# Patient Record
Sex: Male | Born: 1955 | Race: White | Hispanic: No | Smoking: Current every day smoker
Health system: Southern US, Community
[De-identification: ages and names within clinical notes are randomized; demographics above are authoritative.]

## PROBLEM LIST (undated history)

## (undated) DIAGNOSIS — I779 Disorder of arteries and arterioles, unspecified: Secondary | ICD-10-CM

## (undated) DIAGNOSIS — R011 Cardiac murmur, unspecified: Secondary | ICD-10-CM

## (undated) DIAGNOSIS — K2270 Barrett's esophagus without dysplasia: Secondary | ICD-10-CM

## (undated) DIAGNOSIS — I5022 Chronic systolic (congestive) heart failure: Secondary | ICD-10-CM

## (undated) DIAGNOSIS — R51 Headache: Secondary | ICD-10-CM

## (undated) DIAGNOSIS — I679 Cerebrovascular disease, unspecified: Secondary | ICD-10-CM

## (undated) DIAGNOSIS — J90 Pleural effusion, not elsewhere classified: Secondary | ICD-10-CM

## (undated) DIAGNOSIS — I739 Peripheral vascular disease, unspecified: Secondary | ICD-10-CM

## (undated) DIAGNOSIS — I255 Ischemic cardiomyopathy: Secondary | ICD-10-CM

## (undated) DIAGNOSIS — R0600 Dyspnea, unspecified: Secondary | ICD-10-CM

## (undated) DIAGNOSIS — Z720 Tobacco use: Secondary | ICD-10-CM

## (undated) DIAGNOSIS — Z95810 Presence of automatic (implantable) cardiac defibrillator: Secondary | ICD-10-CM

## (undated) DIAGNOSIS — R519 Headache, unspecified: Secondary | ICD-10-CM

## (undated) DIAGNOSIS — Z950 Presence of cardiac pacemaker: Secondary | ICD-10-CM

## (undated) DIAGNOSIS — T82198A Other mechanical complication of other cardiac electronic device, initial encounter: Secondary | ICD-10-CM

## (undated) DIAGNOSIS — I10 Essential (primary) hypertension: Secondary | ICD-10-CM

## (undated) DIAGNOSIS — Z87442 Personal history of urinary calculi: Secondary | ICD-10-CM

## (undated) DIAGNOSIS — E785 Hyperlipidemia, unspecified: Secondary | ICD-10-CM

## (undated) DIAGNOSIS — M1990 Unspecified osteoarthritis, unspecified site: Secondary | ICD-10-CM

## (undated) DIAGNOSIS — I2510 Atherosclerotic heart disease of native coronary artery without angina pectoris: Secondary | ICD-10-CM

## (undated) HISTORY — PX: OTHER SURGICAL HISTORY: SHX169

## (undated) HISTORY — DX: Cerebrovascular disease, unspecified: I67.9

## (undated) HISTORY — DX: Ischemic cardiomyopathy: I25.5

## (undated) HISTORY — DX: Tobacco use: Z72.0

## (undated) HISTORY — DX: Chronic systolic (congestive) heart failure: I50.22

## (undated) HISTORY — DX: Barrett's esophagus without dysplasia: K22.70

## (undated) HISTORY — DX: Atherosclerotic heart disease of native coronary artery without angina pectoris: I25.10

## (undated) HISTORY — DX: Hyperlipidemia, unspecified: E78.5

## (undated) HISTORY — DX: Presence of automatic (implantable) cardiac defibrillator: Z95.810

---

## 1997-04-20 ENCOUNTER — Encounter: Admission: RE | Admit: 1997-04-20 | Discharge: 1997-07-19 | Payer: Self-pay | Admitting: *Deleted

## 1998-01-02 HISTORY — PX: APPENDECTOMY: SHX54

## 1998-01-14 ENCOUNTER — Encounter: Payer: Self-pay | Admitting: *Deleted

## 1998-01-14 ENCOUNTER — Encounter: Payer: Self-pay | Admitting: Surgery

## 1998-01-15 ENCOUNTER — Inpatient Hospital Stay (HOSPITAL_COMMUNITY): Admission: EM | Admit: 1998-01-15 | Discharge: 1998-01-19 | Payer: Self-pay | Admitting: Emergency Medicine

## 1999-05-17 ENCOUNTER — Encounter: Admission: RE | Admit: 1999-05-17 | Discharge: 1999-08-15 | Payer: Self-pay | Admitting: Orthopedic Surgery

## 1999-08-16 ENCOUNTER — Encounter: Admission: RE | Admit: 1999-08-16 | Discharge: 1999-11-14 | Payer: Self-pay | Admitting: Orthopedic Surgery

## 1999-11-28 ENCOUNTER — Encounter: Admission: RE | Admit: 1999-11-28 | Discharge: 1999-12-01 | Payer: Self-pay | Admitting: Orthopedic Surgery

## 2002-09-29 ENCOUNTER — Encounter: Payer: Self-pay | Admitting: Emergency Medicine

## 2002-09-29 ENCOUNTER — Emergency Department (HOSPITAL_COMMUNITY): Admission: EM | Admit: 2002-09-29 | Discharge: 2002-09-29 | Payer: Self-pay | Admitting: Emergency Medicine

## 2002-10-01 ENCOUNTER — Encounter: Payer: Self-pay | Admitting: Emergency Medicine

## 2002-10-01 ENCOUNTER — Observation Stay (HOSPITAL_COMMUNITY): Admission: EM | Admit: 2002-10-01 | Discharge: 2002-10-02 | Payer: Self-pay | Admitting: Urology

## 2002-10-01 ENCOUNTER — Emergency Department (HOSPITAL_COMMUNITY): Admission: EM | Admit: 2002-10-01 | Discharge: 2002-10-01 | Payer: Self-pay | Admitting: Emergency Medicine

## 2002-10-01 ENCOUNTER — Ambulatory Visit (HOSPITAL_BASED_OUTPATIENT_CLINIC_OR_DEPARTMENT_OTHER): Admission: RE | Admit: 2002-10-01 | Discharge: 2002-10-01 | Payer: Self-pay | Admitting: Urology

## 2002-10-08 ENCOUNTER — Ambulatory Visit (HOSPITAL_BASED_OUTPATIENT_CLINIC_OR_DEPARTMENT_OTHER): Admission: RE | Admit: 2002-10-08 | Discharge: 2002-10-08 | Payer: Self-pay | Admitting: Urology

## 2003-05-07 ENCOUNTER — Encounter: Payer: Self-pay | Admitting: Emergency Medicine

## 2003-05-08 ENCOUNTER — Inpatient Hospital Stay (HOSPITAL_COMMUNITY): Admission: EM | Admit: 2003-05-08 | Discharge: 2003-05-13 | Payer: Self-pay | Admitting: Cardiology

## 2004-06-10 ENCOUNTER — Ambulatory Visit: Payer: Self-pay

## 2004-06-17 ENCOUNTER — Ambulatory Visit: Payer: Self-pay | Admitting: *Deleted

## 2004-06-17 ENCOUNTER — Inpatient Hospital Stay (HOSPITAL_BASED_OUTPATIENT_CLINIC_OR_DEPARTMENT_OTHER): Admission: RE | Admit: 2004-06-17 | Discharge: 2004-06-17 | Payer: Self-pay | Admitting: *Deleted

## 2004-06-23 ENCOUNTER — Ambulatory Visit: Payer: Self-pay | Admitting: Cardiology

## 2004-12-29 ENCOUNTER — Ambulatory Visit: Payer: Self-pay | Admitting: Cardiology

## 2005-01-05 ENCOUNTER — Ambulatory Visit: Payer: Self-pay

## 2005-01-05 ENCOUNTER — Ambulatory Visit: Payer: Self-pay | Admitting: Cardiology

## 2005-01-25 ENCOUNTER — Ambulatory Visit: Payer: Self-pay | Admitting: Cardiology

## 2005-01-30 ENCOUNTER — Ambulatory Visit: Payer: Self-pay | Admitting: Internal Medicine

## 2005-02-08 ENCOUNTER — Ambulatory Visit: Payer: Self-pay | Admitting: Internal Medicine

## 2005-02-08 ENCOUNTER — Observation Stay (HOSPITAL_COMMUNITY): Admission: AD | Admit: 2005-02-08 | Discharge: 2005-02-09 | Payer: Self-pay | Admitting: Internal Medicine

## 2005-02-22 ENCOUNTER — Ambulatory Visit: Payer: Self-pay

## 2005-03-17 ENCOUNTER — Ambulatory Visit: Payer: Self-pay | Admitting: Cardiology

## 2005-04-14 ENCOUNTER — Ambulatory Visit: Payer: Self-pay | Admitting: Cardiology

## 2005-05-09 ENCOUNTER — Ambulatory Visit: Payer: Self-pay | Admitting: Internal Medicine

## 2005-06-13 ENCOUNTER — Ambulatory Visit: Payer: Self-pay | Admitting: Cardiology

## 2005-12-28 ENCOUNTER — Ambulatory Visit: Payer: Self-pay | Admitting: Cardiology

## 2006-06-26 ENCOUNTER — Ambulatory Visit: Payer: Self-pay | Admitting: Internal Medicine

## 2006-10-31 ENCOUNTER — Ambulatory Visit: Payer: Self-pay | Admitting: Cardiology

## 2007-01-24 ENCOUNTER — Ambulatory Visit: Payer: Self-pay

## 2007-05-07 ENCOUNTER — Ambulatory Visit: Payer: Self-pay | Admitting: Internal Medicine

## 2007-07-19 ENCOUNTER — Ambulatory Visit: Payer: Self-pay | Admitting: Cardiology

## 2007-08-15 ENCOUNTER — Ambulatory Visit: Payer: Self-pay

## 2007-11-11 ENCOUNTER — Ambulatory Visit: Payer: Self-pay

## 2008-01-22 DIAGNOSIS — E785 Hyperlipidemia, unspecified: Secondary | ICD-10-CM | POA: Insufficient documentation

## 2008-01-22 DIAGNOSIS — I5022 Chronic systolic (congestive) heart failure: Secondary | ICD-10-CM

## 2008-03-12 ENCOUNTER — Encounter: Payer: Self-pay | Admitting: Internal Medicine

## 2008-04-10 ENCOUNTER — Encounter: Payer: Self-pay | Admitting: Cardiology

## 2008-04-13 ENCOUNTER — Encounter: Payer: Self-pay | Admitting: Internal Medicine

## 2008-04-13 ENCOUNTER — Ambulatory Visit: Payer: Self-pay

## 2008-05-12 ENCOUNTER — Encounter (INDEPENDENT_AMBULATORY_CARE_PROVIDER_SITE_OTHER): Payer: Self-pay | Admitting: *Deleted

## 2008-05-15 ENCOUNTER — Ambulatory Visit: Payer: Self-pay | Admitting: Cardiology

## 2008-05-15 DIAGNOSIS — I739 Peripheral vascular disease, unspecified: Secondary | ICD-10-CM | POA: Insufficient documentation

## 2008-05-15 DIAGNOSIS — I679 Cerebrovascular disease, unspecified: Secondary | ICD-10-CM | POA: Insufficient documentation

## 2008-05-15 DIAGNOSIS — I2589 Other forms of chronic ischemic heart disease: Secondary | ICD-10-CM

## 2008-05-15 DIAGNOSIS — R079 Chest pain, unspecified: Secondary | ICD-10-CM

## 2008-05-15 DIAGNOSIS — F17200 Nicotine dependence, unspecified, uncomplicated: Secondary | ICD-10-CM | POA: Insufficient documentation

## 2008-05-15 DIAGNOSIS — E119 Type 2 diabetes mellitus without complications: Secondary | ICD-10-CM

## 2008-05-15 LAB — CONVERTED CEMR LAB
AST: 17 units/L (ref 0–37)
BUN: 15 mg/dL (ref 6–23)
CO2: 29 meq/L (ref 19–32)
Calcium: 9.2 mg/dL (ref 8.4–10.5)
Chloride: 103 meq/L (ref 96–112)
Cholesterol, target level: 200 mg/dL
Glucose, Bld: 218 mg/dL — ABNORMAL HIGH (ref 70–99)
HDL goal, serum: 40 mg/dL
LDL Goal: 70 mg/dL
Potassium: 4 meq/L (ref 3.5–5.1)
Sodium: 137 meq/L (ref 135–145)
Total Bilirubin: 0.6 mg/dL (ref 0.3–1.2)

## 2008-05-18 ENCOUNTER — Encounter (INDEPENDENT_AMBULATORY_CARE_PROVIDER_SITE_OTHER): Payer: Self-pay | Admitting: *Deleted

## 2008-06-02 ENCOUNTER — Telehealth (INDEPENDENT_AMBULATORY_CARE_PROVIDER_SITE_OTHER): Payer: Self-pay | Admitting: Radiology

## 2008-06-03 ENCOUNTER — Encounter: Payer: Self-pay | Admitting: Cardiology

## 2008-06-03 ENCOUNTER — Ambulatory Visit: Payer: Self-pay

## 2008-06-25 ENCOUNTER — Ambulatory Visit: Payer: Self-pay

## 2008-06-25 ENCOUNTER — Encounter: Payer: Self-pay | Admitting: Internal Medicine

## 2008-08-27 ENCOUNTER — Encounter (INDEPENDENT_AMBULATORY_CARE_PROVIDER_SITE_OTHER): Payer: Self-pay | Admitting: *Deleted

## 2008-09-25 ENCOUNTER — Telehealth: Payer: Self-pay | Admitting: Cardiology

## 2008-09-29 ENCOUNTER — Ambulatory Visit: Payer: Self-pay | Admitting: Internal Medicine

## 2008-12-16 ENCOUNTER — Ambulatory Visit: Payer: Self-pay

## 2008-12-16 ENCOUNTER — Encounter: Payer: Self-pay | Admitting: Internal Medicine

## 2009-01-20 ENCOUNTER — Encounter: Payer: Self-pay | Admitting: Cardiology

## 2009-01-21 ENCOUNTER — Ambulatory Visit: Payer: Self-pay

## 2009-01-21 ENCOUNTER — Ambulatory Visit: Payer: Self-pay | Admitting: Cardiology

## 2009-01-21 DIAGNOSIS — I2510 Atherosclerotic heart disease of native coronary artery without angina pectoris: Secondary | ICD-10-CM

## 2009-01-27 LAB — CONVERTED CEMR LAB
ALT: 20 units/L (ref 0–53)
Albumin: 4.2 g/dL (ref 3.5–5.2)
BUN: 9 mg/dL (ref 6–23)
CO2: 27 meq/L (ref 19–32)
Calcium: 9.4 mg/dL (ref 8.4–10.5)
Creatinine, Ser: 0.8 mg/dL (ref 0.4–1.5)
Direct LDL: 184.1 mg/dL
GFR calc non Af Amer: 106.97 mL/min (ref >60)
Glucose, Bld: 247 mg/dL — ABNORMAL HIGH (ref 70–99)
HDL: 39.3 mg/dL (ref >39.00)
Potassium: 3.9 meq/L (ref 3.5–5.1)
Total Bilirubin: 0.8 mg/dL (ref 0.3–1.2)
Total CHOL/HDL Ratio: 6
Total Protein: 7.5 g/dL (ref 6.0–8.3)
VLDL: 23.4 mg/dL (ref 0.0–40.0)

## 2009-02-17 ENCOUNTER — Encounter: Payer: Self-pay | Admitting: Cardiology

## 2009-02-17 ENCOUNTER — Ambulatory Visit: Payer: Self-pay

## 2009-03-23 ENCOUNTER — Ambulatory Visit: Payer: Self-pay | Admitting: Internal Medicine

## 2009-03-23 DIAGNOSIS — R42 Dizziness and giddiness: Secondary | ICD-10-CM

## 2009-06-10 ENCOUNTER — Encounter: Payer: Self-pay | Admitting: Internal Medicine

## 2009-06-10 ENCOUNTER — Ambulatory Visit: Payer: Self-pay

## 2009-08-13 ENCOUNTER — Ambulatory Visit: Payer: Self-pay | Admitting: Cardiology

## 2009-08-17 ENCOUNTER — Encounter (INDEPENDENT_AMBULATORY_CARE_PROVIDER_SITE_OTHER): Payer: Self-pay | Admitting: *Deleted

## 2009-08-17 LAB — CONVERTED CEMR LAB
ALT: 11 units/L (ref 0–53)
Albumin: 4.2 g/dL (ref 3.5–5.2)
CO2: 30 meq/L (ref 19–32)
Calcium: 9.6 mg/dL (ref 8.4–10.5)
Direct LDL: 158.4 mg/dL
GFR calc non Af Amer: 120.55 mL/min (ref >60)
Potassium: 4.5 meq/L (ref 3.5–5.1)
Total Bilirubin: 0.5 mg/dL (ref 0.3–1.2)
Total CHOL/HDL Ratio: 6
Triglycerides: 122 mg/dL (ref 0.0–149.0)

## 2009-08-27 ENCOUNTER — Ambulatory Visit: Payer: Self-pay

## 2009-08-27 ENCOUNTER — Encounter: Payer: Self-pay | Admitting: Cardiology

## 2009-09-21 ENCOUNTER — Ambulatory Visit: Payer: Self-pay | Admitting: Internal Medicine

## 2009-12-15 ENCOUNTER — Ambulatory Visit: Payer: Self-pay

## 2009-12-15 ENCOUNTER — Encounter: Payer: Self-pay | Admitting: Internal Medicine

## 2010-02-01 NOTE — Miscellaneous (Signed)
Summary: Orders Update  Clinical Lists Changes  Orders: Added new Test order of Carotid Duplex (Carotid Duplex) - Signed 

## 2010-02-01 NOTE — Assessment & Plan Note (Signed)
Summary: PER CHECKOUT/SF    History of Present Illness: [name] is a  gentleman who has a history of coronary artery disease and ischemic cardiomyopathy as well as a prior ICD. The patient has had previous three-vessel stentingl. His last Myoview was performed in June 2010.Marland Kitchen At that time there was a prior extensive scar in the basal inferior wall, apex and mid/distal anteroseptal wall. There was no ischemia. His ejection fraction was 31%. Last carotid Dopplers were performed in June 2010. At that time he had 60-79% bilateral stenosis with the right at the high end of range. Since I last saw him in May of 2010. Since then the patient has dyspnea with more extreme activities and not with routine activities. It is relieved with rest. It is not associated with chest pain. There is no orthopnea, PND or pedal edema. There is no syncope or palpitations. There is no exertional chest pain.   Current Medications (verified): 1)  Aspirin Ec 325 Mg Tbec (Aspirin) .... Take One Tablet By Mouth Daily 2)  Benazepril Hcl 20 Mg Tabs (Benazepril Hcl) .Marland Kitchen.. 1 Once Daily 3)  Carvedilol 6.25 Mg Tabs (Carvedilol) .... Take One Tablet By Mouth Twice A Day 4)  Lipitor 80 Mg Tabs (Atorvastatin Calcium) .... Take One Tablet By Mouth Daily. 5)  Glucophage 1000 Mg Tabs (Metformin Hcl) .Marland Kitchen.. 1 Two Times A Day  Allergies: 1)  ! Niacin  Past History:  Past Medical History: Reviewed history from 05/15/2008 and no changes required. Current Problems:  HYPERLIPIDEMIA-MIXED (ICD-272.4) SYSTOLIC HEART FAILURE, CHRONIC (ICD-428.22) ICD - IN SITU (ICD-V45.02) [employer]. JUDE FOR SEVERE LVD EF 25% 2/07 CAD, NATIVE VESSEL (ICD-414.01)S/P AWMI '05 STENT LAD, RCA, & CFX. LAST CATH'06 MOD RESTENOSIS LAD STENT, OTHERS PATENT TOBACCO ABUSE Ischemic cardiomyopathy Cerebrovascular disease. Diabetes mellitus  Social History: Reviewed history from 01/22/2008 and no changes required. Full Time Divorced  Tobacco Use - Yes. 1PPD X 33  YRS Alcohol Use - no Drug Use - yes Cocain,Acid & Marijuana 25 yrs ago  Review of Systems       Problems with occasional leg cramping but no fevers or chills, productive cough, hemoptysis, dysphasia, odynophagia, melena, hematochezia, dysuria, hematuria, rash, seizure activity, orthopnea, PND, pedal edema, claudication. Remaining systems are negative.   Vital Signs:  Patient profile:   55 year old male Height:      72 inches Weight:      221 pounds BMI:     30.08 Pulse rate:   92 / minute Resp:     12 per minute BP sitting:   139 / 81  (left arm)  Vitals Entered By: [name] (January 21, 2009 8:12 AM)  Physical Exam  General:  Well-developed well-nourished in no acute distress.  Skin is warm and dry.  HEENT is normal.  Neck is supple. No thyromegaly.  Chest is clear to auscultation with normal expansion.  Cardiovascular exam is regular rate and rhythm.  Abdominal exam nontender or distended. No masses palpated. mid abdominal bruit Extremities show no edema. distal pulses not palpated. Chronic skin changes. neuro grossly intact    EKG  Procedure date:  01/21/2009  Findings:      Sinus rhythm at a rate of 83. Axis normal. Prior anterior and inferior infarct.   ICD Specifications Following MD:  Dalton Manges, MD     Referring MD:  [name] ICD Vendor:  [facility]     ICD Model Number:  [phone]     ICD Serial Number:  [id number] ICD DOI:  02/08/2005     ICD Implanting MD:  Dalton Manges, MD  Lead 1:    Location: RV     DOI: 02/08/2005     Model #: [id number]     Serial #: [id number]     Status: active  Indications::  ISCHEMIC HEART DISEASE   ICD Follow Up ICD Dependent:  No      Episodes Coumadin:  No  Brady Parameters Mode VVI     Lower Rate Limit:  40      Tachy Zones VF:  240     VT:  211     VT1:  182     Impression & Recommendations:  Problem # 1:  ICD - [facility] [place] (ICD-V45.02) Management per EP.  Problem # 2:  CARDIOMYOPATHY, ISCHEMIC (ICD-414.8)  Continue  aspirin, beta blocker, ACE inhibitor and statin. I considered increasing his Coreg he states his blood pressure typically runs with a systolic of 90-100 and higher doses of Coreg have made him dizzy in the past. His updated medication list for this problem includes:    Aspirin Ec 325 Mg Tbec (Aspirin) .Marland Kitchen... Take one tablet by mouth daily    Benazepril Hcl 20 Mg Tabs (Benazepril hcl) .Marland Kitchen... 1 once daily    Carvedilol 6.25 Mg Tabs (Carvedilol) .Marland Kitchen... Take one tablet by mouth twice a day  His updated medication list for this problem includes:    Aspirin Ec 325 Mg Tbec (Aspirin) .Marland Kitchen... Take one tablet by mouth daily    Benazepril Hcl 20 Mg Tabs (Benazepril hcl) .Marland Kitchen... 1 once daily    Carvedilol 6.25 Mg Tabs (Carvedilol) .Marland Kitchen... Take one tablet by mouth twice a day  Problem # 3:  PVD (ICD-443.9) Schedule abdominal ultrasound for bruit and ABIs with Doppler for peripheral vascular disease. Continue aspirin and statin.  Problem # 4:  TOBACCO ABUSE (ICD-305.1) Patient counseled on discontinuing for between 3-10 minutes.  Problem # 5:  CEREBROVASCULAR DISEASE (ICD-437.9) Continue aspirin and statin. Await followup carotid Dopplers performed today.  Problem # 6:  HYPERLIPIDEMIA-MIXED (ICD-272.4)  Continue statin. Check lipids and liver. Will also check bmet givenACE inhibitor use. His updated medication list for this problem includes:    Lipitor 80 Mg Tabs (Atorvastatin calcium) .Marland Kitchen... Take one tablet by mouth daily.  His updated medication list for this problem includes:    Lipitor 80 Mg Tabs (Atorvastatin calcium) .Marland Kitchen... Take one tablet by mouth daily.  Problem # 7:  AODM (ICD-250.00)  His updated medication list for this problem includes:    Aspirin Ec 325 Mg Tbec (Aspirin) .Marland Kitchen... Take one tablet by mouth daily    Benazepril Hcl 20 Mg Tabs (Benazepril hcl) .Marland Kitchen... 1 once daily    Glucophage 1000 Mg Tabs (Metformin hcl) .Marland Kitchen... 1 two times a day  His updated medication list for this problem  includes:    Aspirin Ec 325 Mg Tbec (Aspirin) .Marland Kitchen... Take one tablet by mouth daily    Benazepril Hcl 20 Mg Tabs (Benazepril hcl) .Marland Kitchen... 1 once daily    Glucophage 1000 Mg Tabs (Metformin hcl) .Marland Kitchen... 1 two times a day  Problem # 8:  CAD (ICD-414.00)  Continue aspirin, beta blocker, ACE inhibitor and statin. Last Myoview showed scar but no ischemia. His updated medication list for this problem includes:    Aspirin Ec 325 Mg Tbec (Aspirin) .Marland Kitchen... Take one tablet by mouth daily    Benazepril Hcl 20 Mg Tabs (Benazepril hcl) .Marland Kitchen... 1 once daily    Carvedilol 6.25 Mg Tabs (Carvedilol) .Marland KitchenMarland KitchenMarland KitchenMarland Kitchen  Take one tablet by mouth twice a day  His updated medication list for this problem includes:    Aspirin Ec 325 Mg Tbec (Aspirin) .Marland Kitchen... Take one tablet by mouth daily    Benazepril Hcl 20 Mg Tabs (Benazepril hcl) .Marland Kitchen... 1 once daily    Carvedilol 6.25 Mg Tabs (Carvedilol) .Marland Kitchen... Take one tablet by mouth twice a day  Other Orders: Abdominal Aorta Duplex (Abd Aorta Duplex)  Patient Instructions: 1)  Your physician recommends that you schedule a follow-up appointment in: 6 months 2)  Your physician has requested that you have an ankle brachial index (ABI). During this test an ultrasound and blood pressure cuff are used to evaluate the arteries that supply the arms and legs with blood. Allow thirty minutes for this exam. There are no restrictions or special instructions. 3)  Your physician has requested that you have an abdominal aorta duplex. During this test, an ultrasound is used to evaluate the aorta. Allow 30 minutes for this exam. Do not eat after midnight the day before and avoid carbonated beverages. There are no restrictions or special instructions. 4)  Your physician recommends that you return for lab work NU:UVOZD BMET, Lipid, LFT

## 2010-02-01 NOTE — Cardiovascular Report (Signed)
Summary: Office Visit   Office Visit   Imported By: Roderic Ovens 09/21/2009 14:36:18  _____________________________________________________________________  External Attachment:    Type:   Image     Comment:   External Document

## 2010-02-01 NOTE — Cardiovascular Report (Signed)
Summary: Office Visit   Office Visit   Imported By: Roderic Ovens 06/18/2009 14:37:10  _____________________________________________________________________  External Attachment:    Type:   Image     Comment:   External Document

## 2010-02-01 NOTE — Cardiovascular Report (Signed)
Summary: Office Visit   Office Visit   Imported By: Roderic Ovens 01/07/2009 16:18:10  _____________________________________________________________________  External Attachment:    Type:   Image     Comment:   External Document

## 2010-02-01 NOTE — Assessment & Plan Note (Signed)
Summary: per check out/sf    CC:  check up.  History of Present Illness: Mr. Hilgert is a  gentleman who has a history of coronary artery disease and ischemic cardiomyopathy as well as a prior ICD. The patient has had previous three-vessel stenting. His last Myoview was performed in June 2010. At that time there was a prior extensive scar in the basal inferior wall, apex and mid/distal anteroseptal wall. There was no ischemia. His ejection fraction was 31%. Last carotid Dopplers were performed in Jan 2011. At that time he had 60-79% bilateral stenosis with the right at the high end of range. F/u recommended in 6 months. Last ABIs were performed in February 2011. The patient had a greater than 50% right SFA stenosis and less than 50% left SFA stenosis. ABI on the right was mild and on the left was normal. Abdominal ultrasound in February 2011 showed no aneurysm.  I last saw him in Jan 2011. Since then the patient has dyspnea with more extreme activities but not with routine activities. It is relieved with rest. It is not associated with chest pain. There is no orthopnea, PND or pedal edema. There is no syncope or palpitations. There is no exertional chest pain.  Current Medications (verified): 1)  Aspirin Ec 325 Mg Tbec (Aspirin) .... Take One Tablet By Mouth Daily 2)  Benazepril Hcl 10 Mg Tabs (Benazepril Hcl) .... Take 1 Tablet By Mouth Once A Day 3)  Carvedilol 3.125 Mg Tabs (Carvedilol) .... Take One Tablet By Mouth Twice A Day 4)  Lipitor 80 Mg Tabs (Atorvastatin Calcium) .... Take One Tablet By Mouth Daily. 5)  Glucophage 1000 Mg Tabs (Metformin Hcl) .Marland Kitchen.. 1 Two Times A Day  Allergies: 1)  ! Niacin  Past History:  Past Medical History: HYPERLIPIDEMIA-MIXED (ICD-272.4) SYSTOLIC HEART FAILURE, CHRONIC (ICD-428.22) ICD - IN SITU (ICD-V45.02) ST. JUDE FOR SEVERE LVD EF 25% 2/07-explanted/2010  -Medtronic Virtuoso II DR dual-chamber       cardioverter-defibrillator with pocket revision, Dr.  Sherryl Manges CAD, NATIVE VESSEL (ICD-414.01)S/P AWMI '05 STENT LAD, RCA, & CFX. LAST CATH'06 MOD RESTENOSIS LAD STENT, OTHERS PATENT TOBACCO ABUSE Ischemic cardiomyopathy Cerebrovascular disease. Diabetes mellitus  Past Surgical History: Reviewed history from 03/22/2009 and no changes required.  Medtronic Virtuoso II DR dual-chamber       cardioverter-defibrillator with pocket revision, Dr. Sherryl Manges  Social History: Reviewed history from 01/22/2008 and no changes required. Full Time Divorced  Tobacco Use - Yes. 1PPD X 33 YRS Alcohol Use - no Drug Use - yes Cocain,Acid & Marijuana 25 yrs ago  Review of Systems       Sinus problems but no fevers or chills, productive cough, hemoptysis, dysphasia, odynophagia, melena, hematochezia, dysuria, hematuria, rash, seizure activity, orthopnea, PND, pedal edema, claudication. Remaining systems are negative.   Vital Signs:  Patient profile:   55 year old male Height:      72 inches Weight:      210 pounds BMI:     28.58 Pulse rate:   84 / minute Pulse rhythm:   regular Resp:     12 per minute BP sitting:   132 / 80  (left arm)  Vitals Entered By: Kem Parkinson (August 13, 2009 8:23 AM)  Physical Exam  General:  Well-developed well-nourished in no acute distress.  Skin is warm and dry.  HEENT is normal.  Neck is supple. No thyromegaly.  Chest is clear to auscultation with normal expansion.  Cardiovascular exam is regular rate and rhythm.  Abdominal exam nontender or distended. No masses palpated. Extremities show no edema. neuro grossly intact    EKG  Procedure date:  08/13/2009  Findings:      Sinus rhythm at a rate of 84. Axis normal. Prior anterior and inferior infarct.   ICD Specifications Following MD:  Sherryl Manges, MD     Referring MD:  Jens Som ICD Vendor:  [employer]     ICD Model Number:  [phone]     ICD Serial Number:  [id number] ICD DOI:  02/08/2005     ICD Implanting MD:  Sherryl Manges, MD  Lead 1:     Location: RV     DOI: 02/08/2005     Model #: 7001     Serial #: [id number]     Status: active  Indications::  ISCHEMIC HEART DISEASE   ICD Follow Up ICD Dependent:  No      Episodes Coumadin:  No  Brady Parameters Mode VVI     Lower Rate Limit:  40      Tachy Zones VF:  240     VT:  211     VT1:  182     Impression & Recommendations:  Problem # 1:  CAD (ICD-414.00)  Continue aspirin, ACE inhibitor, beta blocker and statin. His updated medication list for this problem includes:    Aspirin Ec 325 Mg Tbec (Aspirin) .Marland Kitchen... Take one tablet by mouth daily    Benazepril Hcl 10 Mg Tabs (Benazepril hcl) .Marland Kitchen... Take 1 tablet by mouth once a day    Carvedilol 3.125 Mg Tabs (Carvedilol) .Marland Kitchen... Take one tablet by mouth twice a day  His updated medication list for this problem includes:    Aspirin Ec 325 Mg Tbec (Aspirin) .Marland Kitchen... Take one tablet by mouth daily    Benazepril Hcl 10 Mg Tabs (Benazepril hcl) .Marland Kitchen... Take 1 tablet by mouth once a day    Carvedilol 3.125 Mg Tabs (Carvedilol) .Marland Kitchen... Take one tablet by mouth twice a day  Problem # 2:  ICD - [facility] [place] (ICD-V45.02) Management per EP.  Problem # 3:  CARDIOMYOPATHY, ISCHEMIC (ICD-414.8) Continue ACE inhibitor and beta blocker. I will not advance his medications as he has had problems with dizziness and hypotension previously. His updated medication list for this problem includes:    Aspirin Ec 325 Mg Tbec (Aspirin) .Marland Kitchen... Take one tablet by mouth daily    Benazepril Hcl 10 Mg Tabs (Benazepril hcl) .Marland Kitchen... Take 1 tablet by mouth once a day    Carvedilol 3.125 Mg Tabs (Carvedilol) .Marland Kitchen... Take one tablet by mouth twice a day  Orders: TLB-BMP (Basic Metabolic Panel-BMET) (80048-METABOL)  Problem # 4:  PVD (ICD-443.9) No significant claudication. Continue aspirin and statin. Discontinue tobacco.  Problem # 5:  TOBACCO ABUSE (ICD-305.1) Patient counseled on discontinuing.  Problem # 6:  AODM (ICD-250.00)  His updated medication list for this  problem includes:    Aspirin Ec 325 Mg Tbec (Aspirin) .Marland Kitchen... Take one tablet by mouth daily    Benazepril Hcl 10 Mg Tabs (Benazepril hcl) .Marland Kitchen... Take 1 tablet by mouth once a day    Glucophage 1000 Mg Tabs (Metformin hcl) .Marland Kitchen... 1 two times a day  Problem # 7:  CEREBROVASCULAR DISEASE (ICD-437.9) Continue aspirin and statin. Scheduled followup carotid Dopplers. Orders: Carotid Duplex (Carotid Duplex)  Problem # 8:  HYPERLIPIDEMIA-MIXED (ICD-272.4) Continue statin. Check lipids and liver. His updated medication list for this problem includes:    Lipitor 80 Mg Tabs (Atorvastatin calcium) .Marland KitchenMarland KitchenMarland KitchenMarland Kitchen  Take one tablet by mouth daily.  Orders: TLB-Lipid Panel (80061-LIPID) TLB-Hepatic/Liver Function Pnl (80076-HEPATIC)  Patient Instructions: 1)  Your physician recommends that you schedule a follow-up appointment in: ONE YEAR 2)  Your physician has requested that you have a carotid duplex. This test is an ultrasound of the carotid arteries in your neck. It looks at blood flow through these arteries that supply the brain with blood. Allow one hour for this exam. There are no restrictions or special instructions.

## 2010-02-01 NOTE — Letter (Signed)
Summary: Custom - Lipid  [facility], Main Office  [address]   [place], [place] [address]   Phone: [phone]  Fax: [phone]     August 17, 2009 MRN: [id number]   [name] [address] [place], [place]  [address]   Dear [name],  We have reviewed your cholesterol results.  They are as follows:     Total Cholesterol:    203 (Desirable: less than 200)       HDL  Cholesterol:     35.50  (Desirable: greater than 40 for men and 50 for women)       LDL Cholesterol:       75  (Desirable: less than 100 for low risk and less than 70 for moderate to high risk)       Triglycerides:       122.0  (Desirable: less than 150)  Our recommendations include:These numbers are alittle high. Please give me a call so we call discuss these labs and your medicine. Thanks, Dr Darel Hong   Call our office at the number listed above if you have any questions.  Lowering your LDL cholesterol is important, but it is only one of a large number of "risk factors" that may indicate that you are at risk for heart disease, stroke or other complications of hardening of the arteries.  Other risk factors include:   A.  Cigarette Smoking* B.  High Blood Pressure* C.  Obesity* D.   Low HDL Cholesterol (see yours above)* E.   Diabetes Mellitus (higher risk if your is uncontrolled) F.  Family history of premature heart disease G.  Previous history of stroke or cardiovascular disease    *These are risk factors YOU HAVE CONTROL OVER.  For more information, visit .  There is now evidence that lowering the TOTAL CHOLESTEROL AND LDL CHOLESTEROL can reduce the risk of heart disease.  The American Heart Association recommends the following guidelines for the treatment of elevated cholesterol:  1.  If there is now current heart disease and less than two risk factors, TOTAL CHOLESTEROL should be less than 200 and LDL CHOLESTEROL should be less than 100. 2.  If there is current heart  disease or two or more risk factors, TOTAL CHOLESTEROL should be less than 200 and LDL CHOLESTEROL should be less than 70.  A diet low in cholesterol, saturated fat, and calories is the cornerstone of treatment for elevated cholesterol.  Cessation of smoking and exercise are also important in the management of elevated cholesterol and preventing vascular disease.  Studies have shown that 30 to 60 minutes of physical activity most days can help lower blood pressure, lower cholesterol, and keep your weight at a healthy level.  Drug therapy is used when cholesterol levels do not respond to therapeutic lifestyle changes (smoking cessation, diet, and exercise) and remains unacceptably high.  If medication is started, it is important to have you levels checked periodically to evaluate the need for further treatment options.  Thank you,    [employer] Team

## 2010-02-01 NOTE — Cardiovascular Report (Signed)
Summary: Office Visit   Office Visit   Imported By: Roderic Ovens 03/24/2009 14:16:15  _____________________________________________________________________  External Attachment:    Type:   Image     Comment:   External Document

## 2010-02-01 NOTE — Assessment & Plan Note (Signed)
Summary: defib check.sjm.amber      Allergies Added:   History of Present Illness: [name] is seen in follow up for  ischemic cardiomyopathy and a preveiously implanted    ICD. The patient denies SOB, chest pain, edema or palpitations STress diminished in home situation   The patient has had previous three-vessel stenting. His last Myoview was performed in June 2010. At that time there was a prior extensive scar in the basal inferior wall, apex and mid/distal anteroseptal wall. There was no ischemia. His ejection fraction was 31%.  Last carotid Dopplers were performed in Jan 2011. At that time he had 60-79% bilateral stenosis with the right at the high end of range. F/u recommended in 6 months.  Last ABIs were performed in February 2011. The patient had a greater than 50% right SFA stenosis and less than 50% left SFA stenosis. ABI on the right was mild and on the left was normal.   Current Medications (verified): 1)  Aspirin Ec 325 Mg Tbec (Aspirin) .... Take One Tablet By Mouth Daily 2)  Benazepril Hcl 10 Mg Tabs (Benazepril Hcl) .... Take 1 Tablet By Mouth Once A Day 3)  Carvedilol 3.125 Mg Tabs (Carvedilol) .... Take One Tablet By Mouth Twice A Day 4)  Lipitor 80 Mg Tabs (Atorvastatin Calcium) .... Take One Tablet By Mouth Daily. 5)  Glucophage 1000 Mg Tabs (Metformin Hcl) .Marland Kitchen.. 1 Two Times A Day  Allergies (verified): 1)  ! Niacin  Past History:  Past Medical History: Last updated: 08/13/2009 HYPERLIPIDEMIA-MIXED (ICD-272.4) SYSTOLIC HEART FAILURE, CHRONIC (ICD-428.22) ICD - IN SITU (ICD-V45.02) ST. JUDE FOR SEVERE LVD EF 25% 2/07-explanted/2010  -Medtronic Virtuoso II DR dual-chamber       cardioverter-defibrillator with pocket revision, Dr. Sherryl Manges CAD, NATIVE VESSEL (ICD-414.01)S/P AWMI '05 STENT LAD, RCA, & CFX. LAST CATH'06 MOD RESTENOSIS LAD STENT, OTHERS PATENT TOBACCO ABUSE Ischemic cardiomyopathy Cerebrovascular disease. Diabetes mellitus  Vital  Signs:  Patient profile:   55 year old male Height:      72 inches Weight:      220 pounds Pulse rate:   88 / minute BP sitting:   154 / 86  (right arm) Cuff size:   large  Vitals Entered By: Laurance Flatten CMA (September 21, 2009 9:09 AM)  Physical Exam  General:  The patient was alert and oriented in no acute distress. HEENT Normal.  Neck veins were flat, carotids were brisk.  Lungs were clear.  Heart sounds were regular without murmurs or gallops.  Abdomen was soft with active bowel sounds. There is no clubbing cyanosis or edema. Skin Warm and dry     ICD Specifications Following MD:  Sherryl Manges, MD     Referring MD:  Jens Som ICD Vendor:  St Jude     ICD Model Number:  [phone]     ICD Serial Number:  [id number] ICD DOI:  02/08/2005     ICD Implanting MD:  Sherryl Manges, MD  Lead 1:    Location: RV     DOI: 02/08/2005     Model #: 7001     Serial #: [id number]     Status: active  Indications::  ISCHEMIC HEART DISEASE   ICD Follow Up Remote Check?  No Battery Voltage:  2.6 V     Charge Time:  11.8 seconds     Underlying rhythm:  SR ICD Dependent:  No       ICD Device Measurements Right Ventricle:  Amplitude: 9.9 mV, Impedance: 520 ohms,  Threshold: 0.75 V at 0.5 msec  Episodes Coumadin:  No Shock:  0     ATP:  0     Nonsustained:  0      Brady Parameters Mode VVI     Lower Rate Limit:  40      Tachy Zones VF:  240     VT:  211     VT1:  182     Next Cardiology Appt Due:  12/02/2009 Tech Comments:  No parameter changes.  Device function normal.  ROV 3 months clinic. Altha Harm, LPN  September 21, 2009 9:22 AM   Impression & Recommendations:  Problem # 1:  SYSTOLIC HEART FAILURE, CHRONIC (ICD-428.22)  stable,  meds refilled His updated medication list for this problem includes:    Aspirin Ec 325 Mg Tbec (Aspirin) .Marland Kitchen... Take one tablet by mouth daily    Benazepril Hcl 10 Mg Tabs (Benazepril hcl) .Marland Kitchen... Take 1 tablet by mouth once a day    Carvedilol 3.125 Mg Tabs  (Carvedilol) .Marland Kitchen... Take one tablet by mouth twice a day  His updated medication list for this problem includes:    Aspirin Ec 325 Mg Tbec (Aspirin) .Marland Kitchen... Take one tablet by mouth daily    Benazepril Hcl 10 Mg Tabs (Benazepril hcl) .Marland Kitchen... Take 1 tablet by mouth once a day    Carvedilol 3.125 Mg Tabs (Carvedilol) .Marland Kitchen... Take one tablet by mouth twice a day  Problem # 2:  ICD - [facility] [place] (ICD-V45.02) Device parameters and data were reviewed and no changes were made  Problem # 3:  CARDIOMYOPATHY, ISCHEMIC (ICD-414.8) stable His updated medication list for this problem includes:    Aspirin Ec 325 Mg Tbec (Aspirin) .Marland Kitchen... Take one tablet by mouth daily    Benazepril Hcl 10 Mg Tabs (Benazepril hcl) .Marland Kitchen... Take 1 tablet by mouth once a day    Carvedilol 3.125 Mg Tabs (Carvedilol) .Marland Kitchen... Take one tablet by mouth twice a day  Patient Instructions: 1)  Your physician recommends that you schedule a follow-up appointment in: 3 months with Belenda Cruise and Gunnar Fusi 2)  Your physician recommends that you continue on your current medications as directed. Please refer to the Current Medication list given to you today. Prescriptions: LIPITOR 80 MG TABS (ATORVASTATIN CALCIUM) Take one tablet by mouth daily.  #90 x 3   Entered by:   Claris Gladden RN   Authorized by:   Nathen May, MD, [facility]   Signed by:   Claris Gladden RN on 09/21/2009   Method used:   Electronically to        [facility]* (retail)       [address] [place], [place]  [address]       Ph: [phone]       Fax: [phone]   RxID:   [id number] GLUCOPHAGE 1000 MG TABS (METFORMIN HCL) 1 two times a day  #120 x 3   Entered by:   Claris Gladden RN   Authorized by:   Nathen May, MD, [facility]   Signed by:   Claris Gladden RN on 09/21/2009   Method used:   Electronically to        [facility]* (retail)       [address] [place], [place]  [address]  Ph: [phone]       Fax: [phone]   RxID:   [id number] BENAZEPRIL HCL 10 MG TABS (BENAZEPRIL HCL) Take 1 tablet by mouth once a day  #90 x 3   Entered by:   Claris Gladden RN   Authorized by:   Nathen May, MD, [facility]   Signed by:   Claris Gladden RN on 09/21/2009   Method used:   Electronically to        [facility]* (retail)       [address] [place], [place]  [address]       Ph: [phone]       Fax: [phone]   RxID:   [id number] CARVEDILOL 3.125 MG TABS (CARVEDILOL) Take one tablet by mouth twice a day  #120 x 3   Entered by:   Claris Gladden RN   Authorized by:   Nathen May, MD, [facility]   Signed by:   Claris Gladden RN on 09/21/2009   Method used:   Electronically to        [facility]* (retail)       [address] [place], [place]  [address]       Ph: [phone]       Fax: [phone]   RxID:   [id number]

## 2010-02-01 NOTE — Procedures (Signed)
Summary: device/saf      Allergies Added:   Current Medications (verified): 1)  Aspirin Ec 325 Mg Tbec (Aspirin) .... Take One Tablet By Mouth Daily 2)  Benazepril Hcl 10 Mg Tabs (Benazepril Hcl) .... Take 1 Tablet By Mouth Once A Day 3)  Carvedilol 3.125 Mg Tabs (Carvedilol) .... Take One Tablet By Mouth Twice A Day 4)  Lipitor 80 Mg Tabs (Atorvastatin Calcium) .... Take One Tablet By Mouth Daily. 5)  Glucophage 1000 Mg Tabs (Metformin Hcl) .Marland Kitchen.. 1 Two Times A Day  Allergies (verified): 1)  ! Niacin   ICD Specifications Following MD:  Dalton Manges, MD     Referring MD:  [name] ICD Vendor:  St Jude     ICD Model Number:  [phone]     ICD Serial Number:  [id number] ICD DOI:  02/08/2005     ICD Implanting MD:  Dalton Manges, MD  Lead 1:    Location: RV     DOI: 02/08/2005     Model #: 7001     Serial #: [id number]     Status: active  Indications::  ISCHEMIC HEART DISEASE   ICD Follow Up Battery Voltage:  2.63 V     Charge Time:  11.5 seconds     Underlying rhythm:  SR @ 89 ICD Dependent:  No       ICD Device Measurements Right Ventricle:  Amplitude: 9.2 mV, Impedance: 520 ohms, Threshold: 0.75 V at 0.5 msec Shock Impedance: 36 ohms   Episodes MS Episodes:  0     Percent Mode Switch:  0     Coumadin:  No Shock:  0     ATP:  0     Nonsustained:  0     Atrial Therapies:  0 Ventricular Pacing:  <1%  Brady Parameters Mode VVI     Lower Rate Limit:  40      Tachy Zones VF:  240     VT:  211     VT1:  182     Next Cardiology Appt Due:  09/02/2009 Tech Comments:  NORMAL DEVICE FUNCTION.  NO CHANGES MADE. ROV IN 3 MTHS.  PT HAS MERLIN TRANSMITTER BUT UNABLE TO USE.  [name]  June 10, 2009 9:24 AM

## 2010-02-01 NOTE — Assessment & Plan Note (Signed)
Summary: pc2 st jude  Medications Added BENAZEPRIL HCL 10 MG TABS (BENAZEPRIL HCL) Take 1 tablet by mouth once a day CARVEDILOL 3.125 MG TABS (CARVEDILOL) Take one tablet by mouth twice a day      Allergies Added:   CC:  device check.  History of Present Illness: Mr. Henegar is a 55 year old gentleman who has a history of coronary artery disease and ischemic cardiomyopathy as well as a prior ICD. T His last Myoview was performed in January of 2007. At that time there was a prior extensive scar in the inferior wall, apex and mid/distal anteroseptal wall. There was a question of peri-infarct ischemia but I did review this and felt there was no significant ischemia. We have been treating this medically. His ejection fraction was 33%.    His major complaint is dizziness associated with looking up. He thinks it's related to carvedilol. On discussing with his medication he takes is carvedilol once a day. He takes it and his ACE inhibitor prior to going to bed. I should note that he works third shift. It is his impression that this did not occur in the year that he has not taken all his medications. The patient denies SOB, chest pain, edema or palpitations  There is a great deal of stress following repeat separation from his wife and poor contact with his children  Current Medications (verified): 1)  Aspirin Ec 325 Mg Tbec (Aspirin) .... Take One Tablet By Mouth Daily 2)  Benazepril Hcl 20 Mg Tabs (Benazepril Hcl) .Marland Kitchen.. 1 Once Daily 3)  Carvedilol 6.25 Mg Tabs (Carvedilol) .... Take One Tablet By Mouth Twice A Day 4)  Lipitor 80 Mg Tabs (Atorvastatin Calcium) .... Take One Tablet By Mouth Daily. 5)  Glucophage 1000 Mg Tabs (Metformin Hcl) .Marland Kitchen.. 1 Two Times A Day  Allergies (verified): 1)  ! Niacin  Past History:  Past Medical History: Last updated: 03/22/2009 Current Problems:  HYPERLIPIDEMIA-MIXED (ICD-272.4) SYSTOLIC HEART FAILURE, CHRONIC (ICD-428.22) ICD - IN SITU (ICD-V45.02) ST. JUDE  FOR SEVERE LVD EF 25% 2/07-explanted/2010  -Medtronic Virtuoso II DR dual-chamber       cardioverter-defibrillator with pocket revision, Dr. Sherryl Manges CAD, NATIVE VESSEL (ICD-414.01)S/P AWMI '05 STENT LAD, RCA, & CFX. LAST CATH'06 MOD RESTENOSIS LAD STENT, OTHERS PATENT TOBACCO ABUSE Ischemic cardiomyopathy Cerebrovascular disease. Diabetes mellitus  Past Surgical History: Last updated: 03/22/2009  Medtronic Virtuoso II DR dual-chamber       cardioverter-defibrillator with pocket revision, Dr. Sherryl Manges  Family History: Last updated: 01/22/2008 Father:alive with CAD Mother: Diabetes  Social History: Last updated: 01/22/2008 Full Time Divorced  Tobacco Use - Yes. 1PPD X 33 YRS Alcohol Use - no Drug Use - yes Cocain,Acid & Marijuana 25 yrs ago  Vital Signs:  Patient profile:   55 year old male Height:      72 inches Weight:      219 pounds BMI:     29.81 Pulse rate:   95 / minute Pulse rhythm:   regular BP sitting:   128 / 81  (right arm) Cuff size:   large  Vitals Entered By: Judithe Modest CMA (March 23, 2009 9:54 AM)  Physical Exam  General:  The patient was alert and oriented in no acute distress. HEENT Normal.  Neck veins were flat, carotids were brisk.  Lungs were clear.  Heart sounds were regular without murmurs or gallops.  Abdomen was soft with active bowel sounds. There is no clubbing cyanosis or edema. Skin Warm and dry  ICD Specifications Following MD:  Sherryl Manges, MD     Referring MD:  Jens Som ICD Vendor:  St Jude     ICD Model Number:  [phone]     ICD Serial Number:  [id number] ICD DOI:  02/08/2005     ICD Implanting MD:  Sherryl Manges, MD  Lead 1:    Location: RV     DOI: 02/08/2005     Model #: 7001     Serial #: [id number]     Status: active  Indications::  ISCHEMIC HEART DISEASE   ICD Follow Up Remote Check?  No Battery Voltage:  2.66 V     Charge Time:  11.1 seconds     Underlying rhythm:  SR@90  ICD Dependent:  No       ICD Device  Measurements Right Ventricle:  Amplitude: 12.0 mV, Impedance: 540 ohms, Threshold: 0.75 V at 0.5 msec  Episodes Coumadin:  No Shock:  0     ATP:  0     Nonsustained:  0     Ventricular Pacing:  0%  Brady Parameters Mode VVI     Lower Rate Limit:  40      Tachy Zones VF:  240     VT:  211     VT1:  182     Next Cardiology Appt Due:  06/02/2009 Tech Comments:  No parameter changes.  Device function normal.  ROV 3 months clinic. Altha Harm, LPN  March 23, 2009 10:03 AM   Impression & Recommendations:  Problem # 1:  DIZZINESS (ICD-780.4) This may be related to his blood pressure medications. I have reminded in the carvedilol to twice a day drug so he was put his 6.25 at 83.125 twice daily. In addition we'll cut his ACE inhibitor in half iand take just 10 mg a day  Problem # 2:  CAD (ICD-414.00) without chest pain medication adjustments as noted above His updated medication list for this problem includes:    Aspirin Ec 325 Mg Tbec (Aspirin) .Marland Kitchen... Take one tablet by mouth daily    Benazepril Hcl 10 Mg Tabs (Benazepril hcl) .Marland Kitchen... Take 1 tablet by mouth once a day    Carvedilol 3.125 Mg Tabs (Carvedilol) .Marland Kitchen... Take one tablet by mouth twice a day  Problem # 3:  SYSTOLIC HEART FAILURE, CHRONIC (ICD-428.22) stable His updated medication list for this problem includes:    Aspirin Ec 325 Mg Tbec (Aspirin) .Marland Kitchen... Take one tablet by mouth daily    Benazepril Hcl 10 Mg Tabs (Benazepril hcl) .Marland Kitchen... Take 1 tablet by mouth once a day    Carvedilol 3.125 Mg Tabs (Carvedilol) .Marland Kitchen... Take one tablet by mouth twice a day  Problem # 4:  ICD - ST J [place] (ICD-V45.02) Device parameters and data were reviewed and no changes were made  Patient Instructions: 1)  Your physician recommends that you schedule a follow-up appointment in: 3 months with device clinic.  2)  Your physician recommends that you continue on your current medications as directed. Please refer to the Current Medication list given to you  today.

## 2010-02-03 NOTE — Procedures (Signed)
Summary: device check/sl      Allergies Added:   Current Medications (verified): 1)  Aspirin Ec 325 Mg Tbec (Aspirin) .... Take One Tablet By Mouth Daily 2)  Benazepril Hcl 10 Mg Tabs (Benazepril Hcl) .... Take 1 Tablet By Mouth Once A Day 3)  Carvedilol 3.125 Mg Tabs (Carvedilol) .... Take One Tablet By Mouth Twice A Day 4)  Lipitor 80 Mg Tabs (Atorvastatin Calcium) .... Take One Tablet By Mouth Daily. 5)  Glucophage 1000 Mg Tabs (Metformin Hcl) .Marland Kitchen.. 1 Two Times A Day  Allergies (verified): 1)  ! Niacin   ICD Specifications Following MD:  Sherryl Manges, MD     Referring MD:  Jens Som ICD Vendor:  St Jude     ICD Model Number:  [id number]     ICD Serial Number:  [id number] ICD DOI:  02/08/2005     ICD Implanting MD:  Sherryl Manges, MD  Lead 1:    Location: RV     DOI: 02/08/2005     Model #: 7001     Serial #: [id number]     Status: active  Indications::  ISCHEMIC HEART DISEASE   ICD Follow Up Remote Check?  No Battery Voltage:  2.59 V     Charge Time:  12  seconds     Underlying rhythm:  SR ICD Dependent:  No       ICD Device Measurements Right Ventricle:  Amplitude: 12 mV, Impedance: 560 ohms, Threshold: 0.75 V at 0.5 msec  Episodes Coumadin:  No Shock:  0     ATP:  0     Nonsustained:  0     Ventricular Pacing:  <1%  Brady Parameters Mode VVI     Lower Rate Limit:  40      Tachy Zones VF:  240     VT:  211     VT1:  182     Next Cardiology Appt Due:  03/03/2010 Tech Comments:  No parameter changes.  Device function normal.  No Merllin @ this time.  ROV 3 months clinic. Altha Harm, LPN  December 15, 2009 9:20 AM

## 2010-02-03 NOTE — Cardiovascular Report (Signed)
Summary: Office Visit   Office Visit   Imported By: Roderic Ovens 12/20/2009 16:22:32  _____________________________________________________________________  External Attachment:    Type:   Image     Comment:   External Document

## 2010-03-16 ENCOUNTER — Encounter (INDEPENDENT_AMBULATORY_CARE_PROVIDER_SITE_OTHER): Payer: BC Managed Care – PPO

## 2010-03-16 ENCOUNTER — Encounter: Payer: Self-pay | Admitting: Internal Medicine

## 2010-03-16 DIAGNOSIS — I428 Other cardiomyopathies: Secondary | ICD-10-CM

## 2010-03-22 NOTE — Procedures (Signed)
Summary: device/saf  mca      Allergies Added:   Current Medications (verified): 1)  Aspirin Ec 325 Mg Tbec (Aspirin) .... Take One Tablet By Mouth Daily 2)  Benazepril Hcl 10 Mg Tabs (Benazepril Hcl) .... Take 1 Tablet By Mouth Once A Day 3)  Carvedilol 3.125 Mg Tabs (Carvedilol) .... Take One Tablet By Mouth Twice A Day 4)  Lipitor 80 Mg Tabs (Atorvastatin Calcium) .... Take One Tablet By Mouth Daily. 5)  Glucophage 1000 Mg Tabs (Metformin Hcl) .Marland Kitchen.. 1 Two Times A Day  Allergies (verified): 1)  ! Niacin   ICD Specifications Following MD:  Sherryl Manges, MD     Referring MD:  Jens Som ICD Vendor:  St Jude     ICD Model Number:  [phone]     ICD Serial Number:  [id number] ICD DOI:  02/08/2005     ICD Implanting MD:  Sherryl Manges, MD  Lead 1:    Location: RV     DOI: 02/08/2005     Model #: 7001     Serial #: [id number]     Status: active  Indications::  ISCHEMIC HEART DISEASE   ICD Follow Up ICD Dependent:  No      Episodes Coumadin:  No  Brady Parameters Mode VVI     Lower Rate Limit:  40      Tachy Zones VF:  240     VT:  211     VT1:  182     Tech Comments:  meds reviewed. see paceart report. [name]  March 16, 2010 9:30 AM

## 2010-05-17 NOTE — Assessment & Plan Note (Signed)
[facility]                            CARDIOLOGY OFFICE NOTE   NAME:Tamburrino, MARCELIS WISSNER                         MRN:          [id number]  DATE:07/19/2007                            DOB:          10/04/1955    Mr. Scalf is a 55 year old gentleman who has a history of coronary artery  disease and ischemic cardiomyopathy as well as a prior ICD.  Since I  last saw him, he states he feels really well.  He denies any dyspnea,  chest pain, palpitations, or syncope.  There is no pedal edema.  Note,  in the winter, he discontinued all of his medications and states that it  made him feel markedly better.  He saw Dr. Graciela Husbands back on May 07, 2007,  and some of his medications were resumed.  He also states that he has  difficulty affording his medicines.   At present, he is taking Lipitor 80 mg p.o. daily, aspirin 325 mg p.o.  daily, benazepril 20 mg p.o. daily, and Coreg 6.25 mg p.o. b.i.d.   PHYSICAL EXAMINATION:  VITAL SIGNS:  Today, blood pressure 137/89 and  the pulse is 84.  He weighs 244 pounds.  HEENT:  Normal.  NECK:  Supple.  CHEST:  Clear.  CARDIOVASCULAR:  Regular rate and rhythm.  ABDOMEN:  No tenderness.  EXTREMITIES:  Trace edema.   His electrocardiogram shows a sinus rhythm at a rate of 76.  The axis is  normal.  There is a prior anterior and inferior infarct.   DIAGNOSES:  1. Ischemic cardiomyopathy - Mr. Clippard has presumed some of his      medications.  I have stressed the importance of compliance with all      of his medications and he seems to understand this.  We will      increase his Coreg to 12.5 mg p.o. b.i.d. and ultimately increase      to 25 mg p.o. b.i.d. which he was on previously.  He will continue      on his angiotensin converting enzyme inhibitor.  2. History of coronary artery disease - he will continue on his      aspirin, statin, angiotensin converting enzyme inhibitor, and beta-      blocker.  3. History of implantable  cardioverter-defibrillator- this is being      managed by Dr. Graciela Husbands.  4. Hyperlipidemia - he will continue on his Lipitor and we will check      lipids and liver and adjust with a goal LDL of less than 70.  5. Tobacco abuse - we discussed the importance of discontinuing this.   We will see him back in 6 months.     Madolyn Frieze Jens Som, MD, [facility]  Electronically Signed    BSC/MedQ  DD: 07/19/2007  DT: 07/19/2007  Job #: [id number]   cc:   Caryn Bee L. Little, M.D.

## 2010-05-17 NOTE — Assessment & Plan Note (Signed)
[facility]                            CARDIOLOGY OFFICE NOTE   NAME:Wardell, XAI FRERKING                         MRN:          [id number]  DATE:10/31/2006                            DOB:          11-24-1955    Mr. Rallo is a pleasant gentleman, who has a history of coronary artery  disease and an ischemic cardiomyopathy, as well as ICD.  Since I last  saw him, he denies any dyspnea, chest pain, palpitations or syncope and  his ICD has not fired.  He recently ran out of all of his medications  due to financial issues and states he feels much better.  However, he  saw Dr. Clarene Duke and resuming his medications was recommended and he is  presently in the process of doing that.   His medications previously were Lipitor 80 mg p.o. daily, Niaspan 1 g  p.o. daily, Plavix 75 mg p.o. daily, aspirin 325 mg p.o. daily, Avandia  4 mg p.o. b.i.d., metformin 1000 mg p.o. b.i.d., insulin, Carvedilol 25  mg p.o. b.i.d. and benazepril 20 mg p.o. daily.   PHYSICAL EXAM:  Shows a blood pressure of 140/82, and his pulse is 89.  HEENT:  Normal.  NECK:  Supple.  CHEST:  Clear.  CARDIOVASCULAR EXAM:  Reveals a regular rate and rhythm.  ABDOMINAL EXAM:  Benign.  EXTREMITIES:  Show chronic skin changes and trace edema.   Electrocardiogram shows a sinus rhythm at a rate of 89.  There is a  prior anterior and inferior infarct.   DIAGNOSES:  1. Ischemic cardiomyopathy - I have asked him to resume all of his      medications, including his ACE inhibitor and beta blocker.  2. History of ICD - this is being interrogated today.  3. Coronary artery disease - he will continue on his aspirin, statin,      ACE inhibitor and beta blocker.  I will discontinue his Plavix, as      he has not been taking this anyway.  There are also financial      issues.  4. Hyperlipidemia - he will continue on his Lipitor and Niaspan and      Dr. Clarene Duke is following his lipids and liver.  Our goal LDL is  less      than 70, given his history of coronary disease.  5. Tobacco abuse - now resolved.   We will see him back in nine months.     Madolyn Frieze Jens Som, MD, [facility]  Electronically Signed   BSC/MedQ  DD: 10/31/2006  DT: 10/31/2006  Job #: [id number]   cc:   Caryn Bee L. Little, M.D.

## 2010-05-17 NOTE — Assessment & Plan Note (Signed)
[facility]                         ELECTROPHYSIOLOGY OFFICE NOTE   NAME:Love, [name]                         MRN:          [id number]  DATE:06/26/2006                            DOB:          [birth year]    SUBJECTIVE:  [name] comes in.  He is status post ICD implantation in  the setting of ischemic heart disease.  He has no complaints of chest  pain or shortness of breath.   REVIEW OF HIS MEDICATIONS:  Raise the question about whether his Coreg  had been discontinued, but he does think he is taking carvedilol 25 mg  b.i.d.; however, he has not taken any of his medicines for the last  month, because of financial issues.  He does work for [employer] and he is  aware of the drug program there.   PHYSICAL EXAMINATION:  VITAL SIGNS:  Today his blood pressure is mildly  elevated at 134/90, pulse 86.  LUNGS:  Clear.  HEART: Sounds were regular.   Interrogation of his St. Jude ICD demonstrates an R-wave of 12 with an  impedance of 560, a threshold of 0.75 at 0.4.  There are no inter-  current episodes.   IMPRESSION:  1. Ischemic heart disease      a.     Prior stenting with a myocardial infarction.      b.     An ejection fraction of about 25%.  2. Status post implantable cardioverter defibrillator for the above.  3. Diabetes.  4. Hypertension.  5. Medication compliance.   RECOMMENDATIONS:  [name] is stable from an arrhythmia point of view.  I have tried to encourage him to use the program at [facility] to get his  medications for $10.00 for three months.  Anything we can do to help  with his re-prescriptions to allow them to be generic would be a great  thing.   FOLLOWUP:  I will see him again in one year.     [name], MD, [facility]  Electronically Signed    SCK/MedQ  DD: 06/26/2006  DT: 06/26/2006  Job #: [id number]

## 2010-05-17 NOTE — Assessment & Plan Note (Signed)
[facility]                         ELECTROPHYSIOLOGY OFFICE NOTE   NAME:Hesse, MICHAE GRIMLEY                         MRN:          [id number]  DATE:05/07/2007                            DOB:          08-Dec-1955    Mr. Wolanski is seen in follow up for an ICD implanted for primary  prevention in the setting of ischemic heart disease.  He comes in today  having discontinued all of his medications (again?).  He says that he  feels 95% better on no medications at all.  His blood sugars run from  90s to 300s, but he notes no untowards.  He has some peripheral edema.   MEDICATIONS:  His medications are supposed be __________  carvedilol,  insulin, Lipitor, Niaspan and aspirin, only the latter of which he is  taking.   PHYSICAL EXAMINATION TODAY:  VITAL SIGNS:  His blood pressure is 135/88  with a pulse of 79.  LUNGS:  Clear.  HEART:  Heart sounds were regular without murmurs or gallops.  EXTREMITIES:  1+ edema.  He was in no acute distress.   Interrogation of his St. Jude ICD demonstrates an R-wave of 9.7 with  impedance of 580, threshold 0.75 at 0.5.  High voltage impedance was 36  with battery voltage of 3.1.   IMPRESSION:  1. Ischemic heart disease with (a) prior bypass, (b) depressed left      ventricular function.  2. Status post ICD for primary prevention.  3. Diabetes for medical noncompliance.   Mr. Laneve has stopped using his medicines as noted above.  I have  encouraged him to try and take them back one at this time, in addition  to his aspirin.  In that regard, we have started him back on benazepril  20 mg a day.  If he is feeling okay at the end of a month, he is  supposed to start back on carvedilol at 3.125 b.i.d.  He is to take that  for a month and then increase it to 6.25 b.i.d.  I have arranged for him  to see Dr. Jens Som in follow up at the end of July, which will be about  3 months from now.     [name], MD, [facility]  Electronically Signed    SCK/MedQ  DD: 05/07/2007  DT: 05/07/2007  Job #: [id number]   cc:   Caryn Bee L. Little, M.D.

## 2010-05-20 NOTE — Consult Note (Signed)
   NAME:  [name], [name]                            ACCOUNT NO.:  [id number]   MEDICAL RECORD NO.:  [id number]                   PATIENT TYPE:  EMS   LOCATION:  ED                                   FACILITY:  [facility]   PHYSICIAN:  Maretta Bees. Vonita Moss, M.D.             DATE OF BIRTH:  12/12/1955   DATE OF CONSULTATION:  09/29/2002  DATE OF DISCHARGE:                                   CONSULTATION   HISTORY:  I was asked to see this 55 year old white male with onset  yesterday of severe right flank pain.  Patient has no prior history of  stones.  There is a family history of stones.  He required a good deal of  analgesic to get this pain under control.   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  KUB was done.  I believe I can see a stone just adjacent to the transverse  process of L3 on the right side.   When I saw him, he was still a little sleepy, but his pain was well  relieved, and we discussed the fact that we probably could not necessarily  see the stone on lithotripsy, and I would advise to try and pass it  spontaneously or allow the stone to get down lower for ureteroscopy at a  later date, and he was agreeable with that.  He did not want to be  hospitalized.   PAST MEDICAL HISTORY:  1. He is a diabetic and takes oral agents.  2. He also has a history of hypercholesterolemia.  3. He has had an appendectomy in the past.   CURRENT MEDICATIONS:  Include Glucophage, glucovance and Avandia.   SOCIAL HISTORY:  He does not smoke or drink alcohol.   PHYSICAL EXAMINATION:  VITAL SIGNS:  His blood pressure is 170/100, and his  pulse was 80, and he was afebrile.  GENERAL:  He is slightly drowsy but otherwise alert and oriented.  ABDOMEN:  Is nontender.   IMPRESSION:  Right ureteropelvic junction stone.   PLAN:  We will send him home on forced fluids, activity as tolerated,  Mepergan Forte for pain.   FOLLOW UP:  We will see him in followup later this week, or he will call me  p.r.n. pain that is not manageable.                                               Maretta Bees. Vonita Moss, M.D.    LJP/MEDQ  D:  09/29/2002  T:  09/29/2002  Job:  [id number]

## 2010-05-20 NOTE — Op Note (Signed)
NAME:  [name], [name]                            ACCOUNT NO.:  [id number]   MEDICAL RECORD NO.:  [id number]                   PATIENT TYPE:  AMB   LOCATION:  [facility]                                 FACILITY:  [facility]   PHYSICIAN:  Maretta Bees. Vonita Moss, M.D.             DATE OF BIRTH:  14-May-1955   DATE OF PROCEDURE:  10/08/2002  DATE OF DISCHARGE:                                 OPERATIVE REPORT   PREOPERATIVE DIAGNOSIS:  Right ureteral calculus.   POSTOPERATIVE DIAGNOSIS:  Right ureteral calculus.   PROCEDURE:  1. Cystoscopy.  2. Right retrograde pyelogram with interpretation.  3. Right ureteroscopy.   SURGEON:  Maretta Bees. Vonita Moss, M.D.   ANESTHESIA:  General.   INDICATIONS:  This 55 year old white male was seen by me in the emergency  room at [facility] several days ago, with a severe right flank pain, nausea  and vomiting due to a 5 mm stone seen at the level of L3-4 on CT scan.  He  returned to the emergency room on October 01, 2002 with the same symptoms,  and the stone had moved to the top of L5.  Because of his severe discomfort  and persistent symptoms, a cystoscopy and right double-J catheter placement  was performed that day.  A glide wire was needed to get by an impacted  stone, and a 6-French 28 cm double-J catheter was left in place.  He  returned for follow-up in the office, and a KUB.  It looked like the stone  was over the sacroiliac region, and we discussed ureteroscopy versus ESL --  opted for a ureteroscopic retrieval of the stone.  He is brought to the OR  today for that reason.   DESCRIPTION OF PROCEDURE:  The patient is brought to the operating room and  placed in the lithotomy position.  External genitalia were prepped and  draped in the usual fashion.  The string attached to the double-J catheter  was pulled out to the tip of the meatus; it was going to go up the guide  wire, but it went out one of the side holes on the double-J catheter.  So, I  had to  cystoscope him and was fortunately able to locate the right ureteral  orifice, which was surrounded by mucosal edema.  The guide wire went up  without difficulty.  I could not feel I had bypassed the stone.  Then, with  the cystoscope removed and the guide wire in place, I inserted a 6-French  rigid ureteroscope and got up to the iliac artery region without difficulty.  But, then there was an angulation and I felt that I should not maneuver that  scope any further.  Up to that point, however, I had not seen the stone.  Therefore, I took out the ureteroscope and inserted a ureteral dilating  sheath, which went up the distal ureter very atraumatically.  I  then  inserted the ultra-thin ureteroscope and followed the guide wire all the way  up to the renal pelvis (where I saw some infundibuli and there was no stone  in the renal pelvis).  I could not see a stone, but could not get into any  of the lower pole calyces.  I then came down with the ureteroscope and could  not see a stone in the ureter at all.  I had very excellent visualization,  and followed all the way down the distal ureter right into the bladder.  I  removed the sheath with the scope at the very distal end of the ureter.   The guide wire was still in place, so I reinserted the rigid ureteroscope.  Again, almost got all the way up to the kidney.  Again, saw no evidence of  the stone whatsoever.  I then removed the ureteroscope and cystoscope  meticulously; made sure there was no stone within the bladder.   At this point I performed a retrograde pyelogram.  There was some mild  pyelocaliectasis.  I removed the guide wire, and after emptying the bladder  I removed the cystoscope.  I inserted Xylocaine jelly per urethra, and put  in a B&O suppository per rectum.   ASSESSMENT:  My conclusion is either that the stone was already passed  unbeknownst to the patient, or the stone was flushed back up into the renal  collecting system  where I could not see it with the ureteroscope or on  fluoroscopy.  Before injection of contrast I could not see any evidence of  this stone.   Hopefully, the stone has already passed.  He was then taken to the recovery  room in good condition.  He tolerated the procedure well.                                                Maretta Bees. Vonita Moss, M.D.    LJP/MEDQ  D:  10/08/2002  T:  10/08/2002  Job:  [id number]

## 2010-05-20 NOTE — Consult Note (Signed)
   NAME:  [name], [name]                            ACCOUNT NO.:  [id number]   MEDICAL RECORD NO.:  [id number]                   PATIENT TYPE:  EMS   LOCATION:  ED                                   FACILITY:  [facility]   PHYSICIAN:  Maretta Bees. Vonita Moss, M.D.             DATE OF BIRTH:  Apr 13, 1955   DATE OF CONSULTATION:  DATE OF DISCHARGE:                                   CONSULTATION   HISTORY OF PRESENT ILLNESS:  This 55 year old white male was seen by me over  the weekend with a right ureteral calculus about the level of L3-4 and he  had pain, nausea, and vomiting and he was sent home on analgesics to try and  pass the stone spontaneously but he has had intermittent pain, nausea, and  vomiting for the last two days and came to the emergency room this morning  and the stone has moved out about an inch to top of L5 but is still causing  him significant pain and discomfort.  I talked to him about various options  and we concluded that we would go ahead with a cystoscopy and double J  catheter placement to relieve his pain, discomfort, and nausea and vomiting.  Will probably put him in for 23 hours in view of his diabetic status.  I  told him I would consider trying to retrieve the stone today but I told him  in all likelihood I would probably just put up a double J catheter, see him  next week, see if the stone moved down lower with the double J catheter in  place.  He can consider an elective ureteroscopy after the double J catheter  has been in there for a week which would facilitate retrieval of an upper  ureteral calculus.  The patient and his wife understand and agree.                                               Maretta Bees. Vonita Moss, M.D.    LJP/MEDQ  D:  10/01/2002  T:  10/01/2002  Job:  [id number]

## 2010-05-20 NOTE — H&P (Signed)
NAME:  [name], DOSANJH                            ACCOUNT NO.:  [id number]   MEDICAL RECORD NO.:  [id number]                   PATIENT TYPE:  INP   LOCATION:  ED                                   FACILITY:  [facility]   PHYSICIAN:  [name], M.D. [facility]            DATE OF BIRTH:  1955-10-19   DATE OF ADMISSION:  05/08/2003  DATE OF DISCHARGE:                                HISTORY & PHYSICAL   [name] is a 55 year old male with a past medical history of diabetes  mellitus, hyperlipidemia who presents with an acute anterior myocardial  infarction. He has no prior cardiac history. He complained of general  malaise earlier today. At approximately 5 p.m. he developed nausea and  vomiting followed by substernal chest pain. The pain radiated to both upper  extremities, more so to the left. He described the pain as a burning.  There was no associated shortness of breath or diaphoresis. The pain was not  pleuritic or positional. He came to the [facility] emergency room and his  electrocardiogram revealed anterior ST elevation. We are therefore asked to  further evaluate. Of note he was having ongoing chest pain at the time of  the evaluation.   ALLERGIES:  No known drug allergies.   MEDICATIONS:  Lipitor, Niaspan, Glucophage, Glucotrol, Avandia, and  Lotensin, but he has taken none of these in the past two weeks as he states  he has not had these filled.   SOCIAL HISTORY:  He does smoke, but he does not consume alcohol.   FAMILY HISTORY:  Positive for coronary artery disease.   PAST MEDICAL HISTORY:  Significant for diabetes mellitus as well as  hyperlipidemia. He denies any hypertension. He has had a prior appendectomy.   REVIEW OF SYSTEMS:  There is no headaches, fevers, or chills. There is no  productive cough or hemoptysis. There is no dysphagia, odynophagia, melena,  or hematochezia. There is no dysuria or hematuria. There is no seizure  activity. There is no orthopnea, PND, or pedal  edema. There is no  claudication noted. The remainder of the systems are negative.   PHYSICAL EXAMINATION:  VITAL SIGNS: Blood pressure 130/80 at present. It was  177/103 on arrival. His heart rate on arrival was 103 and is now 82.  GENERAL: He is well-developed, well-nourished, and in mild distress.  SKIN: Warm and dry.  He is not diaphoretic.  He does not appear to be  __________  . There is no peripheral clubbing.  HEENT: Unremarkable with no __________  .  NECK:  Supple with no murmurs heard bilaterally and no bruits.  There is no  jugular venous distention and I cannot appreciate thyromegaly.  CHEST: Clear to auscultation with normal expansion anteriorly.  CARDIOVASCULAR: Regular rate and rhythm with normal S1 and S2.  There are no  murmurs, rubs, or gallops noted.  ABDOMEN: Nontender, nondistended. Positive bowel sounds. No  hepatosplenomegaly. No masses appreciated. No abdominal bruit. He has 2+  femoral pulses bilaterally and no bruits.  EXTREMITIES: Trace edema with chronic skin changes. There are 2+ dorsalis  pulses bilaterally.  NEUROLOGIC: Grossly intact.   His electrocardiogram shows a normal sinus rhythm with approximately 6 mm of  ST elevation anteriorly.  His white blood cell count is 9.9 with a  hemoglobin of 16.2, hematocrit 48.8. His platelet count is 176,000. The  remaining laboratories and chest x-ray are pending at the time of this  dictation.   DIAGNOSES:  1. Acute anterior myocardial infarction.  2. Diabetes mellitus.  3. Hyperlipidemia.  4. Noncompliance.   PLAN:  [name] presents with an acute anterior myocardial infarction. We  will treat with aspirin, heparin, nitroglycerin, and beta blockade. We will  add an ACE inhibitor tomorrow morning if his blood pressure allows. We will  also need to resume his statin. I discussed the risks and benefits of  emergent cardiac catheterization/PCI and the patient agrees to proceed.  Of  note he has not taken any  of his home medications in the past two weeks. We  need to stress compliance and he needs significant risk factor modification  including discontinuing his tobacco use.                                               [name], M.D. [facility]    BC/MEDQ  D:  05/07/2003  T:  05/07/2003  Job:  [id number]

## 2010-05-20 NOTE — H&P (Signed)
[facility]  Patient:    [name], [name]                           MRN: [id number] Adm. Date:  05/17/99 Attending:  Nadara Mustard, M.D. CC:         [name]. [name], M.D.                         History and Physical  DIABETIC FOOT CLINIC VISIT  [name] is 55 year old gentleman with type 2 diabetes who had been having episode of burning sharp pain in his right third web space plantar aspect of his foot.  States that it is a sharp shooting pain and occasionally feels like he has either broken his toe or stepping on a stone.  Height 6 feet 1 inch, weight 280 pounds.  Tobacco use one pack per day.  PRIMARY CARE PHYSICIAN:  Dr. Doran Clay.  LABORATORY DATA:  Most recent hemoglobin A1C 7.9 on May 06, 1999.  CBG 126 today.  PAST MEDICAL DIAGNOSIS:  Consistent for type 2 diabetes diagnosed in 1999, hypercholesterolemia.  SIGNIFICANT SURGERIES:  Appendectomy.  ALLERGIES:  No known drug allergies.  MEDICATIONS:  Glucophage, Lipitor, and Glucotrol.  EXAMINATION OF BOTH FEET:  He has thickening of the nails with onychomycosis. Decreased hair growth in both lower extremities with skin texture and pigment changes consistent with chronic venous insufficiency.  He does have sensation, can feel 5.07 ___ .  He has pain reproduced with palpation over the third webspace.  Examination of his shoes showed them to be extremely narrow for his foot.  ASSESSMENT: 1. Venous stasis insufficiency bilateral lower extremities. 2. Type 2 diabetes. 3. Mortons neuroma.  PLAN:  We will have him get some New Balance EEEE width shoes of the proper length and width and this was discussed with him. Plan to follow up in three months, if he is still symptomatic would proceed with an injection of the Mortons neuroma.  DD:  05/17/99 TD:  05/17/99 Job: [id number]

## 2010-05-20 NOTE — Cardiovascular Report (Signed)
NAMESIAN, JOLES                  ACCOUNT NO.:  [id number]   MEDICAL RECORD NO.:  [id number]          PATIENT TYPE:  OIB   LOCATION:  6501                         FACILITY:  [facility]   PHYSICIAN:  Carole Binning, M.D. LHCDATE OF BIRTH:  [birth year]   DATE OF PROCEDURE:  06/17/2004  DATE OF DISCHARGE:                              CARDIAC CATHETERIZATION   PROCEDURES PERFORMED:  1.  Left heart catheterization with coronary angiography and left      ventriculography.  2.  Intravascular ultrasound of the left anterior descending artery.   INDICATIONS:  The patient is 55 year old male who presented in May of 2005  with an acute anterior wall myocardial infarction with severe left  ventricular dysfunction. He underwent placement of a stent in the proximal  left anterior descending artery by Dr. Juanda Chance. He was enrolled in the  Horizons trial and received a Horizon study stent. He subsequently returned  for staged intervention with stent placement in the left circumflex and a  stent placement x2 in the right coronary artery. The patient returns today  for study mandated 6-month coronary angiography and intravascular  ultrasound. The patient has been doing well clinically without symptoms of  angina.   PROCEDURE NOTE:  A 6-French sheath was placed in the right femoral artery.  The right coronary artery was then imaged with a 5-French JR-4 catheter.  Left ventriculography was performed with an angled pigtail catheter. Left  coronary artery was imaged with a 6-French JL-4 guiding catheter. Contrast  was Omnipaque. After we completed the diagnostic catheterization, we  proceeded with intravascular ultrasound. Heparin was administered to achieve  an ACT of greater than 200 seconds. A Hi-Torque floppy wire was advanced  under fluoroscopic guidance into the distal LAD. Intravascular ultrasound of  the LAD was performed utilizing the Galaxy system. Images were recorded on  automatic pullback.  Intermittent doses of intracoronary nitroglycerin and  verapamil were administered to relieve spasm and improve coronary perfusion.  After completion of the intravascular ultrasound, angiographic images  revealed patency of the LAD with no evidence of vessel trauma. The guiding  catheter was then removed. At the conclusion of the procedure, a 6-French  Angio-Seal vascular closure device was placed in the right femoral artery  with good hemostasis. There were no complications.   RESULTS:  1.  Hemodynamics: Left ventricular pressure 112/22. Aortic pressure 112/68.      There is no aortic valve gradient.  2.  Left ventriculogram: There is global hypokinesis with left ventricle      with severe akinesis to dyskinesis of the apex. Ejection fraction is      estimated 25%. There is trace mitral regurgitation.  3.  Coronary arteriography:      1.  Left main is normal.      2.  Left anterior descending artery has moderate diffuse disease          throughout the vessel. There is a stent in the proximal vessel with          approximately 40-50% stenosis within the stent. Just beyond the  stent, there is a diffuse 40% stenosis. The distal LAD has a diffuse          20% stenosis. LAD gives rise to a small first diagonal, small second          diagonal, and normal-sized third diagonal branch.      3.  Left circumflex has a stent in the proximal vessel. There is a 20%          stenosis within the stent. Circumflex gives rise to a small ramus          intermedius, a large branching first obtuse marginal, and a small          second obtuse marginal. The first obtuse marginal has a 70% stenosis          proximally. This is unchanged from previous catheterization.      4.  Right coronary artery is a dominant vessel. There is a diffuse 20%          stenosis in the proximal vessel. There are overlapping stents in the          mid vessel which are widely patent with 0% stenosis within the           stent. The distal right coronary gives rise to normal-sized          posterior descending artery and three small posterior branches.          There are diffuse luminal irregularities throughout the distal right          coronary artery.   IMPRESSION:  1.  Severely decreased left ventricular systolic function secondary to prior      anterior wall myocardial infarction.  2.  Status post three-vessel stenting. There is moderate restenosis in the      left anterior descending artery stent with diffuse disease in the mid-      LAD distal to the stent. There is also moderate disease in the large      obtuse marginal which is unchanged from previous catheterization. The      stents in the left circumflex and right coronary artery are widely      patent.   PLAN:  The patient will be continued on medical therapy. It is recommended  that a consideration be given to a functional study to assess for ischemia  and viability in the anterior walls to determine whether the patient would  benefit from further revascularization of the left anterior descending  artery.       MWP/MEDQ  D:  06/17/2004  T:  06/17/2004  Job:  [id number]   cc:   [name], M.D. [facility]   Al Decant. Janey Greaser, MD  [address]  [place]  [place] [address]  Fax: [phone]

## 2010-05-20 NOTE — Op Note (Signed)
NAMEDECLAN, MIER NO.:  [id number]   MEDICAL RECORD NO.:  [id number]          PATIENT TYPE:  INP   LOCATION:  2899                         FACILITY:  [facility]   PHYSICIAN:  [name], M.D.  DATE OF BIRTH:  [birth year]   DATE OF PROCEDURE:  02/08/2005  DATE OF DISCHARGE:                                 OPERATIVE REPORT   Mr. Lindell has ischemic heart disease with an ejection fraction of 25% status  post anterior wall MI.  He is admitted for primary prevention ICD  implantation.   Following obtaining informed consent, the patient was brought to the  electrophysiology laboratory and placed on the fluoroscopy table in supine  position.  After routine prep and drape of the left upper chest, lidocaine  was infiltrated in the prepectoral subclavicular region.  An incision was  made and carried down to the layer of the prepectoral fascia using  electrocautery and sharp dissection.  A pocket was formed similarly.  Hemostasis was obtained.   Thereafter, attention was turned to gaining access to the extrathoracic left  subclavian vein which was accomplished without difficulty and without the  aspiration of air or puncture of the artery.  A single venipuncture was  accomplished.  A guide-wire was placed and retained.  Over this was passed a  7 [place] sheath through which was passed a St. Jude 7001, 65 cm dual coil  active fixation defibrillator, lead serial number [id number].  Under  fluoroscopic guidance, it was manipulated to the right ventricular apex  where the bipolar R wave was 5.7 millivolts with impedance of 1163 ohms and  a threshold of 0.8 volts at 0.5 milliseconds, current threshold was 0.7 MA.  There was no diaphragmatic pacing at 10 volts.   With these acceptable parameters recorded, the lead was secured to a St.  Jude Atlas V193ICD, serial number [id number].  Through the device, the bipolar R  wave was 6.3 millivolts with a pacing impedance of 650 ohms and  pacing  threshold of 0.75 volts at 0.5 milliseconds and the high voltage one joule  shock impedance was 35 ohms.  At this point, defibrillator threshold testing  was undertaken.  Ventricular fibrillation was induced via the T wave shock.  After a total duration of 6 seconds, a 15 joule shock was delivered, this  failed to terminate ventricular fibrillation.  After a total duration of 14  seconds, a 25 joule shock was delivered to a measured resistance of 48 ohms  terminating ventricular fibrillation and restoring sinus rhythm.  After a  wait of 5-6 minutes, ventricular fibrillation was reinduced via the T wave  shock.  After a total duration of 8 seconds, a 25 joule shock was delivered  restoring sinus rhythm.   At this point, the pocket was copiously irrigated with antibiotic containing  saline solution.  Hemostasis was assured in the leads and the lead and pulse  generator were placed in the pocket and secured to the prepectoral fascia.  The wound was closed in three layers.  However, during closure, we noted  persistent problems with  ventricular ectopy.  An AP fluorogram demonstrated  that there was more redundancy in the lead that had been appreciated in the  RAO image.  We then opened up a pocket again, loosened up the lead,  retracted the lead about 1 cm to get the secondary bend out, ventricular  ectopy went away.  The lead was re-secured to the prepectoral fascia.  Reinterrogation of the device demonstrated an R wave of 5.9 with an  impedance of 790, threshold 0.5 volts at 0.5 milliseconds and a high voltage  impedance was 36 ohms.  At this point, the pocket was again copiously  irrigated with antibiotic containing saline solution, hemostasis was again  secured, the lead and the pulse generator were placed back in the pocket and  secured to the prepectoral fascia.  The wound was then closed in three  layers in a normal fashion.  The wound was  washed, dried, and a Benzoin and  Steri-Strip dressing was applied.  Needle  counts, sponge counts, and instrument counts were correct at the end of the  procedure according to the staff.  The patient tolerated the procedure  without apparent complications.           ______________________________  [name], M.D.     SCK/MEDQ  D:  02/08/2005  T:  02/08/2005  Job:  [id number]

## 2010-05-20 NOTE — Discharge Summary (Signed)
NAME:  Dalton, [name]                            ACCOUNT NO.:  [id number]   MEDICAL RECORD NO.:  [id number]                   PATIENT TYPE:  INP   LOCATION:  6529                                 FACILITY:  [facility]   PHYSICIAN:  [name], M.D. [facility]            DATE OF BIRTH:  Mar 28, 1955   DATE OF ADMISSION:  05/08/2003  DATE OF DISCHARGE:  05/13/2003                           DISCHARGE SUMMARY - REFERRING   PROCEDURES:  Emergent coronary angiography/stenting left anterior  descending, May 08, 2003.   REASON FOR ADMISSION:  The patient is a 55 year old male with no prior  cardiac history but with multiple cardiac risk factors, who presented to the  emergency room with acute anterior myocardial infarction.  Please refer to  the dictated admission note for further details.   LABORATORY DATA:  Laboratory data shows hemoglobin 12, hematocrit 35,  platelet count 111,000 at discharge.  Potassium 3.8, BUN 11, creatinine 0.9  at discharge.  Renal function remained normal throughout.  Glucose 278 on  admission.  Hemoglobin A1C 11.4.  Cardiac enzymes: Peak CPK 2160/107, 4.9%;  troponin-I 0.70 on admission.  BNP 67.  Lipid profile showing total  cholesterol 170, triglycerides 144, HDL 31, LDL 110 (cholesterol/HDL ratio  5.5).  TSH 1.17.  C-Reactive protein 0.3.   Admission chest x-ray showing ? mild edema.   HOSPITAL COURSE:  The patient was taken directly to the catheterization  laboratory where he underwent emergent coronary angiography by Dr. Charlies Constable, see report for full details.  This revealed severe multivessel  coronary artery disease with severe left ventricular dysfunction, ejection  fraction approximately 25%.  Dr. Juanda Chance proceeded with successful stenting  of 100% occluded proximal left anterior descending to 0% residual stenosis  and restoration of TIMI 2 flow.  There was diffuse distal left anterior  descending disease noted.   Distal aortogram revealed no significant  atherosclerosis.  The patient was  maintained on a balloon pump which was successfully weaned off within 24  hours.  The plan was to consider returning him to the laboratory for  multivessel percutaneous coronary intervention versus bypass surgery.   The patient remained hemodynamically stable, and his blood pressure  improved.  He was started on low dose Altace and Coreg.  The patient was  also maintained on heparin over the weekend.   Dr. Jens Som conferred with Dr. Juanda Chance regarding further therapeutic options  and plan was to return for repeat percutaneous intervention.   On hospital day #5 the patient underwent successful stenting (non-DES) of an  80% circumflex lesion as well as stenting (TAXUS) of two right coronary  artery lesions, both without noted complications.  The left anterior  descending stent site remained widely patent.   The patient was kept for overnight observation and cleared for discharge the  following morning in hemodynamically stable condition.  There were no noted  complications of the groin incision sites.   Of  note, the patient was maintained on sliding scale insulin for treatment  of his diabetes.  He had been on 3 oral medications prior to admission but  apparently had not taken any of his medications in the two weeks prior to  admission.  His hemoglobin A1C was elevated (11.4) on admission.  He was  placed on Lantus and plan was to continue this medication until further  evaluation and recommendations by his primary care physician, Dr. Janey Greaser,  following discharge.   The patient will also need to be maintained on Plavix for at least six  months, if not indefinitely.   DISCHARGE MEDICATIONS:  1. Plavix 75 mg daily, (at least six months).  2. Coated aspirin 325 mg daily.  3. Lantus insulin, 30 units q.h.s.  4. Coreg 6.25 mg b.i.d.  5. Altace 2.5 mg b.i.d.  6. Lipitor 80 mg daily.  7. Nitroglycerin 0.4 mg PRN.   DISCHARGE INSTRUCTIONS:  1. No  return to work until patient is seen by physician.  2. No driving X1 week.  3. Stop smoking.  4. Low fat/cholesterol/diabetic diet.  5. Call the office if there is any swelling/bleeding of the groin.   FOLLOW UP:  Patient will follow up with Dr. Olga [name], P.A. [facility] on  May 27, 2003 at 1:45 P.M.  Patient is instructed to arrange follow up with  Dr. Doran Clay regarding management of his diabetes.   DISCHARGE DIAGNOSES:  1. Status post acute anterior myocardial infarction.     A. Emergent stenting left anterior descending May 07, 2003; elective        multivessel stenting of the circumflex and right coronary arteries May 12, 2003.     B. Severe left ventricular dysfunction, (ejection fraction 25%).  2. Diabetes mellitus.  3. Dyslipidemia.  4. Tobacco.  5. Noncompliance.      [name], P.A. [facility]                      [name], M.D. [facility]    GS/MEDQ  D:  05/13/2003  T:  05/13/2003  Job:  [id number]   cc:   Al Decant. Janey Greaser, MD  [address]  [place]  [place] [address]  Fax: [phone]

## 2010-05-20 NOTE — Cardiovascular Report (Signed)
NAME:  [name], [name]                            ACCOUNT NO.:  [id number]   MEDICAL RECORD NO.:  [id number]                   PATIENT TYPE:  INP   LOCATION:  2303                                 FACILITY:  [facility]   PHYSICIAN:  Charlies Constable, M.D. [facility]              DATE OF BIRTH:  09-Dec-1955   DATE OF PROCEDURE:  05/08/2003  DATE OF DISCHARGE:                              CARDIAC CATHETERIZATION   CLINICAL HISTORY:  Mr. Diebold is 55 years old and has diabetes and  hyperlipidemia.  He ran out of his medications recently.  He had chest pain  on May 4 and then he had recurrent chest pain at 5 p.m. on May 5 and came to  [facility] Room at approximately 2203 with EKG change of an acute  anterior wall infarction.  He was seen by Dr. Jens Som and transferred to  [facility] for intervention.   PROCEDURE:  The procedure was performed via the right femoral artery using  arterial sheath and 6 French preformed coronary catheters. A front wall  arterial puncture was performed and Omnipaque contrast was used.  After  completion of the diagnostic study, made a decision to proceed with  intervention on the left anterior descending artery.  The patient was  enrolled in the Horizon's trial and was randomized to heparin and  integrilin.  We gave heparin to perform ACT greater than 200 seconds and he  was given 300 mg of Plavix.   After completion of diagnostic study, made a decision to place an  intraaortic balloon pump because of severe left ventricular dysfunction and  multivessel disease.  We placed a Datascope balloon via the left femoral  artery.  We then proceeded with intervention on the left anterior descending  artery.  We used a 6 [place] Q4 guiding catheter with side holes and a Luge  wire.  We crossed the lesion in the proximal LAD with the wire without too  much difficulty.  We dilated the lesion to reestablish flow.  We then  dilated with a 2.25 x 20-mm Maverick performing one  inflation up to 8  atmospheres for 30 seconds.  This reestablished TIMI-2-3 flow.  We then  deployed a 2.5 x 20-mm Horizon study stent in the proximal LAD with one  inflation up to 15 atmospheres for 30 seconds.  Repeat diagnostics were then  performed through the guiding catheter.  We had TIMI-2 flow in the vessel so  we gave 600 mg of intracoronary Verapamil.   We then performed IVUS on the lesion as part of the Horizon's protocol with  automatic pullback.  The LAD distal to the stent was somewhat diffusely  diseased and there was a 70% lesion in the mid portion of the vessel.  The  stent appeared to be well expanded with diameter of about 2.4 and good  apposition.  Repeat diagnostic study was then performed through the  guiding  catheter.   We then placed a Swan-Ganz catheter via the right femoral vein to the  pulmonary artery.   Despite the severity of his infarct and disease, the patient remained stable  during the procedure.  His blood pressure did drop into the 70s and required  dopamine after reperfusion, but responded promptly to dopamine and were able  to wean the dopamine off and his blood pressure was well over 100 when he  left the laboratory.   RESULTS:  Left main coronary artery:  The left main coronary was free of  significant disease.   Left anterior descending artery:  The left anterior descending artery gave  rise to a septal perforator and a diagonal branch and then was completely  occluded.  The diagonal branch had an 80% ostial stenosis, but this was a  very small vessel.   The circumflex artery:  The circumflex artery gave rise to a marginal branch  and a posterior lateral branch.  There was 80% proximal stenosis in the  circumflex artery and there was 70% stenosis in the proximal portion of the  marginal branch.   Right coronary artery:  The right coronary artery is a moderate size vessel  that gave rise to a right ventricular branch, posterior descending  branch  and two posterior lateral branches.  There was a long area of segmental  narrowing in the mid to distal vessel which was 90%.   LEFT VENTRICULOGRAM:  The left ventriculogram performed in the RAO  projection showed hypokinesis of the anterolateral wall, hypokinesis of the  inferior wall and akinesis of the apex.  The estimated ejection fraction was  25%.   DISTAL AORTOGRAM:  Distal aortogram was performed which showed patent renal  arteries and no major aortoiliac obstruction although the initial aorta and  iliacs were irregular.   HEMODYNAMIC DATA:  The right atrial pressure was 28 mean.  The pulmonary  artery pressure was 32/24 with a mean of 28.  The pulmonary wedge pressure  was 22 mean.  The left ventricular pressure was 101/26.  The aortic pressure  was 101/73 with a mean of 86.  Cardiac output/cardiac index was 5.4/2.3  liters/minute/sq m by Fick.   Following stenting of the lesion in the proximal LAD, the stenosis improved  from 100% to 0% and the flow improved from TIMI-0 to TIMI-2 flow.  Distal  LAD gave rise to a diagonal branch.  After which there was a 70% narrowing.  There was diffuse disease distally in the LAD.   The patient had the onset of chest pain at 1700 and arrived at [facility]  Emergency Room at 2203.  First balloon inflation established reperfusion at  0013 on May 6 whereas the pain began on May 5.  This gave a door-to-balloon  time of 2 hrs and 10 minutes and reperfusion time of 7 hours and 13 minutes.   CONCLUSION:  1. Acute anterior wall myocardial infarction with total occlusion of the     proximal left anterior descending artery, 80% stenosis in the proximal     circumflex artery with 70% narrowing in the marginal branch, 90%     narrowing in the distal right coronary artery and severe left ventricular     dysfunction with inferior and anterolateral hypokinesis and apical wall     akinesis with an estimated ejection fraction of 25%. 2.  Placement of intraaortic balloon pump prophylactically.  3. Successful stenting of the proximal left anterior descending using a  Horizon study stent with improvement in stent renarrowing from 100% to 0%     and improvement in flow from TIMI-0 to TIMI-2 flow.   DISPOSITION:  The patient was returned to the postangioplasty unit for  further observation.  His outlook is guarded because of his severe left  ventricular dysfunction and multivessel disease.  I will have to review with  my  colleagues whether we think we can manage his residual disease  percutaneously or whether he might require bypass surgery.  When we made the  decision to randomize him to Horizon study stent, we thought that he might  be able to be managed percutaneously but will need to review this.                                               Charlies Constable, M.D. [facility]    BB/MEDQ  D:  05/08/2003  T:  05/09/2003  Job:  [id number]   cc:   Al Decant. Janey Greaser, MD  [address]  [place]  [place] [address]  Fax: [phone]

## 2010-05-20 NOTE — Assessment & Plan Note (Signed)
[facility]                            CARDIOLOGY OFFICE NOTE   NAME:[name], [name]                         MRN:          [id number]  DATE:12/28/2005                            DOB:          Jun 06, 1955    [name] returns for followup today.  Please refer to my previous notes  for details.  Since I last saw him there is no exertional chest pain,  dyspnea, pedal edema, or syncope.  His defibrillator has not fired.   MEDICATIONS:  1. Lotensin 20 mg p.o. b.i.d.  2. Lipitor 80 mg p.o. daily.  3. Niaspan 1 g p.o. daily,  4. Plavix 75 mg p.o. daily.  5. Aspirin 325 mg p.o. daily.  6. Avandia 4 mg p.o. b.i.d.  7. Metformin 1000 mg p.o. b.i.d.  8. Insulin and Coreg 25 mg p.o. b.i.d.   PHYSICAL EXAM:  Blood pressure 122/74, and his pulse is 77.  NECK:  Supple.  No bruits.  CHEST:  Clear to auscultation.  CARDIOVASCULAR:  Regular rate and rhythm.  ABDOMEN:  No pulsatile masses.  No bruits.  EXTREMITIES:  No edema.   ELECTROCARDIOGRAM:  Sinus rhythm with occasional PVCs.  The axis is  normal.  There is a prior anterior and inferior infarct.   DIAGNOSES:  1. Ischemic cardiomyopathy.  2. Status post implantable cardioverter defibrillator.  3. Coronary artery disease.  4. Hypertension.  5. Hyperlipidemia.  6. Tobacco abuse.   PLAN:  [name] is doing well from a symptomatic standpoint.  We will  continue on his present medications.  He is scheduled to have blood  drawn by Dr. Janey Greaser in the next month, and I will have that forwarded  to [place] for our records.  Our goal LDL should be less than 70 given his  history of coronary artery disease.  He has been off of cigarettes for 2  weeks and I have  encouraged him to continue off.  He also understands exercise and diet.  I will see him back in 9 months.     Madolyn Frieze Jens Som, MD, [facility]  Electronically Signed    BSC/MedQ  DD: 12/28/2005  DT: 12/28/2005  Job #: [id number]   cc:   Al Decant. Janey Greaser, MD

## 2010-05-20 NOTE — Cardiovascular Report (Signed)
NAME:  [name], [name]                            ACCOUNT NO.:  [id number]   MEDICAL RECORD NO.:  [id number]                   PATIENT TYPE:  INP   LOCATION:  6529                                 FACILITY:  [facility]   PHYSICIAN:  Charlies Constable, M.D. [facility]              DATE OF BIRTH:  02-14-1955   DATE OF PROCEDURE:  05/12/2003  DATE OF DISCHARGE:                              CARDIAC CATHETERIZATION   CLINICAL HISTORY:  Mr. Knoblock is 55 years old and has diabetes and was  admitted to the hospital on May 5 with acute anterior wall infarction.  He  had severe left ventricular dysfunction and three-vessel coronary disease  and we put a balloon pump and stented the LAD.  We only achieved TIMI-2 flow  and the distal LAD was somewhat diffusely diseased.  We brought him back  today for intervention of the circumflex and right coronary arteries.   PROCEDURE PERFORMED:  Left heart catheterization, selective coronary  angiography, stenting of the circumflex artery with bare metal stent,  stenting of the right coronary artery x2 with a drug-eluting stent.   PROCEDURE:  The procedure was performed via the right femoral artery using  arterial sheath and 6 French preformed coronary catheters. After performing  a left ventriculogram and assessing the left anterior descending artery, we  made preparations for proceeding with intervention of the circumflex and  then the right coronary artery.  The patient was given weight-adjusted  heparin and performed an ACT greater than 200 seconds and was given double-  bolus integrilin and infusion.  He had been on Plavix.  We used a CLS-4 6  inch guiding catheter with side holes and a Luge wire.  We crossed the  lesion in the proximal circumflex artery with the wire without difficulty.  We predilated with 3.5 x 15-mm Quantum Maverick performing one inflation up  to 10 atmospheres for 30 seconds.  We then deployed a 3.5 x 12-mm Vision  Stent deploying this with one  inflation of 14 atmospheres for 30 seconds.  We chose a Vision stent because there was a sharp bend in the circumflex  artery making access difficult and because the vessel was large caliber and  we thought the marginal difference between the bare metal stent and drug-  eluting stent was small.   We then approached the right coronary artery.  We used a 6 [place] JR-4  guiding catheter and a Luge wire.  There was a long lesion extending from  the mid to distal right coronary artery and we crossed this with the wire  without too much difficulty.  We predilated with a 2.25 x 30-mm Maverick  performing two inflations up to 10 atmospheres for 30 seconds.  We then  deployed a 2.5 x 20-mm Taxus positioning the distal edge of the stent just  before the posterior descending branch.  We deployed this with one inflation  of  10 atmospheres for 30 seconds and a second inflation of 14 atmospheres  for 30 seconds with the balloon inside the distal edge.  We then positioned  the second 2.5 x 16-mm Taxus stent overlapping the first stent.  We deployed  this with one inflation up to 12 atmospheres for 30 seconds.  We then  dilated both stents with a 2.75 x 20-mm Quantum Maverick performing two  inflations up to 15 atmospheres for 30 seconds avoiding both the distal and  proximal edges.  Repeat diagnostic studies were then performed through the  guiding catheter.  The patient tolerated the procedure well and left the  laboratory in satisfactory condition.   RESULTS:  Left anterior descending artery:  The left anterior descending  artery stent was widely patent in the proximal vessel with 0% stenosis.  There was TIMI-3 flow distally, but the LAD was diffusely diseased distally.   LEFT VENTRICULOGRAM:  The left ventriculogram performed in the RAO  projection showed akinesis of the anterolateral and apex and hypokinesis of  the inferior wall.  The estimated ejection fraction was 25%.   Following stenting of  the lesion in the proximal circumflex artery the  stenosis improved from 80% to 0%.  There was residual 70% narrowing in the  proximal portion of the marginal branch.   Following stenting of the lesion in the mid and distal right coronary artery  the stenosis improved from 90% to 0%.   CONCLUSIONS:  1. Successful stenting of the lesion in the proximal circumflex artery with     a bare metal stent with improvement in stent renarrowing from 80% to 0%.  2. Successful stenting of the lesion in the mid to distal right coronary     artery with two overlapping Taxus stents with improvement in stent     renarrowing from 90% to 0%.   DISPOSITION:  The patient was returned to the postangioplasty unit for  further observation.                                               Charlies Constable, M.D. [facility]    BB/MEDQ  D:  05/12/2003  T:  05/12/2003  Job:  [id number]   cc:   Al Decant. Janey Greaser, MD  [address]  [place]  [place] [address]  Fax: [phone]

## 2010-05-20 NOTE — Op Note (Signed)
   NAME:  [name], [name]                            ACCOUNT NO.:  [id number]   MEDICAL RECORD NO.:  [id number]                   PATIENT TYPE:  AMB   LOCATION:  [facility]                                 FACILITY:  [facility]   PHYSICIAN:  Maretta Bees. Vonita Moss, M.D.             DATE OF BIRTH:  Oct 23, 1955   DATE OF PROCEDURE:  10/01/2002  DATE OF DISCHARGE:                                 OPERATIVE REPORT   PREOPERATIVE DIAGNOSIS:  Right upper ureteral calculus.   POSTOPERATIVE DIAGNOSIS:  Right upper ureteral calculus.   PROCEDURE:  1. Cystoscopy.  2. Right retrograde pyelogram with interpretation.  3. Insertion of right double-J catheter.   SURGEON:  Maretta Bees. Vonita Moss, M.D.   ANESTHESIA:  General.   INDICATIONS:  This 55 year old white male, diabetic, was seen by me in the  emergency room on September 29, 2002, with severe pain, nausea and vomiting  in the right side due a 5 mm stone at the right UPJ adjacent to the  transverse process of L3.  He returned to the ER today with continued pain,  nausea, and vomiting, and a KUB shows the stone is down at the top of L4.  Because of his severe symptomatology, he is brought to the OR today for  further therapy.   DESCRIPTION OF PROCEDURE:  The patient was brought to operating room and  placed in lithotomy position.  External genitalia were prepped and draped in  the usual fashion.  He was cystoscoped.  The anterior urethra and prostate  were unremarkable.  The bladder was normal.  A metal guidewire was placed up  the ureter, but it would not go by the stone, so I used a Glidewire to  bypass this somewhat impacted right ureteral calculus and then inserted an  open-ended ureteral catheter and performed a right retrograde pyelogram  showing moderate pyelocaliectasis.  With the metal guidewire now reinserted  in the renal pelvis, I inserted a 6 French 28 cm double-J catheter.  It  coiled in the renal pelvis with a full coil in the bladder.  String  was  brought out per urethra after emptying the bladder, removing the cystoscope.  The string was taped to the penis, and he was taken to the recovery room in  good condition.                                               Maretta Bees. Vonita Moss, M.D.    LJP/MEDQ  D:  10/01/2002  T:  10/01/2002  Job:  [id number]   cc:   Al Decant. Janey Greaser, MD  [address]  [place]  [place] [address]  Fax: [phone]

## 2010-06-16 ENCOUNTER — Ambulatory Visit (INDEPENDENT_AMBULATORY_CARE_PROVIDER_SITE_OTHER): Payer: BC Managed Care – PPO | Admitting: *Deleted

## 2010-06-16 DIAGNOSIS — I428 Other cardiomyopathies: Secondary | ICD-10-CM

## 2010-06-16 DIAGNOSIS — I5022 Chronic systolic (congestive) heart failure: Secondary | ICD-10-CM

## 2010-06-16 NOTE — Progress Notes (Signed)
icd check by industry

## 2010-06-28 ENCOUNTER — Encounter: Payer: Self-pay | Admitting: Cardiology

## 2010-08-02 ENCOUNTER — Encounter: Payer: Self-pay | Admitting: Cardiology

## 2010-08-09 ENCOUNTER — Encounter: Payer: Self-pay | Admitting: Cardiology

## 2010-08-09 ENCOUNTER — Ambulatory Visit (INDEPENDENT_AMBULATORY_CARE_PROVIDER_SITE_OTHER): Payer: BC Managed Care – PPO | Admitting: Cardiology

## 2010-08-09 DIAGNOSIS — Z79899 Other long term (current) drug therapy: Secondary | ICD-10-CM

## 2010-08-09 DIAGNOSIS — I739 Peripheral vascular disease, unspecified: Secondary | ICD-10-CM

## 2010-08-09 DIAGNOSIS — I679 Cerebrovascular disease, unspecified: Secondary | ICD-10-CM

## 2010-08-09 DIAGNOSIS — F17200 Nicotine dependence, unspecified, uncomplicated: Secondary | ICD-10-CM

## 2010-08-09 DIAGNOSIS — I2510 Atherosclerotic heart disease of native coronary artery without angina pectoris: Secondary | ICD-10-CM

## 2010-08-09 DIAGNOSIS — E7800 Pure hypercholesterolemia, unspecified: Secondary | ICD-10-CM

## 2010-08-09 DIAGNOSIS — E785 Hyperlipidemia, unspecified: Secondary | ICD-10-CM

## 2010-08-09 DIAGNOSIS — I6529 Occlusion and stenosis of unspecified carotid artery: Secondary | ICD-10-CM

## 2010-08-09 DIAGNOSIS — I10 Essential (primary) hypertension: Secondary | ICD-10-CM

## 2010-08-09 DIAGNOSIS — I2589 Other forms of chronic ischemic heart disease: Secondary | ICD-10-CM

## 2010-08-09 DIAGNOSIS — Z95810 Presence of automatic (implantable) cardiac defibrillator: Secondary | ICD-10-CM | POA: Insufficient documentation

## 2010-08-09 LAB — BASIC METABOLIC PANEL
BUN: 19 mg/dL (ref 6–23)
CO2: 28 mEq/L (ref 19–32)
Calcium: 9.2 mg/dL (ref 8.4–10.5)
Creatinine, Ser: 1 mg/dL (ref 0.4–1.5)
GFR: 86.18 mL/min (ref >60.00)
Potassium: 4.2 mEq/L (ref 3.5–5.1)
Sodium: 137 mEq/L (ref 135–145)

## 2010-08-09 LAB — HEPATIC FUNCTION PANEL
AST: 13 U/L (ref 0–37)
Total Bilirubin: 0.5 mg/dL (ref 0.3–1.2)
Total Protein: 7.8 g/dL (ref 6.0–8.3)

## 2010-08-09 LAB — LIPID PANEL: Cholesterol: 168 mg/dL (ref 0–200)

## 2010-08-09 NOTE — Progress Notes (Signed)
HPI: [name] is a  gentleman who has a history of coronary artery disease and ischemic cardiomyopathy as well as a prior ICD. The patient has had previous three-vessel stenting. His last Myoview was performed in June 2010. At that time there was a prior extensive scar in the basal inferior wall, apex and mid/distal anteroseptal wall. There was no ischemia. His ejection fraction was 31%. Last carotid Dopplers were performed in August of 2011. At that time he had 60-79% bilateral stenosis with the right at the high end of range. F/u recommended in 6 months. Last ABIs were performed in February 2011. The patient had a greater than 50% right SFA stenosis and less than 50% left SFA stenosis. ABI on the right was mild and on the left was normal. Abdominal ultrasound in February 2011 showed no aneurysm.  I last saw him in August of 2011. Since then the patient has dyspnea with more extreme activities but not with routine activities. It is relieved with rest. It is not associated with chest pain. There is no orthopnea, PND or pedal edema. There is no syncope or palpitations. There is no exertional chest pain.  Current Outpatient Prescriptions  Medication Sig Dispense Refill  . aspirin 325 MG EC tablet Take 325 mg by mouth daily.        Marland Kitchen atorvastatin (LIPITOR) 80 MG tablet Take 80 mg by mouth daily.        . benazepril (LOTENSIN) 10 MG tablet Take 10 mg by mouth daily.        . carvedilol (COREG) 3.125 MG tablet Take 3.125 mg by mouth 2 (two) times daily with a meal.        . metFORMIN (GLUCOPHAGE) 1000 MG tablet Take 1,000 mg by mouth 2 (two) times daily.           Past Medical History  Diagnosis Date  . Hyperlipidemia     mixed  . Chronic systolic heart failure   . ICD (implantable cardiac defibrillator), dual, in situ     St. Jude for severe LVD EF 25% 2/07 explanted 2010. Medtronic Virtuoso II DR Dual-chamber cardiverter-defibrillator  with pocket revision, Dr. Graciela Husbands  . Coronary artery disease    native vessel. S/P AWMI '05 Stent LAD, RCA, CFX. Last Cath 2006 Mod restenosis LAD stent, others patent  . Tobacco abuse   . Ischemic cardiomyopathy   . Cerebrovascular disease   . Diabetes mellitus     Past Surgical History  Procedure Date  . Medtronic virtuoso ii dr dual-chamber cardioverter-defibrillation with pocket revison     Dr. Sherryl Manges    History   Social History  . Marital Status: Single    Spouse Name: N/A    Number of Children: N/A  . Years of Education: N/A   Occupational History  . [employer]    Full time   Social History Main Topics  . Smoking status: Current Everyday Smoker -- 1.0 packs/day for 33 years    Types: Cigarettes  . Smokeless tobacco: Not on file  . Alcohol Use: No  . Drug Use: No     former Cocain, Acid, Marijuana - 25 years ago  . Sexually Active:    Other Topics Concern  . Not on file   Social History Narrative   Divorced.    ROS: Complains of back pain but no fevers or chills, productive cough, hemoptysis, dysphasia, odynophagia, melena, hematochezia, dysuria, hematuria, rash, seizure activity, orthopnea, PND, pedal edema, claudication. Remaining systems are negative.  Physical Exam: Well-developed well-nourished in no acute distress.  Skin is warm and dry.  HEENT is normal.  Neck is supple. No thyromegaly.  Chest is clear to auscultation with normal expansion.  Cardiovascular exam is regular rate and rhythm.  Abdominal exam nontender or distended. No masses palpated. Extremities show no edema. Chronic skin changes neuro grossly intact  ECG normal sinus rhythm at a rate of 88. Axis normal. Prior anterior and inferior infarct.

## 2010-08-09 NOTE — Assessment & Plan Note (Signed)
Blood pressure mildly elevated but he states typically 105/70. Continue present medications. Check potassium and renal function.

## 2010-08-09 NOTE — Assessment & Plan Note (Signed)
Continued ACE inhibitor and beta blocker. I would not his medications as he has had some difficulties with dizziness when advancing his medications previously.

## 2010-08-09 NOTE — Assessment & Plan Note (Signed)
Continue statin. Check lipids and liver. 

## 2010-08-09 NOTE — Assessment & Plan Note (Addendum)
Continue aspirin and statin. 

## 2010-08-09 NOTE — Patient Instructions (Signed)
Your physician wants you to follow-up in: one year You will receive a reminder letter in the mail two months in advance. If you don't receive a letter, please call our office to schedule the follow-up appointment.   Your physician has requested that you have a carotid duplex. This test is an ultrasound of the carotid arteries in your neck. It looks at blood flow through these arteries that supply the brain with blood. Allow one hour for this exam. There are no restrictions or special instructions.   Your physician recommends that you return for lab work in: today

## 2010-08-09 NOTE — Assessment & Plan Note (Signed)
Management per electrophysiology. 

## 2010-08-09 NOTE — Assessment & Plan Note (Signed)
Patient counseled on discontinuing. Check potassium and renal function.

## 2010-08-09 NOTE — Assessment & Plan Note (Signed)
Continue aspirin, statin, ACE inhibitor and beta blocker. Repeat myoview when he returns in one year.

## 2010-08-09 NOTE — Assessment & Plan Note (Signed)
Continue aspirin and statin. Schedule followup carotid Dopplers. 

## 2010-08-10 ENCOUNTER — Telehealth: Payer: Self-pay | Admitting: Cardiology

## 2010-08-10 ENCOUNTER — Encounter: Payer: Self-pay | Admitting: *Deleted

## 2010-08-10 NOTE — Telephone Encounter (Signed)
Per pt call, pt was in the office yesterday and had some blood work done yesterday. Pt said shortly after he left office, Wynona Canes called him, and pt was told to return call this morning. Pt does not know what the phone call was pertaining to but thought it could be because something was wrong with his blood work. Please return pt call to advise/discuss.

## 2010-08-10 NOTE — Telephone Encounter (Signed)
Spoke with pt, aware labs are normal. Copy mailed to pt [name]

## 2010-09-02 ENCOUNTER — Encounter (INDEPENDENT_AMBULATORY_CARE_PROVIDER_SITE_OTHER): Payer: BC Managed Care – PPO | Admitting: *Deleted

## 2010-09-02 ENCOUNTER — Encounter: Payer: BC Managed Care – PPO | Admitting: Cardiology

## 2010-09-02 DIAGNOSIS — I6529 Occlusion and stenosis of unspecified carotid artery: Secondary | ICD-10-CM

## 2010-09-16 ENCOUNTER — Telehealth: Payer: Self-pay | Admitting: *Deleted

## 2010-09-16 DIAGNOSIS — T82198A Other mechanical complication of other cardiac electronic device, initial encounter: Secondary | ICD-10-CM

## 2010-09-16 NOTE — Telephone Encounter (Signed)
Checking lead 

## 2010-09-20 ENCOUNTER — Ambulatory Visit (INDEPENDENT_AMBULATORY_CARE_PROVIDER_SITE_OTHER): Payer: BC Managed Care – PPO | Admitting: Internal Medicine

## 2010-09-20 ENCOUNTER — Encounter: Payer: Self-pay | Admitting: Internal Medicine

## 2010-09-20 DIAGNOSIS — I5022 Chronic systolic (congestive) heart failure: Secondary | ICD-10-CM

## 2010-09-20 DIAGNOSIS — I2589 Other forms of chronic ischemic heart disease: Secondary | ICD-10-CM

## 2010-09-20 NOTE — Patient Instructions (Signed)
Your physician recommends that you schedule a follow-up appointment in: 3 months with Kristin/Paula.  Your physician recommends that you continue on your current medications as directed. Please refer to the Current Medication list given to you today.

## 2010-09-21 NOTE — Assessment & Plan Note (Addendum)
[facility]                        ELECTROPHYSIOLOGY OFFICE NOTE  NAME:[name], [name]                         MRN:          [id number] DATE:09/20/2010                            DOB:          Jun 18, 1955   Mr. Weilbacher is seen in followup for ischemic heart disease with prior ICD. He is having no specific cardiovascular complaints.  Denying chest pain or shortness of breath.  He does note that his left leg has had problems as he walks over sort of about 100 yards or so, he finds his left leg is very heavy.  He has known peripheral vascular disease with ABIs number of years ago demonstrating some degree of peripheral vascular disease, right greater than left.  He also has issues with balance.  This is a couple of different forms. There is orthostatic intolerance noted particularly in the morning.  He also finds himself drifting to the left.  Periodically he has had problems with tingling in his left arm as well.  MEDICATIONS:  Metformin, carvedilol, aspirin, benazepril, Lipitor.  PHYSICAL EXAMINATION:  VITAL SIGNS:  His blood pressure 137/85, his pulse is 89. NECK:  There is no nystagmus.  Neck veins are flat.  Carotids are brisk and full. BACK:  Without kyphosis. LUNGS:  Clear. HEART:  Sounds are regular without murmurs or gallops. EXTREMITIES:  Without clubbing, cyanosis, or venous insufficiency or changes on extremities bilaterally.  His Romberg was negative.  Finger- to-nose was normal.  Interrogation of his St. Jude device demonstrates a battery voltage of 2.56, his R-wave was 9.4 with impedance of 540, threshold 0.75.  High- voltage impedance was 36 ohms.  IMPRESSION: 1. Ischemic heart disease. 2. Status post ICD for #1 without intercurrent discharges. 3. Balance issues, question neurological. 4. Left leg heaviness, question claudication.  We will obtain ABIs.  I have asked him to follow up with primary care physician for possible  neurological evaluation.  We will continue his cardiac meds and we will see him in 3 months' time.    [name], MD, [facility] Electronically Signed   SCK/MedQ  DD: 09/20/2010  DT: 09/20/2010  Job #: [id number]

## 2010-09-22 ENCOUNTER — Encounter: Payer: BC Managed Care – PPO | Admitting: Internal Medicine

## 2010-09-27 NOTE — Progress Notes (Signed)
dictated

## 2010-10-26 ENCOUNTER — Other Ambulatory Visit: Payer: Self-pay | Admitting: Family Medicine

## 2010-10-26 DIAGNOSIS — M6281 Muscle weakness (generalized): Secondary | ICD-10-CM

## 2010-10-28 ENCOUNTER — Ambulatory Visit
Admission: RE | Admit: 2010-10-28 | Discharge: 2010-10-28 | Disposition: A | Discharge status: Home / Self Care | Payer: BC Managed Care – PPO | Source: Ambulatory Visit | Attending: Family Medicine | Admitting: Family Medicine

## 2010-10-28 DIAGNOSIS — M6281 Muscle weakness (generalized): Secondary | ICD-10-CM

## 2010-12-08 ENCOUNTER — Other Ambulatory Visit: Payer: Self-pay | Admitting: *Deleted

## 2010-12-08 DIAGNOSIS — T82110A Breakdown (mechanical) of cardiac electrode, initial encounter: Secondary | ICD-10-CM

## 2010-12-19 ENCOUNTER — Other Ambulatory Visit: Payer: Self-pay | Admitting: *Deleted

## 2010-12-19 ENCOUNTER — Encounter: Payer: Self-pay | Admitting: Internal Medicine

## 2010-12-19 ENCOUNTER — Ambulatory Visit (INDEPENDENT_AMBULATORY_CARE_PROVIDER_SITE_OTHER)
Admission: RE | Admit: 2010-12-19 | Discharge: 2010-12-19 | Disposition: A | Discharge status: Home / Self Care | Payer: BC Managed Care – PPO | Source: Ambulatory Visit | Attending: Cardiology | Admitting: Cardiology

## 2010-12-19 ENCOUNTER — Ambulatory Visit (INDEPENDENT_AMBULATORY_CARE_PROVIDER_SITE_OTHER): Payer: BC Managed Care – PPO | Admitting: *Deleted

## 2010-12-19 DIAGNOSIS — I428 Other cardiomyopathies: Secondary | ICD-10-CM

## 2010-12-19 DIAGNOSIS — T82110A Breakdown (mechanical) of cardiac electrode, initial encounter: Secondary | ICD-10-CM

## 2010-12-19 DIAGNOSIS — Z95810 Presence of automatic (implantable) cardiac defibrillator: Secondary | ICD-10-CM

## 2010-12-19 DIAGNOSIS — T82198A Other mechanical complication of other cardiac electronic device, initial encounter: Secondary | ICD-10-CM

## 2010-12-19 LAB — ICD DEVICE OBSERVATION
BRDY-0002RV: 40 {beats}/min
DEV-0020ICD: NEGATIVE
HV IMPEDENCE: 36 Ohm
RV LEAD AMPLITUDE: 10.9 mv
TOT-0006: 20120918000000
TOT-0010: 50
TZAT-0012FASTVT: 200 ms
TZAT-0012SLOWVT: 200 ms
TZAT-0013FASTVT: 1
TZAT-0013SLOWVT: 3
TZAT-0018FASTVT: NEGATIVE
TZAT-0018SLOWVT: NEGATIVE
TZAT-0019FASTVT: 7.5 V
TZAT-0019SLOWVT: 7.5 V
TZAT-0020FASTVT: 1 ms
TZON-0003FASTVT: 285 ms
TZON-0004FASTVT: 24
TZON-0005FASTVT: 6
TZON-0005SLOWVT: 6
TZON-0008SLOWVT: 40 ms
TZON-0010FASTVT: 80 ms
TZON-0010SLOWVT: 80 ms
TZST-0001FASTVT: 3
TZST-0001FASTVT: 5
TZST-0003FASTVT: 36 J
TZST-0003FASTVT: 36 J
TZST-0003SLOWVT: 25 J
TZST-0003SLOWVT: 36 J
TZST-0003SLOWVT: 36 J
VENTRICULAR PACING ICD: 0 pct

## 2010-12-19 MED ORDER — CARVEDILOL 3.125 MG PO TABS: 3.1250 mg | ORAL_TABLET | Freq: Two times a day (BID) | ORAL | Status: DC

## 2010-12-19 MED ORDER — METFORMIN HCL 1000 MG PO TABS: 1000.0000 mg | ORAL_TABLET | Freq: Two times a day (BID) | ORAL | Status: DC

## 2010-12-19 MED ORDER — BENAZEPRIL HCL 10 MG PO TABS: 10.0000 mg | ORAL_TABLET | Freq: Every day | ORAL | Status: DC

## 2010-12-19 NOTE — Progress Notes (Signed)
icd check in clinic  

## 2011-02-09 ENCOUNTER — Telehealth: Payer: Self-pay | Admitting: Internal Medicine

## 2011-02-09 NOTE — Telephone Encounter (Signed)
All Cardiac faxed to [facility] @ [phone] 02/09/11/KM

## 2011-03-08 ENCOUNTER — Encounter: Payer: Self-pay | Admitting: Internal Medicine

## 2011-03-08 ENCOUNTER — Ambulatory Visit (INDEPENDENT_AMBULATORY_CARE_PROVIDER_SITE_OTHER): Payer: BC Managed Care – PPO | Admitting: *Deleted

## 2011-03-08 DIAGNOSIS — I5022 Chronic systolic (congestive) heart failure: Secondary | ICD-10-CM

## 2011-03-08 DIAGNOSIS — I2589 Other forms of chronic ischemic heart disease: Secondary | ICD-10-CM

## 2011-03-08 LAB — ICD DEVICE OBSERVATION
BATTERY VOLTAGE: 2.56 V
BRDY-0002RV: 40 {beats}/min
CHARGE TIME: 0 s
RV LEAD IMPEDENCE ICD: 530 Ohm
TZAT-0004FASTVT: 8
TZAT-0004SLOWVT: 8
TZAT-0012FASTVT: 200 ms
TZAT-0012SLOWVT: 200 ms
TZAT-0013SLOWVT: 3
TZAT-0018SLOWVT: NEGATIVE
TZAT-0019FASTVT: 7.5 V
TZAT-0019SLOWVT: 7.5 V
TZAT-0020FASTVT: 1 ms
TZON-0003SLOWVT: 330 ms
TZON-0004FASTVT: 24
TZON-0008FASTVT: 40 ms
TZON-0008SLOWVT: 40 ms
TZON-0010FASTVT: 80 ms
TZST-0001FASTVT: 2
TZST-0001FASTVT: 4
TZST-0001SLOWVT: 5
TZST-0003FASTVT: 36 J
TZST-0003FASTVT: 36 J
TZST-0003SLOWVT: 25 J
TZST-0003SLOWVT: 36 J
TZST-0003SLOWVT: 36 J

## 2011-03-08 NOTE — Progress Notes (Signed)
ICD check 

## 2011-06-08 ENCOUNTER — Encounter: Payer: Self-pay | Admitting: Internal Medicine

## 2011-06-08 ENCOUNTER — Ambulatory Visit (INDEPENDENT_AMBULATORY_CARE_PROVIDER_SITE_OTHER): Payer: BC Managed Care – PPO | Admitting: *Deleted

## 2011-06-08 DIAGNOSIS — I5022 Chronic systolic (congestive) heart failure: Secondary | ICD-10-CM

## 2011-06-08 DIAGNOSIS — I2589 Other forms of chronic ischemic heart disease: Secondary | ICD-10-CM

## 2011-06-08 LAB — ICD DEVICE OBSERVATION
DEV-0020ICD: NEGATIVE
DEVICE MODEL ICD: 248599
RV LEAD AMPLITUDE: 10.4 mv
RV LEAD THRESHOLD: 0.75 V
TOT-0008: 0
TOT-0009: 1
TZAT-0001SLOWVT: 1
TZAT-0004FASTVT: 8
TZAT-0004SLOWVT: 8
TZAT-0012SLOWVT: 200 ms
TZAT-0013FASTVT: 1
TZAT-0013SLOWVT: 3
TZAT-0018FASTVT: NEGATIVE
TZAT-0019FASTVT: 7.5 V
TZON-0003FASTVT: 285 ms
TZON-0004FASTVT: 24
TZON-0005SLOWVT: 6
TZON-0008FASTVT: 40 ms
TZON-0010FASTVT: 80 ms
TZST-0001FASTVT: 3
TZST-0001SLOWVT: 3
TZST-0001SLOWVT: 5
TZST-0003FASTVT: 36 J
TZST-0003FASTVT: 36 J
TZST-0003FASTVT: 36 J
TZST-0003SLOWVT: 25 J
TZST-0003SLOWVT: 36 J
TZST-0003SLOWVT: 36 J

## 2011-06-08 NOTE — Progress Notes (Signed)
ICD check 

## 2011-09-05 ENCOUNTER — Encounter: Payer: Self-pay | Admitting: *Deleted

## 2011-09-12 ENCOUNTER — Encounter: Payer: Self-pay | Admitting: Internal Medicine

## 2011-09-12 ENCOUNTER — Ambulatory Visit (INDEPENDENT_AMBULATORY_CARE_PROVIDER_SITE_OTHER): Payer: BC Managed Care – PPO | Admitting: Internal Medicine

## 2011-09-12 VITALS — HR 90 | Ht 71.0 in | Wt 224.0 lb

## 2011-09-12 DIAGNOSIS — I10 Essential (primary) hypertension: Secondary | ICD-10-CM

## 2011-09-12 DIAGNOSIS — Z95810 Presence of automatic (implantable) cardiac defibrillator: Secondary | ICD-10-CM

## 2011-09-12 DIAGNOSIS — I2589 Other forms of chronic ischemic heart disease: Secondary | ICD-10-CM

## 2011-09-12 DIAGNOSIS — I6529 Occlusion and stenosis of unspecified carotid artery: Secondary | ICD-10-CM

## 2011-09-12 DIAGNOSIS — E785 Hyperlipidemia, unspecified: Secondary | ICD-10-CM

## 2011-09-12 DIAGNOSIS — I5022 Chronic systolic (congestive) heart failure: Secondary | ICD-10-CM

## 2011-09-12 DIAGNOSIS — I739 Peripheral vascular disease, unspecified: Secondary | ICD-10-CM

## 2011-09-12 DIAGNOSIS — F17200 Nicotine dependence, unspecified, uncomplicated: Secondary | ICD-10-CM

## 2011-09-12 LAB — ICD DEVICE OBSERVATION
BATTERY VOLTAGE: 2.56 V
CHARGE TIME: 12.5 s
RV LEAD AMPLITUDE: 10.2 mv
RV LEAD IMPEDENCE ICD: 530 Ohm
RV LEAD THRESHOLD: 0.75 V
TOT-0008: 0
TOT-0009: 1
TZAT-0001FASTVT: 1
TZAT-0004FASTVT: 8
TZAT-0012SLOWVT: 200 ms
TZAT-0013SLOWVT: 3
TZAT-0018FASTVT: NEGATIVE
TZAT-0018SLOWVT: NEGATIVE
TZAT-0019FASTVT: 7.5 V
TZAT-0020FASTVT: 1 ms
TZAT-0020SLOWVT: 1 ms
TZON-0003FASTVT: 285 ms
TZON-0003SLOWVT: 330 ms
TZON-0004FASTVT: 24
TZON-0004SLOWVT: 35
TZON-0005SLOWVT: 6
TZON-0008FASTVT: 40 ms
TZON-0008SLOWVT: 40 ms
TZON-0010FASTVT: 80 ms
TZST-0001FASTVT: 2
TZST-0001FASTVT: 4
TZST-0001SLOWVT: 4
TZST-0001SLOWVT: 5
TZST-0003FASTVT: 36 J
TZST-0003FASTVT: 36 J
TZST-0003SLOWVT: 36 J
TZST-0003SLOWVT: 36 J
VENTRICULAR PACING ICD: 0 pct

## 2011-09-12 NOTE — Assessment & Plan Note (Signed)
Apparently not at goal at this statin for recently increased by his PCP; we'll wait his laboratories

## 2011-09-12 NOTE — Assessment & Plan Note (Signed)
With ongoing congestive heart failure symptoms and cardiomyopathy, and the appropriate to consider the addition of Aldactone we will get his kidney function potassium measurements and then make a decision his labs are drawn recently by his PCP

## 2011-09-12 NOTE — Patient Instructions (Addendum)
Your physician recommends that you schedule a follow-up appointment in: 3 months with Kristin/Paula in the device clinic.  Your physician wants you to follow-up in: 6 months with Dr. Jens Som & 1 year with Dr. Graciela Husbands. You will receive a reminder letter in the mail two months in advance. If you don't receive a letter, please call our office to schedule the follow-up appointment.  Your physician recommends that you continue on your current medications as directed. Please refer to the Current Medication list given to you today.  Your physician has requested that you have a carotid duplex. This test is an ultrasound of the carotid arteries in your neck. It looks at blood flow through these arteries that supply the brain with blood. Allow one hour for this exam. There are no restrictions or special instructions.  Your physician has requested that you have a lower extremity arterial duplex. This test is an ultrasound of the arteries in the legs. It looks at arterial blood flow in the legs. Allow one hour for Lower Arterial scans. There are no restrictions or special instructions

## 2011-09-12 NOTE — Assessment & Plan Note (Signed)
Still smoking

## 2011-09-12 NOTE — Progress Notes (Signed)
kf Patient Care Team: Sigmund Hazel, MD as PCP - General (Family Medicine)   HPI  [name] is a 56 y.o. male Seen in followup for ischemic heart disease with prior stenting of LAD/RCA and LCx with last Cath 2006 ICD.   He has known peripheral vascular disease with ABIs in remote past and carotid Dopplers in our office a year ago with 60-79% bilaterally.  He has stable dyspnea on exertion. He is poorly healing wounds on his feet from his diabetes. He does not have edema. He denies chest pain          Past Medical History  Diagnosis Date  . Hyperlipidemia     mixed  . Chronic systolic heart failure   . ICD (implantable cardiac defibrillator), dual, in situ     St. Jude for severe LVD EF 25% 2/07 explanted 2010. Medtronic Virtuoso II DR Dual-chamber cardiverter-defibrillator  with pocket revision, Dr. Graciela Husbands  . Coronary artery disease     native vessel. S/P AWMI '05 Stent LAD, RCA, CFX. Last Cath 2006 Mod restenosis LAD stent, others patent  . Tobacco abuse   . Ischemic cardiomyopathy   . Cerebrovascular disease   . Diabetes mellitus     Past Surgical History  Procedure Date  . Medtronic virtuoso ii dr dual-chamber cardioverter-defibrillation with pocket revison     Dr. Sherryl Manges    Current Outpatient Prescriptions  Medication Sig Dispense Refill  . aspirin 325 MG EC tablet Take 325 mg by mouth daily.        Marland Kitchen atorvastatin (LIPITOR) 80 MG tablet Take 80 mg by mouth daily.        . benazepril (LOTENSIN) 10 MG tablet Take 1 tablet (10 mg total) by mouth daily.  30 tablet  12  . carvedilol (COREG) 3.125 MG tablet Take 1 tablet (3.125 mg total) by mouth 2 (two) times daily with a meal.  60 tablet  12  . ezetimibe (ZETIA) 10 MG tablet Take 10 mg by mouth daily.      Marland Kitchen glimepiride (AMARYL) 4 MG tablet Take 4 mg by mouth daily before breakfast.      . metFORMIN (GLUCOPHAGE) 1000 MG tablet Take 1,000 mg by mouth 3 (three) times daily.        Allergies  Allergen Reactions    . Niacin     REACTION: Reaction not known    Review of Systems negative except from HPI and PMH  Physical Exam BP 123/79  Pulse 90  Ht 5\' 11"  (1.803 m)  Wt 224 lb (101.606 kg)  BMI 31.24 kg/m2 Well developed and well nourished in no acute distress HENT normal E scleral and icterus clear Neck Supple JVP flat; carotids brisk and full Clear to ausculation Regular rate and rhythm, no murmurs gallops or rub Soft with active bowel sounds No clubbing cyanosis none Edema there is dependent rubor in his left leg is in a walking boot Alert and oriented, grossly normal motor and sensory function Skin Warm and Dry    Assessment and  Plan

## 2011-09-12 NOTE — Assessment & Plan Note (Signed)
Under reasonable control.    

## 2011-09-12 NOTE — Assessment & Plan Note (Signed)
The patient's device was interrogated.  The information was reviewed. No changes were made in the programming.    

## 2011-09-12 NOTE — Assessment & Plan Note (Signed)
He needs ongoing surveillance of his lower extremity and carotid vascular disease. We'll order these.

## 2011-09-19 ENCOUNTER — Other Ambulatory Visit: Payer: Self-pay | Admitting: *Deleted

## 2011-09-19 ENCOUNTER — Encounter (INDEPENDENT_AMBULATORY_CARE_PROVIDER_SITE_OTHER): Payer: BC Managed Care – PPO

## 2011-09-19 DIAGNOSIS — I739 Peripheral vascular disease, unspecified: Secondary | ICD-10-CM

## 2011-09-25 ENCOUNTER — Encounter (INDEPENDENT_AMBULATORY_CARE_PROVIDER_SITE_OTHER): Payer: BC Managed Care – PPO

## 2011-09-25 DIAGNOSIS — I6529 Occlusion and stenosis of unspecified carotid artery: Secondary | ICD-10-CM

## 2011-09-25 DIAGNOSIS — H53129 Transient visual loss, unspecified eye: Secondary | ICD-10-CM

## 2011-10-25 ENCOUNTER — Encounter: Payer: Self-pay | Admitting: Cardiovascular Disease

## 2011-10-25 ENCOUNTER — Ambulatory Visit (INDEPENDENT_AMBULATORY_CARE_PROVIDER_SITE_OTHER): Payer: BC Managed Care – PPO | Admitting: Cardiovascular Disease

## 2011-10-25 VITALS — BP 171/89 | HR 93 | Ht 71.0 in | Wt 220.0 lb

## 2011-10-25 DIAGNOSIS — I6529 Occlusion and stenosis of unspecified carotid artery: Secondary | ICD-10-CM | POA: Insufficient documentation

## 2011-10-25 DIAGNOSIS — I739 Peripheral vascular disease, unspecified: Secondary | ICD-10-CM

## 2011-10-25 NOTE — Assessment & Plan Note (Addendum)
The patient has claudication on the left side which does not seem to be lifestyle limiting. His ABI was moderately reduced. There was evidence of flush occlusion of the left SFA with reconstitution distally. This is a very long segment of total occlusion and likely not approachable for endovascular repair. Also the patency rate is very low in that length of occlusion even if stenting is performed. Surgical  revascularization is an option but his symptoms do not seem to be significant enough to warrant this. Thus, I recommend continuing medical therapy and exercise program. I had a prolonged discussion with the patient about the importance of smoking cessation. I explained to him that he is at high risk for limb loss if he continues to smoke. Due to his heart failure and reduced LV systolic function, I do not recommend treatment with Pletal. I will have the patient followup with me in a yearly basis with ABI. I also advised the patient to take all his medications as he has not taken any in over a week.

## 2011-10-25 NOTE — Assessment & Plan Note (Signed)
This has been stable since last study. Probably reasonable to screen on an annual basis.

## 2011-10-25 NOTE — Progress Notes (Signed)
HPI  [name] is a 79 gentleman who has a history of coronary artery disease and ischemic cardiomyopathy as well as a prior ICD. The patient has had previous three-vessel stenting. His ejection fraction was 31% in 2010. He also has known moderate bilateral carotid stenosis with no previous history of stroke.  Abdominal ultrasound in February 2011 showed no aneurysm. He is followed by Dr. Jens [name] and Dr. Graciela [name]. In the past, he was noted to have SFA disease bilaterally which was not totally occlusive. He underwent recent ABI and arterial duplex ultrasound. This showed normal right ABI and moderately reduced on the left side with evidence of flush occlusion of the left SFA from the ostium all the way distally with reconstitution via collaterals. Toe pressure was reduced on the left side but adequate for tissue perfusion. Patient complains of left calf claudication which happens with variable distance and is not consistent. On many days, he is able to walk more than half a mile. He does complain of spasm in the muscles mostly at rest. He works at [employer]. He has not taken his medications for about a week as he ran out and could not afford his medications in detail he gets paid. Unfortunately, he continues to smoke.  Allergies  Allergen Reactions  . Niacin     REACTION: Reaction not known     Current Outpatient Prescriptions on File Prior to Visit  Medication Sig Dispense Refill  . aspirin 325 MG EC tablet Take 325 mg by mouth daily.        . benazepril (LOTENSIN) 10 MG tablet Take 1 tablet (10 mg total) by mouth daily.  30 tablet  12  . carvedilol (COREG) 3.125 MG tablet Take 1 tablet (3.125 mg total) by mouth 2 (two) times daily with a meal.  60 tablet  12  . ezetimibe (ZETIA) 10 MG tablet Take 10 mg by mouth daily.      Marland Kitchen glimepiride (AMARYL) 4 MG tablet Take 4 mg by mouth daily before breakfast.      . metFORMIN (GLUCOPHAGE) 1000 MG tablet Take 1,000 mg by mouth 3 (three) times daily.          Past Medical History  Diagnosis Date  . Hyperlipidemia     mixed  . Chronic systolic heart failure   . ICD (implantable cardiac defibrillator), dual, in situ     St. Jude for severe LVD EF 25% 2/07 explanted 2010. Medtronic Virtuoso II DR Dual-chamber cardiverter-defibrillator  with pocket revision, Dr. Graciela [name]  . Coronary artery disease     native vessel. S/P AWMI '05 Stent LAD, RCA, CFX. Last Cath 2006 Mod restenosis LAD stent, others patent  . Tobacco abuse   . Ischemic cardiomyopathy   . Cerebrovascular disease   . Diabetes mellitus      Past Surgical History  Procedure Date  . Medtronic virtuoso ii dr dual-chamber cardioverter-defibrillation with pocket revison     Dr. Sherryl [name]     Family History  Problem Relation Age of Onset  . Diabetes Mother   . Coronary artery disease Father      History   Social History  . Marital Status: Single    Spouse Name: N/A    Number of Children: N/A  . Years of Education: N/A   Occupational History  . [employer]    Full time   Social History Main Topics  . Smoking status: Current Every Day Smoker -- 1.0 packs/day for 33 years  Types: Cigarettes  . Smokeless tobacco: Not on file  . Alcohol Use: No  . Drug Use: No     former Cocain, Acid, Marijuana - 25 years ago  . Sexually Active:    Other Topics Concern  . Not on file   Social History Narrative   Divorced.     PHYSICAL EXAM   BP 171/89  Pulse 93  Ht 5\' 11"  (1.803 m)  Wt 99.791 kg (220 lb)  BMI 30.68 kg/m2  SpO2 98% Constitutional: He is oriented to person, place, and time. He appears well-developed and well-nourished. No distress.  HENT: No nasal discharge.  Head: Normocephalic and atraumatic.  Eyes: Pupils are equal and round. Right eye exhibits no discharge. Left eye exhibits no discharge.  Neck: Normal range of motion. Neck supple. No JVD present. No thyromegaly present.  Cardiovascular: Normal rate, regular rhythm, normal heart sounds  and. Exam reveals no gallop and no friction rub. No murmur heard.  Pulmonary/Chest: Effort normal and breath sounds normal. No stridor. No respiratory distress. He has no wheezes. He has no rales. He exhibits no tenderness.  Abdominal: Soft. Bowel sounds are normal. He exhibits no distension. There is no tenderness. There is no rebound and no guarding.  Musculoskeletal: Normal range of motion. He exhibits no edema and no tenderness.  Neurological: He is alert and oriented to person, place, and time. Coordination normal.  Skin: Skin is warm and dry. No rash noted. He is not diaphoretic. No erythema. No pallor.  Psychiatric: He has a normal mood and affect. His behavior is normal. Judgment and thought content normal.  Vascular: Femoral pulses are normal bilaterally. Distal pulses are diminished on the right side and absent on the left side. Patient has hyperpigmentation below the knees. No ulcers.     EKG:   ASSESSMENT AND PLAN

## 2011-10-25 NOTE — Patient Instructions (Addendum)
Your physician wants you to follow-up in: 1 year.   You will receive a reminder letter in the mail two months in advance. If you don't receive a letter, please call our office to schedule the follow-up appointment.  Your physician has requested that you have an ankle brachial index (ABI) in one year prior to your next appt.  During this test an ultrasound and blood pressure cuff are used to evaluate the arteries that supply the arms and legs with blood. Allow thirty minutes for this exam. There are no restrictions or special instructions.

## 2011-12-14 ENCOUNTER — Encounter: Payer: Self-pay | Admitting: Internal Medicine

## 2011-12-14 ENCOUNTER — Ambulatory Visit (INDEPENDENT_AMBULATORY_CARE_PROVIDER_SITE_OTHER): Payer: BC Managed Care – PPO | Admitting: *Deleted

## 2011-12-14 DIAGNOSIS — I5022 Chronic systolic (congestive) heart failure: Secondary | ICD-10-CM

## 2011-12-14 LAB — ICD DEVICE OBSERVATION
DEV-0020ICD: NEGATIVE
DEVICE MODEL ICD: 248599
RV LEAD AMPLITUDE: 12.1 mv
RV LEAD THRESHOLD: 0.75 V
TOT-0008: 0
TOT-0009: 1
TOT-0010: 58
TZAT-0001FASTVT: 1
TZAT-0004FASTVT: 8
TZAT-0012FASTVT: 200 ms
TZAT-0018SLOWVT: NEGATIVE
TZAT-0019SLOWVT: 7.5 V
TZAT-0020FASTVT: 1 ms
TZON-0003FASTVT: 285 ms
TZON-0003SLOWVT: 330 ms
TZON-0004FASTVT: 24
TZON-0004SLOWVT: 35
TZON-0008FASTVT: 40 ms
TZON-0008SLOWVT: 40 ms
TZON-0010FASTVT: 80 ms
TZON-0010SLOWVT: 80 ms
TZST-0001FASTVT: 4
TZST-0001FASTVT: 5
TZST-0001SLOWVT: 2
TZST-0003FASTVT: 36 J
TZST-0003FASTVT: 36 J
TZST-0003SLOWVT: 36 J

## 2011-12-14 NOTE — Patient Instructions (Addendum)
Return office visit 01/15/12 @ 9:30am with the device clinic for a battery check.

## 2011-12-14 NOTE — Progress Notes (Signed)
ICD check 

## 2012-01-16 ENCOUNTER — Encounter: Payer: Self-pay | Admitting: Cardiology

## 2012-01-16 ENCOUNTER — Ambulatory Visit (INDEPENDENT_AMBULATORY_CARE_PROVIDER_SITE_OTHER): Payer: BC Managed Care – PPO | Admitting: Cardiology

## 2012-01-16 VITALS — BP 110/70 | HR 103 | Ht 72.0 in | Wt 205.0 lb

## 2012-01-16 DIAGNOSIS — I255 Ischemic cardiomyopathy: Secondary | ICD-10-CM

## 2012-01-16 DIAGNOSIS — Z4502 Encounter for adjustment and management of automatic implantable cardiac defibrillator: Secondary | ICD-10-CM

## 2012-01-16 DIAGNOSIS — Z95810 Presence of automatic (implantable) cardiac defibrillator: Secondary | ICD-10-CM

## 2012-01-16 DIAGNOSIS — I2589 Other forms of chronic ischemic heart disease: Secondary | ICD-10-CM

## 2012-01-16 LAB — ICD DEVICE OBSERVATION
DEV-0020ICD: NEGATIVE
DEVICE MODEL ICD: 248599
TZAT-0001SLOWVT: 1
TZAT-0012SLOWVT: 200 ms
TZAT-0013SLOWVT: 3
TZAT-0018FASTVT: NEGATIVE
TZAT-0018SLOWVT: NEGATIVE
TZAT-0019SLOWVT: 7.5 V
TZAT-0020SLOWVT: 1 ms
TZON-0003FASTVT: 285 ms
TZON-0003SLOWVT: 330 ms
TZON-0004SLOWVT: 35
TZON-0005SLOWVT: 6
TZON-0008FASTVT: 40 ms
TZON-0010FASTVT: 80 ms
TZST-0001FASTVT: 2
TZST-0001FASTVT: 4
TZST-0001FASTVT: 5
TZST-0001SLOWVT: 3
TZST-0001SLOWVT: 4
TZST-0003FASTVT: 36 J
TZST-0003SLOWVT: 36 J
TZST-0003SLOWVT: 36 J

## 2012-01-16 NOTE — Progress Notes (Signed)
ICD battery check only. Nearing ERI. Return to device clinic in 1 month. See PaceArt report.

## 2012-01-16 NOTE — Patient Instructions (Addendum)
Your physician recommends that you schedule a follow-up appointment in: 1 month with device clinic

## 2012-02-05 ENCOUNTER — Encounter: Payer: Self-pay | Admitting: Internal Medicine

## 2012-02-26 ENCOUNTER — Encounter: Payer: Self-pay | Admitting: Internal Medicine

## 2012-02-26 ENCOUNTER — Ambulatory Visit (INDEPENDENT_AMBULATORY_CARE_PROVIDER_SITE_OTHER): Payer: BC Managed Care – PPO | Admitting: *Deleted

## 2012-02-26 ENCOUNTER — Other Ambulatory Visit: Payer: Self-pay

## 2012-02-26 DIAGNOSIS — I2589 Other forms of chronic ischemic heart disease: Secondary | ICD-10-CM

## 2012-02-26 LAB — ICD DEVICE OBSERVATION
BATTERY VOLTAGE: 2.55 V
BRDY-0002RV: 40 {beats}/min
DEVICE MODEL ICD: 248599
HV IMPEDENCE: 48 Ohm
RV LEAD AMPLITUDE: 9.1 mv
RV LEAD THRESHOLD: 0.75 V
TOT-0007: 2
TOT-0008: 0
TZAT-0001SLOWVT: 1
TZAT-0004FASTVT: 8
TZAT-0012SLOWVT: 200 ms
TZAT-0018FASTVT: NEGATIVE
TZAT-0019SLOWVT: 7.5 V
TZAT-0020SLOWVT: 1 ms
TZON-0003FASTVT: 285 ms
TZON-0004SLOWVT: 35
TZON-0005SLOWVT: 6
TZON-0008FASTVT: 40 ms
TZON-0008SLOWVT: 40 ms
TZON-0010FASTVT: 80 ms
TZON-0010SLOWVT: 80 ms
TZST-0001FASTVT: 2
TZST-0001FASTVT: 3
TZST-0001FASTVT: 5
TZST-0001SLOWVT: 2
TZST-0001SLOWVT: 4
TZST-0003FASTVT: 36 J
TZST-0003FASTVT: 36 J
TZST-0003SLOWVT: 25 J
TZST-0003SLOWVT: 36 J
VENTRICULAR PACING ICD: 1 pct

## 2012-02-26 NOTE — Progress Notes (Signed)
defib check in clinic  

## 2012-03-18 ENCOUNTER — Other Ambulatory Visit: Payer: Self-pay | Admitting: Cardiology

## 2012-03-25 ENCOUNTER — Ambulatory Visit (INDEPENDENT_AMBULATORY_CARE_PROVIDER_SITE_OTHER): Payer: BC Managed Care – PPO | Admitting: *Deleted

## 2012-03-25 DIAGNOSIS — I2589 Other forms of chronic ischemic heart disease: Secondary | ICD-10-CM

## 2012-03-25 DIAGNOSIS — I255 Ischemic cardiomyopathy: Secondary | ICD-10-CM

## 2012-03-25 NOTE — Progress Notes (Signed)
Pt seen in clinic for follow up of ICD. Battery check only today.   No complaints of chest pain, shortness of breath, dizziness, palpitations, or shocks.  Device functioning normally at this time.  For full details, see PaceArt report.  No programming changes made today.  Plan to follow up in 1 months with Dr Graciela Husbands.   Gypsy Balsam, RN, BSN 03/25/2012 9:46 AM

## 2012-04-25 ENCOUNTER — Ambulatory Visit (INDEPENDENT_AMBULATORY_CARE_PROVIDER_SITE_OTHER): Payer: BC Managed Care – PPO | Admitting: Internal Medicine

## 2012-04-25 ENCOUNTER — Encounter: Payer: Self-pay | Admitting: Internal Medicine

## 2012-04-25 VITALS — BP 137/76 | HR 93 | Ht 72.0 in | Wt 211.0 lb

## 2012-04-25 DIAGNOSIS — I2589 Other forms of chronic ischemic heart disease: Secondary | ICD-10-CM

## 2012-04-25 DIAGNOSIS — F17200 Nicotine dependence, unspecified, uncomplicated: Secondary | ICD-10-CM

## 2012-04-25 DIAGNOSIS — Z95810 Presence of automatic (implantable) cardiac defibrillator: Secondary | ICD-10-CM

## 2012-04-25 LAB — ICD DEVICE OBSERVATION
BATTERY VOLTAGE: 2.53 V
CHARGE TIME: 0 s
DEVICE MODEL ICD: 248599
RV LEAD AMPLITUDE: 9.8 mv
RV LEAD THRESHOLD: 0.75 V
TOT-0007: 2
TOT-0008: 0
TOT-0009: 1
TZAT-0001SLOWVT: 1
TZAT-0004FASTVT: 8
TZAT-0012SLOWVT: 200 ms
TZAT-0013FASTVT: 1
TZAT-0018FASTVT: NEGATIVE
TZAT-0019FASTVT: 7.5 V
TZAT-0020SLOWVT: 1 ms
TZON-0003FASTVT: 285 ms
TZON-0004FASTVT: 24
TZON-0005SLOWVT: 6
TZON-0010FASTVT: 80 ms
TZST-0001FASTVT: 3
TZST-0001FASTVT: 5
TZST-0001SLOWVT: 4
TZST-0001SLOWVT: 5
TZST-0003FASTVT: 36 J
TZST-0003FASTVT: 36 J
TZST-0003FASTVT: 36 J
TZST-0003SLOWVT: 25 J
TZST-0003SLOWVT: 36 J
VENTRICULAR PACING ICD: 0 pct

## 2012-04-25 NOTE — Assessment & Plan Note (Signed)
The patient's device was interrogated.  The information was reviewed. No changes were made in the programming.    

## 2012-04-25 NOTE — Progress Notes (Signed)
Patient Care Team: Sigmund Hazel, MD as PCP - General (Family Medicine)   HPI  [name] is a 57 y.o. male Seen in followup for ischemic heart disease with prior stenting of LAD/RCA and LCx with last Cath 2006. He has a previously implanted ICD that has been approaching ERI     .  He has known peripheral vascular disease with ABIs in remote past and carotid Dopplers in our office a year ago with 60-79% bilaterally.  He has stable dyspnea on exertion. He is poorly healing wounds on his feet from his diabetes. He does not have edema. He denies chest pain   Past Medical History  Diagnosis Date  . Hyperlipidemia     mixed  . Chronic systolic heart failure   . ICD (implantable cardiac defibrillator), dual, in situ     St. Jude for severe LVD EF 25% 2/07 explanted 2010. Medtronic Virtuoso II DR Dual-chamber cardiverter-defibrillator  with pocket revision, Dr. Graciela Husbands  . Coronary artery disease     native vessel. S/P AWMI '05 Stent LAD, RCA, CFX. Last Cath 2006 Mod restenosis LAD stent, others patent  . Tobacco abuse   . Ischemic cardiomyopathy   . Cerebrovascular disease   . Diabetes mellitus     Past Surgical History  Procedure Laterality Date  . Medtronic virtuoso ii dr dual-chamber cardioverter-defibrillation with pocket revison      Dr. Sherryl Manges    Current Outpatient Prescriptions  Medication Sig Dispense Refill  . aspirin 325 MG EC tablet Take 325 mg by mouth daily.        . benazepril (LOTENSIN) 10 MG tablet TAKE ONE TABLET BY MOUTH EVERY DAY  30 tablet  0  . carvedilol (COREG) 3.125 MG tablet TAKE ONE TABLET BY MOUTH TWICE DAILY WITH FOOD  60 tablet  0  . ezetimibe (ZETIA) 10 MG tablet Take 10 mg by mouth daily.      Marland Kitchen glimepiride (AMARYL) 4 MG tablet Take 4 mg by mouth daily before breakfast.      . Liraglutide (VICTOZA [place]) Inject into the skin. Currently on sliding scale      . metFORMIN (GLUCOPHAGE) 1000 MG tablet Take 500 mg by mouth 2 (two) times daily with a  meal.       . pioglitazone (ACTOS) 30 MG tablet Take 30 mg by mouth daily.       No current facility-administered medications for this visit.    Allergies  Allergen Reactions  . Niacin     REACTION: Reaction not known    Review of Systems negative except from HPI and PMH  Physical Exam BP 137/76  Pulse 93  Ht 6' (1.829 m)  Wt 211 lb (95.709 kg)  BMI 28.61 kg/m2  SpO2 100% Well developed and nourished in no acute distress HENT normal Neck supple with JVP-flat Clear Device pocket well healed; without hematoma or erythema  Regular rate and rhythm, no murmurs or gallops Abd-soft with active BS No Clubbing cyanosis edema Skin-warm and dry A & Oriented  Grossly normal sensory and motor function     Assessment and  Plan

## 2012-04-25 NOTE — Patient Instructions (Signed)
Your physician recommends that you schedule a follow-up appointment in: 1 month with device clinic.  Your physician recommends that you continue on your current medications as directed. Please refer to the Current Medication list given to you today.

## 2012-04-25 NOTE — Assessment & Plan Note (Signed)
Still smoking and trying

## 2012-04-25 NOTE — Assessment & Plan Note (Signed)
sta ble  On current meds

## 2012-05-30 ENCOUNTER — Ambulatory Visit (INDEPENDENT_AMBULATORY_CARE_PROVIDER_SITE_OTHER): Payer: BC Managed Care – PPO | Admitting: Cardiology

## 2012-05-30 VITALS — BP 142/78 | HR 88 | Resp 20

## 2012-05-30 DIAGNOSIS — Z4502 Encounter for adjustment and management of automatic implantable cardiac defibrillator: Secondary | ICD-10-CM

## 2012-05-30 DIAGNOSIS — Z95810 Presence of automatic (implantable) cardiac defibrillator: Secondary | ICD-10-CM

## 2012-05-30 LAB — ICD DEVICE OBSERVATION
DEV-0020ICD: NEGATIVE
TZAT-0001FASTVT: 1
TZAT-0004FASTVT: 8
TZAT-0012FASTVT: 200 ms
TZAT-0018SLOWVT: NEGATIVE
TZON-0003SLOWVT: 330 ms
TZON-0008SLOWVT: 40 ms
TZON-0010SLOWVT: 80 ms
TZST-0001FASTVT: 3
TZST-0001SLOWVT: 2
TZST-0001SLOWVT: 4
TZST-0001SLOWVT: 5
TZST-0003FASTVT: 36 J
TZST-0003FASTVT: 36 J
TZST-0003SLOWVT: 36 J

## 2012-05-30 NOTE — Progress Notes (Signed)
ICD battery check only. Battery voltage 2.52. Nearing ERI. See PaceArt report. Continue monthly device checks.

## 2012-06-18 ENCOUNTER — Encounter: Payer: Self-pay | Admitting: Internal Medicine

## 2012-06-21 ENCOUNTER — Ambulatory Visit (INDEPENDENT_AMBULATORY_CARE_PROVIDER_SITE_OTHER): Payer: BC Managed Care – PPO | Admitting: Cardiology

## 2012-06-21 ENCOUNTER — Encounter: Payer: Self-pay | Admitting: Cardiology

## 2012-06-21 VITALS — BP 138/80 | HR 89 | Ht 72.0 in | Wt 213.0 lb

## 2012-06-21 DIAGNOSIS — E785 Hyperlipidemia, unspecified: Secondary | ICD-10-CM

## 2012-06-21 DIAGNOSIS — I739 Peripheral vascular disease, unspecified: Secondary | ICD-10-CM

## 2012-06-21 DIAGNOSIS — Z95810 Presence of automatic (implantable) cardiac defibrillator: Secondary | ICD-10-CM

## 2012-06-21 DIAGNOSIS — F17200 Nicotine dependence, unspecified, uncomplicated: Secondary | ICD-10-CM

## 2012-06-21 DIAGNOSIS — I679 Cerebrovascular disease, unspecified: Secondary | ICD-10-CM

## 2012-06-21 DIAGNOSIS — I10 Essential (primary) hypertension: Secondary | ICD-10-CM

## 2012-06-21 DIAGNOSIS — I2589 Other forms of chronic ischemic heart disease: Secondary | ICD-10-CM

## 2012-06-21 DIAGNOSIS — I2510 Atherosclerotic heart disease of native coronary artery without angina pectoris: Secondary | ICD-10-CM

## 2012-06-21 NOTE — Assessment & Plan Note (Signed)
Continue aspirin and statin. 

## 2012-06-21 NOTE — Progress Notes (Signed)
HPI: [name] is a gentleman who has a history of coronary artery disease and ischemic cardiomyopathy as well as a prior ICD. The patient has had previous three-vessel stenting. His last Myoview was performed in June 2010. At that time there was a prior extensive scar in the basal inferior wall, apex and mid/distal anteroseptal wall. There was no ischemia. His ejection fraction was 31%. Abdominal ultrasound in February 2011 showed no aneurysm. Last carotid Dopplers were performed in Sept 2013. At that time he had 60-79% right and 40-59% left stenosis. F/u recommended in 6 months. Last ABIs were performed in Sept 2013; right was normal and on the left was moderate. Seen by Dr Kirke Corin and medical therapy recommended; also with h/o ICD. I last saw him in August of 2012. Since then, the patient has dyspnea with more extreme activities but not with routine activities. It is relieved with rest. It is not associated with chest pain. There is no orthopnea, PND or pedal edema. There is no syncope or palpitations. There is no exertional chest pain.   Current Outpatient Prescriptions  Medication Sig Dispense Refill  . aspirin 325 MG EC tablet Take 325 mg by mouth daily.        Marland Kitchen atorvastatin (LIPITOR) 80 MG tablet Take 80 mg by mouth daily.      . benazepril (LOTENSIN) 10 MG tablet TAKE ONE TABLET BY MOUTH EVERY DAY  30 tablet  0  . carvedilol (COREG) 3.125 MG tablet TAKE ONE TABLET BY MOUTH TWICE DAILY WITH FOOD  60 tablet  0  . ezetimibe (ZETIA) 10 MG tablet Take 10 mg by mouth daily.      Marland Kitchen glimepiride (AMARYL) 4 MG tablet Take 4 mg by mouth daily before breakfast.      . Liraglutide (VICTOZA [place]) Inject into the skin. Currently on sliding scale      . metFORMIN (GLUCOPHAGE) 1000 MG tablet Take 500 mg by mouth 2 (two) times daily with a meal. take two 500 mg  tabs twice daily      . pioglitazone (ACTOS) 30 MG tablet Take 30 mg by mouth daily.       No current facility-administered medications for this visit.      Past Medical History  Diagnosis Date  . Hyperlipidemia     mixed  . Chronic systolic heart failure   . ICD (implantable cardiac defibrillator), dual, in situ     St. Jude for severe LVD EF 25% 2/07 explanted 2010. Medtronic Virtuoso II DR Dual-chamber cardiverter-defibrillator  with pocket revision, Dr. Graciela Husbands  . Coronary artery disease     native vessel. S/P AWMI '05 Stent LAD, RCA, CFX. Last Cath 2006 Mod restenosis LAD stent, others patent  . Tobacco abuse   . Ischemic cardiomyopathy   . Cerebrovascular disease   . Diabetes mellitus     Past Surgical History  Procedure Laterality Date  . Medtronic virtuoso ii dr dual-chamber cardioverter-defibrillation with pocket revison      Dr. Sherryl Manges    History   Social History  . Marital Status: Single    Spouse Name: N/A    Number of Children: N/A  . Years of Education: N/A   Occupational History  . [employer]    Full time   Social History Main Topics  . Smoking status: Current Every Day Smoker -- 1.00 packs/day for 33 years    Types: Cigarettes  . Smokeless tobacco: Not on file  . Alcohol Use: No  .  Drug Use: No     Comment: former Cocain, Acid, Marijuana - 25 years ago  . Sexually Active:    Other Topics Concern  . Not on file   Social History Narrative   Divorced.    ROS: knee arthralgias but no fevers or chills, productive cough, hemoptysis, dysphasia, odynophagia, melena, hematochezia, dysuria, hematuria, rash, seizure activity, orthopnea, PND, pedal edema, claudication. Remaining systems are negative.  Physical Exam: Well-developed well-nourished in no acute distress.  Skin is warm and dry.  HEENT is normal.  Neck is supple.  Chest is clear to auscultation with normal expansion.  Cardiovascular exam is regular rate and rhythm.  Abdominal exam nontender or distended. No masses palpated. Extremities show trace edema. neuro grossly intact  ECG normal sinus rhythm at a rate of 89. Prior inferior  and lateral infarct. Nonspecific ST changes.

## 2012-06-21 NOTE — Assessment & Plan Note (Signed)
Continue aspirin and statin. Schedule followup carotid. 

## 2012-06-21 NOTE — Assessment & Plan Note (Signed)
Patient counseled on discontinuing. 

## 2012-06-21 NOTE — Patient Instructions (Addendum)
Your physician wants you to follow-up in: ONE YEAR WITH DR Shelda Pal will receive a reminder letter in the mail two months in advance. If you don't receive a letter, please call our office to schedule the follow-up appointment.  Your physician has requested that you have a carotid duplex. This test is an ultrasound of the carotid arteries in your neck. It looks at blood flow through these arteries that supply the brain with blood. Allow one hour for this exam. There are no restrictions or special instructions.   Your physician has requested that you have a lexiscan myoview. For further information please visit [email or web address] Please follow instruction sheet, as given.

## 2012-06-21 NOTE — Assessment & Plan Note (Addendum)
Continue ACE inhibitor and beta blocker. The patient has had difficulties with hypotension when I have tried to advance his medications previously.

## 2012-06-21 NOTE — Assessment & Plan Note (Signed)
Continue present blood pressure medications. 

## 2012-06-21 NOTE — Assessment & Plan Note (Signed)
Continue statin. 

## 2012-06-21 NOTE — Assessment & Plan Note (Signed)
Continue aspirin and statin. Schedule Myoview.

## 2012-06-21 NOTE — Assessment & Plan Note (Signed)
Followed by electrophysiology. 

## 2012-07-03 ENCOUNTER — Ambulatory Visit (INDEPENDENT_AMBULATORY_CARE_PROVIDER_SITE_OTHER): Payer: BC Managed Care – PPO | Admitting: *Deleted

## 2012-07-03 DIAGNOSIS — Z4502 Encounter for adjustment and management of automatic implantable cardiac defibrillator: Secondary | ICD-10-CM

## 2012-07-03 DIAGNOSIS — Z95810 Presence of automatic (implantable) cardiac defibrillator: Secondary | ICD-10-CM

## 2012-07-03 LAB — ICD DEVICE OBSERVATION: DEVICE MODEL ICD: 248599

## 2012-07-03 NOTE — Progress Notes (Signed)
Battery check only.  Device at 2.52V, ERI @ 2.45V.  ROV in 1 mo.  CZ

## 2012-07-09 ENCOUNTER — Encounter (INDEPENDENT_AMBULATORY_CARE_PROVIDER_SITE_OTHER): Payer: BC Managed Care – PPO

## 2012-07-09 ENCOUNTER — Ambulatory Visit (HOSPITAL_COMMUNITY): Payer: BC Managed Care – PPO | Attending: Cardiology | Admitting: Radiology

## 2012-07-09 VITALS — BP 109/64 | Ht 72.0 in | Wt 210.0 lb

## 2012-07-09 DIAGNOSIS — R0989 Other specified symptoms and signs involving the circulatory and respiratory systems: Secondary | ICD-10-CM | POA: Insufficient documentation

## 2012-07-09 DIAGNOSIS — E119 Type 2 diabetes mellitus without complications: Secondary | ICD-10-CM | POA: Insufficient documentation

## 2012-07-09 DIAGNOSIS — I428 Other cardiomyopathies: Secondary | ICD-10-CM | POA: Insufficient documentation

## 2012-07-09 DIAGNOSIS — Z95810 Presence of automatic (implantable) cardiac defibrillator: Secondary | ICD-10-CM | POA: Insufficient documentation

## 2012-07-09 DIAGNOSIS — I10 Essential (primary) hypertension: Secondary | ICD-10-CM | POA: Insufficient documentation

## 2012-07-09 DIAGNOSIS — R079 Chest pain, unspecified: Secondary | ICD-10-CM

## 2012-07-09 DIAGNOSIS — I6529 Occlusion and stenosis of unspecified carotid artery: Secondary | ICD-10-CM

## 2012-07-09 DIAGNOSIS — R0609 Other forms of dyspnea: Secondary | ICD-10-CM | POA: Insufficient documentation

## 2012-07-09 DIAGNOSIS — I739 Peripheral vascular disease, unspecified: Secondary | ICD-10-CM | POA: Insufficient documentation

## 2012-07-09 DIAGNOSIS — I2510 Atherosclerotic heart disease of native coronary artery without angina pectoris: Secondary | ICD-10-CM | POA: Insufficient documentation

## 2012-07-09 DIAGNOSIS — I679 Cerebrovascular disease, unspecified: Secondary | ICD-10-CM

## 2012-07-09 DIAGNOSIS — F17200 Nicotine dependence, unspecified, uncomplicated: Secondary | ICD-10-CM | POA: Insufficient documentation

## 2012-07-09 DIAGNOSIS — I252 Old myocardial infarction: Secondary | ICD-10-CM | POA: Insufficient documentation

## 2012-07-09 DIAGNOSIS — R002 Palpitations: Secondary | ICD-10-CM | POA: Insufficient documentation

## 2012-07-09 DIAGNOSIS — I779 Disorder of arteries and arterioles, unspecified: Secondary | ICD-10-CM | POA: Insufficient documentation

## 2012-07-09 MED ORDER — REGADENOSON 0.4 MG/5ML IV SOLN
0.4000 mg | Freq: Once | INTRAVENOUS | Status: AC
  Administered 2012-07-09: 0.4 mg via INTRAVENOUS

## 2012-07-09 MED ORDER — TECHNETIUM TC 99M SESTAMIBI GENERIC - CARDIOLITE
33.0000 | Freq: Once | INTRAVENOUS | Status: AC | PRN
  Administered 2012-07-09: 33 via INTRAVENOUS

## 2012-07-09 MED ORDER — TECHNETIUM TC 99M SESTAMIBI GENERIC - CARDIOLITE
11.0000 | Freq: Once | INTRAVENOUS | Status: AC | PRN
  Administered 2012-07-09: 11 via INTRAVENOUS

## 2012-07-09 NOTE — Progress Notes (Signed)
[facility] SITE 3 NUCLEAR MED [address] [place], [place] [address] [phone]    Cardiology Nuclear Med Study  [name] is a 57 y.o. male     MRN : [id number]     DOB: 24-Jun-1955  Procedure Date: 07/09/2012  Nuclear Med Background Indication for Stress Test:  Evaluation for Ischemia and Stent Patency History:  Cardiomyopathy '05 MI-Stents-LAD RCA CFX '06 Heart Cath mod CAD restenosis LAD other stents patent  EF:25% '10 MPS: EF; 31% (-) ischemia extensive scar, '07,'10 Defibrillator H/O Cocaine/ Acid Abuse Cardiac Risk Factors: Carotid Disease, Hypertension, NIDDM, PVD and Smoker  Symptoms:  DOE and Palpitations   Nuclear Pre-Procedure Caffeine/Decaff Intake:  None NPO After: 7:00pm   Lungs:  clear O2 Sat: 98% on room air. IV 0.9% NS with Angio Cath:  22g  IV Site: R Hand  IV Started by:  Cathlyn Parsons, RN  Chest Size (in):  48 Cup Size: n/a  Height: 6' (1.829 m)  Weight:  210 lb (95.255 kg)  BMI:  Body mass index is 28.47 kg/(m^2). Tech Comments:No Coreg x 3 days. Patient has ran out of medication. No diabetic meds this am.    Nuclear Med Study 1 or 2 day study: 1 day  Stress Test Type:  Eugenie Birks  Reading MD: [name], MD  Order Authorizing Provider:  Ripley Fraise  Resting Radionuclide: Technetium 60m Sestamibi  Resting Radionuclide Dose: 11.0 mCi   Stress Radionuclide:  Technetium 77m Sestamibi  Stress Radionuclide Dose: 33.0 mCi           Stress Protocol Rest HR: 81 Stress HR: 104  Rest BP: 109/64 Stress BP: 115/70  Exercise Time (min): n/a METS: n/a   Predicted Max HR: 163 bpm % Max HR: 63.8 bpm Rate Pressure Product: 21308   Dose of Adenosine (mg):  n/a Dose of Lexiscan: 0.4 mg  Dose of Atropine (mg): n/a Dose of Dobutamine: n/a mcg/kg/min (at max HR)  Stress Test Technologist: Milana Na, EMT-P  Nuclear Technologist:  Doyne Keel, CNMT     Rest Procedure:  Myocardial perfusion imaging was performed at rest 45  minutes following the intravenous administration of Technetium 48m Sestamibi. Rest ECG: Sinus rhythm, anterior MI.  Stress Procedure:  The patient received IV Lexiscan 0.4 mg over 15-seconds.  Technetium 76m Sestamibi injected at 30-seconds. This patient had no symptoms with the Lexiscan injection Quantitative spect images were obtained after a 45 minute delay. Stress ECG: No significant ST segment change suggestive of ischemia.  QPS Raw Data Images:  Acquisition technically good; LVE. Stress Images:  There is decreased uptake in the anterior, inferior, septal, and apical wall. Rest Images:  There is decreased uptake in the anterior, inferior, septal, and apical wall. Subtraction (SDS):  There is a fixed defect that is most consistent with a previous infarction. Transient Ischemic Dilatation (Normal <1.22):  n/a Lung/Heart Ratio (Normal <0.45):  0.43  Quantitative Gated Spect Images QGS EDV:  259 ml QGS ESV:  190 ml  Impression Exercise Capacity:  Lexiscan with no exercise. BP Response:  Normal blood pressure response. Clinical Symptoms:  No chest pain or dyspnea. ECG Impression:  No significant ST segment change suggestive of ischemia. Comparison with Prior Nuclear Study: No significant change from previous study 06/03/08.  Overall Impression:  High risk stress nuclear study with large, severe, fixed anterior, septal, inferior and apical infarct; no ischemia..  LV Ejection Fraction: 26%.  LV Wall Motion:  Global hypokinesis.   [name]

## 2012-07-17 ENCOUNTER — Encounter: Payer: Self-pay | Admitting: Internal Medicine

## 2012-08-07 ENCOUNTER — Ambulatory Visit (INDEPENDENT_AMBULATORY_CARE_PROVIDER_SITE_OTHER): Payer: BC Managed Care – PPO | Admitting: *Deleted

## 2012-08-07 DIAGNOSIS — I428 Other cardiomyopathies: Secondary | ICD-10-CM

## 2012-08-07 LAB — ICD DEVICE OBSERVATION
BATTERY VOLTAGE: 2.49 V
DEVICE MODEL ICD: 248599
HV IMPEDENCE: 36 Ohm
RV LEAD IMPEDENCE ICD: 510 Ohm
VENTRICULAR PACING ICD: 0 pct

## 2012-08-09 ENCOUNTER — Encounter: Payer: Self-pay | Admitting: *Deleted

## 2012-08-19 ENCOUNTER — Encounter: Payer: Self-pay | Admitting: Internal Medicine

## 2012-08-26 ENCOUNTER — Other Ambulatory Visit: Payer: Self-pay | Admitting: Cardiology

## 2012-09-11 ENCOUNTER — Encounter: Payer: Self-pay | Admitting: Internal Medicine

## 2012-09-23 ENCOUNTER — Other Ambulatory Visit: Payer: Self-pay | Admitting: Physician Assistant

## 2012-09-23 ENCOUNTER — Encounter: Payer: Self-pay | Admitting: Cardiology

## 2012-09-23 ENCOUNTER — Ambulatory Visit
Admission: RE | Admit: 2012-09-23 | Discharge: 2012-09-23 | Disposition: A | Discharge status: Home / Self Care | Payer: BC Managed Care – PPO | Source: Ambulatory Visit | Attending: Physician Assistant | Admitting: Physician Assistant

## 2012-09-23 DIAGNOSIS — M79604 Pain in right leg: Secondary | ICD-10-CM

## 2012-09-23 DIAGNOSIS — M7989 Other specified soft tissue disorders: Secondary | ICD-10-CM

## 2012-09-25 ENCOUNTER — Inpatient Hospital Stay (HOSPITAL_COMMUNITY)
Admission: EM | Admit: 2012-09-25 | Discharge: 2012-10-01 | DRG: 544 | Disposition: A | Discharge status: Home / Self Care | Payer: BC Managed Care – PPO | Attending: Cardiology | Admitting: Cardiology

## 2012-09-25 ENCOUNTER — Encounter (HOSPITAL_COMMUNITY): Payer: Self-pay | Admitting: *Deleted

## 2012-09-25 ENCOUNTER — Emergency Department (HOSPITAL_COMMUNITY): Payer: BC Managed Care – PPO

## 2012-09-25 DIAGNOSIS — I5023 Acute on chronic systolic (congestive) heart failure: Principal | ICD-10-CM

## 2012-09-25 DIAGNOSIS — J811 Chronic pulmonary edema: Secondary | ICD-10-CM

## 2012-09-25 DIAGNOSIS — I959 Hypotension, unspecified: Secondary | ICD-10-CM

## 2012-09-25 DIAGNOSIS — Z9861 Coronary angioplasty status: Secondary | ICD-10-CM

## 2012-09-25 DIAGNOSIS — I6529 Occlusion and stenosis of unspecified carotid artery: Secondary | ICD-10-CM | POA: Diagnosis present

## 2012-09-25 DIAGNOSIS — I2510 Atherosclerotic heart disease of native coronary artery without angina pectoris: Secondary | ICD-10-CM | POA: Diagnosis present

## 2012-09-25 DIAGNOSIS — I739 Peripheral vascular disease, unspecified: Secondary | ICD-10-CM | POA: Diagnosis present

## 2012-09-25 DIAGNOSIS — I2589 Other forms of chronic ischemic heart disease: Secondary | ICD-10-CM | POA: Diagnosis present

## 2012-09-25 DIAGNOSIS — J069 Acute upper respiratory infection, unspecified: Secondary | ICD-10-CM

## 2012-09-25 DIAGNOSIS — I501 Left ventricular failure: Secondary | ICD-10-CM

## 2012-09-25 DIAGNOSIS — Z95810 Presence of automatic (implantable) cardiac defibrillator: Secondary | ICD-10-CM

## 2012-09-25 DIAGNOSIS — I10 Essential (primary) hypertension: Secondary | ICD-10-CM | POA: Diagnosis present

## 2012-09-25 DIAGNOSIS — E785 Hyperlipidemia, unspecified: Secondary | ICD-10-CM | POA: Diagnosis present

## 2012-09-25 DIAGNOSIS — E119 Type 2 diabetes mellitus without complications: Secondary | ICD-10-CM | POA: Diagnosis present

## 2012-09-25 DIAGNOSIS — I509 Heart failure, unspecified: Secondary | ICD-10-CM | POA: Diagnosis present

## 2012-09-25 DIAGNOSIS — F17200 Nicotine dependence, unspecified, uncomplicated: Secondary | ICD-10-CM | POA: Diagnosis present

## 2012-09-25 DIAGNOSIS — J189 Pneumonia, unspecified organism: Secondary | ICD-10-CM | POA: Diagnosis present

## 2012-09-25 HISTORY — DX: Disorder of arteries and arterioles, unspecified: I77.9

## 2012-09-25 HISTORY — DX: Peripheral vascular disease, unspecified: I73.9

## 2012-09-25 HISTORY — DX: Essential (primary) hypertension: I10

## 2012-09-25 LAB — BASIC METABOLIC PANEL
BUN: 18 mg/dL (ref 6–23)
CO2: 28 mEq/L (ref 19–32)
Chloride: 102 mEq/L (ref 96–112)
Glucose, Bld: 273 mg/dL — ABNORMAL HIGH (ref 70–99)
Potassium: 4.4 mEq/L (ref 3.5–5.1)
Sodium: 139 mEq/L (ref 135–145)

## 2012-09-25 LAB — CBC WITH DIFFERENTIAL/PLATELET
Hemoglobin: 14.2 g/dL (ref 13.0–17.0)
Lymphocytes Relative: 18 % (ref 12–46)
Lymphs Abs: 1.6 10*3/uL (ref 0.7–4.0)
MCHC: 34.2 g/dL (ref 30.0–36.0)
Monocytes Relative: 7 % (ref 3–12)
Neutro Abs: 6.5 10*3/uL (ref 1.7–7.7)
Neutrophils Relative %: 73 % (ref 43–77)
Platelets: 192 10*3/uL (ref 150–400)
RBC: 4.69 MIL/uL (ref 4.22–5.81)
WBC: 8.9 10*3/uL (ref 4.0–10.5)

## 2012-09-25 LAB — POCT I-STAT TROPONIN I

## 2012-09-25 MED ORDER — DEXTROSE 5 % IV SOLN: 1.0000 g | Freq: Once | INTRAVENOUS | Status: DC

## 2012-09-25 MED ORDER — DEXTROSE 5 % IV SOLN
2.0000 g | Freq: Once | INTRAVENOUS | Status: AC
  Administered 2012-09-25: 2 g via INTRAVENOUS
  Filled 2012-09-25: qty 2

## 2012-09-25 MED ORDER — CEFEPIME HCL 1 G IJ SOLR
1.0000 g | Freq: Once | INTRAMUSCULAR | Status: DC
  Filled 2012-09-25: qty 1

## 2012-09-25 MED ORDER — FUROSEMIDE 10 MG/ML IJ SOLN
40.0000 mg | Freq: Once | INTRAMUSCULAR | Status: AC
  Administered 2012-09-25: 40 mg via INTRAVENOUS
  Filled 2012-09-25: qty 4

## 2012-09-25 MED ORDER — VANCOMYCIN HCL 10 G IV SOLR: 1500.0000 mg | Freq: Once | INTRAVENOUS | Status: DC

## 2012-09-25 MED ORDER — VANCOMYCIN HCL 10 G IV SOLR
1500.0000 mg | Freq: Once | INTRAVENOUS | Status: DC
  Administered 2012-09-26: 1500 mg via INTRAVENOUS
  Filled 2012-09-25: qty 1500

## 2012-09-25 NOTE — ED Notes (Addendum)
Critical troponin results shown to Dr.[name]

## 2012-09-25 NOTE — ED Notes (Signed)
Presents with HX of PNA, on antibiotics, feels SOB is worsening, bilateral ankle edema with venous stasis ulcers noted. +3 pitting.

## 2012-09-25 NOTE — ED Provider Notes (Signed)
CSN: [id number]     Arrival date & time 09/25/12  2026 History   First MD Initiated Contact with Patient 09/25/12 2145     Chief Complaint  Patient presents with  . Pneumonia   (Consider location/radiation/quality/duration/timing/severity/associated sxs/prior Treatment) Patient is a 57 y.o. male presenting with pneumonia. The history is provided by the patient. No language interpreter was used.  Pneumonia This is a new problem. The current episode started 1 to 4 weeks ago. Associated symptoms include coughing, nausea and weakness. Pertinent negatives include no fever. Associated symptoms comments: Shortness of breath. He has tried position changes for the symptoms. The treatment provided no relief.   Pt is a 57 year old male who presents with a history of pneumonia 9 days ago. He took his last dose of Levaquin on Sunday. He continues to have a dry, non-productive cough and some shortness of breath. He reports that he is unable to lie down and sleep, he has been sitting upright in a chair and reports that this is his most comfortable position. He went to [facility] yesterday with swelling in his legs and they ordered a doppler study that was negative. He denies fever and chills. He reports nausea and post-tussive vomiting.      Past Medical History  Diagnosis Date  . Hyperlipidemia     mixed  . Chronic systolic heart failure   . ICD (implantable cardiac defibrillator), dual, in situ     St. Jude for severe LVD EF 25% 2/07 explanted 2010. Medtronic Virtuoso II DR Dual-chamber cardiverter-defibrillator  with pocket revision, Dr. Graciela Husbands  . Coronary artery disease     native vessel. S/P AWMI '05 Stent LAD, RCA, CFX. Last Cath 2006 Mod restenosis LAD stent, others patent  . Tobacco abuse   . Ischemic cardiomyopathy   . Cerebrovascular disease   . Diabetes mellitus    Past Surgical History  Procedure Laterality Date  . Medtronic virtuoso ii dr dual-chamber cardioverter-defibrillation  with pocket revison      Dr. Sherryl Manges   Family History  Problem Relation Age of Onset  . Diabetes Mother   . Coronary artery disease Father    History  Substance Use Topics  . Smoking status: Current Every Day Smoker -- 1.00 packs/day for 33 years    Types: Cigarettes  . Smokeless tobacco: Not on file  . Alcohol Use: No    Review of Systems  Constitutional: Negative for fever.  Respiratory: Positive for cough and shortness of breath.   Gastrointestinal: Positive for nausea.  Neurological: Positive for weakness and light-headedness.  All other systems reviewed and are negative.    Allergies  Niacin  Home Medications   Current Outpatient Rx  Name  Route  Sig  Dispense  Refill  . aspirin 325 MG EC tablet   Oral   Take 325 mg by mouth daily.           Marland Kitchen atorvastatin (LIPITOR) 80 MG tablet   Oral   Take 80 mg by mouth daily.         . benazepril (LOTENSIN) 10 MG tablet   Oral   Take 10 mg by mouth daily.         . carvedilol (COREG) 3.125 MG tablet   Oral   Take 3.125 mg by mouth 2 (two) times daily with a meal.         . ezetimibe (ZETIA) 10 MG tablet   Oral   Take 10 mg by mouth daily.         Marland Kitchen  glimepiride (AMARYL) 4 MG tablet   Oral   Take 4 mg by mouth daily before breakfast.         . metFORMIN (GLUCOPHAGE-XR) 500 MG 24 hr tablet   Oral   Take 500 mg by mouth 2 (two) times daily.         . pioglitazone (ACTOS) 30 MG tablet   Oral   Take 30 mg by mouth daily.          BP 144/98  Pulse 123  Temp(Src) 98.4 F (36.9 C) (Oral)  Resp 20  SpO2 97% Physical Exam  Vitals reviewed.   ED Course  Procedures (including critical care time) Labs Review Labs Reviewed - No data to display Imaging Review No results found.   Date: 09/26/2012  Rate: 108  Rhythm: sinus tachycardia  QRS Axis: indeterminate  Intervals: normal  ST/T Wave abnormalities: normal  Conduction Disutrbances:none  Narrative Interpretation: sinus  tachycardia   Old EKG Reviewed: unchanged     11:22 PM B/P 103/64, HR 106, SPO2 94% on room air. Sitting up on stretcher, slight tachypnea, dry cough.   MDM   1. Recurrent pneumonia   2. Pulmonary edema with congestive heart failure     Chest x-ray: unresolved pneumonia, new pleural effusions and pulmonary edema. Tachycardia, tachypnea and dry cough. Pt with bilateral ankle swelling with pitting edema. Troponin 0.24. Lasix, IV maxipime and vanc started in ER. Consulted cardiology and admit to telemetry for further. CHF exacerbation and unresolved pneumonia.         Irish Elders, NP 09/26/12 0020

## 2012-09-25 NOTE — ED Provider Notes (Addendum)
57 year old male, history of congestive heart failure, describes recent history of pneumonia, has had increased coughing which she describes as nonproductive, has also had bilateral increases and swelling of his lower extremities. Reports normal ultrasounds of the lower extremities evaluated for DVT. On my exam he has bilateral rales, mild tachypnea, tachycardia is present. His chest x-ray shows that he has bilateral pulmonary edema but a symmetrical lower lobe infiltrates right greater than left which could be consistent with pneumonia. He has just finished a prescription of Levaquin, I believe that the patient needs admission to the hospital for this increased fluid retention as well as a possible pneumonia that failed outpatient therapy. No leukocytosis, other labs pending, Lasix, antibiotics, admit to the hospital.  Medical screening examination/treatment/procedure(s) were conducted as a shared visit with non-physician practitioner(s) and myself.  I personally evaluated the patient during the encounter.  Clinical Impression: CAP - failed outpt therapy, pulmonary edema  ECG without acute findings to suggest acute MI - trop barely elevated  - likely leaky, lasix and abx given, pt admitted to Dr. Terressa Koyanagi.        Vida Roller, MD 09/25/12 4098  Vida Roller, MD 09/26/12 [phone]

## 2012-09-25 NOTE — ED Notes (Signed)
Pt diagnosed with pneumonia last Monday. Pt states that got an antibiotic and was taking it and told that if it did not get better to come in. Pt took full dose and still is coughing. Pt states that he was suppose to work tonight and could not.

## 2012-09-26 DIAGNOSIS — I10 Essential (primary) hypertension: Secondary | ICD-10-CM

## 2012-09-26 DIAGNOSIS — I2589 Other forms of chronic ischemic heart disease: Secondary | ICD-10-CM

## 2012-09-26 DIAGNOSIS — I5023 Acute on chronic systolic (congestive) heart failure: Secondary | ICD-10-CM

## 2012-09-26 LAB — TSH: TSH: 2.565 u[IU]/mL (ref 0.350–4.500)

## 2012-09-26 LAB — GLUCOSE, CAPILLARY
Glucose-Capillary: 251 mg/dL — ABNORMAL HIGH (ref 70–99)
Glucose-Capillary: 178 mg/dL — ABNORMAL HIGH (ref 70–99)
Glucose-Capillary: 221 mg/dL — ABNORMAL HIGH (ref 70–99)

## 2012-09-26 LAB — MAGNESIUM: Magnesium: 2 mg/dL (ref 1.5–2.5)

## 2012-09-26 LAB — PRO B NATRIURETIC PEPTIDE: Pro B Natriuretic peptide (BNP): 1366 pg/mL — ABNORMAL HIGH (ref 0–125)

## 2012-09-26 LAB — TROPONIN I: Troponin I: <0.3 ng/mL (ref <0.30)

## 2012-09-26 MED ORDER — ASPIRIN EC 325 MG PO TBEC
325.0000 mg | DELAYED_RELEASE_TABLET | Freq: Every day | ORAL | Status: DC
  Administered 2012-09-26 – 2012-10-01 (×6): 325 mg via ORAL
  Filled 2012-09-26 (×6): qty 1

## 2012-09-26 MED ORDER — ONDANSETRON HCL 4 MG/2ML IJ SOLN: 4.0000 mg | Freq: Four times a day (QID) | INTRAMUSCULAR | Status: DC | PRN

## 2012-09-26 MED ORDER — INSULIN ASPART 100 UNIT/ML ~~LOC~~ SOLN
0.0000 [IU] | Freq: Three times a day (TID) | SUBCUTANEOUS | Status: DC
  Administered 2012-09-26: 5 [IU] via SUBCUTANEOUS
  Administered 2012-09-26 – 2012-09-27 (×3): 3 [IU] via SUBCUTANEOUS
  Administered 2012-09-27: 5 [IU] via SUBCUTANEOUS
  Administered 2012-09-27: 2 [IU] via SUBCUTANEOUS
  Administered 2012-09-28: 09:00:00 11 [IU] via SUBCUTANEOUS
  Administered 2012-09-28 (×2): 5 [IU] via SUBCUTANEOUS
  Administered 2012-09-29: 8 [IU] via SUBCUTANEOUS
  Administered 2012-09-29: 07:00:00 3 [IU] via SUBCUTANEOUS
  Administered 2012-09-29: 8 [IU] via SUBCUTANEOUS
  Administered 2012-09-30: 3 [IU] via SUBCUTANEOUS
  Administered 2012-09-30 – 2012-10-01 (×3): 5 [IU] via SUBCUTANEOUS

## 2012-09-26 MED ORDER — CARVEDILOL 3.125 MG PO TABS
3.1250 mg | ORAL_TABLET | Freq: Two times a day (BID) | ORAL | Status: DC
  Administered 2012-09-26 – 2012-09-29 (×8): 3.125 mg via ORAL
  Filled 2012-09-26 (×11): qty 1

## 2012-09-26 MED ORDER — INSULIN ASPART 100 UNIT/ML ~~LOC~~ SOLN
3.0000 [IU] | Freq: Three times a day (TID) | SUBCUTANEOUS | Status: DC
  Administered 2012-09-26 – 2012-10-01 (×16): 3 [IU] via SUBCUTANEOUS

## 2012-09-26 MED ORDER — ENOXAPARIN SODIUM 40 MG/0.4ML ~~LOC~~ SOLN
40.0000 mg | SUBCUTANEOUS | Status: DC
  Filled 2012-09-26: qty 0.4

## 2012-09-26 MED ORDER — BENAZEPRIL HCL 10 MG PO TABS
10.0000 mg | ORAL_TABLET | Freq: Every day | ORAL | Status: DC
  Administered 2012-09-26: 10 mg via ORAL
  Filled 2012-09-26: qty 1

## 2012-09-26 MED ORDER — SODIUM CHLORIDE 0.9 % IJ SOLN
3.0000 mL | Freq: Two times a day (BID) | INTRAMUSCULAR | Status: DC
  Administered 2012-09-26 – 2012-10-01 (×10): 3 mL via INTRAVENOUS

## 2012-09-26 MED ORDER — BENAZEPRIL HCL 5 MG PO TABS
2.5000 mg | ORAL_TABLET | Freq: Every day | ORAL | Status: DC
  Administered 2012-09-27 – 2012-10-01 (×4): 2.5 mg via ORAL
  Filled 2012-09-26 (×5): qty 1

## 2012-09-26 MED ORDER — ENOXAPARIN SODIUM 40 MG/0.4ML ~~LOC~~ SOLN
40.0000 mg | SUBCUTANEOUS | Status: DC
  Administered 2012-09-27 – 2012-09-30 (×4): 40 mg via SUBCUTANEOUS
  Filled 2012-09-26 (×5): qty 0.4

## 2012-09-26 MED ORDER — SODIUM CHLORIDE 0.9 % IJ SOLN
3.0000 mL | INTRAMUSCULAR | Status: DC | PRN
  Administered 2012-09-29: 3 mL via INTRAVENOUS

## 2012-09-26 MED ORDER — INSULIN ASPART 100 UNIT/ML ~~LOC~~ SOLN
0.0000 [IU] | Freq: Every day | SUBCUTANEOUS | Status: DC
  Administered 2012-09-26: 3 [IU] via SUBCUTANEOUS
  Administered 2012-09-27: 22:00:00 2 [IU] via SUBCUTANEOUS

## 2012-09-26 MED ORDER — FUROSEMIDE 10 MG/ML IJ SOLN
40.0000 mg | Freq: Two times a day (BID) | INTRAMUSCULAR | Status: DC
  Administered 2012-09-26 (×2): 40 mg via INTRAVENOUS
  Filled 2012-09-26 (×4): qty 4

## 2012-09-26 MED ORDER — SODIUM CHLORIDE 0.9 % IV SOLN: 250.0000 mL | INTRAVENOUS | Status: DC | PRN

## 2012-09-26 MED ORDER — ATORVASTATIN CALCIUM 80 MG PO TABS
80.0000 mg | ORAL_TABLET | Freq: Every day | ORAL | Status: DC
  Administered 2012-09-26 – 2012-10-01 (×6): 80 mg via ORAL
  Filled 2012-09-26 (×6): qty 1

## 2012-09-26 MED ORDER — ACETAMINOPHEN 325 MG PO TABS
650.0000 mg | ORAL_TABLET | ORAL | Status: DC | PRN
  Administered 2012-09-26: 650 mg via ORAL
  Filled 2012-09-26: qty 2

## 2012-09-26 MED ORDER — INSULIN ASPART 100 UNIT/ML ~~LOC~~ SOLN: 0.0000 [IU] | Freq: Every day | SUBCUTANEOUS | Status: DC

## 2012-09-26 NOTE — Progress Notes (Signed)
Utilization Review Completed Malikah Lakey J. Miley Lindon, RN, BSN, NCM [phone]  

## 2012-09-26 NOTE — Progress Notes (Addendum)
Called by RN re: hypotension SBP 80 now, pt asymptomatic. Pt had benazepril 10 mg, coreg 3.125 mg and Lasix 40 mg IV today.  Will decrease the benazepril to 2.5 mg daily as pt may need more IV Lasix. Continue Coreg at current dose, parameters for both.   Continue to monitor but would prefer to avoid treating the hypotension with IVF or pressors.

## 2012-09-26 NOTE — Progress Notes (Signed)
Nutrition Brief Note  Malnutrition Screening Tool result is inaccurate.  Please consult if nutrition needs are identified.  Taeya Theall RD, LDN Pager #[phone] After Hours pager #[phone]   

## 2012-09-26 NOTE — ED Provider Notes (Signed)
Medical screening examination/treatment/procedure(s) were conducted as a shared visit with non-physician practitioner(s) and myself.  I personally evaluated the patient during the encounter  Please see my separate respective documentation pertaining to this patient encounter   Vida Roller, MD 09/26/12 [phone]

## 2012-09-26 NOTE — Progress Notes (Signed)
Pts. SBP was 77-80's and DBP-50's, manual Bp was taken 78/50, pt. Asymptomatic. Dr. Jon Billings made aware will continue to monitor patient.

## 2012-09-26 NOTE — H&P (Addendum)
Cardiology History and Physical  [name], MD  History of Present Illness (and review of medical records): [name] is a 57 y.o. male who presents for evaluation of shortness of breath.  He is followed by Dr. Jens Som and Dr. Graciela Husbands with known history of coronary artery disease and ischemic cardiomyopathy as well as a prior ICD. The patient has had previous three-vessel stenting. His last Myoview was performed in July 2014 as below. There was no ischemia. His ejection fraction was 26%. Abdominal ultrasound in February 2011 showed no aneurysm. Last carotid Dopplers were performed in Sept 2013. At that time he had 60-79% right and 40-59% left stenosis. F/u recommended in 6 months. Last ABIs were performed in Sept 2013; right was normal and on the left was moderate. Seen by Dr Kirke Corin and medical therapy recommended; also with h/o ICD. I last saw him in August of 2012.   He states that he has been short of breath for the past 10 days.  He saw his PCP two weeks prior and was treated as an outpatient for possible PNA.  He has since not had any improvement and over the past weekend has gotten worse.  He has nonproductive cough, bilateral LE swelling, PND, and orthopnea.  He states he is not always compliant with medications.  He otherwise denies any chest pain, palpitations, presyncope, syncope, or ICD discharge.  He came into ED today because he felt his "PNA" was getting worse.  Nuclear stress test 07/2012: High risk stress nuclear study with large, severe, fixed anterior, septal, inferior and apical infarct; no ischemia..  LV Ejection Fraction: 26%. LV Wall Motion: Global hypokinesis.  Review of Systems Further review of systems was otherwise negative other than stated in HPI.  Patient Active Problem List   Diagnosis Date Noted  . Carotid stenosis 10/25/2011  . Hypertension 08/09/2010  . Automatic implantable cardioverter-defibrillator in situ 08/09/2010  . DIZZINESS 03/23/2009  . CAD  01/21/2009  . AODM 05/15/2008  . TOBACCO ABUSE 05/15/2008  . CARDIOMYOPATHY, ISCHEMIC 05/15/2008  . CEREBROVASCULAR DISEASE 05/15/2008  . PVD 05/15/2008  . CHEST PAIN 05/15/2008  . HYPERLIPIDEMIA-MIXED 01/22/2008  . SYSTOLIC HEART FAILURE, CHRONIC 01/22/2008   Past Medical History  Diagnosis Date  . Hyperlipidemia     mixed  . Chronic systolic heart failure   . ICD (implantable cardiac defibrillator), dual, in situ     [employer] Jude for severe LVD EF 25% 2/07 explanted 2010. Medtronic Virtuoso II DR Dual-chamber cardiverter-defibrillator  with pocket revision, Dr. Graciela Husbands  . Coronary artery disease     native vessel. S/P AWMI '05 Stent LAD, RCA, CFX. Last Cath 2006 Mod restenosis LAD stent, others patent  . Tobacco abuse   . Ischemic cardiomyopathy   . Cerebrovascular disease   . Diabetes mellitus     Past Surgical History  Procedure Laterality Date  . Medtronic virtuoso ii dr dual-chamber cardioverter-defibrillation with pocket revison      Dr. Sherryl Manges     (Not in a hospital admission) Allergies  Allergen Reactions  . Niacin     REACTION: Reaction not known    History  Substance Use Topics  . Smoking status: Current Every Day Smoker -- 1.00 packs/day for 33 years    Types: Cigarettes  . Smokeless tobacco: Not on file  . Alcohol Use: No    Family History  Problem Relation Age of Onset  . Diabetes Mother   . Coronary artery disease Father      Objective:  Patient Vitals for the past 8 hrs:  BP Temp Temp src Pulse Resp SpO2 Height Weight  09/26/12 0038 128/87 mmHg - - 110 22 96 % - -  09/25/12 2330 111/63 mmHg - - 111 - 95 % - -  09/25/12 2300 - - - - - - 6' 0.05" (1.83 m) 95 kg (209 lb 7 oz)  09/25/12 2046 144/98 mmHg 98.4 F (36.9 C) Oral 123 20 97 % - -   General appearance: alert, cooperative, appears stated age and no distress Head: Normocephalic, without obvious abnormality, atraumatic Eyes: conjunctivae/corneas clear. PERRL, EOM's intact. Fundi  benign. Neck: positive HJR Lungs: rales bilaterally Chest wall: no tenderness Heart: regular rate and rhythm, S1, S2 normal, no murmur, click, rub or gallop Abdomen: soft, non-tender, distended,  bowel sounds normal; no masses,  Extremities: chronic venous stasis changes, bilateral LE edema, cool to touch Pulses: 2+ and symmetric Neurologic: Grossly normal  Results for orders placed during the hospital encounter of 09/25/12 (from the past 48 hour(s))  CBC WITH DIFFERENTIAL     Status: None   Collection Time    09/25/12 10:32 PM      Result Value Range   WBC 8.9  4.0 - 10.5 K/uL   RBC 4.69  4.22 - 5.81 MIL/uL   Hemoglobin 14.2  13.0 - 17.0 g/dL   HCT 16.1  09.6 - 04.5 %   MCV 88.5  78.0 - 100.0 fL   MCH 30.3  26.0 - 34.0 pg   MCHC 34.2  30.0 - 36.0 g/dL   RDW 40.9  81.1 - 91.4 %   Platelets 192  150 - 400 K/uL   Neutrophils Relative % 73  43 - 77 %   Neutro Abs 6.5  1.7 - 7.7 K/uL   Lymphocytes Relative 18  12 - 46 %   Lymphs Abs 1.6  0.7 - 4.0 K/uL   Monocytes Relative 7  3 - 12 %   Monocytes Absolute 0.6  0.1 - 1.0 K/uL   Eosinophils Relative 2  0 - 5 %   Eosinophils Absolute 0.1  0.0 - 0.7 K/uL   Basophils Relative 0  0 - 1 %   Basophils Absolute 0.0  0.0 - 0.1 K/uL  BASIC METABOLIC PANEL     Status: Abnormal   Collection Time    09/25/12 10:32 PM      Result Value Range   Sodium 139  135 - 145 mEq/L   Potassium 4.4  3.5 - 5.1 mEq/L   Chloride 102  96 - 112 mEq/L   CO2 28  19 - 32 mEq/L   Glucose, Bld 273 (*) 70 - 99 mg/dL   BUN 18  6 - 23 mg/dL   Creatinine, Ser 7.82  0.50 - 1.35 mg/dL   Calcium 8.9  8.4 - 95.6 mg/dL   GFR calc non Af Amer >90  >90 mL/min   GFR calc Af Amer >90  >90 mL/min   Comment: (NOTE)     The eGFR has been calculated using the CKD EPI equation.     This calculation has not been validated in all clinical situations.     eGFR's persistently <90 mL/min signify possible Chronic Kidney     Disease.  POCT I-STAT TROPONIN I     Status: Abnormal    Collection Time    09/25/12 11:17 PM      Result Value Range   Troponin i, poc 0.24 (*) 0.00 - 0.08 ng/mL   Comment  NOTIFIED PHYSICIAN     Comment 3            Comment: Due to the release kinetics of cTnI,     a negative result within the first hours     of the onset of symptoms does not rule out     myocardial infarction with certainty.     If myocardial infarction is still suspected,     repeat the test at appropriate intervals.   Dg Chest 2 View  09/25/2012   CLINICAL DATA:  Evaluate for pneumonia  EXAM: CHEST  2 VIEW  COMPARISON:  09/16/2012  FINDINGS: No adverse features related to single chamber left approach AICD/pacer.  No cardiomegaly. Convexity along the lower left mediastinum, retrocardiac, the is likely pleural fluid given the appearance a few days prior. There are small bilateral pleural effusions and diffuse interstitial prominence. Patchy perihilar opacities noted, likely clustered in the right upper lobe anteriorly - similar to previous. Remote posterior right 5th, 6th, 7th rib fractures.  IMPRESSION: 1. Pulmonary edema and small pleural effusions have developed since 09/16/2012. 2. Previously seen airspace disease persists, which may pneumonia.   Electronically Signed   By: Tiburcio Pea   On: 09/25/2012 22:52    ECG:  Sinus tachycardia Hr 108, old anterolateral infarct, no significant change from prior ecg.  Assessment: Acute on chronic systolic HF Ischemic CMP EF 26% S/p ICD HTN HLD DM PVD  Plan:  1. Cardiology Admission  2. Continuous monitoring on Telemetry. 3. Repeat ekg on admit, prn chest pain or arrythmia 4. Trend cardiac biomarkers, check BNP 5. Lasix 40mg  IV BID, monitor UOP 6. Strict I/Os, daily weights, 1.5 liter fluid restriction, 2gm Na diet. 7. Continue ASA, BB, ACEi, Statin 8. SSI for DM, hold metformin 9. ICD nearing ERI, has follow up with Dr. Graciela Husbands 10/02/2012 10. Less likely PNA, no fevers, chills, night sweats, or leukocytosis. Will hold  on continuing antibiotics at this time   As above, patient seen and examined; recently treated with levaquin for pneumonia; continues with cough but improved; now with DOE, orthopnea and pedal edema; no chest pain; presentation c/w acute on chronic systolic CHF; continue diuresis and follow renal function. FU troponin negative. [name]

## 2012-09-27 DIAGNOSIS — I5023 Acute on chronic systolic (congestive) heart failure: Principal | ICD-10-CM

## 2012-09-27 DIAGNOSIS — J189 Pneumonia, unspecified organism: Secondary | ICD-10-CM

## 2012-09-27 DIAGNOSIS — I517 Cardiomegaly: Secondary | ICD-10-CM

## 2012-09-27 LAB — BLOOD GAS, ARTERIAL
Bicarbonate: 28.4 mEq/L — ABNORMAL HIGH (ref 20.0–24.0)
TCO2: 29.7 mmol/L (ref 0–100)
pCO2 arterial: 41.2 mmHg (ref 35.0–45.0)
pH, Arterial: 7.453 — ABNORMAL HIGH (ref 7.350–7.450)
pO2, Arterial: 68.6 mmHg — ABNORMAL LOW (ref 80.0–100.0)

## 2012-09-27 LAB — GLUCOSE, CAPILLARY
Glucose-Capillary: 194 mg/dL — ABNORMAL HIGH (ref 70–99)
Glucose-Capillary: 222 mg/dL — ABNORMAL HIGH (ref 70–99)
Glucose-Capillary: 124 mg/dL — ABNORMAL HIGH (ref 70–99)
Glucose-Capillary: 234 mg/dL — ABNORMAL HIGH (ref 70–99)

## 2012-09-27 LAB — BASIC METABOLIC PANEL
BUN: 15 mg/dL (ref 6–23)
Chloride: 98 mEq/L (ref 96–112)
Creatinine, Ser: 0.75 mg/dL (ref 0.50–1.35)
GFR calc Af Amer: >90 mL/min (ref >90)

## 2012-09-27 MED ORDER — AZITHROMYCIN 500 MG PO TABS
500.0000 mg | ORAL_TABLET | Freq: Every day | ORAL | Status: AC
  Administered 2012-09-27: 10:00:00 500 mg via ORAL
  Filled 2012-09-27: qty 1

## 2012-09-27 MED ORDER — PERFLUTREN LIPID MICROSPHERE
1.0000 mL | INTRAVENOUS | Status: AC | PRN
  Filled 2012-09-27: qty 10

## 2012-09-27 MED ORDER — AZITHROMYCIN 250 MG PO TABS
250.0000 mg | ORAL_TABLET | Freq: Every day | ORAL | Status: AC
  Administered 2012-09-28 – 2012-10-01 (×4): 250 mg via ORAL
  Filled 2012-09-27 (×4): qty 1

## 2012-09-27 MED ORDER — FUROSEMIDE 10 MG/ML IJ SOLN
60.0000 mg | Freq: Two times a day (BID) | INTRAMUSCULAR | Status: DC
  Administered 2012-09-27 – 2012-09-29 (×6): 60 mg via INTRAVENOUS
  Filled 2012-09-27 (×8): qty 6

## 2012-09-27 MED ORDER — SPIRONOLACTONE 25 MG PO TABS
25.0000 mg | ORAL_TABLET | Freq: Every day | ORAL | Status: DC
  Administered 2012-09-27 – 2012-09-30 (×4): 25 mg via ORAL
  Filled 2012-09-27 (×5): qty 1

## 2012-09-27 NOTE — Progress Notes (Addendum)
Notified Dalton Demark PA patient c/o breathing is "tight". Breathing is shallow,diminished,and elevated 20/min. She gave me order for ABG which was done and reviewed by her. I will continue to keep patient on oxygen and increase it to 3Lnc per Bjorn Loser and Instruct patient on breathing. I will continue to monitor patient.

## 2012-09-27 NOTE — Progress Notes (Signed)
   Subjective:  Denies CP; Dyspnea mildly improved but persists; mild productive cough.   Objective:  Filed Vitals:   09/26/12 2345 09/27/12 0000 09/27/12 0200 09/27/12 0430  BP: 110/73 111/77 106/82 108/81  Pulse: 87 87 88 86  Temp: 97.4 F (36.3 C)   97.8 F (36.6 C)  TempSrc: Oral   Oral  Resp:      Height:    6' (1.829 m)  Weight:  222 lb 14.2 oz (101.1 kg)  222 lb 14.2 oz (101.1 kg)  SpO2: 97% 99% 97%     Intake/Output from previous day:  Intake/Output Summary (Last 24 hours) at 09/27/12 0758 Last data filed at 09/27/12 0400  Gross per 24 hour  Intake    720 ml  Output   2100 ml  Net  -1380 ml    Physical Exam: Physical exam: Well-developed well-nourished in no acute distress.  Skin is warm and dry.  HEENT is normal.  Neck is supple.  Chest with mildly diminished BS Cardiovascular exam is regular rate and rhythm.  Abdominal exam nontender or distended. No masses palpated. Extremities show 1+ edema. neuro grossly intact    Lab Results: Basic Metabolic Panel:  Recent Labs  81/19/14 2232 09/26/12 0656 09/27/12 0425  NA 139  --  137  K 4.4  --  4.3  CL 102  --  98  CO2 28  --  31  GLUCOSE 273*  --  218*  BUN 18  --  15  CREATININE 0.76  --  0.75  CALCIUM 8.9  --  8.8  MG  --  2.0  --    CBC:  Recent Labs  09/25/12 2232  WBC 8.9  NEUTROABS 6.5  HGB 14.2  HCT 41.5  MCV 88.5  PLT 192   Cardiac Enzymes:  Recent Labs  09/26/12 0115 09/26/12 0656  TROPONINI <0.30 <0.30     Assessment/Plan:  1 acute on chronic systolic congestive heart failure-the patient remains volume overloaded. He continues to complain of dyspnea. Increase Lasix to 60 mg IV twice a day. Add spironolactone 25 mg daily. Follow renal function. Fluid restrict. Low-sodium diet. Repeat echo. 2 probable URI-patient was recently treated with Levaquin. He continues with a mildly productive cough. I will treat with azithromycin. 3 ischemic cardiomyopathy-continue lower dose  ACE inhibitor as blood pressure was borderline yesterday. Continue carvedilol. 4 prior ICD-will need close followup with EP as his device apparently is ERI. 5 hypertension-blood pressure mildly reduced yesterday. Lotensin dose reduced.  [name] 09/27/2012, 7:58 AM

## 2012-09-27 NOTE — Progress Notes (Signed)
Called by RN re: dyspnea.  Pt had all meds this am, still feels breathing is tight. Per her report, no wheeze.  Data reviewed. I/O are negative > 1 liter today so far. He was given Lasix 60 mg IV this am per [facility] order. He has rec'd all am meds ordered except azithromycin.   ABG ordered and reviewed. ABG    Component Value Date/Time   PHART 7.453* 09/27/2012 1210   PCO2ART 41.2 09/27/2012 1210   PO2ART 68.6* 09/27/2012 1210   HCO3 28.4* 09/27/2012 1210   TCO2 29.7 09/27/2012 1210   O2SAT 94.2 09/27/2012 1210   Requested RN increase oxygen to 3 lpm. Continue to follow, will give pm Lasix a little early. If no improvement, consider BiPAP.   Theodore Demark, PA-C 09/27/2012 1:06 PM Beeper [phone]

## 2012-09-27 NOTE — Progress Notes (Signed)
  Echocardiogram 2D Echocardiogram with Definity has been performed by Jeryl Columbia and Sahian Kerney.  [name] [name] 09/27/2012, 6:13 PM

## 2012-09-28 LAB — BASIC METABOLIC PANEL
BUN: 15 mg/dL (ref 6–23)
Chloride: 99 mEq/L (ref 96–112)
Creatinine, Ser: 0.74 mg/dL (ref 0.50–1.35)
GFR calc Af Amer: >90 mL/min (ref >90)
Potassium: 4 mEq/L (ref 3.5–5.1)
Sodium: 138 mEq/L (ref 135–145)

## 2012-09-28 LAB — GLUCOSE, CAPILLARY
Glucose-Capillary: 343 mg/dL — ABNORMAL HIGH (ref 70–99)
Glucose-Capillary: 239 mg/dL — ABNORMAL HIGH (ref 70–99)
Glucose-Capillary: 95 mg/dL (ref 70–99)

## 2012-09-28 NOTE — Progress Notes (Signed)
Pt arrived in room 01 in no distress, Up ambulating on room air, 1-2+ lower extremity edema. Voices no complaints.

## 2012-09-28 NOTE — Progress Notes (Signed)
SUBJECTIVE:  Cough is better.  Less SOB.  Weak   PHYSICAL EXAM Filed Vitals:   09/27/12 2005 09/28/12 0004 09/28/12 0400 09/28/12 0523  BP:   98/58   Pulse: 101  88 95  Temp:  97.5 F (36.4 C)  98.4 F (36.9 C)  TempSrc:  Oral  Oral  Resp:  18    Height:    6' (1.829 m)  Weight:    216 lb 14.9 oz (98.4 kg)  SpO2: 98% 97% 100% 100%   General:  No distress Lungs:  Bilateral Basilar crackles Heart:  RRR Abdomen:  Positive bowel sounds, no rebound no guarding Extremities:  Mild edmea Neck: No JVD  LABS: Lab Results  Component Value Date   TROPONINI <0.30 09/26/2012   Results for orders placed during the hospital encounter of 09/25/12 (from the past 24 hour(s))  GLUCOSE, CAPILLARY     Status: Abnormal   Collection Time    09/27/12  8:10 AM      Result Value Range   Glucose-Capillary 194 (*) 70 - 99 mg/dL  BLOOD GAS, ARTERIAL     Status: Abnormal   Collection Time    09/27/12 12:10 PM      Result Value Range   O2 Content 2.0     Delivery systems NASAL CANNULA     pH, Arterial 7.453 (*) 7.350 - 7.450   pCO2 arterial 41.2  35.0 - 45.0 mmHg   pO2, Arterial 68.6 (*) 80.0 - 100.0 mmHg   Bicarbonate 28.4 (*) 20.0 - 24.0 mEq/L   TCO2 29.7  0 - 100 mmol/L   Acid-Base Excess 4.6 (*) 0.0 - 2.0 mmol/L   O2 Saturation 94.2     Patient temperature 98.6     Collection site LEFT RADIAL     Drawn by [id number]     Sample type ARTERIAL DRAW     Allens test (pass/fail) PASS  PASS  GLUCOSE, CAPILLARY     Status: Abnormal   Collection Time    09/27/12 12:38 PM      Result Value Range   Glucose-Capillary 222 (*) 70 - 99 mg/dL  GLUCOSE, CAPILLARY     Status: Abnormal   Collection Time    09/27/12  5:57 PM      Result Value Range   Glucose-Capillary 124 (*) 70 - 99 mg/dL  GLUCOSE, CAPILLARY     Status: Abnormal   Collection Time    09/27/12 10:24 PM      Result Value Range   Glucose-Capillary 234 (*) 70 - 99 mg/dL   Comment 1 Documented in Chart     Comment 2 Notify RN      BASIC METABOLIC PANEL     Status: Abnormal   Collection Time    09/28/12  5:15 AM      Result Value Range   Sodium 138  135 - 145 mEq/L   Potassium 4.0  3.5 - 5.1 mEq/L   Chloride 99  96 - 112 mEq/L   CO2 32  19 - 32 mEq/L   Glucose, Bld 295 (*) 70 - 99 mg/dL   BUN 15  6 - 23 mg/dL   Creatinine, Ser 0.45  0.50 - 1.35 mg/dL   Calcium 8.6  8.4 - 40.9 mg/dL   GFR calc non Af Amer >90  >90 mL/min   GFR calc Af Amer >90  >90 mL/min    Intake/Output Summary (Last 24 hours) at 09/28/12 0737 Last data filed at 09/28/12 0600  Gross per 24 hour  Intake    920 ml  Output   3225 ml  Net  -2305 ml      ASSESSMENT AND PLAN:  Acute on chronic systolic congestive heart failure-  EF 20% on echo. Good urine output.  Creat is OK.  Still with crackles.  Continue Lasix 60 mg IV twice a day.   Probable URI-patient was recently treated with Levaquin. He continues with a mildly productive cough. Continue azithromycin.   Ischemic cardiomyopathy-continue lower dose ACE inhibitor as blood pressure was borderline yesterday. Continue carvedilol.   ICD-will need close followup with EP as his device apparently is ERI.    Rollene Rotunda 09/28/2012 7:37 AM

## 2012-09-28 NOTE — Progress Notes (Signed)
Pt up ambulating in hallway. Voices no complaints.

## 2012-09-29 LAB — BASIC METABOLIC PANEL
CO2: 35 mEq/L — ABNORMAL HIGH (ref 19–32)
Chloride: 96 mEq/L (ref 96–112)
Creatinine, Ser: 0.86 mg/dL (ref 0.50–1.35)
GFR calc Af Amer: >90 mL/min (ref >90)
Glucose, Bld: 204 mg/dL — ABNORMAL HIGH (ref 70–99)

## 2012-09-29 LAB — GLUCOSE, CAPILLARY
Glucose-Capillary: 273 mg/dL — ABNORMAL HIGH (ref 70–99)
Glucose-Capillary: 295 mg/dL — ABNORMAL HIGH (ref 70–99)
Glucose-Capillary: 190 mg/dL — ABNORMAL HIGH (ref 70–99)

## 2012-09-29 NOTE — Progress Notes (Signed)
SUBJECTIVE:  May be breathing better.  Still with mild cough.    PHYSICAL EXAM Filed Vitals:   09/28/12 1330 09/28/12 2000 09/29/12 0204 09/29/12 0423  BP: 104/73 99/63 106/72 99/69  Pulse: 99 96 91 93  Temp: 98 F (36.7 C) 98.4 F (36.9 C) 98.2 F (36.8 C) 97.9 F (36.6 C)  TempSrc: Oral Oral Oral Oral  Resp: 20 20 20 20   Height:      Weight:    211 lb 13.8 oz (96.1 kg)  SpO2: 99% 97% 97% 93%   General:  No distress Neck: No JVD Lungs:  Bilateral Basilar crackles improved  Heart:  RRR Abdomen:  Positive bowel sounds, no rebound no guarding Extremities:  Mild edema   LABS:  Results for orders placed during the hospital encounter of 09/25/12 (from the past 24 hour(s))  GLUCOSE, CAPILLARY     Status: Abnormal   Collection Time    09/28/12  8:37 AM      Result Value Range   Glucose-Capillary 343 (*) 70 - 99 mg/dL  GLUCOSE, CAPILLARY     Status: Abnormal   Collection Time    09/28/12 11:01 AM      Result Value Range   Glucose-Capillary 213 (*) 70 - 99 mg/dL   Comment 1 Documented in Chart     Comment 2 Notify RN    GLUCOSE, CAPILLARY     Status: Abnormal   Collection Time    09/28/12  4:26 PM      Result Value Range   Glucose-Capillary 239 (*) 70 - 99 mg/dL   Comment 1 Documented in Chart     Comment 2 Notify RN    GLUCOSE, CAPILLARY     Status: None   Collection Time    09/28/12  8:57 PM      Result Value Range   Glucose-Capillary 95  70 - 99 mg/dL   Comment 1 Notify RN    BASIC METABOLIC PANEL     Status: Abnormal   Collection Time    09/29/12  6:45 AM      Result Value Range   Sodium 136  135 - 145 mEq/L   Potassium 5.3 (*) 3.5 - 5.1 mEq/L   Chloride 96  96 - 112 mEq/L   CO2 35 (*) 19 - 32 mEq/L   Glucose, Bld 204 (*) 70 - 99 mg/dL   BUN 15  6 - 23 mg/dL   Creatinine, Ser 6.96  0.50 - 1.35 mg/dL   Calcium 9.4  8.4 - 29.5 mg/dL   GFR calc non Af Amer >90  >90 mL/min   GFR calc Af Amer >90  >90 mL/min  GLUCOSE, CAPILLARY     Status: Abnormal   Collection Time    09/29/12  6:46 AM      Result Value Range   Glucose-Capillary 188 (*) 70 - 99 mg/dL   Comment 1 Documented in Chart     Comment 2 Notify RN      Intake/Output Summary (Last 24 hours) at 09/29/12 0817 Last data filed at 09/29/12 0700  Gross per 24 hour  Intake    240 ml  Output   3520 ml  Net  -3280 ml    ASSESSMENT AND PLAN:  Acute on chronic systolic congestive heart failure-  EF 20% on echo. Good urine output.  Creat is OK.   Continue Lasix 60 mg IV twice a day.  Potassium is up.  If elevated again in the AM discontinue  spironolactone.   Probable URI -   Continue azithromycin.   Ischemic cardiomyopathy-  Continue lower dose ACE inhibitor and carvedilol.   ICD-  Followup with EP as his device is ERI.    [name] 09/29/2012 8:17 AM

## 2012-09-30 LAB — BASIC METABOLIC PANEL
Calcium: 9.2 mg/dL (ref 8.4–10.5)
Creatinine, Ser: 0.8 mg/dL (ref 0.50–1.35)
GFR calc Af Amer: >90 mL/min (ref >90)
GFR calc non Af Amer: >90 mL/min (ref >90)
Glucose, Bld: 202 mg/dL — ABNORMAL HIGH (ref 70–99)
Potassium: 4 mEq/L (ref 3.5–5.1)
Sodium: 135 mEq/L (ref 135–145)

## 2012-09-30 LAB — GLUCOSE, CAPILLARY: Glucose-Capillary: 186 mg/dL — ABNORMAL HIGH (ref 70–99)

## 2012-09-30 MED ORDER — FUROSEMIDE 40 MG PO TABS
40.0000 mg | ORAL_TABLET | Freq: Two times a day (BID) | ORAL | Status: DC
  Administered 2012-09-30 (×2): 40 mg via ORAL
  Filled 2012-09-30 (×5): qty 1

## 2012-09-30 MED ORDER — CARVEDILOL 6.25 MG PO TABS
6.2500 mg | ORAL_TABLET | Freq: Two times a day (BID) | ORAL | Status: DC
  Administered 2012-09-30 (×2): 6.25 mg via ORAL
  Filled 2012-09-30 (×5): qty 1

## 2012-09-30 NOTE — Progress Notes (Signed)
Inpatient Diabetes Program Recommendations  AACE/ADA: New Consensus Statement on Inpatient Glycemic Control (2013)  Target Ranges:  Prepandial:   less than 140 mg/dL      Peak postprandial:   less than 180 mg/dL (1-2 hours)      Critically ill patients:  140 - 180 mg/dL   Results for [name], [name] (MRN [id number]) as of 09/30/2012 17:17  Ref. Range 09/30/2012 05:51 09/30/2012 11:08 09/30/2012 16:10  Glucose-Capillary Latest Range: 70-99 mg/dL 086 (H) 578 (H) 469 (H)   Inpatient Diabetes Program Recommendations Insulin - Meal Coverage: Please increase meal coverage to 6 units [id number]  Thank you, Lenor Coffin, RN, CNS, Diabetes Coordinator [phone])

## 2012-09-30 NOTE — Progress Notes (Signed)
Patient report has been received, patient is sitting up on the side of the bed; patient assessment completed and patient is stable, will continue to monitor patient. Lorretta Harp RN

## 2012-09-30 NOTE — Progress Notes (Addendum)
   Subjective:  Denies CP; Dyspnea mildly improved but persists; mild productive cough.   Objective:  Filed Vitals:   09/29/12 0423 09/29/12 1300 09/29/12 2010 09/30/12 0433  BP: 99/69 98/65 103/71 105/70  Pulse: 93 95 97 90  Temp: 97.9 F (36.6 C) 98.7 F (37.1 C) 97.7 F (36.5 C) 97.7 F (36.5 C)  TempSrc: Oral Oral Oral Oral  Resp: 20 20 20 18   Height:      Weight: 211 lb 13.8 oz (96.1 kg)   208 lb 8.9 oz (94.6 kg)  SpO2: 93% 100% 96% 97%    Intake/Output from previous day:  Intake/Output Summary (Last 24 hours) at 09/30/12 0731 Last data filed at 09/30/12 1914  Gross per 24 hour  Intake    843 ml  Output   4675 ml  Net  -3832 ml    Physical Exam: Physical exam: Well-developed well-nourished in no acute distress.  Skin is warm and dry.  HEENT is normal.  Neck is supple.  Chest with mildly diminished BS Cardiovascular exam is regular rate and rhythm.  Abdominal exam nontender or distended. No masses palpated. Extremities show 1+ edema. neuro grossly intact    Lab Results: Basic Metabolic Panel:  Recent Labs  78/29/56 0645 09/30/12 0340  NA 136 135  K 5.3* 4.0  CL 96 94*  CO2 35* 32  GLUCOSE 204* 202*  BUN 15 16  CREATININE 0.86 0.80  CALCIUM 9.4 9.2   CBC: No results found for this basename: WBC, NEUTROABS, HGB, HCT, MCV, PLT,  in the last 72 hours Cardiac Enzymes: No results found for this basename: CKTOTAL, CKMB, CKMBINDEX, TROPONINI,  in the last 72 hours   Assessment/Plan:  1 acute on chronic systolic congestive heart failure-Symptomatically improved; change lasix to 40 mg po BID; continue spironolactone 25 mg daily. Follow renal function. Fluid restrict. Low-sodium diet. 2 probable URI-patient was recently treated with Levaquin. He continues with a mildly productive cough. Complete azithromycin. 3 ischemic cardiomyopathy-continue lower dose ACE inhibitor and increase carvedilol to 6.25 BID. 4 prior ICD-will need close followup with EP as  his device apparently is ERI. 5 hypertension-follow  [name] 09/30/2012, 7:31 AM

## 2012-10-01 ENCOUNTER — Other Ambulatory Visit: Payer: Self-pay | Admitting: Physician Assistant

## 2012-10-01 ENCOUNTER — Encounter (HOSPITAL_COMMUNITY): Payer: Self-pay | Admitting: Physician Assistant

## 2012-10-01 DIAGNOSIS — J811 Chronic pulmonary edema: Secondary | ICD-10-CM

## 2012-10-01 DIAGNOSIS — I5023 Acute on chronic systolic (congestive) heart failure: Secondary | ICD-10-CM

## 2012-10-01 DIAGNOSIS — J069 Acute upper respiratory infection, unspecified: Secondary | ICD-10-CM

## 2012-10-01 DIAGNOSIS — I959 Hypotension, unspecified: Secondary | ICD-10-CM

## 2012-10-01 LAB — BASIC METABOLIC PANEL
BUN: 17 mg/dL (ref 6–23)
CO2: 27 mEq/L (ref 19–32)
Calcium: 9.1 mg/dL (ref 8.4–10.5)
Chloride: 96 mEq/L (ref 96–112)
Creatinine, Ser: 0.77 mg/dL (ref 0.50–1.35)
Glucose, Bld: 216 mg/dL — ABNORMAL HIGH (ref 70–99)
Potassium: 4.1 mEq/L (ref 3.5–5.1)

## 2012-10-01 LAB — GLUCOSE, CAPILLARY: Glucose-Capillary: 221 mg/dL — ABNORMAL HIGH (ref 70–99)

## 2012-10-01 MED ORDER — FUROSEMIDE 40 MG PO TABS: 40.0000 mg | ORAL_TABLET | Freq: Every day | ORAL | Status: DC

## 2012-10-01 MED ORDER — FUROSEMIDE 40 MG PO TABS
40.0000 mg | ORAL_TABLET | Freq: Every day | ORAL | Status: DC
  Administered 2012-10-01: 10:00:00 40 mg via ORAL
  Filled 2012-10-01: qty 1

## 2012-10-01 MED ORDER — CARVEDILOL 6.25 MG PO TABS: 6.2500 mg | ORAL_TABLET | Freq: Two times a day (BID) | ORAL | Status: DC

## 2012-10-01 MED ORDER — BENAZEPRIL HCL 5 MG PO TABS: 2.5000 mg | ORAL_TABLET | Freq: Every day | ORAL | Status: DC

## 2012-10-01 NOTE — Discharge Summary (Signed)
Discharge Summary   Patient ID: [name] MRN: [id number], DOB/AGE: 58-30-1957 58 y.o. Admit date: 09/25/2012 D/C date:     10/01/2012  Primary Cardiologist: Crenshaw/EP-Klein  Primary Discharge Diagnoses:  1. Acute on chronic systolic congestive heart failure with pulmonary edema - EF 20% by echo this admission 2. Ischemic cardiomyopathy s/p prior ICD at [facility] - pt has f/u with Dr Graciela Husbands 10/07/12 3. Upper respiratory infection - completed course of azithromycin in hospital 4. HTN with intermittent hypotension this admission 5. Diabetes mellitus  Secondary Discharge Diagnoses:  1. Carotid stenosis - 60-79% RICA, 40-59% LICA by duplex 09/2011. 2. CAD - history includes AWMI requiring IABP 2005 s/p Horizon study stent-LAD, staged BMS-Cx, DESx2-RCA; Last cath 2006: mod restenosis LAD stent, mod dz in large OM unchanged, others patent. 3. Cerebrovascular disease 4. Tobacco abuse 5. Hyperlipidemia 6. PVD - ABI 09/2011 - R normal, L moderate - saw Dr. Kirke Corin - med rx.   Hospital Course: [name] is a 57 y/o M with history of CAD s/p prior stenting, ICM/chronic systolic CHF s/p ICD, carotid artery disease, PVD, and DM who presented to [facility] 09/25/2012 with SOB. Last myoview was in 07/2012 with large, severe, fixed anterior, septal, inferior and apical infarct; no ischemia, EF 26%. He presented with SOB for the past 10 days. He had seen his PCP 2 weeks prior and was treated as outpatient for possible pneumonia with Levaquin. He had not seen any improvement and in the days preceding admission, had felt worse. He endorsed nonproductive cough, bilateral LE swelling, PND, and orthopnea. He stated he was not always compliant with medications. He otherwise denied any chest pain, palpitations, presyncope, syncope, or ICD discharge. Initial POC troponin was mildly elevated at 0.24 but subsequent sets were negative, ECG showed sinus tach 108, old anterolateral infarct, no significant change from  prior ECG. CXR showed interval development of pulmonary edema and small pleural effusions from 9/15, and previously seen airspace possibly pneumonia. He was admitted to the hospital and placed on Lasix 40mg  IV BID for a/c systolic CHF. With diuresis he developed some hypotension thus his other antihypertensives were adjusted to accommodate this. In order to further facilitate diuresis, his Lasix was pushed up to 60mg  IV BID. Azithromycin was prescribed for probable URI. Discharge weight is 209lb (weight trend is unclear - 209 lb was listed as admit weight, but he was as high as 218-222 the following two days.) He diuresed a net -11.8L. Today he feels much better. Spironolactone was initially added this admission but the patient's blood pressure remained on the hypotensive side. Thus this was discontinued on last day of hospitalization per discussion with MD. The patient also had a K of 5.3 (no mention of hemolysis during this admission which did stabilize to a discharge K of 4.1, thus will hold off on prescribing KCl at discharge. He will have a BMET in 1 week. Dr. Jens Som has seen and examined the patient today and feels he is stable for discharge.  Today the patient has completed his 5 day course of azithromycin prior to discharge. Actos was also discontinued this admission due to implications in CHF. He will be continued on Metformin and Amaryl and was instructed to call PCP to discuss his medication regimen. I have submitted the patient's information to our scheduling department for their follow-up appointment (TCM pt, BMET 1 week), and our office will call them with this information. He already has followup on 10/6 with Dr. Graciela Husbands to discuss device  being at [facility].  Discharge Vitals: Blood pressure 88/60, pulse 92, temperature 97.8 F (36.6 C), temperature source Oral, resp. rate 20, height 6' (1.829 m), weight 209 lb 1.6 oz (94.847 kg), SpO2 98.00%.  Labs: Lab Results  Component Value Date   WBC 8.9  09/25/2012   HGB 14.2 09/25/2012   HCT 41.5 09/25/2012   MCV 88.5 09/25/2012   PLT 192 09/25/2012     Recent Labs Lab 10/01/12 0510  NA 135  K 4.1  CL 96  CO2 27  BUN 17  CREATININE 0.77  CALCIUM 9.1  GLUCOSE 216*   Results for PIUS, BYROM (MRN [id number]) as of 10/01/2012 08:33  Ref. Range 09/25/2012 23:17 09/26/2012 01:13 09/26/2012 01:15 09/26/2012 06:56  Troponin I Latest Range: <0.30 ng/mL   <0.30 <0.30  Troponin i, poc Latest Range: 0.00-0.08 ng/mL 0.24 [facility])       Diagnostic Studies/Procedures   2D Echo 09/27/12 - Left ventricle: The cavity size was mildly dilated. Wall thickness was normal. The estimated ejection fraction was 20%. Diffuse hypokinesis with peri-apical akinesis. Definity contrast was used. No LV thrombus noted. Doppler parameters are consistent with restrictive physiology, indicative of decreased left ventricular diastolic compliance and/or increased left atrial pressure. - Aortic valve: There was no stenosis. - Mitral valve: Trivial regurgitation. - Left atrium: The atrium was mildly dilated. - Right ventricle: Poorly visualized. The cavity size was normal. Systolic function was mildly reduced. - Tricuspid valve: Peak RV-RA gradient: 29mm Hg (S). - Pulmonary arteries: PA peak pressure: 39mm Hg (S). - Systemic veins: IVC measured 2.4 cm with some respirophasic variation, suggesing RA pressure 10 mmHg. Impressions: - Mildly dilated LV with EF 20%. Diffuse hypokinesis with peri-apical akinesis. No LV thrombus noted. Restrictive diastolic function. Normal RV size with mildly decreased systolic function (RV poorly visualized).  Dg Chest 2 View 09/25/2012   CLINICAL DATA:  Evaluate for pneumonia  EXAM: CHEST  2 VIEW  COMPARISON:  09/16/2012  FINDINGS: No adverse features related to single chamber left approach AICD/pacer.  No cardiomegaly. Convexity along the lower left mediastinum, retrocardiac, the is likely pleural fluid given the appearance a few days  prior. There are small bilateral pleural effusions and diffuse interstitial prominence. Patchy perihilar opacities noted, likely clustered in the right upper lobe anteriorly - similar to previous. Remote posterior right 5th, 6th, 7th rib fractures.  IMPRESSION: 1. Pulmonary edema and small pleural effusions have developed since 09/16/2012. 2. Previously seen airspace disease persists, which may pneumonia.   Electronically Signed   By: Tiburcio Pea   On: 09/25/2012 22:52   Discharge Medications     Medication List    STOP taking these medications       pioglitazone 30 MG tablet  Commonly known as:  ACTOS      TAKE these medications       aspirin 325 MG EC tablet  Take 325 mg by mouth daily.     atorvastatin 80 MG tablet  Commonly known as:  LIPITOR  Take 80 mg by mouth daily.     benazepril 5 MG tablet  Commonly known as:  LOTENSIN  Take 0.5 tablets (2.5 mg total) by mouth daily.     carvedilol 6.25 MG tablet  Commonly known as:  COREG  Take 1 tablet (6.25 mg total) by mouth 2 (two) times daily with a meal.     ezetimibe 10 MG tablet  Commonly known as:  ZETIA  Take 10 mg by mouth daily.  furosemide 40 MG tablet  Commonly known as:  LASIX  Take 1 tablet (40 mg total) by mouth daily. May take an additional 1 tablet daily as needed for weight gain of 2-3 lbs overnight.     glimepiride 4 MG tablet  Commonly known as:  AMARYL  Take 4 mg by mouth daily before breakfast.     metFORMIN 500 MG 24 hr tablet  Commonly known as:  GLUCOPHAGE-XR  Take 500 mg by mouth 2 (two) times daily.        Disposition   The patient will be discharged in stable condition to home. Discharge Orders   Future Appointments Provider Department Dept Phone   10/07/2012 10:30 AM [name], MD [facility] [phone]   Future Orders Complete By Expires   Diet - low sodium heart healthy  As directed    Scheduling Instructions:     Diabetic Diet   Discharge  instructions  As directed    Comments:     Please monitor your blood pressure occasionally at home. Call your doctor if you tend to get readings of less than 95 on the top number.  Your Actos was discontinued as this can sometimes exacerbate congestive heart failure. Metformin should also be used with caution. Please call your primary care doctor today to discuss this adjustment to your medicine regimen.   For patients with congestive heart failure, we give them these special instructions:  1. Follow a low-salt diet and watch your fluid intake. In general, you should not be taking in more than 2 liters of fluid per day (no more than 8 glasses per day). Some patients are restricted to less than 1.5 liters of fluid per day (no more than 6 glasses per day). This includes sources of water in foods like soup, coffee, tea, milk, etc. 2. Weigh yourself on the same scale at same time of day and keep a log. 3. Call your doctor: (Anytime you feel any of the following symptoms)  - 3-4 pound weight gain in 1-2 days or 2 pounds overnight  - Shortness of breath, with or without a dry hacking cough  - Swelling in the hands, feet or stomach  - If you have to sleep on extra pillows at night in order to breathe  IT IS IMPORTANT TO LET YOUR DOCTOR KNOW EARLY ON IF YOU ARE HAVING SYMPTOMS SO WE CAN HELP YOU!   Increase activity slowly  As directed      Follow-up Information   Follow up with [name], MD. (Our office will call you for a follow-up appointment. Please call the office if you have not heard from [place] within 3 days. )    Specialty:  Cardiology   Contact information:   [address] [place] [address] [phone]       Follow up with Sherryl Manges, MD. (10/07/12 at 10:30am)    Specialty:  Cardiology   Contact information:   [address] [place] [address] [phone]         Duration of Discharge Encounter: Greater than 30 minutes including  physician and PA time.  Signed, Ronie Spies PA-C 10/01/2012, 8:36 AM

## 2012-10-01 NOTE — Progress Notes (Signed)
Utilization Review Completed.   Azula Zappia, RN, BSN Nurse Case Manager  [phone]  

## 2012-10-01 NOTE — Discharge Summary (Signed)
See progress notes Brian Crenshaw  

## 2012-10-01 NOTE — Progress Notes (Signed)
   Subjective:  Denies CP; Dyspnea resolved this AM.  Objective:  Filed Vitals:   09/30/12 1731 09/30/12 2115 09/30/12 2207 10/01/12 0538  BP: 99/63 85/65 106/74 103/66  Pulse: 88 89 91 91  Temp: 97.8 F (36.6 C) 98.1 F (36.7 C)  97.8 F (36.6 C)  TempSrc: Oral Oral  Oral  Resp:  18  18  Height:      Weight:    209 lb 1.6 oz (94.847 kg)  SpO2: 100% 96%  97%    Intake/Output from previous day:  Intake/Output Summary (Last 24 hours) at 10/01/12 8119 Last data filed at 10/01/12 0600  Gross per 24 hour  Intake   1083 ml  Output   2125 ml  Net  -1042 ml    Physical Exam: Physical exam: Well-developed well-nourished in no acute distress.  Skin is warm and dry.  HEENT is normal.  Neck is supple.  Chest with mildly diminished BS Cardiovascular exam is regular rate and rhythm.  Abdominal exam nontender or distended. No masses palpated. Extremities show no edema. neuro grossly intact    Lab Results: Basic Metabolic Panel:  Recent Labs  14/78/29 0340 10/01/12 0510  NA 135 135  K 4.0 4.1  CL 94* 96  CO2 32 27  GLUCOSE 202* 216*  BUN 16 17  CREATININE 0.80 0.77  CALCIUM 9.2 9.1     Assessment/Plan:  1 acute on chronic systolic congestive heart failure-Symptomatically improved; change lasix to 40 mg po daily; continue spironolactone 25 mg daily. Take additional 40 mg lasix daily PRN weight gain of 2-3 LBs. Plan BMET one week after DC. Fluid restrict. Low-sodium diet. 2 probable URI-Complete azithromycin. 3 ischemic cardiomyopathy-continue ACEI and coreg at present dose. 4 prior ICD-will need close followup with EP as his device apparently is ERI. 5 hypertension-follow DC today and fu with me in 2-4 weeks >30 min PA and physician time D2 [name] 10/01/2012, 7:27 AM

## 2012-10-02 ENCOUNTER — Encounter: Payer: BC Managed Care – PPO | Admitting: Internal Medicine

## 2012-10-07 ENCOUNTER — Other Ambulatory Visit (INDEPENDENT_AMBULATORY_CARE_PROVIDER_SITE_OTHER): Payer: BC Managed Care – PPO

## 2012-10-07 ENCOUNTER — Ambulatory Visit (INDEPENDENT_AMBULATORY_CARE_PROVIDER_SITE_OTHER): Payer: BC Managed Care – PPO | Admitting: Internal Medicine

## 2012-10-07 ENCOUNTER — Encounter: Payer: Self-pay | Admitting: Internal Medicine

## 2012-10-07 VITALS — BP 105/75 | HR 109 | Ht 72.0 in | Wt 207.0 lb

## 2012-10-07 DIAGNOSIS — F17200 Nicotine dependence, unspecified, uncomplicated: Secondary | ICD-10-CM

## 2012-10-07 DIAGNOSIS — I5022 Chronic systolic (congestive) heart failure: Secondary | ICD-10-CM

## 2012-10-07 DIAGNOSIS — Z95810 Presence of automatic (implantable) cardiac defibrillator: Secondary | ICD-10-CM

## 2012-10-07 DIAGNOSIS — I2589 Other forms of chronic ischemic heart disease: Secondary | ICD-10-CM

## 2012-10-07 DIAGNOSIS — I5023 Acute on chronic systolic (congestive) heart failure: Secondary | ICD-10-CM

## 2012-10-07 LAB — ICD DEVICE OBSERVATION
BATTERY VOLTAGE: 2.46 V
DEVICE MODEL ICD: 248599
FVT: 0
HV IMPEDENCE: 36 Ohm
PACEART VT: 0
TZAT-0001FASTVT: 1
TZAT-0013FASTVT: 1
TZAT-0013SLOWVT: 3
TZAT-0018SLOWVT: NEGATIVE
TZON-0003FASTVT: 284.3 ms
TZON-0004FASTVT: 24
TZON-0004SLOWVT: 35
TZST-0001FASTVT: 3
TZST-0001FASTVT: 5
TZST-0001SLOWVT: 3
TZST-0001SLOWVT: 5
TZST-0003FASTVT: 36 J
TZST-0003FASTVT: 36 J
TZST-0003SLOWVT: 36 J
VENTRICULAR PACING ICD: 0 pct

## 2012-10-07 LAB — BASIC METABOLIC PANEL
CO2: 28 mEq/L (ref 19–32)
Chloride: 99 mEq/L (ref 96–112)
Creatinine, Ser: 0.8 mg/dL (ref 0.4–1.5)
Potassium: 4.8 mEq/L (ref 3.5–5.1)
Sodium: 135 mEq/L (ref 135–145)

## 2012-10-07 MED ORDER — CARVEDILOL 6.25 MG PO TABS: 9.3750 mg | ORAL_TABLET | Freq: Two times a day (BID) | ORAL | Status: DC

## 2012-10-07 MED ORDER — ASPIRIN 81 MG PO TABS: 81.0000 mg | ORAL_TABLET | Freq: Every day | ORAL | Status: DC

## 2012-10-07 NOTE — Patient Instructions (Addendum)
Your physician has recommended you make the following change in your medication:  1) Decrease Aspirin to 81 mg daily 2) Increase Carvedilol to 9.375 mg twice daily  Stop smoking  Your physician recommends that you schedule a follow-up appointment in: End of November with Rick Duff [facility]  Device check on 11/18/13 10:30 am

## 2012-10-07 NOTE — Assessment & Plan Note (Signed)
The patient is approaching ERI. We'll see at the end of November.

## 2012-10-07 NOTE — Progress Notes (Signed)
Patient Care Team: Sigmund Hazel, MD as PCP - General (Family Medicine)   HPI  [name] is a 57 y.o. male Seen in followup for ischemic heart disease with prior stenting of LAD/RCA and LCx with last Cath 2006. He has a previously implanted ICD that has  Been approaching ERI  .  He has known peripheral vascular disease with ABIs in remote past and carotid Dopplers in our office a year ago with 60-79% bilaterally.   He was recently hosp for chf  EF 25 %;Last myoview was in 07/2012 with large, severe, fixed anterior, septal, inferior and apical infarct; no ischemia, EF 26%  Aslo Rx with Abx he is muych better  dauyghter got married last weekend  Up titration of ACE inhibitors and beta blockers in the past has been limited by dizziness      Past Medical History  Diagnosis Date  . Hyperlipidemia     mixed  . Chronic systolic heart failure   . ICD (implantable cardiac defibrillator), dual, in situ     St. Jude for severe LVD EF 25% 2/07 explanted 2010. Medtronic Virtuoso II DR Dual-chamber cardiverter-defibrillator  with pocket revision, Dr. Graciela Husbands  . Coronary artery disease     a. AWMI requiring IABP 2005 s/p Horizon study stent-LAD, staged BMS-Cx, DESx2-RCA. b. Last cath 2006: mod restenosis LAD stent, mod dz in large OM unchanged, others patent.  . Tobacco abuse   . Ischemic cardiomyopathy   . Cerebrovascular disease   . Diabetes mellitus   . Carotid artery disease     a. Dopp 09/2011: 60-79% RICA, 40-59% LICA.  Marland Kitchen HTN (hypertension)   . PVD (peripheral vascular disease)     a. ABI 09/2011 - R normal, L moderate - saw Dr. Kirke Corin - med rx.     Past Surgical History  Procedure Laterality Date  . Medtronic virtuoso ii dr dual-chamber cardioverter-defibrillation with pocket revison      Dr. Sherryl Manges    Current Outpatient Prescriptions  Medication Sig Dispense Refill  . aspirin 325 MG EC tablet Take 325 mg by mouth daily.        Marland Kitchen atorvastatin (LIPITOR) 80 MG tablet Take 80 mg  by mouth daily.      . benazepril (LOTENSIN) 5 MG tablet Take 0.5 tablets (2.5 mg total) by mouth daily.  30 tablet  3  . carvedilol (COREG) 6.25 MG tablet Take 1 tablet (6.25 mg total) by mouth 2 (two) times daily with a meal.  60 tablet  3  . ezetimibe (ZETIA) 10 MG tablet Take 10 mg by mouth daily.      . furosemide (LASIX) 40 MG tablet Take 1 tablet (40 mg total) by mouth daily. May take an additional 1 tablet daily as needed for weight gain of 2-3 lbs overnight.  60 tablet  3  . glimepiride (AMARYL) 4 MG tablet Take 4 mg by mouth daily before breakfast.      . metFORMIN (GLUCOPHAGE-XR) 500 MG 24 hr tablet Take 500 mg by mouth 2 (two) times daily.       No current facility-administered medications for this visit.    Allergies  Allergen Reactions  . Niacin     REACTION: Reaction not known    Review of Systems negative except from HPI and PMH  Physical Exam BP 105/75  Pulse 109  Ht 6' (1.829 m)  Wt 207 lb (93.895 kg)  BMI 28.07 kg/m2 Well developed and nourished in no acute distress HENT normal Neck  supple with JVP-flat Clear Regular rate and rhythm,+s4 with 2/6 murmu Abd-soft with active BS No Clubbing cyanosis edema Skin-warm and dry A & Oriented  Grossly normal sensory and motor function     Assessment and  Plan

## 2012-10-07 NOTE — Assessment & Plan Note (Signed)
As above.

## 2012-10-07 NOTE — Assessment & Plan Note (Signed)
Still smoking after all the years

## 2012-10-07 NOTE — Assessment & Plan Note (Signed)
He is euvolemic. We will continue his current diuretics. We'll decrease his aspirin from 325--81 we'll increase his carvedilol to 0.25 twice a day to 6.25/9.375. We'll see him in the end of November and up titration if possible

## 2012-10-09 ENCOUNTER — Encounter: Payer: Self-pay | Admitting: Internal Medicine

## 2012-10-15 ENCOUNTER — Ambulatory Visit (INDEPENDENT_AMBULATORY_CARE_PROVIDER_SITE_OTHER): Payer: BC Managed Care – PPO | Admitting: Physician Assistant

## 2012-10-15 ENCOUNTER — Encounter: Payer: Self-pay | Admitting: Physician Assistant

## 2012-10-15 VITALS — BP 104/72 | HR 111 | Ht 72.0 in | Wt 210.0 lb

## 2012-10-15 DIAGNOSIS — I10 Essential (primary) hypertension: Secondary | ICD-10-CM

## 2012-10-15 DIAGNOSIS — E785 Hyperlipidemia, unspecified: Secondary | ICD-10-CM

## 2012-10-15 DIAGNOSIS — F17200 Nicotine dependence, unspecified, uncomplicated: Secondary | ICD-10-CM

## 2012-10-15 DIAGNOSIS — I2589 Other forms of chronic ischemic heart disease: Secondary | ICD-10-CM

## 2012-10-15 DIAGNOSIS — I679 Cerebrovascular disease, unspecified: Secondary | ICD-10-CM

## 2012-10-15 DIAGNOSIS — Z95810 Presence of automatic (implantable) cardiac defibrillator: Secondary | ICD-10-CM

## 2012-10-15 DIAGNOSIS — I2510 Atherosclerotic heart disease of native coronary artery without angina pectoris: Secondary | ICD-10-CM

## 2012-10-15 DIAGNOSIS — I5022 Chronic systolic (congestive) heart failure: Secondary | ICD-10-CM

## 2012-10-15 NOTE — Patient Instructions (Signed)
NO CHANGES WERE MADE TODAY WITH YOUR MEDICATIONS  PLEASE FOLLOW UP WITH DR. CRENSHAW IN 6-8 WEEKS

## 2012-10-15 NOTE — Progress Notes (Signed)
[address] [place], [place]  [address] Phone: [phone] Fax:  [phone]  Date:  10/15/2012   ID:  [name], DOB [birth year], MRN [id number]  PCP:  [name], MD  Cardiologist:  Dr. [name]   Electrophysiologist:  Dr. Sherryl Manges    History of Present Illness: [name] is a 57 y.o. male who returns for follow up after a recent admission to the hospital.   He has a hx of CAD, s/p ant MI in 2005 req IABP tx with Horizon study stent to the LAD followed by staged BMS to the CFX and DES x 2 to RCA, Ischemic CM, systolic CHF, s/p AICD, HTN, DM2, HL, carotid stenosis, PAD.  Last LHC (6/06):  EF 25%, pLAD stent 40-50, after stent 40, dLAD 20, pCFX 20, pOM1 70, pRCA 20, mRCA stents ok.  Abdominal US (2/11):  No AAA.  Myoview (7/14):  EF 26%, large ant, septal, inf and apical infarct, no ischemia.  Med Rx continued.  Carotid US (7/14):  Bilateral 60-79% => f/u 6 mos.  ABIs (9/13): normal on R and mod on L => seen by Dr. Kirke Corin and med Rx recommended.   Admitted 9/24-9/30 after presenting with worsening dyspnea.  He had previously been treated with antibiotics for possible community-acquired pneumonia as an outpatient. Initial POC troponin was mildly elevated. Subsequent cardiac markers were negative. Chest x-ray was consistent with pulmonary edema. He was treated with IV Lasix. Echo (09/27/12): EF 20%, diffuse HK with periapical AK, no LV thrombus noted, restrictive physiology, trivial MR, mild LAE, RVSF mildly reduced, PASP 39.  Medication adjustments were somewhat limited by hypotension. He was also placed on azithromycin. He diuresed 11.8 L. Patient could not tolerate spironolactone due to low blood pressure (potassium was also elevated). Actos was discontinued. He was asked to followup with his PCP for further management of his diabetes.  He has been seen in followup by Dr. Graciela Husbands since discharge. He is being brought back for close followup as his device is  approaching ERI. Of note, carvedilol was adjusted.  He is doing well since d/c.  He has occasional chest pains that are atypical.  This is improving.  He denies exertional chest pain.  Breathing is better.  He describes NYHA Class II symptoms.  No orthopnea, PND.  LE edema is improved.  R leg > L leg (this is typical for him).  He denies any weight changes at home.  He has not taken any medications yet today.  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  He is still smoking.   Labs (9/14):   K 4.1, creatinine 0.77, BNP 1366, Hgb 14.2, TSH 2.565,  Labs (10/14): K 4.8, creatinine 0.8  Wt Readings from Last 3 Encounters:  10/15/12 210 lb (95.255 kg)  10/07/12 207 lb (93.895 kg)  10/01/12 209 lb 1.6 oz (94.847 kg)     Past Medical History  Diagnosis Date  . Hyperlipidemia     mixed  . Chronic systolic heart failure   . ICD (implantable cardiac defibrillator), dual, in situ     St. Jude for severe LVD EF 25% 2/07 explanted 2010. Medtronic Virtuoso II DR Dual-chamber cardiverter-defibrillator  with pocket revision, Dr. Graciela Husbands  . Tobacco abuse   . Ischemic cardiomyopathy     a. Echo (09/27/12): EF 20%, diffuse HK with periapical AK, no LV thrombus noted, restrictive physiology, trivial MR, mild LAE, RVSF mildly reduced, PASP 39  . Cerebrovascular disease   .  Diabetes mellitus   . HTN (hypertension)   . PVD (peripheral vascular disease)     a. ABI 09/2011 - R normal, L moderate - saw Dr. Kirke Corin - med rx.   . Carotid artery disease     a. Dopp 09/2011: 60-79% RICA, 40-59% LICA.;  b.  Carotid US (7/14):  Bilateral 60-79% => f/u 6 mos  . Coronary artery disease     a. AWMI requiring IABP 2005 s/p Horizon study stent-LAD, staged BMS-Cx, DESx2-RCA. b. Last Last LHC (6/06):  EF 25%, pLAD stent 40-50, after stent 40, dLAD 20, pCFX 20, pOM1 70, pRCA 20, mRCA stents ok.=> med Rx;  c.  Myoview (7/14):  EF 26%, large ant, septal, inf and apical infarct, no ischemia.  Med Rx continued    Current Outpatient  Prescriptions  Medication Sig Dispense Refill  . aspirin 81 MG tablet Take 1 tablet (81 mg total) by mouth daily.  30 tablet  3  . atorvastatin (LIPITOR) 80 MG tablet Take 80 mg by mouth daily.      . benazepril (LOTENSIN) 5 MG tablet Take 0.5 tablets (2.5 mg total) by mouth daily.  30 tablet  3  . carvedilol (COREG) 6.25 MG tablet Take 1.5 tablets (9.375 mg total) by mouth 2 (two) times daily with a meal.  60 tablet  0  . ezetimibe (ZETIA) 10 MG tablet Take 10 mg by mouth daily.      . furosemide (LASIX) 40 MG tablet Take 1 tablet (40 mg total) by mouth daily. May take an additional 1 tablet daily as needed for weight gain of 2-3 lbs overnight.  60 tablet  3  . glimepiride (AMARYL) 4 MG tablet Take 4 mg by mouth daily before breakfast.      . metFORMIN (GLUCOPHAGE-XR) 500 MG 24 hr tablet Take 500 mg by mouth 2 (two) times daily.       No current facility-administered medications for this visit.    Allergies:    Allergies  Allergen Reactions  . Niacin     REACTION: Reaction not known    Social History:  The patient  reports that he has been smoking Cigarettes.  He has a 33 pack-year smoking history. He does not have any smokeless tobacco history on file. He reports that he does not drink alcohol or use illicit drugs.   Family History:  The patient's family history includes Coronary artery disease in his father; Diabetes in his mother.   ROS:  Please see the history of present illness.   Cough is improved.  No fevers.   All other systems reviewed and negative.   PHYSICAL EXAM: VS:  BP 104/72  Pulse 111  Ht 6' (1.829 m)  Wt 210 lb (95.255 kg)  BMI 28.47 kg/m2 Well nourished, well developed, in no acute distress HEENT: normal Neck: no JVD Cardiac:  normal S1, S2; RRR; no murmur Lungs:  clear to auscultation bilaterally, no wheezing, rhonchi or rales Abd: soft, nontender, no hepatomegaly Ext: trace brawny bilateral LE edema Skin: warm and dry Neuro:  CNs 2-12 intact, no focal  abnormalities noted  EKG:  Sinus tachy, HR 111, inf and AL Q waves, no changes  ASSESSMENT AND PLAN:  1. Chronic Systolic CHF:  Volume stable.  Continue daily weights.  He knows to take extra Lasix if weight increases.  He had f/u labs last week that demonstrated stable K+ and creatinine.   2. Ischemic CM:  Continue current dose of beta blocker and ACEI.  BP  will not tolerate further titration.  BP could not tol Spironolactone in the hospital.  With his fast HR, could consider digoxin.  He is NYHA Class II at this point.  Elevated HR may be in response to recent illness. No meds yet today.  Beta blocker just adjusted by Dr. Graciela Husbands.  No medication changes today.  Consider digoxin in follow up. 3. CAD:  No angina.  Continue ASA, statin. 4. Hypertension:  Controlled.  5. Hyperlipidemia:  Continue statin. 6. Diabetes Mellitus:  F/u with PCP for management now that he is off Actos.  7. Carotid Stenosis:  F/u Carotid US due in 01/2013. 8. S/p AICD:  F/u with EP as planned as device approaching ERI. 9. Tobacco Abuse:  He knows he needs to quit.  10. Disposition:  F/u with Dr. [name] in 6-8 weeks.   Signed, Tereso Newcomer, PA-C  10/15/2012 9:14 AM

## 2012-10-28 ENCOUNTER — Telehealth: Payer: Self-pay | Admitting: Cardiology

## 2012-10-28 NOTE — Telephone Encounter (Signed)
Left message for pt to call.

## 2012-10-28 NOTE — Telephone Encounter (Signed)
Spoke with pt, he reports he is not abler to get the swelling in his feet to go down. He has taken furosemide twice daily for aboput one week now with no response. He has edema in his feet and ankles. His weight is up 5 to 6 lbs. He does get SOB and occ will wake up panting to catch his breath. He has a cough that is occ productive of brownish sputum. Pt will come to the office tomorrow to see [facility] pac. Pt agreed with this plan.

## 2012-10-28 NOTE — Telephone Encounter (Signed)
New Problem:  Pt states he has some questions about some swelling in his legs. Pt states he was in the hospital recently and they gave him lasix. Please advise

## 2012-10-29 ENCOUNTER — Encounter: Payer: Self-pay | Admitting: Physician Assistant

## 2012-10-29 ENCOUNTER — Ambulatory Visit (INDEPENDENT_AMBULATORY_CARE_PROVIDER_SITE_OTHER): Payer: BC Managed Care – PPO | Admitting: Physician Assistant

## 2012-10-29 VITALS — BP 128/82 | HR 97 | Ht 72.0 in | Wt 220.0 lb

## 2012-10-29 DIAGNOSIS — I2510 Atherosclerotic heart disease of native coronary artery without angina pectoris: Secondary | ICD-10-CM

## 2012-10-29 DIAGNOSIS — I6529 Occlusion and stenosis of unspecified carotid artery: Secondary | ICD-10-CM

## 2012-10-29 DIAGNOSIS — I5022 Chronic systolic (congestive) heart failure: Secondary | ICD-10-CM

## 2012-10-29 DIAGNOSIS — I6523 Occlusion and stenosis of bilateral carotid arteries: Secondary | ICD-10-CM

## 2012-10-29 DIAGNOSIS — I2589 Other forms of chronic ischemic heart disease: Secondary | ICD-10-CM

## 2012-10-29 DIAGNOSIS — E119 Type 2 diabetes mellitus without complications: Secondary | ICD-10-CM

## 2012-10-29 DIAGNOSIS — I658 Occlusion and stenosis of other precerebral arteries: Secondary | ICD-10-CM

## 2012-10-29 DIAGNOSIS — I5023 Acute on chronic systolic (congestive) heart failure: Secondary | ICD-10-CM

## 2012-10-29 DIAGNOSIS — E785 Hyperlipidemia, unspecified: Secondary | ICD-10-CM

## 2012-10-29 DIAGNOSIS — I10 Essential (primary) hypertension: Secondary | ICD-10-CM

## 2012-10-29 LAB — BASIC METABOLIC PANEL
CO2: 27 mEq/L (ref 19–32)
Calcium: 9.2 mg/dL (ref 8.4–10.5)
Chloride: 99 mEq/L (ref 96–112)
Creatinine, Ser: 0.9 mg/dL (ref 0.4–1.5)
Glucose, Bld: 261 mg/dL — ABNORMAL HIGH (ref 70–99)
Sodium: 135 mEq/L (ref 135–145)

## 2012-10-29 MED ORDER — POTASSIUM CHLORIDE CRYS ER 20 MEQ PO TBCR: EXTENDED_RELEASE_TABLET | ORAL | Status: DC

## 2012-10-29 MED ORDER — METOLAZONE 2.5 MG PO TABS: ORAL_TABLET | ORAL | Status: DC

## 2012-10-29 NOTE — Patient Instructions (Addendum)
Increase Lasix to 40 mg one tablet twice daily.   Take Metolazone 2.5 mg 30 minutes before Lasix today and again tomorrow (2 doses total). Take Potassium 20 mEq once today and once tomorrow.  Weigh daily. Call if weight is staying the same or increasing.  Labs today:  BMET, BNP (428.23, 786.05). Repeat Labs Friday morning:  BMET (428.23). 11/01/12  Schedule follow up with Tereso Newcomer in 1 week. 11/05/12 @ 8:50

## 2012-10-29 NOTE — Progress Notes (Signed)
[address], [address] [place], [place]  [address] Phone: [phone] Fax:  [phone]  Date:  10/29/2012   ID:  ANZEL KEARSE, DOB 11-14-1955, MRN [id number]  PCP:  [name], MD  Cardiologist:  Dr. [name]   Electrophysiologist:  Dr. Sherryl Manges    History of Present Illness: [name] is a 57 y.o. male who returns for the evaluation of dyspnea and LE edema.   He has a hx of CAD, s/p ant MI in 2005 req IABP tx with Horizon study stent to the LAD followed by staged BMS to the CFX and DES x 2 to RCA, Ischemic CM, systolic CHF, s/p AICD, HTN, DM2, HL, carotid stenosis, PAD.  Last LHC (6/06):  EF 25%, pLAD stent 40-50, after stent 40, dLAD 20, pCFX 20, pOM1 70, pRCA 20, mRCA stents ok.  Abdominal US (2/11):  No AAA.  Myoview (7/14):  EF 26%, large ant, septal, inf and apical infarct, no ischemia.  Med Rx continued.  Carotid US (7/14):  Bilateral 60-79% => f/u 6 mos.  ABIs (9/13): normal on R and mod on L => seen by Dr. Kirke Corin and med Rx recommended.   Admitted 09/2012 with a/c systolic CHF. Echo (09/27/12): EF 20%, diffuse HK with periapical AK, no LV thrombus noted, restrictive physiology, trivial MR, mild LAE, RVSF mildly reduced, PASP 39.  Medication adjustments were somewhat limited by hypotension. He diuresed 11.8 L. Patient could not tolerate spironolactone due to low blood pressure (potassium was also elevated). Actos was discontinued and he wasto followup with his PCP for diabetes.    I saw him in f/u 10/15/12.  BP was too soft to advance medications further.  I felt digoxin could be considered given his reduced LVF but decided to see how a recent adjustment in Carvedilol would affect his HR first.  He called in yesterday with increased edema.  Over the last week, he has noted increasing LE edema and increasing dyspnea with exertion. He really describes NYHA class II-IIb symptoms. He has noted orthopnea. Denies PND. He has occasional chest discomfort.  This is a chronic  symptom without change.  He denies syncope.  Labs (9/14):   K 4.1, creatinine 0.77, BNP 1366, Hgb 14.2, TSH 2.565,  Labs (10/14): K 4.8, creatinine 0.8  Wt Readings from Last 3 Encounters:  10/29/12 220 lb (99.791 kg)  10/15/12 210 lb (95.255 kg)  10/07/12 207 lb (93.895 kg)     Past Medical History  Diagnosis Date  . Hyperlipidemia     mixed  . Chronic systolic heart failure   . ICD (implantable cardiac defibrillator), dual, in situ     St. Jude for severe LVD EF 25% 2/07 explanted 2010. Medtronic Virtuoso II DR Dual-chamber cardiverter-defibrillator  with pocket revision, Dr. Graciela Husbands  . Tobacco abuse   . Ischemic cardiomyopathy     a. Echo (09/27/12): EF 20%, diffuse HK with periapical AK, no LV thrombus noted, restrictive physiology, trivial MR, mild LAE, RVSF mildly reduced, PASP 39  . Cerebrovascular disease   . Diabetes mellitus   . HTN (hypertension)   . PVD (peripheral vascular disease)     a. ABI 09/2011 - R normal, L moderate - saw Dr. Kirke Corin - med rx.   . Carotid artery disease     a. Dopp 09/2011: 60-79% RICA, 40-59% LICA.;  b.  Carotid US (7/14):  Bilateral 60-79% => f/u 6 mos  . Coronary artery disease     a. AWMI requiring  IABP 2005 s/p Horizon study stent-LAD, staged BMS-Cx, DESx2-RCA. b. Last Last LHC (6/06):  EF 25%, pLAD stent 40-50, after stent 40, dLAD 20, pCFX 20, pOM1 70, pRCA 20, mRCA stents ok.=> med Rx;  c.  Myoview (7/14):  EF 26%, large ant, septal, inf and apical infarct, no ischemia.  Med Rx continued    Current Outpatient Prescriptions  Medication Sig Dispense Refill  . aspirin 81 MG tablet Take 1 tablet (81 mg total) by mouth daily.  30 tablet  3  . atorvastatin (LIPITOR) 80 MG tablet Take 80 mg by mouth daily.      . benazepril (LOTENSIN) 5 MG tablet Take 0.5 tablets (2.5 mg total) by mouth daily.  30 tablet  3  . carvedilol (COREG) 6.25 MG tablet Take 1.5 tablets (9.375 mg total) by mouth 2 (two) times daily with a meal.  60 tablet  0  . ezetimibe  (ZETIA) 10 MG tablet Take 10 mg by mouth daily.      . furosemide (LASIX) 40 MG tablet Take 1 tablet (40 mg total) by mouth daily. May take an additional 1 tablet daily as needed for weight gain of 2-3 lbs overnight.  60 tablet  3  . glimepiride (AMARYL) 4 MG tablet Take 4 mg by mouth daily before breakfast.      . metFORMIN (GLUCOPHAGE-XR) 500 MG 24 hr tablet Take 500 mg by mouth 2 (two) times daily.       No current facility-administered medications for this visit.    Allergies:    Allergies  Allergen Reactions  . Niacin     REACTION: Reaction not known    Social History:  The patient  reports that he has been smoking Cigarettes.  He has a 33 pack-year smoking history. He does not have any smokeless tobacco history on file. He reports that he does not drink alcohol or use illicit drugs.   Family History:  The patient's family history includes Coronary artery disease in his father; Diabetes in his mother.  ROS:  Please see the history of present illness.   He has a nonproductive cough that has been persistent. No fevers, melena, hematochezia   All other systems reviewed and negative.   PHYSICAL EXAM: VS:  BP 128/82  Pulse 97  Ht 6' (1.829 m)  Wt 220 lb (99.791 kg)  BMI 29.83 kg/m2 Well nourished, well developed, in no acute distress HEENT: normal Neck: + JVD Cardiac:  normal S1, S2; RRR; no murmur; no S3 Lungs:  Faint crackles in the bases bilaterally, no wheezing, rhonchi  Abd: soft, nontender, no hepatomegaly Ext: 1-2+ brawny bilateral LE edema Skin: warm and dry Neuro:  CNs 2-12 intact, no focal abnormalities noted  EKG:   NSR, HR 97, inferolateral Q waves, PVC, no changes  ASSESSMENT AND PLAN:  1. Acute on Chronic Systolic CHF:  He is volume overloaded.  He tried taking Lasix 40 bid last week without much benefit.  He is back on Lasix 40 QD.  I will add metolazone 2.5 QD x 2 days (30 mins before Lasix) and increase Lasix to 40 bid.  Add K+ 20 QD x 2 days.  Check BMET and  BNP today and repeat BMET this Friday.  He will return for close follow up.  If no improvement or worsening, he will likely need to be admitted for IV diuresis.   2. Ischemic CM:  Continue beta blocker, ACEI.   3. CAD:  No angina.  Continue ASA, statin. 4. Hypertension:  Controlled.  5. Hyperlipidemia:  Continue statin. 6. Diabetes Mellitus:  F/u with PCP for management now that he is off Actos.  7. Carotid Stenosis:  F/u Carotid US due in 01/2013. 8. S/p AICD:  F/u with EP as planned as device approaching ERI. 9. Tobacco Abuse:  He knows that he needs to quit.  10. Disposition:  F/u with me in 1 week.    Signed, Tereso Newcomer, PA-C  10/29/2012 9:30 AM

## 2012-11-01 ENCOUNTER — Other Ambulatory Visit (INDEPENDENT_AMBULATORY_CARE_PROVIDER_SITE_OTHER): Payer: BC Managed Care – PPO

## 2012-11-01 ENCOUNTER — Encounter (INDEPENDENT_AMBULATORY_CARE_PROVIDER_SITE_OTHER): Payer: Self-pay

## 2012-11-01 ENCOUNTER — Telehealth: Payer: Self-pay | Admitting: *Deleted

## 2012-11-01 DIAGNOSIS — I2589 Other forms of chronic ischemic heart disease: Secondary | ICD-10-CM

## 2012-11-01 DIAGNOSIS — I5022 Chronic systolic (congestive) heart failure: Secondary | ICD-10-CM

## 2012-11-01 DIAGNOSIS — I5023 Acute on chronic systolic (congestive) heart failure: Secondary | ICD-10-CM

## 2012-11-01 DIAGNOSIS — I10 Essential (primary) hypertension: Secondary | ICD-10-CM

## 2012-11-01 LAB — BASIC METABOLIC PANEL
BUN: 22 mg/dL (ref 6–23)
CO2: 32 mEq/L (ref 19–32)
Creatinine, Ser: 1.3 mg/dL (ref 0.4–1.5)
GFR: 61.34 mL/min (ref >60.00)
Glucose, Bld: 167 mg/dL — ABNORMAL HIGH (ref 70–99)
Potassium: 3.4 mEq/L — ABNORMAL LOW (ref 3.5–5.1)
Sodium: 133 mEq/L — ABNORMAL LOW (ref 135–145)

## 2012-11-01 NOTE — Telephone Encounter (Signed)
x 2 lmom for lab results and to take K+ 40 meq tonight, will get bmet 11/4 when he sees [place]. PA,.

## 2012-11-01 NOTE — Telephone Encounter (Signed)
x 2 lmom for lab results and to take K+ 40 meq tonight, will get bmet 11/4 when he sees Scott W. PA,. 

## 2012-11-01 NOTE — Telephone Encounter (Signed)
lmptcb on both pt's phone and his sister Debbie's phone for cb to go over lab results . I will try again before I leave today

## 2012-11-04 NOTE — Telephone Encounter (Signed)
cb pt to make sure he got message 11/01/12 about taking K+ 40 meq that night and to repeat bmet 11/4 appt w/SW. PA.Marland KitchenPt said yes he got my message and said leg edema is getting better.

## 2012-11-05 ENCOUNTER — Encounter (INDEPENDENT_AMBULATORY_CARE_PROVIDER_SITE_OTHER): Payer: Self-pay

## 2012-11-05 ENCOUNTER — Encounter: Payer: Self-pay | Admitting: Physician Assistant

## 2012-11-05 ENCOUNTER — Ambulatory Visit (INDEPENDENT_AMBULATORY_CARE_PROVIDER_SITE_OTHER): Payer: BC Managed Care – PPO | Admitting: Physician Assistant

## 2012-11-05 ENCOUNTER — Other Ambulatory Visit: Payer: BC Managed Care – PPO

## 2012-11-05 VITALS — BP 110/62 | HR 117 | Ht 72.0 in | Wt 206.0 lb

## 2012-11-05 DIAGNOSIS — I2589 Other forms of chronic ischemic heart disease: Secondary | ICD-10-CM

## 2012-11-05 DIAGNOSIS — I6523 Occlusion and stenosis of bilateral carotid arteries: Secondary | ICD-10-CM

## 2012-11-05 DIAGNOSIS — R000 Tachycardia, unspecified: Secondary | ICD-10-CM

## 2012-11-05 DIAGNOSIS — I6529 Occlusion and stenosis of unspecified carotid artery: Secondary | ICD-10-CM

## 2012-11-05 DIAGNOSIS — I5022 Chronic systolic (congestive) heart failure: Secondary | ICD-10-CM

## 2012-11-05 DIAGNOSIS — I2510 Atherosclerotic heart disease of native coronary artery without angina pectoris: Secondary | ICD-10-CM

## 2012-11-05 DIAGNOSIS — I10 Essential (primary) hypertension: Secondary | ICD-10-CM

## 2012-11-05 DIAGNOSIS — I498 Other specified cardiac arrhythmias: Secondary | ICD-10-CM

## 2012-11-05 DIAGNOSIS — I658 Occlusion and stenosis of other precerebral arteries: Secondary | ICD-10-CM

## 2012-11-05 LAB — BASIC METABOLIC PANEL
BUN: 23 mg/dL (ref 6–23)
CO2: 30 mEq/L (ref 19–32)
Calcium: 9.2 mg/dL (ref 8.4–10.5)
Chloride: 94 mEq/L — ABNORMAL LOW (ref 96–112)
Creatinine, Ser: 1 mg/dL (ref 0.4–1.5)
Sodium: 132 mEq/L — ABNORMAL LOW (ref 135–145)

## 2012-11-05 MED ORDER — CARVEDILOL 12.5 MG PO TABS: 12.5000 mg | ORAL_TABLET | Freq: Two times a day (BID) | ORAL | Status: DC

## 2012-11-05 NOTE — Progress Notes (Signed)
[address] [place], [place]  [address] Phone: [phone] Fax:  [phone]  Date:  11/05/2012   ID:  [name], DOB November 07, 1955, MRN [id number]  PCP:  [name], MD  Cardiologist:  Dr. [name]   Electrophysiologist:  Dr. Sherryl Manges    History of Present Illness: [name] is a 57 y.o. male who returns for followup on CHF.  He has a hx of CAD, s/p ant MI in 2005 req IABP tx with Horizon study stent to the LAD followed by staged BMS to the CFX and DES x 2 to RCA, Ischemic CM, systolic CHF, s/p AICD, HTN, DM2, HL, carotid stenosis, PAD. Last LHC (6/06): EF 25%, pLAD stent 40-50, after stent 40, dLAD 20, pCFX 20, pOM1 70, pRCA 20, mRCA stents ok. Abdominal US (2/11): No AAA. Myoview (7/14): EF 26%, large ant, septal, inf and apical infarct, no ischemia. Med Rx continued. Carotid US (7/14): Bilateral 60-79% => f/u 6 mos. ABIs (9/13): normal on R and mod on L => seen by Dr. Kirke Corin and med Rx recommended.   Admitted 09/2012 with a/c systolic CHF. Echo (09/27/12): EF 20%, diffuse HK with periapical AK, no LV thrombus noted, restrictive physiology, trivial MR, mild LAE, RVSF mildly reduced, PASP 39. Spironolactone was not tolerated due to low BP and high K+.  I saw him last week for volume overload and adjusted his Lasix and added Metolazone x 2 days.  He is feeling much better. His LE edema is reduced. He denies orthopnea or PND. Breathing is improved. He is probably NYHA class IIb. He denies chest pain. He denies syncope.  Recent Labs: 09/25/2012: Hemoglobin 14.2  09/26/2012: TSH 2.565  11/01/2012: Creatinine 1.3; Potassium 3.4* 10/29/2012: Pro B Natriuretic peptide (BNP) 543.0*   Wt Readings from Last 3 Encounters:  11/05/12 206 lb (93.441 kg)  10/29/12 220 lb (99.791 kg)  10/15/12 210 lb (95.255 kg)     Past Medical History  Diagnosis Date  . Hyperlipidemia     mixed  . Chronic systolic heart failure   . ICD (implantable cardiac defibrillator),  dual, in situ     St. Jude for severe LVD EF 25% 2/07 explanted 2010. Medtronic Virtuoso II DR Dual-chamber cardiverter-defibrillator  with pocket revision, Dr. Graciela Husbands  . Tobacco abuse   . Ischemic cardiomyopathy     a. Echo (09/27/12): EF 20%, diffuse HK with periapical AK, no LV thrombus noted, restrictive physiology, trivial MR, mild LAE, RVSF mildly reduced, PASP 39  . Cerebrovascular disease   . Diabetes mellitus   . HTN (hypertension)   . PVD (peripheral vascular disease)     a. ABI 09/2011 - R normal, L moderate - saw Dr. Kirke Corin - med rx.   . Carotid artery disease     a. Dopp 09/2011: 60-79% RICA, 40-59% LICA.;  b.  Carotid US (7/14):  Bilateral 60-79% => f/u 6 mos  . Coronary artery disease     a. AWMI requiring IABP 2005 s/p Horizon study stent-LAD, staged BMS-Cx, DESx2-RCA. b. Last Last LHC (6/06):  EF 25%, pLAD stent 40-50, after stent 40, dLAD 20, pCFX 20, pOM1 70, pRCA 20, mRCA stents ok.=> med Rx;  c.  Myoview (7/14):  EF 26%, large ant, septal, inf and apical infarct, no ischemia.  Med Rx continued    Current Outpatient Prescriptions  Medication Sig Dispense Refill  . aspirin 81 MG tablet Take 1 tablet (81 mg total) by mouth daily.  30 tablet  3  . atorvastatin (LIPITOR) 80 MG tablet Take 80 mg by mouth daily.      . benazepril (LOTENSIN) 5 MG tablet Take 0.5 tablets (2.5 mg total) by mouth daily.  30 tablet  3  . carvedilol (COREG) 6.25 MG tablet Take 1.5 tablets (9.375 mg total) by mouth 2 (two) times daily with a meal.  60 tablet  0  . ezetimibe (ZETIA) 10 MG tablet Take 10 mg by mouth daily.      . furosemide (LASIX) 40 MG tablet Take 1 tablet (40 mg total) by mouth daily. May take an additional 1 tablet daily as needed for weight gain of 2-3 lbs overnight.  60 tablet  3  . glimepiride (AMARYL) 4 MG tablet Take 4 mg by mouth daily before breakfast.      . metFORMIN (GLUCOPHAGE-XR) 500 MG 24 hr tablet Take 500 mg by mouth 2 (two) times daily.      . potassium chloride SA  (K-DUR,KLOR-CON) 20 MEQ tablet Take 20 meq for 2 days  10 tablet  1  . metolazone (ZAROXOLYN) 2.5 MG tablet Take 2.5 mg 10/29/12 and 10/30/12  10 tablet  1   No current facility-administered medications for this visit.    Allergies:   Niacin   Social History:  The patient  reports that he has been smoking Cigarettes.  He has a 33 pack-year smoking history. He does not have any smokeless tobacco history on file. He reports that he does not drink alcohol or use illicit drugs.   Family History:  The patient's family history includes Coronary artery disease in his father; Diabetes in his mother.   ROS:  Please see the history of present illness.      All other systems reviewed and negative.   PHYSICAL EXAM: VS:  BP 110/62  Pulse 117  Ht 6' (1.829 m)  Wt 206 lb (93.441 kg)  BMI 27.93 kg/m2 Well nourished, well developed, in no acute distress HEENT: normal Neck: no JVD Cardiac:  normal S1, S2; RRR; no murmur Lungs:  clear to auscultation bilaterally, no wheezing, rhonchi or rales Abd: soft, nontender, no hepatomegaly Ext: no edema Skin: warm and dry Neuro:  CNs 2-12 intact, no focal abnormalities noted  EKG:  Sinus tachycardia, HR 117     ASSESSMENT AND PLAN:  1. Chronic Systolic CHF:  Volume is improved.  He has lost 14 pounds.  He is feeling much better. Continue current dose of Lasix. Check a basic metabolic panel today. We discussed the importance of weighing daily and to take a metolazone plus potassium should he gain 3 pounds or more in one day. 2. Ischemic Cardiomyopathy:  Continue current dose of ACE inhibitor. Heart rate remains elevated. He has not taken medications yet today. I will increase his Coreg to 12.5 mg twice a day. 3. CAD:  No angina. Continue aspirin, statin. 4. Hypertension:  Controlled. 5. Hyperlipidemia:  Continue statin. 6. Diabetes Mellitus:  Follow up with primary care. 7. Carotid Stenosis:  F/u US in 01/2013.   8. S/p AICD:  Follow up with EP as  planned. 9. Tobacco Abuse:  He is trying to quit. 10. Disposition:  Keep follow up with Dr. Jens Som next month.   Signed, Tereso Newcomer, PA-C  11/05/2012 8:56 AM

## 2012-11-05 NOTE — Patient Instructions (Addendum)
CONTINUE THE SAME REGIMEN OF LASIX YOU ARE CURRENTLY ON  INCREASE COREG TO 12.5 MG TWICE DAILY; NEW RX WAS SENT IN FOR THE 12.5 MG TABLET  LAB TODAY; BMET  MAKE SURE TO WEIGH DAILY AND IF WEIGHT IS UP BY 3 LB X 1 DAY OK THEN TO TAKE METOLAZONE AND A POTASSIUM 30 MINUTES BEFORE MORNING LASIX; PLEASE CALL THE OFFICE TO LET SCOTT WEAVER, PAC KNOW OF THIS.  MAKE SURE TO KEEP YOUR APPT WITH BROOKE 11/2012  MAKE SURE TO KEEP YOUR APPT WITH DR. CRENSHAW 12/2012

## 2012-11-06 ENCOUNTER — Telehealth: Payer: Self-pay | Admitting: *Deleted

## 2012-11-06 NOTE — Telephone Encounter (Signed)
lmom K+ and creatinine, needs to get better control of his DM per Bing Neighbors. PA

## 2012-11-22 ENCOUNTER — Encounter: Payer: Self-pay | Admitting: *Deleted

## 2012-11-22 ENCOUNTER — Encounter: Payer: Self-pay | Admitting: Cardiology

## 2012-11-22 ENCOUNTER — Ambulatory Visit (INDEPENDENT_AMBULATORY_CARE_PROVIDER_SITE_OTHER): Payer: BC Managed Care – PPO | Admitting: Cardiology

## 2012-11-22 VITALS — BP 114/78 | HR 115 | Ht 72.0 in | Wt 219.0 lb

## 2012-11-22 DIAGNOSIS — I5022 Chronic systolic (congestive) heart failure: Secondary | ICD-10-CM

## 2012-11-22 DIAGNOSIS — T82198D Other mechanical complication of other cardiac electronic device, subsequent encounter: Secondary | ICD-10-CM

## 2012-11-22 DIAGNOSIS — I255 Ischemic cardiomyopathy: Secondary | ICD-10-CM

## 2012-11-22 DIAGNOSIS — Z5189 Encounter for other specified aftercare: Secondary | ICD-10-CM

## 2012-11-22 DIAGNOSIS — I2510 Atherosclerotic heart disease of native coronary artery without angina pectoris: Secondary | ICD-10-CM

## 2012-11-22 DIAGNOSIS — I2589 Other forms of chronic ischemic heart disease: Secondary | ICD-10-CM

## 2012-11-22 DIAGNOSIS — Z4502 Encounter for adjustment and management of automatic implantable cardiac defibrillator: Secondary | ICD-10-CM

## 2012-11-22 DIAGNOSIS — Z95810 Presence of automatic (implantable) cardiac defibrillator: Secondary | ICD-10-CM

## 2012-11-22 LAB — MDC_IDC_ENUM_SESS_TYPE_INCLINIC
Battery Voltage: 2.41 V
Brady Statistic RV Percent Paced: 1 %
Lead Channel Pacing Threshold Amplitude: 0.5 V
Lead Channel Pacing Threshold Pulse Width: 0.5 ms
Lead Channel Setting Pacing Amplitude: 2.5 V
Lead Channel Setting Pacing Pulse Width: 0.5 ms
Lead Channel Setting Sensing Sensitivity: 0.3 mV
Zone Setting Detection Interval: 250 ms
Zone Setting Detection Interval: 330 ms

## 2012-11-22 NOTE — Patient Instructions (Signed)
Your physician has recommended that you have a defibrillator inserted WITH POSSIBLE RV LEAD REVISION (12-16-12). An implantable cardioverter defibrillator (ICD) is a small device that is placed in your chest or, in rare cases, your abdomen. This device uses electrical pulses or shocks to help control life-threatening, irregular heartbeats that could lead the heart to suddenly stop beating (sudden cardiac arrest). Leads are attached to the ICD that goes into your heart. This is done in the hospital and usually requires an overnight stay. Please see the instruction sheet given to you today for more information.  Your physician recommends that you return for lab work in: (12-10-12) BMET, CBC, PT-INR  Your physician has recommended you make the following change in your medication:   HOLD MORNING OF PROCEDURE( LASIX, METFORMIN, GLIMEPIRIDE)  Your physician recommends that you continue on your current medications as directed. Please refer to the Current Medication list given to you today.

## 2012-11-22 NOTE — Progress Notes (Signed)
ELECTROPHYSIOLOGY OFFICE NOTE  Patient ID: ASHAAD GAERTNER MRN: [id number], DOB/AGE: 1955-10-17   Date of Visit: 11/22/2012  Primary Physician: [name], MD Primary Cardiologist / Primary EP: Jens Som, MD / Graciela Husbands, MD Reason for Visit: EP/device follow-up  History of Present Illness  [name] is a 57 y.o. male with an ischemic CM s/p ICD implant for primary prevention in 2007, chronic systolic HF, CAD s/p anterior MI and subsequent cardiogenic shock requiring IABP, PCI to LCx and RCA and PVD who presents today for routine electrophysiology followup. His ICD battery is at [facility].  Since last being seen in our clinic, he reports he is doing better than he was back in September at which time he was hospitalized with pneumonia and acute on chronic systolic HF. Since discharge, he has had some intermittent volume overload for which he has been evaluated by Tereso Newcomer, PA-C. His Lasix has been adjusted accordingly and metolazone added as needed. He reports his DOE is improved and has been stable. He has persistent LE swelling but this is also improving. He denies chest pain. He denies palpitations, dizziness, near syncope or syncope. He denies orthopnea or PND. He is compliant and tolerating medications without difficulty. He does not add salt to his food but eats canned vegetables fairly regularly.  Past Medical History Past Medical History  Diagnosis Date  . Hyperlipidemia     mixed  . Chronic systolic heart failure   . ICD (implantable cardiac defibrillator), dual, in situ     St. Jude for severe LVD EF 25% 2/07 explanted 2010. Medtronic Virtuoso II DR Dual-chamber cardiverter-defibrillator  with pocket revision, Dr. Graciela Husbands  . Tobacco abuse   . Ischemic cardiomyopathy     a. Echo (09/27/12): EF 20%, diffuse HK with periapical AK, no LV thrombus noted, restrictive physiology, trivial MR, mild LAE, RVSF mildly reduced, PASP 39  . Cerebrovascular disease   . Diabetes mellitus   . HTN  (hypertension)   . PVD (peripheral vascular disease)     a. ABI 09/2011 - R normal, L moderate - saw Dr. Kirke Corin - med rx.   . Carotid artery disease     a. Dopp 09/2011: 60-79% RICA, 40-59% LICA.;  b.  Carotid US (7/14):  Bilateral 60-79% => f/u 6 mos  . Coronary artery disease     a. AWMI requiring IABP 2005 s/p Horizon study stent-LAD, staged BMS-Cx, DESx2-RCA. b. Last Last LHC (6/06):  EF 25%, pLAD stent 40-50, after stent 40, dLAD 20, pCFX 20, pOM1 70, pRCA 20, mRCA stents ok.=> med Rx;  c.  Myoview (7/14):  EF 26%, large ant, septal, inf and apical infarct, no ischemia.  Med Rx continued    Past Surgical History Past Surgical History  Procedure Laterality Date  . Medtronic virtuoso ii dr dual-chamber cardioverter-defibrillation with pocket revison      Dr. Sherryl Manges    Allergies/Intolerances Allergies  Allergen Reactions  . Niacin     REACTION: Reaction not known    Current Home Medications Current Outpatient Prescriptions  Medication Sig Dispense Refill  . aspirin 81 MG tablet Take 1 tablet (81 mg total) by mouth daily.  30 tablet  3  . atorvastatin (LIPITOR) 80 MG tablet Take 80 mg by mouth every other day.       . benazepril (LOTENSIN) 5 MG tablet Take 0.5 tablets (2.5 mg total) by mouth daily.  30 tablet  3  . carvedilol (COREG) 12.5 MG tablet Take 1 tablet (12.5 mg total)  by mouth 2 (two) times daily with a meal.  60 tablet  11  . ezetimibe (ZETIA) 10 MG tablet Take 10 mg by mouth daily.      . furosemide (LASIX) 40 MG tablet Take 1 tablet (40 mg total) by mouth daily. May take an additional 1 tablet daily as needed for weight gain of 2-3 lbs overnight.  60 tablet  3  . glimepiride (AMARYL) 4 MG tablet Take 4 mg by mouth daily before breakfast.      . metFORMIN (GLUCOPHAGE-XR) 500 MG 24 hr tablet Take 500 mg by mouth 2 (two) times daily.      . metolazone (ZAROXOLYN) 2.5 MG tablet Take 2.5 mg 10/29/12 and 10/30/12(TWO TABLETS PER WEEK)      . potassium chloride SA  (K-DUR,KLOR-CON) 20 MEQ tablet Take 20 meq for 2 days(TWO TABLETS PER WEEK WHEN TAKING METOLAZONE)       No current facility-administered medications for this visit.    Social History Social History  . Marital Status: Single   Occupational History  . [employer]    Full time   Social History Main Topics  . Smoking status: Current Every Day Smoker -- 1.00 packs/day for 33 years    Types: Cigarettes  . Smokeless tobacco: Not on file  . Alcohol Use: No  . Drug Use: No     Comment: former Cocaine, "Acid", Marijuana - 25 years ago   Review of Systems General: No chills, fever, night sweats or weight changes Cardiovascular: No chest pain, edema, orthopnea, palpitations, paroxysmal nocturnal dyspnea Dermatological: No rash, lesions or masses Respiratory: No cough, dyspnea Urologic: No hematuria, dysuria Abdominal: No nausea, vomiting, diarrhea, bright red blood per rectum, melena, or hematemesis Neurologic: No visual changes, weakness, changes in mental status All other systems reviewed and are otherwise negative except as noted above.  Physical Exam Vitals: Blood pressure 114/78, pulse 115, height 6' (1.829 m), weight 219 lb (99.338 kg).  General: Well developed, well appearing 57 y.o. male in no acute distress. HEENT: Normocephalic, atraumatic. EOMs intact. Sclera nonicteric. Oropharynx clear.  Neck: Supple. No JVD. Lungs: Respirations regular and unlabored, CTA bilaterally. No wheezes, rales or rhonchi. Heart: RRR. S1, S2 present. No murmurs, rub, S3 or S4. Abdomen: Soft, non-tender, non-distended. BS present x 4 quadrants. No hepatosplenomegaly.  Extremities: No clubbing or cyanosis. 1+ LE edema bilaterally with venous stasis changes noted. PT/Radials 2+ and equal bilaterally. Psych: Normal affect. Neuro: Alert and oriented X 3. Moves all extremities spontaneously.   Diagnostics Echocardiogram Sept 2014 Study Conclusions - Left ventricle: The cavity size was mildly  dilated. Wall thickness was normal. The estimated ejection fraction was 20%. Diffuse hypokinesis with peri-apical akinesis. Definity contrast was used. No LV thrombus noted. Doppler parameters are consistent with restrictive physiology, indicative of decreased left ventricular diastolic compliance and/or increased left atrial pressure. - Aortic valve: There was no stenosis. - Mitral valve: Trivial regurgitation. - Left atrium: The atrium was mildly dilated. - Right ventricle: Poorly visualized. The cavity size was normal. Systolic function was mildly reduced. - Tricuspid valve: Peak RV-RA gradient: 29mm Hg (S). - Pulmonary arteries: PA peak pressure: 39mm Hg (S). - Systemic veins: IVC measured 2.4 cm with some respirophasic variation, suggesing RA pressure 10 mmHg. Impression: - Mildly dilated LV with EF 20%. Diffuse hypokinesis with peri-apical akinesis. No LV thrombus noted. Restrictive diastolic function. Normal RV size with mildly decreased systolic function (RV poorly visualized). 12-lead ECG today - sinus tachycardia at 106 bpm; inferolateral Q  waves; QRS duration 112 msec Device interrogation today - Battery at [facility] since 10/28/2012. RV lead (Riata) on recall. Otherwise normal device function. Thresholds and sensing consistent with previous device measurements. Impedance trends stable over time. No evidence of any ventricular arrhythmias. Histogram distribution right shifted and ECG today shows sinus tach. Coreg being up-titrated. No programming changes made this session. Device programmed at appropriate safety margins. Device programmed to optimize intrinsic conduction. Patient scheduled for ICD generator change and possible RV lead revision (will have RV lead fluoroscopy done) with Dr. Graciela Husbands.  Assessment and Plan 1. ICD battery at [facility] 2. RV lead (Riata) on recall 3. Chronic systolic HF 4. Ischemic CM 5. CAD  Mr. Kimberlin presents for EP follow-up. His ICD battery is at [facility].  Discussed the need for ICD generator change. Risks, benefits and alternatives to ICD generator change were discussed in detail. In addition, I explained his RV lead is on recall and Dr. Graciela Husbands will perform fluoroscopy to determine if RV lead revision is needed. If only ICD generator change is needed, the risks include, but are not limited to, bleeding and infection. If RV lead revision is also needed, the risks include, but are not limited to, bleeding, infection, pneumothorax, perforation, tamponade, vascular damage, renal failure, lead disodgement, MI, stroke and death. Mr. Alban expressed verbal understanding and wishes to proceed. This will be scheduled with Dr. Graciela Husbands at his next available time. His HF is improving as Lorin Picket has up-titrated Lasix. He does not meet criteria for CRT due to QRS duration.  Signed, Rick Duff, PA-C 11/22/2012, 9:04 AM

## 2012-11-25 ENCOUNTER — Other Ambulatory Visit: Payer: Self-pay | Admitting: Cardiology

## 2012-11-29 ENCOUNTER — Encounter: Payer: BC Managed Care – PPO | Admitting: Cardiology

## 2012-12-02 ENCOUNTER — Encounter: Payer: Self-pay | Admitting: Internal Medicine

## 2012-12-02 ENCOUNTER — Encounter (HOSPITAL_COMMUNITY): Payer: Self-pay | Admitting: Pharmacy Technician

## 2012-12-02 ENCOUNTER — Emergency Department (HOSPITAL_COMMUNITY): Payer: BC Managed Care – PPO

## 2012-12-02 ENCOUNTER — Encounter (HOSPITAL_COMMUNITY): Payer: Self-pay | Admitting: Emergency Medicine

## 2012-12-02 DIAGNOSIS — Z7982 Long term (current) use of aspirin: Secondary | ICD-10-CM

## 2012-12-02 DIAGNOSIS — I10 Essential (primary) hypertension: Secondary | ICD-10-CM | POA: Diagnosis present

## 2012-12-02 DIAGNOSIS — E782 Mixed hyperlipidemia: Secondary | ICD-10-CM | POA: Diagnosis present

## 2012-12-02 DIAGNOSIS — I5023 Acute on chronic systolic (congestive) heart failure: Principal | ICD-10-CM | POA: Diagnosis present

## 2012-12-02 DIAGNOSIS — E119 Type 2 diabetes mellitus without complications: Secondary | ICD-10-CM | POA: Diagnosis present

## 2012-12-02 DIAGNOSIS — Z95810 Presence of automatic (implantable) cardiac defibrillator: Secondary | ICD-10-CM

## 2012-12-02 DIAGNOSIS — I739 Peripheral vascular disease, unspecified: Secondary | ICD-10-CM | POA: Diagnosis present

## 2012-12-02 DIAGNOSIS — E873 Alkalosis: Secondary | ICD-10-CM | POA: Diagnosis present

## 2012-12-02 DIAGNOSIS — I2589 Other forms of chronic ischemic heart disease: Secondary | ICD-10-CM | POA: Diagnosis present

## 2012-12-02 DIAGNOSIS — I6529 Occlusion and stenosis of unspecified carotid artery: Secondary | ICD-10-CM | POA: Diagnosis present

## 2012-12-02 DIAGNOSIS — F17200 Nicotine dependence, unspecified, uncomplicated: Secondary | ICD-10-CM | POA: Diagnosis present

## 2012-12-02 DIAGNOSIS — I509 Heart failure, unspecified: Secondary | ICD-10-CM | POA: Diagnosis present

## 2012-12-02 DIAGNOSIS — I2510 Atherosclerotic heart disease of native coronary artery without angina pectoris: Secondary | ICD-10-CM | POA: Diagnosis present

## 2012-12-02 LAB — CBC WITH DIFFERENTIAL/PLATELET
Eosinophils Absolute: 0.1 10*3/uL (ref 0.0–0.7)
Eosinophils Relative: 1 % (ref 0–5)
HCT: 42.7 % (ref 39.0–52.0)
Hemoglobin: 14.4 g/dL (ref 13.0–17.0)
Lymphs Abs: 1.4 10*3/uL (ref 0.7–4.0)
MCH: 30.5 pg (ref 26.0–34.0)
MCV: 90.5 fL (ref 78.0–100.0)
Monocytes Absolute: 0.5 10*3/uL (ref 0.1–1.0)
Monocytes Relative: 7 % (ref 3–12)
RBC: 4.72 MIL/uL (ref 4.22–5.81)

## 2012-12-02 LAB — COMPREHENSIVE METABOLIC PANEL
Albumin: 3.3 g/dL — ABNORMAL LOW (ref 3.5–5.2)
Alkaline Phosphatase: 99 U/L (ref 39–117)
BUN: 11 mg/dL (ref 6–23)
Calcium: 9.1 mg/dL (ref 8.4–10.5)
Chloride: 98 mEq/L (ref 96–112)
Creatinine, Ser: 0.8 mg/dL (ref 0.50–1.35)
GFR calc Af Amer: >90 mL/min (ref >90)
GFR calc non Af Amer: >90 mL/min (ref >90)
Glucose, Bld: 295 mg/dL — ABNORMAL HIGH (ref 70–99)
Potassium: 4.1 mEq/L (ref 3.5–5.1)
Total Protein: 6.6 g/dL (ref 6.0–8.3)

## 2012-12-02 LAB — PRO B NATRIURETIC PEPTIDE: Pro B Natriuretic peptide (BNP): 2717 pg/mL — ABNORMAL HIGH (ref 0–125)

## 2012-12-02 NOTE — ED Notes (Signed)
Pt c/o bil lower leg swelling and dry cough.  Today st's he had shortness of breath on exertion.  Pt speaking in full sentences.

## 2012-12-03 ENCOUNTER — Encounter (HOSPITAL_COMMUNITY): Payer: Self-pay | Admitting: General Practice

## 2012-12-03 ENCOUNTER — Inpatient Hospital Stay (HOSPITAL_COMMUNITY)
Admission: EM | Admit: 2012-12-03 | Discharge: 2012-12-11 | DRG: 292 | Disposition: A | Discharge status: Home / Self Care | Payer: BC Managed Care – PPO | Attending: Cardiology | Admitting: Cardiology

## 2012-12-03 DIAGNOSIS — E785 Hyperlipidemia, unspecified: Secondary | ICD-10-CM

## 2012-12-03 DIAGNOSIS — I5022 Chronic systolic (congestive) heart failure: Secondary | ICD-10-CM

## 2012-12-03 DIAGNOSIS — E873 Alkalosis: Secondary | ICD-10-CM

## 2012-12-03 DIAGNOSIS — E119 Type 2 diabetes mellitus without complications: Secondary | ICD-10-CM | POA: Diagnosis present

## 2012-12-03 DIAGNOSIS — Z95810 Presence of automatic (implantable) cardiac defibrillator: Secondary | ICD-10-CM

## 2012-12-03 DIAGNOSIS — I2510 Atherosclerotic heart disease of native coronary artery without angina pectoris: Secondary | ICD-10-CM

## 2012-12-03 DIAGNOSIS — I10 Essential (primary) hypertension: Secondary | ICD-10-CM

## 2012-12-03 DIAGNOSIS — I5023 Acute on chronic systolic (congestive) heart failure: Principal | ICD-10-CM | POA: Diagnosis present

## 2012-12-03 DIAGNOSIS — I2589 Other forms of chronic ischemic heart disease: Secondary | ICD-10-CM | POA: Diagnosis present

## 2012-12-03 DIAGNOSIS — Z4502 Encounter for adjustment and management of automatic implantable cardiac defibrillator: Secondary | ICD-10-CM

## 2012-12-03 DIAGNOSIS — T82198D Other mechanical complication of other cardiac electronic device, subsequent encounter: Secondary | ICD-10-CM

## 2012-12-03 DIAGNOSIS — F17200 Nicotine dependence, unspecified, uncomplicated: Secondary | ICD-10-CM | POA: Diagnosis present

## 2012-12-03 DIAGNOSIS — I5043 Acute on chronic combined systolic (congestive) and diastolic (congestive) heart failure: Secondary | ICD-10-CM

## 2012-12-03 DIAGNOSIS — I255 Ischemic cardiomyopathy: Secondary | ICD-10-CM

## 2012-12-03 HISTORY — DX: Other mechanical complication of other cardiac electronic device, initial encounter: T82.198A

## 2012-12-03 LAB — GLUCOSE, CAPILLARY
Glucose-Capillary: 250 mg/dL — ABNORMAL HIGH (ref 70–99)
Glucose-Capillary: 221 mg/dL — ABNORMAL HIGH (ref 70–99)
Glucose-Capillary: 184 mg/dL — ABNORMAL HIGH (ref 70–99)
Glucose-Capillary: 235 mg/dL — ABNORMAL HIGH (ref 70–99)

## 2012-12-03 LAB — POCT I-STAT TROPONIN I: Troponin i, poc: 0.01 ng/mL (ref 0.00–0.08)

## 2012-12-03 MED ORDER — SODIUM CHLORIDE 0.9 % IJ SOLN
3.0000 mL | Freq: Two times a day (BID) | INTRAMUSCULAR | Status: DC
  Administered 2012-12-03 – 2012-12-11 (×16): 3 mL via INTRAVENOUS

## 2012-12-03 MED ORDER — SODIUM CHLORIDE 0.9 % IJ SOLN: 3.0000 mL | INTRAMUSCULAR | Status: DC | PRN

## 2012-12-03 MED ORDER — ACETAMINOPHEN 325 MG PO TABS
650.0000 mg | ORAL_TABLET | ORAL | Status: DC | PRN
  Administered 2012-12-06: 650 mg via ORAL
  Filled 2012-12-03: qty 2

## 2012-12-03 MED ORDER — EZETIMIBE 10 MG PO TABS
10.0000 mg | ORAL_TABLET | Freq: Every day | ORAL | Status: DC
  Administered 2012-12-03 – 2012-12-11 (×9): 10 mg via ORAL
  Filled 2012-12-03 (×9): qty 1

## 2012-12-03 MED ORDER — ASPIRIN EC 81 MG PO TBEC
81.0000 mg | DELAYED_RELEASE_TABLET | Freq: Every day | ORAL | Status: DC
  Administered 2012-12-03 – 2012-12-11 (×9): 81 mg via ORAL
  Filled 2012-12-03 (×9): qty 1

## 2012-12-03 MED ORDER — SODIUM CHLORIDE 0.9 % IV SOLN: 250.0000 mL | INTRAVENOUS | Status: DC | PRN

## 2012-12-03 MED ORDER — CARVEDILOL 12.5 MG PO TABS
12.5000 mg | ORAL_TABLET | Freq: Two times a day (BID) | ORAL | Status: DC
  Administered 2012-12-03 – 2012-12-05 (×6): 12.5 mg via ORAL
  Filled 2012-12-03 (×9): qty 1

## 2012-12-03 MED ORDER — HEPARIN SODIUM (PORCINE) 5000 UNIT/ML IJ SOLN
5000.0000 [IU] | Freq: Three times a day (TID) | INTRAMUSCULAR | Status: DC
  Administered 2012-12-03 – 2012-12-11 (×25): 5000 [IU] via SUBCUTANEOUS
  Filled 2012-12-03 (×28): qty 1

## 2012-12-03 MED ORDER — ATORVASTATIN CALCIUM 80 MG PO TABS
80.0000 mg | ORAL_TABLET | ORAL | Status: DC
  Administered 2012-12-03 – 2012-12-11 (×5): 80 mg via ORAL
  Filled 2012-12-03 (×5): qty 1

## 2012-12-03 MED ORDER — INSULIN ASPART 100 UNIT/ML ~~LOC~~ SOLN
3.0000 [IU] | Freq: Three times a day (TID) | SUBCUTANEOUS | Status: DC
  Administered 2012-12-03 – 2012-12-06 (×12): 3 [IU] via SUBCUTANEOUS
  Administered 2012-12-07: 09:00:00 via SUBCUTANEOUS
  Administered 2012-12-07 – 2012-12-11 (×13): 3 [IU] via SUBCUTANEOUS

## 2012-12-03 MED ORDER — SPIRONOLACTONE 25 MG PO TABS
25.0000 mg | ORAL_TABLET | Freq: Every day | ORAL | Status: DC
  Administered 2012-12-03 – 2012-12-06 (×4): 25 mg via ORAL
  Filled 2012-12-03 (×5): qty 1

## 2012-12-03 MED ORDER — ASPIRIN 81 MG PO TABS: 81.0000 mg | ORAL_TABLET | Freq: Every day | ORAL | Status: DC

## 2012-12-03 MED ORDER — INSULIN ASPART 100 UNIT/ML ~~LOC~~ SOLN
0.0000 [IU] | Freq: Three times a day (TID) | SUBCUTANEOUS | Status: DC
  Administered 2012-12-03: 2 [IU] via SUBCUTANEOUS
  Administered 2012-12-03 – 2012-12-04 (×3): 3 [IU] via SUBCUTANEOUS
  Administered 2012-12-04: 17:00:00 5 [IU] via SUBCUTANEOUS
  Administered 2012-12-04 – 2012-12-05 (×3): 3 [IU] via SUBCUTANEOUS
  Administered 2012-12-05: 12:00:00 7 [IU] via SUBCUTANEOUS
  Administered 2012-12-06: 5 [IU] via SUBCUTANEOUS
  Administered 2012-12-06: 3 [IU] via SUBCUTANEOUS
  Administered 2012-12-06: 08:00:00 2 [IU] via SUBCUTANEOUS
  Administered 2012-12-07 (×2): 3 [IU] via SUBCUTANEOUS
  Administered 2012-12-07: 12:00:00 5 [IU] via SUBCUTANEOUS
  Administered 2012-12-08: 12:00:00 9 [IU] via SUBCUTANEOUS
  Administered 2012-12-08: 06:00:00 3 [IU] via SUBCUTANEOUS
  Administered 2012-12-08: 2 [IU] via SUBCUTANEOUS
  Administered 2012-12-09: 5 [IU] via SUBCUTANEOUS
  Administered 2012-12-09: 17:00:00 7 [IU] via SUBCUTANEOUS
  Administered 2012-12-09: 3 [IU] via SUBCUTANEOUS
  Administered 2012-12-10: 2 [IU] via SUBCUTANEOUS
  Administered 2012-12-10: 07:00:00 5 [IU] via SUBCUTANEOUS
  Administered 2012-12-10: 12:00:00 7 [IU] via SUBCUTANEOUS
  Administered 2012-12-11 (×2): 5 [IU] via SUBCUTANEOUS

## 2012-12-03 MED ORDER — BENAZEPRIL HCL 5 MG PO TABS
5.0000 mg | ORAL_TABLET | Freq: Every day | ORAL | Status: DC
  Administered 2012-12-03 – 2012-12-11 (×9): 5 mg via ORAL
  Filled 2012-12-03 (×9): qty 1

## 2012-12-03 MED ORDER — ONDANSETRON HCL 4 MG/2ML IJ SOLN: 4.0000 mg | Freq: Four times a day (QID) | INTRAMUSCULAR | Status: DC | PRN

## 2012-12-03 MED ORDER — FUROSEMIDE 10 MG/ML IJ SOLN
40.0000 mg | Freq: Two times a day (BID) | INTRAMUSCULAR | Status: DC
  Administered 2012-12-03 (×2): 40 mg via INTRAVENOUS
  Filled 2012-12-03 (×5): qty 4

## 2012-12-03 MED ORDER — FUROSEMIDE 10 MG/ML IJ SOLN
40.0000 mg | Freq: Once | INTRAMUSCULAR | Status: AC
  Administered 2012-12-03: 40 mg via INTRAVENOUS
  Filled 2012-12-03: qty 4

## 2012-12-03 NOTE — Progress Notes (Signed)
Utilization Review Completed.[name] T12/02/2012

## 2012-12-03 NOTE — H&P (Signed)
Cardiology History and Physical  PCP: [name], MD  Cardiologist: Dr. [name]  Electrophysiologist: Dr. Sherryl Manges   History of Present Illness (and review of medical records): [name] is a 57 y.o. male who presents for evaluation of swelling and weight gain. He is followed by Dr. Jens Som and Dr. Graciela Husbands with known history of coronary artery disease and ischemic cardiomyopathy as well as a prior ICD. The patient has had previous three-vessel stenting. His last Myoview was performed in July 2014 as below. His ejection fraction was 26%. He was last admitted 09/2012 with CHF exacerbation.  He was recently seen in follow up as for CHF with mild volume overload and EP as ICD is at [facility].  Medications were titrated and he is planned for EP procedure 12/15.  He states over the past week he has had increased swelling and nonproductive cough.  His weight on 11/07/2012 was 206, he is now 222.  He reports baseline shortness of breath, PND, and orthopnea.  He has not increased his lasix as directed for weight gain.  He denies any chest pain, syncope, palpitations or ICD discharge.  Previous diagnostic testing Device interrogation 10/2012- Battery at [facility] since 10/28/2012. RV lead (Riata) on recall. Otherwise normal device function. Thresholds and sensing consistent with previous device measurements. Impedance trends stable over time. No evidence of any ventricular arrhythmias. Histogram distribution right shifted and ECG today shows sinus tach. Coreg being up-titrated. No programming changes made this session. Device programmed at appropriate safety margins. Device programmed to optimize intrinsic conduction. Patient scheduled for ICD generator change and possible RV lead revision (will have RV lead fluoroscopy done) with Dr. Graciela Husbands.  Echo 09/2012 Study Conclusions - Left ventricle: The cavity size was mildly dilated. Wall thickness was normal. The estimated ejection fraction was 20%. Diffuse  hypokinesis with peri-apical akinesis. Definity contrast was used. No LV thrombus noted. Doppler parameters are consistent with restrictive physiology, indicative of decreased left ventricular diastolic compliance and/or increased left atrial pressure. - Aortic valve: There was no stenosis. - Mitral valve: Trivial regurgitation. - Left atrium: The atrium was mildly dilated. - Right ventricle: Poorly visualized. The cavity size was normal. Systolic function was mildly reduced. - Tricuspid valve: Peak RV-RA gradient: 29mm Hg (S). - Pulmonary arteries: PA peak pressure: 39mm Hg (S). - Systemic veins: IVC measured 2.4 cm with some respirophasic variation, suggesing RA pressure 10 mmHg.  Nuclear stress test 07/2012:  High risk stress nuclear study with large, severe, fixed anterior, septal, inferior and apical infarct; no ischemia. LV Ejection Fraction: 26%. LV Wall Motion: Global hypokinesis.  Review of Systems Further review of systems was otherwise negative other than stated in HPI.  Patient Active Problem List   Diagnosis Date Noted  . CHF (congestive heart failure) 12/03/2012  . Upper respiratory infection 10/01/2012  . Hypotension 10/01/2012  . Carotid stenosis 10/25/2011  . Hypertension 08/09/2010  . Automatic implantable cardioverter-defibrillator in St. Jude 08/09/2010  . CAD 01/21/2009  . AODM 05/15/2008  . TOBACCO ABUSE 05/15/2008  . CARDIOMYOPATHY, ISCHEMIC 05/15/2008  . CEREBROVASCULAR DISEASE 05/15/2008  . PVD 05/15/2008  . CHEST PAIN 05/15/2008  . HYPERLIPIDEMIA-MIXED 01/22/2008  . SYSTOLIC HEART FAILURE, CHRONIC 01/22/2008   Past Medical History  Diagnosis Date  . Hyperlipidemia     mixed  . Chronic systolic heart failure   . ICD (implantable cardiac defibrillator), dual, in situ     St. Jude for severe LVD EF 25% 2/07 explanted 2010. Medtronic Virtuoso II DR Dual-chamber cardiverter-defibrillator  with pocket revision, Dr. Graciela Husbands  . Tobacco abuse   . Ischemic  cardiomyopathy     a. Echo (09/27/12): EF 20%, diffuse HK with periapical AK, no LV thrombus noted, restrictive physiology, trivial MR, mild LAE, RVSF mildly reduced, PASP 39  . Cerebrovascular disease   . Diabetes mellitus   . HTN (hypertension)   . PVD (peripheral vascular disease)     a. ABI 09/2011 - R normal, L moderate - saw Dr. Kirke Corin - med rx.   . Carotid artery disease     a. Dopp 09/2011: 60-79% RICA, 40-59% LICA.;  b.  Carotid US (7/14):  Bilateral 60-79% => f/u 6 mos  . Coronary artery disease     a. AWMI requiring IABP 2005 s/p Horizon study stent-LAD, staged BMS-Cx, DESx2-RCA. b. Last Last LHC (6/06):  EF 25%, pLAD stent 40-50, after stent 40, dLAD 20, pCFX 20, pOM1 70, pRCA 20, mRCA stents ok.=> med Rx;  c.  Myoview (7/14):  EF 26%, large ant, septal, inf and apical infarct, no ischemia.  Med Rx continued    Past Surgical History  Procedure Laterality Date  . Medtronic virtuoso ii dr dual-chamber cardioverter-defibrillation with pocket revison      Dr. Sherryl Manges    Prescriptions prior to admission  Medication Sig Dispense Refill  . aspirin 81 MG tablet Take 1 tablet (81 mg total) by mouth daily.  30 tablet  3  . atorvastatin (LIPITOR) 80 MG tablet Take 80 mg by mouth every other day.       . benazepril (LOTENSIN) 5 MG tablet Take 5 mg by mouth daily.      . carvedilol (COREG) 12.5 MG tablet Take 1 tablet (12.5 mg total) by mouth 2 (two) times daily with a meal.  60 tablet  11  . ezetimibe (ZETIA) 10 MG tablet Take 10 mg by mouth daily.      . furosemide (LASIX) 40 MG tablet Take 1 tablet (40 mg total) by mouth daily. May take an additional 1 tablet daily as needed for weight gain of 2-3 lbs overnight.  60 tablet  3  . glimepiride (AMARYL) 4 MG tablet Take 4 mg by mouth daily before breakfast.      . metFORMIN (GLUCOPHAGE-XR) 500 MG 24 hr tablet Take 500 mg by mouth 2 (two) times daily.      . metolazone (ZAROXOLYN) 2.5 MG tablet Take 2.5 mg by mouth See admin instructions.  Take 2.5 mg daily for 2 consecutive days (two tablets per week) if needed for excess fluid when LASIX does not work.      . potassium chloride SA (K-DUR,KLOR-CON) 20 MEQ tablet Take 20 mEq by mouth See admin instructions. Take 20 meq for 2 consecutive days (TWO TABLETS PER WEEK WHEN TAKING METOLAZONE)       Allergies  Allergen Reactions  . Niacin Itching and Other (See Comments)    Flushing     History  Substance Use Topics  . Smoking status: Current Every Day Smoker -- 1.00 packs/day for 33 years    Types: Cigarettes  . Smokeless tobacco: Not on file  . Alcohol Use: No    Family History  Problem Relation Age of Onset  . Diabetes Mother   . Coronary artery disease Father      Objective:  Patient Vitals for the past 8 hrs:  BP Temp Temp src Pulse Resp SpO2 Weight  12/03/12 0346 116/77 mmHg 98.1 F (36.7 C) Oral 94 21 99 % 98.748 kg (217  lb 11.2 oz)  12/03/12 0049 - - - 96 22 98 % -  12/03/12 0048 106/79 mmHg - - 96 - 100 % -  12/02/12 2152 131/78 mmHg 97.7 F (36.5 C) - 96 20 97 % 100.925 kg (222 lb 8 oz)   General appearance: alert, cooperative, appears stated age and no distress Head: Normocephalic, without obvious abnormality, atraumatic Eyes: conjunctivae/corneas clear. PERRL, EOM's intact. Fundi benign. Neck: JVP elevated Lungs: decreased breath sounds Chest wall: no tenderness Heart: regular rate and rhythm, S1, S2 normal,  Abdomen: soft, mildy tender; bowel sounds normal; no masses,  no organomegaly Extremities: chronic venous statis changes, BLE edema Neurologic: Grossly normal  Results for orders placed during the hospital encounter of 12/03/12 (from the past 48 hour(s))  CBC WITH DIFFERENTIAL     Status: Abnormal   Collection Time    12/02/12  9:59 PM      Result Value Range   WBC 7.1  4.0 - 10.5 K/uL   RBC 4.72  4.22 - 5.81 MIL/uL   Hemoglobin 14.4  13.0 - 17.0 g/dL   HCT 16.1  09.6 - 04.5 %   MCV 90.5  78.0 - 100.0 fL   MCH 30.5  26.0 - 34.0 pg   MCHC  33.7  30.0 - 36.0 g/dL   RDW 40.9  81.1 - 91.4 %   Platelets 133 (*) 150 - 400 K/uL   Neutrophils Relative % 72  43 - 77 %   Neutro Abs 5.1  1.7 - 7.7 K/uL   Lymphocytes Relative 19  12 - 46 %   Lymphs Abs 1.4  0.7 - 4.0 K/uL   Monocytes Relative 7  3 - 12 %   Monocytes Absolute 0.5  0.1 - 1.0 K/uL   Eosinophils Relative 1  0 - 5 %   Eosinophils Absolute 0.1  0.0 - 0.7 K/uL   Basophils Relative 0  0 - 1 %   Basophils Absolute 0.0  0.0 - 0.1 K/uL  COMPREHENSIVE METABOLIC PANEL     Status: Abnormal   Collection Time    12/02/12  9:59 PM      Result Value Range   Sodium 137  135 - 145 mEq/L   Potassium 4.1  3.5 - 5.1 mEq/L   Chloride 98  96 - 112 mEq/L   CO2 28  19 - 32 mEq/L   Glucose, Bld 295 (*) 70 - 99 mg/dL   BUN 11  6 - 23 mg/dL   Creatinine, Ser 7.82  0.50 - 1.35 mg/dL   Calcium 9.1  8.4 - 95.6 mg/dL   Total Protein 6.6  6.0 - 8.3 g/dL   Albumin 3.3 (*) 3.5 - 5.2 g/dL   AST 9  0 - 37 U/L   ALT 17  0 - 53 U/L   Alkaline Phosphatase 99  39 - 117 U/L   Total Bilirubin 0.7  0.3 - 1.2 mg/dL   GFR calc non Af Amer >90  >90 mL/min   GFR calc Af Amer >90  >90 mL/min   Comment: (NOTE)     The eGFR has been calculated using the CKD EPI equation.     This calculation has not been validated in all clinical situations.     eGFR's persistently <90 mL/min signify possible Chronic Kidney     Disease.  PRO B NATRIURETIC PEPTIDE     Status: Abnormal   Collection Time    12/02/12  9:59 PM      Result  Value Range   Pro B Natriuretic peptide (BNP) 2717.0 (*) 0 - 125 pg/mL  POCT I-STAT TROPONIN I     Status: None   Collection Time    12/03/12  2:51 AM      Result Value Range   Troponin i, poc 0.01  0.00 - 0.08 ng/mL   Comment 3            Comment: Due to the release kinetics of cTnI,     a negative result within the first hours     of the onset of symptoms does not rule out     myocardial infarction with certainty.     If myocardial infarction is still suspected,     repeat the  test at appropriate intervals.   Dg Chest 2 View  12/02/2012   CLINICAL DATA:  Hypertension and congestive heart failure, lower extremity edema  EXAM: CHEST  2 VIEW  COMPARISON:  September 25, 2012  FINDINGS: Mild cardiac enlargement. Cardiac pacer in unchanged position. Mild vascular congestion. Small bilateral pleural effusions, similar to prior study. Hazy opacities at both lung bases also noted, which, when compared to the prior study, appear stable to slightly less prominent. Perihilar haziness has resolved.  IMPRESSION: Findings most consistent with mild chronic pulmonary edema. Bilateral mild lower lobe pulmonary edema and atelectasis with small bilateral pleural effusions.   Electronically Signed   By: Esperanza Heir M.D.   On: 12/02/2012 23:15    ECG:  Sinus rhythm HR 95, inferior and anterolateral q waves as seen on prior ecgs, no significant change from prior  ASSESSMENT AND PLAN:  1. Acute on Chronic Systolic CHF: Will admit for IV diuresis.  Lasix IV 40mg  BID, reassess response.  Strict I/Os, daily weights, 2gm Na diet. 1.5 liter fluid restriction. 2. Ischemic Cardiomyopathy: Continue current ACE inhibitor and  Coreg. 3. CAD: No angina. Continue aspirin, statin. 4. Hypertension: Meds as above. 5. Hyperlipidemia: Continue statin. 6. Diabetes Mellitus: Hold metformin, SSI. 7. S/p AICD: Follow up with EP as planned. Device at [facility],  RV lead (Riata) on recall 8. Tobacco Abuse:

## 2012-12-03 NOTE — ED Provider Notes (Signed)
CSN: [id number]     Arrival date & time 12/02/12  2133 History   First MD Initiated Contact with Patient 12/03/12 0138     Chief Complaint  Patient presents with  . Leg Swelling   (Consider location/radiation/quality/duration/timing/severity/associated sxs/prior Treatment) HPI 57 year old male presents to emergency room with complaint of bilateral lower extremity swelling, dry cough, fatigue, and shortness of breath on exertion.  Patient has history of congestive heart failure, ICD for ischemic cardiomyopathy.  He has an EF of 20%.  Patient denies missing any doses of his Lasix.  He has not been doubling his Lasix for increased weight as he has not been feeling well.  Patient reports symptoms started on Saturday.  He reports he spent most of the weekend in bed.  Due to fatigue and generalized nausea.  He denies any chest pain.  He reports is the biggest complaint is the persistent cough that he has had.  Patient feels that he has to cough until he almost throws up and is unable to bring anything up.  Patient is followed closely by his cardiologist, Dr. Jens Som.  He was seen roughly about a month ago, and had his Lasix titrated along with Zaroxolyn.  Patient admits that he has not been doubling his dose as instructed. Past Medical History  Diagnosis Date  . Hyperlipidemia     mixed  . Chronic systolic heart failure   . ICD (implantable cardiac defibrillator), dual, in situ     St. Jude for severe LVD EF 25% 2/07 explanted 2010. Medtronic Virtuoso II DR Dual-chamber cardiverter-defibrillator  with pocket revision, Dr. Graciela Husbands  . Tobacco abuse   . Ischemic cardiomyopathy     a. Echo (09/27/12): EF 20%, diffuse HK with periapical AK, no LV thrombus noted, restrictive physiology, trivial MR, mild LAE, RVSF mildly reduced, PASP 39  . Cerebrovascular disease   . Diabetes mellitus   . HTN (hypertension)   . PVD (peripheral vascular disease)     a. ABI 09/2011 - R normal, L moderate - saw Dr. Kirke Corin - med  rx.   . Carotid artery disease     a. Dopp 09/2011: 60-79% RICA, 40-59% LICA.;  b.  Carotid US (7/14):  Bilateral 60-79% => f/u 6 mos  . Coronary artery disease     a. AWMI requiring IABP 2005 s/p Horizon study stent-LAD, staged BMS-Cx, DESx2-RCA. b. Last Last LHC (6/06):  EF 25%, pLAD stent 40-50, after stent 40, dLAD 20, pCFX 20, pOM1 70, pRCA 20, mRCA stents ok.=> med Rx;  c.  Myoview (7/14):  EF 26%, large ant, septal, inf and apical infarct, no ischemia.  Med Rx continued   Past Surgical History  Procedure Laterality Date  . Medtronic virtuoso ii dr dual-chamber cardioverter-defibrillation with pocket revison      Dr. Sherryl Manges   Family History  Problem Relation Age of Onset  . Diabetes Mother   . Coronary artery disease Father    History  Substance Use Topics  . Smoking status: Current Every Day Smoker -- 1.00 packs/day for 33 years    Types: Cigarettes  . Smokeless tobacco: Not on file  . Alcohol Use: No    Review of Systems  All other systems reviewed and are negative.    Allergies  Niacin  Home Medications   Current Outpatient Rx  Name  Route  Sig  Dispense  Refill  . aspirin 81 MG tablet   Oral   Take 1 tablet (81 mg total) by mouth daily.  30 tablet   3   . atorvastatin (LIPITOR) 80 MG tablet   Oral   Take 80 mg by mouth every other day.          . benazepril (LOTENSIN) 5 MG tablet   Oral   Take 5 mg by mouth daily.         . carvedilol (COREG) 12.5 MG tablet   Oral   Take 1 tablet (12.5 mg total) by mouth 2 (two) times daily with a meal.   60 tablet   11   . ezetimibe (ZETIA) 10 MG tablet   Oral   Take 10 mg by mouth daily.         . furosemide (LASIX) 40 MG tablet   Oral   Take 1 tablet (40 mg total) by mouth daily. May take an additional 1 tablet daily as needed for weight gain of 2-3 lbs overnight.   60 tablet   3   . glimepiride (AMARYL) 4 MG tablet   Oral   Take 4 mg by mouth daily before breakfast.         . metFORMIN  (GLUCOPHAGE-XR) 500 MG 24 hr tablet   Oral   Take 500 mg by mouth 2 (two) times daily.         . metolazone (ZAROXOLYN) 2.5 MG tablet   Oral   Take 2.5 mg by mouth See admin instructions. Take 2.5 mg daily for 2 consecutive days (two tablets per week) if needed for excess fluid when LASIX does not work.         . potassium chloride SA (K-DUR,KLOR-CON) 20 MEQ tablet   Oral   Take 20 mEq by mouth See admin instructions. Take 20 meq for 2 consecutive days (TWO TABLETS PER WEEK WHEN TAKING METOLAZONE)          BP 106/79  Pulse 96  Temp(Src) 97.7 F (36.5 C)  Resp 22  Wt 222 lb 8 oz (100.925 kg)  SpO2 98% Physical Exam  Nursing note and vitals reviewed. Constitutional: He is oriented to person, place, and time. He appears well-developed and well-nourished. No distress.  HENT:  Head: Normocephalic and atraumatic.  Nose: Nose normal.  Mouth/Throat: Oropharynx is clear and moist.  Eyes: Conjunctivae and EOM are normal. Pupils are equal, round, and reactive to light.  Neck: Normal range of motion. Neck supple. No JVD present. No tracheal deviation present. No thyromegaly present.  Cardiovascular: Normal rate, regular rhythm, normal heart sounds and intact distal pulses.  Exam reveals no gallop and no friction rub.   No murmur heard. Pulmonary/Chest: Effort normal. No stridor. No respiratory distress. He has no wheezes. He has rales. He exhibits no tenderness.  Decreased lung sounds throughout  Abdominal: Soft. Bowel sounds are normal. He exhibits no distension and no mass. There is no tenderness. There is no rebound and no guarding.  Musculoskeletal: Normal range of motion. He exhibits edema (2+ pitting edema). He exhibits no tenderness.  Lymphadenopathy:    He has no cervical adenopathy.  Neurological: He is alert and oriented to person, place, and time. No cranial nerve deficit. He exhibits normal muscle tone. Coordination normal.  Skin: Skin is warm and dry. No rash noted. No  erythema. No pallor.  Psychiatric: He has a normal mood and affect. His behavior is normal. Judgment and thought content normal.    ED Course  Procedures (including critical care time) Labs Review Labs Reviewed  CBC WITH DIFFERENTIAL - Abnormal; Notable for the following:  Platelets 133 (*)    All other components within normal limits  COMPREHENSIVE METABOLIC PANEL - Abnormal; Notable for the following:    Glucose, Bld 295 (*)    Albumin 3.3 (*)    All other components within normal limits  PRO B NATRIURETIC PEPTIDE - Abnormal; Notable for the following:    Pro B Natriuretic peptide (BNP) 2717.0 (*)    All other components within normal limits   Imaging Review Dg Chest 2 View  12/02/2012   CLINICAL DATA:  Hypertension and congestive heart failure, lower extremity edema  EXAM: CHEST  2 VIEW  COMPARISON:  September 25, 2012  FINDINGS: Mild cardiac enlargement. Cardiac pacer in unchanged position. Mild vascular congestion. Small bilateral pleural effusions, similar to prior study. Hazy opacities at both lung bases also noted, which, when compared to the prior study, appear stable to slightly less prominent. Perihilar haziness has resolved.  IMPRESSION: Findings most consistent with mild chronic pulmonary edema. Bilateral mild lower lobe pulmonary edema and atelectasis with small bilateral pleural effusions.   Electronically Signed   By: Esperanza Heir M.D.   On: 12/02/2012 23:15    EKG Interpretation    Date/Time:  Monday December 02 2012 22:26:13 EST Ventricular Rate:  95 PR Interval:  150 QRS Duration: 110 QT Interval:  356 QTC Calculation: 447 R Axis:   80 Text Interpretation:  Normal sinus rhythm Possible Left atrial enlargement Inferior infarct , age undetermined Anterolateral infarct , age undetermined Abnormal ECG Confirmed by Evita Merida  MD, Zaryiah Barz ([id number]) on 12/03/2012 1:48:09 AM            MDM   1. CHF (congestive heart failure), acute on chronic, combined     57 year old male with acute on chronic congestive heart failure.  During my evaluation, patient had transitory drops in his oxygen saturation with exertion.  Will discuss with cardiology for admission.    Olivia Mackie, MD 12/03/12 [phone]

## 2012-12-03 NOTE — Progress Notes (Signed)
Inpatient Diabetes Program Recommendations  AACE/ADA: New Consensus Statement on Inpatient Glycemic Control (2013)  Target Ranges:  Prepandial:   less than 140 mg/dL      Peak postprandial:   less than 180 mg/dL (1-2 hours)      Critically ill patients:  140 - 180 mg/dL   Reason for Assessment: Elevated glucose  Inpatient Diabetes Program Recommendations HgbA1C: No known Hbg A1C.  Request order.  Thank you.  Choya Tornow S. Elsie Lincoln, RN, CNS, CDE Inpatient Diabetes Program, team pager [phone]

## 2012-12-03 NOTE — Progress Notes (Signed)
12/03/12 0330 Patient A/Ox4 and is ambulatory with 1 person assist. He was transported by stretcher to the unit on 2 L [place] of oxygen. He had no signs of distress and no c/o pain.

## 2012-12-03 NOTE — Progress Notes (Signed)
As previous note; patient with ICM admitted with CHF symptoms; continue lasix and follow renal function; add spironolactone; will have patient seen in CHF clinic following DC. Will review with EP timing of lead revision. [name]

## 2012-12-04 DIAGNOSIS — I509 Heart failure, unspecified: Secondary | ICD-10-CM

## 2012-12-04 DIAGNOSIS — I5043 Acute on chronic combined systolic (congestive) and diastolic (congestive) heart failure: Secondary | ICD-10-CM

## 2012-12-04 LAB — GLUCOSE, CAPILLARY
Glucose-Capillary: 233 mg/dL — ABNORMAL HIGH (ref 70–99)
Glucose-Capillary: 268 mg/dL — ABNORMAL HIGH (ref 70–99)

## 2012-12-04 LAB — BASIC METABOLIC PANEL
CO2: 27 mEq/L (ref 19–32)
Calcium: 9 mg/dL (ref 8.4–10.5)
Chloride: 99 mEq/L (ref 96–112)
Creatinine, Ser: 0.78 mg/dL (ref 0.50–1.35)
GFR calc Af Amer: >90 mL/min (ref >90)
GFR calc non Af Amer: >90 mL/min (ref >90)

## 2012-12-04 MED ORDER — FUROSEMIDE 10 MG/ML IJ SOLN
80.0000 mg | Freq: Two times a day (BID) | INTRAMUSCULAR | Status: DC
  Administered 2012-12-04 – 2012-12-07 (×7): 80 mg via INTRAVENOUS
  Filled 2012-12-04 (×8): qty 8

## 2012-12-04 NOTE — Progress Notes (Signed)
Inpatient Diabetes Program Recommendations  AACE/ADA: New Consensus Statement on Inpatient Glycemic Control (2013)  Target Ranges:  Prepandial:   less than 140 mg/dL      Peak postprandial:   less than 180 mg/dL (1-2 hours)      Critically ill patients:  140 - 180 mg/dL   Fasting hyperglycemia  Inpatient Diabetes Program Recommendations Insulin - Basal: Please consider addition of Lantus 10-15 units  to insulin regimen while here. HgbA1C: No known Hbg A1C.  Request order.  Thank you, Lenor Coffin, RN, CNS, Diabetes Coordinator [phone])

## 2012-12-04 NOTE — Progress Notes (Signed)
    Subjective:  Denies CP; dyspnea mildly improved; general malaise   Objective:  Filed Vitals:   12/03/12 2149 12/04/12 0126 12/04/12 0625 12/04/12 0629  BP: 89/64 100/71 104/69   Pulse: 84 87 88   Temp: 98.6 F (37 C) 98.2 F (36.8 C) 97.9 F (36.6 C)   TempSrc: Oral Oral Oral   Resp: 16 18 18    Weight:    219 lb 4.8 oz (99.474 kg)  SpO2: 98% 97% 97%     Intake/Output from previous day:  Intake/Output Summary (Last 24 hours) at 12/04/12 0718 Last data filed at 12/04/12 1610  Gross per 24 hour  Intake    963 ml  Output   1400 ml  Net   -437 ml    Physical Exam: Physical exam: Well-developed well-nourished in no acute distress.  Skin is warm and dry.  HEENT is normal.  Neck is supple.  Chest diminished BS; mild exp wheeze Cardiovascular exam is regular rate and rhythm.  Abdominal exam nontender or distended. No masses palpated. Extremities show 1+ edema. neuro grossly intact    Lab Results: Basic Metabolic Panel:  Recent Labs  96/04/54 2159 12/03/12 0550  NA 137  --   K 4.1  --   CL 98  --   CO2 28  --   GLUCOSE 295*  --   BUN 11  --   CREATININE 0.80  --   CALCIUM 9.1  --   MG  --  1.7   CBC:  Recent Labs  12/02/12 2159  WBC 7.1  NEUTROABS 5.1  HGB 14.4  HCT 42.7  MCV 90.5  PLT 133*     Assessment/Plan:  1 Acute on Chronic Systolic CHF: Patient continues with volume excess; change lasix to 80 mg IV BID and follow renal function and K. Strict I/Os, daily weights, 2gm Na diet. 1.5 liter fluid restriction. Plan outpatient eval following DC in CHF clinic. 2 Ischemic Cardiomyopathy: Continue current ACE inhibitor and Coreg.  3 CAD: No angina. Continue aspirin, statin.  4 Hypertension: BP controllled. 5 Hyperlipidemia: Continue statin.  6 Diabetes Mellitus: Hold metformin, SSI. Follow CBG 7 S/p AICD: Follow up with EP as planned. Device at [facility], RV lead (Riata) on recall. Discussed with SK yesterday. He will fu as scheduled for device  revision. 8 Tobacco Abuse: Patient counseled on DCing.   [name] 12/04/2012, 7:18 AM

## 2012-12-05 LAB — GLUCOSE, CAPILLARY
Glucose-Capillary: 239 mg/dL — ABNORMAL HIGH (ref 70–99)
Glucose-Capillary: 319 mg/dL — ABNORMAL HIGH (ref 70–99)
Glucose-Capillary: 211 mg/dL — ABNORMAL HIGH (ref 70–99)
Glucose-Capillary: 178 mg/dL — ABNORMAL HIGH (ref 70–99)

## 2012-12-05 LAB — BASIC METABOLIC PANEL
BUN: 18 mg/dL (ref 6–23)
CO2: 31 mEq/L (ref 19–32)
Calcium: 8.8 mg/dL (ref 8.4–10.5)
GFR calc Af Amer: >90 mL/min (ref >90)
GFR calc non Af Amer: >90 mL/min (ref >90)
Glucose, Bld: 238 mg/dL — ABNORMAL HIGH (ref 70–99)
Potassium: 4.6 mEq/L (ref 3.5–5.1)
Sodium: 133 mEq/L — ABNORMAL LOW (ref 135–145)

## 2012-12-05 NOTE — Progress Notes (Signed)
    Subjective:  Denies CP; dyspnea mildly improved   Objective:  Filed Vitals:   12/04/12 1300 12/04/12 2147 12/05/12 0455 12/05/12 0626  BP: 98/61 86/60 97/70  102/71  Pulse: 84 83 80 86  Temp: 97.8 F (36.6 C) 98.1 F (36.7 C) 97.7 F (36.5 C)   TempSrc: Oral Oral Oral   Resp: 20 18 18    Weight:   218 lb 8 oz (99.111 kg)   SpO2: 100% 97% 100%     Intake/Output from previous day:  Intake/Output Summary (Last 24 hours) at 12/05/12 1610 Last data filed at 12/05/12 0456  Gross per 24 hour  Intake    363 ml  Output   2250 ml  Net  -1887 ml    Physical Exam: Physical exam: Well-developed well-nourished in no acute distress.  Skin is warm and dry.  HEENT is normal.  Neck is supple.  Chest diminished BS; mild exp wheeze Cardiovascular exam is regular rate and rhythm.  Abdominal exam nontender or distended. No masses palpated. Extremities show 1+ edema. neuro grossly intact    Lab Results: Basic Metabolic Panel:  Recent Labs  96/04/54 2159 12/03/12 0550 12/04/12 0545  NA 137  --  138  K 4.1  --  4.3  CL 98  --  99  CO2 28  --  27  GLUCOSE 295*  --  239*  BUN 11  --  19  CREATININE 0.80  --  0.78  CALCIUM 9.1  --  9.0  MG  --  1.7  --    CBC:  Recent Labs  12/02/12 2159  WBC 7.1  NEUTROABS 5.1  HGB 14.4  HCT 42.7  MCV 90.5  PLT 133*     Assessment/Plan:  1 Acute on Chronic Systolic CHF: Patient continues with volume excess; continue lasix 80 mg IV BID and follow renal function and K. Better diuresis yesterday. Strict I/Os, daily weights, 2gm Na diet. 1.5 liter fluid restriction. Plan outpatient eval following DC in CHF clinic. 2 Ischemic Cardiomyopathy: Continue current ACE inhibitor and Coreg.  3 CAD: No angina. Continue aspirin, statin.  4 Hypertension: BP controllled. 5 Hyperlipidemia: Continue statin.  6 Diabetes Mellitus: Hold metformin, SSI. Follow CBG 7 S/p AICD: Follow up with EP as planned. Device at [facility], RV lead (Riata) on recall.  Discussed with SK. He will fu as scheduled for device revision. 8 Tobacco Abuse: Patient counseled on DCing.   [name] 12/05/2012, 6:33 AM

## 2012-12-05 NOTE — Progress Notes (Signed)
CBGs on 12/3: [phone] mg/dl       16/1: 096/045 mg/dl  Recommend starting a low dose of basal insulin while in the hospital: Lantus 10-15 units daily if CBGs continue greater than 180 mg/dl.  CBGs have continually been above 200 mg/dl with Novolog correction scale and meal coverage.   Will continue to follow while in hospital.  Smith Mince RN BSN CDE

## 2012-12-06 LAB — BASIC METABOLIC PANEL
BUN: 17 mg/dL (ref 6–23)
Chloride: 96 mEq/L (ref 96–112)
GFR calc Af Amer: >90 mL/min (ref >90)
Potassium: 4.9 mEq/L (ref 3.5–5.1)
Sodium: 135 mEq/L (ref 135–145)

## 2012-12-06 LAB — GLUCOSE, CAPILLARY
Glucose-Capillary: 286 mg/dL — ABNORMAL HIGH (ref 70–99)
Glucose-Capillary: 253 mg/dL — ABNORMAL HIGH (ref 70–99)

## 2012-12-06 MED ORDER — CARVEDILOL 6.25 MG PO TABS
6.2500 mg | ORAL_TABLET | Freq: Two times a day (BID) | ORAL | Status: DC
  Administered 2012-12-06 – 2012-12-07 (×2): 6.25 mg via ORAL
  Filled 2012-12-06 (×4): qty 1

## 2012-12-06 NOTE — Progress Notes (Signed)
    Subjective:  Denies CP; dyspnea improving   Objective:  Filed Vitals:   12/05/12 0910 12/05/12 1445 12/05/12 1955 12/06/12 0442  BP: 108/78 98/68 94/63  84/53  Pulse: 86 82 85 79  Temp:  97.1 F (36.2 C) 98.3 F (36.8 C) 97.8 F (36.6 C)  TempSrc:  Oral Oral Oral  Resp:  18 18 18   Weight:    218 lb 3.2 oz (98.975 kg)  SpO2:  100% 96% 100%    Intake/Output from previous day:  Intake/Output Summary (Last 24 hours) at 12/06/12 8119 Last data filed at 12/06/12 0100  Gross per 24 hour  Intake    960 ml  Output   2350 ml  Net  -1390 ml    Physical Exam: Physical exam: Well-developed well-nourished in no acute distress.  Skin is warm and dry.  HEENT is normal.  Neck is supple.  Chest diminished BS; mild exp wheeze Cardiovascular exam is regular rate and rhythm.  Abdominal exam nontender or distended. No masses palpated. Extremities show 1+ edema. neuro grossly intact    Lab Results: Basic Metabolic Panel:  Recent Labs  14/78/29 0545 12/05/12 0540  NA 138 133*  K 4.3 4.6  CL 99 94*  CO2 27 31  GLUCOSE 239* 238*  BUN 19 18  CREATININE 0.78 0.81  CALCIUM 9.0 8.8     Assessment/Plan:  1 Acute on Chronic Systolic CHF: Patient continues with volume excess but improving; continue lasix 80 mg IV BID and follow renal function and K. Strict I/Os, daily weights, 2gm Na diet. 1.5 liter fluid restriction. Plan outpatient eval following DC in CHF clinic. 2 Ischemic Cardiomyopathy: Continue current ACE inhibitor and Coreg; decrease coreg to 6.25 BID as BP borderline.  3 CAD: No angina. Continue aspirin, statin.  4 Hypertension: BP low; decrease coreg. 5 Hyperlipidemia: Continue statin.  6 Diabetes Mellitus: Hold metformin, SSI. Follow CBG 7 S/p AICD: Follow up with EP as planned. Device at [facility], RV lead (Riata) on recall. Discussed with SK. He will fu as scheduled for device revision. 8 Tobacco Abuse: Patient counseled on DCing.   [name] 12/06/2012, 7:17  AM

## 2012-12-07 ENCOUNTER — Encounter (HOSPITAL_COMMUNITY): Payer: Self-pay | Admitting: Internal Medicine

## 2012-12-07 DIAGNOSIS — E873 Alkalosis: Secondary | ICD-10-CM

## 2012-12-07 LAB — GLUCOSE, CAPILLARY
Glucose-Capillary: 223 mg/dL — ABNORMAL HIGH (ref 70–99)
Glucose-Capillary: 245 mg/dL — ABNORMAL HIGH (ref 70–99)

## 2012-12-07 LAB — BASIC METABOLIC PANEL
BUN: 16 mg/dL (ref 6–23)
Calcium: 8.7 mg/dL (ref 8.4–10.5)
Creatinine, Ser: 0.89 mg/dL (ref 0.50–1.35)
GFR calc Af Amer: >90 mL/min (ref >90)
GFR calc non Af Amer: >90 mL/min (ref >90)
Sodium: 136 mEq/L (ref 135–145)

## 2012-12-07 MED ORDER — FUROSEMIDE 10 MG/ML IJ SOLN
80.0000 mg | Freq: Two times a day (BID) | INTRAMUSCULAR | Status: DC
  Administered 2012-12-07 – 2012-12-10 (×7): 80 mg via INTRAVENOUS
  Filled 2012-12-07 (×9): qty 8

## 2012-12-07 MED ORDER — POTASSIUM CHLORIDE CRYS ER 20 MEQ PO TBCR
40.0000 meq | EXTENDED_RELEASE_TABLET | Freq: Two times a day (BID) | ORAL | Status: AC
  Administered 2012-12-07 (×2): 40 meq via ORAL
  Filled 2012-12-07 (×2): qty 2

## 2012-12-07 MED ORDER — SPIRONOLACTONE 12.5 MG HALF TABLET
12.5000 mg | ORAL_TABLET | Freq: Every day | ORAL | Status: DC
  Administered 2012-12-07 – 2012-12-11 (×5): 12.5 mg via ORAL
  Filled 2012-12-07 (×5): qty 1

## 2012-12-07 MED ORDER — TORSEMIDE 20 MG/2ML IV SOLN
40.0000 mg | Freq: Two times a day (BID) | INTRAVENOUS | Status: DC
  Filled 2012-12-07 (×3): qty 4

## 2012-12-07 MED ORDER — CARVEDILOL 3.125 MG PO TABS
3.1250 mg | ORAL_TABLET | Freq: Two times a day (BID) | ORAL | Status: DC
  Administered 2012-12-07 – 2012-12-10 (×7): 3.125 mg via ORAL
  Filled 2012-12-07 (×10): qty 1

## 2012-12-07 NOTE — Progress Notes (Signed)
Ambulates in hallway without difficulty.  No c/o chest pain.

## 2012-12-07 NOTE — Progress Notes (Signed)
S.kr       Patient Name: [name]      SUBJECTIVE:57 known CAD 3V PCI with EF 20-30% admitted for recurrence of A/C HFrEF Rx with diuresis  Prev ICD at [facility] with change out scheduled in two weeks  Riata l ead  Past Medical History  Diagnosis Date  . Hyperlipidemia     mixed  . Chronic systolic heart failure   . ICD (implantable cardiac defibrillator), dual, in situ     St. Jude for severe LVD EF 25% 2/07 explanted 2010. Medtronic Virtuoso II DR Dual-chamber cardiverter-defibrillator  with pocket revision, Dr. Graciela Husbands  . Tobacco abuse   . Ischemic cardiomyopathy     a. Echo (09/27/12): EF 20%, diffuse HK with periapical AK, no LV thrombus noted, restrictive physiology, trivial MR, mild LAE, RVSF mildly reduced, PASP 39  . Cerebrovascular disease   . Diabetes mellitus   . HTN (hypertension)   . PVD (peripheral vascular disease)     a. ABI 09/2011 - R normal, L moderate - saw Dr. Kirke Corin - med rx.   . Carotid artery disease     a. Dopp 09/2011: 60-79% RICA, 40-59% LICA.;  b.  Carotid US (7/14):  Bilateral 60-79% => f/u 6 mos  . Coronary artery disease     a. AWMI requiring IABP 2005 s/p Horizon study stent-LAD, staged BMS-Cx, DESx2-RCA. b. Last Last LHC (6/06):  EF 25%, pLAD stent 40-50, after stent 40, dLAD 20, pCFX 20, pOM1 70, pRCA 20, mRCA stents ok.=> med Rx;  c.  Myoview (7/14):  EF 26%, large ant, septal, inf and apical infarct, no ischemia.  Med Rx continued  . RIATA ICD Lead--on advisory recall      Scheduled Meds:  Scheduled Meds: . aspirin EC  81 mg Oral Daily  . atorvastatin  80 mg Oral QODAY  . benazepril  5 mg Oral Daily  . carvedilol  6.25 mg Oral BID WC  . ezetimibe  10 mg Oral Daily  . furosemide  80 mg Intravenous BID  . heparin  5,000 Units Subcutaneous Q8H  . insulin aspart  0-9 Units Subcutaneous TID WC  . insulin aspart  3 Units Subcutaneous TID WC  . sodium chloride  3 mL Intravenous Q12H  . spironolactone  25 mg Oral Daily   Continuous Infusions:     PHYSICAL EXAM Filed Vitals:   12/06/12 1917 12/06/12 2100 12/07/12 0553 12/07/12 0814  BP:  92/55 96/58   Pulse:  84 81   Temp:  98 F (36.7 C) 97.1 F (36.2 C)   TempSrc:  Oral Oral   Resp:  18 19   Height: 6' (1.829 m)     Weight:    214 lb 4.8 oz (97.206 kg)  SpO2:  98% 93%    Wt down 3.5 Lbs and I/Os-5.5L  General appearance: alert, cooperative and no distress Neck: JVD - 10 cm above sternal notch, no adenopathy, no carotid bruit, no JVD, supple, symmetrical, trachea midline and thyroid not enlarged, symmetric, no tenderness/mass/nodules Lungs: clear to auscultation bilaterally Heart: regular rate and rhythm, S1, S2 normal, no murmur, click, rub or gallop Extremities: edema 2+ Skin: Skin color, texture, turgor normal. No rashes or lesions Neurologic: Alert and oriented X 3, normal strength and tone. Normal symmetric reflexes. Normal coordination and gait    TELEMETRY: Reviewed telemetry pt in nsr with rapid rates:    Intake/Output Summary (Last 24 hours) at 12/07/12 0916 Last data filed at 12/07/12 2130  Gross  per 24 hour  Intake    723 ml  Output   1900 ml  Net  -1177 ml    LABS: Basic Metabolic Panel:  Recent Labs Lab 12/02/12 2159 12/03/12 0550 12/04/12 0545 12/05/12 0540 12/06/12 0626 12/07/12 0410  NA 137  --  138 133* 135 136  K 4.1  --  4.3 4.6 4.9 4.2  CL 98  --  99 94* 96 96  CO2 28  --  27 31 32 33*  GLUCOSE 295*  --  239* 238* 207* 252*  BUN 11  --  19 18 17 16   CREATININE 0.80  --  0.78 0.81 0.80 0.89  CALCIUM 9.1  --  9.0 8.8 9.1 8.7  MG  --  1.7  --   --   --   --    Cardiac Enzymes: No results found for this basename: CKTOTAL, CKMB, CKMBINDEX, TROPONINI,  in the last 72 hours CBC:  Recent Labs Lab 12/02/12 2159  WBC 7.1  NEUTROABS 5.1  HGB 14.4  HCT 42.7  MCV 90.5  PLT 133*   PROTIME: No results found for this basename: LABPROT, INR,  in the last 72 hours Liver Function Tests: No results found for this basename: AST,  ALT, ALKPHOS, BILITOT, PROT, ALBUMIN,  in the last 72 hours No results found for this basename: LIPASE, AMYLASE,  in the last 72 hours BNP: BNP (last 3 results)  Recent Labs  09/26/12 0113 10/29/12 1002 12/02/12 2159  PROBNP 1366.0* 543.0* 2717.0*    ASSESSMENT AND PLAN:  Principal Problem:   Acute on chronic systolic heart failure Active Problems:   CARDIOMYOPATHY, ISCHEMIC   Automatic implantable cardioverter-defibrillator in St. Jude   AODM   TOBACCO ABUSE   Metabolic alkalosis  Repelete Cl Push diuresis and decrease afterload so as to not excessively reduce BP  May need ionotropic support to diurese May have DB see in am   Signed, Sherryl Manges MD  12/07/2012

## 2012-12-08 LAB — GLUCOSE, CAPILLARY
Glucose-Capillary: 238 mg/dL — ABNORMAL HIGH (ref 70–99)
Glucose-Capillary: 202 mg/dL — ABNORMAL HIGH (ref 70–99)

## 2012-12-08 MED ORDER — POTASSIUM CHLORIDE CRYS ER 20 MEQ PO TBCR
40.0000 meq | EXTENDED_RELEASE_TABLET | ORAL | Status: AC
  Administered 2012-12-08 (×4): 40 meq via ORAL
  Filled 2012-12-08 (×3): qty 2

## 2012-12-08 NOTE — Progress Notes (Signed)
Ambulated in hallway, tolerated well.  C/o of left flank discomfort, did not think severe enough per patient for pain med, although offered.

## 2012-12-08 NOTE — Progress Notes (Signed)
S.kr       Patient Name: [name]      SUBJECTIVE:57 known CAD 3V PCI with EF 20-30% admitted for recurrence of A/C HFrEF Rx with diuresis  Prev ICD at [facility] with change out scheduled in two weeks  Riata lead in place with normal electrical function \ Vigorous diuresis yesterday (-4L) and feels some better  Past Medical History  Diagnosis Date  . Hyperlipidemia     mixed  . Chronic systolic heart failure   . ICD (implantable cardiac defibrillator), dual, in situ     St. Jude for severe LVD EF 25% 2/07 explanted 2010. Medtronic Virtuoso II DR Dual-chamber cardiverter-defibrillator  with pocket revision, Dr. Graciela Husbands  . Tobacco abuse   . Ischemic cardiomyopathy     a. Echo (09/27/12): EF 20%, diffuse HK with periapical AK, no LV thrombus noted, restrictive physiology, trivial MR, mild LAE, RVSF mildly reduced, PASP 39  . Cerebrovascular disease   . Diabetes mellitus   . HTN (hypertension)   . PVD (peripheral vascular disease)     a. ABI 09/2011 - R normal, L moderate - saw Dr. Kirke Corin - med rx.   . Carotid artery disease     a. Dopp 09/2011: 60-79% RICA, 40-59% LICA.;  b.  Carotid US (7/14):  Bilateral 60-79% => f/u 6 mos  . Coronary artery disease     a. AWMI requiring IABP 2005 s/p Horizon study stent-LAD, staged BMS-Cx, DESx2-RCA. b. Last Last LHC (6/06):  EF 25%, pLAD stent 40-50, after stent 40, dLAD 20, pCFX 20, pOM1 70, pRCA 20, mRCA stents ok.=> med Rx;  c.  Myoview (7/14):  EF 26%, large ant, septal, inf and apical infarct, no ischemia.  Med Rx continued  . RIATA ICD Lead--on advisory recall      Scheduled Meds:  Scheduled Meds: . aspirin EC  81 mg Oral Daily  . atorvastatin  80 mg Oral QODAY  . benazepril  5 mg Oral Daily  . carvedilol  3.125 mg Oral BID WC  . ezetimibe  10 mg Oral Daily  . furosemide  80 mg Intravenous BID  . heparin  5,000 Units Subcutaneous Q8H  . insulin aspart  0-9 Units Subcutaneous TID WC  . insulin aspart  3 Units Subcutaneous TID WC  .  sodium chloride  3 mL Intravenous Q12H  . spironolactone  12.5 mg Oral Daily   Continuous Infusions:   PHYSICAL EXAM Filed Vitals:   12/07/12 1300 12/07/12 2115 12/08/12 0237 12/08/12 0508  BP: 105/74 116/77 92/50 100/68  Pulse: 88 82 86 85  Temp: 97.5 F (36.4 C) 97.8 F (36.6 C)    TempSrc: Oral Oral  Oral  Resp: 20 18  16   Height:      Weight:    210 lb 4.8 oz (95.391 kg)  SpO2: 100% 96%  96%   Wt down 7.0 Lbs and I/Os-9L  General appearance: alert, cooperative and no distress Neck: JVD - 8-10 cm above sternal notch, no adenopathy, no carotid bruit, no JVD, supple, symmetrical, trachea midline and thyroid not enlarged, symmetric, no tenderness/mass/nodules Lungs: clear to auscultation bilaterally Heart: regular rate and rhythm, S1, S2 normal, no murmur, click, rub or gallop Extremities: edema 1+ Skin: Skin color, texture, turgor normal. No rashes or lesions Neurologic: Alert and oriented X 3, normal strength and tone. Normal symmetric reflexes. Normal coordination and gait    TELEMETRY: Reviewed telemetry pt in nsr with rapid rates:    Intake/Output Summary (Last 24 hours)  at 12/08/12 0941 Last data filed at 12/08/12 0900  Gross per 24 hour  Intake    720 ml  Output   4875 ml  Net  -4155 ml    LABS: Basic Metabolic Panel:  Recent Labs Lab 12/02/12 2159 12/03/12 0550 12/04/12 0545 12/05/12 0540 12/06/12 0626 12/07/12 0410  NA 137  --  138 133* 135 136  K 4.1  --  4.3 4.6 4.9 4.2  CL 98  --  99 94* 96 96  CO2 28  --  27 31 32 33*  GLUCOSE 295*  --  239* 238* 207* 252*  BUN 11  --  19 18 17 16   CREATININE 0.80  --  0.78 0.81 0.80 0.89  CALCIUM 9.1  --  9.0 8.8 9.1 8.7  MG  --  1.7  --   --   --   --    Cardiac Enzymes: No results found for this basename: CKTOTAL, CKMB, CKMBINDEX, TROPONINI,  in the last 72 hours CBC:  Recent Labs Lab 12/02/12 2159  WBC 7.1  NEUTROABS 5.1  HGB 14.4  HCT 42.7  MCV 90.5  PLT 133*   PROTIME: No results found  for this basename: LABPROT, INR,  in the last 72 hours Liver Function Tests: No results found for this basename: AST, ALT, ALKPHOS, BILITOT, PROT, ALBUMIN,  in the last 72 hours No results found for this basename: LIPASE, AMYLASE,  in the last 72 hours BNP: BNP (last 3 results)  Recent Labs  09/26/12 0113 10/29/12 1002 12/02/12 2159  PROBNP 1366.0* 543.0* 2717.0*    ASSESSMENT AND PLAN:  Principal Problem:   Acute on chronic systolic heart failure Active Problems:   CARDIOMYOPATHY, ISCHEMIC   Automatic implantable cardioverter-defibrillator in [employer]   AODM   TOBACCO ABUSE   Metabolic alkalosis  Repelete Cl contnue Continue to Push diuresis unitl Cr bumps with  decreased afterload so as to not excessively reduce BP it is stable Will measure uric acid level as he has a history of kidney stones     Signed, Sherryl Manges MD  12/08/2012

## 2012-12-09 LAB — GLUCOSE, CAPILLARY
Glucose-Capillary: 267 mg/dL — ABNORMAL HIGH (ref 70–99)
Glucose-Capillary: 248 mg/dL — ABNORMAL HIGH (ref 70–99)

## 2012-12-09 NOTE — Progress Notes (Signed)
Inpatient Diabetes Program Recommendations  AACE/ADA: New Consensus Statement on Inpatient Glycemic Control (2013)  Target Ranges:  Prepandial:   less than 140 mg/dL      Peak postprandial:   less than 180 mg/dL (1-2 hours)      Critically ill patients:  140 - 180 mg/dL   Continuous fasting hyperglycemia  Inpatient Diabetes Program Recommendations Insulin - Basal: Please consider addition of Lantus 10-15 units  to insulin regimen while here. HgbA1C: No known Hbg A1C.  Request order. Thank you, Lenor Coffin, RN, CNS, Diabetes Coordinator [phone])

## 2012-12-09 NOTE — Progress Notes (Signed)
    Subjective:  Denies CP; dyspnea improving   Objective:  Filed Vitals:   12/08/12 0237 12/08/12 0508 12/08/12 2229 12/09/12 0636  BP: 92/50 100/68 107/74 97/68  Pulse: 86 85 88 86  Temp:   98.1 F (36.7 C) 97.9 F (36.6 C)  TempSrc:  Oral Oral Oral  Resp:  16 17 18   Height:      Weight:  210 lb 4.8 oz (95.391 kg)  204 lb 3.2 oz (92.625 kg)  SpO2:  96% 98% 96%    Intake/Output from previous day:  Intake/Output Summary (Last 24 hours) at 12/09/12 7829 Last data filed at 12/08/12 2300  Gross per 24 hour  Intake    606 ml  Output   3500 ml  Net  -2894 ml    Physical Exam: Physical exam: Well-developed well-nourished in no acute distress.  Skin is warm and dry.  HEENT is normal.  Neck is supple.  Chest diminished BS; mild exp wheeze Cardiovascular exam is regular rate and rhythm.  Abdominal exam nontender or distended. No masses palpated. Extremities show 1+ edema. neuro grossly intact    Lab Results: Basic Metabolic Panel:  Recent Labs  56/21/30 0410  NA 136  K 4.2  CL 96  CO2 33*  GLUCOSE 252*  BUN 16  CREATININE 0.89  CALCIUM 8.7     Assessment/Plan:  1 Acute on Chronic Systolic CHF: Patient continues with volume excess but improving; continue lasix 80 mg IV BID and follow renal function and K. Strict I/Os, daily weights, 2gm Na diet. 1.5 liter fluid restriction. Plan outpatient eval following DC in CHF clinic. 2 Ischemic Cardiomyopathy: Continue current ACE inhibitor and Coreg.  3 CAD: No angina. Continue aspirin, statin.  4 Hypertension: continue present meds 5 Hyperlipidemia: Continue statin.  6 Diabetes Mellitus: Hold metformin, SSI. Follow CBG 7 S/p AICD: Follow up with EP as planned. Device at [facility], RV lead (Riata) on recall. Discussed with SK. He will fu as scheduled for device revision. 8 Tobacco Abuse: Patient counseled on DCing.   [name] 12/09/2012, 7:04 AM

## 2012-12-10 ENCOUNTER — Other Ambulatory Visit: Payer: BC Managed Care – PPO

## 2012-12-10 ENCOUNTER — Ambulatory Visit: Payer: BC Managed Care – PPO | Admitting: Cardiology

## 2012-12-10 LAB — GLUCOSE, CAPILLARY: Glucose-Capillary: 325 mg/dL — ABNORMAL HIGH (ref 70–99)

## 2012-12-10 LAB — BASIC METABOLIC PANEL
BUN: 16 mg/dL (ref 6–23)
CO2: 32 mEq/L (ref 19–32)
Calcium: 9.6 mg/dL (ref 8.4–10.5)
Creatinine, Ser: 0.87 mg/dL (ref 0.50–1.35)
GFR calc non Af Amer: >90 mL/min (ref >90)
Glucose, Bld: 254 mg/dL — ABNORMAL HIGH (ref 70–99)
Potassium: 5 mEq/L (ref 3.5–5.1)
Sodium: 134 mEq/L — ABNORMAL LOW (ref 135–145)

## 2012-12-10 NOTE — Progress Notes (Signed)
Diabetes Coordinator requesting A1c, Paged. lab order for the AM.

## 2012-12-10 NOTE — Progress Notes (Signed)
Nutrition Brief Note  Patient identified on the Malnutrition Screening Tool (MST) Report. Pt reports that he has had weight loss. Pt with ongoing diuresis, and suspect weight loss is 2/2 to this as pt denies poor appetite and is eating 100% of meals.  Wt Readings from Last 15 Encounters:  12/10/12 200 lb 11.2 oz (91.037 kg)  11/22/12 219 lb (99.338 kg)  11/05/12 206 lb (93.441 kg)  10/29/12 220 lb (99.791 kg)  10/15/12 210 lb (95.255 kg)  10/07/12 207 lb (93.895 kg)  10/01/12 209 lb 1.6 oz (94.847 kg)  07/09/12 210 lb (95.255 kg)  06/21/12 213 lb (96.616 kg)  04/25/12 211 lb (95.709 kg)  01/16/12 205 lb (92.987 kg)  10/25/11 220 lb (99.791 kg)  09/12/11 224 lb (101.606 kg)  08/09/10 218 lb (98.884 kg)  09/21/09 220 lb (99.791 kg)    Body mass index is 27.21 kg/(m^2). Patient meets criteria for Overweight based on current BMI.   Current diet order is Heart Healthy, patient is consuming approximately 100% of meals at this time. Labs and medications reviewed.   No nutrition interventions warranted at this time. If nutrition issues arise, please consult RD.   Jarold Motto MS, RD, LDN Pager: [phone] After-hours pager: [phone]

## 2012-12-10 NOTE — Progress Notes (Signed)
    Subjective:  Denies CP; dyspnea resolved.   Objective:  Filed Vitals:   12/09/12 0908 12/09/12 1356 12/09/12 2116 12/10/12 0603  BP: 104/67 96/63 91/63  97/67  Pulse: 88 90 88 84  Temp:  97.6 F (36.4 C) 97.9 F (36.6 C) 97.4 F (36.3 C)  TempSrc:  Oral Oral Oral  Resp: 20 18 18 17   Height:      Weight:    200 lb 11.2 oz (91.037 kg)  SpO2: 97% 98% 97% 99%    Intake/Output from previous day:  Intake/Output Summary (Last 24 hours) at 12/10/12 0612 Last data filed at 12/10/12 0243  Gross per 24 hour  Intake    683 ml  Output   4150 ml  Net  -3467 ml    Physical Exam: Physical exam: Well-developed well-nourished in no acute distress.  Skin is warm and dry.  HEENT is normal.  Neck is supple.  Chest CTA Cardiovascular exam is regular rate and rhythm.  Abdominal exam nontender or distended. No masses palpated. Extremities show 1+ edema. neuro grossly intact      Assessment/Plan:  1 Acute on Chronic Systolic CHF: Patient's volume status improving; continue lasix 80 mg IV BID; no recent BMET; check this AM and in AM. Strict I/Os, daily weights, 2gm Na diet. 1.5 liter fluid restriction. Plan outpatient eval following DC in CHF clinic. 2 Ischemic Cardiomyopathy: Continue current ACE inhibitor and Coreg.  3 CAD: No angina. Continue aspirin, statin.  4 Hypertension: continue present meds 5 Hyperlipidemia: Continue statin.  6 Diabetes Mellitus: Hold metformin, SSI. Follow CBG 7 S/p AICD: Follow up with EP as planned. Device at [facility], RV lead (Riata) on recall. Discussed with SK. He will fu as scheduled for device revision.  If renal function stable and patient continues to improve; plan DC in AM.   [name] 12/10/2012, 6:12 AM

## 2012-12-10 NOTE — Progress Notes (Signed)
PT O4x denise Cp , SOB or pain. Pt last BM 12-6 will give prune juice and continue to monitor

## 2012-12-11 LAB — BASIC METABOLIC PANEL
CO2: 32 mEq/L (ref 19–32)
Calcium: 9.3 mg/dL (ref 8.4–10.5)
Creatinine, Ser: 0.87 mg/dL (ref 0.50–1.35)
GFR calc non Af Amer: >90 mL/min (ref >90)
Glucose, Bld: 317 mg/dL — ABNORMAL HIGH (ref 70–99)
Potassium: 4.8 mEq/L (ref 3.5–5.1)
Sodium: 133 mEq/L — ABNORMAL LOW (ref 135–145)

## 2012-12-11 LAB — GLUCOSE, CAPILLARY
Glucose-Capillary: 273 mg/dL — ABNORMAL HIGH (ref 70–99)
Glucose-Capillary: 296 mg/dL — ABNORMAL HIGH (ref 70–99)

## 2012-12-11 MED ORDER — CARVEDILOL 6.25 MG PO TABS
6.2500 mg | ORAL_TABLET | Freq: Two times a day (BID) | ORAL | Status: DC
  Filled 2012-12-11 (×2): qty 1

## 2012-12-11 MED ORDER — FUROSEMIDE 80 MG PO TABS: ORAL_TABLET | ORAL | Status: DC

## 2012-12-11 MED ORDER — FUROSEMIDE 80 MG PO TABS
80.0000 mg | ORAL_TABLET | Freq: Two times a day (BID) | ORAL | Status: DC
  Administered 2012-12-11: 09:00:00 80 mg via ORAL
  Filled 2012-12-11 (×3): qty 1

## 2012-12-11 MED ORDER — POTASSIUM CHLORIDE CRYS ER 10 MEQ PO TBCR: EXTENDED_RELEASE_TABLET | ORAL | Status: DC

## 2012-12-11 MED ORDER — SPIRONOLACTONE 12.5 MG HALF TABLET: 12.5000 mg | ORAL_TABLET | Freq: Every day | ORAL | Status: DC

## 2012-12-11 MED ORDER — CARVEDILOL 6.25 MG PO TABS: 6.2500 mg | ORAL_TABLET | Freq: Two times a day (BID) | ORAL | Status: DC

## 2012-12-11 MED ORDER — FUROSEMIDE 80 MG PO TABS: 80.0000 mg | ORAL_TABLET | Freq: Two times a day (BID) | ORAL | Status: DC

## 2012-12-11 NOTE — Progress Notes (Addendum)
Pt d/c home. D/c instructions and medications reviewed with Pt. Pt states understanding. All Pt questions answered  

## 2012-12-11 NOTE — Discharge Summary (Signed)
Discharge Summary   Patient ID: [name] MRN: [id number], DOB/AGE: July 19, 1955 57 y.o. Admit date: 12/03/2012 D/C date:     12/11/2012  Primary Cardiologist: Dr. Jens Som and Dr. Graciela Husbands  Principal Problem:   Acute on chronic systolic heart failure   Active Problems:   Diabetes Mellitus   Tobacco abuse   Ischemic cardiomyopathy   Automatic implantable cardioverter-defibrillator in [facility]. Jude     Hospital Course: [name] is a 57 y.o. male with a history of DM, HTN, HLD, ongoing tobacco abuse, CAD s/p 3 vessel stenting, ischemic cardiomyopathy, systolic heart failure (EF 20% on 09/2012 ECHO), s/p ICD placement who was admitted on 12/03/12 with an acute exacerbation of his chronic systolic heart failure. His last hospitalization for CHF exacerbation was 09/2012. He was educated on taking an extra lasix or metolazone with weight gain after last admission, but he did not follow this recommendation. He was admitted and diuresed with Lasix IV 40mg  BID. Spironolactone was also added to his regimen. His carvedilol was decreased to 6.25g BID secondary to soft blood pressures. His hospital course was largely uncomplicated and he was successfully diuresed with with a net loss of 17,728 mL and 26lbs. His creatinine remained stable around .87.  His device was interrogated in October 2014, which revealed the battery was at [facility] and RV lead (Riata) was on recall. Otherwise it had normal device function. Dr. Graciela Husbands evaluated the device and reported that the Riata lead was in place with normal electrical function. Follow up appointments for [facility] with generator change scheduled on Monday. A BMET will also be checked at this time.   Dr. Jens Som has deemed [name] ready for discharge home today. He will be sent home on all home meds with the addition of spironolactone 12.5 mg. Coreg was decreased to 6.25 mg BID. His home medication of metolazone PRN was resumed with a decreased dosage of supplemental potassium  (because started spironolactone). Appointments for device generator change, CHF clinic, and follow up with Dr. Jens Som have been scheduled.    Discharge Vitals: Blood pressure 118/81, pulse 93, temperature 97.5 F (36.4 C), temperature source Oral, resp. rate 18, height 6' (1.829 m), weight 196 lb 12.8 oz (89.268 kg), SpO2 100.00%.  Labs: Lab Results  Component Value Date   WBC 7.1 12/02/2012   HGB 14.4 12/02/2012   HCT 42.7 12/02/2012   MCV 90.5 12/02/2012   PLT 133* 12/02/2012     Recent Labs Lab 12/11/12 0441  NA 133*  K 4.8  CL 94*  CO2 32  BUN 17  CREATININE 0.87  CALCIUM 9.3  GLUCOSE 317*   No results found for this basename: CKTOTAL, CKMB, TROPONINI,  in the last 72 hours Lab Results  Component Value Date   CHOL 168 08/09/2010   HDL 40.40 08/09/2010   LDLCALC 98 08/09/2010   TRIG 149.0 08/09/2010   No results found for this basename: DDIMER    Diagnostic Studies/Procedures   Dg Chest 2 View  12/02/2012   CLINICAL DATA:  Hypertension and congestive heart failure, lower extremity edema  EXAM: CHEST  2 VIEW  COMPARISON:  September 25, 2012  FINDINGS: Mild cardiac enlargement. Cardiac pacer in unchanged position. Mild vascular congestion. Small bilateral pleural effusions, similar to prior study. Hazy opacities at both lung bases also noted, which, when compared to the prior study, appear stable to slightly less prominent. Perihilar haziness has resolved.  IMPRESSION: Findings most consistent with mild chronic pulmonary edema. Bilateral mild lower  lobe pulmonary edema and atelectasis with small bilateral pleural effusions.   Electronically Signed   By: Esperanza Heir M.D.   On: 12/02/2012 23:15    Discharge Medications     Medication List         aspirin 81 MG tablet  Take 1 tablet (81 mg total) by mouth daily.     atorvastatin 80 MG tablet  Commonly known as:  LIPITOR  Take 80 mg by mouth every other day.     benazepril 5 MG tablet  Commonly known as:  LOTENSIN    Take 5 mg by mouth daily.     carvedilol 6.25 MG tablet  Commonly known as:  COREG  Take 1 tablet (6.25 mg total) by mouth 2 (two) times daily with a meal.     ezetimibe 10 MG tablet  Commonly known as:  ZETIA  Take 10 mg by mouth daily.     furosemide 80 MG tablet  Commonly known as:  LASIX  May take an additional 1 tablet daily as needed for weight gain of 2-3 lbs overnight.     glimepiride 4 MG tablet  Commonly known as:  AMARYL  Take 4 mg by mouth daily before breakfast.     metFORMIN 500 MG 24 hr tablet  Commonly known as:  GLUCOPHAGE-XR  Take 500 mg by mouth 2 (two) times daily.     metolazone 2.5 MG tablet  Commonly known as:  ZAROXOLYN  Take 2.5 mg by mouth See admin instructions. Take 2.5 mg daily for 2 consecutive days (two tablets per week) if needed for excess fluid when LASIX does not work.     potassium chloride 10 MEQ tablet  Commonly known as:  K-DUR,KLOR-CON  Take 10 meq for 2 consecutive days (TWO TABLETS PER WEEK WHEN TAKING METOLAZONE)     spironolactone 12.5 mg Tabs tablet  Commonly known as:  ALDACTONE  Take 0.5 tablets (12.5 mg total) by mouth daily.        Disposition   The patient will be discharged in stable condition to home. Discharge Orders   Future Appointments Provider Department Dept Phone   12/17/2012 1:40 PM [facility] [phone]   12/27/2012 4:15 PM Lewayne Bunting, MD [facility] [phone]   Future Orders Complete By Expires   Diet - low sodium heart healthy  As directed    Increase activity slowly  As directed      Follow-up Information   Follow up with [name], MD On 12/27/2012. (at 4:15pm)    Specialty:  Cardiology   Contact information:   [address] [place] [address] [phone]       Follow up with [facility] On 12/16/2012. (Please arrive to Short Stay at 5:30 am for a 7:30 am appointment  for pacemaker generator change and lab work. )    Solicitor information:   [address] [place] [address] [phone]      Follow up with [facility] On 12/17/2012. (@1 :30pm at the heart and vascular center. Jacelyn Pi Parking code [phone])    Contact information:   [address] [place] [address] [phone]        Duration of Discharge Encounter: Greater than 30 minutes including physician and PA time.  SignedVenetia Maxon, Yvonda Fouty PA-C 12/11/2012, 12:40 PM

## 2012-12-11 NOTE — Discharge Summary (Signed)
See progress notes [name]  

## 2012-12-11 NOTE — Progress Notes (Signed)
Pt O4x, complaints of CP or SOB. Will continue to monitor

## 2012-12-11 NOTE — Progress Notes (Addendum)
    Subjective:  Denies CP; dyspnea resolved.   Objective:  Filed Vitals:   12/10/12 1300 12/10/12 2030 12/11/12 0446 12/11/12 0607  BP: 99/65 106/72  103/69  Pulse: 95 91  86  Temp: 97.1 F (36.2 C) 98.2 F (36.8 C)  97.5 F (36.4 C)  TempSrc: Oral Oral  Oral  Resp: 18 19  18   Height:      Weight:   196 lb 12.8 oz (89.268 kg)   SpO2: 97% 98%  98%    Intake/Output from previous day:  Intake/Output Summary (Last 24 hours) at 12/11/12 0737 Last data filed at 12/11/12 4696  Gross per 24 hour  Intake   1060 ml  Output   2951 ml  Net  -1891 ml    Physical Exam: Physical exam: Well-developed well-nourished in no acute distress.  Skin is warm and dry.  HEENT is normal.  Neck is supple.  Chest CTA Cardiovascular exam is regular rate and rhythm.  Abdominal exam nontender or distended. No masses palpated. Extremities show no edema. neuro grossly intact      Assessment/Plan:  1 Acute on Chronic Systolic CHF: Patient's volume status improved; change lasix to 80 mg po BID. Strict I/Os, daily weights, 2gm Na diet. 1.5 liter fluid restriction.  2 Ischemic Cardiomyopathy: Continue current ACE inhibitor and Coreg (change to 6.25 BID).  3 CAD: No angina. Continue aspirin, statin.  4 Hypertension: continue present meds 5 Hyperlipidemia: Continue statin.  6 Diabetes Mellitus: Resume preadmission meds at DC 7 S/p AICD: Follow up with EP as planned. Device at [facility], RV lead (Riata) on recall. Discussed with SK. He will fu as scheduled for device revision on Monday.  DC today; return for generator change on Monday; check BMET at that time; fu with me in 2 weeks; arrange outpt CHF clinic appt. > 30 min PA and physician time D2  [name] 12/11/2012, 7:37 AM

## 2012-12-15 MED ORDER — CEFAZOLIN SODIUM-DEXTROSE 2-3 GM-% IV SOLR
2.0000 g | INTRAVENOUS | Status: DC
  Filled 2012-12-15: qty 50

## 2012-12-15 MED ORDER — SODIUM CHLORIDE 0.9 % IR SOLN
80.0000 mg | Status: DC
  Filled 2012-12-15: qty 2

## 2012-12-16 ENCOUNTER — Encounter (HOSPITAL_COMMUNITY)
Admission: RE | Disposition: A | Discharge status: Home / Self Care | Payer: Self-pay | Source: Ambulatory Visit | Attending: Internal Medicine

## 2012-12-16 ENCOUNTER — Ambulatory Visit (HOSPITAL_COMMUNITY)
Admission: RE | Admit: 2012-12-16 | Discharge: 2012-12-16 | Disposition: A | Discharge status: Home / Self Care | Payer: BC Managed Care – PPO | Source: Ambulatory Visit | Attending: Internal Medicine | Admitting: Internal Medicine

## 2012-12-16 DIAGNOSIS — I10 Essential (primary) hypertension: Secondary | ICD-10-CM | POA: Insufficient documentation

## 2012-12-16 DIAGNOSIS — Z4502 Encounter for adjustment and management of automatic implantable cardiac defibrillator: Secondary | ICD-10-CM | POA: Insufficient documentation

## 2012-12-16 DIAGNOSIS — E119 Type 2 diabetes mellitus without complications: Secondary | ICD-10-CM | POA: Insufficient documentation

## 2012-12-16 DIAGNOSIS — I2510 Atherosclerotic heart disease of native coronary artery without angina pectoris: Secondary | ICD-10-CM | POA: Insufficient documentation

## 2012-12-16 DIAGNOSIS — Z7982 Long term (current) use of aspirin: Secondary | ICD-10-CM | POA: Insufficient documentation

## 2012-12-16 DIAGNOSIS — I739 Peripheral vascular disease, unspecified: Secondary | ICD-10-CM | POA: Insufficient documentation

## 2012-12-16 DIAGNOSIS — I5022 Chronic systolic (congestive) heart failure: Secondary | ICD-10-CM | POA: Insufficient documentation

## 2012-12-16 DIAGNOSIS — I2589 Other forms of chronic ischemic heart disease: Secondary | ICD-10-CM | POA: Insufficient documentation

## 2012-12-16 DIAGNOSIS — F17200 Nicotine dependence, unspecified, uncomplicated: Secondary | ICD-10-CM | POA: Insufficient documentation

## 2012-12-16 DIAGNOSIS — I6529 Occlusion and stenosis of unspecified carotid artery: Secondary | ICD-10-CM | POA: Insufficient documentation

## 2012-12-16 DIAGNOSIS — T82198A Other mechanical complication of other cardiac electronic device, initial encounter: Secondary | ICD-10-CM

## 2012-12-16 DIAGNOSIS — E782 Mixed hyperlipidemia: Secondary | ICD-10-CM | POA: Insufficient documentation

## 2012-12-16 DIAGNOSIS — Z79899 Other long term (current) drug therapy: Secondary | ICD-10-CM | POA: Insufficient documentation

## 2012-12-16 HISTORY — PX: IMPLANTABLE CARDIOVERTER DEFIBRILLATOR (ICD) GENERATOR CHANGE: SHX5469

## 2012-12-16 LAB — GLUCOSE, CAPILLARY: Glucose-Capillary: 282 mg/dL — ABNORMAL HIGH (ref 70–99)

## 2012-12-16 LAB — CBC
HCT: 47.4 % (ref 39.0–52.0)
MCHC: 34.8 g/dL (ref 30.0–36.0)
MCV: 88.3 fL (ref 78.0–100.0)
Platelets: 193 10*3/uL (ref 150–400)
RDW: 14.2 % (ref 11.5–15.5)
WBC: 9.5 10*3/uL (ref 4.0–10.5)

## 2012-12-16 LAB — BASIC METABOLIC PANEL
CO2: 30 mEq/L (ref 19–32)
Calcium: 9.8 mg/dL (ref 8.4–10.5)
Creatinine, Ser: 0.86 mg/dL (ref 0.50–1.35)
GFR calc non Af Amer: >90 mL/min (ref >90)
Sodium: 136 mEq/L (ref 135–145)

## 2012-12-16 LAB — SURGICAL PCR SCREEN: Staphylococcus aureus: NEGATIVE

## 2012-12-16 SURGERY — ICD GENERATOR CHANGE
Anesthesia: LOCAL

## 2012-12-16 MED ORDER — CHLORHEXIDINE GLUCONATE 4 % EX LIQD
60.0000 mL | Freq: Once | CUTANEOUS | Status: DC
  Filled 2012-12-16: qty 60

## 2012-12-16 MED ORDER — MUPIROCIN 2 % EX OINT
TOPICAL_OINTMENT | CUTANEOUS | Status: AC
  Filled 2012-12-16: qty 22

## 2012-12-16 MED ORDER — FENTANYL CITRATE 0.05 MG/ML IJ SOLN
INTRAMUSCULAR | Status: AC
  Filled 2012-12-16: qty 2

## 2012-12-16 MED ORDER — MUPIROCIN 2 % EX OINT
TOPICAL_OINTMENT | Freq: Two times a day (BID) | CUTANEOUS | Status: DC
  Administered 2012-12-16: 06:00:00 via NASAL

## 2012-12-16 MED ORDER — SODIUM CHLORIDE 0.9 % IV SOLN
INTRAVENOUS | Status: DC
  Administered 2012-12-16: 1000 mL via INTRAVENOUS

## 2012-12-16 MED ORDER — SODIUM CHLORIDE 0.9 % IV SOLN: 250.0000 mL | INTRAVENOUS | Status: DC | PRN

## 2012-12-16 MED ORDER — SODIUM CHLORIDE 0.9 % IJ SOLN: 3.0000 mL | INTRAMUSCULAR | Status: DC | PRN

## 2012-12-16 MED ORDER — ACETAMINOPHEN 325 MG PO TABS
325.0000 mg | ORAL_TABLET | ORAL | Status: DC | PRN
  Filled 2012-12-16: qty 2

## 2012-12-16 MED ORDER — LIDOCAINE HCL (PF) 1 % IJ SOLN
INTRAMUSCULAR | Status: AC
  Filled 2012-12-16: qty 60

## 2012-12-16 MED ORDER — ONDANSETRON HCL 4 MG/2ML IJ SOLN: 4.0000 mg | Freq: Four times a day (QID) | INTRAMUSCULAR | Status: DC | PRN

## 2012-12-16 MED ORDER — MIDAZOLAM HCL 5 MG/5ML IJ SOLN
INTRAMUSCULAR | Status: AC
  Filled 2012-12-16: qty 5

## 2012-12-16 MED ORDER — SODIUM CHLORIDE 0.9 % IJ SOLN: 3.0000 mL | Freq: Two times a day (BID) | INTRAMUSCULAR | Status: DC

## 2012-12-16 NOTE — Interval H&P Note (Signed)
ICD Criteria2 2 Current LVEF (within 6 months):20%  NYHA Functional Classification: Class III  Heart Failure History:  Yes, Duration of heart failure since onset is > 9 months  Non-Ischemic Dilated Cardiomyopathy History:  No.  Atrial Fibrillation/Atrial Flutter:  No.  Ventricular Tachycardia History:  No.  Cardiac Arrest History:  No  History of Syndromes with Risk of Sudden Death:  No.  Previous ICD:  Yes, ICD Type:  siinge, Reason for ICD:  Primary prevention.  LVEF is not available  Electrophysiology Study: No.  Prior MI: Yes, Most recent MI timeframe is > 40 days.  PPM: No.  OSA:  No  Patient Life Expectancy of >=1 year: Yes.  Anticoagulation Therapy:  Patient is NOT on anticoagulation therapy.   Beta Blocker Therapy:  Yes.   Ace Inhibitor/ARB Therapy:  Yes.History and Physical Interval Note:  12/16/2012 6:59 AM  [name]  has presented today for surgery, with the diagnosis of eri  The various methods of treatment have been discussed with the patient and family. After consideration of risks, benefits and other options for treatment, the patient has consented to  Procedure(s): ICD GENERATOR CHANGE (N/A) as a surgical intervention .  The patient's history has been reviewed, patient examined, no change in status, stable for surgery.  I have reviewed the patient's chart and labs.  Questions were answered to the patient's satisfaction.     Sherryl Manges

## 2012-12-16 NOTE — CV Procedure (Signed)
Preoperative diagnosis  ICM prev ICD now @[facility]  Postoperative diagnosis same/   Procedure: Generator replacement  Pocket revision flouroscopy of lead  Following informed consent the patient was brought to the electrophysiology laboratory in place of the fluoroscopic table in the supine position after routine prep and drape lidocaine was infiltrated in the region of the previous incision and carried down to later the device pocket using sharp dissection and electrocautery. The pocket was opened the device was freed up and was explanted.  Interrogation of the previously implanted ICD ventricular lead 7001  demonstrated an R wave of 16.5  millivolts., and impedance of 495 ohms, and a pacing threshold of 0.5 volts at 0.5 msec.   The leads was inspected. Repair was not needed. The leads were then attached to a St Jude  pulse generator, serial number [id number].    Through the device   the ICD ventricular lead demonstrated an R wave of 11.7  millivolts., and impedance of 510 ohms, and a pacing threshold of 0.5 volts at 0.5 msec.    High voltage impedances were 48  ohms  The pocket was irrigated with antibiotic containing saline solution hemostasis was assured and the leads and the device were placed in the pocket. The wound was then closed in 3 layers in normal fashion.n  Dermaobond dressing was applied  The patient tolerated the procedure without apparent complication.  DFT testing was not performed  Sherryl Manges   \

## 2012-12-16 NOTE — H&P (View-Only) (Signed)
S.kr       Patient Name: [name]      SUBJECTIVE:57 known CAD 3V PCI with EF 20-30% admitted for recurrence of A/C HFrEF Rx with diuresis  Prev ICD at [facility] with change out scheduled in two weeks  Riata l ead  Past Medical History  Diagnosis Date  . Hyperlipidemia     mixed  . Chronic systolic heart failure   . ICD (implantable cardiac defibrillator), dual, in situ     St. Jude for severe LVD EF 25% 2/07 explanted 2010. Medtronic Virtuoso II DR Dual-chamber cardiverter-defibrillator  with pocket revision, Dr. Klein  . Tobacco abuse   . Ischemic cardiomyopathy     a. Echo (09/27/12): EF 20%, diffuse HK with periapical AK, no LV thrombus noted, restrictive physiology, trivial MR, mild LAE, RVSF mildly reduced, PASP 39  . Cerebrovascular disease   . Diabetes mellitus   . HTN (hypertension)   . PVD (peripheral vascular disease)     a. ABI 09/2011 - R normal, L moderate - saw Dr. Arida - med rx.   . Carotid artery disease     a. Dopp 09/2011: 60-79% RICA, 40-59% LICA.;  b.  Carotid US (7/14):  Bilateral 60-79% => f/u 6 mos  . Coronary artery disease     a. AWMI requiring IABP 2005 s/p Horizon study stent-LAD, staged BMS-Cx, DESx2-RCA. b. Last Last LHC (6/06):  EF 25%, pLAD stent 40-50, after stent 40, dLAD 20, pCFX 20, pOM1 70, pRCA 20, mRCA stents ok.=> med Rx;  c.  Myoview (7/14):  EF 26%, large ant, septal, inf and apical infarct, no ischemia.  Med Rx continued  . RIATA ICD Lead--on advisory recall      Scheduled Meds:  Scheduled Meds: . aspirin EC  81 mg Oral Daily  . atorvastatin  80 mg Oral QODAY  . benazepril  5 mg Oral Daily  . carvedilol  6.25 mg Oral BID WC  . ezetimibe  10 mg Oral Daily  . furosemide  80 mg Intravenous BID  . heparin  5,000 Units Subcutaneous Q8H  . insulin aspart  0-9 Units Subcutaneous TID WC  . insulin aspart  3 Units Subcutaneous TID WC  . sodium chloride  3 mL Intravenous Q12H  . spironolactone  25 mg Oral Daily   Continuous Infusions:     PHYSICAL EXAM Filed Vitals:   12/06/12 1917 12/06/12 2100 12/07/12 0553 12/07/12 0814  BP:  92/55 96/58   Pulse:  84 81   Temp:  98 F (36.7 C) 97.1 F (36.2 C)   TempSrc:  Oral Oral   Resp:  18 19   Height: 6' (1.829 m)     Weight:    214 lb 4.8 oz (97.206 kg)  SpO2:  98% 93%    Wt down 3.5 Lbs and I/Os-5.5L  General appearance: alert, cooperative and no distress Neck: JVD - 10 cm above sternal notch, no adenopathy, no carotid bruit, no JVD, supple, symmetrical, trachea midline and thyroid not enlarged, symmetric, no tenderness/mass/nodules Lungs: clear to auscultation bilaterally Heart: regular rate and rhythm, S1, S2 normal, no murmur, click, rub or gallop Extremities: edema 2+ Skin: Skin color, texture, turgor normal. No rashes or lesions Neurologic: Alert and oriented X 3, normal strength and tone. Normal symmetric reflexes. Normal coordination and gait    TELEMETRY: Reviewed telemetry pt in nsr with rapid rates:    Intake/Output Summary (Last 24 hours) at 12/07/12 0916 Last data filed at 12/07/12 0826  Gross   per 24 hour  Intake    723 ml  Output   1900 ml  Net  -1177 ml    LABS: Basic Metabolic Panel:  Recent Labs Lab 12/02/12 2159 12/03/12 0550 12/04/12 0545 12/05/12 0540 12/06/12 0626 12/07/12 0410  NA 137  --  138 133* 135 136  K 4.1  --  4.3 4.6 4.9 4.2  CL 98  --  99 94* 96 96  CO2 28  --  27 31 32 33*  GLUCOSE 295*  --  239* 238* 207* 252*  BUN 11  --  19 18 17 16  CREATININE 0.80  --  0.78 0.81 0.80 0.89  CALCIUM 9.1  --  9.0 8.8 9.1 8.7  MG  --  1.7  --   --   --   --    Cardiac Enzymes: No results found for this basename: CKTOTAL, CKMB, CKMBINDEX, TROPONINI,  in the last 72 hours CBC:  Recent Labs Lab 12/02/12 2159  WBC 7.1  NEUTROABS 5.1  HGB 14.4  HCT 42.7  MCV 90.5  PLT 133*   PROTIME: No results found for this basename: LABPROT, INR,  in the last 72 hours Liver Function Tests: No results found for this basename: AST,  ALT, ALKPHOS, BILITOT, PROT, ALBUMIN,  in the last 72 hours No results found for this basename: LIPASE, AMYLASE,  in the last 72 hours BNP: BNP (last 3 results)  Recent Labs  09/26/12 0113 10/29/12 1002 12/02/12 2159  PROBNP 1366.0* 543.0* 2717.0*    ASSESSMENT AND PLAN:  Principal Problem:   Acute on chronic systolic heart failure Active Problems:   CARDIOMYOPATHY, ISCHEMIC   Automatic implantable cardioverter-defibrillator in St. Jude   AODM   TOBACCO ABUSE   Metabolic alkalosis  Repelete Cl Push diuresis and decrease afterload so as to not excessively reduce BP  May need ionotropic support to diurese May have DB see in am   Signed, Steven Klein MD  12/07/2012   

## 2012-12-17 ENCOUNTER — Ambulatory Visit (HOSPITAL_BASED_OUTPATIENT_CLINIC_OR_DEPARTMENT_OTHER)
Admission: RE | Admit: 2012-12-17 | Discharge: 2012-12-17 | Disposition: A | Discharge status: Home / Self Care | Payer: BC Managed Care – PPO | Source: Ambulatory Visit | Attending: Internal Medicine | Admitting: Internal Medicine

## 2012-12-17 VITALS — BP 100/62 | HR 100 | Wt 192.0 lb

## 2012-12-17 DIAGNOSIS — I10 Essential (primary) hypertension: Secondary | ICD-10-CM

## 2012-12-17 DIAGNOSIS — E785 Hyperlipidemia, unspecified: Secondary | ICD-10-CM

## 2012-12-17 DIAGNOSIS — F17200 Nicotine dependence, unspecified, uncomplicated: Secondary | ICD-10-CM

## 2012-12-17 DIAGNOSIS — I5022 Chronic systolic (congestive) heart failure: Secondary | ICD-10-CM

## 2012-12-17 DIAGNOSIS — I2510 Atherosclerotic heart disease of native coronary artery without angina pectoris: Secondary | ICD-10-CM

## 2012-12-17 MED ORDER — CARVEDILOL 6.25 MG PO TABS: 9.3750 mg | ORAL_TABLET | Freq: Two times a day (BID) | ORAL | Status: DC

## 2012-12-17 NOTE — Progress Notes (Signed)
Patient ID: [name], male   DOB: March 20, 1955, 57 y.o.   MRN: [id number]  Weight Range  188-193 pounds at home    Baseline proBNP    PCP: Dr Sigmund Hazel EP: Dr Graciela Husbands  Cardiologist: Dr Jens Som  HPI: [name] is a 57 y.o. male with a history of DM, HTN, HLD, ongoing tobacco abuse, CAD s/p 3 vessel stenting, ischemic cardiomyopathy, systolic heart failure (EF 20% on 09/2012 ECHO), stopped drinking 14 years ago former 1 case of beer per day, s/p ICD placement who was admitted on 12/03/12 with an acute exacerbation of his chronic systolic heart failure. Last LHC (6/06): EF 25%, pLAD stent 40-50, after stent 40, dLAD 20, pCFX 20, pOM1 70, pRCA 20, mRCA stents ok. Myoview (7/14): EF 26%, large ant, septal, inf and apical infarct, no ischemia. Carotid US (7/14): Bilateral 60-79% => f/u 6 mos. ABIs (9/13): normal on R and mod on L => seen by Dr. Kirke Corin and med Rx recommended.   Admitted to [facility] 12/21/4 for volume overload. Diuresed with IV lasix and transitioned to 80 mg lasix bid. Discharge weight 196 pounds  12/16/12 [facility] replacement    He presents today as a new patient at the request of Dr Jens Som. Mild dyspnea going up steps. Denies PND/Orthopnea/CP.  Complains of leg fatigue. Rarely dizzy. Weight at home 188-190 pounds. Taking all medications. Smokes 1/2 ppd of cigarettes. Tries to follow low salt diet and limits his fluid to < 2 liters per day. He has f/u with PCP this week. Currently not working due to ICD replacement.   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 Smokes 1/2 PPD of cigarettes. Lives with his Mom. Works nights full time at [employer].  FH:  Father had MI   ROS: All systems negative except as listed in HPI, PMH and Problem List.  Past Medical History  Diagnosis Date  . Hyperlipidemia     mixed  . Chronic systolic heart failure   . ICD (implantable cardiac defibrillator),  dual, in situ     St. Jude for severe LVD EF 25% 2/07 explanted 2010. Medtronic Virtuoso II DR Dual-chamber cardiverter-defibrillator  with pocket revision, Dr. Graciela Husbands  . Tobacco abuse   . Ischemic cardiomyopathy     a. Echo (09/27/12): EF 20%, diffuse HK with periapical AK, no LV thrombus noted, restrictive physiology, trivial MR, mild LAE, RVSF mildly reduced, PASP 39  . Cerebrovascular disease   . Diabetes mellitus   . HTN (hypertension)   . PVD (peripheral vascular disease)     a. ABI 09/2011 - R normal, L moderate - saw Dr. Kirke Corin - med rx.   . Carotid artery disease     a. Dopp 09/2011: 60-79% RICA, 40-59% LICA.;  b.  Carotid US (7/14):  Bilateral 60-79% => f/u 6 mos  . Coronary artery disease     a. AWMI requiring IABP 2005 s/p Horizon study stent-LAD, staged BMS-Cx, DESx2-RCA. b. Last Last LHC (6/06):  EF 25%, pLAD stent 40-50, after stent 40, dLAD 20, pCFX 20, pOM1 70, pRCA 20, mRCA stents ok.=> med Rx;  c.  Myoview (7/14):  EF 26%, large ant, septal, inf and apical infarct, no ischemia.  Med Rx continued  . RIATA ICD Lead--on advisory recall      Current Outpatient Prescriptions  Medication Sig Dispense Refill  . aspirin 81 MG tablet Take 1  tablet (81 mg total) by mouth daily.  30 tablet  3  . atorvastatin (LIPITOR) 80 MG tablet Take 80 mg by mouth every other day.       . benazepril (LOTENSIN) 5 MG tablet Take 5 mg by mouth daily.      . carvedilol (COREG) 6.25 MG tablet Take 1 tablet (6.25 mg total) by mouth 2 (two) times daily with a meal.  60 tablet  11  . ezetimibe (ZETIA) 10 MG tablet Take 10 mg by mouth daily.      . furosemide (LASIX) 80 MG tablet Take 1 tablet (80 mg total) by mouth 2 (two) times daily.  60 tablet  6  . glimepiride (AMARYL) 4 MG tablet Take 4 mg by mouth daily before breakfast.      . metFORMIN (GLUCOPHAGE-XR) 500 MG 24 hr tablet Take 500 mg by mouth 2 (two) times daily.      . metolazone (ZAROXOLYN) 2.5 MG tablet Take 2.5 mg by mouth See admin instructions.  Take 2.5 mg daily for 2 consecutive days (two tablets per week) if needed for excess fluid when LASIX does not work.      . potassium chloride SA (K-DUR,KLOR-CON) 10 MEQ tablet Take 10 meq for 2 consecutive days (TWO TABLETS PER WEEK WHEN TAKING METOLAZONE)  30 tablet  6  . spironolactone (ALDACTONE) 12.5 mg TABS tablet Take 0.5 tablets (12.5 mg total) by mouth daily.  30 tablet  6   No current facility-administered medications for this encounter.     PHYSICAL EXAM: Filed Vitals:   12/17/12 1331  BP: 100/62  Pulse: 100  Weight: 192 lb (87.091 kg)  SpO2: 99%    General:  Chronically ill appearing. No resp difficulty HEENT: normal Neck: supple. JVP ~5  Carotids 2+ bilaterally; no bruits. No lymphadenopathy or thryomegaly appreciated. Cor: PMI normal. Regular rate & rhythm. No rubs, gallops or murmurs. Lungs: clear Abdomen: soft, nontender, nondistended. No hepatosplenomegaly. No bruits or masses. Good bowel sounds. Extremities: no cyanosis, clubbing, rash, edema. R and LLE chronic hyperpigmentation.  Neuro: alert & orientedx3, cranial nerves grossly intact. Moves all 4 extremities w/o difficulty. Affect pleasant.      ASSESSMENT & PLAN: 1. Chronic Systolic Heart Failure ICM, EF 20% 09/2012 has St Jude ICD. NYHA II.  Volume status stable. Continue lasix 80 mg twice a day and he is instructed to take an extra 80 mg of lasix if your weight is 194 or greater. Continue 12.5 mg spironolactone daily.  Increase carvedilol 9.375 mg twice a day which is 1 1/2 tablets per day.  Continue benazepril 5 mg daily. Reinforced daily weights, low salt food choices, and limiting fluid intake to < 2 liters per day.  Reviewed labs from 12/16/12 K 3.9 Creatinine 0.86  2. CAD - ICM S/P  3 vessel stenting . No evidence of ischemia. Continue aspirin and statin 3. Tobacco Abuse-Smokes 1/2 PPD Declines smoking cessation. Encouraged to stop smoking.  4. DM - follow up with PCP this week. Glucose elevated  12/16/12. On metformin and amaryl 4 mg daily.  5. Hyperlipidemia- continue statin   Cherrie Franca NP-C  2:11 PM

## 2012-12-17 NOTE — Patient Instructions (Signed)
Follow up in 3 weeks  Take carvedilol 9.375 mg twice a day which is 1 1/2 tablets per day  If your weight is 194 pounds take an extra 80 mg lasix  Do the following things EVERYDAY: 1) Weigh yourself in the morning before breakfast. Write it down and keep it in a log. 2) Take your medicines as prescribed 3) Eat low salt foods-Limit salt (sodium) to 2000 mg per day.  4) Stay as active as you can everyday 5) Limit all fluids for the day to less than 2 liters

## 2012-12-23 ENCOUNTER — Telehealth: Payer: Self-pay | Admitting: Cardiology

## 2012-12-23 NOTE — Telephone Encounter (Signed)
Walk in Pt Form " Return To Work Certification" Dropped off gave to Victorio Palm

## 2012-12-24 ENCOUNTER — Ambulatory Visit (INDEPENDENT_AMBULATORY_CARE_PROVIDER_SITE_OTHER): Payer: BC Managed Care – PPO | Admitting: *Deleted

## 2012-12-24 DIAGNOSIS — I5023 Acute on chronic systolic (congestive) heart failure: Secondary | ICD-10-CM

## 2012-12-24 DIAGNOSIS — I2589 Other forms of chronic ischemic heart disease: Secondary | ICD-10-CM

## 2012-12-24 LAB — MDC_IDC_ENUM_SESS_TYPE_INCLINIC
Battery Remaining Longevity: 97.2 mo
Brady Statistic RV Percent Paced: 0 %
Date Time Interrogation Session: 20141223092747
HighPow Impedance: 56 Ohm
Implantable Pulse Generator Serial Number: 7155171
Lead Channel Pacing Threshold Amplitude: 0.75 V
Lead Channel Pacing Threshold Amplitude: 0.75 V
Lead Channel Pacing Threshold Pulse Width: 0.5 ms
Lead Channel Sensing Intrinsic Amplitude: 11.7 mV
Lead Channel Setting Pacing Amplitude: 2.5 V
Lead Channel Setting Pacing Pulse Width: 0.5 ms
Zone Setting Detection Interval: 250 ms
Zone Setting Detection Interval: 300 ms

## 2012-12-24 NOTE — Progress Notes (Signed)
Wound check appointment.  Wound without redness or edema. Incision edges approximated, wound well healed. Normal device function. Thresholds, sensing, and impedances consistent with implant measurements. Device programmed at 3.5V for extra safety margin until 3 month visit. Histogram distribution appropriate for patient and level of activity. No mode switches or ventricular arrhythmias noted. Patient educated about wound care, arm mobility, lifting restrictions, shock plan. ROV in 3 months with implanting physician. 

## 2012-12-27 ENCOUNTER — Ambulatory Visit (INDEPENDENT_AMBULATORY_CARE_PROVIDER_SITE_OTHER): Payer: BC Managed Care – PPO | Admitting: Cardiology

## 2012-12-27 ENCOUNTER — Encounter: Payer: Self-pay | Admitting: Cardiology

## 2012-12-27 VITALS — BP 98/69 | HR 75 | Ht 72.0 in | Wt 199.0 lb

## 2012-12-27 DIAGNOSIS — I2589 Other forms of chronic ischemic heart disease: Secondary | ICD-10-CM

## 2012-12-27 DIAGNOSIS — Z95810 Presence of automatic (implantable) cardiac defibrillator: Secondary | ICD-10-CM

## 2012-12-27 DIAGNOSIS — F17200 Nicotine dependence, unspecified, uncomplicated: Secondary | ICD-10-CM

## 2012-12-27 DIAGNOSIS — I6523 Occlusion and stenosis of bilateral carotid arteries: Secondary | ICD-10-CM

## 2012-12-27 DIAGNOSIS — I5022 Chronic systolic (congestive) heart failure: Secondary | ICD-10-CM

## 2012-12-27 DIAGNOSIS — I739 Peripheral vascular disease, unspecified: Secondary | ICD-10-CM

## 2012-12-27 DIAGNOSIS — I658 Occlusion and stenosis of other precerebral arteries: Secondary | ICD-10-CM

## 2012-12-27 DIAGNOSIS — I2510 Atherosclerotic heart disease of native coronary artery without angina pectoris: Secondary | ICD-10-CM

## 2012-12-27 DIAGNOSIS — I6529 Occlusion and stenosis of unspecified carotid artery: Secondary | ICD-10-CM

## 2012-12-27 DIAGNOSIS — I10 Essential (primary) hypertension: Secondary | ICD-10-CM

## 2012-12-27 NOTE — Assessment & Plan Note (Signed)
Blood pressure controlled. Continue present medications. 

## 2012-12-27 NOTE — Assessment & Plan Note (Signed)
Continue aspirin and statin. Followup carotid Dopplers January 2015.

## 2012-12-27 NOTE — Assessment & Plan Note (Signed)
Continue statin. 

## 2012-12-27 NOTE — Assessment & Plan Note (Signed)
Followed by electrophysiology. 

## 2012-12-27 NOTE — Assessment & Plan Note (Signed)
Continue aspirin and statin. 

## 2012-12-27 NOTE — Assessment & Plan Note (Signed)
Patient counseled on discontinuing. 

## 2012-12-27 NOTE — Assessment & Plan Note (Signed)
Patient euvolemic on examination. Continue present dose of diuretics. He is now followed in CHF clinic.

## 2012-12-27 NOTE — Assessment & Plan Note (Signed)
Continue ACE inhibitor and beta blocker. 

## 2012-12-27 NOTE — Patient Instructions (Signed)
Your physician recommends that you schedule a follow-up appointment in: 3 MONTHS WITH DR CRENSHAW  

## 2012-12-27 NOTE — Progress Notes (Signed)
HPI: FU CAD and CHF. S/p ant MI in 2005 req IABP tx with Horizon study stent to the LAD followed by staged BMS to the CFX and DES x 2 to RCA, Ischemic CM, systolic CHF, s/p AICD, HTN, DM2, HL, carotid stenosis, PAD. Last LHC (6/06): EF 25%, pLAD stent 40-50, after stent 40, dLAD 20, pCFX 20, pOM1 70, pRCA 20, mRCA stents ok. Abdominal US (2/11): No AAA. Myoview (7/14): EF 26%, large ant, septal, inf and apical infarct, no ischemia. Med Rx continued. Carotid US (7/14): Bilateral 60-79% => f/u 6 mos. ABIs (9/13): normal on R and mod on L => seen by Dr. Kirke Corin and med Rx recommended.  Recent admissions for congestive heart failure. Echocardiogram September 2014 showed an ejection fraction of 20%. There was restrictive physiology. Patient treated with diuretics with improvement. Patient had his ICD generator changed on December 15 of this month. He also is followed now in congestive heart failure clinic. Since that time his dyspnea has improved. He denies dyspnea on exertion, exertional chest pain, pedal edema or syncope.  Current Outpatient Prescriptions  Medication Sig Dispense Refill  . aspirin 81 MG tablet Take 1 tablet (81 mg total) by mouth daily.  30 tablet  3  . atorvastatin (LIPITOR) 80 MG tablet Take 80 mg by mouth every other day.       . benazepril (LOTENSIN) 5 MG tablet Take 5 mg by mouth daily.      . carvedilol (COREG) 6.25 MG tablet Take 1.5 tablets (9.375 mg total) by mouth 2 (two) times daily with a meal.  90 tablet  11  . ezetimibe (ZETIA) 10 MG tablet Take 10 mg by mouth daily.      . furosemide (LASIX) 80 MG tablet Take 1 tablet (80 mg total) by mouth 2 (two) times daily.  60 tablet  6  . glimepiride (AMARYL) 4 MG tablet Take 4 mg by mouth daily before breakfast.      . metFORMIN (GLUCOPHAGE-XR) 500 MG 24 hr tablet Take 500 mg by mouth 2 (two) times daily.      . metolazone (ZAROXOLYN) 2.5 MG tablet Take 2.5 mg by mouth See admin instructions. Take 2.5 mg daily for 2  consecutive days (two tablets per week) if needed for excess fluid when LASIX does not work.      . potassium chloride SA (K-DUR,KLOR-CON) 10 MEQ tablet Take 10 meq for 2 consecutive days (TWO TABLETS PER WEEK WHEN TAKING METOLAZONE)  30 tablet  6  . spironolactone (ALDACTONE) 12.5 mg TABS tablet Take 0.5 tablets (12.5 mg total) by mouth daily.  30 tablet  6   No current facility-administered medications for this visit.     Past Medical History  Diagnosis Date  . Hyperlipidemia     mixed  . Chronic systolic heart failure   . ICD (implantable cardiac defibrillator), dual, in situ     St. Jude for severe LVD EF 25% 2/07 explanted 2010. Medtronic Virtuoso II DR Dual-chamber cardiverter-defibrillator  with pocket revision, Dr. Graciela Husbands  . Tobacco abuse   . Ischemic cardiomyopathy     a. Echo (09/27/12): EF 20%, diffuse HK with periapical AK, no LV thrombus noted, restrictive physiology, trivial MR, mild LAE, RVSF mildly reduced, PASP 39  . Cerebrovascular disease   . Diabetes mellitus   . HTN (hypertension)   . PVD (peripheral vascular disease)     a. ABI 09/2011 - R normal, L moderate - saw Dr. Kirke Corin - med rx.   Marland Kitchen  Carotid artery disease     a. Dopp 09/2011: 60-79% RICA, 40-59% LICA.;  b.  Carotid US (7/14):  Bilateral 60-79% => f/u 6 mos  . Coronary artery disease     a. AWMI requiring IABP 2005 s/p Horizon study stent-LAD, staged BMS-Cx, DESx2-RCA. b. Last Last LHC (6/06):  EF 25%, pLAD stent 40-50, after stent 40, dLAD 20, pCFX 20, pOM1 70, pRCA 20, mRCA stents ok.=> med Rx;  c.  Myoview (7/14):  EF 26%, large ant, septal, inf and apical infarct, no ischemia.  Med Rx continued  . RIATA ICD Lead--on advisory recall      Past Surgical History  Procedure Laterality Date  . Medtronic virtuoso ii dr dual-chamber cardioverter-defibrillation with pocket revison      Dr. Sherryl Manges    History   Social History  . Marital Status: Single    Spouse Name: N/A    Number of Children: N/A  .  Years of Education: N/A   Occupational History  . [employer]    Full time   Social History Main Topics  . Smoking status: Current Some Day Smoker -- 1.00 packs/day for 33 years    Types: Cigarettes  . Smokeless tobacco: Never Used     Comment: smoker for 33 years  . Alcohol Use: No  . Drug Use: No     Comment: former Cocain, Acid, Marijuana - 25 years ago  . Sexual Activity: Not on file   Other Topics Concern  . Not on file   Social History Narrative   Divorced.    ROS: no fevers or chills, productive cough, hemoptysis, dysphasia, odynophagia, melena, hematochezia, dysuria, hematuria, rash, seizure activity, orthopnea, PND, pedal edema, claudication. Remaining systems are negative.  Physical Exam: Well-developed well-nourished in no acute distress.  Skin is warm and dry.  HEENT is normal.  Neck is supple.  Chest is clear to auscultation with normal expansion. ICD site without evidence of hematoma or infection. Cardiovascular exam is regular rate and rhythm.  Abdominal exam nontender or distended. No masses palpated. Extremities show no edema. neuro grossly intact

## 2012-12-31 ENCOUNTER — Telehealth: Payer: Self-pay | Admitting: Cardiology

## 2012-12-31 NOTE — Telephone Encounter (Signed)
Refax [employer] paperwork to return to work/ done.

## 2012-12-31 NOTE — Telephone Encounter (Signed)
New message     Talk to Stanton Kidney regarding faxing something to his work

## 2013-01-06 ENCOUNTER — Ambulatory Visit (HOSPITAL_COMMUNITY)
Admission: RE | Admit: 2013-01-06 | Discharge: 2013-01-06 | Disposition: A | Discharge status: Home / Self Care | Payer: BC Managed Care – PPO | Source: Ambulatory Visit | Attending: Internal Medicine | Admitting: Internal Medicine

## 2013-01-06 VITALS — BP 100/62 | HR 98 | Wt 204.4 lb

## 2013-01-06 DIAGNOSIS — F17200 Nicotine dependence, unspecified, uncomplicated: Secondary | ICD-10-CM

## 2013-01-06 DIAGNOSIS — E119 Type 2 diabetes mellitus without complications: Secondary | ICD-10-CM | POA: Insufficient documentation

## 2013-01-06 DIAGNOSIS — I2510 Atherosclerotic heart disease of native coronary artery without angina pectoris: Secondary | ICD-10-CM | POA: Insufficient documentation

## 2013-01-06 DIAGNOSIS — I5022 Chronic systolic (congestive) heart failure: Secondary | ICD-10-CM | POA: Insufficient documentation

## 2013-01-06 DIAGNOSIS — E785 Hyperlipidemia, unspecified: Secondary | ICD-10-CM

## 2013-01-06 DIAGNOSIS — I5023 Acute on chronic systolic (congestive) heart failure: Secondary | ICD-10-CM

## 2013-01-06 MED ORDER — SPIRONOLACTONE 12.5 MG HALF TABLET: 12.5000 mg | ORAL_TABLET | Freq: Every day | ORAL | Status: DC

## 2013-01-06 MED ORDER — CARVEDILOL 6.25 MG PO TABS: 9.3750 mg | ORAL_TABLET | Freq: Three times a day (TID) | ORAL | Status: DC

## 2013-01-06 NOTE — Patient Instructions (Addendum)
Follow up 4 weeks  Bring all medications to next visit  Take 12.5 mg spironolactone daily  Take  Carvedilol 9.375 mg twice a day   Do the following things EVERYDAY: 1) Weigh yourself in the morning before breakfast. Write it down and keep it in a log. 2) Take your medicines as prescribed 3) Eat low salt foods-Limit salt (sodium) to 2000 mg per day.  4) Stay as active as you can everyday 5) Limit all fluids for the day to less than 2 liters

## 2013-01-06 NOTE — Progress Notes (Signed)
Patient ID: [name], male   DOB: January 04, 1955, 58 y.o.   MRN: [id number]   Weight Range  188-193 pounds at home    Baseline proBNP    PCP: Dr Sigmund Hazel EP: Dr Graciela Husbands  Cardiologist: Dr Jens Som  HPI: [name] is a 58 y.o. male with a history of DM, HTN, HLD, ongoing tobacco abuse, CAD s/p 3 vessel stenting, ischemic cardiomyopathy, systolic heart failure (EF 20% on 09/2012 ECHO), stopped drinking 14 years ago former 1 case of beer per day, s/p ICD placement who was admitted on 12/03/12 with an acute exacerbation of his chronic systolic heart failure. Last LHC (6/06): EF 25%, pLAD stent 40-50, after stent 40, dLAD 20, pCFX 20, pOM1 70, pRCA 20, mRCA stents ok. Myoview (7/14): EF 26%, large ant, septal, inf and apical infarct, no ischemia. Carotid US (7/14): Bilateral 60-79% => f/u 6 mos. ABIs (9/13): normal on R and mod on L => seen by Dr. Kirke Corin and med Rx recommended.   Admitted to [facility] 12/21/4 for volume overload. Diuresed with IV lasix and transitioned to 80 mg lasix bid. Discharge weight 196 pounds  12/16/12 [facility] replacement    He returns for follow up. Last visit carvedilol was increased to 9.375 mg twice a day but he was unable to tolerate due to occasional headache. He says he has been taking carvedilol 6.25 mg three times a day and this was no problem. He has been taking carvedilol over the years at much higher doses and did not have any difficulty. He was also taking 25 mg spironolactone daily instead of 12.5 mg daily. He ran out of spironolactone last week.  Denies PND/Orthopnea. No shocks. Mild dyspnea with exertion and steps. Weight at home trending up 200 pounds. He has had an extra lasix 3-4 times over the last few weeks for weight > 194 pounds but he says he only takes extra lasix for lower extremity edema. Smokes 1/2 to 1PPD. Continues to work full time at [employer].    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 Smokes 1/2 PPD of cigarettes. Lives with his Mom. Works nights full time at [employer].  FH:  Father had MI   ROS: All systems negative except as listed in HPI, PMH and Problem List.  Past Medical History  Diagnosis Date  . Hyperlipidemia     mixed  . Chronic systolic heart failure   . ICD (implantable cardiac defibrillator), dual, in situ     St. Jude for severe LVD EF 25% 2/07 explanted 2010. Medtronic Virtuoso II DR Dual-chamber cardiverter-defibrillator  with pocket revision, Dr. Graciela Husbands  . Tobacco abuse   . Ischemic cardiomyopathy     a. Echo (09/27/12): EF 20%, diffuse HK with periapical AK, no LV thrombus noted, restrictive physiology, trivial MR, mild LAE, RVSF mildly reduced, PASP 39  . Cerebrovascular disease   . Diabetes mellitus   . HTN (hypertension)   . PVD (peripheral vascular disease)     a. ABI 09/2011 - R normal, L moderate - saw Dr. Kirke Corin - med rx.   . Carotid artery disease     a. Dopp 09/2011: 60-79% RICA, 40-59% LICA.;  b.  Carotid US (7/14):  Bilateral 60-79% => f/u 6 mos  . Coronary artery disease     a. AWMI requiring IABP 2005 s/p Horizon study stent-LAD, staged BMS-Cx, DESx2-RCA. b. Last Last  LHC (6/06):  EF 25%, pLAD stent 40-50, after stent 40, dLAD 20, pCFX 20, pOM1 70, pRCA 20, mRCA stents ok.=> med Rx;  c.  Myoview (7/14):  EF 26%, large ant, septal, inf and apical infarct, no ischemia.  Med Rx continued  . RIATA ICD Lead--on advisory recall      Current Outpatient Prescriptions  Medication Sig Dispense Refill  . aspirin 81 MG tablet Take 1 tablet (81 mg total) by mouth daily.  30 tablet  3  . atorvastatin (LIPITOR) 80 MG tablet Take 80 mg by mouth every other day.       . benazepril (LOTENSIN) 5 MG tablet Take 5 mg by mouth daily.      . carvedilol (COREG) 6.25 MG tablet Take 6.25 mg by mouth 3 (three) times daily.      Marland Kitchen ezetimibe (ZETIA) 10 MG tablet Take 10 mg by mouth daily.      . furosemide (LASIX) 80 MG tablet Take 1  tablet (80 mg total) by mouth 2 (two) times daily.  60 tablet  6  . glimepiride (AMARYL) 4 MG tablet Take 4 mg by mouth daily before breakfast.      . loratadine (CLARITIN) 10 MG tablet Take 10 mg by mouth daily.      . metFORMIN (GLUCOPHAGE-XR) 500 MG 24 hr tablet Take 500 mg by mouth 2 (two) times daily.      . polyethylene glycol (MIRALAX / GLYCOLAX) packet Take 17 g by mouth daily as needed.      . metolazone (ZAROXOLYN) 2.5 MG tablet Take 2.5 mg by mouth See admin instructions. Take 2.5 mg daily for 2 consecutive days (two tablets per week) if needed for excess fluid when LASIX does not work.      . potassium chloride SA (K-DUR,KLOR-CON) 10 MEQ tablet Take 10 meq for 2 consecutive days (TWO TABLETS PER WEEK WHEN TAKING METOLAZONE)  30 tablet  6  . spironolactone (ALDACTONE) 12.5 mg TABS tablet Take 0.5 tablets (12.5 mg total) by mouth daily.  30 tablet  6   No current facility-administered medications for this encounter.     PHYSICAL EXAM: Filed Vitals:   01/06/13 0852  BP: 100/62  Pulse: 98  Weight: 204 lb 6.4 oz (92.715 kg)  SpO2: 100%    General:  Chronically ill appearing. No resp difficulty HEENT: normal Neck: supple. JVP ~5  Carotids 2+ bilaterally; no bruits. No lymphadenopathy or thryomegaly appreciated. Cor: PMI normal. Regular rate & rhythm. No rubs, gallops or murmurs. Lungs: clear Abdomen: soft, nontender, nondistended. No hepatosplenomegaly. No bruits or masses. Good bowel sounds. Extremities: no cyanosis, clubbing, rash, edema. R and LLE chronic hyperpigmentation.  Neuro: alert & orientedx3, cranial nerves grossly intact. Moves all 4 extremities w/o difficulty. Affect pleasant.      ASSESSMENT & PLAN: 1. Chronic Systolic Heart Failure ICM, EF 20% 09/2012 has St Jude ICD. NYHA II.  Volume status stable. Continue lasix 80 mg twice a day. Previously instructed to take an extra 80 mg of lasix if your weight is 194 pounds or greater  however he is only taking extra  lasix for lower extremity edema.   He has been out of spironolactone for at least 7 days because he ran out as the result of taking  25 mg daily instead of instead of 12.5 mg daily. Today I will restart spirononontinue 12.5 mg spironolactone daily. Repeat BMET in 10 days.  Continue carvedilol 9.375 mg twice a day. He says he had headaches but  he is not sure if it is from carvedilol and after further discussion he has been on carvedilol at much higher doses for years therefore I will continue. I have asked him to bring all medications to his next appointment.  Continue benazepril 5 mg daily. Reinforced daily weights, low salt food choices, and limiting fluid intake to < 2 liters per day.   2. CAD - ICM S/P  3 vessel stenting . No evidence of ischemia. Continue aspirin and statin 3. Tobacco Abuse-Smokes 1/2-1  PPD. Declines smoking cessation. Encouraged to stop smoking.  4. DM - PCP referred to endocrinologist. On metformin and amaryl 4 mg daily.  5. Hyperlipidemia- continue statin   Follow up in 4 weeks and continue to titrate medications.    Shirla Hodgkiss NP-C  9:01 AM

## 2013-01-08 ENCOUNTER — Encounter: Payer: Self-pay | Admitting: Cardiovascular Disease

## 2013-01-08 ENCOUNTER — Ambulatory Visit (HOSPITAL_COMMUNITY): Payer: BC Managed Care – PPO | Attending: Cardiovascular Disease

## 2013-01-08 DIAGNOSIS — I6523 Occlusion and stenosis of bilateral carotid arteries: Secondary | ICD-10-CM

## 2013-01-08 DIAGNOSIS — E119 Type 2 diabetes mellitus without complications: Secondary | ICD-10-CM | POA: Insufficient documentation

## 2013-01-08 DIAGNOSIS — E785 Hyperlipidemia, unspecified: Secondary | ICD-10-CM | POA: Insufficient documentation

## 2013-01-08 DIAGNOSIS — I2510 Atherosclerotic heart disease of native coronary artery without angina pectoris: Secondary | ICD-10-CM | POA: Insufficient documentation

## 2013-01-08 DIAGNOSIS — I658 Occlusion and stenosis of other precerebral arteries: Secondary | ICD-10-CM | POA: Insufficient documentation

## 2013-01-08 DIAGNOSIS — F17200 Nicotine dependence, unspecified, uncomplicated: Secondary | ICD-10-CM | POA: Insufficient documentation

## 2013-01-08 DIAGNOSIS — I6529 Occlusion and stenosis of unspecified carotid artery: Secondary | ICD-10-CM

## 2013-01-08 DIAGNOSIS — I10 Essential (primary) hypertension: Secondary | ICD-10-CM | POA: Insufficient documentation

## 2013-01-17 ENCOUNTER — Other Ambulatory Visit (INDEPENDENT_AMBULATORY_CARE_PROVIDER_SITE_OTHER): Payer: BC Managed Care – PPO

## 2013-01-17 ENCOUNTER — Telehealth (HOSPITAL_COMMUNITY): Payer: Self-pay | Admitting: Cardiology

## 2013-01-17 ENCOUNTER — Encounter: Payer: Self-pay | Admitting: Internal Medicine

## 2013-01-17 DIAGNOSIS — I5022 Chronic systolic (congestive) heart failure: Secondary | ICD-10-CM

## 2013-01-17 DIAGNOSIS — I2510 Atherosclerotic heart disease of native coronary artery without angina pectoris: Secondary | ICD-10-CM

## 2013-01-17 DIAGNOSIS — Z95810 Presence of automatic (implantable) cardiac defibrillator: Secondary | ICD-10-CM

## 2013-01-17 DIAGNOSIS — I5023 Acute on chronic systolic (congestive) heart failure: Secondary | ICD-10-CM

## 2013-01-17 DIAGNOSIS — Z4502 Encounter for adjustment and management of automatic implantable cardiac defibrillator: Secondary | ICD-10-CM

## 2013-01-17 DIAGNOSIS — I255 Ischemic cardiomyopathy: Secondary | ICD-10-CM

## 2013-01-17 LAB — BASIC METABOLIC PANEL
BUN: 15 mg/dL (ref 6–23)
CHLORIDE: 96 meq/L (ref 96–112)
CO2: 30 mEq/L (ref 19–32)
CREATININE: 0.8 mg/dL (ref 0.4–1.5)
Calcium: 8.9 mg/dL (ref 8.4–10.5)
GFR: 105.44 mL/min (ref >60.00)
Glucose, Bld: 222 mg/dL — ABNORMAL HIGH (ref 70–99)
POTASSIUM: 3 meq/L — AB (ref 3.5–5.1)
Sodium: 135 mEq/L (ref 135–145)

## 2013-01-17 MED ORDER — POTASSIUM CHLORIDE CRYS ER 10 MEQ PO TBCR: EXTENDED_RELEASE_TABLET | ORAL | Status: DC

## 2013-01-17 NOTE — Telephone Encounter (Signed)
Message copied by JEFFRIES, Milagros ReapHANTEL M on Fri Jan 17, 2013  5:52 PM ------      Message from: Arvilla MeresBENSIMHON, DANIEL R      Created: Fri Jan 17, 2013  4:11 PM       Start kcl 20 daily. Take 2 tabs the first day ------

## 2013-01-17 NOTE — Telephone Encounter (Signed)
Left detailed message about increasing med New rx sent to pharm

## 2013-01-17 NOTE — Telephone Encounter (Signed)
Opened in error

## 2013-01-17 NOTE — Addendum Note (Signed)
Encounter addended by: Noralee SpaceHeather M Marcellous Snarski, RN on: 01/17/2013  8:51 AM<BR>     Documentation filed: Visit Diagnoses, Orders

## 2013-01-31 ENCOUNTER — Ambulatory Visit (HOSPITAL_COMMUNITY)
Admission: RE | Admit: 2013-01-31 | Discharge: 2013-01-31 | Disposition: A | Discharge status: Home / Self Care | Payer: BC Managed Care – PPO | Source: Ambulatory Visit | Attending: Internal Medicine | Admitting: Internal Medicine

## 2013-01-31 ENCOUNTER — Encounter (HOSPITAL_COMMUNITY): Payer: Self-pay

## 2013-01-31 VITALS — BP 101/72 | HR 98 | Resp 18 | Wt 213.8 lb

## 2013-01-31 DIAGNOSIS — I2510 Atherosclerotic heart disease of native coronary artery without angina pectoris: Secondary | ICD-10-CM

## 2013-01-31 DIAGNOSIS — I739 Peripheral vascular disease, unspecified: Secondary | ICD-10-CM | POA: Insufficient documentation

## 2013-01-31 DIAGNOSIS — F17200 Nicotine dependence, unspecified, uncomplicated: Secondary | ICD-10-CM | POA: Insufficient documentation

## 2013-01-31 DIAGNOSIS — I5022 Chronic systolic (congestive) heart failure: Secondary | ICD-10-CM | POA: Insufficient documentation

## 2013-01-31 LAB — LIPID PANEL
Cholesterol: 147 mg/dL (ref 0–200)
HDL: 31 mg/dL — AB (ref >39)
LDL CALC: 101 mg/dL — AB (ref 0–99)
TRIGLYCERIDES: 76 mg/dL (ref <150)
Total CHOL/HDL Ratio: 4.7 RATIO
VLDL: 15 mg/dL (ref 0–40)

## 2013-01-31 LAB — BASIC METABOLIC PANEL
BUN: 21 mg/dL (ref 6–23)
CO2: 24 mEq/L (ref 19–32)
Calcium: 8.8 mg/dL (ref 8.4–10.5)
Chloride: 93 mEq/L — ABNORMAL LOW (ref 96–112)
Creatinine, Ser: 0.72 mg/dL (ref 0.50–1.35)
GFR calc Af Amer: >90 mL/min (ref >90)
GFR calc non Af Amer: >90 mL/min (ref >90)
GLUCOSE: 286 mg/dL — AB (ref 70–99)
Potassium: 4 mEq/L (ref 3.7–5.3)
SODIUM: 131 meq/L — AB (ref 137–147)

## 2013-01-31 MED ORDER — SPIRONOLACTONE 25 MG PO TABS: 25.0000 mg | ORAL_TABLET | Freq: Every day | ORAL | Status: DC

## 2013-01-31 MED ORDER — TORSEMIDE 20 MG PO TABS: ORAL_TABLET | ORAL | Status: DC

## 2013-01-31 MED ORDER — CARVEDILOL 6.25 MG PO TABS: 9.3750 mg | ORAL_TABLET | Freq: Two times a day (BID) | ORAL | Status: DC

## 2013-01-31 NOTE — Progress Notes (Signed)
Patient ID: [name], male   DOB: June 23, 1955, 58 y.o.   MRN: [id number]   Weight Range  188-193 pounds at home    Baseline proBNP    PCP: Dr Sigmund Hazel EP: Dr Graciela Husbands  Cardiologist: Dr Jens Som  HPI: [name] is a 58 y.o. male with a history of DM, HTN, HLD, ongoing tobacco abuse, CAD s/p 3 vessel stenting, ischemic cardiomyopathy, systolic heart failure (EF 20% on 09/2012 ECHO), stopped drinking 14 years ago former 1 case of beer per day, s/p ICD placement who was admitted on 12/03/12 with an acute exacerbation of his chronic systolic heart failure. Last LHC (6/06): EF 25%, pLAD stent 40-50, after stent 40, dLAD 20, pCFX 20, pOM1 70, pRCA 20, mRCA stents ok. Myoview (7/14): EF 26%, large ant, septal, inf and apical infarct, no ischemia. Carotid US (7/14): Bilateral 60-79% => f/u 6 mos. ABIs (9/13): normal on R and mod on L => seen by Dr. Kirke Corin and med Rx recommended.   Admitted to [facility] 12/21/4 for volume overload. Diuresed with IV lasix and transitioned to 80 mg lasix bid. Discharge weight 196 pounds  12/16/12 St Jude Generator replacement    Follow up: Last visit restarted spironolactone 12.5 mg daily, repeat labs K+ low and started 20 meq daily. Ran out of lasix last week d/t he was taking extra and did not have enough pills. Has been taking carvedilol 6.25 mg TID instead of 9.375 mg BID. Denies SOB, CP or orthopnea. Mild dyspnea with activity in the past couple of days. Has needed extra lasix (total 320 mg daily) for a week. Took one dose of metolazone. Weight at home 195 lbs.  Up 9 lbs on our scales.  + edema. Smokes 1/2 PPD. Continues to work FT at [employer]. Reports having head cold right now. + dry cough two weeks. + leg weakness with walking.   Labs 12/02/12 Pro BNP 2717 Labs 12/16/12 K 3.9 Creatinine 0.86 Glucose 288 Labs 01/17/13: K+ 3.0, creatinine 0.8  SH: Former alcoholic- 1 case a day quit 14 years ago. Smokes 1/2 PPD of cigarettes. Lives with his Mom. Works nights full time at [employer].   FH:  Father had MI   ROS: All systems negative except as listed in HPI, PMH and Problem List.  Past Medical History  Diagnosis Date  . Hyperlipidemia     mixed  . Chronic systolic heart failure   . ICD (implantable cardiac defibrillator), dual, in situ     St. Jude for severe LVD EF 25% 2/07 explanted 2010. Medtronic Virtuoso II DR Dual-chamber cardiverter-defibrillator  with pocket revision, Dr. Graciela Husbands  . Tobacco abuse   . Ischemic cardiomyopathy     a. Echo (09/27/12): EF 20%, diffuse HK with periapical AK, no LV thrombus noted, restrictive physiology, trivial MR, mild LAE, RVSF mildly reduced, PASP 39  . Cerebrovascular disease   . Diabetes mellitus   . HTN (hypertension)   . PVD (peripheral vascular disease)     a. ABI 09/2011 - R normal, L moderate - saw Dr. Kirke Corin - med rx.   . Carotid artery disease     a. Dopp 09/2011: 60-79% RICA, 40-59% LICA.;  b.  Carotid US (7/14):  Bilateral 60-79% => f/u 6 mos  . Coronary artery disease     a. AWMI requiring IABP 2005 s/p Horizon study stent-LAD, staged BMS-Cx, DESx2-RCA. b. Last Last LHC (6/06):  EF 25%, pLAD stent 40-50, after stent 40, dLAD 20, pCFX 20, pOM1 70,  pRCA 20, mRCA stents ok.=> med Rx;  c.  Myoview (7/14):  EF 26%, large ant, septal, inf and apical infarct, no ischemia.  Med Rx continued  . RIATA ICD Lead--on advisory recall      Current Outpatient Prescriptions  Medication Sig Dispense Refill  . aspirin 81 MG tablet Take 1 tablet (81 mg total) by mouth daily.  30 tablet  3  . atorvastatin (LIPITOR) 80 MG tablet Take 80 mg by mouth every other day.       . benazepril (LOTENSIN) 5 MG tablet Take 5 mg by mouth daily.      . carvedilol (COREG) 6.25 MG tablet Take 6.25 mg by mouth 2 (two) times daily with a meal.      . ezetimibe (ZETIA) 10 MG tablet Take 10 mg by mouth daily.      . furosemide (LASIX) 80 MG tablet Take 1 tablet (80 mg total) by mouth 2 (two) times daily.  60 tablet  6  . glimepiride (AMARYL) 4 MG tablet  Take 4 mg by mouth daily before breakfast.      . loratadine (CLARITIN) 10 MG tablet Take 10 mg by mouth daily.      . metFORMIN (GLUCOPHAGE-XR) 500 MG 24 hr tablet Take 500 mg by mouth 2 (two) times daily.      . metolazone (ZAROXOLYN) 2.5 MG tablet Take 2.5 mg by mouth See admin instructions. Take 2.5 mg daily for 2 consecutive days (two tablets per week) if needed for excess fluid when LASIX does not work.      . polyethylene glycol (MIRALAX / GLYCOLAX) packet Take 17 g by mouth daily as needed.      . potassium chloride (K-DUR,KLOR-CON) 10 MEQ tablet Take two tabs daily  60 tablet  6  . spironolactone (ALDACTONE) 12.5 mg TABS tablet Take 0.5 tablets (12.5 mg total) by mouth daily.  30 tablet  6   No current facility-administered medications for this encounter.    Filed Vitals:   01/31/13 0901  BP: 101/72  Pulse: 98  Resp: 18  Weight: 213 lb 12 oz (96.956 kg)  SpO2: 100%    PHYSICAL EXAM: General:  Chronically ill appearing. No resp difficulty HEENT: normal Neck: supple. JVP ~8-9  Carotids 2+ bilaterally; no bruits. No lymphadenopathy or thryomegaly appreciated. Cor: PMI normal. Regular rate & rhythm. No rubs, gallops or murmurs. Lungs: clear Abdomen: soft, nontender, nondistended. No hepatosplenomegaly. No bruits or masses. Good bowel sounds. Extremities: no cyanosis, clubbing, rash, edema. R and LLE edema 2+ with chronic hyperpigmentation.  Neuro: alert & orientedx3, cranial nerves grossly intact. Moves all 4 extremities w/o difficulty. Affect pleasant.    ASSESSMENT & PLAN:  1. Chronic Systolic Heart Failure: ischemic cardiomyopathy, EF 20% 09/2012 echo.  Has St Jude ICD. NYHA III symptoms. He is volume overloaded on exam today.  - Will stop lasix and start torsemide 80 mg q am and 40 mg q pm. Check BMET today and in 2 weeks. - He has been taking coreg 6.25 mg TID d/t could not cut pills in half. Provided with pill cutter and asked to take 9.375 mg BID. - Will continue  spironolactone 25 mg daily (was prescribed 12.5 mg, however has been taking 25 mg and tolerating) - Continue benazepril 5 mg daily. - Reinforced the need and importance of daily weights, a low sodium diet, and fluid restriction (less than 2 L a day). Instructed to call the HF clinic if weight increases more than 3 lbs overnight  or 5 lbs in a week.  2. CAD - ICM S/P  3 vessel stenting. No chest pain. Continue aspirin and statin 3. Tobacco Abuse-Smokes 1/2-1  PPD. Declines smoking cessation. Encouraged to stop smoking and reports he is trying to quit.  4. Hyperlipidemia- Continue statin. Will check lipid profile today.  5. PVD- Still complaining of LE weakness with ambulation especially on left. ABIs (9/13): normal on R and mod on L => seen by Dr. Kirke Corin and med Rx recommended. Will repeat ABIs and reassess.  Follow up in 2 weeks  Ulla Potash B NP-C  9:13 AM  Patient is volume overloaded with NYHA class III symptoms. He is taking a lot of Lasix now.  - Will switch to torsemide 80 qam, 40 qpm with BMET today and in 2 wks  - Followup 2 wks.  - Will take Coreg 9.375 mg bid rather than 6.25 tid.  - ABIs today given significant claudication.   Marca Ancona 01/31/2013 11:58 AM

## 2013-01-31 NOTE — Patient Instructions (Signed)
Continue taking spironolactone 25 mg (1 tablet) daily.  Start taking coreg 9.375 mg (1 1/2 tablets) in the morning and 9.375 mg (1 1/2 tablets) in the evening.  Stop lasix.  Start taking torsemide 80 mg (4 tablets) in the morning and 40 mg (2 tablets) in the evening.  F/U 2 weeks  Do the following things EVERYDAY: 1) Weigh yourself in the morning before breakfast. Write it down and keep it in a log. 2) Take your medicines as prescribed 3) Eat low salt foods-Limit salt (sodium) to 2000 mg per day.  4) Stay as active as you can everyday 5) Limit all fluids for the day to less than 2 liters 6)

## 2013-02-13 ENCOUNTER — Encounter (HOSPITAL_COMMUNITY): Payer: Self-pay

## 2013-02-13 ENCOUNTER — Ambulatory Visit (HOSPITAL_COMMUNITY)
Admission: RE | Admit: 2013-02-13 | Discharge: 2013-02-13 | Disposition: A | Discharge status: Home / Self Care | Payer: BC Managed Care – PPO | Source: Ambulatory Visit | Attending: Internal Medicine | Admitting: Internal Medicine

## 2013-02-13 ENCOUNTER — Telehealth (HOSPITAL_COMMUNITY): Payer: Self-pay

## 2013-02-13 ENCOUNTER — Ambulatory Visit (HOSPITAL_BASED_OUTPATIENT_CLINIC_OR_DEPARTMENT_OTHER)
Admission: RE | Admit: 2013-02-13 | Discharge: 2013-02-13 | Disposition: A | Discharge status: Home / Self Care | Payer: BC Managed Care – PPO | Source: Ambulatory Visit | Attending: Internal Medicine | Admitting: Internal Medicine

## 2013-02-13 VITALS — BP 106/68 | HR 102 | Resp 18 | Wt 200.2 lb

## 2013-02-13 DIAGNOSIS — I70229 Atherosclerosis of native arteries of extremities with rest pain, unspecified extremity: Secondary | ICD-10-CM | POA: Insufficient documentation

## 2013-02-13 DIAGNOSIS — F17200 Nicotine dependence, unspecified, uncomplicated: Secondary | ICD-10-CM | POA: Insufficient documentation

## 2013-02-13 DIAGNOSIS — E785 Hyperlipidemia, unspecified: Secondary | ICD-10-CM | POA: Insufficient documentation

## 2013-02-13 DIAGNOSIS — Z7982 Long term (current) use of aspirin: Secondary | ICD-10-CM | POA: Insufficient documentation

## 2013-02-13 DIAGNOSIS — I2589 Other forms of chronic ischemic heart disease: Secondary | ICD-10-CM | POA: Insufficient documentation

## 2013-02-13 DIAGNOSIS — I2510 Atherosclerotic heart disease of native coronary artery without angina pectoris: Secondary | ICD-10-CM

## 2013-02-13 DIAGNOSIS — Z95810 Presence of automatic (implantable) cardiac defibrillator: Secondary | ICD-10-CM | POA: Insufficient documentation

## 2013-02-13 DIAGNOSIS — I5022 Chronic systolic (congestive) heart failure: Secondary | ICD-10-CM | POA: Insufficient documentation

## 2013-02-13 DIAGNOSIS — E119 Type 2 diabetes mellitus without complications: Secondary | ICD-10-CM | POA: Insufficient documentation

## 2013-02-13 DIAGNOSIS — I10 Essential (primary) hypertension: Secondary | ICD-10-CM | POA: Insufficient documentation

## 2013-02-13 DIAGNOSIS — M79609 Pain in unspecified limb: Secondary | ICD-10-CM | POA: Insufficient documentation

## 2013-02-13 DIAGNOSIS — Z79899 Other long term (current) drug therapy: Secondary | ICD-10-CM | POA: Insufficient documentation

## 2013-02-13 DIAGNOSIS — R0989 Other specified symptoms and signs involving the circulatory and respiratory systems: Secondary | ICD-10-CM

## 2013-02-13 DIAGNOSIS — I739 Peripheral vascular disease, unspecified: Secondary | ICD-10-CM

## 2013-02-13 LAB — BASIC METABOLIC PANEL
BUN: 18 mg/dL (ref 6–23)
CHLORIDE: 93 meq/L — AB (ref 96–112)
CO2: 28 meq/L (ref 19–32)
CREATININE: 0.71 mg/dL (ref 0.50–1.35)
Calcium: 9 mg/dL (ref 8.4–10.5)
GFR calc Af Amer: >90 mL/min (ref >90)
GFR calc non Af Amer: >90 mL/min (ref >90)
Glucose, Bld: 262 mg/dL — ABNORMAL HIGH (ref 70–99)
Potassium: 3.8 mEq/L (ref 3.7–5.3)
Sodium: 134 mEq/L — ABNORMAL LOW (ref 137–147)

## 2013-02-13 MED ORDER — CARVEDILOL 6.25 MG PO TABS: ORAL_TABLET | ORAL | Status: DC

## 2013-02-13 NOTE — Telephone Encounter (Signed)
Patient called and made aware of today's lab results from clinic visit.  Instructed to make PCP appointment to follow up with increased glucose levels.  Patient assures he will do so. [name], Myliyah Rebuck [name]

## 2013-02-13 NOTE — Progress Notes (Addendum)
Patient ID: [name], male   DOB: Apr 05, 1955, 58 y.o.   MRN: [id number]   Weight Range  188-193 pounds at home    Baseline proBNP    PCP: Dr Sigmund Hazel EP: Dr Graciela Husbands  Cardiologist: Dr Jens Som  HPI: [name] is a 58 y.o. male with a history of DM, HTN, HLD, ongoing tobacco abuse, CAD s/p 3 vessel stenting, ischemic cardiomyopathy, systolic heart failure (EF 20% on 09/2012 ECHO), stopped drinking 14 years ago former 1 case of beer per day, s/p ICD placement who was admitted on 12/03/12 with an acute exacerbation of his chronic systolic heart failure. Last LHC (6/06): EF 25%, pLAD stent 40-50, after stent 40, dLAD 20, pCFX 20, pOM1 70, pRCA 20, mRCA stents ok. Myoview (7/14): EF 26%, large ant, septal, inf and apical infarct, no ischemia. Carotid US (7/14): Bilateral 60-79% => f/u 6 mos. ABIs (9/13): normal on R and mod on L => seen by Dr. Kirke Corin and med Rx recommended.   Admitted to [facility] 12/21/4 for volume overload. Diuresed with IV lasix and transitioned to 80 mg lasix bid. Discharge weight 196 pounds  12/16/12 St Jude Generator replacement    Follow up: Last visit stopped lasix and switched to torsemide 80 mg q am 40 mg q pm. We also increased coreg to 9.375 mg BID. Does not feel the greatest, reports getting over pneumonia. + neuropathy pain. Denies SOB, orthopnea, CP or edema. Was able to walk from parking deck to clinic with no issues. Able to walk around [place] with no issues. Weight at home 185lbs. Has not needed any metolazone. Smokes 1/2 PPD. Continues to work FT at [employer].+ leg weakness with walking. Occasional dizziness at work.   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 Smokes 1/2 PPD of cigarettes. Lives with his Mom. Works nights full time at [employer].   FH:  Father had MI   ROS: All systems negative except as listed in  HPI, PMH and Problem List.  Past Medical History  Diagnosis Date  . Hyperlipidemia     mixed  . Chronic systolic heart failure   . ICD (implantable cardiac defibrillator), dual, in situ     St. Jude for severe LVD EF 25% 2/07 explanted 2010. Medtronic Virtuoso II DR Dual-chamber cardiverter-defibrillator  with pocket revision, Dr. Graciela Husbands  . Tobacco abuse   . Ischemic cardiomyopathy     a. Echo (09/27/12): EF 20%, diffuse HK with periapical AK, no LV thrombus noted, restrictive physiology, trivial MR, mild LAE, RVSF mildly reduced, PASP 39  . Cerebrovascular disease   . Diabetes mellitus   . HTN (hypertension)   . PVD (peripheral vascular disease)     a. ABI 09/2011 - R normal, L moderate - saw Dr. Kirke Corin - med rx.   . Carotid artery disease     a. Dopp 09/2011: 60-79% RICA, 40-59% LICA.;  b.  Carotid US (7/14):  Bilateral 60-79% => f/u 6 mos  . Coronary artery disease     a. AWMI requiring IABP 2005 s/p Horizon study stent-LAD, staged BMS-Cx, DESx2-RCA. b. Last Last LHC (6/06):  EF 25%, pLAD stent 40-50, after stent 40, dLAD 20, pCFX 20, pOM1 70, pRCA 20, mRCA stents ok.=> med Rx;  c.  Myoview (7/14):  EF 26%, large ant, septal, inf and apical  infarct, no ischemia.  Med Rx continued  . RIATA ICD Lead--on advisory recall      Current Outpatient Prescriptions  Medication Sig Dispense Refill  . aspirin 81 MG tablet Take 1 tablet (81 mg total) by mouth daily.  30 tablet  3  . atorvastatin (LIPITOR) 80 MG tablet Take 80 mg by mouth every other day.       . benazepril (LOTENSIN) 5 MG tablet Take 5 mg by mouth daily.      . carvedilol (COREG) 6.25 MG tablet Take 1.5 tablets (9.375 mg total) by mouth 2 (two) times daily with a meal.  90 tablet  3  . ezetimibe (ZETIA) 10 MG tablet Take 10 mg by mouth daily.      Marland Kitchen glimepiride (AMARYL) 4 MG tablet Take 4 mg by mouth daily before breakfast.      . loratadine (CLARITIN) 10 MG tablet Take 10 mg by mouth daily.      . metFORMIN (GLUCOPHAGE-XR) 500 MG  24 hr tablet Take 500 mg by mouth 2 (two) times daily.      . metolazone (ZAROXOLYN) 2.5 MG tablet Take 2.5 mg by mouth See admin instructions. Take 2.5 mg daily for 2 consecutive days (two tablets per week) if needed for excess fluid when LASIX does not work.      . polyethylene glycol (MIRALAX / GLYCOLAX) packet Take 17 g by mouth daily as needed.      . potassium chloride (K-DUR,KLOR-CON) 10 MEQ tablet Take two tabs daily  60 tablet  6  . spironolactone (ALDACTONE) 25 MG tablet Take 1 tablet (25 mg total) by mouth daily.  30 tablet  6  . torsemide (DEMADEX) 20 MG tablet Take 80 mg (4 tablets) in the morning and 40 mg (2 tablets) in the evening and extra tablet as needed when instructed by provider  195 tablet  3   No current facility-administered medications for this encounter.    Filed Vitals:   02/13/13 1112  BP: 106/68  Pulse: 102  Resp: 18  Weight: 200 lb 4 oz (90.833 kg)  SpO2: 100%    PHYSICAL EXAM: General:  Chronically ill appearing. No resp difficulty HEENT: normal Neck: supple. JVP ~7-8  Carotids 2+ bilaterally; no bruits. No lymphadenopathy or thryomegaly appreciated. Cor: PMI normal. Regular rate & rhythm. No rubs, gallops or murmurs. Lungs: clear Abdomen: soft, nontender, nondistended. No hepatosplenomegaly. No bruits or masses. Good bowel sounds. Extremities: no cyanosis, clubbing, rash, edema. No edema with chronic hyperpigmentation; cool lower extremities Neuro: alert & orientedx3, cranial nerves grossly intact. Moves all 4 extremities w/o difficulty. Affect pleasant.    ASSESSMENT & PLAN:  1. Chronic Systolic Heart Failure: ICM, EF 20% (09/2012) Has St Jude ICD.  - NYHA II symptoms and volume status much improved with switch to torsemide, he is down 13 lbs since last visit. Will check BMET. - Will increase coreg to 9.375 mg q am and 12.5 mg q pm. Instructed to call if he notices any dizziness with increase.  - Continue spironolactone 25 mg daily and benazepril 5  mg daily. - Reinforced the need and importance of daily weights, a low sodium diet, and fluid restriction (less than 2 L a day). Instructed to call the HF clinic if weight increases more than 3 lbs overnight or 5 lbs in a week.  2. CAD - ICM S/P  3 vessel stenting. No chest pain. Continue aspirin and statin 3. Tobacco Abuse-Smokes 1/2-1  PPD. Declines smoking cessation. Encouraged to  stop smoking and reports he is trying to quit.  4. HTN - Controlled on current medications.  Patient reports having issues with finances and coming back to appointments so frequently. Will F/U 2 months    Ulla Potashosgrove, Spyros Winch B NP-C  11:27 AM

## 2013-02-13 NOTE — Patient Instructions (Addendum)
Increase your coreg to 9.375 mg (1 1/2 tablets) in the morning and 12.5 mg (2 tablets) in the evening.  Follow up 2 months.  Call any issues [phone]  Do the following things EVERYDAY: 1) Weigh yourself in the morning before breakfast. Write it down and keep it in a log. 2) Take your medicines as prescribed 3) Eat low salt foods-Limit salt (sodium) to 2000 mg per day.  4) Stay as active as you can everyday 5) Limit all fluids for the day to less than 2 liters 6)

## 2013-02-13 NOTE — Progress Notes (Addendum)
VASCULAR LAB PRELIMINARY  ARTERIAL  ABI completed:    RIGHT    LEFT    PRESSURE WAVEFORM  PRESSURE WAVEFORM  BRACHIAL 112 Triphasic BRACHIAL 101 Triphasic  DP 69 Biphasic DP No audible   AT   AT 62 Biphasic  PT 87 Biphasic PT 85 Biphasic  GREAT TOE 0.21  GREAT TOE 0.14     RIGHT LEFT  ABI 0.78   TBI 0.59 0.76 TBI 0.38   ABI on the right indicates a mild reduction in arterial flow. There has been a decrease since study of 09/19/11 which was 0.98. Left ABI  Indicates a mild to moderate reduction in arterial flow which has increased since study of 09/19/11 which was 0.47. Great toe pressures indicate inadequate perfusion. Doppler signals were greatly reduced throughout probably secondary to coldness bilaterally of the feet even post attempt at warming . The coldness may adversely affect the results.   [name], RVS 02/13/2013, 11:01 AM

## 2013-02-13 NOTE — Telephone Encounter (Signed)
Message copied by Chyrl CivatteBRADLEY, MEGAN G on Thu Feb 13, 2013  4:35 PM ------      Message from: COSGROVE, ALI B      Created: Thu Feb 13, 2013  4:02 PM       Stable. Blood sugars elevated please tell to F/U with PCP. ------

## 2013-03-25 ENCOUNTER — Ambulatory Visit (INDEPENDENT_AMBULATORY_CARE_PROVIDER_SITE_OTHER): Payer: BC Managed Care – PPO | Admitting: Cardiology

## 2013-03-25 VITALS — BP 110/60 | HR 80

## 2013-03-25 DIAGNOSIS — I2510 Atherosclerotic heart disease of native coronary artery without angina pectoris: Secondary | ICD-10-CM

## 2013-03-25 DIAGNOSIS — I2589 Other forms of chronic ischemic heart disease: Secondary | ICD-10-CM

## 2013-03-25 DIAGNOSIS — I5022 Chronic systolic (congestive) heart failure: Secondary | ICD-10-CM

## 2013-03-25 DIAGNOSIS — Z95810 Presence of automatic (implantable) cardiac defibrillator: Secondary | ICD-10-CM

## 2013-03-25 DIAGNOSIS — I255 Ischemic cardiomyopathy: Secondary | ICD-10-CM

## 2013-03-25 LAB — MDC_IDC_ENUM_SESS_TYPE_INCLINIC
Battery Remaining Longevity: 94.8 mo
Brady Statistic RV Percent Paced: 0 %
HighPow Impedance: 57 Ohm
Lead Channel Impedance Value: 480 Ohm
Lead Channel Pacing Threshold Amplitude: 0.75 V
Lead Channel Pacing Threshold Pulse Width: 0.5 ms
Lead Channel Setting Pacing Pulse Width: 0.5 ms
Lead Channel Setting Sensing Sensitivity: 0.5 mV
MDC IDC MSMT LEADCHNL RV SENSING INTR AMPL: 11.7 mV
MDC IDC PG SERIAL: 7155171
MDC IDC SESS DTM: 20150324113609
MDC IDC SET LEADCHNL RV PACING AMPLITUDE: 2.5 V
MDC IDC SET ZONE DETECTION INTERVAL: 300 ms
Zone Setting Detection Interval: 250 ms

## 2013-03-25 NOTE — Progress Notes (Signed)
ELECTROPHYSIOLOGY OFFICE NOTE   Patient ID: [name]: [id number], DOB/AGE: Feb 28, 1955   Date of Visit: 03/25/2013  Primary Physician: Sigmund Hazel, MD Primary Cardiologist: Jens Som, MD Primary EP: Graciela Husbands, MD Reason for Visit: EP/device follow-up  History of Present Illness  [name] is a 58 y.o. male with an ischemic CM s/p ICD implant for primary prevention in 2007, chronic systolic HF, CAD s/p anterior MI and subsequent cardiogenic shock requiring IABP, PCI to LCx and RCA and PVD who presents today for routine electrophysiology followup. He underwent ICD generator change 3 months ago.  Since last being seen in our clinic, he reports he is in his usual state health and has no new complaints. He has chronic DOE but denies any worsening SOB. He denies chest pain. He denies palpitations, dizziness, near syncope or syncope. He denies LE swelling, orthopnea, PND or recent weight gain. He denies ICD shocks. He is compliant with medications; however, he admits to eating salty foods, canned vegetables and hot dogs.  Past Medical History Past Medical History  Diagnosis Date  . Hyperlipidemia     mixed  . Chronic systolic heart failure   . ICD (implantable cardiac defibrillator), dual, in situ     St. Jude for severe LVD EF 25% 2/07 explanted 2010. Medtronic Virtuoso II DR Dual-chamber cardiverter-defibrillator  with pocket revision, Dr. Graciela Husbands  . Tobacco abuse   . Ischemic cardiomyopathy     a. Echo (09/27/12): EF 20%, diffuse HK with periapical AK, no LV thrombus noted, restrictive physiology, trivial MR, mild LAE, RVSF mildly reduced, PASP 39  . Cerebrovascular disease   . Diabetes mellitus   . HTN (hypertension)   . PVD (peripheral vascular disease)     a. ABI 09/2011 - R normal, L moderate - saw Dr. Kirke Corin - med rx.   . Carotid artery disease     a. Dopp 09/2011: 60-79% RICA, 40-59% LICA.;  b.  Carotid US (7/14):  Bilateral 60-79% => f/u 6 mos  . Coronary artery disease     a.  AWMI requiring IABP 2005 s/p Horizon study stent-LAD, staged BMS-Cx, DESx2-RCA. b. Last Last LHC (6/06):  EF 25%, pLAD stent 40-50, after stent 40, dLAD 20, pCFX 20, pOM1 70, pRCA 20, mRCA stents ok.=> med Rx;  c.  Myoview (7/14):  EF 26%, large ant, septal, inf and apical infarct, no ischemia.  Med Rx continued  . RIATA ICD Lead--on advisory recall      Past Surgical History Past Surgical History  Procedure Laterality Date  . Medtronic virtuoso ii dr dual-chamber cardioverter-defibrillation with pocket revison      Dr. Sherryl Manges    Allergies/Intolerances Allergies  Allergen Reactions  . Niacin Itching and Other (See Comments)    Flushing     Current Home Medications Current Outpatient Prescriptions  Medication Sig Dispense Refill  . aspirin 81 MG tablet Take 1 tablet (81 mg total) by mouth daily.  30 tablet  3  . atorvastatin (LIPITOR) 80 MG tablet Take 80 mg by mouth every other day.       . benazepril (LOTENSIN) 5 MG tablet Take 5 mg by mouth daily.      . carvedilol (COREG) 6.25 MG tablet Take 9.375 mg (1 1/2 tablets) in the morning and 12.5 mg (2 tablets) in the evening.  105 tablet  3  . ezetimibe (ZETIA) 10 MG tablet Take 10 mg by mouth daily.      Marland Kitchen glimepiride (AMARYL) 4 MG tablet Take  4 mg by mouth daily before breakfast.      . loratadine (CLARITIN) 10 MG tablet Take 10 mg by mouth daily.      . metFORMIN (GLUCOPHAGE-XR) 500 MG 24 hr tablet Take 500 mg by mouth 2 (two) times daily.      . metolazone (ZAROXOLYN) 2.5 MG tablet Take 2.5 mg by mouth See admin instructions. Take 2.5 mg daily for 2 consecutive days (two tablets per week) if needed for excess fluid when LASIX does not work.      . polyethylene glycol (MIRALAX / GLYCOLAX) packet Take 17 g by mouth daily as needed.      . potassium chloride (K-DUR,KLOR-CON) 10 MEQ tablet Take two tabs daily  60 tablet  6  . spironolactone (ALDACTONE) 25 MG tablet Take 1 tablet (25 mg total) by mouth daily.  30 tablet  6  .  torsemide (DEMADEX) 20 MG tablet Take 80 mg (4 tablets) in the morning and 40 mg (2 tablets) in the evening and extra tablet as needed when instructed by provider  195 tablet  3   No current facility-administered medications for this visit.    Social History History   Social History  . Marital Status: Single    Spouse Name: N/A    Number of Children: N/A  . Years of Education: N/A   Occupational History  . [employer]    Full time   Social History Main Topics  . Smoking status: Current Some Day Smoker -- 1.00 packs/day for 33 years    Types: Cigarettes  . Smokeless tobacco: Never Used     Comment: smoker for 33 years  . Alcohol Use: No  . Drug Use: No     Comment: former Cocain, Acid, Marijuana - 25 years ago  . Sexual Activity: Not on file   Other Topics Concern  . Not on file   Social History Narrative   Divorced.     Review of Systems General: No chills, fever, night sweats or weight changes Cardiovascular: No chest pain, dyspnea on exertion, edema, orthopnea, palpitations, paroxysmal nocturnal dyspnea Dermatological: No rash, lesions or masses Respiratory: No cough, dyspnea Urologic: No hematuria, dysuria Abdominal: No nausea, vomiting, diarrhea, bright red blood per rectum, melena, or hematemesis Neurologic: No visual changes, weakness, changes in mental status All other systems reviewed and are otherwise negative except as noted above.  Physical Exam Vitals: Blood pressure 110/60, pulse 80.  General: Well developed, well appearing 58 y.o. male in no acute distress. HEENT: Normocephalic, atraumatic. EOMs intact. Sclera nonicteric. Oropharynx clear.  Neck: Supple. No JVD. Lungs: Respirations regular and unlabored, CTA bilaterally. No wheezes, rales or rhonchi. Heart: RRR. S1, S2 present. No murmurs, rub, S3 or S4. Abdomen: Soft, non-distended.  Extremities: No clubbing, cyanosis or edema. PT/Radials 2+ and equal bilaterally. Psych: Normal affect. Neuro:  Alert and oriented X 3. Moves all extremities spontaneously. Skin: ICD implant site intact and well healed.   Diagnostics  Echocardiogram Sept 2014 Study Conclusions - Left ventricle: The cavity size was mildly dilated. Wall thickness was normal. The estimated ejection fraction was 20%. Diffuse hypokinesis with peri-apical akinesis. Definity contrast was used. No LV thrombus noted. Doppler parameters are consistent with restrictive physiology, indicative of decreased left ventricular diastolic compliance and/or increased left atrial pressure. - Aortic valve: There was no stenosis. - Mitral valve: Trivial regurgitation. - Left atrium: The atrium was mildly dilated. - Right ventricle: Poorly visualized. The cavity size was normal. Systolic function was mildly reduced. -  Tricuspid valve: Peak RV-RA gradient: 29mm Hg (S). - Pulmonary arteries: PA peak pressure: 39mm Hg (S). - Systemic veins: IVC measured 2.4 cm with some respirophasic variation, suggesing RA pressure 10 mmHg. Impression: - Mildly dilated LV with EF 20%. Diffuse hypokinesis with peri-apical akinesis. No LV thrombus noted. Restrictive diastolic function. Normal RV size with mildly decreased systolic function (RV poorly visualized).  Device interrogation today - Normal device function. Thresholds and sensing consistent with previous device measurements. Impedance trends stable over time. 1 NST episode lasting 4 seconds, EGM shows monomorphic NSVT. Otherwise no evidence of any ventricular arrhythmias. Histogram distribution appropriate for patient and level of activity. No changes made this session. Device programmed at appropriate safety margins. Device programmed to optimize intrinsic conduction. Estimated longevity 7.9 - 8.3 years.   Assessment and Plan  1. Ischemic CM s/p ICD implant Normal device function No programming changes made Continue routine remote ICD follow-up every 3 months Return for follow-up with Dr.  Graciela Husbands in Dec 2015  2. Chronic systolic HF No worsening HF symptoms Continue guideline-directed medical therapy with Coreg and ACEI He was counseled regarding the importance of low sodium diet and daily weights  3. CAD No anginal symptoms Continue medical therapy He was counseled regarding the importance of smoking cessation  Signed, Shaniquia Brafford, PA-C 03/25/2013, 2:16 PM

## 2013-03-27 ENCOUNTER — Ambulatory Visit: Payer: BC Managed Care – PPO | Admitting: Cardiology

## 2013-03-28 ENCOUNTER — Other Ambulatory Visit: Payer: Self-pay | Admitting: Family Medicine

## 2013-03-28 DIAGNOSIS — R109 Unspecified abdominal pain: Secondary | ICD-10-CM

## 2013-03-30 ENCOUNTER — Encounter: Payer: Self-pay | Admitting: Cardiology

## 2013-04-03 ENCOUNTER — Ambulatory Visit
Admission: RE | Admit: 2013-04-03 | Discharge: 2013-04-03 | Disposition: A | Discharge status: Home / Self Care | Payer: BC Managed Care – PPO | Source: Ambulatory Visit | Attending: Family Medicine | Admitting: Family Medicine

## 2013-04-03 DIAGNOSIS — R109 Unspecified abdominal pain: Secondary | ICD-10-CM

## 2013-04-03 MED ORDER — IOHEXOL 350 MG/ML SOLN
100.0000 mL | Freq: Once | INTRAVENOUS | Status: AC | PRN
  Administered 2013-04-03: 100 mL via INTRAVENOUS

## 2013-04-11 ENCOUNTER — Other Ambulatory Visit (HOSPITAL_COMMUNITY): Payer: Self-pay

## 2013-04-11 ENCOUNTER — Encounter (HOSPITAL_COMMUNITY): Payer: Self-pay

## 2013-04-11 DIAGNOSIS — I509 Heart failure, unspecified: Secondary | ICD-10-CM

## 2013-04-21 ENCOUNTER — Encounter: Payer: Self-pay | Admitting: Internal Medicine

## 2013-04-29 ENCOUNTER — Ambulatory Visit (INDEPENDENT_AMBULATORY_CARE_PROVIDER_SITE_OTHER): Payer: BC Managed Care – PPO | Admitting: Cardiology

## 2013-04-29 ENCOUNTER — Encounter: Payer: Self-pay | Admitting: Cardiology

## 2013-04-29 VITALS — BP 123/83 | HR 101 | Ht 72.0 in | Wt 200.1 lb

## 2013-04-29 DIAGNOSIS — E785 Hyperlipidemia, unspecified: Secondary | ICD-10-CM

## 2013-04-29 DIAGNOSIS — I10 Essential (primary) hypertension: Secondary | ICD-10-CM

## 2013-04-29 DIAGNOSIS — I2589 Other forms of chronic ischemic heart disease: Secondary | ICD-10-CM

## 2013-04-29 DIAGNOSIS — I2510 Atherosclerotic heart disease of native coronary artery without angina pectoris: Secondary | ICD-10-CM

## 2013-04-29 DIAGNOSIS — Z95810 Presence of automatic (implantable) cardiac defibrillator: Secondary | ICD-10-CM

## 2013-04-29 DIAGNOSIS — I5022 Chronic systolic (congestive) heart failure: Secondary | ICD-10-CM

## 2013-04-29 DIAGNOSIS — I679 Cerebrovascular disease, unspecified: Secondary | ICD-10-CM

## 2013-04-29 LAB — BASIC METABOLIC PANEL
BUN: 13 mg/dL (ref 6–23)
CHLORIDE: 93 meq/L — AB (ref 96–112)
CO2: 27 mEq/L (ref 19–32)
Calcium: 9 mg/dL (ref 8.4–10.5)
Creatinine, Ser: 0.9 mg/dL (ref 0.4–1.5)
GFR: 96.9 mL/min (ref >60.00)
GLUCOSE: 478 mg/dL — AB (ref 70–99)
POTASSIUM: 3.9 meq/L (ref 3.5–5.1)
Sodium: 131 mEq/L — ABNORMAL LOW (ref 135–145)

## 2013-04-29 NOTE — Progress Notes (Signed)
HPI: FU CAD and CHF. S/p ant MI in 2005 req IABP tx with Horizon study stent to the LAD followed by staged BMS to the CFX and DES x 2 to RCA, Ischemic CM, systolic CHF, s/p AICD, HTN, DM2, HL, carotid stenosis, PAD. Last LHC (6/06): EF 25%, pLAD stent 40-50, after stent 40, dLAD 20, pCFX 20, pOM1 70, pRCA 20, mRCA stents ok. Abdominal US (2/11): No AAA. Myoview (7/14): EF 26%, large ant, septal, inf and apical infarct, no ischemia. Med Rx continued. Carotid US (1/15): Bilateral 60-79% => f/u 6 mos. ABIs February 2015 showed mild reduction on the right and mild to moderate reduction on the left. Waveform analysis suggests moderate to severe disease bilaterally. Echocardiogram September 2014 showed an ejection fraction of 20%. There was restrictive physiology. He also is followed now in congestive heart failure clinic. Since last seen, He has some dyspnea on exertion but no chest pain, palpitations, syncope or increased pedal edema.   Current Outpatient Prescriptions  Medication Sig Dispense Refill  . aspirin 81 MG tablet Take 1 tablet (81 mg total) by mouth daily.  30 tablet  3  . atorvastatin (LIPITOR) 80 MG tablet Take 80 mg by mouth every other day.       . benazepril (LOTENSIN) 5 MG tablet Take 5 mg by mouth daily.      . carvedilol (COREG) 6.25 MG tablet Take 9.375 mg (1 1/2 tablets) in the morning and 12.5 mg (2 tablets) in the evening.  105 tablet  3  . ezetimibe (ZETIA) 10 MG tablet Take 10 mg by mouth daily.      Marland Kitchen. glimepiride (AMARYL) 4 MG tablet Take 4 mg by mouth daily before breakfast.      . loratadine (CLARITIN) 10 MG tablet Take 10 mg by mouth daily.      . metFORMIN (GLUCOPHAGE-XR) 500 MG 24 hr tablet Take 500 mg by mouth 2 (two) times daily.      . metolazone (ZAROXOLYN) 2.5 MG tablet Take 2.5 mg by mouth See admin instructions. Take 2.5 mg daily for 2 consecutive days (two tablets per week) if needed for excess fluid when LASIX does not work.      . polyethylene glycol  (MIRALAX / GLYCOLAX) packet Take 17 g by mouth daily as needed.      . potassium chloride (K-DUR,KLOR-CON) 10 MEQ tablet Take two tabs daily  60 tablet  6  . spironolactone (ALDACTONE) 25 MG tablet Take 1 tablet (25 mg total) by mouth daily.  30 tablet  6  . torsemide (DEMADEX) 20 MG tablet Take 80 mg (4 tablets) in the morning and 40 mg (2 tablets) in the evening and extra tablet as needed when instructed by provider  195 tablet  3   No current facility-administered medications for this visit.     Past Medical History  Diagnosis Date  . Hyperlipidemia     mixed  . Chronic systolic heart failure   . ICD (implantable cardiac defibrillator), dual, in situ     St. Jude for severe LVD EF 25% 2/07 explanted 2010. Medtronic Virtuoso II DR Dual-chamber cardiverter-defibrillator  with pocket revision, Dr. Graciela HusbandsKlein  . Tobacco abuse   . Ischemic cardiomyopathy     a. Echo (09/27/12): EF 20%, diffuse HK with periapical AK, no LV thrombus noted, restrictive physiology, trivial MR, mild LAE, RVSF mildly reduced, PASP 39  . Cerebrovascular disease   . Diabetes mellitus   . HTN (hypertension)   .  PVD (peripheral vascular disease)     a. ABI 09/2011 - R normal, L moderate - saw Dr. Kirke CorinArida - med rx.   . Carotid artery disease     a. Dopp 09/2011: 60-79% RICA, 40-59% LICA.;  b.  Carotid US (7/14):  Bilateral 60-79% => f/u 6 mos  . Coronary artery disease     a. AWMI requiring IABP 2005 s/p Horizon study stent-LAD, staged BMS-Cx, DESx2-RCA. b. Last Last LHC (6/06):  EF 25%, pLAD stent 40-50, after stent 40, dLAD 20, pCFX 20, pOM1 70, pRCA 20, mRCA stents ok.=> med Rx;  c.  Myoview (7/14):  EF 26%, large ant, septal, inf and apical infarct, no ischemia.  Med Rx continued  . RIATA ICD Lead--on advisory recall      Past Surgical History  Procedure Laterality Date  . Medtronic virtuoso ii dr dual-chamber cardioverter-defibrillation with pocket revison      Dr. Sherryl MangesSteven Klein    History   Social History  .  Marital Status: Single    Spouse Name: N/A    Number of Children: N/A  . Years of Education: N/A   Occupational History  . [employer]    Full time   Social History Main Topics  . Smoking status: Current Some Day Smoker -- 1.00 packs/day for 33 years    Types: Cigarettes  . Smokeless tobacco: Never Used     Comment: smoker for 33 years  . Alcohol Use: No  . Drug Use: No     Comment: former Cocain, Acid, Marijuana - 25 years ago  . Sexual Activity: Not on file   Other Topics Concern  . Not on file   Social History Narrative   Divorced.    ROS: no fevers or chills, productive cough, hemoptysis, dysphasia, odynophagia, melena, hematochezia, dysuria, hematuria, rash, seizure activity, orthopnea, PND, claudication. Remaining systems are negative.  Physical Exam: Well-developed well-nourished in no acute distress.  Skin is warm and dry.  HEENT is normal.  Neck is supple.  Chest is clear to auscultation with normal expansion.  Cardiovascular exam is regular rate and rhythm.  Abdominal exam nontender or distended. No masses palpated. Extremities show Chronic skin changes and trace edema. neuro grossly intact  ECG Sinus tachycardia at a rate of 101. Prior anterior lateral infarct.

## 2013-04-29 NOTE — Assessment & Plan Note (Signed)
Patient counseled on discontinuing. 

## 2013-04-29 NOTE — Assessment & Plan Note (Signed)
Blood pressure controlled. Continue present medications. 

## 2013-04-29 NOTE — Assessment & Plan Note (Signed)
Continue present dose of diuretics. Check potassium and renal function. 

## 2013-04-29 NOTE — Assessment & Plan Note (Signed)
Continue statin. 

## 2013-04-29 NOTE — Patient Instructions (Signed)
Your physician wants you to follow-up in: 6 MONTHS WITH DR Jens SomRENSHAW You will receive a reminder letter in the mail two months in advance. If you don't receive a letter, please call our office to schedule the follow-up appointment.   Your physician has requested that you have a carotid duplex. This test is an ultrasound of the carotid arteries in your neck. It looks at blood flow through these arteries that supply the brain with blood. Allow one hour for this exam. There are no restrictions or special instructions.DUE IN July  Your physician recommends that you HAVE LAB WORK TODAY

## 2013-04-29 NOTE — Assessment & Plan Note (Signed)
Followed by electrophysiology. 

## 2013-04-29 NOTE — Assessment & Plan Note (Signed)
Continue present dose of ACE inhibitor and beta blocker.

## 2013-04-29 NOTE — Assessment & Plan Note (Signed)
Continue aspirin and statin. Followup carotid Dopplers July 2015. 

## 2013-04-29 NOTE — Assessment & Plan Note (Signed)
Continue aspirin and statin. 

## 2013-04-29 NOTE — Assessment & Plan Note (Signed)
Continue aspirin and statin. May need followup with Dr Kirke CorinArida in the future.

## 2013-05-27 ENCOUNTER — Encounter (HOSPITAL_COMMUNITY): Payer: BC Managed Care – PPO

## 2013-05-27 ENCOUNTER — Other Ambulatory Visit (HOSPITAL_COMMUNITY): Payer: BC Managed Care – PPO

## 2013-05-28 ENCOUNTER — Ambulatory Visit (HOSPITAL_COMMUNITY)
Admission: RE | Admit: 2013-05-28 | Discharge: 2013-05-28 | Disposition: A | Discharge status: Home / Self Care | Payer: BC Managed Care – PPO | Source: Ambulatory Visit | Attending: Family Medicine | Admitting: Family Medicine

## 2013-05-28 ENCOUNTER — Ambulatory Visit (HOSPITAL_BASED_OUTPATIENT_CLINIC_OR_DEPARTMENT_OTHER)
Admission: RE | Admit: 2013-05-28 | Discharge: 2013-05-28 | Disposition: A | Discharge status: Home / Self Care | Payer: BC Managed Care – PPO | Source: Ambulatory Visit | Attending: Family Medicine | Admitting: Family Medicine

## 2013-05-28 VITALS — BP 108/68 | HR 99 | Wt 193.2 lb

## 2013-05-28 DIAGNOSIS — I5023 Acute on chronic systolic (congestive) heart failure: Secondary | ICD-10-CM

## 2013-05-28 DIAGNOSIS — I5022 Chronic systolic (congestive) heart failure: Secondary | ICD-10-CM

## 2013-05-28 DIAGNOSIS — I2510 Atherosclerotic heart disease of native coronary artery without angina pectoris: Secondary | ICD-10-CM

## 2013-05-28 DIAGNOSIS — I059 Rheumatic mitral valve disease, unspecified: Secondary | ICD-10-CM

## 2013-05-28 DIAGNOSIS — F17200 Nicotine dependence, unspecified, uncomplicated: Secondary | ICD-10-CM

## 2013-05-28 DIAGNOSIS — I509 Heart failure, unspecified: Secondary | ICD-10-CM | POA: Insufficient documentation

## 2013-05-28 LAB — BASIC METABOLIC PANEL
BUN: 19 mg/dL (ref 6–23)
CALCIUM: 9.7 mg/dL (ref 8.4–10.5)
CO2: 28 meq/L (ref 19–32)
CREATININE: 0.75 mg/dL (ref 0.50–1.35)
Chloride: 94 mEq/L — ABNORMAL LOW (ref 96–112)
GFR calc Af Amer: >90 mL/min (ref >90)
GFR calc non Af Amer: >90 mL/min (ref >90)
GLUCOSE: 83 mg/dL (ref 70–99)
Potassium: 3.6 mEq/L — ABNORMAL LOW (ref 3.7–5.3)
Sodium: 137 mEq/L (ref 137–147)

## 2013-05-28 LAB — PRO B NATRIURETIC PEPTIDE: Pro B Natriuretic peptide (BNP): 1463 pg/mL — ABNORMAL HIGH (ref 0–125)

## 2013-05-28 MED ORDER — CARVEDILOL 12.5 MG PO TABS: 12.5000 mg | ORAL_TABLET | Freq: Two times a day (BID) | ORAL | Status: DC

## 2013-05-28 NOTE — Addendum Note (Signed)
Encounter addended by: Theresia Bough, CMA on: 05/28/2013 10:32 AM<BR>     Documentation filed: Visit Diagnoses, Patient Instructions Section, Orders

## 2013-05-28 NOTE — Progress Notes (Signed)
*  PRELIMINARY RESULTS* Echocardiogram 2D Echocardiogram has been performed.  [name] 05/28/2013, 8:59 AM

## 2013-05-28 NOTE — Progress Notes (Signed)
Patient ID: [name], male   DOB: 07/09/1955, 58 y.o.   MRN: [id number]   Weight Range  188-193 pounds at home    Baseline proBNP    PCP: Dr Sigmund Hazel EP: Dr Graciela Husbands  Cardiologist: Dr Jens Som  HPI: [name] is a 58 y.o. male with a history of DM, HTN, HLD, ongoing tobacco abuse, CAD s/p 3 vessel stenting, ischemic cardiomyopathy, systolic heart failure (EF 20% on 09/2012; 25% on 5/15), stopped drinking 14 years ago, s/p ICD placement.  Admitted on 12/03/12 with an acute exacerbation of his chronic systolic heart failure. Last LHC (6/06): EF 25%, pLAD stent 40-50, after stent 40, dLAD 20, pCFX 20, pOM1 70, pRCA 20, mRCA stents ok. Myoview (7/14): EF 26%, large ant, septal, inf and apical infarct, no ischemia. Carotid US (7/14): Bilateral 60-79% => f/u 6 mos. ABIs (9/13): normal on R and mod on L => seen by Dr. Kirke Corin and med Rx recommended.   Admitted to [facility] 12/21/4 for volume overload. Diuresed with IV lasix and transitioned to 80 mg lasix bid. Discharge weight 196 pounds  12/16/12 St Jude Generator replacement    Follow up: At last visit carvedilol increased. 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 Weighs himself about every other day. Weight at home 193-194. Stable. Takes extra lasix as needed.   Denies SOB, orthopnea, CP or edema. Main complaint is muscle aches. Echo today reviewed personally EF 25% RV ok.   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 Smokes 1/2 PPD of cigarettes. Lives with his Mom. Works nights full time at [employer].   FH:  Father had MI   ROS: All systems negative except as listed in HPI, PMH and Problem List.  Past Medical History   Diagnosis Date  . Hyperlipidemia     mixed  . Chronic systolic heart failure   . ICD (implantable cardiac defibrillator), dual, in situ     St. Jude for severe LVD EF 25% 2/07 explanted 2010. Medtronic Virtuoso II DR Dual-chamber cardiverter-defibrillator  with pocket revision, Dr. Graciela Husbands  . Tobacco abuse   . Ischemic cardiomyopathy     a. Echo (09/27/12): EF 20%, diffuse HK with periapical AK, no LV thrombus noted, restrictive physiology, trivial MR, mild LAE, RVSF mildly reduced, PASP 39  . Cerebrovascular disease   . Diabetes mellitus   . HTN (hypertension)   . PVD (peripheral vascular disease)     a. ABI 09/2011 - R normal, L moderate - saw Dr. Kirke Corin - med rx.   . Carotid artery disease     a. Dopp 09/2011: 60-79% RICA, 40-59% LICA.;  b.  Carotid US (7/14):  Bilateral 60-79% => f/u 6 mos  . Coronary artery disease     a. AWMI requiring IABP 2005 s/p Horizon study stent-LAD, staged BMS-Cx, DESx2-RCA. b. Last Last LHC (6/06):  EF 25%, pLAD stent 40-50, after stent 40, dLAD 20, pCFX 20, pOM1 70, pRCA 20, mRCA stents ok.=> med Rx;  c.  Myoview (7/14):  EF 26%, large ant, septal, inf and apical infarct, no ischemia.  Med Rx continued  . RIATA ICD Lead--on advisory recall  Current Outpatient Prescriptions  Medication Sig Dispense Refill  . aspirin 81 MG tablet Take 1 tablet (81 mg total) by mouth daily.  30 tablet  3  . atorvastatin (LIPITOR) 80 MG tablet Take 80 mg by mouth every other day.       . benazepril (LOTENSIN) 5 MG tablet Take 5 mg by mouth daily.      . carvedilol (COREG) 6.25 MG tablet Take 9.375 mg (1 1/2 tablets) in the morning and 12.5 mg (2 tablets) in the evening.  105 tablet  3  . chlorzoxazone (PARAFON) 500 MG tablet       . ezetimibe (ZETIA) 10 MG tablet Take 10 mg by mouth daily.      Marland Kitchen. glimepiride (AMARYL) 4 MG tablet Take 4 mg by mouth daily before breakfast.      . loratadine (CLARITIN) 10 MG tablet Take 10 mg by mouth daily.      . metFORMIN (GLUCOPHAGE-XR)  500 MG 24 hr tablet Take 500 mg by mouth 2 (two) times daily.      . metolazone (ZAROXOLYN) 2.5 MG tablet Take 2.5 mg by mouth See admin instructions. Take 2.5 mg daily for 2 consecutive days (two tablets per week) if needed for excess fluid when LASIX does not work.      Marland Kitchen. NOVOLIN N RELION 100 UNIT/ML injection       . polyethylene glycol (MIRALAX / GLYCOLAX) packet Take 17 g by mouth daily as needed.      . potassium chloride (K-DUR,KLOR-CON) 10 MEQ tablet Take two tabs daily  60 tablet  6  . spironolactone (ALDACTONE) 25 MG tablet Take 1 tablet (25 mg total) by mouth daily.  30 tablet  6  . torsemide (DEMADEX) 20 MG tablet Take 80 mg (4 tablets) in the morning and 40 mg (2 tablets) in the evening and extra tablet as needed when instructed by provider  195 tablet  3   No current facility-administered medications for this encounter.    Filed Vitals:   05/28/13 0911  BP: 108/68  Pulse: 99  Weight: 193 lb 4 oz (87.658 kg)  SpO2: 96%    PHYSICAL EXAM: General:  Walks without difficulty.. No resp difficulty HEENT: normal Neck: supple. JVP ~7  Carotids 2+ bilaterally; no bruits. No lymphadenopathy or thryomegaly appreciated. Cor: PMI normal. Regular rate & rhythm. No rubs, gallops or murmurs. Lungs: clear Abdomen: soft, nontender, nondistended. No hepatosplenomegaly. No bruits or masses. Good bowel sounds. Extremities: no cyanosis, clubbing, rash, edema. No edema with chronic hyperpigmentation; Neuro: alert & orientedx3, cranial nerves grossly intact. Moves all 4 extremities w/o difficulty. Affect pleasant.    ASSESSMENT & PLAN:  1. Chronic Systolic Heart Failure: ICM, EF 20% (09/2012) EF 25% echo 5/15. Has St Jude ICD.  - Doing ok. NYHA II symptoms and volume status ok despite his own switch back to lasix. He says he will go pick up torsemide this week and switch back.  - Will increase coreg to 12.5 mg bid - Continue spironolactone 25 mg daily and benazepril 5 mg daily. - Reinforced  the need and importance of daily weights, a low sodium diet, and fluid restriction (less than 2 L a day). Instructed to call the HF clinic if weight increases more than 3 lbs overnight or 5 lbs in a week.  - BMET today 2. CAD - ICM S/P  3 vessel stenting. No chest pain. Continue aspirin and statin. Follows with Dr. Jens Somrenshaw 3. Tobacco Abuse-Smokes 1/2-1  PPD. Encouraged to  stop smoking. Declines smoking cessation.  4. HTN - Controlled on current medications.  Will F/U 2 months    Dolores Patty MD 10:03 AM

## 2013-05-28 NOTE — Patient Instructions (Signed)
INCREASE Coreg to 12.5mg  twice a day  Labs today  Your physician recommends that you schedule a follow-up appointment in: 2 months  Do the following things EVERYDAY: 1) Weigh yourself in the morning before breakfast. Write it down and keep it in a log. 2) Take your medicines as prescribed 3) Eat low salt foods-Limit salt (sodium) to 2000 mg per day.  4) Stay as active as you can everyday 5) Limit all fluids for the day to less than 2 liters 6)

## 2013-05-30 ENCOUNTER — Other Ambulatory Visit: Payer: Self-pay

## 2013-06-06 ENCOUNTER — Other Ambulatory Visit: Payer: Self-pay | Admitting: Gastroenterology

## 2013-06-06 DIAGNOSIS — R1011 Right upper quadrant pain: Secondary | ICD-10-CM

## 2013-06-12 ENCOUNTER — Ambulatory Visit
Admission: RE | Admit: 2013-06-12 | Discharge: 2013-06-12 | Disposition: A | Discharge status: Home / Self Care | Payer: BC Managed Care – PPO | Source: Ambulatory Visit | Attending: Gastroenterology | Admitting: Gastroenterology

## 2013-06-12 DIAGNOSIS — R1011 Right upper quadrant pain: Secondary | ICD-10-CM

## 2013-06-13 ENCOUNTER — Other Ambulatory Visit: Payer: Self-pay | Admitting: Gastroenterology

## 2013-06-13 ENCOUNTER — Ambulatory Visit
Admission: RE | Admit: 2013-06-13 | Discharge: 2013-06-13 | Disposition: A | Discharge status: Home / Self Care | Payer: BC Managed Care – PPO | Source: Ambulatory Visit | Attending: Gastroenterology | Admitting: Gastroenterology

## 2013-06-13 DIAGNOSIS — J90 Pleural effusion, not elsewhere classified: Secondary | ICD-10-CM

## 2013-06-26 ENCOUNTER — Ambulatory Visit (INDEPENDENT_AMBULATORY_CARE_PROVIDER_SITE_OTHER): Payer: BC Managed Care – PPO | Admitting: *Deleted

## 2013-06-26 DIAGNOSIS — I2589 Other forms of chronic ischemic heart disease: Secondary | ICD-10-CM

## 2013-06-26 DIAGNOSIS — I5022 Chronic systolic (congestive) heart failure: Secondary | ICD-10-CM

## 2013-06-26 DIAGNOSIS — I5023 Acute on chronic systolic (congestive) heart failure: Secondary | ICD-10-CM

## 2013-06-26 LAB — MDC_IDC_ENUM_SESS_TYPE_INCLINIC
Battery Remaining Longevity: 93.6 mo
Brady Statistic RV Percent Paced: 0 %
Date Time Interrogation Session: 20150625093839
HighPow Impedance: 55.0101
Lead Channel Pacing Threshold Pulse Width: 0.5 ms
Lead Channel Sensing Intrinsic Amplitude: 11.7 mV
Lead Channel Setting Sensing Sensitivity: 0.5 mV
MDC IDC MSMT LEADCHNL RV IMPEDANCE VALUE: 475 Ohm
MDC IDC MSMT LEADCHNL RV PACING THRESHOLD AMPLITUDE: 0.75 V
MDC IDC MSMT LEADCHNL RV PACING THRESHOLD AMPLITUDE: 0.75 V
MDC IDC MSMT LEADCHNL RV PACING THRESHOLD PULSEWIDTH: 0.5 ms
MDC IDC PG SERIAL: 7155171
MDC IDC SET LEADCHNL RV PACING AMPLITUDE: 2.5 V
MDC IDC SET LEADCHNL RV PACING PULSEWIDTH: 0.5 ms
Zone Setting Detection Interval: 250 ms
Zone Setting Detection Interval: 300 ms

## 2013-06-26 NOTE — Progress Notes (Signed)
ICD check in clinic. Normal device function. Threshold and sensing consistent with previous device measurements. Impedance trends stable over time. No evidence of any ventricular arrhythmias. Histogram distribution appropriate for patient and level of activity. Stable thoracic impedance at present---abn x 8 days towards end of May. No changes made this session. Device programmed at appropriate safety margins. Device programmed to optimize intrinsic conduction. Estimated longevity 7.8-8.1 years. Plan to follow up with the Device Clinic on 10-1 and with SK in 12-2013.

## 2013-07-03 ENCOUNTER — Telehealth (HOSPITAL_COMMUNITY): Payer: Self-pay | Admitting: Vascular Surgery

## 2013-07-03 NOTE — Telephone Encounter (Signed)
Left message for 2 month f/u

## 2013-07-21 ENCOUNTER — Encounter: Payer: Self-pay | Admitting: Internal Medicine

## 2013-07-31 ENCOUNTER — Other Ambulatory Visit (HOSPITAL_COMMUNITY): Payer: BC Managed Care – PPO

## 2013-08-01 ENCOUNTER — Encounter (HOSPITAL_COMMUNITY): Payer: BC Managed Care – PPO

## 2013-08-01 ENCOUNTER — Ambulatory Visit (HOSPITAL_COMMUNITY): Payer: BC Managed Care – PPO | Attending: Cardiology | Admitting: *Deleted

## 2013-08-01 DIAGNOSIS — I6529 Occlusion and stenosis of unspecified carotid artery: Secondary | ICD-10-CM | POA: Insufficient documentation

## 2013-08-01 DIAGNOSIS — I679 Cerebrovascular disease, unspecified: Secondary | ICD-10-CM

## 2013-08-01 NOTE — Progress Notes (Signed)
Carotid duplex complete 

## 2013-10-02 ENCOUNTER — Ambulatory Visit (INDEPENDENT_AMBULATORY_CARE_PROVIDER_SITE_OTHER): Payer: BC Managed Care – PPO | Admitting: *Deleted

## 2013-10-02 DIAGNOSIS — I5023 Acute on chronic systolic (congestive) heart failure: Secondary | ICD-10-CM

## 2013-10-02 DIAGNOSIS — I5022 Chronic systolic (congestive) heart failure: Secondary | ICD-10-CM

## 2013-10-02 LAB — MDC_IDC_ENUM_SESS_TYPE_INCLINIC
Battery Remaining Longevity: 93.6 mo
HIGH POWER IMPEDANCE MEASURED VALUE: 52 Ohm
HighPow Impedance: 52.0222
Implantable Pulse Generator Serial Number: 7155171
Lead Channel Pacing Threshold Amplitude: 0.75 V
Lead Channel Pacing Threshold Pulse Width: 0.5 ms
Lead Channel Pacing Threshold Pulse Width: 0.5 ms
Lead Channel Setting Pacing Amplitude: 2.5 V
Lead Channel Setting Sensing Sensitivity: 0.5 mV
MDC IDC MSMT LEADCHNL RV IMPEDANCE VALUE: 412.5 Ohm
MDC IDC MSMT LEADCHNL RV PACING THRESHOLD AMPLITUDE: 0.75 V
MDC IDC MSMT LEADCHNL RV SENSING INTR AMPL: 11.7 mV
MDC IDC SESS DTM: 20151001094150
MDC IDC SET LEADCHNL RV PACING PULSEWIDTH: 0.5 ms
MDC IDC SET ZONE DETECTION INTERVAL: 300 ms
MDC IDC STAT BRADY RV PERCENT PACED: 0 %
Zone Setting Detection Interval: 250 ms

## 2013-10-02 NOTE — Progress Notes (Signed)
ICD check in clinic. Normal device function. Thresholds and sensing consistent with previous device measurements. Impedance trends stable over time. No evidence of any ventricular arrhythmias. Histogram distribution appropriate for patient and level of activity.  CorVue abnormal over the last 12 days.  [facility]   No changes made this session. Device programmed at appropriate safety margins. Device programmed to optimize intrinsic conduction. Estimated longevity 7.8 years.  Patient education completed including shock plan. Alert tones/vibration demonstrated for patient.  ROV in January with Dr. Graciela HusbandsKlein.

## 2013-10-15 ENCOUNTER — Encounter: Payer: Self-pay | Admitting: Internal Medicine

## 2013-11-03 ENCOUNTER — Encounter (HOSPITAL_COMMUNITY): Payer: Self-pay | Admitting: Internal Medicine

## 2013-11-03 ENCOUNTER — Inpatient Hospital Stay (HOSPITAL_COMMUNITY)
Admission: EM | Admit: 2013-11-03 | Discharge: 2013-11-12 | DRG: 253 | Disposition: A | Discharge status: Home / Self Care | Payer: BC Managed Care – PPO | Attending: Internal Medicine | Admitting: Internal Medicine

## 2013-11-03 ENCOUNTER — Emergency Department (HOSPITAL_COMMUNITY): Payer: BC Managed Care – PPO

## 2013-11-03 DIAGNOSIS — L03115 Cellulitis of right lower limb: Secondary | ICD-10-CM | POA: Diagnosis present

## 2013-11-03 DIAGNOSIS — E878 Other disorders of electrolyte and fluid balance, not elsewhere classified: Secondary | ICD-10-CM | POA: Diagnosis present

## 2013-11-03 DIAGNOSIS — I739 Peripheral vascular disease, unspecified: Secondary | ICD-10-CM | POA: Diagnosis present

## 2013-11-03 DIAGNOSIS — G629 Polyneuropathy, unspecified: Secondary | ICD-10-CM | POA: Diagnosis present

## 2013-11-03 DIAGNOSIS — E871 Hypo-osmolality and hyponatremia: Secondary | ICD-10-CM | POA: Diagnosis present

## 2013-11-03 DIAGNOSIS — I2510 Atherosclerotic heart disease of native coronary artery without angina pectoris: Secondary | ICD-10-CM | POA: Diagnosis present

## 2013-11-03 DIAGNOSIS — Z833 Family history of diabetes mellitus: Secondary | ICD-10-CM

## 2013-11-03 DIAGNOSIS — Z419 Encounter for procedure for purposes other than remedying health state, unspecified: Secondary | ICD-10-CM

## 2013-11-03 DIAGNOSIS — E861 Hypovolemia: Secondary | ICD-10-CM | POA: Diagnosis present

## 2013-11-03 DIAGNOSIS — Z95810 Presence of automatic (implantable) cardiac defibrillator: Secondary | ICD-10-CM | POA: Diagnosis not present

## 2013-11-03 DIAGNOSIS — L97329 Non-pressure chronic ulcer of left ankle with unspecified severity: Secondary | ICD-10-CM | POA: Diagnosis present

## 2013-11-03 DIAGNOSIS — I10 Essential (primary) hypertension: Secondary | ICD-10-CM | POA: Diagnosis present

## 2013-11-03 DIAGNOSIS — E1165 Type 2 diabetes mellitus with hyperglycemia: Secondary | ICD-10-CM | POA: Diagnosis present

## 2013-11-03 DIAGNOSIS — L97509 Non-pressure chronic ulcer of other part of unspecified foot with unspecified severity: Secondary | ICD-10-CM

## 2013-11-03 DIAGNOSIS — E11621 Type 2 diabetes mellitus with foot ulcer: Secondary | ICD-10-CM | POA: Diagnosis present

## 2013-11-03 DIAGNOSIS — I5022 Chronic systolic (congestive) heart failure: Secondary | ICD-10-CM | POA: Diagnosis present

## 2013-11-03 DIAGNOSIS — Z7982 Long term (current) use of aspirin: Secondary | ICD-10-CM

## 2013-11-03 DIAGNOSIS — Z955 Presence of coronary angioplasty implant and graft: Secondary | ICD-10-CM | POA: Diagnosis not present

## 2013-11-03 DIAGNOSIS — T82198D Other mechanical complication of other cardiac electronic device, subsequent encounter: Secondary | ICD-10-CM

## 2013-11-03 DIAGNOSIS — Z794 Long term (current) use of insulin: Secondary | ICD-10-CM | POA: Diagnosis not present

## 2013-11-03 DIAGNOSIS — I255 Ischemic cardiomyopathy: Secondary | ICD-10-CM

## 2013-11-03 DIAGNOSIS — L97519 Non-pressure chronic ulcer of other part of right foot with unspecified severity: Secondary | ICD-10-CM | POA: Diagnosis present

## 2013-11-03 DIAGNOSIS — I252 Old myocardial infarction: Secondary | ICD-10-CM

## 2013-11-03 DIAGNOSIS — E1151 Type 2 diabetes mellitus with diabetic peripheral angiopathy without gangrene: Secondary | ICD-10-CM | POA: Diagnosis present

## 2013-11-03 DIAGNOSIS — E785 Hyperlipidemia, unspecified: Secondary | ICD-10-CM | POA: Diagnosis present

## 2013-11-03 DIAGNOSIS — Z01810 Encounter for preprocedural cardiovascular examination: Secondary | ICD-10-CM | POA: Insufficient documentation

## 2013-11-03 DIAGNOSIS — I872 Venous insufficiency (chronic) (peripheral): Secondary | ICD-10-CM | POA: Diagnosis present

## 2013-11-03 DIAGNOSIS — Z4502 Encounter for adjustment and management of automatic implantable cardiac defibrillator: Secondary | ICD-10-CM

## 2013-11-03 DIAGNOSIS — F17210 Nicotine dependence, cigarettes, uncomplicated: Secondary | ICD-10-CM | POA: Diagnosis present

## 2013-11-03 DIAGNOSIS — I771 Stricture of artery: Secondary | ICD-10-CM | POA: Diagnosis present

## 2013-11-03 DIAGNOSIS — E876 Hypokalemia: Secondary | ICD-10-CM | POA: Diagnosis present

## 2013-11-03 LAB — CBC WITH DIFFERENTIAL/PLATELET
Basophils Absolute: 0 10*3/uL (ref 0.0–0.1)
Basophils Relative: 0 % (ref 0–1)
EOS PCT: 2 % (ref 0–5)
Eosinophils Absolute: 0.2 10*3/uL (ref 0.0–0.7)
HCT: 50.4 % (ref 39.0–52.0)
Hemoglobin: 17 g/dL (ref 13.0–17.0)
LYMPHS ABS: 1.1 10*3/uL (ref 0.7–4.0)
LYMPHS PCT: 10 % — AB (ref 12–46)
MCH: 30 pg (ref 26.0–34.0)
MCHC: 33.7 g/dL (ref 30.0–36.0)
MCV: 89 fL (ref 78.0–100.0)
Monocytes Absolute: 0.7 10*3/uL (ref 0.1–1.0)
Monocytes Relative: 6 % (ref 3–12)
NEUTROS ABS: 8.7 10*3/uL — AB (ref 1.7–7.7)
NEUTROS PCT: 82 % — AB (ref 43–77)
Platelets: 202 10*3/uL (ref 150–400)
RBC: 5.66 MIL/uL (ref 4.22–5.81)
RDW: 14.4 % (ref 11.5–15.5)
WBC: 10.7 10*3/uL — ABNORMAL HIGH (ref 4.0–10.5)

## 2013-11-03 LAB — HEPATIC FUNCTION PANEL
ALT: 26 U/L (ref 0–53)
AST: 19 U/L (ref 0–37)
Albumin: 3.2 g/dL — ABNORMAL LOW (ref 3.5–5.2)
Alkaline Phosphatase: 164 U/L — ABNORMAL HIGH (ref 39–117)
Bilirubin, Direct: <0.2 mg/dL (ref 0.0–0.3)
TOTAL PROTEIN: 8.3 g/dL (ref 6.0–8.3)
Total Bilirubin: 0.4 mg/dL (ref 0.3–1.2)

## 2013-11-03 LAB — BASIC METABOLIC PANEL
ANION GAP: 14 (ref 5–15)
BUN: 13 mg/dL (ref 6–23)
CO2: 29 meq/L (ref 19–32)
Calcium: 9.4 mg/dL (ref 8.4–10.5)
Chloride: 93 mEq/L — ABNORMAL LOW (ref 96–112)
Creatinine, Ser: 0.81 mg/dL (ref 0.50–1.35)
GFR calc Af Amer: >90 mL/min (ref >90)
GFR calc non Af Amer: >90 mL/min (ref >90)
GLUCOSE: 408 mg/dL — AB (ref 70–99)
POTASSIUM: 5.1 meq/L (ref 3.7–5.3)
Sodium: 136 mEq/L — ABNORMAL LOW (ref 137–147)

## 2013-11-03 LAB — GLUCOSE, CAPILLARY: GLUCOSE-CAPILLARY: 151 mg/dL — AB (ref 70–99)

## 2013-11-03 LAB — CBG MONITORING, ED: Glucose-Capillary: 353 mg/dL — ABNORMAL HIGH (ref 70–99)

## 2013-11-03 LAB — SEDIMENTATION RATE: SED RATE: 14 mm/h (ref 0–16)

## 2013-11-03 LAB — I-STAT CG4 LACTIC ACID, ED: LACTIC ACID, VENOUS: 1.68 mmol/L (ref 0.5–2.2)

## 2013-11-03 MED ORDER — INSULIN ASPART 100 UNIT/ML ~~LOC~~ SOLN
3.0000 [IU] | Freq: Three times a day (TID) | SUBCUTANEOUS | Status: DC
  Administered 2013-11-04 – 2013-11-06 (×7): 3 [IU] via SUBCUTANEOUS

## 2013-11-03 MED ORDER — ACETAMINOPHEN 325 MG PO TABS: 650.0000 mg | ORAL_TABLET | Freq: Four times a day (QID) | ORAL | Status: DC | PRN

## 2013-11-03 MED ORDER — CIPROFLOXACIN IN D5W 400 MG/200ML IV SOLN
400.0000 mg | Freq: Two times a day (BID) | INTRAVENOUS | Status: DC
  Administered 2013-11-03 – 2013-11-08 (×9): 400 mg via INTRAVENOUS
  Filled 2013-11-03 (×12): qty 200

## 2013-11-03 MED ORDER — POLYETHYLENE GLYCOL 3350 17 G PO PACK
17.0000 g | PACK | Freq: Every day | ORAL | Status: DC | PRN
  Filled 2013-11-03: qty 1

## 2013-11-03 MED ORDER — SODIUM CHLORIDE 0.9 % IJ SOLN
3.0000 mL | Freq: Two times a day (BID) | INTRAMUSCULAR | Status: DC
  Administered 2013-11-05 – 2013-11-12 (×13): 3 mL via INTRAVENOUS

## 2013-11-03 MED ORDER — BENAZEPRIL HCL 5 MG PO TABS
5.0000 mg | ORAL_TABLET | Freq: Every day | ORAL | Status: DC
  Administered 2013-11-04 – 2013-11-12 (×7): 5 mg via ORAL
  Filled 2013-11-03 (×9): qty 1

## 2013-11-03 MED ORDER — INSULIN DETEMIR 100 UNIT/ML ~~LOC~~ SOLN
20.0000 [IU] | Freq: Every day | SUBCUTANEOUS | Status: DC
  Administered 2013-11-04 – 2013-11-06 (×4): 20 [IU] via SUBCUTANEOUS
  Filled 2013-11-03 (×6): qty 0.2

## 2013-11-03 MED ORDER — SODIUM CHLORIDE 0.9 % IV SOLN: 250.0000 mL | INTRAVENOUS | Status: DC | PRN

## 2013-11-03 MED ORDER — ONDANSETRON HCL 4 MG/2ML IJ SOLN: 4.0000 mg | Freq: Four times a day (QID) | INTRAMUSCULAR | Status: DC | PRN

## 2013-11-03 MED ORDER — DIPHENHYDRAMINE HCL 50 MG/ML IJ SOLN
25.0000 mg | Freq: Once | INTRAMUSCULAR | Status: AC
  Administered 2013-11-03: 25 mg via INTRAVENOUS
  Filled 2013-11-03: qty 1

## 2013-11-03 MED ORDER — INSULIN ASPART 100 UNIT/ML ~~LOC~~ SOLN
0.0000 [IU] | Freq: Every day | SUBCUTANEOUS | Status: DC
  Administered 2013-11-04: 2 [IU] via SUBCUTANEOUS

## 2013-11-03 MED ORDER — ASPIRIN 81 MG PO TABS: 81.0000 mg | ORAL_TABLET | Freq: Every day | ORAL | Status: DC

## 2013-11-03 MED ORDER — GLIMEPIRIDE 4 MG PO TABS
4.0000 mg | ORAL_TABLET | Freq: Every day | ORAL | Status: DC
  Administered 2013-11-04: 4 mg via ORAL
  Filled 2013-11-03 (×2): qty 1

## 2013-11-03 MED ORDER — ATORVASTATIN CALCIUM 80 MG PO TABS
80.0000 mg | ORAL_TABLET | ORAL | Status: DC
  Administered 2013-11-04 – 2013-11-12 (×5): 80 mg via ORAL
  Filled 2013-11-03 (×6): qty 1

## 2013-11-03 MED ORDER — SODIUM CHLORIDE 0.9 % IJ SOLN
3.0000 mL | INTRAMUSCULAR | Status: DC | PRN
  Administered 2013-11-05: 3 mL via INTRAVENOUS
  Filled 2013-11-03: qty 3

## 2013-11-03 MED ORDER — ASPIRIN EC 81 MG PO TBEC
81.0000 mg | DELAYED_RELEASE_TABLET | Freq: Every day | ORAL | Status: DC
  Administered 2013-11-03 – 2013-11-12 (×9): 81 mg via ORAL
  Filled 2013-11-03 (×9): qty 1

## 2013-11-03 MED ORDER — CARVEDILOL 6.25 MG PO TABS
18.7500 mg | ORAL_TABLET | Freq: Two times a day (BID) | ORAL | Status: DC
  Administered 2013-11-04: 18.75 mg via ORAL
  Filled 2013-11-03 (×3): qty 1

## 2013-11-03 MED ORDER — HYDROCODONE-ACETAMINOPHEN 5-325 MG PO TABS
1.0000 | ORAL_TABLET | ORAL | Status: DC | PRN
  Administered 2013-11-08: 1 via ORAL
  Filled 2013-11-03: qty 2

## 2013-11-03 MED ORDER — METOCLOPRAMIDE HCL 5 MG/ML IJ SOLN
10.0000 mg | Freq: Once | INTRAMUSCULAR | Status: AC
  Administered 2013-11-03: 10 mg via INTRAVENOUS
  Filled 2013-11-03: qty 2

## 2013-11-03 MED ORDER — ACETAMINOPHEN 650 MG RE SUPP: 650.0000 mg | Freq: Four times a day (QID) | RECTAL | Status: DC | PRN

## 2013-11-03 MED ORDER — VANCOMYCIN HCL 10 G IV SOLR
1500.0000 mg | Freq: Two times a day (BID) | INTRAVENOUS | Status: DC
  Administered 2013-11-03 – 2013-11-08 (×9): 1500 mg via INTRAVENOUS
  Filled 2013-11-03 (×12): qty 1500

## 2013-11-03 MED ORDER — INSULIN ASPART 100 UNIT/ML ~~LOC~~ SOLN
10.0000 [IU] | Freq: Once | SUBCUTANEOUS | Status: AC
  Administered 2013-11-03: 10 [IU] via SUBCUTANEOUS
  Filled 2013-11-03: qty 1

## 2013-11-03 MED ORDER — HEPARIN SODIUM (PORCINE) 5000 UNIT/ML IJ SOLN
5000.0000 [IU] | Freq: Three times a day (TID) | INTRAMUSCULAR | Status: DC
  Administered 2013-11-03 – 2013-11-07 (×10): 5000 [IU] via SUBCUTANEOUS
  Filled 2013-11-03 (×11): qty 1

## 2013-11-03 MED ORDER — INSULIN ASPART 100 UNIT/ML ~~LOC~~ SOLN
0.0000 [IU] | Freq: Three times a day (TID) | SUBCUTANEOUS | Status: DC
  Administered 2013-11-04 – 2013-11-05 (×3): 2 [IU] via SUBCUTANEOUS
  Administered 2013-11-06: 3 [IU] via SUBCUTANEOUS
  Administered 2013-11-08: 5 [IU] via SUBCUTANEOUS
  Administered 2013-11-08: 2 [IU] via SUBCUTANEOUS
  Administered 2013-11-08: 5 [IU] via SUBCUTANEOUS

## 2013-11-03 MED ORDER — TORSEMIDE 20 MG PO TABS: 60.0000 mg | ORAL_TABLET | Freq: Every day | ORAL | Status: DC

## 2013-11-03 MED ORDER — ONDANSETRON HCL 4 MG PO TABS: 4.0000 mg | ORAL_TABLET | Freq: Four times a day (QID) | ORAL | Status: DC | PRN

## 2013-11-03 NOTE — Progress Notes (Signed)
[facility]

## 2013-11-03 NOTE — ED Notes (Addendum)
Pt with nonhealing foot ulcer to right foot x 10 days. Pt reports two areas to right foot, drainage and swelling but area is wrapped at this time. Pt with hx of the same. Able to see nonhealing ulcer to left lateral ankle, states this one is not bothering him. Pt states that he was taking amoxicillin at home that he had left over (states 4000mg  already). Denies fevers. Right leg does feel warmer then left.

## 2013-11-03 NOTE — ED Provider Notes (Signed)
CSN: [id number]     Arrival date & time 11/03/13  1212 History   First MD Initiated Contact with Patient 11/03/13 1604     Chief Complaint  Patient presents with  . Foot Ulcer     (Consider location/radiation/quality/duration/timing/severity/associated sxs/prior Treatment) HPI Comments: [name] is a 58 y.o. male with a PMHx of HTN, HLD, PAD, PVD, CHF, ischemic cardiomyopathy, DM2 with peripheral neuropathy, CAD, carotid artery disease, and chronic nausea/vomiting x6 years with neg EGD, who presents to the ED with nonhealing R foot ulcer x10 days. Pt reports that it began as a blister on his R foot near the great toe, which ruptured spontaneously, and he has been using neosporin and hydrogen peroxide washes which initially helped but then over the last few days the pain and erythema has worsened and begun spreading up his ankle and calf. States the pain is 3/10 sharp intermittent pain worse with movement of the extremity and walking, and improved with rest. Tried heat without relief. He also took 4 days of amoxicillin 1077m tabs daily that he had left over from a different medical condition, which helped some but not significantly. Endorses purulent drainage from the wound. Additionally he had a L ankle blister which occurred at the same time but has improved and is resolving at this time. Has baseline neuropathy numbness which has not changed, and denies any weakness in the extremity. Also has chronic nausea/vomiting for which he's been worked up and does not know what the etiology is, but this has not changed/worsened. Denies red streaking, fevers, chills, CP, SOB, abd pain, diarrhea, constipation, dysuria, hematuria, new numbness or paresthesias, or focal neuro deficits. Has not taken any of his medications this morning. Denies any LE swelling.   Patient is a 58y.o. male presenting with lower extremity pain. The history is provided by the patient. No language interpreter was used.  Foot  Pain This is a new problem. The current episode started in the past 7 days. The problem occurs constantly. The problem has been gradually worsening. Associated symptoms include arthralgias (R foot), joint swelling (R foot swollen), myalgias (R foot), nausea (chronic, ongoing, unchanged), numbness (baseline peripheral neuropathy, no change) and vomiting (chronic, ongoing, unchanged). Pertinent negatives include no abdominal pain, chest pain, chills, diaphoresis, fever, headaches, neck pain, sore throat, urinary symptoms or weakness. The symptoms are aggravated by walking and standing. He has tried rest and heat for the symptoms. The treatment provided mild relief.    Past Medical History  Diagnosis Date  . Hyperlipidemia     mixed  . Chronic systolic heart failure   . ICD (implantable cardiac defibrillator), dual, in situ     St. Jude for severe LVD EF 25% 2/07 explanted 2010. Medtronic Virtuoso II DR Dual-chamber cardiverter-defibrillator  with pocket revision, Dr. KCaryl Comes . Tobacco abuse   . Ischemic cardiomyopathy     a. Echo (09/27/12): EF 20%, diffuse HK with periapical AK, no LV thrombus noted, restrictive physiology, trivial MR, mild LAE, RVSF mildly reduced, PASP 39  . Cerebrovascular disease   . Diabetes mellitus   . HTN (hypertension)   . PVD (peripheral vascular disease)     a. ABI 09/2011 - R normal, L moderate - saw Dr. AFletcher Anon- med rx.   . Carotid artery disease     a. Dopp 09/2011: 662-26%RICA, 433-35%LICA.;  b.  Carotid [place](7/14):  Bilateral 60-79% => f/u 6 mos  . Coronary artery disease     a. AWMI  requiring IABP 2005 s/p Horizon study stent-LAD, staged BMS-Cx, DESx2-RCA. b. Last Last LHC (6/06):  EF 25%, pLAD stent 40-50, after stent 40, dLAD 20, pCFX 20, pOM1 70, pRCA 20, mRCA stents ok.=> med Rx;  c.  Myoview (7/14):  EF 26%, large ant, septal, inf and apical infarct, no ischemia.  Med Rx continued  . RIATA ICD Lead--on advisory recall     Past Surgical History  Procedure  Laterality Date  . Medtronic virtuoso ii dr dual-chamber cardioverter-defibrillation with pocket revison      Dr. Virl Axe   Family History  Problem Relation Age of Onset  . Diabetes Mother   . Coronary artery disease Father    History  Substance Use Topics  . Smoking status: Current Some Day Smoker -- 1.00 packs/day for 33 years    Types: Cigarettes  . Smokeless tobacco: Never Used     Comment: smoker for 33 years  . Alcohol Use: No    Review of Systems  Constitutional: Negative for fever, chills and diaphoresis.  HENT: Negative for sore throat.   Respiratory: Negative for shortness of breath.   Cardiovascular: Negative for chest pain.  Gastrointestinal: Positive for nausea (chronic, ongoing, unchanged) and vomiting (chronic, ongoing, unchanged). Negative for abdominal pain, diarrhea and constipation.  Genitourinary: Negative for dysuria and hematuria.  Musculoskeletal: Positive for myalgias (R foot), joint swelling (R foot swollen) and arthralgias (R foot). Negative for neck pain.  Skin: Positive for color change.  Allergic/Immunologic: Positive for immunocompromised state.  Neurological: Positive for numbness (baseline peripheral neuropathy, no change). Negative for weakness and headaches.  Psychiatric/Behavioral: Negative for confusion.   10 Systems reviewed and are negative for acute change except as noted in the HPI.    Allergies  Niacin  Home Medications   Prior to Admission medications   Medication Sig Start Date End Date Taking? Authorizing Provider  aspirin 81 MG tablet Take 1 tablet (81 mg total) by mouth daily. 10/07/12  Yes Deboraha Sprang, MD  atorvastatin (LIPITOR) 80 MG tablet Take 80 mg by mouth every other day.    Yes Historical Provider, MD  benazepril (LOTENSIN) 5 MG tablet Take 5 mg by mouth daily.   Yes Historical Provider, MD  carvedilol (COREG) 12.5 MG tablet Take 18.75 mg by mouth 2 (two) times daily with a meal.   Yes Historical Provider, MD    ezetimibe (ZETIA) 10 MG tablet Take 10 mg by mouth daily.   Yes Historical Provider, MD  glimepiride (AMARYL) 4 MG tablet Take 4 mg by mouth daily before breakfast.   Yes Historical Provider, MD  JARDIANCE 25 MG TABS tablet Take 25 mg by mouth daily. 10/13/13  Yes Historical Provider, MD  NOVOLIN N RELION 100 UNIT/ML injection Inject 18 Units into the skin daily.  03/31/13  Yes Historical Provider, MD  spironolactone (ALDACTONE) 25 MG tablet Take 1 tablet (25 mg total) by mouth daily. 01/31/13  Yes Jolaine Artist, MD  torsemide (DEMADEX) 20 MG tablet Take 60 mg by mouth daily.  01/31/13  Yes Rande Brunt, NP  loratadine (CLARITIN) 10 MG tablet Take 10 mg by mouth daily as needed.     Historical Provider, MD  metolazone (ZAROXOLYN) 2.5 MG tablet Take 2.5 mg by mouth See admin instructions. Take 2.5 mg daily for 2 consecutive days (two tablets per week) if needed for excess fluid when LASIX does not work. 10/29/12   Liliane Shi, PA-C  polyethylene glycol (MIRALAX / GLYCOLAX) packet Take 17 g  by mouth daily as needed.    Historical Provider, MD  potassium chloride (K-DUR,KLOR-CON) 10 MEQ tablet as needed. 01/17/13   Shaune Pascal Bensimhon, MD   BP 132/75 mmHg  Pulse 105  Temp(Src) 98.7 F (37.1 C) (Oral)  Resp 17  Ht 6' (1.829 m)  Wt 186 lb (84.369 kg)  BMI 25.22 kg/m2  SpO2 98% Physical Exam  Constitutional: He is oriented to person, place, and time. He appears well-developed. He has a sickly appearance.  Afebrile, mildly tachycardic, appears sickly and thin  HENT:  Head: Normocephalic and atraumatic.  Mouth/Throat: Oropharynx is clear and moist and mucous membranes are normal.  Eyes: Conjunctivae and EOM are normal. Right eye exhibits no discharge. Left eye exhibits no discharge.  Neck: Normal range of motion. Neck supple.  Cardiovascular: Normal rate, regular rhythm and normal heart sounds.  Exam reveals no gallop and no friction rub.   No murmur heard. Distal pulses nonpalpable  which is baseline per pt, cap refill intact.   Pulmonary/Chest: Effort normal and breath sounds normal. No respiratory distress. He has no decreased breath sounds. He has no wheezes. He has no rhonchi. He has no rales.  Abdominal: Soft. Normal appearance and bowel sounds are normal. He exhibits no distension. There is no tenderness. There is no rigidity, no rebound and no guarding.  Musculoskeletal:       Right ankle: He exhibits decreased range of motion (due to pain), swelling and abnormal pulse. Tenderness. Achilles tendon normal.  R foot erythematous up to ankle with blister over great toe (SEE PICTURE BELOW), warmth extending into ankle. Brawny appearance to b/l lower legs. Distal pulses nonpalpable which is baseline per pt. Cap refill intact. No pitting edema. Strength diminished due to pain. Sensation diminished but baseline per pt.  L ankle wound without surrounding erythema or warmth (SEE PICTURE BELOW)  Neurological: He is alert and oriented to person, place, and time. A sensory deficit is present.  Sensation as above, diminished at baseline  Skin: Skin is warm and dry. No rash noted. There is erythema.  See MsK exam above for skin details  Psychiatric: He has a normal mood and affect.  Nursing note and vitals reviewed.       ED Course  Procedures (including critical care time) Labs Review Labs Reviewed  CBC WITH DIFFERENTIAL - Abnormal; Notable for the following:    WBC 10.7 (*)    Neutrophils Relative % 82 (*)    Neutro Abs 8.7 (*)    Lymphocytes Relative 10 (*)    All other components within normal limits  BASIC METABOLIC PANEL - Abnormal; Notable for the following:    Sodium 136 (*)    Chloride 93 (*)    Glucose, Bld 408 (*)    All other components within normal limits  CBG MONITORING, ED - Abnormal; Notable for the following:    Glucose-Capillary 353 (*)    All other components within normal limits  CULTURE, BLOOD (ROUTINE X 2)  CULTURE, BLOOD (ROUTINE X 2)   C-REACTIVE PROTEIN  SEDIMENTATION RATE  HEPATIC FUNCTION PANEL  HEMOGLOBIN A1C  HIV ANTIBODY (ROUTINE TESTING)  I-STAT CG4 LACTIC ACID, ED    Imaging Review Dg Foot Complete Right  11/03/2013   CLINICAL DATA:  58 year old male with foot ulcer at the dorsum of the medial right foot. Progressing. Swelling. Initial encounter.  EXAM: RIGHT FOOT COMPLETE - 3+ VIEW  COMPARISON:  None.  FINDINGS: Calcified atherosclerosis tracking into the right foot and continuing distally. Calcaneus  intact. Bone mineralization is within normal limits. Degenerative changes at the first and third MTP joints. No acute fracture or dislocation identified. No osteolysis identified. No subcutaneous gas. No radiopaque foreign body identified.  IMPRESSION: 1. No acute osseous abnormality or plain radiographic evidence of osteomyelitis in the right foot. 2. Calcified peripheral vascular disease.   Electronically Signed   By: Lars Pinks M.D.   On: 11/03/2013 17:13     EKG Interpretation None      MDM   Final diagnoses:  Foot ulcer  Type 2 diabetes mellitus with hyperglycemia  Cellulitis of right lower extremity  PVD (peripheral vascular disease)    58y/o male with nonhealing foot ulcer. Hx of PAD and nonhealing wounds, as well as diabetes. Wound does appear to be developing into cellulitis, likely due to DM and PAD. CBC w/diff showing WBC 46.5, neutrophilic. BMP showing baseline hyponatremia and hypochloremia. Glucose 408 without anion gap, pt has not taken any meds today. Will give insulin now. Want to avoid fluids due to concern with CHF exacerbation, since he hasn't taken his torsemide today. Will obtain lactic acid, xray, and hepatic function study for albumin level. Pt declines pain meds at this time. Question whether pt would be able to comply with or complete outpatient management, and given his history, will consult with [facility] for admission for IV antibiotics. Pt does not meet SIRS criteria, and given CHF, will  avoid pushing fluids at this time. Will give reglan and benadryl for nausea, which he states is chronic x6 yrs and has had neg EGD, could be diabetic gastroparesis. Will reassess shortly.  5:31 PM Dr. Aileen Fass of [facility] returning page. Will admit to med-surg. Will start vanc + cipro per his request. Lactic acid 1.68 and xray neg for osteo. CRP and ESR pending. Care transferred to hospitalist. Discussed case with Dr. Stevie Kern who saw pt and agreed with the above plan.   [place], [place] 11/03/13 1905

## 2013-11-03 NOTE — ED Notes (Signed)
Asked patient if he would like something for his pain, states he does not want anything at this time.

## 2013-11-03 NOTE — Progress Notes (Signed)
ANTIBIOTIC CONSULT NOTE - INITIAL  Pharmacy Consult for Ciprofloxacin, Vancomycin  Indication: Cellulitis   Allergies  Allergen Reactions  . Niacin Itching and Other (See Comments)    Flushing     Patient Measurements: Height: 6' (182.9 cm) Weight: 186 lb (84.369 kg) IBW/kg (Calculated) : 77.6 Adjusted Body Weight: n/a   Vital Signs: Temp: 98.7 F (37.1 C) (11/02 1545) Temp Source: Oral (11/02 1545) BP: 132/75 mmHg (11/02 1545) Pulse Rate: 105 (11/02 1545) Intake/Output from previous day:   Intake/Output from this shift:    Labs:  Recent Labs  11/03/13 1312  WBC 10.7*  HGB 17.0  PLT 202  CREATININE 0.81   Estimated Creatinine Clearance: 107.8 mL/min (by C-G formula based on Cr of 0.81). No results for input(s): VANCOTROUGH, VANCOPEAK, VANCORANDOM, GENTTROUGH, GENTPEAK, GENTRANDOM, TOBRATROUGH, TOBRAPEAK, TOBRARND, AMIKACINPEAK, AMIKACINTROU, AMIKACIN in the last 72 hours.   Microbiology: No results found for this or any previous visit (from the past 720 hour(s)).  Medical History: Past Medical History  Diagnosis Date  . Hyperlipidemia     mixed  . Chronic systolic heart failure   . ICD (implantable cardiac defibrillator), dual, in situ     St. Jude for severe LVD EF 25% 2/07 explanted 2010. Medtronic Virtuoso II DR Dual-chamber cardiverter-defibrillator  with pocket revision, Dr. Graciela HusbandsKlein  . Tobacco abuse   . Ischemic cardiomyopathy     a. Echo (09/27/12): EF 20%, diffuse HK with periapical AK, no LV thrombus noted, restrictive physiology, trivial MR, mild LAE, RVSF mildly reduced, PASP 39  . Cerebrovascular disease   . Diabetes mellitus   . HTN (hypertension)   . PVD (peripheral vascular disease)     a. ABI 09/2011 - R normal, L moderate - saw Dr. Kirke CorinArida - med rx.   . Carotid artery disease     a. Dopp 09/2011: 60-79% RICA, 40-59% LICA.;  b.  Carotid US (7/14):  Bilateral 60-79% => f/u 6 mos  . Coronary artery disease     a. AWMI requiring IABP 2005 s/p  Horizon study stent-LAD, staged BMS-Cx, DESx2-RCA. b. Last Last LHC (6/06):  EF 25%, pLAD stent 40-50, after stent 40, dLAD 20, pCFX 20, pOM1 70, pRCA 20, mRCA stents ok.=> med Rx;  c.  Myoview (7/14):  EF 26%, large ant, septal, inf and apical infarct, no ischemia.  Med Rx continued  . RIATA ICD Lead--on advisory recall      Medications:   (Not in a hospital admission) Assessment: [age] YOM who presented to the ED with nonhealing R foot ulcer x 10 days. Pharmacy consulted to start Vancomycin and ciprofloxacin for empiric coverage. Pt is afebrile. WBC mildly elevated at 10.7. CrCl > 100 mL/min.   11/2: Blood Cx x2>>   Goal of Therapy:  Vancomycin trough level 10-15 mcg/ml  Plan:  -Vancomycin 1500 mg IV Q 12 hours -Ciprofloxacin 400 mg IV Q 12 hours -Monitor CBC, renal fx, cultures and patient's clinical progress -[facility]   Auburn Hert, PharmD.  Clinical Pharmacist Pager [phone]

## 2013-11-03 NOTE — H&P (Addendum)
[facility] History and Physical  [name]:[id number] DOB: 03/14/1955 DOA: 11/03/2013  Referring physician: Dr. Fonnie Jarvis PCP: [name], MD   Chief Complaint: right foot ulcer  HPI: [name] is a 58 y.o. male past medical history of hypertension hyperlipidemia PAD PVD ischemic cardiomyopathy, diabetes type 2 with a last hemoglobin A1c of 10, peripheral neuropathy that comes in for a blister that started 7 days prior to admission. He relates this has progressively gotten worse to the point where it burst. After this he started putting Neosporin and hydrogen peroxide. He also took 4 days of amoxicillin 1000mg  tabs daily that he had left over from a different medical condition, which helped some but not significantly. Endorses purulent drainage from the wound  In the ED: X-ray was done vancomycin and Cipro were started and we're consulted for further evaluation.  Review of Systems:  Constitutional:  No weight loss, night sweats, Fevers, chills, fatigue.  HEENT:  No headaches, Difficulty swallowing,Tooth/dental problems,Sore throat,  No sneezing, itching, ear ache, nasal congestion, post nasal drip,  Cardio-vascular:  No chest pain, Orthopnea, PND, swelling in lower extremities, anasarca, dizziness, palpitations  GI:  No heartburn, indigestion, abdominal pain, nausea, vomiting, diarrhea, change in bowel habits, loss of appetite  Resp:  No shortness of breath with exertion or at rest. No excess mucus, no productive cough, No non-productive cough, No coughing up of blood.No change in color of mucus.No wheezing.No chest wall deformity  Skin:  no rash or lesions.  GU:  no dysuria, change in color of urine, no urgency or frequency. No flank pain.  Musculoskeletal:  No joint pain or swelling. No decreased range of motion. No back pain.  Psych:  No change in mood or affect. No depression or anxiety. No memory loss.   Past Medical History  Diagnosis Date  .  Hyperlipidemia     mixed  . Chronic systolic heart failure   . ICD (implantable cardiac defibrillator), dual, in situ     St. Jude for severe LVD EF 25% 2/07 explanted 2010. Medtronic Virtuoso II DR Dual-chamber cardiverter-defibrillator  with pocket revision, Dr. Graciela Husbands  . Tobacco abuse   . Ischemic cardiomyopathy     a. Echo (09/27/12): EF 20%, diffuse HK with periapical AK, no LV thrombus noted, restrictive physiology, trivial MR, mild LAE, RVSF mildly reduced, PASP 39  . Cerebrovascular disease   . Diabetes mellitus   . HTN (hypertension)   . PVD (peripheral vascular disease)     a. ABI 09/2011 - R normal, L moderate - saw Dr. Kirke Corin - med rx.   . Carotid artery disease     a. Dopp 09/2011: 60-79% RICA, 40-59% LICA.;  b.  Carotid US (7/14):  Bilateral 60-79% => f/u 6 mos  . Coronary artery disease     a. AWMI requiring IABP 2005 s/p Horizon study stent-LAD, staged BMS-Cx, DESx2-RCA. b. Last Last LHC (6/06):  EF 25%, pLAD stent 40-50, after stent 40, dLAD 20, pCFX 20, pOM1 70, pRCA 20, mRCA stents ok.=> med Rx;  c.  Myoview (7/14):  EF 26%, large ant, septal, inf and apical infarct, no ischemia.  Med Rx continued  . RIATA ICD Lead--on advisory recall     Past Surgical History  Procedure Laterality Date  . Medtronic virtuoso ii dr dual-chamber cardioverter-defibrillation with pocket revison      Dr. Sherryl Manges   Social History:  reports that he has been smoking Cigarettes.  He has a 33 pack-year smoking history. He  has never used smokeless tobacco. He reports that he does not drink alcohol or use illicit drugs.  Allergies  Allergen Reactions  . Niacin Itching and Other (See Comments)    Flushing     Family History  Problem Relation Age of Onset  . Diabetes Mother   . Coronary artery disease Father      Prior to Admission medications   Medication Sig Start Date End Date Taking? Authorizing Provider  aspirin 81 MG tablet Take 1 tablet (81 mg total) by mouth daily. 10/07/12  Yes  Duke SalviaSteven C Klein, MD  atorvastatin (LIPITOR) 80 MG tablet Take 80 mg by mouth every other day.    Yes Historical Provider, MD  benazepril (LOTENSIN) 5 MG tablet Take 5 mg by mouth daily.   Yes Historical Provider, MD  carvedilol (COREG) 12.5 MG tablet Take 18.75 mg by mouth 2 (two) times daily with a meal.   Yes Historical Provider, MD  ezetimibe (ZETIA) 10 MG tablet Take 10 mg by mouth daily.   Yes Historical Provider, MD  glimepiride (AMARYL) 4 MG tablet Take 4 mg by mouth daily before breakfast.   Yes Historical Provider, MD  JARDIANCE 25 MG TABS tablet Take 25 mg by mouth daily. 10/13/13  Yes Historical Provider, MD  NOVOLIN N RELION 100 UNIT/ML injection Inject 18 Units into the skin daily.  03/31/13  Yes Historical Provider, MD  spironolactone (ALDACTONE) 25 MG tablet Take 1 tablet (25 mg total) by mouth daily. 01/31/13  Yes Dolores Pattyaniel R Bensimhon, MD  torsemide (DEMADEX) 20 MG tablet Take 60 mg by mouth daily.  01/31/13  Yes Aundria RudAli B Cosgrove, NP  loratadine (CLARITIN) 10 MG tablet Take 10 mg by mouth daily as needed.     Historical Provider, MD  metolazone (ZAROXOLYN) 2.5 MG tablet Take 2.5 mg by mouth See admin instructions. Take 2.5 mg daily for 2 consecutive days (two tablets per week) if needed for excess fluid when LASIX does not work. 10/29/12   Beatrice LecherScott T Weaver, PA-C  polyethylene glycol (MIRALAX / GLYCOLAX) packet Take 17 g by mouth daily as needed.    Historical Provider, MD  potassium chloride (K-DUR,KLOR-CON) 10 MEQ tablet as needed. 01/17/13   Dolores Pattyaniel R Bensimhon, MD   Physical Exam: Filed Vitals:   11/03/13 1250 11/03/13 1545  BP: 123/75 132/75  Pulse: 97 105  Temp: 98 F (36.7 C) 98.7 F (37.1 C)  TempSrc: Oral Oral  Resp: 17 17  Height: 6' (1.829 m)   Weight: 84.369 kg (186 lb)   SpO2: 93% 98%    Wt Readings from Last 3 Encounters:  11/03/13 84.369 kg (186 lb)  05/28/13 87.658 kg (193 lb 4 oz)  04/29/13 90.774 kg (200 lb 1.9 oz)    General:  Appears calm and comfortable,  patient is not cooperating Eyes: PERRL, normal lids, irises & conjunctiva ENT: grossly normal hearing, lips & tongue, poor oral hygiene Neck: no LAD, masses or thyromegalyno JVD Cardiovascular: RRR, no m/r/g. No LE edema. Telemetry: SR, no arrhythmias  Respiratory: CTA bilaterally, no w/r/r. Normal respiratory effort. Abdomen: soft, ntnd Skin: has an ulcer about 4 x 2 cm and is right foot ulcerated with some mild purulence and erythema around the ulcer, dried heel 2 x 2 ulcer on the left external malleolus. Venous insufficiency changes bilaterally. Musculoskeletal: grossly normal tone BUE/BLE Psychiatric: grossly normal mood and affect, speech fluent and appropriate Neurologic: grossly non-focal.          Labs on Admission:  Basic Metabolic  Panel:  Recent Labs Lab 11/03/13 1312  NA 136*  K 5.1  CL 93*  CO2 29  GLUCOSE 408*  BUN 13  CREATININE 0.81  CALCIUM 9.4   Liver Function Tests: No results for input(s): AST, ALT, ALKPHOS, BILITOT, PROT, ALBUMIN in the last 168 hours. No results for input(s): LIPASE, AMYLASE in the last 168 hours. No results for input(s): AMMONIA in the last 168 hours. CBC:  Recent Labs Lab 11/03/13 1312  WBC 10.7*  NEUTROABS 8.7*  HGB 17.0  HCT 50.4  MCV 89.0  PLT 202   Cardiac Enzymes: No results for input(s): CKTOTAL, CKMB, CKMBINDEX, TROPONINI in the last 168 hours.  BNP (last 3 results)  Recent Labs  12/02/12 2159 05/28/13 1020  PROBNP 2717.0* 1463.0*   CBG:  Recent Labs Lab 11/03/13 1651  GLUCAP 353*    Radiological Exams on Admission: Dg Foot Complete Right  11/03/2013   CLINICAL DATA:  58 year old male with foot ulcer at the dorsum of the medial right foot. Progressing. Swelling. Initial encounter.  EXAM: RIGHT FOOT COMPLETE - 3+ VIEW  COMPARISON:  None.  FINDINGS: Calcified atherosclerosis tracking into the right foot and continuing distally. Calcaneus intact. Bone mineralization is within normal limits. Degenerative  changes at the first and third MTP joints. No acute fracture or dislocation identified. No osteolysis identified. No subcutaneous gas. No radiopaque foreign body identified.  IMPRESSION: 1. No acute osseous abnormality or plain radiographic evidence of osteomyelitis in the right foot. 2. Calcified peripheral vascular disease.   Electronically Signed   By: Augusto GambleLee  Hall M.D.   On: 11/03/2013 17:13    EKG: Independently reviewed. none  Assessment/Plan Cellulitis of right foot/Peripheral vascular disease: - I agree with starting vancomycin and Cipro. Blood cultures already ordered. This seems like superficial ulcers but there is also erythema around the ulcer. - Check an ABI. - x-ray was done that showed no osteomyelitis.  Coronary atherosclerosis - Symptomatic continue aspirin.  SYSTOLIC HEART FAILURE, CHRONIC - Lungs are clear no JVD his mildly hyponatremic and hypochloremic, will hold his diuretic recent in 24 hours.   Hypertension - Recent current home meds except for diuretics.  Uncontrolled diabetes mellitus type 2 with peripheral vascular disease: - last hemoglobin A1c was 10, will start him on Lantus plus sliding scale insulin. - he is not on an Ace inhibitor at home once he is volume repleted can start ace inhibitors.  Code Status: full DVT Prophylaxis:heparin Family Communication: none Disposition Plan: inpatient  Time spent: 80 minutes  Marinda ElkFELIZ ORTIZ, ABRAHAM [facility] Pager [phone]

## 2013-11-03 NOTE — ED Provider Notes (Signed)
Medical screening examination/treatment/procedure(s) were conducted as a shared visit with non-physician practitioner(s) and myself.  I personally evaluated the patient during the encounter.  Right foot infected ulcer with DM and PAD and local cellulitis; baseline neuropathy; CR<2secs toes  Feel admit reasonable.  Hurman HornJohn M Daijanae Rafalski, MD 11/04/13 [phone]

## 2013-11-04 DIAGNOSIS — L97209 Non-pressure chronic ulcer of unspecified calf with unspecified severity: Secondary | ICD-10-CM

## 2013-11-04 DIAGNOSIS — I5022 Chronic systolic (congestive) heart failure: Secondary | ICD-10-CM

## 2013-11-04 DIAGNOSIS — L03115 Cellulitis of right lower limb: Secondary | ICD-10-CM

## 2013-11-04 DIAGNOSIS — I952 Hypotension due to drugs: Secondary | ICD-10-CM

## 2013-11-04 DIAGNOSIS — L97409 Non-pressure chronic ulcer of unspecified heel and midfoot with unspecified severity: Secondary | ICD-10-CM

## 2013-11-04 LAB — HIV ANTIBODY (ROUTINE TESTING W REFLEX): HIV 1&2 Ab, 4th Generation: NONREACTIVE

## 2013-11-04 LAB — CBC
HCT: 46.6 % (ref 39.0–52.0)
Hemoglobin: 15.3 g/dL (ref 13.0–17.0)
MCH: 29.1 pg (ref 26.0–34.0)
MCHC: 32.8 g/dL (ref 30.0–36.0)
MCV: 88.8 fL (ref 78.0–100.0)
PLATELETS: 187 10*3/uL (ref 150–400)
RBC: 5.25 MIL/uL (ref 4.22–5.81)
RDW: 14.4 % (ref 11.5–15.5)
WBC: 7.3 10*3/uL (ref 4.0–10.5)

## 2013-11-04 LAB — GLUCOSE, CAPILLARY
GLUCOSE-CAPILLARY: 132 mg/dL — AB (ref 70–99)
Glucose-Capillary: 109 mg/dL — ABNORMAL HIGH (ref 70–99)
Glucose-Capillary: 125 mg/dL — ABNORMAL HIGH (ref 70–99)
Glucose-Capillary: 154 mg/dL — ABNORMAL HIGH (ref 70–99)
Glucose-Capillary: 209 mg/dL — ABNORMAL HIGH (ref 70–99)

## 2013-11-04 LAB — HEMOGLOBIN A1C
HEMOGLOBIN A1C: 12.4 % — AB (ref <5.7)
Mean Plasma Glucose: 309 mg/dL — ABNORMAL HIGH (ref <117)

## 2013-11-04 LAB — LACTIC ACID, PLASMA: Lactic Acid, Venous: 1.7 mmol/L (ref 0.5–2.2)

## 2013-11-04 LAB — BASIC METABOLIC PANEL
ANION GAP: 12 (ref 5–15)
BUN: 11 mg/dL (ref 6–23)
CHLORIDE: 101 meq/L (ref 96–112)
CO2: 26 meq/L (ref 19–32)
Calcium: 9 mg/dL (ref 8.4–10.5)
Creatinine, Ser: 0.57 mg/dL (ref 0.50–1.35)
GFR calc Af Amer: >90 mL/min (ref >90)
GFR calc non Af Amer: >90 mL/min (ref >90)
Glucose, Bld: 105 mg/dL — ABNORMAL HIGH (ref 70–99)
POTASSIUM: 3.4 meq/L — AB (ref 3.7–5.3)
SODIUM: 139 meq/L (ref 137–147)

## 2013-11-04 LAB — C-REACTIVE PROTEIN: CRP: 1.7 mg/dL — ABNORMAL HIGH (ref <0.60)

## 2013-11-04 LAB — MRSA PCR SCREENING: MRSA BY PCR: NEGATIVE

## 2013-11-04 MED ORDER — SODIUM CHLORIDE 0.9 % IV BOLUS (SEPSIS)
500.0000 mL | Freq: Once | INTRAVENOUS | Status: AC
Start: 2013-11-04 — End: 2013-11-04
  Administered 2013-11-04: 500 mL via INTRAVENOUS

## 2013-11-04 MED ORDER — SODIUM CHLORIDE 0.9 % IV BOLUS (SEPSIS)
500.0000 mL | Freq: Once | INTRAVENOUS | Status: AC
  Administered 2013-11-04: 500 mL via INTRAVENOUS

## 2013-11-04 MED ORDER — SODIUM CHLORIDE 0.9 % IV SOLN: 250.0000 mL | INTRAVENOUS | Status: DC | PRN

## 2013-11-04 MED ORDER — SODIUM CHLORIDE 0.9 % IV BOLUS (SEPSIS)
1000.0000 mL | Freq: Once | INTRAVENOUS | Status: DC
Start: 2013-11-04 — End: 2013-11-04
  Administered 2013-11-04: 1000 mL via INTRAVENOUS

## 2013-11-04 MED ORDER — POTASSIUM CHLORIDE CRYS ER 20 MEQ PO TBCR
40.0000 meq | EXTENDED_RELEASE_TABLET | Freq: Once | ORAL | Status: AC
Start: 2013-11-04 — End: 2013-11-04
  Administered 2013-11-04: 40 meq via ORAL
  Filled 2013-11-04: qty 2

## 2013-11-04 NOTE — Plan of Care (Signed)
Problem: Consults Goal: Skin Care Protocol Initiated - if Braden Score 18 or less If consults are not indicated, leave blank or document N/A Outcome: Not Applicable Date Met:  07/86/75

## 2013-11-04 NOTE — Progress Notes (Signed)
Attempted to call report to [facility]. Will call back in a few.

## 2013-11-04 NOTE — Progress Notes (Signed)
NP on call notified of pt's bp being 83/60 automatically & 74/64 manually. Pt only complains of being tired otherwise he's asymptomatic. Verbal order to recheck pt's bp at 2100 & inform NP of the results. Also to check bp q2 hrs. Will continue to monitor the pt. Sanda LingerMilam, Carmeline Kowal R, RN

## 2013-11-04 NOTE — Progress Notes (Signed)
Pt's bp was re-checked at 2100 manually and it was 92/70. Pt remains a little tired but no other symptoms. NP on call notified. No new orders given. Will continue to monitor the pt. Sanda LingerMilam, Dylen Mcelhannon R, RN

## 2013-11-04 NOTE — Plan of Care (Signed)
Problem: Consults Goal: General Medical Patient Education See Patient Education Module for specific education.  Outcome: Completed/Met Date Met:  11/04/13     

## 2013-11-04 NOTE — Progress Notes (Signed)
Pt's BP 88/55. Blake DivineAkula MD made aware. Currently awaiting further orders.

## 2013-11-04 NOTE — Progress Notes (Addendum)
[facility] PROGRESS NOTE  Dalton RainwaterLarry D [name] ZOX:[id number] DOB: 1955/02/17 DOA: 11/03/2013 PCP: Neldon LabellaMILLER,[name], MD Interim summary: Dalton RainwaterLarry D [name] is a 58 y.o. male past medical history of hypertension hyperlipidemia PAD PVD ischemic cardiomyopathy, diabetes type 2 with a last hemoglobin A1c of 10, peripheral neuropathy that comes in for a blister that started 7 days prior to admission. It later on progressed to right foot ulcer. He was admitted to medical service for IV antibiotics and wound care consulted.  Assessment/Plan: 1. Cellulitis of the right foot/ right foot ulcer:  - admitted to telemetry for borderline bp. Started on IV antibiotics. Wound care consulted. Blood cultures done and pending. Xray does not show osteomyelitis.  ABI ordered and showed moderate arterial insufficiency on the right and mild to mod arterial insufficiency on the left. He reports he follow s up with vascular surgery as outpatient.    2. Ischemic cardiomyopathy: - currently chest pain free. Holding all bp medications and diuretics for bp of 60/40's since this am.  He received one liter of fluid NS. Repeat BP in one hour. He is currently asymptomatic from the hypotension, but he is slightly lethargic.  Continue with aspirin and statin.    3. Hypokalemia: replete as needed.   4. Diabetes Mellitus: CBG (last 3)   Recent Labs  11/04/13 0021 11/04/13 0816 11/04/13 1158  GLUCAP 154* 125* 132*    Resume long acting and SSI.   Code Status: full code  Family Communication: none at bedside.  Disposition Plan: pending PT eval.   Consultants:  Wound care consult.   Procedures:  ABI  Antibiotics:  Vancomycin 11/2  Ciprofloxacin 11/2  HPI/Subjective: Alert and oriented . Pain is controlled. Denies chest pain, sob, headache, dizziness.   Objective: Filed Vitals:   11/04/13 1548  BP: 82/52  Pulse:   Temp:   Resp:     Intake/Output Summary (Last 24 hours) at 11/04/13 1647 Last data  filed at 11/04/13 1630  Gross per 24 hour  Intake   1500 ml  Output    300 ml  Net   1200 ml   Filed Weights   11/03/13 1250  Weight: 84.369 kg (186 lb)    Exam:   General:  Sleeping, but comfortable, pain is controlled with pain meds.   Cardiovascular: s1s2, no m/r/g  Respiratory: chest clear to auscultation, no wheezing or rhonchi.   Gastroenterology: soft non tender non distended bowel sounds heard.  Musculoskeletal: right foot ulcer about 4 cm with some cellulitis changes over the dorsum of the foot and leg and left foot / lateral malleolus 2 cm ulcer.   Data Reviewed: Basic Metabolic Panel:  Recent Labs Lab 11/03/13 1312 11/04/13 0640  NA 136* 139  K 5.1 3.4*  CL 93* 101  CO2 29 26  GLUCOSE 408* 105*  BUN 13 11  CREATININE 0.81 0.57  CALCIUM 9.4 9.0   Liver Function Tests:  Recent Labs Lab 11/03/13 1729  AST 19  ALT 26  ALKPHOS 164*  BILITOT 0.4  PROT 8.3  ALBUMIN 3.2*   No results for input(s): LIPASE, AMYLASE in the last 168 hours. No results for input(s): AMMONIA in the last 168 hours. CBC:  Recent Labs Lab 11/03/13 1312 11/04/13 0640  WBC 10.7* 7.3  NEUTROABS 8.7*  --   HGB 17.0 15.3  HCT 50.4 46.6  MCV 89.0 88.8  PLT 202 187   Cardiac Enzymes: No results for input(s): CKTOTAL, CKMB, CKMBINDEX, TROPONINI in the last 168 hours. BNP (  last 3 results)  Recent Labs  12/02/12 2159 05/28/13 1020  PROBNP 2717.0* 1463.0*   CBG:  Recent Labs Lab 11/03/13 1651 11/03/13 2231 11/04/13 0021 11/04/13 0816 11/04/13 1158  GLUCAP 353* 151* 154* 125* 132*    No results found for this or any previous visit (from the past 240 hour(s)).   Studies: Dg Foot Complete Right  11/03/2013   CLINICAL DATA:  58 year old male with foot ulcer at the dorsum of the medial right foot. Progressing. Swelling. Initial encounter.  EXAM: RIGHT FOOT COMPLETE - 3+ VIEW  COMPARISON:  None.  FINDINGS: Calcified atherosclerosis tracking into the right foot and  continuing distally. Calcaneus intact. Bone mineralization is within normal limits. Degenerative changes at the first and third MTP joints. No acute fracture or dislocation identified. No osteolysis identified. No subcutaneous gas. No radiopaque foreign body identified.  IMPRESSION: 1. No acute osseous abnormality or plain radiographic evidence of osteomyelitis in the right foot. 2. Calcified peripheral vascular disease.   Electronically Signed   By: Augusto GambleLee  Hall M.D.   On: 11/03/2013 17:13    Scheduled Meds: . aspirin EC  81 mg Oral Daily  . atorvastatin  80 mg Oral QODAY  . benazepril  5 mg Oral Daily  . ciprofloxacin  400 mg Intravenous Q12H  . heparin  5,000 Units Subcutaneous 3 times per day  . insulin aspart  0-15 Units Subcutaneous TID WC  . insulin aspart  0-5 Units Subcutaneous QHS  . insulin aspart  3 Units Subcutaneous TID WC  . insulin detemir  20 Units Subcutaneous QHS  . sodium chloride  1,000 mL Intravenous Once  . sodium chloride  3 mL Intravenous Q12H  . vancomycin  1,500 mg Intravenous Q12H   Continuous Infusions:   Principal Problem:   Cellulitis of right foot Active Problems:   Coronary atherosclerosis   SYSTOLIC HEART FAILURE, CHRONIC   Peripheral vascular disease   Hypertension   Cellulitis of right lower extremity    Time spent: 35 minutes.     Unico[facility] County Memorial HospitalKULA,Quin Mathenia  [facility] Pager [phone]  If 7PM-7AM, please contact night-coverage at [email or web address], password [email or web address] 11/04/2013, 4:47 PM  LOS: 1 day             Addendum: He has recevied about 2.5 liters of fluid so far for his hypotension, and he is alert oriented and conversing. He denies any symptoms. Discussed with Dr Vassie LollAlva, watch him in step down tonight and monitor.    Kathlen ModyVijaya Asta Corbridge, MD  [phone]

## 2013-11-04 NOTE — Significant Event (Signed)
Rapid Response Event Note  Overview:  Called to assess as second set of eyes for persistent low BP Time Called: 1610 Arrival Time: 1615 Event Type: Hypotension  Initial Focused Assessment:  On arrival patient sleeping arouses easily - warm and dry - pale - oriented and follows commands.  RR reg and unlabored - bil BS = few exp wheeze noted but no rales - NAD - denies SOB or pain with RR.  No JVD noted.  Denies dizziness - no CP.  Wounds noted left foot and right foot = some drainage right foot - not purlent - no odor noted.  No pedal edema noted - right foot red and edematous - inflammation evident.  Heart rate regular - 70's.  Patient with soft BP since last pm - received all cardiac and anti-hyptertensive this Am.  Has gotten 1.5 liters of NS bolus in last 3 hours.     Interventions:  Patient getting NS bolus = checked BP in both arms with manual BP cuff - correlate.  RN reports urine output ok today - 300 cc recorded.  Dr. Blake DivineAkula ordered to d/c NS bolus - patient with hx of ICM with EF 20%-25% - patient reexamined - lungs remain clear from rales - little exp wheeze - NAD = RR regular and unlabored.  No JVD.  NS stopped per order.  Within 15 minutes BP now 62/34 manual.  Patient remains asx - placed on SR 70's.  Dr. Blake DivineAkula notified - orders given.  NS bolus restarted per order Dr. Blake DivineAkula - 500 cc.  2nd IV started - #20 angio right forearm - to NS lock.  BP responding to fluid - now at 72/40 - lungs remain same.  Ephriam Knucklesaul Desi NP with PCCM in to assess patient.  After 30 mins BP up to 80/systolic.  Patient to transfer to [facility] as SDU patient.  Handoff to Home DepotCindy RN.  Patient remains asx.     Event Summary: Name of Physician Notified: Dr. Blake DivineAkula at  (pta RRT)  Name of Consulting Physician Notified: PCCM at 1720  Outcome: Transferred (Comment) ([facility] SDU)  Event End Time: [id number]  Delton PrairieBritt, Ambrea Hegler L

## 2013-11-04 NOTE — Progress Notes (Signed)
VASCULAR LAB PRELIMINARY  ARTERIAL  ABI completed:    RIGHT    LEFT    PRESSURE WAVEFORM  PRESSURE WAVEFORM  BRACHIAL 76 Triphasic BRACHIAL 76 Triphasic  DP 51 Dampened monophasic DP 58 Dampened monophasic  AT   AT    PT Noncompressible Dampened monophasic PT 59 Dampened monophasic  PER   PER    GREAT TOE  NA GREAT TOE  NA    RIGHT LEFT  ABI 0.67 0.78   The right ABI is suggestive of moderate arterial insufficiency, the left ABI is suggestive of mild, borderline moderate, arterial insufficiency.  11/04/2013 12:45 PM Gertie FeyMichelle Levante Simones, RVT, RDCS, RDMS

## 2013-11-04 NOTE — Plan of Care (Signed)
Problem: Phase I Progression Outcomes Goal: OOB as tolerated unless otherwise ordered Outcome: Progressing  Problem: Phase II Progression Outcomes Goal: Obtain order to discontinue catheter if appropriate Outcome: Not Applicable Date Met:  07/62/26  Problem: Phase III Progression Outcomes Goal: Foley discontinued Outcome: Not Applicable Date Met:  33/35/45

## 2013-11-04 NOTE — Progress Notes (Addendum)
Pt's BP still low after first 500 CC bolus. Per MD, to give another bolus. Also called Rapid Response to check on pt. Pt alert and oriented x4. No complaints of pain. Does state he is a little tired. Blake DivineAkula MD notified. .Marland Kitchen

## 2013-11-04 NOTE — Plan of Care (Signed)
Problem: Phase I Progression Outcomes Goal: Voiding-avoid urinary catheter unless indicated Outcome: Completed/Met Date Met:  11/04/13

## 2013-11-04 NOTE — Progress Notes (Signed)
Report given to [facility] 

## 2013-11-04 NOTE — Plan of Care (Signed)
Problem: Phase I Progression Outcomes Goal: Pain controlled with appropriate interventions Outcome: Completed/Met Date Met:  11/04/13     

## 2013-11-04 NOTE — Progress Notes (Signed)
Utilization review completed.  

## 2013-11-04 NOTE — Consult Note (Signed)
Name: [name] MRN: [id number] DOB: [birth year]    ADMISSION DATE:  11/03/2013 CONSULTATION DATE:  11/04/2013  REFERRING MD :  Blake Divine  CHIEF COMPLAINT:  Hypotension  BRIEF PATIENT DESCRIPTION: 58 y.o. M admitted 11/2 with RLE and LLE wounds.  That evening, he was mildly hypotensive.  He received his antihypertensive meds the following morning and was subsequently hypotensive for the remainder of the day.  He remained so after 2L IVF (SBP in low 80's); hence, PCCM was consulted.  SIGNIFICANT EVENTS  11/2 - admit 11/3 - PCCM consulted for hypotension  STUDIES:  11/2 R foot Xray >>> no osseous abnormality or evidence of osteo.   HISTORY OF PRESENT ILLNESS:  [name] is a 58 y.o. M with PMH as outlined below.  He was admitted 11/2 for LE's wounds that had progressively worsened over the few days prior to ED presentation. That night, he was mildly hypotensive (SBP in 90's).  His antihypertensives were still administered the following morning (Coreg, Benazepril) and pt subsequently remained hypotensive all day. He received 2L IVF and SBP remained in low 80's.  PCCM was consulted for further recs.  He denies any fevers/chills/sweats, confusion, lightheadedness, chest pain, SOB, wheezing, N/V/D, abdominal pain, LE swelling.    Echo back in May 2015 showed reduced EF of 20 - 25%.  PAST MEDICAL HISTORY :   has a past medical history of Hyperlipidemia; Chronic systolic heart failure; ICD (implantable cardiac defibrillator), dual, in situ; Tobacco abuse; Ischemic cardiomyopathy; Cerebrovascular disease; Diabetes mellitus; HTN (hypertension); PVD (peripheral vascular disease); Carotid artery disease; Coronary artery disease; and RIATA ICD Lead--on advisory recall ( ).  has past surgical history that includes Medtronic Virtuoso II DR dual-chamber cardioverter-defibrillation with pocket revison. Prior to Admission medications   Medication Sig Start Date End Date Taking? Authorizing Provider    aspirin 81 MG tablet Take 1 tablet (81 mg total) by mouth daily. 10/07/12  Yes [name], MD  atorvastatin (LIPITOR) 80 MG tablet Take 80 mg by mouth every other day.    Yes Historical Provider, MD  benazepril (LOTENSIN) 5 MG tablet Take 5 mg by mouth daily.   Yes Historical Provider, MD  carvedilol (COREG) 12.5 MG tablet Take 18.75 mg by mouth 2 (two) times daily with a meal.   Yes Historical Provider, MD  ezetimibe (ZETIA) 10 MG tablet Take 10 mg by mouth daily.   Yes Historical Provider, MD  glimepiride (AMARYL) 4 MG tablet Take 4 mg by mouth daily before breakfast.   Yes Historical Provider, MD  JARDIANCE 25 MG TABS tablet Take 25 mg by mouth daily. 10/13/13  Yes Historical Provider, MD  NOVOLIN N RELION 100 UNIT/ML injection Inject 18 Units into the skin daily.  03/31/13  Yes Historical Provider, MD  spironolactone (ALDACTONE) 25 MG tablet Take 1 tablet (25 mg total) by mouth daily. 01/31/13  Yes Dolores Patty, MD  torsemide (DEMADEX) 20 MG tablet Take 60 mg by mouth daily.  01/31/13  Yes Aundria Rud, NP  loratadine (CLARITIN) 10 MG tablet Take 10 mg by mouth daily as needed.     Historical Provider, MD  metolazone (ZAROXOLYN) 2.5 MG tablet Take 2.5 mg by mouth See admin instructions. Take 2.5 mg daily for 2 consecutive days (two tablets per week) if needed for excess fluid when LASIX does not work. 10/29/12   Beatrice Lecher, PA-C  polyethylene glycol (MIRALAX / GLYCOLAX) packet Take 17 g by mouth daily as needed.    Historical  Provider, MD  potassium chloride (K-DUR,KLOR-CON) 10 MEQ tablet as needed. 01/17/13   Dolores Pattyaniel R Bensimhon, MD   Allergies  Allergen Reactions  . Niacin Itching and Other (See Comments)    Flushing     FAMILY HISTORY:  family history includes Coronary artery disease in his father; Diabetes in his mother. SOCIAL HISTORY:  reports that he has been smoking Cigarettes.  He has a 33 pack-year smoking history. He has never used smokeless tobacco. He reports that  he does not drink alcohol or use illicit drugs.  REVIEW OF SYSTEMS:   All negative; except for those that are bolded, which indicate positives.  Constitutional: weight loss, weight gain, night sweats, fevers, chills, fatigue, weakness.  HEENT: headaches, sore throat, sneezing, nasal congestion, post nasal drip, difficulty swallowing, tooth/dental problems, visual complaints, visual changes, ear aches. Neuro: difficulty with speech, weakness, numbness, ataxia. CV:  chest pain, orthopnea, PND, swelling in lower extremities, dizziness, palpitations, syncope.  Resp: cough, hemoptysis, dyspnea, wheezing. GI  heartburn, indigestion, abdominal pain, nausea, vomiting, diarrhea, constipation, change in bowel habits, loss of appetite, hematemesis, melena, hematochezia.  GU: dysuria, change in color of urine, urgency or frequency, flank pain, hematuria. MSK: joint pain or swelling, decreased range of motion. Psych: change in mood or affect, depression, anxiety, suicidal ideations, homicidal ideations. Skin: rash, itching, bruising.  SUBJECTIVE:  Pt states he "feels just fine".  Denies any confusion, dizziness, lightheadedness.  Has voided 2 - 3 times today.  VITAL SIGNS: Temp:  [97.7 F (36.5 C)-98.4 F (36.9 C)] 98.1 F (36.7 C) (11/03 1407) Pulse Rate:  [71-85] 71 (11/03 1407) Resp:  [10-20] 14 (11/03 1414) BP: (59-119)/(34-71) 80/58 mmHg (11/03 1804) SpO2:  [98 %-100 %] 99 % (11/03 1407)  PHYSICAL EXAMINATION: General: WDWN male, resting in bed, in NAD. Neuro: A&O x 3, non-focal.  HEENT: /AT. PERRL, sclerae anicteric. Cardiovascular: RRR, no M/R/G.  Lungs: Respirations even and unlabored.  Faint expiratory wheezes. Abdomen: BS x 4, soft, NT/ND.  Musculoskeletal: No gross deformities, no edema.  Skin: Right foot ulcer on dorsum of foot, left lateral malleolus ulcer.     Recent Labs Lab 11/03/13 1312 11/04/13 0640  NA 136* 139  K 5.1 3.4*  CL 93* 101  CO2 29 26  BUN 13 11    CREATININE 0.81 0.57  GLUCOSE 408* 105*    Recent Labs Lab 11/03/13 1312 11/04/13 0640  HGB 17.0 15.3  HCT 50.4 46.6  WBC 10.7* 7.3  PLT 202 187   Dg Foot Complete Right  11/03/2013   CLINICAL DATA:  58 year old male with foot ulcer at the dorsum of the medial right foot. Progressing. Swelling. Initial encounter.  EXAM: RIGHT FOOT COMPLETE - 3+ VIEW  COMPARISON:  None.  FINDINGS: Calcified atherosclerosis tracking into the right foot and continuing distally. Calcaneus intact. Bone mineralization is within normal limits. Degenerative changes at the first and third MTP joints. No acute fracture or dislocation identified. No osteolysis identified. No subcutaneous gas. No radiopaque foreign body identified.  IMPRESSION: 1. No acute osseous abnormality or plain radiographic evidence of osteomyelitis in the right foot. 2. Calcified peripheral vascular disease.   Electronically Signed   By: Augusto GambleLee  Hall M.D.   On: 11/03/2013 17:13    ASSESSMENT / PLAN:  Hypotension - presumed due to hypovolemia + antihypertensive medications, doubt new or developing sepsis (afebrile / normal vitals, no leukocytosis, normal lactate, normal mental status). Plan: Transfer to SDU for closer monitoring. 500cc bolus now. If SBP does not improve to >  90, could give one additional 500cc bolus (for total of 3L - caution with aggressive resuscitation given EF 20-25%). Will accept SBP > 90 with normal mental status. Would recommend taking manual BP's. Repeat lactate. If above measures fail to correct BP, then would consider adding vasopressor support.   Rutherford Guysahul Desai, Georgia[place] - [facility] Pgr: [phone]  or [phone] 11/04/2013, 6:44 PM   Attending:  I have seen and examined the patient independently of Rahul Desai and agree with the note above.   His lungs are clear on exam, he has a slight murmur.  He is breathing comfortably and does not appear toxic right now.  Given  lack of fever and normal WBC, I don't think that this is septic shock.  Presumably this is related to his multiple BP meds and diuretics (received coreg and lisinopril today)  Will repeat lactic acid for re-assurance. He notes that his blood pressure sometimes runs low at home.  Will monitor in step down.  As long as he is making urine and his lactic acid is normal will tolerate SBP as low as 90.  Continue IVF overnight.  Heber CarolinaBrent McQuaid, MD [facility] Pager: [phone] Cell: [phone] If no response, call [phone]

## 2013-11-05 LAB — GLUCOSE, CAPILLARY
GLUCOSE-CAPILLARY: 104 mg/dL — AB (ref 70–99)
GLUCOSE-CAPILLARY: 110 mg/dL — AB (ref 70–99)
Glucose-Capillary: 143 mg/dL — ABNORMAL HIGH (ref 70–99)
Glucose-Capillary: 96 mg/dL (ref 70–99)

## 2013-11-05 MED ORDER — COLLAGENASE 250 UNIT/GM EX OINT
TOPICAL_OINTMENT | Freq: Every day | CUTANEOUS | Status: DC
  Administered 2013-11-05 – 2013-11-11 (×6): via TOPICAL
  Administered 2013-11-12: 1 via TOPICAL
  Filled 2013-11-05 (×4): qty 30

## 2013-11-05 NOTE — Consult Note (Addendum)
WOC wound consult note Reason for Consult: Consult requested for BLE wounds.  Pt recently had a blister to right foot which has declined this week.  He states he has chronic wounds to left leg. Wound type: Full thickness Measurement: Left outer ankle 1X1cm, 100% dry yellow slough, no odor or drainage Left calf with several dry scabbed areas without open wounds or drainage.  3.5X1.5, .3X.3cm, 1.5X1.5cm. No topical treatment needed for these sites at this time. Wound bed: Right foot with previous blister which has ruptured and evolved into dry eschar. 6X4cm, no odor or drainage. Pt states previous erythremia has improved since IV antibiotics were started. Dressing procedure/placement/frequency: Santyl ointment for chemical debridement of nonviable tissue to left outer ankle and right foot wound. ABI results are low; .67 to right and .76 for left.  This decreased perfusion can impair healing. Pt may benefit from vascular consult for further plan of care and follow-up. Please re-consult if further assistance is needed.  Thank-you,  Cammie Mcgeeawn Loella Hickle MSN, RN, CWOCN, [place] Ridge[place]CN-AP, CNS [phone]

## 2013-11-05 NOTE — Progress Notes (Addendum)
[facility] PROGRESS NOTE  Dalton RainwaterLarry D [name] ION:[id number] DOB: September 30, 1955 DOA: 11/03/2013 PCP: [name] Interim summary: Dalton RainwaterLarry D [name] is a 58 y.o. male past medical history of hypertension hyperlipidemia PAD PVD ischemic cardiomyopathy, diabetes type 2 with a last hemoglobin A1c of 10, peripheral neuropathy that comes in for a blister that started 7 days prior to admission. It later on progressed to right foot ulcer. He was admitted to medical service for IV antibiotics and wound care consulted.   Assessment/Plan: 1. Cellulitis of the right foot/ Diabetic right foot ulcer:  - admitted to telemetry for borderline bp. Started on IV antibiotics. Wound care consultation appreciated. Blood cultures negative to date. Xray does not show osteomyelitis.  ABI ordered and showed moderate arterial insufficiency on the right and mild to mod arterial insufficiency on the left. He reports he follow s up with vascular surgery as outpatient. In a.m., will request vascular consultation while inpatient.   2. Ischemic cardiomyopathy: Patient's antihypertensives and diuretics were held secondary to hypotension. blood pressures remain soft but stable. Continue with aspirin and statin.    3. Hypokalemia: replete as needed.   4. Diabetes Mellitus: CBG (last 3)   Recent Labs  11/05/13 0742 11/05/13 1118 11/05/13 1654  GLUCAP 104* 143* 96    Resume long acting and SSI.   5. Essential HTN: as above   Code Status: full code  Family Communication: none at bedside.  Disposition Plan: pending PT eval.   Consultants:  Wound care consult.   Procedures:  ABI  Antibiotics:  Vancomycin 11/2  Ciprofloxacin 11/2  HPI/Subjective: Patient states that right foot ulcer pain and drainage have significantly improved.  Objective: Filed Vitals:   11/05/13 1655  BP: 111/81  Pulse: 81  Temp: 98.5 F (36.9 C)  Resp: 23    Intake/Output Summary (Last 24 hours) at 11/05/13 1820 Last  data filed at 11/05/13 1243  Gross per 24 hour  Intake    843 ml  Output    825 ml  Net     18 ml   Filed Weights   11/03/13 1250 11/04/13 1941 11/05/13 0352  Weight: 84.369 kg (186 lb) 84.3 kg (185 lb 13.6 oz) 84.5 kg (186 lb 4.6 oz)    Exam:   General:  Patient lying comfortably in bed.  Cardiovascular: s1s2, no m/r/g. No JVD or pedal edema. Telemetry: Sinus rhythm with BBB morphology.  Respiratory: chest clear to auscultation, no wheezing or rhonchi.   Gastroenterology: soft non tender non distended bowel sounds heard.  Musculoskeletal: right foot ulcer about 4 cm with some cellulitis changes over the dorsum of the foot and leg and left foot / lateral malleolus 2 cm ulcer-dried and without acute findings..   Data Reviewed: Basic Metabolic Panel:  Recent Labs Lab 11/03/13 1312 11/04/13 0640  NA 136* 139  K 5.1 3.4*  CL 93* 101  CO2 29 26  GLUCOSE 408* 105*  BUN 13 11  CREATININE 0.81 0.57  CALCIUM 9.4 9.0   Liver Function Tests:  Recent Labs Lab 11/03/13 1729  AST 19  ALT 26  ALKPHOS 164*  BILITOT 0.4  PROT 8.3  ALBUMIN 3.2*   No results for input(s): LIPASE, AMYLASE in the last 168 hours. No results for input(s): AMMONIA in the last 168 hours. CBC:  Recent Labs Lab 11/03/13 1312 11/04/13 0640  WBC 10.7* 7.3  NEUTROABS 8.7*  --   HGB 17.0 15.3  HCT 50.4 46.6  MCV 89.0 88.8  PLT 202 187  Cardiac Enzymes: No results for input(s): CKTOTAL, CKMB, CKMBINDEX, TROPONINI in the last 168 hours. BNP (last 3 results)  Recent Labs  12/02/12 2159 05/28/13 1020  PROBNP 2717.0* 1463.0*   CBG:  Recent Labs Lab 11/04/13 1648 11/04/13 2055 11/05/13 0742 11/05/13 1118 11/05/13 1654  GLUCAP 109* 209* 104* 143* 96    Recent Results (from the past 240 hour(s))  Blood Cultures x 2 sites     Status: None (Preliminary result)   Collection Time: 11/03/13  6:19 PM  Result Value Ref Range Status   Specimen Description BLOOD FOREARM LEFT  Final    Special Requests BOTTLES DRAWN AEROBIC AND ANAEROBIC 5CC  Final   Culture  Setup Time   Final    11/04/2013 00:53 [facility]formed at [facility]    Culture   Final           BLOOD CULTURE RECEIVED NO GROWTH TO DATE CULTURE WILL BE HELD FOR 5 DAYS BEFORE ISSUING A FINAL NEGATIVE REPORT Performed at [facility]    Report Status PENDING  Incomplete  Blood Cultures x 2 sites     Status: None (Preliminary result)   Collection Time: 11/03/13  6:25 PM  Result Value Ref Range Status   Specimen Description BLOOD FOREARM RIGHT  Final   Special Requests BOTTLES DRAWN AEROBIC AND ANAEROBIC 5CC  Final   Culture  Setup Time   Final    11/04/2013 00:53 Performed at [facility]    Culture   Final           BLOOD CULTURE RECEIVED NO GROWTH TO DATE CULTURE WILL BE HELD FOR 5 DAYS BEFORE ISSUING A FINAL NEGATIVE REPORT Performed at [facility]    Report Status PENDING  Incomplete  MRSA PCR Screening     Status: None   Collection Time: 11/04/13 10:15 PM  Result Value Ref Range Status   MRSA by PCR NEGATIVE NEGATIVE Final    Comment:        The GeneXpert MRSA Assay (FDA approved for NASAL specimens only), is one component of a comprehensive MRSA colonization surveillance program. It is not intended to diagnose MRSA infection nor to guide or monitor treatment for MRSA infections.      Studies: No results found.  Scheduled Meds: . aspirin EC  81 mg Oral Daily  . atorvastatin  80 mg Oral QODAY  . benazepril  5 mg Oral Daily  . ciprofloxacin  400 mg Intravenous Q12H  . collagenase   Topical Daily  . heparin  5,000 Units Subcutaneous 3 times per day  . insulin aspart  0-15 Units Subcutaneous TID WC  . insulin aspart  0-5 Units Subcutaneous QHS  . insulin aspart  3 Units Subcutaneous TID WC  . insulin detemir  20 Units Subcutaneous QHS  . sodium chloride  3 mL Intravenous Q12H  . vancomycin  1,500 mg Intravenous Q12H   Continuous Infusions:   Principal  Problem:   Cellulitis of right foot Active Problems:   Coronary atherosclerosis   SYSTOLIC HEART FAILURE, CHRONIC   Peripheral vascular disease   Hypertension   Cellulitis of right lower extremity    Time spent: 35 minutes.    Marcellus ScottHONGALGI,ANAND, [name], FACP, FHM. [facility] Pager [phone]  If 7PM-7AM, please contact night-coverage [email or web address] Password [email or web address] 11/05/2013, 6:25 PM    LOS: 2 days

## 2013-11-06 ENCOUNTER — Encounter (HOSPITAL_COMMUNITY)
Admission: EM | Disposition: A | Discharge status: Home / Self Care | Payer: Self-pay | Source: Home / Self Care | Attending: Internal Medicine

## 2013-11-06 DIAGNOSIS — I2510 Atherosclerotic heart disease of native coronary artery without angina pectoris: Secondary | ICD-10-CM

## 2013-11-06 DIAGNOSIS — I739 Peripheral vascular disease, unspecified: Secondary | ICD-10-CM | POA: Insufficient documentation

## 2013-11-06 DIAGNOSIS — I70235 Atherosclerosis of native arteries of right leg with ulceration of other part of foot: Secondary | ICD-10-CM

## 2013-11-06 DIAGNOSIS — Z01810 Encounter for preprocedural cardiovascular examination: Secondary | ICD-10-CM

## 2013-11-06 HISTORY — PX: LOWER EXTREMITY ANGIOGRAM: SHX5508

## 2013-11-06 HISTORY — PX: ABDOMINAL AORTAGRAM: SHX5454

## 2013-11-06 LAB — BASIC METABOLIC PANEL
Anion gap: 12 (ref 5–15)
BUN: 16 mg/dL (ref 6–23)
CO2: 24 meq/L (ref 19–32)
CREATININE: 0.83 mg/dL (ref 0.50–1.35)
Calcium: 8.8 mg/dL (ref 8.4–10.5)
Chloride: 100 mEq/L (ref 96–112)
GFR calc Af Amer: >90 mL/min (ref >90)
GFR calc non Af Amer: >90 mL/min (ref >90)
GLUCOSE: 85 mg/dL (ref 70–99)
Potassium: 4 mEq/L (ref 3.7–5.3)
SODIUM: 136 meq/L — AB (ref 137–147)

## 2013-11-06 LAB — GLUCOSE, CAPILLARY
GLUCOSE-CAPILLARY: 174 mg/dL — AB (ref 70–99)
GLUCOSE-CAPILLARY: 174 mg/dL — AB (ref 70–99)
Glucose-Capillary: 92 mg/dL (ref 70–99)
Glucose-Capillary: 104 mg/dL — ABNORMAL HIGH (ref 70–99)

## 2013-11-06 LAB — CREATININE, SERUM
Creatinine, Ser: 0.68 mg/dL (ref 0.50–1.35)
GFR calc Af Amer: >90 mL/min (ref >90)
GFR calc non Af Amer: >90 mL/min (ref >90)

## 2013-11-06 LAB — CBC
HCT: 43 % (ref 39.0–52.0)
Hemoglobin: 13.9 g/dL (ref 13.0–17.0)
MCH: 28.6 pg (ref 26.0–34.0)
MCHC: 32.3 g/dL (ref 30.0–36.0)
MCV: 88.5 fL (ref 78.0–100.0)
PLATELETS: 169 10*3/uL (ref 150–400)
RBC: 4.86 MIL/uL (ref 4.22–5.81)
RDW: 14.5 % (ref 11.5–15.5)
WBC: 6.5 10*3/uL (ref 4.0–10.5)

## 2013-11-06 LAB — VANCOMYCIN, TROUGH: Vancomycin Tr: 17.9 ug/mL (ref 10.0–20.0)

## 2013-11-06 SURGERY — ABDOMINAL AORTAGRAM
Anesthesia: LOCAL

## 2013-11-06 MED ORDER — LIDOCAINE HCL (PF) 1 % IJ SOLN
INTRAMUSCULAR | Status: AC
  Filled 2013-11-06: qty 30

## 2013-11-06 MED ORDER — SODIUM CHLORIDE 0.9 % IV SOLN: 1.0000 mL/kg/h | INTRAVENOUS | Status: AC

## 2013-11-06 MED ORDER — FENTANYL CITRATE 0.05 MG/ML IJ SOLN
INTRAMUSCULAR | Status: AC
  Administered 2013-11-07: 50 ug via INTRAVENOUS
  Filled 2013-11-06: qty 2

## 2013-11-06 MED ORDER — EZETIMIBE 10 MG PO TABS
10.0000 mg | ORAL_TABLET | Freq: Every day | ORAL | Status: DC
  Administered 2013-11-06 – 2013-11-12 (×6): 10 mg via ORAL
  Filled 2013-11-06 (×6): qty 1

## 2013-11-06 MED ORDER — TORSEMIDE 20 MG PO TABS
20.0000 mg | ORAL_TABLET | Freq: Every day | ORAL | Status: DC
  Administered 2013-11-06 – 2013-11-08 (×2): 20 mg via ORAL
  Filled 2013-11-06 (×4): qty 1

## 2013-11-06 MED ORDER — HEPARIN (PORCINE) IN NACL 2-0.9 UNIT/ML-% IJ SOLN
INTRAMUSCULAR | Status: AC
  Filled 2013-11-06: qty 500

## 2013-11-06 MED ORDER — MIDAZOLAM HCL 2 MG/2ML IJ SOLN
INTRAMUSCULAR | Status: AC
  Administered 2013-11-07: 0.5 mg via INTRAVENOUS
  Filled 2013-11-06: qty 2

## 2013-11-06 MED ORDER — CEFAZOLIN SODIUM 1-5 GM-% IV SOLN
1.0000 g | INTRAVENOUS | Status: AC
  Administered 2013-11-07: 1 g via INTRAVENOUS
  Filled 2013-11-06: qty 50

## 2013-11-06 NOTE — Consult Note (Signed)
[facility] Consult  Agree with noted below. This patient has extensive wounds on both feet. The wound over the right great toe measures 6 cm x 4 cm and is a full-thickness wound. The wound adjacent to the left lateral malleolus is 1 cm x 1 cm and is likely quite close to the bone. This too is a full-thickness wound. On exam he has evidence of multilevel arterial occlusive disease and also significant chronic venous insufficiency. I have explained to him that this is clearly a limb threatening problem. I recommended we proceed with arteriography in order to evaluate his options for revascularization. I have reviewed with the patient the indications for arteriography. In addition, I have reviewed the potential complications of arteriography including but not limited to: Bleeding, arterial injury, arterial thrombosis, dye action, renal insufficiency, or other unpredictable medical problems. I have explained to the patient that if we find disease amenable to angioplasty we could potentially address this at the same time. I have discussed the potential complications of angioplasty and stenting, including but not limited to: Bleeding, arterial thrombosis, arterial injury, dissection, or the need for surgical intervention. I will make further recommendations pending the results of his arteriogram.  Waverly Ferrari, MD, FACS Beeper [phone] 11 AM   Reason for Consult: Bilateral lower extremity wounds Referring Physician: [facility] MRN #: [id number]  History of Present Illness: This is a 58 y.o. male with bilateral ankle wounds for two weeks, right worse than left. He says they both started out as blisters that eventually "popped" and have progressively worsened. He notes purulent drainage from the wounds. He has a prior history of lower extremity wounds that have healed with no complications. He describes having pain in his feet "all day and night" with cramping  above the ankles when he walks. He is able to ambulate without assistance. He denies any calf claudication. He has chronic numbness of his feet. He also notes lower extremity swelling.   He has a past medical history of PVD, CAD s/p 3 vessel stenting, ischemic cardiomyopathy, CHF and hypertension managed on a BB and ACEI. He has insulin dependent diabetes. He has hyperlipidemia managed with a statin. He takes a daily aspirin. He is a current smoker.   Past Medical History  Diagnosis Date  . Hyperlipidemia     mixed  . Chronic systolic heart failure   . ICD (implantable cardiac defibrillator), dual, in situ     St. Jude for severe LVD EF 25% 2/07 explanted 2010. Medtronic Virtuoso II DR Dual-chamber cardiverter-defibrillator with pocket revision, Dr. Graciela Husbands  . Tobacco abuse   . Ischemic cardiomyopathy     a. Echo (09/27/12): EF 20%, diffuse HK with periapical AK, no LV thrombus noted, restrictive physiology, trivial MR, mild LAE, RVSF mildly reduced, PASP 39  . Cerebrovascular disease   . Diabetes mellitus   . HTN (hypertension)   . PVD (peripheral vascular disease)     a. ABI 09/2011 - R normal, L moderate - saw Dr. Kirke Corin - med rx.   . Carotid artery disease     a. Dopp 09/2011: 60-79% RICA, 40-59% LICA.; b. Carotid US (7/14): Bilateral 60-79% => f/u 6 mos  . Coronary artery disease     a. AWMI requiring IABP 2005 s/p Horizon study stent-LAD, staged BMS-Cx, DESx2-RCA. b. Last Last LHC (6/06): EF 25%, pLAD stent 40-50, after stent 40, dLAD 20, pCFX 20, pOM1 70, pRCA 20, mRCA stents ok.=> med Rx; c. Myoview (7/14): EF 26%, large  ant, septal, inf and apical infarct, no ischemia. Med Rx continued  . RIATA ICD Lead--on advisory recall    Past Surgical History  Procedure Laterality Date  . Medtronic virtuoso ii dr dual-chamber cardioverter-defibrillation with pocket revison      Dr. Sherryl MangesSteven Klein    Allergies    Allergen Reactions  . Niacin Itching and Other (See Comments)    Flushing     Prior to Admission medications   Medication Sig Start Date End Date Taking? Authorizing Provider  aspirin 81 MG tablet Take 1 tablet (81 mg total) by mouth daily. 10/07/12  Yes Duke SalviaSteven C Klein, MD  atorvastatin (LIPITOR) 80 MG tablet Take 80 mg by mouth every other day.    Yes Historical Provider, MD  benazepril (LOTENSIN) 5 MG tablet Take 5 mg by mouth daily.   Yes Historical Provider, MD  carvedilol (COREG) 12.5 MG tablet Take 18.75 mg by mouth 2 (two) times daily with a meal.   Yes Historical Provider, MD  ezetimibe (ZETIA) 10 MG tablet Take 10 mg by mouth daily.   Yes Historical Provider, MD  glimepiride (AMARYL) 4 MG tablet Take 4 mg by mouth daily before breakfast.   Yes Historical Provider, MD  JARDIANCE 25 MG TABS tablet Take 25 mg by mouth daily. 10/13/13  Yes Historical Provider, MD  NOVOLIN N RELION 100 UNIT/ML injection Inject 18 Units into the skin daily.  03/31/13  Yes Historical Provider, MD  spironolactone (ALDACTONE) 25 MG tablet Take 1 tablet (25 mg total) by mouth daily. 01/31/13  Yes Dolores Pattyaniel R Bensimhon, MD  torsemide (DEMADEX) 20 MG tablet Take 60 mg by mouth daily.  01/31/13  Yes Aundria RudAli B Cosgrove, NP  loratadine (CLARITIN) 10 MG tablet Take 10 mg by mouth daily as needed.     Historical Provider, MD  metolazone (ZAROXOLYN) 2.5 MG tablet Take 2.5 mg by mouth See admin instructions. Take 2.5 mg daily for 2 consecutive days (two tablets per week) if needed for excess fluid when LASIX does not work. 10/29/12   Beatrice LecherScott T Weaver, PA-C  polyethylene glycol (MIRALAX / GLYCOLAX) packet Take 17 g by mouth daily as needed.    Historical Provider, MD  potassium chloride (K-DUR,KLOR-CON) 10 MEQ tablet as needed. 01/17/13   Dolores Pattyaniel R Bensimhon, MD    History   Social History  . Marital Status: Single     Spouse Name: N/A    Number of Children: N/A  . Years of Education: N/A   Occupational History  . [employer]    Full time   Social History Main Topics  . Smoking status: Current Some Day Smoker -- 1.00 packs/day for 33 years    Types: Cigarettes  . Smokeless tobacco: Never Used     Comment: smoker for 33 years  . Alcohol Use: No  . Drug Use: No     Comment: former Cocain, Acid, Marijuana - 25 years ago  . Sexual Activity: No   Other Topics Concern  . Not on file   Social History Narrative   Divorced.   He has a family history of coronary artery disease. His father passed of a heart attack in his 750s.   Family History  Problem Relation Age of Onset  . Diabetes Mother   . Coronary artery disease Father     ROS: [x]  Positive [ ]  Negative [ ]  All sytems reviewed and are negative  Cardiovascular: []  chest pain/pressure []  palpitations []  SOB lying flat []  DOE [x]  pain in  legs while walking [x]  pain in legs at rest [x]  pain in legs at night [x]  non-healing ulcers []  hx of DVT [x]  swelling in legs  Pulmonary: []  productive cough []  asthma/wheezing []  home O2  Neurologic: []  weakness in []  arms []  legs []  numbness in []  arms []  legs []  hx of CVA []  mini stroke [] difficulty speaking or slurred speech []  temporary loss of vision in one eye []  dizziness  Hematologic: []  hx of cancer []  bleeding problems []  problems with blood clotting easily  Endocrine:  [x]  diabetes []  thyroid disease  GI []  vomiting blood []  blood in stool  GU: []  CKD/renal failure []  HD--[]  M/W/F or []  T/T/S []  burning with urination []  blood in urine  Psychiatric: []  anxiety []  depression  Musculoskeletal: []  arthritis []  joint pain  Integumentary: []  rashes [x]  ulcers  Constitutional: []  fever []  chills   Physical Examination  Filed Vitals:   11/06/13 0559  BP:   Pulse:     Temp: 98.3 F (36.8 C)  Resp:    Body mass index is 25.26 kg/(m^2).  General: WDWN in NAD, looks older than stated age.  Gait: Not observed HENT: WNL, normocephalic Pulmonary: normal non-labored breathing, without Rales, rhonchi, wheezing Cardiac: regular, without Murmurs, rubs or gallops; without carotid bruits Abdomen: soft, NT/ND, no masses Skin: with rashes, with ulcers; See extremity exam below.  Vascular Exam/Pulses: non palpable femoral, popliteal and pedal pulses bilaterally.  Extremities: bilateral lower extremity edema, right greater than left. 2+ capillary refill. 1 x 1 cm yellow ulcer of left lateral malleolus. No drainage no odor. Right 6 x 4 cm ulcer of dorsum of foot with mild yellow drainage. No odor. Erythema of right foot. Dark brown hemosiderin staining lower legs bilaterally. Multiple areas of dry eschar on anterior shins.  Musculoskeletal: no muscle wasting or atrophy Neurologic: A&O X 3; Appropriate Affect ; SENSATION: normal; MOTOR FUNCTION: moving all extremities equally. Speech is fluent/normal  CBC  Labs (Brief)       Component Value Date/Time   WBC 7.3 11/04/2013 0640   RBC 5.25 11/04/2013 0640   HGB 15.3 11/04/2013 0640   HCT 46.6 11/04/2013 0640   PLT 187 11/04/2013 0640   MCV 88.8 11/04/2013 0640   MCH 29.1 11/04/2013 0640   MCHC 32.8 11/04/2013 0640   RDW 14.4 11/04/2013 0640   LYMPHSABS 1.1 11/03/2013 1312   MONOABS 0.7 11/03/2013 1312   EOSABS 0.2 11/03/2013 1312   BASOSABS 0.0 11/03/2013 1312      BMET  Labs (Brief)       Component Value Date/Time   NA 136* 11/06/2013 0506   K 4.0 11/06/2013 0506   CL 100 11/06/2013 0506   CO2 24 11/06/2013 0506   GLUCOSE 85 11/06/2013 0506   BUN 16 11/06/2013 0506   CREATININE 0.83 11/06/2013 0506   CALCIUM 8.8 11/06/2013 0506   GFRNONAA >90 11/06/2013 0506   GFRAA >90 11/06/2013  0506      COAGS:  Recent Labs    No results found for: INR, PROTIME    ABIs (11/04/13)  RIGHT    LEFT    PRESSURE WAVEFORM  PRESSURE WAVEFORM  BRACHIAL 76 Triphasic BRACHIAL 76 Triphasic  DP 51 Dampened monophasic DP 58 Dampened monophasic  AT   AT    PT Noncompressible Dampened monophasic PT 59 Dampened monophasic  PER   PER    GREAT TOE  NA GREAT TOE  NA    RIGHT LEFT  ABI 0.67 0.78  ASSESSMENT: This is a 58 y.o. 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   Other active problems: DM Ischemic cardiomyopathy  HTN  PLAN: -PV lab today for aortogram with bilateral lower extremity runoff with Dr. Edilia Boickson to further evaluate arterial disease.  -Negative blood cultures to date.  -CXR negative for osteomyelitis -On cipro and vanco for cellulitis.  -Keep NPO.   Maris BergerKimberly Trinh, PA-C [facility] Office: [phone] Pager: [phone]        Cosigned by: Chuck Hinthristopher S Elizabeth Paulsen, MD at 11/06/2013 10:53 AM  Revision History     Date/Time User Provider Type Action   11/06/2013 10:53 AM Chuck Hinthristopher S Aspyn Warnke, MD Physician Cosign   11/06/2013 9:27 AM Raymond GurneyKimberly A Trinh, PA-C Physician Assistant Certified Sign    Routing History     Date/Time From To Method   11/06/2013 10:53 AM Chuck Hinthristopher S Tekesha Almgren, MD Sigmund HazelLisa [name], MD Fax

## 2013-11-06 NOTE — Progress Notes (Signed)
VASCULAR SURGERY  This patient has limb threatening ischemia of the right lower extremity with a nonhealing wound of the right great toe which is fairly extensive. He has been evaluated by cardiology and felt to be high risk for surgery. However no further cardiac workup has been recommended. I discussed this with the patient and he is willing to accept the risk given that without revascularization he will likely require amputation. I have recommended a right femoropopliteal bypass graft tomorrow. We will use vein if it is usable otherwise we would do the bypass with a prosthetic graft. I have reviewed the indications for lower extremity bypass. I have also reviewed the potential complications of surgery including but not limited to: wound healing problems, infection, graft thrombosis, limb loss, or other unpredictable medical problems. All the patient's questions were answered and they are agreeable to proceed.  Arianis Bowditch, MD, FACS Beeper [phone] 11/06/2013   

## 2013-11-06 NOTE — Progress Notes (Addendum)
[facility] PROGRESS NOTE  [name] ZOX:[id number] DOB: 04/11/1955 DOA: 11/03/2013 PCP: Neldon LabellaMILLER,[name], MD Interim summary: [name] is a 58 y.o. male past medical history of hypertension hyperlipidemia PAD PVD ischemic cardiomyopathy, diabetes type 2 with a last hemoglobin A1c of 10, peripheral neuropathy that comes in for a blister that started 7 days prior to admission. It later on progressed to right foot ulcer. He was admitted to medical service for IV antibiotics and wound care consulted.   Assessment/Plan:  1. Diabetic right foot ulcer with cellulitis: admitted to telemetry for borderline bp. Started on IV antibiotics-vancomycin and Cipro. Wound care consultation appreciated. Blood cultures negative to date. Xray does not show osteomyelitis. ABI ordered and showed moderate arterial insufficiency on the right and mild to mod arterial insufficiency on the left. Vascular surgery input appreciated-please see below. 2. PAD: Vascular surgery consulted and patient underwent aortogram with bilateral iliac arteriogram and bilateral lower extremity runoff on 11/5. He has significant PAD and as per vascular surgery, without revascularization, he is at significant risk for limb loss. However at the same time patient is a high cardiac risk as per cardiology consultation. Await vascular surgery follow-up regarding possible right femoropopliteal bypass graft versus angioplasty and stenting. 3. Ischemic cardiomyopathy/Chronic systolic CHF/CAD/AICD: Patient's antihypertensives and diuretics were held secondary to hypotension. As per cardiology, 2-D echo with LVEF 20-25 percent-stable from prior, nonischemic stress test in 7/14, ICD interrogation October 2015 showed no ventricular arrhythmias. Will resume diuretics. Continue with aspirin, ACEI and statin. 4. Hypokalemia: replete as needed.  5. Diabetes Mellitus:   Reasonable inpatient control. Continue Levemir and SSI. 6. Essential HTN: soft blood  pressures. 7. History of hyperlipidemia: 8. History of tobacco abuse: Cessation counseled   Code Status: full code  Family Communication: none at bedside.  Disposition Plan: home when medically stable   Consultants:  Wound care consult.   Vascular surgery  Cardiology  Procedures:  ABI  Aortogram with bilateral iliac arteriogram and bilateral lower extremity runoff on 11/5  Antibiotics:  Vancomycin 11/2>  Ciprofloxacin 11/2>  HPI/Subjective: Patient states that right foot ulcer pain and drainage have significantly improved.  Objective:  Filed Vitals:   11/06/13 1235 11/06/13 1240 11/06/13 1245 11/06/13 1250  BP: 89/62 90/63 101/67   Pulse: 73 75 73 74  Temp:      TempSrc:      Resp: 30 14 15 20   Height:      Weight:      SpO2: 99% 100% 100% 100%     Intake/Output Summary (Last 24 hours) at 11/06/13 1837 Last data filed at 11/06/13 1756  Gross per 24 hour  Intake    600 ml  Output    780 ml  Net   -180 ml   Filed Weights   11/03/13 1250 11/04/13 1941 11/05/13 0352  Weight: 84.369 kg (186 lb) 84.3 kg (185 lb 13.6 oz) 84.5 kg (186 lb 4.6 oz)    Exam:   General:  Patient lying comfortably in bed.  Cardiovascular: s1s2, no m/r/g. No JVD or pedal edema. Telemetry: Sinus rhythm with BBB morphology.  Respiratory: chest clear to auscultation, no wheezing or rhonchi.   Gastroenterology: soft non tender non distended bowel sounds heard.  Musculoskeletal: right foot ulcer about 4 cm with some cellulitis changes over the dorsum of the foot and leg and left foot / lateral malleolus 2 cm ulcer-dried and without acute findings. Absent bilateral dorsalis pedis and posterior tibial pulsations.  Data Reviewed: Basic Metabolic Panel:  Recent Labs Lab 11/03/13 1312 11/04/13 0640 11/06/13 0506 11/06/13 1545  NA 136* 139 136*  --   K 5.1 3.4* 4.0  --   CL 93* 101 100  --   CO2 29 26 24   --   GLUCOSE 408* 105* 85  --   BUN 13 11 16   --   CREATININE 0.81  0.57 0.83 0.68  CALCIUM 9.4 9.0 8.8  --    Liver Function Tests:  Recent Labs Lab 11/03/13 1729  AST 19  ALT 26  ALKPHOS 164*  BILITOT 0.4  PROT 8.3  ALBUMIN 3.2*   No results for input(s): LIPASE, AMYLASE in the last 168 hours. No results for input(s): AMMONIA in the last 168 hours. CBC:  Recent Labs Lab 11/03/13 1312 11/04/13 0640 11/06/13 1545  WBC 10.7* 7.3 6.5  NEUTROABS 8.7*  --   --   HGB 17.0 15.3 13.9  HCT 50.4 46.6 43.0  MCV 89.0 88.8 88.5  PLT 202 187 169   Cardiac Enzymes: No results for input(s): CKTOTAL, CKMB, CKMBINDEX, TROPONINI in the last 168 hours. BNP (last 3 results)  Recent Labs  12/02/12 2159 05/28/13 1020  PROBNP 2717.0* 1463.0*   CBG:  Recent Labs Lab 11/05/13 1654 11/05/13 2107 11/06/13 0741 11/06/13 1218 11/06/13 1627  GLUCAP 96 110* 92 104* 174*    Recent Results (from the past 240 hour(s))  Blood Cultures x 2 sites     Status: None (Preliminary result)   Collection Time: 11/03/13  6:19 PM  Result Value Ref Range Status   Specimen Description BLOOD FOREARM LEFT  Final   Special Requests BOTTLES DRAWN AEROBIC AND ANAEROBIC 5CC  Final   Culture  Setup Time   Final    11/04/2013 00:53 Performed at [facility]    Culture   Final           BLOOD CULTURE RECEIVED NO GROWTH TO DATE CULTURE WILL BE HELD FOR 5 DAYS BEFORE ISSUING A FINAL NEGATIVE REPORT Performed at [facility]    Report Status PENDING  Incomplete  Blood Cultures x 2 sites     Status: None (Preliminary result)   Collection Time: 11/03/13  6:25 PM  Result Value Ref Range Status   Specimen Description BLOOD FOREARM RIGHT  Final   Special Requests BOTTLES DRAWN AEROBIC AND ANAEROBIC 5CC  Final   Culture  Setup Time   Final    11/04/2013 00:53 Performed at [facility]    Culture   Final           BLOOD CULTURE RECEIVED NO GROWTH TO DATE CULTURE WILL BE HELD FOR 5 DAYS BEFORE ISSUING A FINAL NEGATIVE REPORT Performed at [facility]    Report Status PENDING  Incomplete  MRSA PCR Screening     Status: None   Collection Time: 11/04/13 10:15 PM  Result Value Ref Range Status   MRSA by PCR NEGATIVE NEGATIVE Final    Comment:        The GeneXpert MRSA Assay (FDA approved for NASAL specimens only), is one component of a comprehensive MRSA colonization surveillance program. It is not intended to diagnose MRSA infection nor to guide or monitor treatment for MRSA infections.      Studies: No results found.  Scheduled Meds: . aspirin EC  81 mg Oral Daily  . atorvastatin  80 mg Oral QODAY  . benazepril  5 mg Oral Daily  . ciprofloxacin  400 mg Intravenous Q12H  .  collagenase   Topical Daily  . heparin  5,000 Units Subcutaneous 3 times per day  . insulin aspart  0-15 Units Subcutaneous TID WC  . insulin aspart  0-5 Units Subcutaneous QHS  . insulin aspart  3 Units Subcutaneous TID WC  . insulin detemir  20 Units Subcutaneous QHS  . sodium chloride  3 mL Intravenous Q12H  . vancomycin  1,500 mg Intravenous Q12H   Continuous Infusions:   Principal Problem:   Cellulitis of right foot Active Problems:   Coronary atherosclerosis   SYSTOLIC HEART FAILURE, CHRONIC   Peripheral vascular disease   Hypertension   Cellulitis of right lower extremity    Time spent: 35 minutes.    Marcellus ScottHONGALGI,Hedy Garro, MD, FACP, FHM. [facility] Pager [phone]  If 7PM-7AM, please contact night-coverage [email or web address] Password [email or web address] 11/06/2013, 6:37 PM    LOS: 3 days

## 2013-11-06 NOTE — Progress Notes (Signed)
Site area: rfa Site Prior to Removal:  Level  0 Pressure Applied For:20 min Manual:   y Patient Status During Pull:  stable Post Pull Site:  Level Post Pull Instructions Given:  y Post Pull Pulses Present: doppler Dressing Applied: clear  Bedrest begins @1120   Comments: Consulting civil engineerbyMaria RN

## 2013-11-06 NOTE — Progress Notes (Signed)
ANTIBIOTIC CONSULT NOTE - FOLLOW UP  Pharmacy Consult for Vancomycin and Cipro Indication: non-healing R foot ulcer  Allergies  Allergen Reactions  . Niacin Itching and Other (See Comments)    Flushing     Patient Measurements: Height: 6' (182.9 cm) Weight: 186 lb 4.6 oz (84.5 kg) IBW/kg (Calculated) : 77.6  Vital Signs: Temp: 98.3 F (36.8 C) (11/05 0559) Temp Source: Oral (11/05 0559) BP: 101/67 mmHg (11/05 1245) Pulse Rate: 74 (11/05 1250) Intake/Output from previous day: 11/04 0701 - 11/05 0700 In: 603 [P.O.:600; I.V.:3] Out: 200 [Urine:200] Intake/Output from this shift: Total I/O In: 120 [P.O.:120] Out: 580 [Urine:580]  Labs:  Recent Labs  11/04/13 0640 11/06/13 0506  WBC 7.3  --   HGB 15.3  --   PLT 187  --   CREATININE 0.57 0.83   Estimated Creatinine Clearance: 105.2 mL/min (by C-G formula based on Cr of 0.83).  Recent Labs  11/06/13 0520  VANCOTROUGH 17.9     Microbiology: Recent Results (from the past 720 hour(s))  Blood Cultures x 2 sites     Status: None (Preliminary result)   Collection Time: 11/03/13  6:19 PM  Result Value Ref Range Status   Specimen Description BLOOD FOREARM LEFT  Final   Special Requests BOTTLES DRAWN AEROBIC AND ANAEROBIC 5CC  Final   Culture  Setup Time   Final    11/04/2013 00:53 [facility]    Culture   Final           BLOOD CULTURE RECEIVED NO GROWTH TO DATE CULTURE WILL BE HELD FOR 5 DAYS BEFORE ISSUING A FINAL NEGATIVE REPORT [facility]    Report Status PENDING  Incomplete  Blood Cultures x 2 sites     Status: None (Preliminary result)   Collection Time: 11/03/13  6:25 PM  Result Value Ref Range Status   Specimen Description BLOOD FOREARM RIGHT  Final   Special Requests BOTTLES DRAWN AEROBIC AND ANAEROBIC 5CC  Final   Culture  Setup Time   Final    11/04/2013 00:53 [facility]    Culture   Final           BLOOD CULTURE RECEIVED NO  GROWTH TO DATE CULTURE WILL BE HELD FOR 5 DAYS BEFORE ISSUING A FINAL NEGATIVE REPORT [facility]    Report Status PENDING  Incomplete  MRSA PCR Screening     Status: None   Collection Time: 11/04/13 10:15 PM  Result Value Ref Range Status   MRSA by PCR NEGATIVE NEGATIVE Final    Comment:        The GeneXpert MRSA Assay (FDA approved for NASAL specimens only), is one component of a comprehensive MRSA colonization surveillance program. It is not intended to diagnose MRSA infection nor to guide or monitor treatment for MRSA infections.     Anti-infectives    Start     Dose/Rate Route Frequency Ordered Stop   11/03/13 1800  vancomycin (VANCOCIN) 1,500 mg in sodium chloride 0.9 % 500 mL IVPB     1,500 mg250 mL/hr over 120 Minutes Intravenous Every 12 hours 11/03/13 1744     11/03/13 1800  ciprofloxacin (CIPRO) IVPB 400 mg     400 mg200 mL/hr over 60 Minutes Intravenous Every 12 hours 11/03/13 1744        [name] continues on day#4 IV antibiotics for R foot ulcer.  Awaiting decisions for revascularization.  WBC is  trending down on IV abx.  Renal function is stable.  Vancomycin trough is therapeutic on current regimen.  Goal of Therapy:  Vancomycin trough level 15-20 mcg/ml  Plan:  Continue Vancomycin 1500 mg IV Q 12 hours Continue Ciprofloxacin 400 mg IV Q 12 hours Follow up vascular plans, cx data, and length of therapy  [name], Pharm.D., BCPS Clinical Pharmacist Pager [phone] 11/06/2013 1:27 PM

## 2013-11-06 NOTE — Consult Note (Deleted)
Vascular and [facility] Consult  Reason for Consult:  Bilateral lower extremity wounds Referring Physician: [facility] MRN #:  [id number]  History of Present Illness: This is a 58 y.o. male with bilateral ankle wounds for two weeks, right worse than left. He says they both started out as blisters that eventually "popped" and have progressively worsened. He notes purulent drainage from the wounds. He has a prior history of lower extremity wounds that have healed with no complications. He describes having pain in his feet "all day and night" with cramping above the ankles when he walks. He is able to ambulate without assistance.  He denies any calf claudication. He has chronic numbness of his feet. He also notes lower extremity swelling.   He has a past medical history of PVD,  CAD s/p 3 vessel stenting, ischemic cardiomyopathy, CHF and hypertension managed on a BB and ACEI. He has insulin dependent diabetes. He has hyperlipidemia managed with a statin. He takes a daily aspirin. He is a current smoker.   Past Medical History  Diagnosis Date  . Hyperlipidemia     mixed  . Chronic systolic heart failure   . ICD (implantable cardiac defibrillator), dual, in situ     St. Jude for severe LVD EF 25% 2/07 explanted 2010. Medtronic Virtuoso II DR Dual-chamber cardiverter-defibrillator  with pocket revision, Dr. Graciela Husbands  . Tobacco abuse   . Ischemic cardiomyopathy     a. Echo (09/27/12): EF 20%, diffuse HK with periapical AK, no LV thrombus noted, restrictive physiology, trivial MR, mild LAE, RVSF mildly reduced, PASP 39  . Cerebrovascular disease   . Diabetes mellitus   . HTN (hypertension)   . PVD (peripheral vascular disease)     a. ABI 09/2011 - R normal, L moderate - saw Dr. Kirke Corin - med rx.   . Carotid artery disease     a. Dopp 09/2011: 60-79% RICA, 40-59% LICA.;  b.  Carotid US (7/14):  Bilateral 60-79% => f/u 6 mos  . Coronary artery disease     a. AWMI requiring IABP  2005 s/p Horizon study stent-LAD, staged BMS-Cx, DESx2-RCA. b. Last Last LHC (6/06):  EF 25%, pLAD stent 40-50, after stent 40, dLAD 20, pCFX 20, pOM1 70, pRCA 20, mRCA stents ok.=> med Rx;  c.  Myoview (7/14):  EF 26%, large ant, septal, inf and apical infarct, no ischemia.  Med Rx continued  . RIATA ICD Lead--on advisory recall     Past Surgical History  Procedure Laterality Date  . Medtronic virtuoso ii dr dual-chamber cardioverter-defibrillation with pocket revison      Dr. Sherryl Manges    Allergies  Allergen Reactions  . Niacin Itching and Other (See Comments)    Flushing     Prior to Admission medications   Medication Sig Start Date End Date Taking? Authorizing Provider  aspirin 81 MG tablet Take 1 tablet (81 mg total) by mouth daily. 10/07/12  Yes [name], MD  atorvastatin (LIPITOR) 80 MG tablet Take 80 mg by mouth every other day.    Yes Historical Provider, MD  benazepril (LOTENSIN) 5 MG tablet Take 5 mg by mouth daily.   Yes Historical Provider, MD  carvedilol (COREG) 12.5 MG tablet Take 18.75 mg by mouth 2 (two) times daily with a meal.   Yes Historical Provider, MD  ezetimibe (ZETIA) 10 MG tablet Take 10 mg by mouth daily.   Yes Historical Provider, MD  glimepiride (AMARYL) 4 MG tablet Take 4 mg by mouth  daily before breakfast.   Yes Historical Provider, MD  JARDIANCE 25 MG TABS tablet Take 25 mg by mouth daily. 10/13/13  Yes Historical Provider, MD  NOVOLIN N RELION 100 UNIT/ML injection Inject 18 Units into the skin daily.  03/31/13  Yes Historical Provider, MD  spironolactone (ALDACTONE) 25 MG tablet Take 1 tablet (25 mg total) by mouth daily. 01/31/13  Yes Dolores Pattyaniel R Bensimhon, MD  torsemide (DEMADEX) 20 MG tablet Take 60 mg by mouth daily.  01/31/13  Yes Aundria RudAli B Cosgrove, NP  loratadine (CLARITIN) 10 MG tablet Take 10 mg by mouth daily as needed.     Historical Provider, MD  metolazone (ZAROXOLYN) 2.5 MG tablet Take 2.5 mg by mouth See admin instructions. Take 2.5 mg daily  for 2 consecutive days (two tablets per week) if needed for excess fluid when LASIX does not work. 10/29/12   Beatrice LecherScott T Weaver, PA-C  polyethylene glycol (MIRALAX / GLYCOLAX) packet Take 17 g by mouth daily as needed.    Historical Provider, MD  potassium chloride (K-DUR,KLOR-CON) 10 MEQ tablet as needed. 01/17/13   Dolores Pattyaniel R Bensimhon, MD    History   Social History  . Marital Status: Single    Spouse Name: N/A    Number of Children: N/A  . Years of Education: N/A   Occupational History  . [employer]    Full time   Social History Main Topics  . Smoking status: Current Some Day Smoker -- 1.00 packs/day for 33 years    Types: Cigarettes  . Smokeless tobacco: Never Used     Comment: smoker for 33 years  . Alcohol Use: No  . Drug Use: No     Comment: former Cocain, Acid, Marijuana - 25 years ago  . Sexual Activity: No   Other Topics Concern  . Not on file   Social History Narrative   Divorced.   He has a family history of coronary artery disease. His father passed of a heart attack in his 5450s.   Family History  Problem Relation Age of Onset  . Diabetes Mother   . Coronary artery disease Father     ROS: [x]  Positive   [ ]  Negative   [ ]  All sytems reviewed and are negative  Cardiovascular: []  chest pain/pressure []  palpitations []  SOB lying flat []  DOE [x]  pain in legs while walking [x]  pain in legs at rest [x]  pain in legs at night [x]  non-healing ulcers []  hx of DVT [x]  swelling in legs  Pulmonary: []  productive cough []  asthma/wheezing []  home O2  Neurologic: []  weakness in []  arms []  legs []  numbness in []  arms []  legs []  hx of CVA []  mini stroke [] difficulty speaking or slurred speech []  temporary loss of vision in one eye []  dizziness  Hematologic: []  hx of cancer []  bleeding problems []  problems with blood clotting easily  Endocrine:   [x]  diabetes []  thyroid disease  GI []  vomiting blood []  blood in stool  GU: []  CKD/renal failure []   HD--[]  M/W/F or []  T/T/S []  burning with urination []  blood in urine  Psychiatric: []  anxiety []  depression  Musculoskeletal: []  arthritis []  joint pain  Integumentary: []  rashes [x]  ulcers  Constitutional: []  fever []  chills   Physical Examination  Filed Vitals:   11/06/13 0559  BP:   Pulse:   Temp: 98.3 F (36.8 C)  Resp:    Body mass index is 25.26 kg/(m^2).  General:  WDWN in NAD, looks older than stated  age.  Gait: Not observed HENT: WNL, normocephalic Pulmonary: normal non-labored breathing, without Rales, rhonchi,  wheezing Cardiac: regular, without  Murmurs, rubs or gallops; without carotid bruits Abdomen: soft, NT/ND, no masses Skin: with rashes, with ulcers; See extremity exam below.  Vascular Exam/Pulses: non palpable femoral, popliteal and pedal pulses bilaterally.  Extremities: bilateral lower extremity edema, right greater than left. 2+ capillary refill.  1 x 1 cm yellow ulcer of left lateral malleolus. No drainage no odor. Right 6 x 4 cm ulcer of dorsum of foot with mild yellow drainage. No odor. Erythema of right foot.  Dark brown hemosiderin staining lower legs bilaterally. Multiple areas of dry eschar on anterior shins.  Musculoskeletal: no muscle wasting or atrophy  Neurologic: A&O X 3; Appropriate Affect ; SENSATION: normal; MOTOR FUNCTION:  moving all extremities equally. Speech is fluent/normal  CBC    Component Value Date/Time   WBC 7.3 11/04/2013 0640   RBC 5.25 11/04/2013 0640   HGB 15.3 11/04/2013 0640   HCT 46.6 11/04/2013 0640   PLT 187 11/04/2013 0640   MCV 88.8 11/04/2013 0640   MCH 29.1 11/04/2013 0640   MCHC 32.8 11/04/2013 0640   RDW 14.4 11/04/2013 0640   LYMPHSABS 1.1 11/03/2013 1312   MONOABS 0.7 11/03/2013 1312   EOSABS 0.2 11/03/2013 1312   BASOSABS 0.0 11/03/2013 1312    BMET    Component Value Date/Time   NA 136* 11/06/2013 0506   K 4.0 11/06/2013 0506   CL 100 11/06/2013 0506   CO2 24 11/06/2013 0506    GLUCOSE 85 11/06/2013 0506   BUN 16 11/06/2013 0506   CREATININE 0.83 11/06/2013 0506   CALCIUM 8.8 11/06/2013 0506   GFRNONAA >90 11/06/2013 0506   GFRAA >90 11/06/2013 0506    COAGS: No results found for: INR, PROTIME  ABIs (11/04/13)  RIGHT    LEFT    PRESSURE WAVEFORM  PRESSURE WAVEFORM  BRACHIAL 76 Triphasic BRACHIAL 76 Triphasic  DP 51 Dampened monophasic DP 58 Dampened monophasic  AT   AT    PT Noncompressible Dampened monophasic PT 59 Dampened monophasic  PER   PER    GREAT TOE  NA GREAT TOE  NA    RIGHT LEFT  ABI 0.67 0.78     ASSESSMENT: This is a 58 y.o. 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   Other active problems: DM Ischemic cardiomyopathy  HTN  PLAN: -PV lab today for aortogram with bilateral lower extremity runoff with Dr. Edilia Boickson to further evaluate arterial disease.  -Negative blood cultures to date.  -CXR negative for osteomyelitis -On cipro and vanco for cellulitis.  -Keep NPO.   Maris BergerKimberly Manali Mcelmurry, PA-C [facility] Office: [phone] Pager: [phone]

## 2013-11-06 NOTE — Op Note (Addendum)
PATIENT: [name]   MRN: [id number] DOB: 21-Sep-1955    DATE OF PROCEDURE: 11/06/2013  INDICATIONS: [name] is a 58 y.o. male who was admitted on 11/03/13 with a right foot ulcer. He has an extensive wound on the dorsum of his right great toe which measures approximate 4 x 6 cm. On exam he has evidence of multilevel arterial occlusive disease. He presents for arteriography in order to evaluate his options for revascularization.  PROCEDURE:  1. Ultrasound-guided access to the right common femoral artery 2. Aortogram with bilateral iliac arteriogram and bilateral lower extremity runoff  SURGEON: Di Kindlehristopher S. Edilia Boickson, MD, FACS  ANESTHESIA: local with sedation   EBL: minimal  TECHNIQUE: The patient was taken to the peripheral vascular lab and sedated with 1 mg of Versed and 50 g of fentanyl. Both groins were prepped and draped in usual sterile fashion. Under ultrasound guidance, after the skin was anesthetized, the right common femoral artery was cannulated with a micropuncture needle and a micropuncture sheath introduced over the wire. I then advanced a versa core wire through the micropuncture sheath and this was then exchanged for a 5 French sheath. A pigtail catheter was positioned at the L1 vertebral body and flush aortogram obtained. The catheter was in position above the aortic bifurcation and oblique iliac projections were obtained. Bilateral lower extremity runoff films were then obtained. There was a mild external iliac artery stenosis on the right and I did measure a pressure in the pigtail catheter in the aorta and compared this to the pressure in the right femoral artery did not appear to be a significant gradient at rest. I likewise did a pullback pressure with a straight catheter and there was no significant pressure gradient at rest. The patient was transferred to the holding area for removal of the sheath to completion of the procedure.  FINDINGS:  1. There are single renal  arteries bilaterally with no significant renal artery stenosis identified. The infrarenal aorta is patent with diffuse mild irregularity but no focal stenosis. 2. On the right side, the common iliac artery and hypogastric artery are patent. The right external iliac artery is patent with a mild stenosis of probably 20-30% but no pressure gradient across this. The common femoral and deep femoral artery are patent. The superficial femoral artery is occluded proximally but there is a short segment of the SFA which would potentially allow cannulation for an endovascular approach on the right. The superficial femoral artery is occluded with reconstitution of the above-knee popliteal artery on the right just above the patella. The popliteal artery is patent and there is three-vessel runoff on the right via the anterior tibial, posterior tibial, NP regarding arteries. There is some disease in the proximal anterior tibial artery and also some disease in the tibial peroneal trunk. 3. On the left side, the common iliac, hypogastric, external iliac arteries are patent. The left common femoral artery and deep femoral artery are patent. The superficial femoral artery is occluded at its origin. There is reconstitution of the above-knee pop through artery on the left with 2 vessel runoff via the peroneal and posterior tibial arteries. The anterior tibial artery is occluded on the left. The proximal posterior tibial artery has severe diffuse disease in the artery is small and diffusely diseased beyond this.   CLINICAL NOTE: This patient has a significant cardiac history. He has an AICD and significant CHF with an ischemic cardiomyopathy. If he is felt to be high risk for a right  femoropopliteal bypass graft then he could potentially be considered for angioplasty and stenting of the long superficial femoral artery occlusion. In reviewing the records, it appears that he saw Dr. Kirke CorinArida back in 2013. If he is felt to be a reasonable  candidate for surgery we could try to schedule him for a right femoropopliteal bypass graft. I think without revascularization he is at significant risk for limb loss given the extensive wound on his right great toe. In addition, he has a wound on his left lateral malleolus which will need to follow closely.  Dalton Ferrarihristopher Dickson, MD, FACS [facility]  DATE OF DICTATION:   11/06/2013

## 2013-11-06 NOTE — Consult Note (Signed)
Reason for Consult: Pre-Op Clearance Referring Physician:   [name] is an 58 y.o. male.  HPI:   The patient is a 58 yo male with a history of CAD.  S/P ant MI in 2005 req IABP tx with Horizon study stent to the LAD followed by staged BMS to the CFX and DES x 2 to RCA, Ischemic CM, systolic CHF, s/p AICD, HTN, DM2, HLD, carotid stenosis, PAD, tobacco abuse.  His last LHC (6/06): EF 25%, pLAD stent 40-50%, after stent 40%, dLAD 20%, pCFX 20%, pOM1 70%, pRCA 20%, mRCA stents ok. Abdominal US (2/11): No AAA. Myoview (7/14): EF 26%, large ant, septal, inf and apical infarct, no ischemia. Med Rx continued. Carotid US (1/15): Bilateral 60-79% => f/u 6 mos. ABIs February 2015 showed mild reduction on the right and mild to moderate reduction on the left. Waveform analysis suggests moderate to severe disease bilaterally. Echocardiogram 05/28/13: EF of 20-25%. G2DD.  Moderate MR. He also is followed in congestive heart failure clinic.  Most recent ICD interrogation was 10/20/13 and the device was functioning normally.  There was no evidence of ventricular arrhythmia.  He was admitted with a foot ulcer on right foot and lateral malleolus of the left.  He underwent PV angio with Dr. Scot Dock revealing and occluded Proximal right SFA, occluded left SFA at the origin and occlude left ant tibial.  We are asked to see for preop clearance for right femoropopliteal bypass graft.  Dr. Scot Dock feels he is at risk for limb loss.  The patient reports developing the wounds about three weeks ago.  The left one is stable but the right has gotten worse and red.   He works at [employer] and is on his feet all the time.  His legs ache constantly.  The patient currently denies nausea, vomiting, fever, chest pain, shortness of breath beyond usual, orthopnea, dizziness, PND, cough, congestion, abdominal pain, hematochezia, melena.  Past Medical History  Diagnosis Date  . Hyperlipidemia     mixed  . Chronic systolic heart  failure   . ICD (implantable cardiac defibrillator), dual, in situ     St. Jude for severe LVD EF 25% 2/07 explanted 2010. Medtronic Virtuoso II DR Dual-chamber cardiverter-defibrillator  with pocket revision, Dr. Caryl Comes  . Tobacco abuse   . Ischemic cardiomyopathy     a. Echo (09/27/12): EF 20%, diffuse HK with periapical AK, no LV thrombus noted, restrictive physiology, trivial MR, mild LAE, RVSF mildly reduced, PASP 39  . Cerebrovascular disease   . Diabetes mellitus   . HTN (hypertension)   . PVD (peripheral vascular disease)     a. ABI 09/2011 - R normal, L moderate - saw Dr. Fletcher Anon - med rx.   . Carotid artery disease     a. Dopp 09/2011: 32-67% RICA, 12-45% LICA.;  b.  Carotid US (7/14):  Bilateral 60-79% => f/u 6 mos  . Coronary artery disease     a. AWMI requiring IABP 2005 s/p Horizon study stent-LAD, staged BMS-Cx, DESx2-RCA. b. Last Last LHC (6/06):  EF 25%, pLAD stent 40-50, after stent 40, dLAD 20, pCFX 20, pOM1 70, pRCA 20, mRCA stents ok.=> med Rx;  c.  Myoview (7/14):  EF 26%, large ant, septal, inf and apical infarct, no ischemia.  Med Rx continued  . RIATA ICD Lead--on advisory recall      Past Surgical History  Procedure Laterality Date  . Medtronic virtuoso ii dr dual-chamber cardioverter-defibrillation with pocket revison  Dr. Virl Axe    Family History  Problem Relation Age of Onset  . Diabetes Mother   . Coronary artery disease Father     Social History:  reports that he has been smoking Cigarettes.  He has a 33 pack-year smoking history. He has never used smokeless tobacco. He reports that he does not drink alcohol or use illicit drugs.  Allergies:  Allergies  Allergen Reactions  . Niacin Itching and Other (See Comments)    Flushing     Medications: Scheduled Meds: . aspirin EC  81 mg Oral Daily  . atorvastatin  80 mg Oral QODAY  . benazepril  5 mg Oral Daily  . ciprofloxacin  400 mg Intravenous Q12H  . collagenase   Topical Daily  . heparin   5,000 Units Subcutaneous 3 times per day  . insulin aspart  0-15 Units Subcutaneous TID WC  . insulin aspart  0-5 Units Subcutaneous QHS  . insulin aspart  3 Units Subcutaneous TID WC  . insulin detemir  20 Units Subcutaneous QHS  . sodium chloride  3 mL Intravenous Q12H  . vancomycin  1,500 mg Intravenous Q12H   Continuous Infusions: . sodium chloride 1 mL/kg/hr (11/06/13 1247)   PRN Meds:.acetaminophen **OR** acetaminophen, HYDROcodone-acetaminophen, ondansetron **OR** ondansetron (ZOFRAN) IV, polyethylene glycol, sodium chloride   Results for orders placed or performed during the hospital encounter of 11/03/13 (from the past 48 hour(s))  Glucose, capillary     Status: Abnormal   Collection Time: 11/04/13  4:48 PM  Result Value Ref Range   Glucose-Capillary 109 (H) 70 - 99 mg/dL  Lactic acid, plasma     Status: None   Collection Time: 11/04/13  8:35 PM  Result Value Ref Range   Lactic Acid, Venous 1.7 0.5 - 2.2 mmol/L  Glucose, capillary     Status: Abnormal   Collection Time: 11/04/13  8:55 PM  Result Value Ref Range   Glucose-Capillary 209 (H) 70 - 99 mg/dL  MRSA PCR Screening     Status: None   Collection Time: 11/04/13 10:15 PM  Result Value Ref Range   MRSA by PCR NEGATIVE NEGATIVE    Comment:        The GeneXpert MRSA Assay (FDA approved for NASAL specimens only), is one component of a comprehensive MRSA colonization surveillance program. It is not intended to diagnose MRSA infection nor to guide or monitor treatment for MRSA infections.   Glucose, capillary     Status: Abnormal   Collection Time: 11/05/13  7:42 AM  Result Value Ref Range   Glucose-Capillary 104 (H) 70 - 99 mg/dL  Glucose, capillary     Status: Abnormal   Collection Time: 11/05/13 11:18 AM  Result Value Ref Range   Glucose-Capillary 143 (H) 70 - 99 mg/dL   Comment 1 Documented in Chart    Comment 2 Notify RN   Glucose, capillary     Status: None   Collection Time: 11/05/13  4:54 PM    Result Value Ref Range   Glucose-Capillary 96 70 - 99 mg/dL  Glucose, capillary     Status: Abnormal   Collection Time: 11/05/13  9:07 PM  Result Value Ref Range   Glucose-Capillary 110 (H) 70 - 99 mg/dL  Basic metabolic panel     Status: Abnormal   Collection Time: 11/06/13  5:06 AM  Result Value Ref Range   Sodium 136 (L) 137 - 147 mEq/L   Potassium 4.0 3.7 - 5.3 mEq/L   Chloride 100 96 -  112 mEq/L   CO2 24 19 - 32 mEq/L   Glucose, Bld 85 70 - 99 mg/dL   BUN 16 6 - 23 mg/dL   Creatinine, Ser 0.83 0.50 - 1.35 mg/dL   Calcium 8.8 8.4 - 10.5 mg/dL   GFR calc non Af Amer >90 >90 mL/min   GFR calc Af Amer >90 >90 mL/min    Comment: (NOTE) The eGFR has been calculated using the CKD EPI equation. This calculation has not been validated in all clinical situations. eGFR's persistently <90 mL/min signify possible Chronic Kidney Disease.    Anion gap 12 5 - 15  Vancomycin, trough     Status: None   Collection Time: 11/06/13  5:20 AM  Result Value Ref Range   Vancomycin Tr 17.9 10.0 - 20.0 ug/mL  Glucose, capillary     Status: None   Collection Time: 11/06/13  7:41 AM  Result Value Ref Range   Glucose-Capillary 92 70 - 99 mg/dL    No results found.  Review of Systems  Constitutional: Negative for fever and diaphoresis.  Respiratory: Positive for shortness of breath (None more than usual). Negative for cough.   Cardiovascular: Positive for claudication and leg swelling. Negative for chest pain, palpitations, orthopnea and PND.  Gastrointestinal: Negative for nausea, vomiting, abdominal pain, blood in stool and melena.  Genitourinary: Negative for hematuria.  Neurological: Negative for dizziness and weakness.  All other systems reviewed and are negative.  Blood pressure 101/67, pulse 74, temperature 98.3 F (36.8 C), temperature source Oral, resp. rate 20, height 6' (1.829 m), weight 186 lb 4.6 oz (84.5 kg), SpO2 100 %. Physical Exam  Nursing note and vitals  reviewed. Constitutional: He is oriented to person, place, and time. He appears well-developed and well-nourished. No distress.  HENT:  Head: Normocephalic and atraumatic.  Eyes: EOM are normal. Pupils are equal, round, and reactive to light. No scleral icterus.  Neck: Normal range of motion. Neck supple. JVD present.  Cardiovascular: Normal rate, regular rhythm, S1 normal and S2 normal.   Murmur heard.  Systolic murmur is present with a grade of 1/6  Pulses:      Radial pulses are 2+ on the right side, and 2+ on the left side.  No Carotid bruit  Respiratory: Effort normal and breath sounds normal. He has no wheezes. He has no rales.  GI: Soft. Bowel sounds are normal. He exhibits no distension. There is no tenderness.  Musculoskeletal: He exhibits edema (Trace).  Neurological: He is alert and oriented to person, place, and time. He exhibits normal muscle tone.  Skin: Skin is warm and dry.  Psychiatric: He has a normal mood and affect.    Assessment/Plan: Active Problems:   Cellulitis of right foot    PreOp Clearance He will be high risk for surgery given is poor EF, CAD history and co-morbidities.  Recent echo stable from prior.   He had a nonischemic stress test in 07/2012 and appears well compensated and stable.  ICD interrogation in Oct 2015 showed no ventricular arrhythmia.   No angina and he seems to have a fairly physical job.      Coronary atherosclerosis     SYSTOLIC HEART FAILURE, CHRONIC He takes torsemide, spironolactone and PRN metolazone at home.  He has not received any of these medications and appears to be doing well.  He is, however, getting post cath hydration and his JVD is elevated.  Need to watch for sudden change in status.  If he gets dyspneic,  will likely need IV lasix.  Strict I/O's, Daily weights.  On ACE-I    Ischemic cardiomyopathy, EF 20-25%   Peripheral vascular disease   Hypertension  Low.  On Ace.     Cellulitis of right lower extremity   DM,  Uncontrolled   HAGER, BRYAN, PAC 11/06/2013, 2:51 PM   Personally seen and examined. Agree with above. There is no cardiac contraindication to surgery however he remains high risk for procedure. Volume status. NUC stress 2014 large scar with no ischemia. I do not see the need for cardiac cath. Not having angina. Will follow.   Candee Furbish, MD

## 2013-11-07 ENCOUNTER — Inpatient Hospital Stay (HOSPITAL_COMMUNITY): Payer: BC Managed Care – PPO | Admitting: Anesthesiology

## 2013-11-07 ENCOUNTER — Encounter (HOSPITAL_COMMUNITY)
Admission: EM | Disposition: A | Discharge status: Home / Self Care | Payer: Self-pay | Source: Home / Self Care | Attending: Internal Medicine

## 2013-11-07 ENCOUNTER — Encounter (HOSPITAL_COMMUNITY): Payer: Self-pay | Admitting: Certified Registered Nurse Anesthetist

## 2013-11-07 ENCOUNTER — Inpatient Hospital Stay (HOSPITAL_COMMUNITY): Payer: BC Managed Care – PPO

## 2013-11-07 DIAGNOSIS — N19 Unspecified kidney failure: Secondary | ICD-10-CM

## 2013-11-07 DIAGNOSIS — I739 Peripheral vascular disease, unspecified: Secondary | ICD-10-CM | POA: Diagnosis present

## 2013-11-07 HISTORY — PX: FEMORAL-POPLITEAL BYPASS GRAFT: SHX937

## 2013-11-07 HISTORY — PX: INTRAOPERATIVE ARTERIOGRAM: SHX5157

## 2013-11-07 LAB — POCT I-STAT GLUCOSE
Glucose, Bld: 130 mg/dL — ABNORMAL HIGH (ref 70–99)
Glucose, Bld: 129 mg/dL — ABNORMAL HIGH (ref 70–99)
OPERATOR ID: 354581
OPERATOR ID: 354581

## 2013-11-07 LAB — GLUCOSE, CAPILLARY
GLUCOSE-CAPILLARY: 153 mg/dL — AB (ref 70–99)
GLUCOSE-CAPILLARY: 103 mg/dL — AB (ref 70–99)
Glucose-Capillary: 88 mg/dL (ref 70–99)
Glucose-Capillary: 64 mg/dL — ABNORMAL LOW (ref 70–99)
Glucose-Capillary: 88 mg/dL (ref 70–99)
Glucose-Capillary: 106 mg/dL — ABNORMAL HIGH (ref 70–99)

## 2013-11-07 SURGERY — BYPASS GRAFT FEMORAL-POPLITEAL ARTERY
Anesthesia: General | Site: Leg Upper | Laterality: Right

## 2013-11-07 MED ORDER — LACTATED RINGERS IV SOLN
INTRAVENOUS | Status: DC
  Administered 2013-11-07 (×2): via INTRAVENOUS

## 2013-11-07 MED ORDER — DEXTROSE 50 % IV SOLN
INTRAVENOUS | Status: AC
  Filled 2013-11-07: qty 50

## 2013-11-07 MED ORDER — GLYCOPYRROLATE 0.2 MG/ML IJ SOLN
INTRAMUSCULAR | Status: AC
  Filled 2013-11-07: qty 3

## 2013-11-07 MED ORDER — PROTAMINE SULFATE 10 MG/ML IV SOLN
INTRAVENOUS | Status: DC | PRN
  Administered 2013-11-07 (×2): 10 mg via INTRAVENOUS
  Administered 2013-11-07: 20 mg via INTRAVENOUS

## 2013-11-07 MED ORDER — ONDANSETRON HCL 4 MG/2ML IJ SOLN
INTRAMUSCULAR | Status: AC
  Filled 2013-11-07: qty 2

## 2013-11-07 MED ORDER — ARTIFICIAL TEARS OP OINT
TOPICAL_OINTMENT | OPHTHALMIC | Status: AC
  Filled 2013-11-07: qty 3.5

## 2013-11-07 MED ORDER — THROMBIN 20000 UNITS EX SOLR
CUTANEOUS | Status: AC
  Filled 2013-11-07: qty 20000

## 2013-11-07 MED ORDER — PAPAVERINE HCL 30 MG/ML IJ SOLN
INTRAMUSCULAR | Status: AC
  Filled 2013-11-07: qty 2

## 2013-11-07 MED ORDER — ROCURONIUM BROMIDE 100 MG/10ML IV SOLN
INTRAVENOUS | Status: DC | PRN
  Administered 2013-11-07: 40 mg via INTRAVENOUS

## 2013-11-07 MED ORDER — LIDOCAINE HCL (CARDIAC) 20 MG/ML IV SOLN
INTRAVENOUS | Status: DC | PRN
  Administered 2013-11-07: 20 mg via INTRAVENOUS
  Administered 2013-11-07: 60 mg via INTRATRACHEAL

## 2013-11-07 MED ORDER — ETOMIDATE 2 MG/ML IV SOLN
INTRAVENOUS | Status: DC | PRN
  Administered 2013-11-07: 20 mg via INTRAVENOUS

## 2013-11-07 MED ORDER — ONDANSETRON HCL 4 MG/2ML IJ SOLN
INTRAMUSCULAR | Status: DC | PRN
  Administered 2013-11-07: 4 mg via INTRAVENOUS

## 2013-11-07 MED ORDER — SODIUM CHLORIDE 0.9 % IV SOLN: 500.0000 mL | Freq: Once | INTRAVENOUS | Status: AC | PRN

## 2013-11-07 MED ORDER — SENNOSIDES-DOCUSATE SODIUM 8.6-50 MG PO TABS
1.0000 | ORAL_TABLET | Freq: Every evening | ORAL | Status: DC | PRN
  Filled 2013-11-07: qty 1

## 2013-11-07 MED ORDER — MAGNESIUM SULFATE 2 GM/50ML IV SOLN
2.0000 g | Freq: Every day | INTRAVENOUS | Status: DC | PRN
  Filled 2013-11-07: qty 50

## 2013-11-07 MED ORDER — FENTANYL CITRATE 0.05 MG/ML IJ SOLN
INTRAMUSCULAR | Status: DC | PRN
  Administered 2013-11-07: 50 ug via INTRAVENOUS
  Administered 2013-11-07: 100 ug via INTRAVENOUS

## 2013-11-07 MED ORDER — PAPAVERINE HCL 30 MG/ML IJ SOLN
INTRAMUSCULAR | Status: DC | PRN
  Administered 2013-11-07: 60 mg via INTRAVENOUS

## 2013-11-07 MED ORDER — CARVEDILOL 6.25 MG PO TABS
6.2500 mg | ORAL_TABLET | Freq: Two times a day (BID) | ORAL | Status: DC
  Administered 2013-11-07 – 2013-11-12 (×7): 6.25 mg via ORAL
  Filled 2013-11-07 (×13): qty 1

## 2013-11-07 MED ORDER — MORPHINE SULFATE 2 MG/ML IJ SOLN
2.0000 mg | INTRAMUSCULAR | Status: DC | PRN
  Administered 2013-11-08 (×2): 2 mg via INTRAVENOUS
  Filled 2013-11-07 (×2): qty 1

## 2013-11-07 MED ORDER — ETOMIDATE 2 MG/ML IV SOLN
INTRAVENOUS | Status: AC
  Filled 2013-11-07: qty 10

## 2013-11-07 MED ORDER — METOPROLOL TARTRATE 1 MG/ML IV SOLN: 2.0000 mg | INTRAVENOUS | Status: DC | PRN

## 2013-11-07 MED ORDER — PROPOFOL 10 MG/ML IV BOLUS
INTRAVENOUS | Status: AC
  Filled 2013-11-07: qty 20

## 2013-11-07 MED ORDER — CARVEDILOL 6.25 MG PO TABS: 6.2500 mg | ORAL_TABLET | Freq: Two times a day (BID) | ORAL | Status: DC

## 2013-11-07 MED ORDER — LIDOCAINE HCL (CARDIAC) 20 MG/ML IV SOLN
INTRAVENOUS | Status: AC
  Filled 2013-11-07: qty 10

## 2013-11-07 MED ORDER — OXYCODONE HCL 5 MG PO TABS: 5.0000 mg | ORAL_TABLET | Freq: Once | ORAL | Status: DC | PRN

## 2013-11-07 MED ORDER — FENTANYL CITRATE 0.05 MG/ML IJ SOLN
INTRAMUSCULAR | Status: AC
  Filled 2013-11-07: qty 5

## 2013-11-07 MED ORDER — HEPARIN SODIUM (PORCINE) 1000 UNIT/ML IJ SOLN
INTRAMUSCULAR | Status: DC | PRN
  Administered 2013-11-07: 8000 [IU] via INTRAVENOUS

## 2013-11-07 MED ORDER — NEOSTIGMINE METHYLSULFATE 10 MG/10ML IV SOLN
INTRAVENOUS | Status: DC | PRN
  Administered 2013-11-07: 4 mg via INTRAVENOUS

## 2013-11-07 MED ORDER — SODIUM CHLORIDE 0.9 % IR SOLN
Status: DC | PRN
  Administered 2013-11-07: 500 mL

## 2013-11-07 MED ORDER — PANTOPRAZOLE SODIUM 40 MG PO TBEC
40.0000 mg | DELAYED_RELEASE_TABLET | Freq: Every day | ORAL | Status: DC
  Administered 2013-11-08 – 2013-11-12 (×5): 40 mg via ORAL
  Filled 2013-11-07 (×5): qty 1

## 2013-11-07 MED ORDER — LABETALOL HCL 5 MG/ML IV SOLN
10.0000 mg | INTRAVENOUS | Status: DC | PRN
  Filled 2013-11-07: qty 4

## 2013-11-07 MED ORDER — GLYCOPYRROLATE 0.2 MG/ML IJ SOLN
INTRAMUSCULAR | Status: DC | PRN
  Administered 2013-11-07: 0.6 mg via INTRAVENOUS

## 2013-11-07 MED ORDER — PHENOL 1.4 % MT LIQD: 1.0000 | OROMUCOSAL | Status: DC | PRN

## 2013-11-07 MED ORDER — GUAIFENESIN-DM 100-10 MG/5ML PO SYRP: 15.0000 mL | ORAL_SOLUTION | ORAL | Status: DC | PRN

## 2013-11-07 MED ORDER — OXYCODONE-ACETAMINOPHEN 5-325 MG PO TABS
ORAL_TABLET | ORAL | Status: AC
  Administered 2013-11-07: 2 via ORAL
  Filled 2013-11-07: qty 2

## 2013-11-07 MED ORDER — FENTANYL CITRATE 0.05 MG/ML IJ SOLN
INTRAMUSCULAR | Status: AC
  Administered 2013-11-07: 50 ug via INTRAVENOUS
  Filled 2013-11-07: qty 2

## 2013-11-07 MED ORDER — MIDAZOLAM HCL 2 MG/2ML IJ SOLN
0.5000 mg | Freq: Once | INTRAMUSCULAR | Status: AC | PRN
  Administered 2013-11-07: 0.5 mg via INTRAVENOUS

## 2013-11-07 MED ORDER — DEXTROSE 50 % IV SOLN
25.0000 mL | Freq: Once | INTRAVENOUS | Status: AC
  Administered 2013-11-07: 25 mL via INTRAVENOUS

## 2013-11-07 MED ORDER — SODIUM CHLORIDE 0.9 % IV SOLN
INTRAVENOUS | Status: DC
  Administered 2013-11-07: 1000 mL via INTRAVENOUS

## 2013-11-07 MED ORDER — POTASSIUM CHLORIDE CRYS ER 20 MEQ PO TBCR: 20.0000 meq | EXTENDED_RELEASE_TABLET | Freq: Every day | ORAL | Status: DC | PRN

## 2013-11-07 MED ORDER — MIDAZOLAM HCL 2 MG/2ML IJ SOLN
INTRAMUSCULAR | Status: AC
  Filled 2013-11-07: qty 2

## 2013-11-07 MED ORDER — ALBUMIN HUMAN 5 % IV SOLN
INTRAVENOUS | Status: DC | PRN
  Administered 2013-11-07: 14:00:00 via INTRAVENOUS

## 2013-11-07 MED ORDER — PROMETHAZINE HCL 25 MG/ML IJ SOLN: 6.2500 mg | INTRAMUSCULAR | Status: DC | PRN

## 2013-11-07 MED ORDER — OXYCODONE HCL 5 MG/5ML PO SOLN: 5.0000 mg | Freq: Once | ORAL | Status: DC | PRN

## 2013-11-07 MED ORDER — NEOSTIGMINE METHYLSULFATE 10 MG/10ML IV SOLN
INTRAVENOUS | Status: AC
  Filled 2013-11-07: qty 1

## 2013-11-07 MED ORDER — DOCUSATE SODIUM 100 MG PO CAPS
100.0000 mg | ORAL_CAPSULE | Freq: Every day | ORAL | Status: DC
  Administered 2013-11-08 – 2013-11-12 (×5): 100 mg via ORAL
  Filled 2013-11-07 (×4): qty 1

## 2013-11-07 MED ORDER — FENTANYL CITRATE 0.05 MG/ML IJ SOLN
INTRAMUSCULAR | Status: AC
  Filled 2013-11-07: qty 2

## 2013-11-07 MED ORDER — BISACODYL 10 MG RE SUPP: 10.0000 mg | Freq: Every day | RECTAL | Status: DC | PRN

## 2013-11-07 MED ORDER — ALUM & MAG HYDROXIDE-SIMETH 200-200-20 MG/5ML PO SUSP
15.0000 mL | ORAL | Status: DC | PRN
  Filled 2013-11-07: qty 30

## 2013-11-07 MED ORDER — INSULIN DETEMIR 100 UNIT/ML ~~LOC~~ SOLN
15.0000 [IU] | Freq: Every day | SUBCUTANEOUS | Status: DC
Start: 2013-11-07 — End: 2013-11-09
  Administered 2013-11-07 – 2013-11-08 (×2): 15 [IU] via SUBCUTANEOUS
  Filled 2013-11-07 (×3): qty 0.15

## 2013-11-07 MED ORDER — 0.9 % SODIUM CHLORIDE (POUR BTL) OPTIME
TOPICAL | Status: DC | PRN
  Administered 2013-11-07 (×2): 1000 mL

## 2013-11-07 MED ORDER — MIDAZOLAM HCL 5 MG/5ML IJ SOLN
INTRAMUSCULAR | Status: DC | PRN
  Administered 2013-11-07 (×2): 1 mg via INTRAVENOUS

## 2013-11-07 MED ORDER — ENOXAPARIN SODIUM 40 MG/0.4ML ~~LOC~~ SOLN
40.0000 mg | SUBCUTANEOUS | Status: DC
  Administered 2013-11-08 – 2013-11-12 (×5): 40 mg via SUBCUTANEOUS
  Filled 2013-11-07 (×7): qty 0.4

## 2013-11-07 MED ORDER — MEPERIDINE HCL 25 MG/ML IJ SOLN: 6.2500 mg | INTRAMUSCULAR | Status: DC | PRN

## 2013-11-07 MED ORDER — HYDRALAZINE HCL 20 MG/ML IJ SOLN: 5.0000 mg | INTRAMUSCULAR | Status: DC | PRN

## 2013-11-07 MED ORDER — DEXTROSE 5 % IV SOLN
10.0000 mg | INTRAVENOUS | Status: DC | PRN
Start: 2013-11-07 — End: 2013-11-07
  Administered 2013-11-07: 30 ug/min via INTRAVENOUS

## 2013-11-07 MED ORDER — FENTANYL CITRATE 0.05 MG/ML IJ SOLN
25.0000 ug | INTRAMUSCULAR | Status: DC | PRN
  Administered 2013-11-07 (×3): 50 ug via INTRAVENOUS

## 2013-11-07 MED ORDER — OXYCODONE-ACETAMINOPHEN 5-325 MG PO TABS
1.0000 | ORAL_TABLET | ORAL | Status: DC | PRN
Start: 2013-11-07 — End: 2013-11-08
  Administered 2013-11-07: 2 via ORAL

## 2013-11-07 MED ORDER — IOHEXOL 300 MG/ML  SOLN
INTRAMUSCULAR | Status: DC | PRN
  Administered 2013-11-07: 25 mL via INTRAVENOUS

## 2013-11-07 SURGICAL SUPPLY — 63 items
ADH SKN CLS APL DERMABOND .7 (GAUZE/BANDAGES/DRESSINGS) ×4
BANDAGE ELASTIC 4 VELCRO ST LF (GAUZE/BANDAGES/DRESSINGS) IMPLANT
BANDAGE ESMARK 6X9 LF (GAUZE/BANDAGES/DRESSINGS) IMPLANT
BNDG CMPR 9X6 STRL LF SNTH (GAUZE/BANDAGES/DRESSINGS)
BNDG ESMARK 6X9 LF (GAUZE/BANDAGES/DRESSINGS)
CANISTER SUCTION 2500CC (MISCELLANEOUS) ×4 IMPLANT
CANNULA VESSEL 3MM 2 BLNT TIP (CANNULA) ×4 IMPLANT
CANNULA VESSEL W/WING WO/VALVE (CANNULA) ×4 IMPLANT
CLIP TI MEDIUM 24 (CLIP) ×4 IMPLANT
CLIP TI WIDE RED SMALL 24 (CLIP) ×6 IMPLANT
CUFF TOURNIQUET SINGLE 24IN (TOURNIQUET CUFF) ×2 IMPLANT
CUFF TOURNIQUET SINGLE 34IN LL (TOURNIQUET CUFF) IMPLANT
CUFF TOURNIQUET SINGLE 44IN (TOURNIQUET CUFF) IMPLANT
DERMABOND ADVANCED (GAUZE/BANDAGES/DRESSINGS) ×4
DERMABOND ADVANCED .7 DNX12 (GAUZE/BANDAGES/DRESSINGS) IMPLANT
DRAIN CHANNEL 15F RND FF W/TCR (WOUND CARE) IMPLANT
DRAPE X-RAY CASS 24X20 (DRAPES) ×2 IMPLANT
DRSG COVADERM 4X10 (GAUZE/BANDAGES/DRESSINGS) IMPLANT
DRSG COVADERM 4X8 (GAUZE/BANDAGES/DRESSINGS) IMPLANT
ELECT CAUTERY BLADE 6.4 (BLADE) IMPLANT
ELECT REM PT RETURN 9FT ADLT (ELECTROSURGICAL) ×4
ELECTRODE REM PT RTRN 9FT ADLT (ELECTROSURGICAL) ×2 IMPLANT
EVACUATOR SILICONE 100CC (DRAIN) IMPLANT
GLOVE BIO SURGEON STRL SZ7.5 (GLOVE) ×4 IMPLANT
GLOVE BIOGEL PI IND STRL 6.5 (GLOVE) IMPLANT
GLOVE BIOGEL PI IND STRL 8 (GLOVE) ×2 IMPLANT
GLOVE BIOGEL PI INDICATOR 6.5 (GLOVE) ×2
GLOVE BIOGEL PI INDICATOR 8 (GLOVE) ×2
GLOVE ECLIPSE 6.5 STRL STRAW (GLOVE) ×2 IMPLANT
GLOVE SKINSENSE NS SZ7.0 (GLOVE) ×4
GLOVE SKINSENSE STRL SZ7.0 (GLOVE) IMPLANT
GOWN BRE IMP SLV AUR XL STRL (GOWN DISPOSABLE) ×2 IMPLANT
GOWN STRL REUS W/ TWL LRG LVL3 (GOWN DISPOSABLE) ×6 IMPLANT
GOWN STRL REUS W/TWL LRG LVL3 (GOWN DISPOSABLE) ×12
KIT BASIN OR (CUSTOM PROCEDURE TRAY) ×4 IMPLANT
KIT ROOM TURNOVER OR (KITS) ×4 IMPLANT
MARKER GRAFT CORONARY BYPASS (MISCELLANEOUS) IMPLANT
NS IRRIG 1000ML POUR BTL (IV SOLUTION) ×8 IMPLANT
PACK PERIPHERAL VASCULAR (CUSTOM PROCEDURE TRAY) ×4 IMPLANT
PAD ARMBOARD 7.5X6 YLW CONV (MISCELLANEOUS) ×8 IMPLANT
PADDING CAST COTTON 6X4 STRL (CAST SUPPLIES) IMPLANT
PENCIL BUTTON HOLSTER BLD 10FT (ELECTRODE) IMPLANT
PROBE PENCIL 8 MHZ STRL DISP (MISCELLANEOUS) ×2 IMPLANT
SET COLLECT BLD 21X3/4 12 (NEEDLE) ×2 IMPLANT
SPONGE SURGIFOAM ABS GEL 100 (HEMOSTASIS) IMPLANT
STAPLER VISISTAT (STAPLE) IMPLANT
STOPCOCK 4 WAY LG BORE MALE ST (IV SETS) ×2 IMPLANT
SUT ETHILON 3 0 PS 1 (SUTURE) IMPLANT
SUT PROLENE 5 0 C 1 24 (SUTURE) ×4 IMPLANT
SUT PROLENE 6 0 BV (SUTURE) ×8 IMPLANT
SUT PROLENE 7 0 BV 1 (SUTURE) IMPLANT
SUT SILK 2 0 FS (SUTURE) ×6 IMPLANT
SUT SILK 3 0 (SUTURE) ×4
SUT SILK 3-0 18XBRD TIE 12 (SUTURE) IMPLANT
SUT VIC AB 2-0 CTB1 (SUTURE) ×8 IMPLANT
SUT VIC AB 3-0 SH 27 (SUTURE) ×12
SUT VIC AB 3-0 SH 27X BRD (SUTURE) ×4 IMPLANT
SUT VICRYL 4-0 PS2 18IN ABS (SUTURE) ×10 IMPLANT
TOWEL OR 17X24 6PK STRL BLUE (TOWEL DISPOSABLE) ×8 IMPLANT
TRAY FOLEY CATH 16FRSI W/METER (SET/KITS/TRAYS/PACK) ×4 IMPLANT
TUBING EXTENTION W/L.L. (IV SETS) ×2 IMPLANT
UNDERPAD 30X30 INCONTINENT (UNDERPADS AND DIAPERS) ×4 IMPLANT
WATER STERILE IRR 1000ML POUR (IV SOLUTION) ×4 IMPLANT

## 2013-11-07 NOTE — Progress Notes (Signed)
[facility] PROGRESS NOTE  [name] ZOX:[id number] DOB: 22-Jun-1955 DOA: 11/03/2013 PCP: [name] Interim summary: [name] is a 58 y.o. male past medical history of hypertension hyperlipidemia PAD PVD ischemic cardiomyopathy, diabetes type 2 with a last hemoglobin A1c of 10, peripheral neuropathy that comes in for a blister that started 7 days prior to admission. It later on progressed to right foot ulcer. He was admitted to medical service for IV antibiotics and wound care consulted.   Assessment/Plan:  1. Diabetic right foot ulcer with cellulitis: admitted to telemetry for borderline bp. Started on IV antibiotics-vancomycin and Cipro. Wound care consultation appreciated. Blood cultures negative to date. Xray does not show osteomyelitis. ABI ordered and showed moderate arterial insufficiency on the right and mild to mod arterial insufficiency on the left. Vascular surgery input appreciated-please see below. 2. PAD/critical RLE ischemia: Vascular surgery consulted and patient underwent aortogram with bilateral iliac arteriogram and bilateral lower extremity runoff on 11/5. He has significant PAD and as per vascular surgery, without revascularization, he is at significant risk for limb loss. However at the same time patient is a high cardiac risk as per cardiology consultation. After careful consideration, patient underwent right fem pop bypass on 11/6. Mx per [facility]. 3. Ischemic cardiomyopathy/Chronic systolic CHF/CAD/AICD: Patient's antihypertensives and diuretics were held secondary to hypotension. As per cardiology, 2-D echo with LVEF 20-25 percent-stable from prior, nonischemic stress test in 7/14, ICD interrogation October 2015 showed no ventricular arrhythmias. Resumed diuretics at low dose. Cards resumed BB. Continue with aspirin, ACEI and statin. 4. Hypokalemia: replete as needed.  5. Diabetes Mellitus:   Reasonable inpatient control. Continue Levemir and SSI. Hypoglycemia  due to NPO- monitor and resume diet post op. Will reduce Levemir slightly. 6. Essential HTN: soft blood pressures. 7. History of hyperlipidemia: 8. History of tobacco abuse: Cessation counseled   Code Status: full code  Family Communication: d/w mother at bedside.  Disposition Plan: home when medically stable   Consultants:  Wound care consult.   Vascular surgery  Cardiology  Procedures:  ABI  Aortogram with bilateral iliac arteriogram and bilateral lower extremity runoff on 11/5  RLE Fem pop bypass 11/6  Antibiotics:  Vancomycin 11/2>  Ciprofloxacin 11/2>  HPI/Subjective: Patient seen before surgery this am- denied complaints. No CP.  Objective:  Filed Vitals:   11/07/13 1645 11/07/13 1700 11/07/13 1715 11/07/13 1730  BP:      Pulse: 84 80 73   Temp:      TempSrc:      Resp: 17 17 17 16   Height:      Weight:      SpO2: 100% 100% 96% 100%  T 98.4, RR 16   Intake/Output Summary (Last 24 hours) at 11/07/13 1742 Last data filed at 11/07/13 1645  Gross per 24 hour  Intake   2190 ml  Output   4595 ml  Net  -2405 ml   Filed Weights   11/04/13 1941 11/05/13 0352 11/07/13 0400  Weight: 84.3 kg (185 lb 13.6 oz) 84.5 kg (186 lb 4.6 oz) 90.674 kg (199 lb 14.4 oz)    Exam:   General:  Patient lying comfortably in bed.  Cardiovascular: s1s2, no m/r/g. No JVD or pedal edema. Telemetry: Sinus rhythm with BBB morphology. Less PVC's  Respiratory: chest clear to auscultation, no wheezing or rhonchi.   Gastroenterology: soft non tender non distended bowel sounds heard.  Musculoskeletal: right foot ulcer about 4 cm with some cellulitis changes over the dorsum of the  foot and leg and left foot / lateral malleolus 2 cm ulcer-dried and without acute findings. Absent bilateral dorsalis pedis and posterior tibial pulsations.  Data Reviewed: Basic Metabolic Panel:  Recent Labs Lab 11/03/13 1312 11/04/13 0640 11/06/13 0506 11/06/13 1545 11/07/13 1340  11/07/13 1515  NA 136* 139 136*  --   --   --   K 5.1 3.4* 4.0  --   --   --   CL 93* 101 100  --   --   --   CO2 29 26 24   --   --   --   GLUCOSE 408* 105* 85  --  130* 129*  BUN 13 11 16   --   --   --   CREATININE 0.81 0.57 0.83 0.68  --   --   CALCIUM 9.4 9.0 8.8  --   --   --    Liver Function Tests:  Recent Labs Lab 11/03/13 1729  AST 19  ALT 26  ALKPHOS 164*  BILITOT 0.4  PROT 8.3  ALBUMIN 3.2*   No results for input(s): LIPASE, AMYLASE in the last 168 hours. No results for input(s): AMMONIA in the last 168 hours. CBC:  Recent Labs Lab 11/03/13 1312 11/04/13 0640 11/06/13 1545  WBC 10.7* 7.3 6.5  NEUTROABS 8.7*  --   --   HGB 17.0 15.3 13.9  HCT 50.4 46.6 43.0  MCV 89.0 88.8 88.5  PLT 202 187 169   Cardiac Enzymes: No results for input(s): CKTOTAL, CKMB, CKMBINDEX, TROPONINI in the last 168 hours. BNP (last 3 results)  Recent Labs  12/02/12 2159 05/28/13 1020  PROBNP 2717.0* 1463.0*   CBG:  Recent Labs Lab 11/07/13 0735 11/07/13 1050 11/07/13 1119 11/07/13 1158 11/07/13 1644  GLUCAP 88 64* 88 153* 106*    Recent Results (from the past 240 hour(s))  Blood Cultures x 2 sites     Status: None (Preliminary result)   Collection Time: 11/03/13  6:19 PM  Result Value Ref Range Status   Specimen Description BLOOD FOREARM LEFT  Final   Special Requests BOTTLES DRAWN AEROBIC AND ANAEROBIC 5CC  Final   Culture  Setup Time   Final    11/04/2013 00:53 Performed at [facility]    Culture   Final           BLOOD CULTURE RECEIVED NO GROWTH TO DATE CULTURE WILL BE HELD FOR 5 DAYS BEFORE ISSUING A FINAL NEGATIVE REPORT Performed at [facility]    Report Status PENDING  Incomplete  Blood Cultures x 2 sites     Status: None (Preliminary result)   Collection Time: 11/03/13  6:25 PM  Result Value Ref Range Status   Specimen Description BLOOD FOREARM RIGHT  Final   Special Requests BOTTLES DRAWN AEROBIC AND ANAEROBIC 5CC  Final    Culture  Setup Time   Final    11/04/2013 00:53 Performed at [facility]    Culture   Final           BLOOD CULTURE RECEIVED NO GROWTH TO DATE CULTURE WILL BE HELD FOR 5 DAYS BEFORE ISSUING A FINAL NEGATIVE REPORT Performed at [facility]    Report Status PENDING  Incomplete  MRSA PCR Screening     Status: None   Collection Time: 11/04/13 10:15 PM  Result Value Ref Range Status   MRSA by PCR NEGATIVE NEGATIVE Final    Comment:        The GeneXpert MRSA Assay (  FDA approved for NASAL specimens only), is one component of a comprehensive MRSA colonization surveillance program. It is not intended to diagnose MRSA infection nor to guide or monitor treatment for MRSA infections.      Studies: Dg Ang/ext/uni/or Right  11/07/2013   CLINICAL DATA:  Intraoperative angiogram  EXAM: RIGHT ANG/EXT/UNI/ OR  COMPARISON:  None.  FINDINGS: There is of arterial bypass graft to the popliteal artery below-the-knee which is patent. The popliteal artery at the knee and below-the-knee is patent. Anterior tibial artery is patent with focal significant disease in the proximal third. Peroneal artery is patent to the ankle. Posterior tibial artery is patent to the ankle.  IMPRESSION: Arterial bypass graft is patent with 3 vessel runoff.   Electronically Signed   By: Maryclare BeanArt  Hoss M.D.   On: 11/07/2013 16:31    Scheduled Meds: . [MAR Hold] aspirin EC  81 mg Oral Daily  . [MAR Hold] atorvastatin  80 mg Oral QODAY  . [MAR Hold] benazepril  5 mg Oral Daily  . [MAR Hold] carvedilol  6.25 mg Oral BID WC  . [MAR Hold] ciprofloxacin  400 mg Intravenous Q12H  . [MAR Hold] collagenase   Topical Daily  . dextrose      . [MAR Hold] ezetimibe  10 mg Oral Daily  . fentaNYL      . [MAR Hold] heparin  5,000 Units Subcutaneous 3 times per day  . [MAR Hold] insulin aspart  0-15 Units Subcutaneous TID WC  . [MAR Hold] insulin aspart  0-5 Units Subcutaneous QHS  . [MAR Hold] insulin aspart  3 Units  Subcutaneous TID WC  . [MAR Hold] insulin detemir  20 Units Subcutaneous QHS  . midazolam      . [MAR Hold] sodium chloride  3 mL Intravenous Q12H  . [MAR Hold] torsemide  20 mg Oral Daily  . [MAR Hold] vancomycin  1,500 mg Intravenous Q12H   Continuous Infusions: . lactated ringers 50 mL/hr at 11/07/13 1050    Principal Problem:   Cellulitis of right foot Active Problems:   Coronary atherosclerosis   SYSTOLIC HEART FAILURE, CHRONIC   Peripheral vascular disease   Hypertension   Cellulitis of right lower extremity   PVD (peripheral vascular disease)   Pre-operative cardiovascular examination   PAD (peripheral artery disease)    Time spent: 35 minutes.    Marcellus ScottHONGALGI,Yasuo Phimmasone, [name], FACP, FHM. [facility] Pager [phone]  If 7PM-7AM, please contact night-coverage [email or web address] Password [email or web address] 11/07/2013, 5:42 PM    LOS: 4 days

## 2013-11-07 NOTE — Op Note (Signed)
NAME: [name]    MRN: [id number] DOB: 03-18-1955    DATE OF OPERATION: 11/07/2013  PREOP DIAGNOSIS: critical limb ischemia right lower extremity  POSTOP DIAGNOSIS: same  PROCEDURE:  1. Right common femoral artery to below-knee popliteal artery bypass with non-reversed saphenous vein graft 2. Intraoperative arteriogram  SURGEON: Di Kindlehristopher S. Edilia Boickson, MD, FACS  ASSIST: Karsten RoKim Trinh, PAC, Tilden FossaLanita Presson RNFA  ANESTHESIA: Gen.   EBL: 100 cc  INDICATIONS: Dalton RainwaterLarry D [name] is a 58 y.o. male with an extensive nonhealing wound of the right foot. He underwent an arteriogram which showed a superficial femoral artery occlusion with reconstitution of the popliteal artery. It was felt that his best chance for limb salvage was attempted revascularization and I recommended a right femoral to below-knee popliteal artery bypass.  FINDINGS: The below-knee popliteal artery was patent. Completion arteriogram showed 3 vessel runoff.  TECHNIQUE: The patient was brought to the operating room and received a general anesthetic. The right lower extremity was prepped and draped in the usual sterile fashion. A longitudinal incision was made in the right groin and the dissection carried down to the common femoral artery which was dissected free and controlled with a vessel loop. The superficial femoral artery and deep femoral arteries were also controlled. A posterior branch was controlled. Using 3 additional incisions along the medial aspect of the right leg greater saphenous vein was harvested from the saphenofemoral junction to the proximal calf. Branches were divided between clips and 3-0 silk ties. The vein was ligated distally and irrigated up nicely with heparinized saline. The saphenofemoral junction was clamped and the saphenous vein excised from the femoral vein. The femoral vein was oversewn with a 5-0 Prolene suture. Through the distal incision the below-knee popliteal artery was exposed. A tunnel was  created from this incision and groin incision the patient was then heparinized.  The proximal valve in the saphenous vein was excised sharply. The common femoral artery was clamped proximally. The superficial femoral artery and deep femoral arteries were controlled with Vesseloops. A longitudinal arteriotomy was made in the soft spot along the anterolateral aspect of the right common femoral artery. The vein was sewn in a non-reverse fashion and a side to the common femoral artery using continuous 6-0 Prolene suture. A radiopaque marker was placed around the anastomosis proximally. Prior to completing the anastomosis the artery was backbled and flushed and there was excellent inflow. Next the retrograde Damita LackMills valvulotome was used to lyse the valves. Excellent flow was established through the vein and the vein was then flushed with heparinized saline and clamped. It was then marked to prevent twisting. It was brought to the previously created tunnel for anastomosis to the below-knee popliteal artery.  A tourniquet was placed on the thigh. The leg was exsanguinated with an Esmarch bandage. The tourniquet was inflated to 300 mmHg. Under tourniquet control, a longitudinal arteriotomy was made in the below-knee popliteal artery. The vein was cut the appropriate length, spatulated, and sewn end-to-side to the below-knee popliteal artery using continuous 6-0 Prolene suture. Prior to completing the anastomosis the tourniquet was released. The arteries were backbled and flushed properly and the anastomosis completed. Flow was reestablished to the right leg. A completion arteriogram was obtained by cannulating the proximal vein. This showed no technical problems and three-vessel runoff. The heparin was partially reversed with protamine.  The groin incisions close the deep layer of 2-0 Vicryl after hemostasis was obtained. The subcutaneous layer was closed with 3-0 Vicryl. The skin  was closed with a 4-0 subcuticular  stitch. The 3 remaining incisions close the deeper thrill [place] skin closed with 4-0 Vicryl. Dermabond was applied. The patient tolerated the procedure well. There was a brisk posterior tibial signal and anterior tibial signal at the completion of the procedure. The patient was transferred to the recovery room in stable condition. All needle and sponge counts were correct.  Waverly Ferrarihristopher Dickson, MD, FACS [facility]  DATE OF DICTATION:   11/07/2013

## 2013-11-07 NOTE — Anesthesia Preprocedure Evaluation (Signed)
Anesthesia Evaluation  Patient identified by MRN, date of birth, ID band Patient awake    Reviewed: Allergy & Precautions, H&P , NPO status , Patient's Chart, lab work & pertinent test results, reviewed documented beta blocker date and time   History of Anesthesia Complications Negative for: history of anesthetic complications  Airway Mallampati: II  TM Distance: >3 FB Neck ROM: Full    Dental  (+) Loose, Dental Advisory Given, Poor Dentition, Missing, Chipped   Pulmonary neg pulmonary ROS, COPDCurrent Smoker,  breath sounds clear to auscultation        Cardiovascular hypertension, Pt. on medications and Pt. on home beta blockers + CAD and + Peripheral Vascular Disease + Cardiac Defibrillator (has never discharged) Rhythm:Regular Rate:Normal  '15 ECHO: ejection fraction was in the range of 20% to 25%. Akinesis and scarring of the basal-midinferior myocardium; consistent with infarction. Akinesis and scarring of the mid-apicalanteroseptal, anterior, anterolateral, and apical myocardium; consistent with infarction.  Myoview (7/14): EF 26%, large ant, septal, inf and apical infarct, no ischemia.    Neuro/Psych negative neurological ROS     GI/Hepatic negative GI ROS, Neg liver ROS,   Endo/Other  diabetes (glu 88), Insulin Dependent  Renal/GU negative Renal ROS     Musculoskeletal   Abdominal   Peds  Hematology   Anesthesia Other Findings   Reproductive/Obstetrics                             Anesthesia Physical Anesthesia Plan  ASA: III  Anesthesia Plan: General   Post-op Pain Management:    Induction:   Airway Management Planned: Oral ETT  Additional Equipment: Arterial line  Intra-op Plan:   Post-operative Plan: Extubation in OR  Informed Consent: I have reviewed the patients History and Physical, chart, labs and discussed the procedure including the risks, benefits and  alternatives for the proposed anesthesia with the patient or authorized representative who has indicated his/her understanding and acceptance.   Dental advisory given  Plan Discussed with: CRNA and Surgeon  Anesthesia Plan Comments: (Plan routine monitors, A line, GETA)        Anesthesia Quick Evaluation

## 2013-11-07 NOTE — Anesthesia Postprocedure Evaluation (Signed)
Anesthesia Post Note  Patient: [name]  Procedure(s) Performed: Procedure(s) (LRB): Right FEMORAL-POPLITEAL ARTERY Bypass  (Right) INTRA OPERATIVE ARTERIOGRAM (Right)  Anesthesia type: General  Patient location: PACU  Post pain: Pain level controlled and Adequate analgesia  Post assessment: Post-op Vital signs reviewed, Patient's Cardiovascular Status Stable, Respiratory Function Stable, Patent Airway and Pain level controlled  Last Vitals:  Filed Vitals:   11/07/13 1715  BP:   Pulse: 73  Temp:   Resp: 17    Post vital signs: Reviewed and stable  Level of consciousness: awake, alert  and oriented  Complications: No apparent anesthesia complications

## 2013-11-07 NOTE — Progress Notes (Signed)
Patient ID: Dalton RainwaterLarry D [name], male   DOB: 12/27/1955, 58 y.o.   MRN: [id number]    Subjective:  Denies SSCP, palpitations or Dyspnea   Objective:  Filed Vitals:   11/06/13 2000 11/07/13 0000 11/07/13 0400 11/07/13 0810  BP: 99/69 126/79 102/70 102/73  Pulse: 80 84 72 72  Temp: 98.6 F (37 C) 98.5 F (36.9 C) 97.8 F (36.6 C) 98 F (36.7 C)  TempSrc: Oral Oral Oral Oral  Resp: 20  20 20   Height:      Weight:   90.674 kg (199 lb 14.4 oz)   SpO2: 97% 98% 100% 100%    Intake/Output from previous day:  Intake/Output Summary (Last 24 hours) at 11/07/13 0856 Last data filed at 11/07/13 [id number]  Gross per 24 hour  Intake    600 ml  Output   4780 ml  Net  -4180 ml    Physical Exam: Affect appropriate Chronically ill male  HEENT: normal Neck supple with no adenopathy JVP normal no bruits no thyromegaly Lungs clear with no wheezing and good diaphragmatic motion Heart:  S1/S2 MR murmur, no rub, gallop or click PMI enlarged  AICD under left clavicle  Abdomen: benighn, BS positve, no tenderness, no AAA no bruit.  No HSM or HJR PVD with cellulitis and poor blood flow to RLE    Lab Results: Basic Metabolic Panel:  Recent Labs  56/21/3009/05/16 0506 11/06/13 1545  NA 136*  --   K 4.0  --   CL 100  --   CO2 24  --   GLUCOSE 85  --   BUN 16  --   CREATININE 0.83 0.68  CALCIUM 8.8  --    CBC:  Recent Labs  11/06/13 1545  WBC 6.5  HGB 13.9  HCT 43.0  MCV 88.5  PLT 169    Imaging: No results found.  Cardiac Studies:  ECG: ST rate 101  Old IMI/AMI     Telemetry:  NSR rates 70  Echo: EF 20-25%  Moderate MR   Medications:   . aspirin EC  81 mg Oral Daily  . atorvastatin  80 mg Oral QODAY  . benazepril  5 mg Oral Daily  .  ceFAZolin (ANCEF) IV  1 g Intravenous On Call  . ciprofloxacin  400 mg Intravenous Q12H  . collagenase   Topical Daily  . ezetimibe  10 mg Oral Daily  . heparin  5,000 Units Subcutaneous 3 times per day  . insulin aspart  0-15 Units  Subcutaneous TID WC  . insulin aspart  0-5 Units Subcutaneous QHS  . insulin aspart  3 Units Subcutaneous TID WC  . insulin detemir  20 Units Subcutaneous QHS  . sodium chloride  3 mL Intravenous Q12H  . torsemide  20 mg Oral Daily  . vancomycin  1,500 mg Intravenous Q12H       Assessment/Plan:  Cardiac:  Ischemic DCM no chest pain compensated  Needs vascular surgery today right fempop for limb salvage  Not on home  beta blocker BP soft  Add coreg back Euvolemic with good diuresis  ? Need to place magnet on AICD during procedure.  Will follow post op  [name] [name] 11/07/2013, 8:56 AM

## 2013-11-07 NOTE — H&P (View-Only) (Signed)
VASCULAR SURGERY  This patient has limb threatening ischemia of the right lower extremity with a nonhealing wound of the right great toe which is fairly extensive. He has been evaluated by cardiology and felt to be high risk for surgery. However no further cardiac workup has been recommended. I discussed this with the patient and he is willing to accept the risk given that without revascularization he will likely require amputation. I have recommended a right femoropopliteal bypass graft tomorrow. We will use vein if it is usable otherwise we would do the bypass with a prosthetic graft. I have reviewed the indications for lower extremity bypass. I have also reviewed the potential complications of surgery including but not limited to: wound healing problems, infection, graft thrombosis, limb loss, or other unpredictable medical problems. All the patient's questions were answered and they are agreeable to proceed.  [name], MD, FACS Beeper [phone] 11/06/2013

## 2013-11-07 NOTE — Progress Notes (Signed)
CBG meter just documented reading of 64. Pt just went  To OR. I called and spoke with Aram BeechamCynthia to inform her of reading . She stated she would relay information to the nurse

## 2013-11-07 NOTE — Anesthesia Procedure Notes (Signed)
Procedure Name: Intubation Date/Time: 11/07/2013 12:24 PM Performed by: [place] Pink Pre-anesthesia Checklist: Patient identified, Emergency Drugs available, Suction available, Patient being monitored and Timeout performed Patient Re-evaluated:Patient Re-evaluated prior to inductionOxygen Delivery Method: Circle system utilized Preoxygenation: Pre-oxygenation with 100% oxygen Intubation Type: IV induction Ventilation: Mask ventilation without difficulty Laryngoscope Size: Mac and 4 Grade View: Grade II Tube type: Oral Tube size: 7.5 mm Number of attempts: 1 Airway Equipment and Method: Stylet and LTA kit utilized Placement Confirmation: ETT inserted through vocal cords under direct vision,  positive ETCO2 and breath sounds checked- equal and bilateral Secured at: 22 cm Tube secured with: Tape Dental Injury: Teeth and Oropharynx as per pre-operative assessment

## 2013-11-07 NOTE — Progress Notes (Signed)
VASCULAR LAB PRELIMINARY  PRELIMINARY  PRELIMINARY  PRELIMINARY  Right Lower Extremity Vein Map    Right Great Saphenous Vein   Segment Diameter Comment  1. Origin 6.12 mm   2. High Thigh 2.92 mm   3. Mid Thigh 3.24 mm  Large varicose vein between 3 and4   4. Low Thigh 3.1 mm   5. At Knee 2.51 mm  branch between 5 and 6  6. High Calf 3.24 mm  follow antero medial branch.    7. Low Calf 2.6 mm   8. Ankle 3.43 mm    mm  Interstitial fluid in the calf      mm    mm        Cassandra Mcmanaman, RVT 11/07/2013, 9:50 AM

## 2013-11-07 NOTE — Interval H&P Note (Signed)
History and Physical Interval Note:  11/07/2013 11:44 AM  [name]  has presented today for surgery, with the diagnosis of PVD and ulcer  The various methods of treatment have been discussed with the patient and family. After consideration of risks, benefits and other options for treatment, the patient has consented to  Procedure(s): BYPASS GRAFT FEMORAL-POPLITEAL ARTERY (Right) as a surgical intervention .  The patient's history has been reviewed, patient examined, no change in status, stable for surgery.  I have reviewed the patient's chart and labs.  Questions were answered to the patient's satisfaction.     Hasten Sweitzer S

## 2013-11-07 NOTE — Transfer of Care (Signed)
Immediate Anesthesia Transfer of Care Note  Patient: [name]  Procedure(s) Performed: Procedure(s): Right FEMORAL-POPLITEAL ARTERY Bypass  (Right) INTRA OPERATIVE ARTERIOGRAM (Right)  Patient Location: PACU  Anesthesia Type:General  Level of Consciousness: awake, alert  and oriented  Airway & Oxygen Therapy: Patient connected to face mask oxygen  Post-op Assessment: Report given to PACU RN  Post vital signs: stable  Complications: No apparent anesthesia complications

## 2013-11-08 LAB — CBC
HCT: 42.5 % (ref 39.0–52.0)
Hemoglobin: 14.2 g/dL (ref 13.0–17.0)
MCH: 29.2 pg (ref 26.0–34.0)
MCHC: 33.4 g/dL (ref 30.0–36.0)
MCV: 87.3 fL (ref 78.0–100.0)
PLATELETS: 153 10*3/uL (ref 150–400)
RBC: 4.87 MIL/uL (ref 4.22–5.81)
RDW: 14.4 % (ref 11.5–15.5)
WBC: 7.5 10*3/uL (ref 4.0–10.5)

## 2013-11-08 LAB — BASIC METABOLIC PANEL
ANION GAP: 15 (ref 5–15)
BUN: 11 mg/dL (ref 6–23)
CHLORIDE: 100 meq/L (ref 96–112)
CO2: 19 meq/L (ref 19–32)
CREATININE: 0.56 mg/dL (ref 0.50–1.35)
Calcium: 8.4 mg/dL (ref 8.4–10.5)
GFR calc non Af Amer: >90 mL/min (ref >90)
Glucose, Bld: 76 mg/dL (ref 70–99)
POTASSIUM: 4.3 meq/L (ref 3.7–5.3)
SODIUM: 134 meq/L — AB (ref 137–147)

## 2013-11-08 LAB — GLUCOSE, CAPILLARY
GLUCOSE-CAPILLARY: 204 mg/dL — AB (ref 70–99)
Glucose-Capillary: 140 mg/dL — ABNORMAL HIGH (ref 70–99)
Glucose-Capillary: 217 mg/dL — ABNORMAL HIGH (ref 70–99)
Glucose-Capillary: 212 mg/dL — ABNORMAL HIGH (ref 70–99)

## 2013-11-08 MED ORDER — SULFAMETHOXAZOLE-TRIMETHOPRIM 800-160 MG PO TABS
1.0000 | ORAL_TABLET | Freq: Two times a day (BID) | ORAL | Status: DC
  Administered 2013-11-08 – 2013-11-12 (×9): 1 via ORAL
  Filled 2013-11-08 (×11): qty 1

## 2013-11-08 NOTE — Progress Notes (Addendum)
[facility] PROGRESS NOTE  [name] ZOX:[id number] DOB: 12/16/1955 DOA: 11/03/2013 PCP: [name], MD Interim summary: [name] is a 58 y.o. male past medical history of hypertension hyperlipidemia PAD PVD ischemic cardiomyopathy, diabetes type 2 with a last hemoglobin A1c of 10, peripheral neuropathy that comes in for a blister that started 7 days prior to admission. It later on progressed to right foot ulcer. He was admitted to medical service for IV antibiotics and wound care consulted. S/P right fem pop bypass 11/6  Assessment/Plan:  1. Diabetic right foot ulcer with cellulitis: Started on IV antibiotics-vancomycin and Cipro. Wound care consultation appreciated. Blood cultures negative to date. Xray does not show osteomyelitis. Now post op RLE fem pop bypass. Improving. Will deescalate Abx to PO Bactrim. 2. PAD/critical RLE ischemia: After careful consideration & cardiology in put, patient underwent right fem pop bypass on 11/6. Mx per [facility].  3. Ischemic cardiomyopathy/Chronic systolic CHF/CAD/AICD: Patient's antihypertensives and diuretics were held secondary to hypotension. As per cardiology, 2-D echo with LVEF 20-25 percent-stable from prior, nonischemic stress test in 7/14, ICD interrogation October 2015 showed no ventricular arrhythmias. Resumed diuretics at low dose. Cards resumed BB. Continue with aspirin, ACEI and statin. Stable 4. Hypokalemia: repleted.  5. Diabetes Mellitus:  Reasonable inpatient control. Continue Levemir and SSI. Reduced Levemir slightly on 11/6 due to hypoglycemia. Reasonable control. 6. Essential HTN: controlled. 7. History of hyperlipidemia: 8. History of tobacco abuse: Cessation counseled   Code Status: full code  Family Communication: d/w mother at bedside on 11/6.  Disposition Plan: home when medically stable. Transferred back to tele by [facility]   Consultants:  Wound care consult.   Vascular  surgery  Cardiology  Procedures:  ABI  Aortogram with bilateral iliac arteriogram and bilateral lower extremity runoff on 11/5  RLE Fem pop bypass 11/6  Antibiotics:  Vancomycin 11/2>11/7  Ciprofloxacin 11/2>11/7  PO Bactrim 11/7>  HPI/Subjective: Mild appropriate soreness of RLE. Seen this AM in SDU.  Objective:  Filed Vitals:   11/08/13 0407 11/08/13 0754 11/08/13 1128 11/08/13 1426  BP: 105/68 113/68 106/70 110/65  Pulse: 83 90 92 91  Temp: 98.6 F (37 C) 98.2 F (36.8 C) 98.6 F (37 C) 98.4 F (36.9 C)  TempSrc: Oral Oral Oral Oral  Resp: 11 14 18 18   Height:      Weight: 93.8 kg (206 lb 12.7 oz)     SpO2: 99% 98% 98% 97%     Intake/Output Summary (Last 24 hours) at 11/08/13 1427 Last data filed at 11/08/13 1300  Gross per 24 hour  Intake   7380 ml  Output   1120 ml  Net   6260 ml   Filed Weights   11/07/13 0400 11/07/13 2000 11/08/13 0407  Weight: 90.674 kg (199 lb 14.4 oz) 93.8 kg (206 lb 12.7 oz) 93.8 kg (206 lb 12.7 oz)    Exam:   General:  Patient sitting up comfortably on chair.  Cardiovascular: s1s2, no m/r/g. No JVD or pedal edema. Telemetry: Sinus rhythm with BBB morphology & occasional PVC's  Respiratory: chest clear to auscultation, no wheezing or rhonchi.   Gastroenterology: soft non tender non distended bowel sounds heard.  Musculoskeletal: right foot dressing clear and dry. Surgical sites intact and clean.   Data Reviewed: Basic Metabolic Panel:  Recent Labs Lab 11/03/13 1312 11/04/13 0640 11/06/13 0506 11/06/13 1545 11/07/13 1340 11/07/13 1515 11/08/13 0530  NA 136* 139 136*  --   --   --  134*  K  5.1 3.4* 4.0  --   --   --  4.3  CL 93* 101 100  --   --   --  100  CO2 29 26 24   --   --   --  19  GLUCOSE 408* 105* 85  --  130* 129* 76  BUN 13 11 16   --   --   --  11  CREATININE 0.81 0.57 0.83 0.68  --   --  0.56  CALCIUM 9.4 9.0 8.8  --   --   --  8.4   Liver Function Tests:  Recent Labs Lab 11/03/13 1729   AST 19  ALT 26  ALKPHOS 164*  BILITOT 0.4  PROT 8.3  ALBUMIN 3.2*   No results for input(s): LIPASE, AMYLASE in the last 168 hours. No results for input(s): AMMONIA in the last 168 hours. CBC:  Recent Labs Lab 11/03/13 1312 11/04/13 0640 11/06/13 1545 11/08/13 0530  WBC 10.7* 7.3 6.5 7.5  NEUTROABS 8.7*  --   --   --   HGB 17.0 15.3 13.9 14.2  HCT 50.4 46.6 43.0 42.5  MCV 89.0 88.8 88.5 87.3  PLT 202 187 169 153   Cardiac Enzymes: No results for input(s): CKTOTAL, CKMB, CKMBINDEX, TROPONINI in the last 168 hours. BNP (last 3 results)  Recent Labs  12/02/12 2159 05/28/13 1020  PROBNP 2717.0* 1463.0*   CBG:  Recent Labs Lab 11/07/13 1158 11/07/13 1644 11/07/13 2119 11/08/13 0752 11/08/13 1121  GLUCAP 153* 106* 103* 140* 217*    Recent Results (from the past 240 hour(s))  Blood Cultures x 2 sites     Status: None (Preliminary result)   Collection Time: 11/03/13  6:19 PM  Result Value Ref Range Status   Specimen Description BLOOD FOREARM LEFT  Final   Special Requests BOTTLES DRAWN AEROBIC AND ANAEROBIC 5CC  Final   Culture  Setup Time   Final    11/04/2013 00:53 [facility]formed at [facility]    Culture   Final           BLOOD CULTURE RECEIVED NO GROWTH TO DATE CULTURE WILL BE HELD FOR 5 DAYS BEFORE ISSUING A FINAL NEGATIVE REPORT Performed at [facility]    Report Status PENDING  Incomplete  Blood Cultures x 2 sites     Status: None (Preliminary result)   Collection Time: 11/03/13  6:25 PM  Result Value Ref Range Status   Specimen Description BLOOD FOREARM RIGHT  Final   Special Requests BOTTLES DRAWN AEROBIC AND ANAEROBIC 5CC  Final   Culture  Setup Time   Final    11/04/2013 00:53 Performed at [facility]    Culture   Final           BLOOD CULTURE RECEIVED NO GROWTH TO DATE CULTURE WILL BE HELD FOR 5 DAYS BEFORE ISSUING A FINAL NEGATIVE REPORT Performed at [facility]    Report Status PENDING  Incomplete  MRSA  PCR Screening     Status: None   Collection Time: 11/04/13 10:15 PM  Result Value Ref Range Status   MRSA by PCR NEGATIVE NEGATIVE Final    Comment:        The GeneXpert MRSA Assay (FDA approved for NASAL specimens only), is one component of a comprehensive MRSA colonization surveillance program. It is not intended to diagnose MRSA infection nor to guide or monitor treatment for MRSA infections.      Studies: Dg Ang/ext/uni/or Right  11/07/2013  CLINICAL DATA:  Intraoperative angiogram  EXAM: RIGHT ANG/EXT/UNI/ OR  COMPARISON:  None.  FINDINGS: There is of arterial bypass graft to the popliteal artery below-the-knee which is patent. The popliteal artery at the knee and below-the-knee is patent. Anterior tibial artery is patent with focal significant disease in the proximal third. Peroneal artery is patent to the ankle. Posterior tibial artery is patent to the ankle.  IMPRESSION: Arterial bypass graft is patent with 3 vessel runoff.   Electronically Signed   By: Maryclare BeanArt  Hoss M.D.   On: 11/07/2013 16:31    Scheduled Meds: . aspirin EC  81 mg Oral Daily  . atorvastatin  80 mg Oral QODAY  . benazepril  5 mg Oral Daily  . carvedilol  6.25 mg Oral BID WC  . ciprofloxacin  400 mg Intravenous Q12H  . collagenase   Topical Daily  . docusate sodium  100 mg Oral Daily  . enoxaparin (LOVENOX) injection  40 mg Subcutaneous Q24H  . ezetimibe  10 mg Oral Daily  . insulin aspart  0-15 Units Subcutaneous TID WC  . insulin detemir  15 Units Subcutaneous QHS  . pantoprazole  40 mg Oral Daily  . sodium chloride  3 mL Intravenous Q12H  . torsemide  20 mg Oral Daily  . vancomycin  1,500 mg Intravenous Q12H   Continuous Infusions: . sodium chloride 1,000 mL (11/07/13 2315)    Principal Problem:   Cellulitis of right foot Active Problems:   Coronary atherosclerosis   SYSTOLIC HEART FAILURE, CHRONIC   Peripheral vascular disease   Hypertension   Cellulitis of right lower extremity   PVD  (peripheral vascular disease)   Pre-operative cardiovascular examination   PAD (peripheral artery disease)    Time spent: 35 minutes.    Marcellus ScottHONGALGI,Sheliah Fiorillo, MD, FACP, FHM. [facility] Pager [phone]  If 7PM-7AM, please contact night-coverage [email or web address] Password [email or web address] 11/08/2013, 2:27 PM    LOS: 5 days

## 2013-11-08 NOTE — Progress Notes (Signed)
Subjective:  Planes of pain in his foot with movement. No complaints of shortness of breath or chest pain.  Objective:  Vital Signs in the last 24 hours: BP 113/68 mmHg  Pulse 83  Temp(Src) 98.2 F (36.8 C) (Oral)  Resp 14  Ht 6' (1.829 m)  Wt 93.8 kg (206 lb 12.7 oz)  BMI 28.04 kg/m2  SpO2 99%  Physical Exam: Pleasant male in no acute distress appears much older than stated age Lungs:  Clear  Cardiac:  Regular rhythm, normal S1 and S2, no S3, 3/6 holosystolic murmur at apex Extremities: foot bandaged, trace edema  Intake/Output from previous day: 11/06 0701 - 11/07 0700 In: 3150 [I.V.:2900; IV Piggyback:250] Out: 845 [Urine:845] Weight Filed Weights   11/07/13 0400 11/07/13 2000 11/08/13 0407  Weight: 90.674 kg (199 lb 14.4 oz) 93.8 kg (206 lb 12.7 oz) 93.8 kg (206 lb 12.7 oz)    Lab Results: Basic Metabolic Panel:  Recent Labs  09/81/1909/05/16 0506 11/06/13 1545  11/07/13 1515 11/08/13 0530  NA 136*  --   --   --  134*  K 4.0  --   --   --  4.3  CL 100  --   --   --  100  CO2 24  --   --   --  19  GLUCOSE 85  --   < > 129* 76  BUN 16  --   --   --  11  CREATININE 0.83 0.68  --   --  0.56  < > = values in this interval not displayed.  CBC:  Recent Labs  11/06/13 1545 11/08/13 0530  WBC 6.5 7.5  HGB 13.9 14.2  HCT 43.0 42.5  MCV 88.5 87.3  PLT 169 153    BNP    Component Value Date/Time   PROBNP 1463.0* 05/28/2013 1020   Telemetry: Currently sinus rhythm  Assessment/Plan:  1. Ischemic cardiomyopathy currently stable 2. Recent vascular surgery tolerated operation well  Recommendations:  Restart prior medications. He is clinically doing fine right now.     Darden PalmerW. Spencer Nancyann Cotterman, Jr.  MD [facility] Cardiology  11/08/2013, 10:27 AM

## 2013-11-08 NOTE — Progress Notes (Signed)
Pt transfer to 2w13 per MD order, report called to receiving nurse and all questions answered.

## 2013-11-08 NOTE — Progress Notes (Signed)
Subjective: Interval History: none..sitting up in chair. Comfortable   Objective: Vital signs in last 24 hours: Temp:  [97.3 F (36.3 C)-98.6 F (37 C)] 98.2 F (36.8 C) (11/07 0754) Pulse Rate:  [64-84] 83 (11/07 0407) Resp:  [0-20] 14 (11/07 0754) BP: (101-118)/(65-77) 113/68 mmHg (11/07 0754) SpO2:  [96 %-100 %] 99 % (11/07 0407) Arterial Line BP: (93-127)/(51-64) 127/63 mmHg (11/07 0407) Weight:  [206 lb 12.7 oz (93.8 kg)] 206 lb 12.7 oz (93.8 kg) (11/07 0407)  Intake/Output from previous day: 11/06 0701 - 11/07 0700 In: 3150 [I.V.:2900; IV Piggyback:250] Out: 845 [Urine:845] Intake/Output this shift:    wounds all healing without hematoma present. Foot well-perfused. Biphasic anterior tibial signal  Lab Results:  Recent Labs  11/06/13 1545 11/08/13 0530  WBC 6.5 7.5  HGB 13.9 14.2  HCT 43.0 42.5  PLT 169 153   BMET  Recent Labs  11/06/13 0506 11/06/13 1545  11/07/13 1515 11/08/13 0530  NA 136*  --   --   --  134*  K 4.0  --   --   --  4.3  CL 100  --   --   --  100  CO2 24  --   --   --  19  GLUCOSE 85  --   < > 129* 76  BUN 16  --   --   --  11  CREATININE 0.83 0.68  --   --  0.56  CALCIUM 8.8  --   --   --  8.4  < > = values in this interval not displayed.  Studies/Results: Dg Ang/ext/uni/or Right  11/07/2013   CLINICAL DATA:  Intraoperative angiogram  EXAM: RIGHT ANG/EXT/UNI/ OR  COMPARISON:  None.  FINDINGS: There is of arterial bypass graft to the popliteal artery below-the-knee which is patent. The popliteal artery at the knee and below-the-knee is patent. Anterior tibial artery is patent with focal significant disease in the proximal third. Peroneal artery is patent to the ankle. Posterior tibial artery is patent to the ankle.  IMPRESSION: Arterial bypass graft is patent with 3 vessel runoff.   Electronically Signed   By: Maryclare BeanArt  Hoss M.D.   On: 11/07/2013 16:31   Dg Foot Complete Right  11/03/2013   CLINICAL DATA:  58 year old male with foot ulcer  at the dorsum of the medial right foot. Progressing. Swelling. Initial encounter.  EXAM: RIGHT FOOT COMPLETE - 3+ VIEW  COMPARISON:  None.  FINDINGS: Calcified atherosclerosis tracking into the right foot and continuing distally. Calcaneus intact. Bone mineralization is within normal limits. Degenerative changes at the first and third MTP joints. No acute fracture or dislocation identified. No osteolysis identified. No subcutaneous gas. No radiopaque foreign body identified.  IMPRESSION: 1. No acute osseous abnormality or plain radiographic evidence of osteomyelitis in the right foot. 2. Calcified peripheral vascular disease.   Electronically Signed   By: Augusto GambleLee  Hall M.D.   On: 11/03/2013 17:13   Anti-infectives: Anti-infectives    Start     Dose/Rate Route Frequency Ordered Stop   11/07/13 0600  [MAR Hold]  ceFAZolin (ANCEF) IVPB 1 g/50 mL premix     (MAR Hold since 11/07/13 1039)  Comments:  Send with pt to OR   1 g100 mL/hr over 30 Minutes Intravenous On call 11/06/13 2259 11/07/13 1240   11/03/13 1800  vancomycin (VANCOCIN) 1,500 mg in sodium chloride 0.9 % 500 mL IVPB     1,500 mg250 mL/hr over 120 Minutes Intravenous Every 12 hours 11/03/13  1744     11/03/13 1800  ciprofloxacin (CIPRO) IVPB 400 mg     400 mg200 mL/hr over 60 Minutes Intravenous Every 12 hours 11/03/13 1744        Assessment/Plan: s/p Procedure(s): Right FEMORAL-POPLITEAL ARTERY Bypass  (Right) INTRA OPERATIVE ARTERIOGRAM (Right) stable. Foley is been discontinued. (Mobilize. Transfer to 2 W.   LOS: 5 days   [name] 11/08/2013, 9:09 AM

## 2013-11-08 NOTE — Evaluation (Signed)
Physical Therapy Evaluation Patient Details Name: Dalton RainwaterLarry D Ibanez MRN: [id number] DOB: 01-04-1955 Today's Date: 11/08/2013   History of Present Illness  pt presents with R Fem Pop.    Clinical Impression  Pt painful, but agreeable to mobility.  Pt indicates his mother is only able to provide minimal A at D/C, so pt will need to be at ModI level to return to home.  Will continue to follow.      Follow Up Recommendations Home health PT;Supervision - Intermittent    Equipment Recommendations  Rolling walker with 5" wheels    Recommendations for Other Services       Precautions / Restrictions Precautions Precautions: None Restrictions Weight Bearing Restrictions: No      Mobility  Bed Mobility                  Transfers Overall transfer level: Needs assistance Equipment used: Pushed w/c Transfers: Sit to/from Stand Sit to Stand: Min assist         General transfer comment: cues for UE use and A with coming to stand.    Ambulation/Gait Ambulation/Gait assistance: Min guard Ambulation Distance (Feet): 150 Feet Assistive device:  (pushed W/C) Gait Pattern/deviations: Step-to pattern;Decreased step length - left;Decreased stance time - right;Decreased stride length     General Gait Details: pt moves slowly and needs encouragement to attempt to weightbear on R LE.    Stairs            Wheelchair Mobility    Modified Rankin (Stroke Patients Only)       Balance Overall balance assessment: Needs assistance         Standing balance support: Single extremity supported;During functional activity Standing balance-Leahy Scale: Poor                               Pertinent Vitals/Pain Pain Assessment: 0-10 Pain Score: 7  Pain Location: R LE Pain Descriptors / Indicators: Burning Pain Intervention(s): Monitored during session;Premedicated before session;Repositioned    Home Living Family/patient expects to be discharged to:: Private  residence Living Arrangements: Parent Available Help at Discharge: Family;Available PRN/intermittently Type of Home: House Home Access: Stairs to enter Entrance Stairs-Rails: Right Entrance Stairs-Number of Steps: "couple" Home Layout: One level Home Equipment: None      Prior Function Level of Independence: Independent               Hand Dominance        Extremity/Trunk Assessment   Upper Extremity Assessment: Defer to OT evaluation           Lower Extremity Assessment: RLE deficits/detail RLE Deficits / Details: ROM Limited by post-op pain.      Cervical / Trunk Assessment: Normal  Communication   Communication: No difficulties  Cognition Arousal/Alertness: Awake/alert Behavior During Therapy: WFL for tasks assessed/performed Overall Cognitive Status: Within Functional Limits for tasks assessed                      General Comments      Exercises        Assessment/Plan    PT Assessment Patient needs continued PT services  PT Diagnosis Difficulty walking;Acute pain   PT Problem List Decreased strength;Decreased range of motion;Decreased activity tolerance;Decreased balance;Decreased mobility;Decreased knowledge of use of DME;Pain  PT Treatment Interventions DME instruction;Gait training;Stair training;Functional mobility training;Therapeutic activities;Therapeutic exercise;Balance training;Patient/family education   PT Goals (Current goals can be found in  the Care Plan section) Acute Rehab PT Goals Patient Stated Goal: Leg to work normally again.   PT Goal Formulation: With patient Time For Goal Achievement: 11/15/13 Potential to Achieve Goals: Good    Frequency Min 3X/week   Barriers to discharge Decreased caregiver support      Co-evaluation               End of Session Equipment Utilized During Treatment: Gait belt Activity Tolerance: Patient limited by pain Patient left:  (in W/C to transfer to new unit) Nurse  Communication: Mobility status         Time: [id number] PT Time Calculation (min): 13 min   Charges:   PT Evaluation $Initial PT Evaluation Tier I: 1 Procedure PT Treatments $Gait Training: 8-22 mins   PT G CodesSunny Schlein:          Nayana Lenig F, [place]PT [phone] 11/08/2013, 12:15 PM

## 2013-11-09 DIAGNOSIS — E1159 Type 2 diabetes mellitus with other circulatory complications: Secondary | ICD-10-CM

## 2013-11-09 LAB — GLUCOSE, CAPILLARY
GLUCOSE-CAPILLARY: 259 mg/dL — AB (ref 70–99)
Glucose-Capillary: 146 mg/dL — ABNORMAL HIGH (ref 70–99)
Glucose-Capillary: 233 mg/dL — ABNORMAL HIGH (ref 70–99)
Glucose-Capillary: 210 mg/dL — ABNORMAL HIGH (ref 70–99)

## 2013-11-09 MED ORDER — INSULIN DETEMIR 100 UNIT/ML ~~LOC~~ SOLN
18.0000 [IU] | Freq: Every day | SUBCUTANEOUS | Status: DC
  Administered 2013-11-09 – 2013-11-11 (×3): 18 [IU] via SUBCUTANEOUS
  Filled 2013-11-09 (×4): qty 0.18

## 2013-11-09 MED ORDER — INSULIN ASPART 100 UNIT/ML ~~LOC~~ SOLN
0.0000 [IU] | Freq: Every day | SUBCUTANEOUS | Status: DC
Start: 2013-11-09 — End: 2013-11-12
  Administered 2013-11-09 – 2013-11-10 (×2): 2 [IU] via SUBCUTANEOUS

## 2013-11-09 MED ORDER — TORSEMIDE 20 MG PO TABS
60.0000 mg | ORAL_TABLET | Freq: Every day | ORAL | Status: DC
  Administered 2013-11-09 – 2013-11-12 (×4): 60 mg via ORAL
  Filled 2013-11-09 (×4): qty 3

## 2013-11-09 MED ORDER — INSULIN ASPART 100 UNIT/ML ~~LOC~~ SOLN
3.0000 [IU] | Freq: Three times a day (TID) | SUBCUTANEOUS | Status: DC
  Administered 2013-11-09 – 2013-11-12 (×11): 3 [IU] via SUBCUTANEOUS

## 2013-11-09 MED ORDER — TORSEMIDE 20 MG PO TABS
20.0000 mg | ORAL_TABLET | Freq: Every day | ORAL | Status: DC
  Filled 2013-11-09: qty 1

## 2013-11-09 MED ORDER — TORSEMIDE 20 MG PO TABS
20.0000 mg | ORAL_TABLET | Freq: Three times a day (TID) | ORAL | Status: DC
  Filled 2013-11-09 (×4): qty 1

## 2013-11-09 MED ORDER — INSULIN ASPART 100 UNIT/ML ~~LOC~~ SOLN
0.0000 [IU] | Freq: Three times a day (TID) | SUBCUTANEOUS | Status: DC
  Administered 2013-11-09: 5 [IU] via SUBCUTANEOUS
  Administered 2013-11-09: 8 [IU] via SUBCUTANEOUS
  Administered 2013-11-10 (×2): 3 [IU] via SUBCUTANEOUS
  Administered 2013-11-11 (×2): 5 [IU] via SUBCUTANEOUS
  Administered 2013-11-11: 2 [IU] via SUBCUTANEOUS
  Administered 2013-11-12 (×2): 5 [IU] via SUBCUTANEOUS
  Administered 2013-11-12: 3 [IU] via SUBCUTANEOUS

## 2013-11-09 NOTE — Progress Notes (Signed)
[facility] PROGRESS NOTE  [name] RUE:[id number] DOB: [birth year] DOA: 11/03/2013 PCP: [name], MD Interim summary: [name] is a 57 y.o. male past medical history of hypertension hyperlipidemia PAD PVD ischemic cardiomyopathy, diabetes type 2 with a last hemoglobin A1c of 10, peripheral neuropathy that comes in for a blister that started 7 days prior to admission. It later on progressed to right foot ulcer. He was admitted to medical service for IV antibiotics and wound care consulted. S/P right fem pop bypass 11/6  Assessment/Plan:  1. Diabetic right foot ulcer with cellulitis: Initially treated with IV antibiotics-vancomycin and Cipro. Wound care consultation appreciated. Blood cultures negative to date. Xray does not show osteomyelitis. Now post op RLE fem pop bypass. Improving. Deescalated Abx to PO Bactrim. 2. PAD/critical RLE ischemia: After careful consideration & cardiology in put, patient underwent right fem pop bypass on 11/6. Mx per [facility].  3. Ischemic cardiomyopathy/Chronic systolic CHF/CAD/AICD: Patient's antihypertensives and diuretics were held secondary to hypotension. As per cardiology, 2-D echo with LVEF 20-25 percent-stable from prior, nonischemic stress test in 7/14, ICD interrogation October 2015 showed no ventricular arrhythmias. Resumed diuretics at low dose- will increase Demadex to home dose d/t weight gain and increased leg edema. Hold Metolazone. Cards resumed BB. Continue with aspirin, ACEI and statin. Stable 4. Hypokalemia: repleted.  5. Diabetes Mellitus:  Continue Levemir and SSI- no further hypoglycemia- CBG's increasing- adjust doses. A1C 12.4 suggesting poor OP control. 6. Essential HTN: controlled. 7. History of hyperlipidemia: 8. History of tobacco abuse: Cessation counseled   Code Status: Full code  Family Communication: d/w mother at bedside on 11/6.  Disposition Plan: home when medically stable. Possible DC in next 24-48  hours.   Consultants:  Wound care consult.   Vascular surgery  Cardiology  Procedures:  ABI  Aortogram with bilateral iliac arteriogram and bilateral lower extremity runoff on 11/5  RLE Fem pop bypass 11/6  Antibiotics:  Vancomycin 11/2>11/7  Ciprofloxacin 11/2>11/7  PO Bactrim 11/7>  HPI/Subjective: soreness of RLE is better. Worsened LE edema and weight gain. No SOB.  Objective:  Filed Vitals:   11/08/13 1426 11/08/13 1959 11/09/13 0519 11/09/13 1050  BP: 110/65 98/56 109/70 117/72  Pulse: 91 84 88 93  Temp: 98.4 F (36.9 C) 98.8 F (37.1 C) 98.4 F (36.9 C)   TempSrc: Oral Oral Oral   Resp: 18 18 18    Height:      Weight:   97.5 kg (214 lb 15.2 oz)   SpO2: 97% 98% 99%      Intake/Output Summary (Last 24 hours) at 11/09/13 1054 Last data filed at 11/09/13 0900  Gross per 24 hour  Intake    720 ml  Output   1725 ml  Net  -1005 ml   Filed Weights   11/07/13 2000 11/08/13 0407 11/09/13 0519  Weight: 93.8 kg (206 lb 12.7 oz) 93.8 kg (206 lb 12.7 oz) 97.5 kg (214 lb 15.2 oz)    Exam:   General:  Patient lying comfortably in bed.  Cardiovascular: s1s2, no m/r/g. No JVD or pedal edema. Telemetry: Sinus rhythm with BBB morphology.  Respiratory: clear to auscultation, no wheezing or rhonchi. No increased WOB.  Gastroenterology: soft non tender non distended bowel sounds heard.  Musculoskeletal: right foot dressing clear and dry. Surgical sites intact and clean. B/L leg edema ++   Data Reviewed: Basic Metabolic Panel:  Recent Labs Lab 11/03/13 1312 11/04/13 0640 11/06/13 0506 11/06/13 1545 11/07/13 1340 11/07/13 1515 11/08/13 0530  NA 136* 139 136*  --   --   --  134*  K 5.1 3.4* 4.0  --   --   --  4.3  CL 93* 101 100  --   --   --  100  CO2 29 26 24   --   --   --  19  GLUCOSE 408* 105* 85  --  130* 129* 76  BUN 13 11 16   --   --   --  11  CREATININE 0.81 0.57 0.83 0.68  --   --  0.56  CALCIUM 9.4 9.0 8.8  --   --   --  8.4    Liver Function Tests:  Recent Labs Lab 11/03/13 1729  AST 19  ALT 26  ALKPHOS 164*  BILITOT 0.4  PROT 8.3  ALBUMIN 3.2*   No results for input(s): LIPASE, AMYLASE in the last 168 hours. No results for input(s): AMMONIA in the last 168 hours. CBC:  Recent Labs Lab 11/03/13 1312 11/04/13 0640 11/06/13 1545 11/08/13 0530  WBC 10.7* 7.3 6.5 7.5  NEUTROABS 8.7*  --   --   --   HGB 17.0 15.3 13.9 14.2  HCT 50.4 46.6 43.0 42.5  MCV 89.0 88.8 88.5 87.3  PLT 202 187 169 153   Cardiac Enzymes: No results for input(s): CKTOTAL, CKMB, CKMBINDEX, TROPONINI in the last 168 hours. BNP (last 3 results)  Recent Labs  12/02/12 2159 05/28/13 1020  PROBNP 2717.0* 1463.0*   CBG:  Recent Labs Lab 11/08/13 0752 11/08/13 1121 11/08/13 1624 11/08/13 2120 11/09/13 0623  GLUCAP 140* 217* 212* 204* 146*    Recent Results (from the past 240 hour(s))  Blood Cultures x 2 sites     Status: None (Preliminary result)   Collection Time: 11/03/13  6:19 PM  Result Value Ref Range Status   Specimen Description BLOOD FOREARM LEFT  Final   Special Requests BOTTLES DRAWN AEROBIC AND ANAEROBIC 5CC  Final   Culture  Setup Time   Final    11/04/2013 00:53 Performed at [facility]    Culture   Final           BLOOD CULTURE RECEIVED NO GROWTH TO DATE CULTURE WILL BE HELD FOR 5 DAYS BEFORE ISSUING A FINAL NEGATIVE REPORT Performed at [facility]    Report Status PENDING  Incomplete  Blood Cultures x 2 sites     Status: None (Preliminary result)   Collection Time: 11/03/13  6:25 PM  Result Value Ref Range Status   Specimen Description BLOOD FOREARM RIGHT  Final   Special Requests BOTTLES DRAWN AEROBIC AND ANAEROBIC 5CC  Final   Culture  Setup Time   Final    11/04/2013 00:53 Performed at [facility]    Culture   Final           BLOOD CULTURE RECEIVED NO GROWTH TO DATE CULTURE WILL BE HELD FOR 5 DAYS BEFORE ISSUING A FINAL NEGATIVE REPORT Performed at  [facility]    Report Status PENDING  Incomplete  MRSA PCR Screening     Status: None   Collection Time: 11/04/13 10:15 PM  Result Value Ref Range Status   MRSA by PCR NEGATIVE NEGATIVE Final    Comment:        The GeneXpert MRSA Assay (FDA approved for NASAL specimens only), is one component of a comprehensive MRSA colonization surveillance program. It is not intended to diagnose MRSA infection nor to guide or monitor  treatment for MRSA infections.      Studies: Dg Ang/ext/uni/or Right  11/07/2013   CLINICAL DATA:  Intraoperative angiogram  EXAM: RIGHT ANG/EXT/UNI/ OR  COMPARISON:  None.  FINDINGS: There is of arterial bypass graft to the popliteal artery below-the-knee which is patent. The popliteal artery at the knee and below-the-knee is patent. Anterior tibial artery is patent with focal significant disease in the proximal third. Peroneal artery is patent to the ankle. Posterior tibial artery is patent to the ankle.  IMPRESSION: Arterial bypass graft is patent with 3 vessel runoff.   Electronically Signed   By: Maryclare BeanArt  Hoss M.D.   On: 11/07/2013 16:31    Scheduled Meds: . aspirin EC  81 mg Oral Daily  . atorvastatin  80 mg Oral QODAY  . benazepril  5 mg Oral Daily  . carvedilol  6.25 mg Oral BID WC  . collagenase   Topical Daily  . docusate sodium  100 mg Oral Daily  . enoxaparin (LOVENOX) injection  40 mg Subcutaneous Q24H  . ezetimibe  10 mg Oral Daily  . insulin aspart  0-15 Units Subcutaneous TID WC  . insulin aspart  0-5 Units Subcutaneous QHS  . insulin aspart  3 Units Subcutaneous TID WC  . insulin detemir  18 Units Subcutaneous QHS  . pantoprazole  40 mg Oral Daily  . sodium chloride  3 mL Intravenous Q12H  . sulfamethoxazole-trimethoprim  1 tablet Oral Q12H  . torsemide  60 mg Oral Daily   Continuous Infusions:    Principal Problem:   Cellulitis of right foot Active Problems:   Coronary atherosclerosis   SYSTOLIC HEART FAILURE, CHRONIC    Peripheral vascular disease   Hypertension   Cellulitis of right lower extremity   PVD (peripheral vascular disease)   Pre-operative cardiovascular examination   PAD (peripheral artery disease)    Time spent: 25 minutes.    Marcellus ScottHONGALGI,Tranesha Lessner, MD, FACP, FHM. [facility] Pager [phone]  If 7PM-7AM, please contact night-coverage [email or web address] Password [email or web address] 11/09/2013, 10:54 AM    LOS: 6 days

## 2013-11-09 NOTE — Progress Notes (Signed)
Physical Therapy Treatment Patient Details Name: Dalton RainwaterLarry D [name] MRN: [id number] DOB: Sep 27, 1955 Today's Date: 11/09/2013    History of Present Illness pt presents with R Fem Pop.      PT Comments    Pt performs mobility with more independence today despite ongoing pain, edema and ROM restrictions in operated limb.  Able to safely use RW in prep for home d/c; ascend/descend stairs, performs HEP for strengthening and edema control.  Continue to recommend HHPT for on-going rehab to operated limb in order to maximize mobility, maintain patency of graft and prevent unnecessary rehospitalization.     Follow Up Recommendations  Home health PT;Supervision - Intermittent     Equipment Recommendations  Rolling walker with 5" wheels    Recommendations for Other Services       Precautions / Restrictions Restrictions Other Position/Activity Restrictions: elevate leg when not ambulating    Mobility  Bed Mobility Overal bed mobility: Modified Independent Bed Mobility: Supine to Sit     Supine to sit: Modified independent (Device/Increase time)     General bed mobility comments: able to move to EOB unassisted  Transfers Overall transfer level: Needs assistance Equipment used: Rolling walker (2 wheeled) Transfers: Sit to/from Stand Sit to Stand: Supervision         General transfer comment: observed for preferred technique noting difficulty with initial rise due to pain, quad weakness and suboptimal positioning; cued to scoot out to EOB and alter hand placement; pt able to achieve standing safely without physical assistance  Ambulation/Gait Ambulation/Gait assistance: Supervision Ambulation Distance (Feet): 200 Feet (100 x 2 with stair training between bouts) Assistive device: Rolling walker (2 wheeled) Gait Pattern/deviations: Step-through pattern;Decreased step length - right;Decreased stance time - right;Decreased dorsiflexion - right;Decreased weight shift to  right;Antalgic;Trunk flexed Gait velocity: 1.36 ft/sec Gait velocity interpretation: <1.8 ft/sec, indicative of risk for recurrent falls General Gait Details: tends to minimize dorsiflexion passive and active throughout gait cycle due to pain and swelling in operated limb; cues to stand tall, stay close to walker to prevent neck/shoulder/back consequences from posture/weight bearing in UE's;  gait velocity increased second leg of trip with best speed recorded above.  walks in room around bed without using RW, no obvious unsteadiness noted despite operated leg restrictions.    Stairs Stairs: Yes Stairs assistance: Min guard Stair Management: One rail Left;Forwards;Backwards;Sideways Number of Stairs: 2 General stair comments: problem solving with patient to maintain antatlgic gait stair pattern with rail, preferring forward to ascend and backwards to descend; pt notes left leg weakness "i have trouble getting up from the couch" and performed single set 5 reps left step up with fair control and postural compensations; feels confident to access home  Wheelchair Mobility    Modified Rankin (Stroke Patients Only)       Balance Overall balance assessment: Modified Independent;No apparent balance deficits (not formally assessed)         Standing balance support: During functional activity Standing balance-Leahy Scale: Poor                      Cognition Arousal/Alertness: Awake/alert Behavior During Therapy: WFL for tasks assessed/performed Overall Cognitive Status: Within Functional Limits for tasks assessed                      Exercises      General Comments General comments (skin integrity, edema, etc.): moderate to severe edema noted in operated calf; pt encouraged to elevate limb whenever sitting/lying  and perform sequential muscle contractions (APs followed by QS, include diaphragmatic breathing).  Pt able to demonstrate, needs encouragement to maintain       Pertinent Vitals/Pain Pain Assessment: 0-10 Pain Score: 6  Pain Location: right leg Pain Descriptors / Indicators: Burning;Grimacing;Pressure Pain Intervention(s): Limited activity within patient's tolerance;Monitored during session;Repositioned    Home Living                      Prior Function            PT Goals (current goals can now be found in the care plan section) Acute Rehab PT Goals PT Goal Formulation: With patient Progress towards PT goals: Progressing toward goals    Frequency  Min 3X/week    PT Plan Current plan remains appropriate    Co-evaluation             End of Session Equipment Utilized During Treatment: Gait belt Activity Tolerance: Patient limited by pain Patient left: in chair;with call bell/phone within reach     Time: 0950-1020 PT Time Calculation (min): 30 min  Charges:  $Gait Training: 8-22 mins $Therapeutic Exercise: 8-22 mins                    G Codes:      [name], [name] 11/09/2013, 10:33 AM

## 2013-11-09 NOTE — Plan of Care (Signed)
Problem: Phase II Progression Outcomes Goal: Progress activity as tolerated unless otherwise ordered Outcome: Completed/Met Date Met:  11/09/13     

## 2013-11-09 NOTE — Progress Notes (Addendum)
   [facility]  Subjective  - My legs are swollen right greater than the left.  I normally take a water pill at least 3 times a day.   Objective 109/70 88 98.4 F (36.9 C) (Oral) 18 99%  Intake/Output Summary (Last 24 hours) at 11/09/13 0747 Last data filed at 11/09/13 0520  Gross per 24 hour  Intake   5470 ml  Output   1525 ml  Net   3945 ml    Right leg incisions clean and dry, right groin soft without hematoma. Doppler bilateral PT/AT.  Edema right lower extremity > left  Assessment/Planning: POD #2 Right FEMORAL-POPLITEAL ARTERY Bypass (Right) Edema right LE > Left LE. We may need to increase his diuretics.  I will leave this up to the primary care team. Thanks  I will order a rolling walker for home use. Disposition stable   Clinton GallantCOLLINS, [name] [facility] 11/09/2013 7:47 AM --  Laboratory Lab Results:  Recent Labs  11/06/13 1545 11/08/13 0530  WBC 6.5 7.5  HGB 13.9 14.2  HCT 43.0 42.5  PLT 169 153   BMET  Recent Labs  11/06/13 1545  11/07/13 1515 11/08/13 0530  NA  --   --   --  134*  K  --   --   --  4.3  CL  --   --   --  100  CO2  --   --   --  19  GLUCOSE  --   < > 129* 76  BUN  --   --   --  11  CREATININE 0.68  --   --  0.56  CALCIUM  --   --   --  8.4  < > = values in this interval not displayed.  COAG No results found for: INR, PROTIME No results found for: PTT    I have examined the patient, reviewed and agree with above.the patient's foot that looks stable. Does have full-thickness eschar over the medial aspect of his first metatarsal head. Good Doppler flow into foot.  EARLY, TODD, MD 11/09/2013 9:29 AM

## 2013-11-09 NOTE — Plan of Care (Signed)
Problem: Acute Rehab PT Goals(only PT should resolve) Goal: Pt Will Go Up/Down Stairs Modified to 1-2 stairs as pt able to clarify number of stairs to access home. (JM)

## 2013-11-10 DIAGNOSIS — I739 Peripheral vascular disease, unspecified: Secondary | ICD-10-CM

## 2013-11-10 DIAGNOSIS — E1165 Type 2 diabetes mellitus with hyperglycemia: Secondary | ICD-10-CM

## 2013-11-10 LAB — BASIC METABOLIC PANEL
ANION GAP: 16 — AB (ref 5–15)
BUN: 11 mg/dL (ref 6–23)
CO2: 22 mEq/L (ref 19–32)
Calcium: 8.3 mg/dL — ABNORMAL LOW (ref 8.4–10.5)
Chloride: 97 mEq/L (ref 96–112)
Creatinine, Ser: 0.8 mg/dL (ref 0.50–1.35)
Glucose, Bld: 93 mg/dL (ref 70–99)
Potassium: 3.6 mEq/L — ABNORMAL LOW (ref 3.7–5.3)
Sodium: 135 mEq/L — ABNORMAL LOW (ref 137–147)

## 2013-11-10 LAB — CULTURE, BLOOD (ROUTINE X 2)
Culture: NO GROWTH
Culture: NO GROWTH

## 2013-11-10 LAB — GLUCOSE, CAPILLARY
GLUCOSE-CAPILLARY: 99 mg/dL (ref 70–99)
Glucose-Capillary: 188 mg/dL — ABNORMAL HIGH (ref 70–99)
Glucose-Capillary: 162 mg/dL — ABNORMAL HIGH (ref 70–99)
Glucose-Capillary: 220 mg/dL — ABNORMAL HIGH (ref 70–99)

## 2013-11-10 MED ORDER — POTASSIUM CHLORIDE CRYS ER 20 MEQ PO TBCR
40.0000 meq | EXTENDED_RELEASE_TABLET | Freq: Once | ORAL | Status: AC
  Administered 2013-11-10: 40 meq via ORAL
  Filled 2013-11-10: qty 2

## 2013-11-10 MED ORDER — SPIRONOLACTONE 25 MG PO TABS
25.0000 mg | ORAL_TABLET | Freq: Every day | ORAL | Status: DC
  Administered 2013-11-10 – 2013-11-12 (×3): 25 mg via ORAL
  Filled 2013-11-10 (×3): qty 1

## 2013-11-10 MED ORDER — FUROSEMIDE 10 MG/ML IJ SOLN
40.0000 mg | Freq: Once | INTRAMUSCULAR | Status: AC
  Administered 2013-11-10: 40 mg via INTRAVENOUS
  Filled 2013-11-10: qty 4

## 2013-11-10 NOTE — Progress Notes (Signed)
Patient ID: Dalton RainwaterLarry D Labo, male   DOB: 09-17-1955, 58 y.o.   MRN: [id number]    Subjective:  Denies SSCP, palpitations or Dyspnea    Objective:  Filed Vitals:   11/09/13 1050 11/09/13 1350 11/09/13 2000 11/10/13 0445  BP: 117/72 97/52 98/59  101/67  Pulse: 93 83 77 81  Temp:  98.1 F (36.7 C) 98.2 F (36.8 C) 98.1 F (36.7 C)  TempSrc:  Oral Oral Oral  Resp:  18 18 18   Height:      Weight:    96 kg (211 lb 10.3 oz)  SpO2:  97% 97% 98%    Intake/Output from previous day:  Intake/Output Summary (Last 24 hours) at 11/10/13 [id number] Last data filed at 11/10/13 0815  Gross per 24 hour  Intake    480 ml  Output    300 ml  Net    180 ml    Physical Exam: Affect appropriate Chronically ill male  HEENT: normal Neck supple with no adenopathy JVP normal no bruits no thyromegaly Lungs clear with no wheezing and good diaphragmatic motion Heart:  S1/S2 MR murmur, no rub, gallop or click PMI enlarged  AICD under left clavicle  Abdomen: benighn, BS positve, no tenderness, no AAA no bruit.  No HSM or HJR S/P right fempop bypass with edema and cellulitis  Also ulcer on left malleolus    Lab Results: Basic Metabolic Panel:  Recent Labs  11/91/4709/07/16 0530 11/10/13 0438  NA 134* 135*  K 4.3 3.6*  CL 100 97  CO2 19 22  GLUCOSE 76 93  BUN 11 11  CREATININE 0.56 0.80  CALCIUM 8.4 8.3*   CBC:  Recent Labs  11/08/13 0530  WBC 7.5  HGB 14.2  HCT 42.5  MCV 87.3  PLT 153    Imaging: No results found.  Cardiac Studies:  ECG: ST rate 101  Old IMI/AMI     Telemetry:  NSR rates 70  Echo: EF 20-25%  Moderate MR   Medications:   . aspirin EC  81 mg Oral Daily  . atorvastatin  80 mg Oral QODAY  . benazepril  5 mg Oral Daily  . carvedilol  6.25 mg Oral BID WC  . collagenase   Topical Daily  . docusate sodium  100 mg Oral Daily  . enoxaparin (LOVENOX) injection  40 mg Subcutaneous Q24H  . ezetimibe  10 mg Oral Daily  . insulin aspart  0-15 Units Subcutaneous TID WC  .  insulin aspart  0-5 Units Subcutaneous QHS  . insulin aspart  3 Units Subcutaneous TID WC  . insulin detemir  18 Units Subcutaneous QHS  . pantoprazole  40 mg Oral Daily  . potassium chloride  40 mEq Oral Once  . sodium chloride  3 mL Intravenous Q12H  . spironolactone  25 mg Oral Daily  . sulfamethoxazole-trimethoprim  1 tablet Oral Q12H  . torsemide  60 mg Oral Daily       Assessment/Plan:  Cardiac:  Ischemic DCM no chest pain compensated back on asa and beta blocker stable  CHF:  With LE edema  On home dose torsemide and aldactone  Follow I/O;s  He does use some zaroxyln intermitantly  At home and would add PRN if I/O's positive  Will sign off  Outpatient f/u Dr Cindee Saltrenshaw  Areta Terwilliger [facility] 11/10/2013, 9:52 AM

## 2013-11-10 NOTE — Plan of Care (Signed)
Problem: Phase I Progression Outcomes Goal: OOB as tolerated unless otherwise ordered Outcome: Progressing Goal: Initial discharge plan identified Outcome: Completed/Met Date Met:  11/10/13 Patient will go home with home health PT when appropriate Goal: Hemodynamically stable Outcome: Completed/Met Date Met:  11/10/13  Problem: Phase II Progression Outcomes Goal: Vital signs remain stable Outcome: Completed/Met Date Met:  11/10/13 Goal: IV changed to normal saline lock Outcome: Completed/Met Date Met:  11/10/13

## 2013-11-10 NOTE — Progress Notes (Signed)
Physical Therapy Treatment Patient Details Name: [name] MRN: [id number] DOB: [birth year] Today'[name] Date: 11/10/2013    History of Present Illness pt presents with R Fem Pop.      PT Comments    Patient continues to make good progress towards physical therapy goals. Tolerates gait training,stair training, and therapeutic exercises well today. He is motivated to return home and wants to work hard during his therapy sessions. Feel he is adequate for d/c from a mobility standpoint when medically ready. Will continue to follow acutely for progression of strength, endurance, ROM, and functional independence.  Follow Up Recommendations  Home health PT;Supervision - Intermittent     Equipment Recommendations  Rolling walker with 5" wheels    Recommendations for Other Services       Precautions / Restrictions Precautions Precautions: Fall Restrictions Other Position/Activity Restrictions: elevate leg when not ambulating    Mobility  Bed Mobility Overal bed mobility: Modified Independent Bed Mobility: Supine to Sit;Sit to Supine              Transfers Overall transfer level: Needs assistance Equipment used: Rolling walker (2 wheeled) Transfers: Sit to/from Stand Sit to Stand: Supervision Stand pivot transfers: Supervision       General transfer comment: Correcty places hands on stable surface to rise. Performed from lowest bed setting without physical assist.  Ambulation/Gait Ambulation/Gait assistance: Supervision Ambulation Distance (Feet): 1000 Feet Assistive device: Rolling walker (2 wheeled) Gait Pattern/deviations: Step-through pattern;Decreased stride length;Decreased dorsiflexion - right     General Gait Details: Continues to have decreased dorsiflexion due to swelling in RLE but demonstrates good foot clearance. VC for forward gaze and upright posture. No loss of balance noted during bout.   Stairs Stairs: Yes Stairs assistance: Min guard Stair  Management: One rail Left;Step to pattern;Sideways;Forwards Number of Stairs: 2 (x2) General stair comments: VC for sequencing. Performed forwards and then educated on sideways technique. Pt reports he feels more confident with sideways technique, holding onto rail with both hands. Did not require physical assist.  Wheelchair Mobility    Modified Rankin (Stroke Patients Only)       Balance Overall balance assessment: Needs assistance         Standing balance support: During functional activity Standing balance-Leahy Scale: Fair Standing balance comment: history of PN BLE                    Cognition Arousal/Alertness: Awake/alert Behavior During Therapy: WFL for tasks assessed/performed Overall Cognitive Status: Within Functional Limits for tasks assessed                      Exercises General Exercises - Lower Extremity Ankle Circles/Pumps: AROM;Both;10 reps;Seated Quad Sets: Strengthening;Both;10 reps;Supine Long Arc Quad: Strengthening;Both;10 reps;Seated Hip Flexion/Marching: Strengthening;Both;10 reps;Seated Other Exercises Other Exercises: encouraged RLE elevation and ankle pumps    General Comments General comments (skin integrity, edema, etc.): increased edema R LE @ calf      Pertinent Vitals/Pain Pain Assessment: 0-10 Pain Score: 6  Pain Location: RLE Pain Descriptors / Indicators: Constant ("Tight") Pain Intervention([name]): Monitored during session;Repositioned    Home Living Family/patient expects to be discharged to:: Private residence Living Arrangements: Parent Available Help at Discharge: Family;Available PRN/intermittently Type of Home: House Home Access: Stairs to enter Entrance Stairs-Rails: Right Home Layout: One level Home Equipment: Walker - 2 wheels      Prior Function Level of Independence: Independent          PT Goals (current  goals can now be found in the care plan section) Acute Rehab PT Goals Patient Stated  Goal: to get out of here PT Goal Formulation: With patient Progress towards PT goals: Progressing toward goals    Frequency  Min 3X/week    PT Plan Current plan remains appropriate    Co-evaluation             End of Session   Activity Tolerance: Patient tolerated treatment well Patient left: with call bell/phone within reach;in bed;Other (comment) (RLE elevated)     Time: [id number] PT Time Calculation (min): 24 min  Charges:  $Gait Training: 8-22 mins $Therapeutic Exercise: 8-22 mins                    G Codes:      [name] 11/10/2013, 4:40 PM  [name], [place]PT [phone]

## 2013-11-10 NOTE — Progress Notes (Signed)
Occupational Therapy Evaluation Patient Details Name: Lynda RainwaterLarry D Bulnes MRN: [id number] DOB: [birth year] Today's Date: 11/10/2013    History of Present Illness 58 y.o. male past medical history of hypertension hyperlipidemia PAD PVD ischemic cardiomyopathy, diabetes type 2 with a last hemoglobin A1c of 10, peripheral neuropathy that comes in with right foot ulcer. R foot cellulitis.S/P right fem pop bypass 11/6   Clinical Impression   Pt making steady progress. Completed education regarding DME recommendations, compensatory techniques for ADL and  importance of keeping RLE elevated to decrease edema. Pt overall @ S level and will have his mother assist as needed after D/C. Pt appropriate for D/H home when medically stable. Encourage increase activity with staff as tolerated. OT signing off. Thank you.    Follow Up Recommendations  No OT follow up;Supervision - Intermittent    Equipment Recommendations  None recommended by OT    Recommendations for Other Services       Precautions / Restrictions Precautions Precautions: Fall      Mobility Bed Mobility Overal bed mobility: Modified Independent Bed Mobility: Supine to Sit;Sit to Supine              Transfers Overall transfer level: Needs assistance Equipment used: Rolling walker (2 wheeled) Transfers: Sit to/from UGI CorporationStand;Stand Pivot Transfers Sit to Stand: Supervision Stand pivot transfers: Supervision       General transfer comment: pt ambulated @ room without RW. occasionally used "furniture walking"    Balance Overall balance assessment: Needs assistance         Standing balance support: During functional activity Standing balance-Leahy Scale: Fair Standing balance comment: history of PN BLE                            ADL Overall ADL's : Needs assistance/impaired     Grooming: Modified independent;Standing   Upper Body Bathing: Set up;Sitting   Lower Body Bathing: Set up;Sit to/from stand    Upper Body Dressing : Set up   Lower Body Dressing: Minimal assistance Lower Body Dressing Details (indicate cue type and reason): assistance for socks/dressing changes R foot Toilet Transfer: Supervision/safety;Ambulation   Toileting- Clothing Manipulation and Hygiene: Supervision/safety;Sit to/from stand       Functional mobility during ADLs: Supervision/safety General ADL Comments: Requires overall set up due to pain. Pt staes his Mom will be able to asist him at this level. Discussed availability of AE to use for LB ADL. discussed recommendation of using a shower chair when bathing. Pt states he will sponge bath until he can shower and stand. RLE elevated don several pillows. discussed imprtance of keepingRLE elevated, as leg was in a dependent position on entry to room.Also discussed home safety and fall prevention.     Vision                     Perception     Praxis      Pertinent Vitals/Pain Pain Assessment: 0-10 Pain Score: 5  Pain Location: RLE Pain Descriptors / Indicators: Burning;Aching Pain Intervention(s): Limited activity within patient's tolerance;Monitored during session;Repositioned     Hand Dominance     Extremity/Trunk Assessment Upper Extremity Assessment Upper Extremity Assessment: Overall WFL for tasks assessed   Lower Extremity Assessment Lower Extremity Assessment: Defer to PT evaluation   Cervical / Trunk Assessment Cervical / Trunk Assessment: Normal   Communication Communication Communication: No difficulties   Cognition Arousal/Alertness: Awake/alert Behavior During Therapy: WFL for tasks assessed/performed Overall  Cognitive Status: Within Functional Limits for tasks assessed                     General Comments       Exercises Exercises: Other exercises Other Exercises Other Exercises: encouraged RLE elevation and ankle pumps   Shoulder Instructions      Home Living Family/patient expects to be discharged to::  Private residence Living Arrangements: Parent Available Help at Discharge: Family;Available PRN/intermittently Type of Home: House Home Access: Stairs to enter Entergy CorporationEntrance Stairs-Number of Steps: "couple" Entrance Stairs-Rails: Right Home Layout: One level     Bathroom Shower/Tub: Tub/shower unit Shower/tub characteristics: Engineer, building servicesCurtain Bathroom Toilet: Standard Bathroom Accessibility: Yes How Accessible: Accessible via walker Home Equipment: Walker - 2 wheels          Prior Functioning/Environment Level of Independence: Independent             OT Diagnosis: Generalized weakness;Acute pain   OT Problem List:     OT Treatment/Interventions:      OT Goals(Current goals can be found in the care plan section) Acute Rehab OT Goals Patient Stated Goal: to get out of here OT Goal Formulation:  (eval only)  OT Frequency:     Barriers to D/C:            Co-evaluation              End of Session Equipment Utilized During Treatment: Rolling walker Nurse Communication: Mobility status  Activity Tolerance: Patient tolerated treatment well Patient left: in bed;with call bell/phone within reach   Time: 1350-1414 OT Time Calculation (min): 24 min Charges:  OT General Charges $OT Visit: 1 Procedure OT Evaluation $Initial OT Evaluation Tier I: 1 Procedure OT Treatments $Self Care/Home Management : 8-22 mins G-Codes:    Stavros Cail,HILLARY 11/10/2013, 2:35 PM   [facility] Robben Jagiello, OTR/L  [phone] 11/10/2013

## 2013-11-10 NOTE — Progress Notes (Signed)
OT Cancellation Note  Patient Details Name: Dalton RainwaterLarry D Haro MRN: [id number] DOB: 08/14/1955   Cancelled Treatment:    Reason Eval/Treat Not Completed: Patient at procedure or test/ unavailable Will return later if able. [facility],HILLARY  Kierria Feigenbaum, OTR/L  [phone] 11/10/2013 11/10/2013, 11:07 AM

## 2013-11-10 NOTE — Progress Notes (Addendum)
    VASCULAR SURGERY ASSESSMENT AND PLAN:  Agree with note below. His right femoropopliteal bypass graft is patent. He still has significant cellulitis in the foot and will need antibiotics for several weeks I suspect. We will give the toe some chance to declare itself but he may ultimately require an extensive Ray amputation of the right first toe. I can follow this as an outpatient. In addition, I will follow the wound on his left lateral malleolus. If this does not show signs of improvement then he will require a left femoropopliteal bypass. I will arrange his follow up. He does has significant leg swelling and does have significant chronic venous insufficiency. He will need to elevate his legs as much as possible to help control this.  Dalton Ferrarihristopher Arlana Canizales, MD, FACS Beeper [phone] 8:15 AM  Subjective  - He is feeling a little better today.  His swelling has decreased some since yesterday.  Objective 101/67 81 98.1 F (36.7 C) (Oral) 18 98%  Intake/Output Summary (Last 24 hours) at 11/10/13 0735 Last data filed at 11/09/13 1700  Gross per 24 hour  Intake    600 ml  Output    500 ml  Net    100 ml   Doppler signals AT bil and PT on the right Foot wounds no change, new dressings applied Right LE incisions healing well Groin incision healing well without hematoma  Assessment/Planning: POD #3Right FEMORAL-POPLITEAL ARTERY Bypass (Right) Right LE edema mod, but improved.  Increased diuretics per IM Bilateral foot ulcers santyl daily dressing changes, IV vanc and cipro. By-pass stable  [name], [name] [facility] HospitalMAUREEN 11/10/2013 7:35 AM

## 2013-11-10 NOTE — Progress Notes (Signed)
VASCULAR LAB PRELIMINARY  ARTERIAL  ABI completed:    RIGHT    LEFT    PRESSURE WAVEFORM  PRESSURE WAVEFORM  BRACHIAL 83 Triphasic BRACHIAL 80 Triphasic  DP   DP 52 Dampened Monophasic  AT 69 Monophasic AT    PT 100 Monophasic PT 59 Monophasic    RIGHT LEFT  ABI 1.21 0.79   There has been an increase in ABIs on the right post operative with no significant change on the left.  Ardath Lepak, RVS 11/10/2013, 11:15 AM

## 2013-11-10 NOTE — Progress Notes (Signed)
[facility] PROGRESS NOTE  Dalton RainwaterLarry D [name] OZH:[id number] DOB: 1955/12/08 DOA: 11/03/2013 PCP: Neldon LabellaMILLER,[name], MD Interim summary: [name] is a 58 y.o. male past medical history of hypertension hyperlipidemia PAD PVD ischemic cardiomyopathy, diabetes type 2 with a last hemoglobin A1c of 10, peripheral neuropathy that comes in for a blister that started 7 days prior to admission. It later on progressed to right foot ulcer. He was admitted to medical service for IV antibiotics and wound care consulted. S/P right fem pop bypass 11/6  Assessment/Plan:  1. Diabetic right foot ulcer with cellulitis: Initially treated with IV antibiotics-vancomycin and Cipro. Wound care consultation appreciated. Blood cultures negative. Xray does not show osteomyelitis. Now post op RLE fem pop bypass. Improving. Deescalated Abx to PO Bactrim. As per [facility]VS, will need prolonged Abx d/t PAD, chronic venous insufficiency complicating diabetic foot ulcer and cellulitis. May eventually need right ray amputation of 1st toe. VVS will also follow left lateral malleolus wound 2. PAD/critical RLE ischemia: After careful consideration & cardiology in put, patient underwent right fem pop bypass on 11/6. Mx per VVS.  3. Ischemic cardiomyopathy/Chronic systolic CHF/CAD/AICD: Patient's antihypertensives and diuretics were held secondary to hypotension. As per cardiology, 2-D echo with LVEF 20-25 percent-stable from prior, nonischemic stress test in 7/14, ICD interrogation October 2015 showed no ventricular arrhythmias. Resumed diuretics at low dose- will increase Demadex to home dose d/t weight gain and increased leg edema. Hold Metolazone. Cards resumed BB. Continue with aspirin, ACEI and statin. Resumed Aldactone 11/9. Consider metolazone/ iv lasix if not adequate diuresis. Strict i/o. Cards signed off- OP follow up. 4. Hypokalemia: repleted.  5. Diabetes Mellitus:  Continue Levemir and SSI- no further hypoglycemia- CBG's  increasing- adjust doses. A1C 12.4 suggesting poor OP control. 6. Essential HTN: controlled. 7. History of hyperlipidemia: 8. History of tobacco abuse: Cessation counseled   Code Status: Full code  Family Communication: None at bedside. Patient declines MD's offer to discuss with family - states he will tell them.. Disposition Plan: home when medically stable. Possible DC 11/10   Consultants:  Wound care consult.   Vascular surgery  Cardiology  Procedures:  ABI  Aortogram with bilateral iliac arteriogram and bilateral lower extremity runoff on 11/5  RLE Fem pop bypass 11/6  Antibiotics:  Vancomycin 11/2>11/7  Ciprofloxacin 11/2>11/7  PO Bactrim 11/7>  HPI/Subjective: soreness of RLE is better. Worsened LE edema and weight gain - improved after diuresis yesterday. No SOB.  Objective:  Filed Vitals:   11/09/13 2000 11/10/13 0445 11/10/13 0954 11/10/13 1359  BP: 98/59 101/67 110/68 98/64  Pulse: 77 81 91 77  Temp: 98.2 F (36.8 C) 98.1 F (36.7 C)  98.2 F (36.8 C)  TempSrc: Oral Oral  Oral  Resp: 18 18  18   Height:      Weight:  96 kg (211 lb 10.3 oz)    SpO2: 97% 98%  98%     Intake/Output Summary (Last 24 hours) at 11/10/13 1710 Last data filed at 11/10/13 1556  Gross per 24 hour  Intake    240 ml  Output   1450 ml  Net  -1210 ml   Filed Weights   11/08/13 0407 11/09/13 0519 11/10/13 0445  Weight: 93.8 kg (206 lb 12.7 oz) 97.5 kg (214 lb 15.2 oz) 96 kg (211 lb 10.3 oz)    Exam:   General:  Patient sitting up comfortably at edge of bed, eating BF thia AM.  Cardiovascular: s1s2, no m/r/g. No JVD or pedal edema. Non  tele.  Respiratory: clear to auscultation, no wheezing or rhonchi. No increased WOB.  Gastroenterology: soft non tender non distended bowel sounds heard.  Musculoskeletal: right foot dressing clean and dry. Surgical sites intact and clean. B/L leg edema ++ R> L   Data Reviewed: Basic Metabolic Panel:  Recent Labs Lab  11/04/13 0640 11/06/13 0506 11/06/13 1545 11/07/13 1340 11/07/13 1515 11/08/13 0530 11/10/13 0438  NA 139 136*  --   --   --  134* 135*  K 3.4* 4.0  --   --   --  4.3 3.6*  CL 101 100  --   --   --  100 97  CO2 26 24  --   --   --  19 22  GLUCOSE 105* 85  --  130* 129* 76 93  BUN 11 16  --   --   --  11 11  CREATININE 0.57 0.83 0.68  --   --  0.56 0.80  CALCIUM 9.0 8.8  --   --   --  8.4 8.3*   Liver Function Tests:  Recent Labs Lab 11/03/13 1729  AST 19  ALT 26  ALKPHOS 164*  BILITOT 0.4  PROT 8.3  ALBUMIN 3.2*   No results for input(s): LIPASE, AMYLASE in the last 168 hours. No results for input(s): AMMONIA in the last 168 hours. CBC:  Recent Labs Lab 11/04/13 0640 11/06/13 1545 11/08/13 0530  WBC 7.3 6.5 7.5  HGB 15.3 13.9 14.2  HCT 46.6 43.0 42.5  MCV 88.8 88.5 87.3  PLT 187 169 153   Cardiac Enzymes: No results for input(s): CKTOTAL, CKMB, CKMBINDEX, TROPONINI in the last 168 hours. BNP (last 3 results)  Recent Labs  12/02/12 2159 05/28/13 1020  PROBNP 2717.0* 1463.0*   CBG:  Recent Labs Lab 11/09/13 1610 11/09/13 2057 11/10/13 0606 11/10/13 1212 11/10/13 1613  GLUCAP 259* 210* 99 188* 162*    Recent Results (from the past 240 hour(s))  Blood Cultures x 2 sites     Status: None   Collection Time: 11/03/13  6:19 PM  Result Value Ref Range Status   Specimen Description BLOOD FOREARM LEFT  Final   Special Requests BOTTLES DRAWN AEROBIC AND ANAEROBIC 5CC  Final   Culture  Setup Time   Final    11/04/2013 00:53 [facility]    Culture   Final    NO GROWTH 5 DAYS [facility]formed at [facility]    Report Status 11/10/2013 FINAL  Final  Blood Cultures x 2 sites     Status: None   Collection Time: 11/03/13  6:25 PM  Result Value Ref Range Status   Specimen Description BLOOD FOREARM RIGHT  Final   Special Requests BOTTLES DRAWN AEROBIC AND ANAEROBIC 5CC  Final   Culture  Setup Time   Final    11/04/2013  00:53 [facility]formed at [facility]    Culture   Final    NO GROWTH 5 DAYS [facility]formed at [facility]    Report Status 11/10/2013 FINAL  Final  MRSA PCR Screening     Status: None   Collection Time: 11/04/13 10:15 PM  Result Value Ref Range Status   MRSA by PCR NEGATIVE NEGATIVE Final    Comment:        The GeneXpert MRSA Assay (FDA approved for NASAL specimens only), is one component of a comprehensive MRSA colonization surveillance program. It is not intended to diagnose MRSA infection nor to guide or  monitor treatment for MRSA infections.      Studies: No results found.  Scheduled Meds: . aspirin EC  81 mg Oral Daily  . atorvastatin  80 mg Oral QODAY  . benazepril  5 mg Oral Daily  . carvedilol  6.25 mg Oral BID WC  . collagenase   Topical Daily  . docusate sodium  100 mg Oral Daily  . enoxaparin (LOVENOX) injection  40 mg Subcutaneous Q24H  . ezetimibe  10 mg Oral Daily  . insulin aspart  0-15 Units Subcutaneous TID WC  . insulin aspart  0-5 Units Subcutaneous QHS  . insulin aspart  3 Units Subcutaneous TID WC  . insulin detemir  18 Units Subcutaneous QHS  . pantoprazole  40 mg Oral Daily  . sodium chloride  3 mL Intravenous Q12H  . spironolactone  25 mg Oral Daily  . sulfamethoxazole-trimethoprim  1 tablet Oral Q12H  . torsemide  60 mg Oral Daily   Continuous Infusions:    Principal Problem:   Cellulitis of right foot Active Problems:   Coronary atherosclerosis   SYSTOLIC HEART FAILURE, CHRONIC   Peripheral vascular disease   Hypertension   Cellulitis of right lower extremity   PVD (peripheral vascular disease)   Pre-operative cardiovascular examination   PAD (peripheral artery disease)   Type 2 diabetes mellitus with hyperglycemia    Time spent: 25 minutes.    Marcellus Scott, MD, FACP, FHM. [facility] Pager [phone]  If 7PM-7AM, please contact night-coverage [email or web address] Password [email or web address] 11/10/2013, 5:10 PM    LOS: 7  days

## 2013-11-10 NOTE — Progress Notes (Signed)
[facility] PROGRESS NOTE  Dalton RainwaterLarry D [name] ZHY:[id number] DOB: [birth year] DOA: 11/03/2013 PCP: Neldon LabellaMILLER,[name], MD Interim summary: Dalton RainwaterLarry D [name] is a 58 y.o. male past medical history of hypertension hyperlipidemia PAD PVD ischemic cardiomyopathy, diabetes type 2 with a last hemoglobin A1c of 10, peripheral neuropathy that comes in for a blister that started 7 days prior to admission. It later on progressed to right foot ulcer. He was admitted to medical service for IV antibiotics and wound care consulted. S/P right fem pop bypass 11/6  Assessment/Plan:  1. Diabetic right foot ulcer with cellulitis: Initially treated with IV antibiotics-vancomycin and Cipro. Wound care consultation appreciated. Blood cultures negative. Xray does not show osteomyelitis. Now post op RLE fem pop bypass. Improving. Deescalated Abx to PO Bactrim. As per [facility], will need prolonged Abx d/t PAD, chronic venous insufficiency complicating diabetic foot ulcer and cellulitis. May eventually need right ray amputation of 1st toe. VVS will also follow left lateral malleolus wound 2. PAD/critical RLE ischemia: After careful consideration & cardiology in put, patient underwent right fem pop bypass on 11/6. Mx per VVS.  3. Ischemic cardiomyopathy/Chronic systolic CHF/CAD/AICD: Patient's antihypertensives and diuretics were held secondary to hypotension. As per cardiology, 2-D echo with LVEF 20-25 percent-stable from prior, nonischemic stress test in 7/14, ICD interrogation October 2015 showed no ventricular arrhythmias. Resumed diuretics at low dose- will increase Demadex to home dose d/t weight gain and increased leg edema. Hold Metolazone. Cards resumed BB. Continue with aspirin, ACEI and statin. Resumed Aldactone 11/9. Consider metolazone/ iv lasix if not adequate diuresis. Strict i/o. Cards signed off- OP follow up. 4. Hypokalemia: repleted.  5. Diabetes Mellitus:  Continue Levemir and SSI- no further hypoglycemia- CBG's  increasing- adjust doses. A1C 12.4 suggesting poor OP control. 6. Essential HTN: controlled. 7. History of hyperlipidemia: 8. History of tobacco abuse: Cessation counseled   Code Status: Full code  Family Communication: None at bedside  Disposition Plan: home when medically stable. Possible DC 11/10   Consultants:  Wound care consult.   Vascular surgery  Cardiology  Procedures:  ABI  Aortogram with bilateral iliac arteriogram and bilateral lower extremity runoff on 11/5  RLE Fem pop bypass 11/6  Antibiotics:  Vancomycin 11/2>11/7  Ciprofloxacin 11/2>11/7  PO Bactrim 11/7>  HPI/Subjective: soreness of RLE is better. Worsened LE edema and weight gain - improved after diuresis yesterday. No SOB.  Objective:  Filed Vitals:   11/09/13 1350 11/09/13 2000 11/10/13 0445 11/10/13 0954  BP: 97/52 98/59 101/67 110/68  Pulse: 83 77 81 91  Temp: 98.1 F (36.7 C) 98.2 F (36.8 C) 98.1 F (36.7 C)   TempSrc: Oral Oral Oral   Resp: 18 18 18    Height:      Weight:   96 kg (211 lb 10.3 oz)   SpO2: 97% 97% 98%      Intake/Output Summary (Last 24 hours) at 11/10/13 1133 Last data filed at 11/10/13 0815  Gross per 24 hour  Intake    480 ml  Output    300 ml  Net    180 ml   Filed Weights   11/08/13 0407 11/09/13 0519 11/10/13 0445  Weight: 93.8 kg (206 lb 12.7 oz) 97.5 kg (214 lb 15.2 oz) 96 kg (211 lb 10.3 oz)    Exam:   General:  Patient sitting up comfortably at edge of bed, eating BF thia AM.  Cardiovascular: s1s2, no m/r/g. No JVD or pedal edema. Non tele.  Respiratory: clear to auscultation, no wheezing or rhonchi.  No increased WOB.  Gastroenterology: soft non tender non distended bowel sounds heard.  Musculoskeletal: right foot dressing clean and dry. Surgical sites intact and clean. B/L leg edema ++ R> L   Data Reviewed: Basic Metabolic Panel:  Recent Labs Lab 11/03/13 1312 11/04/13 0640 11/06/13 0506 11/06/13 1545 11/07/13 1340  11/07/13 1515 11/08/13 0530 11/10/13 0438  NA 136* 139 136*  --   --   --  134* 135*  K 5.1 3.4* 4.0  --   --   --  4.3 3.6*  CL 93* 101 100  --   --   --  100 97  CO2 29 26 24   --   --   --  19 22  GLUCOSE 408* 105* 85  --  130* 129* 76 93  BUN 13 11 16   --   --   --  11 11  CREATININE 0.81 0.57 0.83 0.68  --   --  0.56 0.80  CALCIUM 9.4 9.0 8.8  --   --   --  8.4 8.3*   Liver Function Tests:  Recent Labs Lab 11/03/13 1729  AST 19  ALT 26  ALKPHOS 164*  BILITOT 0.4  PROT 8.3  ALBUMIN 3.2*   No results for input(s): LIPASE, AMYLASE in the last 168 hours. No results for input(s): AMMONIA in the last 168 hours. CBC:  Recent Labs Lab 11/03/13 1312 11/04/13 0640 11/06/13 1545 11/08/13 0530  WBC 10.7* 7.3 6.5 7.5  NEUTROABS 8.7*  --   --   --   HGB 17.0 15.3 13.9 14.2  HCT 50.4 46.6 43.0 42.5  MCV 89.0 88.8 88.5 87.3  PLT 202 187 169 153   Cardiac Enzymes: No results for input(s): CKTOTAL, CKMB, CKMBINDEX, TROPONINI in the last 168 hours. BNP (last 3 results)  Recent Labs  12/02/12 2159 05/28/13 1020  PROBNP 2717.0* 1463.0*   CBG:  Recent Labs Lab 11/09/13 0623 11/09/13 1122 11/09/13 1610 11/09/13 2057 11/10/13 0606  GLUCAP 146* 233* 259* 210* 99    Recent Results (from the past 240 hour(s))  Blood Cultures x 2 sites     Status: None   Collection Time: 11/03/13  6:19 PM  Result Value Ref Range Status   Specimen Description BLOOD FOREARM LEFT  Final   Special Requests BOTTLES DRAWN AEROBIC AND ANAEROBIC 5CC  Final   Culture  Setup Time   Final    11/04/2013 00:53 Performed at [facility]    Culture   Final    NO GROWTH 5 DAYS Performed at [facility]    Report Status 11/10/2013 FINAL  Final  Blood Cultures x 2 sites     Status: None   Collection Time: 11/03/13  6:25 PM  Result Value Ref Range Status   Specimen Description BLOOD FOREARM RIGHT  Final   Special Requests BOTTLES DRAWN AEROBIC AND ANAEROBIC 5CC  Final    Culture  Setup Time   Final    11/04/2013 00:53 Performed at [facility]    Culture   Final    NO GROWTH 5 DAYS Performed at [facility]    Report Status 11/10/2013 FINAL  Final  MRSA PCR Screening     Status: None   Collection Time: 11/04/13 10:15 PM  Result Value Ref Range Status   MRSA by PCR NEGATIVE NEGATIVE Final    Comment:        The GeneXpert MRSA Assay (FDA approved for NASAL specimens only), is one component  of a comprehensive MRSA colonization surveillance program. It is not intended to diagnose MRSA infection nor to guide or monitor treatment for MRSA infections.      Studies: No results found.  Scheduled Meds: . aspirin EC  81 mg Oral Daily  . atorvastatin  80 mg Oral QODAY  . benazepril  5 mg Oral Daily  . carvedilol  6.25 mg Oral BID WC  . collagenase   Topical Daily  . docusate sodium  100 mg Oral Daily  . enoxaparin (LOVENOX) injection  40 mg Subcutaneous Q24H  . ezetimibe  10 mg Oral Daily  . insulin aspart  0-15 Units Subcutaneous TID WC  . insulin aspart  0-5 Units Subcutaneous QHS  . insulin aspart  3 Units Subcutaneous TID WC  . insulin detemir  18 Units Subcutaneous QHS  . pantoprazole  40 mg Oral Daily  . sodium chloride  3 mL Intravenous Q12H  . spironolactone  25 mg Oral Daily  . sulfamethoxazole-trimethoprim  1 tablet Oral Q12H  . torsemide  60 mg Oral Daily   Continuous Infusions:    Principal Problem:   Cellulitis of right foot Active Problems:   Coronary atherosclerosis   SYSTOLIC HEART FAILURE, CHRONIC   Peripheral vascular disease   Hypertension   Cellulitis of right lower extremity   PVD (peripheral vascular disease)   Pre-operative cardiovascular examination   PAD (peripheral artery disease)    Time spent: 25 minutes.    Marcellus ScottHONGALGI,Breanah Faddis, MD, FACP, FHM. [facility] Pager [phone]  If 7PM-7AM, please contact night-coverage [email or web address] Password [email or web address] 11/10/2013, 11:33 AM    LOS: 7 days

## 2013-11-11 ENCOUNTER — Encounter (HOSPITAL_COMMUNITY): Payer: Self-pay | Admitting: Vascular Surgery

## 2013-11-11 LAB — GLUCOSE, CAPILLARY
Glucose-Capillary: 142 mg/dL — ABNORMAL HIGH (ref 70–99)
Glucose-Capillary: 245 mg/dL — ABNORMAL HIGH (ref 70–99)
Glucose-Capillary: 215 mg/dL — ABNORMAL HIGH (ref 70–99)
Glucose-Capillary: 149 mg/dL — ABNORMAL HIGH (ref 70–99)

## 2013-11-11 LAB — BASIC METABOLIC PANEL
Anion gap: 12 (ref 5–15)
BUN: 11 mg/dL (ref 6–23)
CO2: 28 mEq/L (ref 19–32)
Calcium: 8.4 mg/dL (ref 8.4–10.5)
Chloride: 96 mEq/L (ref 96–112)
Creatinine, Ser: 0.96 mg/dL (ref 0.50–1.35)
GFR calc Af Amer: >90 mL/min (ref >90)
GFR calc non Af Amer: 89 mL/min — ABNORMAL LOW (ref >90)
Glucose, Bld: 160 mg/dL — ABNORMAL HIGH (ref 70–99)
Potassium: 3.6 mEq/L — ABNORMAL LOW (ref 3.7–5.3)
Sodium: 136 mEq/L — ABNORMAL LOW (ref 137–147)

## 2013-11-11 LAB — CBC
HCT: 37.8 % — ABNORMAL LOW (ref 39.0–52.0)
Hemoglobin: 12.2 g/dL — ABNORMAL LOW (ref 13.0–17.0)
MCH: 28.1 pg (ref 26.0–34.0)
MCHC: 32.3 g/dL (ref 30.0–36.0)
MCV: 87.1 fL (ref 78.0–100.0)
Platelets: 165 10*3/uL (ref 150–400)
RBC: 4.34 MIL/uL (ref 4.22–5.81)
RDW: 14.7 % (ref 11.5–15.5)
WBC: 6.1 10*3/uL (ref 4.0–10.5)

## 2013-11-11 MED ORDER — POTASSIUM CHLORIDE CRYS ER 20 MEQ PO TBCR
40.0000 meq | EXTENDED_RELEASE_TABLET | Freq: Once | ORAL | Status: AC
  Administered 2013-11-11: 40 meq via ORAL
  Filled 2013-11-11: qty 2

## 2013-11-11 MED ORDER — FUROSEMIDE 10 MG/ML IJ SOLN
40.0000 mg | Freq: Once | INTRAMUSCULAR | Status: AC
  Administered 2013-11-11: 40 mg via INTRAVENOUS
  Filled 2013-11-11: qty 4

## 2013-11-11 NOTE — Plan of Care (Signed)
Problem: Phase I Progression Outcomes Goal: OOB as tolerated unless otherwise ordered Outcome: Completed/Met Date Met:  11/11/13 Goal: Other Phase I Outcomes/Goals Outcome: Completed/Met Date Met:  11/11/13  Problem: Phase II Progression Outcomes Goal: Discharge plan established Outcome: Completed/Met Date Met:  11/11/13 Goal: Other Phase II Outcomes/Goals Outcome: Completed/Met Date Met:  11/11/13

## 2013-11-11 NOTE — Progress Notes (Addendum)
   [facility]  Subjective  - He feels like he is still 20 lbs. Over his normal weight due to fluid.  His right leg swelling is continuing to resolve with time.   Objective 99/64 79 97.9 F (36.6 C) (Oral) 18 98%  Intake/Output Summary (Last 24 hours) at 11/11/13 0926 Last data filed at 11/11/13 0359  Gross per 24 hour  Intake    240 ml  Output   3325 ml  Net  -3085 ml   Doppler signals AT bil and PT on the right Right LE incisions healing well, decreased edema Groin incision healing well without hematoma   Assessment/Planning: POD #4 Right FEMORAL-POPLITEAL ARTERY Bypass  Right LE edema mod, but improved. By-pass stable dry dressing with santyl daily to bil. Foot wounds. F/U with Dr. Edilia Boickson in 3 weeks after discharge   [name], [facility] LLCEMMA [facility] HospitalMAUREEN 11/11/2013 9:26 AM --  Laboratory Lab Results:  Recent Labs  11/11/13 0456  WBC 6.1  HGB 12.2*  HCT 37.8*  PLT 165   BMET  Recent Labs  11/10/13 0438 11/11/13 0456  NA 135* 136*  K 3.6* 3.6*  CL 97 96  CO2 22 28  GLUCOSE 93 160*  BUN 11 11  CREATININE 0.80 0.96  CALCIUM 8.3* 8.4    COAG No results found for: INR, PROTIME No results found for: PTT

## 2013-11-11 NOTE — Progress Notes (Signed)
Nursing note  Dr Waymon AmatoHongalgi, made aware of pt SBP in the 96-98 range, orders received for  Parameters for Coreg and Lotensin will administer per orders and continue to monitor patient. Makiah Foye, [employer]Kristin Jessup\RN

## 2013-11-11 NOTE — Progress Notes (Addendum)
Nursing note Patient ambulating in hallway with rolling walker. Tolerating well gait steady. Will monitor patient. Camella Seim, Randall AnKristin Jessup RN

## 2013-11-11 NOTE — Progress Notes (Signed)
Inpatient Diabetes Program Recommendations  AACE/ADA: New Consensus Statement on Inpatient Glycemic Control (2013)  Target Ranges:  Prepandial:   less than 140 mg/dL      Peak postprandial:   less than 180 mg/dL (1-2 hours)      Critically ill patients:  140 - 180 mg/dL   Reason for Visit: Hyperglycemia  Results for [name], [name] (MRN [id number]) as of 11/11/2013 16:51  Ref. Range 11/10/2013 12:12 11/10/2013 16:13 11/10/2013 21:25 11/11/2013 05:50 11/11/2013 11:14  Glucose-Capillary Latest Range: 70-99 mg/dL 284188 (H) 132162 (H) 440220 (H) 142 (H) 245 (H)  FBS ok. Needs increase in meal coverage insulin for post-prandial hyperglycemia. Will need adjustment to home diabetes meds prior to discharge.   Inpatient Diabetes Program Recommendations Insulin - Meal Coverage: Increase Novolog to 6 units tidwc for meal coverage insulin  Note: Will continue to follow. Thank you. Ailene Ardshonda Delila Kuklinski, RD, LDN, CDE Inpatient Diabetes Coordinator [phone]

## 2013-11-11 NOTE — Care Management Note (Signed)
    Page 1 of 1   11/12/2013     2:17:22 PM CARE MANAGEMENT NOTE 11/12/2013  Patient:  Lynda RainwaterFULP,Octavion D   Account Number:  [id number]  Date Initiated:  11/11/2013  Documentation initiated by:  Thor Nannini  Subjective/Objective Assessment:   Pt adm on 11/03/13 with RLE cellulitis, CHF; s/p fem pop on 11/6.    PTA, pt resided at home with mother.     Action/Plan:   PT recommending HHPT at dc.  [facility]  Will follow for HH orders.   Anticipated DC Date:  11/12/2013   Anticipated DC Plan:  HOME W HOME HEALTH SERVICES      DC Planning Services  CM consult      Choice offered to / List presented to:          [facility]  [facility] - 11 Patient Refused      Status of service:  Completed, signed off Medicare Important Message given?  YES (If response is "NO", the following Medicare IM given date fields will be blank) Date Medicare IM given:  11/10/2013 Medicare IM given by:  Stefen Juba Date Additional Medicare IM given:   Additional Medicare IM given by:    Discharge Disposition:  HOME/SELF CARE  Per UR Regulation:  Reviewed for med. necessity/level of care/duration of stay  If discussed at Long Length of Stay Meetings, dates discussed:   11/11/2013    Comments:  11/12/13 Sidney AceJulie Kinnick Maus, RN, BSN [phone] Pt refused home health care as recommended by therapists and physician.  Pt states he has RW for home use.

## 2013-11-11 NOTE — Progress Notes (Signed)
[facility] PROGRESS NOTE  [name] ZOX:[id number] DOB: 12/09/1955 DOA: 11/03/2013 PCP: [name], MD Interim summary: [name] is a 58 y.o. male past medical history of hypertension hyperlipidemia PAD PVD ischemic cardiomyopathy, diabetes type 2 with a last hemoglobin A1c of 10, peripheral neuropathy that comes in for a blister that started 7 days prior to admission. It later on progressed to right foot ulcer. He was admitted to medical service for IV antibiotics and wound care consulted. S/P right fem pop bypass 11/6  Assessment/Plan:  1. Diabetic right foot ulcer with cellulitis: Initially treated with IV antibiotics-vancomycin and Cipro. Wound care consultation appreciated. Blood cultures negative. Xray does not show osteomyelitis. Now post op RLE fem pop bypass. Improving. Deescalated Abx to PO Bactrim. As per [facility], will need prolonged Abx d/t PAD, chronic venous insufficiency complicating diabetic foot ulcer and cellulitis. May eventually need right ray amputation of 1st toe. [facility] will also follow left lateral malleolus wound. OP follow up with Dr. Edilia Bo in 3 weeks. 2. PAD/critical RLE ischemia: After careful consideration & cardiology in put, patient underwent right fem pop bypass on 11/6. Mx per [facility].  3. Ischemic cardiomyopathy/Chronic systolic CHF/CAD/AICD: Patient's antihypertensives and diuretics were held secondary to hypotension. As per cardiology, 2-D echo with LVEF 20-25 percent-stable from prior, nonischemic stress test in 7/14, ICD interrogation October 2015 showed no ventricular arrhythmias. Resumed Demadex & Aldactone at home dose d/t weight gain and increased leg edema. Hold Metolazone. Cards resumed BB. Continue with aspirin, ACEI and statin. Strict i/o. Cards signed off- OP follow up. Received a dose of Lasix IV 40 mg 11/9 with good diuresis and patient states that he lost 6 lbs in 24 hours but still has excess fluid. Rpt IV lasix today. 4. Hypokalemia: replete  and follow.  5. Diabetes Mellitus:  Continue Levemir and SSI- no further hypoglycemia- CBG's increasing- adjust doses. A1C 12.4 suggesting poor OP control. 6. Essential HTN: controlled. 7. History of hyperlipidemia: 8. History of tobacco abuse: Cessation counseled   Code Status: Full code  Family Communication: None at bedside. Patient declines MD's offer to discuss with family - states he will tell them.. Disposition Plan: home when medically stable. Possible DC 11/11   Consultants:  Wound care consult.   Vascular surgery  Cardiology  Procedures:  ABI  Aortogram with bilateral iliac arteriogram and bilateral lower extremity runoff on 11/5  RLE Fem pop bypass 11/6  Antibiotics:  Vancomycin 11/2>11/7  Ciprofloxacin 11/2>11/7  PO Bactrim 11/7>  HPI/Subjective: Diuresing. States leg swellings better but still has lot of fluid.  Objective:  Filed Vitals:   11/11/13 0545 11/11/13 0825 11/11/13 1141 11/11/13 1431  BP: 99/64 98/64 110/70 107/57  Pulse: 79 81 84 83  Temp: 97.9 F (36.6 C) 98.2 F (36.8 C)  97.9 F (36.6 C)  TempSrc: Oral Oral  Oral  Resp: 18 18  20   Height:      Weight: 94.9 kg (209 lb 3.5 oz)     SpO2: 98% 99%  98%     Intake/Output Summary (Last 24 hours) at 11/11/13 1633 Last data filed at 11/11/13 1541  Gross per 24 hour  Intake    360 ml  Output   3175 ml  Net  -2815 ml   Filed Weights   11/09/13 0519 11/10/13 0445 11/11/13 0545  Weight: 97.5 kg (214 lb 15.2 oz) 96 kg (211 lb 10.3 oz) 94.9 kg (209 lb 3.5 oz)    Exam:   General:  Patient ambulating  comfortably in halls using walker.  Cardiovascular: s1s2, no m/r/g. No JVD or pedal edema. Non tele.  Respiratory: clear to auscultation, no wheezing or rhonchi. No increased WOB.  Gastroenterology: soft non tender non distended bowel sounds heard.  Musculoskeletal: right foot dressing clean and dry. Surgical sites intact and clean. B/L leg edema ++ R> L : decreasing.   Data  Reviewed: Basic Metabolic Panel:  Recent Labs Lab 11/06/13 0506 11/06/13 1545 11/07/13 1340 11/07/13 1515 11/08/13 0530 11/10/13 0438 11/11/13 0456  NA 136*  --   --   --  134* 135* 136*  K 4.0  --   --   --  4.3 3.6* 3.6*  CL 100  --   --   --  100 97 96  CO2 24  --   --   --  19 22 28   GLUCOSE 85  --  130* 129* 76 93 160*  BUN 16  --   --   --  11 11 11   CREATININE 0.83 0.68  --   --  0.56 0.80 0.96  CALCIUM 8.8  --   --   --  8.4 8.3* 8.4   Liver Function Tests: No results for input(s): AST, ALT, ALKPHOS, BILITOT, PROT, ALBUMIN in the last 168 hours. No results for input(s): LIPASE, AMYLASE in the last 168 hours. No results for input(s): AMMONIA in the last 168 hours. CBC:  Recent Labs Lab 11/06/13 1545 11/08/13 0530 11/11/13 0456  WBC 6.5 7.5 6.1  HGB 13.9 14.2 12.2*  HCT 43.0 42.5 37.8*  MCV 88.5 87.3 87.1  PLT 169 153 165   Cardiac Enzymes: No results for input(s): CKTOTAL, CKMB, CKMBINDEX, TROPONINI in the last 168 hours. BNP (last 3 results)  Recent Labs  12/02/12 2159 05/28/13 1020  PROBNP 2717.0* 1463.0*   CBG:  Recent Labs Lab 11/10/13 1212 11/10/13 1613 11/10/13 2125 11/11/13 0550 11/11/13 1114  GLUCAP 188* 162* 220* 142* 245*    Recent Results (from the past 240 hour(s))  Blood Cultures x 2 sites     Status: None   Collection Time: 11/03/13  6:19 PM  Result Value Ref Range Status   Specimen Description BLOOD FOREARM LEFT  Final   Special Requests BOTTLES DRAWN AEROBIC AND ANAEROBIC 5CC  Final   Culture  Setup Time   Final    11/04/2013 00:53 [facility]    Culture   Final    NO GROWTH 5 DAYS Performed at [facility]    Report Status 11/10/2013 FINAL  Final  Blood Cultures x 2 sites     Status: None   Collection Time: 11/03/13  6:25 PM  Result Value Ref Range Status   Specimen Description BLOOD FOREARM RIGHT  Final   Special Requests BOTTLES DRAWN AEROBIC AND ANAEROBIC 5CC  Final   Culture   Setup Time   Final    11/04/2013 00:53 [facility]formed at [facility]    Culture   Final    NO GROWTH 5 DAYS [facility]formed at [facility]    Report Status 11/10/2013 FINAL  Final  MRSA PCR Screening     Status: None   Collection Time: 11/04/13 10:15 PM  Result Value Ref Range Status   MRSA by PCR NEGATIVE NEGATIVE Final    Comment:        The GeneXpert MRSA Assay (FDA approved for NASAL specimens only), is one component of a comprehensive MRSA colonization surveillance program. It is not intended to  diagnose MRSA infection nor to guide or monitor treatment for MRSA infections.      Studies: No results found.  Scheduled Meds: . aspirin EC  81 mg Oral Daily  . atorvastatin  80 mg Oral QODAY  . benazepril  5 mg Oral Daily  . carvedilol  6.25 mg Oral BID WC  . collagenase   Topical Daily  . docusate sodium  100 mg Oral Daily  . enoxaparin (LOVENOX) injection  40 mg Subcutaneous Q24H  . ezetimibe  10 mg Oral Daily  . insulin aspart  0-15 Units Subcutaneous TID WC  . insulin aspart  0-5 Units Subcutaneous QHS  . insulin aspart  3 Units Subcutaneous TID WC  . insulin detemir  18 Units Subcutaneous QHS  . pantoprazole  40 mg Oral Daily  . sodium chloride  3 mL Intravenous Q12H  . spironolactone  25 mg Oral Daily  . sulfamethoxazole-trimethoprim  1 tablet Oral Q12H  . torsemide  60 mg Oral Daily   Continuous Infusions:    Principal Problem:   Cellulitis of right foot Active Problems:   Coronary atherosclerosis   SYSTOLIC HEART FAILURE, CHRONIC   Peripheral vascular disease   Hypertension   Cellulitis of right lower extremity   PVD (peripheral vascular disease)   Pre-operative cardiovascular examination   PAD (peripheral artery disease)   Type 2 diabetes mellitus with hyperglycemia    Time spent: 25 minutes.    Marcellus ScottHONGALGI,Estella Malatesta, MD, FACP, FHM. [facility] Pager [phone]  If 7PM-7AM, please contact night-coverage [email or web address] Password  [email or web address] 11/11/2013, 4:33 PM    LOS: 8 days

## 2013-11-11 NOTE — Plan of Care (Signed)
Problem: Phase III Progression Outcomes Goal: Pain controlled on oral analgesia Outcome: Completed/Met Date Met:  11/11/13 Goal: Voiding independently Outcome: Completed/Met Date Met:  11/11/13

## 2013-11-12 ENCOUNTER — Telehealth: Payer: Self-pay | Admitting: Vascular Surgery

## 2013-11-12 LAB — GLUCOSE, CAPILLARY
GLUCOSE-CAPILLARY: 225 mg/dL — AB (ref 70–99)
GLUCOSE-CAPILLARY: 223 mg/dL — AB (ref 70–99)
GLUCOSE-CAPILLARY: 174 mg/dL — AB (ref 70–99)

## 2013-11-12 LAB — BASIC METABOLIC PANEL
Anion gap: 12 (ref 5–15)
BUN: 12 mg/dL (ref 6–23)
CO2: 28 mEq/L (ref 19–32)
Calcium: 8.4 mg/dL (ref 8.4–10.5)
Chloride: 97 mEq/L (ref 96–112)
Creatinine, Ser: 0.84 mg/dL (ref 0.50–1.35)
GFR calc Af Amer: >90 mL/min (ref >90)
GLUCOSE: 214 mg/dL — AB (ref 70–99)
Potassium: 3.8 mEq/L (ref 3.7–5.3)
Sodium: 137 mEq/L (ref 137–147)

## 2013-11-12 MED ORDER — HYDROCODONE-ACETAMINOPHEN 5-325 MG PO TABS: 1.0000 | ORAL_TABLET | Freq: Four times a day (QID) | ORAL | Status: DC | PRN

## 2013-11-12 MED ORDER — INSULIN DETEMIR 100 UNIT/ML ~~LOC~~ SOLN: 18.0000 [IU] | Freq: Every day | SUBCUTANEOUS | Status: DC

## 2013-11-12 MED ORDER — GLUCOSE BLOOD VI STRP: ORAL_STRIP | Status: DC

## 2013-11-12 MED ORDER — PANTOPRAZOLE SODIUM 40 MG PO TBEC: 40.0000 mg | DELAYED_RELEASE_TABLET | Freq: Every day | ORAL | Status: DC

## 2013-11-12 MED ORDER — FREESTYLE SYSTEM KIT: 1.0000 | PACK | Status: DC | PRN

## 2013-11-12 MED ORDER — INSULIN ASPART 100 UNIT/ML ~~LOC~~ SOLN: 3.0000 [IU] | Freq: Three times a day (TID) | SUBCUTANEOUS | Status: DC

## 2013-11-12 MED ORDER — SENNOSIDES-DOCUSATE SODIUM 8.6-50 MG PO TABS: 1.0000 | ORAL_TABLET | Freq: Every evening | ORAL | Status: DC | PRN

## 2013-11-12 MED ORDER — METOLAZONE 2.5 MG PO TABS: 2.5000 mg | ORAL_TABLET | ORAL | Status: DC

## 2013-11-12 MED ORDER — SULFAMETHOXAZOLE-TRIMETHOPRIM 800-160 MG PO TABS: 1.0000 | ORAL_TABLET | Freq: Two times a day (BID) | ORAL | Status: AC

## 2013-11-12 MED ORDER — FUROSEMIDE 10 MG/ML IJ SOLN
40.0000 mg | Freq: Once | INTRAMUSCULAR | Status: AC
  Administered 2013-11-12: 40 mg via INTRAVENOUS
  Filled 2013-11-12 (×2): qty 4

## 2013-11-12 MED ORDER — CARVEDILOL 6.25 MG PO TABS: 6.2500 mg | ORAL_TABLET | Freq: Two times a day (BID) | ORAL | Status: DC

## 2013-11-12 NOTE — Telephone Encounter (Signed)
notified patient of post op appt. with dr. Edilia Bodickson on 12-03-13 at 10:30 am

## 2013-11-12 NOTE — Telephone Encounter (Addendum)
-----   Message from Sharee PimpleMarilyn K McChesney, RN sent at 11/11/2013 11:18 AM EST ----- Regarding: Schedule   ----- Message -----    From: Lars MageEmma M Collins, PA-C    Sent: 11/11/2013   9:33 AM      To: [facility] Charge Pool  F/U with Dr. Edilia Boickson in 3 weeks s/p right fem-pop bil. Foot wounds.  11/12/13: left msg for pt re appt, dpm

## 2013-11-12 NOTE — Progress Notes (Signed)
Spoke with patient about diabetes and home regimen for diabetes control. Patient reports that he is followed by Dr. Sharl MaKerr for diabetes management and currently he takes Amaryl 4 mg QAM, Jardiance 25 mg daily, Novolin N 18 units daily as an outpatient for diabetes control.  Inquired about knowledge about A1C and patient reports that he knows what an A1C is. Discussed A1C results (12.4 on 11/03/13) and stressed importance of checking CBGs and maintaining good CBG control to prevent long-term and short-term complications. Discussed impact of nutrition, exercise, stress, sickness, and medications on diabetes control.  Patient states that he was started on Jardiance about 6 months ago and he is working with Dr. Sharl MaKerr to improve his diabetes control. Patient admits that he sometimes forgets to take his medication and does not check his glucose as often as he should. He states that the cost of test strips is one of the reasons he does not check his glucose as often as prescribed. According to the patient his insurance does not cover any of the cost of test strips. Informed patient about the [employer] Reli-On glucometer and test strips and patient reports that he already knows about that meter. Encouraged patient to try to set reminders or think of other ideas to help him with taking his medications and checking his glucose. Emphasized that the more glucose values he has the more information Dr.Kerr would have to make changes with this diabetes medications.  Patient verbalized understanding of information discussed and he states that he has no further questions at this time related to diabetes. Patient agreed to complete the PQH-9 for a research project our inpatient diabetes team is conducting and patient scored 8; therefore consult was made for social worker consult.  Thanks, Orlando PennerMarie Bawi Lakins, RN, MSN, CCRN Diabetes Coordinator Inpatient Diabetes Program [phone] (Team Pager) [phone] ([facility] office) [phone] [facility]  office)

## 2013-11-12 NOTE — Discharge Summary (Signed)
  Physician Discharge Summary  [name] MRN: [id number] DOB/AGE: 09/22/1955 58 y.o.  PCP: [name], MD   Admit date: 11/03/2013 Discharge date: 11/12/2013  Discharge Diagnoses:      Cellulitis of right foot Status post Right FEMORAL-POPLITEAL ARTERY Bypass    Coronary atherosclerosis   SYSTOLIC HEART FAILURE, CHRONIC   Peripheral vascular disease   Hypertension   Cellulitis of right lower extremity   PVD (peripheral vascular disease)   Pre-operative cardiovascular examination   PAD (peripheral artery disease)   Type 2 diabetes mellitus with hyperglycemia   Follow-up recommendations F/U with [name] in 3 weeks after discharge Follow-up with PCP in 5-7 days    Medication List    STOP taking these medications        JARDIANCE 25 MG Tabs tablet  Generic drug:  empagliflozin     NOVOLIN N RELION 100 UNIT/ML injection  Generic drug:  insulin NPH Human      TAKE these medications        aspirin 81 MG tablet  Take 1 tablet (81 mg total) by mouth daily.     atorvastatin 80 MG tablet  Commonly known as:  LIPITOR  Take 80 mg by mouth every other day.     benazepril 5 MG tablet  Commonly known as:  LOTENSIN  Take 5 mg by mouth daily.     carvedilol 6.25 MG tablet  Commonly known as:  COREG  Take 1 tablet (6.25 mg total) by mouth 2 (two) times daily with a meal.     ezetimibe 10 MG tablet  Commonly known as:  ZETIA  Take 10 mg by mouth daily.     glimepiride 4 MG tablet  Commonly known as:  AMARYL  Take 4 mg by mouth daily before breakfast.     HYDROcodone-acetaminophen 5-325 MG per tablet  Commonly known as:  NORCO/VICODIN  Take 1 tablet by mouth every 6 (six) hours as needed for moderate pain.     insulin aspart 100 UNIT/ML injection  Commonly known as:  novoLOG  Inject 3 Units into the skin 3 (three) times daily with meals.     insulin detemir 100 UNIT/ML injection  Commonly known as:  LEVEMIR  Inject 0.18 mLs (18 Units total)  into the skin at bedtime.     loratadine 10 MG tablet  Commonly known as:  CLARITIN  Take 10 mg by mouth daily as needed.     metolazone 2.5 MG tablet  Commonly known as:  ZAROXOLYN  Take 2.5 mg by mouth See admin instructions. Take 2.5 mg daily for 2 consecutive days (two tablets per week) if needed for excess fluid when LASIX does not work.     pantoprazole 40 MG tablet  Commonly known as:  PROTONIX  Take 1 tablet (40 mg total) by mouth daily.     polyethylene glycol packet  Commonly known as:  MIRALAX / GLYCOLAX  Take 17 g by mouth daily as needed.     potassium chloride 10 MEQ tablet  Commonly known as:  K-DUR,KLOR-CON  as needed.     senna-docusate 8.6-50 MG per tablet  Commonly known as:  Senokot-S  Take 1 tablet by mouth at bedtime as needed for mild constipation.     spironolactone 25 MG tablet  Commonly known as:  ALDACTONE  Take 1 tablet (25 mg total) by mouth daily.     sulfamethoxazole-trimethoprim 800-160 MG per tablet  Commonly known as:  BACTRIM DS,SEPTRA DS  Take   1 tablet by mouth 2 (two) times daily.     torsemide 20 MG tablet  Commonly known as:  DEMADEX  Take 60 mg by mouth daily.        Discharge Condition:stable  Disposition: 01-Home or Self Care   Consults: Vascular surgery  Significant Diagnostic Studies: Dg Ang/ext/uni/or Right  11/07/2013   CLINICAL DATA:  Intraoperative angiogram  EXAM: RIGHT ANG/EXT/UNI/ OR  COMPARISON:  None.  FINDINGS: There is of arterial bypass graft to the popliteal artery below-the-knee which is patent. The popliteal artery at the knee and below-the-knee is patent. Anterior tibial artery is patent with focal significant disease in the proximal third. Peroneal artery is patent to the ankle. Posterior tibial artery is patent to the ankle.  IMPRESSION: Arterial bypass graft is patent with 3 vessel runoff.   Electronically Signed   By: Dalton  [name] M.D.   On: 11/07/2013 16:31   Dg Foot Complete Right  11/03/2013    CLINICAL DATA:  58-year-old male with foot ulcer at the dorsum of the medial right foot. Progressing. Swelling. Initial encounter.  EXAM: RIGHT FOOT COMPLETE - 3+ VIEW  COMPARISON:  None.  FINDINGS: Calcified atherosclerosis tracking into the right foot and continuing distally. Calcaneus intact. Bone mineralization is within normal limits. Degenerative changes at the first and third MTP joints. No acute fracture or dislocation identified. No osteolysis identified. No subcutaneous gas. No radiopaque foreign body identified.  IMPRESSION: 1. No acute osseous abnormality or plain radiographic evidence of osteomyelitis in the right foot. 2. Calcified peripheral vascular disease.   Electronically Signed   By: Dalton  [name] M.D.   On: 11/03/2013 17:13      Microbiology: Recent Results (from the past 240 hour(s))  Blood Cultures x 2 sites     Status: None   Collection Time: 11/03/13  6:19 PM  Result Value Ref Range Status   Specimen Description BLOOD FOREARM LEFT  Final   Special Requests BOTTLES DRAWN AEROBIC AND ANAEROBIC 5CC  Final   Culture  Setup Time   Final    11/04/2013 00:53 Performed at [facility]    Culture   Final    NO GROWTH 5 DAYS Performed at [facility]    Report Status 11/10/2013 FINAL  Final  Blood Cultures x 2 sites     Status: None   Collection Time: 11/03/13  6:25 PM  Result Value Ref Range Status   Specimen Description BLOOD FOREARM RIGHT  Final   Special Requests BOTTLES DRAWN AEROBIC AND ANAEROBIC 5CC  Final   Culture  Setup Time   Final    11/04/2013 00:53 Performed at [facility]    Culture   Final    NO GROWTH 5 DAYS Performed at [facility]    Report Status 11/10/2013 FINAL  Final  MRSA PCR Screening     Status: None   Collection Time: 11/04/13 10:15 PM  Result Value Ref Range Status   MRSA by PCR NEGATIVE NEGATIVE Final    Comment:        The GeneXpert MRSA Assay (FDA approved for NASAL specimens only), is one component  of a comprehensive MRSA colonization surveillance program. It is not intended to diagnose MRSA infection nor to guide or monitor treatment for MRSA infections.      Labs: Results for orders placed or performed during the hospital encounter of 11/03/13 (from the past 48 hour(s))  Glucose, capillary     Status: Abnormal   Collection Time: 11/10/13   12:12 PM  Result Value Ref Range   Glucose-Capillary 188 (H) 70 - 99 mg/dL  Glucose, capillary     Status: Abnormal   Collection Time: 11/10/13  4:13 PM  Result Value Ref Range   Glucose-Capillary 162 (H) 70 - 99 mg/dL  Glucose, capillary     Status: Abnormal   Collection Time: 11/10/13  9:25 PM  Result Value Ref Range   Glucose-Capillary 220 (H) 70 - 99 mg/dL   Comment 1 Documented in Chart    Comment 2 Notify RN   CBC     Status: Abnormal   Collection Time: 11/11/13  4:56 AM  Result Value Ref Range   WBC 6.1 4.0 - 10.5 K/uL   RBC 4.34 4.22 - 5.81 MIL/uL   Hemoglobin 12.2 (L) 13.0 - 17.0 g/dL   HCT 37.8 (L) 39.0 - 52.0 %   MCV 87.1 78.0 - 100.0 fL   MCH 28.1 26.0 - 34.0 pg   MCHC 32.3 30.0 - 36.0 g/dL   RDW 14.7 11.5 - 15.5 %   Platelets 165 150 - 400 K/uL  Basic metabolic panel     Status: Abnormal   Collection Time: 11/11/13  4:56 AM  Result Value Ref Range   Sodium 136 (L) 137 - 147 mEq/L   Potassium 3.6 (L) 3.7 - 5.3 mEq/L   Chloride 96 96 - 112 mEq/L   CO2 28 19 - 32 mEq/L   Glucose, Bld 160 (H) 70 - 99 mg/dL   BUN 11 6 - 23 mg/dL   Creatinine, Ser 0.96 0.50 - 1.35 mg/dL   Calcium 8.4 8.4 - 10.5 mg/dL   GFR calc non Af Amer 89 (L) >90 mL/min   GFR calc Af Amer >90 >90 mL/min    Comment: (NOTE) The eGFR has been calculated using the CKD EPI equation. This calculation has not been validated in all clinical situations. eGFR's persistently <90 mL/min signify possible Chronic Kidney Disease.    Anion gap 12 5 - 15  Glucose, capillary     Status: Abnormal   Collection Time: 11/11/13  5:50 AM  Result Value Ref Range    Glucose-Capillary 142 (H) 70 - 99 mg/dL   Comment 1 Documented in Chart    Comment 2 Notify RN   Glucose, capillary     Status: Abnormal   Collection Time: 11/11/13 11:14 AM  Result Value Ref Range   Glucose-Capillary 245 (H) 70 - 99 mg/dL   Comment 1 Notify RN    Comment 2 Documented in Chart   Glucose, capillary     Status: Abnormal   Collection Time: 11/11/13  4:45 PM  Result Value Ref Range   Glucose-Capillary 215 (H) 70 - 99 mg/dL   Comment 1 Notify RN    Comment 2 Documented in Chart   Glucose, capillary     Status: Abnormal   Collection Time: 11/11/13  8:49 PM  Result Value Ref Range   Glucose-Capillary 149 (H) 70 - 99 mg/dL  Basic metabolic panel     Status: Abnormal   Collection Time: 11/12/13  4:03 AM  Result Value Ref Range   Sodium 137 137 - 147 mEq/L   Potassium 3.8 3.7 - 5.3 mEq/L   Chloride 97 96 - 112 mEq/L   CO2 28 19 - 32 mEq/L   Glucose, Bld 214 (H) 70 - 99 mg/dL   BUN 12 6 - 23 mg/dL   Creatinine, Ser 0.84 0.50 - 1.35 mg/dL   Calcium 8.4 8.4 -  10.5 mg/dL   GFR calc non Af Amer >90 >90 mL/min   GFR calc Af Amer >90 >90 mL/min    Comment: (NOTE) The eGFR has been calculated using the CKD EPI equation. This calculation has not been validated in all clinical situations. eGFR's persistently <90 mL/min signify possible Chronic Kidney Disease.    Anion gap 12 5 - 15  Glucose, capillary     Status: Abnormal   Collection Time: 11/12/13  6:09 AM  Result Value Ref Range   Glucose-Capillary 225 (H) 70 - 99 mg/dL    [name] is a 58 y.o. male past medical history of hypertension hyperlipidemia PAD PVD ischemic cardiomyopathy, diabetes type 2 with a last hemoglobin A1c of greater than 12, peripheral neuropathy that comes in for a blister that started 7 days prior to admission. It later on progressed to right foot ulcer. He was admitted to medical service for IV antibiotics and wound care consulted. S/P right fem pop bypass  11/6  Assessment/Plan:  1. Diabetic right foot ulcer with cellulitis: Initially treated with IV antibiotics-vancomycin and Cipro. Wound care consultation obtained. Blood cultures negative thus far. Xray did not show osteomyelitis. Status post vascular surgery evaluation and right common femoral artery below knee popliteal artery bypass with non-reversed saphenous vein graft and intraoperative arteriogram on 11/6.  Improving. Deescalated Abx to PO Bactrim  Continue  For another 10 days . As per [facility], will need prolonged Abx d/t PAD, chronic venous insufficiency complicating diabetic foot ulcer and cellulitis. May eventually need right ray amputation of 1st toe. [facility] will also follow left lateral malleolus wound. OP follow up with [name] in 3 weeks. 2. PAD/critical RLE ischemia: After careful consideration & cardiology in put, patient underwent right fem pop bypass on 11/6. Mx per [facility]VS.  3. Ischemic cardiomyopathy/Chronic systolic CHF/CAD/AICD: Patient's antihypertensives and diuretics were held secondary to hypotension. As per cardiology, 2-D echo with LVEF 20-25 percent-stable from prior, nonischemic stress test in 7/14, ICD interrogation October 2015 showed no ventricular arrhythmias. Resumed Demadex & Aldactone at home dose d/t weight gain and increased leg edema. resume Metolazone at discharge. Cards resumed BB. Continue with aspirin, ACEI and statin. Strict i/o. Patient may need to follow-up with cardiology outpatient.. Received a dose of Lasix IV 40 mg 11/9 with good diuresis and patient states that he lost 6 lbs in 24 hours but still has excess fluid. . 4. Hypokalemia: replete and follow.  5. Diabetes Mellitus: started on Levemir and SSI- order hypoglycemics held initially. Discontinuing Novolin N relion and JARDIANCE  , continue Amaryl- hemoglobin A1c 12.4 suggesting poor OP control.patient sickly advised to monitor Accu-Cheks 4 times a day. Prescription for glucometer and glucose strips sent  to the patient's pharmacy. 6. Essential HTN: controlled. 7. History of hyperlipidemia: 8. History of tobacco abuse: Cessation counseled   Code Status: Full code   Consultants:  Wound care consult.   Vascular surgery  Cardiology  Procedures:  ABI  Aortogram with bilateral iliac arteriogram and bilateral lower extremity runoff on 11/5  RLE Fem pop bypass 11/6  Antibiotics:  Vancomycin 11/2>11/7  Ciprofloxacin 11/2>11/7  PO Bactrim 11/7>stop 11/20      Discharge Exam:   Blood pressure 115/76, pulse 85, temperature 98.3 F (36.8 C), temperature source Oral, resp. rate 18, height 6' (1.829 m), weight 93.94 kg (207 lb 1.6 oz), SpO2 97 %.         Discharge Instructions    Diet - low sodium heart healthy      Complete by:  As directed      Increase activity slowly    Complete by:  As directed            Follow-up Information    Follow up with [name], MD. Schedule an appointment as soon as possible for a visit in 1 week.   Specialty:  Family Medicine   Contact information:   [address] [place] [address] [phone]       Follow up with [name],CHRISTOPHER S, MD. Schedule an appointment as soon as possible for a visit in 3 weeks.   Specialty:  Vascular Surgery   Contact information:   [address] [place] [address] [phone]       Signed: ABROL,NAYANA 11/12/2013, 11:46 AM          

## 2013-11-12 NOTE — Progress Notes (Signed)
Patient discharged home. Discharge teaching completed with patient understanding. Patient left with his father and his daughter. All belongings sent home with pt.

## 2013-11-12 NOTE — Progress Notes (Signed)
   [facility]  Subjective  - Doing better day by day. Still edema, but I think it is going down.   Objective 94/60 76 98.3 F (36.8 C) (Oral) 18 97%  Intake/Output Summary (Last 24 hours) at 11/12/13 0741 Last data filed at 11/12/13 0648  Gross per 24 hour  Intake    720 ml  Output   2975 ml  Net  -2255 ml    Left PT doppler signal right DP signal Black eschar is decreasing on bilateral foot wounds.  Clean dressing with santyl applied. Right leg incisions healing well, groin healing well  Assessment/Planning: POD #5 Right FEMORAL-POPLITEAL ARTERY Bypass  Edema is improving in the right LE Ulcers are showing progress with decreased eschar bilateral feet Stable from a vascular stand point   Clinton GallantCOLLINS, Davinder Haff [facility] CenterMA[facility]EN 11/12/2013 7:41 AM --  Laboratory Lab Results:  Recent Labs  11/11/13 0456  WBC 6.1  HGB 12.2*  HCT 37.8*  PLT 165   BMET  Recent Labs  11/11/13 0456 11/12/13 0403  NA 136* 137  K 3.6* 3.8  CL 96 97  CO2 28 28  GLUCOSE 160* 214*  BUN 11 12  CREATININE 0.96 0.84  CALCIUM 8.4 8.4    COAG No results found for: INR, PROTIME No results found for: PTT

## 2013-11-12 NOTE — Progress Notes (Signed)
Dressing changed again to right foot. Dressing was to thick. Patient was not able to get his shoe on.

## 2013-11-12 NOTE — Discharge Instructions (Addendum)
Follow-up with dr. Edilia Bodickson on 12-03-13 at 10:30 am Recommend  Glucose monitoring 4 times a day, before meals and at bedtime

## 2013-12-02 ENCOUNTER — Other Ambulatory Visit: Payer: Self-pay | Admitting: *Deleted

## 2013-12-02 ENCOUNTER — Encounter: Payer: Self-pay | Admitting: Vascular Surgery

## 2013-12-02 MED ORDER — BENAZEPRIL HCL 5 MG PO TABS: 5.0000 mg | ORAL_TABLET | Freq: Every day | ORAL | Status: DC

## 2013-12-03 ENCOUNTER — Ambulatory Visit (INDEPENDENT_AMBULATORY_CARE_PROVIDER_SITE_OTHER): Payer: Self-pay | Admitting: Vascular Surgery

## 2013-12-03 ENCOUNTER — Encounter: Payer: Self-pay | Admitting: Vascular Surgery

## 2013-12-03 VITALS — BP 106/71 | HR 95 | Temp 98.5°F | Ht 72.0 in | Wt 198.0 lb

## 2013-12-03 DIAGNOSIS — Z48812 Encounter for surgical aftercare following surgery on the circulatory system: Secondary | ICD-10-CM

## 2013-12-03 MED ORDER — CEPHALEXIN 500 MG PO CAPS: 500.0000 mg | ORAL_CAPSULE | Freq: Three times a day (TID) | ORAL | Status: DC

## 2013-12-03 NOTE — Progress Notes (Signed)
   Patient name: Dalton RainwaterLarry D [name] MRN: [id number] DOB: [birth year] Sex: male  REASON FOR VISIT: Follow up after right femoropopliteal bypass.  HPI: Dalton RainwaterLarry D Life is a 58 y.o. male who had presented with an extensive nonhealing wound of the right foot. It was felt that his only chance for limb salvage was attempted revascularization. On 11/07/2013, he underwent right common femoral artery to below knee popliteal artery bypass with a vein graft. He comes in for a 3 week follow up visit.  He has been doing the dressing change on his right foot himself. He denies fever or chills.  REVIEW OF SYSTEMS: Arly.Keller[X ] denotes positive finding; [  ] denotes negative finding  CARDIOVASCULAR:  [ ]  chest pain   [ ]  dyspnea on exertion    CONSTITUTIONAL:  [ ]  fever   [ ]  chills  PHYSICAL EXAM: Filed Vitals:   12/03/13 1114  BP: 106/71  Pulse: 95  Temp: 98.5 F (36.9 C)  TempSrc: Oral  Height: 6' (1.829 m)  Weight: 198 lb (89.812 kg)  SpO2: 100%   Body mass index is 26.85 kg/(m^2). GENERAL: The patient is a well-nourished male, in no acute distress. The vital signs are documented above. CARDIOVASCULAR: There is a regular rate and rhythm. PULMONARY: There is good air exchange bilaterally without wheezing or rales. His incisions have all healed nicely. He has a biphasic posterior tibial signal on the right foot and a monophasic dorsalis pedis signal. He has an extensive wound on the medial aspect of his right foot. There is some cellulitis.  MEDICAL ISSUES: The patient is doing well status post right femoropopliteal bypass graft. He has a biphasic posterior tibial signal. However, he has a significant wound on the right foot and I have explained to him that he is not yet out of the woods. I have written him a prescription for Keflex. He will continue to soak the foot daily in lukewarm dye soap soaks. He will continue with the dressing changes. I will see him back in 3-4 weeks. He understands that he could  potentially require further work on the right foot. We have again discussed the importance of tobacco cessation.  Madoc Holquin S [facility]: [phone]

## 2013-12-08 ENCOUNTER — Encounter: Payer: Self-pay | Admitting: *Deleted

## 2013-12-11 ENCOUNTER — Encounter (HOSPITAL_COMMUNITY): Payer: Self-pay | Admitting: Internal Medicine

## 2013-12-30 ENCOUNTER — Encounter: Payer: Self-pay | Admitting: Vascular Surgery

## 2013-12-31 ENCOUNTER — Encounter: Payer: Self-pay | Admitting: Vascular Surgery

## 2013-12-31 ENCOUNTER — Ambulatory Visit (INDEPENDENT_AMBULATORY_CARE_PROVIDER_SITE_OTHER): Payer: Self-pay | Admitting: Vascular Surgery

## 2013-12-31 VITALS — BP 98/63 | HR 71 | Resp 16 | Ht 72.0 in | Wt 198.0 lb

## 2013-12-31 DIAGNOSIS — Z48812 Encounter for surgical aftercare following surgery on the circulatory system: Secondary | ICD-10-CM

## 2013-12-31 NOTE — Progress Notes (Signed)
   Patient name: Dalton RainwaterLarry D [name] MRN: [id number] DOB: 1955/02/22 Sex: male  REASON FOR VISIT: Follow up after right femoropopliteal bypass  HPI: Dalton RainwaterLarry D [name] is a 58 y.o. male who had presented with an extensive nonhealing wound of his right foot. He underwent a right common femoral artery to below knee popliteal artery bypass with a vein graft on 11/07/2013. He comes in for a follow up visit. His wounds on the right foot are gradually improving. He has been soaking his foot in luke warm Dial soap soaks. He denies fever or chills. Given the extent of the wounds I did not think that he can return to work at this point.  REVIEW OF SYSTEMS: Arly.Keller[X ] denotes positive finding; [  ] denotes negative finding  CARDIOVASCULAR:  [ ]  chest pain   [ ]  dyspnea on exertion    CONSTITUTIONAL:  [ ]  fever   [ ]  chills  PHYSICAL EXAM: Filed Vitals:   12/31/13 1403  BP: 98/63  Pulse: 71  Resp: 16  Height: 6' (1.829 m)  Weight: 198 lb (89.812 kg)   Body mass index is 26.85 kg/(m^2). GENERAL: The patient is a well-nourished male, in no acute distress. The vital signs are documented above. CARDIOVASCULAR: There is a regular rate and rhythm. PULMONARY: There is good air exchange bilaterally without wheezing or rales. His incisions in the right leg are healing nicely. He has a palpable right popliteal pulse. The wounds adjacent to the right great toe measured 3 cm x 2 cm and 2 cm x 1 cm.  MEDICAL ISSUES: Patient is doing well status post right femoropopliteal bypass graft. He will continue with the local wound care to the foot wounds and I do not think it safe to return to work until these have healed. We have again had a long discussion about the importance of tobacco cessation and how this affects wound healing. I'll see him back in 2 weeks.  Wendle Kina S [facility]  [facility]: [phone]

## 2014-01-08 ENCOUNTER — Encounter: Payer: Self-pay | Admitting: Internal Medicine

## 2014-01-08 ENCOUNTER — Ambulatory Visit (INDEPENDENT_AMBULATORY_CARE_PROVIDER_SITE_OTHER): Payer: BLUE CROSS/BLUE SHIELD | Admitting: Internal Medicine

## 2014-01-08 VITALS — BP 72/52 | HR 76 | Ht 72.0 in | Wt 208.2 lb

## 2014-01-08 DIAGNOSIS — Z4502 Encounter for adjustment and management of automatic implantable cardiac defibrillator: Secondary | ICD-10-CM | POA: Diagnosis not present

## 2014-01-08 DIAGNOSIS — Z95810 Presence of automatic (implantable) cardiac defibrillator: Secondary | ICD-10-CM | POA: Diagnosis not present

## 2014-01-08 DIAGNOSIS — I5022 Chronic systolic (congestive) heart failure: Secondary | ICD-10-CM | POA: Diagnosis not present

## 2014-01-08 DIAGNOSIS — I255 Ischemic cardiomyopathy: Secondary | ICD-10-CM

## 2014-01-08 LAB — MDC_IDC_ENUM_SESS_TYPE_INCLINIC
Battery Remaining Longevity: 91.2 mo
Brady Statistic RV Percent Paced: 0 %
HIGH POWER IMPEDANCE MEASURED VALUE: 49.7634
Lead Channel Pacing Threshold Amplitude: 0.75 V
Lead Channel Pacing Threshold Pulse Width: 0.5 ms
Lead Channel Setting Pacing Pulse Width: 0.5 ms
Lead Channel Setting Sensing Sensitivity: 0.5 mV
MDC IDC MSMT LEADCHNL RV IMPEDANCE VALUE: 412.5 Ohm
MDC IDC MSMT LEADCHNL RV SENSING INTR AMPL: 11.7 mV
MDC IDC PG SERIAL: 7155171
MDC IDC SESS DTM: 20160107104309
MDC IDC SET LEADCHNL RV PACING AMPLITUDE: 2.5 V
Zone Setting Detection Interval: 250 ms
Zone Setting Detection Interval: 300 ms

## 2014-01-08 MED ORDER — BENAZEPRIL HCL 5 MG PO TABS: 2.5000 mg | ORAL_TABLET | Freq: Every day | ORAL | Status: DC

## 2014-01-08 MED ORDER — EZETIMIBE 10 MG PO TABS: 10.0000 mg | ORAL_TABLET | Freq: Every day | ORAL | Status: DC

## 2014-01-08 NOTE — Progress Notes (Signed)
Patient Care Team: Kathyrn Lass, MD as PCP - General (Family Medicine)   HPI  [name] is a 59 y.o. male Seen in followup for ischemic heart disease with prior stenting of LAD/RCA and LCx with last Cath 2006. He has a previously implanted ICD for which she underwent generator replacement 12/14 .  He has known peripheral vascular disease with ABIs in remote past and carotid Dopplers in our office a year ago with 60-79% bilaterally.   He was recently hosp for chf  EF 25 %;Last myoview was in 07/2012 with large, severe, fixed anterior, septal, inferior and apical infarct; no ischemia, EF 26%     He is noted this morning to have hypotension. Review of blood pressures over the last 4 months demonstrates that they have been in the-110 range. Up titration of ACE inhibitors and beta blockers in the past has been limited by dizziness  He has had nonhealing ulcers of his right foot. He was hospitalized and underwent revascularization. He has had some problems with edema. He denies chest pain.      Past Medical History  Diagnosis Date  . Hyperlipidemia     mixed  . Chronic systolic heart failure   . ICD (implantable cardiac defibrillator), dual, in situ     St. Jude for severe LVD EF 25% 2/07 explanted 2010. Medtronic Virtuoso II DR Dual-chamber cardiverter-defibrillator  with pocket revision, Dr. Caryl Comes  . Tobacco abuse   . Ischemic cardiomyopathy     a. Echo (09/27/12): EF 20%, diffuse HK with periapical AK, no LV thrombus noted, restrictive physiology, trivial MR, mild LAE, RVSF mildly reduced, PASP 39  . Cerebrovascular disease   . Diabetes mellitus   . HTN (hypertension)   . PVD (peripheral vascular disease)     a. ABI 09/2011 - R normal, L moderate - saw Dr. Fletcher Anon - med rx.   . Carotid artery disease     a. Dopp 09/2011: 92-11% RICA, 94-17% LICA.;  b.  Carotid US (7/14):  Bilateral 60-79% => f/u 6 mos  . Coronary artery disease     a. AWMI requiring IABP 2005 s/p Horizon study  stent-LAD, staged BMS-Cx, DESx2-RCA. b. Last Last LHC (6/06):  EF 25%, pLAD stent 40-50, after stent 40, dLAD 20, pCFX 20, pOM1 70, pRCA 20, mRCA stents ok.=> med Rx;  c.  Myoview (7/14):  EF 26%, large ant, septal, inf and apical infarct, no ischemia.  Med Rx continued  . RIATA ICD Lead--on advisory recall      Past Surgical History  Procedure Laterality Date  . Medtronic virtuoso ii dr dual-chamber cardioverter-defibrillation with pocket revison      Dr. Virl Axe  . Femoral-popliteal bypass graft Right 11/07/2013    Procedure: Right FEMORAL-POPLITEAL ARTERY Bypass ;  Surgeon: Angelia Mould, MD;  Location: [facility];  Service: Vascular;  Laterality: Right;  . Intraoperative arteriogram Right 11/07/2013    Procedure: INTRA OPERATIVE ARTERIOGRAM;  Surgeon: Angelia Mould, MD;  Location: [place] [facility];  Service: Vascular;  Laterality: Right;  . Implantable cardioverter defibrillator (icd) generator change N/A 12/16/2012    Procedure: ICD GENERATOR CHANGE;  Surgeon: Deboraha Sprang, MD;  Location: [facility] CATH LAB;  Service: Cardiovascular;  Laterality: N/A;  . Abdominal aortagram N/A 11/06/2013    Procedure: ABDOMINAL AORTAGRAM;  Surgeon: Angelia Mould, MD;  Location: [facility] CATH LAB;  Service: Cardiovascular;  Laterality: N/A;  . Lower extremity angiogram Bilateral 11/06/2013    Procedure: LOWER EXTREMITY ANGIOGRAM;  Surgeon: Harrell Gave  Nicole Cella, MD;  Location[facility] CATH LAB;  Service: Cardiovascular;  Laterality: Bilateral;    Current Outpatient Prescriptions  Medication Sig Dispense Refill  . aspirin 81 MG tablet Take 1 tablet (81 mg total) by mouth daily. 30 tablet 3  . atorvastatin (LIPITOR) 80 MG tablet Take 80 mg by mouth every other day.     . benazepril (LOTENSIN) 5 MG tablet Take 1 tablet (5 mg total) by mouth daily. 30 tablet 5  . carvedilol (COREG) 6.25 MG tablet Take 1 tablet (6.25 mg total) by mouth 2 (two) times daily with a meal. 60 tablet 2  . cephALEXin (KEFLEX) 500 MG  capsule Take 1 capsule (500 mg total) by mouth 3 (three) times daily. 42 capsule 1  . ezetimibe (ZETIA) 10 MG tablet Take 10 mg by mouth daily.    Marland Kitchen glucose blood test strip Use as instructed 100 each 12  . glucose monitoring kit (FREESTYLE) monitoring kit 1 each by Does not apply route as needed for other. 1 each 0  . insulin aspart (NOVOLOG) 100 UNIT/ML injection Inject 3 Units into the skin 3 (three) times daily with meals. 10 mL 11  . insulin detemir (LEVEMIR) 100 UNIT/ML injection Inject 0.18 mLs (18 Units total) into the skin at bedtime. 10 mL 11  . insulin NPH-regular Human (NOVOLIN 70/30) (70-30) 100 UNIT/ML injection Inject 16 Units into the skin 2 (two) times daily with a meal.    . loratadine (CLARITIN) 10 MG tablet Take 10 mg by mouth daily as needed for allergies.     . metolazone (ZAROXOLYN) 2.5 MG tablet Take 1 tablet (2.5 mg total) by mouth See admin instructions. Take 2.5 mg daily for 2 consecutive days (two tablets per week) if needed for excess fluid when LASIX does not work. 30 tablet 2  . polyethylene glycol (MIRALAX / GLYCOLAX) packet Take 17 g by mouth daily as needed for mild constipation.     . potassium chloride (K-DUR,KLOR-CON) 10 MEQ tablet as needed.    . senna-docusate (SENOKOT-S) 8.6-50 MG per tablet Take 1 tablet by mouth at bedtime as needed for mild constipation. 30 tablet 2  . spironolactone (ALDACTONE) 25 MG tablet Take 1 tablet (25 mg total) by mouth daily. 30 tablet 6  . torsemide (DEMADEX) 20 MG tablet Take 60 mg by mouth daily.      No current facility-administered medications for this visit.    Allergies  Allergen Reactions  . Niacin Itching and Other (See Comments)    Flushing     Review of Systems negative except from HPI and PMH  Physical Exam BP 72/52 mmHg  Pulse 76  Ht 6' (1.829 m)  Wt 208 lb 3.2 oz (94.439 kg)  BMI 28.23 kg/m2 Well developed and nourished in no acute distress HENT normal Neck supple with 7-8 cmr Clear Regular rate and  rhythm,+s4 with 2/6 murmu Abd-soft with active BS No Clubbing cyanosis 1+ edema Skin-warm and dry A & Oriented  Grossly normal sensory and motor function    Assessment and  Plan  Hypotension   ischemic cardiomyopathy   implantable defibrillator St. Jude The patient's device was interrogated.  The information was reviewed. No changes were made in the programming.    Nonhealing ulcers.  There is no evidence of sepsis to length the ulcers with the hypotension. Looking through his chart the blood pressure issues have been worsening over the last 3-4 months; it also occurred while in hospital. We will for now reduce his spironolactone,  his ACE inhibitor and his beta blocker by 50%. The greater driving pressure will facilitate healing I would anticipate.

## 2014-01-08 NOTE — Patient Instructions (Signed)
Your physician has recommended you make the following change in your medication:  1) DECREASE Benazepril to 2.5 mg daily 2) DECREASE Carvedilol to 3.125 mg twice daily 3) DECREASE Spironolactone to 12.5 mg daily  Your physician recommends that you schedule a follow-up appointment in: 3 months with Dr. Graciela HusbandsKlein

## 2014-01-09 ENCOUNTER — Encounter: Payer: Self-pay | Admitting: Internal Medicine

## 2014-01-11 NOTE — Progress Notes (Signed)
HPI: FU CAD and CHF. S/p ant MI in 2005 req IABP tx with Horizon study stent to the LAD followed by staged BMS to the CFX and DES x 2 to RCA, Ischemic CM, systolic CHF, s/p AICD, HTN, DM2, HL, carotid stenosis, PAD. Last LHC (6/06): EF 25%, pLAD stent 40-50, after stent 40, dLAD 20, pCFX 20, pOM1 70, pRCA 20, mRCA stents ok. Abdominal US (2/11): No AAA. Myoview (7/14): EF 26%, large ant, septal, inf and apical infarct, no ischemia. Med Rx continued. Carotid US (7/15): Bilateral 60-79% => f/u 12 mos. Echocardiogram 5/15 showed an ejection fraction of 14-43%, grade 2 diastolic dysfunction; moderate LAE, mild RAE, moderate MR. There was restrictive physiology. He also is followed now in congestive heart failure clinic. Had right fem-pop 11/15. Since last seen, he has some dyspnea on exertion but no chest pain, palpitations, syncope or increased pedal edema.  Current Outpatient Prescriptions  Medication Sig Dispense Refill  . aspirin 81 MG tablet Take 1 tablet (81 mg total) by mouth daily. 30 tablet 3  . atorvastatin (LIPITOR) 80 MG tablet Take 80 mg by mouth every other day.     . benazepril (LOTENSIN) 5 MG tablet Take 0.5 tablets (2.5 mg total) by mouth daily. 30 tablet 5  . carvedilol (COREG) 6.25 MG tablet Take 1 tablet (6.25 mg total) by mouth 2 (two) times daily with a meal. (Patient taking differently: Take 3.125 mg by mouth 2 (two) times daily with a meal. ) 60 tablet 2  . cephALEXin (KEFLEX) 500 MG capsule Take 1 capsule (500 mg total) by mouth 3 (three) times daily. 42 capsule 1  . ezetimibe (ZETIA) 10 MG tablet Take 1 tablet (10 mg total) by mouth daily. 30 tablet 6  . glucose blood test strip Use as instructed 100 each 12  . glucose monitoring kit (FREESTYLE) monitoring kit 1 each by Does not apply route as needed for other. 1 each 0  . insulin detemir (LEVEMIR) 100 UNIT/ML injection Inject 0.18 mLs (18 Units total) into the skin at bedtime. 10 mL 11  . insulin NPH-regular Human  (NOVOLIN 70/30) (70-30) 100 UNIT/ML injection Inject 16 Units into the skin 2 (two) times daily with a meal.    . loratadine (CLARITIN) 10 MG tablet Take 10 mg by mouth daily as needed for allergies.     . metolazone (ZAROXOLYN) 2.5 MG tablet Take 1 tablet (2.5 mg total) by mouth See admin instructions. Take 2.5 mg daily for 2 consecutive days (two tablets per week) if needed for excess fluid when LASIX does not work. 30 tablet 2  . polyethylene glycol (MIRALAX / GLYCOLAX) packet Take 17 g by mouth daily as needed for mild constipation.     . potassium chloride (K-DUR,KLOR-CON) 10 MEQ tablet as needed.    . senna-docusate (SENOKOT-S) 8.6-50 MG per tablet Take 1 tablet by mouth at bedtime as needed for mild constipation. 30 tablet 2  . spironolactone (ALDACTONE) 25 MG tablet Take 1 tablet (25 mg total) by mouth daily. (Patient taking differently: Take 12.5 mg by mouth daily. ) 30 tablet 6  . torsemide (DEMADEX) 20 MG tablet Take 60 mg by mouth daily.      No current facility-administered medications for this visit.     Past Medical History  Diagnosis Date  . Hyperlipidemia     mixed  . Chronic systolic heart failure   . ICD (implantable cardiac defibrillator), dual, in situ     St. Jude for  severe LVD EF 25% 2/07 explanted 2010. Medtronic Virtuoso II DR Dual-chamber cardiverter-defibrillator  with pocket revision, Dr. Caryl Comes  . Tobacco abuse   . Ischemic cardiomyopathy     a. Echo (09/27/12): EF 20%, diffuse HK with periapical AK, no LV thrombus noted, restrictive physiology, trivial MR, mild LAE, RVSF mildly reduced, PASP 39  . Cerebrovascular disease   . Diabetes mellitus   . HTN (hypertension)   . PVD (peripheral vascular disease)     a. ABI 09/2011 - R normal, L moderate - saw Dr. Fletcher Anon - med rx.   . Carotid artery disease     a. Dopp 09/2011: 72-62% RICA, 03-55% LICA.;  b.  Carotid US (7/14):  Bilateral 60-79% => f/u 6 mos  . Coronary artery disease     a. AWMI requiring IABP 2005 s/p  Horizon study stent-LAD, staged BMS-Cx, DESx2-RCA. b. Last Last LHC (6/06):  EF 25%, pLAD stent 40-50, after stent 40, dLAD 20, pCFX 20, pOM1 70, pRCA 20, mRCA stents ok.=> med Rx;  c.  Myoview (7/14):  EF 26%, large ant, septal, inf and apical infarct, no ischemia.  Med Rx continued  . RIATA ICD Lead--on advisory recall      Past Surgical History  Procedure Laterality Date  . Medtronic virtuoso ii dr dual-chamber cardioverter-defibrillation with pocket revison      Dr. Virl Axe  . Femoral-popliteal bypass graft Right 11/07/2013    Procedure: Right FEMORAL-POPLITEAL ARTERY Bypass ;  Surgeon: Angelia Mould, MD;  Location: [place];  Service: Vascular;  Laterality: Right;  . Intraoperative arteriogram Right 11/07/2013    Procedure: INTRA OPERATIVE ARTERIOGRAM;  Surgeon: Angelia Mould, MD;  Location: [place];  Service: Vascular;  Laterality: Right;  . Implantable cardioverter defibrillator (icd) generator change N/A 12/16/2012    Procedure: ICD GENERATOR CHANGE;  Surgeon: Deboraha Sprang, MD;  Location: [facility] CATH LAB;  Service: Cardiovascular;  Laterality: N/A;  . Abdominal aortagram N/A 11/06/2013    Procedure: ABDOMINAL AORTAGRAM;  Surgeon: Angelia Mould, MD;  Location: [facility] CATH LAB;  Service: Cardiovascular;  Laterality: N/A;  . Lower extremity angiogram Bilateral 11/06/2013    Procedure: LOWER EXTREMITY ANGIOGRAM;  Surgeon: Angelia Mould, MD;  Location: [facility] CATH LAB;  Service: Cardiovascular;  Laterality: Bilateral;    History   Social History  . Marital Status: Single    Spouse Name: N/A    Number of Children: N/A  . Years of Education: N/A   Occupational History  . [employer]    Full time   Social History Main Topics  . Smoking status: Current Some Day Smoker -- 1.00 packs/day for 33 years    Types: Cigarettes  . Smokeless tobacco: Never Used     Comment: smoker for 33 years  . Alcohol Use: No  . Drug Use: No     Comment: former Cocain, Acid,  Marijuana - 25 years ago  . Sexual Activity: No   Other Topics Concern  . Not on file   Social History Narrative   Divorced.    ROS: no fevers or chills, productive cough, hemoptysis, dysphasia, odynophagia, melena, hematochezia, dysuria, hematuria, rash, seizure activity, orthopnea, PND, pedal edema, claudication. Remaining systems are negative.  Physical Exam: Well-developed well-nourished in no acute distress.  Skin is warm and dry.  HEENT is normal.  Neck is supple.  Chest is clear to auscultation with normal expansion.  Cardiovascular exam is regular rate and rhythm.  Abdominal exam nontender or distended. No masses  palpated. Extremities show no edema. He has ulcers on his feet bilaterally. neuro grossly intact  ECG normal sinus rhythm, left atrial enlargement, prior anterior lateral infarct

## 2014-01-12 ENCOUNTER — Ambulatory Visit (INDEPENDENT_AMBULATORY_CARE_PROVIDER_SITE_OTHER): Payer: BLUE CROSS/BLUE SHIELD | Admitting: Cardiology

## 2014-01-12 ENCOUNTER — Encounter: Payer: Self-pay | Admitting: Cardiology

## 2014-01-12 VITALS — BP 98/70 | HR 87 | Ht 72.0 in | Wt 187.1 lb

## 2014-01-12 DIAGNOSIS — Z720 Tobacco use: Secondary | ICD-10-CM

## 2014-01-12 DIAGNOSIS — F17200 Nicotine dependence, unspecified, uncomplicated: Secondary | ICD-10-CM

## 2014-01-12 DIAGNOSIS — E785 Hyperlipidemia, unspecified: Secondary | ICD-10-CM

## 2014-01-12 DIAGNOSIS — I5022 Chronic systolic (congestive) heart failure: Secondary | ICD-10-CM

## 2014-01-12 DIAGNOSIS — I679 Cerebrovascular disease, unspecified: Secondary | ICD-10-CM

## 2014-01-12 DIAGNOSIS — I2510 Atherosclerotic heart disease of native coronary artery without angina pectoris: Secondary | ICD-10-CM

## 2014-01-12 DIAGNOSIS — I10 Essential (primary) hypertension: Secondary | ICD-10-CM

## 2014-01-12 DIAGNOSIS — I739 Peripheral vascular disease, unspecified: Secondary | ICD-10-CM

## 2014-01-12 LAB — BASIC METABOLIC PANEL WITH GFR
BUN: 21 mg/dL (ref 6–23)
CO2: 32 mEq/L (ref 19–32)
Calcium: 9.7 mg/dL (ref 8.4–10.5)
Chloride: 95 mEq/L — ABNORMAL LOW (ref 96–112)
Creat: 0.8 mg/dL (ref 0.50–1.35)
GFR, Est Non African American: >89 mL/min
GLUCOSE: 172 mg/dL — AB (ref 70–99)
POTASSIUM: 4.9 meq/L (ref 3.5–5.3)
Sodium: 139 mEq/L (ref 135–145)

## 2014-01-12 LAB — HEPATIC FUNCTION PANEL
ALBUMIN: 4 g/dL (ref 3.5–5.2)
ALT: 67 U/L — ABNORMAL HIGH (ref 0–53)
AST: 27 U/L (ref 0–37)
Alkaline Phosphatase: 188 U/L — ABNORMAL HIGH (ref 39–117)
BILIRUBIN DIRECT: 0.3 mg/dL (ref 0.0–0.3)
BILIRUBIN INDIRECT: 0.7 mg/dL (ref 0.2–1.2)
Total Bilirubin: 1 mg/dL (ref 0.2–1.2)
Total Protein: 8.2 g/dL (ref 6.0–8.3)

## 2014-01-12 LAB — LIPID PANEL
CHOLESTEROL: 110 mg/dL (ref 0–200)
HDL: 47 mg/dL (ref >39)
LDL CALC: 53 mg/dL (ref 0–99)
TRIGLYCERIDES: 52 mg/dL (ref <150)
Total CHOL/HDL Ratio: 2.3 Ratio
VLDL: 10 mg/dL (ref 0–40)

## 2014-01-12 NOTE — Assessment & Plan Note (Signed)
Followed by electrophysiology. 

## 2014-01-12 NOTE — Assessment & Plan Note (Signed)
euvolemic on examination. Continue present dose of diuretics. Check potassium and renal function. We discussed weighing daily with additional Zaroxolyn as needed. He will follow a low-sodium diet.

## 2014-01-12 NOTE — Assessment & Plan Note (Signed)
Continue aspirin and statin. Followed by vascular surgery. 

## 2014-01-12 NOTE — Assessment & Plan Note (Signed)
Followup carotid Dopplers July 2016.  

## 2014-01-12 NOTE — Assessment & Plan Note (Signed)
Blood pressure controlled. Continue present medications. 

## 2014-01-12 NOTE — Assessment & Plan Note (Signed)
Continue aspirin and statin. 

## 2014-01-12 NOTE — Assessment & Plan Note (Signed)
Patient counseled on discontinuing. 

## 2014-01-12 NOTE — Patient Instructions (Signed)
Your physician wants you to follow-up in: 3 MONTHS WITH DR CRENSHAW You will receive a reminder letter in the mail two months in advance. If you don't receive a letter, please call our office to schedule the follow-up appointment.   Your physician recommends that you HAVE LAB WORK TODAY 

## 2014-01-12 NOTE — Assessment & Plan Note (Signed)
Continue statin and check lipids and liver.

## 2014-01-12 NOTE — Assessment & Plan Note (Signed)
Continue ACE inhibitor and beta blocker. 

## 2014-01-13 ENCOUNTER — Encounter: Payer: Self-pay | Admitting: Vascular Surgery

## 2014-01-14 ENCOUNTER — Encounter: Payer: Self-pay | Admitting: Vascular Surgery

## 2014-01-14 ENCOUNTER — Other Ambulatory Visit: Payer: Self-pay | Admitting: *Deleted

## 2014-01-14 ENCOUNTER — Ambulatory Visit (INDEPENDENT_AMBULATORY_CARE_PROVIDER_SITE_OTHER): Payer: Self-pay | Admitting: Vascular Surgery

## 2014-01-14 VITALS — BP 113/76 | HR 88 | Temp 98.3°F | Ht 72.0 in | Wt 187.7 lb

## 2014-01-14 DIAGNOSIS — R945 Abnormal results of liver function studies: Secondary | ICD-10-CM

## 2014-01-14 DIAGNOSIS — Z48812 Encounter for surgical aftercare following surgery on the circulatory system: Secondary | ICD-10-CM

## 2014-01-14 NOTE — Progress Notes (Signed)
   Patient name: Dalton RainwaterLarry D Klosowski MRN: [id number] DOB: May 23, 1955 Sex: male  REASON FOR VISIT: Follow up after right lower extremity bypass  HPI: [name] is a 59 y.o. male had presented with extensive nonhealing wound of his right foot. He underwent a right common femoral artery to below knee popliteal artery with a vein graft in November 2015. At the time of his last visit on 12/31/2013, the wounds on the right foot were gradually improving. He was instructed to continue soaking the foot daily in lukewarm dial soap soaks. We also discussed the importance of tobacco cessation at that time.  He is doing well. He is not back at work yet. He has been soaking the foot daily and keeping a nice dressing on the wounds. He denies fever or chills. There has not been significant drainage.   REVIEW OF SYSTEMS: Arly.Keller[X ] denotes positive finding; [  ] denotes negative finding  CARDIOVASCULAR:  [ ]  chest pain   [ ]  dyspnea on exertion    CONSTITUTIONAL:  [ ]  fever   [ ]  chills  PHYSICAL EXAM: Filed Vitals:   01/14/14 1051  BP: 113/76  Pulse: 88  Temp: 98.3 F (36.8 C)  TempSrc: Oral  Height: 6' (1.829 m)  Weight: 187 lb 11.2 oz (85.14 kg)  SpO2: 100%   Body mass index is 25.45 kg/(m^2). GENERAL: The patient is a well-nourished male, in no acute distress. The vital signs are documented above. CARDIOVASCULAR: There is a regular rate and rhythm. PULMONARY: There is good air exchange bilaterally without wheezing or rales. He has a palpable right femoral and right popliteal pulse. The right foot is warm. The wound on the medial aspect adjacent to the first metatarsal measures 2 cm in diameter. The wound on the dorsum of the foot proximal to this measures 15 mm x 12 mm. Thus both of these wounds are slightly smaller.  MEDICAL ISSUES: The patient has a functioning bypass graft in the right leg and the wounds on the foot do appear to be gradually healing. I instructed him to continue to soak the foot daily  in lukewarm dial soap soaks and keep a nice dry clean dressing on the wound. I plan on seeing him back in 1 month. We'll try to let him go back to work and see how this goes. If the wound stops making progress we would have to get him off his feet again. He will be due for a postop follow up study around the time of his next visit.  DICKSON,CHRISTOPHER S [facility] [place] Beeper: [phone]

## 2014-02-05 ENCOUNTER — Other Ambulatory Visit: Payer: Self-pay | Admitting: Vascular Surgery

## 2014-02-05 ENCOUNTER — Other Ambulatory Visit (HOSPITAL_COMMUNITY): Payer: Self-pay | Admitting: *Deleted

## 2014-02-05 MED ORDER — SPIRONOLACTONE 25 MG PO TABS: 12.5000 mg | ORAL_TABLET | Freq: Every day | ORAL | Status: DC

## 2014-02-05 MED ORDER — TORSEMIDE 20 MG PO TABS: 60.0000 mg | ORAL_TABLET | Freq: Every day | ORAL | Status: DC

## 2014-02-17 ENCOUNTER — Encounter: Payer: Self-pay | Admitting: Vascular Surgery

## 2014-02-18 ENCOUNTER — Encounter: Payer: Self-pay | Admitting: Vascular Surgery

## 2014-02-18 ENCOUNTER — Ambulatory Visit (INDEPENDENT_AMBULATORY_CARE_PROVIDER_SITE_OTHER): Payer: Self-pay | Admitting: Vascular Surgery

## 2014-02-18 VITALS — BP 106/66 | HR 97 | Temp 98.4°F | Ht 72.0 in | Wt 191.2 lb

## 2014-02-18 DIAGNOSIS — I739 Peripheral vascular disease, unspecified: Secondary | ICD-10-CM

## 2014-02-18 DIAGNOSIS — Z48812 Encounter for surgical aftercare following surgery on the circulatory system: Secondary | ICD-10-CM

## 2014-02-18 NOTE — Addendum Note (Signed)
Addended by: Adria DillELDRIDGE-LEWIS, Katilynn Sinkler L on: 02/18/2014 03:03 PM   Modules accepted: Orders

## 2014-02-18 NOTE — Progress Notes (Signed)
[facility]  Patient name: [name] MRN: [id number] DOB: [birth year] Sex: male  REASON FOR VISIT: Follow up after right femoral to below knee popliteal artery bypass  HPI: [name] is a 59 y.o. male who had presented with an extensive nonhealing wound of his right foot. He underwent a right common femoral artery to below knee popliteal artery bypass with a vein graft on 11/07/2013. I last saw him on 12/31/2013. At that time, the incisions in the right leg were healing nicely and he had a palpable right popliteal pulse. The wound adjacent to the right great toe measured 3 cm x 2 cm and 2 cm x 1 cm. He was to continue soaking the foot daily and we also again discussed the importance of tobacco cessation. He comes in for a follow up visit.  Since I saw him last, he has been soaking the foot daily and taking good care of his wounds. He does continue to smoke. He has no rest pain or significant claudication.    Past Medical History  Diagnosis Date  . Hyperlipidemia     mixed  . Chronic systolic heart failure   . ICD (implantable cardiac defibrillator), dual, in situ     St. Jude for severe LVD EF 25% 2/07 explanted 2010. Medtronic Virtuoso II DR Dual-chamber cardiverter-defibrillator  with pocket revision, Dr. Caryl Comes  . Tobacco abuse   . Ischemic cardiomyopathy     a. Echo (09/27/12): EF 20%, diffuse HK with periapical AK, no LV thrombus noted, restrictive physiology, trivial MR, mild LAE, RVSF mildly reduced, PASP 39  . Cerebrovascular disease   . Diabetes mellitus   . HTN (hypertension)   . PVD (peripheral vascular disease)     a. ABI 09/2011 - R normal, L moderate - saw Dr. Fletcher Anon - med rx.   . Carotid artery disease     a. Dopp 09/2011: 94-17% RICA, 40-81% LICA.;  b.  Carotid US (7/14):  Bilateral 60-79% => f/u 6 mos  . Coronary artery disease     a. AWMI requiring IABP 2005 s/p Horizon study stent-LAD, staged BMS-Cx, DESx2-RCA. b. Last Last LHC  (6/06):  EF 25%, pLAD stent 40-50, after stent 40, dLAD 20, pCFX 20, pOM1 70, pRCA 20, mRCA stents ok.=> med Rx;  c.  Myoview (7/14):  EF 26%, large ant, septal, inf and apical infarct, no ischemia.  Med Rx continued  . RIATA ICD Lead--on advisory recall     Family History  Problem Relation Age of Onset  . Diabetes Mother   . Coronary artery disease Father    SOCIAL HISTORY: History  Substance Use Topics  . Smoking status: Current Some Day Smoker -- 1.00 packs/day for 33 years    Types: Cigarettes  . Smokeless tobacco: Never Used     Comment: smoker for 33 years  . Alcohol Use: No   Allergies  Allergen Reactions  . Niacin Itching and Other (See Comments)    Flushing    Current Outpatient Prescriptions  Medication Sig Dispense Refill  . aspirin 81 MG tablet Take 1 tablet (81 mg total) by mouth daily. 30 tablet 3  . atorvastatin (LIPITOR) 80 MG tablet Take 80 mg by mouth every other day.     . benazepril (LOTENSIN) 5 MG tablet Take 0.5 tablets (2.5 mg total) by mouth daily. 30 tablet 5  . carvedilol (COREG) 6.25 MG tablet Take 1 tablet (6.25 mg total) by mouth 2 (two) times daily with  a meal. (Patient taking differently: Take 3.125 mg by mouth 2 (two) times daily with a meal. ) 60 tablet 2  . cephALEXin (KEFLEX) 500 MG capsule Take 1 capsule (500 mg total) by mouth 3 (three) times daily. 42 capsule 1  . ezetimibe (ZETIA) 10 MG tablet Take 1 tablet (10 mg total) by mouth daily. 30 tablet 6  . glucose blood test strip Use as instructed 100 each 12  . glucose monitoring kit (FREESTYLE) monitoring kit 1 each by Does not apply route as needed for other. 1 each 0  . insulin detemir (LEVEMIR) 100 UNIT/ML injection Inject 0.18 mLs (18 Units total) into the skin at bedtime. 10 mL 11  . insulin NPH-regular Human (NOVOLIN 70/30) (70-30) 100 UNIT/ML injection Inject 16 Units into the skin 2 (two) times daily with a meal.    . loratadine (CLARITIN) 10 MG tablet Take 10 mg by mouth daily as needed  for allergies.     . metolazone (ZAROXOLYN) 2.5 MG tablet Take 1 tablet (2.5 mg total) by mouth See admin instructions. Take 2.5 mg daily for 2 consecutive days (two tablets per week) if needed for excess fluid when LASIX does not work. 30 tablet 2  . polyethylene glycol (MIRALAX / GLYCOLAX) packet Take 17 g by mouth daily as needed for mild constipation.     . potassium chloride (K-DUR,KLOR-CON) 10 MEQ tablet as needed.    . senna-docusate (SENOKOT-S) 8.6-50 MG per tablet Take 1 tablet by mouth at bedtime as needed for mild constipation. 30 tablet 2  . spironolactone (ALDACTONE) 25 MG tablet Take 0.5 tablets (12.5 mg total) by mouth daily. MUST HAVE FOLLOW UP APPOINTMENT FOR FURTHER REFILLS 30 tablet 1  . torsemide (DEMADEX) 20 MG tablet Take 3 tablets (60 mg total) by mouth daily. MUST HAVE FOLLOW UP APPOINTMENT FOR FURTHER REFILLS 90 tablet 1   No current facility-administered medications for this visit.   REVIEW OF SYSTEMS: Valu.Nieves ] denotes positive finding; [  ] denotes negative finding  CARDIOVASCULAR:  [ ]  chest pain   [ ]  chest pressure PULMONARY:   [ ]  productive cough   [ ]  asthma   [ ]  wheezing NEUROLOGIC:   [ ]  weakness  [ ]  paresthesias  [ ]  aphasia  [ ]  amaurosis  [ ]  dizziness CONSTITUTIONAL:  [ ]  fever   [ ]  chills  PHYSICAL EXAM: Filed Vitals:   02/18/14 0907  BP: 106/66  Pulse: 97  Temp: 98.4 F (36.9 C)  TempSrc: Oral  Height: 6' (1.829 m)  Weight: 191 lb 3.2 oz (86.728 kg)  SpO2: 100%   Body mass index is 25.93 kg/(m^2). GENERAL: The patient is a well-nourished male, in no acute distress. The vital signs are documented above. CARDIOVASCULAR: There is a regular rate and rhythm. He has a palpable right femoral and right popliteal pulse. PULMONARY: There is good air exchange bilaterally without wheezing or rales. SKIN: one of the wounds adjacent to the right great toe has completely healed. The wound that was 3 cm x 2 cm now measures 2 cm x 1.8 cm. As no significant  drainage. PSYCHIATRIC: The patient has a normal affect.  MEDICAL ISSUES: The patient is doing well status post right femoral to below-knee popliteal artery bypass with a vein graft. The wounds are healing. He will continue to soak the foot daily and keep dressing changes on the right great toe wound. We have again discussed the importance of tobacco cessation. We have discussed the importance of  leg elevation given that he does have chronic venous insufficiency. I'll see him back in 3 months. He knows to call sooner if he has problems. I have ordered ABIs and a graft duplex at that next visit.   Return in about 3 months (around 05/19/2014).  [place] [facility] [place] Beeper: [phone]

## 2014-04-21 ENCOUNTER — Ambulatory Visit (INDEPENDENT_AMBULATORY_CARE_PROVIDER_SITE_OTHER): Payer: BLUE CROSS/BLUE SHIELD | Admitting: Internal Medicine

## 2014-04-21 ENCOUNTER — Encounter: Payer: Self-pay | Admitting: Internal Medicine

## 2014-04-21 VITALS — BP 90/54 | HR 79 | Ht 72.0 in | Wt 211.4 lb

## 2014-04-21 DIAGNOSIS — I5022 Chronic systolic (congestive) heart failure: Secondary | ICD-10-CM | POA: Diagnosis not present

## 2014-04-21 DIAGNOSIS — Z95810 Presence of automatic (implantable) cardiac defibrillator: Secondary | ICD-10-CM

## 2014-04-21 LAB — MDC_IDC_ENUM_SESS_TYPE_INCLINIC
Battery Remaining Longevity: 90 mo
Brady Statistic RV Percent Paced: 0 %
Date Time Interrogation Session: 20160419145914
HighPow Impedance: 49.4592
Implantable Pulse Generator Serial Number: 7155171
Lead Channel Impedance Value: 400 Ohm
Lead Channel Pacing Threshold Amplitude: 0.75 V
Lead Channel Pacing Threshold Amplitude: 0.75 V
Lead Channel Pacing Threshold Pulse Width: 0.5 ms
Lead Channel Pacing Threshold Pulse Width: 0.5 ms
Lead Channel Sensing Intrinsic Amplitude: 11.7 mV
Lead Channel Setting Pacing Amplitude: 2.5 V
Lead Channel Setting Pacing Pulse Width: 0.5 ms
Lead Channel Setting Sensing Sensitivity: 0.5 mV
Zone Setting Detection Interval: 250 ms
Zone Setting Detection Interval: 300 ms

## 2014-04-21 NOTE — Progress Notes (Signed)
Patient Care Team: Sigmund HazelLisa [name], MD as PCP - General (Family Medicine)   HPI  Dalton RainwaterLarry D [name] is a 59 y.o. male Seen in followup for ischemic heart disease with prior stenting of LAD/RCA and LCx with last Cath 2006. He has a previously implanted ICD for which  he underwent generator replacement 12/14 .  He has known peripheral vascular disease with ABIs in remote past and carotid Dopplers in our office a year ago with 60-79% bilaterally.   He was 11/15  hosp for chf  EF 25 %;Last myoview was in 07/2012 with large, severe, fixed anterior, septal, inferior and apical infarct; no ischemia, EF 26%      When seen in January he had hypotension prompting us to reduce his ACE inhibitor beta blockers Aldactone by 50%. Interval blood pressures were better; he comes in today with a blood pressure again of 90.  He has had nonhealing ulcers of his right foot. He was hospitalized and underwent revascularization.  These have been vastly improved since we reduced his afterload reduction. He's had one interval episode of significant lightheadedness. It lasted about 30 minutes and resolved. No arrhythmias were detected on his device.     Past Medical History  Diagnosis Date  . Hyperlipidemia     mixed  . Chronic systolic heart failure   . ICD (implantable cardiac defibrillator), dual, in situ     St. Jude for severe LVD EF 25% 2/07 explanted 2010. Medtronic Virtuoso II DR Dual-chamber cardiverter-defibrillator  with pocket revision, Dr. Graciela HusbandsKlein  . Tobacco abuse   . Ischemic cardiomyopathy     a. Echo (09/27/12): EF 20%, diffuse HK with periapical AK, no LV thrombus noted, restrictive physiology, trivial MR, mild LAE, RVSF mildly reduced, PASP 39  . Cerebrovascular disease   . Diabetes mellitus   . HTN (hypertension)   . PVD (peripheral vascular disease)     a. ABI 09/2011 - R normal, L moderate - saw Dr. Kirke CorinArida - med rx.   . Carotid artery disease     a. Dopp 09/2011: 60-79% RICA, 40-59% LICA.;  b.  Carotid US  (7/14):  Bilateral 60-79% => f/u 6 mos  . Coronary artery disease     a. AWMI requiring IABP 2005 s/p Horizon study stent-LAD, staged BMS-Cx, DESx2-RCA. b. Last Last LHC (6/06):  EF 25%, pLAD stent 40-50, after stent 40, dLAD 20, pCFX 20, pOM1 70, pRCA 20, mRCA stents ok.=> med Rx;  c.  Myoview (7/14):  EF 26%, large ant, septal, inf and apical infarct, no ischemia.  Med Rx continued  . RIATA ICD Lead--on advisory recall      Past Surgical History  Procedure Laterality Date  . Medtronic virtuoso ii dr dual-chamber cardioverter-defibrillation with pocket revison      Dr. Sherryl MangesSteven Chayna Surratt  . Femoral-popliteal bypass graft Right 11/07/2013    Procedure: Right FEMORAL-POPLITEAL ARTERY Bypass ;  Surgeon: Chuck Hinthristopher S Dickson, MD;  [facility];  Service: Vascular;  Laterality: Right;  . Intraoperative arteriogram Right 11/07/2013    Procedure: INTRA OPERATIVE ARTERIOGRAM;  Surgeon: Chuck Hinthristopher S Dickson, MD;  [facility];  Service: Vascular;  Laterality: Right;  . Implantable cardioverter defibrillator (icd) generator change N/A 12/16/2012    Procedure: ICD GENERATOR CHANGE;  Surgeon: Duke SalviaSteven C Arturo Freundlich, MD;  [facility];  Service: Cardiovascular;  Laterality: N/A;  . Abdominal aortagram N/A 11/06/2013    Procedure: ABDOMINAL AORTAGRAM;  Surgeon: Chuck Hinthristopher S Dickson, MD;  [facility];  Service: Cardiovascular;  Laterality: N/A;  . Lower extremity angiogram Bilateral 11/06/2013    Procedure: LOWER EXTREMITY ANGIOGRAM;  Surgeon: Chuck Hint, MD;  Location: [facility] CATH LAB;  Service: Cardiovascular;  Laterality: Bilateral;    Current Outpatient Prescriptions  Medication Sig Dispense Refill  . aspirin 81 MG tablet Take 1 tablet (81 mg total) by mouth daily. 30 tablet 3  . atorvastatin (LIPITOR) 80 MG tablet Take 80 mg by mouth every other day.     . benazepril (LOTENSIN) 5 MG tablet Take 5 mg by mouth daily.    . carvedilol (COREG) 6.25 MG tablet Take 1 tablet (6.25 mg  total) by mouth 2 (two) times daily with a meal. (Patient taking differently: Take 3.125 mg by mouth 2 (two) times daily with a meal. ) 60 tablet 2  . ezetimibe (ZETIA) 10 MG tablet Take 1 tablet (10 mg total) by mouth daily. 30 tablet 6  . insulin NPH Human (HUMULIN N,NOVOLIN N) 100 UNIT/ML injection Inject 10 Units into the skin daily.    . insulin NPH-regular Human (NOVOLIN 70/30) (70-30) 100 UNIT/ML injection Inject 16 Units into the skin 2 (two) times daily with a meal.    . JARDIANCE 25 MG TABS tablet Take 25 mg by mouth daily.    Marland Kitchen spironolactone (ALDACTONE) 25 MG tablet Take 0.5 tablets (12.5 mg total) by mouth daily. MUST HAVE FOLLOW UP APPOINTMENT FOR FURTHER REFILLS 30 tablet 1  . torsemide (DEMADEX) 20 MG tablet Take 3 tablets (60 mg total) by mouth daily. MUST HAVE FOLLOW UP APPOINTMENT FOR FURTHER REFILLS 90 tablet 1   No current facility-administered medications for this visit.    Allergies  Allergen Reactions  . Niacin Itching and Other (See Comments)    Flushing   . Metformin And Related     Vomiting     Review of Systems negative except from HPI and PMH  Physical Exam BP 90/54 mmHg  Pulse 79  Ht 6' (1.829 m)  Wt 211 lb 6.4 oz (95.89 kg)  BMI 28.66 kg/m2 Well developed and nourished in no acute distress HENT normal Neck supple with 7-8 cm  Clear Regular rate and rhythm,+s4 with 2/6 murmur Abd-soft with active BS No Clubbing cyanosis tr  edema Skin-warm and dry  Incompletely healed ulcer right foot A & Oriented  Grossly normal sensory and motor function   repeat blood pressure was 104 systolic  Assessment and  Plan  Hypotension   ischemic cardiomyopathy   implantable defibrillator St. Jude The patient's device was interrogated.  The information was reviewed. No changes were made in the programming.    Nonhealing ulcers.  There is been no interval episodes suggestive of ischemia   No interval arrhythmias   one episode of lightheadedness has  Not  recurred. We will have him take his afterload reduction prior to going to sleep whether by night or by day to try to minimize the impact during his workday.   Blood pressure today on repeat was 104 so we will leave his medications as they are.

## 2014-04-21 NOTE — Patient Instructions (Signed)
Medication Instructions:  Your physician recommends that you continue on your current medications as directed. Please refer to the Current Medication list given to you today.   Labwork: none  Testing/Procedures: none  Follow-Up: Your physician recommends that you schedule a follow-up appointment in:  3 months in the device clinic Your physician wants you to follow-up in: 12 months with Dr. Logan BoresKlein You will receive a reminder letter in the mail two months in advance. If you don't receive a letter, please call our office to schedule the follow-up appointment.

## 2014-05-25 ENCOUNTER — Encounter: Payer: Self-pay | Admitting: Vascular Surgery

## 2014-05-26 ENCOUNTER — Encounter: Payer: Self-pay | Admitting: Vascular Surgery

## 2014-05-27 ENCOUNTER — Ambulatory Visit (INDEPENDENT_AMBULATORY_CARE_PROVIDER_SITE_OTHER): Payer: BLUE CROSS/BLUE SHIELD | Admitting: Vascular Surgery

## 2014-05-27 ENCOUNTER — Ambulatory Visit (HOSPITAL_COMMUNITY)
Admission: RE | Admit: 2014-05-27 | Discharge: 2014-05-27 | Disposition: A | Discharge status: Home / Self Care | Payer: BLUE CROSS/BLUE SHIELD | Source: Ambulatory Visit | Attending: Vascular Surgery | Admitting: Vascular Surgery

## 2014-05-27 ENCOUNTER — Ambulatory Visit (INDEPENDENT_AMBULATORY_CARE_PROVIDER_SITE_OTHER)
Admission: RE | Admit: 2014-05-27 | Discharge: 2014-05-27 | Disposition: A | Discharge status: Home / Self Care | Payer: BLUE CROSS/BLUE SHIELD | Source: Ambulatory Visit | Attending: Vascular Surgery | Admitting: Vascular Surgery

## 2014-05-27 ENCOUNTER — Encounter: Payer: Self-pay | Admitting: Vascular Surgery

## 2014-05-27 VITALS — BP 95/58 | HR 80 | Ht 72.0 in | Wt 207.9 lb

## 2014-05-27 DIAGNOSIS — Z48812 Encounter for surgical aftercare following surgery on the circulatory system: Secondary | ICD-10-CM | POA: Diagnosis not present

## 2014-05-27 DIAGNOSIS — I739 Peripheral vascular disease, unspecified: Secondary | ICD-10-CM

## 2014-05-27 NOTE — Addendum Note (Signed)
Addended by: Sharee PimpleMCCHESNEY, MARILYN K on: 05/27/2014 11:09 AM   Modules accepted: Orders

## 2014-05-27 NOTE — Progress Notes (Signed)
[facility]  Patient name: [name] MRN: [id number] DOB: 10-27-1955 Sex: male  REASON FOR VISIT: Follow up after right femoral to below knee popliteal artery bypass.  HPI: [name] is a 59 y.o. male who had presented with an extensive nonhealing wound of his right foot. He underwent a right common femoral artery to below knee popliteal artery bypass with a vein graft on 11/07/2013. I last saw him on 02/18/2014. At that time the wound adjacent to the right great toe had completely healed. He had another wound that measured 2 cm x 1.8 cm with no significant drainage. He comes in for 3 month follow up visit.  He has no specific complaints. He has cut back on his smoking to half a pack per day. He has no significant rest pain or claudication except for some mild left calf claudication. He does have some wounds on the left lower extremity but states that the wound overlying his lateral malleolus he has had for over a year.   Past Medical History  Diagnosis Date  . Hyperlipidemia     mixed  . Chronic systolic heart failure   . ICD (implantable cardiac defibrillator), dual, in situ     St. Jude for severe LVD EF 25% 2/07 explanted 2010. Medtronic Virtuoso II DR Dual-chamber cardiverter-defibrillator  with pocket revision, Dr. Graciela Husbands  . Tobacco abuse   . Ischemic cardiomyopathy     a. Echo (09/27/12): EF 20%, diffuse HK with periapical AK, no LV thrombus noted, restrictive physiology, trivial MR, mild LAE, RVSF mildly reduced, PASP 39  . Cerebrovascular disease   . Diabetes mellitus   . HTN (hypertension)   . PVD (peripheral vascular disease)     a. ABI 09/2011 - R normal, L moderate - saw Dr. Kirke Corin - med rx.   . Carotid artery disease     a. Dopp 09/2011: 60-79% RICA, 40-59% LICA.;  b.  Carotid US (7/14):  Bilateral 60-79% => f/u 6 mos  . Coronary artery disease     a. AWMI requiring IABP 2005 s/p Horizon study stent-LAD, staged BMS-Cx, DESx2-RCA. b. Last  Last LHC (6/06):  EF 25%, pLAD stent 40-50, after stent 40, dLAD 20, pCFX 20, pOM1 70, pRCA 20, mRCA stents ok.=> med Rx;  c.  Myoview (7/14):  EF 26%, large ant, septal, inf and apical infarct, no ischemia.  Med Rx continued  . RIATA ICD Lead--on advisory recall     Family History  Problem Relation Age of Onset  . Diabetes Mother   . Coronary artery disease Father    SOCIAL HISTORY: History  Substance Use Topics  . Smoking status: Current Some Day Smoker -- 1.00 packs/day for 33 years    Types: Cigarettes  . Smokeless tobacco: Never Used     Comment: smoker for 33 years  . Alcohol Use: No   Allergies  Allergen Reactions  . Niacin Itching and Other (See Comments)    Flushing   . Metformin And Related     Vomiting    Current Outpatient Prescriptions  Medication Sig Dispense Refill  . aspirin 81 MG tablet Take 1 tablet (81 mg total) by mouth daily. 30 tablet 3  . atorvastatin (LIPITOR) 80 MG tablet Take 80 mg by mouth every other day.     . benazepril (LOTENSIN) 5 MG tablet Take 5 mg by mouth daily.    . carvedilol (COREG) 6.25 MG tablet Take 1 tablet (6.25 mg total) by mouth  2 (two) times daily with a meal. (Patient taking differently: Take 3.125 mg by mouth 2 (two) times daily with a meal. ) 60 tablet 2  . ezetimibe (ZETIA) 10 MG tablet Take 1 tablet (10 mg total) by mouth daily. 30 tablet 6  . insulin NPH Human (HUMULIN N,NOVOLIN N) 100 UNIT/ML injection Inject 10 Units into the skin daily.    . insulin NPH-regular Human (NOVOLIN 70/30) (70-30) 100 UNIT/ML injection Inject 16 Units into the skin 2 (two) times daily with a meal.    . JARDIANCE 25 MG TABS tablet Take 25 mg by mouth daily.    Marland Kitchen spironolactone (ALDACTONE) 25 MG tablet Take 0.5 tablets (12.5 mg total) by mouth daily. MUST HAVE FOLLOW UP APPOINTMENT FOR FURTHER REFILLS 30 tablet 1  . torsemide (DEMADEX) 20 MG tablet Take 3 tablets (60 mg total) by mouth daily. MUST HAVE FOLLOW UP APPOINTMENT FOR FURTHER REFILLS 90  tablet 1   No current facility-administered medications for this visit.   REVIEW OF SYSTEMS: Arly.Keller ] denotes positive finding; [  ] denotes negative finding  CARDIOVASCULAR:   chest pain    chest pressure    palpitations    orthopnea    dyspnea on exertion    claudication    rest pain    DVT    phlebitis PULMONARY:    productive cough    asthma    wheezing NEUROLOGIC:    weakness   paresthesias   aphasia   amaurosis   dizziness HEMATOLOGIC:    bleeding problems    clotting disorders MUSCULOSKELETAL:   joint pain    joint swelling Arly.Keller ] leg swelling GASTROINTESTINAL:   blood in stool    hematemesis GENITOURINARY:    dysuria    hematuria PSYCHIATRIC:   history of major depression INTEGUMENTARY:   rashes   ulcers CONSTITUTIONAL:   fever    chills  PHYSICAL EXAM: Filed Vitals:   05/27/14 0929  BP: 95/58  Pulse: 80  Height: 6' (1.829 m)  Weight: 207 lb 14.4 oz (94.303 kg)  SpO2: 100%   GENERAL: The patient is a well-nourished male, in no acute distress. The vital signs are documented above. CARDIOVASCULAR: There is a regular rate and rhythm. I do not detect carotid bruits. He has palpable femoral pulses. I cannot palpate pedal pulses. He has mild bilateral lower extreme swelling and hyperpigmentation bilaterally consistent with chronic venous insufficiency. PULMONARY: There is good air exchange bilaterally without wheezing or rales. ABDOMEN: Soft and non-tender with normal pitched bowel sounds.  MUSCULOSKELETAL: There are no major deformities or cyanosis. NEUROLOGIC: No focal weakness or paresthesias are detected. SKIN: wound on his medial first metatarsal head is simply a callus with no open wound or erythema. There is no drainage. On the left side he has a 1.5 cm scab just above the medial malleolus. There is a small wound over the lateral malleolus which she is states he has had for over a year and has  not changed at all. PSYCHIATRIC: The patient has a normal affect.  DATA:  I have independently interpreted his graft duplex. His bypass graft is widely patent with no areas of increased philosophy noted. I on the right is 100%. Toe pressure on the right is 42 mmHg.  MEDICAL ISSUES: ATHEROSCLEROSIS WITH ULCERATION: his bypass graft is working  well and the wounds in the right foot have healed. He has a small scab above his medial malleolus on the left and a small wound over his lateral malleolus. I'll see him back in 6 months with ABIs. If these wounds failed to heal based on his previous arteriogram (11/06/2013), he would be a candidate for a left femoral to above-knee pop bypass. He has essentially peroneal runoff only on the left.   Return in about 6 months (around 11/27/2014).   Waverly Ferrariickson, Latricia Cerrito [facility] [place] Beeper: [phone]

## 2014-06-19 ENCOUNTER — Other Ambulatory Visit: Payer: Self-pay | Admitting: *Deleted

## 2014-06-19 MED ORDER — BENAZEPRIL HCL 5 MG PO TABS: 5.0000 mg | ORAL_TABLET | Freq: Every day | ORAL | Status: DC

## 2014-06-30 ENCOUNTER — Other Ambulatory Visit: Payer: Self-pay | Admitting: *Deleted

## 2014-06-30 MED ORDER — TORSEMIDE 20 MG PO TABS: 60.0000 mg | ORAL_TABLET | Freq: Every day | ORAL | Status: DC

## 2014-08-18 NOTE — Progress Notes (Signed)
HPI: FU CAD and CHF. S/p ant MI in 2005 req IABP tx with Horizon study stent to the LAD followed by staged BMS to the CFX and DES x 2 to RCA, Ischemic CM, systolic CHF, s/p AICD, HTN, DM2, HL, carotid stenosis, PAD. Last LHC (6/06): EF 25%, pLAD stent 40-50, after stent 40, dLAD 20, pCFX 20, pOM1 70, pRCA 20, mRCA stents ok. Abdominal US (2/11): No AAA. Myoview (7/14): EF 26%, large ant, septal, inf and apical infarct, no ischemia. Med Rx continued. Carotid US (7/15): Bilateral 60-79% => f/u 12 mos. Echocardiogram 5/15 showed an ejection fraction of 20-25%, grade 2 diastolic dysfunction; moderate LAE, mild RAE, moderate MR. There was restrictive physiology. He also is followed now in congestive heart failure clinic. Had right fem-pop 11/15. Since last seen, the patient has dyspnea with more extreme activities but not with routine activities. It is relieved with rest. It is not associated with chest pain. There is no orthopnea, PND or pedal edema. There is no syncope or palpitations. There is no exertional chest pain.   Current Outpatient Prescriptions  Medication Sig Dispense Refill  . aspirin 81 MG tablet Take 1 tablet (81 mg total) by mouth daily. 30 tablet 3  . atorvastatin (LIPITOR) 80 MG tablet Take 80 mg by mouth every other day.     . benazepril (LOTENSIN) 5 MG tablet Take 1 tablet (5 mg total) by mouth daily. 30 tablet 6  . carvedilol (COREG) 6.25 MG tablet Take 1 tablet (6.25 mg total) by mouth 2 (two) times daily with a meal. (Patient taking differently: Take 3.125 mg by mouth 2 (two) times daily with a meal. ) 60 tablet 2  . ezetimibe (ZETIA) 10 MG tablet Take 1 tablet (10 mg total) by mouth daily. 30 tablet 6  . insulin NPH Human (HUMULIN N,NOVOLIN N) 100 UNIT/ML injection Inject 10 Units into the skin daily.     . insulin NPH-regular Human (NOVOLIN 70/30) (70-30) 100 UNIT/ML injection Inject 16 Units into the skin 2 (two) times daily with a meal.    . JARDIANCE 25 MG TABS tablet  Take 25 mg by mouth daily.    . metFORMIN (GLUCOPHAGE) 500 MG tablet Take 500 mg by mouth daily with breakfast.    . spironolactone (ALDACTONE) 25 MG tablet Take 0.5 tablets (12.5 mg total) by mouth daily. MUST HAVE FOLLOW UP APPOINTMENT FOR FURTHER REFILLS 30 tablet 1  . torsemide (DEMADEX) 20 MG tablet Take 3 tablets (60 mg total) by mouth daily. MUST HAVE FOLLOW UP APPOINTMENT FOR FURTHER REFILLS 90 tablet 1   No current facility-administered medications for this visit.     Past Medical History  Diagnosis Date  . Hyperlipidemia     mixed  . Chronic systolic heart failure   . ICD (implantable cardiac defibrillator), dual, in situ     St. Jude for severe LVD EF 25% 2/07 explanted 2010. Medtronic Virtuoso II DR Dual-chamber cardiverter-defibrillator  with pocket revision, Dr. Graciela Husbands  . Tobacco abuse   . Ischemic cardiomyopathy     a. Echo (09/27/12): EF 20%, diffuse HK with periapical AK, no LV thrombus noted, restrictive physiology, trivial MR, mild LAE, RVSF mildly reduced, PASP 39  . Cerebrovascular disease   . Diabetes mellitus   . HTN (hypertension)   . PVD (peripheral vascular disease)     a. ABI 09/2011 - R normal, L moderate - saw Dr. Kirke Corin - med rx.   . Carotid artery disease  a. Dopp 09/2011: 60-79% RICA, 40-59% LICA.;  b.  Carotid US (7/14):  Bilateral 60-79% => f/u 6 mos  . Coronary artery disease     a. AWMI requiring IABP 2005 s/p Horizon study stent-LAD, staged BMS-Cx, DESx2-RCA. b. Last Last LHC (6/06):  EF 25%, pLAD stent 40-50, after stent 40, dLAD 20, pCFX 20, pOM1 70, pRCA 20, mRCA stents ok.=> med Rx;  c.  Myoview (7/14):  EF 26%, large ant, septal, inf and apical infarct, no ischemia.  Med Rx continued  . RIATA ICD Lead--on advisory recall      Past Surgical History  Procedure Laterality Date  . Medtronic virtuoso ii dr dual-chamber cardioverter-defibrillation with pocket revison      Dr. Sherryl Manges  . Femoral-popliteal bypass graft Right 11/07/2013     Procedure: Right FEMORAL-POPLITEAL ARTERY Bypass ;  Surgeon: Chuck Hint, MD;  Location: [facility] OR;  Service: Vascular;  Laterality: Right;  . Intraoperative arteriogram Right 11/07/2013    Procedure: INTRA OPERATIVE ARTERIOGRAM;  Surgeon: Chuck Hint, MD;  Location: [facility] OR;  Service: Vascular;  Laterality: Right;  . Implantable cardioverter defibrillator (icd) generator change N/A 12/16/2012    Procedure: ICD GENERATOR CHANGE;  Surgeon: [name], MD;  Location: [facility] CATH LAB;  Service: Cardiovascular;  Laterality: N/A;  . Abdominal aortagram N/A 11/06/2013    Procedure: ABDOMINAL AORTAGRAM;  Surgeon: Chuck Hint, MD;  Location: [facility] CATH LAB;  Service: Cardiovascular;  Laterality: N/A;  . Lower extremity angiogram Bilateral 11/06/2013    Procedure: LOWER EXTREMITY ANGIOGRAM;  Surgeon: Chuck Hint, MD;  Location: [facility] CATH LAB;  Service: Cardiovascular;  Laterality: Bilateral;    Social History   Social History  . Marital Status: Single    Spouse Name: N/A  . Number of Children: N/A  . Years of Education: N/A   Occupational History  . [employer]    Full time   Social History Main Topics  . Smoking status: Current Some Day Smoker -- 1.00 packs/day for 33 years    Types: Cigarettes  . Smokeless tobacco: Never Used     Comment: smoker for 33 years  . Alcohol Use: No  . Drug Use: No     Comment: former Cocain, Acid, Marijuana - 25 years ago  . Sexual Activity: No   Other Topics Concern  . Not on file   Social History Narrative   Divorced.    ROS: no fevers or chills, productive cough, hemoptysis, dysphasia, odynophagia, melena, hematochezia, dysuria, hematuria, rash, seizure activity, orthopnea, PND, pedal edema, claudication. Remaining systems are negative.  Physical Exam: Well-developed well-nourished in no acute distress.  Skin is warm and dry.  HEENT is normal.  Neck is supple.  Chest is clear to auscultation with normal expansion.    Cardiovascular exam is regular rate and rhythm. 2/6 systolic murmur apex Abdominal exam nontender or distended. No masses palpated. Extremities show small ulcers and erythema but no edema. neuro grossly intact  ECG sinus rhythm with occasional PVCs. Left axis deviation. Left atrial enlargement. Anterior lateral infarct. Cannot rule out prior inferior infarct.

## 2014-08-20 ENCOUNTER — Ambulatory Visit (INDEPENDENT_AMBULATORY_CARE_PROVIDER_SITE_OTHER): Payer: BLUE CROSS/BLUE SHIELD | Admitting: Cardiology

## 2014-08-20 ENCOUNTER — Encounter: Payer: Self-pay | Admitting: Cardiology

## 2014-08-20 ENCOUNTER — Encounter: Payer: Self-pay | Admitting: *Deleted

## 2014-08-20 VITALS — BP 106/72 | HR 89 | Ht 73.0 in | Wt 199.4 lb

## 2014-08-20 DIAGNOSIS — F17200 Nicotine dependence, unspecified, uncomplicated: Secondary | ICD-10-CM

## 2014-08-20 DIAGNOSIS — Z720 Tobacco use: Secondary | ICD-10-CM

## 2014-08-20 DIAGNOSIS — Z95810 Presence of automatic (implantable) cardiac defibrillator: Secondary | ICD-10-CM

## 2014-08-20 DIAGNOSIS — I679 Cerebrovascular disease, unspecified: Secondary | ICD-10-CM

## 2014-08-20 DIAGNOSIS — I2510 Atherosclerotic heart disease of native coronary artery without angina pectoris: Secondary | ICD-10-CM | POA: Diagnosis not present

## 2014-08-20 DIAGNOSIS — E785 Hyperlipidemia, unspecified: Secondary | ICD-10-CM | POA: Diagnosis not present

## 2014-08-20 DIAGNOSIS — I5022 Chronic systolic (congestive) heart failure: Secondary | ICD-10-CM | POA: Diagnosis not present

## 2014-08-20 DIAGNOSIS — I10 Essential (primary) hypertension: Secondary | ICD-10-CM

## 2014-08-20 DIAGNOSIS — I739 Peripheral vascular disease, unspecified: Secondary | ICD-10-CM

## 2014-08-20 MED ORDER — TORSEMIDE 20 MG PO TABS: 60.0000 mg | ORAL_TABLET | Freq: Once | ORAL | Status: DC

## 2014-08-20 MED ORDER — ATORVASTATIN CALCIUM 80 MG PO TABS: 80.0000 mg | ORAL_TABLET | ORAL | Status: DC

## 2014-08-20 NOTE — Assessment & Plan Note (Signed)
Patient is not taking his Lipitor at present. Resume 80 mg daily. Check lipids and liver in 4 weeks.

## 2014-08-20 NOTE — Assessment & Plan Note (Signed)
Followed by electrophysiology. 

## 2014-08-20 NOTE — Assessment & Plan Note (Signed)
Continue aspirin. Resume statin. 

## 2014-08-20 NOTE — Assessment & Plan Note (Signed)
Followed by vascular surgery. 

## 2014-08-20 NOTE — Assessment & Plan Note (Signed)
Patient counseled on discontinuing. 

## 2014-08-20 NOTE — Assessment & Plan Note (Addendum)
Patient is euvolemic on examination. Continue present dose of diuretic. We discussed low-sodium diet. He will take additional Demadex for weight gain of 2-3 pounds or increasing edema. Check potassium and renal function.

## 2014-08-20 NOTE — Assessment & Plan Note (Signed)
Continue ACE inhibitor and beta blocker. 

## 2014-08-20 NOTE — Assessment & Plan Note (Signed)
Schedule follow-up carotid Dopplers. 

## 2014-08-20 NOTE — Patient Instructions (Addendum)
Your physician wants you to follow-up in: 6 MONTHS WITH DR Jens Som You will receive a reminder letter in the mail two months in advance. If you don't receive a letter, please call our office to schedule the follow-up appointment.   START ATORVASTATIN 80 MG ONCE DAILY  Your physician recommends that you return for lab work in: 4 WEEKS=DO NOT EAT PRIOR TO LAB WORK=CALL [address] FOR APPOINTMENT=[phone].  Your physician has requested that you have a carotid duplex. This test is an ultrasound of the carotid arteries in your neck. It looks at blood flow through these arteries that supply the brain with blood. Allow one hour for this exam. There are no restrictions or special instructions.

## 2014-08-20 NOTE — Assessment & Plan Note (Signed)
Blood pressure controlled. Continue present medications. 

## 2014-08-26 ENCOUNTER — Other Ambulatory Visit: Payer: Self-pay | Admitting: Cardiology

## 2014-08-26 DIAGNOSIS — I5022 Chronic systolic (congestive) heart failure: Secondary | ICD-10-CM

## 2014-08-26 DIAGNOSIS — Z95810 Presence of automatic (implantable) cardiac defibrillator: Secondary | ICD-10-CM

## 2014-08-26 DIAGNOSIS — I679 Cerebrovascular disease, unspecified: Secondary | ICD-10-CM

## 2014-08-26 DIAGNOSIS — I739 Peripheral vascular disease, unspecified: Secondary | ICD-10-CM

## 2014-08-26 DIAGNOSIS — E785 Hyperlipidemia, unspecified: Secondary | ICD-10-CM

## 2014-08-26 DIAGNOSIS — I2510 Atherosclerotic heart disease of native coronary artery without angina pectoris: Secondary | ICD-10-CM

## 2014-08-26 DIAGNOSIS — F17200 Nicotine dependence, unspecified, uncomplicated: Secondary | ICD-10-CM

## 2014-08-26 DIAGNOSIS — I6523 Occlusion and stenosis of bilateral carotid arteries: Secondary | ICD-10-CM

## 2014-08-26 DIAGNOSIS — I10 Essential (primary) hypertension: Secondary | ICD-10-CM

## 2014-08-28 ENCOUNTER — Other Ambulatory Visit (HOSPITAL_COMMUNITY): Payer: Self-pay | Admitting: Internal Medicine

## 2014-09-01 ENCOUNTER — Ambulatory Visit (HOSPITAL_COMMUNITY)
Admission: RE | Admit: 2014-09-01 | Discharge: 2014-09-01 | Disposition: A | Discharge status: Home / Self Care | Payer: BLUE CROSS/BLUE SHIELD | Source: Ambulatory Visit | Attending: Cardiology | Admitting: Cardiology

## 2014-09-01 DIAGNOSIS — I739 Peripheral vascular disease, unspecified: Secondary | ICD-10-CM | POA: Diagnosis not present

## 2014-09-01 DIAGNOSIS — I5022 Chronic systolic (congestive) heart failure: Secondary | ICD-10-CM | POA: Insufficient documentation

## 2014-09-01 DIAGNOSIS — Z95810 Presence of automatic (implantable) cardiac defibrillator: Secondary | ICD-10-CM | POA: Insufficient documentation

## 2014-09-01 DIAGNOSIS — E785 Hyperlipidemia, unspecified: Secondary | ICD-10-CM | POA: Diagnosis not present

## 2014-09-01 DIAGNOSIS — I6523 Occlusion and stenosis of bilateral carotid arteries: Secondary | ICD-10-CM | POA: Insufficient documentation

## 2014-09-01 DIAGNOSIS — I10 Essential (primary) hypertension: Secondary | ICD-10-CM | POA: Diagnosis not present

## 2014-09-01 DIAGNOSIS — I679 Cerebrovascular disease, unspecified: Secondary | ICD-10-CM | POA: Diagnosis not present

## 2014-09-01 DIAGNOSIS — I2510 Atherosclerotic heart disease of native coronary artery without angina pectoris: Secondary | ICD-10-CM | POA: Diagnosis not present

## 2014-09-01 DIAGNOSIS — F17200 Nicotine dependence, unspecified, uncomplicated: Secondary | ICD-10-CM

## 2014-09-21 ENCOUNTER — Inpatient Hospital Stay (HOSPITAL_COMMUNITY)
Admission: EM | Admit: 2014-09-21 | Discharge: 2014-09-23 | DRG: 603 | Disposition: A | Discharge status: Home / Self Care | Payer: BLUE CROSS/BLUE SHIELD | Attending: Internal Medicine | Admitting: Internal Medicine

## 2014-09-21 ENCOUNTER — Emergency Department (HOSPITAL_COMMUNITY): Payer: BLUE CROSS/BLUE SHIELD

## 2014-09-21 ENCOUNTER — Encounter (HOSPITAL_COMMUNITY): Payer: Self-pay | Admitting: Emergency Medicine

## 2014-09-21 DIAGNOSIS — T148XXA Other injury of unspecified body region, initial encounter: Secondary | ICD-10-CM

## 2014-09-21 DIAGNOSIS — L03116 Cellulitis of left lower limb: Principal | ICD-10-CM | POA: Diagnosis present

## 2014-09-21 DIAGNOSIS — Z955 Presence of coronary angioplasty implant and graft: Secondary | ICD-10-CM

## 2014-09-21 DIAGNOSIS — I679 Cerebrovascular disease, unspecified: Secondary | ICD-10-CM | POA: Diagnosis not present

## 2014-09-21 DIAGNOSIS — B871 Wound myiasis: Secondary | ICD-10-CM | POA: Diagnosis present

## 2014-09-21 DIAGNOSIS — I255 Ischemic cardiomyopathy: Secondary | ICD-10-CM | POA: Diagnosis present

## 2014-09-21 DIAGNOSIS — E1165 Type 2 diabetes mellitus with hyperglycemia: Secondary | ICD-10-CM | POA: Diagnosis present

## 2014-09-21 DIAGNOSIS — F17200 Nicotine dependence, unspecified, uncomplicated: Secondary | ICD-10-CM | POA: Diagnosis present

## 2014-09-21 DIAGNOSIS — I5022 Chronic systolic (congestive) heart failure: Secondary | ICD-10-CM | POA: Diagnosis present

## 2014-09-21 DIAGNOSIS — L97829 Non-pressure chronic ulcer of other part of left lower leg with unspecified severity: Secondary | ICD-10-CM | POA: Diagnosis present

## 2014-09-21 DIAGNOSIS — E118 Type 2 diabetes mellitus with unspecified complications: Secondary | ICD-10-CM

## 2014-09-21 DIAGNOSIS — E1151 Type 2 diabetes mellitus with diabetic peripheral angiopathy without gangrene: Secondary | ICD-10-CM | POA: Diagnosis present

## 2014-09-21 DIAGNOSIS — I70232 Atherosclerosis of native arteries of right leg with ulceration of calf: Secondary | ICD-10-CM | POA: Diagnosis not present

## 2014-09-21 DIAGNOSIS — I10 Essential (primary) hypertension: Secondary | ICD-10-CM | POA: Diagnosis present

## 2014-09-21 DIAGNOSIS — Z79899 Other long term (current) drug therapy: Secondary | ICD-10-CM

## 2014-09-21 DIAGNOSIS — E785 Hyperlipidemia, unspecified: Secondary | ICD-10-CM | POA: Diagnosis present

## 2014-09-21 DIAGNOSIS — Z7982 Long term (current) use of aspirin: Secondary | ICD-10-CM | POA: Diagnosis not present

## 2014-09-21 DIAGNOSIS — Z95810 Presence of automatic (implantable) cardiac defibrillator: Secondary | ICD-10-CM | POA: Diagnosis not present

## 2014-09-21 DIAGNOSIS — F17210 Nicotine dependence, cigarettes, uncomplicated: Secondary | ICD-10-CM | POA: Diagnosis present

## 2014-09-21 DIAGNOSIS — I2510 Atherosclerotic heart disease of native coronary artery without angina pectoris: Secondary | ICD-10-CM | POA: Diagnosis present

## 2014-09-21 DIAGNOSIS — I739 Peripheral vascular disease, unspecified: Secondary | ICD-10-CM | POA: Diagnosis present

## 2014-09-21 DIAGNOSIS — T148 Other injury of unspecified body region: Secondary | ICD-10-CM | POA: Diagnosis not present

## 2014-09-21 DIAGNOSIS — E119 Type 2 diabetes mellitus without complications: Secondary | ICD-10-CM

## 2014-09-21 DIAGNOSIS — Z794 Long term (current) use of insulin: Secondary | ICD-10-CM

## 2014-09-21 DIAGNOSIS — I252 Old myocardial infarction: Secondary | ICD-10-CM | POA: Diagnosis not present

## 2014-09-21 DIAGNOSIS — N390 Urinary tract infection, site not specified: Secondary | ICD-10-CM | POA: Diagnosis present

## 2014-09-21 DIAGNOSIS — Z8673 Personal history of transient ischemic attack (TIA), and cerebral infarction without residual deficits: Secondary | ICD-10-CM

## 2014-09-21 DIAGNOSIS — L98499 Non-pressure chronic ulcer of skin of other sites with unspecified severity: Secondary | ICD-10-CM | POA: Insufficient documentation

## 2014-09-21 DIAGNOSIS — L089 Local infection of the skin and subcutaneous tissue, unspecified: Secondary | ICD-10-CM

## 2014-09-21 LAB — CBC WITH DIFFERENTIAL/PLATELET
BASOS ABS: 0 10*3/uL (ref 0.0–0.1)
BASOS PCT: 0 %
EOS ABS: 0.2 10*3/uL (ref 0.0–0.7)
Eosinophils Relative: 2 %
HEMATOCRIT: 43.7 % (ref 39.0–52.0)
Hemoglobin: 14 g/dL (ref 13.0–17.0)
Lymphocytes Relative: 14 %
Lymphs Abs: 1.1 10*3/uL (ref 0.7–4.0)
MCH: 29.8 pg (ref 26.0–34.0)
MCHC: 32 g/dL (ref 30.0–36.0)
MCV: 93 fL (ref 78.0–100.0)
MONO ABS: 0.6 10*3/uL (ref 0.1–1.0)
Monocytes Relative: 7 %
NEUTROS PCT: 77 %
Neutro Abs: 6.2 10*3/uL (ref 1.7–7.7)
Platelets: 130 10*3/uL — ABNORMAL LOW (ref 150–400)
RBC: 4.7 MIL/uL (ref 4.22–5.81)
RDW: 16.4 % — AB (ref 11.5–15.5)
WBC: 8.1 10*3/uL (ref 4.0–10.5)

## 2014-09-21 LAB — COMPREHENSIVE METABOLIC PANEL
ALK PHOS: 153 U/L — AB (ref 38–126)
ALT: 41 U/L (ref 17–63)
AST: 41 U/L (ref 15–41)
Albumin: 3.4 g/dL — ABNORMAL LOW (ref 3.5–5.0)
Anion gap: 10 (ref 5–15)
BILIRUBIN TOTAL: 1.1 mg/dL (ref 0.3–1.2)
BUN: 12 mg/dL (ref 6–20)
CALCIUM: 8.9 mg/dL (ref 8.9–10.3)
CO2: 26 mmol/L (ref 22–32)
Chloride: 101 mmol/L (ref 101–111)
Creatinine, Ser: 0.91 mg/dL (ref 0.61–1.24)
GFR calc non Af Amer: >60 mL/min (ref >60)
GLUCOSE: 75 mg/dL (ref 65–99)
Potassium: 3.7 mmol/L (ref 3.5–5.1)
Sodium: 137 mmol/L (ref 135–145)
TOTAL PROTEIN: 7.8 g/dL (ref 6.5–8.1)

## 2014-09-21 LAB — I-STAT CG4 LACTIC ACID, ED: Lactic Acid, Venous: 1.18 mmol/L (ref 0.5–2.0)

## 2014-09-21 MED ORDER — VANCOMYCIN HCL 10 G IV SOLR
1750.0000 mg | Freq: Once | INTRAVENOUS | Status: AC
  Administered 2014-09-21: 1750 mg via INTRAVENOUS
  Filled 2014-09-21: qty 1750

## 2014-09-21 MED ORDER — DEXTROSE 5 % IV SOLN
1.0000 g | Freq: Once | INTRAVENOUS | Status: DC
  Filled 2014-09-21: qty 10

## 2014-09-21 MED ORDER — METRONIDAZOLE IN NACL 5-0.79 MG/ML-% IV SOLN
500.0000 mg | Freq: Once | INTRAVENOUS | Status: AC
  Administered 2014-09-22: 500 mg via INTRAVENOUS
  Filled 2014-09-21: qty 100

## 2014-09-21 NOTE — ED Notes (Signed)
The patient said he has had the wound for about year.  He says he sees Waverly Ferrari, a Stage manager for his poor circulation.  The paitent said he is here today because he has redness, swelling, pus and maggots in the wound.  He says he does not feel the maggots in there but he noticed they were there today.

## 2014-09-21 NOTE — ED Provider Notes (Signed)
CSN: [id number]     Arrival date & time 09/21/14  2105 History   First MD Initiated Contact with Patient 09/21/14 2138     Chief Complaint  Patient presents with  . Wound Infection    The patient said he has had the wound for about year.  He says he sees Waverly Ferrari, a Stage manager for his poor circulation.  The paitent said he is here today because he has redness, swelling, pus and maggots in the wound.       (Consider location/radiation/quality/duration/timing/severity/associated sxs/prior Treatment) Patient is a 59 y.o. male presenting with general illness.  Illness Location:  L shin Quality:  Ulceration, pain, wound Severity:  Moderate Onset quality:  Gradual Duration:  1 day Timing:  Constant Progression:  Worsening Chronicity:  Chronic Context:  Multiple dry ulcerations present chronically, months of ulceration on L shin, acutely worsening Relieved by:  Nothing Worsened by:  Nothing Associated symptoms: nausea   Associated symptoms: no abdominal pain, no fever, no shortness of breath and no vomiting     Past Medical History  Diagnosis Date  . Hyperlipidemia     mixed  . Chronic systolic heart failure   . ICD (implantable cardiac defibrillator), dual, in situ     St. Jude for severe LVD EF 25% 2/07 explanted 2010. Medtronic Virtuoso II DR Dual-chamber cardiverter-defibrillator  with pocket revision, Dr. Graciela Husbands  . Tobacco abuse   . Ischemic cardiomyopathy     a. Echo (09/27/12): EF 20%, diffuse HK with periapical AK, no LV thrombus noted, restrictive physiology, trivial MR, mild LAE, RVSF mildly reduced, PASP 39  . Cerebrovascular disease   . Diabetes mellitus   . HTN (hypertension)   . PVD (peripheral vascular disease)     a. ABI 09/2011 - R normal, L moderate - saw Dr. Kirke Corin - med rx.   . Carotid artery disease     a. Dopp 09/2011: 60-79% RICA, 40-59% LICA.;  b.  Carotid US (7/14):  Bilateral 60-79% => f/u 6 mos  . Coronary artery disease     a. AWMI  requiring IABP 2005 s/p Horizon study stent-LAD, staged BMS-Cx, DESx2-RCA. b. Last Last LHC (6/06):  EF 25%, pLAD stent 40-50, after stent 40, dLAD 20, pCFX 20, pOM1 70, pRCA 20, mRCA stents ok.=> med Rx;  c.  Myoview (7/14):  EF 26%, large ant, septal, inf and apical infarct, no ischemia.  Med Rx continued  . RIATA ICD Lead--on advisory recall     Past Surgical History  Procedure Laterality Date  . Medtronic virtuoso ii dr dual-chamber cardioverter-defibrillation with pocket revison      Dr. Sherryl Manges  . Femoral-popliteal bypass graft Right 11/07/2013    Procedure: Right FEMORAL-POPLITEAL ARTERY Bypass ;  Surgeon: Chuck Hint, MD;  Location: [facility] OR;  Service: Vascular;  Laterality: Right;  . Intraoperative arteriogram Right 11/07/2013    Procedure: INTRA OPERATIVE ARTERIOGRAM;  Surgeon: Chuck Hint, MD;  Location: [facility] OR;  Service: Vascular;  Laterality: Right;  . Implantable cardioverter defibrillator (icd) generator change N/A 12/16/2012    Procedure: ICD GENERATOR CHANGE;  Surgeon: [name], MD;  Location: [facility] CATH LAB;  Service: Cardiovascular;  Laterality: N/A;  . Abdominal aortagram N/A 11/06/2013    Procedure: ABDOMINAL AORTAGRAM;  Surgeon: Chuck Hint, MD;  Location: [facility] CATH LAB;  Service: Cardiovascular;  Laterality: N/A;  . Lower extremity angiogram Bilateral 11/06/2013    Procedure: LOWER EXTREMITY ANGIOGRAM;  Surgeon: Chuck Hint, MD;  Location: [facility]  CATH LAB;  Service: Cardiovascular;  Laterality: Bilateral;   Family History  Problem Relation Age of Onset  . Diabetes Mother   . Coronary artery disease Father    Social History  Substance Use Topics  . Smoking status: Current Some Day Smoker -- 1.00 packs/day for 33 years    Types: Cigarettes  . Smokeless tobacco: Never Used     Comment: smoker for 33 years  . Alcohol Use: No    Review of Systems  Constitutional: Negative for fever.  Respiratory: Negative for shortness of breath.    Gastrointestinal: Positive for nausea. Negative for vomiting and abdominal pain.  All other systems reviewed and are negative.     Allergies  Niacin and Metformin and related  Home Medications   Prior to Admission medications   Medication Sig Start Date End Date Taking? Authorizing Provider  aspirin 81 MG tablet Take 1 tablet (81 mg total) by mouth daily. 10/07/12  Yes [name], MD  atorvastatin (LIPITOR) 80 MG tablet Take 1 tablet (80 mg total) by mouth every other day. 08/20/14  Yes Lewayne Bunting, MD  benazepril (LOTENSIN) 5 MG tablet Take 1 tablet (5 mg total) by mouth daily. 06/19/14  Yes Lewayne Bunting, MD  carvedilol (COREG) 6.25 MG tablet Take 1 tablet (6.25 mg total) by mouth 2 (two) times daily with a meal. Patient taking differently: Take 3.125 mg by mouth 2 (two) times daily with a meal.  11/12/13  Yes Richarda Overlie, MD  ezetimibe (ZETIA) 10 MG tablet Take 1 tablet (10 mg total) by mouth daily. 01/08/14  Yes [name], MD  insulin NPH Human (HUMULIN N,NOVOLIN N) 100 UNIT/ML injection Inject 10 Units into the skin daily.    Yes Historical Provider, MD  insulin NPH-regular Human (NOVOLIN 70/30) (70-30) 100 UNIT/ML injection Inject 16-20 Units into the skin 2 (two) times daily with a meal. 20 units in the morning, 16 units in the evening   Yes Historical Provider, MD  JARDIANCE 25 MG TABS tablet Take 25 mg by mouth daily. 03/02/14  Yes Historical Provider, MD  metFORMIN (GLUCOPHAGE) 500 MG tablet Take 500 mg by mouth daily with breakfast.   Yes Historical Provider, MD  spironolactone (ALDACTONE) 25 MG tablet TAKE ONE-HALF TABLET BY MOUTH ONCE DAILY 08/28/14  Yes Dolores Patty, MD  torsemide (DEMADEX) 20 MG tablet Take 3 tablets (60 mg total) by mouth once. Patient taking differently: Take 60 mg by mouth daily.  08/20/14  Yes Lewayne Bunting, MD   BP 119/72 mmHg  Pulse 86  Temp(Src) 98.3 F (36.8 C) (Oral)  Resp 14  SpO2 94% Physical Exam  Constitutional: He is  oriented to person, place, and time. He appears well-developed and well-nourished.  HENT:  Head: Normocephalic and atraumatic.  Eyes: Conjunctivae and EOM are normal.  Neck: Normal range of motion. Neck supple.  Cardiovascular: Normal rate, regular rhythm and normal heart sounds.   Pulmonary/Chest: Effort normal and breath sounds normal. No respiratory distress.  Abdominal: He exhibits no distension. There is no tenderness. There is no rebound and no guarding.  Musculoskeletal: Normal range of motion.  Neurological: He is alert and oriented to person, place, and time.  Skin: Skin is warm and dry.  3 cm ulceration of anterior L shin with maggots crawling in wound.  No surrounding cellulitis  Vitals reviewed.   ED Course  Procedures (including critical care time) Labs Review Labs Reviewed  COMPREHENSIVE METABOLIC PANEL - Abnormal; Notable for the  following:    Albumin 3.4 (*)    Alkaline Phosphatase 153 (*)    All other components within normal limits  CBC WITH DIFFERENTIAL/PLATELET - Abnormal; Notable for the following:    RDW 16.4 (*)    Platelets 130 (*)    All other components within normal limits  CULTURE, BLOOD (ROUTINE X 2)  CULTURE, BLOOD (ROUTINE X 2)  URINE CULTURE  URINALYSIS, ROUTINE W REFLEX MICROSCOPIC (NOT AT [facility])  I-STAT CG4 LACTIC ACID, ED    Imaging Review No results found. I have personally reviewed and evaluated these images and lab results as part of my medical decision-making.   EKG Interpretation None      MDM   Final diagnoses:  Ulcer of left shin    59 y.o. male with pertinent PMH of DM, PVD, HLD, sCHF, cad presents with wound of L shin as above.  Pt not systemically ill and in minimal pain, but found maggots in his leg earlier in the day, so presented for evaluation.  These were in fact present.  Consulted medicine for admission, vanc/rocephin/flagyl given.    I have reviewed all laboratory and imaging studies if ordered as above  1. Ulcer  of left shin         Mirian Mo, MD 09/21/14 [phone]

## 2014-09-21 NOTE — H&P (Signed)
[facility] History and Physical  Patient: [name]  MRN: [id number]  DOB: 10/02/1955  DOS: the patient was seen and examined on 09/21/2014 PCP: [name], MD  Referring physician: Dr. Littie Deeds Chief Complaint: Wound infection  HPI: [name] is a 59 y.o. male with Past medical history of peripheral vascular disease, ICD implantation, chronic systolic heart failure, coronary artery disease, CVA, diabetes mellitus, active smoker. The patient is presenting with complaints of wound infection. The patient presented with complaining of discharge from his wound. He has also noted maggots crawling in the wound. He mentions that he has 3 separate open ulcers which has been chronically present and slowly healing. The patient has been applying it over-the-counter wound care. He has noted there was a big scab on one of the ulcer on the left foot. While he removed the scab he noted that there were made gets and out of concern is that to come to the hospital. He denies any complaints of fever, chills, chest pain, shortness of breath, cough, dizziness, lightheadedness, abdominal pain, diarrhea, constipation, nausea, vomiting, focal deficit. He denies any significant pain in his leg as well. He mentions he is compliant with all his medications other than aspirin which he takes on and off  The patient is coming from home.  At his baseline ambulates without any support And is independent for most of his ADL; manages his medication on his own.  Review of Systems: as mentioned in the history of present illness.  A comprehensive review of the other systems is negative.  Past Medical History  Diagnosis Date  . Hyperlipidemia     mixed  . Chronic systolic heart failure   . ICD (implantable cardiac defibrillator), dual, in situ     St. Jude for severe LVD EF 25% 2/07 explanted 2010. Medtronic Virtuoso II DR Dual-chamber cardiverter-defibrillator  with pocket revision, Dr. Graciela Husbands  .  Tobacco abuse   . Ischemic cardiomyopathy     a. Echo (09/27/12): EF 20%, diffuse HK with periapical AK, no LV thrombus noted, restrictive physiology, trivial MR, mild LAE, RVSF mildly reduced, PASP 39  . Cerebrovascular disease   . Diabetes mellitus   . HTN (hypertension)   . PVD (peripheral vascular disease)     a. ABI 09/2011 - R normal, L moderate - saw Dr. Kirke Corin - med rx.   . Carotid artery disease     a. Dopp 09/2011: 60-79% RICA, 40-59% LICA.;  b.  Carotid US (7/14):  Bilateral 60-79% => f/u 6 mos  . Coronary artery disease     a. AWMI requiring IABP 2005 s/p Horizon study stent-LAD, staged BMS-Cx, DESx2-RCA. b. Last Last LHC (6/06):  EF 25%, pLAD stent 40-50, after stent 40, dLAD 20, pCFX 20, pOM1 70, pRCA 20, mRCA stents ok.=> med Rx;  c.  Myoview (7/14):  EF 26%, large ant, septal, inf and apical infarct, no ischemia.  Med Rx continued  . RIATA ICD Lead--on advisory recall     Past Surgical History  Procedure Laterality Date  . Medtronic virtuoso ii dr dual-chamber cardioverter-defibrillation with pocket revison      Dr. Sherryl Manges  . Femoral-popliteal bypass graft Right 11/07/2013    Procedure: Right FEMORAL-POPLITEAL ARTERY Bypass ;  Surgeon: Chuck Hint, MD;  Location: [facility] OR;  Service: Vascular;  Laterality: Right;  . Intraoperative arteriogram Right 11/07/2013    Procedure: INTRA OPERATIVE ARTERIOGRAM;  Surgeon: Chuck Hint, MD;  Location: [facility] OR;  Service: Vascular;  Laterality: Right;  .  Implantable cardioverter defibrillator (icd) generator change N/A 12/16/2012    Procedure: ICD GENERATOR CHANGE;  Surgeon: [name], MD;  Location: [facility] CATH LAB;  Service: Cardiovascular;  Laterality: N/A;  . Abdominal aortagram N/A 11/06/2013    Procedure: ABDOMINAL AORTAGRAM;  Surgeon: Chuck Hint, MD;  Location[facility] CATH LAB;  Service: Cardiovascular;  Laterality: N/A;  . Lower extremity angiogram Bilateral 11/06/2013    Procedure: LOWER EXTREMITY ANGIOGRAM;   Surgeon: Chuck Hint, MD;  Location: [facility] CATH LAB;  Service: Cardiovascular;  Laterality: Bilateral;   Social History:  reports that he has been smoking Cigarettes.  He has a 33 pack-year smoking history. He has never used smokeless tobacco. He reports that he does not drink alcohol or use illicit drugs.  Allergies  Allergen Reactions  . Niacin Itching and Other (See Comments)    Flushing   . Metformin And Related     Vomiting     Family History  Problem Relation Age of Onset  . Diabetes Mother   . Coronary artery disease Father     Prior to Admission medications   Medication Sig Start Date End Date Taking? Authorizing Provider  aspirin 81 MG tablet Take 1 tablet (81 mg total) by mouth daily. 10/07/12  Yes [name], MD  atorvastatin (LIPITOR) 80 MG tablet Take 1 tablet (80 mg total) by mouth every other day. 08/20/14  Yes Lewayne Bunting, MD  benazepril (LOTENSIN) 5 MG tablet Take 1 tablet (5 mg total) by mouth daily. 06/19/14  Yes Lewayne Bunting, MD  carvedilol (COREG) 6.25 MG tablet Take 1 tablet (6.25 mg total) by mouth 2 (two) times daily with a meal. Patient taking differently: Take 3.125 mg by mouth 2 (two) times daily with a meal.  11/12/13  Yes Richarda Overlie, MD  ezetimibe (ZETIA) 10 MG tablet Take 1 tablet (10 mg total) by mouth daily. 01/08/14  Yes [name], MD  insulin NPH Human (HUMULIN N,NOVOLIN N) 100 UNIT/ML injection Inject 10 Units into the skin daily.    Yes Historical Provider, MD  insulin NPH-regular Human (NOVOLIN 70/30) (70-30) 100 UNIT/ML injection Inject 16-20 Units into the skin 2 (two) times daily with a meal. 20 units in the morning, 16 units in the evening   Yes Historical Provider, MD  JARDIANCE 25 MG TABS tablet Take 25 mg by mouth daily. 03/02/14  Yes Historical Provider, MD  metFORMIN (GLUCOPHAGE) 500 MG tablet Take 500 mg by mouth daily with breakfast.   Yes Historical Provider, MD  spironolactone (ALDACTONE) 25 MG tablet TAKE ONE-HALF  TABLET BY MOUTH ONCE DAILY 08/28/14  Yes Dolores Patty, MD  torsemide (DEMADEX) 20 MG tablet Take 3 tablets (60 mg total) by mouth once. Patient taking differently: Take 60 mg by mouth daily.  08/20/14  Yes Lewayne Bunting, MD    Physical Exam: Filed Vitals:   09/21/14 2119 09/21/14 2200  BP: 138/88 119/72  Pulse: 92 86  Temp: 98.3 F (36.8 C)   TempSrc: Oral   Resp: 16 14  SpO2: 100% 94%    General: Alert, Awake and Oriented to Time, Place and Person. Appear in mild distress Eyes: PERRL ENT: Oral Mucosa clear moist. Neck: no JVD Cardiovascular: S1 and S2 Present, no Murmur, Peripheral Pulses Present Respiratory: Bilateral Air entry equal and Decreased,  Clear to Auscultation, no Crackles, no wheezes Abdomen: Bowel Sound present, Soft and no tenderness Skin: Open ulcers on bilateral lower extremity. Left sheen ulcer shows Maggots crawling  Extremities: Bilateral Pedal edema, no calf tenderness Neurologic: Grossly no focal neuro deficit.  Labs on Admission:  CBC:  Recent Labs Lab 09/21/14 2135  WBC 8.1  NEUTROABS 6.2  HGB 14.0  HCT 43.7  MCV 93.0  PLT 130*    CMP     Component Value Date/Time   NA 137 09/21/2014 2135   K 3.7 09/21/2014 2135   CL 101 09/21/2014 2135   CO2 26 09/21/2014 2135   GLUCOSE 75 09/21/2014 2135   BUN 12 09/21/2014 2135   CREATININE 0.91 09/21/2014 2135   CREATININE 0.80 01/12/2014 0848   CALCIUM 8.9 09/21/2014 2135   PROT 7.8 09/21/2014 2135   ALBUMIN 3.4* 09/21/2014 2135   AST 41 09/21/2014 2135   ALT 41 09/21/2014 2135   ALKPHOS 153* 09/21/2014 2135   BILITOT 1.1 09/21/2014 2135   GFRNONAA >60 09/21/2014 2135   GFRNONAA >89 01/12/2014 0848   GFRAA >60 09/21/2014 2135   GFRAA >89 01/12/2014 0848    No results for input(s): CKTOTAL, CKMB, CKMBINDEX, TROPONINI in the last 168 hours. BNP (last 3 results) No results for input(s): BNP in the last 8760 hours.  ProBNP (last 3 results) No results for input(s): PROBNP in the  last 8760 hours.   Radiological Exams on Admission: No results found.  Assessment/Plan 1. Infected open wound The patient is presenting with 1 infection. Next and he has chronic nonhealing wound which aren't currently infected with makeups. The patient with this currently we'll only admitted in the hospital. I would treat him with broad-spectrum antibiotics. Wound care will be considered in the morning. Patient may benefit from vascular surgery consultation as well. Patient will be kept nothing by mouth in the morning.  2.Hyperlipidemia Continue simvastatin.  3.SYSTOLIC HEART FAILURE, CHRONIC Continuing his torsemide as well as Aldactone at home doses. Also continue Coreg.  4.Cerebrovascular disease 5.Peripheral vascular disease Continuing aspirin. Requested patient to continue taking aspirin on a regular basis to avoid any thrombotic event.  5.Hypertension Blood pressure is stable to continue home medications.  6.Automatic implantable cardioverter-defibrillator in St. Jude Continue close monitoring.  7.Type 2 diabetes mellitus Checking hemoglobin A1c. Holding oral metformin. Using insulin to 10 units twice a day, and continuing him on sliding scale.  Nutrition: Nothing by mouth in the morning DVT Prophylaxis: subcutaneous Heparin  Advance goals of care discussion: Full code   Disposition: Admitted as inpatient in med surge unit. Estimated length of stay: 2-3 days depending on wound infection workup  Author: Lynden Oxford, MD [facility] Pager: [phone] 09/21/2014  If 7PM-7AM, please contact night-coverage [email or web address] Password [email or web address]

## 2014-09-22 DIAGNOSIS — E119 Type 2 diabetes mellitus without complications: Secondary | ICD-10-CM

## 2014-09-22 DIAGNOSIS — E785 Hyperlipidemia, unspecified: Secondary | ICD-10-CM

## 2014-09-22 DIAGNOSIS — T148 Other injury of unspecified body region: Secondary | ICD-10-CM

## 2014-09-22 DIAGNOSIS — I679 Cerebrovascular disease, unspecified: Secondary | ICD-10-CM

## 2014-09-22 DIAGNOSIS — Z720 Tobacco use: Secondary | ICD-10-CM

## 2014-09-22 DIAGNOSIS — I5022 Chronic systolic (congestive) heart failure: Secondary | ICD-10-CM

## 2014-09-22 DIAGNOSIS — I739 Peripheral vascular disease, unspecified: Secondary | ICD-10-CM

## 2014-09-22 DIAGNOSIS — L089 Local infection of the skin and subcutaneous tissue, unspecified: Secondary | ICD-10-CM

## 2014-09-22 DIAGNOSIS — Z95810 Presence of automatic (implantable) cardiac defibrillator: Secondary | ICD-10-CM

## 2014-09-22 DIAGNOSIS — T148XXA Other injury of unspecified body region, initial encounter: Secondary | ICD-10-CM

## 2014-09-22 DIAGNOSIS — I70232 Atherosclerosis of native arteries of right leg with ulceration of calf: Secondary | ICD-10-CM

## 2014-09-22 LAB — URINALYSIS, ROUTINE W REFLEX MICROSCOPIC
Bilirubin Urine: NEGATIVE
Glucose, UA: >1000 mg/dL — AB
Ketones, ur: NEGATIVE mg/dL
NITRITE: NEGATIVE
Protein, ur: 100 mg/dL — AB
SPECIFIC GRAVITY, URINE: 1.031 — AB (ref 1.005–1.030)
Urobilinogen, UA: 1 mg/dL (ref 0.0–1.0)
pH: 5.5 (ref 5.0–8.0)

## 2014-09-22 LAB — BASIC METABOLIC PANEL
Anion gap: 7 (ref 5–15)
BUN: 13 mg/dL (ref 6–20)
CHLORIDE: 105 mmol/L (ref 101–111)
CO2: 26 mmol/L (ref 22–32)
Calcium: 8.5 mg/dL — ABNORMAL LOW (ref 8.9–10.3)
Creatinine, Ser: 0.93 mg/dL (ref 0.61–1.24)
GFR calc non Af Amer: >60 mL/min (ref >60)
Glucose, Bld: 120 mg/dL — ABNORMAL HIGH (ref 65–99)
POTASSIUM: 4.2 mmol/L (ref 3.5–5.1)
SODIUM: 138 mmol/L (ref 135–145)

## 2014-09-22 LAB — URINE MICROSCOPIC-ADD ON

## 2014-09-22 LAB — CBC WITH DIFFERENTIAL/PLATELET
Basophils Absolute: 0 10*3/uL (ref 0.0–0.1)
Basophils Relative: 0 %
EOS ABS: 0.1 10*3/uL (ref 0.0–0.7)
Eosinophils Relative: 2 %
HCT: 40.6 % (ref 39.0–52.0)
HEMOGLOBIN: 12.6 g/dL — AB (ref 13.0–17.0)
LYMPHS ABS: 1.2 10*3/uL (ref 0.7–4.0)
LYMPHS PCT: 20 %
MCH: 28.9 pg (ref 26.0–34.0)
MCHC: 31 g/dL (ref 30.0–36.0)
MCV: 93.1 fL (ref 78.0–100.0)
Monocytes Absolute: 0.5 10*3/uL (ref 0.1–1.0)
Monocytes Relative: 9 %
NEUTROS PCT: 69 %
Neutro Abs: 4.1 10*3/uL (ref 1.7–7.7)
Platelets: 136 10*3/uL — ABNORMAL LOW (ref 150–400)
RBC: 4.36 MIL/uL (ref 4.22–5.81)
RDW: 16.5 % — ABNORMAL HIGH (ref 11.5–15.5)
WBC: 6 10*3/uL (ref 4.0–10.5)

## 2014-09-22 LAB — GLUCOSE, CAPILLARY
GLUCOSE-CAPILLARY: 104 mg/dL — AB (ref 65–99)
GLUCOSE-CAPILLARY: 100 mg/dL — AB (ref 65–99)
GLUCOSE-CAPILLARY: 211 mg/dL — AB (ref 65–99)
Glucose-Capillary: 117 mg/dL — ABNORMAL HIGH (ref 65–99)
Glucose-Capillary: 220 mg/dL — ABNORMAL HIGH (ref 65–99)
Glucose-Capillary: 155 mg/dL — ABNORMAL HIGH (ref 65–99)

## 2014-09-22 LAB — SURGICAL PCR SCREEN
MRSA, PCR: NEGATIVE
STAPHYLOCOCCUS AUREUS: POSITIVE — AB

## 2014-09-22 LAB — C-REACTIVE PROTEIN: CRP: 0.6 mg/dL (ref <1.0)

## 2014-09-22 LAB — HIV ANTIBODY (ROUTINE TESTING W REFLEX): HIV SCREEN 4TH GENERATION: NONREACTIVE

## 2014-09-22 LAB — SEDIMENTATION RATE: Sed Rate: 16 mm/hr (ref 0–16)

## 2014-09-22 LAB — PREALBUMIN: PREALBUMIN: 9.6 mg/dL — AB (ref 18–38)

## 2014-09-22 LAB — I-STAT CG4 LACTIC ACID, ED: Lactic Acid, Venous: 0.75 mmol/L (ref 0.5–2.0)

## 2014-09-22 MED ORDER — ATORVASTATIN CALCIUM 80 MG PO TABS
80.0000 mg | ORAL_TABLET | ORAL | Status: DC
  Administered 2014-09-23: 80 mg via ORAL
  Filled 2014-09-22: qty 1

## 2014-09-22 MED ORDER — METRONIDAZOLE IN NACL 5-0.79 MG/ML-% IV SOLN: 500.0000 mg | Freq: Three times a day (TID) | INTRAVENOUS | Status: DC

## 2014-09-22 MED ORDER — ONDANSETRON HCL 4 MG PO TABS: 4.0000 mg | ORAL_TABLET | Freq: Four times a day (QID) | ORAL | Status: DC | PRN

## 2014-09-22 MED ORDER — INSULIN ASPART PROT & ASPART (70-30 MIX) 100 UNIT/ML ~~LOC~~ SUSP
10.0000 [IU] | Freq: Two times a day (BID) | SUBCUTANEOUS | Status: DC
  Administered 2014-09-22 – 2014-09-23 (×2): 10 [IU] via SUBCUTANEOUS
  Filled 2014-09-22: qty 10

## 2014-09-22 MED ORDER — ACETAMINOPHEN 325 MG PO TABS: 650.0000 mg | ORAL_TABLET | Freq: Four times a day (QID) | ORAL | Status: DC | PRN

## 2014-09-22 MED ORDER — TORSEMIDE 20 MG PO TABS
60.0000 mg | ORAL_TABLET | Freq: Every day | ORAL | Status: DC
  Administered 2014-09-22 – 2014-09-23 (×2): 60 mg via ORAL
  Filled 2014-09-22 (×2): qty 3

## 2014-09-22 MED ORDER — COLLAGENASE 250 UNIT/GM EX OINT
TOPICAL_OINTMENT | Freq: Every day | CUTANEOUS | Status: DC
  Administered 2014-09-23: 10:00:00 via TOPICAL
  Filled 2014-09-22: qty 30

## 2014-09-22 MED ORDER — VANCOMYCIN HCL 10 G IV SOLR
1250.0000 mg | Freq: Two times a day (BID) | INTRAVENOUS | Status: DC
  Administered 2014-09-22 – 2014-09-23 (×3): 1250 mg via INTRAVENOUS
  Filled 2014-09-22 (×5): qty 1250

## 2014-09-22 MED ORDER — ONDANSETRON HCL 4 MG/2ML IJ SOLN: 4.0000 mg | Freq: Four times a day (QID) | INTRAMUSCULAR | Status: DC | PRN

## 2014-09-22 MED ORDER — SPIRONOLACTONE 12.5 MG HALF TABLET
12.5000 mg | ORAL_TABLET | Freq: Every day | ORAL | Status: DC
  Administered 2014-09-22 – 2014-09-23 (×2): 12.5 mg via ORAL
  Filled 2014-09-22 (×2): qty 1

## 2014-09-22 MED ORDER — GLUCERNA SHAKE PO LIQD
237.0000 mL | Freq: Three times a day (TID) | ORAL | Status: DC
  Administered 2014-09-22 – 2014-09-23 (×2): 237 mL via ORAL

## 2014-09-22 MED ORDER — EZETIMIBE 10 MG PO TABS
10.0000 mg | ORAL_TABLET | Freq: Every day | ORAL | Status: DC
  Administered 2014-09-22 – 2014-09-23 (×2): 10 mg via ORAL
  Filled 2014-09-22 (×2): qty 1

## 2014-09-22 MED ORDER — DEXTROSE 5 % IV SOLN: 2.0000 g | INTRAVENOUS | Status: DC

## 2014-09-22 MED ORDER — INSULIN ASPART 100 UNIT/ML ~~LOC~~ SOLN
0.0000 [IU] | Freq: Four times a day (QID) | SUBCUTANEOUS | Status: DC
  Administered 2014-09-22: 5 [IU] via SUBCUTANEOUS
  Administered 2014-09-23 (×2): 3 [IU] via SUBCUTANEOUS

## 2014-09-22 MED ORDER — CARVEDILOL 3.125 MG PO TABS
3.1250 mg | ORAL_TABLET | Freq: Two times a day (BID) | ORAL | Status: DC
  Administered 2014-09-22 – 2014-09-23 (×3): 3.125 mg via ORAL
  Filled 2014-09-22 (×3): qty 1

## 2014-09-22 MED ORDER — BENAZEPRIL HCL 5 MG PO TABS
5.0000 mg | ORAL_TABLET | Freq: Every day | ORAL | Status: DC
  Administered 2014-09-22 – 2014-09-23 (×2): 5 mg via ORAL
  Filled 2014-09-22 (×2): qty 1

## 2014-09-22 MED ORDER — HEPARIN SODIUM (PORCINE) 5000 UNIT/ML IJ SOLN
5000.0000 [IU] | Freq: Three times a day (TID) | INTRAMUSCULAR | Status: DC
  Administered 2014-09-22 – 2014-09-23 (×5): 5000 [IU] via SUBCUTANEOUS
  Filled 2014-09-22 (×5): qty 1

## 2014-09-22 MED ORDER — ASPIRIN EC 81 MG PO TBEC
81.0000 mg | DELAYED_RELEASE_TABLET | Freq: Every day | ORAL | Status: DC
  Administered 2014-09-22 – 2014-09-23 (×2): 81 mg via ORAL
  Filled 2014-09-22 (×3): qty 1

## 2014-09-22 MED ORDER — DEXTROSE 5 % IV SOLN
2.0000 g | INTRAVENOUS | Status: DC
  Administered 2014-09-22 – 2014-09-23 (×2): 2 g via INTRAVENOUS
  Filled 2014-09-22 (×3): qty 2

## 2014-09-22 MED ORDER — METRONIDAZOLE IN NACL 5-0.79 MG/ML-% IV SOLN
500.0000 mg | Freq: Three times a day (TID) | INTRAVENOUS | Status: DC
  Administered 2014-09-22 – 2014-09-23 (×4): 500 mg via INTRAVENOUS
  Filled 2014-09-22 (×7): qty 100

## 2014-09-22 MED ORDER — ACETAMINOPHEN 650 MG RE SUPP: 650.0000 mg | Freq: Four times a day (QID) | RECTAL | Status: DC | PRN

## 2014-09-22 NOTE — Care Management Utilization Note (Signed)
Utilization review completed. Susan Brady, RN Case Manager [phone]. 

## 2014-09-22 NOTE — Consult Note (Addendum)
WOC wound consult note Reason for Consult: Consult requested for chronic left leg wounds. VVS consult is pending, according to EMR. Pt states he had re-vascularization to right leg previously and this healed a wound on that side. Wound type: 3 areas of full thickness wounds to left shin. Dry scabbed scar tissue area to right inner ankle where previous wound was located is .8X.8cm, no odor or drainage or topical treatment needed at this time. Left outer ankle dry scabbed scar tissue area where previous wound was located is 1X1cm, no odor or drainage or topical treatment needed at this time. Left lower shin with 2 full thickness wounds; .3X.3X.3cm and .2X.2X.3cm, both covered with dry red scabs which remove easily, revealing dark red, dry wound beds with slight amt odor, no drainage.  Left upper calf with full thickness wound; 2.5X2X.3cm, 100% yellow moist wound bed with small amt yellow drainage, some odor, maggots visible and moving in wound bed. Dressing procedure/placement/frequency: Pt states he has used Santyl for chemical debridement in the past.  Will re-order to assist with removal of nonviable tissue. Maggots have a fairly short life span and will die off on their own.  Foam dressing to contain them until they are no longer present.  Discussed plan of care with patient and he verbalized understanding. Please re-consult if further assistance is needed.  Thank-you,  Cammie Mcgee MSN, RN, CWOCN, [place], CNS [phone]

## 2014-09-22 NOTE — Consult Note (Signed)
Hospital Consult    Reason for Consult:  Non healing wound left leg Referring Physician:  Elmahi MRN #:  [id number]  History of Present Illness: This is a 59 y.o. male who is a pt of [name] and underwent a right CFA to below knee popliteal artery bypass with vein graft on 11/07/13 for non healing wounds right foot.  These wounds had healed.  He was seen back in the office in May 2015.  At that time, he did not have significant rest pain or claudication except for some mild left calf claudication.  At that time, he did have wounds on the LLE but stated that the wound overlying his lateral malleolus had been present for a year. He was supposed to f/u with [name] in 6 months with ABIs and if these wounds had not healed, (based on previous angiogram 11/06/13) he would be a candidate for a left femoral to above knee popliteal bypass.  He had essentially peroneal runoff on the left.  ABI's May 27, 2014 are as follows: Right:  1.2 Left:  0.71  TBI's May 27, 2014: Right:  0.55 Left:  0.18  He is followed by Dr. Nishan for carotid stenosis and his most recent carotid duplex revealed 60-79% stenosis on the right and 40-59% stenosis on the left.  The vertebral arteries were patent with antegrade flow.  He presented to the hospital yesterday with c/o on non healing wounds on the left foot.  The pt states there were maggots present in the wound.    The pt is on a statin for cholesterol management.  He is on a beta blocker and ACEI for blood pressure management.  He is on insulin for his diabetes.   Past Medical History  Diagnosis Date  . Hyperlipidemia     mixed  . Chronic systolic heart failure   . ICD (implantable cardiac defibrillator), dual, in situ     St. Jude for severe LVD EF 25% 2/07 explanted 2010. Medtronic Virtuoso II DR Dual-chamber cardiverter-defibrillator  with pocket revision, Dr. Klein  . Tobacco abuse   . Ischemic cardiomyopathy     a. Echo (09/27/12): EF 20%, diffuse  HK with periapical AK, no LV thrombus noted, restrictive physiology, trivial MR, mild LAE, RVSF mildly reduced, PASP 39  . Cerebrovascular disease   . Diabetes mellitus   . HTN (hypertension)   . PVD (peripheral vascular disease)     a. ABI 09/2011 - R normal, L moderate - saw Dr. Arida - med rx.   . Carotid artery disease     a. Dopp 09/2011: 60-79% RICA, 40-59% LICA.;  b.  Carotid US (7/14):  Bilateral 60-79% => f/u 6 mos  . Coronary artery disease     a. AWMI requiring IABP 2005 s/p Horizon study stent-LAD, staged BMS-Cx, DESx2-RCA. b. Last Last LHC (6/06):  EF 25%, pLAD stent 40-50, after stent 40, dLAD 20, pCFX 20, pOM1 70, pRCA 20, mRCA stents ok.=> med Rx;  c.  Myoview (7/14):  EF 26%, large ant, septal, inf and apical infarct, no ischemia.  Med Rx continued  . RIATA ICD Lead--on advisory recall      Past Surgical History  Procedure Laterality Date  . Medtronic virtuoso ii dr dual-chamber cardioverter-defibrillation with pocket revison      Dr. Steven Klein  . Femoral-popliteal bypass graft Right 11/07/2013    Procedure: Right FEMORAL-POPLITEAL ARTERY Bypass ;  Surgeon: Christopher S Dickson, MD;  Location: [facility] OR;  Service: Vascular;  Laterality:   Right;  . Intraoperative arteriogram Right 11/07/2013    Procedure: INTRA OPERATIVE ARTERIOGRAM;  Surgeon: Christopher S Dickson, MD;  Location: [facility] OR;  Service: Vascular;  Laterality: Right;  . Implantable cardioverter defibrillator (icd) generator change N/A 12/16/2012    Procedure: ICD GENERATOR CHANGE;  Surgeon: Steven C Klein, MD;  Location: [facility] CATH LAB;  Service: Cardiovascular;  Laterality: N/A;  . Abdominal aortagram N/A 11/06/2013    Procedure: ABDOMINAL AORTAGRAM;  Surgeon: Christopher S Dickson, MD;  Location: [facility] CATH LAB;  Service: Cardiovascular;  Laterality: N/A;  . Lower extremity angiogram Bilateral 11/06/2013    Procedure: LOWER EXTREMITY ANGIOGRAM;  Surgeon: Christopher S Dickson, MD;  Location: [facility] CATH LAB;  Service:  Cardiovascular;  Laterality: Bilateral;    Allergies  Allergen Reactions  . Niacin Itching and Other (See Comments)    Flushing   . Metformin And Related     Vomiting     Prior to Admission medications   Medication Sig Start Date End Date Taking? Authorizing Provider  aspirin 81 MG tablet Take 1 tablet (81 mg total) by mouth daily. 10/07/12  Yes Steven C Klein, MD  atorvastatin (LIPITOR) 80 MG tablet Take 1 tablet (80 mg total) by mouth every other day. 08/20/14  Yes Brian S Crenshaw, MD  benazepril (LOTENSIN) 5 MG tablet Take 1 tablet (5 mg total) by mouth daily. 06/19/14  Yes Brian S Crenshaw, MD  carvedilol (COREG) 6.25 MG tablet Take 1 tablet (6.25 mg total) by mouth 2 (two) times daily with a meal. Patient taking differently: Take 3.125 mg by mouth 2 (two) times daily with a meal.  11/12/13  Yes Nayana Abrol, MD  ezetimibe (ZETIA) 10 MG tablet Take 1 tablet (10 mg total) by mouth daily. 01/08/14  Yes Steven C Klein, MD  insulin NPH Human (HUMULIN N,NOVOLIN N) 100 UNIT/ML injection Inject 10 Units into the skin daily.    Yes Historical Provider, MD  insulin NPH-regular Human (NOVOLIN 70/30) (70-30) 100 UNIT/ML injection Inject 16-20 Units into the skin 2 (two) times daily with a meal. 20 units in the morning, 16 units in the evening   Yes Historical Provider, MD  JARDIANCE 25 MG TABS tablet Take 25 mg by mouth daily. 03/02/14  Yes Historical Provider, MD  metFORMIN (GLUCOPHAGE) 500 MG tablet Take 500 mg by mouth daily with breakfast.   Yes Historical Provider, MD  spironolactone (ALDACTONE) 25 MG tablet TAKE ONE-HALF TABLET BY MOUTH ONCE DAILY 08/28/14  Yes Daniel R Bensimhon, MD  torsemide (DEMADEX) 20 MG tablet Take 3 tablets (60 mg total) by mouth once. Patient taking differently: Take 60 mg by mouth daily.  08/20/14  Yes Brian S Crenshaw, MD    Social History   Social History  . Marital Status: Single    Spouse Name: N/A  . Number of Children: N/A  . Years of Education: N/A    Occupational History  . [employer]    Full time   Social History Main Topics  . Smoking status: Current Some Day Smoker -- 1.00 packs/day for 33 years    Types: Cigarettes  . Smokeless tobacco: Never Used     Comment: smoker for 33 years  . Alcohol Use: No  . Drug Use: No     Comment: former Cocain, Acid, Marijuana - 25 years ago  . Sexual Activity: No   Other Topics Concern  . Not on file   Social History Narrative   Divorced.     Family History  Problem   Relation Age of Onset  . Diabetes Mother   . Coronary artery disease Father     ROS: [x] Positive   [ ] Negative   [ ] All sytems reviewed and are negative  Cardiovascular: [] chest pain/pressure [] palpitations [] SOB lying flat [] DOE [] pain in legs while walking [] pain in legs at rest [] pain in legs at night [x] non-healing ulcers with maggots [] hx of DVT [] swelling in legs [x] ICD (EF 20-25%)  Pulmonary: [] productive cough [] asthma/wheezing [] home O2  Neurologic: [] weakness in [] arms [] legs [] numbness in [] arms [] legs [x] hx of CVA [] mini stroke [x] hx carotid artery stenosis Right > Left []difficulty speaking or slurred speech [] temporary loss of vision in one eye [] dizziness  Hematologic: [] hx of cancer [] bleeding problems [] problems with blood clotting easily  Endocrine:   [x] diabetes [] thyroid disease  GI [] vomiting blood [] blood in stool  GU: [] CKD/renal failure [] HD--[] M/W/F or [] T/T/S [] burning with urination [] blood in urine  Psychiatric: [] anxiety [] depression  Musculoskeletal: [] arthritis [] joint pain  Integumentary: [] rashes [x] ulcers  Constitutional: [] fever [] chills   Physical Examination  Filed Vitals:   09/22/14 1404  BP: 106/68  Pulse: 75  Temp: 98.6 F (37 C)  Resp: 18   There is no weight on file to calculate BMI.  General:  WDWN in NAD Gait: Not observed HENT: WNL, normocephalic Pulmonary: normal  non-labored breathing Skin: without rashes, with ulcers on the left lower extremity Vascular Exam/Pulses: Unable to palpate pedal pulses Extremities:  Most proximal wound on LLE had 2 maggots within the wound.  There are 3 non healing wounds on the anterior shin of the left leg; chronic skin changes below the knees bilaterally  Musculoskeletal: no muscle wasting or atrophy  Neurologic: A&O X 3; Appropriate Affect ; SENSATION: normal; MOTOR FUNCTION:  moving all extremities equally. Speech is fluent/normal   CBC    Component Value Date/Time   WBC 6.0 09/22/2014 0539   RBC 4.36 09/22/2014 0539   HGB 12.6* 09/22/2014 0539   HCT 40.6 09/22/2014 0539   PLT 136* 09/22/2014 0539   MCV 93.1 09/22/2014 0539   MCH 28.9 09/22/2014 0539   MCHC 31.0 09/22/2014 0539   RDW 16.5* 09/22/2014 0539   LYMPHSABS 1.2 09/22/2014 0539   MONOABS 0.5 09/22/2014 0539   EOSABS 0.1 09/22/2014 0539   BASOSABS 0.0 09/22/2014 0539    BMET    Component Value Date/Time   NA 138 09/22/2014 0539   K 4.2 09/22/2014 0539   CL 105 09/22/2014 0539   CO2 26 09/22/2014 0539   GLUCOSE 120* 09/22/2014 0539   BUN 13 09/22/2014 0539   CREATININE 0.93 09/22/2014 0539   CREATININE 0.80 01/12/2014 0848   CALCIUM 8.5* 09/22/2014 0539   GFRNONAA >60 09/22/2014 0539   GFRNONAA >89 01/12/2014 0848   GFRAA >60 09/22/2014 0539   GFRAA >89 01/12/2014 0848    COAGS: No results found for: INR, PROTIME   Non-Invasive Vascular Imaging:   ABI's May 27, 2014 are as follows: Right:  1.2 Left:  0.71  TBI's May 27, 2014: Right:  0.55 Left:  0.18  Statin:  Yes.   Beta Blocker:  Yes.   Aspirin:  Yes.   ACEI:  Yes.   ARB:  No. Other antiplatelets/anticoagulants:  No.    ASSESSMENT/PLAN:   This is a 59 y.o. male with PAD and non healing wounds and maggots present left lower extremity   -pt continues to have non healing wounds on the LLE  -given his aortogram was almost a year ago, he will need another aortogram with  runoff to best determine further management. -will order ABI's for tomorrow -will schedule this for Friday by Dr. Fields   Samantha Rhyne, PA-C [facility] [phone]   History and exam details as above.  Pt has several scattered ulcers left leg which are chronic.  It has been several months since his arteriogram so this will need repeating prior to proceeding with bypass.  Continue local wound care for left leg.  Will get ABI as well to check current perfusion status.  Pt can be dc'd home and return Friday for agram from my standpoint.  Will inform Dr Dickson of current pt situation  Charles Fields, MD [facility] [place] Office: [phone] Pager: [phone]    

## 2014-09-22 NOTE — Progress Notes (Signed)
[facility] PROGRESS NOTE   [name]:[id number] DOB: 1955-09-02 DOA: 09/21/2014 PCP: [name], MD  HPI/Subjective: Patient seen with sister at bedside, nontoxic looking. Denies any fever or chills. The wound infested with maggots. Leaflet of the pictures and her the physical examination.  Assessment/Plan: Principal Problem:   Infected open wound Active Problems:   Hyperlipidemia   TOBACCO ABUSE   SYSTOLIC HEART FAILURE, CHRONIC   Cerebrovascular disease   Peripheral vascular disease   Hypertension   Automatic implantable cardioverter-defibrillator in St. Jude   Type 2 diabetes mellitus   Infected open wound The patient is presenting with wound infection.  Chronic nonhealing wound which aren't currently infested with maggots Started on broad-spectrum antibiotics. Wound care will be considered in the morning. Had previous femoropopliteal bypass, vascular surgery consulted.  Hyperlipidemia Continue simvastatin.  SYSTOLIC HEART FAILURE, CHRONIC Continuing his torsemide as well as Aldactone at home doses. Also continue Coreg.  Cerebrovascular disease  Peripheral vascular disease Continuing aspirin. Requested patient to continue taking aspirin on a regular basis to avoid any thrombotic event.  Hypertension Blood pressure is stable to continue home medications.  Automatic implantable cardioverter-defibrillator in St. Jude Continue close monitoring.  Type 2 diabetes mellitus Checking hemoglobin A1c. Holding oral metformin. Using insulin to 10 units twice a day, and continuing him on sliding scale.  Code Status: Full Code Family Communication: Plan discussed with the patient. Disposition Plan: Remains inpatient Diet: Diet heart healthy/carb modified Room service appropriate?: Yes; Fluid consistency:: Thin  Consultants:  Vascular  Procedures:  None  Antibiotics:  Vancomycin and Zosyn   Objective: Filed Vitals:   09/22/14 1404    BP: 106/68  Pulse: 75  Temp: 98.6 F (37 C)  Resp: 18    Intake/Output Summary (Last 24 hours) at 09/22/14 1632 Last data filed at 09/22/14 1453  Gross per 24 hour  Intake    480 ml  Output   1200 ml  Net   -720 ml   There were no vitals filed for this visit.  Exam: General: Alert and awake, oriented x3, not in any acute distress. HEENT: anicteric sclera, pupils reactive to light and accommodation, EOMI CVS: S1-S2 clear, no murmur rubs or gallops Chest: clear to auscultation bilaterally, no wheezing, rales or rhonchi Abdomen: soft nontender, nondistended, normal bowel sounds, no organomegaly Extremities: no cyanosis, clubbing or edema noted bilaterally Neuro: Cranial nerves II-XII intact, no focal neurological deficits  LLE       Data Reviewed: Basic Metabolic Panel:  Recent Labs Lab 09/21/14 2135 09/22/14 0539  NA 137 138  K 3.7 4.2  CL 101 105  CO2 26 26  GLUCOSE 75 120*  BUN 12 13  CREATININE 0.91 0.93  CALCIUM 8.9 8.5*   Liver Function Tests:  Recent Labs Lab 09/21/14 2135  AST 41  ALT 41  ALKPHOS 153*  BILITOT 1.1  PROT 7.8  ALBUMIN 3.4*   No results for input(s): LIPASE, AMYLASE in the last 168 hours. No results for input(s): AMMONIA in the last 168 hours. CBC:  Recent Labs Lab 09/21/14 2135 09/22/14 0539  WBC 8.1 6.0  NEUTROABS 6.2 4.1  HGB 14.0 12.6*  HCT 43.7 40.6  MCV 93.0 93.1  PLT 130* 136*   Cardiac Enzymes: No results for input(s): CKTOTAL, CKMB, CKMBINDEX, TROPONINI in the last 168 hours. BNP (last 3 results) No results for input(s): BNP in the last 8760 hours.  ProBNP (last 3 results) No results for input(s): PROBNP in the last 8760 hours.  CBG:  Recent Labs Lab 09/22/14 0109 09/22/14 0549 09/22/14 1154 09/22/14 1607  GLUCAP 104* 117* 100* 211*    Micro Recent Results (from the past 240 hour(s))  Culture, blood (routine x 2)     Status: None (Preliminary result)   Collection Time: 09/21/14  9:36 PM   Result Value Ref Range Status   Specimen Description BLOOD LEFT FOREARM  Final   Special Requests BOTTLES DRAWN AEROBIC AND ANAEROBIC  Final   Culture NO GROWTH < 24 HOURS  Final   Report Status PENDING  Incomplete  Culture, blood (routine x 2)     Status: None (Preliminary result)   Collection Time: 09/21/14 10:26 PM  Result Value Ref Range Status   Specimen Description BLOOD RIGHT ANTECUBITAL  Final   Special Requests BOTTLES DRAWN AEROBIC AND ANAEROBIC 10CC  Final   Culture NO GROWTH < 24 HOURS  Final   Report Status PENDING  Incomplete  Surgical pcr screen     Status: Abnormal   Collection Time: 09/22/14  9:53 AM  Result Value Ref Range Status   MRSA, PCR NEGATIVE NEGATIVE Final   Staphylococcus aureus POSITIVE (A) NEGATIVE Final    Comment:        The Xpert SA Assay (FDA approved for NASAL specimens in patients over 49 years of age), is one component of a comprehensive surveillance program.  Test performance has been validated by [facility] for patients greater than or equal to 31 year old. It is not intended to diagnose infection nor to guide or monitor treatment.      Studies: Dg Tibia/fibula Left Port  09/22/2014   CLINICAL DATA:  Wounds of the left mid anterior tib-fib. Maggots in wound.  EXAM: PORTABLE LEFT TIBIA AND FIBULA - 2 VIEW  COMPARISON:  None.  FINDINGS: There is soft tissue defect at the mid anterior lower leg. No associated fracture or bone defect. Gastric calcifications are noted.  IMPRESSION: 1. No acute fracture. 2. Soft tissue defect of the mid anterior shin.   Electronically Signed   By: Norva Pavlov M.D.   On: 09/22/2014 00:06    Scheduled Meds: . aspirin EC  81 mg Oral Daily  . [START ON 09/23/2014] atorvastatin  80 mg Oral QODAY  . benazepril  5 mg Oral Daily  . carvedilol  3.125 mg Oral BID WC  . cefTRIAXone (ROCEPHIN)  IV  2 g Intravenous Q24H   And  . metronidazole  500 mg Intravenous Q8H  . collagenase   Topical Daily  .  ezetimibe  10 mg Oral Daily  . feeding supplement (GLUCERNA SHAKE)  237 mL Oral TID BM  . heparin  5,000 Units Subcutaneous 3 times per day  . insulin aspart  0-15 Units Subcutaneous Q6H  . insulin aspart protamine- aspart  10 Units Subcutaneous BID WC  . spironolactone  12.5 mg Oral Daily  . torsemide  60 mg Oral Daily  . vancomycin  1,250 mg Intravenous Q12H   Continuous Infusions:      Time spent: 35 minutes    [facility]  [facility] Pager [phone] If 7PM-7AM, please contact night-coverage at [email or web address], password [facility] 09/22/2014, 4:32 PM  LOS: 1 day

## 2014-09-22 NOTE — Progress Notes (Signed)
Initial Nutrition Assessment  DOCUMENTATION CODES:   Not applicable  INTERVENTION:   Provide Glucerna Shake po TID, each supplement provides 220 kcal and 10 grams of protein.  Encourage adequate PO intake.    NUTRITION DIAGNOSIS:   Increased nutrient needs related to wound healing as evidenced by estimated needs.  GOAL:   Patient will meet greater than or equal to 90% of their needs  MONITOR:   PO intake, Supplement acceptance, Weight trends, Labs, I & O's  REASON FOR ASSESSMENT:   Consult Wound healing  ASSESSMENT:   59 y.o. male with Past medical history of peripheral vascular disease, ICD implantation, chronic systolic heart failure, coronary artery disease, CVA, diabetes mellitus, active smoker. The patient is presenting with complaints of wound infection.  Diet has just been advanced. Pt reports appetite is good currently and PTA with consumption of at least 2 meals a day with no other difficulties. Usual body weight reported to be ~200 lbs. Weight has been stable. Pt is agreeable to nutritional supplements to aid in caloric and protein needs. RD to order.   Nutrition-Focused physical exam completed. Findings are no fat depletion, mild muscle depletion, and mild edema.   Labs and medications reviewed.  Diet Order:  Diet heart healthy/carb modified Room service appropriate?: Yes; Fluid consistency:: Thin  Skin:  Wound (see comment) (Open wound lower left leg, +1 LE edema)  Last BM:  9/19  Height:   Ht Readings from Last 1 Encounters:  08/20/14  (1.854 m)    Weight:   Wt Readings from Last 1 Encounters:  08/20/14 199 lb 7 oz (90.464 kg)    Ideal Body Weight:  83.6 kg  BMI:  There is no weight on file to calculate BMI.  Estimated Nutritional Needs:   Kcal:  2300-2500  Protein:  115-130 grams  Fluid:  2.3 - 2.5 L/day  EDUCATION NEEDS:   No education needs identified at this time  Roslyn Smiling, MS, RD, LDN Pager # [phone] After  hours/ weekend pager # [phone]

## 2014-09-22 NOTE — Progress Notes (Signed)
ANTIBIOTIC CONSULT NOTE - INITIAL  Pharmacy Consult for Vancomycin  Indication: LLE infection  Allergies  Allergen Reactions  . Niacin Itching and Other (See Comments)    Flushing   . Metformin And Related     Vomiting     Patient Measurements:   Adjusted Body Weight: 90 kg  Vital Signs: Temp: 98.3 F (36.8 C) (09/19 2119) Temp Source: Oral (09/19 2119) BP: 119/69 mmHg (09/20 0000) Pulse Rate: 81 (09/20 0000) Intake/Output from previous day:   Intake/Output from this shift:    Labs:  Recent Labs  09/21/14 2135  WBC 8.1  HGB 14.0  PLT 130*  CREATININE 0.91   CrCl cannot be calculated (Unknown ideal weight.). No results for input(s): VANCOTROUGH, VANCOPEAK, VANCORANDOM, GENTTROUGH, GENTPEAK, GENTRANDOM, TOBRATROUGH, TOBRAPEAK, TOBRARND, AMIKACINPEAK, AMIKACINTROU, AMIKACIN in the last 72 hours.   Microbiology: Recent Results (from the past 720 hour(s))  Culture, blood (routine x 2)     Status: None (Preliminary result)   Collection Time: 09/21/14  9:36 PM  Result Value Ref Range Status   Specimen Description BLOOD LEFT FOREARM  Final   Special Requests BOTTLES DRAWN AEROBIC AND ANAEROBIC  Final   Culture PENDING  Incomplete   Report Status PENDING  Incomplete    Medical History: Past Medical History  Diagnosis Date  . Hyperlipidemia     mixed  . Chronic systolic heart failure   . ICD (implantable cardiac defibrillator), dual, in situ     St. Jude for severe LVD EF 25% 2/07 explanted 2010. Medtronic Virtuoso II DR Dual-chamber cardiverter-defibrillator  with pocket revision, Dr. Graciela Husbands  . Tobacco abuse   . Ischemic cardiomyopathy     a. Echo (09/27/12): EF 20%, diffuse HK with periapical AK, no LV thrombus noted, restrictive physiology, trivial MR, mild LAE, RVSF mildly reduced, PASP 39  . Cerebrovascular disease   . Diabetes mellitus   . HTN (hypertension)   . PVD (peripheral vascular disease)     a. ABI 09/2011 - R normal, L moderate - saw Dr.  Kirke Corin - med rx.   . Carotid artery disease     a. Dopp 09/2011: 60-79% RICA, 40-59% LICA.;  b.  Carotid US (7/14):  Bilateral 60-79% => f/u 6 mos  . Coronary artery disease     a. AWMI requiring IABP 2005 s/p Horizon study stent-LAD, staged BMS-Cx, DESx2-RCA. b. Last Last LHC (6/06):  EF 25%, pLAD stent 40-50, after stent 40, dLAD 20, pCFX 20, pOM1 70, pRCA 20, mRCA stents ok.=> med Rx;  c.  Myoview (7/14):  EF 26%, large ant, septal, inf and apical infarct, no ischemia.  Med Rx continued  . RIATA ICD Lead--on advisory recall      Medications:  ASA  Lipitor  Lotensin  Coreg  Zetia  Aldactone  Demadex  NPH  NOvolin 70/30  Jardiance  Metformin  Assessment: 59 y.o. male with L leg wound infection for empiric antibiotics.  Vancomycin 1750 mg IV given in ED at midnight  Goal of Therapy:  Vancomycin trough level 15-20 mcg/ml  Plan:  Vancomycin 1250 mg IV q12h  Eddie Candle 09/22/2014,12:23 AM

## 2014-09-22 NOTE — ED Notes (Signed)
Dr. Patel at bedside 

## 2014-09-22 NOTE — Progress Notes (Signed)
Inpatient Diabetes Program Recommendations  AACE/ADA: New Consensus Statement on Inpatient Glycemic Control (2015)  Target Ranges:  Prepandial:   less than 140 mg/dL      Peak postprandial:   less than 180 mg/dL (1-2 hours)      Critically ill patients:  140 - 180 mg/dL  Results for TRENDEN, HAZELRIGG (MRN [id number]) as of 09/22/2014 10:50  Ref. Range 09/22/2014 01:09 09/22/2014 05:49  Glucose-Capillary Latest Ref Range: 65-99 mg/dL 191 (H) 478 (H)  Results for BRAYDON, KULLMAN (MRN [id number]) as of 09/22/2014 10:50  Ref. Range 09/21/2014 21:35 09/22/2014 05:39  Glucose Latest Ref Range: 65-99 mg/dL 75 657 (H)   Review of Glycemic Control  Diabetes history: DM2 Outpatient Diabetes medications: Jardiance 25 mg daily, Metformin 500 mg QAM, 70/30 20 units QAM with breakfast, 70/30 16 units QPM with supper, NPH 10 units QAM with breakfast Current orders for Inpatient glycemic control: 70/30 10 units BID with meals, Novolog 0-15 units Q6H  Inpatient Diabetes Program Recommendations: Insulin - Basal: Currently patient is ordered 70/30 10 units BID and patient is NPO at this time. CBGs have ranged from 75-117 since arriving at the hospital. If patient will continue to be NPO, may want to consider changing basal insulin to NPH 7 units BID (based on current 70/30 dose that is ordered).  Note: Spoke with patient over the phone about diabetes and home regimen for diabetes control. Patient reports that he is followed by Dr. Sharl Ma for diabetes management and currently he takes Jardiance 25 mg daily, Metformin 500 mg QAM, 70/30 20 units QAM with breakfast, 70/30 16 units QPM with supper, NPH 10 units QAM with breakfast as an outpatient for diabetes control. Inquired about prior A1C and patient reports that it was better than the one before and he thinks it was in the 8% range.  Inquired about compliance with taking medication and if he ever skipped insulin or DM medications. Patient states that he always takes his DM  medications as prescribed but he may forget to take a dose from time to time by accident. Patient states that he still is not able to check his glucose as often as Dr. Sharl Ma would like because his test strips for his glucometer are $32 for a box of 25 test strips. Talked with patient about Reli-On Glucometer and testing supplies at [facility] as the glucometer is $15 and a box of 50 strips is $9. Patient states that he will look into the Reli-On glucometer and testing supplies so he can afford to check his glucose more often. Patietn states that he rarely ever notes having any hypoglycemia symptoms. Inquired about whether he takes 70/30 and NPH even if he is not going to be able to eat a meal and patient states that he does continue to take the insulin even if he is not going to eat a meal. Informed patient that diabetes coordinator will be following along while inpatient. Encouraged patient to continue to follow up with Dr. Sharl Ma and to take medications as prescribed. Also encouraged patient to get a more affordable glucometer (Reli-On) at [facility] so he can afford to monitor glucose more often.  Patient verbalized understanding of information discussed and he states that he has no further questions at this time related to diabetes.   Thanks, Orlando Penner, RN, MSN, CCRN, CDE Diabetes Coordinator Inpatient Diabetes Program [phone] (Team Pager) [phone] ([facility] office) [phone] [facility] office) [phone] [facility] office)

## 2014-09-23 ENCOUNTER — Encounter: Payer: Self-pay | Admitting: *Deleted

## 2014-09-23 DIAGNOSIS — I10 Essential (primary) hypertension: Secondary | ICD-10-CM

## 2014-09-23 DIAGNOSIS — N390 Urinary tract infection, site not specified: Secondary | ICD-10-CM | POA: Diagnosis present

## 2014-09-23 DIAGNOSIS — N3000 Acute cystitis without hematuria: Secondary | ICD-10-CM

## 2014-09-23 LAB — GLUCOSE, CAPILLARY
GLUCOSE-CAPILLARY: 92 mg/dL (ref 65–99)
GLUCOSE-CAPILLARY: 200 mg/dL — AB (ref 65–99)

## 2014-09-23 LAB — HEMOGLOBIN A1C
HEMOGLOBIN A1C: 7.2 % — AB (ref 4.8–5.6)
MEAN PLASMA GLUCOSE: 160 mg/dL

## 2014-09-23 MED ORDER — CEFUROXIME AXETIL 500 MG PO TABS: 500.0000 mg | ORAL_TABLET | Freq: Two times a day (BID) | ORAL | Status: DC

## 2014-09-23 MED ORDER — DOXYCYCLINE HYCLATE 100 MG PO TABS: 100.0000 mg | ORAL_TABLET | Freq: Two times a day (BID) | ORAL | Status: DC

## 2014-09-23 NOTE — Progress Notes (Signed)
Went over with patient regarding appointment Friday 09/25/14 With Dr Jettie Booze at 8:30 Aorta-gram and that he soul be NPO after midnight the 9/22 the evening prior patient understood and repeated instructions back to me

## 2014-09-23 NOTE — Discharge Summary (Signed)
Physician Discharge Summary  [name]:[id number] DOB: September 13, 1955 DOA: 09/21/2014  PCP: [name], MD  Admit date: 09/21/2014 Discharge date: 09/23/2014  Time spent: 40 minutes  Recommendations for Outpatient Follow-up:  1. Follow-up with vascular surgery on Friday morning for aortogram.  Discharge Diagnoses:  Principal Problem:   Infected open wound Active Problems:   Hyperlipidemia   TOBACCO ABUSE   SYSTOLIC HEART FAILURE, CHRONIC   Cerebrovascular disease   Peripheral vascular disease   Hypertension   Automatic implantable cardioverter-defibrillator in [employer]   Type 2 diabetes mellitus   UTI (urinary tract infection)   Discharge Condition: Stable  Diet recommendation: Heart healthy  Filed Weights   09/23/14 0500  Weight: 96.5 kg (212 lb 11.9 oz)    History of present illness:  [name] is a 59 y.o. male with Past medical history of peripheral vascular disease, ICD implantation, chronic systolic heart failure, coronary artery disease, CVA, diabetes mellitus, active smoker. The patient is presenting with complaints of wound infection. The patient presented with complaining of discharge from his wound. He has also noted maggots crawling in the wound. He mentions that he has 3 separate open ulcers which has been chronically present and slowly healing. The patient has been applying it over-the-counter wound care. He has noted there was a big scab on one of the ulcer on the left foot. While he removed the scab he noted that there were made gets and out of concern is that to come to the hospital. He denies any complaints of fever, chills, chest pain, shortness of breath, cough, dizziness, lightheadedness, abdominal pain, diarrhea, constipation, nausea, vomiting, focal deficit. He denies any significant pain in his leg as well. He mentions he is compliant with all his medications other than aspirin which he takes on and off  The patient is coming from  home.  At his baseline ambulates without any support And is independent for most of his ADL; manages his medication on his own.   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 The nonhealing wound has a appearance of a vascular ulcer, please look at the photo pillow Started on broad-spectrum antibiotics. Had previous right-sided femoropopliteal bypass. Patient seen by vascular surgery and recommended aortogram to be done on Friday, patient is okayed to be discharged from vascular standpoint to come back on Friday morning for his procedure. Patient discharged on doxycycline and Ceftin for both cellulitis and UTI.  UTI Urinalysis consistent with UTI, patient is diabetic. This is considered complicated UTI as he is a male and diabetic, Ceftin for total of 7 days.  Hyperlipidemia Continue simvastatin.  SYSTOLIC HEART FAILURE, CHRONIC Continuing his torsemide as well as Aldactone at home doses. Also continue Coreg.  Cerebrovascular disease  Peripheral vascular disease Continuing aspirin. Requested patient to continue taking aspirin on a regular basis to avoid any thrombotic event.  Hypertension Blood pressure is stable to continue home medications.  Automatic implantable cardioverter-defibrillator in [employer] Continue close monitoring.  Type 2 diabetes mellitus Slightly uncontrolled diabetes mellitus type 2 with hemoglobin A1c of 7.2. Very improved control from November 2015 as hemoglobin A1c was 12.4 back then.   Procedures:  None  Consultations:  Vascular surgery  Discharge Exam: Filed Vitals:   09/23/14 0609  BP: 93/61  Pulse: 71  Temp: 98.5 F (36.9 C)  Resp: 16   General: Alert and awake, oriented x3, not in any acute distress. HEENT: anicteric sclera,  pupils reactive to light and accommodation, EOMI CVS: S1-S2 clear, no murmur rubs or gallops Chest: clear to auscultation  bilaterally, no wheezing, rales or rhonchi Abdomen: soft nontender, nondistended, normal bowel sounds, no organomegaly Extremities: no cyanosis, clubbing or edema noted bilaterally Neuro: Cranial nerves II-XII intact, no focal neurological deficits      Discharge Instructions   Discharge Instructions    Diet - low sodium heart healthy    Complete by:  As directed      Increase activity slowly    Complete by:  As directed           Current Discharge Medication List    START taking these medications   Details  cefUROXime (CEFTIN) 500 MG tablet Take 1 tablet (500 mg total) by mouth 2 (two) times daily with a meal. Qty: 14 tablet, Refills: 0    doxycycline (VIBRA-TABS) 100 MG tablet Take 1 tablet (100 mg total) by mouth 2 (two) times daily. Qty: 14 tablet, Refills: 0      CONTINUE these medications which have NOT CHANGED   Details  aspirin 81 MG tablet Take 1 tablet (81 mg total) by mouth daily. Qty: 30 tablet, Refills: 3    atorvastatin (LIPITOR) 80 MG tablet Take 1 tablet (80 mg total) by mouth every other day. Qty: 30 tablet, Refills: 11   Associated Diagnoses: Hyperlipidemia; Atherosclerosis of native coronary artery of native heart without angina pectoris; Tobacco use disorder; Chronic systolic heart failure; Peripheral vascular disease; Cerebrovascular disease; Essential hypertension; Automatic implantable cardioverter-defibrillator in situ    benazepril (LOTENSIN) 5 MG tablet Take 1 tablet (5 mg total) by mouth daily. Qty: 30 tablet, Refills: 6    carvedilol (COREG) 6.25 MG tablet Take 1 tablet (6.25 mg total) by mouth 2 (two) times daily with a meal. Qty: 60 tablet, Refills: 2    ezetimibe (ZETIA) 10 MG tablet Take 1 tablet (10 mg total) by mouth daily. Qty: 30 tablet, Refills: 6    insulin NPH Human (HUMULIN N,NOVOLIN N) 100 UNIT/ML injection Inject 10 Units into the skin daily.     insulin NPH-regular Human (NOVOLIN 70/30) (70-30) 100 UNIT/ML injection Inject  16-20 Units into the skin 2 (two) times daily with a meal. 20 units in the morning, 16 units in the evening    JARDIANCE 25 MG TABS tablet Take 25 mg by mouth daily.    metFORMIN (GLUCOPHAGE) 500 MG tablet Take 500 mg by mouth daily with breakfast.   Associated Diagnoses: Hyperlipidemia; Atherosclerosis of native coronary artery of native heart without angina pectoris; Tobacco use disorder; Chronic systolic heart failure; Peripheral vascular disease; Cerebrovascular disease; Essential hypertension; Automatic implantable cardioverter-defibrillator in situ    spironolactone (ALDACTONE) 25 MG tablet TAKE ONE-HALF TABLET BY MOUTH ONCE DAILY Qty: 30 tablet, Refills: 0    torsemide (DEMADEX) 20 MG tablet Take 3 tablets (60 mg total) by mouth once. Qty: 90 tablet, Refills: 11   Associated Diagnoses: Hyperlipidemia; Atherosclerosis of native coronary artery of native heart without angina pectoris; Tobacco use disorder; Chronic systolic heart failure; Peripheral vascular disease; Cerebrovascular disease; Essential hypertension; Automatic implantable cardioverter-defibrillator in situ       Allergies  Allergen Reactions  . Niacin Itching and Other (See Comments)    Flushing   . Metformin And Related     Vomiting    Follow-up Information    Follow up with [name], MD In 1 week.   Specialty:  Family Medicine   Contact information:   [address]  [place] [address] [phone]        The results of significant diagnostics from this hospitalization (including imaging, microbiology, ancillary and laboratory) are listed below for reference.    Significant Diagnostic Studies: Dg Tibia/fibula Left Port  09/22/2014   CLINICAL DATA:  Wounds of the left mid anterior tib-fib. Maggots in wound.  EXAM: PORTABLE LEFT TIBIA AND FIBULA - 2 VIEW  COMPARISON:  None.  FINDINGS: There is soft tissue defect at the mid anterior lower leg. No associated fracture or bone defect. Gastric  calcifications are noted.  IMPRESSION: 1. No acute fracture. 2. Soft tissue defect of the mid anterior shin.   Electronically Signed   By: Norva Pavlov M.D.   On: 09/22/2014 00:06    Microbiology: Recent Results (from the past 240 hour(s))  Culture, blood (routine x 2)     Status: None (Preliminary result)   Collection Time: 09/21/14  9:36 PM  Result Value Ref Range Status   Specimen Description BLOOD LEFT FOREARM  Final   Special Requests BOTTLES DRAWN AEROBIC AND ANAEROBIC  Final   Culture NO GROWTH < 24 HOURS  Final   Report Status PENDING  Incomplete  Culture, blood (routine x 2)     Status: None (Preliminary result)   Collection Time: 09/21/14 10:26 PM  Result Value Ref Range Status   Specimen Description BLOOD RIGHT ANTECUBITAL  Final   Special Requests BOTTLES DRAWN AEROBIC AND ANAEROBIC 10CC  Final   Culture NO GROWTH < 24 HOURS  Final   Report Status PENDING  Incomplete  Surgical pcr screen     Status: Abnormal   Collection Time: 09/22/14  9:53 AM  Result Value Ref Range Status   MRSA, PCR NEGATIVE NEGATIVE Final   Staphylococcus aureus POSITIVE (A) NEGATIVE Final    Comment:        The Xpert SA Assay (FDA approved for NASAL specimens in patients over 95 years of age), is one component of a comprehensive surveillance program.  Test performance has been validated by [facility] for patients greater than or equal to 17 year old. It is not intended to diagnose infection nor to guide or monitor treatment.      Labs: Basic Metabolic Panel:  Recent Labs Lab 09/21/14 2135 09/22/14 0539  NA 137 138  K 3.7 4.2  CL 101 105  CO2 26 26  GLUCOSE 75 120*  BUN 12 13  CREATININE 0.91 0.93  CALCIUM 8.9 8.5*   Liver Function Tests:  Recent Labs Lab 09/21/14 2135  AST 41  ALT 41  ALKPHOS 153*  BILITOT 1.1  PROT 7.8  ALBUMIN 3.4*   No results for input(s): LIPASE, AMYLASE in the last 168 hours. No results for input(s): AMMONIA in the last 168  hours. CBC:  Recent Labs Lab 09/21/14 2135 09/22/14 0539  WBC 8.1 6.0  NEUTROABS 6.2 4.1  HGB 14.0 12.6*  HCT 43.7 40.6  MCV 93.0 93.1  PLT 130* 136*   Cardiac Enzymes: No results for input(s): CKTOTAL, CKMB, CKMBINDEX, TROPONINI in the last 168 hours. BNP: BNP (last 3 results) No results for input(s): BNP in the last 8760 hours.  ProBNP (last 3 results) No results for input(s): PROBNP in the last 8760 hours.  CBG:  Recent Labs Lab 09/22/14 1154 09/22/14 1607 09/22/14 1800 09/22/14 2245 09/23/14 0611  GLUCAP 100* 211* 220* 155* 92       Signed:  ELMAHI,MUTAZ A  [facility] 09/23/2014, 11:25 AM

## 2014-09-24 ENCOUNTER — Other Ambulatory Visit: Payer: Self-pay | Admitting: *Deleted

## 2014-09-24 LAB — URINE CULTURE: CULTURE: NO GROWTH

## 2014-09-25 ENCOUNTER — Encounter (HOSPITAL_COMMUNITY)
Admission: RE | Disposition: A | Discharge status: Home / Self Care | Payer: Self-pay | Source: Ambulatory Visit | Attending: Vascular Surgery

## 2014-09-25 ENCOUNTER — Telehealth: Payer: Self-pay | Admitting: Vascular Surgery

## 2014-09-25 ENCOUNTER — Ambulatory Visit (HOSPITAL_COMMUNITY)
Admission: RE | Admit: 2014-09-25 | Discharge: 2014-09-25 | Disposition: A | Discharge status: Home / Self Care | Payer: BLUE CROSS/BLUE SHIELD | Source: Ambulatory Visit | Attending: Vascular Surgery | Admitting: Vascular Surgery

## 2014-09-25 DIAGNOSIS — Z95810 Presence of automatic (implantable) cardiac defibrillator: Secondary | ICD-10-CM | POA: Insufficient documentation

## 2014-09-25 DIAGNOSIS — I2510 Atherosclerotic heart disease of native coronary artery without angina pectoris: Secondary | ICD-10-CM | POA: Insufficient documentation

## 2014-09-25 DIAGNOSIS — I10 Essential (primary) hypertension: Secondary | ICD-10-CM | POA: Diagnosis not present

## 2014-09-25 DIAGNOSIS — Z955 Presence of coronary angioplasty implant and graft: Secondary | ICD-10-CM | POA: Diagnosis not present

## 2014-09-25 DIAGNOSIS — L97329 Non-pressure chronic ulcer of left ankle with unspecified severity: Secondary | ICD-10-CM | POA: Insufficient documentation

## 2014-09-25 DIAGNOSIS — Z7982 Long term (current) use of aspirin: Secondary | ICD-10-CM | POA: Insufficient documentation

## 2014-09-25 DIAGNOSIS — I70232 Atherosclerosis of native arteries of right leg with ulceration of calf: Secondary | ICD-10-CM | POA: Diagnosis not present

## 2014-09-25 DIAGNOSIS — E785 Hyperlipidemia, unspecified: Secondary | ICD-10-CM | POA: Insufficient documentation

## 2014-09-25 DIAGNOSIS — Z794 Long term (current) use of insulin: Secondary | ICD-10-CM | POA: Diagnosis not present

## 2014-09-25 DIAGNOSIS — E1151 Type 2 diabetes mellitus with diabetic peripheral angiopathy without gangrene: Secondary | ICD-10-CM | POA: Diagnosis not present

## 2014-09-25 DIAGNOSIS — I6523 Occlusion and stenosis of bilateral carotid arteries: Secondary | ICD-10-CM | POA: Diagnosis not present

## 2014-09-25 DIAGNOSIS — F17210 Nicotine dependence, cigarettes, uncomplicated: Secondary | ICD-10-CM | POA: Insufficient documentation

## 2014-09-25 DIAGNOSIS — I739 Peripheral vascular disease, unspecified: Secondary | ICD-10-CM | POA: Diagnosis present

## 2014-09-25 DIAGNOSIS — I255 Ischemic cardiomyopathy: Secondary | ICD-10-CM | POA: Insufficient documentation

## 2014-09-25 DIAGNOSIS — I5022 Chronic systolic (congestive) heart failure: Secondary | ICD-10-CM | POA: Diagnosis not present

## 2014-09-25 HISTORY — PX: PERIPHERAL VASCULAR CATHETERIZATION: SHX172C

## 2014-09-25 LAB — GLUCOSE, CAPILLARY
GLUCOSE-CAPILLARY: 99 mg/dL (ref 65–99)
GLUCOSE-CAPILLARY: 55 mg/dL — AB (ref 65–99)
GLUCOSE-CAPILLARY: 93 mg/dL (ref 65–99)
Glucose-Capillary: 55 mg/dL — ABNORMAL LOW (ref 65–99)

## 2014-09-25 LAB — POCT I-STAT, CHEM 8
BUN: 21 mg/dL — ABNORMAL HIGH (ref 6–20)
CREATININE: 0.9 mg/dL (ref 0.61–1.24)
Calcium, Ion: 1.09 mmol/L — ABNORMAL LOW (ref 1.12–1.23)
Chloride: 95 mmol/L — ABNORMAL LOW (ref 101–111)
GLUCOSE: 57 mg/dL — AB (ref 65–99)
HEMATOCRIT: 46 % (ref 39.0–52.0)
HEMOGLOBIN: 15.6 g/dL (ref 13.0–17.0)
Potassium: 3.6 mmol/L (ref 3.5–5.1)
Sodium: 139 mmol/L (ref 135–145)
TCO2: 29 mmol/L (ref 0–100)

## 2014-09-25 LAB — POCT ACTIVATED CLOTTING TIME
ACTIVATED CLOTTING TIME: 202 s
ACTIVATED CLOTTING TIME: 159 s

## 2014-09-25 SURGERY — ABDOMINAL AORTOGRAM
Anesthesia: LOCAL

## 2014-09-25 MED ORDER — LIDOCAINE HCL (PF) 1 % IJ SOLN
INTRAMUSCULAR | Status: DC | PRN
  Administered 2014-09-25: 11:00:00

## 2014-09-25 MED ORDER — ONDANSETRON HCL 4 MG/2ML IJ SOLN: 4.0000 mg | Freq: Four times a day (QID) | INTRAMUSCULAR | Status: DC | PRN

## 2014-09-25 MED ORDER — DEXTROSE 50 % IV SOLN
INTRAVENOUS | Status: AC
  Administered 2014-09-25: 25 mL
  Filled 2014-09-25: qty 50

## 2014-09-25 MED ORDER — OXYCODONE HCL 5 MG PO TABS: 5.0000 mg | ORAL_TABLET | ORAL | Status: DC | PRN

## 2014-09-25 MED ORDER — MAGNESIUM SULFATE 2 GM/50ML IV SOLN
2.0000 g | Freq: Every day | INTRAVENOUS | Status: DC | PRN
  Filled 2014-09-25: qty 50

## 2014-09-25 MED ORDER — HEPARIN SODIUM (PORCINE) 1000 UNIT/ML IJ SOLN
INTRAMUSCULAR | Status: DC | PRN
  Administered 2014-09-25: 5000 [IU] via INTRAVENOUS

## 2014-09-25 MED ORDER — LABETALOL HCL 5 MG/ML IV SOLN: 10.0000 mg | INTRAVENOUS | Status: DC | PRN

## 2014-09-25 MED ORDER — METOPROLOL TARTRATE 1 MG/ML IV SOLN: 2.0000 mg | INTRAVENOUS | Status: DC | PRN

## 2014-09-25 MED ORDER — ACETAMINOPHEN 325 MG PO TABS
325.0000 mg | ORAL_TABLET | ORAL | Status: DC | PRN
  Filled 2014-09-25: qty 2

## 2014-09-25 MED ORDER — SODIUM CHLORIDE 0.9 % IV SOLN
INTRAVENOUS | Status: DC
  Administered 2014-09-25: 09:00:00 via INTRAVENOUS

## 2014-09-25 MED ORDER — SODIUM CHLORIDE 0.45 % IV SOLN
INTRAVENOUS | Status: DC
  Administered 2014-09-25: 13:00:00 via INTRAVENOUS

## 2014-09-25 MED ORDER — LIDOCAINE HCL (PF) 1 % IJ SOLN
INTRAMUSCULAR | Status: AC
  Filled 2014-09-25: qty 30

## 2014-09-25 MED ORDER — MORPHINE SULFATE (PF) 10 MG/ML IV SOLN: 2.0000 mg | INTRAVENOUS | Status: DC | PRN

## 2014-09-25 MED ORDER — HEPARIN (PORCINE) IN NACL 2-0.9 UNIT/ML-% IJ SOLN
INTRAMUSCULAR | Status: AC
  Filled 2014-09-25: qty 1000

## 2014-09-25 MED ORDER — HYDRALAZINE HCL 20 MG/ML IJ SOLN: 5.0000 mg | INTRAMUSCULAR | Status: DC | PRN

## 2014-09-25 MED ORDER — CEFUROXIME SODIUM 1.5 G IJ SOLR
1.5000 g | Freq: Two times a day (BID) | INTRAMUSCULAR | Status: DC
Start: 2014-09-25 — End: 2014-09-25

## 2014-09-25 MED ORDER — SODIUM CHLORIDE 0.9 % IV SOLN: 500.0000 mL | Freq: Once | INTRAVENOUS | Status: DC | PRN

## 2014-09-25 MED ORDER — ACETAMINOPHEN 325 MG RE SUPP
325.0000 mg | RECTAL | Status: DC | PRN
  Filled 2014-09-25: qty 2

## 2014-09-25 MED ORDER — POTASSIUM CHLORIDE CRYS ER 20 MEQ PO TBCR
20.0000 meq | EXTENDED_RELEASE_TABLET | Freq: Every day | ORAL | Status: DC | PRN
  Filled 2014-09-25: qty 2

## 2014-09-25 MED ORDER — HEPARIN SODIUM (PORCINE) 1000 UNIT/ML IJ SOLN
INTRAMUSCULAR | Status: AC
  Filled 2014-09-25: qty 1

## 2014-09-25 SURGICAL SUPPLY — 14 items
BALLOON POWERFLX PRO 7X40X80 (BALLOONS) ×1 IMPLANT
CATH ANGIO 5F PIGTAIL 65CM (CATHETERS) ×1 IMPLANT
COVER PRB 48X5XTLSCP FOLD TPE (BAG) IMPLANT
COVER PROBE 5X48 (BAG) ×2
KIT ENCORE 26 ADVANTAGE (KITS) ×1 IMPLANT
KIT PV (KITS) ×2 IMPLANT
SHEATH BRITE TIP 7FR 35CM (SHEATH) ×1 IMPLANT
SHEATH PINNACLE 5F 10CM (SHEATH) ×1 IMPLANT
STENT ABSOLUTE PRO 8X40X135 (Permanent Stent) ×1 IMPLANT
SYR MEDRAD MARK V 150ML (SYRINGE) ×2 IMPLANT
TAPE RADIOPAQUE TURBO (MISCELLANEOUS) ×1 IMPLANT
TRANSDUCER W/STOPCOCK (MISCELLANEOUS) ×2 IMPLANT
TRAY PV CATH (CUSTOM PROCEDURE TRAY) ×2 IMPLANT
WIRE HITORQ VERSACORE ST 145CM (WIRE) ×1 IMPLANT

## 2014-09-25 NOTE — Progress Notes (Signed)
52fr sheath removed from right femoral artery. Manual pressure held for twenty minutes. Hemostasis obtained and bandage applied. Dressing clean, dry and intact. Instructions given to patient. Bed rest begins 12:40pm.

## 2014-09-25 NOTE — Discharge Instructions (Signed)

## 2014-09-25 NOTE — Progress Notes (Signed)
Hypoglycemic Event  CBG: 57  Treatment: D50 IV 25 mL  Symptoms: None  Follow-up CBG: Time0930 CBG Result: 99  Possible Reasons for Event: Change in activity NPO  Comments/MD notified:    Ty Hilts  Remember to initiate Hypoglycemia Order Set & complete

## 2014-09-25 NOTE — Telephone Encounter (Signed)
-----   Message from Chuck Hint, MD sent at 09/25/2014 12:55 PM EDT ----- Regarding: office visit He will need an office visit with vein mapping of the left leg to discuss left femoropopliteal bypass grafting. Thank you. CD

## 2014-09-25 NOTE — H&P (View-Only) (Signed)
Hospital Consult    Reason for Consult:  Non healing wound left leg Referring Physician:  Arthor Captain MRN #:  [id number]  History of Present Illness: This is a 59 y.o. male who is a pt of Dr. Edilia Bo and underwent a right CFA to below knee popliteal artery bypass with vein graft on 11/07/13 for non healing wounds right foot.  These wounds had healed.  He was seen back in the office in May 2015.  At that time, he did not have significant rest pain or claudication except for some mild left calf claudication.  At that time, he did have wounds on the LLE but stated that the wound overlying his lateral malleolus had been present for a year. He was supposed to f/u with Dr. Edilia Bo in 6 months with ABIs and if these wounds had not healed, (based on previous angiogram 11/06/13) he would be a candidate for a left femoral to above knee popliteal bypass.  He had essentially peroneal runoff on the left.  ABI's May 27, 2014 are as follows: Right:  1.2 Left:  0.71  TBI's May 27, 2014: Right:  0.55 Left:  0.18  He is followed by Dr. Eden Emms for carotid stenosis and his most recent carotid duplex revealed 60-79% stenosis on the right and 40-59% stenosis on the left.  The vertebral arteries were patent with antegrade flow.  He presented to the hospital yesterday with c/o on non healing wounds on the left foot.  The pt states there were maggots present in the wound.    The pt is on a statin for cholesterol management.  He is on a beta blocker and ACEI for blood pressure management.  He is on insulin for his diabetes.   Past Medical History  Diagnosis Date  . Hyperlipidemia     mixed  . Chronic systolic heart failure   . ICD (implantable cardiac defibrillator), dual, in situ     St. Jude for severe LVD EF 25% 2/07 explanted 2010. Medtronic Virtuoso II DR Dual-chamber cardiverter-defibrillator  with pocket revision, Dr. Graciela Husbands  . Tobacco abuse   . Ischemic cardiomyopathy     a. Echo (09/27/12): EF 20%, diffuse  HK with periapical AK, no LV thrombus noted, restrictive physiology, trivial MR, mild LAE, RVSF mildly reduced, PASP 39  . Cerebrovascular disease   . Diabetes mellitus   . HTN (hypertension)   . PVD (peripheral vascular disease)     a. ABI 09/2011 - R normal, L moderate - saw Dr. Kirke Corin - med rx.   . Carotid artery disease     a. Dopp 09/2011: 60-79% RICA, 40-59% LICA.;  b.  Carotid US (7/14):  Bilateral 60-79% => f/u 6 mos  . Coronary artery disease     a. AWMI requiring IABP 2005 s/p Horizon study stent-LAD, staged BMS-Cx, DESx2-RCA. b. Last Last LHC (6/06):  EF 25%, pLAD stent 40-50, after stent 40, dLAD 20, pCFX 20, pOM1 70, pRCA 20, mRCA stents ok.=> med Rx;  c.  Myoview (7/14):  EF 26%, large ant, septal, inf and apical infarct, no ischemia.  Med Rx continued  . RIATA ICD Lead--on advisory recall      Past Surgical History  Procedure Laterality Date  . Medtronic virtuoso ii dr dual-chamber cardioverter-defibrillation with pocket revison      Dr. Sherryl Manges  . Femoral-popliteal bypass graft Right 11/07/2013    Procedure: Right FEMORAL-POPLITEAL ARTERY Bypass ;  Surgeon: Chuck Hint, MD;  Location: [facility] OR;  Service: Vascular;  Laterality:  Right;  . Intraoperative arteriogram Right 11/07/2013    Procedure: INTRA OPERATIVE ARTERIOGRAM;  Surgeon: Chuck Hint, MD;  Location: [facility] OR;  Service: Vascular;  Laterality: Right;  . Implantable cardioverter defibrillator (icd) generator change N/A 12/16/2012    Procedure: ICD GENERATOR CHANGE;  Surgeon: [name], MD;  Location: [facility] CATH LAB;  Service: Cardiovascular;  Laterality: N/A;  . Abdominal aortagram N/A 11/06/2013    Procedure: ABDOMINAL AORTAGRAM;  Surgeon: Chuck Hint, MD;  Location: [facility] CATH LAB;  Service: Cardiovascular;  Laterality: N/A;  . Lower extremity angiogram Bilateral 11/06/2013    Procedure: LOWER EXTREMITY ANGIOGRAM;  Surgeon: Chuck Hint, MD;  Location: [facility] CATH LAB;  Service:  Cardiovascular;  Laterality: Bilateral;    Allergies  Allergen Reactions  . Niacin Itching and Other (See Comments)    Flushing   . Metformin And Related     Vomiting     Prior to Admission medications   Medication Sig Start Date End Date Taking? Authorizing Provider  aspirin 81 MG tablet Take 1 tablet (81 mg total) by mouth daily. 10/07/12  Yes [name], MD  atorvastatin (LIPITOR) 80 MG tablet Take 1 tablet (80 mg total) by mouth every other day. 08/20/14  Yes Lewayne Bunting, MD  benazepril (LOTENSIN) 5 MG tablet Take 1 tablet (5 mg total) by mouth daily. 06/19/14  Yes Lewayne Bunting, MD  carvedilol (COREG) 6.25 MG tablet Take 1 tablet (6.25 mg total) by mouth 2 (two) times daily with a meal. Patient taking differently: Take 3.125 mg by mouth 2 (two) times daily with a meal.  11/12/13  Yes Richarda Overlie, MD  ezetimibe (ZETIA) 10 MG tablet Take 1 tablet (10 mg total) by mouth daily. 01/08/14  Yes [name], MD  insulin NPH Human (HUMULIN N,NOVOLIN N) 100 UNIT/ML injection Inject 10 Units into the skin daily.    Yes Historical Provider, MD  insulin NPH-regular Human (NOVOLIN 70/30) (70-30) 100 UNIT/ML injection Inject 16-20 Units into the skin 2 (two) times daily with a meal. 20 units in the morning, 16 units in the evening   Yes Historical Provider, MD  JARDIANCE 25 MG TABS tablet Take 25 mg by mouth daily. 03/02/14  Yes Historical Provider, MD  metFORMIN (GLUCOPHAGE) 500 MG tablet Take 500 mg by mouth daily with breakfast.   Yes Historical Provider, MD  spironolactone (ALDACTONE) 25 MG tablet TAKE ONE-HALF TABLET BY MOUTH ONCE DAILY 08/28/14  Yes Dolores Patty, MD  torsemide (DEMADEX) 20 MG tablet Take 3 tablets (60 mg total) by mouth once. Patient taking differently: Take 60 mg by mouth daily.  08/20/14  Yes Lewayne Bunting, MD    Social History   Social History  . Marital Status: Single    Spouse Name: N/A  . Number of Children: N/A  . Years of Education: N/A    Occupational History  . [employer]    Full time   Social History Main Topics  . Smoking status: Current Some Day Smoker -- 1.00 packs/day for 33 years    Types: Cigarettes  . Smokeless tobacco: Never Used     Comment: smoker for 33 years  . Alcohol Use: No  . Drug Use: No     Comment: former Cocain, Acid, Marijuana - 25 years ago  . Sexual Activity: No   Other Topics Concern  . Not on file   Social History Narrative   Divorced.     Family History  Problem  Relation Age of Onset  . Diabetes Mother   . Coronary artery disease Father     ROS: [x]  Positive   [ ]  Negative   [ ]  All sytems reviewed and are negative  Cardiovascular: []  chest pain/pressure []  palpitations []  SOB lying flat []  DOE []  pain in legs while walking []  pain in legs at rest []  pain in legs at night [x]  non-healing ulcers with maggots []  hx of DVT []  swelling in legs [x]  ICD (EF 20-25%)  Pulmonary: []  productive cough []  asthma/wheezing []  home O2  Neurologic: []  weakness in []  arms []  legs []  numbness in []  arms []  legs [x]  hx of CVA []  mini stroke [x]  hx carotid artery stenosis Right > Left [] difficulty speaking or slurred speech []  temporary loss of vision in one eye []  dizziness  Hematologic: []  hx of cancer []  bleeding problems []  problems with blood clotting easily  Endocrine:   [x]  diabetes []  thyroid disease  GI []  vomiting blood []  blood in stool  GU: []  CKD/renal failure []  HD--[]  M/W/F or []  T/T/S []  burning with urination []  blood in urine  Psychiatric: []  anxiety []  depression  Musculoskeletal: []  arthritis []  joint pain  Integumentary: []  rashes [x]  ulcers  Constitutional: []  fever []  chills   Physical Examination  Filed Vitals:   09/22/14 1404  BP: 106/68  Pulse: 75  Temp: 98.6 F (37 C)  Resp: 18   There is no weight on file to calculate BMI.  General:  WDWN in NAD Gait: Not observed HENT: WNL, normocephalic Pulmonary: normal  non-labored breathing Skin: without rashes, with ulcers on the left lower extremity Vascular Exam/Pulses: Unable to palpate pedal pulses Extremities:  Most proximal wound on LLE had 2 maggots within the wound.  There are 3 non healing wounds on the anterior shin of the left leg; chronic skin changes below the knees bilaterally  Musculoskeletal: no muscle wasting or atrophy  Neurologic: A&O X 3; Appropriate Affect ; SENSATION: normal; MOTOR FUNCTION:  moving all extremities equally. Speech is fluent/normal   CBC    Component Value Date/Time   WBC 6.0 09/22/2014 0539   RBC 4.36 09/22/2014 0539   HGB 12.6* 09/22/2014 0539   HCT 40.6 09/22/2014 0539   PLT 136* 09/22/2014 0539   MCV 93.1 09/22/2014 0539   MCH 28.9 09/22/2014 0539   MCHC 31.0 09/22/2014 0539   RDW 16.5* 09/22/2014 0539   LYMPHSABS 1.2 09/22/2014 0539   MONOABS 0.5 09/22/2014 0539   EOSABS 0.1 09/22/2014 0539   BASOSABS 0.0 09/22/2014 0539    BMET    Component Value Date/Time   NA 138 09/22/2014 0539   K 4.2 09/22/2014 0539   CL 105 09/22/2014 0539   CO2 26 09/22/2014 0539   GLUCOSE 120* 09/22/2014 0539   BUN 13 09/22/2014 0539   CREATININE 0.93 09/22/2014 0539   CREATININE 0.80 01/12/2014 0848   CALCIUM 8.5* 09/22/2014 0539   GFRNONAA >60 09/22/2014 0539   GFRNONAA >89 01/12/2014 0848   GFRAA >60 09/22/2014 0539   GFRAA >89 01/12/2014 0848    COAGS: No results found for: INR, PROTIME   Non-Invasive Vascular Imaging:   ABI's May 27, 2014 are as follows: Right:  1.2 Left:  0.71  TBI's May 27, 2014: Right:  0.55 Left:  0.18  Statin:  Yes.   Beta Blocker:  Yes.   Aspirin:  Yes.   ACEI:  Yes.   ARB:  No. Other antiplatelets/anticoagulants:  No.    ASSESSMENT/PLAN:  This is a 59 y.o. male with PAD and non healing wounds and maggots present left lower extremity   -pt continues to have non healing wounds on the LLE  -given his aortogram was almost a year ago, he will need another aortogram with  runoff to best determine further management. -will order ABI's for tomorrow -will schedule this for Friday by Dr. Bynum Bellows, PA-C [facility] [phone]   History and exam details as above.  Pt has several scattered ulcers left leg which are chronic.  It has been several months since his arteriogram so this will need repeating prior to proceeding with bypass.  Continue local wound care for left leg.  Will get ABI as well to check current perfusion status.  Pt can be dc'd home and return Friday for agram from my standpoint.  Will inform Dr Edilia Bo of current pt situation  Fabienne Bruns, MD [facility] [place] Office: [phone] Pager: [phone]

## 2014-09-25 NOTE — Telephone Encounter (Signed)
LM for pt re appt, dpm °

## 2014-09-25 NOTE — Interval H&P Note (Signed)
History and Physical Interval Note:  09/25/2014 9:41 AM  [name]  has presented today for surgery, with the diagnosis of pad  The various methods of treatment have been discussed with the patient and family. After consideration of risks, benefits and other options for treatment, the patient has consented to  Procedure(s): Abdominal Aortogram (N/A) as a surgical intervention .  The patient's history has been reviewed, patient examined, no change in status, stable for surgery.  I have reviewed the patient's chart and labs.  Questions were answered to the patient's satisfaction.     Fabienne Bruns

## 2014-09-26 LAB — CULTURE, BLOOD (ROUTINE X 2)
CULTURE: NO GROWTH
Culture: NO GROWTH

## 2014-09-28 ENCOUNTER — Encounter (HOSPITAL_COMMUNITY): Payer: Self-pay | Admitting: Vascular Surgery

## 2014-10-05 ENCOUNTER — Other Ambulatory Visit: Payer: Self-pay | Admitting: *Deleted

## 2014-10-05 DIAGNOSIS — Z01810 Encounter for preprocedural cardiovascular examination: Secondary | ICD-10-CM

## 2014-10-05 DIAGNOSIS — I739 Peripheral vascular disease, unspecified: Secondary | ICD-10-CM

## 2014-10-07 ENCOUNTER — Other Ambulatory Visit: Payer: Self-pay

## 2014-10-07 ENCOUNTER — Encounter: Payer: Self-pay | Admitting: Vascular Surgery

## 2014-10-07 ENCOUNTER — Ambulatory Visit (INDEPENDENT_AMBULATORY_CARE_PROVIDER_SITE_OTHER): Payer: BLUE CROSS/BLUE SHIELD | Admitting: Vascular Surgery

## 2014-10-07 ENCOUNTER — Ambulatory Visit (HOSPITAL_COMMUNITY)
Admission: RE | Admit: 2014-10-07 | Discharge: 2014-10-07 | Disposition: A | Discharge status: Home / Self Care | Payer: BLUE CROSS/BLUE SHIELD | Source: Ambulatory Visit | Attending: Vascular Surgery | Admitting: Vascular Surgery

## 2014-10-07 VITALS — BP 124/77 | HR 95 | Ht 72.0 in | Wt 205.8 lb

## 2014-10-07 DIAGNOSIS — Z01810 Encounter for preprocedural cardiovascular examination: Secondary | ICD-10-CM | POA: Insufficient documentation

## 2014-10-07 DIAGNOSIS — I10 Essential (primary) hypertension: Secondary | ICD-10-CM | POA: Diagnosis not present

## 2014-10-07 DIAGNOSIS — E119 Type 2 diabetes mellitus without complications: Secondary | ICD-10-CM | POA: Diagnosis not present

## 2014-10-07 DIAGNOSIS — I2510 Atherosclerotic heart disease of native coronary artery without angina pectoris: Secondary | ICD-10-CM | POA: Insufficient documentation

## 2014-10-07 DIAGNOSIS — F17200 Nicotine dependence, unspecified, uncomplicated: Secondary | ICD-10-CM | POA: Insufficient documentation

## 2014-10-07 DIAGNOSIS — I739 Peripheral vascular disease, unspecified: Secondary | ICD-10-CM | POA: Diagnosis not present

## 2014-10-07 DIAGNOSIS — I7025 Atherosclerosis of native arteries of other extremities with ulceration: Secondary | ICD-10-CM | POA: Diagnosis not present

## 2014-10-07 DIAGNOSIS — E785 Hyperlipidemia, unspecified: Secondary | ICD-10-CM | POA: Insufficient documentation

## 2014-10-07 DIAGNOSIS — I872 Venous insufficiency (chronic) (peripheral): Secondary | ICD-10-CM

## 2014-10-07 NOTE — Progress Notes (Signed)
  [facility] [place]  Patient name: Dalton DavisMRN: [id number]: 02/02/1957Sex: male  REASON FOR VISIT: To discuss left femoropopliteal bypass grafting.  HPI: Josearmando D Sequeira is a 59 y.o. male, who is status post previous right femoral to below-knee pop to artery bypass with a vein graft in November 2015. The patient subsequently developed wounds on the right foot with a scab over his medial malleolus on the left and a small wound over his lateral malleolus. He underwent an arteriogram which shows a superficial femoral artery occlusion on the left. He also had angioplasty of an iliac stenosis on the right above his femoropopliteal bypass graft.  He has wounds and venous stasis disease of his left leg which have not changed significantly. I think the poor healing is related to his known arterial disease. He has no symptoms on the right side.  Past Medical History  Diagnosis Date  . Hyperlipidemia     mixed  . Chronic systolic heart failure (HCC)   . ICD (implantable cardiac defibrillator), dual, in situ     St. Jude for severe LVD EF 25% 2/07 explanted 2010. Medtronic Virtuoso II DR Dual-chamber cardiverter-defibrillator with pocket revision, Dr. Klein  . Tobacco abuse   . Ischemic cardiomyopathy     a. Echo (09/27/12): EF 20%, diffuse HK with periapical AK, no LV thrombus noted, restrictive physiology, trivial MR, mild LAE, RVSF mildly reduced, PASP 39  . Cerebrovascular disease   . Diabetes mellitus   . HTN (hypertension)   . PVD (peripheral vascular disease) (HCC)     a. ABI 09/2011 - R normal, L moderate - saw Dr. Arida - med rx.   . Carotid artery disease (HCC)     a. Dopp 09/2011: 60-79% RICA, 40-59% LICA.; b. Carotid US (7/14): Bilateral 60-79% => f/u 6 mos  . Coronary artery disease     a. AWMI requiring IABP 2005 s/p Horizon study stent-LAD, staged BMS-Cx, DESx2-RCA. b. Last Last  LHC (6/06): EF 25%, pLAD stent 40-50, after stent 40, dLAD 20, pCFX 20, pOM1 70, pRCA 20, mRCA stents ok.=> med Rx; c. Myoview (7/14): EF 26%, large ant, septal, inf and apical infarct, no ischemia. Med Rx continued  . RIATA ICD Lead--on advisory recall    Family History  Problem Relation Age of Onset  . Diabetes Mother   . Coronary artery disease Father    SOCIAL HISTORY: Social History   Social History  . Marital Status: Single    Spouse Name: N/A  . Number of Children: N/A  . Years of Education: N/A   Occupational History  . [employer]    Full time   Social History Main Topics  . Smoking status: Current Some Day Smoker -- 1.00 packs/day for 33 years    Types: Cigarettes  . Smokeless tobacco: Never Used     Comment: smoker for 33 years  . Alcohol Use: No  . Drug Use: No     Comment: former Cocain, Acid, Marijuana - 25 years ago  . Sexual Activity: No   Other Topics Concern  . Not on file   Social History Narrative   Divorced.   Allergies  Allergen Reactions  . Niacin Itching and Other (See Comments)    Flushing   . Metformin And Related     Vomiting    Current Outpatient Prescriptions  Medication Sig Dispense Refill  . aspirin 81 MG tablet Take 1 tablet (81 mg total) by mouth daily. 30 tablet   tablet Take 1 tablet (81 mg total) by mouth daily. 30 tablet 3  . atorvastatin (LIPITOR) 80 MG tablet Take 1 tablet (80 mg total) by mouth every other day. 30 tablet 11  . benazepril (LOTENSIN) 5 MG tablet Take 1 tablet (5 mg total) by mouth daily. 30 tablet 6  . carvedilol (COREG) 6.25 MG tablet Take 1 tablet (6.25 mg total) by mouth 2 (two) times daily with a meal. (Patient taking differently: Take 3.125 mg by mouth 2 (two) times daily with a meal. ) 60 tablet 2  . cefUROXime (CEFTIN) 500 MG tablet Take 1 tablet (500 mg total) by mouth 2 (two) times daily with a meal. 14 tablet 0  . doxycycline (VIBRA-TABS) 100 MG tablet Take 1 tablet (100 mg total) by mouth 2 (two) times daily. 14 tablet 0  . ezetimibe (ZETIA) 10 MG tablet Take 1 tablet (10 mg total) by mouth daily. 30 tablet 6  . insulin NPH  Human (HUMULIN N,NOVOLIN N) 100 UNIT/ML injection Inject 10 Units into the skin daily.     . insulin NPH-regular Human (NOVOLIN 70/30) (70-30) 100 UNIT/ML injection Inject 16-20 Units into the skin 2 (two) times daily with a meal. 20 units in the morning, 16 units in the evening    . JARDIANCE 25 MG TABS tablet Take 25 mg by mouth daily.    . metFORMIN (GLUCOPHAGE) 500 MG tablet Take 500 mg by mouth daily with breakfast.    . spironolactone (ALDACTONE) 25 MG tablet TAKE ONE-HALF TABLET BY MOUTH ONCE DAILY 30 tablet 0  . torsemide (DEMADEX) 20 MG tablet Take 3 tablets (60 mg total) by mouth once. (Patient taking differently: Take 60 mg by mouth daily. ) 90 tablet 11   No current facility-administered medications for this visit.   REVIEW OF SYSTEMS:   denotes positive finding,  denotes negative finding Cardiac  Comments:  Chest pain or chest pressure:    Shortness of breath upon exertion:    Short of breath when lying flat:    Irregular heart rhythm:        Vascular    Pain in calf, thigh, or hip brought on by ambulation:    Pain in feet at night that wakes you up from your sleep:     Blood clot in your veins:    Leg swelling:         Pulmonary    Oxygen at home:    Productive cough:     Wheezing:         Neurologic    Sudden weakness in arms or legs:     Sudden numbness in arms or legs:     Sudden onset of difficulty speaking or slurred speech:    Temporary loss of vision in one eye:     Problems with dizziness:         Gastrointestinal    Blood in stool:     Vomited blood:         Genitourinary    Burning when urinating:     Blood in urine:        Psychiatric    Major depression:         Hematologic    Bleeding problems:    Problems with blood clotting too easily:        Skin    Rashes or ulcers:        Constitutional    Fever or chills:      PHYSICAL EXAM: Filed Vitals:  10/07/14 0907  BP: 124/77  Pulse: 95  Height: 6' (1.829 m)  Weight: 205 lb  12.8 oz (93.35 kg)  SpO2: 100%   GENERAL: The patient is a well-nourished male, in no acute distress. The vital signs are documented above. CARDIAC: There is a regular rate and rhythm.  VASCULAR: he has a palpable right femoral pulse and a diminished left femoral pulse. He has a palpable right popliteal pulse. I cannot palpate pedal pulses. PULMONARY: There is good air exchange bilaterally without wheezing or rales. ABDOMEN: Soft and non-tender with normal pitched bowel sounds.  MUSCULOSKELETAL: There are no major deformities or cyanosis. NEUROLOGIC: No focal weakness or paresthesias are detected. SKIN: he has several venous ulcers of the left leg. He has hyperpigmentation bilaterally consistent with chronic venous insufficiency. 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  PSYCHIATRIC: The patient has a normal affect.  DATA:  I reviewed his arteriogram that was performed by Dr. Fabienne Bruns. This shows a superficial femoral artery occlusion on the left with reconstitution of his popliteal artery and runoff via the peroneal artery.  I have independently interpreted his vein map today which shows that the great saphenous vein on the left has diameters ranging from 0.25-0.73 cm. The vein is marginal in size in some areas.  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 He will need a left femoral to below-knee popliteal artery bypass. The vein is marginal in size. If the vein is not adequate he would require a prosthetic bypass. Likewise if the patient had significant disease of the saphenous vein given his chronic venous insufficiency he would require a prosthetic graft. His daughter is getting married and  he cannot schedule his surgery until November 19. I have reviewed the indications for lower extremity bypass. I have also reviewed the potential complications of surgery including but not limited to: wound healing problems, infection, graft thrombosis, limb loss, or other unpredictable medical problems. All the patient's questions were answered and they are agreeable to proceed.  Waverly Ferrari [facility] [place] Beeper: [phone]

## 2014-10-09 ENCOUNTER — Encounter (HOSPITAL_COMMUNITY): Payer: BLUE CROSS/BLUE SHIELD

## 2014-10-14 ENCOUNTER — Ambulatory Visit: Payer: BLUE CROSS/BLUE SHIELD | Admitting: Vascular Surgery

## 2014-11-05 NOTE — Pre-Procedure Instructions (Addendum)
Jamelle Goldston Picardi  11/05/2014      [facility] - [place], [place] - [address] [place] [address] Phone: [phone] Fax: [phone]    Your procedure is scheduled on Tues, Nov 15   Report to [facility] Admitting at 5:30 AM  Call this number if you have problems the morning of surgery:  [phone]   Remember:  Do not eat food or drink liquids after midnight.  Take these medicines the morning of surgery with A SIP OF WATER Carvedilol(Coreg)                No Goody's,BC's,Aleve,Ibuprofen,Fish Oil,or any Herbal Medications.on 11/12/14              How to Manage Your Diabetes Before Surgery   Why is it important to control my blood sugar before and after surgery?   Improving blood sugar levels before and after surgery helps healing and can limit problems.  A way of improving blood sugar control is eating a healthy diet by:  - Eating less sugar and carbohydrates  - Increasing activity/exercise  - Talk with your doctor about reaching your blood sugar goals  High blood sugars (greater than 180 mg/dL) can raise your risk of infections and slow down your recovery so you will need to focus on controlling your diabetes during the weeks before surgery.  Make sure that the doctor who takes care of your diabetes knows about your planned surgery including the date and location.  How do I manage my blood sugars before surgery?   Check your blood sugar at least 4 times a day, 2 days before surgery to make sure that they are not too high or low.   Check your blood sugar the morning of your surgery when you wake up and every 2               hours until you get to the Short-Stay unit.  If your blood sugar is less than 70 mg/dL, you will need to treat for low blood sugar by:  Treat a low blood sugar (less than 70 mg/dL) with 1/2 cup of clear juice (cranberry or apple), 4 glucose tablets, OR glucose gel.  Recheck blood sugar in 15  minutes after treatment (to make sure it is greater than 70 mg/dL).  If blood sugar is not greater than 70 mg/dL on re-check, call [phone] for further instructions.   Report your blood sugar to the Short-Stay nurse when you get to Short-Stay.  References:  [facility], 2007 "How to Manage your Diabetes Before and After Surgery".  What do I do about my diabetes medications?   Do not take oral diabetes medicines (pills) the morning of surgery.(metformin,jardiance)  THE NIGHT BEFORE SURGERY, take 10 units of Novolin 70/30(.70%)   THE MORNING OF SURGERY, take Humulin N 5 units(50%)         Do not wear jewelry, make-up or nail polish.  Do not wear lotions, powders, or perfumes.  You may wear deodorant.  Do not shave 48 hours prior to surgery.  Men may shave face and neck.  Do not bring valuables to the hospital.  [facility] is not responsible for any belongings or valuables.  Contacts, dentures or bridgework may not be worn into surgery.  Leave your suitcase in the car.  After surgery it may be brought to your room.  For patients admitted to the hospital, discharge  time will be determined by your treatment team.  Patients discharged the day of surgery will not be allowed to drive home.    Special instructions:  [facility] - Preparing for Surgery  Before surgery, you can play an important role.  Because skin is not sterile, your skin needs to be as free of germs as possible.  You can reduce the number of germs on you skin by washing with CHG (chlorahexidine gluconate) soap before surgery.  CHG is an antiseptic cleaner which kills germs and bonds with the skin to continue killing germs even after washing.  Please DO NOT use if you have an allergy to CHG or antibacterial soaps.  If your skin becomes reddened/irritated stop using the CHG and inform your nurse when you arrive at Short Stay.  Do not shave (including legs and underarms) for at least 48  hours prior to the first CHG shower.  You may shave your face.  Please follow these instructions carefully:   1.  Shower with CHG Soap the night before surgery and the                                morning of Surgery.  2.  If you choose to wash your hair, wash your hair first as usual with your       normal shampoo.  3.  After you shampoo, rinse your hair and body thoroughly to remove the                      Shampoo.  4.  Use CHG as you would any other liquid soap.  You can apply chg directly       to the skin and wash gently with scrungie or a clean washcloth.  5.  Apply the CHG Soap to your body ONLY FROM THE NECK DOWN.        Do not use on open wounds or open sores.  Avoid contact with your eyes,       ears, mouth and genitals (private parts).  Wash genitals (private parts)       with your normal soap.  6.  Wash thoroughly, paying special attention to the area where your surgery        will be performed.  7.  Thoroughly rinse your body with warm water from the neck down.  8.  DO NOT shower/wash with your normal soap after using and rinsing off       the CHG Soap.  9.  Pat yourself dry with a clean towel.            10.  Wear clean pajamas.            11.  Place clean sheets on your bed the night of your first shower and do not        sleep with pets.  Day of Surgery  Do not apply any lotions/deoderants the morning of surgery.  Please wear clean clothes to the hospital/surgery center.    Please read over the following fact sheets that you were given. Pain Booklet, Coughing and Deep Breathing, Blood Transfusion Information, MRSA Information and Surgical Site Infection Prevention

## 2014-11-06 ENCOUNTER — Encounter (HOSPITAL_COMMUNITY): Payer: Self-pay

## 2014-11-06 ENCOUNTER — Encounter (HOSPITAL_COMMUNITY)
Admission: RE | Admit: 2014-11-06 | Discharge: 2014-11-06 | Disposition: A | Discharge status: Home / Self Care | Payer: BLUE CROSS/BLUE SHIELD | Source: Ambulatory Visit | Attending: Vascular Surgery | Admitting: Vascular Surgery

## 2014-11-06 DIAGNOSIS — Z01812 Encounter for preprocedural laboratory examination: Secondary | ICD-10-CM | POA: Diagnosis not present

## 2014-11-06 DIAGNOSIS — I2510 Atherosclerotic heart disease of native coronary artery without angina pectoris: Secondary | ICD-10-CM | POA: Insufficient documentation

## 2014-11-06 DIAGNOSIS — Z7982 Long term (current) use of aspirin: Secondary | ICD-10-CM | POA: Insufficient documentation

## 2014-11-06 DIAGNOSIS — I739 Peripheral vascular disease, unspecified: Secondary | ICD-10-CM | POA: Insufficient documentation

## 2014-11-06 DIAGNOSIS — I5022 Chronic systolic (congestive) heart failure: Secondary | ICD-10-CM | POA: Insufficient documentation

## 2014-11-06 DIAGNOSIS — I110 Hypertensive heart disease with heart failure: Secondary | ICD-10-CM | POA: Insufficient documentation

## 2014-11-06 DIAGNOSIS — F17200 Nicotine dependence, unspecified, uncomplicated: Secondary | ICD-10-CM | POA: Insufficient documentation

## 2014-11-06 DIAGNOSIS — E119 Type 2 diabetes mellitus without complications: Secondary | ICD-10-CM | POA: Diagnosis not present

## 2014-11-06 DIAGNOSIS — Z955 Presence of coronary angioplasty implant and graft: Secondary | ICD-10-CM | POA: Insufficient documentation

## 2014-11-06 DIAGNOSIS — Z794 Long term (current) use of insulin: Secondary | ICD-10-CM | POA: Insufficient documentation

## 2014-11-06 DIAGNOSIS — Z01818 Encounter for other preprocedural examination: Secondary | ICD-10-CM | POA: Insufficient documentation

## 2014-11-06 DIAGNOSIS — Z7984 Long term (current) use of oral hypoglycemic drugs: Secondary | ICD-10-CM | POA: Insufficient documentation

## 2014-11-06 DIAGNOSIS — E785 Hyperlipidemia, unspecified: Secondary | ICD-10-CM | POA: Insufficient documentation

## 2014-11-06 DIAGNOSIS — Z95810 Presence of automatic (implantable) cardiac defibrillator: Secondary | ICD-10-CM | POA: Insufficient documentation

## 2014-11-06 DIAGNOSIS — Z0183 Encounter for blood typing: Secondary | ICD-10-CM | POA: Diagnosis not present

## 2014-11-06 DIAGNOSIS — Z79899 Other long term (current) drug therapy: Secondary | ICD-10-CM | POA: Insufficient documentation

## 2014-11-06 HISTORY — DX: Presence of cardiac pacemaker: Z95.0

## 2014-11-06 HISTORY — DX: Cardiac murmur, unspecified: R01.1

## 2014-11-06 HISTORY — DX: Personal history of urinary calculi: Z87.442

## 2014-11-06 HISTORY — DX: Presence of automatic (implantable) cardiac defibrillator: Z95.810

## 2014-11-06 LAB — URINALYSIS, ROUTINE W REFLEX MICROSCOPIC
Bilirubin Urine: NEGATIVE
Glucose, UA: >1000 mg/dL — AB
Ketones, ur: NEGATIVE mg/dL
LEUKOCYTES UA: NEGATIVE
NITRITE: NEGATIVE
Protein, ur: 30 mg/dL — AB
SPECIFIC GRAVITY, URINE: 1.031 — AB (ref 1.005–1.030)
UROBILINOGEN UA: 1 mg/dL (ref 0.0–1.0)
pH: 6.5 (ref 5.0–8.0)

## 2014-11-06 LAB — URINE MICROSCOPIC-ADD ON

## 2014-11-06 LAB — SURGICAL PCR SCREEN
MRSA, PCR: NEGATIVE
Staphylococcus aureus: POSITIVE — AB

## 2014-11-06 LAB — COMPREHENSIVE METABOLIC PANEL
ALT: 27 U/L (ref 17–63)
AST: 21 U/L (ref 15–41)
Albumin: 3.4 g/dL — ABNORMAL LOW (ref 3.5–5.0)
Alkaline Phosphatase: 152 U/L — ABNORMAL HIGH (ref 38–126)
Anion gap: 9 (ref 5–15)
BILIRUBIN TOTAL: 1 mg/dL (ref 0.3–1.2)
BUN: 19 mg/dL (ref 6–20)
CALCIUM: 9.2 mg/dL (ref 8.9–10.3)
CO2: 30 mmol/L (ref 22–32)
Chloride: 96 mmol/L — ABNORMAL LOW (ref 101–111)
Creatinine, Ser: 1.15 mg/dL (ref 0.61–1.24)
GFR calc Af Amer: >60 mL/min (ref >60)
Glucose, Bld: 229 mg/dL — ABNORMAL HIGH (ref 65–99)
POTASSIUM: 3.9 mmol/L (ref 3.5–5.1)
Sodium: 135 mmol/L (ref 135–145)
TOTAL PROTEIN: 8.2 g/dL — AB (ref 6.5–8.1)

## 2014-11-06 LAB — CBC
HEMATOCRIT: 41.5 % (ref 39.0–52.0)
Hemoglobin: 13.3 g/dL (ref 13.0–17.0)
MCH: 28.7 pg (ref 26.0–34.0)
MCHC: 32 g/dL (ref 30.0–36.0)
MCV: 89.6 fL (ref 78.0–100.0)
Platelets: 163 10*3/uL (ref 150–400)
RBC: 4.63 MIL/uL (ref 4.22–5.81)
RDW: 16 % — AB (ref 11.5–15.5)
WBC: 7.7 10*3/uL (ref 4.0–10.5)

## 2014-11-06 LAB — GLUCOSE, CAPILLARY: GLUCOSE-CAPILLARY: 246 mg/dL — AB (ref 65–99)

## 2014-11-06 LAB — TYPE AND SCREEN
ABO/RH(D): A POS
Antibody Screen: NEGATIVE

## 2014-11-06 LAB — ABO/RH: ABO/RH(D): A POS

## 2014-11-06 LAB — APTT: aPTT: 32 seconds (ref 24–37)

## 2014-11-06 LAB — PROTIME-INR
INR: 1.22 (ref 0.00–1.49)
PROTHROMBIN TIME: 15.6 s — AB (ref 11.6–15.2)

## 2014-11-09 NOTE — Progress Notes (Signed)
Anesthesia Chart Review:  Pt is 59 year old male scheduled for L fem-pop bypass graft on 11/17/2014 with Dr. Edilia Boickson.   Cardiologist is Dr. Jens Somrenshaw, last office visit 08/18/14.   PMH includes: CAD (a. AWMI requiring IABP 2005 s/p Horizon study stent-LAD, staged BMS-Cx, DESx2-RCA. b. Last Last LHC (6/06): EF 25%, pLAD stent 40-50, after stent 40, dLAD 20, pCFX 20, pOM1 70, pRCA 20, mRCA stents ok), chronic systolic CHF, ischemic cardiomyopathy, AICD (St. Jude), HTN, DM, hyperlipidemia, carotid artery disease, PVD, heart murmur. Current smoker. BMI 30. S/p R FPBG 11/07/13.   Medications include: ASA, lipitor, benazepril, carvedilol, zetia, novolin N, novolin 70/30, jardiance, metformin, spironolactone, torsemide.   Preoperative labs reviewed.  Glucose 229, hgbA1c 7.2. PT 15.6  EKG 08/20/14: sinus rhythm with PVCs. Possible LAE. LAD. Nonspecific intraventricular block. Inferior infarct, age undetermined. Anterolateral infarct, age undetermined.   Carotid duplex US 08/01/13: Heterogeneous plaque, bilaterally. 60-79% bilateral ICA stenosis, stable over serial exams.  Echo 05/28/13:  - Left ventricle: The cavity size was mildly dilated. Wall thickness was normal. Systolic function was severely reduced. The estimated ejection fraction was in the range of 20% to 25%. Akinesis and scarring of the basal-midinferior myocardium; consistent with infarction. Akinesis and scarring of the mid-apicalanteroseptal, anterior, anterolateral, and apical myocardium; consistent with infarction. Features are consistent with a pseudonormal left ventricular filling pattern, with concomitant abnormal relaxation and increased filling pressure (grade 2 diastolic dysfunction). No evidence of thrombus. - Mitral valve: There was moderate regurgitation directed centrally. - Left atrium: The atrium was moderately dilated. - Right atrium: The atrium was mildly dilated. - Pericardium, extracardiac: There was a right pleural  effusion.  Nuclear stress test 07/09/12: High risk stress nuclear study with large, severe, fixed anterior, septal, inferior and apical infarct; no ischemia. LV Ejection Fraction: 26%. LV Wall Motion: Global hypokinesis.  Cardiac cath 06/17/04:  1. Severely decreased left ventricular systolic function secondary to prior anterior wall myocardial infarction. 2. Status post three-vessel stenting. There is moderate restenosis in the left anterior descending artery stent with diffuse disease in the mid- LAD distal to the stent. There is also moderate disease in the large obtuse marginal which is unchanged from previous catheterization. The stents in the left circumflex and right coronary artery are widely patent.  Reviewed case with Dr. Desmond Lopeurk.   If no changes, I anticipate pt can proceed with surgery as scheduled.   Rica Mastngela Dior Stepter, FNP-BC [facility] Phone: [phone] 11/09/2014 4:09 PM

## 2014-11-16 MED ORDER — DEXTROSE 5 % IV SOLN
1.5000 g | INTRAVENOUS | Status: AC
  Administered 2014-11-17 (×2): 1.5 g via INTRAVENOUS
  Filled 2014-11-16: qty 1.5

## 2014-11-17 ENCOUNTER — Inpatient Hospital Stay (HOSPITAL_COMMUNITY)
Admission: RE | Admit: 2014-11-17 | Discharge: 2014-11-19 | DRG: 253 | Disposition: A | Discharge status: Home / Self Care | Payer: BLUE CROSS/BLUE SHIELD | Source: Ambulatory Visit | Attending: Vascular Surgery | Admitting: Vascular Surgery

## 2014-11-17 ENCOUNTER — Inpatient Hospital Stay (HOSPITAL_COMMUNITY): Payer: BLUE CROSS/BLUE SHIELD | Admitting: Emergency Medicine

## 2014-11-17 ENCOUNTER — Encounter (HOSPITAL_COMMUNITY)
Admission: RE | Disposition: A | Discharge status: Home / Self Care | Payer: Self-pay | Source: Ambulatory Visit | Attending: Vascular Surgery

## 2014-11-17 ENCOUNTER — Inpatient Hospital Stay (HOSPITAL_COMMUNITY): Payer: BLUE CROSS/BLUE SHIELD | Admitting: Anesthesiology

## 2014-11-17 ENCOUNTER — Encounter (HOSPITAL_COMMUNITY): Payer: Self-pay | Admitting: *Deleted

## 2014-11-17 DIAGNOSIS — I872 Venous insufficiency (chronic) (peripheral): Principal | ICD-10-CM | POA: Diagnosis present

## 2014-11-17 DIAGNOSIS — L97929 Non-pressure chronic ulcer of unspecified part of left lower leg with unspecified severity: Secondary | ICD-10-CM | POA: Diagnosis present

## 2014-11-17 DIAGNOSIS — F17210 Nicotine dependence, cigarettes, uncomplicated: Secondary | ICD-10-CM | POA: Diagnosis present

## 2014-11-17 DIAGNOSIS — Z95810 Presence of automatic (implantable) cardiac defibrillator: Secondary | ICD-10-CM

## 2014-11-17 DIAGNOSIS — I10 Essential (primary) hypertension: Secondary | ICD-10-CM | POA: Diagnosis present

## 2014-11-17 DIAGNOSIS — I748 Embolism and thrombosis of other arteries: Secondary | ICD-10-CM | POA: Diagnosis present

## 2014-11-17 DIAGNOSIS — I255 Ischemic cardiomyopathy: Secondary | ICD-10-CM | POA: Diagnosis present

## 2014-11-17 DIAGNOSIS — Z8249 Family history of ischemic heart disease and other diseases of the circulatory system: Secondary | ICD-10-CM | POA: Diagnosis not present

## 2014-11-17 DIAGNOSIS — I5022 Chronic systolic (congestive) heart failure: Secondary | ICD-10-CM | POA: Diagnosis present

## 2014-11-17 DIAGNOSIS — Z794 Long term (current) use of insulin: Secondary | ICD-10-CM | POA: Diagnosis not present

## 2014-11-17 DIAGNOSIS — Z79899 Other long term (current) drug therapy: Secondary | ICD-10-CM

## 2014-11-17 DIAGNOSIS — R05 Cough: Secondary | ICD-10-CM

## 2014-11-17 DIAGNOSIS — E11622 Type 2 diabetes mellitus with other skin ulcer: Secondary | ICD-10-CM | POA: Diagnosis present

## 2014-11-17 DIAGNOSIS — E782 Mixed hyperlipidemia: Secondary | ICD-10-CM | POA: Diagnosis present

## 2014-11-17 DIAGNOSIS — Z955 Presence of coronary angioplasty implant and graft: Secondary | ICD-10-CM | POA: Diagnosis not present

## 2014-11-17 DIAGNOSIS — I739 Peripheral vascular disease, unspecified: Secondary | ICD-10-CM | POA: Diagnosis present

## 2014-11-17 DIAGNOSIS — E1165 Type 2 diabetes mellitus with hyperglycemia: Secondary | ICD-10-CM | POA: Diagnosis present

## 2014-11-17 DIAGNOSIS — Z7982 Long term (current) use of aspirin: Secondary | ICD-10-CM

## 2014-11-17 DIAGNOSIS — Z95828 Presence of other vascular implants and grafts: Secondary | ICD-10-CM

## 2014-11-17 DIAGNOSIS — Z7984 Long term (current) use of oral hypoglycemic drugs: Secondary | ICD-10-CM

## 2014-11-17 DIAGNOSIS — I2510 Atherosclerotic heart disease of native coronary artery without angina pectoris: Secondary | ICD-10-CM | POA: Diagnosis present

## 2014-11-17 DIAGNOSIS — R058 Other specified cough: Secondary | ICD-10-CM

## 2014-11-17 HISTORY — PX: FEMORAL-POPLITEAL BYPASS GRAFT: SHX937

## 2014-11-17 LAB — CBC
HEMATOCRIT: 39.8 % (ref 39.0–52.0)
Hemoglobin: 12.7 g/dL — ABNORMAL LOW (ref 13.0–17.0)
MCH: 28.5 pg (ref 26.0–34.0)
MCHC: 31.9 g/dL (ref 30.0–36.0)
MCV: 89.4 fL (ref 78.0–100.0)
Platelets: 165 10*3/uL (ref 150–400)
RBC: 4.45 MIL/uL (ref 4.22–5.81)
RDW: 15.9 % — ABNORMAL HIGH (ref 11.5–15.5)
WBC: 9.5 10*3/uL (ref 4.0–10.5)

## 2014-11-17 LAB — CREATININE, SERUM
Creatinine, Ser: 0.97 mg/dL (ref 0.61–1.24)
GFR calc non Af Amer: >60 mL/min (ref >60)

## 2014-11-17 LAB — GLUCOSE, CAPILLARY
GLUCOSE-CAPILLARY: 87 mg/dL (ref 65–99)
Glucose-Capillary: 168 mg/dL — ABNORMAL HIGH (ref 65–99)
Glucose-Capillary: 126 mg/dL — ABNORMAL HIGH (ref 65–99)
Glucose-Capillary: 113 mg/dL — ABNORMAL HIGH (ref 65–99)

## 2014-11-17 SURGERY — BYPASS GRAFT FEMORAL-POPLITEAL ARTERY
Anesthesia: General | Site: Leg Upper | Laterality: Left

## 2014-11-17 MED ORDER — 0.9 % SODIUM CHLORIDE (POUR BTL) OPTIME
TOPICAL | Status: DC | PRN
  Administered 2014-11-17: 3000 mL

## 2014-11-17 MED ORDER — METOPROLOL TARTRATE 1 MG/ML IV SOLN: 2.0000 mg | INTRAVENOUS | Status: DC | PRN

## 2014-11-17 MED ORDER — HEPARIN SODIUM (PORCINE) 1000 UNIT/ML IJ SOLN
INTRAMUSCULAR | Status: AC
  Filled 2014-11-17: qty 1

## 2014-11-17 MED ORDER — HEPARIN SODIUM (PORCINE) 1000 UNIT/ML IJ SOLN
INTRAMUSCULAR | Status: DC | PRN
  Administered 2014-11-17: 9000 [IU] via INTRAVENOUS

## 2014-11-17 MED ORDER — DOCUSATE SODIUM 100 MG PO CAPS
100.0000 mg | ORAL_CAPSULE | Freq: Every day | ORAL | Status: DC
  Filled 2014-11-17 (×2): qty 1

## 2014-11-17 MED ORDER — ROCURONIUM BROMIDE 100 MG/10ML IV SOLN
INTRAVENOUS | Status: DC | PRN
  Administered 2014-11-17: 20 mg via INTRAVENOUS
  Administered 2014-11-17: 10 mg via INTRAVENOUS
  Administered 2014-11-17: 20 mg via INTRAVENOUS

## 2014-11-17 MED ORDER — TORSEMIDE 20 MG PO TABS
60.0000 mg | ORAL_TABLET | Freq: Every day | ORAL | Status: DC
  Administered 2014-11-18 – 2014-11-19 (×2): 60 mg via ORAL
  Filled 2014-11-17 (×2): qty 3

## 2014-11-17 MED ORDER — ACETAMINOPHEN 650 MG RE SUPP: 325.0000 mg | RECTAL | Status: DC | PRN

## 2014-11-17 MED ORDER — DEXTROSE 5 % IV SOLN
1.5000 g | Freq: Two times a day (BID) | INTRAVENOUS | Status: AC
  Administered 2014-11-17 – 2014-11-18 (×2): 1.5 g via INTRAVENOUS
  Filled 2014-11-17 (×3): qty 1.5

## 2014-11-17 MED ORDER — FENTANYL CITRATE (PF) 250 MCG/5ML IJ SOLN
INTRAMUSCULAR | Status: AC
  Filled 2014-11-17: qty 5

## 2014-11-17 MED ORDER — ENOXAPARIN SODIUM 30 MG/0.3ML ~~LOC~~ SOLN
30.0000 mg | SUBCUTANEOUS | Status: DC
  Filled 2014-11-17: qty 0.3

## 2014-11-17 MED ORDER — HEPARIN SODIUM (PORCINE) 5000 UNIT/ML IJ SOLN
INTRAMUSCULAR | Status: DC | PRN
  Administered 2014-11-17: 08:00:00

## 2014-11-17 MED ORDER — CHLORHEXIDINE GLUCONATE CLOTH 2 % EX PADS: 6.0000 | MEDICATED_PAD | Freq: Once | CUTANEOUS | Status: DC

## 2014-11-17 MED ORDER — NEOSTIGMINE METHYLSULFATE 10 MG/10ML IV SOLN
INTRAVENOUS | Status: DC | PRN
  Administered 2014-11-17: 4 mg via INTRAVENOUS

## 2014-11-17 MED ORDER — SODIUM CHLORIDE 0.9 % IV SOLN: 500.0000 mL | Freq: Once | INTRAVENOUS | Status: DC | PRN

## 2014-11-17 MED ORDER — OXYCODONE HCL 5 MG PO TABS
5.0000 mg | ORAL_TABLET | ORAL | Status: DC | PRN
  Administered 2014-11-17: 10 mg via ORAL
  Administered 2014-11-18: 5 mg via ORAL
  Filled 2014-11-17: qty 2
  Filled 2014-11-17: qty 1

## 2014-11-17 MED ORDER — LACTATED RINGERS IV SOLN
INTRAVENOUS | Status: DC | PRN
  Administered 2014-11-17 (×2): via INTRAVENOUS

## 2014-11-17 MED ORDER — SUCCINYLCHOLINE CHLORIDE 20 MG/ML IJ SOLN
INTRAMUSCULAR | Status: AC
  Filled 2014-11-17: qty 1

## 2014-11-17 MED ORDER — BENAZEPRIL HCL 5 MG PO TABS
5.0000 mg | ORAL_TABLET | Freq: Every day | ORAL | Status: DC
  Administered 2014-11-17 – 2014-11-19 (×2): 5 mg via ORAL
  Filled 2014-11-17 (×3): qty 1

## 2014-11-17 MED ORDER — EZETIMIBE 10 MG PO TABS
10.0000 mg | ORAL_TABLET | Freq: Every day | ORAL | Status: DC
Start: 2014-11-17 — End: 2014-11-19
  Administered 2014-11-17 – 2014-11-18 (×2): 10 mg via ORAL
  Filled 2014-11-17 (×3): qty 1

## 2014-11-17 MED ORDER — SODIUM CHLORIDE 0.9 % IV SOLN: INTRAVENOUS | Status: DC

## 2014-11-17 MED ORDER — PROTAMINE SULFATE 10 MG/ML IV SOLN
INTRAVENOUS | Status: DC | PRN
  Administered 2014-11-17: 40 mg via INTRAVENOUS

## 2014-11-17 MED ORDER — SPIRONOLACTONE 25 MG PO TABS
12.5000 mg | ORAL_TABLET | Freq: Every day | ORAL | Status: DC
  Administered 2014-11-17 – 2014-11-19 (×3): 12.5 mg via ORAL
  Filled 2014-11-17 (×3): qty 1

## 2014-11-17 MED ORDER — OXYCODONE HCL 5 MG PO TABS
ORAL_TABLET | ORAL | Status: AC
  Filled 2014-11-17: qty 2

## 2014-11-17 MED ORDER — POTASSIUM CHLORIDE CRYS ER 20 MEQ PO TBCR: 20.0000 meq | EXTENDED_RELEASE_TABLET | Freq: Every day | ORAL | Status: DC | PRN

## 2014-11-17 MED ORDER — INSULIN ASPART 100 UNIT/ML ~~LOC~~ SOLN: 0.0000 [IU] | Freq: Every day | SUBCUTANEOUS | Status: DC

## 2014-11-17 MED ORDER — ATORVASTATIN CALCIUM 80 MG PO TABS
80.0000 mg | ORAL_TABLET | Freq: Every day | ORAL | Status: DC
  Administered 2014-11-17 – 2014-11-18 (×2): 80 mg via ORAL
  Filled 2014-11-17 (×3): qty 1

## 2014-11-17 MED ORDER — ETOMIDATE 2 MG/ML IV SOLN
INTRAVENOUS | Status: AC
  Filled 2014-11-17: qty 10

## 2014-11-17 MED ORDER — THROMBIN 20000 UNITS EX SOLR
CUTANEOUS | Status: AC
  Filled 2014-11-17: qty 20000

## 2014-11-17 MED ORDER — INSULIN ASPART 100 UNIT/ML ~~LOC~~ SOLN
0.0000 [IU] | Freq: Three times a day (TID) | SUBCUTANEOUS | Status: DC
Start: 2014-11-18 — End: 2014-11-19
  Administered 2014-11-18 (×3): 5 [IU] via SUBCUTANEOUS
  Administered 2014-11-19 (×2): 3 [IU] via SUBCUTANEOUS

## 2014-11-17 MED ORDER — ASPIRIN EC 81 MG PO TBEC
81.0000 mg | DELAYED_RELEASE_TABLET | Freq: Every day | ORAL | Status: DC
  Administered 2014-11-18 – 2014-11-19 (×2): 81 mg via ORAL
  Filled 2014-11-17 (×2): qty 1

## 2014-11-17 MED ORDER — EPHEDRINE SULFATE 50 MG/ML IJ SOLN
INTRAMUSCULAR | Status: AC
  Filled 2014-11-17: qty 1

## 2014-11-17 MED ORDER — MIDAZOLAM HCL 2 MG/2ML IJ SOLN
INTRAMUSCULAR | Status: AC
  Filled 2014-11-17: qty 4

## 2014-11-17 MED ORDER — GLYCOPYRROLATE 0.2 MG/ML IJ SOLN
INTRAMUSCULAR | Status: AC
  Filled 2014-11-17: qty 3

## 2014-11-17 MED ORDER — GUAIFENESIN-DM 100-10 MG/5ML PO SYRP: 15.0000 mL | ORAL_SOLUTION | ORAL | Status: DC | PRN

## 2014-11-17 MED ORDER — ONDANSETRON HCL 4 MG/2ML IJ SOLN: 4.0000 mg | Freq: Four times a day (QID) | INTRAMUSCULAR | Status: DC | PRN

## 2014-11-17 MED ORDER — PAPAVERINE HCL 30 MG/ML IJ SOLN
INTRAMUSCULAR | Status: DC | PRN
  Administered 2014-11-17: 60 mg via INTRAVENOUS

## 2014-11-17 MED ORDER — MORPHINE SULFATE (PF) 2 MG/ML IV SOLN: 2.0000 mg | INTRAVENOUS | Status: DC | PRN

## 2014-11-17 MED ORDER — ROCURONIUM BROMIDE 50 MG/5ML IV SOLN
INTRAVENOUS | Status: AC
  Filled 2014-11-17: qty 1

## 2014-11-17 MED ORDER — ALUM & MAG HYDROXIDE-SIMETH 200-200-20 MG/5ML PO SUSP: 15.0000 mL | ORAL | Status: DC | PRN

## 2014-11-17 MED ORDER — INSULIN ASPART 100 UNIT/ML ~~LOC~~ SOLN: 0.0000 [IU] | Freq: Three times a day (TID) | SUBCUTANEOUS | Status: DC

## 2014-11-17 MED ORDER — LABETALOL HCL 5 MG/ML IV SOLN: 10.0000 mg | INTRAVENOUS | Status: DC | PRN

## 2014-11-17 MED ORDER — ONDANSETRON HCL 4 MG/2ML IJ SOLN
INTRAMUSCULAR | Status: AC
  Filled 2014-11-17: qty 2

## 2014-11-17 MED ORDER — FENTANYL CITRATE (PF) 100 MCG/2ML IJ SOLN
INTRAMUSCULAR | Status: DC | PRN
  Administered 2014-11-17: 50 ug via INTRAVENOUS
  Administered 2014-11-17: 100 ug via INTRAVENOUS
  Administered 2014-11-17 (×3): 50 ug via INTRAVENOUS

## 2014-11-17 MED ORDER — SODIUM CHLORIDE 0.9 % IV SOLN
INTRAVENOUS | Status: DC
  Administered 2014-11-17: 17:00:00 via INTRAVENOUS

## 2014-11-17 MED ORDER — SUCCINYLCHOLINE CHLORIDE 20 MG/ML IJ SOLN
INTRAMUSCULAR | Status: DC | PRN
  Administered 2014-11-17: 100 mg via INTRAVENOUS

## 2014-11-17 MED ORDER — PROTAMINE SULFATE 10 MG/ML IV SOLN
INTRAVENOUS | Status: AC
  Filled 2014-11-17: qty 5

## 2014-11-17 MED ORDER — GLYCOPYRROLATE 0.2 MG/ML IJ SOLN
INTRAMUSCULAR | Status: DC | PRN
  Administered 2014-11-17: 0.6 mg via INTRAVENOUS

## 2014-11-17 MED ORDER — NEOSTIGMINE METHYLSULFATE 10 MG/10ML IV SOLN
INTRAVENOUS | Status: AC
  Filled 2014-11-17: qty 1

## 2014-11-17 MED ORDER — BISACODYL 10 MG RE SUPP: 10.0000 mg | Freq: Every day | RECTAL | Status: DC | PRN

## 2014-11-17 MED ORDER — HYDROMORPHONE HCL 1 MG/ML IJ SOLN
INTRAMUSCULAR | Status: AC
  Filled 2014-11-17: qty 1

## 2014-11-17 MED ORDER — ACETAMINOPHEN 325 MG PO TABS: 325.0000 mg | ORAL_TABLET | ORAL | Status: DC | PRN

## 2014-11-17 MED ORDER — PHENOL 1.4 % MT LIQD: 1.0000 | OROMUCOSAL | Status: DC | PRN

## 2014-11-17 MED ORDER — PHENYLEPHRINE HCL 10 MG/ML IJ SOLN
INTRAMUSCULAR | Status: DC | PRN
  Administered 2014-11-17 (×2): 80 mg via INTRAVENOUS

## 2014-11-17 MED ORDER — SENNOSIDES-DOCUSATE SODIUM 8.6-50 MG PO TABS: 1.0000 | ORAL_TABLET | Freq: Every evening | ORAL | Status: DC | PRN

## 2014-11-17 MED ORDER — ONDANSETRON HCL 4 MG/2ML IJ SOLN
INTRAMUSCULAR | Status: DC | PRN
  Administered 2014-11-17: 4 mg via INTRAVENOUS

## 2014-11-17 MED ORDER — ETOMIDATE 2 MG/ML IV SOLN
INTRAVENOUS | Status: DC | PRN
  Administered 2014-11-17: 12 mg via INTRAVENOUS

## 2014-11-17 MED ORDER — CARVEDILOL 3.125 MG PO TABS
3.1250 mg | ORAL_TABLET | Freq: Two times a day (BID) | ORAL | Status: DC
  Administered 2014-11-17 – 2014-11-19 (×4): 3.125 mg via ORAL
  Filled 2014-11-17 (×4): qty 1

## 2014-11-17 MED ORDER — PAPAVERINE HCL 30 MG/ML IJ SOLN
INTRAMUSCULAR | Status: AC
  Filled 2014-11-17: qty 2

## 2014-11-17 MED ORDER — HYDROMORPHONE HCL 1 MG/ML IJ SOLN
0.2500 mg | INTRAMUSCULAR | Status: DC | PRN
  Administered 2014-11-17: 1 mg via INTRAVENOUS

## 2014-11-17 MED ORDER — MIDAZOLAM HCL 5 MG/5ML IJ SOLN
INTRAMUSCULAR | Status: DC | PRN
  Administered 2014-11-17 (×2): 1 mg via INTRAVENOUS

## 2014-11-17 MED ORDER — DEXTROSE 5 % IV SOLN
1.5000 g | Freq: Once | INTRAVENOUS | Status: DC
  Filled 2014-11-17: qty 1.5

## 2014-11-17 MED ORDER — PANTOPRAZOLE SODIUM 40 MG PO TBEC
40.0000 mg | DELAYED_RELEASE_TABLET | Freq: Every day | ORAL | Status: DC
  Administered 2014-11-17 – 2014-11-19 (×3): 40 mg via ORAL
  Filled 2014-11-17 (×3): qty 1

## 2014-11-17 MED ORDER — HYDRALAZINE HCL 20 MG/ML IJ SOLN: 5.0000 mg | INTRAMUSCULAR | Status: DC | PRN

## 2014-11-17 MED ORDER — VECURONIUM BROMIDE 10 MG IV SOLR
INTRAVENOUS | Status: DC | PRN
  Administered 2014-11-17 (×4): 2 mg via INTRAVENOUS

## 2014-11-17 SURGICAL SUPPLY — 61 items
BANDAGE ELASTIC 4 VELCRO ST LF (GAUZE/BANDAGES/DRESSINGS) ×2 IMPLANT
BANDAGE ELASTIC 6 VELCRO ST LF (GAUZE/BANDAGES/DRESSINGS) ×2 IMPLANT
BANDAGE ESMARK 6X9 LF (GAUZE/BANDAGES/DRESSINGS) IMPLANT
BNDG CMPR 9X6 STRL LF SNTH (GAUZE/BANDAGES/DRESSINGS) ×1
BNDG COHESIVE 6X5 TAN STRL LF (GAUZE/BANDAGES/DRESSINGS) ×2 IMPLANT
BNDG ESMARK 6X9 LF (GAUZE/BANDAGES/DRESSINGS) ×3
BNDG GAUZE ELAST 4 BULKY (GAUZE/BANDAGES/DRESSINGS) ×2 IMPLANT
CANISTER SUCTION 2500CC (MISCELLANEOUS) ×3 IMPLANT
CANNULA VESSEL 3MM 2 BLNT TIP (CANNULA) ×6 IMPLANT
CATH EMB 4FR 80CM (CATHETERS) ×2 IMPLANT
CLIP TI MEDIUM 24 (CLIP) ×3 IMPLANT
CLIP TI WIDE RED SMALL 24 (CLIP) ×7 IMPLANT
CUFF TOURNIQUET SINGLE 24IN (TOURNIQUET CUFF) ×2 IMPLANT
DRAIN CHANNEL 15F RND FF W/TCR (WOUND CARE) IMPLANT
DRAPE SURG 17X23 STRL (DRAPES) ×2 IMPLANT
ELECT REM PT RETURN 9FT ADLT (ELECTROSURGICAL) ×3
ELECTRODE REM PT RTRN 9FT ADLT (ELECTROSURGICAL) ×1 IMPLANT
EVACUATOR SILICONE 100CC (DRAIN) IMPLANT
GAUZE SPONGE 4X4 16PLY XRAY LF (GAUZE/BANDAGES/DRESSINGS) ×2 IMPLANT
GLOVE BIO SURGEON STRL SZ 6.5 (GLOVE) ×2 IMPLANT
GLOVE BIO SURGEON STRL SZ7 (GLOVE) ×2 IMPLANT
GLOVE BIO SURGEON STRL SZ7.5 (GLOVE) ×3 IMPLANT
GLOVE BIO SURGEONS STRL SZ 6.5 (GLOVE) ×2
GLOVE BIOGEL PI IND STRL 6.5 (GLOVE) IMPLANT
GLOVE BIOGEL PI IND STRL 7.0 (GLOVE) IMPLANT
GLOVE BIOGEL PI IND STRL 7.5 (GLOVE) IMPLANT
GLOVE BIOGEL PI IND STRL 8 (GLOVE) ×1 IMPLANT
GLOVE BIOGEL PI INDICATOR 6.5 (GLOVE) ×6
GLOVE BIOGEL PI INDICATOR 7.0 (GLOVE) ×2
GLOVE BIOGEL PI INDICATOR 7.5 (GLOVE) ×2
GLOVE BIOGEL PI INDICATOR 8 (GLOVE) ×2
GLOVE ECLIPSE 6.5 STRL STRAW (GLOVE) ×4 IMPLANT
GLOVE SURG SS PI 6.5 STRL IVOR (GLOVE) ×2 IMPLANT
GOWN STRL REUS W/ TWL LRG LVL3 (GOWN DISPOSABLE) ×3 IMPLANT
GOWN STRL REUS W/TWL LRG LVL3 (GOWN DISPOSABLE) ×21
GRAFT PROPATEN THIN WALL 6X80 (Vascular Products) ×2 IMPLANT
KIT BASIN OR (CUSTOM PROCEDURE TRAY) ×3 IMPLANT
KIT ROOM TURNOVER OR (KITS) ×3 IMPLANT
LIQUID BAND (GAUZE/BANDAGES/DRESSINGS) ×6 IMPLANT
LOOP VESSEL MAXI BLUE (MISCELLANEOUS) ×2 IMPLANT
LOOP VESSEL MINI RED (MISCELLANEOUS) ×2 IMPLANT
NS IRRIG 1000ML POUR BTL (IV SOLUTION) ×6 IMPLANT
PACK PERIPHERAL VASCULAR (CUSTOM PROCEDURE TRAY) ×3 IMPLANT
PAD ARMBOARD 7.5X6 YLW CONV (MISCELLANEOUS) ×6 IMPLANT
SPONGE GAUZE 4X4 12PLY STER LF (GAUZE/BANDAGES/DRESSINGS) ×2 IMPLANT
SPONGE SURGIFOAM ABS GEL 100 (HEMOSTASIS) IMPLANT
STAPLER VISISTAT (STAPLE) IMPLANT
STOPCOCK 4 WAY LG BORE MALE ST (IV SETS) ×2 IMPLANT
SUT PROLENE 5 0 C 1 24 (SUTURE) ×3 IMPLANT
SUT PROLENE 6 0 BV (SUTURE) ×13 IMPLANT
SUT SILK 2 0 SH (SUTURE) ×3 IMPLANT
SUT SILK 3 0 (SUTURE) ×6
SUT SILK 3-0 18XBRD TIE 12 (SUTURE) IMPLANT
SUT VIC AB 2-0 CTB1 (SUTURE) ×6 IMPLANT
SUT VIC AB 3-0 SH 27 (SUTURE) ×9
SUT VIC AB 3-0 SH 27X BRD (SUTURE) ×2 IMPLANT
SUT VICRYL 4-0 PS2 18IN ABS (SUTURE) ×8 IMPLANT
SYRINGE 3CC LL L/F (MISCELLANEOUS) ×2 IMPLANT
TRAY FOLEY W/METER SILVER 16FR (SET/KITS/TRAYS/PACK) ×3 IMPLANT
UNDERPAD 30X30 INCONTINENT (UNDERPADS AND DIAPERS) ×3 IMPLANT
WATER STERILE IRR 1000ML POUR (IV SOLUTION) ×3 IMPLANT

## 2014-11-17 NOTE — Transfer of Care (Signed)
Immediate Anesthesia Transfer of Care Note  Patient: [name]  Procedure(s) Performed: Procedure(s): BYPASS GRAFT FEMORAL-POPLITEAL ARTERY USING GORE PROPATEN 6MM X 80CM VASCULAR GRAFT (Left)  Patient Location: PACU  Anesthesia Type:General  Level of Consciousness: awake, alert , oriented and sedated  Airway & Oxygen Therapy: Patient Spontanous Breathing and Patient connected to nasal cannula oxygen  Post-op Assessment: Report given to RN, Post -op Vital signs reviewed and stable and Patient moving all extremities  Post vital signs: Reviewed and stable  Last Vitals:  Filed Vitals:   11/17/14 0601  BP: 104/58  Pulse: 94  Temp: 36.3 C  Resp: 16    Complications: No apparent anesthesia complications

## 2014-11-17 NOTE — Anesthesia Preprocedure Evaluation (Addendum)
Anesthesia Evaluation  Patient identified by MRN, date of birth, ID band Patient awake    Reviewed: Allergy & Precautions, H&P , NPO status , Patient's Chart, lab work & pertinent test results, reviewed documented beta blocker date and time   Airway Mallampati: II  TM Distance: >3 FB Neck ROM: Full    Dental no notable dental hx. (+) Partial Lower, Partial Upper, Dental Advisory Given   Pulmonary Current Smoker,    Pulmonary exam normal breath sounds clear to auscultation       Cardiovascular hypertension, Pt. on medications and Pt. on home beta blockers + CAD, + Past MI, + Cardiac Stents and + Peripheral Vascular Disease  + pacemaker + Cardiac Defibrillator + Valvular Problems/Murmurs MR  Rhythm:Regular Rate:Normal     Neuro/Psych negative neurological ROS  negative psych ROS   GI/Hepatic negative GI ROS, Neg liver ROS,   Endo/Other  diabetes, Insulin Dependent  Renal/GU negative Renal ROS  negative genitourinary   Musculoskeletal   Abdominal   Peds  Hematology negative hematology ROS (+)   Anesthesia Other Findings   Reproductive/Obstetrics negative OB ROS                            Anesthesia Physical Anesthesia Plan  ASA: IV  Anesthesia Plan: General   Post-op Pain Management:    Induction: Intravenous  Airway Management Planned: Oral ETT  Additional Equipment: Arterial line  Intra-op Plan:   Post-operative Plan: Extubation in OR and Possible Post-op intubation/ventilation  Informed Consent: I have reviewed the patients History and Physical, chart, labs and discussed the procedure including the risks, benefits and alternatives for the proposed anesthesia with the patient or authorized representative who has indicated his/her understanding and acceptance.   Dental advisory given  Plan Discussed with: CRNA  Anesthesia Plan Comments:         Anesthesia Quick  Evaluation

## 2014-11-17 NOTE — Anesthesia Procedure Notes (Signed)
Procedure Name: Intubation Date/Time: 11/17/2014 7:43 AM Performed by: Fransisca KaufmannMEYER, Malak Orantes E Pre-anesthesia Checklist: Patient identified, Emergency Drugs available, Suction available, Patient being monitored and Timeout performed Patient Re-evaluated:Patient Re-evaluated prior to inductionOxygen Delivery Method: Circle system utilized Preoxygenation: Pre-oxygenation with 100% oxygen Intubation Type: IV induction Ventilation: Mask ventilation without difficulty Laryngoscope Size: Mac and 4 Grade View: Grade I Tube type: Oral Tube size: 7.5 mm Number of attempts: 1 Airway Equipment and Method: Stylet Placement Confirmation: ETT inserted through vocal cords under direct vision,  positive ETCO2 and breath sounds checked- equal and bilateral Secured at: 23 cm Tube secured with: Tape Dental Injury: Teeth and Oropharynx as per pre-operative assessment

## 2014-11-17 NOTE — Op Note (Signed)
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 Harvesting of left great saphenous vein 2. Left femoral to below knee popliteal artery bypass with 6 mm PTFE graft  SURGEON: Di Kindlehristopher S. Edilia Boickson, MD, FACS  ASSIST: Leonides SakeBrian Chen, MD Doreatha MassedSamantha Rhyne, PA  ANESTHESIA: Gen.   EBL: minimal  INDICATIONS: [name] is a 59 y.o. male he presented with multiple venous stasis ulcers of his left leg and was found to have severe infrainguinal arterial occlusive disease. His only runoff was the peroneal artery. By vein map his great saphenous vein appeared fairly small.  FINDINGS: there was not adequate great saphenous vein to do autogenous bypass. The vein narrowed to 2 mm over a long length in the mid thigh.  TECHNIQUE: The patient was taken to the operating room and received a general anesthetic. The left lower extremity and groin were prepped and draped in usual sterile fashion. Longitudinal incision was made in the left groin. Through this incision the common femoral, superficial femoral, and deep femoral artery were dissected free and controlled. Multiple branches were controlled with vessel loops. In order to clamp proximally I had to get well above  the inguinal ligament. The saphenofemoral junction was dissected free. Using 2 additional incisions along the medial aspect of the left thigh great saphenous vein was harvested to the mid thigh where it became very small after it branched multiple times and became unusable. Therefore elected to use a 6 mm propatent graft.  A longitudinal incision was made along the medial aspect of the left leg below the knee. Through this incision the dissection was carried down to the operative. The popliteal vein was retracted inferiorly exposing the popliteal artery. A tunnel was  created between this incision and the groin incision. The patient was heparinized. The 6 mm PTFE graft was tunneled between the 2 incisions. The external iliac artery was clamped. All the side branches were controlled as was the deep femoral artery and superficial femoral artery. A longitudinal arteriotomy was made in the soft spot on the common femoral artery. The graft was spatulated and sewn end to side to the common femoral artery using continuous 6-0 Prolene suture. Before completing anastomosis the inflow was tested and there was good inflow. The graft was fully proper length for anastomosis to the below-knee popliteal artery. Tourniquet was placed on the thigh. The leg was exsanguinated with Esmarch bandage and tourniquet inflated to 300 mmHg. Under tourniquet control, a longitudinal arteriotomy was made in the below-knee popliteal artery. This was patent at this level. The graft was cut to the appropriate length, spatulated, and sewn into side to the below-knee popliteal artery using continuous 6-0 Prolene suture. Prior to completing the anastomosis the arteries were backbled and flushed properly and the anastomosis completed. Flow was reestablished to the left foot. It was a good peroneal signal at the completion which was graft dependent. The heparin was partially reversed with protamine. The 2 wounds in the thigh for vein harvest were closed with a deep layer of 3-0 Vicryl and the skin closed with 4-0 Vicryl. Hemostasis was obtained in the groin incision. I did place a radiopaque marker around the proximal anastomosis. This wound was closed with a deep layer of 2-0 Vicryl, a subcutaneous layer of 3-0 Vicryl, and the skin closed with 4-0 Vicryl. The below-knee incision  was closed with a deep layer of 2-0 Vicryl, a subcutaneous layer of 3-0 Vicryl and the skin closed with 4-0 Vicryl. A sterile dressing was applied. The patient tolerated the procedure well and was transferred to the recovery room in stable  condition. All needle and sponge counts were correct.  Waverly Ferrari, MD, FACS [facility]  DATE OF DICTATION:   11/17/2014

## 2014-11-17 NOTE — Progress Notes (Signed)
   VASCULAR SURGERY POST OP CHECK:  * Doing well  * tobacco cessation   SUBJECTIVE: Pain well controlled  PHYSICAL EXAM: Filed Vitals:   11/17/14 0601 11/17/14 1157 11/17/14 1229  BP: 104/58    Pulse: 94  87  Temp: 97.4 F (36.3 C) 97.8 F (36.6 C)   TempSrc: Oral    Resp: 16    SpO2: 100%  98%   Per and DP signal with doppler Groin incision looks fine.    Recent Labs  11/17/14 0610 11/17/14 1201  GLUCAP 168* 126*    Active Problems:   PAD (peripheral artery disease) (HCC)   [name] Beeper: [phone]: [phone] 11/17/2014

## 2014-11-17 NOTE — H&P (Signed)
Vascular and Vein Specialist of [facility]  Patient name: [name] [facility]: [id number] DOB: 10-02-1957Sex: male  REASON FOR VISIT: To discuss left femoropopliteal bypass grafting.  HPI: [name] is a 59 y.o. male, who is status post previous right femoral to below-knee pop to artery bypass with a vein graft in November 2015. The patient subsequently developed wounds on the right foot with a scab over his medial malleolus on the left and a small wound over his lateral malleolus. He underwent an arteriogram which shows a superficial femoral artery occlusion on the left. He also had angioplasty of an iliac stenosis on the right above his femoropopliteal bypass graft.  He has wounds and venous stasis disease of his left leg which have not changed significantly. I think the poor healing is related to his known arterial disease. He has no symptoms on the right side.  Past Medical History  Diagnosis Date  . Hyperlipidemia     mixed  . Chronic systolic heart failure (HCC)   . ICD (implantable cardiac defibrillator), dual, in situ     St. Jude for severe LVD EF 25% 2/07 explanted 2010. Medtronic Virtuoso II DR Dual-chamber cardiverter-defibrillator with pocket revision, Dr. Graciela Husbands  . Tobacco abuse   . Ischemic cardiomyopathy     a. Echo (09/27/12): EF 20%, diffuse HK with periapical AK, no LV thrombus noted, restrictive physiology, trivial MR, mild LAE, RVSF mildly reduced, PASP 39  . Cerebrovascular disease   . Diabetes mellitus   . HTN (hypertension)   . PVD (peripheral vascular disease) (HCC)     a. ABI 09/2011 - R normal, L moderate - saw Dr. Kirke Corin - med rx.   . Carotid artery disease (HCC)     a. Dopp 09/2011: 60-79% RICA, 40-59% LICA.; b. Carotid US (7/14): Bilateral 60-79% => f/u 6 mos  . Coronary artery disease     a. AWMI requiring IABP 2005 s/p Horizon study stent-LAD, staged BMS-Cx, DESx2-RCA. b. Last Last  LHC (6/06): EF 25%, pLAD stent 40-50, after stent 40, dLAD 20, pCFX 20, pOM1 70, pRCA 20, mRCA stents ok.=> med Rx; c. Myoview (7/14): EF 26%, large ant, septal, inf and apical infarct, no ischemia. Med Rx continued  . RIATA ICD Lead--on advisory recall    Family History  Problem Relation Age of Onset  . Diabetes Mother   . Coronary artery disease Father    SOCIAL HISTORY: Social History   Social History  . Marital Status: Single    Spouse Name: N/A  . Number of Children: N/A  . Years of Education: N/A   Occupational History  . [employer]    Full time   Social History Main Topics  . Smoking status: Current Some Day Smoker -- 1.00 packs/day for 33 years    Types: Cigarettes  . Smokeless tobacco: Never Used     Comment: smoker for 33 years  . Alcohol Use: No  . Drug Use: No     Comment: former Cocain, Acid, Marijuana - 25 years ago  . Sexual Activity: No   Other Topics Concern  . Not on file   Social History Narrative   Divorced.   Allergies  Allergen Reactions  . Niacin Itching and Other (See Comments)    Flushing   . Metformin And Related     Vomiting    Current Outpatient Prescriptions  Medication Sig Dispense Refill  . aspirin 81 MG tablet Take 1 tablet (81 mg total) by mouth daily. 30 tablet  3  . atorvastatin (LIPITOR) 80 MG tablet Take 1 tablet (80 mg total) by mouth every other day. 30 tablet 11  . benazepril (LOTENSIN) 5 MG tablet Take 1 tablet (5 mg total) by mouth daily. 30 tablet 6  . carvedilol (COREG) 6.25 MG tablet Take 1 tablet (6.25 mg total) by mouth 2 (two) times daily with a meal. (Patient taking differently: Take 3.125 mg by mouth 2 (two) times daily with a meal. ) 60 tablet 2  . cefUROXime (CEFTIN) 500 MG tablet Take 1 tablet (500 mg total) by mouth 2 (two) times daily with a meal. 14 tablet 0  . doxycycline  (VIBRA-TABS) 100 MG tablet Take 1 tablet (100 mg total) by mouth 2 (two) times daily. 14 tablet 0  . ezetimibe (ZETIA) 10 MG tablet Take 1 tablet (10 mg total) by mouth daily. 30 tablet 6  . insulin NPH Human (HUMULIN N,NOVOLIN N) 100 UNIT/ML injection Inject 10 Units into the skin daily.     . insulin NPH-regular Human (NOVOLIN 70/30) (70-30) 100 UNIT/ML injection Inject 16-20 Units into the skin 2 (two) times daily with a meal. 20 units in the morning, 16 units in the evening    . JARDIANCE 25 MG TABS tablet Take 25 mg by mouth daily.    . metFORMIN (GLUCOPHAGE) 500 MG tablet Take 500 mg by mouth daily with breakfast.    . spironolactone (ALDACTONE) 25 MG tablet TAKE ONE-HALF TABLET BY MOUTH ONCE DAILY 30 tablet 0  . torsemide (DEMADEX) 20 MG tablet Take 3 tablets (60 mg total) by mouth once. (Patient taking differently: Take 60 mg by mouth daily. ) 90 tablet 11   No current facility-administered medications for this visit.   REVIEW OF SYSTEMS:   denotes positive finding,  denotes negative finding Cardiac  Comments:  Chest pain or chest pressure:    Shortness of breath upon exertion:    Short of breath when lying flat:    Irregular heart rhythm:        Vascular    Pain in calf, thigh, or hip brought on by ambulation:    Pain in feet at night that wakes you up from your sleep:     Blood clot in your veins:    Leg swelling:         Pulmonary    Oxygen at home:    Productive cough:     Wheezing:         Neurologic    Sudden weakness in arms or legs:     Sudden numbness in arms or legs:     Sudden onset of difficulty speaking or slurred speech:    Temporary loss of vision in one eye:     Problems with dizziness:         Gastrointestinal    Blood in stool:     Vomited blood:         Genitourinary    Burning when urinating:     Blood in  urine:        Psychiatric    Major depression:         Hematologic    Bleeding problems:    Problems with blood clotting too easily:        Skin    Rashes or ulcers:        Constitutional    Fever or chills:      PHYSICAL EXAM: Filed Vitals:   10/07/14 0907  BP: 124/77  Pulse: 95  Height: 6' (1.829 m)  Weight: 205 lb 12.8 oz (93.35 kg)  SpO2: 100%   GENERAL: The patient is a well-nourished male, in no acute distress. The vital signs are documented above. CARDIAC: There is a regular rate and rhythm.  VASCULAR: he has a palpable right femoral pulse and a diminished left femoral pulse. He has a palpable right popliteal pulse. I cannot palpate pedal pulses. PULMONARY: There is good air exchange bilaterally without wheezing or rales. ABDOMEN: Soft and non-tender with normal pitched bowel sounds.  MUSCULOSKELETAL: There are no major deformities or cyanosis. NEUROLOGIC: No focal weakness or paresthesias are detected. SKIN: he has several venous ulcers of the left leg. He has hyperpigmentation bilaterally consistent with chronic venous insufficiency. 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  PSYCHIATRIC: The patient has a normal affect.  DATA:  I reviewed his arteriogram that was performed by Dr. Fabienne Brunsharles Fields. This shows a superficial femoral artery occlusion on the left with reconstitution of his popliteal artery and runoff via the peroneal artery.  I have independently interpreted his vein map today which shows that the great saphenous vein on the left has diameters ranging from 0.25-0.73 cm. The vein is marginal in size in some areas.  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 He will need a left femoral to below-knee popliteal artery bypass. The vein is marginal in size. If the vein is not adequate he would require a prosthetic bypass. Likewise if the patient had significant disease of the saphenous vein given his chronic venous insufficiency he would require a prosthetic graft. His daughter is getting married and he cannot schedule his surgery until November 19. I have reviewed the indications for lower extremity bypass. I have also reviewed the potential complications of surgery including but not limited to: wound healing problems, infection, graft thrombosis, limb loss, or other unpredictable medical problems. All the patient's questions were answered and they are agreeable to proceed.  Waverly Ferrariickson, Loana Salvaggio [facility] [place] [facility]: [phone]

## 2014-11-17 NOTE — Anesthesia Postprocedure Evaluation (Signed)
  Anesthesia Post-op Note  Patient: [name]  Procedure(s) Performed: Procedure(s): BYPASS GRAFT FEMORAL-POPLITEAL ARTERY USING GORE PROPATEN 6MM X 80CM VASCULAR GRAFT (Left)  Patient Location: PACU  Anesthesia Type:General  Level of Consciousness: awake and alert   Airway and Oxygen Therapy: Patient Spontanous Breathing  Post-op Pain: Controlled  Post-op Assessment: Post-op Vital signs reviewed, Patient's Cardiovascular Status Stable and Respiratory Function Stable  Post-op Vital Signs: Reviewed  Filed Vitals:   11/17/14 1229  BP:   Pulse: 87  Temp:   Resp:     Complications: No apparent anesthesia complications

## 2014-11-18 ENCOUNTER — Encounter (HOSPITAL_COMMUNITY): Payer: Self-pay | Admitting: Vascular Surgery

## 2014-11-18 ENCOUNTER — Encounter (HOSPITAL_COMMUNITY): Payer: BLUE CROSS/BLUE SHIELD

## 2014-11-18 LAB — BASIC METABOLIC PANEL
Anion gap: 9 (ref 5–15)
BUN: 19 mg/dL (ref 6–20)
CHLORIDE: 102 mmol/L (ref 101–111)
CO2: 26 mmol/L (ref 22–32)
Calcium: 8.6 mg/dL — ABNORMAL LOW (ref 8.9–10.3)
Creatinine, Ser: 1.08 mg/dL (ref 0.61–1.24)
GFR calc Af Amer: >60 mL/min (ref >60)
GFR calc non Af Amer: >60 mL/min (ref >60)
GLUCOSE: 127 mg/dL — AB (ref 65–99)
POTASSIUM: 4.3 mmol/L (ref 3.5–5.1)
Sodium: 137 mmol/L (ref 135–145)

## 2014-11-18 LAB — CBC
HCT: 40.5 % (ref 39.0–52.0)
HEMOGLOBIN: 12.9 g/dL — AB (ref 13.0–17.0)
MCH: 28.5 pg (ref 26.0–34.0)
MCHC: 31.9 g/dL (ref 30.0–36.0)
MCV: 89.6 fL (ref 78.0–100.0)
Platelets: 148 10*3/uL — ABNORMAL LOW (ref 150–400)
RBC: 4.52 MIL/uL (ref 4.22–5.81)
RDW: 16 % — ABNORMAL HIGH (ref 11.5–15.5)
WBC: 9 10*3/uL (ref 4.0–10.5)

## 2014-11-18 LAB — GLUCOSE, CAPILLARY
GLUCOSE-CAPILLARY: 214 mg/dL — AB (ref 65–99)
GLUCOSE-CAPILLARY: 222 mg/dL — AB (ref 65–99)
GLUCOSE-CAPILLARY: 246 mg/dL — AB (ref 65–99)
Glucose-Capillary: 195 mg/dL — ABNORMAL HIGH (ref 65–99)

## 2014-11-18 LAB — HEMOGLOBIN A1C
Hgb A1c MFr Bld: 9.6 % — ABNORMAL HIGH (ref 4.8–5.6)
Mean Plasma Glucose: 229 mg/dL

## 2014-11-18 MED ORDER — INSULIN ASPART PROT & ASPART (70-30 MIX) 100 UNIT/ML ~~LOC~~ SUSP
10.0000 [IU] | Freq: Two times a day (BID) | SUBCUTANEOUS | Status: DC
  Administered 2014-11-19: 10 [IU] via SUBCUTANEOUS
  Filled 2014-11-18: qty 10

## 2014-11-18 MED ORDER — ENOXAPARIN SODIUM 40 MG/0.4ML ~~LOC~~ SOLN
40.0000 mg | SUBCUTANEOUS | Status: DC
  Administered 2014-11-18 – 2014-11-19 (×2): 40 mg via SUBCUTANEOUS
  Filled 2014-11-18 (×2): qty 0.4

## 2014-11-18 NOTE — Progress Notes (Signed)
Inpatient Diabetes Program Recommendations  AACE/ADA: New Consensus Statement on Inpatient Glycemic Control (2015)  Target Ranges:  Prepandial:   less than 140 mg/dL      Peak postprandial:   less than 180 mg/dL (1-2 hours)      Critically ill patients:  140 - 180 mg/dL   Review of Glycemic Control  Diabetes history: DM 2 Outpatient Diabetes medications: NPH 10 units daily + 70/30 26 units QAM 10 units QPM, Jardiance 25 mg, Metformin 500 mg BID Current orders for Inpatient glycemic control: Novolog Moderate + HS  Inpatient Diabetes Program Recommendations: Insulin - Basal: Please consider starting a portion of patient's home dose of 70/30, 10 units BID.  Thanks,  Christena DeemShannon Ketara Cavness RN, MSN, [facility] Inpatient Diabetes Coordinator Team Pager [phone] (8a-5p)

## 2014-11-18 NOTE — Evaluation (Signed)
Physical Therapy Evaluation Patient Details Name: [name] MRN: [id number] DOB: 1955-04-10 Today's Date: 11/18/2014   History of Present Illness  Pt is a 59 y/o F w/ previous Rt fem-pop bypass now s/p Lt fem-pop bypass.  Pt has wounds on Bil LEs.  Pt's PMH inlcudes ICD, chronic systolic heart failure, DM, CVD.  Clinical Impression  Pt admitted with above diagnosis. Pt currently with functional limitations due to the deficits listed below (see PT Problem List). [name] will have intermittent assist available at home from his sister.  He ambulated 250 ft in hallway w/ RW and supervision for safety. Pt will benefit from skilled PT to increase their independence and safety with mobility to allow discharge to the venue listed below.       Follow Up Recommendations No PT follow up;Supervision - Intermittent    Equipment Recommendations  None recommended by PT    Recommendations for Other Services OT consult     Precautions / Restrictions Precautions Precautions: Fall Restrictions Weight Bearing Restrictions: No      Mobility  Bed Mobility               General bed mobility comments: Pt sitting in recliner chair upon PT arrival  Transfers Overall transfer level: Needs assistance Equipment used: Rolling walker (2 wheeled) Transfers: Sit to/from Stand Sit to Stand: Supervision         General transfer comment: Pt slow to stand 2/2 soreness/tightness in Lt LE. Safe technique and proper hand placement for sit>stand.  Cues to bring RW with him while performing stand>sit.  Ambulation/Gait Ambulation/Gait assistance: Supervision Ambulation Distance (Feet): 250 Feet Assistive device: Rolling walker (2 wheeled) Gait Pattern/deviations: Antalgic;Trunk flexed;Decreased stance time - left;Step-through pattern   Gait velocity interpretation: Below normal speed for age/gender General Gait Details: Cues to walk within the RW as pt demonstrates trunk flexion, pushing the RW  too far ahead.    Stairs Stairs: Yes Stairs assistance: Supervision Stair Management: Two rails;Step to pattern;Forwards Number of Stairs: 3 General stair comments: Cues for proper technique and sequencing.  Supervision for pt's safety  Wheelchair Mobility    Modified Rankin (Stroke Patients Only)       Balance Overall balance assessment: Needs assistance Sitting-balance support: Feet supported Sitting balance-Leahy Scale: Good     Standing balance support: Bilateral upper extremity supported;During functional activity Standing balance-Leahy Scale: Poor Standing balance comment: Relies on RW for support                             Pertinent Vitals/Pain Pain Assessment: 0-10 Pain Score: 5  Pain Location: Lt groin Pain Descriptors / Indicators: Sore;Tightness Pain Intervention(s): Limited activity within patient's tolerance;Monitored during session    Home Living Family/patient expects to be discharged to:: Private residence Living Arrangements: Parent Available Help at Discharge: Family;Available PRN/intermittently (sister and father live nearby) Type of Home: House Home Access: Stairs to enter Entrance Stairs-Rails: Lawyer of Steps: 2 Home Layout: One level Home Equipment: Environmental consultant - 2 wheels;Crutches Additional Comments: Pt lives at home w/ his mother.  PTA pt was doing all of the housework, cooking, cleaning.  Pt reports his sister lives nearby and can come over prn to assist w/ these tasks.    Prior Function Level of Independence: Independent               Hand Dominance        Extremity/Trunk Assessment   Upper Extremity  Assessment: Overall WFL for tasks assessed           Lower Extremity Assessment: LLE deficits/detail (Bil LE ulcers)   LLE Deficits / Details: weakness and limited ROM as expected s/p surgery     Communication   Communication: No difficulties  Cognition Arousal/Alertness:  Awake/alert Behavior During Therapy: WFL for tasks assessed/performed Overall Cognitive Status: Within Functional Limits for tasks assessed                      General Comments      Exercises General Exercises - Lower Extremity Ankle Circles/Pumps: AROM;Both;10 reps;Seated Long Arc Quad: AROM;Both;10 reps;Seated Other Exercises Other Exercises: Encouraged pt to ambulate in hallway w/ nursing staff at least 2 more times today      Assessment/Plan    PT Assessment Patient needs continued PT services  PT Diagnosis Difficulty walking;Acute pain   PT Problem List Decreased strength;Decreased range of motion;Decreased activity tolerance;Decreased balance;Decreased mobility;Decreased knowledge of use of DME;Decreased safety awareness;Decreased knowledge of precautions;Pain  PT Treatment Interventions DME instruction;Gait training;Stair training;Functional mobility training;Therapeutic activities;Therapeutic exercise;Balance training;Neuromuscular re-education;Patient/family education   PT Goals (Current goals can be found in the Care Plan section) Acute Rehab PT Goals Patient Stated Goal: to go home once ready PT Goal Formulation: With patient Time For Goal Achievement: 11/25/14 Potential to Achieve Goals: Good    Frequency Min 3X/week   Barriers to discharge Inaccessible home environment;Decreased caregiver support Intermittent assist and steps to enter home    Co-evaluation               End of Session Equipment Utilized During Treatment: Gait belt Activity Tolerance: Patient limited by pain;Patient tolerated treatment well Patient left: in chair;with call bell/phone within reach Nurse Communication: Mobility status;Precautions         Time: [id number] PT Time Calculation (min) (ACUTE ONLY): 15 min   Charges:   PT Evaluation $Initial PT Evaluation Tier I: 1 Procedure     PT G Codes:       Michail JewelsAshley [name] PT, DPT [phone] Pager: [phone] 11/18/2014,  12:17 PM

## 2014-11-18 NOTE — Progress Notes (Addendum)
  Progress Note  11/18/2014 7:23 AM 1 Day Post-Op  Subjective:  C/o soreness, especially in left groin  Afebrile HR 80's NSR 100's systolic 97% RA  Filed Vitals:   11/18/14 0505  BP: 102/73  Pulse: 87  Temp:   Resp: 11   Physical Exam: Cardiac:  regular Lungs:  Non labored Incisions:  All are c/d/i Extremities:  Left foot is warm with brisk DP doppler signal; wound on shin and ankle are clean  CBC    Component Value Date/Time   WBC 9.0 11/18/2014 0009   RBC 4.52 11/18/2014 0009   HGB 12.9* 11/18/2014 0009   HCT 40.5 11/18/2014 0009   PLT 148* 11/18/2014 0009   MCV 89.6 11/18/2014 0009   MCH 28.5 11/18/2014 0009   MCHC 31.9 11/18/2014 0009   RDW 16.0* 11/18/2014 0009   LYMPHSABS 1.2 09/22/2014 0539   MONOABS 0.5 09/22/2014 0539   EOSABS 0.1 09/22/2014 0539   BASOSABS 0.0 09/22/2014 0539    BMET    Component Value Date/Time   NA 137 11/18/2014 0009   K 4.3 11/18/2014 0009   CL 102 11/18/2014 0009   CO2 26 11/18/2014 0009   GLUCOSE 127* 11/18/2014 0009   BUN 19 11/18/2014 0009   CREATININE 1.08 11/18/2014 0009   CREATININE 0.80 01/12/2014 0848   CALCIUM 8.6* 11/18/2014 0009   GFRNONAA >60 11/18/2014 0009   GFRNONAA >89 01/12/2014 0848   GFRAA >60 11/18/2014 0009   GFRAA >89 01/12/2014 0848    INR    Component Value Date/Time   INR 1.22 11/06/2014 0914     Intake/Output Summary (Last 24 hours) at 11/18/14 0723 Last data filed at 11/18/14 0000  Gross per 24 hour  Intake   2595 ml  Output   1300 ml  Net   1295 ml   Assessment:  59 y.o. male is s/p:  1. Harvesting of left great saphenous vein 2. Left femoral to below knee popliteal artery bypass with 6 mm PTFE graft  1 Day Post-Op  Plan: -pt with patent bypass graft with brisk DP doppler flow-right -continue leg elevation when pt is not mobilizing -PT eval and treat -incisions look fine-left groin without hematoma -DVT prophylaxis:  Lovenox to start later today -decrease in platelets  from 165k to 148k-continue to monitor -transfer to 2 west -I have left dressings off for Dr. Edilia Boickson to inspect wounds then hydrogel on shin and ankle wound.  Doreatha MassedSamantha Rhyne, PA-C [facility] [phone] 11/18/2014 7:23 AM  Agree with above.  Transfer to 2W Possibly home tomorrow and po Keflex and local wound care.  Waverly Ferrarihristopher Naomia Lenderman, MD, FACS Beeper [phone] Office: [phone]

## 2014-11-18 NOTE — Progress Notes (Signed)
OT Cancellation Note  Patient Details Name: [name] MRN: [id number] DOB: [birth year]   Cancelled Treatment:    Reason Eval/Treat Not Completed: Fatigue/lethargy limiting ability to participate. Pt had just returne to bed upon OTs arrival, requesting to take a nap.  Will follow.  Evern BioMayberry, Danie Hannig Lynn 11/18/2014, 2:50 PM  [phone]

## 2014-11-18 NOTE — Progress Notes (Signed)
Utilization review completed.  

## 2014-11-19 ENCOUNTER — Inpatient Hospital Stay (HOSPITAL_COMMUNITY): Payer: BLUE CROSS/BLUE SHIELD

## 2014-11-19 DIAGNOSIS — Z95828 Presence of other vascular implants and grafts: Secondary | ICD-10-CM

## 2014-11-19 LAB — GLUCOSE, CAPILLARY
GLUCOSE-CAPILLARY: 151 mg/dL — AB (ref 65–99)
Glucose-Capillary: 179 mg/dL — ABNORMAL HIGH (ref 65–99)

## 2014-11-19 MED ORDER — COLLAGENASE 250 UNIT/GM EX OINT
TOPICAL_OINTMENT | Freq: Every day | CUTANEOUS | Status: DC
  Administered 2014-11-19: 10:00:00 via TOPICAL
  Filled 2014-11-19: qty 30

## 2014-11-19 MED ORDER — METFORMIN HCL 500 MG PO TABS
500.0000 mg | ORAL_TABLET | Freq: Every day | ORAL | Status: DC
  Administered 2014-11-19: 500 mg via ORAL
  Filled 2014-11-19: qty 1

## 2014-11-19 MED ORDER — MIDAZOLAM HCL 2 MG/2ML IJ SOLN
INTRAMUSCULAR | Status: AC
  Filled 2014-11-19: qty 2

## 2014-11-19 MED ORDER — CEPHALEXIN 500 MG PO CAPS
500.0000 mg | ORAL_CAPSULE | Freq: Three times a day (TID) | ORAL | Status: DC
  Administered 2014-11-19 (×2): 500 mg via ORAL
  Filled 2014-11-19 (×2): qty 1

## 2014-11-19 MED ORDER — OXYCODONE HCL 5 MG PO TABS: 5.0000 mg | ORAL_TABLET | Freq: Four times a day (QID) | ORAL | Status: DC | PRN

## 2014-11-19 MED ORDER — EMPAGLIFLOZIN 25 MG PO TABS
25.0000 mg | ORAL_TABLET | Freq: Every day | ORAL | Status: DC
Start: 2014-11-19 — End: 2014-11-19

## 2014-11-19 MED ORDER — CEPHALEXIN 500 MG PO CAPS: 500.0000 mg | ORAL_CAPSULE | Freq: Three times a day (TID) | ORAL | Status: DC

## 2014-11-19 MED ORDER — FENTANYL CITRATE (PF) 100 MCG/2ML IJ SOLN
INTRAMUSCULAR | Status: DC
Start: 2014-11-19 — End: 2014-11-19
  Filled 2014-11-19: qty 2

## 2014-11-19 NOTE — Evaluation (Signed)
Occupational Therapy Evaluation Patient Details Name: [name] MRN: [id number] DOB: 1955-07-01 Today's Date: 11/19/2014    History of Present Illness Pt is a 59 y/o F w/ previous Rt fem-pop bypass now s/p Lt fem-pop bypass.  Pt has wounds on Bil LEs.  Pt's PMH inlcudes ICD, chronic systolic heart failure, DM, CVD.   Clinical Impression   Patient evaluated by Occupational Therapy with no further acute OT needs identified. All education has been completed and the patient has no further questions. OT eval completed.  Pt eager to discharge home.  Completed education with pt, although am not sure he will comply with recommendations.  He is refusing DME.   See below for any follow-up Occupational Therapy or equipment needs. OT is signing off. Thank you for this referral.      Follow Up Recommendations  No OT follow up;Supervision - Intermittent    Equipment Recommendations  None recommended by OT (Pt refusing DME)    Recommendations for Other Services       Precautions / Restrictions Precautions Precautions: Fall      Mobility Bed Mobility                  Transfers Overall transfer level: Needs assistance Equipment used: Rolling walker (2 wheeled) Transfers: Sit to/from UGI CorporationStand;Stand Pivot Transfers Sit to Stand: Supervision Stand pivot transfers: Min guard            Balance Overall balance assessment: Needs assistance Sitting-balance support: Feet supported Sitting balance-Leahy Scale: Good     Standing balance support: During functional activity Standing balance-Leahy Scale: Fair                              ADL Overall ADL's : Needs assistance/impaired Eating/Feeding: Independent   Grooming: Wash/dry hands;Wash/dry face;Oral care;Brushing hair;Min guard;Standing   Upper Body Bathing: Set up;Sitting   Lower Body Bathing: Minimal assistance;Sit to/from stand   Upper Body Dressing : Set up;Sitting   Lower Body Dressing: Moderate  assistance;Sit to/from stand   Toilet Transfer: Min guard;Comfort height toilet;Ambulation;Grab bars;RW   Toileting- ArchitectClothing Manipulation and Hygiene: Min guard;Sit to/from stand       Functional mobility during ADLs: Min guard;Rolling walker General ADL Comments: Pt unable to access Lt foot for LB ADLs due to pain.  Pt very reluctant to work with OT initially.  He does report he has a Sports administratorreacher at home.  Instructed him how to use reacher to assist with LB ADLs.  Pt standing in room without RW, RN present (pt refusing to use RW while standing).  Pt with LOB x2 requiring min A to prevent fall. Reinforced to pt the need to use AD at home, at all times, to prevent falls.  He voices understanding.   Cautioned pt to sit to shower, but he is not receptive to shower seat      Vision     Perception     Praxis      Pertinent Vitals/Pain Pain Assessment: Faces Faces Pain Scale: Hurts even more Pain Location: Lt LE  Pain Descriptors / Indicators: Grimacing;Guarding Pain Intervention(s): Monitored during session (RN aware.  pt refusing pain meds )     Hand Dominance     Extremity/Trunk Assessment Upper Extremity Assessment Upper Extremity Assessment: Overall WFL for tasks assessed   Lower Extremity Assessment Lower Extremity Assessment: Defer to PT evaluation   Cervical / Trunk Assessment Cervical / Trunk Assessment: Normal   Communication  Communication Communication: No difficulties   Cognition Arousal/Alertness: Awake/alert Behavior During Therapy: WFL for tasks assessed/performed Overall Cognitive Status: Within Functional Limits for tasks assessed                     General Comments       Exercises       Shoulder Instructions      Home Living Family/patient expects to be discharged to:: Private residence Living Arrangements: Parent Available Help at Discharge: Family;Available PRN/intermittently (sister and father live nearby) Type of Home: House Home  Access: Stairs to enter [employer] of Steps: 2 Entrance Stairs-Rails: Left;Right Home Layout: One level     Bathroom Shower/Tub: Tub/shower unit;Door Shower/tub characteristics: Door Firefighter: Standard     Home Equipment: Environmental consultant - 2 wheels;Crutches;Adaptive equipment Adaptive Equipment: Reacher Additional Comments: Pt lives at home w/ his mother.  PTA pt was doing all of the housework, cooking, cleaning.  Pt reports his sister lives nearby and can come over prn to assist w/ these tasks.      Prior Functioning/Environment Level of Independence: Independent             OT Diagnosis: Generalized weakness;Acute pain   OT Problem List:     OT Treatment/Interventions:      OT Goals(Current goals can be found in the care plan section) Acute Rehab OT Goals Patient Stated Goal: to go home ASAP OT Goal Formulation: All assessment and education complete, DC therapy  OT Frequency:     Barriers to D/C:            Co-evaluation              End of Session Equipment Utilized During Treatment: Rolling walker Nurse Communication: Mobility status  Activity Tolerance: Patient tolerated treatment well Patient left: in chair;with call bell/phone within reach   Time: 1610-9604 OT Time Calculation (min): 14 min Charges:  OT General Charges $OT Visit: 1 Procedure OT Evaluation $Initial OT Evaluation Tier I: 1 Procedure G-Codes:    [name] M 11-28-2014, 4:24 PM

## 2014-11-19 NOTE — Progress Notes (Signed)
VASCULAR LAB PRELIMINARY  ARTERIAL  ABI completed:    RIGHT    LEFT    PRESSURE WAVEFORM  PRESSURE WAVEFORM  BRACHIAL 117 Triphasic  BRACHIAL 113 Triphasic   DP   DP 113 Monophasic   AT 101 Monophasic  AT    PT >255 Triphasic  PT 133 Monophasic   PER   PER    GREAT TOE  NA GREAT TOE  NA    RIGHT LEFT  ABI calcified 1.14     Hennessy Bartel, RVT 11/19/2014, 8:40 AM

## 2014-11-19 NOTE — Progress Notes (Signed)
  Progress Note    11/19/2014 7:23 AM 2 Days Post-Op  Subjective:  "I feel like crap".  Coughing up a lot of yellow phlegm.  Tm 99.1 now afebrile HR 80's NSR 90's-110's systolic 99% RA  Filed Vitals:   11/19/14 0420  BP: 110/71  Pulse: 85  Temp: 98.4 F (36.9 C)  Resp: 20    Physical Exam: Cardiac:  regular Lungs:  Non labored Incisions:  Left groin is healing nicely; other incisions are clean and dry Extremities:  Brisk left DP doppler signal; monophasic left peroneal   CBC    Component Value Date/Time   WBC 9.0 11/18/2014 0009   RBC 4.52 11/18/2014 0009   HGB 12.9* 11/18/2014 0009   HCT 40.5 11/18/2014 0009   PLT 148* 11/18/2014 0009   MCV 89.6 11/18/2014 0009   MCH 28.5 11/18/2014 0009   MCHC 31.9 11/18/2014 0009   RDW 16.0* 11/18/2014 0009   LYMPHSABS 1.2 09/22/2014 0539   MONOABS 0.5 09/22/2014 0539   EOSABS 0.1 09/22/2014 0539   BASOSABS 0.0 09/22/2014 0539    BMET    Component Value Date/Time   NA 137 11/18/2014 0009   K 4.3 11/18/2014 0009   CL 102 11/18/2014 0009   CO2 26 11/18/2014 0009   GLUCOSE 127* 11/18/2014 0009   BUN 19 11/18/2014 0009   CREATININE 1.08 11/18/2014 0009   CREATININE 0.80 01/12/2014 0848   CALCIUM 8.6* 11/18/2014 0009   GFRNONAA >60 11/18/2014 0009   GFRNONAA >89 01/12/2014 0848   GFRAA >60 11/18/2014 0009   GFRAA >89 01/12/2014 0848    INR    Component Value Date/Time   INR 1.22 11/06/2014 0914     Intake/Output Summary (Last 24 hours) at 11/19/14 0723 Last data filed at 11/19/14 0247  Gross per 24 hour  Intake    250 ml  Output   3300 ml  Net  -3050 ml     Assessment:  59 y.o. male is s/p:  1. Harvesting of left great saphenous vein 2. Left femoral to below knee popliteal artery bypass with 6 mm PTFE graft   2 Days Post-Op  Plan: -pt has patent bypass graft with brisk left DP doppler signal -DVT prophylaxis:  Lovenox -hyperglycemic-portion of insulin restarted yesterday.  I have restarted his  Metformin and Jardiance back this am -pt not feeling well and coughing up yellow phlegm-he is a smoker.  He is afebrile.  I have ordered an incentive spirometer for him.  Will get a PA/Lat CXR.  If okay, will d/c home later this morning -states he uses Santyl on his leg wounds at home-will order -will order Keflex as well-he will need this for one week.   Doreatha MassedSamantha Lamere Lightner, PA-C [facility] [phone] 11/19/2014 7:23 AM

## 2014-11-19 NOTE — Progress Notes (Signed)
Patient discharged reveiwed discharge instructions. Iv removed. Escorted via wheelchair.   Clorine Swing, [name] DaltonAnn Brooke Rn

## 2014-11-19 NOTE — Progress Notes (Signed)
Physical Therapy Treatment Patient Details Name: [name] MRN: [id number] DOB: 27-May-1955 Today's Date: 11/19/2014    History of Present Illness Pt is a 59 y/o F w/ previous Rt fem-pop bypass now s/p Lt fem-pop bypass.  Pt has wounds on Bil LEs.  Pt's PMH inlcudes ICD, chronic systolic heart failure, DM, CVD.    PT Comments    Progressing well. Should be safe in home environment with assist of his Mom.    Follow Up Recommendations  No PT follow up;Supervision - Intermittent     Equipment Recommendations  None recommended by PT    Recommendations for Other Services       Precautions / Restrictions Precautions Precautions: Fall    Mobility  Bed Mobility Overal bed mobility: Modified Independent             General bed mobility comments: slow and guarded  Transfers Overall transfer level: Needs assistance Equipment used: Rolling walker (2 wheeled) Transfers: Sit to/from Stand Sit to Stand: Supervision         General transfer comment: safe, but slow  Ambulation/Gait Ambulation/Gait assistance: Supervision Ambulation Distance (Feet): 250 Feet Assistive device: Rolling walker (2 wheeled) Gait Pattern/deviations: Step-through pattern Gait velocity: slower   General Gait Details: generally safe use of RW, but occasional instability with scannig   Stairs Stairs: Yes Stairs assistance: Supervision Stair Management: One rail Left;Step to pattern;Forwards Number of Stairs: 2 General stair comments: cues for proper technique  Wheelchair Mobility    Modified Rankin (Stroke Patients Only)       Balance   Sitting-balance support: No upper extremity supported Sitting balance-Leahy Scale: Good       Standing balance-Leahy Scale: Poor (to fair)                      Cognition Arousal/Alertness: Awake/alert Behavior During Therapy: WFL for tasks assessed/performed Overall Cognitive Status: Within Functional Limits for tasks assessed                       Exercises      General Comments General comments (skin integrity, edema, etc.): Encouraged to continue ambulation multiple times a day.      Pertinent Vitals/Pain Pain Assessment: Faces Faces Pain Scale: Hurts even more Pain Location: Lt groin Pain Descriptors / Indicators: Burning;Grimacing;Sore Pain Intervention(s): Monitored during session    Home Living                      Prior Function            PT Goals (current goals can now be found in the care plan section) Acute Rehab PT Goals Patient Stated Goal: to go home once ready PT Goal Formulation: With patient Time For Goal Achievement: 11/25/14 Potential to Achieve Goals: Good Progress towards PT goals: Progressing toward goals    Frequency  Min 3X/week    PT Plan Current plan remains appropriate    Co-evaluation             End of Session   Activity Tolerance: Patient limited by pain;Patient tolerated treatment well Patient left: in bed (sitting EIOB)     Time: 8295-6213 PT Time Calculation (min) (ACUTE ONLY): 14 min  Charges:  $Gait Training: 8-22 mins                    G Codes:      Naylea Wigington, Eliseo Gum 11/19/2014, 12:24 PM  11/19/2014  [place] BingKen Zeke Aker, PT [phone]  (pager)

## 2014-11-19 NOTE — Discharge Summary (Signed)
Discharge Summary     [name] [birth year] 59 y.o. male  [id number]  Admission Date: 11/17/2014  Discharge Date: 11/19/14  Physician: Chuck Hint, MD  Admission Diagnosis: Chronic venous insufficiency with left leg ulceration I87.2   HPI:   This is a 59 y.o. male who is status post previous right femoral to below-knee pop to artery bypass with a vein graft in November 2015. The patient subsequently developed wounds on the right foot with a scab over his medial malleolus on the left and a small wound over his lateral malleolus. He underwent an arteriogram which shows a superficial femoral artery occlusion on the left. He also had angioplasty of an iliac stenosis on the right above his femoropopliteal bypass graft.  He has wounds and venous stasis disease of his left leg which have not changed significantly. I think the poor healing is related to his known arterial disease. He has no symptoms on the right side.  Hospital Course:  The patient was admitted to the hospital and taken to the operating room on 11/17/2014 and underwent: 1. Harvesting of left great saphenous vein 2. Left femoral to below knee popliteal artery bypass with 6 mm PTFE graft    The pt tolerated the procedure well and was transported to the PACU in good condition.   By POD 1, the had a patent bypass graft with brisk DP doppler flow on the right.  He was transferred to 2 west.  Given he in on multiple medications for DM, a diabetic coordinator consult was obtained to help mange his diabetes.    By POD 2, he was not feeling well, however, by later that day, he had improved and wanted to go home.  He continued to have a brisk left DP doppler signal.  He did have some coughing up phlegm and CXR was ordered.   Overall unchanged with a small amount of bilateral pleural fluid.  He is to continue the Santyl on his leg wounds and is discharged with a week of Keflex for his wounds.  The remainder of the  hospital course consisted of increasing mobilization and increasing intake of solids without difficulty.  CBC    Component Value Date/Time   WBC 9.0 11/18/2014 0009   RBC 4.52 11/18/2014 0009   HGB 12.9* 11/18/2014 0009   HCT 40.5 11/18/2014 0009   PLT 148* 11/18/2014 0009   MCV 89.6 11/18/2014 0009   MCH 28.5 11/18/2014 0009   MCHC 31.9 11/18/2014 0009   RDW 16.0* 11/18/2014 0009   LYMPHSABS 1.2 09/22/2014 0539   MONOABS 0.5 09/22/2014 0539   EOSABS 0.1 09/22/2014 0539   BASOSABS 0.0 09/22/2014 0539    BMET    Component Value Date/Time   NA 137 11/18/2014 0009   K 4.3 11/18/2014 0009   CL 102 11/18/2014 0009   CO2 26 11/18/2014 0009   GLUCOSE 127* 11/18/2014 0009   BUN 19 11/18/2014 0009   CREATININE 1.08 11/18/2014 0009   CREATININE 0.80 01/12/2014 0848   CALCIUM 8.6* 11/18/2014 0009   GFRNONAA >60 11/18/2014 0009   GFRNONAA >89 01/12/2014 0848   GFRAA >60 11/18/2014 0009   GFRAA >89 01/12/2014 0848     Discharge Instructions    Call MD for:  redness, tenderness, or signs of infection (pain, swelling, bleeding, redness, odor or green/yellow discharge around incision site)    Complete by:  As directed      Call MD for:  severe or increased pain, loss or decreased feeling  in affected limb(s)    Complete by:  As directed      Call MD for:  temperature >100.5    Complete by:  As directed      Discharge instructions    Complete by:  As directed   Resume current wound care on left leg.     Discharge wound care:    Complete by:  As directed   Wash the groin wound with soap and water daily and pat dry. (No tub bath-only shower)  Then put a dry gauze or washcloth there to keep this area dry daily and as needed.  Do not use Vaseline or neosporin on your incisions.  Only use soap and water on your incisions and then protect and keep dry.     Driving Restrictions    Complete by:  As directed   No driving for 2 weeks     Lifting restrictions    Complete by:  As directed     No lifting for 4 weeks     Resume previous diet    Complete by:  As directed            Discharge Diagnosis:  Chronic venous insufficiency with left leg ulceration I87.2  Secondary Diagnosis: Patient Active Problem List   Diagnosis Date Noted  . UTI (urinary tract infection) 09/23/2014  . Infected open wound 09/22/2014  . Type 2 diabetes mellitus (HCC) 09/22/2014  . Skin ulcer (HCC) 09/21/2014  . Type 2 diabetes mellitus with hyperglycemia (HCC)   . PAD (peripheral artery disease) (HCC) 11/07/2013  . PVD (peripheral vascular disease) (HCC)   . Pre-operative cardiovascular examination   . Cellulitis of right foot 11/03/2013  . Cellulitis of right lower extremity 11/03/2013  . Metabolic alkalosis 12/07/2012  . Acute on chronic systolic heart failure (HCC) 12/03/2012  . Upper respiratory infection 10/01/2012  . Hypotension 10/01/2012  . Carotid stenosis 10/25/2011  . Hypertension 08/09/2010  . Automatic implantable cardioverter-defibrillator in [employer] 08/09/2010  . Coronary atherosclerosis 01/21/2009  . AODM 05/15/2008  . TOBACCO ABUSE 05/15/2008  . CARDIOMYOPATHY, ISCHEMIC 05/15/2008  . Cerebrovascular disease 05/15/2008  . Peripheral vascular disease (HCC) 05/15/2008  . CHEST PAIN 05/15/2008  . Hyperlipidemia 01/22/2008  . SYSTOLIC HEART FAILURE, CHRONIC 01/22/2008   Past Medical History  Diagnosis Date  . Hyperlipidemia     mixed  . Chronic systolic heart failure (HCC)   . ICD (implantable cardiac defibrillator), dual, in situ     [employer] Jude for severe LVD EF 25% 2/07 explanted 2010. Medtronic Virtuoso II DR Dual-chamber cardiverter-defibrillator  with pocket revision, Dr. Graciela Husbands  . Tobacco abuse   . Ischemic cardiomyopathy     a. Echo (09/27/12): EF 20%, diffuse HK with periapical AK, no LV thrombus noted, restrictive physiology, trivial MR, mild LAE, RVSF mildly reduced, PASP 39  . Cerebrovascular disease   . Diabetes mellitus   . HTN (hypertension)   . PVD  (peripheral vascular disease) (HCC)     a. ABI 09/2011 - R normal, L moderate - saw Dr. Kirke Corin - med rx.   . Carotid artery disease (HCC)     a. Dopp 09/2011: 60-79% RICA, 40-59% LICA.;  b.  Carotid US (7/14):  Bilateral 60-79% => f/u 6 mos  . Coronary artery disease     a. AWMI requiring IABP 2005 s/p Horizon study stent-LAD, staged BMS-Cx, DESx2-RCA. b. Last Last LHC (6/06):  EF 25%, pLAD stent 40-50, after stent 40, dLAD 20, pCFX 20, pOM1 70, pRCA 20,  mRCA stents ok.=> med Rx;  c.  Myoview (7/14):  EF 26%, large ant, septal, inf and apical infarct, no ischemia.  Med Rx continued  . RIATA ICD Lead--on advisory recall    . Presence of permanent cardiac pacemaker   . AICD (automatic cardioverter/defibrillator) present   . Heart murmur   . History of kidney stones        Medication List    TAKE these medications        aspirin 81 MG tablet  Take 1 tablet (81 mg total) by mouth daily.     atorvastatin 80 MG tablet  Commonly known as:  LIPITOR  Take 1 tablet (80 mg total) by mouth every other day.     benazepril 5 MG tablet  Commonly known as:  LOTENSIN  Take 1 tablet (5 mg total) by mouth daily.     carvedilol 6.25 MG tablet  Commonly known as:  COREG  Take 1 tablet (6.25 mg total) by mouth 2 (two) times daily with a meal.     cephALEXin 500 MG capsule  Commonly known as:  KEFLEX  Take 1 capsule (500 mg total) by mouth every 8 (eight) hours.     ezetimibe 10 MG tablet  Commonly known as:  ZETIA  Take 1 tablet (10 mg total) by mouth daily.     insulin NPH Human 100 UNIT/ML injection  Commonly known as:  HUMULIN N,NOVOLIN N  Inject 10 Units into the skin daily.     insulin NPH-regular Human (70-30) 100 UNIT/ML injection  Commonly known as:  NOVOLIN 70/30  Inject 16-22 Units into the skin 2 (two) times daily with a meal. 26 units in the morning, 16 units in the evening     JARDIANCE 25 MG Tabs tablet  Generic drug:  empagliflozin  Take 25 mg by mouth daily.     metFORMIN  500 MG tablet  Commonly known as:  GLUCOPHAGE  Take 500 mg by mouth daily with breakfast.     oxyCODONE 5 MG immediate release tablet  Commonly known as:  ROXICODONE  Take 1 tablet (5 mg total) by mouth every 6 (six) hours as needed.     spironolactone 25 MG tablet  Commonly known as:  ALDACTONE  TAKE ONE-HALF TABLET BY MOUTH ONCE DAILY     torsemide 20 MG tablet  Commonly known as:  DEMADEX  Take 3 tablets (60 mg total) by mouth once.        Prescriptions given: 1.  Roxicodone #30 No Refill  Instructions: 1.  Wash the groin wound with soap and water daily and pat dry. (No tub bath-only shower)  Then put a dry gauze or washcloth there to keep this area dry daily and as needed.  Do not use Vaseline or neosporin on your incisions.  Only use soap and water on your incisions and then protect and keep dry.  Disposition: home  Patient's condition: is Good  Follow up: 1. Dr. Edilia Bo in 2 weeks   Doreatha Massed, PA-C [facility] [phone] 11/19/2014  7:55 AM  - For VQI Registry use --- Instructions: Press F2 to tab through selections.  Delete question if not applicable.   Post-op:  Wound infection: No  Graft infection: No  Transfusion: No  If yes, n/a units given New Arrhythmia: No Ipsilateral amputation: No,  Minor,  BKA,  AKA Discharge patency: [x ] Primary,  Primary assisted,  Secondary,  Occluded Patency judged by: [  x ] Dopper only, [ ]  Palpable graft pulse, []  Palpable distal pulse, [ ]  ABI inc. > 0.15, [ ]  Duplex Discharge ABI: R calcified, L 1.14 D/C Ambulatory Status: Ambulatory  Complications: MI: No, [ ]  Troponin only, [ ]  EKG or Clinical CHF: No Resp failure:No, [ ]  Pneumonia, [ ]  Ventilator Chg in renal function: No, [ ]  Inc. Cr > 0.5, [ ]  Temp. Dialysis, [ ]  Permanent dialysis Stroke: No, [ ]  Minor, [ ]  Major Return to OR: No  Reason for return to OR: [ ]  Bleeding, [ ]  Infection, [ ]  Thrombosis, [ ]   Revision  Discharge medications: Statin use:  yes ASA use:  yes Plavix use:  no Beta blocker use: yes ACEI use:   yes ARB use:  no Coumadin use: no

## 2014-11-23 ENCOUNTER — Telehealth: Payer: Self-pay | Admitting: Vascular Surgery

## 2014-11-23 NOTE — Telephone Encounter (Addendum)
-----   Message from Sharee PimpleMarilyn K McChesney, RN sent at 11/18/2014  2:42 PM EST ----- Regarding: schedule   ----- Message -----    From: Dara LordsSamantha J Rhyne, PA-C    Sent: 11/18/2014   2:23 PM      To: [facility] Charge Pool  S/p left fem pop bypass grafting 11/17/14.  F/u with Dr. Edilia Boickson in 2 weeks.  Thanks, Samantha  notified patient of post op appt. on 12-02-14 at 10am with dr. Edilia Bodickson

## 2014-11-30 ENCOUNTER — Encounter: Payer: Self-pay | Admitting: Vascular Surgery

## 2014-12-02 ENCOUNTER — Ambulatory Visit (INDEPENDENT_AMBULATORY_CARE_PROVIDER_SITE_OTHER): Payer: BLUE CROSS/BLUE SHIELD | Admitting: Vascular Surgery

## 2014-12-02 ENCOUNTER — Other Ambulatory Visit (HOSPITAL_COMMUNITY): Payer: BLUE CROSS/BLUE SHIELD

## 2014-12-02 ENCOUNTER — Encounter (HOSPITAL_COMMUNITY): Payer: BLUE CROSS/BLUE SHIELD

## 2014-12-02 ENCOUNTER — Ambulatory Visit: Payer: BLUE CROSS/BLUE SHIELD | Admitting: Vascular Surgery

## 2014-12-02 ENCOUNTER — Encounter: Payer: Self-pay | Admitting: Vascular Surgery

## 2014-12-02 VITALS — BP 109/70 | HR 91 | Temp 98.2°F | Ht 72.0 in | Wt 214.0 lb

## 2014-12-02 DIAGNOSIS — Z48812 Encounter for surgical aftercare following surgery on the circulatory system: Secondary | ICD-10-CM

## 2014-12-02 NOTE — Progress Notes (Signed)
Patient name: [name] MRN: [id number] DOB: Jul 10, 1955 Sex: male  REASON FOR VISIT: Follow up after left them to below-knee popliteal artery bypass.  HPI: [name] is a 59 y.o. male who presented with multiple venous stasis ulcers of the left leg and was found to have severe infrainguinal arterial occlusive disease. His only runoff is the peroneal artery. I harvested his left great saphenous vein however this was not adequate. Therefore he underwent a left femoral to below knee popliteal artery bypass with a 6 mm PTFE graft on 11/17/2014. He comes in for a routine follow up visit.  Unfortunately, he does continue to smoke. He has been doing the dressing changes himself. He states that the left foot feels better.  Current Outpatient Prescriptions  Medication Sig Dispense Refill  . aspirin 81 MG tablet Take 1 tablet (81 mg total) by mouth daily. 30 tablet 3  . atorvastatin (LIPITOR) 80 MG tablet Take 1 tablet (80 mg total) by mouth every other day. (Patient taking differently: Take 80 mg by mouth daily. ) 30 tablet 11  . benazepril (LOTENSIN) 5 MG tablet Take 1 tablet (5 mg total) by mouth daily. 30 tablet 6  . carvedilol (COREG) 6.25 MG tablet Take 1 tablet (6.25 mg total) by mouth 2 (two) times daily with a meal. (Patient taking differently: Take 3.125 mg by mouth 2 (two) times daily with a meal. ) 60 tablet 2  . cephALEXin (KEFLEX) 500 MG capsule Take 1 capsule (500 mg total) by mouth every 8 (eight) hours. 21 capsule 0  . ezetimibe (ZETIA) 10 MG tablet Take 1 tablet (10 mg total) by mouth daily. 30 tablet 6  . insulin NPH Human (HUMULIN N,NOVOLIN N) 100 UNIT/ML injection Inject 10 Units into the skin daily.     . insulin NPH-regular Human (NOVOLIN 70/30) (70-30) 100 UNIT/ML injection Inject 16-22 Units into the skin 2 (two) times daily with a meal. 26 units in the morning, 16 units in the evening    . JARDIANCE 25 MG TABS tablet Take 25 mg by mouth daily.    . metFORMIN (GLUCOPHAGE)  500 MG tablet Take 500 mg by mouth daily with breakfast.    . oxyCODONE (ROXICODONE) 5 MG immediate release tablet Take 1 tablet (5 mg total) by mouth every 6 (six) hours as needed. 30 tablet 0  . spironolactone (ALDACTONE) 25 MG tablet TAKE ONE-HALF TABLET BY MOUTH ONCE DAILY 30 tablet 0  . torsemide (DEMADEX) 20 MG tablet Take 3 tablets (60 mg total) by mouth once. (Patient taking differently: Take 60 mg by mouth daily. ) 90 tablet 11   No current facility-administered medications for this visit.    REVIEW OF SYSTEMS:   denotes positive finding,  denotes negative finding Cardiac  Comments:  Chest pain or chest pressure:    Shortness of breath upon exertion:    Short of breath when lying flat:    Irregular heart rhythm:    Constitutional    Fever or chills:      PHYSICAL EXAM: Filed Vitals:   12/02/14 0954  BP: 109/70  Pulse: 91  Temp: 98.2 F (36.8 C)  TempSrc: Oral  Height: 6' (1.829 m)  Weight: 214 lb (97.07 kg)  SpO2: 100%    GENERAL: The patient is a well-nourished male, in no acute distress. The vital signs are documented above. CARDIOVASCULAR: There is a regular rate and rhythm. PULMONARY: There is good air exchange bilaterally without wheezing or rales. He has brisk  Doppler flow in the left peroneal artery. He also has a monophasic anterior tibial signal. The venous stasis ulcers on his left leg look about the same.  MEDICAL ISSUES:  COMBINED INFRAINGUINAL ARTERIAL OCCLUSIVE DISEASE AND CHRONIC VENOUS INSUFFICIENCY: His bypass graft is patent. He only has peroneal runoff. We have discussed the importance of tobacco cessation, intermittent leg elevation, and continuing aggressive wound care. I'll see him back in 3 months with a duplex of his graft and ABIs. HEENT is to call sooner if he has problems.  [name], [name] [facility] [place]: [phone]

## 2014-12-23 ENCOUNTER — Other Ambulatory Visit (HOSPITAL_COMMUNITY): Payer: Self-pay | Admitting: Internal Medicine

## 2014-12-30 ENCOUNTER — Encounter (HOSPITAL_COMMUNITY): Payer: Self-pay | Admitting: *Deleted

## 2014-12-30 ENCOUNTER — Emergency Department (HOSPITAL_COMMUNITY): Payer: BLUE CROSS/BLUE SHIELD

## 2014-12-30 ENCOUNTER — Inpatient Hospital Stay (HOSPITAL_COMMUNITY)
Admission: EM | Admit: 2014-12-30 | Discharge: 2015-01-05 | DRG: 292 | Disposition: A | Discharge status: Home / Self Care | Payer: BLUE CROSS/BLUE SHIELD | Attending: Internal Medicine | Admitting: Internal Medicine

## 2014-12-30 DIAGNOSIS — F17210 Nicotine dependence, cigarettes, uncomplicated: Secondary | ICD-10-CM | POA: Diagnosis present

## 2014-12-30 DIAGNOSIS — E1165 Type 2 diabetes mellitus with hyperglycemia: Secondary | ICD-10-CM | POA: Diagnosis present

## 2014-12-30 DIAGNOSIS — Z7982 Long term (current) use of aspirin: Secondary | ICD-10-CM

## 2014-12-30 DIAGNOSIS — L03116 Cellulitis of left lower limb: Secondary | ICD-10-CM | POA: Diagnosis not present

## 2014-12-30 DIAGNOSIS — I5023 Acute on chronic systolic (congestive) heart failure: Secondary | ICD-10-CM

## 2014-12-30 DIAGNOSIS — I2510 Atherosclerotic heart disease of native coronary artery without angina pectoris: Secondary | ICD-10-CM | POA: Diagnosis present

## 2014-12-30 DIAGNOSIS — I679 Cerebrovascular disease, unspecified: Secondary | ICD-10-CM | POA: Diagnosis present

## 2014-12-30 DIAGNOSIS — Z955 Presence of coronary angioplasty implant and graft: Secondary | ICD-10-CM

## 2014-12-30 DIAGNOSIS — I10 Essential (primary) hypertension: Secondary | ICD-10-CM | POA: Diagnosis present

## 2014-12-30 DIAGNOSIS — L97329 Non-pressure chronic ulcer of left ankle with unspecified severity: Secondary | ICD-10-CM | POA: Diagnosis present

## 2014-12-30 DIAGNOSIS — I255 Ischemic cardiomyopathy: Secondary | ICD-10-CM | POA: Diagnosis present

## 2014-12-30 DIAGNOSIS — M25562 Pain in left knee: Secondary | ICD-10-CM | POA: Diagnosis present

## 2014-12-30 DIAGNOSIS — R52 Pain, unspecified: Secondary | ICD-10-CM | POA: Diagnosis not present

## 2014-12-30 DIAGNOSIS — I872 Venous insufficiency (chronic) (peripheral): Secondary | ICD-10-CM | POA: Diagnosis present

## 2014-12-30 DIAGNOSIS — Z6832 Body mass index (BMI) 32.0-32.9, adult: Secondary | ICD-10-CM | POA: Diagnosis not present

## 2014-12-30 DIAGNOSIS — Z9119 Patient's noncompliance with other medical treatment and regimen: Secondary | ICD-10-CM

## 2014-12-30 DIAGNOSIS — E876 Hypokalemia: Secondary | ICD-10-CM | POA: Diagnosis present

## 2014-12-30 DIAGNOSIS — I739 Peripheral vascular disease, unspecified: Secondary | ICD-10-CM | POA: Diagnosis not present

## 2014-12-30 DIAGNOSIS — J90 Pleural effusion, not elsewhere classified: Secondary | ICD-10-CM | POA: Diagnosis not present

## 2014-12-30 DIAGNOSIS — R739 Hyperglycemia, unspecified: Secondary | ICD-10-CM

## 2014-12-30 DIAGNOSIS — L8990 Pressure ulcer of unspecified site, unspecified stage: Secondary | ICD-10-CM | POA: Diagnosis present

## 2014-12-30 DIAGNOSIS — E785 Hyperlipidemia, unspecified: Secondary | ICD-10-CM | POA: Diagnosis present

## 2014-12-30 DIAGNOSIS — Z888 Allergy status to other drugs, medicaments and biological substances status: Secondary | ICD-10-CM

## 2014-12-30 DIAGNOSIS — I5043 Acute on chronic combined systolic (congestive) and diastolic (congestive) heart failure: Principal | ICD-10-CM

## 2014-12-30 DIAGNOSIS — Z95810 Presence of automatic (implantable) cardiac defibrillator: Secondary | ICD-10-CM | POA: Diagnosis present

## 2014-12-30 DIAGNOSIS — I509 Heart failure, unspecified: Secondary | ICD-10-CM | POA: Diagnosis not present

## 2014-12-30 DIAGNOSIS — Z794 Long term (current) use of insulin: Secondary | ICD-10-CM

## 2014-12-30 DIAGNOSIS — F17200 Nicotine dependence, unspecified, uncomplicated: Secondary | ICD-10-CM

## 2014-12-30 DIAGNOSIS — R0600 Dyspnea, unspecified: Secondary | ICD-10-CM

## 2014-12-30 LAB — COMPREHENSIVE METABOLIC PANEL
ALBUMIN: 2.9 g/dL — AB (ref 3.5–5.0)
ALT: 19 U/L (ref 17–63)
AST: 23 U/L (ref 15–41)
Alkaline Phosphatase: 130 U/L — ABNORMAL HIGH (ref 38–126)
Anion gap: 11 (ref 5–15)
BUN: 22 mg/dL — ABNORMAL HIGH (ref 6–20)
CHLORIDE: 97 mmol/L — AB (ref 101–111)
CO2: 28 mmol/L (ref 22–32)
CREATININE: 1.04 mg/dL (ref 0.61–1.24)
Calcium: 8.8 mg/dL — ABNORMAL LOW (ref 8.9–10.3)
GFR calc non Af Amer: >60 mL/min (ref >60)
GLUCOSE: 301 mg/dL — AB (ref 65–99)
Potassium: 3.7 mmol/L (ref 3.5–5.1)
SODIUM: 136 mmol/L (ref 135–145)
Total Bilirubin: 1.1 mg/dL (ref 0.3–1.2)
Total Protein: 7.4 g/dL (ref 6.5–8.1)

## 2014-12-30 LAB — GLUCOSE, CAPILLARY
GLUCOSE-CAPILLARY: 218 mg/dL — AB (ref 65–99)
GLUCOSE-CAPILLARY: 231 mg/dL — AB (ref 65–99)

## 2014-12-30 LAB — CBC WITH DIFFERENTIAL/PLATELET
BASOS ABS: 0 10*3/uL (ref 0.0–0.1)
BASOS PCT: 1 %
EOS ABS: 0.1 10*3/uL (ref 0.0–0.7)
EOS PCT: 2 %
HCT: 40.9 % (ref 39.0–52.0)
Hemoglobin: 12.4 g/dL — ABNORMAL LOW (ref 13.0–17.0)
Lymphocytes Relative: 11 %
Lymphs Abs: 0.9 10*3/uL (ref 0.7–4.0)
MCH: 25.6 pg — AB (ref 26.0–34.0)
MCHC: 30.3 g/dL (ref 30.0–36.0)
MCV: 84.3 fL (ref 78.0–100.0)
Monocytes Absolute: 0.8 10*3/uL (ref 0.1–1.0)
Monocytes Relative: 9 %
NEUTROS PCT: 78 %
Neutro Abs: 6.4 10*3/uL (ref 1.7–7.7)
PLATELETS: 165 10*3/uL (ref 150–400)
RBC: 4.85 MIL/uL (ref 4.22–5.81)
RDW: 17.3 % — ABNORMAL HIGH (ref 11.5–15.5)
WBC: 8.2 10*3/uL (ref 4.0–10.5)

## 2014-12-30 LAB — I-STAT TROPONIN, ED: Troponin i, poc: 0.06 ng/mL (ref 0.00–0.08)

## 2014-12-30 LAB — BRAIN NATRIURETIC PEPTIDE: B Natriuretic Peptide: 862.8 pg/mL — ABNORMAL HIGH (ref 0.0–100.0)

## 2014-12-30 MED ORDER — ONDANSETRON HCL 4 MG/2ML IJ SOLN
4.0000 mg | Freq: Four times a day (QID) | INTRAMUSCULAR | Status: DC | PRN
Start: 2014-12-30 — End: 2015-01-05

## 2014-12-30 MED ORDER — SODIUM CHLORIDE 0.9 % IV SOLN: 250.0000 mL | INTRAVENOUS | Status: DC | PRN

## 2014-12-30 MED ORDER — FUROSEMIDE 10 MG/ML IJ SOLN
80.0000 mg | Freq: Two times a day (BID) | INTRAMUSCULAR | Status: DC
  Administered 2014-12-30 – 2015-01-01 (×4): 80 mg via INTRAVENOUS
  Filled 2014-12-30 (×4): qty 8

## 2014-12-30 MED ORDER — BENAZEPRIL HCL 5 MG PO TABS
5.0000 mg | ORAL_TABLET | Freq: Every day | ORAL | Status: DC
  Administered 2014-12-30 – 2015-01-05 (×7): 5 mg via ORAL
  Filled 2014-12-30 (×7): qty 1

## 2014-12-30 MED ORDER — ACETAMINOPHEN 325 MG PO TABS: 650.0000 mg | ORAL_TABLET | ORAL | Status: DC | PRN

## 2014-12-30 MED ORDER — ENOXAPARIN SODIUM 30 MG/0.3ML ~~LOC~~ SOLN
30.0000 mg | SUBCUTANEOUS | Status: DC
  Administered 2014-12-30 – 2015-01-03 (×5): 30 mg via SUBCUTANEOUS
  Filled 2014-12-30 (×4): qty 0.3

## 2014-12-30 MED ORDER — INSULIN ASPART 100 UNIT/ML ~~LOC~~ SOLN
0.0000 [IU] | Freq: Every day | SUBCUTANEOUS | Status: DC
  Administered 2014-12-30 – 2015-01-04 (×5): 2 [IU] via SUBCUTANEOUS

## 2014-12-30 MED ORDER — INSULIN ASPART 100 UNIT/ML ~~LOC~~ SOLN
0.0000 [IU] | Freq: Three times a day (TID) | SUBCUTANEOUS | Status: DC
  Administered 2014-12-31: 5 [IU] via SUBCUTANEOUS
  Administered 2014-12-31: 3 [IU] via SUBCUTANEOUS
  Administered 2014-12-31 – 2015-01-01 (×2): 5 [IU] via SUBCUTANEOUS
  Administered 2015-01-01: 3 [IU] via SUBCUTANEOUS
  Administered 2015-01-01: 8 [IU] via SUBCUTANEOUS
  Administered 2015-01-02: 3 [IU] via SUBCUTANEOUS
  Administered 2015-01-02 (×2): 5 [IU] via SUBCUTANEOUS
  Administered 2015-01-03: 3 [IU] via SUBCUTANEOUS
  Administered 2015-01-03: 5 [IU] via SUBCUTANEOUS
  Administered 2015-01-03: 3 [IU] via SUBCUTANEOUS
  Administered 2015-01-04 (×2): 8 [IU] via SUBCUTANEOUS
  Administered 2015-01-04: 3 [IU] via SUBCUTANEOUS
  Administered 2015-01-05: 8 [IU] via SUBCUTANEOUS
  Administered 2015-01-05: 5 [IU] via SUBCUTANEOUS

## 2014-12-30 MED ORDER — CARVEDILOL 3.125 MG PO TABS
3.1250 mg | ORAL_TABLET | Freq: Two times a day (BID) | ORAL | Status: DC
  Administered 2014-12-30 – 2015-01-05 (×12): 3.125 mg via ORAL
  Filled 2014-12-30 (×12): qty 1

## 2014-12-30 MED ORDER — ATORVASTATIN CALCIUM 80 MG PO TABS
80.0000 mg | ORAL_TABLET | Freq: Every evening | ORAL | Status: DC
Start: 2014-12-30 — End: 2015-01-05
  Administered 2014-12-30 – 2015-01-04 (×6): 80 mg via ORAL
  Filled 2014-12-30 (×6): qty 1

## 2014-12-30 MED ORDER — ASPIRIN 81 MG PO CHEW
81.0000 mg | CHEWABLE_TABLET | Freq: Every day | ORAL | Status: DC
  Administered 2014-12-30 – 2015-01-05 (×7): 81 mg via ORAL
  Filled 2014-12-30 (×6): qty 1

## 2014-12-30 MED ORDER — SODIUM CHLORIDE 0.9 % IJ SOLN: 3.0000 mL | INTRAMUSCULAR | Status: DC | PRN

## 2014-12-30 MED ORDER — EZETIMIBE 10 MG PO TABS
10.0000 mg | ORAL_TABLET | Freq: Every day | ORAL | Status: DC
  Administered 2014-12-30 – 2015-01-05 (×7): 10 mg via ORAL
  Filled 2014-12-30 (×6): qty 1

## 2014-12-30 MED ORDER — FUROSEMIDE 10 MG/ML IJ SOLN
80.0000 mg | Freq: Once | INTRAMUSCULAR | Status: AC
  Administered 2014-12-30: 80 mg via INTRAVENOUS
  Filled 2014-12-30: qty 8

## 2014-12-30 MED ORDER — SODIUM CHLORIDE 0.9 % IJ SOLN
3.0000 mL | Freq: Two times a day (BID) | INTRAMUSCULAR | Status: DC
  Administered 2014-12-30 – 2015-01-05 (×13): 3 mL via INTRAVENOUS

## 2014-12-30 NOTE — ED Notes (Signed)
Pt reports SOB for several days. Pt states that he noticed leg swelling and abdominal swelling. Pt states that SOB is worse with exertion.

## 2014-12-30 NOTE — H&P (Signed)
[facility] History and Physical  Lynda RainwaterLarry D Swiger ZOX:[id number] DOB: [birth year] DOA: 12/30/2014  Referring physician: Clydene Pughknott PCP: Neldon LabellaMILLER,[name], MD   Chief Complaint: sob lower extremity edema  HPI: Lynda RainwaterLarry D Arko is a 59 y.o. male with a past medical history lose chronic systolic heart failure, chronic venous insufficiency, implantable defibrillator, CAD, with recent below knee popliteal artery bypass presents emergency Department chief complaint worsening shortness of breath and lower extremity edema.  Information is obtained from the patient. He reports for the last 3-4 days lower extremity edema has gotten progressively worse. Today he felt like edema had spread up past his knees into the stomach. Associated symptoms include worsening shortness of breath and dyspnea on exertion. He denies chest pain palpitations headache dizziness syncope or near-syncope. He denies any fever chills nausea vomiting dysuria hematuria frequency or urgency.  In the emergency department he is afebrile hemodynamically stable and not hypoxic. Review of Systems:  10 point review of systems completed all systems are negative except as indicated in the history of present illness  Past Medical History  Diagnosis Date  . Hyperlipidemia     mixed  . Chronic systolic heart failure (HCC)   . ICD (implantable cardiac defibrillator), dual, in situ     St. Jude for severe LVD EF 25% 2/07 explanted 2010. Medtronic Virtuoso II DR Dual-chamber cardiverter-defibrillator  with pocket revision, Dr. Graciela HusbandsKlein  . Tobacco abuse   . Ischemic cardiomyopathy     a. Echo (09/27/12): EF 20%, diffuse HK with periapical AK, no LV thrombus noted, restrictive physiology, trivial MR, mild LAE, RVSF mildly reduced, PASP 39  . Cerebrovascular disease   . Diabetes mellitus   . HTN (hypertension)   . PVD (peripheral vascular disease) (HCC)     a. ABI 09/2011 - R normal, L moderate - saw Dr. Kirke CorinArida - med rx.   . Carotid artery disease  (HCC)     a. Dopp 09/2011: 60-79% RICA, 40-59% LICA.;  b.  Carotid US (7/14):  Bilateral 60-79% => f/u 6 mos  . Coronary artery disease     a. AWMI requiring IABP 2005 s/p Horizon study stent-LAD, staged BMS-Cx, DESx2-RCA. b. Last Last LHC (6/06):  EF 25%, pLAD stent 40-50, after stent 40, dLAD 20, pCFX 20, pOM1 70, pRCA 20, mRCA stents ok.=> med Rx;  c.  Myoview (7/14):  EF 26%, large ant, septal, inf and apical infarct, no ischemia.  Med Rx continued  . RIATA ICD Lead--on advisory recall    . Presence of permanent cardiac pacemaker   . AICD (automatic cardioverter/defibrillator) present   . Heart murmur   . History of kidney stones    Past Surgical History  Procedure Laterality Date  . Medtronic virtuoso ii dr dual-chamber cardioverter-defibrillation with pocket revison      Dr. Sherryl MangesSteven Klein  . Femoral-popliteal bypass graft Right 11/07/2013    Procedure: Right FEMORAL-POPLITEAL ARTERY Bypass ;  Surgeon: Chuck Hinthristopher S Dickson, MD;  [facility];  Service: Vascular;  Laterality: Right;  . Intraoperative arteriogram Right 11/07/2013    Procedure: INTRA OPERATIVE ARTERIOGRAM;  Surgeon: Chuck Hinthristopher S Dickson, MD;  [facility];  Service: Vascular;  Laterality: Right;  . Implantable cardioverter defibrillator (icd) generator change N/A 12/16/2012    Procedure: ICD GENERATOR CHANGE;  Surgeon: Duke SalviaSteven C Klein, MD;  [facility];  Service: Cardiovascular;  Laterality: N/A;  . Abdominal aortagram N/A 11/06/2013    Procedure: ABDOMINAL AORTAGRAM;  Surgeon: Chuck Hinthristopher S Dickson, MD;  [facility];  Service: Cardiovascular;  Laterality: N/A;  . Lower extremity angiogram Bilateral 11/06/2013    Procedure: LOWER EXTREMITY ANGIOGRAM;  Surgeon: Chuck Hint, MD;  Location: [facility] CATH LAB;  Service: Cardiovascular;  Laterality: Bilateral;  . Peripheral vascular catheterization N/A 09/25/2014    Procedure: Abdominal Aortogram;  Surgeon: Sherren Kerns, MD;  Location: [facility] INVASIVE CV  LAB;  Service: Cardiovascular;  Laterality: N/A;  . Appendectomy  2000    ruptured  . Femoral-popliteal bypass graft Left 11/17/2014    Procedure: BYPASS GRAFT FEMORAL-POPLITEAL ARTERY USING GORE PROPATEN X 80CM VASCULAR GRAFT;  Surgeon: Chuck Hint, MD;  Location: [facility] OR;  Service: Vascular;  Laterality: Left;   Social History:  reports that he has been smoking Cigarettes.  He has a 33 pack-year smoking history. He has never used smokeless tobacco. He reports that he does not drink alcohol or use illicit drugs. Reports noncompliance due to finances. This with his children independent with ADLs Allergies  Allergen Reactions  . Niacin Itching and Other (See Comments)    Flushing   . Metformin And Related Nausea And Vomiting    *Only the extended release*    Family History  Problem Relation Age of Onset  . Diabetes Mother   . Coronary artery disease Father      Prior to Admission medications   Medication Sig Start Date End Date Taking? Authorizing Provider  aspirin 81 MG tablet Take 1 tablet (81 mg total) by mouth daily. 10/07/12  Yes [name], MD  atorvastatin (LIPITOR) 80 MG tablet Take 1 tablet (80 mg total) by mouth every other day. Patient taking differently: Take 80 mg by mouth daily.  08/20/14  Yes Lewayne Bunting, MD  benazepril (LOTENSIN) 5 MG tablet Take 1 tablet (5 mg total) by mouth daily. 06/19/14  Yes Lewayne Bunting, MD  carvedilol (COREG) 6.25 MG tablet Take 1 tablet (6.25 mg total) by mouth 2 (two) times daily with a meal. Patient taking differently: Take 3.125 mg by mouth 2 (two) times daily with a meal.  11/12/13  Yes Richarda Overlie, MD  ezetimibe (ZETIA) 10 MG tablet Take 1 tablet (10 mg total) by mouth daily. 01/08/14  Yes [name], MD  ibuprofen (ADVIL,MOTRIN) 200 MG tablet Take 200 mg by mouth every 6 (six) hours as needed for moderate pain.   Yes Historical Provider, MD  insulin NPH Human (HUMULIN N,NOVOLIN N) 100 UNIT/ML injection Inject 10  Units into the skin daily.    Yes Historical Provider, MD  insulin NPH-regular Human (NOVOLIN 70/30) (70-30) 100 UNIT/ML injection Inject 16-26 Units into the skin 2 (two) times daily with a meal. 26 units in the morning, 16 units in the evening   Yes Historical Provider, MD  JARDIANCE 25 MG TABS tablet Take 25 mg by mouth daily. 03/02/14  Yes Historical Provider, MD  metFORMIN (GLUCOPHAGE) 500 MG tablet Take 500 mg by mouth daily with breakfast.   Yes Historical Provider, MD  spironolactone (ALDACTONE) 25 MG tablet TAKE ONE-HALF TABLET BY MOUTH ONCE DAILY 12/25/14  Yes Dolores Patty, MD  torsemide (DEMADEX) 20 MG tablet Take 3 tablets (60 mg total) by mouth once. Patient taking differently: Take 60 mg by mouth daily.  08/20/14  Yes Lewayne Bunting, MD  cephALEXin (KEFLEX) 500 MG capsule Take 1 capsule (500 mg total) by mouth every 8 (eight) hours. Patient not taking: Reported on 12/30/2014 11/19/14   Samantha J Rhyne, PA-C  oxyCODONE (ROXICODONE) 5 MG immediate release  tablet Take 1 tablet (5 mg total) by mouth every 6 (six) hours as needed. Patient not taking: Reported on 12/30/2014 11/19/14   Dara Lords, PA-C   Physical Exam: Filed Vitals:   12/30/14 1416 12/30/14 1419 12/30/14 1443 12/30/14 1501  BP:   117/75   Pulse: 88  101   Temp:  99.1 F (37.3 C) 98.1 F (36.7 C)   TempSrc:  Oral Oral   Resp:   18   Height:    6' (1.829 m)  Weight:    101.061 kg (222 lb 12.8 oz)  SpO2: 98%  100%     Wt Readings from Last 3 Encounters:  12/30/14 101.061 kg (222 lb 12.8 oz)  12/02/14 97.07 kg (214 lb)  11/17/14 91.7 kg (202 lb 2.6 oz)    General:  Appears calm and comfortable Eyes: PERRL, normal lids, irises & conjunctiva ENT: grossly normal hearing, lips & tongue Neck: no LAD, masses or thyromegaly Cardiovascular: RRR, +murmur. 2+ lower extremity edema standing beyond the knee up to the thigh. Telemetry: SR, no arrhythmias  Respiratory: CTA bilaterally, no w/r/r. Normal  respiratory effort. Abdomen: soft, ntnd, and what tight positive bowel sounds nontender to palpation Skin: no rash or induration seen on limited exam Musculoskeletal: grossly normal tone BUE/BLE Psychiatric: grossly normal mood and affect, speech fluent and appropriate Neurologic: grossly non-focal. speech clear facial symmetry           Labs on Admission:  Basic Metabolic Panel:  Recent Labs Lab 12/30/14 0829  NA 136  K 3.7  CL 97*  CO2 28  GLUCOSE 301*  BUN 22*  CREATININE 1.04  CALCIUM 8.8*   Liver Function Tests:  Recent Labs Lab 12/30/14 0829  AST 23  ALT 19  ALKPHOS 130*  BILITOT 1.1  PROT 7.4  ALBUMIN 2.9*   No results for input(s): LIPASE, AMYLASE in the last 168 hours. No results for input(s): AMMONIA in the last 168 hours. CBC:  Recent Labs Lab 12/30/14 0829  WBC 8.2  NEUTROABS 6.4  HGB 12.4*  HCT 40.9  MCV 84.3  PLT 165   Cardiac Enzymes: No results for input(s): CKTOTAL, CKMB, CKMBINDEX, TROPONINI in the last 168 hours.  BNP (last 3 results)  Recent Labs  12/30/14 0830  BNP 862.8*    ProBNP (last 3 results) No results for input(s): PROBNP in the last 8760 hours.  CBG: No results for input(s): GLUCAP in the last 168 hours.  Radiological Exams on Admission: Dg Chest 2 View  12/30/2014  CLINICAL DATA:  Leg swelling. EXAM: CHEST  2 VIEW COMPARISON:  11/19/2014. FINDINGS: Mediastinum and hilar structures normal. Cardiac pacer with lead tip over the right ventricle. Cardiomegaly with pulmonary vascular prominence and mild interstitial prominence consistent mild congestive heart failure. Right lower lobe dense atelectasis and or infiltrate. Spot right pleural effusion. No pneumothorax . IMPRESSION: 1. Cardiac pacer stable position. Cardiomegaly with pulmonary vascular prominence and bilateral interstitial prominence consistent congestive heart failure. Right pleural effusion noted. 2. Dense right lower lobe atelectasis and/or infiltrate .  Electronically Signed   By: Maisie Fus  Register   On: 12/30/2014 08:44    EKG: Independently reviewed. Sinus tachycardia Septal infarct , age undetermined Lateral infarct , age undetermined No significant change since last tracing  Assessment/Plan Principal Problem:   CHF, acute on chronic [facility]) Active Problems:   Hyperlipidemia   TOBACCO ABUSE   Peripheral vascular disease (HCC)   Hypertension   Automatic implantable cardioverter-defibrillator in St. Jude   Type  2 diabetes mellitus with hyperglycemia (HCC)   Acute on chronic heart failure (HCC)   Pleural effusion  #1. Acute on chronic systolic heart failure. Elevated BNP is x-ray with vascular congestion. Volume overload on exam. Last echo 2015 revealed ejection fraction 2025% grade 2 diastolic dysfunction. Note home medications include Demadex and Aldactone. -Admit to telemetry -IV Lasix -Monitor intake and output -Obtain daily weight -Continue Coreg  #2. Lower extremity edema. Patient with a history of peripheral vascular disease. Status post recent left femoral the knee popliteal artery bypass. He is afebrile no leukocytosis nontoxic appearing Some erythema and swelling greater on the left -No indication for antibiotics at this time -Reevaluate tomorrow once diuresis started -Wound consult  3. Diabetes. Home medications include 7030 metformin. -Hold metformin for now -Obtain a hemoglobin A1c -Use sliding scale insulin for optimal control  #4. HTN. Fair control emergency department -Continue home meds -Monitor  #5. Peripheral vascular disease. See #2. Recent left femoral below knee popliteal artery bypass. -Appears stable  #6. Implantable defibrillator St. Jude -Cardiology having device interrogated  7. Plerual effusion. Per chest x-ray -diruesis  as noted above -Repeat chest x-ray per cardiology (decubitus)   Cardiology    Code Status: full DVT Prophylaxis: Family Communication: brother Disposition Plan:  home 65 minutes  [facility]

## 2014-12-30 NOTE — Consult Note (Signed)
Patient ID: [name] MRN: [id number], DOB/AGE: 1955-11-30   Admit date: 12/30/2014   Primary Physician: Neldon LabellaMILLER,[name], MD Primary Cardiologist: Dr. Jens Somrenshaw EP: Dr. Graciela HusbandsKlein Referring physician Dr. Clydene PughKnott  HPI:  [name] is a 59 y.o. male with a complex medical history who presented 12/30/2014 to [facility] ED for evaluation of LLE swelling and SOB.   Hx of MI in 2005 req IABP tx with Horizon study stent to the LAD followed by staged BMS to the CFX and DES x 2 to RCA, Ischemic CM, systolic CHF, s/p AICD (replacement 12/2012 - st. Jude) , HTN, DM2, HL, carotid stenosis, PAD.  Last LHC (6/06): EF 25%, pLAD stent 40-50, after stent 40, dLAD 20, pCFX 20, pOM1 70, pRCA 20, mRCA stents ok. Last myoview (7/14): EF 26%, large ant, septal, inf and apical infarct, no ischemia. Med Rx continued. .  Last echocardiogram 5/15 showed an ejection fraction of 20-25%, grade 2 diastolic dysfunction; moderate LAE, mild RAE, moderate MR. There was restrictive physiology. He also was followed in congestive heart failure clinic. Right femoral pop 11/2013. Last device interrogation  04/2014 showed normal device function.  Carotid US 09/01/2014: 60-79% RICA (stable) and 40-59%- LICA => f/u in 1 year  Recently underwent 11/19/2014 Left femoral to below knee popliteal artery bypass with 6 mm PTFE graftwith harvesting of left great saphenous vein. Post surgery patient did present initially with improvement in left lower extremity wound. Patient has noticed aggressive worsening of left lower extremity edema since he started his job 2 weeks ago. Initially his edema improved with elevation of leg at work intermittently. However for the past 2-4 days his edema progressively get worse. Now he can feel belly tightness and also having exertional shortness of breath. His lead him to ED presentation for further evaluation. The patient denies chest pain, palpitation, orthopnea, PND, syncope, nausea, vomiting, melena or  blood in his stool.  Patient states that he is compliant with his medication. He takes torsemide 60 MG daily "occasionally takes 20 mg +40 mg daily or 20+20+20 mg daily).  He is taking Coreg 3.125 MG daily however listed as a 6.25 MG daily  In ED, BNP of 862.8, chest x-ray showed pulmonary vascular congestion and right pleural effusion. EKG showed sinus tachycardia at rate of 105 bpm, no acute ischemic changes. Blood glucose of 301.   Problem List  Past Medical History  Diagnosis Date  . Hyperlipidemia     mixed  . Chronic systolic heart failure (HCC)   . ICD (implantable cardiac defibrillator), dual, in situ     St. Jude for severe LVD EF 25% 2/07 explanted 2010. Medtronic Virtuoso II DR Dual-chamber cardiverter-defibrillator  with pocket revision, Dr. Graciela HusbandsKlein  . Tobacco abuse   . Ischemic cardiomyopathy     a. Echo (09/27/12): EF 20%, diffuse HK with periapical AK, no LV thrombus noted, restrictive physiology, trivial MR, mild LAE, RVSF mildly reduced, PASP 39  . Cerebrovascular disease   . Diabetes mellitus   . HTN (hypertension)   . PVD (peripheral vascular disease) (HCC)     a. ABI 09/2011 - R normal, L moderate - saw Dr. Kirke CorinArida - med rx.   . Carotid artery disease (HCC)     a. Dopp 09/2011: 60-79% RICA, 40-59% LICA.;  b.  Carotid US (7/14):  Bilateral 60-79% => f/u 6 mos  . Coronary artery disease     a. AWMI requiring IABP 2005 s/p Horizon study stent-LAD, staged BMS-Cx, DESx2-RCA. b. Last  Last LHC (6/06):  EF 25%, pLAD stent 40-50, after stent 40, dLAD 20, pCFX 20, pOM1 70, pRCA 20, mRCA stents ok.=> med Rx;  c.  Myoview (7/14):  EF 26%, large ant, septal, inf and apical infarct, no ischemia.  Med Rx continued  . RIATA ICD Lead--on advisory recall    . Presence of permanent cardiac pacemaker   . AICD (automatic cardioverter/defibrillator) present   . Heart murmur   . History of kidney stones     Past Surgical History  Procedure Laterality Date  . Medtronic virtuoso ii dr  dual-chamber cardioverter-defibrillation with pocket revison      Dr. Sherryl Manges  . Femoral-popliteal bypass graft Right 11/07/2013    Procedure: Right FEMORAL-POPLITEAL ARTERY Bypass ;  Surgeon: Chuck Hint, MD;  Location[facility];  Service: Vascular;  Laterality: Right;  . Intraoperative arteriogram Right 11/07/2013    Procedure: INTRA OPERATIVE ARTERIOGRAM;  Surgeon: Chuck Hint, MD;  Location: [facility] OR;  Service: Vascular;  Laterality: Right;  . Implantable cardioverter defibrillator (icd) generator change N/A 12/16/2012    Procedure: ICD GENERATOR CHANGE;  Surgeon: [name], MD;  Location: [facility] CATH LAB;  Service: Cardiovascular;  Laterality: N/A;  . Abdominal aortagram N/A 11/06/2013    Procedure: ABDOMINAL AORTAGRAM;  Surgeon: Chuck Hint, MD;  Location: [facility] CATH LAB;  Service: Cardiovascular;  Laterality: N/A;  . Lower extremity angiogram Bilateral 11/06/2013    Procedure: LOWER EXTREMITY ANGIOGRAM;  Surgeon: Chuck Hint, MD;  Location: [name] CATH LAB;  Service: Cardiovascular;  Laterality: Bilateral;  . Peripheral vascular catheterization N/A 09/25/2014    Procedure: Abdominal Aortogram;  Surgeon: Sherren Kerns, MD;  Location: [facility] INVASIVE CV LAB;  Service: Cardiovascular;  Laterality: N/A;  . Appendectomy  2000    ruptured  . Femoral-popliteal bypass graft Left 11/17/2014    Procedure: BYPASS GRAFT FEMORAL-POPLITEAL ARTERY USING GORE PROPATEN X 80CM VASCULAR GRAFT;  Surgeon: Chuck Hint, MD;  Location: [facility] OR;  Service: Vascular;  Laterality: Left;     Allergies  Allergies  Allergen Reactions  . Niacin Itching and Other (See Comments)    Flushing   . Metformin And Related Nausea And Vomiting    *Only the extended release*     Home Medications  Prior to Admission medications   Medication Sig Start Date End Date Taking? Authorizing Provider  aspirin 81 MG tablet Take 1 tablet (81 mg total) by mouth daily. 10/07/12  Yes [name], MD  atorvastatin (LIPITOR) 80 MG tablet Take 1 tablet (80 mg total) by mouth every other day. Patient taking differently: Take 80 mg by mouth daily.  08/20/14  Yes Lewayne Bunting, MD  benazepril (LOTENSIN) 5 MG tablet Take 1 tablet (5 mg total) by mouth daily. 06/19/14  Yes Lewayne Bunting, MD  carvedilol (COREG) 6.25 MG tablet Take 1 tablet (6.25 mg total) by mouth 2 (two) times daily with a meal. Patient taking differently: Take 3.125 mg by mouth 2 (two) times daily with a meal.  11/12/13  Yes Richarda Overlie, MD  ezetimibe (ZETIA) 10 MG tablet Take 1 tablet (10 mg total) by mouth daily. 01/08/14  Yes [name], MD  ibuprofen (ADVIL,MOTRIN) 200 MG tablet Take 200 mg by mouth every 6 (six) hours as needed for moderate pain.   Yes Historical Provider, MD  insulin NPH Human (HUMULIN N,NOVOLIN N) 100 UNIT/ML injection Inject 10 Units into the skin daily.    Yes Historical Provider, MD  insulin NPH-regular Human (NOVOLIN 70/30) (70-30) 100 UNIT/ML injection Inject 16-26 Units into the skin 2 (two) times daily with a meal. 26 units in the morning, 16 units in the evening   Yes Historical Provider, MD  JARDIANCE 25 MG TABS tablet Take 25 mg by mouth daily. 03/02/14  Yes Historical Provider, MD  metFORMIN (GLUCOPHAGE) 500 MG tablet Take 500 mg by mouth daily with breakfast.   Yes Historical Provider, MD  spironolactone (ALDACTONE) 25 MG tablet TAKE ONE-HALF TABLET BY MOUTH ONCE DAILY 12/25/14  Yes Dolores Patty, MD  torsemide (DEMADEX) 20 MG tablet Take 3 tablets (60 mg total) by mouth once. Patient taking differently: Take 60 mg by mouth daily.  08/20/14  Yes Lewayne Bunting, MD  cephALEXin (KEFLEX) 500 MG capsule Take 1 capsule (500 mg total) by mouth every 8 (eight) hours. Patient not taking: Reported on 12/30/2014 11/19/14   Samantha J Rhyne, PA-C  oxyCODONE (ROXICODONE) 5 MG immediate release tablet Take 1 tablet (5 mg total) by mouth every 6 (six) hours as needed. Patient not taking:  Reported on 12/30/2014 11/19/14   Dara Lords, PA-C    Family History  Family History  Problem Relation Age of Onset  . Diabetes Mother   . Coronary artery disease Father    Family Status  Relation Status Death Age  . Mother Alive   . Father Alive     Social History  Social History   Social History  . Marital Status: Single    Spouse Name: N/A  . Number of Children: N/A  . Years of Education: N/A   Occupational History  . [employer]    Full time   Social History Main Topics  . Smoking status: Current Some Day Smoker -- 1.00 packs/day for 33 years    Types: Cigarettes  . Smokeless tobacco: Never Used     Comment: smoker for 33 years  . Alcohol Use: No  . Drug Use: No     Comment: former Cocain, Acid, Marijuana - 25 years ago  . Sexual Activity: No   Other Topics Concern  . Not on file   Social History Narrative   Divorced.     All other systems reviewed and are otherwise negative except as noted above.  Physical Exam  Blood pressure 118/90, pulse 97, temperature 97.4 F (36.3 C), temperature source Oral, resp. rate 18, weight 215 lb (97.523 kg), SpO2 94 %.  General: Pleasant, morbidly obese male in  NAD, nasal cannula in place Psych: Normal affect. Neuro: Alert and oriented X 3. Moves all extremities spontaneously. HEENT: Normal  Neck: Supple without bruits.  JVD difficult to assess due to bread.  Lungs:  Resp regular and unlabored, CTA. Heart: RRR no s3, s4,. Systolic murmurs. Abdomen: Soft, non-tender, distended, BS + x 4.  Extremities: Chronic skin changes on both lower extremity. Nonhealing wound on right and left  Leg. Lower extremity edema above knee L > R. Erythema around L knee.   Labs  No results for input(s): CKTOTAL, CKMB, TROPONINI in the last 72 hours. Lab Results  Component Value Date   WBC 8.2 12/30/2014   HGB 12.4* 12/30/2014   HCT 40.9 12/30/2014   MCV 84.3 12/30/2014   PLT 165 12/30/2014    Recent Labs Lab  12/30/14 0829  NA 136  K 3.7  CL 97*  CO2 28  BUN 22*  CREATININE 1.04  CALCIUM 8.8*  PROT 7.4  BILITOT 1.1  ALKPHOS 130*  ALT  19  AST 23  GLUCOSE 301*   Lab Results  Component Value Date   CHOL 110 01/12/2014   HDL 47 01/12/2014   LDLCALC 53 01/12/2014   TRIG 52 01/12/2014   No results found for: DDIMER   Radiology/Studies  Dg Chest 2 View  12/30/2014  CLINICAL DATA:  Leg swelling. EXAM: CHEST  2 VIEW COMPARISON:  11/19/2014. FINDINGS: Mediastinum and hilar structures normal. Cardiac pacer with lead tip over the right ventricle. Cardiomegaly with pulmonary vascular prominence and mild interstitial prominence consistent mild congestive heart failure. Right lower lobe dense atelectasis and or infiltrate. Spot right pleural effusion. No pneumothorax . IMPRESSION: 1. Cardiac pacer stable position. Cardiomegaly with pulmonary vascular prominence and bilateral interstitial prominence consistent congestive heart failure. Right pleural effusion noted. 2. Dense right lower lobe atelectasis and/or infiltrate . Electronically Signed   By: Maisie Fus  Register   On: 12/30/2014 08:44    ECG  Vent. rate 105 BPM PR interval 174 ms QRS duration 114 ms QT/QTc 366/483 ms P-R-T axes 62 185 15   Echo: 05/28/2013 LV EF: 20% -  25%  ------------------------------------------------------------------- Indications:   CHF - 428.0.  ------------------------------------------------------------------- History:  PMH:  Chest pain. Coronary artery disease. Risk factors: Current tobacco use.  ------------------------------------------------------------------- Study Conclusions  - Left ventricle: The cavity size was mildly dilated. Wall thickness was normal. Systolic function was severely reduced. The estimated ejection fraction was in the range of 20% to 25%. Akinesis and scarring of the basal-midinferior myocardium; consistent with infarction. Akinesis and scarring of  the mid-apicalanteroseptal, anterior, anterolateral, and apical myocardium; consistent with infarction. Features are consistent with a pseudonormal left ventricular filling pattern, with concomitant abnormal relaxation and increased filling pressure (grade 2 diastolic dysfunction). No evidence of thrombus. - Mitral valve: There was moderate regurgitation directed centrally. - Left atrium: The atrium was moderately dilated. - Right atrium: The atrium was mildly dilated. - Pericardium, extracardiac: There was a right pleural effusion.   ASSESSMENT AND PLAN  1. Acute on chronic systolic heart failure -  BNP of 862.8. Chest x-ray with vascular congestion. Volume overloaded on exam.   Last echocardiogram 5/15 showed an ejection fraction of 20-25%, grade 2 diastolic dysfunction; moderate LAE, mild RAE, moderate MR. There was restrictive physiology.   2. Chronic venous insufficiency - Hx of right femoral pop 11/2013. - 11/19/2014 Left femoral to below knee popliteal artery bypass with 6 mm PTFE graftwith harvesting of left great saphenous vein. - Has non healing ulcers with erythema.   3. Ischemic cardiomyopathy - Prior stenting of LAD/RCA and LCx with last Cath 2006 - Last myoview was in 07/2012 with large, severe, fixed anterior, septal, inferior and apical infarct; no ischemia, EF 26%  - No anginal pain   4. Implantable defibrillator St. Jude  - Last device check 04/2014 showed normal device function.  - Will interrogate device  5. Carotid artery disease - Carotid US 09/01/2014: 60-79% RICA (stable) and 40-59%- LICA => f/u in 1 year  6. DM - Last A1c 9.6 11/17/2014 - Blood sugar of 301 today. Will benefit from outpatient endocrinology evaluation.   Signed, Manson Passey, PA-C 12/30/2014, 11:25 AM Pager [phone]  History and all data above reviewed.  Patient examined.  I agree with the findings as above.  The patient has had increased swelling over a few  days.  He does not weight himself daily.  He takes most of his diuretic but not not take it if he is working or if he doesn't want to get  up at night to urinate.  He came today not because of dyspnea but because of increased swelling.  He also reports increasing erythema of his right leg.  He is status post recent lower extremity bypass as above.  He denies new PND or orthopnea.  He denies fevers or chills.  In the ED his CXR is markedly different compared to Nov.  He has left pleural effusion and consolidation vs atelectasis.  He denies chest pain.  No palpitations, presyncope or syncope. The patient exam reveals COR:RRR  ,  Lungs: Decreased breath sounds right base.   ,  Abd: Distended, Ext Right greater than left edema with erythema of the left leg and chronic venous stasis changes   .  All available labs, radiology testing, previous records reviewed. Agree with documented assessment and plan. Acute on chronic systolic and diastolic HF.  Needs to be admitted for IV diuresis. (I would suggest 80 mg IV Lasix BID.)   I will follow with repeat exam and CXR.  If his left lung exam and Xray does not improve he might need decubitus Xrays to make sure this is layering of the fluid and possible thoracentesis.  We will follow in consultation and defer management of his diabetes.  Also needs to be considered for antibiotics given the appearance of his leg.  Possible cellulitis.    Fayrene Fearing Sokhna Christoph  1:05 PM  12/30/2014

## 2014-12-30 NOTE — ED Provider Notes (Signed)
CSN: [id number]     Arrival date & time 12/30/14  8657 History   First MD Initiated Contact with Patient 12/30/14 [phone]     Chief Complaint  Patient presents with  . Shortness of Breath     (Consider location/radiation/quality/duration/timing/severity/associated sxs/prior Treatment) Patient is a 59 y.o. male presenting with shortness of breath. The history is provided by the patient.  Shortness of Breath Severity:  Moderate Onset quality:  Gradual Duration:  1 week Timing:  Constant Progression:  Worsening Chronicity:  New Context: activity   Relieved by:  Nothing Worsened by:  Nothing tried Ineffective treatments:  None tried Associated symptoms: no abdominal pain, no chest pain, no fever, no hemoptysis and no sputum production   Associated symptoms comment:  Leg swelling Risk factors comment:  CHF history   Past Medical History  Diagnosis Date  . Hyperlipidemia     mixed  . Chronic systolic heart failure (HCC)   . ICD (implantable cardiac defibrillator), dual, in situ     St. Jude for severe LVD EF 25% 2/07 explanted 2010. Medtronic Virtuoso II DR Dual-chamber cardiverter-defibrillator  with pocket revision, Dr. Graciela Husbands  . Tobacco abuse   . Ischemic cardiomyopathy     a. Echo (09/27/12): EF 20%, diffuse HK with periapical AK, no LV thrombus noted, restrictive physiology, trivial MR, mild LAE, RVSF mildly reduced, PASP 39  . Cerebrovascular disease   . Diabetes mellitus   . HTN (hypertension)   . PVD (peripheral vascular disease) (HCC)     a. ABI 09/2011 - R normal, L moderate - saw Dr. Kirke Corin - med rx.   . Carotid artery disease (HCC)     a. Dopp 09/2011: 60-79% RICA, 40-59% LICA.;  b.  Carotid US (7/14):  Bilateral 60-79% => f/u 6 mos  . Coronary artery disease     a. AWMI requiring IABP 2005 s/p Horizon study stent-LAD, staged BMS-Cx, DESx2-RCA. b. Last Last LHC (6/06):  EF 25%, pLAD stent 40-50, after stent 40, dLAD 20, pCFX 20, pOM1 70, pRCA 20, mRCA stents ok.=> med Rx;   c.  Myoview (7/14):  EF 26%, large ant, septal, inf and apical infarct, no ischemia.  Med Rx continued  . RIATA ICD Lead--on advisory recall    . Presence of permanent cardiac pacemaker   . AICD (automatic cardioverter/defibrillator) present   . Heart murmur   . History of kidney stones    Past Surgical History  Procedure Laterality Date  . Medtronic virtuoso ii dr dual-chamber cardioverter-defibrillation with pocket revison      Dr. Sherryl Manges  . Femoral-popliteal bypass graft Right 11/07/2013    Procedure: Right FEMORAL-POPLITEAL ARTERY Bypass ;  Surgeon: Chuck Hint, MD;  Location: [facility] OR;  Service: Vascular;  Laterality: Right;  . Intraoperative arteriogram Right 11/07/2013    Procedure: INTRA OPERATIVE ARTERIOGRAM;  Surgeon: Chuck Hint, MD;  Location: [facility] OR;  Service: Vascular;  Laterality: Right;  . Implantable cardioverter defibrillator (icd) generator change N/A 12/16/2012    Procedure: ICD GENERATOR CHANGE;  Surgeon: [name], MD;  Location: [facility] CATH LAB;  Service: Cardiovascular;  Laterality: N/A;  . Abdominal aortagram N/A 11/06/2013    Procedure: ABDOMINAL AORTAGRAM;  Surgeon: Chuck Hint, MD;  Location: [facility] CATH LAB;  Service: Cardiovascular;  Laterality: N/A;  . Lower extremity angiogram Bilateral 11/06/2013    Procedure: LOWER EXTREMITY ANGIOGRAM;  Surgeon: Chuck Hint, MD;  Location: [facility] CATH LAB;  Service: Cardiovascular;  Laterality: Bilateral;  . Peripheral vascular catheterization  N/A 09/25/2014    Procedure: Abdominal Aortogram;  Surgeon: Sherren Kernsharles E Fields, MD;  [facility];  Service: Cardiovascular;  Laterality: N/A;  . Appendectomy  2000    ruptured  . Femoral-popliteal bypass graft Left 11/17/2014    Procedure: BYPASS GRAFT FEMORAL-POPLITEAL ARTERY USING GORE PROPATEN 6MM X 80CM VASCULAR GRAFT;  Surgeon: Chuck Hinthristopher S Dickson, MD;  [facility];  Service: Vascular;  Laterality: Left;   Family History   Problem Relation Age of Onset  . Diabetes Mother   . Coronary artery disease Father    Social History  Substance Use Topics  . Smoking status: Current Some Day Smoker -- 1.00 packs/day for 33 years    Types: Cigarettes  . Smokeless tobacco: Never Used     Comment: smoker for 33 years  . Alcohol Use: No    Review of Systems  Constitutional: Negative for fever.  Respiratory: Positive for shortness of breath. Negative for hemoptysis and sputum production.   Cardiovascular: Negative for chest pain.  Gastrointestinal: Negative for abdominal pain.  All other systems reviewed and are negative.     Allergies  Niacin and Metformin and related  Home Medications   Prior to Admission medications   Medication Sig Start Date End Date Taking? Authorizing Provider  aspirin 81 MG tablet Take 1 tablet (81 mg total) by mouth daily. 10/07/12  Yes Duke SalviaSteven C Klein, MD  atorvastatin (LIPITOR) 80 MG tablet Take 1 tablet (80 mg total) by mouth every other day. Patient taking differently: Take 80 mg by mouth daily.  08/20/14  Yes Lewayne BuntingBrian S Crenshaw, MD  benazepril (LOTENSIN) 5 MG tablet Take 1 tablet (5 mg total) by mouth daily. 06/19/14  Yes Lewayne BuntingBrian S Crenshaw, MD  carvedilol (COREG) 6.25 MG tablet Take 1 tablet (6.25 mg total) by mouth 2 (two) times daily with a meal. Patient taking differently: Take 3.125 mg by mouth 2 (two) times daily with a meal.  11/12/13  Yes Richarda OverlieNayana Abrol, MD  ezetimibe (ZETIA) 10 MG tablet Take 1 tablet (10 mg total) by mouth daily. 01/08/14  Yes Duke SalviaSteven C Klein, MD  ibuprofen (ADVIL,MOTRIN) 200 MG tablet Take 200 mg by mouth every 6 (six) hours as needed for moderate pain.   Yes Historical Provider, MD  insulin NPH Human (HUMULIN N,NOVOLIN N) 100 UNIT/ML injection Inject 10 Units into the skin daily.    Yes Historical Provider, MD  insulin NPH-regular Human (NOVOLIN 70/30) (70-30) 100 UNIT/ML injection Inject 16-26 Units into the skin 2 (two) times daily with a meal. 26 units in the  morning, 16 units in the evening   Yes Historical Provider, MD  JARDIANCE 25 MG TABS tablet Take 25 mg by mouth daily. 03/02/14  Yes Historical Provider, MD  metFORMIN (GLUCOPHAGE) 500 MG tablet Take 500 mg by mouth daily with breakfast.   Yes Historical Provider, MD  spironolactone (ALDACTONE) 25 MG tablet TAKE ONE-HALF TABLET BY MOUTH ONCE DAILY 12/25/14  Yes Dolores Pattyaniel R Bensimhon, MD  torsemide (DEMADEX) 20 MG tablet Take 3 tablets (60 mg total) by mouth once. Patient taking differently: Take 60 mg by mouth daily.  08/20/14  Yes Lewayne BuntingBrian S Crenshaw, MD  cephALEXin (KEFLEX) 500 MG capsule Take 1 capsule (500 mg total) by mouth every 8 (eight) hours. Patient not taking: Reported on 12/30/2014 11/19/14   Samantha J Rhyne, PA-C  oxyCODONE (ROXICODONE) 5 MG immediate release tablet Take 1 tablet (5 mg total) by mouth every 6 (six) hours as needed. Patient not taking: Reported on  12/30/2014 11/19/14   Samantha J Rhyne, PA-C   BP 124/88 mmHg  Pulse 92  Temp(Src) 97.4 F (36.3 C) (Oral)  Resp 16  Wt 215 lb (97.523 kg)  SpO2 100% Physical Exam  Constitutional: He is oriented to person, place, and time. He appears well-developed and well-nourished. No distress.  HENT:  Head: Normocephalic and atraumatic.  Eyes: Conjunctivae are normal.  Neck: Neck supple. No tracheal deviation present.  Cardiovascular: Normal rate, regular rhythm and normal heart sounds.   Pulmonary/Chest: Effort normal. No respiratory distress. He has rales (bibasilar).  Abdominal: Soft. He exhibits no distension. There is no tenderness.  Musculoskeletal:  2+ b/l LE pitting edema  Neurological: He is alert and oriented to person, place, and time.  Skin: Skin is warm and dry.  Psychiatric: He has a normal mood and affect.    ED Course  Procedures (including critical care time)  Emergency Focused Ultrasound Exam Limited Ultrasound of Lower Extremity for DVT  Performed and interpreted by Dr. Clydene Pugh Indication: Leg  swelling Transverse views of bilateral lower extremities are obtained in real time for the purposes of evaluation for deep venous thrombosis.  Findings:  collapsible proximal femoral vein,  collapsible popliteal vein bilaterally Interpretation: no deep venous thrombosis Images archived electronically.  CPT Code:   93971   Labs Review Labs Reviewed  CBC WITH DIFFERENTIAL/PLATELET - Abnormal; Notable for the following:    Hemoglobin 12.4 (*)    MCH 25.6 (*)    RDW 17.3 (*)    All other components within normal limits  COMPREHENSIVE METABOLIC PANEL - Abnormal; Notable for the following:    Chloride 97 (*)    Glucose, Bld 301 (*)    BUN 22 (*)    Calcium 8.8 (*)    Albumin 2.9 (*)    Alkaline Phosphatase 130 (*)    All other components within normal limits  BRAIN NATRIURETIC PEPTIDE - Abnormal; Notable for the following:    B Natriuretic Peptide 862.8 (*)    All other components within normal limits  I-STAT TROPOININ, ED    Imaging Review Dg Chest 2 View  12/30/2014  CLINICAL DATA:  Leg swelling. EXAM: CHEST  2 VIEW COMPARISON:  11/19/2014. FINDINGS: Mediastinum and hilar structures normal. Cardiac pacer with lead tip over the right ventricle. Cardiomegaly with pulmonary vascular prominence and mild interstitial prominence consistent mild congestive heart failure. Right lower lobe dense atelectasis and or infiltrate. Spot right pleural effusion. No pneumothorax . IMPRESSION: 1. Cardiac pacer stable position. Cardiomegaly with pulmonary vascular prominence and bilateral interstitial prominence consistent congestive heart failure. Right pleural effusion noted. 2. Dense right lower lobe atelectasis and/or infiltrate . Electronically Signed   By: Maisie Fus  Register   On: 12/30/2014 08:44   I have personally reviewed and evaluated these images and lab results as part of my medical decision-making.   EKG Interpretation   Date/Time:  Wednesday December 30 2014 08:00:36 EST Ventricular  Rate:  105 PR Interval:  174 QRS Duration: 114 QT Interval:  366 QTC Calculation: 483 R Axis:   -175 Text Interpretation:  Sinus tachycardia Septal infarct , age undetermined  Lateral infarct , age undetermined Abnormal ECG No significant change  since last tracing Confirmed by Johnathin Vanderschaaf MD, Yasuko Lapage [phone]) on 12/30/2014  8:22:48 AM      MDM   Final diagnoses:  Acute on chronic systolic congestive heart failure (HCC)  Hyperglycemia without ketosis    59 y.o. male presents with ongoing shortness of breath and acute on chronic  CHF with increased interstitial edema and bilateral leg swelling. No acute thrombus noted. Multiple chronic open sores on legs with well healed surgical scars from vascular surgery. Cardiology consulted and saw the patient in consultation. Recommending medical admission for diuresis and tighter glucose control. Hospitalist was consulted for admission and will see the patient in the emergency department.     Lyndal Pulley, MD 12/30/14 2142

## 2014-12-31 DIAGNOSIS — Z794 Long term (current) use of insulin: Secondary | ICD-10-CM

## 2014-12-31 DIAGNOSIS — E1165 Type 2 diabetes mellitus with hyperglycemia: Secondary | ICD-10-CM

## 2014-12-31 DIAGNOSIS — L8990 Pressure ulcer of unspecified site, unspecified stage: Secondary | ICD-10-CM | POA: Insufficient documentation

## 2014-12-31 DIAGNOSIS — I5023 Acute on chronic systolic (congestive) heart failure: Secondary | ICD-10-CM

## 2014-12-31 DIAGNOSIS — J90 Pleural effusion, not elsewhere classified: Secondary | ICD-10-CM

## 2014-12-31 DIAGNOSIS — I739 Peripheral vascular disease, unspecified: Secondary | ICD-10-CM

## 2014-12-31 LAB — GLUCOSE, CAPILLARY
GLUCOSE-CAPILLARY: 205 mg/dL — AB (ref 65–99)
GLUCOSE-CAPILLARY: 246 mg/dL — AB (ref 65–99)
Glucose-Capillary: 186 mg/dL — ABNORMAL HIGH (ref 65–99)
Glucose-Capillary: 208 mg/dL — ABNORMAL HIGH (ref 65–99)

## 2014-12-31 LAB — BASIC METABOLIC PANEL
ANION GAP: 10 (ref 5–15)
BUN: 22 mg/dL — ABNORMAL HIGH (ref 6–20)
CO2: 28 mmol/L (ref 22–32)
Calcium: 8.4 mg/dL — ABNORMAL LOW (ref 8.9–10.3)
Chloride: 99 mmol/L — ABNORMAL LOW (ref 101–111)
Creatinine, Ser: 0.97 mg/dL (ref 0.61–1.24)
GFR calc Af Amer: >60 mL/min (ref >60)
GLUCOSE: 187 mg/dL — AB (ref 65–99)
POTASSIUM: 3 mmol/L — AB (ref 3.5–5.1)
SODIUM: 137 mmol/L (ref 135–145)

## 2014-12-31 MED ORDER — PIPERACILLIN-TAZOBACTAM 3.375 G IVPB
3.3750 g | Freq: Three times a day (TID) | INTRAVENOUS | Status: DC
  Administered 2014-12-31 – 2015-01-01 (×3): 3.375 g via INTRAVENOUS
  Filled 2014-12-31 (×5): qty 50

## 2014-12-31 MED ORDER — POTASSIUM CHLORIDE CRYS ER 20 MEQ PO TBCR
40.0000 meq | EXTENDED_RELEASE_TABLET | Freq: Two times a day (BID) | ORAL | Status: AC
  Administered 2014-12-31 (×2): 40 meq via ORAL
  Filled 2014-12-31 (×2): qty 2

## 2014-12-31 NOTE — Progress Notes (Signed)
ANTIBIOTIC CONSULT NOTE - INITIAL  Pharmacy Consult for zosyn  Indication: possible wound infection  Allergies  Allergen Reactions  . Niacin Itching and Other (See Comments)    Flushing   . Metformin And Related Nausea And Vomiting    *Only the extended release*    Patient Measurements: Height: 6' (182.9 cm) Weight: 222 lb 3.2 oz (100.789 kg) (scale a) IBW/kg (Calculated) : 77.6 Adjusted Body Weight:   Vital Signs: Temp: 98.6 F (37 C) (12/29 0738) Temp Source: Oral (12/29 0738) BP: 114/71 mmHg (12/29 0738) Pulse Rate: 83 (12/29 0738) Intake/Output from previous day: 12/28 0701 - 12/29 0700 In: 360 [P.O.:360] Out: 3975 [Urine:3975] Intake/Output from this shift: Total I/O In: 340 [P.O.:340] Out: 500 [Urine:500]  Labs:  Recent Labs  12/30/14 0829 12/31/14 0516  WBC 8.2  --   HGB 12.4*  --   PLT 165  --   CREATININE 1.04 0.97   Estimated Creatinine Clearance: 99.5 mL/min (by C-G formula based on Cr of 0.97). No results for input(s): VANCOTROUGH, VANCOPEAK, VANCORANDOM, GENTTROUGH, GENTPEAK, GENTRANDOM, TOBRATROUGH, TOBRAPEAK, TOBRARND, AMIKACINPEAK, AMIKACINTROU, AMIKACIN in the last 72 hours.   Microbiology: No results found for this or any previous visit (from the past 720 hour(s)).  Medical History: Past Medical History  Diagnosis Date  . Hyperlipidemia     mixed  . Chronic systolic heart failure (HCC)   . ICD (implantable cardiac defibrillator), dual, in situ     St. Jude for severe LVD EF 25% 2/07 explanted 2010. Medtronic Virtuoso II DR Dual-chamber cardiverter-defibrillator  with pocket revision, Dr. Graciela HusbandsKlein  . Tobacco abuse   . Ischemic cardiomyopathy     a. Echo (09/27/12): EF 20%, diffuse HK with periapical AK, no LV thrombus noted, restrictive physiology, trivial MR, mild LAE, RVSF mildly reduced, PASP 39  . Cerebrovascular disease   . Diabetes mellitus   . HTN (hypertension)   . PVD (peripheral vascular disease) (HCC)     a. ABI 09/2011 - R  normal, L moderate - saw Dr. Kirke CorinArida - med rx.   . Carotid artery disease (HCC)     a. Dopp 09/2011: 60-79% RICA, 40-59% LICA.;  b.  Carotid US (7/14):  Bilateral 60-79% => f/u 6 mos  . Coronary artery disease     a. AWMI requiring IABP 2005 s/p Horizon study stent-LAD, staged BMS-Cx, DESx2-RCA. b. Last Last LHC (6/06):  EF 25%, pLAD stent 40-50, after stent 40, dLAD 20, pCFX 20, pOM1 70, pRCA 20, mRCA stents ok.=> med Rx;  c.  Myoview (7/14):  EF 26%, large ant, septal, inf and apical infarct, no ischemia.  Med Rx continued  . RIATA ICD Lead--on advisory recall    . Presence of permanent cardiac pacemaker   . AICD (automatic cardioverter/defibrillator) present   . Heart murmur   . History of kidney stones     Medications:  Scheduled:  . aspirin  81 mg Oral Daily  . atorvastatin  80 mg Oral QPM  . benazepril  5 mg Oral Daily  . carvedilol  3.125 mg Oral BID WC  . enoxaparin (LOVENOX) injection  30 mg Subcutaneous Q24H  . ezetimibe  10 mg Oral Daily  . furosemide  80 mg Intravenous BID  . insulin aspart  0-15 Units Subcutaneous TID WC  . insulin aspart  0-5 Units Subcutaneous QHS  . piperacillin-tazobactam (ZOSYN)  IV  3.375 g Intravenous 3 times per day  . potassium chloride  40 mEq Oral BID  . sodium chloride  3  mL Intravenous Q12H   Assessment: Pharmacy consulted to dose zosyn for possible wound infection of recent popliteal artery bypass. Pt currently afebrile with WBC WNL. SCr stable. No cultures ordered.  Plan:  Zosyn 3.375g q8h  Monitor renal fcn, LOT, clinical status  Sherle Poe, PharmD Clinical Pharmacy Resident 12/31/2014,10:38 AM

## 2014-12-31 NOTE — Progress Notes (Signed)
Patient Name: Dalton RainwaterLarry D Tschantz Date of Encounter: 12/31/2014   SUBJECTIVE  Breathing stable. No chest pain.   CURRENT MEDS . aspirin  81 mg Oral Daily  . atorvastatin  80 mg Oral QPM  . benazepril  5 mg Oral Daily  . carvedilol  3.125 mg Oral BID WC  . enoxaparin (LOVENOX) injection  30 mg Subcutaneous Q24H  . ezetimibe  10 mg Oral Daily  . furosemide  80 mg Intravenous BID  . insulin aspart  0-15 Units Subcutaneous TID WC  . insulin aspart  0-5 Units Subcutaneous QHS  . potassium chloride  40 mEq Oral BID  . sodium chloride  3 mL Intravenous Q12H    OBJECTIVE  Filed Vitals:   12/30/14 1655 12/30/14 2154 12/31/14 0530 12/31/14 0738  BP: 111/76 100/61 123/82 114/71  Pulse: 82 80 83 83  Temp: 98.6 F (37 C) 98.7 F (37.1 C) 98.3 F (36.8 C) 98.6 F (37 C)  TempSrc: Oral Oral Oral Oral  Resp: 16 22 18 18   Height:      Weight:   222 lb 3.2 oz (100.789 kg)   SpO2: 99% 98% 98% 100%    Intake/Output Summary (Last 24 hours) at 12/31/14 1014 Last data filed at 12/31/14 0933  Gross per 24 hour  Intake    600 ml  Output   4475 ml  Net  -3875 ml   Filed Weights   12/30/14 0800 12/30/14 1501 12/31/14 0530  Weight: 215 lb (97.523 kg) 222 lb 12.8 oz (101.061 kg) 222 lb 3.2 oz (100.789 kg)    PHYSICAL EXAM  General: Pleasant, morbidly obese male in NAD, nasal cannula in place Neuro: Alert and oriented X 3. Moves all extremities spontaneously. Psych: Normal affect. HEENT:  Normal  Neck: Supple without bruit or JVD Lungs:  Resp regular and unlabored.  Diminished breath sound bilaterally. R base faint rales.  Heart: RRR no s3, s4 Systolic  murmurs. Abdomen: Soft, non-tender, mildly distended, BS + x 4.  Extremities: Chronic venous statin change. L>R edema on leg. Erythema on left leg.   Accessory Clinical Findings  CBC  Recent Labs  12/30/14 0829  WBC 8.2  NEUTROABS 6.4  HGB 12.4*  HCT 40.9  MCV 84.3  PLT 165   Basic Metabolic Panel  Recent Labs  12/30/14 0829 12/31/14 0516  NA 136 137  K 3.7 3.0*  CL 97* 99*  CO2 28 28  GLUCOSE 301* 187*  BUN 22* 22*  CREATININE 1.04 0.97  CALCIUM 8.8* 8.4*   Liver Function Tests  Recent Labs  12/30/14 0829  AST 23  ALT 19  ALKPHOS 130*  BILITOT 1.1  PROT 7.4  ALBUMIN 2.9*    TELE  Sinus rhythm  Radiology/Studies  Dg Chest 2 View  12/30/2014  CLINICAL DATA:  Leg swelling. EXAM: CHEST  2 VIEW COMPARISON:  11/19/2014. FINDINGS: Mediastinum and hilar structures normal. Cardiac pacer with lead tip over the right ventricle. Cardiomegaly with pulmonary vascular prominence and mild interstitial prominence consistent mild congestive heart failure. Right lower lobe dense atelectasis and or infiltrate. Spot right pleural effusion. No pneumothorax . IMPRESSION: 1. Cardiac pacer stable position. Cardiomegaly with pulmonary vascular prominence and bilateral interstitial prominence consistent congestive heart failure. Right pleural effusion noted. 2. Dense right lower lobe atelectasis and/or infiltrate . Electronically Signed   By: Maisie Fushomas  Register   On: 12/30/2014 08:44    ASSESSMENT AND PLAN  1. Acute on chronic systolic heart failure/ R pleural effusion - BNP  of 862.8. Chest x-ray with vascular congestion.  - Last echocardiogram 5/15 showed an ejection fraction of 20-25%, grade 2 diastolic dysfunction; moderate LAE, mild RAE, moderate MR. There was restrictive physiology.  - Started on IV lasix  BID. (held home torsemide and Aldactone). Diuresed 3.8L. Continue IV diuresis. Renal function and BP are stable. Decubitus chest xray per MD for evaluation of right pleural effusion.   2. Chronic venous insufficiency - Hx of right femoral pop 11/2013. - 11/19/2014 Left femoral to below knee popliteal artery bypass with 6 mm PTFE graftwith harvesting of left great saphenous vein. - Has non healing ulcers with erythema. Pending would consult. Per primary.  3. Ischemic cardiomyopathy - Prior  stenting of LAD/RCA and LCx with last Cath 2006 - Last myoview was in 07/2012 with large, severe, fixed anterior, septal, inferior and apical infarct; no ischemia, EF 26%  - No anginal pain   4. Implantable defibrillator St. Jude  -Device 12/31/14  interrogation showed normal device function. No VT/VF.   5. Carotid artery disease - Carotid US 09/01/2014: 60-79% RICA (stable) and 40-59%- LICA => f/u in 1 year  6. DM - Last A1c 9.6 11/17/2014 -- Per primary  Signed, Bhagat,Bhavinkumar PA-C   History and all data above reviewed.  Patient examined.  I agree with the findings as above.  Biggest complaint is left knee pain.  The patient exam reveals COR:RRR  ,  Lungs: Decreased breath sounds right greater than left base  ,  Abd: Positive bowel sounds, no rebound no guarding, Ext Decreased edema  .  All available labs, radiology testing, previous records reviewed. Agree with documented assessment and plan. Acute Systolic HF:  Continue IV diuresis.  I will plan to follow with decubitus CXR and thoracentesis if fluid is not improved.    Fayrene Fearing Garry Bochicchio  12:11 PM  12/31/2014

## 2014-12-31 NOTE — Progress Notes (Signed)
Inpatient Diabetes Program Recommendations  AACE/ADA: New Consensus Statement on Inpatient Glycemic Control (2015)  Target Ranges:  Prepandial:   less than 140 mg/dL      Peak postprandial:   less than 180 mg/dL (1-2 hours)      Critically ill patients:  140 - 180 mg/dL  Results for [name], [name] (MRN [id number]) as of 12/31/2014 10:38  Ref. Range 11/19/2014 06:11 11/19/2014 11:19 12/30/2014 17:27 12/30/2014 21:52 12/31/2014 05:50  Glucose-Capillary Latest Ref Range: 65-99 mg/dL 102179 (H) 725151 (H) 366218 (H) 231 (H) 205 (H)   Review of Glycemic Control  Diabetes history: DM2 Outpatient Diabetes medications: NPH 10 units daily, 70/30 26 units QAM with breakfast, 70/30 16 units QPM with supper, Jardiance 25 mg daily, Metformin 500 mg QAM Current orders for Inpatient glycemic control: Novolog 0-15 units TID with meals, Novolog 0-5 units HS  Inpatient Diabetes Program Recommendations: Insulin - Basal: Please consider ordering 70/30 10 units BID (with breakfast and supper).  Thanks, Orlando PennerMarie Payzlee Ryder, RN, MSN, CDE Diabetes Coordinator Inpatient Diabetes Program [phone] (Team Pager from 8am to 5pm) [phone] ([facility] office) [phone] [facility] office) [phone] [facility] office)

## 2014-12-31 NOTE — Progress Notes (Signed)
The patient refused the bed alarm.

## 2014-12-31 NOTE — Progress Notes (Signed)
PROGRESS NOTE  [name] YQM:[id number] DOB: [birth year] DOA: 12/30/2014 PCP: [name], MD  HPI: 59 y.o. male with a Past Medical History of HLD, CHF, ICD placement, smoker, DM, HTN, PVD, CAD, kidney stones who presents with acute on chronic CHF and chronic ongoing leg wounds  Subjective / 24 H Interval events - no chest pain, shortness of breath, no abdominal pain, nausea or vomiting.  - endorses LE swelling  Assessment/Plan: Principal Problem:   CHF, acute on chronic (HCC) Active Problems:   Hyperlipidemia   TOBACCO ABUSE   Peripheral vascular disease (HCC)   Hypertension   Automatic implantable cardioverter-defibrillator in [employer]   Type 2 diabetes mellitus with hyperglycemia (HCC)   Acute on chronic heart failure (HCC)   Pleural effusion   Acute on chronic systolic congestive heart failure (HCC)   Hyperglycemia without ketosis   Pressure ulcer   Acute on chronic systolic heart failure - Elevated BNP and C x-ray with vascular congestion. Volume overload on exam. Last echo 2015 revealed ejection fraction 20-25% grade 2 diastolic dysfunction.  - cardiology consulted, appreciate input. - started on Lasix 80 mg BID, continue. Net negative 3.6 L, weight appears the same  Lower extremity edema.  - Patient with a history of peripheral vascular disease. Status post recent left femoral the knee popliteal artery bypass.   LLE cellulitis - patient with chronic changes, on left I appreciate an area of cellulitis, start Zosyn  Diabetes - Home medications include 7030 metformin. - Hold metformin for now - Obtain a hemoglobin A1c - Use sliding scale insulin for optimal control  HTN - good control, continue current regimen   Peripheral vascular disease. See #2. Recent left femoral below knee popliteal artery bypass. - Appears stable  Implantable defibrillator [employer]  Plerual effusion.  - Per chest x-ray - diruesis as noted above - Repeat chest  x-ray   Diet: Diet renal/carb modified with fluid restriction Diet-HS Snack?: Nothing; Room service appropriate?: Yes; Fluid consistency:: Thin Fluids: none  DVT Prophylaxis: Lovenox  Code Status: Full Code Family Communication: no family bedside  Disposition Plan: home when ready   Barriers to discharge: IV diuresis   Consultants:  Cardiology   Procedures:  None    Antibiotics  Anti-infectives    Start     Dose/Rate Route Frequency Ordered Stop   12/31/14 1400  piperacillin-tazobactam (ZOSYN) IVPB 3.375 g     3.375 g 12.5 mL/hr over 240 Minutes Intravenous 3 times per day 12/31/14 1055         Studies  Dg Chest 2 View  12/30/2014  CLINICAL DATA:  Leg swelling. EXAM: CHEST  2 VIEW COMPARISON:  11/19/2014. FINDINGS: Mediastinum and hilar structures normal. Cardiac pacer with lead tip over the right ventricle. Cardiomegaly with pulmonary vascular prominence and mild interstitial prominence consistent mild congestive heart failure. Right lower lobe dense atelectasis and or infiltrate. Spot right pleural effusion. No pneumothorax . IMPRESSION: 1. Cardiac pacer stable position. Cardiomegaly with pulmonary vascular prominence and bilateral interstitial prominence consistent congestive heart failure. Right pleural effusion noted. 2. Dense right lower lobe atelectasis and/or infiltrate . Electronically Signed   By: Maisie Fus  Register   On: 12/30/2014 08:44    Objective  Filed Vitals:   12/30/14 1655 12/30/14 2154 12/31/14 0530 12/31/14 0738  BP: 111/76 100/61 123/82 114/71  Pulse: 82 80 83 83  Temp: 98.6 F (37 C) 98.7 F (37.1 C) 98.3 F (36.8 C) 98.6 F (37 C)  TempSrc: Oral Oral Oral  Oral  Resp: 16 22 18 18   Height:      Weight:   100.789 kg (222 lb 3.2 oz)   SpO2: 99% 98% 98% 100%    Intake/Output Summary (Last 24 hours) at 12/31/14 1408 Last data filed at 12/31/14 1305  Gross per 24 hour  Intake    700 ml  Output   3400 ml  Net  -2700 ml   Filed Weights    12/30/14 0800 12/30/14 1501 12/31/14 0530  Weight: 97.523 kg (215 lb) 101.061 kg (222 lb 12.8 oz) 100.789 kg (222 lb 3.2 oz)    Exam:  GENERAL: NAD  HEENT: no scleral icterus, PERRL  NECK: supple, no LAD  LUNGS: CTA biL, no wheezing  HEART: RRR without MRG; 2+ LE edema  ABDOMEN: soft, non tender  EXTREMITIES: no clubbing / cyanosis. 2_ LE edema, cellulitic area on LLE just below knee  NEUROLOGIC: non focal   Data Reviewed: Basic Metabolic Panel:  Recent Labs Lab 12/30/14 0829 12/31/14 0516  NA 136 137  K 3.7 3.0*  CL 97* 99*  CO2 28 28  GLUCOSE 301* 187*  BUN 22* 22*  CREATININE 1.04 0.97  CALCIUM 8.8* 8.4*   Liver Function Tests:  Recent Labs Lab 12/30/14 0829  AST 23  ALT 19  ALKPHOS 130*  BILITOT 1.1  PROT 7.4  ALBUMIN 2.9*   CBC:  Recent Labs Lab 12/30/14 0829  WBC 8.2  NEUTROABS 6.4  HGB 12.4*  HCT 40.9  MCV 84.3  PLT 165   BNP (last 3 results)  Recent Labs  12/30/14 0830  BNP 862.8*   CBG:  Recent Labs Lab 12/30/14 1727 12/30/14 2152 12/31/14 0550 12/31/14 1122  GLUCAP 218* 231* 205* 186*   Scheduled Meds: . aspirin  81 mg Oral Daily  . atorvastatin  80 mg Oral QPM  . benazepril  5 mg Oral Daily  . carvedilol  3.125 mg Oral BID WC  . enoxaparin (LOVENOX) injection  30 mg Subcutaneous Q24H  . ezetimibe  10 mg Oral Daily  . furosemide  80 mg Intravenous BID  . insulin aspart  0-15 Units Subcutaneous TID WC  . insulin aspart  0-5 Units Subcutaneous QHS  . piperacillin-tazobactam (ZOSYN)  IV  3.375 g Intravenous 3 times per day  . potassium chloride  40 mEq Oral BID  . sodium chloride  3 mL Intravenous Q12H   Continuous Infusions:    Pamella Pertostin Krina Mraz, MD [facility] Pager [phone]. If 7 PM - 7 AM, please contact night-coverage at [email or web address], password [email or web address] 12/31/2014, 2:08 PM  LOS: 1 day

## 2014-12-31 NOTE — Progress Notes (Signed)
Utilization review completed. Dallan Schonberg, RN, BSN. 

## 2015-01-01 ENCOUNTER — Inpatient Hospital Stay (HOSPITAL_COMMUNITY): Payer: BLUE CROSS/BLUE SHIELD

## 2015-01-01 DIAGNOSIS — I10 Essential (primary) hypertension: Secondary | ICD-10-CM

## 2015-01-01 DIAGNOSIS — Z95810 Presence of automatic (implantable) cardiac defibrillator: Secondary | ICD-10-CM

## 2015-01-01 LAB — BASIC METABOLIC PANEL
Anion gap: 10 (ref 5–15)
BUN: 20 mg/dL (ref 6–20)
CALCIUM: 8.2 mg/dL — AB (ref 8.9–10.3)
CHLORIDE: 96 mmol/L — AB (ref 101–111)
CO2: 28 mmol/L (ref 22–32)
CREATININE: 1.02 mg/dL (ref 0.61–1.24)
Glucose, Bld: 195 mg/dL — ABNORMAL HIGH (ref 65–99)
Potassium: 3.1 mmol/L — ABNORMAL LOW (ref 3.5–5.1)
Sodium: 134 mmol/L — ABNORMAL LOW (ref 135–145)

## 2015-01-01 LAB — GLUCOSE, CAPILLARY
GLUCOSE-CAPILLARY: 285 mg/dL — AB (ref 65–99)
Glucose-Capillary: 191 mg/dL — ABNORMAL HIGH (ref 65–99)
Glucose-Capillary: 225 mg/dL — ABNORMAL HIGH (ref 65–99)
Glucose-Capillary: 213 mg/dL — ABNORMAL HIGH (ref 65–99)

## 2015-01-01 MED ORDER — FUROSEMIDE 10 MG/ML IJ SOLN
80.0000 mg | Freq: Three times a day (TID) | INTRAMUSCULAR | Status: DC
  Administered 2015-01-01 – 2015-01-05 (×12): 80 mg via INTRAVENOUS
  Filled 2015-01-01 (×12): qty 8

## 2015-01-01 MED ORDER — POTASSIUM CHLORIDE CRYS ER 20 MEQ PO TBCR
40.0000 meq | EXTENDED_RELEASE_TABLET | Freq: Three times a day (TID) | ORAL | Status: AC
  Administered 2015-01-01 (×3): 40 meq via ORAL
  Filled 2015-01-01 (×3): qty 2

## 2015-01-01 MED ORDER — DOXYCYCLINE HYCLATE 100 MG PO TABS
100.0000 mg | ORAL_TABLET | Freq: Two times a day (BID) | ORAL | Status: DC
  Administered 2015-01-01 – 2015-01-05 (×9): 100 mg via ORAL
  Filled 2015-01-01 (×9): qty 1

## 2015-01-01 MED ORDER — POTASSIUM CHLORIDE CRYS ER 20 MEQ PO TBCR
80.0000 meq | EXTENDED_RELEASE_TABLET | Freq: Two times a day (BID) | ORAL | Status: DC
  Administered 2015-01-01 (×2): 80 meq via ORAL
  Filled 2015-01-01 (×2): qty 4

## 2015-01-01 MED ORDER — POTASSIUM CHLORIDE CRYS ER 20 MEQ PO TBCR: 40.0000 meq | EXTENDED_RELEASE_TABLET | Freq: Three times a day (TID) | ORAL | Status: DC

## 2015-01-01 NOTE — Progress Notes (Signed)
Inpatient Diabetes Program Recommendations  AACE/ADA: New Consensus Statement on Inpatient Glycemic Control (2015)  Target Ranges:  Prepandial:   less than 140 mg/dL      Peak postprandial:   less than 180 mg/dL (1-2 hours)      Critically ill patients:  140 - 180 mg/dL  Results for Dalton RainwaterFULP, Trentyn D (MRN [id number]) as of 01/01/2015 09:08  Ref. Range 12/31/2014 05:50 12/31/2014 11:22 12/31/2014 16:16 12/31/2014 20:24 01/01/2015 05:31  Glucose-Capillary Latest Ref Range: 65-99 mg/dL 409205 (H) 811186 (H) 914246 (H) 208 (H) 191 (H)   Review of Glycemic Control  Diabetes history: DM2 Outpatient Diabetes medications: NPH 10 units daily, 70/30 26 units QAM with breakfast, 70/30 16 units QPM with supper, Jardiance 25 mg daily, Metformin 500 mg QAM Current orders for Inpatient glycemic control: Novolog 0-15 units TID with meals, Novolog 0-5 units HS  Inpatient Diabetes Program Recommendations: Insulin - Basal: Please consider ordering 70/30 10 units BID (with breakfast and supper).  Thanks, Orlando PennerMarie Aracelly Tencza, RN, MSN, CDE Diabetes Coordinator Inpatient Diabetes Program [phone] (Team Pager from 8am to 5pm) [phone] ([facility] office) [phone] [facility] office) [phone] [facility] office)

## 2015-01-01 NOTE — Progress Notes (Signed)
PROGRESS NOTE  Dalton RainwaterLarry D [name] ZOX:[id number] DOB: [birth year] DOA: 12/30/2014 PCP: Neldon LabellaMILLER,[name], MD  HPI: 59 y.o. male with a Past Medical History of HLD, CHF, ICD placement, smoker, DM, HTN, PVD, CAD, kidney stones who presents with acute on chronic CHF and chronic ongoing leg wounds  Subjective / 24 H Interval events - Patient continues to complain of shortness of breath regardless of whether he lays flat or stands up, ongoing lower extremity swelling  Assessment/Plan: Principal Problem:   CHF, acute on chronic (HCC) Active Problems:   Hyperlipidemia   TOBACCO ABUSE   Peripheral vascular disease (HCC)   Hypertension   Automatic implantable cardioverter-defibrillator in [employer]   Type 2 diabetes mellitus with hyperglycemia (HCC)   Acute on chronic heart failure (HCC)   Pleural effusion   Acute on chronic systolic congestive heart failure (HCC)   Hyperglycemia without ketosis   Pressure ulcer   Acute on chronic systolic heart failure - Elevated BNP and C x-ray with vascular congestion. Volume overload on exam. Last echo 2015 revealed ejection fraction 20-25% grade 2 diastolic dysfunction.  - cardiology consulted, appreciate input. - started on IV Lasix 80, continue, increase the dose today. Net negative 4.1 L, weight appears like he is up 2 pounds from yesterday  Lower extremity edema.  - Patient with a history of peripheral vascular disease. Status post recent left femoral the knee popliteal artery bypass.   LLE cellulitis - patient with chronic changes, on left I appreciate an area of cellulitis, which is improving - starts to improve on IV antibiotics, changed to doxycycline to have MRSA coverage as well as have coverage for potential lung infiltrate  Diabetes - Home medications include 7030 metformin. - Hold metformin for now - Most recent hemoglobin A1c was a month ago and showed poor control at 9.6 - Use sliding scale insulin for optimal control - CBGs in the  hospital ranged 190-225 over the past day, continue current regimen  HTN - good control, continue current regimen  - Most recent blood pressure 102/69 this morning  Peripheral vascular disease. See #2. Recent left femoral below knee popliteal artery bypass. - Appears stable  Implantable defibrillator [employer]  Plerual effusion.  - diruesis as noted above - Repeat a lateral decubitus chest x-ray   Diet: Diet renal/carb modified with fluid restriction Diet-HS Snack?: Nothing; Room service appropriate?: Yes; Fluid consistency:: Thin Fluids: none  DVT Prophylaxis: Lovenox  Code Status: Full Code Family Communication: Discussed with patient's mother bedside Disposition Plan: home when ready   Barriers to discharge: IV diuresis   Consultants:  Cardiology   Procedures:  None    Antibiotics  Anti-infectives    Start     Dose/Rate Route Frequency Ordered Stop   01/01/15 1300  doxycycline (VIBRA-TABS) tablet 100 mg     100 mg Oral Every 12 hours 01/01/15 1247     12/31/14 1400  piperacillin-tazobactam (ZOSYN) IVPB 3.375 g  Status:  Discontinued     3.375 g 12.5 mL/hr over 240 Minutes Intravenous 3 times per day 12/31/14 1055 01/01/15 1247       Studies  Dg Chest Right Decubitus  01/01/2015  CLINICAL DATA:  Right-sided effusion. EXAM: CHEST - RIGHT DECUBITUS COMPARISON:  12/30/2014 FINDINGS: Patient's position right-side-down, showing a component of layering effusion on the right. There is also right basilar atelectasis and/or infiltrate. Left lung is essentially clear. Heart is enlarged. IMPRESSION: 1. Small right layering pleural effusion. 2. Significant right lower lobe atelectasis or infiltrate.  Electronically Signed   By: Norva Pavlov M.D.   On: 01/01/2015 12:34    Objective  Filed Vitals:   12/31/14 1635 12/31/14 2005 12/31/14 2030 01/01/15 0458  BP: 108/70 101/63  102/69  Pulse: 79 78  78  Temp:  98.6 F (37 C)  97.9 F (36.6 C)  TempSrc:  Oral  Oral    Resp: Height:      Weight:    101.651 kg (224 lb 1.6 oz)  SpO2: 99% 100% 91% 98%    Intake/Output Summary (Last 24 hours) at 01/01/15 1244 Last data filed at 01/01/15 1143  Gross per 24 hour  Intake   1350 ml  Output   1125 ml  Net    225 ml   Filed Weights   12/30/14 1501 12/31/14 0530 01/01/15 0458  Weight: 101.061 kg (222 lb 12.8 oz) 100.789 kg (222 lb 3.2 oz) 101.651 kg (224 lb 1.6 oz)    Exam:  GENERAL: NAD  HEENT: no scleral icterus, PERRL  NECK: supple, no LAD  LUNGS: CTA biL, no wheezing  HEART: RRR without MRG; 2+ LE edema  ABDOMEN: soft, non tender  EXTREMITIES: no clubbing / cyanosis. 2_ LE edema, cellulitic area on LLE just below knee, improving  NEUROLOGIC: non focal   Data Reviewed: Basic Metabolic Panel:  Recent Labs Lab 12/30/14 0829 12/31/14 0516 01/01/15 0555  NA 136 137 134*  K 3.7 3.0* 3.1*  CL 97* 99* 96*  CO2 GLUCOSE 301* 187* 195*  BUN 22* 22* 20  CREATININE 1.04 0.97 1.02  CALCIUM 8.8* 8.4* 8.2*   Liver Function Tests:  Recent Labs Lab 12/30/14 0829  AST 23  ALT 19  ALKPHOS 130*  BILITOT 1.1  PROT 7.4  ALBUMIN 2.9*   CBC:  Recent Labs Lab 12/30/14 0829  WBC 8.2  NEUTROABS 6.4  HGB 12.4*  HCT 40.9  MCV 84.3  PLT 165   BNP (last 3 results)  Recent Labs  12/30/14 0830  BNP 862.8*   CBG:  Recent Labs Lab 12/31/14 1122 12/31/14 1616 12/31/14 2024 01/01/15 0531 01/01/15 1116  GLUCAP 186* 246* 208* 191* 225*   Scheduled Meds: . aspirin  81 mg Oral Daily  . atorvastatin  80 mg Oral QPM  . benazepril  5 mg Oral Daily  . carvedilol  3.125 mg Oral BID WC  . enoxaparin (LOVENOX) injection  30 mg Subcutaneous Q24H  . ezetimibe  10 mg Oral Daily  . furosemide  80 mg Intravenous TID  . insulin aspart  0-15 Units Subcutaneous TID WC  . insulin aspart  0-5 Units Subcutaneous QHS  . piperacillin-tazobactam (ZOSYN)  IV  3.375 g Intravenous 3 times per day  . potassium chloride  40  mEq Oral TID  . potassium chloride  80 mEq Oral BID  . sodium chloride  3 mL Intravenous Q12H   Continuous Infusions:    Pamella Pert, MD [facility] Pager [phone]. If 7 PM - 7 AM, please contact night-coverage at [email or web address], password [facility] 01/01/2015, 12:43 PM  LOS: 2 days

## 2015-01-01 NOTE — Progress Notes (Signed)
Patient Name: [name] Date of Encounter: 01/01/2015   SUBJECTIVE  Breathing stable however feels chest tightness when off supplemental oxygen.   CURRENT MEDS . aspirin  81 mg Oral Daily  . atorvastatin  80 mg Oral QPM  . benazepril  5 mg Oral Daily  . carvedilol  3.125 mg Oral BID WC  . enoxaparin (LOVENOX) injection  30 mg Subcutaneous Q24H  . ezetimibe  10 mg Oral Daily  . furosemide  80 mg Intravenous BID  . insulin aspart  0-15 Units Subcutaneous TID WC  . insulin aspart  0-5 Units Subcutaneous QHS  . piperacillin-tazobactam (ZOSYN)  IV  3.375 g Intravenous 3 times per day  . sodium chloride  3 mL Intravenous Q12H    OBJECTIVE  Filed Vitals:   12/31/14 1635 12/31/14 2005 12/31/14 2030 01/01/15 0458  BP: 108/70 101/63  102/69  Pulse: 79 78  78  Temp:  98.6 F (37 C)  97.9 F (36.6 C)  TempSrc:  Oral  Oral  Resp: Height:      Weight:    224 lb 1.6 oz (101.651 kg)  SpO2: 99% 100% 91% 98%    Intake/Output Summary (Last 24 hours) at 01/01/15 1610 Last data filed at 01/01/15 0910  Gross per 24 hour  Intake   1570 ml  Output   1350 ml  Net    220 ml   Filed Weights   12/30/14 1501 12/31/14 0530 01/01/15 0458  Weight: 222 lb 12.8 oz (101.061 kg) 222 lb 3.2 oz (100.789 kg) 224 lb 1.6 oz (101.651 kg)    PHYSICAL EXAM  General: Pleasant, morbidly obese male in NAD, nasal cannula in place and sitting in chair Neuro: Alert and oriented X 3. Moves all extremities spontaneously. Psych: Normal affect. HEENT:  Normal  Neck: Supple without bruit or JVD Lungs:  Resp regular and unlabored.  Diminished breath sound  R base. Heart: RRR no s3, s4 Systolic  murmurs. Abdomen: Soft, non-tender, mildly distended, BS + x 4.  Extremities: Chronic venous statin change. L>R edema on leg. Erythema on left leg.   Accessory Clinical Findings  CBC  Recent Labs  12/30/14 0829  WBC 8.2  NEUTROABS 6.4  HGB 12.4*  HCT 40.9  MCV 84.3  PLT 165   Basic  Metabolic Panel  Recent Labs  12/31/14 0516 01/01/15 0555  NA 137 134*  K 3.0* 3.1*  CL 99* 96*  CO2 28 28  GLUCOSE 187* 195*  BUN 22* 20  CREATININE 0.97 1.02  CALCIUM 8.4* 8.2*   Liver Function Tests  Recent Labs  12/30/14 0829  AST 23  ALT 19  ALKPHOS 130*  BILITOT 1.1  PROT 7.4  ALBUMIN 2.9*    TELE  Sinus rhythm  Radiology/Studies  Dg Chest 2 View  12/30/2014  CLINICAL DATA:  Leg swelling. EXAM: CHEST  2 VIEW COMPARISON:  11/19/2014. FINDINGS: Mediastinum and hilar structures normal. Cardiac pacer with lead tip over the right ventricle. Cardiomegaly with pulmonary vascular prominence and mild interstitial prominence consistent mild congestive heart failure. Right lower lobe dense atelectasis and or infiltrate. Spot right pleural effusion. No pneumothorax . IMPRESSION: 1. Cardiac pacer stable position. Cardiomegaly with pulmonary vascular prominence and bilateral interstitial prominence consistent congestive heart failure. Right pleural effusion noted. 2. Dense right lower lobe atelectasis and/or infiltrate . Electronically Signed   By: Maisie Fus  Register   On: 12/30/2014 08:44    ASSESSMENT AND PLAN  1. Acute on  chronic systolic heart failure/ R pleural effusion - BNP of 862.8. Chest x-ray with vascular congestion.  - Last echocardiogram 5/15 showed an ejection fraction of 20-25%, grade 2 diastolic dysfunction; moderate LAE, mild RAE, moderate MR. There was restrictive physiology.  - Started on IV lasix 80mg  BID. (held home torsemide and Aldactone). Diuresed 0.5L/3.89L. Not collected all urine output yesterday.  Continue IV diuresis. Renal function and BP are stable. Decubitus chest xray per MD for evaluation of right pleural effusion.  - Continue BB and ACE.  2. Chronic venous insufficiency with cellulitis. - Hx of right femoral pop 11/2013. - 11/19/2014 Left femoral to below knee popliteal artery bypass with 6 mm PTFE graftwith harvesting of left great  saphenous vein. - started on abx  3. Ischemic cardiomyopathy - Prior stenting of LAD/RCA and LCx with last Cath 2006 - Last myoview was in 07/2012 with large, severe, fixed anterior, septal, inferior and apical infarct; no ischemia, EF 26%  - No anginal pain  4. Implantable defibrillator [employer]  -Device 12/31/14  interrogation showed normal device function. No VT/VF.   5. Carotid artery disease - Carotid US 09/01/2014: 60-79% RICA (stable) and 40-59%- LICA => f/u in 1 year  6. DM - Last A1c 9.6 11/17/2014 -- Per primary  7. Hypokalemia - Will give suppliement  Signed, Bhagat,Bhavinkumar PA-C  History and all data above reviewed.  Patient examined.  I agree with the findings as above.   Not good UO yesterday.  He has continued SOB.   The patient exam reveals COR:RRR  ,  Lungs: Decreased breath sounds on the right  ,  Abd: Distended, Ext Severe edema  .  All available labs, radiology testing, previous records reviewed. Agree with documented assessment and plan. CHF:  I will increase his diuretic to TID.  Check decubitus films and plan thoracentesis if fluid layers.  Hypokalemia:  He will get 120 meq of KCL today.  Follow BMETs.    Fayrene FearingJames Analissa Bayless  10:07 AM  01/01/2015

## 2015-01-01 NOTE — Progress Notes (Signed)
The patient refused the bed alarm.   

## 2015-01-02 ENCOUNTER — Encounter (HOSPITAL_COMMUNITY): Payer: Self-pay | Admitting: *Deleted

## 2015-01-02 ENCOUNTER — Inpatient Hospital Stay (HOSPITAL_COMMUNITY): Payer: BLUE CROSS/BLUE SHIELD

## 2015-01-02 DIAGNOSIS — L03116 Cellulitis of left lower limb: Secondary | ICD-10-CM | POA: Insufficient documentation

## 2015-01-02 LAB — BASIC METABOLIC PANEL
Anion gap: 10 (ref 5–15)
BUN: 26 mg/dL — ABNORMAL HIGH (ref 6–20)
CHLORIDE: 97 mmol/L — AB (ref 101–111)
CO2: 28 mmol/L (ref 22–32)
Calcium: 8.6 mg/dL — ABNORMAL LOW (ref 8.9–10.3)
Creatinine, Ser: 0.96 mg/dL (ref 0.61–1.24)
GFR calc non Af Amer: >60 mL/min (ref >60)
Glucose, Bld: 221 mg/dL — ABNORMAL HIGH (ref 65–99)
POTASSIUM: 5.9 mmol/L — AB (ref 3.5–5.1)
SODIUM: 135 mmol/L (ref 135–145)

## 2015-01-02 LAB — GLUCOSE, CAPILLARY
GLUCOSE-CAPILLARY: 195 mg/dL — AB (ref 65–99)
GLUCOSE-CAPILLARY: 242 mg/dL — AB (ref 65–99)
GLUCOSE-CAPILLARY: 207 mg/dL — AB (ref 65–99)
Glucose-Capillary: 199 mg/dL — ABNORMAL HIGH (ref 65–99)

## 2015-01-02 LAB — POTASSIUM: POTASSIUM: 4.4 mmol/L (ref 3.5–5.1)

## 2015-01-02 MED ORDER — IOHEXOL 300 MG/ML  SOLN
75.0000 mL | Freq: Once | INTRAMUSCULAR | Status: AC | PRN
  Administered 2015-01-02: 75 mL via INTRAVENOUS

## 2015-01-02 MED ORDER — METOLAZONE 5 MG PO TABS
5.0000 mg | ORAL_TABLET | Freq: Every day | ORAL | Status: DC
  Administered 2015-01-02 – 2015-01-03 (×2): 5 mg via ORAL
  Filled 2015-01-02 (×2): qty 1

## 2015-01-02 MED ORDER — SODIUM POLYSTYRENE SULFONATE 15 GM/60ML PO SUSP: 15.0000 g | Freq: Once | ORAL | Status: DC

## 2015-01-02 NOTE — Progress Notes (Signed)
PROGRESS NOTE  [name] ZOX:[id number] DOB: 09-03-1955 DOA: 12/30/2014 PCP: [name], MD  HPI: 59 y.o. male with a Past Medical History of HLD, CHF, ICD placement, smoker, DM, HTN, PVD, CAD, kidney stones who presents with acute on chronic CHF and chronic ongoing leg wounds  Subjective / 24 H Interval events - Doesn't feel that improved today   Assessment/Plan: Principal Problem:   CHF, acute on chronic (HCC) Active Problems:   Hyperlipidemia   TOBACCO ABUSE   Peripheral vascular disease (HCC)   Hypertension   Automatic implantable cardioverter-defibrillator in [employer]   Type 2 diabetes mellitus with hyperglycemia (HCC)   Acute on chronic heart failure (HCC)   Pleural effusion   Acute on chronic systolic congestive heart failure (HCC)   Hyperglycemia without ketosis   Pressure ulcer   Acute on chronic systolic heart failure - Elevated BNP and C x-ray with vascular congestion. Volume overload on exam. Last echo 2015 revealed ejection fraction 20-25% grade 2 diastolic dysfunction.  - cardiology consulted, appreciate input. - Continue IV diuresis, he is net -3.8 L however weight seems up from 224 to 227 pounds ? Accurate weights  Lower extremity edema.  - Patient with a history of peripheral vascular disease. Status post recent left femoral the knee popliteal artery bypass.   LLE cellulitis - patient with chronic changes, on left I appreciate an area of cellulitis, which is improving - continue doxycycline   Diabetes - Home medications include 7030 metformin. - Hold metformin for now - Most recent hemoglobin A1c was a month ago and showed poor control at 9.6 - Use sliding scale insulin for optimal control - CBGs in the hospital ranged 195-242 over the past day, continue current regimen  HTN - good control, no changes - Most recent blood pressure 118/76 this morning  Peripheral vascular disease. See #2. Recent left femoral below knee popliteal artery  bypass. - Appears stable  Implantable defibrillator [employer]  Plerual effusion and right infiltrate - diruesis as noted above - CT chest today to further evaluate  Diet: Diet renal/carb modified with fluid restriction Diet-HS Snack?: Nothing; Room service appropriate?: Yes; Fluid consistency:: Thin Fluids: none  DVT Prophylaxis: Lovenox  Code Status: Full Code Family Communication:  no family bedside  Disposition Plan: home when ready   Barriers to discharge: IV diuresis   Consultants:  Cardiology   Procedures:  None    Antibiotics  Anti-infectives    Start     Dose/Rate Route Frequency Ordered Stop   01/01/15 1300  doxycycline (VIBRA-TABS) tablet 100 mg     100 mg Oral Every 12 hours 01/01/15 1247     12/31/14 1400  piperacillin-tazobactam (ZOSYN) IVPB 3.375 g  Status:  Discontinued     3.375 g 12.5 mL/hr over 240 Minutes Intravenous 3 times per day 12/31/14 1055 01/01/15 1247       Studies  Dg Chest Right Decubitus  01/01/2015  CLINICAL DATA:  Right-sided effusion. EXAM: CHEST - RIGHT DECUBITUS COMPARISON:  12/30/2014 FINDINGS: Patient's position right-side-down, showing a component of layering effusion on the right. There is also right basilar atelectasis and/or infiltrate. Left lung is essentially clear. Heart is enlarged. IMPRESSION: 1. Small right layering pleural effusion. 2. Significant right lower lobe atelectasis or infiltrate. Electronically Signed   By: Norva Pavlov M.D.   On: 01/01/2015 12:34    Objective  Filed Vitals:   01/01/15 1733 01/01/15 1955 01/02/15 0537 01/02/15 0955  BP: 100/69 104/76 121/79 118/76  Pulse:  87 84 79 87  Temp:  98.3 F (36.8 C) 97.6 F (36.4 C)   TempSrc:  Oral Oral   Resp:  18 18   Height:      Weight:   103.057 kg (227 lb 3.2 oz)   SpO2:  100% 94%     Intake/Output Summary (Last 24 hours) at 01/02/15 1226 Last data filed at 01/02/15 0913  Gross per 24 hour  Intake   1280 ml  Output   1025 ml  Net    255  ml   Filed Weights   12/31/14 0530 01/01/15 0458 01/02/15 0537  Weight: 100.789 kg (222 lb 3.2 oz) 101.651 kg (224 lb 1.6 oz) 103.057 kg (227 lb 3.2 oz)    Exam:  GENERAL: NAD  HEENT: no scleral icterus, PERRL  NECK: supple, no LAD  LUNGS: CTA biL, no wheezing  HEART: RRR without MRG; 2+ LE edema  ABDOMEN: soft, non tender  EXTREMITIES: no clubbing / cyanosis. 2_ LE edema, cellulitic area on LLE just below knee, improving  NEUROLOGIC: non focal   Data Reviewed: Basic Metabolic Panel:  Recent Labs Lab 12/30/14 0829 12/31/14 0516 01/01/15 0555 01/02/15 0319  NA 136 137 134* 135  K 3.7 3.0* 3.1* 5.9*  CL 97* 99* 96* 97*  CO2 28 28 28 28   GLUCOSE 301* 187* 195* 221*  BUN 22* 22* 20 26*  CREATININE 1.04 0.97 1.02 0.96  CALCIUM 8.8* 8.4* 8.2* 8.6*   Liver Function Tests:  Recent Labs Lab 12/30/14 0829  AST 23  ALT 19  ALKPHOS 130*  BILITOT 1.1  PROT 7.4  ALBUMIN 2.9*   CBC:  Recent Labs Lab 12/30/14 0829  WBC 8.2  NEUTROABS 6.4  HGB 12.4*  HCT 40.9  MCV 84.3  PLT 165   BNP (last 3 results)  Recent Labs  12/30/14 0830  BNP 862.8*   CBG:  Recent Labs Lab 01/01/15 1116 01/01/15 1637 01/01/15 2039 01/02/15 0607 01/02/15 1132  GLUCAP 225* 285* 213* 195* 242*   Scheduled Meds: . aspirin  81 mg Oral Daily  . atorvastatin  80 mg Oral QPM  . benazepril  5 mg Oral Daily  . carvedilol  3.125 mg Oral BID WC  . doxycycline  100 mg Oral Q12H  . enoxaparin (LOVENOX) injection  30 mg Subcutaneous Q24H  . ezetimibe  10 mg Oral Daily  . furosemide  80 mg Intravenous TID  . insulin aspart  0-15 Units Subcutaneous TID WC  . insulin aspart  0-5 Units Subcutaneous QHS  . metolazone  5 mg Oral Daily  . sodium chloride  3 mL Intravenous Q12H  . sodium polystyrene  15 g Oral Once   Continuous Infusions:    Pamella Pertostin Gherghe, MD [facility] Pager [phone]. If 7 PM - 7 AM, please contact night-coverage at [email or web address], password  [email or web address] 01/02/2015, 12:26 PM  LOS: 3 days

## 2015-01-02 NOTE — Progress Notes (Signed)
    SUBJECTIVE:  Still SOB.  No chest pain.     PHYSICAL EXAM Filed Vitals:   01/01/15 1415 01/01/15 1733 01/01/15 1955 01/02/15 0537  BP: 89/60 100/69 104/76 121/79  Pulse: 74 87 84 79  Temp: 98 F (36.7 C)  98.3 F (36.8 C) 97.6 F (36.4 C)  TempSrc: Oral  Oral Oral  Resp:   18 18  Height:      Weight:    227 lb 3.2 oz (103.057 kg)  SpO2: 100%  100% 94%   General:  No acute distress Lungs:  Decreased breath sounds right base Heart:  RRR Abdomen:  Positive bowel sounds, no rebound no guarding Extremities:  Moderate to severe edema   LABS:  Results for orders placed or performed during the hospital encounter of 12/30/14 (from the past 24 hour(s))  Glucose, capillary     Status: Abnormal   Collection Time: 01/01/15 11:16 AM  Result Value Ref Range   Glucose-Capillary 225 (H) 65 - 99 mg/dL  Glucose, capillary     Status: Abnormal   Collection Time: 01/01/15  4:37 PM  Result Value Ref Range   Glucose-Capillary 285 (H) 65 - 99 mg/dL   Comment 1 Notify RN   Glucose, capillary     Status: Abnormal   Collection Time: 01/01/15  8:39 PM  Result Value Ref Range   Glucose-Capillary 213 (H) 65 - 99 mg/dL  Basic metabolic panel     Status: Abnormal   Collection Time: 01/02/15  3:19 AM  Result Value Ref Range   Sodium 135 135 - 145 mmol/L   Potassium 5.9 (H) 3.5 - 5.1 mmol/L   Chloride 97 (L) 101 - 111 mmol/L   CO2 28 22 - 32 mmol/L   Glucose, Bld 221 (H) 65 - 99 mg/dL   BUN 26 (H) 6 - 20 mg/dL   Creatinine, Ser 1.610.96 0.61 - 1.24 mg/dL   Calcium 8.6 (L) 8.9 - 10.3 mg/dL   GFR calc non Af Amer >60 >60 mL/min   GFR calc Af Amer >60 >60 mL/min   Anion gap 10 5 - 15  Glucose, capillary     Status: Abnormal   Collection Time: 01/02/15  6:07 AM  Result Value Ref Range   Glucose-Capillary 195 (H) 65 - 99 mg/dL    Intake/Output Summary (Last 24 hours) at 01/02/15 1004 Last data filed at 01/02/15 0913  Gross per 24 hour  Intake   1400 ml  Output   1125 ml  Net    275 ml      ASSESSMENT AND PLAN:  ACUTE ON CHRONIC SYSTOLIC AND DIASTOLIC HF:  He had only a small layering effusion yesterday on CXR so no thoracentesis.  He had atelectasis vs infiltrate.  In increased the diuretic yesterday but weights are still up.  I am going to add zaroxolyn.   Potassium is high today so I will hold this.  I will be repeating an echocardiogram.   PULMONARY INFILTRATE:  Per primary team.     [name] 01/02/2015 10:04 AM

## 2015-01-03 ENCOUNTER — Inpatient Hospital Stay (HOSPITAL_COMMUNITY): Payer: BLUE CROSS/BLUE SHIELD

## 2015-01-03 DIAGNOSIS — I509 Heart failure, unspecified: Secondary | ICD-10-CM

## 2015-01-03 LAB — PROTEIN, BODY FLUID

## 2015-01-03 LAB — GLUCOSE, CAPILLARY
GLUCOSE-CAPILLARY: 178 mg/dL — AB (ref 65–99)
GLUCOSE-CAPILLARY: 236 mg/dL — AB (ref 65–99)
Glucose-Capillary: 168 mg/dL — ABNORMAL HIGH (ref 65–99)
Glucose-Capillary: 223 mg/dL — ABNORMAL HIGH (ref 65–99)

## 2015-01-03 LAB — GRAM STAIN

## 2015-01-03 LAB — CBC
HEMATOCRIT: 40 % (ref 39.0–52.0)
HEMOGLOBIN: 12.6 g/dL — AB (ref 13.0–17.0)
MCH: 25.9 pg — ABNORMAL LOW (ref 26.0–34.0)
MCHC: 31.5 g/dL (ref 30.0–36.0)
MCV: 82.1 fL (ref 78.0–100.0)
Platelets: 175 10*3/uL (ref 150–400)
RBC: 4.87 MIL/uL (ref 4.22–5.81)
RDW: 17.2 % — AB (ref 11.5–15.5)
WBC: 9.3 10*3/uL (ref 4.0–10.5)

## 2015-01-03 LAB — BASIC METABOLIC PANEL
ANION GAP: 12 (ref 5–15)
BUN: 23 mg/dL — ABNORMAL HIGH (ref 6–20)
CHLORIDE: 93 mmol/L — AB (ref 101–111)
CO2: 27 mmol/L (ref 22–32)
Calcium: 8.6 mg/dL — ABNORMAL LOW (ref 8.9–10.3)
Creatinine, Ser: 0.92 mg/dL (ref 0.61–1.24)
Glucose, Bld: 182 mg/dL — ABNORMAL HIGH (ref 65–99)
POTASSIUM: 3.4 mmol/L — AB (ref 3.5–5.1)
SODIUM: 132 mmol/L — AB (ref 135–145)

## 2015-01-03 LAB — BODY FLUID CELL COUNT WITH DIFFERENTIAL
EOS FL: 0 %
Lymphs, Fluid: 24 %
Monocyte-Macrophage-Serous Fluid: 53 % (ref 50–90)
NEUTROPHIL FLUID: 23 % (ref 0–25)
Total Nucleated Cell Count, Fluid: 114 cu mm (ref 0–1000)

## 2015-01-03 LAB — LACTATE DEHYDROGENASE, PLEURAL OR PERITONEAL FLUID: LD FL: 70 U/L — AB (ref 3–23)

## 2015-01-03 MED ORDER — PERFLUTREN LIPID MICROSPHERE
1.0000 mL | INTRAVENOUS | Status: AC | PRN
  Administered 2015-01-03: 2 mL via INTRAVENOUS
  Filled 2015-01-03: qty 10

## 2015-01-03 MED ORDER — POTASSIUM CHLORIDE CRYS ER 20 MEQ PO TBCR
40.0000 meq | EXTENDED_RELEASE_TABLET | Freq: Once | ORAL | Status: AC
  Administered 2015-01-03: 40 meq via ORAL
  Filled 2015-01-03: qty 2

## 2015-01-03 NOTE — Procedures (Signed)
   US guided Rt thoracentesis  1.5 liters- yellow fluid Sent for labs per MD  Tolerated well cxr pending 

## 2015-01-03 NOTE — Progress Notes (Signed)
SUBJECTIVE:  Breathing somewhat better.  No "panic".  No chest pain.     PHYSICAL EXAM Filed Vitals:   01/02/15 0955 01/02/15 1244 01/02/15 2029 01/03/15 0453  BP: 118/76 105/71 105/67 99/71  Pulse: 87 76 74 75  Temp:  97.8 F (36.6 C) 97.8 F (36.6 C) 97.6 F (36.4 C)  TempSrc:  Oral Oral Oral  Resp:  20 18 18   Height:      Weight:    220 lb 11.2 oz (100.109 kg)  SpO2:  100% 100% 100%   General:  No acute distress Lungs:  Decreased breath sounds right base unchanged Heart:  RRR Abdomen:  Positive bowel sounds, no rebound no guarding Extremities:  Moderate to severe edema improved  LABS:  Results for orders placed or performed during the hospital encounter of 12/30/14 (from the past 24 hour(s))  Glucose, capillary     Status: Abnormal   Collection Time: 01/02/15 11:32 AM  Result Value Ref Range   Glucose-Capillary 242 (H) 65 - 99 mg/dL   Comment 1 Notify RN   Potassium     Status: None   Collection Time: 01/02/15  4:08 PM  Result Value Ref Range   Potassium 4.4 3.5 - 5.1 mmol/L  Glucose, capillary     Status: Abnormal   Collection Time: 01/02/15  4:17 PM  Result Value Ref Range   Glucose-Capillary 207 (H) 65 - 99 mg/dL   Comment 1 Notify RN   Glucose, capillary     Status: Abnormal   Collection Time: 01/02/15  8:48 PM  Result Value Ref Range   Glucose-Capillary 199 (H) 65 - 99 mg/dL  Basic metabolic panel     Status: Abnormal   Collection Time: 01/03/15  1:57 AM  Result Value Ref Range   Sodium 132 (L) 135 - 145 mmol/L   Potassium 3.4 (L) 3.5 - 5.1 mmol/L   Chloride 93 (L) 101 - 111 mmol/L   CO2 27 22 - 32 mmol/L   Glucose, Bld 182 (H) 65 - 99 mg/dL   BUN 23 (H) 6 - 20 mg/dL   Creatinine, Ser 4.090.92 0.61 - 1.24 mg/dL   Calcium 8.6 (L) 8.9 - 10.3 mg/dL   GFR calc non Af Amer >60 >60 mL/min   GFR calc Af Amer >60 >60 mL/min   Anion gap 12 5 - 15  CBC     Status: Abnormal   Collection Time: 01/03/15  1:57 AM  Result Value Ref Range   WBC 9.3 4.0 - 10.5  K/uL   RBC 4.87 4.22 - 5.81 MIL/uL   Hemoglobin 12.6 (L) 13.0 - 17.0 g/dL   HCT 81.140.0 91.439.0 - 78.252.0 %   MCV 82.1 78.0 - 100.0 fL   MCH 25.9 (L) 26.0 - 34.0 pg   MCHC 31.5 30.0 - 36.0 g/dL   RDW 95.617.2 (H) 21.311.5 - 08.615.5 %   Platelets 175 150 - 400 K/uL  Glucose, capillary     Status: Abnormal   Collection Time: 01/03/15  6:50 AM  Result Value Ref Range   Glucose-Capillary 168 (H) 65 - 99 mg/dL    Intake/Output Summary (Last 24 hours) at 01/03/15 0951 Last data filed at 01/03/15 0920  Gross per 24 hour  Intake   1200 ml  Output   5075 ml  Net  -3875 ml     ASSESSMENT AND PLAN:  ACUTE ON CHRONIC SYSTOLIC AND DIASTOLIC HF:  Excellent UO yesterday.  Weight is now headed down.  CT of  chest suggests that the effusion is moderate.  I would suggest thoracentesis in the AM.  Continue current diuretic.    PULMONARY INFILTRATE:  Remains on antibiotics.      Fayrene Fearing [facility] 01/03/2015 9:51 AM

## 2015-01-03 NOTE — Progress Notes (Signed)
PROGRESS NOTE  Dalton RainwaterLarry D [name] ZOX:[id number] DOB: September 04, 1955 DOA: 12/30/2014 PCP: Neldon LabellaMILLER,[name], MD  HPI: 60 y.o. male with a Past Medical History of HLD, CHF, ICD placement, smoker, DM, HTN, PVD, CAD, kidney stones who presents with acute on chronic CHF and chronic ongoing leg wounds  Subjective / 24 H Interval events - still with intermitted dyspnea   Assessment/Plan: Principal Problem:   CHF, acute on chronic (HCC) Active Problems:   Hyperlipidemia   TOBACCO ABUSE   Peripheral vascular disease (HCC)   Hypertension   Automatic implantable cardioverter-defibrillator in [employer]   Type 2 diabetes mellitus with hyperglycemia (HCC)   Acute on chronic heart failure (HCC)   Pleural effusion   Acute on chronic systolic congestive heart failure (HCC)   Hyperglycemia without ketosis   Pressure ulcer   Cellulitis of left lower extremity   Acute on chronic systolic heart failure - Elevated BNP and C x-ray with vascular congestion. Volume overload on exam. Last echo 2015 revealed ejection fraction 20-25% grade 2 diastolic dysfunction.  - cardiology consulted, appreciate input. - Continue IV diuresis, he is net -7.6 L however weight seems up from 224 >> 227 >>> 220  Plerual effusion and right infiltrate - diruesis as noted above - CT chest with moderate effusion, image personally reviewed - order UA thoracentesis with studies - ? Ascites and cirrhosis, US last year without any liver disease. Thoracentesis should differentiate if its hepatic hydrothorax vs other, obtain fluid studies including total protein and albumin  Lower extremity edema.  - Patient with a history of peripheral vascular disease. Status post recent left femoral the knee popliteal artery bypass.  - improving  LLE cellulitis - patient with chronic changes, on left I appreciate an area of cellulitis, which is improving - continue doxycycline  - wound consult  Diabetes - Home medications include 7030  metformin. - Hold metformin for now - Most recent hemoglobin A1c was a month ago and showed poor control at 9.6 - Use sliding scale insulin for optimal control - CBGs in the hospital ranged 168-182 over the past day, will not change current regimen   HTN - good control, no changes - Most recent blood pressure 118/76 this morning  Peripheral vascular disease. See #2. Recent left femoral below knee popliteal artery bypass. - Appears stable  Implantable defibrillator [employer]    Diet: Diet renal/carb modified with fluid restriction Diet-HS Snack?: Nothing; Room service appropriate?: Yes; Fluid consistency:: Thin Fluids: none  DVT Prophylaxis: Lovenox  Code Status: Full Code Family Communication:  no family bedside  Disposition Plan: home when ready   Barriers to discharge: IV diuresis   Consultants:  Cardiology   Procedures:  None    Antibiotics  Anti-infectives    Start     Dose/Rate Route Frequency Ordered Stop   01/01/15 1300  doxycycline (VIBRA-TABS) tablet 100 mg     100 mg Oral Every 12 hours 01/01/15 1247     12/31/14 1400  piperacillin-tazobactam (ZOSYN) IVPB 3.375 g  Status:  Discontinued     3.375 g 12.5 mL/hr over 240 Minutes Intravenous 3 times per day 12/31/14 1055 01/01/15 1247       Studies  Ct Chest W Contrast  01/02/2015  CLINICAL DATA:  Shortness of breath, dyspnea. Right lower infiltrate seen on chest xray^1975mL OMNIPAQUE IOHEXOL 300 MG/ML SOLN EXAM: CT CHEST WITH CONTRAST TECHNIQUE: Multidetector CT imaging of the chest was performed during intravenous contrast administration. CONTRAST:  75mL OMNIPAQUE IOHEXOL 300 MG/ML  SOLN COMPARISON:  01/01/2015 and earlier FINDINGS: Heart: There is significant coronary artery calcification. No pericardial effusion. Heart size is normal. Vascular structures: There is atherosclerotic calcification of the thoracic aorta. No aneurysm. Pulmonary arteries are grossly normal in appearance. Mediastinum/thyroid: The  visualized portion of the thyroid gland has a normal appearance. Small mediastinal lymph nodes are noted. The pre carinal lymph node is 1.7 cm. Lungs/Airways: There is consolidation of the right lower lobe, associated with air bronchograms. Moderate right pleural effusion is noted. Small left pleural effusion is present. Upper abdomen: Ascites is identified within the abdomen. Prominent caudate lobe. Question of cirrhotic changes. Chest wall/osseous structures: Negative IMPRESSION: 1. Moderate right pleural effusion. 2. Right lower lobe consolidation. 3. Small, nonspecific mediastinal lymph nodes. 4. Ascites.  Question of cirrhosis. 5. Significant Coronary artery disease. Electronically Signed   By: Norva Pavlov M.D.   On: 01/02/2015 14:25   Dg Chest Right Decubitus  01/01/2015  CLINICAL DATA:  Right-sided effusion. EXAM: CHEST - RIGHT DECUBITUS COMPARISON:  12/30/2014 FINDINGS: Patient's position right-side-down, showing a component of layering effusion on the right. There is also right basilar atelectasis and/or infiltrate. Left lung is essentially clear. Heart is enlarged. IMPRESSION: 1. Small right layering pleural effusion. 2. Significant right lower lobe atelectasis or infiltrate. Electronically Signed   By: Norva Pavlov M.D.   On: 01/01/2015 12:34    Objective  Filed Vitals:   01/02/15 0955 01/02/15 1244 01/02/15 2029 01/03/15 0453  BP: 118/76 105/71 105/67 99/71  Pulse: 87 76 74 75  Temp:  97.8 F (36.6 C) 97.8 F (36.6 C) 97.6 F (36.4 C)  TempSrc:  Oral Oral Oral  Resp:  20 18 18   Height:      Weight:    100.109 kg (220 lb 11.2 oz)  SpO2:  100% 100% 100%    Intake/Output Summary (Last 24 hours) at 01/03/15 0910 Last data filed at 01/03/15 0723  Gross per 24 hour  Intake   1200 ml  Output   5275 ml  Net  -4075 ml   Filed Weights   01/01/15 0458 01/02/15 0537 01/03/15 0453  Weight: 101.651 kg (224 lb 1.6 oz) 103.057 kg (227 lb 3.2 oz) 100.109 kg (220 lb 11.2 oz)     Exam:  GENERAL: NAD  HEENT: no scleral icterus, PERRL  NECK: supple, no LAD  LUNGS: CTA biL, no wheezing  HEART: RRR without MRG; 2+ LE edema  ABDOMEN: soft, non tender  EXTREMITIES: no clubbing / cyanosis. 1+ LE edema, cellulitic area on LLE just below knee, improving  NEUROLOGIC: non focal   Data Reviewed: Basic Metabolic Panel:  Recent Labs Lab 12/30/14 0829 12/31/14 0516 01/01/15 0555 01/02/15 0319 01/02/15 1608 01/03/15 0157  NA 136 137 134* 135  --  132*  K 3.7 3.0* 3.1* 5.9* 4.4 3.4*  CL 97* 99* 96* 97*  --  93*  CO2 28 28 28 28   --  27  GLUCOSE 301* 187* 195* 221*  --  182*  BUN 22* 22* 20 26*  --  23*  CREATININE 1.04 0.97 1.02 0.96  --  0.92  CALCIUM 8.8* 8.4* 8.2* 8.6*  --  8.6*   Liver Function Tests:  Recent Labs Lab 12/30/14 0829  AST 23  ALT 19  ALKPHOS 130*  BILITOT 1.1  PROT 7.4  ALBUMIN 2.9*   CBC:  Recent Labs Lab 12/30/14 0829 01/03/15 0157  WBC 8.2 9.3  NEUTROABS 6.4  --   HGB 12.4* 12.6*  HCT 40.9  40.0  MCV 84.3 82.1  PLT 165 175   BNP (last 3 results)  Recent Labs  12/30/14 0830  BNP 862.8*   CBG:  Recent Labs Lab 01/02/15 0607 01/02/15 1132 01/02/15 1617 01/02/15 2048 01/03/15 0650  GLUCAP 195* 242* 207* 199* 168*   Scheduled Meds: . aspirin  81 mg Oral Daily  . atorvastatin  80 mg Oral QPM  . benazepril  5 mg Oral Daily  . carvedilol  3.125 mg Oral BID WC  . doxycycline  100 mg Oral Q12H  . enoxaparin (LOVENOX) injection  30 mg Subcutaneous Q24H  . ezetimibe  10 mg Oral Daily  . furosemide  80 mg Intravenous TID  . insulin aspart  0-15 Units Subcutaneous TID WC  . insulin aspart  0-5 Units Subcutaneous QHS  . metolazone  5 mg Oral Daily  . sodium chloride  3 mL Intravenous Q12H   Continuous Infusions:    Pamella Pert, MD [facility] Pager [phone]. If 7 PM - 7 AM, please contact night-coverage at [email or web address], password [facility] 01/03/2015, 9:10 AM  LOS: 4 days

## 2015-01-03 NOTE — Progress Notes (Signed)
  Echocardiogram 2D Echocardiogram with definity has been performed.  Leta JunglingCooper, Glendora Clouatre M 01/03/2015, 5:03 PM

## 2015-01-04 ENCOUNTER — Inpatient Hospital Stay (HOSPITAL_COMMUNITY): Payer: BLUE CROSS/BLUE SHIELD

## 2015-01-04 DIAGNOSIS — R52 Pain, unspecified: Secondary | ICD-10-CM

## 2015-01-04 LAB — BASIC METABOLIC PANEL
Anion gap: 10 (ref 5–15)
BUN: 23 mg/dL — ABNORMAL HIGH (ref 6–20)
CHLORIDE: 91 mmol/L — AB (ref 101–111)
CO2: 30 mmol/L (ref 22–32)
CREATININE: 0.82 mg/dL (ref 0.61–1.24)
Calcium: 8.4 mg/dL — ABNORMAL LOW (ref 8.9–10.3)
Glucose, Bld: 178 mg/dL — ABNORMAL HIGH (ref 65–99)
Potassium: 2.9 mmol/L — ABNORMAL LOW (ref 3.5–5.1)
SODIUM: 131 mmol/L — AB (ref 135–145)

## 2015-01-04 LAB — GLUCOSE, CAPILLARY
GLUCOSE-CAPILLARY: 173 mg/dL — AB (ref 65–99)
GLUCOSE-CAPILLARY: 260 mg/dL — AB (ref 65–99)
Glucose-Capillary: 269 mg/dL — ABNORMAL HIGH (ref 65–99)
Glucose-Capillary: 242 mg/dL — ABNORMAL HIGH (ref 65–99)

## 2015-01-04 LAB — ALBUMIN, FLUID (OTHER): Albumin, Fluid: 1.2 g/dL

## 2015-01-04 LAB — LACTATE DEHYDROGENASE: LDH: 171 U/L (ref 98–192)

## 2015-01-04 MED ORDER — METOLAZONE 2.5 MG PO TABS
2.5000 mg | ORAL_TABLET | Freq: Every day | ORAL | Status: DC
  Administered 2015-01-04 – 2015-01-05 (×2): 2.5 mg via ORAL
  Filled 2015-01-04 (×2): qty 1

## 2015-01-04 MED ORDER — ENOXAPARIN SODIUM 40 MG/0.4ML ~~LOC~~ SOLN
40.0000 mg | SUBCUTANEOUS | Status: DC
  Administered 2015-01-04: 40 mg via SUBCUTANEOUS
  Filled 2015-01-04: qty 0.4

## 2015-01-04 MED ORDER — MUPIROCIN CALCIUM 2 % EX CREA
TOPICAL_CREAM | Freq: Every day | CUTANEOUS | Status: DC
  Administered 2015-01-04 – 2015-01-05 (×2): via TOPICAL
  Filled 2015-01-04: qty 15

## 2015-01-04 MED ORDER — POTASSIUM CHLORIDE CRYS ER 20 MEQ PO TBCR
80.0000 meq | EXTENDED_RELEASE_TABLET | Freq: Two times a day (BID) | ORAL | Status: DC
Start: 2015-01-04 — End: 2015-01-05
  Administered 2015-01-04 – 2015-01-05 (×3): 80 meq via ORAL
  Filled 2015-01-04 (×3): qty 4

## 2015-01-04 NOTE — Progress Notes (Signed)
PROGRESS NOTE  Dalton RainwaterLarry D [name] ZOX:[id number] DOB: [birth year] DOA: 12/30/2014 PCP: Neldon LabellaMILLER,[name], MD  HPI: 60 y.o. male with a Past Medical History of HLD, CHF, ICD placement, smoker, DM, HTN, PVD, CAD, kidney stones who presents with acute on chronic CHF and chronic ongoing leg wounds  Subjective / 24 H Interval events - His breathing has improved today - Complains of pain behind his left knee   Assessment/Plan: Principal Problem:   CHF, acute on chronic (HCC) Active Problems:   Hyperlipidemia   TOBACCO ABUSE   Peripheral vascular disease (HCC)   Hypertension   Automatic implantable cardioverter-defibrillator in [employer]   Type 2 diabetes mellitus with hyperglycemia (HCC)   Acute on chronic heart failure (HCC)   Pleural effusion   Acute on chronic systolic congestive heart failure (HCC)   Hyperglycemia without ketosis   Pressure ulcer   Cellulitis of left lower extremity   Acute on chronic systolic heart failure - Elevated BNP and C x-ray with vascular congestion. Volume overload on exam. Last echo 2015 revealed ejection fraction 20-25% grade 2 diastolic dysfunction.  - cardiology consulted, appreciate input. - Continue IV diuresis, he is net -10.2 L weight 224 >> 227 >>> 220 >> 212  Plerual effusion and right infiltrate - diruesis as noted above - CT chest with moderate effusion s/p thoracentesis on 1/1 with removal of 1.5 L - looks exudative (LDH pending), low protein suggests hepatic hydrothorax however fluid albumin not sent. Add on.  Lower extremity edema.  - Patient with a history of peripheral vascular disease. Status post recent left femoral the knee popliteal artery bypass.  - DVT US negative  LLE cellulitis - patient with chronic changes, on left I appreciate an area of cellulitis, which is improving - continue doxycycline  - wound consult  Diabetes - Home medications include 7030 metformin. - Hold metformin for now - Most recent hemoglobin A1c was  a month ago and showed poor control at 9.6 - Use sliding scale insulin for optimal control - CBGs in the hospital ranged 170s over the past day, will not change current regimen   HTN - good control, no changes - Most recent blood pressure 95/65  Peripheral vascular disease. See #2. Recent left femoral below knee popliteal artery bypass. - Appears stable  Implantable defibrillator [employer]    Diet: Diet renal/carb modified with fluid restriction Diet-HS Snack?: Nothing; Room service appropriate?: Yes; Fluid consistency:: Thin Fluids: none  DVT Prophylaxis: Lovenox  Code Status: Full Code Family Communication:  no family bedside  Disposition Plan: home when ready   Barriers to discharge: IV diuresis   Consultants:  Cardiology   Procedures:  None    Antibiotics  Anti-infectives    Start     Dose/Rate Route Frequency Ordered Stop   01/01/15 1300  doxycycline (VIBRA-TABS) tablet 100 mg     100 mg Oral Every 12 hours 01/01/15 1247     12/31/14 1400  piperacillin-tazobactam (ZOSYN) IVPB 3.375 g  Status:  Discontinued     3.375 g 12.5 mL/hr over 240 Minutes Intravenous 3 times per day 12/31/14 1055 01/01/15 1247       Studies  Dg Chest 1 View  01/03/2015  CLINICAL DATA:  Thoracentesis EXAM: CHEST 1 VIEW COMPARISON:  12/30/2014 FINDINGS: Right pleural effusion has improved after right thoracentesis. There is no pneumothorax. Persistent pulmonary opacities at the right base. Left lung is grossly clear. Normal heart size. Left subclavian AICD device is stable. IMPRESSION: No pneumothorax post right  thoracentesis. Electronically Signed   By: Jolaine Click M.D.   On: 01/03/2015 11:38   Ct Chest W Contrast  01/02/2015  CLINICAL DATA:  Shortness of breath, dyspnea. Right lower infiltrate seen on chest xray^62mL OMNIPAQUE IOHEXOL 300 MG/ML SOLN EXAM: CT CHEST WITH CONTRAST TECHNIQUE: Multidetector CT imaging of the chest was performed during intravenous contrast administration.  CONTRAST:  75mL OMNIPAQUE IOHEXOL 300 MG/ML  SOLN COMPARISON:  01/01/2015 and earlier FINDINGS: Heart: There is significant coronary artery calcification. No pericardial effusion. Heart size is normal. Vascular structures: There is atherosclerotic calcification of the thoracic aorta. No aneurysm. Pulmonary arteries are grossly normal in appearance. Mediastinum/thyroid: The visualized portion of the thyroid gland has a normal appearance. Small mediastinal lymph nodes are noted. The pre carinal lymph node is 1.7 cm. Lungs/Airways: There is consolidation of the right lower lobe, associated with air bronchograms. Moderate right pleural effusion is noted. Small left pleural effusion is present. Upper abdomen: Ascites is identified within the abdomen. Prominent caudate lobe. Question of cirrhotic changes. Chest wall/osseous structures: Negative IMPRESSION: 1. Moderate right pleural effusion. 2. Right lower lobe consolidation. 3. Small, nonspecific mediastinal lymph nodes. 4. Ascites.  Question of cirrhosis. 5. Significant Coronary artery disease. Electronically Signed   By: Norva Pavlov M.D.   On: 01/02/2015 14:25   US Thoracentesis Asp Pleural Space W/img Guide  01/03/2015  INDICATION: Symptomatic right sided pleural effusion EXAM: US THORACENTESIS ASP PLEURAL SPACE W/IMG GUIDE COMPARISON:  None. MEDICATIONS: 10 cc 1% lidocaine COMPLICATIONS: None immediate TECHNIQUE: Informed written consent was obtained from the patient after a discussion of the risks, benefits and alternatives to treatment. A timeout was performed prior to the initiation of the procedure. Initial ultrasound scanning demonstrates a right pleural effusion. The lower chest was prepped and draped in the usual sterile fashion. 1% lidocaine was used for local anesthesia. Under direct ultrasound guidance, a 19 gauge, 7-cm, Yueh catheter was introduced. An ultrasound image was saved for documentation purposes. The thoracentesis was performed. The  catheter was removed and a dressing was applied. The patient tolerated the procedure well without immediate post procedural complication. The patient was escorted to have an upright chest radiograph. FINDINGS: A total of approximately 1.5 liters of yellow fluid was removed. Requested samples were sent to the laboratory. IMPRESSION: Successful ultrasound-guided R sided thoracentesis yielding 1.5 liters of pleural fluid. Developed worsening cough--procedure stopped Read by:  Robet Leu [facility] Electronically Signed   By: Irish Lack M.D.   On: 01/03/2015 11:08    Objective  Filed Vitals:   01/03/15 1324 01/03/15 1651 01/03/15 2052 01/04/15 0623  BP: 91/55 104/74 101/70 95/65  Pulse: 82  79 80  Temp: 97.9 F (36.6 C)  98 F (36.7 C) 97 F (36.1 C)  TempSrc: Oral  Oral Oral  Resp: 20  18 19   Height:      Weight:    96.299 kg (212 lb 4.8 oz)  SpO2: 98%  98% 97%    Intake/Output Summary (Last 24 hours) at 01/04/15 1104 Last data filed at 01/04/15 0849  Gross per 24 hour  Intake   1320 ml  Output   3350 ml  Net  -2030 ml   Filed Weights   01/02/15 0537 01/03/15 0453 01/04/15 0623  Weight: 103.057 kg (227 lb 3.2 oz) 100.109 kg (220 lb 11.2 oz) 96.299 kg (212 lb 4.8 oz)    Exam:  GENERAL: NAD  HEENT: no scleral icterus, PERRL  NECK: supple, no LAD  LUNGS: CTA biL,  no wheezing  HEART: RRR without MRG; 2+ LE edema  ABDOMEN: soft, non tender  EXTREMITIES: no clubbing / cyanosis. 1+ LE edema, cellulitic area on LLE just below knee, improving  NEUROLOGIC: non focal   Data Reviewed: Basic Metabolic Panel:  Recent Labs Lab 12/31/14 0516 01/01/15 0555 01/02/15 0319 01/02/15 1608 01/03/15 0157 01/04/15 0352  NA 137 134* 135  --  132* 131*  K 3.0* 3.1* 5.9* 4.4 3.4* 2.9*  CL 99* 96* 97*  --  93* 91*  CO2 28 28 28   --  27 30  GLUCOSE 187* 195* 221*  --  182* 178*  BUN 22* 20 26*  --  23* 23*  CREATININE 0.97 1.02 0.96  --  0.92 0.82  CALCIUM 8.4* 8.2* 8.6*  --   8.6* 8.4*   Liver Function Tests:  Recent Labs Lab 12/30/14 0829  AST 23  ALT 19  ALKPHOS 130*  BILITOT 1.1  PROT 7.4  ALBUMIN 2.9*   CBC:  Recent Labs Lab 12/30/14 0829 01/03/15 0157  WBC 8.2 9.3  NEUTROABS 6.4  --   HGB 12.4* 12.6*  HCT 40.9 40.0  MCV 84.3 82.1  PLT 165 175   BNP (last 3 results)  Recent Labs  12/30/14 0830  BNP 862.8*   CBG:  Recent Labs Lab 01/03/15 0650 01/03/15 1140 01/03/15 1638 01/03/15 2049 01/04/15 0614  GLUCAP 168* 178* 223* 236* 173*   Scheduled Meds: . aspirin  81 mg Oral Daily  . atorvastatin  80 mg Oral QPM  . benazepril  5 mg Oral Daily  . carvedilol  3.125 mg Oral BID WC  . doxycycline  100 mg Oral Q12H  . enoxaparin (LOVENOX) injection  40 mg Subcutaneous Q24H  . ezetimibe  10 mg Oral Daily  . furosemide  80 mg Intravenous TID  . insulin aspart  0-15 Units Subcutaneous TID WC  . insulin aspart  0-5 Units Subcutaneous QHS  . metolazone  2.5 mg Oral Daily  . mupirocin cream   Topical Daily  . potassium chloride  80 mEq Oral BID  . sodium chloride  3 mL Intravenous Q12H   Continuous Infusions:    Pamella Pert, MD [facility] Pager [phone]. If 7 PM - 7 AM, please contact night-coverage at [email or web address], password [facility] 01/04/2015, 11:04 AM  LOS: 5 days

## 2015-01-04 NOTE — Progress Notes (Signed)
VASCULAR LAB PRELIMINARY  PRELIMINARY  PRELIMINARY  PRELIMINARY   Bilateral Lower extremies venous Doppler completed. Bilateral:  No evidence of DVT, superficial thrombosis, or Baker's Cyst.   Jenetta Logesami Chassie Pennix, RVT, RDMS 01/04/2015, 9:32 AM

## 2015-01-04 NOTE — Progress Notes (Signed)
SUBJECTIVE:  Breathing is somewhat better.  Status post thoracentesis yesterday.  Still complaining of pain in the left popliteal area   PHYSICAL EXAM Filed Vitals:   01/03/15 1100 01/03/15 1324 01/03/15 1651 01/03/15 2052  BP: 115/78 91/55 104/74 101/70  Pulse:  82  79  Temp: 97.9 F (36.6 C) 97.9 F (36.6 C)  98 F (36.7 C)  TempSrc: Oral Oral  Oral  Resp:  20  18  Height:      Weight:      SpO2: 97% 98%  98%   General:  No acute distress Lungs:  Improved breath sounds on the right base Heart:  RRR Abdomen:  Positive bowel sounds, no rebound no guarding Extremities:  Moderate to severe edema improved, tender and firm popliteal right leg.  LABS:  Results for orders placed or performed during the hospital encounter of 12/30/14 (from the past 24 hour(s))  Glucose, capillary     Status: Abnormal   Collection Time: 01/03/15  6:50 AM  Result Value Ref Range   Glucose-Capillary 168 (H) 65 - 99 mg/dL  Lactate dehydrogenase (CSF, pleural or peritoneal fluid)     Status: Abnormal   Collection Time: 01/03/15 10:47 AM  Result Value Ref Range   LD, Fluid 70 (H) 3 - 23 U/L   Fluid Type-FLDH Pleural R   Protein, pleural or peritoneal fluid     Status: None   Collection Time: 01/03/15 10:47 AM  Result Value Ref Range   Total protein, fluid <3.0 g/dL   Fluid Type-FTP Pleural R   Body fluid cell count with differential     Status: Abnormal   Collection Time: 01/03/15 10:47 AM  Result Value Ref Range   Fluid Type-FCT Pleural R    Color, Fluid YELLOW YELLOW   Appearance, Fluid HAZY (A) CLEAR   WBC, Fluid 114 0 - 1000 cu mm   Neutrophil Count, Fluid 23 0 - 25 %   Lymphs, Fluid 24 %   Monocyte-Macrophage-Serous Fluid 53 50 - 90 %   Eos, Fluid 0 %   Other Cells, Fluid MESOTHELIAL CELLS PRESENT %  Gram stain     Status: None   Collection Time: 01/03/15 10:47 AM  Result Value Ref Range   Specimen Description FLUID PLEURAL RIGHT    Special Requests NONE    Gram Stain     RARE WBC PRESENT,BOTH PMN AND MONONUCLEAR NO ORGANISMS SEEN    Report Status 01/03/2015 FINAL   Glucose, capillary     Status: Abnormal   Collection Time: 01/03/15 11:40 AM  Result Value Ref Range   Glucose-Capillary 178 (H) 65 - 99 mg/dL  Glucose, capillary     Status: Abnormal   Collection Time: 01/03/15  4:38 PM  Result Value Ref Range   Glucose-Capillary 223 (H) 65 - 99 mg/dL  Glucose, capillary     Status: Abnormal   Collection Time: 01/03/15  8:49 PM  Result Value Ref Range   Glucose-Capillary 236 (H) 65 - 99 mg/dL   Comment 1 Notify RN    Comment 2 Document in Chart   Basic metabolic panel     Status: Abnormal   Collection Time: 01/04/15  3:52 AM  Result Value Ref Range   Sodium 131 (L) 135 - 145 mmol/L   Potassium 2.9 (L) 3.5 - 5.1 mmol/L   Chloride 91 (L) 101 - 111 mmol/L   CO2 30 22 - 32 mmol/L   Glucose, Bld 178 (H) 65 - 99 mg/dL  BUN 23 (H) 6 - 20 mg/dL   Creatinine, Ser 4.09 0.61 - 1.24 mg/dL   Calcium 8.4 (L) 8.9 - 10.3 mg/dL   GFR calc non Af Amer >60 >60 mL/min   GFR calc Af Amer >60 >60 mL/min   Anion gap 10 5 - 15    Intake/Output Summary (Last 24 hours) at 01/04/15 0556 Last data filed at 01/04/15 0038  Gross per 24 hour  Intake   1560 ml  Output   3050 ml  Net  -1490 ml     ASSESSMENT AND PLAN:  ACUTE ON CHRONIC SYSTOLIC AND DIASTOLIC HF:  Good UO yesterday.  Potassium is low.  I will give 80 meq today and repeat in the AM.   I will reduce the Zaroxolyn.  He also had 1.5 liters removed with a thoracentesis yesterday.   The echo was completed but not yet read.    PULMONARY INFILTRATE:  Remains on antibiotics.    LEG PAIN:  I will order a Doppler of the right popliteal vein.  He has a great deal of pain in this area.     Fayrene Fearing [facility] 01/04/2015 5:56 AM

## 2015-01-04 NOTE — Consult Note (Addendum)
WOC wound consult note Reason for Consult: Consult requested for BLE. He has been followed by [facility] service prior to admission. Right leg with pink dry scar tissue from previous wound which has healed to inner foot.  No open wound or drainage. Wound type: Left leg with several full thickness stasis ulcers; left outer ankle with pink dry scar tissue which has healed from a previous wound. Lower calf full thickness; .3X.3X.2cm,  .4X.4X.2cm, 2.5X1.8X.2cm.  All wounds are 90% pink and dry, 10% yellow.  No odor or drainage.   Left upper thigh with previous full thickness surgical site; remaining open area which has not closed at this time is .2X.2X.2cm, pink and dry. Dressing procedure/placement/frequency: Pt states he was previously using Santyl ointment at home for chemical debridement.  This topical treatment is no longer indicated; apply Bactroban to promote moist healing and protect with foam dressings.  Discussed plan of care with patient and he verbalized understanding. Please re-consult if further assistance is needed.  Thank-you,  Cammie Mcgeeawn Hind Chesler MSN, RN, CWOCN, [place]CN-AP, CNS [phone]

## 2015-01-05 ENCOUNTER — Telehealth: Payer: Self-pay | Admitting: Cardiology

## 2015-01-05 LAB — CBC
HEMATOCRIT: 38.4 % — AB (ref 39.0–52.0)
HEMOGLOBIN: 12.2 g/dL — AB (ref 13.0–17.0)
MCH: 25.7 pg — ABNORMAL LOW (ref 26.0–34.0)
MCHC: 31.8 g/dL (ref 30.0–36.0)
MCV: 80.8 fL (ref 78.0–100.0)
Platelets: 171 10*3/uL (ref 150–400)
RBC: 4.75 MIL/uL (ref 4.22–5.81)
RDW: 17.2 % — ABNORMAL HIGH (ref 11.5–15.5)
WBC: 9.4 10*3/uL (ref 4.0–10.5)

## 2015-01-05 LAB — GLUCOSE, CAPILLARY
GLUCOSE-CAPILLARY: 239 mg/dL — AB (ref 65–99)
Glucose-Capillary: 279 mg/dL — ABNORMAL HIGH (ref 65–99)

## 2015-01-05 LAB — COMPREHENSIVE METABOLIC PANEL
ALT: 18 U/L (ref 17–63)
ANION GAP: 10 (ref 5–15)
AST: 18 U/L (ref 15–41)
Albumin: 2.4 g/dL — ABNORMAL LOW (ref 3.5–5.0)
Alkaline Phosphatase: 121 U/L (ref 38–126)
BILIRUBIN TOTAL: 1.3 mg/dL — AB (ref 0.3–1.2)
BUN: 24 mg/dL — ABNORMAL HIGH (ref 6–20)
CO2: 31 mmol/L (ref 22–32)
Calcium: 8.5 mg/dL — ABNORMAL LOW (ref 8.9–10.3)
Chloride: 89 mmol/L — ABNORMAL LOW (ref 101–111)
Creatinine, Ser: 0.88 mg/dL (ref 0.61–1.24)
Glucose, Bld: 247 mg/dL — ABNORMAL HIGH (ref 65–99)
POTASSIUM: 3.6 mmol/L (ref 3.5–5.1)
Sodium: 130 mmol/L — ABNORMAL LOW (ref 135–145)
TOTAL PROTEIN: 6.5 g/dL (ref 6.5–8.1)

## 2015-01-05 LAB — PH, BODY FLUID: pH, Body Fluid: 7.7

## 2015-01-05 MED ORDER — POTASSIUM CHLORIDE ER 10 MEQ PO TBCR: 40.0000 meq | EXTENDED_RELEASE_TABLET | Freq: Every day | ORAL | Status: DC

## 2015-01-05 MED ORDER — DOXYCYCLINE HYCLATE 100 MG PO TABS: 100.0000 mg | ORAL_TABLET | Freq: Two times a day (BID) | ORAL | Status: DC

## 2015-01-05 NOTE — Progress Notes (Signed)
Pt has orders to be discharged. Discharge instructions given and pt has no additional questions at this time. Medication regimen reviewed and pt educated. Pt verbalized understanding and has no additional questions. Telemetry box removed. IV removed and site in good condition. Pt stable and waiting for transportation. 

## 2015-01-05 NOTE — Discharge Instructions (Signed)
Follow with Neldon LabellaMILLER,[name], MD in 5-7 days  Please get a complete blood count and chemistry panel checked by your Primary MD at your next visit, and again as instructed by your Primary MD. Please get your medications reviewed and adjusted by your Primary MD.  Please request your Primary MD to go over all Hospital Tests and Procedure/Radiological results at the follow up, please get all Hospital records sent to your Prim MD by signing hospital release before you go home.  If you had Pneumonia of Lung problems at the Hospital: Please get a 2 view Chest X ray done in 6-8 weeks after hospital discharge or sooner if instructed by your Primary MD.  If you have Congestive Heart Failure: Please call your Cardiologist or Primary MD anytime you have any of the following symptoms:  1) 3 pound weight gain in 24 hours or 5 pounds in 1 week  2) shortness of breath, with or without a dry hacking cough  3) swelling in the hands, feet or stomach  4) if you have to sleep on extra pillows at night in order to breathe  Follow cardiac low salt diet and 1.5 lit/day fluid restriction.  If you have diabetes Accuchecks 4 times/day, Once in AM empty stomach and then before each meal. Log in all results and show them to your primary doctor at your next visit. If any glucose reading is under 80 or above 300 call your primary MD immediately.  If you have Seizure/Convulsions/Epilepsy: Please do not drive, operate heavy machinery, participate in activities at heights or participate in high speed sports until you have seen by Primary MD or a Neurologist and advised to do so again.  If you had Gastrointestinal Bleeding: Please ask your Primary MD to check a complete blood count within one week of discharge or at your next visit. Your endoscopic/colonoscopic biopsies that are pending at the time of discharge, will also need to followed by your Primary MD.  Get Medicines reviewed and adjusted. Please take all your  medications with you for your next visit with your Primary MD  Please request your Primary MD to go over all hospital tests and procedure/radiological results at the follow up, please ask your Primary MD to get all Hospital records sent to his/her office.  If you experience worsening of your admission symptoms, develop shortness of breath, life threatening emergency, suicidal or homicidal thoughts you must seek medical attention immediately by calling 911 or calling your MD immediately  if symptoms less severe.  You must read complete instructions/literature along with all the possible adverse reactions/side effects for all the Medicines you take and that have been prescribed to you. Take any new Medicines after you have completely understood and accpet all the possible adverse reactions/side effects.   Do not drive or operate heavy machinery when taking Pain medications.   Do not take more than prescribed Pain, Sleep and Anxiety Medications  Special Instructions: If you have smoked or chewed Tobacco  in the last 2 yrs please stop smoking, stop any regular Alcohol  and or any Recreational drug use.  Wear Seat belts while driving.  Please note You were cared for by a hospitalist during your hospital stay. If you have any questions about your discharge medications or the care you received while you were in the hospital after you are discharged, you can call the unit and asked to speak with the hospitalist on call if the hospitalist that took care of you is not available. Once  you are discharged, your primary care physician will handle any further medical issues. Please note that NO REFILLS for any discharge medications will be authorized once you are discharged, as it is imperative that you return to your primary care physician (or establish a relationship with a primary care physician if you do not have one) for your aftercare needs so that they can reassess your need for medications and monitor your  lab values.  You can reach the hospitalist office at phone [phone] or fax [phone]   If you do not have a primary care physician, you can call [phone] for a physician referral.  Activity: As tolerated with Full fall precautions use walker/cane & assistance as needed  Diet: heart healthy, 1500 fluid restrict, 2g sodium / day   Disposition Home

## 2015-01-05 NOTE — Discharge Summary (Signed)
Physician Discharge Summary  NISHAN OVENS ZOX:[id number] DOB: 10-16-1955 DOA: 12/30/2014  PCP: [name], MD  Admit date: 12/30/2014 Discharge date: 01/05/2015  Time spent: > 30 minutes  Recommendations for Outpatient Follow-up:  1. Follow up with Cardiology in 2 days as scheduled 2. BMP in 2 days at his appointment  Discharge Diagnoses:  Principal Problem:   CHF, acute on chronic (HCC) Active Problems:   Hyperlipidemia   TOBACCO ABUSE   Peripheral vascular disease (HCC)   Hypertension   Automatic implantable cardioverter-defibrillator in [employer]   Type 2 diabetes mellitus with hyperglycemia (HCC)   Acute on chronic heart failure (HCC)   Pleural effusion   Acute on chronic systolic congestive heart failure (HCC)   Hyperglycemia without ketosis   Pressure ulcer   Cellulitis of left lower extremity  Discharge Condition: stable  Diet recommendation: heart healthy  Filed Weights   01/03/15 0453 01/04/15 0623 01/05/15 0608  Weight: 100.109 kg (220 lb 11.2 oz) 96.299 kg (212 lb 4.8 oz) 92.171 kg (203 lb 3.2 oz)    History of present illness:  See H&P, Labs, Consult and Test reports for all details in brief, patient is a 60 y.o. male with a Past Medical History of HLD, CHF, ICD placement, smoker, DM, HTN, PVD, CAD, kidney stones who presents with acute on chronic CHF and chronic ongoing leg wounds  Hospital Course:  Acute on chronic systolic heart failure - Elevated BNP and C x-ray with vascular congestion. Volume overload on exam. Last echo 2015 revealed ejection fraction 20-25% grade 2 diastolic dysfunction, repeat 2D echo as below. Cardiology consulted, have followed patient while hospitalized, patient was on IV diuresis, net negative 11.9 L, weight 222 on admission >> 203 on discharge. He will be discharged home on demadex per cardiology and has follow up in 2 days in clinic. Obtain BMP in 2 days. Plerual effusion and right infiltrate - diruesis as noted above, CT  chest with moderate effusion s/p thoracentesis on 1/1 with removal of 1.5 L.  Lower extremity edema.  - Patient with a history of peripheral vascular disease. Status post recent left femoral the knee popliteal artery bypass. DVT US negative. Edema improved with diuresis LLE cellulitis - patient with chronic changes, on left I appreciate an area of cellulitis, which is improving, continue doxycycline  Diabetes - continue home regimen, poorly controlled HTN - continue home medications  Peripheral vascular disease. See #2. Recent left femoral below knee popliteal artery bypass. Appears stable Implantable defibrillator [employer]   Procedures:  2D echo  Study Conclusions - Left ventricle: The cavity size was normal. Wall thickness wasnormal. Systolic function was severely reduced. The estimatedejection fraction was in the range of 20% to 25%. Diffusehypokinesis with inferior akinesis. The study is not technicallysufficient to allow evaluation of LV diastolic function. - Mitral valve: Thickening and tethering of the posterior leaflet.There is mild regurgitation. - Left atrium: The atrium was mildly dilated at 35 ml/m2. - Right ventricle: The cavity size was moderately dilated. Pacerwire or catheter noted in right ventricle. Systolic function isreduced. - Right atrium: The atrium was mildly dilated. Pacer wire orcatheter noted in right atrium. - Tricuspid valve: There was moderate regurgitation. - Pulmonary arteries: PA peak pressure: 50 mm Hg (S). - Inferior vena cava: The vessel was dilated. The respirophasicdiameter changes were blunted (< 50%), consistent with elevated central venous pressure.   Consultations:  Cardiology   Discharge Exam: Filed Vitals:   01/03/15 2052 01/04/15 8119 01/04/15 2122 01/05/15 1478  BP: 101/70 95/65 90/61  111/69  Pulse: 79 80 77 78  Temp: 98 F (36.7 C) 97 F (36.1 C)  98 F (36.7 C)  TempSrc: Oral Oral  Oral  Resp: 18 19 18 18   Height:        Weight:  96.299 kg (212 lb 4.8 oz)  92.171 kg (203 lb 3.2 oz)  SpO2: 98% 97% 97% 100%   General: NAD Cardiovascular: RRR Respiratory: CTA biL  Discharge Instructions Activity:  As tolerated   Get Medicines reviewed and adjusted: Please take all your medications with you for your next visit with your Primary MD  Please request your Primary MD to go over all hospital tests and procedure/radiological results at the follow up, please ask your Primary MD to get all Hospital records sent to his/her office.  If you experience worsening of your admission symptoms, develop shortness of breath, life threatening emergency, suicidal or homicidal thoughts you must seek medical attention immediately by calling 911 or calling your MD immediately if symptoms less severe.  You must read complete instructions/literature along with all the possible adverse reactions/side effects for all the Medicines you take and that have been prescribed to you. Take any new Medicines after you have completely understood and accpet all the possible adverse reactions/side effects.   Do not drive when taking Pain medications.   Do not take more than prescribed Pain, Sleep and Anxiety Medications  Special Instructions: If you have smoked or chewed Tobacco in the last 2 yrs please stop smoking, stop any regular Alcohol and or any Recreational drug use.  Wear Seat belts while driving.  Please note  You were cared for by a hospitalist during your hospital stay. Once you are discharged, your primary care physician will handle any further medical issues. Please note that NO REFILLS for any discharge medications will be authorized once you are discharged, as it is imperative that you return to your primary care physician (or establish a relationship with a primary care physician if you do not have one) for your aftercare needs so that they can reassess your need for medications and monitor your lab values.    Medication  List    STOP taking these medications        cephALEXin 500 MG capsule  Commonly known as:  KEFLEX      TAKE these medications        aspirin 81 MG tablet  Take 1 tablet (81 mg total) by mouth daily.     atorvastatin 80 MG tablet  Commonly known as:  LIPITOR  Take 1 tablet (80 mg total) by mouth every other day.     benazepril 5 MG tablet  Commonly known as:  LOTENSIN  Take 1 tablet (5 mg total) by mouth daily.     carvedilol 6.25 MG tablet  Commonly known as:  COREG  Take 1 tablet (6.25 mg total) by mouth 2 (two) times daily with a meal.     doxycycline 100 MG tablet  Commonly known as:  VIBRA-TABS  Take 1 tablet (100 mg total) by mouth every 12 (twelve) hours.     ezetimibe 10 MG tablet  Commonly known as:  ZETIA  Take 1 tablet (10 mg total) by mouth daily.     ibuprofen 200 MG tablet  Commonly known as:  ADVIL,MOTRIN  Take 200 mg by mouth every 6 (six) hours as needed for moderate pain.     insulin NPH Human 100 UNIT/ML injection  Commonly known as:  HUMULIN N,NOVOLIN N  Inject 10 Units into the skin daily.     insulin NPH-regular Human (70-30) 100 UNIT/ML injection  Commonly known as:  NOVOLIN 70/30  Inject 16-26 Units into the skin 2 (two) times daily with a meal. 26 units in the morning, 16 units in the evening     JARDIANCE 25 MG Tabs tablet  Generic drug:  empagliflozin  Take 25 mg by mouth daily.     metFORMIN 500 MG tablet  Commonly known as:  GLUCOPHAGE  Take 500 mg by mouth daily with breakfast.     oxyCODONE 5 MG immediate release tablet  Commonly known as:  ROXICODONE  Take 1 tablet (5 mg total) by mouth every 6 (six) hours as needed.     potassium chloride 10 MEQ tablet  Commonly known as:  K-DUR  Take 4 tablets (40 mEq total) by mouth daily.     spironolactone 25 MG tablet  Commonly known as:  ALDACTONE  TAKE ONE-HALF TABLET BY MOUTH ONCE DAILY     torsemide 20 MG tablet  Commonly known as:  DEMADEX  Take 3 tablets (60 mg total) by  mouth once.           Follow-up Information    Follow up with [name], MD. Go on 01/07/2015.   Specialty:  Family Medicine   Why:  @11 :45pm   Contact information:   [address] [place] [address] [phone]       Follow up with [facility]. Go on 01/07/2015.   Specialty:  Cardiology   Why:  @9 :30 for TCM appoinment with Darrol Jump, PA-C   Contact information:   [address] [place] [address] [phone]     The results of significant diagnostics from this hospitalization (including imaging, microbiology, ancillary and laboratory) are listed below for reference.    Significant Diagnostic Studies: Dg Chest 1 View  01/03/2015  CLINICAL DATA:  Thoracentesis EXAM: CHEST 1 VIEW COMPARISON:  12/30/2014 FINDINGS: Right pleural effusion has improved after right thoracentesis. There is no pneumothorax. Persistent pulmonary opacities at the right base. Left lung is grossly clear. Normal heart size. Left subclavian AICD device is stable. IMPRESSION: No pneumothorax post right thoracentesis. Electronically Signed   By: Jolaine Click M.D.   On: 01/03/2015 11:38   Dg Chest 2 View  12/30/2014  CLINICAL DATA:  Leg swelling. EXAM: CHEST  2 VIEW COMPARISON:  11/19/2014. FINDINGS: Mediastinum and hilar structures normal. Cardiac pacer with lead tip over the right ventricle. Cardiomegaly with pulmonary vascular prominence and mild interstitial prominence consistent mild congestive heart failure. Right lower lobe dense atelectasis and or infiltrate. Spot right pleural effusion. No pneumothorax . IMPRESSION: 1. Cardiac pacer stable position. Cardiomegaly with pulmonary vascular prominence and bilateral interstitial prominence consistent congestive heart failure. Right pleural effusion noted. 2. Dense right lower lobe atelectasis and/or infiltrate . Electronically Signed   By: Maisie Fus  Register   On: 12/30/2014 08:44   Ct Chest W  Contrast  01/02/2015  CLINICAL DATA:  Shortness of breath, dyspnea. Right lower infiltrate seen on chest xray^93mL OMNIPAQUE IOHEXOL 300 MG/ML SOLN EXAM: CT CHEST WITH CONTRAST TECHNIQUE: Multidetector CT imaging of the chest was performed during intravenous contrast administration. CONTRAST:  75mL OMNIPAQUE IOHEXOL 300 MG/ML  SOLN COMPARISON:  01/01/2015 and earlier FINDINGS: Heart: There is significant coronary artery calcification. No pericardial effusion. Heart size is normal. Vascular structures: There is atherosclerotic calcification of the thoracic aorta. No aneurysm. Pulmonary arteries  are grossly normal in appearance. Mediastinum/thyroid: The visualized portion of the thyroid gland has a normal appearance. Small mediastinal lymph nodes are noted. The pre carinal lymph node is 1.7 cm. Lungs/Airways: There is consolidation of the right lower lobe, associated with air bronchograms. Moderate right pleural effusion is noted. Small left pleural effusion is present. Upper abdomen: Ascites is identified within the abdomen. Prominent caudate lobe. Question of cirrhotic changes. Chest wall/osseous structures: Negative IMPRESSION: 1. Moderate right pleural effusion. 2. Right lower lobe consolidation. 3. Small, nonspecific mediastinal lymph nodes. 4. Ascites.  Question of cirrhosis. 5. Significant Coronary artery disease. Electronically Signed   By: Norva PavlovElizabeth  Brown M.D.   On: 01/02/2015 14:25   Dg Chest Right Decubitus  01/01/2015  CLINICAL DATA:  Right-sided effusion. EXAM: CHEST - RIGHT DECUBITUS COMPARISON:  12/30/2014 FINDINGS: Patient's position right-side-down, showing a component of layering effusion on the right. There is also right basilar atelectasis and/or infiltrate. Left lung is essentially clear. Heart is enlarged. IMPRESSION: 1. Small right layering pleural effusion. 2. Significant right lower lobe atelectasis or infiltrate. Electronically Signed   By: Norva PavlovElizabeth  Brown M.D.   On: 01/01/2015 12:34    [place] Thoracentesis Asp Pleural Space W/img Guide  01/03/2015  INDICATION: Symptomatic right sided pleural effusion EXAM: US THORACENTESIS ASP PLEURAL SPACE W/IMG GUIDE COMPARISON:  None. MEDICATIONS: 10 cc 1% lidocaine COMPLICATIONS: None immediate TECHNIQUE: Informed written consent was obtained from the patient after a discussion of the risks, benefits and alternatives to treatment. A timeout was performed prior to the initiation of the procedure. Initial ultrasound scanning demonstrates a right pleural effusion. The lower chest was prepped and draped in the usual sterile fashion. 1% lidocaine was used for local anesthesia. Under direct ultrasound guidance, a 19 gauge, 7-cm, Yueh catheter was introduced. An ultrasound image was saved for documentation purposes. The thoracentesis was performed. The catheter was removed and a dressing was applied. The patient tolerated the procedure well without immediate post procedural complication. The patient was escorted to have an upright chest radiograph. FINDINGS: A total of approximately 1.5 liters of yellow fluid was removed. Requested samples were sent to the laboratory. IMPRESSION: Successful ultrasound-guided R sided thoracentesis yielding 1.5 liters of pleural fluid. Developed worsening cough--procedure stopped Read by:  Robet LeuPamela A Turpin [facility] Electronically Signed   By: Irish LackGlenn  Yamagata M.D.   On: 01/03/2015 11:08   Microbiology: Recent Results (from the past 240 hour(s))  Culture, body fluid-bottle     Status: None (Preliminary result)   Collection Time: 01/03/15 10:47 AM  Result Value Ref Range Status   Specimen Description FLUID PLEURAL RIGHT  Final   Special Requests NONE  Final   Culture NO GROWTH < 24 HOURS  Final   Report Status PENDING  Incomplete  Gram stain     Status: None   Collection Time: 01/03/15 10:47 AM  Result Value Ref Range Status   Specimen Description FLUID PLEURAL RIGHT  Final   Special Requests NONE  Final   Gram Stain   Final     RARE WBC PRESENT,BOTH PMN AND MONONUCLEAR NO ORGANISMS SEEN    Report Status 01/03/2015 FINAL  Final   Labs: Basic Metabolic Panel:  Recent Labs Lab 12/30/14 0829 12/31/14 0516 01/01/15 0555 01/02/15 0319 01/02/15 1608 01/03/15 0157 01/04/15 0352 01/05/15 0435  NA 136 137 134* 135  --  132* 131* 130*  K 3.7 3.0* 3.1* 5.9* 4.4 3.4* 2.9* 3.6  CL 97* 99* 96* 97*  --  93* 91* 89*  CO2  28 28 28 28   --  27 30 31   GLUCOSE 301* 187* 195* 221*  --  182* 178* 247*  BUN 22* 22* 20 26*  --  23* 23* 24*  CREATININE 1.04 0.97 1.02 0.96  --  0.92 0.82 0.88  CALCIUM 8.8* 8.4* 8.2* 8.6*  --  8.6* 8.4* 8.5*   Liver Function Tests:  Recent Labs Lab 12/30/14 0829 01/05/15 0435  AST 23 18  ALT 19 18  ALKPHOS 130* 121  BILITOT 1.1 1.3*  PROT 7.4 6.5  ALBUMIN 2.9* 2.4*   CBC:  Recent Labs Lab 12/30/14 0829 01/03/15 0157 01/05/15 0435  WBC 8.2 9.3 9.4  NEUTROABS 6.4  --   --   HGB 12.4* 12.6* 12.2*  HCT 40.9 40.0 38.4*  MCV 84.3 82.1 80.8  PLT 165 175 171   BNP: BNP (last 3 results)  Recent Labs  12/30/14 0830  BNP 862.8*   CBG:  Recent Labs Lab 01/04/15 1122 01/04/15 1628 01/04/15 2118 01/05/15 0606 01/05/15 1218  GLUCAP 269* 260* 242* 239* 279*    Signed:  Alexismarie Flaim  [facility] 01/05/2015, 1:03 PM

## 2015-01-05 NOTE — Progress Notes (Signed)
Inpatient Diabetes Program Recommendations  AACE/ADA: New Consensus Statement on Inpatient Glycemic Control (2015)  Target Ranges:  Prepandial:   less than 140 mg/dL      Peak postprandial:   less than 180 mg/dL (1-2 hours)      Critically ill patients:  140 - 180 mg/dL   Review of Glycemic Control Results for Lynda RainwaterFULP, Nyzier D (MRN [id number]) as of 01/05/2015 12:12  Ref. Range 01/04/2015 11:22 01/04/2015 16:28 01/04/2015 21:18 01/05/2015 06:06  Glucose-Capillary Latest Ref Range: 65-99 mg/dL 409269 (H) 811260 (H) 914242 (H) 239 (H)   Inpatient Diabetes Program Recommendations:  Insulin - Basal: consider adding Lantus 18 units  Thank you  Piedad ClimesGina Keandrea Tapley BSN, RN,CDE Inpatient Diabetes Coordinator [phone] (team pager)

## 2015-01-05 NOTE — Progress Notes (Signed)
SUBJECTIVE:  Breathing better.  Mild cough.     PHYSICAL EXAM Filed Vitals:   01/03/15 2052 01/04/15 0623 01/04/15 2122 01/05/15 0608  BP: 101/70 95/65 90/61  111/69  Pulse: 79 80 77 78  Temp: 98 F (36.7 C) 97 F (36.1 C)  98 F (36.7 C)  TempSrc: Oral Oral  Oral  Resp: 18 19 18 18   Height:      Weight:  212 lb 4.8 oz (96.299 kg)  203 lb 3.2 oz (92.171 kg)  SpO2: 98% 97% 97% 100%   General:  No acute distress Lungs:  Improved breath sounds on the right base Heart:  RRR Abdomen:  Positive bowel sounds, no rebound no guarding Extremities:  Moderate to severe edema improved.    LABS:  Results for orders placed or performed during the hospital encounter of 12/30/14 (from the past 24 hour(s))  Glucose, capillary     Status: Abnormal   Collection Time: 01/04/15 11:22 AM  Result Value Ref Range   Glucose-Capillary 269 (H) 65 - 99 mg/dL   Comment 1 Notify RN   Lactate dehydrogenase     Status: None   Collection Time: 01/04/15  1:50 PM  Result Value Ref Range   LDH 171 98 - 192 U/L  Glucose, capillary     Status: Abnormal   Collection Time: 01/04/15  4:28 PM  Result Value Ref Range   Glucose-Capillary 260 (H) 65 - 99 mg/dL   Comment 1 Notify RN   Glucose, capillary     Status: Abnormal   Collection Time: 01/04/15  9:18 PM  Result Value Ref Range   Glucose-Capillary 242 (H) 65 - 99 mg/dL   Comment 1 Notify RN    Comment 2 Document in Chart   Comprehensive metabolic panel     Status: Abnormal   Collection Time: 01/05/15  4:35 AM  Result Value Ref Range   Sodium 130 (L) 135 - 145 mmol/L   Potassium 3.6 3.5 - 5.1 mmol/L   Chloride 89 (L) 101 - 111 mmol/L   CO2 31 22 - 32 mmol/L   Glucose, Bld 247 (H) 65 - 99 mg/dL   BUN 24 (H) 6 - 20 mg/dL   Creatinine, Ser 1.610.88 0.61 - 1.24 mg/dL   Calcium 8.5 (L) 8.9 - 10.3 mg/dL   Total Protein 6.5 6.5 - 8.1 g/dL   Albumin 2.4 (L) 3.5 - 5.0 g/dL   AST 18 15 - 41 U/L   ALT 18 17 - 63 U/L   Alkaline Phosphatase 121 38 - 126 U/L     Total Bilirubin 1.3 (H) 0.3 - 1.2 mg/dL   GFR calc non Af Amer >60 >60 mL/min   GFR calc Af Amer >60 >60 mL/min   Anion gap 10 5 - 15  CBC     Status: Abnormal   Collection Time: 01/05/15  4:35 AM  Result Value Ref Range   WBC 9.4 4.0 - 10.5 K/uL   RBC 4.75 4.22 - 5.81 MIL/uL   Hemoglobin 12.2 (L) 13.0 - 17.0 g/dL   HCT 09.638.4 (L) 04.539.0 - 40.952.0 %   MCV 80.8 78.0 - 100.0 fL   MCH 25.7 (L) 26.0 - 34.0 pg   MCHC 31.8 30.0 - 36.0 g/dL   RDW 81.117.2 (H) 91.411.5 - 78.215.5 %   Platelets 171 150 - 400 K/uL  Glucose, capillary     Status: Abnormal   Collection Time: 01/05/15  6:06 AM  Result Value Ref Range   Glucose-Capillary  239 (H) 65 - 99 mg/dL   Comment 1 Notify RN    Comment 2 Document in Chart     Intake/Output Summary (Last 24 hours) at 01/05/15 0746 Last data filed at 01/05/15 0646  Gross per 24 hour  Intake   1800 ml  Output   3500 ml  Net  -1700 ml   ECHO:  Left ventricle: The cavity size was normal. Wall thickness was normal. Systolic function was severely reduced. The estimated ejection fraction was in the range of 20% to 25%. Diffuse hypokinesis with inferior akinesis. The study is not technically sufficient to allow evaluation of LV diastolic function. - Mitral valve: Thickening and tethering of the posterior leaflet. There is mild regurgitation. - Left atrium: The atrium was mildly dilated at 35 ml/m2. - Right ventricle: The cavity size was moderately dilated. Pacer wire or catheter noted in right ventricle. Systolic function is reduced. - Right atrium: The atrium was mildly dilated. Pacer wire or catheter noted in right atrium. - Tricuspid valve: There was moderate regurgitation. - Pulmonary arteries: PA peak pressure: 50 mm Hg (S). - Inferior vena cava: The vessel was dilated. The respirophasic diameter changes were blunted (< 50%), consistent with elevated central venous pressure.  ASSESSMENT AND PLAN:  ACUTE ON CHRONIC SYSTOLIC AND DIASTOLIC  HF:  Good UO yesterday.   EF as above.  This is unchanged from previous.  Weight is now down about 14 lbs.  Hypotension will not allow med titration.  I think that he could go home.  I would suggest the previous dose of Torsemide and we discussed PRN dosing with an extra 20 with weight gain or edema.  He will need to have TOC follow up later this week and plans for a BMET.  He will off work for now.  I think that he should go home on 40 me KCL and 1500 cc fluid restrict with two gram Na diet.  Compression socks or thigh high wraps and keep the feet elevated above the heart.    PULMONARY INFILTRATE:  Remains on antibiotics.  Day number 5.  Duration per primary team.   LEG PAIN:  No evidence of DVT.  Enlarge lymph node.     Fayrene Fearing [facility] 01/05/2015 7:46 AM

## 2015-01-05 NOTE — Telephone Encounter (Signed)
New message    TCM appt this week per Vin PA. 1.5.2017 with [name].

## 2015-01-06 NOTE — Telephone Encounter (Signed)
Outgoing call goes to answering machine/VM  - left message for patient to return call.

## 2015-01-06 NOTE — Telephone Encounter (Signed)
[facility].  Patient understands to follow up with provider Theodore Demarkhonda Barrett on 01/07/[facility] at [facility]a [facility]. Patient understands discharge instructions? Yes Patient understands medications and regiment? Yes Patient understands to bring all medications to this visit? Yes

## 2015-01-07 ENCOUNTER — Ambulatory Visit (INDEPENDENT_AMBULATORY_CARE_PROVIDER_SITE_OTHER): Payer: BLUE CROSS/BLUE SHIELD | Admitting: Physician Assistant

## 2015-01-07 ENCOUNTER — Encounter: Payer: Self-pay | Admitting: Physician Assistant

## 2015-01-07 ENCOUNTER — Ambulatory Visit
Admission: RE | Admit: 2015-01-07 | Discharge: 2015-01-07 | Disposition: A | Discharge status: Home / Self Care | Payer: BLUE CROSS/BLUE SHIELD | Source: Ambulatory Visit | Attending: Physician Assistant | Admitting: Physician Assistant

## 2015-01-07 VITALS — BP 132/84 | HR 101 | Ht 73.0 in | Wt 202.3 lb

## 2015-01-07 DIAGNOSIS — I2510 Atherosclerotic heart disease of native coronary artery without angina pectoris: Secondary | ICD-10-CM

## 2015-01-07 DIAGNOSIS — R000 Tachycardia, unspecified: Secondary | ICD-10-CM | POA: Diagnosis not present

## 2015-01-07 DIAGNOSIS — J90 Pleural effusion, not elsewhere classified: Secondary | ICD-10-CM | POA: Diagnosis not present

## 2015-01-07 DIAGNOSIS — I5022 Chronic systolic (congestive) heart failure: Secondary | ICD-10-CM

## 2015-01-07 NOTE — Progress Notes (Signed)
Cardiology Office Note   Date:  01/07/2015   ID:  [name], DOB 08-26-1955, MRN [id number]  PCP:  [name], MD  Cardiologist:  Dr Rosemarie Beath, PA-C   Chief Complaint  Patient presents with  . Hospitalization Follow-up    no chest pain, occassional shortness of breath, has edema-hospitalized for, has pain in legs, no cramping in legs, no lightheadedness or dizziness    History of Present Illness: [name] is a 60 y.o. male with a history of MI 2005 LAD stent W/ BMS CFX, DES x 2 RCA, ICM w/ EF 25% 05/2013, HTN, DM, HL, PAD w/ carotid dz, St Jude ICD, L fem-pop 11/2014  Admit 12/28-01/03 for CHF & leg wounds  [name] presents for post hospital follow-up  He still has significant edema in his left leg and it is painful to move. He is struggling with this. He is compliant with his antibiotics and it is improved from when he was hospitalized. He is not having any fevers or chills.  He struggles with neuropathy and today had a wound in his toe that he did not notice until he saw blood on the carpet. He has an appointment with his primary care physician and he is encouraged to keep this. The toe wound was bandaged with sterile dressings.  He has chronic dyspnea on exertion and this is improved from when he was hospitalized. He still is not able to do that much and that frustrates him.  He is trying to figure out what to do about going back to work. 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  He is compliant with his medications, but the timing of these changes on a regular basis based on whether he is working or not. Since he works from 9 PM to 6 AM, he generally takes 2 of the torsemide tablets before he goes to work and then one in the morning when he gets off. He  struggles was this as that makes him not get much sleep. His edema is greatly improved. The main area of edema is in the area of the leg wound that is being treated with antibiotics.  His ICD has not been interrogated and long time. It has not fired. He has no palpitations or presyncope.   Past Medical History  Diagnosis Date  . Hyperlipidemia     mixed  . Chronic systolic heart failure (HCC)   . ICD (implantable cardiac defibrillator), dual, in situ     St. Jude for severe LVD EF 25% 2/07 explanted 2010. Medtronic Virtuoso II DR Dual-chamber cardiverter-defibrillator  with pocket revision, Dr. Graciela Husbands  . Tobacco abuse   . Ischemic cardiomyopathy     a. Echo (09/27/12): EF 20%, diffuse HK with periapical AK, no LV thrombus noted, restrictive physiology, trivial MR, mild LAE, RVSF mildly reduced, PASP 39  . Cerebrovascular disease   . Diabetes mellitus   . HTN (hypertension)   . PVD (peripheral vascular disease) (HCC)     a. ABI 09/2011 - R normal, L moderate - saw Dr. Kirke Corin - med rx.   . Carotid artery disease (HCC)     a. Dopp 09/2011: 60-79% RICA, 40-59% LICA.;  b.  Carotid US (7/14):  Bilateral 60-79% => f/u 6  mos  . Coronary artery disease     a. AWMI requiring IABP 2005 s/p Horizon study stent-LAD, staged BMS-Cx, DESx2-RCA. b. Last Last LHC (6/06):  EF 25%, pLAD stent 40-50, after stent 40, dLAD 20, pCFX 20, pOM1 70, pRCA 20, mRCA stents ok.=> med Rx;  c.  Myoview (7/14):  EF 26%, large ant, septal, inf and apical infarct, no ischemia.  Med Rx continued  . RIATA ICD Lead--on advisory recall    . Presence of permanent cardiac pacemaker   . AICD (automatic cardioverter/defibrillator) present   . Heart murmur   . History of kidney stones     Past Surgical History  Procedure Laterality Date  . Medtronic virtuoso ii dr dual-chamber cardioverter-defibrillation with pocket revison      Dr. Sherryl Manges  . Femoral-popliteal bypass graft Right 11/07/2013    Procedure: Right  FEMORAL-POPLITEAL ARTERY Bypass ;  Surgeon: Chuck Hint, MD;  Location: [facility];  Service: Vascular;  Laterality: Right;  . Intraoperative arteriogram Right 11/07/2013    Procedure: INTRA OPERATIVE ARTERIOGRAM;  Surgeon: Chuck Hint, MD;  Location: [facility] OR;  Service: Vascular;  Laterality: Right;  . Implantable cardioverter defibrillator (icd) generator change N/A 12/16/2012    Procedure: ICD GENERATOR CHANGE;  Surgeon: [name], MD;  Location: [facility] CATH LAB;  Service: Cardiovascular;  Laterality: N/A;  . Abdominal aortagram N/A 11/06/2013    Procedure: ABDOMINAL AORTAGRAM;  Surgeon: Chuck Hint, MD;  Location: [facility] CATH LAB;  Service: Cardiovascular;  Laterality: N/A;  . Lower extremity angiogram Bilateral 11/06/2013    Procedure: LOWER EXTREMITY ANGIOGRAM;  Surgeon: Chuck Hint, MD;  Location: [facility] CATH LAB;  Service: Cardiovascular;  Laterality: Bilateral;  . Peripheral vascular catheterization N/A 09/25/2014    Procedure: Abdominal Aortogram;  Surgeon: Sherren Kerns, MD;  Location: [facility] INVASIVE CV LAB;  Service: Cardiovascular;  Laterality: N/A;  . Appendectomy  2000    ruptured  . Femoral-popliteal bypass graft Left 11/17/2014    Procedure: BYPASS GRAFT FEMORAL-POPLITEAL ARTERY USING GORE PROPATEN X 80CM VASCULAR GRAFT;  Surgeon: Chuck Hint, MD;  Location: [facility] OR;  Service: Vascular;  Laterality: Left;    Current Outpatient Prescriptions  Medication Sig Dispense Refill  . aspirin 81 MG tablet Take 1 tablet (81 mg total) by mouth daily. 30 tablet 3  . atorvastatin (LIPITOR) 80 MG tablet Take 1 tablet (80 mg total) by mouth every other day. (Patient taking differently: Take 80 mg by mouth daily. ) 30 tablet 11  . benazepril (LOTENSIN) 5 MG tablet Take 1 tablet (5 mg total) by mouth daily. 30 tablet 6  . carvedilol (COREG) 6.25 MG tablet Take 1 tablet (6.25 mg total) by mouth 2 (two) times daily with a meal. (Patient taking differently: Take  3.125 mg by mouth 2 (two) times daily with a meal. ) 60 tablet 2  . doxycycline (VIBRA-TABS) 100 MG tablet Take 1 tablet (100 mg total) by mouth every 12 (twelve) hours. 20 tablet 0  . ezetimibe (ZETIA) 10 MG tablet Take 1 tablet (10 mg total) by mouth daily. 30 tablet 6  . ibuprofen (ADVIL,MOTRIN) 200 MG tablet Take 200 mg by mouth every 6 (six) hours as needed for moderate pain.    Marland Kitchen insulin NPH Human (HUMULIN N,NOVOLIN N) 100 UNIT/ML injection Inject 10 Units into the skin daily.     . insulin NPH-regular Human (NOVOLIN 70/30) (70-30) 100 UNIT/ML injection Inject 16-26 Units into the skin 2 (two) times daily with a meal.  26 units in the morning, 16 units in the evening    . JARDIANCE 25 MG TABS tablet Take 25 mg by mouth daily.    . metFORMIN (GLUCOPHAGE) 500 MG tablet Take 500 mg by mouth daily with breakfast.    . oxyCODONE (ROXICODONE) 5 MG immediate release tablet Take 1 tablet (5 mg total) by mouth every 6 (six) hours as needed. 30 tablet 0  . potassium chloride (K-DUR) 10 MEQ tablet Take 4 tablets (40 mEq total) by mouth daily. 90 tablet 0  . spironolactone (ALDACTONE) 25 MG tablet TAKE ONE-HALF TABLET BY MOUTH ONCE DAILY 30 tablet 0  . torsemide (DEMADEX) 20 MG tablet Take 3 tablets (60 mg total) by mouth once. (Patient taking differently: Take 60 mg by mouth daily. ) 90 tablet 11  . ONE TOUCH ULTRA TEST test strip 1 strip by Other route 3 (three) times daily. Use 1 strip to check glucose 3 times a day    . potassium chloride (K-DUR,KLOR-CON) 10 MEQ tablet Take 1 tablet by mouth daily. Take 1 tab daily     No current facility-administered medications for this visit.    Allergies:   Niacin and Metformin and related    Social History:  The patient  reports that he has been smoking Cigarettes.  He has a 33 pack-year smoking history. He has never used smokeless tobacco. He reports that he does not drink alcohol or use illicit drugs.   Family History:  The patient's family history  includes Coronary artery disease in his father; Diabetes in his mother.    ROS:  Please see the history of present illness. All other systems are reviewed and negative.    PHYSICAL EXAM: VS:  BP 132/84 mmHg  Pulse 101  Ht 6\' 1"  (1.854 m)  Wt 202 lb 5 oz (91.768 kg)  BMI 26.70 kg/m2 , BMI Body mass index is 26.7 kg/(m^2). GEN: Well nourished, chronically ill-appearing male in no acute distress HEENT: normal for age  Neck: no JVD, minimal hepatojugular reflux, no carotid bruit, no masses Cardiac: RRR; 3/6 murmur, no rubs, or gallops Respiratory:  Decreased breath sounds bases, R>>L, normal work of breathing GI: soft, nontender, nondistended, + BS MS: no deformity or atrophy; localized left lower extremity edema near his knee and in the area of a wound, the area has erythema and is very warm to touch but is not actively draining; distal pulses are 2+ in both upper extremities, decreased in both lower extremities  Skin: warm and dry, no rash; multiple lesions in various stages of healing; right foot second toe has a partially avulsed area starting proximal to the toenail. It is still oozing some blood. This area was bandaged and dressed. There is another wound caused by his toenail on the right third toe that was also bandaged, but it was not bleeding Neuro:  Strength and sensation are intact Psych: euthymic mood, full affect   EKG:  EKG is ordered today. The ekg ordered today demonstrates sinus tachycardia, rate 101 with possible left atrial enlargement QRS duration of 128 ms with inferior and lateral Q waves, changes are not new   Recent Labs: 12/30/2014: B Natriuretic Peptide 862.8* 01/05/2015: ALT 18; BUN 24*; Creatinine, Ser 0.88; Hemoglobin 12.2*; Platelets 171; Potassium 3.6; Sodium 130*    Lipid Panel    Component Value Date/Time   CHOL 110 01/12/2014 0848   TRIG 52 01/12/2014 0848   HDL 47 01/12/2014 0848   CHOLHDL 2.3 01/12/2014 0848   VLDL 10  01/12/2014 0848   LDLCALC 53  01/12/2014 0848   LDLDIRECT 158.4 08/13/2009 0837     Wt Readings from Last 3 Encounters:  01/07/15 202 lb 5 oz (91.768 kg)  01/05/15 203 lb 3.2 oz (92.171 kg)  12/02/14 214 lb (97.07 kg)     Other studies Reviewed: Additional studies/ records that were reviewed today include: Office Notes and hospital records.  ASSESSMENT AND PLAN:  1.  Chronic systolic CHF: His volume status is not quite at baseline and the pleural effusion is present by exam. We'll recheck chest x-ray.   Otherwise, his volume status appears to be stable. He has a small amount of edema in the area of his leg wound for which he is taking antibiotics.  2. Lower extremity wound: He is compliant with his antibiotics and will follow-up with [facility]. If his leg does not improve on the antibiotics, he is to contact them.  3. Ischemic cardiomyopathy with a St. Jude ICD: His ICD has not been interrogated in the long time and we will arrange this.  4. Toe wound: He is encouraged to keep his appointment with his primary care physician   Current medicines are reviewed at length with the patient today.  The patient does not have concerns regarding medicines.  The following changes have been made:  no change for now, that may change depending on the status of his effusion  Labs/ tests ordered today include:   Orders Placed This Encounter  Procedures  . DG Chest 2 View  . EKG 12-Lead     Disposition:   FU with Dr. Jens Somrenshaw  Signed, Leanna BattlesBarrett, Rhonda, PA-C  01/07/2015 11:41 AM    [facility] [address], [place], [place]  [address] Phone: [phone]) [phone]; Fax: [phone]

## 2015-01-07 NOTE — Patient Instructions (Signed)
A chest x-ray takes a picture of the organs and structures inside the chest, including the heart, lungs, and blood vessels. This test can show several things, including, whether the heart is enlarges; whether fluid is building up in the lungs; and whether pacemaker / defibrillator leads are still in place.  Your physician recommends that you schedule a follow-up appointment first available with Dr Jens Somrenshaw.  We will notify you with your chest xray results

## 2015-01-08 LAB — CULTURE, BODY FLUID W GRAM STAIN -BOTTLE: Culture: NO GROWTH

## 2015-01-08 LAB — CULTURE, BODY FLUID-BOTTLE

## 2015-01-14 ENCOUNTER — Ambulatory Visit (INDEPENDENT_AMBULATORY_CARE_PROVIDER_SITE_OTHER): Payer: BLUE CROSS/BLUE SHIELD | Admitting: *Deleted

## 2015-01-14 ENCOUNTER — Encounter: Payer: Self-pay | Admitting: Internal Medicine

## 2015-01-14 DIAGNOSIS — I255 Ischemic cardiomyopathy: Secondary | ICD-10-CM

## 2015-01-14 DIAGNOSIS — Z95810 Presence of automatic (implantable) cardiac defibrillator: Secondary | ICD-10-CM

## 2015-01-14 DIAGNOSIS — I5022 Chronic systolic (congestive) heart failure: Secondary | ICD-10-CM

## 2015-01-14 LAB — CUP PACEART INCLINIC DEVICE CHECK
Battery Remaining Longevity: 84 mo
Date Time Interrogation Session: 20170112101515
HIGH POWER IMPEDANCE MEASURED VALUE: 49 Ohm
Implantable Lead Model: 7001
Lead Channel Pacing Threshold Amplitude: 0.75 V
Lead Channel Pacing Threshold Amplitude: 0.75 V
Lead Channel Sensing Intrinsic Amplitude: 11.7 mV
Lead Channel Setting Sensing Sensitivity: 0.5 mV
MDC IDC LEAD IMPLANT DT: 20070207
MDC IDC LEAD LOCATION: 753860
MDC IDC MSMT LEADCHNL RV IMPEDANCE VALUE: 400 Ohm
MDC IDC MSMT LEADCHNL RV PACING THRESHOLD PULSEWIDTH: 0.5 ms
MDC IDC MSMT LEADCHNL RV PACING THRESHOLD PULSEWIDTH: 0.5 ms
MDC IDC SET LEADCHNL RV PACING AMPLITUDE: 2.5 V
MDC IDC SET LEADCHNL RV PACING PULSEWIDTH: 0.5 ms
MDC IDC STAT BRADY RV PERCENT PACED: 0 %
Pulse Gen Serial Number: 7155171

## 2015-01-14 NOTE — Progress Notes (Signed)
ICD check in clinic. Normal device function. Threshold and sensing consistent with previous device measurements. Impedance trends stable over time. No evidence of any ventricular arrhythmias. Corvue abnormal mid- late November; thoracic impedance at baseline currently. Histogram distribution appropriate for patient and level of activity. No changes made this session. Device programmed at appropriate safety margins. Device programmed to optimize intrinsic conduction. Estimated longevity 7 years. ROV with SK 04/16/15 at 8:45am.

## 2015-01-25 ENCOUNTER — Telehealth: Payer: Self-pay

## 2015-01-25 NOTE — Telephone Encounter (Signed)
Phone call from pt.  Requested an appt. To see Dr. Edilia Bo for discussion of filing for disability.  Reported he hasn't been able to return to work.  Stated he was hospitalized 4 days in late December.  Reported he had Neuropathy in both legs and c/o numbness from the knees down.  C/o an open area of left lower leg incision that is draining clear fluid. Stated he also feel like there is a musscle being pulled behind left knee.  Describes some difficulty in walking; stated he goes toward either the right or left, when attempting to walk straight.  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  Verb. He doesn't know if he is safe to do his job.  Advised will schedule appt. to discuss with Dr. Edilia Bo.

## 2015-01-26 NOTE — Telephone Encounter (Signed)
Spoke with pt to schedule his appts, he stated "if that's the best you can do". I assured him that if we had a cancellation for tomorrow I would let him know. dpm

## 2015-01-28 ENCOUNTER — Other Ambulatory Visit: Payer: Self-pay | Admitting: Internal Medicine

## 2015-02-01 ENCOUNTER — Encounter: Payer: Self-pay | Admitting: Vascular Surgery

## 2015-02-01 ENCOUNTER — Ambulatory Visit (INDEPENDENT_AMBULATORY_CARE_PROVIDER_SITE_OTHER)
Admission: RE | Admit: 2015-02-01 | Discharge: 2015-02-01 | Disposition: A | Discharge status: Home / Self Care | Payer: BLUE CROSS/BLUE SHIELD | Source: Ambulatory Visit | Attending: Vascular Surgery | Admitting: Vascular Surgery

## 2015-02-01 ENCOUNTER — Ambulatory Visit (HOSPITAL_COMMUNITY)
Admission: RE | Admit: 2015-02-01 | Discharge: 2015-02-01 | Disposition: A | Discharge status: Home / Self Care | Payer: BLUE CROSS/BLUE SHIELD | Source: Ambulatory Visit | Attending: Vascular Surgery | Admitting: Vascular Surgery

## 2015-02-01 DIAGNOSIS — E785 Hyperlipidemia, unspecified: Secondary | ICD-10-CM | POA: Insufficient documentation

## 2015-02-01 DIAGNOSIS — Z95810 Presence of automatic (implantable) cardiac defibrillator: Secondary | ICD-10-CM | POA: Diagnosis not present

## 2015-02-01 DIAGNOSIS — Z48812 Encounter for surgical aftercare following surgery on the circulatory system: Secondary | ICD-10-CM

## 2015-02-01 DIAGNOSIS — Z9889 Other specified postprocedural states: Secondary | ICD-10-CM | POA: Insufficient documentation

## 2015-02-01 DIAGNOSIS — I10 Essential (primary) hypertension: Secondary | ICD-10-CM | POA: Diagnosis not present

## 2015-02-01 DIAGNOSIS — E119 Type 2 diabetes mellitus without complications: Secondary | ICD-10-CM | POA: Insufficient documentation

## 2015-02-01 DIAGNOSIS — R938 Abnormal findings on diagnostic imaging of other specified body structures: Secondary | ICD-10-CM | POA: Insufficient documentation

## 2015-02-01 NOTE — Progress Notes (Signed)
HPI: FU CAD and CHF. S/p ant MI in 2005 req IABP tx with Horizon study stent to the LAD followed by staged BMS to the CFX and DES x 2 to RCA, Ischemic CM, systolic CHF, s/p AICD, HTN, DM2, HL, carotid stenosis, PAD. Last LHC (6/06): EF 25%, pLAD stent 40-50, after stent 40, dLAD 20, pCFX 20, pOM1 70, pRCA 20, mRCA stents ok. Abdominal US (2/11): No AAA. Myoview (7/14): EF 26%, large ant, septal, inf and apical infarct, no ischemia. Med Rx continued. Carotid Dopplers August 2016 showed 60-79% right and 40-59% left stenosis. Follow-up 12 months. Echocardiogram January 2017 showed ejection fraction 20-25%, mild mitral regurgitation, mild left atrial enlargement, moderate right ventricular enlargement and mild right atrial enlargement; moderate TR. Since last seen, Patient has some dyspnea on exertion but no orthopnea or PND. Mild pedal edema. No chest pain or syncope.  Current Outpatient Prescriptions  Medication Sig Dispense Refill  . acyclovir (ZOVIRAX) 800 MG tablet     . aspirin 81 MG tablet Take 1 tablet (81 mg total) by mouth daily. 30 tablet 3  . atorvastatin (LIPITOR) 80 MG tablet Take 1 tablet (80 mg total) by mouth every other day. (Patient taking differently: Take 80 mg by mouth daily. ) 30 tablet 11  . benazepril (LOTENSIN) 5 MG tablet Take 1 tablet (5 mg total) by mouth daily. 30 tablet 6  . carvedilol (COREG) 6.25 MG tablet Take 1 tablet (6.25 mg total) by mouth 2 (two) times daily with a meal. (Patient taking differently: Take 3.125 mg by mouth 2 (two) times daily with a meal. ) 60 tablet 2  . doxycycline (VIBRAMYCIN) 100 MG capsule Take 1 capsule (100 mg total) by mouth 2 (two) times daily. 28 capsule 1  . ezetimibe (ZETIA) 10 MG tablet Take one tablet by mouth once daily 30 tablet 0  . ibuprofen (ADVIL,MOTRIN) 200 MG tablet Take 200 mg by mouth every 6 (six) hours as needed for moderate pain.    Marland Kitchen insulin NPH Human (HUMULIN N,NOVOLIN N) 100 UNIT/ML injection Inject 10 Units into  the skin daily.     . insulin NPH-regular Human (NOVOLIN 70/30) (70-30) 100 UNIT/ML injection Inject 16-26 Units into the skin 2 (two) times daily with a meal. 26 units in the morning, 16 units in the evening    . JARDIANCE 25 MG TABS tablet Take 25 mg by mouth daily.    . metFORMIN (GLUCOPHAGE) 500 MG tablet Take 500 mg by mouth daily with breakfast.    . ONE TOUCH ULTRA TEST test strip 1 strip by Other route 3 (three) times daily. Use 1 strip to check glucose 3 times a day    . potassium chloride (K-DUR,KLOR-CON) 10 MEQ tablet Take 1 tablet by mouth daily. Take 1 tab daily    . spironolactone (ALDACTONE) 25 MG tablet TAKE ONE-HALF TABLET BY MOUTH ONCE DAILY 30 tablet 0  . torsemide (DEMADEX) 20 MG tablet Take 3 tablets (60 mg total) by mouth once. (Patient taking differently: Take 60 mg by mouth daily. ) 90 tablet 11   No current facility-administered medications for this visit.     Past Medical History  Diagnosis Date  . Hyperlipidemia     mixed  . Chronic systolic heart failure (HCC)   . ICD (implantable cardiac defibrillator), dual, in situ     St. Jude for severe LVD EF 25% 2/07 explanted 2010. Medtronic Virtuoso II DR Dual-chamber cardiverter-defibrillator  with pocket revision, Dr. Graciela Husbands  .  Tobacco abuse   . Ischemic cardiomyopathy     a. Echo (09/27/12): EF 20%, diffuse HK with periapical AK, no LV thrombus noted, restrictive physiology, trivial MR, mild LAE, RVSF mildly reduced, PASP 39  . Cerebrovascular disease   . Diabetes mellitus   . HTN (hypertension)   . PVD (peripheral vascular disease) (HCC)     a. ABI 09/2011 - R normal, L moderate - saw Dr. Kirke Corin - med rx.   . Carotid artery disease (HCC)     a. Dopp 09/2011: 60-79% RICA, 40-59% LICA.;  b.  Carotid US (7/14):  Bilateral 60-79% => f/u 6 mos  . Coronary artery disease     a. AWMI requiring IABP 2005 s/p Horizon study stent-LAD, staged BMS-Cx, DESx2-RCA. b. Last Last LHC (6/06):  EF 25%, pLAD stent 40-50, after stent 40,  dLAD 20, pCFX 20, pOM1 70, pRCA 20, mRCA stents ok.=> med Rx;  c.  Myoview (7/14):  EF 26%, large ant, septal, inf and apical infarct, no ischemia.  Med Rx continued  . RIATA ICD Lead--on advisory recall    . Presence of permanent cardiac pacemaker   . AICD (automatic cardioverter/defibrillator) present   . Heart murmur   . History of kidney stones     Past Surgical History  Procedure Laterality Date  . Medtronic virtuoso ii dr dual-chamber cardioverter-defibrillation with pocket revison      Dr. Sherryl Manges  . Femoral-popliteal bypass graft Right 11/07/2013    Procedure: Right FEMORAL-POPLITEAL ARTERY Bypass ;  Surgeon: Chuck Hint, MD;  Location: [facility] OR;  Service: Vascular;  Laterality: Right;  . Intraoperative arteriogram Right 11/07/2013    Procedure: INTRA OPERATIVE ARTERIOGRAM;  Surgeon: Chuck Hint, MD;  Location: [facility] OR;  Service: Vascular;  Laterality: Right;  . Implantable cardioverter defibrillator (icd) generator change N/A 12/16/2012    Procedure: ICD GENERATOR CHANGE;  Surgeon: [name], MD;  Location: [facility] CATH LAB;  Service: Cardiovascular;  Laterality: N/A;  . Abdominal aortagram N/A 11/06/2013    Procedure: ABDOMINAL AORTAGRAM;  Surgeon: Chuck Hint, MD;  Location: [facility] CATH LAB;  Service: Cardiovascular;  Laterality: N/A;  . Lower extremity angiogram Bilateral 11/06/2013    Procedure: LOWER EXTREMITY ANGIOGRAM;  Surgeon: Chuck Hint, MD;  Location: [facility] CATH LAB;  Service: Cardiovascular;  Laterality: Bilateral;  . Peripheral vascular catheterization N/A 09/25/2014    Procedure: Abdominal Aortogram;  Surgeon: Sherren Kerns, MD;  Location: [facility] INVASIVE CV LAB;  Service: Cardiovascular;  Laterality: N/A;  . Appendectomy  2000    ruptured  . Femoral-popliteal bypass graft Left 11/17/2014    Procedure: BYPASS GRAFT FEMORAL-POPLITEAL ARTERY USING GORE PROPATEN X 80CM VASCULAR GRAFT;  Surgeon: Chuck Hint, MD;  Location: [facility] OR;   Service: Vascular;  Laterality: Left;    Social History   Social History  . Marital Status: Single    Spouse Name: N/A  . Number of Children: N/A  . Years of Education: N/A   Occupational History  . 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 Smoking status: Current Some Day Smoker -- 1.00 packs/day for 33 years    Types: Cigarettes  . Smokeless tobacco: Never Used     Comment: smoker for 33 years  . Alcohol Use: No  . Drug Use: No     Comment: former Cocain, Acid, Marijuana - 25 years ago  . Sexual Activity: No   Other Topics Concern  .  Not on file   Social History Narrative   Divorced.    Family History  Problem Relation Age of Onset  . Diabetes Mother   . Coronary artery disease Father   . Heart disease Father     before age 7    ROS: Pain over her right shoulder and neck fromShingles but no fevers or chills, productive cough, hemoptysis, dysphasia, odynophagia, melena, hematochezia, dysuria, hematuria, rash, seizure activity, orthopnea, PND, claudication. Remaining systems are negative.  Physical Exam: Well-developed well-nourished in no acute distress.  Skin Rash over the right neck and shoulder consistent with shingles HEENT is normal.  Neck is supple.  Chest is clear to auscultation with normal expansion.  Cardiovascular exam is regular rate and rhythm. 2/6 systolic murmur left sternal border. Abdominal exam nontender or distended. No masses palpated. Extremities show 1+ ankle edema. neuro grossly intact

## 2015-02-03 ENCOUNTER — Encounter: Payer: Self-pay | Admitting: Vascular Surgery

## 2015-02-03 ENCOUNTER — Ambulatory Visit (INDEPENDENT_AMBULATORY_CARE_PROVIDER_SITE_OTHER): Payer: Self-pay | Admitting: Vascular Surgery

## 2015-02-03 VITALS — BP 113/71 | HR 94 | Temp 97.8°F | Ht 73.0 in | Wt 207.8 lb

## 2015-02-03 DIAGNOSIS — Z48812 Encounter for surgical aftercare following surgery on the circulatory system: Secondary | ICD-10-CM

## 2015-02-03 MED ORDER — DOXYCYCLINE HYCLATE 100 MG PO CAPS
100.0000 mg | ORAL_CAPSULE | Freq: Two times a day (BID) | ORAL | Status: DC
Start: 2015-02-03 — End: 2015-03-13

## 2015-02-03 NOTE — Progress Notes (Signed)
Patient name: [name] MRN: [id number] DOB: 08-Jul-1955 Sex: male  REASON FOR VISIT: Follow up after left femoral to below knee popliteal artery bypass with PTFE  HPI: [name] is a 60 y.o. male who presented with multiple venous stasis ulcers of the left leg and was found to have severe infrainguinal arterial occlusive disease. His only runoff was the peroneal artery. Vein map showed a small great saphenous vein and the vein was found to be in adequate at the time of surgery. For this reason he required a prosthetic bypass. His surgery was on 11/17/2014. I last saw him in the office on 12/02/2014. At that time he had a brisk peroneal signal with the Doppler and his venous stasis ulcers on the left leg looked about the same. We discussed the importance of tobacco cessation and intermittent leg elevation. He was to return in 3 months with a duplex of his graft and ABIs. He presents today because of an open area of his left knee incision.  He has no significant claudication symptoms in the left leg. He continues to elevate his leg for swelling. He has noted some drainage from the incision although this has improved. This was clear drainage.  Current Outpatient Prescriptions  Medication Sig Dispense Refill  . acyclovir (ZOVIRAX) 800 MG tablet     . aspirin 81 MG tablet Take 1 tablet (81 mg total) by mouth daily. 30 tablet 3  . atorvastatin (LIPITOR) 80 MG tablet Take 1 tablet (80 mg total) by mouth every other day. (Patient taking differently: Take 80 mg by mouth daily. ) 30 tablet 11  . benazepril (LOTENSIN) 5 MG tablet Take 1 tablet (5 mg total) by mouth daily. 30 tablet 6  . carvedilol (COREG) 6.25 MG tablet Take 1 tablet (6.25 mg total) by mouth 2 (two) times daily with a meal. (Patient taking differently: Take 3.125 mg by mouth 2 (two) times daily with a meal. ) 60 tablet 2  . ezetimibe (ZETIA) 10 MG tablet Take one tablet by mouth once daily 30 tablet 0  . ibuprofen (ADVIL,MOTRIN) 200  MG tablet Take 200 mg by mouth every 6 (six) hours as needed for moderate pain.    Marland Kitchen insulin NPH Human (HUMULIN N,NOVOLIN N) 100 UNIT/ML injection Inject 10 Units into the skin daily.     . insulin NPH-regular Human (NOVOLIN 70/30) (70-30) 100 UNIT/ML injection Inject 16-26 Units into the skin 2 (two) times daily with a meal. 26 units in the morning, 16 units in the evening    . JARDIANCE 25 MG TABS tablet Take 25 mg by mouth daily.    . metFORMIN (GLUCOPHAGE) 500 MG tablet Take 500 mg by mouth daily with breakfast.    . ONE TOUCH ULTRA TEST test strip 1 strip by Other route 3 (three) times daily. Use 1 strip to check glucose 3 times a day    . potassium chloride (K-DUR,KLOR-CON) 10 MEQ tablet Take 1 tablet by mouth daily. Take 1 tab daily    . spironolactone (ALDACTONE) 25 MG tablet TAKE ONE-HALF TABLET BY MOUTH ONCE DAILY 30 tablet 0  . torsemide (DEMADEX) 20 MG tablet Take 3 tablets (60 mg total) by mouth once. (Patient taking differently: Take 60 mg by mouth daily. ) 90 tablet 11  . doxycycline (VIBRA-TABS) 100 MG tablet Take 1 tablet (100 mg total) by mouth every 12 (twelve) hours. (Patient not taking: Reported on 02/03/2015) 20 tablet 0  . oxyCODONE (ROXICODONE) 5 MG immediate release tablet  Take 1 tablet (5 mg total) by mouth every 6 (six) hours as needed. (Patient not taking: Reported on 02/03/2015) 30 tablet 0  . potassium chloride (K-DUR) 10 MEQ tablet Take 4 tablets (40 mEq total) by mouth daily. (Patient not taking: Reported on 01/14/2015) 90 tablet 0   No current facility-administered medications for this visit.    REVIEW OF SYSTEMS:   denotes positive finding,  denotes negative finding Cardiac  Comments:  Chest pain or chest pressure:    Shortness of breath upon exertion:    Short of breath when lying flat:    Irregular heart rhythm:    Constitutional    Fever or chills:      PHYSICAL EXAM: Filed Vitals:   02/03/15 0854  BP: 113/71  Pulse: 94  Temp: 97.8 F (36.6 C)    TempSrc: Oral  Height:  (1.854 m)  Weight: 207 lb 12.8 oz (94.257 kg)  SpO2: 100%    GENERAL: The patient is a well-nourished male, in no acute distress. The vital signs are documented above. CARDIOVASCULAR: There is a regular rate and rhythm. PULMONARY: There is good air exchange bilaterally without wheezing or rales. He has a brisk biphasic peroneal signal with the Doppler and a monophasic dorsalis pedis signal. There is moderate left lower extremity swelling. The incisions of healed. Currently there is no open wound in the calf but there is some cellulitis.  I have reviewed the arterial Doppler study that was done on 02/01/2015. This shows a I phasic posterior tibial signal on the left with a monophasic dorsalis pedis signal. Toprol on the left was 76 mm or murky. The arteries were calcifying and ABIs were not reliable.  Graft duplex on 02/01/2015 shows the graft is patent without significant problems identified.  MEDICAL ISSUES: The patient is doing well status post a left femoral to below knee popliteal artery bypass with a PTFE graft. Given the slight cellulitis in the calf I have put him back on doxycycline. I've encouraged him to elevate his leg and we discussed the proper positioning for this. I'll see him back in 2 months. He knows to call sooner if he has problems.  [name] [facility] [place] [facility]: [phone]

## 2015-02-04 ENCOUNTER — Ambulatory Visit (INDEPENDENT_AMBULATORY_CARE_PROVIDER_SITE_OTHER): Payer: BLUE CROSS/BLUE SHIELD | Admitting: Cardiology

## 2015-02-04 ENCOUNTER — Encounter: Payer: Self-pay | Admitting: Cardiology

## 2015-02-04 VITALS — BP 98/50 | HR 70 | Ht 72.0 in | Wt 203.7 lb

## 2015-02-04 DIAGNOSIS — I739 Peripheral vascular disease, unspecified: Secondary | ICD-10-CM

## 2015-02-04 DIAGNOSIS — I255 Ischemic cardiomyopathy: Secondary | ICD-10-CM | POA: Diagnosis not present

## 2015-02-04 DIAGNOSIS — E785 Hyperlipidemia, unspecified: Secondary | ICD-10-CM

## 2015-02-04 DIAGNOSIS — I2510 Atherosclerotic heart disease of native coronary artery without angina pectoris: Secondary | ICD-10-CM

## 2015-02-04 DIAGNOSIS — I10 Essential (primary) hypertension: Secondary | ICD-10-CM

## 2015-02-04 DIAGNOSIS — F17200 Nicotine dependence, unspecified, uncomplicated: Secondary | ICD-10-CM

## 2015-02-04 DIAGNOSIS — I6529 Occlusion and stenosis of unspecified carotid artery: Secondary | ICD-10-CM

## 2015-02-04 MED ORDER — SACUBITRIL-VALSARTAN 24-26 MG PO TABS: 1.0000 | ORAL_TABLET | Freq: Two times a day (BID) | ORAL | Status: DC

## 2015-02-04 NOTE — Assessment & Plan Note (Signed)
Follow-up carotid Dopplers August 2017. 

## 2015-02-04 NOTE — Assessment & Plan Note (Signed)
Continue statin. 

## 2015-02-04 NOTE — Assessment & Plan Note (Signed)
Continue aspirin and statin. 

## 2015-02-04 NOTE — Assessment & Plan Note (Signed)
Followed by electrophysiology. 

## 2015-02-04 NOTE — Assessment & Plan Note (Signed)
Continue beta blocker. Discontinue ACE inhibitor. In 2 days begin entresto 24/26 BID; bmet one week.

## 2015-02-04 NOTE — Assessment & Plan Note (Signed)
Blood pressure controlled. Continue present medications. 

## 2015-02-04 NOTE — Assessment & Plan Note (Signed)
Volume status has improved. Continue present dose of diuretics. Patient instructed to weigh daily and take additional 20 mg of Demadex for weight gain of 2-3 pounds. Follow low-sodium diet and fluid restriction.

## 2015-02-04 NOTE — Assessment & Plan Note (Signed)
Counseled on discontinuing. 

## 2015-02-04 NOTE — Assessment & Plan Note (Signed)
Followed by vascular surgery. 

## 2015-02-04 NOTE — Patient Instructions (Signed)
Medication Instructions:   STOP BENAZEPRIL  Sunday START ENTRESTO 24/26 MG ONE TABLET TWICE DAILY  Labwork:  Your physician recommends that you return for lab work in: ONE WEEK  Follow-Up:  Your physician recommends that you schedule a follow-up appointment in: 8 WEEKS WITH DR Jens Som   If you need a refill on your cardiac medications before your next appointment, please call your pharmacy.

## 2015-03-01 ENCOUNTER — Other Ambulatory Visit (HOSPITAL_COMMUNITY): Payer: Self-pay | Admitting: Internal Medicine

## 2015-03-02 ENCOUNTER — Other Ambulatory Visit: Payer: Self-pay | Admitting: *Deleted

## 2015-03-02 MED ORDER — SPIRONOLACTONE 25 MG PO TABS: 12.5000 mg | ORAL_TABLET | Freq: Every day | ORAL | Status: DC

## 2015-03-03 ENCOUNTER — Other Ambulatory Visit (HOSPITAL_COMMUNITY): Payer: BLUE CROSS/BLUE SHIELD

## 2015-03-03 ENCOUNTER — Encounter (HOSPITAL_COMMUNITY): Payer: BLUE CROSS/BLUE SHIELD

## 2015-03-03 ENCOUNTER — Ambulatory Visit: Payer: BLUE CROSS/BLUE SHIELD | Admitting: Vascular Surgery

## 2015-03-12 ENCOUNTER — Observation Stay (HOSPITAL_COMMUNITY)
Admission: EM | Admit: 2015-03-12 | Discharge: 2015-03-13 | Disposition: A | Discharge status: Home / Self Care | Payer: BLUE CROSS/BLUE SHIELD | Attending: Internal Medicine | Admitting: Internal Medicine

## 2015-03-12 DIAGNOSIS — Z79899 Other long term (current) drug therapy: Secondary | ICD-10-CM | POA: Diagnosis not present

## 2015-03-12 DIAGNOSIS — Z9119 Patient's noncompliance with other medical treatment and regimen: Secondary | ICD-10-CM | POA: Insufficient documentation

## 2015-03-12 DIAGNOSIS — I10 Essential (primary) hypertension: Secondary | ICD-10-CM | POA: Insufficient documentation

## 2015-03-12 DIAGNOSIS — E162 Hypoglycemia, unspecified: Secondary | ICD-10-CM | POA: Diagnosis present

## 2015-03-12 DIAGNOSIS — E11649 Type 2 diabetes mellitus with hypoglycemia without coma: Secondary | ICD-10-CM | POA: Diagnosis not present

## 2015-03-12 DIAGNOSIS — I2510 Atherosclerotic heart disease of native coronary artery without angina pectoris: Secondary | ICD-10-CM | POA: Diagnosis not present

## 2015-03-12 DIAGNOSIS — I739 Peripheral vascular disease, unspecified: Secondary | ICD-10-CM | POA: Diagnosis not present

## 2015-03-12 DIAGNOSIS — E119 Type 2 diabetes mellitus without complications: Secondary | ICD-10-CM

## 2015-03-12 DIAGNOSIS — R011 Cardiac murmur, unspecified: Secondary | ICD-10-CM | POA: Diagnosis not present

## 2015-03-12 DIAGNOSIS — I5022 Chronic systolic (congestive) heart failure: Secondary | ICD-10-CM | POA: Diagnosis not present

## 2015-03-12 DIAGNOSIS — F17210 Nicotine dependence, cigarettes, uncomplicated: Secondary | ICD-10-CM | POA: Insufficient documentation

## 2015-03-12 DIAGNOSIS — Z7982 Long term (current) use of aspirin: Secondary | ICD-10-CM | POA: Insufficient documentation

## 2015-03-12 DIAGNOSIS — Z87442 Personal history of urinary calculi: Secondary | ICD-10-CM | POA: Insufficient documentation

## 2015-03-12 DIAGNOSIS — Z792 Long term (current) use of antibiotics: Secondary | ICD-10-CM | POA: Diagnosis not present

## 2015-03-12 DIAGNOSIS — K5900 Constipation, unspecified: Secondary | ICD-10-CM | POA: Insufficient documentation

## 2015-03-12 DIAGNOSIS — Z794 Long term (current) use of insulin: Secondary | ICD-10-CM | POA: Diagnosis not present

## 2015-03-12 DIAGNOSIS — N390 Urinary tract infection, site not specified: Secondary | ICD-10-CM | POA: Insufficient documentation

## 2015-03-12 DIAGNOSIS — E782 Mixed hyperlipidemia: Secondary | ICD-10-CM | POA: Insufficient documentation

## 2015-03-12 DIAGNOSIS — Z95810 Presence of automatic (implantable) cardiac defibrillator: Secondary | ICD-10-CM | POA: Insufficient documentation

## 2015-03-12 LAB — CBG MONITORING, ED: Glucose-Capillary: 119 mg/dL — ABNORMAL HIGH (ref 65–99)

## 2015-03-12 MED ORDER — DEXTROSE 5 % IV SOLN: Freq: Once | INTRAVENOUS | Status: DC

## 2015-03-12 NOTE — ED Notes (Signed)
Per EMS, patient from home. Initial complaint "patient was sitting in the floor unable to get up", A&Ox3. Patient CBG 37 by fire dept. Patient was given a sandwich and 2 glasses of milk and oral glucose. This increased to CBG to 57. Patient was still shaky, was given an amp of D50, increased CBG to 98. Patient remains groggy, stroke scale negative, patient also had an episode of incontinence.

## 2015-03-12 NOTE — ED Notes (Signed)
Bed: RESB Expected date:  Expected time:  Means of arrival:  Comments: EMS Hypoglycemia

## 2015-03-12 NOTE — ED Provider Notes (Signed)
CSN: [id number]     Arrival date & time 03/12/15  2336 History  By signing my name below, I, Gonzella Lex, attest that this documentation has been prepared under the direction and in the presence of Shyne Resch, MD. Electronically Signed: Gonzella Lex, Scribe. 03/12/2015. 12:53 AM.   Chief Complaint  Patient presents with  . Hypoglycemia   Patient is a 60 y.o. male presenting with hypoglycemia. The history is provided by the patient and the EMS personnel. No language interpreter was used.  Hypoglycemia Initial blood sugar:  37 Blood sugar after intervention:  119  Severity:  Moderate Onset quality:  Sudden Timing:  Constant Progression:  Unchanged Chronicity:  New Diabetic status:  Controlled with insulin Context: treatment noncompliance   Relieved by:  Eating (amp D50) Associated symptoms: tremors and weakness   Associated symptoms: no vomiting   Weakness:    Severity:  Mild   Onset quality:  Sudden   Timing:  Constant   Progression:  Unchanged   Chronicity:  New Risk factors: no alcohol abuse    HPI Comments: [name] is a 60 y.o. male, brought in by EMS, who presents to the Emergency Department complaining of sudden onset of hypoglycemia, with associated shakiness and weakness, which began earlier this evening when pt was sitting on the floor for an unknown amount of time and was unable to get up. Pt's sugar was 37 per EMS before he was given a peanut butter and salad dressing sandwich and two glasses of milk, which increased his CBG to 57. Pt was alert but shaky still, but when given an amp of D50 his blood sugar increased to 98. Pt is on 70/30 insulin which he took this morning along with his novolin medication. He states that he has a tendency to dry heave. It was also reported that the pt's blood sugar dropped to 50 while driving earlier this week. After this, pt reported home, drank fluids, and was able to increase his blood sugar back up to 91. Pt is not  on pain medication and does not check his blood sugar. He denies diahrrea, vomting, fever, dysuria, difficulty urinating, and alcohol consumption for the past sixteen years. Pt denies alcohol consumption   Past Medical History  Diagnosis Date  . Hyperlipidemia     mixed  . Chronic systolic heart failure (HCC)   . ICD (implantable cardiac defibrillator), dual, in situ     St. Jude for severe LVD EF 25% 2/07 explanted 2010. Medtronic Virtuoso II DR Dual-chamber cardiverter-defibrillator  with pocket revision, Dr. Graciela Husbands  . Tobacco abuse   . Ischemic cardiomyopathy     a. Echo (09/27/12): EF 20%, diffuse HK with periapical AK, no LV thrombus noted, restrictive physiology, trivial MR, mild LAE, RVSF mildly reduced, PASP 39  . Cerebrovascular disease   . Diabetes mellitus   . HTN (hypertension)   . PVD (peripheral vascular disease) (HCC)     a. ABI 09/2011 - R normal, L moderate - saw Dr. Kirke Corin - med rx.   . Carotid artery disease (HCC)     a. Dopp 09/2011: 60-79% RICA, 40-59% LICA.;  b.  Carotid US (7/14):  Bilateral 60-79% => f/u 6 mos  . Coronary artery disease     a. AWMI requiring IABP 2005 s/p Horizon study stent-LAD, staged BMS-Cx, DESx2-RCA. b. Last Last LHC (6/06):  EF 25%, pLAD stent 40-50, after stent 40, dLAD 20, pCFX 20, pOM1 70, pRCA 20, mRCA stents ok.=> med Rx;  c.  Myoview (7/14):  EF 26%, large ant, septal, inf and apical infarct, no ischemia.  Med Rx continued  . RIATA ICD Lead--on advisory recall    . Presence of permanent cardiac pacemaker   . AICD (automatic cardioverter/defibrillator) present   . Heart murmur   . History of kidney stones    Past Surgical History  Procedure Laterality Date  . Medtronic virtuoso ii dr dual-chamber cardioverter-defibrillation with pocket revison      Dr. Sherryl Manges  . Femoral-popliteal bypass graft Right 11/07/2013    Procedure: Right FEMORAL-POPLITEAL ARTERY Bypass ;  Surgeon: Chuck Hint, MD;  Location: [facility] OR;  Service:  Vascular;  Laterality: Right;  . Intraoperative arteriogram Right 11/07/2013    Procedure: INTRA OPERATIVE ARTERIOGRAM;  Surgeon: Chuck Hint, MD;  Location: [facility] OR;  Service: Vascular;  Laterality: Right;  . Implantable cardioverter defibrillator (icd) generator change N/A 12/16/2012    Procedure: ICD GENERATOR CHANGE;  Surgeon: [name], MD;  Location: [facility] C[facility] LAB;  Service: Cardiovascular;  Laterality: N/A;  . Abdominal aortagram N/A 11/06/2013    Procedure: ABDOMINAL AORTAGRAM;  Surgeon: Chuck Hint, MD;  Location: [facility] CATH LAB;  Service: Cardiovascular;  Laterality: N/A;  . Lower extremity angiogram Bilateral 11/06/2013    Procedure: LOWER EXTREMITY ANGIOGRAM;  Surgeon: Chuck Hint, MD;  Location: [facility] CATH LAB;  Service: Cardiovascular;  Laterality: Bilateral;  . Peripheral vascular catheterization N/A 09/25/2014    Procedure: Abdominal Aortogram;  Surgeon: Sherren Kerns, MD;  Location: [facility] INVASIVE CV LAB;  Service: Cardiovascular;  Laterality: N/A;  . Appendectomy  2000    ruptured  . Femoral-popliteal bypass graft Left 11/17/2014    Procedure: BYPASS GRAFT FEMORAL-POPLITEAL ARTERY USING GORE PROPATEN X 80CM VASCULAR GRAFT;  Surgeon: Chuck Hint, MD;  Location: [facility] OR;  Service: Vascular;  Laterality: Left;   Family History  Problem Relation Age of Onset  . Diabetes Mother   . Coronary artery disease Father   . Heart disease Father     before age 18   Social History  Substance Use Topics  . Smoking status: Current Some Day Smoker -- 1.00 packs/day for 33 years    Types: Cigarettes  . Smokeless tobacco: Never Used     Comment: smoker for 33 years  . Alcohol Use: No    Review of Systems  Constitutional: Negative for fever.  Gastrointestinal: Negative for vomiting and diarrhea.  Endocrine:       Hypoglycemia   Genitourinary: Negative for dysuria and difficulty urinating.  Neurological: Positive for tremors and weakness.  All other  systems reviewed and are negative.  Allergies  Niacin and Metformin and related  Home Medications   Prior to Admission medications   Medication Sig Start Date End Date Taking? Authorizing Provider  acyclovir (ZOVIRAX) 800 MG tablet  02/01/15   Historical Provider, MD  aspirin 81 MG tablet Take 1 tablet (81 mg total) by mouth daily. 10/07/12   [name], MD  atorvastatin (LIPITOR) 80 MG tablet Take 1 tablet (80 mg total) by mouth every other day. Patient taking differently: Take 80 mg by mouth daily.  08/20/14   Lewayne Bunting, MD  carvedilol (COREG) 6.25 MG tablet Take 1 tablet (6.25 mg total) by mouth 2 (two) times daily with a meal. Patient taking differently: Take 3.125 mg by mouth 2 (two) times daily with a meal.  11/12/13   Richarda Overlie, MD  doxycycline (VIBRAMYCIN) 100 MG capsule Take 1 capsule (  100 mg total) by mouth 2 (two) times daily. 02/03/15   Chuck Hinthristopher S Dickson, MD  ezetimibe (ZETIA) 10 MG tablet Take one tablet by mouth once daily 01/28/15   Lewayne BuntingBrian S Crenshaw, MD  ibuprofen (ADVIL,MOTRIN) 200 MG tablet Take 200 mg by mouth every 6 (six) hours as needed for moderate pain.    Historical Provider, MD  insulin NPH Human (HUMULIN N,NOVOLIN N) 100 UNIT/ML injection Inject 10 Units into the skin daily.     Historical Provider, MD  insulin NPH-regular Human (NOVOLIN 70/30) (70-30) 100 UNIT/ML injection Inject 16-26 Units into the skin 2 (two) times daily with a meal. 26 units in the morning, 16 units in the evening    Historical Provider, MD  JARDIANCE 25 MG TABS tablet Take 25 mg by mouth daily. 03/02/14   Historical Provider, MD  metFORMIN (GLUCOPHAGE) 500 MG tablet Take 500 mg by mouth daily with breakfast.    Historical Provider, MD  potassium chloride (K-DUR,KLOR-CON) 10 MEQ tablet Take 1 tablet by mouth daily. Take 1 tab daily 01/06/15   Historical Provider, MD  sacubitril-valsartan (ENTRESTO) 24-26 MG Take 1 tablet by mouth 2 (two) times daily. 02/04/15   Lewayne BuntingBrian S Crenshaw, MD   spironolactone (ALDACTONE) 25 MG tablet Take 0.5 tablets (12.5 mg total) by mouth daily. 03/02/15   Lewayne BuntingBrian S Crenshaw, MD  torsemide (DEMADEX) 20 MG tablet Take 3 tablets (60 mg total) by mouth once. Patient taking differently: Take 60 mg by mouth daily.  08/20/14   Lewayne BuntingBrian S Crenshaw, MD   There were no vitals taken for this visit. Physical Exam  Constitutional: He is oriented to person, place, and time. He appears well-developed and well-nourished. No distress.  Alert  HENT:  Head: Normocephalic and atraumatic.  Mouth/Throat: Oropharynx is clear and moist.  Moist mucous membranes  Eyes: Conjunctivae are normal. Pupils are equal, round, and reactive to light.  Neck: Normal range of motion. Neck supple.  Trachea is midline, no bruits, no stridor  Cardiovascular: Normal rate and regular rhythm.   Pulmonary/Chest: Effort normal and breath sounds normal. No stridor. He has no wheezes. He has no rales.  Lungs clear bilaterally  Abdominal: Soft. Bowel sounds are normal. He exhibits no distension and no mass. There is no rebound and no guarding.  Mild constipation  Musculoskeletal: Normal range of motion. He exhibits no tenderness.  2+ dorsalis pedis  Lymphadenopathy:    He has no cervical adenopathy.  Neurological: He is alert and oriented to person, place, and time. He has normal reflexes.  Skin: Skin is warm and dry.  Psychiatric: He has a normal mood and affect.  Nursing note and vitals reviewed.   ED Course  Procedures  DIAGNOSTIC STUDIES:    Oxygen Saturation is 97% on RA, adequate by my interpretation.   COORDINATION OF CARE:  11:38 PM Will order lab work and will administer pt dextrose 5% solution in the ED. Discussed treatment plan with pt at bedside and pt agreed to plan.   Labs Review Labs Reviewed  CBC WITH DIFFERENTIAL/PLATELET - Abnormal; Notable for the following:    WBC 11.1 (*)    RDW 21.0 (*)    Neutro Abs 10.2 (*)    Lymphs Abs 0.6 (*)    All other components  within normal limits  COMPREHENSIVE METABOLIC PANEL - Abnormal; Notable for the following:    Sodium 134 (*)    Chloride 97 (*)    Glucose, Bld 198 (*)    BUN 24 (*)  Calcium 8.7 (*)    Total Protein 8.4 (*)    Albumin 3.2 (*)    Alkaline Phosphatase 161 (*)    Total Bilirubin 1.3 (*)    All other components within normal limits  I-STAT CHEM 8, ED - Abnormal; Notable for the following:    Chloride 95 (*)    BUN 29 (*)    Glucose, Bld 195 (*)    Calcium, Ion 1.01 (*)    Hemoglobin 17.3 (*)    All other components within normal limits  CBG MONITORING, ED - Abnormal; Notable for the following:    Glucose-Capillary 119 (*)    All other components within normal limits  CBG MONITORING, ED - Abnormal; Notable for the following:    Glucose-Capillary 193 (*)    All other components within normal limits  CK  URINALYSIS, ROUTINE W REFLEX MICROSCOPIC (NOT AT [facility])  URINE RAPID DRUG SCREEN, HOSP PERFORMED   I have personally reviewed and evaluated these lab results as part of my medical decision-making.   MDM   Final diagnoses:  None   Given confusion with severe UTI, will admit for observation of sugars and IV antibiotics and diabetic teaching as patient is taking insulin and not checking his sugars.    I personally performed the services described in this documentation, which was scribed in my presence. The recorded information has been reviewed and is accurate.     Cy Blamer, MD 03/13/15 [phone]

## 2015-03-13 ENCOUNTER — Encounter (HOSPITAL_COMMUNITY): Payer: Self-pay | Admitting: Emergency Medicine

## 2015-03-13 DIAGNOSIS — I5022 Chronic systolic (congestive) heart failure: Secondary | ICD-10-CM | POA: Diagnosis present

## 2015-03-13 DIAGNOSIS — I10 Essential (primary) hypertension: Secondary | ICD-10-CM

## 2015-03-13 DIAGNOSIS — E162 Hypoglycemia, unspecified: Secondary | ICD-10-CM | POA: Diagnosis not present

## 2015-03-13 DIAGNOSIS — Z794 Long term (current) use of insulin: Secondary | ICD-10-CM

## 2015-03-13 DIAGNOSIS — E118 Type 2 diabetes mellitus with unspecified complications: Secondary | ICD-10-CM | POA: Diagnosis not present

## 2015-03-13 DIAGNOSIS — N390 Urinary tract infection, site not specified: Secondary | ICD-10-CM

## 2015-03-13 DIAGNOSIS — E109 Type 1 diabetes mellitus without complications: Secondary | ICD-10-CM

## 2015-03-13 LAB — URINE MICROSCOPIC-ADD ON

## 2015-03-13 LAB — CBG MONITORING, ED
Glucose-Capillary: 193 mg/dL — ABNORMAL HIGH (ref 65–99)
Glucose-Capillary: 265 mg/dL — ABNORMAL HIGH (ref 65–99)

## 2015-03-13 LAB — COMPREHENSIVE METABOLIC PANEL
ALBUMIN: 3.2 g/dL — AB (ref 3.5–5.0)
ALK PHOS: 161 U/L — AB (ref 38–126)
ALT: 27 U/L (ref 17–63)
ANION GAP: 10 (ref 5–15)
AST: 36 U/L (ref 15–41)
BILIRUBIN TOTAL: 1.3 mg/dL — AB (ref 0.3–1.2)
BUN: 24 mg/dL — ABNORMAL HIGH (ref 6–20)
CALCIUM: 8.7 mg/dL — AB (ref 8.9–10.3)
CO2: 27 mmol/L (ref 22–32)
Chloride: 97 mmol/L — ABNORMAL LOW (ref 101–111)
Creatinine, Ser: 0.96 mg/dL (ref 0.61–1.24)
GFR calc non Af Amer: >60 mL/min (ref >60)
GLUCOSE: 198 mg/dL — AB (ref 65–99)
Potassium: 4.2 mmol/L (ref 3.5–5.1)
Sodium: 134 mmol/L — ABNORMAL LOW (ref 135–145)
TOTAL PROTEIN: 8.4 g/dL — AB (ref 6.5–8.1)

## 2015-03-13 LAB — CBC WITH DIFFERENTIAL/PLATELET
BASOS PCT: 0 %
Basophils Absolute: 0 10*3/uL (ref 0.0–0.1)
Eosinophils Absolute: 0 10*3/uL (ref 0.0–0.7)
Eosinophils Relative: 0 %
HEMATOCRIT: 45.6 % (ref 39.0–52.0)
HEMOGLOBIN: 14.1 g/dL (ref 13.0–17.0)
LYMPHS ABS: 0.6 10*3/uL — AB (ref 0.7–4.0)
LYMPHS PCT: 5 %
MCH: 26.2 pg (ref 26.0–34.0)
MCHC: 30.9 g/dL (ref 30.0–36.0)
MCV: 84.8 fL (ref 78.0–100.0)
MONOS PCT: 2 %
Monocytes Absolute: 0.3 10*3/uL (ref 0.1–1.0)
NEUTROS ABS: 10.2 10*3/uL — AB (ref 1.7–7.7)
NEUTROS PCT: 93 %
Platelets: 167 10*3/uL (ref 150–400)
RBC: 5.38 MIL/uL (ref 4.22–5.81)
RDW: 21 % — ABNORMAL HIGH (ref 11.5–15.5)
WBC: 11.1 10*3/uL — ABNORMAL HIGH (ref 4.0–10.5)

## 2015-03-13 LAB — URINALYSIS, ROUTINE W REFLEX MICROSCOPIC
Bilirubin Urine: NEGATIVE
Glucose, UA: >1000 mg/dL — AB
Ketones, ur: NEGATIVE mg/dL
NITRITE: NEGATIVE
PROTEIN: NEGATIVE mg/dL
Specific Gravity, Urine: 1.017 (ref 1.005–1.030)
pH: 5.5 (ref 5.0–8.0)

## 2015-03-13 LAB — RAPID URINE DRUG SCREEN, HOSP PERFORMED
AMPHETAMINES: NOT DETECTED
BENZODIAZEPINES: NOT DETECTED
Barbiturates: NOT DETECTED
Cocaine: NOT DETECTED
OPIATES: NOT DETECTED
Tetrahydrocannabinol: NOT DETECTED

## 2015-03-13 LAB — I-STAT CHEM 8, ED
BUN: 29 mg/dL — AB (ref 6–20)
Calcium, Ion: 1.01 mmol/L — ABNORMAL LOW (ref 1.13–1.30)
Chloride: 95 mmol/L — ABNORMAL LOW (ref 101–111)
Creatinine, Ser: 0.8 mg/dL (ref 0.61–1.24)
Glucose, Bld: 195 mg/dL — ABNORMAL HIGH (ref 65–99)
HEMATOCRIT: 51 % (ref 39.0–52.0)
HEMOGLOBIN: 17.3 g/dL — AB (ref 13.0–17.0)
Potassium: 4.1 mmol/L (ref 3.5–5.1)
Sodium: 139 mmol/L (ref 135–145)
TCO2: 30 mmol/L (ref 0–100)

## 2015-03-13 LAB — CK: Total CK: 151 U/L (ref 49–397)

## 2015-03-13 LAB — GLUCOSE, CAPILLARY
Glucose-Capillary: 171 mg/dL — ABNORMAL HIGH (ref 65–99)
Glucose-Capillary: 213 mg/dL — ABNORMAL HIGH (ref 65–99)

## 2015-03-13 MED ORDER — INSULIN ASPART PROT & ASPART (70-30 MIX) 100 UNIT/ML ~~LOC~~ SUSP
30.0000 [IU] | Freq: Every day | SUBCUTANEOUS | Status: DC
  Administered 2015-03-13: 30 [IU] via SUBCUTANEOUS
  Filled 2015-03-13: qty 10

## 2015-03-13 MED ORDER — POTASSIUM CHLORIDE CRYS ER 20 MEQ PO TBCR: 10.0000 meq | EXTENDED_RELEASE_TABLET | Freq: Every day | ORAL | Status: DC

## 2015-03-13 MED ORDER — LEVOFLOXACIN 250 MG PO TABS: 250.0000 mg | ORAL_TABLET | Freq: Every day | ORAL | Status: DC

## 2015-03-13 MED ORDER — ASPIRIN 81 MG PO CHEW
81.0000 mg | CHEWABLE_TABLET | Freq: Every day | ORAL | Status: DC
  Administered 2015-03-13: 81 mg via ORAL
  Filled 2015-03-13: qty 1

## 2015-03-13 MED ORDER — INSULIN DETEMIR 100 UNIT/ML ~~LOC~~ SOLN
18.0000 [IU] | Freq: Every day | SUBCUTANEOUS | Status: DC
  Filled 2015-03-13: qty 0.18

## 2015-03-13 MED ORDER — EZETIMIBE 10 MG PO TABS
10.0000 mg | ORAL_TABLET | Freq: Every day | ORAL | Status: DC
  Administered 2015-03-13: 10 mg via ORAL
  Filled 2015-03-13 (×2): qty 1

## 2015-03-13 MED ORDER — ENOXAPARIN SODIUM 40 MG/0.4ML ~~LOC~~ SOLN
40.0000 mg | SUBCUTANEOUS | Status: DC
  Administered 2015-03-13: 40 mg via SUBCUTANEOUS
  Filled 2015-03-13 (×2): qty 0.4

## 2015-03-13 MED ORDER — SACUBITRIL-VALSARTAN 24-26 MG PO TABS
1.0000 | ORAL_TABLET | Freq: Two times a day (BID) | ORAL | Status: DC
  Administered 2015-03-13: 1 via ORAL
  Filled 2015-03-13 (×2): qty 1

## 2015-03-13 MED ORDER — METFORMIN HCL 500 MG PO TABS: 500.0000 mg | ORAL_TABLET | Freq: Every day | ORAL | Status: DC

## 2015-03-13 MED ORDER — INSULIN ASPART 100 UNIT/ML ~~LOC~~ SOLN: 0.0000 [IU] | Freq: Three times a day (TID) | SUBCUTANEOUS | Status: DC

## 2015-03-13 MED ORDER — POTASSIUM CHLORIDE 20 MEQ/15ML (10%) PO SOLN
10.0000 meq | Freq: Every day | ORAL | Status: DC
  Administered 2015-03-13: 10 meq via ORAL
  Filled 2015-03-13: qty 15

## 2015-03-13 MED ORDER — SPIRONOLACTONE 25 MG PO TABS
12.5000 mg | ORAL_TABLET | Freq: Every day | ORAL | Status: DC
  Administered 2015-03-13: 12.5 mg via ORAL
  Filled 2015-03-13 (×2): qty 1

## 2015-03-13 MED ORDER — CANAGLIFLOZIN 100 MG PO TABS: 100.0000 mg | ORAL_TABLET | Freq: Every day | ORAL | Status: DC

## 2015-03-13 MED ORDER — IBUPROFEN 200 MG PO TABS: 200.0000 mg | ORAL_TABLET | Freq: Four times a day (QID) | ORAL | Status: DC | PRN

## 2015-03-13 MED ORDER — DEXTROSE 5 % IV SOLN: 1.0000 g | INTRAVENOUS | Status: DC

## 2015-03-13 MED ORDER — DEXTROSE 5 % IV SOLN
1.0000 g | Freq: Once | INTRAVENOUS | Status: AC
  Administered 2015-03-13: 1 g via INTRAVENOUS
  Filled 2015-03-13: qty 10

## 2015-03-13 MED ORDER — DOXYCYCLINE HYCLATE 100 MG PO TABS
100.0000 mg | ORAL_TABLET | Freq: Two times a day (BID) | ORAL | Status: DC
  Administered 2015-03-13: 100 mg via ORAL
  Filled 2015-03-13: qty 1

## 2015-03-13 MED ORDER — CARVEDILOL 3.125 MG PO TABS
3.1250 mg | ORAL_TABLET | Freq: Two times a day (BID) | ORAL | Status: DC
  Administered 2015-03-13: 3.125 mg via ORAL
  Filled 2015-03-13 (×4): qty 1

## 2015-03-13 MED ORDER — ATORVASTATIN CALCIUM 40 MG PO TABS
80.0000 mg | ORAL_TABLET | Freq: Every day | ORAL | Status: DC
  Administered 2015-03-13: 80 mg via ORAL
  Filled 2015-03-13: qty 2
  Filled 2015-03-13: qty 1

## 2015-03-13 MED ORDER — INSULIN ASPART 100 UNIT/ML ~~LOC~~ SOLN
0.0000 [IU] | Freq: Every day | SUBCUTANEOUS | Status: DC
  Administered 2015-03-13: 5 [IU] via SUBCUTANEOUS

## 2015-03-13 MED ORDER — TORSEMIDE 20 MG PO TABS
60.0000 mg | ORAL_TABLET | Freq: Every day | ORAL | Status: DC
  Administered 2015-03-13: 60 mg via ORAL
  Filled 2015-03-13: qty 3

## 2015-03-13 MED ORDER — INSULIN ASPART PROT & ASPART (70-30 MIX) 100 UNIT/ML ~~LOC~~ SUSP
24.0000 [IU] | Freq: Every day | SUBCUTANEOUS | Status: DC
  Filled 2015-03-13: qty 10

## 2015-03-13 NOTE — ED Notes (Signed)
Father Rebekah ChesterfieldDanny Kerth [phone]

## 2015-03-13 NOTE — Progress Notes (Signed)
Patient arrived via stretcher from ED to 1343. Alert and Oriented x4. Denies pain. Hydration provided. Oriented to room and call bell light. Denies further needs. Falls precautions in place. Patient educated to call staff for assistance. Patient acknowledges and verbalizes understanding. Will continue to monitor.

## 2015-03-13 NOTE — Progress Notes (Addendum)
Inpatient Diabetes Program Recommendations  AACE/ADA: New Consensus Statement on Inpatient Glycemic Control (2015)  Target Ranges:  Prepandial:   less than 140 mg/dL      Peak postprandial:   less than 180 mg/dL (1-2 hours)      Critically ill patients:  140 - 180 mg/dL   Review of Glycemic Control  Diabetes history: DM 2 (Sees Dr. Sharl MaKerr Endocrinologist) Outpatient Diabetes medications: NPH 10 units, 70/30 30 units QAM, 16 units QPM listed on med req pt takes higher dose can't remember at this time, Jardiance 25 Daily, Metformin 500 mg Daily (Dr. Sharl MaKerr d/c'd this med due to adjustments made on 70/30 dose)  Current orders for Inpatient glycemic control: 70/30 30 units QAM, 24 units QPM, Novolog moderate (medication adjusted this am)   Diabetes Consult due to compliance at home  Spoke with patient about diabetes and home regimen for diabetes control. Patient reports that he is followed by Dr. Sharl MaKerr, Endocrinologist for diabetes management. Patient reports that he is taking insulin and oral meds as prescribed except for Metformin and that he is on his last bottle with that one. Explained possible hypoglycemia with continued metformin dose when insulin was adjusted at last visit with Dr. Sharl MaKerr. Patient states that he checks his glucose 2-3 times per week and knows that Dr. Sharl MaKerr wants him to check it more.  Inquired about prior A1C and patient reports that he does not recall his last A1C value. Discussed glucose and A1C goals. Discussed importance of checking CBGs and maintaining good CBG control to prevent long-term and short-term complications.    Discussed impact of nutrition, exercise, stress, sickness, and medications on diabetes control.Patient reports going to several nutritional classes and knows what to eat. Patient reports being compliant with diet most of the time. He was craving juice when he came in due to his hypoglycemia episode. He mentions he has not had a low blood sugar in a very long  time. Monday and Tuesday this past week was the first time he saw he was in the 50's. He usually drinks unsweet tea, 2% milk, but 1-2 times a week he goes to [employer] and gets a large cherry coke.  Patient states that he does not eat fast food, no salt, eats veggies and greens. His only downfall is he likes rice and eats ice cream once in awhile. Discussed carbohydrates, carbohydrate goals per day and meal, along with portion sizes.   Encouraged patient to check his glucose and to keep a log book of glucose readings and insulin taken which he will need to take to doctor appointments. Patient verbalized understanding of information discussed and reports he knows what to do he is just "hard headed" he states that he has no further questions at this time related to diabetes.  Thanks, Dalton DeemShannon Aniylah Avans RN, MSN, [facility] Inpatient Diabetes Coordinator Team Pager [phone] (8a-5p)

## 2015-03-13 NOTE — Progress Notes (Signed)
Pt. Left via wheelchair with family. Pt. discharged to home. No respiratory distress noted.

## 2015-03-13 NOTE — H&P (Addendum)
[facility] History and Physical  [name] WUJ:[id number] DOB: 06/24/1955 DOA: 03/12/2015  Referring physician: EDP PCP: Dalton Labella, MD   Chief Complaint: Hypoglycemia   HPI: [name] is a 60 y.o. male with h/o DM2, he presents to the ED after an episode of AMS due to hypoglycemia.  BGL at home was 37 at the time that EMS was called when he was found down.  His AMS resolved with D50 and his BGL increased to 119.  Further IV glucose in ED has raised his BGL to 260s.  Work up in ED demonstrates UTI.  Review of Systems: Systems reviewed.  As above, otherwise negative  Past Medical History  Diagnosis Date  . Hyperlipidemia     mixed  . Chronic systolic heart failure (HCC)   . ICD (implantable cardiac defibrillator), dual, in situ     St. Jude for severe LVD EF 25% 2/07 explanted 2010. Medtronic Virtuoso II DR Dual-chamber cardiverter-defibrillator  with pocket revision, Dr. Graciela Husbands  . Tobacco abuse   . Ischemic cardiomyopathy     a. Echo (09/27/12): EF 20%, diffuse HK with periapical AK, no LV thrombus noted, restrictive physiology, trivial MR, mild LAE, RVSF mildly reduced, PASP 39  . Cerebrovascular disease   . Diabetes mellitus   . HTN (hypertension)   . PVD (peripheral vascular disease) (HCC)     a. ABI 09/2011 - R normal, L moderate - saw Dr. Kirke Corin - med rx.   . Carotid artery disease (HCC)     a. Dopp 09/2011: 60-79% RICA, 40-59% LICA.;  b.  Carotid US (7/14):  Bilateral 60-79% => f/u 6 mos  . Coronary artery disease     a. AWMI requiring IABP 2005 s/p Horizon study stent-LAD, staged BMS-Cx, DESx2-RCA. b. Last Last LHC (6/06):  EF 25%, pLAD stent 40-50, after stent 40, dLAD 20, pCFX 20, pOM1 70, pRCA 20, mRCA stents ok.=> med Rx;  c.  Myoview (7/14):  EF 26%, large ant, septal, inf and apical infarct, no ischemia.  Med Rx continued  . RIATA ICD Lead--on advisory recall    . Presence of permanent cardiac pacemaker   . AICD (automatic cardioverter/defibrillator)  present   . Heart murmur   . History of kidney stones    Past Surgical History  Procedure Laterality Date  . Medtronic virtuoso ii dr dual-chamber cardioverter-defibrillation with pocket revison      Dr. Sherryl Manges  . Femoral-popliteal bypass graft Right 11/07/2013    Procedure: Right FEMORAL-POPLITEAL ARTERY Bypass ;  Surgeon: Chuck Hint, MD;  Location: [facility] OR;  Service: Vascular;  Laterality: Right;  . Intraoperative arteriogram Right 11/07/2013    Procedure: INTRA OPERATIVE ARTERIOGRAM;  Surgeon: Chuck Hint, MD;  Location: [facility] OR;  Service: Vascular;  Laterality: Right;  . Implantable cardioverter defibrillator (icd) generator change N/A 12/16/2012    Procedure: ICD GENERATOR CHANGE;  Surgeon: [name], MD;  Location: [facility] CATH LAB;  Service: Cardiovascular;  Laterality: N/A;  . Abdominal aortagram N/A 11/06/2013    Procedure: ABDOMINAL AORTAGRAM;  Surgeon: Chuck Hint, MD;  Location: [facility] CATH LAB;  Service: Cardiovascular;  Laterality: N/A;  . Lower extremity angiogram Bilateral 11/06/2013    Procedure: LOWER EXTREMITY ANGIOGRAM;  Surgeon: Chuck Hint, MD;  Location: [facility] CATH LAB;  Service: Cardiovascular;  Laterality: Bilateral;  . Peripheral vascular catheterization N/A 09/25/2014    Procedure: Abdominal Aortogram;  Surgeon: Sherren Kerns, MD;  Location: [facility] INVASIVE CV LAB;  Service: Cardiovascular;  Laterality: N/A;  . Appendectomy  2000    ruptured  . Femoral-popliteal bypass graft Left 11/17/2014    Procedure: BYPASS GRAFT FEMORAL-POPLITEAL ARTERY USING GORE PROPATEN X 80CM VASCULAR GRAFT;  Surgeon: Chuck Hint, MD;  Location: [facility] OR;  Service: Vascular;  Laterality: Left;   Social History:  reports that he has been smoking Cigarettes.  He has a 33 pack-year smoking history. He has never used smokeless tobacco. He reports that he does not drink alcohol or use illicit drugs.  Allergies  Allergen Reactions  . Niacin Itching  and Other (See Comments)    Flushing   . Metformin And Related Nausea And Vomiting    *Only the extended release*    Family History  Problem Relation Age of Onset  . Diabetes Mother   . Coronary artery disease Father   . Heart disease Father     before age 58     Prior to Admission medications   Medication Sig Start Date End Date Taking? Authorizing Provider  acyclovir (ZOVIRAX) 800 MG tablet  02/01/15   Historical Provider, MD  aspirin 81 MG tablet Take 1 tablet (81 mg total) by mouth daily. 10/07/12   [name], MD  atorvastatin (LIPITOR) 80 MG tablet Take 1 tablet (80 mg total) by mouth every other day. Patient taking differently: Take 80 mg by mouth daily.  08/20/14   Lewayne Bunting, MD  carvedilol (COREG) 6.25 MG tablet Take 1 tablet (6.25 mg total) by mouth 2 (two) times daily with a meal. Patient taking differently: Take 3.125 mg by mouth 2 (two) times daily with a meal.  11/12/13   Richarda Overlie, MD  doxycycline (VIBRAMYCIN) 100 MG capsule Take 1 capsule (100 mg total) by mouth 2 (two) times daily. 02/03/15   Chuck Hint, MD  ezetimibe (ZETIA) 10 MG tablet Take one tablet by mouth once daily 01/28/15   Lewayne Bunting, MD  ibuprofen (ADVIL,MOTRIN) 200 MG tablet Take 200 mg by mouth every 6 (six) hours as needed for moderate pain.    Historical Provider, MD  insulin NPH Human (HUMULIN N,NOVOLIN N) 100 UNIT/ML injection Inject 10 Units into the skin daily.     Historical Provider, MD  insulin NPH-regular Human (NOVOLIN 70/30) (70-30) 100 UNIT/ML injection Inject 16-26 Units into the skin 2 (two) times daily with a meal. 26 units in the morning, 16 units in the evening    Historical Provider, MD  JARDIANCE 25 MG TABS tablet Take 25 mg by mouth daily. 03/02/14   Historical Provider, MD  metFORMIN (GLUCOPHAGE) 500 MG tablet Take 500 mg by mouth daily with breakfast.    Historical Provider, MD  potassium chloride (K-DUR,KLOR-CON) 10 MEQ tablet Take 1 tablet by mouth daily.  Take 1 tab daily 01/06/15   Historical Provider, MD  sacubitril-valsartan (ENTRESTO) 24-26 MG Take 1 tablet by mouth 2 (two) times daily. 02/04/15   Lewayne Bunting, MD  spironolactone (ALDACTONE) 25 MG tablet Take 0.5 tablets (12.5 mg total) by mouth daily. 03/02/15   Lewayne Bunting, MD  torsemide (DEMADEX) 20 MG tablet Take 3 tablets (60 mg total) by mouth once. Patient taking differently: Take 60 mg by mouth daily.  08/20/14   Lewayne Bunting, MD   Physical Exam: Filed Vitals:   03/12/15 2341 03/13/15 0042  BP: 114/75 110/83  Pulse: 105 98  Temp: 97.5 F (36.4 C)   Resp: 21 14    BP 110/83 mmHg  Pulse 98  Temp(Src)  97.5 F (36.4 C) (Oral)  Resp 14  SpO2 92%  General Appearance:    Alert, oriented, no distress, appears stated age  Head:    Normocephalic, atraumatic  Eyes:    PERRL, EOMI, sclera non-icteric        Nose:   Nares without drainage or epistaxis. Mucosa, turbinates normal  Throat:   Moist mucous membranes. Oropharynx without erythema or exudate.  Neck:   Supple. No carotid bruits.  No thyromegaly.  No lymphadenopathy.   Back:     No CVA tenderness, no spinal tenderness  Lungs:     Clear to auscultation bilaterally, without wheezes, rhonchi or rales  Chest wall:    No tenderness to palpitation  Heart:    Regular rate and rhythm without murmurs, gallops, rubs  Abdomen:     Soft, non-tender, nondistended, normal bowel sounds, no organomegaly  Genitalia:    deferred  Rectal:    deferred  Extremities:   No clubbing, cyanosis or edema.  Pulses:   2+ and symmetric all extremities  Skin:   Skin color, texture, turgor normal, no rashes or lesions  Lymph nodes:   Cervical, supraclavicular, and axillary nodes normal  Neurologic:   CNII-XII intact. Normal strength, sensation and reflexes      throughout    Labs on Admission:  Basic Metabolic Panel:  Recent Labs Lab 03/13/15 0021 03/13/15 0033  NA 134* 139  K 4.2 4.1  CL 97* 95*  CO2 27  --   GLUCOSE 198* 195*   BUN 24* 29*  CREATININE 0.96 0.80  CALCIUM 8.7*  --    Liver Function Tests:  Recent Labs Lab 03/13/15 0021  AST 36  ALT 27  ALKPHOS 161*  BILITOT 1.3*  PROT 8.4*  ALBUMIN 3.2*   No results for input(s): LIPASE, AMYLASE in the last 168 hours. No results for input(s): AMMONIA in the last 168 hours. CBC:  Recent Labs Lab 03/13/15 0021 03/13/15 0033  WBC 11.1*  --   NEUTROABS 10.2*  --   HGB 14.1 17.3*  HCT 45.6 51.0  MCV 84.8  --   PLT 167  --    Cardiac Enzymes:  Recent Labs Lab 03/13/15 0021  CKTOTAL 151    BNP (last 3 results) No results for input(s): PROBNP in the last 8760 hours. CBG:  Recent Labs Lab 03/12/15 2338 03/13/15 0021 03/13/15 0254  GLUCAP 119* 193* 265*    Radiological Exams on Admission: No results found.  EKG: Independently reviewed.  Assessment/Plan Principal Problem:   Hypoglycemia Active Problems:   Hypertension   Type 2 diabetes mellitus (HCC)   UTI (lower urinary tract infection)   Chronic systolic CHF (congestive heart failure) (HCC)   1. Hypoglycemia - resolved 1. Diabetes coordinator 2. Believe that there is significant non-adherence issues contributing to his trouble with BGL control: 1. He is currently asking for "1 gallon of apple juice" in the room and if he cant have that "how about some orange juice then", when I told him I was concerned his BGL would go through the roof, his response was "who cares" 2. When he started on insulin, he continued taking metformin despite supposed to having stopped this because he "didnt want to waste the bottle" 3. He admits that he checks his sugar at home "once in a blue moon" 4. Additionally he hasnt taken his doxycycline that he is supposed to be on chronically for leg infection "for the past week" and also hasnt taken acyclovir for the  singles he had last week (although singles are resolved now) 2. DM2 - 1. Holding home meds including jardiance with UTI 2. Putting patient on  levemir qhs and moderate dose SSI 3. UTI - Rocephin, cultures pending 4. Chronic systolic CHF - continue all home meds and diuretics 5. HTN - continue home meds   Code Status: Full  Family Communication: Wife at bedside Disposition Plan: Admit to obs   Time spent: 70 min  Fergie Sherbert M. [facility] Pager [phone]  If 7AM-7PM, please contact the day team taking care of the patient [email or web address] Password [email or web address] 03/13/2015, 3:57 AM

## 2015-03-13 NOTE — Progress Notes (Signed)
Discharge teaching completed with teach back. Pt. spoke to Diabetic Educator this morning. Discharge instructions given and reviewed with pt. Pt. to follow up and has follow up appointments scheduled. Pt. also states he will check his blood sugar more frequently. Pt. to pick up a prescription for Levofloxacin at [facility]. Answered questions.

## 2015-03-13 NOTE — Discharge Summary (Signed)
Physician Discharge Summary  [name] ZOX:[id number] DOB: 08-10-1955 DOA: 03/12/2015  PCP: Dalton Labella, MD  Admit date: 03/12/2015 Discharge date: 03/13/2015  Time spent: 50 minutes  Recommendations for Outpatient Follow-up:  1. Have asked him to check sugars and keep a log- needs to discuss hypoglycemic episodes with endocrinologist this week  Discharge Condition: stable    Discharge Diagnoses:  Principal Problem:   Hypoglycemia Active Problems:   Hypertension   Type 2 diabetes mellitus (HCC)   UTI (lower urinary tract infection)   Chronic systolic CHF (congestive heart failure) (HCC)   History of present illness:  60 year old male with insulin-dependent diabetes mellitus, hypertension and chronic systolic heart failure who presents to the hospital for hypoglycemic episode. He states that he had eaten breakfast and taken his insulin and conduct the bed when he woke up in the evening he was hypoglycemic with a sugar of 35. His mother called EMS.  Hospital Course:  Diabetes mellitus type 2/hypoglycemia -Patient does not check his sugars regularly as she has been advised to because he states that the strips are very expensive -His insulin was increased recently and he has it written down at home but is unable to tell me what it was increased to -Sugars have improved--He will be discontinuing Jardiance and as he has had a second urinary tract infection in the past 3 months- this may prevent further hypoglycemic episodes  - patient does not want me to adjust his insulin dosages-I have asked him to check his sugars 3 times a day with meals and at bedtime and also to call his endocrinologist this coming Monday to discuss his 3 hypoglycemic episodes this week  UTI - He states he had a UTI in January as well-we'll give him Levaquin on discharge to complete a seven-day course   Chronic systolic heart failure, hypertension, hyperlipidemia -have not needed to adjust any of his  medications during his short hospital stay(he was admitted last night)  Discharge Exam: Filed Weights   03/13/15 0504  Weight: 98.204 kg (216 lb 8 oz)   Filed Vitals:   03/13/15 0516 03/13/15 0738  BP: 101/61 106/73  Pulse: 95 94  Temp: 97.9 F (36.6 C)   Resp: 19     General: AAO x 3, no distress Cardiovascular: RRR, no murmurs  Respiratory: clear to auscultation bilaterally GI: soft, non-tender, non-distended, bowel sound positive  Discharge Instructions You were cared for by a hospitalist during your hospital stay. If you have any questions about your discharge medications or the care you received while you were in the hospital after you are discharged, you can call the unit and asked to speak with the hospitalist on call if the hospitalist that took care of you is not available. Once you are discharged, your primary care physician will handle any further medical issues. Please note that NO REFILLS for any discharge medications will be authorized once you are discharged, as it is imperative that you return to your primary care physician (or establish a relationship with a primary care physician if you do not have one) for your aftercare needs so that they can reassess your need for medications and monitor your lab values.  Discharge Instructions    Discharge instructions    Complete by:  As directed   Low carb, low sodium, low fat diet     Increase activity slowly    Complete by:  As directed             Medication List  STOP taking these medications        JARDIANCE 25 MG Tabs tablet  Generic drug:  empagliflozin      TAKE these medications        aspirin 81 MG tablet  Take 1 tablet (81 mg total) by mouth daily.     atorvastatin 80 MG tablet  Commonly known as:  LIPITOR  Take 1 tablet (80 mg total) by mouth every other day.     carvedilol 6.25 MG tablet  Commonly known as:  COREG  Take 1 tablet (6.25 mg total) by mouth 2 (two) times daily with a meal.      ezetimibe 10 MG tablet  Commonly known as:  ZETIA  Take one tablet by mouth once daily     ibuprofen 200 MG tablet  Commonly known as:  ADVIL,MOTRIN  Take 200 mg by mouth every 6 (six) hours as needed for moderate pain.     insulin NPH Human 100 UNIT/ML injection  Commonly known as:  HUMULIN N,NOVOLIN N  Inject 10 Units into the skin daily.     insulin NPH-regular Human (70-30) 100 UNIT/ML injection  Commonly known as:  NOVOLIN 70/30  Inject 16-40 Units into the skin 2 (two) times daily with a meal. 26 units in the morning, 16 units in the evening     levofloxacin 250 MG tablet  Commonly known as:  LEVAQUIN  Take 1 tablet (250 mg total) by mouth daily.     metFORMIN 500 MG tablet  Commonly known as:  GLUCOPHAGE  Take 500 mg by mouth daily with breakfast.     potassium chloride 10 MEQ tablet  Commonly known as:  K-DUR,KLOR-CON  Take 1 tablet by mouth daily.     sacubitril-valsartan 24-26 MG  Commonly known as:  ENTRESTO  Take 1 tablet by mouth 2 (two) times daily.     spironolactone 25 MG tablet  Commonly known as:  ALDACTONE  Take 0.5 tablets (12.5 mg total) by mouth daily.     torsemide 20 MG tablet  Commonly known as:  DEMADEX  Take 3 tablets (60 mg total) by mouth once.       Allergies  Allergen Reactions  . Niacin Itching and Other (See Comments)    Flushing   . Metformin And Related Nausea And Vomiting    *Only the extended release*      The results of significant diagnostics from this hospitalization (including imaging, microbiology, ancillary and laboratory) are listed below for reference.    Significant Diagnostic Studies: No results found.  Microbiology: No results found for this or any previous visit (from the past 240 hour(s)).   Labs: Basic Metabolic Panel:  Recent Labs Lab 03/13/15 0021 03/13/15 0033  NA 134* 139  K 4.2 4.1  CL 97* 95*  CO2 27  --   GLUCOSE 198* 195*  BUN 24* 29*  CREATININE 0.96 0.80  CALCIUM 8.7*  --    Liver  Function Tests:  Recent Labs Lab 03/13/15 0021  AST 36  ALT 27  ALKPHOS 161*  BILITOT 1.3*  PROT 8.4*  ALBUMIN 3.2*   No results for input(s): LIPASE, AMYLASE in the last 168 hours. No results for input(s): AMMONIA in the last 168 hours. CBC:  Recent Labs Lab 03/13/15 0021 03/13/15 0033  WBC 11.1*  --   NEUTROABS 10.2*  --   HGB 14.1 17.3*  HCT 45.6 51.0  MCV 84.8  --   PLT 167  --    Cardiac  Enzymes:  Recent Labs Lab 03/13/15 0021  CKTOTAL 151   BNP: BNP (last 3 results)  Recent Labs  12/30/14 0830  BNP 862.8*    ProBNP (last 3 results) No results for input(s): PROBNP in the last 8760 hours.  CBG:  Recent Labs Lab 03/12/15 2338 03/13/15 0021 03/13/15 0254 03/13/15 0742 03/13/15 1131  GLUCAP 119* 193* 265* 171* 213*       SignedCalvert Cantor, MD [facility] 03/13/2015, 12:12 PM

## 2015-03-15 ENCOUNTER — Other Ambulatory Visit: Payer: Self-pay | Admitting: *Deleted

## 2015-03-15 ENCOUNTER — Other Ambulatory Visit: Payer: Self-pay | Admitting: Cardiology

## 2015-03-15 NOTE — Telephone Encounter (Signed)
REFILL 

## 2015-03-19 ENCOUNTER — Other Ambulatory Visit: Payer: Self-pay | Admitting: Cardiology

## 2015-04-01 NOTE — Progress Notes (Signed)
HPI: FU CAD and CHF. S/p ant MI in 2005 req IABP tx with Horizon study stent to the LAD followed by staged BMS to the CFX and DES x 2 to RCA, Ischemic CM, systolic CHF, s/p AICD, HTN, DM2, HL, carotid stenosis, PAD. Last LHC (6/06): EF 25%, pLAD stent 40-50, after stent 40, dLAD 20, pCFX 20, pOM1 70, pRCA 20, mRCA stents ok. Abdominal US (2/11): No AAA. Myoview (7/14): EF 26%, large ant, septal, inf and apical infarct, no ischemia. Med Rx continued. Carotid Dopplers August 2016 showed 60-79% right and 40-59% left stenosis. Follow-up 12 months. Echocardiogram January 2017 showed ejection fraction 20-25%, mild mitral regurgitation, mild left atrial enlargement, moderate right ventricular enlargement and mild right atrial enlargement; moderate TR. Since last seen, He has some dyspnea on exertion but no orthopnea, PND, pedal edema, exertional chest pain or syncope.  Current Outpatient Prescriptions  Medication Sig Dispense Refill  . aspirin 81 MG tablet Take 1 tablet (81 mg total) by mouth daily. 30 tablet 3  . atorvastatin (LIPITOR) 80 MG tablet Take 1 tablet (80 mg total) by mouth every other day. 30 tablet 11  . carvedilol (COREG) 6.25 MG tablet Take 1 tablet (6.25 mg total) by mouth 2 (two) times daily with a meal. (Patient taking differently: Take 6.25 mg by mouth daily. ) 60 tablet 2  . ezetimibe (ZETIA) 10 MG tablet TAKE ONE TABLET BY MOUTH ONCE DAILY 30 tablet 0  . ibuprofen (ADVIL,MOTRIN) 200 MG tablet Take 200 mg by mouth every 6 (six) hours as needed for moderate pain.    Marland Kitchen insulin NPH Human (HUMULIN N,NOVOLIN N) 100 UNIT/ML injection Inject 10 Units into the skin daily.     . insulin NPH-regular Human (NOVOLIN 70/30) (70-30) 100 UNIT/ML injection Inject 16-40 Units into the skin 2 (two) times daily with a meal. 32 units in the morning, 16 units in the evening    . potassium chloride (K-DUR,KLOR-CON) 10 MEQ tablet Take 1 tablet by mouth daily.     . sacubitril-valsartan (ENTRESTO) 24-26  MG Take 1 tablet by mouth 2 (two) times daily. 60 tablet 6  . spironolactone (ALDACTONE) 25 MG tablet Take 0.5 tablets (12.5 mg total) by mouth daily. 15 tablet 1  . torsemide (DEMADEX) 20 MG tablet Take 3 tablets (60 mg total) by mouth once. (Patient taking differently: Take 60 mg by mouth daily. ) 90 tablet 11   No current facility-administered medications for this visit.     Past Medical History  Diagnosis Date  . Hyperlipidemia     mixed  . Chronic systolic heart failure (HCC)   . ICD (implantable cardiac defibrillator), dual, in situ     St. Jude for severe LVD EF 25% 2/07 explanted 2010. Medtronic Virtuoso II DR Dual-chamber cardiverter-defibrillator  with pocket revision, Dr. Graciela Husbands  . Tobacco abuse   . Ischemic cardiomyopathy     a. Echo (09/27/12): EF 20%, diffuse HK with periapical AK, no LV thrombus noted, restrictive physiology, trivial MR, mild LAE, RVSF mildly reduced, PASP 39  . Cerebrovascular disease   . Diabetes mellitus   . HTN (hypertension)   . PVD (peripheral vascular disease) (HCC)     a. ABI 09/2011 - R normal, L moderate - saw Dr. Kirke Corin - med rx.   . Carotid artery disease (HCC)     a. Dopp 09/2011: 60-79% RICA, 40-59% LICA.;  b.  Carotid US (7/14):  Bilateral 60-79% => f/u 6 mos  . Coronary artery disease  a. AWMI requiring IABP 2005 s/p Horizon study stent-LAD, staged BMS-Cx, DESx2-RCA. b. Last Last LHC (6/06):  EF 25%, pLAD stent 40-50, after stent 40, dLAD 20, pCFX 20, pOM1 70, pRCA 20, mRCA stents ok.=> med Rx;  c.  Myoview (7/14):  EF 26%, large ant, septal, inf and apical infarct, no ischemia.  Med Rx continued  . RIATA ICD Lead--on advisory recall    . Presence of permanent cardiac pacemaker   . AICD (automatic cardioverter/defibrillator) present   . Heart murmur   . History of kidney stones     Past Surgical History  Procedure Laterality Date  . Medtronic virtuoso ii dr dual-chamber cardioverter-defibrillation with pocket revison      Dr. Sherryl MangesSteven  Klein  . Femoral-popliteal bypass graft Right 11/07/2013    Procedure: Right FEMORAL-POPLITEAL ARTERY Bypass ;  Surgeon: Chuck Hinthristopher S Dickson, MD;  [facility];  Service: Vascular;  Laterality: Right;  . Intraoperative arteriogram Right 11/07/2013    Procedure: INTRA OPERATIVE ARTERIOGRAM;  Surgeon: Chuck Hinthristopher S Dickson, MD;  [facility];  Service: Vascular;  Laterality: Right;  . Implantable cardioverter defibrillator (icd) generator change N/A 12/16/2012    Procedure: ICD GENERATOR CHANGE;  Surgeon: Duke SalviaSteven C Klein, MD;  [facility];  Service: Cardiovascular;  Laterality: N/A;  . Abdominal aortagram N/A 11/06/2013    Procedure: ABDOMINAL AORTAGRAM;  Surgeon: Chuck Hinthristopher S Dickson, MD;  [facility];  Service: Cardiovascular;  Laterality: N/A;  . Lower extremity angiogram Bilateral 11/06/2013    Procedure: LOWER EXTREMITY ANGIOGRAM;  Surgeon: Chuck Hinthristopher S Dickson, MD;  [facility];  Service: Cardiovascular;  Laterality: Bilateral;  . Peripheral vascular catheterization N/A 09/25/2014    Procedure: Abdominal Aortogram;  Surgeon: Sherren Kernsharles E Fields, MD;  [facility];  Service: Cardiovascular;  Laterality: N/A;  . Appendectomy  2000    ruptured  . Femoral-popliteal bypass graft Left 11/17/2014    Procedure: BYPASS GRAFT FEMORAL-POPLITEAL ARTERY USING GORE PROPATEN 6MM X 80CM VASCULAR GRAFT;  Surgeon: Chuck Hinthristopher S Dickson, MD;  [facility];  Service: Vascular;  Laterality: Left;    Social History   Social History  . Marital Status: Single    Spouse Name: N/A  . Number of Children: N/A  . Years of Education: N/A   Occupational History  . 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 Smoking status: Current Some Day Smoker -- 1.00 packs/day for 33 years    Types: Cigarettes  . Smokeless tobacco: Never Used     Comment: smoker for 33 years  . Alcohol Use: No  . Drug Use: No     Comment: former  Cocain, Acid, Marijuana - 25 years ago  . Sexual Activity: No   Other Topics Concern  . Not on file   Social History Narrative   Divorced.    Family History  Problem Relation Age of Onset  . Diabetes Mother   . Coronary artery disease Father   . Heart disease Father     before age 61    ROS: no fevers or chills, productive cough, hemoptysis, dysphasia, odynophagia, melena, hematochezia, dysuria, hematuria, rash, seizure activity, orthopnea, PND, pedal edema, claudication. Remaining systems are negative.  Physical Exam: Well-developed well-nourished in no acute distress.  Skin is warm and dry.  HEENT is normal.  Neck is supple.  Chest is clear to auscultation with normal expansion.  Cardiovascular exam is regular rate and rhythm.  Abdominal exam nontender or distended. No masses palpated. Extremities show no edema. neuro grossly intact

## 2015-04-05 ENCOUNTER — Encounter: Payer: Self-pay | Admitting: Cardiology

## 2015-04-05 ENCOUNTER — Ambulatory Visit (INDEPENDENT_AMBULATORY_CARE_PROVIDER_SITE_OTHER): Payer: BLUE CROSS/BLUE SHIELD | Admitting: Cardiology

## 2015-04-05 ENCOUNTER — Other Ambulatory Visit: Payer: Self-pay | Admitting: *Deleted

## 2015-04-05 VITALS — BP 92/54 | HR 70 | Ht 72.0 in | Wt 199.5 lb

## 2015-04-05 DIAGNOSIS — F17200 Nicotine dependence, unspecified, uncomplicated: Secondary | ICD-10-CM

## 2015-04-05 DIAGNOSIS — Z95810 Presence of automatic (implantable) cardiac defibrillator: Secondary | ICD-10-CM

## 2015-04-05 DIAGNOSIS — I739 Peripheral vascular disease, unspecified: Secondary | ICD-10-CM

## 2015-04-05 DIAGNOSIS — I6529 Occlusion and stenosis of unspecified carotid artery: Secondary | ICD-10-CM | POA: Diagnosis not present

## 2015-04-05 DIAGNOSIS — I2510 Atherosclerotic heart disease of native coronary artery without angina pectoris: Secondary | ICD-10-CM

## 2015-04-05 DIAGNOSIS — Z794 Long term (current) use of insulin: Secondary | ICD-10-CM

## 2015-04-05 DIAGNOSIS — I5022 Chronic systolic (congestive) heart failure: Secondary | ICD-10-CM | POA: Diagnosis not present

## 2015-04-05 DIAGNOSIS — E118 Type 2 diabetes mellitus with unspecified complications: Secondary | ICD-10-CM

## 2015-04-05 DIAGNOSIS — E785 Hyperlipidemia, unspecified: Secondary | ICD-10-CM

## 2015-04-05 DIAGNOSIS — I10 Essential (primary) hypertension: Secondary | ICD-10-CM

## 2015-04-05 LAB — BASIC METABOLIC PANEL
BUN: 21 mg/dL (ref 7–25)
CALCIUM: 9 mg/dL (ref 8.6–10.3)
CO2: 27 mmol/L (ref 20–31)
Chloride: 98 mmol/L (ref 98–110)
Creat: 0.77 mg/dL (ref 0.70–1.25)
GLUCOSE: 171 mg/dL — AB (ref 65–99)
Potassium: 4 mmol/L (ref 3.5–5.3)
Sodium: 135 mmol/L (ref 135–146)

## 2015-04-05 NOTE — Assessment & Plan Note (Addendum)
Patient counseled on discontinuing. 

## 2015-04-05 NOTE — Assessment & Plan Note (Signed)
Continue entresto and coreg. His blood pressure is borderline. We will need to follow this and adjust regimen if needed.

## 2015-04-05 NOTE — Assessment & Plan Note (Signed)
Followed by electrophysiology. 

## 2015-04-05 NOTE — Assessment & Plan Note (Signed)
Continue statin. 

## 2015-04-05 NOTE — Patient Instructions (Signed)
Medication Instructions:   NO CHANGE  Labwork:  Your physician recommends that you HAVE LAB WORK TODAY  Follow-Up:  Your physician wants you to follow-up in: 6 MONTHS WITH DR CRENSHAW You will receive a reminder letter in the mail two months in advance. If you don't receive a letter, please call our office to schedule the follow-up appointment.      

## 2015-04-05 NOTE — Assessment & Plan Note (Signed)
Follow-up carotid Dopplers August 2017. 

## 2015-04-05 NOTE — Assessment & Plan Note (Signed)
Patient followed by vascular surgery. Continue aspirin and statin.

## 2015-04-05 NOTE — Assessment & Plan Note (Signed)
Patient appears to be euvolemic on examination.Continue present medications. Check potassium and renal function.

## 2015-04-05 NOTE — Assessment & Plan Note (Signed)
Continue aspirin and statin. 

## 2015-04-05 NOTE — Assessment & Plan Note (Signed)
Blood pressure borderline. We will follow And adjust regimen if needed.

## 2015-04-06 ENCOUNTER — Encounter: Payer: Self-pay | Admitting: Vascular Surgery

## 2015-04-07 ENCOUNTER — Ambulatory Visit (INDEPENDENT_AMBULATORY_CARE_PROVIDER_SITE_OTHER): Payer: BLUE CROSS/BLUE SHIELD | Admitting: Vascular Surgery

## 2015-04-07 ENCOUNTER — Encounter: Payer: Self-pay | Admitting: Vascular Surgery

## 2015-04-07 VITALS — BP 112/70 | HR 85 | Temp 97.2°F | Ht 72.0 in | Wt 201.4 lb

## 2015-04-07 DIAGNOSIS — I7025 Atherosclerosis of native arteries of other extremities with ulceration: Secondary | ICD-10-CM

## 2015-04-07 DIAGNOSIS — Z48812 Encounter for surgical aftercare following surgery on the circulatory system: Secondary | ICD-10-CM

## 2015-04-07 DIAGNOSIS — Z0279 Encounter for issue of other medical certificate: Secondary | ICD-10-CM

## 2015-04-07 MED ORDER — CARVEDILOL 6.25 MG PO TABS: 6.2500 mg | ORAL_TABLET | Freq: Two times a day (BID) | ORAL | Status: DC

## 2015-04-07 NOTE — Progress Notes (Signed)
Patient name: [name] MRN: [id number] DOB: 13-Jun-1955 Sex: male  REASON FOR VISIT: Follow up after left femoral to below knee popliteal artery bypass  HPI: Dalton RainwaterLarry D Moncrieffe is a 60 y.o. male he comes in for a 2 month follow up visit. He had presented with multiple venous stasis ulcers of the left leg and had severe infrainguinal arterial occlusive disease. He has single vessel runoff via the peroneal artery. His great saphenous vein was not adequate at the time of surgery and therefore he had a prosthetic bypass. Graft duplex on 02/01/2015 shows that the graft is patent.  When I saw him last he had some cellulitis in the catheter and put him back on doxycycline. We discussed the importance of intermittent leg elevation and he comes in for a 2 month follow up visit.  Since I saw him last, he did have a real problem with hypoglycemia and had to be hospitalized. Otherwise she's done fairly well. He is not quite ready to go back to work. He does continue to smoke about 10 cigarettes a day. He denies significant rest pain in his feet and states that the wounds on his legs have continued to slowly heal. He does not describe significant claudication in the left leg although his activity is fairly limited.    Current Outpatient Prescriptions  Medication Sig Dispense Refill  . aspirin 81 MG tablet Take 1 tablet (81 mg total) by mouth daily. 30 tablet 3  . atorvastatin (LIPITOR) 80 MG tablet Take 1 tablet (80 mg total) by mouth every other day. 30 tablet 11  . carvedilol (COREG) 6.25 MG tablet Take 1 tablet (6.25 mg total) by mouth 2 (two) times daily with a meal. (Patient taking differently: Take 6.25 mg by mouth daily. ) 60 tablet 2  . ezetimibe (ZETIA) 10 MG tablet TAKE ONE TABLET BY MOUTH ONCE DAILY 30 tablet 0  . ibuprofen (ADVIL,MOTRIN) 200 MG tablet Take 200 mg by mouth every 6 (six) hours as needed for moderate pain.    Marland Kitchen. insulin NPH Human (HUMULIN N,NOVOLIN N) 100 UNIT/ML injection Inject 10  Units into the skin daily.     . insulin NPH-regular Human (NOVOLIN 70/30) (70-30) 100 UNIT/ML injection Inject 16-40 Units into the skin 2 (two) times daily with a meal. 32 units in the morning, 16 units in the evening    . potassium chloride (K-DUR,KLOR-CON) 10 MEQ tablet Take 1 tablet by mouth daily.     . sacubitril-valsartan (ENTRESTO) 24-26 MG Take 1 tablet by mouth 2 (two) times daily. 60 tablet 6  . spironolactone (ALDACTONE) 25 MG tablet Take 0.5 tablets (12.5 mg total) by mouth daily. 15 tablet 1  . torsemide (DEMADEX) 20 MG tablet Take 3 tablets (60 mg total) by mouth once. (Patient taking differently: Take 60 mg by mouth daily. ) 90 tablet 11   No current facility-administered medications for this visit.    REVIEW OF SYSTEMS:  [X]  denotes positive finding, [ ]  denotes negative finding Cardiac  Comments:  Chest pain or chest pressure:    Shortness of breath upon exertion: X   Short of breath when lying flat:    Irregular heart rhythm:    Constitutional    Fever or chills:      PHYSICAL EXAM: Filed Vitals:   04/07/15 0842  BP: 112/70  Pulse: 85  Temp: 97.2 F (36.2 C)  TempSrc: Oral  Height: 6' (1.829 m)  Weight: 201 lb 6.4 oz (91.354 kg)  SpO2: 100%  GENERAL: The patient is a well-nourished male, in no acute distress. The vital signs are documented above. CARDIOVASCULAR: There is a regular rate and rhythm. PULMONARY: There is good air exchange bilaterally without wheezing or rales. VASCULAR: I do not detect carotid bruits. He has a biphasic peroneal signal on the left and a monophasic dorsalis pedis and posterior tibial signal. EXTREMITIES/SKIN: He has a small eschar just above his left medial malleolus and also on the pretibial area which continued to gradually improve.  MEDICAL ISSUES:  STATUS POST LEFT FEMORAL TO BELOW KNEE POPLITEAL ARTERY BYPASS WITH A 6 MM PTFE GRAFT: His bypass graft is patent with a biphasic peroneal signal with the Doppler. We have  discussed the importance of tobacco cessation. I have encouraged him to stay as active as possible. I've given him a return to work slip for 05/03/2015. I will see him back in 6 months with a graft duplex and ABIs. Enos call sooner if he has problems.  Waverly [facility] [place] Beeper: [phone]

## 2015-04-07 NOTE — Telephone Encounter (Signed)
Rx(s) sent to pharmacy electronically.  

## 2015-04-16 ENCOUNTER — Ambulatory Visit (INDEPENDENT_AMBULATORY_CARE_PROVIDER_SITE_OTHER): Payer: BLUE CROSS/BLUE SHIELD | Admitting: Internal Medicine

## 2015-04-16 ENCOUNTER — Encounter: Payer: Self-pay | Admitting: Internal Medicine

## 2015-04-16 VITALS — BP 108/68 | HR 86 | Ht 72.0 in | Wt 221.2 lb

## 2015-04-16 DIAGNOSIS — I5022 Chronic systolic (congestive) heart failure: Secondary | ICD-10-CM | POA: Diagnosis not present

## 2015-04-16 DIAGNOSIS — I255 Ischemic cardiomyopathy: Secondary | ICD-10-CM | POA: Diagnosis not present

## 2015-04-16 DIAGNOSIS — Z95810 Presence of automatic (implantable) cardiac defibrillator: Secondary | ICD-10-CM

## 2015-04-16 LAB — CUP PACEART INCLINIC DEVICE CHECK
Battery Remaining Longevity: 81.6
Brady Statistic RV Percent Paced: 0 %
Date Time Interrogation Session: 20170414125843
HighPow Impedance: 52.0266
Implantable Lead Implant Date: 20070207
Implantable Lead Location: 753860
Implantable Lead Model: 7001
Lead Channel Impedance Value: 462.5 Ohm
Lead Channel Pacing Threshold Amplitude: 0.75 V
Lead Channel Pacing Threshold Pulse Width: 0.5 ms
Lead Channel Sensing Intrinsic Amplitude: 11.7 mV
Lead Channel Setting Pacing Amplitude: 2.5 V
Lead Channel Setting Pacing Pulse Width: 0.5 ms
Lead Channel Setting Sensing Sensitivity: 0.5 mV
Pulse Gen Serial Number: 7155171

## 2015-04-16 NOTE — Patient Instructions (Signed)
Medication Instructions: - Your physician recommends that you continue on your current medications as directed. Please refer to the Current Medication list given to you today.  Labwork: - none  Procedures/Testing: - none  Follow-Up: - Your physician wants you to follow-up in: 3 months with the device clinic & 1 year with Dr. Graciela HusbandsKlein. You will receive a reminder letter in the mail two months in advance. If you don't receive a letter, please call our office to schedule the follow-up appointment.  Any Additional Special Instructions Will Be Listed Below (If Applicable).     If you need a refill on your cardiac medications before your next appointment, please call your pharmacy.

## 2015-04-16 NOTE — Progress Notes (Signed)
Patient Care Team: Sigmund Hazel, MD as PCP - General (Family Medicine)   HPI  [name] is a 61 y.o. male Seen in followup for ischemic heart disease with prior stenting of LAD/RCA and LCx with last Cath 2006. He has a previously implanted ICD for which  he underwent generator replacement 12/14 .  He has known peripheral vascular disease with ABIs in remote past and carotid Dopplers in our office a year ago with 60-79% bilaterally.   He was 11/15  hosp for chf  EF 25 %;Last myoview was in 07/2012 with large, severe, fixed anterior, septal, inferior and apical infarct; no ischemia, EF 26%     He was hospitalized in January for congestive heart failure.  Echocardiogram EF was 20-25%. He was diuresed. He is in following up closely with Dr. Marsa Aris. Seen a couple weeks ago. Notes reviewed. He is adjusting his diuretics as needed   Past Medical History  Diagnosis Date  . Hyperlipidemia     mixed  . Chronic systolic heart failure (HCC)   . ICD (implantable cardiac defibrillator), dual, in situ     St. Jude for severe LVD EF 25% 2/07 explanted 2010. Medtronic Virtuoso II DR Dual-chamber cardiverter-defibrillator  with pocket revision, Dr. Graciela Husbands  . Tobacco abuse   . Ischemic cardiomyopathy     a. Echo (09/27/12): EF 20%, diffuse HK with periapical AK, no LV thrombus noted, restrictive physiology, trivial MR, mild LAE, RVSF mildly reduced, PASP 39  . Cerebrovascular disease   . Diabetes mellitus   . HTN (hypertension)   . PVD (peripheral vascular disease) (HCC)     a. ABI 09/2011 - R normal, L moderate - saw Dr. Kirke Corin - med rx.   . Carotid artery disease (HCC)     a. Dopp 09/2011: 60-79% RICA, 40-59% LICA.;  b.  Carotid US (7/14):  Bilateral 60-79% => f/u 6 mos  . Coronary artery disease     a. AWMI requiring IABP 2005 s/p Horizon study stent-LAD, staged BMS-Cx, DESx2-RCA. b. Last Last LHC (6/06):  EF 25%, pLAD stent 40-50, after stent 40, dLAD 20, pCFX 20, pOM1 70, pRCA 20, mRCA stents ok.=> med  Rx;  c.  Myoview (7/14):  EF 26%, large ant, septal, inf and apical infarct, no ischemia.  Med Rx continued  . RIATA ICD Lead--on advisory recall    . Presence of permanent cardiac pacemaker   . AICD (automatic cardioverter/defibrillator) present   . Heart murmur   . History of kidney stones     Past Surgical History  Procedure Laterality Date  . Medtronic virtuoso ii dr dual-chamber cardioverter-defibrillation with pocket revison      Dr. Sherryl Manges  . Femoral-popliteal bypass graft Right 11/07/2013    Procedure: Right FEMORAL-POPLITEAL ARTERY Bypass ;  Surgeon: Chuck Hint, MD;  Location: [facility] OR;  Service: Vascular;  Laterality: Right;  . Intraoperative arteriogram Right 11/07/2013    Procedure: INTRA OPERATIVE ARTERIOGRAM;  Surgeon: Chuck Hint, MD;  Location: [facility] OR;  Service: Vascular;  Laterality: Right;  . Implantable cardioverter defibrillator (icd) generator change N/A 12/16/2012    Procedure: ICD GENERATOR CHANGE;  Surgeon: [name], MD;  [facility] LAB;  Service: Cardiovascular;  Laterality: N/A;  . Abdominal aortagram N/A 11/06/2013    Procedure: ABDOMINAL AORTAGRAM;  Surgeon: Chuck Hint, MD;  Location: [facility] C[facility] LAB;  Service: Cardiovascular;  Laterality: N/A;  . Lower extremity angiogram Bilateral 11/06/2013    Procedure: LOWER EXTREMITY ANGIOGRAM;  Surgeon:  Chuck Hint, MD;  Location: [facility] CATH LAB;  Service: Cardiovascular;  Laterality: Bilateral;  . Peripheral vascular catheterization N/A 09/25/2014    Procedure: Abdominal Aortogram;  Surgeon: Sherren Kerns, MD;  Location: [facility] INVASIVE CV LAB;  Service: Cardiovascular;  Laterality: N/A;  . Appendectomy  2000    ruptured  . Femoral-popliteal bypass graft Left 11/17/2014    Procedure: BYPASS GRAFT FEMORAL-POPLITEAL ARTERY USING GORE PROPATEN X 80CM VASCULAR GRAFT;  Surgeon: Chuck Hint, MD;  Location: [facility] OR;  Service: Vascular;  Laterality: Left;    Current  Outpatient Prescriptions  Medication Sig Dispense Refill  . aspirin 81 MG tablet Take 1 tablet (81 mg total) by mouth daily. 30 tablet 3  . atorvastatin (LIPITOR) 80 MG tablet Take 1 tablet (80 mg total) by mouth every other day. 30 tablet 11  . carvedilol (COREG) 6.25 MG tablet Take 1 tablet (6.25 mg total) by mouth 2 (two) times daily with a meal. 60 tablet 11  . ezetimibe (ZETIA) 10 MG tablet TAKE ONE TABLET BY MOUTH ONCE DAILY 30 tablet 0  . ibuprofen (ADVIL,MOTRIN) 200 MG tablet Take 200 mg by mouth every 6 (six) hours as needed for moderate pain.    Marland Kitchen insulin NPH Human (HUMULIN N,NOVOLIN N) 100 UNIT/ML injection Inject 10 Units into the skin daily.     . insulin NPH-regular Human (NOVOLIN 70/30) (70-30) 100 UNIT/ML injection Inject 16-40 Units into the skin 2 (two) times daily with a meal. 32 units in the morning, 16 units in the evening    . JARDIANCE 25 MG TABS tablet Take 1 tablet by mouth daily.    . potassium chloride (K-DUR,KLOR-CON) 10 MEQ tablet Take 1 tablet by mouth daily.     . sacubitril-valsartan (ENTRESTO) 24-26 MG Take 1 tablet by mouth 2 (two) times daily. 60 tablet 6  . spironolactone (ALDACTONE) 25 MG tablet Take 0.5 tablets (12.5 mg total) by mouth daily. 15 tablet 1  . torsemide (DEMADEX) 20 MG tablet Take 20 mg by mouth 3 (three) times daily.     No current facility-administered medications for this visit.    Allergies  Allergen Reactions  . Niacin Itching and Other (See Comments)    Flushing   . Metformin And Related Nausea And Vomiting    *Only the extended release*    Review of Systems negative except from HPI and PMH  Physical Exam BP 108/68 mmHg  Pulse 86  Ht 6' (1.829 m)  Wt 221 lb 3.2 oz (100.336 kg)  BMI 29.99 kg/m2  SpO2 99% Well developed and nourished in no acute distress HENT normal Neck supple with 7-8 cm  Clear Regular rate and rhythm,+s4 with 2/6 murmur Abd-soft with active BS No Clubbing cyanosis 2+  edema Skin-warm and dry   Incompletely healed ulcer right foot A & Oriented  Grossly normal sensory and motor function   ECG 5 January sinus rhythm at 101  IVCD with a QRS duration of 120 ms without notching  Assessment and  Plan  A/C CHF   ischemic cardiomyopathy   implantable defibrillator St. Jude The patient's device was interrogated.  The information was reviewed. No changes were made in the programming.    Nonhealing ulcers.  IVCD   His heart failure is being addressed by daily adjustments to his diuretics. We have reviewed the threshold affects of torsemide and I suggested that if he needed extra diuretic that he take at least 2 i.e. 40 mg; he may need 60 mg  on the second dose basis.  Device function is normal.  IVCD is relatively narrow and so CRT upgrade would not be a reasonable undertaking; furthermore, recent paper suggesting that notching also identify a subset of IVCD patients who have left ventricular delay is not evident  He is not sure whether even be able to return to work for balance issues and heart failure issues.

## 2015-04-26 ENCOUNTER — Other Ambulatory Visit: Payer: Self-pay | Admitting: Cardiology

## 2015-04-26 NOTE — Telephone Encounter (Signed)
Rx request sent to pharmacy.  

## 2015-04-29 ENCOUNTER — Other Ambulatory Visit: Payer: Self-pay | Admitting: *Deleted

## 2015-04-29 DIAGNOSIS — I70219 Atherosclerosis of native arteries of extremities with intermittent claudication, unspecified extremity: Secondary | ICD-10-CM

## 2015-07-01 ENCOUNTER — Encounter: Payer: Self-pay | Admitting: Cardiology

## 2015-07-19 ENCOUNTER — Encounter: Payer: Self-pay | Admitting: Internal Medicine

## 2015-07-19 ENCOUNTER — Ambulatory Visit (INDEPENDENT_AMBULATORY_CARE_PROVIDER_SITE_OTHER): Payer: BLUE CROSS/BLUE SHIELD | Admitting: *Deleted

## 2015-07-19 DIAGNOSIS — I255 Ischemic cardiomyopathy: Secondary | ICD-10-CM | POA: Diagnosis not present

## 2015-07-19 DIAGNOSIS — I5022 Chronic systolic (congestive) heart failure: Secondary | ICD-10-CM | POA: Diagnosis not present

## 2015-07-19 LAB — CUP PACEART INCLINIC DEVICE CHECK
Battery Remaining Longevity: 80.4
Brady Statistic RV Percent Paced: 0 %
Date Time Interrogation Session: 20170717094431
HIGH POWER IMPEDANCE MEASURED VALUE: 44.0236
Implantable Lead Model: 7001
Lead Channel Pacing Threshold Amplitude: 0.75 V
Lead Channel Pacing Threshold Pulse Width: 0.5 ms
Lead Channel Sensing Intrinsic Amplitude: 11.7 mV
Lead Channel Setting Pacing Pulse Width: 0.5 ms
MDC IDC LEAD IMPLANT DT: 20070207
MDC IDC LEAD LOCATION: 753860
MDC IDC MSMT LEADCHNL RV IMPEDANCE VALUE: 425 Ohm
MDC IDC MSMT LEADCHNL RV PACING THRESHOLD AMPLITUDE: 0.75 V
MDC IDC MSMT LEADCHNL RV PACING THRESHOLD PULSEWIDTH: 0.5 ms
MDC IDC SET LEADCHNL RV PACING AMPLITUDE: 2.5 V
MDC IDC SET LEADCHNL RV SENSING SENSITIVITY: 0.5 mV
Pulse Gen Serial Number: 7155171

## 2015-07-19 NOTE — Progress Notes (Signed)
ICD check in clinic. Normal device function. Threshold and sensing consistent with previous device measurements. Impedance trends stable over time. No evidence of any ventricular arrhythmias. Histogram distribution appropriate for patient and level of activity. Stable thoracic impedance. No changes made this session. Device programmed at appropriate safety margins. Device programmed to optimize intrinsic conduction. Estimated longevity 6.7 years. Pt will follow up with the Device clinic in 3 months. Patient education completed including shock plan. Vibration demonstrated for patient.

## 2015-09-02 ENCOUNTER — Other Ambulatory Visit: Payer: Self-pay | Admitting: Cardiology

## 2015-09-02 DIAGNOSIS — I5022 Chronic systolic (congestive) heart failure: Secondary | ICD-10-CM

## 2015-09-02 DIAGNOSIS — Z95810 Presence of automatic (implantable) cardiac defibrillator: Secondary | ICD-10-CM

## 2015-09-02 DIAGNOSIS — F17200 Nicotine dependence, unspecified, uncomplicated: Secondary | ICD-10-CM

## 2015-09-02 DIAGNOSIS — I679 Cerebrovascular disease, unspecified: Secondary | ICD-10-CM

## 2015-09-02 DIAGNOSIS — I10 Essential (primary) hypertension: Secondary | ICD-10-CM

## 2015-09-02 DIAGNOSIS — E785 Hyperlipidemia, unspecified: Secondary | ICD-10-CM

## 2015-09-02 DIAGNOSIS — I2510 Atherosclerotic heart disease of native coronary artery without angina pectoris: Secondary | ICD-10-CM

## 2015-09-02 DIAGNOSIS — I739 Peripheral vascular disease, unspecified: Secondary | ICD-10-CM

## 2015-10-14 ENCOUNTER — Encounter: Payer: Self-pay | Admitting: Vascular Surgery

## 2015-10-20 ENCOUNTER — Ambulatory Visit (HOSPITAL_COMMUNITY)
Admission: RE | Admit: 2015-10-20 | Discharge: 2015-10-20 | Disposition: A | Discharge status: Home / Self Care | Payer: BLUE CROSS/BLUE SHIELD | Source: Ambulatory Visit | Attending: Vascular Surgery | Admitting: Vascular Surgery

## 2015-10-20 ENCOUNTER — Ambulatory Visit (INDEPENDENT_AMBULATORY_CARE_PROVIDER_SITE_OTHER): Payer: BLUE CROSS/BLUE SHIELD | Admitting: *Deleted

## 2015-10-20 ENCOUNTER — Ambulatory Visit (INDEPENDENT_AMBULATORY_CARE_PROVIDER_SITE_OTHER)
Admission: RE | Admit: 2015-10-20 | Discharge: 2015-10-20 | Disposition: A | Discharge status: Home / Self Care | Payer: BLUE CROSS/BLUE SHIELD | Source: Ambulatory Visit | Attending: Vascular Surgery | Admitting: Vascular Surgery

## 2015-10-20 ENCOUNTER — Encounter: Payer: Self-pay | Admitting: Vascular Surgery

## 2015-10-20 ENCOUNTER — Ambulatory Visit (INDEPENDENT_AMBULATORY_CARE_PROVIDER_SITE_OTHER): Payer: BLUE CROSS/BLUE SHIELD | Admitting: Vascular Surgery

## 2015-10-20 VITALS — BP 132/84 | HR 89 | Temp 97.3°F | Resp 18 | Ht 72.0 in | Wt 239.0 lb

## 2015-10-20 DIAGNOSIS — I70212 Atherosclerosis of native arteries of extremities with intermittent claudication, left leg: Secondary | ICD-10-CM | POA: Insufficient documentation

## 2015-10-20 DIAGNOSIS — Z95810 Presence of automatic (implantable) cardiac defibrillator: Secondary | ICD-10-CM | POA: Diagnosis not present

## 2015-10-20 DIAGNOSIS — I739 Peripheral vascular disease, unspecified: Secondary | ICD-10-CM

## 2015-10-20 DIAGNOSIS — I70219 Atherosclerosis of native arteries of extremities with intermittent claudication, unspecified extremity: Secondary | ICD-10-CM | POA: Diagnosis not present

## 2015-10-20 DIAGNOSIS — F17200 Nicotine dependence, unspecified, uncomplicated: Secondary | ICD-10-CM | POA: Diagnosis not present

## 2015-10-20 LAB — CUP PACEART INCLINIC DEVICE CHECK
HIGH POWER IMPEDANCE MEASURED VALUE: 47.4079
Implantable Lead Location: 753860
Lead Channel Impedance Value: 462.5 Ohm
Lead Channel Pacing Threshold Amplitude: 0.75 V
Lead Channel Pacing Threshold Pulse Width: 0.5 ms
Lead Channel Sensing Intrinsic Amplitude: 11.7 mV
MDC IDC LEAD IMPLANT DT: 20070207
MDC IDC LEAD MODEL: 7001
MDC IDC MSMT BATTERY REMAINING LONGEVITY: 79.2
MDC IDC MSMT LEADCHNL RV PACING THRESHOLD AMPLITUDE: 0.75 V
MDC IDC MSMT LEADCHNL RV PACING THRESHOLD PULSEWIDTH: 0.5 ms
MDC IDC SESS DTM: 20171018095016
MDC IDC SET LEADCHNL RV PACING AMPLITUDE: 2.5 V
MDC IDC SET LEADCHNL RV PACING PULSEWIDTH: 0.5 ms
MDC IDC SET LEADCHNL RV SENSING SENSITIVITY: 0.5 mV
MDC IDC STAT BRADY RV PERCENT PACED: 0 %
Pulse Gen Serial Number: 7155171

## 2015-10-20 NOTE — Progress Notes (Signed)
Patient name: Dalton RainwaterLarry D [name] MRN: [id number] DOB: June 19, 1955 Sex: male  REASON FOR VISIT: Follow up after left femoral to below-knee popliteal artery bypass. The patient has had a previous right femoropopliteal bypass with vein in 2015.  HPI: Dalton RainwaterLarry D [name] is a 60 y.o. male this patient presented with multiple venous stasis ulcers of the left leg and has severe infrainguinal arterial occlusive disease with single-vessel runoff via the peroneal artery. The saphenous vein was not adequate and therefore he had a femoral to below knee popliteal artery bypass with a prosthetic graft on 11/17/2014. Graft duplex on 02/01/2015 shows that his graft is patent. He comes in for routine follow up visit.  He has no significant claudication or rest pain. He's had some chronic lower extremity swelling. He has recently been diagnosed with Barrett's esophagus. He does continue to smoke.  Past Medical History:  Diagnosis Date  . AICD (automatic cardioverter/defibrillator) present   . Barretts syndrome   . Carotid artery disease (HCC)    a. Dopp 09/2011: 60-79% RICA, 40-59% LICA.;  b.  Carotid US (7/14):  Bilateral 60-79% => f/u 6 mos  . Cerebrovascular disease   . Chronic systolic heart failure (HCC)   . Coronary artery disease    a. AWMI requiring IABP 2005 s/p Horizon study stent-LAD, staged BMS-Cx, DESx2-RCA. b. Last Last LHC (6/06):  EF 25%, pLAD stent 40-50, after stent 40, dLAD 20, pCFX 20, pOM1 70, pRCA 20, mRCA stents ok.=> med Rx;  c.  Myoview (7/14):  EF 26%, large ant, septal, inf and apical infarct, no ischemia.  Med Rx continued  . Diabetes mellitus   . Heart murmur   . History of kidney stones   . HTN (hypertension)   . Hyperlipidemia    mixed  . ICD (implantable cardiac defibrillator), dual, in situ    St. Jude for severe LVD EF 25% 2/07 explanted 2010. Medtronic Virtuoso II DR Dual-chamber cardiverter-defibrillator  with pocket revision, Dr. Graciela HusbandsKlein  . Ischemic cardiomyopathy    a. Echo  (09/27/12): EF 20%, diffuse HK with periapical AK, no LV thrombus noted, restrictive physiology, trivial MR, mild LAE, RVSF mildly reduced, PASP 39  . Presence of permanent cardiac pacemaker   . PVD (peripheral vascular disease) (HCC)    a. ABI 09/2011 - R normal, L moderate - saw Dr. Kirke CorinArida - med rx.   Marland Kitchen. RIATA ICD Lead--on advisory recall    . Tobacco abuse     Family History  Problem Relation Age of Onset  . Diabetes Mother   . Coronary artery disease Father   . Heart disease Father     before age 61    SOCIAL HISTORY: Social History  Substance Use Topics  . Smoking status: Current Every Day Smoker    Packs/day: 1.00    Years: 33.00    Types: Cigarettes  . Smokeless tobacco: Never Used     Comment: smoker for 33 years  . Alcohol use No    Allergies  Allergen Reactions  . Niacin Itching and Other (See Comments)    Flushing   . Metformin And Related Nausea And Vomiting    *Only the extended release*    Current Outpatient Prescriptions  Medication Sig Dispense Refill  . aspirin 81 MG tablet Take 1 tablet (81 mg total) by mouth daily. 30 tablet 3  . atorvastatin (LIPITOR) 80 MG tablet Take 1 tablet (80 mg total) by mouth every other day. 30 tablet 11  . carvedilol (COREG) 6.25 MG tablet  Take 1 tablet (6.25 mg total) by mouth 2 (two) times daily with a meal. (Patient taking differently: Take 6.25 mg by mouth daily. ) 60 tablet 11  . ezetimibe (ZETIA) 10 MG tablet TAKE ONE TABLET BY MOUTH ONCE DAILY 30 tablet 6  . ibuprofen (ADVIL,MOTRIN) 200 MG tablet Take 200 mg by mouth every 6 (six) hours as needed for moderate pain.    Marland Kitchen insulin NPH-regular Human (NOVOLIN 70/30) (70-30) 100 UNIT/ML injection Inject 16-40 Units into the skin 2 (two) times daily with a meal. 32 units in the morning, 16 units in the evening    . JARDIANCE 25 MG TABS tablet Take 1 tablet by mouth daily. Reported on 07/19/2015    . potassium chloride (K-DUR,KLOR-CON) 10 MEQ tablet Take 1 tablet by mouth daily as  needed. Reported on 07/19/2015    . sacubitril-valsartan (ENTRESTO) 24-26 MG Take 1 tablet by mouth 2 (two) times daily. (Patient taking differently: Take 1 tablet by mouth daily. ) 60 tablet 6  . spironolactone (ALDACTONE) 25 MG tablet Take 0.5 tablets (12.5 mg total) by mouth daily. 15 tablet 1  . torsemide (DEMADEX) 20 MG tablet Take three tablets (60 mg) by mouth once daily 07/19/15 Pt's taking 120mg  daily    . torsemide (DEMADEX) 20 MG tablet TAKE THREE TABLETS BY MOUTH ONCE DAILY 90 tablet 11  . insulin NPH Human (HUMULIN N,NOVOLIN N) 100 UNIT/ML injection Inject 10 Units into the skin daily. Reported on 07/19/2015     No current facility-administered medications for this visit.     REVIEW OF SYSTEMS:  [X]  denotes positive finding, [ ]  denotes negative finding Cardiac  Comments:  Chest pain or chest pressure:    Shortness of breath upon exertion:    Short of breath when lying flat:    Irregular heart rhythm:        Vascular    Pain in calf, thigh, or hip brought on by ambulation:    Pain in feet at night that wakes you up from your sleep:     Blood clot in your veins:    Leg swelling:  X       Pulmonary    Oxygen at home:    Productive cough:     Wheezing:         Neurologic    Sudden weakness in arms or legs:     Sudden numbness in arms or legs:     Sudden onset of difficulty speaking or slurred speech:    Temporary loss of vision in one eye:     Problems with dizziness:  X       Gastrointestinal    Blood in stool:     Vomited blood:         Genitourinary    Burning when urinating:     Blood in urine:        Psychiatric    Major depression:         Hematologic    Bleeding problems:    Problems with blood clotting too easily:        Skin    Rashes or ulcers:        Constitutional    Fever or chills:      PHYSICAL EXAM: Vitals:   10/20/15 1132  BP: 132/84  Pulse: 89  Resp: 18  Temp: 97.3 F (36.3 C)  TempSrc: Oral  SpO2: 96%  Weight: 239 lb  (108.4 kg)  Height: 6' (1.829 m)    GENERAL:  The patient is a well-nourished male, in no acute distress. The vital signs are documented above. CARDIAC: There is a regular rate and rhythm.  VASCULAR: I do not detect carotid bruits. He has palpable femoral pulses although the left femoral pulse is slightly diminished. Both feet appear adequately perfused. He has hyperpigmentation bilaterally consistent with chronic venous insufficiency. PULMONARY: There is good air exchange bilaterally without wheezing or rales. ABDOMEN: Soft and non-tender with normal pitched bowel sounds.  MUSCULOSKELETAL: There are no major deformities or cyanosis. NEUROLOGIC: No focal weakness or paresthesias are detected. SKIN: There are no ulcers or rashes noted. PSYCHIATRIC: The patient has a normal affect.  DATA:   LOWER EXTREMITY ARTERIAL DUPLEX: I have independently interpreted his duplex of the left lower extremity. His bypass graft is patent. There is evidence of some mild to moderate inflow disease above the graft. ABIs could not be obtained as the vessels are noncompressible. He has peroneal runoff only.  LOWER EXTREMITY ARTERIAL DOPPLER: Toe pressure on the left is 75 mmHg. ABIs could not be obtained as the arteries are not compressible. He has a biphasic posterior tibial signal on the right.  MEDICAL ISSUES:  STATUS POST BILATERAL FEMOROPOPLITEAL BYPASS GRAFTS: His bypass grafts are patent. We have again discussed the importance of tobacco cessation. We'll continue to follow his grafts closely and I have ordered a duplex of both grafts in 6 months with ABIs at that time. He noted to call sooner if he has problems.   Waverly Ferrari [facility] [phone]

## 2015-10-20 NOTE — Progress Notes (Signed)
ICD check in clinic. Normal device function. Threshold and sensing consistent with previous device measurements. Impedance trend stable over time. No evidence of any ventricular arrhythmias. [name]. Histogram distribution appropriate for patient and level of activity. No changes made this session. Device programmed at appropriate safety margins. Device programmed to optimize intrinsic conduction. Estimated longevity 6.6 years. ROV with device clinic 01/19/2016. Patient education completed including shock plan.

## 2015-11-16 NOTE — Addendum Note (Signed)
Addended by: Melodye PedMANESS-HARRISON, CHANDA C on: 11/16/2015 01:20 PM   Modules accepted: Orders

## 2016-01-05 ENCOUNTER — Inpatient Hospital Stay (HOSPITAL_COMMUNITY): Payer: BLUE CROSS/BLUE SHIELD

## 2016-01-05 ENCOUNTER — Inpatient Hospital Stay (HOSPITAL_COMMUNITY)
Admission: EM | Admit: 2016-01-05 | Discharge: 2016-01-11 | DRG: 286 | Disposition: A | Discharge status: Home / Self Care | Payer: BLUE CROSS/BLUE SHIELD | Attending: Internal Medicine | Admitting: Internal Medicine

## 2016-01-05 ENCOUNTER — Encounter (HOSPITAL_COMMUNITY): Payer: Self-pay | Admitting: Emergency Medicine

## 2016-01-05 ENCOUNTER — Emergency Department (HOSPITAL_COMMUNITY): Payer: BLUE CROSS/BLUE SHIELD

## 2016-01-05 DIAGNOSIS — E785 Hyperlipidemia, unspecified: Secondary | ICD-10-CM | POA: Diagnosis not present

## 2016-01-05 DIAGNOSIS — I2510 Atherosclerotic heart disease of native coronary artery without angina pectoris: Secondary | ICD-10-CM | POA: Diagnosis not present

## 2016-01-05 DIAGNOSIS — I679 Cerebrovascular disease, unspecified: Secondary | ICD-10-CM | POA: Diagnosis present

## 2016-01-05 DIAGNOSIS — I10 Essential (primary) hypertension: Secondary | ICD-10-CM | POA: Diagnosis present

## 2016-01-05 DIAGNOSIS — Z95828 Presence of other vascular implants and grafts: Secondary | ICD-10-CM | POA: Diagnosis not present

## 2016-01-05 DIAGNOSIS — I255 Ischemic cardiomyopathy: Secondary | ICD-10-CM | POA: Diagnosis present

## 2016-01-05 DIAGNOSIS — I5023 Acute on chronic systolic (congestive) heart failure: Secondary | ICD-10-CM | POA: Diagnosis present

## 2016-01-05 DIAGNOSIS — I110 Hypertensive heart disease with heart failure: Principal | ICD-10-CM | POA: Diagnosis present

## 2016-01-05 DIAGNOSIS — Z955 Presence of coronary angioplasty implant and graft: Secondary | ICD-10-CM | POA: Diagnosis not present

## 2016-01-05 DIAGNOSIS — E119 Type 2 diabetes mellitus without complications: Secondary | ICD-10-CM

## 2016-01-05 DIAGNOSIS — Z95810 Presence of automatic (implantable) cardiac defibrillator: Secondary | ICD-10-CM | POA: Diagnosis not present

## 2016-01-05 DIAGNOSIS — I509 Heart failure, unspecified: Secondary | ICD-10-CM | POA: Diagnosis not present

## 2016-01-05 DIAGNOSIS — F17200 Nicotine dependence, unspecified, uncomplicated: Secondary | ICD-10-CM | POA: Diagnosis present

## 2016-01-05 DIAGNOSIS — J90 Pleural effusion, not elsewhere classified: Secondary | ICD-10-CM | POA: Diagnosis present

## 2016-01-05 DIAGNOSIS — J189 Pneumonia, unspecified organism: Secondary | ICD-10-CM

## 2016-01-05 DIAGNOSIS — E1151 Type 2 diabetes mellitus with diabetic peripheral angiopathy without gangrene: Secondary | ICD-10-CM | POA: Diagnosis present

## 2016-01-05 DIAGNOSIS — I878 Other specified disorders of veins: Secondary | ICD-10-CM | POA: Diagnosis present

## 2016-01-05 DIAGNOSIS — I6523 Occlusion and stenosis of bilateral carotid arteries: Secondary | ICD-10-CM | POA: Diagnosis present

## 2016-01-05 DIAGNOSIS — J9601 Acute respiratory failure with hypoxia: Secondary | ICD-10-CM | POA: Diagnosis present

## 2016-01-05 DIAGNOSIS — E876 Hypokalemia: Secondary | ICD-10-CM | POA: Diagnosis not present

## 2016-01-05 DIAGNOSIS — I5022 Chronic systolic (congestive) heart failure: Secondary | ICD-10-CM | POA: Diagnosis present

## 2016-01-05 DIAGNOSIS — I252 Old myocardial infarction: Secondary | ICD-10-CM | POA: Diagnosis not present

## 2016-01-05 DIAGNOSIS — I739 Peripheral vascular disease, unspecified: Secondary | ICD-10-CM | POA: Diagnosis present

## 2016-01-05 DIAGNOSIS — F17210 Nicotine dependence, cigarettes, uncomplicated: Secondary | ICD-10-CM | POA: Diagnosis present

## 2016-01-05 DIAGNOSIS — I272 Pulmonary hypertension, unspecified: Secondary | ICD-10-CM | POA: Diagnosis present

## 2016-01-05 DIAGNOSIS — I6529 Occlusion and stenosis of unspecified carotid artery: Secondary | ICD-10-CM | POA: Diagnosis present

## 2016-01-05 DIAGNOSIS — Z01810 Encounter for preprocedural cardiovascular examination: Secondary | ICD-10-CM | POA: Diagnosis not present

## 2016-01-05 DIAGNOSIS — I249 Acute ischemic heart disease, unspecified: Secondary | ICD-10-CM | POA: Diagnosis not present

## 2016-01-05 DIAGNOSIS — Z794 Long term (current) use of insulin: Secondary | ICD-10-CM | POA: Diagnosis not present

## 2016-01-05 DIAGNOSIS — E118 Type 2 diabetes mellitus with unspecified complications: Secondary | ICD-10-CM | POA: Insufficient documentation

## 2016-01-05 DIAGNOSIS — Z888 Allergy status to other drugs, medicaments and biological substances status: Secondary | ICD-10-CM

## 2016-01-05 DIAGNOSIS — J181 Lobar pneumonia, unspecified organism: Secondary | ICD-10-CM

## 2016-01-05 HISTORY — PX: US GUIDED THORACENTESIS RIGHT (ARMC HX): HXRAD1168

## 2016-01-05 HISTORY — DX: Pleural effusion, not elsewhere classified: J90

## 2016-01-05 LAB — I-STAT CG4 LACTIC ACID, ED
LACTIC ACID, VENOUS: 2 mmol/L — AB (ref 0.5–1.9)
Lactic Acid, Venous: 1.23 mmol/L (ref 0.5–1.9)

## 2016-01-05 LAB — BASIC METABOLIC PANEL
ANION GAP: 11 (ref 5–15)
BUN: 16 mg/dL (ref 6–20)
CO2: 28 mmol/L (ref 22–32)
Calcium: 8.4 mg/dL — ABNORMAL LOW (ref 8.9–10.3)
Chloride: 97 mmol/L — ABNORMAL LOW (ref 101–111)
Creatinine, Ser: 1.05 mg/dL (ref 0.61–1.24)
Glucose, Bld: 72 mg/dL (ref 65–99)
POTASSIUM: 3 mmol/L — AB (ref 3.5–5.1)
SODIUM: 136 mmol/L (ref 135–145)

## 2016-01-05 LAB — GLUCOSE, SEROUS FLUID: GLUCOSE FL: 108 mg/dL

## 2016-01-05 LAB — LACTATE DEHYDROGENASE, PLEURAL OR PERITONEAL FLUID: LD FL: 85 U/L — AB (ref 3–23)

## 2016-01-05 LAB — GRAM STAIN

## 2016-01-05 LAB — CBC
HEMATOCRIT: 36.9 % — AB (ref 39.0–52.0)
HEMOGLOBIN: 12.1 g/dL — AB (ref 13.0–17.0)
MCH: 27.8 pg (ref 26.0–34.0)
MCHC: 32.8 g/dL (ref 30.0–36.0)
MCV: 84.8 fL (ref 78.0–100.0)
Platelets: 121 10*3/uL — ABNORMAL LOW (ref 150–400)
RBC: 4.35 MIL/uL (ref 4.22–5.81)
RDW: 18.4 % — AB (ref 11.5–15.5)
WBC: 6.3 10*3/uL (ref 4.0–10.5)

## 2016-01-05 LAB — CBG MONITORING, ED
GLUCOSE-CAPILLARY: 345 mg/dL — AB (ref 65–99)
GLUCOSE-CAPILLARY: 252 mg/dL — AB (ref 65–99)

## 2016-01-05 LAB — GLUCOSE, CAPILLARY: Glucose-Capillary: 170 mg/dL — ABNORMAL HIGH (ref 65–99)

## 2016-01-05 LAB — I-STAT TROPONIN, ED: Troponin i, poc: 0 ng/mL (ref 0.00–0.08)

## 2016-01-05 LAB — BODY FLUID CELL COUNT WITH DIFFERENTIAL
LYMPHS FL: 11 %
Monocyte-Macrophage-Serous Fluid: 82 % (ref 50–90)
NEUTROPHIL FLUID: 7 % (ref 0–25)
WBC FLUID: 155 uL (ref 0–1000)

## 2016-01-05 LAB — TROPONIN I: Troponin I: <0.03 ng/mL (ref <0.03)

## 2016-01-05 LAB — BRAIN NATRIURETIC PEPTIDE: B Natriuretic Peptide: 591.1 pg/mL — ABNORMAL HIGH (ref 0.0–100.0)

## 2016-01-05 LAB — PROTEIN, BODY FLUID: Total protein, fluid: 4.1 g/dL

## 2016-01-05 MED ORDER — INSULIN ASPART 100 UNIT/ML ~~LOC~~ SOLN
0.0000 [IU] | Freq: Three times a day (TID) | SUBCUTANEOUS | Status: DC
  Administered 2016-01-05: 5 [IU] via SUBCUTANEOUS
  Administered 2016-01-05: 7 [IU] via SUBCUTANEOUS
  Administered 2016-01-06: 1 [IU] via SUBCUTANEOUS
  Administered 2016-01-07 (×2): 2 [IU] via SUBCUTANEOUS
  Administered 2016-01-08: 3 [IU] via SUBCUTANEOUS
  Administered 2016-01-08: 2 [IU] via SUBCUTANEOUS
  Administered 2016-01-09: 1 [IU] via SUBCUTANEOUS
  Administered 2016-01-09: 2 [IU] via SUBCUTANEOUS
  Administered 2016-01-09: 3 [IU] via SUBCUTANEOUS
  Administered 2016-01-10: 5 [IU] via SUBCUTANEOUS
  Administered 2016-01-10 (×2): 3 [IU] via SUBCUTANEOUS
  Administered 2016-01-11: 2 [IU] via SUBCUTANEOUS
  Administered 2016-01-11: 3 [IU] via SUBCUTANEOUS
  Filled 2016-01-05: qty 1

## 2016-01-05 MED ORDER — CARVEDILOL 6.25 MG PO TABS
6.2500 mg | ORAL_TABLET | Freq: Every day | ORAL | Status: DC
  Administered 2016-01-05: 6.25 mg via ORAL
  Filled 2016-01-05 (×2): qty 1

## 2016-01-05 MED ORDER — IPRATROPIUM-ALBUTEROL 0.5-2.5 (3) MG/3ML IN SOLN
3.0000 mL | RESPIRATORY_TRACT | Status: DC | PRN
  Administered 2016-01-05: 3 mL via RESPIRATORY_TRACT
  Filled 2016-01-05: qty 3

## 2016-01-05 MED ORDER — SPIRONOLACTONE 25 MG PO TABS
12.5000 mg | ORAL_TABLET | Freq: Every day | ORAL | Status: DC
  Administered 2016-01-05 – 2016-01-08 (×4): 12.5 mg via ORAL
  Filled 2016-01-05 (×4): qty 1

## 2016-01-05 MED ORDER — ONDANSETRON HCL 4 MG/2ML IJ SOLN
4.0000 mg | Freq: Four times a day (QID) | INTRAMUSCULAR | Status: DC | PRN
  Administered 2016-01-06 (×2): 4 mg via INTRAVENOUS
  Filled 2016-01-05 (×2): qty 2

## 2016-01-05 MED ORDER — LEVOFLOXACIN IN D5W 500 MG/100ML IV SOLN
500.0000 mg | Freq: Once | INTRAVENOUS | Status: AC
  Administered 2016-01-05: 500 mg via INTRAVENOUS
  Filled 2016-01-05: qty 100

## 2016-01-05 MED ORDER — ACETAMINOPHEN 325 MG PO TABS
650.0000 mg | ORAL_TABLET | ORAL | Status: DC | PRN
Start: 2016-01-05 — End: 2016-01-08

## 2016-01-05 MED ORDER — ATORVASTATIN CALCIUM 80 MG PO TABS
80.0000 mg | ORAL_TABLET | ORAL | Status: DC
  Administered 2016-01-05 – 2016-01-11 (×4): 80 mg via ORAL
  Filled 2016-01-05 (×5): qty 1

## 2016-01-05 MED ORDER — HEPARIN SODIUM (PORCINE) 5000 UNIT/ML IJ SOLN: 5000.0000 [IU] | Freq: Three times a day (TID) | INTRAMUSCULAR | Status: DC

## 2016-01-05 MED ORDER — FUROSEMIDE 10 MG/ML IJ SOLN
120.0000 mg | Freq: Once | INTRAVENOUS | Status: AC
  Administered 2016-01-05: 120 mg via INTRAVENOUS
  Filled 2016-01-05: qty 12

## 2016-01-05 MED ORDER — LEVOFLOXACIN IN D5W 500 MG/100ML IV SOLN
500.0000 mg | INTRAVENOUS | Status: DC
  Administered 2016-01-06: 500 mg via INTRAVENOUS
  Filled 2016-01-05: qty 100

## 2016-01-05 MED ORDER — SACUBITRIL-VALSARTAN 24-26 MG PO TABS
1.0000 | ORAL_TABLET | Freq: Two times a day (BID) | ORAL | Status: DC
  Administered 2016-01-05: 1 via ORAL
  Filled 2016-01-05: qty 1

## 2016-01-05 MED ORDER — TORSEMIDE 20 MG PO TABS
60.0000 mg | ORAL_TABLET | Freq: Every day | ORAL | Status: DC
  Administered 2016-01-05: 60 mg via ORAL
  Filled 2016-01-05 (×2): qty 3

## 2016-01-05 MED ORDER — IPRATROPIUM-ALBUTEROL 0.5-2.5 (3) MG/3ML IN SOLN
3.0000 mL | Freq: Once | RESPIRATORY_TRACT | Status: AC
  Administered 2016-01-05: 3 mL via RESPIRATORY_TRACT
  Filled 2016-01-05: qty 3

## 2016-01-05 MED ORDER — IOPAMIDOL (ISOVUE-300) INJECTION 61%
INTRAVENOUS | Status: AC
  Administered 2016-01-05: 75 mL via INTRAVENOUS
  Filled 2016-01-05: qty 75

## 2016-01-05 MED ORDER — SODIUM CHLORIDE 0.9% FLUSH
3.0000 mL | Freq: Two times a day (BID) | INTRAVENOUS | Status: DC
  Administered 2016-01-05 – 2016-01-11 (×12): 3 mL via INTRAVENOUS

## 2016-01-05 MED ORDER — GUAIFENESIN-DM 100-10 MG/5ML PO SYRP
10.0000 mL | ORAL_SOLUTION | ORAL | Status: DC | PRN
  Administered 2016-01-05 – 2016-01-08 (×5): 10 mL via ORAL
  Filled 2016-01-05 (×5): qty 10

## 2016-01-05 MED ORDER — SODIUM CHLORIDE 0.9 % IV BOLUS (SEPSIS)
500.0000 mL | Freq: Once | INTRAVENOUS | Status: AC
  Administered 2016-01-05: 500 mL via INTRAVENOUS

## 2016-01-05 MED ORDER — SODIUM CHLORIDE 0.9 % IV SOLN: 250.0000 mL | INTRAVENOUS | Status: DC | PRN

## 2016-01-05 MED ORDER — SACUBITRIL-VALSARTAN 24-26 MG PO TABS
1.0000 | ORAL_TABLET | Freq: Two times a day (BID) | ORAL | Status: DC
  Administered 2016-01-06: 1 via ORAL
  Filled 2016-01-05 (×2): qty 1

## 2016-01-05 MED ORDER — EZETIMIBE 10 MG PO TABS
10.0000 mg | ORAL_TABLET | Freq: Every day | ORAL | Status: DC
  Administered 2016-01-05 – 2016-01-11 (×7): 10 mg via ORAL
  Filled 2016-01-05 (×7): qty 1

## 2016-01-05 MED ORDER — PREDNISONE 20 MG PO TABS
60.0000 mg | ORAL_TABLET | Freq: Once | ORAL | Status: AC
  Administered 2016-01-05: 60 mg via ORAL
  Filled 2016-01-05: qty 3

## 2016-01-05 MED ORDER — ASPIRIN EC 81 MG PO TBEC
81.0000 mg | DELAYED_RELEASE_TABLET | Freq: Every day | ORAL | Status: DC
  Administered 2016-01-05 – 2016-01-11 (×7): 81 mg via ORAL
  Filled 2016-01-05 (×7): qty 1

## 2016-01-05 NOTE — ED Notes (Signed)
Patient transported to X-ray 

## 2016-01-05 NOTE — ED Notes (Signed)
Pt's SPO2 was 89%. Pt was sleeping and looking through the pt's vitals it appears his SPO2 drops when he sleeps.  Placed the pt on 2L O2 [place].  Will observe pt and see how vitals progress. Pt still has active no productive cough.

## 2016-01-05 NOTE — ED Triage Notes (Signed)
BIB EMS from work. L sided CP starting 1 hr ago. Has had cough X2 weeks. Cardiac hx. Given 324 ASA, 1NTG. VSS, A/OX4

## 2016-01-05 NOTE — ED Notes (Signed)
Patient unable to void at this time, He is aware we need a Ua.

## 2016-01-05 NOTE — ED Notes (Signed)
Pt received breakfast tray. RN gave pt apple juice and crackers as well.

## 2016-01-05 NOTE — ED Notes (Signed)
Pt CBG was 252, notified Casey(RN)

## 2016-01-05 NOTE — ED Notes (Signed)
Patient returned from xray.

## 2016-01-05 NOTE — ED Provider Notes (Signed)
[facility]-EMERGENCY DEPT Provider Note   CSN: [id number] Arrival date & time: 01/05/16  0044  By signing my name below, I, Alyssa Grove, attest that this documentation has been prepared under the direction and in the presence of Shon Baton, MD. Electronically Signed: Alyssa Grove, ED Scribe. 01/05/16. 4:41 AM.   History   Chief Complaint Chief Complaint  Patient presents with  . Chest Pain  . Cough   The history is provided by the patient. No language interpreter was used.   HPI Comments: [name] is a 61 y.o. male  With PMHx of CAD, CHF, DM, HTN and HLD who presents to the Emergency Department complaining of sudden onset, constant, moderate, sharp left sided chest pain onset 1 hour PTA. Pt feels as if he has "cracked a rib". Pain is not exacerbated with deep inhalation. He reports associated leg swelling, abdominal distension and cough for 2 weeks. Cough is intermittently productive. Pt is a smoker and has significant cardiac hx including MI in 2005, CHF, ICD and several stents. Pt denies fever, nausea, vomiting, diarrhea.  Past Medical History:  Diagnosis Date  . AICD (automatic cardioverter/defibrillator) present   . Barretts syndrome   . Carotid artery disease (HCC)    a. Dopp 09/2011: 60-79% RICA, 40-59% LICA.;  b.  Carotid US (7/14):  Bilateral 60-79% => f/u 6 mos  . Cerebrovascular disease   . Chronic systolic heart failure (HCC)   . Coronary artery disease    a. AWMI requiring IABP 2005 s/p Horizon study stent-LAD, staged BMS-Cx, DESx2-RCA. b. Last Last LHC (6/06):  EF 25%, pLAD stent 40-50, after stent 40, dLAD 20, pCFX 20, pOM1 70, pRCA 20, mRCA stents ok.=> med Rx;  c.  Myoview (7/14):  EF 26%, large ant, septal, inf and apical infarct, no ischemia.  Med Rx continued  . Diabetes mellitus   . Heart murmur   . History of kidney stones   . HTN (hypertension)   . Hyperlipidemia    mixed  . ICD (implantable cardiac defibrillator), dual, in situ    St. Jude for severe  LVD EF 25% 2/07 explanted 2010. Medtronic Virtuoso II DR Dual-chamber cardiverter-defibrillator  with pocket revision, Dr. Graciela Husbands  . Ischemic cardiomyopathy    a. Echo (09/27/12): EF 20%, diffuse HK with periapical AK, no LV thrombus noted, restrictive physiology, trivial MR, mild LAE, RVSF mildly reduced, PASP 39  . Presence of permanent cardiac pacemaker   . PVD (peripheral vascular disease) (HCC)    a. ABI 09/2011 - R normal, L moderate - saw Dr. Kirke Corin - med rx.   Marland Kitchen RIATA ICD Lead--on advisory recall    . Tobacco abuse     Patient Active Problem List   Diagnosis Date Noted  . UTI (lower urinary tract infection) 03/13/2015  . Hypoglycemia 03/13/2015  . Chronic systolic CHF (congestive heart failure) (HCC) 03/13/2015  . Cellulitis of left lower extremity   . Pressure ulcer 12/31/2014  . CHF, acute on chronic (HCC) 12/30/2014  . Acute on chronic heart failure (HCC) 12/30/2014  . Pleural effusion 12/30/2014  . Acute on chronic systolic congestive heart failure (HCC)   . Hyperglycemia without ketosis   . UTI (urinary tract infection) 09/23/2014  . Infected open wound 09/22/2014  . Type 2 diabetes mellitus (HCC) 09/22/2014  . Skin ulcer (HCC) 09/21/2014  . Type 2 diabetes mellitus with hyperglycemia (HCC)   . PAD (peripheral artery disease) (HCC) 11/07/2013  . PVD (peripheral vascular disease) (HCC)   . Pre-operative  cardiovascular examination   . Cellulitis of right foot 11/03/2013  . Cellulitis of right lower extremity 11/03/2013  . Metabolic alkalosis 12/07/2012  . Acute on chronic systolic heart failure (HCC) 12/03/2012  . Upper respiratory infection 10/01/2012  . Hypotension 10/01/2012  . Carotid stenosis 10/25/2011  . Hypertension 08/09/2010  . Automatic implantable cardioverter-defibrillator in St. Jude 08/09/2010  . Coronary atherosclerosis 01/21/2009  . AODM 05/15/2008  . TOBACCO ABUSE 05/15/2008  . CARDIOMYOPATHY, ISCHEMIC 05/15/2008  . Cerebrovascular disease  05/15/2008  . Peripheral vascular disease (HCC) 05/15/2008  . CHEST PAIN 05/15/2008  . Hyperlipidemia 01/22/2008  . SYSTOLIC HEART FAILURE, CHRONIC 01/22/2008    Past Surgical History:  Procedure Laterality Date  . ABDOMINAL AORTAGRAM N/A 11/06/2013   Procedure: ABDOMINAL Ronny Flurry;  Surgeon: Chuck Hint, MD;  Location: [facility] CATH LAB;  Service: Cardiovascular;  Laterality: N/A;  . APPENDECTOMY  2000   ruptured  . FEMORAL-POPLITEAL BYPASS GRAFT Right 11/07/2013   Procedure: Right FEMORAL-POPLITEAL ARTERY Bypass ;  Surgeon: Chuck Hint, MD;  Location: [facility] OR;  Service: Vascular;  Laterality: Right;  . FEMORAL-POPLITEAL BYPASS GRAFT Left 11/17/2014   Procedure: BYPASS GRAFT FEMORAL-POPLITEAL ARTERY USING GORE PROPATEN X 80CM VASCULAR GRAFT;  Surgeon: Chuck Hint, MD;  Location: [facility] OR;  Service: Vascular;  Laterality: Left;  . IMPLANTABLE CARDIOVERTER DEFIBRILLATOR (ICD) GENERATOR CHANGE N/A 12/16/2012   Procedure: ICD GENERATOR CHANGE;  Surgeon: [name], MD;  Location: [facility] CATH LAB;  Service: Cardiovascular;  Laterality: N/A;  . INTRAOPERATIVE ARTERIOGRAM Right 11/07/2013   Procedure: INTRA OPERATIVE ARTERIOGRAM;  Surgeon: Chuck Hint, MD;  Location: [facility] OR;  Service: Vascular;  Laterality: Right;  . LOWER EXTREMITY ANGIOGRAM Bilateral 11/06/2013   Procedure: LOWER EXTREMITY ANGIOGRAM;  Surgeon: Chuck Hint, MD;  Location: [facility] CATH LAB;  Service: Cardiovascular;  Laterality: Bilateral;  . Medtronic Virtuoso II DR dual-chamber cardioverter-defibrillation with pocket revison     Dr. Sherryl Manges  . PERIPHERAL VASCULAR CATHETERIZATION N/A 09/25/2014   Procedure: Abdominal Aortogram;  Surgeon: Sherren Kerns, MD;  Location: [facility] INVASIVE CV LAB;  Service: Cardiovascular;  Laterality: N/A;       Home Medications    Prior to Admission medications   Medication Sig Start Date End Date Taking? Authorizing Provider  aspirin 81 MG tablet Take 1  tablet (81 mg total) by mouth daily. 10/07/12   [name], MD  atorvastatin (LIPITOR) 80 MG tablet Take 1 tablet (80 mg total) by mouth every other day. 08/20/14   Lewayne Bunting, MD  carvedilol (COREG) 6.25 MG tablet Take 1 tablet (6.25 mg total) by mouth 2 (two) times daily with a meal. Patient taking differently: Take 6.25 mg by mouth daily.  04/07/15   Lewayne Bunting, MD  ezetimibe (ZETIA) 10 MG tablet TAKE ONE TABLET BY MOUTH ONCE DAILY 04/26/15   Lewayne Bunting, MD  ibuprofen (ADVIL,MOTRIN) 200 MG tablet Take 200 mg by mouth every 6 (six) hours as needed for moderate pain.    Historical Provider, MD  insulin NPH Human (HUMULIN N,NOVOLIN N) 100 UNIT/ML injection Inject 10 Units into the skin daily. Reported on 07/19/2015    Historical Provider, MD  insulin NPH-regular Human (NOVOLIN 70/30) (70-30) 100 UNIT/ML injection Inject 16-40 Units into the skin 2 (two) times daily with a meal. 32 units in the morning, 16 units in the evening    Historical Provider, MD  JARDIANCE 25 MG TABS tablet Take 1 tablet by mouth daily. Reported on 07/19/2015 03/01/15  Historical Provider, MD  potassium chloride (K-DUR,KLOR-CON) 10 MEQ tablet Take 1 tablet by mouth daily as needed. Reported on 07/19/2015 01/06/15   Historical Provider, MD  sacubitril-valsartan (ENTRESTO) 24-26 MG Take 1 tablet by mouth 2 (two) times daily. Patient taking differently: Take 1 tablet by mouth daily.  02/04/15   Lewayne Bunting, MD  spironolactone (ALDACTONE) 25 MG tablet Take 0.5 tablets (12.5 mg total) by mouth daily. 03/02/15   Lewayne Bunting, MD  torsemide (DEMADEX) 20 MG tablet Take three tablets (60 mg) by mouth once daily 07/19/15 Pt's taking 120mg  daily    Historical Provider, MD  torsemide (DEMADEX) 20 MG tablet TAKE THREE TABLETS BY MOUTH ONCE DAILY 09/02/15   Lewayne Bunting, MD    Family History Family History  Problem Relation Age of Onset  . Diabetes Mother   . Coronary artery disease Father   . Heart disease Father      before age 39    Social History Social History  Substance Use Topics  . Smoking status: Current Every Day Smoker    Packs/day: 1.00    Years: 33.00    Types: Cigarettes  . Smokeless tobacco: Never Used     Comment: smoker for 33 years  . Alcohol use No     Allergies   Niacin and Metformin and related   Review of Systems Review of Systems  Constitutional: Negative for fever.  Respiratory: Positive for cough.   Cardiovascular: Positive for chest pain and leg swelling.  Gastrointestinal: Positive for abdominal distention. Negative for diarrhea, nausea and vomiting.  All other systems reviewed and are negative.    Physical Exam Updated Vital Signs BP 97/67   Pulse 82   Temp 98.1 F (36.7 C) (Oral)   Resp (!) 28   SpO2 98%   Physical Exam  Constitutional: He is oriented to person, place, and time. No distress.  Chronically ill-appearing, no acute distress, overweight  HENT:  Head: Normocephalic and atraumatic.  Cardiovascular: Normal rate, regular rhythm and normal heart sounds.   No murmur heard. Pulmonary/Chest: Effort normal. No respiratory distress. He has rales. He exhibits tenderness.  Rales bilaterally with decreased lung sounds right lower lobe and scant expiratory wheezing  Abdominal: Soft. Bowel sounds are normal. There is no tenderness. There is no rebound.  Musculoskeletal: He exhibits edema.  1+ bilateral lower extremity edema  Neurological: He is alert and oriented to person, place, and time.  Skin: Skin is warm and dry.  Extensive dry skin bilateral lower extremities  Psychiatric: He has a normal mood and affect.  Nursing note and vitals reviewed.    ED Treatments / Results  DIAGNOSTIC STUDIES: Oxygen Saturation is 98% on RA, normal by my interpretation.    COORDINATION OF CARE: 1:06 AM Discussed treatment plan with pt at bedside which includes Cardiac monitoring, Chest XR and lab work including CBC, BMP, and Troponin and pt agreed to  plan.  Labs (all labs ordered are listed, but only abnormal results are displayed) Labs Reviewed  BASIC METABOLIC PANEL - Abnormal; Notable for the following:       Result Value   Potassium 3.0 (*)    Chloride 97 (*)    Calcium 8.4 (*)    All other components within normal limits  CBC - Abnormal; Notable for the following:    Hemoglobin 12.1 (*)    HCT 36.9 (*)    RDW 18.4 (*)    Platelets 121 (*)    All other components within  normal limits  BRAIN NATRIURETIC PEPTIDE - Abnormal; Notable for the following:    B Natriuretic Peptide 591.1 (*)    All other components within normal limits  I-STAT CG4 LACTIC ACID, ED - Abnormal; Notable for the following:    Lactic Acid, Venous 2.00 (*)    All other components within normal limits  I-STAT TROPOININ, ED  I-STAT CG4 LACTIC ACID, ED    EKG  EKG Interpretation  Date/Time:  Wednesday January 05 2016 00:54:07 EST Ventricular Rate:  87 PR Interval:    QRS Duration: 128 QT Interval:  411 QTC Calculation: 495 R Axis:   -70 Text Interpretation:  Sinus rhythm Nonspecific IVCD with LAD Inferior infarct, old Abnormal lateral Q waves Anterior infarct, old No significant change since last tracing Confirmed by Evangelene Vora  MD, Quin Mcpherson ([id number]) on 01/05/2016 1:00:48 AM       Radiology Dg Chest 2 View  Result Date: 01/05/2016 CLINICAL DATA:  Chest pain, cough. History of diabetes, cardiomyopathy/CHF. History of RIGHT thoracentesis. EXAM: CHEST  2 VIEW COMPARISON:  Chest radiograph January 07, 2015 and CT chest January 02, 2015 FINDINGS: Moderate to large RIGHT pleural effusion and underlying consolidation. Mild bronchitic changes. RIGHT heart border obscured. Mildly calcified aortic knob. Single lead LEFT cardiac pacemaker in situ. No pneumothorax. Old RIGHT lateral rib fractures. IMPRESSION: Moderate to large recurrent RIGHT pleural effusion with underlying consolidation, recommend follow up to exclude mass. Mild bronchitic changes. Electronically  Signed   By: Awilda Metroourtnay  Bloomer M.D.   On: 01/05/2016 02:04   Ct Chest W Contrast  Result Date: 01/05/2016 CLINICAL DATA:  Sudden onset of left-sided chest pain EXAM: CT CHEST WITH CONTRAST TECHNIQUE: Multidetector CT imaging of the chest was performed during intravenous contrast administration. CONTRAST:  75mL ISOVUE-300 IOPAMIDOL (ISOVUE-300) INJECTION 61% COMPARISON:  Chest radiograph 01/05/2016, CT chest 01/02/2015 FINDINGS: Cardiovascular: Atherosclerotic calcifications within the aorta. No aneurysm. Extensive coronary artery calcifications. Heart size upper normal. Partially visualized cardiac pacing lead. No large pericardial effusion. Mediastinum/Nodes: Stable nonspecific mediastinal lymph nodes. Trachea is midline. Thyroid gland unremarkable. Esophagus is within normal limits. Lungs/Pleura: Large right-sided pleural effusion with consolidation in the right lower lobe and partial consolidation in the right middle lobe. The left lung demonstrates scattered foci of hazy density in the lingula and left lower lobe which may reflect diffuse atelectasis or possibly mild edema. No pneumothorax. No left effusion. Upper Abdomen: Upper abdominal structures show no acute abnormalities. Ascites adjacent to the spleen and liver. Musculoskeletal: No suspicious bone lesions. Old right-sided rib fractures IMPRESSION: 1. Large right-sided pleural effusion with consolidation in the right lower lobe. Partial consolidation in the right middle lobe, cannot rule out a pneumonia. No significant left pleural effusion. Scattered mild densities within the left upper and lower lobes could reflect scattered foci of atelectasis or mild edema. 2. Ascites in the upper abdomen 3. No change in mild mediastinal adenopathy. Electronically Signed   By: Jasmine PangKim  Fujinaga M.D.   On: 01/05/2016 03:44    Procedures Procedures (including critical care time)  Medications Ordered in ED Medications  ipratropium-albuterol (DUONEB) 0.5-2.5 (3)  MG/3ML nebulizer solution 3 mL (3 mLs Nebulization Given 01/05/16 0150)  predniSONE (DELTASONE) tablet 60 mg (60 mg Oral Given 01/05/16 0122)  sodium chloride 0.9 % bolus 500 mL (0 mLs Intravenous Stopped 01/05/16 0313)  levofloxacin (LEVAQUIN) IVPB 500 mg (500 mg Intravenous New Bag/Given 01/05/16 0339)  iopamidol (ISOVUE-300) 61 % injection (75 mLs Intravenous Contrast Given 01/05/16 0324)     Initial Impression / Assessment  and Plan / ED Course  I have reviewed the triage vital signs and the nursing notes.  Pertinent labs & imaging results that were available during my care of the patient were reviewed by me and considered in my medical decision making (see chart for details).  Clinical Course     Patient presents with cough, chest pain. Nontoxic on exam. Blood pressure noted to be 90 systolic. Based on prior office visits, normal blood pressure 100 to 110s. He is afebrile. Decreased breath sounds right lower lobe as well as radials bilaterally and tenderness palpation. EKG is nonischemic. Troponin negative. Chest x-ray concerning for large right-sided pleural effusion. It is asymmetric. Doubt CHF although he may have some component given his lower extremity swelling. CT obtained to better characterize pleural effusion. Underlying consolidation not excluded. Patient given Levaquin for pneumonia. He is in no acute distress and satting 94% on room air; however, given size of pleural effusion, feel he likely need a thoracentesis for further evaluation. Will admit for further workup.  Final Clinical Impressions(s) / ED Diagnoses   Final diagnoses:  Community acquired pneumonia of right lower lobe of lung (HCC)  Pleural effusion    New Prescriptions New Prescriptions   No medications on file   I personally performed the services described in this documentation, which was scribed in my presence. The recorded information has been reviewed and is accurate.    Shon Baton, MD 01/05/16 [phone]

## 2016-01-05 NOTE — H&P (Signed)
History and Physical    [name] ZOX:[id number] DOB: 02/14/1955 DOA: 01/05/2016   PCP: [name], MD   Patient coming from:  Home   Chief Complaint: Shortness of breath and chest pain   HPI: [name] is a 61 y.o. male with multiple medical issues listed below, presenting to the ED with sudden onset of moderate left sided chest pain one hour prior to admission, not worse with deep inspiration, associated with leg swelling bilaterally, and abdominal distention for about 2 weeks. He reports frothy sputum production . Denies fever, chills, sick contacts. He denies nausea, vomiting, diaphoresis, jaw or left arm pain. He is compliant with his meds and medical appointments. Denies any other symptoms. He continues to smoke,. No ETOH or recreational drug use. No hormonal products. No recent long distance travel. 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 NO new stressors.   ED Course:  BP 100/61   Pulse 94   Temp 98.2 F (36.8 C) (Rectal)   Resp (!) 28   Wt 114.3 kg (252 lb)   SpO2 97%   BMI 34.18 kg/m    CXR with large right pleural effusion. CT angio confirms this large R pl effusion, and possible RML pneumonia.   BNP 591   Last 2 D echo 1 year ago EF 20-25 % with severely reduced systolic function,patient with ICD (last interrogation in 07/2015)    Received Levaquin x1  , and albuterol with deltasone 60 mg at the ER  . Lactic Acid 2-> 1.23   WBC 6.3    Osats in the 80s on admission, now improved on 2 l     Weight  114.3 kg   EKG SR no ACS Troponin 0    Review of Systems: As per HPI otherwise 10 point review of systems negative.   Past Medical History:  Diagnosis Date  . AICD (automatic cardioverter/defibrillator) present   . Barretts syndrome   . Carotid artery disease (HCC)    a. Dopp 09/2011: 60-79% RICA, 40-59% LICA.;  b.  Carotid US (7/14):  Bilateral 60-79% => f/u 6 mos  . Cerebrovascular disease   . Chronic systolic  heart failure (HCC)   . Coronary artery disease    a. AWMI requiring IABP 2005 s/p Horizon study stent-LAD, staged BMS-Cx, DESx2-RCA. b. Last Last LHC (6/06):  EF 25%, pLAD stent 40-50, after stent 40, dLAD 20, pCFX 20, pOM1 70, pRCA 20, mRCA stents ok.=> med Rx;  c.  Myoview (7/14):  EF 26%, large ant, septal, inf and apical infarct, no ischemia.  Med Rx continued  . Diabetes mellitus   . Heart murmur   . History of kidney stones   . HTN (hypertension)   . Hyperlipidemia    mixed  . ICD (implantable cardiac defibrillator), dual, in situ    St. Jude for severe LVD EF 25% 2/07 explanted 2010. Medtronic Virtuoso II DR Dual-chamber cardiverter-defibrillator  with pocket revision, Dr. Graciela Husbands  . Ischemic cardiomyopathy    a. Echo (09/27/12): EF 20%, diffuse HK with periapical AK, no LV thrombus noted, restrictive physiology, trivial MR, mild LAE, RVSF mildly reduced, PASP 39  . Presence of permanent cardiac pacemaker   . PVD (peripheral vascular disease) (HCC)    a. ABI 09/2011 - R normal, L moderate - saw Dr. Kirke Corin - med rx.   Marland Kitchen RIATA ICD Lead--on advisory recall    . Tobacco abuse     Past  Surgical History:  Procedure Laterality Date  . ABDOMINAL AORTAGRAM N/A 11/06/2013   Procedure: ABDOMINAL Ronny Flurry;  Surgeon: Chuck Hint, MD;  Location: [facility] CATH LAB;  Service: Cardiovascular;  Laterality: N/A;  . APPENDECTOMY  2000   ruptured  . FEMORAL-POPLITEAL BYPASS GRAFT Right 11/07/2013   Procedure: Right FEMORAL-POPLITEAL ARTERY Bypass ;  Surgeon: Chuck Hint, MD;  Location: [facility] OR;  Service: Vascular;  Laterality: Right;  . FEMORAL-POPLITEAL BYPASS GRAFT Left 11/17/2014   Procedure: BYPASS GRAFT FEMORAL-POPLITEAL ARTERY USING GORE PROPATEN X 80CM VASCULAR GRAFT;  Surgeon: Chuck Hint, MD;  Location: [facility] OR;  Service: Vascular;  Laterality: Left;  . IMPLANTABLE CARDIOVERTER DEFIBRILLATOR (ICD) GENERATOR CHANGE N/A 12/16/2012   Procedure: ICD GENERATOR CHANGE;   Surgeon: [name], MD;  Location: [facility] CATH LAB;  Service: Cardiovascular;  Laterality: N/A;  . INTRAOPERATIVE ARTERIOGRAM Right 11/07/2013   Procedure: INTRA OPERATIVE ARTERIOGRAM;  Surgeon: Chuck Hint, MD;  Location: [facility] OR;  Service: Vascular;  Laterality: Right;  . LOWER EXTREMITY ANGIOGRAM Bilateral 11/06/2013   Procedure: LOWER EXTREMITY ANGIOGRAM;  Surgeon: Chuck Hint, MD;  Location: [facility] CATH LAB;  Service: Cardiovascular;  Laterality: Bilateral;  . Medtronic Virtuoso II DR dual-chamber cardioverter-defibrillation with pocket revison     Dr. Sherryl Manges  . PERIPHERAL VASCULAR CATHETERIZATION N/A 09/25/2014   Procedure: Abdominal Aortogram;  Surgeon: Sherren Kerns, MD;  Location: [facility] INVASIVE CV LAB;  Service: Cardiovascular;  Laterality: N/A;    Social History Social History   Social History  . Marital status: Single    Spouse name: N/A  . Number of children: N/A  . Years of education: N/A   Occupational History  . 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 Smoking status: Current Every Day Smoker    Packs/day: 1.00    Years: 33.00    Types: Cigarettes  . Smokeless tobacco: Never Used     Comment: smoker for 33 years  . Alcohol use No  . Drug use: No     Comment: former Cocain, Acid, Marijuana - 25 years ago  . Sexual activity: No   Other Topics Concern  . Not on file   Social History Narrative   Divorced.     Allergies  Allergen Reactions  . Niacin Itching and Other (See Comments)    Flushing   . Metformin And Related Nausea And Vomiting    *Only the extended release*    Family History  Problem Relation Age of Onset  . Diabetes Mother   . Coronary artery disease Father   . Heart disease Father     before age 39      Prior to Admission medications   Medication Sig Start Date End Date Taking? Authorizing Provider  aspirin 81 MG tablet Take 1 tablet (81 mg total) by mouth daily. 10/07/12   Yes [name], MD  atorvastatin (LIPITOR) 80 MG tablet Take 1 tablet (80 mg total) by mouth every other day. 08/20/14  Yes Lewayne Bunting, MD  carvedilol (COREG) 6.25 MG tablet Take 1 tablet (6.25 mg total) by mouth 2 (two) times daily with a meal. Patient taking differently: Take 6.25 mg by mouth daily.  04/07/15  Yes Lewayne Bunting, MD  ezetimibe (ZETIA) 10 MG tablet TAKE ONE TABLET BY MOUTH ONCE DAILY 04/26/15  Yes Lewayne Bunting, MD  insulin NPH-regular Human (NOVOLIN 70/30) (70-30) 100 UNIT/ML injection Inject 16-32 Units into  the skin 2 (two) times daily with a meal. 32 units in the morning, 16 units in the evening   Yes Historical Provider, MD  sacubitril-valsartan (ENTRESTO) 24-26 MG Take 1 tablet by mouth 2 (two) times daily. Patient taking differently: Take 1 tablet by mouth daily.  02/04/15  Yes Lewayne Bunting, MD  spironolactone (ALDACTONE) 25 MG tablet Take 0.5 tablets (12.5 mg total) by mouth daily. 03/02/15  Yes Lewayne Bunting, MD  torsemide (DEMADEX) 20 MG tablet TAKE THREE TABLETS BY MOUTH ONCE DAILY 09/02/15  Yes Lewayne Bunting, MD    Physical Exam:    Vitals:   01/05/16 0700 01/05/16 0739 01/05/16 0809 01/05/16 0835  BP: 121/82 121/82 100/61   Pulse: 83 92 94   Resp: 16 (!) 28    Temp:      TempSrc:      SpO2: 94% 99% 97%   Weight:    114.3 kg (252 lb)       Constitutional: NAD, calm, uncomfortable due to respiratory symptoms  Vitals:   01/05/16 0700 01/05/16 0739 01/05/16 0809 01/05/16 0835  BP: 121/82 121/82 100/61   Pulse: 83 92 94   Resp: 16 (!) 28    Temp:      TempSrc:      SpO2: 94% 99% 97%   Weight:    114.3 kg (252 lb)   Eyes: PERRL, lids and conjunctivae normal ENMT: Mucous membranes are moist. Posterior pharynx clear of any exudate or lesions.Normal dentition.  Neck: normal, supple, no masses, no thyromegaly Respiratory: Decreased breath sounds on the right, with rhonchi , no rales on the left. No wheezing. No accessory muscle use    Cardiovascular: Regular rate and rhythm, no murmurs / rubs / gallops. Bilateral 2 + pitting edema. 2+ pedal pulses. No carotid bruits.  Abdomen: no tenderness, no masses palpated. No hepatosplenomegaly. Bowel sounds positive. Minimal fluid wake on the right  Musculoskeletal: no clubbing / cyanosis. No joint deformity upper and lower extremities. Good ROM, no contractures. Normal muscle tone.  Skin: very dry, chronic stasis changes noted, and bilateral chronic dry ulcers withthout drainage or odor  Neurologic: CN 2-12 grossly intact. Sensation intact, DTR normal. Strength 5/5 in all 4.  Psychiatric: Normal judgment and insight. Alert and oriented x 3. Normal mood.     Labs on Admission: I have personally reviewed following labs and imaging studies  CBC:  Recent Labs Lab 01/05/16 0110  WBC 6.3  HGB 12.1*  HCT 36.9*  MCV 84.8  PLT 121*    Basic Metabolic Panel:  Recent Labs Lab 01/05/16 0110  NA 136  K 3.0*  CL 97*  CO2 28  GLUCOSE 72  BUN 16  CREATININE 1.05  CALCIUM 8.4*    GFR: Estimated Creatinine Clearance: 96.5 mL/min (by C-G formula based on SCr of 1.05 mg/dL).  Liver Function Tests: No results for input(s): AST, ALT, ALKPHOS, BILITOT, PROT, ALBUMIN in the last 168 hours. No results for input(s): LIPASE, AMYLASE in the last 168 hours. No results for input(s): AMMONIA in the last 168 hours.  Coagulation Profile: No results for input(s): INR, PROTIME in the last 168 hours.  Cardiac Enzymes: No results for input(s): CKTOTAL, CKMB, CKMBINDEX, TROPONINI in the last 168 hours.  BNP (last 3 results) No results for input(s): PROBNP in the last 8760 hours.  HbA1C: No results for input(s): HGBA1C in the last 72 hours.  CBG: No results for input(s): GLUCAP in the last 168 hours.  Lipid Profile: No  results for input(s): CHOL, HDL, LDLCALC, TRIG, CHOLHDL, LDLDIRECT in the last 72 hours.  Thyroid Function Tests: No results for input(s): TSH, T4TOTAL, FREET4,  T3FREE, THYROIDAB in the last 72 hours.  Anemia Panel: No results for input(s): VITAMINB12, FOLATE, FERRITIN, TIBC, IRON, RETICCTPCT in the last 72 hours.  Urine analysis:    Component Value Date/Time   COLORURINE YELLOW 03/13/2015 0126   APPEARANCEUR CLOUDY (A) 03/13/2015 0126   LABSPEC 1.017 03/13/2015 0126   PHURINE 5.5 03/13/2015 0126   GLUCOSEU >1000 (A) 03/13/2015 0126   HGBUR SMALL (A) 03/13/2015 0126   BILIRUBINUR NEGATIVE 03/13/2015 0126   KETONESUR NEGATIVE 03/13/2015 0126   PROTEINUR NEGATIVE 03/13/2015 0126   UROBILINOGEN 1.0 11/06/2014 0906   NITRITE NEGATIVE 03/13/2015 0126   LEUKOCYTESUR MODERATE (A) 03/13/2015 0126    Sepsis Labs: @LABRCNTIP (procalcitonin:4,lacticidven:4) )No results found for this or any previous visit (from the past 240 hour(s)).   Radiological Exams on Admission: Dg Chest 2 View  Result Date: 01/05/2016 CLINICAL DATA:  Chest pain, cough. History of diabetes, cardiomyopathy/CHF. History of RIGHT thoracentesis. EXAM: CHEST  2 VIEW COMPARISON:  Chest radiograph January 07, 2015 and CT chest January 02, 2015 FINDINGS: Moderate to large RIGHT pleural effusion and underlying consolidation. Mild bronchitic changes. RIGHT heart border obscured. Mildly calcified aortic knob. Single lead LEFT cardiac pacemaker in situ. No pneumothorax. Old RIGHT lateral rib fractures. IMPRESSION: Moderate to large recurrent RIGHT pleural effusion with underlying consolidation, recommend follow up to exclude mass. Mild bronchitic changes. Electronically Signed   By: Awilda Metro M.D.   On: 01/05/2016 02:04   Ct Chest W Contrast  Result Date: 01/05/2016 CLINICAL DATA:  Sudden onset of left-sided chest pain EXAM: CT CHEST WITH CONTRAST TECHNIQUE: Multidetector CT imaging of the chest was performed during intravenous contrast administration. CONTRAST:  75mL ISOVUE-300 IOPAMIDOL (ISOVUE-300) INJECTION 61% COMPARISON:  Chest radiograph 01/05/2016, CT chest 01/02/2015 FINDINGS:  Cardiovascular: Atherosclerotic calcifications within the aorta. No aneurysm. Extensive coronary artery calcifications. Heart size upper normal. Partially visualized cardiac pacing lead. No large pericardial effusion. Mediastinum/Nodes: Stable nonspecific mediastinal lymph nodes. Trachea is midline. Thyroid gland unremarkable. Esophagus is within normal limits. Lungs/Pleura: Large right-sided pleural effusion with consolidation in the right lower lobe and partial consolidation in the right middle lobe. The left lung demonstrates scattered foci of hazy density in the lingula and left lower lobe which may reflect diffuse atelectasis or possibly mild edema. No pneumothorax. No left effusion. Upper Abdomen: Upper abdominal structures show no acute abnormalities. Ascites adjacent to the spleen and liver. Musculoskeletal: No suspicious bone lesions. Old right-sided rib fractures IMPRESSION: 1. Large right-sided pleural effusion with consolidation in the right lower lobe. Partial consolidation in the right middle lobe, cannot rule out a pneumonia. No significant left pleural effusion. Scattered mild densities within the left upper and lower lobes could reflect scattered foci of atelectasis or mild edema. 2. Ascites in the upper abdomen 3. No change in mild mediastinal adenopathy. Electronically Signed   By: Jasmine Pang M.D.   On: 01/05/2016 03:44    EKG: Independently reviewed.  Assessment/Plan Active Problems:   Hyperlipidemia   TOBACCO ABUSE   Coronary atherosclerosis   Cerebrovascular disease   Peripheral vascular disease (HCC)   Hypertension   Automatic implantable cardioverter-defibrillator in [employer]. Jude   Carotid stenosis   Acute on chronic systolic heart failure (HCC)   Type 2 diabetes mellitus (HCC)   Pleural effusion   Chronic systolic CHF (congestive heart failure) (HCC)   Community acquired pneumonia  Acute hypoxic respiratory failure likely secondary to acute on chronic systolic  CHF  exacerbation and possible pneumonia (CAP) and right pleural effusion .  CXR with large right pleural effusion. CT angio confirms this large R pl effusion, and possible RML pneumonia.  EKG and Tn negative , BNP 591  Last 2 D echo 1 year ago EF 20-25 % with severely reduced systolic function, patient with ICD (last interrogation in 07/2015)   Received Levaquin x1  , and albuterol with deltasone 60 mg at the ER . Lactic Acid 2-> 1.23   WBC 6.3   Osats in the 80s on admission, now improved on 2 l    VSS  Weight  114.3 kg   -  Telemetry CHForder set, pneumonia order set -  Cycle troponins -  Daily weights and strict I/O -  2D ECHO  Continue Coreg, Aldactone, Demadex Entresto  -  Lasix  120 mg now and will reassess  Continue Levaquin daily  Influenza panel Legionella, Strep pneumonia  UA, Sputum and blood Cultures  Continue nebs prn . Will hold steroids in view of possible thoracentesis   Left sided chest pain unlikely of cardiac nature  Plan as above with antibiotics and supportive therapy IR consult for R thoracentesis of the large right pleural effusion   Tobacco abuse with nicotine withdrawal -  Nicotine patch was offered, patient declined    Counseled cessation   Hypertension BP 100/61   Pulse 94   Controlled Hold  home anti-hypertensive medications   Hyperlipidemia Continue home statins  Type II Diabetes Current blood sugar level is 72 Lab Results  Component Value Date   HGBA1C 9.6 (H) 11/17/2014  Hgb A1C Hold home oral diabetic medications.  Lantus , SSI Heart healthy carb modified diet.    DVT prophylaxis: Lovenox  Code Status:   Full     Family Communication:  Discussed with patient Disposition Plan: Expect patient to be discharged to home after condition improves Consults called:    None Admission status:Tele  Obs    Dalton Malinoski E, PA-C [facility]   01/05/2016, 9:48 AM

## 2016-01-05 NOTE — ED Notes (Signed)
Pt ambulated to restroom located in room, tolerated well. Pt back in bed eating breakfast.

## 2016-01-05 NOTE — ED Notes (Signed)
Carb Modified ordered for patient.

## 2016-01-05 NOTE — Procedures (Signed)
PROCEDURE:  Successful US guided right thoracentesis. Yielded 2 liters of clear yellow fluid. Pt tolerated procedure well. No immediate complications.  Specimen was sent for labs. CXR ordered.  Post procedure US revealed moderate pleural effusion. Recommend repeat Right Thoracentesis tomorrow  Gwynneth MacleodWENDY S Makoa Satz PA-C 01/05/2016 10:18 AM

## 2016-01-05 NOTE — ED Notes (Signed)
RN gave report to POD C RN

## 2016-01-06 ENCOUNTER — Inpatient Hospital Stay (HOSPITAL_COMMUNITY): Payer: BLUE CROSS/BLUE SHIELD

## 2016-01-06 DIAGNOSIS — I6523 Occlusion and stenosis of bilateral carotid arteries: Secondary | ICD-10-CM

## 2016-01-06 DIAGNOSIS — I2510 Atherosclerotic heart disease of native coronary artery without angina pectoris: Secondary | ICD-10-CM

## 2016-01-06 DIAGNOSIS — I5023 Acute on chronic systolic (congestive) heart failure: Secondary | ICD-10-CM

## 2016-01-06 DIAGNOSIS — I509 Heart failure, unspecified: Secondary | ICD-10-CM

## 2016-01-06 DIAGNOSIS — Z95810 Presence of automatic (implantable) cardiac defibrillator: Secondary | ICD-10-CM

## 2016-01-06 DIAGNOSIS — I10 Essential (primary) hypertension: Secondary | ICD-10-CM

## 2016-01-06 DIAGNOSIS — I255 Ischemic cardiomyopathy: Secondary | ICD-10-CM | POA: Diagnosis present

## 2016-01-06 LAB — PH, BODY FLUID: pH, Body Fluid: 7.8

## 2016-01-06 LAB — URINALYSIS, ROUTINE W REFLEX MICROSCOPIC
BACTERIA UA: NONE SEEN
BILIRUBIN URINE: NEGATIVE
Glucose, UA: NEGATIVE mg/dL
KETONES UR: NEGATIVE mg/dL
Nitrite: NEGATIVE
PH: 5 (ref 5.0–8.0)
Protein, ur: NEGATIVE mg/dL
Specific Gravity, Urine: 1.028 (ref 1.005–1.030)

## 2016-01-06 LAB — COMPREHENSIVE METABOLIC PANEL
ALBUMIN: 2.9 g/dL — AB (ref 3.5–5.0)
ALK PHOS: 108 U/L (ref 38–126)
ALT: 15 U/L — ABNORMAL LOW (ref 17–63)
AST: 27 U/L (ref 15–41)
Anion gap: 12 (ref 5–15)
BILIRUBIN TOTAL: 1.4 mg/dL — AB (ref 0.3–1.2)
BUN: 25 mg/dL — ABNORMAL HIGH (ref 6–20)
CALCIUM: 8.5 mg/dL — AB (ref 8.9–10.3)
CO2: 26 mmol/L (ref 22–32)
Chloride: 96 mmol/L — ABNORMAL LOW (ref 101–111)
Creatinine, Ser: 1.32 mg/dL — ABNORMAL HIGH (ref 0.61–1.24)
GFR calc Af Amer: >60 mL/min (ref >60)
GFR, EST NON AFRICAN AMERICAN: 57 mL/min — AB (ref >60)
GLUCOSE: 139 mg/dL — AB (ref 65–99)
POTASSIUM: 3.4 mmol/L — AB (ref 3.5–5.1)
Sodium: 134 mmol/L — ABNORMAL LOW (ref 135–145)
TOTAL PROTEIN: 7.1 g/dL (ref 6.5–8.1)

## 2016-01-06 LAB — EXPECTORATED SPUTUM ASSESSMENT W GRAM STAIN, RFLX TO RESP C

## 2016-01-06 LAB — ECHOCARDIOGRAM COMPLETE
HEIGHTINCHES: 72 in
WEIGHTICAEL: 3875.2 [oz_av]

## 2016-01-06 LAB — GLUCOSE, CAPILLARY
GLUCOSE-CAPILLARY: 135 mg/dL — AB (ref 65–99)
GLUCOSE-CAPILLARY: 144 mg/dL — AB (ref 65–99)
Glucose-Capillary: 137 mg/dL — ABNORMAL HIGH (ref 65–99)

## 2016-01-06 LAB — EXPECTORATED SPUTUM ASSESSMENT W REFEX TO RESP CULTURE

## 2016-01-06 LAB — HEMOGLOBIN A1C
Hgb A1c MFr Bld: 7.6 % — ABNORMAL HIGH (ref 4.8–5.6)
Mean Plasma Glucose: 171 mg/dL

## 2016-01-06 LAB — INFLUENZA PANEL BY PCR (TYPE A & B)
INFLAPCR: NEGATIVE
INFLBPCR: NEGATIVE

## 2016-01-06 LAB — MISC LABCORP TEST (SEND OUT): LABCORP TEST CODE: 88062

## 2016-01-06 LAB — STREP PNEUMONIAE URINARY ANTIGEN: Strep Pneumo Urinary Antigen: NEGATIVE

## 2016-01-06 LAB — TRIGLYCERIDES, BODY FLUIDS: TRIGLYCERIDES FL: 37 mg/dL

## 2016-01-06 LAB — TROPONIN I
Troponin I: <0.03 ng/mL (ref <0.03)
Troponin I: <0.03 ng/mL (ref <0.03)

## 2016-01-06 LAB — HEPARIN ANTI-XA

## 2016-01-06 LAB — MRSA PCR SCREENING: MRSA BY PCR: NEGATIVE

## 2016-01-06 LAB — LACTATE DEHYDROGENASE: LDH: 175 U/L (ref 98–192)

## 2016-01-06 MED ORDER — ENOXAPARIN SODIUM 40 MG/0.4ML ~~LOC~~ SOLN
40.0000 mg | SUBCUTANEOUS | Status: DC
  Administered 2016-01-06: 40 mg via SUBCUTANEOUS
  Filled 2016-01-06: qty 0.4

## 2016-01-06 MED ORDER — FUROSEMIDE 10 MG/ML IJ SOLN
120.0000 mg | Freq: Three times a day (TID) | INTRAMUSCULAR | Status: DC
  Administered 2016-01-06 – 2016-01-09 (×8): 120 mg via INTRAVENOUS
  Filled 2016-01-06 (×8): qty 12

## 2016-01-06 MED ORDER — SODIUM CHLORIDE 0.9% FLUSH
3.0000 mL | Freq: Two times a day (BID) | INTRAVENOUS | Status: DC
  Administered 2016-01-06: 3 mL via INTRAVENOUS

## 2016-01-06 MED ORDER — INSULIN GLARGINE 100 UNIT/ML ~~LOC~~ SOLN
12.0000 [IU] | Freq: Every day | SUBCUTANEOUS | Status: DC
  Administered 2016-01-07 – 2016-01-10 (×4): 12 [IU] via SUBCUTANEOUS
  Filled 2016-01-06 (×6): qty 0.12

## 2016-01-06 MED ORDER — LEVOFLOXACIN IN D5W 750 MG/150ML IV SOLN
750.0000 mg | INTRAVENOUS | Status: AC
  Administered 2016-01-07 – 2016-01-11 (×5): 750 mg via INTRAVENOUS
  Filled 2016-01-06 (×6): qty 150

## 2016-01-06 MED ORDER — SODIUM CHLORIDE 0.9 % IV SOLN
INTRAVENOUS | Status: DC
  Administered 2016-01-07: 06:00:00 via INTRAVENOUS

## 2016-01-06 MED ORDER — SODIUM CHLORIDE 0.9 % IV SOLN: 250.0000 mL | INTRAVENOUS | Status: DC | PRN

## 2016-01-06 MED ORDER — SODIUM CHLORIDE 0.9% FLUSH: 3.0000 mL | INTRAVENOUS | Status: DC | PRN

## 2016-01-06 MED ORDER — DOBUTAMINE IN D5W 4-5 MG/ML-% IV SOLN
2.5000 ug/kg/min | INTRAVENOUS | Status: DC
  Administered 2016-01-06 – 2016-01-08 (×2): 2.5 ug/kg/min via INTRAVENOUS
  Filled 2016-01-06 (×4): qty 250

## 2016-01-06 MED ORDER — POTASSIUM CHLORIDE CRYS ER 20 MEQ PO TBCR
40.0000 meq | EXTENDED_RELEASE_TABLET | Freq: Every day | ORAL | Status: DC
  Administered 2016-01-06 – 2016-01-08 (×3): 40 meq via ORAL
  Filled 2016-01-06 (×3): qty 2

## 2016-01-06 MED ORDER — ASPIRIN 81 MG PO CHEW
81.0000 mg | CHEWABLE_TABLET | ORAL | Status: AC
  Administered 2016-01-07: 81 mg via ORAL
  Filled 2016-01-06: qty 1

## 2016-01-06 MED ORDER — FUROSEMIDE 10 MG/ML IJ SOLN
80.0000 mg | Freq: Three times a day (TID) | INTRAMUSCULAR | Status: DC
  Administered 2016-01-06 (×2): 80 mg via INTRAVENOUS
  Filled 2016-01-06 (×2): qty 8

## 2016-01-06 NOTE — Progress Notes (Signed)
Notified that patient had 5 wide QRS complexes by CCMD. Patient stable. Will continue to monitor

## 2016-01-06 NOTE — Consult Note (Signed)
Cardiology Consult    Patient ID: CAS TRACZ MRN: [id number], DOB/AGE: 05/29/1955   Admit date: 01/05/2016 Date of Consult: 01/06/2016  Primary Physician: [name], MD Reason for Consult: Heart failure Primary Cardiologist: Dr. Jens Som Requesting Provider: Dr. Jerral Ralph   History of Present Illness    [name] 61 y.o. male with a previous medical history of ischemic heart disease and systolic CHF, S/P anterior MI in 2005 requiring IABP and Horizon stent to the LAD, BMS to CFX and DES X 2 to RCA, AICD (generator change out 12/14), PAD S/P fem-pop bypass with prosthetic graft 11/2014, Carotid artery stenosis (60-79% right, 40-59% left 08/2014), HTN, DM type II, HLD, Barrett's esophagus who presented to the [facility] ED for evaluation of worsening shortness of breath and left sided chest pain. He was found to be fluid overloaded with moderate pleural effusion. He is active with [facility] and we have been asked to consult for his acute on chronic systolic heart failure.  The patient has been experiencing progressive shortness of breath, edema and weight gain over the last month or so. 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 His home meds include carvedilol 6.25 mg, Entresto 24-26 mg, spironolactone 25 mg, and torsemide 60 mg daily, however, he reports that he is not consistent with taking his meds. 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 He has increased the torsemide to 120 mg on most days over the last 2 weeks in response to the edema and he is consistent with his spironolactone. The patient does not weight daily. He thinks his baseline weight is about 225-230 pounds.  He had intermittent  sharp stabbing left sided chest pain on admission, now resolved. No substernal chest pressure. EKG without acute ischemic changes and troponins negative.  The patient was last seen in the office By Dr Graciela Husbands, EP, on 04/16/15 who found device to be functioning normally and deferred upgrade to CRT at that time due to relatively narrow IVCD.  He was seen last in the office by Dr. Jens Som on 04/05/2015, BP was 92/54 and wt 199. He had some DOE but no orthopnea, PND, pedal edema, exertional chest pain or syncope. 6 month follow up was recommended but not done.  Historically the patient was hosp for CHF with EF 25 % in 11/2013; Last myoview was in 07/2012 with large, severe, fixed anterior, septal, inferior and apical infarct; no ischemia, EF 26%. He was hospitalized in 01/2015 for congestive heart failure and diuresed, EF was 20-25%.   Last heart cath was in 06/2004 with EF 25%, Prox PAD stent 40-50%, after stent 40%, distal LAD 20%, proximal CFX 20%, Proximal OM1 70%, Proxiaml RCA 20%, mid RCA stents OK.   Past Medical History   Past Medical History:  Diagnosis Date  . AICD (automatic cardioverter/defibrillator) present   . Barretts syndrome   . Carotid artery disease (HCC)    a. Dopp 09/2011: 60-79% RICA, 40-59% LICA.;  b.  Carotid US (7/14):  Bilateral 60-79% => f/u 6 mos  . Cerebrovascular disease   . Chronic systolic heart failure (HCC)   . Coronary artery disease    a. AWMI requiring IABP 2005 s/p Horizon study  stent-LAD, staged BMS-Cx, DESx2-RCA. b. Last Last LHC (6/06):  EF 25%, pLAD stent 40-50, after stent 40, dLAD 20, pCFX 20, pOM1 70, pRCA 20, mRCA stents ok.=> med Rx;  c.  Myoview (7/14):  EF 26%, large ant, septal, inf and apical infarct, no ischemia.  Med Rx continued  . Diabetes mellitus   . Heart murmur   . History of kidney stones   . HTN (hypertension)   . Hyperlipidemia    mixed  . ICD (implantable cardiac defibrillator), dual, in situ    St. Jude for severe LVD EF 25% 2/07  explanted 2010. Medtronic Virtuoso II DR Dual-chamber cardiverter-defibrillator  with pocket revision, Dr. Graciela Husbands  . Ischemic cardiomyopathy    a. Echo (09/27/12): EF 20%, diffuse HK with periapical AK, no LV thrombus noted, restrictive physiology, trivial MR, mild LAE, RVSF mildly reduced, PASP 39  . Pleural effusion 01/05/2016  . Presence of permanent cardiac pacemaker   . PVD (peripheral vascular disease) (HCC)    a. ABI 09/2011 - R normal, L moderate - saw Dr. Kirke Corin - med rx.   Marland Kitchen RIATA ICD Lead--on advisory recall    . Tobacco abuse     Past Surgical History:  Procedure Laterality Date  . ABDOMINAL AORTAGRAM N/A 11/06/2013   Procedure: ABDOMINAL Ronny Flurry;  Surgeon: Chuck Hint, MD;  Location: [facility] CATH LAB;  Service: Cardiovascular;  Laterality: N/A;  . APPENDECTOMY  2000   ruptured  . FEMORAL-POPLITEAL BYPASS GRAFT Right 11/07/2013   Procedure: Right FEMORAL-POPLITEAL ARTERY Bypass ;  Surgeon: Chuck Hint, MD;  Location: [facility] OR;  Service: Vascular;  Laterality: Right;  . FEMORAL-POPLITEAL BYPASS GRAFT Left 11/17/2014   Procedure: BYPASS GRAFT FEMORAL-POPLITEAL ARTERY USING GORE PROPATEN X 80CM VASCULAR GRAFT;  Surgeon: Chuck Hint, MD;  Location: [facility] OR;  Service: Vascular;  Laterality: Left;  . IMPLANTABLE CARDIOVERTER DEFIBRILLATOR (ICD) GENERATOR CHANGE N/A 12/16/2012   Procedure: ICD GENERATOR CHANGE;  Surgeon: [name], MD;  Location: [facility] CATH LAB;  Service: Cardiovascular;  Laterality: N/A;  . INTRAOPERATIVE ARTERIOGRAM Right 11/07/2013   Procedure: INTRA OPERATIVE ARTERIOGRAM;  Surgeon: Chuck Hint, MD;  Location: [facility] OR;  Service: Vascular;  Laterality: Right;  . LOWER EXTREMITY ANGIOGRAM Bilateral 11/06/2013   Procedure: LOWER EXTREMITY ANGIOGRAM;  Surgeon: Chuck Hint, MD;  Location: [facility] CATH LAB;  Service: Cardiovascular;  Laterality: Bilateral;  . Medtronic Virtuoso II DR dual-chamber cardioverter-defibrillation with pocket  revison     Dr. Sherryl Manges  . PERIPHERAL VASCULAR CATHETERIZATION N/A 09/25/2014   Procedure: Abdominal Aortogram;  Surgeon: Sherren Kerns, MD;  Location: [facility] INVASIVE CV LAB;  Service: Cardiovascular;  Laterality: N/A;  . US GUIDED THORACENTESIS RIGHT ([facility] HX) Right 01/05/2016     Allergies  Allergies  Allergen Reactions  . Niacin Itching and Other (See Comments)    Flushing   . Metformin And Related Nausea And Vomiting    *Only the extended release*    Inpatient Medications    . aspirin EC  81 mg Oral Daily  . atorvastatin  80 mg Oral QODAY  . carvedilol  6.25 mg Oral Daily  . enoxaparin (LOVENOX) injection  40 mg Subcutaneous Q24H  . ezetimibe  10 mg Oral Daily  . furosemide  80 mg Intravenous Q8H  . insulin aspart  0-9 Units Subcutaneous TID WC  . insulin glargine  12 Units Subcutaneous QHS  . [START ON 01/07/2016] levofloxacin (LEVAQUIN) IV  750 mg Intravenous Q24H  . sacubitril-valsartan  1 tablet  Oral BID  . sodium chloride flush  3 mL Intravenous Q12H  . spironolactone  12.5 mg Oral Daily    Family History    Family History  Problem Relation Age of Onset  . Diabetes Mother   . Coronary artery disease Father   . Heart disease Father     before age 85    Social History    Social History   Social History  . Marital status: Divorced    Spouse name: N/A  . Number of children: N/A  . Years of education: N/A   Occupational History  . 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 Smoking status: Current Every Day Smoker    Packs/day: 1.00    Years: 45.00    Types: Cigarettes  . Smokeless tobacco: Never Used  . Alcohol use No  . Drug use: No     Comment: former Cocain, Acid, Marijuana - 25 years ago  . Sexual activity: No   Other Topics Concern  . Not on file   Social History Narrative   Divorced.     Review of Systems   General:  Increased weight and feeling poorly Cardiovascular:  Sharp stabbing left  chest pain on admission now resolved, positive for dyspnea on exertion, edema, orthopnea negative for palpitations, paroxysmal nocturnal dyspnea. Dermatological: Dry skin on legs Respiratory: Positive for DOE and cough Urologic: No hematuria, dysuria Abdominal:   Positive for abdominal bloating and tightness, poor appetite Neurologic:  No visual changes,  changes in mental status. All other systems reviewed and are otherwise negative except as noted above.  Physical Exam    Blood pressure (!) 98/56, pulse 87, temperature 98.7 F (37.1 C), temperature source Oral, resp. rate 20, height 6' (1.829 m), weight 242 lb 3.2 oz (109.9 kg), SpO2 95 %.  General: Chronically ill appearing caucasian male, NAD Psych: Normal affect. Neuro: Alert and oriented X 3. Moves all extremities spontaneously. HEENT: Normal  Neck: Supple without bruits, JVD present Lungs:  Resp regular and unlabored at rest, Lungs with fine crackles and faint moist wheezes in LLL Heart:  Irregularly irregular rhythm, 2/6 systolic murmur best appreciated in the LUSB region Abdomen: Tight,  BS + x 4.  Extremities: 1+ lower leg edema. Diminished pedal pulses. Dry flaky skin on lower legs with multiple scabs bilaterally and dry ulcer on anterior left lower leg  Labs    Troponin (Point of Care Test)  Recent Labs  01/05/16 0128  TROPIPOC 0.00    Recent Labs  01/05/16 1913 01/06/16 0004 01/06/16 0336 01/06/16 1042  TROPONINI <0.03 <0.03 <0.03 <0.03   Lab Results  Component Value Date   WBC 6.3 01/05/2016   HGB 12.1 (L) 01/05/2016   HCT 36.9 (L) 01/05/2016   MCV 84.8 01/05/2016   PLT 121 (L) 01/05/2016    Recent Labs Lab 01/06/16 1042  NA 134*  K 3.4*  CL 96*  CO2 26  BUN 25*  CREATININE 1.32*  CALCIUM 8.5*  PROT 7.1  BILITOT 1.4*  ALKPHOS 108  ALT 15*  AST 27  GLUCOSE 139*   Lab Results  Component Value Date   CHOL 110 01/12/2014   HDL 47 01/12/2014   LDLCALC 53 01/12/2014   TRIG 52 01/12/2014    No results found for: [facility]   Radiology Studies    Dg Chest 1 View  Result Date: 01/05/2016 CLINICAL DATA:  Post right thoracentesis. EXAM: CHEST 1 VIEW COMPARISON:  01/05/2016 FINDINGS: Moderate right pleural effusion with right lower lobe atelectasis or infiltrate. No pneumothorax following thoracentesis. Mild cardiomegaly. Left AICD is unchanged. Left lung is clear. IMPRESSION: Moderate right pleural effusion with right lower lobe atelectasis or infiltrate. No pneumothorax. Electronically Signed   By: Charlett NoseKevin  Dover M.D.   On: 01/05/2016 10:38   Dg Chest 2 View  Result Date: 01/06/2016 CLINICAL DATA:  Three weeks of coughing and chest pain. History of CHF, community-acquired pneumonia, pleural effusions, and coronary artery disease. The patient has a permanent pacemaker defibrillator. EXAM: CHEST  2 VIEW COMPARISON:  Portable chest x-ray of January 05, 2016 FINDINGS: There remains increased density in the mid and lower right lung compatible with pleural fluid layering posteriorly. There is a tiny left pleural effusion. The left lung is well-expanded. There are coarse lung markings posteriorly in the right lower lung. The interstitial markings are increased bilaterally. The cardiac silhouette is enlarged. The pulmonary vascularity is engorged. The ICD is in stable position. There is calcification in the wall of the aortic arch. IMPRESSION: There is a moderate-sized right pleural effusion and trace left pleural effusion. There is pulmonary interstitial edema secondary to CHF. There may be right basilar atelectasis or pneumonia. There has been slight interval deterioration in the appearance of the pulmonary interstitium bilaterally since yesterday's study. Thoracic aortic atherosclerosis. Electronically Signed   By: David  SwazilandJordan M.D.   On: 01/06/2016 08:05   Dg Chest 2 View  Result Date: 01/05/2016 CLINICAL DATA:  Chest pain, cough. History of diabetes, cardiomyopathy/CHF. History of RIGHT  thoracentesis. EXAM: CHEST  2 VIEW COMPARISON:  Chest radiograph January 07, 2015 and CT chest January 02, 2015 FINDINGS: Moderate to large RIGHT pleural effusion and underlying consolidation. Mild bronchitic changes. RIGHT heart border obscured. Mildly calcified aortic knob. Single lead LEFT cardiac pacemaker in situ. No pneumothorax. Old RIGHT lateral rib fractures. IMPRESSION: Moderate to large recurrent RIGHT pleural effusion with underlying consolidation, recommend follow up to exclude mass. Mild bronchitic changes. Electronically Signed   By: Awilda Metroourtnay  Bloomer M.D.   On: 01/05/2016 02:04   Ct Chest W Contrast  Result Date: 01/05/2016 CLINICAL DATA:  Sudden onset of left-sided chest pain EXAM: CT CHEST WITH CONTRAST TECHNIQUE: Multidetector CT imaging of the chest was performed during intravenous contrast administration. CONTRAST:  75mL ISOVUE-300 IOPAMIDOL (ISOVUE-300) INJECTION 61% COMPARISON:  Chest radiograph 01/05/2016, CT chest 01/02/2015 FINDINGS: Cardiovascular: Atherosclerotic calcifications within the aorta. No aneurysm. Extensive coronary artery calcifications. Heart size upper normal. Partially visualized cardiac pacing lead. No large pericardial effusion. Mediastinum/Nodes: Stable nonspecific mediastinal lymph nodes. Trachea is midline. Thyroid gland unremarkable. Esophagus is within normal limits. Lungs/Pleura: Large right-sided pleural effusion with consolidation in the right lower lobe and partial consolidation in the right middle lobe. The left lung demonstrates scattered foci of hazy density in the lingula and left lower lobe which may reflect diffuse atelectasis or possibly mild edema. No pneumothorax. No left effusion. Upper Abdomen: Upper abdominal structures show no acute abnormalities. Ascites adjacent to the spleen and liver. Musculoskeletal: No suspicious bone lesions. Old right-sided rib fractures IMPRESSION: 1. Large right-sided pleural effusion with consolidation in the right  lower lobe. Partial consolidation in the right middle lobe, cannot rule out a pneumonia. No significant left pleural effusion. Scattered mild densities within the left upper and lower lobes could reflect scattered foci of atelectasis or mild edema. 2. Ascites in the upper abdomen 3. No change in mild mediastinal adenopathy. Electronically Signed   By: Adrian ProwsKim  Fujinaga M.D.  On: 01/05/2016 03:44   US Thoracentesis Asp Pleural Space W/img Guide  Result Date: 01/05/2016 INDICATION: Congestive heart failure. History of right pleural effusion one year ago. Right chest pain. Large right pleural effusion. Request for diagnostic and therapeutic thoracentesis. EXAM: ULTRASOUND GUIDED RIGHT THORACENTESIS MEDICATIONS: 1% Lidocaine = 10 ml. COMPLICATIONS: None immediate. PROCEDURE: An ultrasound guided thoracentesis was thoroughly discussed with the patient and questions answered. The benefits, risks, alternatives and complications were also discussed. The patient understands and wishes to proceed with the procedure. Written consent was obtained. Ultrasound was performed to localize and mark an adequate pocket of fluid in the right chest. The area was then prepped and draped in the normal sterile fashion. 1% Lidocaine was used for local anesthesia. Under ultrasound guidance a 6 Fr Safe-T-Centesis catheter was introduced. Thoracentesis was performed. The catheter was removed and a dressing applied. FINDINGS: A total of approximately 2 liters of clear yellow fluid was removed. Samples were sent to the laboratory as requested by the clinical team. IMPRESSION: Successful ultrasound guided right thoracentesis yielding 2 liters of pleural fluid. Post procedure ultrasound revealed remaining moderate effusion. Recommend repeat thoracentesis tomorrow. Read by:  Corrin Parker, PA-C Electronically Signed   By: Judie Petit.  Shick M.D.   On: 01/05/2016 10:23    EKG & Cardiac Imaging    EKG: Sinus rhythm with intraventricular conduction delay, 80  bpm, Q waves laterally, no acute ischemic changes, similar to previous readings  Tele: Sinus rhythm with frequent PVC's and NSVT 3-5 beats, rates 80's  Echocardiogram:  01/03/2015 (prior to admission) Study Conclusions - Left ventricle: The cavity size was normal. Wall thickness was   normal. Systolic function was severely reduced. The estimated   ejection fraction was in the range of 20% to 25%. Diffuse   hypokinesis with inferior akinesis. The study is not technically   sufficient to allow evaluation of LV diastolic function. - Mitral valve: Thickening and tethering of the posterior leaflet.   There is mild regurgitation. - Left atrium: The atrium was mildly dilated at 35 ml/m2. - Right ventricle: The cavity size was moderately dilated. Pacer   wire or catheter noted in right ventricle. Systolic function is   reduced. - Right atrium: The atrium was mildly dilated. Pacer wire or   catheter noted in right atrium. - Tricuspid valve: There was moderate regurgitation. - Pulmonary arteries: PA peak pressure: 50 mm Hg (S). - Inferior vena cava: The vessel was dilated. The respirophasic   diameter changes were blunted (< 50%), consistent with elevated   central venous pressure.  Impressions: - Compared to a prior study in 2015, the EF is unchanged at 20-25%   - inferior akinesis. Pacing or AICD wires are now noted.  Assessment & Plan    1. Acute on chronic systolic heart failure -Admitted with increased shortness of breath and weight gain 252 lb (baseline 205) -Hx of ischemic CM and CAD with multiple stents in 2005, EF 20-25% with ICD in place. -Recent Echo on 01/03/2016 EF 20-25%, unchanged from previous study, inferior akinesis, mod TR, LA mildly dilated, mod dilated RV, dilated IVC, study not sufficient to assess diastolic function -Home meds include carvedilol 6.25 mg bid, Entresto 24-26 mg bid, spironolactone 12.5 mg daily, and torsemide 60 mg daily. But pt has been inconsistent with  meds, skipping carvedilol and holding Entresto due to lightheadedness. Has been taking torsemide 120 mg on most days over the last 2 weeks. -Troponins negative X 4, BNP 591.1 -Home meds have been continued. Carvedilol  was held this morning due to hypotension. Entresto, lasix and spironolactone were given. -Moderate right pleural effusion, thoracentesis on 01/05/16 yielding 2 liters -Received lasix 120 mg X1 then Lasix 80 IV TID with poor output, I&O -570 since admission, (-2 liters with thoracentesis) -Creatinine on admission 1.05 (baseline), increase to 1.32 on 1/4 with diuresis (pt received torsemide as well as lasix yesterday) -K+ 3.0 on 1/3, 3.4 on 1/4-without repletion, spironolactone may be benefiting K+ -SBP 83-105 -Weights 252 (1/3 am), 243 (1/3 pm), 242 (1/4) -Tele shows frequent PVC's- will check magnesium given heavy doses of diuretics -Advise continuing current lasix dosing. Would add metolazone, except for kidney function  -No current need for further ischemic workup  2. Acute hypoxic respiratory failure -Multifactorial- acute on chronic systolic heart failure, CAP, pleural effusion -thoracentesis done, diuresing, IV antibiotics per IM, oxygen supplementation and supportive care  3. Left sided chest pain -Was intermittent sharp atypical left side chest pain on admission, now resolved. No substernal chest pain. -EKG without ischemic changes, troponins negative  4. DM Type II -Hgb A1c 7.6 on 01/05/2016 -Management per IM  5. Dyslipidemia -Continue lipitor and zetia  6. Smoking -continues to smoke 1 PPD and not motivated to quit. States "It doesn't really matter anymore".   SignedBerton Bon, NP-C 01/06/2016, 1:05 PM Pager: [phone]

## 2016-01-06 NOTE — Progress Notes (Signed)
OT Cancellation Note  Patient Details Name: [name] MRN: [id number] DOB: August 16, 1955   Cancelled Treatment:    Reason Eval/Treat Not Completed: Patient declined, no reason specified. Pt reports "I feel sick to my stomach" "Im just not up for it today". Will follow up for OT eval as time allows.  Gaye AlkenBailey A Sanyla Summey M.S., OTR/L Pager: [phone]  01/06/2016, 1:43 PM

## 2016-01-06 NOTE — Progress Notes (Signed)
PT Cancellation Note  Patient Details Name: Lynda RainwaterLarry D Herling MRN: [id number] DOB: [birth year]   Cancelled Treatment:    Reason Eval/Treat Not Completed: Patient declined stating he just felt too poorly.  Pt did not even open eyes to therapist.  Encouraged to sit up for lunch that just arrived, but declined.  Will check back as schedule permits.   Jilleen Essner LUBECK 01/06/2016, 11:42 AM

## 2016-01-06 NOTE — Progress Notes (Signed)
Attempted report to 4N x1.  Minerva Endsiffany N Shion Bluestein RN

## 2016-01-06 NOTE — Progress Notes (Signed)
Attempted report x2.  Minerva Endsiffany N Graycen Degan RN

## 2016-01-06 NOTE — Progress Notes (Signed)
PROGRESS NOTE        PATIENT DETAILS Name: [name] Age: 61 y.o. Sex: male Date of Birth: February 03, 1955 Admit Date: 01/05/2016 Admitting Physician Eduard Clos, MD NWG:[id number],YQMV Larita Fife, MD  Brief Narrative: Patient is a 61 y.o. male with hx of ischemic cardiomyopathy (last EF 20-25 % on 01/03/15) presented with worsening SOB and left sided chest pain. Found to have volume overload (weight243 lb) and moderate pleural effusion-underwent thoracontesis on 1/4. See below for details.   Subjective: Still SOB with minimal activity-has pitting edema in his thighs  Assessment/Plan: Acute Hypoxemic Resp Failure:multifactorial etiology-due to acute on chronic systolic CHF, CAP, pleural effusion. Continue diuresis, IV Abx and supportive care. Taper off O2 as tolerated  Acute on Chronic Systolic CHF (last EF 20-25 % on 01/03/15): has pitting edema in his thighs-weight up to around 243 lbs-from chart review baseline dry weight appears to be around 203-205 lbs. Restart Lasix 80 mg IV TID. Continue Coreg, Aldactone and Entresto, follow lytes, I&O's, daily weights. Cards consulted.   Left sided Chest Pain: likely atypical-mostly with coughing. Trop negative. Continue ASA, Coreg, and statin  Right Pleural Effusion: suspect this is transudative-await LDH-maximize diuretics-before proceeding with repeat thora. Follow cytology-+smoker  CAP:CT chest confirms consolidation in the right middle lobe-continue Levaquin. Influenza PCR negative, blood cultures pending. Follow  Hx of CAD (MI 2005 LAD stent W/ BMS CFX, DES x 2 RCA):continue ASA,Coreg and statin. Trop negative-chest pain thought to be atypical. Cards consulted   Hx of PVD-s/p L fem-pop 11/2014:continue ASA/Statin  Ischemic cardiomyopathy-s/p St. Jude ICD  DM-2:CBG's stable but on the higher side-start Lantus 12 units-continue SSI follow  Dyslipidemia:Continue Lipitor and Zetia  Tobacco Abuse: counseled  DVT  Prophylaxis: Prophylactic Lovenox   Code Status: Full code   Family Communication: None at bedside  Disposition Plan: Remain inpatient-will require atleast 2-3 more days of hospitalization  Antimicrobial agents: Anti-infectives    Start     Dose/Rate Route Frequency Ordered Stop   01/07/16 0600  levofloxacin (LEVAQUIN) IVPB 750 mg     750 mg 100 mL/hr over 90 Minutes Intravenous Every 24 hours 01/06/16 0826     01/06/16 0600  levofloxacin (LEVAQUIN) IVPB 500 mg  Status:  Discontinued     500 mg 100 mL/hr over 60 Minutes Intravenous Every 24 hours 01/05/16 0811 01/06/16 0826   01/05/16 0300  levofloxacin (LEVAQUIN) IVPB 500 mg     500 mg 100 mL/hr over 60 Minutes Intravenous  Once 01/05/16 0249 01/05/16 0604      Procedures: None  CONSULTS:  cardiology  Time spent: 25- minutes-Greater than 50% of this time was spent in counseling, explanation of diagnosis, planning of further management, and coordination of care.  MEDICATIONS: Scheduled Meds: . aspirin EC  81 mg Oral Daily  . atorvastatin  80 mg Oral QODAY  . carvedilol  6.25 mg Oral Daily  . ezetimibe  10 mg Oral Daily  . furosemide  80 mg Intravenous Q8H  . insulin aspart  0-9 Units Subcutaneous TID WC  . [START ON 01/07/2016] levofloxacin (LEVAQUIN) IV  750 mg Intravenous Q24H  . sacubitril-valsartan  1 tablet Oral BID  . sodium chloride flush  3 mL Intravenous Q12H  . spironolactone  12.5 mg Oral Daily   Continuous Infusions: PRN Meds:.sodium chloride, acetaminophen, guaiFENesin-dextromethorphan, ipratropium-albuterol, ondansetron (ZOFRAN) IV   PHYSICAL EXAM: Vital signs:  Vitals:   01/05/16 1718 01/05/16 2018 01/06/16 0644 01/06/16 0943  BP: 98/63 (!) 83/53 105/64 (!) 98/56  Pulse: 91 82 85 87  Resp:  (!) 22 20   Temp: 98.5 F (36.9 C) 98.6 F (37 C) 98.7 F (37.1 C)   TempSrc: Oral Oral Oral   SpO2: 95% 94% 94% 95%  Weight: 110.5 kg (243 lb 8 oz)  109.9 kg (242 lb 3.2 oz)   Height: 6' (1.829 m)        Filed Weights   01/05/16 0835 01/05/16 1718 01/06/16 0644  Weight: 114.3 kg (252 lb) 110.5 kg (243 lb 8 oz) 109.9 kg (242 lb 3.2 oz)   Body mass index is 32.85 kg/m.   General appearance :Awake, alert, not in any distress.  Eyes:, pupils equally reactive to light and accomodation,no scleral icterus. HEENT: Atraumatic and Normocephalic Neck: supple, no JVD. No cervical lymphadenopathy. No thyromegaly Resp:Decreased air entry at right base-but otherwise clear CVS: S1 S2 regular, GI: Bowel sounds present, Non tender and not distended but obese with no gaurding, rigidity or rebound.No organomegaly Extremities: B/L Lower Ext shows ++ edema in his thighs- both legs are warm to touch Neurology:  speech clear,Non focal, sensation is grossly intact. Psychiatric: Normal judgment and insight. Alert and oriented x 3. Normal mood. Musculoskeletal:No digital cyanosis Skin:chronic venous stasis changes with scaling in bilateral lower ext  I have personally reviewed following labs and imaging studies  LABORATORY DATA: CBC:  Recent Labs Lab 01/05/16 0110  WBC 6.3  HGB 12.1*  HCT 36.9*  MCV 84.8  PLT 121*    Basic Metabolic Panel:  Recent Labs Lab 01/05/16 0110  NA 136  K 3.0*  CL 97*  CO2 28  GLUCOSE 72  BUN 16  CREATININE 1.05  CALCIUM 8.4*    GFR: Estimated Creatinine Clearance: 94.6 mL/min (by C-G formula based on SCr of 1.05 mg/dL).  Liver Function Tests: No results for input(s): AST, ALT, ALKPHOS, BILITOT, PROT, ALBUMIN in the last 168 hours. No results for input(s): LIPASE, AMYLASE in the last 168 hours. No results for input(s): AMMONIA in the last 168 hours.  Coagulation Profile: No results for input(s): INR, PROTIME in the last 168 hours.  Cardiac Enzymes:  Recent Labs Lab 01/05/16 1001 01/05/16 1913 01/06/16 0004 01/06/16 0336  TROPONINI <0.03 <0.03 <0.03 <0.03    BNP (last 3 results) No results for input(s): PROBNP in the last 8760  hours.  HbA1C:  Recent Labs  01/05/16 0833  HGBA1C 7.6*    CBG:  Recent Labs Lab 01/05/16 1219 01/05/16 1655 01/05/16 2201 01/06/16 0635  GLUCAP 345* 252* 170* 135*    Lipid Profile: No results for input(s): CHOL, HDL, LDLCALC, TRIG, CHOLHDL, LDLDIRECT in the last 72 hours.  Thyroid Function Tests: No results for input(s): TSH, T4TOTAL, FREET4, T3FREE, THYROIDAB in the last 72 hours.  Anemia Panel: No results for input(s): VITAMINB12, FOLATE, FERRITIN, TIBC, IRON, RETICCTPCT in the last 72 hours.  Urine analysis:    Component Value Date/Time   COLORURINE AMBER (A) 01/05/2016 2353   APPEARANCEUR HAZY (A) 01/05/2016 2353   LABSPEC 1.028 01/05/2016 2353   PHURINE 5.0 01/05/2016 2353   GLUCOSEU NEGATIVE 01/05/2016 2353   HGBUR SMALL (A) 01/05/2016 2353   BILIRUBINUR NEGATIVE 01/05/2016 2353   KETONESUR NEGATIVE 01/05/2016 2353   PROTEINUR NEGATIVE 01/05/2016 2353   UROBILINOGEN 1.0 11/06/2014 0906   NITRITE NEGATIVE 01/05/2016 2353   LEUKOCYTESUR MODERATE (A) 01/05/2016 2353    Sepsis Labs: Lactic Acid,  Venous    Component Value Date/Time   LATICACIDVEN 1.23 01/05/2016 0413    MICROBIOLOGY: Recent Results (from the past 240 hour(s))  Gram stain     Status: None   Collection Time: 01/05/16 10:24 AM  Result Value Ref Range Status   Specimen Description FLUID PERITONEAL  Final   Special Requests NONE  Final   Gram Stain   Final    RARE WBC PRESENT, PREDOMINANTLY MONONUCLEAR NO ORGANISMS SEEN    Report Status 01/05/2016 FINAL  Final  Culture, sputum-assessment     Status: None   Collection Time: 01/05/16  9:09 PM  Result Value Ref Range Status   Specimen Description SPUTUM  Final   Special Requests NONE  Final   Sputum evaluation THIS SPECIMEN IS ACCEPTABLE FOR SPUTUM CULTURE  Final   Report Status 01/06/2016 FINAL  Final  Culture, respiratory (NON-Expectorated)     Status: None (Preliminary result)   Collection Time: 01/05/16  9:09 PM  Result Value  Ref Range Status   Specimen Description SPUTUM  Final   Special Requests NONE Reflexed from [id number]  Final   Gram Stain   Final    ABUNDANT WBC PRESENT, PREDOMINANTLY PMN ABUNDANT GRAM POSITIVE COCCI IN CLUSTERS IN PAIRS FEW GRAM NEGATIVE RODS FEW GRAM POSITIVE RODS    Culture PENDING  Incomplete   Report Status PENDING  Incomplete    RADIOLOGY STUDIES/RESULTS: Dg Chest 1 View  Result Date: 01/05/2016 CLINICAL DATA:  Post right thoracentesis. EXAM: CHEST 1 VIEW COMPARISON:  01/05/2016 FINDINGS: Moderate right pleural effusion with right lower lobe atelectasis or infiltrate. No pneumothorax following thoracentesis. Mild cardiomegaly. Left AICD is unchanged. Left lung is clear. IMPRESSION: Moderate right pleural effusion with right lower lobe atelectasis or infiltrate. No pneumothorax. Electronically Signed   By: Charlett Nose M.D.   On: 01/05/2016 10:38   Dg Chest 2 View  Result Date: 01/06/2016 CLINICAL DATA:  Three weeks of coughing and chest pain. History of CHF, community-acquired pneumonia, pleural effusions, and coronary artery disease. The patient has a permanent pacemaker defibrillator. EXAM: CHEST  2 VIEW COMPARISON:  Portable chest x-ray of January 05, 2016 FINDINGS: There remains increased density in the mid and lower right lung compatible with pleural fluid layering posteriorly. There is a tiny left pleural effusion. The left lung is well-expanded. There are coarse lung markings posteriorly in the right lower lung. The interstitial markings are increased bilaterally. The cardiac silhouette is enlarged. The pulmonary vascularity is engorged. The ICD is in stable position. There is calcification in the wall of the aortic arch. IMPRESSION: There is a moderate-sized right pleural effusion and trace left pleural effusion. There is pulmonary interstitial edema secondary to CHF. There may be right basilar atelectasis or pneumonia. There has been slight interval deterioration in the appearance of  the pulmonary interstitium bilaterally since yesterday's study. Thoracic aortic atherosclerosis. Electronically Signed   By: David  [place] M.D.   On: 01/06/2016 08:05   Dg Chest 2 View  Result Date: 01/05/2016 CLINICAL DATA:  Chest pain, cough. History of diabetes, cardiomyopathy/CHF. History of RIGHT thoracentesis. EXAM: CHEST  2 VIEW COMPARISON:  Chest radiograph January 07, 2015 and CT chest January 02, 2015 FINDINGS: Moderate to large RIGHT pleural effusion and underlying consolidation. Mild bronchitic changes. RIGHT heart border obscured. Mildly calcified aortic knob. Single lead LEFT cardiac pacemaker in situ. No pneumothorax. Old RIGHT lateral rib fractures. IMPRESSION: Moderate to large recurrent RIGHT pleural effusion with underlying consolidation, recommend follow up to exclude mass. Mild bronchitic  changes. Electronically Signed   By: Awilda Metroourtnay  Bloomer M.D.   On: 01/05/2016 02:04   Ct Chest W Contrast  Result Date: 01/05/2016 CLINICAL DATA:  Sudden onset of left-sided chest pain EXAM: CT CHEST WITH CONTRAST TECHNIQUE: Multidetector CT imaging of the chest was performed during intravenous contrast administration. CONTRAST:  75mL ISOVUE-300 IOPAMIDOL (ISOVUE-300) INJECTION 61% COMPARISON:  Chest radiograph 01/05/2016, CT chest 01/02/2015 FINDINGS: Cardiovascular: Atherosclerotic calcifications within the aorta. No aneurysm. Extensive coronary artery calcifications. Heart size upper normal. Partially visualized cardiac pacing lead. No large pericardial effusion. Mediastinum/Nodes: Stable nonspecific mediastinal lymph nodes. Trachea is midline. Thyroid gland unremarkable. Esophagus is within normal limits. Lungs/Pleura: Large right-sided pleural effusion with consolidation in the right lower lobe and partial consolidation in the right middle lobe. The left lung demonstrates scattered foci of hazy density in the lingula and left lower lobe which may reflect diffuse atelectasis or possibly mild edema. No  pneumothorax. No left effusion. Upper Abdomen: Upper abdominal structures show no acute abnormalities. Ascites adjacent to the spleen and liver. Musculoskeletal: No suspicious bone lesions. Old right-sided rib fractures IMPRESSION: 1. Large right-sided pleural effusion with consolidation in the right lower lobe. Partial consolidation in the right middle lobe, cannot rule out a pneumonia. No significant left pleural effusion. Scattered mild densities within the left upper and lower lobes could reflect scattered foci of atelectasis or mild edema. 2. Ascites in the upper abdomen 3. No change in mild mediastinal adenopathy. Electronically Signed   By: Jasmine PangKim  Fujinaga M.D.   On: 01/05/2016 03:44   [place] Thoracentesis Asp Pleural Space W/img Guide  Result Date: 01/05/2016 INDICATION: Congestive heart failure. History of right pleural effusion one year ago. Right chest pain. Large right pleural effusion. Request for diagnostic and therapeutic thoracentesis. EXAM: ULTRASOUND GUIDED RIGHT THORACENTESIS MEDICATIONS: 1% Lidocaine = 10 ml. COMPLICATIONS: None immediate. PROCEDURE: An ultrasound guided thoracentesis was thoroughly discussed with the patient and questions answered. The benefits, risks, alternatives and complications were also discussed. The patient understands and wishes to proceed with the procedure. Written consent was obtained. Ultrasound was performed to localize and mark an adequate pocket of fluid in the right chest. The area was then prepped and draped in the normal sterile fashion. 1% Lidocaine was used for local anesthesia. Under ultrasound guidance a 6 Fr Safe-T-Centesis catheter was introduced. Thoracentesis was performed. The catheter was removed and a dressing applied. FINDINGS: A total of approximately 2 liters of clear yellow fluid was removed. Samples were sent to the laboratory as requested by the clinical team. IMPRESSION: Successful ultrasound guided right thoracentesis yielding 2 liters of  pleural fluid. Post procedure ultrasound revealed remaining moderate effusion. Recommend repeat thoracentesis tomorrow. Read by:  Corrin ParkerWendy Blair, PA-C Electronically Signed   By: Judie PetitM.  Shick M.D.   On: 01/05/2016 10:23     LOS: 1 day   Jeoffrey MassedGHIMIRE,Deron Poole, MD  [facility] Pager:[phone]  If 7PM-7AM, please contact night-coverage [email or web address] Password [email or web address] 01/06/2016, 10:59 AM

## 2016-01-07 ENCOUNTER — Encounter (HOSPITAL_COMMUNITY)
Admission: EM | Disposition: A | Discharge status: Home / Self Care | Payer: Self-pay | Source: Home / Self Care | Attending: Internal Medicine

## 2016-01-07 ENCOUNTER — Other Ambulatory Visit (HOSPITAL_COMMUNITY): Payer: BLUE CROSS/BLUE SHIELD

## 2016-01-07 ENCOUNTER — Encounter (HOSPITAL_COMMUNITY): Payer: BLUE CROSS/BLUE SHIELD

## 2016-01-07 ENCOUNTER — Encounter (HOSPITAL_COMMUNITY): Payer: Self-pay | Admitting: Cardiology

## 2016-01-07 DIAGNOSIS — I255 Ischemic cardiomyopathy: Secondary | ICD-10-CM

## 2016-01-07 DIAGNOSIS — I249 Acute ischemic heart disease, unspecified: Secondary | ICD-10-CM

## 2016-01-07 DIAGNOSIS — I2510 Atherosclerotic heart disease of native coronary artery without angina pectoris: Secondary | ICD-10-CM

## 2016-01-07 DIAGNOSIS — J181 Lobar pneumonia, unspecified organism: Secondary | ICD-10-CM

## 2016-01-07 HISTORY — PX: CARDIAC CATHETERIZATION: SHX172

## 2016-01-07 LAB — PROTIME-INR
INR: 1.32
Prothrombin Time: 16.4 seconds — ABNORMAL HIGH (ref 11.4–15.2)

## 2016-01-07 LAB — GLUCOSE, CAPILLARY
GLUCOSE-CAPILLARY: 192 mg/dL — AB (ref 65–99)
Glucose-Capillary: 153 mg/dL — ABNORMAL HIGH (ref 65–99)
Glucose-Capillary: 138 mg/dL — ABNORMAL HIGH (ref 65–99)
Glucose-Capillary: 168 mg/dL — ABNORMAL HIGH (ref 65–99)
Glucose-Capillary: 158 mg/dL — ABNORMAL HIGH (ref 65–99)

## 2016-01-07 LAB — BASIC METABOLIC PANEL
ANION GAP: 8 (ref 5–15)
BUN: 22 mg/dL — AB (ref 6–20)
CHLORIDE: 97 mmol/L — AB (ref 101–111)
CO2: 32 mmol/L (ref 22–32)
Calcium: 8.5 mg/dL — ABNORMAL LOW (ref 8.9–10.3)
Creatinine, Ser: 1.02 mg/dL (ref 0.61–1.24)
GFR calc Af Amer: >60 mL/min (ref >60)
GLUCOSE: 149 mg/dL — AB (ref 65–99)
POTASSIUM: 4 mmol/L (ref 3.5–5.1)
Sodium: 137 mmol/L (ref 135–145)

## 2016-01-07 LAB — CBC
HCT: 39.4 % (ref 39.0–52.0)
HEMOGLOBIN: 12.5 g/dL — AB (ref 13.0–17.0)
MCH: 28.1 pg (ref 26.0–34.0)
MCHC: 31.7 g/dL (ref 30.0–36.0)
MCV: 88.5 fL (ref 78.0–100.0)
Platelets: 104 10*3/uL — ABNORMAL LOW (ref 150–400)
RBC: 4.45 MIL/uL (ref 4.22–5.81)
RDW: 18.9 % — ABNORMAL HIGH (ref 11.5–15.5)
WBC: 4.9 10*3/uL (ref 4.0–10.5)

## 2016-01-07 LAB — HEPARIN ANTI-XA: Heparin LMW: <0.1 IU/mL

## 2016-01-07 LAB — MAGNESIUM: MAGNESIUM: 1.9 mg/dL (ref 1.7–2.4)

## 2016-01-07 LAB — LEGIONELLA PNEUMOPHILA SEROGP 1 UR AG: L. pneumophila Serogp 1 Ur Ag: NEGATIVE

## 2016-01-07 SURGERY — RIGHT/LEFT HEART CATH AND CORONARY ANGIOGRAPHY
Anesthesia: LOCAL

## 2016-01-07 MED ORDER — VERAPAMIL HCL 2.5 MG/ML IV SOLN
INTRAVENOUS | Status: DC | PRN
  Administered 2016-01-07: 10 mL via INTRA_ARTERIAL

## 2016-01-07 MED ORDER — MIDAZOLAM HCL 2 MG/2ML IJ SOLN
INTRAMUSCULAR | Status: AC
  Filled 2016-01-07: qty 2

## 2016-01-07 MED ORDER — ONDANSETRON HCL 4 MG/2ML IJ SOLN: 4.0000 mg | Freq: Four times a day (QID) | INTRAMUSCULAR | Status: DC | PRN

## 2016-01-07 MED ORDER — HEPARIN SODIUM (PORCINE) 1000 UNIT/ML IJ SOLN
INTRAMUSCULAR | Status: AC
  Filled 2016-01-07: qty 1

## 2016-01-07 MED ORDER — SODIUM CHLORIDE 0.9% FLUSH: 3.0000 mL | INTRAVENOUS | Status: DC | PRN

## 2016-01-07 MED ORDER — SODIUM CHLORIDE 0.9% FLUSH
3.0000 mL | Freq: Two times a day (BID) | INTRAVENOUS | Status: DC
  Administered 2016-01-07 (×2): 3 mL via INTRAVENOUS

## 2016-01-07 MED ORDER — SODIUM CHLORIDE 0.9% FLUSH: 10.0000 mL | INTRAVENOUS | Status: DC | PRN

## 2016-01-07 MED ORDER — LIDOCAINE HCL (PF) 1 % IJ SOLN
INTRAMUSCULAR | Status: AC
  Filled 2016-01-07: qty 30

## 2016-01-07 MED ORDER — MIDAZOLAM HCL 2 MG/2ML IJ SOLN
INTRAMUSCULAR | Status: DC | PRN
  Administered 2016-01-07: 0.5 mg via INTRAVENOUS

## 2016-01-07 MED ORDER — SODIUM CHLORIDE 0.9% FLUSH
3.0000 mL | Freq: Two times a day (BID) | INTRAVENOUS | Status: DC
  Administered 2016-01-07 (×3): 3 mL via INTRAVENOUS

## 2016-01-07 MED ORDER — SODIUM CHLORIDE 0.9% FLUSH
10.0000 mL | Freq: Two times a day (BID) | INTRAVENOUS | Status: DC
  Administered 2016-01-07 (×2): 10 mL

## 2016-01-07 MED ORDER — HEPARIN (PORCINE) IN NACL 2-0.9 UNIT/ML-% IJ SOLN
INTRAMUSCULAR | Status: DC | PRN
  Administered 2016-01-07: 1000 mL

## 2016-01-07 MED ORDER — HEPARIN SODIUM (PORCINE) 5000 UNIT/ML IJ SOLN
5000.0000 [IU] | Freq: Three times a day (TID) | INTRAMUSCULAR | Status: DC
  Administered 2016-01-07 – 2016-01-11 (×11): 5000 [IU] via SUBCUTANEOUS
  Filled 2016-01-07 (×11): qty 1

## 2016-01-07 MED ORDER — VERAPAMIL HCL 2.5 MG/ML IV SOLN
INTRAVENOUS | Status: AC
  Filled 2016-01-07: qty 2

## 2016-01-07 MED ORDER — IOPAMIDOL (ISOVUE-370) INJECTION 76%
INTRAVENOUS | Status: AC
  Filled 2016-01-07: qty 100

## 2016-01-07 MED ORDER — HEPARIN (PORCINE) IN NACL 2-0.9 UNIT/ML-% IJ SOLN
INTRAMUSCULAR | Status: AC
  Filled 2016-01-07: qty 1000

## 2016-01-07 MED ORDER — LIDOCAINE HCL (PF) 1 % IJ SOLN
INTRAMUSCULAR | Status: DC | PRN
  Administered 2016-01-07 (×2): 2 mL

## 2016-01-07 MED ORDER — SODIUM CHLORIDE 0.9 % IV SOLN: 250.0000 mL | INTRAVENOUS | Status: DC | PRN

## 2016-01-07 MED ORDER — IOPAMIDOL (ISOVUE-370) INJECTION 76%
INTRAVENOUS | Status: DC | PRN
Start: 2016-01-07 — End: 2016-01-07
  Administered 2016-01-07: 50 mL via INTRA_ARTERIAL

## 2016-01-07 MED ORDER — DIGOXIN 125 MCG PO TABS
0.1250 mg | ORAL_TABLET | Freq: Every day | ORAL | Status: DC
  Administered 2016-01-07 – 2016-01-11 (×5): 0.125 mg via ORAL
  Filled 2016-01-07 (×5): qty 1

## 2016-01-07 MED ORDER — ACETAMINOPHEN 325 MG PO TABS: 650.0000 mg | ORAL_TABLET | ORAL | Status: DC | PRN

## 2016-01-07 MED ORDER — FENTANYL CITRATE (PF) 100 MCG/2ML IJ SOLN
INTRAMUSCULAR | Status: AC
  Filled 2016-01-07: qty 2

## 2016-01-07 MED ORDER — HEPARIN SODIUM (PORCINE) 1000 UNIT/ML IJ SOLN
INTRAMUSCULAR | Status: DC | PRN
  Administered 2016-01-07: 5000 [IU] via INTRAVENOUS

## 2016-01-07 MED ORDER — FENTANYL CITRATE (PF) 100 MCG/2ML IJ SOLN
INTRAMUSCULAR | Status: DC | PRN
  Administered 2016-01-07: 25 ug via INTRAVENOUS

## 2016-01-07 MED ORDER — METOLAZONE 2.5 MG PO TABS
2.5000 mg | ORAL_TABLET | Freq: Once | ORAL | Status: AC
  Administered 2016-01-07: 2.5 mg via ORAL
  Filled 2016-01-07: qty 1

## 2016-01-07 SURGICAL SUPPLY — 13 items
CATH BALLN WEDGE 5F 110CM (CATHETERS) ×1 IMPLANT
CATH EXPO 5F FL3.5 (CATHETERS) ×1 IMPLANT
CATH EXPO 5FR FR4 (CATHETERS) ×1 IMPLANT
DEVICE RAD COMP TR BAND LRG (VASCULAR PRODUCTS) ×1 IMPLANT
ELECT DEFIB PAD ADLT CADENCE (PAD) ×1 IMPLANT
GLIDESHEATH SLEND SS 6F .021 (SHEATH) ×1 IMPLANT
GUIDEWIRE INQWIRE 1.5J.035X260 (WIRE) IMPLANT
INQWIRE 1.5J .035X260CM (WIRE) ×2
KIT HEART LEFT (KITS) ×2 IMPLANT
PACK CARDIAC CATHETERIZATION (CUSTOM PROCEDURE TRAY) ×2 IMPLANT
SHEATH FAST CATH BRACH 5F 5CM (SHEATH) ×1 IMPLANT
TRANSDUCER W/STOPCOCK (MISCELLANEOUS) ×3 IMPLANT
TUBING CIL FLEX 10 FLL-RA (TUBING) ×2 IMPLANT

## 2016-01-07 NOTE — Progress Notes (Signed)
Peripherally Inserted Central Catheter/Midline Placement  The IV Nurse has discussed with the patient and/or persons authorized to consent for the patient, the purpose of this procedure and the potential benefits and risks involved with this procedure.  The benefits include less needle sticks, lab draws from the catheter, and the patient may be discharged home with the catheter. Risks include, but not limited to, infection, bleeding, blood clot (thrombus formation), and puncture of an artery; nerve damage and irregular heartbeat and possibility to perform a PICC exchange if needed/ordered by physician.  Alternatives to this procedure were also discussed.  Bard Power PICC patient education guide, fact sheet on infection prevention and patient information card has been provided to patient /or left at bedside.    PICC/Midline Placement Documentation  PICC Double Lumen 01/07/16 PICC Right Basilic 44 cm 2 cm (Active)  Indication for Insertion or Continuance of Line Vasoactive infusions 01/07/2016  3:00 PM  Exposed Catheter (cm) 2 cm 01/07/2016  3:00 PM  Dressing Change Due 01/14/16 01/07/2016  3:00 PM       Reginia FortsLumban, Raigan Baria [place] 01/07/2016, 3:05 PM

## 2016-01-07 NOTE — Progress Notes (Signed)
Patient ID: [name], male   DOB: 30-Jul-1955, 61 y.o.   MRN: [id number]    SUBJECTIVE: Lasix increased to 120 mg IV every 8 hrs yesterday and dobutamine 2.5 started with soft blood pressure (down to SBP 80s).  BP better today and creatinine lower.  Still not vigorous urine output.  Weight stable.   Echo (1/18): EF 20%, global hypokinesis, decreased RV systolic function.   Status post thoracentesis: Exudate by Light's criteria (total protein).  Fluid cultures negative so far.  Scheduled Meds: . aspirin EC  81 mg Oral Daily  . atorvastatin  80 mg Oral QODAY  . digoxin  0.125 mg Oral Daily  . enoxaparin (LOVENOX) injection  40 mg Subcutaneous Q24H  . ezetimibe  10 mg Oral Daily  . furosemide  120 mg Intravenous Q8H  . insulin aspart  0-9 Units Subcutaneous TID WC  . insulin glargine  12 Units Subcutaneous QHS  . levofloxacin (LEVAQUIN) IV  750 mg Intravenous Q24H  . metolazone  2.5 mg Oral Once  . potassium chloride  40 mEq Oral Daily  . sodium chloride flush  3 mL Intravenous Q12H  . sodium chloride flush  3 mL Intravenous Q12H  . spironolactone  12.5 mg Oral Daily   Continuous Infusions: . sodium chloride 10 mL/hr at 01/07/16 0700  . DOBUTamine 2.5 mcg/kg/min (01/07/16 0700)   PRN Meds:.sodium chloride, sodium chloride, acetaminophen, guaiFENesin-dextromethorphan, ipratropium-albuterol, ondansetron (ZOFRAN) IV, sodium chloride flush    Vitals:   01/07/16 0400 01/07/16 0500 01/07/16 0600 01/07/16 0700  BP: 121/63 116/67 121/60 126/72  Pulse: 86     Resp: (!) 26 (!) 24 (!) 28 (!) 32  Temp: 98.2 F (36.8 C)     TempSrc: Oral     SpO2: 96% 95% 92% 93%  Weight:  242 lb 15.2 oz (110.2 kg)    Height:        Intake/Output Summary (Last 24 hours) at 01/07/16 0746 Last data filed at 01/07/16 0700  Gross per 24 hour  Intake           319.57 ml  Output             1770 ml  Net         -1450.43 ml    LABS: Basic Metabolic Panel:  Recent Labs  47/82/95 1042  01/07/16 0344  NA 134* 137  K 3.4* 4.0  CL 96* 97*  CO2 26 32  GLUCOSE 139* 149*  BUN 25* 22*  CREATININE 1.32* 1.02  CALCIUM 8.5* 8.5*  MG  --  1.9   Liver Function Tests:  Recent Labs  01/06/16 1042  AST 27  ALT 15*  ALKPHOS 108  BILITOT 1.4*  PROT 7.1  ALBUMIN 2.9*   No results for input(s): LIPASE, AMYLASE in the last 72 hours. CBC:  Recent Labs  01/05/16 0110 01/07/16 0344  WBC 6.3 4.9  HGB 12.1* 12.5*  HCT 36.9* 39.4  MCV 84.8 88.5  PLT 121* 104*   Cardiac Enzymes:  Recent Labs  01/06/16 0004 01/06/16 0336 01/06/16 1042  TROPONINI <0.03 <0.03 <0.03   BNP: Invalid input(s): POCBNP D-Dimer: No results for input(s): DDIMER in the last 72 hours. Hemoglobin A1C:  Recent Labs  01/05/16 0833  HGBA1C 7.6*   Fasting Lipid Panel: No results for input(s): CHOL, HDL, LDLCALC, TRIG, CHOLHDL, LDLDIRECT in the last 72 hours. Thyroid Function Tests: No results for input(s): TSH, T4TOTAL, T3FREE, THYROIDAB in the last 72 hours.  Invalid input(s): FREET3 Anemia  Panel: No results for input(s): VITAMINB12, FOLATE, FERRITIN, TIBC, IRON, RETICCTPCT in the last 72 hours.  RADIOLOGY: Dg Chest 1 View  Result Date: 01/05/2016 CLINICAL DATA:  Post right thoracentesis. EXAM: CHEST 1 VIEW COMPARISON:  01/05/2016 FINDINGS: Moderate right pleural effusion with right lower lobe atelectasis or infiltrate. No pneumothorax following thoracentesis. Mild cardiomegaly. Left AICD is unchanged. Left lung is clear. IMPRESSION: Moderate right pleural effusion with right lower lobe atelectasis or infiltrate. No pneumothorax. Electronically Signed   By: Charlett NoseKevin  Dover M.D.   On: 01/05/2016 10:38   Dg Chest 2 View  Result Date: 01/06/2016 CLINICAL DATA:  Three weeks of coughing and chest pain. History of CHF, community-acquired pneumonia, pleural effusions, and coronary artery disease. The patient has a permanent pacemaker defibrillator. EXAM: CHEST  2 VIEW COMPARISON:  Portable chest  x-ray of January 05, 2016 FINDINGS: There remains increased density in the mid and lower right lung compatible with pleural fluid layering posteriorly. There is a tiny left pleural effusion. The left lung is well-expanded. There are coarse lung markings posteriorly in the right lower lung. The interstitial markings are increased bilaterally. The cardiac silhouette is enlarged. The pulmonary vascularity is engorged. The ICD is in stable position. There is calcification in the wall of the aortic arch. IMPRESSION: There is a moderate-sized right pleural effusion and trace left pleural effusion. There is pulmonary interstitial edema secondary to CHF. There may be right basilar atelectasis or pneumonia. There has been slight interval deterioration in the appearance of the pulmonary interstitium bilaterally since yesterday's study. Thoracic aortic atherosclerosis. Electronically Signed   By: David  SwazilandJordan M.D.   On: 01/06/2016 08:05   Dg Chest 2 View  Result Date: 01/05/2016 CLINICAL DATA:  Chest pain, cough. History of diabetes, cardiomyopathy/CHF. History of RIGHT thoracentesis. EXAM: CHEST  2 VIEW COMPARISON:  Chest radiograph January 07, 2015 and CT chest January 02, 2015 FINDINGS: Moderate to large RIGHT pleural effusion and underlying consolidation. Mild bronchitic changes. RIGHT heart border obscured. Mildly calcified aortic knob. Single lead LEFT cardiac pacemaker in situ. No pneumothorax. Old RIGHT lateral rib fractures. IMPRESSION: Moderate to large recurrent RIGHT pleural effusion with underlying consolidation, recommend follow up to exclude mass. Mild bronchitic changes. Electronically Signed   By: Awilda Metroourtnay  Bloomer M.D.   On: 01/05/2016 02:04   Ct Chest W Contrast  Result Date: 01/05/2016 CLINICAL DATA:  Sudden onset of left-sided chest pain EXAM: CT CHEST WITH CONTRAST TECHNIQUE: Multidetector CT imaging of the chest was performed during intravenous contrast administration. CONTRAST:  75mL ISOVUE-300  IOPAMIDOL (ISOVUE-300) INJECTION 61% COMPARISON:  Chest radiograph 01/05/2016, CT chest 01/02/2015 FINDINGS: Cardiovascular: Atherosclerotic calcifications within the aorta. No aneurysm. Extensive coronary artery calcifications. Heart size upper normal. Partially visualized cardiac pacing lead. No large pericardial effusion. Mediastinum/Nodes: Stable nonspecific mediastinal lymph nodes. Trachea is midline. Thyroid gland unremarkable. Esophagus is within normal limits. Lungs/Pleura: Large right-sided pleural effusion with consolidation in the right lower lobe and partial consolidation in the right middle lobe. The left lung demonstrates scattered foci of hazy density in the lingula and left lower lobe which may reflect diffuse atelectasis or possibly mild edema. No pneumothorax. No left effusion. Upper Abdomen: Upper abdominal structures show no acute abnormalities. Ascites adjacent to the spleen and liver. Musculoskeletal: No suspicious bone lesions. Old right-sided rib fractures IMPRESSION: 1. Large right-sided pleural effusion with consolidation in the right lower lobe. Partial consolidation in the right middle lobe, cannot rule out a pneumonia. No significant left pleural effusion. Scattered mild densities within the  left upper and lower lobes could reflect scattered foci of atelectasis or mild edema. 2. Ascites in the upper abdomen 3. No change in mild mediastinal adenopathy. Electronically Signed   By: Jasmine Pang M.D.   On: 01/05/2016 03:44   US Thoracentesis Asp Pleural Space W/img Guide  Result Date: 01/05/2016 INDICATION: Congestive heart failure. History of right pleural effusion one year ago. Right chest pain. Large right pleural effusion. Request for diagnostic and therapeutic thoracentesis. EXAM: ULTRASOUND GUIDED RIGHT THORACENTESIS MEDICATIONS: 1% Lidocaine = 10 ml. COMPLICATIONS: None immediate. PROCEDURE: An ultrasound guided thoracentesis was thoroughly discussed with the patient and questions  answered. The benefits, risks, alternatives and complications were also discussed. The patient understands and wishes to proceed with the procedure. Written consent was obtained. Ultrasound was performed to localize and mark an adequate pocket of fluid in the right chest. The area was then prepped and draped in the normal sterile fashion. 1% Lidocaine was used for local anesthesia. Under ultrasound guidance a 6 Fr Safe-T-Centesis catheter was introduced. Thoracentesis was performed. The catheter was removed and a dressing applied. FINDINGS: A total of approximately 2 liters of clear yellow fluid was removed. Samples were sent to the laboratory as requested by the clinical team. IMPRESSION: Successful ultrasound guided right thoracentesis yielding 2 liters of pleural fluid. Post procedure ultrasound revealed remaining moderate effusion. Recommend repeat thoracentesis tomorrow. Read by:  Corrin Parker, PA-C Electronically Signed   By: Judie Petit.  Shick M.D.   On: 01/05/2016 10:23    PHYSICAL EXAM General: Mild dyspnea with talking.  Neck: JVP 16+ cm, no thyromegaly or thyroid nodule.  Lungs: Rhonchi bilaterally CV: Nondisplaced PMI.  Heart regular S1/S2, no S3/S4, 1/6 SEM RUSB.  No peripheral edema.  No carotid bruit.  Normal pedal pulses.  Abdomen: Soft, nontender, no hepatosplenomegaly, mild distention.  Neurologic: Alert and oriented x 3.  Psych: Normal affect. Extremities: No clubbing or cyanosis. 1+ edema to thighs with chronic venous stasis changes lower legs.   TELEMETRY: Reviewed telemetry pt in NSR  ASSESSMENT AND PLAN: 61 yo with history of PAD (s/p fem-pop), HTN, DM, CAD, ischemic cardiomyopathy/chronic systolic CHF presented with gradual worsening of volume overload and dyspnea.  1. Acute on chronic systolic CHF: Echo 1/18 with EF 20% and decreased RV systolic function.  Ischemic cardiomyopathy, has [employer] ICD.  SBP in 80s at times on 1/4, he was started on dobutamine gtt and moved to ICD.   Planned for PICC but not yet placed.  BP better today, creatinine lower.  Still not particularly vigorous UOP.  He is very volume overloaded.  - Would continue dobutamine today at 2.5.  - RHC/LHC for re-evaluation of coronaries as well as filling pressures and cardiac output on dobutamine. Will plan for today.  Risks/benefits discussed with patient and he agrees to proceed.  - PICC placement after cath to follow co-ox and CVP.  - Off Entresto with low BP and Coreg with concern for low output/dobutamine use.  - Continue spironolactone and add digoxin.  - Continue Lasix 120 mg IV every 8 hrs (increased yesterday pm) and add a dose of metolazone 2.5 before next Lasix bolus.  - Concern that he may be nearing need for more advanced therapies.  LVAD would likely be an option.  2. PNA: Right-sided PNA noted on chest CT. Pending sputum culture.  He is on levofloxacin per primary service.  Afebrile.  3. Right pleural effusion: s/p thoracentesis yesterday.  Exudative by Light's criteria, parapneumonic.  4. CAD: No chest pain.  Last Cardiolite 2014 with infarct, no ischemia.  Has history of stents to LAD, LCx, RCA but no cath since 2006.  - See discussion above regarding LHC/RHC today.  - Continue ASA, statin, Zetia.   Marca Ancona 01/07/2016 7:58 AM

## 2016-01-07 NOTE — Progress Notes (Signed)
35fr sheath aspirated and removed from right a/c manual pressure applied for 15 minutes , site level 0. Tr band in place on right wrist. 3cc removed at 10:10 am site unchanged. Bedrest instructions given.

## 2016-01-07 NOTE — Progress Notes (Signed)
OT Cancellation Note  Patient Details Name: Dalton RainwaterLarry D Furey MRN: [id number] DOB: 1955/01/26   Cancelled Treatment:    Reason Eval/Treat Not Completed: Patient at procedure or test/ unavailable.  Pt undergoing PICC line placement.  Will try back.  Leela Vanbrocklin Tayloronarpe, OTR/L [phone]   Jeani HawkingConarpe, [name] 01/07/2016, 2:05 PM

## 2016-01-07 NOTE — H&P (View-Only) (Signed)
Patient ID: [name], male   DOB: 30-Jul-1955, 61 y.o.   MRN: [id number]    SUBJECTIVE: Lasix increased to 120 mg IV every 8 hrs yesterday and dobutamine 2.5 started with soft blood pressure (down to SBP 80s).  BP better today and creatinine lower.  Still not vigorous urine output.  Weight stable.   Echo (1/18): EF 20%, global hypokinesis, decreased RV systolic function.   Status post thoracentesis: Exudate by Light's criteria (total protein).  Fluid cultures negative so far.  Scheduled Meds: . aspirin EC  81 mg Oral Daily  . atorvastatin  80 mg Oral QODAY  . digoxin  0.125 mg Oral Daily  . enoxaparin (LOVENOX) injection  40 mg Subcutaneous Q24H  . ezetimibe  10 mg Oral Daily  . furosemide  120 mg Intravenous Q8H  . insulin aspart  0-9 Units Subcutaneous TID WC  . insulin glargine  12 Units Subcutaneous QHS  . levofloxacin (LEVAQUIN) IV  750 mg Intravenous Q24H  . metolazone  2.5 mg Oral Once  . potassium chloride  40 mEq Oral Daily  . sodium chloride flush  3 mL Intravenous Q12H  . sodium chloride flush  3 mL Intravenous Q12H  . spironolactone  12.5 mg Oral Daily   Continuous Infusions: . sodium chloride 10 mL/hr at 01/07/16 0700  . DOBUTamine 2.5 mcg/kg/min (01/07/16 0700)   PRN Meds:.sodium chloride, sodium chloride, acetaminophen, guaiFENesin-dextromethorphan, ipratropium-albuterol, ondansetron (ZOFRAN) IV, sodium chloride flush    Vitals:   01/07/16 0400 01/07/16 0500 01/07/16 0600 01/07/16 0700  BP: 121/63 116/67 121/60 126/72  Pulse: 86     Resp: (!) 26 (!) 24 (!) 28 (!) 32  Temp: 98.2 F (36.8 C)     TempSrc: Oral     SpO2: 96% 95% 92% 93%  Weight:  242 lb 15.2 oz (110.2 kg)    Height:        Intake/Output Summary (Last 24 hours) at 01/07/16 0746 Last data filed at 01/07/16 0700  Gross per 24 hour  Intake           319.57 ml  Output             1770 ml  Net         -1450.43 ml    LABS: Basic Metabolic Panel:  Recent Labs  47/82/95 1042  01/07/16 0344  NA 134* 137  K 3.4* 4.0  CL 96* 97*  CO2 26 32  GLUCOSE 139* 149*  BUN 25* 22*  CREATININE 1.32* 1.02  CALCIUM 8.5* 8.5*  MG  --  1.9   Liver Function Tests:  Recent Labs  01/06/16 1042  AST 27  ALT 15*  ALKPHOS 108  BILITOT 1.4*  PROT 7.1  ALBUMIN 2.9*   No results for input(s): LIPASE, AMYLASE in the last 72 hours. CBC:  Recent Labs  01/05/16 0110 01/07/16 0344  WBC 6.3 4.9  HGB 12.1* 12.5*  HCT 36.9* 39.4  MCV 84.8 88.5  PLT 121* 104*   Cardiac Enzymes:  Recent Labs  01/06/16 0004 01/06/16 0336 01/06/16 1042  TROPONINI <0.03 <0.03 <0.03   BNP: Invalid input(s): POCBNP D-Dimer: No results for input(s): DDIMER in the last 72 hours. Hemoglobin A1C:  Recent Labs  01/05/16 0833  HGBA1C 7.6*   Fasting Lipid Panel: No results for input(s): CHOL, HDL, LDLCALC, TRIG, CHOLHDL, LDLDIRECT in the last 72 hours. Thyroid Function Tests: No results for input(s): TSH, T4TOTAL, T3FREE, THYROIDAB in the last 72 hours.  Invalid input(s): FREET3 Anemia  Panel: No results for input(s): VITAMINB12, FOLATE, FERRITIN, TIBC, IRON, RETICCTPCT in the last 72 hours.  RADIOLOGY: Dg Chest 1 View  Result Date: 01/05/2016 CLINICAL DATA:  Post right thoracentesis. EXAM: CHEST 1 VIEW COMPARISON:  01/05/2016 FINDINGS: Moderate right pleural effusion with right lower lobe atelectasis or infiltrate. No pneumothorax following thoracentesis. Mild cardiomegaly. Left AICD is unchanged. Left lung is clear. IMPRESSION: Moderate right pleural effusion with right lower lobe atelectasis or infiltrate. No pneumothorax. Electronically Signed   By: Charlett NoseKevin  Dover M.D.   On: 01/05/2016 10:38   Dg Chest 2 View  Result Date: 01/06/2016 CLINICAL DATA:  Three weeks of coughing and chest pain. History of CHF, community-acquired pneumonia, pleural effusions, and coronary artery disease. The patient has a permanent pacemaker defibrillator. EXAM: CHEST  2 VIEW COMPARISON:  Portable chest  x-ray of January 05, 2016 FINDINGS: There remains increased density in the mid and lower right lung compatible with pleural fluid layering posteriorly. There is a tiny left pleural effusion. The left lung is well-expanded. There are coarse lung markings posteriorly in the right lower lung. The interstitial markings are increased bilaterally. The cardiac silhouette is enlarged. The pulmonary vascularity is engorged. The ICD is in stable position. There is calcification in the wall of the aortic arch. IMPRESSION: There is a moderate-sized right pleural effusion and trace left pleural effusion. There is pulmonary interstitial edema secondary to CHF. There may be right basilar atelectasis or pneumonia. There has been slight interval deterioration in the appearance of the pulmonary interstitium bilaterally since yesterday's study. Thoracic aortic atherosclerosis. Electronically Signed   By: David  [place]il[place]Jordan M.D.   On: 01/06/2016 08:05   Dg Chest 2 View  Result Date: 01/05/2016 CLINICAL DATA:  Chest pain, cough. History of diabetes, cardiomyopathy/CHF. History of RIGHT thoracentesis. EXAM: CHEST  2 VIEW COMPARISON:  Chest radiograph January 07, 2015 and CT chest January 02, 2015 FINDINGS: Moderate to large RIGHT pleural effusion and underlying consolidation. Mild bronchitic changes. RIGHT heart border obscured. Mildly calcified aortic knob. Single lead LEFT cardiac pacemaker in situ. No pneumothorax. Old RIGHT lateral rib fractures. IMPRESSION: Moderate to large recurrent RIGHT pleural effusion with underlying consolidation, recommend follow up to exclude mass. Mild bronchitic changes. Electronically Signed   By: Awilda Metroourtnay  Bloomer M.D.   On: 01/05/2016 02:04   Ct Chest W Contrast  Result Date: 01/05/2016 CLINICAL DATA:  Sudden onset of left-sided chest pain EXAM: CT CHEST WITH CONTRAST TECHNIQUE: Multidetector CT imaging of the chest was performed during intravenous contrast administration. CONTRAST:  75mL ISOVUE-300  IOPAMIDOL (ISOVUE-300) INJECTION 61% COMPARISON:  Chest radiograph 01/05/2016, CT chest 01/02/2015 FINDINGS: Cardiovascular: Atherosclerotic calcifications within the aorta. No aneurysm. Extensive coronary artery calcifications. Heart size upper normal. Partially visualized cardiac pacing lead. No large pericardial effusion. Mediastinum/Nodes: Stable nonspecific mediastinal lymph nodes. Trachea is midline. Thyroid gland unremarkable. Esophagus is within normal limits. Lungs/Pleura: Large right-sided pleural effusion with consolidation in the right lower lobe and partial consolidation in the right middle lobe. The left lung demonstrates scattered foci of hazy density in the lingula and left lower lobe which may reflect diffuse atelectasis or possibly mild edema. No pneumothorax. No left effusion. Upper Abdomen: Upper abdominal structures show no acute abnormalities. Ascites adjacent to the spleen and liver. Musculoskeletal: No suspicious bone lesions. Old right-sided rib fractures IMPRESSION: 1. Large right-sided pleural effusion with consolidation in the right lower lobe. Partial consolidation in the right middle lobe, cannot rule out a pneumonia. No significant left pleural effusion. Scattered mild densities within the  left upper and lower lobes could reflect scattered foci of atelectasis or mild edema. 2. Ascites in the upper abdomen 3. No change in mild mediastinal adenopathy. Electronically Signed   By: Jasmine Pang M.D.   On: 01/05/2016 03:44   US Thoracentesis Asp Pleural Space W/img Guide  Result Date: 01/05/2016 INDICATION: Congestive heart failure. History of right pleural effusion one year ago. Right chest pain. Large right pleural effusion. Request for diagnostic and therapeutic thoracentesis. EXAM: ULTRASOUND GUIDED RIGHT THORACENTESIS MEDICATIONS: 1% Lidocaine = 10 ml. COMPLICATIONS: None immediate. PROCEDURE: An ultrasound guided thoracentesis was thoroughly discussed with the patient and questions  answered. The benefits, risks, alternatives and complications were also discussed. The patient understands and wishes to proceed with the procedure. Written consent was obtained. Ultrasound was performed to localize and mark an adequate pocket of fluid in the right chest. The area was then prepped and draped in the normal sterile fashion. 1% Lidocaine was used for local anesthesia. Under ultrasound guidance a 6 Fr Safe-T-Centesis catheter was introduced. Thoracentesis was performed. The catheter was removed and a dressing applied. FINDINGS: A total of approximately 2 liters of clear yellow fluid was removed. Samples were sent to the laboratory as requested by the clinical team. IMPRESSION: Successful ultrasound guided right thoracentesis yielding 2 liters of pleural fluid. Post procedure ultrasound revealed remaining moderate effusion. Recommend repeat thoracentesis tomorrow. Read by:  Corrin Parker, PA-C Electronically Signed   By: Judie Petit.  Shick M.D.   On: 01/05/2016 10:23    PHYSICAL EXAM General: Mild dyspnea with talking.  Neck: JVP 16+ cm, no thyromegaly or thyroid nodule.  Lungs: Rhonchi bilaterally CV: Nondisplaced PMI.  Heart regular S1/S2, no S3/S4, 1/6 SEM RUSB.  No peripheral edema.  No carotid bruit.  Normal pedal pulses.  Abdomen: Soft, nontender, no hepatosplenomegaly, mild distention.  Neurologic: Alert and oriented x 3.  Psych: Normal affect. Extremities: No clubbing or cyanosis. 1+ edema to thighs with chronic venous stasis changes lower legs.   TELEMETRY: Reviewed telemetry pt in NSR  ASSESSMENT AND PLAN: 61 yo with history of PAD (s/p fem-pop), HTN, DM, CAD, ischemic cardiomyopathy/chronic systolic CHF presented with gradual worsening of volume overload and dyspnea.  1. Acute on chronic systolic CHF: Echo 1/18 with EF 20% and decreased RV systolic function.  Ischemic cardiomyopathy, has [employer] ICD.  SBP in 80s at times on 1/4, he was started on dobutamine gtt and moved to ICD.   Planned for PICC but not yet placed.  BP better today, creatinine lower.  Still not particularly vigorous UOP.  He is very volume overloaded.  - Would continue dobutamine today at 2.5.  - RHC/LHC for re-evaluation of coronaries as well as filling pressures and cardiac output on dobutamine. Will plan for today.  Risks/benefits discussed with patient and he agrees to proceed.  - PICC placement after cath to follow co-ox and CVP.  - Off Entresto with low BP and Coreg with concern for low output/dobutamine use.  - Continue spironolactone and add digoxin.  - Continue Lasix 120 mg IV every 8 hrs (increased yesterday pm) and add a dose of metolazone 2.5 before next Lasix bolus.  - Concern that he may be nearing need for more advanced therapies.  LVAD would likely be an option.  2. PNA: Right-sided PNA noted on chest CT. Pending sputum culture.  He is on levofloxacin per primary service.  Afebrile.  3. Right pleural effusion: s/p thoracentesis yesterday.  Exudative by Light's criteria, parapneumonic.  4. CAD: No chest pain.  Last Cardiolite 2014 with infarct, no ischemia.  Has history of stents to LAD, LCx, RCA but no cath since 2006.  - See discussion above regarding LHC/RHC today.  - Continue ASA, statin, Zetia.   Marca Ancona 01/07/2016 7:58 AM

## 2016-01-07 NOTE — Progress Notes (Signed)
PT Cancellation Note  Patient Details Name: Dalton RainwaterLarry D [name] MRN: [id number] DOB: [birth year]   Cancelled Treatment:    Reason Eval/Treat Not Completed: Medical issues which prohibited therapy. Pt has just returned from cath lab. Will re-attempt this PM.   Angelina OkCary W [facility] 01/07/2016, 11:25 AM Skip Mayerary Vaun Hyndman PT [phone]

## 2016-01-07 NOTE — Progress Notes (Signed)
Patient ID: [name], male   DOB: [birth year], 61 y.o.   MRN: [id number]   Reviewed films and echo.  RV does not look as bad on the echo as the RHC numbers would suggest.  Extensive calcified coronary disease, multivessel PCI would be difficult.  At this point, I think that our best option is going to be to get him diuresed and "tuned up" as much as possible followed by high risk CABG.  I will ask Dr. Donata ClayVan Trigt to evaluate for CABG.   [name] 01/07/2016 12:29 PM

## 2016-01-07 NOTE — Interval H&P Note (Signed)
History and Physical Interval Note:  01/07/2016 8:47 AM  [name]  has presented today for surgery, with the diagnosis of heart failure  The various methods of treatment have been discussed with the patient and family. After consideration of risks, benefits and other options for treatment, the patient has consented to  Procedure(s): Right/Left Heart Cath and Coronary Angiography (N/A) as a surgical intervention .  The patient's history has been reviewed, patient examined, no change in status, stable for surgery.  I have reviewed the patient's chart and labs.  Questions were answered to the patient's satisfaction.     [name] [employer]

## 2016-01-07 NOTE — Progress Notes (Signed)
PT Cancellation Note  Patient Details Name: [name] MRN: [id number] DOB: 29-Jan-1955   Cancelled Treatment:    Reason Eval/Treat Not Completed: Patient at procedure or test/unavailable. Pt now getting PICC placement.   [name] 01/07/2016, 2:13 PM [employer] Corporation[employer]ary [name] PT [phone]

## 2016-01-07 NOTE — Progress Notes (Signed)
MD wants PICC to be placed after cath lab.

## 2016-01-07 NOTE — Progress Notes (Signed)
Day of Surgery Procedure(s) (LRB): Right/Left Heart Cath and Coronary Angiography (N/A) Subjective: Patient examined, echocardiogram and left and right heart cath data and images personally reviewed. 61 year old chronically ill male smoker with marginal functional status and minimally ambulatory presents with with ischemic cardiomyopathy, ejection fraction 20% and class IV heart failure. Left heart cath shows three-vessel CAD. Echocardiogram shows severe LV dysfunction, mild RV dysfunction. Right heart cath shows pulmonary hypertension with high CVP-wedge ratio [CVP 21, wedge 27] and cardiac index greater than 2.2 and co- ox 58 percent.  The patient has venous stasis of both lower extremities to the knee. He is status post bilateral femoropopliteal bypass surgery. Saphenous vein was used for the right leg. Dacron graft was used for the left leg because his saphenous vein was of poor quality. It does not appear the patient has any usable saphenous vein conduit-a saphenous vein mapping ultrasound study is pending.  The patient has an AICD which would preclude cardiac MRI viability study.  Objective: Vital signs in last 24 hours: Temp:  [98.2 F (36.8 C)-99.3 F (37.4 C)] 98.5 F (36.9 C) (01/05 1157) Pulse Rate:  [81-88] 83 (01/05 1000) Cardiac Rhythm: Normal sinus rhythm;Bundle branch block (01/05 0700) Resp:  [4-35] 19 (01/05 1330) BP: (106-138)/(52-81) 138/68 (01/05 1330) SpO2:  [90 %-98 %] 96 % (01/05 1330) Weight:  [242 lb 14.4 oz (110.2 kg)-242 lb 15.2 oz (110.2 kg)] 242 lb 15.2 oz (110.2 kg) (01/05 0500)  Hemodynamic parameters for last 24 hours:  stable blood pressure  Intake/Output from previous day: 01/04 0701 - 01/05 0700 In: 319.6 [P.O.:120; I.V.:49.6; IV Piggyback:150] Out: 1770 [Urine:1770] Intake/Output this shift: Total I/O In: 611 [P.O.:480; I.V.:131] Out: 2200 [Urine:2200]       Physical Exam  General: Chronically ill, disheveled HEENT: Normocephalic pupils  equal , dentition adequate Neck: Supple but with positive JVD,  No adenopathy, or bruit Chest: Bilateral rhonchi breath sounds, i, no tenderness             or deformity Cardiovascular: Regular rate and rhythm, 1-2/6 TR murmur, no gallop, peripheral pulses not palpable              Abdomen:  Soft, nontender, no palpable mass or organomegaly Extremities:              Mild edema with severe venous stasis changes of the legs bilaterally Rectal/GU: Deferred Neuro: Grossly non--focal and symmetrical throughout Skin:  dry without rash or ulceration    Lab Results:  Chest x-ray and CT scan shows a large loculated right pleural effusion. He has had a right thoracentesis.  Recent Labs  01/05/16 0110 01/07/16 0344  WBC 6.3 4.9  HGB 12.1* 12.5*  HCT 36.9* 39.4  PLT 121* 104*   BMET:  Recent Labs  01/06/16 1042 01/07/16 0344  NA 134* 137  K 3.4* 4.0  CL 96* 97*  CO2 26 32  GLUCOSE 139* 149*  BUN 25* 22*  CREATININE 1.32* 1.02  CALCIUM 8.5* 8.5*    PT/INR:  Recent Labs  01/07/16 0344  LABPROT 16.4*  INR 1.32   ABG    Component Value Date/Time   PHART 7.453 (H) 09/27/2012 1210   HCO3 28.4 (H) 09/27/2012 1210   TCO2 30 03/13/2015 0033   O2SAT 94.2 09/27/2012 1210   CBG (last 3)   Recent Labs  01/07/16 0749 01/07/16 1156 01/07/16 1816  GLUCAP 138* 168* 192*    Assessment/Plan: S/P Procedure(s) (LRB): Right/Left Heart Cath and Coronary Angiography (N/A) Patient does not  appear to be candidate for CABG for his ischemic cardiomyopathy. His functional reserve is very poor. He is minimally ambulatory. His pulmonary status is very poor. He does not appear to have any adequate conduit from the legs. We'll review results of saphenous vein mapping. Consider PCI of CAD if possible.  LOS: 2 days    Kathlee Nations Trigt III 01/07/2016

## 2016-01-07 NOTE — Progress Notes (Signed)
PROGRESS NOTE        PATIENT DETAILS Name: [name] Age: 61 y.o. Sex: male Date of Birth: 1955/01/17 Admit Date: 01/05/2016 Admitting Physician Eduard Clos, MD WUJ:WJXBJY,NWGN Larita Fife, MD  Brief Narrative: Patient is a 61 y.o. male with hx of ischemic cardiomyopathy (last EF 20-25 % on 01/03/15) presented with worsening SOB and left sided chest pain. Found to have acute on chronic systolic CHF and moderate pleural effusion-underwent thoracontesis on 1/4. See below for details.   Subjective: Still SOB with minimal activity-has pitting edema in his thighs. Just came back from cath lab.  Assessment/Plan: Acute Hypoxemic Resp Failure:multifactorial etiology-due to acute on chronic systolic CHF, CAP, pleural effusion. Continue diuresis, IV Abx and supportive care. Taper off O2 as tolerated  Acute on Chronic Systolic CHF (last EF 20-25 % on 01/03/15):Still decompensated, weight essentially unchanged at 243 lbs (from chart review baseline dry weight appears to be around 203-205 lbs). Thought to have worsening CAD likely causing decompensation (see below). Cardiac catheterization on 1/5 confirmed elevated left and right heart filling pressures. Cards following-plans are for eventual CABG when more compensated. Currently on Dobutamine gtt and IV lasix.   Right Pleural Effusion: Exudative by light's criteria, probably parapneumonic effusion. Pleural fluid cultures negative so far. Even though this is a exudative pleural effusion, given ascites-and significant volume overload-suspect some of it is due to CHF. Pleural fluid cytology negative.  CAP:CT chest confirms consolidation in the right middle lobe-continue Levaquin. Influenza PCR negative, blood cultures negative so far. Follow  Hx of CAD (MI 2005 LAD stent W/ BMS CFX, DES x 2 RCA):continue ASA,Coreg and statin. Trop negative. Underwent LHC on 1/5-thought to have extensive 3 vessel CAD with hemodynamically  significant stenosis in the proximal LAD, distal RCA and OM 1. Cardiology recommending CABG when more compensated in terms of his CHF  Hx of PVD-s/p L fem-pop 11/2014:continue ASA/Statin  Ischemic cardiomyopathy-s/p St. Jude ICD  DM-2:CBG's with Lantus 12 units, and SSI- follow  Dyslipidemia:Continue Lipitor and Zetia  Tobacco Abuse: counseled  DVT Prophylaxis: Prophylactic Lovenox   Code Status: Full code   Family Communication: None at bedside  Disposition Plan: Remain inpatient- will require several more days of hospitalization.  Antimicrobial agents: Anti-infectives    Start     Dose/Rate Route Frequency Ordered Stop   01/07/16 0600  levofloxacin (LEVAQUIN) IVPB 750 mg     750 mg 100 mL/hr over 90 Minutes Intravenous Every 24 hours 01/06/16 0826     01/06/16 0600  levofloxacin (LEVAQUIN) IVPB 500 mg  Status:  Discontinued     500 mg 100 mL/hr over 60 Minutes Intravenous Every 24 hours 01/05/16 0811 01/06/16 0826   01/05/16 0300  levofloxacin (LEVAQUIN) IVPB 500 mg     500 mg 100 mL/hr over 60 Minutes Intravenous  Once 01/05/16 0249 01/05/16 0604      Procedures: Thoracocentesis by IR 1/4>>  LHC/RHC 1/5>> 1. Elevated left and right heart filling pressures with high CVP/PCWP ratio.   2. Pulmonary venous hypertension.  3. Cardiac output acceptable on 2.5 dobutamine.  4. Extensive 3 vessel CAD with suspected hemodynamically significant stenoses in the proximal LAD, distal RCA, and OM1.    CONSULTS:  cardiology  Time spent: 25- minutes-Greater than 50% of this time was spent in counseling, explanation of diagnosis, planning of further management, and coordination of care.  MEDICATIONS:  Scheduled Meds: . aspirin EC  81 mg Oral Daily  . atorvastatin  80 mg Oral QODAY  . digoxin  0.125 mg Oral Daily  . ezetimibe  10 mg Oral Daily  . furosemide  120 mg Intravenous Q8H  . heparin  5,000 Units Subcutaneous Q8H  . insulin aspart  0-9 Units Subcutaneous TID WC  .  insulin glargine  12 Units Subcutaneous QHS  . levofloxacin (LEVAQUIN) IV  750 mg Intravenous Q24H  . metolazone  2.5 mg Oral Once  . potassium chloride  40 mEq Oral Daily  . sodium chloride flush  3 mL Intravenous Q12H  . sodium chloride flush  3 mL Intravenous Q12H  . sodium chloride flush  3 mL Intravenous Q12H  . sodium chloride flush  3 mL Intravenous Q12H  . spironolactone  12.5 mg Oral Daily   Continuous Infusions: . sodium chloride 10 mL/hr at 01/07/16 0700  . DOBUTamine 2.5 mcg/kg/min (01/07/16 0700)   PRN Meds:.sodium chloride, sodium chloride, sodium chloride, sodium chloride, acetaminophen, acetaminophen, guaiFENesin-dextromethorphan, ipratropium-albuterol, ondansetron (ZOFRAN) IV, ondansetron (ZOFRAN) IV, sodium chloride flush, sodium chloride flush, sodium chloride flush   PHYSICAL EXAM: Vital signs: Vitals:   01/07/16 0945 01/07/16 1000 01/07/16 1040 01/07/16 1157  BP: 124/65 123/62 132/69 120/61  Pulse:  83    Resp:  (!) 22 (!) 23 (!) 28  Temp:    98.5 F (36.9 C)  TempSrc:    Oral  SpO2: 95% 96% 96% 98%  Weight:      Height:       Filed Weights   01/06/16 0644 01/06/16 2019 01/07/16 0500  Weight: 109.9 kg (242 lb 3.2 oz) 110.2 kg (242 lb 14.4 oz) 110.2 kg (242 lb 15.2 oz)   Body mass index is 32.95 kg/m.   General appearance :Awake, alert, not in any distress.  Eyes:, pupils equally reactive to light and accomodation,no scleral icterus. HEENT: Atraumatic and Normocephalic Neck: supple, no JVD. No cervical lymphadenopathy. No thyromegaly Resp:Decreased air entry at right base-but otherwise clear CVS: S1 S2 regular, GI: Bowel sounds present, Non tender and not distended but obese with no gaurding, rigidity or rebound.No organomegaly Extremities: B/L Lower Ext shows + edema in his thighs- both legs are warm to touch Neurology:  speech clear,Non focal, sensation is grossly intact. Psychiatric: Normal judgment and insight. Alert and oriented x 3. Normal  mood. Musculoskeletal:No digital cyanosis Skin:chronic venous stasis changes with scaling in bilateral lower ext  I have personally reviewed following labs and imaging studies  LABORATORY DATA: CBC:  Recent Labs Lab 01/05/16 0110 01/07/16 0344  WBC 6.3 4.9  HGB 12.1* 12.5*  HCT 36.9* 39.4  MCV 84.8 88.5  PLT 121* 104*    Basic Metabolic Panel:  Recent Labs Lab 01/05/16 0110 01/06/16 1042 01/07/16 0344  NA 136 134* 137  K 3.0* 3.4* 4.0  CL 97* 96* 97*  CO2 28 26 32  GLUCOSE 72 139* 149*  BUN 16 25* 22*  CREATININE 1.05 1.32* 1.02  CALCIUM 8.4* 8.5* 8.5*  MG  --   --  1.9    GFR: Estimated Creatinine Clearance: 97.5 mL/min (by C-G formula based on SCr of 1.02 mg/dL).  Liver Function Tests:  Recent Labs Lab 01/06/16 1042  AST 27  ALT 15*  ALKPHOS 108  BILITOT 1.4*  PROT 7.1  ALBUMIN 2.9*   No results for input(s): LIPASE, AMYLASE in the last 168 hours. No results for input(s): AMMONIA in the last 168 hours.  Coagulation Profile:  Recent Labs Lab 01/07/16 0344  INR 1.32    Cardiac Enzymes:  Recent Labs Lab 01/05/16 1001 01/05/16 1913 01/06/16 0004 01/06/16 0336 01/06/16 1042  TROPONINI <0.03 <0.03 <0.03 <0.03 <0.03    BNP (last 3 results) No results for input(s): PROBNP in the last 8760 hours.  HbA1C:  Recent Labs  01/05/16 0833  HGBA1C 7.6*    CBG:  Recent Labs Lab 01/06/16 1103 01/06/16 1650 01/06/16 2014 01/07/16 0749 01/07/16 1156  GLUCAP 137* 144* 153* 138* 168*    Lipid Profile: No results for input(s): CHOL, HDL, LDLCALC, TRIG, CHOLHDL, LDLDIRECT in the last 72 hours.  Thyroid Function Tests: No results for input(s): TSH, T4TOTAL, FREET4, T3FREE, THYROIDAB in the last 72 hours.  Anemia Panel: No results for input(s): VITAMINB12, FOLATE, FERRITIN, TIBC, IRON, RETICCTPCT in the last 72 hours.  Urine analysis:    Component Value Date/Time   COLORURINE AMBER (A) 01/05/2016 2353   APPEARANCEUR HAZY (A)  01/05/2016 2353   LABSPEC 1.028 01/05/2016 2353   PHURINE 5.0 01/05/2016 2353   GLUCOSEU NEGATIVE 01/05/2016 2353   HGBUR SMALL (A) 01/05/2016 2353   BILIRUBINUR NEGATIVE 01/05/2016 2353   KETONESUR NEGATIVE 01/05/2016 2353   PROTEINUR NEGATIVE 01/05/2016 2353   UROBILINOGEN 1.0 11/06/2014 0906   NITRITE NEGATIVE 01/05/2016 2353   LEUKOCYTESUR MODERATE (A) 01/05/2016 2353    Sepsis Labs: Lactic Acid, Venous    Component Value Date/Time   LATICACIDVEN 1.23 01/05/2016 0413    MICROBIOLOGY: Recent Results (from the past 240 hour(s))  Culture, blood (routine x 2) Call MD if unable to obtain prior to antibiotics being given     Status: None (Preliminary result)   Collection Time: 01/05/16  1:25 AM  Result Value Ref Range Status   Specimen Description BLOOD RIGHT ANTECUBITAL  Final   Special Requests BOTTLES DRAWN AEROBIC AND ANAEROBIC 5CC  Final   Culture NO GROWTH 1 DAY  Final   Report Status PENDING  Incomplete  Culture, blood (routine x 2) Call MD if unable to obtain prior to antibiotics being given     Status: None (Preliminary result)   Collection Time: 01/05/16  8:29 AM  Result Value Ref Range Status   Specimen Description BLOOD RIGHT ANTECUBITAL  Final   Special Requests BOTTLES DRAWN AEROBIC AND ANAEROBIC  5CC  Final   Culture NO GROWTH 1 DAY  Final   Report Status PENDING  Incomplete  Culture, body fluid-bottle     Status: None (Preliminary result)   Collection Time: 01/05/16 10:24 AM  Result Value Ref Range Status   Specimen Description FLUID PERITONEAL  Final   Special Requests NONE  Final   Culture NO GROWTH 1 DAY  Final   Report Status PENDING  Incomplete  Gram stain     Status: None   Collection Time: 01/05/16 10:24 AM  Result Value Ref Range Status   Specimen Description FLUID PERITONEAL  Final   Special Requests NONE  Final   Gram Stain   Final    RARE WBC PRESENT, PREDOMINANTLY MONONUCLEAR NO ORGANISMS SEEN    Report Status 01/05/2016 FINAL  Final    Culture, sputum-assessment     Status: None   Collection Time: 01/05/16  9:09 PM  Result Value Ref Range Status   Specimen Description SPUTUM  Final   Special Requests NONE  Final   Sputum evaluation THIS SPECIMEN IS ACCEPTABLE FOR SPUTUM CULTURE  Final   Report Status 01/06/2016 FINAL  Final  Culture, respiratory (NON-Expectorated)  Status: None (Preliminary result)   Collection Time: 01/05/16  9:09 PM  Result Value Ref Range Status   Specimen Description SPUTUM  Final   Special Requests NONE Reflexed from [id number]  Final   Gram Stain   Final    ABUNDANT WBC PRESENT, PREDOMINANTLY PMN ABUNDANT GRAM POSITIVE COCCI IN CLUSTERS IN PAIRS FEW GRAM NEGATIVE RODS FEW GRAM POSITIVE RODS    Culture CULTURE REINCUBATED FOR BETTER GROWTH  Final   Report Status PENDING  Incomplete  MRSA PCR Screening     Status: None   Collection Time: 01/06/16  8:21 PM  Result Value Ref Range Status   MRSA by PCR NEGATIVE NEGATIVE Final    Comment:        The GeneXpert MRSA Assay (FDA approved for NASAL specimens only), is one component of a comprehensive MRSA colonization surveillance program. It is not intended to diagnose MRSA infection nor to guide or monitor treatment for MRSA infections.     RADIOLOGY STUDIES/RESULTS: Dg Chest 1 View  Result Date: 01/05/2016 CLINICAL DATA:  Post right thoracentesis. EXAM: CHEST 1 VIEW COMPARISON:  01/05/2016 FINDINGS: Moderate right pleural effusion with right lower lobe atelectasis or infiltrate. No pneumothorax following thoracentesis. Mild cardiomegaly. Left AICD is unchanged. Left lung is clear. IMPRESSION: Moderate right pleural effusion with right lower lobe atelectasis or infiltrate. No pneumothorax. Electronically Signed   By: Charlett NoseKevin  Dover M.D.   On: 01/05/2016 10:38   Dg Chest 2 View  Result Date: 01/06/2016 CLINICAL DATA:  Three weeks of coughing and chest pain. History of CHF, community-acquired pneumonia, pleural effusions, and coronary artery  disease. The patient has a permanent pacemaker defibrillator. EXAM: CHEST  2 VIEW COMPARISON:  Portable chest x-ray of January 05, 2016 FINDINGS: There remains increased density in the mid and lower right lung compatible with pleural fluid layering posteriorly. There is a tiny left pleural effusion. The left lung is well-expanded. There are coarse lung markings posteriorly in the right lower lung. The interstitial markings are increased bilaterally. The cardiac silhouette is enlarged. The pulmonary vascularity is engorged. The ICD is in stable position. There is calcification in the wall of the aortic arch. IMPRESSION: There is a moderate-sized right pleural effusion and trace left pleural effusion. There is pulmonary interstitial edema secondary to CHF. There may be right basilar atelectasis or pneumonia. There has been slight interval deterioration in the appearance of the pulmonary interstitium bilaterally since yesterday's study. Thoracic aortic atherosclerosis. Electronically Signed   By: David  [place]azil[place] M.D.   On: 01/06/2016 08:05   Dg Chest 2 View  Result Date: 01/05/2016 CLINICAL DATA:  Chest pain, cough. History of diabetes, cardiomyopathy/CHF. History of RIGHT thoracentesis. EXAM: CHEST  2 VIEW COMPARISON:  Chest radiograph January 07, 2015 and CT chest January 02, 2015 FINDINGS: Moderate to large RIGHT pleural effusion and underlying consolidation. Mild bronchitic changes. RIGHT heart border obscured. Mildly calcified aortic knob. Single lead LEFT cardiac pacemaker in situ. No pneumothorax. Old RIGHT lateral rib fractures. IMPRESSION: Moderate to large recurrent RIGHT pleural effusion with underlying consolidation, recommend follow up to exclude mass. Mild bronchitic changes. Electronically Signed   By: Awilda Metroourtnay  Bloomer M.D.   On: 01/05/2016 02:04   Ct Chest W Contrast  Result Date: 01/05/2016 CLINICAL DATA:  Sudden onset of left-sided chest pain EXAM: CT CHEST WITH CONTRAST TECHNIQUE: Multidetector  CT imaging of the chest was performed during intravenous contrast administration. CONTRAST:  75mL ISOVUE-300 IOPAMIDOL (ISOVUE-300) INJECTION 61% COMPARISON:  Chest radiograph 01/05/2016, CT chest 01/02/2015 FINDINGS:  Cardiovascular: Atherosclerotic calcifications within the aorta. No aneurysm. Extensive coronary artery calcifications. Heart size upper normal. Partially visualized cardiac pacing lead. No large pericardial effusion. Mediastinum/Nodes: Stable nonspecific mediastinal lymph nodes. Trachea is midline. Thyroid gland unremarkable. Esophagus is within normal limits. Lungs/Pleura: Large right-sided pleural effusion with consolidation in the right lower lobe and partial consolidation in the right middle lobe. The left lung demonstrates scattered foci of hazy density in the lingula and left lower lobe which may reflect diffuse atelectasis or possibly mild edema. No pneumothorax. No left effusion. Upper Abdomen: Upper abdominal structures show no acute abnormalities. Ascites adjacent to the spleen and liver. Musculoskeletal: No suspicious bone lesions. Old right-sided rib fractures IMPRESSION: 1. Large right-sided pleural effusion with consolidation in the right lower lobe. Partial consolidation in the right middle lobe, cannot rule out a pneumonia. No significant left pleural effusion. Scattered mild densities within the left upper and lower lobes could reflect scattered foci of atelectasis or mild edema. 2. Ascites in the upper abdomen 3. No change in mild mediastinal adenopathy. Electronically Signed   By: Jasmine Pang M.D.   On: 01/05/2016 03:44   US Thoracentesis Asp Pleural Space W/img Guide  Result Date: 01/05/2016 INDICATION: Congestive heart failure. History of right pleural effusion one year ago. Right chest pain. Large right pleural effusion. Request for diagnostic and therapeutic thoracentesis. EXAM: ULTRASOUND GUIDED RIGHT THORACENTESIS MEDICATIONS: 1% Lidocaine = 10 ml. COMPLICATIONS: None  immediate. PROCEDURE: An ultrasound guided thoracentesis was thoroughly discussed with the patient and questions answered. The benefits, risks, alternatives and complications were also discussed. The patient understands and wishes to proceed with the procedure. Written consent was obtained. Ultrasound was performed to localize and mark an adequate pocket of fluid in the right chest. The area was then prepped and draped in the normal sterile fashion. 1% Lidocaine was used for local anesthesia. Under ultrasound guidance a 6 Fr Safe-T-Centesis catheter was introduced. Thoracentesis was performed. The catheter was removed and a dressing applied. FINDINGS: A total of approximately 2 liters of clear yellow fluid was removed. Samples were sent to the laboratory as requested by the clinical team. IMPRESSION: Successful ultrasound guided right thoracentesis yielding 2 liters of pleural fluid. Post procedure ultrasound revealed remaining moderate effusion. Recommend repeat thoracentesis tomorrow. Read by:  Corrin Parker, PA-C Electronically Signed   By: Judie Petit.  Shick M.D.   On: 01/05/2016 10:23     LOS: 2 days   Jeoffrey Massed, MD  [facility] Pager:[phone]  If 7PM-7AM, please contact night-coverage [email or web address] Password [email or web address] 01/07/2016, 12:53 PM

## 2016-01-08 ENCOUNTER — Inpatient Hospital Stay (HOSPITAL_COMMUNITY): Payer: BLUE CROSS/BLUE SHIELD

## 2016-01-08 DIAGNOSIS — E876 Hypokalemia: Secondary | ICD-10-CM

## 2016-01-08 DIAGNOSIS — Z01810 Encounter for preprocedural cardiovascular examination: Secondary | ICD-10-CM

## 2016-01-08 LAB — COOXEMETRY PANEL
Carboxyhemoglobin: 1.5 % (ref 0.5–1.5)
Methemoglobin: 1 % (ref 0.0–1.5)
O2 SAT: 62.8 %
TOTAL HEMOGLOBIN: 12.7 g/dL (ref 12.0–16.0)

## 2016-01-08 LAB — CBC WITH DIFFERENTIAL/PLATELET
Basophils Absolute: 0 10*3/uL (ref 0.0–0.1)
Basophils Relative: 0 %
EOS PCT: 1 %
Eosinophils Absolute: 0 10*3/uL (ref 0.0–0.7)
HCT: 39.1 % (ref 39.0–52.0)
HEMOGLOBIN: 12.8 g/dL — AB (ref 13.0–17.0)
LYMPHS ABS: 0.5 10*3/uL — AB (ref 0.7–4.0)
LYMPHS PCT: 10 %
MCH: 28.1 pg (ref 26.0–34.0)
MCHC: 32.7 g/dL (ref 30.0–36.0)
MCV: 85.7 fL (ref 78.0–100.0)
MONOS PCT: 12 %
Monocytes Absolute: 0.7 10*3/uL (ref 0.1–1.0)
NEUTROS PCT: 77 %
Neutro Abs: 4.1 10*3/uL (ref 1.7–7.7)
Platelets: 105 10*3/uL — ABNORMAL LOW (ref 150–400)
RBC: 4.56 MIL/uL (ref 4.22–5.81)
RDW: 18.4 % — ABNORMAL HIGH (ref 11.5–15.5)
WBC: 5.3 10*3/uL (ref 4.0–10.5)

## 2016-01-08 LAB — BASIC METABOLIC PANEL
Anion gap: 11 (ref 5–15)
BUN: 15 mg/dL (ref 6–20)
CHLORIDE: 89 mmol/L — AB (ref 101–111)
CO2: 33 mmol/L — ABNORMAL HIGH (ref 22–32)
Calcium: 8.6 mg/dL — ABNORMAL LOW (ref 8.9–10.3)
Creatinine, Ser: 0.78 mg/dL (ref 0.61–1.24)
GFR calc Af Amer: >60 mL/min (ref >60)
GFR calc non Af Amer: >60 mL/min (ref >60)
GLUCOSE: 167 mg/dL — AB (ref 65–99)
POTASSIUM: 3.2 mmol/L — AB (ref 3.5–5.1)
Sodium: 133 mmol/L — ABNORMAL LOW (ref 135–145)

## 2016-01-08 LAB — GLUCOSE, CAPILLARY
GLUCOSE-CAPILLARY: 106 mg/dL — AB (ref 65–99)
GLUCOSE-CAPILLARY: 186 mg/dL — AB (ref 65–99)
Glucose-Capillary: 174 mg/dL — ABNORMAL HIGH (ref 65–99)
Glucose-Capillary: 225 mg/dL — ABNORMAL HIGH (ref 65–99)

## 2016-01-08 LAB — CULTURE, RESPIRATORY

## 2016-01-08 LAB — MAGNESIUM: Magnesium: 1.6 mg/dL — ABNORMAL LOW (ref 1.7–2.4)

## 2016-01-08 LAB — CULTURE, RESPIRATORY W GRAM STAIN: Culture: NORMAL

## 2016-01-08 MED ORDER — MAGNESIUM SULFATE 2 GM/50ML IV SOLN
2.0000 g | Freq: Once | INTRAVENOUS | Status: AC
  Administered 2016-01-08: 2 g via INTRAVENOUS
  Filled 2016-01-08: qty 50

## 2016-01-08 MED ORDER — POTASSIUM CHLORIDE CRYS ER 20 MEQ PO TBCR
40.0000 meq | EXTENDED_RELEASE_TABLET | Freq: Four times a day (QID) | ORAL | Status: DC
  Administered 2016-01-08: 40 meq via ORAL
  Filled 2016-01-08 (×2): qty 2

## 2016-01-08 MED ORDER — POTASSIUM CHLORIDE CRYS ER 20 MEQ PO TBCR: 40.0000 meq | EXTENDED_RELEASE_TABLET | Freq: Every day | ORAL | Status: DC

## 2016-01-08 MED ORDER — HYDROCOD POLST-CPM POLST ER 10-8 MG/5ML PO SUER
5.0000 mL | Freq: Once | ORAL | Status: AC
  Administered 2016-01-08: 5 mL via ORAL
  Filled 2016-01-08: qty 5

## 2016-01-08 MED ORDER — POTASSIUM CHLORIDE 20 MEQ PO PACK
40.0000 meq | PACK | Freq: Every day | ORAL | Status: DC
  Administered 2016-01-08 – 2016-01-09 (×2): 40 meq via ORAL
  Filled 2016-01-08 (×3): qty 2

## 2016-01-08 MED ORDER — SPIRONOLACTONE 25 MG PO TABS
25.0000 mg | ORAL_TABLET | Freq: Every day | ORAL | Status: DC
  Administered 2016-01-09 – 2016-01-11 (×3): 25 mg via ORAL
  Filled 2016-01-08 (×3): qty 1

## 2016-01-08 NOTE — Progress Notes (Signed)
PROGRESS NOTE        PATIENT DETAILS Name: [name] Age: 61 y.o. Sex: male Date of Birth: Sep 01, 1955 Admit Date: 01/05/2016 Admitting Physician Eduard Clos, MD ZOX:[id number] Larita Fife, MD  Brief Narrative: Patient is a 61 y.o. male with hx of ischemic cardiomyopathy (last EF 20-25 % on 01/03/15) presented with worsening SOB and left sided chest pain. Found to have acute on chronic systolic CHF and moderate pleural effusion-underwent thoracontesis on 1/4. See below for details.   Subjective:  Sitting up in bed, no headache, no chest or abdominal pain. Today not short of breath.  Assessment/Plan: Acute Hypoxemic Resp Failure: multifactorial etiology-due to acute on chronic systolic CHF, CAP, pleural effusion. Continue diuresis, IV Abx and supportive care. Taper off O2 as tolerated, clinically better.  Acute on Chronic Systolic CHF (last EF 20-25 % on 01/03/15) due to ischemic cardiomyopathy:Still decompensated, etiology following on IV Lasix and dobutamine drip, oral digoxin, blood pressure too low for Coreg, ACE/ARB will defer management to cardiology. Seen by cardiac thoracic surgery age. Medical management per cardiology.  Right Pleural Effusion: Exudative by light's criteria, probably parapneumonic effusion. Pleural fluid cultures negative so far. Even though this is a exudative pleural effusion, given ascites-and significant volume overload-suspect some of it is due to CHF. Pleural fluid cytology negative. So far 8 L negative.  CAP:CT chest confirms consolidation in the right middle lobe-continue Levaquin. Influenza PCR negative, blood cultures negative so far. Follow  Hx of CAD (MI 2005 LAD stent W/ BMS CFX, DES x 2 RCA): continue ASA and statin for secondary prevention, no Coreg for now per cardiology as per pressure was low, chest pain-free, severe three-vessel disease per left heart cath however per cardiac thoracic surgery which. Defer medical  management to cardiology.  Hx of PVD-s/p L fem-pop 11/2014:continue ASA/Statin  Ischemic cardiomyopathy-s/p St. Jude ICD  Dyslipidemia:Continue Lipitor and Zetia  Tobacco Abuse: counseled  Hypokalemia and hypomagnesemia. Replaced will monitor.   DM-2:CBG's with Lantus 12 units, and SSI- follow  CBG (last 3)   Recent Labs  01/07/16 2157 01/08/16 0826 01/08/16 1206  GLUCAP 158* 174* 225*     DVT Prophylaxis: Prophylactic Lovenox   Code Status: Full code   Family Communication: None at bedside  Disposition Plan: Remain inpatient- will require several more days of hospitalization.  Antimicrobial agents: Anti-infectives    Start     Dose/Rate Route Frequency Ordered Stop   01/07/16 0600  levofloxacin (LEVAQUIN) IVPB 750 mg     750 mg 100 mL/hr over 90 Minutes Intravenous Every 24 hours 01/06/16 0826     01/06/16 0600  levofloxacin (LEVAQUIN) IVPB 500 mg  Status:  Discontinued     500 mg 100 mL/hr over 60 Minutes Intravenous Every 24 hours 01/05/16 0811 01/06/16 0826   01/05/16 0300  levofloxacin (LEVAQUIN) IVPB 500 mg     500 mg 100 mL/hr over 60 Minutes Intravenous  Once 01/05/16 0249 01/05/16 0604      Procedures:  TEE - Compared to a prior study in 01/2015, the LVEF is unchanged at around 20% with severe global hypokinesis.  Thoracocentesis by IR 1/4>> borderline exudate  LHC/RHC 1/5>> 1. Elevated left and right heart filling pressures with high CVP/PCWP ratio.   2. Pulmonary venous hypertension.  3. Cardiac output acceptable on 2.5 dobutamine.  4. Extensive 3 vessel CAD with suspected hemodynamically significant  stenoses in the proximal LAD, distal RCA, and OM1.    CONSULTS:  cardiology  Time spent: 25- minutes-Greater than 50% of this time was spent in counseling, explanation of diagnosis, planning of further management, and coordination of care.  MEDICATIONS: Scheduled Meds: . aspirin EC  81 mg Oral Daily  . atorvastatin  80 mg Oral QODAY  .  digoxin  0.125 mg Oral Daily  . ezetimibe  10 mg Oral Daily  . furosemide  120 mg Intravenous Q8H  . heparin  5,000 Units Subcutaneous Q8H  . insulin aspart  0-9 Units Subcutaneous TID WC  . insulin glargine  12 Units Subcutaneous QHS  . levofloxacin (LEVAQUIN) IV  750 mg Intravenous Q24H  . potassium chloride  40 mEq Oral Q6H  . [START ON 01/09/2016] potassium chloride  40 mEq Oral Daily  . sodium chloride flush  3 mL Intravenous Q12H  . [START ON 01/09/2016] spironolactone  25 mg Oral Daily   Continuous Infusions: . DOBUTamine 2.5 mcg/kg/min (01/07/16 0700)   PRN Meds:.sodium chloride, acetaminophen, guaiFENesin-dextromethorphan, ipratropium-albuterol, ondansetron (ZOFRAN) IV, ondansetron (ZOFRAN) IV   PHYSICAL EXAM: Vital signs: Vitals:   01/08/16 0942 01/08/16 1000 01/08/16 1100 01/08/16 1200  BP:  118/60 119/62 113/64  Pulse:      Resp: 12  (!) 22 (!) 26  Temp:    98.2 F (36.8 C)  TempSrc:    Oral  SpO2: 95% 97% 100% 95%  Weight:      Height:       Filed Weights   01/06/16 2019 01/07/16 0500 01/08/16 0354  Weight: 110.2 kg (242 lb 14.4 oz) 110.2 kg (242 lb 15.2 oz) 103.8 kg (228 lb 14.4 oz)   Body mass index is 31.04 kg/m.   General appearance :Awake, alert, not in any distress.  Eyes:, pupils equally reactive to light and accomodation,no scleral icterus. HEENT: Atraumatic and Normocephalic Neck: supple, no JVD. No cervical lymphadenopathy. No thyromegaly Resp:Decreased air entry at right base-but otherwise clear CVS: S1 S2 regular, GI: Bowel sounds present, Non tender and not distended but obese with no gaurding, rigidity or rebound.No organomegaly Extremities: B/L Lower Ext shows + edema in his thighs- both legs are warm to touch Neurology:  speech clear,Non focal, sensation is grossly intact. Psychiatric: Normal judgment and insight. Alert and oriented x 3. Normal mood. Musculoskeletal:No digital cyanosis Skin:chronic venous stasis changes with scaling in  bilateral lower ext  I have personally reviewed following labs and imaging studies  LABORATORY DATA: CBC:  Recent Labs Lab 01/05/16 0110 01/07/16 0344 01/08/16 0825  WBC 6.3 4.9 5.3  NEUTROABS  --   --  4.1  HGB 12.1* 12.5* 12.8*  HCT 36.9* 39.4 39.1  MCV 84.8 88.5 85.7  PLT 121* 104* 105*    Basic Metabolic Panel:  Recent Labs Lab 01/05/16 0110 01/06/16 1042 01/07/16 0344 01/08/16 0825  NA 136 134* 137 133*  K 3.0* 3.4* 4.0 3.2*  CL 97* 96* 97* 89*  CO2 28 26 32 33*  GLUCOSE 72 139* 149* 167*  BUN 16 25* 22* 15  CREATININE 1.05 1.32* 1.02 0.78  CALCIUM 8.4* 8.5* 8.5* 8.6*  MG  --   --  1.9 1.6*    GFR: Estimated Creatinine Clearance: 120.8 mL/min (by C-G formula based on SCr of 0.78 mg/dL).  Liver Function Tests:  Recent Labs Lab 01/06/16 1042  AST 27  ALT 15*  ALKPHOS 108  BILITOT 1.4*  PROT 7.1  ALBUMIN 2.9*   No results for input(s): LIPASE,  AMYLASE in the last 168 hours. No results for input(s): AMMONIA in the last 168 hours.  Coagulation Profile:  Recent Labs Lab 01/07/16 0344  INR 1.32    Cardiac Enzymes:  Recent Labs Lab 01/05/16 1001 01/05/16 1913 01/06/16 0004 01/06/16 0336 01/06/16 1042  TROPONINI <0.03 <0.03 <0.03 <0.03 <0.03    BNP (last 3 results) No results for input(s): PROBNP in the last 8760 hours.  HbA1C: No results for input(s): HGBA1C in the last 72 hours.  CBG:  Recent Labs Lab 01/07/16 1156 01/07/16 1816 01/07/16 2157 01/08/16 0826 01/08/16 1206  GLUCAP 168* 192* 158* 174* 225*    Lipid Profile: No results for input(s): CHOL, HDL, LDLCALC, TRIG, CHOLHDL, LDLDIRECT in the last 72 hours.  Thyroid Function Tests: No results for input(s): TSH, T4TOTAL, FREET4, T3FREE, THYROIDAB in the last 72 hours.  Anemia Panel: No results for input(s): VITAMINB12, FOLATE, FERRITIN, TIBC, IRON, RETICCTPCT in the last 72 hours.  Urine analysis:    Component Value Date/Time   COLORURINE AMBER (A) 01/05/2016  2353   APPEARANCEUR HAZY (A) 01/05/2016 2353   LABSPEC 1.028 01/05/2016 2353   PHURINE 5.0 01/05/2016 2353   GLUCOSEU NEGATIVE 01/05/2016 2353   HGBUR SMALL (A) 01/05/2016 2353   BILIRUBINUR NEGATIVE 01/05/2016 2353   KETONESUR NEGATIVE 01/05/2016 2353   PROTEINUR NEGATIVE 01/05/2016 2353   UROBILINOGEN 1.0 11/06/2014 0906   NITRITE NEGATIVE 01/05/2016 2353   LEUKOCYTESUR MODERATE (A) 01/05/2016 2353    Sepsis Labs: Lactic Acid, Venous    Component Value Date/Time   LATICACIDVEN 1.23 01/05/2016 0413    MICROBIOLOGY: Recent Results (from the past 240 hour(s))  Culture, blood (routine x 2) Call MD if unable to obtain prior to antibiotics being given     Status: None (Preliminary result)   Collection Time: 01/05/16  1:25 AM  Result Value Ref Range Status   Specimen Description BLOOD RIGHT ANTECUBITAL  Final   Special Requests BOTTLES DRAWN AEROBIC AND ANAEROBIC 5CC  Final   Culture NO GROWTH 3 DAYS  Final   Report Status PENDING  Incomplete  Culture, blood (routine x 2) Call MD if unable to obtain prior to antibiotics being given     Status: None (Preliminary result)   Collection Time: 01/05/16  8:29 AM  Result Value Ref Range Status   Specimen Description BLOOD RIGHT ANTECUBITAL  Final   Special Requests BOTTLES DRAWN AEROBIC AND ANAEROBIC  5CC  Final   Culture NO GROWTH 3 DAYS  Final   Report Status PENDING  Incomplete  Culture, body fluid-bottle     Status: None (Preliminary result)   Collection Time: 01/05/16 10:24 AM  Result Value Ref Range Status   Specimen Description FLUID PERITONEAL  Final   Special Requests NONE  Final   Culture NO GROWTH 3 DAYS  Final   Report Status PENDING  Incomplete  Gram stain     Status: None   Collection Time: 01/05/16 10:24 AM  Result Value Ref Range Status   Specimen Description FLUID PERITONEAL  Final   Special Requests NONE  Final   Gram Stain   Final    RARE WBC PRESENT, PREDOMINANTLY MONONUCLEAR NO ORGANISMS SEEN    Report  Status 01/05/2016 FINAL  Final  Culture, sputum-assessment     Status: None   Collection Time: 01/05/16  9:09 PM  Result Value Ref Range Status   Specimen Description SPUTUM  Final   Special Requests NONE  Final   Sputum evaluation THIS SPECIMEN IS ACCEPTABLE FOR SPUTUM  CULTURE  Final   Report Status 01/06/2016 FINAL  Final  Culture, respiratory (NON-Expectorated)     Status: None   Collection Time: 01/05/16  9:09 PM  Result Value Ref Range Status   Specimen Description SPUTUM  Final   Special Requests NONE Reflexed from [id number]  Final   Gram Stain   Final    ABUNDANT WBC PRESENT, PREDOMINANTLY PMN ABUNDANT GRAM POSITIVE COCCI IN CLUSTERS IN PAIRS FEW GRAM NEGATIVE RODS FEW GRAM POSITIVE RODS    Culture Consistent with normal respiratory flora.  Final   Report Status 01/08/2016 FINAL  Final  MRSA PCR Screening     Status: None   Collection Time: 01/06/16  8:21 PM  Result Value Ref Range Status   MRSA by PCR NEGATIVE NEGATIVE Final    Comment:        The GeneXpert MRSA Assay (FDA approved for NASAL specimens only), is one component of a comprehensive MRSA colonization surveillance program. It is not intended to diagnose MRSA infection nor to guide or monitor treatment for MRSA infections.     RADIOLOGY STUDIES/RESULTS: Dg Chest 1 View  Result Date: 01/05/2016 CLINICAL DATA:  Post right thoracentesis. EXAM: CHEST 1 VIEW COMPARISON:  01/05/2016 FINDINGS: Moderate right pleural effusion with right lower lobe atelectasis or infiltrate. No pneumothorax following thoracentesis. Mild cardiomegaly. Left AICD is unchanged. Left lung is clear. IMPRESSION: Moderate right pleural effusion with right lower lobe atelectasis or infiltrate. No pneumothorax. Electronically Signed   By: Charlett NoseKevin  Dover M.D.   On: 01/05/2016 10:38   Dg Chest 2 View  Result Date: 01/06/2016 CLINICAL DATA:  Three weeks of coughing and chest pain. History of CHF, community-acquired pneumonia, pleural effusions, and  coronary artery disease. The patient has a permanent pacemaker defibrillator. EXAM: CHEST  2 VIEW COMPARISON:  Portable chest x-ray of January 05, 2016 FINDINGS: There remains increased density in the mid and lower right lung compatible with pleural fluid layering posteriorly. There is a tiny left pleural effusion. The left lung is well-expanded. There are coarse lung markings posteriorly in the right lower lung. The interstitial markings are increased bilaterally. The cardiac silhouette is enlarged. The pulmonary vascularity is engorged. The ICD is in stable position. There is calcification in the wall of the aortic arch. IMPRESSION: There is a moderate-sized right pleural effusion and trace left pleural effusion. There is pulmonary interstitial edema secondary to CHF. There may be right basilar atelectasis or pneumonia. There has been slight interval deterioration in the appearance of the pulmonary interstitium bilaterally since yesterday's study. Thoracic aortic atherosclerosis. Electronically Signed   By: David  SwazilandJordan M.D.   On: 01/06/2016 08:05   Dg Chest 2 View  Result Date: 01/05/2016 CLINICAL DATA:  Chest pain, cough. History of diabetes, cardiomyopathy/CHF. History of RIGHT thoracentesis. EXAM: CHEST  2 VIEW COMPARISON:  Chest radiograph January 07, 2015 and CT chest January 02, 2015 FINDINGS: Moderate to large RIGHT pleural effusion and underlying consolidation. Mild bronchitic changes. RIGHT heart border obscured. Mildly calcified aortic knob. Single lead LEFT cardiac pacemaker in situ. No pneumothorax. Old RIGHT lateral rib fractures. IMPRESSION: Moderate to large recurrent RIGHT pleural effusion with underlying consolidation, recommend follow up to exclude mass. Mild bronchitic changes. Electronically Signed   By: Awilda Metroourtnay  Bloomer M.D.   On: 01/05/2016 02:04   Ct Chest W Contrast  Result Date: 01/05/2016 CLINICAL DATA:  Sudden onset of left-sided chest pain EXAM: CT CHEST WITH CONTRAST TECHNIQUE:  Multidetector CT imaging of the chest was performed during intravenous contrast  administration. CONTRAST:  75mL ISOVUE-300 IOPAMIDOL (ISOVUE-300) INJECTION 61% COMPARISON:  Chest radiograph 01/05/2016, CT chest 01/02/2015 FINDINGS: Cardiovascular: Atherosclerotic calcifications within the aorta. No aneurysm. Extensive coronary artery calcifications. Heart size upper normal. Partially visualized cardiac pacing lead. No large pericardial effusion. Mediastinum/Nodes: Stable nonspecific mediastinal lymph nodes. Trachea is midline. Thyroid gland unremarkable. Esophagus is within normal limits. Lungs/Pleura: Large right-sided pleural effusion with consolidation in the right lower lobe and partial consolidation in the right middle lobe. The left lung demonstrates scattered foci of hazy density in the lingula and left lower lobe which may reflect diffuse atelectasis or possibly mild edema. No pneumothorax. No left effusion. Upper Abdomen: Upper abdominal structures show no acute abnormalities. Ascites adjacent to the spleen and liver. Musculoskeletal: No suspicious bone lesions. Old right-sided rib fractures IMPRESSION: 1. Large right-sided pleural effusion with consolidation in the right lower lobe. Partial consolidation in the right middle lobe, cannot rule out a pneumonia. No significant left pleural effusion. Scattered mild densities within the left upper and lower lobes could reflect scattered foci of atelectasis or mild edema. 2. Ascites in the upper abdomen 3. No change in mild mediastinal adenopathy. Electronically Signed   By: Jasmine Pang M.D.   On: 01/05/2016 03:44   US Thoracentesis Asp Pleural Space W/img Guide  Result Date: 01/05/2016 INDICATION: Congestive heart failure. History of right pleural effusion one year ago. Right chest pain. Large right pleural effusion. Request for diagnostic and therapeutic thoracentesis. EXAM: ULTRASOUND GUIDED RIGHT THORACENTESIS MEDICATIONS: 1% Lidocaine = 10 ml.  COMPLICATIONS: None immediate. PROCEDURE: An ultrasound guided thoracentesis was thoroughly discussed with the patient and questions answered. The benefits, risks, alternatives and complications were also discussed. The patient understands and wishes to proceed with the procedure. Written consent was obtained. Ultrasound was performed to localize and mark an adequate pocket of fluid in the right chest. The area was then prepped and draped in the normal sterile fashion. 1% Lidocaine was used for local anesthesia. Under ultrasound guidance a 6 Fr Safe-T-Centesis catheter was introduced. Thoracentesis was performed. The catheter was removed and a dressing applied. FINDINGS: A total of approximately 2 liters of clear yellow fluid was removed. Samples were sent to the laboratory as requested by the clinical team. IMPRESSION: Successful ultrasound guided right thoracentesis yielding 2 liters of pleural fluid. Post procedure ultrasound revealed remaining moderate effusion. Recommend repeat thoracentesis tomorrow. Read by:  Corrin Parker, PA-C Electronically Signed   By: Judie Petit.  Shick M.D.   On: 01/05/2016 10:23     LOS: 3 days   Leroy Sea, MD  [facility] Pager:[phone]  If 7PM-7AM, please contact night-coverage [email or web address] Password [email or web address] 01/08/2016, 12:16 PM

## 2016-01-08 NOTE — Evaluation (Signed)
Physical Therapy Evaluation Patient Details Name: Dalton RainwaterLarry D Hohn MRN: [id number] DOB: 05-18-1955 Today's Date: 01/08/2016   History of Present Illness  Pt is a 61 y/o male admitted secondary to SOB and cough. Pt found to have an acute CHF exacerbation with volume overload. PMH including but not limited to DM, CHF, CAD, HTN, PVD, AICD (2010) and fem-pop bypass grafts in 2015 and 2016.  Clinical Impression  Pt presented supine in bed with HOB elevated, awake and willing to participate in therapy session. 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 Pt did report two recent falls in which he tripped over something at work and his legs were "tangled up together". Pt with 3L of O2 via [place] when PT entered room. Pt ambulated 50' of RA with SPO2 decreasing to mid 80's. With sitting rest break, pt's SPO2 increased to 90%. PT reapplied supplemental O2 at end of session. All other VSS throughout. Pt would continue to benefit from skilled physical therapy services at this time while admitted and after d/c to address his below listed limitations in order to improve his overall safety and independence with functional mobility.      Follow Up Recommendations Home health PT    Equipment Recommendations  None recommended by PT    Recommendations for Other Services       Precautions / Restrictions Precautions Precautions: Fall Restrictions Weight Bearing Restrictions: No      Mobility  Bed Mobility Overal bed mobility: Modified Independent             General bed mobility comments: increased time and effort with use of bed rails  Transfers Overall transfer level: Needs assistance Equipment used: None Transfers: Sit to/from Stand Sit to Stand: Min guard         General transfer comment: pt required increased time, min guard for safety, no instability or  LOB  Ambulation/Gait Ambulation/Gait assistance: Min guard Ambulation Distance (Feet): 50 Feet Assistive device: None Gait Pattern/deviations: Step-through pattern;Decreased stride length Gait velocity: decreased Gait velocity interpretation: Below normal speed for age/gender General Gait Details: pt with mild instability but no LOB or physical assistance needed, very slow cadence, min guard for safety  Stairs            Wheelchair Mobility    Modified Rankin (Stroke Patients Only)       Balance Overall balance assessment: Needs assistance Sitting-balance support: Feet supported;No upper extremity supported Sitting balance-Leahy Scale: Good     Standing balance support: No upper extremity supported;During functional activity Standing balance-Leahy Scale: Fair                               Pertinent Vitals/Pain Pain Assessment: No/denies pain    Home Living Family/patient expects to be discharged to:: Private residence Living Arrangements: Alone Available Help at Discharge: Friend(s);Available PRN/intermittently   Home Access: Stairs to enter Entrance Stairs-Rails: None Entrance Stairs-Number of Steps: 2 Home Layout: One level Home Equipment: Walker - 2 wheels;Cane - single point      Prior Function Level of Independence: Independent         Comments: works at [employer], mostly driving Stage managerfork lift     Hand Dominance        Extremity/Trunk Assessment   Upper Extremity Assessment Upper Extremity Assessment: Overall WFL for tasks assessed  Lower Extremity Assessment Lower Extremity Assessment: Generalized weakness       Communication   Communication: No difficulties  Cognition Arousal/Alertness: Awake/alert Behavior During Therapy: WFL for tasks assessed/performed Overall Cognitive Status: Within Functional Limits for tasks assessed                      General Comments      Exercises     Assessment/Plan    PT  Assessment Patient needs continued PT services  PT Problem List Decreased strength;Decreased activity tolerance;Decreased balance;Decreased mobility;Decreased coordination;Decreased knowledge of use of DME;Decreased safety awareness;Cardiopulmonary status limiting activity          PT Treatment Interventions DME instruction;Gait training;Stair training;Functional mobility training;Therapeutic activities;Therapeutic exercise;Balance training;Neuromuscular re-education;Patient/family education    PT Goals (Current goals can be found in the Care Plan section)  Acute Rehab PT Goals Patient Stated Goal: return home PT Goal Formulation: With patient Time For Goal Achievement: 01/22/16 Potential to Achieve Goals: Fair    Frequency Min 3X/week   Barriers to discharge        Co-evaluation               End of Session Equipment Utilized During Treatment: Gait belt Activity Tolerance: Patient tolerated treatment well Patient left: in bed;with call bell/phone within reach;with bed alarm set;Other (comment) (sitting EOB) Nurse Communication: Mobility status         Time: 1610-9604 PT Time Calculation (min) (ACUTE ONLY): 17 min   Charges:   PT Evaluation $PT Eval Moderate Complexity: 1 Procedure     PT G CodesAlessandra Bevels Jayln Madeira 01/08/2016, 10:24 AM Deborah Chalk, PT, DPT [phone]

## 2016-01-08 NOTE — Progress Notes (Signed)
Patient ID: [name], male   DOB: January 31, 1955, 61 y.o.   MRN: [id number]    SUBJECTIVE:   Underwent cath 1/5 with severe 3vCAD and elevated filling pressures. Output ok on dobutamine. Seen by Dr. PVT and not felt to be CABG candidate due to poor cardiac reserve and other co-morbidities.  Diuresing well on IV lasix and dobutamine. Co-ox 62% CVP 10-11. Breathing better. Ambulated 50 feet with PT. No CP   L/R heart cath 01/07/16: Severe CAD LM: ok LAD: long 80% calcified lesion LCX: total OM1 RCA:  Distal 80% at trifurcation RA mean 21 RV 69/24 PA 69/31, mean 45 PCWP mean 27 Cardiac Output (Fick) 5.36 (on dobutamine 2.5) Cardiac Index (Fick) 2.37   Echo (1/18): EF 20%, global hypokinesis, very mildly decreased RV systolic function.   Status post thoracentesis: Exudate by Light's criteria (total protein).  Fluid cultures negative so far.  Scheduled Meds: . aspirin EC  81 mg Oral Daily  . atorvastatin  80 mg Oral QODAY  . digoxin  0.125 mg Oral Daily  . ezetimibe  10 mg Oral Daily  . furosemide  120 mg Intravenous Q8H  . heparin  5,000 Units Subcutaneous Q8H  . insulin aspart  0-9 Units Subcutaneous TID WC  . insulin glargine  12 Units Subcutaneous QHS  . levofloxacin (LEVAQUIN) IV  750 mg Intravenous Q24H  . potassium chloride  40 mEq Oral Q6H  . [START ON 01/09/2016] potassium chloride  40 mEq Oral Daily  . sodium chloride flush  3 mL Intravenous Q12H  . spironolactone  12.5 mg Oral Daily   Continuous Infusions: . DOBUTamine 2.5 mcg/kg/min (01/07/16 0700)   PRN Meds:.sodium chloride, acetaminophen, guaiFENesin-dextromethorphan, ipratropium-albuterol, ondansetron (ZOFRAN) IV, ondansetron (ZOFRAN) IV    Vitals:   01/08/16 0828 01/08/16 0930 01/08/16 0942 01/08/16 1000  BP: 136/77   118/60  Pulse:  79    Resp: (!) 24  12   Temp: 97.8 F (36.6 C)     TempSrc: Oral     SpO2: 97%  95% 97%  Weight:      Height:        Intake/Output Summary (Last 24 hours) at  01/08/16 1124 Last data filed at 01/08/16 1052  Gross per 24 hour  Intake           1500.7 ml  Output             7900 ml  Net          -6399.3 ml    LABS: Basic Metabolic Panel:  Recent Labs  82/95/62 0344 01/08/16 0825  NA 137 133*  K 4.0 3.2*  CL 97* 89*  CO2 32 33*  GLUCOSE 149* 167*  BUN 22* 15  CREATININE 1.02 0.78  CALCIUM 8.5* 8.6*  MG 1.9 1.6*   Liver Function Tests:  Recent Labs  01/06/16 1042  AST 27  ALT 15*  ALKPHOS 108  BILITOT 1.4*  PROT 7.1  ALBUMIN 2.9*   No results for input(s): LIPASE, AMYLASE in the last 72 hours. CBC:  Recent Labs  01/07/16 0344 01/08/16 0825  WBC 4.9 5.3  NEUTROABS  --  4.1  HGB 12.5* 12.8*  HCT 39.4 39.1  MCV 88.5 85.7  PLT 104* 105*   Cardiac Enzymes:  Recent Labs  01/06/16 0004 01/06/16 0336 01/06/16 1042  TROPONINI <0.03 <0.03 <0.03   BNP: Invalid input(s): POCBNP D-Dimer: No results for input(s): DDIMER in the last 72 hours. Hemoglobin A1C: No results for input(s): HGBA1C  in the last 72 hours. Fasting Lipid Panel: No results for input(s): CHOL, HDL, LDLCALC, TRIG, CHOLHDL, LDLDIRECT in the last 72 hours. Thyroid Function Tests: No results for input(s): TSH, T4TOTAL, T3FREE, THYROIDAB in the last 72 hours.  Invalid input(s): FREET3 Anemia Panel: No results for input(s): VITAMINB12, FOLATE, FERRITIN, TIBC, IRON, RETICCTPCT in the last 72 hours.  RADIOLOGY: Dg Chest 1 View  Result Date: 01/05/2016 CLINICAL DATA:  Post right thoracentesis. EXAM: CHEST 1 VIEW COMPARISON:  01/05/2016 FINDINGS: Moderate right pleural effusion with right lower lobe atelectasis or infiltrate. No pneumothorax following thoracentesis. Mild cardiomegaly. Left AICD is unchanged. Left lung is clear. IMPRESSION: Moderate right pleural effusion with right lower lobe atelectasis or infiltrate. No pneumothorax. Electronically Signed   By: Charlett Nose M.D.   On: 01/05/2016 10:38   Dg Chest 2 View  Result Date:  01/06/2016 CLINICAL DATA:  Three weeks of coughing and chest pain. History of CHF, community-acquired pneumonia, pleural effusions, and coronary artery disease. The patient has a permanent pacemaker defibrillator. EXAM: CHEST  2 VIEW COMPARISON:  Portable chest x-ray of January 05, 2016 FINDINGS: There remains increased density in the mid and lower right lung compatible with pleural fluid layering posteriorly. There is a tiny left pleural effusion. The left lung is well-expanded. There are coarse lung markings posteriorly in the right lower lung. The interstitial markings are increased bilaterally. The cardiac silhouette is enlarged. The pulmonary vascularity is engorged. The ICD is in stable position. There is calcification in the wall of the aortic arch. IMPRESSION: There is a moderate-sized right pleural effusion and trace left pleural effusion. There is pulmonary interstitial edema secondary to CHF. There may be right basilar atelectasis or pneumonia. There has been slight interval deterioration in the appearance of the pulmonary interstitium bilaterally since yesterday's study. Thoracic aortic atherosclerosis. Electronically Signed   By: David  [place] M.D.   On: 01/06/2016 08:05   Dg Chest 2 View  Result Date: 01/05/2016 CLINICAL DATA:  Chest pain, cough. History of diabetes, cardiomyopathy/CHF. History of RIGHT thoracentesis. EXAM: CHEST  2 VIEW COMPARISON:  Chest radiograph January 07, 2015 and CT chest January 02, 2015 FINDINGS: Moderate to large RIGHT pleural effusion and underlying consolidation. Mild bronchitic changes. RIGHT heart border obscured. Mildly calcified aortic knob. Single lead LEFT cardiac pacemaker in situ. No pneumothorax. Old RIGHT lateral rib fractures. IMPRESSION: Moderate to large recurrent RIGHT pleural effusion with underlying consolidation, recommend follow up to exclude mass. Mild bronchitic changes. Electronically Signed   By: Awilda Metro M.D.   On: 01/05/2016 02:04   Ct  Chest W Contrast  Result Date: 01/05/2016 CLINICAL DATA:  Sudden onset of left-sided chest pain EXAM: CT CHEST WITH CONTRAST TECHNIQUE: Multidetector CT imaging of the chest was performed during intravenous contrast administration. CONTRAST:  75mL ISOVUE-300 IOPAMIDOL (ISOVUE-300) INJECTION 61% COMPARISON:  Chest radiograph 01/05/2016, CT chest 01/02/2015 FINDINGS: Cardiovascular: Atherosclerotic calcifications within the aorta. No aneurysm. Extensive coronary artery calcifications. Heart size upper normal. Partially visualized cardiac pacing lead. No large pericardial effusion. Mediastinum/Nodes: Stable nonspecific mediastinal lymph nodes. Trachea is midline. Thyroid gland unremarkable. Esophagus is within normal limits. Lungs/Pleura: Large right-sided pleural effusion with consolidation in the right lower lobe and partial consolidation in the right middle lobe. The left lung demonstrates scattered foci of hazy density in the lingula and left lower lobe which may reflect diffuse atelectasis or possibly mild edema. No pneumothorax. No left effusion. Upper Abdomen: Upper abdominal structures show no acute abnormalities. Ascites adjacent to the spleen and liver.  Musculoskeletal: No suspicious bone lesions. Old right-sided rib fractures IMPRESSION: 1. Large right-sided pleural effusion with consolidation in the right lower lobe. Partial consolidation in the right middle lobe, cannot rule out a pneumonia. No significant left pleural effusion. Scattered mild densities within the left upper and lower lobes could reflect scattered foci of atelectasis or mild edema. 2. Ascites in the upper abdomen 3. No change in mild mediastinal adenopathy. Electronically Signed   By: Jasmine Pang M.D.   On: 01/05/2016 03:44   US Thoracentesis Asp Pleural Space W/img Guide  Result Date: 01/05/2016 INDICATION: Congestive heart failure. History of right pleural effusion one year ago. Right chest pain. Large right pleural effusion.  Request for diagnostic and therapeutic thoracentesis. EXAM: ULTRASOUND GUIDED RIGHT THORACENTESIS MEDICATIONS: 1% Lidocaine = 10 ml. COMPLICATIONS: None immediate. PROCEDURE: An ultrasound guided thoracentesis was thoroughly discussed with the patient and questions answered. The benefits, risks, alternatives and complications were also discussed. The patient understands and wishes to proceed with the procedure. Written consent was obtained. Ultrasound was performed to localize and mark an adequate pocket of fluid in the right chest. The area was then prepped and draped in the normal sterile fashion. 1% Lidocaine was used for local anesthesia. Under ultrasound guidance a 6 Fr Safe-T-Centesis catheter was introduced. Thoracentesis was performed. The catheter was removed and a dressing applied. FINDINGS: A total of approximately 2 liters of clear yellow fluid was removed. Samples were sent to the laboratory as requested by the clinical team. IMPRESSION: Successful ultrasound guided right thoracentesis yielding 2 liters of pleural fluid. Post procedure ultrasound revealed remaining moderate effusion. Recommend repeat thoracentesis tomorrow. Read by:  Corrin Parker, PA-C Electronically Signed   By: Judie Petit.  Shick M.D.   On: 01/05/2016 10:23    PHYSICAL EXAM General: Unkempt. Lying in bed NAD  Neck: JVP to jaw, no thyromegaly or thyroid nodule.  Lungs: coarse bilaterally CV: Nondisplaced PMI.  Heart regular S1/S2, no S3/S4, 1/6 SEM RUSB.  No peripheral edema.  No carotid bruit.  Normal pedal pulses.  Abdomen: Soft, nontender, no hepatosplenomegaly, mild distention.  Neurologic: Alert and oriented x 3.  Psych: Normal affect. Extremities: No clubbing or cyanosis. 1+ edema to thighs with severe chronic venous stasis changes lower legs.   TELEMETRY: Reviewed telemetry pt in NSR  ASSESSMENT AND PLAN: 61 yo with history of PAD (s/p fem-pop), HTN, DM, CAD, ischemic cardiomyopathy/chronic systolic CHF presented with  gradual worsening of volume overload and dyspnea.  1. Acute on chronic systolic CHF: Echo 1/18 with EF 20% and very mildly decreased RV systolic function.  Ischemic cardiomyopathy, has [employer] ICD.   - Cath results 1/6 as above with severe CAD and elevated filling pressures - Resonding well to IV diuresis and dobutamine. Still volume overloaded. Continue current regimen. - Increase spiro to 25. Continue digoxin - Off Entresto with low BP and Coreg with concern for low output/dobutamine use.  -Turned down for CABG by Dr. PVT given comorbidities and poor functional status. Suspect he is not canddiate for advanced therapies as well.  2. PNA: Right-sided PNA noted on chest CT. Pending sputum culture.  He is on levofloxacin per primary service.  Afebrile.  3. Right pleural effusion: s/p thoracentesis Exudative by Light's criteria, parapneumonic.  4. CAD: .  - See discussion above regarding LHC/RHC. Turned down for CABG.  - Continue ASA, statin, Zetia.  - No lesions that are optimal for PCI. Would treat medically for now. Can review with interventional team on Monday 5. Hypokalemia.  -  will supp.   Arvilla MeresBensimhon, Murrell Dome MD 01/08/2016 11:24 AM

## 2016-01-08 NOTE — Progress Notes (Signed)
Pt with 4 beats VT. Thedore MinsSingh, MD made aware. Verbal order for CBC, BMP, and Mag. Will draw and continue to monitor closely.  Glade LloydMelissa Snow, RN

## 2016-01-08 NOTE — Progress Notes (Signed)
   Multiple tortuous varicosities and branching noted throughout the bilateral lower extremities.     Right Lower Extremity Vein Map    Right Great Saphenous Vein   Segment Diameter Comment  1. Origin 4mm Tortuous, multiple branches  2. High Thigh 1.498mm   3. Mid Thigh 2.181mm   4. Low Thigh 2.463mm Tortuous  5. At Knee 3.152mm   6. High Calf 2mm Multiple branches  7. Low Calf 1.837mm Tortuous  8. Ankle 1.256mm Multiple branches   mm    mm    mm      Left Lower Extremity Vein Map    Left Great Saphenous Vein   Segment Diameter Comment  1. Origin 6.694mm Branch  2. High Thigh 2.561mm Branch  3. Mid Thigh 2.204mm   4. Low Thigh 2.594mm   5. At Knee 2.446mm   6. High Calf 2.731mm Multiple branches  7. Low Calf 1.396mm Tortuous, multiple branches  8. Ankle 1.86mm    mm    mm    mm

## 2016-01-08 NOTE — Progress Notes (Signed)
OT Cancellation Note and Discharge  Patient Details Name: Dalton RainwaterLarry D [name] MRN: [id number] DOB: Aug 26, 1955   Cancelled Treatment:    Reason Eval/Treat Not Completed: Other (comment). Pt reports I do struggle at times with basic ADLs but I manage to do them and I have been through therapy before I don't think there is anything new you have to teach me. Noted his O2 sat drop with PT earlier and so asked if he had a seat to sit on in his tub/shower to help him be safer and conserve energy and he said no and that he did not want one. Pt dpes not feel that he needs OT services, we will sign off.  Evette GeorgesLeonard, Jessy Calixte Eva [phone] 01/08/2016, 12:09 PM

## 2016-01-09 DIAGNOSIS — I2510 Atherosclerotic heart disease of native coronary artery without angina pectoris: Secondary | ICD-10-CM

## 2016-01-09 DIAGNOSIS — J90 Pleural effusion, not elsewhere classified: Secondary | ICD-10-CM

## 2016-01-09 DIAGNOSIS — J189 Pneumonia, unspecified organism: Secondary | ICD-10-CM

## 2016-01-09 LAB — CBC
HCT: 39.8 % (ref 39.0–52.0)
HEMOGLOBIN: 12.7 g/dL — AB (ref 13.0–17.0)
MCH: 27.3 pg (ref 26.0–34.0)
MCHC: 31.9 g/dL (ref 30.0–36.0)
MCV: 85.6 fL (ref 78.0–100.0)
Platelets: 113 10*3/uL — ABNORMAL LOW (ref 150–400)
RBC: 4.65 MIL/uL (ref 4.22–5.81)
RDW: 18.7 % — ABNORMAL HIGH (ref 11.5–15.5)
WBC: 4.9 10*3/uL (ref 4.0–10.5)

## 2016-01-09 LAB — BASIC METABOLIC PANEL
ANION GAP: 8 (ref 5–15)
BUN: 15 mg/dL (ref 6–20)
CALCIUM: 8.4 mg/dL — AB (ref 8.9–10.3)
CO2: 35 mmol/L — AB (ref 22–32)
Chloride: 90 mmol/L — ABNORMAL LOW (ref 101–111)
Creatinine, Ser: 0.88 mg/dL (ref 0.61–1.24)
Glucose, Bld: 175 mg/dL — ABNORMAL HIGH (ref 65–99)
Potassium: 3.8 mmol/L (ref 3.5–5.1)
Sodium: 133 mmol/L — ABNORMAL LOW (ref 135–145)

## 2016-01-09 LAB — CHOLESTEROL, BODY FLUID: Cholesterol, Fluid: 58 mg/dL

## 2016-01-09 LAB — COOXEMETRY PANEL
Carboxyhemoglobin: 1.7 % — ABNORMAL HIGH (ref 0.5–1.5)
Methemoglobin: 1 % (ref 0.0–1.5)
O2 SAT: 66.2 %
TOTAL HEMOGLOBIN: 13 g/dL (ref 12.0–16.0)

## 2016-01-09 LAB — GLUCOSE, CAPILLARY
GLUCOSE-CAPILLARY: 149 mg/dL — AB (ref 65–99)
GLUCOSE-CAPILLARY: 187 mg/dL — AB (ref 65–99)
Glucose-Capillary: 211 mg/dL — ABNORMAL HIGH (ref 65–99)
Glucose-Capillary: 213 mg/dL — ABNORMAL HIGH (ref 65–99)

## 2016-01-09 LAB — MAGNESIUM: Magnesium: 1.9 mg/dL (ref 1.7–2.4)

## 2016-01-09 MED ORDER — MAGNESIUM SULFATE 2 GM/50ML IV SOLN
2.0000 g | Freq: Once | INTRAVENOUS | Status: AC
  Administered 2016-01-09: 2 g via INTRAVENOUS
  Filled 2016-01-09: qty 50

## 2016-01-09 MED ORDER — DOBUTAMINE IN D5W 4-5 MG/ML-% IV SOLN: 1.2500 ug/kg/min | INTRAVENOUS | Status: DC

## 2016-01-09 MED ORDER — POTASSIUM CHLORIDE CRYS ER 20 MEQ PO TBCR
40.0000 meq | EXTENDED_RELEASE_TABLET | Freq: Once | ORAL | Status: AC
  Administered 2016-01-09: 40 meq via ORAL
  Filled 2016-01-09: qty 2

## 2016-01-09 NOTE — Progress Notes (Addendum)
Patient ID: Dalton RainwaterLarry D [name], male   DOB: 1955/08/06, 61 y.o.   MRN: [id number]    SUBJECTIVE:   Underwent cath 1/5 with severe 3vCAD and elevated filling pressures. Output ok on dobutamine. Seen by Dr. PVT and not felt to be CABG candidate due to poor cardiac reserve and other co-morbidities.  Continues to diurese well on IV lasix and dobutamine. Weight apparently down 25 pounds in 2 days.  Co-ox 66% CVP 6-7. Breathing better. Ambulating short distances with PT. No CP. Renal function stable. K 3.8 Mg 1.9. Only complaint is cough. Very sedentary.    L/R heart cath 01/07/16: Severe CAD LM: ok LAD: long 80% calcified lesion LCX: total OM1 RCA:  Distal 80% at trifurcation RA mean 21 RV 69/24 PA 69/31, mean 45 PCWP mean 27 Cardiac Output (Fick) 5.36 (on dobutamine 2.5) Cardiac Index (Fick) 2.37   Echo (1/18): EF 20%, global hypokinesis, very mildly decreased RV systolic function.   Status post thoracentesis: Exudate by Light's criteria (total protein).  Fluid cultures negative so far.  Scheduled Meds: . aspirin EC  81 mg Oral Daily  . atorvastatin  80 mg Oral QODAY  . digoxin  0.125 mg Oral Daily  . ezetimibe  10 mg Oral Daily  . furosemide  120 mg Intravenous Q8H  . heparin  5,000 Units Subcutaneous Q8H  . insulin aspart  0-9 Units Subcutaneous TID WC  . insulin glargine  12 Units Subcutaneous QHS  . levofloxacin (LEVAQUIN) IV  750 mg Intravenous Q24H  . potassium chloride  40 mEq Oral Daily  . sodium chloride flush  3 mL Intravenous Q12H  . spironolactone  25 mg Oral Daily   Continuous Infusions: . DOBUTamine 2.5 mcg/kg/min (01/09/16 0701)   PRN Meds:.sodium chloride, acetaminophen, guaiFENesin-dextromethorphan, ipratropium-albuterol, ondansetron (ZOFRAN) IV    Vitals:   01/09/16 0700 01/09/16 0800 01/09/16 0900 01/09/16 1000  BP: 137/69 139/78 (!) 137/58 (!) 112/49  Pulse: 84     Resp: 18 18 15  (!) 36  Temp: 98.2 F (36.8 C)     TempSrc: Oral     SpO2: (!) 86% 96% 94%  100%  Weight:      Height:        Intake/Output Summary (Last 24 hours) at 01/09/16 1121 Last data filed at 01/09/16 1000  Gross per 24 hour  Intake           1416.1 ml  Output             6075 ml  Net          -4658.9 ml    LABS: Basic Metabolic Panel:  Recent Labs  62/13/799/06/20 0825 01/09/16 0434  NA 133* 133*  K 3.2* 3.8  CL 89* 90*  CO2 33* 35*  GLUCOSE 167* 175*  BUN 15 15  CREATININE 0.78 0.88  CALCIUM 8.6* 8.4*  MG 1.6* 1.9   Liver Function Tests: No results for input(s): AST, ALT, ALKPHOS, BILITOT, PROT, ALBUMIN in the last 72 hours. No results for input(s): LIPASE, AMYLASE in the last 72 hours. CBC:  Recent Labs  01/08/16 0825 01/09/16 0434  WBC 5.3 4.9  NEUTROABS 4.1  --   HGB 12.8* 12.7*  HCT 39.1 39.8  MCV 85.7 85.6  PLT 105* 113*   Cardiac Enzymes: No results for input(s): CKTOTAL, CKMB, CKMBINDEX, TROPONINI in the last 72 hours. BNP: Invalid input(s): POCBNP D-Dimer: No results for input(s): DDIMER in the last 72 hours. Hemoglobin A1C: No results for input(s): HGBA1C in the  last 72 hours. Fasting Lipid Panel: No results for input(s): CHOL, HDL, LDLCALC, TRIG, CHOLHDL, LDLDIRECT in the last 72 hours. Thyroid Function Tests: No results for input(s): TSH, T4TOTAL, T3FREE, THYROIDAB in the last 72 hours.  Invalid input(s): FREET3 Anemia Panel: No results for input(s): VITAMINB12, FOLATE, FERRITIN, TIBC, IRON, RETICCTPCT in the last 72 hours.  RADIOLOGY: Dg Chest 1 View  Result Date: 01/05/2016 CLINICAL DATA:  Post right thoracentesis. EXAM: CHEST 1 VIEW COMPARISON:  01/05/2016 FINDINGS: Moderate right pleural effusion with right lower lobe atelectasis or infiltrate. No pneumothorax following thoracentesis. Mild cardiomegaly. Left AICD is unchanged. Left lung is clear. IMPRESSION: Moderate right pleural effusion with right lower lobe atelectasis or infiltrate. No pneumothorax. Electronically Signed   By: [name] M.D.   On: 01/05/2016 10:38    Dg Chest 2 View  Result Date: 01/06/2016 CLINICAL DATA:  Three weeks of coughing and chest pain. History of CHF, community-acquired pneumonia, pleural effusions, and coronary artery disease. The patient has a permanent pacemaker defibrillator. EXAM: CHEST  2 VIEW COMPARISON:  Portable chest x-ray of January 05, 2016 FINDINGS: There remains increased density in the mid and lower right lung compatible with pleural fluid layering posteriorly. There is a tiny left pleural effusion. The left lung is well-expanded. There are coarse lung markings posteriorly in the right lower lung. The interstitial markings are increased bilaterally. The cardiac silhouette is enlarged. The pulmonary vascularity is engorged. The ICD is in stable position. There is calcification in the wall of the aortic arch. IMPRESSION: There is a moderate-sized right pleural effusion and trace left pleural effusion. There is pulmonary interstitial edema secondary to CHF. There may be right basilar atelectasis or pneumonia. There has been slight interval deterioration in the appearance of the pulmonary interstitium bilaterally since yesterday's study. Thoracic aortic atherosclerosis. Electronically Signed   By: [name] M.D.   On: 01/06/2016 08:05   Dg Chest 2 View  Result Date: 01/05/2016 CLINICAL DATA:  Chest pain, cough. History of diabetes, cardiomyopathy/CHF. History of RIGHT thoracentesis. EXAM: CHEST  2 VIEW COMPARISON:  Chest radiograph January 07, 2015 and CT chest January 02, 2015 FINDINGS: Moderate to large RIGHT pleural effusion and underlying consolidation. Mild bronchitic changes. RIGHT heart border obscured. Mildly calcified aortic knob. Single lead LEFT cardiac pacemaker in situ. No pneumothorax. Old RIGHT lateral rib fractures. IMPRESSION: Moderate to large recurrent RIGHT pleural effusion with underlying consolidation, recommend follow up to exclude mass. Mild bronchitic changes. Electronically Signed   By: [name]  M.D.   On: 01/05/2016 02:04   Ct Chest W Contrast  Result Date: 01/05/2016 CLINICAL DATA:  Sudden onset of left-sided chest pain EXAM: CT CHEST WITH CONTRAST TECHNIQUE: Multidetector CT imaging of the chest was performed during intravenous contrast administration. CONTRAST:  75mL ISOVUE-300 IOPAMIDOL (ISOVUE-300) INJECTION 61% COMPARISON:  Chest radiograph 01/05/2016, CT chest 01/02/2015 FINDINGS: Cardiovascular: Atherosclerotic calcifications within the aorta. No aneurysm. Extensive coronary artery calcifications. Heart size upper normal. Partially visualized cardiac pacing lead. No large pericardial effusion. Mediastinum/Nodes: Stable nonspecific mediastinal lymph nodes. Trachea is midline. Thyroid gland unremarkable. Esophagus is within normal limits. Lungs/Pleura: Large right-sided pleural effusion with consolidation in the right lower lobe and partial consolidation in the right middle lobe. The left lung demonstrates scattered foci of hazy density in the lingula and left lower lobe which may reflect diffuse atelectasis or possibly mild edema. No pneumothorax. No left effusion. Upper Abdomen: Upper abdominal structures show no acute abnormalities. Ascites adjacent to the spleen and liver. Musculoskeletal: No  suspicious bone lesions. Old right-sided rib fractures IMPRESSION: 1. Large right-sided pleural effusion with consolidation in the right lower lobe. Partial consolidation in the right middle lobe, cannot rule out a pneumonia. No significant left pleural effusion. Scattered mild densities within the left upper and lower lobes could reflect scattered foci of atelectasis or mild edema. 2. Ascites in the upper abdomen 3. No change in mild mediastinal adenopathy. Electronically Signed   By: [name] M.D.   On: 01/05/2016 03:44   US Thoracentesis Asp Pleural Space W/img Guide  Result Date: 01/05/2016 INDICATION: Congestive heart failure. History of right pleural effusion one year ago. Right chest pain.  Large right pleural effusion. Request for diagnostic and therapeutic thoracentesis. EXAM: ULTRASOUND GUIDED RIGHT THORACENTESIS MEDICATIONS: 1% Lidocaine = 10 ml. COMPLICATIONS: None immediate. PROCEDURE: An ultrasound guided thoracentesis was thoroughly discussed with the patient and questions answered. The benefits, risks, alternatives and complications were also discussed. The patient understands and wishes to proceed with the procedure. Written consent was obtained. Ultrasound was performed to localize and mark an adequate pocket of fluid in the right chest. The area was then prepped and draped in the normal sterile fashion. 1% Lidocaine was used for local anesthesia. Under ultrasound guidance a 6 Fr Safe-T-Centesis catheter was introduced. Thoracentesis was performed. The catheter was removed and a dressing applied. FINDINGS: A total of approximately 2 liters of clear yellow fluid was removed. Samples were sent to the laboratory as requested by the clinical team. IMPRESSION: Successful ultrasound guided right thoracentesis yielding 2 liters of pleural fluid. Post procedure ultrasound revealed remaining moderate effusion. Recommend repeat thoracentesis tomorrow. Read by:  Dalton Parker, [name] Electronically Signed   By: [name] M.D.   On: 01/05/2016 10:23    PHYSICAL EXAM General: Unkempt. Lying in bed NAD  Neck: JVP 6, no thyromegaly or thyroid nodule.  Lungs: coarse bilaterally CV: Nondisplaced PMI.  Heart regular S1/S2, no S3/S4, 1/6 SEM RUSB.  No peripheral edema.  No carotid bruit.  Normal pedal pulses.  Abdomen: Soft, nontender, no hepatosplenomegaly, mild distention.  Neurologic: Alert and oriented x 3.  Psych: Flat affect. Somewhat oppostional Extremities: No clubbing or cyanosis. Trace edema to thighs with severe chronic venous stasis changes lower legs.   TELEMETRY: Reviewed telemetry pt in NSR  ASSESSMENT AND PLAN: 61 yo with history of PAD (s/p fem-pop), HTN, DM, CAD, ischemic  cardiomyopathy/chronic systolic CHF presented with gradual worsening of volume overload and dyspnea.  1. Acute on chronic systolic CHF: Echo 1/18 with EF 20% and very mildly decreased RV systolic function.  Ischemic cardiomyopathy, has [employer] ICD.   - Cath results 1/6 as above with severe CAD and elevated filling pressures - Resonding well to IV diuresis and dobutamine. Now euvolemic after 25 pound diuresis. Co-ox looks good.  - Switch to po lasix. Cut dobutamine to 1.25. Not good candidate for home inotropes.  - Continue digoxin and spiro - Off Entresto with low BP and Coreg with concern for low output/dobutamine use.  -Turned down for CABG by Dr. PVT given comorbidities and poor functional status. Suspect he is not canddiate for advanced therapies as well.  2. PNA: Right-sided PNA noted on chest CT. Pending sputum culture.  He is on levofloxacin per primary service.  Afebrile.  - Still with cough. Give IS. Repeat CXR in am 3. Right pleural effusion: s/p thoracentesis Exudative by Light's criteria, parapneumonic.  4. CAD: .  - See discussion above regarding LHC/RHC. Turned down for CABG.  - Continue ASA,  statin, Zetia.  - No lesions that are optimal for PCI. Given that CAD not critical and insight into his heart disease is poor, would treat medically for now and try to optimize functional status over next 1-2 months and reconsider CABG. Can review with interventional team on Monday.  5. Hypokalemia/hypomagnesemia.  - will supp.   [name] 01/09/2016 11:21 AM  Addendum: I spoke with Dr. Donata [name] at bedside and he said due to lack of conduits patient is not candidate for CABG even with rehab.  Bensimhon, Daniel,[name] 12:44 PM

## 2016-01-09 NOTE — Progress Notes (Signed)
2 Days Post-Op Procedure(s) (LRB): Right/Left Heart Cath and Coronary Angiography (N/A) Subjective: Ischemic cardiomyopathy Bilateral venous stasis dermatitis of legs S/p bilateral femoral-popliteal bypass Vein mapping reviewed- no useable saphenous conduit in the legs Patient not a candidate for high risk CABG  Objective: Vital signs in last 24 hours: Temp:  [97.3 F (36.3 C)-98.3 F (36.8 C)] 97.6 F (36.4 C) (01/07 1100) Pulse Rate:  [83-86] 86 (01/07 1100) Cardiac Rhythm: Normal sinus rhythm (01/07 0800) Resp:  [12-36] 12 (01/07 1100) BP: (106-139)/(47-84) 125/59 (01/07 1100) SpO2:  [86 %-100 %] 98 % (01/07 1100) Weight:  [217 lb 4.8 oz (98.6 kg)] 217 lb 4.8 oz (98.6 kg) (01/07 0500)  Hemodynamic parameters for last 24 hours: CVP:  [5 mmHg-12 mmHg] 5 mmHg  Intake/Output from previous day: 01/06 0701 - 01/07 0700 In: 1752.5 [P.O.:1300; I.V.:102.5; IV Piggyback:350] Out: 7050 [Urine:7050] Intake/Output this shift: Total I/O In: 360 [P.O.:360] Out: 2350 [Urine:2350]  Paced rhythm Mild edema Lab Results:  Recent Labs  01/08/16 0825 01/09/16 0434  WBC 5.3 4.9  HGB 12.8* 12.7*  HCT 39.1 39.8  PLT 105* 113*   BMET:  Recent Labs  01/08/16 0825 01/09/16 0434  NA 133* 133*  K 3.2* 3.8  CL 89* 90*  CO2 33* 35*  GLUCOSE 167* 175*  BUN 15 15  CREATININE 0.78 0.88  CALCIUM 8.6* 8.4*    PT/INR:  Recent Labs  01/07/16 0344  LABPROT 16.4*  INR 1.32   ABG    Component Value Date/Time   PHART 7.453 (H) 09/27/2012 1210   HCO3 28.4 (H) 09/27/2012 1210   TCO2 30 03/13/2015 0033   O2SAT 66.2 01/09/2016 0440   CBG (last 3)   Recent Labs  01/08/16 2121 01/09/16 0759 01/09/16 1138  GLUCAP 186* 149* 211*    Assessment/Plan: S/P Procedure(s) (LRB): Right/Left Heart Cath and Coronary Angiography (N/A) Patient not a candidate for CABG- patient understands   LOS: 4 days    [name] 01/09/2016

## 2016-01-09 NOTE — Progress Notes (Signed)
PROGRESS NOTE        PATIENT DETAILS Name: [name] Age: 61 y.o. Sex: male Date of Birth: 1955/07/30 Admit Date: 01/05/2016 Admitting Physician Eduard Clos, MD ZOX:[id number] Larita Fife, MD  Brief Narrative:  Patient is a 61 y.o. male with hx of ischemic cardiomyopathy (last EF 20-25 % on 01/03/15) presented with worsening SOB and left sided chest pain. Found to have acute on chronic systolic CHF and moderate pleural effusion-underwent thoracontesis on 1/4. See below for details.   Subjective:  Sitting up in bed, no headache, no chest or abdominal pain. Today not short of breath.  Assessment/Plan:  Acute Hypoxemic Resp Failure: multifactorial etiology-due to acute on chronic systolic CHF, CAP, pleural effusion. Continue diuresis, IV Abx and supportive care. Taper off O2 as tolerated, clinically better.  Acute on Chronic Systolic CHF (last EF 20-25 % on 01/03/15) due to ischemic cardiomyopathy:Still decompensated, etiology following on IV Lasix and dobutamine drip, oral digoxin, blood pressure too low for Coreg, ACE/ARB will defer management to cardiology. Seen by cardiac thoracic surgery for now Medical management only.  Right Pleural Effusion: Exudative by light's criteria, probably parapneumonic effusion. Pleural fluid cultures negative so far. Even though this is a exudative pleural effusion, given ascites-and significant volume overload-suspect some of it is due to CHF. Pleural fluid cytology negative. So far 13 L negative.  CAP:CT chest confirms consolidation in the right middle lobe-continue Levaquin for a total of 5 days. Influenza PCR negative, blood cultures negative so far. Follow  Hx of CAD (MI 2005 LAD stent W/ BMS CFX, DES x 2 RCA): continue ASA and statin for secondary prevention, no Coreg for now per cardiology as per pressure was low, chest pain-free, severe three-vessel disease per left heart cath however per cardiac thoracic surgery which.  Defer medical management to cardiology.  Hx of PVD-s/p L fem-pop 11/2014:continue ASA/Statin  Ischemic cardiomyopathy-s/p [employer] ICD  Dyslipidemia:Continue Lipitor and Zetia  Tobacco Abuse: counseled  Hypokalemia and hypomagnesemia. Replaced will monitor.   DM-2:CBG's with Lantus 12 units, and SSI- follow  CBG (last 3)   Recent Labs  01/08/16 1606 01/08/16 2121 01/09/16 0759  GLUCAP 106* 186* 149*     DVT Prophylaxis: Prophylactic Lovenox   Code Status: Full code   Family Communication: None at bedside  Disposition Plan: Remain inpatient- will require several more days of hospitalization.  Antimicrobial agents: Anti-infectives    Start     Dose/Rate Route Frequency Ordered Stop   01/07/16 0600  levofloxacin (LEVAQUIN) IVPB 750 mg     750 mg 100 mL/hr over 90 Minutes Intravenous Every 24 hours 01/06/16 0826 01/12/16 0559   01/06/16 0600  levofloxacin (LEVAQUIN) IVPB 500 mg  Status:  Discontinued     500 mg 100 mL/hr over 60 Minutes Intravenous Every 24 hours 01/05/16 0811 01/06/16 0826   01/05/16 0300  levofloxacin (LEVAQUIN) IVPB 500 mg     500 mg 100 mL/hr over 60 Minutes Intravenous  Once 01/05/16 0249 01/05/16 0604      Procedures:  TEE - Compared to a prior study in 01/2015, the LVEF is unchanged at around 20% with severe global hypokinesis.  Thoracocentesis by IR 1/4>> borderline exudate  LHC/RHC 1/5>> 1. Elevated left and right heart filling pressures with high CVP/PCWP ratio.   2. Pulmonary venous hypertension.  3. Cardiac output acceptable on 2.5 dobutamine.  4.  Extensive 3 vessel CAD with suspected hemodynamically significant stenoses in the proximal LAD, distal RCA, and OM1.    CONSULTS:  cardiology  Time spent: 25- minutes-Greater than 50% of this time was spent in counseling, explanation of diagnosis, planning of further management, and coordination of care.  MEDICATIONS: Scheduled Meds: . aspirin EC  81 mg Oral Daily  .  atorvastatin  80 mg Oral QODAY  . digoxin  0.125 mg Oral Daily  . ezetimibe  10 mg Oral Daily  . furosemide  120 mg Intravenous Q8H  . heparin  5,000 Units Subcutaneous Q8H  . insulin aspart  0-9 Units Subcutaneous TID WC  . insulin glargine  12 Units Subcutaneous QHS  . levofloxacin (LEVAQUIN) IV  750 mg Intravenous Q24H  . potassium chloride  40 mEq Oral Daily  . sodium chloride flush  3 mL Intravenous Q12H  . spironolactone  25 mg Oral Daily   Continuous Infusions: . DOBUTamine 2.5 mcg/kg/min (01/09/16 0701)   PRN Meds:.sodium chloride, acetaminophen, guaiFENesin-dextromethorphan, ipratropium-albuterol, ondansetron (ZOFRAN) IV   PHYSICAL EXAM: Vital signs: Vitals:   01/09/16 0600 01/09/16 0700 01/09/16 0800 01/09/16 0900  BP: 117/84 137/69 139/78 (!) 137/58  Pulse:  84    Resp:  18 18 15   Temp:  98.2 F (36.8 C)    TempSrc:  Oral    SpO2: 95% (!) 86% 96% 94%  Weight:      Height:       Filed Weights   01/07/16 0500 01/08/16 0354 01/09/16 0500  Weight: 110.2 kg (242 lb 15.2 oz) 103.8 kg (228 lb 14.4 oz) 98.6 kg (217 lb 4.8 oz)   Body mass index is 29.47 kg/m.   General appearance :Awake, alert, not in any distress.  Eyes:, pupils equally reactive to light and accomodation,no scleral icterus. HEENT: Atraumatic and Normocephalic Neck: supple, no JVD. No cervical lymphadenopathy. No thyromegaly Resp:Decreased air entry at right base-but otherwise clear CVS: S1 S2 regular, GI: Bowel sounds present, Non tender and not distended but obese with no gaurding, rigidity or rebound.No organomegaly Extremities: B/L Lower Ext shows + edema in his thighs- both legs are warm to touch Neurology:  speech clear,Non focal, sensation is grossly intact. Psychiatric: Normal judgment and insight. Alert and oriented x 3. Normal mood. Musculoskeletal:No digital cyanosis Skin:chronic venous stasis changes with scaling in bilateral lower ext  I have personally reviewed following labs and  imaging studies  LABORATORY DATA: CBC:  Recent Labs Lab 01/05/16 0110 01/07/16 0344 01/08/16 0825 01/09/16 0434  WBC 6.3 4.9 5.3 4.9  NEUTROABS  --   --  4.1  --   HGB 12.1* 12.5* 12.8* 12.7*  HCT 36.9* 39.4 39.1 39.8  MCV 84.8 88.5 85.7 85.6  PLT 121* 104* 105* 113*    Basic Metabolic Panel:  Recent Labs Lab 01/05/16 0110 01/06/16 1042 01/07/16 0344 01/08/16 0825 01/09/16 0434  NA 136 134* 137 133* 133*  K 3.0* 3.4* 4.0 3.2* 3.8  CL 97* 96* 97* 89* 90*  CO2 28 26 32 33* 35*  GLUCOSE 72 139* 149* 167* 175*  BUN 16 25* 22* 15 15  CREATININE 1.05 1.32* 1.02 0.78 0.88  CALCIUM 8.4* 8.5* 8.5* 8.6* 8.4*  MG  --   --  1.9 1.6* 1.9    GFR: Estimated Creatinine Clearance: 107.2 mL/min (by C-G formula based on SCr of 0.88 mg/dL).  Liver Function Tests:  Recent Labs Lab 01/06/16 1042  AST 27  ALT 15*  ALKPHOS 108  BILITOT 1.4*  PROT  7.1  ALBUMIN 2.9*   No results for input(s): LIPASE, AMYLASE in the last 168 hours. No results for input(s): AMMONIA in the last 168 hours.  Coagulation Profile:  Recent Labs Lab 01/07/16 0344  INR 1.32    Cardiac Enzymes:  Recent Labs Lab 01/05/16 1001 01/05/16 1913 01/06/16 0004 01/06/16 0336 01/06/16 1042  TROPONINI <0.03 <0.03 <0.03 <0.03 <0.03    BNP (last 3 results) No results for input(s): PROBNP in the last 8760 hours.  HbA1C: No results for input(s): HGBA1C in the last 72 hours.  CBG:  Recent Labs Lab 01/08/16 0826 01/08/16 1206 01/08/16 1606 01/08/16 2121 01/09/16 0759  GLUCAP 174* 225* 106* 186* 149*    Lipid Profile: No results for input(s): CHOL, HDL, LDLCALC, TRIG, CHOLHDL, LDLDIRECT in the last 72 hours.  Thyroid Function Tests: No results for input(s): TSH, T4TOTAL, FREET4, T3FREE, THYROIDAB in the last 72 hours.  Anemia Panel: No results for input(s): VITAMINB12, FOLATE, FERRITIN, TIBC, IRON, RETICCTPCT in the last 72 hours.  Urine analysis:    Component Value Date/Time    COLORURINE AMBER (A) 01/05/2016 2353   APPEARANCEUR HAZY (A) 01/05/2016 2353   LABSPEC 1.028 01/05/2016 2353   PHURINE 5.0 01/05/2016 2353   GLUCOSEU NEGATIVE 01/05/2016 2353   HGBUR SMALL (A) 01/05/2016 2353   BILIRUBINUR NEGATIVE 01/05/2016 2353   KETONESUR NEGATIVE 01/05/2016 2353   PROTEINUR NEGATIVE 01/05/2016 2353   UROBILINOGEN 1.0 11/06/2014 0906   NITRITE NEGATIVE 01/05/2016 2353   LEUKOCYTESUR MODERATE (A) 01/05/2016 2353    Sepsis Labs: Lactic Acid, Venous    Component Value Date/Time   LATICACIDVEN 1.23 01/05/2016 0413    MICROBIOLOGY: Recent Results (from the past 240 hour(s))  Culture, blood (routine x 2) Call MD if unable to obtain prior to antibiotics being given     Status: None (Preliminary result)   Collection Time: 01/05/16  1:25 AM  Result Value Ref Range Status   Specimen Description BLOOD RIGHT ANTECUBITAL  Final   Special Requests BOTTLES DRAWN AEROBIC AND ANAEROBIC 5CC  Final   Culture NO GROWTH 3 DAYS  Final   Report Status PENDING  Incomplete  Culture, blood (routine x 2) Call MD if unable to obtain prior to antibiotics being given     Status: None (Preliminary result)   Collection Time: 01/05/16  8:29 AM  Result Value Ref Range Status   Specimen Description BLOOD RIGHT ANTECUBITAL  Final   Special Requests BOTTLES DRAWN AEROBIC AND ANAEROBIC  5CC  Final   Culture NO GROWTH 3 DAYS  Final   Report Status PENDING  Incomplete  Culture, body fluid-bottle     Status: None (Preliminary result)   Collection Time: 01/05/16 10:24 AM  Result Value Ref Range Status   Specimen Description FLUID PERITONEAL  Final   Special Requests NONE  Final   Culture NO GROWTH 3 DAYS  Final   Report Status PENDING  Incomplete  Gram stain     Status: None   Collection Time: 01/05/16 10:24 AM  Result Value Ref Range Status   Specimen Description FLUID PERITONEAL  Final   Special Requests NONE  Final   Gram Stain   Final    RARE WBC PRESENT, PREDOMINANTLY  MONONUCLEAR NO ORGANISMS SEEN    Report Status 01/05/2016 FINAL  Final  Culture, sputum-assessment     Status: None   Collection Time: 01/05/16  9:09 PM  Result Value Ref Range Status   Specimen Description SPUTUM  Final   Special Requests NONE  Final   Sputum evaluation THIS SPECIMEN IS ACCEPTABLE FOR SPUTUM CULTURE  Final   Report Status 01/06/2016 FINAL  Final  Culture, respiratory (NON-Expectorated)     Status: None   Collection Time: 01/05/16  9:09 PM  Result Value Ref Range Status   Specimen Description SPUTUM  Final   Special Requests NONE Reflexed from [id number]  Final   Gram Stain   Final    ABUNDANT WBC PRESENT, PREDOMINANTLY PMN ABUNDANT GRAM POSITIVE COCCI IN CLUSTERS IN PAIRS FEW GRAM NEGATIVE RODS FEW GRAM POSITIVE RODS    Culture Consistent with normal respiratory flora.  Final   Report Status 01/08/2016 FINAL  Final  MRSA PCR Screening     Status: None   Collection Time: 01/06/16  8:21 PM  Result Value Ref Range Status   MRSA by PCR NEGATIVE NEGATIVE Final    Comment:        The GeneXpert MRSA Assay (FDA approved for NASAL specimens only), is one component of a comprehensive MRSA colonization surveillance program. It is not intended to diagnose MRSA infection nor to guide or monitor treatment for MRSA infections.     RADIOLOGY STUDIES/RESULTS: Dg Chest 1 View  Result Date: 01/05/2016 CLINICAL DATA:  Post right thoracentesis. EXAM: CHEST 1 VIEW COMPARISON:  01/05/2016 FINDINGS: Moderate right pleural effusion with right lower lobe atelectasis or infiltrate. No pneumothorax following thoracentesis. Mild cardiomegaly. Left AICD is unchanged. Left lung is clear. IMPRESSION: Moderate right pleural effusion with right lower lobe atelectasis or infiltrate. No pneumothorax. Electronically Signed   By: Charlett Nose M.D.   On: 01/05/2016 10:38   Dg Chest 2 View  Result Date: 01/06/2016 CLINICAL DATA:  Three weeks of coughing and chest pain. History of CHF,  community-acquired pneumonia, pleural effusions, and coronary artery disease. The patient has a permanent pacemaker defibrillator. EXAM: CHEST  2 VIEW COMPARISON:  Portable chest x-ray of January 05, 2016 FINDINGS: There remains increased density in the mid and lower right lung compatible with pleural fluid layering posteriorly. There is a tiny left pleural effusion. The left lung is well-expanded. There are coarse lung markings posteriorly in the right lower lung. The interstitial markings are increased bilaterally. The cardiac silhouette is enlarged. The pulmonary vascularity is engorged. The ICD is in stable position. There is calcification in the wall of the aortic arch. IMPRESSION: There is a moderate-sized right pleural effusion and trace left pleural effusion. There is pulmonary interstitial edema secondary to CHF. There may be right basilar atelectasis or pneumonia. There has been slight interval deterioration in the appearance of the pulmonary interstitium bilaterally since yesterday's study. Thoracic aortic atherosclerosis. Electronically Signed   By: David  [place] M.D.   On: 01/06/2016 08:05   Dg Chest 2 View  Result Date: 01/05/2016 CLINICAL DATA:  Chest pain, cough. History of diabetes, cardiomyopathy/CHF. History of RIGHT thoracentesis. EXAM: CHEST  2 VIEW COMPARISON:  Chest radiograph January 07, 2015 and CT chest January 02, 2015 FINDINGS: Moderate to large RIGHT pleural effusion and underlying consolidation. Mild bronchitic changes. RIGHT heart border obscured. Mildly calcified aortic knob. Single lead LEFT cardiac pacemaker in situ. No pneumothorax. Old RIGHT lateral rib fractures. IMPRESSION: Moderate to large recurrent RIGHT pleural effusion with underlying consolidation, recommend follow up to exclude mass. Mild bronchitic changes. Electronically Signed   By: Awilda Metro M.D.   On: 01/05/2016 02:04   Ct Chest W Contrast  Result Date: 01/05/2016 CLINICAL DATA:  Sudden onset of  left-sided chest pain EXAM: CT CHEST WITH CONTRAST TECHNIQUE:  Multidetector CT imaging of the chest was performed during intravenous contrast administration. CONTRAST:  75mL ISOVUE-300 IOPAMIDOL (ISOVUE-300) INJECTION 61% COMPARISON:  Chest radiograph 01/05/2016, CT chest 01/02/2015 FINDINGS: Cardiovascular: Atherosclerotic calcifications within the aorta. No aneurysm. Extensive coronary artery calcifications. Heart size upper normal. Partially visualized cardiac pacing lead. No large pericardial effusion. Mediastinum/Nodes: Stable nonspecific mediastinal lymph nodes. Trachea is midline. Thyroid gland unremarkable. Esophagus is within normal limits. Lungs/Pleura: Large right-sided pleural effusion with consolidation in the right lower lobe and partial consolidation in the right middle lobe. The left lung demonstrates scattered foci of hazy density in the lingula and left lower lobe which may reflect diffuse atelectasis or possibly mild edema. No pneumothorax. No left effusion. Upper Abdomen: Upper abdominal structures show no acute abnormalities. Ascites adjacent to the spleen and liver. Musculoskeletal: No suspicious bone lesions. Old right-sided rib fractures IMPRESSION: 1. Large right-sided pleural effusion with consolidation in the right lower lobe. Partial consolidation in the right middle lobe, cannot rule out a pneumonia. No significant left pleural effusion. Scattered mild densities within the left upper and lower lobes could reflect scattered foci of atelectasis or mild edema. 2. Ascites in the upper abdomen 3. No change in mild mediastinal adenopathy. Electronically Signed   By: Jasmine Pang M.D.   On: 01/05/2016 03:44   US Thoracentesis Asp Pleural Space W/img Guide  Result Date: 01/05/2016 INDICATION: Congestive heart failure. History of right pleural effusion one year ago. Right chest pain. Large right pleural effusion. Request for diagnostic and therapeutic thoracentesis. EXAM: ULTRASOUND GUIDED  RIGHT THORACENTESIS MEDICATIONS: 1% Lidocaine = 10 ml. COMPLICATIONS: None immediate. PROCEDURE: An ultrasound guided thoracentesis was thoroughly discussed with the patient and questions answered. The benefits, risks, alternatives and complications were also discussed. The patient understands and wishes to proceed with the procedure. Written consent was obtained. Ultrasound was performed to localize and mark an adequate pocket of fluid in the right chest. The area was then prepped and draped in the normal sterile fashion. 1% Lidocaine was used for local anesthesia. Under ultrasound guidance a 6 Fr Safe-T-Centesis catheter was introduced. Thoracentesis was performed. The catheter was removed and a dressing applied. FINDINGS: A total of approximately 2 liters of clear yellow fluid was removed. Samples were sent to the laboratory as requested by the clinical team. IMPRESSION: Successful ultrasound guided right thoracentesis yielding 2 liters of pleural fluid. Post procedure ultrasound revealed remaining moderate effusion. Recommend repeat thoracentesis tomorrow. Read by:  Corrin Parker, PA-C Electronically Signed   By: Judie Petit.  Shick M.D.   On: 01/05/2016 10:23     LOS: 4 days   Leroy Sea, MD  [facility] Pager:[phone]  If 7PM-7AM, please contact night-coverage [email or web address] Password [email or web address] 01/09/2016, 10:22 AM

## 2016-01-10 ENCOUNTER — Inpatient Hospital Stay (HOSPITAL_COMMUNITY): Payer: BLUE CROSS/BLUE SHIELD

## 2016-01-10 LAB — BASIC METABOLIC PANEL
Anion gap: 8 (ref 5–15)
BUN: 13 mg/dL (ref 6–20)
CHLORIDE: 93 mmol/L — AB (ref 101–111)
CO2: 31 mmol/L (ref 22–32)
CREATININE: 0.86 mg/dL (ref 0.61–1.24)
Calcium: 8.3 mg/dL — ABNORMAL LOW (ref 8.9–10.3)
GFR calc Af Amer: >60 mL/min (ref >60)
GFR calc non Af Amer: >60 mL/min (ref >60)
GLUCOSE: 190 mg/dL — AB (ref 65–99)
POTASSIUM: 4.1 mmol/L (ref 3.5–5.1)
SODIUM: 132 mmol/L — AB (ref 135–145)

## 2016-01-10 LAB — MAGNESIUM: Magnesium: 2 mg/dL (ref 1.7–2.4)

## 2016-01-10 LAB — CULTURE, BLOOD (ROUTINE X 2)
CULTURE: NO GROWTH
CULTURE: NO GROWTH

## 2016-01-10 LAB — GLUCOSE, CAPILLARY
GLUCOSE-CAPILLARY: 257 mg/dL — AB (ref 65–99)
GLUCOSE-CAPILLARY: 234 mg/dL — AB (ref 65–99)
Glucose-Capillary: 209 mg/dL — ABNORMAL HIGH (ref 65–99)
Glucose-Capillary: 214 mg/dL — ABNORMAL HIGH (ref 65–99)

## 2016-01-10 LAB — POCT I-STAT 3, VENOUS BLOOD GAS (G3P V)
Acid-Base Excess: 6 mmol/L — ABNORMAL HIGH (ref 0.0–2.0)
Acid-Base Excess: 6 mmol/L — ABNORMAL HIGH (ref 0.0–2.0)
Bicarbonate: 31.7 mmol/L — ABNORMAL HIGH (ref 20.0–28.0)
Bicarbonate: 32 mmol/L — ABNORMAL HIGH (ref 20.0–28.0)
O2 Saturation: 59 %
O2 Saturation: 59 %
PCO2 VEN: 49.1 mmHg (ref 44.0–60.0)
PCO2 VEN: 49.4 mmHg (ref 44.0–60.0)
PH VEN: 7.415 (ref 7.250–7.430)
PH VEN: 7.423 (ref 7.250–7.430)
PO2 VEN: 31 mmHg — AB (ref 32.0–45.0)
TCO2: 33 mmol/L (ref 0–100)
TCO2: 34 mmol/L (ref 0–100)
pO2, Ven: 31 mmHg — CL (ref 32.0–45.0)

## 2016-01-10 LAB — CULTURE, BODY FLUID W GRAM STAIN -BOTTLE: Culture: NO GROWTH

## 2016-01-10 LAB — POCT ACTIVATED CLOTTING TIME: ACTIVATED CLOTTING TIME: 180 s

## 2016-01-10 LAB — COOXEMETRY PANEL
Carboxyhemoglobin: 1.7 % — ABNORMAL HIGH (ref 0.5–1.5)
Methemoglobin: 0.8 % (ref 0.0–1.5)
O2 Saturation: 87.4 %
Total hemoglobin: 13.4 g/dL (ref 12.0–16.0)

## 2016-01-10 LAB — CULTURE, BODY FLUID-BOTTLE

## 2016-01-10 MED ORDER — POTASSIUM CHLORIDE CRYS ER 20 MEQ PO TBCR
40.0000 meq | EXTENDED_RELEASE_TABLET | Freq: Every day | ORAL | Status: DC
  Administered 2016-01-10 – 2016-01-11 (×2): 40 meq via ORAL
  Filled 2016-01-10 (×2): qty 2

## 2016-01-10 MED ORDER — LOSARTAN POTASSIUM 25 MG PO TABS
12.5000 mg | ORAL_TABLET | Freq: Two times a day (BID) | ORAL | Status: DC
  Administered 2016-01-10 – 2016-01-11 (×3): 12.5 mg via ORAL
  Filled 2016-01-10 (×4): qty 1

## 2016-01-10 MED ORDER — TORSEMIDE 20 MG PO TABS
80.0000 mg | ORAL_TABLET | Freq: Every day | ORAL | Status: DC
  Administered 2016-01-10 – 2016-01-11 (×2): 80 mg via ORAL
  Filled 2016-01-10 (×2): qty 4

## 2016-01-10 NOTE — Progress Notes (Signed)
Physical Therapy Treatment Patient Details Name: [name] MRN: [id number] DOB: [birth year] Today's Date: Jan 17, 2016    History of Present Illness Pt is a 61 y/o male admitted secondary to SOB and cough. Pt found to have an acute CHF exacerbation with volume overload. PMH including but not limited to DM, CHF, CAD, HTN, PVD, AICD (2010) and fem-pop bypass grafts in 2015 and 2016.    PT Comments    Pt doing well with mobility. Able to maintain SpO2 91% or greater with amb on RA.   Follow Up Recommendations  No PT follow up     Equipment Recommendations  None recommended by PT    Recommendations for Other Services       Precautions / Restrictions Precautions Precautions: Fall Restrictions Weight Bearing Restrictions: No    Mobility  Bed Mobility                  Transfers Overall transfer level: Needs assistance Equipment used: None Transfers: Sit to/from Stand Sit to Stand: Min guard         General transfer comment: Incr time and effort  Ambulation/Gait Ambulation/Gait assistance: Supervision Ambulation Distance (Feet): 500 Feet Assistive device:  (Pushed IV pole) Gait Pattern/deviations: Step-through pattern;Decreased stride length;Drifts right/left Gait velocity: decreased Gait velocity interpretation: Below normal speed for age/gender General Gait Details: Slightly unsteady but no overt loss of balance. Amb on RA with SpO2 91% or greater with amb   Stairs            Wheelchair Mobility    Modified Rankin (Stroke Patients Only)       Balance Overall balance assessment: Needs assistance Sitting-balance support: Feet supported;No upper extremity supported Sitting balance-Leahy Scale: Good     Standing balance support: No upper extremity supported;During functional activity Standing balance-Leahy Scale: Fair                      Cognition Arousal/Alertness: Awake/alert Behavior During Therapy: WFL for tasks  assessed/performed Overall Cognitive Status: Within Functional Limits for tasks assessed                      Exercises      General Comments        Pertinent Vitals/Pain Pain Assessment: No/denies pain    Home Living                      Prior Function            PT Goals (current goals can now be found in the care plan section) Progress towards PT goals: Progressing toward goals;Goals met and updated - see care plan    Frequency    Min 3X/week      PT Plan Discharge plan needs to be updated    Co-evaluation             End of Session   Activity Tolerance: Patient tolerated treatment well Patient left: in bed;with call bell/phone within reach (sitting EOB)     Time: 4035-2481 PT Time Calculation (min) (ACUTE ONLY): 21 min  Charges:  $Gait Training: 8-22 mins                    G CodesShary [facility] 2016/01/17, 9:57 AM Suanne Marker PT [phone]

## 2016-01-10 NOTE — Progress Notes (Signed)
Patient ID: [name], male   DOB: [birth year], 61 y.o.   MRN: [id number]    SUBJECTIVE:   Underwent cath 1/5 with severe 3vCAD and elevated filling pressures. Output ok on dobutamine. Seen by Dr. PVT and not felt to be CABG candidate due to poor cardiac reserve, lack of saphenous vein conduits and other co-morbidities.  He diuresed well, weight down another 3 lbs.  Now off diuretics, CVP 8 this morning.  Co-ox 87%.  No dyspnea.    L/R heart cath 01/07/16: Severe CAD LM: ok LAD: long 80% calcified lesion LCX: total OM1 RCA:  Distal 80% at trifurcation RA mean 21 RV 69/24 PA 69/31, mean 45 PCWP mean 27 Cardiac Output (Fick) 5.36 (on dobutamine 2.5) Cardiac Index (Fick) 2.37   Echo (1/18): EF 20%, global hypokinesis, very mildly decreased RV systolic function.   Status post thoracentesis: Exudate by Light's criteria (total protein).  Fluid cultures negative so far.  Scheduled Meds: . aspirin EC  81 mg Oral Daily  . atorvastatin  80 mg Oral QODAY  . digoxin  0.125 mg Oral Daily  . ezetimibe  10 mg Oral Daily  . heparin  5,000 Units Subcutaneous Q8H  . insulin aspart  0-9 Units Subcutaneous TID WC  . insulin glargine  12 Units Subcutaneous QHS  . levofloxacin (LEVAQUIN) IV  750 mg Intravenous Q24H  . losartan  12.5 mg Oral BID  . potassium chloride SA  40 mEq Oral Daily  . sodium chloride flush  3 mL Intravenous Q12H  . spironolactone  25 mg Oral Daily  . torsemide  80 mg Oral Daily   Continuous Infusions:  PRN Meds:.sodium chloride, acetaminophen, guaiFENesin-dextromethorphan, ipratropium-albuterol, ondansetron (ZOFRAN) IV    Vitals:   01/09/16 2311 01/10/16 0405 01/10/16 0457 01/10/16 0715  BP: 126/69 139/72  126/67  Pulse: 83 84  78  Resp: 18 18  15   Temp: 98 F (36.7 C) 97.9 F (36.6 C)  97.6 F (36.4 C)  TempSrc: Oral Oral  Oral  SpO2: 96% 96%  98%  Weight:   214 lb 11.2 oz (97.4 kg)   Height:        Intake/Output Summary (Last 24 hours) at 01/10/16  0800 Last data filed at 01/10/16 0459  Gross per 24 hour  Intake          1409.23 ml  Output             2875 ml  Net         -1465.77 ml    LABS: Basic Metabolic Panel:  Recent Labs  78/46/96 0434 01/10/16 0424  NA 133* 132*  K 3.8 4.1  CL 90* 93*  CO2 35* 31  GLUCOSE 175* 190*  BUN 15 13  CREATININE 0.88 0.86  CALCIUM 8.4* 8.3*  MG 1.9 2.0   Liver Function Tests: No results for input(s): AST, ALT, ALKPHOS, BILITOT, PROT, ALBUMIN in the last 72 hours. No results for input(s): LIPASE, AMYLASE in the last 72 hours. CBC:  Recent Labs  01/08/16 0825 01/09/16 0434  WBC 5.3 4.9  NEUTROABS 4.1  --   HGB 12.8* 12.7*  HCT 39.1 39.8  MCV 85.7 85.6  PLT 105* 113*   Cardiac Enzymes: No results for input(s): CKTOTAL, CKMB, CKMBINDEX, TROPONINI in the last 72 hours. BNP: Invalid input(s): POCBNP D-Dimer: No results for input(s): DDIMER in the last 72 hours. Hemoglobin A1C: No results for input(s): HGBA1C in the last 72 hours. Fasting Lipid Panel: No results for input(s):  CHOL, HDL, LDLCALC, TRIG, CHOLHDL, LDLDIRECT in the last 72 hours. Thyroid Function Tests: No results for input(s): TSH, T4TOTAL, T3FREE, THYROIDAB in the last 72 hours.  Invalid input(s): FREET3 Anemia Panel: No results for input(s): VITAMINB12, FOLATE, FERRITIN, TIBC, IRON, RETICCTPCT in the last 72 hours.  RADIOLOGY: Dg Chest 1 View  Result Date: 01/05/2016 CLINICAL DATA:  Post right thoracentesis. EXAM: CHEST 1 VIEW COMPARISON:  01/05/2016 FINDINGS: Moderate right pleural effusion with right lower lobe atelectasis or infiltrate. No pneumothorax following thoracentesis. Mild cardiomegaly. Left AICD is unchanged. Left lung is clear. IMPRESSION: Moderate right pleural effusion with right lower lobe atelectasis or infiltrate. No pneumothorax. Electronically Signed   By: Charlett Nose M.D.   On: 01/05/2016 10:38   Dg Chest 2 View  Result Date: 01/06/2016 CLINICAL DATA:  Three weeks of coughing and  chest pain. History of CHF, community-acquired pneumonia, pleural effusions, and coronary artery disease. The patient has a permanent pacemaker defibrillator. EXAM: CHEST  2 VIEW COMPARISON:  Portable chest x-ray of January 05, 2016 FINDINGS: There remains increased density in the mid and lower right lung compatible with pleural fluid layering posteriorly. There is a tiny left pleural effusion. The left lung is well-expanded. There are coarse lung markings posteriorly in the right lower lung. The interstitial markings are increased bilaterally. The cardiac silhouette is enlarged. The pulmonary vascularity is engorged. The ICD is in stable position. There is calcification in the wall of the aortic arch. IMPRESSION: There is a moderate-sized right pleural effusion and trace left pleural effusion. There is pulmonary interstitial edema secondary to CHF. There may be right basilar atelectasis or pneumonia. There has been slight interval deterioration in the appearance of the pulmonary interstitium bilaterally since yesterday's study. Thoracic aortic atherosclerosis. Electronically Signed   By: David  [place] M.D.   On: 01/06/2016 08:05   Dg Chest 2 View  Result Date: 01/05/2016 CLINICAL DATA:  Chest pain, cough. History of diabetes, cardiomyopathy/CHF. History of RIGHT thoracentesis. EXAM: CHEST  2 VIEW COMPARISON:  Chest radiograph January 07, 2015 and CT chest January 02, 2015 FINDINGS: Moderate to large RIGHT pleural effusion and underlying consolidation. Mild bronchitic changes. RIGHT heart border obscured. Mildly calcified aortic knob. Single lead LEFT cardiac pacemaker in situ. No pneumothorax. Old RIGHT lateral rib fractures. IMPRESSION: Moderate to large recurrent RIGHT pleural effusion with underlying consolidation, recommend follow up to exclude mass. Mild bronchitic changes. Electronically Signed   By: Awilda Metro M.D.   On: 01/05/2016 02:04   Ct Chest W Contrast  Result Date: 01/05/2016 CLINICAL  DATA:  Sudden onset of left-sided chest pain EXAM: CT CHEST WITH CONTRAST TECHNIQUE: Multidetector CT imaging of the chest was performed during intravenous contrast administration. CONTRAST:  75mL ISOVUE-300 IOPAMIDOL (ISOVUE-300) INJECTION 61% COMPARISON:  Chest radiograph 01/05/2016, CT chest 01/02/2015 FINDINGS: Cardiovascular: Atherosclerotic calcifications within the aorta. No aneurysm. Extensive coronary artery calcifications. Heart size upper normal. Partially visualized cardiac pacing lead. No large pericardial effusion. Mediastinum/Nodes: Stable nonspecific mediastinal lymph nodes. Trachea is midline. Thyroid gland unremarkable. Esophagus is within normal limits. Lungs/Pleura: Large right-sided pleural effusion with consolidation in the right lower lobe and partial consolidation in the right middle lobe. The left lung demonstrates scattered foci of hazy density in the lingula and left lower lobe which may reflect diffuse atelectasis or possibly mild edema. No pneumothorax. No left effusion. Upper Abdomen: Upper abdominal structures show no acute abnormalities. Ascites adjacent to the spleen and liver. Musculoskeletal: No suspicious bone lesions. Old right-sided rib fractures IMPRESSION: 1. Large  right-sided pleural effusion with consolidation in the right lower lobe. Partial consolidation in the right middle lobe, cannot rule out a pneumonia. No significant left pleural effusion. Scattered mild densities within the left upper and lower lobes could reflect scattered foci of atelectasis or mild edema. 2. Ascites in the upper abdomen 3. No change in mild mediastinal adenopathy. Electronically Signed   By: Jasmine Pang M.D.   On: 01/05/2016 03:44   Dg Chest Port 1 View  Result Date: 01/10/2016 CLINICAL DATA:  Pneumonia. EXAM: PORTABLE CHEST 1 VIEW COMPARISON:  01/06/2016. FINDINGS: Right PICC line its tip noted at the cavoatrial junction. Cardiac pacer stable position. Stable cardiomegaly . Diffuse right  lung infiltrate noted consistent with pneumonia prominent right pleural effusion. No pneumothorax. IMPRESSION: 1.  Right PICC line tip noted at the cavoatrial junction. 2. Prominent diffuse right lung infiltrate consistent with pneumonia. Right pleural effusion. 3.  Cardiac pacer stable position.  Persistent cardiomegaly. Electronically Signed   By: Maisie Fus  Register   On: 01/10/2016 06:54   US Thoracentesis Asp Pleural Space W/img Guide  Result Date: 01/05/2016 INDICATION: Congestive heart failure. History of right pleural effusion one year ago. Right chest pain. Large right pleural effusion. Request for diagnostic and therapeutic thoracentesis. EXAM: ULTRASOUND GUIDED RIGHT THORACENTESIS MEDICATIONS: 1% Lidocaine = 10 ml. COMPLICATIONS: None immediate. PROCEDURE: An ultrasound guided thoracentesis was thoroughly discussed with the patient and questions answered. The benefits, risks, alternatives and complications were also discussed. The patient understands and wishes to proceed with the procedure. Written consent was obtained. Ultrasound was performed to localize and mark an adequate pocket of fluid in the right chest. The area was then prepped and draped in the normal sterile fashion. 1% Lidocaine was used for local anesthesia. Under ultrasound guidance a 6 Fr Safe-T-Centesis catheter was introduced. Thoracentesis was performed. The catheter was removed and a dressing applied. FINDINGS: A total of approximately 2 liters of clear yellow fluid was removed. Samples were sent to the laboratory as requested by the clinical team. IMPRESSION: Successful ultrasound guided right thoracentesis yielding 2 liters of pleural fluid. Post procedure ultrasound revealed remaining moderate effusion. Recommend repeat thoracentesis tomorrow. Read by:  Corrin Parker, PA-C Electronically Signed   By: Judie Petit.  Shick M.D.   On: 01/05/2016 10:23    PHYSICAL EXAM General: NAD  Neck: JVP 6, no thyromegaly or thyroid nodule.  Lungs:  coarse bilaterally CV: Nondisplaced PMI.  Heart regular S1/S2, no S3/S4, 1/6 SEM RUSB.  No peripheral edema.  No carotid bruit.  Normal pedal pulses.  Abdomen: Soft, nontender, no hepatosplenomegaly, mild distention.  Neurologic: Alert and oriented x 3.  Psych: Flat affect. Somewhat oppostional Extremities: No clubbing or cyanosis. Severe chronic venous stasis changes lower legs.   TELEMETRY: Reviewed telemetry pt in NSR  ASSESSMENT AND PLAN: 61 yo with history of PAD (s/p fem-pop), HTN, DM, CAD, ischemic cardiomyopathy/chronic systolic CHF presented with gradual worsening of volume overload and dyspnea.  1. Acute on chronic systolic CHF: Echo 1/18 with EF 20% and very mildly decreased RV systolic function.  Ischemic cardiomyopathy, has [employer] ICD.  Cath results 1/6 as above with severe CAD and elevated filling pressures.  Responded well to IV diuresis and dobutamine. Now euvolemic. Co-ox looks good.  - Stop dobutamine today, repeat co-ox in am. - Start torsemide 80 mg daily (was on 60 daily at home).  - Continue digoxin and spironolactone - Start losartan 12.5 mg bid, if BP stable can resume Entresto eventually.  - Turned down for CABG by  Dr. PVT given comorbidities and lack of venous conduits.  2. PNA: Right-sided PNA noted on chest CT.  He is on levofloxacin per primary service.  Afebrile.  3. Right pleural effusion: s/p thoracentesis Exudative by Light's criteria, parapneumonic.  4. CAD:  See discussion above regarding LHC/RHC. Turned down for CABG.  - Continue ASA, statin, Zetia.  - Cath films reviewed with Dr. Excell Seltzerooper.  All disease appears chronic.  RCA would be difficult to intervene upon and OM is a subtotal occlusion.  Think it would be reasonable to intervene upon proximal LAD, will likely bring back in a couple weeks after he is stabilized post-current admission for CHF and PNA.  5. Smoking: Discussed cessation.  Ambulate in halls, hopefully home soon.   Karlisa Gaubert,MD 8:00  AM 01/10/2016

## 2016-01-10 NOTE — Progress Notes (Signed)
CARDIAC REHAB PHASE I   PRE:  Rate/Rhythm: 96 SR    BP: sitting 127/93    SaO2: 98 RA  MODE:  Ambulation: 470 ft   POST:  Rate/Rhythm: 106 ST    BP: sitting 153/84     SaO2: 97 RA  Pt able to walk all around unit although did have 2-3 LOBs pushing IV pole (he did not want to use RW). He admits that he is more unsteady this pm than in the am today. Return to edge of bed. Began some education. Pt is rather set in his ways. Sts he doesn't weigh daily. Doesn't add salt to food but also sound like he eats some processed food. He has quit smoking for 30-45 days numerous times. Will continue to discuss these things. Left pt information to read and fake cigarette. [id number]   Harriet MassonRandi Kristan Ogechi Kuehnel CES, ACSM 01/10/2016 2:56 PM

## 2016-01-10 NOTE — Progress Notes (Signed)
Inpatient Diabetes Program Recommendations  AACE/ADA: New Consensus Statement on Inpatient Glycemic Control (2015)  Target Ranges:  Prepandial:   less than 140 mg/dL      Peak postprandial:   less than 180 mg/dL (1-2 hours)      Critically ill patients:  140 - 180 mg/dL   Results for Dalton RainwaterFULP, [name] (MRN [id number]) as of 01/10/2016 13:21  Ref. Range 01/09/2016 07:59 01/09/2016 11:38 01/09/2016 16:34 01/09/2016 21:03 01/10/2016 07:34 01/10/2016 11:28  Glucose-Capillary Latest Ref Range: 65 - 99 mg/dL 147149 (H) 829211 (H) 562213 (H) 187 (H) 257 (H) 234 (H)   Review of Glycemic Control  Diabetes history: DM 2 Outpatient Diabetes medications: 70/30 32 units QAM, 16 units QPM Current orders for Inpatient glycemic control: Lantus 12 units QHS, Novolog Sensitive TID  A1c 7.6% on 01/05/16  Inpatient Diabetes Program Recommendations:   70/30 home dose 24 hour basal insulin equivalent 33.6 units, short acting equivalent 14.4 units. Glucose trend in the 200's. Consider increasing basal insulin (Lantus 14-16 units) or adding a small amount of meal coverage (Novolog 2 units TID) if patient is eating at least 50% of meals.  Thanks,  Christena DeemShannon Jaiya Mooradian RN, MSN, [facility] Inpatient Diabetes Coordinator Team Pager [phone] (8a-5p)

## 2016-01-10 NOTE — Progress Notes (Signed)
PROGRESS NOTE        PATIENT DETAILS Name: [name] Age: 61 y.o. Sex: male Date of Birth: 1955/06/25 Admit Date: 01/05/2016 Admitting Physician Eduard Clos, MD WUJ:[id number],NWGN Larita Fife, MD  Brief Narrative:  Patient is a 61 y.o. male with hx of ischemic cardiomyopathy (last EF 20-25 % on 01/03/15) presented with worsening SOB and left sided chest pain. Found to have acute on chronic systolic CHF and moderate pleural effusion-underwent thoracontesis on 1/4. See below for details.   Subjective:  Sitting up in bed, no headache, no chest or abdominal pain. Today not short of breath.  Assessment/Plan:  Acute Hypoxemic Resp Failure: multifactorial etiology-due to acute on chronic systolic CHF, ? CAP, pleural effusion. Continue diuresis, taper down antibiotics, continue supportive care. Taper off O2 as tolerated, clinically better.  Acute on Chronic Systolic CHF (last EF 20-25 % on 01/03/15) due to ischemic cardiomyopathy: Clinically much better, has been diuresed well, dobutamine drip to be stopped on 01/10/2016 by cardiology, continue diuresis by Demadex, continue oral digoxin, & ARB will defer management to cardiology. Seen by cardiac thoracic surgery for now Medical management only. Has [employer] AICD.  Right Pleural Effusion: Exudative by light's criteria, probably parapneumonic effusion. Pleural fluid cultures negative so far. Even though this is a exudative pleural effusion, given ascites-and significant volume overload-suspect some of it is due to CHF. Pleural fluid cytology negative. So far 14 L negative.  ? CAP: CT chest confirms consolidation in the right middle lobe-continue Levaquin for a total of 5 days. Influenza PCR negative, blood cultures negative so far.  Clinically infection has resolved question if large part of chest x-ray finding is atelectasis, IS added.  Hx of CAD (MI 2005 LAD stent W/ BMS CFX, DES x 2 RCA): continue ASA and statin for  secondary prevention, no Coreg for now per cardiology as per pressure was low, chest pain-free, severe three-vessel disease per left heart cath however per cardiac thoracic surgery which. Defer medical management to cardiology.  Hx of PVD-s/p L fem-pop 11/2014:continue ASA/Statin  Dyslipidemia:Continue Lipitor and Zetia  Tobacco Abuse: counseled  Hypokalemia and hypomagnesemia. Replaced will monitor.   DM-2:CBG's with Lantus 12 units, and SSI- follow  CBG (last 3)   Recent Labs  01/09/16 1634 01/09/16 2103 01/10/16 0734  GLUCAP 213* 187* 257*     DVT Prophylaxis: Prophylactic Lovenox   Code Status: Full code   Family Communication: None at bedside  Disposition Plan: Remain inpatient- will require several more days of hospitalization.  Antimicrobial agents: Anti-infectives    Start     Dose/Rate Route Frequency Ordered Stop   01/07/16 0600  levofloxacin (LEVAQUIN) IVPB 750 mg     750 mg 100 mL/hr over 90 Minutes Intravenous Every 24 hours 01/06/16 0826 01/12/16 0559   01/06/16 0600  levofloxacin (LEVAQUIN) IVPB 500 mg  Status:  Discontinued     500 mg 100 mL/hr over 60 Minutes Intravenous Every 24 hours 01/05/16 0811 01/06/16 0826   01/05/16 0300  levofloxacin (LEVAQUIN) IVPB 500 mg     500 mg 100 mL/hr over 60 Minutes Intravenous  Once 01/05/16 0249 01/05/16 0604      Procedures:  TEE - Compared to a prior study in 01/2015, the LVEF is unchanged at around 20% with severe global hypokinesis.  Thoracocentesis by IR 1/4>> borderline exudate  LHC/RHC 1/5>> 1. Elevated left  and right heart filling pressures with high CVP/PCWP ratio.   2. Pulmonary venous hypertension.  3. Cardiac output acceptable on 2.5 dobutamine.  4. Extensive 3 vessel CAD with suspected hemodynamically significant stenoses in the proximal LAD, distal RCA, and OM1.    CONSULTS:  cardiology  Time spent: 25- minutes-Greater than 50% of this time was spent in counseling, explanation of  diagnosis, planning of further management, and coordination of care.  MEDICATIONS: Scheduled Meds: . aspirin EC  81 mg Oral Daily  . atorvastatin  80 mg Oral QODAY  . digoxin  0.125 mg Oral Daily  . ezetimibe  10 mg Oral Daily  . heparin  5,000 Units Subcutaneous Q8H  . insulin aspart  0-9 Units Subcutaneous TID WC  . insulin glargine  12 Units Subcutaneous QHS  . levofloxacin (LEVAQUIN) IV  750 mg Intravenous Q24H  . losartan  12.5 mg Oral BID  . potassium chloride SA  40 mEq Oral Daily  . sodium chloride flush  3 mL Intravenous Q12H  . spironolactone  25 mg Oral Daily  . torsemide  80 mg Oral Daily   Continuous Infusions:  PRN Meds:.sodium chloride, acetaminophen, guaiFENesin-dextromethorphan, ipratropium-albuterol, ondansetron (ZOFRAN) IV   PHYSICAL EXAM: Vital signs: Vitals:   01/10/16 0405 01/10/16 0457 01/10/16 0715 01/10/16 0905  BP: 139/72  126/67   Pulse: 84  78 (!) 0  Resp: 18  15   Temp: 97.9 F (36.6 C)  97.6 F (36.4 C)   TempSrc: Oral  Oral   SpO2: 96%  98%   Weight:  97.4 kg (214 lb 11.2 oz)    Height:       Filed Weights   01/08/16 0354 01/09/16 0500 01/10/16 0457  Weight: 103.8 kg (228 lb 14.4 oz) 98.6 kg (217 lb 4.8 oz) 97.4 kg (214 lb 11.2 oz)   Body mass index is 29.12 kg/m.   General appearance :Awake, alert, not in any distress.  Eyes:, pupils equally reactive to light and accomodation,no scleral icterus. HEENT: Atraumatic and Normocephalic Neck: supple, no JVD. No cervical lymphadenopathy. No thyromegaly Resp:Decreased air entry at right base-but otherwise clear CVS: S1 S2 regular, GI: Bowel sounds present, Non tender and not distended but obese with no gaurding, rigidity or rebound.No organomegaly Extremities: B/L Lower Ext shows + edema in his thighs- both legs are warm to touch Neurology:  speech clear,Non focal, sensation is grossly intact. Psychiatric: Normal judgment and insight. Alert and oriented x 3. Normal  mood. Musculoskeletal:No digital cyanosis Skin:chronic venous stasis changes with scaling in bilateral lower ext  I have personally reviewed following labs and imaging studies  LABORATORY DATA: CBC:  Recent Labs Lab 01/05/16 0110 01/07/16 0344 01/08/16 0825 01/09/16 0434  WBC 6.3 4.9 5.3 4.9  NEUTROABS  --   --  4.1  --   HGB 12.1* 12.5* 12.8* 12.7*  HCT 36.9* 39.4 39.1 39.8  MCV 84.8 88.5 85.7 85.6  PLT 121* 104* 105* 113*    Basic Metabolic Panel:  Recent Labs Lab 01/06/16 1042 01/07/16 0344 01/08/16 0825 01/09/16 0434 01/10/16 0424  NA 134* 137 133* 133* 132*  K 3.4* 4.0 3.2* 3.8 4.1  CL 96* 97* 89* 90* 93*  CO2 26 32 33* 35* 31  GLUCOSE 139* 149* 167* 175* 190*  BUN 25* 22* 15 15 13   CREATININE 1.32* 1.02 0.78 0.88 0.86  CALCIUM 8.5* 8.5* 8.6* 8.4* 8.3*  MG  --  1.9 1.6* 1.9 2.0    GFR: Estimated Creatinine Clearance: 109.1 mL/min (by  C-G formula based on SCr of 0.86 mg/dL).  Liver Function Tests:  Recent Labs Lab 01/06/16 1042  AST 27  ALT 15*  ALKPHOS 108  BILITOT 1.4*  PROT 7.1  ALBUMIN 2.9*   No results for input(s): LIPASE, AMYLASE in the last 168 hours. No results for input(s): AMMONIA in the last 168 hours.  Coagulation Profile:  Recent Labs Lab 01/07/16 0344  INR 1.32    Cardiac Enzymes:  Recent Labs Lab 01/05/16 1001 01/05/16 1913 01/06/16 0004 01/06/16 0336 01/06/16 1042  TROPONINI <0.03 <0.03 <0.03 <0.03 <0.03    BNP (last 3 results) No results for input(s): PROBNP in the last 8760 hours.  HbA1C: No results for input(s): HGBA1C in the last 72 hours.  CBG:  Recent Labs Lab 01/09/16 0759 01/09/16 1138 01/09/16 1634 01/09/16 2103 01/10/16 0734  GLUCAP 149* 211* 213* 187* 257*    Lipid Profile: No results for input(s): CHOL, HDL, LDLCALC, TRIG, CHOLHDL, LDLDIRECT in the last 72 hours.  Thyroid Function Tests: No results for input(s): TSH, T4TOTAL, FREET4, T3FREE, THYROIDAB in the last 72 hours.  Anemia  Panel: No results for input(s): VITAMINB12, FOLATE, FERRITIN, TIBC, IRON, RETICCTPCT in the last 72 hours.  Urine analysis:    Component Value Date/Time   COLORURINE AMBER (A) 01/05/2016 2353   APPEARANCEUR HAZY (A) 01/05/2016 2353   LABSPEC 1.028 01/05/2016 2353   PHURINE 5.0 01/05/2016 2353   GLUCOSEU NEGATIVE 01/05/2016 2353   HGBUR SMALL (A) 01/05/2016 2353   BILIRUBINUR NEGATIVE 01/05/2016 2353   KETONESUR NEGATIVE 01/05/2016 2353   PROTEINUR NEGATIVE 01/05/2016 2353   UROBILINOGEN 1.0 11/06/2014 0906   NITRITE NEGATIVE 01/05/2016 2353   LEUKOCYTESUR MODERATE (A) 01/05/2016 2353    Sepsis Labs: Lactic Acid, Venous    Component Value Date/Time   LATICACIDVEN 1.23 01/05/2016 0413    MICROBIOLOGY: Recent Results (from the past 240 hour(s))  Culture, blood (routine x 2) Call MD if unable to obtain prior to antibiotics being given     Status: None (Preliminary result)   Collection Time: 01/05/16  1:25 AM  Result Value Ref Range Status   Specimen Description BLOOD RIGHT ANTECUBITAL  Final   Special Requests BOTTLES DRAWN AEROBIC AND ANAEROBIC 5CC  Final   Culture NO GROWTH 4 DAYS  Final   Report Status PENDING  Incomplete  Culture, blood (routine x 2) Call MD if unable to obtain prior to antibiotics being given     Status: None (Preliminary result)   Collection Time: 01/05/16  8:29 AM  Result Value Ref Range Status   Specimen Description BLOOD RIGHT ANTECUBITAL  Final   Special Requests BOTTLES DRAWN AEROBIC AND ANAEROBIC  5CC  Final   Culture NO GROWTH 4 DAYS  Final   Report Status PENDING  Incomplete  Culture, body fluid-bottle     Status: None (Preliminary result)   Collection Time: 01/05/16 10:24 AM  Result Value Ref Range Status   Specimen Description FLUID PERITONEAL  Final   Special Requests NONE  Final   Culture NO GROWTH 4 DAYS  Final   Report Status PENDING  Incomplete  Gram stain     Status: None   Collection Time: 01/05/16 10:24 AM  Result Value Ref  Range Status   Specimen Description FLUID PERITONEAL  Final   Special Requests NONE  Final   Gram Stain   Final    RARE WBC PRESENT, PREDOMINANTLY MONONUCLEAR NO ORGANISMS SEEN    Report Status 01/05/2016 FINAL  Final  Culture, sputum-assessment  Status: None   Collection Time: 01/05/16  9:09 PM  Result Value Ref Range Status   Specimen Description SPUTUM  Final   Special Requests NONE  Final   Sputum evaluation THIS SPECIMEN IS ACCEPTABLE FOR SPUTUM CULTURE  Final   Report Status 01/06/2016 FINAL  Final  Culture, respiratory (NON-Expectorated)     Status: None   Collection Time: 01/05/16  9:09 PM  Result Value Ref Range Status   Specimen Description SPUTUM  Final   Special Requests NONE Reflexed from [id number]  Final   Gram Stain   Final    ABUNDANT WBC PRESENT, PREDOMINANTLY PMN ABUNDANT GRAM POSITIVE COCCI IN CLUSTERS IN PAIRS FEW GRAM NEGATIVE RODS FEW GRAM POSITIVE RODS    Culture Consistent with normal respiratory flora.  Final   Report Status 01/08/2016 FINAL  Final  MRSA PCR Screening     Status: None   Collection Time: 01/06/16  8:21 PM  Result Value Ref Range Status   MRSA by PCR NEGATIVE NEGATIVE Final    Comment:        The GeneXpert MRSA Assay (FDA approved for NASAL specimens only), is one component of a comprehensive MRSA colonization surveillance program. It is not intended to diagnose MRSA infection nor to guide or monitor treatment for MRSA infections.     RADIOLOGY STUDIES/RESULTS: Dg Chest 1 View  Result Date: 01/05/2016 CLINICAL DATA:  Post right thoracentesis. EXAM: CHEST 1 VIEW COMPARISON:  01/05/2016 FINDINGS: Moderate right pleural effusion with right lower lobe atelectasis or infiltrate. No pneumothorax following thoracentesis. Mild cardiomegaly. Left AICD is unchanged. Left lung is clear. IMPRESSION: Moderate right pleural effusion with right lower lobe atelectasis or infiltrate. No pneumothorax. Electronically Signed   By: Charlett Nose M.D.    On: 01/05/2016 10:38   Dg Chest 2 View  Result Date: 01/06/2016 CLINICAL DATA:  Three weeks of coughing and chest pain. History of CHF, community-acquired pneumonia, pleural effusions, and coronary artery disease. The patient has a permanent pacemaker defibrillator. EXAM: CHEST  2 VIEW COMPARISON:  Portable chest x-ray of January 05, 2016 FINDINGS: There remains increased density in the mid and lower right lung compatible with pleural fluid layering posteriorly. There is a tiny left pleural effusion. The left lung is well-expanded. There are coarse lung markings posteriorly in the right lower lung. The interstitial markings are increased bilaterally. The cardiac silhouette is enlarged. The pulmonary vascularity is engorged. The ICD is in stable position. There is calcification in the wall of the aortic arch. IMPRESSION: There is a moderate-sized right pleural effusion and trace left pleural effusion. There is pulmonary interstitial edema secondary to CHF. There may be right basilar atelectasis or pneumonia. There has been slight interval deterioration in the appearance of the pulmonary interstitium bilaterally since yesterday's study. Thoracic aortic atherosclerosis. Electronically Signed   By: David  [place] M.D.   On: 01/06/2016 08:05   Dg Chest 2 View  Result Date: 01/05/2016 CLINICAL DATA:  Chest pain, cough. History of diabetes, cardiomyopathy/CHF. History of RIGHT thoracentesis. EXAM: CHEST  2 VIEW COMPARISON:  Chest radiograph January 07, 2015 and CT chest January 02, 2015 FINDINGS: Moderate to large RIGHT pleural effusion and underlying consolidation. Mild bronchitic changes. RIGHT heart border obscured. Mildly calcified aortic knob. Single lead LEFT cardiac pacemaker in situ. No pneumothorax. Old RIGHT lateral rib fractures. IMPRESSION: Moderate to large recurrent RIGHT pleural effusion with underlying consolidation, recommend follow up to exclude mass. Mild bronchitic changes. Electronically Signed    By: Michel Santee.D.  On: 01/05/2016 02:04   Ct Chest W Contrast  Result Date: 01/05/2016 CLINICAL DATA:  Sudden onset of left-sided chest pain EXAM: CT CHEST WITH CONTRAST TECHNIQUE: Multidetector CT imaging of the chest was performed during intravenous contrast administration. CONTRAST:  75mL ISOVUE-300 IOPAMIDOL (ISOVUE-300) INJECTION 61% COMPARISON:  Chest radiograph 01/05/2016, CT chest 01/02/2015 FINDINGS: Cardiovascular: Atherosclerotic calcifications within the aorta. No aneurysm. Extensive coronary artery calcifications. Heart size upper normal. Partially visualized cardiac pacing lead. No large pericardial effusion. Mediastinum/Nodes: Stable nonspecific mediastinal lymph nodes. Trachea is midline. Thyroid gland unremarkable. Esophagus is within normal limits. Lungs/Pleura: Large right-sided pleural effusion with consolidation in the right lower lobe and partial consolidation in the right middle lobe. The left lung demonstrates scattered foci of hazy density in the lingula and left lower lobe which may reflect diffuse atelectasis or possibly mild edema. No pneumothorax. No left effusion. Upper Abdomen: Upper abdominal structures show no acute abnormalities. Ascites adjacent to the spleen and liver. Musculoskeletal: No suspicious bone lesions. Old right-sided rib fractures IMPRESSION: 1. Large right-sided pleural effusion with consolidation in the right lower lobe. Partial consolidation in the right middle lobe, cannot rule out a pneumonia. No significant left pleural effusion. Scattered mild densities within the left upper and lower lobes could reflect scattered foci of atelectasis or mild edema. 2. Ascites in the upper abdomen 3. No change in mild mediastinal adenopathy. Electronically Signed   By: Jasmine PangKim  Fujinaga M.D.   On: 01/05/2016 03:44   Dg Chest Port 1 View  Result Date: 01/10/2016 CLINICAL DATA:  Pneumonia. EXAM: PORTABLE CHEST 1 VIEW COMPARISON:  01/06/2016. FINDINGS: Right PICC line  its tip noted at the cavoatrial junction. Cardiac pacer stable position. Stable cardiomegaly . Diffuse right lung infiltrate noted consistent with pneumonia prominent right pleural effusion. No pneumothorax. IMPRESSION: 1.  Right PICC line tip noted at the cavoatrial junction. 2. Prominent diffuse right lung infiltrate consistent with pneumonia. Right pleural effusion. 3.  Cardiac pacer stable position.  Persistent cardiomegaly. Electronically Signed   By: Maisie Fushomas  Register   On: 01/10/2016 06:54   [place] Thoracentesis Asp Pleural Space W/img Guide  Result Date: 01/05/2016 INDICATION: Congestive heart failure. History of right pleural effusion one year ago. Right chest pain. Large right pleural effusion. Request for diagnostic and therapeutic thoracentesis. EXAM: ULTRASOUND GUIDED RIGHT THORACENTESIS MEDICATIONS: 1% Lidocaine = 10 ml. COMPLICATIONS: None immediate. PROCEDURE: An ultrasound guided thoracentesis was thoroughly discussed with the patient and questions answered. The benefits, risks, alternatives and complications were also discussed. The patient understands and wishes to proceed with the procedure. Written consent was obtained. Ultrasound was performed to localize and mark an adequate pocket of fluid in the right chest. The area was then prepped and draped in the normal sterile fashion. 1% Lidocaine was used for local anesthesia. Under ultrasound guidance a 6 Fr Safe-T-Centesis catheter was introduced. Thoracentesis was performed. The catheter was removed and a dressing applied. FINDINGS: A total of approximately 2 liters of clear yellow fluid was removed. Samples were sent to the laboratory as requested by the clinical team. IMPRESSION: Successful ultrasound guided right thoracentesis yielding 2 liters of pleural fluid. Post procedure ultrasound revealed remaining moderate effusion. Recommend repeat thoracentesis tomorrow. Read by:  Corrin ParkerWendy Blair, PA-C Electronically Signed   By: Judie PetitM.  Shick M.D.   On:  01/05/2016 10:23     LOS: 5 days   Leroy SeaSINGH,Nakyiah Kuck K, MD  [facility] Pager:[phone]  If 7PM-7AM, please contact night-coverage [email or web address] Password [email or web address] 01/10/2016, 10:26 AM

## 2016-01-11 LAB — CBC
HCT: 38.1 % — ABNORMAL LOW (ref 39.0–52.0)
Hemoglobin: 12.6 g/dL — ABNORMAL LOW (ref 13.0–17.0)
MCH: 27.9 pg (ref 26.0–34.0)
MCHC: 33.1 g/dL (ref 30.0–36.0)
MCV: 84.3 fL (ref 78.0–100.0)
Platelets: 119 10*3/uL — ABNORMAL LOW (ref 150–400)
RBC: 4.52 MIL/uL (ref 4.22–5.81)
RDW: 18.4 % — AB (ref 11.5–15.5)
WBC: 4.9 10*3/uL (ref 4.0–10.5)

## 2016-01-11 LAB — BASIC METABOLIC PANEL
Anion gap: 7 (ref 5–15)
BUN: 20 mg/dL (ref 6–20)
CHLORIDE: 92 mmol/L — AB (ref 101–111)
CO2: 32 mmol/L (ref 22–32)
Calcium: 8.6 mg/dL — ABNORMAL LOW (ref 8.9–10.3)
Creatinine, Ser: 0.91 mg/dL (ref 0.61–1.24)
GFR calc Af Amer: >60 mL/min (ref >60)
GFR calc non Af Amer: >60 mL/min (ref >60)
Glucose, Bld: 234 mg/dL — ABNORMAL HIGH (ref 65–99)
Potassium: 4.2 mmol/L (ref 3.5–5.1)
Sodium: 131 mmol/L — ABNORMAL LOW (ref 135–145)

## 2016-01-11 LAB — COOXEMETRY PANEL
Carboxyhemoglobin: 1.5 % (ref 0.5–1.5)
Methemoglobin: 1 % (ref 0.0–1.5)
O2 Saturation: 62.5 %
Total hemoglobin: 12.7 g/dL (ref 12.0–16.0)

## 2016-01-11 LAB — GLUCOSE, CAPILLARY
Glucose-Capillary: 193 mg/dL — ABNORMAL HIGH (ref 65–99)
Glucose-Capillary: 232 mg/dL — ABNORMAL HIGH (ref 65–99)

## 2016-01-11 LAB — MAGNESIUM: Magnesium: 2 mg/dL (ref 1.7–2.4)

## 2016-01-11 MED ORDER — LOSARTAN POTASSIUM 25 MG PO TABS: 12.5000 mg | ORAL_TABLET | Freq: Two times a day (BID) | ORAL | 0 refills | Status: DC

## 2016-01-11 MED ORDER — POTASSIUM CHLORIDE CRYS ER 20 MEQ PO TBCR: 20.0000 meq | EXTENDED_RELEASE_TABLET | Freq: Every day | ORAL | 0 refills | Status: DC

## 2016-01-11 MED ORDER — DIGOXIN 125 MCG PO TABS: 0.1250 mg | ORAL_TABLET | Freq: Every day | ORAL | 0 refills | Status: DC

## 2016-01-11 MED ORDER — TORSEMIDE 20 MG PO TABS: 80.0000 mg | ORAL_TABLET | Freq: Every day | ORAL | 0 refills | Status: DC

## 2016-01-11 MED ORDER — SPIRONOLACTONE 25 MG PO TABS: 25.0000 mg | ORAL_TABLET | Freq: Every day | ORAL | 0 refills | Status: DC

## 2016-01-11 NOTE — Discharge Summary (Signed)
Dalton [name] D [name] WGN:[id number] DOB: 12/06/1955 DOA: 01/05/2016  PCP: Dalton LabellaMILLER,[name], Dalton [name]  Admit date: 01/05/2016  Discharge date: 01/11/2016  Admitted From: home   Disposition:  Home   Recommendations for Outpatient Follow-up:   Follow up with PCP in 1-2 weeks  PCP Please obtain BMP/CBC, 2 view CXR in 1week,  (see Discharge instructions)   PCP Please follow up on the following pending results: All BMP, weight and diuretic dose closely   Home Health: None   Equipment/Devices: None Consultations: Cards Discharge Condition: Fair   CODE STATUS: Full   Diet Recommendation: Diet heart healthy/carb modified , 1.5lit/day fluid restriction   Chief Complaint  Patient presents with  . Chest Pain  . Cough     Brief history of present illness from the day of admission and additional interim summary    Patient is a 61 y.o. male with hx of ischemic cardiomyopathy (last EF 20-25 % on 01/03/15) presented with worsening SOB and left sided chest pain. Found to have acute on chronic systolic CHF and moderate pleural effusion-underwent thoracontesis on 1/4. See below for details.    Hospital issues addressed     Acute Hypoxemic Resp Failure: multifactorial etiology-due to acute on chronic systolic CHF, ? CAP, pleural effusion. Continue diuresis, taper down antibiotics, continue supportive care. Taper off O2 as tolerated, clinically better.  Acute on Chronic Systolic CHF (last EF 20-25 % on 01/03/15) due to ischemic cardiomyopathy: Clinically much better, has been diuresed well, dobutamine drip to be stopped on 01/10/2016 by cardiology, continue diuresis by Demadex, continue oral digoxin, & ARB will defer management to cardiology, per Dr Shirlee LatchMcLean DC home, Seen by cardiac thoracic surgery for now Medical management only, has [employer]  AICD.  Right Pleural Effusion: Exudative by light's criteria, probably parapneumonic effusion. Pleural fluid cultures negative so far. Even though this is a exudative pleural effusion, given ascites-and significant volume overload-suspect some of it is due to CHF. Pleural fluid cytology negative. So far 14.4 L negative.  CAP: He has finished treatment with Levaquin, symptom-free, nonproductive cough, no oxygen need, back to baseline.   Hx of CAD (MI 2005 LAD stent W/ BMS CFX, DES x 2 RCA): continue ASA and statin for secondary prevention, no Coreg for now per cardiology as per pressure was low, chest pain-free, severe three-vessel disease per left heart cath however per cardiac thoracic surgery which. Defer medical management to cardiology in the outpt setting.  Hx of PVD-s/p L fem-pop 11/2014:continue ASA/Statin  Dyslipidemia:Continue Lipitor and Zetia  Tobacco Abuse: counseled to quit.  Hypokalemia and hypomagnesemia. ReplacedRequests BCP to monitor closely.   DM-2: Tinea home regimen and follow glycemic control with PCP   Discharge diagnosis     Principal Problem:   Acute on chronic systolic heart failure (HCC) Active Problems:   Hyperlipidemia   TOBACCO ABUSE   Coronary atherosclerosis   Cerebrovascular disease   Peripheral vascular disease (HCC)   Hypertension   Automatic implantable cardioverter-defibrillator in [employer]   Carotid stenosis   Type 2 diabetes  mellitus (HCC)   Pleural effusion   Chronic systolic CHF (congestive heart failure) (HCC)   Community acquired pneumonia   Ischemic cardiomyopathy    Discharge instructions    Discharge Instructions    Discharge instructions    Complete by:  As directed    Follow with Primary Dalton [name] Dalton [name], Dalton [name] in 7 days   Get CBC, CMP, 2 view Chest X ray checked  by Primary Dalton [name] or SNF Dalton [name] in 5-7 days ( we routinely change or add medications that can affect your baseline labs and fluid status, therefore we recommend  that you get the mentioned basic workup next visit with your PCP, your PCP may decide not to get them or add new tests based on their clinical decision)   Activity: As tolerated with Full fall precautions use walker/cane & assistance as needed   Disposition Home     Diet:   Diet heart healthy/carb modified  Check your Weight same time everyday, if you gain over 2 pounds, or you develop in leg swelling, experience more shortness of breath or chest pain, call your Primary Dalton [name] immediately. Follow Cardiac Low Salt Diet and 1.5 lit/day fluid restriction.   On your next visit with your primary care physician please Get Medicines reviewed and adjusted.   Please request your Prim.Dalton [name] to go over all Hospital Tests and Procedure/Radiological results at the follow up, please get all Hospital records sent to your Prim Dalton [name] by signing hospital release before you go home.   If you experience worsening of your admission symptoms, develop shortness of breath, life threatening emergency, suicidal or homicidal thoughts you must seek medical attention immediately by calling 911 or calling your Dalton [name] immediately  if symptoms less severe.  You Must read complete instructions/literature along with all the possible adverse reactions/side effects for all the Medicines you take and that have been prescribed to you. Take any new Medicines after you have completely understood and accpet all the possible adverse reactions/side effects.   Do not drive, operate heavy machinery, perform activities at heights, swimming or participation in water activities or provide baby sitting services if your were admitted for syncope or siezures until you have seen by Primary Dalton [name] or a Neurologist and advised to do so again.  Do not drive when taking Pain medications.    Do not take more than prescribed Pain, Sleep and Anxiety Medications  Special Instructions: If you have smoked or chewed Tobacco  in the last 2 yrs please stop smoking,  stop any regular Alcohol  and or any Recreational drug use.  Wear Seat belts while driving.   Please note  You were cared for by a hospitalist during your hospital stay. If you have any questions about your discharge medications or the care you received while you were in the hospital after you are discharged, you can call the unit and asked to speak with the hospitalist on call if the hospitalist that took care of you is not available. Once you are discharged, your primary care physician will handle any further medical issues. Please note that NO REFILLS for any discharge medications will be authorized once you are discharged, as it is imperative that you return to your primary care physician (or establish a relationship with a primary care physician if you do not have one) for your aftercare needs so that they can reassess your need for medications and monitor your lab values.   Increase activity slowly    Complete by:  As directed  Discharge Medications   Allergies as of 01/11/2016      Reactions   Niacin Itching, Other (See Comments)   Flushing    Metformin And Related Nausea And Vomiting   *Only the extended release*      Medication List    STOP taking these medications   carvedilol 6.25 MG tablet Commonly known as:  COREG   sacubitril-valsartan 24-26 MG Commonly known as:  ENTRESTO     TAKE these medications   aspirin 81 MG tablet Take 1 tablet (81 mg total) by mouth daily.   atorvastatin 80 MG tablet Commonly known as:  LIPITOR Take 1 tablet (80 mg total) by mouth every other day.   digoxin 0.125 MG tablet Commonly known as:  LANOXIN Take 1 tablet (0.125 mg total) by mouth daily. Start taking on:  01/12/2016   ezetimibe 10 MG tablet Commonly known as:  ZETIA TAKE ONE TABLET BY MOUTH ONCE DAILY   insulin NPH-regular Human (70-30) 100 UNIT/ML injection Commonly known as:  NOVOLIN 70/30 Inject 16-32 Units into the skin 2 (two) times daily with a meal. 32 units  in the morning, 16 units in the evening   losartan 25 MG tablet Commonly known as:  COZAAR Take 0.5 tablets (12.5 mg total) by mouth 2 (two) times daily.   potassium chloride SA 20 MEQ tablet Commonly known as:  K-DUR,KLOR-CON Take 1 tablet (20 mEq total) by mouth daily. Start taking on:  01/12/2016   spironolactone 25 MG tablet Commonly known as:  ALDACTONE Take 1 tablet (25 mg total) by mouth daily. Start taking on:  01/12/2016 What changed:  how much to take   torsemide 20 MG tablet Commonly known as:  DEMADEX Take 4 tablets (80 mg total) by mouth daily. Start taking on:  01/12/2016 What changed:  See the new instructions.       Follow-up Information    Marca Ancona, Dalton [name] Follow up on 01/18/2016.   Specialty:  Cardiology Why:  at 10:40 Garage Code 5000  Contact information: [address] [place] [address] [phone]        Dalton [name], Dalton [name]. Schedule an appointment as soon as possible for a visit in 1 week(s).   Specialty:  Family Medicine Contact information: [address] [place] [address] [phone]           Major procedures and Radiology Reports - PLEASE review detailed and final reports thoroughly  -     TEE - Compared to a prior study in 01/2015, the LVEF is unchanged at around 20% with severe global hypokinesis.  Thoracocentesis by IR 1/4>> borderline exudate  LHC/RHC 1/5>> 1. Elevated left and right heart filling pressures with high CVP/PCWP ratio.  2. Pulmonary venous hypertension.  3. Cardiac output acceptable on 2.5 dobutamine.  4. Extensive 3 vessel CAD with suspected hemodynamically significant stenoses in the proximal LAD, distal RCA, and OM1.    Dg Chest 1 View  Result Date: 01/05/2016 CLINICAL DATA:  Post right thoracentesis. EXAM: CHEST 1 VIEW COMPARISON:  01/05/2016 FINDINGS: Moderate right pleural effusion with right lower lobe atelectasis or infiltrate. No pneumothorax following thoracentesis.  Mild cardiomegaly. Left AICD is unchanged. Left lung is clear. IMPRESSION: Moderate right pleural effusion with right lower lobe atelectasis or infiltrate. No pneumothorax. Electronically Signed   By: Charlett Nose M.D.   On: 01/05/2016 10:38   Dg Chest 2 View  Result Date: 01/06/2016 CLINICAL DATA:  Three weeks of coughing and chest pain. History of  CHF, community-acquired pneumonia, pleural effusions, and coronary artery disease. The patient has a permanent pacemaker defibrillator. EXAM: CHEST  2 VIEW COMPARISON:  Portable chest x-ray of January 05, 2016 FINDINGS: There remains increased density in the mid and lower right lung compatible with pleural fluid layering posteriorly. There is a tiny left pleural effusion. The left lung is well-expanded. There are coarse lung markings posteriorly in the right lower lung. The interstitial markings are increased bilaterally. The cardiac silhouette is enlarged. The pulmonary vascularity is engorged. The ICD is in stable position. There is calcification in the wall of the aortic arch. IMPRESSION: There is a moderate-sized right pleural effusion and trace left pleural effusion. There is pulmonary interstitial edema secondary to CHF. There may be right basilar atelectasis or pneumonia. There has been slight interval deterioration in the appearance of the pulmonary interstitium bilaterally since yesterday's study. Thoracic aortic atherosclerosis. Electronically Signed   By: David  [place] M.D.   On: 01/06/2016 08:05   Dg Chest 2 View  Result Date: 01/05/2016 CLINICAL DATA:  Chest pain, cough. History of diabetes, cardiomyopathy/CHF. History of RIGHT thoracentesis. EXAM: CHEST  2 VIEW COMPARISON:  Chest radiograph January 07, 2015 and CT chest January 02, 2015 FINDINGS: Moderate to large RIGHT pleural effusion and underlying consolidation. Mild bronchitic changes. RIGHT heart border obscured. Mildly calcified aortic knob. Single lead LEFT cardiac pacemaker in situ. No  pneumothorax. Old RIGHT lateral rib fractures. IMPRESSION: Moderate to large recurrent RIGHT pleural effusion with underlying consolidation, recommend follow up to exclude mass. Mild bronchitic changes. Electronically Signed   By: Awilda Metro M.D.   On: 01/05/2016 02:04   Ct Chest W Contrast  Result Date: 01/05/2016 CLINICAL DATA:  Sudden onset of left-sided chest pain EXAM: CT CHEST WITH CONTRAST TECHNIQUE: Multidetector CT imaging of the chest was performed during intravenous contrast administration. CONTRAST:  75mL ISOVUE-300 IOPAMIDOL (ISOVUE-300) INJECTION 61% COMPARISON:  Chest radiograph 01/05/2016, CT chest 01/02/2015 FINDINGS: Cardiovascular: Atherosclerotic calcifications within the aorta. No aneurysm. Extensive coronary artery calcifications. Heart size upper normal. Partially visualized cardiac pacing lead. No large pericardial effusion. Mediastinum/Nodes: Stable nonspecific mediastinal lymph nodes. Trachea is midline. Thyroid gland unremarkable. Esophagus is within normal limits. Lungs/Pleura: Large right-sided pleural effusion with consolidation in the right lower lobe and partial consolidation in the right middle lobe. The left lung demonstrates scattered foci of hazy density in the lingula and left lower lobe which may reflect diffuse atelectasis or possibly mild edema. No pneumothorax. No left effusion. Upper Abdomen: Upper abdominal structures show no acute abnormalities. Ascites adjacent to the spleen and liver. Musculoskeletal: No suspicious bone lesions. Old right-sided rib fractures IMPRESSION: 1. Large right-sided pleural effusion with consolidation in the right lower lobe. Partial consolidation in the right middle lobe, cannot rule out a pneumonia. No significant left pleural effusion. Scattered mild densities within the left upper and lower lobes could reflect scattered foci of atelectasis or mild edema. 2. Ascites in the upper abdomen 3. No change in mild mediastinal adenopathy.  Electronically Signed   By: Jasmine Pang M.D.   On: 01/05/2016 03:44   Dg Chest Port 1 View  Result Date: 01/10/2016 CLINICAL DATA:  Pneumonia. EXAM: PORTABLE CHEST 1 VIEW COMPARISON:  01/06/2016. FINDINGS: Right PICC line its tip noted at the cavoatrial junction. Cardiac pacer stable position. Stable cardiomegaly . Diffuse right lung infiltrate noted consistent with pneumonia prominent right pleural effusion. No pneumothorax. IMPRESSION: 1.  Right PICC line tip noted at the cavoatrial junction. 2. Prominent diffuse right lung infiltrate consistent  with pneumonia. Right pleural effusion. 3.  Cardiac pacer stable position.  Persistent cardiomegaly. Electronically Signed   By: Maisie Fus  Register   On: 01/10/2016 06:54   US Thoracentesis Asp Pleural Space W/img Guide  Result Date: 01/05/2016 INDICATION: Congestive heart failure. History of right pleural effusion one year ago. Right chest pain. Large right pleural effusion. Request for diagnostic and therapeutic thoracentesis. EXAM: ULTRASOUND GUIDED RIGHT THORACENTESIS MEDICATIONS: 1% Lidocaine = 10 ml. COMPLICATIONS: None immediate. PROCEDURE: An ultrasound guided thoracentesis was thoroughly discussed with the patient and questions answered. The benefits, risks, alternatives and complications were also discussed. The patient understands and wishes to proceed with the procedure. Written consent was obtained. Ultrasound was performed to localize and mark an adequate pocket of fluid in the right chest. The area was then prepped and draped in the normal sterile fashion. 1% Lidocaine was used for local anesthesia. Under ultrasound guidance a 6 Fr Safe-T-Centesis catheter was introduced. Thoracentesis was performed. The catheter was removed and a dressing applied. FINDINGS: A total of approximately 2 liters of clear yellow fluid was removed. Samples were sent to the laboratory as requested by the clinical team. IMPRESSION: Successful ultrasound guided right  thoracentesis yielding 2 liters of pleural fluid. Post procedure ultrasound revealed remaining moderate effusion. Recommend repeat thoracentesis tomorrow. Read by:  Corrin Parker, PA-C Electronically Signed   By: Judie Petit.  Shick M.D.   On: 01/05/2016 10:23    Micro Results     Recent Results (from the past 240 hour(s))  Culture, blood (routine x 2) Call Dalton [name] if unable to obtain prior to antibiotics being given     Status: None   Collection Time: 01/05/16  1:25 AM  Result Value Ref Range Status   Specimen Description BLOOD RIGHT ANTECUBITAL  Final   Special Requests BOTTLES DRAWN AEROBIC AND ANAEROBIC 5CC  Final   Culture NO GROWTH 5 DAYS  Final   Report Status 01/10/2016 FINAL  Final  Culture, blood (routine x 2) Call Dalton [name] if unable to obtain prior to antibiotics being given     Status: None   Collection Time: 01/05/16  8:29 AM  Result Value Ref Range Status   Specimen Description BLOOD RIGHT ANTECUBITAL  Final   Special Requests BOTTLES DRAWN AEROBIC AND ANAEROBIC  5CC  Final   Culture NO GROWTH 5 DAYS  Final   Report Status 01/10/2016 FINAL  Final  Culture, body fluid-bottle     Status: None   Collection Time: 01/05/16 10:24 AM  Result Value Ref Range Status   Specimen Description FLUID PERITONEAL  Final   Special Requests NONE  Final   Culture NO GROWTH 5 DAYS  Final   Report Status 01/10/2016 FINAL  Final  Gram stain     Status: None   Collection Time: 01/05/16 10:24 AM  Result Value Ref Range Status   Specimen Description FLUID PERITONEAL  Final   Special Requests NONE  Final   Gram Stain   Final    RARE WBC PRESENT, PREDOMINANTLY MONONUCLEAR NO ORGANISMS SEEN    Report Status 01/05/2016 FINAL  Final  Culture, sputum-assessment     Status: None   Collection Time: 01/05/16  9:09 PM  Result Value Ref Range Status   Specimen Description SPUTUM  Final   Special Requests NONE  Final   Sputum evaluation THIS SPECIMEN IS ACCEPTABLE FOR SPUTUM CULTURE  Final   Report Status 01/06/2016  FINAL  Final  Culture, respiratory (NON-Expectorated)     Status: None   Collection Time:  01/05/16  9:09 PM  Result Value Ref Range Status   Specimen Description SPUTUM  Final   Special Requests NONE Reflexed from [id number]  Final   Gram Stain   Final    ABUNDANT WBC PRESENT, PREDOMINANTLY PMN ABUNDANT GRAM POSITIVE COCCI IN CLUSTERS IN PAIRS FEW GRAM NEGATIVE RODS FEW GRAM POSITIVE RODS    Culture Consistent with normal respiratory flora.  Final   Report Status 01/08/2016 FINAL  Final  MRSA PCR Screening     Status: None   Collection Time: 01/06/16  8:21 PM  Result Value Ref Range Status   MRSA by PCR NEGATIVE NEGATIVE Final    Comment:        The GeneXpert MRSA Assay (FDA approved for NASAL specimens only), is one component of a comprehensive MRSA colonization surveillance program. It is not intended to diagnose MRSA infection nor to guide or monitor treatment for MRSA infections.     Today   Subjective    Dalton [name] today has no headache,no chest abdominal pain,no new weakness tingling or numbness, feels much better wants to go home today.     Objective   Blood pressure 106/60, pulse 82, temperature 97.9 F (36.6 C), temperature source Oral, resp. rate (!) 21, height 6' (1.829 m), weight 97.3 kg (214 lb 8.1 oz), SpO2 95 %.   Intake/Output Summary (Last 24 hours) at 01/11/16 1042 Last data filed at 01/11/16 0800  Gross per 24 hour  Intake             1400 ml  Output             1850 ml  Net             -450 ml    Exam Awake Alert, Oriented x 3, No new F.N deficits, Normal affect [place].AT,PERRAL Supple Neck,No JVD, No cervical lymphadenopathy appriciated.  Symmetrical Chest wall movement, Good air movement bilaterally, CTAB RRR,No Gallops,Rubs or new Murmurs, No Parasternal Heave +ve B.Sounds, Abd Soft, Non tender, No organomegaly appriciated, No rebound -guarding or rigidity. No Cyanosis, Clubbing or edema, No new Rash or bruise   Data Review   CBC w Diff:  Lab Results  Component Value Date   WBC 4.9 01/11/2016   HGB 12.6 (L) 01/11/2016   HCT 38.1 (L) 01/11/2016   PLT 119 (L) 01/11/2016   LYMPHOPCT 10 01/08/2016   MONOPCT 12 01/08/2016   EOSPCT 1 01/08/2016   BASOPCT 0 01/08/2016    CMP: Lab Results  Component Value Date   NA 131 (L) 01/11/2016   K 4.2 01/11/2016   CL 92 (L) 01/11/2016   CO2 32 01/11/2016   BUN 20 01/11/2016   CREATININE 0.91 01/11/2016   CREATININE 0.77 04/05/2015   PROT 7.1 01/06/2016   ALBUMIN 2.9 (L) 01/06/2016   BILITOT 1.4 (H) 01/06/2016   ALKPHOS 108 01/06/2016   AST 27 01/06/2016   ALT 15 (L) 01/06/2016  .   Total Time in preparing paper work, data evaluation and todays exam - 35 minutes  Leroy Sea M.D on 01/11/2016 at 10:42 AM  [facility]   Office  [phone]

## 2016-01-11 NOTE — Progress Notes (Addendum)
Patient ID: [name], male   DOB: 08-Oct-1955, 61 y.o.   MRN: [id number]    SUBJECTIVE:   Underwent cath 1/5 with severe 3vCAD and elevated filling pressures. Seen by Dr. PVT and not felt to be CABG candidate due to poor cardiac reserve, lack of saphenous vein conduits and other co-morbidities.  Now off dobutamine, co-ox 63%.    Weight stable.  Walked yesterday off oxygen.  Says breathing is ok.    L/R heart cath 01/07/16: Severe CAD LM: ok LAD: long 80% calcified lesion LCX: total OM1 RCA:  Distal 80% at trifurcation RA mean 21 RV 69/24 PA 69/31, mean 45 PCWP mean 27 Cardiac Output (Fick) 5.36 (on dobutamine 2.5) Cardiac Index (Fick) 2.37   Echo (1/18): EF 20%, global hypokinesis, very mildly decreased RV systolic function.   Status post thoracentesis: Exudate by Light's criteria (total protein).  Fluid cultures negative so far.  Scheduled Meds: . aspirin EC  81 mg Oral Daily  . atorvastatin  80 mg Oral QODAY  . digoxin  0.125 mg Oral Daily  . ezetimibe  10 mg Oral Daily  . heparin  5,000 Units Subcutaneous Q8H  . insulin aspart  0-9 Units Subcutaneous TID WC  . insulin glargine  12 Units Subcutaneous QHS  . levofloxacin (LEVAQUIN) IV  750 mg Intravenous Q24H  . losartan  12.5 mg Oral BID  . potassium chloride SA  40 mEq Oral Daily  . sodium chloride flush  3 mL Intravenous Q12H  . spironolactone  25 mg Oral Daily  . torsemide  80 mg Oral Daily   Continuous Infusions:  PRN Meds:.sodium chloride, acetaminophen, guaiFENesin-dextromethorphan, ipratropium-albuterol, ondansetron (ZOFRAN) IV    Vitals:   01/10/16 2300 01/11/16 0000 01/11/16 0300 01/11/16 0500  BP: (!) 109/57 (!) 104/52 (!) 101/55   Pulse: 90  70   Resp: (!) 22  18   Temp: 98.3 F (36.8 C)  97.7 F (36.5 C)   TempSrc: Oral  Oral   SpO2: 94% 94% 92%   Weight:    214 lb 8.1 oz (97.3 kg)  Height:        Intake/Output Summary (Last 24 hours) at 01/11/16 0740 Last data filed at 01/11/16 0000  Gross per 24 hour  Intake             1160 ml  Output             1850 ml  Net             -690 ml    LABS: Basic Metabolic Panel:  Recent Labs  40/98/11 0424 01/11/16 0420  NA 132* 131*  K 4.1 4.2  CL 93* 92*  CO2 31 32  GLUCOSE 190* 234*  BUN 13 20  CREATININE 0.86 0.91  CALCIUM 8.3* 8.6*  MG 2.0 2.0   Liver Function Tests: No results for input(s): AST, ALT, ALKPHOS, BILITOT, PROT, ALBUMIN in the last 72 hours. No results for input(s): LIPASE, AMYLASE in the last 72 hours. CBC:  Recent Labs  01/08/16 0825 01/09/16 0434 01/11/16 0420  WBC 5.3 4.9 4.9  NEUTROABS 4.1  --   --   HGB 12.8* 12.7* 12.6*  HCT 39.1 39.8 38.1*  MCV 85.7 85.6 84.3  PLT 105* 113* 119*   Cardiac Enzymes: No results for input(s): CKTOTAL, CKMB, CKMBINDEX, TROPONINI in the last 72 hours. BNP: Invalid input(s): POCBNP D-Dimer: No results for input(s): DDIMER in the last 72 hours. Hemoglobin A1C: No results for input(s): HGBA1C in  the last 72 hours. Fasting Lipid Panel: No results for input(s): CHOL, HDL, LDLCALC, TRIG, CHOLHDL, LDLDIRECT in the last 72 hours. Thyroid Function Tests: No results for input(s): TSH, T4TOTAL, T3FREE, THYROIDAB in the last 72 hours.  Invalid input(s): FREET3 Anemia Panel: No results for input(s): VITAMINB12, FOLATE, FERRITIN, TIBC, IRON, RETICCTPCT in the last 72 hours.  RADIOLOGY: Dg Chest 1 View  Result Date: 01/05/2016 CLINICAL DATA:  Post right thoracentesis. EXAM: CHEST 1 VIEW COMPARISON:  01/05/2016 FINDINGS: Moderate right pleural effusion with right lower lobe atelectasis or infiltrate. No pneumothorax following thoracentesis. Mild cardiomegaly. Left AICD is unchanged. Left lung is clear. IMPRESSION: Moderate right pleural effusion with right lower lobe atelectasis or infiltrate. No pneumothorax. Electronically Signed   By: Charlett Nose M.D.   On: 01/05/2016 10:38   Dg Chest 2 View  Result Date: 01/06/2016 CLINICAL DATA:  Three weeks of coughing and  chest pain. History of CHF, community-acquired pneumonia, pleural effusions, and coronary artery disease. The patient has a permanent pacemaker defibrillator. EXAM: CHEST  2 VIEW COMPARISON:  Portable chest x-ray of January 05, 2016 FINDINGS: There remains increased density in the mid and lower right lung compatible with pleural fluid layering posteriorly. There is a tiny left pleural effusion. The left lung is well-expanded. There are coarse lung markings posteriorly in the right lower lung. The interstitial markings are increased bilaterally. The cardiac silhouette is enlarged. The pulmonary vascularity is engorged. The ICD is in stable position. There is calcification in the wall of the aortic arch. IMPRESSION: There is a moderate-sized right pleural effusion and trace left pleural effusion. There is pulmonary interstitial edema secondary to CHF. There may be right basilar atelectasis or pneumonia. There has been slight interval deterioration in the appearance of the pulmonary interstitium bilaterally since yesterday's study. Thoracic aortic atherosclerosis. Electronically Signed   By: David  [place] M.D.   On: 01/06/2016 08:05   Dg Chest 2 View  Result Date: 01/05/2016 CLINICAL DATA:  Chest pain, cough. History of diabetes, cardiomyopathy/CHF. History of RIGHT thoracentesis. EXAM: CHEST  2 VIEW COMPARISON:  Chest radiograph January 07, 2015 and CT chest January 02, 2015 FINDINGS: Moderate to large RIGHT pleural effusion and underlying consolidation. Mild bronchitic changes. RIGHT heart border obscured. Mildly calcified aortic knob. Single lead LEFT cardiac pacemaker in situ. No pneumothorax. Old RIGHT lateral rib fractures. IMPRESSION: Moderate to large recurrent RIGHT pleural effusion with underlying consolidation, recommend follow up to exclude mass. Mild bronchitic changes. Electronically Signed   By: Awilda Metro M.D.   On: 01/05/2016 02:04   Ct Chest W Contrast  Result Date: 01/05/2016 CLINICAL  DATA:  Sudden onset of left-sided chest pain EXAM: CT CHEST WITH CONTRAST TECHNIQUE: Multidetector CT imaging of the chest was performed during intravenous contrast administration. CONTRAST:  75mL ISOVUE-300 IOPAMIDOL (ISOVUE-300) INJECTION 61% COMPARISON:  Chest radiograph 01/05/2016, CT chest 01/02/2015 FINDINGS: Cardiovascular: Atherosclerotic calcifications within the aorta. No aneurysm. Extensive coronary artery calcifications. Heart size upper normal. Partially visualized cardiac pacing lead. No large pericardial effusion. Mediastinum/Nodes: Stable nonspecific mediastinal lymph nodes. Trachea is midline. Thyroid gland unremarkable. Esophagus is within normal limits. Lungs/Pleura: Large right-sided pleural effusion with consolidation in the right lower lobe and partial consolidation in the right middle lobe. The left lung demonstrates scattered foci of hazy density in the lingula and left lower lobe which may reflect diffuse atelectasis or possibly mild edema. No pneumothorax. No left effusion. Upper Abdomen: Upper abdominal structures show no acute abnormalities. Ascites adjacent to the spleen and liver. Musculoskeletal:  No suspicious bone lesions. Old right-sided rib fractures IMPRESSION: 1. Large right-sided pleural effusion with consolidation in the right lower lobe. Partial consolidation in the right middle lobe, cannot rule out a pneumonia. No significant left pleural effusion. Scattered mild densities within the left upper and lower lobes could reflect scattered foci of atelectasis or mild edema. 2. Ascites in the upper abdomen 3. No change in mild mediastinal adenopathy. Electronically Signed   By: Jasmine Pang M.D.   On: 01/05/2016 03:44   Dg Chest Port 1 View  Result Date: 01/10/2016 CLINICAL DATA:  Pneumonia. EXAM: PORTABLE CHEST 1 VIEW COMPARISON:  01/06/2016. FINDINGS: Right PICC line its tip noted at the cavoatrial junction. Cardiac pacer stable position. Stable cardiomegaly . Diffuse right  lung infiltrate noted consistent with pneumonia prominent right pleural effusion. No pneumothorax. IMPRESSION: 1.  Right PICC line tip noted at the cavoatrial junction. 2. Prominent diffuse right lung infiltrate consistent with pneumonia. Right pleural effusion. 3.  Cardiac pacer stable position.  Persistent cardiomegaly. Electronically Signed   By: Maisie Fus  Register   On: 01/10/2016 06:54   US Thoracentesis Asp Pleural Space W/img Guide  Result Date: 01/05/2016 INDICATION: Congestive heart failure. History of right pleural effusion one year ago. Right chest pain. Large right pleural effusion. Request for diagnostic and therapeutic thoracentesis. EXAM: ULTRASOUND GUIDED RIGHT THORACENTESIS MEDICATIONS: 1% Lidocaine = 10 ml. COMPLICATIONS: None immediate. PROCEDURE: An ultrasound guided thoracentesis was thoroughly discussed with the patient and questions answered. The benefits, risks, alternatives and complications were also discussed. The patient understands and wishes to proceed with the procedure. Written consent was obtained. Ultrasound was performed to localize and mark an adequate pocket of fluid in the right chest. The area was then prepped and draped in the normal sterile fashion. 1% Lidocaine was used for local anesthesia. Under ultrasound guidance a 6 Fr Safe-T-Centesis catheter was introduced. Thoracentesis was performed. The catheter was removed and a dressing applied. FINDINGS: A total of approximately 2 liters of clear yellow fluid was removed. Samples were sent to the laboratory as requested by the clinical team. IMPRESSION: Successful ultrasound guided right thoracentesis yielding 2 liters of pleural fluid. Post procedure ultrasound revealed remaining moderate effusion. Recommend repeat thoracentesis tomorrow. Read by:  Corrin Parker, PA-C Electronically Signed   By: Judie Petit.  Shick M.D.   On: 01/05/2016 10:23    PHYSICAL EXAM General: NAD  Neck: JVP 7, no thyromegaly or thyroid nodule.  Lungs:  coarse bilaterally CV: Nondisplaced PMI.  Heart regular S1/S2, no S3/S4, 1/6 SEM RUSB.  No peripheral edema.  No carotid bruit.  Normal pedal pulses.  Abdomen: Soft, nontender, no hepatosplenomegaly, mild distention.  Neurologic: Alert and oriented x 3.  Psych: Flat affect. Somewhat oppostional Extremities: No clubbing or cyanosis. Severe chronic venous stasis changes lower legs.   TELEMETRY: Reviewed telemetry pt in NSR  ASSESSMENT AND PLAN: 61 yo with history of PAD (s/p fem-pop), HTN, DM, CAD, ischemic cardiomyopathy/chronic systolic CHF presented with gradual worsening of volume overload and dyspnea.  1. Acute on chronic systolic CHF: Echo 1/18 with EF 20% and very mildly decreased RV systolic function.  Ischemic cardiomyopathy, has [employer] ICD.  Cath results 1/6 as above with severe CAD and elevated filling pressures.  Responded well to IV diuresis and dobutamine. Now euvolemic. Co-ox looks good off dobutamine.  - Continue torsemide 80 mg daily (was on 60 daily at home).  - Continue digoxin and spironolactone - Continue losartan 12.5 mg bid.  - Turned down for CABG by Dr. Norina Buzzard  given comorbidities and lack of venous conduits.  - With soft BP, will not titrate meds any further.  Will try to restart Coreg at low dose as outpatient.  2. PNA: Right-sided PNA noted on chest CT.  He is on levofloxacin per primary service.  Afebrile.  3. Right pleural effusion: s/p thoracentesis Exudative by Light's criteria, parapneumonic.  4. CAD:  Significant 3 vessel disease. Turned down for CABG.  - Continue ASA, statin, Zetia.  - Cath films reviewed with Dr. Excell Seltzerooper.  All disease appears chronic.  RCA would be difficult to intervene upon and OM is a subtotal occlusion.  Think it would be reasonable to intervene upon proximal LAD, will likely bring back in a couple weeks after he is stabilized post-current admission for CHF and PNA => will set up PCI with Dr Excell Seltzerooper when I see him in followup.  5. Smoking:  Discussed cessation. 6. Disposition: Can go home today from cardiac standpoint.  He will need followup with me in about a week.  Cardiac meds for home: digoxin 0.125 daily, spironolactone 25 daily, losartan 12.5 bid, ASA 81, atorvastatin 80 daily, KCl 40 daily, Zetia 10 daily, torsemide 80 daily.    Nisha Dhami,MD 7:40 AM 01/11/2016

## 2016-01-11 NOTE — Progress Notes (Signed)
CARDIAC REHAB PHASE I   PRE:  Rate/Rhythm: 85 SR  BP:  Sitting: 120/60        SaO2: 95 RA  MODE:  Ambulation: 470 ft   POST:  Rate/Rhythm: 87 SR  BP:  Sitting: 119/68         SaO2: 100 RA  Pt ambulated 470 ft on RA, IV  (pt insisted on pushing IV, declined use of RW), assist x1, mostly steady gait, tolerated fairly well. Pt states he felt more steady today, c/o mild DOE, denies any other complaints, declined rest stop. Attempted to review CHF education with pt. Reviewed CHF booklet and zone tool, daily weights, low sodium diet, activity restrictions and phase 2 cardiac rehab (left brochure). Recommended light exercise as tolerated until pt returns for staged PCI, will send phase 2 cardiac rehab referral at that time. Pt verbalized understanding, very limited receptiveness to education. Pt states he will need a note for work if he is to be out for any period of time. Pt to edge of bed per pt request after walk, call bell within reach.   [phone] Joylene GrapesEmily C Jammal Sarr, RN, BSN 01/11/2016 9:41 AM

## 2016-01-11 NOTE — Discharge Instructions (Signed)
Follow with Primary MD Neldon LabellaMILLER,[name], MD in 7 days   Get CBC, CMP, 2 view Chest X ray checked  by Primary MD or SNF MD in 5-7 days ( we routinely change or add medications that can affect your baseline labs and fluid status, therefore we recommend that you get the mentioned basic workup next visit with your PCP, your PCP may decide not to get them or add new tests based on their clinical decision)   Activity: As tolerated with Full fall precautions use walker/cane & assistance as needed   Disposition Home     Diet:   Diet heart healthy/carb modified  Check your Weight same time everyday, if you gain over 2 pounds, or you develop in leg swelling, experience more shortness of breath or chest pain, call your Primary MD immediately. Follow Cardiac Low Salt Diet and 1.5 lit/day fluid restriction.   On your next visit with your primary care physician please Get Medicines reviewed and adjusted.   Please request your Prim.MD to go over all Hospital Tests and Procedure/Radiological results at the follow up, please get all Hospital records sent to your Prim MD by signing hospital release before you go home.   If you experience worsening of your admission symptoms, develop shortness of breath, life threatening emergency, suicidal or homicidal thoughts you must seek medical attention immediately by calling 911 or calling your MD immediately  if symptoms less severe.  You Must read complete instructions/literature along with all the possible adverse reactions/side effects for all the Medicines you take and that have been prescribed to you. Take any new Medicines after you have completely understood and accpet all the possible adverse reactions/side effects.   Do not drive, operate heavy machinery, perform activities at heights, swimming or participation in water activities or provide baby sitting services if your were admitted for syncope or siezures until you have seen by Primary MD or a Neurologist  and advised to do so again.  Do not drive when taking Pain medications.    Do not take more than prescribed Pain, Sleep and Anxiety Medications  Special Instructions: If you have smoked or chewed Tobacco  in the last 2 yrs please stop smoking, stop any regular Alcohol  and or any Recreational drug use.  Wear Seat belts while driving.   Please note  You were cared for by a hospitalist during your hospital stay. If you have any questions about your discharge medications or the care you received while you were in the hospital after you are discharged, you can call the unit and asked to speak with the hospitalist on call if the hospitalist that took care of you is not available. Once you are discharged, your primary care physician will handle any further medical issues. Please note that NO REFILLS for any discharge medications will be authorized once you are discharged, as it is imperative that you return to your primary care physician (or establish a relationship with a primary care physician if you do not have one) for your aftercare needs so that they can reassess your need for medications and monitor your lab values.

## 2016-01-11 NOTE — Progress Notes (Signed)
Pt being discharged home via wheelchair with family. Pt alert and oriented x4. VSS. Pt c/o no pain at this time. No signs of respiratory distress. Education complete and care plans resolved. IV removed with catheter intact and pt tolerated well. No further issues at this time. Pt to follow up with PCP. Adryana Mogensen R, RN 

## 2016-01-18 ENCOUNTER — Encounter (HOSPITAL_COMMUNITY): Payer: Self-pay

## 2016-01-18 ENCOUNTER — Encounter (HOSPITAL_COMMUNITY): Payer: Self-pay | Admitting: *Deleted

## 2016-01-18 ENCOUNTER — Telehealth: Payer: Self-pay | Admitting: Vascular Surgery

## 2016-01-18 ENCOUNTER — Other Ambulatory Visit (HOSPITAL_COMMUNITY): Payer: Self-pay | Admitting: *Deleted

## 2016-01-18 ENCOUNTER — Ambulatory Visit (HOSPITAL_COMMUNITY)
Admit: 2016-01-18 | Discharge: 2016-01-18 | Disposition: A | Discharge status: Home / Self Care | Payer: BLUE CROSS/BLUE SHIELD | Attending: Cardiology | Admitting: Cardiology

## 2016-01-18 VITALS — BP 112/68 | HR 88 | Wt 224.5 lb

## 2016-01-18 DIAGNOSIS — I5022 Chronic systolic (congestive) heart failure: Secondary | ICD-10-CM | POA: Insufficient documentation

## 2016-01-18 DIAGNOSIS — I679 Cerebrovascular disease, unspecified: Secondary | ICD-10-CM

## 2016-01-18 DIAGNOSIS — I10 Essential (primary) hypertension: Secondary | ICD-10-CM | POA: Diagnosis not present

## 2016-01-18 DIAGNOSIS — I739 Peripheral vascular disease, unspecified: Secondary | ICD-10-CM

## 2016-01-18 DIAGNOSIS — I25700 Atherosclerosis of coronary artery bypass graft(s), unspecified, with unstable angina pectoris: Secondary | ICD-10-CM

## 2016-01-18 DIAGNOSIS — Z95810 Presence of automatic (implantable) cardiac defibrillator: Secondary | ICD-10-CM

## 2016-01-18 DIAGNOSIS — F17200 Nicotine dependence, unspecified, uncomplicated: Secondary | ICD-10-CM | POA: Diagnosis not present

## 2016-01-18 DIAGNOSIS — I2510 Atherosclerotic heart disease of native coronary artery without angina pectoris: Secondary | ICD-10-CM | POA: Diagnosis not present

## 2016-01-18 LAB — BASIC METABOLIC PANEL
ANION GAP: 6 (ref 5–15)
BUN: 17 mg/dL (ref 6–20)
CO2: 28 mmol/L (ref 22–32)
Calcium: 9.1 mg/dL (ref 8.9–10.3)
Chloride: 100 mmol/L — ABNORMAL LOW (ref 101–111)
Creatinine, Ser: 0.76 mg/dL (ref 0.61–1.24)
GFR calc Af Amer: >60 mL/min (ref >60)
GLUCOSE: 126 mg/dL — AB (ref 65–99)
POTASSIUM: 4.5 mmol/L (ref 3.5–5.1)
Sodium: 134 mmol/L — ABNORMAL LOW (ref 135–145)

## 2016-01-18 LAB — CBC
HEMATOCRIT: 39.2 % (ref 39.0–52.0)
HEMOGLOBIN: 12.7 g/dL — AB (ref 13.0–17.0)
MCH: 27.8 pg (ref 26.0–34.0)
MCHC: 32.4 g/dL (ref 30.0–36.0)
MCV: 85.8 fL (ref 78.0–100.0)
Platelets: 158 10*3/uL (ref 150–400)
RBC: 4.57 MIL/uL (ref 4.22–5.81)
RDW: 18.9 % — ABNORMAL HIGH (ref 11.5–15.5)
WBC: 7 10*3/uL (ref 4.0–10.5)

## 2016-01-18 LAB — PROTIME-INR
INR: 1.07
PROTHROMBIN TIME: 13.9 s (ref 11.4–15.2)

## 2016-01-18 LAB — DIGOXIN LEVEL: Digoxin Level: 0.5 ng/mL — ABNORMAL LOW (ref 0.8–2.0)

## 2016-01-18 MED ORDER — ATORVASTATIN CALCIUM 80 MG PO TABS: 80.0000 mg | ORAL_TABLET | ORAL | 11 refills | Status: DC

## 2016-01-18 MED ORDER — POTASSIUM CHLORIDE CRYS ER 20 MEQ PO TBCR: 20.0000 meq | EXTENDED_RELEASE_TABLET | Freq: Two times a day (BID) | ORAL | 3 refills | Status: DC

## 2016-01-18 MED ORDER — TORSEMIDE 20 MG PO TABS: ORAL_TABLET | ORAL | 3 refills | Status: DC

## 2016-01-18 MED ORDER — CARVEDILOL 3.125 MG PO TABS: 3.1250 mg | ORAL_TABLET | Freq: Two times a day (BID) | ORAL | 3 refills | Status: DC

## 2016-01-18 MED ORDER — CLOPIDOGREL BISULFATE 75 MG PO TABS: 75.0000 mg | ORAL_TABLET | Freq: Every day | ORAL | 6 refills | Status: DC

## 2016-01-18 NOTE — Telephone Encounter (Signed)
Added LE bypass to the appt

## 2016-01-18 NOTE — Telephone Encounter (Signed)
-----   Message from Bayard HuggerAllison A Petty, LPN sent at 1/61/09601/16/2018  1:13 PM EST ----- Regarding: FW: Order Review Elon JesterMichele,  Could you please add a LE Bypass graft to this PT appt on 04/26/16?  Thank you, Revonda StandardAllison ----- Message ----- From: Rudean HaskellMelinda M Reaves Sent: 01/17/2016   2:52 PM To: Bayard HuggerAllison A Petty, LPN Subject: Order Review                                   It appears patient should also be scheduled for a LE Bypass graft exam.  Appointment is 04/26/16 Juliette AlcideMelinda

## 2016-01-18 NOTE — Patient Instructions (Signed)
Increase Torsemide to 80 mg (4 tabs) in AM and 40 mg (2 tabs) in PM  Increase Potassium to 20 meq (1 tabs) Twice daily   Start Plavix 75 mg daily  Start Carvedilol 3.125 mg Twice daily   Labs today  You have been referred to Cardiac Rehab, they will contact you to schedule  Your physician has requested that you have a cardiac catheterization. Cardiac catheterization is used to diagnose and/or treat various heart conditions. Doctors may recommend this procedure for a number of different reasons. The most common reason is to evaluate chest pain. Chest pain can be a symptom of coronary artery disease (CAD), and cardiac catheterization can show whether plaque is narrowing or blocking your heart's arteries. This procedure is also used to evaluate the valves, as well as measure the blood flow and oxygen levels in different parts of your heart. For further information please visit [email or web address]. Please follow instruction sheet, as given.  Your physician recommends that you schedule a follow-up appointment in: 2-3 weeks

## 2016-01-19 NOTE — Progress Notes (Signed)
PCP: Dr. Sigmund HazelLisa [name] Cardiology: Dr. Jens Somrenshaw HF Cardiology: Dr. Shirlee LatchMcLean  61 yo smoker with history of PAD, CAD, and ischemic cardiomyopathy presents for CHF clinic evaluation after recent admission.  Patient was admitted in 1/18 with PNA and acute on chronic systolic CHF.  He was markedly volume overloaded and also had low cardiac output requiring dobutamine use.  He had a right pleural effusion and underwent thoracentesis.  Fluid studies suggested a parapneumonic effusion.  He had cardiac cath, showing 3 vessel disease and markedly elevated filling pressures, right and left.  He was evaluated for CABG but turned down given comorbidities and lack of venous conduits.  We were able to wean him off dobutamine and discharged him with plan for outpatient PCI once he had recovered from the current hospitalization.   Since getting home, he has gained around 10 lbs.  He is short of breath after walking about 1/4 mile on flat ground . No orthopnea/PND.  No chest pain.  Legs feel weak with ambulation.   Corevue: impedance trending down.   Labs (1/18): K 4.2, creatinine 0.9, hgb 12.6  ECG (1/18): NSR, old anterior infarct, narrow QRS  PMH: 1. PAD: Left fem-pop bypass with graft in 2017.  2. CAD: Anterior MI 2005 => PCI to LAD followed by staged PCI to LCx and RCA.   - LHC (1/18): Diffuse up to 80% calcified stenosis throughout the proximal LAD.  Diffuse disease in distal LAD up to 50%.  Large OM1 totally occluded at the ostium but reconstituted via collaterals.  80% distal RCA stenosis involving the ostia of the PLV and PDA with about 70% stenosis.  3. Chronic systolic CHF: Ischemic cardiomyopathy.  St Jude ICD.  - Echo (1/18) with EF 20%, mildly decreased RV systolic function.  - RHC (1/18) with mean RA 21, PA 69/31 mean 45, mean PCWP 27, CI 2.37, PVR 3.3 WU.  4. Carotid stenosis: Carotid dopplers (8/16) with 60-79% RICA, 40-59% LICA.  5. Type II diabetes. 6. Hyperlipidemia. 7. HTN 8. Active  smoker.  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  Smoking around 1 ppd.  Occasional ETOH.   Family History  Problem Relation Age of Onset  . Diabetes Mother   . Coronary artery disease Father   . Heart disease Father     before age 62   ROS: All systems reviewed and negative except as per HPI.   Current Outpatient Prescriptions  Medication Sig Dispense Refill  . aspirin 81 MG tablet Take 1 tablet (81 mg total) by mouth daily. 30 tablet 3  . atorvastatin (LIPITOR) 80 MG tablet Take 1 tablet (80 mg total) by mouth every other day. 30 tablet 11  . digoxin (LANOXIN) 0.125 MG tablet Take 1 tablet (0.125 mg total) by mouth daily. 30 tablet 0  . ezetimibe (ZETIA) 10 MG tablet TAKE ONE TABLET BY MOUTH ONCE DAILY 30 tablet 6  . insulin NPH-regular Human (NOVOLIN 70/30) (70-30) 100 UNIT/ML injection Inject 16-32 Units into the skin 2 (two) times daily with a meal. 32 units in the morning, 16 units in the evening    . losartan (COZAAR) 25 MG tablet Take 0.5 tablets (12.5 mg total) by mouth 2 (two) times daily. 30 tablet 0  . potassium chloride SA (K-DUR,KLOR-CON) 20 MEQ tablet Take 1 tablet (20 mEq total) by mouth 2 (two) times daily. 60 tablet 3  . spironolactone (ALDACTONE) 25 MG tablet Take 1 tablet (25 mg total) by mouth daily. 30 tablet 0  .  torsemide (DEMADEX) 20 MG tablet Take 4 tabs in AM and 2 tabs in PM 180 tablet 3  . carvedilol (COREG) 3.125 MG tablet Take 1 tablet (3.125 mg total) by mouth 2 (two) times daily. 60 tablet 3  . clopidogrel (PLAVIX) 75 MG tablet Take 1 tablet (75 mg total) by mouth daily. 30 tablet 6   No current facility-administered medications for this encounter.    BP 112/68   Pulse 88   Wt 224 lb 8 oz (101.8 kg)   SpO2 100%   BMI 30.45 kg/m  General: NAD Neck: JVP 9-10 cm, no thyromegaly or thyroid nodule.  Lungs: Decrease breath sounds right base.  CV: Nondisplaced PMI.  Heart regular S1/S2, no S3/S4, 2/6 early SEM RUSB.  Chronic 1+ edema to  knees.   No carotid bruit.  Unable to palpate pedal pulses. Abdomen: Soft, nontender, no hepatosplenomegaly, no distention.  Skin: Lower leg discoloration suggestive of venous stasis dermatitis.  Neurologic: Alert and oriented x 3.  Psych: Normal affect. Extremities: No clubbing or cyanosis.  HEENT: Normal.   Assessment/Plan: 1. Chronic systolic CHF: Ischemic cardiomyopathy.  Echo (1/18) with EF 20%, mildly decreased RV systolic function.  Significant component of RV failure.  St Jude ICD.  Volume overloaded on exam today with significant weight gain since discharge.  He is volume overloaded by exam and impedance decreasing on Corevue. - Increase torsemide to 80 qam/40 qpm.  Add KCl 20 bid.  - BMET in 10 days.  - Continue current digoxin, check level.  - Continue current losartan and spironolactone.  - Will add low dose Coreg, 3.125 mg bid.  - I am concerned given recent admission with low output HF and need for dobutamine at least transiently.  He may be nearing the time that we would need to consider advanced therapies (LVAD given ongoing smoking, so not transplant candidate).  It is possible that PCI will improve symptoms/LV function (see below).  2. CAD: 3 vessel CAD, see PMH section for description of last cath.  He was turned down for CABG due to comorbidities and lack of venous conduits.  I reviewed his films with Dr. Excell Seltzer, plan to bring him back for PCI to LAD.   - Arrange for PCI to LAD next week.  - I will go ahead and start him on Plavix. He will continue ASA 81 and statin.  - Refer to cardiac rehab.  3. PAD: Stable claudication (manifested as leg weakness).  He follows with [facility]. 4. Carotid stenosis: He is due for carotid dopplers.   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  I think this is reasonable.   Followup in 2-3 weeks.   Marca Ancona 01/19/2016

## 2016-01-26 ENCOUNTER — Emergency Department (HOSPITAL_COMMUNITY)
Admission: EM | Admit: 2016-01-26 | Discharge: 2016-01-26 | Disposition: A | Discharge status: Home / Self Care | Payer: BLUE CROSS/BLUE SHIELD | Attending: Emergency Medicine | Admitting: Emergency Medicine

## 2016-01-26 ENCOUNTER — Encounter (HOSPITAL_COMMUNITY): Payer: Self-pay

## 2016-01-26 DIAGNOSIS — W228XXA Striking against or struck by other objects, initial encounter: Secondary | ICD-10-CM | POA: Diagnosis not present

## 2016-01-26 DIAGNOSIS — S81819A Laceration without foreign body, unspecified lower leg, initial encounter: Secondary | ICD-10-CM | POA: Insufficient documentation

## 2016-01-26 DIAGNOSIS — Z95810 Presence of automatic (implantable) cardiac defibrillator: Secondary | ICD-10-CM | POA: Diagnosis not present

## 2016-01-26 DIAGNOSIS — E119 Type 2 diabetes mellitus without complications: Secondary | ICD-10-CM | POA: Insufficient documentation

## 2016-01-26 DIAGNOSIS — I110 Hypertensive heart disease with heart failure: Secondary | ICD-10-CM | POA: Diagnosis not present

## 2016-01-26 DIAGNOSIS — Z794 Long term (current) use of insulin: Secondary | ICD-10-CM | POA: Diagnosis not present

## 2016-01-26 DIAGNOSIS — Y929 Unspecified place or not applicable: Secondary | ICD-10-CM | POA: Diagnosis not present

## 2016-01-26 DIAGNOSIS — I5023 Acute on chronic systolic (congestive) heart failure: Secondary | ICD-10-CM | POA: Insufficient documentation

## 2016-01-26 DIAGNOSIS — S81811A Laceration without foreign body, right lower leg, initial encounter: Secondary | ICD-10-CM

## 2016-01-26 DIAGNOSIS — F17210 Nicotine dependence, cigarettes, uncomplicated: Secondary | ICD-10-CM | POA: Insufficient documentation

## 2016-01-26 DIAGNOSIS — I2510 Atherosclerotic heart disease of native coronary artery without angina pectoris: Secondary | ICD-10-CM | POA: Diagnosis not present

## 2016-01-26 DIAGNOSIS — Z23 Encounter for immunization: Secondary | ICD-10-CM | POA: Diagnosis not present

## 2016-01-26 DIAGNOSIS — Y939 Activity, unspecified: Secondary | ICD-10-CM | POA: Insufficient documentation

## 2016-01-26 DIAGNOSIS — Z7982 Long term (current) use of aspirin: Secondary | ICD-10-CM | POA: Diagnosis not present

## 2016-01-26 DIAGNOSIS — Y999 Unspecified external cause status: Secondary | ICD-10-CM | POA: Insufficient documentation

## 2016-01-26 MED ORDER — TETANUS-DIPHTH-ACELL PERTUSSIS 5-2.5-18.5 LF-MCG/0.5 IM SUSP
0.5000 mL | Freq: Once | INTRAMUSCULAR | Status: AC
  Administered 2016-01-26: 0.5 mL via INTRAMUSCULAR
  Filled 2016-01-26: qty 0.5

## 2016-01-26 MED ORDER — LIDOCAINE-EPINEPHRINE (PF) 2 %-1:200000 IJ SOLN: 10.0000 mL | Freq: Once | INTRAMUSCULAR | Status: DC

## 2016-01-26 MED ORDER — SULFAMETHOXAZOLE-TRIMETHOPRIM 800-160 MG PO TABS: 1.0000 | ORAL_TABLET | Freq: Two times a day (BID) | ORAL | 0 refills | Status: DC

## 2016-01-26 NOTE — Discharge Instructions (Signed)
We saw you in the ER for your WOUND. Please read the instructions provided on wound care. Keep the area clean and dry, apply bacitracin ointment daily and take the medications provided. RETURN TO THE ER IF THERE IS INCREASED PAIN, REDNESS, PUS COMING OUT from the wound site.   Stitches placed were ABSORBABLE, they will likely dissolve in a couple of weeks.

## 2016-01-26 NOTE — ED Provider Notes (Signed)
[facility]-EMERGENCY DEPT Provider Note   CSN: [id number] Arrival date & time: 01/26/16  1856     History   Chief Complaint Chief Complaint  Patient presents with  . Extremity Laceration    HPI [name] is a 61 y.o. male.  HPI 61 y.o. male with PMHx of CAD, CHF, DM, HTN, HLD, peripheral vessel disease comes in with cc of laceration. Pt reports that hit a cardboard box and started having profuse bleeding. Pt was "spraying and squirting" blood everywhere, so he wrapped the leg and had friend bring him to the ER. Pt has no new numbness, tingling, weakness. Pt has no significant pain. Pt is not sure when the last tetanus shot was.  Past Medical History:  Diagnosis Date  . AICD (automatic cardioverter/defibrillator) present   . Barretts syndrome   . Carotid artery disease (HCC)    a. Dopp 09/2011: 60-79% RICA, 40-59% LICA.;  b.  Carotid US (7/14):  Bilateral 60-79% => f/u 6 mos  . Cerebrovascular disease   . Chronic systolic heart failure (HCC)   . Coronary artery disease    a. AWMI requiring IABP 2005 s/p Horizon study stent-LAD, staged BMS-Cx, DESx2-RCA. b. Last Last LHC (6/06):  EF 25%, pLAD stent 40-50, after stent 40, dLAD 20, pCFX 20, pOM1 70, pRCA 20, mRCA stents ok.=> med Rx;  c.  Myoview (7/14):  EF 26%, large ant, septal, inf and apical infarct, no ischemia.  Med Rx continued  . Diabetes mellitus   . Heart murmur   . History of kidney stones   . HTN (hypertension)   . Hyperlipidemia    mixed  . ICD (implantable cardiac defibrillator), dual, in situ    St. Jude for severe LVD EF 25% 2/07 explanted 2010. Medtronic Virtuoso II DR Dual-chamber cardiverter-defibrillator  with pocket revision, Dr. Graciela HusbandsKlein  . Ischemic cardiomyopathy    a. Echo (09/27/12): EF 20%, diffuse HK with periapical AK, no LV thrombus noted, restrictive physiology, trivial MR, mild LAE, RVSF mildly reduced, PASP 39  . Pleural effusion 01/05/2016  . Presence of permanent cardiac pacemaker   . PVD  (peripheral vascular disease) (HCC)    a. ABI 09/2011 - R normal, L moderate - saw Dr. Kirke CorinArida - med rx.   Marland Kitchen. RIATA ICD Lead--on advisory recall    . Tobacco abuse     Patient Active Problem List   Diagnosis Date Noted  . Ischemic cardiomyopathy   . Community acquired pneumonia 01/05/2016  . CAP (community acquired pneumonia)   . Pleural effusion on right   . Diabetes mellitus with complication (HCC)   . UTI (lower urinary tract infection) 03/13/2015  . Hypoglycemia 03/13/2015  . Chronic systolic CHF (congestive heart failure) (HCC) 03/13/2015  . Cellulitis of left lower extremity   . Pressure ulcer 12/31/2014  . CHF, acute on chronic (HCC) 12/30/2014  . Acute on chronic heart failure (HCC) 12/30/2014  . Pleural effusion 12/30/2014  . Acute on chronic systolic congestive heart failure (HCC)   . Hyperglycemia without ketosis   . UTI (urinary tract infection) 09/23/2014  . Infected open wound 09/22/2014  . Type 2 diabetes mellitus (HCC) 09/22/2014  . Skin ulcer (HCC) 09/21/2014  . Type 2 diabetes mellitus with hyperglycemia (HCC)   . PAD (peripheral artery disease) (HCC) 11/07/2013  . PVD (peripheral vascular disease) (HCC)   . Pre-operative cardiovascular examination   . Cellulitis of right foot 11/03/2013  . Cellulitis of right lower extremity 11/03/2013  . Metabolic alkalosis 12/07/2012  .  Acute on chronic systolic heart failure (HCC) 12/03/2012  . Upper respiratory infection 10/01/2012  . Hypotension 10/01/2012  . Carotid stenosis 10/25/2011  . Hypertension 08/09/2010  . Automatic implantable cardioverter-defibrillator in St. Jude 08/09/2010  . Coronary atherosclerosis 01/21/2009  . AODM 05/15/2008  . TOBACCO ABUSE 05/15/2008  . CARDIOMYOPATHY, ISCHEMIC 05/15/2008  . Cerebrovascular disease 05/15/2008  . Peripheral vascular disease (HCC) 05/15/2008  . CHEST PAIN 05/15/2008  . Hyperlipidemia 01/22/2008  . SYSTOLIC HEART FAILURE, CHRONIC 01/22/2008    Past Surgical  History:  Procedure Laterality Date  . ABDOMINAL AORTAGRAM N/A 11/06/2013   Procedure: ABDOMINAL Ronny Flurry;  Surgeon: Chuck Hint, MD;  Location: [facility] CATH LAB;  Service: Cardiovascular;  Laterality: N/A;  . APPENDECTOMY  2000   ruptured  . CARDIAC CATHETERIZATION N/A 01/07/2016   Procedure: Right/Left Heart Cath and Coronary Angiography;  Surgeon: Laurey Morale, MD;  Location: [facility] INVASIVE CV LAB;  Service: Cardiovascular;  Laterality: N/A;  . FEMORAL-POPLITEAL BYPASS GRAFT Right 11/07/2013   Procedure: Right FEMORAL-POPLITEAL ARTERY Bypass ;  Surgeon: Chuck Hint, MD;  Location: [facility] OR;  Service: Vascular;  Laterality: Right;  . FEMORAL-POPLITEAL BYPASS GRAFT Left 11/17/2014   Procedure: BYPASS GRAFT FEMORAL-POPLITEAL ARTERY USING GORE PROPATEN X 80CM VASCULAR GRAFT;  Surgeon: Chuck Hint, MD;  Location[facility] OR;  Service: Vascular;  Laterality: Left;  . IMPLANTABLE CARDIOVERTER DEFIBRILLATOR (ICD) GENERATOR CHANGE N/A 12/16/2012   Procedure: ICD GENERATOR CHANGE;  Surgeon: [name], MD;  Location: [facility] CATH LAB;  Service: Cardiovascular;  Laterality: N/A;  . INTRAOPERATIVE ARTERIOGRAM Right 11/07/2013   Procedure: INTRA OPERATIVE ARTERIOGRAM;  Surgeon: Chuck Hint, MD;  Location: [facility] OR;  Service: Vascular;  Laterality: Right;  . LOWER EXTREMITY ANGIOGRAM Bilateral 11/06/2013   Procedure: LOWER EXTREMITY ANGIOGRAM;  Surgeon: Chuck Hint, MD;  Location: [facility] CATH LAB;  Service: Cardiovascular;  Laterality: Bilateral;  . Medtronic Virtuoso II DR dual-chamber cardioverter-defibrillation with pocket revison     Dr. Sherryl Manges  . PERIPHERAL VASCULAR CATHETERIZATION N/A 09/25/2014   Procedure: Abdominal Aortogram;  Surgeon: Sherren Kerns, MD;  Location: [facility] INVASIVE CV LAB;  Service: Cardiovascular;  Laterality: N/A;  . US GUIDED THORACENTESIS RIGHT ([facility] HX) Right 01/05/2016       Home Medications    Prior to Admission medications     Medication Sig Start Date End Date Taking? Authorizing Provider  aspirin 81 MG tablet Take 1 tablet (81 mg total) by mouth daily. 10/07/12  Yes [name], MD  atorvastatin (LIPITOR) 80 MG tablet Take 1 tablet (80 mg total) by mouth every other day. 01/18/16  Yes Laurey Morale, MD  carvedilol (COREG) 3.125 MG tablet Take 1 tablet (3.125 mg total) by mouth 2 (two) times daily. 01/18/16 04/17/16 Yes Laurey Morale, MD  clopidogrel (PLAVIX) 75 MG tablet Take 1 tablet (75 mg total) by mouth daily. 01/18/16  Yes Laurey Morale, MD  digoxin (LANOXIN) 0.125 MG tablet Take 1 tablet (0.125 mg total) by mouth daily. 01/12/16  Yes Leroy Sea, MD  ezetimibe (ZETIA) 10 MG tablet TAKE ONE TABLET BY MOUTH ONCE DAILY 04/26/15  Yes Lewayne Bunting, MD  insulin NPH-regular Human (NOVOLIN 70/30) (70-30) 100 UNIT/ML injection Inject 16-32 Units into the skin 2 (two) times daily with a meal. 32 units in the morning, 16 units in the evening   Yes Historical Provider, MD  losartan (COZAAR) 25 MG tablet Take 0.5 tablets (12.5 mg total) by mouth 2 (two) times daily.  01/11/16  Yes Leroy Sea, MD  potassium chloride SA (K-DUR,KLOR-CON) 20 MEQ tablet Take 1 tablet (20 mEq total) by mouth 2 (two) times daily. 01/18/16  Yes Laurey Morale, MD  spironolactone (ALDACTONE) 25 MG tablet Take 1 tablet (25 mg total) by mouth daily. 01/12/16  Yes Leroy Sea, MD  torsemide (DEMADEX) 20 MG tablet Take 4 tabs in AM and 2 tabs in PM 01/18/16  Yes Laurey Morale, MD  sulfamethoxazole-trimethoprim (BACTRIM DS,SEPTRA DS) 800-160 MG tablet Take 1 tablet by mouth 2 (two) times daily. 01/26/16 02/02/16  Derwood Kaplan, MD    Family History Family History  Problem Relation Age of Onset  . Diabetes Mother   . Coronary artery disease Father   . Heart disease Father     before age 13    Social History Social History  Substance Use Topics  . Smoking status: Current Every Day Smoker    Packs/day: 1.00    Years: 45.00     Types: Cigarettes  . Smokeless tobacco: Never Used  . Alcohol use No     Allergies   Niacin and Metformin and related   Review of Systems Review of Systems  ROS 10 Systems reviewed and are negative for acute change except as noted in the HPI.     Physical Exam Updated Vital Signs BP 119/56   Pulse 71   Temp 98.9 F (37.2 C) (Oral)   Resp 18   Wt 230 lb (104.3 kg)   SpO2 96%   BMI 31.19 kg/m   Physical Exam  Constitutional: He is oriented to person, place, and time. He appears well-developed.  HENT:  Head: Atraumatic.  Neck: Neck supple.  Cardiovascular: Normal rate and intact distal pulses.   Murmur heard. Pulmonary/Chest: Effort normal.  Musculoskeletal:  R leg - pt has a skin tear measuring about 8 cm. Within the soft tissue he has 2 spots that are oozing our dark blood.  Neurological: He is alert and oriented to person, place, and time.  Subjective numbness bilaterally, but sensation is equal in bilateral lower extremities and skin is warm to touch.  Skin: Skin is warm. Capillary refill takes less than 2 seconds.  Nursing note and vitals reviewed.    ED Treatments / Results  Labs (all labs ordered are listed, but only abnormal results are displayed) Labs Reviewed - No data to display  EKG  EKG Interpretation None       Radiology No results found.  Procedures Procedures (including critical care time)  LACERATION REPAIR Performed by: Derwood Kaplan Authorized by: Derwood Kaplan Consent: Verbal consent obtained. Risks and benefits: risks, benefits and alternatives were discussed Consent given by: patient Patient identity confirmed: provided demographic data Prepped and Draped in normal sterile fashion Wound explored  Laceration Location:   Laceration Length: 8 cm  No Foreign Bodies seen or palpated  Anesthesia: local infiltration  Local anesthetic: lidocaine 1 % with epinephrine  Anesthetic total: 3 ml  Irrigation method:  syringe Amount of cleaning: standard  Skin closure: 2 x 4-0 vicryl figure 8 sutures and steri strips to the overlying skin tear.  Number of sutures: 2  Technique: figure 8  Patient tolerance: Patient tolerated the procedure well with no immediate complications.   Medications Ordered in ED Medications  lidocaine-EPINEPHrine (XYLOCAINE W/EPI) 2 %-1:200000 (PF) injection 10 mL (not administered)  Tdap (BOOSTRIX) injection 0.5 mL (0.5 mLs Intramuscular Given 01/26/16 2112)     Initial Impression / Assessment and Plan / ED  Course  I have reviewed the triage vital signs and the nursing notes.  Pertinent labs & imaging results that were available during my care of the patient were reviewed by me and considered in my medical decision making (see chart for details).     Pt comes in with cc of bleeding. Pt had a cardboard cut his leg accidentally. Pt is neurovascularly intact. Pt is on plavix and he has couple of spots under the skin tear that are bleeding briskly, and we placed 2 figure 8 sutures to control the bleed followed by combat gauze dressing. TDAP updated. Pt has DM, he is a vasculopath so we have concerns for infection. We irrigated the wound with saline. The skin that tore is likely not salvageable, but we didn't debrid it and applied steri strips to it. Pt will be started on bactrim prophylactically - as he is high risk for infection. Strict ER return precautions have been discussed, and patient is agreeing with the plan and is comfortable with the workup done and the recommendations from the ER.  Final Clinical Impressions(s) / ED Diagnoses   Final diagnoses:  Laceration of right lower extremity, initial encounter    New Prescriptions Discharge Medication List as of 01/26/2016 10:52 PM    START taking these medications   Details  sulfamethoxazole-trimethoprim (BACTRIM DS,SEPTRA DS) 800-160 MG tablet Take 1 tablet by mouth 2 (two) times daily., Starting Wed 01/26/2016, Until  Wed 02/02/2016, Print         Derwood Kaplan, MD 01/26/16 [id number]

## 2016-01-26 NOTE — ED Triage Notes (Signed)
Pt hit right leg on cardboard box and reports arterial bleed, bleeding controlled on arrival.

## 2016-01-28 ENCOUNTER — Inpatient Hospital Stay (HOSPITAL_COMMUNITY)
Admission: AD | Admit: 2016-01-28 | Discharge: 2016-02-03 | DRG: 247 | Disposition: A | Discharge status: Home / Self Care | Payer: BLUE CROSS/BLUE SHIELD | Source: Ambulatory Visit | Attending: Cardiovascular Disease | Admitting: Cardiovascular Disease

## 2016-01-28 ENCOUNTER — Encounter (HOSPITAL_COMMUNITY): Payer: Self-pay | Admitting: Cardiovascular Disease

## 2016-01-28 ENCOUNTER — Encounter (HOSPITAL_COMMUNITY)
Admission: AD | Disposition: A | Discharge status: Home / Self Care | Payer: Self-pay | Source: Ambulatory Visit | Attending: Cardiovascular Disease

## 2016-01-28 DIAGNOSIS — I2729 Other secondary pulmonary hypertension: Secondary | ICD-10-CM | POA: Diagnosis present

## 2016-01-28 DIAGNOSIS — Z79899 Other long term (current) drug therapy: Secondary | ICD-10-CM | POA: Diagnosis not present

## 2016-01-28 DIAGNOSIS — E1151 Type 2 diabetes mellitus with diabetic peripheral angiopathy without gangrene: Secondary | ICD-10-CM | POA: Diagnosis present

## 2016-01-28 DIAGNOSIS — I2510 Atherosclerotic heart disease of native coronary artery without angina pectoris: Secondary | ICD-10-CM

## 2016-01-28 DIAGNOSIS — Z95810 Presence of automatic (implantable) cardiac defibrillator: Secondary | ICD-10-CM | POA: Diagnosis not present

## 2016-01-28 DIAGNOSIS — F17200 Nicotine dependence, unspecified, uncomplicated: Secondary | ICD-10-CM | POA: Diagnosis present

## 2016-01-28 DIAGNOSIS — I2582 Chronic total occlusion of coronary artery: Secondary | ICD-10-CM | POA: Diagnosis not present

## 2016-01-28 DIAGNOSIS — Z8249 Family history of ischemic heart disease and other diseases of the circulatory system: Secondary | ICD-10-CM | POA: Diagnosis not present

## 2016-01-28 DIAGNOSIS — E785 Hyperlipidemia, unspecified: Secondary | ICD-10-CM | POA: Diagnosis present

## 2016-01-28 DIAGNOSIS — E871 Hypo-osmolality and hyponatremia: Secondary | ICD-10-CM | POA: Diagnosis present

## 2016-01-28 DIAGNOSIS — I6523 Occlusion and stenosis of bilateral carotid arteries: Secondary | ICD-10-CM | POA: Diagnosis present

## 2016-01-28 DIAGNOSIS — Z794 Long term (current) use of insulin: Secondary | ICD-10-CM

## 2016-01-28 DIAGNOSIS — J90 Pleural effusion, not elsewhere classified: Secondary | ICD-10-CM | POA: Diagnosis not present

## 2016-01-28 DIAGNOSIS — J189 Pneumonia, unspecified organism: Secondary | ICD-10-CM

## 2016-01-28 DIAGNOSIS — I5043 Acute on chronic combined systolic (congestive) and diastolic (congestive) heart failure: Secondary | ICD-10-CM | POA: Diagnosis not present

## 2016-01-28 DIAGNOSIS — I5023 Acute on chronic systolic (congestive) heart failure: Secondary | ICD-10-CM | POA: Diagnosis present

## 2016-01-28 DIAGNOSIS — I5082 Biventricular heart failure: Secondary | ICD-10-CM | POA: Diagnosis present

## 2016-01-28 DIAGNOSIS — T82858A Stenosis of vascular prosthetic devices, implants and grafts, initial encounter: Secondary | ICD-10-CM | POA: Diagnosis present

## 2016-01-28 DIAGNOSIS — E876 Hypokalemia: Secondary | ICD-10-CM | POA: Diagnosis present

## 2016-01-28 DIAGNOSIS — Z7982 Long term (current) use of aspirin: Secondary | ICD-10-CM

## 2016-01-28 DIAGNOSIS — I255 Ischemic cardiomyopathy: Secondary | ICD-10-CM | POA: Diagnosis present

## 2016-01-28 DIAGNOSIS — I110 Hypertensive heart disease with heart failure: Principal | ICD-10-CM | POA: Diagnosis present

## 2016-01-28 DIAGNOSIS — Z833 Family history of diabetes mellitus: Secondary | ICD-10-CM | POA: Diagnosis not present

## 2016-01-28 DIAGNOSIS — I5022 Chronic systolic (congestive) heart failure: Secondary | ICD-10-CM | POA: Diagnosis present

## 2016-01-28 DIAGNOSIS — I252 Old myocardial infarction: Secondary | ICD-10-CM | POA: Diagnosis not present

## 2016-01-28 DIAGNOSIS — I25700 Atherosclerosis of coronary artery bypass graft(s), unspecified, with unstable angina pectoris: Secondary | ICD-10-CM

## 2016-01-28 DIAGNOSIS — Z9889 Other specified postprocedural states: Secondary | ICD-10-CM

## 2016-01-28 DIAGNOSIS — Y848 Other medical procedures as the cause of abnormal reaction of the patient, or of later complication, without mention of misadventure at the time of the procedure: Secondary | ICD-10-CM | POA: Diagnosis present

## 2016-01-28 DIAGNOSIS — Z7902 Long term (current) use of antithrombotics/antiplatelets: Secondary | ICD-10-CM | POA: Diagnosis not present

## 2016-01-28 DIAGNOSIS — Z9861 Coronary angioplasty status: Secondary | ICD-10-CM | POA: Diagnosis not present

## 2016-01-28 HISTORY — PX: CARDIAC CATHETERIZATION: SHX172

## 2016-01-28 LAB — POCT I-STAT 3, VENOUS BLOOD GAS (G3P V)
Acid-Base Excess: 2 mmol/L (ref 0.0–2.0)
Acid-Base Excess: 3 mmol/L — ABNORMAL HIGH (ref 0.0–2.0)
Acid-Base Excess: 4 mmol/L — ABNORMAL HIGH (ref 0.0–2.0)
BICARBONATE: 29.1 mmol/L — AB (ref 20.0–28.0)
Bicarbonate: 26.8 mmol/L (ref 20.0–28.0)
Bicarbonate: 27.4 mmol/L (ref 20.0–28.0)
O2 SAT: 55 %
O2 SAT: 57 %
O2 Saturation: 56 %
PCO2 VEN: 42.2 mmHg — AB (ref 44.0–60.0)
PCO2 VEN: 45.3 mmHg (ref 44.0–60.0)
PH VEN: 7.415 (ref 7.250–7.430)
PO2 VEN: 28 mmHg — AB (ref 32.0–45.0)
PO2 VEN: 30 mmHg — AB (ref 32.0–45.0)
TCO2: 29 mmol/L (ref 0–100)
TCO2: 30 mmol/L (ref 0–100)
TCO2: 28 mmol/L (ref 0–100)
pCO2, Ven: 41.8 mmHg — ABNORMAL LOW (ref 44.0–60.0)
pH, Ven: 7.415 (ref 7.250–7.430)
pH, Ven: 7.42 (ref 7.250–7.430)
pO2, Ven: 29 mmHg — CL (ref 32.0–45.0)

## 2016-01-28 LAB — GLUCOSE, CAPILLARY
GLUCOSE-CAPILLARY: 92 mg/dL (ref 65–99)
GLUCOSE-CAPILLARY: 85 mg/dL (ref 65–99)
Glucose-Capillary: 192 mg/dL — ABNORMAL HIGH (ref 65–99)
Glucose-Capillary: 231 mg/dL — ABNORMAL HIGH (ref 65–99)

## 2016-01-28 LAB — BASIC METABOLIC PANEL
Anion gap: 11 (ref 5–15)
BUN: 20 mg/dL (ref 6–20)
CO2: 28 mmol/L (ref 22–32)
CREATININE: 1.06 mg/dL (ref 0.61–1.24)
Calcium: 9.1 mg/dL (ref 8.9–10.3)
Chloride: 97 mmol/L — ABNORMAL LOW (ref 101–111)
GFR calc Af Amer: >60 mL/min (ref >60)
Glucose, Bld: 91 mg/dL (ref 65–99)
Potassium: 3.9 mmol/L (ref 3.5–5.1)
SODIUM: 136 mmol/L (ref 135–145)

## 2016-01-28 LAB — POCT ACTIVATED CLOTTING TIME
ACTIVATED CLOTTING TIME: 252 s
ACTIVATED CLOTTING TIME: 257 s
ACTIVATED CLOTTING TIME: 241 s
ACTIVATED CLOTTING TIME: 268 s
ACTIVATED CLOTTING TIME: 219 s

## 2016-01-28 LAB — POCT I-STAT 3, ART BLOOD GAS (G3+)
Acid-Base Excess: 2 mmol/L (ref 0.0–2.0)
Bicarbonate: 25.7 mmol/L (ref 20.0–28.0)
O2 Saturation: 97 %
PCO2 ART: 36.2 mmHg (ref 32.0–48.0)
PO2 ART: 81 mmHg — AB (ref 83.0–108.0)
TCO2: 27 mmol/L (ref 0–100)
pH, Arterial: 7.46 — ABNORMAL HIGH (ref 7.350–7.450)

## 2016-01-28 LAB — CBC
HEMATOCRIT: 32.3 % — AB (ref 39.0–52.0)
HEMOGLOBIN: 10.3 g/dL — AB (ref 13.0–17.0)
MCH: 27.8 pg (ref 26.0–34.0)
MCHC: 31.9 g/dL (ref 30.0–36.0)
MCV: 87.1 fL (ref 78.0–100.0)
Platelets: 121 10*3/uL — ABNORMAL LOW (ref 150–400)
RBC: 3.71 MIL/uL — AB (ref 4.22–5.81)
RDW: 18.9 % — ABNORMAL HIGH (ref 11.5–15.5)
WBC: 4.7 10*3/uL (ref 4.0–10.5)

## 2016-01-28 LAB — CREATININE, SERUM
CREATININE: 0.98 mg/dL (ref 0.61–1.24)
GFR calc Af Amer: >60 mL/min (ref >60)

## 2016-01-28 SURGERY — CORONARY STENT INTERVENTION

## 2016-01-28 MED ORDER — ACETAMINOPHEN 325 MG PO TABS: 650.0000 mg | ORAL_TABLET | ORAL | Status: DC | PRN

## 2016-01-28 MED ORDER — MIDAZOLAM HCL 2 MG/2ML IJ SOLN
INTRAMUSCULAR | Status: DC | PRN
  Administered 2016-01-28 (×2): 1 mg via INTRAVENOUS

## 2016-01-28 MED ORDER — LOSARTAN POTASSIUM 25 MG PO TABS: 12.5000 mg | ORAL_TABLET | Freq: Two times a day (BID) | ORAL | Status: DC

## 2016-01-28 MED ORDER — SODIUM CHLORIDE 0.9 % IV SOLN: 250.0000 mL | INTRAVENOUS | Status: DC | PRN

## 2016-01-28 MED ORDER — EZETIMIBE 10 MG PO TABS
10.0000 mg | ORAL_TABLET | Freq: Every day | ORAL | Status: DC
  Administered 2016-01-28 – 2016-02-03 (×7): 10 mg via ORAL
  Filled 2016-01-28 (×7): qty 1

## 2016-01-28 MED ORDER — VERAPAMIL HCL 2.5 MG/ML IV SOLN
INTRAVENOUS | Status: AC
  Filled 2016-01-28: qty 2

## 2016-01-28 MED ORDER — SODIUM CHLORIDE 0.9% FLUSH: 3.0000 mL | INTRAVENOUS | Status: DC | PRN

## 2016-01-28 MED ORDER — ONDANSETRON HCL 4 MG/2ML IJ SOLN: 4.0000 mg | Freq: Four times a day (QID) | INTRAMUSCULAR | Status: DC | PRN

## 2016-01-28 MED ORDER — POTASSIUM CHLORIDE CRYS ER 20 MEQ PO TBCR
20.0000 meq | EXTENDED_RELEASE_TABLET | Freq: Two times a day (BID) | ORAL | Status: DC
  Administered 2016-01-28 – 2016-01-30 (×4): 20 meq via ORAL
  Filled 2016-01-28 (×5): qty 1

## 2016-01-28 MED ORDER — SULFAMETHOXAZOLE-TRIMETHOPRIM 800-160 MG PO TABS
1.0000 | ORAL_TABLET | Freq: Two times a day (BID) | ORAL | Status: DC
  Administered 2016-01-28 – 2016-02-03 (×12): 1 via ORAL
  Filled 2016-01-28 (×14): qty 1

## 2016-01-28 MED ORDER — LABETALOL HCL 5 MG/ML IV SOLN: 10.0000 mg | INTRAVENOUS | Status: AC | PRN

## 2016-01-28 MED ORDER — SPIRONOLACTONE 25 MG PO TABS: 25.0000 mg | ORAL_TABLET | Freq: Every day | ORAL | Status: DC

## 2016-01-28 MED ORDER — LOSARTAN POTASSIUM 25 MG PO TABS
12.5000 mg | ORAL_TABLET | Freq: Two times a day (BID) | ORAL | Status: DC
  Administered 2016-01-28 – 2016-02-03 (×12): 12.5 mg via ORAL
  Filled 2016-01-28 (×12): qty 1

## 2016-01-28 MED ORDER — FENTANYL CITRATE (PF) 100 MCG/2ML IJ SOLN
INTRAMUSCULAR | Status: AC
  Filled 2016-01-28: qty 2

## 2016-01-28 MED ORDER — ATORVASTATIN CALCIUM 80 MG PO TABS
80.0000 mg | ORAL_TABLET | ORAL | Status: DC
  Administered 2016-01-29 – 2016-02-02 (×3): 80 mg via ORAL
  Filled 2016-01-28 (×6): qty 1

## 2016-01-28 MED ORDER — OXYCODONE-ACETAMINOPHEN 5-325 MG PO TABS: 1.0000 | ORAL_TABLET | ORAL | Status: DC | PRN

## 2016-01-28 MED ORDER — NITROGLYCERIN 1 MG/10 ML FOR IR/CATH LAB
INTRA_ARTERIAL | Status: AC
  Filled 2016-01-28: qty 10

## 2016-01-28 MED ORDER — HEPARIN SODIUM (PORCINE) 1000 UNIT/ML IJ SOLN
INTRAMUSCULAR | Status: AC
  Filled 2016-01-28: qty 1

## 2016-01-28 MED ORDER — IOPAMIDOL (ISOVUE-370) INJECTION 76%
INTRAVENOUS | Status: AC
Start: 2016-01-28 — End: 2016-01-28
  Filled 2016-01-28: qty 50

## 2016-01-28 MED ORDER — SODIUM CHLORIDE 0.9% FLUSH: 3.0000 mL | Freq: Two times a day (BID) | INTRAVENOUS | Status: DC

## 2016-01-28 MED ORDER — SPIRONOLACTONE 25 MG PO TABS
25.0000 mg | ORAL_TABLET | Freq: Every day | ORAL | Status: DC
  Administered 2016-01-28 – 2016-02-03 (×7): 25 mg via ORAL
  Filled 2016-01-28 (×7): qty 1

## 2016-01-28 MED ORDER — HYDRALAZINE HCL 20 MG/ML IJ SOLN: 5.0000 mg | INTRAMUSCULAR | Status: AC | PRN

## 2016-01-28 MED ORDER — SODIUM CHLORIDE 0.9% FLUSH
3.0000 mL | Freq: Two times a day (BID) | INTRAVENOUS | Status: DC
  Administered 2016-01-28 – 2016-02-03 (×6): 3 mL via INTRAVENOUS

## 2016-01-28 MED ORDER — FUROSEMIDE 10 MG/ML IJ SOLN
80.0000 mg | Freq: Two times a day (BID) | INTRAMUSCULAR | Status: DC
  Administered 2016-01-28 – 2016-02-02 (×12): 80 mg via INTRAVENOUS
  Filled 2016-01-28 (×12): qty 8

## 2016-01-28 MED ORDER — HEPARIN SODIUM (PORCINE) 1000 UNIT/ML IJ SOLN
INTRAMUSCULAR | Status: AC
Start: 2016-01-28 — End: 2016-01-28
  Filled 2016-01-28: qty 1

## 2016-01-28 MED ORDER — HEPARIN (PORCINE) IN NACL 2-0.9 UNIT/ML-% IJ SOLN
INTRAMUSCULAR | Status: AC
  Filled 2016-01-28: qty 500

## 2016-01-28 MED ORDER — ASPIRIN 81 MG PO CHEW
81.0000 mg | CHEWABLE_TABLET | Freq: Every day | ORAL | Status: DC
  Administered 2016-01-29 – 2016-02-03 (×6): 81 mg via ORAL
  Filled 2016-01-28 (×6): qty 1

## 2016-01-28 MED ORDER — HEPARIN SODIUM (PORCINE) 1000 UNIT/ML IJ SOLN
INTRAMUSCULAR | Status: DC | PRN
  Administered 2016-01-28 (×2): 3000 [IU] via INTRAVENOUS
  Administered 2016-01-28: 10000 [IU] via INTRAVENOUS
  Administered 2016-01-28: 3000 [IU] via INTRAVENOUS

## 2016-01-28 MED ORDER — INSULIN ASPART 100 UNIT/ML ~~LOC~~ SOLN
0.0000 [IU] | Freq: Three times a day (TID) | SUBCUTANEOUS | Status: DC
  Administered 2016-01-28: 3 [IU] via SUBCUTANEOUS
  Administered 2016-01-29: 5 [IU] via SUBCUTANEOUS
  Administered 2016-01-29: 2 [IU] via SUBCUTANEOUS
  Administered 2016-01-29 – 2016-01-30 (×3): 5 [IU] via SUBCUTANEOUS
  Administered 2016-01-30 – 2016-01-31 (×2): 3 [IU] via SUBCUTANEOUS
  Administered 2016-01-31: 5 [IU] via SUBCUTANEOUS
  Administered 2016-01-31: 3 [IU] via SUBCUTANEOUS
  Administered 2016-02-01: 5 [IU] via SUBCUTANEOUS
  Administered 2016-02-01: 3 [IU] via SUBCUTANEOUS
  Administered 2016-02-01: 5 [IU] via SUBCUTANEOUS
  Administered 2016-02-02 (×3): 3 [IU] via SUBCUTANEOUS
  Administered 2016-02-03: 5 [IU] via SUBCUTANEOUS

## 2016-01-28 MED ORDER — DIGOXIN 125 MCG PO TABS
0.1250 mg | ORAL_TABLET | Freq: Every day | ORAL | Status: DC
  Administered 2016-01-28 – 2016-02-03 (×7): 0.125 mg via ORAL
  Filled 2016-01-28 (×7): qty 1

## 2016-01-28 MED ORDER — DIGOXIN 125 MCG PO TABS: 0.1250 mg | ORAL_TABLET | Freq: Every day | ORAL | Status: DC

## 2016-01-28 MED ORDER — MILRINONE LACTATE IN DEXTROSE 20-5 MG/100ML-% IV SOLN
0.2500 ug/kg/min | INTRAVENOUS | Status: DC
  Administered 2016-01-28 – 2016-01-30 (×4): 0.25 ug/kg/min via INTRAVENOUS
  Filled 2016-01-28 (×5): qty 100

## 2016-01-28 MED ORDER — LIDOCAINE HCL (PF) 1 % IJ SOLN
INTRAMUSCULAR | Status: DC | PRN
  Administered 2016-01-28: 5 mL via INTRADERMAL

## 2016-01-28 MED ORDER — CLOPIDOGREL BISULFATE 75 MG PO TABS
75.0000 mg | ORAL_TABLET | Freq: Every day | ORAL | Status: DC
  Administered 2016-01-28 – 2016-02-03 (×7): 75 mg via ORAL
  Filled 2016-01-28 (×7): qty 1

## 2016-01-28 MED ORDER — NITROGLYCERIN 1 MG/10 ML FOR IR/CATH LAB
INTRA_ARTERIAL | Status: DC | PRN
  Administered 2016-01-28: 50 ug via INTRACORONARY
  Administered 2016-01-28 (×2): 100 ug via INTRACORONARY

## 2016-01-28 MED ORDER — IOPAMIDOL (ISOVUE-370) INJECTION 76%
INTRAVENOUS | Status: DC | PRN
Start: 2016-01-28 — End: 2016-01-28
  Administered 2016-01-28: 195 mL via INTRA_ARTERIAL

## 2016-01-28 MED ORDER — LIDOCAINE HCL (PF) 1 % IJ SOLN
INTRAMUSCULAR | Status: AC
  Filled 2016-01-28: qty 30

## 2016-01-28 MED ORDER — IOPAMIDOL (ISOVUE-370) INJECTION 76%
INTRAVENOUS | Status: AC
  Filled 2016-01-28: qty 50

## 2016-01-28 MED ORDER — HEPARIN (PORCINE) IN NACL 2-0.9 UNIT/ML-% IJ SOLN
INTRAMUSCULAR | Status: DC | PRN
  Administered 2016-01-28: 1000 mL

## 2016-01-28 MED ORDER — HEPARIN SODIUM (PORCINE) 5000 UNIT/ML IJ SOLN
5000.0000 [IU] | Freq: Three times a day (TID) | INTRAMUSCULAR | Status: DC
  Administered 2016-01-28 – 2016-02-03 (×17): 5000 [IU] via SUBCUTANEOUS
  Filled 2016-01-28 (×17): qty 1

## 2016-01-28 MED ORDER — MIDAZOLAM HCL 2 MG/2ML IJ SOLN
INTRAMUSCULAR | Status: AC
  Filled 2016-01-28: qty 2

## 2016-01-28 MED ORDER — SODIUM CHLORIDE 0.9 % IV SOLN
INTRAVENOUS | Status: DC
  Administered 2016-01-28: 06:00:00 via INTRAVENOUS

## 2016-01-28 MED ORDER — CARVEDILOL 3.125 MG PO TABS
3.1250 mg | ORAL_TABLET | Freq: Two times a day (BID) | ORAL | Status: DC
  Administered 2016-01-28 – 2016-02-03 (×12): 3.125 mg via ORAL
  Filled 2016-01-28 (×12): qty 1

## 2016-01-28 MED ORDER — VERAPAMIL HCL 2.5 MG/ML IV SOLN
INTRAVENOUS | Status: DC | PRN
  Administered 2016-01-28: 10 mL via INTRA_ARTERIAL

## 2016-01-28 MED ORDER — SODIUM CHLORIDE 0.9% FLUSH
3.0000 mL | Freq: Two times a day (BID) | INTRAVENOUS | Status: DC
  Administered 2016-01-28 – 2016-02-03 (×9): 3 mL via INTRAVENOUS

## 2016-01-28 MED ORDER — FENTANYL CITRATE (PF) 100 MCG/2ML IJ SOLN
INTRAMUSCULAR | Status: DC | PRN
  Administered 2016-01-28 (×2): 25 ug via INTRAVENOUS

## 2016-01-28 SURGICAL SUPPLY — 42 items
BALLN EMERGE MR 2.0X8 (BALLOONS) ×3
BALLN EUPHORA RX 2.0X6 (BALLOONS) ×3
BALLN EUPHORA RX 2.5X12 (BALLOONS) ×3
BALLN MOZEC 1.5X12 (BALLOONS) ×2
BALLN MOZEC 1.5X9 (BALLOONS) ×3
BALLN MOZEC 2.50X20 (BALLOONS) ×2
BALLN SPRINT LEG OTW 1.25X10 (BALLOONS) ×3
BALLN ~~LOC~~ MOZEC 3.0X8 (BALLOONS) ×2
BALLN ~~LOC~~ TREK RX 3.0X15 (BALLOONS) ×3
BALLOON EMERGE MR 2.0X8 (BALLOONS) IMPLANT
BALLOON EUPHORA RX 2.0X6 (BALLOONS) IMPLANT
BALLOON EUPHORA RX 2.5X12 (BALLOONS) IMPLANT
BALLOON MOZEC 1.5X12 (BALLOONS) IMPLANT
BALLOON MOZEC 1.5X9 (BALLOONS) IMPLANT
BALLOON MOZEC 2.50X20 (BALLOONS) IMPLANT
BALLOON SPRINT LEG OTW 1.25X10 (BALLOONS) IMPLANT
BALLOON ~~LOC~~ MOZEC 3.0X8 (BALLOONS) IMPLANT
BALLOON ~~LOC~~ TREK RX 3.0X15 (BALLOONS) IMPLANT
CATH BALLN WEDGE 5F 110CM (CATHETERS) ×2 IMPLANT
CATH MICROGUIDE FINCRSS 150 CM (MICROCATHETER) IMPLANT
CATH OPTICROSS 40MHZ (CATHETERS) ×2 IMPLANT
CATH VISTA GUIDE 6FR XBLAD3.5 (CATHETERS) ×2 IMPLANT
DEVICE RAD COMP TR BAND LRG (VASCULAR PRODUCTS) ×2 IMPLANT
GLIDESHEATH SLEND SS 6F .021 (SHEATH) ×4 IMPLANT
GUIDEWIRE .025 260CM (WIRE) ×2 IMPLANT
GUIDEWIRE INQWIRE 1.5J.035X260 (WIRE) IMPLANT
INQWIRE 1.5J .035X260CM (WIRE) ×3
KIT ENCORE 26 ADVANTAGE (KITS) ×5 IMPLANT
KIT ESSENTIALS PG (KITS) ×2 IMPLANT
KIT HEART LEFT (KITS) ×3 IMPLANT
MICROGUIDE FINECROSS 150 CM (MICROCATHETER) ×3
PACK CARDIAC CATHETERIZATION (CUSTOM PROCEDURE TRAY) ×3 IMPLANT
SHEATH FAST CATH BRACH 5F 5CM (SHEATH) ×2 IMPLANT
SLED PULL BACK IVUS (MISCELLANEOUS) ×2 IMPLANT
STENT PROMUS PREM MR 2.75X32 (Permanent Stent) ×2 IMPLANT
STENT SYNERGY DES 2.25X16 (Permanent Stent) ×2 IMPLANT
TRANSDUCER W/STOPCOCK (MISCELLANEOUS) ×3 IMPLANT
TUBING CIL FLEX 10 FLL-RA (TUBING) ×3 IMPLANT
WIRE ASAHI GRAND SLAM 180CM (WIRE) ×2 IMPLANT
WIRE COUGAR XT STRL 190CM (WIRE) ×2 IMPLANT
WIRE COUGAR XT STRL 300CM (WIRE) ×2 IMPLANT
WIRE MAILMAN 300CM (WIRE) ×2 IMPLANT

## 2016-01-28 NOTE — Progress Notes (Signed)
TR band removed, gauze 2x2 and tegaderm applied, arm elevated with pillow. Continue to monitor, no bleeding noted.

## 2016-01-28 NOTE — H&P (Signed)
Patient ID: [name], male   DOB: 1955/05/11, 61 y.o.   MRN: [id number]    SUBJECTIVE:   Underwent cath 1/5 with severe 3vCAD and elevated filling pressures. Seen by Dr. PVT and not felt to be CABG candidate due to poor cardiac reserve, lack of saphenous vein conduits and other co-morbidities.  Now off dobutamine, co-ox 63%.    Weight stable.  Walked yesterday off oxygen.  Says breathing is ok.    L/R heart cath 01/07/16: Severe CAD LM: ok LAD: long 80% calcified lesion LCX: total OM1 RCA:  Distal 80% at trifurcation RA mean 21 RV 69/24 PA 69/31, mean 45 PCWP mean 27 Cardiac Output (Fick) 5.36 (on dobutamine 2.5) Cardiac Index (Fick) 2.37   Echo (1/18): EF 20%, global hypokinesis, very mildly decreased RV systolic function.   Status post thoracentesis: Exudate by Light's criteria (total protein).  Fluid cultures negative so far.  Scheduled Meds: . aspirin EC  81 mg Oral Daily  . atorvastatin  80 mg Oral QODAY  . digoxin  0.125 mg Oral Daily  . ezetimibe  10 mg Oral Daily  . heparin  5,000 Units Subcutaneous Q8H  . insulin aspart  0-9 Units Subcutaneous TID WC  . insulin glargine  12 Units Subcutaneous QHS  . levofloxacin (LEVAQUIN) IV  750 mg Intravenous Q24H  . losartan  12.5 mg Oral BID  . potassium chloride SA  40 mEq Oral Daily  . sodium chloride flush  3 mL Intravenous Q12H  . spironolactone  25 mg Oral Daily  . torsemide  80 mg Oral Daily   Continuous Infusions: PRN Meds:.sodium chloride, acetaminophen, guaiFENesin-dextromethorphan, ipratropium-albuterol, ondansetron (ZOFRAN) IV          Vitals:   01/10/16 2300 01/11/16 0000 01/11/16 0300 01/11/16 0500  BP: (!) 109/57 (!) 104/52 (!) 101/55   Pulse: 90  70   Resp: (!) 22  18   Temp: 98.3 F (36.8 C)  97.7 F (36.5 C)   TempSrc: Oral  Oral   SpO2: 94% 94% 92%   Weight:    214 lb 8.1 oz (97.3 kg)  Height:        Intake/Output Summary (Last 24 hours) at 01/11/16  0740 Last data filed at 01/11/16 0000  Gross per 24 hour  Intake             1160 ml  Output             1850 ml  Net             -690 ml    LABS: Basic Metabolic Panel:  Recent Labs (last 2 labs)    Recent Labs  01/10/16 0424 01/11/16 0420  NA 132* 131*  K 4.1 4.2  CL 93* 92*  CO2 31 32  GLUCOSE 190* 234*  BUN 13 20  CREATININE 0.86 0.91  CALCIUM 8.3* 8.6*  MG 2.0 2.0     Liver Function Tests: Recent Labs (last 2 labs)   No results for input(s): AST, ALT, ALKPHOS, BILITOT, PROT, ALBUMIN in the last 72 hours.   Recent Labs (last 2 labs)   No results for input(s): LIPASE, AMYLASE in the last 72 hours.   CBC:  Recent Labs (last 2 labs)    Recent Labs  01/08/16 0825 01/09/16 0434 01/11/16 0420  WBC 5.3 4.9 4.9  NEUTROABS 4.1  --   --   HGB 12.8* 12.7* 12.6*  HCT 39.1 39.8 38.1*  MCV 85.7 85.6 84.3  PLT 105*  113* 119*     Cardiac Enzymes: Recent Labs (last 2 labs)   No results for input(s): CKTOTAL, CKMB, CKMBINDEX, TROPONINI in the last 72 hours.   BNP: Recent Labs (last 2 labs)   Invalid input(s): POCBNP   D-Dimer: Recent Labs (last 2 labs)   No results for input(s): DDIMER in the last 72 hours.   Hemoglobin A1C: Recent Labs (last 2 labs)   No results for input(s): HGBA1C in the last 72 hours.   Fasting Lipid Panel: Recent Labs (last 2 labs)   No results for input(s): CHOL, HDL, LDLCALC, TRIG, CHOLHDL, LDLDIRECT in the last 72 hours.   Thyroid Function Tests:  Recent Labs (last 2 labs)   No results for input(s): TSH, T4TOTAL, T3FREE, THYROIDAB in the last 72 hours.  Invalid input(s): FREET3   Anemia Panel: Recent Labs (last 2 labs)   No results for input(s): VITAMINB12, FOLATE, FERRITIN, TIBC, IRON, RETICCTPCT in the last 72 hours.    RADIOLOGY:  Imaging Results  Dg Chest 1 View  Result Date: 01/05/2016 CLINICAL DATA:  Post right thoracentesis. EXAM: CHEST 1 VIEW COMPARISON:  01/05/2016 FINDINGS: Moderate right pleural  effusion with right lower lobe atelectasis or infiltrate. No pneumothorax following thoracentesis. Mild cardiomegaly. Left AICD is unchanged. Left lung is clear. IMPRESSION: Moderate right pleural effusion with right lower lobe atelectasis or infiltrate. No pneumothorax. Electronically Signed   By: Charlett Nose M.D.   On: 01/05/2016 10:38   Dg Chest 2 View  Result Date: 01/06/2016 CLINICAL DATA:  Three weeks of coughing and chest pain. History of CHF, community-acquired pneumonia, pleural effusions, and coronary artery disease. The patient has a permanent pacemaker defibrillator. EXAM: CHEST  2 VIEW COMPARISON:  Portable chest x-ray of January 05, 2016 FINDINGS: There remains increased density in the mid and lower right lung compatible with pleural fluid layering posteriorly. There is a tiny left pleural effusion. The left lung is well-expanded. There are coarse lung markings posteriorly in the right lower lung. The interstitial markings are increased bilaterally. The cardiac silhouette is enlarged. The pulmonary vascularity is engorged. The ICD is in stable position. There is calcification in the wall of the aortic arch. IMPRESSION: There is a moderate-sized right pleural effusion and trace left pleural effusion. There is pulmonary interstitial edema secondary to CHF. There may be right basilar atelectasis or pneumonia. There has been slight interval deterioration in the appearance of the pulmonary interstitium bilaterally since yesterday's study. Thoracic aortic atherosclerosis. Electronically Signed   By: David  [place] M.D.   On: 01/06/2016 08:05   Dg Chest 2 View  Result Date: 01/05/2016 CLINICAL DATA:  Chest pain, cough. History of diabetes, cardiomyopathy/CHF. History of RIGHT thoracentesis. EXAM: CHEST  2 VIEW COMPARISON:  Chest radiograph January 07, 2015 and CT chest January 02, 2015 FINDINGS: Moderate to large RIGHT pleural effusion and underlying consolidation. Mild bronchitic changes. RIGHT  heart border obscured. Mildly calcified aortic knob. Single lead LEFT cardiac pacemaker in situ. No pneumothorax. Old RIGHT lateral rib fractures. IMPRESSION: Moderate to large recurrent RIGHT pleural effusion with underlying consolidation, recommend follow up to exclude mass. Mild bronchitic changes. Electronically Signed   By: Awilda Metro M.D.   On: 01/05/2016 02:04   Ct Chest W Contrast  Result Date: 01/05/2016 CLINICAL DATA:  Sudden onset of left-sided chest pain EXAM: CT CHEST WITH CONTRAST TECHNIQUE: Multidetector CT imaging of the chest was performed during intravenous contrast administration. CONTRAST:  75mL ISOVUE-300 IOPAMIDOL (ISOVUE-300) INJECTION 61% COMPARISON:  Chest radiograph 01/05/2016, CT  chest 01/02/2015 FINDINGS: Cardiovascular: Atherosclerotic calcifications within the aorta. No aneurysm. Extensive coronary artery calcifications. Heart size upper normal. Partially visualized cardiac pacing lead. No large pericardial effusion. Mediastinum/Nodes: Stable nonspecific mediastinal lymph nodes. Trachea is midline. Thyroid gland unremarkable. Esophagus is within normal limits. Lungs/Pleura: Large right-sided pleural effusion with consolidation in the right lower lobe and partial consolidation in the right middle lobe. The left lung demonstrates scattered foci of hazy density in the lingula and left lower lobe which may reflect diffuse atelectasis or possibly mild edema. No pneumothorax. No left effusion. Upper Abdomen: Upper abdominal structures show no acute abnormalities. Ascites adjacent to the spleen and liver. Musculoskeletal: No suspicious bone lesions. Old right-sided rib fractures IMPRESSION: 1. Large right-sided pleural effusion with consolidation in the right lower lobe. Partial consolidation in the right middle lobe, cannot rule out a pneumonia. No significant left pleural effusion. Scattered mild densities within the left upper and lower lobes could reflect scattered foci of  atelectasis or mild edema. 2. Ascites in the upper abdomen 3. No change in mild mediastinal adenopathy. Electronically Signed   By: Jasmine Pang M.D.   On: 01/05/2016 03:44   Dg Chest Port 1 View  Result Date: 01/10/2016 CLINICAL DATA:  Pneumonia. EXAM: PORTABLE CHEST 1 VIEW COMPARISON:  01/06/2016. FINDINGS: Right PICC line its tip noted at the cavoatrial junction. Cardiac pacer stable position. Stable cardiomegaly . Diffuse right lung infiltrate noted consistent with pneumonia prominent right pleural effusion. No pneumothorax. IMPRESSION: 1.  Right PICC line tip noted at the cavoatrial junction. 2. Prominent diffuse right lung infiltrate consistent with pneumonia. Right pleural effusion. 3.  Cardiac pacer stable position.  Persistent cardiomegaly. Electronically Signed   By: Maisie Fus  Register   On: 01/10/2016 06:54   US Thoracentesis Asp Pleural Space W/img Guide  Result Date: 01/05/2016 INDICATION: Congestive heart failure. History of right pleural effusion one year ago. Right chest pain. Large right pleural effusion. Request for diagnostic and therapeutic thoracentesis. EXAM: ULTRASOUND GUIDED RIGHT THORACENTESIS MEDICATIONS: 1% Lidocaine = 10 ml. COMPLICATIONS: None immediate. PROCEDURE: An ultrasound guided thoracentesis was thoroughly discussed with the patient and questions answered. The benefits, risks, alternatives and complications were also discussed. The patient understands and wishes to proceed with the procedure. Written consent was obtained. Ultrasound was performed to localize and mark an adequate pocket of fluid in the right chest. The area was then prepped and draped in the normal sterile fashion. 1% Lidocaine was used for local anesthesia. Under ultrasound guidance a 6 Fr Safe-T-Centesis catheter was introduced. Thoracentesis was performed. The catheter was removed and a dressing applied. FINDINGS: A total of approximately 2 liters of clear yellow fluid was removed. Samples were sent to  the laboratory as requested by the clinical team. IMPRESSION: Successful ultrasound guided right thoracentesis yielding 2 liters of pleural fluid. Post procedure ultrasound revealed remaining moderate effusion. Recommend repeat thoracentesis tomorrow. Read by:  Corrin Parker, PA-C Electronically Signed   By: Judie Petit.  Shick M.D.   On: 01/05/2016 10:23     PHYSICAL EXAM General: NAD  Neck: JVP 7, no thyromegaly or thyroid nodule.  Lungs: coarse bilaterally CV: Nondisplaced PMI.  Heart regular S1/S2, no S3/S4, 1/6 SEM RUSB.  No peripheral edema.  No carotid bruit.  Normal pedal pulses.  Abdomen: Soft, nontender, no hepatosplenomegaly, mild distention.  Neurologic: Alert and oriented x 3.  Psych: Flat affect. Somewhat oppostional Extremities: No clubbing or cyanosis. Severe chronic venous stasis changes lower legs.   TELEMETRY: Reviewed telemetry pt in NSR  ASSESSMENT  AND PLAN: 61 yo with history of PAD (s/p fem-pop), HTN, DM, CAD, ischemic cardiomyopathy/chronic systolic CHF presented with gradual worsening of volume overload and dyspnea.  1. Acute on chronic systolic CHF: Echo 1/18 with EF 20% and very mildly decreased RV systolic function.  Ischemic cardiomyopathy, has [employer] ICD.  Cath results 1/6 as above with severe CAD and elevated filling pressures.  Responded well to IV diuresis and dobutamine. Now euvolemic. Co-ox looks good off dobutamine.  - Continue torsemide 80 mg daily (was on 60 daily at home).  - Continue digoxin and spironolactone - Continue losartan 12.5 mg bid.  - Turned down for CABG by Dr. PVT given comorbidities and lack of venous conduits.  - With soft BP, will not titrate meds any further.  Will try to restart Coreg at low dose as outpatient.  2. PNA: Right-sided PNA noted on chest CT.  He is on levofloxacin per primary service.  Afebrile.  3. Right pleural effusion: s/p thoracentesis Exudative by Light's criteria, parapneumonic.  4. CAD:  Significant 3 vessel disease.  Turned down for CABG.  - Continue ASA, statin, Zetia.  - Cath films reviewed with Dr. Excell Seltzer.  All disease appears chronic.  RCA would be difficult to intervene upon and OM is a subtotal occlusion.  Think it would be reasonable to intervene upon proximal LAD, will likely bring back in a couple weeks after he is stabilized post-current admission for CHF and PNA => will set up PCI with Dr Excell Seltzer when I see him in followup.  5. Smoking: Discussed cessation. 6. Disposition: Can go home today from cardiac standpoint.  He will need followup with me in about a week.  Cardiac meds for home: digoxin 0.125 daily, spironolactone 25 daily, losartan 12.5 bid, ASA 81, atorvastatin 80 daily, KCl 40 daily, Zetia 10 daily, torsemide 80 daily.    Fransico Meadow 7:40 AM 01/11/2016  ADDENDUM: Patient presented today for elective 2 vessel PCI of the LAD and obtuse marginal branch of the circumflex. A right heart catheterization is done at time of the procedure to assess his hemodynamics in the setting of advanced heart failure. This demonstrates severely elevated right-sided heart pressures, low cardiac index, and elevated LVEDP. He will be admitted for treatment of decompensated heart failure by the advanced heart failure team. He tolerated his PCI procedure well. He is on dual antiplatelet therapy with aspirin and Plavix. IV fluids will not be administered post procedure because of his elevated filling pressures. Will defer diuretics and need for inotropic therapy to the advanced heart failure team.

## 2016-01-28 NOTE — H&P (View-Only) (Signed)
PCP: Dr. [name] Cardiology: Dr. Crenshaw HF Cardiology: Dr. McLean  61 yo smoker with history of PAD, CAD, and ischemic cardiomyopathy presents for CHF clinic evaluation after recent admission.  Patient was admitted in 1/18 with PNA and acute on chronic systolic CHF.  He was markedly volume overloaded and also had low cardiac output requiring dobutamine use.  He had a right pleural effusion and underwent thoracentesis.  Fluid studies suggested a parapneumonic effusion.  He had cardiac cath, showing 3 vessel disease and markedly elevated filling pressures, right and left.  He was evaluated for CABG but turned down given comorbidities and lack of venous conduits.  We were able to wean him off dobutamine and discharged him with plan for outpatient PCI once he had recovered from the current hospitalization.   Since getting home, he has gained around 10 lbs.  He is short of breath after walking about 1/4 mile on flat ground . No orthopnea/PND.  No chest pain.  Legs feel weak with ambulation.   Corevue: impedance trending down.   Labs (1/18): K 4.2, creatinine 0.9, hgb 12.6  ECG (1/18): NSR, old anterior infarct, narrow QRS  PMH: 1. PAD: Left fem-pop bypass with graft in 2017.  2. CAD: Anterior MI 2005 => PCI to LAD followed by staged PCI to LCx and RCA.   - LHC (1/18): Diffuse up to 80% calcified stenosis throughout the proximal LAD.  Diffuse disease in distal LAD up to 50%.  Large OM1 totally occluded at the ostium but reconstituted via collaterals.  80% distal RCA stenosis involving the ostia of the PLV and PDA with about 70% stenosis.  3. Chronic systolic CHF: Ischemic cardiomyopathy.  St Jude ICD.  - Echo (1/18) with EF 20%, mildly decreased RV systolic function.  - RHC (1/18) with mean RA 21, PA 69/31 mean 45, mean PCWP 27, CI 2.37, PVR 3.3 WU.  4. Carotid stenosis: Carotid dopplers (8/16) with 60-79% RICA, 40-59% LICA.  5. Type II diabetes. 6. Hyperlipidemia. 7. HTN 8. Active  smoker.  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  Smoking around 1 ppd.  Occasional ETOH.   Family History  Problem Relation Age of Onset  . Diabetes Mother   . Coronary artery disease Father   . Heart disease Father     before age 60   ROS: All systems reviewed and negative except as per HPI.   Current Outpatient Prescriptions  Medication Sig Dispense Refill  . aspirin 81 MG tablet Take 1 tablet (81 mg total) by mouth daily. 30 tablet 3  . atorvastatin (LIPITOR) 80 MG tablet Take 1 tablet (80 mg total) by mouth every other day. 30 tablet 11  . digoxin (LANOXIN) 0.125 MG tablet Take 1 tablet (0.125 mg total) by mouth daily. 30 tablet 0  . ezetimibe (ZETIA) 10 MG tablet TAKE ONE TABLET BY MOUTH ONCE DAILY 30 tablet 6  . insulin NPH-regular Human (NOVOLIN 70/30) (70-30) 100 UNIT/ML injection Inject 16-32 Units into the skin 2 (two) times daily with a meal. 32 units in the morning, 16 units in the evening    . losartan (COZAAR) 25 MG tablet Take 0.5 tablets (12.5 mg total) by mouth 2 (two) times daily. 30 tablet 0  . potassium chloride SA (K-DUR,KLOR-CON) 20 MEQ tablet Take 1 tablet (20 mEq total) by mouth 2 (two) times daily. 60 tablet 3  . spironolactone (ALDACTONE) 25 MG tablet Take 1 tablet (25 mg total) by mouth daily. 30 tablet 0  .   torsemide (DEMADEX) 20 MG tablet Take 4 tabs in AM and 2 tabs in PM 180 tablet 3  . carvedilol (COREG) 3.125 MG tablet Take 1 tablet (3.125 mg total) by mouth 2 (two) times daily. 60 tablet 3  . clopidogrel (PLAVIX) 75 MG tablet Take 1 tablet (75 mg total) by mouth daily. 30 tablet 6   No current facility-administered medications for this encounter.    BP 112/68   Pulse 88   Wt 224 lb 8 oz (101.8 kg)   SpO2 100%   BMI 30.45 kg/m  General: NAD Neck: JVP 9-10 cm, no thyromegaly or thyroid nodule.  Lungs: Decrease breath sounds right base.  CV: Nondisplaced PMI.  Heart regular S1/S2, no S3/S4, 2/6 early SEM RUSB.  Chronic 1+ edema to  knees.   No carotid bruit.  Unable to palpate pedal pulses. Abdomen: Soft, nontender, no hepatosplenomegaly, no distention.  Skin: Lower leg discoloration suggestive of venous stasis dermatitis.  Neurologic: Alert and oriented x 3.  Psych: Normal affect. Extremities: No clubbing or cyanosis.  HEENT: Normal.   Assessment/Plan: 1. Chronic systolic CHF: Ischemic cardiomyopathy.  Echo (1/18) with EF 20%, mildly decreased RV systolic function.  Significant component of RV failure.  St Jude ICD.  Volume overloaded on exam today with significant weight gain since discharge.  He is volume overloaded by exam and impedance decreasing on Corevue. - Increase torsemide to 80 qam/40 qpm.  Add KCl 20 bid.  - BMET in 10 days.  - Continue current digoxin, check level.  - Continue current losartan and spironolactone.  - Will add low dose Coreg, 3.125 mg bid.  - I am concerned given recent admission with low output HF and need for dobutamine at least transiently.  He may be nearing the time that we would need to consider advanced therapies (LVAD given ongoing smoking, so not transplant candidate).  It is possible that PCI will improve symptoms/LV function (see below).  2. CAD: 3 vessel CAD, see PMH section for description of last cath.  He was turned down for CABG due to comorbidities and lack of venous conduits.  I reviewed his films with Dr. Cooper, plan to bring him back for PCI to LAD.   - Arrange for PCI to LAD next week.  - I will go ahead and start him on Plavix. He will continue ASA 81 and statin.  - Refer to cardiac rehab.  3. PAD: Stable claudication (manifested as leg weakness).  He follows with [facility]. 4. Carotid stenosis: He is due for carotid dopplers.   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  I think this is reasonable.   Followup in 2-3 weeks.   [name] 01/19/2016  

## 2016-01-28 NOTE — Progress Notes (Addendum)
Advanced Heart Failure Rounding Note   Subjective:    Today he presented to scheduled intervention. S/P PCI LAD with elevated volume overloadVolume overloaded on cath with low output noted. Says weight at home had gone up 7 pounds. He was taking torsemide 80 mg in am and 40 mg in pm.   Denies SOB/CP   LHC (1/26) Stent to proximal LAD and PTCA to subtotally occluded large OM1 RA 16 PCWP 21 CO 4.35 CI 1.92   Echo (12/17): EF 20%, moderate LVH, decreased RV function.   Objective:   Weight Range:  Vital Signs:   Temp:  [98.7 F (37.1 C)] 98.7 F (37.1 C) (01/26 0537) Pulse Rate:  [0-174] 0 (01/26 1108) Resp:  [0-73] 0 (01/26 1108) BP: (93-115)/(60-74) 102/69 (01/26 1103) SpO2:  [0 %-100 %] 0 % (01/26 1108) Weight:  [230 lb (104.3 kg)] 230 lb (104.3 kg) (01/26 0537)    Weight change: Filed Weights   01/28/16 0537  Weight: 230 lb (104.3 kg)    Intake/Output:  No intake or output data in the 24 hours ending 01/28/16 1134   Physical Exam: General:  Chronically ill appearing. No resp difficulty HEENT: normal Neck: supple. JVP to jaw . Carotids 2+ bilat; no bruits. No lymphadenopathy or thryomegaly appreciated. Cor: PMI nondisplaced. Regular rate & rhythm. No rubs, gallops or murmurs. Lungs: Decreased in the based.  Abdomen: soft, nontender, nondistended. No hepatosplenomegaly. No bruits or masses. Good bowel sounds. Extremities: no cyanosis, clubbing, rash, R and LLE 2+ edema RLE dressing  Neuro: alert & orientedx3, cranial nerves grossly intact. moves all 4 extremities w/o difficulty. Affect pleasant  Telemetry: NSR 60s   Labs: Basic Metabolic Panel:  Recent Labs Lab 01/28/16 0551  NA 136  K 3.9  CL 97*  CO2 28  GLUCOSE 91  BUN 20  CREATININE 1.06  CALCIUM 9.1    Liver Function Tests: No results for input(s): AST, ALT, ALKPHOS, BILITOT, PROT, ALBUMIN in the last 168 hours. No results for input(s): LIPASE, AMYLASE in the last 168 hours. No results  for input(s): AMMONIA in the last 168 hours.  CBC: No results for input(s): WBC, NEUTROABS, HGB, HCT, MCV, PLT in the last 168 hours.  Cardiac Enzymes: No results for input(s): CKTOTAL, CKMB, CKMBINDEX, TROPONINI in the last 168 hours.  BNP: BNP (last 3 results)  Recent Labs  01/05/16 0114  BNP 591.1*    ProBNP (last 3 results) No results for input(s): PROBNP in the last 8760 hours.    Other results:  Imaging:  No results found.   Medications:     Scheduled Medications: . sodium chloride flush  3 mL Intravenous Q12H     Infusions: . [START ON 01/29/2016] sodium chloride 10 mL/hr at 01/28/16 0610     PRN Medications:  sodium chloride, sodium chloride flush   Assessment/Plan/Discussion    1. CAD - 3 vessel CAD S/P PCI LAD-  2. A/C Biventricular Heart Failure - St Jude ICD  Admitted post cath with volume overload. Start lasix 80 mg twice a day. Add milrinone 0.25 mcg. Continue low dose arb but may need to stop if BP drops.  Continue beta blocker. Place PICC line.  Follow renal function closely.  3. PAD 4. Carotid Stenosis  Admitted by Dr Excell Seltzerooper after PCI.  HF team will follow with you.   Length of Stay: 0  Dalton Clegg [name]  01/28/2016, 11:34 AM  Advanced Heart Failure Team Pager [phone] (M-F; 7a - 4p)  Please contact  [facility] for night-coverage after hours (4p -7a ) and weekends on [email or web address]  Patient seen with NP agree with the above note.    [name] had scheduled intervention today with Dr Excell Seltzer => Had PCI to LAD and had PTCA to subtotally occluded large OM1.  He had RHC done also, cardiac index low with predominantly RV failure.  He will need diuresis for probably a couple of days.  - Add milrinone 0.25 and place PICC.  - Lasix 80 mg IV bid.  - Can continue home Coreg (low dose), losartan, spironolactone, digoxin.   I am concerned about him long-term.  Hopefully there will be some improvement in LV function with PCI.   35 minutes  critical care time.   [name] 01/28/2016 2:47 PM

## 2016-01-28 NOTE — Interval H&P Note (Signed)
Cath Lab Visit (complete for each Cath Lab visit)  Clinical Evaluation Leading to the Procedure:   ACS: No.  Non-ACS:    Anginal Classification: CCS III  Anti-ischemic medical therapy: Minimal Therapy (1 class of medications)  Non-Invasive Test Results: No non-invasive testing performed  Prior CABG: No previous CABG      History and Physical Interval Note:  01/28/2016 8:29 AM  [name]  has presented today for surgery, with the diagnosis of cad  The various methods of treatment have been discussed with the patient and family. After consideration of risks, benefits and other options for treatment, the patient has consented to  Procedure(s): Coronary Stent Intervention (N/A) Right Heart Cath (N/A) as a surgical intervention .  The patient's history has been reviewed, patient examined, no change in status, stable for surgery.  I have reviewed the patient's chart and labs.  Questions were answered to the patient's satisfaction.     Tonny Bollmanooper, Toshika Parrow

## 2016-01-28 NOTE — Progress Notes (Signed)
9F sheath pulled from R brachial vein by Adalberto IllAaron Heru Montz. 10 minutes manual pressure applied. Site level 0 with no hematoma noted. Site dressed with tegaderm and 4x4. Instructions given to pt.

## 2016-01-29 ENCOUNTER — Encounter (HOSPITAL_COMMUNITY): Payer: Self-pay

## 2016-01-29 DIAGNOSIS — Z9861 Coronary angioplasty status: Secondary | ICD-10-CM

## 2016-01-29 DIAGNOSIS — I5023 Acute on chronic systolic (congestive) heart failure: Secondary | ICD-10-CM

## 2016-01-29 LAB — BASIC METABOLIC PANEL
Anion gap: 8 (ref 5–15)
BUN: 17 mg/dL (ref 6–20)
CALCIUM: 8.5 mg/dL — AB (ref 8.9–10.3)
CO2: 27 mmol/L (ref 22–32)
Chloride: 98 mmol/L — ABNORMAL LOW (ref 101–111)
Creatinine, Ser: 1.03 mg/dL (ref 0.61–1.24)
GFR calc Af Amer: >60 mL/min (ref >60)
GLUCOSE: 226 mg/dL — AB (ref 65–99)
Potassium: 3.9 mmol/L (ref 3.5–5.1)
Sodium: 133 mmol/L — ABNORMAL LOW (ref 135–145)

## 2016-01-29 LAB — CBC
HEMATOCRIT: 31.8 % — AB (ref 39.0–52.0)
HEMOGLOBIN: 10.2 g/dL — AB (ref 13.0–17.0)
MCH: 27.7 pg (ref 26.0–34.0)
MCHC: 32.1 g/dL (ref 30.0–36.0)
MCV: 86.4 fL (ref 78.0–100.0)
Platelets: 117 10*3/uL — ABNORMAL LOW (ref 150–400)
RBC: 3.68 MIL/uL — ABNORMAL LOW (ref 4.22–5.81)
RDW: 18.7 % — AB (ref 11.5–15.5)
WBC: 5 10*3/uL (ref 4.0–10.5)

## 2016-01-29 LAB — GLUCOSE, CAPILLARY
Glucose-Capillary: 235 mg/dL — ABNORMAL HIGH (ref 65–99)
Glucose-Capillary: 229 mg/dL — ABNORMAL HIGH (ref 65–99)
Glucose-Capillary: 145 mg/dL — ABNORMAL HIGH (ref 65–99)

## 2016-01-29 MED ORDER — METOLAZONE 2.5 MG PO TABS
2.5000 mg | ORAL_TABLET | Freq: Every day | ORAL | Status: DC
  Administered 2016-01-29 – 2016-01-30 (×2): 2.5 mg via ORAL
  Filled 2016-01-29 (×2): qty 1

## 2016-01-29 MED ORDER — POTASSIUM CHLORIDE CRYS ER 20 MEQ PO TBCR
40.0000 meq | EXTENDED_RELEASE_TABLET | Freq: Once | ORAL | Status: AC
  Administered 2016-01-29: 40 meq via ORAL
  Filled 2016-01-29: qty 2

## 2016-01-29 NOTE — Progress Notes (Signed)
CARDIAC REHAB PHASE I   PRE:  Rate/Rhythm: 81 SR w/ multi-f PVCs  BP:  Sitting: 118/69       SaO2: 98  MODE:  Ambulation: 400 ft   POST:  Rate/Rhythm: 89 sinus w/ PVCs  BP:  Sitting: 122/70        SaO2: 100  Assisted pt to bathroom when I first entered the room and waited for pt to finish.  Pt ambulated with IV pole and not assistance and was slightly off balance once and pt stated he felt fine.  Returned to bed after walk with call bell in reach.  Reviewed stent/stent card with pt, discussed smoking cessation in great detail.  Pt states he eventually wants to quit but doesn't seem accepting to quitting anytime soon.  Also discussed diet, (DM diet, carb counting and sodium intake), weighing daily and medication management (plavix/ASA).  Will refer to [place] [facility]  Ples Spectershley L Antonious Omahoney, MS 01/29/2016 12:12 PM

## 2016-01-29 NOTE — Progress Notes (Signed)
Advanced Heart Failure Rounding Note   Subjective:    Presented 1/26 for scheduled PCI/stent to LAD and POBA to OM1. Admitted due to volume overload with R>L failure.   On IV lasix and milrinone. Weight down 2 pounds. Denies SOB/CP. Creatinine stable.   LHC (1/26) Stent to proximal LAD and PTCA to subtotally occluded large OM1 RA 16 PA %2/24 (35) PCWP 21 CO 4.35 CI 1.92  PVR 3.2 WU  Echo (12/17): EF 20%, moderate LVH, decreased RV function.   Objective:   Weight Range:  Vital Signs:   Temp:  [97.7 F (36.5 C)-98.7 F (37.1 C)] 98.7 F (37.1 C) (01/27 1100) Pulse Rate:  [67-89] 76 (01/27 1100) Resp:  [0-22] 19 (01/27 1100) BP: (97-121)/(54-75) 99/56 (01/27 1100) SpO2:  [83 %-97 %] 97 % (01/27 1100) Weight:  [100.9 kg (222 lb 7.1 oz)-103.4 kg (228 lb)] 103.4 kg (228 lb) (01/27 0600)    Weight change: Filed Weights   01/28/16 0537 01/28/16 1538 01/29/16 0600  Weight: 104.3 kg (230 lb) 100.9 kg (222 lb 7.1 oz) 103.4 kg (228 lb)    Intake/Output:   Intake/Output Summary (Last 24 hours) at 01/29/16 1215 Last data filed at 01/29/16 1000  Gross per 24 hour  Intake           901.26 ml  Output             2050 ml  Net         -1148.74 ml     Physical Exam: General: Lying in bed.. No resp difficulty HEENT: normal Neck: supple. JVP to jaw . Carotids 2+ bilat; no bruits. No lymphadenopathy or thryomegaly appreciated. Cor: PMI nondisplaced. Regular rate & rhythm. No rubs, gallops or murmurs. Lungs: decreased throughout no wheeze Abdomen: soft, nontender, + mildly distended. No hepatosplenomegaly. No bruits or masses. Good bowel sounds. Extremities: no cyanosis, clubbing, rash, R and LLE 2+ edema RLE dressing  Neuro: alert & orientedx3, cranial nerves grossly intact. moves all 4 extremities w/o difficulty. Affect pleasant  Telemetry: NSR 60s   Labs: Basic Metabolic Panel:  Recent Labs Lab 01/28/16 0551 01/28/16 1610 01/29/16 0431  NA 136  --  133*  K 3.9   --  3.9  CL 97*  --  98*  CO2 28  --  27  GLUCOSE 91  --  226*  BUN 20  --  17  CREATININE 1.06 0.98 1.03  CALCIUM 9.1  --  8.5*    Liver Function Tests: No results for input(s): AST, ALT, ALKPHOS, BILITOT, PROT, ALBUMIN in the last 168 hours. No results for input(s): LIPASE, AMYLASE in the last 168 hours. No results for input(s): AMMONIA in the last 168 hours.  CBC:  Recent Labs Lab 01/28/16 1610 01/29/16 0431  WBC 4.7 5.0  HGB 10.3* 10.2*  HCT 32.3* 31.8*  MCV 87.1 86.4  PLT 121* 117*    Cardiac Enzymes: No results for input(s): CKTOTAL, CKMB, CKMBINDEX, TROPONINI in the last 168 hours.  BNP: BNP (last 3 results)  Recent Labs  01/05/16 0114  BNP 591.1*    ProBNP (last 3 results) No results for input(s): PROBNP in the last 8760 hours.    Other results:  Imaging: No results found.   Medications:     Scheduled Medications: . aspirin  81 mg Oral Daily  . atorvastatin  80 mg Oral QODAY  . carvedilol  3.125 mg Oral BID WC  . clopidogrel  75 mg Oral Daily  . digoxin  0.125 mg Oral Daily  . ezetimibe  10 mg Oral Daily  . furosemide  80 mg Intravenous BID  . heparin  5,000 Units Subcutaneous Q8H  . insulin aspart  0-15 Units Subcutaneous TID WC  . losartan  12.5 mg Oral BID  . potassium chloride SA  20 mEq Oral BID  . sodium chloride flush  3 mL Intravenous Q12H  . sodium chloride flush  3 mL Intravenous Q12H  . spironolactone  25 mg Oral Daily  . sulfamethoxazole-trimethoprim  1 tablet Oral BID    Infusions: . milrinone 0.25 mcg/kg/min (01/29/16 1100)    PRN Medications: sodium chloride, sodium chloride, acetaminophen, ondansetron (ZOFRAN) IV, oxyCODONE-acetaminophen, sodium chloride flush, sodium chloride flush   Assessment/Plan/Discussion    1. CAD - 3 vessel CAD S/P PCI/stent LAD & POBA to OM-1 on 1/26 --Continue ASA, plavix and statin 2. 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 Echo 01/06/16 EF 20% RV down --Admitted post  cath with volume overload and depressed CO. Milrinone and IV lasix started --Weight down 2 pounds. Renal function stable. Still with volume on board. Will continue current regimen. Give one dose metoalzone --Awaiting PICC --With CI 1.9 low threshold to stop b-blocker.  3. PAD 4. Carotid Stenosis 5. Tobacco use, ongoing 6. Hypokalemia --Will supp   Length of Stay: 1  Asier Desroches MD 01/29/2016, 12:15 PM  Advanced Heart Failure Team Pager [phone] (M-F; 7a - 4p)  Please contact [facility] for night-coverage after hours (4p -7a ) and weekends on [email or web address]

## 2016-01-30 LAB — BASIC METABOLIC PANEL
Anion gap: 8 (ref 5–15)
BUN: 16 mg/dL (ref 6–20)
CO2: 24 mmol/L (ref 22–32)
CREATININE: 1.07 mg/dL (ref 0.61–1.24)
Calcium: 8.6 mg/dL — ABNORMAL LOW (ref 8.9–10.3)
Chloride: 97 mmol/L — ABNORMAL LOW (ref 101–111)
Glucose, Bld: 238 mg/dL — ABNORMAL HIGH (ref 65–99)
Potassium: 4.4 mmol/L (ref 3.5–5.1)
SODIUM: 129 mmol/L — AB (ref 135–145)

## 2016-01-30 LAB — GLUCOSE, CAPILLARY
GLUCOSE-CAPILLARY: 192 mg/dL — AB (ref 65–99)
Glucose-Capillary: 245 mg/dL — ABNORMAL HIGH (ref 65–99)
Glucose-Capillary: 246 mg/dL — ABNORMAL HIGH (ref 65–99)
Glucose-Capillary: 172 mg/dL — ABNORMAL HIGH (ref 65–99)
Glucose-Capillary: 158 mg/dL — ABNORMAL HIGH (ref 65–99)

## 2016-01-30 MED ORDER — MILRINONE LACTATE IN DEXTROSE 20-5 MG/100ML-% IV SOLN
0.1250 ug/kg/min | INTRAVENOUS | Status: DC
  Administered 2016-01-31 – 2016-02-02 (×3): 0.125 ug/kg/min via INTRAVENOUS
  Filled 2016-01-30 (×3): qty 100

## 2016-01-30 NOTE — Progress Notes (Signed)
Advanced Heart Failure Rounding Note   Subjective:    Presented 1/26 for scheduled PCI/stent to LAD and POBA to OM1. Admitted due to volume overload with R>L failure.   On IV lasix and milrinone. Given one dose metolazone yesterday.  Only mild diuresis. Weight up 2 pounds (?). Denies SOB/CP. Creatinine stable.  Says he doesn't want PICC line.   LHC (1/26) Stent to proximal LAD and PTCA to subtotally occluded large OM1 RA 16 PA %2/24 (35) PCWP 21 CO 4.35 CI 1.92  PVR 3.2 WU  Echo (12/17): EF 20%, moderate LVH, decreased RV function.   Objective:   Weight Range:  Vital Signs:   Temp:  [97.7 F (36.5 C)-98.5 F (36.9 C)] 97.9 F (36.6 C) (01/28 1258) Pulse Rate:  [61-84] 61 (01/28 1258) Resp:  [9-20] 19 (01/28 1258) BP: (106-121)/(65-106) 112/70 (01/28 1258) SpO2:  [92 %-98 %] 98 % (01/28 1258) Weight:  [104.4 kg (230 lb 3.2 oz)] 104.4 kg (230 lb 3.2 oz) (01/28 0300)    Weight change: Filed Weights   01/28/16 1538 01/29/16 0600 01/30/16 0300  Weight: 100.9 kg (222 lb 7.1 oz) 103.4 kg (228 lb) 104.4 kg (230 lb 3.2 oz)    Intake/Output:   Intake/Output Summary (Last 24 hours) at 01/30/16 1324 Last data filed at 01/30/16 1300  Gross per 24 hour  Intake           1238.6 ml  Output             2450 ml  Net          -1211.4 ml     Physical Exam: General: Sitting on side of bed eating lunch. No resp difficulty HEENT: normal Neck: supple. JVP to 7. Carotids 2+ bilat; no bruits. No lymphadenopathy or thryomegaly appreciated. Cor: PMI nondisplaced. Regular rate & rhythm. No rubs, gallops or murmurs. Lungs: decreased throughout no wheeze Abdomen: soft, nontender, nondistended. No hepatosplenomegaly. No bruits or masses. Good bowel sounds. Extremities: no cyanosis, clubbing, rash, R and LLE mild woody edema RLE dressing  Neuro: alert & orientedx3, cranial nerves grossly intact. moves all 4 extremities w/o difficulty. Affect pleasant  Telemetry: NSR 70s    Labs: Basic Metabolic Panel:  Recent Labs Lab 01/28/16 0551 01/28/16 1610 01/29/16 0431 01/30/16 0320  NA 136  --  133* 129*  K 3.9  --  3.9 4.4  CL 97*  --  98* 97*  CO2 28  --  27 24  GLUCOSE 91  --  226* 238*  BUN 20  --  17 16  CREATININE 1.06 0.98 1.03 1.07  CALCIUM 9.1  --  8.5* 8.6*    Liver Function Tests: No results for input(s): AST, ALT, ALKPHOS, BILITOT, PROT, ALBUMIN in the last 168 hours. No results for input(s): LIPASE, AMYLASE in the last 168 hours. No results for input(s): AMMONIA in the last 168 hours.  CBC:  Recent Labs Lab 01/28/16 1610 01/29/16 0431  WBC 4.7 5.0  HGB 10.3* 10.2*  HCT 32.3* 31.8*  MCV 87.1 86.4  PLT 121* 117*    Cardiac Enzymes: No results for input(s): CKTOTAL, CKMB, CKMBINDEX, TROPONINI in the last 168 hours.  BNP: BNP (last 3 results)  Recent Labs  01/05/16 0114  BNP 591.1*    ProBNP (last 3 results) No results for input(s): PROBNP in the last 8760 hours.    Other results:  Imaging: No results found.   Medications:     Scheduled Medications: . aspirin  81 mg  Oral Daily  . atorvastatin  80 mg Oral QODAY  . carvedilol  3.125 mg Oral BID WC  . clopidogrel  75 mg Oral Daily  . digoxin  0.125 mg Oral Daily  . ezetimibe  10 mg Oral Daily  . furosemide  80 mg Intravenous BID  . heparin  5,000 Units Subcutaneous Q8H  . insulin aspart  0-15 Units Subcutaneous TID WC  . losartan  12.5 mg Oral BID  . metolazone  2.5 mg Oral Daily  . potassium chloride SA  20 mEq Oral BID  . sodium chloride flush  3 mL Intravenous Q12H  . sodium chloride flush  3 mL Intravenous Q12H  . spironolactone  25 mg Oral Daily  . sulfamethoxazole-trimethoprim  1 tablet Oral BID    Infusions: . milrinone 0.25 mcg/kg/min (01/30/16 1300)    PRN Medications: sodium chloride, sodium chloride, acetaminophen, ondansetron (ZOFRAN) IV, oxyCODONE-acetaminophen, sodium chloride flush, sodium chloride  flush   Assessment/Plan/Discussion    1. CAD - 3 vessel CAD S/P PCI/stent LAD & POBA to OM-1 on 1/26 --Continue ASA, plavix and statin 2. 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 Echo 01/06/16 EF 20% RV down --Admitted post cath with volume overload and depressed CO. Milrinone and IV lasix started --Volume status improved although not too much change in weight. He refuses PICC line. Will cut milrinone to 0.125. Hopefully can stop in am and send home.  --Continue IV lasix and metoalzone one more day --He says that he just can't drink less than 2L/day at home.  --With CI 1.9 low threshold to stop b-blocker.  3. PAD 4. Carotid Stenosis 5. Tobacco use, ongoing 6. Hypokalemia --Supped 7. Hyponatremia. --Free water restrict. Follow. Currently asx.   Length of Stay: 2  Arvilla MeresBensimhon, Herchel Hopkin MD 01/30/2016, 1:24 PM  Advanced Heart Failure Team Pager [phone] (M-F; 7a - 4p)  Please contact [facility] for night-coverage after hours (4p -7a ) and weekends on [email or web address]

## 2016-01-31 ENCOUNTER — Inpatient Hospital Stay (HOSPITAL_COMMUNITY): Payer: BLUE CROSS/BLUE SHIELD

## 2016-01-31 LAB — GLUCOSE, CAPILLARY
GLUCOSE-CAPILLARY: 181 mg/dL — AB (ref 65–99)
Glucose-Capillary: 149 mg/dL — ABNORMAL HIGH (ref 65–99)
Glucose-Capillary: 205 mg/dL — ABNORMAL HIGH (ref 65–99)
Glucose-Capillary: 195 mg/dL — ABNORMAL HIGH (ref 65–99)

## 2016-01-31 LAB — BASIC METABOLIC PANEL
ANION GAP: 12 (ref 5–15)
BUN: 17 mg/dL (ref 6–20)
CHLORIDE: 93 mmol/L — AB (ref 101–111)
CO2: 22 mmol/L (ref 22–32)
Calcium: 8.7 mg/dL — ABNORMAL LOW (ref 8.9–10.3)
Creatinine, Ser: 1.1 mg/dL (ref 0.61–1.24)
GFR calc Af Amer: >60 mL/min (ref >60)
GLUCOSE: 164 mg/dL — AB (ref 65–99)
POTASSIUM: 4.3 mmol/L (ref 3.5–5.1)
Sodium: 127 mmol/L — ABNORMAL LOW (ref 135–145)

## 2016-01-31 LAB — MAGNESIUM: MAGNESIUM: 1.9 mg/dL (ref 1.7–2.4)

## 2016-01-31 MED ORDER — METOLAZONE 5 MG PO TABS: 5.0000 mg | ORAL_TABLET | Freq: Every day | ORAL | Status: DC

## 2016-01-31 MED ORDER — TOLVAPTAN 15 MG PO TABS
15.0000 mg | ORAL_TABLET | Freq: Once | ORAL | Status: AC
  Administered 2016-01-31: 15 mg via ORAL
  Filled 2016-01-31: qty 1

## 2016-01-31 MED ORDER — METOLAZONE 5 MG PO TABS
5.0000 mg | ORAL_TABLET | Freq: Once | ORAL | Status: AC
  Administered 2016-01-31: 5 mg via ORAL
  Filled 2016-01-31: qty 1

## 2016-01-31 MED ORDER — MAGNESIUM SULFATE 2 GM/50ML IV SOLN
2.0000 g | Freq: Once | INTRAVENOUS | Status: AC
  Administered 2016-01-31: 2 g via INTRAVENOUS
  Filled 2016-01-31: qty 50

## 2016-01-31 NOTE — Progress Notes (Addendum)
Advanced Heart Failure Rounding Note   Subjective:    Presented 1/26 for scheduled PCI/stent to LAD and POBA to OM1. Admitted due to volume overload with R>L failure.   Yesterday milrinone dropped to 0.125 mcg/kg/min.  Pt refused PICC so no coox. No home milrinone.    Feeling run down today. Nauseated. Continues to refuse PICC. States he had one a couple of weeks ago and it was a "pain in the neck."   Out 1.2 L.  Weight unchanged.   LHC (1/26) Stent to proximal LAD and PTCA to subtotally occluded large OM1 RA 16 PA 52/24 (35) PCWP 21 CO 4.35 CI 1.92  PVR 3.2 WU  Echo (12/17): EF 20%, moderate LVH, decreased RV function.   Objective:   Weight Range:  Vital Signs:   Temp:  [97.8 F (36.6 C)-98.3 F (36.8 C)] 97.9 F (36.6 C) (01/29 0700) Pulse Rate:  [61-83] 73 (01/29 0700) Resp:  [19-22] 22 (01/28 1633) BP: (104-121)/(65-77) 104/67 (01/29 0700) SpO2:  [96 %-98 %] 97 % (01/29 0700) Weight:  [230 lb 13.2 oz (104.7 kg)] 230 lb 13.2 oz (104.7 kg) (01/29 0500)    Weight change: Filed Weights   01/29/16 0600 01/30/16 0300 01/31/16 0500  Weight: 228 lb (103.4 kg) 230 lb 3.2 oz (104.4 kg) 230 lb 13.2 oz (104.7 kg)    Intake/Output:   Intake/Output Summary (Last 24 hours) at 01/31/16 0813 Last data filed at 01/31/16 0700  Gross per 24 hour  Intake          1410.37 ml  Output             2701 ml  Net         -1290.63 ml     Physical Exam: General: Lying in bed. Fatigued appearing. NAD HEENT: Normal Neck: supple. JVP 12+ cm. Carotids 2+ bilat; no bruits. No thyromegaly or nodule noted.  Cor: PMI nondisplaced. RRR. 2/6 SEM RUSB, no S3/S4.   Lungs: Decreased breath sounds right base.   Abdomen: soft, NT, ND, no HSM. No bruits or masses. +BS  Extremities: no cyanosis, clubbing, rash, Trace to 1+ ankle edema.   Neuro: alert & orientedx3, cranial nerves grossly intact. moves all 4 extremities w/o difficulty. Affect pleasant  Telemetry: Reviewed, NSR  70s  Labs: Basic Metabolic Panel:  Recent Labs Lab 01/28/16 0551 01/28/16 1610 01/29/16 0431 01/30/16 0320 01/31/16 0308  NA 136  --  133* 129* 127*  K 3.9  --  3.9 4.4 4.3  CL 97*  --  98* 97* 93*  CO2 28  --  27 24 22   GLUCOSE 91  --  226* 238* 164*  BUN 20  --  17 16 17   CREATININE 1.06 0.98 1.03 1.07 1.10  CALCIUM 9.1  --  8.5* 8.6* 8.7*  MG  --   --   --   --  1.9    Liver Function Tests: No results for input(s): AST, ALT, ALKPHOS, BILITOT, PROT, ALBUMIN in the last 168 hours. No results for input(s): LIPASE, AMYLASE in the last 168 hours. No results for input(s): AMMONIA in the last 168 hours.  CBC:  Recent Labs Lab 01/28/16 1610 01/29/16 0431  WBC 4.7 5.0  HGB 10.3* 10.2*  HCT 32.3* 31.8*  MCV 87.1 86.4  PLT 121* 117*    Cardiac Enzymes: No results for input(s): CKTOTAL, CKMB, CKMBINDEX, TROPONINI in the last 168 hours.  BNP: BNP (last 3 results)  Recent Labs  01/05/16 0114  BNP  591.1*    ProBNP (last 3 results) No results for input(s): PROBNP in the last 8760 hours.    Other results:  Imaging: No results found.   Medications:     Scheduled Medications: . aspirin  81 mg Oral Daily  . atorvastatin  80 mg Oral QODAY  . carvedilol  3.125 mg Oral BID WC  . clopidogrel  75 mg Oral Daily  . digoxin  0.125 mg Oral Daily  . ezetimibe  10 mg Oral Daily  . furosemide  80 mg Intravenous BID  . heparin  5,000 Units Subcutaneous Q8H  . insulin aspart  0-15 Units Subcutaneous TID WC  . losartan  12.5 mg Oral BID  . metolazone  2.5 mg Oral Daily  . sodium chloride flush  3 mL Intravenous Q12H  . sodium chloride flush  3 mL Intravenous Q12H  . spironolactone  25 mg Oral Daily  . sulfamethoxazole-trimethoprim  1 tablet Oral BID    Infusions: . milrinone 0.125 mcg/kg/min (01/31/16 0600)    PRN Medications: sodium chloride, sodium chloride, acetaminophen, ondansetron (ZOFRAN) IV, oxyCODONE-acetaminophen, sodium chloride flush, sodium  chloride flush   Assessment/Plan/Discussion    1. CAD: 3 vessel CAD S/P PCI/stent LAD & POBA to OM-1 on 1/26.  - Continue ASA, plavix and statin 2. 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 St Jude ICD. Echo 01/06/16 EF 20%, RV down. Admitted post cath with volume overload and low output (CI 1.9). Milrinone and IV lasix started.  JVP remains elevated.  He refused PICC this admission.  - Continue milrinone 0.125 for now while diuresing.   - Continue IV lasix today. Give 5 mg metolazone this am.  - Declines fluid restriction at home. "Just can't drink less than 2L" - Continue digoxin, Coreg, losartan, spironolactone at current doses.   3. PAD 4. Carotid Stenosis 5. Tobacco use, ongoing 6. Hypokalemia - Resolved.  7. Hyponatremia: 127 this am. - Continue free water restriction.  Currently asymptomatic.   - Will give dose of tolvaptan.  8. Recent PNA: Decreased breath sounds on exam, will get CXR.   Length of Stay: 3  Luane School  01/31/2016, 8:13 AM  Advanced Heart Failure Team Pager [phone] (M-F; 7a - 4p)  Please contact [facility] for night-coverage after hours (4p -7a ) and weekends on [email or web address]  Patient seen with PA, agree with the above note.  He is still volume overloaded on exam.  Will continue low dose milrinone today and will give dose of metolazone along with IV Lasix.  Will also give dose of tolvaptan with low sodium.   May be able to let him go home tomorrow, he is very anxious to leave hospital.  Will reassess in am.   Marca Ancona 01/31/2016 9:24 AM

## 2016-01-31 NOTE — Progress Notes (Signed)
CARDIAC REHAB PHASE I   PRE:  Rate/Rhythm: 72 SR    BP: sitting 98/65    SaO2: 96 RA  MODE:  Ambulation: 500 ft   POST:  Rate/Rhythm: 84 SR    BP: sitting 107/65     SaO2: 98 RA  Pt able to ambulate pushing IV pole. C/o legs feeling tight/tired. This is normal for him. Pt actually more receptive to education today. He quoted needing to keep fluid below 2L. We discussed 2000 mg sodium diet. He is more open to changing and asking about certain foods. We also discussed importance of weighing daily. He needs to get new scales. We discussed quality of life. He sts he will continue to think through what he eats and where he can change.  [phone]   Harriet MassonRandi Kristan Antoine Vandermeulen CES, ACSM 01/31/2016 11:50 AM

## 2016-01-31 NOTE — Progress Notes (Signed)
Dr. Mayford Knifeurner (on-call for cardiology) notified via messaging system @ 0430 re: 5 beat run of V-tach.  Pt asymptomatic; BP 93/52 (65), P76.  Notified Dr. Mayford Knifeurner of Na+ 127; K+ WNL 4.3; no Mg++ level.  RN notified @ 0600 that Dr. Mayford Knifeurner had returned call to charge Rn, not primary RN, at unknown time and communicated OK to give Magnesium Sulfate 2gm IV rider.  No order placed in system for IV MagSulfate rider. Still no Mg++ level available.  Order placed for Mg++ to be drawn from blood already in lab; will await result prior to ordering MagSulfate rider.

## 2016-02-01 ENCOUNTER — Inpatient Hospital Stay (HOSPITAL_COMMUNITY): Payer: BLUE CROSS/BLUE SHIELD

## 2016-02-01 DIAGNOSIS — J90 Pleural effusion, not elsewhere classified: Secondary | ICD-10-CM

## 2016-02-01 LAB — COMPREHENSIVE METABOLIC PANEL
ALK PHOS: 166 U/L — AB (ref 38–126)
ALT: 31 U/L (ref 17–63)
AST: 28 U/L (ref 15–41)
Albumin: 3.2 g/dL — ABNORMAL LOW (ref 3.5–5.0)
Anion gap: 10 (ref 5–15)
BILIRUBIN TOTAL: 0.7 mg/dL (ref 0.3–1.2)
BUN: 20 mg/dL (ref 6–20)
CHLORIDE: 94 mmol/L — AB (ref 101–111)
CO2: 24 mmol/L (ref 22–32)
CREATININE: 1.22 mg/dL (ref 0.61–1.24)
Calcium: 8.8 mg/dL — ABNORMAL LOW (ref 8.9–10.3)
GFR calc Af Amer: >60 mL/min (ref >60)
GFR calc non Af Amer: >60 mL/min (ref >60)
Glucose, Bld: 221 mg/dL — ABNORMAL HIGH (ref 65–99)
Potassium: 4 mmol/L (ref 3.5–5.1)
Sodium: 128 mmol/L — ABNORMAL LOW (ref 135–145)
Total Protein: 7.8 g/dL (ref 6.5–8.1)

## 2016-02-01 LAB — BASIC METABOLIC PANEL
Anion gap: 9 (ref 5–15)
BUN: 21 mg/dL — AB (ref 6–20)
CHLORIDE: 96 mmol/L — AB (ref 101–111)
CO2: 24 mmol/L (ref 22–32)
CREATININE: 1.21 mg/dL (ref 0.61–1.24)
Calcium: 8.8 mg/dL — ABNORMAL LOW (ref 8.9–10.3)
GFR calc Af Amer: >60 mL/min (ref >60)
GFR calc non Af Amer: >60 mL/min (ref >60)
GLUCOSE: 176 mg/dL — AB (ref 65–99)
Potassium: 4.4 mmol/L (ref 3.5–5.1)
Sodium: 129 mmol/L — ABNORMAL LOW (ref 135–145)

## 2016-02-01 LAB — GLUCOSE, CAPILLARY
GLUCOSE-CAPILLARY: 206 mg/dL — AB (ref 65–99)
Glucose-Capillary: 153 mg/dL — ABNORMAL HIGH (ref 65–99)
Glucose-Capillary: 222 mg/dL — ABNORMAL HIGH (ref 65–99)
Glucose-Capillary: 231 mg/dL — ABNORMAL HIGH (ref 65–99)

## 2016-02-01 LAB — BODY FLUID CELL COUNT WITH DIFFERENTIAL
Eos, Fluid: 0 %
LYMPHS FL: 35 %
MONOCYTE-MACROPHAGE-SEROUS FLUID: 57 % (ref 50–90)
Neutrophil Count, Fluid: 8 % (ref 0–25)
WBC FLUID: 110 uL (ref 0–1000)

## 2016-02-01 LAB — LACTATE DEHYDROGENASE, PLEURAL OR PERITONEAL FLUID: LD, Fluid: 85 U/L — ABNORMAL HIGH (ref 3–23)

## 2016-02-01 LAB — PROTEIN, BODY FLUID: TOTAL PROTEIN, FLUID: 3.9 g/dL

## 2016-02-01 LAB — CHOLESTEROL, TOTAL: CHOLESTEROL: 111 mg/dL (ref 0–200)

## 2016-02-01 LAB — LACTATE DEHYDROGENASE: LDH: 193 U/L — AB (ref 98–192)

## 2016-02-01 MED ORDER — TOLVAPTAN 15 MG PO TABS
15.0000 mg | ORAL_TABLET | Freq: Once | ORAL | Status: AC
  Administered 2016-02-01: 15 mg via ORAL
  Filled 2016-02-01: qty 1

## 2016-02-01 MED ORDER — METOLAZONE 2.5 MG PO TABS
2.5000 mg | ORAL_TABLET | Freq: Once | ORAL | Status: AC
  Administered 2016-02-01: 2.5 mg via ORAL
  Filled 2016-02-01: qty 1

## 2016-02-01 NOTE — Progress Notes (Signed)
CARDIAC REHAB PHASE I   PRE:  Rate/Rhythm: 77 SR    BP: sitting 105/62    SaO2: 94 RA  MODE:  Ambulation: 850 ft   POST:  Rate/Rhythm: 94 SR    BP: sitting 107/63     SaO2: 94 RA  Pt moving well this pm. Denies SOB. Sts his legs are less tired today and that he has less tightness in his stomach after thoracentesis.  Able to increase distance although cold and tired after walking. Return to bed (refuses chair). Declines questions.  [phone]  Harriet MassonRandi Kristan Shawnee Higham CES, ACSM 02/01/2016 2:17 PM

## 2016-02-01 NOTE — Progress Notes (Signed)
Advanced Heart Failure Rounding Note   Subjective:    Presented 1/26 for scheduled PCI/stent to LAD and POBA to OM1. Admitted due to volume overload with R>L failure.   01/30/16 milrinone dropped to 0.125 mcg/kg/min.  Pt refused PICC so no coox.     Breathing is "much better" today.  Wants to go home. Not nauseated.  Out 3.1 L and down  3 lbs.   CXR 01/31/16 with re-accumulation of large right-sided pleural effusion and + CHF with Kerley B lines.   LHC (1/26) Stent to proximal LAD and PTCA to subtotally occluded large OM1 RA 16 PA 52/24 (35) PCWP 21 CO 4.35 CI 1.92  PVR 3.2 WU  Echo (12/17): EF 20%, moderate LVH, decreased RV function.   Objective:   Weight Range:  Vital Signs:   Temp:  [97.6 F (36.4 C)-98.1 F (36.7 C)] 97.9 F (36.6 C) (01/30 0328) Pulse Rate:  [76-78] 77 (01/30 0328) Resp:  [16-20] 16 (01/30 0328) BP: (89-107)/(57-67) 99/61 (01/30 0328) SpO2:  [93 %-100 %] 93 % (01/30 0328) Weight:  [227 lb (103 kg)] 227 lb (103 kg) (01/30 0328)    Weight change: Filed Weights   01/30/16 0300 01/31/16 0500 02/01/16 0328  Weight: 230 lb 3.2 oz (104.4 kg) 230 lb 13.2 oz (104.7 kg) 227 lb (103 kg)    Intake/Output:   Intake/Output Summary (Last 24 hours) at 02/01/16 0748 Last data filed at 02/01/16 0600  Gross per 24 hour  Intake             93.6 ml  Output             3230 ml  Net          -3136.4 ml     Physical Exam: General: Lying in bed. NAD. Fatigued appearing.  HEENT: Normal Neck: supple. JVP 12 cm. Carotids 2+ bilat; no bruits. No thyromegaly or nodule noted.  Cor: PMI nondisplaced. RRR. 2/6 SEM RUSB, no S3/S4.   Lungs: Diminished R basilar sounds.   Abdomen: soft, NT, ND, no HSM. No bruits or masses. +BS  Extremities: no cyanosis, clubbing, rash, Trace to 1+ ankle edema.   Neuro: alert & orientedx3, cranial nerves grossly intact. moves all 4 extremities w/o difficulty. Affect pleasant  Telemetry: Reviewed, NSR 70s   Labs: Basic  Metabolic Panel:  Recent Labs Lab 01/28/16 0551 01/28/16 1610 01/29/16 0431 01/30/16 0320 01/31/16 0308 02/01/16 0238  NA 136  --  133* 129* 127* 129*  K 3.9  --  3.9 4.4 4.3 4.4  CL 97*  --  98* 97* 93* 96*  CO2 28  --  27 24 22 24   GLUCOSE 91  --  226* 238* 164* 176*  BUN 20  --  17 16 17  21*  CREATININE 1.06 0.98 1.03 1.07 1.10 1.21  CALCIUM 9.1  --  8.5* 8.6* 8.7* 8.8*  MG  --   --   --   --  1.9  --     Liver Function Tests: No results for input(s): AST, ALT, ALKPHOS, BILITOT, PROT, ALBUMIN in the last 168 hours. No results for input(s): LIPASE, AMYLASE in the last 168 hours. No results for input(s): AMMONIA in the last 168 hours.  CBC:  Recent Labs Lab 01/28/16 1610 01/29/16 0431  WBC 4.7 5.0  HGB 10.3* 10.2*  HCT 32.3* 31.8*  MCV 87.1 86.4  PLT 121* 117*    Cardiac Enzymes: No results for input(s): CKTOTAL, CKMB, CKMBINDEX, TROPONINI in the  last 168 hours.  BNP: BNP (last 3 results)  Recent Labs  01/05/16 0114  BNP 591.1*    ProBNP (last 3 results) No results for input(s): PROBNP in the last 8760 hours.    Other results:  Imaging: Dg Chest 2 View  Result Date: 01/31/2016 CLINICAL DATA:  61 year old male with pneumonia and hypertension. Subsequent encounter. EXAM: CHEST  2 VIEW COMPARISON:  01/17/2016 chest x-ray. 01/10/2016 chest x-ray. 01/05/2016 chest CT. FINDINGS: Interval re-accumulation of large right-sided pleural effusion. Limited for evaluating for underlying mass, infiltrate or atelectasis. Cardiomegaly with AICD in place. Pulmonary vascular congestion with Kerley B lines suggesting component of congestive heart failure. No pneumothorax. Calcified aorta. No acute osseous abnormality. IMPRESSION: Interval re-accumulation of large right-sided pleural effusion. Cardiomegaly with AICD in place. Pulmonary vascular congestion with Kerley B lines suggesting component of congestive heart failure. Electronically Signed   By: Lacy Duverney M.D.   On:  01/31/2016 10:30     Medications:     Scheduled Medications: . aspirin  81 mg Oral Daily  . atorvastatin  80 mg Oral QODAY  . carvedilol  3.125 mg Oral BID WC  . clopidogrel  75 mg Oral Daily  . digoxin  0.125 mg Oral Daily  . ezetimibe  10 mg Oral Daily  . furosemide  80 mg Intravenous BID  . heparin  5,000 Units Subcutaneous Q8H  . insulin aspart  0-15 Units Subcutaneous TID WC  . losartan  12.5 mg Oral BID  . sodium chloride flush  3 mL Intravenous Q12H  . sodium chloride flush  3 mL Intravenous Q12H  . spironolactone  25 mg Oral Daily  . sulfamethoxazole-trimethoprim  1 tablet Oral BID    Infusions: . milrinone 0.125 mcg/kg/min (02/01/16 0600)    PRN Medications: sodium chloride, sodium chloride, acetaminophen, ondansetron (ZOFRAN) IV, oxyCODONE-acetaminophen, sodium chloride flush, sodium chloride flush   Assessment/Plan/Discussion    1. CAD: 3 vessel CAD S/P PCI/stent LAD & POBA to OM-1 on 1/26.  - Continue ASA, plavix and statin 2. 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 St Jude ICD. Echo 01/06/16 EF 20%, RV down. Admitted post cath with volume overload and low output (CI 1.9). Milrinone and IV lasix started. Refused picc. - Volume status remains elevated. Creatinine up very slightly.  - Continue milrinone 0.125 while diuresing.    - Continue IV lasix 80 mg BID with 2.5 mg metolazone this am with volume status improving.   - Declines fluid restriction at home. "Just can't drink less than 2L" - Continue digoxin, Coreg, losartan, spironolactone at current doses.   3. PAD 4. Carotid Stenosis 5. Tobacco use, ongoing 6. Hypokalemia - Resolved.  7. Hyponatremia: 129 with dose of tolvaptan yesterday.  - Continue free water restriction.  Currently asymptomatic.   - Will give dose of tolvaptan.  8. Recent PNA:  - Interval re-accumulation of large right-sided pleural effusion on CXR 01/31/16. ? Need for thoracentesis.  Remains  volume overloaded but with good diuresis. Will continue to diurese today as pt now willing to stay.  Can consider R Thoracentesis.   Length of Stay: [address]  Luane School  02/01/2016, 7:48 AM  Advanced Heart Failure Team Pager [phone] (M-F; 7a - 4p)  Please contact [facility] for night-coverage after hours (4p -7a ) and weekends on [email or web address]  Patient seen with PA, agree with the above note.  He diuresed well yesterday, still with some volume overload on exam.  Will continue IV Lasix +  metolazone.  Will give dose of tolvaptan again today.  Creatinine stable.   He has a right pleural effusion, appears large.  Will ask pulmonary to do thoracentesis, send off fluid for studies.   Marca AnconaDalton Clariece Roesler 02/01/2016 8:49 AM

## 2016-02-01 NOTE — Procedures (Signed)
Thoracentesis Procedure Note  Pre-operative Diagnosis:Recuurent right pleural effusion  Post-operative Diagnosis: Same  Indications: Dyspnea   Procedure Details  Consent: Informed consent was obtained. Risks of the procedure were discussed including: infection, bleeding, pain, pneumothorax.  Under sterile conditions the patient was positioned. Betadine solution and sterile drapes were utilized.  1% buffered lidocaine was used to anesthetize the 6th rt  rib space. Fluid was obtained without any difficulties and minimal blood loss.  A dressing was applied to the wound and wound care instructions were provided.   Findings 1800 ml of Straw colored pleural fluid was obtained. A sample was sent to Pathology forcell counts, as well as for infection analysis.  Complications:  None; patient tolerated the procedure well.          Condition: stable  Plan A follow up chest x-ray was ordered. Bed Rest for 2 hours. Tylenol 650 mg. for pain.   Brett CanalesSteve Minor ACNP [facility]Le [facility] Pager [phone] till 3 pm If no answer page [phone] 02/01/2016, 10:23 AM

## 2016-02-02 LAB — BASIC METABOLIC PANEL
Anion gap: 7 (ref 5–15)
Anion gap: 7 (ref 5–15)
BUN: 20 mg/dL (ref 6–20)
BUN: 19 mg/dL (ref 6–20)
CALCIUM: 8.9 mg/dL (ref 8.9–10.3)
CO2: 26 mmol/L (ref 22–32)
CO2: 27 mmol/L (ref 22–32)
Calcium: 8.6 mg/dL — ABNORMAL LOW (ref 8.9–10.3)
Chloride: 96 mmol/L — ABNORMAL LOW (ref 101–111)
Chloride: 98 mmol/L — ABNORMAL LOW (ref 101–111)
Creatinine, Ser: 1.14 mg/dL (ref 0.61–1.24)
Creatinine, Ser: 1.19 mg/dL (ref 0.61–1.24)
GFR calc Af Amer: >60 mL/min (ref >60)
GFR calc Af Amer: >60 mL/min (ref >60)
GLUCOSE: 187 mg/dL — AB (ref 65–99)
GLUCOSE: 173 mg/dL — AB (ref 65–99)
POTASSIUM: 3.9 mmol/L (ref 3.5–5.1)
Potassium: 4.2 mmol/L (ref 3.5–5.1)
SODIUM: 129 mmol/L — AB (ref 135–145)
Sodium: 132 mmol/L — ABNORMAL LOW (ref 135–145)

## 2016-02-02 LAB — GLUCOSE, CAPILLARY
GLUCOSE-CAPILLARY: 158 mg/dL — AB (ref 65–99)
GLUCOSE-CAPILLARY: 168 mg/dL — AB (ref 65–99)
Glucose-Capillary: 165 mg/dL — ABNORMAL HIGH (ref 65–99)
Glucose-Capillary: 185 mg/dL — ABNORMAL HIGH (ref 65–99)

## 2016-02-02 LAB — RHEUMATOID FACTORS, FLUID: Rheumatoid Arthritis, Qn/Fluid: NEGATIVE

## 2016-02-02 LAB — PH, BODY FLUID: pH, Body Fluid: 8

## 2016-02-02 LAB — SODIUM: Sodium: 130 mmol/L — ABNORMAL LOW (ref 135–145)

## 2016-02-02 MED ORDER — METOLAZONE 2.5 MG PO TABS
2.5000 mg | ORAL_TABLET | Freq: Once | ORAL | Status: AC
  Administered 2016-02-02: 2.5 mg via ORAL
  Filled 2016-02-02: qty 1

## 2016-02-02 MED ORDER — TOLVAPTAN 15 MG PO TABS
15.0000 mg | ORAL_TABLET | Freq: Once | ORAL | Status: AC
  Administered 2016-02-02: 15 mg via ORAL
  Filled 2016-02-02: qty 1

## 2016-02-02 MED ORDER — FUROSEMIDE 10 MG/ML IJ SOLN: 40.0000 mg | Freq: Once | INTRAMUSCULAR | Status: DC

## 2016-02-02 NOTE — Progress Notes (Signed)
Inpatient Diabetes Program Recommendations  AACE/ADA: New Consensus Statement on Inpatient Glycemic Control (2015)  Target Ranges:  Prepandial:   less than 140 mg/dL      Peak postprandial:   less than 180 mg/dL (1-2 hours)      Critically ill patients:  140 - 180 mg/dL   Lab Results  Component Value Date   GLUCAP 165 (H) 02/02/2016   HGBA1C 7.6 (H) 01/05/2016    Review of Glycemic ControlResults for Lynda RainwaterFULP, Chinedum D (MRN [id number]) as of 02/02/2016 14:52  Ref. Range 02/01/2016 08:09 02/01/2016 11:08 02/01/2016 16:06 02/01/2016 21:25 02/02/2016 08:55 02/02/2016 12:29  Glucose-Capillary Latest Ref Range: 65 - 99 mg/dL 657153 (H) 846222 (H) 962231 (H) 206 (H) 158 (H) 165 (H)   Diabetes history: Type 2 diabetes Outpatient Diabetes medications: 70/30 32 units in the AM and 16 units q PM Current orders for Inpatient glycemic control:  Novolog moderate tid with meals  Inpatient Diabetes Program Recommendations:   While in the hospital, consider adding Lantus 15 units daily.  Thanks, Beryl MeagerJenny Leialoha Hanna, RN, BC-ADM Inpatient Diabetes Coordinator Pager [phone] (8a-5p)

## 2016-02-02 NOTE — Progress Notes (Signed)
CARDIAC REHAB PHASE I   PRE:  Rate/Rhythm: 72 SR  BP:  Supine:   Sitting: 97/58  Standing:    SaO2: 98%RA  MODE:  Ambulation: 880 ft   POST:  Rate/Rhythm: 87 SR  BP:  Supine:   Sitting: 109/64  Standing:    SaO2: 100%RA 1022-1053 Pt walked 880 ft on RA with steady gait. Talked and walked and only a little DOE noted. He denied SOB. Tolerated well. Back to sitting on side of bed after walk. No complaints.   Luetta Nuttingharlene Karlisa Gaubert, RN BSN  02/02/2016 10:50 AM

## 2016-02-02 NOTE — Progress Notes (Signed)
Advanced Heart Failure Rounding Note   Subjective:    Presented 1/26 for scheduled PCI/stent to LAD and POBA to OM1. Admitted due to volume overload with R>L failure.   01/30/16 milrinone dropped to 0.125 mcg/kg/min.  Pt refused PICC so no coox.     Feeling better today.  Thinks his urine output yesterday was bet it has been this admission. Breathing better after thoracentesis.   Out 2.2 L of UO.  Out 1.8 with thoracentesis.  Weight shows down 9 lbs. Creatinine stable.   CXR 01/31/16 with re-accumulation of large right-sided pleural effusion and + CHF with Kerley B lines.   LHC (1/26) Stent to proximal LAD and PTCA to subtotally occluded large OM1 RA 16 PA 52/24 (35) PCWP 21 CO 4.35 CI 1.92  PVR 3.2 WU  Echo (12/17): EF 20%, moderate LVH, decreased RV function.   Objective:   Weight Range:  Vital Signs:   Temp:  [97.6 F (36.4 C)-98.5 F (36.9 C)] 97.9 F (36.6 C) (01/31 0328) Pulse Rate:  [74-81] 77 (01/30 2311) Resp:  [18-20] 18 (01/31 0328) BP: (99-116)/(62-86) 100/64 (01/31 0328) SpO2:  [96 %-100 %] 97 % (01/31 0328) Weight:  [218 lb 14.4 oz (99.3 kg)] 218 lb 14.4 oz (99.3 kg) (01/31 0328) Last BM Date: 02/01/16  Weight change: Filed Weights   01/31/16 0500 02/01/16 0328 02/02/16 0328  Weight: 230 lb 13.2 oz (104.7 kg) 227 lb (103 kg) 218 lb 14.4 oz (99.3 kg)    Intake/Output:   Intake/Output Summary (Last 24 hours) at 02/02/16 0827 Last data filed at 02/02/16 0600  Gross per 24 hour  Intake           444.11 ml  Output             2500 ml  Net         -2055.89 ml     Physical Exam: General: Sitting on edge of bed. NAD at rest.   HEENT: Normal Neck: supple. JVP 9-10 cm. Carotids 2+ bilat; no bruits. No thyromegaly or nodule noted.  Cor: PMI nondisplaced. RRR. 2/6 SEM RUSB, no S3/S4.   Lungs: Decrease R basilar sounds.    Abdomen: soft, NT, ND, no HSM. No bruits or masses. +BS  Extremities: no cyanosis, clubbing, rash, Trace to 1+ ankle edema.      Neuro: alert & orientedx3, cranial nerves grossly intact. moves all 4 extremities w/o difficulty. Affect pleasant this am.   Telemetry: Reviewed, NSR 70s    Labs: Basic Metabolic Panel:  Recent Labs Lab 01/30/16 0320 01/31/16 0308 02/01/16 0238 02/01/16 1059 02/02/16 0427  NA 129* 127* 129* 128* 129*  K 4.4 4.3 4.4 4.0 3.9  CL 97* 93* 96* 94* 96*  CO2 24 22 24 24 26   GLUCOSE 238* 164* 176* 221* 187*  BUN 16 17 21* 20 20  CREATININE 1.07 1.10 1.21 1.22 1.14  CALCIUM 8.6* 8.7* 8.8* 8.8* 8.6*  MG  --  1.9  --   --   --     Liver Function Tests:  Recent Labs Lab 02/01/16 1059  AST 28  ALT 31  ALKPHOS 166*  BILITOT 0.7  PROT 7.8  ALBUMIN 3.2*   No results for input(s): LIPASE, AMYLASE in the last 168 hours. No results for input(s): AMMONIA in the last 168 hours.  CBC:  Recent Labs Lab 01/28/16 1610 01/29/16 0431  WBC 4.7 5.0  HGB 10.3* 10.2*  HCT 32.3* 31.8*  MCV 87.1 86.4  PLT 121*  117*    Cardiac Enzymes: No results for input(s): CKTOTAL, CKMB, CKMBINDEX, TROPONINI in the last 168 hours.  BNP: BNP (last 3 results)  Recent Labs  01/05/16 0114  BNP 591.1*    ProBNP (last 3 results) No results for input(s): PROBNP in the last 8760 hours.    Other results:  Imaging: Dg Chest 2 View  Result Date: 01/31/2016 CLINICAL DATA:  61 year old male with pneumonia and hypertension. Subsequent encounter. EXAM: CHEST  2 VIEW COMPARISON:  01/17/2016 chest x-ray. 01/10/2016 chest x-ray. 01/05/2016 chest CT. FINDINGS: Interval re-accumulation of large right-sided pleural effusion. Limited for evaluating for underlying mass, infiltrate or atelectasis. Cardiomegaly with AICD in place. Pulmonary vascular congestion with Kerley B lines suggesting component of congestive heart failure. No pneumothorax. Calcified aorta. No acute osseous abnormality. IMPRESSION: Interval re-accumulation of large right-sided pleural effusion. Cardiomegaly with AICD in place. Pulmonary  vascular congestion with Kerley B lines suggesting component of congestive heart failure. Electronically Signed   By: Lacy Duverney M.D.   On: 01/31/2016 10:30   Dg Chest Port 1 View  Result Date: 02/01/2016 CLINICAL DATA:  61 year old male status post thoracentesis. Initial encounter. EXAM: PORTABLE CHEST 1 VIEW COMPARISON:  01/31/2016 chest radiographs and earlier. FINDINGS: Portable AP semi upright view at at 1128 hours. Right pleural effusion appears mildly regressed, with less fluid in the minor fissure. No pneumothorax identified. No pulmonary edema. Some elevation of the right hemidiaphragm now is suspected. Stable cardiac size and mediastinal contours. Calcified aortic atherosclerosis. Stable left chest cardiac AICD. Visualized tracheal air column is within normal limits. No areas of worsening ventilation. IMPRESSION: 1. Regressed right pleural effusion with no pneumothorax. 2. No new cardiopulmonary abnormality. 3.  Calcified aortic atherosclerosis. Electronically Signed   By: Odessa Fleming M.D.   On: 02/01/2016 11:48     Medications:     Scheduled Medications: . aspirin  81 mg Oral Daily  . atorvastatin  80 mg Oral QODAY  . carvedilol  3.125 mg Oral BID WC  . clopidogrel  75 mg Oral Daily  . digoxin  0.125 mg Oral Daily  . ezetimibe  10 mg Oral Daily  . furosemide  80 mg Intravenous BID  . heparin  5,000 Units Subcutaneous Q8H  . insulin aspart  0-15 Units Subcutaneous TID WC  . losartan  12.5 mg Oral BID  . sodium chloride flush  3 mL Intravenous Q12H  . sodium chloride flush  3 mL Intravenous Q12H  . spironolactone  25 mg Oral Daily  . sulfamethoxazole-trimethoprim  1 tablet Oral BID    Infusions: . milrinone 0.125 mcg/kg/min (02/02/16 0600)    PRN Medications: sodium chloride, sodium chloride, acetaminophen, ondansetron (ZOFRAN) IV, oxyCODONE-acetaminophen, sodium chloride flush, sodium chloride flush   Assessment/Plan/Discussion    1. CAD: 3 vessel CAD S/P PCI/stent LAD  & POBA to OM-1 on 1/26.  - Continue ASA, plavix and statin 2. 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 St Jude ICD. Echo 01/06/16 EF 20%, RV down. Admitted post cath with volume overload and low output (CI 1.9). Milrinone and IV lasix started. Refused picc. - Volume status remains elevated but diuresis much improved.  - Creatinine and BUN stable.   - Continue milrinone 0.125 while diuresing.    - Continue IV lasix 80 mg BID with 2.5 mg metolazone this am at least.  May be able to transition to po tonight vs tomorrow am for home.    - Says he will do his best to  limit fluid.  - Continue digoxin, Coreg, losartan, spironolactone at current doses.   3. PAD 4. Carotid Stenosis 5. Tobacco use, ongoing 6. Hypokalemia - Resolved.  7. Hyponatremia:  - Remains at 129 this am. Will give dose of tolvaptan again.  - Continue free water restriction.  Currently asymptomatic.   8. Pleural effusion.  - s/p Thoracentesis with 1.8 L of straw colored fluid removed.  - R side remains decreased.  Discussed importance of getting all the fluid off so he doesn't end up right back in hospital.  He wants to go home but is willing to stay until fully diuresed.   Length of Stay: 5  Luane SchoolMichael Andrew Tillery, PA-C  02/02/2016, 8:27 AM  Advanced Heart Failure Team Pager [phone] (M-F; 7a - 4p)  Please contact [facility] for night-coverage after hours (4p -7a ) and weekends on [email or web address]  Patient seen with PA, agree with the above note.  Thoracentesis yesterday, borderline by Light's but suspect transudate.  There is some residual volume overload.  Creatinine stable.  Weight down 12 lbs.  - Continue Lasix + metolazone + tolvaptan today + milrinone 0.125 today.  - Hopefully to po diuretics, off milrinone and home tomorrow afternoon.   Marca AnconaDalton Sherhonda Gaspar 02/02/2016 9:12 AM

## 2016-02-02 NOTE — Progress Notes (Signed)
Pt refused bed alarm on, educated on patient's safety plan, pt alert and oriented. Will continue to do hourly rounding.

## 2016-02-03 ENCOUNTER — Encounter (HOSPITAL_COMMUNITY): Payer: Self-pay | Admitting: General Practice

## 2016-02-03 LAB — BASIC METABOLIC PANEL
ANION GAP: 9 (ref 5–15)
BUN: 28 mg/dL — ABNORMAL HIGH (ref 6–20)
CALCIUM: 8.6 mg/dL — AB (ref 8.9–10.3)
CO2: 26 mmol/L (ref 22–32)
Chloride: 95 mmol/L — ABNORMAL LOW (ref 101–111)
Creatinine, Ser: 1.52 mg/dL — ABNORMAL HIGH (ref 0.61–1.24)
GFR, EST AFRICAN AMERICAN: 55 mL/min — AB (ref >60)
GFR, EST NON AFRICAN AMERICAN: 48 mL/min — AB (ref >60)
Glucose, Bld: 195 mg/dL — ABNORMAL HIGH (ref 65–99)
Potassium: 4.2 mmol/L (ref 3.5–5.1)
Sodium: 130 mmol/L — ABNORMAL LOW (ref 135–145)

## 2016-02-03 LAB — GLUCOSE, CAPILLARY: GLUCOSE-CAPILLARY: 239 mg/dL — AB (ref 65–99)

## 2016-02-03 MED ORDER — TORSEMIDE 20 MG PO TABS: ORAL_TABLET | ORAL | 3 refills | Status: DC

## 2016-02-03 MED ORDER — LOSARTAN POTASSIUM 25 MG PO TABS: 12.5000 mg | ORAL_TABLET | Freq: Every day | ORAL | 0 refills | Status: DC

## 2016-02-03 MED ORDER — SPIRONOLACTONE 25 MG PO TABS: 25.0000 mg | ORAL_TABLET | Freq: Every day | ORAL | 0 refills | Status: DC

## 2016-02-03 MED ORDER — POTASSIUM CHLORIDE CRYS ER 20 MEQ PO TBCR: 20.0000 meq | EXTENDED_RELEASE_TABLET | Freq: Every day | ORAL | 3 refills | Status: DC

## 2016-02-03 NOTE — Discharge Summary (Signed)
Advanced Heart Failure Team  Discharge Summary   Patient ID: Dalton RainwaterLarry D [name] MRN: [id number], DOB/AGE: Feb 11, 1955 61 y.o. Admit date: 01/28/2016 D/C date:     02/03/2016   Primary Discharge Diagnoses:  1. 3 vessel CAD s/p PCI/stent LAD & POBA to OM-1 on 01/28/16 2. A/C biventricular HF 2/2 ICM with prominent RV failure 3. Hypokalemia 4. Hyponatremia 5. Pleural effusion 6. Tobacco use, ongoing   Hospital Course:   [name] is a 61 y.o. male smoker with history of PAD, CAD, and ischemic cardiomyopathy.  Admitted from cath lab 01/28/16 when presenting for elective 2 vessel PCI of the LAD and obtuse marginal branch of the circumflex. RHC done at same time showed advanced HF with severely elevated right sided heart pressures, low cardiac index, and elevated LVEDP. Otherwise, he tolerated PCI well.   Started on IV lasix and milrinone to augment diuresis with low/marginal cardiac output. Pt refused PICC line. Initially had slow diurese and metolazone added. CXR 01/31/16 with recurrent large R pleural effusion.  Tolvaptan added with hyponatremia in setting of volume overload with further improvement in diuresis.  Hypokalemia corrected with supplementation while diuresing. Again encouraged to stop smoking.    Underwent thoracentesis 02/01/16 with 1.8 L of straw colored fluid removed. Samples sent. Thought to be transudative.   Overall pt diuresed 10.4 L and down 16 lbs from highest weight this admission.  He will be discharged home today in stable condition with very close follow up in HF clinic as below. Milrinone was stopped prior to discharge with patients refusal of PICC placement.   [facility] Stent to proximal LAD and PTCA to subtotally occluded large OM1 RA 16 PA %2/24 (35) PCWP 21 CO 4.35 CI 1.92  PVR 3.2 WU   Discharge Weight Range: 214 lbs Discharge Vitals: Blood pressure 103/61, pulse 74, temperature 98 F (36.7 C), temperature source Oral, resp. rate 18, height 6' (1.829 m),  weight 214 lb 11.2 oz (97.4 kg), SpO2 98 %.  Labs: Lab Results  Component Value Date   WBC 5.0 01/29/2016   HGB 10.2 (L) 01/29/2016   HCT 31.8 (L) 01/29/2016   MCV 86.4 01/29/2016   PLT 117 (L) 01/29/2016    Recent Labs Lab 02/01/16 1059  02/03/16 0120  NA 128*  < > 130*  K 4.0  < > 4.2  CL 94*  < > 95*  CO2 24  < > 26  BUN 20  < > 28*  CREATININE 1.22  < > 1.52*  CALCIUM 8.8*  < > 8.6*  PROT 7.8  --   --   BILITOT 0.7  --   --   ALKPHOS 166*  --   --   ALT 31  --   --   AST 28  --   --   GLUCOSE 221*  < > 195*  < > = values in this interval not displayed. Lab Results  Component Value Date   CHOL 111 02/01/2016   HDL 47 01/12/2014   LDLCALC 53 01/12/2014   TRIG 52 01/12/2014   BNP (last 3 results)  Recent Labs  01/05/16 0114  BNP 591.1*    ProBNP (last 3 results) No results for input(s): PROBNP in the last 8760 hours.   Discharge Medications   Allergies as of 02/03/2016      Reactions   Niacin Itching, Other (See Comments)   Flushing    Metformin And Related Nausea And Vomiting   *Only the extended release*  Medication List    STOP taking these medications   sulfamethoxazole-trimethoprim 800-160 MG tablet Commonly known as:  BACTRIM DS,SEPTRA DS     TAKE these medications   aspirin 81 MG tablet Take 1 tablet (81 mg total) by mouth daily.   atorvastatin 80 MG tablet Commonly known as:  LIPITOR Take 1 tablet (80 mg total) by mouth every other day.   carvedilol 3.125 MG tablet Commonly known as:  COREG Take 1 tablet (3.125 mg total) by mouth 2 (two) times daily.   clopidogrel 75 MG tablet Commonly known as:  PLAVIX Take 1 tablet (75 mg total) by mouth daily.   digoxin 0.125 MG tablet Commonly known as:  LANOXIN Take 1 tablet (0.125 mg total) by mouth daily.   ezetimibe 10 MG tablet Commonly known as:  ZETIA TAKE ONE TABLET BY MOUTH ONCE DAILY   insulin NPH-regular Human (70-30) 100 UNIT/ML injection Commonly known as:  NOVOLIN  70/30 Inject 16-32 Units into the skin 2 (two) times daily with a meal. 32 units in the morning, 16 units in the evening   losartan 25 MG tablet Commonly known as:  COZAAR Take 0.5 tablets (12.5 mg total) by mouth daily. Start taking on:  02/04/2016 What changed:  when to take this   potassium chloride SA 20 MEQ tablet Commonly known as:  K-DUR,KLOR-CON Take 1 tablet (20 mEq total) by mouth daily. Start taking on:  02/04/2016 What changed:  when to take this   spironolactone 25 MG tablet Commonly known as:  ALDACTONE Take 1 tablet (25 mg total) by mouth daily. Start taking on:  02/04/2016   torsemide 20 MG tablet Commonly known as:  DEMADEX Take 4 tabs in AM and 3 tabs in PM What changed:  additional instructions       Disposition   The patient will be discharged in stable condition to home. Discharge Instructions    (HEART FAILURE PATIENTS) Call MD:  Anytime you have any of the following symptoms: 1) 3 pound weight gain in 24 hours or 5 pounds in 1 week 2) shortness of breath, with or without a dry hacking cough 3) swelling in the hands, feet or stomach 4) if you have to sleep on extra pillows at night in order to breathe.    Complete by:  As directed    Diet - low sodium heart healthy    Complete by:  As directed    Increase activity slowly    Complete by:  As directed      Follow-up Information    Marca Ancona, MD Follow up on 02/08/2016.   Specialty:  Cardiology Why:  9:00 Contact information: [address] [place] [address] [phone]             Duration of Discharge Encounter: Greater than 35 minutes   Signed, Graciella Freer NP-C  02/03/2016, 12:33 PM  .

## 2016-02-03 NOTE — Progress Notes (Signed)
Advanced Heart Failure Rounding Note   Subjective:    Presented 1/26 for scheduled PCI/stent to LAD and POBA to OM1. Admitted due to volume overload with R>L failure.   01/30/16 milrinone dropped to 0.125 mcg/kg/min.  Pt refused PICC so no coox.     CXR 01/31/16 with re-accumulation of large right-sided pleural effusion and + CHF with Kerley B lines.   Diuresed IV lasix + tolvaptan + spiro. Creatinine bump noted. 1.19>1.52  Wants to go home. Denies SOB.   LHC (1/26) Stent to proximal LAD and PTCA to subtotally occluded large OM1 RA 16 PA 52/24 (35) PCWP 21 CO 4.35 CI 1.92  PVR 3.2 WU  Echo (12/17): EF 20%, moderate LVH, decreased RV function.   Objective:   Weight Range:  Vital Signs:   Temp:  [97.9 F (36.6 C)-98.4 F (36.9 C)] 98 F (36.7 C) (01/31 2018) Pulse Rate:  [71-77] 74 (02/01 0556) Resp:  [13-20] 18 (02/01 0556) BP: (88-118)/(50-61) 103/61 (02/01 0556) SpO2:  [97 %-100 %] 98 % (02/01 0556) Weight:  [214 lb 11.2 oz (97.4 kg)-217 lb 2.5 oz (98.5 kg)] 214 lb 11.2 oz (97.4 kg) (02/01 0601) Last BM Date: 02/02/16  Weight change: Filed Weights   02/02/16 0328 02/02/16 1659 02/03/16 0601  Weight: 218 lb 14.4 oz (99.3 kg) 217 lb 2.5 oz (98.5 kg) 214 lb 11.2 oz (97.4 kg)    Intake/Output:   Intake/Output Summary (Last 24 hours) at 02/03/16 0826 Last data filed at 02/03/16 0601  Gross per 24 hour  Intake           1311.2 ml  Output             3750 ml  Net          -2438.8 ml     Physical Exam: General: Sitting on the side of the bed. Marland Kitchen   HEENT: Normal Neck: supple. JVP 7-8 cm. Carotids 2+ bilat; no bruits. No thyromegaly or nodule noted.  Cor: PMI nondisplaced. RRR. 2/6 SEM RUSB, no S3/S4.   Lungs: Decreased RLL on room air.     Abdomen: soft, NT, ND, no HSM. No bruits or masses. +BS  Extremities: no cyanosis, clubbing, rash, no edema. Warm. Chronic hyperpigmentation.     Neuro: alert & orientedx3, cranial nerves grossly intact. moves all 4  extremities w/o difficulty. Affect pleasant this am.   Telemetry: NSR 70s    Labs: Basic Metabolic Panel:  Recent Labs Lab 01/31/16 0308 02/01/16 0238 02/01/16 1059 02/02/16 0427 02/02/16 0954 02/02/16 1810 02/03/16 0120  NA 127* 129* 128* 129* 132* 130* 130*  K 4.3 4.4 4.0 3.9 4.2  --  4.2  CL 93* 96* 94* 96* 98*  --  95*  CO2 22 24 24 26 27   --  26  GLUCOSE 164* 176* 221* 187* 173*  --  195*  BUN 17 21* 20 20 19   --  28*  CREATININE 1.10 1.21 1.22 1.14 1.19  --  1.52*  CALCIUM 8.7* 8.8* 8.8* 8.6* 8.9  --  8.6*  MG 1.9  --   --   --   --   --   --     Liver Function Tests:  Recent Labs Lab 02/01/16 1059  AST 28  ALT 31  ALKPHOS 166*  BILITOT 0.7  PROT 7.8  ALBUMIN 3.2*   No results for input(s): LIPASE, AMYLASE in the last 168 hours. No results for input(s): AMMONIA in the last 168 hours.  CBC:  Recent Labs Lab 01/28/16 1610 01/29/16 0431  WBC 4.7 5.0  HGB 10.3* 10.2*  HCT 32.3* 31.8*  MCV 87.1 86.4  PLT 121* 117*    Cardiac Enzymes: No results for input(s): CKTOTAL, CKMB, CKMBINDEX, TROPONINI in the last 168 hours.  BNP: BNP (last 3 results)  Recent Labs  01/05/16 0114  BNP 591.1*    ProBNP (last 3 results) No results for input(s): PROBNP in the last 8760 hours.    Other results:  Imaging: Dg Chest Port 1 View  Result Date: 02/01/2016 CLINICAL DATA:  61 year old male status post thoracentesis. Initial encounter. EXAM: PORTABLE CHEST 1 VIEW COMPARISON:  01/31/2016 chest radiographs and earlier. FINDINGS: Portable AP semi upright view at at 1128 hours. Right pleural effusion appears mildly regressed, with less fluid in the minor fissure. No pneumothorax identified. No pulmonary edema. Some elevation of the right hemidiaphragm now is suspected. Stable cardiac size and mediastinal contours. Calcified aortic atherosclerosis. Stable left chest cardiac AICD. Visualized tracheal air column is within normal limits. No areas of worsening  ventilation. IMPRESSION: 1. Regressed right pleural effusion with no pneumothorax. 2. No new cardiopulmonary abnormality. 3.  Calcified aortic atherosclerosis. Electronically Signed   By: Odessa Fleming M.D.   On: 02/01/2016 11:48     Medications:     Scheduled Medications: . aspirin  81 mg Oral Daily  . atorvastatin  80 mg Oral QODAY  . carvedilol  3.125 mg Oral BID WC  . clopidogrel  75 mg Oral Daily  . digoxin  0.125 mg Oral Daily  . ezetimibe  10 mg Oral Daily  . heparin  5,000 Units Subcutaneous Q8H  . insulin aspart  0-15 Units Subcutaneous TID WC  . losartan  12.5 mg Oral BID  . sodium chloride flush  3 mL Intravenous Q12H  . sodium chloride flush  3 mL Intravenous Q12H  . spironolactone  25 mg Oral Daily  . sulfamethoxazole-trimethoprim  1 tablet Oral BID    Infusions: . milrinone 0.125 mcg/kg/min (02/02/16 0847)    PRN Medications: sodium chloride, sodium chloride, acetaminophen, ondansetron (ZOFRAN) IV, oxyCODONE-acetaminophen, sodium chloride flush, sodium chloride flush   Assessment/Plan/Discussion    1. CAD: 3 vessel CAD S/P PCI/stent LAD & POBA to OM-1 on 1/26.  - Continue ASA, plavix and statin 2. 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 St Jude ICD. Echo 01/06/16 EF 20%, RV down. Admitted post cath with volume overload and low output (CI 1.9). Milrinone and IV lasix started. Refused picc. - Volume status stable. Stop IV lasix. Stop milrinone. Overall diuresed 16 pounds.  _ Hold diuretics today. Creatinine up from  1.2>1.5 . Start torsemide 80 mg in am and 60 mg in pm tomorrow.   - Continue milrinone 0.125 while diuresing.    - Continue digoxin, Coreg, losartan,  -Hold spiro and hold losartan. .    -Lengthy discussion about daily weights.  3. PAD 4. Carotid Stenosis 5. Tobacco use, ongoing 6. Hypokalemia - Resolved.  7. Hyponatremia:  - 130. - Continue free water restriction.  Currently asymptomatic.   8. Pleural effusion.    - s/p Thoracentesis on 1/30 with 1.8 L of straw colored fluid removed.  - R side remains decreased.  D/C today.   Length of Stay: 6  Amy Clegg, NP  02/03/2016, 8:26 AM  Advanced Heart Failure Team Pager [phone] (M-F; 7a - 4p)  Please contact [facility] for night-coverage after hours (4p -7a ) and weekends on [email or web address]  Patient seen  with NP, agree with the above note.  Weight down 16 lbs total.  Creatinine higher today.  Will hold losartan, spironolactone, and diuretics today.  Restart meds tomorrow, will use torsemide 80 qam/60 qpm for home. He can go home today with close followup.   Marca AnconaDalton Ronn Smolinsky 02/03/2016 9:30 AM

## 2016-02-03 NOTE — Progress Notes (Signed)
Pt has orders to be discharged. Discharge instructions given and pt has no additional questions at this time. Medication regimen reviewed and pt educated. Pt verbalized understanding and has no additional questions. Telemetry box removed. IV removed and site in good condition. Pt stable and waiting for transportation.   Ileah Falkenstein RN 

## 2016-02-04 LAB — BODY FLUID CULTURE
Culture: NO GROWTH
Gram Stain: NONE SEEN
SPECIAL REQUESTS: NORMAL

## 2016-02-04 LAB — ADENOSIDE DEAMINASE, PLEURAL FL: ADENOSIDE DEAMINASE, PLEURAL FL: <1.6 U/L (ref 0.0–9.4)

## 2016-02-04 LAB — CHOLESTEROL, BODY FLUID: CHOL FL: 50 mg/dL

## 2016-02-08 ENCOUNTER — Ambulatory Visit (INDEPENDENT_AMBULATORY_CARE_PROVIDER_SITE_OTHER): Payer: BLUE CROSS/BLUE SHIELD | Admitting: *Deleted

## 2016-02-08 ENCOUNTER — Encounter (HOSPITAL_COMMUNITY): Payer: Self-pay

## 2016-02-08 ENCOUNTER — Encounter: Payer: Self-pay | Admitting: Internal Medicine

## 2016-02-08 ENCOUNTER — Ambulatory Visit (HOSPITAL_COMMUNITY)
Admission: RE | Admit: 2016-02-08 | Discharge: 2016-02-08 | Disposition: A | Discharge status: Home / Self Care | Payer: BLUE CROSS/BLUE SHIELD | Source: Ambulatory Visit | Attending: Internal Medicine | Admitting: Internal Medicine

## 2016-02-08 VITALS — BP 96/56 | HR 82 | Wt 208.8 lb

## 2016-02-08 DIAGNOSIS — I252 Old myocardial infarction: Secondary | ICD-10-CM | POA: Diagnosis not present

## 2016-02-08 DIAGNOSIS — I739 Peripheral vascular disease, unspecified: Secondary | ICD-10-CM | POA: Diagnosis not present

## 2016-02-08 DIAGNOSIS — Z7982 Long term (current) use of aspirin: Secondary | ICD-10-CM | POA: Insufficient documentation

## 2016-02-08 DIAGNOSIS — Z951 Presence of aortocoronary bypass graft: Secondary | ICD-10-CM | POA: Insufficient documentation

## 2016-02-08 DIAGNOSIS — E785 Hyperlipidemia, unspecified: Secondary | ICD-10-CM | POA: Insufficient documentation

## 2016-02-08 DIAGNOSIS — I6529 Occlusion and stenosis of unspecified carotid artery: Secondary | ICD-10-CM | POA: Diagnosis not present

## 2016-02-08 DIAGNOSIS — Z794 Long term (current) use of insulin: Secondary | ICD-10-CM | POA: Diagnosis not present

## 2016-02-08 DIAGNOSIS — I10 Essential (primary) hypertension: Secondary | ICD-10-CM

## 2016-02-08 DIAGNOSIS — Z95828 Presence of other vascular implants and grafts: Secondary | ICD-10-CM | POA: Insufficient documentation

## 2016-02-08 DIAGNOSIS — Z7902 Long term (current) use of antithrombotics/antiplatelets: Secondary | ICD-10-CM | POA: Diagnosis not present

## 2016-02-08 DIAGNOSIS — I2510 Atherosclerotic heart disease of native coronary artery without angina pectoris: Secondary | ICD-10-CM

## 2016-02-08 DIAGNOSIS — I6523 Occlusion and stenosis of bilateral carotid arteries: Secondary | ICD-10-CM

## 2016-02-08 DIAGNOSIS — I5022 Chronic systolic (congestive) heart failure: Secondary | ICD-10-CM | POA: Diagnosis not present

## 2016-02-08 DIAGNOSIS — Z955 Presence of coronary angioplasty implant and graft: Secondary | ICD-10-CM | POA: Insufficient documentation

## 2016-02-08 DIAGNOSIS — I255 Ischemic cardiomyopathy: Secondary | ICD-10-CM | POA: Insufficient documentation

## 2016-02-08 DIAGNOSIS — I110 Hypertensive heart disease with heart failure: Secondary | ICD-10-CM | POA: Diagnosis not present

## 2016-02-08 DIAGNOSIS — J90 Pleural effusion, not elsewhere classified: Secondary | ICD-10-CM

## 2016-02-08 DIAGNOSIS — E1151 Type 2 diabetes mellitus with diabetic peripheral angiopathy without gangrene: Secondary | ICD-10-CM | POA: Insufficient documentation

## 2016-02-08 LAB — BASIC METABOLIC PANEL
ANION GAP: 11 (ref 5–15)
BUN: 47 mg/dL — ABNORMAL HIGH (ref 6–20)
CALCIUM: 9.7 mg/dL (ref 8.9–10.3)
CO2: 30 mmol/L (ref 22–32)
Chloride: 91 mmol/L — ABNORMAL LOW (ref 101–111)
Creatinine, Ser: 1.23 mg/dL (ref 0.61–1.24)
GFR calc non Af Amer: >60 mL/min (ref >60)
GLUCOSE: 185 mg/dL — AB (ref 65–99)
POTASSIUM: 4.2 mmol/L (ref 3.5–5.1)
Sodium: 132 mmol/L — ABNORMAL LOW (ref 135–145)

## 2016-02-08 LAB — CBC
HCT: 37.3 % — ABNORMAL LOW (ref 39.0–52.0)
HEMOGLOBIN: 12.4 g/dL — AB (ref 13.0–17.0)
MCH: 28.8 pg (ref 26.0–34.0)
MCHC: 33.2 g/dL (ref 30.0–36.0)
MCV: 86.5 fL (ref 78.0–100.0)
PLATELETS: 184 10*3/uL (ref 150–400)
RBC: 4.31 MIL/uL (ref 4.22–5.81)
RDW: 18.4 % — ABNORMAL HIGH (ref 11.5–15.5)
WBC: 8.5 10*3/uL (ref 4.0–10.5)

## 2016-02-08 LAB — BRAIN NATRIURETIC PEPTIDE: B Natriuretic Peptide: 321.5 pg/mL — ABNORMAL HIGH (ref 0.0–100.0)

## 2016-02-08 NOTE — Patient Instructions (Addendum)
Routine lab work today. Will notify you of abnormal results, otherwise no news is good news!  No changes to medication today.  [facility]. Address: [address], [place], [place] [address]  Phone: [phone]  Follow up 3-4 weeks with Dr. Shirlee LatchMcLean.  Do the following things EVERYDAY: 1) Weigh yourself in the morning before breakfast. Write it down and keep it in a log. 2) Take your medicines as prescribed 3) Eat low salt foods-Limit salt (sodium) to 2000 mg per day.  4) Stay as active as you can everyday 5) Limit all fluids for the day to less than 2 liters

## 2016-02-08 NOTE — Progress Notes (Signed)
Remote ICD transmission.   

## 2016-02-08 NOTE — Progress Notes (Addendum)
Advanced Heart Failure Clinic Note   PCP: Dr. Sigmund Hazel Cardiology: Dr. Jens Som HF Cardiology: Dr. Shirlee Latch  61 yo smoker with history of PAD, CAD, and ischemic cardiomyopathy presents for CHF clinic evaluation after recent admission.  Patient was admitted in 1/18 with PNA and acute on chronic systolic CHF.  He was markedly volume overloaded and also had low cardiac output requiring dobutamine use.  He had a right pleural effusion and underwent thoracentesis.  Fluid studies suggested a parapneumonic effusion.  He had cardiac cath, showing 3 vessel disease and markedly elevated filling pressures, right and left.  He was evaluated for CABG but turned down given comorbidities and lack of venous conduits.  We were able to wean him off dobutamine and discharged him with plan for outpatient PCI once he had recovered from the current hospitalization.   Admitted 01/28/16 - 02/03/16 after presenting for elective 2 vessel PCI of the LAD and OM of the circumflex. RHC at same time showed advanced HF with low cardiac index and severely elevated pressures. Diuresed with IV lasix, metolazone, and milrinone, but refused PICC line to follow coox/CVP.  Underwent thoracentesis 02/01/16 with 1.8 L out. Overall diuresed 16 lbs. Discharge weight 214 lbs.  He presents today for post hospital follow up. Feeling better than he expected over all.  Weight at home ~ 200 lbs since discharge.  Breathing has been "fine". Watching sodium and fluid intake "more closely than ever before" but still feels like he goes over sometimes.  Occasionally gets SOB walking up the driveway which has some incline.  Neuropathy is more limiting, causes some balance issues.  No orthopnea/PND/chest pain. He feels like his "circulation" is much better after PCI, feeling more warm and has more energy.   Corevue: Thoracic impedence above threshold, No VT VF.   R/LHC 01/28/16 Stent to proximal LAD and PTCA to subtotally occluded large OM1 RA 16 PA %2/24  (35) PCWP 21 CO 4.35 CI 1.92  PVR 3.2 WU  Labs (1/18): K 4.2, creatinine 0.9, hgb 12.6  ECG (1/18): NSR, old anterior infarct, narrow QRS  PMH: 1. PAD: Left fem-pop bypass with graft in 2017.  2. CAD: Anterior MI 2005 => PCI to LAD followed by staged PCI to LCx and RCA.   - LHC (1/18): Diffuse up to 80% calcified stenosis throughout the proximal LAD.  Diffuse disease in distal LAD up to 50%.  Large OM1 totally occluded at the ostium but reconstituted via collaterals.  80% distal RCA stenosis involving the ostia of the PLV and PDA with about 70% stenosis.  3. Chronic systolic CHF: Ischemic cardiomyopathy.  St Jude ICD.  - Echo (1/18) with EF 20%, mildly decreased RV systolic function.  - RHC (1/18) with mean RA 21, PA 69/31 mean 45, mean PCWP 27, CI 2.37, PVR 3.3 WU.  4. Carotid stenosis: Carotid dopplers (8/16) with 60-79% RICA, 40-59% LICA.  5. Type II diabetes. 6. Hyperlipidemia. 7. HTN 8. Active smoker.  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  Smoking around 1 ppd.  Occasional ETOH.   Family History  Problem Relation Age of Onset  . Diabetes Mother   . Coronary artery disease Father   . Heart disease Father     before age 78   ROS: All systems reviewed and negative except as per HPI.   Current Outpatient Prescriptions  Medication Sig Dispense Refill  . aspirin 81 MG tablet Take 1 tablet (81 mg total) by mouth daily. 30 tablet 3  .  atorvastatin (LIPITOR) 80 MG tablet Take 1 tablet (80 mg total) by mouth every other day. 30 tablet 11  . carvedilol (COREG) 3.125 MG tablet Take 1 tablet (3.125 mg total) by mouth 2 (two) times daily. 60 tablet 3  . clopidogrel (PLAVIX) 75 MG tablet Take 1 tablet (75 mg total) by mouth daily. 30 tablet 6  . digoxin (LANOXIN) 0.125 MG tablet Take 1 tablet (0.125 mg total) by mouth daily. 30 tablet 0  . ezetimibe (ZETIA) 10 MG tablet TAKE ONE TABLET BY MOUTH ONCE DAILY 30 tablet 6  . insulin NPH-regular Human (NOVOLIN 70/30)  (70-30) 100 UNIT/ML injection Inject 16-32 Units into the skin 2 (two) times daily with a meal. 32 units in the morning, 16 units in the evening    . losartan (COZAAR) 25 MG tablet Take 0.5 tablets (12.5 mg total) by mouth daily. 30 tablet 0  . potassium chloride SA (K-DUR,KLOR-CON) 20 MEQ tablet Take 1 tablet (20 mEq total) by mouth daily. 60 tablet 3  . spironolactone (ALDACTONE) 25 MG tablet Take 1 tablet (25 mg total) by mouth daily. 30 tablet 0  . torsemide (DEMADEX) 20 MG tablet Take 4 tabs in AM and 3 tabs in PM 210 tablet 3   No current facility-administered medications for this encounter.    BP (!) 96/56 (BP Location: Left Arm, Patient Position: Sitting, Cuff Size: Normal)   Pulse 82   Wt 208 lb 12.8 oz (94.7 kg)   SpO2 95%   BMI 28.32 kg/m    Wt Readings from Last 3 Encounters:  02/08/16 208 lb 12.8 oz (94.7 kg)  02/03/16 214 lb 11.2 oz (97.4 kg)  01/26/16 230 lb (104.3 kg)    General: NAD Neck: JVP 9-10 cm, no thyromegaly or thyroid nodule.  Lungs: Diminished R lung sounds 1/2 - 2/3 up lung field.  CV: Nondisplaced PMI.  Heart regular S1/S2, no S3/S4, 2/6 early SEM RUSB.  No carotid bruit.  Unable to palpate pedal pulses. Abdomen: Soft, NT, ND, no HSM. No bruits or masses. +BS  Skin: Chronic, lower leg discoloration suggestive of venous stasis dermatitis. Trace edema. Neurologic: Alert and oriented x 3.  Psych: Normal affect. Extremities: No clubbing or cyanosis.  HEENT: Normal.   Assessment/Plan: 1. Chronic systolic CHF: Ischemic cardiomyopathy.  Echo (1/18) with EF 20%, mildly decreased RV systolic function.  Significant component of RV failure.  St Jude ICD.  - Volume status stable on exam and corevue.    - Continue torsemide to 80 qam/60 qpm and KCl 20 mg daily. BMET and BNP today.   - Continue digoxin 0.125 mg daily, Level 0.5 01/18/2016   - Continue losartan 12.5 mg daily  - Continue spironolactone 25 mg daily. - Tolerating coreg 3.125 mg BID.   - Concern for  his overall trajectory with multiple admissions requiring inotrope support. May be approaching need for advanced support.  2. CAD: 3 vessel CAD, see PMH section for description of last cath.   - s/p Stent to proximal LAD and PTCA to subtotally occluded large OM1 - Continue plavix 75 mg daily. Continue ASA 81 and atorvastatin 80 mg daily.   - Refer to cardiac rehab.  3. PAD: Stable claudication (manifested as leg weakness).  - He follows with [facility]. No change to current plans.  4. Carotid stenosis: - Due for carotid dopplers. Will order today.   5. R Pleural effusion - R lung sounds remain dull.  Continue to follow. Symptoms much improved after recent thoracentesis.  Labs today. Follow up 3-4 weeks with Dr. Shirlee Latch. Have ordered his repeat dopplers.  Pressures too soft for up-titration today.   Graciella Freer, PA-C  02/08/2016  Total time spent > 25 minutes. Over half that time spent discussing the above.

## 2016-02-08 NOTE — Progress Notes (Signed)
Advanced Heart Failure Medication Review by a Pharmacist  Does the patient  feel that his/her medications are working for him/her?  yes  Has the patient been experiencing any side effects to the medications prescribed?  no  Does the patient measure his/her own blood pressure or blood glucose at home?  yes   Does the patient have any problems obtaining medications due to transportation or finances?   no  Understanding of regimen: good Understanding of indications: good Potential of compliance: good Patient understands to avoid NSAIDs. Patient understands to avoid decongestants.  Issues to address at subsequent visits: None   Pharmacist comments: [name] is a pleasant 61 yo M presenting with a recent discharge medication list. Patient was recently discharged from hospital and all medications have been reviewed. He reports good compliance with his regimen and did not have any specific medication-related questions or concerns for me at this time.   [name], PharmD, BCPS, CPP Clinical Pharmacist Pager: [phone] Phone: [phone] 02/08/2016 9:22 AM      Time with patient: 10 minutes Preparation and documentation time: 2 minutes Total time: 12 minutes

## 2016-02-08 NOTE — Progress Notes (Signed)
[place] paperwork completed on behalf of patient for period starting 01/05/2016 (date hospitalized)--TBD. Paperwork with supporting/requested documentation faxed to provided # [id number], original forms with supporting/requested documentation given to patient during today's OV. Copy of forms scanned into patient's electronic medical record. Claim # [id number]  [name], Brigida Scotti [name], RN

## 2016-02-09 ENCOUNTER — Other Ambulatory Visit: Payer: Self-pay | Admitting: Internal Medicine

## 2016-02-11 ENCOUNTER — Encounter (INDEPENDENT_AMBULATORY_CARE_PROVIDER_SITE_OTHER): Payer: Self-pay

## 2016-02-11 ENCOUNTER — Ambulatory Visit (INDEPENDENT_AMBULATORY_CARE_PROVIDER_SITE_OTHER): Payer: BLUE CROSS/BLUE SHIELD | Admitting: *Deleted

## 2016-02-11 DIAGNOSIS — I255 Ischemic cardiomyopathy: Secondary | ICD-10-CM

## 2016-02-11 DIAGNOSIS — I5022 Chronic systolic (congestive) heart failure: Secondary | ICD-10-CM

## 2016-02-11 LAB — CUP PACEART INCLINIC DEVICE CHECK
HighPow Impedance: 49.7898
Implantable Lead Location: 753860
Implantable Lead Model: 7001
Implantable Pulse Generator Implant Date: 20141215
Lead Channel Pacing Threshold Amplitude: 0.75 V
Lead Channel Pacing Threshold Pulse Width: 0.5 ms
Lead Channel Setting Pacing Amplitude: 2.5 V
Lead Channel Setting Pacing Pulse Width: 0.5 ms
MDC IDC LEAD IMPLANT DT: 20070207
MDC IDC MSMT LEADCHNL RV IMPEDANCE VALUE: 462.5 Ohm
MDC IDC MSMT LEADCHNL RV PACING THRESHOLD AMPLITUDE: 0.75 V
MDC IDC MSMT LEADCHNL RV PACING THRESHOLD PULSEWIDTH: 0.5 ms
MDC IDC MSMT LEADCHNL RV SENSING INTR AMPL: 11.7 mV
MDC IDC PG SERIAL: 7155171
MDC IDC SESS DTM: 20180209110245
MDC IDC SET LEADCHNL RV SENSING SENSITIVITY: 0.5 mV
MDC IDC STAT BRADY RV PERCENT PACED: 0 %

## 2016-02-11 NOTE — Addendum Note (Signed)
Addended by: Melvern SampleATES-LAND, Keonte Daubenspeck M on: 02/11/2016 11:41 AM   Modules accepted: Level of Service

## 2016-02-11 NOTE — Progress Notes (Signed)
ICD check in clinic. Normal device function. Threshold and sensing consistent with previous device measurements. Impedance trends stable over time. No evidence of any ventricular arrhythmias. Histogram distribution appropriate for patient and level of activity. Stable thoracic impedance. No changes made this session. Device programmed at appropriate safety margins. Device programmed to optimize intrinsic conduction. Estimated longevity 6.3 years. Pt will follow up with [facility] in 3 months. Patient education completed including shock plan. Vibration demonstrated for patient.

## 2016-02-14 ENCOUNTER — Telehealth (HOSPITAL_COMMUNITY): Payer: Self-pay

## 2016-02-14 NOTE — Telephone Encounter (Signed)
-----   Message from Laurey Moralealton S McLean, MD sent at 02/13/2016  8:57 PM EST ----- agree ----- Message ----- From: Dolores Pattyaniel R Bensimhon, MD Sent: 02/12/2016   5:08 PM To: Laurey Moralealton S McLean, MD, Kiaja Shorty D Maleia Weems  Yes. That would be great. He has been followed by Dr. Shirlee LatchMclean who is cc'd.  ----- Message ----- From: Paulita FujitaAmber D Beautifull Cisar Sent: 02/11/2016  11:22 AM To: Dolores Pattyaniel R Bensimhon, MD  The listed patient is interested in cardiac rehab. Is this patient appropriate for out-patient cardiac rehab?    Thank you  Kerr-McGeember Demri Poulton

## 2016-02-14 NOTE — Telephone Encounter (Signed)
-----   Message from Dolores Pattyaniel R Bensimhon, MD sent at 02/12/2016  5:08 PM EST ----- Yes. That would be great. He has been followed by Dr. Shirlee LatchMclean who is cc'd.  ----- Message ----- From: Paulita FujitaAmber D Taje Tondreau Sent: 02/11/2016  11:22 AM To: Dolores Pattyaniel R Bensimhon, MD  The listed patient is interested in cardiac rehab. Is this patient appropriate for out-patient cardiac rehab?    Thank you  Kerr-McGeember Chinedum Vanhouten

## 2016-02-16 ENCOUNTER — Ambulatory Visit (HOSPITAL_COMMUNITY)
Admission: RE | Admit: 2016-02-16 | Discharge: 2016-02-16 | Disposition: A | Discharge status: Home / Self Care | Payer: BLUE CROSS/BLUE SHIELD | Source: Ambulatory Visit | Attending: Cardiology | Admitting: Cardiology

## 2016-02-16 DIAGNOSIS — E785 Hyperlipidemia, unspecified: Secondary | ICD-10-CM | POA: Insufficient documentation

## 2016-02-16 DIAGNOSIS — I739 Peripheral vascular disease, unspecified: Secondary | ICD-10-CM | POA: Insufficient documentation

## 2016-02-16 DIAGNOSIS — I10 Essential (primary) hypertension: Secondary | ICD-10-CM | POA: Diagnosis not present

## 2016-02-16 DIAGNOSIS — E119 Type 2 diabetes mellitus without complications: Secondary | ICD-10-CM | POA: Insufficient documentation

## 2016-02-16 DIAGNOSIS — I5022 Chronic systolic (congestive) heart failure: Secondary | ICD-10-CM

## 2016-02-16 DIAGNOSIS — Z720 Tobacco use: Secondary | ICD-10-CM | POA: Insufficient documentation

## 2016-02-16 DIAGNOSIS — I2510 Atherosclerotic heart disease of native coronary artery without angina pectoris: Secondary | ICD-10-CM | POA: Diagnosis not present

## 2016-02-16 DIAGNOSIS — I6523 Occlusion and stenosis of bilateral carotid arteries: Secondary | ICD-10-CM | POA: Diagnosis not present

## 2016-02-18 ENCOUNTER — Other Ambulatory Visit (HOSPITAL_COMMUNITY): Payer: Self-pay | Admitting: Cardiology

## 2016-02-18 MED ORDER — LOSARTAN POTASSIUM 25 MG PO TABS: 12.5000 mg | ORAL_TABLET | Freq: Every day | ORAL | 11 refills | Status: DC

## 2016-02-18 MED ORDER — DIGOXIN 125 MCG PO TABS: 0.1250 mg | ORAL_TABLET | Freq: Every day | ORAL | 11 refills | Status: DC

## 2016-02-24 ENCOUNTER — Telehealth (HOSPITAL_COMMUNITY): Payer: Self-pay | Admitting: Family Medicine

## 2016-02-24 NOTE — Telephone Encounter (Signed)
I called and left message on patient voicemail to call me to go over insurance benefits for Cardiac Rehab.

## 2016-02-24 NOTE — Telephone Encounter (Signed)
I verified patient [place] insurance: No co-payment, Deductible is $1750.00/deductible has been met, out of pocket max $6850.00/out of pocket max has been met and co-insurance is 25%, and 36 visits covered. Passport/reference # [id number]

## 2016-02-24 NOTE — Telephone Encounter (Signed)
Patient returned called and I reviewed insurance information. I went over insurance benefits with patient. No co-payment, Deductible is $1750.00/deductible has been met, out of pocket max $6850.00/out of pocket max has been met and co-insurance is 25%, and 36 visits covered. Patient verbalized understanding.

## 2016-03-07 ENCOUNTER — Encounter (HOSPITAL_COMMUNITY)
Admission: RE | Admit: 2016-03-07 | Discharge: 2016-03-07 | Disposition: A | Discharge status: Home / Self Care | Payer: BLUE CROSS/BLUE SHIELD | Source: Ambulatory Visit | Attending: Cardiovascular Disease | Admitting: Cardiovascular Disease

## 2016-03-07 ENCOUNTER — Encounter: Payer: Self-pay | Admitting: Cardiovascular Disease

## 2016-03-07 VITALS — BP 120/71 | HR 91 | Ht 72.0 in | Wt 224.4 lb

## 2016-03-07 DIAGNOSIS — Z955 Presence of coronary angioplasty implant and graft: Secondary | ICD-10-CM

## 2016-03-07 DIAGNOSIS — I2510 Atherosclerotic heart disease of native coronary artery without angina pectoris: Secondary | ICD-10-CM | POA: Insufficient documentation

## 2016-03-07 NOTE — Progress Notes (Signed)
Cardiac Rehab Medication Review by a Pharmacist  Does the patient  feel that his/her medications are working for him/her?  Yes  Has the patient been experiencing any side effects to the medications prescribed?  Yes - occasional vision changes  Does the patient measure his/her own blood pressure or blood glucose at home?  Yes   Does the patient have any problems obtaining medications due to transportation or finances?   no  Understanding of regimen: fair Understanding of indications: fair Potential of compliance: fair   Pharmacist comments: Pt presents for initial cardiac rehab visit. Endorses occasional vision changes, PCP is following. Pt reports taking his Coreg once daily rather than BID due to difficulty splitting, encouraged to discuss with MD. Pt reports that he checks his BP occasionally at work, and BG sporadically - encouraged to monitor BG daily at least and BP regularly.   Fredonia HighlandMichael Felica Chargois, PharmD PGY-1 Pharmacy Resident Pager: [phone] 03/07/2016

## 2016-03-07 NOTE — Progress Notes (Signed)
Cardiac Individual Treatment Plan  Patient Details  Name: [name] MRN: [id number] Date of Birth: July 22, 1955 Referring Provider:   Flowsheet Row CARDIAC REHAB PHASE II ORIENTATION from 03/07/2016 in [facility] CARDIAC REHAB  Referring Provider  Marca Ancona MD      Initial Encounter Date:  Flowsheet Row CARDIAC REHAB PHASE II ORIENTATION from 03/07/2016 in [facility] CARDIAC REHAB  Date  03/07/16  Referring Provider  Marca Ancona MD      Visit Diagnosis: Stented coronary artery  Patient's Home Medications on Admission:  Current Outpatient Prescriptions:  .  aspirin 81 MG tablet, Take 1 tablet (81 mg total) by mouth daily., Disp: 30 tablet, Rfl: 3 .  atorvastatin (LIPITOR) 80 MG tablet, Take 1 tablet (80 mg total) by mouth every other day., Disp: 30 tablet, Rfl: 11 .  carvedilol (COREG) 3.125 MG tablet, Take 1 tablet (3.125 mg total) by mouth 2 (two) times daily. (Patient taking differently: Take 3.125 mg by mouth 2 (two) times daily. Pt reports taking entire 6.25mg  tablet once daily), Disp: 60 tablet, Rfl: 3 .  clopidogrel (PLAVIX) 75 MG tablet, Take 1 tablet (75 mg total) by mouth daily., Disp: 30 tablet, Rfl: 6 .  digoxin (LANOXIN) 0.125 MG tablet, Take 1 tablet (0.125 mg total) by mouth daily., Disp: 30 tablet, Rfl: 11 .  ezetimibe (ZETIA) 10 MG tablet, TAKE ONE TABLET BY MOUTH ONCE DAILY, Disp: 30 tablet, Rfl: 6 .  insulin NPH-regular Human (NOVOLIN 70/30) (70-30) 100 UNIT/ML injection, Inject 16-32 Units into the skin 2 (two) times daily with a meal. 32 units in the morning, 16 units in the evening, Disp: , Rfl:  .  losartan (COZAAR) 25 MG tablet, Take 0.5 tablets (12.5 mg total) by mouth daily. (Patient taking differently: Take 25 mg by mouth daily. ), Disp: 30 tablet, Rfl: 11 .  potassium chloride SA (K-DUR,KLOR-CON) 20 MEQ tablet, Take 1 tablet (20 mEq total) by mouth daily. (Patient taking differently: Take 20 mEq by mouth daily. Pt  reports only taking every other day), Disp: 60 tablet, Rfl: 3 .  spironolactone (ALDACTONE) 25 MG tablet, Take 1 tablet (25 mg total) by mouth daily., Disp: 30 tablet, Rfl: 0 .  torsemide (DEMADEX) 20 MG tablet, Take 4 tabs in AM and 3 tabs in PM, Disp: 210 tablet, Rfl: 3  Past Medical History: Past Medical History:  Diagnosis Date  . AICD (automatic cardioverter/defibrillator) present   . Barretts syndrome   . Carotid artery disease (HCC)    a. Dopp 09/2011: 60-79% RICA, 40-59% LICA.;  b.  Carotid US (7/14):  Bilateral 60-79% => f/u 6 mos  . Cerebrovascular disease   . Chronic systolic heart failure (HCC)   . Coronary artery disease    a. AWMI requiring IABP 2005 s/p Horizon study stent-LAD, staged BMS-Cx, DESx2-RCA. b. Last Last LHC (6/06):  EF 25%, pLAD stent 40-50, after stent 40, dLAD 20, pCFX 20, pOM1 70, pRCA 20, mRCA stents ok.=> med Rx;  c.  Myoview (7/14):  EF 26%, large ant, septal, inf and apical infarct, no ischemia.  Med Rx continued  . Diabetes mellitus   . Heart murmur   . History of kidney stones   . HTN (hypertension)   . Hyperlipidemia    mixed  . ICD (implantable cardiac defibrillator), dual, in situ    St. Jude for severe LVD EF 25% 2/07 explanted 2010. Medtronic Virtuoso II DR Dual-chamber cardiverter-defibrillator  with pocket revision, Dr. Graciela Husbands  . Ischemic  cardiomyopathy    a. Echo (09/27/12): EF 20%, diffuse HK with periapical AK, no LV thrombus noted, restrictive physiology, trivial MR, mild LAE, RVSF mildly reduced, PASP 39  . Pleural effusion 01/05/2016  . Presence of permanent cardiac pacemaker   . PVD (peripheral vascular disease) (HCC)    a. ABI 09/2011 - R normal, L moderate - saw Dr. Kirke Corin - med rx.   Marland Kitchen RIATA ICD Lead--on advisory recall    . Tobacco abuse     Tobacco Use: History  Smoking Status  . Current Every Day Smoker  . Packs/day: 1.00  . Years: 45.00  . Types: Cigarettes  Smokeless Tobacco  . Never Used    Labs: Recent Review  Flowsheet Data    Labs for ITP Cardiac and Pulmonary Rehab Latest Ref Rng & Units 01/28/2016 01/28/2016 01/28/2016 01/28/2016 02/01/2016   Cholestrol 0 - 200 mg/dL - - - - 161   LDLCALC 0 - 99 mg/dL - - - - -   LDLDIRECT mg/dL - - - - -   HDL >09 mg/dL - - - - -   Trlycerides <150 mg/dL - - - - -   Hemoglobin A1c 4.8 - 5.6 % - - - - -   PHART 7.350 - 7.450 - - 7.460(H) - -   PCO2ART 32.0 - 48.0 mmHg - - 36.2 - -   HCO3 20.0 - 28.0 mmol/L 26.8 27.4 25.7 29.1(H) -   TCO2 0 - 100 mmol/L 28 29 27 30  -   O2SAT % 56.0 55.0 97.0 57.0 -      Capillary Blood Glucose: Lab Results  Component Value Date   GLUCAP 239 (H) 02/03/2016   GLUCAP 185 (H) 02/02/2016   GLUCAP 168 (H) 02/02/2016   GLUCAP 165 (H) 02/02/2016   GLUCAP 158 (H) 02/02/2016     Exercise Target Goals: Date: 03/07/16  Exercise Program Goal: Individual exercise prescription set with THRR, safety & activity barriers. Participant demonstrates ability to understand and report RPE using BORG scale, to self-measure pulse accurately, and to acknowledge the importance of the exercise prescription.  Exercise Prescription Goal: Starting with aerobic activity 30 plus minutes a day, 3 days per week for initial exercise prescription. Provide home exercise prescription and guidelines that participant acknowledges understanding prior to discharge.  Activity Barriers & Risk Stratification:     Activity Barriers & Cardiac Risk Stratification - 03/07/16 0908      Activity Barriers & Cardiac Risk Stratification   Activity Barriers Deconditioning;Muscular Weakness;Balance Concerns;History of Falls   Cardiac Risk Stratification High      6 Minute Walk:     6 Minute Walk    Row Name 03/07/16 1101         6 Minute Walk   Phase Initial     Distance 790 feet     Walk Time 3 minutes     # of Rest Breaks 1  1 minute rest break     MPH 2.99     METS 2.35     RPE 14     VO2 Peak 8.23     Symptoms Yes (comment)     Comments B  legs/hip pain8/10      Resting HR 91 bpm     Resting BP 120/71     Max Ex. HR 97 bpm     Max Ex. BP 130/60     2 Minute Post BP 106/74        Oxygen Initial Assessment:   Oxygen Re-Evaluation:  Oxygen Discharge (Final Oxygen Re-Evaluation):   Initial Exercise Prescription:     Initial Exercise Prescription - 03/07/16 1100      Date of Initial Exercise RX and Referring Provider   Date 03/07/16   Referring Provider Marca Ancona MD     Recumbant Bike   Level 1.5   Minutes 10   METs 1.4     NuStep   Level 2   SPM 85   Minutes 10   METs 1.8     Arm Ergometer   Level 1   Minutes 10   METs 1     Prescription Details   Frequency (times per week) 3   Duration Progress to 30 minutes of continuous aerobic without signs/symptoms of physical distress     Intensity   THRR 40-80% of Max Heartrate 64-127   Ratings of Perceived Exertion 11-13   Perceived Dyspnea 0-4     Progression   Progression Continue to progress workloads to maintain intensity without signs/symptoms of physical distress.     Resistance Training   Training Prescription Yes   Weight 2lbs   Reps 10-15      Perform Capillary Blood Glucose checks as needed.  Exercise Prescription Changes:   Exercise Comments:   Exercise Goals and Review:     Exercise Goals    Row Name 03/07/16 [phone]             Exercise Goals   Increase Physical Activity Yes       Intervention Provide advice, education, support and counseling about physical activity/exercise needs.;Develop an individualized exercise prescription for aerobic and resistive training based on initial evaluation findings, risk stratification, comorbidities and participant's personal goals.       Expected Outcomes Achievement of increased cardiorespiratory fitness and enhanced flexibility, muscular endurance and strength shown through measurements of functional capacity and personal statement of participant.       Increase Strength and  Stamina Yes       Intervention Develop an individualized exercise prescription for aerobic and resistive training based on initial evaluation findings, risk stratification, comorbidities and participant's personal goals.;Provide advice, education, support and counseling about physical activity/exercise needs.       Expected Outcomes Achievement of increased cardiorespiratory fitness and enhanced flexibility, muscular endurance and strength shown through measurements of functional capacity and personal statement of participant.          Exercise Goals Re-Evaluation :    Discharge Exercise Prescription (Final Exercise Prescription Changes):   Nutrition:  Target Goals: Understanding of nutrition guidelines, daily intake of sodium 1500mg , cholesterol 200mg , calories 30% from fat and 7% or less from saturated fats, daily to have 5 or more servings of fruits and vegetables.  Biometrics:     Pre Biometrics - 03/07/16 1111      Pre Biometrics   Waist Circumference 42.5 inches   Hip Circumference 43 inches   Waist to Hip Ratio 0.99 %   Triceps Skinfold 22 mm   % Body Fat 30.6 %   Grip Strength 30 kg   Flexibility 12.5 in   Single Leg Stand 1.66 seconds       Nutrition Therapy Plan and Nutrition Goals:   Nutrition Discharge: Nutrition Scores:   Nutrition Goals Re-Evaluation:   Nutrition Goals Re-Evaluation:   Nutrition Goals Discharge (Final Nutrition Goals Re-Evaluation):   Psychosocial: Target Goals: Acknowledge presence or absence of significant depression and/or stress, maximize coping skills, provide positive support system. Participant is able to verbalize types and ability to  use techniques and skills needed for reducing stress and depression.  Initial Review & Psychosocial Screening:     Initial Psych Review & Screening - 03/07/16 1602      Family Dynamics   Good Support System? Yes  parents, sister, daughter and friends      Barriers   Psychosocial  barriers to participate in program There are no identifiable barriers or psychosocial needs.     Screening Interventions   Interventions Encouraged to exercise;Provide feedback about the scores to participant      Quality of Life Scores:     Quality of Life - 03/07/16 1224      Quality of Life Scores   Health/Function Pre 21.04 %   Socioeconomic Pre 22.5 %   Psych/Spiritual Pre 23.79 %   Family Pre 23.63 %   GLOBAL Pre 22.28 %      PHQ-9: Recent Review Flowsheet Data    There is no flowsheet data to display.     Interpretation of Total Score  Total Score Depression Severity:  1-4 = Minimal depression, 5-9 = Mild depression, 10-14 = Moderate depression, 15-19 = Moderately severe depression, 20-27 = Severe depression   Psychosocial Evaluation and Intervention:   Psychosocial Re-Evaluation:   Psychosocial Discharge (Final Psychosocial Re-Evaluation):   Vocational Rehabilitation: Provide vocational rehab assistance to qualifying candidates.   Vocational Rehab Evaluation & Intervention:     Vocational Rehab - 03/07/16 1601      Initial Vocational Rehab Evaluation & Intervention   Assessment shows need for Vocational Rehabilitation Yes  pt currently works at [employer] which involves heavy lifting, standing and walking      Education: Education Goals: Education classes will be provided on a weekly basis, covering required topics. Participant will state understanding/return demonstration of topics presented.  Learning Barriers/Preferences:     Learning Barriers/Preferences - 03/07/16 [id number]      Learning Barriers/Preferences   Learning Barriers Sight   Learning Preferences Written Material;Video;Pictoral      Education Topics: Count Your Pulse:  -Group instruction provided by verbal instruction, demonstration, patient participation and written materials to support subject.  Instructors address importance of being able to find your pulse and how to  count your pulse when at home without a heart monitor.  Patients get hands on experience counting their pulse with staff help and individually.   Heart Attack, Angina, and Risk Factor Modification:  -Group instruction provided by verbal instruction, video, and written materials to support subject.  Instructors address signs and symptoms of angina and heart attacks.    Also discuss risk factors for heart disease and how to make changes to improve heart health risk factors.   Functional Fitness:  -Group instruction provided by verbal instruction, demonstration, patient participation, and written materials to support subject.  Instructors address safety measures for doing things around the house.  Discuss how to get up and down off the floor, how to pick things up properly, how to safely get out of a chair without assistance, and balance training.   Meditation and Mindfulness:  -Group instruction provided by verbal instruction, patient participation, and written materials to support subject.  Instructor addresses importance of mindfulness and meditation practice to help reduce stress and improve awareness.  Instructor also leads participants through a meditation exercise.    Stretching for Flexibility and Mobility:  -Group instruction provided by verbal instruction, patient participation, and written materials to support subject.  Instructors lead participants through series of stretches that are designed to increase flexibility  thus improving mobility.  These stretches are additional exercise for major muscle groups that are typically performed during regular warm up and cool down.   Hands Only CPR Anytime:  -Group instruction provided by verbal instruction, video, patient participation and written materials to support subject.  Instructors co-teach with AHA video for hands only CPR.  Participants get hands on experience with mannequins.   Nutrition I class: Heart Healthy Eating:  -Group  instruction provided by PowerPoint slides, verbal discussion, and written materials to support subject matter. The instructor gives an explanation and review of the Therapeutic Lifestyle Changes diet recommendations, which includes a discussion on lipid goals, dietary fat, sodium, fiber, plant stanol/sterol esters, sugar, and the components of a well-balanced, healthy diet.   Nutrition II class: Lifestyle Skills:  -Group instruction provided by PowerPoint slides, verbal discussion, and written materials to support subject matter. The instructor gives an explanation and review of label reading, grocery shopping for heart health, heart healthy recipe modifications, and ways to make healthier choices when eating out.   Diabetes Question & Answer:  -Group instruction provided by PowerPoint slides, verbal discussion, and written materials to support subject matter. The instructor gives an explanation and review of diabetes co-morbidities, pre- and post-prandial blood glucose goals, pre-exercise blood glucose goals, signs, symptoms, and treatment of hypoglycemia and hyperglycemia, and foot care basics.   Diabetes Blitz:  -Group instruction provided by PowerPoint slides, verbal discussion, and written materials to support subject matter. The instructor gives an explanation and review of the physiology behind type 1 and type 2 diabetes, diabetes medications and rational behind using different medications, pre- and post-prandial blood glucose recommendations and Hemoglobin A1c goals, diabetes diet, and exercise including blood glucose guidelines for exercising safely.    Portion Distortion:  -Group instruction provided by PowerPoint slides, verbal discussion, written materials, and food models to support subject matter. The instructor gives an explanation of serving size versus portion size, changes in portions sizes over the last 20 years, and what consists of a serving from each food group.   Stress  Management:  -Group instruction provided by verbal instruction, video, and written materials to support subject matter.  Instructors review role of stress in heart disease and how to cope with stress positively.     Exercising on Your Own:  -Group instruction provided by verbal instruction, power point, and written materials to support subject.  Instructors discuss benefits of exercise, components of exercise, frequency and intensity of exercise, and end points for exercise.  Also discuss use of nitroglycerin and activating EMS.  Review options of places to exercise outside of rehab.  Review guidelines for sex with heart disease.   Cardiac Drugs I:  -Group instruction provided by verbal instruction and written materials to support subject.  Instructor reviews cardiac drug classes: antiplatelets, anticoagulants, beta blockers, and statins.  Instructor discusses reasons, side effects, and lifestyle considerations for each drug class.   Cardiac Drugs II:  -Group instruction provided by verbal instruction and written materials to support subject.  Instructor reviews cardiac drug classes: angiotensin converting enzyme inhibitors (ACE-I), angiotensin II receptor blockers (ARBs), nitrates, and calcium channel blockers.  Instructor discusses reasons, side effects, and lifestyle considerations for each drug class.   Anatomy and Physiology of the Circulatory System:  -Group instruction provided by verbal instruction, video, and written materials to support subject.  Reviews functional anatomy of heart, how it relates to various diagnoses, and what role the heart plays in the overall system.   Knowledge Questionnaire Score:  Knowledge Questionnaire Score - 03/07/16 1057      Knowledge Questionnaire Score   Pre Score 25/28      Core Components/Risk Factors/Patient Goals at Admission:     Personal Goals and Risk Factors at Admission - 03/07/16 1112      Core Components/Risk Factors/Patient  Goals on Admission    Weight Management Obesity;Yes   Intervention Weight Management: Develop a combined nutrition and exercise program designed to reach desired caloric intake, while maintaining appropriate intake of nutrient and fiber, sodium and fats, and appropriate energy expenditure required for the weight goal.;Weight Management: Provide education and appropriate resources to help participant work on and attain dietary goals.;Obesity: Provide education and appropriate resources to help participant work on and attain dietary goals.;Weight Management/Obesity: Establish reasonable short term and long term weight goals.   Expected Outcomes Long Term: Adherence to nutrition and physical activity/exercise program aimed toward attainment of established weight goal;Short Term: Continue to assess and modify interventions until short term weight is achieved;Weight Maintenance: Understanding of the daily nutrition guidelines, which includes 25-35% calories from fat, 7% or less cal from saturated fats, less than 200mg  cholesterol, less than 1.5gm of sodium, & 5 or more servings of fruits and vegetables daily;Understanding recommendations for meals to include 15-35% energy as protein, 25-35% energy from fat, 35-60% energy from carbohydrates, less than 200mg  of dietary cholesterol, 20-35 gm of total fiber daily;Understanding of distribution of calorie intake throughout the day with the consumption of 4-5 meals/snacks   Tobacco Cessation --  currently smokes 10-12 cigarettes per day   Diabetes Yes   Intervention Provide education about signs/symptoms and action to take for hypo/hyperglycemia.;Provide education about proper nutrition, including hydration, and aerobic/resistive exercise prescription along with prescribed medications to achieve blood glucose in normal ranges: Fasting glucose 65-99 mg/dL   Expected Outcomes Short Term: Participant verbalizes understanding of the signs/symptoms and immediate care of  hyper/hypoglycemia, proper foot care and importance of medication, aerobic/resistive exercise and nutrition plan for blood glucose control.;Long Term: Attainment of HbA1C < 7%.   Hypertension Yes   Intervention Provide education on lifestyle modifcations including regular physical activity/exercise, weight management, moderate sodium restriction and increased consumption of fresh fruit, vegetables, and low fat dairy, alcohol moderation, and smoking cessation.;Monitor prescription use compliance.   Expected Outcomes Short Term: Continued assessment and intervention until BP is < 140/38mm HG in hypertensive participants. < 130/53mm HG in hypertensive participants with diabetes, heart failure or chronic kidney disease.;Long Term: Maintenance of blood pressure at goal levels.   Lipids Yes   Intervention Provide education and support for participant on nutrition & aerobic/resistive exercise along with prescribed medications to achieve LDL 70mg , HDL >40mg .   Expected Outcomes Short Term: Participant states understanding of desired cholesterol values and is compliant with medications prescribed. Participant is following exercise prescription and nutrition guidelines.;Long Term: Cholesterol controlled with medications as prescribed, with individualized exercise RX and with personalized nutrition plan. Value goals: LDL < 70mg , HDL > 40 mg.   Stress Yes   Intervention Offer individual and/or small group education and counseling on adjustment to heart disease, stress management and health-related lifestyle change. Teach and support self-help strategies.;Refer participants experiencing significant psychosocial distress to appropriate mental health specialists for further evaluation and treatment. When possible, include family members and significant others in education/counseling sessions.   Expected Outcomes Short Term: Participant demonstrates changes in health-related behavior, relaxation and other stress  management skills, ability to obtain effective social support, and compliance with psychotropic medications if prescribed.;Long Term: Emotional  wellbeing is indicated by absence of clinically significant psychosocial distress or social isolation.   Personal Goal Improve exercise tolerance and avoid open heart surgery and heart disease progression      Core Components/Risk Factors/Patient Goals Review:    Core Components/Risk Factors/Patient Goals at Discharge (Final Review):    ITP Comments:     ITP Comments    Row Name 03/07/16 0842           ITP Comments Medical Director:  Dr. Armanda Magic           Comments: Patient attended orientation from 0800 to [phone] to review rules and guidelines for program. Completed 6 minute walk test, Intitial ITP, and exercise prescription.  VSS. Telemetry-sinus rhythm, wide QRS with frequent PVC. Pt c/o significant claudication pain with ambulation.  Will continue to monitor.

## 2016-03-10 ENCOUNTER — Ambulatory Visit (HOSPITAL_COMMUNITY)
Admission: RE | Admit: 2016-03-10 | Discharge: 2016-03-10 | Disposition: A | Discharge status: Home / Self Care | Payer: BLUE CROSS/BLUE SHIELD | Source: Ambulatory Visit | Attending: Cardiology | Admitting: Cardiology

## 2016-03-10 ENCOUNTER — Encounter (HOSPITAL_COMMUNITY): Payer: Self-pay

## 2016-03-10 VITALS — BP 96/62 | HR 82 | Wt 220.8 lb

## 2016-03-10 DIAGNOSIS — I5022 Chronic systolic (congestive) heart failure: Secondary | ICD-10-CM | POA: Diagnosis not present

## 2016-03-10 DIAGNOSIS — E1151 Type 2 diabetes mellitus with diabetic peripheral angiopathy without gangrene: Secondary | ICD-10-CM | POA: Diagnosis not present

## 2016-03-10 DIAGNOSIS — Z955 Presence of coronary angioplasty implant and graft: Secondary | ICD-10-CM | POA: Diagnosis not present

## 2016-03-10 DIAGNOSIS — Z7902 Long term (current) use of antithrombotics/antiplatelets: Secondary | ICD-10-CM | POA: Diagnosis not present

## 2016-03-10 DIAGNOSIS — I255 Ischemic cardiomyopathy: Secondary | ICD-10-CM | POA: Diagnosis not present

## 2016-03-10 DIAGNOSIS — Z7982 Long term (current) use of aspirin: Secondary | ICD-10-CM | POA: Diagnosis not present

## 2016-03-10 DIAGNOSIS — I6529 Occlusion and stenosis of unspecified carotid artery: Secondary | ICD-10-CM | POA: Insufficient documentation

## 2016-03-10 DIAGNOSIS — E785 Hyperlipidemia, unspecified: Secondary | ICD-10-CM | POA: Diagnosis not present

## 2016-03-10 DIAGNOSIS — I252 Old myocardial infarction: Secondary | ICD-10-CM | POA: Insufficient documentation

## 2016-03-10 DIAGNOSIS — Z794 Long term (current) use of insulin: Secondary | ICD-10-CM | POA: Insufficient documentation

## 2016-03-10 DIAGNOSIS — I739 Peripheral vascular disease, unspecified: Secondary | ICD-10-CM | POA: Diagnosis not present

## 2016-03-10 DIAGNOSIS — I2510 Atherosclerotic heart disease of native coronary artery without angina pectoris: Secondary | ICD-10-CM | POA: Diagnosis not present

## 2016-03-10 DIAGNOSIS — I110 Hypertensive heart disease with heart failure: Secondary | ICD-10-CM | POA: Insufficient documentation

## 2016-03-10 LAB — BASIC METABOLIC PANEL
ANION GAP: 9 (ref 5–15)
BUN: 20 mg/dL (ref 6–20)
CALCIUM: 9.2 mg/dL (ref 8.9–10.3)
CO2: 30 mmol/L (ref 22–32)
CREATININE: 0.95 mg/dL (ref 0.61–1.24)
Chloride: 96 mmol/L — ABNORMAL LOW (ref 101–111)
GFR calc Af Amer: >60 mL/min (ref >60)
GFR calc non Af Amer: >60 mL/min (ref >60)
GLUCOSE: 166 mg/dL — AB (ref 65–99)
Potassium: 3.8 mmol/L (ref 3.5–5.1)
Sodium: 135 mmol/L (ref 135–145)

## 2016-03-10 LAB — LIPID PANEL
CHOL/HDL RATIO: 2.1 ratio
CHOLESTEROL: 103 mg/dL (ref 0–200)
HDL: 49 mg/dL (ref >40)
LDL Cholesterol: 45 mg/dL (ref 0–99)
TRIGLYCERIDES: 43 mg/dL (ref <150)
VLDL: 9 mg/dL (ref 0–40)

## 2016-03-10 LAB — DIGOXIN LEVEL: DIGOXIN LVL: 0.6 ng/mL — AB (ref 0.8–2.0)

## 2016-03-10 LAB — BRAIN NATRIURETIC PEPTIDE: B NATRIURETIC PEPTIDE 5: 487.7 pg/mL — AB (ref 0.0–100.0)

## 2016-03-10 MED ORDER — POTASSIUM CHLORIDE CRYS ER 20 MEQ PO TBCR: 20.0000 meq | EXTENDED_RELEASE_TABLET | ORAL | 6 refills | Status: DC

## 2016-03-10 MED ORDER — METOLAZONE 2.5 MG PO TABS: ORAL_TABLET | ORAL | 3 refills | Status: DC

## 2016-03-10 NOTE — Patient Instructions (Addendum)
Take metolzaone 2.5 mg (1 tablet) once every Saturday morning with your morning dose of torsemide.  Continue taking potassium 20 meq (1 tablet) every other day. Also take extra 20 meq (1 tablet) every Saturday with metolazone in addition to your normal dose (if you are due to take potassium on Saturday, take 2 tablets).  Routine lab work today. Will notify you of abnormal results, otherwise no news is good news!  Return on 03/20/2016 for labs.  May return to work 03/27/2016 with rest breaks as needed.  Follow up with Dr. Shirlee LatchMcLean 1 month.  Do the following things EVERYDAY: 1) Weigh yourself in the morning before breakfast. Write it down and keep it in a log. 2) Take your medicines as prescribed 3) Eat low salt foods-Limit salt (sodium) to 2000 mg per day.  4) Stay as active as you can everyday 5) Limit all fluids for the day to less than 2 liters

## 2016-03-12 NOTE — Progress Notes (Signed)
Advanced Heart Failure Clinic Note   PCP: Dr. Sigmund Hazel Cardiology: Dr. Jens Som HF Cardiology: Dr. Shirlee Latch  61 yo smoker with history of PAD, CAD, and ischemic cardiomyopathy presents for CHF clinic evaluation after recent admission.  Patient was admitted in 1/18 with PNA and acute on chronic systolic CHF.  He was markedly volume overloaded and also had low cardiac output requiring dobutamine use.  He had a right pleural effusion and underwent thoracentesis.  Fluid studies suggested a parapneumonic effusion.  He had cardiac cath, showing 3 vessel disease and markedly elevated filling pressures, right and left.  He was evaluated for CABG but turned down given comorbidities and lack of venous conduits.  We were able to wean him off dobutamine and discharged him with plan for outpatient PCI once he had recovered from the current hospitalization.   Admitted 01/28/16 - 02/03/16 after presenting for elective 2 vessel PCI of the LAD and OM of the circumflex. RHC at same time showed advanced HF with low cardiac index and severely elevated pressures. Diuresed with IV lasix, metolazone, and milrinone, but refused PICC line to follow coox/CVP.  Underwent thoracentesis 02/01/16 with 1.8 L out. Overall diuresed 16 lbs. Discharge weight 214 lbs.  He presents today for followup. He is short of breath walking up steps or pulling his trash cans to the street.  He is short of breath walking about 75 yards on flat ground.  No chest pain. No orthopnea/PND.  His calves tighten when he walks up an incline.  He has cut back on cigarettes but not quit. He is ready to go back to work.   Corevue (reviewed personally): Recent volume overload but impedance trending up.     Labs (1/18): K 4.2, creatinine 0.9, hgb 12.6 Labs (2/18): K 4.2, creatinine 1.23, hgb 12.4, BNP 321  ECG (1/18): NSR, old anterior infarct, narrow QRS  PMH: 1. PAD: Left fem-pop bypass with graft in 2017.  2. CAD: Anterior MI 2005 => PCI to LAD followed  by staged PCI to LCx and RCA.   - LHC (1/18): Diffuse up to 80% calcified stenosis throughout the proximal LAD.  Diffuse disease in distal LAD up to 50%.  Large OM1 totally occluded at the ostium but reconstituted via collaterals.  80% distal RCA stenosis involving the ostia of the PLV and PDA with about 70% stenosis. Patient had PCI with DES to proximal LAD and PTCA OM1 (unable to place stent).  3. Chronic systolic CHF: Ischemic cardiomyopathy.  St Jude ICD.  - Echo (1/18) with EF 20%, mildly decreased RV systolic function.  - RHC (1/18) with mean RA 21, PA 69/31 mean 45, mean PCWP 27, CI 2.37, PVR 3.3 WU.  - Repeat RHC (1/18) with mean RA 16, mean PCWP 21, CI 1.92 4. Carotid stenosis: Carotid dopplers (8/16) with 60-79% RICA, 40-59% LICA.   - Carotid dopplers (2/18) with 40-59% RICA stenosis.  5. Type II diabetes. 6. Hyperlipidemia. 7. HTN 8. Active smoker.  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  Smoking around 1 ppd.  Occasional ETOH.   Family History  Problem Relation Age of Onset  . Diabetes Mother   . Coronary artery disease Father   . Heart disease Father     before age 75   ROS: All systems reviewed and negative except as per HPI.   Current Outpatient Prescriptions  Medication Sig Dispense Refill  . aspirin 81 MG tablet Take 1 tablet (81 mg total) by mouth daily. 30  tablet 3  . atorvastatin (LIPITOR) 80 MG tablet Take 1 tablet (80 mg total) by mouth every other day. 30 tablet 11  . carvedilol (COREG) 6.25 MG tablet Take 3.125 mg by mouth 2 (two) times daily with a meal.    . clopidogrel (PLAVIX) 75 MG tablet Take 1 tablet (75 mg total) by mouth daily. 30 tablet 6  . digoxin (LANOXIN) 0.125 MG tablet Take 1 tablet (0.125 mg total) by mouth daily. 30 tablet 11  . ezetimibe (ZETIA) 10 MG tablet TAKE ONE TABLET BY MOUTH ONCE DAILY 30 tablet 6  . insulin NPH-regular Human (NOVOLIN 70/30) (70-30) 100 UNIT/ML injection Inject 16-32 Units into the skin 2 (two) times  daily with a meal. 32 units in the morning, 16 units in the evening    . losartan (COZAAR) 25 MG tablet Take 25 mg by mouth daily.    . potassium chloride SA (K-DUR,KLOR-CON) 20 MEQ tablet Take 1 tablet (20 mEq total) by mouth every other day. Take extra 2 meq (1 tablet) on Saturdays with metolazone. 20 tablet 6  . spironolactone (ALDACTONE) 25 MG tablet Take 1 tablet (25 mg total) by mouth daily. 30 tablet 0  . torsemide (DEMADEX) 20 MG tablet Take 80 mg by mouth 2 (two) times daily.    . metolazone (ZAROXOLYN) 2.5 MG tablet Take 2.5 mg (1 tablet) once weekly on Saturday mornings. 90 tablet 3   No current facility-administered medications for this encounter.    BP 96/62 (BP Location: Right Arm, Patient Position: Sitting, Cuff Size: Normal)   Pulse 82   Wt 220 lb 12.8 oz (100.2 kg)   SpO2 100%   BMI 29.95 kg/m    Wt Readings from Last 3 Encounters:  03/10/16 220 lb 12.8 oz (100.2 kg)  03/07/16 224 lb 6.9 oz (101.8 kg)  02/08/16 208 lb 12.8 oz (94.7 kg)    General: NAD Neck: JVP 8-9 cm with HJR, no thyromegaly or thyroid nodule.  Lungs: Decreased breath sounds right base.   CV: Nondisplaced PMI.  Heart regular S1/S2, no S3/S4, 2/6 early SEM RUSB.  No carotid bruit.  Unable to palpate pedal pulses. Abdomen: Soft, NT, ND, no HSM. No bruits or masses. +BS  Skin: Chronic lower leg discoloration suggestive of venous stasis dermatitis. Trace edema. Neurologic: Alert and oriented x 3.  Psych: Normal affect. Extremities: No clubbing or cyanosis.  HEENT: Normal.   Assessment/Plan: 1. Chronic systolic CHF: Ischemic cardiomyopathy.  Echo (1/18) with EF 20%, mildly decreased RV systolic function.  Significant component of RV failure.  St Jude ICD.  On exam today, he is volume overloaded.  Corevue also suggests recent volume overload.    - Continue torsemide 80 mg bid and will add metolazone 2.5 mg once a week on Saturdays.  He will take an extra 20 mEq KCl on metolazone days.  BMET today and  again in 2 wks.    - Continue digoxin 0.125 mg daily, check level today.  - Continue losartan 25 mg daily  - Continue spironolactone 25 mg daily. - Tolerating coreg 3.125 mg BID.   - Concern for his overall trajectory with multiple admissions requiring inotrope support. May be approaching need for advanced support.  2. CAD: 3 vessel CAD, s/p Stent to proximal LAD and PTCA to subtotally occluded large OM1 in 1/18.  - Continue plavix 75 mg daily. Continue ASA 81 and atorvastatin 80 mg daily.   - Check lipids today.   3. PAD: Stable claudication, notes walking up a  hill.  - He follows with [facility]. No change to current plans.  4. Carotid stenosis:  Repeat carotid dopplers in 2/19.  5. R Pleural effusion: Decreased breath sounds on right.  Will get repeat CXR (PA/lateral) with right lateral decubitus film to follow pleural effusion.   Followup in 1 month.   Marca Ancona 03/12/2016

## 2016-03-13 ENCOUNTER — Encounter (HOSPITAL_COMMUNITY): Payer: BLUE CROSS/BLUE SHIELD

## 2016-03-13 ENCOUNTER — Telehealth (HOSPITAL_COMMUNITY): Payer: Self-pay

## 2016-03-13 ENCOUNTER — Encounter (HOSPITAL_COMMUNITY): Payer: Self-pay

## 2016-03-13 ENCOUNTER — Encounter (HOSPITAL_COMMUNITY): Admission: RE | Admit: 2016-03-13 | Payer: BLUE CROSS/BLUE SHIELD | Source: Ambulatory Visit

## 2016-03-13 ENCOUNTER — Encounter (HOSPITAL_COMMUNITY)
Admission: RE | Admit: 2016-03-13 | Discharge: 2016-03-13 | Disposition: A | Discharge status: Home / Self Care | Payer: BLUE CROSS/BLUE SHIELD | Source: Ambulatory Visit | Attending: Cardiovascular Disease | Admitting: Cardiovascular Disease

## 2016-03-13 DIAGNOSIS — I2510 Atherosclerotic heart disease of native coronary artery without angina pectoris: Secondary | ICD-10-CM | POA: Diagnosis not present

## 2016-03-13 DIAGNOSIS — Z955 Presence of coronary angioplasty implant and graft: Secondary | ICD-10-CM

## 2016-03-13 LAB — GLUCOSE, CAPILLARY
GLUCOSE-CAPILLARY: 196 mg/dL — AB (ref 65–99)
Glucose-Capillary: 117 mg/dL — ABNORMAL HIGH (ref 65–99)

## 2016-03-13 NOTE — Telephone Encounter (Addendum)
Received call from cardiac rehab stating patient started today and did well, but post exercise became symptomatically orthostatic with sitting BP 103/64 and standing BP 76/51. Patient took metolazone as ordered on Saturday and reports not eating or drinking much, and hasnt had anything to eat or drink this morning. Advised to hold torsemide today and drink extra glass of water today. Also advised to call our office tomorrow morning if s/s continue.  [name], [name], RN

## 2016-03-13 NOTE — Progress Notes (Signed)
Cardiac Individual Treatment Plan  Patient Details  Name: [name] MRN: [id number] Date of Birth: Mar 07, 1955 Referring Provider:   Flowsheet Row CARDIAC REHAB PHASE II ORIENTATION from 03/07/2016 in [facility] CARDIAC REHAB  Referring Provider  Marca Ancona MD      Initial Encounter Date:  Flowsheet Row CARDIAC REHAB PHASE II ORIENTATION from 03/07/2016 in [facility] CARDIAC REHAB  Date  03/07/16  Referring Provider  Marca Ancona MD      Visit Diagnosis: Stented coronary artery  Patient's Home Medications on Admission:  Current Outpatient Prescriptions:  .  aspirin 81 MG tablet, Take 1 tablet (81 mg total) by mouth daily., Disp: 30 tablet, Rfl: 3 .  atorvastatin (LIPITOR) 80 MG tablet, Take 1 tablet (80 mg total) by mouth every other day., Disp: 30 tablet, Rfl: 11 .  carvedilol (COREG) 6.25 MG tablet, Take 3.125 mg by mouth 2 (two) times daily with a meal., Disp: , Rfl:  .  clopidogrel (PLAVIX) 75 MG tablet, Take 1 tablet (75 mg total) by mouth daily., Disp: 30 tablet, Rfl: 6 .  digoxin (LANOXIN) 0.125 MG tablet, Take 1 tablet (0.125 mg total) by mouth daily., Disp: 30 tablet, Rfl: 11 .  ezetimibe (ZETIA) 10 MG tablet, TAKE ONE TABLET BY MOUTH ONCE DAILY, Disp: 30 tablet, Rfl: 6 .  insulin NPH-regular Human (NOVOLIN 70/30) (70-30) 100 UNIT/ML injection, Inject 16-32 Units into the skin 2 (two) times daily with a meal. 32 units in the morning, 16 units in the evening, Disp: , Rfl:  .  losartan (COZAAR) 25 MG tablet, Take 25 mg by mouth daily., Disp: , Rfl:  .  metolazone (ZAROXOLYN) 2.5 MG tablet, Take 2.5 mg (1 tablet) once weekly on Saturday mornings., Disp: 90 tablet, Rfl: 3 .  potassium chloride SA (K-DUR,KLOR-CON) 20 MEQ tablet, Take 1 tablet (20 mEq total) by mouth every other day. Take extra 2 meq (1 tablet) on Saturdays with metolazone., Disp: 20 tablet, Rfl: 6 .  spironolactone (ALDACTONE) 25 MG tablet, Take 1 tablet (25 mg total) by  mouth daily., Disp: 30 tablet, Rfl: 0 .  torsemide (DEMADEX) 20 MG tablet, Take 80 mg by mouth 2 (two) times daily., Disp: , Rfl:   Past Medical History: Past Medical History:  Diagnosis Date  . AICD (automatic cardioverter/defibrillator) present   . Barretts syndrome   . Carotid artery disease (HCC)    a. Dopp 09/2011: 60-79% RICA, 40-59% LICA.;  b.  Carotid US (7/14):  Bilateral 60-79% => f/u 6 mos  . Cerebrovascular disease   . Chronic systolic heart failure (HCC)   . Coronary artery disease    a. AWMI requiring IABP 2005 s/p Horizon study stent-LAD, staged BMS-Cx, DESx2-RCA. b. Last Last LHC (6/06):  EF 25%, pLAD stent 40-50, after stent 40, dLAD 20, pCFX 20, pOM1 70, pRCA 20, mRCA stents ok.=> med Rx;  c.  Myoview (7/14):  EF 26%, large ant, septal, inf and apical infarct, no ischemia.  Med Rx continued  . Diabetes mellitus   . Heart murmur   . History of kidney stones   . HTN (hypertension)   . Hyperlipidemia    mixed  . ICD (implantable cardiac defibrillator), dual, in situ    St. Jude for severe LVD EF 25% 2/07 explanted 2010. Medtronic Virtuoso II DR Dual-chamber cardiverter-defibrillator  with pocket revision, Dr. Graciela Husbands  . Ischemic cardiomyopathy    a. Echo (09/27/12): EF 20%, diffuse HK with periapical AK, no LV thrombus noted, restrictive  physiology, trivial MR, mild LAE, RVSF mildly reduced, PASP 39  . Pleural effusion 01/05/2016  . Presence of permanent cardiac pacemaker   . PVD (peripheral vascular disease) (HCC)    a. ABI 09/2011 - R normal, L moderate - saw Dr. Kirke Corin - med rx.   Marland Kitchen RIATA ICD Lead--on advisory recall    . Tobacco abuse     Tobacco Use: History  Smoking Status  . Current Every Day Smoker  . Packs/day: 1.00  . Years: 45.00  . Types: Cigarettes  Smokeless Tobacco  . Never Used    Comment: pt reports he is stress smoker    Labs: Recent Review Flowsheet Data    Labs for ITP Cardiac and Pulmonary Rehab Latest Ref Rng & Units 01/28/2016 01/28/2016  01/28/2016 02/01/2016 03/10/2016   Cholestrol 0 - 200 mg/dL - - - 914 782   LDLCALC 0 - 99 mg/dL - - - - 45   LDLDIRECT mg/dL - - - - -   HDL >95 mg/dL - - - - 49   Trlycerides <150 mg/dL - - - - 43   Hemoglobin A1c 4.8 - 5.6 % - - - - -   PHART 7.350 - 7.450 - 7.460(H) - - -   PCO2ART 32.0 - 48.0 mmHg - 36.2 - - -   HCO3 20.0 - 28.0 mmol/L 27.4 25.7 29.1(H) - -   TCO2 0 - 100 mmol/L 29 27 30  - -   O2SAT % 55.0 97.0 57.0 - -      Capillary Blood Glucose: Lab Results  Component Value Date   GLUCAP 117 (H) 03/13/2016   GLUCAP 196 (H) 03/13/2016   GLUCAP 239 (H) 02/03/2016   GLUCAP 185 (H) 02/02/2016   GLUCAP 168 (H) 02/02/2016     Exercise Target Goals:    Exercise Program Goal: Individual exercise prescription set with THRR, safety & activity barriers. Participant demonstrates ability to understand and report RPE using BORG scale, to self-measure pulse accurately, and to acknowledge the importance of the exercise prescription.  Exercise Prescription Goal: Starting with aerobic activity 30 plus minutes a day, 3 days per week for initial exercise prescription. Provide home exercise prescription and guidelines that participant acknowledges understanding prior to discharge.  Activity Barriers & Risk Stratification:     Activity Barriers & Cardiac Risk Stratification - 03/07/16 0908      Activity Barriers & Cardiac Risk Stratification   Activity Barriers Deconditioning;Muscular Weakness;Balance Concerns;History of Falls   Cardiac Risk Stratification High      6 Minute Walk:     6 Minute Walk    Row Name 03/07/16 1101         6 Minute Walk   Phase Initial     Distance 790 feet     Walk Time 3 minutes     # of Rest Breaks 1  1 minute rest break     MPH 2.99     METS 2.35     RPE 14     VO2 Peak 8.23     Symptoms Yes (comment)     Comments B legs/hip pain8/10      Resting HR 91 bpm     Resting BP 120/71     Max Ex. HR 97 bpm     Max Ex. BP 130/60     2 Minute  Post BP 106/74        Oxygen Initial Assessment:   Oxygen Re-Evaluation:   Oxygen Discharge (Final Oxygen Re-Evaluation):  Initial Exercise Prescription:     Initial Exercise Prescription - 03/07/16 1100      Date of Initial Exercise RX and Referring Provider   Date 03/07/16   Referring Provider Marca Ancona MD     Recumbant Bike   Level 1.5   Minutes 10   METs 1.4     NuStep   Level 2   SPM 85   Minutes 10   METs 1.8     Arm Ergometer   Level 1   Minutes 10   METs 1     Prescription Details   Frequency (times per week) 3   Duration Progress to 30 minutes of continuous aerobic without signs/symptoms of physical distress     Intensity   THRR 40-80% of Max Heartrate 64-127   Ratings of Perceived Exertion 11-13   Perceived Dyspnea 0-4     Progression   Progression Continue to progress workloads to maintain intensity without signs/symptoms of physical distress.     Resistance Training   Training Prescription Yes   Weight 2lbs   Reps 10-15      Perform Capillary Blood Glucose checks as needed.  Exercise Prescription Changes:   Exercise Comments:   Exercise Goals and Review:     Exercise Goals    Row Name 03/07/16 [id number]             Exercise Goals   Increase Physical Activity Yes       Intervention Provide advice, education, support and counseling about physical activity/exercise needs.;Develop an individualized exercise prescription for aerobic and resistive training based on initial evaluation findings, risk stratification, comorbidities and participant's personal goals.       Expected Outcomes Achievement of increased cardiorespiratory fitness and enhanced flexibility, muscular endurance and strength shown through measurements of functional capacity and personal statement of participant.       Increase Strength and Stamina Yes       Intervention Develop an individualized exercise prescription for aerobic and resistive training based on  initial evaluation findings, risk stratification, comorbidities and participant's personal goals.;Provide advice, education, support and counseling about physical activity/exercise needs.       Expected Outcomes Achievement of increased cardiorespiratory fitness and enhanced flexibility, muscular endurance and strength shown through measurements of functional capacity and personal statement of participant.          Exercise Goals Re-Evaluation :    Discharge Exercise Prescription (Final Exercise Prescription Changes):   Nutrition:  Target Goals: Understanding of nutrition guidelines, daily intake of sodium 1500mg , cholesterol 200mg , calories 30% from fat and 7% or less from saturated fats, daily to have 5 or more servings of fruits and vegetables.  Biometrics:     Pre Biometrics - 03/07/16 1111      Pre Biometrics   Waist Circumference 42.5 inches   Hip Circumference 43 inches   Waist to Hip Ratio 0.99 %   Triceps Skinfold 22 mm   % Body Fat 30.6 %   Grip Strength 30 kg   Flexibility 12.5 in   Single Leg Stand 1.66 seconds       Nutrition Therapy Plan and Nutrition Goals:   Nutrition Discharge: Nutrition Scores:   Nutrition Goals Re-Evaluation:   Nutrition Goals Re-Evaluation:   Nutrition Goals Discharge (Final Nutrition Goals Re-Evaluation):   Psychosocial: Target Goals: Acknowledge presence or absence of significant depression and/or stress, maximize coping skills, provide positive support system. Participant is able to verbalize types and ability to use techniques and skills needed for reducing  stress and depression.  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  He currently lives in her home but is hopeful he will be moving soon.       Barriers   Psychosocial barriers to participate in program The patient  should benefit from training in stress management and relaxation.     Screening Interventions   Interventions Encouraged to exercise;To provide support and resources with identified psychosocial needs;Provide feedback about the scores to participant      Quality of Life Scores:     Quality of Life - 03/07/16 1224      Quality of Life Scores   Health/Function Pre 21.04 %   Socioeconomic Pre 22.5 %   Psych/Spiritual Pre 23.79 %   Family Pre 23.63 %   GLOBAL Pre 22.28 %      PHQ-9: Recent Review Flowsheet Data    Depression screen [facility] 2/9 03/13/2016   Decreased Interest 0   Down, Depressed, Hopeless 0   PHQ - 2 Score 0     Interpretation of Total Score  Total Score Depression Severity:  1-4 = Minimal depression, 5-9 = Mild depression, 10-14 = Moderate depression, 15-19 = Moderately severe depression, 20-27 = Severe depression   Psychosocial Evaluation and Intervention:     Psychosocial Evaluation - 03/13/16 1114      Psychosocial Evaluation & Interventions   Interventions Relaxation education;Encouraged to exercise with the program and follow exercise prescription;Stress management education   Expected Outcomes pt will demonstrate improved coping skills and positive outlook.     Continue Psychosocial Services  Follow up required by staff      Psychosocial Re-Evaluation:   Psychosocial Discharge (Final Psychosocial Re-Evaluation):   Vocational Rehabilitation: Provide vocational rehab assistance to qualifying candidates.   Vocational Rehab Evaluation & Intervention:     Vocational Rehab - 03/13/16 1109      Initial Vocational Rehab Evaluation & Intervention   Assessment shows need for Vocational Rehabilitation Yes  pt denies needs, plans to return to previous employer however is unsure of his physical ability to perform job duties.  pt given voc rehab packet.     Vocational Rehab Packet given to patient 03/13/16      Education: Education Goals: Education  classes will be provided on a weekly basis, covering required topics. Participant will state understanding/return demonstration of topics presented.  Learning Barriers/Preferences:     Learning Barriers/Preferences - 03/07/16 1610      Learning Barriers/Preferences   Learning Barriers Sight   Learning Preferences Written Material;Video;Pictoral      Education Topics: Count Your Pulse:  -Group instruction provided by verbal instruction, demonstration, patient participation and written materials to support subject.  Instructors address importance of being able to find your pulse and how to count your pulse when at home without a heart monitor.  Patients get hands on experience counting their pulse with staff help and individually.   Heart Attack, Angina, and Risk Factor Modification:  -Group instruction provided by verbal instruction, video, and written materials to support subject.  Instructors address signs and symptoms of angina and heart attacks.    Also discuss risk factors for heart disease and how to make changes to improve heart health risk factors.   Functional Fitness:  -Group instruction provided by verbal instruction, demonstration, patient participation, and written materials  to support subject.  Instructors address safety measures for doing things around the house.  Discuss how to get up and down off the floor, how to pick things up properly, how to safely get out of a chair without assistance, and balance training.   Meditation and Mindfulness:  -Group instruction provided by verbal instruction, patient participation, and written materials to support subject.  Instructor addresses importance of mindfulness and meditation practice to help reduce stress and improve awareness.  Instructor also leads participants through a meditation exercise.    Stretching for Flexibility and Mobility:  -Group instruction provided by verbal instruction, patient participation, and written  materials to support subject.  Instructors lead participants through series of stretches that are designed to increase flexibility thus improving mobility.  These stretches are additional exercise for major muscle groups that are typically performed during regular warm up and cool down.   Hands Only CPR Anytime:  -Group instruction provided by verbal instruction, video, patient participation and written materials to support subject.  Instructors co-teach with AHA video for hands only CPR.  Participants get hands on experience with mannequins.   Nutrition I class: Heart Healthy Eating:  -Group instruction provided by PowerPoint slides, verbal discussion, and written materials to support subject matter. The instructor gives an explanation and review of the Therapeutic Lifestyle Changes diet recommendations, which includes a discussion on lipid goals, dietary fat, sodium, fiber, plant stanol/sterol esters, sugar, and the components of a well-balanced, healthy diet.   Nutrition II class: Lifestyle Skills:  -Group instruction provided by PowerPoint slides, verbal discussion, and written materials to support subject matter. The instructor gives an explanation and review of label reading, grocery shopping for heart health, heart healthy recipe modifications, and ways to make healthier choices when eating out.   Diabetes Question & Answer:  -Group instruction provided by PowerPoint slides, verbal discussion, and written materials to support subject matter. The instructor gives an explanation and review of diabetes co-morbidities, pre- and post-prandial blood glucose goals, pre-exercise blood glucose goals, signs, symptoms, and treatment of hypoglycemia and hyperglycemia, and foot care basics.   Diabetes Blitz:  -Group instruction provided by PowerPoint slides, verbal discussion, and written materials to support subject matter. The instructor gives an explanation and review of the physiology behind type 1  and type 2 diabetes, diabetes medications and rational behind using different medications, pre- and post-prandial blood glucose recommendations and Hemoglobin A1c goals, diabetes diet, and exercise including blood glucose guidelines for exercising safely.    Portion Distortion:  -Group instruction provided by PowerPoint slides, verbal discussion, written materials, and food models to support subject matter. The instructor gives an explanation of serving size versus portion size, changes in portions sizes over the last 20 years, and what consists of a serving from each food group.   Stress Management:  -Group instruction provided by verbal instruction, video, and written materials to support subject matter.  Instructors review role of stress in heart disease and how to cope with stress positively.     Exercising on Your Own:  -Group instruction provided by verbal instruction, power point, and written materials to support subject.  Instructors discuss benefits of exercise, components of exercise, frequency and intensity of exercise, and end points for exercise.  Also discuss use of nitroglycerin and activating EMS.  Review options of places to exercise outside of rehab.  Review guidelines for sex with heart disease.   Cardiac Drugs I:  -Group instruction provided by verbal instruction and written materials to support subject.  Instructor reviews cardiac drug classes: antiplatelets, anticoagulants, beta blockers, and statins.  Instructor discusses reasons, side effects, and lifestyle considerations for each drug class.   Cardiac Drugs II:  -Group instruction provided by verbal instruction and written materials to support subject.  Instructor reviews cardiac drug classes: angiotensin converting enzyme inhibitors (ACE-I), angiotensin II receptor blockers (ARBs), nitrates, and calcium channel blockers.  Instructor discusses reasons, side effects, and lifestyle considerations for each drug  class.   Anatomy and Physiology of the Circulatory System:  -Group instruction provided by verbal instruction, video, and written materials to support subject.  Reviews functional anatomy of heart, how it relates to various diagnoses, and what role the heart plays in the overall system.   Knowledge Questionnaire Score:     Knowledge Questionnaire Score - 03/07/16 1057      Knowledge Questionnaire Score   Pre Score 25/28      Core Components/Risk Factors/Patient Goals at Admission:     Personal Goals and Risk Factors at Admission - 03/07/16 1112      Core Components/Risk Factors/Patient Goals on Admission    Weight Management Obesity;Yes   Intervention Weight Management: Develop a combined nutrition and exercise program designed to reach desired caloric intake, while maintaining appropriate intake of nutrient and fiber, sodium and fats, and appropriate energy expenditure required for the weight goal.;Weight Management: Provide education and appropriate resources to help participant work on and attain dietary goals.;Obesity: Provide education and appropriate resources to help participant work on and attain dietary goals.;Weight Management/Obesity: Establish reasonable short term and long term weight goals.   Expected Outcomes Long Term: Adherence to nutrition and physical activity/exercise program aimed toward attainment of established weight goal;Short Term: Continue to assess and modify interventions until short term weight is achieved;Weight Maintenance: Understanding of the daily nutrition guidelines, which includes 25-35% calories from fat, 7% or less cal from saturated fats, less than 200mg  cholesterol, less than 1.5gm of sodium, & 5 or more servings of fruits and vegetables daily;Understanding recommendations for meals to include 15-35% energy as protein, 25-35% energy from fat, 35-60% energy from carbohydrates, less than 200mg  of dietary cholesterol, 20-35 gm of total fiber  daily;Understanding of distribution of calorie intake throughout the day with the consumption of 4-5 meals/snacks   Tobacco Cessation --  currently smokes 10-12 cigarettes per day   Diabetes Yes   Intervention Provide education about signs/symptoms and action to take for hypo/hyperglycemia.;Provide education about proper nutrition, including hydration, and aerobic/resistive exercise prescription along with prescribed medications to achieve blood glucose in normal ranges: Fasting glucose 65-99 mg/dL   Expected Outcomes Short Term: Participant verbalizes understanding of the signs/symptoms and immediate care of hyper/hypoglycemia, proper foot care and importance of medication, aerobic/resistive exercise and nutrition plan for blood glucose control.;Long Term: Attainment of HbA1C < 7%.   Hypertension Yes   Intervention Provide education on lifestyle modifcations including regular physical activity/exercise, weight management, moderate sodium restriction and increased consumption of fresh fruit, vegetables, and low fat dairy, alcohol moderation, and smoking cessation.;Monitor prescription use compliance.   Expected Outcomes Short Term: Continued assessment and intervention until BP is < 140/1390mm HG in hypertensive participants. < 130/6680mm HG in hypertensive participants with diabetes, heart failure or chronic kidney disease.;Long Term: Maintenance of blood pressure at goal levels.   Lipids Yes   Intervention Provide education and support for participant on nutrition & aerobic/resistive exercise along with prescribed medications to achieve LDL 70mg , HDL >40mg .   Expected Outcomes Short Term: Participant states understanding of desired cholesterol values  and is compliant with medications prescribed. Participant is following exercise prescription and nutrition guidelines.;Long Term: Cholesterol controlled with medications as prescribed, with individualized exercise RX and with personalized nutrition plan. Value  goals: LDL < 70mg , HDL > 40 mg.   Stress Yes   Intervention Offer individual and/or small group education and counseling on adjustment to heart disease, stress management and health-related lifestyle change. Teach and support self-help strategies.;Refer participants experiencing significant psychosocial distress to appropriate mental health specialists for further evaluation and treatment. When possible, include family members and significant others in education/counseling sessions.   Expected Outcomes Short Term: Participant demonstrates changes in health-related behavior, relaxation and other stress management skills, ability to obtain effective social support, and compliance with psychotropic medications if prescribed.;Long Term: Emotional wellbeing is indicated by absence of clinically significant psychosocial distress or social isolation.   Personal Goal Improve exercise tolerance and avoid open heart surgery and heart disease progression      Core Components/Risk Factors/Patient Goals Review:    Core Components/Risk Factors/Patient Goals at Discharge (Final Review):    ITP Comments:     ITP Comments    Row Name 03/07/16 0842           ITP Comments Medical Director:  Dr. Armanda Magic           Comments: Pt started cardiac rehab today.  Pt tolerated light exercise without difficulty. VSS, telemetry-sinus  rhythm, wide QRS, occ PVC.  Pt c/o orthostatic dizziness post exercise.  BP:  103/64 sitting, 76/51 standing, with dizziness.  Pt given gatorade.  Recheck BP:  102/54 sitting, 99/64 standing.  Pt admits he only ate a few "bites" of breakfast and very little PO fluid intake prior rehab.  Pt also held AM medication.  PC to Dr. Kathlyn [place] office to advise.  Meghan instructed pt to hold diuretics today.  Pt verbalized understanding.    Medication list reconciled. Pt denies barriers to medicaiton compliance.  PSYCHOSOCIAL ASSESSMENT:  PHQ-0, although pt displays stress and anxiety pattern.   Pt c/o caregiver fatigue from living with his mother to assist in her care. Pt is in the process of moving to his own residence. Pt reports he is currently smoking 1/2-1ppd which he states is a stress coping mechanism.  Pt encouraged to contact 1800quitnow to learn effective cessation tips.  Pt also encouraged to participate in exercise, education (especially stress and meditation) to lower his stress level.  Pt is scheduled to return to work soon and he is hopeful he will be able to resume expected job duties.    Pt enjoys watching movies.  Pt goals for cardiac rehab is to improve his heart health.  Pt is hopeful he will be able to increase his EF.  Pt counseled on appropriate lifestyle modifications to increase his ability to achieve this goal, especially in the presence of his advanced disease.    Pt oriented to exercise equipment and routine.    Understanding verbalized.

## 2016-03-15 ENCOUNTER — Encounter (HOSPITAL_COMMUNITY): Payer: BLUE CROSS/BLUE SHIELD

## 2016-03-15 ENCOUNTER — Encounter (HOSPITAL_COMMUNITY)
Admission: RE | Admit: 2016-03-15 | Discharge: 2016-03-15 | Disposition: A | Discharge status: Home / Self Care | Payer: BLUE CROSS/BLUE SHIELD | Source: Ambulatory Visit | Attending: Cardiovascular Disease | Admitting: Cardiovascular Disease

## 2016-03-15 DIAGNOSIS — I2510 Atherosclerotic heart disease of native coronary artery without angina pectoris: Secondary | ICD-10-CM | POA: Diagnosis not present

## 2016-03-15 DIAGNOSIS — Z955 Presence of coronary angioplasty implant and graft: Secondary | ICD-10-CM

## 2016-03-15 LAB — GLUCOSE, CAPILLARY
GLUCOSE-CAPILLARY: 205 mg/dL — AB (ref 65–99)
Glucose-Capillary: 154 mg/dL — ABNORMAL HIGH (ref 65–99)

## 2016-03-15 NOTE — Progress Notes (Signed)
Dalton KindsLarry D [name] 61 y.o. male Nutrition Note Spoke with pt. Nutrition Plan and Nutrition Survey goals reviewed with pt. Pt is following Step 1 of the Therapeutic Lifestyle Changes diet.  Pt is diabetic. Last A1c indicates blood glucose not optimally controlled. Per discussion, pt's A1c has been "in the 9.5-10 something." This Clinical research associatewriter went over Diabetes Education test results. Pt checks CBG's once daily "sometimes more if I need to." Pt states he does not check his CBG's as "often as you want me to because I have to pay for strips." Pt is aware of the ReliOn glucometer/test strips that are less expensive, but pt does not like the ReliOn meter and chooses to use the Accucheck meter "because it's easier to use." Fasting CBG's reportedly ~180 mg/dL. Pt with dx of CHF. Per discussion, pt looks for foods that have 200 mg of sodium or less per serving. Pt does eat some high sodium foods (e.g. Pork sausage and [employer] fries 3-4 times/week). Pt reports he is on a 2 L fluid restriction, "which I'm pretty sure I go over sometimes." Pt continues to work toward tobacco cessation and is smoking "1/2 a pack a day." Pt expressed understanding of the information reviewed. Pt aware of nutrition education classes offered.  Lab Results  Component Value Date   HGBA1C 7.6 (H) 01/05/2016   Wt Readings from Last 3 Encounters:  03/10/16 220 lb 12.8 oz (100.2 kg)  03/07/16 224 lb 6.9 oz (101.8 kg)  02/08/16 208 lb 12.8 oz (94.7 kg)    Nutrition Diagnosis Food-and nutrition-related knowledge deficit related to lack of exposure to information as related to diagnosis of: ? CVD ? DM Obesity related to excessive energy intake as evidenced by a BMI of 30.5  Nutrition Intervention ? Pt's individual nutrition plan reviewed with pt. ? Benefits of adopting Therapeutic Lifestyle Changes discussed when Medficts reviewed. ? Consider adding vitamin C supplement daily or BID ? Pt to attend the Portion Distortion class ? Pt to attend  the Diabetes Q & A class ? Pt to attend the   ? Nutrition I class                   ? Nutrition II class     ? Diabetes Blitz class ? Continue client-centered nutrition education by RD, as part of interdisciplinary care. Goal(s) ? Pt to identify and limit food sources of saturated fat, trans fat, and sodium ? CBG concentrations in the normal range or as close to normal as is safely possible. Monitor and Evaluate progress toward nutrition goal with team. Mickle PlumbEdna Asheton [name], M.Ed, RD, LDN, CDE 03/15/2016 11:16 AM

## 2016-03-17 ENCOUNTER — Encounter (HOSPITAL_COMMUNITY)
Admission: RE | Admit: 2016-03-17 | Discharge: 2016-03-17 | Disposition: A | Discharge status: Home / Self Care | Payer: BLUE CROSS/BLUE SHIELD | Source: Ambulatory Visit | Attending: Cardiovascular Disease | Admitting: Cardiovascular Disease

## 2016-03-17 ENCOUNTER — Encounter (HOSPITAL_COMMUNITY): Payer: BLUE CROSS/BLUE SHIELD

## 2016-03-17 DIAGNOSIS — I2510 Atherosclerotic heart disease of native coronary artery without angina pectoris: Secondary | ICD-10-CM | POA: Diagnosis not present

## 2016-03-17 DIAGNOSIS — Z955 Presence of coronary angioplasty implant and graft: Secondary | ICD-10-CM

## 2016-03-17 LAB — GLUCOSE, CAPILLARY: GLUCOSE-CAPILLARY: 214 mg/dL — AB (ref 65–99)

## 2016-03-19 ENCOUNTER — Telehealth: Payer: Self-pay

## 2016-03-19 NOTE — Telephone Encounter (Signed)
Left message on patient's voicemail regarding Heart Failure Research. Would like for him to call me back regarding an appointment time.

## 2016-03-20 ENCOUNTER — Encounter (HOSPITAL_COMMUNITY): Payer: Self-pay

## 2016-03-20 ENCOUNTER — Encounter (HOSPITAL_COMMUNITY)
Admission: RE | Admit: 2016-03-20 | Discharge: 2016-03-20 | Disposition: A | Discharge status: Home / Self Care | Payer: BLUE CROSS/BLUE SHIELD | Source: Ambulatory Visit | Attending: Cardiovascular Disease | Admitting: Cardiovascular Disease

## 2016-03-20 ENCOUNTER — Encounter (HOSPITAL_COMMUNITY): Payer: BLUE CROSS/BLUE SHIELD

## 2016-03-20 ENCOUNTER — Ambulatory Visit (HOSPITAL_COMMUNITY)
Admission: RE | Admit: 2016-03-20 | Discharge: 2016-03-20 | Disposition: A | Discharge status: Home / Self Care | Payer: BLUE CROSS/BLUE SHIELD | Source: Ambulatory Visit | Attending: Cardiology | Admitting: Cardiology

## 2016-03-20 DIAGNOSIS — I2510 Atherosclerotic heart disease of native coronary artery without angina pectoris: Secondary | ICD-10-CM | POA: Diagnosis not present

## 2016-03-20 DIAGNOSIS — I5022 Chronic systolic (congestive) heart failure: Secondary | ICD-10-CM | POA: Diagnosis present

## 2016-03-20 DIAGNOSIS — Z955 Presence of coronary angioplasty implant and graft: Secondary | ICD-10-CM

## 2016-03-20 DIAGNOSIS — Z006 Encounter for examination for normal comparison and control in clinical research program: Secondary | ICD-10-CM

## 2016-03-20 DIAGNOSIS — I5023 Acute on chronic systolic (congestive) heart failure: Secondary | ICD-10-CM

## 2016-03-20 LAB — BASIC METABOLIC PANEL
Anion gap: 10 (ref 5–15)
BUN: 40 mg/dL — ABNORMAL HIGH (ref 6–20)
CHLORIDE: 90 mmol/L — AB (ref 101–111)
CO2: 30 mmol/L (ref 22–32)
CREATININE: 1.18 mg/dL (ref 0.61–1.24)
Calcium: 9.4 mg/dL (ref 8.9–10.3)
GFR calc non Af Amer: >60 mL/min (ref >60)
GLUCOSE: 136 mg/dL — AB (ref 65–99)
Potassium: 4 mmol/L (ref 3.5–5.1)
Sodium: 130 mmol/L — ABNORMAL LOW (ref 135–145)

## 2016-03-20 LAB — GLUCOSE, CAPILLARY: Glucose-Capillary: 249 mg/dL — ABNORMAL HIGH (ref 65–99)

## 2016-03-20 NOTE — Progress Notes (Signed)
Reviewed home exercise with pt today.  Pt plans to walk and use recumbent bike for exercise, 2x/week in addition to coming to cardiac rehab.  Reviewed THR, pulse, RPE, sign and symptoms, NTG use, and when to call 911 or MD.  Also discussed weather considerations and indoor options.  Pt voiced understanding.     Tyller Bowlby [employer],MS,ACSM RCEP

## 2016-03-20 NOTE — Progress Notes (Signed)
Paperwork picked up by patient after Dr. Shirlee LatchMcLean completed and signed for patient to return to work per patient request. Copy scanned into patient's electronic medical record under media tab for reference.  [name], Dalton Miyazaki [name], RN

## 2016-03-22 ENCOUNTER — Encounter (HOSPITAL_COMMUNITY): Payer: BLUE CROSS/BLUE SHIELD

## 2016-03-22 ENCOUNTER — Encounter (HOSPITAL_COMMUNITY)
Admission: RE | Admit: 2016-03-22 | Discharge: 2016-03-22 | Disposition: A | Discharge status: Home / Self Care | Payer: BLUE CROSS/BLUE SHIELD | Source: Ambulatory Visit | Attending: Cardiovascular Disease | Admitting: Cardiovascular Disease

## 2016-03-22 ENCOUNTER — Encounter (HOSPITAL_COMMUNITY): Payer: Self-pay

## 2016-03-22 DIAGNOSIS — Z955 Presence of coronary angioplasty implant and graft: Secondary | ICD-10-CM

## 2016-03-22 DIAGNOSIS — I2510 Atherosclerotic heart disease of native coronary artery without angina pectoris: Secondary | ICD-10-CM | POA: Diagnosis not present

## 2016-03-22 LAB — GLUCOSE, CAPILLARY: GLUCOSE-CAPILLARY: 345 mg/dL — AB (ref 65–99)

## 2016-03-22 NOTE — Progress Notes (Signed)
Patient present to screen for BeAT-HF Study. Patient read consent. Questions answered and consent signed before any study related procedures occurred. Patient states he has suffered with heart failure for many years. States "I am trying to limit my salt intake but it is very hard as lower salt/healthier foods cost more." Patient is participating in Cardiac Rehab and states "it is all I can do to keep going." Verbalizes shortness of breath with minimal activity, such as showering, often he has to sit and rest in between shower and dressing. No labs done today as recent BNP and eGFR are applicable for study inclusion. Will schedule echo. Dr. Trula Slade requesting CTA of neck for definitive percent of bilateral carotid stenosis. Coordinator will reach out to sponsor with request. CTA of neck explained to patient and he desires to proceed if test is approved. Will follow up with patient in 1-2 days.

## 2016-03-22 NOTE — Progress Notes (Signed)
Incomplete Session Note  Patient Details  Name: [name] MRN: [id number] Date of Birth: December 19, 1955 Referring Provider:     CARDIAC REHAB PHASE [facility]  Referring Provider  Marca AnconaMcLean, Dalton MD      Dalton RainwaterLarry D Bozarth did not complete his rehab session.  Pt pre exercise blood glucose was 345.  Pt remarked that when he checked his blood glucose at home it was 369.  Pt ate two beef burritos for dinner.  Pt advised not to exercise today per cardiac rehab protocol. Pt instructed to drink water within his fluid restriction limitation and monitor his blood glucose throughout the day. Pt declined offered diabetes educator consult.  Pt verbalized understanding.  Alanson Alyarlette Caiden Arteaga RN, BSN Cardiac and Emergency planning/management officerulmonary Rehab Nurse Navigator

## 2016-03-23 ENCOUNTER — Telehealth: Payer: Self-pay

## 2016-03-23 DIAGNOSIS — I6523 Occlusion and stenosis of bilateral carotid arteries: Secondary | ICD-10-CM

## 2016-03-23 NOTE — Telephone Encounter (Signed)
Patient called and agrees with plan of care to continue forward in Beat HF screening process with CT[facility] neck 3[facility]. Will come up and see study coordinator tomorrow after cardiac rehab. Very pleasant.

## 2016-03-23 NOTE — Telephone Encounter (Signed)
Left message regarding next screening steps of BeAT-HF Study. Request patient call me back. [phone].

## 2016-03-24 ENCOUNTER — Encounter (HOSPITAL_COMMUNITY): Payer: BLUE CROSS/BLUE SHIELD

## 2016-03-24 ENCOUNTER — Telehealth (HOSPITAL_COMMUNITY): Payer: Self-pay | Admitting: *Deleted

## 2016-03-24 ENCOUNTER — Ambulatory Visit (HOSPITAL_COMMUNITY)
Admission: RE | Admit: 2016-03-24 | Discharge: 2016-03-24 | Disposition: A | Discharge status: Home / Self Care | Payer: BLUE CROSS/BLUE SHIELD | Source: Ambulatory Visit | Attending: Internal Medicine | Admitting: Internal Medicine

## 2016-03-24 ENCOUNTER — Ambulatory Visit (HOSPITAL_COMMUNITY)
Admission: RE | Admit: 2016-03-24 | Discharge: 2016-03-24 | Disposition: A | Discharge status: Home / Self Care | Payer: BLUE CROSS/BLUE SHIELD | Source: Ambulatory Visit | Attending: Cardiology | Admitting: Cardiology

## 2016-03-24 ENCOUNTER — Encounter (HOSPITAL_COMMUNITY): Payer: Self-pay | Admitting: *Deleted

## 2016-03-24 ENCOUNTER — Encounter (HOSPITAL_COMMUNITY)
Admission: RE | Admit: 2016-03-24 | Discharge: 2016-03-24 | Disposition: A | Discharge status: Home / Self Care | Payer: BLUE CROSS/BLUE SHIELD | Source: Ambulatory Visit | Attending: Cardiovascular Disease | Admitting: Cardiovascular Disease

## 2016-03-24 DIAGNOSIS — I700 Atherosclerosis of aorta: Secondary | ICD-10-CM | POA: Insufficient documentation

## 2016-03-24 DIAGNOSIS — J90 Pleural effusion, not elsewhere classified: Secondary | ICD-10-CM | POA: Insufficient documentation

## 2016-03-24 DIAGNOSIS — Z955 Presence of coronary angioplasty implant and graft: Secondary | ICD-10-CM

## 2016-03-24 DIAGNOSIS — J9811 Atelectasis: Secondary | ICD-10-CM | POA: Insufficient documentation

## 2016-03-24 DIAGNOSIS — I5023 Acute on chronic systolic (congestive) heart failure: Secondary | ICD-10-CM

## 2016-03-24 DIAGNOSIS — I2510 Atherosclerotic heart disease of native coronary artery without angina pectoris: Secondary | ICD-10-CM | POA: Diagnosis not present

## 2016-03-24 LAB — GLUCOSE, CAPILLARY
GLUCOSE-CAPILLARY: 153 mg/dL — AB (ref 65–99)
Glucose-Capillary: 207 mg/dL — ABNORMAL HIGH (ref 65–99)

## 2016-03-24 MED ORDER — PERFLUTREN LIPID MICROSPHERE
1.0000 mL | INTRAVENOUS | Status: AC | PRN
  Administered 2016-03-24: 2 mL via INTRAVENOUS
  Filled 2016-03-24: qty 10

## 2016-03-24 NOTE — Progress Notes (Signed)
  Echocardiogram 2D Echocardiogram has been performed.  Arvil ChacoFoster, Tenishia Ekman 03/24/2016, 12:51 PM

## 2016-03-24 NOTE — Telephone Encounter (Signed)
Order placed, pt will have done today

## 2016-03-24 NOTE — Telephone Encounter (Signed)
-----   Message from Laurey Moralealton S McLean, MD sent at 03/12/2016  2:55 PM EDT ----- Please order a CXR PA/lateral with right lateral decubitus film on Mr Kneebone to followup on his pleural effusion.

## 2016-03-27 ENCOUNTER — Encounter (HOSPITAL_COMMUNITY)
Admission: RE | Admit: 2016-03-27 | Discharge: 2016-03-27 | Disposition: A | Discharge status: Home / Self Care | Payer: BLUE CROSS/BLUE SHIELD | Source: Ambulatory Visit | Attending: Cardiovascular Disease | Admitting: Cardiovascular Disease

## 2016-03-27 ENCOUNTER — Encounter (HOSPITAL_COMMUNITY): Payer: BLUE CROSS/BLUE SHIELD

## 2016-03-27 ENCOUNTER — Other Ambulatory Visit: Payer: Self-pay | Admitting: Internal Medicine

## 2016-03-27 DIAGNOSIS — Z955 Presence of coronary angioplasty implant and graft: Secondary | ICD-10-CM

## 2016-03-27 DIAGNOSIS — I2510 Atherosclerotic heart disease of native coronary artery without angina pectoris: Secondary | ICD-10-CM | POA: Diagnosis not present

## 2016-03-27 LAB — GLUCOSE, CAPILLARY: Glucose-Capillary: 105 mg/dL — ABNORMAL HIGH (ref 65–99)

## 2016-03-28 ENCOUNTER — Encounter (HOSPITAL_COMMUNITY): Payer: Self-pay

## 2016-03-28 ENCOUNTER — Ambulatory Visit (HOSPITAL_COMMUNITY)
Admission: RE | Admit: 2016-03-28 | Discharge: 2016-03-28 | Disposition: A | Discharge status: Home / Self Care | Payer: BLUE CROSS/BLUE SHIELD | Source: Ambulatory Visit | Attending: Surgery | Admitting: Surgery

## 2016-03-28 DIAGNOSIS — I6523 Occlusion and stenosis of bilateral carotid arteries: Secondary | ICD-10-CM

## 2016-03-28 DIAGNOSIS — Z006 Encounter for examination for normal comparison and control in clinical research program: Secondary | ICD-10-CM

## 2016-03-28 MED ORDER — IOPAMIDOL (ISOVUE-370) INJECTION 76%
100.0000 mL | Freq: Once | INTRAVENOUS | Status: AC | PRN
  Administered 2016-03-28: 100 mL via INTRAVENOUS

## 2016-03-28 MED ORDER — IOPAMIDOL (ISOVUE-370) INJECTION 76%
INTRAVENOUS | Status: AC
  Filled 2016-03-28: qty 100

## 2016-03-28 NOTE — Progress Notes (Signed)
Patient in screening for BeAT-HF study, stopped by after CTA of neck, EKG obtained as it was omitted in prior visit. He states "I feel ok today, I have let my blood sugars get out of control over the last few days and was not able to participate in cardiac rehab yesterday." Writer also noticed dry area on RLE that is changed since last assessment. Patient states this is an old sore, as he scratched it with his fingernail. Dry skin flaked off, stability of skin questionable as it appears very thin. Patient states he does have weeping from open areas on his legs from time to time; he denies open areas at this time. Writer will schedule an appointment with Dr. Myra GianottiBrabham to assess patient. He has gone back to work night shift starting last night. States "it was a long night, but I made it. If I were put in the device group, I would not be able to do surgery until until end of April or into May. I did not expect to go back to work, but now that I did I do not have any leave time."  Explained to him that I will speak with Dr. Johney FrameAllred and Dr. Myra GianottiBrabham and will call him tomorrow. Awaiting results of CTA as well.

## 2016-03-28 NOTE — Progress Notes (Signed)
Cardiac Individual Treatment Plan  Patient Details  Name: [name] MRN: [id number] Date of Birth: [birth year] Referring Provider:     CARDIAC REHAB PHASE II ORIENTATION from 03/07/2016 in [facility] CARDIAC REHAB  Referring Provider  Marca Ancona MD      Initial Encounter Date:    CARDIAC REHAB PHASE II ORIENTATION from 03/07/2016 in [facility] CARDIAC REHAB  Date  03/07/16  Referring Provider  Marca Ancona MD      Visit Diagnosis: Stented coronary artery  Patient's Home Medications on Admission:  Current Outpatient Prescriptions:  .  aspirin 81 MG tablet, Take 1 tablet (81 mg total) by mouth daily., Disp: 30 tablet, Rfl: 3 .  atorvastatin (LIPITOR) 80 MG tablet, Take 1 tablet (80 mg total) by mouth every other day., Disp: 30 tablet, Rfl: 11 .  carvedilol (COREG) 6.25 MG tablet, Take 3.125 mg by mouth 2 (two) times daily with a meal., Disp: , Rfl:  .  clopidogrel (PLAVIX) 75 MG tablet, Take 1 tablet (75 mg total) by mouth daily., Disp: 30 tablet, Rfl: 6 .  digoxin (LANOXIN) 0.125 MG tablet, Take 1 tablet (0.125 mg total) by mouth daily., Disp: 30 tablet, Rfl: 11 .  ezetimibe (ZETIA) 10 MG tablet, TAKE ONE TABLET BY MOUTH ONCE DAILY, Disp: 30 tablet, Rfl: 6 .  insulin NPH-regular Human (NOVOLIN 70/30) (70-30) 100 UNIT/ML injection, Inject 16-32 Units into the skin 2 (two) times daily with a meal. 32 units in the morning, 16 units in the evening, Disp: , Rfl:  .  losartan (COZAAR) 25 MG tablet, Take 25 mg by mouth daily., Disp: , Rfl:  .  metolazone (ZAROXOLYN) 2.5 MG tablet, Take 2.5 mg (1 tablet) once weekly on Saturday mornings., Disp: 90 tablet, Rfl: 3 .  potassium chloride SA (K-DUR,KLOR-CON) 20 MEQ tablet, Take 1 tablet (20 mEq total) by mouth every other day. Take extra 2 meq (1 tablet) on Saturdays with metolazone., Disp: 20 tablet, Rfl: 6 .  spironolactone (ALDACTONE) 25 MG tablet, Take 1 tablet (25 mg total) by mouth daily., Disp: 30  tablet, Rfl: 0 .  torsemide (DEMADEX) 20 MG tablet, Take 80 mg by mouth 2 (two) times daily., Disp: , Rfl:  No current facility-administered medications for this encounter.   Facility-Administered Medications Ordered in Other Encounters:  .  iopamidol (ISOVUE-370) 76 % injection, , , ,   Past Medical History: Past Medical History:  Diagnosis Date  . AICD (automatic cardioverter/defibrillator) present   . Barretts syndrome   . Carotid artery disease (HCC)    a. Dopp 09/2011: 60-79% RICA, 40-59% LICA.;  b.  Carotid US (7/14):  Bilateral 60-79% => f/u 6 mos  . Cerebrovascular disease   . Chronic systolic heart failure (HCC)   . Coronary artery disease    a. AWMI requiring IABP 2005 s/p Horizon study stent-LAD, staged BMS-Cx, DESx2-RCA. b. Last Last LHC (6/06):  EF 25%, pLAD stent 40-50, after stent 40, dLAD 20, pCFX 20, pOM1 70, pRCA 20, mRCA stents ok.=> med Rx;  c.  Myoview (7/14):  EF 26%, large ant, septal, inf and apical infarct, no ischemia.  Med Rx continued  . Diabetes mellitus   . Heart murmur   . History of kidney stones   . HTN (hypertension)   . Hyperlipidemia    mixed  . ICD (implantable cardiac defibrillator), dual, in situ    St. Jude for severe LVD EF 25% 2/07 explanted 2010. Medtronic Virtuoso II DR Dual-chamber cardiverter-defibrillator  with pocket revision, Dr. Graciela Husbands  . Ischemic cardiomyopathy    a. Echo (09/27/12): EF 20%, diffuse HK with periapical AK, no LV thrombus noted, restrictive physiology, trivial MR, mild LAE, RVSF mildly reduced, PASP 39  . Pleural effusion 01/05/2016  . Presence of permanent cardiac pacemaker   . PVD (peripheral vascular disease) (HCC)    a. ABI 09/2011 - R normal, L moderate - saw Dr. Kirke Corin - med rx.   Marland Kitchen RIATA ICD Lead--on advisory recall    . Tobacco abuse     Tobacco Use: History  Smoking Status  . Current Every Day Smoker  . Packs/day: 1.00  . Years: 45.00  . Types: Cigarettes  Smokeless Tobacco  . Never Used    Comment: pt  reports he is stress smoker    Labs: Recent Review Flowsheet Data    Labs for ITP Cardiac and Pulmonary Rehab Latest Ref Rng & Units 01/28/2016 01/28/2016 01/28/2016 02/01/2016 03/10/2016   Cholestrol 0 - 200 mg/dL - - - 161 096   LDLCALC 0 - 99 mg/dL - - - - 45   LDLDIRECT mg/dL - - - - -   HDL >04 mg/dL - - - - 49   Trlycerides <150 mg/dL - - - - 43   Hemoglobin A1c 4.8 - 5.6 % - - - - -   PHART 7.350 - 7.450 - 7.460(H) - - -   PCO2ART 32.0 - 48.0 mmHg - 36.2 - - -   HCO3 20.0 - 28.0 mmol/L 27.4 25.7 29.1(H) - -   TCO2 0 - 100 mmol/L 29 27 30  - -   O2SAT % 55.0 97.0 57.0 - -      Capillary Blood Glucose: Lab Results  Component Value Date   GLUCAP 105 (H) 03/27/2016   GLUCAP 153 (H) 03/24/2016   GLUCAP 207 (H) 03/24/2016   GLUCAP 345 (H) 03/22/2016   GLUCAP 249 (H) 03/20/2016     Exercise Target Goals:    Exercise Program Goal: Individual exercise prescription set with THRR, safety & activity barriers. Participant demonstrates ability to understand and report RPE using BORG scale, to self-measure pulse accurately, and to acknowledge the importance of the exercise prescription.  Exercise Prescription Goal: Starting with aerobic activity 30 plus minutes a day, 3 days per week for initial exercise prescription. Provide home exercise prescription and guidelines that participant acknowledges understanding prior to discharge.  Activity Barriers & Risk Stratification:     Activity Barriers & Cardiac Risk Stratification - 03/07/16 0908      Activity Barriers & Cardiac Risk Stratification   Activity Barriers Deconditioning;Muscular Weakness;Balance Concerns;History of Falls   Cardiac Risk Stratification High      6 Minute Walk:     6 Minute Walk    Row Name 03/07/16 1101         6 Minute Walk   Phase Initial     Distance 790 feet     Walk Time 3 minutes     # of Rest Breaks 1  1 minute rest break     MPH 2.99     METS 2.35     RPE 14     VO2 Peak 8.23      Symptoms Yes (comment)     Comments B legs/hip pain8/10      Resting HR 91 bpm     Resting BP 120/71     Max Ex. HR 97 bpm     Max Ex. BP 130/60     2  Minute Post BP 106/74        Oxygen Initial Assessment:   Oxygen Re-Evaluation:   Oxygen Discharge (Final Oxygen Re-Evaluation):   Initial Exercise Prescription:     Initial Exercise Prescription - 03/07/16 1100      Date of Initial Exercise RX and Referring Provider   Date 03/07/16   Referring Provider Marca Ancona MD     Recumbant Bike   Level 1.5   Minutes 10   METs 1.4     NuStep   Level 2   SPM 85   Minutes 10   METs 1.8     Arm Ergometer   Level 1   Minutes 10   METs 1     Prescription Details   Frequency (times per week) 3   Duration Progress to 30 minutes of continuous aerobic without signs/symptoms of physical distress     Intensity   THRR 40-80% of Max Heartrate 64-127   Ratings of Perceived Exertion 11-13   Perceived Dyspnea 0-4     Progression   Progression Continue to progress workloads to maintain intensity without signs/symptoms of physical distress.     Resistance Training   Training Prescription Yes   Weight 2lbs   Reps 10-15      Perform Capillary Blood Glucose checks as needed.  Exercise Prescription Changes:      Exercise Prescription Changes    Row Name 03/14/16 1400 03/27/16 1600           Response to Exercise   Blood Pressure (Admit) 100/60 98/60      Blood Pressure (Exercise) 104/60 118/60      Blood Pressure (Exit) 102/54 108/68      Heart Rate (Admit) 76 bpm 79 bpm      Heart Rate (Exercise) 95 bpm 99 bpm      Heart Rate (Exit) 78 bpm 79 bpm      Rating of Perceived Exertion (Exercise) 13 13      Symptoms claudication pain  claudication pain       Comments pt x/o dizziness with weights pt x/o dizziness with weights      Duration Progress to 30 minutes of  aerobic without signs/symptoms of physical distress Progress to 30 minutes of  aerobic without  signs/symptoms of physical distress      Intensity THRR unchanged THRR unchanged        Resistance Training   Training Prescription Yes Yes      Weight 1lb 3lbs      Reps 10-15 10-15      Time 10 Minutes 10 Minutes        Recumbant Bike   Level 1.5 1.5      Minutes 10 10      METs 1.4 2.6        NuStep   Level 2 3      SPM 85 85      Minutes 10 10      METs 1.9 2.1        Arm Ergometer   Level 1 1      Minutes 10 10      METs 1 2.3        Home Exercise Plan   Plans to continue exercise at  - Home (comment)      Frequency  - Add 2 additional days to program exercise sessions.      Initial Home Exercises Provided  - 03/20/16         Exercise Comments:  Exercise Comments    Row Name 03/23/16 [phone]           Exercise Comments Reviewed METs and goals. Pt is tolerating exercise well; will continue to monitor exercise progression.          Exercise Goals and Review:      Exercise Goals    Row Name 03/07/16 [phone]             Exercise Goals   Increase Physical Activity Yes       Intervention Provide advice, education, support and counseling about physical activity/exercise needs.;Develop an individualized exercise prescription for aerobic and resistive training based on initial evaluation findings, risk stratification, comorbidities and participant's personal goals.       Expected Outcomes Achievement of increased cardiorespiratory fitness and enhanced flexibility, muscular endurance and strength shown through measurements of functional capacity and personal statement of participant.       Increase Strength and Stamina Yes       Intervention Develop an individualized exercise prescription for aerobic and resistive training based on initial evaluation findings, risk stratification, comorbidities and participant's personal goals.;Provide advice, education, support and counseling about physical activity/exercise needs.       Expected Outcomes Achievement of  increased cardiorespiratory fitness and enhanced flexibility, muscular endurance and strength shown through measurements of functional capacity and personal statement of participant.          Exercise Goals Re-Evaluation :     Exercise Goals Re-Evaluation    Row Name 03/20/16 0912 03/23/16 0751           Exercise Goal Re-Evaluation   Exercise Goals Review Increase Physical Activity;Increase Strenth and Stamina Increase Physical Activity;Increase Strenth and Stamina      Comments Reviewed home exercise with pt today.  Pt plans to walk and use recumbent bike for exercise, 2x/week in addition to coming to cardiac rehab.  Reviewed THR, pulse, RPE, sign and symptoms, NTG use, and when to call 911 or MD.  Also discussed weather considerations and indoor options.  Pt voiced understanding. Pt stated " feeling stronger in legs and having more energy" pt also reported "having some mild SOB with activities but overall feeling better"      Expected Outcomes Pt will be compliant with HEP and improve in cardiorespiratory fitness. Pt will continue to improve in cardiorespiratory fitness and improve symptoms of SOB with functional activities/ ADL's.          Discharge Exercise Prescription (Final Exercise Prescription Changes):     Exercise Prescription Changes - 03/27/16 1600      Response to Exercise   Blood Pressure (Admit) 98/60   Blood Pressure (Exercise) 118/60   Blood Pressure (Exit) 108/68   Heart Rate (Admit) 79 bpm   Heart Rate (Exercise) 99 bpm   Heart Rate (Exit) 79 bpm   Rating of Perceived Exertion (Exercise) 13   Symptoms claudication pain    Comments pt x/o dizziness with weights   Duration Progress to 30 minutes of  aerobic without signs/symptoms of physical distress   Intensity THRR unchanged     Resistance Training   Training Prescription Yes   Weight 3lbs   Reps 10-15   Time 10 Minutes     Recumbant Bike   Level 1.5   Minutes 10   METs 2.6     NuStep   Level 3    SPM 85   Minutes 10   METs 2.1     Arm Ergometer   Level 1  Minutes 10   METs 2.3     Home Exercise Plan   Plans to continue exercise at Home (comment)   Frequency Add 2 additional days to program exercise sessions.   Initial Home Exercises Provided 03/20/16      Nutrition:  Target Goals: Understanding of nutrition guidelines, daily intake of sodium 1500mg , cholesterol 200mg , calories 30% from fat and 7% or less from saturated fats, daily to have 5 or more servings of fruits and vegetables.  Biometrics:     Pre Biometrics - 03/07/16 1111      Pre Biometrics   Waist Circumference 42.5 inches   Hip Circumference 43 inches   Waist to Hip Ratio 0.99 %   Triceps Skinfold 22 mm   % Body Fat 30.6 %   Grip Strength 30 kg   Flexibility 12.5 in   Single Leg Stand 1.66 seconds       Nutrition Therapy Plan and Nutrition Goals:     Nutrition Therapy & Goals - 03/15/16 1126      Nutrition Therapy   Diet Carb Modified, Therapeutic Lifestyle Changes     Personal Nutrition Goals   Nutrition Goal Pt to identify and limit food sources of saturated fat, trans fat, and sodium   Personal Goal #2 CBG's as close to normal as safely possible.      Intervention Plan   Intervention Prescribe, educate and counsel regarding individualized specific dietary modifications aiming towards targeted core components such as weight, hypertension, lipid management, diabetes, heart failure and other comorbidities.   Expected Outcomes Short Term Goal: Understand basic principles of dietary content, such as calories, fat, sodium, cholesterol and nutrients.;Long Term Goal: Adherence to prescribed nutrition plan.      Nutrition Discharge: Nutrition Scores:     Nutrition Assessments - 03/15/16 1127      MEDFICTS Scores   Pre Score 66      Nutrition Goals Re-Evaluation:   Nutrition Goals Re-Evaluation:   Nutrition Goals Discharge (Final Nutrition Goals  Re-Evaluation):   Psychosocial: Target Goals: Acknowledge presence or absence of significant depression and/or stress, maximize coping skills, provide positive support system. Participant is able to verbalize types and ability to use techniques and skills needed for reducing stress and depression.  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  He currently lives in her home but is hopeful he will be moving soon.       Barriers   Psychosocial barriers to participate in program The patient should benefit from training in stress management and relaxation.     Screening Interventions   Interventions Encouraged to exercise;To provide support and resources with identified psychosocial needs;Provide feedback about the scores to participant      Quality of Life Scores:     Quality of Life - 03/22/16 0929      Quality of Life Scores   Health/Function Pre 21.04 %  scores reflect pt concerns about CV symptoms. pt with continued fatigue and deconditioning. pt restricted in physical abilities. pt is looking forward to possible BEAT HF research participation.     Socioeconomic Pre 22.5 %   Psych/Spiritual Pre 23.79 %   Family Pre 23.63 %  pt is working toward eliminating family stressors.  pt exhibits positive coping skills with hopeful outlook. unfortunately, pt uses tobacco as stress reliever.  GLOBAL Pre 22.28 %  pt enjoys his children and 3 grandchildren.  pt expresses desire to decrease his CAD RF.       PHQ-9: Recent Review Flowsheet Data    Depression screen [facility] 2/9 03/13/2016   Decreased Interest 0   Down, Depressed, Hopeless 0   PHQ - 2 Score 0     Interpretation of Total Score  Total Score Depression Severity:  1-4 = Minimal depression, 5-9 = Mild depression, 10-14 = Moderate depression, 15-19 = Moderately severe depression,  20-27 = Severe depression   Psychosocial Evaluation and Intervention:     Psychosocial Evaluation - 03/28/16 1542      Psychosocial Evaluation & Interventions   Interventions Relaxation education;Encouraged to exercise with the program and follow exercise prescription;Stress management education   Comments pt is working towards decreasing family related stress. pt does admit to using tobacco use as stress reliever.  pt encouraged to decrease or stop tobacco.     Expected Outcomes pt will demonstrate improved coping skills and positive outlook.  pt will work towards committing to tobacco cessation.     Continue Psychosocial Services  Follow up required by staff      Psychosocial Re-Evaluation:   Psychosocial Discharge (Final Psychosocial Re-Evaluation):   Vocational Rehabilitation: Provide vocational rehab assistance to qualifying candidates.   Vocational Rehab Evaluation & Intervention:     Vocational Rehab - 03/13/16 1109      Initial Vocational Rehab Evaluation & Intervention   Assessment shows need for Vocational Rehabilitation Yes  pt denies needs, plans to return to previous employer however is unsure of his physical ability to perform job duties.  pt given voc rehab packet.     Vocational Rehab Packet given to patient 03/13/16      Education: Education Goals: Education classes will be provided on a weekly basis, covering required topics. Participant will state understanding/return demonstration of topics presented.  Learning Barriers/Preferences:     Learning Barriers/Preferences - 03/07/16 4540      Learning Barriers/Preferences   Learning Barriers Sight   Learning Preferences Written Material;Video;Pictoral      Education Topics: Count Your Pulse:  -Group instruction provided by verbal instruction, demonstration, patient participation and written materials to support subject.  Instructors address importance of being able to find your pulse and how to count  your pulse when at home without a heart monitor.  Patients get hands on experience counting their pulse with staff help and individually.   Heart Attack, Angina, and Risk Factor Modification:  -Group instruction provided by verbal instruction, video, and written materials to support subject.  Instructors address signs and symptoms of angina and heart attacks.    Also discuss risk factors for heart disease and how to make changes to improve heart health risk factors.   Functional Fitness:  -Group instruction provided by verbal instruction, demonstration, patient participation, and written materials to support subject.  Instructors address safety measures for doing things around the house.  Discuss how to get up and down off the floor, how to pick things up properly, how to safely get out of a chair without assistance, and balance training.   CARDIAC REHAB PHASE II EXERCISE from 03/24/2016 in [facility] CARDIAC REHAB  Date  03/17/16  Educator  Rosine Door, MS RCEP  Instruction Review Code  2- meets goals/outcomes      Meditation and Mindfulness:  -Group instruction provided by verbal instruction, patient participation, and written materials to support subject.  Instructor addresses importance  of mindfulness and meditation practice to help reduce stress and improve awareness.  Instructor also leads participants through a meditation exercise.    Stretching for Flexibility and Mobility:  -Group instruction provided by verbal instruction, patient participation, and written materials to support subject.  Instructors lead participants through series of stretches that are designed to increase flexibility thus improving mobility.  These stretches are additional exercise for major muscle groups that are typically performed during regular warm up and cool down.   CARDIAC REHAB PHASE II EXERCISE from 03/24/2016 in [facility] CARDIAC REHAB  Date  03/24/16  Instruction  Review Code  2- meets goals/outcomes      Hands Only CPR Anytime:  -Group instruction provided by verbal instruction, video, patient participation and written materials to support subject.  Instructors co-teach with AHA video for hands only CPR.  Participants get hands on experience with mannequins.   Nutrition I class: Heart Healthy Eating:  -Group instruction provided by PowerPoint slides, verbal discussion, and written materials to support subject matter. The instructor gives an explanation and review of the Therapeutic Lifestyle Changes diet recommendations, which includes a discussion on lipid goals, dietary fat, sodium, fiber, plant stanol/sterol esters, sugar, and the components of a well-balanced, healthy diet.   Nutrition II class: Lifestyle Skills:  -Group instruction provided by PowerPoint slides, verbal discussion, and written materials to support subject matter. The instructor gives an explanation and review of label reading, grocery shopping for heart health, heart healthy recipe modifications, and ways to make healthier choices when eating out.   Diabetes Question & Answer:  -Group instruction provided by PowerPoint slides, verbal discussion, and written materials to support subject matter. The instructor gives an explanation and review of diabetes co-morbidities, pre- and post-prandial blood glucose goals, pre-exercise blood glucose goals, signs, symptoms, and treatment of hypoglycemia and hyperglycemia, and foot care basics.   Diabetes Blitz:  -Group instruction provided by PowerPoint slides, verbal discussion, and written materials to support subject matter. The instructor gives an explanation and review of the physiology behind type 1 and type 2 diabetes, diabetes medications and rational behind using different medications, pre- and post-prandial blood glucose recommendations and Hemoglobin A1c goals, diabetes diet, and exercise including blood glucose guidelines for  exercising safely.    Portion Distortion:  -Group instruction provided by PowerPoint slides, verbal discussion, written materials, and food models to support subject matter. The instructor gives an explanation of serving size versus portion size, changes in portions sizes over the last 20 years, and what consists of a serving from each food group.   Stress Management:  -Group instruction provided by verbal instruction, video, and written materials to support subject matter.  Instructors review role of stress in heart disease and how to cope with stress positively.     Exercising on Your Own:  -Group instruction provided by verbal instruction, power point, and written materials to support subject.  Instructors discuss benefits of exercise, components of exercise, frequency and intensity of exercise, and end points for exercise.  Also discuss use of nitroglycerin and activating EMS.  Review options of places to exercise outside of rehab.  Review guidelines for sex with heart disease.   CARDIAC REHAB PHASE II EXERCISE from 03/24/2016 in [facility] CARDIAC REHAB  Date  03/22/16  Educator  Rosine Door, MS RCEP  Instruction Review Code  2- meets goals/outcomes      Cardiac Drugs I:  -Group instruction provided by verbal instruction and written materials to support subject.  Instructor reviews cardiac drug classes: antiplatelets, anticoagulants, beta blockers, and statins.  Instructor discusses reasons, side effects, and lifestyle considerations for each drug class.   Cardiac Drugs II:  -Group instruction provided by verbal instruction and written materials to support subject.  Instructor reviews cardiac drug classes: angiotensin converting enzyme inhibitors (ACE-I), angiotensin II receptor blockers (ARBs), nitrates, and calcium channel blockers.  Instructor discusses reasons, side effects, and lifestyle considerations for each drug class.   [facility]ARDIAC REHAB PHASE [facility]  [facility]  Date  03/15/16  Instruction Review Code  2- meets goals/outcomes      Anatomy and Physiology of the Circulatory System:  -Group instruction provided by verbal instruction, video, and written materials to support subject.  Reviews functional anatomy of heart, how it relates to various diagnoses, and what role the heart plays in the overall system.   Knowledge Questionnaire Score:     Knowledge Questionnaire Score - 03/07/16 1057      Knowledge Questionnaire Score   Pre Score 25/28      Core Components/Risk Factors/Patient Goals at Admission:     Personal Goals and Risk Factors at Admission - 03/22/16 1156      Core Components/Risk Factors/Patient Goals on Admission    Weight Management Obesity   Intervention Weight Management: Develop a combined nutrition and exercise program designed to reach desired caloric intake, while maintaining appropriate intake of nutrient and fiber, sodium and fats, and appropriate energy expenditure required for the weight goal.;Weight Management: Provide education and appropriate resources to help participant work on and attain dietary goals.;Weight Management/Obesity: Establish reasonable short term and long term weight goals.;Obesity: Provide education and appropriate resources to help participant work on and attain dietary goals.   Expected Outcomes Long Term: Adherence to nutrition and physical activity/exercise program aimed toward attainment of established weight goal;Short Term: Continue to assess and modify interventions until short term weight is achieved;Weight Loss: Understanding of general recommendations for a balanced deficit meal plan, which promotes 1-2 lb weight loss per week and includes a negative energy balance of [phone] kcal/d   Tobacco Cessation Yes   Intervention Assist the participant in steps to quit. Provide individualized education and counseling about committing to  Tobacco Cessation, relapse prevention, and pharmacological support that can be provided by physician.;Education officer, environmentalffer self-teaching materials, assist with locating and accessing local/national Quit Smoking programs, and support quit date choice.   Expected Outcomes Short Term: Will demonstrate readiness to quit, by selecting a quit date.;Short Term: Will quit all tobacco product use, adhering to prevention of relapse plan.;Long Term: Complete abstinence from all tobacco products for at least 12 months from quit date.   Diabetes Yes   Intervention Provide education about signs/symptoms and action to take for hypo/hyperglycemia.;Provide education about proper nutrition, including hydration, and aerobic/resistive exercise prescription along with prescribed medications to achieve blood glucose in normal ranges: Fasting glucose 65-99 mg/dL   Expected Outcomes Short Term: Participant verbalizes understanding of the signs/symptoms and immediate care of hyper/hypoglycemia, proper foot care and importance of medication, aerobic/resistive exercise and nutrition plan for blood glucose control.;Long Term: Attainment of HbA1C < 7%.   Hypertension Yes   Intervention Provide education on lifestyle modifcations including regular physical activity/exercise, weight management, moderate sodium restriction and increased consumption of fresh fruit, vegetables, and low fat dairy, alcohol moderation, and smoking cessation.;Monitor prescription use compliance.   Expected Outcomes Short Term: Continued assessment and intervention until BP is < 140/7990mm HG in hypertensive participants. < 130/3380mm  HG in hypertensive participants with diabetes, heart failure or chronic kidney disease.;Long Term: Maintenance of blood pressure at goal levels.   Lipids Yes   Intervention Provide education and support for participant on nutrition & aerobic/resistive exercise along with prescribed medications to achieve LDL 70mg , HDL >40mg .   Expected Outcomes  Short Term: Participant states understanding of desired cholesterol values and is compliant with medications prescribed. Participant is following exercise prescription and nutrition guidelines.;Long Term: Cholesterol controlled with medications as prescribed, with individualized exercise RX and with personalized nutrition plan. Value goals: LDL < 70mg , HDL > 40 mg.   Stress Yes   Intervention Offer individual and/or small group education and counseling on adjustment to heart disease, stress management and health-related lifestyle change. Teach and support self-help strategies.;Refer participants experiencing significant psychosocial distress to appropriate mental health specialists for further evaluation and treatment. When possible, include family members and significant others in education/counseling sessions.   Expected Outcomes Short Term: Participant demonstrates changes in health-related behavior, relaxation and other stress management skills, ability to obtain effective social support, and compliance with psychotropic medications if prescribed.;Long Term: Emotional wellbeing is indicated by absence of clinically significant psychosocial distress or social isolation.      Core Components/Risk Factors/Patient Goals Review:      Goals and Risk Factor Review    Row Name 03/22/16 1158 03/28/16 1540           Core Components/Risk Factors/Patient Goals Review   Personal Goals Review Weight Management/Obesity;Heart Failure;Lipids;Tobacco Cessation;Diabetes;Hypertension;Stress;Other Weight Management/Obesity;Heart Failure;Lipids;Tobacco Cessation;Diabetes;Hypertension;Stress;Other      Review  - Provide exercise programming and education on risk factors reductions for CVD to prevent progression of disease      Expected Outcomes  - pt will attend CR for exercise and education to decrease CAD risk factors.  pt will consider decreasing or total cessation of tobacco products.          Core  Components/Risk Factors/Patient Goals at Discharge (Final Review):      Goals and Risk Factor Review - 03/28/16 1540      Core Components/Risk Factors/Patient Goals Review   Personal Goals Review Weight Management/Obesity;Heart Failure;Lipids;Tobacco Cessation;Diabetes;Hypertension;Stress;Other   Review Provide exercise programming and education on risk factors reductions for CVD to prevent progression of disease   Expected Outcomes pt will attend CR for exercise and education to decrease CAD risk factors.  pt will consider decreasing or total cessation of tobacco products.       ITP Comments:     ITP Comments    Row Name 03/07/16 [id number]           ITP Comments Medical Director:  Dr. Armanda Magic           Comments: Pt is making expected progress toward personal goals after completing 8 sessions. Recommend continued exercise and life style modification education including  stress management and relaxation techniques to decrease cardiac risk profile.

## 2016-03-29 ENCOUNTER — Encounter (HOSPITAL_COMMUNITY): Payer: BLUE CROSS/BLUE SHIELD

## 2016-03-29 ENCOUNTER — Encounter (HOSPITAL_COMMUNITY)
Admission: RE | Admit: 2016-03-29 | Discharge: 2016-03-29 | Disposition: A | Discharge status: Home / Self Care | Payer: BLUE CROSS/BLUE SHIELD | Source: Ambulatory Visit | Attending: Cardiovascular Disease | Admitting: Cardiovascular Disease

## 2016-03-29 DIAGNOSIS — I2510 Atherosclerotic heart disease of native coronary artery without angina pectoris: Secondary | ICD-10-CM | POA: Diagnosis not present

## 2016-03-29 DIAGNOSIS — Z955 Presence of coronary angioplasty implant and graft: Secondary | ICD-10-CM

## 2016-03-29 LAB — GLUCOSE, CAPILLARY: GLUCOSE-CAPILLARY: 179 mg/dL — AB (ref 65–99)

## 2016-03-31 ENCOUNTER — Encounter (HOSPITAL_COMMUNITY): Payer: BLUE CROSS/BLUE SHIELD

## 2016-03-31 ENCOUNTER — Encounter (HOSPITAL_COMMUNITY)
Admission: RE | Admit: 2016-03-31 | Discharge: 2016-03-31 | Disposition: A | Discharge status: Home / Self Care | Payer: BLUE CROSS/BLUE SHIELD | Source: Ambulatory Visit | Attending: Cardiovascular Disease | Admitting: Cardiovascular Disease

## 2016-03-31 DIAGNOSIS — I2510 Atherosclerotic heart disease of native coronary artery without angina pectoris: Secondary | ICD-10-CM | POA: Diagnosis not present

## 2016-03-31 DIAGNOSIS — Z955 Presence of coronary angioplasty implant and graft: Secondary | ICD-10-CM

## 2016-03-31 LAB — GLUCOSE, CAPILLARY
Glucose-Capillary: 208 mg/dL — ABNORMAL HIGH (ref 65–99)
Glucose-Capillary: 136 mg/dL — ABNORMAL HIGH (ref 65–99)

## 2016-04-03 ENCOUNTER — Encounter (HOSPITAL_COMMUNITY): Payer: BLUE CROSS/BLUE SHIELD

## 2016-04-03 ENCOUNTER — Encounter (HOSPITAL_COMMUNITY)
Admission: RE | Admit: 2016-04-03 | Discharge: 2016-04-03 | Disposition: A | Discharge status: Home / Self Care | Payer: BLUE CROSS/BLUE SHIELD | Source: Ambulatory Visit | Attending: Cardiology | Admitting: Cardiology

## 2016-04-03 ENCOUNTER — Telehealth (HOSPITAL_COMMUNITY): Payer: Self-pay | Admitting: Cardiac Rehabilitation

## 2016-04-03 DIAGNOSIS — Z955 Presence of coronary angioplasty implant and graft: Secondary | ICD-10-CM

## 2016-04-03 DIAGNOSIS — I2510 Atherosclerotic heart disease of native coronary artery without angina pectoris: Secondary | ICD-10-CM | POA: Diagnosis not present

## 2016-04-03 LAB — GLUCOSE, CAPILLARY: GLUCOSE-CAPILLARY: 109 mg/dL — AB (ref 65–99)

## 2016-04-03 NOTE — Telephone Encounter (Signed)
pc to pt PCP Dr. Sharl Ma to inform of reported hyperglycemic episode this weekend. Pt reports his CBG-541 on Saturday.  CBG-133 at rehab today.  LM on Dr. Daune Perch nurse line.

## 2016-04-03 NOTE — Progress Notes (Signed)
Pt arrived at cardiac rehab reporting "it has been a bad weekend."  Pt reports home CBG-541 on Saturday.  He worked night shift Friday night, moved his residence on Friday and Saturday, only getting a 3 hours total sleep in over 24 hour period.  Pt took zaroxolyn as directed on Saturday. Pt CBG-109 at cardiac rehab meter, 133 pt home meter.  Pt reports he has eaten 4 teddy graham crackers with peanut butter this am.  Pt BP:  84/52 standing.  Recheck BP:  87/50, HR-67 lying, 82/51, HR-62, sitting, 90/52, HR-66 standing. Pt weight 98.2kg today, down from  99.2 03/31/2016.  Recheck BP:  89/52, Hr- 62 lying, 89/49, HR-63 sitting, 88/50, HR-65 standing,  Pt walked around track BP:  87/48, HR-71.  Pt asymptomatic.     Pt asymptomatic, however reports some dizziness with blurred vision yesterday. Pt c/o generalized fatigue with leg fatigue/claudication symptoms today.  Pt given gatorade, peanut butter crackers.  Heather, Dr. Alford Highland nurse made aware.  Per Herbert Seta, Pt instructed to hold Lasix today and tomorrow unless s/s CHF occur.  Pt does not have protein sources in his home currently since he is moving. Pt given 4 cans water packed tuna.  Pt given instructions to present to ED if symptoms occur.

## 2016-04-05 ENCOUNTER — Ambulatory Visit (HOSPITAL_COMMUNITY)
Admission: RE | Admit: 2016-04-05 | Discharge: 2016-04-05 | Disposition: A | Discharge status: Home / Self Care | Payer: BLUE CROSS/BLUE SHIELD | Source: Ambulatory Visit

## 2016-04-05 ENCOUNTER — Inpatient Hospital Stay (HOSPITAL_COMMUNITY)
Admission: EM | Admit: 2016-04-05 | Discharge: 2016-04-10 | DRG: 292 | Disposition: A | Discharge status: Home Health | Payer: BLUE CROSS/BLUE SHIELD | Attending: Internal Medicine | Admitting: Internal Medicine

## 2016-04-05 ENCOUNTER — Encounter (HOSPITAL_COMMUNITY): Payer: Self-pay | Admitting: *Deleted

## 2016-04-05 ENCOUNTER — Encounter (HOSPITAL_COMMUNITY): Payer: BLUE CROSS/BLUE SHIELD

## 2016-04-05 ENCOUNTER — Emergency Department (HOSPITAL_COMMUNITY): Payer: BLUE CROSS/BLUE SHIELD

## 2016-04-05 ENCOUNTER — Telehealth: Payer: Self-pay

## 2016-04-05 ENCOUNTER — Encounter (HOSPITAL_COMMUNITY)
Admission: RE | Admit: 2016-04-05 | Discharge: 2016-04-05 | Disposition: A | Discharge status: Home / Self Care | Payer: BLUE CROSS/BLUE SHIELD | Source: Ambulatory Visit | Attending: Cardiovascular Disease | Admitting: Cardiovascular Disease

## 2016-04-05 ENCOUNTER — Other Ambulatory Visit: Payer: Self-pay

## 2016-04-05 ENCOUNTER — Other Ambulatory Visit (HOSPITAL_COMMUNITY): Payer: BLUE CROSS/BLUE SHIELD

## 2016-04-05 DIAGNOSIS — I5023 Acute on chronic systolic (congestive) heart failure: Secondary | ICD-10-CM

## 2016-04-05 DIAGNOSIS — Z9689 Presence of other specified functional implants: Secondary | ICD-10-CM

## 2016-04-05 DIAGNOSIS — Z833 Family history of diabetes mellitus: Secondary | ICD-10-CM | POA: Diagnosis not present

## 2016-04-05 DIAGNOSIS — I454 Nonspecific intraventricular block: Secondary | ICD-10-CM | POA: Diagnosis present

## 2016-04-05 DIAGNOSIS — F17200 Nicotine dependence, unspecified, uncomplicated: Secondary | ICD-10-CM | POA: Diagnosis present

## 2016-04-05 DIAGNOSIS — F17210 Nicotine dependence, cigarettes, uncomplicated: Secondary | ICD-10-CM | POA: Diagnosis present

## 2016-04-05 DIAGNOSIS — I255 Ischemic cardiomyopathy: Secondary | ICD-10-CM

## 2016-04-05 DIAGNOSIS — Z7982 Long term (current) use of aspirin: Secondary | ICD-10-CM | POA: Diagnosis not present

## 2016-04-05 DIAGNOSIS — I2510 Atherosclerotic heart disease of native coronary artery without angina pectoris: Secondary | ICD-10-CM | POA: Diagnosis present

## 2016-04-05 DIAGNOSIS — E876 Hypokalemia: Secondary | ICD-10-CM | POA: Diagnosis present

## 2016-04-05 DIAGNOSIS — I959 Hypotension, unspecified: Secondary | ICD-10-CM | POA: Diagnosis present

## 2016-04-05 DIAGNOSIS — I5022 Chronic systolic (congestive) heart failure: Secondary | ICD-10-CM | POA: Diagnosis present

## 2016-04-05 DIAGNOSIS — Z95810 Presence of automatic (implantable) cardiac defibrillator: Secondary | ICD-10-CM | POA: Diagnosis not present

## 2016-04-05 DIAGNOSIS — Z7902 Long term (current) use of antithrombotics/antiplatelets: Secondary | ICD-10-CM | POA: Diagnosis not present

## 2016-04-05 DIAGNOSIS — I6529 Occlusion and stenosis of unspecified carotid artery: Secondary | ICD-10-CM | POA: Diagnosis present

## 2016-04-05 DIAGNOSIS — I9589 Other hypotension: Secondary | ICD-10-CM | POA: Diagnosis present

## 2016-04-05 DIAGNOSIS — Z419 Encounter for procedure for purposes other than remedying health state, unspecified: Secondary | ICD-10-CM

## 2016-04-05 DIAGNOSIS — I679 Cerebrovascular disease, unspecified: Secondary | ICD-10-CM | POA: Diagnosis present

## 2016-04-05 DIAGNOSIS — I739 Peripheral vascular disease, unspecified: Secondary | ICD-10-CM | POA: Diagnosis present

## 2016-04-05 DIAGNOSIS — R570 Cardiogenic shock: Secondary | ICD-10-CM | POA: Diagnosis not present

## 2016-04-05 DIAGNOSIS — Z87442 Personal history of urinary calculi: Secondary | ICD-10-CM | POA: Diagnosis not present

## 2016-04-05 DIAGNOSIS — Z8249 Family history of ischemic heart disease and other diseases of the circulatory system: Secondary | ICD-10-CM | POA: Diagnosis not present

## 2016-04-05 DIAGNOSIS — I110 Hypertensive heart disease with heart failure: Secondary | ICD-10-CM | POA: Diagnosis present

## 2016-04-05 DIAGNOSIS — I509 Heart failure, unspecified: Secondary | ICD-10-CM | POA: Diagnosis not present

## 2016-04-05 DIAGNOSIS — I10 Essential (primary) hypertension: Secondary | ICD-10-CM | POA: Diagnosis present

## 2016-04-05 DIAGNOSIS — Z79899 Other long term (current) drug therapy: Secondary | ICD-10-CM | POA: Diagnosis not present

## 2016-04-05 DIAGNOSIS — Z95811 Presence of heart assist device: Secondary | ICD-10-CM

## 2016-04-05 DIAGNOSIS — E1151 Type 2 diabetes mellitus with diabetic peripheral angiopathy without gangrene: Secondary | ICD-10-CM | POA: Diagnosis present

## 2016-04-05 DIAGNOSIS — J90 Pleural effusion, not elsewhere classified: Secondary | ICD-10-CM | POA: Diagnosis present

## 2016-04-05 DIAGNOSIS — E785 Hyperlipidemia, unspecified: Secondary | ICD-10-CM | POA: Diagnosis present

## 2016-04-05 DIAGNOSIS — N179 Acute kidney failure, unspecified: Secondary | ICD-10-CM | POA: Diagnosis not present

## 2016-04-05 DIAGNOSIS — E871 Hypo-osmolality and hyponatremia: Secondary | ICD-10-CM | POA: Diagnosis present

## 2016-04-05 DIAGNOSIS — I252 Old myocardial infarction: Secondary | ICD-10-CM | POA: Diagnosis not present

## 2016-04-05 DIAGNOSIS — Z794 Long term (current) use of insulin: Secondary | ICD-10-CM | POA: Diagnosis not present

## 2016-04-05 DIAGNOSIS — E118 Type 2 diabetes mellitus with unspecified complications: Secondary | ICD-10-CM | POA: Diagnosis present

## 2016-04-05 DIAGNOSIS — K2270 Barrett's esophagus without dysplasia: Secondary | ICD-10-CM | POA: Diagnosis present

## 2016-04-05 LAB — GLUCOSE, CAPILLARY
GLUCOSE-CAPILLARY: 173 mg/dL — AB (ref 65–99)
GLUCOSE-CAPILLARY: 275 mg/dL — AB (ref 65–99)
Glucose-Capillary: 382 mg/dL — ABNORMAL HIGH (ref 65–99)

## 2016-04-05 LAB — CBG MONITORING, ED: Glucose-Capillary: 179 mg/dL — ABNORMAL HIGH (ref 65–99)

## 2016-04-05 LAB — MRSA PCR SCREENING: MRSA BY PCR: NEGATIVE

## 2016-04-05 LAB — BRAIN NATRIURETIC PEPTIDE: B NATRIURETIC PEPTIDE 5: 444.6 pg/mL — AB (ref 0.0–100.0)

## 2016-04-05 LAB — COOXEMETRY PANEL
Carboxyhemoglobin: 3.8 % — ABNORMAL HIGH (ref 0.5–1.5)
Methemoglobin: 0.9 % (ref 0.0–1.5)
O2 SAT: 63.2 %
Total hemoglobin: 12 g/dL (ref 12.0–16.0)

## 2016-04-05 LAB — CBC
HCT: 34 % — ABNORMAL LOW (ref 39.0–52.0)
HEMATOCRIT: 35.2 % — AB (ref 39.0–52.0)
HEMOGLOBIN: 11.8 g/dL — AB (ref 13.0–17.0)
HEMOGLOBIN: 11.6 g/dL — AB (ref 13.0–17.0)
MCH: 28.8 pg (ref 26.0–34.0)
MCH: 29.4 pg (ref 26.0–34.0)
MCHC: 33.5 g/dL (ref 30.0–36.0)
MCHC: 34.1 g/dL (ref 30.0–36.0)
MCV: 85.9 fL (ref 78.0–100.0)
MCV: 86.1 fL (ref 78.0–100.0)
PLATELETS: 118 10*3/uL — AB (ref 150–400)
Platelets: 136 10*3/uL — ABNORMAL LOW (ref 150–400)
RBC: 4.1 MIL/uL — AB (ref 4.22–5.81)
RBC: 3.95 MIL/uL — ABNORMAL LOW (ref 4.22–5.81)
RDW: 17.6 % — ABNORMAL HIGH (ref 11.5–15.5)
RDW: 17.6 % — ABNORMAL HIGH (ref 11.5–15.5)
WBC: 8 10*3/uL (ref 4.0–10.5)
WBC: 7 10*3/uL (ref 4.0–10.5)

## 2016-04-05 LAB — BASIC METABOLIC PANEL
ANION GAP: 14 (ref 5–15)
BUN: 60 mg/dL — ABNORMAL HIGH (ref 6–20)
CHLORIDE: 84 mmol/L — AB (ref 101–111)
CO2: 30 mmol/L (ref 22–32)
Calcium: 8.8 mg/dL — ABNORMAL LOW (ref 8.9–10.3)
Creatinine, Ser: 1.93 mg/dL — ABNORMAL HIGH (ref 0.61–1.24)
GFR calc non Af Amer: 36 mL/min — ABNORMAL LOW (ref >60)
GFR, EST AFRICAN AMERICAN: 41 mL/min — AB (ref >60)
GLUCOSE: 191 mg/dL — AB (ref 65–99)
Potassium: 3 mmol/L — ABNORMAL LOW (ref 3.5–5.1)
Sodium: 128 mmol/L — ABNORMAL LOW (ref 135–145)

## 2016-04-05 LAB — DIGOXIN LEVEL
DIGOXIN LVL: 1 ng/mL (ref 0.8–2.0)
Digoxin Level: 0.9 ng/mL (ref 0.8–2.0)

## 2016-04-05 LAB — CREATININE, SERUM
CREATININE: 1.56 mg/dL — AB (ref 0.61–1.24)
GFR calc Af Amer: 54 mL/min — ABNORMAL LOW (ref >60)
GFR, EST NON AFRICAN AMERICAN: 46 mL/min — AB (ref >60)

## 2016-04-05 LAB — TROPONIN I: TROPONIN I: 0.06 ng/mL — AB (ref <0.03)

## 2016-04-05 LAB — I-STAT CG4 LACTIC ACID, ED: LACTIC ACID, VENOUS: 1.61 mmol/L (ref 0.5–1.9)

## 2016-04-05 MED ORDER — ONDANSETRON HCL 4 MG/2ML IJ SOLN: 4.0000 mg | Freq: Four times a day (QID) | INTRAMUSCULAR | Status: DC | PRN

## 2016-04-05 MED ORDER — ASPIRIN EC 81 MG PO TBEC
81.0000 mg | DELAYED_RELEASE_TABLET | Freq: Every day | ORAL | Status: DC
Start: 2016-04-06 — End: 2016-04-10
  Administered 2016-04-06 – 2016-04-10 (×5): 81 mg via ORAL
  Filled 2016-04-05 (×5): qty 1

## 2016-04-05 MED ORDER — SODIUM CHLORIDE 0.9% FLUSH: 3.0000 mL | INTRAVENOUS | Status: DC | PRN

## 2016-04-05 MED ORDER — CHLORHEXIDINE GLUCONATE CLOTH 2 % EX PADS
6.0000 | MEDICATED_PAD | Freq: Every day | CUTANEOUS | Status: DC
  Administered 2016-04-06 – 2016-04-10 (×5): 6 via TOPICAL

## 2016-04-05 MED ORDER — SODIUM CHLORIDE 0.9% FLUSH: 10.0000 mL | INTRAVENOUS | Status: DC | PRN

## 2016-04-05 MED ORDER — CLOPIDOGREL BISULFATE 75 MG PO TABS: 75.0000 mg | ORAL_TABLET | Freq: Every day | ORAL | Status: DC

## 2016-04-05 MED ORDER — DIGOXIN 125 MCG PO TABS
0.1250 mg | ORAL_TABLET | Freq: Every day | ORAL | Status: DC
  Administered 2016-04-06 – 2016-04-10 (×5): 0.125 mg via ORAL
  Filled 2016-04-05 (×5): qty 1

## 2016-04-05 MED ORDER — ATORVASTATIN CALCIUM 80 MG PO TABS
80.0000 mg | ORAL_TABLET | ORAL | Status: DC
  Administered 2016-04-07 – 2016-04-10 (×3): 80 mg via ORAL
  Filled 2016-04-05 (×3): qty 1

## 2016-04-05 MED ORDER — INSULIN ASPART 100 UNIT/ML ~~LOC~~ SOLN
0.0000 [IU] | Freq: Three times a day (TID) | SUBCUTANEOUS | Status: DC
  Administered 2016-04-06: 8 [IU] via SUBCUTANEOUS
  Administered 2016-04-06: 5 [IU] via SUBCUTANEOUS
  Administered 2016-04-07 (×2): 3 [IU] via SUBCUTANEOUS
  Administered 2016-04-08: 15 [IU] via SUBCUTANEOUS
  Administered 2016-04-08: 11 [IU] via SUBCUTANEOUS
  Administered 2016-04-08: 5 [IU] via SUBCUTANEOUS
  Administered 2016-04-09: 8 [IU] via SUBCUTANEOUS
  Administered 2016-04-09: 15 [IU] via SUBCUTANEOUS
  Administered 2016-04-09: 8 [IU] via SUBCUTANEOUS
  Administered 2016-04-10: 11 [IU] via SUBCUTANEOUS
  Administered 2016-04-10: 15 [IU] via SUBCUTANEOUS
  Administered 2016-04-10: 3 [IU] via SUBCUTANEOUS

## 2016-04-05 MED ORDER — HEPARIN SODIUM (PORCINE) 5000 UNIT/ML IJ SOLN
5000.0000 [IU] | Freq: Three times a day (TID) | INTRAMUSCULAR | Status: AC
  Administered 2016-04-05 – 2016-04-06 (×4): 5000 [IU] via SUBCUTANEOUS
  Filled 2016-04-05 (×4): qty 1

## 2016-04-05 MED ORDER — POTASSIUM CHLORIDE CRYS ER 20 MEQ PO TBCR: 20.0000 meq | EXTENDED_RELEASE_TABLET | ORAL | Status: DC

## 2016-04-05 MED ORDER — POTASSIUM CHLORIDE CRYS ER 20 MEQ PO TBCR
40.0000 meq | EXTENDED_RELEASE_TABLET | Freq: Once | ORAL | Status: AC
  Administered 2016-04-05: 40 meq via ORAL
  Filled 2016-04-05: qty 2

## 2016-04-05 MED ORDER — SODIUM CHLORIDE 0.9% FLUSH
10.0000 mL | Freq: Two times a day (BID) | INTRAVENOUS | Status: DC
  Administered 2016-04-05: 10 mL
  Administered 2016-04-06: 30 mL
  Administered 2016-04-06 – 2016-04-07 (×2): 10 mL
  Administered 2016-04-07: 20 mL
  Administered 2016-04-08 – 2016-04-10 (×3): 10 mL

## 2016-04-05 MED ORDER — ACETAMINOPHEN 325 MG PO TABS: 650.0000 mg | ORAL_TABLET | ORAL | Status: DC | PRN

## 2016-04-05 MED ORDER — INSULIN ASPART 100 UNIT/ML ~~LOC~~ SOLN
0.0000 [IU] | Freq: Every day | SUBCUTANEOUS | Status: DC
  Administered 2016-04-05: 5 [IU] via SUBCUTANEOUS
  Administered 2016-04-06 – 2016-04-07 (×2): 2 [IU] via SUBCUTANEOUS
  Administered 2016-04-08 – 2016-04-09 (×2): 3 [IU] via SUBCUTANEOUS

## 2016-04-05 MED ORDER — EZETIMIBE 10 MG PO TABS
10.0000 mg | ORAL_TABLET | Freq: Every day | ORAL | Status: DC
  Administered 2016-04-06 – 2016-04-10 (×5): 10 mg via ORAL
  Filled 2016-04-05 (×5): qty 1

## 2016-04-05 MED ORDER — SODIUM CHLORIDE 0.9% FLUSH
3.0000 mL | Freq: Two times a day (BID) | INTRAVENOUS | Status: DC
  Administered 2016-04-05 – 2016-04-10 (×10): 3 mL via INTRAVENOUS

## 2016-04-05 MED ORDER — SODIUM CHLORIDE 0.9 % IV SOLN
250.0000 mL | INTRAVENOUS | Status: DC | PRN
  Administered 2016-04-06 – 2016-04-10 (×2): 250 mL via INTRAVENOUS

## 2016-04-05 NOTE — ED Notes (Signed)
Patient transported to X-ray 

## 2016-04-05 NOTE — ED Notes (Signed)
Pt dinner tray ordered.

## 2016-04-05 NOTE — ED Triage Notes (Signed)
Pt brought here from cardiac rehab. Pt reported generalized fatigue and weakness for several days. Pt is hypotensive. Denies any pain or sob.

## 2016-04-05 NOTE — ED Notes (Signed)
MD aware of pt blood pressure

## 2016-04-05 NOTE — ED Notes (Signed)
Pt given applesauce and ginger ale per Courtney(RN)

## 2016-04-05 NOTE — Progress Notes (Signed)
Peripherally Inserted Central Catheter/Midline Placement  The IV Nurse has discussed with the patient and/or persons authorized to consent for the patient, the purpose of this procedure and the potential benefits and risks involved with this procedure.  The benefits include less needle sticks, lab draws from the catheter, and the patient may be discharged home with the catheter. Risks include, but not limited to, infection, bleeding, blood clot (thrombus formation), and puncture of an artery; nerve damage and irregular heartbeat and possibility to perform a PICC exchange if needed/ordered by physician.  Alternatives to this procedure were also discussed.  Bard Power PICC patient education guide, fact sheet on infection prevention and patient information card has been provided to patient /or left at bedside.    PICC/Midline Placement Documentation        Romie Jumper 04/05/2016, 7:14 PM

## 2016-04-05 NOTE — ED Notes (Signed)
Attempted report x 1 charge nurse states she will call me back

## 2016-04-05 NOTE — ED Notes (Signed)
Date and time results received: 04/05/16 11:58 AM   Test: Troponin  Critical Value: 0.06  Name of Provider Notified: Pheiffer MD  Orders Received. MD at bedside

## 2016-04-05 NOTE — H&P (Addendum)
H&P NOTE   Patient ID: [name] MRN: [id number] DOB/AGE: 62-Aug-1957 62 y.o.  Admit date: 04/05/2016   Primary Physician:   [name], MD Primary Cardiologist:  Dr. Shirlee Latch / Dr. Graciela Husbands (EP) Reason for Consultation:  Weakness / hypotension   HPI: [name] is a 61 y.o. male with a history of PAD s/p left fem-pop bypass with graft in 2017, carotid artery stenosis, DMT2, HLD, HTN, CAD, tobacco abuse, ischemic cardiomyopathy s/p St Jude ICD and RV failure who presented to cardiac rehab today complaining of weakness and found to be hypotensive. He was sent to the ER and cardiology asked to come admit.   Patient was admitted in 1/18 with PNA and acute on chronic systolic CHF.  He was markedly volume overloaded and also had low cardiac output requiring dobutamine use.  He had a right pleural effusion and underwent thoracentesis.  Fluid studies suggested a parapneumonic effusion.  He had cardiac cath, showing 3 vessel disease and markedly elevated filling pressures, right and left.  He was evaluated for CABG but turned down given comorbidities and lack of venous conduits.  We were able to wean him off dobutamine and discharged him with plan for outpatient PCI once he had recovered from the current hospitalization.   Admitted 01/28/16 - 02/03/16 after presenting for elective 2 vessel PCI of the LAD and OM of the circumflex. RHC at same time showed advanced HF with low cardiac index and severely elevated pressures. Diuresed with IV lasix, metolazone, and milrinone, but refused PICC line to follow coox/CVP.  Underwent thoracentesis 02/01/16 with 1.8 L out. Overall diuresed 16 lbs. Discharge weight 214 lbs.  Last seen by Dr. Shirlee Latch on 03/10/16 and weight 220 lbs. He was felt to be volume overloaded. Corevue also suggested recent volume overload. He was continued on torsemide 80 mg bid metolazone 2.5 mg once a week on Saturdays was added. There was concern for his overall trajectory with  multiple admissions requiring inotrope support and it was felt he may be approaching need for advanced support.  He was in his usual state of health until this past week. Apparently he has been working non stop since last Friday working the night shift and helping his mother move. He was seen at cardiac rehab on Monday and BP noted to be low. He was told to hold his Torsemide for 2 days. He did this and gained about 10 lbs (weight 215--> 224lbs). He worked pretty hard last night and maybe "pushed it" just a little too hard. He came to cardiac rehab today and just felt lousy with weakness. No chest pain. SOB has been stable. He does have some worsening LE edema. No PND or orthopnea. No dizziness or syncope. He is currently feeling much better now than he did this AM and wanting to go home. After some discussion with Dr. Gala Romney, he is willing to stay in the hospital.    PMH: 1. PAD: Left fem-pop bypass with graft in 2017.  2. CAD: Anterior MI 2005 => PCI to LAD followed by staged PCI to LCx and RCA.   - LHC (1/18): Diffuse up to 80% calcified stenosis throughout the proximal LAD.  Diffuse disease in distal LAD up to 50%.  Large OM1 totally occluded at the ostium but reconstituted via collaterals.  80% distal RCA stenosis involving the ostia of the PLV and PDA with about 70% stenosis. Patient had PCI with DES to proximal LAD and PTCA OM1 (unable to  place stent).  3. Chronic systolic CHF: Ischemic cardiomyopathy.  St Jude ICD.  - Echo (1/18) with EF 20%, mildly decreased RV systolic function.  - RHC (1/18) with mean RA 21, PA 69/31 mean 45, mean PCWP 27, CI 2.37, PVR 3.3 WU.  - Repeat RHC (1/18) with mean RA 16, mean PCWP 21, CI 1.92 4. Carotid stenosis: Carotid dopplers (8/16) with 60-79% RICA, 40-59% LICA.   - Carotid dopplers (2/18) with 40-59% RICA stenosis.  5. Type II diabetes. 6. Hyperlipidemia. 7. HTN 8. Active smoker.   Past Medical History:  Diagnosis Date  . AICD (automatic  cardioverter/defibrillator) present   . Barretts syndrome   . Carotid artery disease (HCC)    a. Dopp 09/2011: 60-79% RICA, 40-59% LICA.;  b.  Carotid US (7/14):  Bilateral 60-79% => f/u 6 mos  . Cerebrovascular disease   . Chronic systolic heart failure (HCC)   . Coronary artery disease    a. AWMI requiring IABP 2005 s/p Horizon study stent-LAD, staged BMS-Cx, DESx2-RCA. b. Last Last LHC (6/06):  EF 25%, pLAD stent 40-50, after stent 40, dLAD 20, pCFX 20, pOM1 70, pRCA 20, mRCA stents ok.=> med Rx;  c.  Myoview (7/14):  EF 26%, large ant, septal, inf and apical infarct, no ischemia.  Med Rx continued  . Diabetes mellitus   . Heart murmur   . History of kidney stones   . HTN (hypertension)   . Hyperlipidemia    mixed  . ICD (implantable cardiac defibrillator), dual, in situ    St. Jude for severe LVD EF 25% 2/07 explanted 2010. Medtronic Virtuoso II DR Dual-chamber cardiverter-defibrillator  with pocket revision, Dr. Graciela Husbands  . Ischemic cardiomyopathy    a. Echo (09/27/12): EF 20%, diffuse HK with periapical AK, no LV thrombus noted, restrictive physiology, trivial MR, mild LAE, RVSF mildly reduced, PASP 39  . Pleural effusion 01/05/2016  . Presence of permanent cardiac pacemaker   . PVD (peripheral vascular disease) (HCC)    a. ABI 09/2011 - R normal, L moderate - saw Dr. Kirke Corin - med rx.   Marland Kitchen RIATA ICD Lead--on advisory recall    . Tobacco abuse      Past Surgical History:  Procedure Laterality Date  . ABDOMINAL AORTAGRAM N/A 11/06/2013   Procedure: ABDOMINAL Ronny Flurry;  Surgeon: Chuck Hint, MD;  Location: [facility] CATH LAB;  Service: Cardiovascular;  Laterality: N/A;  . APPENDECTOMY  2000   ruptured  . CARDIAC CATHETERIZATION N/A 01/07/2016   Procedure: Right/Left Heart Cath and Coronary Angiography;  Surgeon: Laurey Morale, MD;  Location: [facility] INVASIVE CV LAB;  Service: Cardiovascular;  Laterality: N/A;  . CARDIAC CATHETERIZATION N/A 01/28/2016   Procedure: Coronary Stent  Intervention;  Surgeon: Tonny Bollman, MD;  Location: [facility] INVASIVE CV LAB;  Service: Cardiovascular;  Laterality: N/A;  . CARDIAC CATHETERIZATION N/A 01/28/2016   Procedure: Right Heart Cath;  Surgeon: Tonny Bollman, MD;  Location: [facility] INVASIVE CV LAB;  Service: Cardiovascular;  Laterality: N/A;  . CARDIAC CATHETERIZATION N/A 01/28/2016   Procedure: Intravascular Ultrasound/IVUS;  Surgeon: Tonny Bollman, MD;  Location: [facility] INVASIVE CV LAB;  Service: Cardiovascular;  Laterality: N/A;  . FEMORAL-POPLITEAL BYPASS GRAFT Right 11/07/2013   Procedure: Right FEMORAL-POPLITEAL ARTERY Bypass ;  Surgeon: Chuck Hint, MD;  Location: [facility] OR;  Service: Vascular;  Laterality: Right;  . FEMORAL-POPLITEAL BYPASS GRAFT Left 11/17/2014   Procedure: BYPASS GRAFT FEMORAL-POPLITEAL ARTERY USING GORE PROPATEN X 80CM VASCULAR GRAFT;  Surgeon: Chuck Hint, MD;  Location: [facility]  OR;  Service: Vascular;  Laterality: Left;  . IMPLANTABLE CARDIOVERTER DEFIBRILLATOR (ICD) GENERATOR CHANGE N/A 12/16/2012   Procedure: ICD GENERATOR CHANGE;  Surgeon: [name], MD;  Location: [facility] CATH LAB;  Service: Cardiovascular;  Laterality: N/A;  . INTRAOPERATIVE ARTERIOGRAM Right 11/07/2013   Procedure: INTRA OPERATIVE ARTERIOGRAM;  Surgeon: Chuck Hint, MD;  Location: [facility] OR;  Service: Vascular;  Laterality: Right;  . LOWER EXTREMITY ANGIOGRAM Bilateral 11/06/2013   Procedure: LOWER EXTREMITY ANGIOGRAM;  Surgeon: Chuck Hint, MD;  Location: [facility] CATH LAB;  Service: Cardiovascular;  Laterality: Bilateral;  . Medtronic Virtuoso II DR dual-chamber cardioverter-defibrillation with pocket revison     Dr. Sherryl Manges  . PERIPHERAL VASCULAR CATHETERIZATION N/A 09/25/2014   Procedure: Abdominal Aortogram;  Surgeon: Sherren Kerns, MD;  Location: [facility] INVASIVE CV LAB;  Service: Cardiovascular;  Laterality: N/A;  . US GUIDED THORACENTESIS RIGHT ([facility] HX) Right 01/05/2016    Allergies  Allergen Reactions  .  Niacin Itching and Other (See Comments)    Flushing   . Metformin And Related Nausea And Vomiting    *Only the extended release*    I have reviewed the patient's current medications     Prior to Admission medications   Medication Sig Start Date End Date Taking? Authorizing Provider  aspirin 81 MG tablet Take 1 tablet (81 mg total) by mouth daily. 10/07/12   [name], MD  atorvastatin (LIPITOR) 80 MG tablet Take 1 tablet (80 mg total) by mouth every other day. 01/18/16   Laurey Morale, MD  carvedilol (COREG) 6.25 MG tablet Take 3.125 mg by mouth 2 (two) times daily with a meal.    Historical Provider, MD  clopidogrel (PLAVIX) 75 MG tablet Take 1 tablet (75 mg total) by mouth daily. 01/18/16   Laurey Morale, MD  digoxin (LANOXIN) 0.125 MG tablet Take 1 tablet (0.125 mg total) by mouth daily. 02/18/16   Dolores Patty, MD  ezetimibe (ZETIA) 10 MG tablet TAKE ONE TABLET BY MOUTH ONCE DAILY 04/26/15   Lewayne Bunting, MD  insulin NPH-regular Human (NOVOLIN 70/30) (70-30) 100 UNIT/ML injection Inject 16-32 Units into the skin 2 (two) times daily with a meal. 32 units in the morning, 16 units in the evening    Historical Provider, MD  losartan (COZAAR) 25 MG tablet Take 25 mg by mouth daily.    Historical Provider, MD  metolazone (ZAROXOLYN) 2.5 MG tablet Take 2.5 mg (1 tablet) once weekly on Saturday mornings. 03/10/16   Laurey Morale, MD  potassium chloride SA (K-DUR,KLOR-CON) 20 MEQ tablet Take 1 tablet (20 mEq total) by mouth every other day. Take extra 2 meq (1 tablet) on Saturdays with metolazone. 03/10/16   Laurey Morale, MD  spironolactone (ALDACTONE) 25 MG tablet Take 1 tablet (25 mg total) by mouth daily. 02/04/16   Amy D Filbert Schilder, NP  torsemide (DEMADEX) 20 MG tablet Take 80 mg by mouth 2 (two) times daily.    Historical Provider, MD     Social History   Social History  . Marital status: Divorced    Spouse name: N/A  . Number of children: N/A  . Years of education: N/A    Occupational History  . 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 Smoking status: Current Every Day Smoker    Packs/day: 1.00    Years: 45.00    Types: Cigarettes  . Smokeless tobacco: Never Used  Comment: pt reports he is stress smoker  . Alcohol use No  . Drug use: No     Comment: former Cocain, Acid, Marijuana - 25 years ago  . Sexual activity: No   Other Topics Concern  . Not on file   Social History Narrative   Divorced.    Family Status  Relation Status  . Mother Alive  . Father Alive   Family History  Problem Relation Age of Onset  . Diabetes Mother   . Coronary artery disease Father   . Heart disease Father     before age 32    ROS:  Full 14 point review of systems complete and found to be negative unless listed above.  Physical Exam: Blood pressure (!) 84/57, pulse 68, temperature 98 F (36.7 C), temperature source Oral, resp. rate 11, height 6' (1.829 m), weight 224 lb 14.4 oz (102 kg), SpO2 99 %.  General: Fatigued appearing  male in no acute distress, grey and chronically ill appearing Head: Eyes PERRLA, No xanthomas.   Normocephalic and atraumatic, oropharynx without edema or exudate.  Lungs: decreased breath sounds a bases Heart: HRRR S1 S2,Heart regular rate and rhythm with S1, S2 + systolic murmur at apex. pulses are 2+ extrem.  +s3 Neck: No carotid bruits. No lymphadenopathy. ++JVD to earlobe. Abdomen: Bowel sounds present, abdomen soft and non-tender without masses or hernias noted.+distended Msk:  No spine or cva tenderness. No weakness, no joint deformities or effusions. Extremities: No clubbing or cyanosis.  1-2 + LE edema R>L  Marked chronic venous stasis shanges Neuro: Alert and oriented X 3. No focal deficits noted. Psych:  Good affect, responds appropriately Skin: Purple scaly lower extremities bilateral   Labs:   Lab Results  Component Value Date   WBC 8.0 04/05/2016   HGB 11.8 (L)  04/05/2016   HCT 35.2 (L) 04/05/2016   MCV 85.9 04/05/2016   PLT 136 (L) 04/05/2016   No results for input(s): INR in the last 72 hours.   Recent Labs Lab 04/05/16 0900  NA 128*  K 3.0*  CL 84*  CO2 30  BUN 60*  CREATININE 1.93*  CALCIUM 8.8*  GLUCOSE 191*   Magnesium  Date Value Ref Range Status  01/31/2016 1.9 1.7 - 2.4 mg/dL Final   No results for input(s): CKTOTAL, CKMB, TROPONINI in the last 72 hours. No results for input(s): TROPIPOC in the last 72 hours. Pro B Natriuretic peptide (BNP)  Date/Time Value Ref Range Status  05/28/2013 10:20 AM 1,463.0 (H) 0 - 125 pg/mL Final  12/02/2012 09:59 PM 2,717.0 (H) 0 - 125 pg/mL Final   Lab Results  Component Value Date   CHOL 103 03/10/2016   HDL 49 03/10/2016   LDLCALC 45 03/10/2016   TRIG 43 03/10/2016   No results found for: DDIMER No results found for: LIPASE, AMYLASE TSH  Date/Time Value Ref Range Status  09/26/2012 06:56 AM 2.565 0.350 - 4.500 uIU/mL Final    Comment:    Performed at [facility]   No results found for: VITAMINB12, FOLATE, FERRITIN, TIBC, IRON, RETICCTPCT  Echo: 03/24/2016 LV EF: 25% -   30% Study Conclusions - Left ventricle: The cavity size was moderately dilated. Systolic   function was severely reduced. The estimated ejection fraction   was in the range of 25% to 30%. Severe diffuse hypokinesis with   distinct regional wall motion abnormalities. There is akinesis of   the entire inferoseptal and apical septal myocardium. There is  akinesis of the apicalanterior myocardium. There is akinesis of   the midanteroseptal myocardium. There was a reduced contribution   of atrial contraction to ventricular filling, due to increased   ventricular diastolic pressure or atrial contractile dysfunction.   Doppler parameters are consistent with a reversible restrictive   pattern, indicative of decreased left ventricular diastolic   compliance and/or increased left atrial pressure (grade  3   diastolic dysfunction). Doppler parameters are consistent with   high ventricular filling pressure. - Aortic valve: Mildly to moderately calcified annulus. Trileaflet;   normal thickness, mildly calcified leaflets. There was trivial   regurgitation. - Mitral valve: Calcified annulus. Mild diffuse calcification of   the anterior leaflet and posterior leaflet, with mild involvement   of chords. There was mild regurgitation. - Left atrium: The atrium was moderately dilated. - Right ventricle: The cavity size was mildly dilated. Wall   thickness was normal. Pacer wire or catheter noted in right   ventricle. - Atrial septum: There was increased thickness of the septum,   consistent with lipomatous hypertrophy. - Pulmonic valve: There was trivial regurgitation. - Pulmonary arteries: PA peak pressure: 47 mm Hg (S). - Line: A venous catheter was visualized in the superior vena cava,   with its tip in the right atrium. No abnormal features noted. Impressions: - The right ventricular systolic pressure was increased consistent   with moderate pulmonary hypertension.    01/28/16 Panel Physicians Referring Physician Case Authorizing Physician  Tonny Bollman, MD (Primary)    Procedures   Coronary Stent Intervention  Intravascular Ultrasound/IVUS  Right Heart Cath  Conclusion  1. Successful PCI of the LAD using incentive intravascular ultrasound and a single drug-eluting stent to treat a long segment of severe in-stent restenosis 2. Successful chronic total occlusion intervention of the first OM branch of the left circumflex with extensive balloon angioplasty but inability to pass a stent, 40% residual stenosis with TIMI-3 flow. 3. Congestive heart failure with elevated left and right heart pressures and low cardiac index of 1.9  Recommendations: The patient will continue on aspirin and Plavix. I think he has been revascularized as best as possible via PCI. The patient will be admitted  to advanced heart service for further treatment of his congestive heart failure.     ECG: sinus, LBBB, PAC - personally reviewed  TELE: sinus HR 60-70s - personally reviewed  Radiology:  No results found.  ASSESSMENT AND PLAN:    Active Problems:   Hyperlipidemia   TOBACCO ABUSE   Coronary atherosclerosis   Cerebrovascular disease   Hypertension   Automatic implantable cardioverter-defibrillator in St. Jude   Carotid stenosis   Hypotension   PAD (peripheral artery disease) (HCC)   Chronic systolic CHF (congestive heart failure) (HCC)   Pleural effusion on right   Diabetes mellitus with complication (HCC)  [name] is a 61 y.o. male with a history of PAD s/p left fem-pop bypass with graft in 2017, carotid artery stenosis, DMT2, HLD, HTN, CAD, tobacco abuse, ischemic cardiomyopathy s/p St Jude ICD and RV failure who presented to cardiac rehab today complaining of weakness and found to be hypotensive. He was sent to the ER and cardiology asked to consult.   Weakness/hypotension: patient presenting with progression of low output heart failure. Will plan for admission with inotropic support and diuresis. Initially unwilling to stay, but after a discussion with Dr. Gala Romney about current poor clinical status and high likelihood of deterioration at home, he is willing to stay.  Acute  on chronic systolic CHF: Ischemic cardiomyopathy.  Echo (1/18) with EF 20%, mildly decreased RV systolic function.  Significant component of RV failure. St Jude ICD.   - Current home regimen is torsemide 80 mg bid and metolazone 2.5 mg 1x/ week on Saturdays. He has been holding Torsemide since Monday because of hypotension. Now here with weakness and hypotension. Weight 10 lbs up (215 --> 224 today) and evidence of volume overload on exam. BNP is only mildly elevated ~444. CXR with stable CHF and mildly improved pleural effusion. St Jude Corvue shows impedance is at baseline. Plan will be to continue digoxin  0.125 mg daily but HOLD losartan 25 mg daily, spironolactone 25 mg daily, coreg 3.125 mg BID.  Will plan to start IV diuresis and dobutamine once we get initial coox back.   CAD: 3 vessel CAD, s/p stent to proximal LAD and PTCA to subtotally occluded large OM1 in 1/18.  - Continue ASA 81/plavix 75 mg daily and atorvastatin 80 mg daily.    PAD: Stable claudication, notes walking up a hill.  - He follows with [facility]. No change to current plans.   Carotid stenosis: dopplers on 02/29/16 showed 40-59% RCA 1-39% ICA stenosis  R Pleural effusion: Slight interval decrease in the moderate size right pleural effusion since the previous study on CXR today  DMT2: SSI while here   Signed: Cline Crock, PA-C 04/05/2016 11:44 AM  Pager [phone]  Co-Sign MD  Patient seen and examined with Carlean Jews, PA-C. We discussed all aspects of the encounter. I agree with the assessment and plan as stated above.  He appears to have recurrent low output HF with profound fatigue, 10 pound weight gain, worsening renal function and hypotension. On exam appears ashen and a but hypotensive. No resp distress. Neck veins are up. + S3. Mild edema.   Inititially refused admission but now agrees. Will admit to ICU. Place PICC and check co-ox. Will likely need dobutamine support and IV diuresis. Hold carvedilol and other HF meds for now. Will likely need to decide on home inotropes versus Palliative Care/Hospice. Not candidate for advanced therapies.   ICD interrogated at bedside. No VT/VF. Impedance looks ok.  Arvilla Meres, MD  9:17 PM

## 2016-04-05 NOTE — Progress Notes (Signed)
Potassium 3.0 called and asked for replacement, verbal order given from Cline Crock, PA for 40 meqs now and 40 meqs two hours later.  Hermina Barters, RN

## 2016-04-05 NOTE — Progress Notes (Signed)
[name] paged regarding patient's soft blood pressures with systolic in the 80s. Also notified MD of CBG > 200 upon arrival to [facility]. MD okay with blood pressures and will put in orders for SSI coverage. Will continue to monitor patient closely.   [name]

## 2016-04-05 NOTE — ED Notes (Signed)
Report has been called, nurse will call when room is ready

## 2016-04-05 NOTE — Progress Notes (Signed)
Pt arrived at cardiac rehab c/o weakness, fatigue and leg fatigue. Pt brought into rehab via wheelchair.  BP:  85/55, HR-75 sinus occasional PAC.  Pt denies pain or dyspnea.  Pt right arm in bandage, pt reports he scraped his arm on door at home.  Old blood visible on clothing.  CBG:  155.  Pt reports he ate yogurt this am.  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  Pt given gatorade with minimal relief.  Micah Flesher, PA made aware.  Order given for 12 lead EKG.  Pt taken to ED via wheelchair for further evaluation, in stable condition.   Pt did not want emergency contact to be notified.  Pt states, " I will call them myself."

## 2016-04-05 NOTE — Telephone Encounter (Signed)
Spoke to patient regarding screening for BeAT-HF Study. Patient states he is aware that he has pleural effusion in right lung. He agrees to follow up with Dr. Shirlee Latch on April 20th to discuss chest xray/pleural effusion ordered by Dr. Shirlee Latch on  03-24-16. Informed patient that Clinical research associate spoke with Dr. Johney Frame and Dr. Myra Gianotti regarding HF study and both agree he should have pleural effusion worked up and blood sugars under control before proceeding with study. Will screen fail for current time and perhaps rescreen for study in the future. Patient very pleasant.

## 2016-04-05 NOTE — ED Provider Notes (Signed)
[facility]-EMERGENCY DEPT Provider Note   CSN: [id number] Arrival date & time: 04/05/16  0830     History   Chief Complaint Chief Complaint  Patient presents with  . Weakness    HPI [name] is a 61 y.o. male.  HPI Patient has history of significant ischemic cardiomyopathy and chronic systolic congestive heart failure as well as diabetes and peripheral vascular disease. Patient underwent elective 2 vessel PCI during his hospitalization from 1\26\2018-2\1\2018. This was complicated by CHF exacerbation requiring diuresis with IV Lasix metaxalone and milrinone. He had gone back to work this week. He works night shifts. 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 He reports he is getting a little fatigued and not feeling so well ports and of the shift but thought he was doing alright. He went on to go to cardiac rehabilitation but then got very weak and lightheaded and had a assistance to get down to the rehabilitation center. He reports once there they had him lie down and checked his pressures which were significantly hypotensive and referred him to the emergency department. Patient denies he ever got a chest pain. He reports he just felt really fatigued and washed out. He's been chronically having problems with shortness of breath due to his congestive heart failure. He reports it does get worse with exertion but he does not feel like these past several days have been progressively worsening. Now that he is lying down in the emergency department, patient is insistent that he is feeling much better and he is fine to go home despite being significantly hypotensive. He reports he is compliant with all his medications but he just feels like treating 1 condition seems to exacerbate another and he is becoming frustrated. Past Medical History:  Diagnosis Date  . AICD (automatic cardioverter/defibrillator) present   .  Barretts syndrome   . Carotid artery disease (HCC)    a. Dopp 09/2011: 60-79% RICA, 40-59% LICA.;  b.  Carotid US (7/14):  Bilateral 60-79% => f/u 6 mos  . Cerebrovascular disease   . Chronic systolic heart failure (HCC)   . Coronary artery disease    a. AWMI requiring IABP 2005 s/p Horizon study stent-LAD, staged BMS-Cx, DESx2-RCA. b. Last Last LHC (6/06):  EF 25%, pLAD stent 40-50, after stent 40, dLAD 20, pCFX 20, pOM1 70, pRCA 20, mRCA stents ok.=> med Rx;  c.  Myoview (7/14):  EF 26%, large ant, septal, inf and apical infarct, no ischemia.  Med Rx continued  . Diabetes mellitus   . Heart murmur   . History of kidney stones   . HTN (hypertension)   . Hyperlipidemia    mixed  . ICD (implantable cardiac defibrillator), dual, in situ    St. Jude for severe LVD EF 25% 2/07 explanted 2010. Medtronic Virtuoso II DR Dual-chamber cardiverter-defibrillator  with pocket revision, Dr. Graciela Husbands  . Ischemic cardiomyopathy    a. Echo (09/27/12): EF 20%, diffuse HK with periapical AK, no LV thrombus noted, restrictive physiology, trivial MR, mild LAE, RVSF mildly reduced, PASP 39  . Pleural effusion 01/05/2016  . Presence of permanent cardiac pacemaker   . PVD (peripheral vascular disease) (HCC)    a. ABI 09/2011 - R normal, L moderate - saw Dr. Kirke Corin - med rx.   Marland Kitchen RIATA ICD Lead--on advisory recall    . Tobacco abuse     Patient Active Problem List   Diagnosis  Date Noted  . Ischemic cardiomyopathy   . Pleural effusion on right   . Diabetes mellitus with complication (HCC)   . Hypoglycemia 03/13/2015  . Chronic systolic CHF (congestive heart failure) (HCC) 03/13/2015  . Pressure ulcer 12/31/2014  . Pleural effusion 12/30/2014  . Hyperglycemia without ketosis   . Infected open wound 09/22/2014  . Type 2 diabetes mellitus (HCC) 09/22/2014  . Skin ulcer (HCC) 09/21/2014  . PAD (peripheral artery disease) (HCC) 11/07/2013  . PVD (peripheral vascular disease) (HCC)   . Pre-operative cardiovascular  examination   . Metabolic alkalosis 12/07/2012  . Hypotension 10/01/2012  . Carotid stenosis 10/25/2011  . Hypertension 08/09/2010  . Automatic implantable cardioverter-defibrillator in St. Jude 08/09/2010  . Coronary atherosclerosis 01/21/2009  . TOBACCO ABUSE 05/15/2008  . Cerebrovascular disease 05/15/2008  . Peripheral vascular disease (HCC) 05/15/2008  . Hyperlipidemia 01/22/2008  . SYSTOLIC HEART FAILURE, CHRONIC 01/22/2008    Past Surgical History:  Procedure Laterality Date  . ABDOMINAL AORTAGRAM N/A 11/06/2013   Procedure: ABDOMINAL Ronny Flurry;  Surgeon: Chuck Hint, MD;  Location: [facility] CATH LAB;  Service: Cardiovascular;  Laterality: N/A;  . APPENDECTOMY  2000   ruptured  . CARDIAC CATHETERIZATION N/A 01/07/2016   Procedure: Right/Left Heart Cath and Coronary Angiography;  Surgeon: Laurey Morale, MD;  Location: [facility] INVASIVE CV LAB;  Service: Cardiovascular;  Laterality: N/A;  . CARDIAC CATHETERIZATION N/A 01/28/2016   Procedure: Coronary Stent Intervention;  Surgeon: Tonny Bollman, MD;  Location: [facility] INVASIVE CV LAB;  Service: Cardiovascular;  Laterality: N/A;  . CARDIAC CATHETERIZATION N/A 01/28/2016   Procedure: Right Heart Cath;  Surgeon: Tonny Bollman, MD;  Location: [facility] INVASIVE CV LAB;  Service: Cardiovascular;  Laterality: N/A;  . CARDIAC CATHETERIZATION N/A 01/28/2016   Procedure: Intravascular Ultrasound/IVUS;  Surgeon: Tonny Bollman, MD;  Location: [facility] INVASIVE CV LAB;  Service: Cardiovascular;  Laterality: N/A;  . FEMORAL-POPLITEAL BYPASS GRAFT Right 11/07/2013   Procedure: Right FEMORAL-POPLITEAL ARTERY Bypass ;  Surgeon: Chuck Hint, MD;  Location: [facility] OR;  Service: Vascular;  Laterality: Right;  . FEMORAL-POPLITEAL BYPASS GRAFT Left 11/17/2014   Procedure: BYPASS GRAFT FEMORAL-POPLITEAL ARTERY USING GORE PROPATEN X 80CM VASCULAR GRAFT;  Surgeon: Chuck Hint, MD;  Location: [facility] OR;  Service: Vascular;  Laterality: Left;  . IMPLANTABLE  CARDIOVERTER DEFIBRILLATOR (ICD) GENERATOR CHANGE N/A 12/16/2012   Procedure: ICD GENERATOR CHANGE;  Surgeon: [name], MD;  Location: [facility] CATH LAB;  Service: Cardiovascular;  Laterality: N/A;  . INTRAOPERATIVE ARTERIOGRAM Right 11/07/2013   Procedure: INTRA OPERATIVE ARTERIOGRAM;  Surgeon: Chuck Hint, MD;  Location: [facility] OR;  Service: Vascular;  Laterality: Right;  . LOWER EXTREMITY ANGIOGRAM Bilateral 11/06/2013   Procedure: LOWER EXTREMITY ANGIOGRAM;  Surgeon: Chuck Hint, MD;  Location: [facility] CATH LAB;  Service: Cardiovascular;  Laterality: Bilateral;  . Medtronic Virtuoso II DR dual-chamber cardioverter-defibrillation with pocket revison     Dr. Sherryl Manges  . PERIPHERAL VASCULAR CATHETERIZATION N/A 09/25/2014   Procedure: Abdominal Aortogram;  Surgeon: Sherren Kerns, MD;  Location: [facility] INVASIVE CV LAB;  Service: Cardiovascular;  Laterality: N/A;  . US GUIDED THORACENTESIS RIGHT ([facility] HX) Right 01/05/2016       Home Medications    Prior to Admission medications   Medication Sig Start Date End Date Taking? Authorizing Provider  aspirin 81 MG tablet Take 1 tablet (81 mg total) by mouth daily. 10/07/12   [name], MD  atorvastatin (LIPITOR) 80 MG tablet Take 1 tablet (80 mg total) by mouth  every other day. 01/18/16   Laurey Morale, MD  carvedilol (COREG) 6.25 MG tablet Take 3.125 mg by mouth 2 (two) times daily with a meal.    Historical Provider, MD  clopidogrel (PLAVIX) 75 MG tablet Take 1 tablet (75 mg total) by mouth daily. 01/18/16   Laurey Morale, MD  digoxin (LANOXIN) 0.125 MG tablet Take 1 tablet (0.125 mg total) by mouth daily. 02/18/16   Dolores Patty, MD  ezetimibe (ZETIA) 10 MG tablet TAKE ONE TABLET BY MOUTH ONCE DAILY 04/26/15   Lewayne Bunting, MD  insulin NPH-regular Human (NOVOLIN 70/30) (70-30) 100 UNIT/ML injection Inject 16-32 Units into the skin 2 (two) times daily with a meal. 32 units in the morning, 16 units in the evening     Historical Provider, MD  losartan (COZAAR) 25 MG tablet Take 25 mg by mouth daily.    Historical Provider, MD  metolazone (ZAROXOLYN) 2.5 MG tablet Take 2.5 mg (1 tablet) once weekly on Saturday mornings. 03/10/16   Laurey Morale, MD  potassium chloride SA (K-DUR,KLOR-CON) 20 MEQ tablet Take 1 tablet (20 mEq total) by mouth every other day. Take extra 2 meq (1 tablet) on Saturdays with metolazone. 03/10/16   Laurey Morale, MD  spironolactone (ALDACTONE) 25 MG tablet Take 1 tablet (25 mg total) by mouth daily. 02/04/16   Amy D Filbert Schilder, NP  torsemide (DEMADEX) 20 MG tablet Take 80 mg by mouth 2 (two) times daily.    Historical Provider, MD    Family History Family History  Problem Relation Age of Onset  . Diabetes Mother   . Coronary artery disease Father   . Heart disease Father     before age 56    Social History Social History  Substance Use Topics  . Smoking status: Current Every Day Smoker    Packs/day: 1.00    Years: 45.00    Types: Cigarettes  . Smokeless tobacco: Never Used     Comment: pt reports he is stress smoker  . Alcohol use No     Allergies   Niacin and Metformin and related   Review of Systems Review of Systems 10 Systems reviewed and are negative for acute change except as noted in the HPI.  Physical Exam Updated Vital Signs BP (!) 85/59   Pulse 71   Temp 98 F (36.7 C) (Oral)   Resp 11   Ht 6' (1.829 m)   Wt 224 lb 14.4 oz (102 kg)   SpO2 95%   BMI 30.50 kg/m   Physical Exam  Constitutional: He is oriented to person, place, and time.  Patient is pale and deconditioned in appearance. No respiratory distress at rest. Mental status is clear.  HENT:  Head: Normocephalic and atraumatic.  Mouth/Throat: Oropharynx is clear and moist.  Eyes: EOM are normal.  Cardiovascular: Normal rate and intact distal pulses.   2\6 systolic ejection murmur.  Pulmonary/Chest:  No significant increased work of breathing at rest. Rales left lung base and decreased  breath sounds right lung base  Abdominal: Soft. He exhibits no distension. There is no tenderness.  Abdomen is soft and moderate central obesity. Patient does have slight pitting of the lower abdomen. Abdomen is nontender.  Musculoskeletal:  Patient has severe chronic changes of the lower extremities consistent with venous stasis and/or peripheral vascular disease. Violaceous discoloration of the lower legs with chronic scaling, skin thinning and dryness. No active wounds at this time. All prior ulcers have dry bases. 2+ edema  bilaterally.  Neurological: He is alert and oriented to person, place, and time. No cranial nerve deficit. He exhibits normal muscle tone. Coordination normal.  Skin: Skin is warm and dry. There is pallor.  Psychiatric: He has a normal mood and affect.     ED Treatments / Results  Labs (all labs ordered are listed, but only abnormal results are displayed) Labs Reviewed  BASIC METABOLIC PANEL - Abnormal; Notable for the following:       Result Value   Sodium 128 (*)    Potassium 3.0 (*)    Chloride 84 (*)    Glucose, Bld 191 (*)    BUN 60 (*)    Creatinine, Ser 1.93 (*)    Calcium 8.8 (*)    GFR calc non Af Amer 36 (*)    GFR calc Af Amer 41 (*)    All other components within normal limits  CBC - Abnormal; Notable for the following:    RBC 4.10 (*)    Hemoglobin 11.8 (*)    HCT 35.2 (*)    RDW 17.6 (*)    Platelets 136 (*)    All other components within normal limits  TROPONIN I - Abnormal; Notable for the following:    Troponin I 0.06 (*)    All other components within normal limits  BRAIN NATRIURETIC PEPTIDE - Abnormal; Notable for the following:    B Natriuretic Peptide 444.6 (*)    All other components within normal limits  CBG MONITORING, ED - Abnormal; Notable for the following:    Glucose-Capillary 179 (*)    All other components within normal limits  DIGOXIN LEVEL  URINALYSIS, ROUTINE W REFLEX MICROSCOPIC  I-STAT CG4 LACTIC ACID, ED     EKG  EKG Interpretation None       Radiology Dg Chest 2 View  Result Date: 04/05/2016 CLINICAL DATA:  In weakness for several days. Hypotension. No chest pain or shortness of breath. History of chronic CHF, ischemic cardiomyopathy. EXAM: CHEST  2 VIEW COMPARISON:  PA and lateral chest x-ray of March 24, 2016 FINDINGS: There is a persistent moderate-sized right pleural effusion resulting in volume loss of the right lung. This is slightly smaller than on the previous study. The left lung is well-expanded. The cardiac silhouette is mildly enlarged. The pulmonary vascularity either is mildly engorged with cephalization. The interstitial markings are coarse though stable. The ICD is in stable position. There is calcification in the wall of the aortic arch. The bony thorax exhibits no acute abnormality. IMPRESSION: Slight interval decrease in the moderate size right pleural effusion since the previous study. There is low-grade CHF which appears stable. Thoracic aortic atherosclerosis. Electronically Signed   By: David  [place] M.D.   On: 04/05/2016 12:21    Procedures Procedures (including critical care time)  Medications Ordered in ED Medications - No data to display   Initial Impression / Assessment and Plan / ED Course  I have reviewed the triage vital signs and the nursing notes.  Pertinent labs & imaging results that were available during my care of the patient were reviewed by me and considered in my medical decision making (see chart for details).    Consult: Cardiology. Will admit for hypotension and ischemic cardiomyopathy.  Final Clinical Impressions(s) / ED Diagnoses   Final diagnoses:  Ischemic cardiomyopathy  Other specified hypotension  Patient has severe comorbid cardiac disease. He has developed symptomatic hypotension with renal insufficiency. Patient does not endorse chest pain at this time. Plan will  be for admission as per consultation with cardiology.  New  Prescriptions New Prescriptions   No medications on file     Arby Barrette, MD 04/05/16 [phone]

## 2016-04-05 NOTE — ED Notes (Signed)
Pt aware of need for urine. Pt also asked not to have IV started unless he really needs it. RN will wait for further orders from MD.

## 2016-04-06 DIAGNOSIS — I5023 Acute on chronic systolic (congestive) heart failure: Secondary | ICD-10-CM

## 2016-04-06 DIAGNOSIS — I9589 Other hypotension: Secondary | ICD-10-CM

## 2016-04-06 DIAGNOSIS — J90 Pleural effusion, not elsewhere classified: Secondary | ICD-10-CM

## 2016-04-06 LAB — URINALYSIS, ROUTINE W REFLEX MICROSCOPIC
Bilirubin Urine: NEGATIVE
Ketones, ur: NEGATIVE mg/dL
NITRITE: NEGATIVE
PH: 5 (ref 5.0–8.0)
Protein, ur: NEGATIVE mg/dL
Specific Gravity, Urine: 1.009 (ref 1.005–1.030)
Squamous Epithelial / LPF: NONE SEEN

## 2016-04-06 LAB — BASIC METABOLIC PANEL
Anion gap: 8 (ref 5–15)
BUN: 55 mg/dL — ABNORMAL HIGH (ref 6–20)
CO2: 33 mmol/L — ABNORMAL HIGH (ref 22–32)
Calcium: 8.2 mg/dL — ABNORMAL LOW (ref 8.9–10.3)
Chloride: 87 mmol/L — ABNORMAL LOW (ref 101–111)
Creatinine, Ser: 1.33 mg/dL — ABNORMAL HIGH (ref 0.61–1.24)
GFR calc Af Amer: >60 mL/min (ref >60)
GFR calc non Af Amer: 56 mL/min — ABNORMAL LOW (ref >60)
Glucose, Bld: 280 mg/dL — ABNORMAL HIGH (ref 65–99)
Potassium: 3.5 mmol/L (ref 3.5–5.1)
Sodium: 128 mmol/L — ABNORMAL LOW (ref 135–145)

## 2016-04-06 LAB — COOXEMETRY PANEL
CARBOXYHEMOGLOBIN: 2.3 % — AB (ref 0.5–1.5)
Carboxyhemoglobin: 2 % — ABNORMAL HIGH (ref 0.5–1.5)
Carboxyhemoglobin: 1.8 % — ABNORMAL HIGH (ref 0.5–1.5)
Methemoglobin: 0.9 % (ref 0.0–1.5)
Methemoglobin: 0.6 % (ref 0.0–1.5)
Methemoglobin: 1.1 % (ref 0.0–1.5)
O2 SAT: 58.9 %
O2 Saturation: 42.2 %
O2 Saturation: 56.1 %
Total hemoglobin: 12.6 g/dL (ref 12.0–16.0)
Total hemoglobin: 15.2 g/dL (ref 12.0–16.0)
Total hemoglobin: 11.2 g/dL — ABNORMAL LOW (ref 12.0–16.0)

## 2016-04-06 LAB — MAGNESIUM: Magnesium: 2.3 mg/dL (ref 1.7–2.4)

## 2016-04-06 LAB — GLUCOSE, CAPILLARY
GLUCOSE-CAPILLARY: 257 mg/dL — AB (ref 65–99)
GLUCOSE-CAPILLARY: 467 mg/dL — AB (ref 65–99)

## 2016-04-06 LAB — HIV ANTIBODY (ROUTINE TESTING W REFLEX): HIV SCREEN 4TH GENERATION: NONREACTIVE

## 2016-04-06 MED ORDER — POTASSIUM CHLORIDE CRYS ER 20 MEQ PO TBCR
40.0000 meq | EXTENDED_RELEASE_TABLET | Freq: Two times a day (BID) | ORAL | Status: DC
  Administered 2016-04-06 – 2016-04-10 (×9): 40 meq via ORAL
  Filled 2016-04-06 (×10): qty 2

## 2016-04-06 MED ORDER — ORAL CARE MOUTH RINSE
15.0000 mL | Freq: Two times a day (BID) | OROMUCOSAL | Status: DC
  Administered 2016-04-06 – 2016-04-10 (×8): 15 mL via OROMUCOSAL

## 2016-04-06 MED ORDER — INSULIN GLARGINE 100 UNIT/ML ~~LOC~~ SOLN
10.0000 [IU] | Freq: Every day | SUBCUTANEOUS | Status: DC
  Administered 2016-04-06 – 2016-04-08 (×3): 10 [IU] via SUBCUTANEOUS
  Filled 2016-04-06 (×4): qty 0.1

## 2016-04-06 MED ORDER — MILRINONE LACTATE IN DEXTROSE 20-5 MG/100ML-% IV SOLN
0.1250 ug/kg/min | INTRAVENOUS | Status: DC
  Administered 2016-04-06 – 2016-04-07 (×2): 0.125 ug/kg/min via INTRAVENOUS
  Filled 2016-04-06 (×2): qty 100

## 2016-04-06 MED ORDER — FUROSEMIDE 10 MG/ML IJ SOLN
80.0000 mg | Freq: Two times a day (BID) | INTRAMUSCULAR | Status: DC
  Administered 2016-04-06 – 2016-04-09 (×7): 80 mg via INTRAVENOUS
  Filled 2016-04-06 (×7): qty 8

## 2016-04-06 MED ORDER — INSULIN ASPART 100 UNIT/ML ~~LOC~~ SOLN
15.0000 [IU] | Freq: Once | SUBCUTANEOUS | Status: AC
  Administered 2016-04-06: 15 [IU] via SUBCUTANEOUS

## 2016-04-06 NOTE — Care Management Note (Addendum)
Case Management Note  Patient Details  Name: [name] MRN: [id number] Date of Birth: [birth year]  Subjective/Objective:   Presents from home with weakness, hypotension, acute on chronic systolic chf, cad, pad, carotid stenosis, r pl effusion and DM.  He states at discharge he will be going to stay with his dad in [name].  Patient works at [employer], drives a Chief Executive Officer.  He was interested in disability information, NCM informed him that he would need to apply at the DSS for disability.  pta indep.  His PCP is Sigmund Hazel at [facility],  NCM will cont to follow for dc needs.                   Action/Plan:   Expected Discharge Date:                  Expected Discharge Plan:  Home/Self Care  In-House Referral:     Discharge planning Services  CM Consult  Post Acute Care Choice:    Choice offered to:     DME Arranged:    DME Agency:     HH Arranged:    HH Agency:     Status of Service:  In process, will continue to follow  If discussed at Long Length of Stay Meetings, dates discussed:    Additional Comments:  Leone Haven, RN 04/06/2016, 4:40 PM

## 2016-04-06 NOTE — Consult Note (Signed)
[address]       [place] [address]             [phone]        [name] [place] Medical Record #[id number] Date of Birth: 10/25/1955  Referring: No ref. provider found Primary Care: [name], MD  Chief Complaint:    Chief Complaint  Patient presents with  . Weakness    History of Present Illness:     [name] is a 61 year old male with a past medical history of PAD with a left fem-pop bypass with graft in 2017, carotid artery stenosis, diabetes mellitus type 2, hypertension, hyperlipidemia, tobacco abuse, coronary artery disease, and ischemic cardiomyopathy s/p St, Jude ICD who was admitted on 04/05/2016 from cardiac rehab due to several days of generalized weakness and fatigue. He explained that yesterday in the car he became dizzy and had a sickening feeling in his stomach. He pulled over and took his blood sugar which was elevated.  On arrival to the ED the patient was found to be hypotensive. Heart failure admitted the patient and has him on an IV diuretic regimen. He was found to have a moderate sized right pleural effusions on CXR yesterday. He had an US guided right thoracentesis on 01/05/2016. We have been consulted for right pleurx catheter placement.   Current Activity/ Functional Status: Patient was independent with mobility/ambulation, transfers, ADL's, IADL's.   Zubrod Score: At the time of surgery this patient's most appropriate activity status/level should be described as: []     0    Normal activity, no symptoms [x]     1    Restricted in physical strenuous activity but ambulatory, able to do out light work []     2    Ambulatory and capable of self care, unable to do work activities, up and about                 more than 50%  Of the time                            []     3    Only limited self care, in bed greater than 50% of waking hours []     4    Completely disabled, no self care, confined to bed or chair []     5     Moribund  Past Medical History:  Diagnosis Date  . AICD (automatic cardioverter/defibrillator) present   . Barretts syndrome   . Carotid artery disease (HCC)    a. Dopp 09/2011: 60-79% RICA, 40-59% LICA.;  b.  Carotid US (7/14):  Bilateral 60-79% => f/u 6 mos  . Cerebrovascular disease   . Chronic systolic heart failure (HCC)   . Coronary artery disease    a. AWMI requiring IABP 2005 s/p Horizon study stent-LAD, staged BMS-Cx, DESx2-RCA. b. Last Last LHC (6/06):  EF 25%, pLAD stent 40-50, after stent 40, dLAD 20, pCFX 20, pOM1 70, pRCA 20, mRCA stents ok.=> med Rx;  c.  Myoview (7/14):  EF 26%, large ant, septal, inf and apical infarct, no ischemia.  Med Rx continued  . Diabetes mellitus   . Heart murmur   . History of kidney stones   . HTN (hypertension)   . Hyperlipidemia    mixed  . ICD (implantable cardiac defibrillator), dual, in situ    St. Jude for severe LVD EF 25% 2/07  explanted 2010. Medtronic Virtuoso II DR Dual-chamber cardiverter-defibrillator  with pocket revision, Dr. Graciela Husbands  . Ischemic cardiomyopathy    a. Echo (09/27/12): EF 20%, diffuse HK with periapical AK, no LV thrombus noted, restrictive physiology, trivial MR, mild LAE, RVSF mildly reduced, PASP 39  . Pleural effusion 01/05/2016  . Presence of permanent cardiac pacemaker   . PVD (peripheral vascular disease) (HCC)    a. ABI 09/2011 - R normal, L moderate - saw Dr. Kirke Corin - med rx.   Marland Kitchen RIATA ICD Lead--on advisory recall    . Tobacco abuse     Past Surgical History:  Procedure Laterality Date  . ABDOMINAL AORTAGRAM N/A 11/06/2013   Procedure: ABDOMINAL Ronny Flurry;  Surgeon: Chuck Hint, MD;  Location: [facility] CATH LAB;  Service: Cardiovascular;  Laterality: N/A;  . APPENDECTOMY  2000   ruptured  . CARDIAC CATHETERIZATION N/A 01/07/2016   Procedure: Right/Left Heart Cath and Coronary Angiography;  Surgeon: Laurey Morale, MD;  Location: [facility] INVASIVE CV LAB;  Service: Cardiovascular;  Laterality: N/A;  . CARDIAC  CATHETERIZATION N/A 01/28/2016   Procedure: Coronary Stent Intervention;  Surgeon: Tonny Bollman, MD;  Location: [facility] INVASIVE CV LAB;  Service: Cardiovascular;  Laterality: N/A;  . CARDIAC CATHETERIZATION N/A 01/28/2016   Procedure: Right Heart Cath;  Surgeon: Tonny Bollman, MD;  Location: [facility] INVASIVE CV LAB;  Service: Cardiovascular;  Laterality: N/A;  . CARDIAC CATHETERIZATION N/A 01/28/2016   Procedure: Intravascular Ultrasound/IVUS;  Surgeon: Tonny Bollman, MD;  Location: [facility] INVASIVE CV LAB;  Service: Cardiovascular;  Laterality: N/A;  . FEMORAL-POPLITEAL BYPASS GRAFT Right 11/07/2013   Procedure: Right FEMORAL-POPLITEAL ARTERY Bypass ;  Surgeon: Chuck Hint, MD;  Location: [facility] OR;  Service: Vascular;  Laterality: Right;  . FEMORAL-POPLITEAL BYPASS GRAFT Left 11/17/2014   Procedure: BYPASS GRAFT FEMORAL-POPLITEAL ARTERY USING GORE PROPATEN X 80CM VASCULAR GRAFT;  Surgeon: Chuck Hint, MD;  Location: [facility] OR;  Service: Vascular;  Laterality: Left;  . IMPLANTABLE CARDIOVERTER DEFIBRILLATOR (ICD) GENERATOR CHANGE N/A 12/16/2012   Procedure: ICD GENERATOR CHANGE;  Surgeon: [name], MD;  Location: [facility] CATH LAB;  Service: Cardiovascular;  Laterality: N/A;  . INTRAOPERATIVE ARTERIOGRAM Right 11/07/2013   Procedure: INTRA OPERATIVE ARTERIOGRAM;  Surgeon: Chuck Hint, MD;  Location: [facility] OR;  Service: Vascular;  Laterality: Right;  . LOWER EXTREMITY ANGIOGRAM Bilateral 11/06/2013   Procedure: LOWER EXTREMITY ANGIOGRAM;  Surgeon: Chuck Hint, MD;  Location: [facility] CATH LAB;  Service: Cardiovascular;  Laterality: Bilateral;  . Medtronic Virtuoso II DR dual-chamber cardioverter-defibrillation with pocket revison     Dr. Sherryl Manges  . PERIPHERAL VASCULAR CATHETERIZATION N/A 09/25/2014   Procedure: Abdominal Aortogram;  Surgeon: Sherren Kerns, MD;  Location: [facility] INVASIVE CV LAB;  Service: Cardiovascular;  Laterality: N/A;  . US GUIDED THORACENTESIS RIGHT ([facility] HX)  Right 01/05/2016    History  Smoking Status  . Current Every Day Smoker  . Packs/day: 1.00  . Years: 45.00  . Types: Cigarettes  Smokeless Tobacco  . Never Used    Comment: pt reports he is stress smoker    History  Alcohol Use No    Social History   Social History  . Marital status: Divorced    Spouse name: N/A  . Number of children: N/A  . Years of education: N/A   Occupational History  . 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 Smoking status: Current Every Day Smoker    Packs/day:  1.00    Years: 45.00    Types: Cigarettes  . Smokeless tobacco: Never Used     Comment: pt reports he is stress smoker  . Alcohol use No  . Drug use: No     Comment: former Cocain, Acid, Marijuana - 25 years ago  . Sexual activity: No   Other Topics Concern  . Not on file   Social History Narrative   Divorced.    Allergies  Allergen Reactions  . Niacin Itching and Other (See Comments)    Flushing   . Metformin And Related Nausea And Vomiting    *Only the extended release* (does this)    Current Facility-Administered Medications  Medication Dose Route Frequency Provider Last Rate Last Dose  . 0.9 %  sodium chloride infusion  250 mL Intravenous PRN Janetta Hora, PA-C      . acetaminophen (TYLENOL) tablet 650 mg  650 mg Oral Q4H PRN Janetta Hora, PA-C      . aspirin EC tablet 81 mg  81 mg Oral Daily Janetta Hora, PA-C      . [START ON 04/07/2016] atorvastatin (LIPITOR) tablet 80 mg  80 mg Oral QODAY Janetta Hora, PA-C      . Chlorhexidine Gluconate Cloth 2 % PADS 6 each  6 each Topical Daily Dolores Patty, MD      . digoxin (LANOXIN) tablet 0.125 mg  0.125 mg Oral Daily Janetta Hora, PA-C      . ezetimibe (ZETIA) tablet 10 mg  10 mg Oral Daily Janetta Hora, PA-C      . furosemide (LASIX) injection 80 mg  80 mg Intravenous BID Sherald Hess, NP   80 mg at 04/06/16 0747  . heparin injection 5,000  Units  5,000 Units Subcutaneous Q8H Janetta Hora, PA-C   5,000 Units at 04/06/16 4098  . insulin aspart (novoLOG) injection 0-15 Units  0-15 Units Subcutaneous TID WC Arlester Marker, MD      . insulin aspart (novoLOG) injection 0-5 Units  0-5 Units Subcutaneous QHS Arlester Marker, MD   5 Units at 04/05/16 2158  . ondansetron (ZOFRAN) injection 4 mg  4 mg Intravenous Q6H PRN Janetta Hora, PA-C      . potassium chloride SA (K-DUR,KLOR-CON) CR tablet 40 mEq  40 mEq Oral BID Amy D Clegg, NP      . sodium chloride flush (NS) 0.9 % injection 10-40 mL  10-40 mL Intracatheter Q12H Dolores Patty, MD   10 mL at 04/05/16 2200  . sodium chloride flush (NS) 0.9 % injection 10-40 mL  10-40 mL Intracatheter PRN Dolores Patty, MD      . sodium chloride flush (NS) 0.9 % injection 3 mL  3 mL Intravenous Q12H Janetta Hora, PA-C   3 mL at 04/05/16 2200  . sodium chloride flush (NS) 0.9 % injection 3 mL  3 mL Intravenous PRN Janetta Hora, PA-C        Prescriptions Prior to Admission  Medication Sig Dispense Refill Last Dose  . aspirin 81 MG tablet Take 1 tablet (81 mg total) by mouth daily. 30 tablet 3 04/05/2016 at 0600  . atorvastatin (LIPITOR) 80 MG tablet Take 1 tablet (80 mg total) by mouth every other day. 30 tablet 11 04/05/2016 at am  . carvedilol (COREG) 6.25 MG tablet Take 3.125 mg by mouth 2 (two) times daily with a meal.   04/05/2016 at 0600  . clopidogrel (PLAVIX)  75 MG tablet Take 1 tablet (75 mg total) by mouth daily. 30 tablet 6 04/05/2016 at 0600  . digoxin (LANOXIN) 0.125 MG tablet Take 1 tablet (0.125 mg total) by mouth daily. 30 tablet 11 04/05/2016 at am  . ezetimibe (ZETIA) 10 MG tablet TAKE ONE TABLET BY MOUTH ONCE DAILY 30 tablet 6 04/05/2016 at am  . insulin NPH-regular Human (NOVOLIN 70/30) (70-30) 100 UNIT/ML injection Inject 16-32 Units into the skin 2 (two) times daily with a meal. 32 units in the morning, 16 units in the evening   04/05/2016 at am  . losartan  (COZAAR) 25 MG tablet Take 25 mg by mouth daily.   04/05/2016 at am  . metolazone (ZAROXOLYN) 2.5 MG tablet Take 2.5 mg (1 tablet) once weekly on Saturday mornings. 90 tablet 3 04/01/2016 at am  . potassium chloride SA (K-DUR,KLOR-CON) 20 MEQ tablet Take 1 tablet (20 mEq total) by mouth every other day. Take extra 2 meq (1 tablet) on Saturdays with metolazone. (Patient taking differently: Take 20 mEq by mouth every other day. AND TAKE AN EXTRA 20 MEQ ON SATURDAYS WITH METOLAZONE) 20 tablet 6 04/05/2016 at am  . spironolactone (ALDACTONE) 25 MG tablet Take 1 tablet (25 mg total) by mouth daily. 30 tablet 0 04/05/2016 at am  . torsemide (DEMADEX) 20 MG tablet Take 80 mg by mouth 2 (two) times daily.   04/02/2016 at pm    Family History  Problem Relation Age of Onset  . Diabetes Mother   . Coronary artery disease Father   . Heart disease Father     before age 65     Review of Systems:  Pertinent items are noted in HPI.     Cardiac Review of Systems: Y or N  Chest Pain [  no  ]  Resting SOB [ no  ] Exertional SOB  [  ]  Orthopnea [  ]   Pedal Edema [  yes ]   Palpitations [  ] Syncope  [ no  ]   Presyncope [ yes  ]  General Review of Systems: [Y] = yes [  ]=no Constitional: recent weight change [  ]; anorexia [ no  ]; fatigue [ yes ]; nausea [ yes ]; night sweats [  ]; fever [ no ]; or chills [ no ]                                                               Dental: poor dentition[ yes ]; Last Dentist visit:   Eye : blurred vision [  ]; diplopia [   ]; vision changes [  ];  Amaurosis fugax[  ]; Resp: cough [ no ];  wheezing[ no ];  hemoptysis[ no ]; shortness of breath[  ]; paroxysmal nocturnal dyspnea[  ]; dyspnea on exertion[  ]; or orthopnea[  ];  GI:  gallstones[  ], vomiting[ no  ];  dysphagia[ no ]; melena[  ];  hematochezia [  ]; heartburn[  ];   Hx of  Colonoscopy[  ]; GU: kidney stones [  ]; hematuria[  ];   dysuria [  ];  nocturia[  ];  history of  obstruction [ no ]; urinary frequency [   ]  Skin: rash, swelling[  ];, hair loss[  ];  peripheral edema[ yes ];  or itching[  ]; Musculosketetal: myalgias[  ];  joint swelling[  ];  joint erythema[  ];  joint pain[  ];  back pain[  ];  Heme/Lymph: bruising[  ];  bleeding[  ];  anemia[  ];  Neuro: TIA[  ];  headaches[  ];  stroke[ no ];  vertigo[  ];  seizures[ no ];   paresthesias[ yes  ];  difficulty walking[  ]; Dizziness [ yes]  Psych:depression[ no ]; anxiety[ no ];  Endocrine: diabetes[ yes ];  thyroid dysfunction[ no ];  Immunizations: Flu [  ]; Pneumococcal[  ];  Other:  Physical Exam: BP 104/66   Pulse 73   Temp 97.7 F (36.5 C) (Oral)   Resp 20   Ht 6' (1.829 m)   Wt 101.2 kg (223 lb)   SpO2 97%   BMI 30.24 kg/m    General appearance: alert, cooperative and no distress Resp: diminished breath sounds on the right side in all fields. CTA on the the left. Cardio: regular rate and rhythm, S1, S2 normal, no murmur, click, rub or gallop GI: soft, non-tender; bowel sounds normal; no masses,  no organomegaly Extremities: chronic ulcers and hyperpigmentation from PAD Neurologic: Grossly normal      Recent Radiology Findings:   Dg Chest 2 View  Result Date: 04/05/2016 CLINICAL DATA:  In weakness for several days. Hypotension. No chest pain or shortness of breath. History of chronic CHF, ischemic cardiomyopathy. EXAM: CHEST  2 VIEW COMPARISON:  PA and lateral chest x-ray of March 24, 2016 FINDINGS: There is a persistent moderate-sized right pleural effusion resulting in volume loss of the right lung. This is slightly smaller than on the previous study. The left lung is well-expanded. The cardiac silhouette is mildly enlarged. The pulmonary vascularity either is mildly engorged with cephalization. The interstitial markings are coarse though stable. The ICD is in stable position. There is calcification in the wall of the aortic arch. The bony thorax exhibits no acute abnormality. IMPRESSION: Slight interval decrease  in the moderate size right pleural effusion since the previous study. There is low-grade CHF which appears stable. Thoracic aortic atherosclerosis. Electronically Signed   By: David  [place] M.D.   On: 04/05/2016 12:21     I have independently reviewed the above radiologic studies.  Recent Lab Findings: Lab Results  Component Value Date   WBC 7.0 04/05/2016   HGB 11.6 (L) 04/05/2016   HCT 34.0 (L) 04/05/2016   PLT 118 (L) 04/05/2016   GLUCOSE 280 (H) 04/06/2016   CHOL 103 03/10/2016   TRIG 43 03/10/2016   HDL 49 03/10/2016   LDLDIRECT 158.4 08/13/2009   LDLCALC 45 03/10/2016   ALT 31 02/01/2016   AST 28 02/01/2016   NA 128 (L) 04/06/2016   K 3.5 04/06/2016   CL 87 (L) 04/06/2016   CREATININE 1.33 (H) 04/06/2016   BUN 55 (H) 04/06/2016   CO2 33 (H) 04/06/2016   TSH 2.565 09/26/2012   INR 1.07 01/18/2016   HGBA1C 7.6 (H) 01/05/2016      Assessment / Plan:      Plan for Pleurx catheter insertion tomorrow with Dr. Donata Clay. The procedure was explained in great detail and all questions were answered to the patient's satisfaction.     I  spent 30 minutes counseling the patient face to face and 50% or more the  time was spent in counseling and coordination of  care. The total time spent in the appointment was 30 minutes.    Jari Favre, PA-C  04/06/2016 9:03 AM  Patient examined, cxr and ct chest images personally reviewed. 61 yo chronically ill male with ef .25 advanced HF and recurrent R effusion from HF. Had 2 L removed with thoracentesis jan 018 - cytology and cultures negative. Now with large R effusion, symptomatic. He will benefit from R Pleurx cath which will be placed tomorrow pm - procedure benefits and risks d/w patient P Donata Clay MD

## 2016-04-06 NOTE — Progress Notes (Signed)
Second page sent to NP Clegg to inform of patient's Co-Ox result.   1040 - page sent via amion to heart failure pager to inform of Co-Ox results.

## 2016-04-06 NOTE — Progress Notes (Signed)
Patient's CBG 467, spoke with NP Clegg.  Lantus just ordered, NP Clegg advised okay to give lantus and 15 unit sliding scale insulin and recheck CBG at 1600.

## 2016-04-06 NOTE — Progress Notes (Signed)
   Earlier today CO-OX 42%. Milrinone 0.125 mcg added.   Repeat CO-OX up to 59% on milrinone 0.125 mcg. Continue and repeat in am.   Amy Clegg NP-C  3:10 PM

## 2016-04-06 NOTE — Progress Notes (Signed)
Inpatient Diabetes Program Recommendations  AACE/ADA: New Consensus Statement on Inpatient Glycemic Control (2015)  Target Ranges:  Prepandial:   less than 140 mg/dL      Peak postprandial:   less than 180 mg/dL (1-2 hours)      Critically ill patients:  140 - 180 mg/dL   Lab Results  Component Value Date   GLUCAP 257 (H) 04/06/2016   HGBA1C 7.6 (H) 01/05/2016    Review of Glycemic Control Results for [name], [name] (MRN [id number]) as of 04/06/2016 09:22  Ref. Range 04/05/2016 08:49 04/05/2016 17:52 04/05/2016 21:53 04/06/2016 08:08  Glucose-Capillary Latest Ref Range: 65 - 99 mg/dL 409 (H) 811 (H) 914 (H) 257 (H)   Diabetes history: DM2 Outpatient Diabetes medications: NPH 32 units am + 16 units pm Current orders for Inpatient glycemic control: Novolog correction 0-15 units tid + 0-5 units hs  Inpatient Diabetes Program Recommendations:    Please consider: -Lantus 20 units daily (.2 units/kg)  Thank you, Darel Hong E. Helyn Schwan, RN, MSN, CDE  Diabetes Coordinator Inpatient Glycemic Control Team Team Pager [phone] (8am-5pm) 04/06/2016 10:01 AM

## 2016-04-06 NOTE — Progress Notes (Signed)
Paged Heart Failure pager via Amion to inform of follow up co-ox result 58.9.

## 2016-04-06 NOTE — Progress Notes (Signed)
Advanced Heart Failure Rounding Note   Subjective:    Admitted with low output heart failure, volume overload, and worsening renal function. PICC placed. Mixed venous saturation 63%.   Today he is complaining of fatigue. SOB with exertion. Poor appetite.     Objective:   Weight Range:  Vital Signs:   Temp:  [97.4 F (36.3 C)-98.5 F (36.9 C)] 98.2 F (36.8 C) (04/05 0344) Pulse Rate:  [67-80] 75 (04/05 0600) Resp:  [9-23] 11 (04/05 0600) BP: (76-105)/(33-83) 97/70 (04/05 0600) SpO2:  [86 %-100 %] 100 % (04/05 0600) Weight:  [215 lb (97.5 kg)-224 lb 14.4 oz (102 kg)] 223 lb (101.2 kg) (04/05 0630) Last BM Date: 04/05/16  Weight change: Filed Weights   04/05/16 0841 04/05/16 1750 04/06/16 0630  Weight: 224 lb 14.4 oz (102 kg) 223 lb 8.7 oz (101.4 kg) 223 lb (101.2 kg)    Intake/Output:   Intake/Output Summary (Last 24 hours) at 04/06/16 0703 Last data filed at 04/06/16 0630  Gross per 24 hour  Intake              240 ml  Output              900 ml  Net             -660 ml     Physical Exam: CVP 11-12  General:  Chronically ill appearing. No resp difficulty. Sitting in the chair.  HEENT: normal Neck: supple. JVP 11-12. Carotids 2+ bilat; no bruits. No lymphadenopathy or thryomegaly appreciated. Cor: PMI nondisplaced. Regular rate & rhythm. No rubs or murmurs. + S3. Lungs: Decreased on R.  Abdomen: soft, nontender, nondistended. No hepatosplenomegaly. No bruits or masses. Good bowel sounds. Extremities: no cyanosis, clubbing, rash, R and LLE 1-2+ edema. RUE PICC.  Neuro: alert & orientedx3, cranial nerves grossly intact. moves all 4 extremities w/o difficulty. Affect pleasant Skin: R and LLE chronic hyperpigmentation.   Telemetry: NSR with PVCs. Personally reviewed.   Labs: Basic Metabolic Panel:  Recent Labs Lab 04/05/16 0900 04/05/16 2038 04/06/16 0404  NA 128*  --  128*  K 3.0*  --  3.5  CL 84*  --  87*  CO2 30  --  33*  GLUCOSE 191*  --  280*    BUN 60*  --  55*  CREATININE 1.93* 1.56* 1.33*  CALCIUM 8.8*  --  8.2*    Liver Function Tests: No results for input(s): AST, ALT, ALKPHOS, BILITOT, PROT, ALBUMIN in the last 168 hours. No results for input(s): LIPASE, AMYLASE in the last 168 hours. No results for input(s): AMMONIA in the last 168 hours.  CBC:  Recent Labs Lab 04/05/16 0900 04/05/16 2038  WBC 8.0 7.0  HGB 11.8* 11.6*  HCT 35.2* 34.0*  MCV 85.9 86.1  PLT 136* 118*    Cardiac Enzymes:  Recent Labs Lab 04/05/16 1107  TROPONINI 0.06*    BNP: BNP (last 3 results)  Recent Labs  02/08/16 0946 03/10/16 1048 04/05/16 1221  BNP 321.5* 487.7* 444.6*    ProBNP (last 3 results) No results for input(s): PROBNP in the last 8760 hours.    Other results:  Imaging: Dg Chest 2 View  Result Date: 04/05/2016 CLINICAL DATA:  In weakness for several days. Hypotension. No chest pain or shortness of breath. History of chronic CHF, ischemic cardiomyopathy. EXAM: CHEST  2 VIEW COMPARISON:  PA and lateral chest x-ray of March 24, 2016 FINDINGS: There is a persistent moderate-sized right pleural effusion resulting  in volume loss of the right lung. This is slightly smaller than on the previous study. The left lung is well-expanded. The cardiac silhouette is mildly enlarged. The pulmonary vascularity either is mildly engorged with cephalization. The interstitial markings are coarse though stable. The ICD is in stable position. There is calcification in the wall of the aortic arch. The bony thorax exhibits no acute abnormality. IMPRESSION: Slight interval decrease in the moderate size right pleural effusion since the previous study. There is low-grade CHF which appears stable. Thoracic aortic atherosclerosis. Electronically Signed   By: David  [place] M.D.   On: 04/05/2016 12:21      Medications:     Scheduled Medications: . aspirin EC  81 mg Oral Daily  . [START ON 04/07/2016] atorvastatin  80 mg Oral QODAY  .  Chlorhexidine Gluconate Cloth  6 each Topical Daily  . clopidogrel  75 mg Oral Daily  . digoxin  0.125 mg Oral Daily  . ezetimibe  10 mg Oral Daily  . heparin  5,000 Units Subcutaneous Q8H  . insulin aspart  0-15 Units Subcutaneous TID WC  . insulin aspart  0-5 Units Subcutaneous QHS  . [START ON 04/07/2016] potassium chloride SA  20 mEq Oral QODAY  . sodium chloride flush  10-40 mL Intracatheter Q12H  . sodium chloride flush  3 mL Intravenous Q12H     Infusions:   PRN Medications:  sodium chloride, acetaminophen, ondansetron (ZOFRAN) IV, sodium chloride flush, sodium chloride flush   Assessment/Plan/Discussion    1. A/C Systolic Heart Failure ICM. RV mildly dilated. ECHO 03/24/2016 EF 25-30%. St Jude ICD Today CO-OX is 63%. Can hold off on intotropes.  Volume status mildly elevated. CVP 11-12. Give 80 mg IV lasix twice daily. Add 40 meq potassium twice a day. No bb. No Arb with hypotension.  2. CAD: RCA.  - LHC (1/18): Diffuse up to 80% calcified stenosis throughout the proximal LAD. Diffuse disease in distal LAD up to 50%. Large OM1 totally occluded at the ostium but reconstituted via collaterals. 80% distal RCA stenosis involving the ostia of the PLV and PDA with about 70% stenosis. Patient had PCI with DES to proximal LAD and PTCA OM1 (unable to place stent).  No CP. Continue statin and aspirin. Hold plavix for pleurex catheter.   3. PVCs- Supplement K . Check mag 4. Hyponatremia- Sodium 128 5. AKI on CKD- on admit creatinine 1.9 but today down to 1.33. BMET in am.  6. DMII- Glucose elevated. Continue sliding scale.  7.  R Pleural Effusion- On CXR R moderate pleural effusion. He has had 2  thoracentesis 01/03/2016 and 01/05/2016. Referred to Dr Tyrone Sage. Will need to come off plavix for pleurex. Hold plavix.    Length of Stay: 1   Amy Clegg NP-C  04/06/2016, 7:03 AM  Advanced Heart Failure Team Pager [phone] (M-F; 7a - 4p)  Please contact [facility] for  night-coverage after hours (4p -7a ) and weekends on [email or web address]  Patient seen and examined with [name], NP. We discussed all aspects of the encounter. I agree with the assessment and plan as stated above.   Continues to feel weak. No energy. Appetite poor. Co-ox from yesterday suprisingly well compensated. Renal function improving.   Suspect he may have had ATN in setting of low BP. Carvedilol and losartan stopped.   Given symptoms I think we need to watch him for at least another 24-48 hours. Will repeat co-ox  CXR reviewed personally. He has recurrent pleural effusion. Will d/w  CCM and TCTS regarding possible Pleurex catheter  CAD stable without angina. Continue CR on d/c.   Arvilla Meres, MD  8:06 AM

## 2016-04-07 ENCOUNTER — Inpatient Hospital Stay (HOSPITAL_COMMUNITY): Payer: BLUE CROSS/BLUE SHIELD

## 2016-04-07 ENCOUNTER — Encounter (HOSPITAL_COMMUNITY): Payer: BLUE CROSS/BLUE SHIELD

## 2016-04-07 ENCOUNTER — Inpatient Hospital Stay (HOSPITAL_COMMUNITY): Payer: BLUE CROSS/BLUE SHIELD | Admitting: Certified Registered Nurse Anesthetist

## 2016-04-07 ENCOUNTER — Encounter (HOSPITAL_COMMUNITY)
Admission: RE | Admit: 2016-04-07 | Payer: BLUE CROSS/BLUE SHIELD | Source: Ambulatory Visit | Attending: Cardiovascular Disease | Admitting: Cardiovascular Disease

## 2016-04-07 ENCOUNTER — Encounter (HOSPITAL_COMMUNITY)
Admission: EM | Disposition: A | Discharge status: Home Health | Payer: Self-pay | Source: Home / Self Care | Attending: Internal Medicine

## 2016-04-07 HISTORY — PX: CHEST TUBE INSERTION: SHX231

## 2016-04-07 LAB — GLUCOSE, CAPILLARY
GLUCOSE-CAPILLARY: 200 mg/dL — AB (ref 65–99)
GLUCOSE-CAPILLARY: 291 mg/dL — AB (ref 65–99)
Glucose-Capillary: 246 mg/dL — ABNORMAL HIGH (ref 65–99)
Glucose-Capillary: 230 mg/dL — ABNORMAL HIGH (ref 65–99)
Glucose-Capillary: 187 mg/dL — ABNORMAL HIGH (ref 65–99)
Glucose-Capillary: 161 mg/dL — ABNORMAL HIGH (ref 65–99)
Glucose-Capillary: 186 mg/dL — ABNORMAL HIGH (ref 65–99)

## 2016-04-07 LAB — BASIC METABOLIC PANEL
Anion gap: 8 (ref 5–15)
BUN: 33 mg/dL — ABNORMAL HIGH (ref 6–20)
CO2: 32 mmol/L (ref 22–32)
Calcium: 8.1 mg/dL — ABNORMAL LOW (ref 8.9–10.3)
Chloride: 90 mmol/L — ABNORMAL LOW (ref 101–111)
Creatinine, Ser: 0.88 mg/dL (ref 0.61–1.24)
GFR calc Af Amer: >60 mL/min (ref >60)
GFR calc non Af Amer: >60 mL/min (ref >60)
Glucose, Bld: 194 mg/dL — ABNORMAL HIGH (ref 65–99)
Potassium: 3.9 mmol/L (ref 3.5–5.1)
Sodium: 130 mmol/L — ABNORMAL LOW (ref 135–145)

## 2016-04-07 LAB — COOXEMETRY PANEL
Carboxyhemoglobin: 1.5 % (ref 0.5–1.5)
Methemoglobin: 1 % (ref 0.0–1.5)
O2 Saturation: 58.5 %
Total hemoglobin: 11.6 g/dL — ABNORMAL LOW (ref 12.0–16.0)

## 2016-04-07 SURGERY — INSERTION, PLEURAL DRAINAGE CATHETER
Anesthesia: Monitor Anesthesia Care | Site: Chest | Laterality: Right

## 2016-04-07 MED ORDER — METOLAZONE 5 MG PO TABS
5.0000 mg | ORAL_TABLET | Freq: Once | ORAL | Status: AC
  Administered 2016-04-07: 5 mg via ORAL
  Filled 2016-04-07: qty 1

## 2016-04-07 MED ORDER — POTASSIUM CHLORIDE CRYS ER 20 MEQ PO TBCR
40.0000 meq | EXTENDED_RELEASE_TABLET | Freq: Once | ORAL | Status: AC
  Administered 2016-04-07: 40 meq via ORAL
  Filled 2016-04-07: qty 2

## 2016-04-07 MED ORDER — FENTANYL CITRATE (PF) 250 MCG/5ML IJ SOLN
INTRAMUSCULAR | Status: AC
Start: 2016-04-07 — End: 2016-04-07
  Filled 2016-04-07: qty 5

## 2016-04-07 MED ORDER — HYDROCODONE-ACETAMINOPHEN 5-325 MG PO TABS: 1.0000 | ORAL_TABLET | ORAL | Status: DC | PRN

## 2016-04-07 MED ORDER — MIDAZOLAM HCL 2 MG/2ML IJ SOLN
INTRAMUSCULAR | Status: AC
  Filled 2016-04-07: qty 2

## 2016-04-07 MED ORDER — MIDAZOLAM HCL 5 MG/5ML IJ SOLN
INTRAMUSCULAR | Status: DC | PRN
  Administered 2016-04-07 (×2): 1 mg via INTRAVENOUS

## 2016-04-07 MED ORDER — PROPOFOL 10 MG/ML IV BOLUS
INTRAVENOUS | Status: AC
  Filled 2016-04-07: qty 20

## 2016-04-07 MED ORDER — LIDOCAINE HCL 1 % IJ SOLN
INTRAMUSCULAR | Status: DC | PRN
  Administered 2016-04-07: 15 mL

## 2016-04-07 MED ORDER — CEFAZOLIN SODIUM 1 G IJ SOLR
INTRAMUSCULAR | Status: DC | PRN
  Administered 2016-04-07: 2 g via INTRAMUSCULAR

## 2016-04-07 MED ORDER — KETAMINE HCL-SODIUM CHLORIDE 100-0.9 MG/10ML-% IV SOSY
PREFILLED_SYRINGE | INTRAVENOUS | Status: AC
  Filled 2016-04-07: qty 10

## 2016-04-07 MED ORDER — FENTANYL CITRATE (PF) 100 MCG/2ML IJ SOLN
INTRAMUSCULAR | Status: DC | PRN
  Administered 2016-04-07 (×2): 50 ug via INTRAVENOUS

## 2016-04-07 MED ORDER — 0.9 % SODIUM CHLORIDE (POUR BTL) OPTIME
TOPICAL | Status: DC | PRN
  Administered 2016-04-07: 1000 mL

## 2016-04-07 MED ORDER — LACTATED RINGERS IV SOLN
INTRAVENOUS | Status: DC
  Administered 2016-04-07: 16:00:00 via INTRAVENOUS

## 2016-04-07 SURGICAL SUPPLY — 30 items
ADH SKN CLS APL DERMABOND .7 (GAUZE/BANDAGES/DRESSINGS) ×1
CANISTER SUCT 3000ML PPV (MISCELLANEOUS) ×3 IMPLANT
COVER SURGICAL LIGHT HANDLE (MISCELLANEOUS) ×3 IMPLANT
DERMABOND ADVANCED (GAUZE/BANDAGES/DRESSINGS) ×2
DERMABOND ADVANCED .7 DNX12 (GAUZE/BANDAGES/DRESSINGS) ×1 IMPLANT
DRAPE C-ARM 42X72 X-RAY (DRAPES) ×3 IMPLANT
DRAPE LAPAROSCOPIC ABDOMINAL (DRAPES) ×3 IMPLANT
DRSG TEGADERM 4X4.75 (GAUZE/BANDAGES/DRESSINGS) ×2 IMPLANT
GAUZE SPONGE 4X4 12PLY STRL LF (GAUZE/BANDAGES/DRESSINGS) ×2 IMPLANT
GLOVE BIO SURGEON STRL SZ7.5 (GLOVE) ×6 IMPLANT
GOWN STRL REUS W/ TWL LRG LVL3 (GOWN DISPOSABLE) ×2 IMPLANT
GOWN STRL REUS W/TWL LRG LVL3 (GOWN DISPOSABLE) ×6
KIT BASIN OR (CUSTOM PROCEDURE TRAY) ×3 IMPLANT
KIT PLEURX DRAIN CATH 1000ML (MISCELLANEOUS) ×3 IMPLANT
KIT PLEURX DRAIN CATH 15.5FR (DRAIN) ×3 IMPLANT
KIT ROOM TURNOVER OR (KITS) ×3 IMPLANT
NDL HYPO 25GX1X1/2 BEV (NEEDLE) ×1 IMPLANT
NEEDLE HYPO 25GX1X1/2 BEV (NEEDLE) ×3 IMPLANT
NS IRRIG 1000ML POUR BTL (IV SOLUTION) ×3 IMPLANT
PACK GENERAL/GYN (CUSTOM PROCEDURE TRAY) ×3 IMPLANT
PAD ARMBOARD 7.5X6 YLW CONV (MISCELLANEOUS) ×6 IMPLANT
SET DRAINAGE LINE (MISCELLANEOUS) IMPLANT
SUT ETHILON 3 0 PS 1 (SUTURE) ×3 IMPLANT
SUT SILK 2 0 SH (SUTURE) ×3 IMPLANT
SUT VIC AB 3-0 SH 8-18 (SUTURE) ×3 IMPLANT
SYR CONTROL 10ML LL (SYRINGE) ×3 IMPLANT
TOWEL OR 17X24 6PK STRL BLUE (TOWEL DISPOSABLE) ×3 IMPLANT
TOWEL OR 17X26 10 PK STRL BLUE (TOWEL DISPOSABLE) ×3 IMPLANT
VALVE REPLACEMENT CAP (MISCELLANEOUS) IMPLANT
WATER STERILE IRR 1000ML POUR (IV SOLUTION) ×3 IMPLANT

## 2016-04-07 NOTE — Progress Notes (Signed)
Inpatient Diabetes Program Recommendations  AACE/ADA: New Consensus Statement on Inpatient Glycemic Control (2015)  Target Ranges:  Prepandial:   less than 140 mg/dL      Peak postprandial:   less than 180 mg/dL (1-2 hours)      Critically ill patients:  140 - 180 mg/dL   Lab Results  Component Value Date   GLUCAP 187 (H) 04/07/2016   HGBA1C 7.6 (H) 01/05/2016    Review of Glycemic ControlResults for [name], [name] (MRN [id number]) as of 04/07/2016 10:09  Ref. Range 04/06/2016 08:08 04/06/2016 12:23 04/06/2016 17:06 04/06/2016 21:51 04/07/2016 08:01  Glucose-Capillary Latest Ref Range: 65 - 99 mg/dL 132 (H) 440 (H) 102 (H) 230 (H) 187 (H)   Diabetes history: DM2 Outpatient Diabetes medications: NPH 32 units am + 16 units pm Current orders for Inpatient glycemic control:  Lantus 10 units daily, Novolog moderate tid with meals and HS  Inpatient Diabetes Program Recommendations:   Note patient is NPO today.  May consider increasing Lantus to 20 units daily.  Also once eating, consider adding Novolog meal coverage 5 units tid with meals-Hold if patient eats less than 50%.   Thanks, Beryl Meager, RN, BC-ADM Inpatient Diabetes Coordinator Pager [phone] (8a-5p)

## 2016-04-07 NOTE — Anesthesia Procedure Notes (Signed)
Procedure Name: MAC Date/Time: 04/07/2016 4:07 PM Performed by: Salli Quarry Tenise Stetler Pre-anesthesia Checklist: Patient identified, Emergency Drugs available, Suction available and Patient being monitored Patient Re-evaluated:Patient Re-evaluated prior to inductionOxygen Delivery Method: Simple face mask

## 2016-04-07 NOTE — Anesthesia Preprocedure Evaluation (Addendum)
Anesthesia Evaluation  Patient identified by MRN, date of birth, ID band Patient awake    Reviewed: Allergy & Precautions, H&P , NPO status , Patient's Chart, lab work & pertinent test results, reviewed documented beta blocker date and time   Airway Mallampati: II  TM Distance: >3 FB Neck ROM: Full    Dental  (+) Dental Advisory Given, Loose, Missing   Pulmonary Current Smoker,    Pulmonary exam normal        Cardiovascular hypertension, Pt. on medications and Pt. on home beta blockers + CAD, + Past MI, + Cardiac Stents, + Peripheral Vascular Disease and +CHF  + pacemaker + Cardiac Defibrillator + Valvular Problems/Murmurs MR  Rhythm:Regular Rate:Normal     Neuro/Psych negative neurological ROS  negative psych ROS   GI/Hepatic negative GI ROS, Neg liver ROS,   Endo/Other  diabetes, Insulin Dependent  Renal/GU negative Renal ROS  negative genitourinary   Musculoskeletal   Abdominal   Peds  Hematology negative hematology ROS (+)   Anesthesia Other Findings Only 2 teeth left, loose.  Reproductive/Obstetrics negative OB ROS                            Anesthesia Physical  Anesthesia Plan  ASA: IV  Anesthesia Plan: MAC   Post-op Pain Management:    Induction:   Airway Management Planned: Simple Face Mask  Additional Equipment: Arterial line  Intra-op Plan:   Post-operative Plan: Extubation in OR and Possible Post-op intubation/ventilation  Informed Consent: I have reviewed the patients History and Physical, chart, labs and discussed the procedure including the risks, benefits and alternatives for the proposed anesthesia with the patient or authorized representative who has indicated his/her understanding and acceptance.   Dental advisory given  Plan Discussed with: CRNA  Anesthesia Plan Comments: (1. A/C Systolic Heart Failure ICM. RV mildly dilated. ECHO 03/24/2016 EF 25-30%.  St Jude ICD Todays CO-OX was 58.5 %. Continue milrinone 0.125 mcg. Volume status elevated. Continue 80 mg IV lasix x2 and give 5 mg metolazone. Renal function stable.   No bb.  2. CAD: RCA.  - LHC (1/18): Diffuse up to 80% calcified stenosis throughout the proximal LAD. Diffuse disease in distal LAD up to 50%. Large OM1 totally occluded at the ostium but reconstituted via collaterals. 80% distal RCA stenosis involving the ostia of the PLV and 1. A/C Systolic Heart Failure ICM. RV mildly dilated. ECHO 03/24/2016 EF 25-30%. St Jude ICD Todays CO-OX was 58.5 %. milrinone 0.125 mcg. . Renal function stable.    2. CAD: RCA.  - LHC (1/18): Diffuse up to 80% calcified stenosis throughout the proximal LAD. Diffuse disease in distal LAD up to 50%. Large OM1 totally occluded at the ostium but reconstituted via collaterals. 80% distal RCA stenosis involving the ostia of the PLV and PDA with about 70% stenosis. Patient had PCI with DES to proximal LAD and PTCA OM1 (unable to place stent).  No CP. Continue statin and aspirin. Off plavix for pleurex catheter today.   3. PVCs- K 3.9 Mag 2.3  4. Hyponatremia- Sodium 130 5. AKI on CKD- on admit creatinine 1.9. Creatinine trending down on milrinone to 0.88.  6. DMII 7.  R Pleural Effusion- On CXR R moderate pleural effusion. He has had 2  thoracentesis 01/03/2016 and 01/05/2016. Off plavix. Planning for pleurex catheter today.)      Anesthesia Quick Evaluation

## 2016-04-07 NOTE — Transfer of Care (Signed)
Immediate Anesthesia Transfer of Care Note  Patient: [name]  Procedure(s) Performed: Procedure(s): INSERTION PLEURAL DRAINAGE CATHETER (Right)  Patient Location: PACU  Anesthesia Type:MAC  Level of Consciousness: awake, alert , oriented and patient cooperative  Airway & Oxygen Therapy: Patient Spontanous Breathing  Post-op Assessment: Report given to RN and Post -op Vital signs reviewed and stable  Post vital signs: Reviewed and stable  Last Vitals:  Vitals:   04/07/16 1400 04/07/16 1500  BP: 119/71 110/78  Pulse: 74 77  Resp: 13 14  Temp:      Last Pain:  Vitals:   04/07/16 1233  TempSrc: Oral  PainSc:       Patients Stated Pain Goal: 0 (28/76/81 1572)  Complications: No apparent anesthesia complications

## 2016-04-07 NOTE — Progress Notes (Signed)
Advanced Heart Failure Rounding Note   Subjective:    Admitted with low output heart failure, volume overload, and worsening renal function. PICC placed. CXR with R pleural effusion.   Initial CO-OX 63% but later dropped to 42%. Milrinone was started at 0.125 mcg. Diuresed with IV lasix. Negative 1 liters. Weight unchanged.   Denies SOB/Orthopnea. No CP. Co-ox 58% today. Creatinine improved.    Objective:   Weight Range:  Vital Signs:   Temp:  [97.6 F (36.4 C)-98.2 F (36.8 C)] 97.6 F (36.4 C) (04/06 0352) Pulse Rate:  [73-85] 73 (04/06 0700) Resp:  [8-23] 14 (04/06 0700) BP: (86-126)/(48-99) 114/80 (04/06 0700) SpO2:  [88 %-100 %] 98 % (04/06 0700) Weight:  [223 lb 12.8 oz (101.5 kg)] 223 lb 12.8 oz (101.5 kg) (04/06 0600) Last BM Date: 04/07/16  Weight change: Filed Weights   04/05/16 1750 04/06/16 0630 04/07/16 0600  Weight: 223 lb 8.7 oz (101.4 kg) 223 lb (101.2 kg) 223 lb 12.8 oz (101.5 kg)    Intake/Output:   Intake/Output Summary (Last 24 hours) at 04/07/16 0729 Last data filed at 04/07/16 0700  Gross per 24 hour  Intake          1678.57 ml  Output             2775 ml  Net         -1096.43 ml     Physical Exam: CVP 14 General:  Chronically ill appearing. NAD. In bed.   HEENT: normal anicteric Neck: Supple.. JVP to jaw. Carotids 2+ bilat; no bruits. No lymphadenopathy or thryomegaly appreciated. Cor: PMI nondisplaced. RRR. No rubs or murmurs. +S3. Lungs: Clear except decreased on the R.   Abdomen: soft, NT, ND, + BS.  No hepatosplenomegaly. No bruits or masses.  Extremities: no cyanosis, clubbing, rash, R and LLE trace-1+ edema. RUE PICC.   Neuro: A&Ox3. MAE x4. Affect pleasant.  Skin: R and LLE chronic hyperpigmentation.   Telemetry: NSR 70s personally reviewed.   Labs: Basic Metabolic Panel:  Recent Labs Lab 04/05/16 0900 04/05/16 2038 04/06/16 0404 04/06/16 0756 04/07/16 0420  NA 128*  --  128*  --  130*  K 3.0*  --  3.5  --  3.9    CL 84*  --  87*  --  90*  CO2 30  --  33*  --  32  GLUCOSE 191*  --  280*  --  194*  BUN 60*  --  55*  --  33*  CREATININE 1.93* 1.56* 1.33*  --  0.88  CALCIUM 8.8*  --  8.2*  --  8.1*  MG  --   --   --  2.3  --     Liver Function Tests: No results for input(s): AST, ALT, ALKPHOS, BILITOT, PROT, ALBUMIN in the last 168 hours. No results for input(s): LIPASE, AMYLASE in the last 168 hours. No results for input(s): AMMONIA in the last 168 hours.  CBC:  Recent Labs Lab 04/05/16 0900 04/05/16 2038  WBC 8.0 7.0  HGB 11.8* 11.6*  HCT 35.2* 34.0*  MCV 85.9 86.1  PLT 136* 118*    Cardiac Enzymes:  Recent Labs Lab 04/05/16 1107  TROPONINI 0.06*    BNP: BNP (last 3 results)  Recent Labs  02/08/16 0946 03/10/16 1048 04/05/16 1221  BNP 321.5* 487.7* 444.6*    ProBNP (last 3 results) No results for input(s): PROBNP in the last 8760 hours.    Other results:  Imaging: Dg Chest  2 View  Result Date: 04/05/2016 CLINICAL DATA:  In weakness for several days. Hypotension. No chest pain or shortness of breath. History of chronic CHF, ischemic cardiomyopathy. EXAM: CHEST  2 VIEW COMPARISON:  PA and lateral chest x-ray of March 24, 2016 FINDINGS: There is a persistent moderate-sized right pleural effusion resulting in volume loss of the right lung. This is slightly smaller than on the previous study. The left lung is well-expanded. The cardiac silhouette is mildly enlarged. The pulmonary vascularity either is mildly engorged with cephalization. The interstitial markings are coarse though stable. The ICD is in stable position. There is calcification in the wall of the aortic arch. The bony thorax exhibits no acute abnormality. IMPRESSION: Slight interval decrease in the moderate size right pleural effusion since the previous study. There is low-grade CHF which appears stable. Thoracic aortic atherosclerosis. Electronically Signed   By: David  [place] M.D.   On: 04/05/2016 12:21      Medications:     Scheduled Medications: . aspirin EC  81 mg Oral Daily  . atorvastatin  80 mg Oral QODAY  . Chlorhexidine Gluconate Cloth  6 each Topical Daily  . digoxin  0.125 mg Oral Daily  . ezetimibe  10 mg Oral Daily  . furosemide  80 mg Intravenous BID  . insulin aspart  0-15 Units Subcutaneous TID WC  . insulin aspart  0-5 Units Subcutaneous QHS  . insulin glargine  10 Units Subcutaneous Daily  . mouth rinse  15 mL Mouth Rinse BID  . potassium chloride  40 mEq Oral BID  . sodium chloride flush  10-40 mL Intracatheter Q12H  . sodium chloride flush  3 mL Intravenous Q12H    Infusions: . milrinone 0.125 mcg/kg/min (04/06/16 2000)    PRN Medications: sodium chloride, acetaminophen, ondansetron (ZOFRAN) IV, sodium chloride flush, sodium chloride flush   Assessment/Plan/Discussion    1. A/C Systolic Heart Failure ICM. RV mildly dilated. ECHO 03/24/2016 EF 25-30%. St Jude ICD Todays CO-OX was 58.5 %. Continue milrinone 0.125 mcg. Volume status elevated. Continue 80 mg IV lasix x2 and give 5 mg metolazone. Renal function stable.   No bb. No Arb with hypotension.  2. CAD: RCA.  - LHC (1/18): Diffuse up to 80% calcified stenosis throughout the proximal LAD. Diffuse disease in distal LAD up to 50%. Large OM1 totally occluded at the ostium but reconstituted via collaterals. 80% distal RCA stenosis involving the ostia of the PLV and PDA with about 70% stenosis. Patient had PCI with DES to proximal LAD and PTCA OM1 (unable to place stent).  No CP. Continue statin and aspirin. Off plavix for pleurex catheter today.   3. PVCs- K 3.9 Mag 2.3  4. Hyponatremia- Sodium 130. Watch closely.  5. AKI on CKD- on admit creatinine 1.9. Creatinine trending down on milrinone to 0.88.  BMET in the am.   6. DMII- Glucose better controlled. Continue sliding scale.  7.  R Pleural Effusion- On CXR R moderate pleural effusion. He has had 2  thoracentesis 01/03/2016 and 01/05/2016. Off plavix.  Planning for pleurex catheter today.  Could transfer to stepdown tomorrow,   Length of Stay: 2  Amy Clegg NP-C  04/07/2016, 7:29 AM  Advanced Heart Failure Team Pager [phone] (M-F; 7a - 4p)  Please contact [facility] for night-coverage after hours (4p -7a ) and weekends on [email or web address]  Patient seen and examined with [name], NP. We discussed all aspects of the encounter. I agree with the assessment and plan as stated above.  Remains tenuous despite low-dose milrinone. Co-ox 58%. May need to titrate. Will almost certainly need home inotropes.   Remains volume overloaded. Continue IV lasix and give metolazone.   Renal function improving with inotropic support.   Remains hyponatremic.  Has prominent, recurrent pleural effusion. For Pleurex catheter today. D/w Dr. Donata Clay.   Case d/w Dr. Shirlee Latch who agrees with home inotropic support and ongoing evaluation for advanced therapies. Will need to arrange with [facility]>  Arvilla Meres, MD  3:29 PM

## 2016-04-07 NOTE — Progress Notes (Signed)
The patient was examined and preop studies reviewed. There has been no change from the prior exam and the patient is ready for surgery.   Plan Right Pleurx catheter on L Sambrano for recurrent pleural effusion

## 2016-04-07 NOTE — Brief Op Note (Signed)
04/05/2016 - 04/07/2016  5:10 PM  PATIENT:  [name]  61 y.o. male  PRE-OPERATIVE DIAGNOSIS:  RIGHT PLEURAL EFFUSION  POST-OPERATIVE DIAGNOSIS:  RIGHT PLEURAL EFFUSION  PROCEDURE:  Procedure(s): INSERTION PLEURAL DRAINAGE CATHETER (Right) Drainage 2 L R pleural fluid SURGEON:  Surgeon(s) and Role:    * Kerin Perna, MD - Primary  PHYSICIAN ASSISTANT:   ASSISTANTS: none   ANESTHESIA:   local  EBL:  Total I/O In: 326 [I.V.:326] Out: 3856 [Urine:1850; Other:2000; Stool:1; Blood:5]  BLOOD ADMINISTERED:none  DRAINS: none   LOCAL MEDICATIONS USED:  LIDOCAINE  and Amount: 10 ml  SPECIMEN:  No Specimen  DISPOSITION OF SPECIMEN:  N/A  COUNTS:  YES  TOURNIQUET:  * No tourniquets in log *  DICTATION: .Dragon Dictation  PLAN OF CARE: return to ICU 2 H  PATIENT DISPOSITION:  return to ICU 2 H   Delay start of Pharmacological VTE agent (>24hrs) due to surgical blood loss or risk of bleeding: yes

## 2016-04-08 ENCOUNTER — Encounter (HOSPITAL_COMMUNITY): Payer: Self-pay | Admitting: Cardiothoracic Surgery

## 2016-04-08 DIAGNOSIS — R570 Cardiogenic shock: Secondary | ICD-10-CM

## 2016-04-08 LAB — BASIC METABOLIC PANEL
Anion gap: 12 (ref 5–15)
BUN: 23 mg/dL — ABNORMAL HIGH (ref 6–20)
CO2: 30 mmol/L (ref 22–32)
Calcium: 8.9 mg/dL (ref 8.9–10.3)
Chloride: 90 mmol/L — ABNORMAL LOW (ref 101–111)
Creatinine, Ser: 0.91 mg/dL (ref 0.61–1.24)
GFR calc Af Amer: >60 mL/min (ref >60)
GFR calc non Af Amer: >60 mL/min (ref >60)
Glucose, Bld: 314 mg/dL — ABNORMAL HIGH (ref 65–99)
Potassium: 4.2 mmol/L (ref 3.5–5.1)
Sodium: 132 mmol/L — ABNORMAL LOW (ref 135–145)

## 2016-04-08 LAB — COOXEMETRY PANEL
Carboxyhemoglobin: 1.5 % (ref 0.5–1.5)
Methemoglobin: 0.9 % (ref 0.0–1.5)
O2 Saturation: 50.7 %
Total hemoglobin: 12.5 g/dL (ref 12.0–16.0)

## 2016-04-08 LAB — GLUCOSE, CAPILLARY
GLUCOSE-CAPILLARY: 237 mg/dL — AB (ref 65–99)
Glucose-Capillary: 330 mg/dL — ABNORMAL HIGH (ref 65–99)
Glucose-Capillary: 402 mg/dL — ABNORMAL HIGH (ref 65–99)
Glucose-Capillary: 252 mg/dL — ABNORMAL HIGH (ref 65–99)

## 2016-04-08 MED ORDER — CLOPIDOGREL BISULFATE 75 MG PO TABS
75.0000 mg | ORAL_TABLET | Freq: Every day | ORAL | Status: DC
  Administered 2016-04-08 – 2016-04-10 (×3): 75 mg via ORAL
  Filled 2016-04-08 (×3): qty 1

## 2016-04-08 MED ORDER — MILRINONE LACTATE IN DEXTROSE 20-5 MG/100ML-% IV SOLN
0.2500 ug/kg/min | INTRAVENOUS | Status: DC
  Administered 2016-04-08 – 2016-04-09 (×3): 0.25 ug/kg/min via INTRAVENOUS
  Filled 2016-04-08 (×4): qty 100

## 2016-04-08 MED ORDER — INSULIN GLARGINE 100 UNIT/ML ~~LOC~~ SOLN
15.0000 [IU] | Freq: Every day | SUBCUTANEOUS | Status: DC
  Administered 2016-04-09: 15 [IU] via SUBCUTANEOUS
  Filled 2016-04-08: qty 0.15

## 2016-04-08 NOTE — Anesthesia Postprocedure Evaluation (Addendum)
Anesthesia Post Note  Patient: [name]  Procedure(s) Performed: Procedure(s) (LRB): INSERTION PLEURAL DRAINAGE CATHETER (Right)  Patient location during evaluation: PACU Anesthesia Type: MAC Level of consciousness: awake, awake and alert and oriented Pain management: pain level controlled Vital Signs Assessment: post-procedure vital signs reviewed and stable Respiratory status: spontaneous breathing, nonlabored ventilation and respiratory function stable Cardiovascular status: blood pressure returned to baseline Anesthetic complications: no       Last Vitals:  Vitals:   04/07/16 2054 04/08/16 0428  BP: 110/67 115/77  Pulse: 83 77  Resp: 18 12  Temp: 36.8 C 37 C    Last Pain:  Vitals:   04/07/16 1849  TempSrc: Oral  PainSc:                  [name]

## 2016-04-08 NOTE — Progress Notes (Signed)
Advanced Heart Failure Rounding Note   Subjective:    Admitted with low output heart failure, volume overload, and worsening renal function. PICC placed. CXR with R pleural effusion.   Initial CO-OX 63% but later dropped to 42%.   Remains on milrinone 0.125. Co-ox 50% Feels ok. CVP 11. Underwent Pleurex placement for R effusion on 4/6 with 2L out.   Feels ok. Denies dyspnea. Fatigued. No orthopnea or PND.    Objective:   Weight Range:  Vital Signs:   Temp:  [97.9 F (36.6 C)-98.6 F (37 C)] 98.6 F (37 C) (04/07 0817) Pulse Rate:  [74-83] 81 (04/07 0817) Resp:  [5-24] 14 (04/07 0817) BP: (106-131)/(62-84) 106/66 (04/07 0817) SpO2:  [91 %-100 %] 96 % (04/07 0817) Weight:  [98 kg (216 lb)] 98 kg (216 lb) (04/07 0428) Last BM Date: 04/07/16  Weight change: Filed Weights   04/06/16 0630 04/07/16 0600 04/08/16 0428  Weight: 101.2 kg (223 lb) 101.5 kg (223 lb 12.8 oz) 98 kg (216 lb)    Intake/Output:   Intake/Output Summary (Last 24 hours) at 04/08/16 1025 Last data filed at 04/08/16 0940  Gross per 24 hour  Intake             1427 ml  Output             5581 ml  Net            -4154 ml     Physical Exam: CVP 11 General:  Sitting up on side of bed. NAD HEENT: normal icteric Neck: Supple. JVP to jaw. Carotids 2+ bilat; no bruits. No lymphadenopathy or thryomegaly appreciated. Cor: PMI nondisplaced. RRR. No rubs or murmurs. +s3 Lungs: Clear. Decreased R base No wheeze Abdomen: soft, NT, nondistended, good bowel sounds Extremities: no cyanosis, clubbing, rash, cool 1+ edema Neuro: A&Ox3. MAE x4. Affect pleasant Skin: R and LLE chronic hyperpigmentation.   Telemetry: NSR 70-80s Personally reviewed   Labs: Basic Metabolic Panel:  Recent Labs Lab 04/05/16 0900 04/05/16 2038 04/06/16 0404 04/06/16 0756 04/07/16 0420 04/08/16 0354  NA 128*  --  128*  --  130* 132*  K 3.0*  --  3.5  --  3.9 4.2  CL 84*  --  87*  --  90* 90*  CO2 30  --  33*  --  32 30    GLUCOSE 191*  --  280*  --  194* 314*  BUN 60*  --  55*  --  33* 23*  CREATININE 1.93* 1.56* 1.33*  --  0.88 0.91  CALCIUM 8.8*  --  8.2*  --  8.1* 8.9  MG  --   --   --  2.3  --   --     Liver Function Tests: No results for input(s): AST, ALT, ALKPHOS, BILITOT, PROT, ALBUMIN in the last 168 hours. No results for input(s): LIPASE, AMYLASE in the last 168 hours. No results for input(s): AMMONIA in the last 168 hours.  CBC:  Recent Labs Lab 04/05/16 0900 04/05/16 2038  WBC 8.0 7.0  HGB 11.8* 11.6*  HCT 35.2* 34.0*  MCV 85.9 86.1  PLT 136* 118*    Cardiac Enzymes:  Recent Labs Lab 04/05/16 1107  TROPONINI 0.06*    BNP: BNP (last 3 results)  Recent Labs  02/08/16 0946 03/10/16 1048 04/05/16 1221  BNP 321.5* 487.7* 444.6*    ProBNP (last 3 results) No results for input(s): PROBNP in the last 8760 hours.    Other results:  Imaging: Dg Chest Portable 1 View  Result Date: 04/07/2016 CLINICAL DATA:  61 year old male with a history of PleurX catheter for pleural effusion EXAM: PORTABLE CHEST 1 VIEW COMPARISON:  04/05/2016 FINDINGS: Cardiomediastinal silhouette unchanged. Calcifications of the aortic arch. Improved opacity at the right base, with continued blunting of the right costophrenic sulcus and cardiophrenic sulcus. Partial obscuration of the right heart border and of the right hemidiaphragm. Mixed interstitial and airspace opacities of the bilateral lungs, most pronounced on the right. No left-sided pleural effusion. Interval placement of right-sided tunnel thoracostomy tube, with the tip terminating towards the apex on the right. No visualized pneumothorax. Unchanged left AICD. Interval placement of right upper extremity PICC. IMPRESSION: Interval placement of right tunneled thoracostomy tube which terminates directed towards the right apex. No visualized pneumothorax. Interval improvement in right-sided pleural effusion, with small volume persisting fluid and  associated atelectasis. Evidence of persisting edema and/or consolidation. Interval placement of right upper extremity PICC. Unchanged left chest wall AICD. Aortic atherosclerosis. Electronically Signed   By: Gilmer Mor D.O.   On: 04/07/2016 17:04   Dg C-arm 1-60 Min-no Report  Result Date: 04/07/2016 Fluoroscopy was utilized by the requesting physician.  No radiographic interpretation.     Medications:     Scheduled Medications: . aspirin EC  81 mg Oral Daily  . atorvastatin  80 mg Oral QODAY  . Chlorhexidine Gluconate Cloth  6 each Topical Daily  . digoxin  0.125 mg Oral Daily  . ezetimibe  10 mg Oral Daily  . furosemide  80 mg Intravenous BID  . insulin aspart  0-15 Units Subcutaneous TID WC  . insulin aspart  0-5 Units Subcutaneous QHS  . insulin glargine  10 Units Subcutaneous Daily  . mouth rinse  15 mL Mouth Rinse BID  . potassium chloride  40 mEq Oral BID  . sodium chloride flush  10-40 mL Intracatheter Q12H  . sodium chloride flush  3 mL Intravenous Q12H    Infusions: . lactated ringers 10 mL/hr at 04/07/16 1543  . milrinone 0.125 mcg/kg/min (04/07/16 1800)    PRN Medications: sodium chloride, acetaminophen, HYDROcodone-acetaminophen, ondansetron (ZOFRAN) IV, sodium chloride flush, sodium chloride flush   Assessment/Plan/Discussion    1. A/C Systolic Heart Failure -ICM. RV mildly dilated. ECHO 03/24/2016 EF 25-30%. St Jude ICD -Todays CO-OX back down on milrinone 0.125. Will increase to 0.25 -Long talk about need for home inotropes and VAD work-up. 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 Continue 80 mg IV lasix x2 and give 5 mg metolazone. Renal function stable.  - No bb or  Arb with hypotension.  2. CAD: RCA.  - LHC (1/18): Diffuse up to 80% calcified stenosis throughout the proximal LAD. Diffuse disease in distal LAD up to 50%. Large OM1 totally occluded at the ostium but reconstituted via collaterals. 80% distal RCA stenosis involving the ostia of the PLV and PDA with about 70% stenosis. Patient had PCI with DES to proximal LAD and PTCA OM1 (unable to place stent).  --No angina. Continue statin and aspirin. Resume Plavix now that Pleurex in 3. PVCs - K 4.2 Mag ok yesterday 4. Hyponatremia- Sodium 132. Watch closely.  5. AKI on CKD - on admit  creatinine 1.9. Creatinine trending down on milrinone to 0.9  BMET in the am.   6. DMII -sugars are high, will adjust insulin. Continue SSI 7.  R Pleural Effusion - s/p Pleurex 4/6.   Length of Stay: 3  Lianne Carreto MD 04/08/2016, 10:25 AM  Advanced Heart Failure Team Pager [phone] (M-F; 7a - 4p)  Please contact [facility] for night-coverage after hours (4p -7a ) and weekends on [email or web address]

## 2016-04-08 NOTE — Progress Notes (Addendum)
      [address]       [place] [address]             [phone]       1 Day Post-Op Procedure(s) (LRB): INSERTION PLEURAL DRAINAGE CATHETER (Right)  Subjective: Patient states he is amazed that 2 liters of fluid was taken out of him yesterday. He does have pain at catheter site.  Objective: Vital signs in last 24 hours: Temp:  [97.9 F (36.6 C)-98.6 F (37 C)] 98.6 F (37 C) (04/07 0817) Pulse Rate:  [74-83] 81 (04/07 0817) Cardiac Rhythm: Normal sinus rhythm;Bundle branch block (04/07 0700) Resp:  [5-24] 14 (04/07 0817) BP: (106-131)/(62-84) 106/66 (04/07 0817) SpO2:  [91 %-100 %] 96 % (04/07 0817) Weight:  [98 kg (216 lb)] 98 kg (216 lb) (04/07 0428)   Hemodynamic parameters for last 24 hours: CVP:  [13 mmHg] 13 mmHg  Intake/Output from previous day: 04/06 0701 - 04/07 0700 In: 1197.8 [P.O.:720; I.V.:464.8] Out: 5331 [Urine:3325; Stool:1; Blood:5]   Physical Exam:  Cardiovascular: RRR Pulmonary: Mostly clear. Wound: Dressing is clean and dry.    Lab Results: CBC: Recent Labs  04/05/16 0900 04/05/16 2038  WBC 8.0 7.0  HGB 11.8* 11.6*  HCT 35.2* 34.0*  PLT 136* 118*   BMET:  Recent Labs  04/07/16 0420 04/08/16 0354  NA 130* 132*  K 3.9 4.2  CL 90* 90*  CO2 32 30  GLUCOSE 194* 314*  BUN 33* 23*  CREATININE 0.88 0.91  CALCIUM 8.1* 8.9    PT/INR: No results for input(s): LABPROT, INR in the last 72 hours. ABG:  INR: Will add last result for INR, ABG once components are confirmed Will add last 4 CBG results once components are confirmed  Assessment/Plan:  1.S/p placement of right Pleur X catheter. Catheter to be drained daily starting tomorrow (04/08).Follow up appointment to be arranged.  2.Management per Dr. Ashok Croon MPA-C 04/08/2016,8:45 AM

## 2016-04-08 NOTE — Discharge Instructions (Signed)
Daily Pleurix drainage to be done daily: If <150 ml /drainage session x3 consecutive occasions - QOD drainage If <150 ml /drainage session x3 consecutive occasions on  QOD drainage schedule- call for office evaluation and possible removal

## 2016-04-09 ENCOUNTER — Inpatient Hospital Stay (HOSPITAL_COMMUNITY): Payer: BLUE CROSS/BLUE SHIELD

## 2016-04-09 DIAGNOSIS — I509 Heart failure, unspecified: Secondary | ICD-10-CM

## 2016-04-09 LAB — BASIC METABOLIC PANEL
Anion gap: 7 (ref 5–15)
BUN: 20 mg/dL (ref 6–20)
CO2: 32 mmol/L (ref 22–32)
Calcium: 8.4 mg/dL — ABNORMAL LOW (ref 8.9–10.3)
Chloride: 92 mmol/L — ABNORMAL LOW (ref 101–111)
Creatinine, Ser: 0.88 mg/dL (ref 0.61–1.24)
GFR calc Af Amer: >60 mL/min (ref >60)
GFR calc non Af Amer: >60 mL/min (ref >60)
Glucose, Bld: 261 mg/dL — ABNORMAL HIGH (ref 65–99)
Potassium: 3.5 mmol/L (ref 3.5–5.1)
Sodium: 131 mmol/L — ABNORMAL LOW (ref 135–145)

## 2016-04-09 LAB — GLUCOSE, CAPILLARY
GLUCOSE-CAPILLARY: 288 mg/dL — AB (ref 65–99)
Glucose-Capillary: 373 mg/dL — ABNORMAL HIGH (ref 65–99)
Glucose-Capillary: 267 mg/dL — ABNORMAL HIGH (ref 65–99)
Glucose-Capillary: 263 mg/dL — ABNORMAL HIGH (ref 65–99)

## 2016-04-09 LAB — COOXEMETRY PANEL
Carboxyhemoglobin: 2.5 % — ABNORMAL HIGH (ref 0.5–1.5)
Methemoglobin: 0.7 % (ref 0.0–1.5)
O2 Saturation: 76.4 %
Total hemoglobin: 11.4 g/dL — ABNORMAL LOW (ref 12.0–16.0)

## 2016-04-09 MED ORDER — INSULIN GLARGINE 100 UNIT/ML ~~LOC~~ SOLN
10.0000 [IU] | SUBCUTANEOUS | Status: AC
  Administered 2016-04-09: 10 [IU] via SUBCUTANEOUS
  Filled 2016-04-09: qty 0.1

## 2016-04-09 MED ORDER — METOLAZONE 5 MG PO TABS
5.0000 mg | ORAL_TABLET | Freq: Once | ORAL | Status: AC
  Administered 2016-04-09: 5 mg via ORAL
  Filled 2016-04-09: qty 1

## 2016-04-09 MED ORDER — INSULIN GLARGINE 100 UNIT/ML ~~LOC~~ SOLN
25.0000 [IU] | Freq: Every day | SUBCUTANEOUS | Status: DC
  Administered 2016-04-10: 25 [IU] via SUBCUTANEOUS
  Filled 2016-04-09: qty 0.25

## 2016-04-09 NOTE — Progress Notes (Addendum)
Advanced Heart Failure Rounding Note   Subjective:    Events --Admitted with low output heart failure, volume overload, and worsening renal function. PICC placed. CXR with R pleural effusion. Initial CO-OX 63% but later dropped to 42%.  --Underwent Pleurex placement for R effusion on 4/6 with 2L out.    Milrinone increased to 0.25 yesterday due to co-ox 50.7%. Now up to 76% Feels better. Less dyspneic. No orthopnea or PND. Out almost 4L on IV lasix but weight unchanged. CVP 11-> 9   Objective:   Weight Range:  Vital Signs:   Temp:  [98.3 F (36.8 C)-98.4 F (36.9 C)] 98.4 F (36.9 C) (04/08 0353) Pulse Rate:  [77-82] 81 (04/08 0836) Resp:  [16-18] 16 (04/08 0836) BP: (105-128)/(50-80) 128/78 (04/08 0836) SpO2:  [94 %-100 %] 100 % (04/08 0836) Weight:  [98 kg (216 lb 0.8 oz)] 98 kg (216 lb 0.8 oz) (04/08 0400) Last BM Date: 04/08/16  Weight change: Filed Weights   04/07/16 0600 04/08/16 0428 04/09/16 0400  Weight: 101.5 kg (223 lb 12.8 oz) 98 kg (216 lb) 98 kg (216 lb 0.8 oz)    Intake/Output:   Intake/Output Summary (Last 24 hours) at 04/09/16 1156 Last data filed at 04/09/16 1045  Gross per 24 hour  Intake           1128.4 ml  Output             3175 ml  Net          -2046.6 ml     Physical Exam: CVP 9 General:  Sitting up in chair  NAD HEENT: normal. anicteric Neck: Supple. JVP 9-10.  Cor: PMI laterally displaced. RRR. No rubs or murmurs. +s3 Lungs: Clear. Decreased R base. No wheeze  Abdomen: soft, NT, nondistended. Good BS  Extremities: no cyanosis, clubbing, rash, warm 1+ edema with chronic venous stasis changes  Neuro: A&Ox3. MAE x4. Affect pleasant Skin: R and LLE chronic hyperpigmentation.   Telemetry: NSR 70-80s Personally reviewed    Labs: Basic Metabolic Panel:  Recent Labs Lab 04/05/16 0900 04/05/16 2038 04/06/16 0404 04/06/16 0756 04/07/16 0420 04/08/16 0354 04/09/16 0420  NA 128*  --  128*  --  130* 132* 131*  K 3.0*  --   3.5  --  3.9 4.2 3.5  CL 84*  --  87*  --  90* 90* 92*  CO2 30  --  33*  --  32 30 32  GLUCOSE 191*  --  280*  --  194* 314* 261*  BUN 60*  --  55*  --  33* 23* 20  CREATININE 1.93* 1.56* 1.33*  --  0.88 0.91 0.88  CALCIUM 8.8*  --  8.2*  --  8.1* 8.9 8.4*  MG  --   --   --  2.3  --   --   --     Liver Function Tests: No results for input(s): AST, ALT, ALKPHOS, BILITOT, PROT, ALBUMIN in the last 168 hours. No results for input(s): LIPASE, AMYLASE in the last 168 hours. No results for input(s): AMMONIA in the last 168 hours.  CBC:  Recent Labs Lab 04/05/16 0900 04/05/16 2038  WBC 8.0 7.0  HGB 11.8* 11.6*  HCT 35.2* 34.0*  MCV 85.9 86.1  PLT 136* 118*    Cardiac Enzymes:  Recent Labs Lab 04/05/16 1107  TROPONINI 0.06*    BNP: BNP (last 3 results)  Recent Labs  02/08/16 0946 03/10/16 1048 04/05/16 1221  BNP 321.5*  487.7* 444.6*    ProBNP (last 3 results) No results for input(s): PROBNP in the last 8760 hours.    Other results:  Imaging: Dg Chest Port 1 View  Result Date: 04/09/2016 CLINICAL DATA:  Chest tube in place since 04/07/16. Hx of pacemaker, AICD, diabetes, CHF, HTN, CAD, cardiac catheterization. Smoker x40+/- years. EXAM: PORTABLE CHEST 1 VIEW COMPARISON:  Chest x-rays dated 04/07/2016 and 04/05/2016. FINDINGS: Right-sided chest tube tip is now seen at the level of the right mid lung, directed towards the right hilum. The right-sided pleural effusion appears stable in size, with probable adjacent atelectasis. Small pneumothorax has now appreciated at the right lung apex. Right-sided PICC line appears well positioned with tip at the level of the lower SVC/cavoatrial junction. Left lung remains clear. Cardiomegaly appears stable. Atherosclerotic changes noted at the aortic arch. IMPRESSION: 1. Small right apical pneumothorax. Recommend follow-up chest x-ray later today to ensure stability or resolution. 2. Right-sided chest tube in place with tip at the level  of the right mid lung. 3. Moderate-sized right pleural effusion appears stable, with probable adjacent atelectasis. 4. Cardiomegaly, stable. 5. Aortic atherosclerosis. These results will be called to the ordering clinician or representative by the Radiologist Assistant, and communication documented in the PACS or zVision Dashboard. Electronically Signed   By: Bary Richard M.D.   On: 04/09/2016 07:14   Dg Chest Portable 1 View  Result Date: 04/07/2016 CLINICAL DATA:  61 year old male with a history of PleurX catheter for pleural effusion EXAM: PORTABLE CHEST 1 VIEW COMPARISON:  04/05/2016 FINDINGS: Cardiomediastinal silhouette unchanged. Calcifications of the aortic arch. Improved opacity at the right base, with continued blunting of the right costophrenic sulcus and cardiophrenic sulcus. Partial obscuration of the right heart border and of the right hemidiaphragm. Mixed interstitial and airspace opacities of the bilateral lungs, most pronounced on the right. No left-sided pleural effusion. Interval placement of right-sided tunnel thoracostomy tube, with the tip terminating towards the apex on the right. No visualized pneumothorax. Unchanged left AICD. Interval placement of right upper extremity PICC. IMPRESSION: Interval placement of right tunneled thoracostomy tube which terminates directed towards the right apex. No visualized pneumothorax. Interval improvement in right-sided pleural effusion, with small volume persisting fluid and associated atelectasis. Evidence of persisting edema and/or consolidation. Interval placement of right upper extremity PICC. Unchanged left chest wall AICD. Aortic atherosclerosis. Electronically Signed   By: Gilmer Mor D.O.   On: 04/07/2016 17:04   Dg C-arm 1-60 Min-no Report  Result Date: 04/07/2016 Fluoroscopy was utilized by the requesting physician.  No radiographic interpretation.     Medications:     Scheduled Medications: . aspirin EC  81 mg Oral Daily  .  atorvastatin  80 mg Oral QODAY  . Chlorhexidine Gluconate Cloth  6 each Topical Daily  . clopidogrel  75 mg Oral Daily  . digoxin  0.125 mg Oral Daily  . ezetimibe  10 mg Oral Daily  . furosemide  80 mg Intravenous BID  . insulin aspart  0-15 Units Subcutaneous TID WC  . insulin aspart  0-5 Units Subcutaneous QHS  . insulin glargine  15 Units Subcutaneous Daily  . mouth rinse  15 mL Mouth Rinse BID  . potassium chloride  40 mEq Oral BID  . sodium chloride flush  10-40 mL Intracatheter Q12H  . sodium chloride flush  3 mL Intravenous Q12H    Infusions: . lactated ringers 10 mL/hr at 04/07/16 1543  . milrinone 0.25 mcg/kg/min (04/09/16 0343)    PRN Medications:  sodium chloride, acetaminophen, HYDROcodone-acetaminophen, ondansetron (ZOFRAN) IV, sodium chloride flush, sodium chloride flush   Assessment/Plan/Discussion    1. A/C Systolic Heart Failure -ICM. RV mildly dilated. ECHO 03/24/2016 EF 25-30%. St Jude ICD -Milrinone increased to 0.25 yesterday. Feeling better. Co-ox improved. Will continue at 0.25 - Volume status improving on IV lasix but CVP still up. Will continue IV lasix today and give another metolazone 5. Discussed fluid restriction. Renal function stable  -Long talk about need for home inotropes and VAD work-up yesterday. 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  2. CAD: RCA.  - LHC (1/18): Diffuse up to 80% calcified stenosis throughout the proximal LAD. Diffuse disease in distal LAD up to 50%. Large OM1 totally occluded at the ostium but reconstituted via collaterals. 80% distal RCA stenosis involving the ostia of the PLV and PDA with about 70% stenosis. Patient had PCI with DES to proximal LAD and PTCA OM1  (unable to place stent).  --No angina. Continue asa, statin and Plavix 3. PVCs - K 3.5. Will supp.  4. Hyponatremia- Sodium sable 131-132. Asymptomatic 5. AKI on CKD - due to cardiorenal syndrome  - on admit creatinine 1.9. Back down to 0.88 with inotrope support BMET in the am.   6. DMII -sugars remain high, will increase lantus from 15 to 25  7.  R Pleural Effusion - s/p Pleurex 4/6.  - CXR today (viewed personally) stil with mild to moderate R effusion. Will need to drain catheter and reasess.   He is going to live with his father in [place] after d/c so will have to talk to [facility] about how to coordinate.   Length of Stay: 4  Dalton Mcconathy MD 04/09/2016, 11:56 AM  Advanced Heart Failure Team Pager [phone] (M-F; 7a - 4p)  Please contact [facility] for night-coverage after hours (4p -7a ) and weekends on [email or web address]   Addendum  1L removed from pleural tube this afternoon.  Arvilla Meres, MD  2:17 PM

## 2016-04-09 NOTE — Progress Notes (Signed)
Drained right pleurx catheter. Took off 850 mL amber colored fluid. Pt stable. Will continue to monitor. Tera Helper E

## 2016-04-10 ENCOUNTER — Encounter (HOSPITAL_COMMUNITY): Payer: BLUE CROSS/BLUE SHIELD

## 2016-04-10 ENCOUNTER — Inpatient Hospital Stay (HOSPITAL_COMMUNITY): Payer: BLUE CROSS/BLUE SHIELD

## 2016-04-10 ENCOUNTER — Inpatient Hospital Stay (HOSPITAL_COMMUNITY): Admission: RE | Admit: 2016-04-10 | Payer: BLUE CROSS/BLUE SHIELD | Source: Ambulatory Visit

## 2016-04-10 LAB — ANTITHROMBIN III: AntiThromb III Func: 110 % (ref 75–120)

## 2016-04-10 LAB — BASIC METABOLIC PANEL
Anion gap: 11 (ref 5–15)
BUN: 17 mg/dL (ref 6–20)
CO2: 32 mmol/L (ref 22–32)
Calcium: 8.7 mg/dL — ABNORMAL LOW (ref 8.9–10.3)
Chloride: 89 mmol/L — ABNORMAL LOW (ref 101–111)
Creatinine, Ser: 0.8 mg/dL (ref 0.61–1.24)
GFR calc Af Amer: >60 mL/min (ref >60)
GFR calc non Af Amer: >60 mL/min (ref >60)
Glucose, Bld: 309 mg/dL — ABNORMAL HIGH (ref 65–99)
Potassium: 3.2 mmol/L — ABNORMAL LOW (ref 3.5–5.1)
Sodium: 132 mmol/L — ABNORMAL LOW (ref 135–145)

## 2016-04-10 LAB — GLUCOSE, CAPILLARY
Glucose-Capillary: 361 mg/dL — ABNORMAL HIGH (ref 65–99)
Glucose-Capillary: 348 mg/dL — ABNORMAL HIGH (ref 65–99)
Glucose-Capillary: 185 mg/dL — ABNORMAL HIGH (ref 65–99)

## 2016-04-10 LAB — COOXEMETRY PANEL
Carboxyhemoglobin: 1.7 % — ABNORMAL HIGH (ref 0.5–1.5)
Methemoglobin: 1 % (ref 0.0–1.5)
O2 Saturation: 55.9 %
Total hemoglobin: 12.6 g/dL (ref 12.0–16.0)

## 2016-04-10 LAB — PREALBUMIN: PREALBUMIN: 18.4 mg/dL (ref 18–38)

## 2016-04-10 LAB — PSA: PSA: 1.83 ng/mL (ref 0.00–4.00)

## 2016-04-10 LAB — T4, FREE: FREE T4: 0.97 ng/dL (ref 0.61–1.12)

## 2016-04-10 LAB — TSH: TSH: 6.914 u[IU]/mL — AB (ref 0.350–4.500)

## 2016-04-10 LAB — PLATELET INHIBITION P2Y12: PLATELET FUNCTION P2Y12: 211 [PRU] (ref 194–418)

## 2016-04-10 MED ORDER — SPIRONOLACTONE 25 MG PO TABS: 12.5000 mg | ORAL_TABLET | Freq: Every day | ORAL | 6 refills | Status: DC

## 2016-04-10 MED ORDER — TORSEMIDE 20 MG PO TABS
80.0000 mg | ORAL_TABLET | Freq: Two times a day (BID) | ORAL | Status: DC
  Administered 2016-04-10 (×2): 80 mg via ORAL
  Filled 2016-04-10 (×2): qty 4

## 2016-04-10 MED ORDER — POTASSIUM CHLORIDE CRYS ER 20 MEQ PO TBCR
40.0000 meq | EXTENDED_RELEASE_TABLET | Freq: Once | ORAL | Status: AC
  Administered 2016-04-10: 40 meq via ORAL
  Filled 2016-04-10: qty 2

## 2016-04-10 MED ORDER — MILRINONE LACTATE IN DEXTROSE 20-5 MG/100ML-% IV SOLN: 0.2500 ug/kg/min | INTRAVENOUS | 0 refills | Status: DC

## 2016-04-10 MED ORDER — TORSEMIDE 20 MG PO TABS: 80.0000 mg | ORAL_TABLET | Freq: Two times a day (BID) | ORAL | 6 refills | Status: DC

## 2016-04-10 MED ORDER — POTASSIUM CHLORIDE CRYS ER 20 MEQ PO TBCR: 20.0000 meq | EXTENDED_RELEASE_TABLET | Freq: Every day | ORAL | 6 refills | Status: DC

## 2016-04-10 MED ORDER — SPIRONOLACTONE 25 MG PO TABS
12.5000 mg | ORAL_TABLET | Freq: Every day | ORAL | Status: DC
  Administered 2016-04-10: 12.5 mg via ORAL
  Filled 2016-04-10: qty 1

## 2016-04-10 NOTE — Care Management Note (Addendum)
Case Management Note  Patient Details  Name: [name] MRN: [id number] Date of Birth: 1955/04/18  Subjective/Objective:     CHF, Ischemic Cardiomyopathy, CAD               Action/Plan: Discharge Planning: NCM spoke to pt at bedside. Lives at home with his father. States he still drives to his appts. Offered choice for [facility], pt requested [facility]. Pt scheduled dc home with IV Milrinone. [facility] Infusion Coordinator aware. Faxed completed forms to [facility] for pleurx drains. Provided pt with copy. Will mail original copies to [facility].   PCP Sigmund Hazel Md  Expected Discharge Date:  04/10/2016             Expected Discharge Plan:  Home w Home Health Services  In-House Referral:  NA  Discharge planning Services  CM Consult  Post Acute Care Choice:  Home Health Choice offered to:  Patient  DME Arranged:  N/A DME Agency:  NA  HH Arranged:  RN, Disease Management HH Agency:  [facility]  Status of Service:  Complete  If discussed at Long Length of Stay Meetings, dates discussed:    Additional Comments:  Elliot Cousin, RN 04/10/2016, 10:51 AM

## 2016-04-10 NOTE — Progress Notes (Signed)
Pt, D/C home with family member. Pt took all belongings (clothes, cell phone, glasses) with him. Discharchage instructions were given. Pt eager to go home.

## 2016-04-10 NOTE — Progress Notes (Signed)
Drain R Pleurx Catheter with no complications, output 1100cc. Flow of fluid did not slow down before container was full. Will continue to monitor pt.

## 2016-04-10 NOTE — Op Note (Signed)
NAME:  [name], HEBER NO.:  MEDICAL RECORD NO.:  [id number]  LOCATION:                                 FACILITY:  PHYSICIAN:  Ivin Poot, M.D.  DATE OF BIRTH:  1955/12/20  DATE OF PROCEDURE:  04/07/2016 DATE OF DISCHARGE:                              OPERATIVE REPORT   OPERATION:  Placement of right PleurX catheter.  SURGEON:  Ivin Poot, M.D.  PREOPERATIVE DIAGNOSIS:  Advanced heart failure, recurrent right pleural effusion.  POSTOPERATIVE DIAGNOSIS:  Advanced heart failure, recurrent right pleural effusion.  ANESTHESIA:  Local with 1% lidocaine with IV conscious anesthesia, monitored by Anesthesia team.  DESCRIPTION OF PROCEDURE:  After informed consent had been obtained and the proper site marked in the preop holding area, the patient was brought to the operating room and placed supine on the operating table. The right side was bumped up on a soft roll.  The right chest was prepped and draped as a sterile field.  A proper time-out was performed. Local anesthesia was infiltrated in the anterior axillary line at the fifth interspace as well as the right midclavicular line at the right costal margin.  Two small incisions were then made in these areas. Further lidocaine was infiltrated into the fifth interspace in the superior incision.  Using a needle with sheath, aspiration of right pleural effusion was made and a guidewire was passed into the right pleural space.  This was confirmed by C-arm fluoroscopy.  Next, a PleurX catheter was tunneled from the lower incision to the subcutaneous tissue to the upper incision and it was divided to the appropriate length.  Next, over the guidewire, a dilator sheath system was inserted into the pleural space.  The dilator was removed and clear pleural fluid exited. Through the sheath, the PleurX catheter was inserted and fed into the pleural space posteriorly to the apex.  The sheath was removed  and the PleurX catheter was visualized under C-arm fluoroscopy and found to be in good position.  Next, 2 L of clear xanthochromic fluid was drained from the right pleural space to the PleurX catheter.  The PleurX catheter was then capped.  The upper incision was closed with interrupted 3-0 Vicryl for the subcutaneous layer and interrupted 3-0 nylon for the skin.  The lower incision at the catheter exit site was closed with a single 0 silk suture, which was used to secure the catheter to the skin.  A sterile dressing in the PleurX kit was then applied to the incisions and covered with a sterile PleurX sheath.  The patient had a chest x-ray in the operating room, which showed good position of the catheter, significant improvement in the right pleural effusion, and no pneumothorax.  He then returned to the recovery room for further monitoring.     Ivin Poot, M.D.     PV/MEDQ  D:  04/08/2016  T:  04/08/2016  Job:  [id number]  cc:   Shaune Pascal. Bensimhon, MD

## 2016-04-10 NOTE — Progress Notes (Signed)
Inpatient Diabetes Program Recommendations  AACE/ADA: New Consensus Statement on Inpatient Glycemic Control (2015)  Target Ranges:  Prepandial:   less than 140 mg/dL      Peak postprandial:   less than 180 mg/dL (1-2 hours)      Critically ill patients:  140 - 180 mg/dL  Results for ZOLTAN, GENEST (MRN [id number]) as of 04/10/2016 15:13  Ref. Range 04/09/2016 07:58 04/09/2016 11:13 04/09/2016 16:53 04/09/2016 21:38 04/10/2016 07:48 04/10/2016 11:13  Glucose-Capillary Latest Ref Range: 65 - 99 mg/dL 409 (H) 811 (H) 914 (H) 263 (H) 361 (H) 348 (H)    Review of Glycemic Control  Current orders for Inpatient glycemic control: Lantus 25 units daily, Novolog 0-15 units TID with meals, Novolog 0-5 units QHS  Inpatient Diabetes Program Recommendations: Insulin - Basal: Please consider increasing Lantus to 35 units daily. Insulin - Meal Coverage: Please consider ordering Novolog 6 units TID with meals for meal coverage.  Thanks, Orlando Penner, RN, MSN, CDE Diabetes Coordinator Inpatient Diabetes Program [phone] (Team Pager from 8am to 5pm)

## 2016-04-10 NOTE — Progress Notes (Signed)
Patient ID: [name], male   DOB: 1955-10-20, 61 y.o.   MRN: [id number]    Advanced Heart Failure Rounding Note   Subjective:    Events --Admitted with low output heart failure, volume overload, and worsening renal function. PICC placed. CXR with R pleural effusion. Initial CO-OX 63% but later dropped to 42%.  --Underwent Pleurex placement for R effusion on 4/6 with 2L out.   Milrinone increased to 0.25 mcg/kg/min due to co-ox 50.7%.  Was 76% yesterday, but 56% this morning.  Overall feels better. Less dyspneic. No orthopnea or PND. Good UOP yesterday, weight down 5 lbs.  CVP 6-8.    Objective:   Weight Range:  Vital Signs:   Temp:  [98 F (36.7 C)-98.5 F (36.9 C)] 98 F (36.7 C) (04/09 0435) Pulse Rate:  [78-81] 78 (04/09 0435) Resp:  [12-16] 14 (04/09 0435) BP: (123-128)/(66-78) 123/66 (04/09 0435) SpO2:  [96 %-100 %] 98 % (04/09 0435) Weight:  [211 lb 9.6 oz (96 kg)] 211 lb 9.6 oz (96 kg) (04/09 0435) Last BM Date: 04/09/16  Weight change: Filed Weights   04/08/16 0428 04/09/16 0400 04/10/16 0435  Weight: 216 lb (98 kg) 216 lb 0.8 oz (98 kg) 211 lb 9.6 oz (96 kg)    Intake/Output:   Intake/Output Summary (Last 24 hours) at 04/10/16 0718 Last data filed at 04/10/16 0436  Gross per 24 hour  Intake              973 ml  Output             4725 ml  Net            -3752 ml     Physical Exam: CVP 6-8 General:  NAD HEENT: normal. anicteric Neck: Supple. JVP 8.  Cor: PMI laterally displaced. RRR. No rubs or murmurs. +s3 Lungs: Clear. Decreased R base. No wheeze  Abdomen: soft, NT, nondistended. Good BS  Extremities: no cyanosis, clubbing, rash, warm.  No edema.   Neuro: A&Ox3. MAE x4. Affect pleasant Skin: R and LLE chronic hyperpigmentation.   Telemetry: NSR 70-80s with PVCs Personally reviewed    Labs: Basic Metabolic Panel:  Recent Labs Lab 04/06/16 0404 04/06/16 0756 04/07/16 0420 04/08/16 0354 04/09/16 0420 04/10/16 0500  NA 128*  --  130*  132* 131* 132*  K 3.5  --  3.9 4.2 3.5 3.2*  CL 87*  --  90* 90* 92* 89*  CO2 33*  --  32 30 32 32  GLUCOSE 280*  --  194* 314* 261* 309*  BUN 55*  --  33* 23* 20 17  CREATININE 1.33*  --  0.88 0.91 0.88 0.80  CALCIUM 8.2*  --  8.1* 8.9 8.4* 8.7*  MG  --  2.3  --   --   --   --     Liver Function Tests: No results for input(s): AST, ALT, ALKPHOS, BILITOT, PROT, ALBUMIN in the last 168 hours. No results for input(s): LIPASE, AMYLASE in the last 168 hours. No results for input(s): AMMONIA in the last 168 hours.  CBC:  Recent Labs Lab 04/05/16 0900 04/05/16 2038  WBC 8.0 7.0  HGB 11.8* 11.6*  HCT 35.2* 34.0*  MCV 85.9 86.1  PLT 136* 118*    Cardiac Enzymes:  Recent Labs Lab 04/05/16 1107  TROPONINI 0.06*    BNP: BNP (last 3 results)  Recent Labs  02/08/16 0946 03/10/16 1048 04/05/16 1221  BNP 321.5* 487.7* 444.6*    ProBNP (last  3 results) No results for input(s): PROBNP in the last 8760 hours.    Other results:  Imaging: Dg Chest Port 1 View  Result Date: 04/09/2016 CLINICAL DATA:  Chest tube in place since 04/07/16. Hx of pacemaker, AICD, diabetes, CHF, HTN, CAD, cardiac catheterization. Smoker x40+/- years. EXAM: PORTABLE CHEST 1 VIEW COMPARISON:  Chest x-rays dated 04/07/2016 and 04/05/2016. FINDINGS: Right-sided chest tube tip is now seen at the level of the right mid lung, directed towards the right hilum. The right-sided pleural effusion appears stable in size, with probable adjacent atelectasis. Small pneumothorax has now appreciated at the right lung apex. Right-sided PICC line appears well positioned with tip at the level of the lower SVC/cavoatrial junction. Left lung remains clear. Cardiomegaly appears stable. Atherosclerotic changes noted at the aortic arch. IMPRESSION: 1. Small right apical pneumothorax. Recommend follow-up chest x-ray later today to ensure stability or resolution. 2. Right-sided chest tube in place with tip at the level of the right  mid lung. 3. Moderate-sized right pleural effusion appears stable, with probable adjacent atelectasis. 4. Cardiomegaly, stable. 5. Aortic atherosclerosis. These results will be called to the ordering clinician or representative by the Radiologist Assistant, and communication documented in the PACS or zVision Dashboard. Electronically Signed   By: Bary Richard M.D.   On: 04/09/2016 07:14     Medications:     Scheduled Medications: . aspirin EC  81 mg Oral Daily  . atorvastatin  80 mg Oral QODAY  . Chlorhexidine Gluconate Cloth  6 each Topical Daily  . clopidogrel  75 mg Oral Daily  . digoxin  0.125 mg Oral Daily  . ezetimibe  10 mg Oral Daily  . furosemide  80 mg Intravenous BID  . insulin aspart  0-15 Units Subcutaneous TID WC  . insulin aspart  0-5 Units Subcutaneous QHS  . insulin glargine  25 Units Subcutaneous Daily  . mouth rinse  15 mL Mouth Rinse BID  . potassium chloride  40 mEq Oral BID  . potassium chloride  40 mEq Oral Once  . sodium chloride flush  10-40 mL Intracatheter Q12H  . sodium chloride flush  3 mL Intravenous Q12H  . spironolactone  12.5 mg Oral Daily    Infusions: . lactated ringers 10 mL/hr at 04/07/16 1543  . milrinone 0.25 mcg/kg/min (04/09/16 1834)    PRN Medications: sodium chloride, acetaminophen, HYDROcodone-acetaminophen, ondansetron (ZOFRAN) IV, sodium chloride flush, sodium chloride flush   Assessment/Plan/Discussion    1. Acute on chronic systolic CHF: Ischemic cardiomyopathy. RV mildly dilated. ECHO 03/24/2016 EF 25%. St Jude ICD. Volume status looks improved, CVP 6-8.  Co-ox acceptable at 56%.  - I think that he will need to go home on milrinone gtt at 0.25.  Will arrange this for today.  He will be staying with his father in [place].  - Transition to torsemide 80 mg po bid.  - We discussed need to consider LVAD at this point.  He is open to placement of LVAD.  He has family help.  He is not a transplant candidate at this point with  ongoing smoking.  Will have our LVAD nurse see him today and start workup. Would aim for LVAD in near future.  - Switch PICC to single lumen tomorrow - Will consult SW - Continue digoxin, add spironolactone 12.5 daily.  No beta blocker with low output, holding off on ARB for now with soft BP.  2. CAD: RCA.  LHC (1/18): Diffuse up to 80% calcified stenosis throughout the proximal LAD.  Diffuse disease in distal LAD up to 50%. Large OM1 totally occluded at the ostium but reconstituted via collaterals. 80% distal RCA stenosis involving the ostia of the PLV and PDA with about 70% stenosis. Patient had PCI with DES to proximal LAD and PTCA OM1 (unable to place stent).  - No angina. Continue asa, statin and Plavix 3. PVCs: Replace K.   4. Hyponatremia: Sodium stable 132.  5. AKI: Due to cardiorenal syndrome.  Creatinine back to normal.  6. DMII: Insulin.  7.  R Pleural Effusion: s/p Pleur-X 4/6.  8. Disposition: Home today.  Will need home nursing to manage PleurX catheter and home milrinone.  He will be in [place] with his father.  Need to initiate LVAD workup, LVAD nurse to see today. Will need help getting on disability.  Meds for home: milrinone 0.25, Kdur 20 daily, torsemide 80 bid, spironolactone 12.5 daily, digoxin 0.125 daily, ASA 81, atorvastatin 80, Plavix 75, home diabetes regimen.   Length of Stay: 5  Marca Ancona MD 04/10/2016, 7:18 AM  Advanced Heart Failure Team Pager [phone] (M-F; 7a - 4p)  Please contact [facility] for night-coverage after hours (4p -7a ) and weekends on [email or web address]

## 2016-04-10 NOTE — Discharge Summary (Signed)
Advanced Heart Failure Discharge Note  Discharge Summary   Patient ID: [name] MRN: [id number], DOB/AGE: [birth year] 61 y.o. Admit date: 04/05/2016 D/C date:     04/10/2016   Primary Discharge Diagnoses:  1. Acute on chronic systolic CHF 2. Ischemic cardiomyopathy 3. CAD 4. PVCs 5. Hyponatremia 6. AKI 7. DM 2 8. R Pleural effusion s/p PleurX cath placement 04/07/16. 9. Hypokalemia  Hospital Course:   [name] is a 61 y.o. male admitted 04/05/16 with volume overload and AKI in setting of low output HF. PICC placed. CXR with R pleural effusion.  Initial mixed venous saturation 63% but then dropped to 42%.  Started on milrinone.  Milrinone increase to 0.25 mcg/kg/min with mixed venous stat marginal at ~50%. Oral medications titrated as tolerated, but beta blocker stopped with low output and ARB stopped with AKI. Added on low dose spiro with hypokalemia. With inability to wean milrinone due to marginal coox, decision for home inotrope use made.  Pt improved symptomatically and also showed improvement in renal function on milrinone. PICC exchanged for single lumen on 04/10/16 prior to d/c for home inotrope support.   Hospital course complicated by persistent R pleural effusion, for which PleurX cath placed 04/07/16 with 2 L out initially.   Overall pt out 14.7 L (including PleurX drainage) and down 12 lbs from admit. PleurX catheter with > 1 L drainage on day of discharge.   Consideration for VAD begun, so baseline labwork ordered. Please see chart for further.  Will discuss patient at next VAD MRB.   Pt will be discharged to home in stable but tenuous condition with PICC line in place for home milrinone and Pleurx cath. [facility] to follow for both. PleurX cath should be drained every other day to start.    [facility] to draw BMET/CBC/Mg 04/17/16 and fax to HF clinic.   Discharge Weight: 211 lbs Discharge Vitals: Blood pressure 125/72, pulse 80, temperature 98.5 F (36.9 C), temperature source Oral,  resp. rate 17, height 6' (1.829 m), weight 211 lb 9.6 oz (96 kg), SpO2 100 %.  Labs: Lab Results  Component Value Date   WBC 7.0 04/05/2016   HGB 11.6 (L) 04/05/2016   HCT 34.0 (L) 04/05/2016   MCV 86.1 04/05/2016   PLT 118 (L) 04/05/2016     Recent Labs Lab 04/10/16 0500  NA 132*  K 3.2*  CL 89*  CO2 32  BUN 17  CREATININE 0.80  CALCIUM 8.7*  GLUCOSE 309*   Lab Results  Component Value Date   CHOL 103 03/10/2016   HDL 49 03/10/2016   LDLCALC 45 03/10/2016   TRIG 43 03/10/2016   BNP (last 3 results)  Recent Labs  02/08/16 0946 03/10/16 1048 04/05/16 1221  BNP 321.5* 487.7* 444.6*    ProBNP (last 3 results) No results for input(s): PROBNP in the last 8760 hours.   Diagnostic Studies/Procedures   Dg Chest Port 1 View  Result Date: 04/09/2016 CLINICAL DATA:  Chest tube in place since 04/07/16. Hx of pacemaker, AICD, diabetes, CHF, HTN, CAD, cardiac catheterization. Smoker x40+/- years. EXAM: PORTABLE CHEST 1 VIEW COMPARISON:  Chest x-rays dated 04/07/2016 and 04/05/2016. FINDINGS: Right-sided chest tube tip is now seen at the level of the right mid lung, directed towards the right hilum. The right-sided pleural effusion appears stable in size, with probable adjacent atelectasis. Small pneumothorax has now appreciated at the right lung apex. Right-sided PICC line appears well positioned with tip at the level of the lower SVC/cavoatrial  junction. Left lung remains clear. Cardiomegaly appears stable. Atherosclerotic changes noted at the aortic arch. IMPRESSION: 1. Small right apical pneumothorax. Recommend follow-up chest x-ray later today to ensure stability or resolution. 2. Right-sided chest tube in place with tip at the level of the right mid lung. 3. Moderate-sized right pleural effusion appears stable, with probable adjacent atelectasis. 4. Cardiomegaly, stable. 5. Aortic atherosclerosis. These results will be called to the ordering clinician or representative by the  Radiologist Assistant, and communication documented in the PACS or zVision Dashboard. Electronically Signed   By: Stan  MayBary Richard  On: 04/09/2016 07:14    Discharge Medications   Allergies as of 04/10/2016      Reactions   Metformin And Related Nausea And Vomiting   *Only the extended release* (does this)      Medication List    STOP taking these medications   carvedilol 6.25 MG tablet Commonly known as:  COREG   losartan 25 MG tablet Commonly known as:  COZAAR   metolazone 2.5 MG tablet Commonly known as:  ZAROXOLYN     TAKE these medications   aspirin 81 MG tablet Take 1 tablet (81 mg total) by mouth daily.   atorvastatin 80 MG tablet Commonly known as:  LIPITOR Take 1 tablet (80 mg total) by mouth every other day.   clopidogrel 75 MG tablet Commonly known as:  PLAVIX Take 1 tablet (75 mg total) by mouth daily.   digoxin 0.125 MG tablet Commonly known as:  LANOXIN Take 1 tablet (0.125 mg total) by mouth daily.   ezetimibe 10 MG tablet Commonly known as:  ZETIA TAKE ONE TABLET BY MOUTH ONCE DAILY   insulin NPH-regular Human (70-30) 100 UNIT/ML injection Commonly known as:  NOVOLIN 70/30 Inject 16-32 Units into the skin 2 (two) times daily with a meal. 32 units in the morning, 16 units in the evening   milrinone 20 MG/100 ML Soln infusion Commonly known as:  PRIMACOR Inject 24.5 mcg/min into the vein continuous.   potassium chloride SA 20 MEQ tablet Commonly known as:  K-DUR,KLOR-CON Take 1 tablet (20 mEq total) by mouth daily. Take extra 2 meq (1 tablet) on Saturdays with metolazone. What changed:  when to take this   spironolactone 25 MG tablet Commonly known as:  ALDACTONE Take 0.5 tablets (12.5 mg total) by mouth daily. What changed:  how much to take   torsemide 20 MG tablet Commonly known as:  DEMADEX Take 4 tablets (80 mg total) by mouth 2 (two) times daily.            Durable Medical Equipment        Start     Ordered   04/10/16  1504  Heart failure home health orders  (Heart failure home health orders / Face to face)  Once    Comments:  Heart Failure Follow-up Care:  Verify follow-up appointments per Patient Discharge Instructions. Confirm transportation arranged. Reconcile home medications with discharge medication list. Remove discontinued medications from use. Assist patient/caregiver to manage medications using pill box. Reinforce low sodium food selection Assessments: Vital signs and oxygen saturation at each visit. Assess home environment for safety concerns, caregiver support and availability of low-sodium foods. Consult Child psychotherapist, PT/OT, Dietitian, and CNA based on assessments. Perform comprehensive cardiopulmonary assessment. Notify MD for any change in condition or weight gain of 3 pounds in one day or 5 pounds in one week with symptoms. Daily Weights and Symptom Monitoring: Ensure patient has access to scales. Teach  patient/caregiver to weigh daily before breakfast and after voiding using same scale and record.    Teach patient/caregiver to track weight and symptoms and when to notify Provider. Activity: Develop individualized activity plan with patient/caregiver.  [facility] to provide Milrinone 0.25 mcg/kg/min x 12 months. Labs every 2 weeks faxed to AHF clinic as above.   Please check BMET/CBC/Mg 04/17/16 and fax to HF clinic as above.   PleurX catheter care/drainage to be performed every OTHER day to start.  Question Answer Comment  Heart Failure Follow-up Care [facility] at [phone]   Obtain the following labs Basic Metabolic Panel   Lab frequency Other see comments   Fax lab results to AHF Clinic at [phone]   Diet Low Sodium Heart Healthy   Fluid restrictions: 2000 mL Fluid   Initiate Heart Failure Clinic Diuretic Protocol to be used by [facility] only ( to be ordered by Heart Failure Team Providers Only) Yes      04/10/16 1504      Disposition     The patient will be discharged in stable but tenuous condition to home. Discharge Instructions    (HEART FAILURE PATIENTS) Call MD:  Anytime you have any of the following symptoms: 1) 3 pound weight gain in 24 hours or 5 pounds in 1 week 2) shortness of breath, with or without a dry hacking cough 3) swelling in the hands, feet or stomach 4) if you have to sleep on extra pillows at night in order to breathe.    Complete by:  As directed    Call MD for:  redness, tenderness, or signs of infection (pain, swelling, redness, odor or green/yellow discharge around incision site)    Complete by:  As directed    Diet - low sodium heart healthy    Complete by:  As directed    Heart Failure patients record your daily weight using the same scale at the same time of day    Complete by:  As directed    Increase activity slowly    Complete by:  As directed      Follow-up Information    Kathlee Nations Trigt III, MD Follow up.   Specialty:  Cardiothoracic Surgery Why:  PA/LAT CXR to be taken (at [facility] which is in the same building as Dr. Zenaida Niece Trigt's office) one hour prior to office appointment;Office will mail or call with appointment date and time Contact information: [address] [place] [address] [phone]        [facility] Follow up.   Why:  Home Health RN Contact information: [address] [place] [address] [phone]        Marca Ancona, MD Follow up.   Specialty:  Cardiology Why:  at 1120 for post hospital follow up. The code for parking is 6000. Please bring all of your medications to your visit.  Contact information: [address] [place] [address] [phone]             Duration of Discharge Encounter: Greater than 35 minutes   Signed, Luane School 04/10/2016, 3:28 PM

## 2016-04-10 NOTE — Progress Notes (Signed)
Drain R Pleurx Catheter with no complications, output 350cc.

## 2016-04-11 ENCOUNTER — Encounter (HOSPITAL_COMMUNITY): Payer: Self-pay | Admitting: *Deleted

## 2016-04-11 DIAGNOSIS — I2510 Atherosclerotic heart disease of native coronary artery without angina pectoris: Secondary | ICD-10-CM | POA: Diagnosis not present

## 2016-04-11 NOTE — Progress Notes (Signed)
Initial Encounter with LVAD Team and MCS Introduction:  [name] is a 61 y.o. male whom  has a past medical history of AICD (automatic cardioverter/defibrillator) present; Barretts syndrome; Carotid artery disease (HCC); Cerebrovascular disease; Chronic systolic heart failure (HCC); Coronary artery disease; Diabetes mellitus; Heart murmur; History of kidney stones; HTN (hypertension); Hyperlipidemia; ICD (implantable cardiac defibrillator), dual, in situ; Ischemic cardiomyopathy; Pleural effusion (01/05/2016); Presence of permanent cardiac pacemaker; PVD (peripheral vascular disease) (HCC); RIATA ICD Lead--on advisory recall ( ); and Tobacco abuse.. We have been asked to evaluate the patient for advanced therapies which include Left Ventricular Assist Device implantation.   Lab Results  Component Value Date   ABORH A POS 11/06/2014   ABORH A POS 11/06/2014    Lab Results  Component Value Date   HGBA1C 7.6 (H) 01/05/2016   Lab Results  Component Value Date   CREATININE 0.80 04/10/2016   CREATININE 0.88 04/09/2016   CREATININE 0.91 04/08/2016    VAD educational packet including "HM II Patient Handbook", "HM II Left Ventricular Assist System" packet, and "[place] HM II Patient Education" reviewed in detail with me and left at bedside for continued reference. Patient education DVD was given to him as well for reference should he want to see more about the equipment at home after reviewing the information I left him.   Explained that LVAD can be implanted for two indications in the setting of advanced left ventricular heart failure treatment:  Bridge to transplant - used for patients who cannot safely wait for heart transplant without this device.  Or   Destination therapy - used for patients until end of life or recovery of heart function.  Discussed that at this point [name] would be considered for Destination therapy should he be deemed an acceptable VAD candidate.    Provided brief equipment overview of the HeartMate II pump and discussed placement, surgical procedure, peripheral equipment, life-long coumadin therapy, importance of medication adherence and clinic follow up for as long as patient is living on support, life-style modifications, as well as need for caregiver to be successful with this therapy.    We discussed the process of the evaluation period and how a decision will be made by the [facility] team whether he would be an appropriate candidate for therapy or not. Evaluation consent was reviewed and  signed to begin evaluation process.  Caregiver Support: Has one daughter and one son. Lives with Father.  Home Inspection Checklist: verified that patient has reliable telephone, running water and electricity in the home.   Advised the patient review the materials, contact either myself or Carlton Adam with questions and we will plan on meeting with her at next scheduled clinic appointment.   Follow-Up Plan: Next week in HF clinic. Plan to see Annice Pih CSW. Appointment made to see Cardiac surgeons.  Marcellus Scott RN, VAD Coordinator 24/7 pager [phone]

## 2016-04-12 ENCOUNTER — Encounter (HOSPITAL_COMMUNITY): Payer: BLUE CROSS/BLUE SHIELD

## 2016-04-12 ENCOUNTER — Ambulatory Visit (HOSPITAL_COMMUNITY): Payer: BLUE CROSS/BLUE SHIELD

## 2016-04-12 LAB — HEPATITIS C ANTIBODY: HCV Ab: <0.1 s/co ratio (ref 0.0–0.9)

## 2016-04-12 LAB — HEMOGLOBIN A1C
HEMOGLOBIN A1C: 8.9 % — AB (ref 4.8–5.6)
Mean Plasma Glucose: 209 mg/dL

## 2016-04-12 LAB — HEPATITIS B SURFACE ANTIGEN: Hepatitis B Surface Ag: NEGATIVE

## 2016-04-12 LAB — LUPUS ANTICOAGULANT PANEL
DRVVT: 42.8 s (ref 0.0–47.0)
PTT Lupus Anticoagulant: 34.4 s (ref 0.0–51.9)

## 2016-04-12 LAB — HEPATITIS B CORE ANTIBODY, TOTAL: HEP B C TOTAL AB: NEGATIVE

## 2016-04-12 LAB — PLATELET INHIBITION P2Y12: PLATELET FUNCTION P2Y12: 211 [PRU] (ref 194–418)

## 2016-04-12 LAB — HEPATITIS B SURFACE ANTIBODY, QUANTITATIVE

## 2016-04-14 ENCOUNTER — Telehealth (HOSPITAL_COMMUNITY): Payer: Self-pay | Admitting: Cardiac Rehabilitation

## 2016-04-14 ENCOUNTER — Encounter (HOSPITAL_COMMUNITY): Payer: BLUE CROSS/BLUE SHIELD

## 2016-04-14 ENCOUNTER — Ambulatory Visit (HOSPITAL_COMMUNITY): Payer: BLUE CROSS/BLUE SHIELD

## 2016-04-14 NOTE — Telephone Encounter (Signed)
pc to pt to discuss exiting from cardiac rehab outpatient program due to advanced CHF.  Pt verbalized understanding.

## 2016-04-17 ENCOUNTER — Encounter (HOSPITAL_COMMUNITY): Payer: BLUE CROSS/BLUE SHIELD

## 2016-04-17 LAB — FACTOR 5 LEIDEN

## 2016-04-17 NOTE — Telephone Encounter (Signed)
CSW left message for return call to schedule meet n greet for LVAD assessment. CSW will await return call. Lasandra Beech, LCSW, CCSW-MCS [phone]

## 2016-04-18 ENCOUNTER — Encounter: Payer: Self-pay | Admitting: Unknown Physician Specialty

## 2016-04-18 ENCOUNTER — Other Ambulatory Visit: Payer: Self-pay | Admitting: Unknown Physician Specialty

## 2016-04-18 ENCOUNTER — Encounter (HOSPITAL_COMMUNITY): Payer: Self-pay | Admitting: Unknown Physician Specialty

## 2016-04-18 ENCOUNTER — Encounter (HOSPITAL_COMMUNITY): Payer: Self-pay | Admitting: Dentistry

## 2016-04-18 ENCOUNTER — Ambulatory Visit (HOSPITAL_COMMUNITY): Payer: Self-pay | Admitting: Dentistry

## 2016-04-18 VITALS — BP 116/63 | HR 82 | Temp 97.7°F

## 2016-04-18 DIAGNOSIS — M264 Malocclusion, unspecified: Secondary | ICD-10-CM

## 2016-04-18 DIAGNOSIS — K08409 Partial loss of teeth, unspecified cause, unspecified class: Secondary | ICD-10-CM

## 2016-04-18 DIAGNOSIS — K045 Chronic apical periodontitis: Secondary | ICD-10-CM

## 2016-04-18 DIAGNOSIS — K0889 Other specified disorders of teeth and supporting structures: Secondary | ICD-10-CM

## 2016-04-18 DIAGNOSIS — K0820 Unspecified atrophy of edentulous alveolar ridge: Secondary | ICD-10-CM

## 2016-04-18 DIAGNOSIS — K0530 Chronic periodontitis, unspecified: Secondary | ICD-10-CM

## 2016-04-18 DIAGNOSIS — I255 Ischemic cardiomyopathy: Secondary | ICD-10-CM | POA: Diagnosis not present

## 2016-04-18 DIAGNOSIS — Z9189 Other specified personal risk factors, not elsewhere classified: Secondary | ICD-10-CM

## 2016-04-18 DIAGNOSIS — Z01818 Encounter for other preprocedural examination: Secondary | ICD-10-CM

## 2016-04-18 DIAGNOSIS — K083 Retained dental root: Secondary | ICD-10-CM

## 2016-04-18 DIAGNOSIS — K029 Dental caries, unspecified: Secondary | ICD-10-CM

## 2016-04-18 DIAGNOSIS — I5022 Chronic systolic (congestive) heart failure: Secondary | ICD-10-CM

## 2016-04-18 DIAGNOSIS — K036 Deposits [accretions] on teeth: Secondary | ICD-10-CM

## 2016-04-18 NOTE — Patient Instructions (Signed)
[name]    Department of Dental Medicine     DR. Kirti Carl      HEART DISEASE AND MOUTH CARE:  FACTS:   If you have any infection in your mouth, it can infect your heart.  If your heart is infected, you will be seriously ill.  Infections in the mouth can be SILENT and do not always cause pain.  Examples of infections in the mouth are gum disease, dental cavities, and abscesses.  Some possible signs of infection are: Bad breath, bleeding gums, or teeth that are sensitive to sweets, hot, and/or cold. There are many other signs as well.  WHAT YOU HAVE TO DO:   Brush your teeth after meals and at bedtime. Spend at least 2 minutes brushing well, especially behind your back teeth and all around your teeth that stand alone. Brush at the gumline also.  Do not go to bed without brushing your teeth and flossing.  If you gums bleed when you brush or floss, do NOT stop brushing or flossing. It usually means that your gums need more attention and better cleaning.   If your Dentist or Dr. Kristin Bruins gave you a prescription mouthwash to use, make sure to use it as directed. If you run out of the medication, get a refill at the pharmacy.   If you were given any other medications or directions by your Dentist, please follow them. If you did not understand the directions or forget what you were told, please call. We will be happy to refresh her memory.  If you need antibiotics before dental procedures, make sure you take them one hour prior to every dental visit as directed.   Get a dental checkup every 4-6 months in order to keep your mouth healthy, or to find and treat any new infection. You will most likely need your teeth cleaned or gums treated at the same time.  If you are not able to come in for your scheduled appointment, call your Dentist as soon as possible to reschedule.  If you have a problem in between dental visits, call your Dentist.

## 2016-04-18 NOTE — Progress Notes (Signed)
DENTAL CONSULTATION  Date of Consultation:  04/18/2016 Patient Name:   [name] Date of Birth:   1955-11-17 Medical Record Number: [id number]  VITALS: BP 116/63 (BP Location: Left Arm)   Pulse 82   Temp 97.7 F (36.5 C) (Oral)   CHIEF COMPLAINT: Patient referred by Dr. Shirlee Latch for a dental consultation.   HPI: [name] is a 61 year old male with ischemic cardiomyopathy and chronic systolic heart failure. Patient with anticipated placement of LVAD.  Patient is now seen as part of a pre-LVAD placement dental protocol examination to rule out dental infection that may affect the patient's systemic health and anticipated LVAD placement.  The patient currently denies acute toothaches, swellings, or abscesses. Patient has not seen a dentist for "a long time. " The patient was seen by dentist that referred him to an oral surgeon for extraction. This was approximately  5 years ago. Patient denies any complications from that dental extraction. Patient does not seek regular dental care. Patient has no partial dentures. Patient denies having dental phobia.   PROBLEM LIST: Patient Active Problem List   Diagnosis Date Noted  . Ischemic cardiomyopathy     Priority: High  . Low output heart failure (HCC) 04/05/2016  . Pleural effusion on right   . Diabetes mellitus with complication (HCC)   . Hypoglycemia 03/13/2015  . Chronic systolic CHF (congestive heart failure) (HCC) 03/13/2015  . Pressure ulcer 12/31/2014  . Pleural effusion 12/30/2014  . Hyperglycemia without ketosis   . Infected open wound 09/22/2014  . Type 2 diabetes mellitus (HCC) 09/22/2014  . Skin ulcer (HCC) 09/21/2014  . PAD (peripheral artery disease) (HCC) 11/07/2013  . PVD (peripheral vascular disease) (HCC)   . Pre-operative cardiovascular examination   . Metabolic alkalosis 12/07/2012  . Hypotension 10/01/2012  . Carotid stenosis 10/25/2011  . Hypertension 08/09/2010  . Automatic implantable  cardioverter-defibrillator in [employer]. Jude 08/09/2010  . Coronary atherosclerosis 01/21/2009  . TOBACCO ABUSE 05/15/2008  . Cerebrovascular disease 05/15/2008  . Peripheral vascular disease (HCC) 05/15/2008  . Hyperlipidemia 01/22/2008  . SYSTOLIC HEART FAILURE, CHRONIC 01/22/2008    PMH: Past Medical History:  Diagnosis Date  . AICD (automatic cardioverter/defibrillator) present   . Barretts syndrome   . Carotid artery disease (HCC)    a. Dopp 09/2011: 60-79% RICA, 40-59% LICA.;  b.  Carotid US (7/14):  Bilateral 60-79% => f/u 6 mos  . Cerebrovascular disease   . Chronic systolic heart failure (HCC)   . Coronary artery disease    a. AWMI requiring IABP 2005 s/p Horizon study stent-LAD, staged BMS-Cx, DESx2-RCA. b. Last Last LHC (6/06):  EF 25%, pLAD stent 40-50, after stent 40, dLAD 20, pCFX 20, pOM1 70, pRCA 20, mRCA stents ok.=> med Rx;  c.  Myoview (7/14):  EF 26%, large ant, septal, inf and apical infarct, no ischemia.  Med Rx continued  . Diabetes mellitus   . Heart murmur   . History of kidney stones   . HTN (hypertension)   . Hyperlipidemia    mixed  . ICD (implantable cardiac defibrillator), dual, in situ    St. Jude for severe LVD EF 25% 2/07 explanted 2010. Medtronic Virtuoso II DR Dual-chamber cardiverter-defibrillator  with pocket revision, Dr. Graciela Husbands  . Ischemic cardiomyopathy    a. Echo (09/27/12): EF 20%, diffuse HK with periapical AK, no LV thrombus noted, restrictive physiology, trivial MR, mild LAE, RVSF mildly reduced, PASP 39  . Pleural effusion 01/05/2016  . Presence of permanent cardiac pacemaker   .  PVD (peripheral vascular disease) (HCC)    a. ABI 09/2011 - R normal, L moderate - saw Dr. Kirke Corin - med rx.   Marland Kitchen RIATA ICD Lead--on advisory recall    . Tobacco abuse     PSH: Past Surgical History:  Procedure Laterality Date  . ABDOMINAL AORTAGRAM N/A 11/06/2013   Procedure: ABDOMINAL Ronny Flurry;  Surgeon: Chuck Hint, MD;  Location: [facility] CATH LAB;  Service:  Cardiovascular;  Laterality: N/A;  . APPENDECTOMY  2000   ruptured  . CARDIAC CATHETERIZATION N/A 01/07/2016   Procedure: Right/Left Heart Cath and Coronary Angiography;  Surgeon: Laurey Morale, MD;  Location: [facility] INVASIVE CV LAB;  Service: Cardiovascular;  Laterality: N/A;  . CARDIAC CATHETERIZATION N/A 01/28/2016   Procedure: Coronary Stent Intervention;  Surgeon: Tonny Bollman, MD;  Location: [facility] INVASIVE CV LAB;  Service: Cardiovascular;  Laterality: N/A;  . CARDIAC CATHETERIZATION N/A 01/28/2016   Procedure: Right Heart Cath;  Surgeon: Tonny Bollman, MD;  Location: [facility] INVASIVE CV LAB;  Service: Cardiovascular;  Laterality: N/A;  . CARDIAC CATHETERIZATION N/A 01/28/2016   Procedure: Intravascular Ultrasound/IVUS;  Surgeon: Tonny Bollman, MD;  Location: [facility] INVASIVE CV LAB;  Service: Cardiovascular;  Laterality: N/A;  . CHEST TUBE INSERTION Right 04/07/2016   Procedure: INSERTION PLEURAL DRAINAGE CATHETER;  Surgeon: Kerin Perna, MD;  Location: [facility] OR;  Service: Thoracic;  Laterality: Right;  . FEMORAL-POPLITEAL BYPASS GRAFT Right 11/07/2013   Procedure: Right FEMORAL-POPLITEAL ARTERY Bypass ;  Surgeon: Chuck Hint, MD;  Location: [facility] OR;  Service: Vascular;  Laterality: Right;  . FEMORAL-POPLITEAL BYPASS GRAFT Left 11/17/2014   Procedure: BYPASS GRAFT FEMORAL-POPLITEAL ARTERY USING GORE PROPATEN X 80CM VASCULAR GRAFT;  Surgeon: Chuck Hint, MD;  Location: [facility] OR;  Service: Vascular;  Laterality: Left;  . IMPLANTABLE CARDIOVERTER DEFIBRILLATOR (ICD) GENERATOR CHANGE N/A 12/16/2012   Procedure: ICD GENERATOR CHANGE;  Surgeon: [name], MD;  Location: [facility] CATH LAB;  Service: Cardiovascular;  Laterality: N/A;  . INTRAOPERATIVE ARTERIOGRAM Right 11/07/2013   Procedure: INTRA OPERATIVE ARTERIOGRAM;  Surgeon: Chuck Hint, MD;  Location: [facility] OR;  Service: Vascular;  Laterality: Right;  . LOWER EXTREMITY ANGIOGRAM Bilateral 11/06/2013   Procedure: LOWER EXTREMITY  ANGIOGRAM;  Surgeon: Chuck Hint, MD;  Location: [facility] CATH LAB;  Service: Cardiovascular;  Laterality: Bilateral;  . Medtronic Virtuoso II DR dual-chamber cardioverter-defibrillation with pocket revison     Dr. Sherryl Manges  . PERIPHERAL VASCULAR CATHETERIZATION N/A 09/25/2014   Procedure: Abdominal Aortogram;  Surgeon: Sherren Kerns, MD;  Location: [facility] INVASIVE CV LAB;  Service: Cardiovascular;  Laterality: N/A;  . US GUIDED THORACENTESIS RIGHT ([facility] HX) Right 01/05/2016    ALLERGIES: Allergies  Allergen Reactions  . Metformin And Related Nausea And Vomiting    *Only the extended release* (does this)    MEDICATIONS: Current Outpatient Prescriptions  Medication Sig Dispense Refill  . aspirin 81 MG tablet Take 1 tablet (81 mg total) by mouth daily. 30 tablet 3  . atorvastatin (LIPITOR) 80 MG tablet Take 1 tablet (80 mg total) by mouth every other day. 30 tablet 11  . clopidogrel (PLAVIX) 75 MG tablet Take 1 tablet (75 mg total) by mouth daily. 30 tablet 6  . digoxin (LANOXIN) 0.125 MG tablet Take 1 tablet (0.125 mg total) by mouth daily. 30 tablet 11  . ezetimibe (ZETIA) 10 MG tablet TAKE ONE TABLET BY MOUTH ONCE DAILY 30 tablet 6  . insulin NPH-regular Human (NOVOLIN 70/30) (70-30) 100 UNIT/ML injection Inject 16-32 Units into  the skin 2 (two) times daily with a meal. 32 units in the morning, 16 units in the evening    . milrinone (PRIMACOR) 20 MG/100 ML SOLN infusion Inject 24.5 mcg/min into the vein continuous. 100 mL 0  . potassium chloride SA (K-DUR,KLOR-CON) 20 MEQ tablet Take 1 tablet (20 mEq total) by mouth daily. Take extra 2 meq (1 tablet) on Saturdays with metolazone. 30 tablet 6  . spironolactone (ALDACTONE) 25 MG tablet Take 0.5 tablets (12.5 mg total) by mouth daily. 15 tablet 6  . torsemide (DEMADEX) 20 MG tablet Take 4 tablets (80 mg total) by mouth 2 (two) times daily. 240 tablet 6   No current facility-administered medications for this visit.     LABS: Lab  Results  Component Value Date   WBC 7.0 04/05/2016   HGB 11.6 (L) 04/05/2016   HCT 34.0 (L) 04/05/2016   MCV 86.1 04/05/2016   PLT 118 (L) 04/05/2016      Component Value Date/Time   NA 132 (L) 04/10/2016 0500   K 3.2 (L) 04/10/2016 0500   CL 89 (L) 04/10/2016 0500   CO2 32 04/10/2016 0500   GLUCOSE 309 (H) 04/10/2016 0500   BUN 17 04/10/2016 0500   CREATININE 0.80 04/10/2016 0500   CREATININE 0.77 04/05/2015 0950   CALCIUM 8.7 (L) 04/10/2016 0500   GFRNONAA >60 04/10/2016 0500   GFRNONAA >89 01/12/2014 0848   GFRAA >60 04/10/2016 0500   GFRAA >89 01/12/2014 0848   Lab Results  Component Value Date   INR 1.07 01/18/2016   INR 1.32 01/07/2016   INR 1.22 11/06/2014   No results found for: PTT  SOCIAL HISTORY: Social History   Social History  . Marital status: Divorced    Spouse name: N/A  . Number of children: N/A  . Years of education: N/A   Occupational History  . 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 Smoking status: Current Every Day Smoker    Packs/day: 1.00    Years: 45.00    Types: Cigarettes  . Smokeless tobacco: Never Used     Comment: pt reports he is stress smoker  . Alcohol use No  . Drug use: No     Comment: former Cocain, Acid, Marijuana - 25 years ago  . Sexual activity: No   Other Topics Concern  . Not on file   Social History Narrative   Divorced.    FAMILY HISTORY: Family History  Problem Relation Age of Onset  . Diabetes Mother   . Coronary artery disease Father   . Heart disease Father     before age 23    REVIEW OF SYSTEMS: Reviewed with the patient has per history of present illness.  Psych: Patient denies having dental phobia.  DENTAL HISTORY: CHIEF COMPLAINT: Patient referred by Dr. Shirlee Latch for a dental consultation.   HPI: [name] is a 61 year old male with ischemic cardiomyopathy and chronic systolic heart failure. Patient with anticipated placement of LVAD.   Patient is now seen as part of a pre-LVAD placement dental protocol examination to rule out dental infection that may affect the patient's systemic health and anticipated LVAD placement.  The patient currently denies acute toothaches, swellings, or abscesses. Patient has not seen a dentist for "a long time. " The patient was seen by dentist that referred him to an oral surgeon for extraction. This was approximately  5 years ago. Patient denies any complications from that  dental extraction. Patient does not seek regular dental care. Patient has no partial dentures. Patient denies having dental phobia.   DENTAL EXAMINATION: GENERAL: Patient well-developed, well-nourished male in no acute distress. HEAD AND NECK: No palpable neck lymphadenopathy. The patient denies acute TMJ symptoms. INTRAORAL EXAM: Patient has incipient xerostomia. There is no evidence of oral abscess formation. There is atrophy of the edentulous alveolar ridges.  DENTITION: Patient has multiple missing teeth. Patient has retained root segments in the area of tooth numbers 5,7-14, 20-24, AND 26-30. PERIODONTAL: Patient has chronic periodontitis with plaque calculus cancellations, July gentle recession and tooth mobility. There is moderate bone loss noted. DENTAL CARIES/SUBOPTIMAL RESTORATIONS: Multiple dental caries are noted. ENDODONTIC: Patient currently denies acute pulpitis symptoms. Patient has multiple areas of periapical pathology and radiolucency. CROWN AND BRIDGE: There are no crown or bridge restorations. PROSTHODONTIC: Patient denies having partial dentures. OCCLUSION: Patient has a poor occlusal scheme secondary to multiple missing teeth, multiple retained root segments, and lack of replacement of missing teeth with dental prostheses.  RADIOGRAPHIC INTERPRETATION: An orthopantogram was taken. There are multiple missing teeth. There multiple retained root segments. There multiple areas of periapical pathology and  radiolucency. There is atrophy of the edentulous alveolar ridges. There are multiple dental caries noted.   ASSESSMENTS: 1. Ischemic cardiomyopathy 2. Chronic, systolic heart failure 3. Pre-LVAD placement dental protocol 4. Chronic apical periodontitis 5. Dental caries 6. Multiple retained root segments 7. Chronic periodontitis of bone loss 8. Gingival recession 9. Loose teeth  10. Multiple missing teeth 11. No partial dentures 12. Poor occlusal scheme and malocclusion 13. Risk for bleeding with invasive dental procedures due to current Plavix therapy 14. Risk for complications up to and including death with anticipated invasive dental procedures in the operating with general anesthesia due to overall cardiovascular and respiratory compromise.   PLAN/RECOMMENDATIONS: 1. I discussed the risks, benefits, and complications of various treatment options with the patient in relationship to his medical and dental conditions, anticipated LVAD procedure, and risk for infection and endocarditis. We discussed various treatment options to include no treatment, multiple extractions with alveoloplasty, pre-prosthetic surgery as indicated, periodontal therapy, dental restorations, root canal therapy, crown and bridge therapy, implant therapy, and replacement of missing teeth as indicated. The patient currently wishes to proceed with extraction of remaining teeth with alveoloplasty in the operating room with general anesthesia. This has been scheduled for 04/26/2016 at 930 a.m. at [facility]. The patient will then follow-up with a dentist of his choice for fabrication of upper and lower complete dentures after adequate healing and once medically stable from the anticipated LVAD procedure.  Patient will continue his Plavix therapy for the dental extractions procedures due to recent stent placement in January of 2018 per discussion with Dr. Shirlee Latch.  Patient may require overnight observation due to the potential  risk for increased postoperative bleeding while on the Plavix therapy.   2. Discussion of findings with medical team and coordination of future medical and dental care as needed. Dr. Donata Clay has been contacted and indicates that proceeding with surgery on 4/25 is acceptable with the Pleurax drain in place.  I spent in excess of  120 minutes during the conduct of this consultation and >50% of this time involved direct face-to-face encounter for counseling and/or coordination of the patient's care.    Charlynne Pander, DDS

## 2016-04-19 ENCOUNTER — Other Ambulatory Visit (HOSPITAL_COMMUNITY): Payer: Self-pay | Admitting: Dentistry

## 2016-04-19 ENCOUNTER — Encounter (HOSPITAL_COMMUNITY): Payer: BLUE CROSS/BLUE SHIELD

## 2016-04-19 ENCOUNTER — Ambulatory Visit (HOSPITAL_COMMUNITY): Payer: Self-pay | Admitting: Cardiac Rehabilitation

## 2016-04-19 ENCOUNTER — Other Ambulatory Visit: Payer: Self-pay | Admitting: *Deleted

## 2016-04-19 DIAGNOSIS — K083 Retained dental root: Secondary | ICD-10-CM | POA: Insufficient documentation

## 2016-04-19 DIAGNOSIS — J90 Pleural effusion, not elsewhere classified: Secondary | ICD-10-CM

## 2016-04-19 DIAGNOSIS — K045 Chronic apical periodontitis: Secondary | ICD-10-CM | POA: Insufficient documentation

## 2016-04-19 DIAGNOSIS — K0530 Chronic periodontitis, unspecified: Secondary | ICD-10-CM | POA: Insufficient documentation

## 2016-04-19 DIAGNOSIS — Z9189 Other specified personal risk factors, not elsewhere classified: Secondary | ICD-10-CM | POA: Insufficient documentation

## 2016-04-19 DIAGNOSIS — K029 Dental caries, unspecified: Secondary | ICD-10-CM | POA: Insufficient documentation

## 2016-04-19 NOTE — Progress Notes (Signed)
Discharge Summary  Patient Details  Name: [name] MRN: [id number] Date of Birth: [birth year] Referring Provider:     CARDIAC REHAB PHASE II ORIENTATION from 03/07/2016 in [facility]  Referring Provider  Marca Ancona MD       Number of Visits: 10   Reason for Discharge:  Pt discharged due to admission for advancing heart failure.    Smoking History:  History  Smoking Status  . Current Every Day Smoker  . Packs/day: 0.50  . Years: 45.00  . Types: Cigarettes  Smokeless Tobacco  . Never Used    Comment: pt reports he is stress smoker    Diagnosis:  Stented coronary artery  ADL UCSD:   Initial Exercise Prescription:     Initial Exercise Prescription - 03/07/16 1100      Date of Initial Exercise RX and Referring Provider   Date 03/07/16   Referring Provider Marca Ancona MD     Recumbant Bike   Level 1.5   Minutes 10   METs 1.4     NuStep   Level 2   SPM 85   Minutes 10   METs 1.8     Arm Ergometer   Level 1   Minutes 10   METs 1     Prescription Details   Frequency (times per week) 3   Duration Progress to 30 minutes of continuous aerobic without signs/symptoms of physical distress     Intensity   THRR 40-80% of Max Heartrate 64-127   Ratings of Perceived Exertion 11-13   Perceived Dyspnea 0-4     Progression   Progression Continue to progress workloads to maintain intensity without signs/symptoms of physical distress.     Resistance Training   Training Prescription Yes   Weight 2lbs   Reps 10-15      Discharge Exercise Prescription (Final Exercise Prescription Changes):     Exercise Prescription Changes - 03/31/16 0819      Response to Exercise   Blood Pressure (Admit) (P)  112/70   Blood Pressure (Exercise) (P)  112/60   Blood Pressure (Exit) (P)  108/60   Heart Rate (Admit) (P)  87 bpm   Heart Rate (Exercise) (P)  105 bpm   Heart Rate (Exit) (P)  85 bpm   Rating of Perceived Exertion  (Exercise) (P)  12   Symptoms (P)  claudication pain   Comments (P)  pt c/o dizziness   Duration (P)  Continue with 30 min of aerobic exercise without signs/symptoms of physical distress.   Intensity (P)  THRR unchanged     Progression   Progression (P)  Continue to progress workloads to maintain intensity without signs/symptoms of physical distress.   Average METs (P)  2.4     Resistance Training   Training Prescription (P)  Yes   Weight (P)  3lbs   Reps (P)  10-15   Time (P)  10 Minutes     Recumbant Bike   Level (P)  2.5   Minutes (P)  10   METs (P)  2.6     NuStep   Level (P)  3   SPM (P)  90   Minutes (P)  10   METs (P)  2.5     Arm Ergometer   Level (P)  3   Minutes (P)  10   METs (P)  2.2     Home Exercise Plan   Plans to continue exercise at (P)  Home (comment)  d/c HEP due to advancing HF   Frequency (P)  Add 2 additional days to program exercise sessions.   Initial Home Exercises Provided (P)  03/20/16      Functional Capacity:     6 Minute Walk    Row Name 03/07/16 1101         6 Minute Walk   Phase Initial     Distance 790 feet     Walk Time 3 minutes     # of Rest Breaks 1  1 minute rest break     MPH 2.99     METS 2.35     RPE 14     VO2 Peak 8.23     Symptoms Yes (comment)     Comments B legs/hip pain8/10      Resting HR 91 bpm     Resting BP 120/71     Max Ex. HR 97 bpm     Max Ex. BP 130/60     2 Minute Post BP 106/74        Psychological, QOL, Others - Outcomes: PHQ 2/9: Depression screen PHQ 2/9 03/13/2016  Decreased Interest 0  Down, Depressed, Hopeless 0  PHQ - 2 Score 0  Some recent data might be hidden    Quality of Life:     Quality of Life - 03/22/16 0929      Quality of Life Scores   Health/Function Pre 21.04 %  scores reflect pt concerns about CV symptoms. pt with continued fatigue and deconditioning. pt restricted in physical abilities. pt is looking forward to possible BEAT HF research participation.      Socioeconomic Pre 22.5 %   Psych/Spiritual Pre 23.79 %   Family Pre 23.63 %  pt is working toward eliminating family stressors.  pt exhibits positive coping skills with hopeful outlook. unfortunately, pt uses tobacco as stress reliever.      GLOBAL Pre 22.28 %  pt enjoys his children and 3 grandchildren.  pt expresses desire to decrease his CAD RF.       Personal Goals: Goals established at orientation with interventions provided to work toward goal.     Personal Goals and Risk Factors at Admission - 03/22/16 1156      Core Components/Risk Factors/Patient Goals on Admission    Weight Management Obesity   Intervention Weight Management: Develop a combined nutrition and exercise program designed to reach desired caloric intake, while maintaining appropriate intake of nutrient and fiber, sodium and fats, and appropriate energy expenditure required for the weight goal.;Weight Management: Provide education and appropriate resources to help participant work on and attain dietary goals.;Weight Management/Obesity: Establish reasonable short term and long term weight goals.;Obesity: Provide education and appropriate resources to help participant work on and attain dietary goals.   Expected Outcomes Long Term: Adherence to nutrition and physical activity/exercise program aimed toward attainment of established weight goal;Short Term: Continue to assess and modify interventions until short term weight is achieved;Weight Loss: Understanding of general recommendations for a balanced deficit meal plan, which promotes 1-2 lb weight loss per week and includes a negative energy balance of [phone] kcal/d   Tobacco Cessation Yes   Intervention Assist the participant in steps to quit. Provide individualized education and counseling about committing to Tobacco Cessation, relapse prevention, and pharmacological support that can be provided by physician.;Education officer, environmental, assist with locating and accessing  local/national Quit Smoking programs, and support quit date choice.   Expected Outcomes Short Term: Will demonstrate readiness to quit, by  selecting a quit date.;Short Term: Will quit all tobacco product use, adhering to prevention of relapse plan.;Long Term: Complete abstinence from all tobacco products for at least 12 months from quit date.   Diabetes Yes   Intervention Provide education about signs/symptoms and action to take for hypo/hyperglycemia.;Provide education about proper nutrition, including hydration, and aerobic/resistive exercise prescription along with prescribed medications to achieve blood glucose in normal ranges: Fasting glucose 65-99 mg/dL   Expected Outcomes Short Term: Participant verbalizes understanding of the signs/symptoms and immediate care of hyper/hypoglycemia, proper foot care and importance of medication, aerobic/resistive exercise and nutrition plan for blood glucose control.;Long Term: Attainment of HbA1C < 7%.   Hypertension Yes   Intervention Provide education on lifestyle modifcations including regular physical activity/exercise, weight management, moderate sodium restriction and increased consumption of fresh fruit, vegetables, and low fat dairy, alcohol moderation, and smoking cessation.;Monitor prescription use compliance.   Expected Outcomes Short Term: Continued assessment and intervention until BP is < 140/62mm HG in hypertensive participants. < 130/4mm HG in hypertensive participants with diabetes, heart failure or chronic kidney disease.;Long Term: Maintenance of blood pressure at goal levels.   Lipids Yes   Intervention Provide education and support for participant on nutrition & aerobic/resistive exercise along with prescribed medications to achieve LDL 70mg , HDL >40mg .   Expected Outcomes Short Term: Participant states understanding of desired cholesterol values and is compliant with medications prescribed. Participant is following exercise prescription and  nutrition guidelines.;Long Term: Cholesterol controlled with medications as prescribed, with individualized exercise RX and with personalized nutrition plan. Value goals: LDL < , HDL > 40 mg.   Stress Yes   Intervention Offer individual and/or small group education and counseling on adjustment to heart disease, stress management and health-related lifestyle change. Teach and support self-help strategies.;Refer participants experiencing significant psychosocial distress to appropriate mental health specialists for further evaluation and treatment. When possible, include family members and significant others in education/counseling sessions.   Expected Outcomes Short Term: Participant demonstrates changes in health-related behavior, relaxation and other stress management skills, ability to obtain effective social support, and compliance with psychotropic medications if prescribed.;Long Term: Emotional wellbeing is indicated by absence of clinically significant psychosocial distress or social isolation.       Personal Goals Discharge:     Goals and Risk Factor Review    Row Name 03/22/16 1158 03/28/16 1540 05/12/16 1203         Core Components/Risk Factors/Patient Goals Review   Personal Goals Review Weight Management/Obesity;Heart Failure;Lipids;Tobacco Cessation;Diabetes;Hypertension;Stress;Other Weight Management/Obesity;Heart Failure;Lipids;Tobacco Cessation;Diabetes;Hypertension;Stress;Other Weight Management/Obesity     Review  - Provide exercise programming and education on risk factors reductions for CVD to prevent progression of disease Pt following Step 1 of the TLC diet. Pt has lost 5.7 lb since starting rehab.      Expected Outcomes  - pt will attend CR for exercise and education to decrease CAD risk factors.  pt will consider decreasing or total cessation of tobacco products.   -        Nutrition & Weight - Outcomes:     Pre Biometrics - 03/07/16 1111      Pre Biometrics    Waist Circumference 42.5 inches   Hip Circumference 43 inches   Waist to Hip Ratio 0.99 %   Triceps Skinfold 22 mm   % Body Fat 30.6 %   Grip Strength 30 kg   Flexibility 12.5 in   Single Leg Stand 1.66 seconds         Post Biometrics -  05/05/16 0817       Post  Biometrics   Weight 218 lb 11.1 oz (99.2 kg)      Nutrition:     Nutrition Therapy & Goals - 03/15/16 1126      Nutrition Therapy   Diet Carb Modified, Therapeutic Lifestyle Changes     Personal Nutrition Goals   Nutrition Goal Pt to identify and limit food sources of saturated fat, trans fat, and sodium   Personal Goal #2 CBG's as close to normal as safely possible.      Intervention Plan   Intervention Prescribe, educate and counsel regarding individualized specific dietary modifications aiming towards targeted core components such as weight, hypertension, lipid management, diabetes, heart failure and other comorbidities.   Expected Outcomes Short Term Goal: Understand basic principles of dietary content, such as calories, fat, sodium, cholesterol and nutrients.;Long Term Goal: Adherence to prescribed nutrition plan.      Nutrition Discharge:     Nutrition Assessments - 03/15/16 1127      MEDFICTS Scores   Pre Score 66      Education Questionnaire Score:     Knowledge Questionnaire Score - 03/07/16 1057      Knowledge Questionnaire Score   Pre Score 25/28      Goals reviewed with patient; copy given to patient.

## 2016-04-20 LAB — ECHOCARDIOGRAM COMPLETE
AVLVOTPG: 2 mmHg
CHL CUP MV DEC (S): 120
CHL CUP RV SYS PRESS: 47 mmHg
CHL CUP TV REG PEAK VELOCITY: 333 cm/s
E decel time: 120 msec
FS: 3 % — AB (ref 28–44)
IVS/LV PW RATIO, ED: 0.9
LA ID, A-P, ES: 48 mm
LA diam end sys: 48 mm
LA diam index: 2.12 cm/m2
LA vol: 106 mL
LAVOLA4C: 115 mL
LAVOLIN: 46.8 mL/m2
LV TDI E'LATERAL: 9.38
LV TDI E'MEDIAL: 3.15
LVELAT: 9.38 cm/s
LVOT SV: 38 mL
LVOT VTI: 12.1 cm
LVOT area: 3.14 cm2
LVOT diameter: 20 mm
LVOT peak vel: 74.7 cm/s
MV pk E vel: 2.7 m/s
PW: 10 mm — AB (ref 0.6–1.1)
RV LATERAL S' VELOCITY: 10.2 cm/s
TAPSE: 23.3 mm
TR max vel: 333 cm/s

## 2016-04-21 ENCOUNTER — Encounter (HOSPITAL_COMMUNITY): Payer: Self-pay

## 2016-04-21 ENCOUNTER — Encounter (HOSPITAL_COMMUNITY)
Admission: RE | Admit: 2016-04-21 | Discharge: 2016-04-21 | Disposition: A | Discharge status: Home / Self Care | Payer: BLUE CROSS/BLUE SHIELD | Source: Ambulatory Visit | Attending: Dentistry | Admitting: Dentistry

## 2016-04-21 ENCOUNTER — Encounter (HOSPITAL_COMMUNITY): Payer: BLUE CROSS/BLUE SHIELD

## 2016-04-21 ENCOUNTER — Ambulatory Visit (HOSPITAL_COMMUNITY)
Admission: RE | Admit: 2016-04-21 | Discharge: 2016-04-21 | Disposition: A | Discharge status: Home / Self Care | Payer: BLUE CROSS/BLUE SHIELD | Source: Ambulatory Visit | Attending: Cardiology | Admitting: Cardiology

## 2016-04-21 ENCOUNTER — Encounter (HOSPITAL_COMMUNITY): Payer: Self-pay | Admitting: *Deleted

## 2016-04-21 ENCOUNTER — Encounter (HOSPITAL_COMMUNITY)
Admission: RE | Admit: 2016-04-21 | Discharge: 2016-04-21 | Disposition: A | Discharge status: Home / Self Care | Payer: BLUE CROSS/BLUE SHIELD | Source: Ambulatory Visit | Attending: Cardiovascular Disease | Admitting: Cardiovascular Disease

## 2016-04-21 VITALS — BP 124/68 | HR 89 | Wt 203.8 lb

## 2016-04-21 DIAGNOSIS — Z01812 Encounter for preprocedural laboratory examination: Secondary | ICD-10-CM | POA: Insufficient documentation

## 2016-04-21 DIAGNOSIS — I959 Hypotension, unspecified: Secondary | ICD-10-CM | POA: Diagnosis not present

## 2016-04-21 DIAGNOSIS — Z01810 Encounter for preprocedural cardiovascular examination: Secondary | ICD-10-CM | POA: Diagnosis not present

## 2016-04-21 DIAGNOSIS — I252 Old myocardial infarction: Secondary | ICD-10-CM | POA: Diagnosis not present

## 2016-04-21 DIAGNOSIS — Z955 Presence of coronary angioplasty implant and graft: Secondary | ICD-10-CM

## 2016-04-21 DIAGNOSIS — Z95810 Presence of automatic (implantable) cardiac defibrillator: Secondary | ICD-10-CM | POA: Insufficient documentation

## 2016-04-21 DIAGNOSIS — I2510 Atherosclerotic heart disease of native coronary artery without angina pectoris: Secondary | ICD-10-CM | POA: Insufficient documentation

## 2016-04-21 DIAGNOSIS — J90 Pleural effusion, not elsewhere classified: Secondary | ICD-10-CM

## 2016-04-21 DIAGNOSIS — I5022 Chronic systolic (congestive) heart failure: Secondary | ICD-10-CM | POA: Diagnosis not present

## 2016-04-21 DIAGNOSIS — E119 Type 2 diabetes mellitus without complications: Secondary | ICD-10-CM | POA: Insufficient documentation

## 2016-04-21 DIAGNOSIS — I454 Nonspecific intraventricular block: Secondary | ICD-10-CM | POA: Insufficient documentation

## 2016-04-21 DIAGNOSIS — I110 Hypertensive heart disease with heart failure: Secondary | ICD-10-CM | POA: Insufficient documentation

## 2016-04-21 DIAGNOSIS — I739 Peripheral vascular disease, unspecified: Secondary | ICD-10-CM | POA: Diagnosis not present

## 2016-04-21 DIAGNOSIS — I679 Cerebrovascular disease, unspecified: Secondary | ICD-10-CM | POA: Insufficient documentation

## 2016-04-21 DIAGNOSIS — I6529 Occlusion and stenosis of unspecified carotid artery: Secondary | ICD-10-CM | POA: Insufficient documentation

## 2016-04-21 HISTORY — DX: Dyspnea, unspecified: R06.00

## 2016-04-21 HISTORY — DX: Headache, unspecified: R51.9

## 2016-04-21 HISTORY — DX: Unspecified osteoarthritis, unspecified site: M19.90

## 2016-04-21 HISTORY — DX: Headache: R51

## 2016-04-21 LAB — CBC
HEMATOCRIT: 39.8 % (ref 39.0–52.0)
HEMOGLOBIN: 13.3 g/dL (ref 13.0–17.0)
MCH: 28.9 pg (ref 26.0–34.0)
MCHC: 33.4 g/dL (ref 30.0–36.0)
MCV: 86.3 fL (ref 78.0–100.0)
Platelets: 158 10*3/uL (ref 150–400)
RBC: 4.61 MIL/uL (ref 4.22–5.81)
RDW: 16.3 % — ABNORMAL HIGH (ref 11.5–15.5)
WBC: 8.3 10*3/uL (ref 4.0–10.5)

## 2016-04-21 LAB — BASIC METABOLIC PANEL
ANION GAP: 10 (ref 5–15)
BUN: 20 mg/dL (ref 6–20)
CO2: 34 mmol/L — ABNORMAL HIGH (ref 22–32)
Calcium: 9.1 mg/dL (ref 8.9–10.3)
Chloride: 91 mmol/L — ABNORMAL LOW (ref 101–111)
Creatinine, Ser: 1.04 mg/dL (ref 0.61–1.24)
Glucose, Bld: 290 mg/dL — ABNORMAL HIGH (ref 65–99)
POTASSIUM: 3.2 mmol/L — AB (ref 3.5–5.1)
SODIUM: 135 mmol/L (ref 135–145)

## 2016-04-21 LAB — GLUCOSE, CAPILLARY: GLUCOSE-CAPILLARY: 263 mg/dL — AB (ref 65–99)

## 2016-04-21 LAB — DIGOXIN LEVEL: DIGOXIN LVL: 0.6 ng/mL — AB (ref 0.8–2.0)

## 2016-04-21 LAB — BRAIN NATRIURETIC PEPTIDE: B NATRIURETIC PEPTIDE 5: 356.2 pg/mL — AB (ref 0.0–100.0)

## 2016-04-21 NOTE — Progress Notes (Signed)
Pt. States that Dr. Shirlee Latch instructed him to continue plavix and ASA.    Spoke with Revonda Standard concerning diuretics she talk with pt. About whether he is to take them the morning of surgery.   [employer] rep contacted with information about surgery date and time.

## 2016-04-21 NOTE — Pre-Procedure Instructions (Signed)
[name]  04/21/2016      [facility] [address] [place] - [name] [address] Phone: [phone] Fax: [phone]    Your procedure is scheduled on 04-26-2016 Wednesday.  Report to [facility] Admitting at 7:30 A.M.  Call this number if you have problems the morning of surgery:  [phone]   Remember:  Do not eat food or drink liquids after midnight.  Take these medicines the morning of surgery with A SIP OF WATER digoxin,Zeta,Aspirin,plavix,digoxin,potassium,      How to Manage Your Diabetes Before and After Surgery  Why is it important to control my blood sugar before and after surgery? . Improving blood sugar levels before and after surgery helps healing and can limit problems. . A way of improving blood sugar control is eating a healthy diet by: o  Eating less sugar and carbohydrates o  Increasing activity/exercise o  Talking with your doctor about reaching your blood sugar goals . High blood sugars (greater than 180 mg/dL) can raise your risk of infections and slow your recovery, so you will need to focus on controlling your diabetes during the weeks before surgery. . Make sure that the doctor who takes care of your diabetes knows about your planned surgery including the date and location.  How do I manage my blood sugar before surgery? . Check your blood sugar at least 4 times a day, starting 2 days before surgery, to make sure that the level is not too high or low. o Check your blood sugar the morning of your surgery when you wake up and every 2 hours until you get to the Short Stay unit. . If your blood sugar is less than 70 mg/dL, you will need to treat for low blood sugar: o Do not take insulin. o Treat a low blood sugar (less than 70 mg/dL) with  cup of clear juice (cranberry or apple), 4 glucose tablets, OR glucose gel. o Recheck blood sugar in 15 minutes after treatment (to make sure it  is greater than 70 mg/dL). If your blood sugar is not greater than 70 mg/dL on recheck, call [phone] for further instructions. . Report your blood sugar to the short stay nurse when you get to Short Stay.  . If you are admitted to the hospital after surgery: o Your blood sugar will be checked by the staff and you will probably be given insulin after surgery (instead of oral diabetes medicines) to make sure you have good blood sugar levels. o The goal for blood sugar control after surgery is 80-180 mg/dL.              WHAT DO I DO ABOUT MY DIABETES MEDICATION?     . THE NIGHT BEFORE SURGERY, take _______7____ units of ____70/30insulin.       Marland Kitchen HE MORNING OF SURGERY, take _____0________ units of ___70/30_______insulin. .        Reviewed and Endorsed by [facility] Patient Education Committee, August 2015      Do not wear jewelry  Do not wear lotions, powders, or perfumes, or deoderant.  Do not shave 48 hours prior to surgery.  Men may shave face and neck.  Do not bring valuables to the hospital.   [facility] is not responsible for any belongings or valuables.  Contacts, dentures or bridgework may not be worn into surgery.  Leave your suitcase in the car.  After surgery it  may be brought to your room.  For patients admitted to the hospital, discharge time will be determined by your treatment team.  Patients discharged the day of surgery will not be allowed to drive home.   Special Instructions: [place] - Preparing for Surgery  Before surgery, you can play an important role.  Because skin is not sterile, your skin needs to be as free of germs as possible.  You can reduce the number of germs on you skin by washing with CHG (chlorahexidine gluconate) soap before surgery.  CHG is an antiseptic cleaner which kills germs and bonds with the skin to continue killing germs even after washing.  Please DO NOT use if you have an allergy to CHG or antibacterial soaps.  If  your skin becomes reddened/irritated stop using the CHG and inform your nurse when you arrive at Short Stay.  Do not shave (including legs and underarms) for at least 48 hours prior to the first CHG shower.  You may shave your face.  Please follow these instructions carefully:   1.  Shower with CHG Soap the night before surgery and the   morning of Surgery.  2.  If you choose to wash your hair, wash your hair first as usual with your normal shampoo.  3.  After you shampoo, rinse your hair and body thoroughly to remove the  Shampoo.  4.  Use CHG as you would any other liquid soap.  You can apply chg directly  to the skin and wash gently with scrungie or a clean washcloth.  5.  Apply the CHG Soap to your body ONLY FROM THE NECK DOWN.   Do not use on open wounds or open sores.  Avoid contact with your eyes,  ears, mouth and genitals (private parts).  Wash genitals (private parts) with your normal soap.  6.  Wash thoroughly, paying special attention to the area where your surgery will be performed.  7.  Thoroughly rinse your body with warm water from the neck down.  8.  DO NOT shower/wash with your normal soap after using and rinsing o  the CHG Soap.  9.  Pat yourself dry with a clean towel.            10.  Wear clean pajamas.            11.  Place clean sheets on your bed the night of your first shower and do not sleep with pets.  Day of Surgery  Do not apply any lotions/deodorants the morning of surgery.  Please wear clean clothes to the hospital/surgery center.

## 2016-04-21 NOTE — Progress Notes (Signed)
CSW referred to meet with patient to begin LVAD assessment process. CSW met with patient in the clinic. CSW discussed LVAD process and began some of the assessments questions. Patient reports he resides with his 62yo father who he reports is in good shape and will be his primary caregiver. Patient reports his sister lives in [place] and will be the back up caregiver. CSW discuss briefly financial and insurance needs as well. Patient appears to have a basic understanding and is motivated for VAD implant should he be a candidate. Patient will return to the clinic in a few weeks for follow up and will complete LVAD assessment at that time. Raquel Sarna, [place], [place]

## 2016-04-21 NOTE — Patient Instructions (Signed)
Labs today  Right Heart Catheterization on Monday 05/01/16, see instruction sheet  Your physician recommends that you schedule a follow-up appointment in: 3 weeks

## 2016-04-21 NOTE — Progress Notes (Signed)
Anesthesia PAT Evaluation: Patient is a 61 year old male scheduled for multiple teeth extraction on 04/26/16 by Dr. Kristin Bruins. He is for anticipated LVAD in the future, and is scheduled to see Dr. Donata Clay on 05/21/16.  History includes smoking, CAD (a. AWMI requiring IABP 2005 s/p Horizon study stent-LAD, staged BMS-Cx, DESx2-RCA. b. DES LAD and PTCA OM1 01/28/16), chronic systolic CHF, ischemic cardiomyopathy, [employer] Jude AICD (generator replacement 12/16/12; left chest wall), murmur, right pleural effusion s/p right thoracentesis 01/05/16 and right PleurX catheter 04/07/16, HTN, DM2, hyperlipidemia, carotid artery disease, PVD s/p right FPBG 11/07/13 and left FPBG 11/17/14, appendectomy. Last CHF admission 04/05/16-04/10/16 with AKI in the setting of low output HF. He had right Pleurex catheter inserted and was started on milrinone.  - PCP is Dr. Sigmund Hazel. - Primary cardiologist is Dr. [name]. - HF Cardiologist is Dr. Marca Ancona, last visit 04/21/16 (office note still pending). He is staying of Plavix per discussion with Dr. Kristin Bruins. He is scheduled for follow-up RHC 05/01/16. (RHC 01/07/16 with mean RA 21, PA 69/31 mean 45, mean PCWP 27, CI 2.37, PVR 3.3 WU. Repeat RHC 01/28/16 with mean RA 16, mean PCWP 21, CI 1.92.) He is being considered for LVAD, but would not be a transplant candidate at this point with ongoing smoking.  - EP cardiologist is Dr. Sherryl Manges, last office visit 04/16/15. Last interrogation 02/11/16. He is not pacer dependent. - CT surgeon is Dr. Donata Clay who is aware of surgery plans.  - Endocrinologist is Dr. Talmage Coin, reportedly last visit 04/17/16 with insulin adjustment to 30 units/20 units/10 unite TID.  - Vascular surgeon is Dr. Waverly Ferrari.  Medications include ASA 81 mg, Lipitor, Plavix, digoxin, Zetia, Novolin 70/30, milrinone infusion, KCL, spironolactone 25 mg daily, torsemide 20 mg BID.   BP 125/70   Pulse 80   Temp 36.7 C   Resp 18   Ht 6' (1.829 m)    Wt 203 lb (92.1 kg)   SpO2 100%   BMI 27.53 kg/m   Heart overall regular rhythm with occasional ectopic beast. Lung clear. He is missing several teeth. There is mild ankle edema (R > L). LE skin is hyperemic, scaly. No conversational dyspnea. Patient denied any acute CV/CHF symptoms. His weights have been stable. No chest pain. No SOB at rest. He can lie flat if needed. He drains PleurX about every other day (400cc-125cc-250cc so far this week). HHRN comes weekly to due to milrinone pump and PICC. He denied any issues with anesthesia. He just saw Dr. Sharl Ma this week and insulin was adjusted due to hyperglycemia. His fasting CBGs are still in the 200's (high 200's at times). I advised him to follow-up with Dr. Sharl Ma on Monday if fasting CBG still that high to inquire if any further adjustments advised in light of upcoming surgery.   EKG 04/21/16 ([facility]): Probable underlying SR, multiple PVCs. LAD, non-specific intra-ventricular conduction block, cannot rule out septal infarct (age undetermined), lateral infarct (age undetermined), inferior infarct, age undetermined. Anterolateral infarct, age undetermined.   Echo 03/24/16: Study Conclusions - Left ventricle: The cavity size was moderately dilated. Systolic   function was severely reduced. The estimated ejection fraction   was in the range of 20% to 25%. Severe diffuse hypokinesis with   distinct regional wall motion abnormalities. There is akinesis of   the entire inferoseptal and apical septal myocardium. There is   akinesis of the apicalanterior myocardium. There is akinesis of   the midanteroseptal myocardium. There  was a reduced contribution   of atrial contraction to ventricular filling, due to increased   ventricular diastolic pressure or atrial contractile dysfunction.   Doppler parameters are consistent with a reversible restrictive   pattern, indicative of decreased left ventricular diastolic   compliance and/or increased left  atrial pressure (grade 3   diastolic dysfunction). Doppler parameters are consistent with   high ventricular filling pressure. - Aortic valve: Mildly to moderately calcified annulus. Trileaflet;   normal thickness, mildly calcified leaflets. There was trivial   regurgitation. - Mitral valve: Calcified annulus. Mild diffuse calcification of   the anterior leaflet and posterior leaflet, with mild involvement   of chords. There was mild regurgitation. - Left atrium: The atrium was moderately dilated. - Right ventricle: The cavity size was mildly dilated. Wall   thickness was normal. Pacer wire or catheter noted in right   ventricle. - Atrial septum: There was increased thickness of the septum,   consistent with lipomatous hypertrophy. - Pulmonic valve: There was trivial regurgitation. - Pulmonary arteries: PA peak pressure: 47 mm Hg (S). - Line: A venous catheter was visualized in the superior vena cava,   with its tip in the right atrium. No abnormal features noted. Impressions: - The right ventricular systolic pressure was increased consistent   with moderate pulmonary hypertension.  RHC/LHC 01/28/16 (Dr. Tonny Bollman): Conclusion: 1. 80% proximal to mid LAD (lesion was previously treated using a stent). Successful PCI of the LAD using incentive intravascular ultrasound and a single drug-eluting stent to treat a long segment of severe in-stent restenosis. There is a 10% residual stenosis post intervention. 2. 100% ostial OM1. Successful chronic total occlusion intervention of the first OM branch of the left circumflex with extensive balloon angioplasty but inability to pass a stent, 40% residual stenosis with TIMI-3 flow. 3. Congestive heart failure with elevated left and right heart pressures and low cardiac index of 1.9 Recommendations: The patient will continue on aspirin and Plavix. I think he has been revascularized as best as possible via PCI. The patient will be admitted to  advanced heart service for further treatment of his congestive heart failure.  RHC/LHC 01/07/16 (Dr. Marca Ancona): Conclusion: 1. Elevated left and right heart filling pressures with high CVP/PCWP ratio.   2. Pulmonary venous hypertension.  3. Cardiac output acceptable on 2.5 dobutamine.  4. Extensive 3 vessel CAD with suspected hemodynamically significant stenoses in the proximal LAD, distal RCA, and OM1.  (80% proximal LAD. 100% ostial OM1. Patent mid to distal RCA stent. Just beyond the stent in the distal RCA there was 80%. This appeared to involve the ostia of the PLV and PDA with about 70% stenosis.) Recommendations: Needs further diuresis, continue dobutamine.  Place PICC to follow CVP and co-ox. Unable to get MRI for viability given ICD.  I will need to review echo and possibly do a Cardiolite eventually to decide if he would be likely to benefit from revascularization.  Opening RCA would be most straightforward.  Extensive proximal LAD calcification would likely require rotoblation and opening OM1 would be a CTO procedure.   Carotid duplex US 02/16/16: Impressions: Heterogeneous plaque, bilaterally. 40-59% ICA stenosis. Normal SCAs, bilaterally. Patent vertebral arteries with antegrade flow.  1V CXR 04/09/16: IMPRESSION: 1. Small right apical pneumothorax. Recommend follow-up chest x-ray later today to ensure stability or resolution. 2. Right-sided chest tube in place with tip at the level of the right mid lung. 3. Moderate-sized right pleural effusion appears stable, with probable adjacent atelectasis. 4. Cardiomegaly,  stable. 5. Aortic atherosclerosis.  Preoperative labs noted. H/H 13.3/39.8. PLT 158. Cr 1.04. K 3.2 (KCL increased per Dr. Shirlee Latch). BNP 356 (down from 444 on 04/05/16). Glucose 290. A1c 8.9 on 04/10/16.   Patient feels at his baseline today. We discussed that he should be around baseline for surgery and to discuss any acute changes with his HF team. He will follow-up with  Dr. Sharl Ma on Monday to inquire if any additional insulin adjustments needed prior to surgery in hopes fasting CBGs will improve. Dr. Kristin Bruins has discussed surgical risks with Mr. Olander. He also discussed that patient may require overnight observation due to potential risk for increased post-operatively bleeding while on Plavix. PAT RN contacted [employer] rep regarding surgery date/time. I have discussed above with anesthesiologist Dr. Aleene Davidson. Further evaluation by his assigned anesthesiologist on the day of surgery.   Velna Ochs [facility] Short Stay Center/Anesthesiology Phone [phone] 04/21/2016 5:27 PM

## 2016-04-22 ENCOUNTER — Other Ambulatory Visit: Payer: Self-pay | Admitting: Cardiology

## 2016-04-22 ENCOUNTER — Other Ambulatory Visit (HOSPITAL_COMMUNITY): Payer: Self-pay

## 2016-04-22 NOTE — Progress Notes (Signed)
Advanced Heart Failure Clinic Note   PCP: Dr. Sigmund Hazel Cardiology: Dr. Jens Som HF Cardiology: Dr. Shirlee Latch  61 yo smoker with history of PAD, CAD, and ischemic cardiomyopathy presents for CHF clinic evaluation after recent admission.  Patient was admitted in 1/18 with PNA and acute on chronic systolic CHF.  He was markedly volume overloaded and also had low cardiac output requiring dobutamine use.  He had a right pleural effusion and underwent thoracentesis.  Fluid studies suggested a parapneumonic effusion.  He had cardiac cath, showing 3 vessel disease and markedly elevated filling pressures, right and left.  He was evaluated for CABG but turned down given comorbidities and lack of venous conduits.  We were able to wean him off dobutamine and discharged him with plan for outpatient PCI once he had recovered from the current hospitalization.   Admitted 01/28/16 - 02/03/16 after presenting for elective 2 vessel PCI of the LAD and OM of the circumflex. RHC at same time showed advanced HF with low cardiac index and severely elevated pressures. Diuresed with IV lasix, metolazone, and milrinone, but refused PICC line to follow coox/CVP. Underwent thoracentesis 02/01/16 with 1.8 L out. Overall diuresed 16 lbs. Discharge weight 214 lbs.  He was admitted in 4/18 with low output HF, started on milrinone.  Unable to titrate off milrinone and is now on at home.  He had a right chronic transudative effusion, PleurX catheter was placed.    He says that he feels significantly better on milrinone.  In terms in breathing, feels best that he has felt in 2 years. Still fatigues easily.  Short of breath with inclines but not flat ground.  No orthopnea/PND. Still smoking but cutting back, down to 1/2 ppd.    Labs (1/18): K 4.2, creatinine 0.9, hgb 12.6 Labs (2/18): K 4.2, creatinine 1.23, hgb 12.4, BNP 321 Labs (4/18): hgbA1c 8.9, TSH elevated but free T4 normal.  K 3.2, creatinine 0.8  ECG (personally reviewed):  NSR, IVCD 142 msec  PMH: 1. PAD: Left fem-pop bypass with graft in 2017.  2. CAD: Anterior MI 2005 => PCI to LAD followed by staged PCI to LCx and RCA.   - LHC (1/18): Diffuse up to 80% calcified stenosis throughout the proximal LAD.  Diffuse disease in distal LAD up to 50%.  Large OM1 totally occluded at the ostium but reconstituted via collaterals.  80% distal RCA stenosis involving the ostia of the PLV and PDA with about 70% stenosis. Patient had PCI with DES to proximal LAD and PTCA OM1 (unable to place stent).  3. Chronic systolic CHF: Ischemic cardiomyopathy.  St Jude ICD.  - Echo (1/18) with EF 20%, mildly decreased RV systolic function.  - RHC (1/18) with mean RA 21, PA 69/31 mean 45, mean PCWP 27, CI 2.37, PVR 3.3 WU.  - Repeat RHC (1/18) with mean RA 16, mean PCWP 21, CI 1.92 - Echo (3/18) with EF 20-25%, mild MR, mild RV dilation with normal systolic function, PASP 47 mmHg.  - 4/18 admission with low output HF and milrinone gtt started.  4. Carotid stenosis: Carotid dopplers (8/16) with 60-79% RICA, 40-59% LICA.   - Carotid dopplers (2/18) with 40-59% RICA stenosis.  5. Type II diabetes. 6. Hyperlipidemia. 7. HTN 8. Active smoker. 9. Chronic right pleural effusion (transudative) with PleurX catheter.   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  Smoking around 1/2 ppd (has cut back).  Occasional ETOH.   Family History  Problem Relation Age of  Onset  . Diabetes Mother   . Coronary artery disease Father   . Heart disease Father     before age 33   ROS: All systems reviewed and negative except as per HPI.   Current Outpatient Prescriptions  Medication Sig Dispense Refill  . aspirin 81 MG tablet Take 1 tablet (81 mg total) by mouth daily. 30 tablet 3  . atorvastatin (LIPITOR) 80 MG tablet Take 1 tablet (80 mg total) by mouth every other day. 30 tablet 11  . clopidogrel (PLAVIX) 75 MG tablet Take 1 tablet (75 mg total) by mouth daily. 30 tablet 6  . digoxin  (LANOXIN) 0.125 MG tablet Take 1 tablet (0.125 mg total) by mouth daily. 30 tablet 11  . ezetimibe (ZETIA) 10 MG tablet TAKE ONE TABLET BY MOUTH ONCE DAILY 30 tablet 6  . insulin NPH-regular Human (NOVOLIN 70/30) (70-30) 100 UNIT/ML injection Inject 10-30 Units into the skin 3 (three) times daily. 30 units in the morning, 20 units in the afternoon, 10 units in the evening    . milrinone (PRIMACOR) 20 MG/100 ML SOLN infusion Inject 24.5 mcg/min into the vein continuous. 100 mL 0  . potassium chloride SA (K-DUR,KLOR-CON) 20 MEQ tablet Take 1 tablet (20 mEq total) by mouth daily. Take extra 2 meq (1 tablet) on Saturdays with metolazone. (Patient taking differently: Take 20 mEq by mouth daily. ) 30 tablet 6  . spironolactone (ALDACTONE) 25 MG tablet Take 0.5 tablets (12.5 mg total) by mouth daily. 15 tablet 6  . torsemide (DEMADEX) 20 MG tablet Take 4 tablets (80 mg total) by mouth 2 (two) times daily. 240 tablet 6   No current facility-administered medications for this encounter.    BP 124/68 (BP Location: Left Arm, Patient Position: Sitting, Cuff Size: Normal)   Pulse 89   Wt 203 lb 12.8 oz (92.4 kg)   SpO2 100%   BMI 27.64 kg/m    Wt Readings from Last 3 Encounters:  04/21/16 203 lb (92.1 kg)  04/21/16 203 lb 12.8 oz (92.4 kg)  04/10/16 211 lb 9.6 oz (96 kg)    General: NAD Neck: JVP not elevated, no thyromegaly or thyroid nodule.  Lungs: Decreased breath sounds right base.   CV: Nondisplaced PMI.  Heart regular S1/S2, no S3/S4, 1/6 early SEM RUSB.  No carotid bruit.  Unable to palpate pedal pulses. Abdomen: Soft, NT, ND, no HSM. No bruits or masses. +BS  Skin: Chronic lower leg discoloration suggestive of venous stasis dermatitis. No ankle edema. Neurologic: Alert and oriented x 3.  Psych: Normal affect. Extremities: No clubbing or cyanosis.  HEENT: Normal.   Assessment/Plan: 1. Chronic systolic CHF: Ischemic cardiomyopathy.  Echo (1/18) with EF 20%, mildly decreased RV systolic  function, Echo in 3/18 with EF 20-25%.  He did not have much effect from 12/17 PCI in terms of symptoms or EF.  St Jude ICD.  On exam today, he is not volume overloaded.  NYHA class III symptoms, improved on milrinone.  - Continue milrinone 0.25 mcg/kg/min - Continue torsemide 80 mg bid.  BMET/BNP today. - Continue digoxin 0.125 mg daily, check level today.  - He is now off losartan and Coreg with hypotension and low output.   - Continue spironolactone 12.5 mg daily. - He will need a repeat RHC on milrinone, set up for next week.  He understands risks/benefits of procedure and agrees to it.  - He is now dependent on home milrinone.  He feels better on milrinone compared to off.  We are considering him for LVAD, currently undergoing workup and will see surgeon next week. Not current transplant candidate with smoking. Father will be primary caregiver and daughter will be secondary.  He is working on quitting tobacco, he is down to 1/2 ppd from 1 ppd.  I told him that I would like him off cigarettes prior to surgery.  Would hold off on surgery until he has been 3 months on Plavix (4/18 at earliest).   2. CAD: 3 vessel CAD, s/p stent to proximal LAD and PTCA to subtotally occluded large OM1 in 1/18. No change in symptoms or EF with intervention.  - Continue plavix 75 mg daily. Continue ASA 81 and atorvastatin 80 mg daily.   - Check lipids today.   3. PAD: Stable claudication, notes walking up a hill.  - He follows with [facility]. No change to current plans.  4. Carotid stenosis:  Repeat carotid dopplers in 2/19.  5. R Pleural effusion: Chronic, transudate, has PleurX catheter in place. Remove when drainage decreases.   Followup in 3 wks.  Continue LVAD workup.   Marca Ancona 04/22/2016

## 2016-04-24 ENCOUNTER — Encounter (HOSPITAL_COMMUNITY): Payer: BLUE CROSS/BLUE SHIELD

## 2016-04-24 ENCOUNTER — Encounter (HOSPITAL_COMMUNITY)
Admission: RE | Admit: 2016-04-24 | Payer: BLUE CROSS/BLUE SHIELD | Source: Ambulatory Visit | Attending: Cardiovascular Disease | Admitting: Cardiovascular Disease

## 2016-04-25 ENCOUNTER — Other Ambulatory Visit (HOSPITAL_COMMUNITY): Payer: Self-pay | Admitting: Cardiology

## 2016-04-25 ENCOUNTER — Ambulatory Visit: Payer: BLUE CROSS/BLUE SHIELD | Admitting: Vascular Surgery

## 2016-04-25 ENCOUNTER — Encounter (HOSPITAL_COMMUNITY): Payer: BLUE CROSS/BLUE SHIELD

## 2016-04-25 DIAGNOSIS — I739 Peripheral vascular disease, unspecified: Secondary | ICD-10-CM

## 2016-04-25 DIAGNOSIS — I679 Cerebrovascular disease, unspecified: Secondary | ICD-10-CM

## 2016-04-25 DIAGNOSIS — I5022 Chronic systolic (congestive) heart failure: Secondary | ICD-10-CM

## 2016-04-25 DIAGNOSIS — I10 Essential (primary) hypertension: Secondary | ICD-10-CM

## 2016-04-25 DIAGNOSIS — Z95810 Presence of automatic (implantable) cardiac defibrillator: Secondary | ICD-10-CM

## 2016-04-25 DIAGNOSIS — I2510 Atherosclerotic heart disease of native coronary artery without angina pectoris: Secondary | ICD-10-CM

## 2016-04-25 DIAGNOSIS — F17200 Nicotine dependence, unspecified, uncomplicated: Secondary | ICD-10-CM

## 2016-04-25 MED ORDER — EZETIMIBE 10 MG PO TABS: 10.0000 mg | ORAL_TABLET | Freq: Every day | ORAL | 11 refills | Status: DC

## 2016-04-25 MED ORDER — ATORVASTATIN CALCIUM 80 MG PO TABS
80.0000 mg | ORAL_TABLET | ORAL | 11 refills | Status: DC
Start: 2016-04-25 — End: 2016-06-22

## 2016-04-26 ENCOUNTER — Encounter (HOSPITAL_COMMUNITY): Payer: BLUE CROSS/BLUE SHIELD

## 2016-04-26 ENCOUNTER — Ambulatory Visit: Payer: BLUE CROSS/BLUE SHIELD | Admitting: Vascular Surgery

## 2016-04-26 ENCOUNTER — Ambulatory Visit (HOSPITAL_COMMUNITY)
Admission: RE | Admit: 2016-04-26 | Discharge: 2016-04-26 | Disposition: A | Discharge status: Home / Self Care | Payer: BLUE CROSS/BLUE SHIELD | Source: Ambulatory Visit | Attending: Dentistry | Admitting: Dentistry

## 2016-04-26 ENCOUNTER — Other Ambulatory Visit (HOSPITAL_COMMUNITY): Payer: BLUE CROSS/BLUE SHIELD

## 2016-04-26 ENCOUNTER — Ambulatory Visit (HOSPITAL_COMMUNITY): Payer: BLUE CROSS/BLUE SHIELD | Admitting: Vascular Surgery

## 2016-04-26 ENCOUNTER — Ambulatory Visit (HOSPITAL_COMMUNITY): Payer: BLUE CROSS/BLUE SHIELD | Admitting: Certified Registered Nurse Anesthetist

## 2016-04-26 ENCOUNTER — Encounter (HOSPITAL_COMMUNITY): Payer: Self-pay | Admitting: *Deleted

## 2016-04-26 ENCOUNTER — Encounter (HOSPITAL_COMMUNITY)
Admission: RE | Disposition: A | Discharge status: Home / Self Care | Payer: Self-pay | Source: Ambulatory Visit | Attending: Dentistry

## 2016-04-26 DIAGNOSIS — Z9189 Other specified personal risk factors, not elsewhere classified: Secondary | ICD-10-CM

## 2016-04-26 DIAGNOSIS — E119 Type 2 diabetes mellitus without complications: Secondary | ICD-10-CM | POA: Diagnosis not present

## 2016-04-26 DIAGNOSIS — I2510 Atherosclerotic heart disease of native coronary artery without angina pectoris: Secondary | ICD-10-CM | POA: Diagnosis not present

## 2016-04-26 DIAGNOSIS — I110 Hypertensive heart disease with heart failure: Secondary | ICD-10-CM | POA: Insufficient documentation

## 2016-04-26 DIAGNOSIS — K083 Retained dental root: Secondary | ICD-10-CM

## 2016-04-26 DIAGNOSIS — F17200 Nicotine dependence, unspecified, uncomplicated: Secondary | ICD-10-CM | POA: Insufficient documentation

## 2016-04-26 DIAGNOSIS — I509 Heart failure, unspecified: Secondary | ICD-10-CM | POA: Insufficient documentation

## 2016-04-26 DIAGNOSIS — K029 Dental caries, unspecified: Secondary | ICD-10-CM | POA: Diagnosis present

## 2016-04-26 DIAGNOSIS — Z955 Presence of coronary angioplasty implant and graft: Secondary | ICD-10-CM | POA: Insufficient documentation

## 2016-04-26 DIAGNOSIS — I255 Ischemic cardiomyopathy: Secondary | ICD-10-CM

## 2016-04-26 DIAGNOSIS — K0530 Chronic periodontitis, unspecified: Secondary | ICD-10-CM

## 2016-04-26 DIAGNOSIS — E785 Hyperlipidemia, unspecified: Secondary | ICD-10-CM | POA: Diagnosis not present

## 2016-04-26 DIAGNOSIS — K045 Chronic apical periodontitis: Secondary | ICD-10-CM | POA: Insufficient documentation

## 2016-04-26 DIAGNOSIS — Z794 Long term (current) use of insulin: Secondary | ICD-10-CM | POA: Insufficient documentation

## 2016-04-26 HISTORY — PX: MULTIPLE EXTRACTIONS WITH ALVEOLOPLASTY: SHX5342

## 2016-04-26 LAB — GLUCOSE, CAPILLARY
GLUCOSE-CAPILLARY: 202 mg/dL — AB (ref 65–99)
Glucose-Capillary: 191 mg/dL — ABNORMAL HIGH (ref 65–99)

## 2016-04-26 SURGERY — MULTIPLE EXTRACTION WITH ALVEOLOPLASTY
Anesthesia: General

## 2016-04-26 MED ORDER — OXYCODONE-ACETAMINOPHEN 5-325 MG PO TABS
1.0000 | ORAL_TABLET | ORAL | Status: DC | PRN
  Administered 2016-04-26: 1 via ORAL

## 2016-04-26 MED ORDER — ARTIFICIAL TEARS OPHTHALMIC OINT
TOPICAL_OINTMENT | OPHTHALMIC | Status: AC
  Filled 2016-04-26: qty 3.5

## 2016-04-26 MED ORDER — AMINOCAPROIC ACID SOLUTION 5% (50 MG/ML)
10.0000 mL | ORAL | Status: DC
  Administered 2016-04-26: 10 mL via ORAL
  Filled 2016-04-26: qty 100

## 2016-04-26 MED ORDER — PHENYLEPHRINE HCL 10 MG/ML IJ SOLN
INTRAMUSCULAR | Status: DC | PRN
  Administered 2016-04-26 (×5): 40 ug via INTRAVENOUS

## 2016-04-26 MED ORDER — SUGAMMADEX SODIUM 200 MG/2ML IV SOLN
INTRAVENOUS | Status: DC | PRN
  Administered 2016-04-26: 200 mg via INTRAVENOUS

## 2016-04-26 MED ORDER — LACTATED RINGERS IV SOLN: INTRAVENOUS | Status: DC

## 2016-04-26 MED ORDER — MIDAZOLAM HCL 5 MG/5ML IJ SOLN
INTRAMUSCULAR | Status: DC | PRN
  Administered 2016-04-26: 1 mg via INTRAVENOUS

## 2016-04-26 MED ORDER — PROPOFOL 10 MG/ML IV BOLUS
INTRAVENOUS | Status: AC
  Filled 2016-04-26: qty 20

## 2016-04-26 MED ORDER — OXYCODONE HCL 5 MG PO TABS: 5.0000 mg | ORAL_TABLET | Freq: Once | ORAL | Status: DC | PRN

## 2016-04-26 MED ORDER — ONDANSETRON HCL 4 MG/2ML IJ SOLN
INTRAMUSCULAR | Status: AC
  Filled 2016-04-26: qty 4

## 2016-04-26 MED ORDER — OXYCODONE-ACETAMINOPHEN 5-325 MG PO TABS
ORAL_TABLET | ORAL | Status: AC
  Filled 2016-04-26: qty 1

## 2016-04-26 MED ORDER — LACTATED RINGERS IV SOLN
INTRAVENOUS | Status: DC
  Administered 2016-04-26: 08:00:00 via INTRAVENOUS

## 2016-04-26 MED ORDER — LIDOCAINE-EPINEPHRINE 2 %-1:100000 IJ SOLN
INTRAMUSCULAR | Status: AC
  Filled 2016-04-26: qty 10.2

## 2016-04-26 MED ORDER — ROCURONIUM BROMIDE 100 MG/10ML IV SOLN
INTRAVENOUS | Status: DC | PRN
  Administered 2016-04-26: 50 mg via INTRAVENOUS

## 2016-04-26 MED ORDER — PHENYLEPHRINE 40 MCG/ML (10ML) SYRINGE FOR IV PUSH (FOR BLOOD PRESSURE SUPPORT)
PREFILLED_SYRINGE | INTRAVENOUS | Status: AC
  Filled 2016-04-26: qty 20

## 2016-04-26 MED ORDER — FENTANYL CITRATE (PF) 100 MCG/2ML IJ SOLN
25.0000 ug | INTRAMUSCULAR | Status: DC | PRN
  Administered 2016-04-26: 25 ug via INTRAVENOUS
  Administered 2016-04-26: 50 ug via INTRAVENOUS

## 2016-04-26 MED ORDER — FENTANYL CITRATE (PF) 100 MCG/2ML IJ SOLN
INTRAMUSCULAR | Status: DC | PRN
  Administered 2016-04-26: 100 ug via INTRAVENOUS
  Administered 2016-04-26 (×2): 50 ug via INTRAVENOUS
  Administered 2016-04-26 (×2): 25 ug via INTRAVENOUS

## 2016-04-26 MED ORDER — OXYCODONE HCL 5 MG/5ML PO SOLN: 5.0000 mg | Freq: Once | ORAL | Status: DC | PRN

## 2016-04-26 MED ORDER — ROCURONIUM BROMIDE 10 MG/ML (PF) SYRINGE
PREFILLED_SYRINGE | INTRAVENOUS | Status: AC
  Filled 2016-04-26: qty 10

## 2016-04-26 MED ORDER — LIDOCAINE-EPINEPHRINE 2 %-1:100000 IJ SOLN
INTRAMUSCULAR | Status: DC | PRN
  Administered 2016-04-26: 10.2 mL via INTRADERMAL

## 2016-04-26 MED ORDER — MEPERIDINE HCL 25 MG/ML IJ SOLN: 6.2500 mg | INTRAMUSCULAR | Status: DC | PRN

## 2016-04-26 MED ORDER — MIDAZOLAM HCL 2 MG/2ML IJ SOLN
INTRAMUSCULAR | Status: AC
  Filled 2016-04-26: qty 2

## 2016-04-26 MED ORDER — ONDANSETRON HCL 4 MG/2ML IJ SOLN: 4.0000 mg | Freq: Once | INTRAMUSCULAR | Status: DC | PRN

## 2016-04-26 MED ORDER — CEFAZOLIN SODIUM-DEXTROSE 2-4 GM/100ML-% IV SOLN
2.0000 g | Freq: Once | INTRAVENOUS | Status: AC
  Administered 2016-04-26: 2 g via INTRAVENOUS
  Filled 2016-04-26: qty 100

## 2016-04-26 MED ORDER — ONDANSETRON HCL 4 MG/2ML IJ SOLN
INTRAMUSCULAR | Status: AC
  Filled 2016-04-26: qty 2

## 2016-04-26 MED ORDER — SUCCINYLCHOLINE CHLORIDE 200 MG/10ML IV SOSY
PREFILLED_SYRINGE | INTRAVENOUS | Status: AC
  Filled 2016-04-26: qty 10

## 2016-04-26 MED ORDER — ETOMIDATE 2 MG/ML IV SOLN
INTRAVENOUS | Status: DC | PRN
  Administered 2016-04-26: 20 mg via INTRAVENOUS

## 2016-04-26 MED ORDER — OXYCODONE-ACETAMINOPHEN 5-325 MG PO TABS: ORAL_TABLET | ORAL | 0 refills | Status: DC

## 2016-04-26 MED ORDER — LIDOCAINE HCL (CARDIAC) 20 MG/ML IV SOLN
INTRAVENOUS | Status: DC | PRN
  Administered 2016-04-26: 80 mg via INTRAVENOUS

## 2016-04-26 MED ORDER — EPHEDRINE 5 MG/ML INJ
INTRAVENOUS | Status: AC
  Filled 2016-04-26: qty 10

## 2016-04-26 MED ORDER — LIDOCAINE 2% (20 MG/ML) 5 ML SYRINGE
INTRAMUSCULAR | Status: AC
  Filled 2016-04-26: qty 10

## 2016-04-26 MED ORDER — SUGAMMADEX SODIUM 200 MG/2ML IV SOLN
INTRAVENOUS | Status: AC
  Filled 2016-04-26: qty 4

## 2016-04-26 MED ORDER — BUPIVACAINE-EPINEPHRINE 0.5% -1:200000 IJ SOLN
INTRAMUSCULAR | Status: DC | PRN
  Administered 2016-04-26: 3.4 mL

## 2016-04-26 MED ORDER — ONDANSETRON HCL 4 MG/2ML IJ SOLN
INTRAMUSCULAR | Status: DC | PRN
Start: 2016-04-26 — End: 2016-04-26
  Administered 2016-04-26: 4 mg via INTRAVENOUS

## 2016-04-26 MED ORDER — FENTANYL CITRATE (PF) 100 MCG/2ML IJ SOLN
INTRAMUSCULAR | Status: AC
  Filled 2016-04-26: qty 2

## 2016-04-26 MED ORDER — LIDOCAINE 2% (20 MG/ML) 5 ML SYRINGE
INTRAMUSCULAR | Status: AC
  Filled 2016-04-26: qty 5

## 2016-04-26 MED ORDER — BUPIVACAINE-EPINEPHRINE (PF) 0.5% -1:200000 IJ SOLN
INTRAMUSCULAR | Status: AC
  Filled 2016-04-26: qty 3.6

## 2016-04-26 MED ORDER — 0.9 % SODIUM CHLORIDE (POUR BTL) OPTIME
TOPICAL | Status: DC | PRN
  Administered 2016-04-26: 1000 mL

## 2016-04-26 MED ORDER — HEMOSTATIC AGENTS (NO CHARGE) OPTIME
TOPICAL | Status: DC | PRN
  Administered 2016-04-26: 1 via TOPICAL

## 2016-04-26 MED ORDER — OXYMETAZOLINE HCL 0.05 % NA SOLN
NASAL | Status: AC
  Filled 2016-04-26: qty 15

## 2016-04-26 MED ORDER — FENTANYL CITRATE (PF) 250 MCG/5ML IJ SOLN
INTRAMUSCULAR | Status: AC
  Filled 2016-04-26: qty 5

## 2016-04-26 SURGICAL SUPPLY — 37 items
ALCOHOL 70% 16 OZ (MISCELLANEOUS) ×3 IMPLANT
ATTRACTOMAT 16X20 MAGNETIC DRP (DRAPES) ×3 IMPLANT
BLADE SURG 15 STRL LF DISP TIS (BLADE) ×2 IMPLANT
BLADE SURG 15 STRL SS (BLADE) ×6
COVER SURGICAL LIGHT HANDLE (MISCELLANEOUS) ×3 IMPLANT
GAUZE PACKING FOLDED 2  STR (GAUZE/BANDAGES/DRESSINGS) ×2
GAUZE PACKING FOLDED 2 STR (GAUZE/BANDAGES/DRESSINGS) ×1 IMPLANT
GAUZE SPONGE 4X4 16PLY XRAY LF (GAUZE/BANDAGES/DRESSINGS) ×3 IMPLANT
GLOVE BIOGEL PI IND STRL 6 (GLOVE) ×1 IMPLANT
GLOVE BIOGEL PI INDICATOR 6 (GLOVE) ×2
GLOVE SURG ORTHO 8.0 STRL STRW (GLOVE) ×3 IMPLANT
GLOVE SURG SS PI 6.0 STRL IVOR (GLOVE) ×3 IMPLANT
GOWN STRL REUS W/ TWL LRG LVL3 (GOWN DISPOSABLE) ×1 IMPLANT
GOWN STRL REUS W/TWL 2XL LVL3 (GOWN DISPOSABLE) ×3 IMPLANT
GOWN STRL REUS W/TWL LRG LVL3 (GOWN DISPOSABLE) ×3
HEMOSTAT SURGICEL 2X14 (HEMOSTASIS) ×3 IMPLANT
KIT BASIN OR (CUSTOM PROCEDURE TRAY) ×3 IMPLANT
KIT ROOM TURNOVER OR (KITS) ×3 IMPLANT
MANIFOLD NEPTUNE WASTE (CANNULA) ×3 IMPLANT
NDL BLUNT 16X1.5 OR ONLY (NEEDLE) ×1 IMPLANT
NEEDLE BLUNT 16X1.5 OR ONLY (NEEDLE) ×3 IMPLANT
NS IRRIG 1000ML POUR BTL (IV SOLUTION) ×3 IMPLANT
PACK EENT II TURBAN DRAPE (CUSTOM PROCEDURE TRAY) ×3 IMPLANT
PAD ARMBOARD 7.5X6 YLW CONV (MISCELLANEOUS) ×3 IMPLANT
SPONGE SURGIFOAM ABS GEL 100 (HEMOSTASIS) IMPLANT
SPONGE SURGIFOAM ABS GEL 12-7 (HEMOSTASIS) IMPLANT
SPONGE SURGIFOAM ABS GEL SZ50 (HEMOSTASIS) IMPLANT
SUCTION FRAZIER HANDLE 10FR (MISCELLANEOUS) ×2
SUCTION TUBE FRAZIER 10FR DISP (MISCELLANEOUS) ×1 IMPLANT
SUT CHROMIC 3 0 PS 2 (SUTURE) ×10 IMPLANT
SUT CHROMIC 4 0 P 3 18 (SUTURE) IMPLANT
SYR 50ML SLIP (SYRINGE) ×3 IMPLANT
TOWEL OR 17X26 10 PK STRL BLUE (TOWEL DISPOSABLE) ×3 IMPLANT
TUBE CONNECTING 12'X1/4 (SUCTIONS) ×1
TUBE CONNECTING 12X1/4 (SUCTIONS) ×2 IMPLANT
WATER TABLETS ICX (MISCELLANEOUS) ×3 IMPLANT
YANKAUER SUCT BULB TIP NO VENT (SUCTIONS) ×3 IMPLANT

## 2016-04-26 NOTE — H&P (Signed)
04/26/2016  Patient:            [name] Date of Birth:  1955/09/03 MRN:                [id number]   BP 113/61   Pulse 82   Temp 98.5 F (36.9 C) (Oral)   SpO2 100%   [name] is a 61 year old male that presents for extraction of remaining teeth with alveoloplasty in the operating room with general anesthesia.  The patient denies any acute medical or dental changes. Please see note from Dr. Shirlee Latch dated 04/21/2016 to act as the H&P for the dental operating room procedure.  Charlynne Pander, DDS   Progress Notes Date of Service: 04/21/2016 3:13 PM Laurey Morale, MD  Heart Failure    Hide copied text   Advanced Heart Failure Clinic Note   PCP: Dr. Sigmund Hazel Cardiology: Dr. Jens Som HF Cardiology: Dr. Shirlee Latch  61 yo smoker with history of PAD, CAD, and ischemic cardiomyopathy presents for CHF clinic evaluation after recent admission.  Patient was admitted in 1/18 with PNA and acute on chronic systolic CHF.  He was markedly volume overloaded and also had low cardiac output requiring dobutamine use.  He had a right pleural effusion and underwent thoracentesis.  Fluid studies suggested a parapneumonic effusion.  He had cardiac cath, showing 3 vessel disease and markedly elevated filling pressures, right and left.  He was evaluated for CABG but turned down given comorbidities and lack of venous conduits.  We were able to wean him off dobutamine and discharged him with plan for outpatient PCI once he had recovered from the current hospitalization.   Admitted 01/28/16 - 02/03/16 after presenting for elective 2 vessel PCI of the LAD and OM of the circumflex. RHC at same time showed advanced HF with low cardiac index and severely elevated pressures. Diuresed with IV lasix, metolazone, and milrinone, but refused PICC line to follow coox/CVP. Underwent thoracentesis 02/01/16 with 1.8 L out. Overall diuresed 16 lbs. Discharge weight 214 lbs.  He was admitted in 4/18 with low output HF,  started on milrinone.  Unable to titrate off milrinone and is now on at home.  He had a right chronic transudative effusion, PleurX catheter was placed.    He says that he feels significantly better on milrinone.  In terms in breathing, feels best that he has felt in 2 years. Still fatigues easily.  Short of breath with inclines but not flat ground.  No orthopnea/PND. Still smoking but cutting back, down to 1/2 ppd.    Labs (1/18): K 4.2, creatinine 0.9, hgb 12.6 Labs (2/18): K 4.2, creatinine 1.23, hgb 12.4, BNP 321 Labs (4/18): hgbA1c 8.9, TSH elevated but free T4 normal.  K 3.2, creatinine 0.8  ECG (personally reviewed): NSR, IVCD 142 msec  PMH: 1. PAD: Left fem-pop bypass with graft in 2017.  2. CAD: Anterior MI 2005 => PCI to LAD followed by staged PCI to LCx and RCA.   - LHC (1/18): Diffuse up to 80% calcified stenosis throughout the proximal LAD.  Diffuse disease in distal LAD up to 50%.  Large OM1 totally occluded at the ostium but reconstituted via collaterals.  80% distal RCA stenosis involving the ostia of the PLV and PDA with about 70% stenosis. Patient had PCI with DES to proximal LAD and PTCA OM1 (unable to place stent).  3. Chronic systolic CHF: Ischemic cardiomyopathy.  St Jude ICD.  - Echo (1/18) with EF 20%, mildly decreased  RV systolic function.  - RHC (1/18) with mean RA 21, PA 69/31 mean 45, mean PCWP 27, CI 2.37, PVR 3.3 WU.  - Repeat RHC (1/18) with mean RA 16, mean PCWP 21, CI 1.92 - Echo (3/18) with EF 20-25%, mild MR, mild RV dilation with normal systolic function, PASP 47 mmHg.  - 4/18 admission with low output HF and milrinone gtt started.  4. Carotid stenosis: Carotid dopplers (8/16) with 60-79% RICA, 40-59% LICA.   - Carotid dopplers (2/18) with 40-59% RICA stenosis.  5. Type II diabetes. 6. Hyperlipidemia. 7. HTN 8. Active smoker. 9. Chronic right pleural effusion (transudative) with PleurX catheter.   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  Smoking around 1/2 ppd (has cut back).  Occasional ETOH.         Family History  Problem Relation Age of Onset  . Diabetes Mother   . Coronary artery disease Father   . Heart disease Father     before age 54   ROS: All systems reviewed and negative except as per HPI.   Current Outpatient Prescriptions  Medication Sig Dispense Refill  . aspirin 81 MG tablet Take 1 tablet (81 mg total) by mouth daily. 30 tablet 3  . atorvastatin (LIPITOR) 80 MG tablet Take 1 tablet (80 mg total) by mouth every other day. 30 tablet 11  . clopidogrel (PLAVIX) 75 MG tablet Take 1 tablet (75 mg total) by mouth daily. 30 tablet 6  . digoxin (LANOXIN) 0.125 MG tablet Take 1 tablet (0.125 mg total) by mouth daily. 30 tablet 11  . ezetimibe (ZETIA) 10 MG tablet TAKE ONE TABLET BY MOUTH ONCE DAILY 30 tablet 6  . insulin NPH-regular Human (NOVOLIN 70/30) (70-30) 100 UNIT/ML injection Inject 10-30 Units into the skin 3 (three) times daily. 30 units in the morning, 20 units in the afternoon, 10 units in the evening    . milrinone (PRIMACOR) 20 MG/100 ML SOLN infusion Inject 24.5 mcg/min into the vein continuous. 100 mL 0  . potassium chloride SA (K-DUR,KLOR-CON) 20 MEQ tablet Take 1 tablet (20 mEq total) by mouth daily. Take extra 2 meq (1 tablet) on Saturdays with metolazone. (Patient taking differently: Take 20 mEq by mouth daily. ) 30 tablet 6  . spironolactone (ALDACTONE) 25 MG tablet Take 0.5 tablets (12.5 mg total) by mouth daily. 15 tablet 6  . torsemide (DEMADEX) 20 MG tablet Take 4 tablets (80 mg total) by mouth 2 (two) times daily. 240 tablet 6   No current facility-administered medications for this encounter.    BP 124/68 (BP Location: Left Arm, Patient Position: Sitting, Cuff Size: Normal)   Pulse 89   Wt 203 lb 12.8 oz (92.4 kg)   SpO2 100%   BMI 27.64 kg/m       Wt Readings from Last 3 Encounters:  04/21/16 203 lb (92.1 kg)  04/21/16 203 lb 12.8 oz (92.4 kg)  04/10/16 211 lb  9.6 oz (96 kg)    General: NAD Neck: JVP not elevated, no thyromegaly or thyroid nodule.  Lungs: Decreased breath sounds right base.   CV: Nondisplaced PMI.  Heart regular S1/S2, no S3/S4, 1/6 early SEM RUSB.  No carotid bruit.  Unable to palpate pedal pulses. Abdomen: Soft, NT, ND, no HSM. No bruits or masses. +BS  Skin: Chronic lower leg discoloration suggestive of venous stasis dermatitis. No ankle edema. Neurologic: Alert and oriented x 3.  Psych: Normal affect. Extremities: No clubbing or cyanosis.  HEENT: Normal.  Assessment/Plan: 1. Chronic systolic CHF: Ischemic cardiomyopathy.  Echo (1/18) with EF 20%, mildly decreased RV systolic function, Echo in 3/18 with EF 20-25%.  He did not have much effect from 12/17 PCI in terms of symptoms or EF.  St Jude ICD.  On exam today, he is not volume overloaded.  NYHA class III symptoms, improved on milrinone.  - Continue milrinone 0.25 mcg/kg/min - Continue torsemide 80 mg bid.  BMET/BNP today. - Continue digoxin 0.125 mg daily, check level today.  - He is now off losartan and Coreg with hypotension and low output.   - Continue spironolactone 12.5 mg daily. - He will need a repeat RHC on milrinone, set up for next week.  He understands risks/benefits of procedure and agrees to it.  - He is now dependent on home milrinone.  He feels better on milrinone compared to off.  We are considering him for LVAD, currently undergoing workup and will see surgeon next week. Not current transplant candidate with smoking. Father will be primary caregiver and daughter will be secondary.  He is working on quitting tobacco, he is down to 1/2 ppd from 1 ppd.  I told him that I would like him off cigarettes prior to surgery.  Would hold off on surgery until he has been 3 months on Plavix (4/18 at earliest).   2. CAD: 3 vessel CAD, s/p stent to proximal LAD and PTCA to subtotally occluded large OM1 in 1/18. No change in symptoms or EF with intervention.  - Continue  plavix 75 mg daily. Continue ASA 81 and atorvastatin 80 mg daily.   - Check lipids today.   3. PAD: Stable claudication, notes walking up a hill.  - He follows with [facility]. No change to current plans.  4. Carotid stenosis:  Repeat carotid dopplers in 2/19.  5. R Pleural effusion: Chronic, transudate, has PleurX catheter in place. Remove when drainage decreases.   Followup in 3 wks.  Continue LVAD workup.   Marca Ancona 04/22/2016     Electronically signed by Laurey Morale, MD at 04/22/2016 10:33 PM

## 2016-04-26 NOTE — Progress Notes (Signed)
PRE-OPERATIVE NOTE:  04/26/2016 Garyn Arlotta Dittmar [id number]  VITALS: BP 113/61   Pulse 82   Temp 98.5 F (36.9 C) (Oral)   SpO2 100%   Lab Results  Component Value Date   WBC 8.3 04/21/2016   HGB 13.3 04/21/2016   HCT 39.8 04/21/2016   MCV 86.3 04/21/2016   PLT 158 04/21/2016   BMET    Component Value Date/Time   NA 135 04/21/2016 1208   K 3.2 (L) 04/21/2016 1208   CL 91 (L) 04/21/2016 1208   CO2 34 (H) 04/21/2016 1208   GLUCOSE 290 (H) 04/21/2016 1208   BUN 20 04/21/2016 1208   CREATININE 1.04 04/21/2016 1208   CREATININE 0.77 04/05/2015 0950   CALCIUM 9.1 04/21/2016 1208   GFRNONAA >60 04/21/2016 1208   GFRNONAA >89 01/12/2014 0848   GFRAA >60 04/21/2016 1208   GFRAA >89 01/12/2014 0848    Lab Results  Component Value Date   INR 1.07 01/18/2016   INR 1.32 01/07/2016   INR 1.22 11/06/2014   No results found for: PTT   Theophilus Kinds Zellman presents for Extraction of remaining teeth with alveoloplasty in the operating room with general anesthesia.   SUBJECTIVE: The patient denies any acute medical or dental changes and agrees to proceed with treatment as planned.  EXAM: No sign of acute dental changes.  ASSESSMENT: Patient is affected by chronic apical periodontitis, multiple retained root segments, and dental caries.  PLAN: Patient agrees to proceed with treatment as planned in the operating room as previously discussed and accepts the risks, benefits, and complications of the proposed treatment. Patient is aware of the risk for bleeding, bruising, swelling, infection, pain, nerve damage, soft tissue damage, sinus involvement, root tip fracture, mandible fracture, and the risks of complications associated with the anesthesia. Patient also is aware of the potential for other complications up to and including death due to his overall cardiovascular and respiratory compromise.    Charlynne Pander, DDS

## 2016-04-26 NOTE — Progress Notes (Signed)
St Jude Rep Contacted and will be here shortly

## 2016-04-26 NOTE — OR Nursing (Signed)
Dr. Kristin Bruins at the bedside.  He stated he still intended to discharge the patient home after recovery unless the HF team states otherwise.  Dr. Kristin Bruins stated that he notified the HF team the patient was here in pacu.

## 2016-04-26 NOTE — OR Nursing (Signed)
Dr. Shirlee Latch at bs, approves of patient d/c home. Would like to have pleural cath accessed and emptied before d/c home.

## 2016-04-26 NOTE — Discharge Instructions (Signed)

## 2016-04-26 NOTE — Transfer of Care (Signed)
Immediate Anesthesia Transfer of Care Note  Patient: [name]  Procedure(s) Performed: Procedure(s): Extraction of tooth #'s 5-14 and 20-30 with alveoloplasty (N/A)  Patient Location: PACU  Anesthesia Type:General  Level of Consciousness: awake, alert , oriented and patient cooperative  Airway & Oxygen Therapy: Patient Spontanous Breathing and Patient connected to face mask oxygen  Post-op Assessment: Report given to RN and Post -op Vital signs reviewed and stable  Post vital signs: Reviewed and stable  Last Vitals:  Vitals:   04/26/16 0800  BP: 113/61  Pulse: 82  Temp: 36.9 C    Last Pain:  Vitals:   04/26/16 0800  TempSrc: Oral         Complications: No apparent anesthesia complications

## 2016-04-26 NOTE — Op Note (Signed)
OPERATIVE REPORT  Patient:            [name] Date of Birth:  Aug 03, 1955 MRN:                [id number]   DATE OF PROCEDURE:  04/26/2016  PREOPERATIVE DIAGNOSES: 1. Ischemic cardiomyopathy 2. Pre-LVAD dental protocol 3. Chronic apical periodontitis 4. Dental caries 5. Multiple retained root segments 6. Chronic periodontitis  POSTOPERATIVE DIAGNOSES: 1. Ischemic cardiomyopathy 2. Pre-LVAD dental protocol 3. Chronic apical periodontitis 4. Dental caries 5. Multiple retained root segments 6. Chronic periodontitis  OPERATIONS: 1. Multiple extraction of tooth numbers 5 - 14 and 20-30. 2. 4 Quadrants of alveoloplasty   SURGEON: Charlynne Pander, DDS  ASSISTANT: Rory Percy, (dental assistant)  ANESTHESIA: General anesthesia via oral endotracheal tube.  MEDICATIONS: 1. Ancef 2 g IV prior to invasive dental procedures. 2. Local anesthesia with a total utilization of 6 carpules each containing 34 mg of lidocaine with 0.017 mg of epinephrine as well as 2 carpules each containing 9 mg of bupivacaine with 0.009 mg of epinephrine.  SPECIMENS: There are 21 teeth that were discarded.  DRAINS: None  CULTURES: None  COMPLICATIONS: None   ESTIMATED BLOOD LOSS: 100 mLs.  INTRAVENOUS FLUIDS: 300 mLs of Lactated ringers solution.  INDICATIONS: The patient was previously diagnosed with ischemic cardiomyopathy.  A medically necessary dental consultation was then requested to evaluate poor dentition as part of a pre-LVAD dental protocol .  The patient was examined and treatment planned for traction of remaining teeth with alveoloplasty as needed in the operating room with general anesthesia..  This treatment plan was formulated to decrease the risks and complications associated with dental infection from affecting the patient's systemic health and the anticipated LVAD procedure.  OPERATIVE FINDINGS: Patient was examined operating room number 10.  The teeth were identified  for extraction. The patient was noted be affected by chronic apical periodontitis, multiple retained root segments, dental caries, chronic periodontitis..  DESCRIPTION OF PROCEDURE: Patient was brought to the main operating room number 10. Patient was then placed in the supine position on the operating table. General anesthesia was then induced per the anesthesia team. The patient was then prepped and draped in the usual manner for dental medicine procedure. A timeout was performed. The patient was identified and procedures were verified. A throat pack was placed at this time. The oral cavity was then thoroughly examined with the findings noted above. The patient was then ready for dental medicine procedure as follows:  Local anesthesia was then administered sequentially with a total utilization of 6 carpules each containing 34 mg of lidocaine with 0.017 mg of epinephrine as well as 2 carpules  each containing 9 mg bupivacaine with 0.009 mg of epinephrine.  The Maxillary left and right quadrants first approached. Anesthesia was then delivered utilizing infiltration with lidocaine with epinephrine. A #15 blade incision was then made from the distal of #2 and extended to the distal of #15.  A  surgical flap was then carefully reflected. The teeth were then subluxated with a series of straight elevators. Tooth numbers 5-14 were then removed with a 150 forceps or rongeurs without complications. Alveoloplasty was then performed utilizing a ronguers and bone file. The surgical site was then irrigated with copious amounts of sterile saline. The tissues were approximated and trimmed appropriately. A piece of Surgicel was placed in each extraction socket appropriately. The surgical site was then closed from the distal of #2 and extended to the mesial #8  utilizing 3-0 chromic gut suture in a continuous interrupted suture technique 1. The maxillary left surgical site was then closed from the distal of #15 extended to  the mesial #9 utilizing 3-0 chromic gut suture in a continuous interrupted suture technique 1.   At this point time, the mandibular quadrants were approached. The patient was given bilateral inferior alveolar nerve blocks and long buccal nerve blocks utilizing the bupivacaine with epinephrine. Further infiltration was then achieved utilizing the lidocaine with epinephrine. A 15 blade incision was then made from the distal of number 18 and extended to the distal of #32.  A surgical flap was then carefully reflected. The lower teeth were then subluxated with a series straight elevators. Tooth numbers 20 through 30 were then removed utilizing a 151 forceps. Alveoloplasty was then performed utilizing a rongeurs and bone file. The tissues were approximated and trimmed appropriately. The surgical sites were then irrigated with copious amounts of sterile saline. A piece of Surgicel was placed in the extraction sockets appropriately. The mandibular left surgical site was then closed from the distal of  #18 and extended to the mesial #24 utilizing 3-0 chromic gut suture in a continuous interrupted suture technique 1. Mandibular right surgical site was then closed from the distal of #32 and extended the mesial #25 utilizing 3-0 chromic gut suture in a continuous interrupted suture technique 1.   At this point time, the entire mouth was irrigated with copious amounts of sterile saline. The patient was examined for complications, seeing none, the dental medicine procedure was deemed to be complete. The throat pack was removed at this time. A series of 4 x 4 gauze moistened with Amicar 5% rinse were placed in the mouth to aid hemostasis. The patient was then handed over to the anesthesia team for final disposition. After an appropriate amount of time, the patient was extubated and taken to the postanesthsia care unit in good condition. All counts were correct for the dental medicine procedure. Patient will continue the  Amicar 5% rinse post operatively. Patient is to rinse with 10 ML's every hour for 10 hours in a swish and spit manner.  The patient did well with the surgical procedure. The heart failure team was contacted to determine if they wish to have patient admitted for overnight observation, otherwise patient will go home as planned.   Charlynne Pander, DDS.

## 2016-04-26 NOTE — Anesthesia Postprocedure Evaluation (Addendum)
Anesthesia Post Note  Patient: [name]  Procedure(s) Performed: Procedure(s) (LRB): Extraction of tooth #'s 5-14 and 20-30 with alveoloplasty (N/A)  Patient location during evaluation: PACU Anesthesia Type: General Level of consciousness: sedated Pain management: pain level controlled Vital Signs Assessment: post-procedure vital signs reviewed and stable Respiratory status: spontaneous breathing Cardiovascular status: stable Postop Assessment: no signs of nausea or vomiting Anesthetic complications: no        Last Vitals:  Vitals:   04/26/16 1123 04/26/16 1130  BP: 117/62   Pulse: 78 76  Resp: 15 19  Temp: 36.4 C     Last Pain:  Vitals:   04/26/16 1123  TempSrc:   PainSc: Asleep   Pain Goal:                 [name],JOHN Iza Preston

## 2016-04-26 NOTE — Anesthesia Procedure Notes (Signed)
Procedure Name: Intubation Date/Time: 04/26/2016 9:50 AM Performed by: Shirlyn Goltz Pre-anesthesia Checklist: Patient identified, Emergency Drugs available, Suction available and Patient being monitored Patient Re-evaluated:Patient Re-evaluated prior to inductionOxygen Delivery Method: Circle system utilized Preoxygenation: Pre-oxygenation with 100% oxygen Intubation Type: IV induction Ventilation: Mask ventilation without difficulty Laryngoscope Size: Mac and 4 Grade View: Grade I Tube type: Oral Tube size: 7.0 mm Number of attempts: 1 Airway Equipment and Method: Stylet Placement Confirmation: ETT inserted through vocal cords under direct vision,  positive ETCO2 and breath sounds checked- equal and bilateral Secured at: 21 cm Tube secured with: Tape Dental Injury: Teeth and Oropharynx as per pre-operative assessment

## 2016-04-26 NOTE — Anesthesia Preprocedure Evaluation (Addendum)
Anesthesia Evaluation  Patient identified by MRN, date of birth, ID band Patient awake    Reviewed: Allergy & Precautions, NPO status , Patient's Chart, lab work & pertinent test results  Airway Mallampati: II       Dental  (+) Poor Dentition, Missing   Pulmonary Current Smoker,    Pulmonary exam normal        Cardiovascular hypertension, Normal cardiovascular exam Rhythm:Regular Rate:Normal     Neuro/Psych negative psych ROS   GI/Hepatic negative GI ROS, Neg liver ROS,   Endo/Other  diabetes  Renal/GU negative Renal ROS     Musculoskeletal   Abdominal   Peds  Hematology negative hematology ROS (+)   Anesthesia Other Findings Anesthesiology     Hide copied text Anesthesia PAT Evaluation: Patient is a 61 year old male scheduled for multiple teeth extraction on 04/26/16 by Dr. Enrique Sack. He is for anticipated LVAD in the future, and is scheduled to see Dr. Prescott Gum on 05/21/16.  History includes smoking, CAD (a. AWMI requiring IABP 2005 s/p Horizon study stent-LAD, staged BMS-Cx, DESx2-RCA. b. DES LAD and PTCA OM1 08/31/54), chronic systolic CHF, ischemic cardiomyopathy, St. Jude AICD (generator replacement 12/16/12; left chest wall), murmur, right pleural effusion s/p right thoracentesis 01/05/16 and right PleurX catheter 04/07/16, HTN, DM2, hyperlipidemia, carotid artery disease, PVD s/p right FPBG 11/07/13 and left FPBG 11/17/14, appendectomy. Last CHF admission 04/05/16-04/10/16 with AKI in the setting of low output HF. He had right Pleurex catheter inserted and was started on milrinone.  - PCP is Dr. Kathyrn Lass. - Primary cardiologist is Dr. Kirk Ruths. - HF Cardiologist is Dr. Loralie Champagne, last visit 04/21/16 (office note still pending). He is staying of Plavix per discussion with Dr. Enrique Sack. He is scheduled for follow-up RHC 05/01/16. ([place] 01/07/16 with mean RA 21, PA 69/31 mean 45, mean PCWP 27, CI 2.37, PVR 3.3 WU.  Repeat RHC 01/28/16 with mean RA 16, mean PCWP 21, CI 1.92.) He is being considered for LVAD, but would not be a transplant candidate at this point with ongoing smoking.  - EP cardiologist is Dr. Virl Axe, last office visit 04/16/15. Last interrogation 02/11/16. He is not pacer dependent. - CT surgeon is Dr. Prescott Gum who is aware of surgery plans.  - Endocrinologist is Dr. Delrae Rend, reportedly last visit 04/17/16 with insulin adjustment to 30 units/20 units/10 unite TID.  - Vascular surgeon is Dr. Deitra Mayo.  Medications include ASA 81 mg, Lipitor, Plavix, digoxin, Zetia, Novolin 70/30, milrinone infusion, KCL, spironolactone 25 mg daily, torsemide 20 mg BID.   BP 125/70   Pulse 80   Temp 36.7 C   Resp 18   Ht 6' (1.829 m)   Wt 203 lb (92.1 kg)   SpO2 100%   BMI 27.53 kg/m   Heart overall regular rhythm with occasional ectopic beast. Lung clear. He is missing several teeth. There is mild ankle edema (R > L). LE skin is hyperemic, scaly. No conversational dyspnea. Patient denied any acute CV/CHF symptoms. His weights have been stable. No chest pain. No SOB at rest. He can lie flat if needed. He drains PleurX about every other day (400cc-125cc-250cc so far this week). HHRN comes weekly to due to milrinone pump and PICC. He denied any issues with anesthesia. He just saw Dr. Buddy Duty this week and insulin was adjusted due to hyperglycemia. His fasting CBGs are still in the 200's (high 200's at times). I advised him to follow-up with Dr. Buddy Duty on Monday if fasting  CBG still that high to inquire if any further adjustments advised in light of upcoming surgery.   EKG 04/21/16 ([facility]): Probable underlying SR, multiple PVCs. LAD, non-specific intra-ventricular conduction block, cannot rule out septal infarct (age undetermined), lateral infarct (age undetermined), inferior infarct, age undetermined. Anterolateral infarct, age undetermined.   Echo 03/24/16: Study Conclusions - Left  ventricle: The cavity size was moderately dilated. Systolic function was severely reduced. The estimated ejection fraction was in the range of 20% to 25%. Severe diffuse hypokinesis with distinct regional wall motion abnormalities. There is akinesis of the entire inferoseptal and apical septal myocardium. There is akinesis of the apicalanterior myocardium. There is akinesis of the midanteroseptal myocardium. There was a reduced contribution of atrial contraction to ventricular filling, due to increased ventricular diastolic pressure or atrial contractile dysfunction. Doppler parameters are consistent with a reversible restrictive pattern, indicative of decreased left ventricular diastolic compliance and/or increased left atrial pressure (grade 3 diastolic dysfunction). Doppler parameters are consistent with high ventricular filling pressure. - Aortic valve: Mildly to moderately calcified annulus. Trileaflet; normal thickness, mildly calcified leaflets. There was trivial regurgitation. - Mitral valve: Calcified annulus. Mild diffuse calcification of the anterior leaflet and posterior leaflet, with mild involvement of chords. There was mild regurgitation. - Left atrium: The atrium was moderately dilated. - Right ventricle: The cavity size was mildly dilated. Wall thickness was normal. Pacer wire or catheter noted in right ventricle. - Atrial septum: There was increased thickness of the septum, consistent with lipomatous hypertrophy. - Pulmonic valve: There was trivial regurgitation. - Pulmonary arteries: PA peak pressure: 47 mm Hg (S). - Line: A venous catheter was visualized in the superior vena cava, with its tip in the right atrium. No abnormal features noted. Impressions: - The right ventricular systolic pressure was increased consistent with moderate pulmonary hypertension.  RHC/LHC 01/28/16 (Dr. Sherren Mocha): Conclusion: 1. 80%  proximal to mid LAD (lesion was previously treated using a stent). Successful PCI of the LAD using incentive intravascular ultrasound and a single drug-eluting stent to treat a long segment of severe in-stent restenosis. There is a 10% residual stenosis post intervention. 2. 100% ostial OM1. Successful chronic total occlusion intervention of the first OM branch of the left circumflex with extensive balloon angioplasty but inability to pass a stent, 40% residual stenosis with TIMI-3 flow. 3. Congestive heart failure with elevated left and right heart pressures and low cardiac index of 1.9 Recommendations: The patient will continue on aspirin and Plavix. I think he has been revascularized as best as possible via PCI. The patient will be admitted to advanced heart service for further treatment of his congestive heart failure.  RHC/LHC 01/07/16 (Dr. Loralie Champagne): Conclusion: 1. Elevated left and right heart filling pressures with high CVP/PCWP ratio.  2. Pulmonary venous hypertension.  3. Cardiac output acceptable on 2.5 dobutamine.  4. Extensive 3 vessel CAD with suspected hemodynamically significant stenoses in the proximal LAD, distal RCA, and OM1. (80% proximal LAD. 100% ostial OM1. Patent mid to distal RCA stent. Just beyond the stent in the distal RCA there was 80%. This appeared to involve the ostia of the PLV and PDA with about 70% stenosis.) Recommendations: Needs further diuresis, continue dobutamine. Place PICC to follow CVP and co-ox. Unable to get MRI for viability given ICD. I will need to review echo and possibly do a Cardiolite eventually to decide if he would be likely to benefit from revascularization. Opening RCA would be most straightforward. Extensive proximal LAD calcification would likely require rotoblation  and opening OM1 would be a CTO procedure.   Carotid duplex US 02/16/16: Impressions: Heterogeneous plaque, bilaterally. 40-59% ICA stenosis. Normal SCAs, bilaterally.  Patent vertebral arteries with antegrade flow.  1V CXR 04/09/16: IMPRESSION: 1. Small right apical pneumothorax. Recommend follow-up chest x-ray later today to ensure stability or resolution. 2. Right-sided chest tube in place with tip at the level of the right mid lung. 3. Moderate-sized right pleural effusion appears stable, with probable adjacent atelectasis. 4. Cardiomegaly, stable. 5. Aortic atherosclerosis.  Preoperative labs noted. H/H 13.3/39.8. PLT 158. Cr 1.04. K 3.2 (KCL increased per Dr. Aundra Dubin). BNP 356 (down from 444 on 04/05/16). Glucose 290. A1c 8.9 on 04/10/16.   Patient feels at his baseline today. We discussed that he should be around baseline for surgery and to discuss any acute changes with his HF team. He will follow-up with Dr. Buddy Duty on Monday to inquire if any additional insulin adjustments needed prior to surgery in hopes fasting CBGs will improve. Dr. Enrique Sack has discussed surgical risks with Mr. Vanduyne. He also discussed that patient may require overnight observation due to potential risk for increased post-operatively bleeding while on Plavix. PAT RN contacted [employer] rep regarding surgery date/time. I have discussed above with anesthesiologist Dr. Therisa Doyne. Further evaluation by his assigned anesthesiologist on the day of surgery.   George Hugh [facility]/Anesthesiology Phone [phone] 04/21/2016 5:27 PM    Electronically signed by Jacinta Shoe, PA-C at 04/21/2016 5:32 PM    Pre-Admission Testing 60 on 04/21/2016      Detailed Report      Reproductive/Obstetrics                            Anesthesia Physical Anesthesia Plan  ASA: IV  Anesthesia Plan: General   Post-op Pain Management:    Induction: Intravenous  Airway Management Planned: Oral ETT  Additional Equipment:   Intra-op Plan:   Post-operative Plan: Extubation in OR  Informed Consent: I have reviewed the patients  History and Physical, chart, labs and discussed the procedure including the risks, benefits and alternatives for the proposed anesthesia with the patient or authorized representative who has indicated his/her understanding and acceptance.     Plan Discussed with:   Anesthesia Plan Comments:                                          Anesthesia Evaluation  Patient identified by MRN, date of birth, ID band Patient awake    Reviewed: Allergy & Precautions, H&P , NPO status , Patient's Chart, lab work & pertinent test results, reviewed documented beta blocker date and time   Airway Mallampati: II  TM Distance: >3 FB Neck ROM: Full    Dental  (+) Dental Advisory Given, Loose, Missing   Pulmonary Current Smoker,    Pulmonary exam normal        Cardiovascular hypertension, Pt. on medications and Pt. on home beta blockers + CAD, + Past MI, + Cardiac Stents, + Peripheral Vascular Disease and +CHF  + pacemaker + Cardiac Defibrillator + Valvular Problems/Murmurs MR  Rhythm:Regular Rate:Normal     Neuro/Psych negative neurological ROS  negative psych ROS   GI/Hepatic negative GI ROS, Neg liver ROS,   Endo/Other  diabetes, Insulin Dependent  Renal/GU negative Renal ROS  negative genitourinary   Musculoskeletal  Abdominal   Peds  Hematology negative hematology ROS (+)   Anesthesia Other Findings Only 2 teeth left, loose.  Reproductive/Obstetrics negative OB ROS                            Anesthesia Physical  Anesthesia Plan  ASA: IV  Anesthesia Plan: MAC   Post-op Pain Management:    Induction:   Airway Management Planned: Simple Face Mask  Additional Equipment: Arterial line  Intra-op Plan:   Post-operative Plan: Extubation in OR and Possible Post-op intubation/ventilation  Informed Consent: I have reviewed the patients History and Physical, chart, labs and discussed the procedure including the risks, benefits and  alternatives for the proposed anesthesia with the patient or authorized representative who has indicated his/her understanding and acceptance.   Dental advisory given  Plan Discussed with: CRNA  Anesthesia Plan Comments: (1. A/C Systolic Heart Failure ICM. RV mildly dilated. ECHO 03/24/2016 EF 25-30%. St Jude ICD Todays CO-OX was 58.5 %. Continue milrinone 0.125 mcg. Volume status elevated. Continue 80 mg IV lasix x2 and give 5 mg metolazone. Renal function stable.   No bb.  2. CAD: RCA.  - LHC (1/18): Diffuse up to 80% calcified stenosis throughout the proximal LAD. Diffuse disease in distal LAD up to 50%. Large OM1 totally occluded at the ostium but reconstituted via collaterals. 80% distal RCA stenosis involving the ostia of the PLV and 1. A/C Systolic Heart Failure ICM. RV mildly dilated. ECHO 03/24/2016 EF 25-30%. St Jude ICD Todays CO-OX was 58.5 %. milrinone 0.125 mcg. . Renal function stable.    2. CAD: RCA.  - LHC (1/18): Diffuse up to 80% calcified stenosis throughout the proximal LAD. Diffuse disease in distal LAD up to 50%. Large OM1 totally occluded at the ostium but reconstituted via collaterals. 80% distal RCA stenosis involving the ostia of the PLV and PDA with about 70% stenosis. Patient had PCI with DES to proximal LAD and PTCA OM1 (unable to place stent).  No CP. Continue statin and aspirin. Off plavix for pleurex catheter today.   3. PVCs- K 3.9 Mag 2.3  4. Hyponatremia- Sodium 130 5. AKI on CKD- on admit creatinine 1.9. Creatinine trending down on milrinone to 0.88.  6. DMII 7.  R Pleural Effusion- On CXR R moderate pleural effusion. He has had 2  thoracentesis 01/03/2016 and 01/05/2016. Off plavix. Planning for pleurex catheter today.)      Anesthesia Quick Evaluation  Anesthesia Quick Evaluation                                   Anesthesia Evaluation  Patient identified by MRN, date of birth, ID band Patient awake    Reviewed: Allergy & Precautions, H&P ,  NPO status , Patient's Chart, lab work & pertinent test results, reviewed documented beta blocker date and time   Airway Mallampati: II  TM Distance: >3 FB Neck ROM: Full    Dental  (+) Dental Advisory Given, Loose, Missing   Pulmonary Current Smoker,    Pulmonary exam normal        Cardiovascular hypertension, Pt. on medications and Pt. on home beta blockers + CAD, + Past MI, + Cardiac Stents, + Peripheral Vascular Disease and +CHF  + pacemaker + Cardiac Defibrillator + Valvular Problems/Murmurs MR  Rhythm:Regular Rate:Normal     Neuro/Psych negative neurological ROS  negative psych ROS  GI/Hepatic negative GI ROS, Neg liver ROS,   Endo/Other  diabetes, Insulin Dependent  Renal/GU negative Renal ROS  negative genitourinary   Musculoskeletal   Abdominal   Peds  Hematology negative hematology ROS (+)   Anesthesia Other Findings Only 2 teeth left, loose.  Reproductive/Obstetrics negative OB ROS                            Anesthesia Physical  Anesthesia Plan  ASA: IV  Anesthesia Plan: MAC   Post-op Pain Management:    Induction:   Airway Management Planned: Simple Face Mask  Additional Equipment: Arterial line  Intra-op Plan:   Post-operative Plan: Extubation in OR and Possible Post-op intubation/ventilation  Informed Consent: I have reviewed the patients History and Physical, chart, labs and discussed the procedure including the risks, benefits and alternatives for the proposed anesthesia with the patient or authorized representative who has indicated his/her understanding and acceptance.   Dental advisory given  Plan Discussed with: CRNA  Anesthesia Plan Comments: (1. A/C Systolic Heart Failure ICM. RV mildly dilated. ECHO 03/24/2016 EF 25-30%. St Jude ICD Todays CO-OX was 58.5 %. Continue milrinone 0.125 mcg. Volume status elevated. Continue 80 mg IV lasix x2 and give 5 mg metolazone. Renal function stable.    No bb.  2. CAD: RCA.  - LHC (1/18): Diffuse up to 80% calcified stenosis throughout the proximal LAD. Diffuse disease in distal LAD up to 50%. Large OM1 totally occluded at the ostium but reconstituted via collaterals. 80% distal RCA stenosis involving the ostia of the PLV and 1. A/C Systolic Heart Failure ICM. RV mildly dilated. ECHO 03/24/2016 EF 25-30%. St Jude ICD Todays CO-OX was 58.5 %. milrinone 0.125 mcg. . Renal function stable.    2. CAD: RCA.  - LHC (1/18): Diffuse up to 80% calcified stenosis throughout the proximal LAD. Diffuse disease in distal LAD up to 50%. Large OM1 totally occluded at the ostium but reconstituted via collaterals. 80% distal RCA stenosis involving the ostia of the PLV and PDA with about 70% stenosis. Patient had PCI with DES to proximal LAD and PTCA OM1 (unable to place stent).  No CP. Continue statin and aspirin. Off plavix for pleurex catheter today.   3. PVCs- K 3.9 Mag 2.3  4. Hyponatremia- Sodium 130 5. AKI on CKD- on admit creatinine 1.9. Creatinine trending down on milrinone to 0.88.  6. DMII 7.  R Pleural Effusion- On CXR R moderate pleural effusion. He has had 2  thoracentesis 01/03/2016 and 01/05/2016. Off plavix. Planning for pleurex catheter today.)      Anesthesia Quick Evaluation

## 2016-04-27 ENCOUNTER — Encounter (HOSPITAL_COMMUNITY): Payer: Self-pay | Admitting: Dentistry

## 2016-04-28 ENCOUNTER — Telehealth (HOSPITAL_COMMUNITY): Payer: Self-pay | Admitting: *Deleted

## 2016-04-28 ENCOUNTER — Encounter (HOSPITAL_COMMUNITY): Payer: BLUE CROSS/BLUE SHIELD

## 2016-04-28 ENCOUNTER — Other Ambulatory Visit (HOSPITAL_COMMUNITY): Payer: Self-pay | Admitting: Cardiology

## 2016-04-28 MED ORDER — POTASSIUM CHLORIDE CRYS ER 20 MEQ PO TBCR: 40.0000 meq | EXTENDED_RELEASE_TABLET | Freq: Every day | ORAL | 6 refills | Status: DC

## 2016-04-28 NOTE — Telephone Encounter (Signed)
Notes recorded by Modesta Messing, CMA on 04/28/2016 at 11:37 AM EDT Pt aware of results and medication change. ------  Notes recorded by Modesta Messing, CMA on 04/21/2016 at 3:44 PM EDT Left VM for pt to call for lab results ------  Notes recorded by Laurey Morale, MD on 04/21/2016 at 2:44 PM EDT Labs ok except K low. Increase total daily KCl by 20 mEq.    Ref Range & Units 7d ago (04/21/16) 2wk ago (04/10/16) 2wk ago (04/09/16)   Sodium 135 - 145 mmol/L 135  132   131     Potassium 3.5 - 5.1 mmol/L 3.2   3.2   3.5    Chloride 101 - 111 mmol/L 91   89   92     CO2 22 - 32 mmol/L 34   32  32    Glucose, Bld 65 - 99 mg/dL 119   147   829     BUN 6 - 20 mg/dL Creatinine, Ser 0.61 - 1.24 mg/dL 5.62  1.30  8.65    Calcium 8.9 - 10.3 mg/dL 9.1  8.7   8.4     GFR calc non Af Amer >60 mL/min >60  >60  >60

## 2016-04-28 NOTE — Telephone Encounter (Signed)
-----   Message from Laurey Morale, MD sent at 04/21/2016  2:44 PM EDT ----- Labs ok except K low.  Increase total daily KCl by 20 mEq.

## 2016-04-29 DIAGNOSIS — K0530 Chronic periodontitis, unspecified: Secondary | ICD-10-CM

## 2016-04-29 DIAGNOSIS — K083 Retained dental root: Secondary | ICD-10-CM | POA: Diagnosis not present

## 2016-04-29 DIAGNOSIS — K029 Dental caries, unspecified: Secondary | ICD-10-CM

## 2016-04-29 DIAGNOSIS — K045 Chronic apical periodontitis: Secondary | ICD-10-CM

## 2016-05-01 ENCOUNTER — Encounter (HOSPITAL_COMMUNITY): Payer: BLUE CROSS/BLUE SHIELD

## 2016-05-01 ENCOUNTER — Encounter (HOSPITAL_COMMUNITY)
Admission: RE | Disposition: A | Discharge status: Home / Self Care | Payer: Self-pay | Source: Ambulatory Visit | Attending: Cardiology

## 2016-05-01 ENCOUNTER — Ambulatory Visit (HOSPITAL_COMMUNITY)
Admission: RE | Admit: 2016-05-01 | Discharge: 2016-05-01 | Disposition: A | Discharge status: Home / Self Care | Payer: BLUE CROSS/BLUE SHIELD | Source: Ambulatory Visit | Attending: Cardiology | Admitting: Cardiology

## 2016-05-01 ENCOUNTER — Other Ambulatory Visit (HOSPITAL_COMMUNITY): Payer: Self-pay | Admitting: *Deleted

## 2016-05-01 ENCOUNTER — Telehealth (HOSPITAL_COMMUNITY): Payer: Self-pay | Admitting: Pharmacist

## 2016-05-01 ENCOUNTER — Encounter (HOSPITAL_COMMUNITY): Payer: Self-pay | Admitting: Cardiology

## 2016-05-01 DIAGNOSIS — Z794 Long term (current) use of insulin: Secondary | ICD-10-CM | POA: Insufficient documentation

## 2016-05-01 DIAGNOSIS — I252 Old myocardial infarction: Secondary | ICD-10-CM | POA: Insufficient documentation

## 2016-05-01 DIAGNOSIS — I2721 Secondary pulmonary arterial hypertension: Secondary | ICD-10-CM | POA: Diagnosis present

## 2016-05-01 DIAGNOSIS — I110 Hypertensive heart disease with heart failure: Secondary | ICD-10-CM | POA: Diagnosis not present

## 2016-05-01 DIAGNOSIS — I2582 Chronic total occlusion of coronary artery: Secondary | ICD-10-CM | POA: Insufficient documentation

## 2016-05-01 DIAGNOSIS — I509 Heart failure, unspecified: Secondary | ICD-10-CM | POA: Diagnosis not present

## 2016-05-01 DIAGNOSIS — E785 Hyperlipidemia, unspecified: Secondary | ICD-10-CM | POA: Insufficient documentation

## 2016-05-01 DIAGNOSIS — Z955 Presence of coronary angioplasty implant and graft: Secondary | ICD-10-CM | POA: Insufficient documentation

## 2016-05-01 DIAGNOSIS — Z7982 Long term (current) use of aspirin: Secondary | ICD-10-CM | POA: Diagnosis not present

## 2016-05-01 DIAGNOSIS — E1151 Type 2 diabetes mellitus with diabetic peripheral angiopathy without gangrene: Secondary | ICD-10-CM | POA: Diagnosis not present

## 2016-05-01 DIAGNOSIS — I6523 Occlusion and stenosis of bilateral carotid arteries: Secondary | ICD-10-CM | POA: Diagnosis not present

## 2016-05-01 DIAGNOSIS — Z7902 Long term (current) use of antithrombotics/antiplatelets: Secondary | ICD-10-CM | POA: Diagnosis not present

## 2016-05-01 DIAGNOSIS — Z8249 Family history of ischemic heart disease and other diseases of the circulatory system: Secondary | ICD-10-CM | POA: Insufficient documentation

## 2016-05-01 DIAGNOSIS — F17210 Nicotine dependence, cigarettes, uncomplicated: Secondary | ICD-10-CM | POA: Diagnosis not present

## 2016-05-01 DIAGNOSIS — I5022 Chronic systolic (congestive) heart failure: Secondary | ICD-10-CM | POA: Insufficient documentation

## 2016-05-01 DIAGNOSIS — I2510 Atherosclerotic heart disease of native coronary artery without angina pectoris: Secondary | ICD-10-CM | POA: Diagnosis not present

## 2016-05-01 DIAGNOSIS — I255 Ischemic cardiomyopathy: Secondary | ICD-10-CM | POA: Insufficient documentation

## 2016-05-01 HISTORY — PX: RIGHT HEART CATH: CATH118263

## 2016-05-01 LAB — POCT I-STAT 3, VENOUS BLOOD GAS (G3P V)
ACID-BASE EXCESS: 5 mmol/L — AB (ref 0.0–2.0)
Acid-Base Excess: 5 mmol/L — ABNORMAL HIGH (ref 0.0–2.0)
BICARBONATE: 30.4 mmol/L — AB (ref 20.0–28.0)
Bicarbonate: 30.9 mmol/L — ABNORMAL HIGH (ref 20.0–28.0)
O2 SAT: 61 %
O2 Saturation: 61 %
PCO2 VEN: 48.9 mmHg (ref 44.0–60.0)
PH VEN: 7.408 (ref 7.250–7.430)
TCO2: 32 mmol/L (ref 0–100)
TCO2: 32 mmol/L (ref 0–100)
pCO2, Ven: 48.3 mmHg (ref 44.0–60.0)
pH, Ven: 7.409 (ref 7.250–7.430)
pO2, Ven: 32 mmHg (ref 32.0–45.0)
pO2, Ven: 32 mmHg (ref 32.0–45.0)

## 2016-05-01 LAB — BASIC METABOLIC PANEL
ANION GAP: 12 (ref 5–15)
BUN: 11 mg/dL (ref 6–20)
CALCIUM: 9.3 mg/dL (ref 8.9–10.3)
CO2: 30 mmol/L (ref 22–32)
CREATININE: 0.84 mg/dL (ref 0.61–1.24)
Chloride: 95 mmol/L — ABNORMAL LOW (ref 101–111)
GFR calc Af Amer: >60 mL/min (ref >60)
GLUCOSE: 194 mg/dL — AB (ref 65–99)
Potassium: 3.2 mmol/L — ABNORMAL LOW (ref 3.5–5.1)
Sodium: 137 mmol/L (ref 135–145)

## 2016-05-01 LAB — PROTIME-INR
INR: 0.97
Prothrombin Time: 12.9 seconds (ref 11.4–15.2)

## 2016-05-01 LAB — GLUCOSE, CAPILLARY: Glucose-Capillary: 205 mg/dL — ABNORMAL HIGH (ref 65–99)

## 2016-05-01 LAB — PLATELET INHIBITION P2Y12: Platelet Function  P2Y12: 84 [PRU] — ABNORMAL LOW (ref 194–418)

## 2016-05-01 SURGERY — RIGHT HEART CATH
Anesthesia: LOCAL

## 2016-05-01 MED ORDER — ASPIRIN 81 MG PO CHEW: 81.0000 mg | CHEWABLE_TABLET | ORAL | Status: AC

## 2016-05-01 MED ORDER — SILDENAFIL CITRATE 20 MG PO TABS: 20.0000 mg | ORAL_TABLET | Freq: Three times a day (TID) | ORAL | 5 refills | Status: DC

## 2016-05-01 MED ORDER — LIDOCAINE HCL (PF) 1 % IJ SOLN
INTRAMUSCULAR | Status: AC
  Filled 2016-05-01: qty 30

## 2016-05-01 MED ORDER — FENTANYL CITRATE (PF) 100 MCG/2ML IJ SOLN
INTRAMUSCULAR | Status: DC | PRN
  Administered 2016-05-01 (×2): 25 ug via INTRAVENOUS

## 2016-05-01 MED ORDER — MIDAZOLAM HCL 2 MG/2ML IJ SOLN
INTRAMUSCULAR | Status: DC | PRN
  Administered 2016-05-01: 1 mg via INTRAVENOUS

## 2016-05-01 MED ORDER — MIDAZOLAM HCL 2 MG/2ML IJ SOLN
INTRAMUSCULAR | Status: AC
  Filled 2016-05-01: qty 2

## 2016-05-01 MED ORDER — SODIUM CHLORIDE 0.9% FLUSH: 3.0000 mL | Freq: Two times a day (BID) | INTRAVENOUS | Status: DC

## 2016-05-01 MED ORDER — HEPARIN (PORCINE) IN NACL 2-0.9 UNIT/ML-% IJ SOLN
INTRAMUSCULAR | Status: DC | PRN
  Administered 2016-05-01: 1000 mL

## 2016-05-01 MED ORDER — SODIUM CHLORIDE 0.9 % IV SOLN: 250.0000 mL | INTRAVENOUS | Status: DC | PRN

## 2016-05-01 MED ORDER — FENTANYL CITRATE (PF) 100 MCG/2ML IJ SOLN
INTRAMUSCULAR | Status: AC
  Filled 2016-05-01: qty 2

## 2016-05-01 MED ORDER — SODIUM CHLORIDE 0.9 % IV SOLN
INTRAVENOUS | Status: DC
  Administered 2016-05-01: 07:00:00 via INTRAVENOUS

## 2016-05-01 MED ORDER — LIDOCAINE HCL (PF) 1 % IJ SOLN
INTRAMUSCULAR | Status: DC | PRN
  Administered 2016-05-01: 15 mL

## 2016-05-01 MED ORDER — HEPARIN (PORCINE) IN NACL 2-0.9 UNIT/ML-% IJ SOLN
INTRAMUSCULAR | Status: AC
  Filled 2016-05-01: qty 1000

## 2016-05-01 MED ORDER — SODIUM CHLORIDE 0.9% FLUSH: 3.0000 mL | INTRAVENOUS | Status: DC | PRN

## 2016-05-01 MED ORDER — POTASSIUM CHLORIDE CRYS ER 20 MEQ PO TBCR
EXTENDED_RELEASE_TABLET | ORAL | Status: AC
  Filled 2016-05-01: qty 2

## 2016-05-01 MED ORDER — ONDANSETRON HCL 4 MG/2ML IJ SOLN: 4.0000 mg | Freq: Four times a day (QID) | INTRAMUSCULAR | Status: DC | PRN

## 2016-05-01 MED ORDER — ACETAMINOPHEN 325 MG PO TABS: 650.0000 mg | ORAL_TABLET | ORAL | Status: DC | PRN

## 2016-05-01 MED ORDER — POTASSIUM CHLORIDE CRYS ER 20 MEQ PO TBCR
40.0000 meq | EXTENDED_RELEASE_TABLET | Freq: Once | ORAL | Status: AC
  Administered 2016-05-01: 40 meq via ORAL

## 2016-05-01 SURGICAL SUPPLY — 6 items
CATH SWAN GANZ 7F STRAIGHT (CATHETERS) ×1 IMPLANT
KIT HEART LEFT (KITS) ×2 IMPLANT
PACK CARDIAC CATHETERIZATION (CUSTOM PROCEDURE TRAY) ×2 IMPLANT
SHEATH PINNACLE 7F 10CM (SHEATH) ×1 IMPLANT
TRANSDUCER W/STOPCOCK (MISCELLANEOUS) ×2 IMPLANT
TUBING CIL FLEX 10 FLL-RA (TUBING) ×2 IMPLANT

## 2016-05-01 NOTE — Telephone Encounter (Signed)
Sildenafil 20 mg TID PA approved by [employer] through 05/01/19.   Tyler Deis. Bonnye Fava, PharmD, BCPS, CPP Clinical Pharmacist Pager: [phone] Phone: [phone] 05/01/2016 4:35 PM

## 2016-05-01 NOTE — H&P (View-Only) (Signed)
Advanced Heart Failure Clinic Note   PCP: Dr. [name] Cardiology: Dr. Crenshaw HF Cardiology: Dr. Tylisha Danis  61 yo smoker with history of PAD, CAD, and ischemic cardiomyopathy presents for CHF clinic evaluation after recent admission.  Patient was admitted in 1/18 with PNA and acute on chronic systolic CHF.  He was markedly volume overloaded and also had low cardiac output requiring dobutamine use.  He had a right pleural effusion and underwent thoracentesis.  Fluid studies suggested a parapneumonic effusion.  He had cardiac cath, showing 3 vessel disease and markedly elevated filling pressures, right and left.  He was evaluated for CABG but turned down given comorbidities and lack of venous conduits.  We were able to wean him off dobutamine and discharged him with plan for outpatient PCI once he had recovered from the current hospitalization.   Admitted 01/28/16 - 02/03/16 after presenting for elective 2 vessel PCI of the LAD and OM of the circumflex. RHC at same time showed advanced HF with low cardiac index and severely elevated pressures. Diuresed with IV lasix, metolazone, and milrinone, but refused PICC line to follow coox/CVP. Underwent thoracentesis 02/01/16 with 1.8 L out. Overall diuresed 16 lbs. Discharge weight 214 lbs.  He was admitted in 4/18 with low output HF, started on milrinone.  Unable to titrate off milrinone and is now on at home.  He had a right chronic transudative effusion, PleurX catheter was placed.    He says that he feels significantly better on milrinone.  In terms in breathing, feels best that he has felt in 2 years. Still fatigues easily.  Short of breath with inclines but not flat ground.  No orthopnea/PND. Still smoking but cutting back, down to 1/2 ppd.    Labs (1/18): K 4.2, creatinine 0.9, hgb 12.6 Labs (2/18): K 4.2, creatinine 1.23, hgb 12.4, BNP 321 Labs (4/18): hgbA1c 8.9, TSH elevated but free T4 normal.  K 3.2, creatinine 0.8  ECG (personally reviewed):  NSR, IVCD 142 msec  PMH: 1. PAD: Left fem-pop bypass with graft in 2017.  2. CAD: Anterior MI 2005 => PCI to LAD followed by staged PCI to LCx and RCA.   - LHC (1/18): Diffuse up to 80% calcified stenosis throughout the proximal LAD.  Diffuse disease in distal LAD up to 50%.  Large OM1 totally occluded at the ostium but reconstituted via collaterals.  80% distal RCA stenosis involving the ostia of the PLV and PDA with about 70% stenosis. Patient had PCI with DES to proximal LAD and PTCA OM1 (unable to place stent).  3. Chronic systolic CHF: Ischemic cardiomyopathy.  St Jude ICD.  - Echo (1/18) with EF 20%, mildly decreased RV systolic function.  - RHC (1/18) with mean RA 21, PA 69/31 mean 45, mean PCWP 27, CI 2.37, PVR 3.3 WU.  - Repeat RHC (1/18) with mean RA 16, mean PCWP 21, CI 1.92 - Echo (3/18) with EF 20-25%, mild MR, mild RV dilation with normal systolic function, PASP 47 mmHg.  - 4/18 admission with low output HF and milrinone gtt started.  4. Carotid stenosis: Carotid dopplers (8/16) with 60-79% RICA, 40-59% LICA.   - Carotid dopplers (2/18) with 40-59% RICA stenosis.  5. Type II diabetes. 6. Hyperlipidemia. 7. HTN 8. Active smoker. 9. Chronic right pleural effusion (transudative) with PleurX catheter.   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  Smoking around 1/2 ppd (has cut back).  Occasional ETOH.   Family History  Problem Relation Age of   Onset  . Diabetes Mother   . Coronary artery disease Father   . Heart disease Father     before age 60   ROS: All systems reviewed and negative except as per HPI.   Current Outpatient Prescriptions  Medication Sig Dispense Refill  . aspirin 81 MG tablet Take 1 tablet (81 mg total) by mouth daily. 30 tablet 3  . atorvastatin (LIPITOR) 80 MG tablet Take 1 tablet (80 mg total) by mouth every other day. 30 tablet 11  . clopidogrel (PLAVIX) 75 MG tablet Take 1 tablet (75 mg total) by mouth daily. 30 tablet 6  . digoxin  (LANOXIN) 0.125 MG tablet Take 1 tablet (0.125 mg total) by mouth daily. 30 tablet 11  . ezetimibe (ZETIA) 10 MG tablet TAKE ONE TABLET BY MOUTH ONCE DAILY 30 tablet 6  . insulin NPH-regular Human (NOVOLIN 70/30) (70-30) 100 UNIT/ML injection Inject 10-30 Units into the skin 3 (three) times daily. 30 units in the morning, 20 units in the afternoon, 10 units in the evening    . milrinone (PRIMACOR) 20 MG/100 ML SOLN infusion Inject 24.5 mcg/min into the vein continuous. 100 mL 0  . potassium chloride SA (K-DUR,KLOR-CON) 20 MEQ tablet Take 1 tablet (20 mEq total) by mouth daily. Take extra 2 meq (1 tablet) on Saturdays with metolazone. (Patient taking differently: Take 20 mEq by mouth daily. ) 30 tablet 6  . spironolactone (ALDACTONE) 25 MG tablet Take 0.5 tablets (12.5 mg total) by mouth daily. 15 tablet 6  . torsemide (DEMADEX) 20 MG tablet Take 4 tablets (80 mg total) by mouth 2 (two) times daily. 240 tablet 6   No current facility-administered medications for this encounter.    BP 124/68 (BP Location: Left Arm, Patient Position: Sitting, Cuff Size: Normal)   Pulse 89   Wt 203 lb 12.8 oz (92.4 kg)   SpO2 100%   BMI 27.64 kg/m    Wt Readings from Last 3 Encounters:  04/21/16 203 lb (92.1 kg)  04/21/16 203 lb 12.8 oz (92.4 kg)  04/10/16 211 lb 9.6 oz (96 kg)    General: NAD Neck: JVP not elevated, no thyromegaly or thyroid nodule.  Lungs: Decreased breath sounds right base.   CV: Nondisplaced PMI.  Heart regular S1/S2, no S3/S4, 1/6 early SEM RUSB.  No carotid bruit.  Unable to palpate pedal pulses. Abdomen: Soft, NT, ND, no HSM. No bruits or masses. +BS  Skin: Chronic lower leg discoloration suggestive of venous stasis dermatitis. No ankle edema. Neurologic: Alert and oriented x 3.  Psych: Normal affect. Extremities: No clubbing or cyanosis.  HEENT: Normal.   Assessment/Plan: 1. Chronic systolic CHF: Ischemic cardiomyopathy.  Echo (1/18) with EF 20%, mildly decreased RV systolic  function, Echo in 3/18 with EF 20-25%.  He did not have much effect from 12/17 PCI in terms of symptoms or EF.  St Jude ICD.  On exam today, he is not volume overloaded.  NYHA class III symptoms, improved on milrinone.  - Continue milrinone 0.25 mcg/kg/min - Continue torsemide 80 mg bid.  BMET/BNP today. - Continue digoxin 0.125 mg daily, check level today.  - He is now off losartan and Coreg with hypotension and low output.   - Continue spironolactone 12.5 mg daily. - He will need a repeat RHC on milrinone, set up for next week.  He understands risks/benefits of procedure and agrees to it.  - He is now dependent on home milrinone.  He feels better on milrinone compared to off.    We are considering him for LVAD, currently undergoing workup and will see surgeon next week. Not current transplant candidate with smoking. Father will be primary caregiver and daughter will be secondary.  He is working on quitting tobacco, he is down to 1/2 ppd from 1 ppd.  I told him that I would like him off cigarettes prior to surgery.  Would hold off on surgery until he has been 3 months on Plavix (4/18 at earliest).   2. CAD: 3 vessel CAD, s/p stent to proximal LAD and PTCA to subtotally occluded large OM1 in 1/18. No change in symptoms or EF with intervention.  - Continue plavix 75 mg daily. Continue ASA 81 and atorvastatin 80 mg daily.   - Check lipids today.   3. PAD: Stable claudication, notes walking up a hill.  - He follows with [facility]. No change to current plans.  4. Carotid stenosis:  Repeat carotid dopplers in 2/19.  5. R Pleural effusion: Chronic, transudate, has PleurX catheter in place. Remove when drainage decreases.   Followup in 3 wks.  Continue LVAD workup.   [name] 04/22/2016  

## 2016-05-01 NOTE — Discharge Instructions (Signed)
Femoral Site Care Refer to this sheet in the next few weeks. These instructions provide you with information about caring for yourself after your procedure. Your health care provider may also give you more specific instructions. Your treatment has been planned according to current medical practices, but problems sometimes occur. Call your health care provider if you have any problems or questions after your procedure. What can I expect after the procedure? After your procedure, it is typical to have the following:  Bruising at the site that usually fades within 1-2 weeks.  Blood collecting in the tissue (hematoma) that may be painful to the touch. It should usually decrease in size and tenderness within 1-2 weeks. Follow these instructions at home:  Take medicines only as directed by your health care provider.  You may shower 24-48 hours after the procedure or as directed by your health care provider. Remove the bandage (dressing) and gently wash the site with plain soap and water. Pat the area dry with a clean towel. Do not rub the site, because this may cause bleeding.  Do not take baths, swim, or use a hot tub until your health care provider approves.  Check your insertion site every day for redness, swelling, or drainage.  Do not apply powder or lotion to the site.  Limit use of stairs to twice a day for the first 2-3 days or as directed by your health care provider.  Do not squat for the first 2 days or as directed by your health care provider.  Do not lift over 10 lb (4.5 kg) for 2 days after your procedure or as directed by your health care provider.  Ask your health care provider when it is okay to:  Return to work or school.  Resume usual physical activities or sports.  Resume sexual activity.  Do not drive home if you are discharged the same day as the procedure. Have someone else drive you.  You may drive 24 hours after the procedure unless otherwise instructed by your  health care provider.  Do not operate machinery or power tools for 24 hours after the procedure or as directed by your health care provider.  If your procedure was done as an outpatient procedure, which means that you went home the same day as your procedure, a responsible adult should be with you for the first 24 hours after you arrive home.  Keep all follow-up visits as directed by your health care provider. This is important. Contact a health care provider if:  You have a fever.  You have chills.  You have increased bleeding from the site. Hold pressure on the site. Get help right away if:  You have unusual pain at the site.  You have redness, warmth, or swelling at the site.  You have drainage (other than a small amount of blood on the dressing) from the site.  The site is bleeding, and the bleeding does not stop after 30 minutes of holding steady pressure on the site.  Your leg or foot becomes pale, cool, tingly, or numb. This information is not intended to replace advice given to you by your health care provider. Make sure you discuss any questions you have with your health care provider. Document Released: 08/22/2013 Document Revised: 05/27/2015 Document Reviewed: 07/08/2013 Elsevier Interactive Patient Education  2017 [employer].

## 2016-05-01 NOTE — Interval H&P Note (Signed)
History and Physical Interval Note:  05/01/2016 7:47 AM  [name]  has presented today for surgery, with the diagnosis of hf  The various methods of treatment have been discussed with the patient and family. After consideration of risks, benefits and other options for treatment, the patient has consented to  Procedure(s): Right Heart Cath (N/A) as a surgical intervention .  The patient's history has been reviewed, patient examined, no change in status, stable for surgery.  I have reviewed the patient's chart and labs.  Questions were answered to the patient's satisfaction.     Rhett Najera [employer]

## 2016-05-02 ENCOUNTER — Other Ambulatory Visit (HOSPITAL_COMMUNITY): Payer: Self-pay | Admitting: Pharmacist

## 2016-05-02 ENCOUNTER — Ambulatory Visit (HOSPITAL_COMMUNITY)
Admission: RE | Admit: 2016-05-02 | Discharge: 2016-05-02 | Disposition: A | Discharge status: Home / Self Care | Payer: BLUE CROSS/BLUE SHIELD | Source: Ambulatory Visit | Attending: Cardiology | Admitting: Cardiology

## 2016-05-02 ENCOUNTER — Telehealth (HOSPITAL_COMMUNITY): Payer: Self-pay | Admitting: Pharmacist

## 2016-05-02 ENCOUNTER — Telehealth: Payer: Self-pay | Admitting: Licensed Clinical Social Worker

## 2016-05-02 DIAGNOSIS — I5022 Chronic systolic (congestive) heart failure: Secondary | ICD-10-CM | POA: Diagnosis present

## 2016-05-02 LAB — PULMONARY FUNCTION TEST
DL/VA % PRED: 60 %
DL/VA: 2.87 ml/min/mmHg/L
DLCO COR % PRED: 46 %
DLCO COR: 16.44 ml/min/mmHg
DLCO unc % pred: 45 %
DLCO unc: 15.8 ml/min/mmHg
FEF 25-75 POST: 0.97 L/s
FEF 25-75 Pre: 1.03 L/sec
FEF2575-%Change-Post: -5 %
FEF2575-%PRED-PRE: 33 %
FEF2575-%Pred-Post: 31 %
FEV1-%Change-Post: -10 %
FEV1-%Pred-Post: 49 %
FEV1-%Pred-Pre: 55 %
FEV1-Post: 1.9 L
FEV1-Pre: 2.11 L
FEV1FVC-%Change-Post: -4 %
FEV1FVC-%Pred-Pre: 78 %
FEV6-%Change-Post: -10 %
FEV6-%PRED-PRE: 73 %
FEV6-%Pred-Post: 65 %
FEV6-Post: 3.18 L
FEV6-Pre: 3.55 L
FEV6FVC-%Change-Post: 0 %
FEV6FVC-%Pred-Post: 105 %
FEV6FVC-%Pred-Pre: 104 %
FVC-%Change-Post: -5 %
FVC-%PRED-POST: 66 %
FVC-%PRED-PRE: 70 %
FVC-POST: 3.37 L
FVC-PRE: 3.58 L
POST FEV6/FVC RATIO: 100 %
PRE FEV1/FVC RATIO: 59 %
Post FEV1/FVC ratio: 56 %
Pre FEV6/FVC Ratio: 99 %
RV % pred: 125 %
RV: 2.99 L
TLC % pred: 90 %
TLC: 6.66 L

## 2016-05-02 MED ORDER — SILDENAFIL CITRATE 20 MG PO TABS: 20.0000 mg | ORAL_TABLET | Freq: Three times a day (TID) | ORAL | 11 refills | Status: DC

## 2016-05-02 MED ORDER — ALBUTEROL SULFATE (2.5 MG/3ML) 0.083% IN NEBU
2.5000 mg | INHALATION_SOLUTION | Freq: Once | RESPIRATORY_TRACT | Status: AC
  Administered 2016-05-02: 2.5 mg via RESPIRATORY_TRACT

## 2016-05-02 NOTE — Telephone Encounter (Signed)
[employer] stated that Mr. Lortie sildenafil has to come from [employer]. I have sent it to [facility] and advised Mr. Hoxworth that they would be giving him a call within the next few days.   Tyler Deis. Bonnye Fava, PharmD, BCPS, CPP Clinical Pharmacist Pager: [phone] Phone: [phone] 05/02/2016 2:18 PM

## 2016-05-02 NOTE — Telephone Encounter (Signed)
CSW contacted patient to schedule LVAD Assessment. Patient will try and meet with CSW on Thursday at 1:30 pm prior to appointment with [facility]. Patient will confirm after confirming his [facility] visit for milrinone. Lasandra Beech, LCSW, CCSW-MCS [phone]

## 2016-05-03 ENCOUNTER — Encounter: Payer: BLUE CROSS/BLUE SHIELD | Admitting: Cardiothoracic Surgery

## 2016-05-03 ENCOUNTER — Encounter (HOSPITAL_COMMUNITY): Payer: BLUE CROSS/BLUE SHIELD

## 2016-05-03 ENCOUNTER — Ambulatory Visit
Admission: RE | Admit: 2016-05-03 | Discharge: 2016-05-03 | Disposition: A | Discharge status: Home / Self Care | Payer: BLUE CROSS/BLUE SHIELD | Source: Ambulatory Visit | Attending: Cardiothoracic Surgery | Admitting: Cardiothoracic Surgery

## 2016-05-03 DIAGNOSIS — J90 Pleural effusion, not elsewhere classified: Secondary | ICD-10-CM

## 2016-05-04 ENCOUNTER — Institutional Professional Consult (permissible substitution) (INDEPENDENT_AMBULATORY_CARE_PROVIDER_SITE_OTHER): Payer: BLUE CROSS/BLUE SHIELD | Admitting: Cardiothoracic Surgery

## 2016-05-04 ENCOUNTER — Encounter: Payer: Self-pay | Admitting: Licensed Clinical Social Worker

## 2016-05-04 ENCOUNTER — Encounter: Payer: Self-pay | Admitting: Cardiothoracic Surgery

## 2016-05-04 ENCOUNTER — Encounter (HOSPITAL_COMMUNITY): Payer: Self-pay

## 2016-05-04 VITALS — BP 117/71 | HR 89 | Resp 16 | Ht 72.0 in | Wt 200.0 lb

## 2016-05-04 DIAGNOSIS — Z9689 Presence of other specified functional implants: Secondary | ICD-10-CM | POA: Diagnosis not present

## 2016-05-04 DIAGNOSIS — J90 Pleural effusion, not elsewhere classified: Secondary | ICD-10-CM | POA: Diagnosis not present

## 2016-05-04 DIAGNOSIS — I5022 Chronic systolic (congestive) heart failure: Secondary | ICD-10-CM | POA: Diagnosis not present

## 2016-05-04 DIAGNOSIS — I255 Ischemic cardiomyopathy: Secondary | ICD-10-CM

## 2016-05-04 DIAGNOSIS — I2510 Atherosclerotic heart disease of native coronary artery without angina pectoris: Secondary | ICD-10-CM

## 2016-05-04 NOTE — Progress Notes (Signed)
CSW met with patient in the clinic to further discuss the VAD assessment. Patient states his father will be the primary caregiver and has his sister and children as back ups. Patient answered questions appropriately and states committment to stop smoking prior to surgery. Patient will meet with Dr. Darcey Nora this afternoon to further discuss the pump and surgery. CSW will complete assessment process next week after speaking with patient's father who will be primary caregiver. Full assessment to follow upon completion of caregiver part. Raquel Sarna, [place], [place]

## 2016-05-04 NOTE — Progress Notes (Signed)
[name] Disability claim completed on behalf of patient to remain out of work 3 months pending possible LVAD implant. 34 total pages including forms and requested documents faxed to provided # [phone] Copy of forms scanned into patient's electronic medical record. Patient also in clinic today to pick up copy of forms.  [name], Dalton Kruczek [name], RN

## 2016-05-04 NOTE — Progress Notes (Signed)
PCP is [name], MD Referring Provider is Bensimhon, Bevelyn Buckles, MD  Chief Complaint  Patient presents with  . 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 He was turned down for CABG in January because of poor LV function, lack of conduit from bilateral previous femoropopliteal bypass grafts and poor overall health. He underwent PCI of LAD and PTCA of circumflex and was placed on Plavix. He had a right thoracentesis which removed 1.4 L but with early recurrence for which she underwent a right Pleurx catheter placement a month ago. The patient has been followed by Dr. Shirlee Latch at the advanced heart failure clinic and his been on 0.25 milrinone since January and has felt somewhat improved. He remains ambulatory, his edema has been controlled however he has fatigue and decreased exercise tolerance. His weight has been stable. Echo shows LV diameter 6 cm mild MR and mild TR no significant AI aortic root appears normal. RV function looks adequate no apical thrombus or PFO  Patient is undergoing evaluation for LVAD placement as destination therapy indication. He lives alone however his father is his primary caregiver and lives close by. His father has been assisting the patient with his Pleurx catheter care.  The last right heart cath [on milrinone[  demonstrates CVP of 5, wedge 17, RV 57/8 PA sat 61% cardiac index 2.1. Chest CT scan shows right Pleurx catheter with a partially  loculated right pleural effusion, no at risk mediastinal nodes or pulmonary nodules. The patient has not had endoscopy or colonoscopy-he was deemed at too high risk by gastroenterology. The patient had a colonoscopy 7 years ago which was unremarkable. He denies GI bleeding. He has had no significant bleeding from his Plavix started after the recent LAD stent. His last P2 Y 12 assay is significantly depressed at 84. He is definitely a Plavix responder. The patient recently underwent extraction of necrotic teeth by the dental service and tolerated that well. His stitches are still in place but is going lines are healing nicely. The patient has had Pulmonary function testing demonstrating adequate mechanics with a depressed diffusion capacity of 46%. The patient is able to ambulate in the hall of the office 200 feet without dropping his room air saturation from 97%. The patient has bilateral venous stasis skin changes-dermatitis with some eschar ulcerated areas not infected. The Pleurx catheter site is clean without drainage. The PICC line site in his right arm appears to be clean and dry. The patient's diabetes is somewhat controlled with last A1c of 8.9. He has no evidence of hypercoagulability or factor V Leiden mutation. He states he is taking Coumadin in the past without difficulty.  The patient has had 2 AICDs and most currently has an AICD-pacemaker-St. Jude The AICD is never discharged according to the patient  The patient appears to be an adequate candidate for destination therapy LVAD implantation. He will need to have a Plavix washout of at least one week and be admitted for a repeat right heart cath  48 hours before date of surgery. He will also need a CT scan of the abdomen and pelvis with oral contrast as he has had no endoscopy studies. Past Medical History:  Diagnosis Date  . AICD (automatic cardioverter/defibrillator) present   . Arthritis   . Barretts syndrome   . Carotid artery disease  (HCC)    a. Dopp 09/2011: 60-79% RICA, 40-59% LICA.;  b.  Carotid US (7/14):  Bilateral 60-79% => f/u 6 mos  . Cerebrovascular disease   . Chronic systolic heart failure (HCC)   . Coronary artery disease    a. AWMI requiring IABP 2005 s/p Horizon study stent-LAD, staged BMS-Cx, DESx2-RCA. b. Last Last LHC (6/06):  EF 25%, pLAD stent 40-50, after stent 40, dLAD 20, pCFX 20, pOM1 70, pRCA 20, mRCA stents ok.=> med Rx;  c.  Myoview (7/14):  EF 26%, large ant, septal, inf and apical infarct, no ischemia.  Med Rx continued  . Diabetes mellitus   . Dyspnea    with exertion  . Headache   . Heart murmur   . History of kidney stones   . HTN (hypertension)   . Hyperlipidemia    mixed  . ICD (implantable cardiac defibrillator), dual, in situ    St. Jude for severe LVD EF 25% 2/07 explanted 2010. Medtronic Virtuoso II DR Dual-chamber cardiverter-defibrillator  with pocket revision, Dr. Graciela HusbandsKlein  . Ischemic cardiomyopathy    a. Echo (09/27/12): EF 20%, diffuse HK with periapical AK, no LV thrombus noted, restrictive physiology, trivial MR, mild LAE, RVSF mildly reduced, PASP 39  . Pleural effusion 01/05/2016  . Presence of permanent cardiac pacemaker   . PVD (peripheral vascular disease) (HCC)    a. ABI 09/2011 - R normal, L moderate - saw Dr. Kirke CorinArida - med rx.   Marland Kitchen. RIATA ICD Lead--on advisory recall    . Tobacco abuse     Past Surgical History:  Procedure Laterality Date  . ABDOMINAL AORTAGRAM N/A 11/06/2013   Procedure: ABDOMINAL Ronny FlurryAORTAGRAM;  Surgeon: Chuck Hinthristopher S Dickson, MD;  [facility];  Service: Cardiovascular;  Laterality: N/A;  . APPENDECTOMY  2000   ruptured  . CARDIAC CATHETERIZATION N/A 01/07/2016   Procedure: Right/Left Heart Cath and Coronary Angiography;  Surgeon: Laurey Moralealton S McLean, MD;  [facility];  Service: Cardiovascular;  Laterality: N/A;  . CARDIAC CATHETERIZATION N/A 01/28/2016   Procedure: Coronary Stent Intervention;  Surgeon: Tonny BollmanMichael Cooper, MD;  [facility];  Service: Cardiovascular;  Laterality: N/A;  . CARDIAC CATHETERIZATION N/A 01/28/2016   Procedure: Right Heart Cath;  Surgeon: Tonny BollmanMichael Cooper, MD;  [facility];  Service: Cardiovascular;  Laterality: N/A;  . CARDIAC CATHETERIZATION N/A 01/28/2016   Procedure: Intravascular Ultrasound/IVUS;  Surgeon: Tonny BollmanMichael Cooper, MD;  [facility];  Service: Cardiovascular;  Laterality: N/A;  . CHEST TUBE INSERTION Right 04/07/2016   Procedure: INSERTION PLEURAL DRAINAGE CATHETER;  Surgeon: Kerin PernaPeter Van Trigt, MD;  [facility];  Service: Thoracic;  Laterality: Right;  . FEMORAL-POPLITEAL BYPASS GRAFT Right 11/07/2013   Procedure: Right FEMORAL-POPLITEAL ARTERY Bypass ;  Surgeon: Chuck Hinthristopher S Dickson, MD;  [facility];  Service: Vascular;  Laterality: Right;  . FEMORAL-POPLITEAL BYPASS GRAFT Left 11/17/2014   Procedure: BYPASS GRAFT FEMORAL-POPLITEAL ARTERY USING GORE PROPATEN 6MM X 80CM VASCULAR GRAFT;  Surgeon: Chuck Hinthristopher S Dickson, MD;  [facility];  Service: Vascular;  Laterality: Left;  . IMPLANTABLE CARDIOVERTER DEFIBRILLATOR (ICD) GENERATOR CHANGE N/A 12/16/2012   Procedure: ICD  GENERATOR CHANGE;  Surgeon: [name], MD;  Location: [facility] CATH LAB;  Service: Cardiovascular;  Laterality: N/A;  . INTRAOPERATIVE ARTERIOGRAM Right 11/07/2013   Procedure: INTRA OPERATIVE ARTERIOGRAM;  Surgeon: Chuck Hint, MD;  Location: [facility] OR;  Service: Vascular;  Laterality: Right;  . LOWER EXTREMITY ANGIOGRAM Bilateral 11/06/2013   Procedure: LOWER EXTREMITY ANGIOGRAM;  Surgeon: Chuck Hint, MD;  Location: [facility] CATH LAB;  Service: Cardiovascular;  Laterality: Bilateral;  . Medtronic Virtuoso II DR dual-chamber cardioverter-defibrillation with pocket revison     Dr. Sherryl Manges  . MULTIPLE EXTRACTIONS WITH ALVEOLOPLASTY N/A 04/26/2016   Procedure: Extraction of tooth #'s 5-14 and 20-30 with alveoloplasty;  Surgeon: Charlynne Pander, DDS;  [facility];  Service: Oral Surgery;  Laterality: N/A;  . PERIPHERAL VASCULAR CATHETERIZATION N/A 09/25/2014   Procedure: Abdominal Aortogram;  Surgeon: Sherren Kerns, MD;  Location: [facility] INVASIVE CV LAB;  Service: Cardiovascular;  Laterality: N/A;  . RIGHT HEART CATH N/A 05/01/2016   Procedure: Right Heart Cath;  Surgeon: Laurey Morale, MD;  Location: [facility] INVAS[facility] CV LAB;  Service: Cardiovascular;  Laterality: N/A;  . US GUIDED THORACENTESIS RIGHT ([facility] HX) Right 01/05/2016    Family History  Problem Relation Age of Onset  . Diabetes Mother   . Coronary artery disease Father   . Heart disease Father     before age 76    Social History Social History  Substance Use Topics  . Smoking status: Current Every Day Smoker    Packs/day: 0.50    Years: 45.00    Types: Cigarettes  . Smokeless tobacco: Never Used     Comment: pt reports he is stress smoker  . Alcohol use No    Current Outpatient Prescriptions  Medication Sig Dispense Refill  . aspirin 81 MG tablet Take 1 tablet (81 mg total) by mouth daily. 30 tablet 3  . atorvastatin (LIPITOR) 80 MG tablet Take 1 tablet (80 mg total) by mouth every other day. 30 tablet 11  . clopidogrel (PLAVIX) 75 MG tablet Take 1 tablet (75 mg total) by mouth daily. 30 tablet 6  . digoxin (LANOXIN) 0.125 MG tablet Take 1 tablet (0.125 mg total) by mouth daily. 30 tablet 11  . ezetimibe (ZETIA) 10 MG tablet Take 1 tablet (10 mg total) by mouth daily. 30 tablet 11  . insulin NPH-regular Human (NOVOLIN 70/30) (70-30) 100 UNIT/ML injection Inject 10-30 Units into the skin 3 (three) times daily. 30 units in the morning, 20 units in the afternoon, 10 units in the evening    . milrinone (PRIMACOR) 20 MG/100 ML SOLN infusion Inject 24.5 mcg/min into the vein continuous. 100 mL 0  . oxyCODONE-acetaminophen (PERCOCET) 5-325 MG tablet Take one or two tablets by mouth every 6 hours as needed for pain. 40 tablet 0  . potassium chloride SA (K-DUR,KLOR-CON) 20 MEQ tablet  Take 2 tablets (40 mEq total) by mouth daily. Take extra 2 meq (1 tablet) on Saturdays with metolazone. 90 tablet 6  . sildenafil (REVATIO) 20 MG tablet Take 1 tablet (20 mg total) by mouth 3 (three) times daily. 90 tablet 11  . spironolactone (ALDACTONE) 25 MG tablet Take 0.5 tablets (12.5 mg total) by mouth daily. 15 tablet 6  . torsemide (DEMADEX) 20 MG tablet Take 4 tablets (80 mg total) by mouth 2 (two) times daily. 240 tablet 6   No current facility-administered medications for this visit.     Allergies  Allergen Reactions  . Metformin And Related  Nausea And Vomiting    *Only the extended release* (does this)    Review of Systems     Works part-time as a Museum/gallery exhibitions officer at [employer]     No history of thoracic trauma or rib fracture\,      Right-hand dominant  Review of Systems :  [ y ] = yes, [  ] = no        General :  Weight gain [   ]    Weight loss  [   ]  Fatigue Mahler.Beck  ]  Fever [  ]  Chills  [  ]                                Weakness  [  ]           HEENT    Headache [  ]  Dizziness [  ]  Blurred vision [  ] Glaucoma  [  ]                          Nosebleeds [  ] Painful or loose teeth [recent dental extractions  ]        Cardiac :  Chest pain/ pressure [  ]  Resting SOB [  ] exertional SOB [yes]                        Orthopnea [  ]  Pedal edema  [  mild]  Palpitations [occasional  ] Syncope/presyncope [ ]                         Paroxysmal nocturnal dyspnea [  ]         Pulmonary : cough Mahler.Beck  ]  wheezing [  ]  Hemoptysis [  ] Sputum [  ] Snoring [  ]                              Pneumothorax [  ]  Sleep apnea [  ]        GI : Vomiting [  ]  Dysphagia [  ]  Melena  [  ]  Abdominal pain [  ] BRBPR [  ]              Heart burn [  ]  Constipation [  ] Diarrhea  [  ] Colonoscopy [   ]        GU : Hematuria [  ]  Dysuria [  ]  Nocturia [  ] UTI's [  ]        Vascular : Claudication [ yes bilateral femoropopliteal bypass grafts-grafts are patent by ultrasound but ABIs  are not detectable because of noncompressible vessels ]  Rest pain [  ]  DVT [  ] Vein stripping [  ] leg ulcers [healed  ]                          TIA [  ] Stroke [  ]  Varicose veins [ yes ]        NEURO :  Headaches  Mahler.Beck  ] Seizures [  ] Vision changes [  ] Paresthesias [  ]  Seizures [  ]        Musculoskeletal :  Arthritis [  ] Gout  [  ]  Back pain [mild  ]  Joint pain [  ]        Skin :  Rash [  ]  Melanoma [  ] Sores [  ]        Heme : Bleeding problems [  ]Clotting Disorders [  ] Anemia [  ]Blood Transfusion [ ]         Endocrine : Diabetes [yes on insulin  ] Heat or Cold intolerance [  ] Polyuria [  ]excessive thirst [ ]         Psych : Depression [  ]  Anxiety [  ]  Psych hospitalizations [  ] Memory change [  ]         History of heavy alcohol intake but no alcohol for 17 years Currently smokes probably 5 cigarettes a day                                     BP 117/71 (BP Location: Left Arm, Patient Position: Sitting, Cuff Size: Large)   Pulse 89   Resp 16   Ht 6' (1.829 m)   Wt 200 lb (90.7 kg)   SpO2 (!) 7% Comment: ON RA  BMI 27.12 kg/m  Physical Exam      Physical Exam  General: Well-nourished a chronically ill appearing Caucasian male conversant alert and oriented accompanied by father HEENT: Normocephalic pupils equal , dentition for mouth extractions Neck: Supple without JVD, adenopathy, or bruit Chest: Clear to auscultation, symmetrical breath sounds, no rhonchi, no tenderness             or deformity diminished breath sounds on the right side Cardiovascular: Regular rate and rhythm, no murmur, no gallop, peripheral pulses             palpable in all extremities Abdomen:  Soft, nontender, no palpable mass or organomegaly Extremities: Warm, well-perfused, no clubbing cyanosis edema or tenderness,             Significant bilateral venous stasis changes of the legs Rectal/GU: Deferred Neuro: Grossly non--focal and  symmetrical throughout Skin: Clean and dry without rash or ulceration    Diagnostic Tests:   Studies all reviewed as noted above  Impression: Advanced heart failure stage IIIB on home milrinone from ischemic cardiomyopathy and acute systolic heart failure with mild RV dysfunction no significant valvular dysfunction. He has moderate COPD. He appears to be a satisfactory and appropriate candidate for destination therapy indication left ventricular assist device implantation  Plan: Patient will continue his current medications He will be discussed at the [facility] conference by the multidisciplinary team to decide when we will go for with implantation and went to stop his Plavix. He'll need a CT scan of the abdomen and pelvis with contrast prior to surgery. We will remove the PICC line prior to surgery but leave the Pleurx catheter in place until the recurrent right effusion dries up. Mikey Bussing, MD [facility] [phone]

## 2016-05-05 ENCOUNTER — Encounter (HOSPITAL_COMMUNITY): Payer: BLUE CROSS/BLUE SHIELD

## 2016-05-05 NOTE — Addendum Note (Signed)
Encounter addended by: Marishka Rentfrow D Tzion Wedel on: 05/05/2016  8:37 AM<BR>    Actions taken: Visit Navigator Flowsheet section accepted, Flowsheet accepted

## 2016-05-08 ENCOUNTER — Other Ambulatory Visit (HOSPITAL_COMMUNITY): Payer: Self-pay | Admitting: *Deleted

## 2016-05-08 ENCOUNTER — Encounter (HOSPITAL_COMMUNITY): Payer: Self-pay | Admitting: Dentistry

## 2016-05-08 ENCOUNTER — Encounter (HOSPITAL_COMMUNITY): Payer: BLUE CROSS/BLUE SHIELD

## 2016-05-08 ENCOUNTER — Ambulatory Visit (HOSPITAL_COMMUNITY): Payer: Self-pay | Admitting: Dentistry

## 2016-05-08 VITALS — BP 115/60 | HR 79 | Temp 98.2°F

## 2016-05-08 DIAGNOSIS — K0820 Unspecified atrophy of edentulous alveolar ridge: Secondary | ICD-10-CM

## 2016-05-08 DIAGNOSIS — Z01818 Encounter for other preprocedural examination: Secondary | ICD-10-CM

## 2016-05-08 DIAGNOSIS — K08109 Complete loss of teeth, unspecified cause, unspecified class: Secondary | ICD-10-CM

## 2016-05-08 DIAGNOSIS — K08199 Complete loss of teeth due to other specified cause, unspecified class: Secondary | ICD-10-CM

## 2016-05-08 DIAGNOSIS — I5022 Chronic systolic (congestive) heart failure: Secondary | ICD-10-CM

## 2016-05-08 DIAGNOSIS — I255 Ischemic cardiomyopathy: Secondary | ICD-10-CM

## 2016-05-08 MED ORDER — CHLORHEXIDINE GLUCONATE 0.12 % MT SOLN: OROMUCOSAL | 99 refills | Status: DC

## 2016-05-08 NOTE — Patient Instructions (Addendum)
PLAN: 1. Start chlorhexidine rinses using 15 ML's twice a day in a swish and spit manner. [facility]  2. Continue salt water rinses as needed to aid healing. Brush tongue daily 3. Advance diet as tolerated to a soft mechanical diet. Use nutritional supplementation as instructed. 4. Proceed with the LVAD placement at the discretion of the cardiology and thoracic surgery team members. 5. Follow-up with a dentist of his choice for fabrication of upper lower complete dentures once medically stable from the anticipated LVAD procedure.   Charlynne Panderonald F. Kulinski, DDS

## 2016-05-08 NOTE — Progress Notes (Signed)
POST OPERATIVE NOTE:  05/08/2016 [name] [id number]  VITALS: BP 115/60 (BP Location: Left Arm)   Pulse 79   Temp 98.2 F (36.8 C) (Oral)   LABS:  Lab Results  Component Value Date   WBC 8.3 04/21/2016   HGB 13.3 04/21/2016   HCT 39.8 04/21/2016   MCV 86.3 04/21/2016   PLT 158 04/21/2016   BMET    Component Value Date/Time   NA 137 05/01/2016 0548   K 3.2 (L) 05/01/2016 0548   CL 95 (L) 05/01/2016 0548   CO2 30 05/01/2016 0548   GLUCOSE 194 (H) 05/01/2016 0548   BUN 11 05/01/2016 0548   CREATININE 0.84 05/01/2016 0548   CREATININE 0.77 04/05/2015 0950   CALCIUM 9.3 05/01/2016 0548   GFRNONAA >60 05/01/2016 0548   GFRNONAA >89 01/12/2014 0848   GFRAA >60 05/01/2016 0548   GFRAA >89 01/12/2014 0848    Lab Results  Component Value Date   INR 0.97 05/01/2016   INR 1.07 01/18/2016   INR 1.32 01/07/2016   No results found for: PTT   [name] is status post extraction of remaining teeth with alveoloplasty in the operating room on 04/26/2016. Patient now presents for evaluation of healing and suture removal as needed.  SUBJECTIVE: Patient with minimal complaints of discomfort. Patient indicates that a few stitches still remain.   EXAM: There is no sign of infection, heme, or ooze. Sutures are loosely intact. Patient is healing in by generalized primary closure.  PROCEDURE: The patient was given a chlorhexidine gluconate rinse for 30 seconds. Sutures were then removed without complication. Patient tolerated the procedure well.  ASSESSMENT: Post operative course is consistent with dental procedures performed in the operating room. Loss of teeth due to extractions Patient is completely edentulous There is atrophy of the edentulous alveolar ridges  PLAN: 1. Start chlorhexidine rinses using 15 ML's twice a day in a swish and spit manner. [facility].  2. Continue salt water rinses as needed to aid healing. Brush tongue daily 3.  Advance diet as tolerated to a soft mechanical diet. Use nutritional supplementation as instructed. 4. Proceed with the LVAD placement at the discretion of the cardiology and thoracic surgery team members. 5. Follow-up with a dentist of his choice for fabrication of upper lower complete dentures once medically stable from the anticipated LVAD procedure.   Charlynne Panderonald F. Aurther Harlin, DDS

## 2016-05-09 ENCOUNTER — Other Ambulatory Visit (HOSPITAL_COMMUNITY): Payer: Self-pay | Admitting: *Deleted

## 2016-05-09 ENCOUNTER — Telehealth (HOSPITAL_COMMUNITY): Payer: Self-pay | Admitting: *Deleted

## 2016-05-09 DIAGNOSIS — I5041 Acute combined systolic (congestive) and diastolic (congestive) heart failure: Secondary | ICD-10-CM

## 2016-05-09 NOTE — Telephone Encounter (Signed)
Informed patient of scheduled admission and right heart cath 5/21 in preparation for LVAD implant on 5/22. Instructed to stop Plavix may 14 th. Patient will arrive in admitting at 0530 on 5/21. Plans made to provide further LVAD education at follow up appointment this Wednesday 5/9 where we will also complete CT scans and labs. Verbalized understanding.   Marcellus ScottLesley Alazia Crocket RN, VAD Coordinator 24/7 pager [phone]

## 2016-05-09 NOTE — Telephone Encounter (Signed)
CT chest w/contrast CT abdomen/pelvis  auth [id number]   Valid Dates:  05/09/2016 - 07/07/2016

## 2016-05-10 ENCOUNTER — Encounter (HOSPITAL_COMMUNITY): Payer: Self-pay | Admitting: *Deleted

## 2016-05-10 ENCOUNTER — Encounter (HOSPITAL_COMMUNITY): Payer: BLUE CROSS/BLUE SHIELD

## 2016-05-10 ENCOUNTER — Encounter (HOSPITAL_COMMUNITY): Payer: Self-pay

## 2016-05-10 ENCOUNTER — Telehealth (HOSPITAL_COMMUNITY): Payer: Self-pay | Admitting: *Deleted

## 2016-05-10 ENCOUNTER — Ambulatory Visit (HOSPITAL_COMMUNITY)
Admission: RE | Admit: 2016-05-10 | Discharge: 2016-05-10 | Disposition: A | Discharge status: Home / Self Care | Payer: BLUE CROSS/BLUE SHIELD | Source: Ambulatory Visit | Attending: Cardiology | Admitting: Cardiology

## 2016-05-10 VITALS — BP 124/57 | HR 82 | Ht 72.0 in | Wt 206.0 lb

## 2016-05-10 DIAGNOSIS — I739 Peripheral vascular disease, unspecified: Secondary | ICD-10-CM | POA: Diagnosis not present

## 2016-05-10 DIAGNOSIS — Z794 Long term (current) use of insulin: Secondary | ICD-10-CM | POA: Diagnosis not present

## 2016-05-10 DIAGNOSIS — Z7982 Long term (current) use of aspirin: Secondary | ICD-10-CM | POA: Diagnosis not present

## 2016-05-10 DIAGNOSIS — E1151 Type 2 diabetes mellitus with diabetic peripheral angiopathy without gangrene: Secondary | ICD-10-CM | POA: Diagnosis not present

## 2016-05-10 DIAGNOSIS — Z79899 Other long term (current) drug therapy: Secondary | ICD-10-CM | POA: Diagnosis not present

## 2016-05-10 DIAGNOSIS — F17210 Nicotine dependence, cigarettes, uncomplicated: Secondary | ICD-10-CM | POA: Diagnosis not present

## 2016-05-10 DIAGNOSIS — I2510 Atherosclerotic heart disease of native coronary artery without angina pectoris: Secondary | ICD-10-CM | POA: Insufficient documentation

## 2016-05-10 DIAGNOSIS — I5022 Chronic systolic (congestive) heart failure: Secondary | ICD-10-CM | POA: Insufficient documentation

## 2016-05-10 DIAGNOSIS — E785 Hyperlipidemia, unspecified: Secondary | ICD-10-CM | POA: Insufficient documentation

## 2016-05-10 DIAGNOSIS — J90 Pleural effusion, not elsewhere classified: Secondary | ICD-10-CM

## 2016-05-10 DIAGNOSIS — I252 Old myocardial infarction: Secondary | ICD-10-CM | POA: Diagnosis not present

## 2016-05-10 DIAGNOSIS — J449 Chronic obstructive pulmonary disease, unspecified: Secondary | ICD-10-CM | POA: Insufficient documentation

## 2016-05-10 DIAGNOSIS — Z955 Presence of coronary angioplasty implant and graft: Secondary | ICD-10-CM | POA: Insufficient documentation

## 2016-05-10 DIAGNOSIS — I255 Ischemic cardiomyopathy: Secondary | ICD-10-CM | POA: Diagnosis not present

## 2016-05-10 DIAGNOSIS — Z7902 Long term (current) use of antithrombotics/antiplatelets: Secondary | ICD-10-CM | POA: Diagnosis not present

## 2016-05-10 DIAGNOSIS — I110 Hypertensive heart disease with heart failure: Secondary | ICD-10-CM | POA: Insufficient documentation

## 2016-05-10 LAB — BASIC METABOLIC PANEL
Anion gap: 9 (ref 5–15)
BUN: 10 mg/dL (ref 6–20)
CO2: 30 mmol/L (ref 22–32)
CREATININE: 0.9 mg/dL (ref 0.61–1.24)
Calcium: 8.5 mg/dL — ABNORMAL LOW (ref 8.9–10.3)
Chloride: 97 mmol/L — ABNORMAL LOW (ref 101–111)
GFR calc Af Amer: >60 mL/min (ref >60)
GFR calc non Af Amer: >60 mL/min (ref >60)
GLUCOSE: 192 mg/dL — AB (ref 65–99)
Potassium: 3.4 mmol/L — ABNORMAL LOW (ref 3.5–5.1)
Sodium: 136 mmol/L (ref 135–145)

## 2016-05-10 LAB — DIGOXIN LEVEL: Digoxin Level: 0.4 ng/mL — ABNORMAL LOW (ref 0.8–2.0)

## 2016-05-10 MED ORDER — TORSEMIDE 20 MG PO TABS: ORAL_TABLET | ORAL | 6 refills | Status: DC

## 2016-05-10 MED ORDER — POTASSIUM CHLORIDE CRYS ER 20 MEQ PO TBCR: EXTENDED_RELEASE_TABLET | ORAL | 6 refills | Status: DC

## 2016-05-10 NOTE — Telephone Encounter (Signed)
-----   Message from Laurey Moralealton S McLean, MD sent at 05/10/2016 12:43 PM EDT ----- Increase KCl to 40 qam/20 qpm.  BMET 1 week.

## 2016-05-10 NOTE — Telephone Encounter (Signed)
Notes recorded by Modesta MessingBrown, Kealey Kemmer Q, CMA on 05/10/2016 at 2:58 PM EDT Patient aware of results and medication change. Added to lab schedule 5/16 ------  Notes recorded by Laurey MoraleMcLean, Dalton S, MD on 05/10/2016 at 12:43 PM EDT Increase KCl to 40 qam/20 qpm. BMET 1 week.    Ref Range & Units 10:59 9d ago 2wk ago   Sodium 135 - 145 mmol/L 136  137  135    Potassium 3.5 - 5.1 mmol/L 3.4   3.2   3.2     Chloride 101 - 111 mmol/L 97   95   91     CO2 22 - 32 mmol/L 30  30  34     Glucose, Bld 65 - 99 mg/dL 147192   [id number]   [id number]     BUN 6 - 20 mg/dL 10  11  20     Creatinine, Ser 0.61 - 1.24 mg/dL 1.300.90  8.650.84  7.841.04    Calcium 8.9 - 10.3 mg/dL 8.5   9.3  9.1    GFR calc non Af Amer >60 mL/min >60  >60  >60    GFR calc Af Amer >60 mL/min >60  >60CM  >60CM

## 2016-05-10 NOTE — Patient Instructions (Signed)
Stop Plavix on Monday 05/15/16, 1 week prior to surgery  Quit smoking by Mon 05/15/16, 1 week prior to surgery  You will be admitted to the hospital on Monday 05/22/16  Labs done today  We will schedule you a follow up appointment when you are discharged from the hospital

## 2016-05-10 NOTE — Progress Notes (Addendum)
Advanced Heart Failure Clinic Note   PCP: Dr. Sigmund Hazel Cardiology: Dr. Jens Som HF Cardiology: Dr. Shirlee Latch  61 yo smoker with history of PAD, CAD, and ischemic cardiomyopathy presents for CHF clinic evaluation after recent admission.  Patient was admitted in 1/18 with PNA and acute on chronic systolic CHF.  He was markedly volume overloaded and also had low cardiac output requiring dobutamine use.  He had a right pleural effusion and underwent thoracentesis.  Fluid studies suggested a parapneumonic effusion.  He had cardiac cath, showing 3 vessel disease and markedly elevated filling pressures, right and left.  He was evaluated for CABG but turned down given comorbidities and lack of venous conduits.  We were able to wean him off dobutamine and discharged him with plan for outpatient PCI once he had recovered from the current hospitalization.   Admitted 01/28/16 - 02/03/16 after presenting for elective 2 vessel PCI of the LAD and OM of the circumflex. RHC at same time showed advanced HF with low cardiac index and severely elevated pressures. Diuresed with IV lasix, metolazone, and milrinone, but refused PICC line to follow coox/CVP. Underwent thoracentesis 02/01/16 with 1.8 L out. Overall diuresed 16 lbs. Discharge weight 214 lbs.  He was admitted in 4/18 with low output HF and severe NYHA class IV symptoms, started on milrinone.  Unable to titrate off milrinone and is now on at home.  He had a right chronic transudative effusion, PleurX catheter was placed.    He feels marginally better on milrinone but still very limited.  Fatigues very easily, very short of breath still.  I am concerned that he is at high risk of decompenstion. He is now only smoking a few cigarettes today.  He was presented at [facility], now with plan for urgent LVAD placement on 5/22.  PleurX catheter is still draining.  Weight is up about 3 lbs.   Corevue: Impedance trending down, suggesting fluid retention.   Labs (1/18): K 4.2,  creatinine 0.9, hgb 12.6 Labs (2/18): K 4.2, creatinine 1.23, hgb 12.4, BNP 321 Labs (4/18): hgbA1c 8.9, TSH elevated but free T4 normal.  K 3.2, creatinine 0.8  ECG: NSR, IVCD 142 msec  PMH: 1. PAD: Left fem-pop bypass with graft in 2017.  2. CAD: Anterior MI 2005 => PCI to LAD followed by staged PCI to LCx and RCA.   - LHC (1/18): Diffuse up to 80% calcified stenosis throughout the proximal LAD.  Diffuse disease in distal LAD up to 50%.  Large OM1 totally occluded at the ostium but reconstituted via collaterals.  80% distal RCA stenosis involving the ostia of the PLV and PDA with about 70% stenosis. Patient had PCI with DES to proximal LAD and PTCA OM1 (unable to place stent).  3. Chronic systolic CHF: Ischemic cardiomyopathy.  St Jude ICD.  - Echo (1/18) with EF 20%, mildly decreased RV systolic function.  - RHC (1/18) with mean RA 21, PA 69/31 mean 45, mean PCWP 27, CI 2.37, PVR 3.3 WU.  - Repeat RHC (1/18) with mean RA 16, mean PCWP 21, CI 1.92 - Echo (3/18) with EF 20-25%, mild MR, mild RV dilation with normal systolic function, PASP 47 mmHg.  - 4/18 admission with low output HF and milrinone gtt started.  - RHC on milrinone (4/18): mean RA 5, PA 61/24 mean 40, mean PCWP 17, CI 2.1 Fick with PVR 5.1 WU, CI 1.84 thermo with PVR 5.9 WU.  4. Carotid stenosis: Carotid dopplers (8/16) with 60-79% RICA, 40-59% LICA.   -  Carotid dopplers (2/18) with 40-59% RICA stenosis.  5. Type II diabetes. 6. Hyperlipidemia. 7. HTN 8. COPD:  PFTs (5/18) with moderate to severe obstructive disease.  Active smoker. 9. Chronic right pleural effusion (transudative) with PleurX catheter.   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  Smoking around 1/2 ppd (has cut back).  Occasional ETOH.   Family History  Problem Relation Age of Onset  . Diabetes Mother   . Coronary artery disease Father   . Heart disease Father     before age 62   ROS: All systems reviewed and negative except as per HPI.    Current Outpatient Prescriptions  Medication Sig Dispense Refill  . aspirin 81 MG tablet Take 1 tablet (81 mg total) by mouth daily. 30 tablet 3  . atorvastatin (LIPITOR) 80 MG tablet Take 1 tablet (80 mg total) by mouth every other day. 30 tablet 11  . chlorhexidine (PERIDEX) 0.12 % solution Rinse with 15 mls twice daily for 30 seconds. Use after breakfast and at bedtime. Spit out excess. Do not swallow. 960 mL prn  . clopidogrel (PLAVIX) 75 MG tablet Take 1 tablet (75 mg total) by mouth daily. 30 tablet 6  . digoxin (LANOXIN) 0.125 MG tablet Take 1 tablet (0.125 mg total) by mouth daily. 30 tablet 11  . ezetimibe (ZETIA) 10 MG tablet Take 1 tablet (10 mg total) by mouth daily. 30 tablet 11  . insulin NPH-regular Human (NOVOLIN 70/30) (70-30) 100 UNIT/ML injection Inject 10-30 Units into the skin 3 (three) times daily. 30 units in the morning, 20 units in the afternoon, 10 units in the evening    . milrinone (PRIMACOR) 20 MG/100 ML SOLN infusion Inject 24.5 mcg/min into the vein continuous. 100 mL 0  . sildenafil (REVATIO) 20 MG tablet Take 1 tablet (20 mg total) by mouth 3 (three) times daily. 90 tablet 11  . spironolactone (ALDACTONE) 25 MG tablet Take 0.5 tablets (12.5 mg total) by mouth daily. 15 tablet 6  . potassium chloride SA (K-DUR,KLOR-CON) 20 MEQ tablet Take 40meq in the AM and 20meq in the PM by mouth daily. 90 tablet 6  . torsemide (DEMADEX) 20 MG tablet Take 5 tabs in AM and 4 tabs in PM 240 tablet 6   No current facility-administered medications for this encounter.    BP (!) 124/57 (BP Location: Left Arm, Patient Position: Sitting, Cuff Size: Normal)   Pulse 82   Ht 6' (1.829 m)   Wt 206 lb (93.4 kg)   SpO2 100%   BMI 27.94 kg/m    Wt Readings from Last 3 Encounters:  05/10/16 206 lb (93.4 kg)  05/04/16 200 lb (90.7 kg)  05/01/16 201 lb (91.2 kg)    General: NAD Neck: JVP 8 cm, no thyromegaly or thyroid nodule.  Lungs: Decreased breath sounds right base.   CV:  Nondisplaced PMI.  Heart regular S1/S2, no S3/S4, 1/6 early SEM RUSB.  No carotid bruit.  Unable to palpate pedal pulses. Abdomen: Soft, NT, ND, no HSM. No bruits or masses. +BS  Skin:  Changes of venous stasis dermatitis noted. 1+ ankle edema. Neurologic: Alert and oriented x 3.  Psych: Normal affect. Extremities: No clubbing or cyanosis.  HEENT: Normal.   Assessment/Plan: 1. Chronic systolic CHF: Ischemic cardiomyopathy.  Echo (1/18) with EF 20%, mildly decreased RV systolic function, Echo in 3/18 with EF 20-25%.  He did not have much effect from 12/17 PCI in terms of symptoms or EF.  St  Jude ICD.  On exam today and by Corevue, he is volume overloaded.  He is now dependent on home milrinone with some stabilization of symptoms but is still very tenuous and at high risk for decompensation.  NYHA class IV.  He is very limited.  - Continue milrinone 0.25 mcg/kg/min - Increase torsemide to 100 mg bid x 3 days, then 100 qam/80 qpm after that.  BMET today and again in 2 wks.  - Continue digoxin 0.125 mg daily, check level today.  - He is now off losartan and Coreg with hypotension and low output.   - Continue spironolactone 12.5 mg daily. - He is now dependent on home milrinone.  He feels better on milrinone compared to off.  We have discussed LVAD with Mr Kolenda and think he will be a reasonable candidate.  Plan will be for him to stop Plavix on 5/14 (>3 months after PCI), have RHC on 5/21, then have HVAD placement on 5/22.  His father will be his caregiver.   2. CAD: 3 vessel CAD, s/p stent to proximal LAD and PTCA to subtotally occluded large OM1 in 1/18. No change in symptoms or EF with intervention.  - Continue plavix 75 mg daily.  As above, will stop on 5/14 prior to LVAD placement. This will be > 3 months post PCI with a new generation DES.  - Continue ASA 81 and atorvastatin 80 mg daily.   3. PAD: Stable claudication, notes walking up a hill.  - He follows with [facility]. No change to current plans.    4. Carotid stenosis:  Repeat carotid dopplers in 2/19.  5. R Pleural effusion: Chronic, transudate, has PleurX catheter in place. He will keep this in place through surgery.  6. Pulmonary arterial HTN: Noted on RHC 05/01/16.  Revatio 20 mg tid started.  Continue through LVAD surgery.  7. Smoking: I strongly encouraged him to stop smoking prior to surgery.  He plans to stop completely a week prior. He has cut down to just a few cigarettes a day.   We had a long discussion about LVAD care and about care coordination leading up to surgery.   Marca Ancona 05/10/2016

## 2016-05-10 NOTE — Telephone Encounter (Signed)
Pt's Corvue reading came through after pt had left office today and is elevated.  Per Dr Shirlee LatchMcLean pt needs to increase Torsemide to 100 mg BID for 3 days then decrease to 100 mg in AM and 80 mg in PM.  Called and spoke w/pt he is aware, agreeable and verbalizes understanding

## 2016-05-11 ENCOUNTER — Ambulatory Visit (HOSPITAL_COMMUNITY)
Admission: RE | Admit: 2016-05-11 | Discharge: 2016-05-11 | Disposition: A | Discharge status: Home / Self Care | Payer: BLUE CROSS/BLUE SHIELD | Source: Ambulatory Visit | Attending: Cardiology | Admitting: Cardiology

## 2016-05-11 DIAGNOSIS — I2510 Atherosclerotic heart disease of native coronary artery without angina pectoris: Secondary | ICD-10-CM | POA: Diagnosis not present

## 2016-05-11 DIAGNOSIS — J90 Pleural effusion, not elsewhere classified: Secondary | ICD-10-CM | POA: Insufficient documentation

## 2016-05-11 DIAGNOSIS — N200 Calculus of kidney: Secondary | ICD-10-CM | POA: Diagnosis not present

## 2016-05-11 DIAGNOSIS — I252 Old myocardial infarction: Secondary | ICD-10-CM | POA: Insufficient documentation

## 2016-05-11 DIAGNOSIS — Z01818 Encounter for other preprocedural examination: Secondary | ICD-10-CM | POA: Insufficient documentation

## 2016-05-11 MED ORDER — IOPAMIDOL (ISOVUE-300) INJECTION 61%
INTRAVENOUS | Status: AC
  Administered 2016-05-11: 100 mL
  Filled 2016-05-11: qty 100

## 2016-05-11 MED ORDER — BARIUM SULFATE 2.1 % PO SUSP
ORAL | Status: AC
  Filled 2016-05-11: qty 2

## 2016-05-12 ENCOUNTER — Encounter (HOSPITAL_COMMUNITY): Payer: BLUE CROSS/BLUE SHIELD

## 2016-05-12 ENCOUNTER — Other Ambulatory Visit (HOSPITAL_COMMUNITY): Payer: Self-pay | Admitting: Cardiology

## 2016-05-12 MED ORDER — POTASSIUM CHLORIDE CRYS ER 20 MEQ PO TBCR: EXTENDED_RELEASE_TABLET | ORAL | 6 refills | Status: DC

## 2016-05-15 ENCOUNTER — Telehealth (HOSPITAL_COMMUNITY): Payer: Self-pay | Admitting: *Deleted

## 2016-05-15 ENCOUNTER — Encounter (HOSPITAL_COMMUNITY): Payer: BLUE CROSS/BLUE SHIELD

## 2016-05-15 NOTE — Telephone Encounter (Signed)
Spoke with patient concerning insurance requirement for him to have [facility]. Informed him that he needs to resume Plavix until further notice. Will update as possible.  Marcellus ScottLesley Beda Dula RN, VAD Coordinator 24/7 pager [phone]

## 2016-05-16 ENCOUNTER — Telehealth: Payer: Self-pay | Admitting: Licensed Clinical Social Worker

## 2016-05-16 ENCOUNTER — Encounter (HOSPITAL_COMMUNITY): Payer: Self-pay | Admitting: Cardiology

## 2016-05-16 NOTE — Addendum Note (Signed)
Encounter addended by: Laurey MoraleMcLean, Sarea Fyfe S, MD on: 05/16/2016  3:45 PM<BR>    Actions taken: Sign clinical note

## 2016-05-17 ENCOUNTER — Encounter: Payer: Self-pay | Admitting: Internal Medicine

## 2016-05-17 ENCOUNTER — Encounter (HOSPITAL_COMMUNITY): Payer: BLUE CROSS/BLUE SHIELD

## 2016-05-17 ENCOUNTER — Encounter: Payer: Self-pay | Admitting: Licensed Clinical Social Worker

## 2016-05-17 ENCOUNTER — Telehealth: Payer: Self-pay | Admitting: Licensed Clinical Social Worker

## 2016-05-17 ENCOUNTER — Ambulatory Visit (INDEPENDENT_AMBULATORY_CARE_PROVIDER_SITE_OTHER): Payer: BLUE CROSS/BLUE SHIELD | Admitting: Internal Medicine

## 2016-05-17 ENCOUNTER — Other Ambulatory Visit (HOSPITAL_COMMUNITY): Payer: Self-pay

## 2016-05-17 VITALS — BP 106/56 | HR 90 | Ht 72.0 in | Wt 217.0 lb

## 2016-05-17 DIAGNOSIS — I5022 Chronic systolic (congestive) heart failure: Secondary | ICD-10-CM

## 2016-05-17 DIAGNOSIS — I255 Ischemic cardiomyopathy: Secondary | ICD-10-CM | POA: Diagnosis not present

## 2016-05-17 DIAGNOSIS — Z95810 Presence of automatic (implantable) cardiac defibrillator: Secondary | ICD-10-CM | POA: Diagnosis not present

## 2016-05-17 NOTE — Patient Instructions (Addendum)
Medication Instructions: - Your physician recommends that you continue on your current medications as directed. Please refer to the Current Medication list given to you today.  Labwork: - none ordered  Procedures/Testing: - none ordered  Follow-Up: - Your physician recommends that you schedule a follow-up appointment in: 3 months with Device Clinic  - Your physician wants you to follow-up in: 1 year with Dr. Klein. You will receive a reminder letter in the mail two months in advance. If you don't receive a letter, please call our office to schedule the follow-up appointment.   Any Additional Special Instructions Will Be Listed Below (If Applicable).     If you need a refill on your cardiac medications before your next appointment, please call your pharmacy.   
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
## 2016-05-17 NOTE — Progress Notes (Signed)
Patient Care Team: Sigmund Hazel, MD as PCP - General (Family Medicine)   HPI  [name] is a 61 y.o. male Seen in followup for ischemic heart disease with prior stenting of LAD/RCA and LCx with last Cath 2006. He has a previously implanted ICD for which  he underwent generator replacement 12/14 .  He has known peripheral vascular disease with ABIs in remote past and carotid Dopplers in our office a year ago with 60-79% bilaterally.   He was 11/15  hosp for chf  EF 25 %;Last myoview was in 07/2012 with large, severe, fixed anterior, septal, inferior and apical infarct; no ischemia, EF 26%     His had recurrent hospitalizations for congestive heart failure and 4/18 he was put on milrinone. He also had a Pleurx catheter placed for chronic  effusions. He is feeling better than he has in some times. The plan has been to proceed with LVAD; unfortunately, his surgical date for next week was turned down by insurance coverage who wants him to go to [facility]   Past Medical History:  Diagnosis Date  . AICD (automatic cardioverter/defibrillator) present   . Arthritis   . Barretts syndrome   . Carotid artery disease (HCC)    a. Dopp 09/2011: 60-79% RICA, 40-59% LICA.;  b.  Carotid US (7/14):  Bilateral 60-79% => f/u 6 mos  . Cerebrovascular disease   . Chronic systolic heart failure (HCC)   . Coronary artery disease    a. AWMI requiring IABP 2005 s/p Horizon study stent-LAD, staged BMS-Cx, DESx2-RCA. b. Last Last LHC (6/06):  EF 25%, pLAD stent 40-50, after stent 40, dLAD 20, pCFX 20, pOM1 70, pRCA 20, mRCA stents ok.=> med Rx;  c.  Myoview (7/14):  EF 26%, large ant, septal, inf and apical infarct, no ischemia.  Med Rx continued  . Diabetes mellitus   . Dyspnea    with exertion  . Headache   . Heart murmur   . History of kidney stones   . HTN (hypertension)   . Hyperlipidemia    mixed  . ICD (implantable cardiac defibrillator), dual, in situ    St. Jude for severe LVD EF 25% 2/07  explanted 2010. Medtronic Virtuoso II DR Dual-chamber cardiverter-defibrillator  with pocket revision, Dr. Graciela Husbands  . Ischemic cardiomyopathy    a. Echo (09/27/12): EF 20%, diffuse HK with periapical AK, no LV thrombus noted, restrictive physiology, trivial MR, mild LAE, RVSF mildly reduced, PASP 39  . Pleural effusion 01/05/2016  . Presence of permanent cardiac pacemaker   . PVD (peripheral vascular disease) (HCC)    a. ABI 09/2011 - R normal, L moderate - saw Dr. Kirke Corin - med rx.   Marland Kitchen RIATA ICD Lead--on advisory recall    . Tobacco abuse     Past Surgical History:  Procedure Laterality Date  . ABDOMINAL AORTAGRAM N/A 11/06/2013   Procedure: ABDOMINAL Ronny Flurry;  Surgeon: Chuck Hint, MD;  Location: [facility] CATH LAB;  Service: Cardiovascular;  Laterality: N/A;  . APPENDECTOMY  2000   ruptured  . CARDIAC CATHETERIZATION N/A 01/07/2016   Procedure: Right/Left Heart Cath and Coronary Angiography;  Surgeon: Laurey Morale, MD;  Location: [facility] INVASIVE CV LAB;  Service: Cardiovascular;  Laterality: N/A;  . CARDIAC CATHETERIZATION N/A 01/28/2016   Procedure: Coronary Stent Intervention;  Surgeon: Tonny Bollman, MD;  Location: [facility] INVASIVE CV LAB;  Service: Cardiovascular;  Laterality: N/A;  . CARDIAC CATHETERIZATION N/A 01/28/2016   Procedure: Right Heart Cath;  Surgeon: Tonny Bollman,  MD;  Location: [facility] INVASIVE CV LAB;  Service: Cardiovascular;  Laterality: N/A;  . CARDIAC CATHETERIZATION N/A 01/28/2016   Procedure: Intravascular Ultrasound/IVUS;  Surgeon: Tonny Bollman, MD;  Location: [facility] INVASIVE CV LAB;  Service: Cardiovascular;  Laterality: N/A;  . CHEST TUBE INSERTION Right 04/07/2016   Procedure: INSERTION PLEURAL DRAINAGE CATHETER;  Surgeon: Kerin Perna, MD;  Location: [facility] OR;  Service: Thoracic;  Laterality: Right;  . FEMORAL-POPLITEAL BYPASS GRAFT Right 11/07/2013   Procedure: Right FEMORAL-POPLITEAL ARTERY Bypass ;  Surgeon: Chuck Hint, MD;  Location[facility] OR;  Service: Vascular;   Laterality: Right;  . FEMORAL-POPLITEAL BYPASS GRAFT Left 11/17/2014   Procedure: BYPASS GRAFT FEMORAL-POPLITEAL ARTERY USING GORE PROPATEN X 80CM VASCULAR GRAFT;  Surgeon: Chuck Hint, MD;  Location: [facility] OR;  Service: Vascular;  Laterality: Left;  . IMPLANTABLE CARDIOVERTER DEFIBRILLATOR (ICD) GENERATOR CHANGE N/A 12/16/2012   Procedure: ICD GENERATOR CHANGE;  Surgeon: [name], MD;  Location: [facility] CATH LAB;  Service: Cardiovascular;  Laterality: N/A;  . INTRAOPERATIVE ARTERIOGRAM Right 11/07/2013   Procedure: INTRA OPERATIVE ARTERIOGRAM;  Surgeon: Chuck Hint, MD;  Location: [facility] OR;  Service: Vascular;  Laterality: Right;  . LOWER EXTREMITY ANGIOGRAM Bilateral 11/06/2013   Procedure: LOWER EXTREMITY ANGIOGRAM;  Surgeon: Chuck Hint, MD;  Location: [facility] CATH LAB;  Service: Cardiovascular;  Laterality: Bilateral;  . Medtronic Virtuoso II DR dual-chamber cardioverter-defibrillation with pocket revison     Dr. Sherryl Manges  . MULTIPLE EXTRACTIONS WITH ALVEOLOPLASTY N/A 04/26/2016   Procedure: Extraction of tooth #'s 5-14 and 20-30 with alveoloplasty;  Surgeon: Charlynne Pander, DDS;  Location: [facility] OR;  Service: Oral Surgery;  Laterality: N/A;  . PERIPHERAL VASCULAR CATHETERIZATION N/A 09/25/2014   Procedure: Abdominal Aortogram;  Surgeon: Sherren Kerns, MD;  Location: [facility] INVASIVE CV LAB;  Service: Cardiovascular;  Laterality: N/A;  . RIGHT HEART CATH N/A 05/01/2016   Procedure: Right Heart Cath;  Surgeon: Laurey Morale, MD;  Location: [facility] INVASIVE CV LAB;  Service: Cardiovascular;  Laterality: N/A;  . US GUIDED THORACENTESIS RIGHT ([facility] HX) Right 01/05/2016    Current Outpatient Prescriptions  Medication Sig Dispense Refill  . aspirin 81 MG tablet Take 1 tablet (81 mg total) by mouth daily. 30 tablet 3  . atorvastatin (LIPITOR) 80 MG tablet Take 1 tablet (80 mg total) by mouth every other day. 30 tablet 11  . chlorhexidine (PERIDEX) 0.12 % solution Rinse with 15  mls twice daily for 30 seconds. Use after breakfast and at bedtime. Spit out excess. Do not swallow. 960 mL prn  . clopidogrel (PLAVIX) 75 MG tablet Take 1 tablet (75 mg total) by mouth daily. 30 tablet 6  . digoxin (LANOXIN) 0.125 MG tablet Take 1 tablet (0.125 mg total) by mouth daily. 30 tablet 11  . ezetimibe (ZETIA) 10 MG tablet Take 1 tablet (10 mg total) by mouth daily. 30 tablet 11  . insulin NPH-regular Human (NOVOLIN 70/30) (70-30) 100 UNIT/ML injection Inject 10-30 Units into the skin 3 (three) times daily. 30 units in the morning, 20 units in the afternoon, 10 units in the evening    . milrinone (PRIMACOR) 20 MG/100 ML SOLN infusion Inject 24.5 mcg/min into the vein continuous. 100 mL 0  . potassium chloride SA (K-DUR,KLOR-CON) 20 MEQ tablet Take in the AM and in the PM by mouth daily. 90 tablet 6  . sildenafil (REVATIO) 20 MG tablet Take 1 tablet (20 mg total) by mouth 3 (three) times daily. 90 tablet  11  . spironolactone (ALDACTONE) 25 MG tablet Take 0.5 tablets (12.5 mg total) by mouth daily. 15 tablet 6  . torsemide (DEMADEX) 20 MG tablet Take 5 tabs in AM and 4 tabs in PM 240 tablet 6   No current facility-administered medications for this visit.     Allergies  Allergen Reactions  . Metformin And Related Nausea And Vomiting    *Only the extended release* (does this)    Review of Systems negative except from HPI and PMH  Physical Exam BP (!) 106/56   Pulse 90   Ht 6' (1.829 m)   Wt 217 lb (98.4 kg)   SpO2 97%   BMI 29.43 kg/m  Well developed and nourished in no acute distress HENT normal Neck supple with JVP-8 Carotids brisk and full without bruits Clear Pump in place  Regular rate and rhythm, no murmurs or gallops Abd-soft with active BS without hepatomegaly No Clubbing cyanosis 2-3 edema Skin-warm and dry SOME erythema A & Oriented  Grossly 2normal sensory and motor function    ECG 5 January sinus rhythm at 86 16/16/41 IVCD with   notching  Assessment and  Plan   C CHF SYSTOLIC CLASS 2  ischemic cardiomyopathy   implantable defibrillator St. Jude The patient's device was interrogated.  The information was reviewed. No changes were made in the programming.     IVCD But there is a morphology change in his IVCD which raises the question as to whether CRT is something worth pursuing.     Euvolemic continue current meds   I will talk with Dr. Shirlee LatchMcLean about his IVCD   ECG 4/18 QRS duration was only 140 ms      Social history her last dose probably Wednesday night and oriented and right right year yellow father felt he recalls over there all

## 2016-05-18 ENCOUNTER — Telehealth: Payer: Self-pay | Admitting: Licensed Clinical Social Worker

## 2016-05-19 ENCOUNTER — Encounter (HOSPITAL_COMMUNITY): Payer: BLUE CROSS/BLUE SHIELD

## 2016-05-19 LAB — CUP PACEART INCLINIC DEVICE CHECK
Brady Statistic RV Percent Paced: 0 %
Date Time Interrogation Session: 20180516155028
HIGH POWER IMPEDANCE MEASURED VALUE: 50.0625
Implantable Pulse Generator Implant Date: 20141215
Lead Channel Impedance Value: 450 Ohm
Lead Channel Pacing Threshold Amplitude: 0.75 V
Lead Channel Pacing Threshold Pulse Width: 0.5 ms
Lead Channel Sensing Intrinsic Amplitude: 11.7 mV
Lead Channel Setting Pacing Amplitude: 2.5 V
MDC IDC LEAD IMPLANT DT: 20070207
MDC IDC LEAD LOCATION: 753860
MDC IDC MSMT LEADCHNL RV PACING THRESHOLD AMPLITUDE: 0.75 V
MDC IDC MSMT LEADCHNL RV PACING THRESHOLD PULSEWIDTH: 0.5 ms
MDC IDC SET LEADCHNL RV PACING PULSEWIDTH: 0.5 ms
MDC IDC SET LEADCHNL RV SENSING SENSITIVITY: 0.5 mV
Pulse Gen Serial Number: 7155171

## 2016-05-22 ENCOUNTER — Encounter (HOSPITAL_COMMUNITY): Admission: RE | Payer: Self-pay | Source: Ambulatory Visit

## 2016-05-22 ENCOUNTER — Encounter (HOSPITAL_COMMUNITY): Payer: BLUE CROSS/BLUE SHIELD

## 2016-05-22 ENCOUNTER — Ambulatory Visit (HOSPITAL_COMMUNITY): Admission: RE | Admit: 2016-05-22 | Payer: BLUE CROSS/BLUE SHIELD | Source: Ambulatory Visit | Admitting: Cardiology

## 2016-05-22 ENCOUNTER — Telehealth (HOSPITAL_COMMUNITY): Payer: Self-pay | Admitting: Pharmacist

## 2016-05-22 ENCOUNTER — Telehealth: Payer: Self-pay | Admitting: Licensed Clinical Social Worker

## 2016-05-22 SURGERY — RIGHT HEART CATH
Anesthesia: LOCAL

## 2016-05-22 NOTE — Telephone Encounter (Signed)
[name] stated that the pharmacy tech [facility] stated that his sildenafil still requires a PA. I have called [employer] and verified that the sildenafil is approved and should go through at the specialty pharmacy level. I have called [facility] and they verified that it went through for no charge to patient. They will call him to verify shipping and send to him ASAP. Relayed info to [name] who was grateful for the assistance.   [name], PharmD, BCPS, CPP Clinical Pharmacist Pager: [phone] Phone: [phone] 05/22/2016 2:55 PM

## 2016-05-23 ENCOUNTER — Telehealth: Payer: Self-pay | Admitting: Licensed Clinical Social Worker

## 2016-05-23 NOTE — Telephone Encounter (Signed)
CSW contacted [facility] and was informed that patient referral packet had been sent to [facility] in [place] last Friday and still awaiting feedback. CSW informed [facility] that patient was in the clinic this morning and very concerned about caregiver status if he has to go to [place]. Patient's father and sister unable to travel with him for the evaluation and his children are unable to take off from employment for extended period of time. [facility] confirms aware of caregiver issue and will await determination from [facility]. CSW continues to be available as needed. Lasandra BeechJackie Sharlette Jansma, LCSW, CCSW-MCS [phone]

## 2016-05-23 NOTE — Telephone Encounter (Signed)
CSW contacted [facility] and spoke with Beverlyn RouxAlon to inform of additional letter from CSW and LVAD Assessment sent to add to network exception. CSW informed exception in process and should hear within 24 hours. CSW will await word from [facility]. Patient continues to feel frustrated with process and concerned about his future if implant mandated in [place] by [facility]/insurance company. CSW will continue to be available for follow up. Lasandra BeechJackie Avarose Mervine, LCSW, CCSW-MCS [phone]

## 2016-05-23 NOTE — Telephone Encounter (Signed)
CSW assisted with referral packet to [facility] for LVAD Implant network exception. CSW spoke with Beverlyn Rouxlon @ [facility] [phone] and faxed over requested information per [facility]. Patient aware of request as he states he is [place] to travel [place] and has no caregivers to assist him if implanted there. CSW will continue to follow and be availbale as needed. Lasandra BeechJackie Aleeyah Bensen, LCSW, CCSW-MCS [phone]

## 2016-05-23 NOTE — Telephone Encounter (Signed)
CSW received call from patient stating that he received a call from [facility] that the network exception was denied. CSW also received a call from Karrie MeresJoanna McCormick from [facility] stating the denial for network exception. CSW was informed that the referral packet will be forwarded to [facility] in [place] and she will forward concerns of lack of caregiver as well. CSW informed if patient denied based on caregiver then she will reach back out to CSW to discuss further network exception. At this time the network exception only applies if the patient is unable to be transported to in [facility] provider. CSW will continue to be available. Lasandra BeechJackie Zebadiah Willert, LCSW, CCSW-MCS [phone]

## 2016-05-23 NOTE — Telephone Encounter (Signed)
CSW received call from patient very upset that he had received call from [facility] in [place] calling to set up his evaluation date. Patient stated that he shared that he had no caregiver to travel [place] to [place]. CSW contacted Karrie MeresJoanna McCormick at [facility] to inform and request assistance. Mardene CelesteJoanna states she will follow up and discuss further with [facility] [place] about how to proceed and return call to CSW later today. Lasandra BeechJackie Earl Losee, LCSW, CCSW-MCS [phone]

## 2016-05-24 ENCOUNTER — Encounter (HOSPITAL_COMMUNITY): Payer: BLUE CROSS/BLUE SHIELD

## 2016-05-24 ENCOUNTER — Encounter (HOSPITAL_COMMUNITY): Payer: Self-pay

## 2016-05-24 ENCOUNTER — Inpatient Hospital Stay (HOSPITAL_COMMUNITY)
Admission: AD | Admit: 2016-05-24 | Discharge: 2016-06-13 | DRG: 002 | Disposition: A | Discharge status: Home Health | Payer: BLUE CROSS/BLUE SHIELD | Source: Ambulatory Visit | Attending: Cardiology | Admitting: Cardiology

## 2016-05-24 ENCOUNTER — Encounter (HOSPITAL_COMMUNITY): Payer: Self-pay | Admitting: *Deleted

## 2016-05-24 ENCOUNTER — Telehealth: Payer: Self-pay | Admitting: Licensed Clinical Social Worker

## 2016-05-24 ENCOUNTER — Telehealth (HOSPITAL_COMMUNITY): Payer: Self-pay

## 2016-05-24 ENCOUNTER — Ambulatory Visit (HOSPITAL_COMMUNITY)
Admission: RE | Admit: 2016-05-24 | Discharge: 2016-05-24 | Disposition: A | Discharge status: Home / Self Care | Payer: BLUE CROSS/BLUE SHIELD | Source: Ambulatory Visit | Attending: Internal Medicine | Admitting: Internal Medicine

## 2016-05-24 VITALS — BP 124/68 | HR 96 | Wt 211.0 lb

## 2016-05-24 DIAGNOSIS — Z833 Family history of diabetes mellitus: Secondary | ICD-10-CM

## 2016-05-24 DIAGNOSIS — D62 Acute posthemorrhagic anemia: Secondary | ICD-10-CM | POA: Diagnosis not present

## 2016-05-24 DIAGNOSIS — I739 Peripheral vascular disease, unspecified: Secondary | ICD-10-CM

## 2016-05-24 DIAGNOSIS — I5023 Acute on chronic systolic (congestive) heart failure: Secondary | ICD-10-CM

## 2016-05-24 DIAGNOSIS — E1151 Type 2 diabetes mellitus with diabetic peripheral angiopathy without gangrene: Secondary | ICD-10-CM | POA: Diagnosis present

## 2016-05-24 DIAGNOSIS — F17210 Nicotine dependence, cigarettes, uncomplicated: Secondary | ICD-10-CM | POA: Diagnosis present

## 2016-05-24 DIAGNOSIS — Z7902 Long term (current) use of antithrombotics/antiplatelets: Secondary | ICD-10-CM | POA: Insufficient documentation

## 2016-05-24 DIAGNOSIS — I252 Old myocardial infarction: Secondary | ICD-10-CM

## 2016-05-24 DIAGNOSIS — J939 Pneumothorax, unspecified: Secondary | ICD-10-CM

## 2016-05-24 DIAGNOSIS — Z01810 Encounter for preprocedural cardiovascular examination: Secondary | ICD-10-CM | POA: Diagnosis not present

## 2016-05-24 DIAGNOSIS — Z7982 Long term (current) use of aspirin: Secondary | ICD-10-CM | POA: Insufficient documentation

## 2016-05-24 DIAGNOSIS — J90 Pleural effusion, not elsewhere classified: Secondary | ICD-10-CM

## 2016-05-24 DIAGNOSIS — J449 Chronic obstructive pulmonary disease, unspecified: Secondary | ICD-10-CM | POA: Insufficient documentation

## 2016-05-24 DIAGNOSIS — Z95811 Presence of heart assist device: Secondary | ICD-10-CM | POA: Diagnosis not present

## 2016-05-24 DIAGNOSIS — I509 Heart failure, unspecified: Secondary | ICD-10-CM

## 2016-05-24 DIAGNOSIS — R040 Epistaxis: Secondary | ICD-10-CM | POA: Diagnosis not present

## 2016-05-24 DIAGNOSIS — I5022 Chronic systolic (congestive) heart failure: Secondary | ICD-10-CM | POA: Insufficient documentation

## 2016-05-24 DIAGNOSIS — D6832 Hemorrhagic disorder due to extrinsic circulating anticoagulants: Secondary | ICD-10-CM | POA: Diagnosis not present

## 2016-05-24 DIAGNOSIS — Z794 Long term (current) use of insulin: Secondary | ICD-10-CM

## 2016-05-24 DIAGNOSIS — E785 Hyperlipidemia, unspecified: Secondary | ICD-10-CM | POA: Diagnosis not present

## 2016-05-24 DIAGNOSIS — E871 Hypo-osmolality and hyponatremia: Secondary | ICD-10-CM | POA: Diagnosis not present

## 2016-05-24 DIAGNOSIS — R570 Cardiogenic shock: Secondary | ICD-10-CM

## 2016-05-24 DIAGNOSIS — Z87891 Personal history of nicotine dependence: Secondary | ICD-10-CM | POA: Diagnosis not present

## 2016-05-24 DIAGNOSIS — I2510 Atherosclerotic heart disease of native coronary artery without angina pectoris: Secondary | ICD-10-CM | POA: Insufficient documentation

## 2016-05-24 DIAGNOSIS — E876 Hypokalemia: Secondary | ICD-10-CM | POA: Diagnosis present

## 2016-05-24 DIAGNOSIS — I2721 Secondary pulmonary arterial hypertension: Secondary | ICD-10-CM

## 2016-05-24 DIAGNOSIS — E119 Type 2 diabetes mellitus without complications: Secondary | ICD-10-CM | POA: Insufficient documentation

## 2016-05-24 DIAGNOSIS — T45525A Adverse effect of antithrombotic drugs, initial encounter: Secondary | ICD-10-CM | POA: Diagnosis not present

## 2016-05-24 DIAGNOSIS — I110 Hypertensive heart disease with heart failure: Secondary | ICD-10-CM

## 2016-05-24 DIAGNOSIS — Z79899 Other long term (current) drug therapy: Secondary | ICD-10-CM

## 2016-05-24 DIAGNOSIS — E118 Type 2 diabetes mellitus with unspecified complications: Secondary | ICD-10-CM | POA: Diagnosis present

## 2016-05-24 DIAGNOSIS — I255 Ischemic cardiomyopathy: Secondary | ICD-10-CM | POA: Diagnosis present

## 2016-05-24 DIAGNOSIS — Z955 Presence of coronary angioplasty implant and graft: Secondary | ICD-10-CM | POA: Diagnosis not present

## 2016-05-24 DIAGNOSIS — I340 Nonrheumatic mitral (valve) insufficiency: Secondary | ICD-10-CM | POA: Diagnosis not present

## 2016-05-24 DIAGNOSIS — Z95810 Presence of automatic (implantable) cardiac defibrillator: Secondary | ICD-10-CM

## 2016-05-24 DIAGNOSIS — I10 Essential (primary) hypertension: Secondary | ICD-10-CM | POA: Diagnosis not present

## 2016-05-24 DIAGNOSIS — M1990 Unspecified osteoarthritis, unspecified site: Secondary | ICD-10-CM | POA: Diagnosis present

## 2016-05-24 DIAGNOSIS — R627 Adult failure to thrive: Secondary | ICD-10-CM | POA: Diagnosis present

## 2016-05-24 DIAGNOSIS — I272 Pulmonary hypertension, unspecified: Secondary | ICD-10-CM | POA: Diagnosis not present

## 2016-05-24 DIAGNOSIS — Z452 Encounter for adjustment and management of vascular access device: Secondary | ICD-10-CM

## 2016-05-24 DIAGNOSIS — I5032 Chronic diastolic (congestive) heart failure: Secondary | ICD-10-CM | POA: Diagnosis not present

## 2016-05-24 LAB — COOXEMETRY PANEL
CARBOXYHEMOGLOBIN: 6.7 % — AB (ref 0.5–1.5)
METHEMOGLOBIN: 1 % (ref 0.0–1.5)
O2 SAT: 56.9 %
Total hemoglobin: 12.1 g/dL (ref 12.0–16.0)

## 2016-05-24 LAB — BASIC METABOLIC PANEL
Anion gap: 8 (ref 5–15)
BUN: 9 mg/dL (ref 6–20)
CALCIUM: 8.8 mg/dL — AB (ref 8.9–10.3)
CO2: 31 mmol/L (ref 22–32)
CREATININE: 0.89 mg/dL (ref 0.61–1.24)
Chloride: 96 mmol/L — ABNORMAL LOW (ref 101–111)
Glucose, Bld: 138 mg/dL — ABNORMAL HIGH (ref 65–99)
Potassium: 3.2 mmol/L — ABNORMAL LOW (ref 3.5–5.1)
SODIUM: 135 mmol/L (ref 135–145)

## 2016-05-24 LAB — DIGOXIN LEVEL: DIGOXIN LVL: 0.7 ng/mL — AB (ref 0.8–2.0)

## 2016-05-24 LAB — GLUCOSE, CAPILLARY: Glucose-Capillary: 166 mg/dL — ABNORMAL HIGH (ref 65–99)

## 2016-05-24 LAB — BRAIN NATRIURETIC PEPTIDE: B NATRIURETIC PEPTIDE 5: 374.5 pg/mL — AB (ref 0.0–100.0)

## 2016-05-24 LAB — SURGICAL PCR SCREEN
MRSA, PCR: NEGATIVE
STAPHYLOCOCCUS AUREUS: NEGATIVE

## 2016-05-24 MED ORDER — ASPIRIN EC 81 MG PO TBEC
81.0000 mg | DELAYED_RELEASE_TABLET | Freq: Every day | ORAL | Status: DC
  Administered 2016-05-25 – 2016-05-29 (×5): 81 mg via ORAL
  Filled 2016-05-24 (×5): qty 1

## 2016-05-24 MED ORDER — SODIUM CHLORIDE 0.9% FLUSH
3.0000 mL | Freq: Two times a day (BID) | INTRAVENOUS | Status: DC
  Administered 2016-05-25 – 2016-05-28 (×6): 3 mL via INTRAVENOUS
  Administered 2016-05-29: 20 mL via INTRAVENOUS

## 2016-05-24 MED ORDER — SPIRONOLACTONE 25 MG PO TABS
12.5000 mg | ORAL_TABLET | Freq: Every day | ORAL | Status: DC
  Administered 2016-05-25: 12.5 mg via ORAL
  Filled 2016-05-24: qty 1

## 2016-05-24 MED ORDER — POTASSIUM CHLORIDE CRYS ER 20 MEQ PO TBCR
40.0000 meq | EXTENDED_RELEASE_TABLET | Freq: Once | ORAL | Status: AC
  Administered 2016-05-24: 40 meq via ORAL
  Filled 2016-05-24: qty 2

## 2016-05-24 MED ORDER — MILRINONE LACTATE IN DEXTROSE 20-5 MG/100ML-% IV SOLN
0.2500 ug/kg/min | INTRAVENOUS | Status: DC
  Administered 2016-05-24 – 2016-05-30 (×9): 0.25 ug/kg/min via INTRAVENOUS
  Filled 2016-05-24 (×11): qty 100

## 2016-05-24 MED ORDER — DIGOXIN 125 MCG PO TABS
0.1250 mg | ORAL_TABLET | Freq: Every day | ORAL | Status: DC
  Administered 2016-05-25 – 2016-05-29 (×5): 0.125 mg via ORAL
  Filled 2016-05-24 (×5): qty 1

## 2016-05-24 MED ORDER — ATORVASTATIN CALCIUM 80 MG PO TABS
80.0000 mg | ORAL_TABLET | Freq: Every day | ORAL | Status: DC
  Administered 2016-05-24 – 2016-05-29 (×6): 80 mg via ORAL
  Filled 2016-05-24 (×6): qty 1

## 2016-05-24 MED ORDER — SODIUM CHLORIDE 0.9% FLUSH: 10.0000 mL | INTRAVENOUS | Status: DC | PRN

## 2016-05-24 MED ORDER — CHLORHEXIDINE GLUCONATE 0.12 % MT SOLN
10.0000 mL | Freq: Two times a day (BID) | OROMUCOSAL | Status: DC
  Administered 2016-05-25 – 2016-05-27 (×5): 10 mL via OROMUCOSAL
  Filled 2016-05-24 (×5): qty 15

## 2016-05-24 MED ORDER — ATORVASTATIN CALCIUM 80 MG PO TABS: 80.0000 mg | ORAL_TABLET | ORAL | Status: DC

## 2016-05-24 MED ORDER — ACETAMINOPHEN 325 MG PO TABS: 650.0000 mg | ORAL_TABLET | ORAL | Status: DC | PRN

## 2016-05-24 MED ORDER — SILDENAFIL CITRATE 20 MG PO TABS
20.0000 mg | ORAL_TABLET | Freq: Three times a day (TID) | ORAL | Status: DC
  Administered 2016-05-24 – 2016-05-29 (×16): 20 mg via ORAL
  Filled 2016-05-24 (×17): qty 1

## 2016-05-24 MED ORDER — TORSEMIDE 20 MG PO TABS: 100.0000 mg | ORAL_TABLET | Freq: Two times a day (BID) | ORAL | Status: DC

## 2016-05-24 MED ORDER — ONDANSETRON HCL 4 MG/2ML IJ SOLN: 4.0000 mg | Freq: Four times a day (QID) | INTRAMUSCULAR | Status: DC | PRN

## 2016-05-24 MED ORDER — INSULIN ASPART 100 UNIT/ML ~~LOC~~ SOLN
0.0000 [IU] | Freq: Every day | SUBCUTANEOUS | Status: DC
  Administered 2016-05-25: 4 [IU] via SUBCUTANEOUS
  Administered 2016-05-26 – 2016-05-27 (×2): 2 [IU] via SUBCUTANEOUS

## 2016-05-24 MED ORDER — INSULIN ASPART 100 UNIT/ML ~~LOC~~ SOLN
0.0000 [IU] | Freq: Three times a day (TID) | SUBCUTANEOUS | Status: DC
  Administered 2016-05-25: 3 [IU] via SUBCUTANEOUS
  Administered 2016-05-26: 11 [IU] via SUBCUTANEOUS
  Administered 2016-05-26: 8 [IU] via SUBCUTANEOUS
  Administered 2016-05-26: 5 [IU] via SUBCUTANEOUS
  Administered 2016-05-27: 11 [IU] via SUBCUTANEOUS
  Administered 2016-05-27: 8 [IU] via SUBCUTANEOUS
  Administered 2016-05-27: 3 [IU] via SUBCUTANEOUS

## 2016-05-24 MED ORDER — POTASSIUM CHLORIDE CRYS ER 20 MEQ PO TBCR
40.0000 meq | EXTENDED_RELEASE_TABLET | Freq: Two times a day (BID) | ORAL | Status: DC
  Administered 2016-05-25 – 2016-05-28 (×8): 40 meq via ORAL
  Filled 2016-05-24 (×9): qty 2

## 2016-05-24 MED ORDER — SODIUM CHLORIDE 0.9 % IV SOLN
250.0000 mL | INTRAVENOUS | Status: DC | PRN
  Administered 2016-05-26: 11:00:00 via INTRAVENOUS

## 2016-05-24 MED ORDER — SODIUM CHLORIDE 0.9% FLUSH: 3.0000 mL | INTRAVENOUS | Status: DC | PRN

## 2016-05-24 MED ORDER — FUROSEMIDE 10 MG/ML IJ SOLN
80.0000 mg | Freq: Two times a day (BID) | INTRAMUSCULAR | Status: DC
  Administered 2016-05-24 – 2016-05-29 (×11): 80 mg via INTRAVENOUS
  Filled 2016-05-24 (×11): qty 8

## 2016-05-24 MED ORDER — TORSEMIDE 20 MG PO TABS: 100.0000 mg | ORAL_TABLET | Freq: Two times a day (BID) | ORAL | 6 refills | Status: DC

## 2016-05-24 MED ORDER — EZETIMIBE 10 MG PO TABS
10.0000 mg | ORAL_TABLET | Freq: Every day | ORAL | Status: DC
  Administered 2016-05-25 – 2016-05-29 (×5): 10 mg via ORAL
  Filled 2016-05-24 (×6): qty 1

## 2016-05-24 MED ORDER — CHLORHEXIDINE GLUCONATE CLOTH 2 % EX PADS
6.0000 | MEDICATED_PAD | Freq: Every day | CUTANEOUS | Status: DC
  Administered 2016-05-25 – 2016-05-29 (×5): 6 via TOPICAL

## 2016-05-24 NOTE — Telephone Encounter (Signed)
Patient made aware of direct admit order per CHF team for decompensated CHF. Advised to come to admissions [facility] for direct admit from home to [facility]. Patient will be here in roughly 2 hrs. Otilio SaberAndy Tillery PA-C to place admitting orders, Dr. Marca Anconaalton McLean to be attending.  [name], Len Azeez [name], RN

## 2016-05-24 NOTE — Progress Notes (Signed)
CRITICAL VALUE ALERT  Critical Value:  Carboxyhemoglobin 6.7  Date & Time Notied:  05/24/16 1920  Provider Notified: Paged Iver NestleBhagat and Branch from cardiology, no response  Orders Received/Actions taken: Receiving RN will continue to try and follow up with cardiology for orders.  Hermina BartersBOWMAN, Tremaine Earwood M, RN

## 2016-05-24 NOTE — Progress Notes (Signed)
Advanced Heart Failure Clinic Note   PCP: Dr. Sigmund Hazel Cardiology: Dr. Jens Som HF Cardiology: Dr. Shirlee Latch  61 yo smoker with history of PAD, CAD, and ischemic cardiomyopathy presents for CHF clinic evaluation after recent admission.  Patient was admitted in 1/18 with PNA and acute on chronic systolic CHF.  He was markedly volume overloaded and also had low cardiac output requiring dobutamine use.  He had a right pleural effusion and underwent thoracentesis.  Fluid studies suggested a parapneumonic effusion.  He had cardiac cath, showing 3 vessel disease and markedly elevated filling pressures, right and left.  He was evaluated for CABG but turned down given comorbidities and lack of venous conduits.  We were able to wean him off dobutamine and discharged him with plan for outpatient PCI once he had recovered from the current hospitalization.   Admitted 01/28/16 - 02/03/16 after presenting for elective 2 vessel PCI of the LAD and OM of the circumflex. RHC at same time showed advanced HF with low cardiac index and severely elevated pressures. Diuresed with IV lasix, metolazone, and milrinone, but refused PICC line to follow coox/CVP. Underwent thoracentesis 02/01/16 with 1.8 L out. Overall diuresed 16 lbs. Discharge weight 214 lbs.  He was admitted in 4/18 with low output HF and severe NYHA class IV symptoms, started on milrinone.  Unable to titrate off milrinone and is now on at home.  He had a right chronic transudative effusion, PleurX catheter was placed.    He feels marginally better on milrinone but still very limited.  Fatigues very easily, very short of breath still.  Dyspnea waxes and wanes but having a hard time getting around at home.  He was short of breath walking into office.  He appears gray.  Weight is up 5 lbs, PleurX catheter is draining more, up to 1000 cc/day.  Orthopnea worse.  He is already on torsemide 100 qam/80 qpm.  He was presented at [facility],  with plan for urgent LVAD placement  on 5/22.  Unfortunately, his insurance wants him to go to [facility] and denied LVAD placement here.  Patient's only caregiver is his father who is elderly but fairly healthy. Given responsibilities at home he is completely unable to travel down to [place] with Mr Marchetta.  Daughter is leaving the country and son has 3 children under 3 and is unable to travel.  He had almost quit smoking, but with stress of LVAD cancellation, he is smoking more now.  He is feeling discouraged.   Corevue: Impedance has trended down, activity level down significantly.   Labs (1/18): K 4.2, creatinine 0.9, hgb 12.6 Labs (2/18): K 4.2, creatinine 1.23, hgb 12.4, BNP 321 Labs (4/18): hgbA1c 8.9, TSH elevated but free T4 normal.  K 3.2, creatinine 0.8 Labs (5/18): digoxin 0.4, K 3.4, creatinine 0.9  ECGs (personally reviewed): NSR, IVCD 142 msec (4/18) => NSR, IVCD QRS 162 msec (5/18)  PMH: 1. PAD: Left fem-pop bypass with graft in 2017.  2. CAD: Anterior MI 2005 => PCI to LAD followed by staged PCI to LCx and RCA.   - LHC (1/18): Diffuse up to 80% calcified stenosis throughout the proximal LAD.  Diffuse disease in distal LAD up to 50%.  Large OM1 totally occluded at the ostium but reconstituted via collaterals.  80% distal RCA stenosis involving the ostia of the PLV and PDA with about 70% stenosis. Patient had PCI with DES to proximal LAD and PTCA OM1 (unable to place stent).  3. Chronic systolic CHF: Ischemic  cardiomyopathy.  St Jude ICD.  - Echo (1/18) with EF 20%, mildly decreased RV systolic function.  - RHC (1/18) with mean RA 21, PA 69/31 mean 45, mean PCWP 27, CI 2.37, PVR 3.3 WU.  - Repeat RHC (1/18) with mean RA 16, mean PCWP 21, CI 1.92 - Echo (3/18) with EF 20-25%, mild MR, mild RV dilation with normal systolic function, PASP 47 mmHg.  - 4/18 admission with low output HF and milrinone gtt started (co-ox as low as 42%).  - RHC on milrinone (4/18): mean RA 5, PA 61/24 mean 40, mean PCWP 17, CI 2.1 Fick  with PVR 5.1 WU, CI 1.84 thermo with PVR 5.9 WU.  4. Carotid stenosis: Carotid dopplers (8/16) with 60-79% RICA, 40-59% LICA.   - Carotid dopplers (2/18) with 40-59% RICA stenosis.  5. Type II diabetes. 6. Hyperlipidemia. 7. HTN 8. COPD:  PFTs (5/18) with moderate to severe obstructive disease.  Active smoker. 9. Chronic right pleural effusion (transudative) with PleurX catheter.   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  Smoking around 1/2 ppd (has cut back).  Occasional ETOH.   Family History  Problem Relation Age of Onset  . Diabetes Mother   . Coronary artery disease Father   . Heart disease Father        before age 62   ROS: All systems reviewed and negative except as per HPI.   Current Outpatient Prescriptions  Medication Sig Dispense Refill  . aspirin 81 MG tablet Take 1 tablet (81 mg total) by mouth daily. 30 tablet 3  . atorvastatin (LIPITOR) 80 MG tablet Take 1 tablet (80 mg total) by mouth every other day. 30 tablet 11  . chlorhexidine (PERIDEX) 0.12 % solution Rinse with 15 mls twice daily for 30 seconds. Use after breakfast and at bedtime. Spit out excess. Do not swallow. 960 mL prn  . clopidogrel (PLAVIX) 75 MG tablet Take 1 tablet (75 mg total) by mouth daily. 30 tablet 6  . digoxin (LANOXIN) 0.125 MG tablet Take 1 tablet (0.125 mg total) by mouth daily. 30 tablet 11  . ezetimibe (ZETIA) 10 MG tablet Take 1 tablet (10 mg total) by mouth daily. 30 tablet 11  . insulin NPH-regular Human (NOVOLIN 70/30) (70-30) 100 UNIT/ML injection Inject 10-30 Units into the skin 3 (three) times daily. 30 units in the morning, 20 units in the afternoon, 10 units in the evening    . milrinone (PRIMACOR) 20 MG/100 ML SOLN infusion Inject 24.5 mcg/min into the vein continuous. 100 mL 0  . potassium chloride SA (K-DUR,KLOR-CON) 20 MEQ tablet Take 40meq in the AM and 20meq in the PM by mouth daily. 90 tablet 6  . sildenafil (REVATIO) 20 MG tablet Take 1 tablet (20 mg total) by  mouth 3 (three) times daily. 90 tablet 11  . spironolactone (ALDACTONE) 25 MG tablet Take 0.5 tablets (12.5 mg total) by mouth daily. 15 tablet 6  . torsemide (DEMADEX) 20 MG tablet Take 5 tabs in AM and 4 tabs in PM 240 tablet 6   No current facility-administered medications for this encounter.    BP 124/68   Pulse 96   Wt 211 lb (95.7 kg)   SpO2 100%   BMI 28.62 kg/m    Wt Readings from Last 3 Encounters:  05/24/16 211 lb (95.7 kg)  05/17/16 217 lb (98.4 kg)  05/10/16 206 lb (93.4 kg)    General: NAD Neck: JVP 8-9 cm, no thyromegaly or thyroid nodule.  Lungs: Decreased breath sounds right base.   CV: Nondisplaced PMI.  Heart regular S1/S2, no S3/S4, 1/6 early SEM RUSB.  No carotid bruit.  Unable to palpate pedal pulses. Abdomen: Soft, NT, ND, no HSM. No bruits or masses. +BS  Skin:  Changes of venous stasis dermatitis noted. 1+ edema 1/2 to knees bilaterally. Neurologic: Alert and oriented x 3.  Psych: Normal affect. Extremities: No clubbing or cyanosis.  HEENT: Normal.   Assessment/Plan: 1. Chronic systolic CHF: Ischemic cardiomyopathy.  Echo (1/18) with EF 20%, mildly decreased RV systolic function, Echo in 3/18 with EF 20-25%.  He did not have much effect from 12/17 PCI in terms of symptoms or EF.  St Jude ICD.  He remains volume overloaded on exam with weight up and increased PleurX catheter drainage despite increase in torsemide with NYHA class IV symptoms.  He is now dependent on home milrinone but starting to fail despite milrinone.  NYHA class IV.  He is very limited.  He needs LVAD placement.  We had to cancel placement this week due to insurance issues.  He looks gray today and quite symptomatic.  I offered him admission today but he wants to hold off and to try to reschedule LVAD for next week with RHC prior (he will have to stop Plavix again prior regardless).  If he worsens, I insisted that he come to the hospital.  I do not think that he is safe to travel to [place] for  LVAD evaluation.  He is not going to be able to work out the logistics of having family come down there with him and he is not going to go without them. I am very concerned that we are running out of time with Mr Gude and that he will not survive the attempt to arrange for him and family to go down to [place].  - Continue milrinone 0.25 mcg/kg/min - Increase torsemide to 100 mg bid.  BMET today and again in 2 wks.  - Continue digoxin 0.125 mg daily, check level today.  - He is now off losartan and Coreg with hypotension and low output.   - Continue spironolactone 12.5 mg daily. - Most recent ECG shows widening of QRS out to 162 msec from 142 msec in 4/18 (IVCD).  I do not think that CRT upgrade is going to be enough to avoid LVAD (he was already markedly symptomatic with the narrower QRS).  - See discussion above.  If we can get his insurance to agree, will have him hold Plavix (>3 months post-PCI), get RHC on Tuesday then place HVAD.  His father will be his caregiver.   2. CAD: 3 vessel CAD, s/p stent to proximal LAD and PTCA to subtotally occluded large OM1 in 1/18. No change in symptoms or EF with intervention.  - Continue plavix 75 mg daily.  As above, will stop prior to LVAD placement. This will be > 3 months post PCI with a new generation DES.  - Continue ASA 81 and atorvastatin 80 mg daily.   3. PAD: Stable claudication, notes walking up a hill in past. Not having any recently as not very active.  - He follows with [facility]. No change to current plans.  4. Carotid stenosis:  Repeat carotid dopplers in 2/19.  5. R Pleural effusion: Chronic, transudate, has PleurX catheter in place. He will keep this in place through surgery.  This has been draining more recently suggesting more fluid overload.  6. Pulmonary arterial HTN: Noted on RHC 05/01/16.  Revatio 20 mg tid started.  Continue through LVAD surgery.  7. Smoking: I strongly encouraged him to stop smoking prior to surgery.  He had cut back a lot,  but unfortunately the stress from his insurance company has led him to smoke more.   We will appeal again with his insurance as it is probably going to be an insurmountable hardship to get him down to [place] socially, and I do not think that he is safe medically to travel  Marca Ancona 05/24/2016

## 2016-05-24 NOTE — Addendum Note (Signed)
Encounter addended by: Laurey MoraleMcLean, Dalton S, MD on: 05/24/2016  9:19 PM<BR>    Actions taken: Charge Capture section accepted

## 2016-05-24 NOTE — H&P (Signed)
Advanced Heart Failure Team History and Physical Note   Primary Physician:  Dr. Sigmund Hazel Primary Cardiologist:  Dr. Shirlee Latch   Reason for Admission: Acute on chronic systolic CHF   HPI:    [name] is a 61 y.o. male with history of Chronic systolic CHF due to ICM,  Echo in 3/18 with EF 20-25%, 3 CAD stent to proximal LAD and PTCA to subtotally occluded large OM1 in 1/18, PAD with claudication, Carotid stenosis, R pleural effusion s/p PleurX catheter placement, Pulmonary Arterial HTN, and Tobacco abuse.   Pt has had rapid decline over past few months including requirement of PleurX catheter due to significant R pleural effusion and decompensation to NYHA Class IV symptoms. Pt has undergone LVAD work up and been approved for VAD placement by [employer] [facility], but was initially refused due to "out of network" insurance through [place].   Pt seen in clinic this am.  [name], easily fatigued and dyspneic with minimal exertion and occasionally at rest.  Weight rising. PleurX catheter drainage increasing up to 1000 cc/day +. Worsening orthopnea despite increase in torsemide. Insurance wants him to go to [place], [place] for LVAD placement but we are worried he would not survive the transition, nor does he have a caregiver capable of making the trip.   He was initially offered admission but refused, got home and felt worse so agreed.   Review of systems complete and found to be negative unless listed in HPI.    Home Medications Prior to Admission medications   Medication Sig Start Date End Date Taking? Authorizing Provider  aspirin 81 MG tablet Take 1 tablet (81 mg total) by mouth daily. 10/07/12   [name], MD  atorvastatin (LIPITOR) 80 MG tablet Take 1 tablet (80 mg total) by mouth every other day. 04/25/16   Bensimhon, Bevelyn Buckles, MD  chlorhexidine (PERIDEX) 0.12 % solution Rinse with 15 mls twice daily for 30 seconds. Use after breakfast and at bedtime. Spit out excess. Do not  swallow. 05/08/16   Charlynne Pander, DDS  clopidogrel (PLAVIX) 75 MG tablet Take 1 tablet (75 mg total) by mouth daily. 01/18/16   Laurey Morale, MD  digoxin (LANOXIN) 0.125 MG tablet Take 1 tablet (0.125 mg total) by mouth daily. 02/18/16   Bensimhon, Bevelyn Buckles, MD  ezetimibe (ZETIA) 10 MG tablet Take 1 tablet (10 mg total) by mouth daily. 04/25/16   Bensimhon, Bevelyn Buckles, MD  insulin NPH-regular Human (NOVOLIN 70/30) (70-30) 100 UNIT/ML injection Inject 10-30 Units into the skin 3 (three) times daily. 30 units in the morning, 20 units in the afternoon, 10 units in the evening    [provider]  milrinone (PRIMACOR) 20 MG/100 ML SOLN infusion Inject 24.5 mcg/min into the vein continuous. 04/10/16   Graciella Freer, PA-C  potassium chloride SA (K-DUR,KLOR-CON) 20 MEQ tablet Take in the AM and in the PM by mouth daily. 05/12/16   Bensimhon, Bevelyn Buckles, MD  sildenafil (REVATIO) 20 MG tablet Take 1 tablet (20 mg total) by mouth 3 (three) times daily. 05/02/16   Laurey Morale, MD  spironolactone (ALDACTONE) 25 MG tablet Take 0.5 tablets (12.5 mg total) by mouth daily. 04/10/16   Graciella Freer, PA-C  torsemide (DEMADEX) 20 MG tablet Take 5 tablets (100 mg total) by mouth 2 (two) times daily. 05/24/16   Bensimhon, Bevelyn Buckles, MD    Past Medical History: Past Medical History:  Diagnosis Date  . AICD (  automatic cardioverter/defibrillator) present   . Arthritis   . Barretts syndrome   . Carotid artery disease (HCC)    a. Dopp 09/2011: 60-79% RICA, 40-59% LICA.;  b.  Carotid US (7/14):  Bilateral 60-79% => f/u 6 mos  . Cerebrovascular disease   . Chronic systolic heart failure (HCC)   . Coronary artery disease    a. AWMI requiring IABP 2005 s/p Horizon study stent-LAD, staged BMS-Cx, DESx2-RCA. b. Last Last LHC (6/06):  EF 25%, pLAD stent 40-50, after stent 40, dLAD 20, pCFX 20, pOM1 70, pRCA 20, mRCA stents ok.=> med Rx;  c.  Myoview (7/14):  EF 26%, large ant, septal, inf  and apical infarct, no ischemia.  Med Rx continued  . Diabetes mellitus   . Dyspnea    with exertion  . Headache   . Heart murmur   . History of kidney stones   . HTN (hypertension)   . Hyperlipidemia    mixed  . ICD (implantable cardiac defibrillator), dual, in situ    St. Jude for severe LVD EF 25% 2/07 explanted 2010. Medtronic Virtuoso II DR Dual-chamber cardiverter-defibrillator  with pocket revision, Dr. Graciela HusbandsKlein  . Ischemic cardiomyopathy    a. Echo (09/27/12): EF 20%, diffuse HK with periapical AK, no LV thrombus noted, restrictive physiology, trivial MR, mild LAE, RVSF mildly reduced, PASP 39  . Pleural effusion 01/05/2016  . Presence of permanent cardiac pacemaker   . PVD (peripheral vascular disease) (HCC)    a. ABI 09/2011 - R normal, L moderate - saw Dr. Kirke CorinArida - med rx.   Marland Kitchen. RIATA ICD Lead--on advisory recall    . Tobacco abuse     Past Surgical History: Past Surgical History:  Procedure Laterality Date  . ABDOMINAL AORTAGRAM N/A 11/06/2013   Procedure: ABDOMINAL Ronny FlurryAORTAGRAM;  Surgeon: Chuck Hinthristopher S Dickson, MD;  [facility];  Service: Cardiovascular;  Laterality: N/A;  . APPENDECTOMY  2000   ruptured  . CARDIAC CATHETERIZATION N/A 01/07/2016   Procedure: Right/Left Heart Cath and Coronary Angiography;  Surgeon: Laurey Moralealton S Kharee Lesesne, MD;  [facility];  Service: Cardiovascular;  Laterality: N/A;  . CARDIAC CATHETERIZATION N/A 01/28/2016   Procedure: Coronary Stent Intervention;  Surgeon: Tonny BollmanMichael Cooper, MD;  [facility];  Service: Cardiovascular;  Laterality: N/A;  . CARDIAC CATHETERIZATION N/A 01/28/2016   Procedure: Right Heart Cath;  Surgeon: Tonny BollmanMichael Cooper, MD;  [facility];  Service: Cardiovascular;  Laterality: N/A;  . CARDIAC CATHETERIZATION N/A 01/28/2016   Procedure: Intravascular Ultrasound/IVUS;  Surgeon: Tonny BollmanMichael Cooper, MD;  [facility];  Service: Cardiovascular;  Laterality: N/A;  . CHEST TUBE  INSERTION Right 04/07/2016   Procedure: INSERTION PLEURAL DRAINAGE CATHETER;  Surgeon: Kerin PernaPeter Van Trigt, MD;  [facility];  Service: Thoracic;  Laterality: Right;  . FEMORAL-POPLITEAL BYPASS GRAFT Right 11/07/2013   Procedure: Right FEMORAL-POPLITEAL ARTERY Bypass ;  Surgeon: Chuck Hinthristopher S Dickson, MD;  [facility];  Service: Vascular;  Laterality: Right;  . FEMORAL-POPLITEAL BYPASS GRAFT Left 11/17/2014   Procedure: BYPASS GRAFT FEMORAL-POPLITEAL ARTERY USING GORE PROPATEN 6MM X 80CM VASCULAR GRAFT;  Surgeon: Chuck Hinthristopher S Dickson, MD;  [facility];  Service: Vascular;  Laterality: Left;  . IMPLANTABLE CARDIOVERTER DEFIBRILLATOR (ICD) GENERATOR CHANGE N/A 12/16/2012   Procedure: ICD GENERATOR CHANGE;  Surgeon: Duke SalviaSteven C Klein, MD;  [facility];  Service: Cardiovascular;  Laterality: N/A;  . INTRAOPERATIVE ARTERIOGRAM Right 11/07/2013   Procedure: INTRA OPERATIVE ARTERIOGRAM;  Surgeon: Cristal Deerhristopher  Adele Dan, MD;  Location: [facility] OR;  Service: Vascular;  Laterality: Right;  . LOWER EXTREMITY ANGIOGRAM Bilateral 11/06/2013   Procedure: LOWER EXTREMITY ANGIOGRAM;  Surgeon: Chuck Hint, MD;  Location: [facility] CATH LAB;  Service: Cardiovascular;  Laterality: Bilateral;  . Medtronic Virtuoso II DR dual-chamber cardioverter-defibrillation with pocket revison     Dr. Sherryl Manges  . MULTIPLE EXTRACTIONS WITH ALVEOLOPLASTY N/A 04/26/2016   Procedure: Extraction of tooth #'s 5-14 and 20-30 with alveoloplasty;  Surgeon: Charlynne Pander, DDS;  Location: [facility] OR;  Service: Oral Surgery;  Laterality: N/A;  . PERIPHERAL VASCULAR CATHETERIZATION N/A 09/25/2014   Procedure: Abdominal Aortogram;  Surgeon: Sherren Kerns, MD;  [facility] INV[facility] CV LAB;  Service: Cardiovascular;  Laterality: N/A;  . RIGHT HEART CATH N/A 05/01/2016   Procedure: Right Heart Cath;  Surgeon: Laurey Morale, MD;  Location: [facility] INVASIVE CV LAB;  Service: Cardiovascular;  Laterality: N/A;  . US GUIDED THORACENTESIS  RIGHT ([facility] HX) Right 01/05/2016    Family History: Family History  Problem Relation Age of Onset  . Diabetes Mother   . Coronary artery disease Father   . Heart disease Father        before age 62    Social History: Social History   Social History  . Marital status: Divorced    Spouse name: N/A  . Number of children: 2  . Years of education: N/A   Occupational History  . 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 Smoking status: Current Every Day Smoker    Packs/day: 0.50    Years: 45.00    Types: Cigarettes  . Smokeless tobacco: Never Used     Comment: pt reports he is stress smoker  . Alcohol use No  . Drug use: No     Comment: former Cocain, Acid, Marijuana - 25 years ago  . Sexual activity: No   Other Topics Concern  . Not on file   Social History Narrative   Divorced. 2 children.   Previously worked at [employer].    Allergies:  Allergies  Allergen Reactions  . Metformin And Related Nausea And Vomiting    *Only the extended release* (does this)    Objective:    Vital Signs:   BP 124/68 HR 96 Wt 211 lbs Sp02 100% BMI 28.62 kg/m2  Physical Exam: General:  Chronically ill and appearing. Dyspneic with conversation.  HEENT: Pale, grey. Neck: Supple. JVP 8-9 cm. Carotids 2+ bilat; no bruits. No lymphadenopathy or thyromegaly appreciated. Cor: PMI nondisplaced. Regular rate & rhythm. 1/6 early SEM RUSB. Lungs: Diminished  Abdomen: soft, nontender, nondistended. No hepatosplenomegaly. No bruits or masses. Good bowel sounds. Extremities: no cyanosis, clubbing, or rash. 1+ edema 1/2 knees bilaterally.  Neuro: alert & oriented x 3, cranial nerves grossly intact. moves all 4 extremities w/o difficulty. Affect pleasant.  Telemetry: Not yet connected  Labs: Basic Metabolic Panel:  Recent Labs Lab 05/24/16 1120  NA 135  K 3.2*  CL 96*  CO2 31  GLUCOSE 138*  BUN 9  CREATININE 0.89  CALCIUM 8.8*    Liver Function Tests: No results for input(s): AST, ALT, ALKPHOS, BILITOT, PROT, ALBUMIN in the last 168 hours. No results for input(s): LIPASE, AMYLASE in the last 168 hours. No results for input(s): AMMONIA in the last 168 hours.  CBC: No results for input(s): WBC, NEUTROABS, HGB, HCT, MCV, PLT in the last 168 hours.  Cardiac Enzymes:  No results for input(s): CKTOTAL, CKMB, CKMBINDEX, TROPONINI in the last 168 hours.  BNP: BNP (last 3 results)  Recent Labs  04/05/16 1221 04/21/16 1208 05/24/16 1120  BNP 444.6* 356.2* 374.5*   ProBNP (last 3 results) No results for input(s): PROBNP in the last 8760 hours.  CBG: No results for input(s): GLUCAP in the last 168 hours.  Coagulation Studies: No results for input(s): LABPROT, INR in the last 72 hours.  Other results: EKG: 05/17/16 NSR 86 bpm  Imaging:  No results found.  Assessment/Plan   WEST BOOMERSHINE is a 61 y.o. male with history of Chronic systolic CHF due to ICM,  Echo in 3/18 with EF 20-25%, 3 CAD stent to proximal LAD and PTCA to subtotally occluded large OM1 in 1/18, PAD with claudication, Carotid stenosis, R pleural effusion s/p PleurX catheter placement, Pulmonary Arterial HTN, and Tobacco abuse. Pt is being admitted with acute on chronic systolic CHF and NYHA IV symptoms  1. Acute on chronic systolic CHF due to ICM - Echo 01/2016 with LVEF 20%, mildly decreased systolic function, Echo in 3/18 with EF 20-25%.  - Minimal improvement s/p PCI in 12/17  - s/p St Jude ICD.  - Remains volume overloaded on exam with weight up and increase drainage from Pleurx cath. - He now has Class NYHA IV symptoms despite increased torsemide and use of milrinone.  - Pt needs urgent LVAD placement but has been delayed due to insurance issues.    - Initially refused admission but now willing to consider, feeling worse.  Pt would not tolerate transport to out-of-state facility, nor does he have a caregiver capable of travelling with  him.  - Continue milrinone 0.25 mcg/kg/min. Check coox. May need to increase.  - Will give IV Lasix for now give volume overload.  - Continue digoxin 0.125 mg daily. Level 0.7 this am.  - Hold losartan and coreg with hypotension and low output.  - Most recent ECG shows widening of QRS out to 162 msec from 142 msec in 4/18 (IVCD) Do not think CRT upgrade woult be enough to avoid LVAD  - Plan RHC tomorrow, leave Swan in place to optimize for LVAD next week.    2. CAD - 3 vessel CAD s/p stent to proximal LAD and PTCA to subtotally occluded large OM1 in 1/18. No change in symptoms or EF with intervention.  - Hold Plavix for now, hopefully LVAD on Tuesday next week. - Has been > 3 months post PCI with a new generation DES.  - Continue ASA 81 mg and atorvastatin 80 mg daily.  3. PAD: - Stable, claudication.  - He follows with [facility]. No change to current plans.  4. Carotid stenosis - Repeat carotid dopplers in 02/2017 5. R Pleural effusion - Chronic, transudate.  - Has PleurX in place. Will keep this in place through surgery.   - Drainage has increased recently suggesting more fluid overload.  6. Pulmonary Arterial HTN  - Noted on RHC 05/01/16.  - Continue Revatio 20 mg TID through LVAD surgery.  7. Smoking:  - Have strongly encouraged cessation.   Length of Stay: 0  Luane School 05/24/2016, 4:28 PM  Advanced Heart Failure Team Pager [phone] (M-F; 7a - 4p)  Please contact [facility] for night-coverage after hours (4p -7a ) and weekends on [email or web address]  Patient seen with PA, agree with the above note.  See note from clinic earlier today.  Patient felt worse at home and returned for admission.  I do not think that he is safe to travel out of state for LVAD and does not have family who can go with him.   - We will start Lasix 80 mg IV bid, replace K.  - Continue milrinone gtt. - Plan RHC tomorrow, place Swan to optimize hemodynamics.  - Will hold Plavix and tentatively plan  LVAD on Tuesday if we can get insurance approval.   Marca Ancona 05/24/2016 9:15 PM

## 2016-05-24 NOTE — Telephone Encounter (Signed)
CSW received call from Sri [place] at [facility] stating she had received letter from Dr. Gala RomneyBensimhon requesting urgent network exception as [facility]. Skipper Clicheana asking appropriate questions regarding condition of patient and CSW suggested she speak further with Dr.Bensimhon. CSW relayed call to MD who spoke with Sri Lankaana and then her supervisor Elza RafterShannon McFarland [phone] to coordinate discussions for network exception. Dr. Gala RomneyBensimhon was requested to contact [facility] [place] to discuss case peer to peer regarding feasibility of hospital to hospital transfer as patient is now inpatient. [facility] denied [facility] agreeing that patient was too critical for transfer for DT LVAD implant. CSW received return call from Sri Lankaana from [facility] confirming conversation between Dr. Gala RomneyBensimhon and [facility] and stated she is awaiting documentation from [facility] to forward to network exception to finalize process and approval. Skipper Clicheana anticipates confirmation of network exception by tomorrow morning and requested fax number for [facility] clinic to send approval.  CSW continues to be available as needed and follow up as needed.  Lasandra BeechJackie Shelbylynn Walczyk, LCSW, CCSW-MCS [phone]

## 2016-05-24 NOTE — Progress Notes (Addendum)
CSW met with patient in the clinic. Patient reports he is very frustrated with the process for insurance authorization for the LVAD. Patient states he was told yesterday that he would need to travel to [facility] in [place], [place] for an evaluation for LVAD implant as they are the in-network provider.   Patient states he is unable to travel alone due to his current medical status which he is on home inotropes and very SOB. Patient's caregiver will be his 59yo father who is able to provide care for patient here in [place], [place] although unable to travel out of town due to responsibilities of a blind horse and other farm animals for which there is no other options for caregiving. Patient's back up caregiver will be his sister who is the primary caregiver for patient's mother who has dementia. Patient's sister unable to travel with him as well due to her caregiving responsibilities of the mother. Patient states he has two children although his daughter is going out of the country on a mission trip in the next few weeks and his son has 3 small children and unable to leave for an extended period of time. Patient spoke of the stress added with the insurance challenges and he worries that he will be "forced" to go to [place] where he will have no family support to assist with his recovery. Patient verbalizes worries that he will not make it to [facility] for evaluation as he is fearful of traveling alone due to concerns of sudden death. Patient strongly wishes to pursue LVAD implant at [facility] where he has established care and trust with providers and will require lifetime follow up care post LVAD implant.   Patient has requested [place] to assist with network exception appeal for approval for LVAD implant as soon as possible and he was scheduled tentatively for implant on May 23, 2016. CSW provided supportive intervention and obtained patient's permission to send today's notes to [facility] for  network exception request. Patient appears frustrated and very concerned about his health and mortality. CSW will forward to [facility] and continue to provide support to patient as needed.   Raquel Sarna, [place], [place]

## 2016-05-24 NOTE — Addendum Note (Signed)
Encounter addended by: Marcy SirenBrennan, Kameela Leipold S, LCSW on: 05/24/2016 12:09 PM<BR>    Actions taken: Sign clinical note

## 2016-05-24 NOTE — Patient Instructions (Signed)
Increase Torsemide to 100 mg (5 tabs) Twice daily   Labs today  Your physician recommends that you schedule a follow-up appointment in: 2 weeks with labs

## 2016-05-25 ENCOUNTER — Inpatient Hospital Stay (HOSPITAL_COMMUNITY): Payer: BLUE CROSS/BLUE SHIELD

## 2016-05-25 ENCOUNTER — Encounter (HOSPITAL_COMMUNITY): Payer: Self-pay

## 2016-05-25 ENCOUNTER — Encounter (HOSPITAL_COMMUNITY)
Admission: AD | Disposition: A | Discharge status: Home Health | Payer: Self-pay | Source: Ambulatory Visit | Attending: Cardiology

## 2016-05-25 ENCOUNTER — Ambulatory Visit (HOSPITAL_COMMUNITY): Admission: RE | Admit: 2016-05-25 | Payer: BLUE CROSS/BLUE SHIELD | Source: Ambulatory Visit | Admitting: Cardiology

## 2016-05-25 DIAGNOSIS — E785 Hyperlipidemia, unspecified: Secondary | ICD-10-CM

## 2016-05-25 DIAGNOSIS — I509 Heart failure, unspecified: Secondary | ICD-10-CM

## 2016-05-25 HISTORY — PX: RIGHT HEART CATH: CATH118263

## 2016-05-25 LAB — GLUCOSE, CAPILLARY
GLUCOSE-CAPILLARY: 191 mg/dL — AB (ref 65–99)
GLUCOSE-CAPILLARY: 195 mg/dL — AB (ref 65–99)
GLUCOSE-CAPILLARY: 177 mg/dL — AB (ref 65–99)
Glucose-Capillary: 304 mg/dL — ABNORMAL HIGH (ref 65–99)

## 2016-05-25 LAB — COMPREHENSIVE METABOLIC PANEL
ALT: 16 U/L — ABNORMAL LOW (ref 17–63)
AST: 16 U/L (ref 15–41)
Albumin: 2.6 g/dL — ABNORMAL LOW (ref 3.5–5.0)
Alkaline Phosphatase: 95 U/L (ref 38–126)
Anion gap: 8 (ref 5–15)
BUN: 8 mg/dL (ref 6–20)
CO2: 30 mmol/L (ref 22–32)
Calcium: 8.1 mg/dL — ABNORMAL LOW (ref 8.9–10.3)
Chloride: 97 mmol/L — ABNORMAL LOW (ref 101–111)
Creatinine, Ser: 0.69 mg/dL (ref 0.61–1.24)
GFR calc Af Amer: >60 mL/min (ref >60)
GFR calc non Af Amer: >60 mL/min (ref >60)
Glucose, Bld: 181 mg/dL — ABNORMAL HIGH (ref 65–99)
Potassium: 2.9 mmol/L — ABNORMAL LOW (ref 3.5–5.1)
Sodium: 135 mmol/L (ref 135–145)
Total Bilirubin: 0.5 mg/dL (ref 0.3–1.2)
Total Protein: 6.2 g/dL — ABNORMAL LOW (ref 6.5–8.1)

## 2016-05-25 LAB — PROTIME-INR
INR: 1.15
PROTHROMBIN TIME: 14.7 s (ref 11.4–15.2)

## 2016-05-25 LAB — POCT I-STAT 3, VENOUS BLOOD GAS (G3P V)
ACID-BASE EXCESS: 5 mmol/L — AB (ref 0.0–2.0)
Acid-Base Excess: 5 mmol/L — ABNORMAL HIGH (ref 0.0–2.0)
Bicarbonate: 30.1 mmol/L — ABNORMAL HIGH (ref 20.0–28.0)
Bicarbonate: 30.1 mmol/L — ABNORMAL HIGH (ref 20.0–28.0)
O2 SAT: 71 %
O2 Saturation: 70 %
PH VEN: 7.415 (ref 7.250–7.430)
PO2 VEN: 37 mmHg (ref 32.0–45.0)
TCO2: 32 mmol/L (ref 0–100)
TCO2: 32 mmol/L (ref 0–100)
pCO2, Ven: 46.6 mmHg (ref 44.0–60.0)
pCO2, Ven: 46.9 mmHg (ref 44.0–60.0)
pH, Ven: 7.419 (ref 7.250–7.430)
pO2, Ven: 37 mmHg (ref 32.0–45.0)

## 2016-05-25 LAB — COOXEMETRY PANEL
CARBOXYHEMOGLOBIN: 3.7 % — AB (ref 0.5–1.5)
METHEMOGLOBIN: 0.9 % (ref 0.0–1.5)
O2 Saturation: 63.9 %
Total hemoglobin: 13.1 g/dL (ref 12.0–16.0)

## 2016-05-25 LAB — URINALYSIS, ROUTINE W REFLEX MICROSCOPIC
Bilirubin Urine: NEGATIVE
Glucose, UA: NEGATIVE mg/dL
Ketones, ur: NEGATIVE mg/dL
Nitrite: NEGATIVE
Protein, ur: NEGATIVE mg/dL
Specific Gravity, Urine: 1.011 (ref 1.005–1.030)
Squamous Epithelial / LPF: NONE SEEN
pH: 6 (ref 5.0–8.0)

## 2016-05-25 LAB — CBC WITH DIFFERENTIAL/PLATELET
BASOS PCT: 0 %
Basophils Absolute: 0 10*3/uL (ref 0.0–0.1)
EOS ABS: 0.4 10*3/uL (ref 0.0–0.7)
EOS PCT: 5 %
HCT: 35.8 % — ABNORMAL LOW (ref 39.0–52.0)
HEMOGLOBIN: 11.6 g/dL — AB (ref 13.0–17.0)
Lymphocytes Relative: 12 %
Lymphs Abs: 0.9 10*3/uL (ref 0.7–4.0)
MCH: 28.9 pg (ref 26.0–34.0)
MCHC: 32.4 g/dL (ref 30.0–36.0)
MCV: 89.1 fL (ref 78.0–100.0)
MONOS PCT: 10 %
Monocytes Absolute: 0.8 10*3/uL (ref 0.1–1.0)
NEUTROS PCT: 73 %
Neutro Abs: 5.5 10*3/uL (ref 1.7–7.7)
PLATELETS: 165 10*3/uL (ref 150–400)
RBC: 4.02 MIL/uL — AB (ref 4.22–5.81)
RDW: 16.9 % — ABNORMAL HIGH (ref 11.5–15.5)
WBC: 7.6 10*3/uL (ref 4.0–10.5)

## 2016-05-25 LAB — AMMONIA: Ammonia: 16 umol/L (ref 9–35)

## 2016-05-25 LAB — PLATELET INHIBITION P2Y12: Platelet Function  P2Y12: 60 [PRU] — ABNORMAL LOW (ref 194–418)

## 2016-05-25 LAB — PREALBUMIN: Prealbumin: 10.4 mg/dL — ABNORMAL LOW (ref 18–38)

## 2016-05-25 SURGERY — RIGHT HEART CATH
Anesthesia: LOCAL

## 2016-05-25 MED ORDER — LIDOCAINE HCL (PF) 1 % IJ SOLN
INTRAMUSCULAR | Status: AC
  Filled 2016-05-25: qty 30

## 2016-05-25 MED ORDER — SODIUM CHLORIDE 0.9 % IV SOLN
INTRAVENOUS | Status: AC | PRN
  Administered 2016-05-25: 10 mL/h via INTRAVENOUS

## 2016-05-25 MED ORDER — SODIUM CHLORIDE 0.9% FLUSH
3.0000 mL | Freq: Two times a day (BID) | INTRAVENOUS | Status: DC
  Administered 2016-05-25: 3 mL via INTRAVENOUS

## 2016-05-25 MED ORDER — GLUCERNA SHAKE PO LIQD
237.0000 mL | Freq: Three times a day (TID) | ORAL | Status: DC
  Administered 2016-05-25 – 2016-05-29 (×6): 237 mL via ORAL
  Filled 2016-05-25: qty 237

## 2016-05-25 MED ORDER — POTASSIUM CHLORIDE CRYS ER 20 MEQ PO TBCR
40.0000 meq | EXTENDED_RELEASE_TABLET | Freq: Once | ORAL | Status: AC
  Administered 2016-05-25: 40 meq via ORAL
  Filled 2016-05-25: qty 2

## 2016-05-25 MED ORDER — FENTANYL CITRATE (PF) 100 MCG/2ML IJ SOLN
INTRAMUSCULAR | Status: DC | PRN
  Administered 2016-05-25: 25 ug via INTRAVENOUS

## 2016-05-25 MED ORDER — MIDAZOLAM HCL 2 MG/2ML IJ SOLN
INTRAMUSCULAR | Status: AC
  Filled 2016-05-25: qty 2

## 2016-05-25 MED ORDER — SODIUM CHLORIDE 0.9% FLUSH: 3.0000 mL | INTRAVENOUS | Status: DC | PRN

## 2016-05-25 MED ORDER — FENTANYL CITRATE (PF) 100 MCG/2ML IJ SOLN
INTRAMUSCULAR | Status: AC
  Filled 2016-05-25: qty 2

## 2016-05-25 MED ORDER — SPIRONOLACTONE 25 MG PO TABS
12.5000 mg | ORAL_TABLET | Freq: Once | ORAL | Status: AC
  Administered 2016-05-25: 12.5 mg via ORAL
  Filled 2016-05-25: qty 1

## 2016-05-25 MED ORDER — LIDOCAINE HCL (PF) 1 % IJ SOLN
INTRAMUSCULAR | Status: DC | PRN
  Administered 2016-05-25: 15 mL via INTRADERMAL

## 2016-05-25 MED ORDER — SODIUM CHLORIDE 0.9 % IV SOLN: INTRAVENOUS | Status: DC

## 2016-05-25 MED ORDER — MIDAZOLAM HCL 2 MG/2ML IJ SOLN
INTRAMUSCULAR | Status: DC | PRN
  Administered 2016-05-25: 1 mg via INTRAVENOUS

## 2016-05-25 MED ORDER — SPIRONOLACTONE 25 MG PO TABS
25.0000 mg | ORAL_TABLET | Freq: Every day | ORAL | Status: DC
  Administered 2016-05-26 – 2016-05-29 (×4): 25 mg via ORAL
  Filled 2016-05-25 (×3): qty 1

## 2016-05-25 MED ORDER — HEPARIN (PORCINE) IN NACL 2-0.9 UNIT/ML-% IJ SOLN
INTRAMUSCULAR | Status: AC | PRN
  Administered 2016-05-25: 500 mL

## 2016-05-25 MED ORDER — HEPARIN (PORCINE) IN NACL 2-0.9 UNIT/ML-% IJ SOLN
INTRAMUSCULAR | Status: AC
  Filled 2016-05-25: qty 500

## 2016-05-25 MED ORDER — SODIUM CHLORIDE 0.9 % IV SOLN: 250.0000 mL | INTRAVENOUS | Status: DC | PRN

## 2016-05-25 SURGICAL SUPPLY — 7 items
CATH SWAN GANZ 7F STRAIGHT (CATHETERS) ×1 IMPLANT
COVER PRB 48X5XTLSCP FOLD TPE (BAG) IMPLANT
COVER PROBE 5X48 (BAG) ×2
PACK CARDIAC CATHETERIZATION (CUSTOM PROCEDURE TRAY) ×2 IMPLANT
SHEATH PINNACLE 7F 10CM (SHEATH) ×1 IMPLANT
SLEEVE REPOSITIONING LENGTH 30 (MISCELLANEOUS) ×1 IMPLANT
TRANSDUCER W/STOPCOCK (MISCELLANEOUS) ×2 IMPLANT

## 2016-05-25 NOTE — Progress Notes (Signed)
[facility]  Patient Status: Active (receiving services up to time of hospitalization)  [facility] is providing the following services: RN  If patient discharges after hours, please call [phone].   Kizzie FurnishDonna Fellmy 05/25/2016, 11:24 AM

## 2016-05-25 NOTE — Progress Notes (Signed)
Advanced Heart Failure Rounding Note  Primary Cardiologist: Dr. Shirlee Latch   Subjective:    Admitted from home 05/24/16 with worsening SOB and volume overload.   Remains very fatigued. No SOB at rest with diuresis. Appears pale.  SOB with any exertion. Due for Pleurx drain today. Past 3 times has had > 1000 cc out.   Negative 1.3 L and down 2 lbs.   Coox 63.9% this am on milrinone 0.25 mcg/kg/min.   Objective:   Weight Range: 207 lb 8 oz (94.1 kg) Body mass index is 28.14 kg/m.   Vital Signs:   Temp:  [98.1 F (36.7 C)-99.6 F (37.6 C)] 98.3 F (36.8 C) (05/24 0300) Pulse Rate:  [87-94] 87 (05/24 0000) Resp:  [18-22] 22 (05/24 0000) BP: (102-121)/(63-82) 116/74 (05/24 0000) SpO2:  [96 %-100 %] 96 % (05/24 0000) Weight:  [207 lb 8 oz (94.1 kg)-209 lb 14.1 oz (95.2 kg)] 207 lb 8 oz (94.1 kg) (05/24 0500)    Weight change: Filed Weights   05/24/16 1800 05/25/16 0500  Weight: 209 lb 14.1 oz (95.2 kg) 207 lb 8 oz (94.1 kg)    Intake/Output:   Intake/Output Summary (Last 24 hours) at 05/25/16 0748 Last data filed at 05/25/16 0400  Gross per 24 hour  Intake             66.6 ml  Output             1425 ml  Net          -1358.4 ml     Physical Exam: General:  Chronically ill and fatigued appearing. NAD at rest.  HEENT: Normal Neck: supple. JVP 9-10 cm. Carotids 2+ bilat; no bruits. No lymphadenopathy or thyromegaly appreciated. Cor: PMI nondisplaced. Regular rate & rhythm. 1/6 early SEM RUSB.  Lungs: Diminished throughout.  Abdomen: soft, nontender, nondistended. No hepatosplenomegaly. No bruits or masses. Good bowel sounds. Extremities: no cyanosis, clubbing, or rash. Trace ankle edema. Cool. Neuro: alert & orientedx3, cranial nerves grossly intact. moves all 4 extremities w/o difficulty. Affect pleasant  Telemetry: Personally reviewed, NSR 70s with PVCs  Labs: CBC  Recent Labs  05/25/16 0435  WBC 7.6  NEUTROABS 5.5  HGB 11.6*  HCT 35.8*  MCV 89.1  PLT  165   Basic Metabolic Panel  Recent Labs  05/24/16 1120 05/25/16 0435  NA 135 135  K 3.2* 2.9*  CL 96* 97*  CO2 31 30  GLUCOSE 138* 181*  BUN 9 8  CREATININE 0.89 0.69  CALCIUM 8.8* 8.1*   Liver Function Tests  Recent Labs  05/25/16 0435  AST 16  ALT 16*  ALKPHOS 95  BILITOT 0.5  PROT 6.2*  ALBUMIN 2.6*   No results for input(s): LIPASE, AMYLASE in the last 72 hours. Cardiac Enzymes No results for input(s): CKTOTAL, CKMB, CKMBINDEX, TROPONINI in the last 72 hours.  BNP: BNP (last 3 results)  Recent Labs  04/05/16 1221 04/21/16 1208 05/24/16 1120  BNP 444.6* 356.2* 374.5*    ProBNP (last 3 results) No results for input(s): PROBNP in the last 8760 hours.   D-Dimer No results for input(s): DDIMER in the last 72 hours. Hemoglobin A1C No results for input(s): HGBA1C in the last 72 hours. Fasting Lipid Panel No results for input(s): CHOL, HDL, LDLCALC, TRIG, CHOLHDL, LDLDIRECT in the last 72 hours. Thyroid Function Tests No results for input(s): TSH, T4TOTAL, T3FREE, THYROIDAB in the last 72 hours.  Invalid input(s): FREET3  Other results:     Imaging/Studies:  Dg Chest 2 View  Result Date: 05/25/2016 CLINICAL DATA:  CHF. EXAM: CHEST  2 VIEW COMPARISON:  CT 05/11/2016. Chest x-ray 04/09/2016, 03/24/2016, 01/17/2016. FINDINGS: Right PICC line noted in stable position. PleurX catheter in stable position. Cardiac pacer with lead tip over the right ventricle. Cardiomegaly with diffuse bilateral pulmonary interstitial prominence and right-sided pleural effusion consistent with CHF. Right pleural effusion slightly smaller on today's exam. Left costophrenic angle not imaged. Slightly prominent for tumor is noted arising from the right posterior third rib, unchanged from multiple prior exams. No definite pneumothorax. No pneumothorax noted on recent CT of 05/11/2016. IMPRESSION: 1.  Right PICC line and right PleurX catheter in stable position. 2. Cardiac pacer in  stable position. Cardiomegaly with pulmonary interstitial prominence and right-sided pleural effusion consistent with CHF. Right pleural effusion is slightly smaller on today's exam. Electronically Signed   By: Maisie Fushomas  Register   On: 05/25/2016 06:53       Medications:     Scheduled Medications: . aspirin EC  81 mg Oral Daily  . atorvastatin  80 mg Oral Q2000  . chlorhexidine  10 mL Mouth/Throat BID  . Chlorhexidine Gluconate Cloth  6 each Topical Daily  . digoxin  0.125 mg Oral Daily  . ezetimibe  10 mg Oral Daily  . furosemide  80 mg Intravenous BID  . insulin aspart  0-15 Units Subcutaneous TID WC  . insulin aspart  0-5 Units Subcutaneous QHS  . potassium chloride SA  40 mEq Oral BID  . potassium chloride  40 mEq Oral Once  . sildenafil  20 mg Oral TID  . sodium chloride flush  3 mL Intravenous Q12H  . sodium chloride flush  3 mL Intravenous Q12H  . spironolactone  12.5 mg Oral Daily     Infusions: . sodium chloride    . sodium chloride    . [START ON 05/26/2016] sodium chloride    . milrinone 0.25 mcg/kg/min (05/25/16 0400)     PRN Medications:  sodium chloride, sodium chloride, acetaminophen, ondansetron (ZOFRAN) IV, sodium chloride flush, sodium chloride flush, sodium chloride flush   Assessment/Plan   1. Acute on chronic systolic CHF due to ICM s/p St Jude ICD - Echo 03/2016 with EF 20-25%. Minimal improvement s/p PCI in 12/17 - Volume status remains elevated. Continue IV lasix 80 mg BID for now - Continue milrinone 0.25 mcg/kg/min.  - Continue digoxin 0.125 mg daily.  - Plan for RHC with swan this afternoon - Tentative HVAD placement next week pending out-of-network exception approval.  Pt is unfit to fly or be transported.  2. CAD - 3 vessel CAD s/p stent to proximal LAD and PTCA to subtotally occluded large OM1 in 1/18.  - No change in symptoms or EF with intervention - Plavix on hold. Hope to place HVAD next week - Has been > 3 months since PCI -  Continue ASA and statin 3. R Pleural effusion - Chronic, transudate - Pleurx in place. Continues to drain. Leave in through surgery. 4. Pulmonary HTN - Continue revatio 20 mg TID.  - RHC today 5. Smoking - Strongly encouraged cessation.  6. Hypokalemia - Supp increased, will receive total of 120 meq today.   Plan for RHC today with swan to optimize hemodynamics. May need IABP.   Length of Stay: 1  Dalton SchoolMichael Andrew Tillery, PA-C  05/25/2016, 7:48 AM  Advanced Heart Failure Team Pager [phone] (M-F; 7a - 4p)  Please contact [facility] for night-coverage after hours (4p -7a ) and  weekends on [email or web address]  Patient seen with PA, agree with the above note.   Co-ox 63% this morning.  Starting to feel better with diuresis but still "weak."  Plan RHC today, will leave swan in place.    Continue IV Lasix for now, will get better idea of hemodynamics on cath.    Drain PleurX regularly.   Seen by Dr. Donata Clay, tentative plan for LVAD on Tuesday after Plavix wash-out.   [name] 05/25/2016 9:58 AM

## 2016-05-25 NOTE — H&P (View-Only) (Signed)
Advanced Heart Failure Rounding Note  Primary Cardiologist: Dr. Shirlee Latch   Subjective:    Admitted from home 05/24/16 with worsening SOB and volume overload.   Remains very fatigued. No SOB at rest with diuresis. Appears pale.  SOB with any exertion. Due for Pleurx drain today. Past 3 times has had > 1000 cc out.   Negative 1.3 L and down 2 lbs.   Coox 63.9% this am on milrinone 0.25 mcg/kg/min.   Objective:   Weight Range: 207 lb 8 oz (94.1 kg) Body mass index is 28.14 kg/m.   Vital Signs:   Temp:  [98.1 F (36.7 C)-99.6 F (37.6 C)] 98.3 F (36.8 C) (05/24 0300) Pulse Rate:  [87-94] 87 (05/24 0000) Resp:  [18-22] 22 (05/24 0000) BP: (102-121)/(63-82) 116/74 (05/24 0000) SpO2:  [96 %-100 %] 96 % (05/24 0000) Weight:  [207 lb 8 oz (94.1 kg)-209 lb 14.1 oz (95.2 kg)] 207 lb 8 oz (94.1 kg) (05/24 0500)    Weight change: Filed Weights   05/24/16 1800 05/25/16 0500  Weight: 209 lb 14.1 oz (95.2 kg) 207 lb 8 oz (94.1 kg)    Intake/Output:   Intake/Output Summary (Last 24 hours) at 05/25/16 0748 Last data filed at 05/25/16 0400  Gross per 24 hour  Intake             66.6 ml  Output             1425 ml  Net          -1358.4 ml     Physical Exam: General:  Chronically ill and fatigued appearing. NAD at rest.  HEENT: Normal Neck: supple. JVP 9-10 cm. Carotids 2+ bilat; no bruits. No lymphadenopathy or thyromegaly appreciated. Cor: PMI nondisplaced. Regular rate & rhythm. 1/6 early SEM RUSB.  Lungs: Diminished throughout.  Abdomen: soft, nontender, nondistended. No hepatosplenomegaly. No bruits or masses. Good bowel sounds. Extremities: no cyanosis, clubbing, or rash. Trace ankle edema. Cool. Neuro: alert & orientedx3, cranial nerves grossly intact. moves all 4 extremities w/o difficulty. Affect pleasant  Telemetry: Personally reviewed, NSR 70s with PVCs  Labs: CBC  Recent Labs  05/25/16 0435  WBC 7.6  NEUTROABS 5.5  HGB 11.6*  HCT 35.8*  MCV 89.1  PLT  165   Basic Metabolic Panel  Recent Labs  05/24/16 1120 05/25/16 0435  NA 135 135  K 3.2* 2.9*  CL 96* 97*  CO2 31 30  GLUCOSE 138* 181*  BUN 9 8  CREATININE 0.89 0.69  CALCIUM 8.8* 8.1*   Liver Function Tests  Recent Labs  05/25/16 0435  AST 16  ALT 16*  ALKPHOS 95  BILITOT 0.5  PROT 6.2*  ALBUMIN 2.6*   No results for input(s): LIPASE, AMYLASE in the last 72 hours. Cardiac Enzymes No results for input(s): CKTOTAL, CKMB, CKMBINDEX, TROPONINI in the last 72 hours.  BNP: BNP (last 3 results)  Recent Labs  04/05/16 1221 04/21/16 1208 05/24/16 1120  BNP 444.6* 356.2* 374.5*    ProBNP (last 3 results) No results for input(s): PROBNP in the last 8760 hours.   D-Dimer No results for input(s): DDIMER in the last 72 hours. Hemoglobin A1C No results for input(s): HGBA1C in the last 72 hours. Fasting Lipid Panel No results for input(s): CHOL, HDL, LDLCALC, TRIG, CHOLHDL, LDLDIRECT in the last 72 hours. Thyroid Function Tests No results for input(s): TSH, T4TOTAL, T3FREE, THYROIDAB in the last 72 hours.  Invalid input(s): FREET3  Other results:     Imaging/Studies:  Dg Chest 2 View  Result Date: 05/25/2016 CLINICAL DATA:  CHF. EXAM: CHEST  2 VIEW COMPARISON:  CT 05/11/2016. Chest x-ray 04/09/2016, 03/24/2016, 01/17/2016. FINDINGS: Right PICC line noted in stable position. PleurX catheter in stable position. Cardiac pacer with lead tip over the right ventricle. Cardiomegaly with diffuse bilateral pulmonary interstitial prominence and right-sided pleural effusion consistent with CHF. Right pleural effusion slightly smaller on today's exam. Left costophrenic angle not imaged. Slightly prominent for tumor is noted arising from the right posterior third rib, unchanged from multiple prior exams. No definite pneumothorax. No pneumothorax noted on recent CT of 05/11/2016. IMPRESSION: 1.  Right PICC line and right PleurX catheter in stable position. 2. Cardiac pacer in  stable position. Cardiomegaly with pulmonary interstitial prominence and right-sided pleural effusion consistent with CHF. Right pleural effusion is slightly smaller on today's exam. Electronically Signed   By: Maisie Fushomas  Register   On: 05/25/2016 06:53       Medications:     Scheduled Medications: . aspirin EC  81 mg Oral Daily  . atorvastatin  80 mg Oral Q2000  . chlorhexidine  10 mL Mouth/Throat BID  . Chlorhexidine Gluconate Cloth  6 each Topical Daily  . digoxin  0.125 mg Oral Daily  . ezetimibe  10 mg Oral Daily  . furosemide  80 mg Intravenous BID  . insulin aspart  0-15 Units Subcutaneous TID WC  . insulin aspart  0-5 Units Subcutaneous QHS  . potassium chloride SA  40 mEq Oral BID  . potassium chloride  40 mEq Oral Once  . sildenafil  20 mg Oral TID  . sodium chloride flush  3 mL Intravenous Q12H  . sodium chloride flush  3 mL Intravenous Q12H  . spironolactone  12.5 mg Oral Daily     Infusions: . sodium chloride    . sodium chloride    . [START ON 05/26/2016] sodium chloride    . milrinone 0.25 mcg/kg/min (05/25/16 0400)     PRN Medications:  sodium chloride, sodium chloride, acetaminophen, ondansetron (ZOFRAN) IV, sodium chloride flush, sodium chloride flush, sodium chloride flush   Assessment/Plan   1. Acute on chronic systolic CHF due to ICM s/p St Jude ICD - Echo 03/2016 with EF 20-25%. Minimal improvement s/p PCI in 12/17 - Volume status remains elevated. Continue IV lasix 80 mg BID for now - Continue milrinone 0.25 mcg/kg/min.  - Continue digoxin 0.125 mg daily.  - Plan for RHC with swan this afternoon - Tentative HVAD placement next week pending out-of-network exception approval.  Pt is unfit to fly or be transported.  2. CAD - 3 vessel CAD s/p stent to proximal LAD and PTCA to subtotally occluded large OM1 in 1/18.  - No change in symptoms or EF with intervention - Plavix on hold. Hope to place HVAD next week - Has been > 3 months since PCI -  Continue ASA and statin 3. R Pleural effusion - Chronic, transudate - Pleurx in place. Continues to drain. Leave in through surgery. 4. Pulmonary HTN - Continue revatio 20 mg TID.  - RHC today 5. Smoking - Strongly encouraged cessation.  6. Hypokalemia - Supp increased, will receive total of 120 meq today.   Plan for RHC today with swan to optimize hemodynamics. May need IABP.   Length of Stay: 1  Luane SchoolMichael Andrew Tillery, PA-C  05/25/2016, 7:48 AM  Advanced Heart Failure Team Pager [phone] (M-F; 7a - 4p)  Please contact [facility] for night-coverage after hours (4p -7a ) and  weekends on [email or web address]  Patient seen with PA, agree with the above note.   Co-ox 63% this morning.  Starting to feel better with diuresis but still "weak."  Plan RHC today, will leave swan in place.    Continue IV Lasix for now, will get better idea of hemodynamics on cath.    Drain PleurX regularly.   Seen by Dr. Donata Clay, tentative plan for LVAD on Tuesday after Plavix wash-out.   Marca Ancona 05/25/2016 9:58 AM

## 2016-05-25 NOTE — Care Management Note (Signed)
Case Management Note Donn PieriniKristi Ayaan Shutes RN, BSN Unit 2W-Case Manager-- 2H coverage [phone]  Patient Details  Name: Dalton RainwaterLarry D [name] MRN: [id number] Date of Birth: [birth year]  Subjective/Objective:   Pt admitted with acute on chronic HF- plan was for LVAD placement- however pt's insurance denied having LVAD placed here- team working on that - tentative plan for LVAD next Tuesday 5/29 after Plavix wash out                Action/Plan:  PTA pt lived at home his father assist as caregiver- [facility]- pt is on home IV milrinone- also has PleurX cath. - CM will follow for d/c needs pending on LVAD outcome-   Expected Discharge Date:                  Expected Discharge Plan:  Home w Home Health Services  In-House Referral:     Discharge planning Services  CM Consult  Post Acute Care Choice:  Home Health, Resumption of Svcs/PTA Provider Choice offered to:  Patient  DME Arranged:    DME Agency:     HH Arranged:  RN, Disease Management HH Agency:  [facility]  Status of Service:  In process, will continue to follow  If discussed at Long Length of Stay Meetings, dates discussed:    Discharge Disposition:   Additional Comments:  Darrold SpanWebster, Zyan Coby Hall, RN 05/25/2016, 11:56 AM

## 2016-05-25 NOTE — Progress Notes (Unsigned)
Network exception Artistapproval letter for VAD implant received via fax from [employer] [name] at [facility]. Copies of letter placed in patient's electronic medical record and also with [name], MD Medical Director of the Advanced [facility] and [facility]cul[facility], and CHF SW [employer]ie Brennan's desk.  [name] (clinical support nurse) contact info: Phone: [phone]  Mathews RobinsonsBrandy Olsen (Benefits Manager) contact info: Phone: [phone] [email or web address]

## 2016-05-25 NOTE — Progress Notes (Signed)
Medical Records faxed to Attn: Baird Kayana Nowlin with [facility] for appeals process on behalf of patient for LVAD implant to provided fax # [phone] ([phone] pages total) Skipper Clicheana made aware of incoming fax via phone call with CHF SW Lasandra BeechJackie Brennan.  [name], [name], RN

## 2016-05-25 NOTE — Interval H&P Note (Signed)
History and Physical Interval Note:  05/25/2016 2:18 PM  [name]  has presented today for surgery, with the diagnosis of chf  The various methods of treatment have been discussed with the patient and family. After consideration of risks, benefits and other options for treatment, the patient has consented to  Procedure(s): Right Heart Cath (N/A) as a surgical intervention .  The patient's history has been reviewed, patient examined, no change in status, stable for surgery.  I have reviewed the patient's chart and labs.  Questions were answered to the patient's satisfaction.     Lisbeth Puller [employer]

## 2016-05-25 NOTE — Progress Notes (Signed)
CSW met with patient at bedside to discuss further the network exception process. [facility] contacted CSW requesting patient sign release of information for records to be sent to [facility] from this current hospitalization. Patient very tired and weak in bed. Patient states he is feeling worse and agreeable to sign release in hopes for an answer today about the exception. Patient's father at bedside and shared frustrations about the process and appreciation to LVAD team here at [place] informed patient that we will continue to pursue exception and as soon as we are notified of answer he will be informed. Patient grateful to CSW. CSW continues to follow for support and hopeful for VAD implant approval for Tuesday May 30, 2016 at [place], [place], [place]

## 2016-05-25 NOTE — Progress Notes (Signed)
[facility] Mr. [name] is an active pt with [facility] prior to this readmission.  [facility] is providing a HHRN and [facility] services for home Inotropes.  [facility] while an [facility] to support home care needs as ordered upon DC.  If patient discharges after hours, please call [phone].   [name] 05/25/2016, 12:41 PM

## 2016-05-26 ENCOUNTER — Encounter (HOSPITAL_COMMUNITY): Payer: BLUE CROSS/BLUE SHIELD

## 2016-05-26 ENCOUNTER — Encounter (HOSPITAL_COMMUNITY): Payer: Self-pay | Admitting: Cardiology

## 2016-05-26 ENCOUNTER — Inpatient Hospital Stay (HOSPITAL_COMMUNITY): Payer: BLUE CROSS/BLUE SHIELD

## 2016-05-26 DIAGNOSIS — I5023 Acute on chronic systolic (congestive) heart failure: Secondary | ICD-10-CM

## 2016-05-26 DIAGNOSIS — Z01810 Encounter for preprocedural cardiovascular examination: Secondary | ICD-10-CM

## 2016-05-26 DIAGNOSIS — I340 Nonrheumatic mitral (valve) insufficiency: Secondary | ICD-10-CM

## 2016-05-26 LAB — CBC
HCT: 34.7 % — ABNORMAL LOW (ref 39.0–52.0)
Hemoglobin: 11.2 g/dL — ABNORMAL LOW (ref 13.0–17.0)
MCH: 28.8 pg (ref 26.0–34.0)
MCHC: 32.3 g/dL (ref 30.0–36.0)
MCV: 89.2 fL (ref 78.0–100.0)
PLATELETS: 152 10*3/uL (ref 150–400)
RBC: 3.89 MIL/uL — AB (ref 4.22–5.81)
RDW: 16.9 % — AB (ref 11.5–15.5)
WBC: 7.2 10*3/uL (ref 4.0–10.5)

## 2016-05-26 LAB — GLUCOSE, CAPILLARY
GLUCOSE-CAPILLARY: 226 mg/dL — AB (ref 65–99)
GLUCOSE-CAPILLARY: 330 mg/dL — AB (ref 65–99)
GLUCOSE-CAPILLARY: 245 mg/dL — AB (ref 65–99)
Glucose-Capillary: 251 mg/dL — ABNORMAL HIGH (ref 65–99)

## 2016-05-26 LAB — HEMOGLOBIN A1C
Hgb A1c MFr Bld: 7.6 % — ABNORMAL HIGH (ref 4.8–5.6)
Mean Plasma Glucose: 171 mg/dL

## 2016-05-26 LAB — BASIC METABOLIC PANEL
ANION GAP: 5 (ref 5–15)
BUN: 8 mg/dL (ref 6–20)
CO2: 29 mmol/L (ref 22–32)
Calcium: 8.3 mg/dL — ABNORMAL LOW (ref 8.9–10.3)
Chloride: 100 mmol/L — ABNORMAL LOW (ref 101–111)
Creatinine, Ser: 0.69 mg/dL (ref 0.61–1.24)
GFR calc Af Amer: >60 mL/min (ref >60)
Glucose, Bld: 257 mg/dL — ABNORMAL HIGH (ref 65–99)
POTASSIUM: 4 mmol/L (ref 3.5–5.1)
SODIUM: 134 mmol/L — AB (ref 135–145)

## 2016-05-26 LAB — COOXEMETRY PANEL
CARBOXYHEMOGLOBIN: 2.5 % — AB (ref 0.5–1.5)
METHEMOGLOBIN: 0.6 % (ref 0.0–1.5)
O2 SAT: 63.9 %
TOTAL HEMOGLOBIN: 11.7 g/dL — AB (ref 12.0–16.0)

## 2016-05-26 LAB — ECHOCARDIOGRAM COMPLETE
Height: 72 in
Weight: 3343.94 oz

## 2016-05-26 MED ORDER — INSULIN GLARGINE 100 UNIT/ML ~~LOC~~ SOLN
15.0000 [IU] | Freq: Two times a day (BID) | SUBCUTANEOUS | Status: DC
  Administered 2016-05-26 – 2016-05-28 (×5): 15 [IU] via SUBCUTANEOUS
  Filled 2016-05-26 (×5): qty 0.15

## 2016-05-26 MED ORDER — ENOXAPARIN SODIUM 40 MG/0.4ML ~~LOC~~ SOLN
40.0000 mg | SUBCUTANEOUS | Status: DC
  Administered 2016-05-26 – 2016-05-29 (×4): 40 mg via SUBCUTANEOUS
  Filled 2016-05-26 (×4): qty 0.4

## 2016-05-26 NOTE — Progress Notes (Signed)
Nutrition Brief Note  Patient identified on the Malnutrition Screening Tool (MST) Report.  Per readings below, pt's weight fluctuates; more than likely due to CHF/Lasix.  Wt Readings from Last 15 Encounters:  05/26/16 208 lb 15.9 oz (94.8 kg)  05/24/16 211 lb (95.7 kg)  05/17/16 217 lb (98.4 kg)  05/10/16 206 lb (93.4 kg)  05/04/16 200 lb (90.7 kg)  05/01/16 201 lb (91.2 kg)  04/21/16 203 lb (92.1 kg)  04/21/16 203 lb 12.8 oz (92.4 kg)  04/10/16 211 lb 9.6 oz (96 kg)  05/05/16 218 lb 11.1 oz (99.2 kg)  03/20/16 218 lb (98.9 kg)  03/10/16 220 lb 12.8 oz (100.2 kg)  03/07/16 224 lb 6.9 oz (101.8 kg)  02/08/16 208 lb 12.8 oz (94.7 kg)  02/03/16 214 lb 11.2 oz (97.4 kg)   Body mass index is 28.34 kg/m. Patient meets criteria for Overweight based on current BMI.   Current diet order is Dys 3-thin liquid, patient is consuming approximately 100% of meals at this time.   Glucerna Shake supplement ordered TID.  Labs and medications reviewed.   No nutrition interventions warranted at this time. If nutrition issues arise, please consult RD.   [name] ChattersKatie Sallyann Kinnaird, RD, LDN Pager #: [phone] After-Hours Pager #: [phone]

## 2016-05-26 NOTE — Progress Notes (Signed)
  Echocardiogram 2D Echocardiogram has been performed.  Roniqua Kintz T Randie Bloodgood 05/26/2016, 3:13 PM

## 2016-05-26 NOTE — Plan of Care (Signed)
Pleurex catheter drained by pt's father....1050 cc fluid removed..site crusty with sutures....removed and dressed by Dr. Maren BeachVanTrigt

## 2016-05-26 NOTE — Progress Notes (Signed)
[name] has been discussed with the VAD Medical Review board on 04/17/16. The team feels as if the patient is a good candidate for Destination LVAD therapy. The patient meets criteria for a LVAD implant as listed below:  1)  Has failed to respond to optimal medical management (including beta-blockers and ACE inhibitors if tolerated) for at least 45 of the last 60 days, or have been balloon dependent for 7 days, or IV inotrope dependent for 14 days; and,       *On Inotropes Milrinone 0.188mg started 04/07/16.  2)  Has a left ventricular ejection fraction (LVEF) < 25% and, have demonstrated functional limitation with a peak oxygen consumption of <14 ml/kg/min unless balloon pump or inotrope dependent or physically unable to perform the test.         *EF 25-30%  By echo (date)  03/24/16            *CPX (date)  pVO2 ____ RER_____ VE/VCo2 slope_____ not done due to pt being on Milrinone  3)  Social work and palliative care evaluations demonstrate appropriate support system in place for discharge to home with a VAD and that end of life discussions have taken place. Both services have expressed no concern regarding patient's candidacy.         *Social work consult (date): 04/21/16          4)  Primary caretaker identified that can be taught along with the patient how to manage        the VAD equipment.        *Name: DCarter Kassel 5)  Deemed appropriate by our financial coordinator: yes        Prior approval: 05/25/16  6)  VAD Coordinator, SJudson Rochhas met with patient and caregiver, shown them the VAD equipment and discussed with the patient and caregiver about lifestyle changes necessary for success on mechanical circulatory device.        *Met with LFritz Pickerel on 05/26/16.       *Consent for VAD Evaluation/Caregiver Agreement/HIPPA Release/Photo Release signed on 05/17/16  7)  [facility] and felt to be an appropriate candidate by Dr. MAundra Dubin    8)  Intermacs  profile: 2  INTERMACS 1: Critical cardiogenic shock describes a patient who is "crashing and burning", in which a patient has life-threatening hypotension and rapidly escalating inotropic pressor support, with critical organ hypoperfusion often confirmed by worsening acidosis and lactate levels.  INTERMACS 2: Progressive decline describes a patient who has been demonstrated "dependent" on inotropic support but nonetheless shows signs of continuing deterioration in nutrition, renal function, fluid retention, or other major status indicator. Patient profile 2 can also describe a patient with refractory volume overload, perhaps with evidence of impaired perfusion, in whom inotropic infusions cannot be maintained due to tachyarrhythmias, clinical ischemia, or other intolerance.  INTERMACS 3: Stable but inotrope dependent describes a patient who is clinically stable on mild-moderate doses of intravenous inotropes (or has a temporary circulatory support device) after repeated documentation of failure to wean without symptomatic hypotension, worsening symptoms, or progressive organ dysfunction (usually renal). It is critical to monitor nutrition, renal function, fluid balance, and overall status carefully in order to distinguish between a  patient who is truly stable at Patient Profile 3 and a patient who has unappreciated decline rendering them Patient Profile 2. This patient may be either at home or in the hospital.   INTERMACS 4: Resting symptoms describes a patient who is  at home on oral therapy but frequently has symptoms of congestion at rest or with activities of daily living (ADL). He or she may have orthopnea, shortness of breath during ADL such as dressing or bathing, gastrointestinal symptoms (abdominal discomfort, nausea, poor appetite), disabling ascites or severe lower extremity edema. This patient should be carefully considered for more intensive management and surveillance programs, which  may in some cases, reveal poor compliance that would compromise outcomes with any therapy.  .  INTERMACS 5: Exertion Intolerant describes a patient who is comfortable at rest but unable to engage in any activity, living predominantly within the house or housebound. This patient has no congestive symptoms, but may have chronically elevated volume status, frequently with renal dysfunction, and may be characterized as exercise intolerant.   INTERMACS 6: Exertion Limited also describes a patient who is comfortable at rest without evidence of fluid overload, but who is able to do some mild activity. Activities of daily living are comfortable and minor activities outside the home such as visiting friends or going to a restaurant can be performed, but fatigue results within a few minutes of any meaningful physical exertion. This patient has occasional episodes of worsening symptoms and is likely to have had a hospitalization for heart failure within the past year.   INTERMACS 7: Advanced NYHA Class 3 describes a patient who is clinically stable with a reasonable level of comfortable activity, despite history of previous decompensation that is not recent. This patient is usually able to walk more than a block. Any decompensation requiring intravenous diuretics or hospitalization within the previous month should make this person a Patient Profile 6 or lower.    9)  NYHA Class: IV

## 2016-05-26 NOTE — Progress Notes (Signed)
*  PRELIMINARY RESULTS* Vascular Ultrasound Lower extremity venous duplex has been completed.  Preliminary findings: No evidence of DVT or baker's cyst.   Farrel DemarkJill Eunice, RDMS, RVT  05/26/2016, 1:31 PM

## 2016-05-26 NOTE — Progress Notes (Signed)
1 Day Post-Op Procedure(s) (LRB): Right Heart Cath (N/A) Subjective:  Acute on chronic systolic heart failure from ischemic cardiomyopathy Patient with clinical deterioration despite IV milrinone through PICC line Recurrent right pleural effusion from heart failure managed with Pleurx catheter  Echocardiogram performed today personally reviewed showing adequate RV function. There is mild-moderate TR probably related to the AICD leads. We will assess more carefully with TEE at surgery but with good RV function would not surgically approach the tricuspid valve for regurgitation related to AICD leads.  Patient's duplex venous scan shows no DVT  Patient has evidence of a lobular liver on his recent abdominal CT scan however blood ammonia level is normal, LFTs are normal  Patient needs more diuresis prior to surgery Would remove PA catheter probably Saturday to allow patient to ambulate Sunday Monday to buildup strength prior to surgery  Patient has been on Plavix with extreme suppression of platelet function-we'll check P2 Y 12 level Saturday and Monday  Objective: Vital signs in last 24 hours: Temp:  [96.6 F (35.9 C)-98.6 F (37 C)] 97.7 F (36.5 C) (05/25 1700) Pulse Rate:  [70-90] 83 (05/25 1700) Cardiac Rhythm: Normal sinus rhythm;Bundle branch block (05/25 0752) Resp:  [10-25] 20 (05/25 1700) BP: (95-124)/(51-79) 115/64 (05/25 1700) SpO2:  [90 %-100 %] 97 % (05/25 1700) Weight:  [208 lb 15.9 oz (94.8 kg)] 208 lb 15.9 oz (94.8 kg) (05/25 0500)  Hemodynamic parameters for last 24 hours: PAP: (42-69)/(21-32) 56/25 CVP:  [4 mmHg-16 mmHg] 6 mmHg PCWP:  [27 mmHg] 27 mmHg CO:  [4.6 L/min-5.3 L/min] 5.3 L/min CI:  [2.1 L/min/m2-2.5 L/min/m2] 2.5 L/min/m2  Intake/Output from previous day: 05/24 0701 - 05/25 0700 In: 913.1 [P.O.:540; I.V.:373.1] Out: 850 [Urine:600] Intake/Output this shift: Total I/O In: 1048 [P.O.:780; I.V.:268] Out: 1352 [Urine:301; Other:1050; Stool:1]  I  personally examined the patient's right Pleurx catheter site. There is some eschar at the suture site and  the suture is removed. The site was cleaned with Betadine and a Xeroform dressing was applied. No. Purulence. The catheter drained almost 1.1 L today. We'll follow the condition of the catheter but may pull it out prior to surgery depending on how the site looks   Lab Results:  Recent Labs  05/25/16 0435 05/26/16 0421  WBC 7.6 7.2  HGB 11.6* 11.2*  HCT 35.8* 34.7*  PLT 165 152   BMET:  Recent Labs  05/25/16 0435 05/26/16 0421  NA 135 134*  K 2.9* 4.0  CL 97* 100*  CO2 30 29  GLUCOSE 181* 257*  BUN 8 8  CREATININE 0.69 0.69  CALCIUM 8.1* 8.3*    PT/INR:  Recent Labs  05/25/16 0750  LABPROT 14.7  INR 1.15   ABG    Component Value Date/Time   PHART 7.460 (H) 01/28/2016 0854   HCO3 30.1 (H) 05/25/2016 1447   HCO3 30.1 (H) 05/25/2016 1447   TCO2 32 05/25/2016 1447   TCO2 32 05/25/2016 1447   O2SAT 63.9 05/26/2016 0435   CBG (last 3)   Recent Labs  05/26/16 0759 05/26/16 1140 05/26/16 1606  GLUCAP 226* 330* 251*    Assessment/Plan: S/P Procedure(s) (LRB): Right Heart Cath (N/A) Plan VAD implantation on Tuesday, May 29 with HVAD for destination therapy. The patient understands he is not currently a heart transplant candidate and is not on a heart transplant waiting list.   LOS: 2 days    Kathlee Nationseter Van Trigt III 05/26/2016

## 2016-05-26 NOTE — Progress Notes (Signed)
Advanced Heart Failure Rounding Note  Primary Cardiologist: Dr. Shirlee LatchMcLean   Subjective:    Admitted from home 05/24/16 with worsening SOB and volume overload.   RHC yesterday with mildly elevated PCWP, Mild to moderate PH, primarily venous, and adequate CO on milrinone.   Feeling tired and run down. No SOB at rest but + with mild exertion. Sitting up in chair.   I/O positive 60 cc yesterday. UO 600 cc. Weight up 1 lb.   Coox stable at 63.9% on milrinone 0.25 mcg/kg/min.   Swan numbers this am PAP 57/24 (36) CVP 8-9 CI 2.1  RHC 05/25/16 RA mean 2 RV 50/6 PA 52/19, mean 33 PCWP mean 19 Oxygen saturations: PA 71% AO 100% Cardiac Output (Fick) 6.31 Cardiac Index (Fick) 2.91 PVR 2.2 WU Cardiac Output (Thermo) 4.86 Cardiac Index (Thermo) 2.24 PVR 2.9 WU  Objective:   Weight Range: 208 lb 15.9 oz (94.8 kg) Body mass index is 28.34 kg/m.   Vital Signs:   Temp:  [96.6 F (35.9 C)-99.9 F (37.7 C)] 97.7 F (36.5 C) (05/25 0800) Pulse Rate:  [0-178] 73 (05/25 0800) Resp:  [0-24] 13 (05/25 0800) BP: (95-128)/(51-79) 102/63 (05/25 0800) SpO2:  [0 %-100 %] 99 % (05/25 0800) Weight:  [208 lb 15.9 oz (94.8 kg)] 208 lb 15.9 oz (94.8 kg) (05/25 0500) Last BM Date: 05/26/16  Weight change: Filed Weights   05/24/16 1800 05/25/16 0500 05/26/16 0500  Weight: 209 lb 14.1 oz (95.2 kg) 207 lb 8 oz (94.1 kg) 208 lb 15.9 oz (94.8 kg)    Intake/Output:   Intake/Output Summary (Last 24 hours) at 05/26/16 0807 Last data filed at 05/26/16 0800  Gross per 24 hour  Intake           930.53 ml  Output              850 ml  Net            80.53 ml     Physical Exam: General: Chronically ill and fatigued appearing. NAD at rest.  HEENT: Normal Neck: supple. JVD 7-8. Carotids 2+ bilat; no bruits. No thyromegaly or nodule noted. Cor: PMI nondisplaced. RRR, 1/6 early SEM RUSB.  Lungs: Diminished throughout.  Abdomen: soft, non-tender, distended, no HSM. No bruits or masses. +BS   Extremities: no cyanosis, clubbing, or rash. Trace ankle edema at most. Slightly cool.   Neuro: alert & orientedx3, cranial nerves grossly intact. moves all 4 extremities w/o difficulty. Affect pleasant   Telemetry: Personally reviewed, NSR with PVCs.   Labs: CBC  Recent Labs  05/25/16 0435 05/26/16 0421  WBC 7.6 7.2  NEUTROABS 5.5  --   HGB 11.6* 11.2*  HCT 35.8* 34.7*  MCV 89.1 89.2  PLT 165 152   Basic Metabolic Panel  Recent Labs  05/25/16 0435 05/26/16 0421  NA 135 134*  K 2.9* 4.0  CL 97* 100*  CO2 30 29  GLUCOSE 181* 257*  BUN 8 8  CREATININE 0.69 0.69  CALCIUM 8.1* 8.3*   Liver Function Tests  Recent Labs  05/25/16 0435  AST 16  ALT 16*  ALKPHOS 95  BILITOT 0.5  PROT 6.2*  ALBUMIN 2.6*   No results for input(s): LIPASE, AMYLASE in the last 72 hours. Cardiac Enzymes No results for input(s): CKTOTAL, CKMB, CKMBINDEX, TROPONINI in the last 72 hours.  BNP: BNP (last 3 results)  Recent Labs  04/05/16 1221 04/21/16 1208 05/24/16 1120  BNP 444.6* 356.2* 374.5*    ProBNP (last  3 results) No results for input(s): PROBNP in the last 8760 hours.   D-Dimer No results for input(s): DDIMER in the last 72 hours. Hemoglobin A1C  Recent Labs  05/25/16 0436  HGBA1C 7.6*   Fasting Lipid Panel No results for input(s): CHOL, HDL, LDLCALC, TRIG, CHOLHDL, LDLDIRECT in the last 72 hours. Thyroid Function Tests No results for input(s): TSH, T4TOTAL, T3FREE, THYROIDAB in the last 72 hours.  Invalid input(s): FREET3  Other results:  Imaging/Studies:  No results found.  Medications:     Scheduled Medications: . aspirin EC  81 mg Oral Daily  . atorvastatin  80 mg Oral Q2000  . chlorhexidine  10 mL Mouth/Throat BID  . Chlorhexidine Gluconate Cloth  6 each Topical Daily  . digoxin  0.125 mg Oral Daily  . ezetimibe  10 mg Oral Daily  . feeding supplement (GLUCERNA SHAKE)  237 mL Oral TID WC  . furosemide  80 mg Intravenous BID  . insulin  aspart  0-15 Units Subcutaneous TID WC  . insulin aspart  0-5 Units Subcutaneous QHS  . insulin glargine  15 Units Subcutaneous BID  . potassium chloride SA  40 mEq Oral BID  . sildenafil  20 mg Oral TID  . sodium chloride flush  3 mL Intravenous Q12H  . spironolactone  25 mg Oral Daily    Infusions: . sodium chloride 250 mL (05/26/16 0800)  . milrinone 0.25 mcg/kg/min (05/26/16 0800)    PRN Medications: sodium chloride, acetaminophen, ondansetron (ZOFRAN) IV, sodium chloride flush, sodium chloride flush   Assessment/Plan   1. Acute on chronic systolic CHF due to ICM s/p St Jude ICD - Echo 03/2016 with EF 20-25%. Minimal improvement s/p PCI in 12/17 - Volume status improving on IV lasix. CVP 8-9. Continue IV lasix 80 mg BID for now - Continue milrinone 0.25 mcg/kg/min.  - Continue digoxin 0.125 mg daily.  - Out of network exception approval received. Plan for LVAD placement next week.  2. CAD - 3 vessel CAD s/p stent to proximal LAD and PTCA to subtotally occluded large OM1 in 1/18.  - No change in symptoms or EF with intervention - Plavix on hold. Hope to place HVAD next week - Has been > 3 months since PCI - Continue ASA and statin 3. R Pleural effusion - Chronic, transudate - Pleurx in place. Drain daily. Leave in through surgery. 4. Pulmonary HTN - Continue revatio 20 mg TID.  - RHC 05/25/16 as above.  5. Smoking - Stopped smoking earlier this week.   6. Hypokalemia -  Stable this am.   Plan for LVAD Tuesday of next week tentatively.   Length of Stay: 2  Luane School  05/26/2016, 8:07 AM  Advanced Heart Failure Team Pager [phone] (M-F; 7a - 4p)  Please contact [facility] for night-coverage after hours (4p -7a ) and weekends on [email or web address]  Patient seen with PA, agree with the above note.  Cardiac output adequate by [name] and co-ox.  Mild volume overload.  - Continue IV Lasix today.   - Continue current milrinone.   Plan for HVAD placement  on Tuesday.    Drain PleurX catheter daily.   Marca Ancona 05/26/2016 10:00 AM

## 2016-05-27 ENCOUNTER — Inpatient Hospital Stay (HOSPITAL_COMMUNITY): Payer: BLUE CROSS/BLUE SHIELD

## 2016-05-27 DIAGNOSIS — I272 Pulmonary hypertension, unspecified: Secondary | ICD-10-CM

## 2016-05-27 LAB — BASIC METABOLIC PANEL
Anion gap: 6 (ref 5–15)
BUN: 8 mg/dL (ref 6–20)
CHLORIDE: 101 mmol/L (ref 101–111)
CO2: 27 mmol/L (ref 22–32)
Calcium: 8.2 mg/dL — ABNORMAL LOW (ref 8.9–10.3)
Creatinine, Ser: 0.61 mg/dL (ref 0.61–1.24)
GFR calc Af Amer: >60 mL/min (ref >60)
GLUCOSE: 234 mg/dL — AB (ref 65–99)
Potassium: 3.9 mmol/L (ref 3.5–5.1)
Sodium: 134 mmol/L — ABNORMAL LOW (ref 135–145)

## 2016-05-27 LAB — CBC
HEMATOCRIT: 35.2 % — AB (ref 39.0–52.0)
Hemoglobin: 11.5 g/dL — ABNORMAL LOW (ref 13.0–17.0)
MCH: 29 pg (ref 26.0–34.0)
MCHC: 32.7 g/dL (ref 30.0–36.0)
MCV: 88.7 fL (ref 78.0–100.0)
PLATELETS: 139 10*3/uL — AB (ref 150–400)
RBC: 3.97 MIL/uL — AB (ref 4.22–5.81)
RDW: 16.6 % — ABNORMAL HIGH (ref 11.5–15.5)
WBC: 7.9 10*3/uL (ref 4.0–10.5)

## 2016-05-27 LAB — COOXEMETRY PANEL
Carboxyhemoglobin: 1.6 % — ABNORMAL HIGH (ref 0.5–1.5)
Methemoglobin: 1 % (ref 0.0–1.5)
O2 SAT: 64.4 %
Total hemoglobin: 9.9 g/dL — ABNORMAL LOW (ref 12.0–16.0)

## 2016-05-27 LAB — GLUCOSE, CAPILLARY
GLUCOSE-CAPILLARY: 252 mg/dL — AB (ref 65–99)
GLUCOSE-CAPILLARY: 214 mg/dL — AB (ref 65–99)
Glucose-Capillary: 194 mg/dL — ABNORMAL HIGH (ref 65–99)
Glucose-Capillary: 343 mg/dL — ABNORMAL HIGH (ref 65–99)

## 2016-05-27 MED ORDER — METOLAZONE 2.5 MG PO TABS
2.5000 mg | ORAL_TABLET | Freq: Once | ORAL | Status: AC
  Administered 2016-05-27: 2.5 mg via ORAL

## 2016-05-27 MED ORDER — POTASSIUM CHLORIDE CRYS ER 20 MEQ PO TBCR
20.0000 meq | EXTENDED_RELEASE_TABLET | Freq: Once | ORAL | Status: AC
  Administered 2016-05-27: 20 meq via ORAL
  Filled 2016-05-27: qty 1

## 2016-05-27 NOTE — Progress Notes (Signed)
Patient ID: Dalton RainwaterLarry D Maddison, male   DOB: [birth year], 61 y.o.   MRN: [id number]     Advanced Heart Failure Rounding Note  Primary Cardiologist: Dr. Shirlee LatchMcLean   Subjective:    Admitted from home 05/24/16 with worsening SOB and volume overload.   RHC 5/24 with mildly elevated PCWP, Mild to moderate PH, primarily venous, and adequate CO on milrinone.   He was negative > 1 L yesterday including PleurX drainage.  Weight down.    He continues on milrinone 0.25.    Swan numbers this am:  RA 10 PA 72/38 PCWP 30  CI 2.1 Co-ox 64%  RHC 05/25/16 RA mean 2 RV 50/6 PA 52/19, mean 33 PCWP mean 19 Oxygen saturations: PA 71% AO 100% Cardiac Output (Fick) 6.31 Cardiac Index (Fick) 2.91 PVR 2.2 WU Cardiac Output (Thermo) 4.86 Cardiac Index (Thermo) 2.24 PVR 2.9 WU  Objective:   Weight Range: 200 lb 9.9 oz (91 kg) Body mass index is 27.21 kg/m.   Vital Signs:   Temp:  [97 F (36.1 C)-98.6 F (37 C)] 97.4 F (36.3 C) (05/26 0749) Pulse Rate:  [52-90] 82 (05/26 0700) Resp:  [11-25] 11 (05/26 0700) BP: (86-124)/(59-73) 123/72 (05/26 0700) SpO2:  [89 %-100 %] 95 % (05/26 0700) Weight:  [200 lb 9.9 oz (91 kg)] 200 lb 9.9 oz (91 kg) (05/26 0500) Last BM Date: 05/26/16  Weight change: Filed Weights   05/25/16 0500 05/26/16 0500 05/27/16 0500  Weight: 207 lb 8 oz (94.1 kg) 208 lb 15.9 oz (94.8 kg) 200 lb 9.9 oz (91 kg)    Intake/Output:   Intake/Output Summary (Last 24 hours) at 05/27/16 0854 Last data filed at 05/27/16 0600  Gross per 24 hour  Intake           1456.8 ml  Output             3000 ml  Net          -1543.2 ml     Physical Exam: General: Chronically ill and fatigued appearing. NAD at rest.  HEENT: Normal Neck: supple. JVD 8-9. Carotids 2+ bilat; no bruits. No thyromegaly or nodule noted. Cor: PMI nondisplaced. RRR, 1/6 early SEM RUSB.  Lungs: Decreased BS right base.   Abdomen: soft, non-tender, distended, no HSM. No bruits or masses. +BS  Extremities: no  cyanosis, clubbing, or rash. Trace ankle edema at most. Slightly cool.   Neuro: alert & orientedx3, cranial nerves grossly intact. moves all 4 extremities w/o difficulty. Affect pleasant   Telemetry: Personally reviewed, NSR.   Labs: CBC  Recent Labs  05/25/16 0435 05/26/16 0421 05/27/16 0601  WBC 7.6 7.2 7.9  NEUTROABS 5.5  --   --   HGB 11.6* 11.2* 11.5*  HCT 35.8* 34.7* 35.2*  MCV 89.1 89.2 88.7  PLT 165 152 139*   Basic Metabolic Panel  Recent Labs  05/26/16 0421 05/27/16 0601  NA 134* 134*  K 4.0 3.9  CL 100* 101  CO2 29 27  GLUCOSE 257* 234*  BUN 8 8  CREATININE 0.69 0.61  CALCIUM 8.3* 8.2*   Liver Function Tests  Recent Labs  05/25/16 0435  AST 16  ALT 16*  ALKPHOS 95  BILITOT 0.5  PROT 6.2*  ALBUMIN 2.6*   No results for input(s): LIPASE, AMYLASE in the last 72 hours. Cardiac Enzymes No results for input(s): CKTOTAL, CKMB, CKMBINDEX, TROPONINI in the last 72 hours.  BNP: BNP (last 3 results)  Recent Labs  04/05/16 1221 04/21/16 1208 05/24/16  1120  BNP 444.6* 356.2* 374.5*    ProBNP (last 3 results) No results for input(s): PROBNP in the last 8760 hours.   D-Dimer No results for input(s): DDIMER in the last 72 hours. Hemoglobin A1C  Recent Labs  05/25/16 0436  HGBA1C 7.6*   Fasting Lipid Panel No results for input(s): CHOL, HDL, LDLCALC, TRIG, CHOLHDL, LDLDIRECT in the last 72 hours. Thyroid Function Tests No results for input(s): TSH, T4TOTAL, T3FREE, THYROIDAB in the last 72 hours.  Invalid input(s): FREET3  Other results:  Imaging/Studies:  No results found.  Medications:     Scheduled Medications: . aspirin EC  81 mg Oral Daily  . atorvastatin  80 mg Oral Q2000  . chlorhexidine  10 mL Mouth/Throat BID  . Chlorhexidine Gluconate Cloth  6 each Topical Daily  . digoxin  0.125 mg Oral Daily  . enoxaparin (LOVENOX) injection  40 mg Subcutaneous Q24H  . ezetimibe  10 mg Oral Daily  . feeding supplement (GLUCERNA  SHAKE)  237 mL Oral TID WC  . furosemide  80 mg Intravenous BID  . insulin aspart  0-15 Units Subcutaneous TID WC  . insulin aspart  0-5 Units Subcutaneous QHS  . insulin glargine  15 Units Subcutaneous BID  . metolazone  2.5 mg Oral Once  . potassium chloride  20 mEq Oral Once  . potassium chloride SA  40 mEq Oral BID  . sildenafil  20 mg Oral TID  . sodium chloride flush  3 mL Intravenous Q12H  . spironolactone  25 mg Oral Daily    Infusions: . sodium chloride 250 mL (05/26/16 2000)  . milrinone 0.25 mcg/kg/min (05/27/16 0304)    PRN Medications: sodium chloride, acetaminophen, ondansetron (ZOFRAN) IV, sodium chloride flush, sodium chloride flush   Assessment/Plan   1. Acute on chronic systolic CHF due to ischemic cardiomyopathy s/p St Jude ICD.  Echo (5/25) with EF 20-25%, moderate LV dilation, moderate MR, mildly dilated RV with normal systolic function, moderate TR, PASP 83.  Weight down but Swan numbers look worse today in terms of volume overload.  Suspect primarily pulmonary venous hypertension. Cardiac output remains adequate on milrinone and creatinine stable.  - Continue IV lasix 80 mg bid and will add metolazone 2.5 po x 1 today.  - Continue milrinone 0.25 mcg/kg/min.  - Continue digoxin 0.125 mg daily.  - Leave Swan in place today.  If numbers improved, potentially remove tomorrow.  - Out of network exception approval received. Plan for HVAD placement Tuesday.  2. CAD: 3 vessel CAD s/p stent to proximal LAD and PTCA to subtotally occluded large OM1 in 1/18. No change in symptoms or EF with intervention - Plavix on hold prior to VAD placement, has been > 3 months since PCI.  - Continue ASA and statin 3. R Pleural effusion: Chronic, transudate.  - PleurX in place. Drain daily. Leave in through surgery. 4. Pulmonary HTN: PA pressure higher today by [name].  Based on elevated PCWP, concern for pulmonary venous hypertension.  RV function looked adequate on 5/25 echo  (reviewed).  - Continue revatio 20 mg TID, will increase if PCWP comes down.  5. Smoking: Stopped at admission.    6. Hypokalemia: Stable this am.   Plan for HVAD Tuesday of next week.    Length of Stay: 3  Marca Ancona, MD  05/27/2016, 8:54 AM  Advanced Heart Failure Team Pager [phone] (M-F; 7a - 4p)  Please contact [facility] for night-coverage after hours (4p -7a ) and weekends on  [email or web address]

## 2016-05-28 ENCOUNTER — Encounter (HOSPITAL_COMMUNITY): Payer: Self-pay | Admitting: Certified Registered Nurse Anesthetist

## 2016-05-28 LAB — BASIC METABOLIC PANEL
Anion gap: 7 (ref 5–15)
BUN: 7 mg/dL (ref 6–20)
CALCIUM: 8.2 mg/dL — AB (ref 8.9–10.3)
CO2: 28 mmol/L (ref 22–32)
CREATININE: 0.66 mg/dL (ref 0.61–1.24)
Chloride: 97 mmol/L — ABNORMAL LOW (ref 101–111)
GFR calc non Af Amer: >60 mL/min (ref >60)
Glucose, Bld: 232 mg/dL — ABNORMAL HIGH (ref 65–99)
Potassium: 3.7 mmol/L (ref 3.5–5.1)
SODIUM: 132 mmol/L — AB (ref 135–145)

## 2016-05-28 LAB — CBC
HCT: 36.6 % — ABNORMAL LOW (ref 39.0–52.0)
HEMOGLOBIN: 11.9 g/dL — AB (ref 13.0–17.0)
MCH: 28.6 pg (ref 26.0–34.0)
MCHC: 32.5 g/dL (ref 30.0–36.0)
MCV: 88 fL (ref 78.0–100.0)
Platelets: 146 10*3/uL — ABNORMAL LOW (ref 150–400)
RBC: 4.16 MIL/uL — ABNORMAL LOW (ref 4.22–5.81)
RDW: 16.7 % — AB (ref 11.5–15.5)
WBC: 8.1 10*3/uL (ref 4.0–10.5)

## 2016-05-28 LAB — GLUCOSE, CAPILLARY
GLUCOSE-CAPILLARY: 241 mg/dL — AB (ref 65–99)
GLUCOSE-CAPILLARY: 179 mg/dL — AB (ref 65–99)
Glucose-Capillary: 366 mg/dL — ABNORMAL HIGH (ref 65–99)
Glucose-Capillary: 292 mg/dL — ABNORMAL HIGH (ref 65–99)

## 2016-05-28 LAB — COOXEMETRY PANEL
Carboxyhemoglobin: 2 % — ABNORMAL HIGH (ref 0.5–1.5)
METHEMOGLOBIN: 0.8 % (ref 0.0–1.5)
O2 Saturation: 62 %
Total hemoglobin: 11.3 g/dL — ABNORMAL LOW (ref 12.0–16.0)

## 2016-05-28 LAB — PLATELET INHIBITION P2Y12: Platelet Function  P2Y12: 141 [PRU] — ABNORMAL LOW (ref 194–418)

## 2016-05-28 MED ORDER — INSULIN ASPART 100 UNIT/ML ~~LOC~~ SOLN
0.0000 [IU] | Freq: Three times a day (TID) | SUBCUTANEOUS | Status: DC
  Administered 2016-05-28: 20 [IU] via SUBCUTANEOUS
  Administered 2016-05-28: 11 [IU] via SUBCUTANEOUS
  Administered 2016-05-28: 7 [IU] via SUBCUTANEOUS
  Administered 2016-05-29: 4 [IU] via SUBCUTANEOUS
  Administered 2016-05-29: 20 [IU] via SUBCUTANEOUS
  Administered 2016-05-29: 11 [IU] via SUBCUTANEOUS

## 2016-05-28 MED ORDER — INSULIN GLARGINE 100 UNIT/ML ~~LOC~~ SOLN
20.0000 [IU] | Freq: Two times a day (BID) | SUBCUTANEOUS | Status: DC
Start: 2016-05-28 — End: 2016-05-30
  Administered 2016-05-28 – 2016-05-29 (×3): 20 [IU] via SUBCUTANEOUS
  Filled 2016-05-28 (×4): qty 0.2

## 2016-05-28 NOTE — Progress Notes (Signed)
Patient ID: [name], male   DOB: 1955-06-06, 61 y.o.   MRN: [id number]     Advanced Heart Failure Rounding Note  Primary Cardiologist: Dr. Shirlee LatchMcLean   Subjective:    Admitted from home 05/24/16 with worsening SOB and volume overload.   RHC 5/24 with mildly elevated PCWP, Mild to moderate PH, primarily venous, and adequate CO on milrinone.   Good diuresis yesterday with metolazone, PA pressure lower.  600 cc drained from PleurX.    He continues on milrinone 0.25.    Swan numbers this am:  RA 6 PA 52/27 PCWP 25  CI 2.26 Co-ox 62%  RHC 05/25/16 RA mean 2 RV 50/6 PA 52/19, mean 33 PCWP mean 19 Oxygen saturations: PA 71% AO 100% Cardiac Output (Fick) 6.31 Cardiac Index (Fick) 2.91 PVR 2.2 WU Cardiac Output (Thermo) 4.86 Cardiac Index (Thermo) 2.24 PVR 2.9 WU  Objective:   Weight Range: 204 lb 12.9 oz (92.9 kg) Body mass index is 27.78 kg/m.   Vital Signs:   Temp:  [96.6 F (35.9 C)-98.7 F (37.1 C)] 97.3 F (36.3 C) (05/27 0700) Pulse Rate:  [77] 77 (05/26 0800) Resp:  [10-25] 22 (05/27 0700) BP: (106-134)/(61-103) 118/74 (05/27 0700) SpO2:  [97 %-99 %] 97 % (05/27 0400) Weight:  [204 lb 12.9 oz (92.9 kg)] 204 lb 12.9 oz (92.9 kg) (05/27 0500) Last BM Date: 05/26/16  Weight change: Filed Weights   05/26/16 0500 05/27/16 0500 05/28/16 0500  Weight: 208 lb 15.9 oz (94.8 kg) 200 lb 9.9 oz (91 kg) 204 lb 12.9 oz (92.9 kg)    Intake/Output:   Intake/Output Summary (Last 24 hours) at 05/28/16 0745 Last data filed at 05/28/16 0600  Gross per 24 hour  Intake           1737.6 ml  Output             4700 ml  Net          -2962.4 ml     Physical Exam: General: Chronically ill and fatigued appearing. NAD at rest.  HEENT: Normal Neck: supple. JVD 7. Carotids 2+ bilat; no bruits. No thyromegaly or nodule noted. Cor: PMI nondisplaced. RRR, 1/6 early SEM RUSB.  Lungs: Decreased breath sounds at right base.   Abdomen: soft, non-tender, distended, no HSM. No  bruits or masses. +BS  Extremities: no cyanosis, clubbing, or rash. Trace ankle edema at most. Slightly cool.   Neuro: alert & orientedx3, cranial nerves grossly intact. moves all 4 extremities w/o difficulty. Affect pleasant   Telemetry: Personally reviewed, NSR.   Labs: CBC  Recent Labs  05/27/16 0601 05/28/16 0548  WBC 7.9 8.1  HGB 11.5* 11.9*  HCT 35.2* 36.6*  MCV 88.7 88.0  PLT 139* 146*   Basic Metabolic Panel  Recent Labs  05/27/16 0601 05/28/16 0548  NA 134* 132*  K 3.9 3.7  CL 101 97*  CO2 27 28  GLUCOSE 234* 232*  BUN 8 7  CREATININE 0.61 0.66  CALCIUM 8.2* 8.2*   Liver Function Tests No results for input(s): AST, ALT, ALKPHOS, BILITOT, PROT, ALBUMIN in the last 72 hours. No results for input(s): LIPASE, AMYLASE in the last 72 hours. Cardiac Enzymes No results for input(s): CKTOTAL, CKMB, CKMBINDEX, TROPONINI in the last 72 hours.  BNP: BNP (last 3 results)  Recent Labs  04/05/16 1221 04/21/16 1208 05/24/16 1120  BNP 444.6* 356.2* 374.5*    ProBNP (last 3 results) No results for input(s): PROBNP in the last 8760 hours.  D-Dimer No results for input(s): DDIMER in the last 72 hours. Hemoglobin A1C No results for input(s): HGBA1C in the last 72 hours. Fasting Lipid Panel No results for input(s): CHOL, HDL, LDLCALC, TRIG, CHOLHDL, LDLDIRECT in the last 72 hours. Thyroid Function Tests No results for input(s): TSH, T4TOTAL, T3FREE, THYROIDAB in the last 72 hours.  Invalid input(s): FREET3  Other results:  Imaging/Studies:  Dg Chest Port 1 View  Result Date: 05/27/2016 CLINICAL DATA:  Central line placement. EXAM: PORTABLE CHEST 1 VIEW COMPARISON:  05/25/2016 FINDINGS: Cardiac pacemaker. Right PICC line remains unchanged in position with tip over the cavoatrial junction region. A right jugular central venous catheter has been placed with tip in a right lateral lower lobe pulmonary artery. Presumably this represents a Swan-Ganz catheter.  Mild cardiac enlargement. Right pleural effusion with atelectasis or infiltration in the right lung base. Right chest tube in place. Tiny residual apical pneumothorax. Old right rib fractures. IMPRESSION: Interval placement of a right Swan-Ganz catheter with tip over a peripheral right lower lobe pulmonary artery. Examination is otherwise unchanged. Cardiac enlargement. Right pleural effusion with basilar atelectasis or infiltration. Tiny residual right apical pneumothorax with right chest tube in place. Electronically Signed   By: Burman Nieves M.D.   On: 05/27/2016 22:10    Medications:     Scheduled Medications: . aspirin EC  81 mg Oral Daily  . atorvastatin  80 mg Oral Q2000  . chlorhexidine  10 mL Mouth/Throat BID  . Chlorhexidine Gluconate Cloth  6 each Topical Daily  . digoxin  0.125 mg Oral Daily  . enoxaparin (LOVENOX) injection  40 mg Subcutaneous Q24H  . ezetimibe  10 mg Oral Daily  . feeding supplement (GLUCERNA SHAKE)  237 mL Oral TID WC  . furosemide  80 mg Intravenous BID  . insulin aspart  0-15 Units Subcutaneous TID WC  . insulin aspart  0-5 Units Subcutaneous QHS  . insulin glargine  15 Units Subcutaneous BID  . potassium chloride SA  40 mEq Oral BID  . sildenafil  20 mg Oral TID  . sodium chloride flush  3 mL Intravenous Q12H  . spironolactone  25 mg Oral Daily    Infusions: . sodium chloride 250 mL (05/26/16 2000)  . milrinone 0.25 mcg/kg/min (05/28/16 0536)    PRN Medications: sodium chloride, acetaminophen, ondansetron (ZOFRAN) IV, sodium chloride flush, sodium chloride flush   Assessment/Plan   1. Acute on chronic systolic CHF due to ischemic cardiomyopathy s/p St Jude ICD.  Echo (5/25) with EF 20-25%, moderate LV dilation, moderate MR, mildly dilated RV with normal systolic function, moderate TR, PASP 83.  He diuresed well yesterday with addition of metolazone, CVP down to 6 though PCWP remains elevated at 25.  PA pressure has also fallen. Cardiac output  remains adequate on milrinone and creatinine stable.  - Continue IV lasix 80 mg bid but will not give metolazone today.  - Continue milrinone 0.25 mcg/kg/min.  - Continue digoxin 0.125 mg daily.  - Remove Swan today to allow him to walk around prior to surgery.  - Out of network exception approval received. Plan for HVAD placement Tuesday.  2. CAD: 3 vessel CAD s/p stent to proximal LAD and PTCA to subtotally occluded large OM1 in 1/18. No change in symptoms or EF with intervention - Plavix on hold prior to VAD placement, has been > 3 months since PCI.  - Continue ASA and statin 3. R Pleural effusion: Chronic, transudate.  - PleurX in place. Drain daily. Leave  in through surgery. 4. Pulmonary HTN: Suspect primarily pulmonary venous hypertension.  PA pressure lower today.  RV function looked adequate on 5/25 echo (reviewed).  - Continue revatio 20 mg TID.  5. Smoking: Stopped at admission.    6. Hypokalemia: Stable this am.   Plan for HVAD Tuesday of next week.    Length of Stay: 4  Marca Ancona, MD  05/28/2016, 7:45 AM  Advanced Heart Failure Team Pager [phone] (M-F; 7a - 4p)  Please contact [facility] for night-coverage after hours (4p -7a ) and weekends on [email or web address]

## 2016-05-28 NOTE — Progress Notes (Signed)
3 Days Post-Op Procedure(s) (LRB): Right Heart Cath (N/A) Subjective: Patient looks good Platelet function returning towards normal- P2 Y 12 assay today at 140. Patient will need to have P2 Y 12 level greater than 200 and time of surgery-VAD implant We'll check assay again tomorrow and if close to 200 we'll proceed with surgery as scheduled Tuesday a.m.  Will bring power cord of VAD out left upper quadrant since Pleurx catheter is in right side  We'll continue to use  right neck sleeve for blood draw CVP and co-Ox determination and remove  sleeve early a.m. of surgery  Procedure discussed with patient including choice of H VAD as planned heart pump implant for his destination therapy of advanced heart failure. He understands he is not on a transplant list because he is not a candidate for transplant with recent smoking history, but could potentially be evaluated for transplantation in the future  Objective: Vital signs in last 24 hours: Temp:  [96.6 F (35.9 C)-98.7 F (37.1 C)] 98.1 F (36.7 C) (05/27 1146) Cardiac Rhythm: Normal sinus rhythm (05/27 0400) Resp:  [11-25] 21 (05/27 1053) BP: (97-134)/(61-103) 110/64 (05/27 1053) SpO2:  [97 %-98 %] 98 % (05/27 0800) Weight:  [204 lb 12.9 oz (92.9 kg)] 204 lb 12.9 oz (92.9 kg) (05/27 0500)  Hemodynamic parameters for last 24 hours: PAP: (46-77)/(21-52) 52/31 CVP:  [3 mmHg-13 mmHg] 5 mmHg PCWP:  [25 mmHg-26 mmHg] 25 mmHg CO:  [4.4 L/min-6.1 L/min] 4.9 L/min CI:  [2 L/min/m2-2.8 L/min/m2] 2.3 L/min/m2  Intake/Output from previous day: 05/26 0701 - 05/27 0700 In: 1737.6 [P.O.:1320; I.V.:417.6] Out: 4700 [Urine:4700] Intake/Output this shift: Total I/O In: 584.4 [P.O.:480; I.V.:104.4] Out: 900 [Urine:900]  Up in chair Mild edema Lungs clear  Lab Results:  Recent Labs  05/27/16 0601 05/28/16 0548  WBC 7.9 8.1  HGB 11.5* 11.9*  HCT 35.2* 36.6*  PLT 139* 146*   BMET:  Recent Labs  05/27/16 0601 05/28/16 0548  NA  134* 132*  K 3.9 3.7  CL 101 97*  CO2 27 28  GLUCOSE 234* 232*  BUN 8 7  CREATININE 0.61 0.66  CALCIUM 8.2* 8.2*    PT/INR: No results for input(s): LABPROT, INR in the last 72 hours. ABG    Component Value Date/Time   PHART 7.460 (H) 01/28/2016 0854   HCO3 30.1 (H) 05/25/2016 1447   HCO3 30.1 (H) 05/25/2016 1447   TCO2 32 05/25/2016 1447   TCO2 32 05/25/2016 1447   O2SAT 62.0 05/28/2016 0520   CBG (last 3)   Recent Labs  05/27/16 2111 05/28/16 0758 05/28/16 1145  GLUCAP 214* 241* 366*    Assessment/Plan: S/P Procedure(s) (LRB): Right Heart Cath (N/A) Repeat platelet functional assay Monday a.m. Proceed with VAD implantation Tuesday if platelet function is projected to be normal on Tuesday  Increase insulin dosing  ordered for better glucose control   LOS: 4 days    Kathlee Nationseter Van Trigt III 05/28/2016

## 2016-05-28 NOTE — Progress Notes (Signed)
Pt's father drained pt's pleurex yesterday for 600 cc. Fluid was pink tinged. Tammy SoursAngela Abdullah Rizzi

## 2016-05-28 NOTE — Progress Notes (Signed)
Upon shift assessment, [name] noted to be at 58 cm. Documented placement was 63 cm. Waveform demonstrates good placement. Swan secured. Dr. Tiburcio PeaHarris notified and portable chest xray obtained. Will hold off on wedging and preforming cardiac outputs at this time.

## 2016-05-29 ENCOUNTER — Encounter (HOSPITAL_COMMUNITY): Payer: BLUE CROSS/BLUE SHIELD

## 2016-05-29 LAB — MRSA PCR SCREENING: MRSA by PCR: NEGATIVE

## 2016-05-29 LAB — BASIC METABOLIC PANEL
ANION GAP: 8 (ref 5–15)
BUN: 11 mg/dL (ref 6–20)
CHLORIDE: 95 mmol/L — AB (ref 101–111)
CO2: 29 mmol/L (ref 22–32)
CREATININE: 0.77 mg/dL (ref 0.61–1.24)
Calcium: 8.6 mg/dL — ABNORMAL LOW (ref 8.9–10.3)
GFR calc non Af Amer: >60 mL/min (ref >60)
Glucose, Bld: 152 mg/dL — ABNORMAL HIGH (ref 65–99)
POTASSIUM: 5 mmol/L (ref 3.5–5.1)
Sodium: 132 mmol/L — ABNORMAL LOW (ref 135–145)

## 2016-05-29 LAB — GLUCOSE, CAPILLARY
GLUCOSE-CAPILLARY: 151 mg/dL — AB (ref 65–99)
Glucose-Capillary: 359 mg/dL — ABNORMAL HIGH (ref 65–99)
Glucose-Capillary: 261 mg/dL — ABNORMAL HIGH (ref 65–99)
Glucose-Capillary: 165 mg/dL — ABNORMAL HIGH (ref 65–99)

## 2016-05-29 LAB — PREPARE RBC (CROSSMATCH)

## 2016-05-29 LAB — COOXEMETRY PANEL
Carboxyhemoglobin: 1.5 % (ref 0.5–1.5)
METHEMOGLOBIN: 1.1 % (ref 0.0–1.5)
O2 Saturation: 60.5 %
Total hemoglobin: 12.4 g/dL (ref 12.0–16.0)

## 2016-05-29 LAB — CBC
HEMATOCRIT: 37.8 % — AB (ref 39.0–52.0)
HEMOGLOBIN: 12.2 g/dL — AB (ref 13.0–17.0)
MCH: 28.6 pg (ref 26.0–34.0)
MCHC: 32.3 g/dL (ref 30.0–36.0)
MCV: 88.7 fL (ref 78.0–100.0)
Platelets: 161 10*3/uL (ref 150–400)
RBC: 4.26 MIL/uL (ref 4.22–5.81)
RDW: 16.9 % — ABNORMAL HIGH (ref 11.5–15.5)
WBC: 10.2 10*3/uL (ref 4.0–10.5)

## 2016-05-29 LAB — PLATELET INHIBITION P2Y12
Platelet Function  P2Y12: 135 [PRU] — ABNORMAL LOW (ref 194–418)
Platelet Function  P2Y12: 173 [PRU] — ABNORMAL LOW (ref 194–418)

## 2016-05-29 MED ORDER — EPINEPHRINE PF 1 MG/ML IJ SOLN
0.0000 ug/min | INTRAVENOUS | Status: AC
  Administered 2016-05-30: 3 ug/kg/min via INTRAVENOUS
  Filled 2016-05-29 (×2): qty 4

## 2016-05-29 MED ORDER — CHLORHEXIDINE GLUCONATE 4 % EX LIQD
60.0000 mL | Freq: Once | CUTANEOUS | Status: AC
  Administered 2016-05-29: 4 via TOPICAL
  Filled 2016-05-29: qty 60

## 2016-05-29 MED ORDER — MUPIROCIN 2 % EX OINT: 1.0000 "application " | TOPICAL_OINTMENT | Freq: Two times a day (BID) | CUTANEOUS | Status: DC

## 2016-05-29 MED ORDER — DOPAMINE-DEXTROSE 3.2-5 MG/ML-% IV SOLN
0.0000 ug/kg/min | INTRAVENOUS | Status: DC
  Filled 2016-05-29 (×2): qty 250

## 2016-05-29 MED ORDER — TRANEXAMIC ACID (OHS) PUMP PRIME SOLUTION
2.0000 mg/kg | INTRAVENOUS | Status: DC
  Filled 2016-05-29 (×2): qty 1.83

## 2016-05-29 MED ORDER — VASOPRESSIN 20 UNIT/ML IV SOLN
0.0400 [IU]/min | INTRAVENOUS | Status: DC
  Filled 2016-05-29 (×2): qty 2

## 2016-05-29 MED ORDER — VANCOMYCIN HCL 1000 MG IV SOLR
1000.0000 mg | INTRAVENOUS | Status: AC
  Administered 2016-05-30: 2000 mg
  Filled 2016-05-29 (×2): qty 1000

## 2016-05-29 MED ORDER — VANCOMYCIN HCL 10 G IV SOLR
1500.0000 mg | INTRAVENOUS | Status: AC
  Administered 2016-05-30: 1500 mg via INTRAVENOUS
  Filled 2016-05-29 (×2): qty 1500

## 2016-05-29 MED ORDER — DIAZEPAM 2 MG PO TABS
2.0000 mg | ORAL_TABLET | Freq: Once | ORAL | Status: AC
  Administered 2016-05-30: 2 mg via ORAL
  Filled 2016-05-29: qty 1

## 2016-05-29 MED ORDER — SODIUM CHLORIDE 0.9 % IV SOLN
600.0000 mg | INTRAVENOUS | Status: AC
  Administered 2016-05-30: 600 mg via INTRAVENOUS
  Filled 2016-05-29 (×2): qty 600

## 2016-05-29 MED ORDER — PHENYLEPHRINE HCL 10 MG/ML IJ SOLN
0.0000 ug/min | INTRAMUSCULAR | Status: DC
  Filled 2016-05-29 (×2): qty 2

## 2016-05-29 MED ORDER — DOBUTAMINE IN D5W 4-5 MG/ML-% IV SOLN
2.0000 ug/kg/min | INTRAVENOUS | Status: DC
  Filled 2016-05-29 (×2): qty 250

## 2016-05-29 MED ORDER — TRANEXAMIC ACID (OHS) BOLUS VIA INFUSION
15.0000 mg/kg | INTRAVENOUS | Status: AC
  Administered 2016-05-30: 1374 mg via INTRAVENOUS
  Filled 2016-05-29 (×2): qty 1374

## 2016-05-29 MED ORDER — MAGNESIUM SULFATE 50 % IJ SOLN
40.0000 meq | INTRAMUSCULAR | Status: DC
  Filled 2016-05-29 (×2): qty 10

## 2016-05-29 MED ORDER — SODIUM CHLORIDE 0.9 % IV SOLN
INTRAVENOUS | Status: DC
  Filled 2016-05-29 (×2): qty 30

## 2016-05-29 MED ORDER — TEMAZEPAM 15 MG PO CAPS: 15.0000 mg | ORAL_CAPSULE | Freq: Once | ORAL | Status: DC | PRN

## 2016-05-29 MED ORDER — SORBITOL 70 % SOLN
30.0000 mL | Freq: Once | Status: AC
  Administered 2016-05-29: 30 mL via ORAL
  Filled 2016-05-29: qty 30

## 2016-05-29 MED ORDER — DEXTROSE 5 % IV SOLN
750.0000 mg | INTRAVENOUS | Status: DC
  Filled 2016-05-29 (×2): qty 750

## 2016-05-29 MED ORDER — DEXTROSE 5 % IV SOLN
1.5000 g | INTRAVENOUS | Status: AC
  Administered 2016-05-30: 1.5 g via INTRAVENOUS
  Administered 2016-05-30: .75 g via INTRAVENOUS
  Filled 2016-05-29 (×2): qty 1.5

## 2016-05-29 MED ORDER — SODIUM CHLORIDE 0.9 % IV SOLN
INTRAVENOUS | Status: AC
  Administered 2016-05-30: 2.7 [IU]/h via INTRAVENOUS
  Filled 2016-05-29 (×2): qty 1

## 2016-05-29 MED ORDER — POTASSIUM CHLORIDE CRYS ER 20 MEQ PO TBCR: 20.0000 meq | EXTENDED_RELEASE_TABLET | Freq: Once | ORAL | Status: DC

## 2016-05-29 MED ORDER — NOREPINEPHRINE BITARTRATE 1 MG/ML IV SOLN
0.0000 ug/min | INTRAVENOUS | Status: AC
  Administered 2016-05-30: 3 ug/kg/min via INTRAVENOUS
  Filled 2016-05-29 (×2): qty 4

## 2016-05-29 MED ORDER — BISACODYL 5 MG PO TBEC
5.0000 mg | DELAYED_RELEASE_TABLET | Freq: Once | ORAL | Status: AC
  Administered 2016-05-29: 5 mg via ORAL
  Filled 2016-05-29: qty 1

## 2016-05-29 MED ORDER — FLUCONAZOLE IN SODIUM CHLORIDE 400-0.9 MG/200ML-% IV SOLN
400.0000 mg | INTRAVENOUS | Status: AC
  Administered 2016-05-30: 400 mg via INTRAVENOUS
  Filled 2016-05-29 (×2): qty 200

## 2016-05-29 MED ORDER — CHLORHEXIDINE GLUCONATE 4 % EX LIQD
60.0000 mL | Freq: Once | CUTANEOUS | Status: AC
  Administered 2016-05-30: 4 via TOPICAL
  Filled 2016-05-29: qty 60

## 2016-05-29 MED ORDER — CHLORHEXIDINE GLUCONATE 0.12 % MT SOLN
15.0000 mL | Freq: Once | OROMUCOSAL | Status: AC
  Administered 2016-05-30: 15 mL via OROMUCOSAL
  Filled 2016-05-29: qty 15

## 2016-05-29 MED ORDER — MILRINONE LACTATE IN DEXTROSE 20-5 MG/100ML-% IV SOLN
0.3000 ug/kg/min | INTRAVENOUS | Status: DC
  Filled 2016-05-29 (×2): qty 100

## 2016-05-29 MED ORDER — POTASSIUM CHLORIDE 2 MEQ/ML IV SOLN
80.0000 meq | INTRAVENOUS | Status: DC
Start: 2016-05-30 — End: 2016-05-30
  Filled 2016-05-29 (×2): qty 40

## 2016-05-29 MED ORDER — CHLORHEXIDINE GLUCONATE 4 % EX LIQD
60.0000 mL | Freq: Once | CUTANEOUS | Status: AC
  Filled 2016-05-29: qty 60

## 2016-05-29 MED ORDER — NITROGLYCERIN IN D5W 200-5 MCG/ML-% IV SOLN
0.0000 ug/min | INTRAVENOUS | Status: DC
  Filled 2016-05-29: qty 250

## 2016-05-29 MED ORDER — TRANEXAMIC ACID 1000 MG/10ML IV SOLN
1.5000 mg/kg/h | INTRAVENOUS | Status: AC
  Administered 2016-05-30: 1.5 mg/kg/h via INTRAVENOUS
  Filled 2016-05-29 (×2): qty 25

## 2016-05-29 MED ORDER — DEXMEDETOMIDINE HCL IN NACL 400 MCG/100ML IV SOLN
0.1000 ug/kg/h | INTRAVENOUS | Status: AC
  Administered 2016-05-30: .3 ug/kg/h via INTRAVENOUS
  Filled 2016-05-29 (×2): qty 100

## 2016-05-29 MED ORDER — MUPIROCIN 2 % EX OINT
1.0000 "application " | TOPICAL_OINTMENT | Freq: Two times a day (BID) | CUTANEOUS | Status: DC
  Administered 2016-05-29 – 2016-05-30 (×2): 1 via NASAL
  Filled 2016-05-29: qty 22

## 2016-05-29 NOTE — Anesthesia Preprocedure Evaluation (Addendum)
Anesthesia Evaluation  Patient identified by MRN, date of birth, ID band Patient awake    Reviewed: Allergy & Precautions, NPO status , Patient's Chart, lab work & pertinent test results  Airway Mallampati: II  TM Distance: >3 FB Neck ROM: Full    Dental  (+) Dental Advisory Given   Pulmonary shortness of breath, Current Smoker,  Right sided pleural effusion s/p pleurex   breath sounds clear to auscultation       Cardiovascular hypertension, + CAD, + Cardiac Stents, +CHF and + DOE  + Cardiac Defibrillator + Valvular Problems/Murmurs  Rhythm:Regular Rate:Normal  Diffuse hypokinesis with akinesis of the inferior and   inferolateral walls; overall severe reduced LV systolic function; restrictive filling; moderate LVE; trace AI; moderate MR, moderate LAE; mild RVE; moderate TR with severely elevated pulmonary pressures.   Neuro/Psych negative neurological ROS     GI/Hepatic negative GI ROS, Neg liver ROS,   Endo/Other  diabetes, Type 2, Insulin Dependent  Renal/GU negative Renal ROS     Musculoskeletal  (+) Arthritis ,   Abdominal   Peds  Hematology  (+) anemia ,   Anesthesia Other Findings   Reproductive/Obstetrics                            Lab Results  Component Value Date   WBC 10.2 05/29/2016   HGB 12.2 (L) 05/29/2016   HCT 37.8 (L) 05/29/2016   MCV 88.7 05/29/2016   PLT 161 05/29/2016   Lab Results  Component Value Date   CREATININE 0.77 05/29/2016   BUN 11 05/29/2016   NA 132 (L) 05/29/2016   K 5.0 05/29/2016   CL 95 (L) 05/29/2016   CO2 29 05/29/2016    Anesthesia Physical Anesthesia Plan  ASA: IV  Anesthesia Plan: General   Post-op Pain Management:    Induction: Intravenous  Airway Management Planned: Oral ETT  Additional Equipment: Arterial line, TEE, CVP, PA Cath and Ultrasound Guidance Line Placement  Intra-op Plan:   Post-operative Plan: Post-operative  intubation/ventilation  Informed Consent: I have reviewed the patients History and Physical, chart, labs and discussed the procedure including the risks, benefits and alternatives for the proposed anesthesia with the patient or authorized representative who has indicated his/her understanding and acceptance.     Plan Discussed with:   Anesthesia Plan Comments:        Anesthesia Quick Evaluation

## 2016-05-29 NOTE — Progress Notes (Signed)
Patient ambulated in hallway x's 2 today total of 1110 ft. Consent for procedure and blood in chart. MRSA complete-negative. Worked with IS several times today-pulled 1200. Patient will need to shower with Hibiclens tonight and tomorrow am before OR.   Hermina BartersBOWMAN, Devora Tortorella M, RN

## 2016-05-29 NOTE — Progress Notes (Signed)
4 Days Post-Op Procedure(s) (LRB): Right Heart Cath (N/A) Subjective: Patient stable on milrinone He appears somewhat more fatigued and strained today Sinus rhythm CVP 6, venous: Oximetry 62% Repeat platelet function assay showing progressive improvement-  Now up to 173-should be adequate for surgery in a.m. with 5 full days of Plavix washout Objective: Vital signs in last 24 hours: Temp:  [98.1 F (36.7 C)-98.7 F (37.1 C)] 98.3 F (36.8 C) (05/28 0300) Pulse Rate:  [84-89] 84 (05/28 0300) Cardiac Rhythm: Normal sinus rhythm (05/28 0800) Resp:  [13-21] 16 (05/28 0925) BP: (105-168)/(40-76) 123/74 (05/28 0925) SpO2:  [95 %-99 %] 96 % (05/28 0300) Weight:  [202 lb (91.6 kg)] 202 lb (91.6 kg) (05/28 0537)  Hemodynamic parameters for last 24 hours:  stable  Intake/Output from previous day: 05/27 0701 - 05/28 0700 In: 1660.2 [P.O.:1260; I.V.:400.2] Out: 3900 [Urine:3000] Intake/Output this shift: Total I/O In: 87 [I.V.:87] Out: 825 [Urine:825]  Responsive and alert Lungs clear Minimal edema Abdomen soft Extremities warm  Lab Results:  Recent Labs  05/28/16 0548 05/29/16 0525  WBC 8.1 10.2  HGB 11.9* 12.2*  HCT 36.6* 37.8*  PLT 146* 161   BMET:  Recent Labs  05/28/16 0548 05/29/16 0525  NA 132* 132*  K 3.7 5.0  CL 97* 95*  CO2 28 29  GLUCOSE 232* 152*  BUN 7 11  CREATININE 0.66 0.77  CALCIUM 8.2* 8.6*    PT/INR: No results for input(s): LABPROT, INR in the last 72 hours. ABG    Component Value Date/Time   PHART 7.460 (H) 01/28/2016 0854   HCO3 30.1 (H) 05/25/2016 1447   HCO3 30.1 (H) 05/25/2016 1447   TCO2 32 05/25/2016 1447   TCO2 32 05/25/2016 1447   O2SAT 60.5 05/29/2016 0520   CBG (last 3)   Recent Labs  05/28/16 1708 05/28/16 2051 05/29/16 0742  GLUCAP 292* 179* 151*    Assessment/Plan: S/P Procedure(s) (LRB): Right Heart Cath (N/A) Plan implantation of hvad pump in a.m. for destination therapy The patient understands the  benefits as well as the risks of the surgery. We will assess the tricuspid valve with TEE-there will be some insufficiency related to the long-standing AICD leads We will assess and confirm there is no PFO prior to surgery. We'll plan to exit the power cord to the patient's left upper quadrant to stay away from his Pleurx catheter on the right   LOS: 5 days    Kathlee Nationseter Van Trigt III 05/29/2016

## 2016-05-29 NOTE — Progress Notes (Signed)
Patient ID: Dalton RainwaterLarry D [name], male   DOB: 1955/05/29, 61 y.o.   MRN: [id number]     Advanced Heart Failure Rounding Note  Primary Cardiologist: Dr. Shirlee LatchMcLean   Subjective:    Admitted from home 05/24/16 with worsening SOB and volume overload.   RHC 5/24 with mildly elevated PCWP, Mild to moderate PH, primarily venous, and adequate CO on milrinone.   600 cc drained from PleurX yesterday.    He continues on milrinone 0.25.  He diuresed well again yesterday, weight down 2 lbs.  Swan removed yesterday. Co-ox 61%.   He walked in hall 3 times today.   P2Y12 135 (down from 140 yesterday).   RHC 05/25/16 RA mean 2 RV 50/6 PA 52/19, mean 33 PCWP mean 19 Oxygen saturations: PA 71% AO 100% Cardiac Output (Fick) 6.31 Cardiac Index (Fick) 2.91 PVR 2.2 WU Cardiac Output (Thermo) 4.86 Cardiac Index (Thermo) 2.24 PVR 2.9 WU  Objective:   Weight Range: 202 lb (91.6 kg) Body mass index is 27.4 kg/m.   Vital Signs:   Temp:  [97 F (36.1 C)-98.7 F (37.1 C)] 98.3 F (36.8 C) (05/28 0300) Pulse Rate:  [84-89] 84 (05/28 0300) Resp:  [11-22] 19 (05/28 0300) BP: (97-168)/(40-76) 105/67 (05/28 0300) SpO2:  [95 %-99 %] 96 % (05/28 0300) Weight:  [202 lb (91.6 kg)] 202 lb (91.6 kg) (05/28 0537) Last BM Date: 05/27/16  Weight change: Filed Weights   05/27/16 0500 05/28/16 0500 05/29/16 0537  Weight: 200 lb 9.9 oz (91 kg) 204 lb 12.9 oz (92.9 kg) 202 lb (91.6 kg)    Intake/Output:   Intake/Output Summary (Last 24 hours) at 05/29/16 0732 Last data filed at 05/29/16 0500  Gross per 24 hour  Intake           1660.2 ml  Output             3900 ml  Net          -2239.8 ml     Physical Exam: General: NAD.  HEENT: Normal Neck: supple. JVD 7-8. Carotids 2+ bilat; no bruits. No thyromegaly or nodule noted. Cor: PMI nondisplaced. RRR, 1/6 early SEM RUSB.  Lungs: Decreased breath sounds at right base, PleurX on right.  Abdomen: soft, non-tender, distended, no HSM. No bruits or masses. +BS   Extremities: no cyanosis, clubbing, or rash. Trace ankle edema at most. Slightly cool.   Neuro: alert & orientedx3, cranial nerves grossly intact. moves all 4 extremities w/o difficulty. Affect pleasant   Telemetry: Personally reviewed, NSR.   Labs: CBC  Recent Labs  05/28/16 0548 05/29/16 0525  WBC 8.1 10.2  HGB 11.9* 12.2*  HCT 36.6* 37.8*  MCV 88.0 88.7  PLT 146* 161   Basic Metabolic Panel  Recent Labs  05/28/16 0548 05/29/16 0525  NA 132* 132*  K 3.7 5.0  CL 97* 95*  CO2 28 29  GLUCOSE 232* 152*  BUN 7 11  CREATININE 0.66 0.77  CALCIUM 8.2* 8.6*   Liver Function Tests No results for input(s): AST, ALT, ALKPHOS, BILITOT, PROT, ALBUMIN in the last 72 hours. No results for input(s): LIPASE, AMYLASE in the last 72 hours. Cardiac Enzymes No results for input(s): CKTOTAL, CKMB, CKMBINDEX, TROPONINI in the last 72 hours.  BNP: BNP (last 3 results)  Recent Labs  04/05/16 1221 04/21/16 1208 05/24/16 1120  BNP 444.6* 356.2* 374.5*    ProBNP (last 3 results) No results for input(s): PROBNP in the last 8760 hours.   D-Dimer No results for input(s):  DDIMER in the last 72 hours. Hemoglobin A1C No results for input(s): HGBA1C in the last 72 hours. Fasting Lipid Panel No results for input(s): CHOL, HDL, LDLCALC, TRIG, CHOLHDL, LDLDIRECT in the last 72 hours. Thyroid Function Tests No results for input(s): TSH, T4TOTAL, T3FREE, THYROIDAB in the last 72 hours.  Invalid input(s): FREET3  Other results:  Imaging/Studies:  No results found.  Medications:     Scheduled Medications: . aspirin EC  81 mg Oral Daily  . atorvastatin  80 mg Oral Q2000  . Chlorhexidine Gluconate Cloth  6 each Topical Daily  . digoxin  0.125 mg Oral Daily  . enoxaparin (LOVENOX) injection  40 mg Subcutaneous Q24H  . ezetimibe  10 mg Oral Daily  . feeding supplement (GLUCERNA SHAKE)  237 mL Oral TID WC  . furosemide  80 mg Intravenous BID  . insulin aspart  0-20 Units  Subcutaneous TID WC  . insulin aspart  0-5 Units Subcutaneous QHS  . insulin glargine  20 Units Subcutaneous BID  . potassium chloride  20 mEq Oral Once  . sildenafil  20 mg Oral TID  . sodium chloride flush  3 mL Intravenous Q12H  . spironolactone  25 mg Oral Daily    Infusions: . sodium chloride 250 mL (05/26/16 2000)  . milrinone 0.25 mcg/kg/min (05/28/16 0536)    PRN Medications: sodium chloride, acetaminophen, ondansetron (ZOFRAN) IV, sodium chloride flush, sodium chloride flush   Assessment/Plan   1. Acute on chronic systolic CHF due to ischemic cardiomyopathy s/p St Jude ICD.  Echo (5/25) with EF 20-25%, moderate LV dilation, moderate MR, mildly dilated RV with normal systolic function, moderate TR, PASP 83.  Cardiac output remains adequate on milrinone and creatinine stable.  He diuresed well yesterday, weight down another 2 lbs.  Swan out.  - Continue IV lasix 80 mg bid today, will start following CVPs now that [name] is out.  - Continue milrinone 0.25 mcg/kg/min.  - Continue digoxin 0.125 mg daily.  - Out of network exception approval received. Plan for HVAD placement this week when P2Y12 test is closer to 200.  135 today, which is lower than yesterday => will re-order this morning to make sure accurate.  2. CAD: 3 vessel CAD s/p stent to proximal LAD and PTCA to subtotally occluded large OM1 in 1/18. No change in symptoms or EF with intervention - Plavix on hold prior to VAD placement, has been > 3 months since PCI.  - Continue ASA and statin 3. R Pleural effusion: Chronic, transudate.  - PleurX in place. Drain daily. Leave in through surgery. 4. Pulmonary HTN: Suspect primarily pulmonary venous hypertension.  PA pressure by Swan fell with diuresis.  RV function looked adequate on 5/25 echo (reviewed).  - Continue revatio 20 mg TID.  5. Smoking: Stopped at admission.    6. Hypokalemia: K 5 this morning, cut back on K supplement.   Plan for HVAD this week when platelet  function returns.    Length of Stay: 5  Marca Ancona, MD  05/29/2016, 7:32 AM  Advanced Heart Failure Team Pager [phone] (M-F; 7a - 4p)  Please contact [facility] for night-coverage after hours (4p -7a ) and weekends on [email or web address]

## 2016-05-30 ENCOUNTER — Inpatient Hospital Stay (HOSPITAL_COMMUNITY): Payer: BLUE CROSS/BLUE SHIELD | Admitting: Certified Registered Nurse Anesthetist

## 2016-05-30 ENCOUNTER — Other Ambulatory Visit: Payer: Self-pay | Admitting: Internal Medicine

## 2016-05-30 ENCOUNTER — Encounter (HOSPITAL_COMMUNITY)
Admission: AD | Disposition: A | Discharge status: Home Health | Payer: Self-pay | Source: Ambulatory Visit | Attending: Cardiology

## 2016-05-30 ENCOUNTER — Inpatient Hospital Stay (HOSPITAL_COMMUNITY): Payer: BLUE CROSS/BLUE SHIELD

## 2016-05-30 HISTORY — PX: INSERTION OF IMPLANTABLE LEFT VENTRICULAR ASSIST DEVICE: SHX5866

## 2016-05-30 HISTORY — PX: TEE WITHOUT CARDIOVERSION: SHX5443

## 2016-05-30 LAB — CBC
HCT: 23.7 % — ABNORMAL LOW (ref 39.0–52.0)
HCT: 26.8 % — ABNORMAL LOW (ref 39.0–52.0)
HEMATOCRIT: 36.5 % — AB (ref 39.0–52.0)
HEMOGLOBIN: 12 g/dL — AB (ref 13.0–17.0)
Hemoglobin: 7.8 g/dL — ABNORMAL LOW (ref 13.0–17.0)
Hemoglobin: 8.7 g/dL — ABNORMAL LOW (ref 13.0–17.0)
MCH: 28.7 pg (ref 26.0–34.0)
MCH: 28.7 pg (ref 26.0–34.0)
MCH: 28.1 pg (ref 26.0–34.0)
MCHC: 32.9 g/dL (ref 30.0–36.0)
MCHC: 32.9 g/dL (ref 30.0–36.0)
MCHC: 32.5 g/dL (ref 30.0–36.0)
MCV: 87.3 fL (ref 78.0–100.0)
MCV: 87.1 fL (ref 78.0–100.0)
MCV: 86.5 fL (ref 78.0–100.0)
Platelets: 139 10*3/uL — ABNORMAL LOW (ref 150–400)
Platelets: 158 10*3/uL (ref 150–400)
Platelets: 172 10*3/uL (ref 150–400)
RBC: 4.18 MIL/uL — AB (ref 4.22–5.81)
RBC: 2.72 MIL/uL — ABNORMAL LOW (ref 4.22–5.81)
RBC: 3.1 MIL/uL — ABNORMAL LOW (ref 4.22–5.81)
RDW: 16.8 % — ABNORMAL HIGH (ref 11.5–15.5)
RDW: 16.6 % — ABNORMAL HIGH (ref 11.5–15.5)
RDW: 16.2 % — ABNORMAL HIGH (ref 11.5–15.5)
WBC: 11.1 10*3/uL — ABNORMAL HIGH (ref 4.0–10.5)
WBC: 13.3 10*3/uL — ABNORMAL HIGH (ref 4.0–10.5)
WBC: 11.4 10*3/uL — ABNORMAL HIGH (ref 4.0–10.5)

## 2016-05-30 LAB — POCT I-STAT, CHEM 8
BUN: 12 mg/dL (ref 6–20)
BUN: 10 mg/dL (ref 6–20)
BUN: 9 mg/dL (ref 6–20)
BUN: 9 mg/dL (ref 6–20)
BUN: 8 mg/dL (ref 6–20)
CALCIUM ION: 1.17 mmol/L (ref 1.15–1.40)
CALCIUM ION: 1.15 mmol/L (ref 1.15–1.40)
CALCIUM ION: 1.22 mmol/L (ref 1.15–1.40)
CREATININE: 0.4 mg/dL — AB (ref 0.61–1.24)
CREATININE: 0.5 mg/dL — AB (ref 0.61–1.24)
Calcium, Ion: 1 mmol/L — ABNORMAL LOW (ref 1.15–1.40)
Calcium, Ion: 1.17 mmol/L (ref 1.15–1.40)
Chloride: 92 mmol/L — ABNORMAL LOW (ref 101–111)
Chloride: 95 mmol/L — ABNORMAL LOW (ref 101–111)
Chloride: 92 mmol/L — ABNORMAL LOW (ref 101–111)
Chloride: 93 mmol/L — ABNORMAL LOW (ref 101–111)
Chloride: 98 mmol/L — ABNORMAL LOW (ref 101–111)
Creatinine, Ser: 0.4 mg/dL — ABNORMAL LOW (ref 0.61–1.24)
Creatinine, Ser: 0.3 mg/dL — ABNORMAL LOW (ref 0.61–1.24)
Creatinine, Ser: 0.4 mg/dL — ABNORMAL LOW (ref 0.61–1.24)
GLUCOSE: 193 mg/dL — AB (ref 65–99)
GLUCOSE: 187 mg/dL — AB (ref 65–99)
GLUCOSE: 164 mg/dL — AB (ref 65–99)
GLUCOSE: 170 mg/dL — AB (ref 65–99)
Glucose, Bld: 117 mg/dL — ABNORMAL HIGH (ref 65–99)
HCT: 36 % — ABNORMAL LOW (ref 39.0–52.0)
HCT: 32 % — ABNORMAL LOW (ref 39.0–52.0)
HCT: 24 % — ABNORMAL LOW (ref 39.0–52.0)
HEMATOCRIT: 26 % — AB (ref 39.0–52.0)
HEMATOCRIT: 24 % — AB (ref 39.0–52.0)
HEMOGLOBIN: 12.2 g/dL — AB (ref 13.0–17.0)
HEMOGLOBIN: 10.9 g/dL — AB (ref 13.0–17.0)
HEMOGLOBIN: 8.8 g/dL — AB (ref 13.0–17.0)
HEMOGLOBIN: 8.2 g/dL — AB (ref 13.0–17.0)
Hemoglobin: 8.2 g/dL — ABNORMAL LOW (ref 13.0–17.0)
POTASSIUM: 3.8 mmol/L (ref 3.5–5.1)
Potassium: 4.6 mmol/L (ref 3.5–5.1)
Potassium: 3.7 mmol/L (ref 3.5–5.1)
Potassium: 3.4 mmol/L — ABNORMAL LOW (ref 3.5–5.1)
Potassium: 4.2 mmol/L (ref 3.5–5.1)
SODIUM: 137 mmol/L (ref 135–145)
Sodium: 132 mmol/L — ABNORMAL LOW (ref 135–145)
Sodium: 133 mmol/L — ABNORMAL LOW (ref 135–145)
Sodium: 135 mmol/L (ref 135–145)
Sodium: 135 mmol/L (ref 135–145)
TCO2: 35 mmol/L (ref 0–100)
TCO2: 30 mmol/L (ref 0–100)
TCO2: 30 mmol/L (ref 0–100)
TCO2: 32 mmol/L (ref 0–100)
TCO2: 30 mmol/L (ref 0–100)

## 2016-05-30 LAB — POCT I-STAT 3, ART BLOOD GAS (G3+)
ACID-BASE EXCESS: 7 mmol/L — AB (ref 0.0–2.0)
ACID-BASE EXCESS: 6 mmol/L — AB (ref 0.0–2.0)
ACID-BASE EXCESS: 6 mmol/L — AB (ref 0.0–2.0)
Acid-Base Excess: 6 mmol/L — ABNORMAL HIGH (ref 0.0–2.0)
Acid-Base Excess: 7 mmol/L — ABNORMAL HIGH (ref 0.0–2.0)
BICARBONATE: 30.6 mmol/L — AB (ref 20.0–28.0)
Bicarbonate: 30.7 mmol/L — ABNORMAL HIGH (ref 20.0–28.0)
Bicarbonate: 30.5 mmol/L — ABNORMAL HIGH (ref 20.0–28.0)
Bicarbonate: 30.6 mmol/L — ABNORMAL HIGH (ref 20.0–28.0)
Bicarbonate: 30 mmol/L — ABNORMAL HIGH (ref 20.0–28.0)
O2 SAT: 100 %
O2 SAT: 100 %
O2 SAT: 100 %
O2 Saturation: 100 %
O2 Saturation: 100 %
PCO2 ART: 44.2 mmHg (ref 32.0–48.0)
PH ART: 7.489 — AB (ref 7.350–7.450)
PH ART: 7.448 (ref 7.350–7.450)
PO2 ART: 224 mmHg — AB (ref 83.0–108.0)
Patient temperature: 36.2
TCO2: 32 mmol/L (ref 0–100)
TCO2: 32 mmol/L (ref 0–100)
TCO2: 32 mmol/L (ref 0–100)
TCO2: 32 mmol/L (ref 0–100)
TCO2: 31 mmol/L (ref 0–100)
pCO2 arterial: 40.4 mmHg (ref 32.0–48.0)
pCO2 arterial: 38.2 mmHg (ref 32.0–48.0)
pCO2 arterial: 41.9 mmHg (ref 32.0–48.0)
pCO2 arterial: 41.2 mmHg (ref 32.0–48.0)
pH, Arterial: 7.508 — ABNORMAL HIGH (ref 7.350–7.450)
pH, Arterial: 7.472 — ABNORMAL HIGH (ref 7.350–7.450)
pH, Arterial: 7.472 — ABNORMAL HIGH (ref 7.350–7.450)
pO2, Arterial: 340 mmHg — ABNORMAL HIGH (ref 83.0–108.0)
pO2, Arterial: 397 mmHg — ABNORMAL HIGH (ref 83.0–108.0)
pO2, Arterial: 190 mmHg — ABNORMAL HIGH (ref 83.0–108.0)
pO2, Arterial: 221 mmHg — ABNORMAL HIGH (ref 83.0–108.0)

## 2016-05-30 LAB — POCT I-STAT 4, (NA,K, GLUC, HGB,HCT)
Glucose, Bld: 127 mg/dL — ABNORMAL HIGH (ref 65–99)
HEMATOCRIT: 24 % — AB (ref 39.0–52.0)
HEMOGLOBIN: 8.2 g/dL — AB (ref 13.0–17.0)
POTASSIUM: 3.5 mmol/L (ref 3.5–5.1)
Sodium: 138 mmol/L (ref 135–145)

## 2016-05-30 LAB — COOXEMETRY PANEL
CARBOXYHEMOGLOBIN: 1.7 % — AB (ref 0.5–1.5)
Carboxyhemoglobin: 1.2 % (ref 0.5–1.5)
Methemoglobin: 1 % (ref 0.0–1.5)
Methemoglobin: 1.2 % (ref 0.0–1.5)
O2 Saturation: 66.1 %
O2 Saturation: 58.5 %
Total hemoglobin: 12.3 g/dL (ref 12.0–16.0)
Total hemoglobin: 9 g/dL — ABNORMAL LOW (ref 12.0–16.0)

## 2016-05-30 LAB — BASIC METABOLIC PANEL
Anion gap: 6 (ref 5–15)
Anion gap: 7 (ref 5–15)
BUN: 11 mg/dL (ref 6–20)
BUN: 8 mg/dL (ref 6–20)
CHLORIDE: 95 mmol/L — AB (ref 101–111)
CO2: 30 mmol/L (ref 22–32)
CO2: 27 mmol/L (ref 22–32)
Calcium: 8.5 mg/dL — ABNORMAL LOW (ref 8.9–10.3)
Calcium: 7.9 mg/dL — ABNORMAL LOW (ref 8.9–10.3)
Chloride: 101 mmol/L (ref 101–111)
Creatinine, Ser: 0.67 mg/dL (ref 0.61–1.24)
Creatinine, Ser: 0.54 mg/dL — ABNORMAL LOW (ref 0.61–1.24)
GFR calc Af Amer: >60 mL/min (ref >60)
GFR calc non Af Amer: >60 mL/min (ref >60)
GFR calc non Af Amer: >60 mL/min (ref >60)
Glucose, Bld: 169 mg/dL — ABNORMAL HIGH (ref 65–99)
Glucose, Bld: 119 mg/dL — ABNORMAL HIGH (ref 65–99)
POTASSIUM: 3.3 mmol/L — AB (ref 3.5–5.1)
Potassium: 3.3 mmol/L — ABNORMAL LOW (ref 3.5–5.1)
Sodium: 131 mmol/L — ABNORMAL LOW (ref 135–145)
Sodium: 135 mmol/L (ref 135–145)

## 2016-05-30 LAB — PLATELET COUNT: Platelets: 145 10*3/uL — ABNORMAL LOW (ref 150–400)

## 2016-05-30 LAB — MAGNESIUM
Magnesium: 1.7 mg/dL (ref 1.7–2.4)
Magnesium: 2.8 mg/dL — ABNORMAL HIGH (ref 1.7–2.4)

## 2016-05-30 LAB — DIC (DISSEMINATED INTRAVASCULAR COAGULATION)PANEL
D-Dimer, Quant: 1.13 ug/mL-FEU — ABNORMAL HIGH (ref 0.00–0.50)
Fibrinogen: 386 mg/dL (ref 210–475)
INR: 1.36
Platelets: 150 10*3/uL (ref 150–400)
Prothrombin Time: 16.9 seconds — ABNORMAL HIGH (ref 11.4–15.2)
Smear Review: NONE SEEN
aPTT: 35 seconds (ref 24–36)

## 2016-05-30 LAB — GLUCOSE, CAPILLARY
GLUCOSE-CAPILLARY: 150 mg/dL — AB (ref 65–99)
GLUCOSE-CAPILLARY: 116 mg/dL — AB (ref 65–99)
Glucose-Capillary: 119 mg/dL — ABNORMAL HIGH (ref 65–99)
Glucose-Capillary: 119 mg/dL — ABNORMAL HIGH (ref 65–99)
Glucose-Capillary: 120 mg/dL — ABNORMAL HIGH (ref 65–99)
Glucose-Capillary: 112 mg/dL — ABNORMAL HIGH (ref 65–99)
Glucose-Capillary: 102 mg/dL — ABNORMAL HIGH (ref 65–99)
Glucose-Capillary: 106 mg/dL — ABNORMAL HIGH (ref 65–99)

## 2016-05-30 LAB — CREATININE, SERUM
Creatinine, Ser: 0.58 mg/dL — ABNORMAL LOW (ref 0.61–1.24)
GFR calc Af Amer: >60 mL/min (ref >60)
GFR calc non Af Amer: >60 mL/min (ref >60)

## 2016-05-30 LAB — PROTIME-INR
INR: 1.38
Prothrombin Time: 17.1 seconds — ABNORMAL HIGH (ref 11.4–15.2)

## 2016-05-30 LAB — HEMOGLOBIN AND HEMATOCRIT, BLOOD
HCT: 27.9 % — ABNORMAL LOW (ref 39.0–52.0)
Hemoglobin: 9.3 g/dL — ABNORMAL LOW (ref 13.0–17.0)

## 2016-05-30 LAB — PREPARE RBC (CROSSMATCH)

## 2016-05-30 LAB — PLATELET INHIBITION P2Y12: Platelet Function  P2Y12: 163 [PRU] — ABNORMAL LOW (ref 194–418)

## 2016-05-30 LAB — APTT: aPTT: 33 seconds (ref 24–36)

## 2016-05-30 SURGERY — INSERTION OF IMPLANTABLE LEFT VENTRICULAR ASSIST DEVICE
Anesthesia: General | Site: Chest

## 2016-05-30 MED ORDER — RIFAMPIN 300 MG PO CAPS
600.0000 mg | ORAL_CAPSULE | Freq: Once | ORAL | Status: AC
  Administered 2016-05-31: 600 mg via ORAL
  Filled 2016-05-30: qty 2

## 2016-05-30 MED ORDER — ASPIRIN EC 325 MG PO TBEC
325.0000 mg | DELAYED_RELEASE_TABLET | Freq: Every day | ORAL | Status: DC
  Administered 2016-05-31 – 2016-06-13 (×13): 325 mg via ORAL
  Filled 2016-05-30 (×15): qty 1

## 2016-05-30 MED ORDER — MILRINONE LACTATE IN DEXTROSE 20-5 MG/100ML-% IV SOLN
0.1250 ug/kg/min | INTRAVENOUS | Status: DC
  Administered 2016-05-30 – 2016-06-02 (×6): 0.25 ug/kg/min via INTRAVENOUS
  Administered 2016-06-03: 0.125 ug/kg/min via INTRAVENOUS
  Filled 2016-05-30 (×6): qty 100

## 2016-05-30 MED ORDER — MIDAZOLAM HCL 5 MG/5ML IJ SOLN
INTRAMUSCULAR | Status: DC | PRN
  Administered 2016-05-30: 3 mg via INTRAVENOUS
  Administered 2016-05-30: 1 mg via INTRAVENOUS
  Administered 2016-05-30 (×2): 2 mg via INTRAVENOUS

## 2016-05-30 MED ORDER — AMIODARONE HCL IN DEXTROSE 360-4.14 MG/200ML-% IV SOLN
30.0000 mg/h | INTRAVENOUS | Status: DC
  Administered 2016-05-30 (×2): 30 mg/h via INTRAVENOUS
  Filled 2016-05-30 (×2): qty 200

## 2016-05-30 MED ORDER — PROPOFOL 10 MG/ML IV BOLUS
INTRAVENOUS | Status: AC
  Filled 2016-05-30: qty 20

## 2016-05-30 MED ORDER — LACTATED RINGERS IV SOLN
INTRAVENOUS | Status: DC | PRN
  Administered 2016-05-30 (×2): via INTRAVENOUS

## 2016-05-30 MED ORDER — HEPARIN SODIUM (PORCINE) 1000 UNIT/ML IJ SOLN
INTRAMUSCULAR | Status: AC
Start: 2016-05-30 — End: 2016-05-30
  Filled 2016-05-30: qty 1

## 2016-05-30 MED ORDER — ACETAMINOPHEN 500 MG PO TABS
1000.0000 mg | ORAL_TABLET | Freq: Four times a day (QID) | ORAL | Status: AC
  Administered 2016-05-31 – 2016-06-04 (×18): 1000 mg via ORAL
  Filled 2016-05-30 (×15): qty 2

## 2016-05-30 MED ORDER — SUCCINYLCHOLINE CHLORIDE 20 MG/ML IJ SOLN
INTRAMUSCULAR | Status: DC | PRN
  Administered 2016-05-30: 100 mg via INTRAVENOUS

## 2016-05-30 MED ORDER — NOREPINEPHRINE BITARTRATE 1 MG/ML IV SOLN
0.0000 ug/min | INTRAVENOUS | Status: DC
  Administered 2016-05-30: 6 ug/min via INTRAVENOUS
  Administered 2016-05-30: 5 ug/min via INTRAVENOUS
  Filled 2016-05-30: qty 4

## 2016-05-30 MED ORDER — PROTAMINE SULFATE 10 MG/ML IV SOLN
INTRAVENOUS | Status: DC | PRN
  Administered 2016-05-30: 10 mg via INTRAVENOUS
  Administered 2016-05-30: 230 mg via INTRAVENOUS

## 2016-05-30 MED ORDER — ASPIRIN 300 MG RE SUPP: 300.0000 mg | Freq: Every day | RECTAL | Status: DC

## 2016-05-30 MED ORDER — MAGNESIUM SULFATE 4 GM/100ML IV SOLN
4.0000 g | Freq: Once | INTRAVENOUS | Status: AC
  Administered 2016-05-30: 4 g via INTRAVENOUS
  Filled 2016-05-30: qty 100

## 2016-05-30 MED ORDER — FAMOTIDINE IN NACL 20-0.9 MG/50ML-% IV SOLN
20.0000 mg | Freq: Two times a day (BID) | INTRAVENOUS | Status: AC
  Administered 2016-05-30 (×2): 20 mg via INTRAVENOUS
  Filled 2016-05-30: qty 50

## 2016-05-30 MED ORDER — CHLORHEXIDINE GLUCONATE 0.12 % MT SOLN
15.0000 mL | OROMUCOSAL | Status: AC
  Administered 2016-05-30: 15 mL via OROMUCOSAL

## 2016-05-30 MED ORDER — BISACODYL 5 MG PO TBEC
10.0000 mg | DELAYED_RELEASE_TABLET | Freq: Every day | ORAL | Status: DC
  Administered 2016-05-31 – 2016-06-09 (×3): 10 mg via ORAL
  Filled 2016-05-30 (×8): qty 2

## 2016-05-30 MED ORDER — ALBUMIN HUMAN 5 % IV SOLN
250.0000 mL | INTRAVENOUS | Status: AC | PRN
  Administered 2016-05-30 – 2016-05-31 (×4): 250 mL via INTRAVENOUS
  Filled 2016-05-30: qty 250

## 2016-05-30 MED ORDER — ORAL CARE MOUTH RINSE
15.0000 mL | OROMUCOSAL | Status: DC
  Administered 2016-05-30 – 2016-05-31 (×5): 15 mL via OROMUCOSAL

## 2016-05-30 MED ORDER — INSULIN REGULAR BOLUS VIA INFUSION
0.0000 [IU] | Freq: Three times a day (TID) | INTRAVENOUS | Status: DC
  Filled 2016-05-30: qty 10

## 2016-05-30 MED ORDER — LACTATED RINGERS IV SOLN: 500.0000 mL | Freq: Once | INTRAVENOUS | Status: DC | PRN

## 2016-05-30 MED ORDER — SODIUM CHLORIDE 0.9 % IV SOLN: Freq: Once | INTRAVENOUS | Status: DC

## 2016-05-30 MED ORDER — PHENYLEPHRINE HCL 10 MG/ML IJ SOLN
INTRAMUSCULAR | Status: DC | PRN
  Administered 2016-05-30: 80 ug via INTRAVENOUS

## 2016-05-30 MED ORDER — SODIUM CHLORIDE 0.9 % IV SOLN: 250.0000 mL | INTRAVENOUS | Status: DC

## 2016-05-30 MED ORDER — MIDAZOLAM HCL 2 MG/2ML IJ SOLN
2.0000 mg | INTRAMUSCULAR | Status: DC | PRN
  Administered 2016-05-30 – 2016-05-31 (×3): 2 mg via INTRAVENOUS
  Filled 2016-05-30 (×3): qty 2

## 2016-05-30 MED ORDER — BISACODYL 10 MG RE SUPP: 10.0000 mg | Freq: Every day | RECTAL | Status: DC

## 2016-05-30 MED ORDER — NOREPINEPHRINE BITARTRATE 1 MG/ML IV SOLN
0.0000 ug/min | INTRAVENOUS | Status: DC
  Filled 2016-05-30: qty 4

## 2016-05-30 MED ORDER — EPINEPHRINE PF 1 MG/ML IJ SOLN
0.0000 ug/min | INTRAVENOUS | Status: DC
  Filled 2016-05-30: qty 4

## 2016-05-30 MED ORDER — MIDAZOLAM HCL 10 MG/2ML IJ SOLN
INTRAMUSCULAR | Status: AC
  Filled 2016-05-30: qty 2

## 2016-05-30 MED ORDER — SODIUM CHLORIDE 0.9 % IR SOLN
Status: DC | PRN
  Administered 2016-05-30: 6000 mL

## 2016-05-30 MED ORDER — DOCUSATE SODIUM 100 MG PO CAPS
200.0000 mg | ORAL_CAPSULE | Freq: Every day | ORAL | Status: DC
  Administered 2016-05-31 – 2016-06-09 (×4): 200 mg via ORAL
  Filled 2016-05-30 (×9): qty 2

## 2016-05-30 MED ORDER — METOCLOPRAMIDE HCL 5 MG/ML IJ SOLN
10.0000 mg | Freq: Four times a day (QID) | INTRAMUSCULAR | Status: DC
  Administered 2016-05-30 – 2016-06-04 (×15): 10 mg via INTRAVENOUS
  Filled 2016-05-30 (×15): qty 2

## 2016-05-30 MED ORDER — ASPIRIN 81 MG PO CHEW
324.0000 mg | CHEWABLE_TABLET | Freq: Every day | ORAL | Status: DC
  Administered 2016-06-11: 81 mg
  Filled 2016-05-30 (×2): qty 4

## 2016-05-30 MED ORDER — ACETAMINOPHEN 650 MG RE SUPP
650.0000 mg | Freq: Once | RECTAL | Status: AC
  Administered 2016-05-30: 650 mg via RECTAL

## 2016-05-30 MED ORDER — OXYCODONE HCL 5 MG PO TABS
5.0000 mg | ORAL_TABLET | ORAL | Status: DC | PRN
  Administered 2016-05-31: 10 mg via ORAL
  Filled 2016-05-30: qty 2
  Filled 2016-05-30: qty 1

## 2016-05-30 MED ORDER — SODIUM CHLORIDE 0.9 % IV SOLN
INTRAVENOUS | Status: DC
  Administered 2016-05-30: 20 mL via INTRAVENOUS

## 2016-05-30 MED ORDER — ROCURONIUM BROMIDE 10 MG/ML (PF) SYRINGE
PREFILLED_SYRINGE | INTRAVENOUS | Status: AC
  Filled 2016-05-30: qty 15

## 2016-05-30 MED ORDER — CHLORHEXIDINE GLUCONATE 0.12% ORAL RINSE (MEDLINE KIT)
15.0000 mL | Freq: Two times a day (BID) | OROMUCOSAL | Status: DC
  Administered 2016-05-30 – 2016-05-31 (×2): 15 mL via OROMUCOSAL

## 2016-05-30 MED ORDER — FLUCONAZOLE IN SODIUM CHLORIDE 400-0.9 MG/200ML-% IV SOLN
400.0000 mg | Freq: Once | INTRAVENOUS | Status: AC
  Administered 2016-05-31: 400 mg via INTRAVENOUS
  Filled 2016-05-30: qty 200

## 2016-05-30 MED ORDER — LACTATED RINGERS IV SOLN
INTRAVENOUS | Status: DC
  Administered 2016-05-30: 14:00:00 via INTRAVENOUS

## 2016-05-30 MED ORDER — POTASSIUM CHLORIDE 10 MEQ/50ML IV SOLN
10.0000 meq | INTRAVENOUS | Status: AC
  Administered 2016-05-30 (×3): 10 meq via INTRAVENOUS

## 2016-05-30 MED ORDER — DEXMEDETOMIDINE HCL IN NACL 400 MCG/100ML IV SOLN: 0.1000 ug/kg/h | INTRAVENOUS | Status: DC

## 2016-05-30 MED ORDER — VANCOMYCIN HCL IN DEXTROSE 1-5 GM/200ML-% IV SOLN
1000.0000 mg | Freq: Two times a day (BID) | INTRAVENOUS | Status: AC
  Administered 2016-05-30 – 2016-05-31 (×3): 1000 mg via INTRAVENOUS
  Filled 2016-05-30 (×3): qty 200

## 2016-05-30 MED ORDER — FENTANYL CITRATE (PF) 250 MCG/5ML IJ SOLN
INTRAMUSCULAR | Status: AC
  Filled 2016-05-30: qty 25

## 2016-05-30 MED ORDER — PANTOPRAZOLE SODIUM 40 MG PO TBEC
40.0000 mg | DELAYED_RELEASE_TABLET | Freq: Every day | ORAL | Status: DC
  Administered 2016-06-01 – 2016-06-13 (×13): 40 mg via ORAL
  Filled 2016-05-30 (×13): qty 1

## 2016-05-30 MED ORDER — SODIUM CHLORIDE 0.9 % IV SOLN
0.1000 ug/kg/h | INTRAVENOUS | Status: DC
  Administered 2016-05-30 (×2): 0.7 ug/kg/h via INTRAVENOUS
  Filled 2016-05-30: qty 2

## 2016-05-30 MED ORDER — SODIUM CHLORIDE 0.9 % IV SOLN: INTRAVENOUS | Status: DC

## 2016-05-30 MED ORDER — PROTAMINE SULFATE 10 MG/ML IV SOLN
INTRAVENOUS | Status: AC
  Filled 2016-05-30: qty 25

## 2016-05-30 MED ORDER — TRAMADOL HCL 50 MG PO TABS: 50.0000 mg | ORAL_TABLET | ORAL | Status: DC | PRN

## 2016-05-30 MED ORDER — SODIUM CHLORIDE 0.9 % IJ SOLN
INTRAMUSCULAR | Status: DC | PRN
  Administered 2016-05-30 (×3): 4 mL via TOPICAL

## 2016-05-30 MED ORDER — SODIUM CHLORIDE 0.9% FLUSH
3.0000 mL | Freq: Two times a day (BID) | INTRAVENOUS | Status: DC
  Administered 2016-05-31 – 2016-06-12 (×12): 3 mL via INTRAVENOUS

## 2016-05-30 MED ORDER — HEPARIN SODIUM (PORCINE) 1000 UNIT/ML IJ SOLN
INTRAMUSCULAR | Status: DC | PRN
  Administered 2016-05-30: 26000 [IU] via INTRAVENOUS

## 2016-05-30 MED ORDER — POTASSIUM CHLORIDE CRYS ER 20 MEQ PO TBCR
40.0000 meq | EXTENDED_RELEASE_TABLET | Freq: Once | ORAL | Status: AC
Start: 2016-05-30 — End: 2016-05-30
  Administered 2016-05-30: 40 meq via ORAL
  Filled 2016-05-30: qty 2

## 2016-05-30 MED ORDER — LIDOCAINE 2% (20 MG/ML) 5 ML SYRINGE
INTRAMUSCULAR | Status: DC | PRN
  Administered 2016-05-30: 100 mg via INTRAVENOUS

## 2016-05-30 MED ORDER — DEXMEDETOMIDINE HCL IN NACL 400 MCG/100ML IV SOLN
0.4000 ug/kg/h | INTRAVENOUS | Status: DC
  Administered 2016-05-30 – 2016-05-31 (×2): 0.7 ug/kg/h via INTRAVENOUS
  Filled 2016-05-30 (×2): qty 100

## 2016-05-30 MED ORDER — FLUCONAZOLE IN SODIUM CHLORIDE 400-0.9 MG/200ML-% IV SOLN: 400.0000 mg | INTRAVENOUS | Status: DC

## 2016-05-30 MED ORDER — VANCOMYCIN HCL 1000 MG IV SOLR
INTRAVENOUS | Status: AC
  Filled 2016-05-30: qty 2000

## 2016-05-30 MED ORDER — ONDANSETRON HCL 4 MG/2ML IJ SOLN: 4.0000 mg | Freq: Four times a day (QID) | INTRAMUSCULAR | Status: DC | PRN

## 2016-05-30 MED ORDER — SODIUM CHLORIDE 0.9% FLUSH: 3.0000 mL | INTRAVENOUS | Status: DC | PRN

## 2016-05-30 MED ORDER — LACTATED RINGERS IV SOLN
INTRAVENOUS | Status: DC | PRN
  Administered 2016-05-30: 07:00:00 via INTRAVENOUS

## 2016-05-30 MED ORDER — MORPHINE SULFATE (PF) 4 MG/ML IV SOLN
1.0000 mg | INTRAVENOUS | Status: AC | PRN
  Administered 2016-05-30 (×4): 4 mg via INTRAVENOUS
  Filled 2016-05-30 (×5): qty 1

## 2016-05-30 MED ORDER — ACETAMINOPHEN 160 MG/5ML PO SOLN
1000.0000 mg | Freq: Four times a day (QID) | ORAL | Status: AC
  Administered 2016-05-30 – 2016-05-31 (×2): 1000 mg
  Filled 2016-05-30 (×2): qty 40.6

## 2016-05-30 MED ORDER — EPINEPHRINE PF 1 MG/ML IJ SOLN: 0.0000 ug/min | INTRAVENOUS | Status: DC

## 2016-05-30 MED ORDER — SODIUM CHLORIDE 0.9 % IV SOLN
INTRAVENOUS | Status: DC
  Administered 2016-05-30: 20:00:00 via INTRAVENOUS
  Filled 2016-05-30: qty 1

## 2016-05-30 MED ORDER — ACETAMINOPHEN 160 MG/5ML PO SOLN: 650.0000 mg | Freq: Once | ORAL | Status: AC

## 2016-05-30 MED ORDER — FENTANYL CITRATE (PF) 100 MCG/2ML IJ SOLN: 25.0000 ug | INTRAMUSCULAR | Status: DC | PRN

## 2016-05-30 MED ORDER — ETOMIDATE 2 MG/ML IV SOLN
INTRAVENOUS | Status: DC | PRN
  Administered 2016-05-30: 8 mg via INTRAVENOUS

## 2016-05-30 MED ORDER — FENTANYL CITRATE (PF) 250 MCG/5ML IJ SOLN
INTRAMUSCULAR | Status: DC | PRN
  Administered 2016-05-30: 150 ug via INTRAVENOUS
  Administered 2016-05-30: 50 ug via INTRAVENOUS
  Administered 2016-05-30: 150 ug via INTRAVENOUS
  Administered 2016-05-30: 100 ug via INTRAVENOUS
  Administered 2016-05-30: 50 ug via INTRAVENOUS
  Administered 2016-05-30: 250 ug via INTRAVENOUS
  Administered 2016-05-30: 100 ug via INTRAVENOUS
  Administered 2016-05-30: 400 ug via INTRAVENOUS

## 2016-05-30 MED ORDER — DEXTROSE 5 % IV SOLN
1.5000 g | Freq: Two times a day (BID) | INTRAVENOUS | Status: AC
  Administered 2016-05-30 – 2016-06-01 (×4): 1.5 g via INTRAVENOUS
  Filled 2016-05-30 (×4): qty 1.5

## 2016-05-30 MED ORDER — SODIUM CHLORIDE 0.9 % IV SOLN
20.0000 ug | INTRAVENOUS | Status: AC
  Administered 2016-05-30: 20 ug via INTRAVENOUS
  Filled 2016-05-30: qty 5

## 2016-05-30 MED ORDER — ETOMIDATE 2 MG/ML IV SOLN
INTRAVENOUS | Status: AC
  Filled 2016-05-30: qty 10

## 2016-05-30 MED ORDER — SODIUM CHLORIDE 0.45 % IV SOLN
INTRAVENOUS | Status: DC | PRN
  Administered 2016-05-30: 14:00:00 via INTRAVENOUS

## 2016-05-30 MED ORDER — HEMOSTATIC AGENTS (NO CHARGE) OPTIME
TOPICAL | Status: DC | PRN
  Administered 2016-05-30 (×3): 1 via TOPICAL

## 2016-05-30 MED ORDER — ROCURONIUM BROMIDE 10 MG/ML (PF) SYRINGE
PREFILLED_SYRINGE | INTRAVENOUS | Status: DC | PRN
  Administered 2016-05-30: 50 mg via INTRAVENOUS
  Administered 2016-05-30: 20 mg via INTRAVENOUS
  Administered 2016-05-30 (×2): 50 mg via INTRAVENOUS

## 2016-05-30 MED ORDER — ALBUMIN HUMAN 5 % IV SOLN
INTRAVENOUS | Status: DC | PRN
  Administered 2016-05-30: 12:00:00 via INTRAVENOUS

## 2016-05-30 MED FILL — Lidocaine HCl IV Inj 20 MG/ML: INTRAVENOUS | Qty: 5 | Status: AC

## 2016-05-30 MED FILL — Sodium Chloride IV Soln 0.9%: INTRAVENOUS | Qty: 2000 | Status: AC

## 2016-05-30 MED FILL — Potassium Chloride Inj 2 mEq/ML: INTRAVENOUS | Qty: 10 | Status: AC

## 2016-05-30 MED FILL — Mannitol IV Soln 20%: INTRAVENOUS | Qty: 500 | Status: AC

## 2016-05-30 MED FILL — Sodium Bicarbonate IV Soln 8.4%: INTRAVENOUS | Qty: 50 | Status: AC

## 2016-05-30 MED FILL — Heparin Sodium (Porcine) Inj 1000 Unit/ML: INTRAMUSCULAR | Qty: 30 | Status: AC

## 2016-05-30 MED FILL — Heparin Sodium (Porcine) Inj 1000 Unit/ML: INTRAMUSCULAR | Qty: 10 | Status: AC

## 2016-05-30 MED FILL — Magnesium Sulfate Inj 50%: INTRAMUSCULAR | Qty: 10 | Status: AC

## 2016-05-30 MED FILL — Electrolyte-R (PH 7.4) Solution: INTRAVENOUS | Qty: 4000 | Status: AC

## 2016-05-30 MED FILL — Calcium Chloride Inj 10%: INTRAVENOUS | Qty: 10 | Status: AC

## 2016-05-30 SURGICAL SUPPLY — 129 items
ADAPTER CARDIO PERF ANTE/RETRO (ADAPTER) ×2 IMPLANT
ADAPTER DLP PERFUSION .25INX2I (MISCELLANEOUS) ×4 IMPLANT
ADPR CRDPLG .25X.64 STRL (MISCELLANEOUS) ×2
ADPR PRFSN 84XANTGRD RTRGD (ADAPTER) ×2
AGENT HMST MTR 8 SURGIFLO (HEMOSTASIS) ×2
ANTEGRADE CPLG (MISCELLANEOUS) IMPLANT
ATTRACTOMAT 16X20 MAGNETIC DRP (DRAPES) ×2 IMPLANT
BAG DECANTER FOR FLEXI CONT (MISCELLANEOUS) ×12 IMPLANT
BLADE STERNUM SYSTEM 6 (BLADE) ×4 IMPLANT
BLADE SURG 11 STRL SS (BLADE) ×2 IMPLANT
BLADE SURG 12 STRL SS (BLADE) ×4 IMPLANT
BLADE SURG 15 STRL LF DISP TIS (BLADE) IMPLANT
BLADE SURG 15 STRL SS (BLADE)
CANISTER SUCT 3000ML PPV (MISCELLANEOUS) ×4 IMPLANT
CANNULA ARTERIAL NVNT 3/8 20FR (MISCELLANEOUS) ×2 IMPLANT
CANNULA SUMP PERICARDIAL (CANNULA) ×2 IMPLANT
CANNULA VENOUS LOW PROF 34X46 (CANNULA) ×2 IMPLANT
CATH CPB KIT VANTRIGT (MISCELLANEOUS) ×2 IMPLANT
CATH FOLEY 2WAY SLVR  5CC 14FR (CATHETERS) ×2
CATH FOLEY 2WAY SLVR 5CC 14FR (CATHETERS) ×2 IMPLANT
CATH HEART VENT LEFT (CATHETERS) IMPLANT
CATH HYDRAGLIDE XL THORACIC (CATHETERS) ×4 IMPLANT
CATH ROBINSON RED A/P 18FR (CATHETERS) ×8 IMPLANT
CATH THORACIC 28FR (CATHETERS) IMPLANT
CATH THORACIC 36FR RT ANG (CATHETERS) IMPLANT
CHLORAPREP W/TINT 26ML (MISCELLANEOUS) ×4 IMPLANT
CONN ST 1/4X3/8  BEN (MISCELLANEOUS)
CONN ST 1/4X3/8 BEN (MISCELLANEOUS) IMPLANT
CONT SPEC 4OZ CLIKSEAL STRL BL (MISCELLANEOUS) ×2 IMPLANT
COVER SURGICAL LIGHT HANDLE (MISCELLANEOUS) ×2 IMPLANT
CRADLE DONUT ADULT HEAD (MISCELLANEOUS) ×4 IMPLANT
DRAIN CHANNEL 28F RND 3/8 FF (WOUND CARE) IMPLANT
DRAIN CHANNEL 32F RND 10.7 FF (WOUND CARE) IMPLANT
DRAPE BILATERAL SPLIT (DRAPES) ×4 IMPLANT
DRAPE CV SPLIT W-CLR ANES SCRN (DRAPES) ×4 IMPLANT
DRAPE INCISE IOBAN 66X45 STRL (DRAPES) ×4 IMPLANT
DRAPE SLUSH/WARMER DISC (DRAPES) ×4 IMPLANT
DRSG AQUACEL AG ADV 3.5X14 (GAUZE/BANDAGES/DRESSINGS) ×4 IMPLANT
ELECT BLADE 4.0 EZ CLEAN MEGAD (MISCELLANEOUS) ×4
ELECT BLADE 6.5 EXT (BLADE) ×4 IMPLANT
ELECT CAUTERY BLADE 6.4 (BLADE) ×4 IMPLANT
ELECT REM PT RETURN 9FT ADLT (ELECTROSURGICAL) ×4
ELECTRODE BLDE 4.0 EZ CLN MEGD (MISCELLANEOUS) ×2 IMPLANT
ELECTRODE REM PT RTRN 9FT ADLT (ELECTROSURGICAL) ×2 IMPLANT
FELT TEFLON 1X6 (MISCELLANEOUS) ×6 IMPLANT
FELT TEFLON 6X6 (MISCELLANEOUS) ×2 IMPLANT
GAUZE SPONGE 4X4 12PLY STRL (GAUZE/BANDAGES/DRESSINGS) ×8 IMPLANT
GAUZE SPONGE 4X4 12PLY STRL LF (GAUZE/BANDAGES/DRESSINGS) ×2 IMPLANT
GLOVE BIO SURGEON STRL SZ 6.5 (GLOVE) ×4 IMPLANT
GLOVE BIO SURGEON STRL SZ7.5 (GLOVE) ×12 IMPLANT
GLOVE BIO SURGEON STRL SZ8.5 (GLOVE) ×2 IMPLANT
GLOVE BIO SURGEONS STRL SZ 6.5 (GLOVE) ×4
GLOVE BIOGEL PI IND STRL 6.5 (GLOVE) IMPLANT
GLOVE BIOGEL PI IND STRL 7.0 (GLOVE) IMPLANT
GLOVE BIOGEL PI INDICATOR 6.5 (GLOVE) ×2
GLOVE BIOGEL PI INDICATOR 7.0 (GLOVE) ×2
GOWN STRL REUS W/ TWL LRG LVL3 (GOWN DISPOSABLE) ×8 IMPLANT
GOWN STRL REUS W/ TWL XL LVL3 (GOWN DISPOSABLE) ×4 IMPLANT
GOWN STRL REUS W/TWL LRG LVL3 (GOWN DISPOSABLE) ×32
GOWN STRL REUS W/TWL XL LVL3 (GOWN DISPOSABLE) ×4
HEMOSTAT POWDER SURGIFOAM 1G (HEMOSTASIS) ×14 IMPLANT
HEMOSTAT SURGICEL 2X14 (HEMOSTASIS) ×2 IMPLANT
HVAD PUMP SURGICAL TOOLS ×1 IMPLANT
INSERT FOGARTY XLG (MISCELLANEOUS) IMPLANT
IV D5W 250ML (IV SOLUTION) ×4 IMPLANT
KIT BASIN OR (CUSTOM PROCEDURE TRAY) ×4 IMPLANT
KIT HVAD HEARTWARE W-CTRL (Prosthesis & Implant Heart) ×1 IMPLANT
KIT ROOM TURNOVER OR (KITS) ×4 IMPLANT
KIT SUCTION CATH 14FR (SUCTIONS) ×4 IMPLANT
LEAD PACING MYOCARDI (MISCELLANEOUS) ×2 IMPLANT
LINE VENT (MISCELLANEOUS) ×2 IMPLANT
MEMBRANE PERI 15X20 (Vascular Products) ×2 IMPLANT
NDL SUT 1 .5 CRC FRENCH EYE (NEEDLE) IMPLANT
NEEDLE FRENCH EYE (NEEDLE) ×4
NS IRRIG 1000ML POUR BTL (IV SOLUTION) ×22 IMPLANT
PACK OPEN HEART (CUSTOM PROCEDURE TRAY) ×4 IMPLANT
PAD ARMBOARD 7.5X6 YLW CONV (MISCELLANEOUS) ×8 IMPLANT
PAD DEFIB R2 (MISCELLANEOUS) ×4 IMPLANT
POWDER SURGICEL 3.0 GRAM (HEMOSTASIS) ×2 IMPLANT
PUNCH AORTIC ROTATE 4.5MM 8IN (MISCELLANEOUS) ×2 IMPLANT
SEALANT SURG COSEAL 4ML (VASCULAR PRODUCTS) ×2 IMPLANT
SEALANT SURG COSEAL 8ML (VASCULAR PRODUCTS) ×4 IMPLANT
SET CARDIOPLEGIA MPS 5001102 (MISCELLANEOUS) ×2 IMPLANT
SHEATH AVANTI 11CM 5FR (MISCELLANEOUS) IMPLANT
SPOGE SURGIFLO 8M (HEMOSTASIS) ×2
SPONGE LAP 18X18 X RAY DECT (DISPOSABLE) ×2 IMPLANT
SPONGE SURGIFLO 8M (HEMOSTASIS) IMPLANT
STOPCOCK 4 WAY LG BORE MALE ST (IV SETS) ×4 IMPLANT
SUCKER INTRACARDIAC WEIGHTED (SUCKER) ×4 IMPLANT
SUT ETHIBOND 2 0 SH (SUTURE) ×16
SUT ETHIBOND 2 0 SH 36X2 (SUTURE) ×10 IMPLANT
SUT ETHIBOND 5 LR DA (SUTURE) ×4 IMPLANT
SUT ETHIBOND NAB MH 2-0 36IN (SUTURE) ×52 IMPLANT
SUT PROLENE 3 0 RB 1 (SUTURE) IMPLANT
SUT PROLENE 3 0 SH DA (SUTURE) ×8 IMPLANT
SUT PROLENE 4 0 RB 1 (SUTURE) ×12
SUT PROLENE 4-0 RB1 .5 CRCL 36 (SUTURE) ×8 IMPLANT
SUT PROLENE 5 0 C 1 36 (SUTURE) ×2 IMPLANT
SUT PROLENE 5 0 C1 (SUTURE) ×4 IMPLANT
SUT SILK  1 MH (SUTURE) ×10
SUT SILK 1 MH (SUTURE) ×8 IMPLANT
SUT SILK 1 TIES 10X30 (SUTURE) ×4 IMPLANT
SUT SILK 2 0 SH CR/8 (SUTURE) ×10 IMPLANT
SUT SILK 3 0 SH CR/8 (SUTURE) ×2 IMPLANT
SUT STEEL 6MS V (SUTURE) ×4 IMPLANT
SUT STEEL SZ 6 DBL 3X14 BALL (SUTURE) ×2 IMPLANT
SUT TEM PAC WIRE 2 0 SH (SUTURE) ×2 IMPLANT
SUT VIC AB 1 CTX 36 (SUTURE) ×16
SUT VIC AB 1 CTX36XBRD ANBCTR (SUTURE) ×4 IMPLANT
SUT VIC AB 2-0 CTX 27 (SUTURE) ×8 IMPLANT
SUT VIC AB 3-0 SH 8-18 (SUTURE) ×4 IMPLANT
SUT VIC AB 3-0 X1 27 (SUTURE) ×8 IMPLANT
SUT VICRYL 2 TP 1 (SUTURE) IMPLANT
SYR 50ML LL SCALE MARK (SYRINGE) ×2 IMPLANT
SYSTEM SAHARA CHEST DRAIN ATS (WOUND CARE) ×4 IMPLANT
TAPE CLOTH SURG 4X10 WHT LF (GAUZE/BANDAGES/DRESSINGS) ×2 IMPLANT
TAPE PAPER 2X10 WHT MICROPORE (GAUZE/BANDAGES/DRESSINGS) ×2 IMPLANT
TOWEL OR 17X24 6PK STRL BLUE (TOWEL DISPOSABLE) ×8 IMPLANT
TOWEL OR 17X26 10 PK STRL BLUE (TOWEL DISPOSABLE) ×8 IMPLANT
TRAY CATH LUMEN 1 20CM STRL (SET/KITS/TRAYS/PACK) ×6 IMPLANT
TRAY FOLEY IC TEMP SENS 16FR (CATHETERS) ×2 IMPLANT
TUBE CONNECTING 12'X1/4 (SUCTIONS) ×1
TUBE CONNECTING 12X1/4 (SUCTIONS) ×3 IMPLANT
TUBING ART PRESS 48 MALE/FEM (TUBING) ×4 IMPLANT
UNDERPAD 30X30 (UNDERPADS AND DIAPERS) ×4 IMPLANT
VENT LEFT HEART 12002 (CATHETERS)
WATER STERILE IRR 1000ML POUR (IV SOLUTION) ×8 IMPLANT
YANKAUER SUCT BULB TIP NO VENT (SUCTIONS) ×4 IMPLANT
graft ×2 IMPLANT

## 2016-05-30 NOTE — Progress Notes (Signed)
  Echocardiogram Echocardiogram Transesophageal has been performed.  [name], [name] 05/30/2016, 11:08 AM

## 2016-05-30 NOTE — Anesthesia Procedure Notes (Addendum)
Central Venous Catheter Insertion Performed by: Marcene DuosFITZGERALD, Alydia Gosser, anesthesiologist Start/End5/29/2018 7:00 AM, 05/30/2016 7:20 AM Patient location: Pre-op. Preanesthetic checklist: patient identified, IV checked, site marked, risks and benefits discussed, surgical consent, monitors and equipment checked, pre-op evaluation, timeout performed and anesthesia consent Position: Trendelenburg Lidocaine 1% used for infiltration and patient sedated Hand hygiene performed , maximum sterile barriers used  and Seldinger technique used Catheter size: 8.5 Fr Total catheter length 10. Central line and PA cath was placed.Sheath introducer Swan type:thermodilution PA Cath depth:50 Procedure performed using ultrasound guided technique. Ultrasound Notes:anatomy identified, needle tip was noted to be adjacent to the nerve/plexus identified, no ultrasound evidence of intravascular and/or intraneural injection and image(s) printed for medical record Attempts: 1 Following insertion, line sutured and dressing applied. Post procedure assessment: blood return through all ports, free fluid flow and no air  Patient tolerated the procedure well with no immediate complications.

## 2016-05-30 NOTE — Anesthesia Procedure Notes (Signed)
Central Venous Catheter Insertion Performed by: Marcene DuosFITZGERALD, Adrianne Shackleton, anesthesiologist Start/End5/29/2018 7:00 AM, 05/30/2016 7:20 AM Patient location: Pre-op. Preanesthetic checklist: patient identified, IV checked, site marked, risks and benefits discussed, surgical consent, monitors and equipment checked, pre-op evaluation, timeout performed and anesthesia consent Position: Trendelenburg Lidocaine 1% used for infiltration and patient sedated Hand hygiene performed , maximum sterile barriers used  and Seldinger technique used Catheter size: 8 Fr Total catheter length 16. Central line was placed.Double lumen Procedure performed using ultrasound guided technique. Ultrasound Notes:anatomy identified, needle tip was noted to be adjacent to the nerve/plexus identified, no ultrasound evidence of intravascular and/or intraneural injection and image(s) printed for medical record Attempts: 1 Following insertion, dressing applied, line sutured and Biopatch. Post procedure assessment: blood return through all ports  Patient tolerated the procedure well with no immediate complications.

## 2016-05-30 NOTE — Anesthesia Postprocedure Evaluation (Signed)
Anesthesia Post Note  Patient: Dalton RainwaterLarry D Brazier  Procedure(s) Performed: Procedure(s) (LRB): INSERTION OF IMPLANTABLE LEFT VENTRICULAR ASSIST DEVICE (N/A) TRANSESOPHAGEAL ECHOCARDIOGRAM (TEE) (N/A)  Patient location during evaluation: SICU Anesthesia Type: General Level of consciousness: sedated Pain management: pain level controlled Vital Signs Assessment: post-procedure vital signs reviewed and stable Respiratory status: patient remains intubated per anesthesia plan Cardiovascular status: stable Anesthetic complications: no       Last Vitals:  Vitals:   05/30/16 1430 05/30/16 1445  BP:    Pulse:    Resp: 10 12  Temp: 36.3 C 36.4 C    Last Pain:  Vitals:   05/30/16 0425  TempSrc: Oral  PainSc:                  Kennieth RadFitzgerald, Caley Ciaramitaro E

## 2016-05-30 NOTE — Progress Notes (Signed)
Dr. Harley HallmarkVaranasia notified about patient's Potassium 3.3 this am of surgery. Orders for 40 mEq PO to be given. Will implement orders.

## 2016-05-30 NOTE — OR Nursing (Signed)
11:45am - 20 minute call to SICU charge nurse 

## 2016-05-30 NOTE — Anesthesia Procedure Notes (Addendum)
Central Venous Catheter Insertion Performed by: Marcene DuosFITZGERALD, Shalandria Elsbernd, anesthesiologist Start/End5/29/2018 7:00 AM, 05/30/2016 7:20 AM Patient location: Pre-op. Preanesthetic checklist: patient identified, IV checked, site marked, risks and benefits discussed, surgical consent, monitors and equipment checked, pre-op evaluation, timeout performed and anesthesia consent Hand hygiene performed  and maximum sterile barriers used  PA cath was placed.Swan type:oximetry PA Cath depth:50 Procedure performed using ultrasound guided technique. Ultrasound Notes:anatomy identified, needle tip was noted to be adjacent to the nerve/plexus identified, no ultrasound evidence of intravascular and/or intraneural injection and image(s) printed for medical record Attempts: 1 Patient tolerated the procedure well with no immediate complications.

## 2016-05-30 NOTE — Anesthesia Procedure Notes (Signed)
Procedure Name: Intubation Date/Time: 05/30/2016 8:00 AM Performed by: Clearnce Sorrel Pre-anesthesia Checklist: Patient identified, Emergency Drugs available, Patient being monitored, Timeout performed and Suction available Patient Re-evaluated:Patient Re-evaluated prior to inductionPreoxygenation: Pre-oxygenation with 100% oxygen Intubation Type: IV induction Ventilation: Mask ventilation without difficulty and Oral airway inserted - appropriate to patient size Laryngoscope Size: Mac and 4 Grade View: Grade I Tube type: Subglottic suction tube Tube size: 8.0 mm Number of attempts: 1 Airway Equipment and Method: Stylet Placement Confirmation: ETT inserted through vocal cords under direct vision,  positive ETCO2 and breath sounds checked- equal and bilateral Secured at: 23 cm Tube secured with: Tape Dental Injury: Teeth and Oropharynx as per pre-operative assessment

## 2016-05-30 NOTE — Progress Notes (Signed)
Transported on Nitric 30 ppm and vent with OR team without complications.

## 2016-05-30 NOTE — Transfer of Care (Signed)
Immediate Anesthesia Transfer of Care Note  Patient: [name]  Procedure(s) Performed: Procedure(s) with comments: INSERTION OF IMPLANTABLE LEFT VENTRICULAR ASSIST DEVICE (N/A) - CIRC ARREST  NITRIC OXIDE TRANSESOPHAGEAL ECHOCARDIOGRAM (TEE) (N/A)  Patient Location: SICU  Anesthesia Type:General  Level of Consciousness: Patient remains intubated per anesthesia plan  Airway & Oxygen Therapy: Patient remains intubated per anesthesia plan  Post-op Assessment: Report given to RN and Post -op Vital signs reviewed and stable  Post vital signs: Reviewed and stable  Last Vitals:  Vitals:   05/30/16 0425 05/30/16 1301  BP: 117/72 (!) (P) 85/73  Pulse:  (P) 80  Resp: 20   Temp: 37.1 C     Last Pain:  Vitals:   05/30/16 0425  TempSrc: Oral  PainSc:          Complications: No apparent anesthesia complications

## 2016-05-30 NOTE — Progress Notes (Signed)
Patient ID: Dalton RainwaterLarry D Leggitt, male   DOB: Oct 23, 1955, 61 y.o.   MRN: [id number]  SICU Evening Rounds:   Hemodynamically stable, atrial paced at 88. MAP 83 CI = 2.3 CVP 7 on milrinone 0.25, levophed 6, NO. VAD parameters stable.  Has started to wake up on vent.   Urine output good  CT output low  CBC    Component Value Date/Time   WBC 13.3 (H) 05/30/2016 1300   RBC 2.72 (L) 05/30/2016 1300   HGB 8.2 (L) 05/30/2016 1303   HCT 24.0 (L) 05/30/2016 1303   PLT 150 05/30/2016 1415   MCV 87.1 05/30/2016 1300   MCH 28.7 05/30/2016 1300   MCHC 32.9 05/30/2016 1300   RDW 16.6 (H) 05/30/2016 1300   LYMPHSABS 0.9 05/25/2016 0435   MONOABS 0.8 05/25/2016 0435   EOSABS 0.4 05/25/2016 0435   BASOSABS 0.0 05/25/2016 0435     BMET    Component Value Date/Time   NA 138 05/30/2016 1303   K 3.5 05/30/2016 1303   CL 101 05/30/2016 1300   CO2 27 05/30/2016 1300   GLUCOSE 127 (H) 05/30/2016 1303   BUN 8 05/30/2016 1300   CREATININE 0.54 (L) 05/30/2016 1300   CREATININE 0.77 04/05/2015 0950   CALCIUM 7.9 (L) 05/30/2016 1300   GFRNONAA >60 05/30/2016 1300   GFRNONAA >89 01/12/2014 0848   GFRAA >60 05/30/2016 1300   GFRAA >89 01/12/2014 0848     A/P:  Stable postop course. Continue current plans. Wean NO starting at MN with plan to extubate tomorrow.

## 2016-05-30 NOTE — Progress Notes (Signed)
The patient was examined and preop studies reviewed. There has been no change from the prior exam and the patient is ready for surgery.  plan VAD implant on L Gowdy

## 2016-05-30 NOTE — OR Nursing (Signed)
11:00am - 45 minute call to SICU charge nurse

## 2016-05-30 NOTE — Progress Notes (Signed)
CSW met in the waiting room with patient's family. Patient's Mother, Father, Sister, Daughter and two other family members were present for support. CSW provided update during surgery and then assisted family to [facility] waiting area post surgery to visit patient at bedside. Family in good spirits and relived to have surgery over and begin the recovery process. CSW will continue to follow for supportive needs for patient and family throughout hospitalization. Raquel Sarna, [place], [place]

## 2016-05-30 NOTE — Brief Op Note (Signed)
05/24/2016 - 05/30/2016  1:17 PM  PATIENT:  [name]  61 y.o. male  PRE-OPERATIVE DIAGNOSIS:  ICM  POST-OPERATIVE DIAGNOSIS:  ICM  PROCEDURE:  Implantation of  HVAD LV Assist Device  SURGEON:  Surgeon(s) and Role:    Kerin Perna* Van Trigt, Amada Hallisey, MD - Primary    * Bartle, Payton DoughtyBryan K, MD - Assisting  PHYSICIAN ASSISTANT: none  ASSISTANTS: Rutherford NailM Irwin RNFA   ANESTHESIA:   general  EBL:  Total I/O In: 3991 [I.V.:2000; Blood:1491; IV Piggyback:500] Out: 1700 [Urine:600; Blood:1100]  BLOOD ADMINISTERED:2 units PLTS  DRAINS: 3 Chest Tube(s) in the R pleyra, ant mediastinum and VAD [pocket   LOCAL MEDICATIONS USED:  NONE  SPECIMEN:  Excision   LV apex DISPOSITION OF SPECIMEN:  PATHOLOGY  COUNTS:  YES  TOURNIQUET:  * No tourniquets in log *  DICTATION: .Dragon Dictation  PLAN OF CARE: return to ICU room  PATIENT DISPOSITION:  ICU - intubated and hemodynamically stable.   Delay start of Pharmacological VTE agent (>24hrs) due to surgical blood loss or risk of bleeding: yes

## 2016-05-30 NOTE — Progress Notes (Signed)
Admitted 5/23/18from clinic for heart failure decompensation.  Heartware LVAD implanted on 5/29/2018by Kathlee NationsPeter Van Trigt.   Vital signs: HR: 99 Doppler Pressure: 91 Automatic BP: 101/89 O2 Sat: 100 on vent Wt:91 kg   LVAD interrogation reveals:  Speed: 2800 Flow: 4.2 Power: 4 Peak /Trough: 4/6 Alarms: none  Events: none Suction alarm off Alarm settings: Low flow: 2.0 High Power: 6.0   Drive Line:  Daily.  Labs:  LDH trend:  INR trend: 1.36  Anticoagulation Plan: -INR Goal: 2-2.5 -ASA Dose: not yet started  Adverse Events on VAD: -08/2012>GIB  Plan/Recommendations:   1. Daily dressing changes. 2. Heartware reps at bedside and available for questions.    Carlton AdamSarah Taegan Haider RN, VAD Coordinator 24/7 pager [phone]

## 2016-05-31 ENCOUNTER — Encounter (HOSPITAL_COMMUNITY): Payer: Self-pay | Admitting: Cardiothoracic Surgery

## 2016-05-31 ENCOUNTER — Encounter (HOSPITAL_COMMUNITY): Payer: BLUE CROSS/BLUE SHIELD

## 2016-05-31 ENCOUNTER — Inpatient Hospital Stay (HOSPITAL_COMMUNITY): Payer: BLUE CROSS/BLUE SHIELD

## 2016-05-31 DIAGNOSIS — I5023 Acute on chronic systolic (congestive) heart failure: Secondary | ICD-10-CM

## 2016-05-31 DIAGNOSIS — Z95811 Presence of heart assist device: Secondary | ICD-10-CM

## 2016-05-31 LAB — GLUCOSE, CAPILLARY
GLUCOSE-CAPILLARY: 103 mg/dL — AB (ref 65–99)
GLUCOSE-CAPILLARY: 114 mg/dL — AB (ref 65–99)
GLUCOSE-CAPILLARY: 117 mg/dL — AB (ref 65–99)
GLUCOSE-CAPILLARY: 136 mg/dL — AB (ref 65–99)
GLUCOSE-CAPILLARY: 157 mg/dL — AB (ref 65–99)
GLUCOSE-CAPILLARY: 168 mg/dL — AB (ref 65–99)
Glucose-Capillary: 102 mg/dL — ABNORMAL HIGH (ref 65–99)
Glucose-Capillary: 104 mg/dL — ABNORMAL HIGH (ref 65–99)
Glucose-Capillary: 106 mg/dL — ABNORMAL HIGH (ref 65–99)
Glucose-Capillary: 107 mg/dL — ABNORMAL HIGH (ref 65–99)
Glucose-Capillary: 112 mg/dL — ABNORMAL HIGH (ref 65–99)
Glucose-Capillary: 119 mg/dL — ABNORMAL HIGH (ref 65–99)
Glucose-Capillary: 122 mg/dL — ABNORMAL HIGH (ref 65–99)
Glucose-Capillary: 132 mg/dL — ABNORMAL HIGH (ref 65–99)
Glucose-Capillary: 133 mg/dL — ABNORMAL HIGH (ref 65–99)
Glucose-Capillary: 139 mg/dL — ABNORMAL HIGH (ref 65–99)
Glucose-Capillary: 142 mg/dL — ABNORMAL HIGH (ref 65–99)
Glucose-Capillary: 158 mg/dL — ABNORMAL HIGH (ref 65–99)
Glucose-Capillary: 163 mg/dL — ABNORMAL HIGH (ref 65–99)
Glucose-Capillary: 92 mg/dL (ref 65–99)

## 2016-05-31 LAB — POCT I-STAT 3, ART BLOOD GAS (G3+)
ACID-BASE EXCESS: 6 mmol/L — AB (ref 0.0–2.0)
ACID-BASE EXCESS: 4 mmol/L — AB (ref 0.0–2.0)
ACID-BASE EXCESS: 5 mmol/L — AB (ref 0.0–2.0)
Acid-Base Excess: 4 mmol/L — ABNORMAL HIGH (ref 0.0–2.0)
BICARBONATE: 28.2 mmol/L — AB (ref 20.0–28.0)
Bicarbonate: 29.8 mmol/L — ABNORMAL HIGH (ref 20.0–28.0)
Bicarbonate: 28.3 mmol/L — ABNORMAL HIGH (ref 20.0–28.0)
Bicarbonate: 28.7 mmol/L — ABNORMAL HIGH (ref 20.0–28.0)
O2 SAT: 100 %
O2 SAT: 100 %
O2 SAT: 99 %
O2 Saturation: 100 %
PCO2 ART: 41.5 mmHg (ref 32.0–48.0)
PCO2 ART: 39.5 mmHg (ref 32.0–48.0)
PCO2 ART: 38.2 mmHg (ref 32.0–48.0)
PH ART: 7.465 — AB (ref 7.350–7.450)
PH ART: 7.451 — AB (ref 7.350–7.450)
PO2 ART: 175 mmHg — AB (ref 83.0–108.0)
PO2 ART: 151 mmHg — AB (ref 83.0–108.0)
Patient temperature: 37.3
Patient temperature: 36.9
Patient temperature: 99
TCO2: 31 mmol/L (ref 0–100)
TCO2: 29 mmol/L (ref 0–100)
TCO2: 30 mmol/L (ref 0–100)
TCO2: 30 mmol/L (ref 0–100)
pCO2 arterial: 40.4 mmHg (ref 32.0–48.0)
pH, Arterial: 7.463 — ABNORMAL HIGH (ref 7.350–7.450)
pH, Arterial: 7.485 — ABNORMAL HIGH (ref 7.350–7.450)
pO2, Arterial: 222 mmHg — ABNORMAL HIGH (ref 83.0–108.0)
pO2, Arterial: 184 mmHg — ABNORMAL HIGH (ref 83.0–108.0)

## 2016-05-31 LAB — PREPARE FRESH FROZEN PLASMA
UNIT DIVISION: 0
UNIT DIVISION: 0
Unit division: 0
Unit division: 0

## 2016-05-31 LAB — BPAM PLATELET PHERESIS
BLOOD PRODUCT EXPIRATION DATE: 201805312359
Blood Product Expiration Date: 201805302359
ISSUE DATE / TIME: 201805290924
ISSUE DATE / TIME: 201805291122
UNIT TYPE AND RH: 6200
Unit Type and Rh: 600

## 2016-05-31 LAB — BPAM FFP
BLOOD PRODUCT EXPIRATION DATE: 201805302359
BLOOD PRODUCT EXPIRATION DATE: 201805302359
BLOOD PRODUCT EXPIRATION DATE: 201805302359
Blood Product Expiration Date: 201805302359
ISSUE DATE / TIME: 201805290848
ISSUE DATE / TIME: 201805290848
ISSUE DATE / TIME: 201805290848
ISSUE DATE / TIME: 201805290848
UNIT TYPE AND RH: 6200
Unit Type and Rh: 600
Unit Type and Rh: 6200
Unit Type and Rh: 6200

## 2016-05-31 LAB — COMPREHENSIVE METABOLIC PANEL
ALT: 14 U/L — ABNORMAL LOW (ref 17–63)
AST: 49 U/L — ABNORMAL HIGH (ref 15–41)
Albumin: 2.9 g/dL — ABNORMAL LOW (ref 3.5–5.0)
Alkaline Phosphatase: 51 U/L (ref 38–126)
Anion gap: 7 (ref 5–15)
BUN: 8 mg/dL (ref 6–20)
CO2: 27 mmol/L (ref 22–32)
Calcium: 8 mg/dL — ABNORMAL LOW (ref 8.9–10.3)
Chloride: 101 mmol/L (ref 101–111)
Creatinine, Ser: 0.66 mg/dL (ref 0.61–1.24)
GFR calc Af Amer: >60 mL/min (ref >60)
GFR calc non Af Amer: >60 mL/min (ref >60)
Glucose, Bld: 108 mg/dL — ABNORMAL HIGH (ref 65–99)
Potassium: 4.1 mmol/L (ref 3.5–5.1)
Sodium: 135 mmol/L (ref 135–145)
Total Bilirubin: 1.7 mg/dL — ABNORMAL HIGH (ref 0.3–1.2)
Total Protein: 5.3 g/dL — ABNORMAL LOW (ref 6.5–8.1)

## 2016-05-31 LAB — CBC WITH DIFFERENTIAL/PLATELET
Basophils Absolute: 0 10*3/uL (ref 0.0–0.1)
Basophils Relative: 0 %
Eosinophils Absolute: 0.3 10*3/uL (ref 0.0–0.7)
Eosinophils Relative: 3 %
HCT: 26 % — ABNORMAL LOW (ref 39.0–52.0)
Hemoglobin: 8.6 g/dL — ABNORMAL LOW (ref 13.0–17.0)
Lymphocytes Relative: 6 %
Lymphs Abs: 0.6 10*3/uL — ABNORMAL LOW (ref 0.7–4.0)
MCH: 28.6 pg (ref 26.0–34.0)
MCHC: 33.1 g/dL (ref 30.0–36.0)
MCV: 86.4 fL (ref 78.0–100.0)
Monocytes Absolute: 1.2 10*3/uL — ABNORMAL HIGH (ref 0.1–1.0)
Monocytes Relative: 12 %
Neutro Abs: 8.4 10*3/uL — ABNORMAL HIGH (ref 1.7–7.7)
Neutrophils Relative %: 79 %
Platelets: 145 10*3/uL — ABNORMAL LOW (ref 150–400)
RBC: 3.01 MIL/uL — ABNORMAL LOW (ref 4.22–5.81)
RDW: 16.6 % — ABNORMAL HIGH (ref 11.5–15.5)
WBC: 10.6 10*3/uL — ABNORMAL HIGH (ref 4.0–10.5)

## 2016-05-31 LAB — BASIC METABOLIC PANEL
Anion gap: 6 (ref 5–15)
BUN: 8 mg/dL (ref 6–20)
CO2: 25 mmol/L (ref 22–32)
Calcium: 7.8 mg/dL — ABNORMAL LOW (ref 8.9–10.3)
Chloride: 95 mmol/L — ABNORMAL LOW (ref 101–111)
Creatinine, Ser: 0.73 mg/dL (ref 0.61–1.24)
GFR calc Af Amer: >60 mL/min (ref >60)
GFR calc non Af Amer: >60 mL/min (ref >60)
Glucose, Bld: 120 mg/dL — ABNORMAL HIGH (ref 65–99)
Potassium: 3.6 mmol/L (ref 3.5–5.1)
Sodium: 126 mmol/L — ABNORMAL LOW (ref 135–145)

## 2016-05-31 LAB — PREPARE PLATELET PHERESIS
Unit division: 0
Unit division: 0

## 2016-05-31 LAB — CBC
HCT: 23.9 % — ABNORMAL LOW (ref 39.0–52.0)
Hemoglobin: 7.8 g/dL — ABNORMAL LOW (ref 13.0–17.0)
MCH: 28.3 pg (ref 26.0–34.0)
MCHC: 32.6 g/dL (ref 30.0–36.0)
MCV: 86.6 fL (ref 78.0–100.0)
Platelets: 125 10*3/uL — ABNORMAL LOW (ref 150–400)
RBC: 2.76 MIL/uL — ABNORMAL LOW (ref 4.22–5.81)
RDW: 16.6 % — ABNORMAL HIGH (ref 11.5–15.5)
WBC: 9.8 10*3/uL (ref 4.0–10.5)

## 2016-05-31 LAB — COOXEMETRY PANEL
Carboxyhemoglobin: 1.9 % — ABNORMAL HIGH (ref 0.5–1.5)
Methemoglobin: 1.1 % (ref 0.0–1.5)
O2 Saturation: 77.3 %
Total hemoglobin: 8.8 g/dL — ABNORMAL LOW (ref 12.0–16.0)

## 2016-05-31 LAB — PROTIME-INR
INR: 1.19
Prothrombin Time: 15.2 seconds (ref 11.4–15.2)

## 2016-05-31 LAB — CALCIUM, IONIZED: Calcium, Ionized, Serum: 4.6 mg/dL (ref 4.5–5.6)

## 2016-05-31 LAB — PHOSPHORUS: Phosphorus: 3.5 mg/dL (ref 2.5–4.6)

## 2016-05-31 LAB — MAGNESIUM
Magnesium: 2.4 mg/dL (ref 1.7–2.4)
Magnesium: 2.1 mg/dL (ref 1.7–2.4)

## 2016-05-31 LAB — LACTATE DEHYDROGENASE: LDH: 234 U/L — ABNORMAL HIGH (ref 98–192)

## 2016-05-31 LAB — BRAIN NATRIURETIC PEPTIDE: B Natriuretic Peptide: 270 pg/mL — ABNORMAL HIGH (ref 0.0–100.0)

## 2016-05-31 MED ORDER — INSULIN ASPART 100 UNIT/ML ~~LOC~~ SOLN
0.0000 [IU] | SUBCUTANEOUS | Status: DC
  Administered 2016-05-31 (×2): 4 [IU] via SUBCUTANEOUS
  Administered 2016-06-01: 12 [IU] via SUBCUTANEOUS
  Administered 2016-06-01: 20 [IU] via SUBCUTANEOUS
  Administered 2016-06-01: 16 [IU] via SUBCUTANEOUS
  Administered 2016-06-01: 4 [IU] via SUBCUTANEOUS
  Administered 2016-06-01: 8 [IU] via SUBCUTANEOUS

## 2016-05-31 MED ORDER — INSULIN DETEMIR 100 UNIT/ML ~~LOC~~ SOLN
14.0000 [IU] | Freq: Two times a day (BID) | SUBCUTANEOUS | Status: DC
Start: 2016-05-31 — End: 2016-06-01
  Administered 2016-05-31 (×2): 14 [IU] via SUBCUTANEOUS
  Filled 2016-05-31 (×3): qty 0.14

## 2016-05-31 MED ORDER — FUROSEMIDE 10 MG/ML IJ SOLN
20.0000 mg | Freq: Two times a day (BID) | INTRAMUSCULAR | Status: DC
  Administered 2016-05-31: 20 mg via INTRAVENOUS
  Filled 2016-05-31: qty 2

## 2016-05-31 MED ORDER — ORAL CARE MOUTH RINSE
15.0000 mL | Freq: Two times a day (BID) | OROMUCOSAL | Status: DC
  Administered 2016-06-02 – 2016-06-12 (×10): 15 mL via OROMUCOSAL

## 2016-05-31 MED ORDER — FUROSEMIDE 10 MG/ML IJ SOLN
20.0000 mg | Freq: Once | INTRAMUSCULAR | Status: AC
  Administered 2016-05-31: 20 mg via INTRAVENOUS

## 2016-05-31 MED ORDER — POTASSIUM CHLORIDE 10 MEQ/50ML IV SOLN
10.0000 meq | INTRAVENOUS | Status: AC | PRN
  Administered 2016-05-31 (×3): 10 meq via INTRAVENOUS
  Filled 2016-05-31 (×3): qty 50

## 2016-05-31 MED ORDER — MORPHINE SULFATE (PF) 4 MG/ML IV SOLN
1.0000 mg | INTRAVENOUS | Status: DC | PRN
  Administered 2016-05-31: 4 mg via INTRAVENOUS

## 2016-05-31 MED ORDER — FUROSEMIDE 10 MG/ML IJ SOLN
20.0000 mg | Freq: Four times a day (QID) | INTRAMUSCULAR | Status: DC
  Administered 2016-05-31 (×3): 20 mg via INTRAVENOUS
  Filled 2016-05-31 (×3): qty 2

## 2016-05-31 MED ORDER — WARFARIN SODIUM 2.5 MG PO TABS
2.5000 mg | ORAL_TABLET | Freq: Every day | ORAL | Status: DC
  Administered 2016-05-31: 2.5 mg via ORAL
  Filled 2016-05-31: qty 1

## 2016-05-31 MED ORDER — MIDAZOLAM HCL 2 MG/2ML IJ SOLN
1.0000 mg | INTRAMUSCULAR | Status: DC | PRN
  Administered 2016-06-01: 1 mg via INTRAVENOUS
  Filled 2016-05-31: qty 2

## 2016-05-31 MED ORDER — AMIODARONE HCL 200 MG PO TABS
200.0000 mg | ORAL_TABLET | Freq: Two times a day (BID) | ORAL | Status: DC
  Administered 2016-05-31 – 2016-06-13 (×27): 200 mg via ORAL
  Filled 2016-05-31 (×27): qty 1

## 2016-05-31 MED ORDER — WARFARIN - PHYSICIAN DOSING INPATIENT
Freq: Every day | Status: DC
  Administered 2016-05-31 – 2016-06-01 (×2)
  Administered 2016-06-02: 1

## 2016-05-31 MED ORDER — BOOST / RESOURCE BREEZE PO LIQD
1.0000 | Freq: Three times a day (TID) | ORAL | Status: DC
  Administered 2016-06-01 – 2016-06-02 (×2): 1 via ORAL

## 2016-05-31 MED FILL — Vasopressin Inj 20 Unit/ML: INTRAMUSCULAR | Qty: 3 | Status: AC

## 2016-05-31 MED FILL — Sodium Chloride IV Soln 0.9%: INTRAVENOUS | Qty: 250 | Status: AC

## 2016-05-31 NOTE — Progress Notes (Signed)
Initial Nutrition Assessment  DOCUMENTATION CODES:   Not applicable  INTERVENTION:    Boost Breeze po TID, each supplement provides 250 kcal and 9 grams of protein  NUTRITION DIAGNOSIS:   Increased nutrient needs related to  (post-op healing) as evidenced by estimated needs  GOAL:   Patient will meet greater than or equal to 90% of their needs  MONITOR:   PO intake, Supplement acceptance, Labs, Weight trends, Skin, I & O's  REASON FOR ASSESSMENT:   Consult Assessment of nutrition requirement/status  ASSESSMENT:   61 yo Male with PMH of chronic systolic CHF due to ICM; had rapid decline over past few months including requirement of PleurX catheter due to significant R pleural effusion and decompensation to NYHA Class IV symptoms. Pt has undergone LVAD work up and been approved for VAD placement by [facility], but was initially refused due to "out of network" insurance through [employer].   Pt s/p procedure 5/29: Implantation of  HVAD LV Assist Device  Pt extubated this AM.  A bit groggy.  Prior to surgery was on a Dysphagia 3, thin liquid diet.  PO intake good at 100% per flowsheet records. Medications reviewed and include Lasix, Coumadin, Reglan and Pepcid.  Labs reviewed. Heart Failure Team following.  HVAD parameter stable this AM. CBG's [phone].  Nutrition focused physical exam completed.  No muscle or subcutaneous fat depletion noticed.  Diet Order:  Diet clear liquid Room service appropriate? Yes; Fluid consistency: Thin  Skin:  Reviewed, no issues  Last BM:  5/26  Height:   Ht Readings from Last 1 Encounters:  05/30/16 6' (1.829 m)   Weight:   Wt Readings from Last 1 Encounters:  05/31/16 216 lb 0.8 oz (98 kg)   Ideal Body Weight:  81 kg  BMI:  Body mass index is 29.3 kg/m.  Estimated Nutritional Needs:   Kcal:  2200-2400  Protein:  110-120 gm  Fluid:  per MD  EDUCATION NEEDS:   Education needs no appropriate at this  time  [name] ChattersKatie Shantice Menger, RD, LDN Pager #: [phone] After-Hours Pager #: [phone]

## 2016-05-31 NOTE — Progress Notes (Signed)
Admitted 5/23/18from clinic for heart failure decompensation.  Heartware LVAD implanted on 5/29/2018by Tharon Aquas Trigt as DT VAD/  Vital signs: HR: 89 Doppler Pressure: 91 Automatic BP: 101/89 O2 Sat: 100 on vent Wt in lbs: 200 > 216   LVAD interrogation reveals:  Speed: 2800 Flow: 4.0 Power: 4.3w Peak /Trough: 6/3 Alarms: none   Suction alarm off  Alarm settings: Low flow: 2.0 High Power: 6.0   Drive Line:  Daily using daily dressing kit with aquacel silver strips on exit site  Gtts:  Mil .25 mcg/kg/min Amio 30 mg/hr Levo 3 mcg//min  Blood products: 1 PC and 4 Plasma - in OR 05/30/16  ICD: St Jude single lead Therapy off VVI 40  Labs:  LDH trend: 234  INR trend: 1.36 > 1.19  Anticoagulation Plan: -INR Goal: 2-2.5 -ASA Dose: 325 (started 05/31/16)  Adverse Events on VAD: - Power switching with associated pump stop 05/30/16; controller issue identified as [employer] Medical Device Correction;   controller change out  Night nurse reported "beeping" last night with "connect power" on monitor. It was reported she disconnected the battery, leaving AC power to controller and controller screen went blank and loud continuous alarm occurred. She immediately re-connected the battery and alarm stopped and data re-appeared on controller and bedside screen. Alarm history reviewed with no alarms noted; unable to see events. Bessie (clinical rep from [employer]) downloaded data this am for review by [employer] engineers. Report came back power switching with associated pump stop; previously identified as and Urgent Medical Device Correction by [employer]. Due to recommendations by [employer], controller exchanged. Will return controller SN [id number]. Replaced with [id number]. New back up controller SN [id number] re-programmed at 2800 RPM, low flow alarm 2.0, high power alarm 6.0.   Plan/Recommendations:   1. Daily dressing changes. 2. If intermittent beeping with "connect  power" re-occurs, do not remove either power source. Assure both power sources (either batteries x 2 or battery/AC power) are connected correctly. Page VAD pager if any issues.    Zada Girt RN, VAD Coordinator 24/7 pager [phone]

## 2016-05-31 NOTE — Progress Notes (Signed)
HVAD  Rounding Note  Postop day 1  Subjective:   61 year old patient with ischemic cardiomyopathy and acute on chronic systolic heart failure who presented with fatigue and failure to thrive despite home IV milrinone and maximal medical therapy including daily drainage of pleural effusion with right Pleurx catheter  The patient was assessed and found to be candidate for destination therapy LVAD implantation. He was optimized with diuresis and Plavix washout prior to surgical implantation  The patient had a stable night with good VAD parameters Inhaled Nitric oxide has been weaned to 3 ppm Patient will be extubated today, [name] out and go for mobilization to chair  Postoperative quite lop the related to preoperative Plavix treated with platelet transfusion and currently hemoglobin is stable. Patient started on low-dose Coumadin this p.m.  Chest x-ray shows good VAD orientation and no significant pleural effusion. Chest tubes to remain today.    LVAD INTERROGATION:  VAD:  Flow 4.4liters/min, speed 2800 rpm , power 5.1,   Controller intact  Objective:    Vital Signs:   Temp:  [98.2 F (36.8 C)-100.6 F (38.1 C)] 98.3 F (36.8 C) (05/30 1500) Pulse Rate:  [71-89] 71 (05/30 1500) Resp:  [9-20] 18 (05/30 1200) BP: (85-111)/(59-96) 98/85 (05/30 1500) SpO2:  [94 %-100 %] 96 % (05/30 1500) Arterial Line BP: (74-102)/(65-83) 102/75 (05/30 1500) FiO2 (%):  [40 %-94 %] 94 % (05/30 0900) Weight:  [216 lb 0.8 oz (98 kg)] 216 lb 0.8 oz (98 kg) (05/30 0500) Last BM Date: 05/27/16 Mean arterial Pressure 85 mmHg  Intake/Output:   Intake/Output Summary (Last 24 hours) at 05/31/16 1533 Last data filed at 05/31/16 1500  Gross per 24 hour  Intake          4875.05 ml  Output             2390 ml  Net          2485.05 ml     Physical Exam: General:  Well appearing. No resp difficulty HEENT: normal Neck: supple. JVP . Carotids 2+ bilat; no bruits. No lymphadenopathy or thryomegaly  appreciated. Cor: Mechanical heart sounds with LVAD hum present. Lungs: clear Abdomen: soft, nontender, nondistended. No hepatosplenomegaly. No bruits or masses. Good bowel sounds. Extremities: no cyanosis, clubbing, rash, edema Neuro: alert & orientedx3, cranial nerves grossly intact. moves all 4 extremities w/o difficulty. Affect pleasant  Telemetry: Atrially paced 88/m  Labs: Basic Metabolic Panel:  Recent Labs Lab 05/28/16 0548 05/29/16 0525 05/30/16 0310  05/30/16 0933 05/30/16 1104 05/30/16 1300 05/30/16 1303 05/30/16 1855 05/30/16 1858 05/31/16 0228  NA 132* 132* 131*  < > 135 137 135 138  --  135 135  K 3.7 5.0 3.3*  < > 3.7 3.4* 3.3* 3.5  --  4.2 4.1  CL 97* 95* 95*  < > 92* 93* 101  --   --  98* 101  CO2 28 29 30   --   --   --  27  --   --   --  27  GLUCOSE 232* 152* 169*  < > 164* 170* 119* 127*  --  117* 108*  BUN 7 11 11   < > 9 9 8   --   --  8 8  CREATININE 0.66 0.77 0.67  < > 0.30* 0.40* 0.54*  --  0.58* 0.50* 0.66  CALCIUM 8.2* 8.6* 8.5*  --   --   --  7.9*  --   --   --  8.0*  MG  --   --   --   --   --   --  1.7  --  2.8*  --  2.4  PHOS  --   --   --   --   --   --   --   --   --   --  3.5  < > = values in this interval not displayed.  Liver Function Tests:  Recent Labs Lab 05/25/16 0435 05/31/16 0228  AST 16 49*  ALT 16* 14*  ALKPHOS 95 51  BILITOT 0.5 1.7*  PROT 6.2* 5.3*  ALBUMIN 2.6* 2.9*   No results for input(s): LIPASE, AMYLASE in the last 168 hours.  Recent Labs Lab 05/25/16 0824  AMMONIA 16    CBC:  Recent Labs Lab 05/25/16 0435  05/29/16 0525 05/30/16 0310  05/30/16 0948  05/30/16 1300 05/30/16 1303 05/30/16 1415 05/30/16 1855 05/30/16 1858 05/31/16 0228  WBC 7.6  < > 10.2 11.1*  --   --   --  13.3*  --   --  11.4*  --  10.6*  NEUTROABS 5.5  --   --   --   --   --   --   --   --   --   --   --  8.4*  HGB 11.6*  < > 12.2* 12.0*  < > 9.3*  < > 7.8* 8.2*  --  8.7* 8.2* 8.6*  HCT 35.8*  < > 37.8* 36.5*  < > 27.9*  < >  23.7* 24.0*  --  26.8* 24.0* 26.0*  MCV 89.1  < > 88.7 87.3  --   --   --  87.1  --   --  86.5  --  86.4  PLT 165  < > 161 139*  --  145*  --  158  --  150 172  --  145*  < > = values in this interval not displayed.  INR:  Recent Labs Lab 05/25/16 0750 05/30/16 1300 05/30/16 1415 05/31/16 0228  INR 1.15 1.38 1.36 1.19    Other results:  EKG:   Imaging: Dg Chest Port 1 View  Result Date: 05/31/2016 CLINICAL DATA:  Hypoxia EXAM: PORTABLE CHEST 1 VIEW COMPARISON:  May 30, 2016 FINDINGS: Endotracheal tube tip is 5.3 cm above the carina. Nasogastric tube tip and side port are below the diaphragm. Swan-Ganz catheter tip is in the right main pulmonary artery. There is a chest tube on the right as well as a mediastinal drain, stable. There is a left ventricular assist device as well as pacemaker leads attached to the right atrium and right ventricle. No pneumothorax. There is consolidation in the left lower lobe with small left pleural effusion, increased from 1 day prior. There is also a right pleural effusion with right base atelectasis, essentially stable. Heart remains enlarged with pulmonary venous hypertension. No adenopathy. There is aortic atherosclerosis. No bone lesions. IMPRESSION: Tube and catheter positions as described without appreciable pneumothorax. Increase in consolidation left lower lobe with small left pleural effusion. Question atelectasis with possible superimposed pneumonia in the left base. There is evidence of a degree of underlying congestive heart failure. Persistent right pleural effusion with right base atelectasis, unchanged. There is aortic atherosclerosis. Electronically Signed   By: Bretta BangWilliam  Woodruff III M.D.   On: 05/31/2016 07:40   Dg Chest Portable 1 View  Result Date: 05/30/2016 CLINICAL DATA:  Pneumothorax. EXAM: PORTABLE CHEST 1 VIEW COMPARISON:  05/27/2016. FINDINGS: Endotracheal tube noted with its tip 3 cm above the carina. NG tube noted with tip below  left hemidiaphragm, right  IJ line in stable position. Swan-Ganz catheter tip in the pulmonary outflow tract. Mediastinal drainage catheters in good anatomic position . Right chest tube noted with its tip over the right upper chest. No pneumothorax. Bilateral atelectatic changes, improved from prior exam. Small right pleural effusion. Cardiac pacer in stable position. Left ventricular assist device noted over the left lower chest. IMPRESSION: 1. Two positioning including right chest tube as above. No pneumothorax. Interim improvement of bilateral atelectasis. Interim improvement right pleural effusion . 2. Cardiac pacer in stable position. Left ventricular assist device noted over the left lower chest. Electronically Signed   By: Maisie Fus  Register   On: 05/30/2016 12:55      Medications:     Scheduled Medications: . acetaminophen  1,000 mg Oral Q6H   Or  . acetaminophen (TYLENOL) oral liquid 160 mg/5 mL  1,000 mg Per Tube Q6H  . amiodarone  200 mg Oral BID  . aspirin EC  325 mg Oral Daily   Or  . aspirin  324 mg Per Tube Daily   Or  . aspirin  300 mg Rectal Daily  . bisacodyl  10 mg Oral Daily   Or  . bisacodyl  10 mg Rectal Daily  . docusate sodium  200 mg Oral Daily  . feeding supplement  1 Container Oral TID BM  . furosemide  20 mg Intravenous Q6H  . insulin aspart  0-24 Units Subcutaneous Q4H  . insulin detemir  14 Units Subcutaneous BID  . mouth rinse  15 mL Mouth Rinse BID  . metoCLOPramide (REGLAN) injection  10 mg Intravenous Q6H  . [START ON 06/01/2016] pantoprazole  40 mg Oral Daily  . sodium chloride flush  3 mL Intravenous Q12H  . warfarin  2.5 mg Oral q1800  . Warfarin - Physician Dosing Inpatient   Does not apply q1800     Infusions: . sodium chloride 20 mL/hr at 05/31/16 1400  . sodium chloride    . sodium chloride 20 mL (05/30/16 2201)  . sodium chloride    . cefUROXime (ZINACEF)  IV Stopped (05/31/16 0536)  . dexmedetomidine Stopped (05/31/16 0752)  .  EPINEPHrine 4 mg in dextrose 5% 250 mL infusion (16 mcg/mL)    . insulin (NOVOLIN-R) infusion Stopped (05/31/16 1405)  . lactated ringers 20 mL/hr at 05/31/16 1400  . lactated ringers 20 mL/hr at 05/31/16 1400  . milrinone 0.25 mcg/kg/min (05/31/16 1400)  . norepinephrine (LEVOPHED) Adult infusion Stopped (05/31/16 1358)  . vancomycin Stopped (05/31/16 0929)     PRN Medications:  sodium chloride, fentaNYL (SUBLIMAZE) injection, midazolam, ondansetron (ZOFRAN) IV, oxyCODONE, sodium chloride flush, traMADol   Assessment:  Acute on chronic systolic heart failure from ischemic cardiomyopathy Previous placement of AICD pacemaker system Preoperative point hypertension treated with sildenafil  Recent smoker history   Plan/Discussion:    Patient showing excellent early recovery, neurologically intact We'll progress with extubation, removal of Swan and mobilization. Lasix 20 mg IV every 6 hours for diuresis ordered Coumadin will be slowly started Continue milrinone at 0.25 for  RV function today  I reviewed the LVAD parameters from today, and compared the results to the patient's prior recorded data.  No programming changes were made.  The LVAD is functioning within specified parameters.  The patient performs LVAD self-test daily.  LVAD interrogation was negative for any significant power changes, alarms or PI events/speed drops.  LVAD equipment check completed and is in good working order.  Back-up equipment present.   LVAD education done on emergency  procedures and precautions and reviewed exit site care.  Length of Stay: 7  Kathlee Nations [place] III 05/31/2016, 3:33 PM

## 2016-05-31 NOTE — Plan of Care (Signed)
Problem: Activity: Goal: Risk for activity intolerance will decrease Outcome: Progressing Pt has been sitting up in chair for 2+ hours and is tolerating it well.  Problem: Cardiac: Goal: Ability to maintain an adequate cardiac output will improve Outcome: Progressing Pt is able to maintain adequate blood pressures within normal limits, RN is able to doppler pulses bilaterally on upper and lower extremities, capillary refill is less than 3 seconds on all extremities, and is having no signs of bleeding.  Problem: Respiratory: Goal: Ability to maintain adequate ventilation will improve Outcome: Progressing Pt was extubated at 0900 this morning and is tolerating 4L [place] well. Pt is able to get his incentive spirometer up to 1250.

## 2016-05-31 NOTE — Progress Notes (Signed)
CSW met with patient and father at bedside. Patient extubated this morning and states he is feeling some pain and discomfort but "it's to be expected". Patient was in good spirits and spoke of the support from his family and the HF team. Patient grateful the surgery was approved here at [facility]. CSW will continue to follow for support and any other needs that may arise during hospitalization. Raquel Sarna, [place], [place]

## 2016-05-31 NOTE — Procedures (Signed)
Extubation Procedure Note  Patient Details:   Name: Dalton RainwaterLarry D Gillean DOB: [birth year] MRN: [id number]   Airway Documentation:     Evaluation  O2 sats: stable throughout Complications: No apparent complications Patient did tolerate procedure well. Bilateral Breath Sounds: Clear, Diminished   Yes  Patient had a NIF of -35 and a VC of 1.3. RT extubated patient to 4L nasal canula with 3 PPM nitric per MD request.  Morley KosJohnson, Reford Olliff Leroy 05/31/2016, 9:02 AM

## 2016-05-31 NOTE — Progress Notes (Signed)
OT Cancellation Note  Patient Details Name: Dalton RainwaterLarry D Garlington MRN: [id number] DOB: Oct 05, 1955   Cancelled Treatment:    Reason Eval/Treat Not Completed: Patient not medically ready.  As per PT notes.  Will defer initiation of OT eval until tomorrow - pt just extubated and nsg plans to dangle pt this pm.    Jeani HawkingWendi Maddock Finigan, OTR/L [phone]   Jeani Hawkingonarpe, Paxtyn Wisdom M 05/31/2016, 11:13 AM

## 2016-05-31 NOTE — Progress Notes (Signed)
PT Cancellation Note  Patient Details Name: [name] MRN: [id number] DOB: Jan 12, 1955   Cancelled Treatment:    Reason Eval/Treat Not Completed: Medical issues which prohibited therapy. Pt currently on vent and to be extubated this AM. Nurse plans to dangle pt this PM if able. Will follow up tomorrow for PT eval.   Angelina OkCary W [facility] 05/31/2016, 8:45 AM  Skip Mayerary Shawna Kiener PT [phone]

## 2016-05-31 NOTE — Progress Notes (Signed)
Controller changed successfully with no adverse events. VAD coordinator, NP and [employer] clinical specialist at bedside for change. Will continue to monitor the pt closely.

## 2016-05-31 NOTE — Progress Notes (Signed)
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 Received 3 albumin and 1 UPRBCs in the unit. No issues over night.   Remains on milrinone 0.25 mcg, norepi 3 mcg, and amio 30 mg per day. Remains intubated with 3 PPM NO.    CO 5.5 CI 2.4  Co-ox 77% PA 38/15 CVP 7  LDH 234  LVAD INTERROGATION:  HVAD:  Flow  4.3 liters/min, RPM 2800  Watts 4.4.  Peak 6.5, trough 3.   Objective:    Vital Signs:   Temp:  [97 F (36.1 C)-100.6 F (38.1 C)] 98.6 F (37 C) (05/30 0800) Pulse Rate:  [80-88] 88 (05/30 0755) Resp:  [9-17] 10 (05/30 0755) BP: (85-101)/(59-89) 94/82 (05/30 0800) SpO2:  [94 %-100 %] 100 % (05/30 0800) Arterial Line BP: (74-100)/(65-83) 85/73 (05/30 0800) FiO2 (%):  [40 %-50 %] 40 % (05/30 0800) Weight:  [216 lb 0.8 oz (98 kg)] 216 lb 0.8 oz (98 kg) (05/30 0500) Last BM Date: 05/27/16 Mean arterial Pressure 79-82  Intake/Output:   Intake/Output Summary (Last 24 hours) at 05/31/16 0824 Last data filed at 05/31/16 0800  Gross per 24 hour  Intake         13724.12 ml  Output             4030 ml  Net          9694.12 ml     Physical Exam: CVP 7 General:  Intubated.  HEENT: normal  Neck: supple. JVP 6-7 . Carotids 2+ bilat; no bruits. No lymphadenopathy or thryomegaly appreciated. Cor: Mechanical heart sounds with LVAD hum present. Lungs: Mechanical Breath sounds.  Abdomen: soft, nontender, nondistended. No hepatosplenomegaly. No bruits or masses. No bowel sounds. Driveline: C/D/I; securement device intact  Extremities: no cyanosis, clubbing, rash, R and LLE SCDs Neuro: Intubated follows commands. MAE  Telemetry: A paced 80s   Labs: Basic Metabolic Panel:  Recent Labs Lab 05/28/16 0548 05/29/16 0525 05/30/16 0310  05/30/16 0933 05/30/16 1104 05/30/16 1300 05/30/16 1303 05/30/16 1855 05/30/16 1858 05/31/16 0228  NA 132* 132* 131*  < > 135 137 135 138  --  135 135  K 3.7 5.0 3.3*  < > 3.7 3.4* 3.3* 3.5  --   4.2 4.1  CL 97* 95* 95*  < > 92* 93* 101  --   --  98* 101  CO2 28 29 30   --   --   --  27  --   --   --  27  GLUCOSE 232* 152* 169*  < > 164* 170* 119* 127*  --  117* 108*  BUN 7 11 11   < > 9 9 8   --   --  8 8  CREATININE 0.66 0.77 0.67  < > 0.30* 0.40* 0.54*  --  0.58* 0.50* 0.66  CALCIUM 8.2* 8.6* 8.5*  --   --   --  7.9*  --   --   --  8.0*  MG  --   --   --   --   --   --  1.7  --  2.8*  --  2.4  PHOS  --   --   --   --   --   --   --   --   --   --  3.5  < > = values in this interval not displayed.  Liver Function Tests:  Recent Labs Lab 05/25/16 [phone]  05/31/16 0228  AST 16 49*  ALT 16* 14*  ALKPHOS 95 51  BILITOT 0.5 1.7*  PROT 6.2* 5.3*  ALBUMIN 2.6* 2.9*   No results for input(s): LIPASE, AMYLASE in the last 168 hours.  Recent Labs Lab 05/25/16 0824  AMMONIA 16    CBC:  Recent Labs Lab 05/25/16 0435  05/29/16 0525 05/30/16 0310  05/30/16 0948  05/30/16 1300 05/30/16 1303 05/30/16 1415 05/30/16 1855 05/30/16 1858 05/31/16 0228  WBC 7.6  < > 10.2 11.1*  --   --   --  13.3*  --   --  11.4*  --  10.6*  NEUTROABS 5.5  --   --   --   --   --   --   --   --   --   --   --  8.4*  HGB 11.6*  < > 12.2* 12.0*  < > 9.3*  < > 7.8* 8.2*  --  8.7* 8.2* 8.6*  HCT 35.8*  < > 37.8* 36.5*  < > 27.9*  < > 23.7* 24.0*  --  26.8* 24.0* 26.0*  MCV 89.1  < > 88.7 87.3  --   --   --  87.1  --   --  86.5  --  86.4  PLT 165  < > 161 139*  --  145*  --  158  --  150 172  --  145*  < > = values in this interval not displayed.  INR:  Recent Labs Lab 05/25/16 0750 05/30/16 1300 05/30/16 1415 05/31/16 0228  INR 1.15 1.38 1.36 1.19    Other results:  EKG:   Imaging: Dg Chest Port 1 View  Result Date: 05/31/2016 CLINICAL DATA:  Hypoxia EXAM: PORTABLE CHEST 1 VIEW COMPARISON:  May 30, 2016 FINDINGS: Endotracheal tube tip is 5.3 cm above the carina. Nasogastric tube tip and side port are below the diaphragm. Swan-Ganz catheter tip is in the right main pulmonary artery.  There is a chest tube on the right as well as a mediastinal drain, stable. There is a left ventricular assist device as well as pacemaker leads attached to the right atrium and right ventricle. No pneumothorax. There is consolidation in the left lower lobe with small left pleural effusion, increased from 1 day prior. There is also a right pleural effusion with right base atelectasis, essentially stable. Heart remains enlarged with pulmonary venous hypertension. No adenopathy. There is aortic atherosclerosis. No bone lesions. IMPRESSION: Tube and catheter positions as described without appreciable pneumothorax. Increase in consolidation left lower lobe with small left pleural effusion. Question atelectasis with possible superimposed pneumonia in the left base. There is evidence of a degree of underlying congestive heart failure. Persistent right pleural effusion with right base atelectasis, unchanged. There is aortic atherosclerosis. Electronically Signed   By: Lowella Grip III M.D.   On: 05/31/2016 07:40   Dg Chest Portable 1 View  Result Date: 05/30/2016 CLINICAL DATA:  Pneumothorax. EXAM: PORTABLE CHEST 1 VIEW COMPARISON:  05/27/2016. FINDINGS: Endotracheal tube noted with its tip 3 cm above the carina. NG tube noted with tip below left hemidiaphragm, right IJ line in stable position. Swan-Ganz catheter tip in the pulmonary outflow tract. Mediastinal drainage catheters in good anatomic position . Right chest tube noted with its tip over the right upper chest. No pneumothorax. Bilateral atelectatic changes, improved from prior exam. Small right pleural effusion. Cardiac pacer in stable position. Left ventricular assist device noted over the left lower chest. IMPRESSION: 1.  Two positioning including right chest tube as above. No pneumothorax. Interim improvement of bilateral atelectasis. Interim improvement right pleural effusion . 2. Cardiac pacer in stable position. Left ventricular assist device noted  over the left lower chest. Electronically Signed   By: Marcello Moores  Register   On: 05/30/2016 12:55      Medications:     Scheduled Medications: . acetaminophen  1,000 mg Oral Q6H   Or  . acetaminophen (TYLENOL) oral liquid 160 mg/5 mL  1,000 mg Per Tube Q6H  . amiodarone  200 mg Oral BID  . aspirin EC  325 mg Oral Daily   Or  . aspirin  324 mg Per Tube Daily   Or  . aspirin  300 mg Rectal Daily  . bisacodyl  10 mg Oral Daily   Or  . bisacodyl  10 mg Rectal Daily  . chlorhexidine gluconate (MEDLINE KIT)  15 mL Mouth Rinse BID  . docusate sodium  200 mg Oral Daily  . furosemide  20 mg Intravenous BID  . insulin aspart  0-24 Units Subcutaneous Q4H  . insulin detemir  14 Units Subcutaneous BID  . mouth rinse  15 mL Mouth Rinse 10 times per day  . metoCLOPramide (REGLAN) injection  10 mg Intravenous Q6H  . [START ON 06/01/2016] pantoprazole  40 mg Oral Daily  . rifampin  600 mg Oral Once  . sodium chloride flush  3 mL Intravenous Q12H  . warfarin  2.5 mg Oral q1800  . Warfarin - Physician Dosing Inpatient   Does not apply q1800     Infusions: . sodium chloride 20 mL/hr at 05/30/16 1900  . sodium chloride    . sodium chloride 20 mL (05/30/16 2201)  . sodium chloride    . cefUROXime (ZINACEF)  IV Stopped (05/31/16 0536)  . dexmedetomidine Stopped (05/31/16 0752)  . EPINEPHrine 4 mg in dextrose 5% 250 mL infusion (16 mcg/mL)    . insulin (NOVOLIN-R) infusion 2.2 Units/hr (05/31/16 0755)  . lactated ringers    . lactated ringers 20 mL/hr at 05/30/16 1900  . lactated ringers 20 mL/hr at 05/30/16 1900  . milrinone 0.25 mcg/kg/min (05/31/16 0700)  . norepinephrine (LEVOPHED) Adult infusion 4 mcg/min (05/31/16 0750)  . vancomycin Stopped (05/30/16 2242)     PRN Medications:  sodium chloride, fentaNYL (SUBLIMAZE) injection, lactated ringers, midazolam, morphine injection, ondansetron (ZOFRAN) IV, oxyCODONE, sodium chloride flush, traMADol   Assessment/Plan/Discussion   1. 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  Planning for extubation later today.  Remain on milrinone 0.25 mcg + norepi 4 mcg. Volume status stable. On 20 mg IV lasix twice a day.  Receiving 324 mg aspirin + coumadin. INR per Dr Darcey Nora.  2. A/C Systolic Heart Failure  8/85/02 ECHO EF 20-25%. CO/CI 5.1/2.4.  CVP 7.  3. R Pleural Effusion- R pleurex catheter 4. CAD: 3V CAD S/P stent Prox LAD and PTCA to subtotally occluded OMI 01/2016  5. Pulmonary HTN: Lower pulmonary pressures 32/13. On 3 PPM NO. Prior to admit he was on revatio. Hold off for now. 6. Former Smoker.    I reviewed the HVAD parameters from today, and compared the results to the patient's prior recorded data.  No programming changes were made.  The HVAD is functioning within specified parameters.    Length of Stay: [place] NP-C  05/31/2016, 8:24 AM  VAD Team --- VAD ISSUES ONLY--- Pager [phone] (7am - 7am)  Patient seen with NP, agree with the above note.  Doing well, now extubated.  Weaning off norepinephrine today with MAP around 80.  Good cardiac output, CVP 7 getting low dose IV Lasix.    Remains on NO 3 ppm.  PA pressure not significantly elevated, would see how he does weaning off NO and can start Revatio if needed (may be able to avoid).    HVAD parameters stable this morning and reviewed.   On ASA 325 and warfarin started.   CRITICAL CARE Performed by: Loralie Champagne  Total critical care time: 40 minutes  Critical care time was exclusive of separately billable procedures and treating other patients.  Critical care was necessary to treat or prevent imminent or life-threatening deterioration.  Critical care was time spent personally by me on the following activities: development of treatment plan with patient and/or surrogate as well as nursing, discussions with consultants, evaluation of patient's response to treatment, examination of patient, obtaining history from patient or surrogate, ordering and  performing treatments and interventions, ordering and review of laboratory studies, ordering and review of radiographic studies, pulse oximetry and re-evaluation of patient's condition.  Loralie Champagne 05/31/2016 9:16 AM  Advanced Heart Failure Team  Pager [phone] (M-F; 7a - 4p)  Please contact [facility] for night-coverage after hours (4p -7a ) and weekends on [email or web address]

## 2016-05-31 NOTE — Plan of Care (Signed)
Problem: Cardiac: Goal: Ability to maintain an adequate cardiac output will improve Outcome: Progressing Pt maintained Cardiac Indexes above 2.0 throughout the entire shift. Pt maintained chest tube output of 30-40cc/ hour throughout shift.   Problem: Respiratory: Goal: Ability to maintain adequate ventilation will improve Outcome: Progressing Pt ABGs have been within normal limits and patient has tolerated the weaning of Nitric Oxide from 30 ppm to 4ppm.

## 2016-06-01 ENCOUNTER — Encounter (HOSPITAL_COMMUNITY): Payer: Self-pay | Admitting: Cardiothoracic Surgery

## 2016-06-01 ENCOUNTER — Inpatient Hospital Stay (HOSPITAL_COMMUNITY): Payer: BLUE CROSS/BLUE SHIELD

## 2016-06-01 DIAGNOSIS — I5023 Acute on chronic systolic (congestive) heart failure: Secondary | ICD-10-CM

## 2016-06-01 DIAGNOSIS — Z95811 Presence of heart assist device: Secondary | ICD-10-CM

## 2016-06-01 LAB — POCT I-STAT, CHEM 8
BUN: 15 mg/dL (ref 6–20)
CREATININE: 0.7 mg/dL (ref 0.61–1.24)
Calcium, Ion: 1.13 mmol/L — ABNORMAL LOW (ref 1.15–1.40)
Chloride: 91 mmol/L — ABNORMAL LOW (ref 101–111)
GLUCOSE: 376 mg/dL — AB (ref 65–99)
HCT: 22 % — ABNORMAL LOW (ref 39.0–52.0)
HEMOGLOBIN: 7.5 g/dL — AB (ref 13.0–17.0)
Potassium: 3.6 mmol/L (ref 3.5–5.1)
Sodium: 131 mmol/L — ABNORMAL LOW (ref 135–145)
TCO2: 27 mmol/L (ref 0–100)

## 2016-06-01 LAB — POCT I-STAT EG7
Acid-Base Excess: 2 mmol/L (ref 0.0–2.0)
Bicarbonate: 27.6 mmol/L (ref 20.0–28.0)
CALCIUM ION: 1.14 mmol/L — AB (ref 1.15–1.40)
HEMATOCRIT: 23 % — AB (ref 39.0–52.0)
Hemoglobin: 7.8 g/dL — ABNORMAL LOW (ref 13.0–17.0)
O2 SAT: 81 %
PCO2 VEN: 45.7 mmHg (ref 44.0–60.0)
Patient temperature: 37.2
Potassium: 3.3 mmol/L — ABNORMAL LOW (ref 3.5–5.1)
Sodium: 137 mmol/L (ref 135–145)
TCO2: 29 mmol/L (ref 0–100)
pH, Ven: 7.39 (ref 7.250–7.430)
pO2, Ven: 46 mmHg — ABNORMAL HIGH (ref 32.0–45.0)

## 2016-06-01 LAB — COMPREHENSIVE METABOLIC PANEL
ALT: 17 U/L (ref 17–63)
AST: 41 U/L (ref 15–41)
Albumin: 2.7 g/dL — ABNORMAL LOW (ref 3.5–5.0)
Alkaline Phosphatase: 67 U/L (ref 38–126)
Anion gap: 8 (ref 5–15)
BUN: 12 mg/dL (ref 6–20)
CO2: 26 mmol/L (ref 22–32)
Calcium: 8.1 mg/dL — ABNORMAL LOW (ref 8.9–10.3)
Chloride: 96 mmol/L — ABNORMAL LOW (ref 101–111)
Creatinine, Ser: 0.78 mg/dL (ref 0.61–1.24)
GFR calc Af Amer: >60 mL/min (ref >60)
GFR calc non Af Amer: >60 mL/min (ref >60)
Glucose, Bld: 203 mg/dL — ABNORMAL HIGH (ref 65–99)
Potassium: 3.9 mmol/L (ref 3.5–5.1)
Sodium: 130 mmol/L — ABNORMAL LOW (ref 135–145)
Total Bilirubin: 1.9 mg/dL — ABNORMAL HIGH (ref 0.3–1.2)
Total Protein: 5.6 g/dL — ABNORMAL LOW (ref 6.5–8.1)

## 2016-06-01 LAB — CBC WITH DIFFERENTIAL/PLATELET
Basophils Absolute: 0 10*3/uL (ref 0.0–0.1)
Basophils Relative: 0 %
Eosinophils Absolute: 0.1 10*3/uL (ref 0.0–0.7)
Eosinophils Relative: 1 %
HCT: 24.1 % — ABNORMAL LOW (ref 39.0–52.0)
Hemoglobin: 7.9 g/dL — ABNORMAL LOW (ref 13.0–17.0)
Lymphocytes Relative: 7 %
Lymphs Abs: 0.7 10*3/uL (ref 0.7–4.0)
MCH: 28.3 pg (ref 26.0–34.0)
MCHC: 32.8 g/dL (ref 30.0–36.0)
MCV: 86.4 fL (ref 78.0–100.0)
Monocytes Absolute: 1.1 10*3/uL — ABNORMAL HIGH (ref 0.1–1.0)
Monocytes Relative: 10 %
Neutro Abs: 8.9 10*3/uL — ABNORMAL HIGH (ref 1.7–7.7)
Neutrophils Relative %: 83 %
Platelets: 123 10*3/uL — ABNORMAL LOW (ref 150–400)
RBC: 2.79 MIL/uL — ABNORMAL LOW (ref 4.22–5.81)
RDW: 16.4 % — ABNORMAL HIGH (ref 11.5–15.5)
WBC: 10.8 10*3/uL — ABNORMAL HIGH (ref 4.0–10.5)

## 2016-06-01 LAB — PROTIME-INR
INR: 1.21
Prothrombin Time: 15.4 seconds — ABNORMAL HIGH (ref 11.4–15.2)

## 2016-06-01 LAB — LACTATE DEHYDROGENASE: LDH: 254 U/L — ABNORMAL HIGH (ref 98–192)

## 2016-06-01 LAB — GLUCOSE, CAPILLARY
GLUCOSE-CAPILLARY: 209 mg/dL — AB (ref 65–99)
GLUCOSE-CAPILLARY: 285 mg/dL — AB (ref 65–99)
GLUCOSE-CAPILLARY: 355 mg/dL — AB (ref 65–99)
Glucose-Capillary: 189 mg/dL — ABNORMAL HIGH (ref 65–99)
Glucose-Capillary: 301 mg/dL — ABNORMAL HIGH (ref 65–99)
Glucose-Capillary: 303 mg/dL — ABNORMAL HIGH (ref 65–99)
Glucose-Capillary: 353 mg/dL — ABNORMAL HIGH (ref 65–99)

## 2016-06-01 LAB — PHOSPHORUS: Phosphorus: 3.9 mg/dL (ref 2.5–4.6)

## 2016-06-01 LAB — MAGNESIUM: Magnesium: 2 mg/dL (ref 1.7–2.4)

## 2016-06-01 LAB — COOXEMETRY PANEL
Carboxyhemoglobin: 1.5 % (ref 0.5–1.5)
Methemoglobin: 1.3 % (ref 0.0–1.5)
O2 Saturation: 79.4 %
Total hemoglobin: 9.6 g/dL — ABNORMAL LOW (ref 12.0–16.0)

## 2016-06-01 MED ORDER — WARFARIN SODIUM 5 MG PO TABS
5.0000 mg | ORAL_TABLET | Freq: Every day | ORAL | Status: DC
  Administered 2016-06-01 – 2016-06-02 (×2): 5 mg via ORAL
  Filled 2016-06-01 (×2): qty 1

## 2016-06-01 MED ORDER — NITROGLYCERIN IN D5W 200-5 MCG/ML-% IV SOLN
0.0000 ug/min | INTRAVENOUS | Status: DC
  Administered 2016-06-01: 20 ug/min via INTRAVENOUS
  Filled 2016-06-01: qty 250

## 2016-06-01 MED ORDER — FE FUMARATE-B12-VIT C-FA-IFC PO CAPS
1.0000 | ORAL_CAPSULE | Freq: Two times a day (BID) | ORAL | Status: DC
  Administered 2016-06-01 – 2016-06-13 (×25): 1 via ORAL
  Filled 2016-06-01 (×25): qty 1

## 2016-06-01 MED ORDER — SILDENAFIL CITRATE 20 MG PO TABS
20.0000 mg | ORAL_TABLET | Freq: Three times a day (TID) | ORAL | Status: DC
  Administered 2016-06-01 – 2016-06-13 (×37): 20 mg via ORAL
  Filled 2016-06-01 (×38): qty 1

## 2016-06-01 MED ORDER — SPIRONOLACTONE 25 MG PO TABS
12.5000 mg | ORAL_TABLET | Freq: Every day | ORAL | Status: DC
  Administered 2016-06-01: 12.5 mg via ORAL
  Filled 2016-06-01: qty 1

## 2016-06-01 MED ORDER — POTASSIUM CHLORIDE 10 MEQ/50ML IV SOLN
10.0000 meq | INTRAVENOUS | Status: AC
  Administered 2016-06-01 (×3): 10 meq via INTRAVENOUS
  Filled 2016-06-01 (×3): qty 50

## 2016-06-01 MED ORDER — INSULIN ASPART 100 UNIT/ML ~~LOC~~ SOLN: 0.0000 [IU] | SUBCUTANEOUS | Status: DC

## 2016-06-01 MED ORDER — INSULIN DETEMIR 100 UNIT/ML ~~LOC~~ SOLN
30.0000 [IU] | Freq: Two times a day (BID) | SUBCUTANEOUS | Status: DC
  Administered 2016-06-01 – 2016-06-02 (×3): 30 [IU] via SUBCUTANEOUS
  Filled 2016-06-01 (×4): qty 0.3

## 2016-06-01 MED ORDER — VANCOMYCIN HCL IN DEXTROSE 1-5 GM/200ML-% IV SOLN
1000.0000 mg | Freq: Two times a day (BID) | INTRAVENOUS | Status: AC
Start: 2016-06-01 — End: 2016-06-01
  Administered 2016-06-01: 1000 mg via INTRAVENOUS
  Filled 2016-06-01: qty 200

## 2016-06-01 MED ORDER — HYDRALAZINE HCL 20 MG/ML IJ SOLN
10.0000 mg | INTRAMUSCULAR | Status: DC | PRN
  Administered 2016-06-01 – 2016-06-02 (×3): 10 mg via INTRAVENOUS
  Filled 2016-06-01 (×2): qty 1

## 2016-06-01 MED ORDER — FUROSEMIDE 10 MG/ML IJ SOLN
10.0000 mg/h | INTRAVENOUS | Status: DC
  Administered 2016-06-01: 8 mg/h via INTRAVENOUS
  Administered 2016-06-03: 10 mg/h via INTRAVENOUS
  Filled 2016-06-01 (×6): qty 25

## 2016-06-01 MED ORDER — INSULIN DETEMIR 100 UNIT/ML ~~LOC~~ SOLN
20.0000 [IU] | Freq: Two times a day (BID) | SUBCUTANEOUS | Status: DC
  Administered 2016-06-01: 20 [IU] via SUBCUTANEOUS
  Filled 2016-06-01 (×2): qty 0.2

## 2016-06-01 MED ORDER — HYDRALAZINE HCL 20 MG/ML IJ SOLN
INTRAMUSCULAR | Status: AC
  Filled 2016-06-01: qty 1

## 2016-06-01 MED ORDER — GUAIFENESIN ER 600 MG PO TB12
600.0000 mg | ORAL_TABLET | Freq: Two times a day (BID) | ORAL | Status: DC
  Administered 2016-06-01 – 2016-06-13 (×25): 600 mg via ORAL
  Filled 2016-06-01 (×25): qty 1

## 2016-06-01 MED ORDER — INSULIN ASPART 100 UNIT/ML ~~LOC~~ SOLN
0.0000 [IU] | SUBCUTANEOUS | Status: DC
  Administered 2016-06-01: 15 [IU] via SUBCUTANEOUS
  Administered 2016-06-01: 16 [IU] via SUBCUTANEOUS

## 2016-06-01 MED ORDER — NITROGLYCERIN IN D5W 200-5 MCG/ML-% IV SOLN: 0.0000 ug/min | INTRAVENOUS | Status: DC

## 2016-06-01 NOTE — Progress Notes (Signed)
Admitted 5/23/18from clinic for heart failure decompensation.  Heartware LVAD implanted on 5/29/2018by Tharon Aquas Trigt as DT VAD.  Vital signs: HR: 94 Doppler Pressure: 92 Automatic BP: 101/87 (94) O2 Sat: 100  Wt in lbs: 200 > 216>219   LVAD interrogation reveals:  Speed: 2800 Flow: 5.1 Power: 4.6w Peak /Trough: 8/3 Alarms: none   Suction alarm turned ON TODAY  Alarm settings: Low flow: 2.0 High Power: 6.0   Drive Line:  Daily using daily dressing kit with aquacel silver strips on exit site  Gtts:  Mil .25 mcg/kg/min Nitro 10 mcg Lasix 86m  Blood products: 1 PC and 4 Plasma - in OR 05/30/16  ICD: St Jude single lead Therapy off VVI 40  Labs:  LDH trend: 234>254  INR trend: 1.36 > 1.19>1.21  Anticoagulation Plan: -INR Goal: 2-2.5 -ASA Dose: 325 (started 05/31/16)  Adverse Events on VAD: None overnight  Plan/Recommendations:   1. Daily dressing changes. 2. If intermittent beeping with "connect power" re-occurs, do not remove either power source. Assure both power sources (either batteries x 2 or battery/AC power) are connected correctly. Page VAD pager if any issues.  3. Suction alarm turned on today.    STanda RockersRN, VAD Coordinator 24/7 pager [phone]

## 2016-06-01 NOTE — Progress Notes (Signed)
CSW met at bedside with patient and father and mother visiting. Patient was sitting up in chair and states "wish I was further along". CSW provided encouragement as patient is doing well and reminded him he is only Day 2 post op. Family very supportive and Dad states plans to assist with dressing change today to become familiar with the process. CSW continues to follow for supportive needs throughout hospitalization. Raquel Sarna, [place], [place]

## 2016-06-01 NOTE — Progress Notes (Signed)
RT note: RT titrated patients Nitric to 0 at 11.00, per wean protocol and MD order. RN aware.

## 2016-06-01 NOTE — Progress Notes (Addendum)
CMC placed for LVAD. Spoke with Annice PihJackie CSW heart team regarding potential home needs. Patient is from home with father who acts as primary caregiver/ support person. Sister lives close by and can act as support as well. Patient active with [facility] with milrinone gtt and pleurex (managed by father) pta. Patient states he has two pleurex bulbs at home and has ordered more before and will reorder when he gets home.  Patient will need HH RN at a minimum and [facility]. CM will continue to follow.

## 2016-06-01 NOTE — Evaluation (Signed)
Occupational Therapy Evaluation Patient Details Name: Dalton RainwaterLarry D Benedetti MRN: [id number] DOB: [birth year] Today's Date: 06/01/2016    History of Present Illness Pt adm with  Acute on chronic systolic CHF due to ICM and plans for LVAD after insurance approval. 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 PMH - Rt pleural effusion with pleurx, CHF, DM, HTN, CAD, PVD. BLE bypass grafts, AICD   Clinical Impression   Pt admitted with above. He demonstrates the below listed deficits and will benefit from continued OT to maximize safety and independence with BADLs.  Pt doing very well.  He currently requires min A +2 for functional mobility and mod - max A for ADLs.  Family and [employer] reps present during eval and instruction began with pt and father on switching power supply.  VSS througout eval.  Anticipate good progress.       Follow Up Recommendations  No OT follow up;Supervision/Assistance - 24 hour    Equipment Recommendations  3 in 1 bedside commode;Tub/shower seat    Recommendations for Other Services       Precautions / Restrictions Precautions Precautions: Sternal;Fall Precaution Comments: reviewed sternal precautions       Mobility Bed Mobility               General bed mobility comments: Pt up in chair  Transfers Overall transfer level: Needs assistance Equipment used: Pushed w/c Transfers: Sit to/from Stand Sit to Stand: +2 physical assistance;Min assist         General transfer comment: Verbal cues for technique. Assist to bring hips up and for balance    Balance Overall balance assessment: Needs assistance Sitting-balance support: No upper extremity supported;Feet supported Sitting balance-Leahy Scale: Fair     Standing balance support: Single extremity supported Standing balance-Leahy Scale: Poor Standing balance comment: UE and min guard for static standing                           ADL either performed or assessed with clinical judgement   ADL  Overall ADL's : Needs assistance/impaired Eating/Feeding: NPO   Grooming: Wash/dry hands;Wash/dry face;Oral care;Brushing hair;Set up;Sitting   Upper Body Bathing: Moderate assistance;Sitting   Lower Body Bathing: Maximal assistance;Sit to/from stand   Upper Body Dressing : Maximal assistance;Sitting Upper Body Dressing Details (indicate cue type and reason): due to lines  Lower Body Dressing: Maximal assistance;Sit to/from stand   Toilet Transfer: Minimal assistance;+2 for physical assistance;Ambulation;Comfort height toilet;RW   Toileting- Clothing Manipulation and Hygiene: Maximal assistance;Sit to/from stand       Functional mobility during ADLs: Minimal assistance;+2 for safety/equipment General ADL Comments: Pt limited by lines.  Began instruction with pt and father (with [employer] rep) on switching power supply      Vision         Perception     Praxis      Pertinent Vitals/Pain Pain Assessment: No/denies pain     Hand Dominance     Extremity/Trunk Assessment Upper Extremity Assessment Upper Extremity Assessment: Generalized weakness   Lower Extremity Assessment Lower Extremity Assessment: Defer to PT evaluation   Cervical / Trunk Assessment Cervical / Trunk Assessment: Kyphotic   Communication Communication Communication: No difficulties   Cognition Arousal/Alertness: Awake/alert Behavior During Therapy: WFL for tasks assessed/performed Overall Cognitive Status: Within Functional Limits for tasks assessed  General Comments  VSS    Exercises     Shoulder Instructions      Home Living Family/patient expects to be discharged to:: Private residence Living Arrangements: Parent Available Help at Discharge: Family;Available 24 hours/day Type of Home: House Home Access: Stairs to enter [employer] of Steps: 1   Home Layout: Two level;Able to live on main level with bedroom/bathroom      Bathroom Shower/Tub: Chief Strategy Officer: Standard     Home Equipment: Environmental consultant - 2 wheels;Cane - single point   Additional Comments: Pt lives with father who will be primary caregiver       Prior Functioning/Environment Level of Independence: Independent        Comments: Worked at [employer]  until February        OT Problem List: Decreased strength;Decreased activity tolerance;Impaired balance (sitting and/or standing);Decreased safety awareness;Decreased knowledge of use of DME or AE;Decreased knowledge of precautions;Cardiopulmonary status limiting activity      OT Treatment/Interventions: Self-care/ADL training;DME and/or AE instruction;Energy conservation;Therapeutic activities;Patient/family education;Balance training    OT Goals(Current goals can be found in the care plan section) Acute Rehab OT Goals Patient Stated Goal: return home OT Goal Formulation: With patient Time For Goal Achievement: 06/15/16 Potential to Achieve Goals: Good  OT Frequency: Min 2X/week   Barriers to D/C:            Co-evaluation              AM-PAC PT "6 Clicks" Daily Activity     Outcome Measure Help from another person eating meals?: Total Help from another person taking care of personal grooming?: A Little Help from another person toileting, which includes using toliet, bedpan, or urinal?: A Lot Help from another person bathing (including washing, rinsing, drying)?: A Lot Help from another person to put on and taking off regular upper body clothing?: A Lot Help from another person to put on and taking off regular lower body clothing?: A Lot 6 Click Score: 12   End of Session Equipment Utilized During Treatment: Rolling walker;Oxygen Nurse Communication: Mobility status  Activity Tolerance: Patient tolerated treatment well Patient left: in chair;with call bell/phone within reach;with family/visitor present;with nursing/sitter in room  OT Visit Diagnosis:  Unsteadiness on feet (R26.81);Muscle weakness (generalized) (M62.81)                Time: 1220-1240 OT Time Calculation (min): 20 min Charges:  OT General Charges $OT Visit: 1 Procedure OT Evaluation $OT Eval High Complexity: 1 Procedure G-Codes:     Jeani Hawking, OTR/L [phone]   Jeani Hawking M 06/01/2016, 4:23 PM

## 2016-06-01 NOTE — Progress Notes (Signed)
HVAD  Rounding Note  Postop day 2  Subjective:   61 year old patient with ischemic cardiomyopathy and acute on chronic systolic heart failure who presented with fatigue and failure to thrive despite home IV milrinone and maximal medical therapy including daily drainage of pleural effusion with right Pleurx catheter  The patient was assessed and found to be candidate for destination therapy LVAD implantation. He was optimized with diuresis and Plavix washout prior to surgical implantation  The patient had a stable night with good VAD parameters Inhaled Nitric oxide has been weaned to 3 ppm Patient will be weaned off nitiric and ambulated today  Postoperative coagulopathy he related to preoperative Plavix treated with platelet transfusion and currently hemoglobin is stable.  Expected postop blood loss anemia. Patient started on  Coumadin.  Chest x-ray shows good VAD orientation and no significant pleural effusion. Chest tubes 2 of 3 will be removed   BP high- on lasix drip, prn hydralazine, iv NTG  Good pulsatility on VAD  LVAD INTERROGATION:  VAD:  Flow 4.8liters/min, speed 2800 rpm , power 5.1,   Controller intact  Objective:    Vital Signs:   Temp:  [98 F (36.7 C)-99.3 F (37.4 C)] 98 F (36.7 C) (05/31 0801) Pulse Rate:  [71-106] 92 (05/31 0700) Resp:  [18-20] 18 (05/30 1200) BP: (90-122)/(61-99) 109/79 (05/31 0700) SpO2:  [96 %-100 %] 100 % (05/31 0700) Arterial Line BP: (86-129)/(61-83) 97/66 (05/31 0700) FiO2 (%):  [40 %-94 %] 94 % (05/30 0900) Weight:  [219 lb 9.3 oz (99.6 kg)] 219 lb 9.3 oz (99.6 kg) (05/31 0500) Last BM Date: 05/27/16 Mean arterial Pressure 85 mmHg  Intake/Output:   Intake/Output Summary (Last 24 hours) at 06/01/16 0806 Last data filed at 06/01/16 0600  Gross per 24 hour  Intake          1712.18 ml  Output             2050 ml  Net          -337.82 ml     Physical Exam: General:  Well appearing. No resp difficulty OOB in chair HEENT:  normal Neck: supple. JVP . Carotids 2+ bilat; no bruits. No lymphadenopathy or thryomegaly appreciated. Cor: Mechanical heart sounds with LVAD hum present. Lungs: clear Abdomen: soft, nontender, nondistended. No hepatosplenomegaly. No bruits or masses. Good bowel sounds. Extremities: no cyanosis, clubbing, rash, edema Neuro: alert & orientedx3, cranial nerves grossly intact. moves all 4 extremities w/o difficulty. Affect pleasant  Telemetry:paced 86  Labs: Basic Metabolic Panel:  Recent Labs Lab 05/30/16 0310  05/30/16 1300 05/30/16 1303 05/30/16 1855 05/30/16 1858 05/31/16 0228 05/31/16 1607 06/01/16 0500  NA 131*  < > 135 138  --  135 135 126* 130*  K 3.3*  < > 3.3* 3.5  --  4.2 4.1 3.6 3.9  CL 95*  < > 101  --   --  98* 101 95* 96*  CO2 30  --  27  --   --   --  27 25 26   GLUCOSE 169*  < > 119* 127*  --  117* 108* 120* 203*  BUN 11  < > 8  --   --  8 8 8 12   CREATININE 0.67  < > 0.54*  --  0.58* 0.50* 0.66 0.73 0.78  CALCIUM 8.5*  --  7.9*  --   --   --  8.0* 7.8* 8.1*  MG  --   --  1.7  --  2.8*  --  2.4 2.1 2.0  PHOS  --   --   --   --   --   --  3.5  --  3.9  < > = values in this interval not displayed.  Liver Function Tests:  Recent Labs Lab 05/31/16 0228 06/01/16 0500  AST 49* 41  ALT 14* 17  ALKPHOS 51 67  BILITOT 1.7* 1.9*  PROT 5.3* 5.6*  ALBUMIN 2.9* 2.7*   No results for input(s): LIPASE, AMYLASE in the last 168 hours.  Recent Labs Lab 05/25/16 0824  AMMONIA 16    CBC:  Recent Labs Lab 05/30/16 1300  05/30/16 1415 05/30/16 1855 05/30/16 1858 05/31/16 0228 05/31/16 1607 06/01/16 0500  WBC 13.3*  --   --  11.4*  --  10.6* 9.8 10.8*  NEUTROABS  --   --   --   --   --  8.4*  --  8.9*  HGB 7.8*  < >  --  8.7* 8.2* 8.6* 7.8* 7.9*  HCT 23.7*  < >  --  26.8* 24.0* 26.0* 23.9* 24.1*  MCV 87.1  --   --  86.5  --  86.4 86.6 86.4  PLT 158  --  150 172  --  145* 125* 123*  < > = values in this interval not displayed.  INR:  Recent Labs Lab  05/30/16 1300 05/30/16 1415 05/31/16 0228 06/01/16 0500  INR 1.38 1.36 1.19 1.21    Other results:  EKG:   Imaging: Dg Chest Port 1 View  Result Date: 05/31/2016 CLINICAL DATA:  Hypoxia EXAM: PORTABLE CHEST 1 VIEW COMPARISON:  May 30, 2016 FINDINGS: Endotracheal tube tip is 5.3 cm above the carina. Nasogastric tube tip and side port are below the diaphragm. Swan-Ganz catheter tip is in the right main pulmonary artery. There is a chest tube on the right as well as a mediastinal drain, stable. There is a left ventricular assist device as well as pacemaker leads attached to the right atrium and right ventricle. No pneumothorax. There is consolidation in the left lower lobe with small left pleural effusion, increased from 1 day prior. There is also a right pleural effusion with right base atelectasis, essentially stable. Heart remains enlarged with pulmonary venous hypertension. No adenopathy. There is aortic atherosclerosis. No bone lesions. IMPRESSION: Tube and catheter positions as described without appreciable pneumothorax. Increase in consolidation left lower lobe with small left pleural effusion. Question atelectasis with possible superimposed pneumonia in the left base. There is evidence of a degree of underlying congestive heart failure. Persistent right pleural effusion with right base atelectasis, unchanged. There is aortic atherosclerosis. Electronically Signed   By: Bretta Bang III M.D.   On: 05/31/2016 07:40   Dg Chest Portable 1 View  Result Date: 05/30/2016 CLINICAL DATA:  Pneumothorax. EXAM: PORTABLE CHEST 1 VIEW COMPARISON:  05/27/2016. FINDINGS: Endotracheal tube noted with its tip 3 cm above the carina. NG tube noted with tip below left hemidiaphragm, right IJ line in stable position. Swan-Ganz catheter tip in the pulmonary outflow tract. Mediastinal drainage catheters in good anatomic position . Right chest tube noted with its tip over the right upper chest. No pneumothorax.  Bilateral atelectatic changes, improved from prior exam. Small right pleural effusion. Cardiac pacer in stable position. Left ventricular assist device noted over the left lower chest. IMPRESSION: 1. Two positioning including right chest tube as above. No pneumothorax. Interim improvement of bilateral atelectasis. Interim improvement right pleural effusion . 2. Cardiac pacer in stable position. Left ventricular  assist device noted over the left lower chest. Electronically Signed   By: Maisie Fus  Register   On: 05/30/2016 12:55     Medications:     Scheduled Medications: . acetaminophen  1,000 mg Oral Q6H   Or  . acetaminophen (TYLENOL) oral liquid 160 mg/5 mL  1,000 mg Per Tube Q6H  . amiodarone  200 mg Oral BID  . aspirin EC  325 mg Oral Daily   Or  . aspirin  324 mg Per Tube Daily   Or  . aspirin  300 mg Rectal Daily  . bisacodyl  10 mg Oral Daily   Or  . bisacodyl  10 mg Rectal Daily  . docusate sodium  200 mg Oral Daily  . feeding supplement  1 Container Oral TID BM  . ferrous fumarate-b12-vitamic C-folic acid  1 capsule Oral BID BM  . insulin aspart  0-24 Units Subcutaneous Q4H  . insulin detemir  20 Units Subcutaneous BID  . mouth rinse  15 mL Mouth Rinse BID  . metoCLOPramide (REGLAN) injection  10 mg Intravenous Q6H  . pantoprazole  40 mg Oral Daily  . sildenafil  20 mg Oral TID  . sodium chloride flush  3 mL Intravenous Q12H  . warfarin  5 mg Oral q1800  . Warfarin - Physician Dosing Inpatient   Does not apply q1800    Infusions: . sodium chloride Stopped (05/31/16 1800)  . sodium chloride    . sodium chloride 20 mL (05/30/16 2201)  . sodium chloride    . furosemide (LASIX) infusion 10 mg/hr (06/01/16 0734)  . lactated ringers 20 mL/hr at 06/01/16 0600  . lactated ringers 20 mL/hr at 05/31/16 1900  . milrinone 0.25 mcg/kg/min (06/01/16 0600)  . vancomycin      PRN Medications: sodium chloride, fentaNYL (SUBLIMAZE) injection, hydrALAZINE, midazolam, ondansetron  (ZOFRAN) IV, oxyCODONE, sodium chloride flush, traMADol   Assessment:  Acute on chronic systolic heart failure from ischemic cardiomyopathy Previous placement of AICD pacemaker system Preoperative pulmonary  hypertension treated with sildenafil  Recent smoker history   Plan/Discussion:    Patient showing excellent early recovery, neurologically   intact ready to ambulate with PT Needs diuresis and BP control Coumadin started Continue milrinone at 0.25 for  RV function today Remove 2 chest tubes I reviewed the LVAD parameters from today, and compared the results to the patient's prior recorded data.  No programming changes were made.  The LVAD is functioning within specified parameters.  The patient performs LVAD self-test daily.  LVAD interrogation was negative for any significant power changes, alarms or PI events/speed drops.  LVAD equipment check completed and is in good working order.  Back-up equipment present.   LVAD education done on emergency procedures and precautions and reviewed exit site care.  Length of Stay: 8  Kathlee Nations Trigt III 06/01/2016, 8:06 AM

## 2016-06-01 NOTE — Progress Notes (Signed)
HVAD  Rounding Note  Subjective:    Post Op Day 2 HVAD   Remains on milrinone 0.25 mcg and NO 3 PPM and amio po.  Overnight hypertensive and CVP was up. Started on nitro drip short term and placed on lasix drip.   Overall feeling ok. Sore. Denies SOB.   Co-ox 79%, CVP down to 4.   LDH 254  LVAD INTERROGATION:  HVAD:  Flow  5.1 liters/min, RPM 2800  Watts 4.7.  Peak 7.3, trough 3.3  Objective:    Vital Signs:   Temp:  [98.2 F (36.8 C)-99.3 F (37.4 C)] 98.2 F (36.8 C) (05/31 0400) Pulse Rate:  [71-106] 92 (05/31 0700) Resp:  [18-20] 18 (05/30 1200) BP: (90-122)/(61-99) 109/79 (05/31 0700) SpO2:  [96 %-100 %] 100 % (05/31 0700) Arterial Line BP: (86-129)/(61-83) 97/66 (05/31 0700) FiO2 (%):  [40 %-94 %] 94 % (05/30 0900) Weight:  [219 lb 9.3 oz (99.6 kg)] 219 lb 9.3 oz (99.6 kg) (05/31 0500) Last BM Date: 05/27/16 Mean arterial Pressure 70-80s   Intake/Output:   Intake/Output Summary (Last 24 hours) at 06/01/16 0801 Last data filed at 06/01/16 0600  Gross per 24 hour  Intake          1712.18 ml  Output             2050 ml  Net          -337.82 ml     Physical Exam: CVP 4 GENERAL:NAD. Sitting in the chair  HEENT: normal  NECK: Supple, JVP 5-6.  2+ bilaterally, no bruits.  No lymphadenopathy or thyromegaly appreciated.  RIJ  CARDIAC:  Mechanical heart sounds with LVAD hum present.  LUNGS:  Clear to auscultation bilaterally.  ABDOMEN:  Soft, round, nontender, positive bowel sounds x4.     LVAD exit site: well-healed and incorporated.  Dressing dry and intact.  No erythema or drainage.  Stabilization device present and accurately applied.  Driveline dressing is being changed daily per sterile technique. EXTREMITIES:  Warm and dry, no cyanosis, clubbing, rash or edema  NEUROLOGIC:  Alert and oriented x 4.  Gait steady.  No aphasia.  No dysarthria.  Affect pleasant.  SKIN: R and LLE hyperpigmentation noted    Telemetry: A paced 80s   Labs: Basic Metabolic  Panel:  Recent Labs Lab 05/30/16 0310  05/30/16 1300 05/30/16 1303 05/30/16 1855 05/30/16 1858 05/31/16 0228 05/31/16 1607 06/01/16 0500  NA 131*  < > 135 138  --  135 135 126* 130*  K 3.3*  < > 3.3* 3.5  --  4.2 4.1 3.6 3.9  CL 95*  < > 101  --   --  98* 101 95* 96*  CO2 30  --  27  --   --   --  27 25 26   GLUCOSE 169*  < > 119* 127*  --  117* 108* 120* 203*  BUN 11  < > 8  --   --  8 8 8 12   CREATININE 0.67  < > 0.54*  --  0.58* 0.50* 0.66 0.73 0.78  CALCIUM 8.5*  --  7.9*  --   --   --  8.0* 7.8* 8.1*  MG  --   --  1.7  --  2.8*  --  2.4 2.1 2.0  PHOS  --   --   --   --   --   --  3.5  --  3.9  < > = values in this interval not  displayed.  Liver Function Tests:  Recent Labs Lab 05/31/16 0228 06/01/16 0500  AST 49* 41  ALT 14* 17  ALKPHOS 51 67  BILITOT 1.7* 1.9*  PROT 5.3* 5.6*  ALBUMIN 2.9* 2.7*   No results for input(s): LIPASE, AMYLASE in the last 168 hours.  Recent Labs Lab 05/25/16 0824  AMMONIA 16    CBC:  Recent Labs Lab 05/30/16 1300  05/30/16 1415 05/30/16 1855 05/30/16 1858 05/31/16 0228 05/31/16 1607 06/01/16 0500  WBC 13.3*  --   --  11.4*  --  10.6* 9.8 10.8*  NEUTROABS  --   --   --   --   --  8.4*  --  8.9*  HGB 7.8*  < >  --  8.7* 8.2* 8.6* 7.8* 7.9*  HCT 23.7*  < >  --  26.8* 24.0* 26.0* 23.9* 24.1*  MCV 87.1  --   --  86.5  --  86.4 86.6 86.4  PLT 158  --  150 172  --  145* 125* 123*  < > = values in this interval not displayed.  INR:  Recent Labs Lab 05/30/16 1300 05/30/16 1415 05/31/16 0228 06/01/16 0500  INR 1.38 1.36 1.19 1.21    Other results:  EKG:   Imaging: Dg Chest Port 1 View  Result Date: 05/31/2016 CLINICAL DATA:  Hypoxia EXAM: PORTABLE CHEST 1 VIEW COMPARISON:  May 30, 2016 FINDINGS: Endotracheal tube tip is 5.3 cm above the carina. Nasogastric tube tip and side port are below the diaphragm. Swan-Ganz catheter tip is in the right main pulmonary artery. There is a chest tube on the right as well as a  mediastinal drain, stable. There is a left ventricular assist device as well as pacemaker leads attached to the right atrium and right ventricle. No pneumothorax. There is consolidation in the left lower lobe with small left pleural effusion, increased from 1 day prior. There is also a right pleural effusion with right base atelectasis, essentially stable. Heart remains enlarged with pulmonary venous hypertension. No adenopathy. There is aortic atherosclerosis. No bone lesions. IMPRESSION: Tube and catheter positions as described without appreciable pneumothorax. Increase in consolidation left lower lobe with small left pleural effusion. Question atelectasis with possible superimposed pneumonia in the left base. There is evidence of a degree of underlying congestive heart failure. Persistent right pleural effusion with right base atelectasis, unchanged. There is aortic atherosclerosis. Electronically Signed   By: Bretta BangWilliam  Woodruff III M.D.   On: 05/31/2016 07:40   Dg Chest Portable 1 View  Result Date: 05/30/2016 CLINICAL DATA:  Pneumothorax. EXAM: PORTABLE CHEST 1 VIEW COMPARISON:  05/27/2016. FINDINGS: Endotracheal tube noted with its tip 3 cm above the carina. NG tube noted with tip below left hemidiaphragm, right IJ line in stable position. Swan-Ganz catheter tip in the pulmonary outflow tract. Mediastinal drainage catheters in good anatomic position . Right chest tube noted with its tip over the right upper chest. No pneumothorax. Bilateral atelectatic changes, improved from prior exam. Small right pleural effusion. Cardiac pacer in stable position. Left ventricular assist device noted over the left lower chest. IMPRESSION: 1. Two positioning including right chest tube as above. No pneumothorax. Interim improvement of bilateral atelectasis. Interim improvement right pleural effusion . 2. Cardiac pacer in stable position. Left ventricular assist device noted over the left lower chest. Electronically Signed    By: Maisie Fushomas  Register   On: 05/30/2016 12:55     Medications:     Scheduled Medications: . acetaminophen  1,000 mg  Oral Q6H   Or  . acetaminophen (TYLENOL) oral liquid 160 mg/5 mL  1,000 mg Per Tube Q6H  . amiodarone  200 mg Oral BID  . aspirin EC  325 mg Oral Daily   Or  . aspirin  324 mg Per Tube Daily   Or  . aspirin  300 mg Rectal Daily  . bisacodyl  10 mg Oral Daily   Or  . bisacodyl  10 mg Rectal Daily  . docusate sodium  200 mg Oral Daily  . feeding supplement  1 Container Oral TID BM  . ferrous fumarate-b12-vitamic C-folic acid  1 capsule Oral BID BM  . insulin aspart  0-24 Units Subcutaneous Q4H  . insulin detemir  20 Units Subcutaneous BID  . mouth rinse  15 mL Mouth Rinse BID  . metoCLOPramide (REGLAN) injection  10 mg Intravenous Q6H  . pantoprazole  40 mg Oral Daily  . sodium chloride flush  3 mL Intravenous Q12H  . warfarin  5 mg Oral q1800  . Warfarin - Physician Dosing Inpatient   Does not apply q1800    Infusions: . sodium chloride Stopped (05/31/16 1800)  . sodium chloride    . sodium chloride 20 mL (05/30/16 2201)  . sodium chloride    . dexmedetomidine Stopped (05/31/16 0752)  . furosemide (LASIX) infusion 10 mg/hr (06/01/16 0734)  . insulin (NOVOLIN-R) infusion Stopped (05/31/16 1405)  . lactated ringers 20 mL/hr at 06/01/16 0600  . lactated ringers 20 mL/hr at 05/31/16 1900  . milrinone 0.25 mcg/kg/min (06/01/16 0600)  . nitroGLYCERIN 20 mcg/min (06/01/16 0624)    PRN Medications: sodium chloride, fentaNYL (SUBLIMAZE) injection, hydrALAZINE, midazolam, ondansetron (ZOFRAN) IV, oxyCODONE, sodium chloride flush, traMADol   Assessment/Plan/Discussion   1. 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 Off all drips except milrinone 0.25 mcg. CO-OX 79%.  CVP up over night. Started on lasix drip 10 mg per hour, now CVP down to 4.   - Continue 324 mg aspirin + coumadin. INR per Dr Donata Clay.  - Continue current milrinone for today, then wean.  - On  Lasix gtt, likely will be able to come off this soon with CVP falling.  2. A/C Systolic Heart Failure: 05/26/16 ECHO EF 20-25%.  - CVP up to 16 over night. Started on lasix drip. Renal function stable. CVP now coming down.  3. R Pleural Effusion: R pleurX catheter 4. CAD: 3V CAD S/P stent Prox LAD and PTCA to subtotally occluded OMI 01/2016  5. Pulmonary HTN: PA catheter out.  PA pressures were lower post-LVAD.  - Weaning off NO today.   - Pre-op, he was on Revatio 20 mg tid.  Will restart this for now as he comes off NO.  May be able to stop in future.   6. Former Smoker.   7. HTN: over night he was on ntg drip. Resolved this morning. Would not use NTG drip with addition of revatio.   I reviewed the HVAD parameters from today, and compared the results to the patient's prior recorded data.  No programming changes were made.  The HVAD is functioning within specified parameters.    Length of Stay: 8  Amy Clegg NP-C  06/01/2016, 8:01 AM  VAD Team --- VAD ISSUES ONLY--- Pager [phone] (7am - 7am)  Patient seen with NP, agree with the above note.  He is doing well today, progressing post-op.   Continue milrinone today, wean tomorrow.  On Lasix gtt but likely will be able to stop soon.  Wean NO and restart Revatio 20 mg tid.   HVAD parameters stable.   Mobilized.   Marca Ancona 06/01/2016 8:23 AM

## 2016-06-01 NOTE — Evaluation (Signed)
Physical Therapy Evaluation Patient Details Name: Dalton RainwaterLarry D [name] MRN: [id number] DOB: [birth year] Today's Date: 06/01/2016   History of Present Illness  Pt adm with  Acute on chronic systolic CHF due to ICM and plans for LVAD after insurance approval. 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 PMH - Rt pleural effusion with pleurx, CHF, DM, HTN, CAD, PVD. BLE bypass grafts, AICD  Clinical Impression  Pt admitted with above diagnosis and presents to PT with functional limitations due to deficits listed below (See PT problem list). Pt needs skilled PT to maximize independence and safety to allow discharge to home with father. Pt doing well post op and expect mobility will progress well. Father and [employer] reps present. [employer] reps providing instruction to pt and father for [facility] power to battery power.      Follow Up Recommendations No PT follow up    Equipment Recommendations  Other (comment) (rollator)    Recommendations for Other Services       Precautions / Restrictions Precautions Precautions: Sternal;Fall      Mobility  Bed Mobility               General bed mobility comments: Pt up in chair  Transfers Overall transfer level: Needs assistance Equipment used: Pushed w/c Transfers: Sit to/from Stand Sit to Stand: +2 physical assistance;Min assist         General transfer comment: Verbal cues for technique. Assist to bring hips up and for balance  Ambulation/Gait Ambulation/Gait assistance: Min guard;+2 safety/equipment Ambulation Distance (Feet): 300 Feet Assistive device: Pushed wheelchair Gait Pattern/deviations: Step-through pattern;Decreased stride length Gait velocity: decr Gait velocity interpretation: Below normal speed for age/gender General Gait Details: Assist for safety and support. Pt amb on 2L of O2. VSS throughout.  Stairs            Wheelchair Mobility    Modified Rankin (Stroke Patients Only)       Balance Overall  balance assessment: Needs assistance Sitting-balance support: No upper extremity supported;Feet supported Sitting balance-Leahy Scale: Fair     Standing balance support: Single extremity supported Standing balance-Leahy Scale: Poor Standing balance comment: UE and min guard for static standing                             Pertinent Vitals/Pain Pain Assessment: No/denies pain    Home Living Family/patient expects to be discharged to:: Private residence Living Arrangements: Parent Available Help at Discharge: Family;Available 24 hours/day Type of Home: House Home Access: Stairs to enter   [employer] Corporation[employer]trance Stairs-Number of Steps: 1 Home Layout: Two level;Able to live on main level with bedroom/bathroom Home Equipment: Dan HumphreysWalker - 2 wheels;Cane - single point      Prior Function Level of Independence: Independent         Comments: Worked at [employer] until February     Hand Dominance        Extremity/Trunk Assessment   Upper Extremity Assessment Upper Extremity Assessment: Defer to OT evaluation    Lower Extremity Assessment Lower Extremity Assessment: Generalized weakness       Communication   Communication: No difficulties  Cognition Arousal/Alertness: Awake/alert Behavior During Therapy: WFL for tasks assessed/performed Overall Cognitive Status: Within Functional Limits for tasks assessed                                        General  Comments      Exercises     Assessment/Plan    PT Assessment Patient needs continued PT services  PT Problem List Decreased strength;Decreased activity tolerance;Decreased mobility;Decreased balance;Decreased knowledge of use of DME       PT Treatment Interventions DME instruction;Gait training;Stair training;Functional mobility training;Therapeutic activities;Therapeutic exercise;Balance training;Patient/family education    PT Goals (Current goals can be found in the Care Plan section)  Acute Rehab PT  Goals Patient Stated Goal: return home PT Goal Formulation: With patient Time For Goal Achievement: 06/08/16 Potential to Achieve Goals: Good    Frequency Min 3X/week   Barriers to discharge        Co-evaluation               AM-PAC PT "6 Clicks" Daily Activity  Outcome Measure Difficulty turning over in bed (including adjusting bedclothes, sheets and blankets)?: Total Difficulty moving from lying on back to sitting on the side of the bed? : Total Difficulty sitting down on and standing up from a chair with arms (e.g., wheelchair, bedside commode, etc,.)?: Total Help needed moving to and from a bed to chair (including a wheelchair)?: A Lot Help needed walking in hospital room?: A Little Help needed climbing 3-5 steps with a railing? : A Lot 6 Click Score: 10    End of Session Equipment Utilized During Treatment: Oxygen Activity Tolerance: Patient tolerated treatment well Patient left: in chair;with call bell/phone within reach;with family/visitor present Nurse Communication: Mobility status PT Visit Diagnosis: Unsteadiness on feet (R26.81);Difficulty in walking, not elsewhere classified (R26.2)    Time: 1610-9604 PT Time Calculation (min) (ACUTE ONLY): 25 min   Charges:   PT Evaluation $PT Eval High Complexity: 1 Procedure     PT G CodesMarland Kitchen        [facility] [phone]   Angelina Ok [facility] 06/01/2016, 2:52 PM

## 2016-06-01 NOTE — Progress Notes (Signed)
CT surgery p.m. Rounds  Patient had good day walking hallway-300 feet  2 chest tubes removed Poor status improved now off  inhaled nitric oxide Excellent diuresis after conversion to Lasix drip Blood pressure trending higher-sildenafil resumed VAD parameters are satisfactory Blood sugar this evening 375-insulin coverage increased

## 2016-06-02 ENCOUNTER — Other Ambulatory Visit: Payer: Self-pay

## 2016-06-02 ENCOUNTER — Encounter (HOSPITAL_COMMUNITY): Payer: BLUE CROSS/BLUE SHIELD

## 2016-06-02 ENCOUNTER — Inpatient Hospital Stay (HOSPITAL_COMMUNITY): Payer: BLUE CROSS/BLUE SHIELD

## 2016-06-02 LAB — TYPE AND SCREEN
ABO/RH(D): A POS
Antibody Screen: NEGATIVE
Unit division: 0
Unit division: 0
Unit division: 0
Unit division: 0

## 2016-06-02 LAB — GLUCOSE, CAPILLARY
GLUCOSE-CAPILLARY: 114 mg/dL — AB (ref 65–99)
GLUCOSE-CAPILLARY: 86 mg/dL (ref 65–99)
GLUCOSE-CAPILLARY: 148 mg/dL — AB (ref 65–99)
GLUCOSE-CAPILLARY: 170 mg/dL — AB (ref 65–99)
GLUCOSE-CAPILLARY: 227 mg/dL — AB (ref 65–99)
GLUCOSE-CAPILLARY: 278 mg/dL — AB (ref 65–99)
GLUCOSE-CAPILLARY: 279 mg/dL — AB (ref 65–99)
GLUCOSE-CAPILLARY: 202 mg/dL — AB (ref 65–99)
Glucose-Capillary: 161 mg/dL — ABNORMAL HIGH (ref 65–99)
Glucose-Capillary: 107 mg/dL — ABNORMAL HIGH (ref 65–99)
Glucose-Capillary: 202 mg/dL — ABNORMAL HIGH (ref 65–99)
Glucose-Capillary: 158 mg/dL — ABNORMAL HIGH (ref 65–99)
Glucose-Capillary: 106 mg/dL — ABNORMAL HIGH (ref 65–99)

## 2016-06-02 LAB — CBC WITH DIFFERENTIAL/PLATELET
Basophils Absolute: 0 10*3/uL (ref 0.0–0.1)
Basophils Relative: 0 %
Eosinophils Absolute: 0.1 10*3/uL (ref 0.0–0.7)
Eosinophils Relative: 1 %
HCT: 25 % — ABNORMAL LOW (ref 39.0–52.0)
Hemoglobin: 8.2 g/dL — ABNORMAL LOW (ref 13.0–17.0)
Lymphocytes Relative: 9 %
Lymphs Abs: 0.7 10*3/uL (ref 0.7–4.0)
MCH: 28.7 pg (ref 26.0–34.0)
MCHC: 32.8 g/dL (ref 30.0–36.0)
MCV: 87.4 fL (ref 78.0–100.0)
Monocytes Absolute: 0.8 10*3/uL (ref 0.1–1.0)
Monocytes Relative: 9 %
Neutro Abs: 7.1 10*3/uL (ref 1.7–7.7)
Neutrophils Relative %: 81 %
Platelets: 141 10*3/uL — ABNORMAL LOW (ref 150–400)
RBC: 2.86 MIL/uL — ABNORMAL LOW (ref 4.22–5.81)
RDW: 16.5 % — ABNORMAL HIGH (ref 11.5–15.5)
WBC: 8.7 10*3/uL (ref 4.0–10.5)

## 2016-06-02 LAB — BPAM RBC
Blood Product Expiration Date: 201806092359
Blood Product Expiration Date: 201806102359
Blood Product Expiration Date: 201806102359
Blood Product Expiration Date: 201806102359
ISSUE DATE / TIME: 201805290708
ISSUE DATE / TIME: 201805290708
ISSUE DATE / TIME: 201805290708
ISSUE DATE / TIME: 201805291504
Unit Type and Rh: 6200
Unit Type and Rh: 6200
Unit Type and Rh: 6200
Unit Type and Rh: 6200

## 2016-06-02 LAB — COMPREHENSIVE METABOLIC PANEL
ALT: 15 U/L — ABNORMAL LOW (ref 17–63)
AST: 23 U/L (ref 15–41)
Albumin: 2.4 g/dL — ABNORMAL LOW (ref 3.5–5.0)
Alkaline Phosphatase: 64 U/L (ref 38–126)
Anion gap: 8 (ref 5–15)
BUN: 12 mg/dL (ref 6–20)
CO2: 29 mmol/L (ref 22–32)
Calcium: 8.3 mg/dL — ABNORMAL LOW (ref 8.9–10.3)
Chloride: 98 mmol/L — ABNORMAL LOW (ref 101–111)
Creatinine, Ser: 0.69 mg/dL (ref 0.61–1.24)
GFR calc Af Amer: >60 mL/min (ref >60)
GFR calc non Af Amer: >60 mL/min (ref >60)
Glucose, Bld: 104 mg/dL — ABNORMAL HIGH (ref 65–99)
Potassium: 3.2 mmol/L — ABNORMAL LOW (ref 3.5–5.1)
Sodium: 135 mmol/L (ref 135–145)
Total Bilirubin: 1.4 mg/dL — ABNORMAL HIGH (ref 0.3–1.2)
Total Protein: 5.7 g/dL — ABNORMAL LOW (ref 6.5–8.1)

## 2016-06-02 LAB — COOXEMETRY PANEL
Carboxyhemoglobin: 2.2 % — ABNORMAL HIGH (ref 0.5–1.5)
Methemoglobin: 1.3 % (ref 0.0–1.5)
O2 Saturation: 86.9 %
Total hemoglobin: 4.9 g/dL — CL (ref 12.0–16.0)

## 2016-06-02 LAB — PREPARE RBC (CROSSMATCH)

## 2016-06-02 LAB — POCT I-STAT, CHEM 8
BUN: 13 mg/dL (ref 6–20)
CREATININE: 0.7 mg/dL (ref 0.61–1.24)
Calcium, Ion: 1.12 mmol/L — ABNORMAL LOW (ref 1.15–1.40)
Chloride: 92 mmol/L — ABNORMAL LOW (ref 101–111)
Glucose, Bld: 205 mg/dL — ABNORMAL HIGH (ref 65–99)
HEMATOCRIT: 26 % — AB (ref 39.0–52.0)
Hemoglobin: 8.8 g/dL — ABNORMAL LOW (ref 13.0–17.0)
POTASSIUM: 3.6 mmol/L (ref 3.5–5.1)
Sodium: 134 mmol/L — ABNORMAL LOW (ref 135–145)
TCO2: 29 mmol/L (ref 0–100)

## 2016-06-02 LAB — PROTIME-INR
INR: 1.22
Prothrombin Time: 15.4 seconds — ABNORMAL HIGH (ref 11.4–15.2)

## 2016-06-02 LAB — LACTATE DEHYDROGENASE: LDH: 245 U/L — ABNORMAL HIGH (ref 98–192)

## 2016-06-02 LAB — MAGNESIUM: Magnesium: 1.8 mg/dL (ref 1.7–2.4)

## 2016-06-02 MED ORDER — POTASSIUM CHLORIDE 10 MEQ/50ML IV SOLN
10.0000 meq | INTRAVENOUS | Status: AC
  Administered 2016-06-02 (×3): 10 meq via INTRAVENOUS
  Filled 2016-06-02 (×3): qty 50

## 2016-06-02 MED ORDER — INSULIN DETEMIR 100 UNIT/ML ~~LOC~~ SOLN: 30.0000 [IU] | Freq: Two times a day (BID) | SUBCUTANEOUS | Status: DC

## 2016-06-02 MED ORDER — INSULIN ASPART 100 UNIT/ML ~~LOC~~ SOLN
0.0000 [IU] | SUBCUTANEOUS | Status: DC
  Administered 2016-06-02: 12 [IU] via SUBCUTANEOUS
  Administered 2016-06-02: 2 [IU] via SUBCUTANEOUS
  Administered 2016-06-02: 8 [IU] via SUBCUTANEOUS
  Administered 2016-06-03: 2 [IU] via SUBCUTANEOUS
  Administered 2016-06-03 – 2016-06-04 (×2): 4 [IU] via SUBCUTANEOUS
  Administered 2016-06-04: 2 [IU] via SUBCUTANEOUS
  Administered 2016-06-04: 4 [IU] via SUBCUTANEOUS
  Administered 2016-06-04: 2 [IU] via SUBCUTANEOUS
  Administered 2016-06-05: 4 [IU] via SUBCUTANEOUS

## 2016-06-02 MED ORDER — SPIRONOLACTONE 25 MG PO TABS
25.0000 mg | ORAL_TABLET | Freq: Every day | ORAL | Status: DC
  Administered 2016-06-02 – 2016-06-11 (×10): 25 mg via ORAL
  Filled 2016-06-02 (×11): qty 1

## 2016-06-02 MED ORDER — SODIUM CHLORIDE 0.9 % IV SOLN
INTRAVENOUS | Status: DC
  Administered 2016-06-02: 2 [IU]/h via INTRAVENOUS
  Filled 2016-06-02 (×2): qty 1

## 2016-06-02 MED ORDER — FENTANYL CITRATE (PF) 100 MCG/2ML IJ SOLN: 25.0000 ug | INTRAMUSCULAR | Status: DC | PRN

## 2016-06-02 MED ORDER — LOSARTAN POTASSIUM 25 MG PO TABS
25.0000 mg | ORAL_TABLET | Freq: Every day | ORAL | Status: DC
  Administered 2016-06-02: 25 mg via ORAL
  Filled 2016-06-02: qty 1

## 2016-06-02 MED ORDER — INSULIN ASPART 100 UNIT/ML ~~LOC~~ SOLN
4.0000 [IU] | Freq: Three times a day (TID) | SUBCUTANEOUS | Status: DC
  Administered 2016-06-02 – 2016-06-03 (×4): 4 [IU] via SUBCUTANEOUS

## 2016-06-02 NOTE — Progress Notes (Addendum)
HVAD  Rounding Note  Subjective:    Post Op Day 3 HVAD   Received 1UPRBC late last night. Yesterday revatio and spiro added. Remains on milrinone 0.25 mcg. NO weaned off.   Co-ox 87%. CVP 12-13 personally checked.    Overall feeling ok. Pain 3/10. Ambulated 300 feet.   Hgb 7.5>8.2  LDH 245  LVAD INTERROGATION:  HVAD:  Flow  5.3 liters/min, RPM 2800  Watts 4.8.  Peak 7.3, trough 3.6   Objective:    Vital Signs:   Temp:  [98 F (36.7 C)-99.2 F (37.3 C)] 98.5 F (36.9 C) (06/01 0400) Pulse Rate:  [72-111] 104 (06/01 0700) Resp:  [18] 18 (06/01 0145) BP: (83-130)/(51-97) 111/91 (06/01 0611) SpO2:  [97 %-100 %] 99 % (06/01 0700) Weight:  [214 lb 11.2 oz (97.4 kg)] 214 lb 11.2 oz (97.4 kg) (06/01 0400) Last BM Date: 06/01/16 Mean arterial Pressure 70-80s   Intake/Output:   Intake/Output Summary (Last 24 hours) at 06/02/16 0800 Last data filed at 06/02/16 0700  Gross per 24 hour  Intake          1419.94 ml  Output             2680 ml  Net         -1260.06 ml     Physical Exam: CVP 12-13 GENERAL: Sitting in the chair. NAD  HEENT: normal  NECK: Supple, JVP ~10 .  2+ bilaterally, no bruits.  No lymphadenopathy or thyromegaly appreciated.   CARDIAC:  Mechanical heart sounds with LVAD hum present. Sternal dressing intact  LUNGS:  Clear to auscultation bilaterally.  ABDOMEN:  Soft, round, nontender, positive bowel sounds x4.     LVAD exit site:  Dressing dry and intact.   EXTREMITIES:  Warm and dry, no cyanosis, clubbing, rash or edema with hyperpigmentation noted.  NEUROLOGIC:  Alert and oriented x 4.  Gait steady.  No aphasia.  No dysarthria.  Affect pleasant.     Telemetry: Sinus Tach 100s with PVCs.    Labs: Basic Metabolic Panel:  Recent Labs Lab 05/30/16 1300  05/30/16 1855  05/31/16 0228 05/31/16 1607 06/01/16 0500 06/01/16 1638 06/02/16 0453  NA 135  < >  --   < > 135 126* 130* 131* 135  K 3.3*  < >  --   < > 4.1 3.6 3.9 3.6 3.2*  CL 101  --   --   <  > 101 95* 96* 91* 98*  CO2 27  --   --   --  27 25 26   --  29  GLUCOSE 119*  < >  --   < > 108* 120* 203* 376* 104*  BUN 8  --   --   < > 8 8 12 15 12   CREATININE 0.54*  --  0.58*  < > 0.66 0.73 0.78 0.70 0.69  CALCIUM 7.9*  --   --   --  8.0* 7.8* 8.1*  --  8.3*  MG 1.7  --  2.8*  --  2.4 2.1 2.0  --  1.8  PHOS  --   --   --   --  3.5  --  3.9  --   --   < > = values in this interval not displayed.  Liver Function Tests:  Recent Labs Lab 05/31/16 0228 06/01/16 0500 06/02/16 0453  AST 49* 41 23  ALT 14* 17 15*  ALKPHOS 51 67 64  BILITOT 1.7* 1.9* 1.4*  PROT 5.3* 5.6*  5.7*  ALBUMIN 2.9* 2.7* 2.4*   No results for input(s): LIPASE, AMYLASE in the last 168 hours. No results for input(s): AMMONIA in the last 168 hours.  CBC:  Recent Labs Lab 05/30/16 1855  05/31/16 0228 05/31/16 1607 06/01/16 0500 06/01/16 1638 06/02/16 0453  WBC 11.4*  --  10.6* 9.8 10.8*  --  8.7  NEUTROABS  --   --  8.4*  --  8.9*  --  7.1  HGB 8.7*  < > 8.6* 7.8* 7.9* 7.5* 8.2*  HCT 26.8*  < > 26.0* 23.9* 24.1* 22.0* 25.0*  MCV 86.5  --  86.4 86.6 86.4  --  87.4  PLT 172  --  145* 125* 123*  --  141*  < > = values in this interval not displayed.  INR:  Recent Labs Lab 05/30/16 1300 05/30/16 1415 05/31/16 0228 06/01/16 0500 06/02/16 0453  INR 1.38 1.36 1.19 1.21 1.22    Other results:  EKG:   Imaging: Dg Chest Port 1 View  Result Date: 06/01/2016 CLINICAL DATA:  LVAD. EXAM: PORTABLE CHEST 1 VIEW COMPARISON:  05/31/2016 FINDINGS: The Swan-Ganz catheter has been removed. A right jugular introducer sheath remains in place. An additional right jugular catheter also remains and terminates over the mid to lower SVC. Endotracheal and enteric tubes have been removed. In LVAD is partially visualized. A mediastinal drain and right chest tube remain in place. A single lead ICD also a remains. The cardiac silhouette is again mildly enlarged. Small bilateral pleural effusions and patchy and streaky  bibasilar opacities have not significantly changed. No pneumothorax is identified. IMPRESSION: 1. Interval removal of multiple support devices as above. 2. No pneumothorax. 3. Unchanged small pleural effusions and bibasilar airspace opacities which may reflect atelectasis although infection is not excluded. Electronically Signed   By: Sebastian Ache M.D.   On: 06/01/2016 08:05     Medications:     Scheduled Medications: . acetaminophen  1,000 mg Oral Q6H   Or  . acetaminophen (TYLENOL) oral liquid 160 mg/5 mL  1,000 mg Per Tube Q6H  . amiodarone  200 mg Oral BID  . aspirin EC  325 mg Oral Daily   Or  . aspirin  324 mg Per Tube Daily   Or  . aspirin  300 mg Rectal Daily  . bisacodyl  10 mg Oral Daily   Or  . bisacodyl  10 mg Rectal Daily  . docusate sodium  200 mg Oral Daily  . feeding supplement  1 Container Oral TID BM  . ferrous fumarate-b12-vitamic C-folic acid  1 capsule Oral BID BM  . guaiFENesin  600 mg Oral BID  . insulin detemir  30 Units Subcutaneous BID  . mouth rinse  15 mL Mouth Rinse BID  . metoCLOPramide (REGLAN) injection  10 mg Intravenous Q6H  . pantoprazole  40 mg Oral Daily  . sildenafil  20 mg Oral TID  . sodium chloride flush  3 mL Intravenous Q12H  . spironolactone  25 mg Oral Daily  . warfarin  5 mg Oral q1800  . Warfarin - Physician Dosing Inpatient   Does not apply q1800    Infusions: . sodium chloride Stopped (05/31/16 1800)  . sodium chloride    . sodium chloride 20 mL (05/30/16 2201)  . sodium chloride    . furosemide (LASIX) infusion 10 mg/hr (06/02/16 0700)  . insulin (NOVOLIN-R) infusion 1.8 mL/hr at 06/02/16 0700  . lactated ringers Stopped (06/01/16 0900)  . lactated ringers 20 mL/hr  at 06/02/16 0700  . milrinone 0.25 mcg/kg/min (06/02/16 0700)  . potassium chloride 10 mEq (06/02/16 0609)    PRN Medications: sodium chloride, fentaNYL (SUBLIMAZE) injection, hydrALAZINE, midazolam, ondansetron (ZOFRAN) IV, oxyCODONE, sodium chloride flush,  traMADol   Assessment/Plan/Discussion   1. 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  Post Op expected blood loss. Received 1URPBCs earlier.  CO-OX 87%. Cut back milrinone to 0.125 mcg.  Continue lasix drip. CVP 12-13 but received blood as above.  - Continue 324 mg aspirin + coumadin. INR per Dr Donata ClayVan Trigt.  2. A/C Systolic Heart Failure: 05/26/16 ECHO EF 20-25%.  - CVP ~12-14. Continue lasix drip and increase spironolactone to 25 mg daily.  - May need to add losartan if MAPs continue to run high.  3. R Pleural Effusion: R pleurX catheter 4. CAD: 3V CAD S/P stent Prox LAD and PTCA to subtotally occluded OMI 01/2016  5. Pulmonary HTN: PA catheter out.  PA pressures were lower post-LVAD.  - Pre-op, he was on Revatio 20 mg tid.  Continue revatio 20 mg tid.  6. Former Smoker.   7. HTN:  Would not use NTG drip with addition of revatio. Increase spiro to 25 mg daily. Renal function ok. Can also add losartan versus entresto if needed.  8. Anemia- Received 1UPRBC 5/31 Hgb 7.5> 8.2    I reviewed the HVAD parameters from today, and compared the results to the patient's prior recorded data.  No programming changes were made.  The HVAD is functioning within specified parameters.    Length of Stay: 9  Amy Clegg NP-C  06/02/2016, 8:00 AM  VAD Team --- VAD ISSUES ONLY--- Pager [phone] (7am - 7am)  Patient seen with NP, agree with the above note.    He is doing well post-op.  Good co-ox, MAP now elevated in the 90s-100s.  CVP remains 12-13.  - Continue Lasix gtt 10 mg/hr.  - Increase spironolactone and will add losartan 25 mg daily with high MAP.  - Decrease milrinone to 0.125, probably off tomorrow.   HR higher in 100s, sinus tachy versus atypical flutter.  Needs ECG, will order.   HVAD parameters stable.    Continue ASA and warfarin.   Ambulate, work with PT.  Progressing well so far.   Marca AnconaDalton Wilburta Milbourn 06/02/2016 10:35 AM

## 2016-06-02 NOTE — Progress Notes (Signed)
HVAD  Rounding Note  Postop day 3  Subjective:   61 year old patient with ischemic cardiomyopathy and acute on chronic systolic heart failure who presented with fatigue and failure to thrive despite home IV milrinone and maximal medical therapy including daily drainage of pleural effusion with right Pleurx catheter  The patient was assessed and found to be candidate for destination therapy LVAD implantation. He was optimized with diuresis and Plavix washout prior to surgical implantation  The patient had a stable night with good VAD parameters No alarms. Good urine output on lasix drip CBGs improved- transition off iv insulin drip  Postoperative coagulopathy  related to preoperative Plavix treated with platelet transfusion .  Expected postop blood loss anemia. Received 1 u PRBC last pm for Hb 7.5 Patient started on  Coumadin.  Chest x-ray shows good VAD orientation and no significant pleural effusion. Last  Chest tube removed   BP goal map < 85-90  Good pulsatility on VAD waveforms  LVAD INTERROGATION:  VAD:  Flow 4.8liters/min, speed 2800 rpm , power 5.1,   Controller intact  Objective:    Vital Signs:   Temp:  [98.1 F (36.7 C)-99.2 F (37.3 C)] 98.5 F (36.9 C) (06/01 0400) Pulse Rate:  [72-111] 104 (06/01 0700) Resp:  [18] 18 (06/01 0145) BP: (83-130)/(51-97) 111/91 (06/01 0611) SpO2:  [97 %-100 %] 99 % (06/01 0700) Weight:  [214 lb 11.2 oz (97.4 kg)] 214 lb 11.2 oz (97.4 kg) (06/01 0400) Last BM Date: 06/01/16 Mean arterial Pressure 85 mmHg  Intake/Output:   Intake/Output Summary (Last 24 hours) at 06/02/16 0808 Last data filed at 06/02/16 0700  Gross per 24 hour  Intake          1419.94 ml  Output             2680 ml  Net         -1260.06 ml     Physical Exam: General:  Well appearing. No resp difficulty OOB in chair HEENT: normal Neck: supple. JVP . Carotids 2+ bilat; no bruits. No lymphadenopathy or thryomegaly appreciated. Cor: Mechanical heart sounds  with LVAD hum present. Lungs: clear Abdomen: soft, nontender, nondistended. No hepatosplenomegaly. No bruits or masses. Good bowel sounds. Extremities: no cyanosis, clubbing, rash, edema Neuro: alert & orientedx3, cranial nerves grossly intact. moves all 4 extremities w/o difficulty. Affect pleasant  Telemetry:paced 90  Labs: Basic Metabolic Panel:  Recent Labs Lab 05/30/16 1300  05/30/16 1855  05/31/16 0228 05/31/16 1607 06/01/16 0500 06/01/16 1638 06/02/16 0453  NA 135  < >  --   < > 135 126* 130* 131* 135  K 3.3*  < >  --   < > 4.1 3.6 3.9 3.6 3.2*  CL 101  --   --   < > 101 95* 96* 91* 98*  CO2 27  --   --   --  27 25 26   --  29  GLUCOSE 119*  < >  --   < > 108* 120* 203* 376* 104*  BUN 8  --   --   < > 8 8 12 15 12   CREATININE 0.54*  --  0.58*  < > 0.66 0.73 0.78 0.70 0.69  CALCIUM 7.9*  --   --   --  8.0* 7.8* 8.1*  --  8.3*  MG 1.7  --  2.8*  --  2.4 2.1 2.0  --  1.8  PHOS  --   --   --   --  3.5  --  3.9  --   --   < > = values in this interval not displayed.  Liver Function Tests:  Recent Labs Lab 05/31/16 0228 06/01/16 0500 06/02/16 0453  AST 49* 41 23  ALT 14* 17 15*  ALKPHOS 51 67 64  BILITOT 1.7* 1.9* 1.4*  PROT 5.3* 5.6* 5.7*  ALBUMIN 2.9* 2.7* 2.4*   No results for input(s): LIPASE, AMYLASE in the last 168 hours. No results for input(s): AMMONIA in the last 168 hours.  CBC:  Recent Labs Lab 05/30/16 1855  05/31/16 0228 05/31/16 1607 06/01/16 0500 06/01/16 1638 06/02/16 0453  WBC 11.4*  --  10.6* 9.8 10.8*  --  8.7  NEUTROABS  --   --  8.4*  --  8.9*  --  7.1  HGB 8.7*  < > 8.6* 7.8* 7.9* 7.5* 8.2*  HCT 26.8*  < > 26.0* 23.9* 24.1* 22.0* 25.0*  MCV 86.5  --  86.4 86.6 86.4  --  87.4  PLT 172  --  145* 125* 123*  --  141*  < > = values in this interval not displayed.  INR:  Recent Labs Lab 05/30/16 1300 05/30/16 1415 05/31/16 0228 06/01/16 0500 06/02/16 0453  INR 1.38 1.36 1.19 1.21 1.22    Other results:  EKG:    Imaging: Dg Chest Port 1 View  Result Date: 06/01/2016 CLINICAL DATA:  LVAD. EXAM: PORTABLE CHEST 1 VIEW COMPARISON:  05/31/2016 FINDINGS: The Swan-Ganz catheter has been removed. A right jugular introducer sheath remains in place. An additional right jugular catheter also remains and terminates over the mid to lower SVC. Endotracheal and enteric tubes have been removed. In LVAD is partially visualized. A mediastinal drain and right chest tube remain in place. A single lead ICD also a remains. The cardiac silhouette is again mildly enlarged. Small bilateral pleural effusions and patchy and streaky bibasilar opacities have not significantly changed. No pneumothorax is identified. IMPRESSION: 1. Interval removal of multiple support devices as above. 2. No pneumothorax. 3. Unchanged small pleural effusions and bibasilar airspace opacities which may reflect atelectasis although infection is not excluded. Electronically Signed   By: Sebastian Ache M.D.   On: 06/01/2016 08:05     Medications:     Scheduled Medications: . acetaminophen  1,000 mg Oral Q6H   Or  . acetaminophen (TYLENOL) oral liquid 160 mg/5 mL  1,000 mg Per Tube Q6H  . amiodarone  200 mg Oral BID  . aspirin EC  325 mg Oral Daily   Or  . aspirin  324 mg Per Tube Daily   Or  . aspirin  300 mg Rectal Daily  . bisacodyl  10 mg Oral Daily   Or  . bisacodyl  10 mg Rectal Daily  . docusate sodium  200 mg Oral Daily  . feeding supplement  1 Container Oral TID BM  . ferrous fumarate-b12-vitamic C-folic acid  1 capsule Oral BID BM  . guaiFENesin  600 mg Oral BID  . insulin aspart  0-24 Units Subcutaneous Q4H  . insulin aspart  4 Units Subcutaneous TID WC  . insulin detemir  30 Units Subcutaneous BID  . mouth rinse  15 mL Mouth Rinse BID  . metoCLOPramide (REGLAN) injection  10 mg Intravenous Q6H  . pantoprazole  40 mg Oral Daily  . sildenafil  20 mg Oral TID  . sodium chloride flush  3 mL Intravenous Q12H  . spironolactone  25 mg  Oral Daily  . warfarin  5 mg Oral q1800  .  Warfarin - Physician Dosing Inpatient   Does not apply q1800    Infusions: . sodium chloride Stopped (05/31/16 1800)  . sodium chloride    . sodium chloride 20 mL (05/30/16 2201)  . sodium chloride    . furosemide (LASIX) infusion 10 mg/hr (06/02/16 0700)  . insulin (NOVOLIN-R) infusion 1.8 mL/hr at 06/02/16 0700  . lactated ringers Stopped (06/01/16 0900)  . lactated ringers 20 mL/hr at 06/02/16 0700  . milrinone 0.25 mcg/kg/min (06/02/16 0700)  . potassium chloride 10 mEq (06/02/16 0609)    PRN Medications: sodium chloride, fentaNYL (SUBLIMAZE) injection, hydrALAZINE, midazolam, ondansetron (ZOFRAN) IV, oxyCODONE, sodium chloride flush, traMADol   Assessment:  Acute on chronic systolic heart failure from ischemic cardiomyopathy Previous placement of AICD pacemaker system Preoperative pulmonary  hypertension treated with sildenafil  Recent smoker history   Plan/Discussion:    Patient showing excellent early recovery  Needs diuresis and BP control- cont lasix drip another day Coumadin  5 mg/day- no heparin bridge today with transfusion needed last pm Continue milrinone at 0.125 for  RV function today Remove last chest tubes\ I reviewed the LVAD parameters from today, and compared the results to the patient's prior recorded data.  No programming changes were made.  The LVAD is functioning within specified parameters.  The patient performs LVAD self-test daily.  LVAD interrogation was negative for any significant power changes, alarms or PI events/speed drops.  LVAD equipment check completed and is in good working order.  Back-up equipment present.   LVAD education done on emergency procedures and precautions and reviewed exit site care.  Length of Stay: [address]9  Kathlee Nationseter Van Egyptrigt III 06/02/2016, 8:08 AM

## 2016-06-02 NOTE — Progress Notes (Signed)
Admitted 5/23/18from clinic for heart failure decompensation.  Heartware LVAD implanted on 5/29/2018by Tharon Aquas Trigt as DT VAD.  Vital signs: HR: 94 Doppler Pressure: 92 Automatic BP: 138/101 (113) O2 Sat: 100 RA Wt in lbs: 200 > 216>219>214   LVAD interrogation reveals:  Speed: 2800 Flow: 5.5 Power: 5w Peak /Trough: 8/4 Alarms: none   Suction alarm turned ON  Alarm settings: Low flow: 3.0 High Power: 7.0   Drive Line:  Daily using daily dressing kit with aquacel silver strips on exit site  Gtts:  Mil .125 mcg/kg/min Lasix 67m  Blood products: 1 PC and 4 Plasma - in OR 05/30/16 1 unit PRBCs today-06-02-16  ICD: St Jude single lead Therapy off VVI 40  Labs:  LDH trend: [id number] INR trend: 1.36 > 1.19>1.21>1.22  Anticoagulation Plan: -INR Goal: 2-2.5 -ASA Dose: 325 (started 05/31/16)  Adverse Events on VAD: None overnight  Pt had just completed 2 laps around the unit. Pt states that he is feeling good and trying to learn his equipment.  Plan/Recommendations:   1. Daily dressing changes. 2. Please ensure to doppler once a shift in addition to a cuff pressure. 3. Please be mindful to keep MAP less than 85.   STanda RockersRN, VAD Coordinator 24/7 pager [phone]

## 2016-06-02 NOTE — Progress Notes (Signed)
Physical Therapy Treatment Patient Details Name: [name] MRN: [id number] DOB: January 31, 1955 Today's Date: 06/02/2016    History of Present Illness Pt adm with  Acute on chronic systolic CHF due to ICM and plans for LVAD after insurance approval. 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 PMH - Rt pleural effusion with pleurx, CHF, DM, HTN, CAD, PVD. BLE bypass grafts, AICD    PT Comments    Pt making excellent progress. Pt verbalizing correct technique for [facility] to battery power. A   Follow Up Recommendations  No PT follow up     Equipment Recommendations  Other (comment) (?rollator)    Recommendations for Other Services       Precautions / Restrictions Precautions Precautions: Sternal;Fall    Mobility  Bed Mobility               General bed mobility comments: Pt up in chair  Transfers Overall transfer level: Needs assistance Equipment used: Pushed w/c Transfers: Sit to/from Stand Sit to Stand: +2 physical assistance;Min assist         General transfer comment: Verbal cues for technique. Assist to bring hips up and for balance  Ambulation/Gait Ambulation/Gait assistance: Min guard;+2 safety/equipment Ambulation Distance (Feet): 700 Feet Assistive device: Pushed wheelchair Gait Pattern/deviations: Step-through pattern;Decreased stride length Gait velocity: decr Gait velocity interpretation: Below normal speed for age/gender General Gait Details: Assist for safety and support. Pt amb on 2L of O2 with SpO2 >96%. HR to 123 with amb   Stairs            Wheelchair Mobility    Modified Rankin (Stroke Patients Only)       Balance Overall balance assessment: Needs assistance Sitting-balance support: No upper extremity supported;Feet supported Sitting balance-Leahy Scale: Good     Standing balance support: Single extremity supported Standing balance-Leahy Scale: Poor Standing balance comment: UE and supervision for static standing                             Cognition Arousal/Alertness: Awake/alert Behavior During Therapy: WFL for tasks assessed/performed Overall Cognitive Status: Within Functional Limits for tasks assessed                                        Exercises      General Comments        Pertinent Vitals/Pain Pain Assessment: No/denies pain    Home Living                      Prior Function            PT Goals (current goals can now be found in the care plan section) Progress towards PT goals: Progressing toward goals    Frequency    Min 3X/week      PT Plan Current plan remains appropriate    Co-evaluation              AM-PAC PT "6 Clicks" Daily Activity  Outcome Measure  Difficulty turning over in bed (including adjusting bedclothes, sheets and blankets)?: Total Difficulty moving from lying on back to sitting on the side of the bed? : Total Difficulty sitting down on and standing up from a chair with arms (e.g., wheelchair, bedside commode, etc,.)?: Total Help needed moving to and from a bed to chair (including a wheelchair)?: A Little  Help needed walking in hospital room?: A Little Help needed climbing 3-5 steps with a railing? : A Lot 6 Click Score: 11    End of Session Equipment Utilized During Treatment: Oxygen Activity Tolerance: Patient tolerated treatment well Patient left: in chair;with call bell/phone within reach Nurse Communication: Mobility status PT Visit Diagnosis: Unsteadiness on feet (R26.81);Difficulty in walking, not elsewhere classified (R26.2)     Time: [id number] PT Time Calculation (min) (ACUTE ONLY): 37 min  Charges:  $Gait Training: 23-37 mins                    G Codes:       [facility] PT [phone]    Angelina Ok [facility] 06/02/2016, 1:23 PM

## 2016-06-02 NOTE — Progress Notes (Signed)
CSW met with patient at bedside. Patient sitting up in bed and states he is feeling improvement and noted that he walked two laps around the unit. Patient was in good spirits and denied any concerns at the moment. CSW asked about adjustment to the lines and batteries of the LVAD and patient stated he is so grateful for the opportunity to extend his life with the device that all that goes with it are "not an issue". CSW will continue to follow for supportive needs throughout hospitalization. Raquel Sarna, [place], [place]

## 2016-06-02 NOTE — Op Note (Signed)
NAME:  [name], OPARA NO.:  MEDICAL RECORD NO.:  [id number]  LOCATION:                                 FACILITY:  PHYSICIAN:  Kerin Perna, M.D.  DATE OF BIRTH:  1955/07/11  DATE OF PROCEDURE:  05/30/2016 DATE OF DISCHARGE:                              OPERATIVE REPORT   OPERATION:  Placement of HVAD implantable left ventricular assist device for acute on chronic systolic heart failure.  SURGEON:  Kerin Perna, MD.  ASSISTANT:  Evelene Croon, M.D.  ANESTHESIA:  General by Dr. Marcene Duos.  PREOPERATIVE DIAGNOSIS:  Ischemic cardiomyopathy, class IV congestive heart failure, home Milrinone therapy.  POSTOPERATIVE DIAGNOSIS:  Ischemic cardiomyopathy, class IV congestive heart failure, home Milrinone therapy.  CLINICAL NOTE:  The patient is a 61 year old Caucasian male smoker and diabetic, who has been followed by the advanced heart failure team for several months with end-stage heart failure.  Ejection fraction is 15- 20%.  He has been on IV Milrinone through a PICC line for several months.  He has gradually decreased his functional capacity.  He was admitted recently for recurrent right pleural effusion, for which a PleurX catheter had been placed.  He also had problems with increasing weight gain and resistance to diuretic therapy with poor appetite and fatigue.  On admission to the hospital, he was found to have low mixed venous saturation and his inotropes were titrated and a pulmonary artery catheter was placed.  The patient had been recommended for a ventricular assist device and undergone extensive evaluation by the mechanical cardiac support service including medical evaluation, social worker evaluation, and psychological evaluation.  He was found to have severe end-stage heart disease with adequate renal, hepatic, pulmonary, and neurologic function.  He was found to have adequate social support for an implantable LVAD recipient.   He was recommended for destination therapy left ventricular assist device implantation.  I had seen the patient as well in the office on several occasions and had discussed the procedure of LVAD implantation including the benefits, risks, and expected hospital recovery.  He understood the risks of stroke, bleeding, blood transfusion requirement, infection, multiorgan system failure, and death.  We admitted the patient for Plavix washout with a bridge with IV Aggrastat since the patient had had a previous stent placed 3 months ago.  The patient was afebrile, sinus rhythm, adequate renal function, and a CVP of less than 10.  He was optimally medically managed at the time of surgery.  OPERATIVE PROCEDURE:  The patient was brought from the CCU to the preop holding area where the Anesthesia team placed a pulmonary artery catheter, A-line, and removed his PICC line.  I examined the patient and again reviewed the procedure and informed consent was confirmed.  The patient was brought to the operating room, placed supine on the operating table, where general anesthesia was induced.  A transesophageal echo probe was placed by the Anesthesia team.  This showed severe LV dysfunction with a dilated 6.5 cm left ventricle.  RV function appeared to be intact.  There was no significant TR.  There was no PFO.  There was  no significant AI. The patient was prepped and draped as a sterile field.  A proper time- out was performed.  The HVAD pump was preassembled on the back table by the surgical team.  A sternal incision was made.  The sternum was retracted and the pericardium was opened.  The heart was extremely dilated and hypocontractile.  Pursestrings were placed in ascending aorta and right atrium.  Heparin was administered.  A small incision was made to the left of the umbilicus for the power cable to be tunneled from the chest down to the left upper quadrant.  The patient was cannulated and  placed on cardiopulmonary bypass.  A small pocket had been created by extending the pericardiectomy to the left, but the pleural space was not opened.  The field was insufflated with CO2.  The heart was supported on lap sponges and remained empty beating.  The apex was identified.  The inflow cannula connector was then sewn to the epicardium with 12 sutures of 2-0 Ethibond pledgeted mattress sutures.  A cruciate incision was made in the inflow connector and the HVAD inflow cannula was inserted after removing some trabecula and confirmed me that there was no apical thrombus.  The LVAD was inserted well.  Volume was retained in the heart to avoid any entrapment of air.  The screw was tightened to secure the inflow cannula in position.  Next, the power cord was brought out through the subcutaneous tunnel from the chest to the left abdomen and then tunneled out in the left upper quadrant and connected to the power cord extender, which was connected to the controller.  The LVAD was placed in the pericardium.  The outflow graft was then filled and cut to the appropriate length and orientation.  A partial occluding clamp was placed on the ascending aorta and an end- to-side anastomosis with the outflow graft was constructed using a single running 4-0 Prolene.  The suture line was reinforced with medical adhesive-Coseal.  A vascular clamp was placed on the graft and on the aortic side of the clamp, a 20-gauge needle was placed and the graft de- aired.  The partial clamp on the aorta was removed.  The aortic anastomosis was hemostatic.  We then reinflated the lungs and started inotropic drips.  The heart was filled and the ventilator was resumed with 30 parts per million of nitric oxide.  Flow on the cardiopulmonary bypass circuit was then gradually reduced.  At half flow, the speed on the HVAD pump was increased to 1900 and a vascular clamp was removed.  Gradually, the speed on the HVAD was  increased as cardiopulmonary bypass circuit reduced to quarter flow.  At that point, we were on 2200 RPMs. Cardiopulmonary bypass was stopped and the patient was on full support with the HVAD at 2300 RPMs.  Echo showed good position of the inflow cannula and a midline septum.  The PA pressures were low and the CVP was also low.  Mean arterial pressure was in the 80s.  Protamine was administered without adverse reaction.  The cannulas for cardiopulmonary bypass were removed.  The patient had been on Plavix and had general oozing and coagulopathy, so he was given platelets 1 unit and 1 unit of FFP with improved coagulation function.  A right pleural chest tube and anterior mediastinal tube and a LVAD pocket drain tube were then placed and brought out through separate incisions.  I then covered the outflow graft with a 3 mm sheet of Gore-Tex.  So that in a redo sternotomy scenario, the graft would not be at risk for bleeding.  Next, the chest was closed in routine fashion with interrupted steel wire.  Pectoralis fascia was closed with running #1 Vicryl.  The subcutaneous and skin layers were closed with running Vicryl.  The counter incision for the power cord tunnel was closed in layers using Vicryl.  The outflow exit site of the power cord was closed with a subcutaneous Vicryl and then 2 skin nylon sutures.  Sterile dressings were placed over the main sternal incision as well as the bad outflow exit site and the patient was transported back to ICU.  Total cardiopulmonary bypass time was 70 minutes.     Kerin PernaPeter Van Trigt, M.D.     PV/MEDQ  D:  06/01/2016  T:  06/02/2016  Job:  [id number]

## 2016-06-03 ENCOUNTER — Inpatient Hospital Stay (HOSPITAL_COMMUNITY): Payer: BLUE CROSS/BLUE SHIELD

## 2016-06-03 DIAGNOSIS — I10 Essential (primary) hypertension: Secondary | ICD-10-CM

## 2016-06-03 DIAGNOSIS — D62 Acute posthemorrhagic anemia: Secondary | ICD-10-CM

## 2016-06-03 LAB — TYPE AND SCREEN
ABO/RH(D): A POS
Antibody Screen: NEGATIVE
Unit division: 0

## 2016-06-03 LAB — CBC WITH DIFFERENTIAL/PLATELET
Basophils Absolute: 0 10*3/uL (ref 0.0–0.1)
Basophils Relative: 0 %
Eosinophils Absolute: 0.2 10*3/uL (ref 0.0–0.7)
Eosinophils Relative: 3 %
HCT: 24.4 % — ABNORMAL LOW (ref 39.0–52.0)
Hemoglobin: 7.9 g/dL — ABNORMAL LOW (ref 13.0–17.0)
Lymphocytes Relative: 8 %
Lymphs Abs: 0.7 10*3/uL (ref 0.7–4.0)
MCH: 28.2 pg (ref 26.0–34.0)
MCHC: 32.4 g/dL (ref 30.0–36.0)
MCV: 87.1 fL (ref 78.0–100.0)
Monocytes Absolute: 0.9 10*3/uL (ref 0.1–1.0)
Monocytes Relative: 11 %
Neutro Abs: 6.3 10*3/uL (ref 1.7–7.7)
Neutrophils Relative %: 78 %
Platelets: 158 10*3/uL (ref 150–400)
RBC: 2.8 MIL/uL — ABNORMAL LOW (ref 4.22–5.81)
RDW: 16.8 % — ABNORMAL HIGH (ref 11.5–15.5)
WBC: 8.1 10*3/uL (ref 4.0–10.5)

## 2016-06-03 LAB — COOXEMETRY PANEL
Carboxyhemoglobin: 1.8 % — ABNORMAL HIGH (ref 0.5–1.5)
Methemoglobin: 0.8 % (ref 0.0–1.5)
O2 Saturation: 86.3 %
Total hemoglobin: 8.1 g/dL — ABNORMAL LOW (ref 12.0–16.0)

## 2016-06-03 LAB — LACTATE DEHYDROGENASE: LDH: 237 U/L — ABNORMAL HIGH (ref 98–192)

## 2016-06-03 LAB — POCT I-STAT, CHEM 8
BUN: 9 mg/dL (ref 6–20)
CALCIUM ION: 1.09 mmol/L — AB (ref 1.15–1.40)
CHLORIDE: 91 mmol/L — AB (ref 101–111)
Creatinine, Ser: 0.6 mg/dL — ABNORMAL LOW (ref 0.61–1.24)
GLUCOSE: 132 mg/dL — AB (ref 65–99)
HCT: 25 % — ABNORMAL LOW (ref 39.0–52.0)
Hemoglobin: 8.5 g/dL — ABNORMAL LOW (ref 13.0–17.0)
POTASSIUM: 3.4 mmol/L — AB (ref 3.5–5.1)
SODIUM: 134 mmol/L — AB (ref 135–145)
TCO2: 30 mmol/L (ref 0–100)

## 2016-06-03 LAB — BASIC METABOLIC PANEL
Anion gap: 8 (ref 5–15)
BUN: 11 mg/dL (ref 6–20)
CO2: 29 mmol/L (ref 22–32)
Calcium: 8 mg/dL — ABNORMAL LOW (ref 8.9–10.3)
Chloride: 96 mmol/L — ABNORMAL LOW (ref 101–111)
Creatinine, Ser: 0.71 mg/dL (ref 0.61–1.24)
GFR calc Af Amer: >60 mL/min (ref >60)
GFR calc non Af Amer: >60 mL/min (ref >60)
Glucose, Bld: 118 mg/dL — ABNORMAL HIGH (ref 65–99)
Potassium: 2.7 mmol/L — CL (ref 3.5–5.1)
Sodium: 133 mmol/L — ABNORMAL LOW (ref 135–145)

## 2016-06-03 LAB — GLUCOSE, CAPILLARY
GLUCOSE-CAPILLARY: 113 mg/dL — AB (ref 65–99)
GLUCOSE-CAPILLARY: 142 mg/dL — AB (ref 65–99)
Glucose-Capillary: 129 mg/dL — ABNORMAL HIGH (ref 65–99)
Glucose-Capillary: 199 mg/dL — ABNORMAL HIGH (ref 65–99)
Glucose-Capillary: 113 mg/dL — ABNORMAL HIGH (ref 65–99)

## 2016-06-03 LAB — BPAM RBC
Blood Product Expiration Date: 201806102359
ISSUE DATE / TIME: 201806010119
Unit Type and Rh: 6200

## 2016-06-03 LAB — PROTIME-INR
INR: 1.19
Prothrombin Time: 15.2 seconds (ref 11.4–15.2)

## 2016-06-03 LAB — MAGNESIUM: Magnesium: 1.7 mg/dL (ref 1.7–2.4)

## 2016-06-03 MED ORDER — POTASSIUM CHLORIDE 10 MEQ/50ML IV SOLN
10.0000 meq | INTRAVENOUS | Status: AC | PRN
  Administered 2016-06-03 (×3): 10 meq via INTRAVENOUS

## 2016-06-03 MED ORDER — POTASSIUM CHLORIDE CRYS ER 20 MEQ PO TBCR
40.0000 meq | EXTENDED_RELEASE_TABLET | Freq: Two times a day (BID) | ORAL | Status: DC
  Administered 2016-06-03 – 2016-06-11 (×18): 40 meq via ORAL
  Filled 2016-06-03 (×18): qty 2

## 2016-06-03 MED ORDER — INSULIN DETEMIR 100 UNIT/ML ~~LOC~~ SOLN
25.0000 [IU] | Freq: Two times a day (BID) | SUBCUTANEOUS | Status: DC
  Administered 2016-06-03 (×2): 25 [IU] via SUBCUTANEOUS
  Filled 2016-06-03 (×3): qty 0.25

## 2016-06-03 MED ORDER — LOSARTAN POTASSIUM 50 MG PO TABS: 50.0000 mg | ORAL_TABLET | Freq: Every day | ORAL | Status: DC

## 2016-06-03 MED ORDER — MAGNESIUM SULFATE 50 % IJ SOLN
3.0000 g | Freq: Once | INTRAMUSCULAR | Status: AC
  Administered 2016-06-03: 3 g via INTRAVENOUS
  Filled 2016-06-03: qty 6

## 2016-06-03 MED ORDER — WARFARIN - PHARMACIST DOSING INPATIENT
Freq: Every day | Status: DC
  Administered 2016-06-03 – 2016-06-04 (×2): 1
  Administered 2016-06-05 – 2016-06-12 (×4)

## 2016-06-03 MED ORDER — SACUBITRIL-VALSARTAN 24-26 MG PO TABS
1.0000 | ORAL_TABLET | Freq: Two times a day (BID) | ORAL | Status: DC
  Administered 2016-06-03 (×2): 1 via ORAL
  Filled 2016-06-03 (×3): qty 1

## 2016-06-03 MED ORDER — POTASSIUM CHLORIDE 10 MEQ/50ML IV SOLN
INTRAVENOUS | Status: AC
  Filled 2016-06-03: qty 150

## 2016-06-03 MED ORDER — POTASSIUM CHLORIDE 10 MEQ/50ML IV SOLN
10.0000 meq | INTRAVENOUS | Status: AC
  Administered 2016-06-03 (×3): 10 meq via INTRAVENOUS
  Filled 2016-06-03 (×3): qty 50

## 2016-06-03 MED ORDER — WARFARIN SODIUM 7.5 MG PO TABS
7.5000 mg | ORAL_TABLET | Freq: Every day | ORAL | Status: DC
  Administered 2016-06-03 – 2016-06-07 (×5): 7.5 mg via ORAL
  Filled 2016-06-03 (×6): qty 1

## 2016-06-03 NOTE — Plan of Care (Signed)
Problem: Activity: Goal: Risk for activity intolerance will decrease Outcome: Progressing Pt ambulating unit  On RA X2  Problem: Fluid Volume: Goal: Risk for excess fluid volume will decrease Outcome: Progressing Pt on lasix gtt  Problem: Physical Regulation: Goal: Will remain free from infection Outcome: Progressing No s&s of infection  Problem: Respiratory: Goal: Ability to maintain adequate ventilation will improve Outcome: Completed/Met Date Met: 06/03/16 Pt on RA

## 2016-06-03 NOTE — Progress Notes (Signed)
ANTICOAGULATION CONSULT NOTE - Follow Up Consult  Pharmacy Consult for Warfarin Indication: LVAD  Allergies  Allergen Reactions  . Metformin And Related Nausea And Vomiting    *Only the extended release* (does this)  . Niacin And Related Other (See Comments)    REACTION IS SIDE EFFECT "SEVERE" FLUSHING    Patient Measurements: Height: 6' (182.9 cm) Weight: 220 lb 14.4 oz (100.2 kg) IBW/kg (Calculated) : 77.6   Vital Signs: Temp: 98.9 F (37.2 C) (06/02 0335) Temp Source: Oral (06/02 0335) BP: 123/102 (06/02 0700) Pulse Rate: 103 (06/02 0700)  Labs:  Recent Labs  06/01/16 0500  06/02/16 0453 06/02/16 1628 06/03/16 0411  HGB 7.9*  < > 8.2* 8.8* 7.9*  HCT 24.1*  < > 25.0* 26.0* 24.4*  PLT 123*  --  141*  --  158  LABPROT 15.4*  --  15.4*  --  15.2  INR 1.21  --  1.22  --  1.19  CREATININE 0.78  < > 0.69 0.70 0.71  < > = values in this interval not displayed.  Estimated Creatinine Clearance: 118.8 mL/min (by C-G formula based on SCr of 0.71 mg/dL).     Assessment: 60yom with CAD- PCI LAD 1/18 on plavix PTA held for LVAD implant - [facility] 05/30/2016.   Warfarin started 5/30 post op day #1 INR remains low 1.2 warfarin 2.5 x1 then 5mg  x2.  H/h fell though no obvious bleeding - s/p PRBC 6/1.     Goal of Therapy:  INR 2-3 Monitor platelets by anticoagulation protocol: Yes   Plan:  Warfarin 7.5mg  x1  Daily Protime, CBC  Leota SauersLisa Alexavier Tsutsui Pharm.D. CPP, BCPS Clinical Pharmacist [phone] 06/03/2016 8:14 AM

## 2016-06-03 NOTE — Progress Notes (Signed)
HVAD  Rounding Note  Subjective:    Post Op Day 4 HVAD   Remains on lasix gtt. Also on revatio and spiro. CVP 11-12.   Milrinone decreased to 0.125 yesterday. Co-ox 86%.  Losartan 25 added yesterday.  Feels great. Walking unit briskly. Hungry for breakfast. Good urine output. Weight up today (We reweighed and confirmed)  Hgb 7.5>8.2>7.9 LDH 237  LVAD INTERROGATION:  HVAD:  Flow  5.2 liters/min, RPM 2800  Watts 4.7.  Peak 7.5, trough 3.8   Objective:    Vital Signs:   Temp:  [98 F (36.7 C)-99.6 F (37.6 C)] 98.9 F (37.2 C) (06/02 0335) Pulse Rate:  [94-113] 103 (06/02 0700) BP: (87-138)/(72-102) 123/102 (06/02 0700) SpO2:  [87 %-100 %] 100 % (06/02 0700) Weight:  [100.2 kg (220 lb 14.4 oz)] 100.2 kg (220 lb 14.4 oz) (06/02 0600) Last BM Date: 06/01/16 Mean arterial Pressure 80-110s  Intake/Output:   Intake/Output Summary (Last 24 hours) at 06/03/16 0757 Last data filed at 06/03/16 0700  Gross per 24 hour  Intake          2296.43 ml  Output             2880 ml  Net          -583.57 ml     Physical Exam: GENERAL: Sitting in the chair. NAD HEENT: normal   NECK: Supple, RIJ TLC  2+ bilaterally, no bruits.  No lymphadenopathy or thyromegaly appreciated.   CARDIAC:  Mechanical heart sounds with LVAD hum present. Normal sound. Sternotomy looks good.  LUNGS:  Clear to auscultation bilaterally. No wheeze ABDOMEN:  Soft NT/ND, good bowel sounds  LVAD exit site:  Dressing dry and intact.  No signs infection  EXTREMITIES:  Warm and dry, no cyanosis, clubbing, rash. Trace edemahyperpigmentation noted.  NEUROLOGIC:  Alert and oriented x 4.  Gait steady.  No aphasia.  No dysarthria.  Affect pleasant.     Telemetry: Sinus 100-105 with PVCs. Personally reviewed .    Labs: Basic Metabolic Panel:  Recent Labs Lab 05/30/16 1300  05/31/16 0228 05/31/16 1607 06/01/16 0500 06/01/16 1638 06/02/16 0453 06/02/16 1628 06/03/16 0411  NA 135  < > 135 126* 130* 131* 135  134*  --   K 3.3*  < > 4.1 3.6 3.9 3.6 3.2* 3.6  --   CL 101  < > 101 95* 96* 91* 98* 92*  --   CO2 27  --  27 25 26   --  29  --   --   GLUCOSE 119*  < > 108* 120* 203* 376* 104* 205*  --   BUN 8  < > 8 8 12 15 12 13   --   CREATININE 0.54*  < > 0.66 0.73 0.78 0.70 0.69 0.70  --   CALCIUM 7.9*  --  8.0* 7.8* 8.1*  --  8.3*  --   --   MG 1.7  < > 2.4 2.1 2.0  --  1.8  --  1.7  PHOS  --   --  3.5  --  3.9  --   --   --   --   < > = values in this interval not displayed.  Liver Function Tests:  Recent Labs Lab 05/31/16 0228 06/01/16 0500 06/02/16 0453  AST 49* 41 23  ALT 14* 17 15*  ALKPHOS 51 67 64  BILITOT 1.7* 1.9* 1.4*  PROT 5.3* 5.6* 5.7*  ALBUMIN 2.9* 2.7* 2.4*   No results for  input(s): LIPASE, AMYLASE in the last 168 hours. No results for input(s): AMMONIA in the last 168 hours.  CBC:  Recent Labs Lab 05/31/16 0228 05/31/16 1607 06/01/16 0500 06/01/16 1638 06/02/16 0453 06/02/16 1628 06/03/16 0411  WBC 10.6* 9.8 10.8*  --  8.7  --  8.1  NEUTROABS 8.4*  --  8.9*  --  7.1  --  6.3  HGB 8.6* 7.8* 7.9* 7.5* 8.2* 8.8* 7.9*  HCT 26.0* 23.9* 24.1* 22.0* 25.0* 26.0* 24.4*  MCV 86.4 86.6 86.4  --  87.4  --  87.1  PLT 145* 125* 123*  --  141*  --  158    INR:  Recent Labs Lab 05/30/16 1415 05/31/16 0228 06/01/16 0500 06/02/16 0453 06/03/16 0411  INR 1.36 1.19 1.21 1.22 1.19    Other results:   Imaging: Dg Chest Port 1 View  Result Date: 06/03/2016 CLINICAL DATA:  Followup left ventricular assist device. EXAM: PORTABLE CHEST 1 VIEW COMPARISON:  06/02/2016 FINDINGS: Pacemaker/ AICD is unchanged. LVAD device appears the same. Right internal jugular central line tip is in the SVC 4 cm above the right atrium. Interstitial and alveolar edema persists cyst with small effusions. No change since yesterday. IMPRESSION: No change. Persistent interstitial and alveolar edema with small effusions. No unexpected finding relative to the LVAD. Electronically Signed   By:  Paulina FusiMark  Shogry M.D.   On: 06/03/2016 06:57   Dg Chest Port 1 View  Result Date: 06/02/2016 CLINICAL DATA:  Shortness of breath. EXAM: PORTABLE CHEST 1 VIEW COMPARISON:  06/01/2016. FINDINGS: Interim removal of right IJ sheath, right IJ line stable position. Interim removal of right chest tube and mediastinal drainage catheter. Cardiac pace and r left ventricular assist device in stable position. R median sternotomy. Stable cardiomegaly. Low lung volumes with persistent infiltrate/edema in the right lung base. Small right pleural effusion again noted. No pneumothorax. IMPRESSION: 1. Interim removal of right IJ sheath, right IJ line stable position. Interim removal of right chest tube and mediastinal drainage catheter. No pneumothorax. 2. Cardiac pacer left ventricular assist device in stable position. Prior median sternotomy. Stable cardiomegaly . 3. Persistent right base infiltrate/edema and small right pleural effusion. No change. Electronically Signed   By: Maisie Fushomas  Register   On: 06/02/2016 08:09     Medications:     Scheduled Medications: . acetaminophen  1,000 mg Oral Q6H   Or  . acetaminophen (TYLENOL) oral liquid 160 mg/5 mL  1,000 mg Per Tube Q6H  . amiodarone  200 mg Oral BID  . aspirin EC  325 mg Oral Daily   Or  . aspirin  324 mg Per Tube Daily   Or  . aspirin  300 mg Rectal Daily  . bisacodyl  10 mg Oral Daily   Or  . bisacodyl  10 mg Rectal Daily  . docusate sodium  200 mg Oral Daily  . ferrous fumarate-b12-vitamic C-folic acid  1 capsule Oral BID BM  . guaiFENesin  600 mg Oral BID  . insulin aspart  0-24 Units Subcutaneous Q4H  . insulin aspart  4 Units Subcutaneous TID WC  . insulin detemir  30 Units Subcutaneous BID  . losartan  25 mg Oral Daily  . mouth rinse  15 mL Mouth Rinse BID  . metoCLOPramide (REGLAN) injection  10 mg Intravenous Q6H  . pantoprazole  40 mg Oral Daily  . sildenafil  20 mg Oral TID  . sodium chloride flush  3 mL Intravenous Q12H  . spironolactone   25  mg Oral Daily  . warfarin  5 mg Oral q1800  . Warfarin - Physician Dosing Inpatient   Does not apply q1800    Infusions: . sodium chloride Stopped (05/31/16 1800)  . sodium chloride    . sodium chloride 20 mL (05/30/16 2201)  . sodium chloride    . furosemide (LASIX) infusion 10 mg/hr (06/03/16 0700)  . insulin (NOVOLIN-R) infusion Stopped (06/02/16 1900)  . lactated ringers Stopped (06/01/16 0900)  . lactated ringers 20 mL/hr at 06/03/16 0700  . milrinone 0.125 mcg/kg/min (06/03/16 0700)  . potassium chloride 10 mEq (06/03/16 0631)  . potassium chloride      PRN Medications: sodium chloride, fentaNYL (SUBLIMAZE) injection, hydrALAZINE, midazolam, ondansetron (ZOFRAN) IV, oxyCODONE, potassium chloride, sodium chloride flush, traMADol   Assessment/Plan/Discussion    1. S/P HVAD 05/30/16--> HVAD parameters stable.  - Doing well. Ambulating unit - VAD parameters stable - Continue 324 mg aspirin + coumadin. Will adjust coumadin. D/w PharmD 2. A/C Systolic Heart Failure: 05/26/16 ECHO EF 20-25%. - CVP and weight remains up. Continue lasix gtt - Co-ox remains up. Will keep milrinone on today and stop tomorrow.  - CVP ~12 Continue lasix drip and spironolactone to 25 mg daily.  - MAPs are high. Switch losartan to Entresto 24/26. Continue prn hydralazine 3. R Pleural Effusion: - continue R pleurX catheter 4. CAD:  -3V CAD S/P stent Prox LAD and PTCA to subtotally occluded OMI 01/2016  -Eventually consider adding Plavix back 5. Pulmonary HTN: PA catheter out.  PA pressures were lower post-LVAD.  - Pre-op, he was on Revatio 20 mg tid.  Continue revatio 20 mg tid.  6. Former Smoker.   7. HTN:   - MAPs are high. Switch losartan to Entresto 24/26 - Continue prn hydralazine 8. Anemia -Hgb drifting back down. Got 1u PRBCs on 6/1. May need another. No obvious bleeding. Continue protonix. Keep active T&S.   I reviewed the HVAD parameters from today, and compared the results to the  patient's prior recorded data.  No programming changes were made.  The HVAD is functioning within specified parameters.    Length of Stay: 10  Arvilla Meres MD 06/03/2016, 7:57 AM  VAD Team --- VAD ISSUES ONLY--- Pager [phone] (7am - 7am)

## 2016-06-04 DIAGNOSIS — J90 Pleural effusion, not elsewhere classified: Secondary | ICD-10-CM

## 2016-06-04 LAB — GLUCOSE, CAPILLARY
GLUCOSE-CAPILLARY: 81 mg/dL (ref 65–99)
GLUCOSE-CAPILLARY: 162 mg/dL — AB (ref 65–99)
GLUCOSE-CAPILLARY: 174 mg/dL — AB (ref 65–99)
GLUCOSE-CAPILLARY: 199 mg/dL — AB (ref 65–99)
Glucose-Capillary: 77 mg/dL (ref 65–99)
Glucose-Capillary: 177 mg/dL — ABNORMAL HIGH (ref 65–99)

## 2016-06-04 LAB — CBC WITH DIFFERENTIAL/PLATELET
Basophils Absolute: 0 10*3/uL (ref 0.0–0.1)
Basophils Relative: 0 %
Eosinophils Absolute: 0.3 10*3/uL (ref 0.0–0.7)
Eosinophils Relative: 4 %
HCT: 24.1 % — ABNORMAL LOW (ref 39.0–52.0)
Hemoglobin: 7.8 g/dL — ABNORMAL LOW (ref 13.0–17.0)
Lymphocytes Relative: 10 %
Lymphs Abs: 0.8 10*3/uL (ref 0.7–4.0)
MCH: 28.2 pg (ref 26.0–34.0)
MCHC: 32.4 g/dL (ref 30.0–36.0)
MCV: 87 fL (ref 78.0–100.0)
Monocytes Absolute: 0.8 10*3/uL (ref 0.1–1.0)
Monocytes Relative: 11 %
Neutro Abs: 5.6 10*3/uL (ref 1.7–7.7)
Neutrophils Relative %: 75 %
Platelets: 184 10*3/uL (ref 150–400)
RBC: 2.77 MIL/uL — ABNORMAL LOW (ref 4.22–5.81)
RDW: 16.8 % — ABNORMAL HIGH (ref 11.5–15.5)
WBC: 7.5 10*3/uL (ref 4.0–10.5)

## 2016-06-04 LAB — POCT I-STAT, CHEM 8
BUN: 10 mg/dL (ref 6–20)
CALCIUM ION: 1.14 mmol/L — AB (ref 1.15–1.40)
Chloride: 93 mmol/L — ABNORMAL LOW (ref 101–111)
Creatinine, Ser: 0.6 mg/dL — ABNORMAL LOW (ref 0.61–1.24)
Glucose, Bld: 202 mg/dL — ABNORMAL HIGH (ref 65–99)
HCT: 26 % — ABNORMAL LOW (ref 39.0–52.0)
HEMOGLOBIN: 8.8 g/dL — AB (ref 13.0–17.0)
Potassium: 4.1 mmol/L (ref 3.5–5.1)
Sodium: 134 mmol/L — ABNORMAL LOW (ref 135–145)
TCO2: 28 mmol/L (ref 0–100)

## 2016-06-04 LAB — BASIC METABOLIC PANEL
Anion gap: 10 (ref 5–15)
BUN: 9 mg/dL (ref 6–20)
CO2: 30 mmol/L (ref 22–32)
Calcium: 7.8 mg/dL — ABNORMAL LOW (ref 8.9–10.3)
Chloride: 94 mmol/L — ABNORMAL LOW (ref 101–111)
Creatinine, Ser: 0.61 mg/dL (ref 0.61–1.24)
GFR calc Af Amer: >60 mL/min (ref >60)
GFR calc non Af Amer: >60 mL/min (ref >60)
Glucose, Bld: 62 mg/dL — ABNORMAL LOW (ref 65–99)
Potassium: 3.1 mmol/L — ABNORMAL LOW (ref 3.5–5.1)
Sodium: 134 mmol/L — ABNORMAL LOW (ref 135–145)

## 2016-06-04 LAB — COOXEMETRY PANEL
Carboxyhemoglobin: 1.6 % — ABNORMAL HIGH (ref 0.5–1.5)
Methemoglobin: 1 % (ref 0.0–1.5)
O2 Saturation: 68.7 %
Total hemoglobin: 8.1 g/dL — ABNORMAL LOW (ref 12.0–16.0)

## 2016-06-04 LAB — LACTATE DEHYDROGENASE: LDH: 225 U/L — ABNORMAL HIGH (ref 98–192)

## 2016-06-04 LAB — HEPARIN LEVEL (UNFRACTIONATED): Heparin Unfractionated: <0.1 IU/mL — ABNORMAL LOW (ref 0.30–0.70)

## 2016-06-04 LAB — PROTIME-INR
INR: 1.21
Prothrombin Time: 15.4 seconds — ABNORMAL HIGH (ref 11.4–15.2)

## 2016-06-04 LAB — MAGNESIUM: Magnesium: 2 mg/dL (ref 1.7–2.4)

## 2016-06-04 MED ORDER — POTASSIUM CHLORIDE 10 MEQ/50ML IV SOLN
INTRAVENOUS | Status: AC
  Filled 2016-06-04: qty 150

## 2016-06-04 MED ORDER — SODIUM CHLORIDE 0.9 % IV BOLUS (SEPSIS)
500.0000 mL | Freq: Once | INTRAVENOUS | Status: AC | PRN
  Administered 2016-06-04: 500 mL via INTRAVENOUS

## 2016-06-04 MED ORDER — SODIUM CHLORIDE 0.9 % IV BOLUS (SEPSIS)
500.0000 mL | Freq: Once | INTRAVENOUS | Status: AC
  Administered 2016-06-04: 500 mL via INTRAVENOUS

## 2016-06-04 MED ORDER — HEPARIN (PORCINE) IN NACL 100-0.45 UNIT/ML-% IJ SOLN
600.0000 [IU]/h | INTRAMUSCULAR | Status: DC
  Administered 2016-06-04 – 2016-06-07 (×3): 600 [IU]/h via INTRAVENOUS
  Filled 2016-06-04 (×5): qty 250

## 2016-06-04 MED ORDER — INSULIN DETEMIR 100 UNIT/ML ~~LOC~~ SOLN
15.0000 [IU] | Freq: Two times a day (BID) | SUBCUTANEOUS | Status: DC
  Administered 2016-06-04 (×2): 15 [IU] via SUBCUTANEOUS
  Filled 2016-06-04 (×3): qty 0.15

## 2016-06-04 MED ORDER — SODIUM CHLORIDE 0.9 % IV BOLUS (SEPSIS): 500.0000 mL | Freq: Once | INTRAVENOUS | Status: DC

## 2016-06-04 MED ORDER — POTASSIUM CHLORIDE 10 MEQ/50ML IV SOLN
10.0000 meq | INTRAVENOUS | Status: AC | PRN
  Administered 2016-06-04 (×3): 10 meq via INTRAVENOUS

## 2016-06-04 NOTE — Progress Notes (Addendum)
ANTICOAGULATION CONSULT NOTE - Follow Up Consult  Pharmacy Consult for Warfarin / Heparin  Indication: LVAD  Allergies  Allergen Reactions  . Metformin And Related Nausea And Vomiting    *Only the extended release* (does this)  . Niacin And Related Other (See Comments)    REACTION IS SIDE EFFECT "SEVERE" FLUSHING    Patient Measurements: Height: 6' (182.9 cm) Weight: 224 lb 6.9 oz (101.8 kg) IBW/kg (Calculated) : 77.6   Vital Signs: Temp: 98.3 F (36.8 C) (06/03 0700) Temp Source: Oral (06/03 0700) BP: 83/67 (06/03 0600) Pulse Rate: 91 (06/03 0600)  Labs:  Recent Labs  06/02/16 0453  06/03/16 0411 06/03/16 1739 06/04/16 0446  HGB 8.2*  < > 7.9* 8.5* 7.8*  HCT 25.0*  < > 24.4* 25.0* 24.1*  PLT 141*  --  158  --  184  LABPROT 15.4*  --  15.2  --  15.4*  INR 1.22  --  1.19  --  1.21  CREATININE 0.69  < > 0.71 0.60* 0.61  < > = values in this interval not displayed.  Estimated Creatinine Clearance: 119.7 mL/min (by C-G formula based on SCr of 0.61 mg/dL).     Assessment: 60yom with CAD- PCI LAD 1/18 on plavix PTA held for LVAD implant - [facility] 05/30/2016.   Warfarin started 5/30 post op day #1 INR remains low 1.2despite dose increased last pm.  H/h fell slightly though no obvious bleeding - s/p PRBC 6/1.   Will begin low dose heparin drip and if INR not close to 1.8 but POD#* discuss increase drip rate to HL goal 0.3-0.5.    Goal of Therapy:   INR 2-3 Monitor platelets by anticoagulation protocol: Yes   Plan:  Warfarin 7.5mg  x1 -repeat  Daily Protime, CBC, HL Begin heparin drip 600 uts/hr - Do not titrate HL 6hr after start to ensure not supratherapeutic  Leota SauersLisa Celese Banner Pharm.D. CPP, BCPS Clinical Pharmacist [phone] 06/04/2016 8:53 AM

## 2016-06-04 NOTE — Progress Notes (Signed)
ANTICOAGULATION CONSULT NOTE - Follow Up Consult  Pharmacy Consult for Warfarin / Heparin  Indication: LVAD  Allergies  Allergen Reactions  . Metformin And Related Nausea And Vomiting    *Only the extended release* (does this)  . Niacin And Related Other (See Comments)    REACTION IS SIDE EFFECT "SEVERE" FLUSHING    Patient Measurements: Height: 6' (182.9 cm) Weight: 224 lb 6.9 oz (101.8 kg) IBW/kg (Calculated) : 77.6   Vital Signs: Temp: 98.3 F (36.8 C) (06/03 2000) Temp Source: Oral (06/03 2000) BP: 98/87 (06/03 2100) Pulse Rate: 94 (06/03 2100)  Labs:  Recent Labs  06/02/16 0453  06/03/16 0411 06/03/16 1739 06/04/16 0446 06/04/16 1613 06/04/16 1952  HGB 8.2*  < > 7.9* 8.5* 7.8* 8.8*  --   HCT 25.0*  < > 24.4* 25.0* 24.1* 26.0*  --   PLT 141*  --  158  --  184  --   --   LABPROT 15.4*  --  15.2  --  15.4*  --   --   INR 1.22  --  1.19  --  1.21  --   --   HEPARINUNFRC  --   --   --   --   --   --  <0.10*  CREATININE 0.69  < > 0.71 0.60* 0.61 0.60*  --   < > = values in this interval not displayed.  Estimated Creatinine Clearance: 119.7 mL/min (A) (by C-G formula based on SCr of 0.6 mg/dL (L)).     Assessment: 60yom with CAD- PCI LAD 1/18 on plavix PTA held for LVAD implant - [facility] 05/30/2016.   Warfarin started 5/30 post op day #1 INR remains low and he is now on heparin.  -heparin level < 0.1; currently no titration  Will begin low dose heparin drip and if INR not close to 1.8 but POD#* discuss increase drip rate to HL goal 0.3-0.5.    Goal of Therapy:   INR 2-3 Monitor platelets by anticoagulation protocol: Yes   Plan:   -No heparin changes needed -Daily heparin level and CBC  Harland GermanAndrew Ailyn Gladd, Pharm D 06/04/2016 10:07 PM

## 2016-06-04 NOTE — Progress Notes (Addendum)
HVAD  Rounding Note  Subjective:    Post Op Day 5 HVAD   This am was feeling very weak. MAP 58-60. CVP 5-6  Also blood sugar low. (insulin was adjusted yesterday)  Lasix gtt stopped. Given 1L IVF. Feels much better. CVP 8. MAPs now in 70s  Attempted to drain pleurex catheter with no fluid back.   Hgb 7.5>8.2>7.9>7.8 LDH 225  LVAD INTERROGATION:  HVAD:  Flow  5.5 liters/min, RPM 2800  Watts 5.0  Peak 7.5, trough 3.8   Objective:    Vital Signs:   Temp:  [97.8 F (36.6 C)-99.4 F (37.4 C)] 98.3 F (36.8 C) (06/03 0700) Pulse Rate:  [89-103] 91 (06/03 0600) BP: (79-99)/(56-80) 83/67 (06/03 0600) SpO2:  [91 %-98 %] 96 % (06/03 0600) Weight:  [224 lb 6.9 oz (101.8 kg)] 224 lb 6.9 oz (101.8 kg) (06/03 0600) Last BM Date: 06/01/16 Mean arterial Pressure 58-70s  Intake/Output:   Intake/Output Summary (Last 24 hours) at 06/04/16 1103 Last data filed at 06/04/16 0600  Gross per 24 hour  Intake           1864.6 ml  Output             2250 ml  Net           -385.4 ml     Physical Exam: GENERAL: Sitting in bed. NAD HEENT: normal   NECK: Supple, RIJ TLC  2+ bilaterally, no bruits.  No lymphadenopathy or thyromegaly appreciated.   CARDIAC:  Mechanical heart sounds with LVAD hum present. Normal sound. Sternotomy looks good.  LUNGS:  Clear to auscultation bilaterally. No wheezing ABDOMEN:  Soft NT/ND, good BS LVAD exit site:  Dressing dry and intact. No signs infection EXTREMITIES:  Warm and dry, no cyanosis, clubbing, rash. Trace edema/+ hyperpigmentation noted.  NEUROLOGIC:  Alert and oriented x 4.  Moves all 4 without difficulty  Telemetry: Sinus 90 with PVCs. Personally reviewed  .    Labs: Basic Metabolic Panel:  Recent Labs Lab 05/31/16 0228 05/31/16 1607 06/01/16 0500  06/02/16 0453 06/02/16 1628 06/03/16 0411 06/03/16 1739 06/04/16 0446  NA 135 126* 130*  < > 135 134* 133* 134* 134*  K 4.1 3.6 3.9  < > 3.2* 3.6 2.7* 3.4* 3.1*  CL 101 95* 96*  < > 98*  92* 96* 91* 94*  CO2 27 25 26   --  29  --  29  --  30  GLUCOSE 108* 120* 203*  < > 104* 205* 118* 132* 62*  BUN 8 8 12   < > 12 13 11 9 9   CREATININE 0.66 0.73 0.78  < > 0.69 0.70 0.71 0.60* 0.61  CALCIUM 8.0* 7.8* 8.1*  --  8.3*  --  8.0*  --  7.8*  MG 2.4 2.1 2.0  --  1.8  --  1.7  --  2.0  PHOS 3.5  --  3.9  --   --   --   --   --   --   < > = values in this interval not displayed.  Liver Function Tests:  Recent Labs Lab 05/31/16 0228 06/01/16 0500 06/02/16 0453  AST 49* 41 23  ALT 14* 17 15*  ALKPHOS 51 67 64  BILITOT 1.7* 1.9* 1.4*  PROT 5.3* 5.6* 5.7*  ALBUMIN 2.9* 2.7* 2.4*   No results for input(s): LIPASE, AMYLASE in the last 168 hours. No results for input(s): AMMONIA in the last 168 hours.  CBC:  Recent Labs Lab  05/31/16 0228 05/31/16 1607 06/01/16 0500  06/02/16 0453 06/02/16 1628 06/03/16 0411 06/03/16 1739 06/04/16 0446  WBC 10.6* 9.8 10.8*  --  8.7  --  8.1  --  7.5  NEUTROABS 8.4*  --  8.9*  --  7.1  --  6.3  --  5.6  HGB 8.6* 7.8* 7.9*  < > 8.2* 8.8* 7.9* 8.5* 7.8*  HCT 26.0* 23.9* 24.1*  < > 25.0* 26.0* 24.4* 25.0* 24.1*  MCV 86.4 86.6 86.4  --  87.4  --  87.1  --  87.0  PLT 145* 125* 123*  --  141*  --  158  --  184  < > = values in this interval not displayed.  INR:  Recent Labs Lab 05/31/16 0228 06/01/16 0500 06/02/16 0453 06/03/16 0411 06/04/16 0446  INR 1.19 1.21 1.22 1.19 1.21    Other results:   Imaging: Dg Chest Port 1 View  Result Date: 06/03/2016 CLINICAL DATA:  Followup left ventricular assist device. EXAM: PORTABLE CHEST 1 VIEW COMPARISON:  06/02/2016 FINDINGS: Pacemaker/ AICD is unchanged. LVAD device appears the same. Right internal jugular central line tip is in the SVC 4 cm above the right atrium. Interstitial and alveolar edema persists cyst with small effusions. No change since yesterday. IMPRESSION: No change. Persistent interstitial and alveolar edema with small effusions. No unexpected finding relative to the LVAD.  Electronically Signed   By: Paulina Fusi M.D.   On: 06/03/2016 06:57     Medications:     Scheduled Medications: . acetaminophen  1,000 mg Oral Q6H   Or  . acetaminophen (TYLENOL) oral liquid 160 mg/5 mL  1,000 mg Per Tube Q6H  . amiodarone  200 mg Oral BID  . aspirin EC  325 mg Oral Daily   Or  . aspirin  324 mg Per Tube Daily   Or  . aspirin  300 mg Rectal Daily  . bisacodyl  10 mg Oral Daily   Or  . bisacodyl  10 mg Rectal Daily  . docusate sodium  200 mg Oral Daily  . ferrous fumarate-b12-vitamic C-folic acid  1 capsule Oral BID BM  . guaiFENesin  600 mg Oral BID  . insulin aspart  0-24 Units Subcutaneous Q4H  . insulin detemir  15 Units Subcutaneous BID  . mouth rinse  15 mL Mouth Rinse BID  . metoCLOPramide (REGLAN) injection  10 mg Intravenous Q6H  . pantoprazole  40 mg Oral Daily  . potassium chloride  40 mEq Oral BID  . sildenafil  20 mg Oral TID  . sodium chloride flush  3 mL Intravenous Q12H  . spironolactone  25 mg Oral Daily  . warfarin  7.5 mg Oral q1800  . Warfarin - Pharmacist Dosing Inpatient   Does not apply q1800    Infusions: . sodium chloride Stopped (05/31/16 1800)  . sodium chloride    . sodium chloride 20 mL (05/30/16 2201)  . sodium chloride    . lactated ringers Stopped (06/01/16 0900)  . lactated ringers 20 mL/hr at 06/04/16 0600  . milrinone 0.125 mcg/kg/min (06/04/16 0600)    PRN Medications: sodium chloride, fentaNYL (SUBLIMAZE) injection, hydrALAZINE, midazolam, ondansetron (ZOFRAN) IV, oxyCODONE, sodium chloride flush, traMADol   Assessment/Plan/Discussion    1. A/C Systolic Heart Failure: 05/26/16 ECHO EF 20-25%. - S/P HVAD 05/30/16 - Remains on milrinone 0.125. Co-ox 69%. Will stop milrinone today. Check co-ox in am. If ok can pull central line and send to 2W tomorrow..  - Was hypotensive and  hypovolemic this am. Lasix gtt off. Entresto stopped. Improved with NS - 1L bolus. Hold diuretics for now.  - VAD parameters stable -  Continue 324 mg aspirin + coumadin. INR 1.21. Will start heparin (no bolus). Adjust coumadin. Discussed with PharmD and Dr. Donata ClayVan Trigt.  - VAD education being done by nurses at [facility] at bedside.  3. R Pleural Effusion: - No draining from Pleurex catheter. Check CXR in am. May be able to d/c.  4. CAD:  -3V CAD S/P stent Prox LAD and PTCA to subtotally occluded OMI 01/2016  -Eventually consider adding Plavix back 5. Pulmonary HTN: PA catheter out.  PA pressures were lower post-LVAD.  - Pre-op, he was on Revatio 20 mg tid.  Continue revatio 20 mg tid.  6. Former Smoker.   7. HTN:   - MAPs were high. Losartan switched to Entresto 24/26. Stopped today due to low BP. As BP climbs can add back Entresto carefully (once volume status improved) - Continue prn hydralazine 8. Anemia -Hgb drifting back down. 7.8 today. Got 1u PRBCs on 6/1. May need another. No obvious bleeding. Continue protonix. Keep active T&S.  9. DM2 -Was hypoglycemic this am. Insulin cut back this am. Will likely need to increase back up eventually.  10. Hypokalemia -Will supp  I reviewed the HVAD parameters from today, and compared the results to the patient's prior recorded data.  No programming changes were made.  The HVAD is functioning within specified parameters.    Length of Stay: 11  Arvilla MeresBensimhon, Terasa Orsini MD 06/04/2016, 11:03 AM  VAD Team --- VAD ISSUES ONLY--- Pager [phone] (7am - 7am)

## 2016-06-05 ENCOUNTER — Encounter (HOSPITAL_COMMUNITY): Payer: BLUE CROSS/BLUE SHIELD

## 2016-06-05 ENCOUNTER — Inpatient Hospital Stay (HOSPITAL_COMMUNITY): Payer: BLUE CROSS/BLUE SHIELD

## 2016-06-05 DIAGNOSIS — I5023 Acute on chronic systolic (congestive) heart failure: Secondary | ICD-10-CM

## 2016-06-05 DIAGNOSIS — Z95811 Presence of heart assist device: Secondary | ICD-10-CM

## 2016-06-05 LAB — CBC WITH DIFFERENTIAL/PLATELET
Basophils Absolute: 0 10*3/uL (ref 0.0–0.1)
Basophils Relative: 1 %
Eosinophils Absolute: 0.3 10*3/uL (ref 0.0–0.7)
Eosinophils Relative: 4 %
HEMATOCRIT: 25 % — AB (ref 39.0–52.0)
Hemoglobin: 7.8 g/dL — ABNORMAL LOW (ref 13.0–17.0)
LYMPHS ABS: 0.7 10*3/uL (ref 0.7–4.0)
LYMPHS PCT: 11 %
MCH: 27.5 pg (ref 26.0–34.0)
MCHC: 31.2 g/dL (ref 30.0–36.0)
MCV: 88 fL (ref 78.0–100.0)
MONO ABS: 0.8 10*3/uL (ref 0.1–1.0)
MONOS PCT: 13 %
NEUTROS ABS: 4.4 10*3/uL (ref 1.7–7.7)
Neutrophils Relative %: 71 %
Platelets: 203 10*3/uL (ref 150–400)
RBC: 2.84 MIL/uL — ABNORMAL LOW (ref 4.22–5.81)
RDW: 17 % — ABNORMAL HIGH (ref 11.5–15.5)
WBC: 6.2 10*3/uL (ref 4.0–10.5)

## 2016-06-05 LAB — BASIC METABOLIC PANEL
Anion gap: 5 (ref 5–15)
BUN: 9 mg/dL (ref 6–20)
CHLORIDE: 99 mmol/L — AB (ref 101–111)
CO2: 29 mmol/L (ref 22–32)
Calcium: 8 mg/dL — ABNORMAL LOW (ref 8.9–10.3)
Creatinine, Ser: 0.51 mg/dL — ABNORMAL LOW (ref 0.61–1.24)
GFR calc Af Amer: >60 mL/min (ref >60)
Glucose, Bld: 60 mg/dL — ABNORMAL LOW (ref 65–99)
POTASSIUM: 4.1 mmol/L (ref 3.5–5.1)
SODIUM: 133 mmol/L — AB (ref 135–145)

## 2016-06-05 LAB — GLUCOSE, CAPILLARY
GLUCOSE-CAPILLARY: 57 mg/dL — AB (ref 65–99)
GLUCOSE-CAPILLARY: 183 mg/dL — AB (ref 65–99)
GLUCOSE-CAPILLARY: 171 mg/dL — AB (ref 65–99)
Glucose-Capillary: 138 mg/dL — ABNORMAL HIGH (ref 65–99)
Glucose-Capillary: 102 mg/dL — ABNORMAL HIGH (ref 65–99)
Glucose-Capillary: 75 mg/dL (ref 65–99)
Glucose-Capillary: 101 mg/dL — ABNORMAL HIGH (ref 65–99)

## 2016-06-05 LAB — HEPARIN LEVEL (UNFRACTIONATED): Heparin Unfractionated: <0.1 IU/mL — ABNORMAL LOW (ref 0.30–0.70)

## 2016-06-05 LAB — MAGNESIUM: Magnesium: 2.1 mg/dL (ref 1.7–2.4)

## 2016-06-05 LAB — POCT I-STAT, CHEM 8
BUN: 11 mg/dL (ref 6–20)
CALCIUM ION: 1.14 mmol/L — AB (ref 1.15–1.40)
Chloride: 89 mmol/L — ABNORMAL LOW (ref 101–111)
Creatinine, Ser: 0.6 mg/dL — ABNORMAL LOW (ref 0.61–1.24)
GLUCOSE: 107 mg/dL — AB (ref 65–99)
HCT: 25 % — ABNORMAL LOW (ref 39.0–52.0)
Hemoglobin: 8.5 g/dL — ABNORMAL LOW (ref 13.0–17.0)
POTASSIUM: 3.3 mmol/L — AB (ref 3.5–5.1)
SODIUM: 133 mmol/L — AB (ref 135–145)
TCO2: 29 mmol/L (ref 0–100)

## 2016-06-05 LAB — LACTATE DEHYDROGENASE: LDH: 231 U/L — ABNORMAL HIGH (ref 98–192)

## 2016-06-05 LAB — COOXEMETRY PANEL
Carboxyhemoglobin: 2 % — ABNORMAL HIGH (ref 0.5–1.5)
METHEMOGLOBIN: 0.6 % (ref 0.0–1.5)
O2 Saturation: 62.8 %
TOTAL HEMOGLOBIN: 14.4 g/dL (ref 12.0–16.0)

## 2016-06-05 LAB — PROTIME-INR
INR: 1.53
PROTHROMBIN TIME: 18.6 s — AB (ref 11.4–15.2)

## 2016-06-05 MED ORDER — LOSARTAN POTASSIUM 25 MG PO TABS
25.0000 mg | ORAL_TABLET | Freq: Two times a day (BID) | ORAL | Status: DC
  Administered 2016-06-05 – 2016-06-11 (×13): 25 mg via ORAL
  Filled 2016-06-05 (×13): qty 1

## 2016-06-05 MED ORDER — FUROSEMIDE 10 MG/ML IJ SOLN
80.0000 mg | Freq: Every day | INTRAMUSCULAR | Status: DC
  Administered 2016-06-05: 80 mg via INTRAVENOUS
  Filled 2016-06-05: qty 8

## 2016-06-05 MED ORDER — FUROSEMIDE 10 MG/ML IJ SOLN: 40.0000 mg | Freq: Every day | INTRAMUSCULAR | Status: DC

## 2016-06-05 MED ORDER — SODIUM CHLORIDE 0.9 % IV SOLN
INTRAVENOUS | Status: DC
  Administered 2016-06-05 – 2016-06-07 (×2): via INTRAVENOUS

## 2016-06-05 MED ORDER — LOSARTAN POTASSIUM 25 MG PO TABS: 25.0000 mg | ORAL_TABLET | Freq: Two times a day (BID) | ORAL | Status: DC

## 2016-06-05 MED ORDER — LOSARTAN POTASSIUM 25 MG PO TABS
25.0000 mg | ORAL_TABLET | Freq: Every day | ORAL | Status: DC
  Administered 2016-06-05: 25 mg via ORAL
  Filled 2016-06-05: qty 1

## 2016-06-05 MED ORDER — LOSARTAN POTASSIUM 25 MG PO TABS
25.0000 mg | ORAL_TABLET | Freq: Once | ORAL | Status: DC
  Filled 2016-06-05: qty 1

## 2016-06-05 MED ORDER — INSULIN ASPART 100 UNIT/ML ~~LOC~~ SOLN
0.0000 [IU] | Freq: Every day | SUBCUTANEOUS | Status: DC
  Administered 2016-06-08 – 2016-06-12 (×2): 2 [IU] via SUBCUTANEOUS

## 2016-06-05 MED ORDER — INSULIN ASPART 100 UNIT/ML ~~LOC~~ SOLN
0.0000 [IU] | Freq: Three times a day (TID) | SUBCUTANEOUS | Status: DC
  Administered 2016-06-05 – 2016-06-06 (×2): 3 [IU] via SUBCUTANEOUS
  Administered 2016-06-06: 5 [IU] via SUBCUTANEOUS
  Administered 2016-06-07: 3 [IU] via SUBCUTANEOUS
  Administered 2016-06-07: 2 [IU] via SUBCUTANEOUS
  Administered 2016-06-08: 3 [IU] via SUBCUTANEOUS
  Administered 2016-06-08: 2 [IU] via SUBCUTANEOUS
  Administered 2016-06-09 (×2): 3 [IU] via SUBCUTANEOUS
  Administered 2016-06-09 – 2016-06-10 (×2): 5 [IU] via SUBCUTANEOUS
  Administered 2016-06-10 – 2016-06-11 (×3): 8 [IU] via SUBCUTANEOUS
  Administered 2016-06-12: 2 [IU] via SUBCUTANEOUS
  Administered 2016-06-12: 11 [IU] via SUBCUTANEOUS
  Administered 2016-06-12: 3 [IU] via SUBCUTANEOUS
  Administered 2016-06-13: 2 [IU] via SUBCUTANEOUS

## 2016-06-05 MED ORDER — INSULIN DETEMIR 100 UNIT/ML ~~LOC~~ SOLN
18.0000 [IU] | Freq: Two times a day (BID) | SUBCUTANEOUS | Status: DC
  Administered 2016-06-05 – 2016-06-06 (×4): 18 [IU] via SUBCUTANEOUS
  Filled 2016-06-05 (×5): qty 0.18

## 2016-06-05 NOTE — Progress Notes (Signed)
HVAD  Rounding Note  Postop day 6  Subjective:   61 year old patient with ischemic cardiomyopathy and acute on chronic systolic heart failure who presented with fatigue and failure to thrive despite home IV milrinone and maximal medical therapy including daily drainage of pleural effusion with right Pleurx catheter  The patient was assessed and found to be candidate for destination therapy LVAD implantation. He was optimized with diuresis and Plavix washout prior to surgical implantation  The patient had a stable night with good VAD parameters No alarms. Feels better today MAP improved but wt up 2 lbs Co-ox off milrinone 62%- will DC central line Remains on iv heparin until INR > 1.8   Postoperative coagulopathy  related to preoperative Plavix treated with platelet transfusion .  Expected postop blood loss anemia. Received 1 u PRBC postop for Hb 7.5 Coumadin per PharmD.  Chest x-ray shows good VAD orientation and no significant pleural effusion. No drainage from Pleurx. Will check again wed then remove if no change when off iv heparin   BP goal map < 85-90  Good pulsatility on VAD waveforms  LVAD INTERROGATION:  VAD:  Flow 4.89ters/min, speed 2800 rpm , power 5.1,   Controller intact  Objective:    Vital Signs:   Temp:  [98.3 F (36.8 C)-99 F (37.2 C)] 99 F (37.2 C) (06/04 0758) Pulse Rate:  [68-102] 101 (06/04 0700) BP: (83-114)/(73-97) 114/97 (06/04 0800) SpO2:  [93 %-99 %] 99 % (06/04 0700) Weight:  [230 lb 2.6 oz (104.4 kg)] 230 lb 2.6 oz (104.4 kg) (06/04 0600) Last BM Date: 06/04/16 Mean arterial Pressure 88 mmHg  Intake/Output:   Intake/Output Summary (Last 24 hours) at 06/05/16 0850 Last data filed at 06/05/16 0800  Gross per 24 hour  Intake           1241.9 ml  Output             1895 ml  Net           -653.1 ml     Physical Exam: General:  Well appearing. No resp difficulty OOB in chair HEENT: normal Neck: supple. JVP . Carotids 2+ bilat; no bruits.  No lymphadenopathy or thryomegaly appreciated. Cor: Mechanical heart sounds with LVAD hum present. Lungs: clear Abdomen: soft, nontender, nondistended. No hepatosplenomegaly. No bruits or masses. Good bowel sounds. Extremities: no cyanosis, clubbing, rash, edema Neuro: alert & orientedx3, cranial nerves grossly intact. moves all 4 extremities w/o difficulty. Affect pleasant  Telemetry:paced 90  Labs: Basic Metabolic Panel:  Recent Labs Lab 05/31/16 0228  06/01/16 0500  06/02/16 0453  06/03/16 0411 06/03/16 1739 06/04/16 0446 06/04/16 1613 06/05/16 0624  NA 135  < > 130*  < > 135  < > 133* 134* 134* 134* 133*  K 4.1  < > 3.9  < > 3.2*  < > 2.7* 3.4* 3.1* 4.1 4.1  CL 101  < > 96*  < > 98*  < > 96* 91* 94* 93* 99*  CO2 27  < > 26  --  29  --  29  --  30  --  29  GLUCOSE 108*  < > 203*  < > 104*  < > 118* 132* 62* 202* 60*  BUN 8  < > 12  < > 12  < > 11 9 9 10 9   CREATININE 0.66  < > 0.78  < > 0.69  < > 0.71 0.60* 0.61 0.60* 0.51*  CALCIUM 8.0*  < > 8.1*  --  8.3*  --  8.0*  --  7.8*  --  8.0*  MG 2.4  < > 2.0  --  1.8  --  1.7  --  2.0  --  2.1  PHOS 3.5  --  3.9  --   --   --   --   --   --   --   --   < > = values in this interval not displayed.  Liver Function Tests:  Recent Labs Lab 05/31/16 0228 06/01/16 0500 06/02/16 0453  AST 49* 41 23  ALT 14* 17 15*  ALKPHOS 51 67 64  BILITOT 1.7* 1.9* 1.4*  PROT 5.3* 5.6* 5.7*  ALBUMIN 2.9* 2.7* 2.4*   No results for input(s): LIPASE, AMYLASE in the last 168 hours. No results for input(s): AMMONIA in the last 168 hours.  CBC:  Recent Labs Lab 06/01/16 0500  06/02/16 0453  06/03/16 0411 06/03/16 1739 06/04/16 0446 06/04/16 1613 06/05/16 0624  WBC 10.8*  --  8.7  --  8.1  --  7.5  --  6.2  NEUTROABS 8.9*  --  7.1  --  6.3  --  5.6  --  4.4  HGB 7.9*  < > 8.2*  < > 7.9* 8.5* 7.8* 8.8* 7.8*  HCT 24.1*  < > 25.0*  < > 24.4* 25.0* 24.1* 26.0* 25.0*  MCV 86.4  --  87.4  --  87.1  --  87.0  --  88.0  PLT 123*  --   141*  --  158  --  184  --  203  < > = values in this interval not displayed.  INR:  Recent Labs Lab 06/01/16 0500 06/02/16 0453 06/03/16 0411 06/04/16 0446 06/05/16 0624  INR 1.21 1.22 1.19 1.21 1.53    Other results:  EKG:   Imaging: Dg Chest Port 1 View  Result Date: 06/05/2016 CLINICAL DATA:  Right pleural effusion. EXAM: PORTABLE CHEST 1 VIEW COMPARISON:  06/03/2016 FINDINGS: The cardio pericardial silhouette is enlarged. Interstitial markings are diffusely coarsened with chronic features. Small pleural effusions, right greater than left, no substantial change. The LVAD again noted. Left pacer/ AICD a remains in place. Right IJ central line tip overlies the proximal to mid SVC. IMPRESSION: Cardiomegaly with underlying interstitial changes and small bilateral effusions, right greater than left. Electronically Signed   By: Kennith CenterEric  Mansell M.D.   On: 06/05/2016 07:06     Medications:     Scheduled Medications: . amiodarone  200 mg Oral BID  . aspirin EC  325 mg Oral Daily   Or  . aspirin  324 mg Per Tube Daily   Or  . aspirin  300 mg Rectal Daily  . bisacodyl  10 mg Oral Daily   Or  . bisacodyl  10 mg Rectal Daily  . docusate sodium  200 mg Oral Daily  . ferrous fumarate-b12-vitamic C-folic acid  1 capsule Oral BID BM  . furosemide  80 mg Intravenous Daily  . guaiFENesin  600 mg Oral BID  . insulin aspart  0-15 Units Subcutaneous TID WC  . insulin aspart  0-5 Units Subcutaneous QHS  . insulin detemir  18 Units Subcutaneous BID  . losartan  25 mg Oral Daily  . mouth rinse  15 mL Mouth Rinse BID  . pantoprazole  40 mg Oral Daily  . potassium chloride  40 mEq Oral BID  . sildenafil  20 mg Oral TID  . sodium chloride flush  3 mL Intravenous  Q12H  . spironolactone  25 mg Oral Daily  . warfarin  7.5 mg Oral q1800  . Warfarin - Pharmacist Dosing Inpatient   Does not apply q1800    Infusions: . sodium chloride    . sodium chloride    . sodium chloride    . heparin  600 Units/hr (06/05/16 0400)    PRN Medications: hydrALAZINE, midazolam, ondansetron (ZOFRAN) IV, oxyCODONE, sodium chloride flush, traMADol   Assessment:  Acute on chronic systolic heart failure from ischemic cardiomyopathy Previous placement of AICD pacemaker system Preoperative pulmonary  hypertension treated with sildenafil  Recent smoker history   Plan/Discussion:    Patient showing excellent early recovery  Needs diuresis and BP control- CXR w/ mild edema Coumadin  7.5 mg/day-  heparin bridge until INR therapeutic Transfer to stepdown- he walked > 700 ft today Incisions all clean and dry Cont patient /family education I reviewed the LVAD parameters from today, and compared the results to the patient's prior recorded data.  No programming changes were made.  The LVAD is functioning within specified parameters.  The patient performs LVAD self-test daily.  LVAD interrogation was negative for any significant power changes, alarms or PI events/speed drops.  LVAD equipment check completed and is in good working order.  Back-up equipment present.   LVAD education done on emergency procedures and precautions and reviewed exit site care.  Length of Stay: [address]  Kathlee Nations Trigt III 06/05/2016, 8:50 AM

## 2016-06-05 NOTE — Progress Notes (Signed)
  Following patients BP closely.  Recorded MAP at 1100 am 97.    Check personally in room using doppler and MAP 72.   Continue current meds.   Have increased Losartan to 25 mg BID per Dr. Lillette BoxerMcLean     Bodee Lafoe "7350 Thatcher RoadAndy" [place], [place]-C 06/05/2016 2:54 PM

## 2016-06-05 NOTE — Progress Notes (Signed)
Occupational Therapy Treatment Patient Details Name: [name] MRN: [id number] DOB: 1955/08/09 Today's Date: 06/05/2016    History of present illness Pt adm with  Acute on chronic systolic CHF due to ICM and plans for LVAD after insurance approval. 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 PMH - Rt pleural effusion with pleurx, CHF, DM, HTN, CAD, PVD. BLE bypass grafts, AICD   OT comments  Pt is making excellent progress.  He is able to perform ADLs with min guard assist to min A.  He requires reinforcement to adhere to sternal precautions.    Follow Up Recommendations  No OT follow up;Supervision/Assistance - 24 hour    Equipment Recommendations  3 in 1 bedside commode;Tub/shower seat    Recommendations for Other Services      Precautions / Restrictions Precautions Precautions: Sternal;Fall Precaution Comments: reviewed sternal precautions with pt and reinforced need to adhere to precautions.  He did admit to pulling self up from toilet  Restrictions Weight Bearing Restrictions: Yes (sternal precautions) Other Position/Activity Restrictions: sternal precautions        Mobility Bed Mobility               General bed mobility comments: Pt up in chair  Transfers Overall transfer level: Needs assistance Equipment used: None (pushed IV pole half the time ) Transfers: Sit to/from Stand;Stand Pivot Transfers Sit to Stand: +2 safety/equipment;Min assist Stand pivot transfers: Min guard            Balance Overall balance assessment: Needs assistance Sitting-balance support: No upper extremity supported;Feet supported Sitting balance-Leahy Scale: Good     Standing balance support: Single extremity supported Standing balance-Leahy Scale: Poor Standing balance comment: UE support                           ADL either performed or assessed with clinical judgement   ADL Overall ADL's : Needs assistance/impaired                     Lower Body  Dressing: Minimal assistance;Sit to/from stand Lower Body Dressing Details (indicate cue type and reason): requires asssist to pull socks over toes  Toilet Transfer: Min guard;Ambulation;Comfort height toilet;BSC   Toileting- Clothing Manipulation and Hygiene: Min guard;Sit to/from stand       Functional mobility during ADLs: Minimal assistance;+2 for physical assistance (no DME ) General ADL Comments: Pt and father encouraged to bring in comfortable clothing and for pt to start to wear clothes      Vision       Perception     Praxis      Cognition Arousal/Alertness: Awake/alert Behavior During Therapy: WFL for tasks assessed/performed Overall Cognitive Status: Within Functional Limits for tasks assessed                                          Exercises     Shoulder Instructions       General Comments Pt ambulated without RW requiring bil. HHA after ~350' due to fatigue.  Pt does not self monitor well.  He was able to instruct therapist in his LVAD equipment and was able to teach back how to switch from battery to [facility].     Pertinent Vitals/ Pain       Pain Assessment: Faces Faces Pain Scale: Hurts a little bit Pain Location: chest/incision Pain  Descriptors / Indicators: Grimacing Pain Intervention(s): Monitored during session  Home Living                                          Prior Functioning/Environment              Frequency  Min 2X/week        Progress Toward Goals  OT Goals(current goals can now be found in the care plan section)  Progress towards OT goals: Progressing toward goals     Plan Discharge plan remains appropriate    Co-evaluation    PT/OT/SLP Co-Evaluation/Treatment: Yes Reason for Co-Treatment: For patient/therapist safety   OT goals addressed during session: ADL's and self-care      AM-PAC PT "6 Clicks" Daily Activity     Outcome Measure   Help from another person eating meals?:  None Help from another person taking care of personal grooming?: A Little Help from another person toileting, which includes using toliet, bedpan, or urinal?: A Little Help from another person bathing (including washing, rinsing, drying)?: A Little Help from another person to put on and taking off regular upper body clothing?: A Little Help from another person to put on and taking off regular lower body clothing?: A Little 6 Click Score: 19    End of Session Equipment Utilized During Treatment: Gait belt  OT Visit Diagnosis: Unsteadiness on feet (R26.81)   Activity Tolerance Patient tolerated treatment well   Patient Left in chair;with call bell/phone within reach;with family/visitor present   Nurse Communication          Time: 1333-1410 OT Time Calculation (min): 37 min  Charges: OT General Charges $OT Visit: 1 Procedure OT Treatments $Self Care/Home Management : 8-22 mins  Reynolds AmericanWendi Sandra Brents, OTR/L [phone]    Jeani HawkingConarpe, Evo Aderman M 06/05/2016, 2:57 PM

## 2016-06-05 NOTE — Progress Notes (Addendum)
LVAD Inpatient Rounding Note:  Heartware LVAD implanted on 5/29/2018by Tharon Aquas Trigt.  Vital signs: HR: 89 Doppler Pressure: 78 Automatic BP: 105/90 (97) O2 Sat: 99 on RA Wt in lbs: 200 > 216> 230   LVAD interrogation reveals:  Speed: 2800 Flow: 5.5 Power: 5 Peak /Trough: 3/7 Alarms: none   Suction alarm ON  Alarm settings: Low flow: 3.0 High Power: 7.0   Drive Line:  Daily using daily dressing kit with aquacel silver strips on exit site  Gtts:  none  Blood products: 1 PC and 4 Plasma - in OR 05/30/16  ICD: St Jude single lead Therapy off- will have rep turn it back on before transfer to floor VVI 40  Labs:  LDH trend: 234>237>225>231  INR trend: 1.36 > 1.19>1.19>1.21>1.53  Anticoagulation Plan: -INR Goal: 2-2.5 -ASA Dose: 325 (started 05/31/16)  Adverse Events on VAD: -none  Plan/Recommendations:   1. Daily dressing changes. 2. If intermittent beeping with "connect power" re-occurs, do not remove either power source. Assure both power sources (either batteries x 2 or battery/AC power) are connected correctly. Page VAD pager if any issues.  3. To 2 west today   Balinda Quails RN, [place] Coordinator 24/7 pager [phone]

## 2016-06-05 NOTE — Progress Notes (Addendum)
Physical Therapy Treatment Patient Details Name: [name] MRN: [id number] DOB: 04/15/1955 Today's Date: 06/05/2016    History of Present Illness Pt adm with  Acute on chronic systolic CHF due to ICM and plans for LVAD after insurance approval. 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 PMH - Rt pleural effusion with pleurx, CHF, DM, HTN, CAD, PVD. BLE bypass grafts, AICD    PT Comments    Pt admitted with above diagnosis. Pt currently with functional limitations due to balance and endurance deficits. Pt was able to ambulate in hallway some with out device but fatigued and needed bil HHA for last 200 feet of walk.  Pt does not self monitor well and discussed this. Pt is doing well with teach back regarding equipment.   Pt will benefit from skilled PT to increase their independence and safety with mobility to allow discharge to the venue listed below.     Follow Up Recommendations  No PT follow up     Equipment Recommendations  Other (comment) (?rollator)    Recommendations for Other Services       Precautions / Restrictions Precautions Precautions: Sternal;Fall Precaution Comments: reviewed sternal precautions with pt and reinforced need to adhere to precautions.  He did admit to pulling self up from toilet  Restrictions Weight Bearing Restrictions: Yes (sternal precautions) Other Position/Activity Restrictions: sternal precautions     Mobility  Bed Mobility               General bed mobility comments: Pt up on toilet  Transfers Overall transfer level: Needs assistance Equipment used: None (pushed IV pole half the time ) Transfers: Sit to/from Stand;Stand Pivot Transfers Sit to Stand: +2 safety/equipment;Min assist Stand pivot transfers: Min guard       General transfer comment: Stood to get off toilet prior to PT getting into bathrrom.   Ambulation/Gait Ambulation/Gait assistance: Min guard;+2 safety/equipment Ambulation Distance (Feet): 500 Feet Assistive device:  None (Iv pole) Gait Pattern/deviations: Step-through pattern;Decreased stride length Gait velocity: decr Gait velocity interpretation: Below normal speed for age/gender General Gait Details: Assist for safety and support. HR to 123 with amb.  DOE 3/4 toward mid to end of walk.  Pt at times ambulated without holding anything but toward last 200 feet of walk did better with HHA of 1 and holding Iv pole with other hand.     Stairs            Wheelchair Mobility    Modified Rankin (Stroke Patients Only)       Balance Overall balance assessment: Needs assistance Sitting-balance support: No upper extremity supported;Feet supported Sitting balance-Leahy Scale: Good     Standing balance support: Single extremity supported Standing balance-Leahy Scale: Poor Standing balance comment: UE support needed for stability                             Cognition Arousal/Alertness: Awake/alert Behavior During Therapy: WFL for tasks assessed/performed Overall Cognitive Status: Within Functional Limits for tasks assessed                                        Exercises      General Comments General comments (skin integrity, edema, etc.): Pt was able to teach back regarding his LVAD equipment and switching from battery to [facility].  Pt did need cues as he was unable to self monitor  and fatigues.        Pertinent Vitals/Pain Pain Assessment: Faces Faces Pain Scale: Hurts a little bit Pain Location: chest/incision Pain Descriptors / Indicators: Grimacing Pain Intervention(s): Limited activity within patient's tolerance;Monitored during session;Premedicated before session;Repositioned  LVAD parameters did well and HR up to 124 bpm with activity.   Home Living                      Prior Function            PT Goals (current goals can now be found in the care plan section) Progress towards PT goals: Progressing toward goals    Frequency    Min  3X/week      PT Plan Current plan remains appropriate    Co-evaluation PT/OT/SLP Co-Evaluation/Treatment: Yes Reason for Co-Treatment: For patient/therapist safety PT goals addressed during session: Mobility/safety with mobility OT goals addressed during session: ADL's and self-care      AM-PAC PT "6 Clicks" Daily Activity  Outcome Measure  Difficulty turning over in bed (including adjusting bedclothes, sheets and blankets)?: A Lot Difficulty moving from lying on back to sitting on the side of the bed? : A Lot Difficulty sitting down on and standing up from a chair with arms (e.g., wheelchair, bedside commode, etc,.)?: A Lot Help needed moving to and from a bed to chair (including a wheelchair)?: A Little Help needed walking in hospital room?: A Little Help needed climbing 3-5 steps with a railing? : A Lot 6 Click Score: 14    End of Session Equipment Utilized During Treatment: Gait belt Activity Tolerance: Patient limited by fatigue Patient left: in chair;with call bell/phone within reach;with family/visitor present Nurse Communication: Mobility status PT Visit Diagnosis: Unsteadiness on feet (R26.81);Difficulty in walking, not elsewhere classified (R26.2)     Time: [id number] PT Time Calculation (min) (ACUTE ONLY): 33 min  Charges:  $Gait Training: 8-22 mins                    G Codes:       Emmelia Holdsworth,PT Acute Rehabilitation [phone] (pager)    Berline Lopesawn F Jakeisha Stricker 06/05/2016, 3:31 PM

## 2016-06-05 NOTE — Progress Notes (Signed)
Patient ID: Dalton RainwaterLarry D Hartland, male   DOB: 02-13-1955, 61 y.o.   MRN: [id number]   HVAD  Rounding Note  Subjective:    Post Op Day 6 HVAD   Entresto stopped 6/3 with fall in MAP.  Lasix gtt stopped with CVP 5-6.  Today, CVP up to 13 with MAP to 90s.  Not sure how accurate weight is today. Co-ox 63% off milrinone.    Feels good this morning, has walked.  Not dyspneic.    Attempted to drain PleurX catheter yesterday with no fluid back.   Hgb 7.5>8.2>7.9>7.8>7.8 LDH 225>231  LVAD INTERROGATION:  HVAD:  Flow  5.9 liters/min, RPM 2800  Power 5.1  Peak 7.5, trough 4.5   Objective:    Vital Signs:   Temp:  [98.3 F (36.8 C)-99 F (37.2 C)] 99 F (37.2 C) (06/04 0758) Pulse Rate:  [68-102] 101 (06/04 0700) BP: (83-113)/(73-97) 112/97 (06/04 0700) SpO2:  [93 %-99 %] 99 % (06/04 0700) Weight:  [230 lb 2.6 oz (104.4 kg)] 230 lb 2.6 oz (104.4 kg) (06/04 0600) Last BM Date: 06/04/16 Mean arterial Pressure 80s-90s  Intake/Output:   Intake/Output Summary (Last 24 hours) at 06/05/16 0818 Last data filed at 06/05/16 0600  Gross per 24 hour  Intake           1241.9 ml  Output             1585 ml  Net           -343.1 ml     Physical Exam: GENERAL: NAD HEENT: normal   NECK: Supple, RIJ TLC  2+ bilaterally, no bruits.  No lymphadenopathy or thyromegaly appreciated.   CARDIAC:  Mechanical heart sounds with LVAD hum present. Normal sound. Sternotomy looks good.  LUNGS:  Mildly decreased breath sounds right base.  ABDOMEN:  Soft NT/ND, good BS LVAD exit site:  Dressing dry and intact. No signs infection EXTREMITIES:  Warm and dry, no cyanosis, clubbing, rash. No edema but + hyperpigmentation noted.  NEUROLOGIC:  Alert and oriented x 4.  Moves all 4 without difficulty  Telemetry: Sinus 90s. Personally reviewed  .    Labs: Basic Metabolic Panel:  Recent Labs Lab 05/31/16 0228 05/31/16 1607 06/01/16 0500  06/02/16 0453 06/02/16 1628 06/03/16 0411 06/03/16 1739 06/04/16 0446  06/04/16 1613 06/05/16 0624  NA 135 126* 130*  < > 135 134* 133* 134* 134* 134*  --   K 4.1 3.6 3.9  < > 3.2* 3.6 2.7* 3.4* 3.1* 4.1  --   CL 101 95* 96*  < > 98* 92* 96* 91* 94* 93*  --   CO2 27 25 26   --  29  --  29  --  30  --   --   GLUCOSE 108* 120* 203*  < > 104* 205* 118* 132* 62* 202*  --   BUN 8 8 12   < > 12 13 11 9 9 10   --   CREATININE 0.66 0.73 0.78  < > 0.69 0.70 0.71 0.60* 0.61 0.60*  --   CALCIUM 8.0* 7.8* 8.1*  --  8.3*  --  8.0*  --  7.8*  --   --   MG 2.4 2.1 2.0  --  1.8  --  1.7  --  2.0  --  2.1  PHOS 3.5  --  3.9  --   --   --   --   --   --   --   --   < > =  values in this interval not displayed.  Liver Function Tests:  Recent Labs Lab 05/31/16 0228 06/01/16 0500 06/02/16 0453  AST 49* 41 23  ALT 14* 17 15*  ALKPHOS 51 67 64  BILITOT 1.7* 1.9* 1.4*  PROT 5.3* 5.6* 5.7*  ALBUMIN 2.9* 2.7* 2.4*   No results for input(s): LIPASE, AMYLASE in the last 168 hours. No results for input(s): AMMONIA in the last 168 hours.  CBC:  Recent Labs Lab 06/01/16 0500  06/02/16 0453  06/03/16 0411 06/03/16 1739 06/04/16 0446 06/04/16 1613 06/05/16 0624  WBC 10.8*  --  8.7  --  8.1  --  7.5  --  6.2  NEUTROABS 8.9*  --  7.1  --  6.3  --  5.6  --  4.4  HGB 7.9*  < > 8.2*  < > 7.9* 8.5* 7.8* 8.8* 7.8*  HCT 24.1*  < > 25.0*  < > 24.4* 25.0* 24.1* 26.0* 25.0*  MCV 86.4  --  87.4  --  87.1  --  87.0  --  88.0  PLT 123*  --  141*  --  158  --  184  --  203  < > = values in this interval not displayed.  INR:  Recent Labs Lab 06/01/16 0500 06/02/16 0453 06/03/16 0411 06/04/16 0446 06/05/16 0624  INR 1.21 1.22 1.19 1.21 1.53    Other results:   Imaging: Dg Chest Port 1 View  Result Date: 06/05/2016 CLINICAL DATA:  Right pleural effusion. EXAM: PORTABLE CHEST 1 VIEW COMPARISON:  06/03/2016 FINDINGS: The cardio pericardial silhouette is enlarged. Interstitial markings are diffusely coarsened with chronic features. Small pleural effusions, right greater than  left, no substantial change. The LVAD again noted. Left pacer/ AICD a remains in place. Right IJ central line tip overlies the proximal to mid SVC. IMPRESSION: Cardiomegaly with underlying interstitial changes and small bilateral effusions, right greater than left. Electronically Signed   By: Kennith Center M.D.   On: 06/05/2016 07:06     Medications:     Scheduled Medications: . amiodarone  200 mg Oral BID  . aspirin EC  325 mg Oral Daily   Or  . aspirin  324 mg Per Tube Daily   Or  . aspirin  300 mg Rectal Daily  . bisacodyl  10 mg Oral Daily   Or  . bisacodyl  10 mg Rectal Daily  . docusate sodium  200 mg Oral Daily  . ferrous fumarate-b12-vitamic C-folic acid  1 capsule Oral BID BM  . furosemide  80 mg Intravenous Daily  . guaiFENesin  600 mg Oral BID  . insulin aspart  0-24 Units Subcutaneous Q4H  . insulin detemir  18 Units Subcutaneous BID  . losartan  25 mg Oral Daily  . mouth rinse  15 mL Mouth Rinse BID  . metoCLOPramide (REGLAN) injection  10 mg Intravenous Q6H  . pantoprazole  40 mg Oral Daily  . potassium chloride  40 mEq Oral BID  . sildenafil  20 mg Oral TID  . sodium chloride flush  3 mL Intravenous Q12H  . spironolactone  25 mg Oral Daily  . warfarin  7.5 mg Oral q1800  . Warfarin - Pharmacist Dosing Inpatient   Does not apply q1800    Infusions: . sodium chloride    . sodium chloride    . sodium chloride    . heparin 600 Units/hr (06/05/16 0400)  . lactated ringers Stopped (06/01/16 0900)  . lactated ringers 20 mL/hr at 06/05/16  0400    PRN Medications: hydrALAZINE, midazolam, ondansetron (ZOFRAN) IV, oxyCODONE, sodium chloride flush, traMADol   Assessment/Plan/Discussion    1. Acute on chronic systolic CHF: Ischemic cardiomyopathy.  St Jude ICD.  05/26/16 ECHO EF 20-25%.  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 Milrinone stopped 6/3, co-ox 63%.  CVP 13 today with volume overload.  - Lasix 80 mg IV bid.  - Can pull CVL today and likely go to 2W.  - MAP higher today,  start losartan 25 daily and continue spironolactone 25 daily.  May restart Entresto eventually depending on MAP.  - Continue 324 mg aspirin + coumadin. INR 1.5, he is on heparin gtt.  - VAD education being done by nurses at bedside.  3. R Pleural Effusion:  No draining from PleurX catheter. Small effusions on CXR today, may be able to remove.  4. CAD: 3V CAD S/P stent Prox LAD and PTCA to subtotally occluded OMI 01/2016  5. Pulmonary HTN: PA catheter out.  PA pressures were lower post-LVAD.  - Pre-op, he was on Revatio 20 mg tid.  Continue revatio 20 mg tid.  6. Former Smoker.   7. HTN:  MAPs rising again, will add back losartan for now, titrate as needed.  May eventually return to [facility].  8. Anemia: Got 1u PRBCs on 6/1.  No obvious bleeding. Continue protonix. Hgb stable, 7.8 today.  Watch today, transfuse if falls further.  9. DM2: Insulin.  10. Hypokalemia: Needs BMET today.   I reviewed the HVAD parameters from today, and compared the results to the patient's prior recorded data.  No programming changes were made.  The HVAD is functioning within specified parameters.    Length of Stay: 12  Marca Ancona MD 06/05/2016, 8:18 AM  VAD Team --- VAD ISSUES ONLY--- Pager [phone] (7am - 7am)

## 2016-06-05 NOTE — Progress Notes (Signed)
  Paged for Nosebleed  ~ 30 minutes pt had slight nose bleed. Inserted tissue paper into his nose and when he pulled out, a large clot came with it and further bleeding occurred.  Discussed with Dr. Shirlee LatchMcLean.   Hold pressure and hold heparin until bleed stops.    Casimiro NeedleMichael [address]"Andy" [place], PA-C 06/05/2016    ADDENDUM:  No further bleeding after ~15 minutes of holding pressure. Tissue paper removed with only scant bleeding.   Heparin resume just after 1100 am.      Baldwin CrownMichael "Andy" [place], [place]PA-C 06/05/2016 1:49 PM

## 2016-06-05 NOTE — Progress Notes (Signed)
Per Dr. Donata ClayVan Trigt - drain Pleurx catheter Wednesday 06/07/16.  Leonidas Rombergaitlin S Bumbledare, RN

## 2016-06-05 NOTE — Progress Notes (Signed)
ANTICOAGULATION CONSULT NOTE - Follow Up Consult  Pharmacy Consult for Warfarin / Heparin  Indication: LVAD  Allergies  Allergen Reactions  . Metformin And Related Nausea And Vomiting    *Only the extended release* (does this)  . Niacin And Related Other (See Comments)    REACTION IS SIDE EFFECT "SEVERE" FLUSHING    Patient Measurements: Height: 6' (182.9 cm) Weight: 230 lb 2.6 oz (104.4 kg) IBW/kg (Calculated) : 77.6   Vital Signs: Temp: 99 F (37.2 C) (06/04 0758) Temp Source: Oral (06/04 0758) BP: 101/89 (06/04 0900) Pulse Rate: 101 (06/04 0700)  Labs:  Recent Labs  06/03/16 0411  06/04/16 0446 06/04/16 1613 06/04/16 1952 06/05/16 0624  HGB 7.9*  < > 7.8* 8.8*  --  7.8*  HCT 24.4*  < > 24.1* 26.0*  --  25.0*  PLT 158  --  184  --   --  203  LABPROT 15.2  --  15.4*  --   --  18.6*  INR 1.19  --  1.21  --   --  1.53  HEPARINUNFRC  --   --   --   --  <0.10* <0.10*  CREATININE 0.71  < > 0.61 0.60*  --  0.51*  < > = values in this interval not displayed.  Estimated Creatinine Clearance: 121.1 mL/min (A) (by C-G formula based on SCr of 0.51 mg/dL (L)).  Medications: Heparin @ 600 units/hr  Assessment: 60yom with CAD- PCI LAD 1/18 on plavix PTA held for LVAD implant - Heartware 05/30/2016.   Warfarin started 5/30 post op day #1. INR remained low and heparin bridge added yesterday.   INR up to 1.53 today after two 7.5mg  doses. Heparin level is undetectable, but not titrating drip rate until at least post op day #8 and can aim for lower goal 0.3-0.5. Hgb down to 7.8 - received 1 unit PRBC on 6/1. LDH stable. No bleeding.  Goal of Therapy:  INR 2-3 Monitor platelets by anticoagulation protocol: Yes   Plan:  1) Warfarin 7.5mg  again tonight 2) Continue heparin at 600 units/hr - no titration 3) Daily heparin level, INR, CBC, LDH   Louie CasaJennifer Pruitt Taboada, PharmD, BCPS 06/05/2016 9:58 AM

## 2016-06-05 NOTE — Progress Notes (Signed)
[facility]. Pt oriented to room and equipment. VSS. Telemetry applied. Bessie (Heartware Rep) at bedside for transfer. LVAD parameters per orders. Will continue to monitor.  Leonidas Rombergaitlin S Bumbledare, RN

## 2016-06-05 NOTE — Progress Notes (Signed)
CSW met with patient at bedside. Patient reports he had a rough day yesterday but feeling a little better today. Patient reports he walked the unit 2x this morning and hopes to be transferred to 2W this afternoon. CSW provided supportive intervention and will continue to follow throughout hospitalization. Raquel Sarna, [place], [place]

## 2016-06-06 ENCOUNTER — Inpatient Hospital Stay (HOSPITAL_COMMUNITY): Payer: BLUE CROSS/BLUE SHIELD

## 2016-06-06 DIAGNOSIS — Z95811 Presence of heart assist device: Secondary | ICD-10-CM

## 2016-06-06 DIAGNOSIS — I5023 Acute on chronic systolic (congestive) heart failure: Secondary | ICD-10-CM

## 2016-06-06 LAB — BASIC METABOLIC PANEL
ANION GAP: 7 (ref 5–15)
BUN: 10 mg/dL (ref 6–20)
CHLORIDE: 97 mmol/L — AB (ref 101–111)
CO2: 28 mmol/L (ref 22–32)
Calcium: 8.1 mg/dL — ABNORMAL LOW (ref 8.9–10.3)
Creatinine, Ser: 0.69 mg/dL (ref 0.61–1.24)
GFR calc non Af Amer: >60 mL/min (ref >60)
GLUCOSE: 127 mg/dL — AB (ref 65–99)
POTASSIUM: 4.3 mmol/L (ref 3.5–5.1)
Sodium: 132 mmol/L — ABNORMAL LOW (ref 135–145)

## 2016-06-06 LAB — GLUCOSE, CAPILLARY
GLUCOSE-CAPILLARY: 60 mg/dL — AB (ref 65–99)
GLUCOSE-CAPILLARY: 85 mg/dL (ref 65–99)
GLUCOSE-CAPILLARY: 203 mg/dL — AB (ref 65–99)
GLUCOSE-CAPILLARY: 182 mg/dL — AB (ref 65–99)
Glucose-Capillary: 59 mg/dL — ABNORMAL LOW (ref 65–99)
Glucose-Capillary: 179 mg/dL — ABNORMAL HIGH (ref 65–99)

## 2016-06-06 LAB — CBC WITH DIFFERENTIAL/PLATELET
Basophils Absolute: 0 10*3/uL (ref 0.0–0.1)
Basophils Relative: 1 %
Eosinophils Absolute: 0.2 10*3/uL (ref 0.0–0.7)
Eosinophils Relative: 3 %
HCT: 24.4 % — ABNORMAL LOW (ref 39.0–52.0)
Hemoglobin: 7.6 g/dL — ABNORMAL LOW (ref 13.0–17.0)
Lymphocytes Relative: 12 %
Lymphs Abs: 0.9 10*3/uL (ref 0.7–4.0)
MCH: 27.6 pg (ref 26.0–34.0)
MCHC: 31.1 g/dL (ref 30.0–36.0)
MCV: 88.7 fL (ref 78.0–100.0)
Monocytes Absolute: 0.8 10*3/uL (ref 0.1–1.0)
Monocytes Relative: 11 %
Neutro Abs: 5.2 10*3/uL (ref 1.7–7.7)
Neutrophils Relative %: 73 %
Platelets: 193 10*3/uL (ref 150–400)
RBC: 2.75 MIL/uL — ABNORMAL LOW (ref 4.22–5.81)
RDW: 17 % — ABNORMAL HIGH (ref 11.5–15.5)
WBC: 7.1 10*3/uL (ref 4.0–10.5)

## 2016-06-06 LAB — PROTIME-INR
INR: 1.52
Prothrombin Time: 18.5 seconds — ABNORMAL HIGH (ref 11.4–15.2)

## 2016-06-06 LAB — HEPARIN LEVEL (UNFRACTIONATED): Heparin Unfractionated: <0.1 IU/mL — ABNORMAL LOW (ref 0.30–0.70)

## 2016-06-06 LAB — LACTATE DEHYDROGENASE: LDH: 228 U/L — ABNORMAL HIGH (ref 98–192)

## 2016-06-06 LAB — BRAIN NATRIURETIC PEPTIDE: B Natriuretic Peptide: 398.6 pg/mL — ABNORMAL HIGH (ref 0.0–100.0)

## 2016-06-06 LAB — PREPARE RBC (CROSSMATCH)

## 2016-06-06 MED ORDER — FUROSEMIDE 10 MG/ML IJ SOLN
80.0000 mg | Freq: Two times a day (BID) | INTRAMUSCULAR | Status: DC
  Administered 2016-06-06 – 2016-06-11 (×12): 80 mg via INTRAVENOUS
  Filled 2016-06-06 (×12): qty 8

## 2016-06-06 NOTE — Progress Notes (Signed)
HVAD  Rounding Note  Postop day 7  Subjective:   61 year old patient with ischemic cardiomyopathy and acute on chronic systolic heart failure who presented with fatigue and failure to thrive despite home IV milrinone and maximal medical therapy including daily drainage of pleural effusion with right Pleurx catheter  The patient was assessed and found to be candidate for destination therapy LVAD implantation. He was optimized with diuresis and Plavix washout prior to surgical implantation  Patient feels more fatigued today with less exercise tolerance and hemoglobin is now down to 7.4 The patient has been on IV heparin since the weekend as a bridge to Coumadin therapeutic range Remains on iv heparin until INR > 1.8  2 view chest x-ray taken today shows minimal interstitial edema, small right pleural effusion. Pleurx catheter will be drained tomorrow to determine if the patient still needs the catheter when he goes home.  Postoperative coagulopathy  related to preoperative Plavix treated with platelet transfusion .  Expected postop blood loss anemia. Received 2 u PRBC postop for Hb <7.5 Coumadin per PharmD.    BP goal map < 85-90  Good pulsatility on VAD waveforms  LVAD INTERROGATION:  VAD:  Flow 4.89ters/min, speed 2800 rpm , power 5.1,   Controller intact  Objective:    Vital Signs:   Temp:  [98.3 F (36.8 C)-98.7 F (37.1 C)] 98.3 F (36.8 C) (06/05 0419) Pulse Rate:  [86-93] 86 (06/05 0419) Resp:  [16-18] 18 (06/05 0419) SpO2:  [97 %-99 %] 97 % (06/05 0419) Weight:  [227 lb (103 kg)] 227 lb (103 kg) (06/05 0429) Last BM Date: 06/05/16 Mean arterial Pressure 88 mmHg  Intake/Output:   Intake/Output Summary (Last 24 hours) at 06/06/16 1335 Last data filed at 06/06/16 1131  Gross per 24 hour  Intake              188 ml  Output             1675 ml  Net            -1487 ml     Physical Exam: General:  Well appearing. No resp difficulty OOB in chair HEENT:  normal Neck: supple. JVP . Carotids 2+ bilat; no bruits. No lymphadenopathy or thryomegaly appreciated. Cor: Mechanical heart sounds with LVAD hum present. Lungs: clear Abdomen: soft, nontender, nondistended. No hepatosplenomegaly. No bruits or masses. Good bowel sounds. Extremities: no cyanosis, clubbing, rash, edema Neuro: alert & orientedx3, cranial nerves grossly intact. moves all 4 extremities w/o difficulty. Affect pleasant  Telemetry:paced 90  Labs: Basic Metabolic Panel:  Recent Labs Lab 05/31/16 0228  06/01/16 0500  06/02/16 0453  06/03/16 0411  06/03/16 1739 06/04/16 0446 06/04/16 1613 06/05/16 0624 06/06/16 0016  NA 135  < > 130*  < > 135  < > 133*  < > 134* 134* 134* 133* 132*  K 4.1  < > 3.9  < > 3.2*  < > 2.7*  < > 3.4* 3.1* 4.1 4.1 4.3  CL 101  < > 96*  < > 98*  < > 96*  < > 91* 94* 93* 99* 97*  CO2 27  < > 26  --  29  --  29  --   --  30  --  29 28  GLUCOSE 108*  < > 203*  < > 104*  < > 118*  < > 132* 62* 202* 60* 127*  BUN 8  < > 12  < > 12  < > 11  < >  9 9 10 9 10   CREATININE 0.66  < > 0.78  < > 0.69  < > 0.71  < > 0.60* 0.61 0.60* 0.51* 0.69  CALCIUM 8.0*  < > 8.1*  --  8.3*  --  8.0*  --   --  7.8*  --  8.0* 8.1*  MG 2.4  < > 2.0  --  1.8  --  1.7  --   --  2.0  --  2.1  --   PHOS 3.5  --  3.9  --   --   --   --   --   --   --   --   --   --   < > = values in this interval not displayed.  Liver Function Tests:  Recent Labs Lab 05/31/16 0228 06/01/16 0500 06/02/16 0453  AST 49* 41 23  ALT 14* 17 15*  ALKPHOS 51 67 64  BILITOT 1.7* 1.9* 1.4*  PROT 5.3* 5.6* 5.7*  ALBUMIN 2.9* 2.7* 2.4*   No results for input(s): LIPASE, AMYLASE in the last 168 hours. No results for input(s): AMMONIA in the last 168 hours.  CBC:  Recent Labs Lab 06/02/16 0453  06/03/16 0411  06/03/16 1739 06/04/16 0446 06/04/16 1613 06/05/16 0624 06/06/16 0016  WBC 8.7  --  8.1  --   --  7.5  --  6.2 7.1  NEUTROABS 7.1  --  6.3  --   --  5.6  --  4.4 5.2  HGB 8.2*  <  > 7.9*  < > 8.5* 7.8* 8.8* 7.8* 7.6*  HCT 25.0*  < > 24.4*  < > 25.0* 24.1* 26.0* 25.0* 24.4*  MCV 87.4  --  87.1  --   --  87.0  --  88.0 88.7  PLT 141*  --  158  --   --  184  --  203 193  < > = values in this interval not displayed.  INR:  Recent Labs Lab 06/02/16 0453 06/03/16 0411 06/04/16 0446 06/05/16 0624 06/06/16 0016  INR 1.22 1.19 1.21 1.53 1.52    Other results:  EKG:   Imaging: Dg Chest 2 View  Result Date: 06/06/2016 CLINICAL DATA:  Left ventricular assist device . EXAM: CHEST  2 VIEW COMPARISON:  06/05/2016 . FINDINGS: Left ventricular assist device stable position. Cardiac pacer stable position. Prior median sternotomy. Stable cardiomegaly with diffuse bilateral pulmonary interstitial prominence and small bilateral pleural effusions consistent CHF. No change . No pneumothorax. IMPRESSION: 1. Left ventricular assist device and cardiac pacer in stable position. 2. Prior the sternotomy. Stable cardiomegaly with diffuse bilateral pulmonary interstitial prominence and small bilateral pleural effusions consistent CHF. No interim change . Electronically Signed   By: Maisie Fus  Register   On: 06/06/2016 13:12   Dg Chest Port 1 View  Result Date: 06/05/2016 CLINICAL DATA:  Right pleural effusion. EXAM: PORTABLE CHEST 1 VIEW COMPARISON:  06/03/2016 FINDINGS: The cardio pericardial silhouette is enlarged. Interstitial markings are diffusely coarsened with chronic features. Small pleural effusions, right greater than left, no substantial change. The LVAD again noted. Left pacer/ AICD a remains in place. Right IJ central line tip overlies the proximal to mid SVC. IMPRESSION: Cardiomegaly with underlying interstitial changes and small bilateral effusions, right greater than left. Electronically Signed   By: Kennith Center M.D.   On: 06/05/2016 07:06     Medications:     Scheduled Medications: . amiodarone  200 mg Oral BID  . aspirin EC  325 mg Oral Daily   Or  . aspirin  324 mg  Per Tube Daily   Or  . aspirin  300 mg Rectal Daily  . bisacodyl  10 mg Oral Daily   Or  . bisacodyl  10 mg Rectal Daily  . docusate sodium  200 mg Oral Daily  . ferrous fumarate-b12-vitamic C-folic acid  1 capsule Oral BID BM  . furosemide  80 mg Intravenous BID  . guaiFENesin  600 mg Oral BID  . insulin aspart  0-15 Units Subcutaneous TID WC  . insulin aspart  0-5 Units Subcutaneous QHS  . insulin detemir  18 Units Subcutaneous BID  . losartan  25 mg Oral BID  . mouth rinse  15 mL Mouth Rinse BID  . pantoprazole  40 mg Oral Daily  . potassium chloride  40 mEq Oral BID  . sildenafil  20 mg Oral TID  . sodium chloride flush  3 mL Intravenous Q12H  . spironolactone  25 mg Oral Daily  . warfarin  7.5 mg Oral q1800  . Warfarin - Pharmacist Dosing Inpatient   Does not apply q1800    Infusions: . sodium chloride    . sodium chloride    . sodium chloride 10 mL/hr at 06/05/16 1245  . heparin 600 Units/hr (06/06/16 0644)    PRN Medications: hydrALAZINE, ondansetron (ZOFRAN) IV, oxyCODONE, sodium chloride flush, traMADol   Assessment:  Acute on chronic systolic heart failure from ischemic cardiomyopathy Previous placement of AICD pacemaker system Preoperative pulmonary  hypertension treated with sildenafil  Recent smoker history Expected postop blood loss anemia  Plan/Discussion:    Patient only walk 300 feet today, fatigue probably related to his progressive anemia We'll transfuse one unit of packed cells Hope to stop IV heparin infusion tomorrow Drain Pleurx catheter tomorrow-it appears on chest x-ray today there is a small right pleural effusion Incisions all clean and dry Cont patient /family education I reviewed the LVAD parameters from today, and compared the results to the patient's prior recorded data.  No programming changes were made.  The LVAD is functioning within specified parameters.  The patient performs LVAD self-test daily.  LVAD interrogation was negative  for any significant power changes, alarms or PI events/speed drops.  LVAD equipment check completed and is in good working order.  Back-up equipment present.   LVAD education done on emergency procedures and precautions and reviewed exit site care.  Length of Stay: [address]13  Kathlee Nationseter Van Northforkrigt III 06/06/2016, 1:35 PM

## 2016-06-06 NOTE — Progress Notes (Signed)
Inpatient Diabetes Program Recommendations  AACE/ADA: New Consensus Statement on Inpatient Glycemic Control (2015)  Target Ranges:  Prepandial:   less than 140 mg/dL      Peak postprandial:   less than 180 mg/dL (1-2 hours)      Critically ill patients:  140 - 180 mg/dL   Lab Results  Component Value Date   GLUCAP 85 06/06/2016   HGBA1C 7.6 (H) 05/25/2016    Review of Glycemic Control Results for [name], [name] (MRN [id number]) as of 06/06/2016 09:34  Ref. Range 06/05/2016 17:03 06/05/2016 20:41 06/06/2016 06:18 06/06/2016 06:36 06/06/2016 07:04  Glucose-Capillary Latest Ref Range: 65 - 99 mg/dL 409183 (H) 811171 (H) 59 (L) 60 (L) 85    Inpatient Diabetes Program Recommendations:  Noted fasting CBG 59 today. Please consider: -Decrease Levemir to 15 units bid  Thank you, Dalton FischerJudy E. Geordan Xu, RN, MSN, CDE  Diabetes Coordinator Inpatient Glycemic Control Team Team Pager [phone] (8am-5pm) 06/06/2016 9:37 AM

## 2016-06-06 NOTE — Progress Notes (Signed)
CARDIAC REHAB PHASE I   PRE:  Rate/Rhythm: 93 SR    BP: sitting 80 MAP    SaO2: 96 RA  MODE:  Ambulation: 300 ft   POST:  Rate/Rhythm: 120 ST    BP: sitting 90 MAP    SaO2: 97 RA   Pt not feeling well today, feels more tired, less energy. He was willing to try to walk. Needed assist to stand. Pushed IV pole with slow steady pace and gait. I used gait belt but no loss of balance. Pt doing very well with equipment and able to demonstrate to me. To EOB to eat lunch.  [phone]  Harriet MassonRandi Kristan Rayaan Lorah CES, ACSM 06/06/2016 11:52 AM

## 2016-06-06 NOTE — Progress Notes (Signed)
LVAD Inpatient Rounding Note:  Heartware LVAD implanted on 5/29/2018by Tharon Aquas Trigt.  Vital signs: HR: 89 Doppler Pressure: 74 Automatic BP: 105/90 (97) O2 Sat: 97 on RA Wt in lbs: 200 > 216> 230>227   LVAD interrogation reveals:  Speed: 2800 Flow: 5.7 Power: 4.8 Peak /Trough: 7.3/4.1 Alarms:none   Suction alarm ON  Alarm settings: Low flow: 3.0 High Power: 7.0   Drive Line: Daily using daily dressing kit with aquacel silver strips on exit site  Gtts:  none  Blood products: 1 PC and 4 Plasma - in OR 05/30/16  ICD: St Jude single lead Therapy ON  Labs:  LDH trend: 234>237>225>231>228  INR trend: 1.36 > 1.19>1.19>1.21>1.53>1.52  Anticoagulation Plan: -INR Goal: 2-2.5 -ASA Dose: 325 (started 05/31/16)  Adverse Events on VAD: -none  Plan/Recommendations:   1. Daily dressing changes. 2. If intermittent beeping with "connect power" re-occurs, do not remove either power source. Assure both power sources (either batteries x 2 or battery/AC power) are connected correctly. Page VAD pager if any issues.  3. Possible removal of Pleurx catheter later this week.  Balinda Quails RN, VAD Coordinator 24/7 pager [phone]

## 2016-06-06 NOTE — Progress Notes (Signed)
ANTICOAGULATION CONSULT NOTE - Follow Up Consult  Pharmacy Consult for Warfarin / Heparin  Indication: LVAD  Allergies  Allergen Reactions  . Metformin And Related Nausea And Vomiting    *Only the extended release* (does this)  . Niacin And Related Other (See Comments)    REACTION IS SIDE EFFECT "SEVERE" FLUSHING    Patient Measurements: Height: 6' (182.9 cm) Weight: 227 lb (103 kg) IBW/kg (Calculated) : 77.6   Vital Signs: Temp: 98.3 F (36.8 C) (06/05 0419) Temp Source: Oral (06/05 0419) Pulse Rate: 86 (06/05 0419)  Labs:  Recent Labs  06/04/16 0446 06/04/16 1613 06/04/16 1952 06/05/16 0624 06/06/16 0016  HGB 7.8* 8.8*  --  7.8* 7.6*  HCT 24.1* 26.0*  --  25.0* 24.4*  PLT 184  --   --  203 193  LABPROT 15.4*  --   --  18.6* 18.5*  INR 1.21  --   --  1.53 1.52  HEPARINUNFRC  --   --  <0.10* <0.10* <0.10*  CREATININE 0.61 0.60*  --  0.51* 0.69    Estimated Creatinine Clearance: 120.4 mL/min (by C-G formula based on SCr of 0.69 mg/dL).  Medications: Heparin @ 600 units/hr  Assessment: 60yom with CAD- PCI LAD 1/18 on plavix PTA held for LVAD implant - Heartware 05/30/2016.   Warfarin started 5/30 post op day #1. INR remained low and heparin bridge added 6/3.   INR up to 1.52 today after three 7.5mg  doses. Heparin level is undetectable, but not titrating drip rate until at least post op day #8 and can aim for lower goal 0.3-0.5. Hgb down to 7.6 - received 1 unit PRBC on 6/1. LDH stable. He had some nosebleeds yesterday but have now resolved.  Goal of Therapy:  INR 2-3 Monitor platelets by anticoagulation protocol: Yes   Plan:  1) Warfarin 7.5mg  again tonight 2) Continue heparin at 600 units/hr - no titration 3) Daily heparin level, INR, CBC, LDH   Louie CasaJennifer Shacoria Latif, PharmD, BCPS 06/06/2016 9:14 AM

## 2016-06-06 NOTE — Progress Notes (Signed)
CSW met with patient at bedside. Patient reports that he is feeling "awkward" today due to drop in sugar this morning. Patient despite not feeling well remains positive in his recovery stating "I know someday's I will take 2 steps forward and others maybe 1 step back". Patient reports his father will visit this afternoon and will complete the dressing change. "He is doing well and no problems there". Patient appears to be coping well with a challenging recovery process. CSW will continue to follow for support and any other needs as they arise. Raquel Sarna, Vivian, [place]

## 2016-06-06 NOTE — Progress Notes (Signed)
Patient ID: [name], male   DOB: 1955-11-13, 61 y.o.   MRN: [id number]    HVAD  Rounding Note  Subjective:    Post Op Day 6 HVAD   He is now off milrinone with good co-ox.  Entresto stopped 6/3 with fall in MAP, now back on losartan 25 mg bid.  MAP 70s today.   He diuresed yesterday, weight down 3 lbs.    Not short of breath, feels like legs are heavy.   Hgb 7.5>8.2>7.9>7.8>7.8>7.6 LDH 225>231>228  LVAD INTERROGATION:  HVAD:  Flow  5.7 liters/min, RPM 2800  Power 4.8  Peak 7.5, trough 4.0   Objective:    Vital Signs:   Temp:  [98.3 F (36.8 C)-98.7 F (37.1 C)] 98.3 F (36.8 C) (06/05 0419) Pulse Rate:  [86-93] 86 (06/05 0419) Resp:  [16-18] 18 (06/05 0419) BP: (101-106)/(89-93) 105/90 (06/04 1100) SpO2:  [97 %-99 %] 97 % (06/05 0419) Weight:  [227 lb (103 kg)] 227 lb (103 kg) (06/05 0429) Last BM Date: 06/05/16 Mean arterial Pressure 70s  Intake/Output:   Intake/Output Summary (Last 24 hours) at 06/06/16 0838 Last data filed at 06/06/16 0429  Gross per 24 hour  Intake            208.1 ml  Output             2040 ml  Net          -1831.9 ml     Physical Exam: GENERAL: NAD HEENT: normal   NECK: JVP 9-10 cm CARDIAC:  Mechanical heart sounds with LVAD hum present. Normal sound. Sternotomy looks good.  LUNGS:  Mildly decreased breath sounds right base.  ABDOMEN:  Soft NT/ND, good BS LVAD exit site:  Dressing dry and intact. No signs infection EXTREMITIES:  Warm and dry, no cyanosis, clubbing, rash. 1+ ankle edema.   NEUROLOGIC:  Alert and oriented x 4.  Moves all 4 without difficulty  Telemetry: Sinus 90s. Personally reviewed  .    Labs: Basic Metabolic Panel:  Recent Labs Lab 05/31/16 0228  06/01/16 0500  06/02/16 0453  06/03/16 0411  06/03/16 1739 06/04/16 0446 06/04/16 1613 06/05/16 0624 06/06/16 0016  NA 135  < > 130*  < > 135  < > 133*  < > 134* 134* 134* 133* 132*  K 4.1  < > 3.9  < > 3.2*  < > 2.7*  < > 3.4* 3.1* 4.1 4.1 4.3  CL 101   < > 96*  < > 98*  < > 96*  < > 91* 94* 93* 99* 97*  CO2 27  < > 26  --  29  --  29  --   --  30  --  29 28  GLUCOSE 108*  < > 203*  < > 104*  < > 118*  < > 132* 62* 202* 60* 127*  BUN 8  < > 12  < > 12  < > 11  < > 9 9 10 9 10   CREATININE 0.66  < > 0.78  < > 0.69  < > 0.71  < > 0.60* 0.61 0.60* 0.51* 0.69  CALCIUM 8.0*  < > 8.1*  --  8.3*  --  8.0*  --   --  7.8*  --  8.0* 8.1*  MG 2.4  < > 2.0  --  1.8  --  1.7  --   --  2.0  --  2.1  --   PHOS 3.5  --  3.9  --   --   --   --   --   --   --   --   --   --   < > = values in this interval not displayed.  Liver Function Tests:  Recent Labs Lab 05/31/16 0228 06/01/16 0500 06/02/16 0453  AST 49* 41 23  ALT 14* 17 15*  ALKPHOS 51 67 64  BILITOT 1.7* 1.9* 1.4*  PROT 5.3* 5.6* 5.7*  ALBUMIN 2.9* 2.7* 2.4*   No results for input(s): LIPASE, AMYLASE in the last 168 hours. No results for input(s): AMMONIA in the last 168 hours.  CBC:  Recent Labs Lab 06/02/16 0453  06/03/16 0411  06/03/16 1739 06/04/16 0446 06/04/16 1613 06/05/16 0624 06/06/16 0016  WBC 8.7  --  8.1  --   --  7.5  --  6.2 7.1  NEUTROABS 7.1  --  6.3  --   --  5.6  --  4.4 5.2  HGB 8.2*  < > 7.9*  < > 8.5* 7.8* 8.8* 7.8* 7.6*  HCT 25.0*  < > 24.4*  < > 25.0* 24.1* 26.0* 25.0* 24.4*  MCV 87.4  --  87.1  --   --  87.0  --  88.0 88.7  PLT 141*  --  158  --   --  184  --  203 193  < > = values in this interval not displayed.  INR:  Recent Labs Lab 06/02/16 0453 06/03/16 0411 06/04/16 0446 06/05/16 0624 06/06/16 0016  INR 1.22 1.19 1.21 1.53 1.52    Other results:   Imaging: Dg Chest Port 1 View  Result Date: 06/05/2016 CLINICAL DATA:  Right pleural effusion. EXAM: PORTABLE CHEST 1 VIEW COMPARISON:  06/03/2016 FINDINGS: The cardio pericardial silhouette is enlarged. Interstitial markings are diffusely coarsened with chronic features. Small pleural effusions, right greater than left, no substantial change. The LVAD again noted. Left pacer/ AICD a remains  in place. Right IJ central line tip overlies the proximal to mid SVC. IMPRESSION: Cardiomegaly with underlying interstitial changes and small bilateral effusions, right greater than left. Electronically Signed   By: Kennith CenterEric  Mansell M.D.   On: 06/05/2016 07:06     Medications:     Scheduled Medications: . amiodarone  200 mg Oral BID  . aspirin EC  325 mg Oral Daily   Or  . aspirin  324 mg Per Tube Daily   Or  . aspirin  300 mg Rectal Daily  . bisacodyl  10 mg Oral Daily   Or  . bisacodyl  10 mg Rectal Daily  . docusate sodium  200 mg Oral Daily  . ferrous fumarate-b12-vitamic C-folic acid  1 capsule Oral BID BM  . furosemide  80 mg Intravenous Daily  . guaiFENesin  600 mg Oral BID  . insulin aspart  0-15 Units Subcutaneous TID WC  . insulin aspart  0-5 Units Subcutaneous QHS  . insulin detemir  18 Units Subcutaneous BID  . losartan  25 mg Oral BID  . mouth rinse  15 mL Mouth Rinse BID  . pantoprazole  40 mg Oral Daily  . potassium chloride  40 mEq Oral BID  . sildenafil  20 mg Oral TID  . sodium chloride flush  3 mL Intravenous Q12H  . spironolactone  25 mg Oral Daily  . warfarin  7.5 mg Oral q1800  . Warfarin - Pharmacist Dosing Inpatient   Does not apply q1800    Infusions: . sodium chloride    .  sodium chloride    . sodium chloride 10 mL/hr at 06/05/16 1245  . heparin 600 Units/hr (06/06/16 0644)    PRN Medications: hydrALAZINE, midazolam, ondansetron (ZOFRAN) IV, oxyCODONE, sodium chloride flush, traMADol   Assessment/Plan/Discussion    1. Acute on chronic systolic CHF: Ischemic cardiomyopathy.  St Jude ICD.  05/26/16 ECHO EF 20-25%.  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 Milrinone stopped 6/3, co-ox 63% afterwards.  Still volume overloaded on exam but weight now coming down with diuresis.   - Continue Lasix 80 mg IV bid.  - MAP controlled now on losartan 25 mg bid and spironolactone 25 daily. - Continue 324 mg aspirin + coumadin. INR 1.5, he is on heparin gtt to continue until INR >  1.8.   - VAD education being done by rsnues at bedside.  3. R Pleural Effusion:  Plan to remove PleurX when he is off heparin gtt.  4. CAD: 3V CAD S/P stent Prox LAD and PTCA to subtotally occluded OMI 01/2016  5. Pulmonary HTN: PA catheter out.  PA pressures were lower post-LVAD.  - Pre-op, he was on Revatio 20 mg tid.  Continue revatio 20 mg tid.  6. Former Smoker.   7. HTN:  MAP 70s on losartan and spironolactone.  8. Anemia: Got 1u PRBCs on 6/1.  No obvious bleeding. Continue protonix. Hgb mildly lower at 7.6.  Watch today, transfuse if falls further.  9. DM2: Insulin.   I reviewed the HVAD parameters from today, and compared the results to the patient's prior recorded data.  No programming changes were made.  The HVAD is functioning within specified parameters.    Length of Stay: 13  Marca Ancona MD 06/06/2016, 8:38 AM  VAD Team --- VAD ISSUES ONLY--- Pager [phone] (7am - 7am)

## 2016-06-06 NOTE — Progress Notes (Signed)
Pt's nose bleed has completely stopped.  [name], Hila Bolding E, RN

## 2016-06-06 NOTE — Progress Notes (Signed)
Around 04:45, pt started a nose bleed in the right nares. The blood was only a small amout and not gushing.  Pharmacy was informed. A wad of tissue was plugged into the right nares and a small bag of ice placed over the nose.  After about 20 minutes the nose was no longer actively bleeding. Pt continues to plug and lay ice over his nose for a little while longer to make sure bleeding has stopped.  Will continue to monitor.  Harriet Massonavidson, Purcell Jungbluth E, RN

## 2016-06-07 ENCOUNTER — Encounter (HOSPITAL_COMMUNITY): Payer: BLUE CROSS/BLUE SHIELD

## 2016-06-07 LAB — GLUCOSE, CAPILLARY
GLUCOSE-CAPILLARY: 167 mg/dL — AB (ref 65–99)
GLUCOSE-CAPILLARY: 125 mg/dL — AB (ref 65–99)
Glucose-Capillary: 73 mg/dL (ref 65–99)
Glucose-Capillary: 182 mg/dL — ABNORMAL HIGH (ref 65–99)

## 2016-06-07 LAB — BASIC METABOLIC PANEL
Anion gap: 8 (ref 5–15)
BUN: 8 mg/dL (ref 6–20)
CO2: 26 mmol/L (ref 22–32)
Calcium: 8.1 mg/dL — ABNORMAL LOW (ref 8.9–10.3)
Chloride: 97 mmol/L — ABNORMAL LOW (ref 101–111)
Creatinine, Ser: 0.63 mg/dL (ref 0.61–1.24)
GFR calc Af Amer: >60 mL/min (ref >60)
GFR calc non Af Amer: >60 mL/min (ref >60)
Glucose, Bld: 132 mg/dL — ABNORMAL HIGH (ref 65–99)
Potassium: 4.1 mmol/L (ref 3.5–5.1)
Sodium: 131 mmol/L — ABNORMAL LOW (ref 135–145)

## 2016-06-07 LAB — TYPE AND SCREEN
ABO/RH(D): A POS
ANTIBODY SCREEN: NEGATIVE
UNIT DIVISION: 0

## 2016-06-07 LAB — HEPARIN LEVEL (UNFRACTIONATED): Heparin Unfractionated: <0.1 IU/mL — ABNORMAL LOW (ref 0.30–0.70)

## 2016-06-07 LAB — BPAM RBC
Blood Product Expiration Date: 201806132359
ISSUE DATE / TIME: 201806051450
Unit Type and Rh: 6200

## 2016-06-07 LAB — CBC WITH DIFFERENTIAL/PLATELET
Basophils Absolute: 0 10*3/uL (ref 0.0–0.1)
Basophils Relative: 0 %
Eosinophils Absolute: 0.3 10*3/uL (ref 0.0–0.7)
Eosinophils Relative: 3 %
HCT: 26.9 % — ABNORMAL LOW (ref 39.0–52.0)
Hemoglobin: 8.5 g/dL — ABNORMAL LOW (ref 13.0–17.0)
Lymphocytes Relative: 9 %
Lymphs Abs: 0.8 10*3/uL (ref 0.7–4.0)
MCH: 27.3 pg (ref 26.0–34.0)
MCHC: 31.6 g/dL (ref 30.0–36.0)
MCV: 86.5 fL (ref 78.0–100.0)
Monocytes Absolute: 0.9 10*3/uL (ref 0.1–1.0)
Monocytes Relative: 10 %
Neutro Abs: 6.7 10*3/uL (ref 1.7–7.7)
Neutrophils Relative %: 78 %
Platelets: 213 10*3/uL (ref 150–400)
RBC: 3.11 MIL/uL — ABNORMAL LOW (ref 4.22–5.81)
RDW: 17.6 % — ABNORMAL HIGH (ref 11.5–15.5)
WBC: 8.7 10*3/uL (ref 4.0–10.5)

## 2016-06-07 LAB — PROTIME-INR
INR: 1.6
Prothrombin Time: 19.2 seconds — ABNORMAL HIGH (ref 11.4–15.2)

## 2016-06-07 LAB — LACTATE DEHYDROGENASE: LDH: 242 U/L — ABNORMAL HIGH (ref 98–192)

## 2016-06-07 MED ORDER — OXYMETAZOLINE HCL 0.05 % NA SOLN
1.0000 | Freq: Two times a day (BID) | NASAL | Status: DC
  Administered 2016-06-08 – 2016-06-12 (×3): 1 via NASAL
  Filled 2016-06-07: qty 15

## 2016-06-07 MED ORDER — INSULIN DETEMIR 100 UNIT/ML ~~LOC~~ SOLN
14.0000 [IU] | Freq: Every day | SUBCUTANEOUS | Status: DC
  Administered 2016-06-07: 14 [IU] via SUBCUTANEOUS
  Filled 2016-06-07: qty 0.14

## 2016-06-07 MED ORDER — INSULIN DETEMIR 100 UNIT/ML ~~LOC~~ SOLN
18.0000 [IU] | Freq: Every day | SUBCUTANEOUS | Status: DC
  Administered 2016-06-07 – 2016-06-12 (×6): 18 [IU] via SUBCUTANEOUS
  Filled 2016-06-07 (×7): qty 0.18

## 2016-06-07 MED ORDER — METOLAZONE 2.5 MG PO TABS
2.5000 mg | ORAL_TABLET | Freq: Once | ORAL | Status: AC
  Administered 2016-06-07: 2.5 mg via ORAL
  Filled 2016-06-07: qty 1

## 2016-06-07 NOTE — Progress Notes (Signed)
ANTICOAGULATION CONSULT NOTE - Follow Up Consult  Pharmacy Consult for Warfarin / Heparin  Indication: LVAD  Allergies  Allergen Reactions  . Metformin And Related Nausea And Vomiting    *Only the extended release* (does this)  . Niacin And Related Other (See Comments)    REACTION IS SIDE EFFECT "SEVERE" FLUSHING    Patient Measurements: Height: 6' (182.9 cm) Weight: 227 lb 8 oz (103.2 kg) IBW/kg (Calculated) : 77.6   Vital Signs: Temp: 97.6 F (36.4 C) (06/06 0524) Temp Source: Oral (06/06 0524) Pulse Rate: 80 (06/06 0524)  Labs:  Recent Labs  06/05/16 0624 06/06/16 0016 06/07/16 0238  HGB 7.8* 7.6* 8.5*  HCT 25.0* 24.4* 26.9*  PLT 203 193 213  LABPROT 18.6* 18.5* 19.2*  INR 1.53 1.52 1.60  HEPARINUNFRC <0.10* <0.10* <0.10*  CREATININE 0.51* 0.69 0.63    Estimated Creatinine Clearance: 120.4 mL/min (by C-G formula based on SCr of 0.63 mg/dL).  Medications: Heparin @ 600 units/hr  Assessment: 60yom with CAD- PCI LAD 1/18 on plavix PTA held for LVAD implant - Heartware 05/30/2016.   Warfarin started 5/30 post op day #1. INR remained low and heparin bridge added 6/3.   INR up to 1.6 today after four 7.5mg  doses. Heparin level is undetectable, but not titrating drip rate until at least post op day #8 and can aim for lower goal 0.3-0.5. Today is post op day #8, Dr. Maren BeachVanTrigt has not rounded on the patient yet. With frequent nosebleeds not sure if he'll want to increase the heparin rate.  Hgb up to 8.5 - received 1 unit PRBC on 6/1 and again yesterday. LDH stable.   Goal of Therapy:  INR 2-3 Monitor platelets by anticoagulation protocol: Yes   Plan:  1) Warfarin 7.5mg  again tonight 2) Continue heparin at 600 units/hr - Dr. Maren BeachVanTrigt to decided on up titration today 3) Daily heparin level, INR, CBC, LDH   Louie CasaJennifer Yurianna Tusing, PharmD, BCPS 06/07/2016 2:34 PM

## 2016-06-07 NOTE — Progress Notes (Signed)
Notified by telemetry of 11 beat run of v-tach. Pt asymptomatic, denies needs. Paged Dr. Donata ClayVan Trigt. Given verbal order to give scheduled 40 mEq of potassium early.   Leonidas Rombergaitlin S Bumbledare, RN

## 2016-06-07 NOTE — Progress Notes (Addendum)
    VAD Teaching Note:   Patient along with his dad (primary caregiver) has received VAD teaching and education and verbalize the understanding of the HVAD system operation. Peyton NajjarLarry and his dad have both taken the education verification test and passed. Discharge date is set for Monday possibly.  The patient's dad has been trained on the driveline management sterile dressing change and have performed the dressing under my supervision. I have reviewed the daily flow sheet that the patient will record his VAD parameters along with his weights and driveline dressing status with patient and family.     Patient has had extensive education about:  1) Who to call for a VAD emergency.  2) What is considered a VAD emergency.  3) What back-up equipment needs to be with him at  all times.  4) How to take care of his driveline and equipment.  5) How to change his system controller.  6) The restrictions he has with having the pump.  7) How to change power sources.  8) What the critical and advisory alarms are for his VAD.  system.  9) The importance of Coumadin and following up for clinic appointments.  He reports he feels comfortable with his VAD system.  Discharge information materials given to patient include the VAD Booklet.  All questions have been answered.       Valora PiccoloWilson, Lesley Kathleen, RN  VAD Coordinator [phone]

## 2016-06-07 NOTE — Progress Notes (Signed)
1415 Coming to see pt to offer walk. Pt receiving education . Will continue to follow. Luetta Nuttingharlene Gerasimos Plotts RN BSN 06/07/2016 2:14 PM

## 2016-06-07 NOTE — Progress Notes (Signed)
HVAD  Rounding Note  Postop day 8  Subjective:   61 year old patient with ischemic cardiomyopathy and acute on chronic systolic heart failure who presented with fatigue and failure to thrive despite home IV milrinone and maximal medical therapy including daily drainage of pleural effusion with right Pleurx catheter  The patient was assessed and found to be candidate for destination therapy LVAD implantation. He was optimized with diuresis and Plavix washout prior to surgical implantation  Patient Feels better after transfusion 1 unit of packed cells for expected postoperative blood loss anemia. He had 2 good walks. Because his INR remains below 2.0 a heparin bridge is continued  Attempt at drainage of right Pleurx catheter removed only 10-15 cc. We'll plan on removing Pleurx catheter after heparin drip is off prior to discharge  2 view chest x-ray shows minimal interstitial edema, small right pleural effusion.  Postoperative coagulopathy  related to preoperative Plavix treated with platelet transfusion .  Expected postop blood loss anemia. Hemoglobin up to 8.5 after transfusion Coumadin per PharmD.  Plan VAD ramp echo study tomorrow  BP goal map < 85-90  Good pulsatility on VAD waveforms  LVAD INTERROGATION:  VAD:  Flow 4.89ters/min, speed 2800 rpm , power 5.1,   Controller intact  Objective:    Vital Signs:   Temp:  [97.6 F (36.4 C)-98.9 F (37.2 C)] 98.9 F (37.2 C) (06/06 1600) Pulse Rate:  [80-88] 82 (06/06 1600) Resp:  [18-20] 18 (06/06 1600) SpO2:  [96 %-100 %] 99 % (06/06 1600) Weight:  [227 lb 8 oz (103.2 kg)] 227 lb 8 oz (103.2 kg) (06/06 0524) Last BM Date: 06/06/16 Mean arterial Pressure 88 mmHg  Intake/Output:   Intake/Output Summary (Last 24 hours) at 06/07/16 1851 Last data filed at 06/07/16 1744  Gross per 24 hour  Intake            164.4 ml  Output             2000 ml  Net          -1835.6 ml     Physical Exam: General:  Well appearing. No resp  difficulty OOB in chair HEENT: normal Neck: supple. JVP . Carotids 2+ bilat; no bruits. No lymphadenopathy or thryomegaly appreciated. Cor: Mechanical heart sounds with LVAD hum present. Lungs: clear Abdomen: soft, nontender, nondistended. No hepatosplenomegaly. No bruits or masses. Good bowel sounds. Extremities: no cyanosis, clubbing, rash, edema Neuro: alert & orientedx3, cranial nerves grossly intact. moves all 4 extremities w/o difficulty. Affect pleasant  Telemetry:paced 90  Labs: Basic Metabolic Panel:  Recent Labs Lab 06/01/16 0500  06/02/16 0453  06/03/16 0411  06/04/16 0446 06/04/16 1613 06/05/16 0624 06/06/16 0016 06/07/16 0238  NA 130*  < > 135  < > 133*  < > 134* 134* 133* 132* 131*  K 3.9  < > 3.2*  < > 2.7*  < > 3.1* 4.1 4.1 4.3 4.1  CL 96*  < > 98*  < > 96*  < > 94* 93* 99* 97* 97*  CO2 26  --  29  --  29  --  30  --  29 28 26   GLUCOSE 203*  < > 104*  < > 118*  < > 62* 202* 60* 127* 132*  BUN 12  < > 12  < > 11  < > 9 10 9 10 8   CREATININE 0.78  < > 0.69  < > 0.71  < > 0.61 0.60* 0.51* 0.69 0.63  CALCIUM 8.1*  --  8.3*  --  8.0*  --  7.8*  --  8.0* 8.1* 8.1*  MG 2.0  --  1.8  --  1.7  --  2.0  --  2.1  --   --   PHOS 3.9  --   --   --   --   --   --   --   --   --   --   < > = values in this interval not displayed.  Liver Function Tests:  Recent Labs Lab 06/01/16 0500 06/02/16 0453  AST 41 23  ALT 17 15*  ALKPHOS 67 64  BILITOT 1.9* 1.4*  PROT 5.6* 5.7*  ALBUMIN 2.7* 2.4*   No results for input(s): LIPASE, AMYLASE in the last 168 hours. No results for input(s): AMMONIA in the last 168 hours.  CBC:  Recent Labs Lab 06/03/16 0411  06/04/16 0446 06/04/16 1613 06/05/16 0624 06/06/16 0016 06/07/16 0238  WBC 8.1  --  7.5  --  6.2 7.1 8.7  NEUTROABS 6.3  --  5.6  --  4.4 5.2 6.7  HGB 7.9*  < > 7.8* 8.8* 7.8* 7.6* 8.5*  HCT 24.4*  < > 24.1* 26.0* 25.0* 24.4* 26.9*  MCV 87.1  --  87.0  --  88.0 88.7 86.5  PLT 158  --  184  --  203 193 213  <  > = values in this interval not displayed.  INR:  Recent Labs Lab 06/03/16 0411 06/04/16 0446 06/05/16 0624 06/06/16 0016 06/07/16 0238  INR 1.19 1.21 1.53 1.52 1.60    Other results:  EKG:   Imaging: Dg Chest 2 View  Result Date: 06/06/2016 CLINICAL DATA:  Left ventricular assist device . EXAM: CHEST  2 VIEW COMPARISON:  06/05/2016 . FINDINGS: Left ventricular assist device stable position. Cardiac pacer stable position. Prior median sternotomy. Stable cardiomegaly with diffuse bilateral pulmonary interstitial prominence and small bilateral pleural effusions consistent CHF. No change . No pneumothorax. IMPRESSION: 1. Left ventricular assist device and cardiac pacer in stable position. 2. Prior the sternotomy. Stable cardiomegaly with diffuse bilateral pulmonary interstitial prominence and small bilateral pleural effusions consistent CHF. No interim change . Electronically Signed   By: Maisie Fus  Register   On: 06/06/2016 13:12     Medications:     Scheduled Medications: . amiodarone  200 mg Oral BID  . aspirin EC  325 mg Oral Daily   Or  . aspirin  324 mg Per Tube Daily   Or  . aspirin  300 mg Rectal Daily  . bisacodyl  10 mg Oral Daily   Or  . bisacodyl  10 mg Rectal Daily  . docusate sodium  200 mg Oral Daily  . ferrous fumarate-b12-vitamic C-folic acid  1 capsule Oral BID BM  . furosemide  80 mg Intravenous BID  . guaiFENesin  600 mg Oral BID  . insulin aspart  0-15 Units Subcutaneous TID WC  . insulin aspart  0-5 Units Subcutaneous QHS  . insulin detemir  14 Units Subcutaneous QHS  . insulin detemir  18 Units Subcutaneous Daily  . losartan  25 mg Oral BID  . mouth rinse  15 mL Mouth Rinse BID  . oxymetazoline  1 spray Each Nare BID  . pantoprazole  40 mg Oral Daily  . potassium chloride  40 mEq Oral BID  . sildenafil  20 mg Oral TID  . sodium chloride flush  3 mL Intravenous Q12H  . spironolactone  25 mg Oral Daily  .  warfarin  7.5 mg Oral q1800  . Warfarin -  Pharmacist Dosing Inpatient   Does not apply q1800    Infusions: . sodium chloride    . sodium chloride    . sodium chloride 10 mL/hr at 06/07/16 1300  . heparin 600 Units/hr (06/07/16 1716)    PRN Medications: hydrALAZINE, ondansetron (ZOFRAN) IV, oxyCODONE, sodium chloride flush, traMADol   Assessment:  Acute on chronic systolic heart failure from ischemic cardiomyopathy Previous placement of AICD pacemaker system Preoperative pulmonary  hypertension treated with sildenafil  Recent smoker history Expected postop blood loss anemia  Plan/Discussion:    Patient Remains on IV heparin. Coumadin dosing 7.5 mg this p.m. Stop heparin and reduce aspirin to 81 mg when INR reaches 2.0 Plan on removing right Pleurx catheter at least 12 hours after heparin has been stopped Incisions all clean and dry Cont patient /family education I reviewed the LVAD parameters from today, and compared the results to the patient's prior recorded data.  No programming changes were made.  The LVAD is functioning within specified parameters.  The patient performs LVAD self-test daily.  LVAD interrogation was negative for any significant power changes, alarms or PI events/speed drops.  LVAD equipment check completed and is in good working order.  Back-up equipment present.   LVAD education done on emergency procedures and precautions and reviewed exit site care.  Length of Stay: [address]  Kathlee Nations Palmarejo III 06/07/2016, 6:51 PM

## 2016-06-07 NOTE — Progress Notes (Signed)
Occupational Therapy Treatment Patient Details Name: [name] MRN: [id number] DOB: [birth year] Today's Date: 06/07/2016    History of present illness Pt adm with  Acute on chronic systolic CHF due to ICM and plans for LVAD after insurance approval. 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 PMH - Rt pleural effusion with pleurx, CHF, DM, HTN, CAD, PVD. BLE bypass grafts, AICD   OT comments  Pt continues to make excellent progress.  He ambulated ~442ft, and is able to perform ADLs with min A to supervision level.   He is aware of sternal precautions and associated risks, but chooses to use UEs minimally to assist with mobility.  He is able to manage LVAD equipment autonomously.     Follow Up Recommendations  No OT follow up;Supervision/Assistance - 24 hour    Equipment Recommendations  3 in 1 bedside commode    Recommendations for Other Services      Precautions / Restrictions Precautions Precautions: Sternal;Fall Precaution Comments: Pt aware of sternal precautions but uses UE to some extent to rise from sitting. Pt aware of risk. Restrictions Weight Bearing Restrictions:  (sternal precautions) Other Position/Activity Restrictions: sternal precautions        Mobility Bed Mobility Overal bed mobility: Needs Assistance Bed Mobility: Sidelying to Sit   Sidelying to sit: Supervision       General bed mobility comments: Pt relies on UEs to some extent.   He is aware of sternal precautions, and associated risks, but chooses to use UEs   Transfers Overall transfer level: Needs assistance Equipment used: None Transfers: Sit to/from [employer] Sit to Stand: Supervision Stand pivot transfers: Supervision       General transfer comment: Uses UE's minimally to come to stand    Balance Overall balance assessment: Needs assistance Sitting-balance support: No upper extremity supported;Feet supported Sitting balance-Leahy Scale: Normal     Standing balance  support: No upper extremity supported Standing balance-Leahy Scale: Fair                             ADL either performed or assessed with clinical judgement   ADL Overall ADL's : Needs assistance/impaired     Grooming: Wash/dry hands;Wash/dry face;Oral care;Brushing hair;Supervision/safety;Standing               Lower Body Dressing: Supervision/safety;Sit to/from stand   Toilet Transfer: Supervision/safety;Ambulation   Toileting- Clothing Manipulation and Hygiene: Supervision/safety;Sit to/from stand       Functional mobility during ADLs: Supervision/safety General ADL Comments: Pt able to manage LVAD equipment independently.   He has not started bathing on his own yet - encouraged him to do so tomorrow.       Vision       Perception     Praxis      Cognition Arousal/Alertness: Awake/alert Behavior During Therapy: WFL for tasks assessed/performed Overall Cognitive Status: Within Functional Limits for tasks assessed                                          Exercises     Shoulder Instructions       General Comments      Pertinent Vitals/ Pain       Pain Assessment: Faces Faces Pain Scale: Hurts little more Pain Location: chest/incision Pain Descriptors / Indicators: Grimacing Pain Intervention(s): Monitored during session  Home Living  Prior Functioning/Environment              Frequency  Min 2X/week        Progress Toward Goals  OT Goals(current goals can now be found in the care plan section)  Progress towards OT goals: Progressing toward goals     Plan Discharge plan remains appropriate    Co-evaluation                 AM-PAC PT "6 Clicks" Daily Activity     Outcome Measure   Help from another person eating meals?: None Help from another person taking care of personal grooming?: A Little Help from another person toileting, which  includes using toliet, bedpan, or urinal?: A Little Help from another person bathing (including washing, rinsing, drying)?: A Little Help from another person to put on and taking off regular upper body clothing?: A Little Help from another person to put on and taking off regular lower body clothing?: A Little 6 Click Score: 19    End of Session    OT Visit Diagnosis: Unsteadiness on feet (R26.81)   Activity Tolerance Patient tolerated treatment well   Patient Left in bed;with call bell/phone within reach;Other (comment) (EOB )   Nurse Communication Mobility status        Time: [id number] OT Time Calculation (min): 21 min  Charges: OT General Charges $OT Visit: 1 Procedure OT Treatments $Therapeutic Activity: 8-22 mins  Reynolds AmericanWendi Shaylah Mcghie, OTR/L [phone]    Jeani HawkingConarpe, Landree Fernholz M 06/07/2016, 4:40 PM

## 2016-06-07 NOTE — Progress Notes (Signed)
Appx 5 mls drained from PLUERX catheter. Dr. Donata ClayVan Trigt made aware.  Marcellus ScottLesley Majesty Stehlin RN, VAD Coordinator 24/7 pager [phone]

## 2016-06-07 NOTE — Progress Notes (Signed)
Patient ID: [name], male   DOB: 1955-10-02, 61 y.o.   MRN: [id number]   HVAD  Rounding Note  Subjective:    Post Op Day 7 HVAD   He is now off milrinone with good co-ox.  Entresto stopped 6/3 with fall in MAP, now back on losartan 25 mg bid.  MAP 70s today.   I/Os negative but weight stable.    Transfused 1 unit PRBCs yesterday, feels better today.  No dyspnea or fatigue.   Hgb 7.5>8.2>7.9>7.8>7.8>7.6>1 unit PRBCs>8.5 LDH 225>231>228>242  LVAD INTERROGATION:  HVAD:  Flow  5.6 liters/min, RPM 2800  Power 5.1  Peak 7.0, trough 4.5   Objective:    Vital Signs:   Temp:  [97.6 F (36.4 C)-98.8 F (37.1 C)] 97.6 F (36.4 C) (06/06 0524) Pulse Rate:  [80-93] 80 (06/06 0524) Resp:  [18-20] 18 (06/06 0524) BP: (89-91)/(72-74) 91/74 (06/05 1820) SpO2:  [96 %-100 %] 96 % (06/06 0524) Weight:  [227 lb 8 oz (103.2 kg)] 227 lb 8 oz (103.2 kg) (06/06 0524) Last BM Date: 06/06/16 Mean arterial Pressure 70s  Intake/Output:   Intake/Output Summary (Last 24 hours) at 06/07/16 0820 Last data filed at 06/07/16 0500  Gross per 24 hour  Intake             1604 ml  Output             2350 ml  Net             -746 ml     Physical Exam: GENERAL: NAD HEENT: normal   NECK: JVP 10-12 cm CARDIAC:  Mechanical heart sounds with LVAD hum present. Normal sound. Sternotomy looks good.  LUNGS:  Mildly decreased breath sounds right base.  ABDOMEN:  Soft NT/ND, good BS LVAD exit site:  Dressing dry and intact. No signs infection EXTREMITIES:  Warm and dry, no cyanosis, clubbing, rash. 1+ ankle edema.   NEUROLOGIC:  Alert and oriented x 4.  Moves all 4 without difficulty  Telemetry: Sinus 90s. Personally reviewed  .    Labs: Basic Metabolic Panel:  Recent Labs Lab 06/01/16 0500  06/02/16 0453  06/03/16 0411  06/04/16 0446 06/04/16 1613 06/05/16 0624 06/06/16 0016 06/07/16 0238  NA 130*  < > 135  < > 133*  < > 134* 134* 133* 132* 131*  K 3.9  < > 3.2*  < > 2.7*  < > 3.1* 4.1  4.1 4.3 4.1  CL 96*  < > 98*  < > 96*  < > 94* 93* 99* 97* 97*  CO2 26  --  29  --  29  --  30  --  29 28 26   GLUCOSE 203*  < > 104*  < > 118*  < > 62* 202* 60* 127* 132*  BUN 12  < > 12  < > 11  < > 9 10 9 10 8   CREATININE 0.78  < > 0.69  < > 0.71  < > 0.61 0.60* 0.51* 0.69 0.63  CALCIUM 8.1*  --  8.3*  --  8.0*  --  7.8*  --  8.0* 8.1* 8.1*  MG 2.0  --  1.8  --  1.7  --  2.0  --  2.1  --   --   PHOS 3.9  --   --   --   --   --   --   --   --   --   --   < > =  values in this interval not displayed.  Liver Function Tests:  Recent Labs Lab 06/01/16 0500 06/02/16 0453  AST 41 23  ALT 17 15*  ALKPHOS 67 64  BILITOT 1.9* 1.4*  PROT 5.6* 5.7*  ALBUMIN 2.7* 2.4*   No results for input(s): LIPASE, AMYLASE in the last 168 hours. No results for input(s): AMMONIA in the last 168 hours.  CBC:  Recent Labs Lab 06/03/16 0411  06/04/16 0446 06/04/16 1613 06/05/16 0624 06/06/16 0016 06/07/16 0238  WBC 8.1  --  7.5  --  6.2 7.1 8.7  NEUTROABS 6.3  --  5.6  --  4.4 5.2 6.7  HGB 7.9*  < > 7.8* 8.8* 7.8* 7.6* 8.5*  HCT 24.4*  < > 24.1* 26.0* 25.0* 24.4* 26.9*  MCV 87.1  --  87.0  --  88.0 88.7 86.5  PLT 158  --  184  --  203 193 213  < > = values in this interval not displayed.  INR:  Recent Labs Lab 06/03/16 0411 06/04/16 0446 06/05/16 0624 06/06/16 0016 06/07/16 0238  INR 1.19 1.21 1.53 1.52 1.60    Other results:   Imaging: Dg Chest 2 View  Result Date: 06/06/2016 CLINICAL DATA:  Left ventricular assist device . EXAM: CHEST  2 VIEW COMPARISON:  06/05/2016 . FINDINGS: Left ventricular assist device stable position. Cardiac pacer stable position. Prior median sternotomy. Stable cardiomegaly with diffuse bilateral pulmonary interstitial prominence and small bilateral pleural effusions consistent CHF. No change . No pneumothorax. IMPRESSION: 1. Left ventricular assist device and cardiac pacer in stable position. 2. Prior the sternotomy. Stable cardiomegaly with diffuse  bilateral pulmonary interstitial prominence and small bilateral pleural effusions consistent CHF. No interim change . Electronically Signed   By: Maisie Fushomas  Register   On: 06/06/2016 13:12     Medications:     Scheduled Medications: . amiodarone  200 mg Oral BID  . aspirin EC  325 mg Oral Daily   Or  . aspirin  324 mg Per Tube Daily   Or  . aspirin  300 mg Rectal Daily  . bisacodyl  10 mg Oral Daily   Or  . bisacodyl  10 mg Rectal Daily  . docusate sodium  200 mg Oral Daily  . ferrous fumarate-b12-vitamic C-folic acid  1 capsule Oral BID BM  . furosemide  80 mg Intravenous BID  . guaiFENesin  600 mg Oral BID  . insulin aspart  0-15 Units Subcutaneous TID WC  . insulin aspart  0-5 Units Subcutaneous QHS  . insulin detemir  18 Units Subcutaneous BID  . losartan  25 mg Oral BID  . mouth rinse  15 mL Mouth Rinse BID  . metolazone  2.5 mg Oral Once  . pantoprazole  40 mg Oral Daily  . potassium chloride  40 mEq Oral BID  . sildenafil  20 mg Oral TID  . sodium chloride flush  3 mL Intravenous Q12H  . spironolactone  25 mg Oral Daily  . warfarin  7.5 mg Oral q1800  . Warfarin - Pharmacist Dosing Inpatient   Does not apply q1800    Infusions: . sodium chloride    . sodium chloride    . sodium chloride 10 mL/hr at 06/05/16 1245  . heparin 600 Units/hr (06/06/16 0644)    PRN Medications: hydrALAZINE, ondansetron (ZOFRAN) IV, oxyCODONE, sodium chloride flush, traMADol   Assessment/Plan/Discussion    1. Acute on chronic systolic CHF: Ischemic cardiomyopathy.  St Jude ICD.  05/26/16 ECHO EF 20-25%.  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 Milrinone stopped 6/3, co-ox 63% afterwards.  Still volume overloaded on exam.   - Continue Lasix 80 mg IV bid, will give dose of metolazone 2.5 x 1 today.   - MAP controlled now on losartan 25 mg bid and spironolactone 25 daily. - Continue 324 mg aspirin + coumadin. INR 1.6, he is on heparin gtt to continue until INR > 1.8.   - Will plan for ramp echo tomorrow or  Friday.  3. R Pleural Effusion:  Plan to drain PleurX today, may have catheter removed before discharge.   4. CAD: 3V CAD S/P stent Prox LAD and PTCA to subtotally occluded OMI 01/2016  5. Pulmonary HTN: PA catheter out.  PA pressures were lower post-LVAD.  - Pre-op, he was on Revatio 20 mg tid.  Continue Revatio 20 mg tid.  6. Former Smoker.   7. HTN:  MAP 70s on losartan and spironolactone.  8. Anemia: Got 1u PRBCs on 6/1 and again on 6/5.  No obvious bleeding. Continue protonix. Appropriate increase in hgb to 8.5.  9. DM2: Insulin. Am blood glucose drops, will decrease pm Detemir.   I reviewed the HVAD parameters from today, and compared the results to the patient's prior recorded data.  No programming changes were made.  The HVAD is functioning within specified parameters.    Length of Stay: 14  Marca Ancona MD 06/07/2016, 8:20 AM  VAD Team --- VAD ISSUES ONLY--- Pager [phone] (7am - 7am)

## 2016-06-07 NOTE — Progress Notes (Signed)
LVAD Coordinator Inpatient Rounding Note:  Heartware LVAD implanted on 5/29/2018by Tharon Aquas Trigt.  Vital signs: HR: 89 Doppler Pressure: 72 Automatic BP: 105/90 (97) O2 Sat: 97 on RA Wt in lbs: 200 > 216> 230>227>227   LVAD interrogation reveals:  Speed: 2800 Flow: 5 Power: 5 Peak /Trough: 7/4.5 Alarms:none   Suction alarm ON  Alarm settings: Low flow: 3.0 High Power: 7.0   Drive Line: Daily using daily dressing kit with aquacel silver strips on exit site.  Gtts:  none  Blood products: 1 PC and 4 Plasma - in OR 05/30/16 1 unit PRBC- 06/06/16  ICD: St Jude single lead Therapy ON  Labs:  LDH trend: 234>237>225>231>228>242   INR trend: 1.36 > 1.19>1.19>1.21>1.53>1.52>1.6  Anticoagulation Plan: -INR Goal: 2-2.5 -ASA Dose: 325 (started 05/31/16)  Adverse Events on VAD: -none  Plan/Recommendations:   1. Daily dressing changes. Please continue to assist patients father with dressing changes. 2. Ramp echo later this week. 3. Possible removal of Pleurx catheter later this week depending on drainage today. 4. Father to be here around 1-2 pm. Please page VAD pager upon arrival to provide equipment education.  Balinda Quails RN, VAD Coordinator 24/7 pager [phone]

## 2016-06-07 NOTE — Progress Notes (Signed)
Nutrition Follow Up  DOCUMENTATION CODES:   Not applicable  INTERVENTION:    Continue Dysphagia 3, thin liquid diet   NUTRITION DIAGNOSIS:   Increased nutrient needs related to  (post-op healing) as evidenced by estimated needs, ongoing  GOAL:   Patient will meet greater than or equal to 90% of their needs, met  MONITOR:   PO intake, Supplement acceptance, Labs, Weight trends, Skin, I & O's  ASSESSMENT:   61 yo Male with PMH of chronic systolic CHF due to ICM; had rapid decline over past few months including requirement of PleurX catheter due to significant R pleural effusion and decompensation to NYHA Class IV symptoms. Pt has undergone LVAD work up and been approved for VAD placement by [place], but was initially refused due to "out of network" insurance through [facility].   Pt s/p procedure 5/29: Implantation of  HVAD LV Assist Device  Pt extubated 5/30. PO intake good at 100% per flowsheet records. Medications reviewed and include Lasix, Coumadin, and Pepcid.   Labs reviewed.  Sodium 131 (L).  CBG's [id number].  Diet Order:  DIET DYS 3 Room service appropriate? Yes; Fluid consistency: Thin  Skin:  Reviewed, no issues  Last BM:  6/5  Height:   Ht Readings from Last 1 Encounters:  05/30/16 6' (1.829 m)   Weight:   Wt Readings from Last 1 Encounters:  06/07/16 227 lb 8 oz (103.2 kg)   Ideal Body Weight:  81 kg  BMI:  Body mass index is 30.85 kg/m.  Estimated Nutritional Needs:   Kcal:  2200-2400  Protein:  110-120 gm  Fluid:  per MD  EDUCATION NEEDS:   No education needs identified at this time  Arthur Holms, RD, LDN Pager #: [phone] After-Hours Pager #: [phone]

## 2016-06-07 NOTE — Progress Notes (Signed)
Physical Therapy Treatment Patient Details Name: [name] MRN: [id number] DOB: [birth year] Today's Date: 06/07/2016    History of Present Illness Pt adm with  Acute on chronic systolic CHF due to ICM and plans for LVAD after insurance approval. 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 PMH - Rt pleural effusion with pleurx, CHF, DM, HTN, CAD, PVD. BLE bypass grafts, AICD    PT Comments    Pt continues to make good progress. Better recognition of fatigue and stopping to rest when needed.   Follow Up Recommendations  No PT follow up     Equipment Recommendations  None recommended by PT    Recommendations for Other Services       Precautions / Restrictions Precautions Precautions: Sternal;Fall Precaution Comments: Pt aware of sternal precautions but uses UE to some extent to rise from sitting. Pt aware of risk. Restrictions Weight Bearing Restrictions:  (sternal precautions) Other Position/Activity Restrictions: sternal precautions     Mobility  Bed Mobility               General bed mobility comments: Pt up in chair  Transfers Overall transfer level: Needs assistance Equipment used: None Transfers: Sit to/from Stand Sit to Stand: Supervision         General transfer comment: Uses UE's minimally to come to stand  Ambulation/Gait Ambulation/Gait assistance: Supervision Ambulation Distance (Feet): 500 Feet Assistive device:  (Iv pole) Gait Pattern/deviations: Step-through pattern;Decreased stride length;Drifts right/left Gait velocity: decr Gait velocity interpretation: Below normal speed for age/gender General Gait Details: Occasional drift but no loss of balance. Pt took one standing rest break. Pt with more awareness of fatigue and recognized need to rest   Stairs Stairs: Yes   Stair Management: One rail Right;Step to pattern;Forwards Number of Stairs: 3    Wheelchair Mobility    Modified Rankin (Stroke Patients Only)       Balance Overall  balance assessment: Needs assistance Sitting-balance support: No upper extremity supported;Feet supported Sitting balance-Leahy Scale: Normal     Standing balance support: No upper extremity supported Standing balance-Leahy Scale: Fair                              Cognition Arousal/Alertness: Awake/alert Behavior During Therapy: WFL for tasks assessed/performed Overall Cognitive Status: Within Functional Limits for tasks assessed                                        Exercises      General Comments        Pertinent Vitals/Pain Pain Assessment: No/denies pain    Home Living                      Prior Function            PT Goals (current goals can now be found in the care plan section) Progress towards PT goals: Progressing toward goals    Frequency    Min 3X/week      PT Plan Current plan remains appropriate    Co-evaluation              AM-PAC PT "6 Clicks" Daily Activity  Outcome Measure  Difficulty turning over in bed (including adjusting bedclothes, sheets and blankets)?: A Little Difficulty moving from lying on back to sitting on the side of the bed? : A Little  Difficulty sitting down on and standing up from a chair with arms (e.g., wheelchair, bedside commode, etc,.)?: A Little Help needed moving to and from a bed to chair (including a wheelchair)?: A Little Help needed walking in hospital room?: A Little Help needed climbing 3-5 steps with a railing? : A Little 6 Click Score: 18    End of Session Equipment Utilized During Treatment: Gait belt Activity Tolerance: Patient tolerated treatment well Patient left: in chair;with call bell/phone within reach Nurse Communication: Mobility status PT Visit Diagnosis: Unsteadiness on feet (R26.81);Difficulty in walking, not elsewhere classified (R26.2)     Time: 1119-1130 PT Time Calculation (min) (ACUTE ONLY): 11 min  Charges:  $Gait Training: 8-22 mins                     G Codes:       [facility] PT [phone]    Angelina Ok [facility] 06/07/2016, 1:44 PM

## 2016-06-08 ENCOUNTER — Encounter: Payer: Self-pay | Admitting: Vascular Surgery

## 2016-06-08 ENCOUNTER — Inpatient Hospital Stay (HOSPITAL_COMMUNITY): Payer: BLUE CROSS/BLUE SHIELD

## 2016-06-08 ENCOUNTER — Inpatient Hospital Stay (HOSPITAL_COMMUNITY): Admission: RE | Admit: 2016-06-08 | Payer: Self-pay | Source: Ambulatory Visit

## 2016-06-08 DIAGNOSIS — I5032 Chronic diastolic (congestive) heart failure: Secondary | ICD-10-CM

## 2016-06-08 LAB — PROTIME-INR
INR: 1.53
Prothrombin Time: 18.5 seconds — ABNORMAL HIGH (ref 11.4–15.2)

## 2016-06-08 LAB — BASIC METABOLIC PANEL
Anion gap: 11 (ref 5–15)
BUN: 10 mg/dL (ref 6–20)
CO2: 28 mmol/L (ref 22–32)
Calcium: 8.3 mg/dL — ABNORMAL LOW (ref 8.9–10.3)
Chloride: 93 mmol/L — ABNORMAL LOW (ref 101–111)
Creatinine, Ser: 0.71 mg/dL (ref 0.61–1.24)
GFR calc Af Amer: >60 mL/min (ref >60)
GFR calc non Af Amer: >60 mL/min (ref >60)
Glucose, Bld: 96 mg/dL (ref 65–99)
Potassium: 3.9 mmol/L (ref 3.5–5.1)
Sodium: 132 mmol/L — ABNORMAL LOW (ref 135–145)

## 2016-06-08 LAB — GLUCOSE, CAPILLARY
GLUCOSE-CAPILLARY: 45 mg/dL — AB (ref 65–99)
GLUCOSE-CAPILLARY: 159 mg/dL — AB (ref 65–99)
GLUCOSE-CAPILLARY: 208 mg/dL — AB (ref 65–99)
Glucose-Capillary: 82 mg/dL (ref 65–99)
Glucose-Capillary: 143 mg/dL — ABNORMAL HIGH (ref 65–99)

## 2016-06-08 LAB — CBC WITH DIFFERENTIAL/PLATELET
Basophils Absolute: 0 10*3/uL (ref 0.0–0.1)
Basophils Relative: 0 %
Eosinophils Absolute: 0.3 10*3/uL (ref 0.0–0.7)
Eosinophils Relative: 3 %
HCT: 26.8 % — ABNORMAL LOW (ref 39.0–52.0)
Hemoglobin: 8.7 g/dL — ABNORMAL LOW (ref 13.0–17.0)
Lymphocytes Relative: 13 %
Lymphs Abs: 1.1 10*3/uL (ref 0.7–4.0)
MCH: 28.4 pg (ref 26.0–34.0)
MCHC: 32.5 g/dL (ref 30.0–36.0)
MCV: 87.6 fL (ref 78.0–100.0)
Monocytes Absolute: 0.6 10*3/uL (ref 0.1–1.0)
Monocytes Relative: 7 %
Neutro Abs: 6.4 10*3/uL (ref 1.7–7.7)
Neutrophils Relative %: 77 %
Platelets: 248 10*3/uL (ref 150–400)
RBC: 3.06 MIL/uL — ABNORMAL LOW (ref 4.22–5.81)
RDW: 17.9 % — ABNORMAL HIGH (ref 11.5–15.5)
WBC: 8.4 10*3/uL (ref 4.0–10.5)

## 2016-06-08 LAB — ECHOCARDIOGRAM COMPLETE
Height: 72 in
Weight: 3614.4 oz

## 2016-06-08 LAB — LACTATE DEHYDROGENASE: LDH: 236 U/L — ABNORMAL HIGH (ref 98–192)

## 2016-06-08 LAB — HEPARIN LEVEL (UNFRACTIONATED)

## 2016-06-08 MED ORDER — INSULIN DETEMIR 100 UNIT/ML ~~LOC~~ SOLN: 14.0000 [IU] | Freq: Every day | SUBCUTANEOUS | Status: DC

## 2016-06-08 MED ORDER — WARFARIN SODIUM 10 MG PO TABS
10.0000 mg | ORAL_TABLET | Freq: Once | ORAL | Status: AC
  Administered 2016-06-08: 10 mg via ORAL
  Filled 2016-06-08: qty 1

## 2016-06-08 MED ORDER — COUMADIN BOOK
Freq: Once | Status: AC
  Administered 2016-06-08: 09:00:00
  Filled 2016-06-08: qty 1

## 2016-06-08 MED ORDER — WARFARIN VIDEO
Freq: Once | Status: AC
  Administered 2016-06-08: 09:00:00

## 2016-06-08 MED ORDER — METOLAZONE 2.5 MG PO TABS
2.5000 mg | ORAL_TABLET | Freq: Once | ORAL | Status: AC
  Administered 2016-06-08: 2.5 mg via ORAL
  Filled 2016-06-08: qty 1

## 2016-06-08 NOTE — Progress Notes (Signed)
CSW met with patient at bedside for support. Patient rpeorts his recovery is going well and his father has been in multiple times to complete the dressing changes. Patient is hopeful to be discharged home next Monday. CSW offered supportive intervention and will continue to follow as needed through the outpatient LVAD clinic. Raquel Sarna, [place], [place]

## 2016-06-08 NOTE — Progress Notes (Addendum)
  Echocardiogram 2D Echocardiogram has been performed. Technically difficult study due to LVAD.  [name] 06/08/2016, 12:59 PM

## 2016-06-08 NOTE — Progress Notes (Signed)
Physical Therapy Discharge Patient Details Name: [name] MRN: [id number] DOB: [birth year] Today's Date: 06/08/2016 Time: 2417-5301 PT Time Calculation (min) (ACUTE ONLY): 14 min  Patient discharged from PT services secondary to goals met and no further PT needs identified.  Please see latest therapy progress note for current level of functioning and progress toward goals.    Progress and discharge plan discussed with patient and/or caregiver: Patient/Caregiver agrees with plan  GP     Shary Decamp [facility] 06/08/2016, 3:09 PM  [facility] PT [phone]

## 2016-06-08 NOTE — Progress Notes (Signed)
ANTICOAGULATION CONSULT NOTE - Follow Up Consult  Pharmacy Consult for Warfarin / Heparin  Indication: LVAD  Allergies  Allergen Reactions  . Metformin And Related Nausea And Vomiting    *Only the extended release* (does this)  . Niacin And Related Other (See Comments)    REACTION IS SIDE EFFECT "SEVERE" FLUSHING    Patient Measurements: Height: 6' (182.9 cm) Weight: 225 lb 14.4 oz (102.5 kg) IBW/kg (Calculated) : 77.6   Vital Signs: Temp: 98.9 F (37.2 C) (06/07 0544) Temp Source: Oral (06/07 0544) Pulse Rate: 86 (06/07 0544)  Labs:  Recent Labs  06/06/16 0016 06/07/16 0238 06/08/16 0207  HGB 7.6* 8.5* 8.7*  HCT 24.4* 26.9* 26.8*  PLT 193 213 248  LABPROT 18.5* 19.2* 18.5*  INR 1.52 1.60 1.53  HEPARINUNFRC <0.10* <0.10* <0.10*  CREATININE 0.69 0.63 0.71    Estimated Creatinine Clearance: 120.1 mL/min (by C-G formula based on SCr of 0.71 mg/dL).  Medications: Heparin @ 600 units/hr  Assessment: 60yom with CAD- PCI LAD 1/18 on plavix PTA held for LVAD implant - Heartware 05/30/2016.   Warfarin started 5/30 post op day #1. INR remained low and heparin bridge added 6/3.   INR 1.53 today and not moving with 7.5mg  doses. Heparin level is undetectable again. Spoke with Dr. Maren BeachVanTrigt - will not increase heparin rate as patient still has pleurx catheter and pacing wires. Will increase coumadin dose tonight.  Hgb up to 8.7 - received 1 unit PRBC on 6/1 and 6/6. LDH stable. No further nosebleeds today.   Goal of Therapy:  INR 2-3 Monitor platelets by anticoagulation protocol: Yes   Plan:  1) Warfarin 10mg  again tonight 2) Continue heparin at 600 units/hr  3) Daily  INR, CBC, LDH, will discontinue daily heparin levels for now  Louie CasaJennifer Naftali Carchi, PharmD, BCPS 06/08/2016 11:24 AM

## 2016-06-08 NOTE — Progress Notes (Signed)
LVAD Coordinator Inpatient Rounding Note:  Heartware LVAD implanted on 5/29/2018by Tharon Aquas Trigt.  Vital signs: HR: 86 Doppler Pressure: 68 Automatic BP: not done O2 Sat: 98 on RA Wt in lbs: 200 > 216> 230>227>227>225   LVAD interrogation reveals:  Speed: 2800 Flow: 5.8 Power: 5.1 Peak /Trough: 7/4.5 Alarms:none   Suction alarm ON  Alarm settings: Low flow: 3.0 High Power: 7.0   Drive Line: Daily using daily dressing kit with aquacel silver strips on exit site.  Gtts:  none  Blood products: 1 PC and 4 Plasma - in OR 05/30/16 1 unit PRBC- 06/06/16  ICD: St Jude single lead Therapy ON  Labs:  LDH trend: 234>237>225>231>228>242 >236  INR trend: 1.36 > 1.19>1.19>1.21>1.53>1.52>1.6>1.53  Anticoagulation Plan: -INR Goal: 2-2.5 -ASA Dose: 325 (started 05/31/16)  Adverse Events on VAD: -none  Plan/Recommendations:   1. Daily dressing changes. Please continue to assist patients father with dressing changes. 2. Ramp echo Friday, 06/09/16 3. Planned removal of Pleurx catheter after heparin gtt is off 4. Home VAD equipment ordered; possible discharge home Monday 06/12/16.  Zada Girt RN, VAD Coordinator 24/7 pager [phone]

## 2016-06-08 NOTE — Progress Notes (Signed)
CARDIAC REHAB PHASE I   PRE:  Rate/Rhythm: 88 SR    BP: sitting 74    SaO2: 97 RA  MODE:  Ambulation: 690 ft   POST:  Rate/Rhythm: 105 ST    BP: sitting 76     SaO2: 98 RA   Pt eager to walk. Already on batteries. He packed his extra bag and donned VAD back around his neck. He is standing better, independent. Pushed IV pole and did not require assist. Tired and thighs hurting toward end of walk, no rest. He is intermittent unsteady but able to correct. To EOB. Discussed elevating feet due to swelling, he sts he will later. Gave pt permission to walk independently close to nursing station. Pt thankful. [phone]  Harriet MassonRandi Kristan Shakara Tweedy CES, ACSM 06/08/2016 11:40 AM

## 2016-06-08 NOTE — Progress Notes (Signed)
Patient ID: [name], male   DOB: 09-Sep-1955, 61 y.o.   MRN: [id number]   HVAD  Rounding Note  Subjective:    Post Op Day 8 HVAD   Tolerated milrinone wean with stable coox.  Entresto stopped 6/3 with fall in MAP, now back on losartan 25 mg bid.  MAP stable in 70s now.   I/Os negative 1.7 L and down 2 lbs.     Transfused 1 unit PRBCs 06/06/16  Feels OK today. Denies SOB or CP. No lightheadedness or dizziness. Slightly anxious about going home.   Hgb 7.5>8.2>7.9>7.8>7.8>7.6>1 unit PRBCs>8.5>8.4 LDH 225>231>228>242>236  LVAD INTERROGATION:  HVAD:  Flow  5.8 liters/min, RPM 2800 Power 5.8  Peak 7.2, trough 4.5  Objective:    Vital Signs:   Temp:  [98.5 F (36.9 C)-98.9 F (37.2 C)] 98.9 F (37.2 C) (06/07 0544) Pulse Rate:  [82-86] 86 (06/07 0544) Resp:  [18] 18 (06/07 0544) SpO2:  [99 %] 99 % (06/07 0544) Weight:  [225 lb 14.4 oz (102.5 kg)] 225 lb 14.4 oz (102.5 kg) (06/07 0544) Last BM Date: 06/06/16 Mean arterial Pressure 60-70s  Intake/Output:   Intake/Output Summary (Last 24 hours) at 06/08/16 0838 Last data filed at 06/08/16 0456  Gross per 24 hour  Intake           322.87 ml  Output             2050 ml  Net         -1727.13 ml    Physical Exam: GENERAL: Fatigued. NAD.  HEENT: Normal. NECK: Supple, JVP 10-11 cm. Carotids OK.  CARDIAC:  Mechanical heart sounds with LVAD hum present.  LUNGS:  CTAB, normal effort.  ABDOMEN:  NT, ND, no HSM. No bruits or masses. +BS  LVAD exit site: Well-healed and incorporated. Dressing dry and intact. No erythema or drainage. Stabilization device present and accurately applied. Driveline dressing changed daily per sterile technique. EXTREMITIES:  Warm and dry. No cyanosis, clubbing, or rash.  NEUROLOGIC:  Alert & oriented x 3. Cranial nerves grossly intact. Moves all 4 extremities w/o difficulty. Affect pleasant     Telemetry: Personally reviewed, NSR 80s   Labs: Basic Metabolic Panel:  Recent Labs Lab 06/02/16 0453   06/03/16 0411  06/04/16 4782 06/04/16 1613 06/05/16 9562 06/06/16 0016 06/07/16 0238 06/08/16 0207  NA 135  < > 133*  < > 134* 134* 133* 132* 131* 132*  K 3.2*  < > 2.7*  < > 3.1* 4.1 4.1 4.3 4.1 3.9  CL 98*  < > 96*  < > 94* 93* 99* 97* 97* 93*  CO2 29  --  29  --  30  --  29 28 26 28   GLUCOSE 104*  < > 118*  < > 62* 202* 60* 127* 132* 96  BUN 12  < > 11  < > 9 10 9 10 8 10   CREATININE 0.69  < > 0.71  < > 0.61 0.60* 0.51* 0.69 0.63 0.71  CALCIUM 8.3*  --  8.0*  --  7.8*  --  8.0* 8.1* 8.1* 8.3*  MG 1.8  --  1.7  --  2.0  --  2.1  --   --   --   < > = values in this interval not displayed.  Liver Function Tests:  Recent Labs Lab 06/02/16 0453  AST 23  ALT 15*  ALKPHOS 64  BILITOT 1.4*  PROT 5.7*  ALBUMIN 2.4*   No results for input(s):  LIPASE, AMYLASE in the last 168 hours. No results for input(s): AMMONIA in the last 168 hours.  CBC:  Recent Labs Lab 06/04/16 0446 06/04/16 1613 06/05/16 0624 06/06/16 0016 06/07/16 0238 06/08/16 0207  WBC 7.5  --  6.2 7.1 8.7 8.4  NEUTROABS 5.6  --  4.4 5.2 6.7 6.4  HGB 7.8* 8.8* 7.8* 7.6* 8.5* 8.7*  HCT 24.1* 26.0* 25.0* 24.4* 26.9* 26.8*  MCV 87.0  --  88.0 88.7 86.5 87.6  PLT 184  --  203 193 213 248    INR:  Recent Labs Lab 06/04/16 0446 06/05/16 0624 06/06/16 0016 06/07/16 0238 06/08/16 0207  INR 1.21 1.53 1.52 1.60 1.53    Other results:   Imaging: Dg Chest 2 View  Result Date: 06/06/2016 CLINICAL DATA:  Left ventricular assist device . EXAM: CHEST  2 VIEW COMPARISON:  06/05/2016 . FINDINGS: Left ventricular assist device stable position. Cardiac pacer stable position. Prior median sternotomy. Stable cardiomegaly with diffuse bilateral pulmonary interstitial prominence and small bilateral pleural effusions consistent CHF. No change . No pneumothorax. IMPRESSION: 1. Left ventricular assist device and cardiac pacer in stable position. 2. Prior the sternotomy. Stable cardiomegaly with diffuse bilateral pulmonary  interstitial prominence and small bilateral pleural effusions consistent CHF. No interim change . Electronically Signed   By: Maisie Fushomas  Register   On: 06/06/2016 13:12     Medications:     Scheduled Medications: . amiodarone  200 mg Oral BID  . aspirin EC  325 mg Oral Daily   Or  . aspirin  324 mg Per Tube Daily   Or  . aspirin  300 mg Rectal Daily  . bisacodyl  10 mg Oral Daily   Or  . bisacodyl  10 mg Rectal Daily  . docusate sodium  200 mg Oral Daily  . ferrous fumarate-b12-vitamic C-folic acid  1 capsule Oral BID BM  . furosemide  80 mg Intravenous BID  . guaiFENesin  600 mg Oral BID  . insulin aspart  0-15 Units Subcutaneous TID WC  . insulin aspart  0-5 Units Subcutaneous QHS  . insulin detemir  18 Units Subcutaneous Daily  . losartan  25 mg Oral BID  . mouth rinse  15 mL Mouth Rinse BID  . oxymetazoline  1 spray Each Nare BID  . pantoprazole  40 mg Oral Daily  . potassium chloride  40 mEq Oral BID  . sildenafil  20 mg Oral TID  . sodium chloride flush  3 mL Intravenous Q12H  . spironolactone  25 mg Oral Daily  . warfarin  7.5 mg Oral q1800  . Warfarin - Pharmacist Dosing Inpatient   Does not apply q1800    Infusions: . sodium chloride    . sodium chloride    . sodium chloride 10 mL/hr at 06/07/16 1300  . heparin 600 Units/hr (06/07/16 1716)    PRN Medications: hydrALAZINE, ondansetron (ZOFRAN) IV, oxyCODONE, sodium chloride flush, traMADol   Assessment/Plan/Discussion    1. Acute on chronic systolic CHF: Ischemic cardiomyopathy.  St Jude ICD.  05/26/16 ECHO EF 20-25%.  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 Milrinone stopped 6/3, co-ox 63% afterwards.  Still volume overloaded on exam.   - Continue Lasix 80 mg IV bid. JVP remains elevated. Repeat metolazone 2.5 mg x 1 and follow K.  - MAP controlled now on losartan 25 mg bid and spironolactone 25 daily. - Continue 324 mg aspirin + coumadin. INR 1.53, he is on heparin gtt to continue until INR > 1.8.  Discussed coumadin with  Pharm D.   - Plan for ramp echo tomorrow.  3. R Pleural Effusion:  Plan to drain PleurX today, may have catheter removed before discharge.   4. CAD: 3V CAD S/P stent Prox LAD and PTCA to subtotally occluded OMI 01/2016  5. Pulmonary HTN: PA catheter out.  PA pressures were lower post-LVAD.  - Pre-op, he was on Revatio 20 mg tid.  Continue Revatio 20 mg tid.  6. Former Smoker.   7. HTN:  MAP 70s on losartan and spironolactone. No change.  8. Anemia: Got 1u PRBCs on 6/1 and again on 6/5.  No obvious bleeding. Continue protonix.  - Stable post transfusion at 8.7. NO overt bleeding.   9. DM2: Insulin.  - Has been hypoglycemic in ams. Insulin adjusted 06/07/16.   I reviewed the HVAD parameters from today, and compared the results to the patient's prior recorded data.  No programming changes were made.  The HVAD is functioning within specified parameters.    Length of Stay: [address]  [employer]  06/08/2016, 8:38 AM  VAD Team --- VAD ISSUES ONLY--- Pager [phone] (7am - 7am)  Patient seen with PA, agree with the above note.  Stable LVAD parameters.  Weight down 2 lbs with diuresis.  Still some volume overload, continue IV Lasix and will give another dose of metolazone today.  Remains on heparin gtt for subtherapeutic INR. Will stop heparin gtt when INR > 1.8, he can have his PleurX catheter removed when off heparin.  Hemoglobin stable.   Ramp echo scheduled for tomorrow.   Marca Ancona 06/08/2016 1:21 PM

## 2016-06-08 NOTE — Progress Notes (Signed)
Physical Therapy Treatment Patient Details Name: [name] MRN: [id number] DOB: 28-Mar-1955 Today's Date: 06/08/2016    History of Present Illness Pt adm with  Acute on chronic systolic CHF due to ICM and plans for LVAD after insurance approval. 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 PMH - Rt pleural effusion with pleurx, CHF, DM, HTN, CAD, PVD. BLE bypass grafts, AICD    PT Comments    Pt doing very well with mobility. Managing equipment appropriately. No further PT needed.   Follow Up Recommendations  No PT follow up     Equipment Recommendations  None recommended by PT    Recommendations for Other Services       Precautions / Restrictions Precautions Precautions: Sternal;Fall Precaution Comments: Pt aware of sternal precautions but uses UE to some extent to rise from sitting. Pt aware of risk. Restrictions Other Position/Activity Restrictions: sternal precautions     Mobility  Bed Mobility               General bed mobility comments: Pt on EOB but reports modified independent with bed mobility  Transfers Overall transfer level: Modified independent Equipment used: None Transfers: Sit to/from Stand Sit to Stand: Modified independent (Device/Increase time)         General transfer comment: Uses UE's minimally to come to stand  Ambulation/Gait Ambulation/Gait assistance: Modified independent (Device/Increase time) Ambulation Distance (Feet): 700 Feet Assistive device: None (Iv pole) Gait Pattern/deviations: Step-through pattern;Drifts right/left Gait velocity: decr Gait velocity interpretation: Below normal speed for age/gender General Gait Details: Steady gait with or without IV pole   Stairs     Stair Management: One rail Right;Step to pattern;Forwards Number of Stairs: 4    Wheelchair Mobility    Modified Rankin (Stroke Patients Only)       Balance Overall balance assessment: Needs assistance Sitting-balance support: No upper extremity  supported;Feet supported Sitting balance-Leahy Scale: Normal     Standing balance support: No upper extremity supported Standing balance-Leahy Scale: Good                              Cognition Arousal/Alertness: Awake/alert Behavior During Therapy: WFL for tasks assessed/performed Overall Cognitive Status: Within Functional Limits for tasks assessed                                        Exercises      General Comments        Pertinent Vitals/Pain Pain Assessment: No/denies pain    Home Living                      Prior Function            PT Goals (current goals can now be found in the care plan section) Progress towards PT goals: Goals met/education completed, patient discharged from PT    Frequency    Min 3X/week      PT Plan Current plan remains appropriate    Co-evaluation              AM-PAC PT "6 Clicks" Daily Activity  Outcome Measure  Difficulty turning over in bed (including adjusting bedclothes, sheets and blankets)?: None Difficulty moving from lying on back to sitting on the side of the bed? : None Difficulty sitting down on and standing up from a chair with arms (e.g., wheelchair,  bedside commode, etc,.)?: A Little Help needed moving to and from a bed to chair (including a wheelchair)?: None Help needed walking in hospital room?: None Help needed climbing 3-5 steps with a railing? : A Little 6 Click Score: 22    End of Session   Activity Tolerance: Patient tolerated treatment well Patient left: with call bell/phone within reach;in bed Nurse Communication: Mobility status PT Visit Diagnosis: Difficulty in walking, not elsewhere classified (R26.2)     Time: 4451-4604 PT Time Calculation (min) (ACUTE ONLY): 14 min  Charges:  $Gait Training: 8-22 mins                    G Codes:       [facility] PT [place] 06/08/2016, 3:08 PM

## 2016-06-08 NOTE — Progress Notes (Addendum)
HVAD  Rounding Note  Postop day 9  Subjective:   61 year old patient with ischemic cardiomyopathy and acute on chronic systolic heart failure who presented with fatigue and failure to thrive despite home IV milrinone and maximal medical therapy including daily drainage of pleural effusion with right Pleurx catheter  The patient was assessed and found to be candidate for destination therapy LVAD implantation. He was optimized with diuresis and Plavix washout prior to surgical implantation  Patient Feels better after transfusion 1 unit of packed cells for expected postoperative blood loss anemia. He had 2 good walks. Because his INR remains below 2.0 a heparin bridge is continued  Attempt at drainage of right Pleurx catheter removed only 10-15 cc. We'll plan on removing Pleurx catheter after heparin drip is off prior to discharge. Will DC EPWs when off iv heparin  2 view chest x-ray shows minimal interstitial edema, small right pleural effusion.  Postoperative coagulopathy  related to preoperative Plavix treated with platelet transfusion .  Expected postop blood loss anemia. Hemoglobin up to 8.5 after transfusion Coumadin per PharmD.  Plan VAD ramp echo study today  BP goal map < 85-90- well controlled on losartan, spironolactone  Good pulsatility on VAD waveforms  LVAD INTERROGATION:  VAD:  Flow 4.89ters/min, speed 2800 rpm , power 5.1,   Controller intact  Objective:    Vital Signs:   Temp:  [98.5 F (36.9 C)-98.9 F (37.2 C)] 98.9 F (37.2 C) (06/07 0544) Pulse Rate:  [82-86] 86 (06/07 0544) Resp:  [18] 18 (06/07 0544) SpO2:  [99 %] 99 % (06/07 0544) Weight:  [225 lb 14.4 oz (102.5 kg)] 225 lb 14.4 oz (102.5 kg) (06/07 0544) Last BM Date: 06/06/16 Mean arterial Pressure 88 mmHg  Intake/Output:   Intake/Output Summary (Last 24 hours) at 06/08/16 0915 Last data filed at 06/08/16 0853  Gross per 24 hour  Intake           322.87 ml  Output             2250 ml  Net          -1927.13 ml     Physical Exam: General:  Well appearing. No resp difficulty OOB in chair Incisions : clean and dry HEENT: normal Neck: supple. JVP . Carotids 2+ bilat; no bruits. No lymphadenopathy or thryomegaly appreciated. Cor: Mechanical heart sounds with LVAD hum present. Lungs: clear Abdomen: soft, nontender, nondistended. No hepatosplenomegaly. No bruits or masses. Good bowel sounds. Extremities: no cyanosis, clubbing, rash, edema Neuro: alert & orientedx3, cranial nerves grossly intact. moves all 4 extremities w/o difficulty. Affect pleasant  Telemetry:paced 90  Labs: Basic Metabolic Panel:  Recent Labs Lab 06/02/16 0453  06/03/16 0411  06/04/16 0446 06/04/16 1613 06/05/16 4098 06/06/16 0016 06/07/16 0238 06/08/16 0207  NA 135  < > 133*  < > 134* 134* 133* 132* 131* 132*  K 3.2*  < > 2.7*  < > 3.1* 4.1 4.1 4.3 4.1 3.9  CL 98*  < > 96*  < > 94* 93* 99* 97* 97* 93*  CO2 29  --  29  --  30  --  29 28 26 28   GLUCOSE 104*  < > 118*  < > 62* 202* 60* 127* 132* 96  BUN 12  < > 11  < > 9 10 9 10 8 10   CREATININE 0.69  < > 0.71  < > 0.61 0.60* 0.51* 0.69 0.63 0.71  CALCIUM 8.3*  --  8.0*  --  7.8*  --  8.0* 8.1* 8.1* 8.3*  MG 1.8  --  1.7  --  2.0  --  2.1  --   --   --   < > = values in this interval not displayed.  Liver Function Tests:  Recent Labs Lab 06/02/16 0453  AST 23  ALT 15*  ALKPHOS 64  BILITOT 1.4*  PROT 5.7*  ALBUMIN 2.4*   No results for input(s): LIPASE, AMYLASE in the last 168 hours. No results for input(s): AMMONIA in the last 168 hours.  CBC:  Recent Labs Lab 06/04/16 0446 06/04/16 1613 06/05/16 0624 06/06/16 0016 06/07/16 0238 06/08/16 0207  WBC 7.5  --  6.2 7.1 8.7 8.4  NEUTROABS 5.6  --  4.4 5.2 6.7 6.4  HGB 7.8* 8.8* 7.8* 7.6* 8.5* 8.7*  HCT 24.1* 26.0* 25.0* 24.4* 26.9* 26.8*  MCV 87.0  --  88.0 88.7 86.5 87.6  PLT 184  --  203 193 213 248    INR:  Recent Labs Lab 06/04/16 0446 06/05/16 0624 06/06/16 0016  06/07/16 0238 06/08/16 0207  INR 1.21 1.53 1.52 1.60 1.53    Other results:  EKG:   Imaging: Dg Chest 2 View  Result Date: 06/06/2016 CLINICAL DATA:  Left ventricular assist device . EXAM: CHEST  2 VIEW COMPARISON:  06/05/2016 . FINDINGS: Left ventricular assist device stable position. Cardiac pacer stable position. Prior median sternotomy. Stable cardiomegaly with diffuse bilateral pulmonary interstitial prominence and small bilateral pleural effusions consistent CHF. No change . No pneumothorax. IMPRESSION: 1. Left ventricular assist device and cardiac pacer in stable position. 2. Prior the sternotomy. Stable cardiomegaly with diffuse bilateral pulmonary interstitial prominence and small bilateral pleural effusions consistent CHF. No interim change . Electronically Signed   By: Maisie Fushomas  Register   On: 06/06/2016 13:12     Medications:     Scheduled Medications: . amiodarone  200 mg Oral BID  . aspirin EC  325 mg Oral Daily   Or  . aspirin  324 mg Per Tube Daily   Or  . aspirin  300 mg Rectal Daily  . bisacodyl  10 mg Oral Daily   Or  . bisacodyl  10 mg Rectal Daily  . coumadin book   Does not apply Once  . docusate sodium  200 mg Oral Daily  . ferrous fumarate-b12-vitamic C-folic acid  1 capsule Oral BID BM  . furosemide  80 mg Intravenous BID  . guaiFENesin  600 mg Oral BID  . insulin aspart  0-15 Units Subcutaneous TID WC  . insulin aspart  0-5 Units Subcutaneous QHS  . insulin detemir  18 Units Subcutaneous Daily  . losartan  25 mg Oral BID  . mouth rinse  15 mL Mouth Rinse BID  . metolazone  2.5 mg Oral Once  . oxymetazoline  1 spray Each Nare BID  . pantoprazole  40 mg Oral Daily  . potassium chloride  40 mEq Oral BID  . sildenafil  20 mg Oral TID  . sodium chloride flush  3 mL Intravenous Q12H  . spironolactone  25 mg Oral Daily  . warfarin  7.5 mg Oral q1800  . warfarin   Does not apply Once  . Warfarin - Pharmacist Dosing Inpatient   Does not apply q1800     Infusions: . sodium chloride    . sodium chloride    . sodium chloride 10 mL/hr at 06/07/16 1300  . heparin 600 Units/hr (06/07/16 1716)    PRN Medications: hydrALAZINE, ondansetron (ZOFRAN) IV, oxyCODONE,  sodium chloride flush, traMADol   Assessment:  Acute on chronic systolic heart failure from ischemic cardiomyopathy Previous placement of AICD pacemaker system Preoperative pulmonary  hypertension treated with sildenafil  Recent smoker history Expected postop blood loss anemia  Plan/Discussion:    Patient Remains on IV heparin. Coumadin dosing 10 mg this p.m. Stop heparin and reduce aspirin to 81 mg when INR reaches 2.0 Plan on removing right Pleurx catheter at least 12 hours after heparin has been stopped- also will remove EPWs at that time Incisions all clean and dry Cont patient /family education I reviewed the LVAD parameters from today, and compared the results to the patient's prior recorded data.  No programming changes were made.  The LVAD is functioning within specified parameters.  The patient performs LVAD self-test daily.  LVAD interrogation was negative for any significant power changes, alarms or PI events/speed drops.  LVAD equipment check completed and is in good working order.  Back-up equipment present.   LVAD education done on emergency procedures and precautions and reviewed exit site care.  Length of Stay: [address]  Kathlee Nations Trigt III 06/08/2016, 9:15 AM

## 2016-06-08 NOTE — Progress Notes (Signed)
Hypoglycemic Event  CBG: 45  Treatment: 120 mLs apple juice, graham crackers  Symptoms: None  Follow-up CBG: Time: 0617 CBG Result: 82  Possible Reasons for Event: Levemir dose at bedtime, low PO intake at night  Comments/MD notified: n/a    Margarito LinerStephanie M Eliani Leclere

## 2016-06-09 ENCOUNTER — Encounter (HOSPITAL_COMMUNITY): Payer: BLUE CROSS/BLUE SHIELD

## 2016-06-09 ENCOUNTER — Inpatient Hospital Stay (HOSPITAL_COMMUNITY): Payer: BLUE CROSS/BLUE SHIELD

## 2016-06-09 DIAGNOSIS — Z95811 Presence of heart assist device: Secondary | ICD-10-CM

## 2016-06-09 DIAGNOSIS — I340 Nonrheumatic mitral (valve) insufficiency: Secondary | ICD-10-CM

## 2016-06-09 DIAGNOSIS — I5023 Acute on chronic systolic (congestive) heart failure: Secondary | ICD-10-CM

## 2016-06-09 LAB — GLUCOSE, CAPILLARY
GLUCOSE-CAPILLARY: 193 mg/dL — AB (ref 65–99)
GLUCOSE-CAPILLARY: 170 mg/dL — AB (ref 65–99)
Glucose-Capillary: 183 mg/dL — ABNORMAL HIGH (ref 65–99)
Glucose-Capillary: 245 mg/dL — ABNORMAL HIGH (ref 65–99)

## 2016-06-09 LAB — PROTIME-INR
INR: 1.64
Prothrombin Time: 19.6 seconds — ABNORMAL HIGH (ref 11.4–15.2)

## 2016-06-09 LAB — BASIC METABOLIC PANEL
Anion gap: 9 (ref 5–15)
BUN: 13 mg/dL (ref 6–20)
CALCIUM: 8 mg/dL — AB (ref 8.9–10.3)
CHLORIDE: 93 mmol/L — AB (ref 101–111)
CO2: 28 mmol/L (ref 22–32)
CREATININE: 0.81 mg/dL (ref 0.61–1.24)
GFR calc non Af Amer: >60 mL/min (ref >60)
Glucose, Bld: 187 mg/dL — ABNORMAL HIGH (ref 65–99)
Potassium: 3.8 mmol/L (ref 3.5–5.1)
Sodium: 130 mmol/L — ABNORMAL LOW (ref 135–145)

## 2016-06-09 LAB — CBC WITH DIFFERENTIAL/PLATELET
Basophils Absolute: 0 10*3/uL (ref 0.0–0.1)
Basophils Relative: 0 %
Eosinophils Absolute: 0.2 10*3/uL (ref 0.0–0.7)
Eosinophils Relative: 3 %
HCT: 26.6 % — ABNORMAL LOW (ref 39.0–52.0)
Hemoglobin: 8.4 g/dL — ABNORMAL LOW (ref 13.0–17.0)
Lymphocytes Relative: 11 %
Lymphs Abs: 0.8 10*3/uL (ref 0.7–4.0)
MCH: 28 pg (ref 26.0–34.0)
MCHC: 31.6 g/dL (ref 30.0–36.0)
MCV: 88.7 fL (ref 78.0–100.0)
Monocytes Absolute: 0.6 10*3/uL (ref 0.1–1.0)
Monocytes Relative: 9 %
Neutro Abs: 5.6 10*3/uL (ref 1.7–7.7)
Neutrophils Relative %: 77 %
Platelets: 263 10*3/uL (ref 150–400)
RBC: 3 MIL/uL — ABNORMAL LOW (ref 4.22–5.81)
RDW: 18 % — ABNORMAL HIGH (ref 11.5–15.5)
WBC: 7.2 10*3/uL (ref 4.0–10.5)

## 2016-06-09 LAB — ECHOCARDIOGRAM LIMITED
Height: 72 in
WEIGHTICAEL: 3620.8 [oz_av]

## 2016-06-09 LAB — LACTATE DEHYDROGENASE: LDH: 226 U/L — ABNORMAL HIGH (ref 98–192)

## 2016-06-09 MED ORDER — WARFARIN SODIUM 10 MG PO TABS
10.0000 mg | ORAL_TABLET | Freq: Once | ORAL | Status: AC
  Administered 2016-06-09: 10 mg via ORAL
  Filled 2016-06-09: qty 1

## 2016-06-09 NOTE — Progress Notes (Signed)
ANTICOAGULATION CONSULT NOTE - Follow Up Consult  Pharmacy Consult for Warfarin / Heparin (MD dosing) Indication: HVAD  Allergies  Allergen Reactions  . Metformin And Related Nausea And Vomiting    *Only the extended release* (does this)  . Niacin And Related Other (See Comments)    REACTION IS SIDE EFFECT "SEVERE" FLUSHING    Patient Measurements: Height: 6' (182.9 cm) Weight: 226 lb 4.8 oz (102.6 kg) IBW/kg (Calculated) : 77.6   Vital Signs: Temp: 98.7 F (37.1 C) (06/08 0900) Temp Source: Oral (06/08 0900) Pulse Rate: 81 (06/08 0900)  Labs:  Recent Labs  06/07/16 0238 06/08/16 0207 06/09/16 0341  HGB 8.5* 8.7* 8.4*  HCT 26.9* 26.8* 26.6*  PLT 213 248 263  LABPROT 19.2* 18.5* 19.6*  INR 1.60 1.53 1.64  HEPARINUNFRC <0.10* <0.10*  --   CREATININE 0.63 0.71 0.81    Estimated Creatinine Clearance: 118.7 mL/min (by C-G formula based on SCr of 0.81 mg/dL).  Medications: Heparin @ 600 units/hr  Assessment: 60yom with CAD- PCI LAD 1/18 on plavix PTA held for LVAD implant - Heartware 05/30/2016.   Warfarin started 5/30 post op day #1. INR remained low and heparin bridge added 6/3.   INR 1.64, trending up slowly. Heparin level has been subtherapeutic on 600 units/hr, so daily levels were d/c'd.  Hgb 8.4. LDH stable. No further nosebleeds today.   Goal of Therapy:  INR 2-3 Monitor platelets by anticoagulation protocol: Yes   Plan:  1) Warfarin 10 mg again tonight 2) Continue heparin at 600 units/hr  3) Daily  INR, CBC, LDH. 4) Will need Coumadin w/ INR goal 2-3 + ASA 325 mg at discharge.  Dalton MooreJessica Markhi [name], Pharm D, BCPS  Clinical Pharmacist Pager [phone]  06/09/2016 11:13 AM

## 2016-06-09 NOTE — Progress Notes (Addendum)
Due to high referral volume, there will be a delay seeing this patient. PMT VAD provider will return Tuesday 6/12 and can see the patient then if he is still inpt. VAD team aware of delay.  Margret ChanceMelanie G. Analiese Krupka, RN, BSN, [facility] 06/09/2016 2:01 PM Cell [phone] 8:00-4:00 Monday-Friday Office [phone]

## 2016-06-09 NOTE — Progress Notes (Signed)
Speed  Flow  Power  LVIDD  AI  Aortic openings  MR  TR  Septum  RV   2800  5.8 5.1 4.6  closed  Trace  mild Bowing to right Mild hk    2900 5.7 5.5 4.5  closed trace mild midline Mild hk   3000 6.1 6.1 4.48  closed mild  Bowing to right Trace hk   3100 6.3 6.7 4.38  closed moderate  Bowing to left Mild hk                              Ramp ECHO performed at bedside.  At completion of ramp study, patients primary and back up controller programmed Fixed speed:3000   Marcellus ScottLesley Wilson RN, VAD Coordinator 24/7 pager [phone]

## 2016-06-09 NOTE — Progress Notes (Signed)
HVAD  Rounding Note  Postop day 10  Subjective:   61 year old patient with ischemic cardiomyopathy and acute on chronic systolic heart failure who presented with fatigue and failure to thrive despite home IV milrinone and maximal medical therapy including daily drainage of pleural effusion with right Pleurx catheter  The patient was assessed and found to be candidate for destination therapy LVAD implantation. He was optimized with diuresis and Plavix washout prior to surgical implantation  Patient Feels better after transfusion 1 unit of packed cells for expected postoperative blood loss anemia. He had 2 good walks. Because his INR remains below 1.8 a heparin bridge is continued  Attempt at drainage of right Pleurx catheter removed only 10-15 cc. We'll plan on removing Pleurx catheter after heparin drip is off prior to discharge. Will DC EPWs when off iv heparin  2 view chest x-ray shows minimal interstitial edema, small right pleural effusion.  Postoperative coagulopathy  related to preoperative Plavix treated with platelet transfusion .  Expected postop blood loss anemia. Hemoglobin up to 8.5 after transfusion Coumadin per PharmD.10 mg ordered this p.m.  VAD ramp echo study performed today with HVAD  speed increased to thousand rpm's. Inflow cannula appears to be directed towards the mitral valve annulus  BP goal map < 85-90- well controlled on losartan, spironolactone  Good pulsatility on VAD waveforms  LVAD INTERROGATION:  VAD:  Flow 4.89ters/min, speed 3000 rpm , power 5.1,   Controller intact  Objective:    Vital Signs:   Temp:  [98.7 F (37.1 C)-99.1 F (37.3 C)] 98.7 F (37.1 C) (06/08 0900) Pulse Rate:  [81-89] 81 (06/08 0900) Resp:  [18] 18 (06/08 0601) SpO2:  [96 %-98 %] 98 % (06/08 0900) Weight:  [226 lb 4.8 oz (102.6 kg)] 226 lb 4.8 oz (102.6 kg) (06/08 0601) Last BM Date: 06/08/16 Mean arterial Pressure 88 mmHg  Intake/Output:   Intake/Output Summary (Last 24  hours) at 06/09/16 1707 Last data filed at 06/09/16 1400  Gross per 24 hour  Intake           299.07 ml  Output             1880 ml  Net         -1580.93 ml     Physical Exam: General:  Well appearing. No resp difficulty OOB in chair Incisions : clean and dry HEENT: normal Neck: supple. JVP . Carotids 2+ bilat; no bruits. No lymphadenopathy or thryomegaly appreciated. Cor: Mechanical heart sounds with LVAD hum present. Lungs: clear Abdomen: soft, nontender, nondistended. No hepatosplenomegaly. No bruits or masses. Good bowel sounds. Extremities: no cyanosis, clubbing, rash, edema Neuro: alert & orientedx3, cranial nerves grossly intact. moves all 4 extremities w/o difficulty. Affect pleasant  Telemetry:paced 90  Labs: Basic Metabolic Panel:  Recent Labs Lab 06/03/16 0411  06/04/16 0446  06/05/16 [id number] 06/06/16 0016 06/07/16 0238 06/08/16 0207 06/09/16 0341  NA 133*  < > 134*  < > 133* 132* 131* 132* 130*  K 2.7*  < > 3.1*  < > 4.1 4.3 4.1 3.9 3.8  CL 96*  < > 94*  < > 99* 97* 97* 93* 93*  CO2 29  --  30  --  29 28 26 28 28   GLUCOSE 118*  < > 62*  < > 60* 127* 132* 96 187*  BUN 11  < > 9  < > 9 10 8 10 13   CREATININE 0.71  < > 0.61  < > 0.51* 0.69 0.63 0.71  0.81  CALCIUM 8.0*  --  7.8*  --  8.0* 8.1* 8.1* 8.3* 8.0*  MG 1.7  --  2.0  --  2.1  --   --   --   --   < > = values in this interval not displayed.  Liver Function Tests: No results for input(s): AST, ALT, ALKPHOS, BILITOT, PROT, ALBUMIN in the last 168 hours. No results for input(s): LIPASE, AMYLASE in the last 168 hours. No results for input(s): AMMONIA in the last 168 hours.  CBC:  Recent Labs Lab 06/05/16 0624 06/06/16 0016 06/07/16 0238 06/08/16 0207 06/09/16 0341  WBC 6.2 7.1 8.7 8.4 7.2  NEUTROABS 4.4 5.2 6.7 6.4 5.6  HGB 7.8* 7.6* 8.5* 8.7* 8.4*  HCT 25.0* 24.4* 26.9* 26.8* 26.6*  MCV 88.0 88.7 86.5 87.6 88.7  PLT 203 193 213 248 263    INR:  Recent Labs Lab 06/05/16 0624  06/06/16 0016 06/07/16 0238 06/08/16 0207 06/09/16 0341  INR 1.53 1.52 1.60 1.53 1.64    Other results:  EKG:   Imaging: No results found.   Medications:     Scheduled Medications: . amiodarone  200 mg Oral BID  . aspirin EC  325 mg Oral Daily   Or  . aspirin  324 mg Per Tube Daily   Or  . aspirin  300 mg Rectal Daily  . bisacodyl  10 mg Oral Daily   Or  . bisacodyl  10 mg Rectal Daily  . docusate sodium  200 mg Oral Daily  . ferrous fumarate-b12-vitamic C-folic acid  1 capsule Oral BID BM  . furosemide  80 mg Intravenous BID  . guaiFENesin  600 mg Oral BID  . insulin aspart  0-15 Units Subcutaneous TID WC  . insulin aspart  0-5 Units Subcutaneous QHS  . insulin detemir  18 Units Subcutaneous Daily  . losartan  25 mg Oral BID  . mouth rinse  15 mL Mouth Rinse BID  . oxymetazoline  1 spray Each Nare BID  . pantoprazole  40 mg Oral Daily  . potassium chloride  40 mEq Oral BID  . sildenafil  20 mg Oral TID  . sodium chloride flush  3 mL Intravenous Q12H  . spironolactone  25 mg Oral Daily  . warfarin  10 mg Oral ONCE-1800  . Warfarin - Pharmacist Dosing Inpatient   Does not apply q1800    Infusions: . sodium chloride    . sodium chloride    . sodium chloride 10 mL/hr at 06/07/16 1300  . heparin 600 Units/hr (06/07/16 1716)    PRN Medications: hydrALAZINE, ondansetron (ZOFRAN) IV, oxyCODONE, sodium chloride flush, traMADol   Assessment:  Acute on chronic systolic heart failure from ischemic cardiomyopathy Previous placement of AICD pacemaker system Preoperative pulmonary  hypertension treated with sildenafil  Recent smoker history Expected postop blood loss anemia  Plan/Discussion:    Patient Remains on IV heparin. Coumadin dosing 10 mg this p.m.Stop heparin when INR 1.8 or greater Stop heparin and reduce aspirin to 81 mg when INR reaches 2.0 Plan on removing right Pleurx catheter at least 12 hours after heparin has been stopped- also will remove EPWs  at that time Incisions all clean and dry Cont patient /family education I reviewed the LVAD parameters from today, and compared the results to the patient's prior recorded data.  No programming changes were made.  The LVAD is functioning within specified parameters.  The patient performs LVAD self-test daily.  LVAD interrogation was negative for any  significant power changes, alarms or PI events/speed drops.  LVAD equipment check completed and is in good working order.  Back-up equipment present.   LVAD education done on emergency procedures and precautions and reviewed exit site care.  Length of Stay: [address]  Kathlee Nations Lelia Lake III 06/09/2016, 5:07 PM

## 2016-06-09 NOTE — Progress Notes (Signed)
  Echocardiogram 2D Echocardiogram Limited RAMP has been performed.  [name], [name] 06/09/2016, 10:59 AM

## 2016-06-09 NOTE — Progress Notes (Signed)
Patient ID: [name], male   DOB: 01-29-1955, 61 y.o.   MRN: [id number]   HVAD  Rounding Note  Subjective:    S/P HVAD 05/30/2016   Entresto stopped 6/3 with fall in MAP, now back on losartan 25 mg bid.    Transfused 1 unit PRBCs 06/06/16  Yesterday diuresed with IV lasix + metolazone. Weight up 1 pound. Ambulated 700 feet. Good appetite.   Denies SOB. Denies pain.   Hgb 8.7> 8.4  LDH 236>226  LVAD INTERROGATION:  HVAD:  Flow  5.2 liters/min, RPM 2800   5.8 Watts  Peak 7.5, trough 4.7   Objective:    Vital Signs:   Temp:  [98.6 F (37 C)-99.1 F (37.3 C)] 98.8 F (37.1 C) (06/08 0601) Pulse Rate:  [83-89] 83 (06/08 0601) Resp:  [18] 18 (06/08 0601) SpO2:  [96 %-98 %] 96 % (06/08 0601) Weight:  [226 lb 4.8 oz (102.6 kg)] 226 lb 4.8 oz (102.6 kg) (06/08 0601) Last BM Date: 06/08/16 Mean arterial Pressure 70s  Intake/Output:   Intake/Output Summary (Last 24 hours) at 06/09/16 0742 Last data filed at 06/09/16 0601  Gross per 24 hour  Intake           1700.6 ml  Output             2630 ml  Net           -929.4 ml     Physical Exam: GENERAL: NAD Sitting on the side of the bed.  HEENT: normal  NECK: Supple, JVP 8-9 .  2+ bilaterally, no bruits.  No lymphadenopathy or thyromegaly appreciated.   CARDIAC:  Mechanical heart sounds with LVAD hum present.  LUNGS:  Clear to auscultation bilaterally. On room air.  ABDOMEN:  Soft, round, nontender, positive bowel sounds x4.     LVAD exit site:   Dressing dry and intact.  No erythema or drainage.  Stabilization device present and accurately applied.  Driveline dressing is being changed daily per sterile technique. EXTREMITIES:  Warm and dry, no cyanosis, clubbing, rash. Rand LLE trace-1+ edema Ted hose on bilaterally NEUROLOGIC:  Alert and oriented x 4.  Gait steady.  No aphasia.  No dysarthria.  Affect pleasant.     Telemetry: Personally reviewed NSR 80s   Labs: Basic Metabolic Panel:  Recent Labs Lab 06/03/16 0411   06/04/16 0446  06/05/16 [id number] 06/06/16 0016 06/07/16 0238 06/08/16 0207 06/09/16 0341  NA 133*  < > 134*  < > 133* 132* 131* 132* 130*  K 2.7*  < > 3.1*  < > 4.1 4.3 4.1 3.9 3.8  CL 96*  < > 94*  < > 99* 97* 97* 93* 93*  CO2 29  --  30  --  29 28 26 28 28   GLUCOSE 118*  < > 62*  < > 60* 127* 132* 96 187*  BUN 11  < > 9  < > 9 10 8 10 13   CREATININE 0.71  < > 0.61  < > 0.51* 0.69 0.63 0.71 0.81  CALCIUM 8.0*  --  7.8*  --  8.0* 8.1* 8.1* 8.3* 8.0*  MG 1.7  --  2.0  --  2.1  --   --   --   --   < > = values in this interval not displayed.  Liver Function Tests: No results for input(s): AST, ALT, ALKPHOS, BILITOT, PROT, ALBUMIN in the last 168 hours. No results for input(s): LIPASE, AMYLASE in the last 168 hours.  No results for input(s): AMMONIA in the last 168 hours.  CBC:  Recent Labs Lab 06/05/16 0624 06/06/16 0016 06/07/16 0238 06/08/16 0207 06/09/16 0341  WBC 6.2 7.1 8.7 8.4 7.2  NEUTROABS 4.4 5.2 6.7 6.4 5.6  HGB 7.8* 7.6* 8.5* 8.7* 8.4*  HCT 25.0* 24.4* 26.9* 26.8* 26.6*  MCV 88.0 88.7 86.5 87.6 88.7  PLT 203 193 213 248 263    INR:  Recent Labs Lab 06/05/16 0624 06/06/16 0016 06/07/16 0238 06/08/16 0207 06/09/16 0341  INR 1.53 1.52 1.60 1.53 1.64    Other results:   Imaging: No results found.   Medications:     Scheduled Medications: . amiodarone  200 mg Oral BID  . aspirin EC  325 mg Oral Daily   Or  . aspirin  324 mg Per Tube Daily   Or  . aspirin  300 mg Rectal Daily  . bisacodyl  10 mg Oral Daily   Or  . bisacodyl  10 mg Rectal Daily  . docusate sodium  200 mg Oral Daily  . ferrous fumarate-b12-vitamic C-folic acid  1 capsule Oral BID BM  . furosemide  80 mg Intravenous BID  . guaiFENesin  600 mg Oral BID  . insulin aspart  0-15 Units Subcutaneous TID WC  . insulin aspart  0-5 Units Subcutaneous QHS  . insulin detemir  18 Units Subcutaneous Daily  . losartan  25 mg Oral BID  . mouth rinse  15 mL Mouth Rinse BID  . oxymetazoline   1 spray Each Nare BID  . pantoprazole  40 mg Oral Daily  . potassium chloride  40 mEq Oral BID  . sildenafil  20 mg Oral TID  . sodium chloride flush  3 mL Intravenous Q12H  . spironolactone  25 mg Oral Daily  . Warfarin - Pharmacist Dosing Inpatient   Does not apply q1800    Infusions: . sodium chloride    . sodium chloride    . sodium chloride 10 mL/hr at 06/07/16 1300  . heparin 600 Units/hr (06/07/16 1716)    PRN Medications: hydrALAZINE, ondansetron (ZOFRAN) IV, oxyCODONE, sodium chloride flush, traMADol   Assessment/Plan/Discussion    1. Acute on chronic systolic CHF: Ischemic cardiomyopathy.  St Jude ICD.  05/26/16 ECHO EF 20-25%.   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 Milrinone stopped 6/3, co-ox 63% afterwards.  Looks great.  - Volume status improved. Weight up 1 pound. He is weighing with equipment. Continue Lasix 80 mg IV bid one more day.  - MAP controlled. Continue current dose of losartan and spironolactone.   - Continue 324 mg aspirin + coumadin. INR 1.6. Continue heparin drip until INR > 1.8. Pharmacy dosing coumadin. .  - Ramp ECHO later today.  3. R Pleural Effusion:  R PleurX catheter. D/C when ok with  Dr Maren BeachVantrigt. 4. CAD: 3V CAD S/P stent Prox LAD and PTCA to subtotally occluded OMI 01/2016  5. Pulmonary HTN: PA catheter out.  PA pressures were lower post-LVAD.  - Pre-op, he was on Revatio 20 mg tid.  Continue Revatio 20 mg tid.  6. Former Smoker.   7. HTN:  Maps 70s . Continue current regimen. No change.   8. Anemia: Got 1u PRBCs on 6/1 and again on 6/5.  No obvious bleeding. Continue protonix. Today's Hgb 8.4.   9. DM2: Insulin. Has been hypoglycemic in ams. Insulin adjusted 06/07/16.  10. Hyponatrmia- Sodium 130. BMET in am. Can restrict water.   Anticipate D/C Monday. Does not need HHPT. Continue HVAD education.  I reviewed the HVAD parameters from today, and compared the results to the patient's prior recorded data.  No programming changes were made.  The HVAD is  functioning within specified parameters.    Length of Stay: 16  [name], NP  06/09/2016, 7:42 AM  VAD Team --- VAD ISSUES ONLY--- Pager [phone] (7am - 7am)  Patient seen with NP, agree with the above note.  Still some volume overload on exam, would continue IV Lasix today.    Doing well on his walks, continue ambulation.   INR still subtherapeutic, continue heparin gtt until INR > 1.8.  PleurX out when off heparin.   Ramp echo today to optimize speed.   MAP stable.   Probably home Monday.   Marca Ancona 06/09/2016 8:42 AM

## 2016-06-09 NOTE — Addendum Note (Signed)
Addendum  created 06/09/16 1203 by Devany Aja, MD   Sign clinical note    

## 2016-06-09 NOTE — Progress Notes (Signed)
Performed daily drive line exit site dressing with Dannielle HuhDanny, patients father, at bedside. Dannielle HuhDanny showed improved performance but continue to need reinforcement with placing sterile gloves. Will speak with nursing staff to provide education over the weekend.   Marcellus ScottLesley Wilson RN, VAD Coordinator 24/7 pager [phone]

## 2016-06-09 NOTE — Progress Notes (Signed)
LVAD Coordinator Inpatient Rounding Note:  [employer] LVAD implanted on 5/29/2018by Tharon Aquas Trigt.  Vital signs: HR: 81 Doppler Pressure: 74 Automatic BP: not done O2 Sat: 98on RA Wt in lbs: 200 > 216> 230>227>227>225>226   LVAD interrogation reveals:  Speed: 2800 Flow: 5.3 Power: 5 Peak /Trough: 7/4.3 Alarms:none   Suction alarm ON  Alarm settings: Low flow: 3.0 High Power: 7.0   Drive Line: Daily using daily dressing kit with aquacel silver strips on exit site.  Gtts:  none  Blood products: 1 PC and 4 Plasma - in OR 05/30/16 1 unit PRBC- 06/06/16  ICD: St Jude single lead Therapy ON  Labs:  LDH trend: 234>237>225>231>228>242 >236>226  INR trend: 1.36 > 1.19>1.19>1.21>1.53>1.52>1.6>1.53>1.64  Anticoagulation Plan: -INR Goal: 2-2.5 -ASA Dose: 325 (started 05/31/16)  Adverse Events on VAD: -none  Plan/Recommendations:   1. Daily dressing changes. Please continue to assist patients father with dressing changes. 2. Planned removal of Pleurx catheter after heparin gtt is off 3.Home VAD equipment ordered; possible discharge home Monday 06/12/16.  Balinda Quails RN, VAD Coordinator 24/7 pager [phone]

## 2016-06-10 LAB — BASIC METABOLIC PANEL
Anion gap: 8 (ref 5–15)
BUN: 12 mg/dL (ref 6–20)
CALCIUM: 8.3 mg/dL — AB (ref 8.9–10.3)
CO2: 28 mmol/L (ref 22–32)
Chloride: 97 mmol/L — ABNORMAL LOW (ref 101–111)
Creatinine, Ser: 0.74 mg/dL (ref 0.61–1.24)
GFR calc non Af Amer: >60 mL/min (ref >60)
Glucose, Bld: 110 mg/dL — ABNORMAL HIGH (ref 65–99)
POTASSIUM: 3.9 mmol/L (ref 3.5–5.1)
SODIUM: 133 mmol/L — AB (ref 135–145)

## 2016-06-10 LAB — CBC WITH DIFFERENTIAL/PLATELET
Basophils Absolute: 0 10*3/uL (ref 0.0–0.1)
Basophils Relative: 0 %
Eosinophils Absolute: 0.3 10*3/uL (ref 0.0–0.7)
Eosinophils Relative: 4 %
HCT: 27.3 % — ABNORMAL LOW (ref 39.0–52.0)
Hemoglobin: 8.6 g/dL — ABNORMAL LOW (ref 13.0–17.0)
Lymphocytes Relative: 17 %
Lymphs Abs: 1.1 10*3/uL (ref 0.7–4.0)
MCH: 28 pg (ref 26.0–34.0)
MCHC: 31.5 g/dL (ref 30.0–36.0)
MCV: 88.9 fL (ref 78.0–100.0)
Monocytes Absolute: 0.4 10*3/uL (ref 0.1–1.0)
Monocytes Relative: 6 %
Neutro Abs: 4.7 10*3/uL (ref 1.7–7.7)
Neutrophils Relative %: 73 %
Platelets: 270 10*3/uL (ref 150–400)
RBC: 3.07 MIL/uL — ABNORMAL LOW (ref 4.22–5.81)
RDW: 17.8 % — ABNORMAL HIGH (ref 11.5–15.5)
WBC: 6.5 10*3/uL (ref 4.0–10.5)

## 2016-06-10 LAB — GLUCOSE, CAPILLARY
GLUCOSE-CAPILLARY: 86 mg/dL (ref 65–99)
GLUCOSE-CAPILLARY: 238 mg/dL — AB (ref 65–99)
GLUCOSE-CAPILLARY: 257 mg/dL — AB (ref 65–99)
Glucose-Capillary: 164 mg/dL — ABNORMAL HIGH (ref 65–99)

## 2016-06-10 LAB — LACTATE DEHYDROGENASE: LDH: 215 U/L — ABNORMAL HIGH (ref 98–192)

## 2016-06-10 LAB — PROTIME-INR
INR: 1.61
Prothrombin Time: 19.3 seconds — ABNORMAL HIGH (ref 11.4–15.2)

## 2016-06-10 MED ORDER — WARFARIN SODIUM 2 MG PO TABS
12.0000 mg | ORAL_TABLET | Freq: Once | ORAL | Status: AC
  Administered 2016-06-10: 12 mg via ORAL
  Filled 2016-06-10: qty 1

## 2016-06-10 NOTE — Progress Notes (Signed)
Patient ID: [name], male   DOB: 1955/04/12, 61 y.o.   MRN: [id number]  HVAD Rounding Note  Subjective:    Feels well overall. Walked this am without shortness of breath. Nosebleed this am that has not stopped yet, not massive but has a cup of tissues with blood on them.   LVAD INTERROGATION:  HVAD:  Flow 5.1 liters/min, speed 3000, power 5.8  Objective:    Vital Signs:   Temp:  [97.9 F (36.6 C)-98.6 F (37 C)] 97.9 F (36.6 C) (06/09 0500) SpO2:  [92 %-100 %] 92 % (06/09 0500) Last BM Date: 06/08/16 Mean arterial Pressure 70's  Intake/Output:   Intake/Output Summary (Last 24 hours) at 06/10/16 1045 Last data filed at 06/10/16 0800  Gross per 24 hour  Intake             1080 ml  Output             2475 ml  Net            -1395 ml     Physical Exam: General:  Well appearing. No resp difficulty HEENT: tissue stuffed in left nares Cor: normal heart sounds with LVAD hum present. Lungs: clear Chest incisions healing well Abdomen: soft, nontender, nondistended. Good bowel sounds. Extremities: mild bilateral leg edema Neuro: alert & oriented,  Affect pleasant  Telemetry: sinus 80's  Labs: Basic Metabolic Panel:  Recent Labs Lab 06/04/16 0446  06/05/16 [id number] 06/06/16 0016 06/07/16 0238 06/08/16 0207 06/09/16 0341 06/10/16 0338  NA 134*  < > 133* 132* 131* 132* 130* 133*  K 3.1*  < > 4.1 4.3 4.1 3.9 3.8 3.9  CL 94*  < > 99* 97* 97* 93* 93* 97*  CO2 30  --  29 28 26 28 28 28   GLUCOSE 62*  < > 60* 127* 132* 96 187* 110*  BUN 9  < > 9 10 8 10 13 12   CREATININE 0.61  < > 0.51* 0.69 0.63 0.71 0.81 0.74  CALCIUM 7.8*  --  8.0* 8.1* 8.1* 8.3* 8.0* 8.3*  MG 2.0  --  2.1  --   --   --   --   --   < > = values in this interval not displayed.  Liver Function Tests: No results for input(s): AST, ALT, ALKPHOS, BILITOT, PROT, ALBUMIN in the last 168 hours. No results for input(s): LIPASE, AMYLASE in the last 168 hours. No results for input(s): AMMONIA in the last  168 hours.  CBC:  Recent Labs Lab 06/06/16 0016 06/07/16 0238 06/08/16 0207 06/09/16 0341 06/10/16 0338  WBC 7.1 8.7 8.4 7.2 6.5  NEUTROABS 5.2 6.7 6.4 5.6 4.7  HGB 7.6* 8.5* 8.7* 8.4* 8.6*  HCT 24.4* 26.9* 26.8* 26.6* 27.3*  MCV 88.7 86.5 87.6 88.7 88.9  PLT 193 213 248 263 270    INR:  Recent Labs Lab 06/06/16 0016 06/07/16 0238 06/08/16 0207 06/09/16 0341 06/10/16 0338  INR 1.52 1.60 1.53 1.64 1.61    Other results:  EKG:   Imaging:  No results found.   Medications:     Scheduled Medications: . amiodarone  200 mg Oral BID  . aspirin EC  325 mg Oral Daily   Or  . aspirin  324 mg Per Tube Daily   Or  . aspirin  300 mg Rectal Daily  . bisacodyl  10 mg Oral Daily   Or  . bisacodyl  10 mg Rectal Daily  . docusate sodium  200 mg  Oral Daily  . ferrous fumarate-b12-vitamic C-folic acid  1 capsule Oral BID BM  . furosemide  80 mg Intravenous BID  . guaiFENesin  600 mg Oral BID  . insulin aspart  0-15 Units Subcutaneous TID WC  . insulin aspart  0-5 Units Subcutaneous QHS  . insulin detemir  18 Units Subcutaneous Daily  . losartan  25 mg Oral BID  . mouth rinse  15 mL Mouth Rinse BID  . oxymetazoline  1 spray Each Nare BID  . pantoprazole  40 mg Oral Daily  . potassium chloride  40 mEq Oral BID  . sildenafil  20 mg Oral TID  . sodium chloride flush  3 mL Intravenous Q12H  . spironolactone  25 mg Oral Daily  . warfarin  12 mg Oral ONCE-1800  . Warfarin - Pharmacist Dosing Inpatient   Does not apply q1800     Infusions: . sodium chloride    . sodium chloride    . sodium chloride 10 mL/hr at 06/07/16 1300  . heparin 600 Units/hr (06/07/16 1716)     PRN Medications:  hydrALAZINE, ondansetron (ZOFRAN) IV, oxyCODONE, sodium chloride flush, traMADol   Assessment/Plan/Discussion:    POD 11 s/p HVAD for acute on chronic systolic heart failure due to ischemic CM. Speed increased to 3000 yesterday after Ramp. The peak to trough flow is fairly flat  now. MAP well controlled.  INR still sub-therapeutic at 1.6 after two doses of 10 mg Coumadin. Pharmacy is managing. He is on heparin but having nosebleed this am so it will have to be stopped for a while. LDH has been stable in low 200 range.  PVT plans to remove PleurX catheter and pacing wires Monday when heparin off.   Length of Stay: [address]  Payton Doughty [facility] 06/10/2016, 10:45 AM

## 2016-06-10 NOTE — Consult Note (Signed)
Palliative care meeting was done this morning for delineation of goals of care and quality-of-life preferences. Full consult note to follow.   Anderson MaltaElizabeth Kadarius Cuffe, DO Palliative Medicine [phone]

## 2016-06-10 NOTE — Progress Notes (Signed)
Pt walked 67400ft.  x2 tolarated well

## 2016-06-10 NOTE — Progress Notes (Signed)
ANTICOAGULATION CONSULT NOTE - Follow Up Consult  Pharmacy Consult for Warfarin / Heparin (MD dosing) Indication: HVAD  Allergies  Allergen Reactions  . Metformin And Related Nausea And Vomiting    *Only the extended release* (does this)  . Niacin And Related Other (See Comments)    REACTION IS SIDE EFFECT "SEVERE" FLUSHING    Patient Measurements: Height: 6' (182.9 cm) Weight: 226 lb 4.8 oz (102.6 kg) IBW/kg (Calculated) : 77.6   Vital Signs: Temp: 97.9 F (36.6 C) (06/09 0500) Temp Source: Oral (06/09 0500)  Labs:  Recent Labs  06/08/16 0207 06/09/16 0341 06/10/16 0338  HGB 8.7* 8.4* 8.6*  HCT 26.8* 26.6* 27.3*  PLT 248 263 270  LABPROT 18.5* 19.6* 19.3*  INR 1.53 1.64 1.61  HEPARINUNFRC <0.10*  --   --   CREATININE 0.71 0.81 0.74    Estimated Creatinine Clearance: 120.1 mL/min (by C-G formula based on SCr of 0.74 mg/dL).  Medications: Heparin @ 600 units/hr  Assessment: 60yom with CAD- PCI LAD 1/18 on plavix PTA held for LVAD implant - Heartware 05/30/2016.   Warfarin started 5/30 post op day #1. INR remained low and heparin bridge added 6/3.   INR flattened overnight 1.61, will give extra warfarin today. Heparin level has been subtherapeutic on 600 units/hr, so daily levels were d/c'd. Plan to stop heparin once INR >1.8.  Hgb 8.6. LDH stable. No further nosebleeds noted.   Goal of Therapy:  INR 2-2.5 Monitor platelets by anticoagulation protocol: Yes   Plan:  1) Warfarin 12 mg tonight 2) Continue heparin at 600 units/hr per MD dosing 3) Daily  INR, CBC, LDH.  Sheppard CoilFrank Wilson PharmD., BCPS Clinical Pharmacist Pager [phone] 06/10/2016 8:26 AM

## 2016-06-10 NOTE — Progress Notes (Signed)
Patient ID: [name], male   DOB: 1955-02-20, 61 y.o.   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    Transfused 1 unit PRBCs 06/06/16  IV Lasix continued yesterday, reasonable diuresis.  Breathing stable, walking in hall on his own.   Ramp echo (6/8) with increase speed to 3000 rpm.    Nosebleed again today.   Hgb 8.7> 8.4>8.6  LDH 236>226>215  LVAD INTERROGATION:  HVAD:  Flow  5.1 liters/min, RPM 3000   5.8 Watts  Peak 6.8, trough 5   Objective:    Vital Signs:   Temp:  [97.9 F (36.6 C)-98.6 F (37 C)] 97.9 F (36.6 C) (06/09 0500) SpO2:  [92 %-100 %] 92 % (06/09 0500) Last BM Date: 06/08/16 Mean arterial Pressure 70s  Intake/Output:   Intake/Output Summary (Last 24 hours) at 06/10/16 1031 Last data filed at 06/10/16 0800  Gross per 24 hour  Intake             1080 ml  Output             2475 ml  Net            -1395 ml     Physical Exam: GENERAL: NAD Sitting on the side of the bed.  HEENT: normal  NECK: Supple, JVP 8.  2+ bilaterally, no bruits.  No lymphadenopathy or thyromegaly appreciated.   CARDIAC:  Mechanical heart sounds with LVAD hum present.  LUNGS:  Clear to auscultation bilaterally. On room air.  ABDOMEN:  Soft, round, nontender, positive bowel sounds x4.     LVAD exit site:   Dressing dry and intact.  No erythema or drainage.  Stabilization device present and accurately applied.  Driveline dressing is being changed daily per sterile technique. EXTREMITIES:  Warm and dry, no cyanosis, clubbing, rash. 1+ edema 1/2 to knees bilaterally. Ted hose on bilaterally NEUROLOGIC:  Alert and oriented x 4.  Gait steady.  No aphasia.  No dysarthria.  Affect pleasant.     Telemetry: Personally reviewed NSR 80s   Labs: Basic Metabolic Panel:  Recent Labs Lab 06/04/16 0446  06/05/16 4098 06/06/16 0016 06/07/16 0238 06/08/16 0207 06/09/16 0341  06/10/16 0338  NA 134*  < > 133* 132* 131* 132* 130* 133*  K 3.1*  < > 4.1 4.3 4.1 3.9 3.8 3.9  CL 94*  < > 99* 97* 97* 93* 93* 97*  CO2 30  --  29 28 26 28 28 28   GLUCOSE 62*  < > 60* 127* 132* 96 187* 110*  BUN 9  < > 9 10 8 10 13 12   CREATININE 0.61  < > 0.51* 0.69 0.63 0.71 0.81 0.74  CALCIUM 7.8*  --  8.0* 8.1* 8.1* 8.3* 8.0* 8.3*  MG 2.0  --  2.1  --   --   --   --   --   < > = values in this interval not displayed.  Liver Function Tests: No results for input(s): AST, ALT, ALKPHOS, BILITOT, PROT, ALBUMIN in the last 168 hours. No results for input(s): LIPASE, AMYLASE in the last 168 hours. No results for input(s): AMMONIA in the last 168 hours.  CBC:  Recent Labs Lab 06/06/16 0016 06/07/16 0238 06/08/16 0207 06/09/16 0341 06/10/16 0338  WBC 7.1 8.7 8.4 7.2 6.5  NEUTROABS 5.2 6.7 6.4 5.6 4.7  HGB 7.6* 8.5* 8.7* 8.4* 8.6*  HCT 24.4* 26.9* 26.8* 26.6* 27.3*  MCV 88.7 86.5 87.6 88.7 88.9  PLT 193 213 248 263 270    INR:  Recent Labs Lab 06/06/16 0016 06/07/16 0238 06/08/16 0207 06/09/16 0341 06/10/16 0338  INR 1.52 1.60 1.53 1.64 1.61    Other results:   Imaging: No results found.   Medications:     Scheduled Medications: . amiodarone  200 mg Oral BID  . aspirin EC  325 mg Oral Daily   Or  . aspirin  324 mg Per Tube Daily   Or  . aspirin  300 mg Rectal Daily  . bisacodyl  10 mg Oral Daily   Or  . bisacodyl  10 mg Rectal Daily  . docusate sodium  200 mg Oral Daily  . ferrous fumarate-b12-vitamic C-folic acid  1 capsule Oral BID BM  . furosemide  80 mg Intravenous BID  . guaiFENesin  600 mg Oral BID  . insulin aspart  0-15 Units Subcutaneous TID WC  . insulin aspart  0-5 Units Subcutaneous QHS  . insulin detemir  18 Units Subcutaneous Daily  . losartan  25 mg Oral BID  . mouth rinse  15 mL Mouth Rinse BID  . oxymetazoline  1 spray Each Nare BID  . pantoprazole  40 mg Oral Daily  . potassium chloride  40 mEq Oral BID  . sildenafil  20 mg  Oral TID  . sodium chloride flush  3 mL Intravenous Q12H  . spironolactone  25 mg Oral Daily  . warfarin  12 mg Oral ONCE-1800  . Warfarin - Pharmacist Dosing Inpatient   Does not apply q1800    Infusions: . sodium chloride    . sodium chloride    . sodium chloride 10 mL/hr at 06/07/16 1300  . heparin 600 Units/hr (06/07/16 1716)    PRN Medications: hydrALAZINE, ondansetron (ZOFRAN) IV, oxyCODONE, sodium chloride flush, traMADol   Assessment/Plan/Discussion    1. Acute on chronic systolic CHF: Ischemic cardiomyopathy.  St Jude ICD.  05/26/16 ECHO EF 20-25%.   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 Milrinone stopped 6/3, co-ox 63% afterwards.  Speed increased to 3000 rpm yesterday based on ramp echo.  Seems to be doing ok since then.  Slight narrowing of peak-to-trough, will need to keep an eye on this.  He does still appear on exam to have some volume overload.  - Continue Lasix 80 mg IV bid one more day, likely to po tomorrow.  - MAP controlled. Continue current dose of losartan and spironolactone.   - Continue 324 mg aspirin + coumadin. INR 1.6. Continue heparin drip until INR > 1.8 (will hold heparin gtt x 2 hours today with nose bleed).    3. R Pleural Effusion:  R PleurX catheter. D/C when ok with Dr Donata ClayVan Trigt. 4. CAD: 3V CAD S/P stent Prox LAD and PTCA to subtotally occluded OMI 01/2016  5. Pulmonary HTN: PA catheter out.  PA pressures were lower post-LVAD.  - Pre-op, he was on Revatio 20 mg tid.  Continue Revatio 20 mg tid.  6. Former Smoker.   7. HTN:  Maps 70s . Continue current regimen. No change.   8. Anemia: Got 1u PRBCs on 6/1 and again on 6/5.  No obvious bleeding. Continue protonix. Today's Hgb 8.6.   9. DM2: Insulin. Has been hypoglycemic in ams. Insulin adjusted 06/07/16.  10. Hyponatremia: Improving.   Anticipate D/C Monday or Tuesday. Does not need HHPT. Continue HVAD education.   I  reviewed the HVAD parameters from today, and compared the results to the patient's prior recorded data.   No programming changes were made.  The HVAD is functioning within specified parameters.    Length of Stay: 43  Marca Ancona, MD  06/10/2016, 10:31 AM  VAD Team --- VAD ISSUES ONLY--- Pager [phone] (7am - 7am)

## 2016-06-11 LAB — CBC WITH DIFFERENTIAL/PLATELET
Basophils Absolute: 0 10*3/uL (ref 0.0–0.1)
Basophils Relative: 0 %
Eosinophils Absolute: 0.3 10*3/uL (ref 0.0–0.7)
Eosinophils Relative: 4 %
HCT: 28.1 % — ABNORMAL LOW (ref 39.0–52.0)
Hemoglobin: 8.7 g/dL — ABNORMAL LOW (ref 13.0–17.0)
Lymphocytes Relative: 16 %
Lymphs Abs: 1.2 10*3/uL (ref 0.7–4.0)
MCH: 27.7 pg (ref 26.0–34.0)
MCHC: 31 g/dL (ref 30.0–36.0)
MCV: 89.5 fL (ref 78.0–100.0)
Monocytes Absolute: 0.5 10*3/uL (ref 0.1–1.0)
Monocytes Relative: 7 %
Neutro Abs: 5.2 10*3/uL (ref 1.7–7.7)
Neutrophils Relative %: 73 %
Platelets: 308 10*3/uL (ref 150–400)
RBC: 3.14 MIL/uL — ABNORMAL LOW (ref 4.22–5.81)
RDW: 17.7 % — ABNORMAL HIGH (ref 11.5–15.5)
WBC: 7.2 10*3/uL (ref 4.0–10.5)

## 2016-06-11 LAB — GLUCOSE, CAPILLARY
Glucose-Capillary: 117 mg/dL — ABNORMAL HIGH (ref 65–99)
Glucose-Capillary: 256 mg/dL — ABNORMAL HIGH (ref 65–99)
Glucose-Capillary: 254 mg/dL — ABNORMAL HIGH (ref 65–99)
Glucose-Capillary: 159 mg/dL — ABNORMAL HIGH (ref 65–99)

## 2016-06-11 LAB — PROTIME-INR
INR: 1.71
Prothrombin Time: 20.3 seconds — ABNORMAL HIGH (ref 11.4–15.2)

## 2016-06-11 LAB — BASIC METABOLIC PANEL
Anion gap: 6 (ref 5–15)
BUN: 10 mg/dL (ref 6–20)
CO2: 30 mmol/L (ref 22–32)
Calcium: 8.4 mg/dL — ABNORMAL LOW (ref 8.9–10.3)
Chloride: 98 mmol/L — ABNORMAL LOW (ref 101–111)
Creatinine, Ser: 0.75 mg/dL (ref 0.61–1.24)
GFR calc Af Amer: >60 mL/min (ref >60)
GFR calc non Af Amer: >60 mL/min (ref >60)
Glucose, Bld: 97 mg/dL (ref 65–99)
Potassium: 4 mmol/L (ref 3.5–5.1)
Sodium: 134 mmol/L — ABNORMAL LOW (ref 135–145)

## 2016-06-11 LAB — LACTATE DEHYDROGENASE: LDH: 207 U/L — ABNORMAL HIGH (ref 98–192)

## 2016-06-11 MED ORDER — WARFARIN SODIUM 2.5 MG PO TABS
12.5000 mg | ORAL_TABLET | Freq: Once | ORAL | Status: AC
  Administered 2016-06-11: 12.5 mg via ORAL
  Filled 2016-06-11: qty 1

## 2016-06-11 NOTE — Progress Notes (Signed)
Pt walked 600 ft. On room air tolerated well

## 2016-06-11 NOTE — Progress Notes (Signed)
Patient ID: Dalton RainwaterLarry D Vanevery, male   DOB: 1955/09/30, 61 y.o.   MRN: [id number] HVAD  Rounding Note  Subjective:    Had nose bleed yesterday and heparin stopped for a while. Otherwise feels ok. Ambulating without shortness of breath. Pain well-controlled.   LVAD INTERROGATION:  HeartMate II LVAD:  Flow 5.8 liters/min, speed 3000, power 6  Objective:    Vital Signs:   Temp:  [98.4 F (36.9 C)] 98.4 F (36.9 C) (06/09 2000) Weight:  [100.5 kg (221 lb 9.6 oz)] 100.5 kg (221 lb 9.6 oz) (06/10 0600) Last BM Date: 06/10/16 Mean arterial Pressure 70's  Intake/Output:   Intake/Output Summary (Last 24 hours) at 06/11/16 1137 Last data filed at 06/11/16 0845  Gross per 24 hour  Intake              840 ml  Output             2450 ml  Net            -1610 ml     Physical Exam: General:  Well appearing. No resp difficulty HEENT: normal Cor: Normal heart sounds with LVAD hum present. Lungs: clear Abdomen: soft, nontender, nondistended. Good bowel sounds. Extremities: mild leg edema Neuro: alert & orientedx3,  Affect pleasant  Telemetry: sinus 80's  Labs: Basic Metabolic Panel:  Recent Labs Lab 06/05/16 0624  06/07/16 0238 06/08/16 0207 06/09/16 0341 06/10/16 0338 06/11/16 0346  NA 133*  < > 131* 132* 130* 133* 134*  K 4.1  < > 4.1 3.9 3.8 3.9 4.0  CL 99*  < > 97* 93* 93* 97* 98*  CO2 29  < > 26 28 28 28 30   GLUCOSE 60*  < > 132* 96 187* 110* 97  BUN 9  < > 8 10 13 12 10   CREATININE 0.51*  < > 0.63 0.71 0.81 0.74 0.75  CALCIUM 8.0*  < > 8.1* 8.3* 8.0* 8.3* 8.4*  MG 2.1  --   --   --   --   --   --   < > = values in this interval not displayed.  Liver Function Tests: No results for input(s): AST, ALT, ALKPHOS, BILITOT, PROT, ALBUMIN in the last 168 hours. No results for input(s): LIPASE, AMYLASE in the last 168 hours. No results for input(s): AMMONIA in the last 168 hours.  CBC:  Recent Labs Lab 06/07/16 0238 06/08/16 0207 06/09/16 0341 06/10/16 0338  06/11/16 0346  WBC 8.7 8.4 7.2 6.5 7.2  NEUTROABS 6.7 6.4 5.6 4.7 5.2  HGB 8.5* 8.7* 8.4* 8.6* 8.7*  HCT 26.9* 26.8* 26.6* 27.3* 28.1*  MCV 86.5 87.6 88.7 88.9 89.5  PLT 213 248 263 270 308    INR:  Recent Labs Lab 06/07/16 0238 06/08/16 0207 06/09/16 0341 06/10/16 0338 06/11/16 0346  INR 1.60 1.53 1.64 1.61 1.71   LDH 207  Other results:  EKG:   Imaging:  No results found.   Medications:     Scheduled Medications: . amiodarone  200 mg Oral BID  . aspirin EC  325 mg Oral Daily   Or  . aspirin  324 mg Per Tube Daily   Or  . aspirin  300 mg Rectal Daily  . bisacodyl  10 mg Oral Daily   Or  . bisacodyl  10 mg Rectal Daily  . docusate sodium  200 mg Oral Daily  . ferrous fumarate-b12-vitamic C-folic acid  1 capsule Oral BID BM  . furosemide  80 mg Intravenous  BID  . guaiFENesin  600 mg Oral BID  . insulin aspart  0-15 Units Subcutaneous TID WC  . insulin aspart  0-5 Units Subcutaneous QHS  . insulin detemir  18 Units Subcutaneous Daily  . losartan  25 mg Oral BID  . mouth rinse  15 mL Mouth Rinse BID  . oxymetazoline  1 spray Each Nare BID  . pantoprazole  40 mg Oral Daily  . potassium chloride  40 mEq Oral BID  . sildenafil  20 mg Oral TID  . sodium chloride flush  3 mL Intravenous Q12H  . spironolactone  25 mg Oral Daily  . warfarin  12.5 mg Oral ONCE-1800  . Warfarin - Pharmacist Dosing Inpatient   Does not apply q1800     Infusions: . sodium chloride    . sodium chloride    . sodium chloride 10 mL/hr at 06/07/16 1300  . heparin 600 Units/hr (06/07/16 1716)     PRN Medications:  hydrALAZINE, ondansetron (ZOFRAN) IV, oxyCODONE, sodium chloride flush, traMADol   Assessment/Plan/Discussion:    POD 12 s/p HVAD for acute on chronic systolic heart failure due to ischemic CM. Hemodynamically stable.  INR slightly higher but still less than 2 so will continue heparin. Pharmacy is dosing Coumadin. LDH low 200's.  Continue mobilization and  teaching.  Plan removal of PleurX catheter and pacing wires tomorrow.   I reviewed the LVAD parameters from today, and compared the results to the patient's prior recorded data.  No programming changes were made.  The LVAD is functioning within specified parameters.  The patient performs LVAD self-test daily.  LVAD interrogation was negative for any significant power changes  or alarms LVAD equipment check completed and is in good working order.  Back-up equipment present.    Length of Stay: [address]  Payton Doughty [facility] 06/11/2016, 11:37 AM

## 2016-06-11 NOTE — Progress Notes (Signed)
ANTICOAGULATION CONSULT NOTE - Follow Up Consult  Pharmacy Consult for Warfarin / Heparin (MD dosing) Indication: HVAD  Allergies  Allergen Reactions  . Metformin And Related Nausea And Vomiting    *Only the extended release* (does this)  . Niacin And Related Other (See Comments)    REACTION IS SIDE EFFECT "SEVERE" FLUSHING    Patient Measurements: Height: 6' (182.9 cm) Weight: 221 lb 9.6 oz (100.5 kg) IBW/kg (Calculated) : 77.6   Vital Signs: Temp: 98.4 F (36.9 C) (06/09 2000) Temp Source: Oral (06/09 2000)  Labs:  Recent Labs  06/09/16 0341 06/10/16 0338 06/11/16 0346  HGB 8.4* 8.6* 8.7*  HCT 26.6* 27.3* 28.1*  PLT 263 270 308  LABPROT 19.6* 19.3* 20.3*  INR 1.64 1.61 1.71  CREATININE 0.81 0.74 0.75    Estimated Creatinine Clearance: 119 mL/min (by C-G formula based on SCr of 0.75 mg/dL).  Medications: Heparin @ 600 units/hr  Assessment: 60yom with CAD- PCI LAD 1/18 on plavix PTA held for LVAD implant - [employer] 05/30/2016.   Warfarin started 5/30 post op day #1. INR remained low and heparin bridge added 6/3.   INR small trend up overnight to 1.7, will give extra warfarin today. Heparin level has been subtherapeutic on 600 units/hr, so daily levels were d/c'd. Plan to stop heparin once INR >1.8.  Hgb 8s. LDH stable. No further nosebleeds noted.   Goal of Therapy:  INR 2-2.5 Monitor platelets by anticoagulation protocol: Yes   Plan:  1) Warfarin 12.5 mg tonight 2) Continue heparin at 600 units/hr per MD dosing 3) Daily  INR, CBC, LDH.  Sheppard CoilFrank Brocha Gilliam PharmD., BCPS Clinical Pharmacist Pager [phone] 06/11/2016 7:50 AM

## 2016-06-11 NOTE — Progress Notes (Signed)
Pt walked 600 ft. On room air tolerated well 

## 2016-06-11 NOTE — Progress Notes (Signed)
Patient ID: Dalton RainwaterLarry D [name], male   DOB: 1955-11-23, 61 y.o.   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    Transfused 1 unit PRBCs 06/06/16  Ramp echo (6/8) with increase speed to 3000 rpm.    Nose bleed yesterday.   Overall feeling ok. Wants to go home. Denies SOB. Walking over 600 feet.   LVAD INTERROGATION:  HVAD:  Flow  5.6 liters/min, RPM 3000   5.8 Watts  Peak 7.1, trough 4.8   Objective:    Vital Signs:   Temp:  [98.4 F (36.9 C)] 98.4 F (36.9 C) (06/09 2000) Weight:  [221 lb 9.6 oz (100.5 kg)-222 lb 14.2 oz (101.1 kg)] 221 lb 9.6 oz (100.5 kg) (06/10 0600) Last BM Date: 06/10/16 Mean arterial Pressure 70-80s  Intake/Output:   Intake/Output Summary (Last 24 hours) at 06/11/16 0757 Last data filed at 06/11/16 0647  Gross per 24 hour  Intake              840 ml  Output             2950 ml  Net            -2110 ml      Physical Exam: GENERAL: Well appearing, male who presents to clinic today in no acute distress. HEENT: normal  NECK: Supple, JVP  .  2+ bilaterally, no bruits.  No lymphadenopathy or thyromegaly appreciated.   CARDIAC:  Mechanical heart sounds with LVAD hum present. Sternal incision approximated LUNGS:  Clear to auscultation bilaterally. On room air.  ABDOMEN:  Soft, round, nontender, positive bowel sounds x4.     LVAD exit site: well-healed and incorporated.  Dressing dry and intact.  No erythema or drainage.  Stabilization device present and accurately applied.  Driveline dressing is being changed daily per sterile technique. EXTREMITIES:  Warm and dry, no cyanosis, clubbing, rash. R and LLE 1+ edema with ted hose on bilaterally  or edema  NEUROLOGIC:  Alert and oriented x 4.  Gait steady.  No aphasia.  No dysarthria.  Affect pleasant.         Telemetry: Personally reviewed NSR 70-80s with PVCs.    Labs: Basic Metabolic Panel:  Recent  Labs Lab 06/05/16 0624  06/07/16 0238 06/08/16 0207 06/09/16 0341 06/10/16 0338 06/11/16 0346  NA 133*  < > 131* 132* 130* 133* 134*  K 4.1  < > 4.1 3.9 3.8 3.9 4.0  CL 99*  < > 97* 93* 93* 97* 98*  CO2 29  < > 26 28 28 28 30   GLUCOSE 60*  < > 132* 96 187* 110* 97  BUN 9  < > 8 10 13 12 10   CREATININE 0.51*  < > 0.63 0.71 0.81 0.74 0.75  CALCIUM 8.0*  < > 8.1* 8.3* 8.0* 8.3* 8.4*  MG 2.1  --   --   --   --   --   --   < > = values in this interval not displayed.  Liver Function Tests: No results for input(s): AST, ALT, ALKPHOS, BILITOT, PROT, ALBUMIN in the last 168 hours. No results for input(s): LIPASE, AMYLASE in the last 168 hours. No results for input(s): AMMONIA in the last 168 hours.  CBC:  Recent Labs Lab 06/07/16 0238 06/08/16 0207 06/09/16 0341 06/10/16 0338 06/11/16 0346  WBC 8.7 8.4 7.2  6.5 7.2  NEUTROABS 6.7 6.4 5.6 4.7 5.2  HGB 8.5* 8.7* 8.4* 8.6* 8.7*  HCT 26.9* 26.8* 26.6* 27.3* 28.1*  MCV 86.5 87.6 88.7 88.9 89.5  PLT 213 248 263 270 308    INR:  Recent Labs Lab 06/07/16 0238 06/08/16 0207 06/09/16 0341 06/10/16 0338 06/11/16 0346  INR 1.60 1.53 1.64 1.61 1.71    Other results:   Imaging: No results found.   Medications:     Scheduled Medications: . amiodarone  200 mg Oral BID  . aspirin EC  325 mg Oral Daily   Or  . aspirin  324 mg Per Tube Daily   Or  . aspirin  300 mg Rectal Daily  . bisacodyl  10 mg Oral Daily   Or  . bisacodyl  10 mg Rectal Daily  . docusate sodium  200 mg Oral Daily  . ferrous fumarate-b12-vitamic C-folic acid  1 capsule Oral BID BM  . furosemide  80 mg Intravenous BID  . guaiFENesin  600 mg Oral BID  . insulin aspart  0-15 Units Subcutaneous TID WC  . insulin aspart  0-5 Units Subcutaneous QHS  . insulin detemir  18 Units Subcutaneous Daily  . losartan  25 mg Oral BID  . mouth rinse  15 mL Mouth Rinse BID  . oxymetazoline  1 spray Each Nare BID  . pantoprazole  40 mg Oral Daily  . potassium  chloride  40 mEq Oral BID  . sildenafil  20 mg Oral TID  . sodium chloride flush  3 mL Intravenous Q12H  . spironolactone  25 mg Oral Daily  . warfarin  12.5 mg Oral ONCE-1800  . Warfarin - Pharmacist Dosing Inpatient   Does not apply q1800    Infusions: . sodium chloride    . sodium chloride    . sodium chloride 10 mL/hr at 06/07/16 1300  . heparin 600 Units/hr (06/07/16 1716)    PRN Medications: hydrALAZINE, ondansetron (ZOFRAN) IV, oxyCODONE, sodium chloride flush, traMADol   Assessment/Plan/Discussion    1. Acute on chronic systolic CHF: Ischemic cardiomyopathy.  St Jude ICD.  05/26/16 ECHO EF 20-25%.   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 Milrinone stopped 6/3, co-ox 63% afterwards.   Ramp ECHO on 6/8 with speed increased to 3000 .  Mild volume overload.  Give one more dose of IV lasix today. In am start 80 mg po lasix daily.  Map controlled. Continue losartan and spior at current dose. Renal function stable.    - Continue 324 mg aspirin + coumadin. INR 1.7, continue heparin drip until INR 1.8. Discussed with Pharm D.     3. R Pleural Effusion:  R PleurX catheter. D/C when ok with Dr Donata Clay. Possible d/c tomorrow.  4. CAD: 3V CAD S/P stent Prox LAD and PTCA to subtotally occluded OMI 01/2016  5. Pulmonary HTN: PA catheter out.  PA pressures were lower post-LVAD.  - Pre-op, he was on Revatio 20 mg tid.  Continue revatio 20 mg tid.   6. Former Smoker.   7. HTN:  Stable. Continue current regimen.  8. Anemia: Got 1u PRBCs on 6/1 and again on 6/5.  No obvious bleeding. Continue protonix. Todays Hgb 8.7    9. DM2: Insulin. -->. Insulin adjusted 06/07/16. Glucose ok.  10. Hyponatremia: Sodium 134.     Anticipate d/c 24-48 hours. Does not need HHPT. Continue HVAD education.   I reviewed the HVAD parameters from today, and compared the results to the patient's prior recorded data.  No programming  changes were made.  The HVAD is functioning within specified parameters.    Length of Stay: 66  Dalton Becket, NP  06/11/2016, 7:57 AM  VAD Team --- VAD ISSUES ONLY--- Pager [phone] (7am - 7am)  Patient examined chart reviewed Appreciate NP Amy Clegg's expertise and that of LVAD team He feels fine lungs clear auscultation with mechanical LVAD noise no AR AICD under left clavicle IV lasix today No edema. Wounds healing well Heparin drip until INR over 1.8 Right pleur X catheter Management per CVTS PVT  On revatio for pulmonary HTN.  LVAD parameters optimized with Ramp study on 06/09/16  Off milrinone    Charlton Haws

## 2016-06-12 ENCOUNTER — Encounter (HOSPITAL_COMMUNITY): Payer: BLUE CROSS/BLUE SHIELD

## 2016-06-12 ENCOUNTER — Encounter (HOSPITAL_COMMUNITY)
Admission: AD | Disposition: A | Discharge status: Home Health | Payer: Self-pay | Source: Ambulatory Visit | Attending: Cardiology

## 2016-06-12 DIAGNOSIS — I5023 Acute on chronic systolic (congestive) heart failure: Secondary | ICD-10-CM

## 2016-06-12 LAB — GLUCOSE, CAPILLARY
GLUCOSE-CAPILLARY: 133 mg/dL — AB (ref 65–99)
GLUCOSE-CAPILLARY: 243 mg/dL — AB (ref 65–99)
Glucose-Capillary: 155 mg/dL — ABNORMAL HIGH (ref 65–99)
Glucose-Capillary: 331 mg/dL — ABNORMAL HIGH (ref 65–99)

## 2016-06-12 LAB — BASIC METABOLIC PANEL
Anion gap: 7 (ref 5–15)
BUN: 12 mg/dL (ref 6–20)
CALCIUM: 8.3 mg/dL — AB (ref 8.9–10.3)
CO2: 29 mmol/L (ref 22–32)
CREATININE: 0.76 mg/dL (ref 0.61–1.24)
Chloride: 97 mmol/L — ABNORMAL LOW (ref 101–111)
GFR calc non Af Amer: >60 mL/min (ref >60)
Glucose, Bld: 188 mg/dL — ABNORMAL HIGH (ref 65–99)
Potassium: 4.6 mmol/L (ref 3.5–5.1)
SODIUM: 133 mmol/L — AB (ref 135–145)

## 2016-06-12 LAB — CBC WITH DIFFERENTIAL/PLATELET
Basophils Absolute: 0 10*3/uL (ref 0.0–0.1)
Basophils Relative: 0 %
Eosinophils Absolute: 0.2 10*3/uL (ref 0.0–0.7)
Eosinophils Relative: 3 %
HCT: 26.8 % — ABNORMAL LOW (ref 39.0–52.0)
Hemoglobin: 8.4 g/dL — ABNORMAL LOW (ref 13.0–17.0)
Lymphocytes Relative: 13 %
Lymphs Abs: 0.9 10*3/uL (ref 0.7–4.0)
MCH: 27.7 pg (ref 26.0–34.0)
MCHC: 31.3 g/dL (ref 30.0–36.0)
MCV: 88.4 fL (ref 78.0–100.0)
Monocytes Absolute: 0.5 10*3/uL (ref 0.1–1.0)
Monocytes Relative: 7 %
Neutro Abs: 5.4 10*3/uL (ref 1.7–7.7)
Neutrophils Relative %: 77 %
Platelets: 276 10*3/uL (ref 150–400)
RBC: 3.03 MIL/uL — ABNORMAL LOW (ref 4.22–5.81)
RDW: 17.4 % — ABNORMAL HIGH (ref 11.5–15.5)
WBC: 7 10*3/uL (ref 4.0–10.5)

## 2016-06-12 LAB — PROTIME-INR
INR: 1.89
Prothrombin Time: 22 seconds — ABNORMAL HIGH (ref 11.4–15.2)

## 2016-06-12 LAB — LACTATE DEHYDROGENASE: LDH: 210 U/L — ABNORMAL HIGH (ref 98–192)

## 2016-06-12 SURGERY — REMOVAL, CLOSED DRAINAGE CATHETER SYSTEM, PLEURAL
Anesthesia: LOCAL | Laterality: Right

## 2016-06-12 MED ORDER — LIDOCAINE HCL 1 % IJ SOLN
INTRAMUSCULAR | Status: AC
  Filled 2016-06-12: qty 20

## 2016-06-12 MED ORDER — WARFARIN SODIUM 10 MG PO TABS
12.5000 mg | ORAL_TABLET | Freq: Once | ORAL | Status: AC
  Administered 2016-06-12: 12.5 mg via ORAL
  Filled 2016-06-12: qty 1

## 2016-06-12 MED ORDER — SPIRONOLACTONE 25 MG PO TABS
25.0000 mg | ORAL_TABLET | Freq: Every day | ORAL | Status: DC
  Administered 2016-06-13: 25 mg via ORAL
  Filled 2016-06-12: qty 1

## 2016-06-12 MED ORDER — LOSARTAN POTASSIUM 25 MG PO TABS
25.0000 mg | ORAL_TABLET | Freq: Two times a day (BID) | ORAL | Status: DC
  Administered 2016-06-12 – 2016-06-13 (×2): 25 mg via ORAL
  Filled 2016-06-12 (×2): qty 1

## 2016-06-12 MED ORDER — SODIUM CHLORIDE 0.9 % IV BOLUS (SEPSIS)
250.0000 mL | Freq: Once | INTRAVENOUS | Status: AC
  Administered 2016-06-12: 250 mL via INTRAVENOUS

## 2016-06-12 NOTE — Progress Notes (Signed)
LVAD Coordinator Inpatient Rounding Note:  Heartware LVAD implanted on 5/29/2018by Peter Van Trigt.  Vital signs: HR: 73 Doppler Pressure: 66 Automatic BP: not done O2 Sat: 98on RA Wt in lbs: 200 > 216> 230>227>227>225>226>222>221>221   LVAD interrogation reveals:  Speed: 3000 Flow: 5.5 Power: 5.4 Peak /Trough: 5.3/5.0 Alarms:none  With flattened waveforms,both on flow and power  Called Bessie ([employer] clinical rep) and spoke with Amy Clegg, NP (at bedside) and made following changes:   Speed: 2940 Flow: 5.7 Power: 5.6 Peak /Trough: 6.5/4.8 - improvement in pulsatility noted on waveforms Alarms:none    Suction alarm ON  Alarm settings: Low flow: 3.0 High Power: 7.0   Above changes made along with 250cc NS bolus with following VAD parameters noted: Speed: 2940 Flow: 5.5 Power: 5.5 Peak /Trough: 6.6/4.5 with improved pulsatility noted Alarms:none   Speed: 2940 Flow: 5.7 Power: 5.6 Peak /Trough: 6.5/4.8 - improvement in pulsatility noted on waveforms Alarms:none  Suction alarm ON  Alarm settings: Low flow: 3.0 High Power: 7.5   Drive Line: Daily using daily dressing kit with aquacel silver strips on exit site. Father is performing dressing changes.   Gtts:  none  Blood products: 1 PC and 4 Plasma - in OR 05/30/16 1 unit PRBC- 06/06/16  ICD: St Jude single lead Therapy ON  Labs:  LDH trend: 234>237>225>231>228>242 >236>226  INR trend: 1.36 > 1.19>1.19>1.21>1.53>1.52>1.6>1.53>1.64  Anticoagulation Plan: -INR Goal: 2-2.5 -ASA Dose: 325 (started 05/31/16)  Adverse Events on VAD: -none  Plan/Recommendations:   1. Daily dressing changes. Please continue to assist patients father with dressing changes. 2. Planned removal of Pleurx catheter after heparin gtt is off 3. Home VAD equipment arrived; will deliver today. 4. Possible discharge home tomorrow.  Molly Reece RN, VAD Coordinator 24/7 pager [phone]    

## 2016-06-12 NOTE — Progress Notes (Signed)
Patient ID: [name], male   DOB: 01-Aug-1955, 61 y.o.   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    Transfused 1 unit PRBCs 06/06/16  Ramp echo (6/8) with increase speed to 3000 rpm.    Yesterday diuresed with a dose of IV lasix. Denies SOB/PND/Orthopnea. Denies dizziness. No bleeding problems.  Ambulated > 600 feet without difficulty.   LVAD INTERROGATION:  HVAD:  Flow  5.4 liters/min, RPM 3000   5.7 watts   Peak 5.3, trough 5 Flattened wave forms.   Objective:    Vital Signs:   Temp:  [97.8 F (36.6 C)-98.2 F (36.8 C)] 97.8 F (36.6 C) (06/11 0432) Last BM Date: 06/10/16 Mean arterial Pressure 66-70s   Intake/Output:   Intake/Output Summary (Last 24 hours) at 06/12/16 0839 Last data filed at 06/12/16 0500  Gross per 24 hour  Intake              240 ml  Output             3900 ml  Net            -3660 ml      Physical Exam: GENERAL: Well appearing, sitting on the side of the bed. NAD . HEENT: normal  NECK: Supple, JVP 7-8 .  2+ bilaterally, no bruits.  No lymphadenopathy or thyromegaly appreciated.   CARDIAC:  Mechanical heart sounds with LVAD hum present.  LUNGS:  Clear to auscultation bilaterally. R Pluerex Catheter. On room air.  ABDOMEN:  Soft, round, nontender, positive bowel sounds x4.     LVAD exit site:  Dressing dry and intact.  No erythema or drainage.  Stabilization device present and accurately applied.  Driveline dressing is being changed daily per sterile technique. EXTREMITIES:  Warm and dry, no cyanosis, clubbing, rash. R and LLE ted hose with trace edema.   NEUROLOGIC:  Alert and oriented x 4.  Gait steady.  No aphasia.  No dysarthria.  Affect pleasant.       Telemetry:  Personally reviewed. NSR 70s with PVCs.   Labs: Basic Metabolic Panel:  Recent Labs Lab 06/08/16 0207 06/09/16 0341 06/10/16 0338 06/11/16 0346 06/12/16 0204  NA  132* 130* 133* 134* 133*  K 3.9 3.8 3.9 4.0 4.6  CL 93* 93* 97* 98* 97*  CO2 28 28 28 30 29   GLUCOSE 96 187* 110* 97 188*  BUN 10 13 12 10 12   CREATININE 0.71 0.81 0.74 0.75 0.76  CALCIUM 8.3* 8.0* 8.3* 8.4* 8.3*    Liver Function Tests: No results for input(s): AST, ALT, ALKPHOS, BILITOT, PROT, ALBUMIN in the last 168 hours. No results for input(s): LIPASE, AMYLASE in the last 168 hours. No results for input(s): AMMONIA in the last 168 hours.  CBC:  Recent Labs Lab 06/08/16 0207 06/09/16 0341 06/10/16 0338 06/11/16 0346 06/12/16 0204  WBC 8.4 7.2 6.5 7.2 7.0  NEUTROABS 6.4 5.6 4.7 5.2 5.4  HGB 8.7* 8.4* 8.6* 8.7* 8.4*  HCT 26.8* 26.6* 27.3* 28.1* 26.8*  MCV 87.6 88.7 88.9 89.5 88.4  PLT 248 263 270 308 276    INR:  Recent Labs Lab 06/08/16 0207 06/09/16 0341 06/10/16 0338 06/11/16 0346 06/12/16 0204  INR 1.53 1.64 1.61 1.71 1.89    Other results:   Imaging: No results found.   Medications:     Scheduled Medications: .  amiodarone  200 mg Oral BID  . aspirin EC  325 mg Oral Daily   Or  . aspirin  324 mg Per Tube Daily   Or  . aspirin  300 mg Rectal Daily  . bisacodyl  10 mg Oral Daily   Or  . bisacodyl  10 mg Rectal Daily  . docusate sodium  200 mg Oral Daily  . ferrous fumarate-b12-vitamic C-folic acid  1 capsule Oral BID BM  . guaiFENesin  600 mg Oral BID  . insulin aspart  0-15 Units Subcutaneous TID WC  . insulin aspart  0-5 Units Subcutaneous QHS  . insulin detemir  18 Units Subcutaneous Daily  . losartan  25 mg Oral BID  . mouth rinse  15 mL Mouth Rinse BID  . oxymetazoline  1 spray Each Nare BID  . pantoprazole  40 mg Oral Daily  . sildenafil  20 mg Oral TID  . sodium chloride flush  3 mL Intravenous Q12H  . [START ON 06/13/2016] spironolactone  25 mg Oral Daily  . warfarin  12.5 mg Oral ONCE-1800  . Warfarin - Pharmacist Dosing Inpatient   Does not apply q1800    Infusions: . sodium chloride    . sodium chloride    . sodium  chloride 10 mL/hr at 06/07/16 1300    PRN Medications: hydrALAZINE, ondansetron (ZOFRAN) IV, oxyCODONE, sodium chloride flush, traMADol   Assessment/Plan/Discussion    1. Acute on chronic systolic CHF: Ischemic cardiomyopathy.  St Jude ICD.  05/26/16 ECHO EF 20-25%.   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 Milrinone stopped 6/3, co-ox 63% afterwards.   Ramp ECHO on 6/8 with speed increased to 3000 . Yesterday HVAD parameters stable with Peak 7.1 Trough 4.8 . Today Peak 5.3 Trough 5.0 with flattened wave forms/reduced pulsatility. . I suspect he is dry. I will give 250 cc NS now. Cut down speed to 2940. Will reassess in 1 hour if improved with increase speed back to 3000.  Hold diuretics. Map soft Hold losartan.  Renal functions table.    - Continue 324 mg aspirin + coumadin.  INR up to 1.89. Stop heparin drip. Discussed with Pharm D.  3. R Pleural Effusion:  R PleurX catheter.  Stop Heparin drip today anticipate removing pleurex after 12 hours per Dr Maren BeachVantrigt.   4. CAD: 3V CAD S/P stent Prox LAD and PTCA to subtotally occluded OMI 01/2016  5. Pulmonary HTN: PA catheter out.  PA pressures were lower post-LVAD.  - Pre-op, he was on Revatio 20 mg tid.  Continue revatio 20 mg tid. .   6. Former Smoker.   7. HTN: Map low today as noted above. Hold spiro and losartan. Can give pm dose of losartan.  8. Anemia: Got 1u PRBCs on 6/1 and again on 6/5.  No obvious bleeding. Continue protonix. Hgb stable 8.4     9. DM2: Insulin. -->. Insulin adjusted 06/07/16. Glucose ok.  10. Hyponatremia: Sodium 133.     Anticipate d/c 24 hours. Does not need HHPT. Continue HVAD education.   I reviewed the HVAD parameters from today, and compared the results to the patient's prior recorded data.  No programming changes were made.  The HVAD is functioning within specified parameters.    Length of Stay: [phone]  Tonye BecketAmy Clegg, NP  06/12/2016, 8:39 AM  VAD Team --- VAD ISSUES ONLY--- Pager [phone] (7am - 7am)  Patient seen and examined with  Tonye BecketAmy Clegg, NP. We discussed all aspects of the encounter. I agree with the assessment and  plan as stated above.    He appears overdiuresed today. Pulsativity flat on device interrogation. Will give gentle IVF and check co-ox. Will vut speed back temporarily and reassess. 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 Continue revatio.   INR 1.9. Can stop heparin. Coumadin dosing discussed with PharmD. Remove pacing wires and Pleurex later today.   Will hold d/c today. Continue to mobilize. Continue VAD teaching.   Arvilla Meres, MD  9:23 AM

## 2016-06-12 NOTE — Progress Notes (Signed)
Occupational Therapy Treatment and Discharge.  Patient Details Name: [name] MRN: [id number] DOB: 1955-05-15 Today's Date: 06/12/2016    History of present illness Pt adm with  Acute on chronic systolic CHF due to ICM and plans for LVAD after insurance approval. 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 PMH - Rt pleural effusion with pleurx, CHF, DM, HTN, CAD, PVD. BLE bypass grafts, AICD   OT comments  Pt is able to perform ADLs at supervision to mod I level.  All education completed.  OT will sign off at this time.  Pt agreeable.    Follow Up Recommendations  No OT follow up;Supervision/Assistance - 24 hour    Equipment Recommendations  3 in 1 bedside commode    Recommendations for Other Services      Precautions / Restrictions Precautions Precautions: Sternal;Fall Precaution Comments: Pt aware of sternal precautions but uses UE to some extent to rise from sitting. Pt aware of risk. Restrictions Other Position/Activity Restrictions: sternal precautions        Mobility Bed Mobility Overal bed mobility: Modified Independent                Transfers Overall transfer level: Modified independent                    Balance                                           ADL either performed or assessed with clinical judgement   ADL                                         General ADL Comments: Pt is completing ADLs mod I - supervision level. He is able to verbalize safe technique for all.  He reports socks continue to be difficult, but he can do them.  Discussed sock aid use and acquisition if needed      Vision       Perception     Praxis      Cognition Arousal/Alertness: Awake/alert Behavior During Therapy: WFL for tasks assessed/performed Overall Cognitive Status: Within Functional Limits for tasks assessed                                          Exercises     Shoulder Instructions        General Comments Pt able to verbalize how to safely manage equipment     Pertinent Vitals/ Pain       Pain Assessment: No/denies pain  Home Living                                          Prior Functioning/Environment              Frequency  Min 2X/week        Progress Toward Goals  OT Goals(current goals can now be found in the care plan section)  Progress towards OT goals: Goals met/education completed, patient discharged from [facility] All goals met and education completed, patient discharged from OT services  Co-evaluation                 AM-PAC PT "6 Clicks" Daily Activity     Outcome Measure   Help from another person eating meals?: None Help from another person taking care of personal grooming?: None Help from another person toileting, which includes using toliet, bedpan, or urinal?: None Help from another person bathing (including washing, rinsing, drying)?: A Little Help from another person to put on and taking off regular upper body clothing?: A Little Help from another person to put on and taking off regular lower body clothing?: A Little 6 Click Score: 21    End of Session    OT Visit Diagnosis: Unsteadiness on feet (R26.81)   Activity Tolerance Patient tolerated treatment well   Patient Left in bed;with call bell/phone within reach   Nurse Communication          Time: 3552-1747 OT Time Calculation (min): 12 min  Charges: OT General Charges $OT Visit: 1 Procedure OT Treatments $Self Care/Home Management : 8-22 mins  [facility], OTR/L [phone]    Lucille Passy M 06/12/2016, 6:46 PM

## 2016-06-12 NOTE — Progress Notes (Signed)
Care [facility] from [facility]. Pt denies needs at this time.   Dalton Rombergaitlin S Bumbledare, RN

## 2016-06-12 NOTE — Progress Notes (Signed)
ANTICOAGULATION CONSULT NOTE - Follow Up Consult  Pharmacy Consult for Warfarin / Heparin - d/c'd Indication: HVAD  Allergies  Allergen Reactions  . Metformin And Related Nausea And Vomiting    *Only the extended release* (does this)  . Niacin And Related Other (See Comments)    REACTION IS SIDE EFFECT "SEVERE" FLUSHING    Patient Measurements: Height: 6' (182.9 cm) Weight: 221 lb 6.4 oz (100.4 kg) IBW/kg (Calculated) : 77.6   Vital Signs: Temp: 97.8 F (36.6 C) (06/11 0432) Temp Source: Oral (06/11 0432)  Labs:  Recent Labs  06/10/16 0338 06/11/16 0346 06/12/16 0204  HGB 8.6* 8.7* 8.4*  HCT 27.3* 28.1* 26.8*  PLT 270 308 276  LABPROT 19.3* 20.3* 22.0*  INR 1.61 1.71 1.89  CREATININE 0.74 0.75 0.76    Estimated Creatinine Clearance: 118.9 mL/min (by C-G formula based on SCr of 0.76 mg/dL).   Assessment: 60yom with CAD- PCI LAD 1/18 on plavix PTA held for LVAD implant - Heartware 05/30/2016.   Warfarin started 5/30 post op day #1. INR remained low and heparin bridge added 6/3.   INR trend up overnight to 1.89. Heparin level has been subtherapeutic on 600 units/hr, so daily levels were d/c'd. Heparin stopped this morning by Dr. Donata ClayVan Trigt  Hgb 8s. LDH stable. No further nosebleeds noted.   Goal of Therapy:  INR 2-2.5 Monitor platelets by anticoagulation protocol: Yes   Plan:  1) Warfarin 12.5 mg again tonight 2) Daily  INR, CBC, LDH.  Tad MooreJessica Cashtyn Pouliot, Pharm D, BCPS  Clinical Pharmacist Pager [phone]  06/12/2016 10:53 AM

## 2016-06-12 NOTE — Progress Notes (Signed)
Day of Surgery Procedure(s) (LRB): REMOVAL OF PLEURAL DRAINAGE CATHETER (Right) Subjective: patient looks good Heparin drip stopped earlier today Will remove pleurx and EPWs in am Patient can go home tomorrow afternoon  Objective: Vital signs in last 24 hours: Temp:  [97.8 F (36.6 C)-98.2 F (36.8 C)] 97.8 F (36.6 C) (06/11 0432) Cardiac Rhythm: Normal sinus rhythm (06/11 0700) Weight:  [221 lb 6.4 oz (100.4 kg)] 221 lb 6.4 oz (100.4 kg) (06/11 0830)  Hemodynamic parameters for last 24 hours:    Intake/Output from previous day: 06/10 0701 - 06/11 0700 In: 240 [P.O.:240] Out: 3900 [Urine:3900] Intake/Output this shift: Total I/O In: 240 [P.O.:240] Out: 700 [Urine:700]       Exam    General- alert and comfortable   Lungs- clear without rales, wheezes   Cor- regular rate and rhythm, no murmur , gallop, VAD    hum present   Abdomen- soft, non-tender   Extremities - warm, non-tender, minimal edema   Neuro- oriented, appropriate, no focal weakness   Lab Results:  Recent Labs  06/11/16 0346 06/12/16 0204  WBC 7.2 7.0  HGB 8.7* 8.4*  HCT 28.1* 26.8*  PLT 308 276   BMET:  Recent Labs  06/11/16 0346 06/12/16 0204  NA 134* 133*  K 4.0 4.6  CL 98* 97*  CO2 30 29  GLUCOSE 97 188*  BUN 10 12  CREATININE 0.75 0.76  CALCIUM 8.4* 8.3*    PT/INR:  Recent Labs  06/12/16 0204  LABPROT 22.0*  INR 1.89   ABG    Component Value Date/Time   PHART 7.485 (H) 05/31/2016 1614   HCO3 28.7 (H) 05/31/2016 1614   TCO2 28 06/04/2016 1613   O2SAT 62.8 06/05/2016 0655   CBG (last 3)   Recent Labs  06/11/16 2108 06/12/16 0629 06/12/16 1102  GLUCAP 159* 133* 155*    Assessment/Plan: S/P HVAD placement Remove pleurx tomorrow    LOS: 19 days    Kathlee Nationseter Van Trigt III 06/12/2016

## 2016-06-12 NOTE — Progress Notes (Signed)
CARDIAC REHAB PHASE I   PRE:  Rate/Rhythm: 73 SR    BP: sitting 70    SaO2: wouldn't register  MODE:  Ambulation: 750 ft + 4 stairs   POST:  Rate/Rhythm: 93 SR    BP: sitting 86     SaO2: wouldn't register  Pt walked earlier this am approximately 850 ft however willing to walk again. Donned batteries and pushed IV pole, independent. Sts his legs don't hurt as bad today and wanted to do a few stairs. HR 93 SR after stairs, pt with appropriate mechanics. Sts he is having more energy than he did 2 days ago. Return to EOB, asked to be left on batteries.  [phone]  Harriet MassonRandi Kristan Makinzie Considine CES, ACSM 06/12/2016 10:42 AM

## 2016-06-13 ENCOUNTER — Encounter (HOSPITAL_COMMUNITY)
Admission: AD | Disposition: A | Discharge status: Home Health | Payer: Self-pay | Source: Ambulatory Visit | Attending: Cardiology

## 2016-06-13 DIAGNOSIS — I5023 Acute on chronic systolic (congestive) heart failure: Secondary | ICD-10-CM

## 2016-06-13 HISTORY — PX: REMOVAL OF PLEURAL DRAINAGE CATHETER: SHX5080

## 2016-06-13 LAB — CBC WITH DIFFERENTIAL/PLATELET
Basophils Absolute: 0 10*3/uL (ref 0.0–0.1)
Basophils Relative: 0 %
Eosinophils Absolute: 0.2 10*3/uL (ref 0.0–0.7)
Eosinophils Relative: 3 %
HCT: 27.4 % — ABNORMAL LOW (ref 39.0–52.0)
Hemoglobin: 8.5 g/dL — ABNORMAL LOW (ref 13.0–17.0)
Lymphocytes Relative: 14 %
Lymphs Abs: 1.1 10*3/uL (ref 0.7–4.0)
MCH: 27.7 pg (ref 26.0–34.0)
MCHC: 31 g/dL (ref 30.0–36.0)
MCV: 89.3 fL (ref 78.0–100.0)
Monocytes Absolute: 0.5 10*3/uL (ref 0.1–1.0)
Monocytes Relative: 6 %
Neutro Abs: 6 10*3/uL (ref 1.7–7.7)
Neutrophils Relative %: 77 %
Platelets: 285 10*3/uL (ref 150–400)
RBC: 3.07 MIL/uL — ABNORMAL LOW (ref 4.22–5.81)
RDW: 17.6 % — ABNORMAL HIGH (ref 11.5–15.5)
WBC: 7.9 10*3/uL (ref 4.0–10.5)

## 2016-06-13 LAB — GLUCOSE, CAPILLARY
GLUCOSE-CAPILLARY: 168 mg/dL — AB (ref 65–99)
Glucose-Capillary: 121 mg/dL — ABNORMAL HIGH (ref 65–99)

## 2016-06-13 LAB — BASIC METABOLIC PANEL
ANION GAP: 8 (ref 5–15)
BUN: 11 mg/dL (ref 6–20)
CALCIUM: 8.4 mg/dL — AB (ref 8.9–10.3)
CO2: 27 mmol/L (ref 22–32)
Chloride: 98 mmol/L — ABNORMAL LOW (ref 101–111)
Creatinine, Ser: 0.67 mg/dL (ref 0.61–1.24)
GFR calc non Af Amer: >60 mL/min (ref >60)
Glucose, Bld: 159 mg/dL — ABNORMAL HIGH (ref 65–99)
Potassium: 4.4 mmol/L (ref 3.5–5.1)
Sodium: 133 mmol/L — ABNORMAL LOW (ref 135–145)

## 2016-06-13 LAB — PROTIME-INR
INR: 1.98
Prothrombin Time: 22.8 seconds — ABNORMAL HIGH (ref 11.4–15.2)

## 2016-06-13 LAB — LACTATE DEHYDROGENASE: LDH: 209 U/L — ABNORMAL HIGH (ref 98–192)

## 2016-06-13 SURGERY — REMOVAL, CLOSED DRAINAGE CATHETER SYSTEM, PLEURAL
Anesthesia: Monitor Anesthesia Care | Laterality: Right

## 2016-06-13 MED ORDER — PANTOPRAZOLE SODIUM 40 MG PO TBEC: 40.0000 mg | DELAYED_RELEASE_TABLET | Freq: Every day | ORAL | 6 refills | Status: DC

## 2016-06-13 MED ORDER — LOSARTAN POTASSIUM 25 MG PO TABS: 25.0000 mg | ORAL_TABLET | Freq: Two times a day (BID) | ORAL | 6 refills | Status: DC

## 2016-06-13 MED ORDER — AMIODARONE HCL 200 MG PO TABS: 200.0000 mg | ORAL_TABLET | Freq: Every day | ORAL | 6 refills | Status: DC

## 2016-06-13 MED ORDER — FUROSEMIDE 40 MG PO TABS
40.0000 mg | ORAL_TABLET | Freq: Every day | ORAL | Status: DC
  Administered 2016-06-13: 40 mg via ORAL
  Filled 2016-06-13: qty 1

## 2016-06-13 MED ORDER — FE FUMARATE-B12-VIT C-FA-IFC PO CAPS: 1.0000 | ORAL_CAPSULE | Freq: Two times a day (BID) | ORAL | 6 refills | Status: DC

## 2016-06-13 MED ORDER — LIDOCAINE HCL 1 % IJ SOLN
INTRAMUSCULAR | Status: AC
  Filled 2016-06-13: qty 20

## 2016-06-13 MED ORDER — WARFARIN SODIUM 2.5 MG PO TABS
12.5000 mg | ORAL_TABLET | Freq: Every day | ORAL | Status: DC
  Filled 2016-06-13: qty 1

## 2016-06-13 MED ORDER — SPIRONOLACTONE 25 MG PO TABS: 12.5000 mg | ORAL_TABLET | Freq: Every day | ORAL | 6 refills | Status: DC

## 2016-06-13 MED ORDER — WARFARIN SODIUM 5 MG PO TABS: 12.5000 mg | ORAL_TABLET | Freq: Every day | ORAL | 6 refills | Status: DC

## 2016-06-13 MED ORDER — ASPIRIN 325 MG PO TBEC: 325.0000 mg | DELAYED_RELEASE_TABLET | Freq: Every day | ORAL | 6 refills | Status: DC

## 2016-06-13 MED ORDER — LIDOCAINE HCL 1 % IJ SOLN
INTRAMUSCULAR | Status: DC | PRN
  Administered 2016-06-13: 2 mL via INTRADERMAL
  Administered 2016-06-13: 6 mL via INTRADERMAL

## 2016-06-13 MED ORDER — FUROSEMIDE 40 MG PO TABS: 40.0000 mg | ORAL_TABLET | Freq: Every day | ORAL | 6 refills | Status: DC

## 2016-06-13 NOTE — Progress Notes (Signed)
Patient ID: [name], male   DOB: 06/20/1955, 61 y.o.   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    Transfused 1 unit PRBCs 06/06/16  Ramp echo (6/8) with increase speed to 3000 rpm.    Yesterday he was given 250 cc NS and speed cut down to 2940 due to flattened wave form and reduced pulsatility. This was thought to be from over diuresis with RV failure. Losartan was held.    Wants to go home. Denies SOB/CP.   LVAD INTERROGATION:  HVAD:  Flow  5.8  liters/min, RPM 2940   5.7  watts   Peak7.1, trough 4.8 .   Objective:    Vital Signs:   Temp:  [97.9 F (36.6 C)-98.4 F (36.9 C)] 97.9 F (36.6 C) (06/12 0401) Weight:  [224 lb 8 oz (101.8 kg)] 224 lb 8 oz (101.8 kg) (06/12 0401) Last BM Date: 06/12/16 Mean arterial Pressure 70s   Intake/Output:   Intake/Output Summary (Last 24 hours) at 06/13/16 0844 Last data filed at 06/12/16 1920  Gross per 24 hour  Intake              360 ml  Output             1480 ml  Net            -1120 ml       Physical Exam: GENERAL: Well appearing, male sitting on the side of the bed. NAD . HEENT: normal  NECK: Supple, JVP ~10 .  2+ bilaterally, no bruits.  No lymphadenopathy or thyromegaly appreciated.   CARDIAC:  Mechanical heart sounds with LVAD hum present.  LUNGS:  Clear to auscultation bilaterally.  ABDOMEN:  Soft, round, nontender, positive bowel sounds x4.     LVAD exit site:  Dressing dry and intact.  No erythema or drainage.  Stabilization device present and accurately applied.  Driveline dressing is being changed daily per sterile technique. EXTREMITIES:  Warm and dry, no cyanosis, clubbing, rash. R and LLE ted hose trace-1+ edema  NEUROLOGIC:  Alert and oriented x 4.  Gait steady.  No aphasia.  No dysarthria.  Affect pleasant.       Telemetry:  Personally reviewed NSR 70-80s with PVCs.   Labs: Basic Metabolic  Panel:  Recent Labs Lab 06/09/16 0341 06/10/16 0338 06/11/16 0346 06/12/16 0204 06/13/16 0234  NA 130* 133* 134* 133* 133*  K 3.8 3.9 4.0 4.6 4.4  CL 93* 97* 98* 97* 98*  CO2 28 28 30 29 27   GLUCOSE 187* 110* 97 188* 159*  BUN 13 12 10 12 11   CREATININE 0.81 0.74 0.75 0.76 0.67  CALCIUM 8.0* 8.3* 8.4* 8.3* 8.4*    Liver Function Tests: No results for input(s): AST, ALT, ALKPHOS, BILITOT, PROT, ALBUMIN in the last 168 hours. No results for input(s): LIPASE, AMYLASE in the last 168 hours. No results for input(s): AMMONIA in the last 168 hours.  CBC:  Recent Labs Lab 06/09/16 0341 06/10/16 0338 06/11/16 0346 06/12/16 0204 06/13/16 0234  WBC 7.2 6.5 7.2 7.0 7.9  NEUTROABS 5.6 4.7 5.2 5.4 6.0  HGB 8.4* 8.6* 8.7* 8.4* 8.5*  HCT 26.6* 27.3* 28.1* 26.8* 27.4*  MCV 88.7 88.9 89.5 88.4 89.3  PLT 263 270 308 276 285    INR:  Recent Labs Lab 06/09/16 0341 06/10/16 0338 06/11/16 0346  06/12/16 0204 06/13/16 0234  INR 1.64 1.61 1.71 1.89 1.98    Other results:   Imaging: No results found.   Medications:     Scheduled Medications: . amiodarone  200 mg Oral BID  . aspirin EC  325 mg Oral Daily   Or  . aspirin  324 mg Per Tube Daily   Or  . aspirin  300 mg Rectal Daily  . bisacodyl  10 mg Oral Daily   Or  . bisacodyl  10 mg Rectal Daily  . docusate sodium  200 mg Oral Daily  . ferrous fumarate-b12-vitamic C-folic acid  1 capsule Oral BID BM  . guaiFENesin  600 mg Oral BID  . insulin aspart  0-15 Units Subcutaneous TID WC  . insulin aspart  0-5 Units Subcutaneous QHS  . insulin detemir  18 Units Subcutaneous Daily  . losartan  25 mg Oral BID  . mouth rinse  15 mL Mouth Rinse BID  . oxymetazoline  1 spray Each Nare BID  . pantoprazole  40 mg Oral Daily  . sildenafil  20 mg Oral TID  . sodium chloride flush  3 mL Intravenous Q12H  . spironolactone  25 mg Oral Daily  . Warfarin - Pharmacist Dosing Inpatient   Does not apply q1800    Infusions: .  sodium chloride    . sodium chloride    . sodium chloride 10 mL/hr at 06/07/16 1300    PRN Medications: hydrALAZINE, ondansetron (ZOFRAN) IV, oxyCODONE, sodium chloride flush, traMADol   Assessment/Plan/Discussion    1. Acute on chronic systolic CHF: Ischemic cardiomyopathy.  St Jude ICD.  05/26/16 ECHO EF 20-25%.   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 Milrinone stopped 6/3, co-ox 63% afterwards.   Ramp ECHO on 6/8 with speed increased to 3000.  Speed cut down to 2940 due to reduce pulsatility and flattened wave forms.  Restart 40 mg po lasix +  Continue losartan 25 mg twice a day.   Will need be careful with diuresis due to RV failure. Will need to try and keep weight ~224 pounds. Will instruct him to take and extra 40 mg of lasix if weight at home is 228 or greater.  - Continue 324 mg aspirin + coumadin. INR 1.98. Will discuss with Pharm D home coumadin dose.  3. R Pleural Effusion:  R PleurX catheter.  Removal today per Dr Alla German.    4. CAD: 3V CAD S/P stent Prox LAD and PTCA to subtotally occluded OMI 01/2016  5. Pulmonary HTN: PA catheter out.  PA pressures were lower post-LVAD.  - Pre-op, he was on Revatio 20 mg tid.  Continue revatio 20 mg tid. .   6. Former Smoker.   7. HTN: Map low today as noted above. Hold spiro and losartan. Can give pm dose of losartan.  8. Anemia: Got 1u PRBCs on 6/1 and again on 6/5.  No obvious bleeding. Continue protonix. Hgb 8.5   Stable.  9. DM2: Insulin. -->. Insulin adjusted 06/07/16. Glucose ok.  10. Hyponatremia: sodium 133.      D/C home later this afternoon.   I reviewed the HVAD parameters from today, and compared the results to the patient's prior recorded data.  No programming changes were made.  The HVAD is functioning within specified parameters.    Length of Stay: 20  [name], NP  06/13/2016, 8:44 AM  VAD Team --- VAD ISSUES ONLY--- Pager [phone] (7am - 7am)  Patient seen and examined with [name], NP. We discussed all aspects  of the  encounter. I agree with the assessment and plan as stated above.   Much improved today. Volume status still elevated in setting of RV failure but I think this is about as good as we can get him for now. He has had his Pleurex cath removed today and we will send him home after bedrest is complete. VAD parameters stable. Will see him in VAD clinic on Friday.   Arvilla Meres, MD  10:07 PM

## 2016-06-13 NOTE — Discharge Summary (Signed)
Advanced Heart Failure Team  Discharge Summary   Patient ID: [name] MRN: [id number], DOB/AGE: Jul 18, 1955 61 y.o. Admit date: 05/24/2016 D/C date:     06/15/2016   Primary Discharge Diagnoses:  1. S/P HVAD DT  On aspirin 325 and coumadin. INR goal 2-3.  Speed 2940  2. A/C Systolic Heart Failure: ICM  ST Jude ICD 3. R Pleural Effusion  4. CAD: 3V CAD S/P stent Prox LAD and PTCA to subtotally occluded OMI 01/2016  5. Pulmonary HTN: sildenafil 20 mg three times a day  6. HTN 7. Anemia 8. DMII 9. Hyponatremia 10. Former Smoker   Hospital Course: [name] is a 61 y.o. male with history of Chronic systolic CHF due to ICM (admitted on milrinone 0.125 mcg),  Echo in 5/18 with EF 20-25%, 3 CAD stent to proximal LAD and PTCA to subtotally occluded large OM1 in 1/18, PAD with claudication, Carotid stenosis, R  pleural effusion s/p PleurX catheter placement, Pulmonary Arterial HTN, and Tobacco abuse.   He had a rapid decline over past few months including requirement of PleurX catheter due to significant R pleural effusion and decompensation to NYHA Class IV symptoms. He underwent LVAD work up and been [facility] . [facility] and deemed appropriate for VAD under DT criteria.  Admitted with increased dyspnea and fatigue secondary to A/C Systolic Heart Failure despite home inotropes. He was INTERMACS 2. Once HF was optimized he underwent HVAD on 05/30/2016 for DT. Post operatively pressors were gradually weaned off. Diuresed with IV lasix and later transitioned to po diuretics. Attempted to add entresto however Maps dropped so he was switched back to losartan. He will continue on 325 mg aspirin daily + coumadin with INR goal 2-3.  At the time of discharge he was able to ambulated 600 feet without difficulty. Pain was controlled with tylenol therefore he is not being discharged with pain medications. He received extensive HVAD education provided by the VAD  coordinators. His father will perform daily dressing changes. He understands he will be able to drive for now.  He will continue to be followed closely by the HF/VAD team. He will return to clinic on Friday for INR check with full visit next week.   1. Acute on chronic systolic CHF: Ischemic cardiomyopathy.  St Jude ICD.  05/26/16 ECHO EF 20-25%.   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 Post operatively pressors gradually weaned with Milrinone stopped 6/3, co-ox 63% afterwards.  Ramp ECHO was performed on 6/8 to optimize HVAD speed. Speed was increased to 3000 however on 6/11 speed was cut back to 2940 due to loss of pulsatility and flattened wave forms thought to be from over diuresis. He was given 250 cc NS. Diuretics held for a day and later restarted at 40 mg lasix daily. Continue losartan 25 mg twice a day. Renal function followed closely. Creatinine stable at the time of discharge.   We will need be careful with diuresis due to RV failure. Will need to try and keep weight ~224 pounds. Will instruct him to take and extra 40 mg of lasix if weight at home is 228 or greater.  - Plan to continue 324 mg aspirin + coumadin. INR 1.98. Will discuss with Pharm D home coumadin dose.  3. R Pleural Effusion:  R PleurX catheter present on admission for recurrent effusion. PleurX catheter removed on 06/13/2016.    4. CAD: 3V CAD S/P stent Prox LAD and PTCA to subtotally occluded OMI  01/2016  5. Pulmonary HTN: PA catheter out.  PA pressures were lower post-LVAD.  Plan to continue Revatio 20 mg tid.  6. Former Smoker.   7. HTN: Maps followed closely and managed with losartan and spironolactone.   8. Anemia: Hgb trending on 6/5 so he received 1UPRBCs. Continue protonix. On the day discharge hgb was 8.5  9. DM2: Followed closely with sliding scale. He will follow up with PCP for ongoing management.  10. Hyponatremia: sodium 133. Plan to repeat BMET next week.   HVAD Interrogation:   Speed: 2940     Flow:  5.8  Watts 5.7  Peak 7.1 Trough  4.8  Suction On        Discharge Weight: 224 pounds with equipment Discharge Vitals: Blood pressure 91/74, pulse 79, temperature 97.9 F (36.6 C), temperature source Oral, resp. rate 18, height 6' (1.829 m), weight 224 lb 8 oz (101.8 kg), SpO2 92 %.  Labs: Lab Results  Component Value Date   WBC 7.9 06/13/2016   HGB 8.5 (L) 06/13/2016   HCT 27.4 (L) 06/13/2016   MCV 89.3 06/13/2016   PLT 285 06/13/2016     Recent Labs Lab 06/13/16 0234  NA 133*  K 4.4  CL 98*  CO2 27  BUN 11  CREATININE 0.67  CALCIUM 8.4*  GLUCOSE 159*   Lab Results  Component Value Date   CHOL 103 03/10/2016   HDL 49 03/10/2016   LDLCALC 45 03/10/2016   TRIG 43 03/10/2016   BNP (last 3 results)  Recent Labs  05/24/16 1120 05/31/16 0228 06/06/16 0016  BNP 374.5* 270.0* 398.6*    ProBNP (last 3 results) No results for input(s): PROBNP in the last 8760 hours.   Diagnostic Studies/Procedures   No results found.  Discharge Medications   Allergies as of 06/13/2016      Reactions   Metformin And Related Nausea And Vomiting   *Only the extended release* (does this)   Niacin And Related Other (See Comments)   REACTION IS SIDE EFFECT "SEVERE" FLUSHING      Medication List    STOP taking these medications   aspirin 81 MG tablet Replaced by:  aspirin 325 MG EC tablet   chlorhexidine 0.12 % solution Commonly known as:  PERIDEX   clopidogrel 75 MG tablet Commonly known as:  PLAVIX   digoxin 0.125 MG tablet Commonly known as:  LANOXIN   milrinone 20 MG/100 ML Soln infusion Commonly known as:  PRIMACOR   potassium chloride SA 20 MEQ tablet Commonly known as:  K-DUR,KLOR-CON   torsemide 20 MG tablet Commonly known as:  DEMADEX     TAKE these medications   amiodarone 200 MG tablet Commonly known as:  PACERONE Take 1 tablet (200 mg total) by mouth daily.   aspirin 325 MG EC tablet Take 1 tablet (325 mg total) by mouth daily. Replaces:  aspirin 81 MG tablet   atorvastatin  80 MG tablet Commonly known as:  LIPITOR Take 1 tablet (80 mg total) by mouth every other day.   ezetimibe 10 MG tablet Commonly known as:  ZETIA Take 1 tablet (10 mg total) by mouth daily.   ferrous fumarate-b12-vitamic C-folic acid capsule Commonly known as:  TRINSICON / FOLTRIN Take 1 capsule by mouth 2 (two) times daily between meals.   fexofenadine 180 MG tablet Commonly known as:  ALLEGRA Take 180 mg by mouth every morning.   furosemide 40 MG tablet Commonly known as:  LASIX Take 1 tablet (40 mg total)  by mouth daily. Take an extra 40 mg for weight 228 or greater   insulin NPH-regular Human (70-30) 100 UNIT/ML injection Commonly known as:  NOVOLIN 70/30 Inject 15-30 Units into the skin 3 (three) times daily. 30 units in the morning, 15-20 units in the afternoon, 15-20 units in the evening   losartan 25 MG tablet Commonly known as:  COZAAR Take 1 tablet (25 mg total) by mouth 2 (two) times daily.   pantoprazole 40 MG tablet Commonly known as:  PROTONIX Take 1 tablet (40 mg total) by mouth daily.   sildenafil 20 MG tablet Commonly known as:  REVATIO Take 1 tablet (20 mg total) by mouth 3 (three) times daily.   spironolactone 25 MG tablet Commonly known as:  ALDACTONE Take 0.5 tablets (12.5 mg total) by mouth daily.   warfarin 5 MG tablet Commonly known as:  COUMADIN Take 2.5 tablets (12.5 mg total) by mouth daily at 6 PM.       Disposition   The patient will be discharged in stable condition to home. Discharge Instructions    (HEART FAILURE PATIENTS) Call MD:  Anytime you have any of the following symptoms: 1) 3 pound weight gain in 24 hours or 5 pounds in 1 week 2) shortness of breath, with or without a dry hacking cough 3) swelling in the hands, feet or stomach 4) if you have to sleep on extra pillows at night in order to breathe.    Complete by:  As directed    Amb Referral to Cardiac Rehabilitation    Complete by:  As directed    Diagnosis:    Other Heart Failure (see criteria below if ordering Phase II)     Heart Failure Type:  Chronic Systolic & Diastolic   Change dressing (specify)    Complete by:  As directed    Dressing change: Daily dressing changes per VAD Team. .   Diet - low sodium heart healthy    Complete by:  As directed    Heart Failure patients record your daily weight using the same scale at the same time of day    Complete by:  As directed    INR  Goal: 2 - 3    Complete by:  As directed    Goal:  2 - 3   Increase activity slowly    Complete by:  As directed    Page VAD Coordinator at [phone]  Notify for: any VAD alarms, sustained elevations of power >10 watts, sustained drop in Pulse Index <3    Complete by:  As directed    Notify for:   any VAD alarms sustained elevations of power >10 watts sustained drop in Pulse Index <3     Speed Settings:    Complete by:  As directed    Fixed 2940     Follow-up Information    Laurey Morale, MD Follow up on 06/19/2016.   Specialty:  Cardiology Why:  at 12:00 Garage Code 6002  Contact information: [address] [place] [address] [phone]        [facility] Follow up on 06/16/2016.   Specialty:  Cardiology Why:  1000 for INR checked Contact information: [address] [id number] mc [place] [address] [phone]            Duration of Discharge Encounter: Greater than 35 minutes   Signed, [name] NP-C  06/15/2016, 1:05 PM   Agree. He  is improved s/p VAD placement. Can be d/c's today. VAD teaching complete. He has displayed competency. Will see in VAD Clinic on Friday.   Arvilla Meres, MD  7:21 PM

## 2016-06-13 NOTE — Progress Notes (Signed)
The patient was examined and preop studies reviewed. There has been no change from the prior exam and the patient is ready for surgery.  Plan removal of Right Pleurx  catheter on L Bossman

## 2016-06-13 NOTE — Care Management Note (Signed)
Case Management Note Donn PieriniKristi Sheral Pfahler RN, BSN Unit 2W-Case Manager-- 2H coverage [phone]  Patient Details  Name: Dalton RainwaterLarry D [name] MRN: [id number] Date of Birth: 01-31-1955  Subjective/Objective:   Pt admitted with acute on chronic HF- plan was for LVAD placement- however pt's insurance denied having LVAD placed here- team working on that - tentative plan for LVAD next Tuesday 5/29 after Plavix wash out                Action/Plan:  PTA pt lived at home his father assist as caregiver- [facility]- pt is on home IV milrinone- also has PleurX cath. - CM will follow for d/c needs pending on LVAD outcome-   Expected Discharge Date:  06/13/16               Expected Discharge Plan:  Home w Home Health Services  In-House Referral:     Discharge planning Services  CM Consult  Post Acute Care Choice:  Home Health, Resumption of Svcs/PTA Provider Choice offered to:  Patient  DME Arranged:    DME Agency:     HH Arranged:  RN, Disease Management HH Agency:  [facility]  Status of Service:  Completed, signed off  If discussed at Long Length of Stay Meetings, dates discussed:  6/5, 6/7, 6/12  Discharge Disposition: home/self care   Additional Comments:  06/14/15- 1400- Donn PieriniKristi Alixander Rallis, RN, CM- pt for d/c home today- has had PleurX cath removed- [facility] at [facility] or DME needs. Pt to d/c home with dad to assist.   06/05/16- 100- Donn PieriniKristi Rushawn Capshaw RN, CM- pt s/p new HVAD on 05/30/16- plan is to return home with dad to assist- CM will follow for [facility] needs- pt currently still has pleurX cath- ?if will d/c cath prior to discharge.-  CM will continue to follow for any d/c needs.   Darrold SpanWebster, Keiron Iodice Hall, RN 06/13/2016, 2:13 PM

## 2016-06-13 NOTE — Progress Notes (Signed)
CSW met with patient at bedside. Patient and father present and ready for discharge home. Patient states "I am ready and looking forward to going home". Patient and Father were very grateful to team for support throughout the LVAD implant and recovery process. CSW discussed LVAD support group and encouraged patient to attend when ready. Patient and Father deny any concerns at the moment and aware of VAD pager and support from team post hospitalization. CSW will continue to follow through the outpatient VAD clinic. Raquel Sarna, [place], [place]

## 2016-06-13 NOTE — Op Note (Signed)
NAME:  Lenard SimmerFULP, Greg                       ACCOUNT NO.:  MEDICAL RECORD NO.:  [id number]  LOCATION:                                 FACILITY:  PHYSICIAN:  Kerin PernaPeter Van Trigt, M.D.  DATE OF BIRTH:  1955/10/15  DATE OF PROCEDURE:  06/13/2016 DATE OF DISCHARGE:                              OPERATIVE REPORT   OPERATION:  Removal of right PleurX catheter.  SURGEON:  Kerin PernaPeter Van Trigt, M.D.  ANESTHESIA:  Local with 1% lidocaine.  PREOPERATIVE DIAGNOSIS:  History of heart failure with recurrent right pleural effusion drained with PleurX catheter, now resolved after implantable ventricular assist device.  OPERATIVE PROCEDURE:  The patient was brought to the procedure room in the outpatient patient care area, where informed consent was performed in the proper side marked.  The right chest was prepped and draped as a sterile field.  Local 1% lidocaine was infiltrated in the exit site of the PleurX catheter.  The exit site was extended with a 15 blade scalpel.  Using hemostats and gentle traction on the catheter, the adhesions to the Dacron cuff were taken down and the PleurX catheter was removed in its entirety.  Bleeding was controlled with handheld pressure and the incision was closed with 4-0 interrupted silk sutures.  A sterile dressing was applied.     Kerin PernaPeter Van Trigt, M.D.     PV/MEDQ  D:  06/13/2016  T:  06/13/2016  Job:  [id number]

## 2016-06-13 NOTE — Progress Notes (Signed)
LVAD Coordinator Inpatient Rounding Note:  Heartware LVAD implanted on 5/29/2018by Tharon Aquas Trigt.  Vital signs: HR: 74 Doppler Pressure: 72 Automatic BP: not done O2 Sat: 98on RA Wt in lbs: 200 > 216> 230>227>227>225>226>222>221>221>224   LVAD interrogation reveals:  Speed: 2940 Flow: 6.0 Power: 5.8 Peak /Trough: 7.5/4.5 Alarms:none    Suction alarm ON Lavare Cycle turned ON today.  Alarm settings: Low flow: 3.0 High Power: 8.0    Drive Line: Daily using daily dressing kit with aquacel silver strips on exit site. Father is performing dressing changes.   Gtts:  none  Blood products: 1 PC and 4 Plasma - in OR 05/30/16 1 unit PRBC- 06/06/16  ICD: St Jude single lead Therapy ON  Labs:  LDH trend: 234>237>225>231>228>242 >236>226>209  INR trend: 1.36 > 1.19>1.19>1.21>1.53>1.52>1.6>1.53>1.64>1.98  Anticoagulation Plan: -INR Goal: 2-2.5 -ASA Dose: 325 (started 05/31/16)  Adverse Events on VAD: -none  Plan/Recommendations:   1. Daily dressing changes.  2. Pleurix removed today. 3. Will go over VAD discharge checklist this afternoon. 4. Discharge home later today. 5. Return to clinic for INR and dressing check of old Pleurix site on Friday at 10am. 6. Will send log files to [facility] today.   Tanda Rockers RN, VAD Coordinator 24/7 pager [phone]

## 2016-06-13 NOTE — Progress Notes (Signed)
ANTICOAGULATION CONSULT NOTE - Follow Up Consult  Pharmacy Consult for Warfarin Indication: HVAD  Allergies  Allergen Reactions  . Metformin And Related Nausea And Vomiting    *Only the extended release* (does this)  . Niacin And Related Other (See Comments)    REACTION IS SIDE EFFECT "SEVERE" FLUSHING    Patient Measurements: Height: 6' (182.9 cm) Weight: 224 lb 8 oz (101.8 kg) IBW/kg (Calculated) : 77.6   Vital Signs: Temp: 97.9 F (36.6 C) (06/12 0401) Temp Source: Oral (06/12 0401) Pulse Rate: 79 (06/12 1037)  Labs:  Recent Labs  06/11/16 0346 06/12/16 0204 06/13/16 0234  HGB 8.7* 8.4* 8.5*  HCT 28.1* 26.8* 27.4*  PLT 308 276 285  LABPROT 20.3* 22.0* 22.8*  INR 1.71 1.89 1.98  CREATININE 0.75 0.76 0.67    Estimated Creatinine Clearance: 119.7 mL/min (by C-G formula based on SCr of 0.67 mg/dL).   Assessment: 60yom with CAD- PCI LAD 1/18 on plavix PTA held for LVAD implant - Heartware 05/30/2016.   Warfarin started 5/30 post op day #1. INR remained low and heparin bridge added 6/3.   INR trend up overnight to 1.89. Heparin level has been subtherapeutic on 600 units/hr, so daily levels were d/c'd. Heparin stopped this morning by Dr. Donata ClayVan Trigt.  Hgb 8.5s. LDH stable. No further nosebleeds noted.   Goal of Therapy:  INR 2-3 (for Heart Ware) Monitor platelets by anticoagulation protocol: Yes   Plan:  1) Warfarin 12.5 mg again tonight.  Recommend 12.5 mg daily at discharge until INR recheck on Friday. 2) Daily  INR, CBC, LDH.  Tad MooreJessica Morna Flud, Pharm D, BCPS  Clinical Pharmacist Pager [phone]  06/13/2016 11:03 AM

## 2016-06-13 NOTE — Progress Notes (Signed)
Ed completed with pt and father. Voiced understanding/good reception. He has plans in place to quit smoking. Will refer to G'sO CRPII.  [phone] Ethelda ChickKristan Sherrian Nunnelley CES, ACSM 11:33 AM 06/13/2016

## 2016-06-13 NOTE — Progress Notes (Signed)
Day of Surgery Procedure(s) (LRB): REMOVAL OF PLEURAL DRAINAGE CATHETER (Right) Subjective: R Pleurx catheter removed, EPWs removed Ready for DC home Objective: Vital signs in last 24 hours: Temp:  [97.9 F (36.6 C)-98.4 F (36.9 C)] 97.9 F (36.6 C) (06/12 0401) Cardiac Rhythm: Normal sinus rhythm;Heart block;Bundle branch block (06/12 0701) Weight:  [224 lb 8 oz (101.8 kg)] 224 lb 8 oz (101.8 kg) (06/12 0401)  Hemodynamic parameters for last 24 hours:    Intake/Output from previous day: 06/11 0701 - 06/12 0700 In: 600 [P.O.:600] Out: 1480 [Urine:1480] Intake/Output this shift: Total I/O In: 360 [P.O.:360] Out: 450 [Urine:450]  Incisions clean and dry  Lab Results:  Recent Labs  06/12/16 0204 06/13/16 0234  WBC 7.0 7.9  HGB 8.4* 8.5*  HCT 26.8* 27.4*  PLT 276 285   BMET:  Recent Labs  06/12/16 0204 06/13/16 0234  NA 133* 133*  K 4.6 4.4  CL 97* 98*  CO2 29 27  GLUCOSE 188* 159*  BUN 12 11  CREATININE 0.76 0.67  CALCIUM 8.3* 8.4*    PT/INR:  Recent Labs  06/13/16 0234  LABPROT 22.8*  INR 1.98   ABG    Component Value Date/Time   PHART 7.485 (H) 05/31/2016 1614   HCO3 28.7 (H) 05/31/2016 1614   TCO2 28 06/04/2016 1613   O2SAT 62.8 06/05/2016 0655   CBG (last 3)   Recent Labs  06/12/16 1607 06/12/16 2125 06/13/16 0619  GLUCAP 331* 243* 121*    Assessment/Plan: S/P Procedure(s) (LRB): REMOVAL OF PLEURAL DRAINAGE CATHETER (Right) S/P HVAD  Home today   LOS: 20 days    Kathlee Nationseter Van Trigt III 06/13/2016

## 2016-06-14 ENCOUNTER — Ambulatory Visit (HOSPITAL_COMMUNITY): Admission: RE | Admit: 2016-06-14 | Payer: BLUE CROSS/BLUE SHIELD | Source: Ambulatory Visit

## 2016-06-14 ENCOUNTER — Ambulatory Visit: Payer: BLUE CROSS/BLUE SHIELD | Admitting: Vascular Surgery

## 2016-06-14 ENCOUNTER — Encounter (HOSPITAL_COMMUNITY): Payer: BLUE CROSS/BLUE SHIELD

## 2016-06-14 ENCOUNTER — Ambulatory Visit (HOSPITAL_COMMUNITY): Payer: BLUE CROSS/BLUE SHIELD

## 2016-06-15 ENCOUNTER — Encounter (HOSPITAL_COMMUNITY): Payer: Self-pay | Admitting: Cardiothoracic Surgery

## 2016-06-16 ENCOUNTER — Telehealth (HOSPITAL_COMMUNITY): Payer: Self-pay | Admitting: Pharmacist

## 2016-06-16 ENCOUNTER — Encounter (HOSPITAL_COMMUNITY): Payer: BLUE CROSS/BLUE SHIELD

## 2016-06-16 ENCOUNTER — Ambulatory Visit (HOSPITAL_COMMUNITY)
Admission: RE | Admit: 2016-06-16 | Discharge: 2016-06-16 | Disposition: A | Discharge status: Home / Self Care | Payer: BLUE CROSS/BLUE SHIELD | Source: Ambulatory Visit | Attending: Internal Medicine | Admitting: Internal Medicine

## 2016-06-16 ENCOUNTER — Ambulatory Visit (HOSPITAL_COMMUNITY): Payer: Self-pay | Admitting: Pharmacist

## 2016-06-16 DIAGNOSIS — Z95811 Presence of heart assist device: Secondary | ICD-10-CM | POA: Diagnosis not present

## 2016-06-16 LAB — PROTIME-INR
INR: 2.63
Prothrombin Time: 28.6 seconds — ABNORMAL HIGH (ref 11.4–15.2)

## 2016-06-16 NOTE — Progress Notes (Signed)
Patient presents for 1 week labs in VAD Clinic today. Reports no problems with VAD equipment or concerns with drive line. Dressing to old PluerX catheter site removed, area cleansed with CHG, and band-aid applied. Instructed patient he could leave it open to air if dry.   VAD Indication: Destination Therapy- smoking history    I reviewed the LVAD parameters from today and compared the results to the patient's prior recorded data. LVAD interrogation was NEGATIVE for significant power changes, NEGATIVE for clinical alarms and STABLE for PI events/speed drops. No programming changes were made and pump is functioning within specified parameters. Pt is performing daily controller and system monitor self tests along with completing weekly and monthly maintenance for LVAD equipment.  LVAD equipment check completed and is in good working order. Back-up equipment present. .   Exit Site Care: Drive line is being maintained daily  by Dannielle Huhanny, his Dad Dressing dry and intact.  Stabilization device present and accurately applied. Pt denies fever or chills. Pt states they have adequate dressing supplies at home.   Significant Events on VAD Support:  none  Plan:  1.Return to clinic next week for full visit. 2. We will call with INR results today 3. Reminded to take extra Lasix if weight >225    Marcellus ScottLesley Wilson RN VAD Coordinator   Office: [phone] 24/7 Emergency VAD Pager: [phone]

## 2016-06-16 NOTE — Telephone Encounter (Signed)
Encounter opened erroneously.

## 2016-06-19 ENCOUNTER — Encounter (HOSPITAL_COMMUNITY): Payer: BLUE CROSS/BLUE SHIELD

## 2016-06-19 ENCOUNTER — Encounter (HOSPITAL_COMMUNITY): Payer: Self-pay

## 2016-06-20 ENCOUNTER — Telehealth (HOSPITAL_COMMUNITY): Payer: Self-pay

## 2016-06-20 NOTE — Telephone Encounter (Signed)
Patient insurance is active and benefits verified. Patient has BCBS - no co-payment, deductible $1750/$1750 has been met, out of pocket $6850/$6850 has been met, 25% co-insurance and no pre-authorization. Passport/reference [phone].  Patient has attended cardiac rehab before and has used some of his visits for cardiac rehab per [facility]. Information given to [facility] RN navigator to ensure patient does not go over allowed visits left.

## 2016-06-21 ENCOUNTER — Encounter (HOSPITAL_COMMUNITY): Payer: BLUE CROSS/BLUE SHIELD

## 2016-06-22 ENCOUNTER — Ambulatory Visit (HOSPITAL_COMMUNITY)
Admission: RE | Admit: 2016-06-22 | Discharge: 2016-06-22 | Disposition: A | Discharge status: Home / Self Care | Payer: BLUE CROSS/BLUE SHIELD | Source: Ambulatory Visit | Attending: Internal Medicine | Admitting: Internal Medicine

## 2016-06-22 ENCOUNTER — Ambulatory Visit (HOSPITAL_COMMUNITY): Payer: Self-pay | Admitting: Pharmacist

## 2016-06-22 ENCOUNTER — Ambulatory Visit (HOSPITAL_COMMUNITY)
Admission: RE | Admit: 2016-06-22 | Discharge: 2016-06-22 | Disposition: A | Discharge status: Home / Self Care | Payer: BLUE CROSS/BLUE SHIELD | Source: Ambulatory Visit | Attending: Cardiology | Admitting: Cardiology

## 2016-06-22 ENCOUNTER — Other Ambulatory Visit (HOSPITAL_COMMUNITY): Payer: Self-pay | Admitting: *Deleted

## 2016-06-22 VITALS — BP 88/0 | HR 78 | Ht 72.0 in | Wt 242.8 lb

## 2016-06-22 DIAGNOSIS — I110 Hypertensive heart disease with heart failure: Secondary | ICD-10-CM | POA: Diagnosis not present

## 2016-06-22 DIAGNOSIS — F17200 Nicotine dependence, unspecified, uncomplicated: Secondary | ICD-10-CM | POA: Diagnosis not present

## 2016-06-22 DIAGNOSIS — I2721 Secondary pulmonary arterial hypertension: Secondary | ICD-10-CM | POA: Diagnosis not present

## 2016-06-22 DIAGNOSIS — J449 Chronic obstructive pulmonary disease, unspecified: Secondary | ICD-10-CM | POA: Diagnosis not present

## 2016-06-22 DIAGNOSIS — I5023 Acute on chronic systolic (congestive) heart failure: Secondary | ICD-10-CM

## 2016-06-22 DIAGNOSIS — Z87891 Personal history of nicotine dependence: Secondary | ICD-10-CM | POA: Diagnosis not present

## 2016-06-22 DIAGNOSIS — I2510 Atherosclerotic heart disease of native coronary artery without angina pectoris: Secondary | ICD-10-CM | POA: Insufficient documentation

## 2016-06-22 DIAGNOSIS — Z95811 Presence of heart assist device: Secondary | ICD-10-CM

## 2016-06-22 DIAGNOSIS — I5043 Acute on chronic combined systolic (congestive) and diastolic (congestive) heart failure: Secondary | ICD-10-CM

## 2016-06-22 DIAGNOSIS — Z7901 Long term (current) use of anticoagulants: Secondary | ICD-10-CM

## 2016-06-22 DIAGNOSIS — I5022 Chronic systolic (congestive) heart failure: Secondary | ICD-10-CM | POA: Diagnosis not present

## 2016-06-22 DIAGNOSIS — Z79899 Other long term (current) drug therapy: Secondary | ICD-10-CM | POA: Insufficient documentation

## 2016-06-22 DIAGNOSIS — Z955 Presence of coronary angioplasty implant and graft: Secondary | ICD-10-CM | POA: Insufficient documentation

## 2016-06-22 DIAGNOSIS — I739 Peripheral vascular disease, unspecified: Secondary | ICD-10-CM | POA: Diagnosis not present

## 2016-06-22 DIAGNOSIS — J90 Pleural effusion, not elsewhere classified: Secondary | ICD-10-CM | POA: Insufficient documentation

## 2016-06-22 DIAGNOSIS — E785 Hyperlipidemia, unspecified: Secondary | ICD-10-CM | POA: Insufficient documentation

## 2016-06-22 DIAGNOSIS — E1151 Type 2 diabetes mellitus with diabetic peripheral angiopathy without gangrene: Secondary | ICD-10-CM | POA: Insufficient documentation

## 2016-06-22 DIAGNOSIS — I10 Essential (primary) hypertension: Secondary | ICD-10-CM | POA: Diagnosis not present

## 2016-06-22 DIAGNOSIS — I679 Cerebrovascular disease, unspecified: Secondary | ICD-10-CM

## 2016-06-22 DIAGNOSIS — Z95810 Presence of automatic (implantable) cardiac defibrillator: Secondary | ICD-10-CM | POA: Diagnosis not present

## 2016-06-22 DIAGNOSIS — I255 Ischemic cardiomyopathy: Secondary | ICD-10-CM | POA: Diagnosis not present

## 2016-06-22 DIAGNOSIS — Z794 Long term (current) use of insulin: Secondary | ICD-10-CM | POA: Insufficient documentation

## 2016-06-22 DIAGNOSIS — I252 Old myocardial infarction: Secondary | ICD-10-CM | POA: Insufficient documentation

## 2016-06-22 DIAGNOSIS — Z950 Presence of cardiac pacemaker: Secondary | ICD-10-CM | POA: Insufficient documentation

## 2016-06-22 DIAGNOSIS — I509 Heart failure, unspecified: Secondary | ICD-10-CM | POA: Diagnosis not present

## 2016-06-22 DIAGNOSIS — Z7982 Long term (current) use of aspirin: Secondary | ICD-10-CM | POA: Insufficient documentation

## 2016-06-22 LAB — PROTIME-INR
INR: 3.95
Prothrombin Time: 39.6 seconds — ABNORMAL HIGH (ref 11.4–15.2)

## 2016-06-22 LAB — CBC
HEMATOCRIT: 29.4 % — AB (ref 39.0–52.0)
HEMOGLOBIN: 8.9 g/dL — AB (ref 13.0–17.0)
MCH: 27.4 pg (ref 26.0–34.0)
MCHC: 30.3 g/dL (ref 30.0–36.0)
MCV: 90.5 fL (ref 78.0–100.0)
Platelets: 187 10*3/uL (ref 150–400)
RBC: 3.25 MIL/uL — ABNORMAL LOW (ref 4.22–5.81)
RDW: 18.1 % — AB (ref 11.5–15.5)
WBC: 6.9 10*3/uL (ref 4.0–10.5)

## 2016-06-22 LAB — BASIC METABOLIC PANEL
ANION GAP: 9 (ref 5–15)
BUN: 13 mg/dL (ref 6–20)
CO2: 28 mmol/L (ref 22–32)
Calcium: 8.3 mg/dL — ABNORMAL LOW (ref 8.9–10.3)
Chloride: 101 mmol/L (ref 101–111)
Creatinine, Ser: 0.88 mg/dL (ref 0.61–1.24)
GFR calc Af Amer: >60 mL/min (ref >60)
GFR calc non Af Amer: >60 mL/min (ref >60)
GLUCOSE: 107 mg/dL — AB (ref 65–99)
POTASSIUM: 3.6 mmol/L (ref 3.5–5.1)
Sodium: 138 mmol/L (ref 135–145)

## 2016-06-22 LAB — LACTATE DEHYDROGENASE: LDH: 197 U/L — ABNORMAL HIGH (ref 98–192)

## 2016-06-22 MED ORDER — EZETIMIBE 10 MG PO TABS: 10.0000 mg | ORAL_TABLET | Freq: Every day | ORAL | 3 refills | Status: DC

## 2016-06-22 MED ORDER — SPIRONOLACTONE 25 MG PO TABS: 25.0000 mg | ORAL_TABLET | Freq: Every day | ORAL | 3 refills | Status: DC

## 2016-06-22 MED ORDER — ATORVASTATIN CALCIUM 40 MG PO TABS: 40.0000 mg | ORAL_TABLET | Freq: Every day | ORAL | 3 refills | Status: DC

## 2016-06-22 MED ORDER — WARFARIN SODIUM 5 MG PO TABS: ORAL_TABLET | ORAL | 6 refills | Status: DC

## 2016-06-22 MED ORDER — TORSEMIDE 20 MG PO TABS: ORAL_TABLET | ORAL | 3 refills | Status: DC

## 2016-06-22 NOTE — Progress Notes (Addendum)
Patient presents for hospital d/c clinic f/u post HVAD implant on 05/30/16 for DT therapy per Dr. Maren Beach. Pt's primary caregiver (father) is with patient today. States he feels "pretty rough" and says he has been "taking on fluid" since beginning of this week.   Reports one VAD alarm while switching over power, thinking it "took too long" to change power source. Also reports dropping his controller a few times; once with enough force to pull off anchor. Denies any drive line exit site trauma with any of these mishaps.   Vital Signs:  Doppler Pressure: 88 Automatc BP:  94/70 (82) HR:  78 SPO2: 96 %  Weight: 242.8 lbs Last weight: 224 lbs at hospital discharge Home weights: 229 - 239 lbs   VAD Indication: Destination Therapy due to current smoking history at time of implant - Evaluation reviewed with Dr. Deeann Saint per Dr. Shirlee Latch at [facility].   VAD interrogation & Equipment Management (reviewed with Dr. Shirlee Latch): Speed: 2940 Flow: 5.6 Power: 5.6 w    Peak: 7.6 Trough: 4.3 Alarms: one low suction   Low flow alarm: 3.0 High flow alarm: 8.0 Suction: On Lavare: On  Hct: 27 -- increased to 29  I reviewed the LVAD parameters from today and compared the results to the patient's prior recorded data. LVAD interrogation was NEGATIVE for significant power changes, POSITIVE for one clinical alarm as described above. Event log downloaded and sent for review. Speed increased to 3000 RPM per Dr. Shirlee Latch for heart failure symptoms. Patient completing daily logbook. Reviewed by VAD coordinator and MD.   LVAD equipment check completed and is in good working order.   Clydie Braun, [employer] Clinical Specialist here for initial clinic visit. She reviewed event log results with [employer] engineering and found patient had one controller power up and associated pump start event on 06/17/16 at 10:43:39 during power change procedure. This is found to be associated with improper pin connections. Under Museum/gallery conservator, she "re-lubricated" controller pin connections. Pt was advised to call VAD pager if any further VAD equipment issues including VAD alarms. Pt verbalized understanding of same.   Exit Site Care: Drive line is being maintained daily by Dannielle Huh (father).Existing VAD dressing removed and site care performed using sterile technique. Drive line exit site cleaned with Chlora prep applicators x 2, allowed to dry, and gauze dressing with Aquacel silver strip re-applied. Drive line exit with partial tissue ingrowth. The velour is fully implanted at exit site. Slight redness around exit site, no drainage of foul odor. Removed distal suture today, proximal suture intact.  Stabilization device present and accurately applied. Pt denies fever or chills. Pt states they have adequate dressing supplies at home. Will continue daily dressing changes for now.   Significant Events on VAD Support:  05/31/16 - Controller power-up associated with pump start event. Controller change out performed.  06/01/16 - Controller pins lubricated per [employer] reps  06/17/16 - Controller power-up associated with pump start event while changing power source 06/22/16 - Controller pins re-lubricated per [employer]   Device:  [employer] Jude single lead Therapies: on at 200 bpm Last check: post implant  Anticoagulation: INR goal: 2 - 3 ASA 325 mg daily   BP & Labs:  Doppler BP: 88 - reflecting modified systolic  Hgb 8.9 - No S/S of bleeding. Specifically denies melena/BRBPR or nosebleeds.  LDH stable at 197 with established baseline of 190 - 250. Denies tea-colored urine. No power elevations noted on interrogation.   Patient  Instructions: 1.  Increased speed to 3000 RPM today. Call VAD pager if any VAD alarms. 2.  Increase Aldactone to 25 mg daily. 3.  Stop Lasix (Furosemide) and start Demadex (Torsemide) to 60 mg in am and 40 mg in pm. 4.  CXR today. 5.  Decrease Warfarin to 10 mg daily except 12.5 mg on  Mon/Wed 6.  Return to VAD clinic in one week.   Hessie DienerMolly Reece, RN VAD Coordinator   Office: [phone] 24/7 Emergency VAD Pager: [phone]    Advanced Heart Failure Clinic Note   PCP: Dr. Sigmund HazelLisa [name] Cardiology: Dr. Jens Somrenshaw HF Cardiology: Dr. Shirlee LatchMcLean  61 yo smoker with history of PAD, CAD, and ischemic cardiomyopathy now s/p HVAD placement presents for VAD clinic followup.  Patient was admitted in 1/18 with PNA and acute on chronic systolic CHF.  He was markedly volume overloaded and also had low cardiac output requiring dobutamine use.  He had a right pleural effusion and underwent thoracentesis.  Fluid studies suggested a parapneumonic effusion.  He had cardiac cath, showing 3 vessel disease and markedly elevated filling pressures, right and left.  He was evaluated for CABG but turned down given comorbidities and lack of venous conduits.  We were able to wean him off dobutamine and discharged him with plan for outpatient PCI once he had recovered from the current hospitalization.   Admitted 01/28/16 - 02/03/16 after presenting for elective 2 vessel PCI of the LAD and OM of the circumflex. RHC at same time showed advanced HF with low cardiac index and severely elevated pressures. Diuresed with IV lasix, metolazone, and milrinone, but refused PICC line to follow coox/CVP. Underwent thoracentesis 02/01/16 with 1.8 L out. Overall diuresed 16 lbs. Discharge weight 214 lbs.  He was admitted in 4/18 with low output HF and severe NYHA class IV symptoms, started on milrinone.  Unable to titrate off milrinone and is now on at home.  He had a right chronic transudative effusion, PleurX catheter was placed.    He had HVAD placed 05/30/16.  He was diuresed post-op and speed was increased to 3000.  It was later dropped to 2940.  Pleurx catheter was removed. He was discharged last week.  He has been taking Lasix 40 mg daily at home.  Weight is up close to 20 lbs since discharge.  Legs are swelling.  He is still  short of breath walking up a hill or incline, does ok on flat ground.  Breathing is better than prior to admission.  No lightheadedness.  No chest pain.  MAP 88.   On HVAD interrogation, there was 1 suction event earlier this week.  Lavare cycle is on.   Labs (1/18): K 4.2, creatinine 0.9, hgb 12.6 Labs (2/18): K 4.2, creatinine 1.23, hgb 12.4, BNP 321 Labs (4/18): hgbA1c 8.9, TSH elevated but free T4 normal.  K 3.2, creatinine 0.8 Labs (5/18): digoxin 0.4, K 3.4, creatinine 0.9 Labs (6/18): creatinine 0.67, LDH 209, hgb 8.9  HVAD interrogation: See LVAD nurse's note above  PMH: 1. PAD: Left fem-pop bypass with graft in 2017.  2. CAD: Anterior MI 2005 => PCI to LAD followed by staged PCI to LCx and RCA.   - LHC (1/18): Diffuse up to 80% calcified stenosis throughout the proximal LAD.  Diffuse disease in distal LAD up to 50%.  Large OM1 totally occluded at the ostium but reconstituted via collaterals.  80% distal RCA stenosis involving the ostia of the PLV and PDA with about 70% stenosis. Patient had PCI  with DES to proximal LAD and PTCA OM1 (unable to place stent).  3. Chronic systolic CHF: Ischemic cardiomyopathy.  St Jude ICD.  - Echo (1/18) with EF 20%, mildly decreased RV systolic function.  - RHC (1/18) with mean RA 21, PA 69/31 mean 45, mean PCWP 27, CI 2.37, PVR 3.3 WU.  - Repeat RHC (1/18) with mean RA 16, mean PCWP 21, CI 1.92 - Echo (3/18) with EF 20-25%, mild MR, mild RV dilation with normal systolic function, PASP 47 mmHg.  - 4/18 admission with low output HF and milrinone gtt started (co-ox as low as 42%).  - RHC on milrinone (4/18): mean RA 5, PA 61/24 mean 40, mean PCWP 17, CI 2.1 Fick with PVR 5.1 WU, CI 1.84 thermo with PVR 5.9 WU.  - HVAD placement 05/30/16.  4. Carotid stenosis: Carotid dopplers (8/16) with 60-79% RICA, 40-59% LICA.   - Carotid dopplers (2/18) with 40-59% RICA stenosis.  5. Type II diabetes. 6. Hyperlipidemia. 7. HTN 8. COPD:  PFTs (5/18) with moderate  to severe obstructive disease.  Active smoker. 9. Chronic right pleural effusion (transudative) with PleurX catheter.   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  He has quit smoking.  Occasional ETOH.   Family History  Problem Relation Age of Onset  . Diabetes Mother   . Coronary artery disease Father   . Heart disease Father        before age 88   ROS: All systems reviewed and negative except as per HPI.   Current Outpatient Prescriptions  Medication Sig Dispense Refill  . amiodarone (PACERONE) 200 MG tablet Take 1 tablet (200 mg total) by mouth daily. 30 tablet 6  . aspirin 325 MG EC tablet Take 1 tablet (325 mg total) by mouth daily. 30 tablet 6  . ferrous fumarate-b12-vitamic C-folic acid (TRINSICON / FOLTRIN) capsule Take 1 capsule by mouth 2 (two) times daily between meals. 60 capsule 6  . fexofenadine (ALLEGRA) 180 MG tablet Take 180 mg by mouth every morning.    . insulin NPH-regular Human (NOVOLIN 70/30) (70-30) 100 UNIT/ML injection Inject 15-30 Units into the skin 3 (three) times daily. 30 units in the morning, 15-20 units in the afternoon, 15-20 units in the evening    . losartan (COZAAR) 25 MG tablet Take 1 tablet (25 mg total) by mouth 2 (two) times daily. 60 tablet 6  . pantoprazole (PROTONIX) 40 MG tablet Take 1 tablet (40 mg total) by mouth daily. 30 tablet 6  . sildenafil (REVATIO) 20 MG tablet Take 1 tablet (20 mg total) by mouth 3 (three) times daily. 90 tablet 11  . warfarin (COUMADIN) 5 MG tablet 10 mg by mouth daily except 12.5 mg on Mondays and Wednesdays 90 tablet 6  . atorvastatin (LIPITOR) 40 MG tablet Take 1 tablet (40 mg total) by mouth daily. 90 tablet 3  . ezetimibe (ZETIA) 10 MG tablet Take 1 tablet (10 mg total) by mouth daily. 90 tablet 3  . spironolactone (ALDACTONE) 25 MG tablet Take 1 tablet (25 mg total) by mouth daily. 90 tablet 3  . torsemide (DEMADEX) 20 MG tablet Take 60 mg twice daily or as directed 180 tablet 3   No current  facility-administered medications for this encounter.    BP (!) 88/0 Comment: Doppler MAP - pt has LVAD  Pulse 78   Ht 6' (1.829 m)   Wt 242 lb 12.8 oz (110.1 kg)   SpO2 95%   BMI 32.93 kg/m  MAP 88  Wt Readings from Last 3 Encounters:  06/22/16 242 lb 12.8 oz (110.1 kg)  06/13/16 224 lb 8 oz (101.8 kg)  05/24/16 211 lb (95.7 kg)    General: NAD Neck: JVP 12 cm, no thyromegaly or thyroid nodule.  Lungs: Slightly decreased breath sounds right base.   CV: LVAD hum. Unable to palpate pedal pulses.  1+ edema 1/2 to knees bilaterally. Abdomen: Soft, NT, ND, no HSM. No bruits or masses. +BS  Skin:  Changes of venous stasis dermatitis noted.  Driveline site benign. Bruising noted on arms.  Neurologic: Alert and oriented x 3.  Psych: Normal affect. Extremities: No clubbing or cyanosis.  HEENT: Normal.   Assessment/Plan: 1. Chronic systolic CHF: Ischemic cardiomyopathy.  Echo in 3/18 with EF 20-25%.  He did not have much effect from 12/17 PCI in terms of symptoms or EF.  St Jude ICD.  He is now s/p HVAD placement.  HVAD parameters are stable (1 isolated suction event a few days ago). Lavare cycle is on.  He is volume overloaded on exam today with NYHA class IIIa symptoms.  He feels better, but not markedly better, than prior to HVAD placement.  - Stop Lasix, start torsemide 60 qam/40 qpm. BMET 1 week.  - Goal MAP 85 or below => Continue losartan 25 mg bid and increase spironolactone to 25 mg daily.  - Increase speed to 3000 rpm today.  2. CAD: 3 vessel CAD, s/p stent to proximal LAD and PTCA to subtotally occluded large OM1 in 1/18. No change in symptoms or EF with intervention.  - He was on Plavix for > 3 months post-intervention, now on warfarin + ASA.  - Continue atorvastatin 80 mg daily.   3. PAD: Stable claudication, notes walking up a hill in past.  - He follows with [facility]. No change to current plans.  4. Carotid stenosis:  Repeat carotid dopplers in 2/19.  5. R Pleural effusion:  PleurX catheter removed prior to last discharge.  I will arrange for CXR PA/lateral.   6. Pulmonary arterial HTN: Continue Revatio 20 mg tid.  7. Smoking: He has quit.  8. Anticoagulation: Continue ASA 325 mg daily with warfarin INR goal 2-3.    Marca Ancona 06/22/2016

## 2016-06-22 NOTE — Patient Instructions (Addendum)
1.  Increased speed to 3000 RPM today. Call VAD pager if any VAD alarms. 2.  Increase Aldactone to 25 mg daily. 3.  Stop Lasix (Furosemide) and start Demadex (Torsemide) to 60 mg in am and 40 mg in pm. 4.  CXR today. 5.  Decrease Warfarin to 10 mg daily except 12.5 mg on Mon/Wed 6.  Return to VAD clinic in one week.

## 2016-06-23 ENCOUNTER — Encounter (HOSPITAL_COMMUNITY): Payer: BLUE CROSS/BLUE SHIELD

## 2016-06-26 ENCOUNTER — Encounter (HOSPITAL_COMMUNITY): Payer: BLUE CROSS/BLUE SHIELD

## 2016-06-27 ENCOUNTER — Other Ambulatory Visit (HOSPITAL_COMMUNITY): Payer: Self-pay | Admitting: *Deleted

## 2016-06-27 DIAGNOSIS — I5043 Acute on chronic combined systolic (congestive) and diastolic (congestive) heart failure: Secondary | ICD-10-CM

## 2016-06-27 DIAGNOSIS — Z7901 Long term (current) use of anticoagulants: Secondary | ICD-10-CM

## 2016-06-27 DIAGNOSIS — Z95811 Presence of heart assist device: Secondary | ICD-10-CM

## 2016-06-28 ENCOUNTER — Ambulatory Visit (HOSPITAL_COMMUNITY)
Admission: RE | Admit: 2016-06-28 | Discharge: 2016-06-28 | Disposition: A | Discharge status: Home / Self Care | Payer: BLUE CROSS/BLUE SHIELD | Source: Ambulatory Visit | Attending: Internal Medicine | Admitting: Internal Medicine

## 2016-06-28 ENCOUNTER — Encounter (HOSPITAL_COMMUNITY): Payer: BLUE CROSS/BLUE SHIELD

## 2016-06-28 ENCOUNTER — Ambulatory Visit (HOSPITAL_COMMUNITY): Payer: Self-pay | Admitting: Pharmacist

## 2016-06-28 ENCOUNTER — Telehealth (HOSPITAL_COMMUNITY): Payer: Self-pay

## 2016-06-28 VITALS — BP 80/0 | HR 75 | Ht 72.0 in | Wt 238.2 lb

## 2016-06-28 DIAGNOSIS — Z95811 Presence of heart assist device: Secondary | ICD-10-CM

## 2016-06-28 DIAGNOSIS — Z7982 Long term (current) use of aspirin: Secondary | ICD-10-CM | POA: Diagnosis not present

## 2016-06-28 DIAGNOSIS — I2721 Secondary pulmonary arterial hypertension: Secondary | ICD-10-CM | POA: Diagnosis not present

## 2016-06-28 DIAGNOSIS — F17200 Nicotine dependence, unspecified, uncomplicated: Secondary | ICD-10-CM | POA: Diagnosis not present

## 2016-06-28 DIAGNOSIS — I509 Heart failure, unspecified: Secondary | ICD-10-CM | POA: Diagnosis not present

## 2016-06-28 DIAGNOSIS — R358 Other polyuria: Secondary | ICD-10-CM

## 2016-06-28 DIAGNOSIS — I5023 Acute on chronic systolic (congestive) heart failure: Secondary | ICD-10-CM | POA: Diagnosis not present

## 2016-06-28 DIAGNOSIS — I679 Cerebrovascular disease, unspecified: Secondary | ICD-10-CM | POA: Diagnosis not present

## 2016-06-28 DIAGNOSIS — I10 Essential (primary) hypertension: Secondary | ICD-10-CM

## 2016-06-28 DIAGNOSIS — E785 Hyperlipidemia, unspecified: Secondary | ICD-10-CM | POA: Insufficient documentation

## 2016-06-28 DIAGNOSIS — I2510 Atherosclerotic heart disease of native coronary artery without angina pectoris: Secondary | ICD-10-CM | POA: Diagnosis not present

## 2016-06-28 DIAGNOSIS — Z79899 Other long term (current) drug therapy: Secondary | ICD-10-CM | POA: Insufficient documentation

## 2016-06-28 DIAGNOSIS — E1151 Type 2 diabetes mellitus with diabetic peripheral angiopathy without gangrene: Secondary | ICD-10-CM | POA: Insufficient documentation

## 2016-06-28 DIAGNOSIS — Z7901 Long term (current) use of anticoagulants: Secondary | ICD-10-CM | POA: Diagnosis not present

## 2016-06-28 DIAGNOSIS — I252 Old myocardial infarction: Secondary | ICD-10-CM | POA: Diagnosis not present

## 2016-06-28 DIAGNOSIS — Z87891 Personal history of nicotine dependence: Secondary | ICD-10-CM | POA: Insufficient documentation

## 2016-06-28 DIAGNOSIS — Z95810 Presence of automatic (implantable) cardiac defibrillator: Secondary | ICD-10-CM

## 2016-06-28 DIAGNOSIS — J449 Chronic obstructive pulmonary disease, unspecified: Secondary | ICD-10-CM | POA: Diagnosis not present

## 2016-06-28 DIAGNOSIS — J90 Pleural effusion, not elsewhere classified: Secondary | ICD-10-CM | POA: Diagnosis not present

## 2016-06-28 DIAGNOSIS — Z955 Presence of coronary angioplasty implant and graft: Secondary | ICD-10-CM | POA: Insufficient documentation

## 2016-06-28 DIAGNOSIS — I5043 Acute on chronic combined systolic (congestive) and diastolic (congestive) heart failure: Secondary | ICD-10-CM | POA: Diagnosis not present

## 2016-06-28 DIAGNOSIS — Z794 Long term (current) use of insulin: Secondary | ICD-10-CM | POA: Diagnosis not present

## 2016-06-28 DIAGNOSIS — I6529 Occlusion and stenosis of unspecified carotid artery: Secondary | ICD-10-CM | POA: Insufficient documentation

## 2016-06-28 DIAGNOSIS — R3589 Other polyuria: Secondary | ICD-10-CM

## 2016-06-28 DIAGNOSIS — I5022 Chronic systolic (congestive) heart failure: Secondary | ICD-10-CM | POA: Diagnosis not present

## 2016-06-28 DIAGNOSIS — I110 Hypertensive heart disease with heart failure: Secondary | ICD-10-CM | POA: Insufficient documentation

## 2016-06-28 DIAGNOSIS — I255 Ischemic cardiomyopathy: Secondary | ICD-10-CM | POA: Diagnosis not present

## 2016-06-28 DIAGNOSIS — I739 Peripheral vascular disease, unspecified: Secondary | ICD-10-CM

## 2016-06-28 LAB — CBC
HEMATOCRIT: 29.4 % — AB (ref 39.0–52.0)
HEMOGLOBIN: 9 g/dL — AB (ref 13.0–17.0)
MCH: 27.8 pg (ref 26.0–34.0)
MCHC: 30.6 g/dL (ref 30.0–36.0)
MCV: 90.7 fL (ref 78.0–100.0)
Platelets: 161 10*3/uL (ref 150–400)
RBC: 3.24 MIL/uL — AB (ref 4.22–5.81)
RDW: 18 % — ABNORMAL HIGH (ref 11.5–15.5)
WBC: 7 10*3/uL (ref 4.0–10.5)

## 2016-06-28 LAB — COMPREHENSIVE METABOLIC PANEL
ALT: 26 U/L (ref 17–63)
AST: 27 U/L (ref 15–41)
Albumin: 2.8 g/dL — ABNORMAL LOW (ref 3.5–5.0)
Alkaline Phosphatase: 173 U/L — ABNORMAL HIGH (ref 38–126)
Anion gap: 9 (ref 5–15)
BUN: 15 mg/dL (ref 6–20)
CHLORIDE: 98 mmol/L — AB (ref 101–111)
CO2: 30 mmol/L (ref 22–32)
CREATININE: 0.93 mg/dL (ref 0.61–1.24)
Calcium: 8.4 mg/dL — ABNORMAL LOW (ref 8.9–10.3)
GFR calc non Af Amer: >60 mL/min (ref >60)
GLUCOSE: 158 mg/dL — AB (ref 65–99)
Potassium: 3.7 mmol/L (ref 3.5–5.1)
SODIUM: 137 mmol/L (ref 135–145)
Total Bilirubin: 0.4 mg/dL (ref 0.3–1.2)
Total Protein: 7.4 g/dL (ref 6.5–8.1)

## 2016-06-28 LAB — LACTATE DEHYDROGENASE: LDH: 207 U/L — AB (ref 98–192)

## 2016-06-28 LAB — PROTIME-INR
INR: 3.09
Prothrombin Time: 32.5 seconds — ABNORMAL HIGH (ref 11.4–15.2)

## 2016-06-28 MED ORDER — TORSEMIDE 20 MG PO TABS: ORAL_TABLET | ORAL | 3 refills | Status: DC

## 2016-06-28 MED ORDER — POTASSIUM CHLORIDE CRYS ER 20 MEQ PO TBCR: 20.0000 meq | EXTENDED_RELEASE_TABLET | Freq: Every day | ORAL | 3 refills | Status: DC

## 2016-06-28 NOTE — Patient Instructions (Signed)
1.  Increase Torsemide 80 mg twice daily 2.  Decrease Asprin to 161 mg daily (either 2 baby aspirin or 1/2 of 325 mg tab) 3.  Start potassium 20 meq daily 4.  Advance dressing changes to every other day 5.  Return to VAD clinic in one week 6.  May start driving; have your father with you first few drives.

## 2016-06-28 NOTE — Telephone Encounter (Signed)
[facility] request itemized hospital bill from patient's LVAD implant admission for disability claim. Asked to call this RN back at CHF clinic or fax a request form.  [name], [name], RN

## 2016-06-28 NOTE — Progress Notes (Addendum)
Patient presents for one week clinic f/u post HVAD implant on 05/30/16 for DT therapy per Dr. Maren BeachVanTrigt. Pt's primary caregiver (father) is with patient today. Pt states he feels better today; states he stopped Lasix and started Torsemide. He reports he has been taking 80 mg/60 mg daily with home wts 232 - 236 lbs.  Pt states he is adding distance to his daily walks and he has added incline to daily walks. He does report leg cramps at 100 yds.   Reports one VAD alarm, but "doesn't know what it was". Denies any drive line exit site trauma with any of these mishaps.   Vital Signs:  Doppler Pressure: 80 Automatc BP:  86/63 (74) HR: 75 SPO2: unable to pick up  Weight: 238.2  lbs Last weight: 242.8 lbs Home weights: 232 - 236 bs   VAD Indication: Destination Therapy due to current smoking history at time of implant - Evaluation reviewed with Dr. Deeann Saintevone per Dr[facility] at [facility].   VAD interrogation & Equipment Management (reviewed with Dr. Shirlee LatchMcLean): Speed: 3000 Flow: 6.3 Power: 6.1 w    Peak: 7.9 Trough: 4.9 Alarms: one low suction on 06/23/16 at 9 am  Low flow alarm: 3.0 High flow alarm: 8.0 Suction: On Lavare: On  Hct: 29  I reviewed the LVAD parameters from today and compared the results to the patient's prior recorded data. LVAD interrogation was NEGATIVE for significant power changes, POSITIVE for one clinical alarm as described above. Event log downloaded and sent for review. Speed increased to 3000 RPM per Dr. Shirlee LatchMcLean for heart failure symptoms. Patient completing daily logbook. Reviewed by VAD coordinator and MD.   LVAD equipment check completed and is in good working order.    Exit Site Care: Drive line is being maintained daily by Dannielle HuhDanny (father).Existing VAD dressing removed and site care performed using sterile technique. Drive line exit site cleaned with Chlora prep applicators x 2, allowed to dry, and gauze dressing with Aquacel silver strip re-applied. Drive line exit  with partial tissue ingrowth. The velour is fully implanted at exit site. Slight redness around exit site, no drainage of foul odor. Removed proximal suture today. Stabilization device present and accurately applied. Pt denies fever or chills. Pt states they have adequate dressing supplies at home. Will advance to every other day dressing changes for now.  Dr. Maren BeachVanTrigt in and assessed exit site. He removed sutures for right pleural drain site.   Significant Events on VAD Support:  05/31/16 - Controller power-up associated with pump start event. Controller change out performed.  06/01/16 - Controller pins lubricated per [employer] Jude reps  06/17/16 - Controller power-up associated with pump start event while changing power source 06/22/16 - Controller pins re-lubricated per [employer] reps   Device:  St Jude single lead Therapies: on at 200 bpm Last check: post implant  Anticoagulation: INR goal: 2 - 3 ASA 325 mg daily   BP & Labs:  Doppler BP 80  Hgb 9.0 - No S/S of bleeding. Specifically denies melena/BRBPR or nosebleeds.  LDH stable at  with established baseline of 190 - 250. Denies tea-colored urine. No power elevations noted on interrogation.   Patient Instructions: 1.  Increase Torsemide 80 mg twice daily 2.  Decrease Asprin to 161 mg daily (either 2 baby aspirin or 1/2 of 325 mg tab) 3.  Start potassium 20 meq daily 4.  Advance dressing changes to every other day 5.  Return to VAD clinic in one week 6.  May start  driving; have your father with you first few drives.   Hessie Diener, RN VAD Coordinator   Office: [phone] 24/7 Emergency VAD Pager: [phone]     Advanced Heart Failure Clinic Note   PCP: Dr. Sigmund Hazel Cardiology: Dr. Jens Som HF Cardiology: Dr. Shirlee Latch  61 yo smoker with history of PAD, CAD, and ischemic cardiomyopathy now s/p HVAD placement presents for VAD clinic followup.  Patient was admitted in 1/18 with PNA and acute on chronic systolic CHF.  He was  markedly volume overloaded and also had low cardiac output requiring dobutamine use.  He had a right pleural effusion and underwent thoracentesis.  Fluid studies suggested a parapneumonic effusion.  He had cardiac cath, showing 3 vessel disease and markedly elevated filling pressures, right and left.  He was evaluated for CABG but turned down given comorbidities and lack of venous conduits.  We were able to wean him off dobutamine and discharged him with plan for outpatient PCI once he had recovered from the current hospitalization.   Admitted 01/28/16 - 02/03/16 after presenting for elective 2 vessel PCI of the LAD and OM of the circumflex. RHC at same time showed advanced HF with low cardiac index and severely elevated pressures. Diuresed with IV lasix, metolazone, and milrinone, but refused PICC line to follow coox/CVP. Underwent thoracentesis 02/01/16 with 1.8 L out. Overall diuresed 16 lbs. Discharge weight 214 lbs.  He was admitted in 4/18 with low output HF and severe NYHA class IV symptoms, started on milrinone.  Unable to titrate off milrinone and is now on at home.  He had a right chronic transudative effusion, PleurX catheter was placed.    He had HVAD placed 05/30/16.  He was diuresed post-op and speed was increased to 3000.  It was later dropped to 2940.  Pleurx catheter was removed.  At last appointment, he was volume overloaded.  Speed was increased to 3000 rpm and he was taken off Lasix and transitioned to torsemide.  Weight is down 5 lbs.  He has been feeling better.  He is walking farther.  Mild dyspnea walking up a hill.  He has calf claudication bilaterally after walking a moderate distance.  This is chronic and followed by [facility].  MAP is 74 today.    On HVAD interrogation, there was 1 suction event since last appointment.  Lavare cycle is on.   Labs (1/18): K 4.2, creatinine 0.9, hgb 12.6 Labs (2/18): K 4.2, creatinine 1.23, hgb 12.4, BNP 321 Labs (4/18): hgbA1c 8.9, TSH elevated but free  T4 normal.  K 3.2, creatinine 0.8 Labs (5/18): digoxin 0.4, K 3.4, creatinine 0.9 Labs (6/18): creatinine 0.67 => 0.98, LDH 209, hgb 8.9 => 9  HVAD interrogation: See LVAD nurse's note above  PMH: 1. PAD: Left fem-pop bypass with graft in 2017.  2. CAD: Anterior MI 2005 => PCI to LAD followed by staged PCI to LCx and RCA.   - LHC (1/18): Diffuse up to 80% calcified stenosis throughout the proximal LAD.  Diffuse disease in distal LAD up to 50%.  Large OM1 totally occluded at the ostium but reconstituted via collaterals.  80% distal RCA stenosis involving the ostia of the PLV and PDA with about 70% stenosis. Patient had PCI with DES to proximal LAD and PTCA OM1 (unable to place stent).  3. Chronic systolic CHF: Ischemic cardiomyopathy.  St Jude ICD.  - Echo (1/18) with EF 20%, mildly decreased RV systolic function.  - RHC (1/18) with mean RA 21, PA 69/31 mean 45,  mean PCWP 27, CI 2.37, PVR 3.3 WU.  - Repeat RHC (1/18) with mean RA 16, mean PCWP 21, CI 1.92 - Echo (3/18) with EF 20-25%, mild MR, mild RV dilation with normal systolic function, PASP 47 mmHg.  - 4/18 admission with low output HF and milrinone gtt started (co-ox as low as 42%).  - RHC on milrinone (4/18): mean RA 5, PA 61/24 mean 40, mean PCWP 17, CI 2.1 Fick with PVR 5.1 WU, CI 1.84 thermo with PVR 5.9 WU.  - HVAD placement 05/30/16.  4. Carotid stenosis: Carotid dopplers (8/16) with 60-79% RICA, 40-59% LICA.   - Carotid dopplers (2/18) with 40-59% RICA stenosis.  5. Type II diabetes. 6. Hyperlipidemia. 7. HTN 8. COPD:  PFTs (5/18) with moderate to severe obstructive disease.  Active smoker. 9. Chronic right pleural effusion (transudative) with PleurX catheter.   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  He has quit smoking.  Occasional ETOH.   Family History  Problem Relation Age of Onset  . Diabetes Mother   . Coronary artery disease Father   . Heart disease Father        before age 47   ROS: All systems  reviewed and negative except as per HPI.   Current Outpatient Prescriptions  Medication Sig Dispense Refill  . amiodarone (PACERONE) 200 MG tablet Take 1 tablet (200 mg total) by mouth daily. 30 tablet 6  . aspirin 325 MG EC tablet Take 1 tablet (325 mg total) by mouth daily. 30 tablet 6  . atorvastatin (LIPITOR) 40 MG tablet Take 1 tablet (40 mg total) by mouth daily. 90 tablet 3  . ezetimibe (ZETIA) 10 MG tablet Take 1 tablet (10 mg total) by mouth daily. 90 tablet 3  . ferrous fumarate-b12-vitamic C-folic acid (TRINSICON / FOLTRIN) capsule Take 1 capsule by mouth 2 (two) times daily between meals. 60 capsule 6  . fexofenadine (ALLEGRA) 180 MG tablet Take 180 mg by mouth every morning.    . insulin NPH-regular Human (NOVOLIN 70/30) (70-30) 100 UNIT/ML injection Inject 15-30 Units into the skin 3 (three) times daily. 30 units in the morning, 15-20 units in the afternoon, 15-20 units in the evening    . losartan (COZAAR) 25 MG tablet Take 1 tablet (25 mg total) by mouth 2 (two) times daily. 60 tablet 6  . pantoprazole (PROTONIX) 40 MG tablet Take 1 tablet (40 mg total) by mouth daily. 30 tablet 6  . sildenafil (REVATIO) 20 MG tablet Take 1 tablet (20 mg total) by mouth 3 (three) times daily. 90 tablet 11  . spironolactone (ALDACTONE) 25 MG tablet Take 1 tablet (25 mg total) by mouth daily. 90 tablet 3  . torsemide (DEMADEX) 20 MG tablet Take 80 mg twice daily or as directed 180 tablet 3  . warfarin (COUMADIN) 5 MG tablet 10 mg by mouth daily except 12.5 mg on Mondays and Wednesdays 90 tablet 6  . potassium chloride SA (K-DUR,KLOR-CON) 20 MEQ tablet Take 1 tablet (20 mEq total) by mouth daily. 90 tablet 3   No current facility-administered medications for this encounter.    BP (!) 80/0 Comment: Doppler MAP - pt has LVAD  Pulse 75   Ht 6' (1.829 m)   Wt 238 lb 3.2 oz (108 kg)   BMI 32.31 kg/m   MAP 88  Wt Readings from Last 3 Encounters:  06/28/16 238 lb 3.2 oz (108 kg)  06/22/16 242 lb  12.8 oz (110.1 kg)  06/13/16 224  lb 8 oz (101.8 kg)    General: NAD Neck: JVP 10 cm, no thyromegaly or thyroid nodule.  Lungs: Slightly decreased breath sounds right base.   CV: LVAD hum. Unable to palpate pedal pulses.  1+ edema knees bilaterally. Abdomen: Soft, NT, ND, no HSM. No bruits or masses. +BS  Skin:  Changes of venous stasis dermatitis noted.  Driveline site benign. Bruising noted on arms.  Neurologic: Alert and oriented x 3.  Psych: Normal affect. Extremities: No clubbing or cyanosis.  HEENT: Normal.   Assessment/Plan: 1. Chronic systolic CHF: Ischemic cardiomyopathy.  Echo in 3/18 with EF 20-25%.  He did not have much effect from 12/17 PCI in terms of symptoms or EF.  St Jude ICD.  He is now s/p HVAD placement.  HVAD parameters are stable (1 isolated suction event since last appointment). Lavare cycle is on.  He is still volume overloaded but improved compared to last appointment.  Weight is down 5 lbs.  He is walking more.  MAP is at goal.  - Increase torsemide to 80 mg bid and add KCl 20 daily.  - Continue losartan 25 mg bid and spironolactone 25 mg daily.  - Keep speed at 3000 rpm.   - INR has been around 3, he bruises easily.  Will adjust coumadin for 2-3 and decrease ASA to 162 mg daily. - May start driving if ok with Dr. Donata Clay.  Will refer to cardiac rehab.  - He is going to think about whether he would eventually want to have transplant evaluation.  If he does, will need to check nicotine levels.  2. CAD: 3 vessel CAD, s/p stent to proximal LAD and PTCA to subtotally occluded large OM1 in 1/18. No change in symptoms or EF with intervention.  - He was on Plavix for > 3 months post-intervention, now on warfarin + ASA.  - Continue atorvastatin 80 mg daily.   3. PAD: Stable claudication, notes with moderate walking.   - He follows with [facility], has appt in July. - I encouraged him to try walking through the pain for at least a short distance.   4. Carotid stenosis:   Followed at [facility].  5. R Pleural effusion: PleurX catheter removed prior to last discharge.  6. Pulmonary arterial HTN: Continue Revatio 20 mg tid.  7. Smoking: He has quit.    Marca Ancona 06/28/2016

## 2016-06-30 ENCOUNTER — Encounter (HOSPITAL_COMMUNITY): Payer: BLUE CROSS/BLUE SHIELD

## 2016-07-03 ENCOUNTER — Encounter (HOSPITAL_COMMUNITY): Payer: BLUE CROSS/BLUE SHIELD

## 2016-07-05 ENCOUNTER — Encounter (HOSPITAL_COMMUNITY): Payer: BLUE CROSS/BLUE SHIELD

## 2016-07-07 ENCOUNTER — Inpatient Hospital Stay (HOSPITAL_COMMUNITY)
Admission: AD | Admit: 2016-07-07 | Discharge: 2016-07-19 | DRG: 378 | Disposition: A | Discharge status: Home / Self Care | Payer: BLUE CROSS/BLUE SHIELD | Source: Ambulatory Visit | Attending: Cardiology | Admitting: Cardiology

## 2016-07-07 ENCOUNTER — Ambulatory Visit (HOSPITAL_BASED_OUTPATIENT_CLINIC_OR_DEPARTMENT_OTHER)
Admission: RE | Admit: 2016-07-07 | Discharge: 2016-07-07 | Disposition: A | Discharge status: Home / Self Care | Payer: BLUE CROSS/BLUE SHIELD | Source: Ambulatory Visit | Attending: Cardiology | Admitting: Cardiology

## 2016-07-07 ENCOUNTER — Encounter (HOSPITAL_COMMUNITY): Payer: BLUE CROSS/BLUE SHIELD

## 2016-07-07 ENCOUNTER — Telehealth (HOSPITAL_COMMUNITY): Payer: Self-pay

## 2016-07-07 ENCOUNTER — Encounter (HOSPITAL_COMMUNITY): Payer: Self-pay

## 2016-07-07 ENCOUNTER — Ambulatory Visit (HOSPITAL_COMMUNITY): Payer: Self-pay | Admitting: Pharmacist

## 2016-07-07 VITALS — BP 84/57 | HR 91 | Ht 72.0 in | Wt 230.2 lb

## 2016-07-07 DIAGNOSIS — Z95811 Presence of heart assist device: Secondary | ICD-10-CM

## 2016-07-07 DIAGNOSIS — I5023 Acute on chronic systolic (congestive) heart failure: Secondary | ICD-10-CM

## 2016-07-07 DIAGNOSIS — Z888 Allergy status to other drugs, medicaments and biological substances status: Secondary | ICD-10-CM

## 2016-07-07 DIAGNOSIS — I509 Heart failure, unspecified: Secondary | ICD-10-CM

## 2016-07-07 DIAGNOSIS — I270 Primary pulmonary hypertension: Secondary | ICD-10-CM

## 2016-07-07 DIAGNOSIS — I493 Ventricular premature depolarization: Secondary | ICD-10-CM

## 2016-07-07 DIAGNOSIS — D62 Acute posthemorrhagic anemia: Secondary | ICD-10-CM | POA: Diagnosis present

## 2016-07-07 DIAGNOSIS — I6523 Occlusion and stenosis of bilateral carotid arteries: Secondary | ICD-10-CM | POA: Diagnosis present

## 2016-07-07 DIAGNOSIS — I5022 Chronic systolic (congestive) heart failure: Secondary | ICD-10-CM | POA: Diagnosis present

## 2016-07-07 DIAGNOSIS — F17200 Nicotine dependence, unspecified, uncomplicated: Secondary | ICD-10-CM | POA: Diagnosis present

## 2016-07-07 DIAGNOSIS — T829XXA Unspecified complication of cardiac and vascular prosthetic device, implant and graft, initial encounter: Secondary | ICD-10-CM | POA: Diagnosis not present

## 2016-07-07 DIAGNOSIS — F17210 Nicotine dependence, cigarettes, uncomplicated: Secondary | ICD-10-CM | POA: Diagnosis present

## 2016-07-07 DIAGNOSIS — K922 Gastrointestinal hemorrhage, unspecified: Secondary | ICD-10-CM | POA: Diagnosis not present

## 2016-07-07 DIAGNOSIS — Z87442 Personal history of urinary calculi: Secondary | ICD-10-CM

## 2016-07-07 DIAGNOSIS — K2270 Barrett's esophagus without dysplasia: Secondary | ICD-10-CM | POA: Diagnosis present

## 2016-07-07 DIAGNOSIS — E785 Hyperlipidemia, unspecified: Secondary | ICD-10-CM | POA: Diagnosis present

## 2016-07-07 DIAGNOSIS — Z794 Long term (current) use of insulin: Secondary | ICD-10-CM

## 2016-07-07 DIAGNOSIS — I252 Old myocardial infarction: Secondary | ICD-10-CM | POA: Diagnosis not present

## 2016-07-07 DIAGNOSIS — E876 Hypokalemia: Secondary | ICD-10-CM | POA: Diagnosis not present

## 2016-07-07 DIAGNOSIS — Z833 Family history of diabetes mellitus: Secondary | ICD-10-CM

## 2016-07-07 DIAGNOSIS — I480 Paroxysmal atrial fibrillation: Secondary | ICD-10-CM | POA: Diagnosis present

## 2016-07-07 DIAGNOSIS — J449 Chronic obstructive pulmonary disease, unspecified: Secondary | ICD-10-CM | POA: Diagnosis present

## 2016-07-07 DIAGNOSIS — Z7901 Long term (current) use of anticoagulants: Secondary | ICD-10-CM | POA: Diagnosis not present

## 2016-07-07 DIAGNOSIS — Z955 Presence of coronary angioplasty implant and graft: Secondary | ICD-10-CM

## 2016-07-07 DIAGNOSIS — D649 Anemia, unspecified: Secondary | ICD-10-CM | POA: Diagnosis not present

## 2016-07-07 DIAGNOSIS — Z95810 Presence of automatic (implantable) cardiac defibrillator: Secondary | ICD-10-CM | POA: Diagnosis not present

## 2016-07-07 DIAGNOSIS — I2721 Secondary pulmonary arterial hypertension: Secondary | ICD-10-CM | POA: Diagnosis present

## 2016-07-07 DIAGNOSIS — I739 Peripheral vascular disease, unspecified: Secondary | ICD-10-CM | POA: Diagnosis present

## 2016-07-07 DIAGNOSIS — E1151 Type 2 diabetes mellitus with diabetic peripheral angiopathy without gangrene: Secondary | ICD-10-CM | POA: Diagnosis present

## 2016-07-07 DIAGNOSIS — I255 Ischemic cardiomyopathy: Secondary | ICD-10-CM | POA: Diagnosis present

## 2016-07-07 DIAGNOSIS — Z7982 Long term (current) use of aspirin: Secondary | ICD-10-CM | POA: Diagnosis not present

## 2016-07-07 DIAGNOSIS — E1165 Type 2 diabetes mellitus with hyperglycemia: Secondary | ICD-10-CM | POA: Diagnosis not present

## 2016-07-07 DIAGNOSIS — I110 Hypertensive heart disease with heart failure: Secondary | ICD-10-CM | POA: Diagnosis present

## 2016-07-07 DIAGNOSIS — I2510 Atherosclerotic heart disease of native coronary artery without angina pectoris: Secondary | ICD-10-CM | POA: Diagnosis present

## 2016-07-07 DIAGNOSIS — Z79899 Other long term (current) drug therapy: Secondary | ICD-10-CM

## 2016-07-07 DIAGNOSIS — E782 Mixed hyperlipidemia: Secondary | ICD-10-CM | POA: Diagnosis present

## 2016-07-07 DIAGNOSIS — K31811 Angiodysplasia of stomach and duodenum with bleeding: Principal | ICD-10-CM | POA: Diagnosis present

## 2016-07-07 LAB — CBC
HCT: 22.5 % — ABNORMAL LOW (ref 39.0–52.0)
Hemoglobin: 7 g/dL — ABNORMAL LOW (ref 13.0–17.0)
MCH: 27.9 pg (ref 26.0–34.0)
MCHC: 31.1 g/dL (ref 30.0–36.0)
MCV: 89.6 fL (ref 78.0–100.0)
PLATELETS: 192 10*3/uL (ref 150–400)
RBC: 2.51 MIL/uL — AB (ref 4.22–5.81)
RDW: 17.9 % — AB (ref 11.5–15.5)
WBC: 7.7 10*3/uL (ref 4.0–10.5)

## 2016-07-07 LAB — LACTATE DEHYDROGENASE: LDH: 215 U/L — AB (ref 98–192)

## 2016-07-07 LAB — PREPARE RBC (CROSSMATCH)

## 2016-07-07 LAB — BASIC METABOLIC PANEL
Anion gap: 6 (ref 5–15)
BUN: 26 mg/dL — AB (ref 6–20)
CALCIUM: 8.2 mg/dL — AB (ref 8.9–10.3)
CO2: 25 mmol/L (ref 22–32)
CREATININE: 0.9 mg/dL (ref 0.61–1.24)
Chloride: 101 mmol/L (ref 101–111)
GFR calc Af Amer: >60 mL/min (ref >60)
GLUCOSE: 114 mg/dL — AB (ref 65–99)
Potassium: 3.5 mmol/L (ref 3.5–5.1)
SODIUM: 132 mmol/L — AB (ref 135–145)

## 2016-07-07 LAB — PROTIME-INR
INR: 3.29
Prothrombin Time: 34.2 seconds — ABNORMAL HIGH (ref 11.4–15.2)

## 2016-07-07 MED ORDER — SILDENAFIL CITRATE 20 MG PO TABS
20.0000 mg | ORAL_TABLET | Freq: Three times a day (TID) | ORAL | Status: DC
  Administered 2016-07-07 – 2016-07-19 (×36): 20 mg via ORAL
  Filled 2016-07-07 (×37): qty 1

## 2016-07-07 MED ORDER — SODIUM CHLORIDE 0.9 % IV SOLN
Freq: Once | INTRAVENOUS | Status: AC
  Administered 2016-07-07: 17:00:00 via INTRAVENOUS

## 2016-07-07 MED ORDER — PANTOPRAZOLE SODIUM 40 MG PO TBEC: 40.0000 mg | DELAYED_RELEASE_TABLET | Freq: Every day | ORAL | Status: DC

## 2016-07-07 MED ORDER — ATORVASTATIN CALCIUM 40 MG PO TABS
40.0000 mg | ORAL_TABLET | Freq: Every day | ORAL | Status: DC
  Administered 2016-07-07 – 2016-07-18 (×12): 40 mg via ORAL
  Filled 2016-07-07 (×14): qty 1

## 2016-07-07 MED ORDER — EZETIMIBE 10 MG PO TABS
10.0000 mg | ORAL_TABLET | Freq: Every day | ORAL | Status: DC
  Administered 2016-07-07 – 2016-07-18 (×12): 10 mg via ORAL
  Filled 2016-07-07 (×13): qty 1

## 2016-07-07 MED ORDER — ACETAMINOPHEN 325 MG PO TABS: 650.0000 mg | ORAL_TABLET | ORAL | Status: DC | PRN

## 2016-07-07 MED ORDER — CEPHALEXIN 500 MG PO CAPS
500.0000 mg | ORAL_CAPSULE | Freq: Two times a day (BID) | ORAL | Status: AC
  Administered 2016-07-07 – 2016-07-14 (×14): 500 mg via ORAL
  Filled 2016-07-07 (×2): qty 1
  Filled 2016-07-07: qty 2
  Filled 2016-07-07 (×2): qty 1
  Filled 2016-07-07: qty 2
  Filled 2016-07-07 (×3): qty 1
  Filled 2016-07-07 (×2): qty 2
  Filled 2016-07-07: qty 1
  Filled 2016-07-07: qty 2
  Filled 2016-07-07 (×3): qty 1

## 2016-07-07 MED ORDER — FE FUMARATE-B12-VIT C-FA-IFC PO CAPS
1.0000 | ORAL_CAPSULE | Freq: Two times a day (BID) | ORAL | Status: DC
  Administered 2016-07-07 – 2016-07-19 (×23): 1 via ORAL
  Filled 2016-07-07 (×25): qty 1

## 2016-07-07 MED ORDER — LOSARTAN POTASSIUM 25 MG PO TABS
25.0000 mg | ORAL_TABLET | Freq: Two times a day (BID) | ORAL | Status: DC
  Administered 2016-07-07 – 2016-07-19 (×24): 25 mg via ORAL
  Filled 2016-07-07 (×24): qty 1

## 2016-07-07 MED ORDER — ONDANSETRON HCL 4 MG/2ML IJ SOLN: 4.0000 mg | Freq: Four times a day (QID) | INTRAMUSCULAR | Status: DC | PRN

## 2016-07-07 NOTE — Progress Notes (Signed)
Patient arrived the unit from the Clinic, with the VAD coordinators, VAD equipment was setup, assessment completed see flowsheet, placed on tele ccmd notified, patient oriented to room and staff, bed in lowest position, call bell within reach will continue to monitor

## 2016-07-07 NOTE — Progress Notes (Signed)
Patient presents for one week clinic f/u post HVAD implant on 05/30/16 for DT therapy per Dr. Maren BeachVanTrigt. Pt's primary caregiver (father) is with patient today. Pt states that he has had 'loose stools" since Tuesday and that has "really gotten me down." Pt states that he is feeling weak.   Reports one VAD alarm, while applying the DC adapter in car, he states that he thought the car was running but it was not.   Denies any drive line exit site trauma with any of these mishaps.   Vital Signs:  Doppler Pressure: 72 Automatc BP:  84/57 (67) HR: 91 SPO2: 100  Weight: 230.2  lbs Last weight: 238.2 lbs Home weights: 232 - 236 bs   VAD Indication: Destination Therapy due to current smoking history at time of implant - Evaluation reviewed with Dr. Deeann Saintevone per [facility] at [facility].   VAD interrogation & Equipment Management (reviewed with Dr. Shirlee LatchMcLean): Speed: 3000 Flow: 5.5 Power: 6.0 w    Peak: 6.7 Trough: 4.4 Alarms: 4 suction alarms since last visit. 1 pump stop due to pt changing to DC Adapter when the car wasn't running.   Low flow alarm: 3.0 High flow alarm: 8.0 Suction: On Lavare: On  Hct: 29  I reviewed the LVAD parameters from today and compared the results to the patient's prior recorded data. LVAD interrogation was NEGATIVE for significant power changes, POSITIVE for one clinical alarm as described above. Event log downloaded and sent for review.  Patient completing daily logbook. Reviewed by VAD coordinator and MD.   LVAD equipment check completed and is in good working order.    Exit Site Care: Drive line is being maintained daily by Dannielle HuhDanny (father).Existing VAD dressing removed and site care performed using sterile technique. Drive line exit site cleaned with Chlora prep applicators x 2, allowed to dry, and gauze dressing with Aquacel silver strip re-applied. Drive line exit with partial tissue ingrowth.  Slight redness around exit site, yellow drainage with no foul odor.  Stabilization device present and accurately applied. Pt denies fever or chills.  Significant Events on VAD Support:  05/31/16 - Controller power-up associated with pump start event. Controller change out performed.  06/01/16 - Controller pins lubricated per [employer] Jude reps  06/17/16 - Controller power-up associated with pump start event while changing power source 06/22/16 - Controller pins re-lubricated per [employer] reps   Device:  St Jude single lead Therapies: on at 200 bpm Last check: post implant  Anticoagulation: INR goal: 2 - 3 ASA 325 mg daily   BP & Labs:  Doppler BP 72  Hgb 7.0 - down 2 grams from last week.   LDH stable at  with established baseline of 190 - 250. Denies tea-colored urine. No power elevations noted on interrogation.   Patient Instructions: 1. Admit pt for blood transfusion and GI consult.   Carlton AdamSarah Jacquan Savas, RN VAD Coordinator   Office: [phone] 24/7 Emergency VAD Pager: [phone]     Advanced Heart Failure Clinic Note

## 2016-07-07 NOTE — Telephone Encounter (Signed)
I called patient to schedule for cardiac rehab. Patient is currently not ready for cardiac rehab. Patient informed me that he was currently here at the hospital [name]([place]) and they were admitting him for a couple of days. I informed patient I will contact him after he is discharged and follow up with him then about cardiac rehab. Referral on hold until further notice.

## 2016-07-07 NOTE — Progress Notes (Deleted)
GI-consulted for symptomatic anemia. Arrived to floor late  this PM.. Will check hemoccults and consult tomorrow

## 2016-07-07 NOTE — H&P (Addendum)
Advanced Heart Failure VAD History and Physical Note   Reason for Admission: Symptomatic Anemia    HPI:    61 yo smoker with history of PAD, CAD, and ischemic cardiomyopathy now s/p HVAD placement presents for VAD clinic followup.  Patient was admitted in 1/18 with PNA and acute on chronic systolic CHF.  He was markedly volume overloaded and also had low cardiac output requiring dobutamine use.  He had a right pleural effusion and underwent thoracentesis.  Fluid studies suggested a parapneumonic effusion.  He had cardiac cath, showing 3 vessel disease and markedly elevated filling pressures, right and left.  He was evaluated for CABG but turned down given comorbidities and lack of venous conduits.  We were able to wean him off dobutamine and discharged him with plan for outpatient PCI once he had recovered from the current hospitalization.   Admitted 01/28/16 - 02/03/16 after presenting for elective 2 vessel PCI of the LAD and OM of the circumflex. RHC at same time showed advanced HF with low cardiac index and severely elevated pressures. Diuresed with IV lasix, metolazone, and milrinone, but refused PICC line to follow coox/CVP. Underwent thoracentesis 02/01/16 with 1.8 L out. Overall diuresed 16 lbs. Discharge weight 214 lbs.  He was admitted in 4/18 with low output HF and severe NYHA class IV symptoms, started on milrinone.  Unable to titrate off milrinone and is now on at home.  He had a right chronic transudative effusion, PleurX catheter was placed.    He had HVAD placed 05/30/16.  He was diuresed post-op and speed was increased to 3000.  It was later dropped to 2940.  Pleurx catheter was removed.    Speed was increased to 3000 rpm and he was taken off Lasix and transitioned to torsemide.   Today he was evaluated in the VAD clinic and hgb was down to 7.0 from 9.0. Complaining of dizziness, fatigue, and blurred vision. Having diarrhea since Tuesday 3-4 times a day. Appetite poor. He dropped  controlled one day this week. Increased drainage around driveline. No fever or chills.  Denies BRBPR. Taking all medications.     LVAD INTERROGATION: HVAD  HeartMate II LVAD:  Flow 6.3 liters/min, speed 3000, power 6.1 w,  1 suction event on 06/23/2016   Review of Systems: [y] = yes, [ ]  = no   General: Weight gain [ ] ; Weight loss [Y ]; Anorexia [ ] ; Fatigue [Y ]; Fever [ ] ; Chills [ ] ; Weakness [Y ]  Cardiac: Chest pain/pressure [ ] ; Resting SOB [ ] ; Exertional SOB [ ] ; Orthopnea [ ] ; Pedal Edema [ ] ; Palpitations [ ] ; Syncope [ ] ; Presyncope [Y ]; Paroxysmal nocturnal dyspnea[ ]   Pulmonary: Cough [ ] ; Wheezing[ ] ; Hemoptysis[ ] ; Sputum [ ] ; Snoring [ ]   GI: Vomiting[ ] ; Dysphagia[ ] ; Melena[ ] ; Hematochezia [ ] ; Heartburn[ ] ; Abdominal pain [ ] ; Constipation [ ] ; Diarrhea [Y ]; BRBPR [ ]   GU: Hematuria[ ] ; Dysuria [ ] ; Nocturia[ ]   Vascular: Pain in legs with walking [ ] ; Pain in feet with lying flat [ ] ; Non-healing sores [ ] ; Stroke [ ] ; TIA [ ] ; Slurred speech [ ] ;  Neuro: Headaches[ ] ; Vertigo[ ] ; Seizures[ ] ; Paresthesias[ ] ;Blurred vision [ ] ; Diplopia [ ] ; Vision changes [Y ]  Ortho/Skin: Arthritis [ ] ; Joint pain [Y ]; Muscle pain [ ] ; Joint swelling [ ] ; Back Pain Y[ ] ; Rash [ ]   Psych: Depression[ ] ; Anxiety[ ]   Heme: Bleeding problems [ ] ;  Clotting disorders [ ] ; Anemia [ ]   Endocrine: Diabetes [Y]; Thyroid dysfunction[ ]     Home Medications Prior to Admission medications   Medication Sig Start Date End Date Taking? Authorizing Provider  amiodarone (PACERONE) 200 MG tablet Take 1 tablet (200 mg total) by mouth daily. 06/13/16   Clegg, Amy D, NP  aspirin 325 MG EC tablet Take 1 tablet (325 mg total) by mouth daily. Patient taking differently: Take 161 mg by mouth daily.  06/14/16   Clegg, Amy D, NP  atorvastatin (LIPITOR) 40 MG tablet Take 1 tablet (40 mg total) by mouth daily. 06/22/16   Laurey Morale, MD  ezetimibe (ZETIA) 10 MG tablet Take 1 tablet (10 mg total) by mouth  daily. 06/22/16   Laurey Morale, MD  ferrous fumarate-b12-vitamic C-folic acid (TRINSICON / FOLTRIN) capsule Take 1 capsule by mouth 2 (two) times daily between meals. 06/13/16   Clegg, Amy D, NP  fexofenadine (ALLEGRA) 180 MG tablet Take 180 mg by mouth every morning.    [provider]  insulin NPH-regular Human (NOVOLIN 70/30) (70-30) 100 UNIT/ML injection Inject 15-30 Units into the skin 3 (three) times daily. 30 units in the morning, 15-20 units in the afternoon, 15-20 units in the evening    [provider]  losartan (COZAAR) 25 MG tablet Take 1 tablet (25 mg total) by mouth 2 (two) times daily. 06/13/16   Clegg, Amy D, NP  pantoprazole (PROTONIX) 40 MG tablet Take 1 tablet (40 mg total) by mouth daily. 06/14/16   Clegg, Amy D, NP  potassium chloride SA (K-DUR,KLOR-CON) 20 MEQ tablet Take 1 tablet (20 mEq total) by mouth daily. 06/28/16   Laurey Morale, MD  sildenafil (REVATIO) 20 MG tablet Take 1 tablet (20 mg total) by mouth 3 (three) times daily. 05/02/16   Laurey Morale, MD  spironolactone (ALDACTONE) 25 MG tablet Take 1 tablet (25 mg total) by mouth daily. 06/22/16   Laurey Morale, MD  torsemide [facility]) 20 MG tablet Take 80 mg twice daily or as directed 06/28/16   Laurey Morale, MD  warfarin (COUMADIN) 5 MG tablet 10 mg by mouth daily except 12.5 mg on Mondays and Wednesdays 06/22/16   Standard, Venus, CNM    Past Medical History: Past Medical History:  Diagnosis Date  . AICD (automatic cardioverter/defibrillator) present   . Arthritis   . Barretts syndrome   . Carotid artery disease (HCC)    a. Dopp 09/2011: 60-79% RICA, 40-59% LICA.;  b.  Carotid US (7/14):  Bilateral 60-79% => f/u 6 mos  . Cerebrovascular disease   . Chronic systolic heart failure (HCC)   . Coronary artery disease    a. AWMI requiring IABP 2005 s/p Horizon study stent-LAD, staged BMS-Cx, DESx2-RCA. b. Last Last LHC (6/06):  EF 25%, pLAD stent 40-50, after stent 40, dLAD 20, pCFX 20, pOM1  70, pRCA 20, mRCA stents ok.=> med Rx;  c.  Myoview (7/14):  EF 26%, large ant, septal, inf and apical infarct, no ischemia.  Med Rx continued  . Diabetes mellitus   . Dyspnea    with exertion  . Headache   . Heart murmur   . History of kidney stones   . HTN (hypertension)   . Hyperlipidemia    mixed  . ICD (implantable cardiac defibrillator), dual, in situ    St. Jude for severe LVD EF 25% 2/07 explanted 2010. Medtronic Virtuoso II DR Dual-chamber cardiverter-defibrillator  with pocket revision, Dr. Graciela Husbands  . Ischemic  cardiomyopathy    a. Echo (09/27/12): EF 20%, diffuse HK with periapical AK, no LV thrombus noted, restrictive physiology, trivial MR, mild LAE, RVSF mildly reduced, PASP 39  . Pleural effusion 01/05/2016  . Presence of permanent cardiac pacemaker   . PVD (peripheral vascular disease) (HCC)    a. ABI 09/2011 - R normal, L moderate - saw Dr. Kirke Corin - med rx.   Marland Kitchen RIATA ICD Lead--on advisory recall    . Tobacco abuse     Past Surgical History: Past Surgical History:  Procedure Laterality Date  . ABDOMINAL AORTAGRAM N/A 11/06/2013   Procedure: ABDOMINAL Ronny Flurry;  Surgeon: Chuck Hint, MD;  Location: [facility] CATH LAB;  Service: Cardiovascular;  Laterality: N/A;  . APPENDECTOMY  2000   ruptured  . CARDIAC CATHETERIZATION N/A 01/07/2016   Procedure: Right/Left Heart Cath and Coronary Angiography;  Surgeon: Laurey Morale, MD;  Location: [facility] INVASIVE CV LAB;  Service: Cardiovascular;  Laterality: N/A;  . CARDIAC CATHETERIZATION N/A 01/28/2016   Procedure: Coronary Stent Intervention;  Surgeon: Tonny Bollman, MD;  Location: [facility] INVASIVE CV LAB;  Service: Cardiovascular;  Laterality: N/A;  . CARDIAC CATHETERIZATION N/A 01/28/2016   Procedure: Right Heart Cath;  Surgeon: Tonny Bollman, MD;  Location: [facility] INVASIVE CV LAB;  Service: Cardiovascular;  Laterality: N/A;  . CARDIAC CATHETERIZATION N/A 01/28/2016   Procedure: Intravascular Ultrasound/IVUS;  Surgeon: Tonny Bollman, MD;   Location: [facility] INVASIVE CV LAB;  Service: Cardiovascular;  Laterality: N/A;  . CHEST TUBE INSERTION Right 04/07/2016   Procedure: INSERTION PLEURAL DRAINAGE CATHETER;  Surgeon: Kerin Perna, MD;  Location: [facility] OR;  Service: Thoracic;  Laterality: Right;  . FEMORAL-POPLITEAL BYPASS GRAFT Right 11/07/2013   Procedure: Right FEMORAL-POPLITEAL ARTERY Bypass ;  Surgeon: Chuck Hint, MD;  Location: [facility] OR;  Service: Vascular;  Laterality: Right;  . FEMORAL-POPLITEAL BYPASS GRAFT Left 11/17/2014   Procedure: BYPASS GRAFT FEMORAL-POPLITEAL ARTERY USING GORE PROPATEN X 80CM VASCULAR GRAFT;  Surgeon: Chuck Hint, MD;  Location: [facility] OR;  Service: Vascular;  Laterality: Left;  . IMPLANTABLE CARDIOVERTER DEFIBRILLATOR (ICD) GENERATOR CHANGE N/A 12/16/2012   Procedure: ICD GENERATOR CHANGE;  Surgeon: [name], MD;  Location: [facility] CATH LAB;  Service: Cardiovascular;  Laterality: N/A;  . INSERTION OF IMPLANTABLE LEFT VENTRICULAR ASSIST DEVICE N/A 05/30/2016   Procedure: INSERTION OF IMPLANTABLE LEFT VENTRICULAR ASSIST DEVICE;  Surgeon: Kerin Perna, MD;  Location: [facility] OR;  Service: Open Heart Surgery;  Laterality: N/A;  CIRC ARREST  NITRIC OXIDE  . INTRAOPERATIVE ARTERIOGRAM Right 11/07/2013   Procedure: INTRA OPERATIVE ARTERIOGRAM;  Surgeon: Chuck Hint, MD;  Location: [facility] OR;  Service: Vascular;  Laterality: Right;  . LOWER EXTREMITY ANGIOGRAM Bilateral 11/06/2013   Procedure: LOWER EXTREMITY ANGIOGRAM;  Surgeon: Chuck Hint, MD;  Location: [facility] CATH LAB;  Service: Cardiovascular;  Laterality: Bilateral;  . Medtronic Virtuoso II DR dual-chamber cardioverter-defibrillation with pocket revison     Dr. Sherryl Manges  . MULTIPLE EXTRACTIONS WITH ALVEOLOPLASTY N/A 04/26/2016   Procedure: Extraction of tooth #'s 5-14 and 20-30 with alveoloplasty;  Surgeon: Charlynne Pander, DDS;  Location: [facility] OR;  Service: Oral Surgery;  Laterality: N/A;  . PERIPHERAL VASCULAR CATHETERIZATION  N/A 09/25/2014   Procedure: Abdominal Aortogram;  Surgeon: Sherren Kerns, MD;  Location: [facility] INVASIVE CV LAB;  Service: Cardiovascular;  Laterality: N/A;  . REMOVAL OF PLEURAL DRAINAGE CATHETER Right 06/13/2016   Procedure: REMOVAL OF PLEURAL DRAINAGE CATHETER;  Surgeon: Kerin Perna, MD;  Location: [facility] OR;  Service: Thoracic;  Laterality: Right;  . RIGHT HEART CATH N/A 05/01/2016   Procedure: Right Heart Cath;  Surgeon: Laurey Morale, MD;  Location: [facility] INVASIVE CV LAB;  Service: Cardiovascular;  Laterality: N/A;  . RIGHT HEART CATH N/A 05/25/2016   Procedure: Right Heart Cath;  Surgeon: Laurey Morale, MD;  Location: [facility] INVASIVE CV LAB;  Service: Cardiovascular;  Laterality: N/A;  . TEE WITHOUT CARDIOVERSION N/A 05/30/2016   Procedure: TRANSESOPHAGEAL ECHOCARDIOGRAM (TEE);  Surgeon: Donata Clay, Theron Arista, MD;  Location: [facility] OR;  Service: Open Heart Surgery;  Laterality: N/A;  . US GUIDED THORACENTESIS RIGHT ([facility] HX) Right 01/05/2016    Family History: Family History  Problem Relation Age of Onset  . Diabetes Mother   . Coronary artery disease Father   . Heart disease Father        before age 80    Social History: Social History   Social History  . Marital status: Divorced    Spouse name: N/A  . Number of children: 2  . Years of education: N/A   Occupational History  . 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 Smoking status: Current Every Day Smoker    Packs/day: 0.50    Years: 45.00    Types: Cigarettes  . Smokeless tobacco: Never Used     Comment: pt reports he is stress smoker  . Alcohol use No  . Drug use: No     Comment: former Cocain, Acid, Marijuana - 25 years ago  . Sexual activity: No   Other Topics Concern  . Not on file   Social History Narrative   Divorced. 2 children.   Previously worked at [employer].    Allergies:  Allergies  Allergen Reactions  . Metformin And Related Nausea And Vomiting    *Only the  extended release* (does this)  . Niacin And Related Other (See Comments)    REACTION IS SIDE EFFECT "SEVERE" FLUSHING    Objective:    Vital Signs:   BP: ()/()  Arterial Line BP: ()/()    There were no vitals filed for this visit.  Mean arterial Pressure 67  Physical Exam    General:  Pale. Chronically ill appearing. No resp difficulty HEENT: Normal Neck: supple. JVP ~10. Carotids 2+ bilat; no bruits. No lymphadenopathy or thyromegaly appreciated. Cor: Mechanical heart sounds with LVAD hum present. Lungs: Clear Abdomen: soft, nontender, nondistended. No hepatosplenomegaly. No bruits or masses. Good bowel sounds. Driveline: C/D/I; securement device intact and driveline incorporated Extremities: no cyanosis, clubbing, rash, R and LLE 1+ edema. Chronic hyperpigmentation.  Neuro: alert & orientedx3, cranial nerves grossly intact. moves all 4 extremities w/o difficulty. Affect pleasant   Telemetry     EKG   None  Labs    Basic Metabolic Panel:  Recent Labs Lab 07/07/16 0915  NA 132*  K 3.5  CL 101  CO2 25  GLUCOSE 114*  BUN 26*  CREATININE 0.90  CALCIUM 8.2*    Liver Function Tests: No results for input(s): AST, ALT, ALKPHOS, BILITOT, PROT, ALBUMIN in the last 168 hours. No results for input(s): LIPASE, AMYLASE in the last 168 hours. No results for input(s): AMMONIA in the last 168 hours.  CBC:  Recent Labs Lab 07/07/16 0915  WBC 7.7  HGB 7.0*  HCT 22.5*  MCV 89.6  PLT 192    Cardiac Enzymes: No results for input(s): CKTOTAL, CKMB, CKMBINDEX, TROPONINI in the last 168 hours.  BNP: BNP (last 3 results)  Recent Labs  05/24/16 1120 05/31/16 0228 06/06/16 0016  BNP 374.5* 270.0* 398.6*    ProBNP (last 3 results) No results for input(s): PROBNP in the last 8760 hours.   CBG: No results for input(s): GLUCAP in the last 168 hours.  Coagulation Studies:  Recent Labs  07/07/16 0915  LABPROT 34.2*  INR 3.29    Other results: EKG:  N/A  Imaging     No results found.    Patient Profile:  Mr Feinstein is a 61 year old with history of chronic systolic heart failure, recent HVAD implant on 05/30/2016, pulmonary HTN, and PAD.    Assessment/Plan:    1. Symptomatic Anemia- Hgb down from 9>7.0. Hold asa and coumadin. Continue protonix. Transfuse 2UPRBCs. Consult GI.  2. S/P HVAD 05/30/16- Parameters stable. As noted above off asa and coumadin for suspected GI  Bleed. Start heparin once INR <1.8. 3.Chronic Systolic Heart Failure- Volume status mildly elevated but has some RV failure. Hold diuretics for now. Maps low. Watch closely Can continue losartan and sildenafil. Renal function ok.  4. 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 Increased drainage around driveline. Start keflex 500 mg twice a day.  5. PAH - Continue sildenafil. 6. Carotid Stenosis - followed by [facility] in the community 7. PAF- stop amio due to nausea.   I reviewed the LVAD parameters from today, and compared the results to the patient's prior recorded data.  No programming changes were made.  The LVAD is functioning within specified parameters.  The patient performs LVAD self-test daily.  LVAD interrogation was negative for any significant power changes, alarms or PI events/speed drops.  LVAD equipment check completed and is in good working order.  Back-up equipment present.   LVAD education done on emergency procedures and precautions and reviewed exit site care.  Length of Stay: 0  [name], NP 07/07/2016, 10:35 AM  VAD Team Pager [phone] (7am - 7am) +++VAD ISSUES ONLY+++   Advanced Heart Failure Team Pager [phone] (M-F; 7a - 4p)  Please contact [facility] for night-coverage after hours (4p -7a ) and weekends on [email or web address] for all non- LVAD Issues  Patient seen with NP, agree with the above note.    Patient presents with symptomatic anemia.  Increased fatigue, hemoglobin down to 7.  He has had "diarrhea" since Monday or Tuesday.   Stool is not melanotic but at times has a maroon color.  INR supratherapeutic today.  He has been on ASA 162 mg daily and warfarin. Possible AVM bleeding.  - Hold warfarin, will need heparin gtt when INR < 1.8.  - Stop ASA - Continue PPI.  - Transfuse 2 units PRBCs.  - GI consult, will need endoscopy.   LVAD parameters reviewed and stable.  Flow and waveform look ok.  He has had 4 suction events in the setting of diarrhea and suspected GI bleeding. MAP 70s.  - Hold torsemide for today.  - Hold spironolactone.  - Can continue losartan and sildenafil for now.   He has ongoing evidence for RV failure with elevated JVP, but weight is down 8 lbs.  As above, hold torsemide today and may restart tomorrow.   Increased drainage around driveline: Start cephalexin.   Given nausea, no recent arrhythmias will stop amiodarone today.   Marca Ancona 07/07/2016 4:57 PM

## 2016-07-07 NOTE — Progress Notes (Signed)
P     Advanced Heart Failure Clinic Note   PCP: Dr. Sigmund Hazel Cardiology: Dr. Jens Som HF Cardiology: Dr. Shirlee Latch  61 yo smoker with history of PAD, CAD, and ischemic cardiomyopathy now s/p HVAD placement presents for VAD clinic followup.  Patient was admitted in 1/18 with PNA and acute on chronic systolic CHF.  He was markedly volume overloaded and also had low cardiac output requiring dobutamine use.  He had a right pleural effusion and underwent thoracentesis.  Fluid studies suggested a parapneumonic effusion.  He had cardiac cath, showing 3 vessel disease and markedly elevated filling pressures, right and left.  He was evaluated for CABG but turned down given comorbidities and lack of venous conduits.  We were able to wean him off dobutamine and discharged him with plan for outpatient PCI once he had recovered from the current hospitalization.   Admitted 01/28/16 - 02/03/16 after presenting for elective 2 vessel PCI of the LAD and OM of the circumflex. RHC at same time showed advanced HF with low cardiac index and severely elevated pressures. Diuresed with IV lasix, metolazone, and milrinone, but refused PICC line to follow coox/CVP. Underwent thoracentesis 02/01/16 with 1.8 L out. Overall diuresed 16 lbs. Discharge weight 214 lbs.  He was admitted in 4/18 with low output HF and severe NYHA class IV symptoms, started on milrinone.  Unable to titrate off milrinone and is now on at home.  He had a right chronic transudative effusion, PleurX catheter was placed.    He had HVAD placed 05/30/16.  He was diuresed post-op and speed was increased to 3000.  It was later dropped to 2940.  Pleurx catheter was removed.    Speed was increased to 3000 rpm and he was taken off Lasix and transitioned to torsemide.    Today he returns for 1 week follow up for HVAD with his Dad. Overall feeling weak. Had blurred vision today. Also having dizziness. Diarrhea 3-4 times a day for the last 4 days. Denies SOB/Orthopnea.  No fever or chills. He has dropped his controller. Increased drainage around driveline. Weight at home trending down to 226 pounds.  Denies BRBPR. Taking all medications.    On HVAD interrogation, there was 1 suction on 06/23/2016. ast appointment.  Lavare cycle is on.   Labs (1/18): K 4.2, creatinine 0.9, hgb 12.6 Labs (2/18): K 4.2, creatinine 1.23, hgb 12.4, BNP 321 Labs (4/18): hgbA1c 8.9, TSH elevated but free T4 normal.  K 3.2, creatinine 0.8 Labs (5/18): digoxin 0.4, K 3.4, creatinine 0.9 Labs (6/18): creatinine 0.67 => 0.98, LDH 209, hgb 8.9 => 9  HVAD interrogation: Per LVAD nurse's note above VAD interrogation & Equipment Management (reviewed with Dr. Shirlee Latch): Speed: 3000 Flow: 6.3 Power: 6.1 w Peak: 7.9 Trough: 4.9 Alarms: one low suction on 06/23/16 at 9 am  Low flow alarm: 3.0 High flow alarm: 8.0 Suction: On Lavare: On  Hct: 29  PMH: 1. PAD: Left fem-pop bypass with graft in 2017.  2. CAD: Anterior MI 2005 => PCI to LAD followed by staged PCI to LCx and RCA.   - LHC (1/18): Diffuse up to 80% calcified stenosis throughout the proximal LAD.  Diffuse disease in distal LAD up to 50%.  Large OM1 totally occluded at the ostium but reconstituted via collaterals.  80% distal RCA stenosis involving the ostia of the PLV and PDA with about 70% stenosis. Patient had PCI with DES to proximal LAD and PTCA OM1 (unable to place stent).  3. Chronic systolic  CHF: Ischemic cardiomyopathy.  St Jude ICD.  - Echo (1/18) with EF 20%, mildly decreased RV systolic function.  - RHC (1/18) with mean RA 21, PA 69/31 mean 45, mean PCWP 27, CI 2.37, PVR 3.3 WU.  - Repeat RHC (1/18) with mean RA 16, mean PCWP 21, CI 1.92 - Echo (3/18) with EF 20-25%, mild MR, mild RV dilation with normal systolic function, PASP 47 mmHg.  - 4/18 admission with low output HF and milrinone gtt started (co-ox as low as 42%).  - RHC on milrinone (4/18): mean RA 5, PA 61/24 mean 40, mean PCWP 17, CI 2.1 Fick with PVR  5.1 WU, CI 1.84 thermo with PVR 5.9 WU.  - HVAD placement 05/30/16.  4. Carotid stenosis: Carotid dopplers (8/16) with 60-79% RICA, 40-59% LICA.   - Carotid dopplers (2/18) with 40-59% RICA stenosis.  5. Type II diabetes. 6. Hyperlipidemia. 7. HTN 8. COPD:  PFTs (5/18) with moderate to severe obstructive disease.  Active smoker. 9. Chronic right pleural effusion (transudative) with PleurX catheter.   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  He has quit smoking.  Occasional ETOH.   Family History  Problem Relation Age of Onset  . Diabetes Mother   . Coronary artery disease Father   . Heart disease Father        before age 62   ROS: All systems reviewed and negative except as per HPI.   Current Outpatient Prescriptions  Medication Sig Dispense Refill  . atorvastatin (LIPITOR) 40 MG tablet Take 1 tablet (40 mg total) by mouth daily. 90 tablet 3  . ezetimibe (ZETIA) 10 MG tablet Take 1 tablet (10 mg total) by mouth daily. 90 tablet 3  . ferrous fumarate-b12-vitamic C-folic acid (TRINSICON / FOLTRIN) capsule Take 1 capsule by mouth 2 (two) times daily between meals. 60 capsule 6  . fexofenadine (ALLEGRA) 180 MG tablet Take 180 mg by mouth every morning.    . insulin NPH-regular Human (NOVOLIN 70/30) (70-30) 100 UNIT/ML injection Inject 15-30 Units into the skin 3 (three) times daily. 30 units in the morning, 15-20 units in the afternoon, 15-20 units in the evening    . losartan (COZAAR) 25 MG tablet Take 1 tablet (25 mg total) by mouth 2 (two) times daily. 60 tablet 6  . pantoprazole (PROTONIX) 40 MG tablet Take 1 tablet (40 mg total) by mouth daily. 30 tablet 6  . potassium chloride SA (K-DUR,KLOR-CON) 20 MEQ tablet Take 1 tablet (20 mEq total) by mouth daily. 90 tablet 3  . sildenafil (REVATIO) 20 MG tablet Take 1 tablet (20 mg total) by mouth 3 (three) times daily. 90 tablet 11  . spironolactone (ALDACTONE) 25 MG tablet Take 1 tablet (25 mg total) by mouth daily. 90  tablet 3  . torsemide (DEMADEX) 20 MG tablet Take 80 mg twice daily or as directed 180 tablet 3  . warfarin (COUMADIN) 5 MG tablet 10 mg by mouth daily except 12.5 mg on Mondays and Wednesdays 90 tablet 6  . amiodarone (PACERONE) 200 MG tablet Take 1 tablet (200 mg total) by mouth daily. 30 tablet 6  . aspirin 325 MG EC tablet Take 1 tablet (325 mg total) by mouth daily. (Patient taking differently: Take 161 mg by mouth daily. ) 30 tablet 6   No current facility-administered medications for this encounter.    BP (!) 84/57 Comment: map-67  Pulse 91   Ht 6' (1.829 m)   Wt 230 lb 3.2 oz (104.4  kg)   SpO2 100%   BMI 31.22 kg/m   MAP 88  Wt Readings from Last 3 Encounters:  07/07/16 230 lb 3.2 oz (104.4 kg)  06/28/16 238 lb 3.2 oz (108 kg)  06/22/16 242 lb 12.8 oz (110.1 kg)    Physical Exam: GENERAL: Appears pale. NAD  HEENT: normal  NECK: Supple, JVP ~10.  2+ bilaterally, no bruits.  No lymphadenopathy or thyromegaly appreciated.   CARDIAC:  Mechanical heart sounds with LVAD hum present.  LUNGS:  Clear to auscultation bilaterally.  ABDOMEN:  Soft, round, nontender, positive bowel sounds x4.     LVAD exit site: Drainage around driveline. Erythema noted. Stabilization device present and accurately applied.  Driveline dressing is being changed daily per sterile technique. EXTREMITIES:  Warm and dry, no cyanosis, clubbing, rash or R and LLE 1+ edema  NEUROLOGIC:  Alert and oriented x 4.  Gait steady.  No aphasia.  No dysarthria.  Affect pleasant.      Assessment/Plan: 1.Symptomatic Anemia- Hgb down to 7 from 9. Admit for GI work up. Hold asa and coumadin. Start heparin once INR < 1.8.  Give 2UPRBCs. GI consult. Follow CBC daily.  2. Chronic systolic CHF: Ischemic cardiomyopathy.  Echo in 3/18 with EF 20-25%.  He did not have much effect from 12/17 PCI in terms of symptoms or EF.  St Jude ICD.  He is now s/p HVAD placement. Increased drainage around driveline. Start keflex 500 mg twice  a day x 7 days.   Volume status low + MAP in the 60s. Hold diuretics today.  Can continue losartan and sildenafil.  3. CAD: 3 vessel CAD, s/p stent to proximal LAD and PTCA to subtotally occluded large OM1 in 1/18. No change in symptoms or EF with intervention.  - off asa + coumadin.  - Continue atorvastatin 80 mg daily.   4. PAD: Stable claudication, notes with moderate walking.   5. Carotid stenosis:  Followed at [facility].  6. Pulmonary arterial HTN: Continue Revatio 20 mg tid.  7. Former Smoker:  Admit for symptomatic anemia.     Amy Clegg NP-C  07/07/2016

## 2016-07-08 DIAGNOSIS — Z95811 Presence of heart assist device: Secondary | ICD-10-CM

## 2016-07-08 DIAGNOSIS — I5022 Chronic systolic (congestive) heart failure: Secondary | ICD-10-CM

## 2016-07-08 LAB — BASIC METABOLIC PANEL
Anion gap: 8 (ref 5–15)
BUN: 25 mg/dL — ABNORMAL HIGH (ref 6–20)
CALCIUM: 8.1 mg/dL — AB (ref 8.9–10.3)
CO2: 25 mmol/L (ref 22–32)
CREATININE: 0.96 mg/dL (ref 0.61–1.24)
Chloride: 99 mmol/L — ABNORMAL LOW (ref 101–111)
Glucose, Bld: 294 mg/dL — ABNORMAL HIGH (ref 65–99)
Potassium: 3.3 mmol/L — ABNORMAL LOW (ref 3.5–5.1)
SODIUM: 132 mmol/L — AB (ref 135–145)

## 2016-07-08 LAB — CBC
HCT: 27.7 % — ABNORMAL LOW (ref 39.0–52.0)
HEMATOCRIT: 24.8 % — AB (ref 39.0–52.0)
Hemoglobin: 7.9 g/dL — ABNORMAL LOW (ref 13.0–17.0)
Hemoglobin: 8.9 g/dL — ABNORMAL LOW (ref 13.0–17.0)
MCH: 27.9 pg (ref 26.0–34.0)
MCH: 28.4 pg (ref 26.0–34.0)
MCHC: 31.9 g/dL (ref 30.0–36.0)
MCHC: 32.1 g/dL (ref 30.0–36.0)
MCV: 87.6 fL (ref 78.0–100.0)
MCV: 88.5 fL (ref 78.0–100.0)
PLATELETS: 160 10*3/uL (ref 150–400)
PLATELETS: 173 10*3/uL (ref 150–400)
RBC: 2.83 MIL/uL — ABNORMAL LOW (ref 4.22–5.81)
RBC: 3.13 MIL/uL — ABNORMAL LOW (ref 4.22–5.81)
RDW: 16.6 % — AB (ref 11.5–15.5)
RDW: 16.7 % — AB (ref 11.5–15.5)
WBC: 6.8 10*3/uL (ref 4.0–10.5)
WBC: 5.9 10*3/uL (ref 4.0–10.5)

## 2016-07-08 LAB — LACTATE DEHYDROGENASE: LDH: 175 U/L (ref 98–192)

## 2016-07-08 LAB — GLUCOSE, CAPILLARY: Glucose-Capillary: 362 mg/dL — ABNORMAL HIGH (ref 65–99)

## 2016-07-08 LAB — PREPARE RBC (CROSSMATCH)

## 2016-07-08 LAB — PROTIME-INR
INR: 2.82
PROTHROMBIN TIME: 30.3 s — AB (ref 11.4–15.2)

## 2016-07-08 MED ORDER — TORSEMIDE 20 MG PO TABS
80.0000 mg | ORAL_TABLET | Freq: Every day | ORAL | Status: DC
  Administered 2016-07-08 – 2016-07-09 (×2): 80 mg via ORAL
  Filled 2016-07-08 (×3): qty 4

## 2016-07-08 MED ORDER — INSULIN ASPART 100 UNIT/ML ~~LOC~~ SOLN
5.0000 [IU] | Freq: Once | SUBCUTANEOUS | Status: AC
  Administered 2016-07-08: 5 [IU] via SUBCUTANEOUS

## 2016-07-08 MED ORDER — SODIUM CHLORIDE 0.9 % IV SOLN: Freq: Once | INTRAVENOUS | Status: DC

## 2016-07-08 MED ORDER — PANTOPRAZOLE SODIUM 40 MG PO TBEC
40.0000 mg | DELAYED_RELEASE_TABLET | Freq: Two times a day (BID) | ORAL | Status: DC
  Administered 2016-07-08: 40 mg via ORAL
  Filled 2016-07-08: qty 1

## 2016-07-08 MED ORDER — PANTOPRAZOLE SODIUM 40 MG IV SOLR
40.0000 mg | Freq: Two times a day (BID) | INTRAVENOUS | Status: DC
  Administered 2016-07-08 – 2016-07-11 (×7): 40 mg via INTRAVENOUS
  Filled 2016-07-08 (×8): qty 40

## 2016-07-08 MED ORDER — POTASSIUM CHLORIDE CRYS ER 20 MEQ PO TBCR
40.0000 meq | EXTENDED_RELEASE_TABLET | Freq: Every day | ORAL | Status: DC
  Administered 2016-07-08 – 2016-07-11 (×4): 40 meq via ORAL
  Filled 2016-07-08 (×4): qty 2

## 2016-07-08 NOTE — Progress Notes (Signed)
Patient ID: [name], male   DOB: 02-22-1955, 61 y.o.   MRN: [id number]   Advanced Heart Failure VAD Team Note  Subjective:    Patient had 2 episodes of melena overnight.  He had 2 units PRBCs on 7/6, hemoglobin 7 => 7.9.    Feels the same, no problems resting but more fatigued with walking than usual. Frustrated with being back in hospital.   HVAD INTERROGATION:  Flow 5.9 liters/min, peak/trough 7.5/5, speed 3000 rpm, power 5.9 W.  No alarms. Lavare on.   Objective:    Vital Signs:   Temp:  [98.3 F (36.8 C)-99 F (37.2 C)] 98.4 F (36.9 C) (07/07 0540) Pulse Rate:  [83-90] 86 (07/07 0540) Resp:  [18] 18 (07/07 0540) BP: (83-93)/(68-74) 88/74 (07/06 2351) SpO2:  [97 %-100 %] 97 % (07/07 0540) Weight:  [230 lb 1.6 oz (104.4 kg)-231 lb 1.6 oz (104.8 kg)] 231 lb 1.6 oz (104.8 kg) (07/07 0540) Last BM Date: 07/07/16 Mean arterial Pressure 70s-81  Intake/Output:   Intake/Output Summary (Last 24 hours) at 07/08/16 0801 Last data filed at 07/08/16 0026  Gross per 24 hour  Intake              967 ml  Output              600 ml  Net              367 ml     Physical Exam    General:  Well appearing. No resp difficulty HEENT: normal Neck: supple. JVP 8-9 cm. Carotids 2+ bilat; no bruits. No lymphadenopathy or thyromegaly appreciated. Cor: Mechanical heart sounds with LVAD hum present. Lungs: clear Abdomen: soft, nontender, nondistended. No hepatosplenomegaly. No bruits or masses. Good bowel sounds. Driveline: C/D/I; securement device intact and driveline incorporated Extremities: no cyanosis, clubbing. Venous stasis changes lower legs with 1+ ankle edema.  Neuro: alert & orientedx3, cranial nerves grossly intact. moves all 4 extremities w/o difficulty. Affect pleasant   Telemetry   Personally reviewed, NSR.   Labs   Basic Metabolic Panel:  Recent Labs Lab 07/07/16 0915 07/08/16 0135  NA 132* 132*  K 3.5 3.3*  CL 101 99*  CO2 25 25  GLUCOSE 114* 294*  BUN  26* 25*  CREATININE 0.90 0.96  CALCIUM 8.2* 8.1*    Liver Function Tests: No results for input(s): AST, ALT, ALKPHOS, BILITOT, PROT, ALBUMIN in the last 168 hours. No results for input(s): LIPASE, AMYLASE in the last 168 hours. No results for input(s): AMMONIA in the last 168 hours.  CBC:  Recent Labs Lab 07/07/16 0915 07/08/16 0135  WBC 7.7 6.8  HGB 7.0* 7.9*  HCT 22.5* 24.8*  MCV 89.6 87.6  PLT 192 160    INR:  Recent Labs Lab 07/07/16 0915 07/08/16 0135  INR 3.29 2.82    Other results:  EKG:    Imaging    No results found.   Medications:     Scheduled Medications: . atorvastatin  40 mg Oral q1800  . cephALEXin  500 mg Oral Q12H  . ezetimibe  10 mg Oral q1800  . ferrous fumarate-b12-vitamic C-folic acid  1 capsule Oral BID BM  . losartan  25 mg Oral BID  . pantoprazole  40 mg Oral Daily  . potassium chloride  40 mEq Oral Daily  . sildenafil  20 mg Oral TID  . torsemide  80 mg Oral Daily     Infusions: . sodium chloride  PRN Medications:  acetaminophen, ondansetron (ZOFRAN) IV   Patient Profile   [name] is a 61 year old with history of chronic systolic heart failure, recent HVAD implant on 05/30/2016, pulmonary HTN, and PAD.   Assessment/Plan:    1. Anemia/GI bleeding: Patient presented with symptomatic anemia and melena.  Increased fatigue, hemoglobin down to 7.  He has had "diarrhea" since Monday or Tuesday.  INR supratherapeutic at admission.  He had been on ASA 162 mg daily and warfarin. Possible AVM bleeding.  He has had 2 units PRBCs, hgb up to 7.9 today.  He had ongoing melena overnight.  ASA and warfarin held, INR 2.82 today.  - Hold warfarin, will need heparin gtt when INR < 1.8.  - Continue PPI bid.  - Transfuse 1 unit PRBCs for hgb 7.9 and active bleeding.  - GI consult, will need endoscopy. Given LVAD, possible small bowel AVM and may need push enteroscopy if nothing found in stomach/duodenum.  - If AVM found, will plan  for outpatient octreotide.  2. Chronic systolic CHF: Ischemic cardiomyopathy, St Jude ICD.  He has had trouble with RV failure and volume overload but had been improving on Revatio + torsemide 80 mg bid at home.  HVAD present. HVAD parameters reviewed and stable.  Flow and waveform look ok.  No suction alarms.  MAP 70s.   - Restart torsemide 80 mg daily with K replacement (home dose torsemide 80 bid).   - We held spironolactone at admission with lower BP but he continues on losartan.  - Continue Revatio.  3. ID: Increased drainage around driveline. Started cephalexin.  4. Smoking: He has restarted.  Encouraged to quit.  5. Atrial fibrillation: Paroxysmal.  We have stopped amiodarone.  6. CAD: Stable, continues on statin.   Given nausea, no recent arrhythmias will stop amiodarone today.  I reviewed the LVAD parameters from today, and compared the results to the patient's prior recorded data.  No programming changes were made.  The LVAD is functioning within specified parameters.  The patient performs LVAD self-test daily.  LVAD interrogation was negative for any significant power changes, alarms or PI events/speed drops.  LVAD equipment check completed and is in good working order.  Back-up equipment present.   LVAD education done on emergency procedures and precautions and reviewed exit site care.  Length of Stay: 1  Marca Anconaalton McLean, MD 07/08/2016, 8:01 AM  VAD Team --- VAD ISSUES ONLY--- Pager [phone] (7am - 7am)  Advanced Heart Failure Team  Pager [phone] (M-F; 7a - 4p)  Please contact [facility] for night-coverage after hours (4p -7a ) and weekends on [email or web address]

## 2016-07-08 NOTE — Consult Note (Signed)
[facility] Consult Note  Referring Provider: Kathyrn Lass, MD Primary Care Physician:  Kathyrn Lass, MD Primary Gastroenterologist:  Dr.  Laurel Dimmer Complaint: Melena and low hemoglobin HPI: [name] is an 61 y.o. white male  with cardiomyopathy status post H VAD placement on 5/29. He presented to VAD clinic and found to have a drop in hemoglobin from 9-7 was complaining of dizziness fatigue and some dark stool. The patient had an EGD in 2015 which showed short segment Barrett's esophagus and a colonoscopy in 2008 which showed polyps for which she has never followed up. He is on Coumadin with a presenting INR of 2.82. Reportedly had 2 episodes of melena overnight and was transfused 2 units of packed red blood cells with the resultant hemoglobin of 7.9. He states his stools of been dark for at least 2 weeks and does take iron. He denies any NSAID use. He denies any significant abdominal pain denies nausea or vomiting. He ate a full breakfast this morning.  Past Medical History:  Diagnosis Date  . AICD (automatic cardioverter/defibrillator) present   . Arthritis   . Barretts syndrome   . Carotid artery disease ([place])    a. Dopp 09/2011: 83-15% RICA, 17-61% LICA.;  b.  Carotid US (7/14):  Bilateral 60-79% => f/u 6 mos  . Cerebrovascular disease   . Chronic systolic heart failure ([place])   . Coronary artery disease    a. AWMI requiring IABP 2005 s/p Horizon study stent-LAD, staged BMS-Cx, DESx2-RCA. b. Last Last LHC (6/06):  EF 25%, pLAD stent 40-50, after stent 40, dLAD 20, pCFX 20, pOM1 70, pRCA 20, mRCA stents ok.=> med Rx;  c.  Myoview (7/14):  EF 26%, large ant, septal, inf and apical infarct, no ischemia.  Med Rx continued  . Diabetes mellitus   . Dyspnea    with exertion  . Headache   . Heart murmur   . History of kidney stones   . HTN (hypertension)   . Hyperlipidemia    mixed  . ICD (implantable cardiac defibrillator), dual, in situ    St. Jude for severe LVD EF 25% 2/07  explanted 2010. Medtronic Virtuoso II DR Dual-chamber cardiverter-defibrillator  with pocket revision, Dr. Caryl Comes  . Ischemic cardiomyopathy    a. Echo (09/27/12): EF 20%, diffuse HK with periapical AK, no LV thrombus noted, restrictive physiology, trivial MR, mild LAE, RVSF mildly reduced, PASP 39  . Pleural effusion 01/05/2016  . Presence of permanent cardiac pacemaker   . PVD (peripheral vascular disease) (H[facility])    a. ABI 09/2011 - R normal, L moderate - saw Dr. Fletcher Anon - med rx.   Marland Kitchen RIATA ICD Lead--on advisory recall    . Tobacco abuse     Past Surgical History:  Procedure Laterality Date  . ABDOMINAL AORTAGRAM N/A 11/06/2013   Procedure: ABDOMINAL Maxcine Ham;  Surgeon: Angelia Mould, MD;  Location: [facility] CATH LAB;  Service: Cardiovascular;  Laterality: N/A;  . APPENDECTOMY  2000   ruptured  . CARDIAC CATHETERIZATION N/A 01/07/2016   Procedure: Right/Left Heart Cath and Coronary Angiography;  Surgeon: Larey Dresser, MD;  Location: [place] CV LAB;  Service: Cardiovascular;  Laterality: N/A;  . CARDIAC CATHETERIZATION N/A 01/28/2016   Procedure: Coronary Stent Intervention;  Surgeon: Sherren Mocha, MD;  Location: [place] CV LAB;  Service: Cardiovascular;  Laterality: N/A;  . CARDIAC CATHETERIZATION N/A 01/28/2016   Procedure: Right Heart Cath;  Surgeon: Sherren Mocha, MD;  Location: [place] CV LAB;  Service: Cardiovascular;  Laterality: N/A;  . CARDIAC CATHETERIZATION N/A 01/28/2016   Procedure: Intravascular Ultrasound/IVUS;  Surgeon: Sherren Mocha, MD;  Location: [place] CV LAB;  Service: Cardiovascular;  Laterality: N/A;  . CHEST TUBE INSERTION Right 04/07/2016   Procedure: INSERTION PLEURAL DRAINAGE CATHETER;  Surgeon: Ivin Poot, MD;  Location: [place];  Service: Thoracic;  Laterality: Right;  . FEMORAL-POPLITEAL BYPASS GRAFT Right 11/07/2013   Procedure: Right FEMORAL-POPLITEAL ARTERY Bypass ;  Surgeon: Angelia Mould, MD;  Location: [place];  Service: Vascular;   Laterality: Right;  . FEMORAL-POPLITEAL BYPASS GRAFT Left 11/17/2014   Procedure: BYPASS GRAFT FEMORAL-POPLITEAL ARTERY USING GORE PROPATEN 6MM X 80CM VASCULAR GRAFT;  Surgeon: Angelia Mould, MD;  Location: [place];  Service: Vascular;  Laterality: Left;  . IMPLANTABLE CARDIOVERTER DEFIBRILLATOR (ICD) GENERATOR CHANGE N/A 12/16/2012   Procedure: ICD GENERATOR CHANGE;  Surgeon: Deboraha Sprang, MD;  Location: [facility] CATH LAB;  Service: Cardiovascular;  Laterality: N/A;  . INSERTION OF IMPLANTABLE LEFT VENTRICULAR ASSIST DEVICE N/A 05/30/2016   Procedure: INSERTION OF IMPLANTABLE LEFT VENTRICULAR ASSIST DEVICE;  Surgeon: Ivin Poot, MD;  Location: [place];  Service: Open Heart Surgery;  Laterality: N/A;  CIRC ARREST  NITRIC OXIDE  . INTRAOPERATIVE ARTERIOGRAM Right 11/07/2013   Procedure: INTRA OPERATIVE ARTERIOGRAM;  Surgeon: Angelia Mould, MD;  Location: [place];  Service: Vascular;  Laterality: Right;  . LOWER EXTREMITY ANGIOGRAM Bilateral 11/06/2013   Procedure: LOWER EXTREMITY ANGIOGRAM;  Surgeon: Angelia Mould, MD;  Location: [facility] CATH LAB;  Service: Cardiovascular;  Laterality: Bilateral;  . Medtronic Virtuoso II DR dual-chamber cardioverter-defibrillation with pocket revison     Dr. Virl Axe  . MULTIPLE EXTRACTIONS WITH ALVEOLOPLASTY N/A 04/26/2016   Procedure: Extraction of tooth #'s 5-14 and 20-30 with alveoloplasty;  Surgeon: Lenn Cal, DDS;  Location: [place];  Service: Oral Surgery;  Laterality: N/A;  . PERIPHERAL VASCULAR CATHETERIZATION N/A 09/25/2014   Procedure: Abdominal Aortogram;  Surgeon: Elam Dutch, MD;  Location: [place] CV LAB;  Service: Cardiovascular;  Laterality: N/A;  . REMOVAL OF PLEURAL DRAINAGE CATHETER Right 06/13/2016   Procedure: REMOVAL OF PLEURAL DRAINAGE CATHETER;  Surgeon: Ivin Poot, MD;  Location: [place];  Service: Thoracic;  Laterality: Right;  . RIGHT HEART CATH N/A 05/01/2016   Procedure: Right Heart Cath;  Surgeon:  Larey Dresser, MD;  Location: [place] CV LAB;  Service: Cardiovascular;  Laterality: N/A;  . RIGHT HEART CATH N/A 05/25/2016   Procedure: Right Heart Cath;  Surgeon: Larey Dresser, MD;  Location: [place] CV LAB;  Service: Cardiovascular;  Laterality: N/A;  . TEE WITHOUT CARDIOVERSION N/A 05/30/2016   Procedure: TRANSESOPHAGEAL ECHOCARDIOGRAM (TEE);  Surgeon: Prescott Gum, Collier Salina, MD;  Location: [place];  Service: Open Heart Surgery;  Laterality: N/A;  . US GUIDED THORACENTESIS RIGHT ([place] HX) Right 01/05/2016    Medications Prior to Admission  Medication Sig Dispense Refill  . amiodarone (PACERONE) 200 MG tablet Take 1 tablet (200 mg total) by mouth daily. 30 tablet 6  . aspirin 325 MG EC tablet Take 1 tablet (325 mg total) by mouth daily. (Patient taking differently: Take 161 mg by mouth daily. ) 30 tablet 6  . atorvastatin (LIPITOR) 40 MG tablet Take 1 tablet (40 mg total) by mouth daily. 90 tablet 3  . ezetimibe (ZETIA) 10 MG tablet Take 1 tablet (10 mg total) by mouth daily. 90 tablet 3  . ferrous [id number] C-folic acid (TRINSICON / FOLTRIN) capsule Take 1 capsule by  mouth 2 (two) times daily between meals. 60 capsule 6  . insulin NPH-regular Human (NOVOLIN 70/30) (70-30) 100 UNIT/ML injection Inject 15-30 Units into the skin See admin instructions. 30 units in the morning, 15-20 units in the afternoon, 15-20 units in the evening    . losartan (COZAAR) 25 MG tablet Take 1 tablet (25 mg total) by mouth 2 (two) times daily. 60 tablet 6  . pantoprazole (PROTONIX) 40 MG tablet Take 1 tablet (40 mg total) by mouth daily. 30 tablet 6  . potassium chloride SA (K-DUR,KLOR-CON) 20 MEQ tablet Take 1 tablet (20 mEq total) by mouth daily. 90 tablet 3  . sildenafil (REVATIO) 20 MG tablet Take 1 tablet (20 mg total) by mouth 3 (three) times daily. 90 tablet 11  . spironolactone (ALDACTONE) 25 MG tablet Take 1 tablet (25 mg total) by mouth daily. 90 tablet 3  . torsemide (DEMADEX) 20 MG  tablet Take 80 mg twice daily or as directed 180 tablet 3  . warfarin (COUMADIN) 5 MG tablet 10 mg by mouth daily except 12.5 mg on Mondays and Wednesdays (Patient taking differently: Take 10-12.5 mg by mouth See admin instructions. 10 mg by mouth daily except 12.5 mg on Mondays and Wednesdays) 90 tablet 6    Allergies:  Allergies  Allergen Reactions  . Metformin And Related Nausea And Vomiting    *Only the extended release* (does this)  . Niacin And Related Other (See Comments)    REACTION IS SIDE EFFECT "SEVERE" FLUSHING    Family History  Problem Relation Age of Onset  . Diabetes Mother   . Coronary artery disease Father   . Heart disease Father        before age 18    Social History:  reports that he has been smoking Cigarettes.  He has a 22.50 pack-year smoking history. He has never used smokeless tobacco. He reports that he does not drink alcohol or use drugs.  Review of Systems: negative except As above   Blood pressure (!) 88/74, pulse 86, temperature 98.4 F (36.9 C), temperature source Oral, resp. rate 18, height 6' (1.829 m), weight 104.8 kg (231 lb 1.6 oz), SpO2 97 %. Head: Normocephalic, without obvious abnormality, atraumatic Neck: no adenopathy, no carotid bruit, no JVD, supple, symmetrical, trachea midline and thyroid not enlarged, symmetric, no tenderness/mass/nodules Resp: clear to auscultation bilaterally Cardio: regular rate and rhythm, S1, S2 normal, no murmur, click, rub or gallop GI: Abdomen slightly distended generally nontender no masses Extremities: extremities normal, atraumatic, no cyanosis or edema  Results for orders placed or performed during the hospital encounter of 07/07/16 (from the past 48 hour(s))  Type and screen     Status: None (Preliminary result)   Collection Time: 07/07/16  3:44 PM  Result Value Ref Range   ABO/RH(D) A POS    Antibody Screen NEG    Sample Expiration 07/10/2016    Unit Number [id number]    Blood Component Type  RED CELLS,LR    Unit division 00    Status of Unit ISSUED,FINAL    Transfusion Status OK TO TRANSFUSE    Crossmatch Result Compatible    Unit Number [id number]    Blood Component Type RED CELLS,LR    Unit division 00    Status of Unit ISSUED,FINAL    Transfusion Status OK TO TRANSFUSE    Crossmatch Result Compatible    Unit Number [id number]    Blood Component Type RED CELLS,LR    Unit division 00  Status of Unit ALLOCATED    Transfusion Status OK TO TRANSFUSE    Crossmatch Result Compatible   Prepare RBC     Status: None   Collection Time: 07/07/16  3:44 PM  Result Value Ref Range   Order Confirmation ORDER PROCESSED BY BLOOD BANK   Lactate dehydrogenase     Status: None   Collection Time: 07/08/16  1:35 AM  Result Value Ref Range   LDH 175 98 - 192 U/L  Protime-INR     Status: Abnormal   Collection Time: 07/08/16  1:35 AM  Result Value Ref Range   Prothrombin Time 30.3 (H) 11.4 - 15.2 seconds   INR 2.82   CBC     Status: Abnormal   Collection Time: 07/08/16  1:35 AM  Result Value Ref Range   WBC 6.8 4.0 - 10.5 K/uL   RBC 2.83 (L) 4.22 - 5.81 MIL/uL   Hemoglobin 7.9 (L) 13.0 - 17.0 g/dL   HCT 24.8 (L) 39.0 - 52.0 %   MCV 87.6 78.0 - 100.0 fL   MCH 27.9 26.0 - 34.0 pg   MCHC 31.9 30.0 - 36.0 g/dL   RDW 16.6 (H) 11.5 - 15.5 %   Platelets 160 150 - 400 K/uL  Basic metabolic panel     Status: Abnormal   Collection Time: 07/08/16  1:35 AM  Result Value Ref Range   Sodium 132 (L) 135 - 145 mmol/L   Potassium 3.3 (L) 3.5 - 5.1 mmol/L   Chloride 99 (L) 101 - 111 mmol/L   CO2 25 22 - 32 mmol/L   Glucose, Bld 294 (H) 65 - 99 mg/dL   BUN 25 (H) 6 - 20 mg/dL   Creatinine, Ser 0.96 0.61 - 1.24 mg/dL   Calcium 8.1 (L) 8.9 - 10.3 mg/dL   GFR calc non Af Amer >60 >60 mL/min   GFR calc Af Amer >60 >60 mL/min    Comment: (NOTE) The eGFR has been calculated using the CKD EPI equation. This calculation has not been validated in all clinical situations. eGFR's  persistently <60 mL/min signify possible Chronic Kidney Disease.    Anion gap 8 5 - 15  Prepare RBC     Status: None   Collection Time: 07/08/16  8:04 AM  Result Value Ref Range   Order Confirmation ORDER PROCESSED BY BLOOD BANK    No results found.  Assessment: Anemia with suspected GI bleeding on Coumadin after recent H VAD procedure. Plan:  1. IV PPI 2. Reverse Coumadin to the extent allowed by cardiac situation 3. Nothing by mouth after midnight for EGD tomorrow. 4. Will follow with you Braun Rocca C 07/08/2016, 8:21 AM  Pager [phone] If no answer or after 5 PM call [phone]

## 2016-07-09 LAB — PROTIME-INR
INR: 2.09
INR: 2.14
INR: 1.5
PROTHROMBIN TIME: 23.9 s — AB (ref 11.4–15.2)
PROTHROMBIN TIME: 18.2 s — AB (ref 11.4–15.2)
Prothrombin Time: 24.3 seconds — ABNORMAL HIGH (ref 11.4–15.2)

## 2016-07-09 LAB — CBC
HCT: 26 % — ABNORMAL LOW (ref 39.0–52.0)
HEMOGLOBIN: 8.3 g/dL — AB (ref 13.0–17.0)
MCH: 28.4 pg (ref 26.0–34.0)
MCHC: 31.9 g/dL (ref 30.0–36.0)
MCV: 89 fL (ref 78.0–100.0)
Platelets: 153 10*3/uL (ref 150–400)
RBC: 2.92 MIL/uL — AB (ref 4.22–5.81)
RDW: 17.1 % — ABNORMAL HIGH (ref 11.5–15.5)
WBC: 6.7 10*3/uL (ref 4.0–10.5)

## 2016-07-09 LAB — BASIC METABOLIC PANEL
ANION GAP: 6 (ref 5–15)
BUN: 17 mg/dL (ref 6–20)
CHLORIDE: 98 mmol/L — AB (ref 101–111)
CO2: 28 mmol/L (ref 22–32)
Calcium: 8.3 mg/dL — ABNORMAL LOW (ref 8.9–10.3)
Creatinine, Ser: 0.92 mg/dL (ref 0.61–1.24)
GFR calc non Af Amer: >60 mL/min (ref >60)
Glucose, Bld: 283 mg/dL — ABNORMAL HIGH (ref 65–99)
POTASSIUM: 3.7 mmol/L (ref 3.5–5.1)
SODIUM: 132 mmol/L — AB (ref 135–145)

## 2016-07-09 LAB — GLUCOSE, CAPILLARY
GLUCOSE-CAPILLARY: 268 mg/dL — AB (ref 65–99)
GLUCOSE-CAPILLARY: 512 mg/dL — AB (ref 65–99)
Glucose-Capillary: 369 mg/dL — ABNORMAL HIGH (ref 65–99)

## 2016-07-09 LAB — PREPARE RBC (CROSSMATCH)

## 2016-07-09 LAB — LACTATE DEHYDROGENASE: LDH: 169 U/L (ref 98–192)

## 2016-07-09 MED ORDER — INSULIN ASPART 100 UNIT/ML ~~LOC~~ SOLN
0.0000 [IU] | Freq: Every day | SUBCUTANEOUS | Status: DC
  Administered 2016-07-09: 5 [IU] via SUBCUTANEOUS
  Administered 2016-07-10: 3 [IU] via SUBCUTANEOUS
  Administered 2016-07-11: 2 [IU] via SUBCUTANEOUS
  Administered 2016-07-13: 5 [IU] via SUBCUTANEOUS
  Administered 2016-07-14 – 2016-07-16 (×2): 4 [IU] via SUBCUTANEOUS
  Administered 2016-07-17 – 2016-07-18 (×2): 2 [IU] via SUBCUTANEOUS

## 2016-07-09 MED ORDER — SODIUM CHLORIDE 0.9 % IV SOLN: Freq: Once | INTRAVENOUS | Status: DC

## 2016-07-09 MED ORDER — POTASSIUM CHLORIDE CRYS ER 20 MEQ PO TBCR
40.0000 meq | EXTENDED_RELEASE_TABLET | Freq: Once | ORAL | Status: AC
  Administered 2016-07-09: 40 meq via ORAL
  Filled 2016-07-09: qty 2

## 2016-07-09 MED ORDER — SPIRONOLACTONE 25 MG PO TABS
25.0000 mg | ORAL_TABLET | Freq: Every day | ORAL | Status: DC
  Administered 2016-07-09: 25 mg via ORAL
  Filled 2016-07-09: qty 1

## 2016-07-09 MED ORDER — INSULIN ASPART 100 UNIT/ML ~~LOC~~ SOLN
0.0000 [IU] | Freq: Three times a day (TID) | SUBCUTANEOUS | Status: DC
  Administered 2016-07-09: 9 [IU] via SUBCUTANEOUS

## 2016-07-09 MED ORDER — INSULIN ASPART 100 UNIT/ML ~~LOC~~ SOLN
0.0000 [IU] | Freq: Three times a day (TID) | SUBCUTANEOUS | Status: DC
  Administered 2016-07-10: 3 [IU] via SUBCUTANEOUS
  Administered 2016-07-10: 5 [IU] via SUBCUTANEOUS
  Administered 2016-07-11: 3 [IU] via SUBCUTANEOUS
  Administered 2016-07-11: 2 [IU] via SUBCUTANEOUS
  Administered 2016-07-11 – 2016-07-12 (×2): 5 [IU] via SUBCUTANEOUS
  Administered 2016-07-12: 3 [IU] via SUBCUTANEOUS
  Administered 2016-07-12: 2 [IU] via SUBCUTANEOUS
  Administered 2016-07-13 (×2): 5 [IU] via SUBCUTANEOUS
  Administered 2016-07-13 – 2016-07-14 (×2): 7 [IU] via SUBCUTANEOUS
  Administered 2016-07-14: 5 [IU] via SUBCUTANEOUS
  Administered 2016-07-15: 3 [IU] via SUBCUTANEOUS
  Administered 2016-07-15 (×2): 5 [IU] via SUBCUTANEOUS
  Administered 2016-07-16: 2 [IU] via SUBCUTANEOUS
  Administered 2016-07-16: 3 [IU] via SUBCUTANEOUS
  Administered 2016-07-16 – 2016-07-17 (×2): 2 [IU] via SUBCUTANEOUS
  Administered 2016-07-17 (×2): 3 [IU] via SUBCUTANEOUS
  Administered 2016-07-18: 1 [IU] via SUBCUTANEOUS
  Administered 2016-07-18: 2 [IU] via SUBCUTANEOUS
  Administered 2016-07-18: 5 [IU] via SUBCUTANEOUS
  Administered 2016-07-19: 2 [IU] via SUBCUTANEOUS

## 2016-07-09 MED ORDER — HEPARIN (PORCINE) IN NACL 100-0.45 UNIT/ML-% IJ SOLN
1350.0000 [IU]/h | INTRAMUSCULAR | Status: DC
  Administered 2016-07-09: 1200 [IU]/h via INTRAVENOUS
  Filled 2016-07-09: qty 250

## 2016-07-09 NOTE — Progress Notes (Signed)
Paged Fellow on call due to Pt.s CBG being 512. Waited 90 mins after patient ate to check CBG. Will give the 9 units no further orders placed.

## 2016-07-09 NOTE — Progress Notes (Signed)
ANTICOAGULATION CONSULT NOTE - Initial Consult  Pharmacy Consult for Heparin Indication: HVAD  Allergies  Allergen Reactions  . Metformin And Related Nausea And Vomiting    *Only the extended release* (does this)  . Niacin And Related Other (See Comments)    REACTION IS SIDE EFFECT "SEVERE" FLUSHING    Patient Measurements: Height: 6' (182.9 cm) Weight: 224 lb 9.6 oz (101.9 kg) IBW/kg (Calculated) : 77.6 Heparin Dosing Weight: 98kg  Vital Signs: Temp: 98.4 F (36.9 C) (07/08 2001) Temp Source: Oral (07/08 2001) BP: 89/78 (07/08 1711) Pulse Rate: 89 (07/08 2001)  Labs:  Recent Labs  07/07/16 0915 07/08/16 0135 07/08/16 1646 07/09/16 0228 07/09/16 1945  HGB 7.0* 7.9* 8.9* 8.3*  --   HCT 22.5* 24.8* 27.7* 26.0*  --   PLT 192 160 173 153  --   LABPROT 34.2* 30.3*  --  24.3*  23.9* 18.2*  INR 3.29 2.82  --  2.14  2.09 1.50  CREATININE 0.90 0.96  --  0.92  --     Estimated Creatinine Clearance: 104.1 mL/min (by C-G formula based on SCr of 0.92 mg/dL).   Medical History: Past Medical History:  Diagnosis Date  . AICD (automatic cardioverter/defibrillator) present   . Arthritis   . Barretts syndrome   . Carotid artery disease ([facility])    a. Dopp 09/2011: 60-79% RICA, 40-59% LICA.;  b.  Carotid US (7/14):  Bilateral 60-79% => f/u 6 mos  . Cerebrovascular disease   . Chronic systolic heart failure ([facility])   . Coronary artery disease    a. AWMI requiring IABP 2005 s/p Horizon study stent-LAD, staged BMS-Cx, DESx2-RCA. b. Last Last LHC (6/06):  EF 25%, pLAD stent 40-50, after stent 40, dLAD 20, pCFX 20, pOM1 70, pRCA 20, mRCA stents ok.=> med Rx;  c.  Myoview (7/14):  EF 26%, large ant, septal, inf and apical infarct, no ischemia.  Med Rx continued  . Diabetes mellitus   . Dyspnea    with exertion  . Headache   . Heart murmur   . History of kidney stones   . HTN (hypertension)   . Hyperlipidemia    mixed  . ICD (implantable cardiac defibrillator), dual, in situ    St. Jude for severe LVD EF 25% 2/07 explanted 2010. Medtronic Virtuoso II DR Dual-chamber cardiverter-defibrillator  with pocket revision, Dr. Graciela HusbandsKlein  . Ischemic cardiomyopathy    a. Echo (09/27/12): EF 20%, diffuse HK with periapical AK, no LV thrombus noted, restrictive physiology, trivial MR, mild LAE, RVSF mildly reduced, PASP 39  . Pleural effusion 01/05/2016  . Presence of permanent cardiac pacemaker   . PVD (peripheral vascular disease) (H[facility])    a. ABI 09/2011 - R normal, L moderate - saw Dr. Kirke CorinArida - med rx.   Marland Kitchen. RIATA ICD Lead--on advisory recall    . Tobacco abuse    Assessment: 60yom s/p HVAD 5/29 on coumadin pta. Admitted with melena and symptomatic anemia. INR 3.29 on admit and coumadin held. Pharmacy asked to start heparin once INR < 1.8. INR 1.5 this evening. Plan is for EGD tomorrow.   Will aim on the lower end of goal for heparin in the setting of GIB.  Goal of Therapy:  Heparin level 0.3-0.5 units/ml Monitor platelets by anticoagulation protocol: Yes   Plan:  1) Begin heparin at 1200 units with no bolus 2) Check 6 hour heparin level 3) Daily heparin level and CBC  Dalton RiggerMarkle, [name] 07/09/2016,8:41 PM

## 2016-07-09 NOTE — Progress Notes (Signed)
[facility] Progress Note  Subjective: Patient feels a little stronger after transfusion, 1 small dark bowel movement last night hemoglobin 8.3. INR 2.09 this morning  Objective: Vital signs in last 24 hours: Temp:  [97.5 F (36.4 C)-98.9 F (37.2 C)] 98.1 F (36.7 C) (07/08 0612) Pulse Rate:  [78-94] 83 (07/08 0612) Resp:  [18] 18 (07/08 0612) BP: (80-89)/(51-73) 80/68 (07/08 0400) SpO2:  [98 %-100 %] 99 % (07/08 0612) Weight:  [101.9 kg (224 lb 9.6 oz)] 101.9 kg (224 lb 9.6 oz) (07/08 0612) Weight change: -2.495 kg (-5 lb 8 oz)   PE: Unchanged  Lab Results: Results for orders placed or performed during the hospital encounter of 07/07/16 (from the past 24 hour(s))  CBC     Status: Abnormal   Collection Time: 07/08/16  4:46 PM  Result Value Ref Range   WBC 5.9 4.0 - 10.5 K/uL   RBC 3.13 (L) 4.22 - 5.81 MIL/uL   Hemoglobin 8.9 (L) 13.0 - 17.0 g/dL   HCT 16.127.7 (L) 09.639.0 - 04.552.0 %   MCV 88.5 78.0 - 100.0 fL   MCH 28.4 26.0 - 34.0 pg   MCHC 32.1 30.0 - 36.0 g/dL   RDW 40.916.7 (H) 81.111.5 - 91.415.5 %   Platelets 173 150 - 400 K/uL  Glucose, capillary     Status: Abnormal   Collection Time: 07/08/16  8:54 PM  Result Value Ref Range   Glucose-Capillary 362 (H) 65 - 99 mg/dL  Protime-INR     Status: Abnormal   Collection Time: 07/09/16  2:28 AM  Result Value Ref Range   Prothrombin Time 24.3 (H) 11.4 - 15.2 seconds   INR 2.14   Lactate dehydrogenase     Status: None   Collection Time: 07/09/16  2:28 AM  Result Value Ref Range   LDH 169 98 - 192 U/L  Protime-INR     Status: Abnormal   Collection Time: 07/09/16  2:28 AM  Result Value Ref Range   Prothrombin Time 23.9 (H) 11.4 - 15.2 seconds   INR 2.09   CBC     Status: Abnormal   Collection Time: 07/09/16  2:28 AM  Result Value Ref Range   WBC 6.7 4.0 - 10.5 K/uL   RBC 2.92 (L) 4.22 - 5.81 MIL/uL   Hemoglobin 8.3 (L) 13.0 - 17.0 g/dL   HCT 78.226.0 (L) 95.639.0 - 21.352.0 %   MCV 89.0 78.0 - 100.0 fL   MCH 28.4 26.0 - 34.0 pg   MCHC 31.9 30.0 - 36.0 g/dL   RDW 08.617.1 (H) 57.811.5 - 46.915.5 %   Platelets 153 150 - 400 K/uL  Basic metabolic panel     Status: Abnormal   Collection Time: 07/09/16  2:28 AM  Result Value Ref Range   Sodium 132 (L) 135 - 145 mmol/L   Potassium 3.7 3.5 - 5.1 mmol/L   Chloride 98 (L) 101 - 111 mmol/L   CO2 28 22 - 32 mmol/L   Glucose, Bld 283 (H) 65 - 99 mg/dL   BUN 17 6 - 20 mg/dL   Creatinine, Ser 6.290.92 0.61 - 1.24 mg/dL   Calcium 8.3 (L) 8.9 - 10.3 mg/dL   GFR calc non Af Amer >60 >60 mL/min   GFR calc Af Amer >60 >60 mL/min   Anion gap 6 5 - 15  Glucose, capillary     Status: Abnormal   Collection Time: 07/09/16  6:11 AM  Result Value Ref Range   Glucose-Capillary 268 (H) 65 -  99 mg/dL    Studies/Results: No results found.    Assessment: 1. Anemia with dark stool and patient status post HVAD placement on Coumadin, scheduled for EGD with enteroscopy for possible small bowel AVMs as well as standard upper tract lesions, but INR felt to be too high this morning.  Plan: 1. Discussed with cardiology team, we will hold Coumadin 1 more day,  start heparin and tentatively plan procedure for 11:15 tomorrow with ultraslim scope. Heparin to be held at cardiology's discretion for 4-6 hours prior to the procedure.    Kare Dado C 07/09/2016, 10:31 AM  Pager [phone] If no answer or after 5 PM call [phone]

## 2016-07-09 NOTE — Progress Notes (Signed)
New order to start heparin, this RN called pharmacy to clarify the order. Pharmacy is okay to start it and will monitor patient with nursing.

## 2016-07-09 NOTE — Progress Notes (Addendum)
Patient ID: [name], male   DOB: May 28, 1955, 61 y.o.   MRN: [id number]   Advanced Heart Failure VAD Team Note  Subjective:    Continues with melena. Hgb dropped again to 8.3. INR 2.1. Denies pain or SOB. Room smells like melena.   HVAD INTERROGATION:  Flow 5.6 liters/min, peak/trough 7.9/5.4, speed 3000 rpm, power 6.0 W.  No alarms. Lavare on.   Objective:    Vital Signs:   Temp:  [97.8 F (36.6 C)-98.9 F (37.2 C)] 98.1 F (36.7 C) (07/08 0612) Pulse Rate:  [78-94] 83 (07/08 0612) Resp:  [18] 18 (07/08 0612) BP: (80-88)/(51-69) 80/68 (07/08 0400) SpO2:  [98 %-100 %] 99 % (07/08 0612) Weight:  [101.9 kg (224 lb 9.6 oz)] 101.9 kg (224 lb 9.6 oz) (07/08 0612) Last BM Date: 07/08/16 Mean arterial Pressure 80s  Intake/Output:   Intake/Output Summary (Last 24 hours) at 07/09/16 1215 Last data filed at 07/09/16 0800  Gross per 24 hour  Intake              815 ml  Output             1325 ml  Net             -510 ml     Physical Exam    General:  NAD.  HEENT: normal  Neck: supple. JVP 8-9.  Carotids 2+ bilat; no bruits. No lymphadenopathy or thryomegaly appreciated. Cor: LVAD hum.  Lungs: Clear. Abdomen: obese soft, nontender, non-distended. No hepatosplenomegaly. No bruits or masses. Good bowel sounds. Driveline site clean. Anchor in place.  Extremities: no cyanosis, clubbing, rash. Warm no  1+ edema  Neuro: alert & oriented x 3. No focal deficits. Moves all 4 without problem    Telemetry   Personally reviewed, NSR.   Labs   Basic Metabolic Panel:  Recent Labs Lab 07/07/16 0915 07/08/16 0135 07/09/16 0228  NA 132* 132* 132*  K 3.5 3.3* 3.7  CL 101 99* 98*  CO2 25 25 28   GLUCOSE 114* 294* 283*  BUN 26* 25* 17  CREATININE 0.90 0.96 0.92  CALCIUM 8.2* 8.1* 8.3*    Liver Function Tests: No results for input(s): AST, ALT, ALKPHOS, BILITOT, PROT, ALBUMIN in the last 168 hours. No results for input(s): LIPASE, AMYLASE in the last 168 hours. No results  for input(s): AMMONIA in the last 168 hours.  CBC:  Recent Labs Lab 07/07/16 0915 07/08/16 0135 07/08/16 1646 07/09/16 0228  WBC 7.7 6.8 5.9 6.7  HGB 7.0* 7.9* 8.9* 8.3*  HCT 22.5* 24.8* 27.7* 26.0*  MCV 89.6 87.6 88.5 89.0  PLT 192 160 173 153    INR:  Recent Labs Lab 07/07/16 0915 07/08/16 0135 07/09/16 0228  INR 3.29 2.82 2.14  2.09    Other results:   Imaging   No results found.   Medications:     Scheduled Medications: . atorvastatin  40 mg Oral q1800  . cephALEXin  500 mg Oral Q12H  . ezetimibe  10 mg Oral q1800  . ferrous fumarate-b12-vitamic C-folic acid  1 capsule Oral BID BM  . losartan  25 mg Oral BID  . pantoprazole (PROTONIX) IV  40 mg Intravenous Q12H  . potassium chloride  40 mEq Oral Daily  . sildenafil  20 mg Oral TID  . torsemide  80 mg Oral Daily    Infusions: . sodium chloride      PRN Medications: acetaminophen, ondansetron (ZOFRAN) IV   Patient Profile   [name]  is a 61 year old with history of chronic systolic heart failure, recent HVAD implant on 05/30/2016, pulmonary HTN, and PAD.   Assessment/Plan:    1. Anemia/GI bleeding: Patient presented with symptomatic anemia and melena.  Increased fatigue, hemoglobin down to 7.  He has had "diarrhea" since Monday or Tuesday.  INR supratherapeutic at admission.  He had been on ASA 162 mg daily and warfarin. Possible AVM bleeding.   - He has had 3u PRBCs so far and hgb continues to drop. Having ongoing melena. Feels fatigued  - ASA and warfarin held, INR 2.1 today.  - Hold warfarin, will need heparin gtt when INR < 1.8.  - Continue PPI bid.  - Will transfuse another unit PRBCs now - GI consulted. Discussed with Dr. Madilyn Fireman. Plan EGD with push enteroscopy tomorrow at 11am. 2. Chronic systolic CHF: Ischemic cardiomyopathy, St Jude ICD.  He has had trouble with RV failure and volume overload but had been improving on Revatio + torsemide 80 mg bid at home.  HVAD present. HVAD parameters  reviewed and stable.  Flow and waveform look ok.  No suction alarms.  - MAP 80s.   - Continue torsemide 80 mg daily with K replacement (home dose torsemide 80 bid).  JVP is up on exam. But does have RHF. Can give extra diuretics as needed. - Spironolactone at admission with lower BP but he continues on losartan. Will restart spiro. - Continue Revatio.  3. ID:  -Increased drainage around driveline on admit. Started cephalexin. Looks better today. No fevers or chills.  4. Smoking: He has restarted.  Encouraged to quit.  5. Atrial fibrillation: Paroxysmal.  We have stopped amiodarone due to nausea. Remains in NSR today on tele  6. CAD: Stable, continues on statin.  7. Hypokalemia - Supp K. Restart spiro.   I reviewed the LVAD parameters from today, and compared the results to the patient's prior recorded data.  No programming changes were made.  The LVAD is functioning within specified parameters.  The patient performs LVAD self-test daily.  LVAD interrogation was negative for any significant power changes, alarms or PI events/speed drops.  LVAD equipment check completed and is in good working order.  Back-up equipment present.   LVAD education done on emergency procedures and precautions and reviewed exit site care.  Length of Stay: 2  Arvilla Meres, MD 07/09/2016, 12:15 PM  VAD Team --- VAD ISSUES ONLY--- Pager [phone] (7am - 7am)  Advanced Heart Failure Team  Pager [phone] (M-F; 7a - 4p)  Please contact [facility] for night-coverage after hours (4p -7a ) and weekends on [email or web address]

## 2016-07-10 ENCOUNTER — Encounter (HOSPITAL_COMMUNITY): Payer: BLUE CROSS/BLUE SHIELD

## 2016-07-10 ENCOUNTER — Inpatient Hospital Stay (HOSPITAL_COMMUNITY): Payer: BLUE CROSS/BLUE SHIELD | Admitting: Anesthesiology

## 2016-07-10 ENCOUNTER — Encounter (HOSPITAL_COMMUNITY): Payer: Self-pay | Admitting: *Deleted

## 2016-07-10 ENCOUNTER — Encounter (HOSPITAL_COMMUNITY): Payer: Self-pay

## 2016-07-10 ENCOUNTER — Encounter (HOSPITAL_COMMUNITY)
Admission: AD | Disposition: A | Discharge status: Home / Self Care | Payer: Self-pay | Source: Ambulatory Visit | Attending: Cardiology

## 2016-07-10 HISTORY — PX: ENTEROSCOPY: SHX5533

## 2016-07-10 HISTORY — PX: HOT HEMOSTASIS: SHX5433

## 2016-07-10 LAB — PROTIME-INR
INR: 1.48
PROTHROMBIN TIME: 18 s — AB (ref 11.4–15.2)

## 2016-07-10 LAB — TYPE AND SCREEN
ABO/RH(D): A POS
Antibody Screen: NEGATIVE
UNIT DIVISION: 0
UNIT DIVISION: 0
UNIT DIVISION: 0
Unit division: 0

## 2016-07-10 LAB — BPAM RBC
BLOOD PRODUCT EXPIRATION DATE: 201807112359
BLOOD PRODUCT EXPIRATION DATE: 201807112359
BLOOD PRODUCT EXPIRATION DATE: 201807132359
Blood Product Expiration Date: 201807242359
ISSUE DATE / TIME: 201807061639
ISSUE DATE / TIME: 201807071034
ISSUE DATE / TIME: 201807081406
ISSUE DATE / TIME: 201807062008
UNIT TYPE AND RH: 6200
UNIT TYPE AND RH: 6200
UNIT TYPE AND RH: 6200
Unit Type and Rh: 6200

## 2016-07-10 LAB — BASIC METABOLIC PANEL
Anion gap: 5 (ref 5–15)
BUN: 20 mg/dL (ref 6–20)
CHLORIDE: 99 mmol/L — AB (ref 101–111)
CO2: 28 mmol/L (ref 22–32)
CREATININE: 0.9 mg/dL (ref 0.61–1.24)
Calcium: 8.3 mg/dL — ABNORMAL LOW (ref 8.9–10.3)
GFR calc Af Amer: >60 mL/min (ref >60)
GFR calc non Af Amer: >60 mL/min (ref >60)
Glucose, Bld: 156 mg/dL — ABNORMAL HIGH (ref 65–99)
Potassium: 3.6 mmol/L (ref 3.5–5.1)
SODIUM: 132 mmol/L — AB (ref 135–145)

## 2016-07-10 LAB — CBC
HCT: 26.7 % — ABNORMAL LOW (ref 39.0–52.0)
HEMOGLOBIN: 8.6 g/dL — AB (ref 13.0–17.0)
MCH: 28.9 pg (ref 26.0–34.0)
MCHC: 32.2 g/dL (ref 30.0–36.0)
MCV: 89.6 fL (ref 78.0–100.0)
Platelets: 146 10*3/uL — ABNORMAL LOW (ref 150–400)
RBC: 2.98 MIL/uL — ABNORMAL LOW (ref 4.22–5.81)
RDW: 16.8 % — ABNORMAL HIGH (ref 11.5–15.5)
WBC: 7.2 10*3/uL (ref 4.0–10.5)

## 2016-07-10 LAB — GLUCOSE, CAPILLARY
GLUCOSE-CAPILLARY: 176 mg/dL — AB (ref 65–99)
Glucose-Capillary: 201 mg/dL — ABNORMAL HIGH (ref 65–99)
Glucose-Capillary: 258 mg/dL — ABNORMAL HIGH (ref 65–99)
Glucose-Capillary: 253 mg/dL — ABNORMAL HIGH (ref 65–99)

## 2016-07-10 LAB — HEPARIN LEVEL (UNFRACTIONATED): Heparin Unfractionated: 0.15 IU/mL — ABNORMAL LOW (ref 0.30–0.70)

## 2016-07-10 LAB — LACTATE DEHYDROGENASE: LDH: 156 U/L (ref 98–192)

## 2016-07-10 SURGERY — ENTEROSCOPY
Anesthesia: Monitor Anesthesia Care

## 2016-07-10 MED ORDER — PHENYLEPHRINE HCL 10 MG/ML IJ SOLN
INTRAVENOUS | Status: DC | PRN
  Administered 2016-07-10: 25 ug/min via INTRAVENOUS

## 2016-07-10 MED ORDER — PROPOFOL 500 MG/50ML IV EMUL
INTRAVENOUS | Status: DC | PRN
  Administered 2016-07-10: 75 ug/kg/min via INTRAVENOUS

## 2016-07-10 MED ORDER — SODIUM CHLORIDE 0.9 % IV SOLN: INTRAVENOUS | Status: DC

## 2016-07-10 MED ORDER — HEPARIN (PORCINE) IN NACL 100-0.45 UNIT/ML-% IJ SOLN
1500.0000 [IU]/h | INTRAMUSCULAR | Status: DC
  Administered 2016-07-10: 1300 [IU]/h via INTRAVENOUS
  Administered 2016-07-11: 1500 [IU]/h via INTRAVENOUS
  Administered 2016-07-11: 1300 [IU]/h via INTRAVENOUS
  Administered 2016-07-12 – 2016-07-14 (×3): 1500 [IU]/h via INTRAVENOUS
  Filled 2016-07-10 (×5): qty 250

## 2016-07-10 MED ORDER — PROPOFOL 10 MG/ML IV BOLUS
INTRAVENOUS | Status: DC | PRN
  Administered 2016-07-10 (×3): 10 mg via INTRAVENOUS

## 2016-07-10 MED ORDER — LACTATED RINGERS IV SOLN
INTRAVENOUS | Status: DC | PRN
  Administered 2016-07-10: 11:00:00 via INTRAVENOUS

## 2016-07-10 MED ORDER — PHENYLEPHRINE HCL 10 MG/ML IJ SOLN
INTRAMUSCULAR | Status: DC | PRN
  Administered 2016-07-10: 80 ug via INTRAVENOUS

## 2016-07-10 NOTE — H&P (View-Only) (Signed)
[facility] Consult Note  Referring Provider: Kathyrn Lass, MD Primary Care Physician:  Kathyrn Lass, MD Primary Gastroenterologist:  Dr.  Laurel Dimmer Complaint: Melena and low hemoglobin HPI: [name] is an 61 y.o. white male  with cardiomyopathy status post H VAD placement on 5/29. He presented to VAD clinic and found to have a drop in hemoglobin from 9-7 was complaining of dizziness fatigue and some dark stool. The patient had an EGD in 2015 which showed short segment Barrett's esophagus and a colonoscopy in 2008 which showed polyps for which she has never followed up. He is on Coumadin with a presenting INR of 2.82. Reportedly had 2 episodes of melena overnight and was transfused 2 units of packed red blood cells with the resultant hemoglobin of 7.9. He states his stools of been dark for at least 2 weeks and does take iron. He denies any NSAID use. He denies any significant abdominal pain denies nausea or vomiting. He ate a full breakfast this morning.  Past Medical History:  Diagnosis Date  . AICD (automatic cardioverter/defibrillator) present   . Arthritis   . Barretts syndrome   . Carotid artery disease ([place])    a. Dopp 09/2011: 09-81% RICA, 19-14% LICA.;  b.  Carotid US (7/14):  Bilateral 60-79% => f/u 6 mos  . Cerebrovascular disease   . Chronic systolic heart failure ([place])   . Coronary artery disease    a. AWMI requiring IABP 2005 s/p Horizon study stent-LAD, staged BMS-Cx, DESx2-RCA. b. Last Last LHC (6/06):  EF 25%, pLAD stent 40-50, after stent 40, dLAD 20, pCFX 20, pOM1 70, pRCA 20, mRCA stents ok.=> med Rx;  c.  Myoview (7/14):  EF 26%, large ant, septal, inf and apical infarct, no ischemia.  Med Rx continued  . Diabetes mellitus   . Dyspnea    with exertion  . Headache   . Heart murmur   . History of kidney stones   . HTN (hypertension)   . Hyperlipidemia    mixed  . ICD (implantable cardiac defibrillator), dual, in situ    St. Jude for severe LVD EF 25% 2/07  explanted 2010. Medtronic Virtuoso II DR Dual-chamber cardiverter-defibrillator  with pocket revision, Dr. Caryl Comes  . Ischemic cardiomyopathy    a. Echo (09/27/12): EF 20%, diffuse HK with periapical AK, no LV thrombus noted, restrictive physiology, trivial MR, mild LAE, RVSF mildly reduced, PASP 39  . Pleural effusion 01/05/2016  . Presence of permanent cardiac pacemaker   . PVD (peripheral vascular disease) (HCC)    a. ABI 09/2011 - R normal, L moderate - saw Dr. Fletcher Anon - med rx.   Marland Kitchen RIATA ICD Lead--on advisory recall    . Tobacco abuse     Past Surgical History:  Procedure Laterality Date  . ABDOMINAL AORTAGRAM N/A 11/06/2013   Procedure: ABDOMINAL Maxcine Ham;  Surgeon: Angelia Mould, MD;  Location: [facility] CATH LAB;  Service: Cardiovascular;  Laterality: N/A;  . APPENDECTOMY  2000   ruptured  . CARDIAC CATHETERIZATION N/A 01/07/2016   Procedure: Right/Left Heart Cath and Coronary Angiography;  Surgeon: Larey Dresser, MD;  Location: [place] CV LAB;  Service: Cardiovascular;  Laterality: N/A;  . CARDIAC CATHETERIZATION N/A 01/28/2016   Procedure: Coronary Stent Intervention;  Surgeon: Sherren Mocha, MD;  Location: [place] CV LAB;  Service: Cardiovascular;  Laterality: N/A;  . CARDIAC CATHETERIZATION N/A 01/28/2016   Procedure: Right Heart Cath;  Surgeon: Sherren Mocha, MD;  Location: [place] CV LAB;  Service: Cardiovascular;  Laterality: N/A;  . CARDIAC CATHETERIZATION N/A 01/28/2016   Procedure: Intravascular Ultrasound/IVUS;  Surgeon: Sherren Mocha, MD;  Location: [place] CV LAB;  Service: Cardiovascular;  Laterality: N/A;  . CHEST TUBE INSERTION Right 04/07/2016   Procedure: INSERTION PLEURAL DRAINAGE CATHETER;  Surgeon: Ivin Poot, MD;  Location: [place];  Service: Thoracic;  Laterality: Right;  . FEMORAL-POPLITEAL BYPASS GRAFT Right 11/07/2013   Procedure: Right FEMORAL-POPLITEAL ARTERY Bypass ;  Surgeon: Angelia Mould, MD;  Location: [place];  Service: Vascular;   Laterality: Right;  . FEMORAL-POPLITEAL BYPASS GRAFT Left 11/17/2014   Procedure: BYPASS GRAFT FEMORAL-POPLITEAL ARTERY USING GORE PROPATEN 6MM X 80CM VASCULAR GRAFT;  Surgeon: Angelia Mould, MD;  Location: [place];  Service: Vascular;  Laterality: Left;  . IMPLANTABLE CARDIOVERTER DEFIBRILLATOR (ICD) GENERATOR CHANGE N/A 12/16/2012   Procedure: ICD GENERATOR CHANGE;  Surgeon: Deboraha Sprang, MD;  Location: [facility] CATH LAB;  Service: Cardiovascular;  Laterality: N/A;  . INSERTION OF IMPLANTABLE LEFT VENTRICULAR ASSIST DEVICE N/A 05/30/2016   Procedure: INSERTION OF IMPLANTABLE LEFT VENTRICULAR ASSIST DEVICE;  Surgeon: Ivin Poot, MD;  Location: [place];  Service: Open Heart Surgery;  Laterality: N/A;  CIRC ARREST  NITRIC OXIDE  . INTRAOPERATIVE ARTERIOGRAM Right 11/07/2013   Procedure: INTRA OPERATIVE ARTERIOGRAM;  Surgeon: Angelia Mould, MD;  Location: [place];  Service: Vascular;  Laterality: Right;  . LOWER EXTREMITY ANGIOGRAM Bilateral 11/06/2013   Procedure: LOWER EXTREMITY ANGIOGRAM;  Surgeon: Angelia Mould, MD;  Location: [facility] CATH LAB;  Service: Cardiovascular;  Laterality: Bilateral;  . Medtronic Virtuoso II DR dual-chamber cardioverter-defibrillation with pocket revison     Dr. Virl Axe  . MULTIPLE EXTRACTIONS WITH ALVEOLOPLASTY N/A 04/26/2016   Procedure: Extraction of tooth #'s 5-14 and 20-30 with alveoloplasty;  Surgeon: Lenn Cal, DDS;  Location: [place];  Service: Oral Surgery;  Laterality: N/A;  . PERIPHERAL VASCULAR CATHETERIZATION N/A 09/25/2014   Procedure: Abdominal Aortogram;  Surgeon: Elam Dutch, MD;  Location: [place] CV LAB;  Service: Cardiovascular;  Laterality: N/A;  . REMOVAL OF PLEURAL DRAINAGE CATHETER Right 06/13/2016   Procedure: REMOVAL OF PLEURAL DRAINAGE CATHETER;  Surgeon: Ivin Poot, MD;  Location: [place];  Service: Thoracic;  Laterality: Right;  . RIGHT HEART CATH N/A 05/01/2016   Procedure: Right Heart Cath;  Surgeon:  Larey Dresser, MD;  Location: [place] CV LAB;  Service: Cardiovascular;  Laterality: N/A;  . RIGHT HEART CATH N/A 05/25/2016   Procedure: Right Heart Cath;  Surgeon: Larey Dresser, MD;  Location: [place] CV LAB;  Service: Cardiovascular;  Laterality: N/A;  . TEE WITHOUT CARDIOVERSION N/A 05/30/2016   Procedure: TRANSESOPHAGEAL ECHOCARDIOGRAM (TEE);  Surgeon: Prescott Gum, Collier Salina, MD;  Location: [place];  Service: Open Heart Surgery;  Laterality: N/A;  . US GUIDED THORACENTESIS RIGHT ([place] HX) Right 01/05/2016    Medications Prior to Admission  Medication Sig Dispense Refill  . amiodarone (PACERONE) 200 MG tablet Take 1 tablet (200 mg total) by mouth daily. 30 tablet 6  . aspirin 325 MG EC tablet Take 1 tablet (325 mg total) by mouth daily. (Patient taking differently: Take 161 mg by mouth daily. ) 30 tablet 6  . atorvastatin (LIPITOR) 40 MG tablet Take 1 tablet (40 mg total) by mouth daily. 90 tablet 3  . ezetimibe (ZETIA) 10 MG tablet Take 1 tablet (10 mg total) by mouth daily. 90 tablet 3  . ferrous [id number] C-folic acid (TRINSICON / FOLTRIN) capsule Take 1 capsule by  mouth 2 (two) times daily between meals. 60 capsule 6  . insulin NPH-regular Human (NOVOLIN 70/30) (70-30) 100 UNIT/ML injection Inject 15-30 Units into the skin See admin instructions. 30 units in the morning, 15-20 units in the afternoon, 15-20 units in the evening    . losartan (COZAAR) 25 MG tablet Take 1 tablet (25 mg total) by mouth 2 (two) times daily. 60 tablet 6  . pantoprazole (PROTONIX) 40 MG tablet Take 1 tablet (40 mg total) by mouth daily. 30 tablet 6  . potassium chloride SA (K-DUR,KLOR-CON) 20 MEQ tablet Take 1 tablet (20 mEq total) by mouth daily. 90 tablet 3  . sildenafil (REVATIO) 20 MG tablet Take 1 tablet (20 mg total) by mouth 3 (three) times daily. 90 tablet 11  . spironolactone (ALDACTONE) 25 MG tablet Take 1 tablet (25 mg total) by mouth daily. 90 tablet 3  . torsemide (DEMADEX) 20 MG  tablet Take 80 mg twice daily or as directed 180 tablet 3  . warfarin (COUMADIN) 5 MG tablet 10 mg by mouth daily except 12.5 mg on Mondays and Wednesdays (Patient taking differently: Take 10-12.5 mg by mouth See admin instructions. 10 mg by mouth daily except 12.5 mg on Mondays and Wednesdays) 90 tablet 6    Allergies:  Allergies  Allergen Reactions  . Metformin And Related Nausea And Vomiting    *Only the extended release* (does this)  . Niacin And Related Other (See Comments)    REACTION IS SIDE EFFECT "SEVERE" FLUSHING    Family History  Problem Relation Age of Onset  . Diabetes Mother   . Coronary artery disease Father   . Heart disease Father        before age 62    Social History:  reports that he has been smoking Cigarettes.  He has a 22.50 pack-year smoking history. He has never used smokeless tobacco. He reports that he does not drink alcohol or use drugs.  Review of Systems: negative except As above   Blood pressure (!) 88/74, pulse 86, temperature 98.4 F (36.9 C), temperature source Oral, resp. rate 18, height 6' (1.829 m), weight 104.8 kg (231 lb 1.6 oz), SpO2 97 %. Head: Normocephalic, without obvious abnormality, atraumatic Neck: no adenopathy, no carotid bruit, no JVD, supple, symmetrical, trachea midline and thyroid not enlarged, symmetric, no tenderness/mass/nodules Resp: clear to auscultation bilaterally Cardio: regular rate and rhythm, S1, S2 normal, no murmur, click, rub or gallop GI: Abdomen slightly distended generally nontender no masses Extremities: extremities normal, atraumatic, no cyanosis or edema  Results for orders placed or performed during the hospital encounter of 07/07/16 (from the past 48 hour(s))  Type and screen     Status: None (Preliminary result)   Collection Time: 07/07/16  3:44 PM  Result Value Ref Range   ABO/RH(D) A POS    Antibody Screen NEG    Sample Expiration 07/10/2016    Unit Number [id number]    Blood Component Type  RED CELLS,LR    Unit division 00    Status of Unit ISSUED,FINAL    Transfusion Status OK TO TRANSFUSE    Crossmatch Result Compatible    Unit Number [id number]    Blood Component Type RED CELLS,LR    Unit division 00    Status of Unit ISSUED,FINAL    Transfusion Status OK TO TRANSFUSE    Crossmatch Result Compatible    Unit Number [id number]    Blood Component Type RED CELLS,LR    Unit division 00  Status of Unit ALLOCATED    Transfusion Status OK TO TRANSFUSE    Crossmatch Result Compatible   Prepare RBC     Status: None   Collection Time: 07/07/16  3:44 PM  Result Value Ref Range   Order Confirmation ORDER PROCESSED BY BLOOD BANK   Lactate dehydrogenase     Status: None   Collection Time: 07/08/16  1:35 AM  Result Value Ref Range   LDH 175 98 - 192 U/L  Protime-INR     Status: Abnormal   Collection Time: 07/08/16  1:35 AM  Result Value Ref Range   Prothrombin Time 30.3 (H) 11.4 - 15.2 seconds   INR 2.82   CBC     Status: Abnormal   Collection Time: 07/08/16  1:35 AM  Result Value Ref Range   WBC 6.8 4.0 - 10.5 K/uL   RBC 2.83 (L) 4.22 - 5.81 MIL/uL   Hemoglobin 7.9 (L) 13.0 - 17.0 g/dL   HCT 24.8 (L) 39.0 - 52.0 %   MCV 87.6 78.0 - 100.0 fL   MCH 27.9 26.0 - 34.0 pg   MCHC 31.9 30.0 - 36.0 g/dL   RDW 16.6 (H) 11.5 - 15.5 %   Platelets 160 150 - 400 K/uL  Basic metabolic panel     Status: Abnormal   Collection Time: 07/08/16  1:35 AM  Result Value Ref Range   Sodium 132 (L) 135 - 145 mmol/L   Potassium 3.3 (L) 3.5 - 5.1 mmol/L   Chloride 99 (L) 101 - 111 mmol/L   CO2 25 22 - 32 mmol/L   Glucose, Bld 294 (H) 65 - 99 mg/dL   BUN 25 (H) 6 - 20 mg/dL   Creatinine, Ser 0.96 0.61 - 1.24 mg/dL   Calcium 8.1 (L) 8.9 - 10.3 mg/dL   GFR calc non Af Amer >60 >60 mL/min   GFR calc Af Amer >60 >60 mL/min    Comment: (NOTE) The eGFR has been calculated using the CKD EPI equation. This calculation has not been validated in all clinical situations. eGFR's  persistently <60 mL/min signify possible Chronic Kidney Disease.    Anion gap 8 5 - 15  Prepare RBC     Status: None   Collection Time: 07/08/16  8:04 AM  Result Value Ref Range   Order Confirmation ORDER PROCESSED BY BLOOD BANK    No results found.  Assessment: Anemia with suspected GI bleeding on Coumadin after recent H VAD procedure. Plan:  1. IV PPI 2. Reverse Coumadin to the extent allowed by cardiac situation 3. Nothing by mouth after midnight for EGD tomorrow. 4. Will follow with you Codee Tutson C 07/08/2016, 8:21 AM  Pager [phone] If no answer or after 5 PM call [phone]

## 2016-07-10 NOTE — Transfer of Care (Signed)
Immediate Anesthesia Transfer of Care Note  Patient: [name]  Procedure(s) Performed: Procedure(s): ENTEROSCOPY (N/A) HOT HEMOSTASIS (ARGON PLASMA COAGULATION/BICAP) (N/A)  Patient Location: Endoscopy Unit  Anesthesia Type:MAC  Level of Consciousness: awake, alert  and oriented  Airway & Oxygen Therapy: Patient Spontanous Breathing and Patient connected to nasal cannula oxygen  Post-op Assessment: Report given to RN and Post -op Vital signs reviewed and stable  Post vital signs: Reviewed and stable  Last Vitals:  Vitals:   07/10/16 0612 07/10/16 1044  BP:  91/66  Pulse: 79 63  Resp: 18 13  Temp: 36.6 C 36.7 C    Last Pain:  Vitals:   07/10/16 1044  TempSrc: Oral  PainSc:          Complications: No apparent anesthesia complications

## 2016-07-10 NOTE — Progress Notes (Addendum)
ANTICOAGULATION CONSULT NOTE - Initial Consult  Pharmacy Consult for Heparin Indication: HVAD  Allergies  Allergen Reactions  . Metformin And Related Nausea And Vomiting    *Only the extended release* (does this)  . Niacin And Related Other (See Comments)    REACTION IS SIDE EFFECT "SEVERE" FLUSHING    Patient Measurements: Height: 6' (182.9 cm) Weight: 224 lb 9.6 oz (101.9 kg) IBW/kg (Calculated) : 77.6 Heparin Dosing Weight: 98kg  Vital Signs: Temp: 98.7 F (37.1 C) (07/08 2030) Temp Source: Oral (07/08 2030) BP: 83/67 (07/08 2030) Pulse Rate: 89 (07/08 2030)  Labs:  Recent Labs  07/07/16 0915 07/08/16 0135 07/08/16 1646 07/09/16 0228 07/09/16 1945 07/10/16 0255  HGB 7.0* 7.9* 8.9* 8.3*  --  8.6*  HCT 22.5* 24.8* 27.7* 26.0*  --  26.7*  PLT 192 160 173 153  --  146*  LABPROT 34.2* 30.3*  --  24.3*  23.9* 18.2* 18.0*  INR 3.29 2.82  --  2.14  2.09 1.50 1.48  HEPARINUNFRC  --   --   --   --   --  0.15*  CREATININE 0.90 0.96  --  0.92  --   --     Estimated Creatinine Clearance: 104.1 mL/min (by C-G formula based on SCr of 0.92 mg/dL).   Medical History: Past Medical History:  Diagnosis Date  . AICD (automatic cardioverter/defibrillator) present   . Arthritis   . Barretts syndrome   . Carotid artery disease (HCC)    a. Dopp 09/2011: 60-79% RICA, 40-59% LICA.;  b.  Carotid US (7/14):  Bilateral 60-79% => f/u 6 mos  . Cerebrovascular disease   . Chronic systolic heart failure (HCC)   . Coronary artery disease    a. AWMI requiring IABP 2005 s/p Horizon study stent-LAD, staged BMS-Cx, DESx2-RCA. b. Last Last LHC (6/06):  EF 25%, pLAD stent 40-50, after stent 40, dLAD 20, pCFX 20, pOM1 70, pRCA 20, mRCA stents ok.=> med Rx;  c.  Myoview (7/14):  EF 26%, large ant, septal, inf and apical infarct, no ischemia.  Med Rx continued  . Diabetes mellitus   . Dyspnea    with exertion  . Headache   . Heart murmur   . History of kidney stones   . HTN (hypertension)    . Hyperlipidemia    mixed  . ICD (implantable cardiac defibrillator), dual, in situ    St. Jude for severe LVD EF 25% 2/07 explanted 2010. Medtronic Virtuoso II DR Dual-chamber cardiverter-defibrillator  with pocket revision, Dr. Graciela HusbandsKlein  . Ischemic cardiomyopathy    a. Echo (09/27/12): EF 20%, diffuse HK with periapical AK, no LV thrombus noted, restrictive physiology, trivial MR, mild LAE, RVSF mildly reduced, PASP 39  . Pleural effusion 01/05/2016  . Presence of permanent cardiac pacemaker   . PVD (peripheral vascular disease) (HCC)    a. ABI 09/2011 - R normal, L moderate - saw Dr. Kirke CorinArida - med rx.   Marland Kitchen. RIATA ICD Lead--on advisory recall    . Tobacco abuse    Assessment: 60yom s/p HVAD 5/29 on coumadin pta. Admitted with melena and symptomatic anemia. INR 3.29 on admit and coumadin held. Pharmacy asked to start heparin once INR < 1.8. INR 1.5 on 7/8. Plan is for EGD on 7/9.   Heparin level subtherapeutic at 0.15 (drawn ~1 hr early). No issues with heparin gtt/line per RN. Hgb low-stable s/p PRBCs. RN reports no change in bleeding. Will aim on the lower end of goal for heparin in the  setting of GIB.  Goal of Therapy:  Heparin level 0.3-0.5 units/ml Monitor platelets by anticoagulation protocol: Yes   Plan:  Increase heparin gtt to 1350 units/hr Heparin level in 6 hrs Heparin level and CBC daily  Monitor for s/s bleeding   York Cerise, PharmD Clinical Pharmacist 07/10/16 3:53 AM

## 2016-07-10 NOTE — Op Note (Signed)
[facility] Patient Name: [name] Procedure Date : 07/10/2016 MRN: [id number] Attending MD: Nancy Fetter Dr., MD Date of Birth: 1955/09/04 CSN: [id number] Age: 61 Admit Type: Inpatient Procedure:                Upper GI endoscopy with small bowel enteroscopy and                            ablation of gastric AVMs. Indications:              Melena in gentleman with ICD and implantable                            ventricular assist device due to heart failure who                            requires chronic anticoagulation. Providers:                Kingsley Plan, RN, Marcene Duos, Technician,                            [place] Ardine Iacovelli Dr., MD Referring MD:             Kathyrn Lass, Larey Dresser, MD Medicines:                Monitored Anesthesia Care Complications:            No immediate complications. Estimated Blood Loss:     Estimated blood loss: none. Procedure:                Pre-Anesthesia Assessment:                           - Prior to the procedure, a History and Physical                            was performed, and patient medications and                            allergies were reviewed. The patient's tolerance of                            previous anesthesia was also reviewed. The risks                            and benefits of the procedure and the sedation                            options and risks were discussed with the patient.                            All questions were answered, and informed consent                            was obtained. Prior Anticoagulants: The patient has  taken heparin which has been on hold. ASA Grade                            Assessment: III - A patient with severe systemic                            disease. After reviewing the risks and benefits,                            the patient was deemed in satisfactory condition to                            undergo the procedure.                    After obtaining informed consent, the endoscope was                            passed under direct vision. Throughout the                            procedure, the patient's blood pressure, pulse, and                            oxygen saturations were monitored continuously. The                            [id number]-[id number] ([id number]) scope was introduced through                            the mouth, and advanced to the proximal jejunum.                            The upper GI endoscopy was accomplished without                            difficulty. The patient tolerated the procedure                            well. Scope In: Scope Out: Findings:      There were esophageal mucosal changes secondary to established       short-segment Barrett's disease present in the distal esophagus. The       maximum longitudinal extent of these mucosal changes was 3 cm in length.      Two 3 mm stigmata of recent bleeding angiodysplastic lesions were found       in the stomach. Fulguration to ablate the lesion to prevent bleeding by       argon plasma was successful. these 2 lesions were in the mid body of the       stomach 1 on the posterior wall and one on the anterior wall. No other       bleeding lesions were seen in the stomach. a magnet was placed on the       ICD and was then removed.      The examined duodenum was normal.  The examined jejunum was normal. we started the procedure with the small       bowel enteroscope and advanced it to approximately 110 to 120 cm.       Excessive pressure was required to advance further and given the       bleeding lesions in the stomach I elected to come back and treat those       lesions. The proximal jejunum and duodenum were free of bleeding or AVMs       on withdrawal. I withdrew the enteroscope and use the adult endoscope       for ablation of the gastric AVMs. Impression:               - Esophageal mucosal changes secondary to                             established short-segment Barrett's disease.                           - Two recently bleeding angiodysplastic lesions in                            the stomach. Treated with argon plasma coagulation                            (APC).                           - Normal examined duodenum.                           - Normal examined jejunum.                           - No specimens collected. Moderate Sedation:      MAC by anesthesia Recommendation:           - Return patient to hospital ward for ongoing care.                           - Clear liquid diet for 1 day.                           - Continue present medications.                           - No aspirin, ibuprofen, naproxen, or other                            non-steroidal anti-inflammatory drugs. small dose                            aspirin should be reasonably safe. Procedure Code(s):        --- Professional ---                           [phone], Esophagogastroduodenoscopy, flexible,  transoral; with control of bleeding, any method Diagnosis Code(s):        --- Professional ---                           D53.299, Angiodysplasia of stomach and duodenum                            with bleeding                           K22.70, Barrett's esophagus without dysplasia                           K92.1, Melena (includes Hematochezia) CPT copyright 2016 American Medical Association. All rights reserved. The codes documented in this report are preliminary and upon coder review may  be revised to meet current compliance requirements. Nancy Fetter Dr., MD 07/10/2016 12:55:15 PM This report has been signed electronically. Number of Addenda: 0

## 2016-07-10 NOTE — Progress Notes (Signed)
Admitted 07/07/16 due to drop in hemoglobin possible GI bleed.   HeartMate II LVAD implanted on 05/30/16 by PVT.   Vital signs: HR: 63 Doppler Pressure:70 Automatic BP: 91/66(74) O2 Sat: 100 Room Air Wt: 102.3kg    LVAD interrogation reveals:  Speed: 3000 Flow: 5.5 Power:  5.8 Peak/Trough: 7.1/4.8 Alarm Limits: 3/8 Events:  none  Suction alarm on Lavare on   Drive Line: Pt needs daily dressing changes with silver strip.   Blood Products: 2 units PRBC-07/07/16 1 unit PRBC-07/08/16 1 unit PRBC-07/09/16  Labs:  LDH trend:156  INR trend: 2.82> 2.09 > 1.57 > 1.48  Hgb: 8.6  Anticoagulation Plan: -INR Goal: 2-2.5 -ASA Dose: 162 mg  Adverse Events on VAD: GI bleed-07/07/16   Plan/Recommendations:   1. Daily dressing changes.  2. Please page VAD coordinator for Endo procedure today.   Carlton AdamSarah Herbert RN, BSN VAD Coordinator Pager [phone] (7am - 7am)

## 2016-07-10 NOTE — Anesthesia Preprocedure Evaluation (Signed)
Anesthesia Evaluation  Patient identified by MRN, date of birth, ID band Patient awake    Reviewed: Allergy & Precautions, NPO status , Patient's Chart, lab work & pertinent test results  Airway Mallampati: I  TM Distance: >3 FB Neck ROM: Full    Dental   Pulmonary shortness of breath, Current Smoker,    Pulmonary exam normal        Cardiovascular hypertension, + CAD  + pacemaker + Cardiac Defibrillator  Rhythm:Regular  LVAD placed 05/30/16   Neuro/Psych    GI/Hepatic   Endo/Other  diabetes  Renal/GU      Musculoskeletal   Abdominal   Peds  Hematology   Anesthesia Other Findings   Reproductive/Obstetrics                             Anesthesia Physical Anesthesia Plan  ASA: III  Anesthesia Plan: MAC   Post-op Pain Management:    Induction: Intravenous  PONV Risk Score and Plan: 0  Airway Management Planned: Simple Face Mask  Additional Equipment:   Intra-op Plan:   Post-operative Plan:   Informed Consent: I have reviewed the patients History and Physical, chart, labs and discussed the procedure including the risks, benefits and alternatives for the proposed anesthesia with the patient or authorized representative who has indicated his/her understanding and acceptance.     Plan Discussed with: CRNA and Surgeon  Anesthesia Plan Comments:         Anesthesia Quick Evaluation

## 2016-07-10 NOTE — Progress Notes (Signed)
Disability claims and medical record requests for [employer] [facility] [employer] (Fax # [phone]) and [employer] (Fax # [phone]) completed and faxed back. Copied of forms and requests scanned into patient's electronic medical record.  [name], Brigida Scotti [name], RN

## 2016-07-10 NOTE — Progress Notes (Signed)
VAD Coordinator Procedure Note:   Patient underwent Endoscopy by Dr. Randa EvensEdwards. Hemodynamics and VAD parameters monitored by me throughout the procedure. MAPs were obtained with an automatic BP cuff on the right arm.      Doppler Auto cuff(MAP):    Flow: Peak/Trough:   Power:        Speed: Time:          Pre-procedure:74      91/66 (74)       5.9     7/5       5.9          3000             1215   Sedation Induction:        74/64 (70)       5.0     7/4.5       5.8   3000        1220           77/45 (53)       5.4     6.8/4       5.7          3000  1225    Started neo       67/49(56)       5.1     6.4/4       5.5          3000  1230            67/49 (56)       4.5     6.2/3.8      5.4          3000  1235              Interventions:  APC  Performed to 2 AVMs.  Recovery area:    78         5.3      6.4/4.6      5.7         3000         Patient tolerated the procedure well. Peak/Trough were normal throughout the case with no power elevations. After adequate sedation was achieved, pulse ox obtained and maintained >92% throughout the remainder of the procedure. MAPs were 56-74.   Patient Disposition: Pt was escorted back to the unit on batteries and report was given to the bedside nurse. Pt was alert and oriented. VAD numbers were all normal.

## 2016-07-10 NOTE — Anesthesia Postprocedure Evaluation (Signed)
Anesthesia Post Note  Patient: [name]  Procedure(s) Performed: Procedure(s) (LRB): ENTEROSCOPY (N/A) HOT HEMOSTASIS (ARGON PLASMA COAGULATION/BICAP) (N/A)     Patient location during evaluation: PACU Anesthesia Type: MAC Level of consciousness: awake and alert Pain management: pain level controlled Vital Signs Assessment: post-procedure vital signs reviewed and stable Respiratory status: spontaneous breathing, nonlabored ventilation, respiratory function stable and patient connected to nasal cannula oxygen Cardiovascular status: blood pressure returned to baseline and stable Postop Assessment: no signs of nausea or vomiting Anesthetic complications: no    Last Vitals:  Vitals:   07/10/16 1245 07/10/16 1300  BP:    Pulse:    Resp: (!) 27 17  Temp: 36.6 C     Last Pain:  Vitals:   07/10/16 1245  TempSrc: Oral  PainSc:                  Jerica Creegan DAVID

## 2016-07-10 NOTE — Progress Notes (Signed)
ANTICOAGULATION CONSULT NOTE - Follow Up Consult  Pharmacy Consult for Heparin Indication: HVAD  Allergies  Allergen Reactions  . Metformin And Related Nausea And Vomiting    *Only the extended release* (does this)  . Niacin And Related Other (See Comments)    REACTION IS SIDE EFFECT "SEVERE" FLUSHING    Patient Measurements: Height: 6' (182.9 cm) Weight: 225 lb 8 oz (102.3 kg) IBW/kg (Calculated) : 77.6 Heparin Dosing Weight: 98kg  Vital Signs: Temp: 97.9 F (36.6 C) (07/09 1245) Temp Source: Oral (07/09 1245) BP: 91/66 (07/09 1044) Pulse Rate: 63 (07/09 1044)  Labs:  Recent Labs  07/08/16 0135 07/08/16 1646 07/09/16 0228 07/09/16 1945 07/10/16 0255  HGB 7.9* 8.9* 8.3*  --  8.6*  HCT 24.8* 27.7* 26.0*  --  26.7*  PLT 160 173 153  --  146*  LABPROT 30.3*  --  24.3*  23.9* 18.2* 18.0*  INR 2.82  --  2.14  2.09 1.50 1.48  HEPARINUNFRC  --   --   --   --  0.15*  CREATININE 0.96  --  0.92  --  0.90    Estimated Creatinine Clearance: 106.7 mL/min (by C-G formula based on SCr of 0.9 mg/dL).   Medical History: Past Medical History:  Diagnosis Date  . AICD (automatic cardioverter/defibrillator) present   . Arthritis   . Barretts syndrome   . Carotid artery disease (HCC)    a. Dopp 09/2011: 60-79% RICA, 40-59% LICA.;  b.  Carotid US (7/14):  Bilateral 60-79% => f/u 6 mos  . Cerebrovascular disease   . Chronic systolic heart failure (HCC)   . Coronary artery disease    a. AWMI requiring IABP 2005 s/p Horizon study stent-LAD, staged BMS-Cx, DESx2-RCA. b. Last Last LHC (6/06):  EF 25%, pLAD stent 40-50, after stent 40, dLAD 20, pCFX 20, pOM1 70, pRCA 20, mRCA stents ok.=> med Rx;  c.  Myoview (7/14):  EF 26%, large ant, septal, inf and apical infarct, no ischemia.  Med Rx continued  . Diabetes mellitus   . Dyspnea    with exertion  . Headache   . Heart murmur   . History of kidney stones   . HTN (hypertension)   . Hyperlipidemia    mixed  . ICD (implantable  cardiac defibrillator), dual, in situ    St. Jude for severe LVD EF 25% 2/07 explanted 2010. Medtronic Virtuoso II DR Dual-chamber cardiverter-defibrillator  with pocket revision, Dr. Graciela Husbands  . Ischemic cardiomyopathy    a. Echo (09/27/12): EF 20%, diffuse HK with periapical AK, no LV thrombus noted, restrictive physiology, trivial MR, mild LAE, RVSF mildly reduced, PASP 39  . Pleural effusion 01/05/2016  . Presence of permanent cardiac pacemaker   . PVD (peripheral vascular disease) (HCC)    a. ABI 09/2011 - R normal, L moderate - saw Dr. Kirke Corin - med rx.   Marland Kitchen RIATA ICD Lead--on advisory recall    . Tobacco abuse    Assessment: 60yom s/p HVAD 5/29 on coumadin pta. Admitted with melena and symptomatic anemia. INR 3.29 on admit and coumadin held. Pharmacy asked to start heparin once INR < 1.8. INR 1.5 on 7/8 heparin drip started.  S/p EGD on 7/9 with ablations of gastric AVMs.  H/h stable today. Per GI ok to restart heparin drip 1300 uts/hr  Restart warfarin in 2-3 days. .  Goal of Therapy:  Heparin level 0.3-0.5 units/ml Monitor platelets by anticoagulation protocol: Yes   Plan:  Increase heparin gtt to 1300 units/hr  Heparin level in 6 hrs Heparin level and CBC daily  Monitor for s/s bleeding   Leota SauersLisa Kamden Reber Pharm.D. CPP, BCPS Clinical Pharmacist [phone] 07/10/2016 3:57 PM

## 2016-07-10 NOTE — Progress Notes (Signed)
Patient ID: [name], male   DOB: September 17, 1955, 61 y.o.   MRN: [id number]   Advanced Heart Failure VAD Team Note  Subjective:    Admitted with GI bleed. Overall received 3UPRBCs.   Today hgb 8.6.   Complaining of fatigue. Multiple black bowel movements   HVAD INTERROGATION:  Flow 5.8 liters/min, peak/trough 6.9/5, speed 3000 rpm, power 5.9 W.  No alarms. Lavare on.   Objective:    Vital Signs:   Temp:  [97.9 F (36.6 C)-98.7 F (37.1 C)] 97.9 F (36.6 C) (07/09 0612) Pulse Rate:  [79-90] 79 (07/09 0612) Resp:  [16-18] 18 (07/09 0612) BP: (83-98)/(66-86) 86/66 (07/09 0402) SpO2:  [98 %-100 %] 100 % (07/09 0612) Weight:  [225 lb 8 oz (102.3 kg)] 225 lb 8 oz (102.3 kg) (07/09 0612) Last BM Date: 07/09/16 Mean arterial Pressure 70s  Intake/Output:   Intake/Output Summary (Last 24 hours) at 07/10/16 0744 Last data filed at 07/10/16 0339  Gross per 24 hour  Intake           1241.4 ml  Output             2300 ml  Net          -1058.6 ml     Physical Exam    Physical Exam: GENERAL: Pale. Chroncially ill. In bed.  HEENT: normal  NECK: Supple, JVP 7-8  .  2+ bilaterally, no bruits.  No lymphadenopathy or thyromegaly appreciated.   CARDIAC:  Mechanical heart sounds with LVAD hum present.  LUNGS:  Clear to auscultation bilaterally.  ABDOMEN:  Soft, round, nontender, positive bowel sounds x4.     LVAD exit site: well-healed and incorporated.  Dressing dry and intact.  No erythema or drainage.  Stabilization device present and accurately applied.  Driveline dressing is being changed daily per sterile technique. EXTREMITIES:  Warm and dry, no cyanosis, clubbing, rash or trace edema  NEUROLOGIC:  Alert and oriented x 4.  Gait steady.  No aphasia.  No dysarthria.  Affect pleasant.       Telemetry   NSR personally reviewed.   Labs   Basic Metabolic Panel:  Recent Labs Lab 07/07/16 0915 07/08/16 0135 07/09/16 0228 07/10/16 0255  NA 132* 132* 132* 132*  K 3.5 3.3*  3.7 3.6  CL 101 99* 98* 99*  CO2 25 25 28 28   GLUCOSE 114* 294* 283* 156*  BUN 26* 25* 17 20  CREATININE 0.90 0.96 0.92 0.90  CALCIUM 8.2* 8.1* 8.3* 8.3*    Liver Function Tests: No results for input(s): AST, ALT, ALKPHOS, BILITOT, PROT, ALBUMIN in the last 168 hours. No results for input(s): LIPASE, AMYLASE in the last 168 hours. No results for input(s): AMMONIA in the last 168 hours.  CBC:  Recent Labs Lab 07/07/16 0915 07/08/16 0135 07/08/16 1646 07/09/16 0228 07/10/16 0255  WBC 7.7 6.8 5.9 6.7 7.2  HGB 7.0* 7.9* 8.9* 8.3* 8.6*  HCT 22.5* 24.8* 27.7* 26.0* 26.7*  MCV 89.6 87.6 88.5 89.0 89.6  PLT 192 160 173 153 146*    INR:  Recent Labs Lab 07/07/16 0915 07/08/16 0135 07/09/16 0228 07/09/16 1945 07/10/16 0255  INR 3.29 2.82 2.14  2.09 1.50 1.48    Other results:   Imaging   No results found.   Medications:     Scheduled Medications: . atorvastatin  40 mg Oral q1800  . cephALEXin  500 mg Oral Q12H  . ezetimibe  10 mg Oral q1800  . ferrous fumarate-b12-vitamic C-folic acid  1 capsule Oral BID BM  . insulin aspart  0-5 Units Subcutaneous QHS  . insulin aspart  0-9 Units Subcutaneous TID WC  . losartan  25 mg Oral BID  . pantoprazole (PROTONIX) IV  40 mg Intravenous Q12H  . potassium chloride  40 mEq Oral Daily  . sildenafil  20 mg Oral TID  . spironolactone  25 mg Oral Daily  . torsemide  80 mg Oral Daily    Infusions: . sodium chloride    . sodium chloride    . heparin 1,350 Units/hr (07/10/16 0402)    PRN Medications: acetaminophen, ondansetron (ZOFRAN) IV   Patient Profile   [name] is a 61 year old with history of chronic systolic heart failure, recent HVAD implant on 05/30/2016, pulmonary HTN, and PAD.   Assessment/Plan:    1. Anemia/GI bleeding: Patient presented with symptomatic anemia and melena.  Increased fatigue, hemoglobin down to 7.  He has had "diarrhea" since Monday or Tuesday.  INR supratherapeutic at admission.  He  had been on ASA 162 mg daily and warfarin. Possible AVM bleeding.   - He has had 3u PRBCs so far. Hgb 8.6.   - ASA and warfarin held, INR 1.48. Start heparin after GI procedure today.  - Hold warfarin - Continue PPI bid.  - 2. Chronic systolic CHF: Ischemic cardiomyopathy, St Jude ICD.  He has had trouble with RV failure and volume overload but had been improving on Revatio + torsemide 80 mg bid at home.  HVAD present. HVAD parameters reviewed and stable.    - Hold diuretics until after procedure. Maps soft.   But does have RHF. Can give extra diuretics as needed after GI procedure.  - Continue Revatio.  3. ID:  -Increased drainage around driveline on admit. Started cephalexin.  Marland Kitchen. No fevers or chills. WBC ok.  4. Smoking: He has restarted.  Encouraged to quit.  5. Atrial fibrillation: Paroxysmal.  We have stopped amiodarone due to nausea. Remains in NSR today on tele  6. CAD: Stable, continues on statin.  7. Hypokalemia - Supp K. Hold spiro.   I reviewed the LVAD parameters from today, and compared the results to the patient's prior recorded data.  No programming changes were made.  The LVAD is functioning within specified parameters.  The patient performs LVAD self-test daily.  LVAD interrogation was negative for any significant power changes, alarms or PI events/speed drops.  LVAD equipment check completed and is in good working order.  Back-up equipment present.   LVAD education done on emergency procedures and precautions and reviewed exit site care.  Length of Stay: 3  Tonye BecketAmy Clegg, NP 07/10/2016, 7:44 AM  VAD Team --- VAD ISSUES ONLY--- Pager [phone] (7am - 7am)  Advanced Heart Failure Team  Pager [phone] (M-F; 7a - 4p)  Please contact [facility] for night-coverage after hours (4p -7a ) and weekends on [email or web address]  Patient seen and examined with Tonye BecketAmy Clegg, NP. We discussed all aspects of the encounter. I agree with the assessment and plan as stated above.   Continues to  bleed. Feels weak. INR down. Was on heparin but now stopped. I discussed with GI this am and will plan for EGD with push enteroscopy later this am.   VAD parameters stable.   Arvilla MeresBensimhon, Garrell Flagg, MD  11:22 AM

## 2016-07-10 NOTE — Interval H&P Note (Signed)
History and Physical Interval Note:  07/10/2016 11:17 AM  Dalton RainwaterLarry D [name]  has presented today for surgery, with the diagnosis of Melena, anemia  The various methods of treatment have been discussed with the patient and family. After consideration of risks, benefits and other options for treatment, the patient has consented to  Procedure(s): ESOPHAGOGASTRODUODENOSCOPY (EGD) (N/A) as a surgical intervention .  The patient's history has been reviewed, patient examined, no change in status, stable for surgery.  I have reviewed the patient's chart and labs.  Questions were answered to the patient's satisfaction.     Raghad Lorenz JR,Sable Knoles L

## 2016-07-10 NOTE — Anesthesia Procedure Notes (Signed)
Procedure Name: MAC Date/Time: 07/10/2016 12:08 PM Performed by: Candis Shine Pre-anesthesia Checklist: Patient identified, Emergency Drugs available, Suction available, Patient being monitored and Timeout performed Patient Re-evaluated:Patient Re-evaluated prior to inductionDental Injury: Teeth and Oropharynx as per pre-operative assessment

## 2016-07-11 ENCOUNTER — Encounter (HOSPITAL_COMMUNITY)
Admission: AD | Disposition: A | Discharge status: Home / Self Care | Payer: Self-pay | Source: Ambulatory Visit | Attending: Cardiology

## 2016-07-11 LAB — BASIC METABOLIC PANEL
Anion gap: 5 (ref 5–15)
BUN: 15 mg/dL (ref 6–20)
CALCIUM: 8.5 mg/dL — AB (ref 8.9–10.3)
CHLORIDE: 101 mmol/L (ref 101–111)
CO2: 28 mmol/L (ref 22–32)
CREATININE: 0.65 mg/dL (ref 0.61–1.24)
GFR calc Af Amer: >60 mL/min (ref >60)
GFR calc non Af Amer: >60 mL/min (ref >60)
Glucose, Bld: 217 mg/dL — ABNORMAL HIGH (ref 65–99)
Potassium: 4.3 mmol/L (ref 3.5–5.1)
SODIUM: 134 mmol/L — AB (ref 135–145)

## 2016-07-11 LAB — LACTATE DEHYDROGENASE: LDH: 155 U/L (ref 98–192)

## 2016-07-11 LAB — CBC
HEMATOCRIT: 26.6 % — AB (ref 39.0–52.0)
Hemoglobin: 8.4 g/dL — ABNORMAL LOW (ref 13.0–17.0)
MCH: 28.9 pg (ref 26.0–34.0)
MCHC: 31.6 g/dL (ref 30.0–36.0)
MCV: 91.4 fL (ref 78.0–100.0)
Platelets: 142 10*3/uL — ABNORMAL LOW (ref 150–400)
RBC: 2.91 MIL/uL — ABNORMAL LOW (ref 4.22–5.81)
RDW: 17.4 % — AB (ref 11.5–15.5)
WBC: 6.3 10*3/uL (ref 4.0–10.5)

## 2016-07-11 LAB — PROTIME-INR
INR: 1.26
Prothrombin Time: 15.9 seconds — ABNORMAL HIGH (ref 11.4–15.2)

## 2016-07-11 LAB — GLUCOSE, CAPILLARY
GLUCOSE-CAPILLARY: 279 mg/dL — AB (ref 65–99)
GLUCOSE-CAPILLARY: 204 mg/dL — AB (ref 65–99)
Glucose-Capillary: 189 mg/dL — ABNORMAL HIGH (ref 65–99)
Glucose-Capillary: 237 mg/dL — ABNORMAL HIGH (ref 65–99)
Glucose-Capillary: 209 mg/dL — ABNORMAL HIGH (ref 65–99)

## 2016-07-11 LAB — HEPARIN LEVEL (UNFRACTIONATED)
HEPARIN UNFRACTIONATED: 0.19 [IU]/mL — AB (ref 0.30–0.70)
Heparin Unfractionated: 0.33 IU/mL (ref 0.30–0.70)

## 2016-07-11 SURGERY — ENTEROSCOPY
Anesthesia: Monitor Anesthesia Care

## 2016-07-11 MED ORDER — TORSEMIDE 20 MG PO TABS
40.0000 mg | ORAL_TABLET | Freq: Every day | ORAL | Status: DC
  Administered 2016-07-12 – 2016-07-16 (×5): 40 mg via ORAL
  Filled 2016-07-11 (×5): qty 2

## 2016-07-11 NOTE — Progress Notes (Signed)
Subjective: Still black stools.  Objective: Vital signs in last 24 hours: Temp:  [98.2 F (36.8 C)-98.4 F (36.9 C)] 98.4 F (36.9 C) (07/10 0514) Pulse Rate:  [78] 78 (07/10 0514) Resp:  [17-18] 18 (07/10 0514) SpO2:  [100 %] 100 % (07/10 0514) Weight:  [102.3 kg (225 lb 9.6 oz)] 102.3 kg (225 lb 9.6 oz) (07/10 0514) Weight change: 0.045 kg (1.6 oz) Last BM Date: 07/10/16  PE: GEN:  Overweight, NAD ABD:  Protuberant  Lab Results: CBC    Component Value Date/Time   WBC 6.3 07/11/2016 0032   RBC 2.91 (L) 07/11/2016 0032   HGB 8.4 (L) 07/11/2016 0032   HCT 26.6 (L) 07/11/2016 0032   PLT 142 (L) 07/11/2016 0032   MCV 91.4 07/11/2016 0032   MCH 28.9 07/11/2016 0032   MCHC 31.6 07/11/2016 0032   RDW 17.4 (H) 07/11/2016 0032   LYMPHSABS 1.1 06/13/2016 0234   MONOABS 0.5 06/13/2016 0234   EOSABS 0.2 06/13/2016 0234   BASOSABS 0.0 06/13/2016 0234   CMP     Component Value Date/Time   NA 134 (L) 07/11/2016 0032   K 4.3 07/11/2016 0032   CL 101 07/11/2016 0032   CO2 28 07/11/2016 0032   GLUCOSE 217 (H) 07/11/2016 0032   BUN 15 07/11/2016 0032   CREATININE 0.65 07/11/2016 0032   CREATININE 0.77 04/05/2015 0950   CALCIUM 8.5 (L) 07/11/2016 0032   PROT 7.4 06/28/2016 0905   ALBUMIN 2.8 (L) 06/28/2016 0905   AST 27 06/28/2016 0905   ALT 26 06/28/2016 0905   ALKPHOS 173 (H) 06/28/2016 0905   BILITOT 0.4 06/28/2016 0905   GFRNONAA >60 07/11/2016 0032   GFRNONAA >89 01/12/2014 0848   GFRAA >60 07/11/2016 0032   GFRAA >89 01/12/2014 0848   Assessment:  1.  Anemia, Hgb stable over past 24 hours. 2.  Dark stools, GI new/old blood versus iron versus multifactorial? 3.  LVAD.  Plan:  1.  Advance diet to full liquids. 2.  Continue PPI. 3.  Heparin gtt until we sort out whether more GI testing is needed. 4.  If persistent drop in Hgb +/- dark stools, might consider colonoscopy +/- capsule endoscopy as next step(s) in management. 5.  [facility] will  follow.    Freddy JakschOUTLAW,[name] M 07/11/2016, 12:57 PM   Pager [phone] If no answer or after 5 PM call [phone]

## 2016-07-11 NOTE — Progress Notes (Signed)
ANTICOAGULATION CONSULT NOTE - Follow Up Consult  Pharmacy Consult for Heparin Indication: HVAD  Allergies  Allergen Reactions  . Metformin And Related Nausea And Vomiting    *Only the extended release* (does this)  . Niacin And Related Other (See Comments)    REACTION IS SIDE EFFECT "SEVERE" FLUSHING    Patient Measurements: Height: 6' (182.9 cm) Weight: 225 lb 9.6 oz (102.3 kg) IBW/kg (Calculated) : 77.6 Heparin Dosing Weight: 98kg  Vital Signs: Temp: 98.4 F (36.9 C) (07/10 0514) Temp Source: Oral (07/10 0514) Pulse Rate: 78 (07/10 0514)  Labs:  Recent Labs  07/09/16 0228 07/09/16 1945 07/10/16 0255 07/11/16 0032  HGB 8.3*  --  8.6* 8.4*  HCT 26.0*  --  26.7* 26.6*  PLT 153  --  146* 142*  LABPROT 24.3*  23.9* 18.2* 18.0* 15.9*  INR 2.14  2.09 1.50 1.48 1.26  HEPARINUNFRC  --   --  0.15* 0.19*  CREATININE 0.92  --  0.90 0.65    Estimated Creatinine Clearance: 120 mL/min (by C-G formula based on SCr of 0.65 mg/dL).    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 Admitted with melena and symptomatic anemia. INR 3.29 on admit and coumadin held.   S/p EGD on 7/9 with ablations of gastric AVMs x2 7/9.  H/h stable today in 8s, no further bleeding noted. Per GI ok to restart heparin drip, currently therapeutic on 1500 units/hr Restart warfarin in 1-2 days.  LDH 155.  Goal of Therapy:  New INR goal 2-2.5 and 81mg  of aspirin Heparin level 0.3-0.5 units/ml Monitor platelets by anticoagulation protocol: Yes   Plan:  Continue heparin gtt at 1500 units/hr Heparin level and CBC daily  Monitor for s/s bleeding   Sheppard CoilFrank Maxx Pham PharmD., BCPS Clinical Pharmacist Pager [phone] 07/11/2016 8:15 AM

## 2016-07-11 NOTE — Progress Notes (Signed)
Inpatient Diabetes Program Recommendations  AACE/ADA: New Consensus Statement on Inpatient Glycemic Control (2015)  Target Ranges:  Prepandial:   less than 140 mg/dL      Peak postprandial:   less than 180 mg/dL (1-2 hours)      Critically ill patients:  140 - 180 mg/dL   Lab Results  Component Value Date   GLUCAP 237 (H) 07/11/2016   HGBA1C 7.6 (H) 05/25/2016    Review of Glycemic Control Results for [name], [name] (MRN [id number]) as of 07/11/2016 12:39  Ref. Range 07/10/2016 10:53 07/10/2016 16:53 07/10/2016 21:14 07/11/2016 06:17 07/11/2016 11:36  Glucose-Capillary Latest Ref Range: 65 - 99 mg/dL 409176 (H) 811258 (H) 914253 (H) 189 (H) 237 (H)   Diabetes history: DM2 Outpatient Diabetes medications: 70/30 30 units breakfast-(15-20 units lunch)-(15-20 units dinner) Current orders for Inpatient glycemic control: Novolog correction 0-9 units tid + 0-5 units hs  Inpatient Diabetes Program Recommendations:  Please consider while in the hospital: -Lantus 20 units -Novolog meal coverage 3 units if eats 50%  Thank you, Darel HongJudy E. Pharaoh Pio, RN, MSN, CDE  Diabetes Coordinator Inpatient Glycemic Control Team Team Pager [phone] (8am-5pm) 07/11/2016 12:41 PM

## 2016-07-11 NOTE — Care Management Note (Addendum)
Case Management Note Donn PieriniKristi Aubrianna Orchard RN, BSN Unit 2W-Case Manager [phone]   Patient Details  Name: Lynda RainwaterLarry D Stickles MRN: [id number] Date of Birth: 1955/09/07  Subjective/Objective:  HVAD (placed 05/30/16) pt admitted with symptomatic anemia- GI workup-  And increased drainage around driveline- on abx                  Action/Plan: PTA pt lived at home with dad to assist- was not [facility] any HH services [facility] plan to d/c home once INR therapeutic no further evidence of bleeding.   Expected Discharge Date:                  Expected Discharge Plan:   Home/self care  In-House Referral:     Discharge planning Services   In progress, will continue to follow  Post Acute Care Choice:    Choice offered to:     DME Arranged:    DME Agency:     HH Arranged:    HH Agency:     Status of Service:     If discussed at Long Length of Stay Meetings, dates discussed:     Discharge Disposition:   Additional Comments:  07/10/16- S/W AMBER C. @ [employer]  [place] : CONTACT FOR D/C PLANNING NEEDS PHONE #  [phone]  X [phone]-- per Verlee Monteora- CMS/CM dept.   Darrold SpanWebster, Johnathon Olden Hall, RN 07/11/2016, 10:19 AM

## 2016-07-11 NOTE — Progress Notes (Signed)
ANTICOAGULATION CONSULT NOTE Pharmacy Consult for Heparin Indication: HVAD  Allergies  Allergen Reactions  . Metformin And Related Nausea And Vomiting    *Only the extended release* (does this)  . Niacin And Related Other (See Comments)    REACTION IS SIDE EFFECT "SEVERE" FLUSHING    Patient Measurements: Height: 6' (182.9 cm) Weight: 225 lb 8 oz (102.3 kg) IBW/kg (Calculated) : 77.6 Heparin Dosing Weight: 98kg  Vital Signs: Temp: 98.2 F (36.8 C) (07/09 2024) Temp Source: Oral (07/09 2024)  Labs:  Recent Labs  07/09/16 0228 07/09/16 1945 07/10/16 0255 07/11/16 0032  HGB 8.3*  --  8.6* 8.4*  HCT 26.0*  --  26.7* 26.6*  PLT 153  --  146* 142*  LABPROT 24.3*  23.9* 18.2* 18.0* 15.9*  INR 2.14  2.09 1.50 1.48 1.26  HEPARINUNFRC  --   --  0.15* 0.19*  CREATININE 0.92  --  0.90 0.65    Estimated Creatinine Clearance: 120 mL/min (by C-G formula based on SCr of 0.65 mg/dL).  Assessment: 61 yo male with h/o HVAD s/p EGD 7/9 and Coumadin on hold, for heparin.  Goal of Therapy:  Heparin level 0.3-0.5 units/ml Monitor platelets by anticoagulation protocol: Yes   Plan:  Increase Heparin 1500 units/hr Check heparin level in 8 hours.   Geannie RisenGreg Aziya Arena, PharmD, BCPS  07/11/2016 1:38 AM

## 2016-07-11 NOTE — Progress Notes (Signed)
Patient ID: [name], male   DOB: 1955/05/31, 61 y.o.   MRN: [id number]   Advanced Heart Failure VAD Team Note  Subjective:    Admitted with GI bleed. Overall received 3UPRBCs. S/P EGD Enteroscopy--> 2 AVMs APC.   Black BM today. Hgb 8.4. Last night started on heparin drip.   Overall feeling ok. Denies dizziness.    HVAD INTERROGATION:  Flow 5.7 liters/min, speed 3000 rpm, power 5.9 W.  Peak 7.2 Trough 4.4 No alarms. Lavare on.   Objective:    Vital Signs:   Temp:  [97.9 F (36.6 C)-98.4 F (36.9 C)] 98.4 F (36.9 C) (07/10 0514) Pulse Rate:  [63-78] 78 (07/10 0514) Resp:  [13-27] 18 (07/10 0514) BP: (91)/(66) 91/66 (07/09 1044) SpO2:  [100 %] 100 % (07/10 0514) Weight:  [102.3 kg (225 lb 9.6 oz)] 102.3 kg (225 lb 9.6 oz) (07/10 0514) Last BM Date: 07/10/16 Mean arterial Pressure 70s  Intake/Output:   Intake/Output Summary (Last 24 hours) at 07/11/16 0948 Last data filed at 07/11/16 0900  Gross per 24 hour  Intake              996 ml  Output                0 ml  Net              996 ml     Physical Exam     Physical Exam: GENERAL: Well appearing, male. In bed.  HEENT: normal  NECK: Supple, JVP 6-7 . Carotids.  2+ bilaterally, no bruits.  No lymphadenopathy or thyromegaly appreciated.   CARDIAC:  Mechanical heart sounds with LVAD hum present.  LUNGS:  Clear to auscultation bilaterally.  ABDOMEN:  Soft, round, nontender, positive bowel sounds x4.     LVAD exit site:   Dressing dry and intact.  No erythema or drainage.  Stabilization device present and accurately applied.  Driveline dressing is being changed daily per sterile technique. EXTREMITIES:  Warm and dry, no cyanosis, clubbing, rash or edema  NEUROLOGIC:  Alert and oriented x 4.  Gait steady.  No aphasia.  No dysarthria.  Affect pleasant.      Telemetry   NSR personally reviewed.   Labs   Basic Metabolic Panel:  Recent Labs Lab 07/07/16 0915 07/08/16 0135 07/09/16 0228 07/10/16 0255  07/11/16 0032  NA 132* 132* 132* 132* 134*  K 3.5 3.3* 3.7 3.6 4.3  CL 101 99* 98* 99* 101  CO2 25 25 28 28 28   GLUCOSE 114* 294* 283* 156* 217*  BUN 26* 25* 17 20 15   CREATININE 0.90 0.96 0.92 0.90 0.65  CALCIUM 8.2* 8.1* 8.3* 8.3* 8.5*    Liver Function Tests: No results for input(s): AST, ALT, ALKPHOS, BILITOT, PROT, ALBUMIN in the last 168 hours. No results for input(s): LIPASE, AMYLASE in the last 168 hours. No results for input(s): AMMONIA in the last 168 hours.  CBC:  Recent Labs Lab 07/08/16 0135 07/08/16 1646 07/09/16 0228 07/10/16 0255 07/11/16 0032  WBC 6.8 5.9 6.7 7.2 6.3  HGB 7.9* 8.9* 8.3* 8.6* 8.4*  HCT 24.8* 27.7* 26.0* 26.7* 26.6*  MCV 87.6 88.5 89.0 89.6 91.4  PLT 160 173 153 146* 142*    INR:  Recent Labs Lab 07/08/16 0135 07/09/16 0228 07/09/16 1945 07/10/16 0255 07/11/16 0032  INR 2.82 2.14  2.09 1.50 1.48 1.26    Other results:   Imaging   No results found.   Medications:  Scheduled Medications: . atorvastatin  40 mg Oral q1800  . cephALEXin  500 mg Oral Q12H  . ezetimibe  10 mg Oral q1800  . ferrous fumarate-b12-vitamic C-folic acid  1 capsule Oral BID BM  . insulin aspart  0-5 Units Subcutaneous QHS  . insulin aspart  0-9 Units Subcutaneous TID WC  . losartan  25 mg Oral BID  . pantoprazole (PROTONIX) IV  40 mg Intravenous Q12H  . potassium chloride  40 mEq Oral Daily  . sildenafil  20 mg Oral TID    Infusions: . sodium chloride    . sodium chloride    . heparin 1,500 Units/hr (07/11/16 0900)    PRN Medications: acetaminophen, ondansetron (ZOFRAN) IV   Patient Profile   [name] is a 61 year old with history of chronic systolic heart failure, recent HVAD implant on 05/30/2016, pulmonary HTN, and PAD.   Assessment/Plan:    1. Anemia/GI bleeding: Patient presented with symptomatic anemia and melena.  Increased fatigue, hemoglobin down to 7.  He has had "diarrhea" since Monday or Tuesday.  INR supratherapeutic  at admission.  He had been on ASA 162 mg daily and warfarin.  - He has had 3u PRBCs so far S/P 07/10/2016 EGD + Enteroscopy-->. 2 AVMs with APC Possible AVM bleeding.   -Todays Hgb is stable 8.4.  - Will hold off asa. Can restart coumadin 2-3 days per GI.  - Continue PPI bid.  - 2. Chronic systolic CHF: Ischemic cardiomyopathy, St Jude ICD.  He has had trouble with RV failure and volume overload but had been improving on Revatio + torsemide 80 mg bid at home.  HVAD present. HVAD parameters reviewed and stable.    Hold diuretics today. Restart torsemide 40 mg daily tomorrow. (prior to admit he was on torsemide 80 mg twice a day) Hold coumadin 2-3 days per GI. On heparin. INR 1.2. INR Goal 2-2.5. No asa for now.   But does have RHF.  - Continue Revatio.  3. ID:  -Increased drainage around driveline on admit. Day 4/7 cephalexin.  Marland Kitchen. No fevers or chills. WBC ok.  4. Smoking: He has restarted.  Encouraged to quit.  5. Atrial fibrillation: Paroxysmal.  We have stopped amiodarone due to nausea. Remains in NSR today on tele  6. CAD: Stable, continues on statin.  7. Hypokalemia -K 4.3 .   No discharge until INR therapeutic and no further evidence of bleeding.  Likely the weekend.   I reviewed the LVAD parameters from today, and compared the results to the patient's prior recorded data.  No programming changes were made.  The LVAD is functioning within specified parameters.  The patient performs LVAD self-test daily.  LVAD interrogation was negative for any significant power changes, alarms or PI events/speed drops.  LVAD equipment check completed and is in good working order.  Back-up equipment present.   LVAD education done on emergency procedures and precautions and reviewed exit site care.  Length of Stay: 4  Tonye BecketAmy Clegg, NP 07/11/2016, 9:48 AM  VAD Team --- VAD ISSUES ONLY--- Pager [phone] (7am - 7am)  Advanced Heart Failure Team  Pager [phone] (M-F; 7a - 4p)  Please contact [facility]  for night-coverage after hours (4p -7a ) and weekends on [email or web address]  Patient seen and examined with Tonye BecketAmy Clegg, NP. We discussed all aspects of the encounter. I agree with the assessment and plan as stated above.   S/p APC for AVMs x 2. Heparin started last night. Still with melena but  may be old blood as hgb stable. Volume status mildly elevated in setting of RV failure so will be careful with diuretics. VAD parameters stable. Continue Keflex for driveline drainage.   Arvilla Meres, MD  8:29 PM

## 2016-07-11 NOTE — Progress Notes (Signed)
Daily drive line dressing change done without difficulty.  Will continue to monitor

## 2016-07-11 NOTE — Progress Notes (Signed)
Admitted 07/07/16 due to drop in hemoglobin possible GI bleed.   HeartMate II LVAD implanted on 05/30/16 by PVT.   Vital signs: HR: 78 Doppler Pressure:76 Automatic BP: not done O2 Sat: 100 Room Air Wt: 102.3kg    LVAD interrogation reveals:  Speed: 3000 Flow: 5.7 Power:  5.9 Peak/Trough: 7/5 Alarm Limits: 3/8 Events:  none  Suction alarm on Lavare on   Drive Line: Pt needs daily dressing changes with silver strip.   Blood Products: 2 units PRBC-07/07/16 1 unit PRBC-07/08/16 1 unit PRBC-07/09/16  Labs:  LDH trend:156>155  INR trend: 2.82> 2.09 > 1.57 > 1.48>1.26  Hgb: 8.6  Gtts: Heparin 1500 u/hr  Anticoagulation Plan: -INR Goal: 2-2.5 -ASA Dose: 81 mg  Adverse Events on VAD: GI bleed-07/07/16   Plan/Recommendations:   1. Daily dressing changes.  2. Continue Heparin bridge.  Carlton AdamSarah Ahan Eisenberger RN, BSN VAD Coordinator Pager [phone] (7am - 7am)

## 2016-07-12 LAB — HEPARIN LEVEL (UNFRACTIONATED): Heparin Unfractionated: 0.33 IU/mL (ref 0.30–0.70)

## 2016-07-12 LAB — GLUCOSE, CAPILLARY
Glucose-Capillary: 174 mg/dL — ABNORMAL HIGH (ref 65–99)
Glucose-Capillary: 274 mg/dL — ABNORMAL HIGH (ref 65–99)
Glucose-Capillary: 236 mg/dL — ABNORMAL HIGH (ref 65–99)
Glucose-Capillary: 153 mg/dL — ABNORMAL HIGH (ref 65–99)

## 2016-07-12 LAB — BASIC METABOLIC PANEL
Anion gap: 4 — ABNORMAL LOW (ref 5–15)
BUN: 6 mg/dL (ref 6–20)
CHLORIDE: 103 mmol/L (ref 101–111)
CO2: 28 mmol/L (ref 22–32)
CREATININE: 0.64 mg/dL (ref 0.61–1.24)
Calcium: 8.7 mg/dL — ABNORMAL LOW (ref 8.9–10.3)
GFR calc Af Amer: >60 mL/min (ref >60)
GFR calc non Af Amer: >60 mL/min (ref >60)
GLUCOSE: 182 mg/dL — AB (ref 65–99)
POTASSIUM: 4.6 mmol/L (ref 3.5–5.1)
Sodium: 135 mmol/L (ref 135–145)

## 2016-07-12 LAB — PROTIME-INR
INR: 1.15
Prothrombin Time: 14.8 seconds (ref 11.4–15.2)

## 2016-07-12 LAB — LACTATE DEHYDROGENASE: LDH: 159 U/L (ref 98–192)

## 2016-07-12 LAB — CBC
HEMATOCRIT: 27.9 % — AB (ref 39.0–52.0)
Hemoglobin: 8.6 g/dL — ABNORMAL LOW (ref 13.0–17.0)
MCH: 28.4 pg (ref 26.0–34.0)
MCHC: 30.8 g/dL (ref 30.0–36.0)
MCV: 92.1 fL (ref 78.0–100.0)
PLATELETS: 149 10*3/uL — AB (ref 150–400)
RBC: 3.03 MIL/uL — ABNORMAL LOW (ref 4.22–5.81)
RDW: 17.8 % — AB (ref 11.5–15.5)
WBC: 5.7 10*3/uL (ref 4.0–10.5)

## 2016-07-12 MED ORDER — WARFARIN - PHARMACIST DOSING INPATIENT
Freq: Every day | Status: DC
  Administered 2016-07-15 – 2016-07-18 (×3)

## 2016-07-12 MED ORDER — PANTOPRAZOLE SODIUM 40 MG PO TBEC
40.0000 mg | DELAYED_RELEASE_TABLET | Freq: Two times a day (BID) | ORAL | Status: DC
  Administered 2016-07-12 – 2016-07-19 (×15): 40 mg via ORAL
  Filled 2016-07-12 (×15): qty 1

## 2016-07-12 MED ORDER — WARFARIN SODIUM 5 MG PO TABS
12.5000 mg | ORAL_TABLET | Freq: Once | ORAL | Status: AC
  Administered 2016-07-12: 12.5 mg via ORAL
  Filled 2016-07-12: qty 3

## 2016-07-12 NOTE — Progress Notes (Signed)
Subjective: Green stool earlier today; no longer black!  Objective: Vital signs in last 24 hours: Temp:  [98.1 F (36.7 C)-98.4 F (36.9 C)] 98.1 F (36.7 C) (07/11 0414) Pulse Rate:  [76] 76 (07/11 0414) Resp:  [17] 17 (07/11 0414) SpO2:  [97 %] 97 % (07/11 0414) Weight:  [102.1 kg (225 lb)] 102.1 kg (225 lb) (07/11 0414) Weight change: -0.272 kg (-9.6 oz) Last BM Date: 07/12/16  PE: GEN:  NAD  Lab Results: CBC    Component Value Date/Time   WBC 5.7 07/12/2016 0315   RBC 3.03 (L) 07/12/2016 0315   HGB 8.6 (L) 07/12/2016 0315   HCT 27.9 (L) 07/12/2016 0315   PLT 149 (L) 07/12/2016 0315   MCV 92.1 07/12/2016 0315   MCH 28.4 07/12/2016 0315   MCHC 30.8 07/12/2016 0315   RDW 17.8 (H) 07/12/2016 0315   LYMPHSABS 1.1 06/13/2016 0234   MONOABS 0.5 06/13/2016 0234   EOSABS 0.2 06/13/2016 0234   BASOSABS 0.0 06/13/2016 0234   Assessment:  1.  Anemia, Hgb stable over past 48 hours. 2.  Dark stools, GI new/old blood versus iron versus multifactorial?  Stools most recently dark green in color, an improvement. 3.  LVAD.  Plan:  1.  Advance diet as tolerated. 2.  PPI indefinitely. 3.  If no further dark stools and hgb stable over the next 24 hours, could probably start oral anticoagulants. 4.  [facility] will follow.   Freddy JakschOUTLAW,Kyle Stansell M 07/12/2016, 1:57 PM   Pager [phone] If no answer or after 5 PM call [phone]

## 2016-07-12 NOTE — Progress Notes (Signed)
Patient ID: [name], male   DOB: 06-30-1955, 61 y.o.   MRN: [id number]   Advanced Heart Failure VAD Team Note  Subjective:    Admitted with GI bleed. Overall received 3UPRBCs. S/P EGD Enteroscopy--> 2 AVMs APC.   Remains on heparin drip. INR 1.15. Hgb stable 8.6 . Had one green BM today. No frank melena or bleeding.  Denies SOB/dizziness. Frustrated wants to go home.    HVAD INTERROGATION:  Flow 5.4 liters/min, speed 3000 rpm, power 5.8 W.  Peak 7.1 Trough 4.3  No alarms. Lavare on.   Objective:    Vital Signs:   Temp:  [98.1 F (36.7 C)-98.4 F (36.9 C)] 98.1 F (36.7 C) (07/11 0414) Pulse Rate:  [76] 76 (07/11 0414) Resp:  [17] 17 (07/11 0414) SpO2:  [97 %] 97 % (07/11 0414) Weight:  [102.1 kg (225 lb)] 102.1 kg (225 lb) (07/11 0414) Last BM Date: 07/12/16 Mean arterial Pressure 70s  Intake/Output:   Intake/Output Summary (Last 24 hours) at 07/12/16 1042 Last data filed at 07/12/16 0700  Gross per 24 hour  Intake            818.5 ml  Output             1275 ml  Net           -456.5 ml     Physical Exam    Physical Exam: GENERAL: Well appearing, male who presents to clinic today in no acute distress. HEENT: normal  NECK: Supple, JVP  .  2+ bilaterally, no bruits.  No lymphadenopathy or thyromegaly appreciated.   CARDIAC:  Mechanical heart sounds with LVAD hum present.  LUNGS:  Clear to auscultation bilaterally.  ABDOMEN:  Soft, round, nontender, positive bowel sounds x4.     LVAD exit site: well-healed and incorporated.  Dressing dry and intact.  No erythema or drainage.  Stabilization device present and accurately applied.  Driveline dressing is being changed daily per sterile technique. EXTREMITIES:  Warm and dry, no cyanosis, clubbing, rash or edema  NEUROLOGIC:  Alert and oriented x 4.  Gait steady.  No aphasia.  No dysarthria.  Affect pleasant.      Telemetry   NSR 70s personally reviewed.   Labs   Basic Metabolic Panel:  Recent Labs Lab  07/08/16 0135 07/09/16 0228 07/10/16 0255 07/11/16 0032 07/12/16 0315  NA 132* 132* 132* 134* 135  K 3.3* 3.7 3.6 4.3 4.6  CL 99* 98* 99* 101 103  CO2 25 28 28 28 28   GLUCOSE 294* 283* 156* 217* 182*  BUN 25* 17 20 15 6   CREATININE 0.96 0.92 0.90 0.65 0.64  CALCIUM 8.1* 8.3* 8.3* 8.5* 8.7*    Liver Function Tests: No results for input(s): AST, ALT, ALKPHOS, BILITOT, PROT, ALBUMIN in the last 168 hours. No results for input(s): LIPASE, AMYLASE in the last 168 hours. No results for input(s): AMMONIA in the last 168 hours.  CBC:  Recent Labs Lab 07/08/16 1646 07/09/16 0228 07/10/16 0255 07/11/16 0032 07/12/16 0315  WBC 5.9 6.7 7.2 6.3 5.7  HGB 8.9* 8.3* 8.6* 8.4* 8.6*  HCT 27.7* 26.0* 26.7* 26.6* 27.9*  MCV 88.5 89.0 89.6 91.4 92.1  PLT 173 153 146* 142* 149*    INR:  Recent Labs Lab 07/09/16 0228 07/09/16 1945 07/10/16 0255 07/11/16 0032 07/12/16 0315  INR 2.14  2.09 1.50 1.48 1.26 1.15    Other results:   Imaging   No results found.   Medications:  Scheduled Medications: . atorvastatin  40 mg Oral q1800  . cephALEXin  500 mg Oral Q12H  . ezetimibe  10 mg Oral q1800  . ferrous fumarate-b12-vitamic C-folic acid  1 capsule Oral BID BM  . insulin aspart  0-5 Units Subcutaneous QHS  . insulin aspart  0-9 Units Subcutaneous TID WC  . losartan  25 mg Oral BID  . pantoprazole (PROTONIX) IV  40 mg Intravenous Q12H  . sildenafil  20 mg Oral TID  . torsemide  40 mg Oral Daily    Infusions: . sodium chloride    . sodium chloride    . heparin 1,500 Units/hr (07/11/16 1935)    PRN Medications: acetaminophen, ondansetron (ZOFRAN) IV   Patient Profile   [name] is a 61 year old with history of chronic systolic heart failure, recent HVAD implant on 05/30/2016, pulmonary HTN, and PAD.   Assessment/Plan:    1. Anemia/GI bleeding: Patient presented with symptomatic anemia and melena.  Increased fatigue, hemoglobin down to 7.  He has had  "diarrhea" since Monday or Tuesday.  INR supratherapeutic at admission.  He had been on ASA 162 mg daily and warfarin.  - He has had 3u PRBCs so far S/P 07/10/2016 EGD + Enteroscopy-->. 2 AVMs with APC Possible AVM bleeding.   Todays hgb 8.6 .  - Will hold off asa. Restart coumadin.   Restart oral protonix.   - 2. Chronic systolic CHF: Ischemic cardiomyopathy, St Jude ICD.  He has had trouble with RV failure and volume overload but had been improving on Revatio + torsemide 80 mg bid at home.  HVAD present. HVAD parameters reviewed and stable.     Restart torsemide 40 mg daily tomorrow. (prior to admit he was on torsemide 80 mg twice a day) Hold coumadin 2-3 days per GI. On heparin until INR 1.8 or greater.  INR 1.15. Goal 2-2.5. Restart coumadin today.  Hold asa.   But does have RHF.  - Continue Revatio.  3. ID:  -Increased drainage around driveline on admit. Day 5/7 cephalexin.  Marland Kitchen. No fevers or chills. WBC ok. CBC in am.  4. Smoking: He has restarted.  Encouraged to quit.  5. Atrial fibrillation: Paroxysmal.  We have stopped amiodarone due to nausea. Remains in NSR today on tele  6. CAD: Stable, continues on statin.  7. Hypokalemia K 4.6. Stable.  .   I reviewed the LVAD parameters from today, and compared the results to the patient's prior recorded data.  No programming changes were made.  The LVAD is functioning within specified parameters.  The patient performs LVAD self-test daily.  LVAD interrogation was negative for any significant power changes, alarms or PI events/speed drops.  LVAD equipment check completed and is in good working order.  Back-up equipment present.   LVAD education done on emergency procedures and precautions and reviewed exit site care.  Length of Stay: 5  Amy Clegg, NP 07/12/2016, 10:42 AM  VAD Team --- VAD ISSUES ONLY--- Pager [phone] (7am - 7am)  Advanced Heart Failure Team  Pager [phone] (M-F; 7a - 4p)  Please contact [facility] for night-coverage  after hours (4p -7a ) and weekends on [email or web address]  Patient seen and examined with Tonye BecketAmy Clegg, NP. We discussed all aspects of the encounter. I agree with the assessment and plan as stated above.   GI bleed seems to have stopped after APC of AVM x 2. Tolerating heparin. HGB stable. Loading warfarin. Volume status ok. VAD parameters stable.   Home  when INR >= 2.0.   Arvilla Meres, MD  10:27 PM

## 2016-07-12 NOTE — Progress Notes (Signed)
ANTICOAGULATION CONSULT NOTE - Follow Up Consult  Pharmacy Consult for Heparin and Coumadin Indication: HVAD  Allergies  Allergen Reactions  . Metformin And Related Nausea And Vomiting    *Only the extended release* (does this)  . Niacin And Related Other (See Comments)    REACTION IS SIDE EFFECT "SEVERE" FLUSHING    Patient Measurements: Height: 6' (182.9 cm) Weight: 225 lb (102.1 kg) IBW/kg (Calculated) : 77.6 Heparin Dosing Weight: 98kg  Vital Signs: Temp: 98.1 F (36.7 C) (07/11 0414) Temp Source: Oral (07/11 0414) Pulse Rate: 76 (07/11 0414)  Labs:  Recent Labs  07/10/16 0255 07/11/16 0032 07/11/16 1013 07/12/16 0315  HGB 8.6* 8.4*  --  8.6*  HCT 26.7* 26.6*  --  27.9*  PLT 146* 142*  --  149*  LABPROT 18.0* 15.9*  --  14.8  INR 1.48 1.26  --  1.15  HEPARINUNFRC 0.15* 0.19* 0.33 0.33  CREATININE 0.90 0.65  --  0.64    Estimated Creatinine Clearance: 119.9 mL/min (by C-G formula based on SCr of 0.64 mg/dL).   Medications:  Heparin @ 1500 units/hr  Assessment: 60yom s/p HVAD 5/29 on coumadin pta. Admitted with melena and symptomatic anemia. INR 3.29 on admit and coumadin held.   S/p EGD on 7/9 with ablations of gastric AVMs x2. Heparin restarted post EGD. Heparin level is therapeutic at 0.33. CBC, LDH stable. Restarting coumadin today, INR 1.15.  Home dose: 10mg  daily except 12.5mg  Mon/Wed  Goal of Therapy:  INR 2-2.5 Monitor platelets by anticoagulation protocol: Yes   Plan:  1) Continue heparin at 1500 units/hr 2) Coumadin 12.5mg  tonight 3) Daily heparin level, CBC, INR  Dalton RiggerMarkle, [name] Sue 07/12/2016,2:16 PM

## 2016-07-12 NOTE — Progress Notes (Signed)
Admitted 07/07/16 due to drop in hemoglobin possible GI bleed.   HeartMate II LVAD implanted on 05/30/16 by PVT.   Vital signs: HR: 78 Doppler Pressure:76 Automatic BP: not done O2 Sat: 100 Room Air Wt: 102.3kg    LVAD interrogation reveals:  Speed: 3000 Flow: 5.7 Power:  5.24 Peak/Trough: 6.6/4.6 Alarm Limits: 3/8 Events:  none HCT: 28  Suction alarm on Lavare on   Drive Line: Pt needs daily dressing changes with silver strip.   Blood Products: 2 units PRBC-07/07/16 1 unit PRBC-07/08/16 1 unit PRBC-07/09/16  Labs:  LDH trend:156>155>159  INR trend: 2.82> 2.09 > 1.57 > 1.48>1.26>1.15  Hgb: 8.6  Gtts: Heparin 1500 u/hr  Anticoagulation Plan: -INR Goal: 2-2.5 -ASA Dose: 81 mg  Adverse Events on VAD: GI bleed-07/07/16   Plan/Recommendations:   1. Daily dressing changes.  2. Continue Heparin bridge. Restart Coumadin today.  Marcellus ScottLesley Wilson RN, VAD Coordinator 24/7 pager [phone]

## 2016-07-13 LAB — GLUCOSE, CAPILLARY
GLUCOSE-CAPILLARY: 369 mg/dL — AB (ref 65–99)
Glucose-Capillary: 256 mg/dL — ABNORMAL HIGH (ref 65–99)
Glucose-Capillary: 326 mg/dL — ABNORMAL HIGH (ref 65–99)
Glucose-Capillary: 299 mg/dL — ABNORMAL HIGH (ref 65–99)

## 2016-07-13 LAB — BASIC METABOLIC PANEL
Anion gap: 6 (ref 5–15)
BUN: 5 mg/dL — AB (ref 6–20)
CALCIUM: 8.7 mg/dL — AB (ref 8.9–10.3)
CO2: 29 mmol/L (ref 22–32)
CREATININE: 0.85 mg/dL (ref 0.61–1.24)
Chloride: 98 mmol/L — ABNORMAL LOW (ref 101–111)
GFR calc Af Amer: >60 mL/min (ref >60)
GLUCOSE: 236 mg/dL — AB (ref 65–99)
Potassium: 4.4 mmol/L (ref 3.5–5.1)
Sodium: 133 mmol/L — ABNORMAL LOW (ref 135–145)

## 2016-07-13 LAB — CBC
HCT: 28.9 % — ABNORMAL LOW (ref 39.0–52.0)
Hemoglobin: 8.9 g/dL — ABNORMAL LOW (ref 13.0–17.0)
MCH: 28.3 pg (ref 26.0–34.0)
MCHC: 30.8 g/dL (ref 30.0–36.0)
MCV: 91.7 fL (ref 78.0–100.0)
PLATELETS: 153 10*3/uL (ref 150–400)
RBC: 3.15 MIL/uL — ABNORMAL LOW (ref 4.22–5.81)
RDW: 18 % — AB (ref 11.5–15.5)
WBC: 5.9 10*3/uL (ref 4.0–10.5)

## 2016-07-13 LAB — HEPARIN LEVEL (UNFRACTIONATED): Heparin Unfractionated: 0.38 IU/mL (ref 0.30–0.70)

## 2016-07-13 LAB — LACTATE DEHYDROGENASE: LDH: 152 U/L (ref 98–192)

## 2016-07-13 LAB — PROTIME-INR
INR: 1.18
PROTHROMBIN TIME: 15 s (ref 11.4–15.2)

## 2016-07-13 MED ORDER — ASPIRIN EC 81 MG PO TBEC
81.0000 mg | DELAYED_RELEASE_TABLET | Freq: Every day | ORAL | Status: DC
  Administered 2016-07-13 – 2016-07-19 (×7): 81 mg via ORAL
  Filled 2016-07-13 (×7): qty 1

## 2016-07-13 MED ORDER — DOCUSATE SODIUM 100 MG PO CAPS
100.0000 mg | ORAL_CAPSULE | Freq: Two times a day (BID) | ORAL | Status: DC
  Administered 2016-07-14 – 2016-07-17 (×6): 100 mg via ORAL
  Filled 2016-07-13 (×11): qty 1

## 2016-07-13 MED ORDER — WARFARIN SODIUM 5 MG PO TABS
12.5000 mg | ORAL_TABLET | Freq: Once | ORAL | Status: AC
  Administered 2016-07-13: 12.5 mg via ORAL
  Filled 2016-07-13: qty 3

## 2016-07-13 NOTE — Progress Notes (Signed)
Subjective: No blood in stool. No abdominal pain.  Objective: Vital signs in last 24 hours: Temp:  [98.2 F (36.8 C)-98.6 F (37 C)] 98.6 F (37 C) (07/12 0443) Pulse Rate:  [78-79] 78 (07/12 0400) Resp:  [17] 17 (07/12 0443) BP: (70-99)/(51-71) 87/64 (07/12 0400) SpO2:  [95 %-100 %] 100 % (07/12 0400) Weight:  [99.7 kg (219 lb 12.8 oz)] 99.7 kg (219 lb 12.8 oz) (07/12 0443) Weight change: -2.359 kg (-5 lb 3.2 oz) Last BM Date: 07/12/16  PE: GEN:  NAD  Lab Results: CBC    Component Value Date/Time   WBC 5.9 07/13/2016 0319   RBC 3.15 (L) 07/13/2016 0319   HGB 8.9 (L) 07/13/2016 0319   HCT 28.9 (L) 07/13/2016 0319   PLT 153 07/13/2016 0319   MCV 91.7 07/13/2016 0319   MCH 28.3 07/13/2016 0319   MCHC 30.8 07/13/2016 0319   RDW 18.0 (H) 07/13/2016 0319   LYMPHSABS 1.1 06/13/2016 0234   MONOABS 0.5 06/13/2016 0234   EOSABS 0.2 06/13/2016 0234   BASOSABS 0.0 06/13/2016 0234   Assessment:  1.  Anemia, Hgb stable over past 3 days. 2.  Melenic stools, resolved. 3.  LVAD.  Plan:  1.  OK to start oral anticoagulant. 2.  Advance diet. 3.  Patient can follow-up with Dr. Randa EvensEdwards, may be candidate for elective routine colonoscopy down-the-road as outpatient. 4.  [facility] will sign-off; please call with questions.  Thank you.   Freddy JakschOUTLAW,[name] 07/13/2016, 9:07 AM   Pager [phone] If no answer or after 5 PM call [phone]

## 2016-07-13 NOTE — Progress Notes (Signed)
Patient ID: [name], male   DOB: 11-04-1955, 61 y.o.   MRN: [id number]   Advanced Heart Failure VAD Team Note  Subjective:    Admitted with GI bleed. Overall received 3UPRBCs. S/P EGD Enteroscopy--> 2 AVMs APC.   Remains on heparin drip. INR 1.18. Hgb 8.9. 1 BM that was green.   Denies dizziness. Denies SOB.   HVAD INTERROGATION:  Flow5.9 liters/min, speed 3000 rpm, power 6.1  W.  Peak 7.5 Trough 4.7   No alarms. Suction on. Lavare on.   Objective:    Vital Signs:   Temp:  [98.2 F (36.8 C)-98.6 F (37 C)] 98.6 F (37 C) (07/12 0443) Pulse Rate:  [78-79] 78 (07/12 0400) Resp:  [17] 17 (07/12 0443) BP: (70-99)/(51-71) 87/64 (07/12 0400) SpO2:  [95 %-100 %] 100 % (07/12 0400) Weight:  [99.7 kg (219 lb 12.8 oz)] 99.7 kg (219 lb 12.8 oz) (07/12 0443) Last BM Date: 07/12/16 Mean arterial Pressure 70s   Intake/Output:   Intake/Output Summary (Last 24 hours) at 07/13/16 0932 Last data filed at 07/13/16 0809  Gross per 24 hour  Intake              840 ml  Output             3825 ml  Net            -2985 ml     Physical Exam     Physical Exam: GENERAL: Well appearing, male. In the bed. NAD.  HEENT: normal  NECK: Supple, JVP 6-7 .  2+ bilaterally, no bruits.  No lymphadenopathy or thyromegaly appreciated.  CARDIAC:  Mechanical heart sounds with LVAD hum present.  LUNGS:  Clear to auscultation bilaterally.  ABDOMEN:  Soft, round, nontender, positive bowel sounds x4.     LVAD exit site:  Dressing dry and intact.  No erythema or drainage.  Stabilization device present and accurately applied.  Driveline dressing is being changed daily per sterile technique. EXTREMITIES:  Warm and dry, no cyanosis, clubbing, rash or edema  NEUROLOGIC:  Alert and oriented x 4.  Gait steady.  No aphasia.  No dysarthria.  Affect pleasant.        Telemetry   NSR 70s personally reviewed.   Labs   Basic Metabolic Panel:  Recent Labs Lab 07/09/16 0228 07/10/16 0255 07/11/16 0032  07/12/16 0315 07/13/16 0319  NA 132* 132* 134* 135 133*  K 3.7 3.6 4.3 4.6 4.4  CL 98* 99* 101 103 98*  CO2 28 28 28 28 29   GLUCOSE 283* 156* 217* 182* 236*  BUN 17 20 15 6  5*  CREATININE 0.92 0.90 0.65 0.64 0.85  CALCIUM 8.3* 8.3* 8.5* 8.7* 8.7*    Liver Function Tests: No results for input(s): AST, ALT, ALKPHOS, BILITOT, PROT, ALBUMIN in the last 168 hours. No results for input(s): LIPASE, AMYLASE in the last 168 hours. No results for input(s): AMMONIA in the last 168 hours.  CBC:  Recent Labs Lab 07/09/16 0228 07/10/16 0255 07/11/16 0032 07/12/16 0315 07/13/16 0319  WBC 6.7 7.2 6.3 5.7 5.9  HGB 8.3* 8.6* 8.4* 8.6* 8.9*  HCT 26.0* 26.7* 26.6* 27.9* 28.9*  MCV 89.0 89.6 91.4 92.1 91.7  PLT 153 146* 142* 149* 153    INR:  Recent Labs Lab 07/09/16 1945 07/10/16 0255 07/11/16 0032 07/12/16 0315 07/13/16 0319  INR 1.50 1.48 1.26 1.15 1.18    Other results:   Imaging   No results found.   Medications:  Scheduled Medications: . atorvastatin  40 mg Oral q1800  . cephALEXin  500 mg Oral Q12H  . ezetimibe  10 mg Oral q1800  . ferrous fumarate-b12-vitamic C-folic acid  1 capsule Oral BID BM  . insulin aspart  0-5 Units Subcutaneous QHS  . insulin aspart  0-9 Units Subcutaneous TID WC  . losartan  25 mg Oral BID  . pantoprazole  40 mg Oral BID  . sildenafil  20 mg Oral TID  . torsemide  40 mg Oral Daily  . Warfarin - Pharmacist Dosing Inpatient   Does not apply q1800    Infusions: . sodium chloride    . sodium chloride    . 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   Assessment/Plan:    1. Anemia/GI bleeding: Patient presented with symptomatic anemia and melena.  Increased fatigue, hemoglobin down to 7.  He has had "diarrhea" since  Monday or Tuesday.  INR supratherapeutic at admission.  He had been on ASA 162 mg daily and warfarin.  - He has had 3u PRBCs so far S/P 07/10/2016 EGD + Enteroscopy-->. 2 AVMs with APC Possible AVM bleeding.   Todays Hgb 8.9   - On coumadin. Restart asa.  GI signed off.  Continue oral PPI.    - 2. Chronic systolic CHF: Ischemic cardiomyopathy, St Jude ICD.  He has had trouble with RV failure and volume overload but had been improving on Revatio + torsemide 80 mg bid at home.  HVAD present. HVAD parameters reviewed and stable.     Volume status stable. Continue torsemide 40 mg daily. (prior to admit he was on torsemide 80 mg twice a day) Back on coumadin. Restart asa 81 mg daily.   On heparin until INR 1.8 or greater.  INR 1.18. Goal 2-2.5.   - Continue Revatio.  3. ID:  -Increased drainage around driveline on admit. Improving. Day 6/7 cephalexin.  Marland Kitchen No fevers or chills. WBC ok. 4. Smoking: He has restarted.  Encouraged to quit.  5. Atrial fibrillation: Paroxysmal.  We have stopped amiodarone due to nausea. Maintaining NSR. Back on coumadin.  6. CAD: Stable, continues on statin. No S/S ischemia 7. Hypokalemia K 4.4 Stable.  Consult cardiac rehab.   I reviewed the LVAD parameters from today, and compared the results to the patient's prior recorded data.  No programming changes were made.  The LVAD is functioning within specified parameters.  The patient performs LVAD self-test daily.  LVAD interrogation was negative for any significant power changes, alarms or PI events/speed drops.  LVAD equipment check completed and is in good working order.  Back-up equipment present.   LVAD education done on emergency procedures and precautions and reviewed exit site care.  D/C once INR 1.8 or greater. Likely next week.   Length of Stay: 6  Amy Clegg, NP 07/13/2016, 9:32 AM  VAD Team --- VAD ISSUES ONLY--- Pager [phone] (7am - 7am)  Advanced Heart Failure Team  Pager [phone] (M-F; 7a - 4p)    Please contact [facility] for night-coverage after hours (4p -7a ) and weekends on [email or web address]   Patient seen and examined with [name], NP. We discussed all aspects of the encounter. I agree with the assessment and plan as stated above.   No further melena. Hgb relatively stable.  Tolerating heparin and coumadin,. Will need to stay in house until INR >= 2.0 with recent VAD implant. VAD parameters stable.   Arvilla Meres, MD  10:21 PM

## 2016-07-13 NOTE — Progress Notes (Signed)
Admitted 07/07/16 due to drop in hemoglobin possible GI bleed.   HeartMate II LVAD implanted on 05/30/16 by PVT.   Vital signs: HR: 78 Doppler Pressure:76 Automatic BP: not done O2 Sat: 100 Room Air Wt: 102.3> 99.7kg    LVAD interrogation reveals:  Speed: 3000 Flow: 5.7 Power:  5.24 Peak/Trough: 7.3/4.9 Alarm Limits: 3.5/8 Events:  1 suction alarm @ 1909 last night HCT: 29  Suction alarm on Lavare on   Drive Line: Pt needs daily dressing changes with silver strip.   Blood Products: 2 units PRBC-07/07/16 1 unit PRBC-07/08/16 1 unit PRBC-07/09/16  Labs:  LDH trend:156>155>159>152  INR trend: 2.82> 2.09 > 1.57 > 1.48>1.26>1.15>1.18  Hgb: 8.6>8.9  Gtts: Heparin 1500 u/hr  Anticoagulation Plan: -INR Goal: 2-2.5 -ASA Dose: 81 mg  Adverse Events on VAD: GI bleed-07/07/16   Pt states that he feels good and is ready go home. Pt is frustrated because he is having to stay in the hospital. Pt reassured and informed we have to wait on his INR to be therapeutic before we can discharge him. Pt understands.  Plan/Recommendations:   1. Daily dressing changes.  2. Continue Heparin bridge.    Carlton AdamSarah Herbert RN, VAD Coordinator 24/7 pager [phone]

## 2016-07-13 NOTE — Progress Notes (Signed)
ANTICOAGULATION CONSULT NOTE - Follow Up Consult  Pharmacy Consult for Heparin and Coumadin Indication: HVAD  Allergies  Allergen Reactions  . Metformin And Related Nausea And Vomiting    *Only the extended release* (does this)  . Niacin And Related Other (See Comments)    REACTION IS SIDE EFFECT "SEVERE" FLUSHING    Patient Measurements: Height: 6' (182.9 cm) Weight: 219 lb 12.8 oz (99.7 kg) IBW/kg (Calculated) : 77.6 Heparin Dosing Weight: 98kg  Vital Signs: Temp: 98.6 F (37 C) (07/12 0443) Temp Source: Oral (07/12 0443) BP: 87/64 (07/12 0400) Pulse Rate: 78 (07/12 0400)  Labs:  Recent Labs  07/11/16 0032 07/11/16 1013 07/12/16 0315 07/13/16 0319  HGB 8.4*  --  8.6* 8.9*  HCT 26.6*  --  27.9* 28.9*  PLT 142*  --  149* 153  LABPROT 15.9*  --  14.8 15.0  INR 1.26  --  1.15 1.18  HEPARINUNFRC 0.19* 0.33 0.33 0.38  CREATININE 0.65  --  0.64 0.85    Estimated Creatinine Clearance: 111.5 mL/min (by C-G formula based on SCr of 0.85 mg/dL).   Medications:  Heparin @ 1500 units/hr  Assessment: 60yom s/p HVAD 5/29 on coumadin pta. Admitted with melena and symptomatic anemia. INR 3.29 on admit and coumadin held.   S/p EGD on 7/9 with ablations of gastric AVMs x2. Heparin restarted post EGD. Heparin level is therapeutic at 0.38. CBC, LDH stable. Coumadin restarted yesterday, INR 1.18 after first dose.  Home dose: 10mg  daily except 12.5mg  Mon/Wed  Goal of Therapy:  INR 2-2.5 Monitor platelets by anticoagulation protocol: Yes   Plan:  1) Continue heparin at 1500 units/hr 2) Repeat coumadin 12.5mg  tonight 3) Daily heparin level, CBC, INR  Fredrik RiggerMarkle, [place] Hester Sue 07/13/2016,9:45 AM

## 2016-07-13 NOTE — Progress Notes (Signed)
Inpatient Diabetes Program Recommendations  AACE/ADA: New Consensus Statement on Inpatient Glycemic Control (2015)  Target Ranges:  Prepandial:   less than 140 mg/dL      Peak postprandial:   less than 180 mg/dL (1-2 hours)      Critically ill patients:  140 - 180 mg/dL   Lab Results  Component Value Date   GLUCAP 256 (H) 07/13/2016   HGBA1C 7.6 (H) 05/25/2016    Review of Glycemic Control Results for [name], [name] (MRN [id number]) as of 07/13/2016 12:02  Ref. Range 07/12/2016 06:14 07/12/2016 12:19 07/12/2016 16:17 07/12/2016 21:12 07/13/2016 06:17  Glucose-Capillary Latest Ref Range: 65 - 99 mg/dL 409174 (H) 811274 (H) 914236 (H) 153 (H) 256 (H)   Diabetes history: DM2 Outpatient Diabetes medications: 70/30 30 units breakfast-(15-20 units lunch)-(15-20 units dinner) Current orders for Inpatient glycemic control: Novolog correction 0-9 units tid + 0-5 units hs  Inpatient Diabetes Program Recommendations:  Please consider while in the hospital: -Lantus 20 units -Novolog meal coverage 3 units if eats 50%  Thank you, Darel HongJudy E. Lamount Bankson, RN, MSN, CDE  Diabetes Coordinator Inpatient Glycemic Control Team Team Pager [phone] (8am-5pm) 07/13/2016 12:03 PM

## 2016-07-14 LAB — BASIC METABOLIC PANEL
Anion gap: 7 (ref 5–15)
BUN: 8 mg/dL (ref 6–20)
CHLORIDE: 97 mmol/L — AB (ref 101–111)
CO2: 28 mmol/L (ref 22–32)
Calcium: 8.3 mg/dL — ABNORMAL LOW (ref 8.9–10.3)
Creatinine, Ser: 0.95 mg/dL (ref 0.61–1.24)
GFR calc non Af Amer: >60 mL/min (ref >60)
GLUCOSE: 270 mg/dL — AB (ref 65–99)
POTASSIUM: 3.8 mmol/L (ref 3.5–5.1)
Sodium: 132 mmol/L — ABNORMAL LOW (ref 135–145)

## 2016-07-14 LAB — HEPARIN LEVEL (UNFRACTIONATED): HEPARIN UNFRACTIONATED: 0.26 [IU]/mL — AB (ref 0.30–0.70)

## 2016-07-14 LAB — GLUCOSE, CAPILLARY
GLUCOSE-CAPILLARY: 264 mg/dL — AB (ref 65–99)
GLUCOSE-CAPILLARY: 329 mg/dL — AB (ref 65–99)
Glucose-Capillary: 345 mg/dL — ABNORMAL HIGH (ref 65–99)

## 2016-07-14 LAB — CBC
HEMATOCRIT: 26 % — AB (ref 39.0–52.0)
HEMOGLOBIN: 8.3 g/dL — AB (ref 13.0–17.0)
MCH: 28.9 pg (ref 26.0–34.0)
MCHC: 31.9 g/dL (ref 30.0–36.0)
MCV: 90.6 fL (ref 78.0–100.0)
Platelets: 143 10*3/uL — ABNORMAL LOW (ref 150–400)
RBC: 2.87 MIL/uL — ABNORMAL LOW (ref 4.22–5.81)
RDW: 17.8 % — AB (ref 11.5–15.5)
WBC: 6.8 10*3/uL (ref 4.0–10.5)

## 2016-07-14 LAB — PROTIME-INR
INR: 1.28
PROTHROMBIN TIME: 16.1 s — AB (ref 11.4–15.2)

## 2016-07-14 LAB — LACTATE DEHYDROGENASE: LDH: 166 U/L (ref 98–192)

## 2016-07-14 MED ORDER — HEPARIN (PORCINE) IN NACL 100-0.45 UNIT/ML-% IJ SOLN
1600.0000 [IU]/h | INTRAMUSCULAR | Status: DC
  Administered 2016-07-14 – 2016-07-18 (×5): 1600 [IU]/h via INTRAVENOUS
  Filled 2016-07-14 (×7): qty 250

## 2016-07-14 MED ORDER — WARFARIN SODIUM 5 MG PO TABS
12.5000 mg | ORAL_TABLET | Freq: Once | ORAL | Status: AC
  Administered 2016-07-14: 12.5 mg via ORAL
  Filled 2016-07-14: qty 3

## 2016-07-14 MED ORDER — INSULIN GLARGINE 100 UNIT/ML ~~LOC~~ SOLN
20.0000 [IU] | Freq: Every day | SUBCUTANEOUS | Status: DC
  Administered 2016-07-14 – 2016-07-19 (×6): 20 [IU] via SUBCUTANEOUS
  Filled 2016-07-14 (×6): qty 0.2

## 2016-07-14 NOTE — Progress Notes (Signed)
Patient ID: [name], male   DOB: June 22, 1955, 61 y.o.   MRN: [id number]   Advanced Heart Failure VAD Team Note  Subjective:    Admitted with GI bleed. Overall received 3UPRBCs. S/P EGD Enteroscopy--> 2 AVMs APC.   Remains on heparin drip. INR 1.28. Hgb down form 8.9>8.3.  No BM over night.   Feeling ok. Denies SOB.   HVAD INTERROGATION:  Flow 5.9liters/min, speed 3000 rpm, power 6 W.  Peak 7.1 Trough 4.9   No alarms. Suction on. Lavare on.   Objective:    Vital Signs:   Temp:  [98.1 F (36.7 C)-98.8 F (37.1 C)] 98.1 F (36.7 C) (07/13 0456) Pulse Rate:  [83] 83 (07/12 1427) Resp:  [16-18] 18 (07/13 0456) BP: (82)/(69) 82/69 (07/12 1427) SpO2:  [98 %] 98 % (07/12 1427) Weight:  [221 lb (100.2 kg)] 221 lb (100.2 kg) (07/13 0455) Last BM Date: 07/06/16 Mean arterial Pressure 70s   Intake/Output:   Intake/Output Summary (Last 24 hours) at 07/14/16 0905 Last data filed at 07/14/16 0417  Gross per 24 hour  Intake              480 ml  Output             2225 ml  Net            -1745 ml     Physical Exam      Physical Exam: GENERAL: Well appearing, male. In bed. NAD HEENT: normal  NECK: Supple, JVP6-7  .  2+ bilaterally, no bruits.  No lymphadenopathy or thyromegaly appreciated.  CARDIAC:  Mechanical heart sounds with LVAD hum present.  LUNGS:  Clear to auscultation bilaterally.  ABDOMEN:  Soft, round, nontender, positive bowel sounds x4.     LVAD exit site: well-healed and incorporated.  Dressing dry and intact.  No erythema or drainage.  Stabilization device present and accurately applied.  Driveline dressing is being changed daily per sterile technique. EXTREMITIES:  Warm and dry, no cyanosis, clubbing, rash or edema  NEUROLOGIC:  Alert and oriented x 4.  Gait steady.  No aphasia.  No dysarthria.  Affect pleasant.        Telemetry   NSR 70s   Labs   Basic Metabolic Panel:  Recent Labs Lab 07/10/16 0255 07/11/16 0032 07/12/16 0315 07/13/16 0319  07/14/16 0241  NA 132* 134* 135 133* 132*  K 3.6 4.3 4.6 4.4 3.8  CL 99* 101 103 98* 97*  CO2 28 28 28 29 28   GLUCOSE 156* 217* 182* 236* 270*  BUN 20 15 6  5* 8  CREATININE 0.90 0.65 0.64 0.85 0.95  CALCIUM 8.3* 8.5* 8.7* 8.7* 8.3*    Liver Function Tests: No results for input(s): AST, ALT, ALKPHOS, BILITOT, PROT, ALBUMIN in the last 168 hours. No results for input(s): LIPASE, AMYLASE in the last 168 hours. No results for input(s): AMMONIA in the last 168 hours.  CBC:  Recent Labs Lab 07/10/16 0255 07/11/16 0032 07/12/16 0315 07/13/16 0319 07/14/16 0241  WBC 7.2 6.3 5.7 5.9 6.8  HGB 8.6* 8.4* 8.6* 8.9* 8.3*  HCT 26.7* 26.6* 27.9* 28.9* 26.0*  MCV 89.6 91.4 92.1 91.7 90.6  PLT 146* 142* 149* 153 143*    INR:  Recent Labs Lab 07/10/16 0255 07/11/16 0032 07/12/16 0315 07/13/16 0319 07/14/16 0241  INR 1.48 1.26 1.15 1.18 1.28    Other results:   Imaging   No results found.   Medications:     Scheduled Medications: .  aspirin EC  81 mg Oral Daily  . atorvastatin  40 mg Oral q1800  . cephALEXin  500 mg Oral Q12H  . docusate sodium  100 mg Oral BID  . ezetimibe  10 mg Oral q1800  . ferrous fumarate-b12-vitamic C-folic acid  1 capsule Oral BID BM  . insulin aspart  0-5 Units Subcutaneous QHS  . insulin aspart  0-9 Units Subcutaneous TID WC  . losartan  25 mg Oral BID  . pantoprazole  40 mg Oral BID  . sildenafil  20 mg Oral TID  . torsemide  40 mg Oral Daily  . Warfarin - Pharmacist Dosing Inpatient   Does not apply q1800    Infusions: . sodium chloride    . sodium chloride    . 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   Assessment/Plan:    1. Anemia/GI bleeding: Patient presented with symptomatic anemia and melena.  Increased  fatigue, hemoglobin down to 7.  He has had "diarrhea" since Monday or Tuesday.  INR supratherapeutic at admission.  He had been on ASA 162 mg daily and warfarin.  - He has had 3u PRBCs. S/P 07/10/2016 EGD + Enteroscopy-->. 2 AVMs with APC Possible AVM bleeding.   Hgb down from 8.9>8.3.  - On coumadin. On lower dose of asa 81 mg daily.   GI signed off.  Continue oral PPI.    - 2. Chronic systolic CHF: Ischemic cardiomyopathy, St Jude ICD.  He has had trouble with RV failure and volume overload but had been improving on Revatio + torsemide 80 mg bid at home.  HVAD present. HVAD parameters reviewed and stable.     Volume status stable. Continue torsemide 40 mg daily. (prior to admit he was on torsemide 80 mg twice a day) Back on coumadin. Restart asa 81 mg daily.   On heparin until INR 1.8 or greater.  INR 1.28. Goal 2-2.5.   - Continue Revatio.  3. ID:  -Increased drainage around driveline on admit. Improving. Day 7/7 cephalexin.  Marland Kitchen. No fevers or chills. WBC ok. 4. Smoking: He has restarted.  Encouraged to quit.  5. Atrial fibrillation: Paroxysmal.  Off amio due to nausea. Maintaining NSR. On coumadin.  6.  CAD: Stable, continues on statin. No S/S ischemiaNo S/S ischemia 7. Hypokalemia K 3.8.   8. Uncontrolled Diabetes: Diabetes Coordinator recommendations appreciated. Continue SSI with addition of lantus 20 units daily.   Consult cardiac rehab.   I reviewed the LVAD parameters from today, and compared the results to the patient's prior recorded data.  No programming changes were made.  The LVAD is functioning within specified parameters.  The patient performs LVAD self-test daily.  LVAD interrogation was negative for any significant power changes, alarms or PI events/speed drops.  LVAD equipment check completed and is in good working order.  Back-up equipment present.   LVAD education done on emergency procedures and precautions and reviewed exit site care.  D/C once INR 2.  Likely next week.    Length of Stay: 7  Amy Clegg, NP 07/14/2016, 9:05 AM  VAD Team --- VAD ISSUES ONLY--- Pager [phone] (7am - 7am)  Advanced Heart Failure Team  Pager [phone] (M-F; 7a - 4p)  Please contact [facility] for night-coverage after hours (4p -7a ) and weekends on  [email or web address]  Patient seen and examined with [name], NP. We discussed all aspects of the encounter. I agree with the assessment and plan as stated above.   No further melena. Volume status looks good. VAD parameters stable. INR 1.3. Continue heparin/coumadin. Can stop heparin when INR > 1.8. Discussed with PharmD.   Arvilla Meres, MD  4:03 PM

## 2016-07-14 NOTE — Progress Notes (Signed)
ANTICOAGULATION CONSULT NOTE - Follow Up Consult  Pharmacy Consult for Heparin and Coumadin Indication: HVAD  Allergies  Allergen Reactions  . Metformin And Related Nausea And Vomiting    *Only the extended release* (does this)  . Niacin And Related Other (See Comments)    REACTION IS SIDE EFFECT "SEVERE" FLUSHING    Patient Measurements: Height: 6' (182.9 cm) Weight: 221 lb (100.2 kg) IBW/kg (Calculated) : 77.6 Heparin Dosing Weight: 98kg  Vital Signs: Temp: 98.1 F (36.7 C) (07/13 0456) Temp Source: Oral (07/13 0456)  Labs:  Recent Labs  07/12/16 0315 07/13/16 0319 07/14/16 0241  HGB 8.6* 8.9* 8.3*  HCT 27.9* 28.9* 26.0*  PLT 149* 153 143*  LABPROT 14.8 15.0 16.1*  INR 1.15 1.18 1.28  HEPARINUNFRC 0.33 0.38 0.26*  CREATININE 0.64 0.85 0.95    Estimated Creatinine Clearance: 100 mL/min (by C-G formula based on SCr of 0.95 mg/dL).   Medications:  Heparin @ 1500 units/hr  Assessment: 60yom s/p HVAD 5/29 on coumadin pta. Admitted with melena and symptomatic anemia. INR 3.29 on admit and coumadin held.   S/p EGD on 7/9 with ablations of gastric AVMs x2. Heparin restarted post EGD. Heparin level below goal at 0.26. Small decrease in Hgb 8.9>8.3, LDH stable. Coumadin restarted 7/11. INR 1.28 today.   Home dose: 10mg  daily except 12.5mg  Mon/Wed  Goal of Therapy:  INR 2-2.5 Monitor platelets by anticoagulation protocol: Yes   Plan:  1) Increase heparin to 1600 units/hr 2) Repeat coumadin 12.5mg  tonight 3) Daily heparin level, CBC, INR  Fredrik RiggerMarkle, Coletta Lockner Sue 07/14/2016,9:17 AM

## 2016-07-14 NOTE — Progress Notes (Signed)
Pt has been walking independently. Will f/u as low priority. Ethelda ChickKristan Deneisha Dade CES, ACSM 1:30 PM 07/14/2016

## 2016-07-14 NOTE — Progress Notes (Signed)
Admitted 07/07/16 due to drop in hemoglobin possible GI bleed.   HeartMate II LVAD implanted on 05/30/16 by PVT.   Vital signs: HR: 79 Doppler Pressure:70 Automatic BP: not done O2 Sat: 100 Room Air Wt: 102.3> 99.7>100.2kg   LVAD interrogation reveals:  Speed: 3000 Flow: 5.7 Power:  5.24 Peak/Trough: 7.3/4.9 Alarm Limits: 3.5/8 Events: none HCT: 26  Suction alarm on Lavare on   Drive Line: Pt needs daily dressing changes with silver strip.   Blood Products: 2 units PRBC-07/07/16 1 unit PRBC-07/08/16 1 unit PRBC-07/09/16  Labs:  LDH trend:156>155>159>152>166  INR trend: 2.82> 2.09 > 1.57 > 1.48>1.26>1.15>1.18>1.28 Hgb: 8.6>8.9  Gtts: Heparin 1500 u/hr  Anticoagulation Plan: -INR Goal: 2-2.5 -ASA Dose: 81 mg  Adverse Events on VAD: GI bleed-07/07/16   Pt states that he feels good and is ready go home. Pt is frustrated because he is having to stay in the hospital. Pt reassured and informed we have to wait on his INR to be therapeutic before we can discharge him. Pt understands.  Plan/Recommendations:   1. Daily dressing changes.  2. Continue Heparin bridge.    Carlton AdamSarah Regis Hinton RN, VAD Coordinator 24/7 pager [phone]

## 2016-07-15 DIAGNOSIS — F17200 Nicotine dependence, unspecified, uncomplicated: Secondary | ICD-10-CM

## 2016-07-15 DIAGNOSIS — K922 Gastrointestinal hemorrhage, unspecified: Secondary | ICD-10-CM

## 2016-07-15 DIAGNOSIS — I2510 Atherosclerotic heart disease of native coronary artery without angina pectoris: Secondary | ICD-10-CM

## 2016-07-15 LAB — GLUCOSE, CAPILLARY
GLUCOSE-CAPILLARY: 164 mg/dL — AB (ref 65–99)
GLUCOSE-CAPILLARY: 232 mg/dL — AB (ref 65–99)
GLUCOSE-CAPILLARY: 255 mg/dL — AB (ref 65–99)
Glucose-Capillary: 255 mg/dL — ABNORMAL HIGH (ref 65–99)

## 2016-07-15 LAB — CBC
HCT: 28.2 % — ABNORMAL LOW (ref 39.0–52.0)
Hemoglobin: 8.8 g/dL — ABNORMAL LOW (ref 13.0–17.0)
MCH: 28.7 pg (ref 26.0–34.0)
MCHC: 31.2 g/dL (ref 30.0–36.0)
MCV: 91.9 fL (ref 78.0–100.0)
PLATELETS: 151 10*3/uL (ref 150–400)
RBC: 3.07 MIL/uL — AB (ref 4.22–5.81)
RDW: 18.1 % — AB (ref 11.5–15.5)
WBC: 6 10*3/uL (ref 4.0–10.5)

## 2016-07-15 LAB — HEPARIN LEVEL (UNFRACTIONATED): HEPARIN UNFRACTIONATED: 0.36 [IU]/mL (ref 0.30–0.70)

## 2016-07-15 LAB — BASIC METABOLIC PANEL
ANION GAP: 6 (ref 5–15)
BUN: 7 mg/dL (ref 6–20)
CALCIUM: 8.8 mg/dL — AB (ref 8.9–10.3)
CO2: 31 mmol/L (ref 22–32)
Chloride: 99 mmol/L — ABNORMAL LOW (ref 101–111)
Creatinine, Ser: 0.86 mg/dL (ref 0.61–1.24)
Glucose, Bld: 267 mg/dL — ABNORMAL HIGH (ref 65–99)
POTASSIUM: 3.8 mmol/L (ref 3.5–5.1)
Sodium: 136 mmol/L (ref 135–145)

## 2016-07-15 LAB — LACTATE DEHYDROGENASE: LDH: 159 U/L (ref 98–192)

## 2016-07-15 LAB — PROTIME-INR
INR: 1.43
PROTHROMBIN TIME: 17.6 s — AB (ref 11.4–15.2)

## 2016-07-15 MED ORDER — POTASSIUM CHLORIDE CRYS ER 10 MEQ PO TBCR
20.0000 meq | EXTENDED_RELEASE_TABLET | Freq: Every day | ORAL | Status: DC
  Administered 2016-07-15 – 2016-07-19 (×5): 20 meq via ORAL
  Filled 2016-07-15 (×8): qty 2

## 2016-07-15 MED ORDER — WARFARIN SODIUM 5 MG PO TABS
12.5000 mg | ORAL_TABLET | Freq: Once | ORAL | Status: AC
  Administered 2016-07-15: 12.5 mg via ORAL
  Filled 2016-07-15: qty 3

## 2016-07-15 NOTE — Progress Notes (Signed)
1308 Pt has been walking in halls independently. Will continue to follow. Luetta NuttingCharlene Delsa Walder RN BSN 07/15/2016 1:09 PM

## 2016-07-15 NOTE — Progress Notes (Signed)
Received page from bedside nurse stating that the pt had no wavefrom system monitor of HVAD. Peak and Trough is 0.7/0.4, Flow is 5.7 and power is 6.1, Pts current speed is 3000. Pts MAP is 78 by doppler, pt reports no dizziness and states that he feels great. Pt is currently on a Heparin gtt and his INR today Is 1.43. Call placed to MD to troubleshoot this situation.

## 2016-07-15 NOTE — Progress Notes (Signed)
Received a page from the bedside nurse stating that after having the pt sit up in bed that his waveform returned with a peak and trough of 7.4/4.7. Pt was lying flat in bed when the waveform was dampened. A call was placed to the heartware rep and a CT was recommended to check for cannula placement. This information was passed along to Dr. Gala RomneyBensimhon. Please call with anymore problems. If the wavefrom dampens again please change pts position or increased the head of the bed.  Carlton AdamSarah Dariana Garbett RN, BSN VAD Coordinator [phone]

## 2016-07-15 NOTE — Progress Notes (Signed)
Received a page from bedside asking if she should hold the Losartan for a dopplered MAP of 78. Nurse instructed it was ok to give Losartan per Dr. Gala RomneyBensimhon.  Carlton AdamSarah Nihal Doan RN, BSN VAD Coordinator [phone]

## 2016-07-15 NOTE — Progress Notes (Signed)
ANTICOAGULATION CONSULT NOTE - Follow Up Consult  Pharmacy Consult for Heparin and Coumadin Indication: HVAD  Allergies  Allergen Reactions  . Metformin And Related Nausea And Vomiting    *Only the extended release* (does this)  . Niacin And Related Other (See Comments)    REACTION IS SIDE EFFECT "SEVERE" FLUSHING    Patient Measurements: Height: 6' (182.9 cm) Weight: 223 lb (101.2 kg) IBW/kg (Calculated) : 77.6 Heparin Dosing Weight: 98kg  Vital Signs: Temp: 98.4 F (36.9 C) (07/14 0551) Temp Source: Oral (07/14 0551)  Labs:  Recent Labs  07/13/16 0319 07/14/16 0241 07/15/16 0457 07/15/16 0458  HGB 8.9* 8.3* 8.8*  --   HCT 28.9* 26.0* 28.2*  --   PLT 153 143* 151  --   LABPROT 15.0 16.1*  --  17.6*  INR 1.18 1.28  --  1.43  HEPARINUNFRC 0.38 0.26*  --  0.36  CREATININE 0.85 0.95 0.86  --     Estimated Creatinine Clearance: 111 mL/min (by C-G formula based on SCr of 0.86 mg/dL).   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 Admitted with melena and symptomatic anemia. INR 3.29 on admit and coumadin held.   S/p EGD on 7/9 with ablations of gastric AVMs x2. Heparin restarted post EGD. Heparin level at goal at 0.36. Small increase in Hgb 8.3>8.8, LDH stable. Coumadin restarted 7/11. INR 1.43 today.   Home dose: 10mg  daily except 12.5mg  Mon/Wed  Goal of Therapy:  INR 2-2.5 Monitor platelets by anticoagulation protocol: Yes   Plan:  1) Continue heparin at 1600 units/hr 2) Repeat warfarin 12.5mg  tonight 3) Daily heparin level, CBC, INR  Dalton PoseyJonathan [name], PharmD Pharmacy Resident Pager #: [phone] 07/15/2016 11:35 AM     I discussed / reviewed the pharmacy note by Dr. Gasper [name] and I agree with the resident's findings and plans as documented.  [name], PharmD Clinical Pharmacist 07/15/2016 4:44 PM

## 2016-07-15 NOTE — Progress Notes (Addendum)
Patient ID: [name], male   DOB: August 22, 1955, 61 y.o.   MRN: [id number]   Advanced Heart Failure VAD Team Note  Subjective:    Admitted with GI bleed. Overall received 3UPRBCs. S/P EGD Enteroscopy--> 2 AVMs APC.   Remains on heparin drip. INR 1.43. Hgb stable 8.8  No further melena. Denies SOB or orthopnea. Anxious to go home.   HVAD INTERROGATION:  Flow 5.7 liters/min, speed 3000 rpm, power 6 W.  Peak 7.0 Trough 4.8   No alarms. Suction on. Lavare on.   Objective:    Vital Signs:   Temp:  [98.1 F (36.7 C)-98.4 F (36.9 C)] 98.4 F (36.9 C) (07/14 0551) Pulse Rate:  [80-83] 80 (07/13 2011) Resp:  [12-18] 12 (07/14 0551) BP: (99)/(68) 99/68 (07/13 1506) SpO2:  [97 %-100 %] 100 % (07/14 0600) Weight:  [101.2 kg (223 lb)] 101.2 kg (223 lb) (07/14 0500) Last BM Date: 07/13/16 Mean arterial Pressure 70s  Intake/Output:   Intake/Output Summary (Last 24 hours) at 07/15/16 1157 Last data filed at 07/15/16 0700  Gross per 24 hour  Intake          1039.73 ml  Output             1350 ml  Net          -310.27 ml     Physical Exam      Physical Exam: General:  NAD.  HEENT: normal  Neck: supple. JVP 8.  Carotids 2+ bilat; no bruits. No lymphadenopathy or thryomegaly appreciated. Cor: LVAD hum.  Lungs: Clear with decreased BS Abdomen: obese soft, nontender, non-distended. No hepatosplenomegaly. No bruits or masses. Good bowel sounds. Driveline site clean. Anchor in place.  Extremities: no cyanosis, clubbing, rash. Warm no edema  Neuro: alert & oriented x 3. No focal deficits. Moves all 4 without problem     Telemetry   NSR 70-80s Personally reviewed    Labs   Basic Metabolic Panel:  Recent Labs Lab 07/11/16 0032 07/12/16 0315 07/13/16 0319 07/14/16 0241 07/15/16 0457  NA 134* 135 133* 132* 136  K 4.3 4.6 4.4 3.8 3.8  CL 101 103 98* 97* 99*  CO2 28 28 29 28 31   GLUCOSE 217* 182* 236* 270* 267*  BUN 15 6 5* 8 7  CREATININE 0.65 0.64 0.85 0.95 0.86    CALCIUM 8.5* 8.7* 8.7* 8.3* 8.8*    Liver Function Tests: No results for input(s): AST, ALT, ALKPHOS, BILITOT, PROT, ALBUMIN in the last 168 hours. No results for input(s): LIPASE, AMYLASE in the last 168 hours. No results for input(s): AMMONIA in the last 168 hours.  CBC:  Recent Labs Lab 07/11/16 0032 07/12/16 0315 07/13/16 0319 07/14/16 0241 07/15/16 0457  WBC 6.3 5.7 5.9 6.8 6.0  HGB 8.4* 8.6* 8.9* 8.3* 8.8*  HCT 26.6* 27.9* 28.9* 26.0* 28.2*  MCV 91.4 92.1 91.7 90.6 91.9  PLT 142* 149* 153 143* 151    INR:  Recent Labs Lab 07/11/16 0032 07/12/16 0315 07/13/16 0319 07/14/16 0241 07/15/16 0458  INR 1.26 1.15 1.18 1.28 1.43    Other results:   Imaging   No results found.   Medications:     Scheduled Medications: . aspirin EC  81 mg Oral Daily  . atorvastatin  40 mg Oral q1800  . docusate sodium  100 mg Oral BID  . ezetimibe  10 mg Oral q1800  . ferrous fumarate-b12-vitamic C-folic acid  1 capsule Oral BID BM  . insulin aspart  0-5  Units Subcutaneous QHS  . insulin aspart  0-9 Units Subcutaneous TID WC  . insulin glargine  20 Units Subcutaneous Daily  . losartan  25 mg Oral BID  . pantoprazole  40 mg Oral BID  . sildenafil  20 mg Oral TID  . torsemide  40 mg Oral Daily  . warfarin  12.5 mg Oral ONCE-1800  . Warfarin - Pharmacist Dosing Inpatient   Does not apply q1800    Infusions: . sodium chloride    . sodium chloride    . 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   Assessment/Plan:    1. Acute blood loss anemia due to acute upper GI bleeding: Patient presented with symptomatic anemia and melena.  Increased fatigue, hemoglobin down to 7.  He has had "diarrhea" since Monday or Tuesday.  INR supratherapeutic at admission.  He had  been on ASA 162 mg daily and warfarin.  - He has had 3u PRBCs. - S/P 07/10/2016 EGD + Enteroscopy-->. 2 AVMs with APC Possible AVM bleeding.   - hgb stable today - On heparin/coumadin. On lower dose of asa 81 mg daily.   - Continue oral PPI.    - INR 1.43. Discussed with PharmD personally. Can stop heparin when INR > 1.8. Home when INR > 2.0  2. Chronic systolic CHF: Ischemic cardiomyopathy, St Jude ICD.  He has had trouble with RV failure and volume overload but had been improving on Revatio + torsemide 80 mg bid at home.  HVAD present. HVAD parameters reviewed and stable.    - Volume status stable. Continue torsemide 40 mg daily. (prior to admit he was on torsemide 80 mg twice a day) - If weight goes up can increase as needed . Weight 223 today - Continue Revatio.  - INR management as above - VAD parameters stable  3. ID:  -Increased drainage around driveline on admit. Resolved. Finished Keflex 7/13  4. Smoking: He has restarted.  Encouraged to quit.  5. Atrial fibrillation: Paroxysmal.  Off amio due to nausea. Maintaining NSR. On coumadin.  6.  CAD: Stable, continues on statin.  - no s/s ischemia.  7. Hypokalemia - K 3.8 will supp 8. Uncontrolled Diabetes:  -Diabetes Coordinator recommendations appreciated. Will increase lantus to 25 as CBGs still quite high  I reviewed the LVAD parameters from today, and compared the results to the patient's prior recorded data.  No programming changes were made.  The LVAD is functioning within specified parameters.  The patient performs LVAD self-test daily.  LVAD interrogation was negative for any significant power changes, alarms or PI events/speed drops.  LVAD equipment check completed and is in good working order.  Back-up equipment present.   LVAD education done on emergency procedures and precautions and reviewed exit site care.    Length of Stay: 8  Arvilla MeresBensimhon, Frankie Zito, MD 07/15/2016, 11:57 AM  VAD Team --- VAD ISSUES ONLY--- Pager  [phone] (7am - 7am) Advanced Heart Failure Team  Pager [phone] (M-F; 7a - 4p)  Please contact [facility] for night-coverage after hours (4p -7a ) and weekends on [email or web address]

## 2016-07-16 ENCOUNTER — Encounter (HOSPITAL_COMMUNITY): Payer: Self-pay

## 2016-07-16 ENCOUNTER — Inpatient Hospital Stay (HOSPITAL_COMMUNITY): Payer: BLUE CROSS/BLUE SHIELD

## 2016-07-16 DIAGNOSIS — I493 Ventricular premature depolarization: Secondary | ICD-10-CM

## 2016-07-16 LAB — GLUCOSE, CAPILLARY
GLUCOSE-CAPILLARY: 160 mg/dL — AB (ref 65–99)
GLUCOSE-CAPILLARY: 188 mg/dL — AB (ref 65–99)
GLUCOSE-CAPILLARY: 222 mg/dL — AB (ref 65–99)
Glucose-Capillary: 301 mg/dL — ABNORMAL HIGH (ref 65–99)

## 2016-07-16 LAB — CBC
HCT: 29.3 % — ABNORMAL LOW (ref 39.0–52.0)
HEMOGLOBIN: 9 g/dL — AB (ref 13.0–17.0)
MCH: 28.2 pg (ref 26.0–34.0)
MCHC: 30.7 g/dL (ref 30.0–36.0)
MCV: 91.8 fL (ref 78.0–100.0)
Platelets: 150 10*3/uL (ref 150–400)
RBC: 3.19 MIL/uL — AB (ref 4.22–5.81)
RDW: 17.9 % — ABNORMAL HIGH (ref 11.5–15.5)
WBC: 6.2 10*3/uL (ref 4.0–10.5)

## 2016-07-16 LAB — HEPARIN LEVEL (UNFRACTIONATED): Heparin Unfractionated: 0.4 IU/mL (ref 0.30–0.70)

## 2016-07-16 LAB — BASIC METABOLIC PANEL
ANION GAP: 6 (ref 5–15)
BUN: 7 mg/dL (ref 6–20)
CHLORIDE: 99 mmol/L — AB (ref 101–111)
CO2: 30 mmol/L (ref 22–32)
Calcium: 8.8 mg/dL — ABNORMAL LOW (ref 8.9–10.3)
Creatinine, Ser: 0.89 mg/dL (ref 0.61–1.24)
Glucose, Bld: 160 mg/dL — ABNORMAL HIGH (ref 65–99)
POTASSIUM: 3.8 mmol/L (ref 3.5–5.1)
SODIUM: 135 mmol/L (ref 135–145)

## 2016-07-16 LAB — PROTIME-INR
INR: 1.53
Prothrombin Time: 18.5 seconds — ABNORMAL HIGH (ref 11.4–15.2)

## 2016-07-16 LAB — LACTATE DEHYDROGENASE: LDH: 162 U/L (ref 98–192)

## 2016-07-16 MED ORDER — GABAPENTIN 100 MG PO CAPS
100.0000 mg | ORAL_CAPSULE | Freq: Three times a day (TID) | ORAL | Status: DC
  Administered 2016-07-16 – 2016-07-19 (×10): 100 mg via ORAL
  Filled 2016-07-16 (×10): qty 1

## 2016-07-16 MED ORDER — SODIUM CHLORIDE 0.9 % IV BOLUS (SEPSIS)
250.0000 mL | Freq: Once | INTRAVENOUS | Status: AC
  Administered 2016-07-16: 250 mL via INTRAVENOUS

## 2016-07-16 MED ORDER — IOPAMIDOL (ISOVUE-300) INJECTION 61%
INTRAVENOUS | Status: AC
  Administered 2016-07-16: 75 mL
  Filled 2016-07-16: qty 75

## 2016-07-16 MED ORDER — POTASSIUM CHLORIDE CRYS ER 20 MEQ PO TBCR
40.0000 meq | EXTENDED_RELEASE_TABLET | Freq: Once | ORAL | Status: AC
  Administered 2016-07-16: 40 meq via ORAL
  Filled 2016-07-16: qty 2

## 2016-07-16 MED ORDER — WARFARIN SODIUM 5 MG PO TABS
15.0000 mg | ORAL_TABLET | Freq: Once | ORAL | Status: AC
  Administered 2016-07-16: 15 mg via ORAL
  Filled 2016-07-16: qty 3

## 2016-07-16 MED ORDER — MAGNESIUM SULFATE 2 GM/50ML IV SOLN
2.0000 g | Freq: Once | INTRAVENOUS | Status: AC
  Administered 2016-07-16: 2 g via INTRAVENOUS
  Filled 2016-07-16: qty 50

## 2016-07-16 NOTE — Progress Notes (Signed)
Received page from pt stating he is having suction alarms now. I am heading in to assess pt and hvad equipment.   Carlton AdamSarah Herbert RN, BSN VAD Coordinator [phone]

## 2016-07-16 NOTE — Progress Notes (Signed)
VAD team paged, waveform dampened, pt having bigeminy.  He is currently sitting on edge of bed reading, states he "feels fine".  Hct adjusted, waveform back up at this time.  Will continue to monitor.

## 2016-07-16 NOTE — Progress Notes (Signed)
Patient ID: [name], male   DOB: 1955-10-01, 61 y.o.   MRN: [id number]   Advanced Heart Failure VAD Team Note  Subjective:    Admitted with GI bleed. Overall received 3UPRBCs. S/P EGD Enteroscopy--> 2 AVMs APC.   Remains on heparin drip. INR 1.53. Hgb stable 8.8-> 9.0. Heparin 0.40  Overnight had flattened waveforms which seemed to be positional. Flows were normal. This am again with flattened waveforms as well as PVCs and suction alarms. Received 250cc of IVF and now getting Mag. Feels fine. No melena. No dizziness. Did have some pocket pain last night. Weight stable. K 3.8  HVAD INTERROGATION:  Flow 5.4 liters/min, speed 3000 rpm, power 5.9 W.  Peak 6.1 Trough 4.5   + suction alarms. Suction on. Lavare on.   No change in parameters on lying down  On standing Peak 5.9/ Trough 5.3  Objective:    Vital Signs:   Temp:  [98.1 F (36.7 C)-98.4 F (36.9 C)] 98.4 F (36.9 C) (07/15 0400) Pulse Rate:  [76-82] 76 (07/15 0700) Resp:  [9-14] 9 (07/15 0749) BP: (84-136)/(42-74) 86/42 (07/15 0749) SpO2:  [97 %-100 %] 99 % (07/15 0700) Weight:  [101.1 kg (222 lb 14.4 oz)] 101.1 kg (222 lb 14.4 oz) (07/15 0422) Last BM Date: 07/14/16 Mean arterial Pressure 70s  Intake/Output:   Intake/Output Summary (Last 24 hours) at 07/16/16 0945 Last data filed at 07/16/16 0524  Gross per 24 hour  Intake              600 ml  Output             2400 ml  Net            -1800 ml     Physical Exam    General:  NAD.  HEENT: normal  Neck: supple. JVP 6-7  Carotids 2+ bilat; no bruits. No lymphadenopathy or thryomegaly appreciated. Cor: LVAD hum.  Lungs: Clear mildly decreased at bases Abdomen: soft, nontender, non-distended. No hepatosplenomegaly. No bruits or masses. Good bowel sounds. Driveline site clean. Anchor in place.  Extremities: no cyanosis, clubbing, rash. Warm no edema + venous stasis changes Neuro: alert & oriented x 3. No focal deficits. Moves all 4 without problem     Telemetry   NSR 70-80s with frequent multifocal PVCs Personally reviewed    Labs   Basic Metabolic Panel:  Recent Labs Lab 07/12/16 0315 07/13/16 0319 07/14/16 0241 07/15/16 0457 07/16/16 0354  NA 135 133* 132* 136 135  K 4.6 4.4 3.8 3.8 3.8  CL 103 98* 97* 99* 99*  CO2 28 29 28 31 30   GLUCOSE 182* 236* 270* 267* 160*  BUN 6 5* 8 7 7   CREATININE 0.64 0.85 0.95 0.86 0.89  CALCIUM 8.7* 8.7* 8.3* 8.8* 8.8*    Liver Function Tests: No results for input(s): AST, ALT, ALKPHOS, BILITOT, PROT, ALBUMIN in the last 168 hours. No results for input(s): LIPASE, AMYLASE in the last 168 hours. No results for input(s): AMMONIA in the last 168 hours.  CBC:  Recent Labs Lab 07/12/16 0315 07/13/16 0319 07/14/16 0241 07/15/16 0457 07/16/16 0354  WBC 5.7 5.9 6.8 6.0 6.2  HGB 8.6* 8.9* 8.3* 8.8* 9.0*  HCT 27.9* 28.9* 26.0* 28.2* 29.3*  MCV 92.1 91.7 90.6 91.9 91.8  PLT 149* 153 143* 151 150    INR:  Recent Labs Lab 07/12/16 0315 07/13/16 0319 07/14/16 0241 07/15/16 0458 07/16/16 0354  INR 1.15 1.18 1.28 1.43 1.53    Other results:  Imaging   No results found.   Medications:     Scheduled Medications: . aspirin EC  81 mg Oral Daily  . atorvastatin  40 mg Oral q1800  . docusate sodium  100 mg Oral BID  . ezetimibe  10 mg Oral q1800  . ferrous fumarate-b12-vitamic C-folic acid  1 capsule Oral BID BM  . insulin aspart  0-5 Units Subcutaneous QHS  . insulin aspart  0-9 Units Subcutaneous TID WC  . insulin glargine  20 Units Subcutaneous Daily  . losartan  25 mg Oral BID  . pantoprazole  40 mg Oral BID  . potassium chloride  20 mEq Oral Daily  . sildenafil  20 mg Oral TID  . torsemide  40 mg Oral Daily  . warfarin  15 mg Oral ONCE-1800  . Warfarin - Pharmacist Dosing Inpatient   Does not apply q1800    Infusions: . sodium chloride    . sodium chloride    . heparin 1,600 Units/hr (07/16/16 0554)  . 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   Assessment/Plan:    1. Acute blood loss anemia due to acute upper GI bleeding: Patient presented with symptomatic anemia and melena.  Increased fatigue, hemoglobin down to 7.  INR supratherapeutic at admission.  He had been on ASA 162 mg daily and warfarin.  - He has had 3u PRBCs. - S/P 07/10/2016 EGD + Enteroscopy-->. 2 AVMs with APC Possible AVM bleeding.   - hgb mildly increased today at 9.0 - On heparin/coumadin. On lower dose of asa 81 mg daily.   - Continue oral PPI.    - INR 1.53. Discussed with PharmD personally. Can stop heparin when INR > 1.8. Home when INR > 2.0  2. 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  He has had trouble with RV failure and volume overload but had been improving on Revatio + torsemide 80 mg bid at home.  HVAD present. HVAD parameters reviewed and stable.    - Over last 24 hours has had flattening of waveforms with PVCs and suction alarms. We did multiple maneuvers with him this am and assessed VAD parameters. I suspect the problem is due to decreased preload in setting of volume depletion and RV dysfunction. Discussed with VAD coordinators at bedside.  - Will give 500 cc NS. Drop speed 3000->2840 Hold torsemide for now - Will do chest CT to make sure pump position and outflow graft ok.  - Can consider repeat ramp echo prior to d/c - Continue Revatio.  - INR management as above. Discussed with PharmD personally  3. ID:  -Increased drainage around driveline on admit. Resolved. Finished Keflex 7/13  4. Smoking: He has restarted.  Encouraged to quit.  5. Atrial fibrillation: Paroxysmal.  Off amio due to nausea. Maintaining NSR on tele today. On coumadin.  6.  CAD: Stable, continues on statin.   - no s/s ischemia 7. Hypokalemia - K 3.8 will supp 8. Uncontrolled Diabetes:  -Diabetes Coordinator recommendations appreciated. Yesterday I increases lantus to 25 as CBGs improved 9. PVCs - Likely due to suction events.  - Given IVF and Mg 2 grams.Marland Kitchen Speed decreased.   Length of Stay: 9  Arvilla Meres, MD 07/16/2016,  9:45 AM  VAD Team --- VAD ISSUES ONLY--- Pager [phone] (7am - 7am) Advanced Heart Failure Team  Pager [phone] (M-F; 7a - 4p)  Please contact [facility] for night-coverage after hours (4p -7a ) and weekends on [email or web address]

## 2016-07-16 NOTE — Discharge Instructions (Signed)
Information on my medicine - Coumadin   (Warfarin)  This medication education was reviewed with me or my healthcare representative as part of my discharge preparation.   Why was Coumadin prescribed for you? Coumadin was prescribed for you because you have a blood clot or a medical condition that can cause an increased risk of forming blood clots. Blood clots can cause serious health problems by blocking the flow of blood to the heart, lung, or brain. Coumadin can prevent harmful blood clots from forming. As a reminder your indication for Coumadin is:   Blood Clot Prevention After Heart Pump Surgery  What test will check on my response to Coumadin? While on Coumadin (warfarin) you will need to have an INR test regularly to ensure that your dose is keeping you in the desired range. The INR (international normalized ratio) number is calculated from the result of the laboratory test called prothrombin time (PT).  If an INR APPOINTMENT HAS NOT ALREADY BEEN MADE FOR YOU please schedule an appointment to have this lab work done by your health care provider within 7 days. Your INR goal is a number between:  2 to 2.5.  This was decreased because of your belly bleed.   What  do you need to  know  About  COUMADIN? Take Coumadin (warfarin) exactly as prescribed by your healthcare provider about the same time each day.  DO NOT stop taking without talking to the doctor who prescribed the medication.  Stopping without other blood clot prevention medication to take the place of Coumadin may increase your risk of developing a new clot or stroke.  Get refills before you run out.  What do you do if you miss a dose? If you miss a dose, take it as soon as you remember on the same day then continue your regularly scheduled regimen the next day.  Do not take two doses of Coumadin at the same time.  Important Safety Information A possible side effect of Coumadin (Warfarin) is an increased risk of bleeding. You should  call your healthcare provider right away if you experience any of the following: ? Bleeding from an injury or your nose that does not stop. ? Unusual colored urine (red or dark brown) or unusual colored stools (red or black). ? Unusual bruising for unknown reasons. ? A serious fall or if you hit your head (even if there is no bleeding).  Some foods or medicines interact with Coumadin (warfarin) and might alter your response to warfarin. To help avoid this: ? Eat a balanced diet, maintaining a consistent amount of Vitamin K. ? Notify your provider about major diet changes you plan to make. ? Avoid alcohol or limit your intake to 1 drink for women and 2 drinks for men per day. (1 drink is 5 oz. wine, 12 oz. beer, or 1.5 oz. liquor.)  Make sure that ANY health care provider who prescribes medication for you knows that you are taking Coumadin (warfarin).  Also make sure the healthcare provider who is monitoring your Coumadin knows when you have started a new medication including herbals and non-prescription products.  Coumadin (Warfarin)  Major Drug Interactions  Increased Warfarin Effect Decreased Warfarin Effect  Alcohol (large quantities) Antibiotics (esp. Septra/Bactrim, Flagyl, Cipro) Amiodarone (Cordarone) Aspirin (ASA) Cimetidine (Tagamet) Megestrol (Megace) NSAIDs (ibuprofen, naproxen, etc.) Piroxicam (Feldene) Propafenone (Rythmol SR) Propranolol (Inderal) Isoniazid (INH) Posaconazole (Noxafil) Barbiturates (Phenobarbital) Carbamazepine (Tegretol) Chlordiazepoxide (Librium) Cholestyramine (Questran) Griseofulvin Oral Contraceptives Rifampin Sucralfate (Carafate) Vitamin K   Coumadin (Warfarin)  Major Herbal Interactions  Increased Warfarin Effect Decreased Warfarin Effect  Garlic Ginseng Ginkgo biloba Coenzyme Q10 Green tea St. Johns wort    Coumadin (Warfarin) FOOD Interactions  Eat a consistent number of servings per week of foods HIGH in Vitamin K (1 serving  =  cup)  Collards (cooked, or boiled & drained) Kale (cooked, or boiled & drained) Mustard greens (cooked, or boiled & drained) Parsley *serving size only =  cup Spinach (cooked, or boiled & drained) Swiss chard (cooked, or boiled & drained) Turnip greens (cooked, or boiled & drained)  Eat a consistent number of servings per week of foods MEDIUM-HIGH in Vitamin K (1 serving = 1 cup)  Asparagus (cooked, or boiled & drained) Broccoli (cooked, boiled & drained, or raw & chopped) Brussel sprouts (cooked, or boiled & drained) *serving size only =  cup Lettuce, raw (green leaf, endive, romaine) Spinach, raw Turnip greens, raw & chopped   These websites have more information on Coumadin (warfarin):  http://www.king-russell.com/www.coumadin.com; https://www.hines.net/www.ahrq.gov/consumer/coumadin.htm;

## 2016-07-16 NOTE — Progress Notes (Signed)
Received page from bedside nurse stating that pts waveform is dampened and he is having frequent PVCs. Nurse was instructed to give 250cc bolus and 2g of Mag per Dr. Gala RomneyBensimhon.   Carlton AdamSarah Tryniti Laatsch RN, BSN VAD Coordinator [phone]

## 2016-07-16 NOTE — Progress Notes (Signed)
ANTICOAGULATION CONSULT NOTE - Follow Up Consult  Pharmacy Consult for Heparin and Coumadin Indication: HVAD  Allergies  Allergen Reactions  . Metformin And Related Nausea And Vomiting    *Only the extended release* (does this)  . Niacin And Related Other (See Comments)    REACTION IS SIDE EFFECT "SEVERE" FLUSHING    Patient Measurements: Height: 6' (182.9 cm) Weight: 222 lb 14.4 oz (101.1 kg) IBW/kg (Calculated) : 77.6 Heparin Dosing Weight: 98kg  Vital Signs: Temp: 98.4 F (36.9 C) (07/15 0400) Temp Source: Oral (07/15 0400) BP: 89/63 (07/15 0400) Pulse Rate: 76 (07/15 0400)  Labs:  Recent Labs  07/14/16 0241 07/15/16 0457 07/15/16 0458 07/16/16 0354  HGB 8.3* 8.8*  --  9.0*  HCT 26.0* 28.2*  --  29.3*  PLT 143* 151  --  150  LABPROT 16.1*  --  17.6* 18.5*  INR 1.28  --  1.43 1.53  HEPARINUNFRC 0.26*  --  0.36 0.40  CREATININE 0.95 0.86  --  0.89    Estimated Creatinine Clearance: 107.3 mL/min (by C-G formula based on SCr of 0.89 mg/dL).   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 Admitted with melena and symptomatic anemia. INR 3.29 on admit and coumadin held.   S/p EGD on 7/9 with ablations of gastric AVMs x2. Heparin restarted post EGD. Heparin level at goal at 0.4. H/H remains stable post PRBC. LDH stable. ASA 81mg  restarted 7/12 Coumadin restarted 7/11. INR 1.53 today. - slow to increase.  Home dose: 10mg  daily except 12.5mg  Mon/Wed  Goal of Therapy:  INR 2-2.5 Monitor platelets by anticoagulation protocol: Yes   Plan:  1) Continue heparin at 1600 units/hr 2) slight bump warfarin 15mg  tonight 3) Daily heparin level, CBC, INR  Leota SauersLisa Lawrnce Reyez Pharm.D. CPP, BCPS Clinical Pharmacist [phone] 07/16/2016 7:37 AM

## 2016-07-16 NOTE — Progress Notes (Signed)
6 Days Post-Op Procedure(s) (LRB): ENTEROSCOPY (N/A) HOT HEMOSTASIS (ARGON PLASMA COAGULATION/BICAP) (N/A) Subjective: Events noted Pump parameters better at lower speed CT chest personally reviewed- pump orientation and  outflow graft in good position, small R pleural effusion, sternal incision healing without malunion, wires intact  Objective: Vital signs in last 24 hours: Temp:  [98.1 F (36.7 C)-98.8 F (37.1 C)] 98.1 F (36.7 C) (07/15 1345) Pulse Rate:  [76-79] 77 (07/15 1345) Cardiac Rhythm: Normal sinus rhythm;Bundle branch block (07/15 0701) Resp:  [9-19] 14 (07/15 1345) BP: (84-136)/(42-74) 111/68 (07/15 1158) SpO2:  [97 %-100 %] 97 % (07/15 1345) Weight:  [222 lb 14.4 oz (101.1 kg)] 222 lb 14.4 oz (101.1 kg) (07/15 0422)  Hemodynamic parameters for last 24 hours:  stable  Intake/Output from previous day: 07/14 0701 - 07/15 0700 In: 600 [P.O.:600] Out: 2400 [Urine:2400] Intake/Output this shift: Total I/O In: 480 [P.O.:480] Out: 751 [Urine:750; Stool:1]    Lab Results:  Recent Labs  07/15/16 0457 07/16/16 0354  WBC 6.0 6.2  HGB 8.8* 9.0*  HCT 28.2* 29.3*  PLT 151 150   BMET:  Recent Labs  07/15/16 0457 07/16/16 0354  NA 136 135  K 3.8 3.8  CL 99* 99*  CO2 31 30  GLUCOSE 267* 160*  BUN 7 7  CREATININE 0.86 0.89  CALCIUM 8.8* 8.8*    PT/INR:  Recent Labs  07/16/16 0354  LABPROT 18.5*  INR 1.53   ABG    Component Value Date/Time   PHART 7.485 (H) 05/31/2016 1614   HCO3 28.7 (H) 05/31/2016 1614   TCO2 28 06/04/2016 1613   O2SAT 62.8 06/05/2016 0655   CBG (last 3)   Recent Labs  07/16/16 0635 07/16/16 1111 07/16/16 1603  GLUCAP 160* 188* 222*    Assessment/Plan: S/P Procedure(s) (LRB): ENTEROSCOPY (N/A) HOT HEMOSTASIS (ARGON PLASMA COAGULATION/BICAP) (N/A) continue with coumadin dosing to reach INR goal 2.0   LOS: 9 days    [name] 07/16/2016

## 2016-07-16 NOTE — Progress Notes (Signed)
Upon arrival to the unit pt was sitting on the side of the bed. His waveform has improved slightly with 250 bolus. Had pt sit and stand multiple times to watch the peak and trough. Waveform dampened while standing and peak/trough was 5.9/5.3.  Speed was dropped to 2840. This increased pulsatility greatly, had pt stand again and he maintained a peak and trough of 6.1/4.5. Going to give another 250cc bolus per Dr. Gala RomneyBensimhon. We will also start 300mg  daily of Neurontin for pts pump pain he continues to complain about. After 10 minutes of new speed pt has 5.5 flow, 5.2 power and peak/trough of 7/4.4 we will leave the pt at 2840. Back up controller speed changed along with hematocrit and alarm settings. Please call with any questions.

## 2016-07-17 DIAGNOSIS — K922 Gastrointestinal hemorrhage, unspecified: Secondary | ICD-10-CM

## 2016-07-17 LAB — BASIC METABOLIC PANEL
Anion gap: 6 (ref 5–15)
BUN: 10 mg/dL (ref 6–20)
CALCIUM: 8.6 mg/dL — AB (ref 8.9–10.3)
CHLORIDE: 99 mmol/L — AB (ref 101–111)
CO2: 29 mmol/L (ref 22–32)
CREATININE: 0.89 mg/dL (ref 0.61–1.24)
GFR calc non Af Amer: >60 mL/min (ref >60)
Glucose, Bld: 228 mg/dL — ABNORMAL HIGH (ref 65–99)
Potassium: 3.8 mmol/L (ref 3.5–5.1)
SODIUM: 134 mmol/L — AB (ref 135–145)

## 2016-07-17 LAB — GLUCOSE, CAPILLARY
GLUCOSE-CAPILLARY: 249 mg/dL — AB (ref 65–99)
GLUCOSE-CAPILLARY: 216 mg/dL — AB (ref 65–99)
Glucose-Capillary: 165 mg/dL — ABNORMAL HIGH (ref 65–99)
Glucose-Capillary: 286 mg/dL — ABNORMAL HIGH (ref 65–99)

## 2016-07-17 LAB — LACTATE DEHYDROGENASE: LDH: 150 U/L (ref 98–192)

## 2016-07-17 LAB — PROTIME-INR
INR: 1.51
PROTHROMBIN TIME: 18.4 s — AB (ref 11.4–15.2)

## 2016-07-17 LAB — CBC
HCT: 27.9 % — ABNORMAL LOW (ref 39.0–52.0)
HEMOGLOBIN: 8.7 g/dL — AB (ref 13.0–17.0)
MCH: 28.6 pg (ref 26.0–34.0)
MCHC: 31.2 g/dL (ref 30.0–36.0)
MCV: 91.8 fL (ref 78.0–100.0)
Platelets: 131 10*3/uL — ABNORMAL LOW (ref 150–400)
RBC: 3.04 MIL/uL — ABNORMAL LOW (ref 4.22–5.81)
RDW: 18 % — AB (ref 11.5–15.5)
WBC: 5.6 10*3/uL (ref 4.0–10.5)

## 2016-07-17 LAB — HEPARIN LEVEL (UNFRACTIONATED): HEPARIN UNFRACTIONATED: 0.45 [IU]/mL (ref 0.30–0.70)

## 2016-07-17 MED ORDER — WARFARIN SODIUM 5 MG PO TABS
17.5000 mg | ORAL_TABLET | Freq: Once | ORAL | Status: AC
  Administered 2016-07-17: 17.5 mg via ORAL
  Filled 2016-07-17: qty 4

## 2016-07-17 NOTE — Care Management (Signed)
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   Expected Discharge Date:                         Expected Discharge Plan:   Home/self care  In-House Referral:     Discharge planning Services   In progress, will continue to follow  Post Acute Care Choice:    Choice offered to:     DME Arranged:    DME Agency:     HH Arranged:    HH Agency:     Status of Service:     If discussed at Long Length of Stay Meetings, dates discussed:   07-18-16  Discharge Disposition:   Additional Comments: 07-17-16 1502 Tomi BambergerBrenda Graves-Bigelow, RN,BSN [phone] Pt's INR 1.51- will need therapeutic INR before d/c. Continues on IV Heparin Gtt. Pt's from home with father and [facility]. Pt ambulating in hall without any difficulty at time of conversation. No further needs from CM at this time.    07/10/16- S/W AMBER C. @ [employer]  [place] : CONTACT FOR D/C PLANNING NEEDS PHONE #  [phone]  [phone]-- per Verlee Monteora- CMS/CM dept.

## 2016-07-17 NOTE — Progress Notes (Signed)
Inpatient Diabetes Program Recommendations  AACE/ADA: New Consensus Statement on Inpatient Glycemic Control (2015)  Target Ranges:  Prepandial:   less than 140 mg/dL      Peak postprandial:   less than 180 mg/dL (1-2 hours)      Critically ill patients:  140 - 180 mg/dL  Results for [name], [name] (MRN [id number]) as of 07/17/2016 14:38  Ref. Range 07/16/2016 06:35 07/16/2016 11:11 07/16/2016 16:03 07/16/2016 20:55 07/17/2016 06:08  Glucose-Capillary Latest Ref Range: 65 - 99 mg/dL 409160 (H) 811188 (H) 914222 (H) 301 (H) 165 (H)  Results for [name], [name] (MRN [id number]) as of 07/17/2016 14:38  Ref. Range 07/15/2016 06:12 07/15/2016 11:14 07/15/2016 15:41 07/16/2016 00:01  Glucose-Capillary Latest Ref Range: 65 - 99 mg/dL 086232 (H) 578255 (H) 469255 (H) 164 (H)    Review of Glycemic Control  Diabetes history: DM2 Outpatient Diabetes medications: 70/30 30 units with breakfast, 70/30 15-20 units with lunch, 70/30 15-20 units with supper Current orders for Inpatient glycemic control: Lantus 20 units daily, Novolog 0-9 units TID with meals, Novolog 0-5 units QHS  Inpatient Diabetes Program Recommendations: Insulin - Meal Coverage: Please consider ordering Novolog 4 units TID with meals for meal coverage if patient eats at least 50% of meals.  Thanks, Orlando PennerMarie Montrelle Eddings, RN, MSN, CDE Diabetes Coordinator Inpatient Diabetes Program [phone] (Team Pager from 8am to 5pm)

## 2016-07-17 NOTE — Progress Notes (Signed)
ANTICOAGULATION CONSULT NOTE - Follow Up Consult  Pharmacy Consult for Heparin and Coumadin Indication: HVAD  Allergies  Allergen Reactions  . Metformin And Related Nausea And Vomiting    *Only the extended release* (does this)  . Niacin And Related Other (See Comments)    REACTION IS SIDE EFFECT "SEVERE" FLUSHING    Patient Measurements: Height: 6' (182.9 cm) Weight: 228 lb 8 oz (103.6 kg) IBW/kg (Calculated) : 77.6 Heparin Dosing Weight: 98kg  Vital Signs: Temp: 98.2 F (36.8 C) (07/16 0831) Temp Source: Oral (07/16 0831) BP: 87/77 (07/16 0400) Pulse Rate: 72 (07/16 0831)  Labs:  Recent Labs  07/15/16 0457 07/15/16 0458 07/16/16 0354 07/17/16 0235  HGB 8.8*  --  9.0* 8.7*  HCT 28.2*  --  29.3* 27.9*  PLT 151  --  150 131*  LABPROT  --  17.6* 18.5* 18.4*  INR  --  1.43 1.53 1.51  HEPARINUNFRC  --  0.36 0.40 0.45  CREATININE 0.86  --  0.89 0.89    Estimated Creatinine Clearance: 108.5 mL/min (by C-G formula based on SCr of 0.89 mg/dL).   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 Admitted with melena and symptomatic anemia. INR 3.29 on admit and coumadin held.   S/p EGD on 7/9 with ablations of gastric AVMs x2. Heparin restarted post EGD. Heparin level at goal at 0.45. H/H remains stable post PRBC. LDH stable. ASA 81mg  restarted 7/12 Coumadin restarted 7/11. INR 1.51 today. - slow to increase.  Home dose: 10mg  daily except 12.5mg  Mon/Wed  Goal of Therapy:  INR 2-2.5 Monitor platelets by anticoagulation protocol: Yes   Plan:  1) Continue heparin at 1600 units/hr 2) Increase coumadin to 17.5mg  tonight 3) Daily heparin level, CBC, INR  Louie CasaJennifer Maylea Soria, PharmD, BCPS 07/17/2016 10:08 AM

## 2016-07-17 NOTE — Progress Notes (Signed)
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   Vital signs: HR: 84 Doppler Pressure:70 Automatic BP: 87/77 (83) O2 Sat: 100 Room Air Wt: 102.3> 99.7kg >101.2>101.1>103.6kg   LVAD interrogation reveals:  Speed: 2840 Flow: 5.3 Power: 5 Peak/Trough: 7.3/4.1 Alarm Limits: 3.5/8 Events: none overnight HCT: 28- adjusted personally today  Suction alarm: on  Lavare: on   Drive Line: Pt needs daily dressing changes with silver strip.   Blood Products: 2 units PRBC-07/07/16 1 unit PRBC-07/08/16 1 unit PRBC-07/09/16  Labs:  LDH trend:156>155>159>152>150  INR trend: 2.82> 2.09 > 1.57 > 1.48>1.26>1.15>1.18>1.43>1.53>1.51  Hgb: 8.6>8.9>8.8>9.0>8.7  Gtts: Heparin 1600 u/hr  Anticoagulation Plan: -INR Goal: 2-2.5 -ASA Dose: 81mg   Adverse Events on VAD: GI bleed-07/07/16   Plan/Recommendations:   1. Daily dressing changes.  2. Continue Heparin bridge.  3. Home when INR>2    Marcellus ScottLesley Wilson RN, VAD Coordinator 24/7 pager [phone]

## 2016-07-17 NOTE — Progress Notes (Addendum)
Patient ID: [name], male   DOB: [birth year], 61 y.o.   MRN: [id number]   Advanced Heart Failure VAD Team Note  Subjective:    Admitted with GI bleed. Overall received 3UPRBCs. S/P EGD Enteroscopy--> 2 AVMs APC.   Remains on Heparin drip. INR 1.5. Hgb 8.7. 1 BM yesterday greenish color.   Yesterday flattened waveforms. Given 500 cc NS bolus. Speed cut back to 2840. CT pump position and outflow graft ok.   Overall feeling fine. Frustrated and wants to go home.   HVAD INTERROGATION:  Flow 5.2 liters/min, speed 2849 rpm, power 5  Peak 6.8 Trough 4.1. 1 suction alarm. Suction on. Lavare on.    Objective:    Vital Signs:   Temp:  [97.9 F (36.6 C)-98.8 F (37.1 C)] 97.9 F (36.6 C) (07/16 0400) Pulse Rate:  [71-77] 71 (07/16 0400) Resp:  [13-19] 18 (07/16 0022) BP: (84-111)/(63-77) 87/77 (07/16 0400) SpO2:  [94 %-100 %] 94 % (07/16 0400) Weight:  [228 lb 8 oz (103.6 kg)] 228 lb 8 oz (103.6 kg) (07/16 0610) Last BM Date: 07/16/16 Mean arterial Pressure 70-80s  Intake/Output:   Intake/Output Summary (Last 24 hours) at 07/17/16 0802 Last data filed at 07/17/16 0610  Gross per 24 hour  Intake              480 ml  Output             2201 ml  Net            -1721 ml     Physical Exam   Physical Exam: GENERAL: Well appearing, sittign on the side of the bed. NAD.  HEENT: normal  NECK: Supple, JVP 7-8.  2+ bilaterally, no bruits.  No lymphadenopathy or thyromegaly appreciated.   CARDIAC:  Mechanical heart sounds with LVAD hum present.  LUNGS:  Clear to auscultation bilaterally.  ABDOMEN:  Soft, round, nontender, positive bowel sounds x4.     LVAD exit site: well-healed and incorporated.  Dressing dry and intact.  No erythema or drainage.  Stabilization device present and accurately applied.  Driveline dressing is being changed daily per sterile technique. EXTREMITIES:  Warm and dry, no cyanosis, clubbing, rash or edema . Chronic lower extremity hyperpigmetation. NEUROLOGIC:   Alert and oriented x 4.  Gait steady.  No aphasia.  No dysarthria.  Affect pleasant.       Telemetry   NSR with PVCs 7-80s. Personally reviewed.     Labs   Basic Metabolic Panel:  Recent Labs Lab 07/13/16 0319 07/14/16 0241 07/15/16 0457 07/16/16 0354 07/17/16 0235  NA 133* 132* 136 135 134*  K 4.4 3.8 3.8 3.8 3.8  CL 98* 97* 99* 99* 99*  CO2 29 28 31 30 29   GLUCOSE 236* 270* 267* 160* 228*  BUN 5* 8 7 7 10   CREATININE 0.85 0.95 0.86 0.89 0.89  CALCIUM 8.7* 8.3* 8.8* 8.8* 8.6*    Liver Function Tests: No results for input(s): AST, ALT, ALKPHOS, BILITOT, PROT, ALBUMIN in the last 168 hours. No results for input(s): LIPASE, AMYLASE in the last 168 hours. No results for input(s): AMMONIA in the last 168 hours.  CBC:  Recent Labs Lab 07/13/16 0319 07/14/16 0241 07/15/16 0457 07/16/16 0354 07/17/16 0235  WBC 5.9 6.8 6.0 6.2 5.6  HGB 8.9* 8.3* 8.8* 9.0* 8.7*  HCT 28.9* 26.0* 28.2* 29.3* 27.9*  MCV 91.7 90.6 91.9 91.8 91.8  PLT 153 143* 151 150 131*    INR:  Recent Labs Lab  07/13/16 0319 07/14/16 0241 07/15/16 0458 07/16/16 0354 07/17/16 0235  INR 1.18 1.28 1.43 1.53 1.51    Other results:   Imaging   Ct Chest W Contrast  Result Date: 07/16/2016 CLINICAL DATA:  61 year old male with history of left ventricular assist device. Evaluate pump position and patency of outflow graft. EXAM: CT CHEST WITH CONTRAST TECHNIQUE: Multidetector CT imaging of the chest was performed during intravenous contrast administration. CONTRAST:  75mL ISOVUE-300 IOPAMIDOL (ISOVUE-300) INJECTION 61% COMPARISON:  Chest CT 05/11/2016. FINDINGS: LVAD: Left ventricular assist device in place in the apex of the left ventricle with the inflow cannula slightly directed toward the interventricular septum, but otherwise with the cannula well aligned with the long axis of the left ventricle. The outflow cannula is partially obscured by extensive beam hardening artifact. This is most  evident immediately adjacent to the device, and as the cannula extends toward the midline. The mid to distal portion of the outflow cannula is well visualized and is widely patent without evidence of hemodynamically significant stenosis all the way to the level of the anastomosis in the distal ascending thoracic aorta. Drive line is incompletely visualized, but adjacent to the visualized portions of the drive line there are no unexpected fluid collections. No unexpected fluid collection adjacent to the left ventricular assist device or surrounding the left ventricular apex. Cardiovascular: Heart size is normal. Small amount of pericardial fluid and/or thickening, unlikely to be of much hemodynamic significance. No pericardial calcification. Extensive dystrophic calcifications throughout the apex of the left ventricle, presumably related to prior low distal LAD territory myocardial infarction. There is aortic atherosclerosis, as well as atherosclerosis of the great vessels of the mediastinum and the coronary arteries, including calcified atherosclerotic plaque in the left anterior descending, left circumflex and right coronary arteries. Left-sided pacemaker/ AICD in place with lead tip terminating in the right ventricular apex. Mediastinum/Nodes: Several borderline enlarged and mildly enlarged mediastinal lymph nodes are noted measuring up to 12 mm in short axis in the subcarinal nodal station. Esophagus is unremarkable in appearance. No axillary lymphadenopathy. Lungs/Pleura: Small right and trace left pleural effusions lie dependently. Some associated passive subsegmental atelectasis in the dependent portions of the lungs bilaterally. No acute consolidative airspace disease. No pleural effusions. A few tiny calcified granulomas are noted throughout the lung bases. No larger more suspicious appearing pulmonary nodules or masses are noted. Upper Abdomen: Aortic atherosclerosis. Musculoskeletal: There are no  aggressive appearing lytic or blastic lesions noted in the visualized portions of the skeleton. Median sternotomy wires. Healing median sternotomy wound. IMPRESSION: 1. Expected postoperative appearance of new Heartmate 3 LVAD, as above. Although the proximal third of the outflow cannula is largely obscured by beam hardening artifact from the patient's device, the distal 2/3 of the cannula is widely patent without evidence of hemodynamically significant stenosis. 2. Aortic atherosclerosis, in addition to 3 vessel coronary artery disease, with extensive dystrophic calcification throughout the left ventricular apex, presumably related to prior distal LAD territory myocardial infarction(s). 3. Small right and trace left pleural effusions lying dependently with some associated passive subsegmental atelectasis in the item portions of the lungs bilaterally. 4. Several borderline enlarged and mildly enlarged mediastinal lymph nodes are nonspecific and likely reactive in the setting of recent surgery. 5. Additional incidental findings, as above. Aortic Atherosclerosis (ICD10-I70.0). Electronically Signed   By: Trudie Reed M.D.   On: 07/16/2016 16:17     Medications:     Scheduled Medications: . aspirin EC  81 mg Oral Daily  .  atorvastatin  40 mg Oral q1800  . docusate sodium  100 mg Oral BID  . ezetimibe  10 mg Oral q1800  . ferrous fumarate-b12-vitamic C-folic acid  1 capsule Oral BID BM  . gabapentin  100 mg Oral TID  . insulin aspart  0-5 Units Subcutaneous QHS  . insulin aspart  0-9 Units Subcutaneous TID WC  . insulin glargine  20 Units Subcutaneous Daily  . losartan  25 mg Oral BID  . pantoprazole  40 mg Oral BID  . potassium chloride  20 mEq Oral Daily  . sildenafil  20 mg Oral TID  . Warfarin - Pharmacist Dosing Inpatient   Does not apply q1800    Infusions: . sodium chloride    . sodium chloride    . heparin 1,600 Units/hr (07/16/16 2144)    PRN Medications: acetaminophen,  ondansetron (ZOFRAN) IV   Patient Profile   [name] is a 61 year old with history of chronic systolic heart failure, recent HVAD implant on 05/30/2016, pulmonary HTN, and PAD.   Assessment/Plan:    1. Acute blood loss anemia due to acute upper GI bleeding: Patient presented with symptomatic anemia and melena.  Increased fatigue, hemoglobin down to 7.  INR supratherapeutic at admission.  He had been on ASA 162 mg daily and warfarin. - He has had 3u PRBCs. - S/P 07/10/2016 EGD + Enteroscopy--> 2 AVMs with APC, suspect AVM bleeding.  He will need octreotide injections after discharge.  - Hgb stable 8.7.  - On heparin/coumadin. On lower dose of asa 81 mg daily.   - Continue oral PPI.    - INR 1.51. Discussed with PharmD personally. Can stop heparin when INR > 1.8. Home when INR > 2.0  2. Chronic systolic CHF with HVAD in place: Ischemic cardiomyopathy, [employer] ICD.  He has had trouble with RV failure and volume overload but had been improving on Revatio + torsemide 80 mg bid at home.  HVAD present. HVAD parameters reviewed and stable.    -Yesterday he had flattened waveforms. Seems to be preload dependent/RV failure and improved after 500 cc NS bolus.   - Continue reduced speed 2840.   Hold torsemide for now -- Can consider repeat ramp echo prior to d/c - Continue Revatio.  - IN management as above. Discussed with PharmD personally  3. ID: Increased drainage around driveline on admit. Resolved. Finished Keflex 7/13  4. Smoking: He has restarted.  Encouraged to quit.   5. Atrial fibrillation: Paroxysmal.  Off amio due to nausea. In NSR. On coumadin.   6.  CAD: Stable, continues on statin.  - no s/s ischemia  7. Hypokalemia K 3.8   8. Uncontrolled Diabetes:  -Diabetes Coordinator recommendations appreciated.   9. PVCs - Likely due to suction events.  Home later this week when INR >2    Length of Stay: 10  Tonye BecketAmy Clegg, NP 07/17/2016, 8:02 AM  VAD Team --- VAD ISSUES  ONLY--- Pager [phone] (7am - 7am) Advanced Heart Failure Team  Pager [phone] (M-F; 7a - 4p)  Please contact [facility] for night-coverage after hours (4p -7a ) and weekends on [email or web address]  Patient seen with NP, agree with the above note.  He is doing ok today.  Volume status looks good on exam.  We have held torsemide for now with flattened flow waveform yesterday, looks better today.  He will need torsemide going home, likely 40 mg daily.  Will decide over the next couple of days.   No  further overt GI bleeding.  Hemoglobin stable.  Had BM this morning, says it was green.  Continue heparin gtt until INR 1.8 and keep in hospital until INR > 2.  He is on lower dose of ASA, 81 daily.  He will need octreotide injections after discharge.   Marca Ancona 07/17/2016 9:21 AM

## 2016-07-17 NOTE — Progress Notes (Signed)
Personally performed  drive line exit wound care. Existing VAD dressing removed and site care performed using sterile technique. Drive line exit site cleaned with Chlora prep applicators x 2, allowed to dry, and gauze dressing with aquacell ag silver strip re-applied. Exit site healed and incorporated, the velour is fully implanted at exit site. No redness, tenderness,  foul odor or rash noted. Scant amount of tan colored drainage noted at site. Drive line anchor re-applied. Pt denies fever or chills. Will continue with daily dressing changes at this time.    Marcellus ScottLesley Wilson RN, VAD Coordinator 24/7 pager [phone]

## 2016-07-17 NOTE — Progress Notes (Signed)
1355 Pt has walked twice already today. Stated he tries to walk three times a day. Can walk independently and prefers to do so. Will sign off. Luetta NuttingCharlene Raynah Gomes RN BSN 07/17/2016 1:54 PM

## 2016-07-18 LAB — CBC
HCT: 26.6 % — ABNORMAL LOW (ref 39.0–52.0)
HEMOGLOBIN: 8.3 g/dL — AB (ref 13.0–17.0)
MCH: 28.7 pg (ref 26.0–34.0)
MCHC: 31.2 g/dL (ref 30.0–36.0)
MCV: 92 fL (ref 78.0–100.0)
PLATELETS: 133 10*3/uL — AB (ref 150–400)
RBC: 2.89 MIL/uL — AB (ref 4.22–5.81)
RDW: 18.2 % — ABNORMAL HIGH (ref 11.5–15.5)
WBC: 5 10*3/uL (ref 4.0–10.5)

## 2016-07-18 LAB — BASIC METABOLIC PANEL
ANION GAP: 7 (ref 5–15)
BUN: 10 mg/dL (ref 6–20)
CHLORIDE: 101 mmol/L (ref 101–111)
CO2: 26 mmol/L (ref 22–32)
Calcium: 8.7 mg/dL — ABNORMAL LOW (ref 8.9–10.3)
Creatinine, Ser: 0.75 mg/dL (ref 0.61–1.24)
GFR calc Af Amer: >60 mL/min (ref >60)
GLUCOSE: 179 mg/dL — AB (ref 65–99)
POTASSIUM: 4.1 mmol/L (ref 3.5–5.1)
SODIUM: 134 mmol/L — AB (ref 135–145)

## 2016-07-18 LAB — PROTIME-INR
INR: 1.71
INR: 1.9
PROTHROMBIN TIME: 20.3 s — AB (ref 11.4–15.2)
PROTHROMBIN TIME: 22.1 s — AB (ref 11.4–15.2)

## 2016-07-18 LAB — GLUCOSE, CAPILLARY
GLUCOSE-CAPILLARY: 137 mg/dL — AB (ref 65–99)
GLUCOSE-CAPILLARY: 194 mg/dL — AB (ref 65–99)
Glucose-Capillary: 252 mg/dL — ABNORMAL HIGH (ref 65–99)
Glucose-Capillary: 234 mg/dL — ABNORMAL HIGH (ref 65–99)

## 2016-07-18 LAB — LACTATE DEHYDROGENASE: LDH: 157 U/L (ref 98–192)

## 2016-07-18 LAB — HEPARIN LEVEL (UNFRACTIONATED): Heparin Unfractionated: 0.38 IU/mL (ref 0.30–0.70)

## 2016-07-18 MED ORDER — WARFARIN SODIUM 5 MG PO TABS
15.0000 mg | ORAL_TABLET | Freq: Once | ORAL | Status: AC
Start: 2016-07-18 — End: 2016-07-18
  Administered 2016-07-18: 15 mg via ORAL
  Filled 2016-07-18: qty 3

## 2016-07-18 NOTE — Progress Notes (Addendum)
ANTICOAGULATION CONSULT NOTE - Follow Up Consult  Pharmacy Consult for Heparin and Coumadin Indication: HVAD  Allergies  Allergen Reactions  . Metformin And Related Nausea And Vomiting    *Only the extended release* (does this)  . Niacin And Related Other (See Comments)    REACTION IS SIDE EFFECT "SEVERE" FLUSHING    Patient Measurements: Height: 6' (182.9 cm) Weight: 228 lb 9.6 oz (103.7 kg) IBW/kg (Calculated) : 77.6 Heparin Dosing Weight: 98kg  Vital Signs: Temp: 98 F (36.7 C) (07/17 0853) Temp Source: Oral (07/17 0853)  Labs:  Recent Labs  07/16/16 0354 07/17/16 0235 07/18/16 0255  HGB 9.0* 8.7* 8.3*  HCT 29.3* 27.9* 26.6*  PLT 150 131* 133*  LABPROT 18.5* 18.4* 20.3*  INR 1.53 1.51 1.71  HEPARINUNFRC 0.40 0.45 0.38  CREATININE 0.89 0.89 0.75    Estimated Creatinine Clearance: 120.7 mL/min (by C-G formula based on SCr of 0.75 mg/dL).   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 Admitted with melena and symptomatic anemia. INR 3.29 on admit and coumadin held.   S/p EGD on 7/9 with ablations of gastric AVMs x2. Heparin restarted post EGD. Heparin level at goal at 0.38. Hgb down some today but had bleeding at IV site. LDH stable. ASA 81mg  restarted 7/12. Coumadin restarted 7/11. INR 1.71 today - slow to increase.  Home dose: 10mg  daily except 12.5mg  Mon/Wed  Goal of Therapy:  Heparin level 0.3-0.5 INR 2-2.5 Monitor platelets by anticoagulation protocol: Yes   Plan:  1) Continue heparin at 1600 units/hr 2) Recheck INR at 1400 this afternoon to determine coumadin dose  Louie CasaJennifer Jonan Seufert, PharmD, BCPS 07/18/2016 12:12 PM    ADDENDUM: INR 1.9 this afternoon. Will give one more boosted dose of coumadin 15mg  tonight to ensure INR > 2 tomorrow. Can likely stop heparin as well - will verify.   Louie CasaJennifer Daleen Steinhaus, PharmD, BCPS 07/18/2016, 3:34 PM

## 2016-07-18 NOTE — Progress Notes (Addendum)
Patient had bleeding three times from his IV site that was discontinued yesterday, pressure dressing applied after holding pressure on the site each time for 10 minutes, bleeding now under control VAD pager paged for further instructions, awaiting call back, patient is not in distress, vital  signs remain stable, will continue to monitor.   1821. This RN spoke with Cathlean CowerLesley RN and Homero FellersFrank Pharmacist, instructions given to continue the heparin drip and give  scheduled coumadin and continue to apply pressure dressing as needed. Bleeding now under control , with Coban dressing applied, patient resting quietly in bed , call bell within reach will continue to monitor.

## 2016-07-18 NOTE — Progress Notes (Signed)
Inpatient Diabetes Program Recommendations  AACE/ADA: New Consensus Statement on Inpatient Glycemic Control (2015)  Target Ranges:  Prepandial:   less than 140 mg/dL      Peak postprandial:   less than 180 mg/dL (1-2 hours)      Critically ill patients:  140 - 180 mg/dL   Results for [name], [name] (MRN [id number]) as of 07/18/2016 10:00  Ref. Range 07/17/2016 06:08 07/17/2016 11:38 07/17/2016 16:57 07/17/2016 21:55 07/18/2016 06:29  Glucose-Capillary Latest Ref Range: 65 - 99 mg/dL 409165 (H) 811249 (H) 914216 (H) 286 (H) 137 (H)   Review of Glycemic Control  Outpatient DM medication: 70/30 30 units with breakfast, 70/30 15-20 units with lunch, 70/30 15-20 units with supper Current orders for Inpatient glycemic control: Lantus 20 units daily, Novolog 0-9 units TID with meals, Novolog 0-5 units QHS  Inpatient Diabetes Program Recommendations: Insulin - Meal Coverage: Post prandial glucose is consistently elevated. Please consider ordering Novolog 4 units TID with meals for meal coverage if patient eats at least 50% of meals.  Thanks, Orlando PennerMarie Ed Rayson, RN, MSN, CDE Diabetes Coordinator Inpatient Diabetes Program [phone] (Team Pager from 8am to 5pm)

## 2016-07-18 NOTE — Progress Notes (Addendum)
Drive line exit care and daily dressing change completed using sterile techniques. Small amount of tan colored drainage noted, no foul odor,redness or tenderness noted. New VAD anchor applied after dressing change, will continue to monitor.

## 2016-07-18 NOTE — Progress Notes (Signed)
Patient ID: [name], male   DOB: 09/01/1955, 61 y.o.   MRN: [id number]   Advanced Heart Failure VAD Team Note  Subjective:    Admitted with GI bleed. Overall received 3UPRBCs. S/P EGD Enteroscopy--> 2 AVMs APC.   On heparin drip. INR 1.7. Hgb 8.3. Had bleeding when IV was removed. 1BM greenish brown.   On 7/15 flattened waveforms--> Given 500 cc NS bolus. Speed cut back to 2840. CT pump position and outflow graft ok.   Overall feeling fine. Frustrated and wants to go home.   HVAD INTERROGATION:  Flow5.0 liters/min, speed 2840 rpm, power 5.2 Peak 7 Trough 4.0 No suction alarms. Suction on. Lavare on.    Objective:    Vital Signs:   Temp:  [97.8 F (36.6 C)-98.4 F (36.9 C)] 97.8 F (36.6 C) (07/17 0435) Pulse Rate:  [72-83] 81 (07/16 2157) Resp:  [16-20] 18 (07/17 0626) BP: (88)/(75) 88/75 (07/16 1239) SpO2:  [92 %-100 %] 92 % (07/17 0435) Weight:  [228 lb 9.6 oz (103.7 kg)] 228 lb 9.6 oz (103.7 kg) (07/17 0626) Last BM Date: 07/16/16 Mean arterial Pressure 70s   Intake/Output:   Intake/Output Summary (Last 24 hours) at 07/18/16 0810 Last data filed at 07/17/16 1730  Gross per 24 hour  Intake              720 ml  Output             1000 ml  Net             -280 ml     Physical Exam    Physical Exam: GENERAL: Well appearing, male. Sitting on the side of the bed.  HEENT: normal  NECK: Supple, JVP 8.  2+ bilaterally, no bruits.  No lymphadenopathy or thyromegaly appreciated.   CARDIAC:  Mechanical heart sounds with LVAD hum present.  LUNGS:  Clear to auscultation bilaterally.  ABDOMEN:  Soft, round, nontender, positive bowel sounds x4.     LVAD exit site: well-healed and incorporated.  Dressing dry and intact.  No erythema or drainage.  Stabilization device present and accurately applied.  Driveline dressing is being changed daily per sterile technique. EXTREMITIES:  Warm and dry, no cyanosis, clubbing, rash or edema . Chronic hyperpigmentation.  NEUROLOGIC:   Alert and oriented x 4.  Gait steady.  No aphasia.  No dysarthria.  Affect pleasant.        Telemetry   NSR with PVCs. 70-80s. Personally reviewed.      Labs   Basic Metabolic Panel:  Recent Labs Lab 07/14/16 0241 07/15/16 0457 07/16/16 0354 07/17/16 0235 07/18/16 0255  NA 132* 136 135 134* 134*  K 3.8 3.8 3.8 3.8 4.1  CL 97* 99* 99* 99* 101  CO2 28 31 30 29 26   GLUCOSE 270* 267* 160* 228* 179*  BUN 8 7 7 10 10   CREATININE 0.95 0.86 0.89 0.89 0.75  CALCIUM 8.3* 8.8* 8.8* 8.6* 8.7*    Liver Function Tests: No results for input(s): AST, ALT, ALKPHOS, BILITOT, PROT, ALBUMIN in the last 168 hours. No results for input(s): LIPASE, AMYLASE in the last 168 hours. No results for input(s): AMMONIA in the last 168 hours.  CBC:  Recent Labs Lab 07/14/16 0241 07/15/16 0457 07/16/16 0354 07/17/16 0235 07/18/16 0255  WBC 6.8 6.0 6.2 5.6 5.0  HGB 8.3* 8.8* 9.0* 8.7* 8.3*  HCT 26.0* 28.2* 29.3* 27.9* 26.6*  MCV 90.6 91.9 91.8 91.8 92.0  PLT 143* 151 150 131* 133*  INR:  Recent Labs Lab 07/14/16 0241 07/15/16 0458 07/16/16 0354 07/17/16 0235 07/18/16 0255  INR 1.28 1.43 1.53 1.51 1.71    Other results:   Imaging   Ct Chest W Contrast  Result Date: 07/16/2016 CLINICAL DATA:  61 year old male with history of left ventricular assist device. Evaluate pump position and patency of outflow graft. EXAM: CT CHEST WITH CONTRAST TECHNIQUE: Multidetector CT imaging of the chest was performed during intravenous contrast administration. CONTRAST:  75mL ISOVUE-300 IOPAMIDOL (ISOVUE-300) INJECTION 61% COMPARISON:  Chest CT 05/11/2016. FINDINGS: LVAD: Left ventricular assist device in place in the apex of the left ventricle with the inflow cannula slightly directed toward the interventricular septum, but otherwise with the cannula well aligned with the long axis of the left ventricle. The outflow cannula is partially obscured by extensive beam hardening artifact. This is most  evident immediately adjacent to the device, and as the cannula extends toward the midline. The mid to distal portion of the outflow cannula is well visualized and is widely patent without evidence of hemodynamically significant stenosis all the way to the level of the anastomosis in the distal ascending thoracic aorta. Drive line is incompletely visualized, but adjacent to the visualized portions of the drive line there are no unexpected fluid collections. No unexpected fluid collection adjacent to the left ventricular assist device or surrounding the left ventricular apex. Cardiovascular: Heart size is normal. Small amount of pericardial fluid and/or thickening, unlikely to be of much hemodynamic significance. No pericardial calcification. Extensive dystrophic calcifications throughout the apex of the left ventricle, presumably related to prior low distal LAD territory myocardial infarction. There is aortic atherosclerosis, as well as atherosclerosis of the great vessels of the mediastinum and the coronary arteries, including calcified atherosclerotic plaque in the left anterior descending, left circumflex and right coronary arteries. Left-sided pacemaker/ AICD in place with lead tip terminating in the right ventricular apex. Mediastinum/Nodes: Several borderline enlarged and mildly enlarged mediastinal lymph nodes are noted measuring up to 12 mm in short axis in the subcarinal nodal station. Esophagus is unremarkable in appearance. No axillary lymphadenopathy. Lungs/Pleura: Small right and trace left pleural effusions lie dependently. Some associated passive subsegmental atelectasis in the dependent portions of the lungs bilaterally. No acute consolidative airspace disease. No pleural effusions. A few tiny calcified granulomas are noted throughout the lung bases. No larger more suspicious appearing pulmonary nodules or masses are noted. Upper Abdomen: Aortic atherosclerosis. Musculoskeletal: There are no  aggressive appearing lytic or blastic lesions noted in the visualized portions of the skeleton. Median sternotomy wires. Healing median sternotomy wound. IMPRESSION: 1. Expected postoperative appearance of new Heartmate 3 LVAD, as above. Although the proximal third of the outflow cannula is largely obscured by beam hardening artifact from the patient's device, the distal 2/3 of the cannula is widely patent without evidence of hemodynamically significant stenosis. 2. Aortic atherosclerosis, in addition to 3 vessel coronary artery disease, with extensive dystrophic calcification throughout the left ventricular apex, presumably related to prior distal LAD territory myocardial infarction(s). 3. Small right and trace left pleural effusions lying dependently with some associated passive subsegmental atelectasis in the item portions of the lungs bilaterally. 4. Several borderline enlarged and mildly enlarged mediastinal lymph nodes are nonspecific and likely reactive in the setting of recent surgery. 5. Additional incidental findings, as above. Aortic Atherosclerosis (ICD10-I70.0). Electronically Signed   By: Trudie Reed M.D.   On: 07/16/2016 16:17     Medications:     Scheduled Medications: . aspirin EC  81  mg Oral Daily  . atorvastatin  40 mg Oral q1800  . docusate sodium  100 mg Oral BID  . ezetimibe  10 mg Oral q1800  . ferrous fumarate-b12-vitamic C-folic acid  1 capsule Oral BID BM  . gabapentin  100 mg Oral TID  . insulin aspart  0-5 Units Subcutaneous QHS  . insulin aspart  0-9 Units Subcutaneous TID WC  . insulin glargine  20 Units Subcutaneous Daily  . losartan  25 mg Oral BID  . pantoprazole  40 mg Oral BID  . potassium chloride  20 mEq Oral Daily  . sildenafil  20 mg Oral TID  . Warfarin - Pharmacist Dosing Inpatient   Does not apply q1800    Infusions: . sodium chloride    . sodium chloride    . heparin 1,600 Units/hr (07/17/16 1517)    PRN Medications: acetaminophen,  ondansetron (ZOFRAN) IV   Patient Profile   [name] is a 61 year old with history of chronic systolic heart failure, recent HVAD implant on 05/30/2016, pulmonary HTN, and PAD.   Assessment/Plan:    1. Acute blood loss anemia due to acute upper GI bleeding: Patient presented with symptomatic anemia and melena.  Increased fatigue, hemoglobin down to 7.  INR supratherapeutic at admission.  He had been on ASA 162 mg daily and warfarin. - He has had 3u PRBCs. - S/P 07/10/2016 EGD + Enteroscopy--> 2 AVMs with APC, suspect AVM bleeding.  He will need octreotide injections after discharge.  - Hgb down to 8.3 but had bleeding when IV was removed.  - On heparin/coumadin. On lower dose of asa 81 mg daily.   - Continue oral PPI.    - INR trending up 1.7. Discussed with PharmD personally. Can stop heparin when INR > 1.8. Home when INR > 2.0  2. Chronic systolic CHF with HVAD in place: Ischemic cardiomyopathy, St Jude ICD.  He has had trouble with RV failure and volume overload but had been improving on Revatio + torsemide 80 mg bid at home.  HVAD present. HVAD parameters reviewed and stable.    -On 7/15  he had flattened waveforms. Seems to be preload dependent/RV failure and improved after 500 cc NS bolus.   - Continue reduced speed 2840.  - will start back on torsemide 40 daily.    - Can consider repeat ramp echo prior to d/c - Continue Revatio.  - INR  management as above. Discussed with PharmD personally  3. ID: Increased drainage around driveline on admit. Resolved. Finished Keflex 7/13  4. Smoking: He has restarted.  Encouraged to quit.   5. Atrial fibrillation: Paroxysmal.  Off amio due to nausea. Maitaining NSR.  On coumadin.   6.  CAD: Stable, continues on statin.  - no s/s ischemia  7. Hypokalemia Resolved K 4.1   8. Uncontrolled Diabetes:  -Diabetes Coordinator recommendations appreciated. Glucose remains high.   9. PVCs - Likely due to suction events.  Home later this week when  INR >2 . Hopefully tomorrow.    Length of Stay: 11  [name], NP 07/18/2016, 8:10 AM  VAD Team --- VAD ISSUES ONLY--- Pager [phone] (7am - 7am) Advanced Heart Failure Team  Pager [phone] (M-F; 7a - 4p)  Please contact [facility] for night-coverage after hours (4p -7a ) and weekends on [email or web address]  Patient seen with NP, agree with the above note.  He is doing ok today.  Volume status looks like it is increasing a bit though weight is  not changed.  I will put him back on torsemide 40 mg daily today and follow HVAD waveform.    No further overt GI bleeding.  Hemoglobin a little lower but apparently had profuse bleeding from IV site yesterday.  Continue heparin gtt until INR 1.8 and keep in hospital until INR > 2.  He is on lower dose of ASA, 81 daily.  He will need octreotide injections after discharge.   Marca AnconaDalton Coraima Tibbs 07/18/2016 10:27 AM

## 2016-07-18 NOTE — Progress Notes (Signed)
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   Vital signs: HR: 74 Doppler Pressure:74 Automatic BP: 82/68 (74) O2 Sat: 100 Room Air Wt: 102.3> 99.7kg>101.2>101.1>103.6kg>103.7   LVAD interrogation reveals:  Speed: 2840 Flow: 5 Power: 5.2 Peak/Trough: 5.2/4.0 Alarm Limits: 3.5/8 Events: none overnight HCT: 28- adjusted personally today  Suction alarm: on  Lavare: on   Drive Line: Pt needs daily dressing changes with silver strip.   Blood Products: 2 units PRBC-07/07/16 1 unit PRBC-07/08/16 1 unit PRBC-07/09/16  Labs:  LDH trend:156>155>159>152>150>157  INR trend: 2.82> 2.09 > 1.57 > 1.48>1.26>1.15>1.18>1.43>1.53>1.51>1.71  Hgb: 8.6>8.9>8.8>9.0>8.7>8.3  Gtts: Heparin 1600 u/hr  Anticoagulation Plan: -INR Goal: 2-2.5 -ASA Dose: 81mg   Adverse Events on VAD: GI bleed-07/07/16   Plan/Recommendations:   1. Daily dressing changes.  2. Continue Heparin bridge.  3. Home when INR>2   Marcellus ScottLesley Secily Walthour RN, VAD Coordinator 24/7 pager [phone]

## 2016-07-19 ENCOUNTER — Other Ambulatory Visit (HOSPITAL_COMMUNITY): Payer: Self-pay | Admitting: Pharmacist

## 2016-07-19 ENCOUNTER — Telehealth (HOSPITAL_COMMUNITY): Payer: Self-pay | Admitting: *Deleted

## 2016-07-19 ENCOUNTER — Other Ambulatory Visit: Payer: Self-pay | Admitting: Pharmacist

## 2016-07-19 LAB — CBC
HEMATOCRIT: 26.8 % — AB (ref 39.0–52.0)
Hemoglobin: 8.4 g/dL — ABNORMAL LOW (ref 13.0–17.0)
MCH: 29.1 pg (ref 26.0–34.0)
MCHC: 31.3 g/dL (ref 30.0–36.0)
MCV: 92.7 fL (ref 78.0–100.0)
Platelets: 130 10*3/uL — ABNORMAL LOW (ref 150–400)
RBC: 2.89 MIL/uL — ABNORMAL LOW (ref 4.22–5.81)
RDW: 18.5 % — AB (ref 11.5–15.5)
WBC: 4.3 10*3/uL (ref 4.0–10.5)

## 2016-07-19 LAB — BASIC METABOLIC PANEL
Anion gap: 6 (ref 5–15)
BUN: 10 mg/dL (ref 6–20)
CALCIUM: 8.8 mg/dL — AB (ref 8.9–10.3)
CHLORIDE: 99 mmol/L — AB (ref 101–111)
CO2: 27 mmol/L (ref 22–32)
CREATININE: 0.86 mg/dL (ref 0.61–1.24)
GFR calc non Af Amer: >60 mL/min (ref >60)
GLUCOSE: 233 mg/dL — AB (ref 65–99)
Potassium: 4.9 mmol/L (ref 3.5–5.1)
Sodium: 132 mmol/L — ABNORMAL LOW (ref 135–145)

## 2016-07-19 LAB — PROTIME-INR
INR: 1.83
Prothrombin Time: 21.4 seconds — ABNORMAL HIGH (ref 11.4–15.2)

## 2016-07-19 LAB — GLUCOSE, CAPILLARY: Glucose-Capillary: 198 mg/dL — ABNORMAL HIGH (ref 65–99)

## 2016-07-19 LAB — LACTATE DEHYDROGENASE: LDH: 156 U/L (ref 98–192)

## 2016-07-19 MED ORDER — WARFARIN SODIUM 5 MG PO TABS
20.0000 mg | ORAL_TABLET | ORAL | Status: AC
  Administered 2016-07-19: 20 mg via ORAL
  Filled 2016-07-19: qty 4

## 2016-07-19 MED ORDER — DOCUSATE SODIUM 100 MG PO CAPS: 100.0000 mg | ORAL_CAPSULE | Freq: Two times a day (BID) | ORAL | 0 refills | Status: DC

## 2016-07-19 MED ORDER — WARFARIN SODIUM 5 MG PO TABS
ORAL_TABLET | ORAL | 6 refills | Status: DC
Start: 2016-07-20 — End: 2016-08-04

## 2016-07-19 MED ORDER — TORSEMIDE 20 MG PO TABS: 40.0000 mg | ORAL_TABLET | Freq: Every day | ORAL | 3 refills | Status: DC

## 2016-07-19 MED ORDER — TORSEMIDE 20 MG PO TABS
80.0000 mg | ORAL_TABLET | Freq: Every day | ORAL | Status: DC
  Administered 2016-07-19: 80 mg via ORAL
  Filled 2016-07-19: qty 4

## 2016-07-19 MED ORDER — PANTOPRAZOLE SODIUM 40 MG PO TBEC: 40.0000 mg | DELAYED_RELEASE_TABLET | Freq: Two times a day (BID) | ORAL | 6 refills | Status: DC

## 2016-07-19 MED ORDER — ASPIRIN 81 MG PO TBEC: 81.0000 mg | DELAYED_RELEASE_TABLET | Freq: Every day | ORAL | 6 refills | Status: DC

## 2016-07-19 MED ORDER — TORSEMIDE 20 MG PO TABS: 80.0000 mg | ORAL_TABLET | Freq: Every day | ORAL | 3 refills | Status: DC

## 2016-07-19 MED ORDER — GABAPENTIN 100 MG PO CAPS: 100.0000 mg | ORAL_CAPSULE | Freq: Two times a day (BID) | ORAL | 6 refills | Status: DC | PRN

## 2016-07-19 NOTE — Progress Notes (Addendum)
Patient ID: [name], male   DOB: [birth year], 61 y.o.   MRN: [id number]   Advanced Heart Failure VAD Team Note  Subjective:    Admitted with GI bleed. Overall received 3UPRBCs. S/P EGD Enteroscopy--> 2 AVMs APC.   On heparin drip. INR 1.7. Hgb 8.3. Had bleeding when IV was removed. 1 BM greenish brown.   On 7/15 flattened waveforms--> Given 500 cc NS bolus. Speed cut back to 2840. CT pump position and outflow graft ok.   Yesterday heparin stopped. INR 1.8 today.   Wants to go home. Denies SOB.   HVAD INTERROGATION:  Flow 5.1 liters/min, speed 2840rpm, power 5 Peak 7. Trough 3.8 No suction alarms. Suction on. Lavare on.    Objective:    Vital Signs:   Temp:  [98 F (36.7 C)-98.3 F (36.8 C)] 98.2 F (36.8 C) (07/18 0518) Pulse Rate:  [76] 76 (07/17 1218) Resp:  [15-18] 15 (07/18 0518) BP: (82-84)/(68-74) 84/74 (07/17 1601) SpO2:  [100 %] 100 % (07/18 0518) Weight:  [231 lb 9.6 oz (105.1 kg)] 231 lb 9.6 oz (105.1 kg) (07/18 0518) Last BM Date: 07/18/16 Mean arterial Pressure 70s   Intake/Output:   Intake/Output Summary (Last 24 hours) at 07/19/16 0831 Last data filed at 07/19/16 0531  Gross per 24 hour  Intake              240 ml  Output             1600 ml  Net            -1360 ml     Physical Exam     Physical Exam: GENERAL: Well appearing, male. In bed. NAD  HEENT: normal  NECK: Supple, JVP  8-9.  2+ bilaterally, no bruits.  No lymphadenopathy or thyromegaly appreciated.   CARDIAC:  Mechanical heart sounds with LVAD hum present.  LUNGS:  Clear to auscultation bilaterally.  ABDOMEN:  Soft, round, nontender, positive bowel sounds x4.     LVAD exit site: well-healed and incorporated.  Dressing dry and intact.  No erythema or drainage.  Stabilization device present and accurately applied.  Driveline dressing is being changed daily per sterile technique. EXTREMITIES:  Warm and dry, no cyanosis, clubbing, rash or edema  NEUROLOGIC:  Alert and oriented x 4.   Gait steady.  No aphasia.  No dysarthria.  Affect pleasant.           Telemetry   NSR 70-80s personally reviewed.     Labs   Basic Metabolic Panel:  Recent Labs Lab 07/15/16 0457 07/16/16 0354 07/17/16 0235 07/18/16 0255 07/19/16 0245  NA 136 135 134* 134* 132*  K 3.8 3.8 3.8 4.1 4.9  CL 99* 99* 99* 101 99*  CO2 31 30 29 26 27   GLUCOSE 267* 160* 228* 179* 233*  BUN 7 7 10 10 10   CREATININE 0.86 0.89 0.89 0.75 0.86  CALCIUM 8.8* 8.8* 8.6* 8.7* 8.8*    Liver Function Tests: No results for input(s): AST, ALT, ALKPHOS, BILITOT, PROT, ALBUMIN in the last 168 hours. No results for input(s): LIPASE, AMYLASE in the last 168 hours. No results for input(s): AMMONIA in the last 168 hours.  CBC:  Recent Labs Lab 07/15/16 0457 07/16/16 0354 07/17/16 0235 07/18/16 0255 07/19/16 0245  WBC 6.0 6.2 5.6 5.0 4.3  HGB 8.8* 9.0* 8.7* 8.3* 8.4*  HCT 28.2* 29.3* 27.9* 26.6* 26.8*  MCV 91.9 91.8 91.8 92.0 92.7  PLT 151 150 131* 133* 130*  INR:  Recent Labs Lab 07/16/16 0354 07/17/16 0235 07/18/16 0255 07/18/16 1424 07/19/16 0245  INR 1.53 1.51 1.71 1.90 1.83    Other results:   Imaging   No results found.   Medications:     Scheduled Medications: . aspirin EC  81 mg Oral Daily  . atorvastatin  40 mg Oral q1800  . docusate sodium  100 mg Oral BID  . ezetimibe  10 mg Oral q1800  . ferrous fumarate-b12-vitamic C-folic acid  1 capsule Oral BID BM  . gabapentin  100 mg Oral TID  . insulin aspart  0-5 Units Subcutaneous QHS  . insulin aspart  0-9 Units Subcutaneous TID WC  . insulin glargine  20 Units Subcutaneous Daily  . losartan  25 mg Oral BID  . pantoprazole  40 mg Oral BID  . potassium chloride  20 mEq Oral Daily  . sildenafil  20 mg Oral TID  . Warfarin - Pharmacist Dosing Inpatient   Does not apply q1800    Infusions: . sodium chloride    . sodium chloride      PRN Medications: acetaminophen, ondansetron (ZOFRAN) IV   Patient Profile     [name] is a 61 year old with history of chronic systolic heart failure, recent HVAD implant on 05/30/2016, pulmonary HTN, and PAD.   Assessment/Plan:    1. Acute blood loss anemia due to acute upper GI bleeding: Patient presented with symptomatic anemia and melena.  Increased fatigue, hemoglobin down to 7.  INR supratherapeutic at admission.  He had been on ASA 162 mg daily and warfarin. - He has had 3u PRBCs. - S/P 07/10/2016 EGD + Enteroscopy--> 2 AVMs with APC, suspect AVM bleeding.  He will need octreotide injections after discharge.  - Hgb 8.4. INR 1.83.  Off heparin.    - Continue oral PPI.    - INR trending up 1.7. Discussed with PharmD personally. Can stop heparin when INR > 1.8. Home when INR > 2.0  2. Chronic systolic CHF with HVAD in place: Ischemic cardiomyopathy, St Jude ICD.  He has had trouble with RV failure and volume overload but had been improving on Revatio + torsemide 80 mg bid at home.  HVAD present. HVAD parameters reviewed and stable.    -On 7/15  he had flattened waveforms. Seems to be preload dependent/RV failure and improved after 500 cc NS bolus.   - Continue reduced speed 2840.  - Continue torsemide 40 daily.    - - Continue Revatio.  INR trending up 1.8. Give 20 mg coumadin today and tomorrow. Repeat INR on Friday. Pharmacy will educate prior to d/c on foods that interact with coumadin.   3. ID: Increased drainage around driveline on admit. Resolved. Finished Keflex 7/13  4. Smoking: He has restarted.  Encouraged to quit.   5. Atrial fibrillation: Paroxysmal.  Off amio due to nausea. In NSR.  On coumadin.   6.  CAD: Stable, continues on statin.  - no s/s ischemia  7. Hypokalemia Resolved K 4.9    8. Uncontrolled Diabetes:  -Diabetes Coordinator recommendations appreciated. Glucose remains high.   9. PVCs - Likely due to suction events.   Home today. Discussed with pharmacy    Length of Stay: 12  Tonye BecketAmy Clegg, NP 07/19/2016, 8:31 AM  VAD Team  --- VAD ISSUES ONLY--- Pager [phone] (7am - 7am) Advanced Heart Failure Team  Pager [phone] (M-F; 7a - 4p)  Please contact [facility] for night-coverage after hours (4p -7a ) and  weekends on [email or web address]  Patient seen with NP, agree with the above note.  Hemoglobin stable today, no overt bleeding.  Feels good, probably developing mild volume overload. VAD parameters reviewed and stable.   I think he can go home today.  Will need followup in VAD clinic for INR on Friday, full visit next week.  Need to arrange for octreotide injections as outpatient.  Increase torsemide to 80 mg daily for discharge.   Home meds: Warfarin goal INR 2-2.5 ASA 81 Losartan 25 bid Torsemide 80 daily Atorvastatin 40 Zetia 10 Revatio 20 tid KCl 20 daily  Marca Ancona 07/19/2016 9:36 AM

## 2016-07-19 NOTE — Discharge Summary (Signed)
Advanced Heart Failure Team  Discharge Summary   Patient ID: [name] MRN: [id number], DOB/AGE: April 09, 1955 61 y.o. Admit date: 07/07/2016 D/C date:     07/19/2016   Primary Discharge Diagnoses: 1. Acute Blood Loss Anemia due to Acute Upper GIB S/P EGD Enteroscopy with 2 AVMs. On 07/10/2016 2. Chronic Systolic Heart Failure with HVAD Speed 2840  On ASA 81 mg daily + coumadin. INR goal 2-2.5 3. ID 4. Smoking 5. A fib Amio stopped on admit due to nausea.  6. CAD 7. Hypokalemia 8. Uncontrolled Diabetes 9. PVCs     Hospital Course:  [name] is a 61 year old with history of chronic systolic heart failure, recent HVAD implant on 05/30/2016, pulmonary HTN, and PAD admitted wiith GIB. Hgb down 7. On admit. GI consulted. ASA and coumadin held. Once INR drifted down he underwent EGD and enteroscopy. This showed 2 AVMs. Overall received 3UPRBCs. Once bleeding resolved he resumed heparin and coumadin. Discharged once INR was 1.8. He will take 20 mg coumadin today and tomorrow. Plan to check INR on Friday. He will continue to be followed closely in the VAD clinic.   1. Acute blood loss anemia due to acute upper GI bleeding: Patient presented with symptomatic anemia and melena. Increased fatigue, hemoglobin down to 7. INR supratherapeutic at admission. He had been on ASA 162 mg daily and warfarin. - Overall eceived 3UPRBCs. Once INR was lower he undersend GI procedure.  - S/P 07/10/2016 EGD + Enteroscopy--> 2 AVMs with APC, suspect AVM bleeding.  Placed on heparin once bleeding stpped. He will need octreotide injections after discharge. Will set up as an outpatient.  - Day of discharge-->Hgb 8.4. INR 1.83.  - Continue oral PPI.    2. Chronic systolic CHF with HVAD in place: Ischemic cardiomyopathy, St Jude ICD.  He has had trouble with RV failure and volume overload but had been improving on Revatio + torsemide 80 mg bid at home.  HVAD present. HVAD parameters reviewed and stable.   -On 7/15  he  had flattened waveforms. Seems to be preload dependent/RV failure and improved after 500 cc NS bolus.  Continue reduced speed 2840.  - Increase torsemide to 80 mg daily watch closely for suction issues because he will likely eat foods with higher salt intake.  .Continue Revatio. Give 20 mg coumadin today and tomorrow. Repeat INR on Friday in the VAD clinic.  Pharmacy will educate prior to d/c on foods that interact with coumadin.  3. ID: Increased drainage around driveline on admit. Resolved. Finished Keflex 7/13 4. Smoking: He has restarted.  Encouraged to quit. 5. Atrial fibrillation: Paroxysmal.  Off amio due to nausea. In NSR.  On coumadin.  6.  CAD: Stable, continues on statin. no s/s ischemia 7. Hypokalemia:Resolved K 4.9  8. Uncontrolled Diabetes: -Diabetes Coordinator recommendations appreciated. Insulin adjusted inpatient.  9. PVCs:- Likely due to suction events.  HVAD INTERROGATION:  Flow 5.1 liters/min, speed 2840rpm, power 5 Peak 7. Trough 3.8 No suction alarms. Suction on. Lavare on.       Discharge Weight Range: 231 pounds.  Discharge Vitals: Blood pressure (!) 84/74, pulse 76, temperature 98.2 F (36.8 C), temperature source Oral, resp. rate 15, height 6' (1.829 m), weight 231 lb 9.6 oz (105.1 kg), SpO2 100 %.  Labs: Lab Results  Component Value Date   WBC 4.3 07/19/2016   HGB 8.4 (L) 07/19/2016   HCT 26.8 (L) 07/19/2016   MCV 92.7 07/19/2016   PLT 130 (L) 07/19/2016  Recent Labs Lab 07/19/16 0245  NA 132*  K 4.9  CL 99*  CO2 27  BUN 10  CREATININE 0.86  CALCIUM 8.8*  GLUCOSE 233*   Lab Results  Component Value Date   CHOL 103 03/10/2016   HDL 49 03/10/2016   LDLCALC 45 03/10/2016   TRIG 43 03/10/2016   BNP (last 3 results)  Recent Labs  05/24/16 1120 05/31/16 0228 06/06/16 0016  BNP 374.5* 270.0* 398.6*    ProBNP (last 3 results) No results for input(s): PROBNP in the last 8760 hours.   Diagnostic Studies/Procedures   No  results found.  Discharge Medications   Allergies as of 07/19/2016      Reactions   Metformin And Related Nausea And Vomiting   *Only the extended release* (does this)   Niacin And Related Other (See Comments)   REACTION IS SIDE EFFECT "SEVERE" FLUSHING      Medication List    STOP taking these medications   amiodarone 200 MG tablet Commonly known as:  PACERONE   spironolactone 25 MG tablet Commonly known as:  ALDACTONE     TAKE these medications   aspirin 81 MG EC tablet Take 1 tablet (81 mg total) by mouth daily. What changed:  medication strength  how much to take   atorvastatin 40 MG tablet Commonly known as:  LIPITOR Take 1 tablet (40 mg total) by mouth daily.   docusate sodium 100 MG capsule Commonly known as:  COLACE Take 1 capsule (100 mg total) by mouth 2 (two) times daily.   ezetimibe 10 MG tablet Commonly known as:  ZETIA Take 1 tablet (10 mg total) by mouth daily.   ferrous fumarate-b12-vitamic C-folic acid capsule Commonly known as:  TRINSICON / FOLTRIN Take 1 capsule by mouth 2 (two) times daily between meals.   gabapentin 100 MG capsule Commonly known as:  NEURONTIN Take 1 capsule (100 mg total) by mouth every 12 (twelve) hours as needed.   insulin NPH-regular Human (70-30) 100 UNIT/ML injection Commonly known as:  NOVOLIN 70/30 Inject 15-30 Units into the skin See admin instructions. 30 units in the morning, 15-20 units in the afternoon, 15-20 units in the evening   losartan 25 MG tablet Commonly known as:  COZAAR Take 1 tablet (25 mg total) by mouth 2 (two) times daily.   pantoprazole 40 MG tablet Commonly known as:  PROTONIX Take 1 tablet (40 mg total) by mouth 2 (two) times daily. What changed:  when to take this   potassium chloride SA 20 MEQ tablet Commonly known as:  K-DUR,KLOR-CON Take 1 tablet (20 mEq total) by mouth daily.   sildenafil 20 MG tablet Commonly known as:  REVATIO Take 1 tablet (20 mg total) by mouth 3 (three)  times daily.   torsemide 20 MG tablet Commonly known as:  DEMADEX Take 4 tablets (80 mg total) by mouth daily. What changed:  how much to take  how to take this  when to take this  additional instructions   warfarin 5 MG tablet Commonly known as:  COUMADIN Take 20 mg  7/19 What changed:  additional instructions       Disposition   The patient will be discharged in stable condition to home. Discharge Instructions    (HEART FAILURE PATIENTS) Call MD:  Anytime you have any of the following symptoms: 1) 3 pound weight gain in 24 hours or 5 pounds in 1 week 2) shortness of breath, with or without a dry hacking cough 3) swelling in  the hands, feet or stomach 4) if you have to sleep on extra pillows at night in order to breathe.    Complete by:  As directed    Diet - low sodium heart healthy    Complete by:  As directed    Heart Failure patients record your daily weight using the same scale at the same time of day    Complete by:  As directed    Increase activity slowly    Complete by:  As directed    Page VAD Coordinator at [phone]  Notify for: any VAD alarms    Complete by:  As directed    Notify for:  any VAD alarms   Speed Settings:    Complete by:  As directed    Fixed 2840     Follow-up Information    Laurey MoraleMcLean, Dalton S, MD Follow up on 07/21/2016.   Specialty:  Cardiology Why:  at 9:30  Contact information: [address] [place] [address] [phone]             Duration of Discharge Encounter: Greater than 35 minutes   Signed, Tonye Becketmy Clegg  07/19/2016, 9:33 AM

## 2016-07-19 NOTE — Telephone Encounter (Signed)
Received page from bedside nurse stating the patient had bleeding from old IV site. Nurse instructed to place a pressure dressing and monitor. Nurse instructed to give warfarin regardless of oozing. Nurse verbalized understanding.  Marcellus ScottLesley Kendyl Festa RN, VAD Coordinator 24/7 pager [phone]

## 2016-07-19 NOTE — Care Management Note (Signed)
Case Management Note Donn PieriniKristi Jesson Foskey RN, BSN Unit 2W-Case Manager [phone]   Patient Details  Name: Dalton RainwaterLarry D [name] MRN: [id number] Date of Birth: 17-May-1955  Subjective/Objective:  HVAD (placed 05/30/16) pt admitted with symptomatic anemia- GI workup-  And increased drainage around driveline- on abx                  Action/Plan: PTA pt lived at home with dad to assist- was not active with any HH services [facility] plan to d/c home once INR therapeutic no further evidence of bleeding.   Expected Discharge Date:  07/19/16               Expected Discharge Plan:  Home/Self CareHome/self care  In-House Referral:     Discharge planning Services  CM ConsultIn progress, will continue to follow  Post Acute Care Choice:  NA Choice offered to:  NA  DME Arranged:  N/A DME Agency:  NA  HH Arranged:  NA HH Agency:  NA  Status of Service:  Completed, signed off  If discussed at Long Length of Stay Meetings, dates discussed:     Discharge Disposition: home/self care   Additional Comments:  07/19/16- 1020- Cannen Dupras RN, CM- pt for d/c home today- no CM needs noted for discharge.   07/10/16- S/W AMBER C. @ [employer]  [place] : CONTACT FOR D/C PLANNING NEEDS PHONE #  [phone]  X [phone]-- per Verlee Monteora- CMS/CM dept.   Darrold SpanWebster, Dixie Jafri Hall, RN 07/19/2016, 10:23 AM

## 2016-07-19 NOTE — Progress Notes (Signed)
Presented to patients room today for drive line exit wound care. Existing VAD dressing removed and site care performed using sterile technique. Drive line exit site cleaned with Chlora prep applicators x 2, allowed to dry, and daily gauze dressing with silver strip re-applied. Exit site healed and incorporated, the velour is fully implanted at exit site. No redness, tenderness, drainage, foul odor or rash noted. Drive line anchor re-applied. Pt denies fever or chills.     Return on friday for another dressing change  and INR check per standard of care. 10 daily dressing kits and 6 anchors given to patient for home use. Plan is for discharge home this morning.  Marcellus ScottLesley Wilson RN, VAD Coordinator 24/7 pager [phone]

## 2016-07-20 ENCOUNTER — Encounter (HOSPITAL_COMMUNITY): Payer: Self-pay

## 2016-07-20 NOTE — Progress Notes (Signed)
[employer] disability claim extended to keep patient out of work for estimated 3-6 months. Faxed to provided # [phone] along with hospital discharge summary and completed forms. Copy of forms scanned into patient's electronic medical record.  [name], Dierks Wach [name], RN

## 2016-07-21 ENCOUNTER — Ambulatory Visit (HOSPITAL_COMMUNITY): Payer: Self-pay | Admitting: Pharmacist

## 2016-07-21 ENCOUNTER — Encounter (HOSPITAL_COMMUNITY)
Admission: RE | Admit: 2016-07-21 | Discharge: 2016-07-21 | Disposition: A | Discharge status: Home / Self Care | Payer: BLUE CROSS/BLUE SHIELD | Source: Ambulatory Visit | Attending: Internal Medicine | Admitting: Internal Medicine

## 2016-07-21 ENCOUNTER — Encounter (HOSPITAL_COMMUNITY): Payer: Self-pay

## 2016-07-21 ENCOUNTER — Other Ambulatory Visit (HOSPITAL_COMMUNITY): Payer: Self-pay | Admitting: Unknown Physician Specialty

## 2016-07-21 ENCOUNTER — Ambulatory Visit (HOSPITAL_COMMUNITY)
Admission: RE | Admit: 2016-07-21 | Discharge: 2016-07-21 | Disposition: A | Discharge status: Home / Self Care | Payer: BLUE CROSS/BLUE SHIELD | Source: Ambulatory Visit | Attending: Cardiology | Admitting: Cardiology

## 2016-07-21 ENCOUNTER — Telehealth (HOSPITAL_COMMUNITY): Payer: Self-pay | Admitting: *Deleted

## 2016-07-21 DIAGNOSIS — Z95811 Presence of heart assist device: Secondary | ICD-10-CM | POA: Insufficient documentation

## 2016-07-21 DIAGNOSIS — Z7901 Long term (current) use of anticoagulants: Secondary | ICD-10-CM | POA: Insufficient documentation

## 2016-07-21 DIAGNOSIS — K922 Gastrointestinal hemorrhage, unspecified: Secondary | ICD-10-CM | POA: Diagnosis not present

## 2016-07-21 LAB — PROTIME-INR
INR: 3.27
Prothrombin Time: 34.1 seconds — ABNORMAL HIGH (ref 11.4–15.2)

## 2016-07-21 MED ORDER — OCTREOTIDE ACETATE 20 MG IM KIT
20.0000 mg | PACK | INTRAMUSCULAR | Status: DC
  Administered 2016-07-21: 20 mg via INTRAMUSCULAR
  Filled 2016-07-21 (×2): qty 1

## 2016-07-21 NOTE — Telephone Encounter (Signed)
Called and spoke to patient to confirm he received his updated warfarin dose per [facility] Pharm[facility].   Marcellus ScottLesley Aslyn Cottman RN, VAD Coordinator 24/7 pager [phone]

## 2016-07-24 ENCOUNTER — Telehealth (HOSPITAL_COMMUNITY): Payer: Self-pay | Admitting: *Deleted

## 2016-07-24 NOTE — Telephone Encounter (Signed)
Paged by patient stating he was having pain where he received his Octreotide injection earlier today. Instructed patient to monitor this as it was a normal expectation. Please page if not resolved by tomorrow.   Dalton ScottLesley Wilson RN, VAD Coordinator 24/7 pager [phone]

## 2016-07-25 ENCOUNTER — Telehealth (HOSPITAL_COMMUNITY): Payer: Self-pay

## 2016-07-25 NOTE — Telephone Encounter (Signed)
Spoke with patient regarding [employer] benefits. Claim was denied, representative staes only MI, CABG, and transplant are covered. Advised patient to request appeal for LVAD and will help him complete this. Patient aware and appreciative.  [name], Airanna Partin [name], RN

## 2016-07-28 ENCOUNTER — Encounter (HOSPITAL_COMMUNITY): Payer: Self-pay

## 2016-07-28 ENCOUNTER — Ambulatory Visit (HOSPITAL_COMMUNITY): Payer: Self-pay | Admitting: Pharmacist

## 2016-07-28 ENCOUNTER — Inpatient Hospital Stay (HOSPITAL_COMMUNITY): Admission: RE | Admit: 2016-07-28 | Payer: Self-pay | Source: Ambulatory Visit

## 2016-07-28 ENCOUNTER — Ambulatory Visit (HOSPITAL_COMMUNITY)
Admission: RE | Admit: 2016-07-28 | Discharge: 2016-07-28 | Disposition: A | Discharge status: Home / Self Care | Payer: BLUE CROSS/BLUE SHIELD | Source: Ambulatory Visit | Attending: Cardiology | Admitting: Cardiology

## 2016-07-28 ENCOUNTER — Other Ambulatory Visit (HOSPITAL_COMMUNITY): Payer: Self-pay | Admitting: *Deleted

## 2016-07-28 DIAGNOSIS — I509 Heart failure, unspecified: Secondary | ICD-10-CM

## 2016-07-28 DIAGNOSIS — Z95810 Presence of automatic (implantable) cardiac defibrillator: Secondary | ICD-10-CM

## 2016-07-28 DIAGNOSIS — I5023 Acute on chronic systolic (congestive) heart failure: Secondary | ICD-10-CM | POA: Diagnosis not present

## 2016-07-28 DIAGNOSIS — Z95811 Presence of heart assist device: Secondary | ICD-10-CM

## 2016-07-28 DIAGNOSIS — I5022 Chronic systolic (congestive) heart failure: Secondary | ICD-10-CM | POA: Diagnosis not present

## 2016-07-28 LAB — CBC
HEMATOCRIT: 28.9 % — AB (ref 39.0–52.0)
Hemoglobin: 9 g/dL — ABNORMAL LOW (ref 13.0–17.0)
MCH: 28.9 pg (ref 26.0–34.0)
MCHC: 31.1 g/dL (ref 30.0–36.0)
MCV: 92.9 fL (ref 78.0–100.0)
PLATELETS: 174 10*3/uL (ref 150–400)
RBC: 3.11 MIL/uL — ABNORMAL LOW (ref 4.22–5.81)
RDW: 18.2 % — AB (ref 11.5–15.5)
WBC: 6.7 10*3/uL (ref 4.0–10.5)

## 2016-07-28 LAB — BASIC METABOLIC PANEL
Anion gap: 9 (ref 5–15)
BUN: 12 mg/dL (ref 6–20)
CALCIUM: 8.3 mg/dL — AB (ref 8.9–10.3)
CO2: 26 mmol/L (ref 22–32)
Chloride: 101 mmol/L (ref 101–111)
Creatinine, Ser: 1.02 mg/dL (ref 0.61–1.24)
GFR calc Af Amer: >60 mL/min (ref >60)
Glucose, Bld: 198 mg/dL — ABNORMAL HIGH (ref 65–99)
POTASSIUM: 2.9 mmol/L — AB (ref 3.5–5.1)
SODIUM: 136 mmol/L (ref 135–145)

## 2016-07-28 LAB — PROTIME-INR
INR: 2.49
PROTHROMBIN TIME: 27.4 s — AB (ref 11.4–15.2)

## 2016-07-28 LAB — LACTATE DEHYDROGENASE: LDH: 189 U/L (ref 98–192)

## 2016-07-28 MED ORDER — TORSEMIDE 20 MG PO TABS: 80.0000 mg | ORAL_TABLET | Freq: Two times a day (BID) | ORAL | 3 refills | Status: DC

## 2016-07-28 MED ORDER — POTASSIUM CHLORIDE CRYS ER 20 MEQ PO TBCR: EXTENDED_RELEASE_TABLET | ORAL | 3 refills | Status: DC

## 2016-07-28 NOTE — Patient Instructions (Addendum)
Increase Torsemide to 80 mg (4 tablets) twice daily.  Take 3 potassium pills today.  Increase potassium to 40 mEq in the morning and 20 mEq in the evening.  Return to clinic in 1 week

## 2016-07-28 NOTE — Progress Notes (Addendum)
Patient presents for 1 week  follow up in VAD Clinic today. Reports no problems with VAD equipment or concerns with drive line. States he is frustrated with "battling" his insurance. States he thought he was supposed to take his Torsemide 40 mg daily instead of the 80 mg twice daily as prescribed. He was referring to his AVS from discharge. Potassium level 2.9 on labs done in clinic today.   Vital Signs:  Doppler Pressure 86   Automatc BP: 105/75 (88) HR:86   SPO2:98  %  Weight: 233.4  lb w/o eqt Last weight: 230.2 lb Home weights: 230 lbs   VAD Indication: Destination therapy- current smoker    VAD interrogation & Equipment Management (reviewed with Dr. Shirlee LatchMcLean): Speed: 2840 Flow: 5.1 Power:5.3 w    Peak/Trough: 4.2/4.1 HCT- adjusted to 29% today Alarm settings: 3.5/8 Suction: on Lavare: On  Alarms: no clinical alarms Events: none   Exit Site Care: Drive line is being maintained every other day  by Dannielle Huhanny, his dad. Drive line exit site well healed and incorporated. The velour is fully implanted at exit site. Dressing dry and intact. No erythema or drainage. Stabilization device present and accurately applied. Pt denies fever or chills.  8 daily dressing kits given.  Significant Events on VAD Support:  05/31/16 - Controller power-up associated with pump start event. Controller change out performed.  06/01/16 - Controller pins lubricated per St Jude reps  06/17/16 - Controller power-up associated with pump start event while changing power source 06/22/16 - Controller pins re-lubricated per [employer] reps   Device: St Jude single lead Therapies: on at 200 bpm Last check: post implant   BP & Labs:  MAP 86 - Doppler is reflecting MAP  Hgb 9.0 - No S/S of bleeding. Specifically denies melena/BRBPR or nosebleeds.  LDH stable at 189 with established baseline of 190- 250. Denies tea-colored urine. No power elevations noted on interrogation.   Plan: 1. Increase  Torsemide to 80mg  BID 2. Take 3 potassium pills tonight. 3. Increase potassium to 40 mEq in the morning and 20 mEq in the evening. 4. RTC in one week  Marcellus ScottLesley Wilson RN VAD Coordinator   Office: [phone] 24/7 Emergency VAD Pager: [phone]     Advanced Heart Failure Clinic Note   PCP: Dr. Sigmund HazelLisa [name] Cardiology: Dr. Jens Somrenshaw HF Cardiology: Dr. Shirlee LatchMcLean  61 yo smoker with history of PAD, CAD, and ischemic cardiomyopathy now s/p HVAD placement presents for VAD clinic followup.  Patient was admitted in 1/18 with PNA and acute on chronic systolic CHF.  He was markedly volume overloaded and also had low cardiac output requiring dobutamine use.  He had a right pleural effusion and underwent thoracentesis.  Fluid studies suggested a parapneumonic effusion.  He had cardiac cath, showing 3 vessel disease and markedly elevated filling pressures, right and left.  He was evaluated for CABG but turned down given comorbidities and lack of venous conduits.  We were able to wean him off dobutamine and discharged him with plan for outpatient PCI once he had recovered from the current hospitalization.   Admitted 01/28/16 - 02/03/16 after presenting for elective 2 vessel PCI of the LAD and OM of the circumflex. RHC at same time showed advanced HF with low cardiac index and severely elevated pressures. Diuresed with IV lasix, metolazone, and milrinone, but refused PICC line to follow coox/CVP. Underwent thoracentesis 02/01/16 with 1.8 L out. Overall diuresed 16 lbs. Discharge weight 214 lbs.  He was admitted in 4/18 with low  output HF and severe NYHA class IV symptoms, started on milrinone.  Unable to titrate off milrinone and is now on at home.  He had a right chronic transudative effusion, PleurX catheter was placed.    He had HVAD placed 05/30/16.  He was diuresed post-op and speed was increased to 3000.  It was later dropped to 2940.  Pleurx catheter was removed.    He was admitted in 7/18 with upper GI bleed.   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  He had 3 units PRBCs.  ASA was decreased from 325 to 81 daily and warfarin goal was decreased to 2-2.5.  Octreotide has been started.   He was sent home from last hospitalization on torsemide 40 mg daily.  He has steadily gained weight on this dose.  Legs are more swollen.  He is not short of breath walking on flat ground, main limitation continues to be claudication.  He has calf claudication bilaterally after walking a moderate distance.  This is chronic and followed by [facility].  MAP 80 today.  No lightheadedness.  No BRBPR/melena.      Labs (1/18): K 4.2, creatinine 0.9, hgb 12.6 Labs (2/18): K 4.2, creatinine 1.23, hgb 12.4, BNP 321 Labs (4/18): hgbA1c 8.9, TSH elevated but free T4 normal.  K 3.2, creatinine 0.8 Labs (5/18): digoxin 0.4, K 3.4, creatinine 0.9 Labs (6/18): creatinine 0.67 => 0.98, LDH 209, hgb 8.9 => 9 Labs (7/18): K 2.9, creatinine 1.02, LDL 189, hgb 9  HVAD interrogation: See LVAD nurse's note above  PMH: 1. PAD: Left fem-pop bypass with graft in 2017.  2. CAD: Anterior MI 2005 => PCI to LAD followed by staged PCI to LCx and RCA.   - LHC (1/18): Diffuse up to 80% calcified stenosis throughout the proximal LAD.  Diffuse disease in distal LAD up to 50%.  Large OM1 totally occluded at the ostium but reconstituted via collaterals.  80% distal RCA stenosis involving the ostia of the PLV and PDA with about 70% stenosis. Patient had PCI with DES to proximal LAD and PTCA OM1 (unable to place stent).  3. Chronic systolic CHF: Ischemic cardiomyopathy.  St Jude ICD.  - Echo (1/18) with EF 20%, mildly decreased RV systolic function.  - RHC (1/18) with mean RA 21, PA 69/31 mean 45, mean PCWP 27, CI 2.37, PVR 3.3 WU.  - Repeat RHC (1/18) with mean RA 16, mean PCWP 21, CI 1.92 - Echo (3/18) with EF 20-25%, mild MR, mild RV dilation with normal systolic function, PASP 47 mmHg.  - 4/18 admission with low output HF and milrinone gtt  started (co-ox as low as 42%).  - RHC on milrinone (4/18): mean RA 5, PA 61/24 mean 40, mean PCWP 17, CI 2.1 Fick with PVR 5.1 WU, CI 1.84 thermo with PVR 5.9 WU.  - HVAD placement 05/30/16.  4. Carotid stenosis: Carotid dopplers (8/16) with 60-79% RICA, 40-59% LICA.   - Carotid dopplers (2/18) with 40-59% RICA stenosis.  5. Type II diabetes. 6. Hyperlipidemia. 7. HTN 8. COPD:  PFTs (5/18) with moderate to severe obstructive disease.  Active smoker. 9. Chronic right pleural effusion (transudative) with PleurX catheter.  10. Upper GI bleeding: 7/18, enteroscopy with 2 small bowel AVMs (treated).   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  He has quit smoking.  Occasional ETOH.   Family History  Problem Relation Age of Onset  . Diabetes Mother   . Coronary artery disease Father   .  Heart disease Father        before age 44   ROS: All systems reviewed and negative except as per HPI.   Current Outpatient Prescriptions  Medication Sig Dispense Refill  . aspirin EC 81 MG EC tablet Take 1 tablet (81 mg total) by mouth daily. 30 tablet 6  . atorvastatin (LIPITOR) 40 MG tablet Take 1 tablet (40 mg total) by mouth daily. 90 tablet 3  . docusate sodium (COLACE) 100 MG capsule Take 1 capsule (100 mg total) by mouth 2 (two) times daily. 10 capsule 0  . ezetimibe (ZETIA) 10 MG tablet Take 1 tablet (10 mg total) by mouth daily. 90 tablet 3  . ferrous fumarate-b12-vitamic C-folic acid (TRINSICON / FOLTRIN) capsule Take 1 capsule by mouth 2 (two) times daily between meals. 60 capsule 6  . gabapentin (NEURONTIN) 100 MG capsule Take 1 capsule (100 mg total) by mouth every 12 (twelve) hours as needed. 60 capsule 6  . insulin NPH-regular Human (NOVOLIN 70/30) (70-30) 100 UNIT/ML injection Inject 15-30 Units into the skin See admin instructions. 30 units in the morning, 15-20 units in the afternoon, 15-20 units in the evening    . losartan (COZAAR) 25 MG tablet Take 1 tablet (25 mg total) by  mouth 2 (two) times daily. 60 tablet 6  . pantoprazole (PROTONIX) 40 MG tablet Take 1 tablet (40 mg total) by mouth 2 (two) times daily. 60 tablet 6  . potassium chloride SA (K-DUR,KLOR-CON) 20 MEQ tablet Take 40 mEq in the morning and 20 mEq in the evening. 90 tablet 3  . sildenafil (REVATIO) 20 MG tablet Take 1 tablet (20 mg total) by mouth 3 (three) times daily. 90 tablet 11  . torsemide (DEMADEX) 20 MG tablet Take 4 tablets (80 mg total) by mouth 2 (two) times daily. 180 tablet 3  . warfarin (COUMADIN) 5 MG tablet Take 20 mg  7/19 90 tablet 6   Current Facility-Administered Medications  Medication Dose Route Frequency Provider Last Rate Last Dose  . octreotide (SANDOSTATIN LAR) IM injection 20 mg  20 mg Intramuscular Q28 days Laurey Morale, MD   20 mg at 07/21/16 1041   BP 105/75   Pulse 86   Resp 16   Ht 6' (1.829 m)   Wt 233 lb 6.4 oz (105.9 kg)   SpO2 98%   BMI 31.65 kg/m   MAP 88  Wt Readings from Last 3 Encounters:  07/28/16 233 lb 6.4 oz (105.9 kg)  07/19/16 231 lb 9.6 oz (105.1 kg)  07/07/16 230 lb 3.2 oz (104.4 kg)    General: NAD Neck: JVP 12-14 cm, no thyromegaly or thyroid nodule.  Lungs: Clear to auscultation bilaterally with normal respiratory effort. CV: LVAD hum.  2+ edema to knees.  No carotid bruit.  Unable to palpate pedal pulses.  Abdomen: Soft, nontender, no hepatosplenomegaly, no distention.  Skin: Venous stasis changes lower legs.  Driveline site benign.  Neurologic: Alert and oriented x 3.  Psych: Normal affect. Extremities: No clubbing or cyanosis.  HEENT: Normal.   Assessment/Plan: 1. Chronic systolic CHF: Ischemic cardiomyopathy.  Echo in 3/18 with EF 20-25%.  He did not have much effect from 12/17 PCI in terms of symptoms or EF.  St Jude ICD.  He is now s/p HVAD placement.  HVAD parameters are stable. Lavare cycle is on.  He is volume overloaded, ended up leaving hospital after last admission on too low a dose of torsemide.  MAP is controlled,  80 today.  -  Increase torsemide to 80 mg bid and add KCl 40 qam/20 qpm.  - Continue losartan 25 mg bid.  - Needs to do cardiac rehab.  - He is going to think about whether he would eventually want to have transplant evaluation.  If he does, will need to check nicotine levels.  2. CAD: 3 vessel CAD, s/p stent to proximal LAD and PTCA to subtotally occluded large OM1 in 1/18. No change in symptoms or EF with intervention.  - He was on Plavix for > 3 months post-intervention, now on warfarin + ASA.  - Continue atorvastatin 80 mg daily.   3. PAD: Stable claudication, notes with moderate walking.   - He follows with [facility], has appointment soon. - I encouraged him to try walking through the pain for at least a short distance.   4. Carotid stenosis:  Followed at [facility].  5. R Pleural effusion: PleurX catheter removed prior to last discharge.  6. Pulmonary arterial HTN: Continue Revatio 20 mg tid.  7. Smoking: He has quit.   8. Upper GI bleeding: 7/18, small bowel AVMs.  ASA was decreased to 81 daily.  Warfarin INR goal is now 2-2.5.  He has started monthly octreotide injections.   Marca Ancona 07/30/2016

## 2016-08-02 ENCOUNTER — Other Ambulatory Visit (HOSPITAL_COMMUNITY): Payer: Self-pay | Admitting: *Deleted

## 2016-08-02 DIAGNOSIS — Z95811 Presence of heart assist device: Secondary | ICD-10-CM

## 2016-08-04 ENCOUNTER — Other Ambulatory Visit: Payer: Self-pay | Admitting: Pharmacist

## 2016-08-04 ENCOUNTER — Ambulatory Visit (HOSPITAL_COMMUNITY): Payer: Self-pay | Admitting: Pharmacist

## 2016-08-04 ENCOUNTER — Ambulatory Visit (HOSPITAL_COMMUNITY)
Admission: RE | Admit: 2016-08-04 | Discharge: 2016-08-04 | Disposition: A | Discharge status: Home / Self Care | Payer: BLUE CROSS/BLUE SHIELD | Source: Ambulatory Visit | Attending: Internal Medicine | Admitting: Internal Medicine

## 2016-08-04 ENCOUNTER — Encounter (HOSPITAL_COMMUNITY): Payer: Self-pay

## 2016-08-04 ENCOUNTER — Other Ambulatory Visit (HOSPITAL_COMMUNITY): Payer: Self-pay

## 2016-08-04 ENCOUNTER — Other Ambulatory Visit (HOSPITAL_COMMUNITY): Payer: Self-pay | Admitting: Pharmacist

## 2016-08-04 ENCOUNTER — Telehealth (HOSPITAL_COMMUNITY): Payer: Self-pay | Admitting: Cardiology

## 2016-08-04 VITALS — BP 105/72 | HR 84 | Resp 16 | Ht 72.0 in | Wt 234.6 lb

## 2016-08-04 DIAGNOSIS — Z833 Family history of diabetes mellitus: Secondary | ICD-10-CM | POA: Insufficient documentation

## 2016-08-04 DIAGNOSIS — Z8249 Family history of ischemic heart disease and other diseases of the circulatory system: Secondary | ICD-10-CM | POA: Diagnosis not present

## 2016-08-04 DIAGNOSIS — I252 Old myocardial infarction: Secondary | ICD-10-CM | POA: Insufficient documentation

## 2016-08-04 DIAGNOSIS — I5022 Chronic systolic (congestive) heart failure: Secondary | ICD-10-CM

## 2016-08-04 DIAGNOSIS — I493 Ventricular premature depolarization: Secondary | ICD-10-CM | POA: Diagnosis not present

## 2016-08-04 DIAGNOSIS — Z7982 Long term (current) use of aspirin: Secondary | ICD-10-CM | POA: Insufficient documentation

## 2016-08-04 DIAGNOSIS — Z95810 Presence of automatic (implantable) cardiac defibrillator: Secondary | ICD-10-CM

## 2016-08-04 DIAGNOSIS — I2721 Secondary pulmonary arterial hypertension: Secondary | ICD-10-CM | POA: Insufficient documentation

## 2016-08-04 DIAGNOSIS — K5520 Angiodysplasia of colon without hemorrhage: Secondary | ICD-10-CM

## 2016-08-04 DIAGNOSIS — Z955 Presence of coronary angioplasty implant and graft: Secondary | ICD-10-CM | POA: Insufficient documentation

## 2016-08-04 DIAGNOSIS — E785 Hyperlipidemia, unspecified: Secondary | ICD-10-CM | POA: Diagnosis not present

## 2016-08-04 DIAGNOSIS — E1151 Type 2 diabetes mellitus with diabetic peripheral angiopathy without gangrene: Secondary | ICD-10-CM | POA: Diagnosis not present

## 2016-08-04 DIAGNOSIS — I110 Hypertensive heart disease with heart failure: Secondary | ICD-10-CM | POA: Insufficient documentation

## 2016-08-04 DIAGNOSIS — J440 Chronic obstructive pulmonary disease with acute lower respiratory infection: Secondary | ICD-10-CM | POA: Insufficient documentation

## 2016-08-04 DIAGNOSIS — I255 Ischemic cardiomyopathy: Secondary | ICD-10-CM | POA: Diagnosis not present

## 2016-08-04 DIAGNOSIS — I6523 Occlusion and stenosis of bilateral carotid arteries: Secondary | ICD-10-CM | POA: Insufficient documentation

## 2016-08-04 DIAGNOSIS — Z87891 Personal history of nicotine dependence: Secondary | ICD-10-CM | POA: Insufficient documentation

## 2016-08-04 DIAGNOSIS — Z7902 Long term (current) use of antithrombotics/antiplatelets: Secondary | ICD-10-CM | POA: Diagnosis not present

## 2016-08-04 DIAGNOSIS — Z95811 Presence of heart assist device: Secondary | ICD-10-CM | POA: Diagnosis not present

## 2016-08-04 DIAGNOSIS — Z794 Long term (current) use of insulin: Secondary | ICD-10-CM | POA: Insufficient documentation

## 2016-08-04 DIAGNOSIS — I2510 Atherosclerotic heart disease of native coronary artery without angina pectoris: Secondary | ICD-10-CM | POA: Insufficient documentation

## 2016-08-04 LAB — CBC
HEMATOCRIT: 32.4 % — AB (ref 39.0–52.0)
HEMOGLOBIN: 10 g/dL — AB (ref 13.0–17.0)
MCH: 28.3 pg (ref 26.0–34.0)
MCHC: 30.9 g/dL (ref 30.0–36.0)
MCV: 91.8 fL (ref 78.0–100.0)
Platelets: 191 10*3/uL (ref 150–400)
RBC: 3.53 MIL/uL — AB (ref 4.22–5.81)
RDW: 17.4 % — ABNORMAL HIGH (ref 11.5–15.5)
WBC: 8.3 10*3/uL (ref 4.0–10.5)

## 2016-08-04 LAB — BASIC METABOLIC PANEL
ANION GAP: 10 (ref 5–15)
BUN: 9 mg/dL (ref 6–20)
CHLORIDE: 102 mmol/L (ref 101–111)
CO2: 29 mmol/L (ref 22–32)
CREATININE: 0.9 mg/dL (ref 0.61–1.24)
Calcium: 8.7 mg/dL — ABNORMAL LOW (ref 8.9–10.3)
GFR calc non Af Amer: >60 mL/min (ref >60)
Glucose, Bld: 155 mg/dL — ABNORMAL HIGH (ref 65–99)
POTASSIUM: 3.2 mmol/L — AB (ref 3.5–5.1)
SODIUM: 141 mmol/L (ref 135–145)

## 2016-08-04 LAB — PROTIME-INR
INR: 3.01
PROTHROMBIN TIME: 31.9 s — AB (ref 11.4–15.2)

## 2016-08-04 LAB — LACTATE DEHYDROGENASE: LDH: 204 U/L — AB (ref 98–192)

## 2016-08-04 MED ORDER — WARFARIN SODIUM 5 MG PO TABS: ORAL_TABLET | ORAL | 6 refills | Status: DC

## 2016-08-04 MED ORDER — WARFARIN SODIUM 5 MG PO TABS: 10.0000 mg | ORAL_TABLET | Freq: Every day | ORAL | 5 refills | Status: DC

## 2016-08-04 MED ORDER — POTASSIUM CHLORIDE CRYS ER 20 MEQ PO TBCR: 40.0000 meq | EXTENDED_RELEASE_TABLET | Freq: Two times a day (BID) | ORAL | 3 refills | Status: DC

## 2016-08-04 MED ORDER — BLOOD GLUCOSE METER KIT: PACK | 0 refills | Status: AC

## 2016-08-04 MED ORDER — ONETOUCH VERIO W/DEVICE KIT: 1.0000 | PACK | 0 refills | Status: DC

## 2016-08-04 NOTE — Telephone Encounter (Signed)
Message left form pharmacy stating that RX sent was incorrect. Meter brand was wrong and will not match his strips.  Verbal order given for One touch ultra blue meter and accessories

## 2016-08-04 NOTE — Patient Instructions (Signed)
Return to clinic in 2 weeks  Support group is the 3rd Monday of each month in the clinic. It starts at 5 pm.  Increase potassium to 40 meq twice a day.  We will decrease Octretide to half-dose.

## 2016-08-04 NOTE — Progress Notes (Addendum)
Patient presents for 1 week  follow up in [facility] today. Reports no problems with VAD equipment or concerns with drive line. States he has severe diarrhea since starting his Octreotide injections. EKG obtained at this visit after placing pt on HVAD monitor to verify rhythm.   Vital Signs:  Doppler Pressure 105   Automatc BP: 105/72 (85) HR:84   SPO2:100  %  Weight: 234.6  lb w/o eq Last weight: 233 lb Home weights: 233 lbs   VAD Indication: Destination Therapy- smoker   VAD interrogation & Equipment Management (reviewed with Dr. Aundra Dubin):  Speed: 2840 Flow: 5.4 Power:5.3 w Peak/Trough: 7/3.6 HCT- adjusted to 32% today Alarm settings: 3.5/7 Suction: on Lavare: On  Alarms: no clinical alarms Events: none   Exit Site Care: Drive line is being maintained every 3 days  by [employer]. Drive line exit site well healed and incorporated. The velour is fully implanted at exit site. Dressing dry and intact. No erythema or drainage. Stabilization device present and accurately applied. Pt denies fever or chills. Pt states they have adequate dressing supplies at home.    Significant Events on VAD Support:  05/31/16 - Controller power-up associated with pump start event. Controller change out performed.  06/01/16 - Controller pins lubricated per St Jude reps  06/17/16 - Controller power-up associated with pump start event while changing power source 06/22/16 - Controller pins re-lubricated per St Jude reps   Device:St Jude single lead- followed at device clinic Therapies: on at 200 bpm Last check: post implant   Patient completed 1200 feet during 6 minute walk test. Tolerated well.   Neurocognitive trail making completed correctly in 1.22 minutes.   [address] Cardiomyopathy, EQ-5D-3L and post-VAD QOL completed by the patient independently.   BP & Labs:  MAP105 - Doppler is reflecting modified systolic  Hgb 02.5 - No S/S of bleeding. Specifically denies melena/BRBPR  or nosebleeds.  LDH stable at 204 with established baseline of 190- 250. Denies tea-colored urine. No power elevations noted on interrogation.   Plan: 1. Increase potassium to 40 mEq BID 2. Prescription sent to [facility] for new glucose meter 3. RTC in 1 week for dressing check/inr 4. RTC in 2 weeks for VAD visit 5. Decrease Octreotide to half-dose. Discussed with pharmacy.  Balinda Quails RN VAD Coordinator   Office: [phone] 24/7 Emergency VAD Pager: [phone]     Advanced Heart Failure Clinic Note   PCP: Dr. Kathyrn Lass Cardiology: Dr. Stanford Breed HF Cardiology: Dr. Aundra Dubin  61 yo smoker with history of PAD, CAD, and ischemic cardiomyopathy now s/p HVAD placement presents for VAD clinic followup.  Patient was admitted in 1/18 with PNA and acute on chronic systolic CHF.  He was markedly volume overloaded and also had low cardiac output requiring dobutamine use.  He had a right pleural effusion and underwent thoracentesis.  Fluid studies suggested a parapneumonic effusion.  He had cardiac cath, showing 3 vessel disease and markedly elevated filling pressures, right and left.  He was evaluated for CABG but turned down given comorbidities and lack of venous conduits.  We were able to wean him off dobutamine and discharged him with plan for outpatient PCI once he had recovered from the current hospitalization.   Admitted 01/28/16 - 02/03/16 after presenting for elective 2 vessel PCI of the LAD and OM of the circumflex. RHC at same time showed advanced HF with low cardiac index and severely elevated pressures. Diuresed with IV lasix, metolazone, and milrinone, but refused PICC line to  follow coox/CVP. Underwent thoracentesis 02/01/16 with 1.8 L out. Overall diuresed 16 lbs. Discharge weight 214 lbs.  He was admitted in 4/18 with low output HF and severe NYHA class IV symptoms, started on milrinone.  Unable to titrate off milrinone and is now on at home.  He had a right chronic transudative  effusion, PleurX catheter was placed.    He had HVAD placed 05/30/16.  He was diuresed post-op and speed was increased to 3000.  It was later dropped to 2940.  Pleurx catheter was removed.    He was admitted in 7/18 with upper GI bleed.  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  He had 3 units PRBCs.  ASA was decreased from 325 to 81 daily and warfarin goal was decreased to 2-2.5.  Octreotide has been started.   He has calf claudication bilaterally after walking a moderate distance.  This is chronic and followed by [facility].    After 1st octreotide dose, he has developed profuse diarrhea, this has continued for the 3 weeks since the dose.  He has had multiple episodes watery diarrhea a day.  MAP stable today at 85. He denies exertional dyspnea, still has claudication. Weight stable. Decreased leg edema on higher torsemide.    ECG (personally reviewed): NSR, IVCD, PVCs    Labs (1/18): K 4.2, creatinine 0.9, hgb 12.6 Labs (2/18): K 4.2, creatinine 1.23, hgb 12.4, BNP 321 Labs (4/18): hgbA1c 8.9, TSH elevated but free T4 normal.  K 3.2, creatinine 0.8 Labs (5/18): digoxin 0.4, K 3.4, creatinine 0.9 Labs (6/18): creatinine 0.67 => 0.98, LDH 209, hgb 8.9 => 9 Labs (7/18): K 2.9, creatinine 1.02, LDL 189, hgb 9 Labs (8/18): hgb 10, LDH 204, K 3.2, creatinine 0.9  HVAD interrogation: See LVAD nurse's note above  PMH: 1. PAD: Left fem-pop bypass with graft in 2017.  2. CAD: Anterior MI 2005 => PCI to LAD followed by staged PCI to LCx and RCA.   - LHC (1/18): Diffuse up to 80% calcified stenosis throughout the proximal LAD.  Diffuse disease in distal LAD up to 50%.  Large OM1 totally occluded at the ostium but reconstituted via collaterals.  80% distal RCA stenosis involving the ostia of the PLV and PDA with about 70% stenosis. Patient had PCI with DES to proximal LAD and PTCA OM1 (unable to place stent).  3. Chronic systolic CHF: Ischemic cardiomyopathy.  St Jude ICD.  - Echo (1/18)  with EF 20%, mildly decreased RV systolic function.  - RHC (1/18) with mean RA 21, PA 69/31 mean 45, mean PCWP 27, CI 2.37, PVR 3.3 WU.  - Repeat RHC (1/18) with mean RA 16, mean PCWP 21, CI 1.92 - Echo (3/18) with EF 20-25%, mild MR, mild RV dilation with normal systolic function, PASP 47 mmHg.  - 4/18 admission with low output HF and milrinone gtt started (co-ox as low as 42%).  - RHC on milrinone (4/18): mean RA 5, PA 61/24 mean 40, mean PCWP 17, CI 2.1 Fick with PVR 5.1 WU, CI 1.84 thermo with PVR 5.9 WU.  - HVAD placement 05/30/16.  4. Carotid stenosis: Carotid dopplers (8/16) with 37-34% RICA, 28-76% LICA.   - Carotid dopplers (2/18) with 40-59% RICA stenosis.  5. Type II diabetes. 6. Hyperlipidemia. 7. HTN 8. COPD:  PFTs (5/18) with moderate to severe obstructive disease.  Active smoker. 9. Chronic right pleural effusion (transudative) with PleurX catheter.  10. Upper GI bleeding: 7/18, enteroscopy with 2 small bowel AVMs (treated).  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  He has quit smoking.  Occasional ETOH.   Family History  Problem Relation Age of Onset  . Diabetes Mother   . Coronary artery disease Father   . Heart disease Father        before age 32   ROS: All systems reviewed and negative except as per HPI.   Current Outpatient Prescriptions  Medication Sig Dispense Refill  . aspirin EC 81 MG EC tablet Take 1 tablet (81 mg total) by mouth daily. 30 tablet 6  . atorvastatin (LIPITOR) 40 MG tablet Take 1 tablet (40 mg total) by mouth daily. 90 tablet 3  . ezetimibe (ZETIA) 10 MG tablet Take 1 tablet (10 mg total) by mouth daily. 90 tablet 3  . ferrous [id number] C-folic acid (TRINSICON / FOLTRIN) capsule Take 1 capsule by mouth 2 (two) times daily between meals. 60 capsule 6  . gabapentin (NEURONTIN) 100 MG capsule Take 1 capsule (100 mg total) by mouth every 12 (twelve) hours as needed. 60 capsule 6  . insulin NPH-regular Human (NOVOLIN  70/30) (70-30) 100 UNIT/ML injection Inject 15-30 Units into the skin See admin instructions. 30 units in the morning, 15-20 units in the afternoon, 15-20 units in the evening    . losartan (COZAAR) 25 MG tablet Take 1 tablet (25 mg total) by mouth 2 (two) times daily. 60 tablet 6  . pantoprazole (PROTONIX) 40 MG tablet Take 1 tablet (40 mg total) by mouth 2 (two) times daily. 60 tablet 6  . potassium chloride SA (K-DUR,KLOR-CON) 20 MEQ tablet Take 2 tablets (40 mEq total) by mouth 2 (two) times daily. 90 tablet 3  . sildenafil (REVATIO) 20 MG tablet Take 1 tablet (20 mg total) by mouth 3 (three) times daily. 90 tablet 11  . torsemide (DEMADEX) 20 MG tablet Take 4 tablets (80 mg total) by mouth 2 (two) times daily. 180 tablet 3  . blood glucose meter kit and supplies Ultra Blue Kit Dispense based on patient and insurance preference. Use up to four times daily as directed. (FOR ICD-9 250.00, 250.01). 1 each 0  . docusate sodium (COLACE) 100 MG capsule Take 1 capsule (100 mg total) by mouth 2 (two) times daily. (Patient not taking: Reported on 08/04/2016) 10 capsule 0  . warfarin (COUMADIN) 5 MG tablet Take 2 tablets (10 mg total) by mouth daily. 60 tablet 5   Current Facility-Administered Medications  Medication Dose Route Frequency Provider Last Rate Last Dose  . octreotide (SANDOSTATIN LAR) IM injection 20 mg  20 mg Intramuscular Q28 days Larey Dresser, MD   20 mg at 07/21/16 1041   BP 105/72   Pulse 84   Resp 16   Ht 6' (1.829 m)   Wt 234 lb 9.6 oz (106.4 kg)   SpO2 100%   BMI 31.82 kg/m   MAP 85  Wt Readings from Last 3 Encounters:  08/04/16 234 lb 9.6 oz (106.4 kg)  07/28/16 233 lb 6.4 oz (105.9 kg)  07/19/16 231 lb 9.6 oz (105.1 kg)  General: NAD Neck: JVP 9-10 cm, no thyromegaly or thyroid nodule.  Lungs: Clear to auscultation bilaterally with normal respiratory effort. CV: LVAD hum.  1+ edema to knees.  No carotid bruit.  Unable to palpate pedal pulses.  Abdomen: Soft,  nontender, no hepatosplenomegaly, no distention.  Skin: Venous stasis changes lower legs.  Driveline site benign.  Neurologic: Alert and oriented x 3.  Psych: Normal affect. Extremities: No  clubbing or cyanosis.  HEENT: Normal.    Assessment/Plan: 1. Chronic systolic CHF: Ischemic cardiomyopathy.  Echo in 3/18 with EF 20-25%.  He did not have much effect from 12/17 PCI in terms of symptoms or EF.  St Jude ICD.  He is now s/p HVAD placement.  HVAD parameters are stable. Lavare cycle is on.  Mild volume overload but looks better than at last appointment.  Suspect he has a degree of RV failure and we will not get his CVP down to normal range.   - Continue torsemide 80 mg bid and increase KCl to 40 bid.    - Continue losartan 25 mg bid.  - Needs to do cardiac rehab.  - He is going to think about whether he would eventually want to have transplant evaluation.  If he does, will need to check nicotine levels.  2. CAD: 3 vessel CAD, s/p stent to proximal LAD and PTCA to subtotally occluded large OM1 in 1/18. No change in symptoms or EF with intervention.  - He was on Plavix for > 3 months post-intervention, now on warfarin + ASA.  - Continue atorvastatin 80 mg daily.   3. PAD: Stable claudication, notes with moderate walking.   - He follows with [facility], has appointment soon. - I encouraged him to try walking through the pain for at least a short distance.   4. Carotid stenosis:  Followed at [facility].  5. R Pleural effusion: PleurX catheter removed prior to last discharge.  6. Pulmonary arterial HTN: Continue Revatio 20 mg tid.  7. Smoking: He has quit.   8. Upper GI bleeding: 7/18, small bowel AVMs.  ASA was decreased to 81 daily.  Warfarin INR goal is now 2-2.5.  He has started monthly octreotide injections but had profuse diarrhea with octreotide after 1st injection.  Will cut 2nd injection down to 10 mg.  If this does not resolve diarrhea, may have to stop.   Loralie Champagne 08/06/2016

## 2016-08-06 NOTE — Addendum Note (Signed)
Encounter addended by: Laurey MoraleMcLean, Kasson Lamere S, MD on: 08/06/2016 10:08 PM<BR>    Actions taken: LOS modified

## 2016-08-08 ENCOUNTER — Other Ambulatory Visit (HOSPITAL_COMMUNITY): Payer: Self-pay | Admitting: Unknown Physician Specialty

## 2016-08-08 DIAGNOSIS — Z95811 Presence of heart assist device: Secondary | ICD-10-CM

## 2016-08-08 DIAGNOSIS — Z7901 Long term (current) use of anticoagulants: Secondary | ICD-10-CM

## 2016-08-10 ENCOUNTER — Encounter (HOSPITAL_COMMUNITY): Payer: Self-pay

## 2016-08-10 ENCOUNTER — Emergency Department (HOSPITAL_COMMUNITY): Payer: BLUE CROSS/BLUE SHIELD

## 2016-08-10 ENCOUNTER — Emergency Department (HOSPITAL_COMMUNITY)
Admission: EM | Admit: 2016-08-10 | Discharge: 2016-08-10 | Disposition: A | Discharge status: Home / Self Care | Payer: BLUE CROSS/BLUE SHIELD | Attending: Emergency Medicine | Admitting: Emergency Medicine

## 2016-08-10 DIAGNOSIS — Y998 Other external cause status: Secondary | ICD-10-CM | POA: Diagnosis not present

## 2016-08-10 DIAGNOSIS — Y92410 Unspecified street and highway as the place of occurrence of the external cause: Secondary | ICD-10-CM | POA: Insufficient documentation

## 2016-08-10 DIAGNOSIS — Y939 Activity, unspecified: Secondary | ICD-10-CM | POA: Diagnosis not present

## 2016-08-10 DIAGNOSIS — I5022 Chronic systolic (congestive) heart failure: Secondary | ICD-10-CM | POA: Insufficient documentation

## 2016-08-10 DIAGNOSIS — Z7982 Long term (current) use of aspirin: Secondary | ICD-10-CM | POA: Diagnosis not present

## 2016-08-10 DIAGNOSIS — E119 Type 2 diabetes mellitus without complications: Secondary | ICD-10-CM | POA: Diagnosis not present

## 2016-08-10 DIAGNOSIS — E162 Hypoglycemia, unspecified: Secondary | ICD-10-CM | POA: Diagnosis not present

## 2016-08-10 DIAGNOSIS — I110 Hypertensive heart disease with heart failure: Secondary | ICD-10-CM | POA: Diagnosis not present

## 2016-08-10 DIAGNOSIS — I2510 Atherosclerotic heart disease of native coronary artery without angina pectoris: Secondary | ICD-10-CM | POA: Diagnosis not present

## 2016-08-10 DIAGNOSIS — Z794 Long term (current) use of insulin: Secondary | ICD-10-CM | POA: Insufficient documentation

## 2016-08-10 DIAGNOSIS — Z7901 Long term (current) use of anticoagulants: Secondary | ICD-10-CM | POA: Insufficient documentation

## 2016-08-10 DIAGNOSIS — F17210 Nicotine dependence, cigarettes, uncomplicated: Secondary | ICD-10-CM | POA: Insufficient documentation

## 2016-08-10 DIAGNOSIS — Z79899 Other long term (current) drug therapy: Secondary | ICD-10-CM | POA: Insufficient documentation

## 2016-08-10 LAB — CBC WITH DIFFERENTIAL/PLATELET
BASOS ABS: 0 10*3/uL (ref 0.0–0.1)
BASOS PCT: 0 %
EOS ABS: 0.1 10*3/uL (ref 0.0–0.7)
Eosinophils Relative: 1 %
HEMATOCRIT: 32.3 % — AB (ref 39.0–52.0)
HEMOGLOBIN: 10.2 g/dL — AB (ref 13.0–17.0)
Lymphocytes Relative: 6 %
Lymphs Abs: 0.7 10*3/uL (ref 0.7–4.0)
MCH: 28.3 pg (ref 26.0–34.0)
MCHC: 31.6 g/dL (ref 30.0–36.0)
MCV: 89.5 fL (ref 78.0–100.0)
MONOS PCT: 7 %
Monocytes Absolute: 0.7 10*3/uL (ref 0.1–1.0)
NEUTROS ABS: 9.3 10*3/uL — AB (ref 1.7–7.7)
NEUTROS PCT: 86 %
Platelets: 162 10*3/uL (ref 150–400)
RBC: 3.61 MIL/uL — AB (ref 4.22–5.81)
RDW: 17.2 % — ABNORMAL HIGH (ref 11.5–15.5)
WBC: 10.8 10*3/uL — AB (ref 4.0–10.5)

## 2016-08-10 LAB — COMPREHENSIVE METABOLIC PANEL
ALBUMIN: 3.5 g/dL (ref 3.5–5.0)
ALK PHOS: 119 U/L (ref 38–126)
ALT: 20 U/L (ref 17–63)
ANION GAP: 10 (ref 5–15)
AST: 22 U/L (ref 15–41)
BILIRUBIN TOTAL: 0.7 mg/dL (ref 0.3–1.2)
BUN: 11 mg/dL (ref 6–20)
CALCIUM: 8.7 mg/dL — AB (ref 8.9–10.3)
CO2: 27 mmol/L (ref 22–32)
Chloride: 100 mmol/L — ABNORMAL LOW (ref 101–111)
Creatinine, Ser: 1.04 mg/dL (ref 0.61–1.24)
GFR calc Af Amer: >60 mL/min (ref >60)
GFR calc non Af Amer: >60 mL/min (ref >60)
GLUCOSE: 61 mg/dL — AB (ref 65–99)
Potassium: 3.3 mmol/L — ABNORMAL LOW (ref 3.5–5.1)
SODIUM: 137 mmol/L (ref 135–145)
TOTAL PROTEIN: 8 g/dL (ref 6.5–8.1)

## 2016-08-10 LAB — CBG MONITORING, ED
GLUCOSE-CAPILLARY: 93 mg/dL (ref 65–99)
GLUCOSE-CAPILLARY: 134 mg/dL — AB (ref 65–99)
Glucose-Capillary: 200 mg/dL — ABNORMAL HIGH (ref 65–99)

## 2016-08-10 LAB — PROTIME-INR
INR: 2.97
Prothrombin Time: 31.6 seconds — ABNORMAL HIGH (ref 11.4–15.2)

## 2016-08-10 NOTE — ED Notes (Addendum)
Pt changed his (2) battery, new battery is fully charged. Battery (1) is also fully charged.

## 2016-08-10 NOTE — ED Notes (Signed)
Spoke with LVAD coordinator about pt still being hooked to personal LVAD machine. [name] reports she will call rapid response to bring machine over and make Dr. Gala RomneyBensimhon aware of pt status

## 2016-08-10 NOTE — ED Notes (Signed)
Rapid response at bedside.

## 2016-08-10 NOTE — ED Notes (Signed)
VAD cart at bedside. PT requesting to stay on his on pump at this time
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
## 2016-08-10 NOTE — ED Notes (Signed)
Pt given food and encouraged to eat

## 2016-08-10 NOTE — ED Notes (Signed)
CBG 93 
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
## 2016-08-10 NOTE — Discharge Instructions (Signed)
Take your current meds.   Take tylenol for pain.   Check your sugar frequently. Eat normally.   See your cardiologist and primary care doctor   Return to ER if you have chest pain, passing out, headache, vomiting.

## 2016-08-10 NOTE — ED Notes (Addendum)
Patient transported to X-ray with this RN 

## 2016-08-10 NOTE — ED Notes (Signed)
This RN checked pts LVAD  RPM 2840,  5.6 watts, flow 5.5-5.7 L/Min Low flow alarm 3.5 L/min High power alarm 7.0 watts Hematocrit 32% Peak 07.8 Trough 03.4  Line is intact batteries are both over halfway charged, pt has emergency back up bag and extra batteries,

## 2016-08-11 ENCOUNTER — Ambulatory Visit (HOSPITAL_COMMUNITY): Payer: Self-pay | Admitting: Pharmacist

## 2016-08-11 ENCOUNTER — Ambulatory Visit (HOSPITAL_COMMUNITY)
Admission: RE | Admit: 2016-08-11 | Discharge: 2016-08-11 | Disposition: A | Discharge status: Home / Self Care | Payer: BLUE CROSS/BLUE SHIELD | Source: Ambulatory Visit | Attending: Internal Medicine | Admitting: Internal Medicine

## 2016-08-11 ENCOUNTER — Telehealth (HOSPITAL_COMMUNITY): Payer: Self-pay | Admitting: *Deleted

## 2016-08-11 DIAGNOSIS — Z95811 Presence of heart assist device: Secondary | ICD-10-CM

## 2016-08-11 DIAGNOSIS — Z4801 Encounter for change or removal of surgical wound dressing: Secondary | ICD-10-CM | POA: Diagnosis not present

## 2016-08-11 NOTE — Progress Notes (Signed)
Presented to patients room today for drive line exit wound care. Existing VAD dressing removed and site care performed using sterile technique. Drive line exit site cleaned with Chlora prep applicators x 2, allowed to dry, and daily gauze dressing with silver strip re-applied. Exit site healed and incorporated, the velour is fully implanted at exit site. No redness, tenderness, drainage, foul odor or rash noted. Drive line anchor re-applied. Pt denies fever or chills. Will continue with every other day dressing changes at home.  Marcellus ScottLesley Wilson RN, VAD Coordinator 24/7 pager [phone]
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
## 2016-08-16 ENCOUNTER — Other Ambulatory Visit (HOSPITAL_COMMUNITY): Payer: Self-pay | Admitting: *Deleted

## 2016-08-16 DIAGNOSIS — Z95811 Presence of heart assist device: Secondary | ICD-10-CM

## 2016-08-17 ENCOUNTER — Encounter (HOSPITAL_COMMUNITY): Payer: Self-pay

## 2016-08-17 ENCOUNTER — Ambulatory Visit (HOSPITAL_COMMUNITY)
Admission: RE | Admit: 2016-08-17 | Discharge: 2016-08-17 | Disposition: A | Discharge status: Home / Self Care | Payer: BLUE CROSS/BLUE SHIELD | Source: Ambulatory Visit | Attending: Internal Medicine | Admitting: Internal Medicine

## 2016-08-17 ENCOUNTER — Ambulatory Visit (INDEPENDENT_AMBULATORY_CARE_PROVIDER_SITE_OTHER): Payer: BLUE CROSS/BLUE SHIELD | Admitting: *Deleted

## 2016-08-17 ENCOUNTER — Ambulatory Visit (HOSPITAL_COMMUNITY): Payer: Self-pay | Admitting: Pharmacist

## 2016-08-17 ENCOUNTER — Encounter (HOSPITAL_COMMUNITY)
Admission: RE | Admit: 2016-08-17 | Discharge: 2016-08-17 | Disposition: A | Discharge status: Home / Self Care | Payer: BLUE CROSS/BLUE SHIELD | Source: Ambulatory Visit | Attending: Internal Medicine | Admitting: Internal Medicine

## 2016-08-17 DIAGNOSIS — I5022 Chronic systolic (congestive) heart failure: Secondary | ICD-10-CM

## 2016-08-17 DIAGNOSIS — I255 Ischemic cardiomyopathy: Secondary | ICD-10-CM | POA: Diagnosis not present

## 2016-08-17 DIAGNOSIS — K922 Gastrointestinal hemorrhage, unspecified: Secondary | ICD-10-CM | POA: Insufficient documentation

## 2016-08-17 DIAGNOSIS — Z95811 Presence of heart assist device: Secondary | ICD-10-CM

## 2016-08-17 DIAGNOSIS — K5520 Angiodysplasia of colon without hemorrhage: Secondary | ICD-10-CM

## 2016-08-17 LAB — PROTIME-INR
INR: 2.45
PROTHROMBIN TIME: 27 s — AB (ref 11.4–15.2)

## 2016-08-17 MED ORDER — OCTREOTIDE ACETATE 20 MG IM KIT
10.0000 mg | PACK | INTRAMUSCULAR | Status: DC
  Administered 2016-08-17: 10 mg via INTRAMUSCULAR
  Filled 2016-08-17: qty 1

## 2016-08-17 MED ORDER — OCTREOTIDE ACETATE 20 MG IM KIT: 10.0000 mg | PACK | INTRAMUSCULAR | Status: DC

## 2016-08-17 NOTE — Addendum Note (Signed)
Encounter addended by: Thayer HeadingsWilson, Lesley K, RN on: 08/17/2016 12:04 PM<BR>    Actions taken: Order Reconciliation Section accessed

## 2016-08-17 NOTE — Progress Notes (Signed)
CSW met with patient in the clinic per his request. Patient states he was told by long term disability company that he should apply for SSD. Patient asking about process and application. CSW discussed at length and patient plans to visit [place] office to start application process for SSD. Raquel Sarna, [place], [place]

## 2016-08-17 NOTE — Addendum Note (Signed)
Encounter addended by: Marcy SirenBrennan, Khylei Wilms S, LCSW on: 08/17/2016  2:09 PM<BR>    Actions taken: Sign clinical note

## 2016-08-18 LAB — CUP PACEART INCLINIC DEVICE CHECK
Battery Remaining Longevity: 60 mo
HIGH POWER IMPEDANCE MEASURED VALUE: 44 Ohm
Lead Channel Impedance Value: 450 Ohm
Lead Channel Pacing Threshold Amplitude: 2.5 V
Lead Channel Sensing Intrinsic Amplitude: 4.3 mV
Lead Channel Setting Pacing Amplitude: 4 V
Lead Channel Setting Pacing Pulse Width: 1 ms
MDC IDC LEAD IMPLANT DT: 20070207
MDC IDC LEAD LOCATION: 753860
MDC IDC MSMT BATTERY REMAINING PERCENTAGE: 71 %
MDC IDC MSMT LEADCHNL RV PACING THRESHOLD PULSEWIDTH: 1 ms
MDC IDC PG IMPLANT DT: 20141215
MDC IDC SESS DTM: 20180817155821
MDC IDC SET LEADCHNL RV SENSING SENSITIVITY: 0.5 mV
MDC IDC STAT BRADY RV PERCENT PACED: 1 % — AB
Pulse Gen Serial Number: 7155171

## 2016-08-18 NOTE — Progress Notes (Signed)
ICD check in clinic. Normal device function. Increase in RV threshold noted--now 3.5V@0 .19ms/2.5V@1 .23ms (was 2.0@0 .66ms on 6/4)--pulse width increased to 1.51ms. Sensing consistent with previous device measurements. Impedance trends stable over time. (1) VT-NS episode x 6sec. Histogram distribution appropriate for patient and level of activity. Stable thoracic impedance. Device programmed at appropriate safety margins. Device programmed to optimize intrinsic conduction. Estimated longevity 5.0-6.0 years. Pt will follow up with DC in 3 months. Patient education completed including shock plan. Vibration demonstrated for patient.

## 2016-08-23 ENCOUNTER — Telehealth (HOSPITAL_COMMUNITY): Payer: Self-pay | Admitting: *Deleted

## 2016-08-23 ENCOUNTER — Other Ambulatory Visit (HOSPITAL_COMMUNITY): Payer: Self-pay | Admitting: *Deleted

## 2016-08-23 DIAGNOSIS — Z5181 Encounter for therapeutic drug level monitoring: Secondary | ICD-10-CM

## 2016-08-23 DIAGNOSIS — Z95811 Presence of heart assist device: Secondary | ICD-10-CM

## 2016-08-23 NOTE — Telephone Encounter (Signed)
Called patient after reports of "multiple low flow alarms since Saturday" reported. Reported 1 suction alarm this morning. Instructed patient at support group on Monday night (8/20) to reduce Monday evening dose of Torsemide to 40 mg. Per patient, he took 80 mg BID on Monday, 60 mg BID on Tuesday, and 40 mg this morning. Instructed patient to hold tonight's dose and Thursday mornings dose. States his weight has been 229-230 the past 3 days. States he is asymptomatic.  Patient has appointement in VAD clinic tomorrow morning. Reviewed with Dr. Shirlee Latch.  Marcellus Scott RN, VAD Coordinator 24/7 pager [phone]

## 2016-08-24 ENCOUNTER — Ambulatory Visit (HOSPITAL_COMMUNITY)
Admission: RE | Admit: 2016-08-24 | Discharge: 2016-08-24 | Disposition: A | Discharge status: Home / Self Care | Payer: BLUE CROSS/BLUE SHIELD | Source: Ambulatory Visit | Attending: Internal Medicine | Admitting: Internal Medicine

## 2016-08-24 ENCOUNTER — Ambulatory Visit (HOSPITAL_COMMUNITY): Payer: Self-pay | Admitting: Pharmacist

## 2016-08-24 ENCOUNTER — Encounter (HOSPITAL_COMMUNITY): Payer: Self-pay

## 2016-08-24 VITALS — BP 110/0 | HR 83 | Ht 72.0 in | Wt 238.2 lb

## 2016-08-24 DIAGNOSIS — Z95810 Presence of automatic (implantable) cardiac defibrillator: Secondary | ICD-10-CM | POA: Diagnosis not present

## 2016-08-24 DIAGNOSIS — Z5181 Encounter for therapeutic drug level monitoring: Secondary | ICD-10-CM

## 2016-08-24 DIAGNOSIS — Z87891 Personal history of nicotine dependence: Secondary | ICD-10-CM | POA: Diagnosis not present

## 2016-08-24 DIAGNOSIS — I5023 Acute on chronic systolic (congestive) heart failure: Secondary | ICD-10-CM

## 2016-08-24 DIAGNOSIS — I5022 Chronic systolic (congestive) heart failure: Secondary | ICD-10-CM | POA: Diagnosis not present

## 2016-08-24 DIAGNOSIS — I252 Old myocardial infarction: Secondary | ICD-10-CM | POA: Diagnosis not present

## 2016-08-24 DIAGNOSIS — I255 Ischemic cardiomyopathy: Secondary | ICD-10-CM | POA: Insufficient documentation

## 2016-08-24 DIAGNOSIS — E1151 Type 2 diabetes mellitus with diabetic peripheral angiopathy without gangrene: Secondary | ICD-10-CM | POA: Insufficient documentation

## 2016-08-24 DIAGNOSIS — I110 Hypertensive heart disease with heart failure: Secondary | ICD-10-CM | POA: Diagnosis not present

## 2016-08-24 DIAGNOSIS — J449 Chronic obstructive pulmonary disease, unspecified: Secondary | ICD-10-CM | POA: Insufficient documentation

## 2016-08-24 DIAGNOSIS — I2510 Atherosclerotic heart disease of native coronary artery without angina pectoris: Secondary | ICD-10-CM | POA: Insufficient documentation

## 2016-08-24 DIAGNOSIS — Z7902 Long term (current) use of antithrombotics/antiplatelets: Secondary | ICD-10-CM | POA: Insufficient documentation

## 2016-08-24 DIAGNOSIS — R197 Diarrhea, unspecified: Secondary | ICD-10-CM | POA: Insufficient documentation

## 2016-08-24 DIAGNOSIS — E785 Hyperlipidemia, unspecified: Secondary | ICD-10-CM | POA: Diagnosis not present

## 2016-08-24 DIAGNOSIS — Z7901 Long term (current) use of anticoagulants: Secondary | ICD-10-CM | POA: Diagnosis not present

## 2016-08-24 DIAGNOSIS — K31811 Angiodysplasia of stomach and duodenum with bleeding: Secondary | ICD-10-CM

## 2016-08-24 DIAGNOSIS — Z955 Presence of coronary angioplasty implant and graft: Secondary | ICD-10-CM | POA: Diagnosis not present

## 2016-08-24 DIAGNOSIS — I509 Heart failure, unspecified: Secondary | ICD-10-CM

## 2016-08-24 DIAGNOSIS — Z95811 Presence of heart assist device: Secondary | ICD-10-CM | POA: Diagnosis not present

## 2016-08-24 DIAGNOSIS — Z7982 Long term (current) use of aspirin: Secondary | ICD-10-CM | POA: Insufficient documentation

## 2016-08-24 DIAGNOSIS — Z794 Long term (current) use of insulin: Secondary | ICD-10-CM | POA: Diagnosis not present

## 2016-08-24 DIAGNOSIS — Z48812 Encounter for surgical aftercare following surgery on the circulatory system: Secondary | ICD-10-CM | POA: Insufficient documentation

## 2016-08-24 DIAGNOSIS — I2721 Secondary pulmonary arterial hypertension: Secondary | ICD-10-CM | POA: Insufficient documentation

## 2016-08-24 LAB — BASIC METABOLIC PANEL
ANION GAP: 8 (ref 5–15)
BUN: 9 mg/dL (ref 6–20)
CHLORIDE: 104 mmol/L (ref 101–111)
CO2: 27 mmol/L (ref 22–32)
Calcium: 8.5 mg/dL — ABNORMAL LOW (ref 8.9–10.3)
Creatinine, Ser: 0.9 mg/dL (ref 0.61–1.24)
Glucose, Bld: 134 mg/dL — ABNORMAL HIGH (ref 65–99)
POTASSIUM: 3.6 mmol/L (ref 3.5–5.1)
SODIUM: 139 mmol/L (ref 135–145)

## 2016-08-24 LAB — CBC
HEMATOCRIT: 32.4 % — AB (ref 39.0–52.0)
Hemoglobin: 10.3 g/dL — ABNORMAL LOW (ref 13.0–17.0)
MCH: 28.9 pg (ref 26.0–34.0)
MCHC: 31.8 g/dL (ref 30.0–36.0)
MCV: 90.8 fL (ref 78.0–100.0)
Platelets: 133 10*3/uL — ABNORMAL LOW (ref 150–400)
RBC: 3.57 MIL/uL — AB (ref 4.22–5.81)
RDW: 17.3 % — AB (ref 11.5–15.5)
WBC: 6.2 10*3/uL (ref 4.0–10.5)

## 2016-08-24 LAB — PROTIME-INR
INR: 2.71
PROTHROMBIN TIME: 29.3 s — AB (ref 11.4–15.2)

## 2016-08-24 LAB — LACTATE DEHYDROGENASE: LDH: 220 U/L — AB (ref 98–192)

## 2016-08-24 MED ORDER — TORSEMIDE 20 MG PO TABS: 60.0000 mg | ORAL_TABLET | Freq: Two times a day (BID) | ORAL | 3 refills | Status: DC

## 2016-08-24 MED ORDER — DANAZOL 100 MG PO CAPS: 100.0000 mg | ORAL_CAPSULE | Freq: Two times a day (BID) | ORAL | 6 refills | Status: DC

## 2016-08-24 NOTE — Progress Notes (Signed)
LATE ENTRY Patient's diabiilty claim appeal for [employer] mailed to provided address with requested/supporting documents and reports to:  [employer] Dept [address] [address] [place], [place] [address]  Send via mail on Tuesday August 21st. Patient made aware. Copy of form and appeal letter scanned into patient's electronic medical record.  [name], RN

## 2016-08-24 NOTE — Addendum Note (Signed)
Addendum  created 08/24/16 1242 by Kipp Brood, MD   Sign clinical note

## 2016-08-24 NOTE — Progress Notes (Addendum)
Patient presents for 2 week  follow up in [facility] today. Reports multiple "low flow" alarms and one "suction event" over last few days. States they occur in the morning soon after he wakes up. States he is still having diarrhea, feels it is from his Octreotide injections (dose has been decreased to 10 mg monthly). States he is having multiple "loose stools" daily.   Pt states he has not started Cardiac Rehab yet, but will begin when he "is ready physically".  HVAD event log downloaded and sent to [email or web address] and to Bessie Sycip (our clinical rep) with results noted below:    Rred arrows to show this zoomed in view from the 15th to today.  You can see where around 6 am he consistently shows a drop in pulsatility but it is very brief, then resolves likely after he has had some breakfast and hydrates.  His true suction event where his trough dropped is on 8/21 (the circled part)  His typical average flow is 5.2, minimum flow 4.5! His typical average peak and trough pulsatility is 6.8-3.7 l/min.    Vital Signs:  Doppler Pressure 110  Automatc BP: 107/84 (92) HR: 83 SPO2:100 %  Weight: 238.2  lb w/o eq Last weight: 234.6 lbs  VAD Indication: Destination Therapy- smoker   VAD interrogation & Equipment Management (Reviewed with Dr. Aundra Dubin):  Speed: 2840 Flow: 4.9 Power: 5.1 w Peak/Trough: 7/3 HCT- 32 Alarm settings: 3.5/7 - re-programmed to 3.0/7.0 Suction: On Lavare: On  Alarms: 8 suction alarms since August 15, 2016.  18 Low Flow alarms since August 17, 2016   Exit Site Care: Drive line is being maintained twice weekly  by [employer].  Existing VAD dressing removed and site care performed using sterile technique. Drive line exit site cleaned with Chlora prep applicators x 2, allowed to dry, and Sorbaview dressing with bio patch applied. Exit site healed and incorporated, the velour is fully implanted at exit site. No redness, tenderness, drainage, foul odor or rash  noted. Drive line anchor re-applied. Pt denies fever or chills. Will advance to weekly dressing changes.   Significant Events on VAD Support:  05/31/16 - Controller power-up associated with pump start event. Controller change out performed.  06/01/16 - Controller pins lubricated per St Jude reps  06/17/16 - Controller power-up associated with pump start event while changing power source 06/22/16 - Controller pins re-lubricated per St Jude reps   Device:St Jude single lead- followed at device clinic Therapies: on at 200 bpm Last check: post implant  BP & Labs:  MAP 110 - Doppler is reflecting modified systolic  Hgb 83.1 - No S/S of bleeding. Specifically denies melena/BRBPR or nosebleeds.  LDH stable at 220 with established baseline of 190- 250. Denies tea-colored urine. No power elevations noted on interrogation.   Patient Instructions:  1.  Decrease Torsemide to 60 mg twice daily. 2.  May advance to weekly dressing changes. 3.  Stop Octrotide shots. 4.  Start Danazol 100 mg twice daily. 5.  Decrease coumadin to 10 mg daily except 7.5 mg on Mon/Wed/Fri 6.  Return for BP and wound check in one week. 7.  Return to [facility] in 2 weeks.  Zada Girt RN VAD Coordinator   Office: [phone] 24/7 Emergency VAD Pager: [phone]      Advanced Heart Failure Clinic Note   PCP: Dr. Kathyrn Lass Cardiology: Dr. Stanford Breed HF Cardiology: Dr. Aundra Dubin  61 yo smoker with history of PAD, CAD, and ischemic cardiomyopathy now s/p HVAD placement  presents for VAD clinic followup.  Patient was admitted in 1/18 with PNA and acute on chronic systolic CHF.  He was markedly volume overloaded and also had low cardiac output requiring dobutamine use.  He had a right pleural effusion and underwent thoracentesis.  Fluid studies suggested a parapneumonic effusion.  He had cardiac cath, showing 3 vessel disease and markedly elevated filling pressures, right and left.  He was evaluated for CABG but turned  down given comorbidities and lack of venous conduits.  We were able to wean him off dobutamine and discharged him with plan for outpatient PCI once he had recovered from the current hospitalization.   Admitted 01/28/16 - 02/03/16 after presenting for elective 2 vessel PCI of the LAD and OM of the circumflex. RHC at same time showed advanced HF with low cardiac index and severely elevated pressures. Diuresed with IV lasix, metolazone, and milrinone, but refused PICC line to follow coox/CVP. Underwent thoracentesis 02/01/16 with 1.8 L out. Overall diuresed 16 lbs. Discharge weight 214 lbs.  He was admitted in 4/18 with low output HF and severe NYHA class IV symptoms, started on milrinone.  Unable to titrate off milrinone and is now on at home.  He had a right chronic transudative effusion, PleurX catheter was placed.    He had HVAD placed 05/30/16.  He was diuresed post-op and speed was increased to 3000.  It was later dropped to 2940.  Pleurx catheter was removed.    He was admitted in 7/18 with upper GI bleed.  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  He had 3 units PRBCs.  ASA was decreased from 325 to 81 daily and warfarin goal was decreased to 2-2.5.  Octreotide has been started.   He has calf claudication bilaterally after walking a moderate distance.  This is chronic and followed by [facility].    After 1st octreotide dose, he has developed profuse diarrhea. We decreased dose to 10 mg for the next dose and he has continued to have several episodes diarrhea daily.  He has had suction alarms the last several mornings but not during the rest of the day. MAP in 80s at home, 92 here today.  No significant exertional dyspnea walking around on flat ground or doing ADLs.  No orthopnea/PND.    Labs (1/18): K 4.2, creatinine 0.9, hgb 12.6 Labs (2/18): K 4.2, creatinine 1.23, hgb 12.4, BNP 321 Labs (4/18): hgbA1c 8.9, TSH elevated but free T4 normal.  K 3.2, creatinine 0.8 Labs (5/18): digoxin  0.4, K 3.4, creatinine 0.9 Labs (6/18): creatinine 0.67 => 0.98, LDH 209, hgb 8.9 => 9 Labs (7/18): K 2.9, creatinine 1.02, LDL 189, hgb 9 Labs (8/18): hgb 10 => 10.2, LDH 204, K 3.2, creatinine 0.9 => 1.04, INR 2.45, LDH 204  HVAD interrogation: See LVAD nurse's note above  PMH: 1. PAD: Left fem-pop bypass with graft in 2017.  2. CAD: Anterior MI 2005 => PCI to LAD followed by staged PCI to LCx and RCA.   - LHC (1/18): Diffuse up to 80% calcified stenosis throughout the proximal LAD.  Diffuse disease in distal LAD up to 50%.  Large OM1 totally occluded at the ostium but reconstituted via collaterals.  80% distal RCA stenosis involving the ostia of the PLV and PDA with about 70% stenosis. Patient had PCI with DES to proximal LAD and PTCA OM1 (unable to place stent).  3. Chronic systolic CHF: Ischemic cardiomyopathy.  St Jude ICD.  - Echo (1/18) with EF 20%,  mildly decreased RV systolic function.  - RHC (1/18) with mean RA 21, PA 69/31 mean 45, mean PCWP 27, CI 2.37, PVR 3.3 WU.  - Repeat RHC (1/18) with mean RA 16, mean PCWP 21, CI 1.92 - Echo (3/18) with EF 20-25%, mild MR, mild RV dilation with normal systolic function, PASP 47 mmHg.  - 4/18 admission with low output HF and milrinone gtt started (co-ox as low as 42%).  - RHC on milrinone (4/18): mean RA 5, PA 61/24 mean 40, mean PCWP 17, CI 2.1 Fick with PVR 5.1 WU, CI 1.84 thermo with PVR 5.9 WU.  - HVAD placement 05/30/16.  4. Carotid stenosis: Carotid dopplers (8/16) with 16-07% RICA, 37-10% LICA.   - Carotid dopplers (2/18) with 40-59% RICA stenosis.  5. Type II diabetes. 6. Hyperlipidemia. 7. HTN 8. COPD:  PFTs (5/18) with moderate to severe obstructive disease.  Active smoker. 9. Chronic right pleural effusion (transudative) with PleurX catheter.  10. Upper GI bleeding: 7/18, enteroscopy with 2 small bowel AVMs (treated).   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  He has quit smoking.  Occasional ETOH.   Family  History  Problem Relation Age of Onset  . Diabetes Mother   . Coronary artery disease Father   . Heart disease Father        before age 42   ROS: All systems reviewed and negative except as per HPI.   Current Outpatient Prescriptions  Medication Sig Dispense Refill  . aspirin EC 81 MG EC tablet Take 1 tablet (81 mg total) by mouth daily. 30 tablet 6  . atorvastatin (LIPITOR) 40 MG tablet Take 1 tablet (40 mg total) by mouth daily. 90 tablet 3  . blood glucose meter kit and supplies Ultra Blue Kit Dispense based on patient and insurance preference. Use up to four times daily as directed. (FOR ICD-9 250.00, 250.01). 1 each 0  . ezetimibe (ZETIA) 10 MG tablet Take 1 tablet (10 mg total) by mouth daily. 90 tablet 3  . ferrous [id number] C-folic acid (TRINSICON / FOLTRIN) capsule Take 1 capsule by mouth 2 (two) times daily between meals. 60 capsule 6  . gabapentin (NEURONTIN) 100 MG capsule Take 1 capsule (100 mg total) by mouth every 12 (twelve) hours as needed. (Patient taking differently: Take 100 mg by mouth daily. ) 60 capsule 6  . insulin NPH-regular Human (NOVOLIN 70/30) (70-30) 100 UNIT/ML injection Inject 15 Units into the skin 3 (three) times daily.     Marland Kitchen losartan (COZAAR) 25 MG tablet Take 1 tablet (25 mg total) by mouth 2 (two) times daily. 60 tablet 6  . pantoprazole (PROTONIX) 40 MG tablet Take 1 tablet (40 mg total) by mouth 2 (two) times daily. 60 tablet 6  . potassium chloride SA (K-DUR,KLOR-CON) 20 MEQ tablet Take 2 tablets (40 mEq total) by mouth 2 (two) times daily. 90 tablet 3  . sildenafil (REVATIO) 20 MG tablet Take 1 tablet (20 mg total) by mouth 3 (three) times daily. 90 tablet 11  . torsemide (DEMADEX) 20 MG tablet Take 3 tablets (60 mg total) by mouth 2 (two) times daily. 180 tablet 3  . warfarin (COUMADIN) 5 MG tablet Take 10 mg (2 tablets) daily except 7.5 mg (1 and 1/2 tablets) on Monday, Wednesday, Friday    . danazol (DANOCRINE) 100 MG capsule Take 1  capsule (100 mg total) by mouth 2 (two) times daily. 60 capsule 6   No current facility-administered medications for this encounter.  BP (!) 110/0 Comment: Doppler modified systolic  Pulse 83   Ht 6' (1.829 m)   Wt 238 lb 3.2 oz (108 kg)   SpO2 100%   BMI 32.31 kg/m   MAP 92 General: NAD Neck: JVP 9-10 cm, no thyromegaly or thyroid nodule.  Lungs: Clear to auscultation bilaterally with normal respiratory effort. CV: LVAD hum. 1+ ankle edema. No carotid bruit. Unable to palpatepedal pulses.  Abdomen: Soft, nontender, no hepatosplenomegaly, no distention.  Skin: Venous stasis changes lower legs. Driveline site benign.  Neurologic: Alert and oriented x 3.  Psych: Normal affect. Extremities: No clubbing or cyanosis.  HEENT: Normal.   Wt Readings from Last 3 Encounters:  08/24/16 238 lb 3.2 oz (108 kg)  08/04/16 234 lb 9.6 oz (106.4 kg)  07/28/16 233 lb 6.4 oz (105.9 kg)   Assessment/Plan: 1. Chronic systolic CHF: Ischemic cardiomyopathy.  Echo in 3/18 with EF 20-25%.  He did not have much effect from 12/17 PCI in terms of symptoms or EF.  St Jude ICD.  He is now s/p HVAD placement.  Lavare cycle is on.  He remains mildly volume overloaded, NYHA class II.  Suspect he has a degree of RV failure and we will not get his CVP down to normal range.  He has had suction alarms in the morning for 2-3 days in the setting of high dose torsemide 80 mg bid + diarrhea likely from octreotide.  - Decrease torsemide to 60 mg bid for now and will encourage him to follow a very low sodium diet.     - Continue losartan 25 mg bid.  - Needs to do cardiac rehab.  - He is going to think about whether he would eventually want to have transplant evaluation.  If he does, will need to check nicotine levels.  2. CAD: 3 vessel CAD, s/p stent to proximal LAD and PTCA to subtotally occluded large OM1 in 1/18. No change in symptoms or EF with intervention.  - He was on Plavix for > 3 months post-intervention,  now on warfarin + ASA.  - Continue atorvastatin 80 mg daily.   3. PAD: Stable claudication, notes with moderate walking.   - He follows with [facility], has followup appointment. - I encouraged him to try walking through the pain for at least a short distance.   4. Carotid stenosis:  Followed at [facility].  5. R Pleural effusion: PleurX catheter removed prior to last discharge.  6. Pulmonary arterial HTN: Continue Revatio 20 mg tid.  7. Smoking: He has quit.   8. Upper GI bleeding: 7/18, small bowel AVMs.  ASA was decreased to 81 daily.  Warfarin INR goal is now 2-2.5.  He has started monthly octreotide injections but had profuse diarrhea with octreotide after 1st injection and after 2nd injection with decreased dose.   - Will stop octreotide and start danazol 100 mg bid to try to decrease risk of AVM bleeding.   Loralie Champagne 08/26/2016

## 2016-08-24 NOTE — Patient Instructions (Addendum)
1.  Decrease Torsemide to 60 mg twice daily. 2.  May advance to weekly dressing changes. 3.  Stop Octrotide shots. 4.  Start Danazol 100 mg twice daily. 5.  Decrease coumadin to 10 mg daily except 7.5 mg on Mon/Wed/Fri 6.  Return for BP and wound check in one week. 7.  Return to VAD clinic in 2 weeks.

## 2016-08-25 ENCOUNTER — Other Ambulatory Visit (HOSPITAL_COMMUNITY): Payer: Self-pay | Admitting: Unknown Physician Specialty

## 2016-08-25 DIAGNOSIS — Z7901 Long term (current) use of anticoagulants: Secondary | ICD-10-CM

## 2016-08-25 DIAGNOSIS — Z95811 Presence of heart assist device: Secondary | ICD-10-CM

## 2016-08-30 ENCOUNTER — Other Ambulatory Visit (HOSPITAL_COMMUNITY): Payer: Self-pay | Admitting: *Deleted

## 2016-08-30 ENCOUNTER — Encounter: Payer: Self-pay | Admitting: Vascular Surgery

## 2016-08-30 ENCOUNTER — Encounter (HOSPITAL_COMMUNITY): Payer: Self-pay

## 2016-08-30 DIAGNOSIS — Z95811 Presence of heart assist device: Secondary | ICD-10-CM

## 2016-08-30 NOTE — Progress Notes (Signed)
Worksheet completed for patient's disability claim along with requested/supporting documentation and reports. Placed in envelope in disability folder to give to patient during his apt with VAD clinic this week.  [name], RN

## 2016-08-31 ENCOUNTER — Ambulatory Visit (HOSPITAL_COMMUNITY): Payer: Self-pay | Admitting: Pharmacist

## 2016-08-31 ENCOUNTER — Telehealth (HOSPITAL_COMMUNITY): Payer: Self-pay

## 2016-08-31 ENCOUNTER — Ambulatory Visit (HOSPITAL_COMMUNITY)
Admission: RE | Admit: 2016-08-31 | Discharge: 2016-08-31 | Disposition: A | Discharge status: Home / Self Care | Payer: BLUE CROSS/BLUE SHIELD | Source: Ambulatory Visit | Attending: Cardiology | Admitting: Cardiology

## 2016-08-31 DIAGNOSIS — Z95811 Presence of heart assist device: Secondary | ICD-10-CM | POA: Insufficient documentation

## 2016-08-31 LAB — PROTIME-INR
INR: 2.96
PROTHROMBIN TIME: 30.6 s — AB (ref 11.4–15.2)

## 2016-08-31 NOTE — Telephone Encounter (Signed)
LVM for patient to inform that [place] DOT forms ready to pick up. Will place in disability folder until patient ready to pickup. Copy of forms scanned in to patient's electronic medical record.  [name], [name], RN

## 2016-08-31 NOTE — Addendum Note (Signed)
Encounter addended by: Lebron QuamHerbert, Ferman Basilio B, RN on: 08/31/2016 11:32 AM<BR>    Actions taken: Order Reconciliation Section accessed, Sign clinical note

## 2016-08-31 NOTE — Progress Notes (Signed)
Patient presents for dressing change and BP check in [facility] Clinic today. Reports multiple "low flow" alarms and "suction events" over last few days. States they occur in the morning soon after he wakes up.   HVAD event log downloaded and sent to [email or web address] and to Bessie Sycip (our clinical rep).    Vital Signs:  Doppler Pressure: 82 Automatc BP: 83/77 (80) HR: 83 SPO2:100 %  Weight: 238.2  lb w/o eq Last weight: 234.6 lbs  VAD Indication: Destination Therapy- smoker   VAD interrogation & Equipment Management (Reviewed with Dr. Shirlee LatchMcLean):  Speed: 2840 Flow: 5.0 Power: 5.3 w Peak/Trough: 7.2/4.1 HCT- 32 Alarm settings: 3/7 Suction: On Lavare: On  Alarms: 22 suction alarms since August 19, 2016.  22 Low Flow alarms since August 17, 2016   Exit Site Care: Drive line is being maintained weekly  by [employer].  Existing VAD dressing removed and site care performed using sterile technique. Drive line exit site cleaned with Chlora prep applicators x 2, allowed to dry, and Sorbaview dressing with bio patch applied. Exit site healed and incorporated, the velour is fully implanted at exit site. No redness, tenderness, drainage, foul odor or rash noted. Drive line anchor re-applied. Pt denies fever or chills.Pt instructed that he may now shower. Instructions given on how to use his shower bag.  Significant Events on VAD Support:  05/31/16 - Controller power-up associated with pump start event. Controller change out performed.  06/01/16 - Controller pins lubricated per [employer] Jude reps  06/17/16 - Controller power-up associated with pump start event while changing power source 06/22/16 - Controller pins re-lubricated per St Jude reps   Device:St Jude single lead- followed at device clinic Therapies: on at 200 bpm Last check: post implant   Patient Instructions:  1.  Decrease Torsemide to 40 mg twice daily. 2. Decrease Cozaar to 12.5 mg BID. 3. Return to clinic in one week  for full visit.  Carlton AdamSarah Seichi Kaufhold RN VAD Coordinator   Office: [phone] 24/7 Emergency VAD Pager: [phone]      Advanced Heart Failure Clinic Note

## 2016-09-06 ENCOUNTER — Ambulatory Visit (INDEPENDENT_AMBULATORY_CARE_PROVIDER_SITE_OTHER): Payer: BLUE CROSS/BLUE SHIELD | Admitting: Vascular Surgery

## 2016-09-06 ENCOUNTER — Encounter: Payer: Self-pay | Admitting: Vascular Surgery

## 2016-09-06 ENCOUNTER — Ambulatory Visit (INDEPENDENT_AMBULATORY_CARE_PROVIDER_SITE_OTHER)
Admit: 2016-09-06 | Discharge: 2016-09-06 | Disposition: A | Discharge status: Home / Self Care | Payer: BLUE CROSS/BLUE SHIELD | Attending: Vascular Surgery | Admitting: Vascular Surgery

## 2016-09-06 ENCOUNTER — Ambulatory Visit (HOSPITAL_COMMUNITY)
Admission: RE | Admit: 2016-09-06 | Discharge: 2016-09-06 | Disposition: A | Discharge status: Home / Self Care | Payer: BLUE CROSS/BLUE SHIELD | Source: Ambulatory Visit | Attending: Vascular Surgery | Admitting: Vascular Surgery

## 2016-09-06 VITALS — HR 73 | Temp 97.8°F | Ht 72.0 in | Wt 232.2 lb

## 2016-09-06 DIAGNOSIS — I739 Peripheral vascular disease, unspecified: Secondary | ICD-10-CM | POA: Diagnosis not present

## 2016-09-06 NOTE — Progress Notes (Signed)
Patient name: [name] MRN: [id number] DOB: June 08, 1955 Sex: male  REASON FOR VISIT:    Follow up of peripheral vascular disease.  HPI:   [name] is a pleasant 61 y.o. male who had a left femoral to below-knee popliteal artery bypass on 11/17/2014. The saphenous vein was not adequate and therefore this was a prosthetic graft. He has had a previous right femoropopliteal bypass in 2015. When I last saw him on 10/20/2015, both of his bypass grafts were patent. We discussed the importance of tobacco cessation and he comes in for a routine follow up visit.  Since I saw him last, he has had a left ventricular assist device placed in May of this year. He is doing very well from that standpoint. His only complaint from a vascular standpoint is some left calf claudication. His symptoms are brought on by ambulation and relieved with rest. He has no claudication in the right leg. He denies any history of rest pain or nonhealing ulcers.  Past Medical History:  Diagnosis Date  . AICD (automatic cardioverter/defibrillator) present   . Arthritis   . Barretts syndrome   . Carotid artery disease ([place])    a. Dopp 09/2011: 22-02% RICA, 54-27% LICA.;  b.  Carotid US (7/14):  Bilateral 60-79% => f/u 6 mos  . Cerebrovascular disease   . Chronic systolic heart failure ([place])   . Coronary artery disease    a. AWMI requiring IABP 2005 s/p Horizon study stent-LAD, staged BMS-Cx, DESx2-RCA. b. Last Last LHC (6/06):  EF 25%, pLAD stent 40-50, after stent 40, dLAD 20, pCFX 20, pOM1 70, pRCA 20, mRCA stents ok.=> med Rx;  c.  Myoview (7/14):  EF 26%, large ant, septal, inf and apical infarct, no ischemia.  Med Rx continued  . Diabetes mellitus   . Dyspnea    with exertion  . Headache   . Heart murmur   . History of kidney stones   . HTN (hypertension)   . Hyperlipidemia    mixed  . ICD (implantable cardiac defibrillator), dual, in situ    St. Jude for severe LVD EF 25% 2/07 explanted 2010. Medtronic  Virtuoso II DR Dual-chamber cardiverter-defibrillator  with pocket revision, Dr. Caryl Comes  . Ischemic cardiomyopathy    a. Echo (09/27/12): EF 20%, diffuse HK with periapical AK, no LV thrombus noted, restrictive physiology, trivial MR, mild LAE, RVSF mildly reduced, PASP 39  . Pleural effusion 01/05/2016  . Presence of permanent cardiac pacemaker   . PVD (peripheral vascular disease) ([facility])    a. ABI 09/2011 - R normal, L moderate - saw Dr. Fletcher Anon - med rx.   Marland Kitchen RIATA ICD Lead--on advisory recall    . Tobacco abuse     Family History  Problem Relation Age of Onset  . Diabetes Mother   . Coronary artery disease Father   . Heart disease Father        before age 84    SOCIAL HISTORY: Social History  Substance Use Topics  . Smoking status: Current Every Day Smoker    Packs/day: 0.50    Years: 45.00    Types: Cigarettes  . Smokeless tobacco: Never Used     Comment: pt reports he is stress smoker  . Alcohol use No    Allergies  Allergen Reactions  . Metformin And Related Nausea And Vomiting    *Only the extended release* (does this)  . Niacin And Related Other (See Comments)    REACTION IS SIDE EFFECT "  SEVERE" FLUSHING    Current Outpatient Prescriptions  Medication Sig Dispense Refill  . aspirin EC 81 MG EC tablet Take 1 tablet (81 mg total) by mouth daily. 30 tablet 6  . atorvastatin (LIPITOR) 40 MG tablet Take 1 tablet (40 mg total) by mouth daily. 90 tablet 3  . blood glucose meter kit and supplies Ultra Blue Kit Dispense based on patient and insurance preference. Use up to four times daily as directed. (FOR ICD-9 250.00, 250.01). 1 each 0  . danazol (DANOCRINE) 100 MG capsule Take 1 capsule (100 mg total) by mouth 2 (two) times daily. 60 capsule 6  . ezetimibe (ZETIA) 10 MG tablet Take 1 tablet (10 mg total) by mouth daily. 90 tablet 3  . ferrous [id number] C-folic acid (TRINSICON / FOLTRIN) capsule Take 1 capsule by mouth 2 (two) times daily between meals. 60  capsule 6  . gabapentin (NEURONTIN) 100 MG capsule Take 100 mg by mouth daily.    . insulin NPH-regular Human (NOVOLIN 70/30) (70-30) 100 UNIT/ML injection Inject 15 Units into the skin 3 (three) times daily.     Marland Kitchen losartan (COZAAR) 25 MG tablet Take 1 tablet (25 mg total) by mouth 2 (two) times daily. (Patient taking differently: Take 12.5 mg by mouth 2 (two) times daily. ) 60 tablet 6  . pantoprazole (PROTONIX) 40 MG tablet Take 1 tablet (40 mg total) by mouth 2 (two) times daily. 60 tablet 6  . potassium chloride SA (K-DUR,KLOR-CON) 20 MEQ tablet Take 2 tablets (40 mEq total) by mouth 2 (two) times daily. 90 tablet 3  . sildenafil (REVATIO) 20 MG tablet Take 1 tablet (20 mg total) by mouth 3 (three) times daily. 90 tablet 11  . torsemide (DEMADEX) 20 MG tablet Take 3 tablets (60 mg total) by mouth 2 (two) times daily. (Patient taking differently: Take 40 mg by mouth 2 (two) times daily. ) 180 tablet 3  . warfarin (COUMADIN) 5 MG tablet Take 7.5 mg (1 and 1/2 tablets) daily except 10 mg (2 tablets) on Monday, Friday     No current facility-administered medications for this visit.     REVIEW OF SYSTEMS:  [X]  denotes positive finding, [ ]  denotes negative finding Cardiac  Comments:  Chest pain or chest pressure:    Shortness of breath upon exertion:    Short of breath when lying flat:    Irregular heart rhythm:        Vascular    Pain in calf, thigh, or hip brought on by ambulation:    Pain in feet at night that wakes you up from your sleep:     Blood clot in your veins:    Leg swelling:  X       Pulmonary    Oxygen at home:    Productive cough:     Wheezing:         Neurologic    Sudden weakness in arms or legs:     Sudden numbness in arms or legs:     Sudden onset of difficulty speaking or slurred speech:    Temporary loss of vision in one eye:     Problems with dizziness:         Gastrointestinal    Blood in stool:     Vomited blood:         Genitourinary    Burning  when urinating:     Blood in urine:        Psychiatric    Major depression:  Hematologic    Bleeding problems:    Problems with blood clotting too easily:        Skin    Rashes or ulcers:        Constitutional    Fever or chills:     PHYSICAL EXAM:   Vitals:   09/06/16 1111  Pulse: 73  Temp: 97.8 F (36.6 C)  TempSrc: Oral  SpO2: 99%  Weight: 232 lb 3.2 oz (105.3 kg)  Height: 6' (1.829 m)    GENERAL: The patient is a well-nourished male, in no acute distress. The vital signs are documented above. CARDIAC: He has a left ventricular assist device with normal mechanical sounds. VASCULAR: I do not detect carotid bruits. I cannot palpate femoral, popliteal, or pedal pulses. He has hyperpigmentation bilaterally consistent with chronic venous insufficiency. PULMONARY: There is good air exchange bilaterally without wheezing or rales. ABDOMEN: Soft and non-tender with normal pitched bowel sounds.  MUSCULOSKELETAL: There are no major deformities or cyanosis. NEUROLOGIC: No focal weakness or paresthesias are detected. SKIN: He has hyperpigmentation bilaterally consistent with chronic venous insufficiency. PSYCHIATRIC: The patient has a normal affect.  DATA:    ARTERIAL DOPPLER STUDY: I have been apparently interpreted his arterial Doppler study today.  On the right side there is a monophasic dorsalis pedis signal and monophasic posterior tibial signal. ABI is 100% although likely falsely elevated. Toe pressure on the right is 88 mmHg.  On the left side there is a monophasic dorsalis pedis signal. ABI is 91% although falsely elevated. Toe pressure on the left is 44 mmHg.  BILATERAL LOWER SURETY DUPLEX: Leane Call interpreted his bilateral lower shimmy graft duplex. There is monophasic Doppler signals in the right femoropopliteal bypass graft suggesting proximal occlusive disease. The left femoropopliteal bypass graft appears to be occluded.  MEDICAL ISSUES:    PERIPHERAL VASCULAR DISEASE: This patient at some point silently occluded his left femoropopliteal bypass graft. This was patent when he was last seen in November 2016 but then he was lost to follow up. The right femoropopliteal bypass graft is patent with monophasic flows proximally however this could be attributed to his left ventricular assist device. Regardless, he is having minimal symptoms with only some mild left calf claudication. He has no rest pain. I would not recommend an aggressive workup for this and encouraged him to simply stay as active as possible. A fourth A, he does continue to smoke and we have again discussed the importance of tobacco cessation. I bordered follow up ABIs in 1 year and I'll see him back that time. He knows to call sooner if he has problems.  I spent 25 minutes of face-to-face time with the patient.  Deitra Mayo [facility] [phone]

## 2016-09-07 ENCOUNTER — Ambulatory Visit (HOSPITAL_COMMUNITY)
Admission: RE | Admit: 2016-09-07 | Discharge: 2016-09-07 | Disposition: A | Discharge status: Home / Self Care | Payer: BLUE CROSS/BLUE SHIELD | Source: Ambulatory Visit | Attending: Internal Medicine | Admitting: Internal Medicine

## 2016-09-07 ENCOUNTER — Encounter (HOSPITAL_COMMUNITY): Payer: Self-pay

## 2016-09-07 ENCOUNTER — Ambulatory Visit (HOSPITAL_COMMUNITY): Payer: Self-pay | Admitting: Pharmacist

## 2016-09-07 ENCOUNTER — Other Ambulatory Visit (HOSPITAL_COMMUNITY): Payer: Self-pay | Admitting: *Deleted

## 2016-09-07 VITALS — BP 119/67 | HR 77 | Resp 16 | Ht 72.0 in | Wt 229.6 lb

## 2016-09-07 DIAGNOSIS — I2510 Atherosclerotic heart disease of native coronary artery without angina pectoris: Secondary | ICD-10-CM | POA: Insufficient documentation

## 2016-09-07 DIAGNOSIS — Z4502 Encounter for adjustment and management of automatic implantable cardiac defibrillator: Secondary | ICD-10-CM | POA: Diagnosis not present

## 2016-09-07 DIAGNOSIS — I2721 Secondary pulmonary arterial hypertension: Secondary | ICD-10-CM | POA: Diagnosis not present

## 2016-09-07 DIAGNOSIS — F17210 Nicotine dependence, cigarettes, uncomplicated: Secondary | ICD-10-CM | POA: Diagnosis not present

## 2016-09-07 DIAGNOSIS — I739 Peripheral vascular disease, unspecified: Secondary | ICD-10-CM | POA: Diagnosis not present

## 2016-09-07 DIAGNOSIS — Z955 Presence of coronary angioplasty implant and graft: Secondary | ICD-10-CM | POA: Insufficient documentation

## 2016-09-07 DIAGNOSIS — I110 Hypertensive heart disease with heart failure: Secondary | ICD-10-CM | POA: Diagnosis not present

## 2016-09-07 DIAGNOSIS — I255 Ischemic cardiomyopathy: Secondary | ICD-10-CM

## 2016-09-07 DIAGNOSIS — E1151 Type 2 diabetes mellitus with diabetic peripheral angiopathy without gangrene: Secondary | ICD-10-CM | POA: Insufficient documentation

## 2016-09-07 DIAGNOSIS — Z7901 Long term (current) use of anticoagulants: Secondary | ICD-10-CM | POA: Diagnosis not present

## 2016-09-07 DIAGNOSIS — Z794 Long term (current) use of insulin: Secondary | ICD-10-CM | POA: Insufficient documentation

## 2016-09-07 DIAGNOSIS — Z95811 Presence of heart assist device: Secondary | ICD-10-CM

## 2016-09-07 DIAGNOSIS — Z79899 Other long term (current) drug therapy: Secondary | ICD-10-CM | POA: Insufficient documentation

## 2016-09-07 DIAGNOSIS — E785 Hyperlipidemia, unspecified: Secondary | ICD-10-CM | POA: Diagnosis not present

## 2016-09-07 DIAGNOSIS — J449 Chronic obstructive pulmonary disease, unspecified: Secondary | ICD-10-CM | POA: Insufficient documentation

## 2016-09-07 DIAGNOSIS — Z7982 Long term (current) use of aspirin: Secondary | ICD-10-CM | POA: Insufficient documentation

## 2016-09-07 DIAGNOSIS — I5022 Chronic systolic (congestive) heart failure: Secondary | ICD-10-CM | POA: Diagnosis not present

## 2016-09-07 DIAGNOSIS — R197 Diarrhea, unspecified: Secondary | ICD-10-CM | POA: Diagnosis not present

## 2016-09-07 DIAGNOSIS — I252 Old myocardial infarction: Secondary | ICD-10-CM | POA: Insufficient documentation

## 2016-09-07 LAB — CBC
HCT: 35.5 % — ABNORMAL LOW (ref 39.0–52.0)
HEMOGLOBIN: 11.3 g/dL — AB (ref 13.0–17.0)
MCH: 28.8 pg (ref 26.0–34.0)
MCHC: 31.8 g/dL (ref 30.0–36.0)
MCV: 90.3 fL (ref 78.0–100.0)
Platelets: 139 10*3/uL — ABNORMAL LOW (ref 150–400)
RBC: 3.93 MIL/uL — AB (ref 4.22–5.81)
RDW: 16.7 % — ABNORMAL HIGH (ref 11.5–15.5)
WBC: 5.4 10*3/uL (ref 4.0–10.5)

## 2016-09-07 LAB — LACTATE DEHYDROGENASE: LDH: 206 U/L — AB (ref 98–192)

## 2016-09-07 LAB — PROTIME-INR
INR: 3.26
PROTHROMBIN TIME: 33 s — AB (ref 11.4–15.2)

## 2016-09-07 LAB — BASIC METABOLIC PANEL
Anion gap: 10 (ref 5–15)
BUN: 7 mg/dL (ref 6–20)
CALCIUM: 8.6 mg/dL — AB (ref 8.9–10.3)
CO2: 30 mmol/L (ref 22–32)
Chloride: 98 mmol/L — ABNORMAL LOW (ref 101–111)
Creatinine, Ser: 0.91 mg/dL (ref 0.61–1.24)
GLUCOSE: 234 mg/dL — AB (ref 65–99)
Potassium: 3.5 mmol/L (ref 3.5–5.1)
SODIUM: 138 mmol/L (ref 135–145)

## 2016-09-07 NOTE — Addendum Note (Signed)
Addended by: Burton ApleyPETTY, Seleny Allbright A on: 09/07/2016 10:42 AM   Modules accepted: Orders

## 2016-09-07 NOTE — Patient Instructions (Signed)
Return to clinic in 1 week for labs. Return in 1 month for doctor visit.  You look great! Keep up the good work!

## 2016-09-07 NOTE — Progress Notes (Addendum)
Patient presents for 2 week  follow up in [facility] today. Reports no problems with VAD equipment or concerns with drive line.   Vital Signs:  Doppler Pressure 88   Automatc BP: 119/67 (86) HR:77   SPO2:100  %  Weight: 229.6 lb w/o eqt Last weight: 238.2 lb Home weights: 230-235 lbs  VAD Indication: Destination Therapy - current smoker  VAD interrogation & Equipment Management (reviewed with Dr. Aundra Dubin): [id number] Flow: 5.2 Power:5.3 w    Peak/trough: 6.5/3.5 HCT:36 Alarm settings:3/7 Suction: On Lavare: On  Alarms: 8/31- 6 suction alarms. Autologs sent to [employer] clinical rep and reviewed with Dr. Aundra Dubin. No changes at this time.   Exit Site Care: Drive line is being maintained weekly  by [employer]. Drive line exit site well healed and incorporated. The velour is fully implanted at exit site. Dressing dry and intact. No erythema or drainage. Stabilization device present and accurately applied. Pt denies fever or chills. Pt states they have adequate dressing supplies at home.   Significant Events on VAD Support:  05/31/16 - Controller power-up associated with pump start event. Controller change out performed.  06/01/16 - Controller pins lubricated per St Jude reps  06/17/16 - Controller power-up associated with pump start event while changing power source 06/22/16 - Controller pins re-lubricated per St Jude reps   Device:St Jude single lead- followed at device clinic Therapies: on at 200 bpm Last check: post implant   BP & Labs:  MAP88 - Doppler is reflecting MAP  Hgb 11.3 - No S/S of bleeding. Specifically denies melena/BRBPR or nosebleeds.  LDH stable at 206 with established baseline of 190- 250. Denies tea-colored urine. No power elevations noted on interrogation.   Plan: 1. RTC in 1 month 2. Patient states he does not want a home INR machine. Will plan to come to clinic weekly for INR checks. 3. Stop smoking  Balinda Quails RN VAD Coordinator    Office: [phone] 24/7 Emergency VAD Pager: [phone]      Advanced Heart Failure Clinic Note   PCP: Dr. Kathyrn Lass Cardiology: Dr. Stanford Breed HF Cardiology: Dr. Aundra Dubin  61 yo smoker with history of PAD, CAD, and ischemic cardiomyopathy now s/p HVAD placement presents for VAD clinic followup.  Patient was admitted in 1/18 with PNA and acute on chronic systolic CHF.  He was markedly volume overloaded and also had low cardiac output requiring dobutamine use.  He had a right pleural effusion and underwent thoracentesis.  Fluid studies suggested a parapneumonic effusion.  He had cardiac cath, showing 3 vessel disease and markedly elevated filling pressures, right and left.  He was evaluated for CABG but turned down given comorbidities and lack of venous conduits.  We were able to wean him off dobutamine and discharged him with plan for outpatient PCI once he had recovered from the current hospitalization.   Admitted 01/28/16 - 02/03/16 after presenting for elective 2 vessel PCI of the LAD and OM of the circumflex. RHC at same time showed advanced HF with low cardiac index and severely elevated pressures. Diuresed with IV lasix, metolazone, and milrinone, but refused PICC line to follow coox/CVP. Underwent thoracentesis 02/01/16 with 1.8 L out. Overall diuresed 16 lbs. Discharge weight 214 lbs.  He was admitted in 4/18 with low output HF and severe NYHA class IV symptoms, started on milrinone.  Unable to titrate off milrinone and is now on at home.  He had a right chronic transudative effusion, PleurX catheter was placed.    He had  HVAD placed 05/30/16.  He was diuresed post-op and speed was increased to 3000.  It was later dropped to 2940.  Pleurx catheter was removed.    He was admitted in 7/18 with upper GI bleed.  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  He had 3 units PRBCs.  ASA was decreased from 325 to 81 daily and warfarin goal was decreased to 2-2.5.  Octreotide was started  but he developed profuse diarrhea that seems to have been related to octreotide.  This was stopped and he was started on danazol.    He has calf claudication on left after walking a moderate distance.  This is chronic and followed by [facility]. His left fem-pop graft has occluded, plan for medical management as symptoms are mild.  No pedal ulcerations.    Diarrhea improving off octreotide.  Generally doing well now.  We decreased torsemide to 40 mg bid with suction events, he seems to be doing ok on this dose.  Weight is down 9 lbs.  Rare suction events now, log file sent and looks ok.  No significant exertional dyspnea walking on flat ground.  Still smoking < 5 cigs/day.    Labs (1/18): K 4.2, creatinine 0.9, hgb 12.6 Labs (2/18): K 4.2, creatinine 1.23, hgb 12.4, BNP 321 Labs (4/18): hgbA1c 8.9, TSH elevated but free T4 normal.  K 3.2, creatinine 0.8 Labs (5/18): digoxin 0.4, K 3.4, creatinine 0.9 Labs (6/18): creatinine 0.67 => 0.98, LDH 209, hgb 8.9 => 9 Labs (7/18): K 2.9, creatinine 1.02, LDL 189, hgb 9 Labs (8/18): hgb 10 => 10.2, LDH 204, K 3.2, creatinine 0.9 => 1.04, INR 2.45, LDH 204 Labs (9/18): LDH 206, INR 3.26, K 3.5, creatinine 0.91, hgb 11.3  HVAD interrogation: See LVAD nurse's note above  PMH: 1. PAD: Bilateral fem-pop bypasses - Arterial dopplers (9/18) with occluded left fem-pop, right fem-pop patent.  2. CAD: Anterior MI 2005 => PCI to LAD followed by staged PCI to LCx and RCA.   - LHC (1/18): Diffuse up to 80% calcified stenosis throughout the proximal LAD.  Diffuse disease in distal LAD up to 50%.  Large OM1 totally occluded at the ostium but reconstituted via collaterals.  80% distal RCA stenosis involving the ostia of the PLV and PDA with about 70% stenosis. Patient had PCI with DES to proximal LAD and PTCA OM1 (unable to place stent).  3. Chronic systolic CHF: Ischemic cardiomyopathy.  St Jude ICD.  - Echo (1/18) with EF 20%, mildly decreased RV systolic function.  - RHC  (1/18) with mean RA 21, PA 69/31 mean 45, mean PCWP 27, CI 2.37, PVR 3.3 WU.  - Repeat RHC (1/18) with mean RA 16, mean PCWP 21, CI 1.92 - Echo (3/18) with EF 20-25%, mild MR, mild RV dilation with normal systolic function, PASP 47 mmHg.  - 4/18 admission with low output HF and milrinone gtt started (co-ox as low as 42%).  - RHC on milrinone (4/18): mean RA 5, PA 61/24 mean 40, mean PCWP 17, CI 2.1 Fick with PVR 5.1 WU, CI 1.84 thermo with PVR 5.9 WU.  - HVAD placement 05/30/16.  4. Carotid stenosis: Carotid dopplers (8/16) with 57-32% RICA, 20-25% LICA.   - Carotid dopplers (2/18) with 40-59% RICA stenosis.  5. Type II diabetes. 6. Hyperlipidemia. 7. HTN 8. COPD:  PFTs (5/18) with moderate to severe obstructive disease.  Active smoker. 9. Chronic right pleural effusion (transudative) with PleurX catheter.  10. Upper GI bleeding: 7/18, enteroscopy with  2 small bowel AVMs (treated).   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  Smokes < 5 cigs/day.  Occasional ETOH.   Family History  Problem Relation Age of Onset  . Diabetes Mother   . Coronary artery disease Father   . Heart disease Father        before age 78   ROS: All systems reviewed and negative except as per HPI.   Current Outpatient Prescriptions  Medication Sig Dispense Refill  . aspirin EC 81 MG EC tablet Take 1 tablet (81 mg total) by mouth daily. 30 tablet 6  . atorvastatin (LIPITOR) 40 MG tablet Take 1 tablet (40 mg total) by mouth daily. 90 tablet 3  . blood glucose meter kit and supplies Ultra Blue Kit Dispense based on patient and insurance preference. Use up to four times daily as directed. (FOR ICD-9 250.00, 250.01). 1 each 0  . danazol (DANOCRINE) 100 MG capsule Take 1 capsule (100 mg total) by mouth 2 (two) times daily. 60 capsule 6  . ezetimibe (ZETIA) 10 MG tablet Take 1 tablet (10 mg total) by mouth daily. 90 tablet 3  . ferrous [id number] C-folic acid (TRINSICON / FOLTRIN) capsule Take 1  capsule by mouth 2 (two) times daily between meals. 60 capsule 6  . gabapentin (NEURONTIN) 100 MG capsule Take 100 mg by mouth daily.    . insulin NPH-regular Human (NOVOLIN 70/30) (70-30) 100 UNIT/ML injection Inject 15 Units into the skin 3 (three) times daily.     Marland Kitchen losartan (COZAAR) 25 MG tablet Take 1 tablet (25 mg total) by mouth 2 (two) times daily. (Patient taking differently: Take 12.5 mg by mouth 2 (two) times daily. ) 60 tablet 6  . pantoprazole (PROTONIX) 40 MG tablet Take 1 tablet (40 mg total) by mouth 2 (two) times daily. 60 tablet 6  . potassium chloride SA (K-DUR,KLOR-CON) 20 MEQ tablet Take 2 tablets (40 mEq total) by mouth 2 (two) times daily. 90 tablet 3  . sildenafil (REVATIO) 20 MG tablet Take 1 tablet (20 mg total) by mouth 3 (three) times daily. 90 tablet 11  . torsemide (DEMADEX) 20 MG tablet Take 3 tablets (60 mg total) by mouth 2 (two) times daily. (Patient taking differently: Take 40 mg by mouth 2 (two) times daily. ) 180 tablet 3  . warfarin (COUMADIN) 5 MG tablet Take 7.5 mg (1 and 1/2 tablets) daily except 5 mg (1 tablet) on Thurs.     No current facility-administered medications for this encounter.    BP 119/67   Pulse 77   Resp 16   Ht 6' (1.829 m)   Wt 229 lb 9.6 oz (104.1 kg)   SpO2 98%   BMI 31.14 kg/m   MAP 86 General: NAD Neck: JVP 7-8cm, no thyromegaly or thyroid nodule.  Lungs: Clear to auscultation bilaterally with normal respiratory effort. CV: LVAD hum. 1+ ankle edema R>L. No carotid bruit. Unable to palpatepedal pulses.  Abdomen: Soft, nontender, no hepatosplenomegaly, no distention.  Skin: Venous stasis changes lower legs. Driveline site benign.  Neurologic: Alert and oriented x 3.  Psych: Normal affect. Extremities: No clubbing or cyanosis.  HEENT: Normal.   Wt Readings from Last 3 Encounters:  09/07/16 229 lb 9.6 oz (104.1 kg)  09/06/16 232 lb 3.2 oz (105.3 kg)  08/24/16 238 lb 3.2 oz (108 kg)   Assessment/Plan: 1. Chronic  systolic CHF: Ischemic cardiomyopathy.  Echo in 3/18 with EF 20-25%.  He did not have much  effect from 12/17 PCI in terms of symptoms or EF.  St Jude ICD.  He is now s/p HVAD placement.  Lavare cycle is on.  He remains mildly volume overloaded, NYHA class II.  Suspect he has a degree of RV failure and we will not get his CVP down to normal range.  Today, he looks near-euvolemic.  Less frequent suction alarms on less torsemide and weight is down 9 lbs.   - Continue torsemide 40 mg bid.     - Continue losartan 12.5 mg bid.  - Needs to do cardiac rehab => he is not ready yet.  - He is going to think about whether he would eventually want to have transplant evaluation. Will have to stop smoking.   2. CAD: 3 vessel CAD, s/p stent to proximal LAD and PTCA to subtotally occluded large OM1 in 1/18. No change in symptoms or EF with intervention.  - He was on Plavix for > 3 months post-intervention, now on warfarin + ASA.  - Continue atorvastatin 80 mg daily.   3. PAD: Stable claudication, notes with moderate walking on left.  Left fem-pop bypass occluded.  No pedal ulcerations.   - Medical management per Dr. Scot Dock.  - I encouraged him to try walking through the pain for at least a short distance.  - I encouraged him to quit smoking.   4. Carotid stenosis:  Followed at [facility].  5. R Pleural effusion: PleurX catheter in past.  6. Pulmonary arterial HTN: Continue Revatio 20 mg tid.  7. Smoking: I encouraged him to quit.   8. Upper GI bleeding: 7/18, small bowel AVMs.  ASA was decreased to 81 daily.  Warfarin INR goal is now 2-2.5.  He started monthly octreotide injections but had profuse diarrhea with octreotide after 1st injection and after 2nd injection with decreased dose.  He is now on danazol.   Loralie Champagne 09/07/2016

## 2016-09-10 ENCOUNTER — Telehealth (HOSPITAL_COMMUNITY): Payer: Self-pay | Admitting: *Deleted

## 2016-09-10 NOTE — Telephone Encounter (Signed)
-----   Message from Laurey Moralealton S McLean, MD sent at 09/10/2016 10:47 AM EDT ----- Regarding: RE: Ready for Cardiac Rehab yes ----- Message ----- From: Chelsea Ausarlton, Carlette B, RN Sent: 09/08/2016   4:59 PM To: Laurey Moralealton S McLean, MD Subject: Ready for Cardiac Rehab                        Dr. Shirlee LatchMclean,  I have been following the above pt since his LVAD implantation 5/29 for readiness to participate in cardiac rehab again. Pt completed 10 sessions prior to his surgery in May. Looks as though he is making great progress.  Based upon his last office visit with you on 9/6, do you believe pt is ready to participate in Cardiac Rehab? Pt has an unusual clause in his medical insurance - Life time of 36 sessions.  He will only then have 26 remaining exercise sessions so I want to make the best use of this time and wait until he is ready.  Thanks Pharmacist, hospitalCarlette Carlton RN, BSN Cardiac and Emergency planning/management officerulmonary Rehab Nurse Navigator

## 2016-09-13 ENCOUNTER — Other Ambulatory Visit (HOSPITAL_COMMUNITY): Payer: Self-pay | Admitting: *Deleted

## 2016-09-13 ENCOUNTER — Telehealth (HOSPITAL_COMMUNITY): Payer: Self-pay

## 2016-09-13 ENCOUNTER — Telehealth (HOSPITAL_COMMUNITY): Payer: Self-pay | Admitting: Surgery

## 2016-09-13 ENCOUNTER — Telehealth (HOSPITAL_COMMUNITY): Payer: Self-pay | Admitting: *Deleted

## 2016-09-13 ENCOUNTER — Ambulatory Visit (HOSPITAL_COMMUNITY): Payer: Self-pay | Admitting: Pharmacist

## 2016-09-13 ENCOUNTER — Ambulatory Visit (HOSPITAL_COMMUNITY)
Admission: RE | Admit: 2016-09-13 | Discharge: 2016-09-13 | Disposition: A | Discharge status: Home / Self Care | Payer: BLUE CROSS/BLUE SHIELD | Source: Ambulatory Visit | Attending: Cardiology | Admitting: Cardiology

## 2016-09-13 DIAGNOSIS — Z95811 Presence of heart assist device: Secondary | ICD-10-CM

## 2016-09-13 LAB — PROTIME-INR
INR: 4.47 — AB
PROTHROMBIN TIME: 42.2 s — AB (ref 11.4–15.2)
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
## 2016-09-13 NOTE — Addendum Note (Signed)
Addended by: Elvin SoNICOLSEN, Jacee Enerson K on: 09/13/2016 04:11 PM   Modules accepted: Orders

## 2016-09-13 NOTE — Telephone Encounter (Signed)
I called patient to discuss scheduling for cardiac rehab. Patient declined at this time and stated that he is currently exercising and has a lot going on. Patient stated he will contact us. Referral closed.

## 2016-09-14 ENCOUNTER — Other Ambulatory Visit (HOSPITAL_COMMUNITY): Payer: Self-pay

## 2016-09-20 ENCOUNTER — Ambulatory Visit (HOSPITAL_COMMUNITY)
Admission: RE | Admit: 2016-09-20 | Discharge: 2016-09-20 | Disposition: A | Discharge status: Home / Self Care | Payer: BLUE CROSS/BLUE SHIELD | Source: Ambulatory Visit | Attending: Internal Medicine | Admitting: Internal Medicine

## 2016-09-20 ENCOUNTER — Ambulatory Visit (HOSPITAL_COMMUNITY): Payer: Self-pay | Admitting: Pharmacist

## 2016-09-20 DIAGNOSIS — Z95811 Presence of heart assist device: Secondary | ICD-10-CM

## 2016-09-20 LAB — PROTIME-INR
INR: 1.63
Prothrombin Time: 19.2 seconds — ABNORMAL HIGH (ref 11.4–15.2)

## 2016-09-22 ENCOUNTER — Ambulatory Visit (HOSPITAL_COMMUNITY): Payer: Self-pay | Admitting: Pharmacist

## 2016-09-22 DIAGNOSIS — Z95811 Presence of heart assist device: Secondary | ICD-10-CM

## 2016-09-22 LAB — POCT INR: INR: 2.5

## 2016-09-25 ENCOUNTER — Other Ambulatory Visit (HOSPITAL_COMMUNITY): Payer: Self-pay | Admitting: Unknown Physician Specialty

## 2016-09-25 ENCOUNTER — Telehealth (HOSPITAL_COMMUNITY): Payer: Self-pay | Admitting: *Deleted

## 2016-09-25 DIAGNOSIS — Z95811 Presence of heart assist device: Secondary | ICD-10-CM

## 2016-09-25 DIAGNOSIS — Z7901 Long term (current) use of anticoagulants: Secondary | ICD-10-CM

## 2016-09-25 NOTE — Telephone Encounter (Signed)
Patient made aware of [employer] Device Correction which applies to [employer] XS PT test strips that are used with all [employer] patient self-testing instruments. Plans made to have INR checked at our clinic or local laboratory until replacement test strips are delivered. Informed patient that md-INR will be contacting them with further details and instructions.   Nimai Burbach RN, VAD Coordinator 24/7 pager [phone]   

## 2016-09-26 ENCOUNTER — Ambulatory Visit (HOSPITAL_COMMUNITY)
Admission: RE | Admit: 2016-09-26 | Discharge: 2016-09-26 | Disposition: A | Discharge status: Home / Self Care | Payer: BLUE CROSS/BLUE SHIELD | Source: Ambulatory Visit | Attending: Cardiology | Admitting: Cardiology

## 2016-09-26 ENCOUNTER — Ambulatory Visit (HOSPITAL_COMMUNITY): Payer: Self-pay | Admitting: Pharmacist

## 2016-09-26 DIAGNOSIS — Z95811 Presence of heart assist device: Secondary | ICD-10-CM

## 2016-09-26 DIAGNOSIS — Z7901 Long term (current) use of anticoagulants: Secondary | ICD-10-CM | POA: Insufficient documentation

## 2016-09-26 LAB — PROTIME-INR
INR: 3.19
PROTHROMBIN TIME: 32.5 s — AB (ref 11.4–15.2)

## 2016-09-28 ENCOUNTER — Encounter (HOSPITAL_COMMUNITY): Payer: Self-pay | Admitting: *Deleted

## 2016-09-28 NOTE — Progress Notes (Signed)
Received medical records request from [facility] on 09/25/2016 CASE # [id number]  Requested records mailed today to: ([employer] [facility], [address], [place] [address])  Original request will be scanned to patient's electronic medical record.

## 2016-10-12 ENCOUNTER — Other Ambulatory Visit (HOSPITAL_COMMUNITY): Payer: Self-pay | Admitting: *Deleted

## 2016-10-12 ENCOUNTER — Other Ambulatory Visit (HOSPITAL_COMMUNITY): Payer: Self-pay | Admitting: Unknown Physician Specialty

## 2016-10-12 ENCOUNTER — Ambulatory Visit (HOSPITAL_COMMUNITY): Payer: Self-pay | Admitting: Pharmacist

## 2016-10-12 ENCOUNTER — Ambulatory Visit (HOSPITAL_COMMUNITY)
Admission: RE | Admit: 2016-10-12 | Discharge: 2016-10-12 | Disposition: A | Discharge status: Home / Self Care | Payer: BLUE CROSS/BLUE SHIELD | Source: Ambulatory Visit | Attending: Cardiology | Admitting: Cardiology

## 2016-10-12 DIAGNOSIS — I252 Old myocardial infarction: Secondary | ICD-10-CM | POA: Diagnosis not present

## 2016-10-12 DIAGNOSIS — I2721 Secondary pulmonary arterial hypertension: Secondary | ICD-10-CM | POA: Diagnosis not present

## 2016-10-12 DIAGNOSIS — Z833 Family history of diabetes mellitus: Secondary | ICD-10-CM | POA: Diagnosis not present

## 2016-10-12 DIAGNOSIS — Z7982 Long term (current) use of aspirin: Secondary | ICD-10-CM | POA: Insufficient documentation

## 2016-10-12 DIAGNOSIS — E1151 Type 2 diabetes mellitus with diabetic peripheral angiopathy without gangrene: Secondary | ICD-10-CM | POA: Insufficient documentation

## 2016-10-12 DIAGNOSIS — Z7901 Long term (current) use of anticoagulants: Secondary | ICD-10-CM | POA: Insufficient documentation

## 2016-10-12 DIAGNOSIS — I5023 Acute on chronic systolic (congestive) heart failure: Secondary | ICD-10-CM | POA: Diagnosis not present

## 2016-10-12 DIAGNOSIS — Z7902 Long term (current) use of antithrombotics/antiplatelets: Secondary | ICD-10-CM | POA: Diagnosis not present

## 2016-10-12 DIAGNOSIS — E785 Hyperlipidemia, unspecified: Secondary | ICD-10-CM | POA: Insufficient documentation

## 2016-10-12 DIAGNOSIS — I2510 Atherosclerotic heart disease of native coronary artery without angina pectoris: Secondary | ICD-10-CM | POA: Diagnosis not present

## 2016-10-12 DIAGNOSIS — Z95811 Presence of heart assist device: Secondary | ICD-10-CM

## 2016-10-12 DIAGNOSIS — I5022 Chronic systolic (congestive) heart failure: Secondary | ICD-10-CM | POA: Insufficient documentation

## 2016-10-12 DIAGNOSIS — I110 Hypertensive heart disease with heart failure: Secondary | ICD-10-CM | POA: Insufficient documentation

## 2016-10-12 DIAGNOSIS — Z79899 Other long term (current) drug therapy: Secondary | ICD-10-CM | POA: Diagnosis not present

## 2016-10-12 DIAGNOSIS — R42 Dizziness and giddiness: Secondary | ICD-10-CM | POA: Diagnosis not present

## 2016-10-12 DIAGNOSIS — K31811 Angiodysplasia of stomach and duodenum with bleeding: Secondary | ICD-10-CM

## 2016-10-12 DIAGNOSIS — Z95810 Presence of automatic (implantable) cardiac defibrillator: Secondary | ICD-10-CM

## 2016-10-12 DIAGNOSIS — I255 Ischemic cardiomyopathy: Secondary | ICD-10-CM | POA: Diagnosis not present

## 2016-10-12 DIAGNOSIS — I6523 Occlusion and stenosis of bilateral carotid arteries: Secondary | ICD-10-CM | POA: Insufficient documentation

## 2016-10-12 DIAGNOSIS — Z8249 Family history of ischemic heart disease and other diseases of the circulatory system: Secondary | ICD-10-CM | POA: Diagnosis not present

## 2016-10-12 DIAGNOSIS — I509 Heart failure, unspecified: Secondary | ICD-10-CM

## 2016-10-12 DIAGNOSIS — J449 Chronic obstructive pulmonary disease, unspecified: Secondary | ICD-10-CM | POA: Diagnosis not present

## 2016-10-12 DIAGNOSIS — Z794 Long term (current) use of insulin: Secondary | ICD-10-CM | POA: Diagnosis not present

## 2016-10-12 DIAGNOSIS — Z955 Presence of coronary angioplasty implant and graft: Secondary | ICD-10-CM | POA: Diagnosis not present

## 2016-10-12 LAB — CBC
HCT: 35.7 % — ABNORMAL LOW (ref 39.0–52.0)
HEMOGLOBIN: 11.4 g/dL — AB (ref 13.0–17.0)
MCH: 28.1 pg (ref 26.0–34.0)
MCHC: 31.9 g/dL (ref 30.0–36.0)
MCV: 88.1 fL (ref 78.0–100.0)
Platelets: 156 10*3/uL (ref 150–400)
RBC: 4.05 MIL/uL — AB (ref 4.22–5.81)
RDW: 16.4 % — ABNORMAL HIGH (ref 11.5–15.5)
WBC: 7.3 10*3/uL (ref 4.0–10.5)

## 2016-10-12 LAB — BASIC METABOLIC PANEL
ANION GAP: 10 (ref 5–15)
BUN: 11 mg/dL (ref 6–20)
CHLORIDE: 96 mmol/L — AB (ref 101–111)
CO2: 29 mmol/L (ref 22–32)
CREATININE: 1.12 mg/dL (ref 0.61–1.24)
Calcium: 8.8 mg/dL — ABNORMAL LOW (ref 8.9–10.3)
GFR calc Af Amer: >60 mL/min (ref >60)
GFR calc non Af Amer: >60 mL/min (ref >60)
Glucose, Bld: 176 mg/dL — ABNORMAL HIGH (ref 65–99)
Potassium: 3.1 mmol/L — ABNORMAL LOW (ref 3.5–5.1)
Sodium: 135 mmol/L (ref 135–145)

## 2016-10-12 LAB — PROTIME-INR
INR: 2.79
Prothrombin Time: 29.2 seconds — ABNORMAL HIGH (ref 11.4–15.2)

## 2016-10-12 LAB — LACTATE DEHYDROGENASE: LDH: 197 U/L — ABNORMAL HIGH (ref 98–192)

## 2016-10-12 MED ORDER — TORSEMIDE 20 MG PO TABS: 40.0000 mg | ORAL_TABLET | Freq: Two times a day (BID) | ORAL | 3 refills | Status: DC

## 2016-10-12 MED ORDER — FE FUMARATE-B12-VIT C-FA-IFC PO CAPS: 1.0000 | ORAL_CAPSULE | Freq: Two times a day (BID) | ORAL | 6 refills | Status: DC

## 2016-10-12 NOTE — Progress Notes (Addendum)
Patient presents for 1 month follow up in [facility] today. Reports no problems with VAD equipment or concerns with drive line.   Vital Signs:  Doppler Pressure 84   Automatc BP: 119/67 (90) HR: 90   SPO2:100  %  Weight: 231.6 lb w/o eqt Last weight: 229.6 lb Home weights: 230-235 lbs  VAD Indication: Destination Therapy - current smoker  VAD interrogation & Equipment Management (reviewed with Dr. Aundra Dubin): Speed: 2840 Flow: 5.3 Power:5.4 w    Peak/trough: 6.7/3.9 HCT:36 Alarm settings:3/7 Suction: On Lavare: On  Alarms: Pt has had 58 Suction alarms and 42 Low Flows since September 28, 2016. Autologs sent to [employer] clinical rep and reviewed with Dr. Aundra Dubin.   Exit Site Care: Drive line is being maintained weekly  by [employer]. Drive line exit site well healed and incorporated. The velour is fully implanted at exit site. Dressing dry and intact. No erythema or drainage. Stabilization device present and accurately applied. Pt denies fever or chills. Pt given 4 weekly dressing kits.   Significant Events on VAD Support:  05/31/16 - Controller power-up associated with pump start event. Controller change out performed.  06/01/16 - Controller pins lubricated per [employer] reps  06/17/16 - Controller power-up associated with pump start event while changing power source 06/22/16 - Controller pins re-lubricated per St Jude reps   Device:St Jude single lead- followed at device clinic Therapies: on at 200 bpm Last check: post implant   BP & Labs:  MAP 84 - Doppler is reflecting MAP  Hgb 11.4 - No S/S of bleeding. Specifically denies melena/BRBPR or nosebleeds.  LDH stable at 197 with established baseline of 190- 250. Denies tea-colored urine. No power elevations noted on interrogation.   Plan: 1. RTC in 1 month 2. Refill sent for iron and demadex. 3. Hold demadex today and tomorrow morning. 4. Decrease Demadex to 40 mg BID.   Tanda Rockers RN, BSN VAD Coordinator    Office: [phone] 24/7 Emergency VAD Pager: [phone]     PCP: Dr. Kathyrn Lass Cardiology: Dr. Stanford Breed HF Cardiology: Dr. Aundra Dubin  61 yo smoker with history of PAD, CAD, and ischemic cardiomyopathy now s/p HVAD placement presents for followup of CHF and LVAD.  Patient was admitted in 1/18 with PNA and acute on chronic systolic CHF.  He was markedly volume overloaded and also had low cardiac output requiring dobutamine use.  He had a right pleural effusion and underwent thoracentesis.  Fluid studies suggested a parapneumonic effusion.  He had cardiac cath, showing 3 vessel disease and markedly elevated filling pressures, right and left.  He was evaluated for CABG but turned down given comorbidities and lack of venous conduits.  We were able to wean him off dobutamine and discharged him with plan for outpatient PCI once he had recovered from the current hospitalization.   Admitted 01/28/16 - 02/03/16 after presenting for elective 2 vessel PCI of the LAD and OM of the circumflex. RHC at same time showed advanced HF with low cardiac index and severely elevated pressures. Diuresed with IV lasix, metolazone, and milrinone, but refused PICC line to follow coox/CVP. Underwent thoracentesis 02/01/16 with 1.8 L out. Overall diuresed 16 lbs. Discharge weight 214 lbs.  He was admitted in 4/18 with low output HF and severe NYHA class IV symptoms, started on milrinone.  Unable to titrate off milrinone and is now on at home.  He had a right chronic transudative effusion, PleurX catheter was placed.    He had HVAD placed 05/30/16.  He was diuresed post-op and speed was increased to 3000.  It was later dropped to 2940.  Pleurx catheter was removed.    He was admitted in 7/18 with upper GI bleed.  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  He had 3 units PRBCs.  ASA was decreased from 325 to 81 daily and warfarin goal was decreased to 2-2.5.  Octreotide was started but he developed profuse diarrhea  that seems to have been related to octreotide.  This was stopped and he was started on danazol.    He has calf claudication on left after walking a moderate distance.  This is chronic and followed by [facility]. His left fem-pop graft has occluded, plan for medical management as symptoms are mild.  No pedal ulcerations.    On LVAD interrogation today, Mr Waldman has had multiple suction events.  He feels lightheaded when these alarm.  Upon further questioning, it appears that he has been taking his torsemide 40 mg tid rather than bid.  Symptomatically, he is feeling well except when he has suction alarms.  Dyspnea only with long walks.  Able to walk up hill at his house without difficulty.  No chest pain.  No BRBPR/melena.  He is now on danazol. Weight is stable.  Mild right ankle edema.     Labs (1/18): K 4.2, creatinine 0.9, hgb 12.6 Labs (2/18): K 4.2, creatinine 1.23, hgb 12.4, BNP 321 Labs (4/18): hgbA1c 8.9, TSH elevated but free T4 normal.  K 3.2, creatinine 0.8 Labs (5/18): digoxin 0.4, K 3.4, creatinine 0.9 Labs (6/18): creatinine 0.67 => 0.98, LDH 209, hgb 8.9 => 9 Labs (7/18): K 2.9, creatinine 1.02, LDL 189, hgb 9 Labs (8/18): hgb 10 => 10.2, LDH 204, K 3.2, creatinine 0.9 => 1.04, INR 2.45, LDH 204 Labs (9/18): LDH 206, INR 3.26, K 3.5, creatinine 0.91, hgb 11.3 Labs (10/18): LDH 197, hgb 11.4, K 3.1, creatinine 1.12  HVAD interrogation: See LVAD nurse's note above  PMH: 1. PAD: Bilateral fem-pop bypasses - Arterial dopplers (9/18) with occluded left fem-pop, right fem-pop patent.  2. CAD: Anterior MI 2005 => PCI to LAD followed by staged PCI to LCx and RCA.   - LHC (1/18): Diffuse up to 80% calcified stenosis throughout the proximal LAD.  Diffuse disease in distal LAD up to 50%.  Large OM1 totally occluded at the ostium but reconstituted via collaterals.  80% distal RCA stenosis involving the ostia of the PLV and PDA with about 70% stenosis. Patient had PCI with DES to proximal LAD and PTCA  OM1 (unable to place stent).  3. Chronic systolic CHF: Ischemic cardiomyopathy.  St Jude ICD.  - Echo (1/18) with EF 20%, mildly decreased RV systolic function.  - RHC (1/18) with mean RA 21, PA 69/31 mean 45, mean PCWP 27, CI 2.37, PVR 3.3 WU.  - Repeat RHC (1/18) with mean RA 16, mean PCWP 21, CI 1.92 - Echo (3/18) with EF 20-25%, mild MR, mild RV dilation with normal systolic function, PASP 47 mmHg.  - 4/18 admission with low output HF and milrinone gtt started (co-ox as low as 42%).  - RHC on milrinone (4/18): mean RA 5, PA 61/24 mean 40, mean PCWP 17, CI 2.1 Fick with PVR 5.1 WU, CI 1.84 thermo with PVR 5.9 WU.  - HVAD placement 05/30/16.  4. Carotid stenosis: Carotid dopplers (8/16) with 20-94% RICA, 70-96% LICA.   - Carotid dopplers (2/18) with 40-59% RICA stenosis.  5. Type II diabetes. 6. Hyperlipidemia.  7. HTN 8. COPD:  PFTs (5/18) with moderate to severe obstructive disease.  Active smoker. 9. Chronic right pleural effusion (transudative) with PleurX catheter.  10. Upper GI bleeding: 7/18, enteroscopy with 2 small bowel AVMs (treated).   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  Smokes < 5 cigs/day.  Occasional ETOH.   Family History  Problem Relation Age of Onset  . Diabetes Mother   . Coronary artery disease Father   . Heart disease Father        before age 70   ROS: All systems reviewed and negative except as per HPI.   Current Outpatient Prescriptions  Medication Sig Dispense Refill  . aspirin EC 81 MG EC tablet Take 1 tablet (81 mg total) by mouth daily. 30 tablet 6  . atorvastatin (LIPITOR) 40 MG tablet Take 1 tablet (40 mg total) by mouth daily. 90 tablet 3  . blood glucose meter kit and supplies Ultra Blue Kit Dispense based on patient and insurance preference. Use up to four times daily as directed. (FOR ICD-9 250.00, 250.01). 1 each 0  . danazol (DANOCRINE) 100 MG capsule Take 1 capsule (100 mg total) by mouth 2 (two) times daily. 60 capsule 6  .  ezetimibe (ZETIA) 10 MG tablet Take 1 tablet (10 mg total) by mouth daily. 90 tablet 3  . ferrous [id number] C-folic acid (TRINSICON / FOLTRIN) capsule Take 1 capsule by mouth 2 (two) times daily between meals. 60 capsule 6  . insulin NPH-regular Human (NOVOLIN 70/30) (70-30) 100 UNIT/ML injection Inject 15 Units into the skin 3 (three) times daily.     Marland Kitchen losartan (COZAAR) 25 MG tablet Take 1 tablet (25 mg total) by mouth 2 (two) times daily. (Patient taking differently: Take 12.5 mg by mouth 2 (two) times daily. ) 60 tablet 6  . pantoprazole (PROTONIX) 40 MG tablet Take 1 tablet (40 mg total) by mouth 2 (two) times daily. (Patient taking differently: Take 40 mg by mouth once. ) 60 tablet 6  . potassium chloride SA (K-DUR,KLOR-CON) 20 MEQ tablet Take 2 tablets (40 mEq total) by mouth 2 (two) times daily. (Patient taking differently: Take 20 mEq by mouth 3 (three) times daily. ) 90 tablet 3  . sildenafil (REVATIO) 20 MG tablet Take 1 tablet (20 mg total) by mouth 3 (three) times daily. 90 tablet 11  . warfarin (COUMADIN) 5 MG tablet Take 5 mg (1 tablet) daily except 7.5 mg (1 and 1/2 tablets) on Mon.    . gabapentin (NEURONTIN) 100 MG capsule Take 100 mg by mouth daily.    Marland Kitchen torsemide (DEMADEX) 20 MG tablet Take 2 tablets (40 mg total) by mouth 2 (two) times daily. 180 tablet 3   No current facility-administered medications for this encounter.    BP (!) 84/0 Comment: doppler map  Pulse 90   Ht 6' (1.829 m)   Wt 231 lb 9.6 oz (105.1 kg)   SpO2 100%   BMI 31.41 kg/m   MAP 84 GENERAL: Well appearing this am. NAD.  HEENT: Normal. NECK: Supple, JVP 7-8 cm. Carotids OK.  CARDIAC:  Mechanical heart sounds with LVAD hum present.  LUNGS:  CTAB, normal effort.  ABDOMEN:  NT, ND, no HSM. No bruits or masses. +BS  LVAD exit site: Well-healed and incorporated. Dressing dry and intact. No erythema or drainage. Stabilization device present and accurately applied. Driveline dressing changed daily  per sterile technique. EXTREMITIES:  Warm and dry. No cyanosis, clubbing.  1+ right  ankle edema, trace left ankle edema. Venous stasis changes bilateral lower legs.   NEUROLOGIC:  Alert & oriented x 3. Cranial nerves grossly intact. Moves all 4 extremities w/o difficulty. Affect pleasant    Wt Readings from Last 3 Encounters:  10/12/16 231 lb 9.6 oz (105.1 kg)  09/07/16 229 lb 9.6 oz (104.1 kg)  09/06/16 232 lb 3.2 oz (105.3 kg)   Assessment/Plan: 1. Chronic systolic CHF: Ischemic cardiomyopathy.  Echo in 3/18 with EF 20-25%.  He did not have much effect from 12/17 PCI in terms of symptoms or EF.  St Jude ICD.  He is now s/p HVAD placement.  Lavare cycle is on.  Suspect he has a degree of RV failure and will not tolerate CVP down to normal range.  He has been taking more torsemide (40 mg tid) than he is supposed to take and has been having suction alarms.  I suspect that this is due to overdiuresis.  He does not look volume overloaded. MAP controlled.  - Hold torsemide for 1 day then decrease to 40 mg bid.  Would allow mild volume excess with RV failure.  He will call us if this does not help decrease suction alarm frequency.    - Continue losartan 12.5 mg bid.  - Needs to do cardiac rehab => he is not ready yet.  - He is going to think about whether he would eventually want to have transplant evaluation. Will have to stop smoking.   2. CAD: 3 vessel CAD, s/p stent to proximal LAD and PTCA to subtotally occluded large OM1 in 1/18. No change in symptoms or EF with intervention.  - He was on Plavix for > 3 months post-intervention, now on warfarin + ASA.  - Continue atorvastatin 80 mg daily.   3. PAD: Stable claudication, notes with moderate walking on left.  Left fem-pop bypass occluded.  No pedal ulcerations.  Medical management per Dr. Scot Dock.  - I encouraged him to try walking through the pain for at least a short distance.  - I encouraged him to quit smoking.   4. Carotid stenosis:  Followed  at [facility].  5. R Pleural effusion: PleurX catheter in past.  6. Pulmonary arterial HTN: Continue Revatio 20 mg tid.  7. Smoking: I encouraged him to quit.   8. Upper GI bleeding: 7/18, small bowel AVMs.  ASA was decreased to 81 daily.  Warfarin INR goal is now 2-2.5.  He started monthly octreotide injections but had profuse diarrhea with octreotide after 1st injection and after 2nd injection with decreased dose.  He is tolerating danazol without problems.   Loralie Champagne 10/12/2016

## 2016-10-12 NOTE — Patient Instructions (Addendum)
1. Hold Demadex (torsemide) today and tomorrow morning. 2. Decrease Demadex to 2 tablets 40 mg twice daily.  3. Refill sent to [facility] for iron and Demadex.

## 2016-10-19 ENCOUNTER — Ambulatory Visit (HOSPITAL_COMMUNITY): Payer: Self-pay | Admitting: Pharmacist

## 2016-10-19 ENCOUNTER — Ambulatory Visit (HOSPITAL_COMMUNITY)
Admission: RE | Admit: 2016-10-19 | Discharge: 2016-10-19 | Disposition: A | Discharge status: Home / Self Care | Payer: BLUE CROSS/BLUE SHIELD | Source: Ambulatory Visit | Attending: Cardiology | Admitting: Cardiology

## 2016-10-19 DIAGNOSIS — Z7901 Long term (current) use of anticoagulants: Secondary | ICD-10-CM | POA: Insufficient documentation

## 2016-10-19 DIAGNOSIS — Z95811 Presence of heart assist device: Secondary | ICD-10-CM | POA: Insufficient documentation

## 2016-10-19 LAB — PROTIME-INR
INR: 2.17
PROTHROMBIN TIME: 24 s — AB (ref 11.4–15.2)

## 2016-10-25 ENCOUNTER — Other Ambulatory Visit: Payer: Self-pay | Admitting: *Deleted

## 2016-10-25 DIAGNOSIS — I6523 Occlusion and stenosis of bilateral carotid arteries: Secondary | ICD-10-CM

## 2016-11-02 ENCOUNTER — Ambulatory Visit (HOSPITAL_COMMUNITY)
Admission: RE | Admit: 2016-11-02 | Discharge: 2016-11-02 | Disposition: A | Discharge status: Home / Self Care | Payer: BLUE CROSS/BLUE SHIELD | Source: Ambulatory Visit | Attending: Internal Medicine | Admitting: Internal Medicine

## 2016-11-02 ENCOUNTER — Telehealth (HOSPITAL_COMMUNITY): Payer: Self-pay | Admitting: Pharmacist

## 2016-11-02 ENCOUNTER — Ambulatory Visit (HOSPITAL_COMMUNITY): Payer: Self-pay | Admitting: Pharmacist

## 2016-11-02 DIAGNOSIS — Z95811 Presence of heart assist device: Secondary | ICD-10-CM

## 2016-11-02 LAB — PROTIME-INR
INR: 2.43
Prothrombin Time: 26.3 seconds — ABNORMAL HIGH (ref 11.4–15.2)

## 2016-11-02 NOTE — Telephone Encounter (Signed)
[name] continues to have diarrhea on a daily basis and wanted to know if his medications could be causing this. The only medication that carries a moderate risk for this is atorvastatin and sildenafil but he has been on both of these for a few weeks. He states that he has had this diarrhea off and on since his LVAD placement in May. I have advised him to try a daily probiotic or fiber supplement to see if this would help.   [name], PharmD, BCPS, CPP Clinical Pharmacist Pager: [phone] Phone: [phone] 11/02/2016 2:17 PM

## 2016-11-13 ENCOUNTER — Other Ambulatory Visit (HOSPITAL_COMMUNITY): Payer: Self-pay | Admitting: *Deleted

## 2016-11-13 ENCOUNTER — Ambulatory Visit (HOSPITAL_COMMUNITY)
Admission: RE | Admit: 2016-11-13 | Discharge: 2016-11-13 | Disposition: A | Discharge status: Home / Self Care | Payer: BLUE CROSS/BLUE SHIELD | Source: Ambulatory Visit | Attending: Cardiology | Admitting: Cardiology

## 2016-11-13 ENCOUNTER — Ambulatory Visit (HOSPITAL_COMMUNITY): Payer: Self-pay | Admitting: Pharmacist

## 2016-11-13 DIAGNOSIS — K31811 Angiodysplasia of stomach and duodenum with bleeding: Secondary | ICD-10-CM

## 2016-11-13 DIAGNOSIS — E46 Unspecified protein-calorie malnutrition: Secondary | ICD-10-CM | POA: Diagnosis not present

## 2016-11-13 DIAGNOSIS — I2510 Atherosclerotic heart disease of native coronary artery without angina pectoris: Secondary | ICD-10-CM | POA: Insufficient documentation

## 2016-11-13 DIAGNOSIS — Z7902 Long term (current) use of antithrombotics/antiplatelets: Secondary | ICD-10-CM | POA: Diagnosis not present

## 2016-11-13 DIAGNOSIS — I509 Heart failure, unspecified: Secondary | ICD-10-CM | POA: Diagnosis not present

## 2016-11-13 DIAGNOSIS — I5022 Chronic systolic (congestive) heart failure: Secondary | ICD-10-CM | POA: Insufficient documentation

## 2016-11-13 DIAGNOSIS — Z8249 Family history of ischemic heart disease and other diseases of the circulatory system: Secondary | ICD-10-CM | POA: Diagnosis not present

## 2016-11-13 DIAGNOSIS — F17210 Nicotine dependence, cigarettes, uncomplicated: Secondary | ICD-10-CM | POA: Diagnosis not present

## 2016-11-13 DIAGNOSIS — E785 Hyperlipidemia, unspecified: Secondary | ICD-10-CM | POA: Diagnosis not present

## 2016-11-13 DIAGNOSIS — I252 Old myocardial infarction: Secondary | ICD-10-CM | POA: Insufficient documentation

## 2016-11-13 DIAGNOSIS — I255 Ischemic cardiomyopathy: Secondary | ICD-10-CM | POA: Insufficient documentation

## 2016-11-13 DIAGNOSIS — Z955 Presence of coronary angioplasty implant and graft: Secondary | ICD-10-CM | POA: Insufficient documentation

## 2016-11-13 DIAGNOSIS — I6523 Occlusion and stenosis of bilateral carotid arteries: Secondary | ICD-10-CM | POA: Diagnosis not present

## 2016-11-13 DIAGNOSIS — I2721 Secondary pulmonary arterial hypertension: Secondary | ICD-10-CM | POA: Diagnosis not present

## 2016-11-13 DIAGNOSIS — Z95811 Presence of heart assist device: Secondary | ICD-10-CM

## 2016-11-13 DIAGNOSIS — E1151 Type 2 diabetes mellitus with diabetic peripheral angiopathy without gangrene: Secondary | ICD-10-CM | POA: Diagnosis not present

## 2016-11-13 DIAGNOSIS — Z7901 Long term (current) use of anticoagulants: Secondary | ICD-10-CM

## 2016-11-13 DIAGNOSIS — Z95810 Presence of automatic (implantable) cardiac defibrillator: Secondary | ICD-10-CM | POA: Diagnosis not present

## 2016-11-13 DIAGNOSIS — Z9889 Other specified postprocedural states: Secondary | ICD-10-CM | POA: Insufficient documentation

## 2016-11-13 DIAGNOSIS — I5023 Acute on chronic systolic (congestive) heart failure: Secondary | ICD-10-CM

## 2016-11-13 DIAGNOSIS — I5043 Acute on chronic combined systolic (congestive) and diastolic (congestive) heart failure: Secondary | ICD-10-CM

## 2016-11-13 DIAGNOSIS — Z79899 Other long term (current) drug therapy: Secondary | ICD-10-CM | POA: Diagnosis not present

## 2016-11-13 DIAGNOSIS — Z794 Long term (current) use of insulin: Secondary | ICD-10-CM | POA: Insufficient documentation

## 2016-11-13 DIAGNOSIS — Z7982 Long term (current) use of aspirin: Secondary | ICD-10-CM | POA: Insufficient documentation

## 2016-11-13 DIAGNOSIS — J449 Chronic obstructive pulmonary disease, unspecified: Secondary | ICD-10-CM | POA: Diagnosis not present

## 2016-11-13 DIAGNOSIS — Z833 Family history of diabetes mellitus: Secondary | ICD-10-CM | POA: Diagnosis not present

## 2016-11-13 DIAGNOSIS — I110 Hypertensive heart disease with heart failure: Secondary | ICD-10-CM | POA: Diagnosis not present

## 2016-11-13 LAB — COMPREHENSIVE METABOLIC PANEL
ALK PHOS: 116 U/L (ref 38–126)
ALT: 36 U/L (ref 17–63)
AST: 25 U/L (ref 15–41)
Albumin: 3.3 g/dL — ABNORMAL LOW (ref 3.5–5.0)
Anion gap: 10 (ref 5–15)
BILIRUBIN TOTAL: 0.6 mg/dL (ref 0.3–1.2)
BUN: 10 mg/dL (ref 6–20)
CALCIUM: 8.8 mg/dL — AB (ref 8.9–10.3)
CO2: 27 mmol/L (ref 22–32)
CREATININE: 1.1 mg/dL (ref 0.61–1.24)
Chloride: 100 mmol/L — ABNORMAL LOW (ref 101–111)
GFR calc Af Amer: >60 mL/min (ref >60)
Glucose, Bld: 187 mg/dL — ABNORMAL HIGH (ref 65–99)
Potassium: 4 mmol/L (ref 3.5–5.1)
SODIUM: 137 mmol/L (ref 135–145)
TOTAL PROTEIN: 7.5 g/dL (ref 6.5–8.1)

## 2016-11-13 LAB — PREALBUMIN: Prealbumin: 25 mg/dL (ref 18–38)

## 2016-11-13 LAB — LACTATE DEHYDROGENASE: LDH: 196 U/L — ABNORMAL HIGH (ref 98–192)

## 2016-11-13 LAB — PROTIME-INR
INR: 1.92
Prothrombin Time: 21.8 seconds — ABNORMAL HIGH (ref 11.4–15.2)

## 2016-11-13 LAB — CBC
HEMATOCRIT: 33.2 % — AB (ref 39.0–52.0)
HEMOGLOBIN: 10.3 g/dL — AB (ref 13.0–17.0)
MCH: 28.2 pg (ref 26.0–34.0)
MCHC: 31 g/dL (ref 30.0–36.0)
MCV: 91 fL (ref 78.0–100.0)
Platelets: 208 10*3/uL (ref 150–400)
RBC: 3.65 MIL/uL — AB (ref 4.22–5.81)
RDW: 18 % — ABNORMAL HIGH (ref 11.5–15.5)
WBC: 7.5 10*3/uL (ref 4.0–10.5)

## 2016-11-13 MED ORDER — IRON 325 (65 FE) MG PO TABS: 1.0000 | ORAL_TABLET | Freq: Two times a day (BID) | ORAL | 11 refills | Status: DC

## 2016-11-13 MED ORDER — LOSARTAN POTASSIUM 25 MG PO TABS: 25.0000 mg | ORAL_TABLET | Freq: Two times a day (BID) | ORAL | 6 refills | Status: DC

## 2016-11-13 MED ORDER — TORSEMIDE 20 MG PO TABS: 60.0000 mg | ORAL_TABLET | Freq: Two times a day (BID) | ORAL | 3 refills | Status: DC

## 2016-11-13 MED ORDER — DANAZOL 100 MG PO CAPS: 100.0000 mg | ORAL_CAPSULE | Freq: Two times a day (BID) | ORAL | 6 refills | Status: DC

## 2016-11-13 NOTE — Progress Notes (Addendum)
Patient presents for 1 month follow up in [facility] today. Reports no problems with VAD equipment or concerns with drive line.   Vital Signs:  Doppler Pressure 130  Automatc BP:  113/92 (100) HR: 94 SPO2: 99  %  Weight: 245.8 lb w/o eqt Last weight: 231.6  lb Home weights: 235 - 245 lbs over last 14 days  VAD Indication: Destination Therapy - current smoker  VAD interrogation & Equipment Management (reviewed with Dr. Aundra Dubin): Speed: 2840 Flow: 4.9 Power: 5.3w    Peak/trough: 6.8/2.8 HCT: 33 Alarm settings:3/7 Suction: On Lavare: On  No alarms since decreasing Torsemide to 40 mg bid. Pt has had 15 lb wt gain over last 14 day. He does report some pedal edema tody.   Exit Site Care: Drive line is being maintained weekly  by [employer]. Drive line dressing changed using sterile technique. Drive line exit site well healed and incorporated. The velour is fully implanted at exit site. Dressing dry and intact. No erythema or drainage. Stabilization device present and accurately applied. Pt denies fever or chills. Pt given 4 weekly dressing kits.   Significant Events on VAD Support:  05/31/16 - Controller power-up associated with pump start event. Controller change out performed.  06/01/16 - Controller pins lubricated per St Jude reps  06/17/16 - Controller power-up associated with pump start event while changing power source 06/22/16 - Controller pins re-lubricated per [facility] reps   Device:St Jude single lead- followed at device clinic Therapies: on at 200 bpm Last check: 05/17/16   BP & Labs:  Doppler BP 130 - Doppler is reflecting modified systolic   Hgb 56.3  - No S/S of bleeding. Specifically denies melena/BRBPR or nosebleeds.  LDH stable at 196 with established baseline of 190- 250. Denies tea-colored urine. No power elevations noted on interrogation.   6 mo Intermacs follow up completed including: Quality of Life, KCCQ-12, and Neurocognitive trail making.   Pt  completed 1100 feet during 6 minute walk. Limited by lower leg pain only.    Patient Instructions:   1. Take torsemide 3 pills (60 mg) twice daily for 2 days. Then take 3 pills (60 mg) in am and 2 pills (40 mg) in evening.  2. Increase Losartan to 25 mg twice daily. 3. Increase protein intake 4. Check CBC with next INR check. 5. Return to [place] [facility] in one month.   Zada Girt RN, BSN VAD Coordinator   Office: [phone] 24/7 Emergency VAD Pager: [phone]    PCP: Dr. Kathyrn Lass Cardiology: Dr. Stanford Breed HF Cardiology: Dr. Aundra Dubin  61 y.o. smoker with history of PAD, CAD, and ischemic cardiomyopathy now s/p HVAD placement presents for followup of CHF and LVAD.  Patient was admitted in 1/18 with PNA and acute on chronic systolic CHF.  He was markedly volume overloaded and also had low cardiac output requiring dobutamine use.  He had a right pleural effusion and underwent thoracentesis.  Fluid studies suggested a parapneumonic effusion.  He had cardiac cath, showing 3 vessel disease and markedly elevated filling pressures, right and left.  He was evaluated for CABG but turned down given comorbidities and lack of venous conduits.  We were able to wean him off dobutamine and discharged him with plan for outpatient PCI once he had recovered from the current hospitalization.   Admitted 01/28/16 - 02/03/16 after presenting for elective 2 vessel PCI of the LAD and OM of the circumflex. RHC at same time showed advanced HF with low cardiac index and severely  elevated pressures. Diuresed with IV lasix, metolazone, and milrinone, but refused PICC line to follow coox/CVP. Underwent thoracentesis 02/01/16 with 1.8 L out. Overall diuresed 16 lbs. Discharge weight 214 lbs.  He was admitted in 4/18 with low output HF and severe NYHA class IV symptoms, started on milrinone.  Unable to titrate off milrinone and is now on at home.  He had a right chronic transudative effusion, PleurX catheter was placed.    He had  HVAD placed 05/30/16.  He was diuresed post-op and speed was increased to 3000.  It was later dropped to 2940.  Pleurx catheter was removed.    He was admitted in 7/18 with upper GI bleed.  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  He had 3 units PRBCs.  ASA was decreased from 325 to 81 daily and warfarin goal was decreased to 2-2.5.  Octreotide was started but he developed profuse diarrhea that seems to have been related to octreotide.  This was stopped and he was started on danazol.    He has calf claudication on left after walking a moderate distance.  This is chronic and followed by [facility]. His left fem-pop graft has occluded, plan for medical management as symptoms are mild.  No pedal ulcerations.    At last appointment, Mr Lowdermilk was having multiple suction alarms.  This was thought to be due to over-diuresis.  Torsemide was cut back to 40 mg bid. Since then, weight is up 14 lbs.  He has had no further suction alarms.  He walks about 1/2 mile/day. Still smoking some.  No significant exertional dyspnea (limited more by claudication).  He saw GI recently and stool was heme negative.   St Jude device interrogation: thoracic impedance decreased.   Labs (1/18): K 4.2, creatinine 0.9, hgb 12.6 Labs (2/18): K 4.2, creatinine 1.23, hgb 12.4, BNP 321 Labs (4/18): hgbA1c 8.9, TSH elevated but free T4 normal.  K 3.2, creatinine 0.8 Labs (5/18): digoxin 0.4, K 3.4, creatinine 0.9 Labs (6/18): creatinine 0.67 => 0.98, LDH 209, hgb 8.9 => 9 Labs (7/18): K 2.9, creatinine 1.02, LDL 189, hgb 9 Labs (8/18): hgb 10 => 10.2, LDH 204, K 3.2, creatinine 0.9 => 1.04, INR 2.45, LDH 204 Labs (9/18): LDH 206, INR 3.26, K 3.5, creatinine 0.91, hgb 11.3 Labs (10/18): LDH 197, hgb 11.4, K 3.1, creatinine 1.12 Labs (11/18): Hgb 10.3, K 4, creatinine 1.0, LDH 196  HVAD interrogation: See LVAD nurse's note above  PMH: 1. PAD: Bilateral fem-pop bypasses - Arterial dopplers (9/18) with occluded left  fem-pop, right fem-pop patent.  2. CAD: Anterior MI 2005 => PCI to LAD followed by staged PCI to LCx and RCA.   - LHC (1/18): Diffuse up to 80% calcified stenosis throughout the proximal LAD.  Diffuse disease in distal LAD up to 50%.  Large OM1 totally occluded at the ostium but reconstituted via collaterals.  80% distal RCA stenosis involving the ostia of the PLV and PDA with about 70% stenosis. Patient had PCI with DES to proximal LAD and PTCA OM1 (unable to place stent).  3. Chronic systolic CHF: Ischemic cardiomyopathy.  St Jude ICD.  - Echo (1/18) with EF 20%, mildly decreased RV systolic function.  - RHC (1/18) with mean RA 21, PA 69/31 mean 45, mean PCWP 27, CI 2.37, PVR 3.3 WU.  - Repeat RHC (1/18) with mean RA 16, mean PCWP 21, CI 1.92 - Echo (3/18) with EF 20-25%, mild MR, mild RV dilation with normal systolic function,  PASP 47 mmHg.  - 4/18 admission with low output HF and milrinone gtt started (co-ox as low as 42%).  - RHC on milrinone (4/18): mean RA 5, PA 61/24 mean 40, mean PCWP 17, CI 2.1 Fick with PVR 5.1 WU, CI 1.84 thermo with PVR 5.9 WU.  - HVAD placement 05/30/16.  4. Carotid stenosis: Carotid dopplers (8/16) with 86-76% RICA, 72-09% LICA.   - Carotid dopplers (2/18) with 40-59% RICA stenosis.  5. Type II diabetes. 6. Hyperlipidemia. 7. HTN 8. COPD:  PFTs (5/18) with moderate to severe obstructive disease.  Active smoker. 9. Chronic right pleural effusion (transudative) with PleurX catheter.  10. Upper GI bleeding: 7/18, enteroscopy with 2 small bowel AVMs (treated).   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  Smokes < 5 cigs/day.  Occasional ETOH.   Family History  Problem Relation Age of Onset  . Diabetes Mother   . Coronary artery disease Father   . Heart disease Father        before age 51   ROS: All systems reviewed and negative except as per HPI.   Current Outpatient Medications  Medication Sig Dispense Refill  . aspirin EC 81 MG EC tablet  Take 1 tablet (81 mg total) by mouth daily. 30 tablet 6  . atorvastatin (LIPITOR) 40 MG tablet Take 1 tablet (40 mg total) by mouth daily. 90 tablet 3  . blood glucose meter kit and supplies Ultra Blue Kit Dispense based on patient and insurance preference. Use up to four times daily as directed. (FOR ICD-9 250.00, 250.01). 1 each 0  . ezetimibe (ZETIA) 10 MG tablet Take 1 tablet (10 mg total) by mouth daily. 90 tablet 3  . insulin NPH-regular Human (NOVOLIN 70/30) (70-30) 100 UNIT/ML injection Inject 15 Units into the skin 3 (three) times daily.     . pantoprazole (PROTONIX) 40 MG tablet Take 1 tablet (40 mg total) by mouth 2 (two) times daily. (Patient taking differently: Take 40 mg by mouth once. ) 60 tablet 6  . potassium chloride SA (K-DUR,KLOR-CON) 20 MEQ tablet Take 2 tablets (40 mEq total) by mouth 2 (two) times daily. (Patient taking differently: Take 20 mEq by mouth 3 (three) times daily. ) 90 tablet 3  . sildenafil (REVATIO) 20 MG tablet Take 1 tablet (20 mg total) by mouth 3 (three) times daily. 90 tablet 11  . torsemide (DEMADEX) 20 MG tablet Take 3 tablets (60 mg total) 2 (two) times daily by mouth. 180 tablet 3  . warfarin (COUMADIN) 5 MG tablet Take 5 mg (1 tablet) daily except 7.5 mg (1 and 1/2 tablets) on Mon.    . danazol (DANOCRINE) 100 MG capsule Take 1 capsule (100 mg total) 2 (two) times daily by mouth. 60 capsule 6  . Ferrous Sulfate (IRON) 325 (65 Fe) MG TABS Take 1 tablet (325 mg total) 2 (two) times daily by mouth. 60 each 11  . gabapentin (NEURONTIN) 100 MG capsule Take 100 mg by mouth daily.    Marland Kitchen losartan (COZAAR) 25 MG tablet Take 1 tablet (25 mg total) 2 (two) times daily by mouth. 60 tablet 6   No current facility-administered medications for this encounter.    BP (!) 124/0 Comment: Left arm Doppler modified systolic - pt has LVAD  Pulse 94   Ht 6' (1.829 m)   Wt 245 lb 12.8 oz (111.5 kg)   SpO2 99%   BMI 33.34 kg/m   MAP 100 GENERAL: Well appearing this am.  NAD.  HEENT: Normal. NECK: Supple, JVP 12 cm. Carotids OK.  CARDIAC:  Mechanical heart sounds with LVAD hum present.  LUNGS:  CTAB, normal effort.  ABDOMEN:  NT, ND, no HSM. No bruits or masses. +BS  LVAD exit site: Well-healed and incorporated. Dressing dry and intact. No erythema or drainage. Stabilization device present and accurately applied. Driveline dressing changed daily per sterile technique. EXTREMITIES:  Warm and dry. No cyanosis, clubbing, rash.  1+ edema to knees bilaterally.  NEUROLOGIC:  Alert & oriented x 3. Cranial nerves grossly intact. Moves all 4 extremities w/o difficulty. Affect pleasant   SKIN: Venous stasis changes lower legs.    Wt Readings from Last 3 Encounters:  11/13/16 245 lb 12.8 oz (111.5 kg)  10/12/16 231 lb 9.6 oz (105.1 kg)  09/07/16 229 lb 9.6 oz (104.1 kg)   Assessment/Plan: 1. Chronic systolic CHF: Ischemic cardiomyopathy.  Echo in 3/18 with EF 20-25%.  He did not have much effect from 12/17 PCI in terms of symptoms or EF.  St Jude ICD.  He is now s/p HVAD placement.  Lavare cycle is on.  Suspect he has a degree of RV failure and will not tolerate CVP down to normal range.  At last appointment, he was having suction alarms and we thought he was over-diuresed.  After decreasing torsemide, he has had no further suction alarms but weight is up and he is volume overloaded. We need to find a better medium range where he is not too dry and not too wet.  - Increase torsemide to 60 mg bid x 2 days then 60 qam/40 qpm.   - MAP elevated, increase losartan to 25 mg bid (increasing diuresis will also likely help).  - He is going to think about whether he would eventually want to have transplant evaluation. Will have to stop smoking.   2. CAD: 3 vessel CAD, s/p stent to proximal LAD and PTCA to subtotally occluded large OM1 in 1/18. No change in symptoms or EF with intervention.  - He was on Plavix for > 3 months post-intervention, now on warfarin + ASA.  - Continue  atorvastatin 80 mg daily.   3. PAD: Stable claudication, notes with moderate walking on left.  Left fem-pop bypass occluded.  No pedal ulcerations.  Medical management per Dr. Scot Dock.  - I encouraged him to try walking through the pain for at least a short distance.  - I encouraged him to quit smoking.   4. Carotid stenosis:  Followed at [facility].  5. R Pleural effusion: PleurX catheter in past.  6. Pulmonary arterial HTN: Continue Revatio 20 mg tid.  7. Smoking: I encouraged him to quit.   8. Upper GI bleeding: 7/18, small bowel AVMs.  ASA was decreased to 81 daily.  Warfarin INR goal is now 2-2.5.  He started monthly octreotide injections but had profuse diarrhea with octreotide after 1st injection and after 2nd injection with decreased dose.  He is tolerating danazol without problems.   I would like to seem him increase activity, would like him to walk 1 mile/day (up from 1/2 mile/day).   Loralie Champagne 11/13/2016

## 2016-11-13 NOTE — Addendum Note (Signed)
Encounter addended by: Laurey MoraleMcLean, Shenekia Riess S, MD on: 11/13/2016 11:38 PM  Actions taken: LOS modified

## 2016-11-13 NOTE — Patient Instructions (Signed)
1. Take torsemide 3 pills (60 mg) twice daily for 2 days. Then take 3 pills (60 mg) in am and 2 pills (40 mg) in evening.  2. Increase Losartan to 25 mg twice daily. 3. Increase protein intake 4. Check CBC with next INR check. 5. Return to VAD clinic in one month.

## 2016-11-13 NOTE — Addendum Note (Signed)
Addended by: Elvin SoNICOLSEN, Tashira Torre K on: 11/13/2016 01:23 PM   Modules accepted: Orders

## 2016-11-14 DIAGNOSIS — Z736 Limitation of activities due to disability: Secondary | ICD-10-CM | POA: Diagnosis not present

## 2016-11-16 ENCOUNTER — Ambulatory Visit (INDEPENDENT_AMBULATORY_CARE_PROVIDER_SITE_OTHER): Payer: BLUE CROSS/BLUE SHIELD | Admitting: *Deleted

## 2016-11-16 DIAGNOSIS — I5022 Chronic systolic (congestive) heart failure: Secondary | ICD-10-CM

## 2016-11-16 DIAGNOSIS — I255 Ischemic cardiomyopathy: Secondary | ICD-10-CM | POA: Diagnosis not present

## 2016-11-17 LAB — CUP PACEART INCLINIC DEVICE CHECK
Brady Statistic RV Percent Paced: 0 %
Date Time Interrogation Session: 20181115161810
HighPow Impedance: 45.913
Implantable Lead Model: 7001
Lead Channel Impedance Value: 375 Ohm
Lead Channel Pacing Threshold Amplitude: 2.25 V
Lead Channel Pacing Threshold Pulse Width: 1 ms
Lead Channel Setting Pacing Amplitude: 4 V
Lead Channel Setting Pacing Pulse Width: 1 ms
Lead Channel Setting Sensing Sensitivity: 0.5 mV
MDC IDC LEAD IMPLANT DT: 20070207
MDC IDC LEAD LOCATION: 753860
MDC IDC MSMT BATTERY REMAINING LONGEVITY: 70 mo
MDC IDC MSMT LEADCHNL RV PACING THRESHOLD AMPLITUDE: 2.25 V
MDC IDC MSMT LEADCHNL RV PACING THRESHOLD PULSEWIDTH: 1 ms
MDC IDC MSMT LEADCHNL RV SENSING INTR AMPL: 5 mV
MDC IDC PG IMPLANT DT: 20141215
MDC IDC PG SERIAL: 7155171

## 2016-11-17 NOTE — Progress Notes (Signed)
ICD check in clinic. Normal device function. Threshold and sensing consistent with previous device measurements. Impedance trends stable over time. No evidence of any ventricular arrhythmias. Histogram distribution appropriate for patient and level of activity. Abnormal thoracic impedance x 11days, no ShOB + chronic LE edema and abdominal distention - torsemide recently adjusted by CHF clinic. No changes made this session. Device programmed at appropriate safety margins. Device programmed to optimize intrinsic conduction. Estimated longevity 5 years 10months. Pt will follow up with DC in 3 months. Patient education completed including shock plan. Vibration demonstrated for patient.

## 2016-11-20 ENCOUNTER — Ambulatory Visit (HOSPITAL_COMMUNITY): Payer: Self-pay | Admitting: Pharmacist

## 2016-11-20 ENCOUNTER — Ambulatory Visit (HOSPITAL_COMMUNITY)
Admission: RE | Admit: 2016-11-20 | Discharge: 2016-11-20 | Disposition: A | Discharge status: Home / Self Care | Payer: BLUE CROSS/BLUE SHIELD | Source: Ambulatory Visit | Attending: Cardiology | Admitting: Cardiology

## 2016-11-20 DIAGNOSIS — Z95811 Presence of heart assist device: Secondary | ICD-10-CM

## 2016-11-20 DIAGNOSIS — Z7901 Long term (current) use of anticoagulants: Secondary | ICD-10-CM

## 2016-11-20 LAB — CBC
HEMATOCRIT: 36.3 % — AB (ref 39.0–52.0)
HEMOGLOBIN: 11.2 g/dL — AB (ref 13.0–17.0)
MCH: 27.7 pg (ref 26.0–34.0)
MCHC: 30.9 g/dL (ref 30.0–36.0)
MCV: 89.9 fL (ref 78.0–100.0)
Platelets: 160 10*3/uL (ref 150–400)
RBC: 4.04 MIL/uL — AB (ref 4.22–5.81)
RDW: 17.9 % — ABNORMAL HIGH (ref 11.5–15.5)
WBC: 8.7 10*3/uL (ref 4.0–10.5)

## 2016-11-20 LAB — PROTIME-INR
INR: 1.78
Prothrombin Time: 20.6 seconds — ABNORMAL HIGH (ref 11.4–15.2)

## 2016-11-30 ENCOUNTER — Other Ambulatory Visit (HOSPITAL_COMMUNITY): Payer: Self-pay | Admitting: *Deleted

## 2016-11-30 ENCOUNTER — Encounter (HOSPITAL_COMMUNITY): Payer: Self-pay

## 2016-11-30 DIAGNOSIS — Z7901 Long term (current) use of anticoagulants: Secondary | ICD-10-CM

## 2016-11-30 DIAGNOSIS — I5043 Acute on chronic combined systolic (congestive) and diastolic (congestive) heart failure: Secondary | ICD-10-CM

## 2016-11-30 DIAGNOSIS — Z95811 Presence of heart assist device: Secondary | ICD-10-CM

## 2016-11-30 NOTE — Progress Notes (Signed)
[employer] Leave of absence form completed and signed by Dr. Shirlee LatchMcLean to keep patient out of work through 06/2017. Faxed forms to provided fax # [phone] Copy of forms scanned into patient's electronic medical record.  [name], [name], RN

## 2016-12-04 ENCOUNTER — Ambulatory Visit (HOSPITAL_COMMUNITY)
Admission: RE | Admit: 2016-12-04 | Discharge: 2016-12-04 | Disposition: A | Discharge status: Home / Self Care | Payer: BLUE CROSS/BLUE SHIELD | Source: Ambulatory Visit | Attending: Cardiology | Admitting: Cardiology

## 2016-12-04 ENCOUNTER — Ambulatory Visit (HOSPITAL_COMMUNITY): Payer: Self-pay | Admitting: Pharmacist

## 2016-12-04 DIAGNOSIS — Z7901 Long term (current) use of anticoagulants: Secondary | ICD-10-CM | POA: Diagnosis not present

## 2016-12-04 DIAGNOSIS — Z95811 Presence of heart assist device: Secondary | ICD-10-CM

## 2016-12-04 DIAGNOSIS — I5043 Acute on chronic combined systolic (congestive) and diastolic (congestive) heart failure: Secondary | ICD-10-CM | POA: Diagnosis present

## 2016-12-04 LAB — PROTIME-INR
INR: 2.54
Prothrombin Time: 27.2 seconds — ABNORMAL HIGH (ref 11.4–15.2)

## 2016-12-04 LAB — LACTATE DEHYDROGENASE: LDH: 207 U/L — ABNORMAL HIGH (ref 98–192)

## 2016-12-13 ENCOUNTER — Other Ambulatory Visit (HOSPITAL_COMMUNITY): Payer: Self-pay | Admitting: *Deleted

## 2016-12-13 ENCOUNTER — Ambulatory Visit (HOSPITAL_COMMUNITY)
Admission: RE | Admit: 2016-12-13 | Discharge: 2016-12-13 | Disposition: A | Discharge status: Home / Self Care | Payer: BLUE CROSS/BLUE SHIELD | Source: Ambulatory Visit | Attending: Internal Medicine | Admitting: Internal Medicine

## 2016-12-13 ENCOUNTER — Encounter (HOSPITAL_COMMUNITY): Payer: Self-pay

## 2016-12-13 ENCOUNTER — Ambulatory Visit (HOSPITAL_COMMUNITY): Payer: Self-pay | Admitting: Pharmacist

## 2016-12-13 DIAGNOSIS — Z7902 Long term (current) use of antithrombotics/antiplatelets: Secondary | ICD-10-CM | POA: Diagnosis not present

## 2016-12-13 DIAGNOSIS — J449 Chronic obstructive pulmonary disease, unspecified: Secondary | ICD-10-CM | POA: Insufficient documentation

## 2016-12-13 DIAGNOSIS — I5022 Chronic systolic (congestive) heart failure: Secondary | ICD-10-CM | POA: Diagnosis not present

## 2016-12-13 DIAGNOSIS — Z955 Presence of coronary angioplasty implant and graft: Secondary | ICD-10-CM | POA: Diagnosis not present

## 2016-12-13 DIAGNOSIS — E785 Hyperlipidemia, unspecified: Secondary | ICD-10-CM | POA: Insufficient documentation

## 2016-12-13 DIAGNOSIS — Z95811 Presence of heart assist device: Secondary | ICD-10-CM

## 2016-12-13 DIAGNOSIS — I252 Old myocardial infarction: Secondary | ICD-10-CM | POA: Diagnosis not present

## 2016-12-13 DIAGNOSIS — Z833 Family history of diabetes mellitus: Secondary | ICD-10-CM | POA: Insufficient documentation

## 2016-12-13 DIAGNOSIS — I2721 Secondary pulmonary arterial hypertension: Secondary | ICD-10-CM | POA: Insufficient documentation

## 2016-12-13 DIAGNOSIS — Z7982 Long term (current) use of aspirin: Secondary | ICD-10-CM | POA: Diagnosis not present

## 2016-12-13 DIAGNOSIS — Z794 Long term (current) use of insulin: Secondary | ICD-10-CM | POA: Diagnosis not present

## 2016-12-13 DIAGNOSIS — Z79899 Other long term (current) drug therapy: Secondary | ICD-10-CM | POA: Insufficient documentation

## 2016-12-13 DIAGNOSIS — Z8249 Family history of ischemic heart disease and other diseases of the circulatory system: Secondary | ICD-10-CM | POA: Diagnosis not present

## 2016-12-13 DIAGNOSIS — I5043 Acute on chronic combined systolic (congestive) and diastolic (congestive) heart failure: Secondary | ICD-10-CM

## 2016-12-13 DIAGNOSIS — I255 Ischemic cardiomyopathy: Secondary | ICD-10-CM | POA: Diagnosis not present

## 2016-12-13 DIAGNOSIS — Z7901 Long term (current) use of anticoagulants: Secondary | ICD-10-CM | POA: Diagnosis not present

## 2016-12-13 DIAGNOSIS — I110 Hypertensive heart disease with heart failure: Secondary | ICD-10-CM | POA: Diagnosis present

## 2016-12-13 DIAGNOSIS — E1151 Type 2 diabetes mellitus with diabetic peripheral angiopathy without gangrene: Secondary | ICD-10-CM | POA: Diagnosis not present

## 2016-12-13 DIAGNOSIS — I2510 Atherosclerotic heart disease of native coronary artery without angina pectoris: Secondary | ICD-10-CM | POA: Insufficient documentation

## 2016-12-13 LAB — CBC
HEMATOCRIT: 34.1 % — AB (ref 39.0–52.0)
HEMOGLOBIN: 11 g/dL — AB (ref 13.0–17.0)
MCH: 28.1 pg (ref 26.0–34.0)
MCHC: 32.3 g/dL (ref 30.0–36.0)
MCV: 87 fL (ref 78.0–100.0)
Platelets: 198 10*3/uL (ref 150–400)
RBC: 3.92 MIL/uL — AB (ref 4.22–5.81)
RDW: 16.4 % — ABNORMAL HIGH (ref 11.5–15.5)
WBC: 9.4 10*3/uL (ref 4.0–10.5)

## 2016-12-13 LAB — BASIC METABOLIC PANEL
Anion gap: 12 (ref 5–15)
BUN: 21 mg/dL — AB (ref 6–20)
CHLORIDE: 97 mmol/L — AB (ref 101–111)
CO2: 26 mmol/L (ref 22–32)
Calcium: 8.8 mg/dL — ABNORMAL LOW (ref 8.9–10.3)
Creatinine, Ser: 1.34 mg/dL — ABNORMAL HIGH (ref 0.61–1.24)
GFR calc Af Amer: >60 mL/min (ref >60)
GFR calc non Af Amer: 55 mL/min — ABNORMAL LOW (ref >60)
Glucose, Bld: 177 mg/dL — ABNORMAL HIGH (ref 65–99)
POTASSIUM: 3.5 mmol/L (ref 3.5–5.1)
SODIUM: 135 mmol/L (ref 135–145)

## 2016-12-13 LAB — PROTIME-INR
INR: 2.85
Prothrombin Time: 29.7 seconds — ABNORMAL HIGH (ref 11.4–15.2)

## 2016-12-13 LAB — LACTATE DEHYDROGENASE: LDH: 210 U/L — ABNORMAL HIGH (ref 98–192)

## 2016-12-13 NOTE — Progress Notes (Signed)
Social security disability benefits letter of approval/notice of award faxed to [employer] attn: Tobey BrideDaniel Thomas (Disability case manager) at provided fax # [phone]. Copy of letter scanned into patient's electronic medical record and original given back to patient during VAD clinic appt.  [name], Girolamo Lortie [name], RN

## 2016-12-13 NOTE — Progress Notes (Addendum)
Patient presents for 1 month follow up in [facility] today. Reports no problems with VAD equipment or concerns with drive line.   Pt had ICD device check in device clinic on 11/16/16 with no arrhythmias noted. No programming changes made. Device has 5 yrs 10 mos battery life left.    Pt does report he has been taking Torsemide 60 mg am and pm with @ 3x week taking 80 mg in am and 60 mg pm.  Reports good urine output with dosing.   Also reports thick dark to dark brown stools. No active signs of bleeding  Vital Signs:  Doppler Pressure  96 Automatc BP:  90/76 (80) HR:  101 SPO2:  99%  Weight:  240  lb w/o eqt Last weight: 245.8 lb Home weights:  239 - 240 lbs  Home blood sugars have been running 150 - 260.  VAD Indication: Destination Therapy - current smoker  VAD interrogation & Equipment Management (reviewed with Dr. Aundra Dubin): Speed: 2840 Flow: 5.2 Power: 5.4w    Peak/trough: 3.7/7.0 HCT: 34 Alarm settings:3/7 Suction: On Lavare: On    Exit Site Care: Drive line is being maintained weekly  by [employer]. Drive line dressing changed using sterile technique. Drive line exit site well healed and incorporated. The velour is fully implanted at exit site. Dressing dry and intact. No erythema or drainage. Stabilization device present and accurately applied. Pt denies fever or chills. Pt given 4 weekly dressing kits.   Significant Events on VAD Support:  05/31/16 - Controller power-up associated with pump start event. Controller change out performed.  06/01/16 - Controller pins lubricated per St Jude reps  06/17/16 - Controller power-up associated with pump start event while changing power source 06/22/16 - Controller pins re-lubricated per [facility] reps   Device:St Jude single lead- followed at device clinic Therapies: on at 200 bpm Last check: 05/17/16   BP & Labs:  Doppler BP 96 - Doppler is reflecting modified systolic   Hgb 16.1 - No S/S of bleeding. Specifically  denies melena/BRBPR or nosebleeds.  LDH pending; established baseline of 190- 250. Denies tea-colored urine. No power elevations noted on interrogation.    Patient Instructions:  1.  No change in current medications. 2.  Will call you if any abnormal labs. 3.  Decrease coumadin to 5 mg 4 times weekly and 7.5 mg three times weekly. 4.  Use nasal spray and add humidifier for room to help reduce nosebleeds. 5.  Return to [facility] in one month.  Zada Girt RN, BSN VAD Coordinator   Office: [phone] 24/7 Emergency VAD Pager: [phone]   PCP: Dr. Kathyrn Lass Cardiology: Dr. Stanford Breed HF Cardiology: Dr. Aundra Dubin  61 y.o. smoker with history of PAD, CAD, and ischemic cardiomyopathy now s/p HVAD placement presents for followup of CHF and LVAD.  Patient was admitted in 1/18 with PNA and acute on chronic systolic CHF.  He was markedly volume overloaded and also had low cardiac output requiring dobutamine use.  He had a right pleural effusion and underwent thoracentesis.  Fluid studies suggested a parapneumonic effusion.  He had cardiac cath, showing 3 vessel disease and markedly elevated filling pressures, right and left.  He was evaluated for CABG but turned down given comorbidities and lack of venous conduits.  We were able to wean him off dobutamine and discharged him with plan for outpatient PCI once he had recovered from the current hospitalization.   Admitted 01/28/16 - 02/03/16 after presenting for elective 2 vessel PCI of  the LAD and OM of the circumflex. RHC at same time showed advanced HF with low cardiac index and severely elevated pressures. Diuresed with IV lasix, metolazone, and milrinone, but refused PICC line to follow coox/CVP. Underwent thoracentesis 02/01/16 with 1.8 L out. Overall diuresed 16 lbs. Discharge weight 214 lbs.  He was admitted in 4/18 with low output HF and severe NYHA class IV symptoms, started on milrinone.  Unable to titrate off milrinone and is now on at home.  He  had a right chronic transudative effusion, PleurX catheter was placed.    He had HVAD placed 05/30/16.  He was diuresed post-op and speed was increased to 3000.  It was later dropped to 2940.  Pleurx catheter was removed.    He was admitted in 7/18 with upper GI bleed.  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  He had 3 units PRBCs.  ASA was decreased from 325 to 81 daily and warfarin goal was decreased to 2-2.5.  Octreotide was started but he developed profuse diarrhea that seems to have been related to octreotide.  This was stopped and he was started on danazol.    He has calf claudication on left after walking a moderate distance.  This is chronic and followed by [facility]. His left fem-pop graft has occluded, plan for medical management as symptoms are mild.  No pedal ulcerations.    Mr Rea seems to be doing well today.  Weight is down 5 lbs.  MAP 80. No alarms from VAD.  He still smokes a cigarette occasionally.  He is not short of breath walking on flat ground.  Able to do chores around the house and in the yard.  More limited by claudication than anything else.  No chest pain.  No lightheadedness.  Occasional nosebleeds. No BRBPR/melena.    Labs (1/18): K 4.2, creatinine 0.9, hgb 12.6 Labs (2/18): K 4.2, creatinine 1.23, hgb 12.4, BNP 321 Labs (4/18): hgbA1c 8.9, TSH elevated but free T4 normal.  K 3.2, creatinine 0.8 Labs (5/18): digoxin 0.4, K 3.4, creatinine 0.9 Labs (6/18): creatinine 0.67 => 0.98, LDH 209, hgb 8.9 => 9 Labs (7/18): K 2.9, creatinine 1.02, LDL 189, hgb 9 Labs (8/18): hgb 10 => 10.2, LDH 204, K 3.2, creatinine 0.9 => 1.04, INR 2.45, LDH 204 Labs (9/18): LDH 206, INR 3.26, K 3.5, creatinine 0.91, hgb 11.3 Labs (10/18): LDH 197, hgb 11.4, K 3.1, creatinine 1.12 Labs (11/18): Hgb 10.3, K 4, creatinine 1.0, LDH 196 Labs (12/18): hgb 11, INR 2.05  HVAD interrogation: See LVAD nurse's note above  PMH: 1. PAD: Bilateral fem-pop bypasses - Arterial dopplers  (9/18) with occluded left fem-pop, right fem-pop patent.  2. CAD: Anterior MI 2005 => PCI to LAD followed by staged PCI to LCx and RCA.   - LHC (1/18): Diffuse up to 80% calcified stenosis throughout the proximal LAD.  Diffuse disease in distal LAD up to 50%.  Large OM1 totally occluded at the ostium but reconstituted via collaterals.  80% distal RCA stenosis involving the ostia of the PLV and PDA with about 70% stenosis. Patient had PCI with DES to proximal LAD and PTCA OM1 (unable to place stent).  3. Chronic systolic CHF: Ischemic cardiomyopathy.  St Jude ICD.  - Echo (1/18) with EF 20%, mildly decreased RV systolic function.  - RHC (1/18) with mean RA 21, PA 69/31 mean 45, mean PCWP 27, CI 2.37, PVR 3.3 WU.  - Repeat RHC (1/18) with mean RA 16, mean PCWP  21, CI 1.92 - Echo (3/18) with EF 20-25%, mild MR, mild RV dilation with normal systolic function, PASP 47 mmHg.  - 4/18 admission with low output HF and milrinone gtt started (co-ox as low as 42%).  - RHC on milrinone (4/18): mean RA 5, PA 61/24 mean 40, mean PCWP 17, CI 2.1 Fick with PVR 5.1 WU, CI 1.84 thermo with PVR 5.9 WU.  - HVAD placement 05/30/16.  4. Carotid stenosis: Carotid dopplers (8/16) with 00-37% RICA, 04-88% LICA.   - Carotid dopplers (2/18) with 40-59% RICA stenosis.  5. Type II diabetes. 6. Hyperlipidemia. 7. HTN 8. COPD:  PFTs (5/18) with moderate to severe obstructive disease.  Active smoker. 9. Chronic right pleural effusion (transudative) with PleurX catheter.  10. Upper GI bleeding: 7/18, enteroscopy with 2 small bowel AVMs (treated).   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  Smokes < 5 cigs/day.  Occasional ETOH.   Family History  Problem Relation Age of Onset  . Diabetes Mother   . Coronary artery disease Father   . Heart disease Father        before age 7   ROS: All systems reviewed and negative except as per HPI.   Current Outpatient Medications  Medication Sig Dispense Refill  .  aspirin EC 81 MG EC tablet Take 1 tablet (81 mg total) by mouth daily. 30 tablet 6  . atorvastatin (LIPITOR) 40 MG tablet Take 1 tablet (40 mg total) by mouth daily. 90 tablet 3  . blood glucose meter kit and supplies Ultra Blue Kit Dispense based on patient and insurance preference. Use up to four times daily as directed. (FOR ICD-9 250.00, 250.01). 1 each 0  . danazol (DANOCRINE) 100 MG capsule Take 1 capsule (100 mg total) 2 (two) times daily by mouth. 60 capsule 6  . ezetimibe (ZETIA) 10 MG tablet Take 1 tablet (10 mg total) by mouth daily. 90 tablet 3  . Ferrous Sulfate (IRON) 325 (65 Fe) MG TABS Take 1 tablet (325 mg total) 2 (two) times daily by mouth. 60 each 11  . insulin NPH-regular Human (NOVOLIN 70/30) (70-30) 100 UNIT/ML injection Inject 15 Units into the skin 3 (three) times daily.     Marland Kitchen losartan (COZAAR) 25 MG tablet Take 1 tablet (25 mg total) 2 (two) times daily by mouth. 60 tablet 6  . pantoprazole (PROTONIX) 40 MG tablet Take 1 tablet (40 mg total) by mouth 2 (two) times daily. 60 tablet 6  . potassium chloride SA (K-DUR,KLOR-CON) 20 MEQ tablet Take 2 tablets (40 mEq total) by mouth 2 (two) times daily. (Patient taking differently: Take 40 mEq by mouth 2 (two) times daily. Patient taking one pill twice daily; "difficult to swallow") 90 tablet 3  . sildenafil (REVATIO) 20 MG tablet Take 1 tablet (20 mg total) by mouth 3 (three) times daily. 90 tablet 11  . warfarin (COUMADIN) 5 MG tablet Take  5 mg (1 tablet) daily except 7.5 mg (1 and 1/2 tablets) on Mon/Wed/Fri    . gabapentin (NEURONTIN) 100 MG capsule Take 100 mg by mouth daily.    Marland Kitchen torsemide (DEMADEX) 20 MG tablet Take 3 tablets (60 mg total) 2 (two) times daily by mouth. (Patient taking differently: Take 60 mg by mouth 2 (two) times daily. Patient taking) 180 tablet 3   No current facility-administered medications for this encounter.    BP (!) 96/0 Comment: Doppler modified systolic - pt has LVAD  Pulse (!) 101   Ht  6'  (1.829 m)   Wt 240 lb (108.9 kg)   SpO2 99%   BMI 32.55 kg/m   MAP 80 GENERAL: Well appearing this am. NAD.  HEENT: Normal. NECK: Supple, JVP not elevated. Carotids OK.  CARDIAC:  Mechanical heart sounds with LVAD hum present.  LUNGS:  CTAB, normal effort.  ABDOMEN:  NT, ND, no HSM. No bruits or masses. +BS  LVAD exit site: Well-healed and incorporated. Dressing dry and intact. No erythema or drainage. Stabilization device present and accurately applied. Driveline dressing changed daily per sterile technique. EXTREMITIES:  Warm and dry. No cyanosis, clubbing, rash. Chronic lower extremity edema, improved (trace-1+).  NEUROLOGIC:  Alert & oriented x 3. Cranial nerves grossly intact. Moves all 4 extremities w/o difficulty. Affect pleasant     Wt Readings from Last 3 Encounters:  12/13/16 240 lb (108.9 kg)  11/13/16 245 lb 12.8 oz (111.5 kg)  10/12/16 231 lb 9.6 oz (105.1 kg)   Assessment/Plan: 1. Chronic systolic CHF: Ischemic cardiomyopathy.  Echo in 3/18 with EF 20-25%.  He did not have much effect from 12/17 PCI in terms of symptoms or EF.  St Jude ICD.  He is now s/p HVAD placement.  Lavare cycle is on, parameters stable on device with no alarms.  Suspect he has a degree of RV failure and will not tolerate CVP down to normal range.  Volume seems pretty good today by exam and weight is down 5 lbs.   - Continue torsemide 60 mg bid.  - MAP controlled, continue current losartan.  - He is going to think about whether he would eventually want to have transplant evaluation. Will have to stop smoking.   2. CAD: 3 vessel CAD, s/p stent to proximal LAD and PTCA to subtotally occluded large OM1 in 1/18. No change in symptoms or EF with intervention.  - He was on Plavix for > 3 months post-intervention, now on warfarin + ASA.  - Continue atorvastatin 80 mg daily.   3. PAD: Stable claudication, notes with moderate walking on left.  Left fem-pop bypass occluded.  No pedal ulcerations.  Medical  management per Dr. Scot Dock.  - I encouraged him to try walking through the pain for at least a short distance.  - I encouraged him to quit smoking.   4. Carotid stenosis:  Followed at [facility].  5. R Pleural effusion: PleurX catheter in past.  6. Pulmonary arterial HTN: Continue Revatio 20 mg tid.  7. Smoking: I again encouraged him to quit.   8. Upper GI bleeding: 7/18, small bowel AVMs.  ASA was decreased to 81 daily.  Warfarin INR goal is now 2-2.5.  He started monthly octreotide injections but had profuse diarrhea with octreotide after 1st injection and after 2nd injection with decreased dose.  He is tolerating danazol without problems. Hemoglobin higher, no BRBPR/melena.  9. Epistaxis: Recommended use of regular saline nasal spray.  Loralie Champagne 12/13/2016

## 2016-12-15 ENCOUNTER — Other Ambulatory Visit (HOSPITAL_COMMUNITY): Payer: Self-pay | Admitting: Unknown Physician Specialty

## 2016-12-15 DIAGNOSIS — Z7901 Long term (current) use of anticoagulants: Secondary | ICD-10-CM

## 2016-12-15 DIAGNOSIS — Z95811 Presence of heart assist device: Secondary | ICD-10-CM

## 2016-12-20 ENCOUNTER — Encounter (HOSPITAL_COMMUNITY): Payer: Self-pay

## 2016-12-20 ENCOUNTER — Telehealth (HOSPITAL_COMMUNITY): Payer: Self-pay | Admitting: *Deleted

## 2016-12-20 ENCOUNTER — Ambulatory Visit (HOSPITAL_COMMUNITY)
Admission: RE | Admit: 2016-12-20 | Discharge: 2016-12-20 | Disposition: A | Discharge status: Home / Self Care | Payer: BLUE CROSS/BLUE SHIELD | Source: Ambulatory Visit | Attending: Cardiology | Admitting: Cardiology

## 2016-12-20 ENCOUNTER — Ambulatory Visit (HOSPITAL_COMMUNITY): Payer: Self-pay | Admitting: Pharmacist

## 2016-12-20 VITALS — BP 90/60 | HR 110 | Resp 16 | Wt 240.0 lb

## 2016-12-20 DIAGNOSIS — I509 Heart failure, unspecified: Secondary | ICD-10-CM

## 2016-12-20 DIAGNOSIS — D649 Anemia, unspecified: Secondary | ICD-10-CM | POA: Insufficient documentation

## 2016-12-20 DIAGNOSIS — Z95811 Presence of heart assist device: Secondary | ICD-10-CM | POA: Insufficient documentation

## 2016-12-20 DIAGNOSIS — K31811 Angiodysplasia of stomach and duodenum with bleeding: Secondary | ICD-10-CM

## 2016-12-20 DIAGNOSIS — Z7901 Long term (current) use of anticoagulants: Secondary | ICD-10-CM | POA: Diagnosis present

## 2016-12-20 DIAGNOSIS — I5023 Acute on chronic systolic (congestive) heart failure: Secondary | ICD-10-CM

## 2016-12-20 LAB — CBC
HCT: 25 % — ABNORMAL LOW (ref 39.0–52.0)
HEMOGLOBIN: 7.9 g/dL — AB (ref 13.0–17.0)
MCH: 28.2 pg (ref 26.0–34.0)
MCHC: 31.6 g/dL (ref 30.0–36.0)
MCV: 89.3 fL (ref 78.0–100.0)
Platelets: 190 10*3/uL (ref 150–400)
RBC: 2.8 MIL/uL — ABNORMAL LOW (ref 4.22–5.81)
RDW: 17.3 % — ABNORMAL HIGH (ref 11.5–15.5)
WBC: 7.9 10*3/uL (ref 4.0–10.5)

## 2016-12-20 LAB — PREPARE RBC (CROSSMATCH)

## 2016-12-20 LAB — PROTIME-INR
INR: 5.05
Prothrombin Time: 46 seconds — ABNORMAL HIGH (ref 11.4–15.2)

## 2016-12-20 LAB — ABO/RH: ABO/RH(D): A POS

## 2016-12-20 MED ORDER — SODIUM CHLORIDE 0.9 % IV SOLN: 10.0000 mL/h | Freq: Once | INTRAVENOUS | Status: DC

## 2016-12-20 MED ORDER — LOSARTAN POTASSIUM 25 MG PO TABS: 25.0000 mg | ORAL_TABLET | Freq: Every day | ORAL | 6 refills | Status: DC

## 2016-12-20 NOTE — Progress Notes (Signed)
Patient presents for sick visit  in [facility] Clinic today. States he feels like his blood pressure is low. His home machine reports 80's/60's. Blood sugars have been running in the 100's.   Vital Signs:  Doppler Pressure 90   Automatc BP: 90/60 (72) HR:110   SPO2:96  %  Home weights: 240 lbs   VAD interrogation & Equipment Management: Speed:2840 Flow: 4.7 Power:5.0 w     Alarms: none  Plan: 1. CBC check today. Will call with results. No report of black stool. 2. Decrease Losartan to 25 mg daily per Dr Shirlee LatchMcLean 3. RTC in january as scheduled.  Marcellus ScottLesley Tatisha Cerino RN VAD Coordinator   Office: [phone] 24/7 Emergency VAD Pager: [phone]

## 2016-12-20 NOTE — Discharge Instructions (Signed)

## 2016-12-20 NOTE — Progress Notes (Signed)
Diagnosis: Anemia  Physician: Dr. Shirlee LatchMcLean  Procedure: [facility] PRBC.  Patient tolerated well. Vitals are stable. Patient is alert, oriented, verbal and ambulatory.  Discharge instructions given and verbalized understanding.

## 2016-12-20 NOTE — Addendum Note (Signed)
Encounter addended by: Thayer HeadingsWilson, Lesley K, RN on: 12/20/2016 11:26 AM  Actions taken: Sign clinical note, Visit diagnoses modified, Order list changed, Diagnosis association updated

## 2016-12-20 NOTE — Addendum Note (Signed)
Encounter addended by: Thayer HeadingsWilson, Lesley K, RN on: 12/20/2016 10:28 AM  Actions taken: Home Medications modified, Medication taking status modified, Visit diagnoses modified, Order Reconciliation Section accessed, Medication List reviewed, Vitals modified, Allergies reviewed, Sign clinical note, Pharmacy for encounter modified, Order list changed, Diagnosis association updated

## 2016-12-20 NOTE — Telephone Encounter (Signed)
Late entry- Spoke with patient regarding lab results. Instructed patient to stop taking aspirin. [facility] PRBC's. He is to arrive at Dr Randa EvensEdwards office [facility] for GI evaluation. And he is to arrive at [facility] clinic Friday at 0930 for follow-up with Dr Shirlee LatchMcLean. Patient reports adherence to all medications. Verbalizes understanding.  Marcellus ScottLesley Andalyn Heckstall RN, VAD Coordinator 24/7 pager [phone]

## 2016-12-20 NOTE — Patient Instructions (Signed)
Decrease losartan to 25 mg once a day  Return to clinic as scheduled

## 2016-12-21 LAB — BPAM RBC
BLOOD PRODUCT EXPIRATION DATE: 201901022359
ISSUE DATE / TIME: 201812191413
Unit Type and Rh: 6200

## 2016-12-21 LAB — TYPE AND SCREEN
ABO/RH(D): A POS
Antibody Screen: NEGATIVE
UNIT DIVISION: 0

## 2016-12-22 ENCOUNTER — Ambulatory Visit (HOSPITAL_COMMUNITY)
Admission: RE | Admit: 2016-12-22 | Discharge: 2016-12-22 | Disposition: A | Discharge status: Home / Self Care | Payer: BLUE CROSS/BLUE SHIELD | Source: Ambulatory Visit | Attending: Internal Medicine | Admitting: Internal Medicine

## 2016-12-22 ENCOUNTER — Encounter (HOSPITAL_COMMUNITY): Payer: Self-pay

## 2016-12-22 ENCOUNTER — Ambulatory Visit (HOSPITAL_COMMUNITY): Payer: Self-pay | Admitting: Pharmacist

## 2016-12-22 ENCOUNTER — Other Ambulatory Visit (HOSPITAL_COMMUNITY): Payer: Self-pay | Admitting: *Deleted

## 2016-12-22 VITALS — BP 121/73 | Ht 72.0 in | Wt 243.6 lb

## 2016-12-22 DIAGNOSIS — I5023 Acute on chronic systolic (congestive) heart failure: Secondary | ICD-10-CM | POA: Diagnosis not present

## 2016-12-22 DIAGNOSIS — K922 Gastrointestinal hemorrhage, unspecified: Secondary | ICD-10-CM | POA: Diagnosis not present

## 2016-12-22 DIAGNOSIS — I5043 Acute on chronic combined systolic (congestive) and diastolic (congestive) heart failure: Secondary | ICD-10-CM

## 2016-12-22 DIAGNOSIS — Z95828 Presence of other vascular implants and grafts: Secondary | ICD-10-CM | POA: Insufficient documentation

## 2016-12-22 DIAGNOSIS — E785 Hyperlipidemia, unspecified: Secondary | ICD-10-CM | POA: Insufficient documentation

## 2016-12-22 DIAGNOSIS — I509 Heart failure, unspecified: Secondary | ICD-10-CM

## 2016-12-22 DIAGNOSIS — I252 Old myocardial infarction: Secondary | ICD-10-CM | POA: Diagnosis not present

## 2016-12-22 DIAGNOSIS — I6523 Occlusion and stenosis of bilateral carotid arteries: Secondary | ICD-10-CM | POA: Diagnosis not present

## 2016-12-22 DIAGNOSIS — I5022 Chronic systolic (congestive) heart failure: Secondary | ICD-10-CM | POA: Diagnosis not present

## 2016-12-22 DIAGNOSIS — Z794 Long term (current) use of insulin: Secondary | ICD-10-CM | POA: Diagnosis not present

## 2016-12-22 DIAGNOSIS — Z833 Family history of diabetes mellitus: Secondary | ICD-10-CM | POA: Insufficient documentation

## 2016-12-22 DIAGNOSIS — Z7901 Long term (current) use of anticoagulants: Secondary | ICD-10-CM | POA: Diagnosis not present

## 2016-12-22 DIAGNOSIS — I2510 Atherosclerotic heart disease of native coronary artery without angina pectoris: Secondary | ICD-10-CM | POA: Insufficient documentation

## 2016-12-22 DIAGNOSIS — I2721 Secondary pulmonary arterial hypertension: Secondary | ICD-10-CM | POA: Diagnosis not present

## 2016-12-22 DIAGNOSIS — Z8249 Family history of ischemic heart disease and other diseases of the circulatory system: Secondary | ICD-10-CM | POA: Diagnosis not present

## 2016-12-22 DIAGNOSIS — Z95811 Presence of heart assist device: Secondary | ICD-10-CM | POA: Insufficient documentation

## 2016-12-22 DIAGNOSIS — Z79899 Other long term (current) drug therapy: Secondary | ICD-10-CM | POA: Insufficient documentation

## 2016-12-22 DIAGNOSIS — D649 Anemia, unspecified: Secondary | ICD-10-CM

## 2016-12-22 DIAGNOSIS — I255 Ischemic cardiomyopathy: Secondary | ICD-10-CM | POA: Diagnosis not present

## 2016-12-22 DIAGNOSIS — J449 Chronic obstructive pulmonary disease, unspecified: Secondary | ICD-10-CM | POA: Insufficient documentation

## 2016-12-22 DIAGNOSIS — K31811 Angiodysplasia of stomach and duodenum with bleeding: Secondary | ICD-10-CM | POA: Diagnosis not present

## 2016-12-22 DIAGNOSIS — E119 Type 2 diabetes mellitus without complications: Secondary | ICD-10-CM | POA: Diagnosis not present

## 2016-12-22 DIAGNOSIS — F17210 Nicotine dependence, cigarettes, uncomplicated: Secondary | ICD-10-CM | POA: Diagnosis not present

## 2016-12-22 DIAGNOSIS — Z955 Presence of coronary angioplasty implant and graft: Secondary | ICD-10-CM | POA: Diagnosis not present

## 2016-12-22 DIAGNOSIS — I110 Hypertensive heart disease with heart failure: Secondary | ICD-10-CM | POA: Diagnosis not present

## 2016-12-22 LAB — BASIC METABOLIC PANEL
Anion gap: 10 (ref 5–15)
BUN: 11 mg/dL (ref 6–20)
CALCIUM: 8.6 mg/dL — AB (ref 8.9–10.3)
CHLORIDE: 101 mmol/L (ref 101–111)
CO2: 25 mmol/L (ref 22–32)
CREATININE: 1.18 mg/dL (ref 0.61–1.24)
Glucose, Bld: 140 mg/dL — ABNORMAL HIGH (ref 65–99)
Potassium: 3.5 mmol/L (ref 3.5–5.1)
SODIUM: 136 mmol/L (ref 135–145)

## 2016-12-22 LAB — CBC
HCT: 27.6 % — ABNORMAL LOW (ref 39.0–52.0)
Hemoglobin: 8.6 g/dL — ABNORMAL LOW (ref 13.0–17.0)
MCH: 28.2 pg (ref 26.0–34.0)
MCHC: 31.2 g/dL (ref 30.0–36.0)
MCV: 90.5 fL (ref 78.0–100.0)
PLATELETS: 197 10*3/uL (ref 150–400)
RBC: 3.05 MIL/uL — AB (ref 4.22–5.81)
RDW: 17.5 % — AB (ref 11.5–15.5)
WBC: 6.9 10*3/uL (ref 4.0–10.5)

## 2016-12-22 LAB — LACTATE DEHYDROGENASE: LDH: 186 U/L (ref 98–192)

## 2016-12-22 LAB — PROTIME-INR
INR: 2.66
PROTHROMBIN TIME: 28.1 s — AB (ref 11.4–15.2)

## 2016-12-22 MED ORDER — IRON 325 (65 FE) MG PO TABS: 1.0000 | ORAL_TABLET | Freq: Three times a day (TID) | ORAL | 11 refills | Status: DC

## 2016-12-22 MED ORDER — LOSARTAN POTASSIUM 25 MG PO TABS: 25.0000 mg | ORAL_TABLET | Freq: Two times a day (BID) | ORAL | 6 refills | Status: DC

## 2016-12-22 NOTE — Progress Notes (Signed)
Medical Record request received by [employer] for all record 05/02/2016--present.  All available records from our office/providers sent to office via mail (>300 pages to provided address  [facility] [employer] [address] [place], [place] [id number] Physician form also mailed but faxed to provided # [phone] completed to keep patient out of work through 05/02/2017 per Dr. Shirlee LatchMcLean.  [name], Yanelie Abraha [name], RN

## 2016-12-22 NOTE — Progress Notes (Addendum)
Patient presents for f/u from sick visit earlier this week. Reports no problems with VAD equipment or concerns with drive line.   Pt seen in clinic 12/20/16 with drop in Hgb from 11.0 - 7.9. Pt received one unit of blood at [facility]. He also saw Dr. Oletta Lamas that day for GI f/u. Pt says doctor performed rectal exam that was positive for minimal blood. He also says doctor told him he would not consider colonoscopy unless he was actively bleeding.   States stools have been dark green with no sxs of bleeding since Saturday (12/16/16). States he still has some episodes of orthostatic dizziness and trouble focusing with his eyes. Denies any syncope or falls.   Pt says he takes 60 mg twice daily, but adds an extra 20 mg twice weekly. He does this when his wt is up 1 - 2 lbs.   Vital Signs:  Doppler Pressure  120 Automatc BP:  121/73 (91) HR:  ---   SPO2:  ---  Weight:  243.6  lb w/o eqt Last weight: 240  lb Home weights:  238 - 242 lbs  Home blood sugars have been running 82 - 141.  VAD Indication: Destination Therapy - current smoker  VAD interrogation & Equipment Management (reviewed with Dr. Aundra Dubin): Speed: 2840 Flow: 4.6 Power: 5.0 w    Peak/trough:  5.8/3.0 HCT: 33 Alarm settings:3/7 Suction: On Lavare: On    Exit Site Care: Drive line is being maintained weekly  by [employer]. Drive line dressing changed using sterile technique. Drive line exit site well healed and incorporated. The velour is fully implanted at exit site. Dressing dry and intact. No erythema or drainage. Stabilization device present and accurately applied. Pt denies fever or chills. Pt says he has adequate dressing supplies at home.    Significant Events on VAD Support:  05/31/16 - Controller power-up associated with pump start event. Controller change out performed.  06/01/16 - Controller pins lubricated per St Jude reps  06/17/16 - Controller power-up associated with pump start event while changing power  source 06/22/16 - Controller pins re-lubricated per [facility] reps   Device:St Jude single lead- followed at device clinic Therapies: on at 200 bpm Last check: 05/17/16   BP & Labs:  Doppler BP 120  - Doppler is reflecting modified systolic   Hgb - 8.6; no S/S of bleeding. Specifically denies melena/BRBPR or nosebleeds.  LDH pending; established baseline of 190- 250. Denies tea-colored urine. No power elevations noted on interrogation.    Patient Instructions:  1. Do not re-start ASA. 2. INR goal 2.0 - 2.5 3. Increase Losartan to 25 mg twice daily. 4. Increase Iron tablets to 3 x daily. 5. Return in one week for labs - CBC, INR, and iron studies   Zada Girt RN, BSN VAD Coordinator   Office: [phone] 24/7 Emergency VAD Pager: [phone]  PCP: Dr. Kathyrn Lass Cardiology: Dr. Stanford Breed HF Cardiology: Dr. Aundra Dubin  61 y.o. smoker with history of PAD, CAD, and ischemic cardiomyopathy now s/p HVAD placement presents for followup of CHF and LVAD.  Patient was admitted in 1/18 with PNA and acute on chronic systolic CHF.  He was markedly volume overloaded and also had low cardiac output requiring dobutamine use.  He had a right pleural effusion and underwent thoracentesis.  Fluid studies suggested a parapneumonic effusion.  He had cardiac cath, showing 3 vessel disease and markedly elevated filling pressures, right and left.  He was evaluated for CABG but turned down given comorbidities  and lack of venous conduits.  We were able to wean him off dobutamine and discharged him with plan for outpatient PCI once he had recovered from the current hospitalization.   Admitted 01/28/16 - 02/03/16 after presenting for elective 2 vessel PCI of the LAD and OM of the circumflex. RHC at same time showed advanced HF with low cardiac index and severely elevated pressures. Diuresed with IV lasix, metolazone, and milrinone, but refused PICC line to follow coox/CVP. Underwent thoracentesis 02/01/16 with 1.8 L  out. Overall diuresed 16 lbs. Discharge weight 214 lbs.  He was admitted in 4/18 with low output HF and severe NYHA class IV symptoms, started on milrinone.  Unable to titrate off milrinone and is now on at home.  He had a right chronic transudative effusion, PleurX catheter was placed.    He had HVAD placed 05/30/16.  He was diuresed post-op and speed was increased to 3000.  It was later dropped to 2940.  Pleurx catheter was removed.    He was admitted in 7/18 with upper GI bleed.  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  He had 3 units PRBCs.  ASA was decreased from 325 to 81 daily and warfarin goal was decreased to 2-2.5.  Octreotide was started but he developed profuse diarrhea that seems to have been related to octreotide.  This was stopped and he was started on danazol.    He has calf claudication on left after walking a moderate distance.  This is chronic and followed by [facility]. His left fem-pop graft has occluded, plan for medical management as symptoms are mild.  No pedal ulcerations.    Patient returns for followup of LVAD.  His still has been darker recently and he started to feel more fatigued.  CBC earlier this week showed hgb down to 7.9.  He got 1 unit PRBCs, hgb up to 8.6 today.  He saw GI.  INR was 5 at this time, stool was "weakly positive" for blood. Also, he has been having large nosebleeds in the setting of supratherapeutic INR.  Weight has been stable at home.  Occasionally he takes an extra torsemide.  No significant dyspnea with his ADLs.    Labs (1/18): K 4.2, creatinine 0.9, hgb 12.6 Labs (2/18): K 4.2, creatinine 1.23, hgb 12.4, BNP 321 Labs (4/18): hgbA1c 8.9, TSH elevated but free T4 normal.  K 3.2, creatinine 0.8 Labs (5/18): digoxin 0.4, K 3.4, creatinine 0.9 Labs (6/18): creatinine 0.67 => 0.98, LDH 209, hgb 8.9 => 9 Labs (7/18): K 2.9, creatinine 1.02, LDL 189, hgb 9 Labs (8/18): hgb 10 => 10.2, LDH 204, K 3.2, creatinine 0.9 => 1.04, INR 2.45, LDH  204 Labs (9/18): LDH 206, INR 3.26, K 3.5, creatinine 0.91, hgb 11.3 Labs (10/18): LDH 197, hgb 11.4, K 3.1, creatinine 1.12 Labs (11/18): Hgb 10.3, K 4, creatinine 1.0, LDH 196 Labs (12/18): hgb 11 => 7.9 => 8.6, INR 5  HVAD interrogation: See LVAD nurse's note above  PMH: 1. PAD: Bilateral fem-pop bypasses - Arterial dopplers (9/18) with occluded left fem-pop, right fem-pop patent.  2. CAD: Anterior MI 2005 => PCI to LAD followed by staged PCI to LCx and RCA.   - LHC (1/18): Diffuse up to 80% calcified stenosis throughout the proximal LAD.  Diffuse disease in distal LAD up to 50%.  Large OM1 totally occluded at the ostium but reconstituted via collaterals.  80% distal RCA stenosis involving the ostia of the PLV and PDA with about 70% stenosis.   Patient had PCI with DES to proximal LAD and PTCA OM1 (unable to place stent).  3. Chronic systolic CHF: Ischemic cardiomyopathy.  St Jude ICD.  - Echo (1/18) with EF 20%, mildly decreased RV systolic function.  - RHC (1/18) with mean RA 21, PA 69/31 mean 45, mean PCWP 27, CI 2.37, PVR 3.3 WU.  - Repeat RHC (1/18) with mean RA 16, mean PCWP 21, CI 1.92 - Echo (3/18) with EF 20-25%, mild MR, mild RV dilation with normal systolic function, PASP 47 mmHg.  - 4/18 admission with low output HF and milrinone gtt started (co-ox as low as 42%).  - RHC on milrinone (4/18): mean RA 5, PA 61/24 mean 40, mean PCWP 17, CI 2.1 Fick with PVR 5.1 WU, CI 1.84 thermo with PVR 5.9 WU.  - HVAD placement 05/30/16.  4. Carotid stenosis: Carotid dopplers (8/16) with 25-05% RICA, 39-76% LICA.   - Carotid dopplers (2/18) with 40-59% RICA stenosis.  5. Type II diabetes. 6. Hyperlipidemia. 7. HTN 8. COPD:  PFTs (5/18) with moderate to severe obstructive disease.  Active smoker. 9. Chronic right pleural effusion (transudative) with PleurX catheter.  10. Upper GI bleeding: 7/18, enteroscopy with 2 small bowel AVMs (treated).   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  Smokes < 5 cigs/day.  Occasional ETOH.   Family History  Problem Relation Age of Onset  . Diabetes Mother   . Coronary artery disease Father   . Heart disease Father        before age 32   ROS: All systems reviewed and negative except as per HPI.   Current Outpatient Medications  Medication Sig Dispense Refill  . atorvastatin (LIPITOR) 40 MG tablet Take 1 tablet (40 mg total) by mouth daily. 90 tablet 3  . blood glucose meter kit and supplies Ultra Blue Kit Dispense based on patient and insurance preference. Use up to four times daily as directed. (FOR ICD-9 250.00, 250.01). 1 each 0  . danazol (DANOCRINE) 100 MG capsule Take 1 capsule (100 mg total) 2 (two) times daily by mouth. 60 capsule 6  . ezetimibe (ZETIA) 10 MG tablet Take 1 tablet (10 mg total) by mouth daily. 90 tablet 3  . Ferrous Sulfate (IRON) 325 (65 Fe) MG TABS Take 1 tablet (325 mg total) by mouth 3 (three) times daily. 90 each 11  . insulin NPH-regular Human (NOVOLIN 70/30) (70-30) 100 UNIT/ML injection Inject 15 Units into the skin 3 (three) times daily.     Marland Kitchen losartan (COZAAR) 25 MG tablet Take 1 tablet (25 mg total) by mouth 2 (two) times daily. 60 tablet 6  . pantoprazole (PROTONIX) 40 MG tablet Take 1 tablet (40 mg total) by mouth 2 (two) times daily. 60 tablet 6  . potassium chloride SA (K-DUR,KLOR-CON) 20 MEQ tablet Take 2 tablets (40 mEq total) by mouth 2 (two) times daily. (Patient taking differently: Take 40 mEq by mouth 2 (two) times daily. Patient taking one pill twice daily; "difficult to swallow") 90 tablet 3  . sildenafil (REVATIO) 20 MG tablet Take 1 tablet (20 mg total) by mouth 3 (three) times daily. 90 tablet 11  . torsemide (DEMADEX) 20 MG tablet Take 3 tablets (60 mg total) 2 (two) times daily by mouth. 180 tablet 3  . aspirin EC 81 MG EC tablet Take 1 tablet (81 mg total) by mouth daily. (Patient not taking: Reported on 12/22/2016) 30 tablet 6  . warfarin (COUMADIN) 5 MG tablet Take 5  mg by  mouth daily.      No current facility-administered medications for this encounter.    BP 121/73 Comment: Auto cuff MAP 91  Ht 6' (1.829 m)   Wt 243 lb 9.6 oz (110.5 kg)   BMI 33.04 kg/m   MAP 91 GENERAL: Well appearing this am. NAD.  HEENT: Normal. NECK: Supple, JVP 8 cm. Carotids OK.  CARDIAC:  Mechanical heart sounds with LVAD hum present.  LUNGS:  CTAB, normal effort.  ABDOMEN:  NT, ND, no HSM. No bruits or masses. +BS  LVAD exit site: Well-healed and incorporated. Dressing dry and intact. No erythema or drainage. Stabilization device present and accurately applied. Driveline dressing changed daily per sterile technique. EXTREMITIES:  Warm and dry. No cyanosis, clubbing, rash.  1+ ankle edema.  NEUROLOGIC:  Alert & oriented x 3. Cranial nerves grossly intact. Moves all 4 extremities w/o difficulty. Affect pleasant     Wt Readings from Last 3 Encounters:  12/22/16 243 lb 9.6 oz (110.5 kg)  12/20/16 240 lb (108.9 kg)  12/13/16 240 lb (108.9 kg)   Assessment/Plan: 1. Chronic systolic CHF: Ischemic cardiomyopathy.  Echo in 3/18 with EF 20-25%.  He did not have much effect from 12/17 PCI in terms of symptoms or EF.  St Jude ICD.  He is now s/p HVAD placement.  Lavare cycle is on, parameters stable on device with no alarms.  Suspect he has a degree of RV failure and will not tolerate CVP down to normal range.  Volume seems pretty good today by exam and weight is stable.   - Continue torsemide 60 mg bid.  - Losartan recently decreased to 25 mg daily but MAP high today in 90s.  Increase losartan back to 25 mg bid.   - He is going to think about whether he would eventually want to have transplant evaluation. Will have to stop smoking.   2. CAD: 3 vessel CAD, s/p stent to proximal LAD and PTCA to subtotally occluded large OM1 in 1/18. No change in symptoms or EF with intervention.  - He was on Plavix for > 3 months post-intervention, now on warfarin + ASA.  - Continue atorvastatin 80 mg  daily.   3. PAD: Stable claudication, notes with moderate walking on left.  Left fem-pop bypass occluded.  No pedal ulcerations.  Medical management per Dr. Scot Dock.  - I encouraged him to try walking through the pain for at least a short distance.  - I encouraged him to quit smoking.   4. Carotid stenosis:  Followed at [facility].  5. R Pleural effusion: PleurX catheter in past.  6. Pulmonary arterial HTN: Continue Revatio 20 mg tid.  7. Smoking: I again encouraged him to quit.   8. Upper GI bleeding: 7/18, small bowel AVMs.  ASA was decreased to 81 daily.  Warfarin INR goal is now 2-2.5.  He started monthly octreotide injections but had profuse diarrhea with octreotide after 1st injection and after 2nd injection with decreased dose.  He is tolerating danazol without problems. Recently has had lower hemoglobin with darker stool in setting of supratherapeutic INR.  Possible AVM bleeding (but could also be swallowed blood from large nosebleeds.   - He had 1 unit PRBCs yesterday with increase in hgb.  GI following.  - Repeat CBC next week and also send Fe studies.   - He will come to the ER if he starts to feel worse.  - He will stop ASA 81 and will continue INR goal 2-2.5  for now.   9. Epistaxis: Recommended use of regular saline nasal spray.  Dalton McLean 12/25/2016                                                                                                                      

## 2016-12-22 NOTE — Patient Instructions (Signed)
1. Do not re-start ASA. 2. INR goal 2.0 - 2.5 3. Increase Losartan to 25 mg twice daily. 4. Increase Iron tablets to 3 x daily. 5. Return in one week for labs - CBC, INR, and iron studies

## 2016-12-25 NOTE — Addendum Note (Signed)
Encounter addended by: Laurey MoraleMcLean, Dyanara Cozza S, MD on: 12/25/2016 12:09 AM  Actions taken: LOS modified

## 2016-12-27 ENCOUNTER — Other Ambulatory Visit (HOSPITAL_COMMUNITY): Payer: Self-pay | Admitting: *Deleted

## 2016-12-27 ENCOUNTER — Ambulatory Visit (HOSPITAL_COMMUNITY)
Admission: RE | Admit: 2016-12-27 | Discharge: 2016-12-27 | Disposition: A | Discharge status: Home / Self Care | Payer: BLUE CROSS/BLUE SHIELD | Source: Ambulatory Visit | Attending: Cardiology | Admitting: Cardiology

## 2016-12-27 ENCOUNTER — Ambulatory Visit (HOSPITAL_COMMUNITY): Payer: Self-pay | Admitting: Pharmacist

## 2016-12-27 DIAGNOSIS — I509 Heart failure, unspecified: Secondary | ICD-10-CM

## 2016-12-27 DIAGNOSIS — I5022 Chronic systolic (congestive) heart failure: Secondary | ICD-10-CM

## 2016-12-27 DIAGNOSIS — I5043 Acute on chronic combined systolic (congestive) and diastolic (congestive) heart failure: Secondary | ICD-10-CM | POA: Insufficient documentation

## 2016-12-27 DIAGNOSIS — D508 Other iron deficiency anemias: Secondary | ICD-10-CM | POA: Diagnosis present

## 2016-12-27 DIAGNOSIS — Z95810 Presence of automatic (implantable) cardiac defibrillator: Secondary | ICD-10-CM

## 2016-12-27 DIAGNOSIS — Z95811 Presence of heart assist device: Secondary | ICD-10-CM

## 2016-12-27 DIAGNOSIS — I5023 Acute on chronic systolic (congestive) heart failure: Secondary | ICD-10-CM

## 2016-12-27 DIAGNOSIS — K31811 Angiodysplasia of stomach and duodenum with bleeding: Secondary | ICD-10-CM

## 2016-12-27 LAB — CBC
HEMATOCRIT: 31.2 % — AB (ref 39.0–52.0)
Hemoglobin: 9.6 g/dL — ABNORMAL LOW (ref 13.0–17.0)
MCH: 28.7 pg (ref 26.0–34.0)
MCHC: 30.8 g/dL (ref 30.0–36.0)
MCV: 93.1 fL (ref 78.0–100.0)
Platelets: 192 10*3/uL (ref 150–400)
RBC: 3.35 MIL/uL — ABNORMAL LOW (ref 4.22–5.81)
RDW: 19.1 % — AB (ref 11.5–15.5)
WBC: 6.3 10*3/uL (ref 4.0–10.5)

## 2016-12-27 LAB — PROTIME-INR
INR: 2.14
PROTHROMBIN TIME: 23.8 s — AB (ref 11.4–15.2)

## 2016-12-27 MED ORDER — PANTOPRAZOLE SODIUM 40 MG PO TBEC: 40.0000 mg | DELAYED_RELEASE_TABLET | Freq: Two times a day (BID) | ORAL | 6 refills | Status: DC

## 2016-12-27 MED ORDER — TORSEMIDE 20 MG PO TABS: 60.0000 mg | ORAL_TABLET | Freq: Two times a day (BID) | ORAL | 3 refills | Status: DC

## 2016-12-29 ENCOUNTER — Other Ambulatory Visit (HOSPITAL_COMMUNITY): Payer: Self-pay | Admitting: Unknown Physician Specialty

## 2016-12-29 DIAGNOSIS — Z7901 Long term (current) use of anticoagulants: Secondary | ICD-10-CM

## 2016-12-29 DIAGNOSIS — Z95811 Presence of heart assist device: Secondary | ICD-10-CM

## 2017-01-03 ENCOUNTER — Ambulatory Visit (HOSPITAL_COMMUNITY)
Admission: RE | Admit: 2017-01-03 | Discharge: 2017-01-03 | Disposition: A | Discharge status: Home / Self Care | Payer: BLUE CROSS/BLUE SHIELD | Source: Ambulatory Visit | Attending: Cardiology | Admitting: Cardiology

## 2017-01-03 ENCOUNTER — Ambulatory Visit (HOSPITAL_COMMUNITY): Payer: Self-pay | Admitting: Pharmacist

## 2017-01-03 ENCOUNTER — Other Ambulatory Visit (HOSPITAL_COMMUNITY): Payer: Self-pay

## 2017-01-03 DIAGNOSIS — I5022 Chronic systolic (congestive) heart failure: Secondary | ICD-10-CM

## 2017-01-03 DIAGNOSIS — Z95811 Presence of heart assist device: Secondary | ICD-10-CM

## 2017-01-03 DIAGNOSIS — I5043 Acute on chronic combined systolic (congestive) and diastolic (congestive) heart failure: Secondary | ICD-10-CM

## 2017-01-03 DIAGNOSIS — Z7901 Long term (current) use of anticoagulants: Secondary | ICD-10-CM

## 2017-01-03 DIAGNOSIS — D508 Other iron deficiency anemias: Secondary | ICD-10-CM

## 2017-01-03 LAB — CBC
HCT: 34.9 % — ABNORMAL LOW (ref 39.0–52.0)
HEMATOCRIT: 34.9 % — AB (ref 39.0–52.0)
HEMOGLOBIN: 10.3 g/dL — AB (ref 13.0–17.0)
Hemoglobin: 10.6 g/dL — ABNORMAL LOW (ref 13.0–17.0)
MCH: 29.1 pg (ref 26.0–34.0)
MCH: 27.8 pg (ref 26.0–34.0)
MCHC: 30.4 g/dL (ref 30.0–36.0)
MCHC: 29.5 g/dL — ABNORMAL LOW (ref 30.0–36.0)
MCV: 95.9 fL (ref 78.0–100.0)
MCV: 94.3 fL (ref 78.0–100.0)
PLATELETS: 191 10*3/uL (ref 150–400)
Platelets: 190 10*3/uL (ref 150–400)
RBC: 3.64 MIL/uL — ABNORMAL LOW (ref 4.22–5.81)
RBC: 3.7 MIL/uL — AB (ref 4.22–5.81)
RDW: 18.6 % — AB (ref 11.5–15.5)
RDW: 18.3 % — ABNORMAL HIGH (ref 11.5–15.5)
WBC: 6.7 10*3/uL (ref 4.0–10.5)
WBC: 6.8 10*3/uL (ref 4.0–10.5)

## 2017-01-03 LAB — IRON AND TIBC
Iron: 81 ug/dL (ref 45–182)
Saturation Ratios: 18 % (ref 17.9–39.5)
TIBC: 454 ug/dL — ABNORMAL HIGH (ref 250–450)
UIBC: 373 ug/dL

## 2017-01-03 LAB — FOLATE: Folate: 19 ng/mL (ref >5.9)

## 2017-01-03 LAB — PROTIME-INR
INR: 1.98
Prothrombin Time: 22.4 seconds — ABNORMAL HIGH (ref 11.4–15.2)

## 2017-01-03 LAB — VITAMIN B12: Vitamin B-12: 829 pg/mL (ref 180–914)

## 2017-01-03 LAB — FERRITIN: FERRITIN: 62 ng/mL (ref 24–336)

## 2017-01-03 NOTE — Addendum Note (Signed)
Encounter addended by: Theresia BoughJeffries, Chantel M, CMA on: 01/03/2017 4:34 PM  Actions taken: Visit diagnoses modified, Order list changed, Diagnosis association updated

## 2017-01-04 NOTE — Addendum Note (Signed)
Encounter addended by: Thayer HeadingsWilson, Lesley K, RN on: 01/04/2017 11:20 AM  Actions taken: Order list changed

## 2017-01-08 ENCOUNTER — Other Ambulatory Visit (HOSPITAL_COMMUNITY): Payer: Self-pay | Admitting: Pharmacist

## 2017-01-08 DIAGNOSIS — I509 Heart failure, unspecified: Secondary | ICD-10-CM

## 2017-01-08 DIAGNOSIS — Z95811 Presence of heart assist device: Secondary | ICD-10-CM

## 2017-01-08 DIAGNOSIS — Z95810 Presence of automatic (implantable) cardiac defibrillator: Secondary | ICD-10-CM

## 2017-01-08 DIAGNOSIS — I5023 Acute on chronic systolic (congestive) heart failure: Secondary | ICD-10-CM

## 2017-01-08 MED ORDER — EZETIMIBE 10 MG PO TABS: 10.0000 mg | ORAL_TABLET | Freq: Every day | ORAL | 3 refills | Status: DC

## 2017-01-15 ENCOUNTER — Ambulatory Visit (HOSPITAL_COMMUNITY): Payer: Self-pay | Admitting: Pharmacist

## 2017-01-15 ENCOUNTER — Other Ambulatory Visit (HOSPITAL_COMMUNITY): Payer: Self-pay | Admitting: *Deleted

## 2017-01-15 ENCOUNTER — Ambulatory Visit (HOSPITAL_COMMUNITY)
Admission: RE | Admit: 2017-01-15 | Discharge: 2017-01-15 | Disposition: A | Discharge status: Home / Self Care | Payer: BLUE CROSS/BLUE SHIELD | Source: Ambulatory Visit | Attending: Cardiology | Admitting: Cardiology

## 2017-01-15 ENCOUNTER — Encounter (HOSPITAL_COMMUNITY): Payer: Self-pay

## 2017-01-15 DIAGNOSIS — Z955 Presence of coronary angioplasty implant and graft: Secondary | ICD-10-CM | POA: Diagnosis not present

## 2017-01-15 DIAGNOSIS — F17210 Nicotine dependence, cigarettes, uncomplicated: Secondary | ICD-10-CM | POA: Insufficient documentation

## 2017-01-15 DIAGNOSIS — Z95811 Presence of heart assist device: Secondary | ICD-10-CM

## 2017-01-15 DIAGNOSIS — Z4502 Encounter for adjustment and management of automatic implantable cardiac defibrillator: Secondary | ICD-10-CM | POA: Diagnosis not present

## 2017-01-15 DIAGNOSIS — Z794 Long term (current) use of insulin: Secondary | ICD-10-CM | POA: Insufficient documentation

## 2017-01-15 DIAGNOSIS — I5022 Chronic systolic (congestive) heart failure: Secondary | ICD-10-CM | POA: Diagnosis not present

## 2017-01-15 DIAGNOSIS — I2510 Atherosclerotic heart disease of native coronary artery without angina pectoris: Secondary | ICD-10-CM | POA: Insufficient documentation

## 2017-01-15 DIAGNOSIS — I110 Hypertensive heart disease with heart failure: Secondary | ICD-10-CM | POA: Insufficient documentation

## 2017-01-15 DIAGNOSIS — J90 Pleural effusion, not elsewhere classified: Secondary | ICD-10-CM | POA: Insufficient documentation

## 2017-01-15 DIAGNOSIS — E1151 Type 2 diabetes mellitus with diabetic peripheral angiopathy without gangrene: Secondary | ICD-10-CM | POA: Insufficient documentation

## 2017-01-15 DIAGNOSIS — E785 Hyperlipidemia, unspecified: Secondary | ICD-10-CM | POA: Diagnosis not present

## 2017-01-15 DIAGNOSIS — R040 Epistaxis: Secondary | ICD-10-CM | POA: Insufficient documentation

## 2017-01-15 DIAGNOSIS — I2721 Secondary pulmonary arterial hypertension: Secondary | ICD-10-CM | POA: Diagnosis not present

## 2017-01-15 DIAGNOSIS — I5043 Acute on chronic combined systolic (congestive) and diastolic (congestive) heart failure: Secondary | ICD-10-CM

## 2017-01-15 DIAGNOSIS — J449 Chronic obstructive pulmonary disease, unspecified: Secondary | ICD-10-CM | POA: Insufficient documentation

## 2017-01-15 DIAGNOSIS — Z7901 Long term (current) use of anticoagulants: Secondary | ICD-10-CM | POA: Insufficient documentation

## 2017-01-15 DIAGNOSIS — I255 Ischemic cardiomyopathy: Secondary | ICD-10-CM | POA: Diagnosis not present

## 2017-01-15 DIAGNOSIS — Z79899 Other long term (current) drug therapy: Secondary | ICD-10-CM | POA: Insufficient documentation

## 2017-01-15 LAB — BASIC METABOLIC PANEL
Anion gap: 15 (ref 5–15)
BUN: 12 mg/dL (ref 6–20)
CALCIUM: 9.3 mg/dL (ref 8.9–10.3)
CO2: 27 mmol/L (ref 22–32)
CREATININE: 1.29 mg/dL — AB (ref 0.61–1.24)
Chloride: 93 mmol/L — ABNORMAL LOW (ref 101–111)
GFR calc Af Amer: >60 mL/min (ref >60)
GFR, EST NON AFRICAN AMERICAN: 58 mL/min — AB (ref >60)
GLUCOSE: 193 mg/dL — AB (ref 65–99)
Potassium: 4.1 mmol/L (ref 3.5–5.1)
Sodium: 135 mmol/L (ref 135–145)

## 2017-01-15 LAB — CBC
HCT: 42.2 % (ref 39.0–52.0)
Hemoglobin: 13.3 g/dL (ref 13.0–17.0)
MCH: 29.2 pg (ref 26.0–34.0)
MCHC: 31.5 g/dL (ref 30.0–36.0)
MCV: 92.7 fL (ref 78.0–100.0)
PLATELETS: 196 10*3/uL (ref 150–400)
RBC: 4.55 MIL/uL (ref 4.22–5.81)
RDW: 16.4 % — AB (ref 11.5–15.5)
WBC: 8.2 10*3/uL (ref 4.0–10.5)

## 2017-01-15 LAB — PROTIME-INR
INR: 2.26
PROTHROMBIN TIME: 24.7 s — AB (ref 11.4–15.2)

## 2017-01-15 LAB — LACTATE DEHYDROGENASE: LDH: 205 U/L — AB (ref 98–192)

## 2017-01-15 NOTE — Addendum Note (Signed)
Encounter addended by: Laurey MoraleMcLean, Kailena Lubas S, MD on: 01/15/2017 10:14 PM  Actions taken: LOS modified

## 2017-01-15 NOTE — Patient Instructions (Signed)
1. No changes in medications 2. Return to VAD clinic in 2 months

## 2017-01-15 NOTE — Progress Notes (Addendum)
Patient presents for 1 mo f/u in VAD clinic. Reports no problems with VAD equipment or concerns with drive line.   States he felt "the best he has felt" for 4 days last week.   States stools are lighter green in color with no sxs of bleeding.  States he has had come bouts of "watery diarrhea", but is not daily.  Pt says his weight trended up over holidays and has taken Torsemide 80 mg twice daily (instead of 60 mg bid) for last 5 days. Pt only takes K-Dur 20 meq twice daily ("can't swallow these pills easily").  Pt saw Dr. Delrae Rend last [facility]) and says his Insulin was decreased from 15 units to 12 units three times daily for BS's in the 60's.   Home logbook reviewed logbook:  - home BSs  60 - 114  - home wts 241 - 546 lbs  - BP systolic 80 - 270; diastolic 70 - 80  Vital Signs:  Doppler Pressure  110 Automatc BP:  110/67 (89) HR: 97 SPO2: 99  Weight:  238.8  lb w/o eqt Last weight: 243.6  lb Home weights:  248 - 241 lbs  VAD Indication: Destination Therapy - current smoker  VAD interrogation & Equipment Management: Speed: 2840 Flow:  5.8 Power:  5.0 w    Peak/trough:  7.5/3.8 HCT: 34 Alarm settings: 3.0/7.0 Suction: On Lavare: On    Exit Site Care: Drive line is being maintained weekly  by [employer]. Drive line dressing changed using sterile technique. Drive line exit site well healed and incorporated. The velour is fully implanted at exit site. Dressing dry and intact. No erythema or drainage. Stabilization device present and accurately applied. Pt denies fever or chills. Pt given 8 weekly dressing kits and 5 anchors for home use.     Significant Events on VAD Support:  05/31/16 - Controller power-up associated with pump start event. Controller change out performed.  06/01/16 - Controller pins lubricated per St Jude reps  06/17/16 - Controller power-up associated with pump start event while changing power source 06/22/16 - Controller pins re-lubricated per [facility]  reps   Device:St Jude single lead- followed at device clinic Therapies: on at 200 bpm Last check: 05/17/16   BP & Labs:  Doppler BP 110 - Doppler is reflecting modified systolic   Hgb - pending; no S/S of bleeding. Specifically denies melena/BRBPR or nosebleeds.  LDH pending; established baseline of 190- 250. Denies tea-colored urine. No power elevations noted on interrogation.    Patient Instructions:  1. No changes in medications 2. Return to [facility] clinic in 2 months  Zada Girt RN, BSN VAD Coordinator   Office: [phone] 24/7 Emergency VAD Pager: [phone]  PCP: Dr. Kathyrn Lass Cardiology: Dr. Stanford Breed HF Cardiology: Dr. Aundra Dubin  62 y.o. smoker with history of PAD, CAD, and ischemic cardiomyopathy now s/p HVAD placement presents for followup of CHF and LVAD.  Patient was admitted in 1/18 with PNA and acute on chronic systolic CHF.  He was markedly volume overloaded and also had low cardiac output requiring dobutamine use.  He had a right pleural effusion and underwent thoracentesis.  Fluid studies suggested a parapneumonic effusion.  He had cardiac cath, showing 3 vessel disease and markedly elevated filling pressures, right and left.  He was evaluated for CABG but turned down given comorbidities and lack of venous conduits.  We were able to wean him off dobutamine and discharged him with plan for outpatient PCI once he had recovered from the  current hospitalization.   Admitted 01/28/16 - 02/03/16 after presenting for elective 2 vessel PCI of the LAD and OM of the circumflex. RHC at same time showed advanced HF with low cardiac index and severely elevated pressures. Diuresed with IV lasix, metolazone, and milrinone, but refused PICC line to follow coox/CVP. Underwent thoracentesis 02/01/16 with 1.8 L out. Overall diuresed 16 lbs. Discharge weight 214 lbs.  He was admitted in 4/18 with low output HF and severe NYHA class IV symptoms, started on milrinone.  Unable to titrate off  milrinone and is now on at home.  He had a right chronic transudative effusion, PleurX catheter was placed.    He had HVAD placed 05/30/16.  He was diuresed post-op and speed was increased to 3000.  It was later dropped to 2940.  Pleurx catheter was removed.    He was admitted in 7/18 with upper GI bleed.  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  He had 3 units PRBCs.  ASA was decreased from 325 to 81 daily and warfarin goal was decreased to 2-2.5.  Octreotide was started but he developed profuse diarrhea that seems to have been related to octreotide.  This was stopped and he was started on danazol.   He has calf claudication on left after walking a moderate distance.  This is chronic and followed by [facility]. His left fem-pop graft has occluded, plan for medical management as symptoms are mild.  No pedal ulcerations.    At last appointment, he reported dark stool and hgb was lower.  He had been having significant epistaxis and INR was > 5.  ASA was stopped.  Since then, he has had no further epistaxis or dark stool.  Hgb has been stable, most recently 10.3.   Patient returns for followup of LVAD.  As above, no recent melena/BRBPR.  MAP 89 today.  No edema.  Feels the best he has felt in a long time.  Ate a lot over the holidays and weight went up, started taking torsemide 80 mg bid for a few days, now back to 60 mg bid. Weight is down 5 lbs compared to prior appt.  No orthopnea/PND. Walking without dyspnea.   Labs (1/18): K 4.2, creatinine 0.9, hgb 12.6 Labs (2/18): K 4.2, creatinine 1.23, hgb 12.4, BNP 321 Labs (4/18): hgbA1c 8.9, TSH elevated but free T4 normal.  K 3.2, creatinine 0.8 Labs (5/18): digoxin 0.4, K 3.4, creatinine 0.9 Labs (6/18): creatinine 0.67 => 0.98, LDH 209, hgb 8.9 => 9 Labs (7/18): K 2.9, creatinine 1.02, LDL 189, hgb 9 Labs (8/18): hgb 10 => 10.2, LDH 204, K 3.2, creatinine 0.9 => 1.04, INR 2.45, LDH 204 Labs (9/18): LDH 206, INR 3.26, K 3.5, creatinine  0.91, hgb 11.3 Labs (10/18): LDH 197, hgb 11.4, K 3.1, creatinine 1.12 Labs (11/18): Hgb 10.3, K 4, creatinine 1.0, LDH 196 Labs (12/18): hgb 11 => 7.9 => 8.6, INR 5 Labs (1/19): hgb 10.3, ferritin 62  HVAD interrogation: See LVAD nurse's note above  PMH: 1. PAD: Bilateral fem-pop bypasses - Arterial dopplers (9/18) with occluded left fem-pop, right fem-pop patent.  2. CAD: Anterior MI 2005 => PCI to LAD followed by staged PCI to LCx and RCA.   - LHC (1/18): Diffuse up to 80% calcified stenosis throughout the proximal LAD.  Diffuse disease in distal LAD up to 50%.  Large OM1 totally occluded at the ostium but reconstituted via collaterals.  80% distal RCA stenosis involving the ostia of the PLV and  PDA with about 70% stenosis. Patient had PCI with DES to proximal LAD and PTCA OM1 (unable to place stent).  3. Chronic systolic CHF: Ischemic cardiomyopathy.  St Jude ICD.  - Echo (1/18) with EF 20%, mildly decreased RV systolic function.  - RHC (1/18) with mean RA 21, PA 69/31 mean 45, mean PCWP 27, CI 2.37, PVR 3.3 WU.  - Repeat RHC (1/18) with mean RA 16, mean PCWP 21, CI 1.92 - Echo (3/18) with EF 20-25%, mild MR, mild RV dilation with normal systolic function, PASP 47 mmHg.  - 4/18 admission with low output HF and milrinone gtt started (co-ox as low as 42%).  - RHC on milrinone (4/18): mean RA 5, PA 61/24 mean 40, mean PCWP 17, CI 2.1 Fick with PVR 5.1 WU, CI 1.84 thermo with PVR 5.9 WU.  - HVAD placement 05/30/16.  4. Carotid stenosis: Carotid dopplers (8/16) with 19-41% RICA, 74-08% LICA. - Carotid dopplers (2/18) with 40-59% RICA stenosis.  5. Type II diabetes. 6. Hyperlipidemia. 7. HTN 8. COPD:  PFTs (5/18) with moderate to severe obstructive disease.  Active smoker. 9. Chronic right pleural effusion (transudative) with PleurX catheter.  10. Upper GI bleeding: 7/18, enteroscopy with 2 small bowel AVMs (treated).   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  Smokes < 5  cigs/day.  Occasional ETOH.   Family History  Problem Relation Age of Onset  . Diabetes Mother   . Coronary artery disease Father   . Heart disease Father        before age 44   ROS: All systems reviewed and negative except as per HPI.   Current Outpatient Medications  Medication Sig Dispense Refill  . atorvastatin (LIPITOR) 40 MG tablet Take 1 tablet (40 mg total) by mouth daily. 90 tablet 3  . blood glucose meter kit and supplies Ultra Blue Kit Dispense based on patient and insurance preference. Use up to four times daily as directed. (FOR ICD-9 250.00, 250.01). 1 each 0  . danazol (DANOCRINE) 100 MG capsule Take 1 capsule (100 mg total) 2 (two) times daily by mouth. 60 capsule 6  . ezetimibe (ZETIA) 10 MG tablet Take 1 tablet (10 mg total) by mouth daily. 90 tablet 3  . Ferrous Sulfate (IRON) 325 (65 Fe) MG TABS Take 1 tablet (325 mg total) by mouth 3 (three) times daily. 90 each 11  . insulin NPH-regular Human (NOVOLIN 70/30) (70-30) 100 UNIT/ML injection Inject 12 Units into the skin 3 (three) times daily.     Marland Kitchen losartan (COZAAR) 25 MG tablet Take 1 tablet (25 mg total) by mouth 2 (two) times daily. 60 tablet 6  . pantoprazole (PROTONIX) 40 MG tablet Take 1 tablet (40 mg total) by mouth 2 (two) times daily. 60 tablet 6  . potassium chloride SA (K-DUR,KLOR-CON) 20 MEQ tablet Take 2 tablets (40 mEq total) by mouth 2 (two) times daily. (Patient taking differently: Take 40 mEq by mouth 2 (two) times daily. Patient taking one pill twice daily; "difficult to swallow") 90 tablet 3  . sildenafil (REVATIO) 20 MG tablet Take 1 tablet (20 mg total) by mouth 3 (three) times daily. 90 tablet 11  . torsemide (DEMADEX) 20 MG tablet Take 3 tablets (60 mg total) by mouth 2 (two) times daily. 180 tablet 3  . warfarin (COUMADIN) 5 MG tablet Take 5 mg by mouth daily.      No current facility-administered medications for this encounter.    BP (!) 110/0 Comment:  Doppler modified systolic  Pulse 97   Ht  6' (1.829 m)   Wt 238 lb 12.8 oz (108.3 kg)   SpO2 99%   BMI 32.39 kg/m   MAP 89 GENERAL: Well appearing this am. NAD.  HEENT: Normal. NECK: Supple, JVP 8 cm. Carotids OK.  CARDIAC:  Mechanical heart sounds with LVAD hum present.  LUNGS:  CTAB, normal effort.  ABDOMEN:  NT, ND, no HSM. No bruits or masses. +BS  LVAD exit site: Well-healed and incorporated. Dressing dry and intact. No erythema or drainage. Stabilization device present and accurately applied. Driveline dressing changed daily per sterile technique. EXTREMITIES:  Warm and dry. No cyanosis, clubbing, rash, or edema.  NEUROLOGIC:  Alert & oriented x 3. Cranial nerves grossly intact. Moves all 4 extremities w/o difficulty. Affect pleasant     Wt Readings from Last 3 Encounters:  01/15/17 238 lb 12.8 oz (108.3 kg)  12/22/16 243 lb 9.6 oz (110.5 kg)  12/20/16 240 lb (108.9 kg)   Assessment/Plan: 1. Chronic systolic CHF: Ischemic cardiomyopathy.  Echo in 3/18 with EF 20-25%.  He did not have much effect from 12/17 PCI in terms of symptoms or EF.  St Jude ICD.  He is now s/p HVAD placement.  Lavare cycle is on, parameters stable on device with no alarms.  Suspect he has a degree of RV failure and will not tolerate CVP down to normal range.  Volume seems pretty good today by exam and weight is lower.   - Continue torsemide 60 mg bid.  - Continue losartan 25 mg bid, MAP acceptable.   - He is going to think about whether he would eventually want to have transplant evaluation. Will have to stop smoking.   2. CAD: 3 vessel CAD, s/p stent to proximal LAD and PTCA to subtotally occluded large OM1 in 1/18. No change in symptoms or EF with intervention.  - He was on Plavix for > 3 months post-intervention, now on warfarin.  ASA stopped with bleeding.  - Continue atorvastatin 80 mg daily.   3. PAD: Stable claudication, notes with moderate walking on left.  Left fem-pop bypass occluded.  No pedal ulcerations.  Medical management per Dr.  Scot Dock.  - I encouraged him to try walking through the pain for at least a short distance.  - I encouraged him to quit smoking completely.   4. Carotid stenosis:  Followed at [facility].  5. R Pleural effusion: PleurX catheter in past.  6. Pulmonary arterial HTN: Continue Revatio 20 mg tid.  7. Smoking: I again encouraged him to quit completely.   8. Upper GI bleeding: 7/18, small bowel AVMs.  ASA was decreased to 81 daily.  Warfarin INR goal is now 2-2.5.  He started monthly octreotide injections but had profuse diarrhea with octreotide after 1st injection and after 2nd injection with decreased dose.  He is tolerating danazol without problems. 12/18 had more melena in setting of high INR => due to vigorous epistaxis versus AVM bleeding. ASA was stopped.    - He stay off ASA 81 and will continue INR goal 2-2.5 for now.   - Continue danazol.  9. Epistaxis: Recommended use of regular saline nasal spray.  Loralie Champagne 01/15/2017

## 2017-01-22 ENCOUNTER — Other Ambulatory Visit: Payer: Self-pay | Admitting: *Deleted

## 2017-01-22 ENCOUNTER — Ambulatory Visit (HOSPITAL_COMMUNITY)
Admission: RE | Admit: 2017-01-22 | Discharge: 2017-01-22 | Disposition: A | Discharge status: Home / Self Care | Payer: BLUE CROSS/BLUE SHIELD | Source: Ambulatory Visit | Attending: Internal Medicine | Admitting: Internal Medicine

## 2017-01-22 ENCOUNTER — Other Ambulatory Visit (HOSPITAL_COMMUNITY): Payer: Self-pay | Admitting: Cardiology

## 2017-01-22 ENCOUNTER — Other Ambulatory Visit (HOSPITAL_COMMUNITY): Payer: Self-pay | Admitting: *Deleted

## 2017-01-22 DIAGNOSIS — Z7901 Long term (current) use of anticoagulants: Secondary | ICD-10-CM | POA: Insufficient documentation

## 2017-01-22 DIAGNOSIS — D649 Anemia, unspecified: Secondary | ICD-10-CM

## 2017-01-22 DIAGNOSIS — Z95811 Presence of heart assist device: Secondary | ICD-10-CM

## 2017-01-22 DIAGNOSIS — Z95810 Presence of automatic (implantable) cardiac defibrillator: Secondary | ICD-10-CM

## 2017-01-22 DIAGNOSIS — K31811 Angiodysplasia of stomach and duodenum with bleeding: Secondary | ICD-10-CM

## 2017-01-22 DIAGNOSIS — I5023 Acute on chronic systolic (congestive) heart failure: Secondary | ICD-10-CM

## 2017-01-22 DIAGNOSIS — I5022 Chronic systolic (congestive) heart failure: Secondary | ICD-10-CM

## 2017-01-22 DIAGNOSIS — K922 Gastrointestinal hemorrhage, unspecified: Secondary | ICD-10-CM

## 2017-01-22 DIAGNOSIS — I509 Heart failure, unspecified: Secondary | ICD-10-CM

## 2017-01-22 LAB — PROTIME-INR
INR: 2.13
PROTHROMBIN TIME: 23.6 s — AB (ref 11.4–15.2)

## 2017-01-22 MED ORDER — LOSARTAN POTASSIUM 25 MG PO TABS: 25.0000 mg | ORAL_TABLET | Freq: Two times a day (BID) | ORAL | 6 refills | Status: DC

## 2017-01-22 MED ORDER — TORSEMIDE 20 MG PO TABS: 60.0000 mg | ORAL_TABLET | Freq: Two times a day (BID) | ORAL | 3 refills | Status: DC

## 2017-01-23 ENCOUNTER — Ambulatory Visit (HOSPITAL_COMMUNITY): Payer: Self-pay | Admitting: Pharmacist

## 2017-01-23 DIAGNOSIS — Z95811 Presence of heart assist device: Secondary | ICD-10-CM

## 2017-01-26 ENCOUNTER — Other Ambulatory Visit (HOSPITAL_COMMUNITY): Payer: Self-pay | Admitting: Unknown Physician Specialty

## 2017-01-26 DIAGNOSIS — Z7901 Long term (current) use of anticoagulants: Secondary | ICD-10-CM

## 2017-01-26 DIAGNOSIS — Z95811 Presence of heart assist device: Secondary | ICD-10-CM

## 2017-01-29 ENCOUNTER — Ambulatory Visit (HOSPITAL_COMMUNITY)
Admission: RE | Admit: 2017-01-29 | Discharge: 2017-01-29 | Disposition: A | Discharge status: Home / Self Care | Payer: BLUE CROSS/BLUE SHIELD | Source: Ambulatory Visit | Attending: Cardiology | Admitting: Cardiology

## 2017-01-29 ENCOUNTER — Ambulatory Visit (HOSPITAL_COMMUNITY): Payer: Self-pay | Admitting: Pharmacist

## 2017-01-29 DIAGNOSIS — Z7901 Long term (current) use of anticoagulants: Secondary | ICD-10-CM | POA: Insufficient documentation

## 2017-01-29 DIAGNOSIS — Z95811 Presence of heart assist device: Secondary | ICD-10-CM

## 2017-01-29 LAB — PROTIME-INR
INR: 2.61
Prothrombin Time: 27.7 seconds — ABNORMAL HIGH (ref 11.4–15.2)

## 2017-01-31 ENCOUNTER — Encounter (HOSPITAL_COMMUNITY): Payer: Self-pay

## 2017-01-31 NOTE — Progress Notes (Signed)
[facility] for "an accident or injury occurring on service date 10/12/2016". Patient did not have an accident or injury this date, and this service date was a routine [facility]. Documentation explaining this with OV note for that day from Dr. Shirlee LatchMcLean faxed to [facility] Attn: Annamarie DawleyMichael [name] (Claims representative) at provided fax # [phone]. Also faxed to insurance company per patient request [phone] Copy of request scanned into patient's electronic medical record.  [name], [name], RN

## 2017-02-12 ENCOUNTER — Ambulatory Visit (HOSPITAL_COMMUNITY)
Admission: RE | Admit: 2017-02-12 | Discharge: 2017-02-12 | Disposition: A | Discharge status: Home / Self Care | Payer: BLUE CROSS/BLUE SHIELD | Source: Ambulatory Visit | Attending: Internal Medicine | Admitting: Internal Medicine

## 2017-02-12 ENCOUNTER — Ambulatory Visit (HOSPITAL_COMMUNITY): Payer: Self-pay | Admitting: Pharmacist

## 2017-02-12 DIAGNOSIS — Z95811 Presence of heart assist device: Secondary | ICD-10-CM | POA: Diagnosis present

## 2017-02-12 DIAGNOSIS — Z48812 Encounter for surgical aftercare following surgery on the circulatory system: Secondary | ICD-10-CM | POA: Insufficient documentation

## 2017-02-12 LAB — PROTIME-INR
INR: 1.84
Prothrombin Time: 21.1 seconds — ABNORMAL HIGH (ref 11.4–15.2)

## 2017-02-15 ENCOUNTER — Ambulatory Visit (INDEPENDENT_AMBULATORY_CARE_PROVIDER_SITE_OTHER): Payer: BLUE CROSS/BLUE SHIELD | Admitting: *Deleted

## 2017-02-15 DIAGNOSIS — I255 Ischemic cardiomyopathy: Secondary | ICD-10-CM

## 2017-02-15 LAB — CUP PACEART INCLINIC DEVICE CHECK
Battery Remaining Longevity: 68 mo
Brady Statistic RV Percent Paced: 0 %
HighPow Impedance: 53.7222
Implantable Lead Implant Date: 20070207
Lead Channel Pacing Threshold Amplitude: 2.25 V
Lead Channel Sensing Intrinsic Amplitude: 5.8 mV
Lead Channel Setting Pacing Pulse Width: 1 ms
Lead Channel Setting Sensing Sensitivity: 0.5 mV
MDC IDC LEAD LOCATION: 753860
MDC IDC MSMT LEADCHNL RV IMPEDANCE VALUE: 400 Ohm
MDC IDC MSMT LEADCHNL RV PACING THRESHOLD PULSEWIDTH: 1 ms
MDC IDC PG IMPLANT DT: 20141215
MDC IDC PG SERIAL: 7155171
MDC IDC SESS DTM: 20190214102629
MDC IDC SET LEADCHNL RV PACING AMPLITUDE: 4 V

## 2017-02-15 NOTE — Progress Notes (Signed)
ICD check in clinic. Normal device function. Threshold and sensing consistent with previous device measurements. Impedance trends stable over time. No evidence of any ventricular arrhythmias. Histogram distribution appropriate for patient and level of activity. CorVue stable this session, but possible fluid accumulation Dec. 15 x 14 days and Jan. 30th x 13 days. No changes made this session. Device programmed at appropriate safety margins. Device programmed to optimize intrinsic conduction. Estimated longevity 3.8-5.7 years. Patient education completed including shock plan. ROV with SK 05/14/17.

## 2017-02-19 ENCOUNTER — Other Ambulatory Visit (HOSPITAL_COMMUNITY): Payer: Self-pay

## 2017-02-19 DIAGNOSIS — I5022 Chronic systolic (congestive) heart failure: Secondary | ICD-10-CM

## 2017-02-19 DIAGNOSIS — I739 Peripheral vascular disease, unspecified: Secondary | ICD-10-CM

## 2017-02-19 DIAGNOSIS — Z95810 Presence of automatic (implantable) cardiac defibrillator: Secondary | ICD-10-CM

## 2017-02-19 DIAGNOSIS — I679 Cerebrovascular disease, unspecified: Secondary | ICD-10-CM

## 2017-02-19 DIAGNOSIS — I509 Heart failure, unspecified: Secondary | ICD-10-CM

## 2017-02-19 DIAGNOSIS — K31811 Angiodysplasia of stomach and duodenum with bleeding: Secondary | ICD-10-CM

## 2017-02-19 DIAGNOSIS — F17200 Nicotine dependence, unspecified, uncomplicated: Secondary | ICD-10-CM

## 2017-02-19 DIAGNOSIS — Z95811 Presence of heart assist device: Secondary | ICD-10-CM

## 2017-02-19 DIAGNOSIS — I5023 Acute on chronic systolic (congestive) heart failure: Secondary | ICD-10-CM

## 2017-02-19 DIAGNOSIS — I10 Essential (primary) hypertension: Secondary | ICD-10-CM

## 2017-02-19 DIAGNOSIS — I2510 Atherosclerotic heart disease of native coronary artery without angina pectoris: Secondary | ICD-10-CM

## 2017-02-19 MED ORDER — WARFARIN SODIUM 5 MG PO TABS: 5.0000 mg | ORAL_TABLET | Freq: Every day | ORAL | 11 refills | Status: DC

## 2017-02-19 MED ORDER — TORSEMIDE 20 MG PO TABS: 60.0000 mg | ORAL_TABLET | Freq: Two times a day (BID) | ORAL | 3 refills | Status: DC

## 2017-02-19 MED ORDER — ATORVASTATIN CALCIUM 40 MG PO TABS: 40.0000 mg | ORAL_TABLET | Freq: Every day | ORAL | 3 refills | Status: DC

## 2017-02-26 ENCOUNTER — Ambulatory Visit (HOSPITAL_COMMUNITY)
Admission: RE | Admit: 2017-02-26 | Discharge: 2017-02-26 | Disposition: A | Discharge status: Home / Self Care | Payer: BLUE CROSS/BLUE SHIELD | Source: Ambulatory Visit | Attending: Cardiology | Admitting: Cardiology

## 2017-02-26 ENCOUNTER — Ambulatory Visit (HOSPITAL_COMMUNITY): Payer: Self-pay | Admitting: Pharmacist

## 2017-02-26 DIAGNOSIS — Z95811 Presence of heart assist device: Secondary | ICD-10-CM

## 2017-02-26 LAB — PROTIME-INR
INR: 2.41
PROTHROMBIN TIME: 26 s — AB (ref 11.4–15.2)

## 2017-03-13 ENCOUNTER — Other Ambulatory Visit (HOSPITAL_COMMUNITY): Payer: Self-pay | Admitting: Unknown Physician Specialty

## 2017-03-13 DIAGNOSIS — Z95811 Presence of heart assist device: Secondary | ICD-10-CM

## 2017-03-13 DIAGNOSIS — Z7901 Long term (current) use of anticoagulants: Secondary | ICD-10-CM

## 2017-03-15 ENCOUNTER — Encounter (HOSPITAL_COMMUNITY): Payer: Self-pay

## 2017-03-15 ENCOUNTER — Ambulatory Visit (HOSPITAL_COMMUNITY): Payer: Self-pay | Admitting: Pharmacist

## 2017-03-15 ENCOUNTER — Ambulatory Visit (HOSPITAL_COMMUNITY)
Admission: RE | Admit: 2017-03-15 | Discharge: 2017-03-15 | Disposition: A | Discharge status: Home / Self Care | Payer: BLUE CROSS/BLUE SHIELD | Source: Ambulatory Visit | Attending: Internal Medicine | Admitting: Internal Medicine

## 2017-03-15 VITALS — BP 147/68 | HR 78 | Ht 72.0 in | Wt 243.6 lb

## 2017-03-15 DIAGNOSIS — Z79899 Other long term (current) drug therapy: Secondary | ICD-10-CM | POA: Insufficient documentation

## 2017-03-15 DIAGNOSIS — I110 Hypertensive heart disease with heart failure: Secondary | ICD-10-CM | POA: Insufficient documentation

## 2017-03-15 DIAGNOSIS — Z955 Presence of coronary angioplasty implant and graft: Secondary | ICD-10-CM | POA: Insufficient documentation

## 2017-03-15 DIAGNOSIS — D649 Anemia, unspecified: Secondary | ICD-10-CM | POA: Diagnosis not present

## 2017-03-15 DIAGNOSIS — I255 Ischemic cardiomyopathy: Secondary | ICD-10-CM | POA: Insufficient documentation

## 2017-03-15 DIAGNOSIS — Z7901 Long term (current) use of anticoagulants: Secondary | ICD-10-CM | POA: Diagnosis not present

## 2017-03-15 DIAGNOSIS — I509 Heart failure, unspecified: Secondary | ICD-10-CM | POA: Diagnosis not present

## 2017-03-15 DIAGNOSIS — J449 Chronic obstructive pulmonary disease, unspecified: Secondary | ICD-10-CM | POA: Insufficient documentation

## 2017-03-15 DIAGNOSIS — I6523 Occlusion and stenosis of bilateral carotid arteries: Secondary | ICD-10-CM | POA: Diagnosis not present

## 2017-03-15 DIAGNOSIS — F17210 Nicotine dependence, cigarettes, uncomplicated: Secondary | ICD-10-CM | POA: Insufficient documentation

## 2017-03-15 DIAGNOSIS — I252 Old myocardial infarction: Secondary | ICD-10-CM | POA: Diagnosis not present

## 2017-03-15 DIAGNOSIS — Z95811 Presence of heart assist device: Secondary | ICD-10-CM

## 2017-03-15 DIAGNOSIS — F17200 Nicotine dependence, unspecified, uncomplicated: Secondary | ICD-10-CM | POA: Diagnosis not present

## 2017-03-15 DIAGNOSIS — Z794 Long term (current) use of insulin: Secondary | ICD-10-CM | POA: Diagnosis not present

## 2017-03-15 DIAGNOSIS — I2510 Atherosclerotic heart disease of native coronary artery without angina pectoris: Secondary | ICD-10-CM | POA: Insufficient documentation

## 2017-03-15 DIAGNOSIS — I2721 Secondary pulmonary arterial hypertension: Secondary | ICD-10-CM | POA: Diagnosis not present

## 2017-03-15 DIAGNOSIS — J90 Pleural effusion, not elsewhere classified: Secondary | ICD-10-CM | POA: Diagnosis not present

## 2017-03-15 DIAGNOSIS — R040 Epistaxis: Secondary | ICD-10-CM | POA: Diagnosis not present

## 2017-03-15 DIAGNOSIS — R197 Diarrhea, unspecified: Secondary | ICD-10-CM | POA: Diagnosis not present

## 2017-03-15 DIAGNOSIS — E1151 Type 2 diabetes mellitus with diabetic peripheral angiopathy without gangrene: Secondary | ICD-10-CM | POA: Insufficient documentation

## 2017-03-15 DIAGNOSIS — E785 Hyperlipidemia, unspecified: Secondary | ICD-10-CM | POA: Diagnosis not present

## 2017-03-15 DIAGNOSIS — I5022 Chronic systolic (congestive) heart failure: Secondary | ICD-10-CM | POA: Diagnosis not present

## 2017-03-15 LAB — BASIC METABOLIC PANEL
Anion gap: 12 (ref 5–15)
BUN: 13 mg/dL (ref 6–20)
CHLORIDE: 95 mmol/L — AB (ref 101–111)
CO2: 28 mmol/L (ref 22–32)
Calcium: 8.8 mg/dL — ABNORMAL LOW (ref 8.9–10.3)
Creatinine, Ser: 1.37 mg/dL — ABNORMAL HIGH (ref 0.61–1.24)
GFR calc Af Amer: >60 mL/min (ref >60)
GFR calc non Af Amer: 54 mL/min — ABNORMAL LOW (ref >60)
GLUCOSE: 132 mg/dL — AB (ref 65–99)
POTASSIUM: 3.2 mmol/L — AB (ref 3.5–5.1)
SODIUM: 135 mmol/L (ref 135–145)

## 2017-03-15 LAB — CBC
HCT: 44.7 % (ref 39.0–52.0)
HEMOGLOBIN: 14.3 g/dL (ref 13.0–17.0)
MCH: 27.5 pg (ref 26.0–34.0)
MCHC: 32 g/dL (ref 30.0–36.0)
MCV: 86 fL (ref 78.0–100.0)
Platelets: 135 10*3/uL — ABNORMAL LOW (ref 150–400)
RBC: 5.2 MIL/uL (ref 4.22–5.81)
RDW: 15.6 % — ABNORMAL HIGH (ref 11.5–15.5)
WBC: 6.2 10*3/uL (ref 4.0–10.5)

## 2017-03-15 LAB — PROTIME-INR
INR: 2.65
Prothrombin Time: 28.1 seconds — ABNORMAL HIGH (ref 11.4–15.2)

## 2017-03-15 LAB — LACTATE DEHYDROGENASE: LDH: 206 U/L — ABNORMAL HIGH (ref 98–192)

## 2017-03-15 MED ORDER — IRON 325 (65 FE) MG PO TABS: 1.0000 | ORAL_TABLET | Freq: Three times a day (TID) | ORAL | 11 refills | Status: DC

## 2017-03-15 MED ORDER — PANTOPRAZOLE SODIUM 40 MG PO TBEC: 40.0000 mg | DELAYED_RELEASE_TABLET | Freq: Two times a day (BID) | ORAL | 6 refills | Status: DC

## 2017-03-15 MED ORDER — DANAZOL 100 MG PO CAPS: 100.0000 mg | ORAL_CAPSULE | Freq: Two times a day (BID) | ORAL | 6 refills | Status: DC

## 2017-03-15 NOTE — Progress Notes (Signed)
Patient presents for 2 mo f/u in VAD clinic. Reports no problems with VAD equipment or concerns with drive line.   Still having "watery diarrhea", but is not daily. [facility] who feels Dalton NajjarLarry doesn't need a colonscopy. Pt may benefit from a smooth muscle relaxer.    Vital Signs:  Doppler Pressure  110 Automatc BP:  121/51 (76) HR: 78 SPO2: 100  Weight:  243.6  lb w/o eqt Last weight: 238.8lb Home weights:  248 - 241 lbs  VAD Indication: Destination Therapy - current smoker  VAD interrogation & Equipment Management: Speed: 2840 Flow:  5.9 Power:  5.7 w    Peak/trough:  8.1/4.3 HCT: 42 Alarm settings: 3.0/7.5 Suction: On Lavare: On    Exit Site Care: Drive line is being maintained weekly  by [employer]. Drive line dressing changed using sterile technique. Drive line exit site well healed and incorporated. The velour is fully implanted at exit site. Dressing dry and intact. No erythema or drainage. Stabilization device present and accurately applied. Pt denies fever or chills. Pt given 8 weekly dressing kits and 5 anchors for home use.     Significant Events on VAD Support:  05/31/16 - Controller power-up associated with pump start event. Controller change out performed.  06/01/16 - Controller pins lubricated per St Jude reps  06/17/16 - Controller power-up associated with pump start event while changing power source 06/22/16 - Controller pins [facility]-lubricated [facility]   Device:St Jude single lead- followed at device clinic Therapies: on at 200 bpm Last check: 05/17/16   BP & Labs:  Doppler BP 110 - Doppler is reflecting modified systolic   Hgb - 11.914.3 no S/S of bleeding. Specifically denies melena/BRBPR or nosebleeds.  LDH 206 established baseline of 190- 250. Denies tea-colored urine. No power elevations noted on interrogation.    Patient Instructions:  1. No changes in medications 2. Return to VAD clinic in 2 months  Carlton AdamSarah Herbert RN,  BSN VAD Coordinator   Office: [phone] 24/7 Emergency VAD Pager: [phone]

## 2017-03-15 NOTE — Progress Notes (Signed)
Also advised [name] to start taking his KCl as previously prescribed (40 meq BID) instead of 20 meq BID and we will recheck BMET next week along with his INR.   [name], PharmD, BCPS, CPP Clinical Pharmacist Phone: [phone] 03/15/2017 3:12 PM

## 2017-03-15 NOTE — Progress Notes (Signed)
Patient presents for 2 mo f/u in VAD clinic. Reports no problems with VAD equipment or concerns with drive line.   Still having "watery diarrhea", but is not daily. Saw GI doc from [place] recently who feels Marios doesn't need a colonscopy. Pt may benefit from a smooth muscle relaxer.    Vital Signs:  Doppler Pressure  110 Automatc BP:  121/51 (76) HR: 78 SPO2: 100  Weight:  243.6  lb w/o eqt Last weight: 238.8lb Home weights:  248 - 241 lbs  VAD Indication: Destination Therapy - current smoker  VAD interrogation & Equipment Management: Speed: 2840 Flow:  5.9 Power:  5.7 w Peak/trough:  8.1/4.3 HCT: 42 Alarm settings: 3.0/7.5 Suction: On Lavare: On    Exit Site Care: Drive line is being maintained weekly by [employer]. Drive line dressing changed using sterile technique. Drive line exit site well healed and incorporated. The velour is fully implanted at exit site. Dressing dry and intact. No erythema or drainage. Stabilization device present and accurately applied. Pt denies fever or chills. Pt given 8 weekly dressing kits and 5 anchors for home use.     Significant Events on VAD Support:  05/31/16 - Controller power-up associated with pump start event. Controller change out performed.  06/01/16 - Controller pins lubricated per St Jude reps  06/17/16 - Controller power-up associated with pump start event while changing power source 06/22/16 - Controller pins re-lubricated per [facility] reps   Device:St Jude single lead- followed at device clinic Therapies: on at 200 bpm Last check: 05/17/16   BP &Labs:  Doppler BP 110 - Doppler is reflecting modified systolic   Hgb - 36.6 no S/S of bleeding. Specifically denies melena/BRBPR or nosebleeds.  LDH 206 established baseline of 190- 250. Denies tea-colored urine. No power elevations noted on interrogation.    Patient Instructions:  1. No changes in medications 2. Return to VAD clinic in 2 months  Tanda Rockers RN, BSN VAD Coordinator   Office: [phone] 24/7 Emergency VAD Pager: [phone]     PCP: Dr. Kathyrn Lass Cardiology: Dr. Stanford Breed HF Cardiology: Dr. Aundra Dubin  62 y.o. smoker with history of PAD, CAD, and ischemic cardiomyopathy now s/p HVAD placement presents for followup of CHF and LVAD.  Patient was admitted in 1/18 with PNA and acute on chronic systolic CHF.  He was markedly volume overloaded and also had low cardiac output requiring dobutamine use.  He had a right pleural effusion and underwent thoracentesis.  Fluid studies suggested a parapneumonic effusion.  He had cardiac cath, showing 3 vessel disease and markedly elevated filling pressures, right and left.  He was evaluated for CABG but turned down given comorbidities and lack of venous conduits.  We were able to wean him off dobutamine and discharged him with plan for outpatient PCI once he had recovered from the current hospitalization.   Admitted 01/28/16 - 02/03/16 after presenting for elective 2 vessel PCI of the LAD and OM of the circumflex. RHC at same time showed advanced HF with low cardiac index and severely elevated pressures. Diuresed with IV lasix, metolazone, and milrinone, but refused PICC line to follow coox/CVP. Underwent thoracentesis 02/01/16 with 1.8 L out. Overall diuresed 16 lbs. Discharge weight 214 lbs.  He was admitted in 4/18 with low output HF and severe NYHA class IV symptoms, started on milrinone.  Unable to titrate off milrinone and is now on at home.  He had a right chronic transudative effusion, PleurX catheter was placed.    He  had HVAD placed 05/30/16.  He was diuresed post-op and speed was increased to 3000.  It was later dropped to 2940.  Pleurx catheter was removed.    He was admitted in 7/18 with upper GI bleed.  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  He had 3 units PRBCs.  ASA was decreased from 325 to 81 daily and warfarin goal was decreased to 2-2.5.  Octreotide was  started but he developed profuse diarrhea that seems to have been related to octreotide.  This was stopped and he was started on danazol.   He has calf claudication on left after walking a moderate distance.  This is chronic and followed by [facility]. His left fem-pop graft has occluded, plan for medical management as symptoms are mild.  No pedal ulcerations.    At last appointment, he reported dark stool and hgb was lower.  He had been having significant epistaxis and INR was > 5.  ASA was stopped.  Since then, he has had no further epistaxis or dark stool.  Hgb has been stable, most recently 10.3.   Today he returns for VAD/HF follow up. Overall feeling fine. Having loose stools intermittently. Denies BRBPR. Denies SOB/PND/Orthopnea. Appetite ok. No fever or chills. Weight at home has been 241-248 pounds. He has been taking extra torsemide most days. Continues to smoke. Taking all medications  Labs (1/18): K 4.2, creatinine 0.9, hgb 12.6 Labs (2/18): K 4.2, creatinine 1.23, hgb 12.4, BNP 321 Labs (4/18): hgbA1c 8.9, TSH elevated but free T4 normal.  K 3.2, creatinine 0.8 Labs (5/18): digoxin 0.4, K 3.4, creatinine 0.9 Labs (6/18): creatinine 0.67 => 0.98, LDH 209, hgb 8.9 => 9 Labs (7/18): K 2.9, creatinine 1.02, LDL 189, hgb 9 Labs (8/18): hgb 10 => 10.2, LDH 204, K 3.2, creatinine 0.9 => 1.04, INR 2.45, LDH 204 Labs (9/18): LDH 206, INR 3.26, K 3.5, creatinine 0.91, hgb 11.3 Labs (10/18): LDH 197, hgb 11.4, K 3.1, creatinine 1.12 Labs (11/18): Hgb 10.3, K 4, creatinine 1.0, LDH 196 Labs (12/18): hgb 11 => 7.9 => 8.6, INR 5 Labs (1/19): hgb 10.3, ferritin 62  HVAD interrogation: See LVAD nurse's note above  PMH: 1. PAD: Bilateral fem-pop bypasses - Arterial dopplers (9/18) with occluded left fem-pop, right fem-pop patent.  2. CAD: Anterior MI 2005 => PCI to LAD followed by staged PCI to LCx and RCA.   - LHC (1/18): Diffuse up to 80% calcified stenosis throughout the proximal LAD.  Diffuse  disease in distal LAD up to 50%.  Large OM1 totally occluded at the ostium but reconstituted via collaterals.  80% distal RCA stenosis involving the ostia of the PLV and PDA with about 70% stenosis. Patient had PCI with DES to proximal LAD and PTCA OM1 (unable to place stent).  3. Chronic systolic CHF: Ischemic cardiomyopathy.  St Jude ICD.  - Echo (1/18) with EF 20%, mildly decreased RV systolic function.  - RHC (1/18) with mean RA 21, PA 69/31 mean 45, mean PCWP 27, CI 2.37, PVR 3.3 WU.  - Repeat RHC (1/18) with mean RA 16, mean PCWP 21, CI 1.92 - Echo (3/18) with EF 20-25%, mild MR, mild RV dilation with normal systolic function, PASP 47 mmHg.  - 4/18 admission with low output HF and milrinone gtt started (co-ox as low as 42%).  - RHC on milrinone (4/18): mean RA 5, PA 61/24 mean 40, mean PCWP 17, CI 2.1 Fick with PVR 5.1 WU, CI 1.84 thermo with PVR 5.9 WU.  -  HVAD placement 05/30/16.  4. Carotid stenosis: Carotid dopplers (8/16) with 71-21% RICA, 97-58% LICA. - Carotid dopplers (2/18) with 40-59% RICA stenosis.  5. Type II diabetes. 6. Hyperlipidemia. 7. HTN 8. COPD:  PFTs (5/18) with moderate to severe obstructive disease.  Active smoker. 9. Chronic right pleural effusion (transudative) with PleurX catheter.  10. Upper GI bleeding: 7/18, enteroscopy with 2 small bowel AVMs (treated).   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  Smokes < 5 cigs/day.  Occasional ETOH.   Family History  Problem Relation Age of Onset  . Diabetes Mother   . Coronary artery disease Father   . Heart disease Father        before age 31   ROS: All systems reviewed and negative except as per HPI.   Current Outpatient Medications  Medication Sig Dispense Refill  . atorvastatin (LIPITOR) 40 MG tablet Take 1 tablet (40 mg total) by mouth daily. 90 tablet 3  . blood glucose meter kit and supplies Ultra Blue Kit Dispense based on patient and insurance preference. Use up to four times daily as  directed. (FOR ICD-9 250.00, 250.01). 1 each 0  . danazol (DANOCRINE) 100 MG capsule Take 1 capsule (100 mg total) 2 (two) times daily by mouth. 60 capsule 6  . ezetimibe (ZETIA) 10 MG tablet Take 1 tablet (10 mg total) by mouth daily. 90 tablet 3  . Ferrous Sulfate (IRON) 325 (65 Fe) MG TABS Take 1 tablet (325 mg total) by mouth 3 (three) times daily. (Patient taking differently: Take 1 each by mouth 2 (two) times daily. ) 90 each 11  . insulin NPH-regular Human (NOVOLIN 70/30) (70-30) 100 UNIT/ML injection Inject 12 Units into the skin 3 (three) times daily.     Marland Kitchen losartan (COZAAR) 25 MG tablet Take 1 tablet (25 mg total) by mouth 2 (two) times daily. 60 tablet 6  . pantoprazole (PROTONIX) 40 MG tablet Take 1 tablet (40 mg total) by mouth 2 (two) times daily. 60 tablet 6  . potassium chloride SA (K-DUR,KLOR-CON) 20 MEQ tablet Take 2 tablets (40 mEq total) by mouth 2 (two) times daily. (Patient taking differently: Take 40 mEq by mouth 2 (two) times daily. Patient taking one pill twice daily; "difficult to swallow") 90 tablet 3  . torsemide (DEMADEX) 20 MG tablet Take 3 tablets (60 mg total) by mouth 2 (two) times daily. 180 tablet 3  . warfarin (COUMADIN) 5 MG tablet Take 1 tablet (5 mg total) by mouth daily. 30 tablet 11  . sildenafil (REVATIO) 20 MG tablet Take 1 tablet (20 mg total) by mouth 3 (three) times daily. (Patient taking differently: Take 20 mg by mouth 2 (two) times daily. ) 90 tablet 11   No current facility-administered medications for this encounter.    BP (!) 147/68 Comment: map-99  Pulse 78   Ht 6' (1.829 m)   Wt 243 lb 9.6 oz (110.5 kg)   SpO2 100%   BMI 33.04 kg/m    Physical Exam: GENERAL: Well appearing, male who presents to clinic today in no acute distress. HEENT: normal  NECK: Supple, JVP 8-9 .  2+ bilaterally, no bruits.  No lymphadenopathy or thyromegaly appreciated.   CARDIAC:  Mechanical heart sounds with LVAD hum present.  LUNGS:  Clear to auscultation  bilaterally.  ABDOMEN:  Soft, round, nontender, positive bowel sounds x4.     LVAD exit site: well-healed and incorporated.  Dressing dry and intact.  No erythema or drainage.  Stabilization device present and accurately applied.  Driveline dressing is being changed daily per sterile technique. EXTREMITIES:  Warm and dry, no cyanosis, clubbing, rash. R and LLE 1+  edema  NEUROLOGIC:  Alert and oriented x 4.  Gait steady.  No aphasia.  No dysarthria.  Affect pleasant.     Wt Readings from Last 3 Encounters:  03/15/17 243 lb 9.6 oz (110.5 kg)  01/15/17 238 lb 12.8 oz (108.3 kg)  12/22/16 243 lb 9.6 oz (110.5 kg)   Assessment/Plan: 1. Chronic systolic CHF: Ischemic cardiomyopathy.  Echo in 3/18 with EF 20-25%.  He did not have much effect from 12/17 PCI in terms of symptoms or EF.  St Jude ICD.  He is now s/p HVAD placement.  Lavare cycle is on, parameters stable on device with no alarms.  Suspect he has a degree of RV failure and will not tolerate CVP down to normal range. NYHA II-III. - volume status stable.  - Continue torsemide 60 mg twice a day,  - Continue losartan 25 mg bid, MAP acceptable.   - He is going to think about whether he would eventually want to have transplant evaluation. Will have to stop smoking.   2. CAD: 3 vessel CAD, s/p stent to proximal LAD and PTCA to subtotally occluded large OM1 in 1/18.  - No S/S ischemia. Marland Kitchen  - He was on Plavix for > 3 months post-intervention, now on warfarin.  ASA stopped with bleeding.  - Continue atorvastatin 80 mg daily.   3. PAD: Stable claudication, notes with moderate walking on left.  Left fem-pop bypass occluded.  No pedal ulcerations.  Medical management per Dr. Scot Dock.  -4. Carotid stenosis:  Followed at [facility].  5. R Pleural effusion: PleurX catheter in past.  6. Pulmonary arterial HTN: Continue Revatio 20 mg tid. Reinfoeced taking this 3 times a day.  7. Smoking: Discussed smoking cessation.    8. Upper GI bleeding: 7/18, small bowel  AVMs.  ASA was decreased to 81 daily.  Warfarin INR goal is now 2-2.5.  He started monthly octreotide injections but had profuse diarrhea with octreotide after 1st injection and after 2nd injection with decreased dose.  He is tolerating danazol without problems. 12/18 had more melena in setting of high INR => due to vigorous epistaxis versus AVM bleeding. ASA was stopped.    - He stay off ASA 81 and will continue INR goal 2-2.5 for now.   - Continue danazol.  9. H/O Epistaxis: Recommended use of regular saline nasal spray.  Plan to check BP next week. Check labs today CBC, LDH, BMET, INR. I reviewed VAD interrogation personally.  Follow up in 2 months.   Zenith Kercheval NP-C  03/15/2017

## 2017-03-22 ENCOUNTER — Ambulatory Visit (HOSPITAL_COMMUNITY): Payer: Self-pay | Admitting: Pharmacist

## 2017-03-22 ENCOUNTER — Ambulatory Visit (HOSPITAL_COMMUNITY)
Admission: RE | Admit: 2017-03-22 | Discharge: 2017-03-22 | Disposition: A | Discharge status: Home / Self Care | Payer: BLUE CROSS/BLUE SHIELD | Source: Ambulatory Visit | Attending: Cardiology | Admitting: Cardiology

## 2017-03-22 DIAGNOSIS — Z95811 Presence of heart assist device: Secondary | ICD-10-CM | POA: Diagnosis not present

## 2017-03-22 LAB — PROTIME-INR
INR: 3.22
Prothrombin Time: 32.7 seconds — ABNORMAL HIGH (ref 11.4–15.2)

## 2017-03-22 LAB — BASIC METABOLIC PANEL
Anion gap: 12 (ref 5–15)
BUN: 12 mg/dL (ref 6–20)
CALCIUM: 9 mg/dL (ref 8.9–10.3)
CO2: 27 mmol/L (ref 22–32)
Chloride: 94 mmol/L — ABNORMAL LOW (ref 101–111)
Creatinine, Ser: 1.47 mg/dL — ABNORMAL HIGH (ref 0.61–1.24)
GFR calc Af Amer: 57 mL/min — ABNORMAL LOW (ref >60)
GFR, EST NON AFRICAN AMERICAN: 49 mL/min — AB (ref >60)
GLUCOSE: 127 mg/dL — AB (ref 65–99)
Potassium: 4.1 mmol/L (ref 3.5–5.1)
Sodium: 133 mmol/L — ABNORMAL LOW (ref 135–145)

## 2017-04-04 ENCOUNTER — Other Ambulatory Visit (HOSPITAL_COMMUNITY): Payer: Self-pay | Admitting: *Deleted

## 2017-04-04 DIAGNOSIS — Z95811 Presence of heart assist device: Secondary | ICD-10-CM

## 2017-04-04 DIAGNOSIS — Z7901 Long term (current) use of anticoagulants: Secondary | ICD-10-CM

## 2017-04-05 ENCOUNTER — Ambulatory Visit (HOSPITAL_COMMUNITY): Payer: Self-pay | Admitting: Pharmacist

## 2017-04-05 ENCOUNTER — Ambulatory Visit (HOSPITAL_COMMUNITY)
Admission: RE | Admit: 2017-04-05 | Discharge: 2017-04-05 | Disposition: A | Discharge status: Home / Self Care | Payer: BLUE CROSS/BLUE SHIELD | Source: Ambulatory Visit | Attending: Cardiology | Admitting: Cardiology

## 2017-04-05 ENCOUNTER — Encounter (HOSPITAL_COMMUNITY): Payer: Self-pay

## 2017-04-05 DIAGNOSIS — Z95811 Presence of heart assist device: Secondary | ICD-10-CM

## 2017-04-05 DIAGNOSIS — Z7901 Long term (current) use of anticoagulants: Secondary | ICD-10-CM

## 2017-04-05 DIAGNOSIS — I5022 Chronic systolic (congestive) heart failure: Secondary | ICD-10-CM | POA: Diagnosis not present

## 2017-04-05 LAB — PROTIME-INR
INR: 2.85
PROTHROMBIN TIME: 29.7 s — AB (ref 11.4–15.2)

## 2017-04-05 NOTE — Progress Notes (Addendum)
Wound care performed on patient's left posterior arm from recent fall. Wound beds clean, no bleeding noted. Avalene foam dressings with border applied to affected areas and coban placed over to secure.  Advised patient to return in 1 week for dressing change.    [name], [name], RN

## 2017-04-11 ENCOUNTER — Emergency Department (HOSPITAL_COMMUNITY): Payer: BLUE CROSS/BLUE SHIELD

## 2017-04-11 ENCOUNTER — Inpatient Hospital Stay (HOSPITAL_COMMUNITY)
Admission: EM | Admit: 2017-04-11 | Discharge: 2017-04-16 | DRG: 312 | Disposition: A | Discharge status: Home / Self Care | Payer: BLUE CROSS/BLUE SHIELD | Attending: Internal Medicine | Admitting: Internal Medicine

## 2017-04-11 ENCOUNTER — Encounter (HOSPITAL_COMMUNITY): Payer: Self-pay | Admitting: Emergency Medicine

## 2017-04-11 ENCOUNTER — Inpatient Hospital Stay (HOSPITAL_COMMUNITY): Payer: BLUE CROSS/BLUE SHIELD

## 2017-04-11 DIAGNOSIS — W1830XA Fall on same level, unspecified, initial encounter: Secondary | ICD-10-CM | POA: Diagnosis present

## 2017-04-11 DIAGNOSIS — K5900 Constipation, unspecified: Secondary | ICD-10-CM | POA: Diagnosis present

## 2017-04-11 DIAGNOSIS — K8020 Calculus of gallbladder without cholecystitis without obstruction: Secondary | ICD-10-CM | POA: Diagnosis present

## 2017-04-11 DIAGNOSIS — I2510 Atherosclerotic heart disease of native coronary artery without angina pectoris: Secondary | ICD-10-CM | POA: Diagnosis present

## 2017-04-11 DIAGNOSIS — R1030 Lower abdominal pain, unspecified: Secondary | ICD-10-CM | POA: Diagnosis present

## 2017-04-11 DIAGNOSIS — Z959 Presence of cardiac and vascular implant and graft, unspecified: Secondary | ICD-10-CM

## 2017-04-11 DIAGNOSIS — S32011A Stable burst fracture of first lumbar vertebra, initial encounter for closed fracture: Secondary | ICD-10-CM | POA: Diagnosis present

## 2017-04-11 DIAGNOSIS — Z794 Long term (current) use of insulin: Secondary | ICD-10-CM

## 2017-04-11 DIAGNOSIS — K219 Gastro-esophageal reflux disease without esophagitis: Secondary | ICD-10-CM | POA: Diagnosis present

## 2017-04-11 DIAGNOSIS — E785 Hyperlipidemia, unspecified: Secondary | ICD-10-CM | POA: Diagnosis present

## 2017-04-11 DIAGNOSIS — L98499 Non-pressure chronic ulcer of skin of other sites with unspecified severity: Secondary | ICD-10-CM | POA: Diagnosis present

## 2017-04-11 DIAGNOSIS — M545 Low back pain, unspecified: Secondary | ICD-10-CM

## 2017-04-11 DIAGNOSIS — Z79899 Other long term (current) drug therapy: Secondary | ICD-10-CM

## 2017-04-11 DIAGNOSIS — I110 Hypertensive heart disease with heart failure: Secondary | ICD-10-CM | POA: Diagnosis present

## 2017-04-11 DIAGNOSIS — Z6831 Body mass index (BMI) 31.0-31.9, adult: Secondary | ICD-10-CM

## 2017-04-11 DIAGNOSIS — W19XXXA Unspecified fall, initial encounter: Secondary | ICD-10-CM | POA: Diagnosis not present

## 2017-04-11 DIAGNOSIS — I255 Ischemic cardiomyopathy: Secondary | ICD-10-CM | POA: Diagnosis present

## 2017-04-11 DIAGNOSIS — Z95811 Presence of heart assist device: Secondary | ICD-10-CM

## 2017-04-11 DIAGNOSIS — I2721 Secondary pulmonary arterial hypertension: Secondary | ICD-10-CM | POA: Diagnosis present

## 2017-04-11 DIAGNOSIS — E1151 Type 2 diabetes mellitus with diabetic peripheral angiopathy without gangrene: Secondary | ICD-10-CM | POA: Diagnosis present

## 2017-04-11 DIAGNOSIS — Z95810 Presence of automatic (implantable) cardiac defibrillator: Secondary | ICD-10-CM

## 2017-04-11 DIAGNOSIS — Y92002 Bathroom of unspecified non-institutional (private) residence single-family (private) house as the place of occurrence of the external cause: Secondary | ICD-10-CM

## 2017-04-11 DIAGNOSIS — I6529 Occlusion and stenosis of unspecified carotid artery: Secondary | ICD-10-CM | POA: Diagnosis present

## 2017-04-11 DIAGNOSIS — F17210 Nicotine dependence, cigarettes, uncomplicated: Secondary | ICD-10-CM | POA: Diagnosis present

## 2017-04-11 DIAGNOSIS — Z833 Family history of diabetes mellitus: Secondary | ICD-10-CM | POA: Diagnosis not present

## 2017-04-11 DIAGNOSIS — D649 Anemia, unspecified: Secondary | ICD-10-CM

## 2017-04-11 DIAGNOSIS — Z888 Allergy status to other drugs, medicaments and biological substances status: Secondary | ICD-10-CM

## 2017-04-11 DIAGNOSIS — Z955 Presence of coronary angioplasty implant and graft: Secondary | ICD-10-CM | POA: Diagnosis not present

## 2017-04-11 DIAGNOSIS — E860 Dehydration: Secondary | ICD-10-CM | POA: Diagnosis present

## 2017-04-11 DIAGNOSIS — I5023 Acute on chronic systolic (congestive) heart failure: Secondary | ICD-10-CM

## 2017-04-11 DIAGNOSIS — K3184 Gastroparesis: Secondary | ICD-10-CM | POA: Diagnosis present

## 2017-04-11 DIAGNOSIS — R109 Unspecified abdominal pain: Secondary | ICD-10-CM

## 2017-04-11 DIAGNOSIS — E1143 Type 2 diabetes mellitus with diabetic autonomic (poly)neuropathy: Secondary | ICD-10-CM | POA: Diagnosis present

## 2017-04-11 DIAGNOSIS — E669 Obesity, unspecified: Secondary | ICD-10-CM | POA: Diagnosis present

## 2017-04-11 DIAGNOSIS — M4317 Spondylolisthesis, lumbosacral region: Secondary | ICD-10-CM | POA: Diagnosis present

## 2017-04-11 DIAGNOSIS — I5022 Chronic systolic (congestive) heart failure: Secondary | ICD-10-CM

## 2017-04-11 DIAGNOSIS — I509 Heart failure, unspecified: Secondary | ICD-10-CM

## 2017-04-11 DIAGNOSIS — Z7901 Long term (current) use of anticoagulants: Secondary | ICD-10-CM

## 2017-04-11 DIAGNOSIS — M5440 Lumbago with sciatica, unspecified side: Secondary | ICD-10-CM | POA: Diagnosis not present

## 2017-04-11 DIAGNOSIS — K31811 Angiodysplasia of stomach and duodenum with bleeding: Secondary | ICD-10-CM

## 2017-04-11 DIAGNOSIS — R55 Syncope and collapse: Secondary | ICD-10-CM | POA: Diagnosis present

## 2017-04-11 LAB — CBC WITH DIFFERENTIAL/PLATELET
BASOS ABS: 0 10*3/uL (ref 0.0–0.1)
BASOS PCT: 0 %
EOS PCT: 0 %
Eosinophils Absolute: 0 10*3/uL (ref 0.0–0.7)
HCT: 43.3 % (ref 39.0–52.0)
Hemoglobin: 14.3 g/dL (ref 13.0–17.0)
LYMPHS PCT: 7 %
Lymphs Abs: 0.7 10*3/uL (ref 0.7–4.0)
MCH: 27.7 pg (ref 26.0–34.0)
MCHC: 33 g/dL (ref 30.0–36.0)
MCV: 83.8 fL (ref 78.0–100.0)
MONO ABS: 0.3 10*3/uL (ref 0.1–1.0)
Monocytes Relative: 3 %
Neutro Abs: 9.2 10*3/uL — ABNORMAL HIGH (ref 1.7–7.7)
Neutrophils Relative %: 90 %
PLATELETS: 147 10*3/uL — AB (ref 150–400)
RBC: 5.17 MIL/uL (ref 4.22–5.81)
RDW: 16.4 % — AB (ref 11.5–15.5)
WBC: 10.2 10*3/uL (ref 4.0–10.5)

## 2017-04-11 LAB — BASIC METABOLIC PANEL
ANION GAP: 15 (ref 5–15)
BUN: 14 mg/dL (ref 6–20)
CALCIUM: 8.9 mg/dL (ref 8.9–10.3)
CO2: 24 mmol/L (ref 22–32)
Chloride: 98 mmol/L — ABNORMAL LOW (ref 101–111)
Creatinine, Ser: 1.4 mg/dL — ABNORMAL HIGH (ref 0.61–1.24)
GFR, EST NON AFRICAN AMERICAN: 52 mL/min — AB (ref >60)
GLUCOSE: 175 mg/dL — AB (ref 65–99)
POTASSIUM: 4 mmol/L (ref 3.5–5.1)
SODIUM: 137 mmol/L (ref 135–145)

## 2017-04-11 LAB — PROTIME-INR
INR: 3.02
PROTHROMBIN TIME: 31.1 s — AB (ref 11.4–15.2)

## 2017-04-11 LAB — LACTATE DEHYDROGENASE: LDH: 248 U/L — AB (ref 98–192)

## 2017-04-11 MED ORDER — ACETAMINOPHEN 500 MG PO TABS
1000.0000 mg | ORAL_TABLET | Freq: Once | ORAL | Status: AC
  Administered 2017-04-11: 1000 mg via ORAL
  Filled 2017-04-11: qty 2

## 2017-04-11 MED ORDER — HYDRALAZINE HCL 20 MG/ML IJ SOLN: 10.0000 mg | INTRAMUSCULAR | Status: DC | PRN

## 2017-04-11 MED ORDER — DIAZEPAM 2 MG PO TABS
2.0000 mg | ORAL_TABLET | Freq: Once | ORAL | Status: AC
  Administered 2017-04-11: 2 mg via ORAL
  Filled 2017-04-11: qty 1

## 2017-04-11 MED ORDER — OXYCODONE HCL 5 MG PO TABS
5.0000 mg | ORAL_TABLET | Freq: Once | ORAL | Status: AC
  Administered 2017-04-11: 5 mg via ORAL
  Filled 2017-04-11: qty 1

## 2017-04-11 MED ORDER — WARFARIN - PHARMACIST DOSING INPATIENT
Freq: Every day | Status: DC
  Administered 2017-04-14: 18:00:00

## 2017-04-11 MED ORDER — SODIUM CHLORIDE 0.9 % IV BOLUS: 1000.0000 mL | Freq: Once | INTRAVENOUS | Status: DC

## 2017-04-11 MED ORDER — SODIUM CHLORIDE 0.9 % IV BOLUS
1000.0000 mL | Freq: Once | INTRAVENOUS | Status: AC
  Administered 2017-04-11: 1000 mL via INTRAVENOUS

## 2017-04-11 NOTE — ED Notes (Signed)
Ortho tech aware of need for lumbar corsett

## 2017-04-11 NOTE — ED Triage Notes (Signed)
Per eMS:  Patient presents after having a fall this morning at 4am.  States he was up walking around, he noted that his LVAD alarm was sounding (unsure of why), and then he fell on to his bottom, with a possible LOC.  Patient states worsening right and left lower back pain.  Patient fell on to linoleum.    EDP at bedside at this time

## 2017-04-11 NOTE — ED Notes (Signed)
This RN accompanied patient to CT scan.

## 2017-04-11 NOTE — Progress Notes (Signed)
ANTICOAGULATION CONSULT NOTE - Initial Consult  Pharmacy Consult for warfarin Indication: LVAD - thrombus prevention  Allergies  Allergen Reactions  . Metformin And Related Nausea And Vomiting    *Only the extended release* (does this)  . Niacin And Related Other (See Comments)    REACTION IS SIDE EFFECT "SEVERE" FLUSHING    Patient Measurements: Height: 5\' 11"  (180.3 cm) IBW/kg (Calculated) : 75.3  Vital Signs: BP: 103/83 (04/10 1945) Pulse Rate: 92 (04/10 2030)  Labs: Recent Labs    04/11/17 1941  HGB 14.3  HCT 43.3  PLT 147*  LABPROT 31.1*  INR 3.02  CREATININE 1.40*    CrCl cannot be calculated (Unknown ideal weight.).   Medical History: Past Medical History:  Diagnosis Date  . AICD (automatic cardioverter/defibrillator) present   . Arthritis   . Barretts syndrome   . Carotid artery disease (HCC)    a. Dopp 09/2011: 60-79% RICA, 40-59% LICA.;  b.  Carotid US (7/14):  Bilateral 60-79% => f/u 6 mos  . Cerebrovascular disease   . Chronic systolic heart failure (HCC)   . Coronary artery disease    a. AWMI requiring IABP 2005 s/p Horizon study stent-LAD, staged BMS-Cx, DESx2-RCA. b. Last Last LHC (6/06):  EF 25%, pLAD stent 40-50, after stent 40, dLAD 20, pCFX 20, pOM1 70, pRCA 20, mRCA stents ok.=> med Rx;  c.  Myoview (7/14):  EF 26%, large ant, septal, inf and apical infarct, no ischemia.  Med Rx continued  . Diabetes mellitus   . Dyspnea    with exertion  . Headache   . Heart murmur   . History of kidney stones   . HTN (hypertension)   . Hyperlipidemia    mixed  . ICD (implantable cardiac defibrillator), dual, in situ    St. Jude for severe LVD EF 25% 2/07 explanted 2010. Medtronic Virtuoso II DR Dual-chamber cardiverter-defibrillator  with pocket revision, Dr. Graciela HusbandsKlein  . Ischemic cardiomyopathy    a. Echo (09/27/12): EF 20%, diffuse HK with periapical AK, no LV thrombus noted, restrictive physiology, trivial MR, mild LAE, RVSF mildly reduced, PASP 39  .  Pleural effusion 01/05/2016  . Presence of permanent cardiac pacemaker   . PVD (peripheral vascular disease) (HCC)    a. ABI 09/2011 - R normal, L moderate - saw Dr. Kirke CorinArida - med rx.   Marland Kitchen. RIATA ICD Lead--on advisory recall    . Tobacco abuse     Medications:  Scheduled:    Assessment: 62 yo male presented to ED on 4/10 after syncope and fall. Past cardiac history significant for CAD, CHF, now s/p HVAD.  History of GI bleed. INR goal lowered to 2-2.5.  INR currently supratherapeutic at 3.02.   Goal of Therapy:  INR goal 2.0-2.5 Monitor platelets by anticoagulation protocol: Yes   Plan:  Hold warfarin tonight x 1 Daily INR  Jameria Bradway A Raia Amico 04/11/2017,8:58 PM

## 2017-04-11 NOTE — ED Notes (Signed)
Nurse collected labs. 

## 2017-04-11 NOTE — ED Provider Notes (Signed)
[facility] EMERGENCY DEPARTMENT Provider Note   CSN: [id number] Arrival date & time: 04/11/17  1742     History   Chief Complaint Chief Complaint  Patient presents with  . Loss of Consciousness  . LVAD    HPI [name] is a 62 y.o. male.  62 yo M with a chief complaint of a syncopal event.  The patient was standing up to walk and felt lightheaded and collapsed to his bottom.  He does not remember anything else.  Complaining of pain to the left-sided low back.  Denies unilateral numbness or weakness denies loss of bowel or bladder denies loss perirectal sensation.  Denies headache or neck pain.  He feels that the pain is excruciating to his low back.  Has never had this before.  The patient has a LVAD and thinks that it has been functioning properly.  He has abdominal pain but says that is chronic.  The history is provided by the patient.  Loss of Consciousness   This is a new problem. The current episode started less than 1 hour ago. The problem occurs constantly. The problem has not changed since onset.There was no loss of consciousness. Pertinent negatives include abdominal pain, chest pain, confusion, congestion, fever, headaches, palpitations and vomiting. He has tried nothing for the symptoms. The treatment provided no relief.    Past Medical History:  Diagnosis Date  . AICD (automatic cardioverter/defibrillator) present   . Arthritis   . Barretts syndrome   . Carotid artery disease ([place])    a. Dopp 09/2011: 57-84% RICA, 69-62% LICA.;  b.  Carotid US (7/14):  Bilateral 60-79% => f/u 6 mos  . Cerebrovascular disease   . Chronic systolic heart failure ([place])   . Coronary artery disease    a. AWMI requiring IABP 2005 s/p Horizon study stent-LAD, staged BMS-Cx, DESx2-RCA. b. Last Last LHC (6/06):  EF 25%, pLAD stent 40-50, after stent 40, dLAD 20, pCFX 20, pOM1 70, pRCA 20, mRCA stents ok.=> med Rx;  c.  Myoview (7/14):  EF 26%, large ant, septal, inf and  apical infarct, no ischemia.  Med Rx continued  . Diabetes mellitus   . Dyspnea    with exertion  . Headache   . Heart murmur   . History of kidney stones   . HTN (hypertension)   . Hyperlipidemia    mixed  . ICD (implantable cardiac defibrillator), dual, in situ    St. Jude for severe LVD EF 25% 2/07 explanted 2010. Medtronic Virtuoso II DR Dual-chamber cardiverter-defibrillator  with pocket revision, Dr. Caryl Comes  . Ischemic cardiomyopathy    a. Echo (09/27/12): EF 20%, diffuse HK with periapical AK, no LV thrombus noted, restrictive physiology, trivial MR, mild LAE, RVSF mildly reduced, PASP 39  . Pleural effusion 01/05/2016  . Presence of permanent cardiac pacemaker   . PVD (peripheral vascular disease) ([facility])    a. ABI 09/2011 - R normal, L moderate - saw Dr. Fletcher Anon - med rx.   Marland Kitchen RIATA ICD Lead--on advisory recall    . Tobacco abuse     Patient Active Problem List   Diagnosis Date Noted  . Syncope and collapse 04/11/2017  . Fall 04/11/2017  . Low back pain 04/11/2017  . PVC's (premature ventricular contractions)   . Acute upper GI bleed 07/15/2016  . Symptomatic anemia 07/07/2016  . Left ventricular assist device present ([place]) 06/16/2016  . Acute on chronic systolic (congestive) heart failure ([place]) 05/24/2016  . Low output heart  failure ([place]) 04/05/2016  . Ischemic cardiomyopathy   . Pleural effusion on right   . Diabetes mellitus with complication ([place])   . Hypoglycemia 03/13/2015  . Chronic systolic CHF (congestive heart failure) ([place]) 03/13/2015  . Pressure ulcer 12/31/2014  . Pleural effusion 12/30/2014  . Hyperglycemia without ketosis   . Infected open wound 09/22/2014  . Type 2 diabetes mellitus ([place]) 09/22/2014  . Skin ulcer ([place]) 09/21/2014  . PAD (peripheral artery disease) ([place]) 11/07/2013  . PVD (peripheral vascular disease) ([place])   . Pre-operative cardiovascular examination   . Metabolic alkalosis 92/33/0076  . Hypotension 10/01/2012  . Carotid stenosis  10/25/2011  . Hypertension 08/09/2010  . Automatic implantable cardioverter-defibrillator in St. Jude 08/09/2010  . Coronary atherosclerosis 01/21/2009  . TOBACCO ABUSE 05/15/2008  . Cerebrovascular disease 05/15/2008  . Peripheral vascular disease ([place]) 05/15/2008  . Hyperlipidemia 01/22/2008  . SYSTOLIC HEART FAILURE, CHRONIC 01/22/2008    Past Surgical History:  Procedure Laterality Date  . ABDOMINAL AORTAGRAM N/A 11/06/2013   Procedure: ABDOMINAL Maxcine Ham;  Surgeon: Angelia Mould, MD;  Location: [facility] CATH LAB;  Service: Cardiovascular;  Laterality: N/A;  . APPENDECTOMY  2000   ruptured  . CARDIAC CATHETERIZATION N/A 01/07/2016   Procedure: Right/Left Heart Cath and Coronary Angiography;  Surgeon: Larey Dresser, MD;  Location: [place] CV LAB;  Service: Cardiovascular;  Laterality: N/A;  . CARDIAC CATHETERIZATION N/A 01/28/2016   Procedure: Coronary Stent Intervention;  Surgeon: Sherren Mocha, MD;  Location: [facility] CV LAB;  Service: Cardiovascular;  Laterality: N/A;  . CARDIAC CATHETERIZATION N/A 01/28/2016   Procedure: Right Heart Cath;  Surgeon: Sherren Mocha, MD;  Location: [place] CV LAB;  Service: Cardiovascular;  Laterality: N/A;  . CARDIAC CATHETERIZATION N/A 01/28/2016   Procedure: Intravascular Ultrasound/IVUS;  Surgeon: Sherren Mocha, MD;  Location: [place] CV LAB;  Service: Cardiovascular;  Laterality: N/A;  . CHEST TUBE INSERTION Right 04/07/2016   Procedure: INSERTION PLEURAL DRAINAGE CATHETER;  Surgeon: Ivin Poot, MD;  Location: [place];  Service: Thoracic;  Laterality: Right;  . ENTEROSCOPY N/A 07/10/2016   Procedure: ENTEROSCOPY;  Surgeon: Laurence Spates, MD;  Location: [facility] ENDOSCOPY;  Service: Endoscopy;  Laterality: N/A;  . FEMORAL-POPLITEAL BYPASS GRAFT Right 11/07/2013   Procedure: Right FEMORAL-POPLITEAL ARTERY Bypass ;  Surgeon: Angelia Mould, MD;  Location: [facility];  Service: Vascular;  Laterality: Right;  . FEMORAL-POPLITEAL BYPASS  GRAFT Left 11/17/2014   Procedure: BYPASS GRAFT FEMORAL-POPLITEAL ARTERY USING GORE PROPATEN 6MM X 80CM VASCULAR GRAFT;  Surgeon: Angelia Mould, MD;  Location: [place];  Service: Vascular;  Laterality: Left;  . HOT HEMOSTASIS N/A 07/10/2016   Procedure: HOT HEMOSTASIS (ARGON PLASMA COAGULATION/BICAP);  Surgeon: Laurence Spates, MD;  Location: [facility] ENDOSCOPY;  Service: Endoscopy;  Laterality: N/A;  . IMPLANTABLE CARDIOVERTER DEFIBRILLATOR (ICD) GENERATOR CHANGE N/A 12/16/2012   Procedure: ICD GENERATOR CHANGE;  Surgeon: Deboraha Sprang, MD;  Location: [facility] CATH LAB;  Service: Cardiovascular;  Laterality: N/A;  . INSERTION OF IMPLANTABLE LEFT VENTRICULAR ASSIST DEVICE N/A 05/30/2016   Procedure: INSERTION OF IMPLANTABLE LEFT VENTRICULAR ASSIST DEVICE;  Surgeon: Ivin Poot, MD;  Location: [facility];  Service: Open Heart Surgery;  Laterality: N/A;  CIRC ARREST  NITRIC OXIDE  . INTRAOPERATIVE ARTERIOGRAM Right 11/07/2013   Procedure: INTRA OPERATIVE ARTERIOGRAM;  Surgeon: Angelia Mould, MD;  Location: [place];  Service: Vascular;  Laterality: Right;  . LOWER EXTREMITY ANGIOGRAM Bilateral 11/06/2013   Procedure: LOWER EXTREMITY ANGIOGRAM;  Surgeon: Angelia Mould, MD;  Location: [facility] CATH LAB;  Service: Cardiovascular;  Laterality: Bilateral;  . Medtronic Virtuoso II DR dual-chamber cardioverter-defibrillation with pocket revison     Dr. Virl Axe  . MULTIPLE EXTRACTIONS WITH ALVEOLOPLASTY N/A 04/26/2016   Procedure: Extraction of tooth #'s 5-14 and 20-30 with alveoloplasty;  Surgeon: Lenn Cal, DDS;  Location: [facility];  Service: Oral Surgery;  Laterality: N/A;  . PERIPHERAL VASCULAR CATHETERIZATION N/A 09/25/2014   Procedure: Abdominal Aortogram;  Surgeon: Elam Dutch, MD;  Location: [place] CV LAB;  Service: Cardiovascular;  Laterality: N/A;  . REMOVAL OF PLEURAL DRAINAGE CATHETER Right 06/13/2016   Procedure: REMOVAL OF PLEURAL DRAINAGE CATHETER;  Surgeon: Ivin Poot,  MD;  Location: [facility];  Service: Thoracic;  Laterality: Right;  . RIGHT HEART CATH N/A 05/01/2016   Procedure: Right Heart Cath;  Surgeon: Larey Dresser, MD;  Location: [place] CV LAB;  Service: Cardiovascular;  Laterality: N/A;  . RIGHT HEART CATH N/A 05/25/2016   Procedure: Right Heart Cath;  Surgeon: Larey Dresser, MD;  Location: [place] [facility] CV LAB;  Service: Cardiovascular;  Laterality: N/A;  . TEE WITHOUT CARDIOVERSION N/A 05/30/2016   Procedure: TRANSESOPHAGEAL ECHOCARDIOGRAM (TEE);  Surgeon: Prescott Gum, Collier Salina, MD;  Location: [facility];  Service: Open Heart Surgery;  Laterality: N/A;  . US GUIDED THORACENTESIS RIGHT ([place] HX) Right 01/05/2016        Home Medications    Prior to Admission medications   Medication Sig Start Date End Date Taking? Authorizing Provider  atorvastatin (LIPITOR) 40 MG tablet Take 1 tablet (40 mg total) by mouth daily. 02/19/17  Yes Larey Dresser, MD  danazol (DANOCRINE) 100 MG capsule Take 1 capsule (100 mg total) by mouth 2 (two) times daily. 03/15/17  Yes Larey Dresser, MD  ezetimibe (ZETIA) 10 MG tablet Take 1 tablet (10 mg total) by mouth daily. 01/08/17  Yes Larey Dresser, MD  Ferrous Sulfate (IRON) 325 (65 Fe) MG TABS Take 1 tablet (325 mg total) by mouth 3 (three) times daily. Patient taking differently: Take 1 each by mouth 2 (two) times daily.  03/15/17  Yes Larey Dresser, MD  insulin NPH-regular Human (NOVOLIN 70/30) (70-30) 100 UNIT/ML injection Inject 12 Units into the skin 3 (three) times daily.    Yes [provider]  losartan (COZAAR) 25 MG tablet Take 1 tablet (25 mg total) by mouth 2 (two) times daily. 01/22/17  Yes Bensimhon, Shaune Pascal, MD  pantoprazole (PROTONIX) 40 MG tablet Take 1 tablet (40 mg total) by mouth 2 (two) times daily. 03/15/17  Yes Larey Dresser, MD  potassium chloride SA (K-DUR,KLOR-CON) 20 MEQ tablet Take 2 tablets (40 mEq total) by mouth 2 (two) times daily. Patient taking differently: Take 40 mEq by mouth  2 (two) times daily. Patient taking one pill twice daily; "difficult to swallow" 08/04/16  Yes Larey Dresser, MD  sildenafil (REVATIO) 20 MG tablet Take 1 tablet (20 mg total) by mouth 3 (three) times daily. Patient taking differently: Take 20 mg by mouth 2 (two) times daily.  05/02/16  Yes Larey Dresser, MD  torsemide (DEMADEX) 20 MG tablet Take 3 tablets (60 mg total) by mouth 2 (two) times daily. 02/19/17  Yes Larey Dresser, MD  warfarin (COUMADIN) 5 MG tablet Take 1 tablet (5 mg total) by mouth daily. 02/19/17  Yes Larey Dresser, MD  blood glucose meter kit and supplies Ultra Blue Kit Dispense based on patient and insurance preference. Use up to  four times daily as directed. (FOR ICD-9 250.00, 250.01). Patient not taking: Reported on 04/11/2017 08/04/16   Larey Dresser, MD    Family History Family History  Problem Relation Age of Onset  . Diabetes Mother   . Coronary artery disease Father   . Heart disease Father        before age 54    Social History Social History   Tobacco Use  . Smoking status: Current Every Day Smoker    Packs/day: 0.50    Years: 45.00    Pack years: 22.50    Types: Cigarettes  . Smokeless tobacco: Never Used  . Tobacco comment: pt reports he is stress smoker  Substance Use Topics  . Alcohol use: No    Alcohol/week: 0.0 oz  . Drug use: No    Comment: former Cocain, Acid, Marijuana - 25 years ago     Allergies   Metformin and related and Niacin and related   Review of Systems Review of Systems  Constitutional: Negative for chills and fever.  HENT: Negative for congestion and facial swelling.   Eyes: Negative for discharge and visual disturbance.  Respiratory: Negative for shortness of breath.   Cardiovascular: Positive for syncope. Negative for chest pain and palpitations.  Gastrointestinal: Negative for abdominal pain, diarrhea and vomiting.  Musculoskeletal: Positive for arthralgias. Negative for myalgias.  Skin: Negative for color  change and rash.  Neurological: Positive for syncope. Negative for tremors and headaches.  Psychiatric/Behavioral: Negative for confusion and dysphoric mood.     Physical Exam Updated Vital Signs BP 103/83   Pulse 92   Resp 14   Ht 5' 11"  (1.803 m)   SpO2 99%   BMI 33.98 kg/m   Physical Exam  Constitutional: He is oriented to person, place, and time. He appears well-developed and well-nourished.  HENT:  Head: Normocephalic and atraumatic.  Eyes: Pupils are equal, round, and reactive to light. EOM are normal.  Neck: Normal range of motion. Neck supple. No JVD present.  Cardiovascular:  Machinery hum  Pulmonary/Chest: No respiratory distress. He has no wheezes.  Abdominal: He exhibits no distension and no mass. There is no tenderness. There is no rebound and no guarding.  Musculoskeletal: Normal range of motion. He exhibits tenderness.  Pain to left paraspinal musculature, no midline tenderness.  Neurological: He is alert and oriented to person, place, and time.  Skin: No rash noted. No pallor.  Psychiatric: He has a normal mood and affect. His behavior is normal.  Nursing note and vitals reviewed.    ED Treatments / Results  Labs (all labs ordered are listed, but only abnormal results are displayed) Labs Reviewed  CBC WITH DIFFERENTIAL/PLATELET - Abnormal; Notable for the following components:      Result Value   RDW 16.4 (*)    Platelets 147 (*)    Neutro Abs 9.2 (*)    All other components within normal limits  BASIC METABOLIC PANEL - Abnormal; Notable for the following components:   Chloride 98 (*)    Glucose, Bld 175 (*)    Creatinine, Ser 1.40 (*)    GFR calc non Af Amer 52 (*)    All other components within normal limits  PROTIME-INR - Abnormal; Notable for the following components:   Prothrombin Time 31.1 (*)    All other components within normal limits  LACTATE DEHYDROGENASE - Abnormal; Notable for the following components:   LDH 248 (*)    All other  components within normal limits  PROTIME-INR    EKG None  Radiology Dg Chest 1 View  Result Date: 04/11/2017 CLINICAL DATA:  Low back pain after fall EXAM: CHEST  1 VIEW COMPARISON:  08/10/2016 FINDINGS: Post sternotomy changes. Left-sided pacing device as before. LVAD similar in position. Cardiomegaly with mild vascular congestion. No acute airspace disease, pleural effusion or pneumothorax. Aortic atherosclerosis. Old right-sided rib fractures. Probable zipper artifact at the thoracic inlet. IMPRESSION: No active disease.  Cardiomegaly with vascular congestion. Electronically Signed   By: Donavan Foil M.D.   On: 04/11/2017 19:23   Dg Lumbar Spine Complete  Result Date: 04/11/2017 CLINICAL DATA:  Low back pain after fall EXAM: LUMBAR SPINE - COMPLETE 4+ VIEW COMPARISON:  CT 04/03/2013, 05/11/2016 FINDINGS: Grade 1 anterolisthesis of L5 on S1 with chronic bilateral pars defect at L5. Age indeterminate mild superior endplate deformity at L1. Remaining vertebral body heights are normal. Aortic atherosclerosis. Moderate degenerative changes at L5-S1. IMPRESSION: 1. Age indeterminate mild superior endplate deformity at L1 2. Grade 1 anterolisthesis of L5 on S1 with chronic bilateral pars defect at L5 Electronically Signed   By: Donavan Foil M.D.   On: 04/11/2017 19:21   Ct Head Wo Contrast  Result Date: 04/11/2017 CLINICAL DATA:  [place] today with altered mental status. Left ventricular assist device patient. EXAM: CT HEAD WITHOUT CONTRAST TECHNIQUE: Contiguous axial images were obtained from the base of the skull through the vertex without intravenous contrast. COMPARISON:  None. FINDINGS: Brain: The brain shows a normal appearance without evidence of malformation, accelerated atrophy, old or acute small or large vessel infarction, mass lesion, hemorrhage, hydrocephalus or extra-axial collection. Vascular: There is atherosclerotic calcification of the major vessels at the base of the brain. Skull:  Normal.  No traumatic finding.  No focal bone lesion. Sinuses/Orbits: Sinuses are clear. Orbits appear normal. Mastoids are clear. Other: None significant IMPRESSION: No acute finding. No cause of the presenting symptoms is identified. Electronically Signed   By: Nelson Chimes M.D.   On: 04/11/2017 19:25    Procedures Procedures (including critical care time)  Medications Ordered in ED Medications  hydrALAZINE (APRESOLINE) injection 10 mg (has no administration in time range)  Warfarin - Pharmacist Dosing Inpatient (has no administration in time range)  acetaminophen (TYLENOL) tablet 1,000 mg (1,000 mg Oral Given 04/11/17 1939)  oxyCODONE (Oxy IR/ROXICODONE) immediate release tablet 5 mg (5 mg Oral Given 04/11/17 1939)  diazepam (VALIUM) tablet 2 mg (2 mg Oral Given 04/11/17 1937)  sodium chloride 0.9 % bolus 1,000 mL (0 mLs Intravenous Stopped 04/11/17 2052)     Initial Impression / Assessment and Plan / ED Course  I have reviewed the triage vital signs and the nursing notes.  Pertinent labs & imaging results that were available during my care of the patient were reviewed by me and considered in my medical decision making (see chart for details).     62 yo M with a cc of a syncopal event.  The patient ended up having pain to his lower back afterwards.  On my exam the pain is to the left lateral region of the back.  No midline tenderness.  Pulse motor and sensation is intact to bilateral lower extremities.  The patient has an LVAD and was evaluated by the LVAD team.  They felt that his syncope was  likely vasovagal in nature and will give fluids and admit to the hospital.   The patients results and plan were reviewed and discussed.   Any x-rays performed were independently reviewed  by myself.   Differential diagnosis were considered with the presenting HPI.  Medications  hydrALAZINE (APRESOLINE) injection 10 mg (has no administration in time range)  Warfarin - Pharmacist Dosing Inpatient  (has no administration in time range)  acetaminophen (TYLENOL) tablet 1,000 mg (1,000 mg Oral Given 04/11/17 1939)  oxyCODONE (Oxy IR/ROXICODONE) immediate release tablet 5 mg (5 mg Oral Given 04/11/17 1939)  diazepam (VALIUM) tablet 2 mg (2 mg Oral Given 04/11/17 1937)  sodium chloride 0.9 % bolus 1,000 mL (0 mLs Intravenous Stopped 04/11/17 2052)    Vitals:   04/11/17 1749 04/11/17 1755 04/11/17 1945 04/11/17 2030  BP:  (!) 130/0 103/83   Pulse:  (!) 44 64 92  Resp:    14  SpO2:  100% 98% 99%  Height: 5' 11"  (1.803 m)       Final diagnoses:  Syncope and collapse  Acute left-sided low back pain without sciatica    Admission/ observation were discussed with the admitting physician, patient and/or family and they are comfortable with the plan.    Final Clinical Impressions(s) / ED Diagnoses   Final diagnoses:  Syncope and collapse  Acute left-sided low back pain without sciatica    ED Discharge Orders    None       Deno Etienne, DO 04/11/17 2121

## 2017-04-11 NOTE — H&P (Addendum)
Advanced Heart Failure VAD History and Physical Note   HF cardiologist: [name]  Reason for Admission: Syncope/fall  HPI:    [name] is a 62 y.o. smoker with history of PAD, CAD, and ischemic cardiomyopathy s/p HVAD placement 05/30/16 presents to ER with recurrent syncope  He was admitted in 7/18 with upper GI bleed.  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  He had 3 units PRBCs.  ASA was decreased from 325 to 81 daily and warfarin goal was decreased to 2-2.5.  Octreotide was started but he developed profuse diarrhea that seems to have been related to octreotide.  This was stopped and he was started on danazol.    Had a syncopal episode several months ago after taking too much torsemide.  Several weeks ago had another syncopal episode while he was having dry heaves. [place] and hurt his left arm.   Says he has been doing ok recently. Today was having another one of his dry heave episodes. Went to bathroom to urinate as well. When he turned around to go back to the bedroom his legs gave out and he lost consciousness and fell to floor. Denies hitting head. Unable to get up due to weakness and severe low back pain. Called EMS who transported him to ER.   Denies recent orthostatic symptoms. Has not taken extra torsemide. No bleeding or melena. No fevers or chills.   ICD interrogation no VT/VF (reviewed personally)   MAP 103  Spine films: 1. Age indeterminate mild superior endplate deformity at L1 2. Grade 1 anterolisthesis of L5 on S1 with chronic bilateral pars defect at L5  CT head: no bleed   LVAD INTERROGATION:  HeartMate II LVAD:  Flow 4.6 liters/min, speed 2840, power 5.5W, Peak 7.0 Trough 3.1  Numerous suction events and low flow events   Review of Systems: [y] = yes, [ ]  = no   General: Weight gain [ ] ; Weight loss [ ] ; Anorexia [ ] ; Fatigue [ y]; Fever [ ] ; Chills [ ] ; Weakness Blue.Reese ]  Cardiac: Chest pain/pressure [ ] ; Resting SOB [ ] ; Exertional SOB [  ]; Orthopnea [ ] ; Pedal Edema Blue.Reese ]; Palpitations [ ] ; Syncope [ ] ; Presyncope [ ] ; Paroxysmal nocturnal dyspnea[ ]   Pulmonary: Cough [ ] ; Wheezing[ ] ; Hemoptysis[ ] ; Sputum [ ] ; Snoring [ ]   GI: Vomiting[ y]; Dysphagia[ ] ; Melena[ ] ; Hematochezia [ ] ; Heartburn[ ] ; Abdominal pain [ ] ; Constipation [ ] ; Diarrhea [ ] ; BRBPR [ ]   GU: Hematuria[ ] ; Dysuria [ ] ; Nocturia[ ]   Vascular: Pain in legs with walking [ ] ; Pain in feet with lying flat [ ] ; Non-healing sores [ ] ; Stroke [ ] ; TIA [ ] ; Slurred speech [ ] ;  Neuro: Headaches[ ] ; Vertigo[ ] ; Seizures[ ] ; Paresthesias[ ] ;Blurred vision [ ] ; Diplopia [ ] ; Vision changes [ ]   Ortho/Skin: Arthritis Blue.Reese ]; Joint pain Blue.Reese ]; Muscle pain [ ] ; Joint swelling [ ] ; Back Pain Blue.Reese ]; Rash [ ]   Psych: Depression[ ] ; Anxiety[ ]   Heme: Bleeding problems [ ] ; Clotting disorders [ ] ; Anemia [ ]   Endocrine: Diabetes [ ] ; Thyroid dysfunction[ ]     Home Medications Prior to Admission medications   Medication Sig Start Date End Date Taking? Authorizing Provider  atorvastatin (LIPITOR) 40 MG tablet Take 1 tablet (40 mg total) by mouth daily. 02/19/17  Yes Larey Dresser, MD  danazol (DANOCRINE) 100 MG capsule Take 1 capsule (100 mg total) by mouth 2 (two) times daily. 03/15/17  Yes Larey Dresser, MD  ezetimibe (ZETIA) 10 MG tablet Take 1 tablet (10 mg total) by mouth daily. 01/08/17  Yes Larey Dresser, MD  Ferrous Sulfate (IRON) 325 (65 Fe) MG TABS Take 1 tablet (325 mg total) by mouth 3 (three) times daily. Patient taking differently: Take 1 each by mouth 2 (two) times daily.  03/15/17  Yes Larey Dresser, MD  insulin NPH-regular Human (NOVOLIN 70/30) (70-30) 100 UNIT/ML injection Inject 12 Units into the skin 3 (three) times daily.    Yes [provider]  losartan (COZAAR) 25 MG tablet Take 1 tablet (25 mg total) by mouth 2 (two) times daily. 01/22/17  Yes Braelen Sproule, Shaune Pascal, MD  pantoprazole (PROTONIX) 40 MG tablet Take 1 tablet (40 mg total) by mouth 2  (two) times daily. 03/15/17  Yes Larey Dresser, MD  potassium chloride SA (K-DUR,KLOR-CON) 20 MEQ tablet Take 2 tablets (40 mEq total) by mouth 2 (two) times daily. Patient taking differently: Take 40 mEq by mouth 2 (two) times daily. Patient taking one pill twice daily; "difficult to swallow" 08/04/16  Yes Larey Dresser, MD  sildenafil (REVATIO) 20 MG tablet Take 1 tablet (20 mg total) by mouth 3 (three) times daily. Patient taking differently: Take 20 mg by mouth 2 (two) times daily.  05/02/16  Yes Larey Dresser, MD  torsemide (DEMADEX) 20 MG tablet Take 3 tablets (60 mg total) by mouth 2 (two) times daily. 02/19/17  Yes Larey Dresser, MD  warfarin (COUMADIN) 5 MG tablet Take 1 tablet (5 mg total) by mouth daily. 02/19/17  Yes Larey Dresser, MD  blood glucose meter kit and supplies Ultra Blue Kit Dispense based on patient and insurance preference. Use up to four times daily as directed. (FOR ICD-9 250.00, 250.01). Patient not taking: Reported on 04/11/2017 08/04/16   Larey Dresser, MD    Past Medical History: Past Medical History:  Diagnosis Date  . AICD (automatic cardioverter/defibrillator) present   . Arthritis   . Barretts syndrome   . Carotid artery disease ([place])    a. Dopp 09/2011: 50-03% RICA, 70-48% LICA.;  b.  Carotid US (7/14):  Bilateral 60-79% => f/u 6 mos  . Cerebrovascular disease   . Chronic systolic heart failure ([place])   . Coronary artery disease    a. AWMI requiring IABP 2005 s/p Horizon study stent-LAD, staged BMS-Cx, DESx2-RCA. b. Last Last LHC (6/06):  EF 25%, pLAD stent 40-50, after stent 40, dLAD 20, pCFX 20, pOM1 70, pRCA 20, mRCA stents ok.=> med Rx;  c.  Myoview (7/14):  EF 26%, large ant, septal, inf and apical infarct, no ischemia.  Med Rx continued  . Diabetes mellitus   . Dyspnea    with exertion  . Headache   . Heart murmur   . History of kidney stones   . HTN (hypertension)   . Hyperlipidemia    mixed  . ICD (implantable cardiac defibrillator),  dual, in situ    St. Jude for severe LVD EF 25% 2/07 explanted 2010. Medtronic Virtuoso II DR Dual-chamber cardiverter-defibrillator  with pocket revision, Dr. Caryl Comes  . Ischemic cardiomyopathy    a. Echo (09/27/12): EF 20%, diffuse HK with periapical AK, no LV thrombus noted, restrictive physiology, trivial MR, mild LAE, RVSF mildly reduced, PASP 39  . Pleural effusion 01/05/2016  . Presence of permanent cardiac pacemaker   . PVD (peripheral vascular disease) ([facility])    a. ABI 09/2011 - R normal, L moderate - saw  Dr. Fletcher Anon - med rx.   Marland Kitchen RIATA ICD Lead--on advisory recall    . Tobacco abuse     Past Surgical History: Past Surgical History:  Procedure Laterality Date  . ABDOMINAL AORTAGRAM N/A 11/06/2013   Procedure: ABDOMINAL Maxcine Ham;  Surgeon: Angelia Mould, MD;  Location: [facility] CATH LAB;  Service: Cardiovascular;  Laterality: N/A;  . APPENDECTOMY  2000   ruptured  . CARDIAC CATHETERIZATION N/A 01/07/2016   Procedure: Right/Left Heart Cath and Coronary Angiography;  Surgeon: Larey Dresser, MD;  Location: [place] CV LAB;  Service: Cardiovascular;  Laterality: N/A;  . CARDIAC CATHETERIZATION N/A 01/28/2016   Procedure: Coronary Stent Intervention;  Surgeon: Sherren Mocha, MD;  Location: [place] CV LAB;  Service: Cardiovascular;  Laterality: N/A;  . CARDIAC CATHETERIZATION N/A 01/28/2016   Procedure: Right Heart Cath;  Surgeon: Sherren Mocha, MD;  Location: [place] CV LAB;  Service: Cardiovascular;  Laterality: N/A;  . CARDIAC CATHETERIZATION N/A 01/28/2016   Procedure: Intravascular Ultrasound/IVUS;  Surgeon: Sherren Mocha, MD;  Location: [place] CV LAB;  Service: Cardiovascular;  Laterality: N/A;  . CHEST TUBE INSERTION Right 04/07/2016   Procedure: INSERTION PLEURAL DRAINAGE CATHETER;  Surgeon: Ivin Poot, MD;  Location: [place];  Service: Thoracic;  Laterality: Right;  . ENTEROSCOPY N/A 07/10/2016   Procedure: ENTEROSCOPY;  Surgeon: Laurence Spates, MD;  Location: [facility]  ENDOSCOPY;  Service: Endoscopy;  Laterality: N/A;  . FEMORAL-POPLITEAL BYPASS GRAFT Right 11/07/2013   Procedure: Right FEMORAL-POPLITEAL ARTERY Bypass ;  Surgeon: Angelia Mould, MD;  Location: [place];  Service: Vascular;  Laterality: Right;  . FEMORAL-POPLITEAL BYPASS GRAFT Left 11/17/2014   Procedure: BYPASS GRAFT FEMORAL-POPLITEAL ARTERY USING GORE PROPATEN 6MM X 80CM VASCULAR GRAFT;  Surgeon: Angelia Mould, MD;  Location: [place];  Service: Vascular;  Laterality: Left;  . HOT HEMOSTASIS N/A 07/10/2016   Procedure: HOT HEMOSTASIS (ARGON PLASMA COAGULATION/BICAP);  Surgeon: Laurence Spates, MD;  Location[facility] ENDOSCOPY;  Service: Endoscopy;  Laterality: N/A;  . IMPLANTABLE CARDIOVERTER DEFIBRILLATOR (ICD) GENERATOR CHANGE N/A 12/16/2012   Procedure: ICD GENERATOR CHANGE;  Surgeon: Deboraha Sprang, MD;  Location: [facility] CATH LAB;  Service: Cardiovascular;  Laterality: N/A;  . INSERTION OF IMPLANTABLE LEFT VENTRICULAR ASSIST DEVICE N/A 05/30/2016   Procedure: INSERTION OF IMPLANTABLE LEFT VENTRICULAR ASSIST DEVICE;  Surgeon: Ivin Poot, MD;  Location: [place];  Service: Open Heart Surgery;  Laterality: N/A;  CIRC ARREST  NITRIC OXIDE  . INTRAOPERATIVE ARTERIOGRAM Right 11/07/2013   Procedure: INTRA OPERATIVE ARTERIOGRAM;  Surgeon: Angelia Mould, MD;  Location: [place];  Service: Vascular;  Laterality: Right;  . LOWER EXTREMITY ANGIOGRAM Bilateral 11/06/2013   Procedure: LOWER EXTREMITY ANGIOGRAM;  Surgeon: Angelia Mould, MD;  Location: [facility] CATH LAB;  Service: Cardiovascular;  Laterality: Bilateral;  . Medtronic Virtuoso II DR dual-chamber cardioverter-defibrillation with pocket revison     Dr. Virl Axe  . MULTIPLE EXTRACTIONS WITH ALVEOLOPLASTY N/A 04/26/2016   Procedure: Extraction of tooth #'s 5-14 and 20-30 with alveoloplasty;  Surgeon: Lenn Cal, DDS;  Location: [place];  Service: Oral Surgery;  Laterality: N/A;  . PERIPHERAL VASCULAR CATHETERIZATION N/A 09/25/2014     Procedure: Abdominal Aortogram;  Surgeon: Elam Dutch, MD;  Location: [place] CV LAB;  Service: Cardiovascular;  Laterality: N/A;  . REMOVAL OF PLEURAL DRAINAGE CATHETER Right 06/13/2016   Procedure: REMOVAL OF PLEURAL DRAINAGE CATHETER;  Surgeon: Ivin Poot, MD;  Location: [place];  Service: Thoracic;  Laterality: Right;  . RIGHT  HEART CATH N/A 05/01/2016   Procedure: Right Heart Cath;  Surgeon: Larey Dresser, MD;  Location: [place] CV LAB;  Service: Cardiovascular;  Laterality: N/A;  . RIGHT HEART CATH N/A 05/25/2016   Procedure: Right Heart Cath;  Surgeon: Larey Dresser, MD;  Location: [place] CV LAB;  Service: Cardiovascular;  Laterality: N/A;  . TEE WITHOUT CARDIOVERSION N/A 05/30/2016   Procedure: TRANSESOPHAGEAL ECHOCARDIOGRAM (TEE);  Surgeon: Prescott Gum, Collier Salina, MD;  Location: [place];  Service: Open Heart Surgery;  Laterality: N/A;  . US GUIDED THORACENTESIS RIGHT ([place] HX) Right 01/05/2016    Family History: Family History  Problem Relation Age of Onset  . Diabetes Mother   . Coronary artery disease Father   . Heart disease Father        before age 101    Social History: Social History   Socioeconomic History  . Marital status: Divorced    Spouse name: Not on file  . Number of children: 2  . Years of education: Not on file  . Highest education level: Not on file  Occupational History  . Occupation: Radiation protection practitioner: [place] [employer]: Full time  Social Needs  . Financial resource strain: Not on file  . Food insecurity:    Worry: Not on file    Inability: Not on file  . Transportation needs:    Medical: Not on file    Non-medical: Not on file  Tobacco Use  . Smoking status: Current Every Day Smoker    Packs/day: 0.50    Years: 45.00    Pack years: 22.50    Types: Cigarettes  . Smokeless tobacco: Never Used  . Tobacco comment: pt reports he is stress smoker  Substance and Sexual Activity  . Alcohol use: No     Alcohol/week: 0.0 oz  . Drug use: No    Comment: former Cocain, Acid, Marijuana - 25 years ago  . Sexual activity: Never  Lifestyle  . Physical activity:    Days per week: Not on file    Minutes per session: Not on file  . Stress: Not on file  Relationships  . Social connections:    Talks on phone: Not on file    Gets together: Not on file    Attends religious service: Not on file    Active member of club or organization: Not on file    Attends meetings of clubs or organizations: Not on file    Relationship status: Not on file  Other Topics Concern  . Not on file  Social History Narrative   Divorced. 2 children.   Previously worked at [employer].    Allergies:  Allergies  Allergen Reactions  . Metformin And Related Nausea And Vomiting    *Only the extended release* (does this)  . Niacin And Related Other (See Comments)    REACTION IS SIDE EFFECT "SEVERE" FLUSHING    Objective:    Vital Signs:   Pulse Rate:  [44] 44 (04/10 1755) BP: (130)/(0) 130/0 (04/10 1755) SpO2:  [98 %-100 %] 100 % (04/10 1755)   There were no vitals filed for this visit.  Mean arterial Pressure 100  Physical Exam    General:  Lying flay in bed. Uncomfortable with back pain HEENT: Normal Neck: supple. JVP 9-10 . Carotids 2+ bilat; no bruits. No lymphadenopathy or thyromegaly appreciated. Cor: Mechanical heart sounds with LVAD hum present. Lungs: Clear  Abdomen: soft, nontender, nondistended. No hepatosplenomegaly.  No bruits or masses. Good bowel sounds. Driveline: C/D/I; securement device intact and driveline incorporated Extremities: no cyanosis, clubbing, rash, edema Neuro: alert & orientedx3, cranial nerves grossly intact. moves all 4 extremities able to bend knees. LE sensation and strength intact.   Telemetry   NSR 90s with frequents PVCs .dbrp   EKG   Pending  Labs    Basic Metabolic Panel: No results for input(s): NA, K, CL, CO2, GLUCOSE, BUN, CREATININE, CALCIUM, MG,  PHOS in the last 168 hours.  Liver Function Tests: No results for input(s): AST, ALT, ALKPHOS, BILITOT, PROT, ALBUMIN in the last 168 hours. No results for input(s): LIPASE, AMYLASE in the last 168 hours. No results for input(s): AMMONIA in the last 168 hours.  CBC: No results for input(s): WBC, NEUTROABS, HGB, HCT, MCV, PLT in the last 168 hours.  Cardiac Enzymes: No results for input(s): CKTOTAL, CKMB, CKMBINDEX, TROPONINI in the last 168 hours.  BNP: BNP (last 3 results) Recent Labs    05/24/16 1120 05/31/16 0228 06/06/16 0016  BNP 374.5* 270.0* 398.6*    ProBNP (last 3 results) No results for input(s): PROBNP in the last 8760 hours.   CBG: No results for input(s): GLUCAP in the last 168 hours.  Coagulation Studies: No results for input(s): LABPROT, INR in the last 72 hours.    Imaging     No results found.    Patient Profile:   62 y.o. smoker with history of PAD, CAD, and ischemic cardiomyopathy now s/p HVAD. Admitted with a fall.      Assessment/Plan:    1. Syncope with fall and back pain - he is in quite a bit of pain. Back films viewed personally and reviewed with Radiology. Possible acute endplate fracture at L1. Unable to do MRI with VAD. Will likely need CT.  - d/w ER provider. Will get lumbo-sacral CT - will need pain control. - VAD interrogation (done personally) suggest multiple low flow events and suctions alarms. Suspect vagal episode in setting of dry heaves and urinating likely complicated by low volume. MAP currently ok. No VT/VF on ICD.  - VAD speed turned down 2800. Will give 1L NS.  - Will get ramp echo  2. Chronic systolic CHF: Ischemic cardiomyopathy s/p VAD.  Echo in 3/18 with EF 20-25%. St Jude ICD.  He is now s/p HVAD placement.  Lavare cycle is on. Felto have degree of RV failure and will not tolerate CVP down to normal range. - Hold diuretics and anti-HTN meds for now - Check labs including CBC, INR and LDH  - Ramp echo in  am - He is going to think about whether he would eventually want to have transplant evaluation. Will have to stop smoking.   3. HTN - BP up in setting of pain. - Will follow closely - Use hydralazine prn for MAP > 100 34. CAD: 3 vessel CAD, s/p stent to proximal LAD and PTCA to subtotally occluded large OM1 in 1/18.  - No s/s ischemia - He was on Plavix for > 3 months post-intervention, now on warfarin.  ASA stopped with bleeding.  - Continue atorvastatin 80 mg daily.   5. PAD: Stable claudication, notes with moderate walking on left.  Left fem-pop bypass occluded.  No pedal ulcerations.  Medical management per Dr. Scot Dock.  6. Carotid stenosis:  Followed at [facility].  7. R Pleural effusion: PleurX catheter in past.  8. Pulmonary arterial HTN: Continue Revatio 20 mg tidfor now 9. Smoking: Will need to stop  smoking if he is going to be a transplant candidate 10. 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  ASA was decreased to 81 daily.  Warfarin INR goal is now 2-2.5.  He started monthly octreotide injections but had profuse diarrhea with octreotide after 1st injection and after 2nd injection with decreased dose.  He is tolerating danazol without problems. 12/18 had more melena in setting of high INR => due to vigorous epistaxis versus AVM bleeding. ASA was stopped.    - He stay off ASA 81 and will continue INR goal 2-2.5 for now.   - Continue danazol.  11. Anti-coag management  - as above   I reviewed the LVAD parameters from today, and compared the results to the patient's prior recorded data.  No programming changes were made.  The LVAD is functioning within specified parameters.  The patient performs LVAD self-test daily.  LVAD interrogation was negative for any significant power changes, alarms or PI events/speed drops.  LVAD equipment check completed and is in good working order.  Back-up equipment present.   LVAD education done on emergency procedures and precautions and reviewed exit  site care.  Length of Stay: 0  Glori Bickers, MD 04/11/2017, 7:04 PM  VAD Team Pager [phone] (7am - 7am) +++VAD ISSUES ONLY+++ Advanced Heart Failure Team Pager [phone] (M-F; [place])  Please contact [facility] for night-coverage after hours (4p -7a ) and weekends on [email or web address] for all non- LVAD Issues

## 2017-04-11 NOTE — ED Notes (Signed)
Pt to CT and XR with LVAD RN

## 2017-04-12 ENCOUNTER — Inpatient Hospital Stay (HOSPITAL_COMMUNITY): Payer: BLUE CROSS/BLUE SHIELD

## 2017-04-12 ENCOUNTER — Other Ambulatory Visit: Payer: Self-pay

## 2017-04-12 ENCOUNTER — Encounter (HOSPITAL_COMMUNITY): Payer: Self-pay | Admitting: *Deleted

## 2017-04-12 DIAGNOSIS — R55 Syncope and collapse: Secondary | ICD-10-CM

## 2017-04-12 DIAGNOSIS — I5022 Chronic systolic (congestive) heart failure: Secondary | ICD-10-CM

## 2017-04-12 LAB — GLUCOSE, CAPILLARY
GLUCOSE-CAPILLARY: 262 mg/dL — AB (ref 65–99)
GLUCOSE-CAPILLARY: 216 mg/dL — AB (ref 65–99)
Glucose-Capillary: 132 mg/dL — ABNORMAL HIGH (ref 65–99)
Glucose-Capillary: 145 mg/dL — ABNORMAL HIGH (ref 65–99)
Glucose-Capillary: 231 mg/dL — ABNORMAL HIGH (ref 65–99)

## 2017-04-12 LAB — CBC
HEMATOCRIT: 42.1 % (ref 39.0–52.0)
HEMOGLOBIN: 13.6 g/dL (ref 13.0–17.0)
MCH: 27.4 pg (ref 26.0–34.0)
MCHC: 32.3 g/dL (ref 30.0–36.0)
MCV: 84.9 fL (ref 78.0–100.0)
Platelets: 150 10*3/uL (ref 150–400)
RBC: 4.96 MIL/uL (ref 4.22–5.81)
RDW: 16.6 % — AB (ref 11.5–15.5)
WBC: 9.5 10*3/uL (ref 4.0–10.5)

## 2017-04-12 LAB — BASIC METABOLIC PANEL
Anion gap: 12 (ref 5–15)
BUN: 13 mg/dL (ref 6–20)
CALCIUM: 8.8 mg/dL — AB (ref 8.9–10.3)
CHLORIDE: 99 mmol/L — AB (ref 101–111)
CO2: 27 mmol/L (ref 22–32)
Creatinine, Ser: 1.35 mg/dL — ABNORMAL HIGH (ref 0.61–1.24)
GFR calc Af Amer: >60 mL/min (ref >60)
GFR calc non Af Amer: 55 mL/min — ABNORMAL LOW (ref >60)
Glucose, Bld: 168 mg/dL — ABNORMAL HIGH (ref 65–99)
Potassium: 3.9 mmol/L (ref 3.5–5.1)
SODIUM: 138 mmol/L (ref 135–145)

## 2017-04-12 LAB — MRSA PCR SCREENING: MRSA BY PCR: NEGATIVE

## 2017-04-12 LAB — ECHOCARDIOGRAM COMPLETE
HEIGHTINCHES: 72 in
Weight: 3714.31 oz

## 2017-04-12 LAB — LACTATE DEHYDROGENASE: LDH: 217 U/L — AB (ref 98–192)

## 2017-04-12 LAB — PROTIME-INR
INR: 3.02
PROTHROMBIN TIME: 31 s — AB (ref 11.4–15.2)

## 2017-04-12 LAB — HIV ANTIBODY (ROUTINE TESTING W REFLEX): HIV Screen 4th Generation wRfx: NONREACTIVE

## 2017-04-12 MED ORDER — FERROUS SULFATE 325 (65 FE) MG PO TABS
325.0000 mg | ORAL_TABLET | Freq: Two times a day (BID) | ORAL | Status: DC
  Administered 2017-04-12 – 2017-04-16 (×9): 325 mg via ORAL
  Filled 2017-04-12 (×9): qty 1

## 2017-04-12 MED ORDER — ATORVASTATIN CALCIUM 40 MG PO TABS
40.0000 mg | ORAL_TABLET | Freq: Every day | ORAL | Status: DC
  Administered 2017-04-12: 40 mg via ORAL
  Filled 2017-04-12: qty 1

## 2017-04-12 MED ORDER — ACETAMINOPHEN 325 MG PO TABS: 650.0000 mg | ORAL_TABLET | ORAL | Status: DC | PRN

## 2017-04-12 MED ORDER — ATORVASTATIN CALCIUM 80 MG PO TABS
80.0000 mg | ORAL_TABLET | Freq: Every day | ORAL | Status: DC
  Administered 2017-04-13 – 2017-04-16 (×4): 80 mg via ORAL
  Filled 2017-04-12 (×4): qty 1

## 2017-04-12 MED ORDER — LOSARTAN POTASSIUM 25 MG PO TABS
25.0000 mg | ORAL_TABLET | Freq: Two times a day (BID) | ORAL | Status: DC
  Administered 2017-04-12: 25 mg via ORAL
  Filled 2017-04-12: qty 1

## 2017-04-12 MED ORDER — PANTOPRAZOLE SODIUM 40 MG PO TBEC
40.0000 mg | DELAYED_RELEASE_TABLET | Freq: Two times a day (BID) | ORAL | Status: DC
  Administered 2017-04-12 – 2017-04-16 (×9): 40 mg via ORAL
  Filled 2017-04-12 (×9): qty 1

## 2017-04-12 MED ORDER — FENTANYL CITRATE (PF) 100 MCG/2ML IJ SOLN
50.0000 ug | INTRAMUSCULAR | Status: DC | PRN
Start: 2017-04-12 — End: 2017-04-16
  Administered 2017-04-13 (×2): 50 ug via INTRAVENOUS
  Filled 2017-04-12 (×2): qty 2

## 2017-04-12 MED ORDER — ONDANSETRON HCL 4 MG/2ML IJ SOLN
4.0000 mg | Freq: Four times a day (QID) | INTRAMUSCULAR | Status: DC | PRN
  Administered 2017-04-12 – 2017-04-15 (×3): 4 mg via INTRAVENOUS
  Filled 2017-04-12 (×3): qty 2

## 2017-04-12 MED ORDER — INSULIN ASPART PROT & ASPART (70-30 MIX) 100 UNIT/ML ~~LOC~~ SUSP
12.0000 [IU] | Freq: Two times a day (BID) | SUBCUTANEOUS | Status: DC
Start: 2017-04-12 — End: 2017-04-16
  Administered 2017-04-12 – 2017-04-16 (×9): 12 [IU] via SUBCUTANEOUS
  Filled 2017-04-12: qty 10

## 2017-04-12 MED ORDER — EZETIMIBE 10 MG PO TABS
10.0000 mg | ORAL_TABLET | Freq: Every day | ORAL | Status: DC
  Administered 2017-04-12 – 2017-04-16 (×5): 10 mg via ORAL
  Filled 2017-04-12 (×5): qty 1

## 2017-04-12 MED ORDER — OXYCODONE-ACETAMINOPHEN 5-325 MG PO TABS
1.0000 | ORAL_TABLET | ORAL | Status: DC | PRN
  Administered 2017-04-12 (×4): 2 via ORAL
  Administered 2017-04-15: 1 via ORAL
  Administered 2017-04-16: 2 via ORAL
  Filled 2017-04-12 (×2): qty 2
  Filled 2017-04-12: qty 1
  Filled 2017-04-12 (×3): qty 2

## 2017-04-12 MED ORDER — DANAZOL 100 MG PO CAPS
100.0000 mg | ORAL_CAPSULE | Freq: Two times a day (BID) | ORAL | Status: DC
  Filled 2017-04-12: qty 1

## 2017-04-12 MED ORDER — INSULIN ASPART 100 UNIT/ML ~~LOC~~ SOLN
0.0000 [IU] | Freq: Three times a day (TID) | SUBCUTANEOUS | Status: DC
  Administered 2017-04-12: 5 [IU] via SUBCUTANEOUS
  Administered 2017-04-13: 2 [IU] via SUBCUTANEOUS
  Administered 2017-04-14 (×2): 3 [IU] via SUBCUTANEOUS
  Administered 2017-04-15: 2 [IU] via SUBCUTANEOUS
  Administered 2017-04-15: 3 [IU] via SUBCUTANEOUS

## 2017-04-12 MED ORDER — SILDENAFIL CITRATE 20 MG PO TABS
20.0000 mg | ORAL_TABLET | Freq: Two times a day (BID) | ORAL | Status: DC
  Administered 2017-04-12 – 2017-04-16 (×10): 20 mg via ORAL
  Filled 2017-04-12 (×10): qty 1

## 2017-04-12 MED ORDER — SUCRALFATE 1 G PO TABS
1.0000 g | ORAL_TABLET | Freq: Two times a day (BID) | ORAL | Status: DC
  Administered 2017-04-12 – 2017-04-13 (×3): 1 g via ORAL
  Filled 2017-04-12 (×3): qty 1

## 2017-04-12 MED ORDER — LOSARTAN POTASSIUM 25 MG PO TABS: 25.0000 mg | ORAL_TABLET | Freq: Two times a day (BID) | ORAL | Status: DC

## 2017-04-12 MED ORDER — INSULIN ASPART 100 UNIT/ML ~~LOC~~ SOLN: 0.0000 [IU] | Freq: Every day | SUBCUTANEOUS | Status: DC

## 2017-04-12 NOTE — Progress Notes (Signed)
Orthopedic Tech Progress Note Patient Details:  Dalton RainwaterLarry D [name] 04-27-1955 [id number]  Patient ID: [name], male   DOB: 04-27-1955, 62 y.o.   MRN: [id number]   Saul FordyceJennifer C Desaree Downen 04/12/2017, 9:18 AMCalled [employer] for Lumbar corset.

## 2017-04-12 NOTE — Progress Notes (Signed)
Speed  Flow  Peak/Trough Power  LVIDD  AI  Aortic openings  MR  TR  Septum  Tapse RV   2800 3.9 5.6/  2.5 4.4 5.3 mild 5/5 trace mild Bowing to left .58 mild   2760  3.8 5.8/ 2.6 4.8 5.6 trace 5/5 trace trace Bowing to left  mild  2700  3.8 5.6/2.3 4.4 6.1 trace 5/5     mild  2640  3.5 5.5/1.9 4.2 5.3  5/5                                    Doppler MAP: 96/81 (88)   Ramp ECHO performed at bedside per Dr. Shirlee LatchMcLean.  At completion of ramp study, patients primary and back up controller programmed:  Fixed speed: 2700 Alarms: Flow 1.5 L/min             6.5 High power alarm             44 HCT    Hessie DienerMolly Haedyn Ancrum RN, VAD Coordinator 24/7 pager [phone]

## 2017-04-12 NOTE — Progress Notes (Signed)
  Echocardiogram 2D Echocardiogram has been performed.  Dalton SkeenVijay  Meyer Arora 04/12/2017, 5:02 PM

## 2017-04-12 NOTE — Progress Notes (Signed)
Patient ID: Lynda RainwaterLarry D Viera, male   DOB: [birth year], 62 y.o.   MRN: [id number]   Advanced Heart Failure VAD Team Note  PCP-Cardiologist: Shirlee LatchMcLean  Subjective:    Stable overnight, no suction alarms or low flow.  MAP 90s.  He got 1 L IV fluid, torsemide held.  Still with significant low back pain.  No further nausea/dry heaves.   CT spine: L1 burst fracture  LVAD INTERROGATION:  Heartware Flow 4.1 liters/min (peak 6.2/trough 2.8), speed 2800 rpm, power 5.0.   No further alarms  Objective:    Vital Signs:   Temp:  [98.4 F (36.9 C)-98.8 F (37.1 C)] 98.4 F (36.9 C) (04/11 0430) Pulse Rate:  [44-93] 84 (04/11 0430) Resp:  [13-22] 13 (04/11 0430) BP: (103-130)/(0-92) 104/92 (04/11 0430) SpO2:  [98 %-100 %] 100 % (04/11 0430) Weight:  [232 lb 2.3 oz (105.3 kg)-233 lb 0.4 oz (105.7 kg)] 232 lb 2.3 oz (105.3 kg) (04/11 0618) Last BM Date: 04/11/17 Mean arterial Pressure 90s  Intake/Output:   Intake/Output Summary (Last 24 hours) at 04/12/2017 0752 Last data filed at 04/12/2017 0458 Gross per 24 hour  Intake -  Output 300 ml  Net -300 ml     Physical Exam    General:  Mildly uncomfortable lying on back.  HEENT: normal Neck: supple. JVP 8. Carotids 2+ bilat; no bruits. No lymphadenopathy or thyromegaly appreciated. Cor: Mechanical heart sounds with LVAD hum present. Lungs: clear Abdomen: soft, nontender, nondistended. No hepatosplenomegaly. No bruits or masses. Good bowel sounds. Driveline: C/D/I; securement device intact and driveline incorporated Extremities: no cyanosis, clubbing, rash, trace ankle edema Neuro: alert & orientedx3, cranial nerves grossly intact. moves all 4 extremities w/o difficulty. Affect pleasant   Telemetry   NSR with PVCs (personally reviewed)  Labs   Basic Metabolic Panel: Recent Labs  Lab 04/11/17 1941 04/12/17 0234  NA 137 138  K 4.0 3.9  CL 98* 99*  CO2 24 27  GLUCOSE 175* 168*  BUN 14 13  CREATININE 1.40* 1.35*  CALCIUM 8.9 8.8*     Liver Function Tests: No results for input(s): AST, ALT, ALKPHOS, BILITOT, PROT, ALBUMIN in the last 168 hours. No results for input(s): LIPASE, AMYLASE in the last 168 hours. No results for input(s): AMMONIA in the last 168 hours.  CBC: Recent Labs  Lab 04/11/17 1941 04/12/17 0234  WBC 10.2 9.5  NEUTROABS 9.2*  --   HGB 14.3 13.6  HCT 43.3 42.1  MCV 83.8 84.9  PLT 147* 150    INR: Recent Labs  Lab 04/05/17 1036 04/11/17 1941 04/12/17 0234  INR 2.85 3.02 3.02    Other results:  EKG:    Imaging   Dg Chest 1 View  Result Date: 04/11/2017 CLINICAL DATA:  Low back pain after fall EXAM: CHEST  1 VIEW COMPARISON:  08/10/2016 FINDINGS: Post sternotomy changes. Left-sided pacing device as before. LVAD similar in position. Cardiomegaly with mild vascular congestion. No acute airspace disease, pleural effusion or pneumothorax. Aortic atherosclerosis. Old right-sided rib fractures. Probable zipper artifact at the thoracic inlet. IMPRESSION: No active disease.  Cardiomegaly with vascular congestion. Electronically Signed   By: Jasmine PangKim  Fujinaga M.D.   On: 04/11/2017 19:23   Dg Lumbar Spine Complete  Result Date: 04/11/2017 CLINICAL DATA:  Low back pain after fall EXAM: LUMBAR SPINE - COMPLETE 4+ VIEW COMPARISON:  CT 04/03/2013, 05/11/2016 FINDINGS: Grade 1 anterolisthesis of L5 on S1 with chronic bilateral pars defect at L5. Age indeterminate mild superior endplate deformity at L1.  Remaining vertebral body heights are normal. Aortic atherosclerosis. Moderate degenerative changes at L5-S1. IMPRESSION: 1. Age indeterminate mild superior endplate deformity at L1 2. Grade 1 anterolisthesis of L5 on S1 with chronic bilateral pars defect at L5 Electronically Signed   By: Jasmine Pang M.D.   On: 04/11/2017 19:21   Ct Head Wo Contrast  Result Date: 04/11/2017 CLINICAL DATA:  Larey Seat today with altered mental status. Left ventricular assist device patient. EXAM: CT HEAD WITHOUT CONTRAST  TECHNIQUE: Contiguous axial images were obtained from the base of the skull through the vertex without intravenous contrast. COMPARISON:  None. FINDINGS: Brain: The brain shows a normal appearance without evidence of malformation, accelerated atrophy, old or acute small or large vessel infarction, mass lesion, hemorrhage, hydrocephalus or extra-axial collection. Vascular: There is atherosclerotic calcification of the major vessels at the base of the brain. Skull: Normal.  No traumatic finding.  No focal bone lesion. Sinuses/Orbits: Sinuses are clear. Orbits appear normal. Mastoids are clear. Other: None significant IMPRESSION: No acute finding. No cause of the presenting symptoms is identified. Electronically Signed   By: Paulina Fusi M.D.   On: 04/11/2017 19:25   Ct Lumbar Spine Wo Contrast  Result Date: 04/11/2017 CLINICAL DATA:  Larey Seat this morning. Low back pain. History of LEFT ventricular assist device. EXAM: CT LUMBAR SPINE WITHOUT CONTRAST TECHNIQUE: Multidetector CT imaging of the lumbar spine was performed without intravenous contrast administration. Multiplanar CT image reconstructions were also generated. COMPARISON:  Lumbar spine radiographs April 11, 2017 and CT abdomen and pelvis May 11, 2016 FINDINGS: SEGMENTATION: For the purposes of this report the last well-formed intervertebral disc space is reported as L5-S1. ALIGNMENT: Maintained lumbar lordosis. Grade 1 L5-S1 anterolisthesis with chronic bilateral L5 pars interarticularis defects. VERTEBRAE: Acute mild L1 superior endplate burst fracture with less than 25% height loss, 1-2 mm retropulsed bony fragments. Remaining lumbar vertebral bodies intact. Scattered Schmorl's nodes. Osteopenia without destructive bony lesions. PARASPINAL AND OTHER SOFT TISSUES: Small RIGHT pleural effusion. Moderate calcific atherosclerosis. DISC LEVELS: T11-12: Small broad-based disc osteophyte complex without canal stenosis or neural foraminal narrowing. T12-L1, L1-2:  Small broad-based disc osteophyte complex. Mild facet arthropathy and ligamentum flavum redundancy without canal stenosis or neural foraminal narrowing. L2-3: Small broad-based disc bulge. Minimal prevertebral soft tissue at L2 suggesting disc extrusion or blood products (series 4, image 58). No canal stenosis or neural foraminal narrowing. L3-4: Small broad-based disc bulge. Mild facet arthropathy and ligamentum flavum redundancy without canal stenosis. Mild LEFT neural foraminal narrowing. L4-5: Small broad-based and central disc protrusion. Mild facet arthropathy and ligamentum flavum redundancy without canal stenosis. Mild bilateral neural foraminal narrowing. L5-S1: Anterolisthesis. Unroofing of disc osteophyte complex. Mild facet arthropathy without canal stenosis. Severe bilateral neural foraminal narrowing. IMPRESSION: 1. Acute mild L1 burst fracture. Small ventral epidural L2 hemorrhage versus disc extrusion. 2. Grade 1 L5-S1 anterolisthesis on the basis of chronic L5 pars interarticularis defects. 3. No canal stenosis. Neural foraminal narrowing L3-4 through L5-S1: Severe at L5-S1. 4. Small RIGHT pleural effusion. Aortic Atherosclerosis (ICD10-I70.0). Electronically Signed   By: Awilda Metro M.D.   On: 04/11/2017 22:49      Medications:     Scheduled Medications: . atorvastatin  40 mg Oral Daily  . danazol  100 mg Oral BID  . ezetimibe  10 mg Oral Daily  . ferrous sulfate  325 mg Oral BID  . insulin aspart protamine- aspart  12 Units Subcutaneous BID WC  . pantoprazole  40 mg Oral BID  . sildenafil  20 mg Oral BID  . Warfarin - Pharmacist Dosing Inpatient   Does not apply q1800     Infusions:   PRN Medications:  acetaminophen, fentaNYL (SUBLIMAZE) injection, hydrALAZINE, ondansetron (ZOFRAN) IV, oxyCODONE-acetaminophen   Patient Profile   62 y.o.smoker with history of PAD, CAD, and ischemic cardiomyopathy now s/p HVAD. Admitted with a fall.   Assessment/Plan:    1.  Syncope: This took place in the setting of increased low flow and suction alarms since 4/4, then episode of dry heaving followed by urination directly proceeded syncope.  Review of log files suggests increased afterload or decreased preload state since around 4/4.  He has been out of losartan and has been taking torsemide 60 mg bid.  Possible high afterload with elevated MAP + some degree of dehydration followed by vagal event from dry heaving/nausea + urination. Speed turned down to 2800 yesterday. LDH at 217, no evidence for pump thrombosis.  - Torsemide on hold today, restart lower dose tomorrow.  - Restart losartan 25 mg bid.  - Will need ramp echo today.  2. L1 burst fracture: S/p syncopal episode.  Significant pain.   - Pain control.  - Back corset - spine consult today.  3. Chronic systolic CHF: Ischemic cardiomyopathy. Echo in 3/18 with EF 20-25%.[employer] Jude ICD. He is now s/p HVAD placement. Lavare cycle is on.Felt to have degree of RV failure and will not tolerate CVP down to normal range.  No evidence for pump thrombosis.  - Hold torsemide today, restart lower dose in am.  - Control MAP => restart losartan.  - Ramp echo.  - He is going to think about whether he would eventually want to have transplant evaluation. Will have to stop smoking.  4. HTN: Restart losartan.  5. CAD: 3 vessel CAD, s/p stent to proximal LAD and PTCA to subtotally occluded large OM1 in 1/18. - Now on warfarin. ASA stopped with GI bleeding.  - Continue atorvastatin 80 mg daily.  6. PAD: Stable claudication, notes with moderate walking on left. Left fem-pop bypass occluded. No pedal ulcerations. Medical management per Dr. Edilia Bo.  7. Carotid stenosis: Followed at [facility].  8. R Pleural effusion: PleurX catheter in past.  9. RV failure: Continue Revatio 20 mg tid.  10. 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 ASA was decreased to 81 daily. Warfarin INR goal is now 2-2.5. He started monthly octreotide  injections but had profuse diarrhea with octreotide after 1st injection and after 2nd injection with decreased dose. He is tolerating danazol without problems. 12/18 had more melena in setting of high INR =>due to vigorous epistaxis versus AVM bleeding. ASA was stopped.  - He will stay off ASA 81 and will continue INR goal 2-2.5 for now.  - Continue danazol.  11. Nausea/dry heaves: Chronic, occurs frequently in the mornings.  He was told in past by GI that it is due to Barrett's esophagus (??).   - Will continue PPI.  - Will ask GI to see him, nausea/vomiting is a problem as it can drop his BP and played a role in his syncope this time.   I reviewed the LVAD parameters from today, and compared the results to the patient's prior recorded data.  No programming changes were made.  The LVAD is functioning within specified parameters.  The patient performs LVAD self-test daily.  LVAD interrogation was negative for any significant power changes, alarms or PI events/speed drops.  LVAD equipment check completed and is in good working order.  Back-up equipment present.   LVAD education done on emergency procedures and precautions and reviewed exit site care.  Length of Stay: 1  Marca Ancona, MD 04/12/2017, 7:52 AM  VAD Team --- VAD ISSUES ONLY--- Pager [phone] (7am - 7am)  Advanced Heart Failure Team  Pager [phone] (M-F; 7a - 4p)  Please contact [facility] for night-coverage after hours (4p -7a ) and weekends on [email or web address]

## 2017-04-12 NOTE — Progress Notes (Signed)
LVAD Coordinator Rounding Note:  Admitted 04/11/17 to Dr. Shirlee LatchMcLean due to recurrent syncope.  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 Maren BeachVanTrigt under Destination Therapy criteria due to current smoking status.  Vital signs: Temp: 98.4 HR: 76 Doppler Pressure:96 Automatic BP:  104/81 (90) O2 Sat: 98% RA Wt: 233>232 lbs  HVAD Interrogation Reveals: Speed: 2800 Flow: 4.0 Power: 4.7w Peak/Trough: 6.2/2.8 Flow alarm limit: 2.0 Power alarm limit: 7.0 Hematocrit: 42 Suction alarm: on Lavare cycle: on  Drive Line:  Left abdominal sorbaview dressing dry and intact; anchor intact and accurately applied. Weekly dressings; bedside RN may change dressing has needed.  Labs:  LDH trend: 248>217  INR trend: 3.0>3.0  Anticoagulation Plan: -INR Goal: 2.0 - 2.5 -ASA Dose: no ASA due to hx of GI bleed  Device: - St Jude single lead -Therapies: on at 200 bpm  Adverse Events on VAD: 05/31/16 - Controller power-up associated with pump start event. Controller change out performed.  06/01/16 - Controller pins lubricated per [employer] reps  06/17/16 - Controller power-up associated with pump start event while changing power source 06/22/16 - Controller pins re-lubricated per [employer] reps    Plan/Recommendations:   1. Ramp echo today 2. Please call VAD pager with any VAD equipment or drive line issues  Hessie DienerMolly Reece RN, VAD Coordinator 24/7 VAD Pager: [phone]

## 2017-04-12 NOTE — Consult Note (Signed)
Reason for Consult: L1 fracture Referring Physician: Dr. Arlester Marker is an 62 y.o. male.  HPI: The patient is a 62 year old white male with multiple medical problems who had a syncopal event and took a fall yesterday.  He was admitted for observation.  Further workup included a lumbar CT which demonstrated a mild L1 fracture.  A neurosurgical consultation has been requested.  Presently the patient is alert and pleasant.  He complains of acute back pain at the lumbosacral junction since his fall.  He is not having radicular symptoms.  Past Medical History:  Diagnosis Date  . AICD (automatic cardioverter/defibrillator) present   . Arthritis   . Barretts syndrome   . Carotid artery disease ([place])    a. Dopp 09/2011: 16-10% RICA, 96-04% LICA.;  b.  Carotid US (7/14):  Bilateral 60-79% => f/u 6 mos  . Cerebrovascular disease   . Chronic systolic heart failure ([place])   . Coronary artery disease    a. AWMI requiring IABP 2005 s/p Horizon study stent-LAD, staged BMS-Cx, DESx2-RCA. b. Last Last LHC (6/06):  EF 25%, pLAD stent 40-50, after stent 40, dLAD 20, pCFX 20, pOM1 70, pRCA 20, mRCA stents ok.=> med Rx;  c.  Myoview (7/14):  EF 26%, large ant, septal, inf and apical infarct, no ischemia.  Med Rx continued  . Diabetes mellitus   . Dyspnea    with exertion  . Headache   . Heart murmur   . History of kidney stones   . HTN (hypertension)   . Hyperlipidemia    mixed  . ICD (implantable cardiac defibrillator), dual, in situ    St. Jude for severe LVD EF 25% 2/07 explanted 2010. Medtronic Virtuoso II DR Dual-chamber cardiverter-defibrillator  with pocket revision, Dr. Caryl Comes  . Ischemic cardiomyopathy    a. Echo (09/27/12): EF 20%, diffuse HK with periapical AK, no LV thrombus noted, restrictive physiology, trivial MR, mild LAE, RVSF mildly reduced, PASP 39  . Pleural effusion 01/05/2016  . Presence of permanent cardiac pacemaker   . PVD (peripheral vascular disease) ([facility])    a. ABI  09/2011 - R normal, L moderate - saw Dr. Fletcher Anon - med rx.   Marland Kitchen RIATA ICD Lead--on advisory recall    . Tobacco abuse     Past Surgical History:  Procedure Laterality Date  . ABDOMINAL AORTAGRAM N/A 11/06/2013   Procedure: ABDOMINAL Maxcine Ham;  Surgeon: Angelia Mould, MD;  Location: [facility] CATH LAB;  Service: Cardiovascular;  Laterality: N/A;  . APPENDECTOMY  2000   ruptured  . CARDIAC CATHETERIZATION N/A 01/07/2016   Procedure: Right/Left Heart Cath and Coronary Angiography;  Surgeon: Larey Dresser, MD;  Location: [place] CV LAB;  Service: Cardiovascular;  Laterality: N/A;  . CARDIAC CATHETERIZATION N/A 01/28/2016   Procedure: Coronary Stent Intervention;  Surgeon: Sherren Mocha, MD;  Location: [place] CV LAB;  Service: Cardiovascular;  Laterality: N/A;  . CARDIAC CATHETERIZATION N/A 01/28/2016   Procedure: Right Heart Cath;  Surgeon: Sherren Mocha, MD;  Location: [place] CV LAB;  Service: Cardiovascular;  Laterality: N/A;  . CARDIAC CATHETERIZATION N/A 01/28/2016   Procedure: Intravascular Ultrasound/IVUS;  Surgeon: Sherren Mocha, MD;  Location: [place] CV LAB;  Service: Cardiovascular;  Laterality: N/A;  . CHEST TUBE INSERTION Right 04/07/2016   Procedure: INSERTION PLEURAL DRAINAGE CATHETER;  Surgeon: Ivin Poot, MD;  Location: [place];  Service: Thoracic;  Laterality: Right;  . ENTEROSCOPY N/A 07/10/2016   Procedure: ENTEROSCOPY;  Surgeon: Laurence Spates, MD;  Location: [facility] ENDOSCOPY;  Service: Endoscopy;  Laterality: N/A;  . FEMORAL-POPLITEAL BYPASS GRAFT Right 11/07/2013   Procedure: Right FEMORAL-POPLITEAL ARTERY Bypass ;  Surgeon: Angelia Mould, MD;  Location: [place];  Service: Vascular;  Laterality: Right;  . FEMORAL-POPLITEAL BYPASS GRAFT Left 11/17/2014   Procedure: BYPASS GRAFT FEMORAL-POPLITEAL ARTERY USING GORE PROPATEN 6MM X 80CM VASCULAR GRAFT;  Surgeon: Angelia Mould, MD;  Location: [place];  Service: Vascular;  Laterality: Left;  . HOT HEMOSTASIS  N/A 07/10/2016   Procedure: HOT HEMOSTASIS (ARGON PLASMA COAGULATION/BICAP);  Surgeon: Laurence Spates, MD;  Location: [facility] ENDOSCOPY;  Service: Endoscopy;  Laterality: N/A;  . IMPLANTABLE CARDIOVERTER DEFIBRILLATOR (ICD) GENERATOR CHANGE N/A 12/16/2012   Procedure: ICD GENERATOR CHANGE;  Surgeon: Deboraha Sprang, MD;  Location: [facility] CATH LAB;  Service: Cardiovascular;  Laterality: N/A;  . INSERTION OF IMPLANTABLE LEFT VENTRICULAR ASSIST DEVICE N/A 05/30/2016   Procedure: INSERTION OF IMPLANTABLE LEFT VENTRICULAR ASSIST DEVICE;  Surgeon: Ivin Poot, MD;  Location: [place];  Service: Open Heart Surgery;  Laterality: N/A;  CIRC ARREST  NITRIC OXIDE  . INTRAOPERATIVE ARTERIOGRAM Right 11/07/2013   Procedure: INTRA OPERATIVE ARTERIOGRAM;  Surgeon: Angelia Mould, MD;  Location: [place];  Service: Vascular;  Laterality: Right;  . LOWER EXTREMITY ANGIOGRAM Bilateral 11/06/2013   Procedure: LOWER EXTREMITY ANGIOGRAM;  Surgeon: Angelia Mould, MD;  Location: [facility] CATH LAB;  Service: Cardiovascular;  Laterality: Bilateral;  . Medtronic Virtuoso II DR dual-chamber cardioverter-defibrillation with pocket revison     Dr. Virl Axe  . MULTIPLE EXTRACTIONS WITH ALVEOLOPLASTY N/A 04/26/2016   Procedure: Extraction of tooth #'s 5-14 and 20-30 with alveoloplasty;  Surgeon: Lenn Cal, DDS;  Location: [place];  Service: Oral Surgery;  Laterality: N/A;  . PERIPHERAL VASCULAR CATHETERIZATION N/A 09/25/2014   Procedure: Abdominal Aortogram;  Surgeon: Elam Dutch, MD;  Location: [place] CV LAB;  Service: Cardiovascular;  Laterality: N/A;  . REMOVAL OF PLEURAL DRAINAGE CATHETER Right 06/13/2016   Procedure: REMOVAL OF PLEURAL DRAINAGE CATHETER;  Surgeon: Ivin Poot, MD;  Location: [place];  Service: Thoracic;  Laterality: Right;  . RIGHT HEART CATH N/A 05/01/2016   Procedure: Right Heart Cath;  Surgeon: Larey Dresser, MD;  Location: [place] CV LAB;  Service: Cardiovascular;  Laterality: N/A;   . RIGHT HEART CATH N/A 05/25/2016   Procedure: Right Heart Cath;  Surgeon: Larey Dresser, MD;  Location: [place] CV LAB;  Service: Cardiovascular;  Laterality: N/A;  . TEE WITHOUT CARDIOVERSION N/A 05/30/2016   Procedure: TRANSESOPHAGEAL ECHOCARDIOGRAM (TEE);  Surgeon: Prescott Gum, Collier Salina, MD;  Location: [place];  Service: Open Heart Surgery;  Laterality: N/A;  . US GUIDED THORACENTESIS RIGHT ([place] HX) Right 01/05/2016    Family History  Problem Relation Age of Onset  . Diabetes Mother   . Coronary artery disease Father   . Heart disease Father        before age 16    Social History:  reports that he has been smoking cigarettes.  He has a 22.50 pack-year smoking history. He has never used smokeless tobacco. He reports that he does not drink alcohol or use drugs.  Allergies:  Allergies  Allergen Reactions  . Metformin And Related Nausea And Vomiting    *Only the extended release* (does this)  . Niacin And Related Other (See Comments)    REACTION IS SIDE EFFECT "SEVERE" FLUSHING    Medications:  I have reviewed the patient's current medications. Prior to Admission:  Medications  Prior to Admission  Medication Sig Dispense Refill Last Dose  . atorvastatin (LIPITOR) 40 MG tablet Take 1 tablet (40 mg total) by mouth daily. 90 tablet 3 04/10/2017 at Unknown time  . danazol (DANOCRINE) 100 MG capsule Take 1 capsule (100 mg total) by mouth 2 (two) times daily. 60 capsule 6 04/10/2017 at Unknown time  . ezetimibe (ZETIA) 10 MG tablet Take 1 tablet (10 mg total) by mouth daily. 90 tablet 3 04/10/2017 at Unknown time  . Ferrous Sulfate (IRON) 325 (65 Fe) MG TABS Take 1 tablet (325 mg total) by mouth 3 (three) times daily. (Patient taking differently: Take 1 each by mouth 2 (two) times daily. ) 90 each 11 04/10/2017 at Unknown time  . insulin NPH-regular Human (NOVOLIN 70/30) (70-30) 100 UNIT/ML injection Inject 12 Units into the skin 3 (three) times daily.    04/10/2017 at Unknown time  . losartan  (COZAAR) 25 MG tablet Take 1 tablet (25 mg total) by mouth 2 (two) times daily. 60 tablet 6 04/10/2017 at Unknown time  . pantoprazole (PROTONIX) 40 MG tablet Take 1 tablet (40 mg total) by mouth 2 (two) times daily. 60 tablet 6 04/10/2017 at Unknown time  . potassium chloride SA (K-DUR,KLOR-CON) 20 MEQ tablet Take 2 tablets (40 mEq total) by mouth 2 (two) times daily. (Patient taking differently: Take 40 mEq by mouth 2 (two) times daily. Patient taking one pill twice daily; "difficult to swallow") 90 tablet 3 04/10/2017 at Unknown time  . sildenafil (REVATIO) 20 MG tablet Take 1 tablet (20 mg total) by mouth 3 (three) times daily. (Patient taking differently: Take 20 mg by mouth 2 (two) times daily. ) 90 tablet 11 04/10/2017 at Unknown time  . torsemide (DEMADEX) 20 MG tablet Take 3 tablets (60 mg total) by mouth 2 (two) times daily. 180 tablet 3 04/10/2017 at Unknown time  . warfarin (COUMADIN) 5 MG tablet Take 1 tablet (5 mg total) by mouth daily. 30 tablet 11 04/10/2017 at Unknown time  . blood glucose meter kit and supplies Ultra Blue Kit Dispense based on patient and insurance preference. Use up to four times daily as directed. (FOR ICD-9 250.00, 250.01). (Patient not taking: Reported on 04/11/2017) 1 each 0 Not Taking at Unknown time   Scheduled: . atorvastatin  40 mg Oral Daily  . danazol  100 mg Oral BID  . ezetimibe  10 mg Oral Daily  . ferrous sulfate  325 mg Oral BID  . insulin aspart protamine- aspart  12 Units Subcutaneous BID WC  . pantoprazole  40 mg Oral BID  . sildenafil  20 mg Oral BID  . Warfarin - Pharmacist Dosing Inpatient   Does not apply q1800   Continuous:  KPT:[id number], fentaNYL (SUBLIMAZE) injection, hydrALAZINE, ondansetron (ZOFRAN) IV, oxyCODONE-acetaminophen Anti-infectives (From admission, onward)   None       Results for orders placed or performed during the hospital encounter of 04/11/17 (from the past 48 hour(s))  CBC with Differential     Status: Abnormal    Collection Time: 04/11/17  7:41 PM  Result Value Ref Range   WBC 10.2 4.0 - 10.5 K/uL   RBC 5.17 4.22 - 5.81 MIL/uL   Hemoglobin 14.3 13.0 - 17.0 g/dL   HCT 43.3 39.0 - 52.0 %   MCV 83.8 78.0 - 100.0 fL   MCH 27.7 26.0 - 34.0 pg   MCHC 33.0 30.0 - 36.0 g/dL   RDW 16.4 (H) 11.5 - 15.5 %   Platelets 147 (L) 150 -  400 K/uL   Neutrophils Relative % 90 %   Neutro Abs 9.2 (H) 1.7 - 7.7 K/uL   Lymphocytes Relative 7 %   Lymphs Abs 0.7 0.7 - 4.0 K/uL   Monocytes Relative 3 %   Monocytes Absolute 0.3 0.1 - 1.0 K/uL   Eosinophils Relative 0 %   Eosinophils Absolute 0.0 0.0 - 0.7 K/uL   Basophils Relative 0 %   Basophils Absolute 0.0 0.0 - 0.1 K/uL    Comment: Performed at [facility] [address] [place], [place] [address]  Basic metabolic panel     Status: Abnormal   Collection Time: 04/11/17  7:41 PM  Result Value Ref Range   Sodium 137 135 - 145 mmol/L   Potassium 4.0 3.5 - 5.1 mmol/L   Chloride 98 (L) 101 - 111 mmol/L   CO2 24 22 - 32 mmol/L   Glucose, Bld 175 (H) 65 - 99 mg/dL   BUN 14 6 - 20 mg/dL   Creatinine, Ser 1.40 (H) 0.61 - 1.24 mg/dL   Calcium 8.9 8.9 - 10.3 mg/dL   GFR calc non Af Amer 52 (L) >60 mL/min   GFR calc Af Amer >60 >60 mL/min    Comment: (NOTE) The eGFR has been calculated using the CKD EPI equation. This calculation has not been validated in all clinical situations. eGFR's persistently <60 mL/min signify possible Chronic Kidney Disease.    Anion gap 15 5 - 15    Comment: Performed at [facility] [address] [place], [place] [address]  Protime-INR     Status: Abnormal   Collection Time: 04/11/17  7:41 PM  Result Value Ref Range   Prothrombin Time 31.1 (H) 11.4 - 15.2 seconds   INR 3.02     Comment: Performed at [facility] [address], [place], [place] [address]  Lactate dehydrogenase     Status: Abnormal   Collection Time: 04/11/17  7:41 PM  Result Value Ref Range   LDH 248 (H) 98 - 192 U/L    Comment: Performed  at [facility], [place] [address], [place], [place] [address]  Glucose, capillary     Status: Abnormal   Collection Time: 04/12/17 12:39 AM  Result Value Ref Range   Glucose-Capillary 145 (H) 65 - 99 mg/dL   Comment 1 Notify RN    Comment 2 Document in Chart   Protime-INR     Status: Abnormal   Collection Time: 04/12/17  2:34 AM  Result Value Ref Range   Prothrombin Time 31.0 (H) 11.4 - 15.2 seconds   INR 3.02     Comment: Performed at [facility], [place] [address] [place], [place] [address]  Lactate dehydrogenase     Status: Abnormal   Collection Time: 04/12/17  2:34 AM  Result Value Ref Range   LDH 217 (H) 98 - 192 U/L    Comment: Performed at [facility], [place] [address], [place], [place] [address]  CBC     Status: Abnormal   Collection Time: 04/12/17  2:34 AM  Result Value Ref Range   WBC 9.5 4.0 - 10.5 K/uL   RBC 4.96 4.22 - 5.81 MIL/uL   Hemoglobin 13.6 13.0 - 17.0 g/dL   HCT 42.1 39.0 - 52.0 %   MCV 84.9 78.0 - 100.0 fL   MCH 27.4 26.0 - 34.0 pg   MCHC 32.3 30.0 - 36.0 g/dL   RDW 16.6 (H) 11.5 -  15.5 %   Platelets 150 150 - 400 K/uL    Comment: Performed at [facility], [place] [address] [place], [place] [address]  Basic metabolic panel     Status: Abnormal   Collection Time: 04/12/17  2:34 AM  Result Value Ref Range   Sodium 138 135 - 145 mmol/L   Potassium 3.9 3.5 - 5.1 mmol/L   Chloride 99 (L) 101 - 111 mmol/L   CO2 27 22 - 32 mmol/L   Glucose, Bld 168 (H) 65 - 99 mg/dL   BUN 13 6 - 20 mg/dL   Creatinine, Ser 1.35 (H) 0.61 - 1.24 mg/dL   Calcium 8.8 (L) 8.9 - 10.3 mg/dL   GFR calc non Af Amer 55 (L) >60 mL/min   GFR calc Af Amer >60 >60 mL/min    Comment: (NOTE) The eGFR has been calculated using the CKD EPI equation. This calculation has not been validated in all clinical situations. eGFR's persistently <60 mL/min signify possible Chronic Kidney Disease.    Anion gap 12 5 - 15    Comment: Performed at [facility]  [address], [place], [place] [address]  Glucose, capillary     Status: Abnormal   Collection Time: 04/12/17  8:18 AM  Result Value Ref Range   Glucose-Capillary 262 (H) 65 - 99 mg/dL   Comment 1 Notify RN    Comment 2 Document in Chart     Dg Chest 1 View  Result Date: 04/11/2017 CLINICAL DATA:  Low back pain after fall EXAM: CHEST  1 VIEW COMPARISON:  08/10/2016 FINDINGS: Post sternotomy changes. Left-sided pacing device as before. LVAD similar in position. Cardiomegaly with mild vascular congestion. No acute airspace disease, pleural effusion or pneumothorax. Aortic atherosclerosis. Old right-sided rib fractures. Probable zipper artifact at the thoracic inlet. IMPRESSION: No active disease.  Cardiomegaly with vascular congestion. Electronically Signed   By: Donavan Foil M.D.   On: 04/11/2017 19:23   Dg Lumbar Spine Complete  Result Date: 04/11/2017 CLINICAL DATA:  Low back pain after fall EXAM: LUMBAR SPINE - COMPLETE 4+ VIEW COMPARISON:  CT 04/03/2013, 05/11/2016 FINDINGS: Grade 1 anterolisthesis of L5 on S1 with chronic bilateral pars defect at L5. Age indeterminate mild superior endplate deformity at L1. Remaining vertebral body heights are normal. Aortic atherosclerosis. Moderate degenerative changes at L5-S1. IMPRESSION: 1. Age indeterminate mild superior endplate deformity at L1 2. Grade 1 anterolisthesis of L5 on S1 with chronic bilateral pars defect at L5 Electronically Signed   By: Donavan Foil M.D.   On: 04/11/2017 19:21   Ct Head Wo Contrast  Result Date: 04/11/2017 CLINICAL DATA:  [place] today with altered mental status. Left ventricular assist device patient. EXAM: CT HEAD WITHOUT CONTRAST TECHNIQUE: Contiguous axial images were obtained from the base of the skull through the vertex without intravenous contrast. COMPARISON:  None. FINDINGS: Brain: The brain shows a normal appearance without evidence of malformation, accelerated atrophy, old or acute small or large vessel infarction, mass  lesion, hemorrhage, hydrocephalus or extra-axial collection. Vascular: There is atherosclerotic calcification of the major vessels at the base of the brain. Skull: Normal.  No traumatic finding.  No focal bone lesion. Sinuses/Orbits: Sinuses are clear. Orbits appear normal. Mastoids are clear. Other: None significant IMPRESSION: No acute finding. No cause of the presenting symptoms is identified. Electronically Signed   By: Nelson Chimes M.D.   On: 04/11/2017 19:25   Ct Lumbar Spine Wo Contrast  Result Date: 04/11/2017 CLINICAL DATA:  Fell this morning. Low back pain. History of LEFT ventricular assist device. EXAM: CT LUMBAR SPINE WITHOUT CONTRAST TECHNIQUE: Multidetector CT imaging of the lumbar spine was performed without intravenous contrast administration. Multiplanar CT image reconstructions were also generated. COMPARISON:  Lumbar spine radiographs April 11, 2017 and CT abdomen and pelvis May 11, 2016 FINDINGS: SEGMENTATION: For the purposes of this report the last well-formed intervertebral disc space is reported as L5-S1. ALIGNMENT: Maintained lumbar lordosis. Grade 1 L5-S1 anterolisthesis with chronic bilateral L5 pars interarticularis defects. VERTEBRAE: Acute mild L1 superior endplate burst fracture with less than 25% height loss, 1-2 mm retropulsed bony fragments. Remaining lumbar vertebral bodies intact. Scattered Schmorl's nodes. Osteopenia without destructive bony lesions. PARASPINAL AND OTHER SOFT TISSUES: Small RIGHT pleural effusion. Moderate calcific atherosclerosis. DISC LEVELS: T11-12: Small broad-based disc osteophyte complex without canal stenosis or neural foraminal narrowing. T12-L1, L1-2: Small broad-based disc osteophyte complex. Mild facet arthropathy and ligamentum flavum redundancy without canal stenosis or neural foraminal narrowing. L2-3: Small broad-based disc bulge. Minimal prevertebral soft tissue at L2 suggesting disc extrusion or blood products (series 4, image 58). No canal  stenosis or neural foraminal narrowing. L3-4: Small broad-based disc bulge. Mild facet arthropathy and ligamentum flavum redundancy without canal stenosis. Mild LEFT neural foraminal narrowing. L4-5: Small broad-based and central disc protrusion. Mild facet arthropathy and ligamentum flavum redundancy without canal stenosis. Mild bilateral neural foraminal narrowing. L5-S1: Anterolisthesis. Unroofing of disc osteophyte complex. Mild facet arthropathy without canal stenosis. Severe bilateral neural foraminal narrowing. IMPRESSION: 1. Acute mild L1 burst fracture. Small ventral epidural L2 hemorrhage versus disc extrusion. 2. Grade 1 L5-S1 anterolisthesis on the basis of chronic L5 pars interarticularis defects. 3. No canal stenosis. Neural foraminal narrowing L3-4 through L5-S1: Severe at L5-S1. 4. Small RIGHT pleural effusion. Aortic Atherosclerosis (ICD10-I70.0). Electronically Signed   By: Elon Alas M.D.   On: 04/11/2017 22:49    ROS Blood pressure 104/81, pulse 82, temperature 98.6 F (37 C), temperature source Oral, resp. rate 14, height 6' (1.829 m), weight 105.3 kg (232 lb 2.3 oz), SpO2 100 %. Estimated body mass index is 31.48 kg/m as calculated from the following:   Height as of this encounter: 6' (1.829 m).   Weight as of this encounter: 105.3 kg (232 lb 2.3 oz).  Physical Exam  General: An alert and pleasant 62 year old white male in no apparent distress in bed.  HEENT: Normocephalic, extraocular muscles are intact, edentulous  Neck: Supple without deformities.  He has a normal range of motion.  Thorax: Symmetric  Abdomen: Obese and soft  Extremities: Unremarkable  Neurologic exam: The patient is alert and oriented x3.  Cranial nerves II through XII are examined bilaterally grossly normal.  The patient's motor strength is 5/5 in his bilateral bicep, triceps, handgrip, quadricep, gastrocnemius, and dorsiflexors.  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  Cerebellar function is intact to rapid alternating movements of the upper extremity bilaterally.  I have reviewed the patient's lumbar CT performed yesterday.  It demonstrates the patient has a mild L1 compression fracture.  Assessment/Plan: L1 compression fracture: This should heal in an orthosis.  Please have the patient follow-up with me in about 6 weeks in the office.  Please call if I can be of further assistance.  Ophelia Charter 04/12/2017, 9:47 AM

## 2017-04-12 NOTE — Progress Notes (Signed)
ANTICOAGULATION CONSULT NOTE - Follow Up Consult  Pharmacy Consult for warfarin dosing Indication: LVAD  Allergies  Allergen Reactions  . Metformin And Related Nausea And Vomiting    *Only the extended release* (does this)  . Niacin And Related Other (See Comments)    REACTION IS SIDE EFFECT "SEVERE" FLUSHING    Patient Measurements: Height: 6' (182.9 cm) Weight: 232 lb 2.3 oz (105.3 kg) IBW/kg (Calculated) : 77.6   Vital Signs: Temp: 98.4 F (36.9 C) (04/11 1208) Temp Source: Oral (04/11 1208) BP: 106/94 (04/11 1208) Pulse Rate: 76 (04/11 1208)  Labs: Recent Labs    04/11/17 1941 04/12/17 0234  HGB 14.3 13.6  HCT 43.3 42.1  PLT 147* 150  LABPROT 31.1* 31.0*  INR 3.02 3.02  CREATININE 1.40* 1.35*    Estimated Creatinine Clearance: 71.2 mL/min (A) (by C-G formula based on SCr of 1.35 mg/dL (H)).  Assessment: 62 yo male admitted through ED with syncope and fall. Had LVAD placed 5/18- past cardiac history CAD and CHF. Pharmacy consulted for warfarin dosing. Patient has hx of upper/lower GI bleed and has INR goal of 2-2.5. Warfarin dose held yesterday d/t INR of 3.02. INR today is unchanged at 3.02.   Goal of Therapy:  INR 2.0-2.5 Monitor platelets by anticoagulation protocol: Yes   Plan:  Hold warfarin X1 tonight Daily INR and CBC Monitor s/sx of bleeding  Emeline GeneralWhitney Lilliemae Fruge, PharmD Candidate 04/12/2017,2:32 PM

## 2017-04-12 NOTE — Plan of Care (Signed)
Continue current care plan 

## 2017-04-12 NOTE — Consult Note (Addendum)
[facility] Consult  Referring Provider: Jolaine Artist, MD Primary Care Physician:  Kathyrn Lass, MD Primary Gastroenterologist: [facility]/Dr.Edwards  Reason for Consultation:  Vasovagal syncope related to dry heaves  HPI: [name] is a 62 y.o. male known to [place] from prior admission and recent office visit on 1/19, after a follow-up with an EGD/enteroscopy which showed gastric AVMs treated with APC. Colonoscopy from 2008 showed tubular adenomas and patient has not been able to get surveillance colonoscopy due to underlying comorbid conditions. He was admitted on 04/11/2017 with syncope ater having dry heaves.he has an ICD, history of PAD, CAD, ischemic cardiomyopathy,H VAD placed on 05/30/16, and on admission was found to haveacute endplate fracture at L1. GI was consulted as patient has acid reflux and dry heaves which probably precipitated a vasovagal event. Patient has had change in bowel habits, fluctuates from having diarrhea took no bowel movement for a few days, denies seeing blood in stool or black stools. He complains of generalized mild abdominal discomfort, denies nausea or vomiting but complains of bile reflux.    Past Medical History:  Diagnosis Date  . AICD (automatic cardioverter/defibrillator) present   . Arthritis   . Barretts syndrome   . Carotid artery disease ([place])    a. Dopp 09/2011: 38-93% RICA, 73-42% LICA.;  b.  Carotid US (7/14):  Bilateral 60-79% => f/u 6 mos  . Cerebrovascular disease   . Chronic systolic heart failure ([employer])   . Coronary artery disease    a. AWMI requiring IABP 2005 s/p Horizon study stent-LAD, staged BMS-Cx, DESx2-RCA. b. Last Last LHC (6/06):  EF 25%, pLAD stent 40-50, after stent 40, dLAD 20, pCFX 20, pOM1 70, pRCA 20, mRCA stents ok.=> med Rx;  c.  Myoview (7/14):  EF 26%, large ant, septal, inf and apical infarct, no ischemia.  Med Rx continued  . Diabetes mellitus   . Dyspnea    with exertion  . Headache   . Heart murmur    . History of kidney stones   . HTN (hypertension)   . Hyperlipidemia    mixed  . ICD (implantable cardiac defibrillator), dual, in situ    St. Jude for severe LVD EF 25% 2/07 explanted 2010. Medtronic Virtuoso II DR Dual-chamber cardiverter-defibrillator  with pocket revision, Dr. Caryl Comes  . Ischemic cardiomyopathy    a. Echo (09/27/12): EF 20%, diffuse HK with periapical AK, no LV thrombus noted, restrictive physiology, trivial MR, mild LAE, RVSF mildly reduced, PASP 39  . Pleural effusion 01/05/2016  . Presence of permanent cardiac pacemaker   . PVD (peripheral vascular disease) ([facility])    a. ABI 09/2011 - R normal, L moderate - saw Dr. Fletcher Anon - med rx.   Marland Kitchen RIATA ICD Lead--on advisory recall    . Tobacco abuse     Past Surgical History:  Procedure Laterality Date  . ABDOMINAL AORTAGRAM N/A 11/06/2013   Procedure: ABDOMINAL Maxcine Ham;  Surgeon: Angelia Mould, MD;  Location: [facility] CATH LAB;  Service: Cardiovascular;  Laterality: N/A;  . APPENDECTOMY  2000   ruptured  . CARDIAC CATHETERIZATION N/A 01/07/2016   Procedure: Right/Left Heart Cath and Coronary Angiography;  Surgeon: Larey Dresser, MD;  Location: [place] CV LAB;  Service: Cardiovascular;  Laterality: N/A;  . CARDIAC CATHETERIZATION N/A 01/28/2016   Procedure: Coronary Stent Intervention;  Surgeon: Sherren Mocha, MD;  Location: [place] CV LAB;  Service: Cardiovascular;  Laterality: N/A;  . CARDIAC CATHETERIZATION N/A 01/28/2016   Procedure: Right Heart Cath;  Surgeon: Sherren Mocha, MD;  Location: [place] CV LAB;  Service: Cardiovascular;  Laterality: N/A;  . CARDIAC CATHETERIZATION N/A 01/28/2016   Procedure: Intravascular Ultrasound/IVUS;  Surgeon: Sherren Mocha, MD;  Location: [place] CV LAB;  Service: Cardiovascular;  Laterality: N/A;  . CHEST TUBE INSERTION Right 04/07/2016   Procedure: INSERTION PLEURAL DRAINAGE CATHETER;  Surgeon: Ivin Poot, MD;  Location: [place];  Service: Thoracic;  Laterality:  Right;  . ENTEROSCOPY N/A 07/10/2016   Procedure: ENTEROSCOPY;  Surgeon: Laurence Spates, MD;  Location: [facility] ENDOSCOPY;  Service: Endoscopy;  Laterality: N/A;  . FEMORAL-POPLITEAL BYPASS GRAFT Right 11/07/2013   Procedure: Right FEMORAL-POPLITEAL ARTERY Bypass ;  Surgeon: Angelia Mould, MD;  Location: [place];  Service: Vascular;  Laterality: Right;  . FEMORAL-POPLITEAL BYPASS GRAFT Left 11/17/2014   Procedure: BYPASS GRAFT FEMORAL-POPLITEAL ARTERY USING GORE PROPATEN 6MM X 80CM VASCULAR GRAFT;  Surgeon: Angelia Mould, MD;  Location: [place];  Service: Vascular;  Laterality: Left;  . HOT HEMOSTASIS N/A 07/10/2016   Procedure: HOT HEMOSTASIS (ARGON PLASMA COAGULATION/BICAP);  Surgeon: Laurence Spates, MD;  Location: [facility] ENDOSCOPY;  Service: Endoscopy;  Laterality: N/A;  . IMPLANTABLE CARDIOVERTER DEFIBRILLATOR (ICD) GENERATOR CHANGE N/A 12/16/2012   Procedure: ICD GENERATOR CHANGE;  Surgeon: Deboraha Sprang, MD;  Location: [facility] CATH LAB;  Service: Cardiovascular;  Laterality: N/A;  . INSERTION OF IMPLANTABLE LEFT VENTRICULAR ASSIST DEVICE N/A 05/30/2016   Procedure: INSERTION OF IMPLANTABLE LEFT VENTRICULAR ASSIST DEVICE;  Surgeon: Ivin Poot, MD;  Location: [place];  Service: Open Heart Surgery;  Laterality: N/A;  CIRC ARREST  NITRIC OXIDE  . INTRAOPERATIVE ARTERIOGRAM Right 11/07/2013   Procedure: INTRA OPERATIVE ARTERIOGRAM;  Surgeon: Angelia Mould, MD;  Location: [place];  Service: Vascular;  Laterality: Right;  . LOWER EXTREMITY ANGIOGRAM Bilateral 11/06/2013   Procedure: LOWER EXTREMITY ANGIOGRAM;  Surgeon: Angelia Mould, MD;  Location: [facility] CATH LAB;  Service: Cardiovascular;  Laterality: Bilateral;  . Medtronic Virtuoso II DR dual-chamber cardioverter-defibrillation with pocket revison     Dr. Virl Axe  . MULTIPLE EXTRACTIONS WITH ALVEOLOPLASTY N/A 04/26/2016   Procedure: Extraction of tooth #'s 5-14 and 20-30 with alveoloplasty;  Surgeon: Lenn Cal, DDS;   Location: [place];  Service: Oral Surgery;  Laterality: N/A;  . PERIPHERAL VASCULAR CATHETERIZATION N/A 09/25/2014   Procedure: Abdominal Aortogram;  Surgeon: Elam Dutch, MD;  Location: [place] CV LAB;  Service: Cardiovascular;  Laterality: N/A;  . REMOVAL OF PLEURAL DRAINAGE CATHETER Right 06/13/2016   Procedure: REMOVAL OF PLEURAL DRAINAGE CATHETER;  Surgeon: Ivin Poot, MD;  Location: [place];  Service: Thoracic;  Laterality: Right;  . RIGHT HEART CATH N/A 05/01/2016   Procedure: Right Heart Cath;  Surgeon: Larey Dresser, MD;  Location: [place] CV LAB;  Service: Cardiovascular;  Laterality: N/A;  . RIGHT HEART CATH N/A 05/25/2016   Procedure: Right Heart Cath;  Surgeon: Larey Dresser, MD;  Location: [place] CV LAB;  Service: Cardiovascular;  Laterality: N/A;  . TEE WITHOUT CARDIOVERSION N/A 05/30/2016   Procedure: TRANSESOPHAGEAL ECHOCARDIOGRAM (TEE);  Surgeon: Prescott Gum, Collier Salina, MD;  Location: [place];  Service: Open Heart Surgery;  Laterality: N/A;  . US GUIDED THORACENTESIS RIGHT ([place] HX) Right 01/05/2016    Prior to Admission medications   Medication Sig Start Date End Date Taking? Authorizing Provider  atorvastatin (LIPITOR) 40 MG tablet Take 1 tablet (40 mg total) by mouth daily. 02/19/17  Yes Larey Dresser, MD  danazol (DANOCRINE) 100 MG  capsule Take 1 capsule (100 mg total) by mouth 2 (two) times daily. 03/15/17  Yes Larey Dresser, MD  ezetimibe (ZETIA) 10 MG tablet Take 1 tablet (10 mg total) by mouth daily. 01/08/17  Yes Larey Dresser, MD  Ferrous Sulfate (IRON) 325 (65 Fe) MG TABS Take 1 tablet (325 mg total) by mouth 3 (three) times daily. Patient taking differently: Take 1 each by mouth 2 (two) times daily.  03/15/17  Yes Larey Dresser, MD  insulin NPH-regular Human (NOVOLIN 70/30) (70-30) 100 UNIT/ML injection Inject 12 Units into the skin 3 (three) times daily.    Yes [provider]  losartan (COZAAR) 25 MG tablet Take 1 tablet (25 mg total)  by mouth 2 (two) times daily. 01/22/17  Yes Bensimhon, Shaune Pascal, MD  pantoprazole (PROTONIX) 40 MG tablet Take 1 tablet (40 mg total) by mouth 2 (two) times daily. 03/15/17  Yes Larey Dresser, MD  potassium chloride SA (K-DUR,KLOR-CON) 20 MEQ tablet Take 2 tablets (40 mEq total) by mouth 2 (two) times daily. Patient taking differently: Take 40 mEq by mouth 2 (two) times daily. Patient taking one pill twice daily; "difficult to swallow" 08/04/16  Yes Larey Dresser, MD  sildenafil (REVATIO) 20 MG tablet Take 1 tablet (20 mg total) by mouth 3 (three) times daily. Patient taking differently: Take 20 mg by mouth 2 (two) times daily.  05/02/16  Yes Larey Dresser, MD  torsemide (DEMADEX) 20 MG tablet Take 3 tablets (60 mg total) by mouth 2 (two) times daily. 02/19/17  Yes Larey Dresser, MD  warfarin (COUMADIN) 5 MG tablet Take 1 tablet (5 mg total) by mouth daily. 02/19/17  Yes Larey Dresser, MD  blood glucose meter kit and supplies Ultra Blue Kit Dispense based on patient and insurance preference. Use up to four times daily as directed. (FOR ICD-9 250.00, 250.01). Patient not taking: Reported on 04/11/2017 08/04/16   Larey Dresser, MD    Current Facility-Administered Medications  Medication Dose Route Frequency Provider Last Rate Last Dose  . acetaminophen (TYLENOL) tablet 650 mg  650 mg Oral Q4H PRN Bensimhon, Shaune Pascal, MD      . atorvastatin (LIPITOR) tablet 40 mg  40 mg Oral Daily Bensimhon, Shaune Pascal, MD   40 mg at 04/12/17 0933  . danazol (DANOCRINE) capsule 100 mg  100 mg Oral BID Bensimhon, Shaune Pascal, MD      . ezetimibe (ZETIA) tablet 10 mg  10 mg Oral Daily Bensimhon, Shaune Pascal, MD   10 mg at 04/12/17 0933  . fentaNYL (SUBLIMAZE) injection 50 mcg  50 mcg Intravenous Q2H PRN Bensimhon, Shaune Pascal, MD      . ferrous sulfate tablet 325 mg  325 mg Oral BID Bensimhon, Shaune Pascal, MD   325 mg at 04/12/17 0933  . hydrALAZINE (APRESOLINE) injection 10 mg  10 mg Intravenous Q4H PRN Bensimhon, Shaune Pascal, MD      . insulin aspart protamine- aspart (NOVOLOG MIX 70/30) injection 12 Units  12 Units Subcutaneous BID WC Bensimhon, Shaune Pascal, MD   12 Units at 04/12/17 0934  . ondansetron (ZOFRAN) injection 4 mg  4 mg Intravenous Q6H PRN Bensimhon, Shaune Pascal, MD      . oxyCODONE-acetaminophen (PERCOCET/ROXICET) 5-325 MG per tablet 1-2 tablet  1-2 tablet Oral Q4H PRN Bensimhon, Shaune Pascal, MD   2 tablet at 04/12/17 0612  . pantoprazole (PROTONIX) EC tablet 40 mg  40 mg Oral BID Bensimhon, Shaune Pascal, MD  40 mg at 04/12/17 0933  . sildenafil (REVATIO) tablet 20 mg  20 mg Oral BID Bensimhon, Shaune Pascal, MD   20 mg at 04/12/17 0933  . Warfarin - Pharmacist Dosing Inpatient   Does not apply q1800 Bensimhon, Shaune Pascal, MD        Allergies as of 04/11/2017 - Review Complete 04/11/2017  Allergen Reaction Noted  . Metformin and related Nausea And Vomiting 04/21/2014  . Niacin and related Other (See Comments) 05/25/2016    Family History  Problem Relation Age of Onset  . Diabetes Mother   . Coronary artery disease Father   . Heart disease Father        before age 47    Social History   Socioeconomic History  . Marital status: Divorced    Spouse name: Not on file  . Number of children: 2  . Years of education: Not on file  . Highest education level: Not on file  Occupational History  . 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 Financial resource strain: Not on file  . Food insecurity:    Worry: Not on file    Inability: Not on file  . Transportation needs:    Medical: Not on file    Non-medical: Not on file  Tobacco Use  . Smoking status: Current Every Day Smoker    Packs/day: 0.50    Years: 45.00    Pack years: 22.50    Types: Cigarettes  . Smokeless tobacco: Never Used  . Tobacco comment: pt reports he is stress smoker  Substance and Sexual Activity  . Alcohol use: No    Alcohol/week: 0.0 oz  . Drug use: No    Comment: former  Cocain, Acid, Marijuana - 25 years ago  . Sexual activity: Never  Lifestyle  . Physical activity:    Days per week: Not on file    Minutes per session: Not on file  . Stress: Not on file  Relationships  . Social connections:    Talks on phone: Not on file    Gets together: Not on file    Attends religious service: Not on file    Active member of club or organization: Not on file    Attends meetings of clubs or organizations: Not on file    Relationship status: Not on file  . Intimate partner violence:    Fear of current or ex partner: Not on file    Emotionally abused: Not on file    Physically abused: Not on file    Forced sexual activity: Not on file  Other Topics Concern  . Not on file  Social History Narrative   Divorced. 2 children.   Previously worked at [employer].    Review of Systems: Positive for: GI: Described in detail in HPI.    Gen: Denies any fever, chills, rigors, night sweats, anorexia, fatigue, weakness, malaise, involuntary weight loss, and sleep disorder CV: syncope,Denies chest pain, angina, palpitations,  orthopnea, PND, peripheral edema, and claudication. Resp: Denies dyspnea, cough, sputum, wheezing, coughing up blood. GU : Denies urinary burning, blood in urine, urinary frequency, urinary hesitancy, nocturnal urination, and urinary incontinence. MS: Back pain, Denies joint pain or swelling.  Denies muscle weakness, cramps, atrophy.  Derm: Denies rash, itching, oral ulcerations, hives, unhealing ulcers.  Psych: Denies depression, anxiety, memory loss, suicidal ideation, hallucinations,  and confusion. Heme: Denies bruising, bleeding, and enlarged lymph nodes. Neuro:  Denies any headaches, dizziness, paresthesias. Endo:  DM, thyroid,Denies any problems with , adrenal function.  Physical Exam: Vital signs in last 24 hours: Temp:  [98.4 F (36.9 C)-98.8 F (37.1 C)] 98.6 F (37 C) (04/11 0800) Pulse Rate:  [44-93] 82 (04/11 0800) Resp:  [13-22] 14  (04/11 0800) BP: (103-130)/(0-92) 104/81 (04/11 0800) SpO2:  [98 %-100 %] 100 % (04/11 0800) Weight:  [105.3 kg (232 lb 2.3 oz)-105.7 kg (233 lb 0.4 oz)] 105.3 kg (232 lb 2.3 oz) (04/11 0618) Last BM Date: 04/11/17  General:   Alert,  Well-developed, well-nourished, pleasant and cooperative in NAD Head:  Normocephalic and atraumatic. Eyes:  Sclera clear, no icterus.   Conjunctiva pink. Ears:  Normal auditory acuity. Nose:  No deformity, discharge,  or lesions. Mouth:  No deformity or lesions.  Oropharynx pink & moist. Neck:  Supple; no masses or thyromegaly. Lungs:  Clear throughout to auscultation.   No wheezes, crackles, or rhonchi. No acute distress. Heart:  Regular rate and rhythm Extremities:  Without clubbing or edema,chronic  skin changes in bilateral lower extremities Neurologic:  Alert and  oriented x4;  grossly normal neurologically. Skin:  Intact without significant lesions or rashes. Psych:  Alert and cooperative. Normal mood and affect. Abdomen:  Soft, nontender and nondistended. No masses, hepatosplenomegaly or hernias noted. Normal bowel sounds, without guarding, and without rebound.         Lab Results: Recent Labs    04/11/17 1941 04/12/17 0234  WBC 10.2 9.5  HGB 14.3 13.6  HCT 43.3 42.1  PLT 147* 150   BMET Recent Labs    04/11/17 1941 04/12/17 0234  NA 137 138  K 4.0 3.9  CL 98* 99*  CO2 24 27  GLUCOSE 175* 168*  BUN 14 13  CREATININE 1.40* 1.35*  CALCIUM 8.9 8.8*   LFT No results for input(s): PROT, ALBUMIN, AST, ALT, ALKPHOS, BILITOT, BILIDIR, IBILI in the last 72 hours. PT/INR Recent Labs    04/11/17 1941 04/12/17 0234  LABPROT 31.1* 31.0*  INR 3.02 3.02    Studies/Results: Dg Chest 1 View  Result Date: 04/11/2017 CLINICAL DATA:  Low back pain after fall EXAM: CHEST  1 VIEW COMPARISON:  08/10/2016 FINDINGS: Post sternotomy changes. Left-sided pacing device as before. LVAD similar in position. Cardiomegaly with mild vascular  congestion. No acute airspace disease, pleural effusion or pneumothorax. Aortic atherosclerosis. Old right-sided rib fractures. Probable zipper artifact at the thoracic inlet. IMPRESSION: No active disease.  Cardiomegaly with vascular congestion. Electronically Signed   By: Donavan Foil M.D.   On: 04/11/2017 19:23   Dg Lumbar Spine Complete  Result Date: 04/11/2017 CLINICAL DATA:  Low back pain after fall EXAM: LUMBAR SPINE - COMPLETE 4+ VIEW COMPARISON:  CT 04/03/2013, 05/11/2016 FINDINGS: Grade 1 anterolisthesis of L5 on S1 with chronic bilateral pars defect at L5. Age indeterminate mild superior endplate deformity at L1. Remaining vertebral body heights are normal. Aortic atherosclerosis. Moderate degenerative changes at L5-S1. IMPRESSION: 1. Age indeterminate mild superior endplate deformity at L1 2. Grade 1 anterolisthesis of L5 on S1 with chronic bilateral pars defect at L5 Electronically Signed   By: Donavan Foil M.D.   On: 04/11/2017 19:21   Ct Head Wo Contrast  Result Date: 04/11/2017 CLINICAL DATA:  [place] today with altered mental status. Left ventricular assist device patient. EXAM: CT HEAD WITHOUT CONTRAST TECHNIQUE: Contiguous axial images were obtained from the base of the skull through the vertex without intravenous contrast. COMPARISON:  None. FINDINGS: Brain: The  brain shows a normal appearance without evidence of malformation, accelerated atrophy, old or acute small or large vessel infarction, mass lesion, hemorrhage, hydrocephalus or extra-axial collection. Vascular: There is atherosclerotic calcification of the major vessels at the base of the brain. Skull: Normal.  No traumatic finding.  No focal bone lesion. Sinuses/Orbits: Sinuses are clear. Orbits appear normal. Mastoids are clear. Other: None significant IMPRESSION: No acute finding. No cause of the presenting symptoms is identified. Electronically Signed   By: Nelson Chimes M.D.   On: 04/11/2017 19:25   Ct Lumbar Spine Wo  Contrast  Result Date: 04/11/2017 CLINICAL DATA:  [place] this morning. Low back pain. History of LEFT ventricular assist device. EXAM: CT LUMBAR SPINE WITHOUT CONTRAST TECHNIQUE: Multidetector CT imaging of the lumbar spine was performed without intravenous contrast administration. Multiplanar CT image reconstructions were also generated. COMPARISON:  Lumbar spine radiographs April 11, 2017 and CT abdomen and pelvis May 11, 2016 FINDINGS: SEGMENTATION: For the purposes of this report the last well-formed intervertebral disc space is reported as L5-S1. ALIGNMENT: Maintained lumbar lordosis. Grade 1 L5-S1 anterolisthesis with chronic bilateral L5 pars interarticularis defects. VERTEBRAE: Acute mild L1 superior endplate burst fracture with less than 25% height loss, 1-2 mm retropulsed bony fragments. Remaining lumbar vertebral bodies intact. Scattered Schmorl's nodes. Osteopenia without destructive bony lesions. PARASPINAL AND OTHER SOFT TISSUES: Small RIGHT pleural effusion. Moderate calcific atherosclerosis. DISC LEVELS: T11-12: Small broad-based disc osteophyte complex without canal stenosis or neural foraminal narrowing. T12-L1, L1-2: Small broad-based disc osteophyte complex. Mild facet arthropathy and ligamentum flavum redundancy without canal stenosis or neural foraminal narrowing. L2-3: Small broad-based disc bulge. Minimal prevertebral soft tissue at L2 suggesting disc extrusion or blood products (series 4, image 58). No canal stenosis or neural foraminal narrowing. L3-4: Small broad-based disc bulge. Mild facet arthropathy and ligamentum flavum redundancy without canal stenosis. Mild LEFT neural foraminal narrowing. L4-5: Small broad-based and central disc protrusion. Mild facet arthropathy and ligamentum flavum redundancy without canal stenosis. Mild bilateral neural foraminal narrowing. L5-S1: Anterolisthesis. Unroofing of disc osteophyte complex. Mild facet arthropathy without canal stenosis. Severe  bilateral neural foraminal narrowing. IMPRESSION: 1. Acute mild L1 burst fracture. Small ventral epidural L2 hemorrhage versus disc extrusion. 2. Grade 1 L5-S1 anterolisthesis on the basis of chronic L5 pars interarticularis defects. 3. No canal stenosis. Neural foraminal narrowing L3-4 through L5-S1: Severe at L5-S1. 4. Small RIGHT pleural effusion. Aortic Atherosclerosis (ICD10-I70.0). Electronically Signed   By: Elon Alas M.D.   On: 04/11/2017 22:49    Impression: 1. Syncope, likely related to vasovagal event?probably precipitated by dry heaves. 2. Acute L1 burst fracture 3. Mild generalized abdominal pain 4. Elevated PT/INR of 31/3.02 5. History of ischemic cardiomyopathy, HVAD in place 6. Mild renal impairment 7. Short segment Barrett's esophagus, gastric AVMs status post APC on 07/20/16  Plan: 1.Started on Protonix 40 mg twice a day, advise to continue the same. 2.Will add Carafate1 g by mouth twice a day as patient complains of bile reflux. 3. Ultrasound of the abdomen?gallstones,(LFTs were unremarkable on 08/13/16), he complains of mild generalized abdominal pain not typical of biliary colic. 4.Able to tolerate diet and has not had any episodes of nausea / vomiting. Would advise minimizing narcotics as it may precipitate or worsen nausea.  Patient may have gastroparesis from uncontrolled diabetes/autonomic neuropathy, will benefit from low residue, low fiber diet and small frequent meals.   LOS: 1 day   Ronnette Juniper, M.D.  04/12/2017, 11:41 AM  Pager [phone] If no answer or after  5 PM call [phone]

## 2017-04-12 NOTE — Progress Notes (Signed)
  Echocardiogram 2D Echocardiogram has been performed.  Celene SkeenVijay  Tanya Crothers 04/12/2017, 12:08 PM

## 2017-04-13 ENCOUNTER — Inpatient Hospital Stay (HOSPITAL_COMMUNITY): Payer: BLUE CROSS/BLUE SHIELD

## 2017-04-13 LAB — CBC
HCT: 40.5 % (ref 39.0–52.0)
Hemoglobin: 12.9 g/dL — ABNORMAL LOW (ref 13.0–17.0)
MCH: 27.4 pg (ref 26.0–34.0)
MCHC: 31.9 g/dL (ref 30.0–36.0)
MCV: 86 fL (ref 78.0–100.0)
PLATELETS: 142 10*3/uL — AB (ref 150–400)
RBC: 4.71 MIL/uL (ref 4.22–5.81)
RDW: 16.6 % — ABNORMAL HIGH (ref 11.5–15.5)
WBC: 8.1 10*3/uL (ref 4.0–10.5)

## 2017-04-13 LAB — GLUCOSE, CAPILLARY
GLUCOSE-CAPILLARY: 110 mg/dL — AB (ref 65–99)
GLUCOSE-CAPILLARY: 112 mg/dL — AB (ref 65–99)
Glucose-Capillary: 140 mg/dL — ABNORMAL HIGH (ref 65–99)
Glucose-Capillary: 115 mg/dL — ABNORMAL HIGH (ref 65–99)

## 2017-04-13 LAB — ECHOCARDIOGRAM LIMITED
HEIGHTINCHES: 72 in
WEIGHTICAEL: 3714.31 [oz_av]

## 2017-04-13 LAB — BASIC METABOLIC PANEL
ANION GAP: 11 (ref 5–15)
BUN: 11 mg/dL (ref 6–20)
CALCIUM: 8.7 mg/dL — AB (ref 8.9–10.3)
CO2: 29 mmol/L (ref 22–32)
Chloride: 97 mmol/L — ABNORMAL LOW (ref 101–111)
Creatinine, Ser: 1.17 mg/dL (ref 0.61–1.24)
GLUCOSE: 90 mg/dL (ref 65–99)
POTASSIUM: 3.3 mmol/L — AB (ref 3.5–5.1)
Sodium: 137 mmol/L (ref 135–145)

## 2017-04-13 LAB — LACTATE DEHYDROGENASE: LDH: 200 U/L — AB (ref 98–192)

## 2017-04-13 LAB — PROTIME-INR
INR: 2.99
Prothrombin Time: 30.8 seconds — ABNORMAL HIGH (ref 11.4–15.2)

## 2017-04-13 MED ORDER — POTASSIUM CHLORIDE CRYS ER 20 MEQ PO TBCR
40.0000 meq | EXTENDED_RELEASE_TABLET | Freq: Once | ORAL | Status: AC
  Administered 2017-04-13: 40 meq via ORAL
  Filled 2017-04-13: qty 2

## 2017-04-13 MED ORDER — POTASSIUM CHLORIDE CRYS ER 20 MEQ PO TBCR: 40.0000 meq | EXTENDED_RELEASE_TABLET | Freq: Once | ORAL | Status: DC

## 2017-04-13 MED ORDER — LOSARTAN POTASSIUM 50 MG PO TABS
50.0000 mg | ORAL_TABLET | Freq: Two times a day (BID) | ORAL | Status: DC
  Administered 2017-04-13 – 2017-04-16 (×8): 50 mg via ORAL
  Filled 2017-04-13 (×8): qty 1

## 2017-04-13 MED ORDER — SUCRALFATE 1 G PO TABS
1.0000 g | ORAL_TABLET | Freq: Three times a day (TID) | ORAL | Status: DC
  Administered 2017-04-13 – 2017-04-16 (×13): 1 g via ORAL
  Filled 2017-04-13 (×13): qty 1

## 2017-04-13 MED ORDER — DANAZOL 100 MG PO CAPS
100.0000 mg | ORAL_CAPSULE | Freq: Two times a day (BID) | ORAL | Status: DC
  Administered 2017-04-13 – 2017-04-16 (×6): 100 mg via ORAL
  Filled 2017-04-13 (×8): qty 1

## 2017-04-13 MED ORDER — TRAMADOL HCL 50 MG PO TABS
50.0000 mg | ORAL_TABLET | Freq: Four times a day (QID) | ORAL | Status: DC
  Administered 2017-04-13 – 2017-04-16 (×15): 50 mg via ORAL
  Filled 2017-04-13 (×15): qty 1

## 2017-04-13 MED ORDER — METOCLOPRAMIDE HCL 5 MG/ML IJ SOLN
5.0000 mg | Freq: Three times a day (TID) | INTRAMUSCULAR | Status: DC
  Administered 2017-04-13 – 2017-04-16 (×11): 5 mg via INTRAVENOUS
  Filled 2017-04-13 (×11): qty 2

## 2017-04-13 MED ORDER — METOCLOPRAMIDE HCL 10 MG PO TABS: 5.0000 mg | ORAL_TABLET | Freq: Three times a day (TID) | ORAL | Status: DC

## 2017-04-13 NOTE — Progress Notes (Signed)
LVAD Coordinator Rounding Note:  Admitted 04/11/17 to Dr. Shirlee LatchMcLean due to recurrent syncope.  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 Maren BeachVanTrigt under Destination Therapy criteria due to current smoking status.  Pt was lying in bed. Pt states that he is nauseasous and vomited this morning. Pt continues to worry about how he will wear the back brace with his LVAD drive line and equipment.   Vital signs: Temp: 98.1 HR: 84 Doppler Pressure: 86 Automatic BP:  112/78 (90) O2 Sat: 97% RA Wt: 233>232 lbs  HVAD Interrogation Reveals: Speed: 2700 Flow: 4.1 Power: 4.6w Peak/Trough: 6.3/2.3 Flow alarm limit: 1.5 Power alarm limit: 6.5 Hematocrit: 41 Suction alarm: on [place] cycle: on  Drive Line:  Left abdominal sorbaview dressing dry and intact; anchor intact and accurately applied. Weekly dressings; bedside RN may change dressing has needed. Due on Monday 4/15.  Labs:  LDH trend: 248>217>200  INR trend: 3.0>2.99  Anticoagulation Plan: -INR Goal: 2.0 - 2.5 -ASA Dose: no ASA due to hx of GI bleed  Device: - St Jude single lead -Therapies: on at 200 bpm  Adverse Events on VAD: 05/31/16 - Controller power-up associated with pump start event. Controller change out performed.  06/01/16 - Controller pins lubricated per St Jude reps  06/17/16 - Controller power-up associated with pump start event while changing power source 06/22/16 - Controller pins re-lubricated per [employer] reps    Plan/Recommendations:   1. Continue current treatment plan. 2. Please call VAD pager with any VAD equipment or drive line issues  Carlton AdamSarah Herbert RN, VAD Coordinator 24/7 VAD Pager: [phone]

## 2017-04-13 NOTE — Progress Notes (Signed)
Pt has experienced two sets of brief vent bigeminy.   [place]  Ellyce Lafevers, RN

## 2017-04-13 NOTE — Progress Notes (Signed)
ANTICOAGULATION CONSULT NOTE - Follow Up Consult  Pharmacy Consult for warfarin Indication: LVAD  Allergies  Allergen Reactions  . Metformin And Related Nausea And Vomiting    *Only the extended release* (does this)  . Niacin And Related Other (See Comments)    REACTION IS SIDE EFFECT "SEVERE" FLUSHING    Patient Measurements: Height: 6' (182.9 cm) Weight: 232 lb 3.2 oz (105.3 kg) IBW/kg (Calculated) : 77.6   Vital Signs: Temp: 98.9 F (37.2 C) (04/12 0347) Temp Source: Oral (04/12 0347) BP: 115/98 (04/12 0400) Pulse Rate: 75 (04/11 2322)  Labs: Recent Labs    04/11/17 1941 04/12/17 0234 04/13/17 0257  HGB 14.3 13.6 12.9*  HCT 43.3 42.1 40.5  PLT 147* 150 142*  LABPROT 31.1* 31.0* 30.8*  INR 3.02 3.02 2.99  CREATININE 1.40* 1.35* 1.17    Estimated Creatinine Clearance: 82.1 mL/min (by C-G formula based on SCr of 1.17 mg/dL).   Assessment: 62 yo male admitted through ED with syncope and fall. Had LVAD placed 5/18- past cardiac history CAD and CHF. Pharmacy consulted for warfarin dosing. Patient has hx of upper/lower GI bleed and has INR goal of 2-2.5. Warfarin dose held yesterday and day before d/t INR of 3.02. INR today 2.99. Hgb 12.9, Plt 142. No bleeding reported   Goal of Therapy:  INR 2.0-2.5 Monitor platelets by anticoagulation protocol: Yes   Plan:  Hold warfarin X1 Daily INR/CBC Monitor s/sx of bleeding  [name], PharmD Candidate 04/13/2017,7:59 AM

## 2017-04-13 NOTE — Progress Notes (Signed)
PT Cancellation Note  Patient Details Name: Dalton RainwaterLarry D [name] MRN: [id number] DOB: 1955-03-24   Cancelled Treatment:    Reason Eval/Treat Not Completed: Other (comment). Pt nauseous. Pt with TLSO present in room. Unsure of how TLSO will impact pt's HVAD driveline. Will have someone check on pt later.    Angelina OkCary W [facility] 04/13/2017, 9:38 AM Skip Mayerary Aylah Yeary PT [phone]

## 2017-04-13 NOTE — Progress Notes (Addendum)
Subjective: The patient was seen and examined at bedside. He reports mild improvement in nausea but continues to have decreased appetite, mild generalized and mostly mid abdominal pain. He has not had a bowel movement in 2 days, which is not unusual for him, as he fluctuates from constipation to diarrhea.  Objective: Vital signs in last 24 hours: Temp:  [98.1 F (36.7 C)-98.9 F (37.2 C)] 98.1 F (36.7 C) (04/12 1206) Pulse Rate:  [69-81] 69 (04/12 1206) Resp:  [12-17] 12 (04/12 1206) BP: (92-115)/(75-98) 112/78 (04/12 0805) SpO2:  [97 %-99 %] 98 % (04/12 1206) Weight:  [105.3 kg (232 lb 3.2 oz)] 105.3 kg (232 lb 3.2 oz) (04/12 0550) Weight change: -0.375 kg (-13.2 oz) Last BM Date: 04/11/17  HY:WVPXT on bed and does not appear to be in acute distress GENERAL:no pallor, no icterus ABDOMEN:soft, nontender, nondistended EXTREMITIES:no edema  Lab Results: Results for orders placed or performed during the hospital encounter of 04/11/17 (from the past 48 hour(s))  CBC with Differential     Status: Abnormal   Collection Time: 04/11/17  7:41 PM  Result Value Ref Range   WBC 10.2 4.0 - 10.5 K/uL   RBC 5.17 4.22 - 5.81 MIL/uL   Hemoglobin 14.3 13.0 - 17.0 g/dL   HCT 43.3 39.0 - 52.0 %   MCV 83.8 78.0 - 100.0 fL   MCH 27.7 26.0 - 34.0 pg   MCHC 33.0 30.0 - 36.0 g/dL   RDW 16.4 (H) 11.5 - 15.5 %   Platelets 147 (L) 150 - 400 K/uL   Neutrophils Relative % 90 %   Neutro Abs 9.2 (H) 1.7 - 7.7 K/uL   Lymphocytes Relative 7 %   Lymphs Abs 0.7 0.7 - 4.0 K/uL   Monocytes Relative 3 %   Monocytes Absolute 0.3 0.1 - 1.0 K/uL   Eosinophils Relative 0 %   Eosinophils Absolute 0.0 0.0 - 0.7 K/uL   Basophils Relative 0 %   Basophils Absolute 0.0 0.0 - 0.1 K/uL    Comment: Performed at [facility], [address], [place], [place] [address]  Basic metabolic panel     Status: Abnormal   Collection Time: 04/11/17  7:41 PM  Result Value Ref Range   Sodium 137 135 - 145 mmol/L    Potassium 4.0 3.5 - 5.1 mmol/L   Chloride 98 (L) 101 - 111 mmol/L   CO2 24 22 - 32 mmol/L   Glucose, Bld 175 (H) 65 - 99 mg/dL   BUN 14 6 - 20 mg/dL   Creatinine, Ser 1.40 (H) 0.61 - 1.24 mg/dL   Calcium 8.9 8.9 - 10.3 mg/dL   GFR calc non Af Amer 52 (L) >60 mL/min   GFR calc Af Amer >60 >60 mL/min    Comment: (NOTE) The eGFR has been calculated using the CKD EPI equation. This calculation has not been validated in all clinical situations. eGFR's persistently <60 mL/min signify possible Chronic Kidney Disease.    Anion gap 15 5 - 15    Comment: Performed at [facility] [address] [place], [place] [address]  Protime-INR     Status: Abnormal   Collection Time: 04/11/17  7:41 PM  Result Value Ref Range   Prothrombin Time 31.1 (H) 11.4 - 15.2 seconds   INR 3.02     Comment: Performed at [facility] [address] [place], [place] [address]  Lactate dehydrogenase     Status: Abnormal   Collection Time: 04/11/17  7:41 PM  Result Value Ref Range   LDH 248 (H) 98 - 192 U/L    Comment: Performed at [facility], [place] [address], [place], [place] [address]  Glucose, capillary     Status: Abnormal   Collection Time: 04/12/17 12:39 AM  Result Value Ref Range   Glucose-Capillary 145 (H) 65 - 99 mg/dL   Comment 1 Notify RN    Comment 2 Document in Chart   Protime-INR     Status: Abnormal   Collection Time: 04/12/17  2:34 AM  Result Value Ref Range   Prothrombin Time 31.0 (H) 11.4 - 15.2 seconds   INR 3.02     Comment: Performed at [facility], [place] [address] [place], [place] [address]  HIV antibody (Routine Testing)     Status: None   Collection Time: 04/12/17  2:34 AM  Result Value Ref Range   HIV Screen 4th Generation wRfx Non Reactive Non Reactive    Comment: (NOTE) Performed At: [facility] [address] [place], [place] [id number] Rush Farmer MD [id number] Performed at [facility], [place] [address], [place],  [place] [address]   Lactate dehydrogenase     Status: Abnormal   Collection Time: 04/12/17  2:34 AM  Result Value Ref Range   LDH 217 (H) 98 - 192 U/L    Comment: Performed at [facility], [place] [address] [place], [place] [address]  CBC     Status: Abnormal   Collection Time: 04/12/17  2:34 AM  Result Value Ref Range   WBC 9.5 4.0 - 10.5 K/uL   RBC 4.96 4.22 - 5.81 MIL/uL   Hemoglobin 13.6 13.0 - 17.0 g/dL   HCT 42.1 39.0 - 52.0 %   MCV 84.9 78.0 - 100.0 fL   MCH 27.4 26.0 - 34.0 pg   MCHC 32.3 30.0 - 36.0 g/dL   RDW 16.6 (H) 11.5 - 15.5 %   Platelets 150 150 - 400 K/uL    Comment: Performed at [facility], [place] [address], [place], [place] [address]  Basic metabolic panel     Status: Abnormal   Collection Time: 04/12/17  2:34 AM  Result Value Ref Range   Sodium 138 135 - 145 mmol/L   Potassium 3.9 3.5 - 5.1 mmol/L   Chloride 99 (L) 101 - 111 mmol/L   CO2 27 22 - 32 mmol/L   Glucose, Bld 168 (H) 65 - 99 mg/dL   BUN 13 6 - 20 mg/dL   Creatinine, Ser 1.35 (H) 0.61 - 1.24 mg/dL   Calcium 8.8 (L) 8.9 - 10.3 mg/dL   GFR calc non Af Amer 55 (L) >60 mL/min   GFR calc Af Amer >60 >60 mL/min    Comment: (NOTE) The eGFR has been calculated using the CKD EPI equation. This calculation has not been validated in all clinical situations. eGFR's persistently <60 mL/min signify possible Chronic Kidney Disease.    Anion gap 12 5 - 15    Comment: Performed at [facility] [address], [place], [place] [address]  MRSA PCR Screening     Status: None   Collection Time: 04/12/17  5:43 AM  Result Value Ref Range   MRSA by PCR NEGATIVE NEGATIVE    Comment:        The GeneXpert MRSA Assay (FDA approved for NASAL specimens only), is one component of a comprehensive MRSA colonization surveillance program. It is not intended to diagnose  MRSA infection nor to guide or monitor treatment for MRSA infections. Performed at [facility], [place] [address] [place], [place]  [address]   Glucose, capillary     Status: Abnormal   Collection Time: 04/12/17  8:18 AM  Result Value Ref Range   Glucose-Capillary 262 (H) 65 - 99 mg/dL   Comment 1 Notify RN    Comment 2 Document in Chart   Glucose, capillary     Status: Abnormal   Collection Time: 04/12/17 12:56 PM  Result Value Ref Range   Glucose-Capillary 216 (H) 65 - 99 mg/dL   Comment 1 Notify RN    Comment 2 Document in Chart   Glucose, capillary     Status: Abnormal   Collection Time: 04/12/17  5:25 PM  Result Value Ref Range   Glucose-Capillary 231 (H) 65 - 99 mg/dL   Comment 1 Notify RN    Comment 2 Document in Chart   Glucose, capillary     Status: Abnormal   Collection Time: 04/12/17  9:26 PM  Result Value Ref Range   Glucose-Capillary 132 (H) 65 - 99 mg/dL   Comment 1 Notify RN    Comment 2 Document in Chart   Lactate dehydrogenase     Status: Abnormal   Collection Time: 04/13/17  2:57 AM  Result Value Ref Range   LDH 200 (H) 98 - 192 U/L    Comment: Performed at [facility], [place] [address] [place], [place] [address]  Protime-INR     Status: Abnormal   Collection Time: 04/13/17  2:57 AM  Result Value Ref Range   Prothrombin Time 30.8 (H) 11.4 - 15.2 seconds   INR 2.99     Comment: Performed at [facility] [address] [place], [place] [address]  CBC     Status: Abnormal   Collection Time: 04/13/17  2:57 AM  Result Value Ref Range   WBC 8.1 4.0 - 10.5 K/uL   RBC 4.71 4.22 - 5.81 MIL/uL   Hemoglobin 12.9 (L) 13.0 - 17.0 g/dL   HCT 40.5 39.0 - 52.0 %   MCV 86.0 78.0 - 100.0 fL   MCH 27.4 26.0 - 34.0 pg   MCHC 31.9 30.0 - 36.0 g/dL   RDW 16.6 (H) 11.5 - 15.5 %   Platelets 142 (L) 150 - 400 K/uL    Comment: Performed at [facility], [place] [address] [place], [place] [address]  Basic metabolic panel     Status: Abnormal   Collection Time: 04/13/17  2:57 AM  Result Value Ref Range   Sodium 137 135 - 145 mmol/L   Potassium 3.3 (L) 3.5 - 5.1 mmol/L   Chloride 97 (L)  101 - 111 mmol/L   CO2 29 22 - 32 mmol/L   Glucose, Bld 90 65 - 99 mg/dL   BUN 11 6 - 20 mg/dL   Creatinine, Ser 1.17 0.61 - 1.24 mg/dL   Calcium 8.7 (L) 8.9 - 10.3 mg/dL   GFR calc non Af Amer >60 >60 mL/min   GFR calc Af Amer >60 >60 mL/min    Comment: (NOTE) The eGFR has been calculated using the CKD EPI equation. This calculation has not been validated in all clinical situations. eGFR's persistently <60 mL/min signify possible Chronic Kidney Disease.    Anion gap 11 5 - 15    Comment: Performed at [facility] [address] [place], [place] [address]  Glucose, capillary  Status: Abnormal   Collection Time: 04/13/17  8:25 AM  Result Value Ref Range   Glucose-Capillary 110 (H) 65 - 99 mg/dL   Comment 1 Notify RN    Comment 2 Document in Chart   Glucose, capillary     Status: Abnormal   Collection Time: 04/13/17 12:04 PM  Result Value Ref Range   Glucose-Capillary 112 (H) 65 - 99 mg/dL   Comment 1 Notify RN    Comment 2 Document in Chart     Studies/Results: Dg Chest 1 View  Result Date: 04/11/2017 CLINICAL DATA:  Low back pain after fall EXAM: CHEST  1 VIEW COMPARISON:  08/10/2016 FINDINGS: Post sternotomy changes. Left-sided pacing device as before. LVAD similar in position. Cardiomegaly with mild vascular congestion. No acute airspace disease, pleural effusion or pneumothorax. Aortic atherosclerosis. Old right-sided rib fractures. Probable zipper artifact at the thoracic inlet. IMPRESSION: No active disease.  Cardiomegaly with vascular congestion. Electronically Signed   By: Donavan Foil M.D.   On: 04/11/2017 19:23   Dg Lumbar Spine Complete  Result Date: 04/11/2017 CLINICAL DATA:  Low back pain after fall EXAM: LUMBAR SPINE - COMPLETE 4+ VIEW COMPARISON:  CT 04/03/2013, 05/11/2016 FINDINGS: Grade 1 anterolisthesis of L5 on S1 with chronic bilateral pars defect at L5. Age indeterminate mild superior endplate deformity at L1. Remaining vertebral body heights are  normal. Aortic atherosclerosis. Moderate degenerative changes at L5-S1. IMPRESSION: 1. Age indeterminate mild superior endplate deformity at L1 2. Grade 1 anterolisthesis of L5 on S1 with chronic bilateral pars defect at L5 Electronically Signed   By: Donavan Foil M.D.   On: 04/11/2017 19:21   Ct Head Wo Contrast  Result Date: 04/11/2017 CLINICAL DATA:  [place] today with altered mental status. Left ventricular assist device patient. EXAM: CT HEAD WITHOUT CONTRAST TECHNIQUE: Contiguous axial images were obtained from the base of the skull through the vertex without intravenous contrast. COMPARISON:  None. FINDINGS: Brain: The brain shows a normal appearance without evidence of malformation, accelerated atrophy, old or acute small or large vessel infarction, mass lesion, hemorrhage, hydrocephalus or extra-axial collection. Vascular: There is atherosclerotic calcification of the major vessels at the base of the brain. Skull: Normal.  No traumatic finding.  No focal bone lesion. Sinuses/Orbits: Sinuses are clear. Orbits appear normal. Mastoids are clear. Other: None significant IMPRESSION: No acute finding. No cause of the presenting symptoms is identified. Electronically Signed   By: Nelson Chimes M.D.   On: 04/11/2017 19:25   Ct Lumbar Spine Wo Contrast  Result Date: 04/11/2017 CLINICAL DATA:  [place] this morning. Low back pain. History of LEFT ventricular assist device. EXAM: CT LUMBAR SPINE WITHOUT CONTRAST TECHNIQUE: Multidetector CT imaging of the lumbar spine was performed without intravenous contrast administration. Multiplanar CT image reconstructions were also generated. COMPARISON:  Lumbar spine radiographs April 11, 2017 and CT abdomen and pelvis May 11, 2016 FINDINGS: SEGMENTATION: For the purposes of this report the last well-formed intervertebral disc space is reported as L5-S1. ALIGNMENT: Maintained lumbar lordosis. Grade 1 L5-S1 anterolisthesis with chronic bilateral L5 pars interarticularis  defects. VERTEBRAE: Acute mild L1 superior endplate burst fracture with less than 25% height loss, 1-2 mm retropulsed bony fragments. Remaining lumbar vertebral bodies intact. Scattered Schmorl's nodes. Osteopenia without destructive bony lesions. PARASPINAL AND OTHER SOFT TISSUES: Small RIGHT pleural effusion. Moderate calcific atherosclerosis. DISC LEVELS: T11-12: Small broad-based disc osteophyte complex without canal stenosis or neural foraminal narrowing. T12-L1, L1-2: Small broad-based disc osteophyte complex. Mild facet arthropathy and ligamentum flavum redundancy  without canal stenosis or neural foraminal narrowing. L2-3: Small broad-based disc bulge. Minimal prevertebral soft tissue at L2 suggesting disc extrusion or blood products (series 4, image 58). No canal stenosis or neural foraminal narrowing. L3-4: Small broad-based disc bulge. Mild facet arthropathy and ligamentum flavum redundancy without canal stenosis. Mild LEFT neural foraminal narrowing. L4-5: Small broad-based and central disc protrusion. Mild facet arthropathy and ligamentum flavum redundancy without canal stenosis. Mild bilateral neural foraminal narrowing. L5-S1: Anterolisthesis. Unroofing of disc osteophyte complex. Mild facet arthropathy without canal stenosis. Severe bilateral neural foraminal narrowing. IMPRESSION: 1. Acute mild L1 burst fracture. Small ventral epidural L2 hemorrhage versus disc extrusion. 2. Grade 1 L5-S1 anterolisthesis on the basis of chronic L5 pars interarticularis defects. 3. No canal stenosis. Neural foraminal narrowing L3-4 through L5-S1: Severe at L5-S1. 4. Small RIGHT pleural effusion. Aortic Atherosclerosis (ICD10-I70.0). Electronically Signed   By: Elon Alas M.D.   On: 04/11/2017 22:49   US Abdomen Limited  Result Date: 04/13/2017 CLINICAL DATA:  Right upper quadrant abdominal pain, chronic. EXAM: ULTRASOUND ABDOMEN LIMITED RIGHT UPPER QUADRANT COMPARISON:  CT abdomen and pelvis 05/11/2016.  Abdominal ultrasound 06/12/2013. FINDINGS: Gallbladder: Small gallstones measuring up to 6 mm in size. No gallbladder wall thickening. No sonographic Murphy sign noted by sonographer. Common bile duct: Diameter: 3 mm Liver: Slightly heterogeneous echotexture diffusely, nonspecific and without focal lesion identified. Portal vein is patent on color Doppler imaging with normal direction of blood flow towards the liver. A right pleural effusion was incidentally noted. IMPRESSION: Cholelithiasis without evidence of cholecystitis. Electronically Signed   By: Logan Bores M.D.   On: 04/13/2017 09:02    Medications: I have reviewed the patient's current medications.  Assessment: 1.Nausea, dry heaves, generalized abdominal pain, change in bowel habits(alternating diarrhea and constipation. Possible -Biliary gastritis, gastroparesis  2.A 6 mm small gallstone noted without gallbladder wall thickening,normal CBD of 3 mm  3. Syncope?vagal event 4. L1 burst fracture 5. 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 Continue Carafate 1 g, will increase to 3 times a day. Continue Protonix 40 mg twice a day. Patient now on Reglan 5 mg IV every 8 hours and Zofran 4 mg every 6 hours as needed.  If there is no concern for prolonged QT interval, recommend erythromycin 3 times a day.  Will continue to follow.    Ronnette Juniper 04/13/2017, 1:03 PM   Pager [phone] If no answer or after 5 PM call [phone]

## 2017-04-13 NOTE — Progress Notes (Addendum)
Patient ID: [name], male   DOB: September 07, 1955, 62 y.o.   MRN: [id number]   Advanced Heart Failure VAD Team Note  PCP-Cardiologist: Shirlee Latch  Subjective:    Neuro Surgeon consulted---CT spine: L1 burst fracture. Continue orthosis.   GI consulted---carafate added, Protonix to bid.    Ramp echo (4/12), speed decreased to 2700 rpm.   Complaining of nausea and back pain, has gas pain lower abdomen..  Worse today. Taking zofran.   LVAD INTERROGATION:  Heartware Flow 3.5 (peak 6.2/trough 2.8), speed 2700 rpm, power 4.   No further alarms  Objective:    Vital Signs:   Temp:  [98.3 F (36.8 C)-98.9 F (37.2 C)] 98.9 F (37.2 C) (04/12 0347) Pulse Rate:  [72-76] 75 (04/11 2322) Resp:  [12-16] 12 (04/11 2322) BP: (92-115)/(75-98) 115/98 (04/12 0400) SpO2:  [98 %-99 %] 99 % (04/11 2322) Weight:  [232 lb 3.2 oz (105.3 kg)] 232 lb 3.2 oz (105.3 kg) (04/12 0550) Last BM Date: 04/11/17 Mean arterial Pressure 90s   Intake/Output:   Intake/Output Summary (Last 24 hours) at 04/13/2017 0801 Last data filed at 04/13/2017 0556 Gross per 24 hour  Intake 960 ml  Output 1500 ml  Net -540 ml     Physical Exam    Physical Exam: GENERAL: Appears chronically ill HEENT: normal  NECK: Supple, JVP 6-7 .  2+ bilaterally, no bruits.  No lymphadenopathy or thyromegaly appreciated.   CARDIAC:  Mechanical heart sounds with LVAD hum present.  LUNGS:  Clear to auscultation bilaterally.  ABDOMEN:  Soft, round, nontender, positive bowel sounds x4.     LVAD exit site: well-healed and incorporated.  Dressing dry and intact.  No erythema or drainage.  Stabilization device present and accurately applied.  EXTREMITIES:  Cool and dry, no cyanosis, clubbing, rash or edema  NEUROLOGIC:  Alert and oriented x 4.  Gait steady.  No aphasia.  No dysarthria.  Affect pleasant.       Telemetry  NSR with PVC personally reviewed.   Labs   Basic Metabolic Panel: Recent Labs  Lab 04/11/17 1941 04/12/17 0234  04/13/17 0257  NA 137 138 137  K 4.0 3.9 3.3*  CL 98* 99* 97*  CO2 24 27 29   GLUCOSE 175* 168* 90  BUN 14 13 11   CREATININE 1.40* 1.35* 1.17  CALCIUM 8.9 8.8* 8.7*    Liver Function Tests: No results for input(s): AST, ALT, ALKPHOS, BILITOT, PROT, ALBUMIN in the last 168 hours. No results for input(s): LIPASE, AMYLASE in the last 168 hours. No results for input(s): AMMONIA in the last 168 hours.  CBC: Recent Labs  Lab 04/11/17 1941 04/12/17 0234 04/13/17 0257  WBC 10.2 9.5 8.1  NEUTROABS 9.2*  --   --   HGB 14.3 13.6 12.9*  HCT 43.3 42.1 40.5  MCV 83.8 84.9 86.0  PLT 147* 150 142*    INR: Recent Labs  Lab 04/11/17 1941 04/12/17 0234 04/13/17 0257  INR 3.02 3.02 2.99    Other results:  EKG:    Imaging   Dg Chest 1 View  Result Date: 04/11/2017 CLINICAL DATA:  Low back pain after fall EXAM: CHEST  1 VIEW COMPARISON:  08/10/2016 FINDINGS: Post sternotomy changes. Left-sided pacing device as before. LVAD similar in position. Cardiomegaly with mild vascular congestion. No acute airspace disease, pleural effusion or pneumothorax. Aortic atherosclerosis. Old right-sided rib fractures. Probable zipper artifact at the thoracic inlet. IMPRESSION: No active disease.  Cardiomegaly with vascular congestion. Electronically Signed   By: Selena Batten  Jake SamplesFujinaga M.D.   On: 04/11/2017 19:23   Dg Lumbar Spine Complete  Result Date: 04/11/2017 CLINICAL DATA:  Low back pain after fall EXAM: LUMBAR SPINE - COMPLETE 4+ VIEW COMPARISON:  CT 04/03/2013, 05/11/2016 FINDINGS: Grade 1 anterolisthesis of L5 on S1 with chronic bilateral pars defect at L5. Age indeterminate mild superior endplate deformity at L1. Remaining vertebral body heights are normal. Aortic atherosclerosis. Moderate degenerative changes at L5-S1. IMPRESSION: 1. Age indeterminate mild superior endplate deformity at L1 2. Grade 1 anterolisthesis of L5 on S1 with chronic bilateral pars defect at L5 Electronically Signed   By: Jasmine PangKim   Fujinaga M.D.   On: 04/11/2017 19:21   Ct Head Wo Contrast  Result Date: 04/11/2017 CLINICAL DATA:  Larey SeatFell today with altered mental status. Left ventricular assist device patient. EXAM: CT HEAD WITHOUT CONTRAST TECHNIQUE: Contiguous axial images were obtained from the base of the skull through the vertex without intravenous contrast. COMPARISON:  None. FINDINGS: Brain: The brain shows a normal appearance without evidence of malformation, accelerated atrophy, old or acute small or large vessel infarction, mass lesion, hemorrhage, hydrocephalus or extra-axial collection. Vascular: There is atherosclerotic calcification of the major vessels at the base of the brain. Skull: Normal.  No traumatic finding.  No focal bone lesion. Sinuses/Orbits: Sinuses are clear. Orbits appear normal. Mastoids are clear. Other: None significant IMPRESSION: No acute finding. No cause of the presenting symptoms is identified. Electronically Signed   By: Paulina FusiMark  Shogry M.D.   On: 04/11/2017 19:25   Ct Lumbar Spine Wo Contrast  Result Date: 04/11/2017 CLINICAL DATA:  Larey SeatFell this morning. Low back pain. History of LEFT ventricular assist device. EXAM: CT LUMBAR SPINE WITHOUT CONTRAST TECHNIQUE: Multidetector CT imaging of the lumbar spine was performed without intravenous contrast administration. Multiplanar CT image reconstructions were also generated. COMPARISON:  Lumbar spine radiographs April 11, 2017 and CT abdomen and pelvis May 11, 2016 FINDINGS: SEGMENTATION: For the purposes of this report the last well-formed intervertebral disc space is reported as L5-S1. ALIGNMENT: Maintained lumbar lordosis. Grade 1 L5-S1 anterolisthesis with chronic bilateral L5 pars interarticularis defects. VERTEBRAE: Acute mild L1 superior endplate burst fracture with less than 25% height loss, 1-2 mm retropulsed bony fragments. Remaining lumbar vertebral bodies intact. Scattered Schmorl's nodes. Osteopenia without destructive bony lesions. PARASPINAL AND  OTHER SOFT TISSUES: Small RIGHT pleural effusion. Moderate calcific atherosclerosis. DISC LEVELS: T11-12: Small broad-based disc osteophyte complex without canal stenosis or neural foraminal narrowing. T12-L1, L1-2: Small broad-based disc osteophyte complex. Mild facet arthropathy and ligamentum flavum redundancy without canal stenosis or neural foraminal narrowing. L2-3: Small broad-based disc bulge. Minimal prevertebral soft tissue at L2 suggesting disc extrusion or blood products (series 4, image 58). No canal stenosis or neural foraminal narrowing. L3-4: Small broad-based disc bulge. Mild facet arthropathy and ligamentum flavum redundancy without canal stenosis. Mild LEFT neural foraminal narrowing. L4-5: Small broad-based and central disc protrusion. Mild facet arthropathy and ligamentum flavum redundancy without canal stenosis. Mild bilateral neural foraminal narrowing. L5-S1: Anterolisthesis. Unroofing of disc osteophyte complex. Mild facet arthropathy without canal stenosis. Severe bilateral neural foraminal narrowing. IMPRESSION: 1. Acute mild L1 burst fracture. Small ventral epidural L2 hemorrhage versus disc extrusion. 2. Grade 1 L5-S1 anterolisthesis on the basis of chronic L5 pars interarticularis defects. 3. No canal stenosis. Neural foraminal narrowing L3-4 through L5-S1: Severe at L5-S1. 4. Small RIGHT pleural effusion. Aortic Atherosclerosis (ICD10-I70.0). Electronically Signed   By: Awilda Metroourtnay  Bloomer M.D.   On: 04/11/2017 22:49     Medications:  Scheduled Medications: . atorvastatin  80 mg Oral Daily  . danazol  100 mg Oral BID  . ezetimibe  10 mg Oral Daily  . ferrous sulfate  325 mg Oral BID  . insulin aspart  0-15 Units Subcutaneous TID WC  . insulin aspart  0-5 Units Subcutaneous QHS  . insulin aspart protamine- aspart  12 Units Subcutaneous BID WC  . losartan  25 mg Oral BID  . pantoprazole  40 mg Oral BID  . sildenafil  20 mg Oral BID  . sucralfate  1 g Oral BID  .  Warfarin - Pharmacist Dosing Inpatient   Does not apply q1800    Infusions:   PRN Medications: acetaminophen, fentaNYL (SUBLIMAZE) injection, hydrALAZINE, ondansetron (ZOFRAN) IV, oxyCODONE-acetaminophen   Patient Profile   62 y.o.smoker with history of PAD, CAD, and ischemic cardiomyopathy now s/p HVAD. Admitted with a fall.   Assessment/Plan:    1. Syncope: This took place in the setting of increased low flow and suction alarms since 4/4, then episode of dry heaving followed by urination directly proceeded syncope.  Review of log files suggests increased afterload or decreased preload state since around 4/4.  He has been out of losartan and has been taking torsemide 60 mg bid.  Possible high afterload with elevated MAP + some degree of dehydration followed by vagal event from dry heaving/nausea + urination.  Speed turned down to 2800 on admit. Had ramp echo with speed turned down to 2700.  2. L1 burst fracture: S/p syncopal episode.   - Neuro Surgery appreciated. Continue with orthosis.  Followup with Dr. Lovell Sheehan in 6 wks.  - Pain control.  3. Chronic systolic CHF: Ischemic cardiomyopathy. Echo in 3/18 with EF 20-25%.St Jude ICD. He is now s/p HVAD placement. Lavare cycle is on.Felt to have degree of RV failure and will not tolerate CVP down to normal range.  No evidence for pump thrombosis.  - Hold torsemide today, restart lower dose in am (40 mg bid, was on 60 bid).  - Control MAP => Increase losartan to 50 mg twice a day.  Give 40 meq K once.   - Ramp echo completed with speed dropped to 2700 - He is going to think about whether he would eventually want to have transplant evaluation. Will have to stop smoking.  4. HTN: Increase losartan to 50 bid.  BP likely potentiated by pain/nausea.   5. CAD: 3 vessel CAD, s/p stent to proximal LAD and PTCA to subtotally occluded large OM1 in 1/18. - Now on warfarin. ASA stopped with GI bleeding.  - Continue atorvastatin 80 mg daily.   6. PAD: Stable claudication, notes with moderate walking on left. Left fem-pop bypass occluded. No pedal ulcerations. Medical management per Dr. Edilia Bo.  7. Carotid stenosis: Followed at [facility].  8. R Pleural effusion: PleurX catheter in past.  9. RV failure: Continue Revatio 20 mg tid.  10. 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 ASA was decreased to 81 daily. Warfarin INR goal is now 2-2.5. He started monthly octreotide injections but had profuse diarrhea with octreotide after 1st injection and after 2nd injection with decreased dose. He is tolerating danazol without problems. 12/18 had more melena in setting of high INR =>due to vigorous epistaxis versus AVM bleeding. ASA was stopped.  - He will stay off ASA 81 and will continue INR goal 2-2.5 for now. INR high 2.99. Discussed with pharmacy.  - Continue danazol.  11. Nausea/dry heaves: Chronic, occurs frequently in the mornings.  GI saw this admission, possibly bile reflux versus diabetic gastroparesis.  - Carafate added, Protonix to bid.  - Adominal [place] results pending.  - Diet changed to low residue, low fiber, and small frequent meals.  - Narcotic pain meds for back pain are probably potentiating his nausea by worsening gastroparesis.  Adding IV Reglan today.   I reviewed the LVAD parameters from today, and compared the results to the patient's prior recorded data.  No programming changes were made.  The LVAD is functioning within specified parameters.  The patient performs LVAD self-test daily.  LVAD interrogation was negative for any significant power changes, alarms or PI events/speed drops.  LVAD equipment check completed and is in good working order.  Back-up equipment present.   LVAD education done on emergency procedures and precautions and reviewed exit site care.  Length of Stay: 2  [name], NP 04/13/2017, 8:01 AM  VAD Team --- VAD ISSUES ONLY--- Pager [phone] (7am - 7am)  Advanced Heart Failure Team  Pager  [phone] (M-F; 7a - 4p)  Please contact [facility] for night-coverage after hours (4p -7a ) and weekends on [email or web address]  Patient seen with NP, agree with the above note.    He is nauseated the morning, still with back pain.  As above, suspect narcotic pain meds for back pain may be worsening GI situation (suspect diabetic gastroparesis).   - Add IV Reglan.  - To get abdominal US today.  - Minimize narcotics to the extent possible.   Management of compression fracture will be orthosis and followup in 6 wks with Dr. Lovell Sheehan.   MAP high, increase losartan to 50 mg bid.  Will hold torsemide today, possibly restart tomorrow. Labs are stable.   Ramp echo yesterday, turned down speed of HVAD.  Hopefully this will help limit suction events/low flow.  No further alarms on LVAD interrogation today.   Mobilize as above with PT.   Suspect he will need CIR.   Marca Ancona 04/13/2017 8:39 AM

## 2017-04-14 LAB — BASIC METABOLIC PANEL
Anion gap: 10 (ref 5–15)
BUN: 10 mg/dL (ref 6–20)
CHLORIDE: 96 mmol/L — AB (ref 101–111)
CO2: 28 mmol/L (ref 22–32)
Calcium: 8.7 mg/dL — ABNORMAL LOW (ref 8.9–10.3)
Creatinine, Ser: 1.19 mg/dL (ref 0.61–1.24)
GFR calc Af Amer: >60 mL/min (ref >60)
GLUCOSE: 115 mg/dL — AB (ref 65–99)
POTASSIUM: 3.6 mmol/L (ref 3.5–5.1)
Sodium: 134 mmol/L — ABNORMAL LOW (ref 135–145)

## 2017-04-14 LAB — CBC
HEMATOCRIT: 40 % (ref 39.0–52.0)
HEMOGLOBIN: 12.5 g/dL — AB (ref 13.0–17.0)
MCH: 26.6 pg (ref 26.0–34.0)
MCHC: 31.3 g/dL (ref 30.0–36.0)
MCV: 85.1 fL (ref 78.0–100.0)
Platelets: 145 10*3/uL — ABNORMAL LOW (ref 150–400)
RBC: 4.7 MIL/uL (ref 4.22–5.81)
RDW: 16.3 % — ABNORMAL HIGH (ref 11.5–15.5)
WBC: 7.2 10*3/uL (ref 4.0–10.5)

## 2017-04-14 LAB — GLUCOSE, CAPILLARY
Glucose-Capillary: 104 mg/dL — ABNORMAL HIGH (ref 65–99)
Glucose-Capillary: 182 mg/dL — ABNORMAL HIGH (ref 65–99)
Glucose-Capillary: 156 mg/dL — ABNORMAL HIGH (ref 65–99)
Glucose-Capillary: 85 mg/dL (ref 65–99)

## 2017-04-14 LAB — PROTIME-INR
INR: 2.65
Prothrombin Time: 28 seconds — ABNORMAL HIGH (ref 11.4–15.2)

## 2017-04-14 LAB — LACTATE DEHYDROGENASE: LDH: 187 U/L (ref 98–192)

## 2017-04-14 MED ORDER — POTASSIUM CHLORIDE CRYS ER 20 MEQ PO TBCR
40.0000 meq | EXTENDED_RELEASE_TABLET | Freq: Every day | ORAL | Status: DC
  Administered 2017-04-14 – 2017-04-16 (×3): 40 meq via ORAL
  Filled 2017-04-14 (×3): qty 2

## 2017-04-14 MED ORDER — WARFARIN SODIUM 4 MG PO TABS
4.0000 mg | ORAL_TABLET | Freq: Once | ORAL | Status: AC
Start: 2017-04-14 — End: 2017-04-14
  Administered 2017-04-14: 4 mg via ORAL
  Filled 2017-04-14: qty 1

## 2017-04-14 MED ORDER — TORSEMIDE 20 MG PO TABS
40.0000 mg | ORAL_TABLET | Freq: Two times a day (BID) | ORAL | Status: DC
  Administered 2017-04-14 – 2017-04-16 (×5): 40 mg via ORAL
  Filled 2017-04-14 (×6): qty 2

## 2017-04-14 NOTE — Progress Notes (Signed)
[facility] Progress Note  Dalton RainwaterLarry D [name] 62 y.o. 09-15-1955   Subjective: Felt nauseous this morning but better now. Denies abdominal pain. Awake, lying in bed.  Objective: Vital signs: Vitals:   04/14/17 0742 04/14/17 1212  BP: (!) 88/72   Pulse: 80 80  Resp: 10 12  Temp: 98.3 F (36.8 C) 98.9 F (37.2 C)  SpO2: 97% 97%    Physical Exam: Gen: alert, no acute distress, elderly HEENT: anicteric sclera CV: RRR Chest: CTA B Abd: diffuse tenderness with guarding, soft, nondistended, +BS Ext: no edema  Lab Results: Recent Labs    04/13/17 0257 04/14/17 0244  NA 137 134*  K 3.3* 3.6  CL 97* 96*  CO2 29 28  GLUCOSE 90 115*  BUN 11 10  CREATININE 1.17 1.19  CALCIUM 8.7* 8.7*   No results for input(s): AST, ALT, ALKPHOS, BILITOT, PROT, ALBUMIN in the last 72 hours. Recent Labs    04/11/17 1941  04/13/17 0257 04/14/17 0244  WBC 10.2   < > 8.1 7.2  NEUTROABS 9.2*  --   --   --   HGB 14.3   < > 12.9* 12.5*  HCT 43.3   < > 40.5 40.0  MCV 83.8   < > 86.0 85.1  PLT 147*   < > 142* 145*   < > = values in this interval not displayed.      Assessment/Plan: Nausea/vomiting improving on IV Reglan. Tolerating low residue diet. Would hold off on E-mycin since clinically improving. Continue PPI BID and Carafate. Continue supportive care. Will follow.   Marcianne Ozbun C. 04/14/2017, 3:53 PM  Pager [phone]  AFTER 5 PM or on weekends please call [phone] ID: Dalton RainwaterLarry D [name], male   DOB: 09-15-1955, 62 y.o.   MRN: [id number]

## 2017-04-14 NOTE — Progress Notes (Signed)
Back brace rep came to bedside to reassess possibility to apply brace around HVAD without compressing it. However, he found no way to protect HVAD driveline from compression. He said to alter the brace where the HVAD site is located would be to also compromise the strength of the brace. His recommendation & education to the pt for the time being would be to remain as flat as possible in bed & only logroll when turning. He will have a supervisor come in Monday to try and further troubleshoot.   [place]  Winfred Iiams, RN

## 2017-04-14 NOTE — Progress Notes (Signed)
Called number on back brace to see if someone can come assist us in properly applying it around the HVAD site. Secretary that answered said she will page a rep and call me back with more information.  [place]  Brisha Mccabe, RN

## 2017-04-14 NOTE — Progress Notes (Signed)
Patient ID: Dalton RainwaterLarry D Zern, male   DOB: 1955/07/31, 62 y.o.   MRN: [id number]   Advanced Heart Failure VAD Team Note  PCP-Cardiologist: Shirlee LatchMcLean  Subjective:    Neuro Surgeon consulted---CT spine: L1 burst fracture. Need help fitting back brace.   GI consulted---carafate added, Protonix to bid.  He is on Reglan.    Ramp echo (4/12), speed decreased to 2700 rpm.   Nausea improved.  Back slowly improving, has been able to get up.   LVAD INTERROGATION:  Heartware Flow 4.1, speed 2700 rpm, power 4.5.   No further alarms  Objective:    Vital Signs:   Temp:  [98.3 F (36.8 C)-98.9 F (37.2 C)] 98.9 F (37.2 C) (04/13 1212) Pulse Rate:  [75-85] 80 (04/13 1212) Resp:  [10-19] 12 (04/13 1212) BP: (85-103)/(69-84) 88/72 (04/13 0742) SpO2:  [95 %-99 %] 97 % (04/13 1212) Weight:  [232 lb 8 oz (105.5 kg)] 232 lb 8 oz (105.5 kg) (04/13 0422) Last BM Date: 04/11/17 Mean arterial Pressure 86   Intake/Output:   Intake/Output Summary (Last 24 hours) at 04/14/2017 1325 Last data filed at 04/14/2017 0900 Gross per 24 hour  Intake 718 ml  Output 850 ml  Net -132 ml     Physical Exam    Physical Exam: GENERAL: Well appearing this am. NAD.  HEENT: Normal. NECK: Supple, JVP 9-10 cm. Carotids OK.  CARDIAC:  Mechanical heart sounds with LVAD hum present.  LUNGS:  CTAB, normal effort.  ABDOMEN:  NT, ND, no HSM. No bruits or masses. +BS  LVAD exit site: Well-healed and incorporated. Dressing dry and intact. No erythema or drainage. Stabilization device present and accurately applied. Driveline dressing changed daily per sterile technique. EXTREMITIES:  Warm and dry. No cyanosis, clubbing, rash.  1+ ankle edema.  NEUROLOGIC:  Alert & oriented x 3. Cranial nerves grossly intact. Moves all 4 extremities w/o difficulty. Affect pleasant    Telemetry   NSR with PVC personally reviewed.   Labs   Basic Metabolic Panel: Recent Labs  Lab 04/11/17 1941 04/12/17 0234 04/13/17 0257  04/14/17 0244  NA 137 138 137 134*  K 4.0 3.9 3.3* 3.6  CL 98* 99* 97* 96*  CO2 24 27 29 28   GLUCOSE 175* 168* 90 115*  BUN 14 13 11 10   CREATININE 1.40* 1.35* 1.17 1.19  CALCIUM 8.9 8.8* 8.7* 8.7*    Liver Function Tests: No results for input(s): AST, ALT, ALKPHOS, BILITOT, PROT, ALBUMIN in the last 168 hours. No results for input(s): LIPASE, AMYLASE in the last 168 hours. No results for input(s): AMMONIA in the last 168 hours.  CBC: Recent Labs  Lab 04/11/17 1941 04/12/17 0234 04/13/17 0257 04/14/17 0244  WBC 10.2 9.5 8.1 7.2  NEUTROABS 9.2*  --   --   --   HGB 14.3 13.6 12.9* 12.5*  HCT 43.3 42.1 40.5 40.0  MCV 83.8 84.9 86.0 85.1  PLT 147* 150 142* 145*    INR: Recent Labs  Lab 04/11/17 1941 04/12/17 0234 04/13/17 0257 04/14/17 0244  INR 3.02 3.02 2.99 2.65    Other results:  EKG:    Imaging   [place] Abdomen Limited  Result Date: 04/13/2017 CLINICAL DATA:  Right upper quadrant abdominal pain, chronic. EXAM: ULTRASOUND ABDOMEN LIMITED RIGHT UPPER QUADRANT COMPARISON:  CT abdomen and pelvis 05/11/2016. Abdominal ultrasound 06/12/2013. FINDINGS: Gallbladder: Small gallstones measuring up to 6 mm in size. No gallbladder wall thickening. No sonographic Murphy sign noted by sonographer. Common bile duct: Diameter: 3 mm  Liver: Slightly heterogeneous echotexture diffusely, nonspecific and without focal lesion identified. Portal vein is patent on color Doppler imaging with normal direction of blood flow towards the liver. A right pleural effusion was incidentally noted. IMPRESSION: Cholelithiasis without evidence of cholecystitis. Electronically Signed   By: Sebastian Ache M.D.   On: 04/13/2017 09:02     Medications:     Scheduled Medications: . atorvastatin  80 mg Oral Daily  . danazol  100 mg Oral BID  . ezetimibe  10 mg Oral Daily  . ferrous sulfate  325 mg Oral BID  . insulin aspart  0-15 Units Subcutaneous TID WC  . insulin aspart  0-5 Units Subcutaneous QHS   . insulin aspart protamine- aspart  12 Units Subcutaneous BID WC  . losartan  50 mg Oral BID  . metoCLOPramide (REGLAN) injection  5 mg Intravenous Q8H  . pantoprazole  40 mg Oral BID  . potassium chloride  40 mEq Oral Daily  . sildenafil  20 mg Oral BID  . sucralfate  1 g Oral TID WC & HS  . torsemide  40 mg Oral BID  . traMADol  50 mg Oral Q6H  . warfarin  4 mg Oral ONCE-1800  . Warfarin - Pharmacist Dosing Inpatient   Does not apply q1800    Infusions:   PRN Medications: acetaminophen, fentaNYL (SUBLIMAZE) injection, hydrALAZINE, ondansetron (ZOFRAN) IV, oxyCODONE-acetaminophen   Patient Profile   62 y.o.smoker with history of PAD, CAD, and ischemic cardiomyopathy now s/p HVAD. Admitted with a fall.   Assessment/Plan:    1. Syncope: This took place in the setting of increased low flow and suction alarms since 4/4, then episode of dry heaving followed by urination directly proceeded syncope.  Review of log files suggests increased afterload or decreased preload state since around 4/4.  He has been out of losartan and has been taking torsemide 60 mg bid.  Possible high afterload with elevated MAP + some degree of dehydration followed by vagal event from dry heaving/nausea + urination.  Speed turned down to 2800 on admit. Had ramp echo with speed turned down to 2700.  - No further suction events on HVAD interrogation.  2. L1 burst fracture: S/p syncopal episode.  Pain improving.  - Neuro Surgery appreciated.  Followup with Dr. Lovell Sheehan in 6 wks.  - Has back brace but having difficulty getting it on.  Nursing to call for assistance in getting brace on him.  3. Chronic systolic CHF: Ischemic cardiomyopathy. Echo in 3/18 with EF 20-25%.St Jude ICD. He is now s/p HVAD placement. Lavare cycle is on.Felt to have degree of RV failure and will not tolerate CVP down to normal range.  No evidence for pump thrombosis.  Some volume overload on exam.  - Restart torsemide 40 mg bid with KCl  40 daily.   - MAP better on losartan to 50 mg twice a day.     - Ramp echo completed with speed dropped to 2700 - He is going to think about whether he would eventually want to have transplant evaluation. Will have to stop smoking.  4. HTN: MAP better on higher losartan.   5. CAD: 3 vessel CAD, s/p stent to proximal LAD and PTCA to subtotally occluded large OM1 in 1/18. - Now on warfarin. ASA stopped with GI bleeding.  - Continue atorvastatin 80 mg daily.  6. PAD: Stable claudication, notes with moderate walking on left. Left fem-pop bypass occluded. No pedal ulcerations. Medical management per Dr. Edilia Bo.  7. Carotid  stenosis: Followed at [facility].  8. R Pleural effusion: PleurX catheter in past.  9. RV failure: Continue Revatio 20 mg tid.  10. 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 ASA was decreased to 81 daily. Warfarin INR goal is now 2-2.5. He started monthly octreotide injections but had profuse diarrhea with octreotide after 1st injection and after 2nd injection with decreased dose. He is tolerating danazol without problems. 12/18 had more melena in setting of high INR =>due to vigorous epistaxis versus AVM bleeding. ASA was stopped.  - He will stay off ASA 81 and will continue INR goal 2-2.5 for now. - Continue danazol.  11. Nausea/dry heaves: Chronic, occurs frequently in the mornings.  GI saw this admission, possibly bile reflux versus diabetic gastroparesis. Abdominal US did not show acute cholecystitis. Nausea is now better.  - Carafate added, Protonix to bid.  - Continue Reglan.  - If GI would like trial of erythryomycin, think it would be ok.  - Diet changed to low residue, low fiber, and small frequent meals.   Needs to get up with PT.  May need CIR.   I reviewed the LVAD parameters from today, and compared the results to the patient's prior recorded data.  No programming changes were made.  The LVAD is functioning within specified parameters.  The patient  performs LVAD self-test daily.  LVAD interrogation was negative for any significant power changes, alarms or PI events/speed drops.  LVAD equipment check completed and is in good working order.  Back-up equipment present.   LVAD education done on emergency procedures and precautions and reviewed exit site care.  Length of Stay: 3  Marca Ancona, MD 04/14/2017, 1:25 PM  VAD Team --- VAD ISSUES ONLY--- Pager [phone] (7am - 7am)  Advanced Heart Failure Team  Pager [phone] (M-F; 7a - 4p)  Please contact [facility] for night-coverage after hours (4p -7a ) and weekends on [email or web address]

## 2017-04-14 NOTE — Progress Notes (Signed)
Transparent/gauze dressings applied to left arm as abrasions are bleeding.   [place]  Rhys Lichty, RN

## 2017-04-14 NOTE — Evaluation (Signed)
Physical Therapy Evaluation Patient Details Name: [name] MRN: [id number] DOB: 30-Nov-1955 Today's Date: 04/14/2017   History of Present Illness  Pt is a 62 y/o male admitted secondary to sustaining a fall due to a syncopal episode. Pt found to have an L1 burst fx. 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     Clinical Impression  Pt presented supine in bed with HOB elevated, awake and willing to participate in therapy session. Prior to admission, pt reported that he was independent with all functional mobility and ADLs. Pt currently requires min guard for bed mobility, min guard for safety with transfers and min guard for safety with short distance ambulation without use of an AD. Pt unable to tolerate TLSO (clam shell) brace over drive line of HVAD. RN notified and paged [employer] rep. Pt able to transition himself to his portable batteries for his HVAD independently. Pt becoming frustrated and unhooking himself from the hospital monitors when trying to get to the bathroom. Once back in bed, pt re-connected to HR and SPO2 monitor with VSS. Pt would continue to benefit from skilled physical therapy services at this time while admitted and after d/c to address the below listed limitations in order to improve overall safety and independence with functional mobility. PT will continue to follow acutely to progress mobility as tolerated.     Follow Up Recommendations Home health PT;Supervision/Assistance - 24 hour    Equipment Recommendations  None recommended by PT    Recommendations for Other Services       Precautions / Restrictions Precautions Precautions: Fall Precaution Comments: old HVAD Restrictions Weight Bearing Restrictions: No      Mobility  Bed Mobility Overal bed mobility: Needs Assistance Bed Mobility: Rolling;Sidelying to Sit;Sit to Sidelying Rolling: Min guard Sidelying to sit: Min guard     Sit to sidelying: Min  guard General bed mobility comments: min guard for safety, pt with increased pain with sidelying and sitting  Transfers Overall transfer level: Needs assistance Equipment used: None Transfers: Sit to/from Stand Sit to Stand: Supervision         General transfer comment: supervision for safety, pt a bit impulsive  Ambulation/Gait Ambulation/Gait assistance: Min guard Ambulation Distance (Feet): 10 Feet(10' x2) Assistive device: None Gait Pattern/deviations: Step-through pattern;Decreased stride length;Drifts right/left Gait velocity: decreased Gait velocity interpretation: <1.8 ft/sec, indicate of risk for recurrent falls General Gait Details: modest instability with one mild LOB that he was able to correct with min A; pt ambulated from his bed to the bathroom and back to bed without use of an AD but frequently reaching for support surfaces  Stairs            Wheelchair Mobility    Modified Rankin (Stroke Patients Only)       Balance Overall balance assessment: Needs assistance Sitting-balance support: Feet supported Sitting balance-Leahy Scale: Good     Standing balance support: During functional activity;No upper extremity supported Standing balance-Leahy Scale: Fair                               Pertinent Vitals/Pain Pain Assessment: Faces Faces Pain Scale: Hurts little more Pain Location: back and abdomen Pain Descriptors / Indicators: Sore;Guarding Pain Intervention(s): Monitored during session    Home Living Family/patient expects to be discharged to:: Private residence Living Arrangements: Parent Available Help at Discharge: Family;Available 24 hours/day Type of Home:  House Home Access: Ramped entrance     Home Layout: Able to live on main level with bedroom/bathroom Home Equipment: Dan HumphreysWalker - 2 wheels;Cane - single point      Prior Function Level of Independence: Independent               Hand Dominance         Extremity/Trunk Assessment   Upper Extremity Assessment Upper Extremity Assessment: Overall WFL for tasks assessed    Lower Extremity Assessment Lower Extremity Assessment: Overall WFL for tasks assessed    Cervical / Trunk Assessment Cervical / Trunk Assessment: Other exceptions Cervical / Trunk Exceptions: acute L1 fx  Communication   Communication: No difficulties  Cognition Arousal/Alertness: Awake/alert Behavior During Therapy: WFL for tasks assessed/performed(a bit impulsive) Overall Cognitive Status: Impaired/Different from baseline Area of Impairment: Safety/judgement                         Safety/Judgement: Decreased awareness of safety            General Comments      Exercises     Assessment/Plan    PT Assessment Patient needs continued PT services  PT Problem List Decreased activity tolerance;Decreased balance;Decreased mobility;Decreased coordination;Decreased safety awareness;Cardiopulmonary status limiting activity       PT Treatment Interventions DME instruction;Gait training;Stair training;Therapeutic activities;Therapeutic exercise;Functional mobility training;Balance training;Neuromuscular re-education;Patient/family education    PT Goals (Current goals can be found in the Care Plan section)  Acute Rehab PT Goals Patient Stated Goal: "If I can get to feeling better I won't need that thing!" (referring to his TLSO) PT Goal Formulation: With patient Time For Goal Achievement: 04/28/17 Potential to Achieve Goals: Good    Frequency Min 3X/week   Barriers to discharge        Co-evaluation               AM-PAC PT "6 Clicks" Daily Activity  Outcome Measure Difficulty turning over in bed (including adjusting bedclothes, sheets and blankets)?: None Difficulty moving from lying on back to sitting on the side of the bed? : A Little Difficulty sitting down on and standing up from a chair with arms (e.g., wheelchair, bedside  commode, etc,.)?: A Little Help needed moving to and from a bed to chair (including a wheelchair)?: A Little Help needed walking in hospital room?: A Little Help needed climbing 3-5 steps with a railing? : A Little 6 Click Score: 19    End of Session   Activity Tolerance: Patient limited by fatigue;Patient limited by pain Patient left: in bed;with call bell/phone within reach Nurse Communication: Mobility status;Other (comment)(pt not tolerating TLSO, pt on batteries for HVAD at end) PT Visit Diagnosis: Other abnormalities of gait and mobility (R26.89)    Time: [id number] PT Time Calculation (min) (ACUTE ONLY): 26 min   Charges:   PT Evaluation $PT Eval Moderate Complexity: 1 Mod PT Treatments $Therapeutic Activity: 8-22 mins   PT G Codes:        BondJennifer Desmond Tufano, PT, DPT [phone]   Alessandra BevelsJennifer M Margueritte Guthridge 04/14/2017, 1:50 PM

## 2017-04-14 NOTE — Progress Notes (Signed)
ANTICOAGULATION CONSULT NOTE - Follow Up Consult  Pharmacy Consult for warfarin Indication: LVAD  Allergies  Allergen Reactions  . Metformin And Related Nausea And Vomiting    *Only the extended release* (does this)  . Niacin And Related Other (See Comments)    REACTION IS SIDE EFFECT "SEVERE" FLUSHING    Patient Measurements: Height: 6' (182.9 cm) Weight: 232 lb 8 oz (105.5 kg) IBW/kg (Calculated) : 77.6   Vital Signs: Temp: 98.3 F (36.8 C) (04/13 0742) Temp Source: Oral (04/13 0742) BP: 88/72 (04/13 0742) Pulse Rate: 80 (04/13 0742)  Labs: Recent Labs    04/12/17 0234 04/13/17 0257 04/14/17 0244  HGB 13.6 12.9* 12.5*  HCT 42.1 40.5 40.0  PLT 150 142* 145*  LABPROT 31.0* 30.8* 28.0*  INR 3.02 2.99 2.65  CREATININE 1.35* 1.17 1.19    Estimated Creatinine Clearance: 80.8 mL/min (by C-G formula based on SCr of 1.19 mg/dL).   Assessment: 62 yo male admitted through ED with syncope and fall. Had HVAD placed 5/18- past cardiac history CAD and CHF. Pharmacy consulted for warfarin dosing. Patient has hx of upper/lower GI bleed and has INR goal of 2-2.5.  INR 2.65 this am, continues to trend down as we have been holding warfarin this entire admission. Hgb still slow trend down at 12.5, LDH stable, plt low stable. No bleeding noted overnight. Will restart warfarin tonight.   Goal of Therapy:  INR 2.0-2.5 Monitor platelets by anticoagulation protocol: Yes   Plan:  Warfarin 4mg  tonight Daily INR  Sheppard CoilFrank Ebba Goll PharmD., BCPS Clinical Pharmacist 04/14/2017 9:22 AM

## 2017-04-15 LAB — LACTATE DEHYDROGENASE: LDH: 183 U/L (ref 98–192)

## 2017-04-15 LAB — CBC
HEMATOCRIT: 38 % — AB (ref 39.0–52.0)
Hemoglobin: 12 g/dL — ABNORMAL LOW (ref 13.0–17.0)
MCH: 27 pg (ref 26.0–34.0)
MCHC: 31.6 g/dL (ref 30.0–36.0)
MCV: 85.4 fL (ref 78.0–100.0)
Platelets: 135 10*3/uL — ABNORMAL LOW (ref 150–400)
RBC: 4.45 MIL/uL (ref 4.22–5.81)
RDW: 16.6 % — AB (ref 11.5–15.5)
WBC: 6.7 10*3/uL (ref 4.0–10.5)

## 2017-04-15 LAB — GLUCOSE, CAPILLARY
GLUCOSE-CAPILLARY: 108 mg/dL — AB (ref 65–99)
GLUCOSE-CAPILLARY: 148 mg/dL — AB (ref 65–99)
Glucose-Capillary: 185 mg/dL — ABNORMAL HIGH (ref 65–99)
Glucose-Capillary: 140 mg/dL — ABNORMAL HIGH (ref 65–99)

## 2017-04-15 LAB — BASIC METABOLIC PANEL
Anion gap: 11 (ref 5–15)
BUN: 11 mg/dL (ref 6–20)
CALCIUM: 8.6 mg/dL — AB (ref 8.9–10.3)
CO2: 28 mmol/L (ref 22–32)
CREATININE: 1.2 mg/dL (ref 0.61–1.24)
Chloride: 95 mmol/L — ABNORMAL LOW (ref 101–111)
GFR calc non Af Amer: >60 mL/min (ref >60)
GLUCOSE: 107 mg/dL — AB (ref 65–99)
Potassium: 3.7 mmol/L (ref 3.5–5.1)
Sodium: 134 mmol/L — ABNORMAL LOW (ref 135–145)

## 2017-04-15 LAB — PROTIME-INR
INR: 2.21
Prothrombin Time: 24.4 seconds — ABNORMAL HIGH (ref 11.4–15.2)

## 2017-04-15 MED ORDER — WARFARIN SODIUM 5 MG PO TABS
5.0000 mg | ORAL_TABLET | Freq: Once | ORAL | Status: AC
  Administered 2017-04-15: 5 mg via ORAL
  Filled 2017-04-15: qty 1

## 2017-04-15 NOTE — Progress Notes (Signed)
ANTICOAGULATION CONSULT NOTE - Follow Up Consult  Pharmacy Consult for warfarin Indication: LVAD  Allergies  Allergen Reactions  . Metformin And Related Nausea And Vomiting    *Only the extended release* (does this)  . Niacin And Related Other (See Comments)    REACTION IS SIDE EFFECT "SEVERE" FLUSHING    Patient Measurements: Height: 6' (182.9 cm) Weight: 232 lb 8 oz (105.5 kg) IBW/kg (Calculated) : 77.6   Vital Signs: Temp: 98.5 F (36.9 C) (04/14 0734) Temp Source: Oral (04/14 0734) BP: 101/80 (04/14 0734) Pulse Rate: 78 (04/14 0734)  Labs: Recent Labs    04/13/17 0257 04/14/17 0244 04/15/17 0626  HGB 12.9* 12.5* 12.0*  HCT 40.5 40.0 38.0*  PLT 142* 145* 135*  LABPROT 30.8* 28.0* 24.4*  INR 2.99 2.65 2.21  CREATININE 1.17 1.19 1.20    Estimated Creatinine Clearance: 80.2 mL/min (by C-G formula based on SCr of 1.2 mg/dL).   Assessment: 62 yo male admitted through ED with syncope and fall. Had HVAD placed 5/18- past cardiac history CAD and CHF. Pharmacy consulted for warfarin dosing. Patient has hx of upper/lower GI bleed and has INR goal of 2-2.5.  INR 2.2 this am, continues to trend down we had been holding warfarin but resumed last night. Hgb still slow trend down at 12, LDH stable, plt low stable. Noted left arm abrasions bleeding yesterday.   PTA warfarin dose: 5mg  daily  Goal of Therapy:  INR 2.0-2.5 Monitor platelets by anticoagulation protocol: Yes   Plan:  Warfarin 5 mg tonight Daily INR  Sheppard CoilFrank Wilson PharmD., BCPS Clinical Pharmacist 04/15/2017 10:44 AM

## 2017-04-15 NOTE — Progress Notes (Signed)
Patient ID: [name], male   DOB: [birth year], 62 y.o.   MRN: [id number]   Advanced Heart Failure VAD Team Note  PCP-Cardiologist: Shirlee Latch  Subjective:    Neurosurgeon consulted---CT spine: L1 burst fracture. Need help fitting back brace.   GI consulted---carafate added, Protonix to bid.  He is on Reglan.    Ramp echo (4/12), speed decreased to 2700 rpm.   Nausea improved.  Back slowly improving, has been able to get up.  Still not able to get back brace fitting.  Someone else is coming in Monday to try.   No dyspnea, back on torsemide. MAP 80s.   LVAD INTERROGATION:  [employer]ware Flow 3.9, speed 2700 rpm, power 4.4 W.   No further alarms  Objective:    Vital Signs:   Temp:  [97.8 F (36.6 C)-98.9 F (37.2 C)] 98.5 F (36.9 C) (04/14 0734) Pulse Rate:  [71-81] 78 (04/14 0734) Resp:  [12-17] 17 (04/14 0734) BP: (85-114)/(75-96) 101/80 (04/14 0734) SpO2:  [97 %-100 %] 100 % (04/14 0734) Last BM Date: 04/11/17 Mean arterial Pressure 80s  Intake/Output:   Intake/Output Summary (Last 24 hours) at 04/15/2017 1159 Last data filed at 04/15/2017 1100 Gross per 24 hour  Intake 1260 ml  Output 1300 ml  Net -40 ml     Physical Exam    Physical Exam: GENERAL: Well appearing this am. NAD.  HEENT: Normal. NECK: Supple, JVP 8 cm. Carotids OK.  CARDIAC:  Mechanical heart sounds with LVAD hum present.  LUNGS:  CTAB, normal effort.  ABDOMEN:  NT, ND, no HSM. No bruits or masses. +BS  LVAD exit site: Well-healed and incorporated. Dressing dry and intact. No erythema or drainage. Stabilization device present and accurately applied. Driveline dressing changed daily per sterile technique. EXTREMITIES:  Warm and dry. No cyanosis, clubbing, rash. 1+ ankle edema.  NEUROLOGIC:  Alert & oriented x 3. Cranial nerves grossly intact. Moves all 4 extremities w/o difficulty. Affect pleasant     Telemetry   NSR 70s personally reviewed.   Labs   Basic Metabolic Panel: Recent Labs  Lab  04/11/17 1941 04/12/17 0234 04/13/17 0257 04/14/17 0244 04/15/17 0626  NA 137 138 137 134* 134*  K 4.0 3.9 3.3* 3.6 3.7  CL 98* 99* 97* 96* 95*  CO2 24 27 29 28 28   GLUCOSE 175* 168* 90 115* 107*  BUN 14 13 11 10 11   CREATININE 1.40* 1.35* 1.17 1.19 1.20  CALCIUM 8.9 8.8* 8.7* 8.7* 8.6*    Liver Function Tests: No results for input(s): AST, ALT, ALKPHOS, BILITOT, PROT, ALBUMIN in the last 168 hours. No results for input(s): LIPASE, AMYLASE in the last 168 hours. No results for input(s): AMMONIA in the last 168 hours.  CBC: Recent Labs  Lab 04/11/17 1941 04/12/17 0234 04/13/17 0257 04/14/17 0244 04/15/17 0626  WBC 10.2 9.5 8.1 7.2 6.7  NEUTROABS 9.2*  --   --   --   --   HGB 14.3 13.6 12.9* 12.5* 12.0*  HCT 43.3 42.1 40.5 40.0 38.0*  MCV 83.8 84.9 86.0 85.1 85.4  PLT 147* 150 142* 145* 135*    INR: Recent Labs  Lab 04/11/17 1941 04/12/17 0234 04/13/17 0257 04/14/17 0244 04/15/17 0626  INR 3.02 3.02 2.99 2.65 2.21    Other results:  EKG:    Imaging   No results found.   Medications:     Scheduled Medications: . atorvastatin  80 mg Oral Daily  . danazol  100 mg Oral BID  .  ezetimibe  10 mg Oral Daily  . ferrous sulfate  325 mg Oral BID  . insulin aspart  0-15 Units Subcutaneous TID WC  . insulin aspart  0-5 Units Subcutaneous QHS  . insulin aspart protamine- aspart  12 Units Subcutaneous BID WC  . losartan  50 mg Oral BID  . metoCLOPramide (REGLAN) injection  5 mg Intravenous Q8H  . pantoprazole  40 mg Oral BID  . potassium chloride  40 mEq Oral Daily  . sildenafil  20 mg Oral BID  . sucralfate  1 g Oral TID WC & HS  . torsemide  40 mg Oral BID  . traMADol  50 mg Oral Q6H  . warfarin  5 mg Oral ONCE-1800  . Warfarin - Pharmacist Dosing Inpatient   Does not apply q1800    Infusions:   PRN Medications: acetaminophen, fentaNYL (SUBLIMAZE) injection, hydrALAZINE, ondansetron (ZOFRAN) IV, oxyCODONE-acetaminophen   Patient Profile   62  y.o.smoker with history of PAD, CAD, and ischemic cardiomyopathy now s/p HVAD. Admitted with a fall.   Assessment/Plan:    1. Syncope: This took place in the setting of increased low flow and suction alarms since 4/4, then episode of dry heaving followed by urination directly proceeded syncope.  Review of log files suggests increased afterload or decreased preload state since around 4/4.  He had been out of losartan and had been taking torsemide 60 mg bid.  Possible high afterload with elevated MAP + some degree of dehydration followed by vagal event from dry heaving/nausea + urination.  Speed turned down to 2800 on admit. Had ramp echo with speed turned down to 2700.  - No further suction events on HVAD interrogation.  2. L1 burst fracture: S/p syncopal episode.  Pain improving. Able to walk some.  - Neurosurgery appreciated.  Followup with Dr. Lovell SheehanJenkins in 6 wks.  - Has back brace but having difficulty getting it on.  Unable to fit him yesterday, a different person apparently is going to come in Monday to try to fit him.   3. Chronic systolic CHF: Ischemic cardiomyopathy. Echo in 3/18 with EF 20-25%.[employer] ICD. He is now s/p HVAD placement. Lavare cycle is on.Felt to have degree of RV failure and will not tolerate CVP down to normal range.  No evidence for pump thrombosis.  Torsemide restarted, volume looks ok.  - Continue torsemide 40 mg bid with KCl 40 daily.   - MAP better on losartan to 50 mg twice a day.     - Ramp echo completed with speed dropped to 2700 - He is going to think about whether he would eventually want to have transplant evaluation. Will have to stop smoking.  4. HTN: MAP better on higher losartan.   5. CAD: 3 vessel CAD, s/p stent to proximal LAD and PTCA to subtotally occluded large OM1 in 1/18. - Now on warfarin. ASA stopped with GI bleeding.  - Continue atorvastatin 80 mg daily.  6. PAD: Stable claudication, notes with moderate walking on left. Left fem-pop  bypass occluded. No pedal ulcerations. Medical management per Dr. Edilia Boickson.  7. Carotid stenosis: Followed at [facility].  8. R Pleural effusion: PleurX catheter in past.  9. RV failure: Continue Revatio 20 mg tid.  10. 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 ASA was decreased to 81 daily. Warfarin INR goal is now 2-2.5. He started monthly octreotide injections but had profuse diarrhea with octreotide after 1st injection and after 2nd injection with decreased dose. He is tolerating danazol  without problems. 12/18 had more melena in setting of high INR =>due to vigorous epistaxis versus AVM bleeding. ASA was stopped. Hgb stable.  - He will stay off ASA 81 and will continue INR goal 2-2.5 for now. - Continue danazol.  11. Nausea/dry heaves: Chronic, occurs frequently in the mornings.  GI saw this admission, possibly bile reflux versus diabetic gastroparesis. Abdominal US did not show acute cholecystitis. Nausea is now better.  - Carafate added, Protonix to bid.  - Continue Reglan => po for home.  - Per GI, can hold off on erythromycin.  - Diet changed to low residue, low fiber, and small frequent meals.   Continue to work with PT, he should be able to go home rather than to CIR.  Will need to try again to get back brace fitted tomorrow, after fitting can go home (Monday).    I reviewed the LVAD parameters from today, and compared the results to the patient's prior recorded data.  No programming changes were made.  The LVAD is functioning within specified parameters.  The patient performs LVAD self-test daily.  LVAD interrogation was negative for any significant power changes, alarms or PI events/speed drops.  LVAD equipment check completed and is in good working order.  Back-up equipment present.   LVAD education done on emergency procedures and precautions and reviewed exit site care.  Length of Stay: 4  Marca Ancona, MD 04/15/2017, 11:59 AM  VAD Team --- VAD ISSUES ONLY--- Pager  [phone] (7am - 7am)  Advanced Heart Failure Team  Pager [phone] (M-F; 7a - 4p)  Please contact [facility] for night-coverage after hours (4p -7a ) and weekends on [email or web address]

## 2017-04-15 NOTE — Progress Notes (Signed)
[facility] Progress Note  Dalton RainwaterLarry D [name] 62 y.o. 1955/12/22   Subjective: Reports one episode of nausea early this morning before breakfast. Denies vomiting. Tolerating solid food and eating better. Intermittent abdominal pain.  Objective: Vital signs: Vitals:   04/15/17 1233 04/15/17 1539  BP: 90/79   Pulse: 77 81  Resp: 16 13  Temp: 98.6 F (37 C) 98.7 F (37.1 C)  SpO2: 97% 94%    Physical Exam: Gen: lethargic, well-nourished, no acute distress  HEENT: anicteric sclera CV: RRR Chest: CTA B Abd: epigastric tenderness with minimal guarding, otherwise nontender, soft, nondistended, +BS Ext: no edema  Lab Results: Recent Labs    04/14/17 0244 04/15/17 0626  NA 134* 134*  K 3.6 3.7  CL 96* 95*  CO2 28 28  GLUCOSE 115* 107*  BUN 10 11  CREATININE 1.19 1.20  CALCIUM 8.7* 8.6*   No results for input(s): AST, ALT, ALKPHOS, BILITOT, PROT, ALBUMIN in the last 72 hours. Recent Labs    04/14/17 0244 04/15/17 0626  WBC 7.2 6.7  HGB 12.5* 12.0*  HCT 40.0 38.0*  MCV 85.1 85.4  PLT 145* 135*      Assessment/Plan: Resolving N/V on prokinetics, Reglan. Tolerating low residue diet. Change to PO Reglan QID at discharge and continue for 2 weeks and then stop. Complete 2 weeks of Carafate at discharge. Continue PPI BID for 1 month and then QD. F/U with Dr. Marca AnconaKarki in 3-4 weeks. Will sign off. Call if questions.   [name] C. 04/15/2017, 4:26 PM  Pager [phone]  AFTER 5 PM or on weekends please call [phone] ID: Dalton RainwaterLarry D [name], male   DOB: 1955/12/22, 62 y.o.   MRN: [id number]

## 2017-04-16 LAB — GLUCOSE, CAPILLARY
GLUCOSE-CAPILLARY: 87 mg/dL (ref 65–99)
Glucose-Capillary: 110 mg/dL — ABNORMAL HIGH (ref 65–99)

## 2017-04-16 LAB — CBC
HCT: 38.4 % — ABNORMAL LOW (ref 39.0–52.0)
Hemoglobin: 12.5 g/dL — ABNORMAL LOW (ref 13.0–17.0)
MCH: 27.8 pg (ref 26.0–34.0)
MCHC: 32.6 g/dL (ref 30.0–36.0)
MCV: 85.3 fL (ref 78.0–100.0)
PLATELETS: 140 10*3/uL — AB (ref 150–400)
RBC: 4.5 MIL/uL (ref 4.22–5.81)
RDW: 16.7 % — ABNORMAL HIGH (ref 11.5–15.5)
WBC: 6.6 10*3/uL (ref 4.0–10.5)

## 2017-04-16 LAB — BASIC METABOLIC PANEL
Anion gap: 10 (ref 5–15)
BUN: 10 mg/dL (ref 6–20)
CO2: 30 mmol/L (ref 22–32)
CREATININE: 1.34 mg/dL — AB (ref 0.61–1.24)
Calcium: 8.5 mg/dL — ABNORMAL LOW (ref 8.9–10.3)
Chloride: 95 mmol/L — ABNORMAL LOW (ref 101–111)
GFR, EST NON AFRICAN AMERICAN: 55 mL/min — AB (ref >60)
Glucose, Bld: 77 mg/dL (ref 65–99)
POTASSIUM: 4.2 mmol/L (ref 3.5–5.1)
SODIUM: 135 mmol/L (ref 135–145)

## 2017-04-16 LAB — LACTATE DEHYDROGENASE: LDH: 195 U/L — AB (ref 98–192)

## 2017-04-16 LAB — PROTIME-INR
INR: 2.02
PROTHROMBIN TIME: 22.7 s — AB (ref 11.4–15.2)

## 2017-04-16 MED ORDER — SPIRONOLACTONE 25 MG PO TABS: 12.5000 mg | ORAL_TABLET | Freq: Every day | ORAL | 6 refills | Status: DC

## 2017-04-16 MED ORDER — IRON 325 (65 FE) MG PO TABS: 1.0000 | ORAL_TABLET | Freq: Two times a day (BID) | ORAL | 6 refills | Status: DC

## 2017-04-16 MED ORDER — SUCRALFATE 1 G PO TABS: 1.0000 g | ORAL_TABLET | Freq: Three times a day (TID) | ORAL | 6 refills | Status: AC

## 2017-04-16 MED ORDER — TORSEMIDE 20 MG PO TABS: 40.0000 mg | ORAL_TABLET | Freq: Two times a day (BID) | ORAL | 3 refills | Status: DC

## 2017-04-16 MED ORDER — DOCUSATE SODIUM 100 MG PO CAPS: 100.0000 mg | ORAL_CAPSULE | Freq: Two times a day (BID) | ORAL | 0 refills | Status: DC

## 2017-04-16 MED ORDER — POTASSIUM CHLORIDE CRYS ER 20 MEQ PO TBCR: 40.0000 meq | EXTENDED_RELEASE_TABLET | Freq: Every day | ORAL | 3 refills | Status: DC

## 2017-04-16 MED ORDER — ATORVASTATIN CALCIUM 80 MG PO TABS: 80.0000 mg | ORAL_TABLET | Freq: Every day | ORAL | 6 refills | Status: AC

## 2017-04-16 MED ORDER — OXYCODONE-ACETAMINOPHEN 5-325 MG PO TABS: 1.0000 | ORAL_TABLET | Freq: Three times a day (TID) | ORAL | 0 refills | Status: DC | PRN

## 2017-04-16 MED ORDER — WARFARIN SODIUM 5 MG PO TABS
5.0000 mg | ORAL_TABLET | Freq: Once | ORAL | Status: AC
  Administered 2017-04-16: 5 mg via ORAL
  Filled 2017-04-16: qty 1

## 2017-04-16 MED ORDER — SPIRONOLACTONE 12.5 MG HALF TABLET
12.5000 mg | ORAL_TABLET | Freq: Every day | ORAL | Status: DC
  Administered 2017-04-16: 12.5 mg via ORAL
  Filled 2017-04-16 (×2): qty 1

## 2017-04-16 MED ORDER — LACTULOSE 10 GM/15ML PO SOLN
30.0000 g | Freq: Once | ORAL | Status: AC
  Administered 2017-04-16: 30 g via ORAL
  Filled 2017-04-16: qty 45

## 2017-04-16 MED ORDER — TRAMADOL HCL 50 MG PO TABS: 50.0000 mg | ORAL_TABLET | Freq: Four times a day (QID) | ORAL | 0 refills | Status: DC

## 2017-04-16 MED ORDER — METOCLOPRAMIDE HCL 5 MG PO TABS: 5.0000 mg | ORAL_TABLET | Freq: Three times a day (TID) | ORAL | 1 refills | Status: DC

## 2017-04-16 MED ORDER — COLLAGENASE 250 UNIT/GM EX OINT
TOPICAL_OINTMENT | Freq: Every day | CUTANEOUS | Status: DC
  Administered 2017-04-16: 1 via TOPICAL
  Filled 2017-04-16: qty 30

## 2017-04-16 MED ORDER — DOCUSATE SODIUM 100 MG PO CAPS
100.0000 mg | ORAL_CAPSULE | Freq: Two times a day (BID) | ORAL | Status: DC
  Administered 2017-04-16: 100 mg via ORAL
  Filled 2017-04-16: qty 1

## 2017-04-16 MED ORDER — LOSARTAN POTASSIUM 50 MG PO TABS: 50.0000 mg | ORAL_TABLET | Freq: Two times a day (BID) | ORAL | 6 refills | Status: DC

## 2017-04-16 MED ORDER — BISACODYL 5 MG PO TBEC
10.0000 mg | DELAYED_RELEASE_TABLET | Freq: Once | ORAL | Status: AC
  Administered 2017-04-16: 10 mg via ORAL
  Filled 2017-04-16: qty 2

## 2017-04-16 NOTE — Care Management Note (Signed)
Case Management Note  Patient Details  Name: Lynda RainwaterLarry D Beougher MRN: [id number] Date of Birth: [birth year]  Subjective/Objective:    Pt admitted with syncopal episode.and subsequent burst fracture - hx of HVAD                   Action/Plan:  PTA independent from home with dad.  Pt states his dad is his primary care giver and will be with him at discharge unless he needs to run errands - per pt his dad is always accessible.  CM informed OT and HF team that pt will not have 24 hour supervision and that pt is refusing SNF /CIR Rehab.  OT communicated to CM that pt could safely discharge home with intermittent supervision and will change recommendation in epic.  doffered HH choice - [facility] - agency contacted and referral accepted.   Expected Discharge Date:  04/16/17               Expected Discharge Plan:  Home/Self Care  In-House Referral:     Discharge planning Services  CM Consult  Post Acute Care Choice:    Choice offered to:  Patient  DME Arranged:    DME Agency:     HH Arranged:  PT, OT HH Agency:  [facility]  Status of Service:  Completed, signed off  If discussed at Long Length of Stay Meetings, dates discussed:    Additional Comments:  Cherylann ParrClaxton, Donoven Pett S, RN 04/16/2017, 2:17 PM

## 2017-04-16 NOTE — Plan of Care (Signed)
Pt met all care plan, pt going home with father. Aware of don on and off of tlso brace.

## 2017-04-16 NOTE — Evaluation (Signed)
Occupational Therapy Evaluation Patient Details Name: Dalton RainwaterLarry D [name] MRN: [id number] DOB: July 06, 1955 Today's Date: 04/16/2017    History of Present Illness Pt is a 62 y/o male admitted secondary to sustaining a fall due to a syncopal episode. Pt found to have an L1 burst fx. 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    Clinical Impression   Pt admitted with above. He demonstrates the below listed deficits and will benefit from continued OT to maximize safety and independence with BADLs.  Pt presents to OT with generalized weakness, pain, decreased awareness of precautions, decreased activity tolerance.  He currently requires mod A - max A for ADLs and min guard assist for functional transfers.  [employer] orthotists in room.  Worked with them, and VAD coordinator to modify brace and problem solve through workable solutions for wear.  Will continue to follow.       Follow Up Recommendations  Home health OT;Supervision/Assistance - 24 hour    Equipment Recommendations  None recommended by OT    Recommendations for Other Services       Precautions / Restrictions Precautions Precautions: Fall;Back Precaution Booklet Issued: No Precaution Comments: old HVAD Required Braces or Orthoses: Spinal Brace Spinal Brace: Thoracolumbosacral orthotic;Applied in supine position Restrictions Weight Bearing Restrictions: No      Mobility Bed Mobility Overal bed mobility: Needs Assistance Bed Mobility: Rolling;Sidelying to Sit;Sit to Sidelying Rolling: Min guard Sidelying to sit: Min guard     Sit to sidelying: Min guard General bed mobility comments: verbal cues for safe technique   Transfers Overall transfer level: Needs assistance Equipment used: 1 person hand held assist Transfers: Sit to/from Stand Sit to Stand: Min assist         General transfer comment: min A to steady due to pain     Balance Overall balance assessment:  Needs assistance Sitting-balance support: Feet supported Sitting balance-Leahy Scale: Fair     Standing balance support: Single extremity supported Standing balance-Leahy Scale: Poor Standing balance comment: requires UE support                            ADL either performed or assessed with clinical judgement   ADL Overall ADL's : Needs assistance/impaired Eating/Feeding: Independent   Grooming: Wash/dry hands;Wash/dry face;Oral care;Brushing hair;Set up;Bed level   Upper Body Bathing: Minimal assistance;Bed level   Lower Body Bathing: Maximal assistance;Sit to/from stand   Upper Body Dressing : Moderate assistance;Bed level   Lower Body Dressing: Maximal assistance;Sit to/from stand;Bed level   Toilet Transfer: Minimal assistance;Stand-pivot;BSC   Toileting- Clothing Manipulation and Hygiene: Minimal assistance;Sit to/from stand       Functional mobility during ADLs: Minimal assistance General ADL Comments: Pt instructed in back precautions.       Vision         Perception     Praxis      Pertinent Vitals/Pain Pain Assessment: Faces Faces Pain Scale: Hurts even more Pain Location: back and abdomen Pain Descriptors / Indicators: Aching;Sore;Moaning Pain Intervention(s): Limited activity within patient's tolerance;Monitored during session;Repositioned     Hand Dominance Right   Extremity/Trunk Assessment Upper Extremity Assessment Upper Extremity Assessment: Generalized weakness   Lower Extremity Assessment Lower Extremity Assessment: Defer to PT evaluation   Cervical / Trunk Assessment Cervical / Trunk Assessment: Other exceptions Cervical / Trunk Exceptions: acute L1 burst fx    Communication Communication Communication: No difficulties  Cognition Arousal/Alertness: Awake/alert Behavior During Therapy: WFL for tasks assessed/performed Overall Cognitive Status: Within Functional Limits for tasks assessed                                  General Comments: Pt is familiar to this OT, and is cognitively intact.  He, however, is set on doing things his own way, but is able to provide rationale for why he may or may not fully adhere to suggestions    General Comments  [employer] orthotists in with pt attempting to fit TLSO.  Worked with them on problem solving through best options for modifications that will accommodate drive line.  VAD coordinator arrived to also provide input.  Built up foam applied to drive line to prevent shear edges of brace after [employer] made modifications.   Instructed pt in back precautions, and wear schedule of brace     Exercises     Shoulder Instructions      Home Living Family/patient expects to be discharged to:: Private residence Living Arrangements: Parent Available Help at Discharge: Family;Available 24 hours/day Type of Home: House Home Access: Ramped entrance     Home Layout: Able to live on main level with bedroom/bathroom     Bathroom Shower/Tub: Chief Strategy Officer: Standard Bathroom Accessibility: Yes How Accessible: Accessible via walker Home Equipment: Walker - 2 wheels;Cane - single point   Additional Comments: Pt lives with father who will be primary caregiver       Prior Functioning/Environment Level of Independence: Independent with assistive device(s)        Comments: occasionally used SPC. He reports "a couple episodes of syncope with resultant falls".   He drives and has been independent in community         OT Problem List: Decreased strength;Impaired balance (sitting and/or standing);Decreased safety awareness;Decreased knowledge of use of DME or AE;Decreased knowledge of precautions;Cardiopulmonary status limiting activity;Pain      OT Treatment/Interventions: Self-care/ADL training;DME and/or AE instruction;Therapeutic activities;Patient/family education;Balance training    OT Goals(Current goals can be found in the care plan  section) Acute Rehab OT Goals Patient Stated Goal: To get back to normal ASAP  OT Goal Formulation: With patient Time For Goal Achievement: 04/30/17 Potential to Achieve Goals: Good ADL Goals Pt Will Perform Grooming: (P) with min guard assist;standing Pt Will Perform Upper Body Bathing: (P) with min assist;bed level Pt Will Perform Lower Body Bathing: (P) with mod assist;sit to/from stand Pt Will Perform Upper Body Dressing: (P) with min assist;bed level Pt Will Perform Lower Body Dressing: (P) with min assist;sit to/from stand;with adaptive equipment Pt Will Transfer to Toilet: (P) with min guard assist;ambulating;bedside commode;grab bars;regular height toilet Pt Will Perform Toileting - Clothing Manipulation and hygiene: (P) with min guard assist;sit to/from stand Additional ADL Goal #1: (P) Pt's caregiver will be independent with donning TLSO  OT Frequency:     Barriers to D/C:            Co-evaluation              AM-PAC PT "6 Clicks" Daily Activity     Outcome Measure Help from another person eating meals?: None Help from another person taking care of personal grooming?: None Help from another person toileting, which includes using toliet, bedpan, or urinal?: A Lot Help from another person bathing (including washing, rinsing, drying)?: A Lot Help from another person to put on and taking  off regular upper body clothing?: A Lot Help from another person to put on and taking off regular lower body clothing?: A Lot 6 Click Score: 16   End of Session Equipment Utilized During Treatment: Back brace Nurse Communication: Mobility status  Activity Tolerance: Patient tolerated treatment well Patient left: in bed;with call bell/phone within reach;with nursing/sitter in room  OT Visit Diagnosis: Unsteadiness on feet (R26.81);Pain Pain - part of body: (back )                Time: 6578-4696 OT Time Calculation (min): 35 min Charges:  OT General Charges $OT Visit: 1 Visit OT  Evaluation $OT Eval Moderate Complexity: 1 Mod OT Treatments $Self Care/Home Management : 8-22 mins G-Codes:     [employer], OTR/L [phone]   Jeani Hawking M 04/16/2017, 1:34 PM

## 2017-04-16 NOTE — Plan of Care (Signed)
Continue current care plan 

## 2017-04-16 NOTE — Progress Notes (Addendum)
Patient ID: [name], male   DOB: 1955-06-02, 62 y.o.   MRN: [id number]   Advanced Heart Failure VAD Team Note  PCP-Cardiologist: Shirlee Latch  Subjective:    Neurosurgeon consulted---CT spine: L1 burst fracture. Need help fitting back brace.   GI consulted---carafate added, Protonix to bid.  He is on Reglan.    Ramp echo (4/12), speed decreased to 2700 rpm.   Having "gas pains" this morning.  Back slowly improving, has been able to get up and walk.  Still not able to get back brace fitted.  Someone else is apparently coming in today to try.   No dyspnea, back on torsemide. MAP 80s-90s.   LVAD INTERROGATION:  [employer] Flow 4.4, speed 2700 rpm, power 4.7 W.   No further alarms  Objective:    Vital Signs:   Temp:  [97.9 F (36.6 C)-98.8 F (37.1 C)] 97.9 F (36.6 C) (04/15 0722) Pulse Rate:  [74-81] 80 (04/15 0722) Resp:  [12-16] 12 (04/15 0722) BP: (90-106)/(79-92) 106/85 (04/15 0722) SpO2:  [94 %-100 %] 97 % (04/15 0722) Weight:  [234 lb 12.6 oz (106.5 kg)] 234 lb 12.6 oz (106.5 kg) (04/15 0735) Last BM Date: 04/11/17 Mean arterial Pressure 80s-90s  Intake/Output:   Intake/Output Summary (Last 24 hours) at 04/16/2017 0920 Last data filed at 04/16/2017 0900 Gross per 24 hour  Intake 940 ml  Output 1500 ml  Net -560 ml     Physical Exam    Physical Exam: General: NAD Neck: JVP 8 cm, no thyromegaly or thyroid nodule.  Lungs: Clear to auscultation bilaterally with normal respiratory effort. CV: Nondisplaced PMI.  Heart regular S1/S2, no S3/S4, no murmur.  Trace ankle edema.  Abdomen: Soft, nontender, no hepatosplenomegaly, no distention.  Skin: Intact without lesions or rashes.  Neurologic: Alert and oriented x 3.  Psych: Normal affect. Extremities: No clubbing or cyanosis. Ulcer on toe.  HEENT: Normal.   Telemetry   NSR 70s personally reviewed.   Labs   Basic Metabolic Panel: Recent Labs  Lab 04/12/17 0234 04/13/17 0257 04/14/17 0244 04/15/17 0626  04/16/17 0239  NA 138 137 134* 134* 135  K 3.9 3.3* 3.6 3.7 4.2  CL 99* 97* 96* 95* 95*  CO2 27 29 28 28 30   GLUCOSE 168* 90 115* 107* 77  BUN 13 11 10 11 10   CREATININE 1.35* 1.17 1.19 1.20 1.34*  CALCIUM 8.8* 8.7* 8.7* 8.6* 8.5*    Liver Function Tests: No results for input(s): AST, ALT, ALKPHOS, BILITOT, PROT, ALBUMIN in the last 168 hours. No results for input(s): LIPASE, AMYLASE in the last 168 hours. No results for input(s): AMMONIA in the last 168 hours.  CBC: Recent Labs  Lab 04/11/17 1941 04/12/17 0234 04/13/17 0257 04/14/17 0244 04/15/17 0626 04/16/17 0239  WBC 10.2 9.5 8.1 7.2 6.7 6.6  NEUTROABS 9.2*  --   --   --   --   --   HGB 14.3 13.6 12.9* 12.5* 12.0* 12.5*  HCT 43.3 42.1 40.5 40.0 38.0* 38.4*  MCV 83.8 84.9 86.0 85.1 85.4 85.3  PLT 147* 150 142* 145* 135* 140*    INR: Recent Labs  Lab 04/12/17 0234 04/13/17 0257 04/14/17 0244 04/15/17 0626 04/16/17 0239  INR 3.02 2.99 2.65 2.21 2.02    Other results:  EKG:    Imaging   No results found.   Medications:     Scheduled Medications: . atorvastatin  80 mg Oral Daily  . danazol  100 mg Oral BID  . ezetimibe  10 mg Oral Daily  . ferrous sulfate  325 mg Oral BID  . insulin aspart  0-15 Units Subcutaneous TID WC  . insulin aspart  0-5 Units Subcutaneous QHS  . insulin aspart protamine- aspart  12 Units Subcutaneous BID WC  . losartan  50 mg Oral BID  . metoCLOPramide (REGLAN) injection  5 mg Intravenous Q8H  . pantoprazole  40 mg Oral BID  . potassium chloride  40 mEq Oral Daily  . sildenafil  20 mg Oral BID  . spironolactone  12.5 mg Oral Daily  . sucralfate  1 g Oral TID WC & HS  . torsemide  40 mg Oral BID  . traMADol  50 mg Oral Q6H  . Warfarin - Pharmacist Dosing Inpatient   Does not apply q1800    Infusions:   PRN Medications: acetaminophen, fentaNYL (SUBLIMAZE) injection, hydrALAZINE, ondansetron (ZOFRAN) IV, oxyCODONE-acetaminophen   Patient Profile   62  y.o.smoker with history of PAD, CAD, and ischemic cardiomyopathy now s/p HVAD. Admitted with a fall.   Assessment/Plan:    1. Syncope: This took place in the setting of increased low flow and suction alarms since 4/4, then episode of dry heaving followed by urination directly proceeded syncope.  Review of log files suggests increased afterload or decreased preload state since around 4/4.  He had been out of losartan and had been taking torsemide 60 mg bid.  Possible high afterload with elevated MAP + some degree of dehydration followed by vagal event from dry heaving/nausea + urination.  Speed turned down to 2800 on admit. Had ramp echo with speed turned down to 2700.  - No further suction events on HVAD interrogation.  2. L1 burst fracture: S/p syncopal episode.  Pain improving. Able to walk some.  - Neurosurgery appreciated.  Followup with Dr. Lovell SheehanJenkins in 6 wks.  - Has back brace but having difficulty getting it on.  Unable to fit him yesterday, a different person apparently is going to come in today to try to fit him.   3. Chronic systolic CHF: Ischemic cardiomyopathy. Echo in 3/18 with EF 20-25%.[employer] ICD. He is now s/p HVAD placement. Lavare cycle is on.Felt to have degree of RV failure and will not tolerate CVP down to normal range.  No evidence for pump thrombosis.  Torsemide restarted, volume looks ok.  - Continue torsemide 40 mg bid with KCl 40 daily.   - MAP still in 90s at times, need good control with HVAD.  Will add spironolactone 12.5 mg daily.      - Ramp echo completed with speed dropped to 2700 - He is going to think about whether he would eventually want to have transplant evaluation. Will have to stop smoking.  4. HTN: Continue losartan, add spironolactone as above.    5. CAD: 3 vessel CAD, s/p stent to proximal LAD and PTCA to subtotally occluded large OM1 in 1/18. - Now on warfarin. ASA stopped with GI bleeding.  - Continue atorvastatin 80 mg daily.  6. PAD: Stable  claudication, notes with moderate walking on left. Left fem-pop bypass occluded. No pedal ulcerations. Medical management per Dr. Edilia Boickson.  - He has an ulceration on a toe, will ask wound care to evaluate.  7. Carotid stenosis: Followed at [facility].  8. R Pleural effusion: PleurX catheter in past.  9. RV failure: Continue Revatio 20 mg tid.  10. 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 ASA was decreased to 81 daily. Warfarin INR goal is now 2-2.5. He  started monthly octreotide injections but had profuse diarrhea with octreotide after 1st injection and after 2nd injection with decreased dose. He is tolerating danazol without problems. 12/18 had more melena in setting of high INR =>due to vigorous epistaxis versus AVM bleeding. ASA was stopped. Hgb stable.  - He will stay off ASA 81 and will continue INR goal 2-2.5 for now. - Continue danazol.  11. Nausea/dry heaves: Chronic, occurs frequently in the mornings.  GI saw this admission, possibly bile reflux versus diabetic gastroparesis. Abdominal US did not show acute cholecystitis. Nausea is now better but having "gas pains" and constipated.  - Carafate added, Protonix to bid.  - Continue Reglan => po for home.  - Per GI, can hold off on erythromycin.  - Diet changed to low residue, low fiber, and small frequent meals.  - Add laxative today for constipation.   He can go home today after brace is fitted.  Needs GI followup (Dr Marca Ancona) in 3-4 weeks, neurosurgery followup Lovell Sheehan) in 5 wks.  Needs followup in LVAD clinic.  Meds for home: losartan 50 mg bid, spironolactone 12.5 daily, torsemide 40 bid, danazol 100 bid, atorvastatin 80 daily, Zetia 10 daily, Reglan 5 mg tid qac x 2 wks, Carafate at current dose x 2 wks, Protonix bid x 1 month then qd, sildenafil 20 tid, KCl 40 daily, Colace 100 mg bid.   I reviewed the LVAD parameters from today, and compared the results to the patient's prior recorded data.  No programming changes were made.   The LVAD is functioning within specified parameters.  The patient performs LVAD self-test daily.  LVAD interrogation was negative for any significant power changes, alarms or PI events/speed drops.  LVAD equipment check completed and is in good working order.  Back-up equipment present.   LVAD education done on emergency procedures and precautions and reviewed exit site care.  Length of Stay: 5  Marca Ancona, MD 04/16/2017, 9:20 AM  VAD Team --- VAD ISSUES ONLY--- Pager [phone] (7am - 7am)  Advanced Heart Failure Team  Pager [phone] (M-F; 7a - 4p)  Please contact [facility] for night-coverage after hours (4p -7a ) and weekends on [email or web address]

## 2017-04-16 NOTE — Progress Notes (Signed)
PT Cancellation Note  Patient Details Name: Dalton RainwaterLarry D Cofer MRN: [id number] DOB: July 11, 1955   Cancelled Treatment:    Reason Eval/Treat Not Completed: Other (comment). Pt awaiting for [employer] to customize TLSO for drive line of HVAD. OT then working with [employer] and LVAD co-ordinators to problem solve optimal support for L1 burst fracture but not compress LVAD drive line. PT to return as able.  Lewis ShockAshly Bridget [place], PT, DPT Pager #: [phone] Office #: [phone]    Rozell Searingshly M Jeryl Umholtz 04/16/2017, 1:09 PM

## 2017-04-16 NOTE — Progress Notes (Signed)
Occupational Therapy Progress Note  Instructed pt in safe technique for UB and LB ADLs as well as donning brace.  He was too fatigued to attempt, and will require assist from father to complete ADLs safely.  Father to arrive at 1500.  OT will return at that time for further education.      04/16/17 1500  OT Visit Information  Last OT Received On 04/16/17  Assistance Needed +1  History of Present Illness Pt is a 62 y/o male admitted secondary to sustaining a fall due to a syncopal episode. Pt found to have an L1 burst fx. 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   Precautions  Precautions Fall;Back  Precaution Booklet Issued No  Precaution Comments old HVAD  Required Braces or Orthoses Spinal Brace  Spinal Brace TLSO;Applied in supine position  Pain Assessment  Faces Pain Scale 6  Pain Location back and abdomen  Pain Descriptors / Indicators Aching;Sore;Moaning  Cognition  Arousal/Alertness Awake/alert  Behavior During Therapy WFL for tasks assessed/performed  Overall Cognitive Status Within Functional Limits for tasks assessed  General Comments Pt is familiar to this OT, and is cognitively intact.  He, however, is set on doing things his own way, but is able to provide rationale for why he may or may not fully adhere to suggestions   ADL  Overall ADL's  Needs assistance/impaired  Eating/Feeding Independent  Upper Body Bathing Details (indicate cue type and reason) Pt fatigued after fit from [employer].  Instructed him on precautions and need to be in bed for UB bathing.  He initially states "go ahead and tell me what you need to, but you know I won't do that".  Explained precautions and risks of disregarding precautions, He acknowledged the risks and egaged further in discussion of how he might perform UB ADLs while flat   Upper Body Dressing Details (indicate cue type and reason) Discussed need to don shirt while supine.  Instructed him  to wear t-shirt between brace and skin, and to slit the side of T-shirt, to allow for drive line exit in the brace.   Functional mobility during ADLs Minimal assistance  General ADL Comments Pt too fatigued to acutally practice ADLs.  He reports his father will be here at 1500, and will be his caregiver.   Bed Mobility  Overal bed mobility Needs Assistance  Bed Mobility Rolling;Sidelying to Sit;Sit to Sidelying  Rolling Min guard  Sidelying to sit Min guard  Sit to sidelying Min guard  General bed mobility comments verbal cues for safe technique   Restrictions  Weight Bearing Restrictions No  General Comments  General comments (skin integrity, edema, etc.) Instructed pt to don brace in supine, but father will have to assist him with this due to back precautions and need to be cautious with drive line   OT - End of Session  Equipment Utilized During Treatment Back brace  Activity Tolerance Patient tolerated treatment well  Patient left in bed;with call bell/phone within reach;with nursing/sitter in room  Nurse Communication Mobility status  OT Assessment/Plan  OT Visit Diagnosis Unsteadiness on feet (R26.81);Pain  Pain - part of body  (back )  Follow Up Recommendations Home health OT;Supervision/Assistance - 24 hour  OT Equipment None recommended by OT  AM-PAC OT "6 Clicks" Daily Activity Outcome Measure  Help from another person eating meals? 4  Help from another person taking care of personal grooming? 4  Help from another person  toileting, which includes using toliet, bedpan, or urinal? 2  Help from another person bathing (including washing, rinsing, drying)? 2  Help from another person to put on and taking off regular upper body clothing? 2  Help from another person to put on and taking off regular lower body clothing? 2  6 Click Score 16  ADL G Code Conversion CK  OT Goal Progression  Progress towards OT goals Progressing toward goals  OT Time Calculation  OT Start Time  (ACUTE ONLY) 1225  OT Stop Time (ACUTE ONLY) 1246  OT Time Calculation (min) 21 min  OT General Charges  $OT Visit 1 Visit  OT Treatments  $Self Care/Home Management  8-22 mins  [employer] American[employer]endi Myya Meenach, OTR/L [phone]

## 2017-04-16 NOTE — Consult Note (Signed)
WOC Nurse wound consult note Reason for Consult:Right second inner metatarsal pressure injury, present on admission.  Patient admits decreased sensation to bilateral feet.  Full thickness loss with 100% thin adherent slough to wound bed.  Patient with onychomycosis to toenails.  Explained that he would need to see a podiatrist for ongoing treatment Wound type:pressure injury, unstageable Pressure Injury POA: Yes Measurement: 1 cm x 0.5 cm  Wound WUJ:WJXBJYbed:yellow adherent slough Drainage (amount, consistency, odor)minimal serosanguinous  Periwound:intact Dressing procedure/placement/frequency:Cleanse wound to right second toe with NS and pat dry.  Apply Santyl to wound bed.  Cover with foam dressing to pad and protect.  Change daily.  Will not follow at this time.  Please re-consult if needed.  Maple HudsonKaren Cloe Sockwell RN BSN CWON Pager [phone]

## 2017-04-16 NOTE — Progress Notes (Signed)
Occupational Therapy Progress Note  Pt and father instructed in back precautions and safety with donning/doffing TLSO in supine.  Reviewed back precautions - hand out provided, and instructed pt in use of AE for LB ADLs.   All questions answered.      04/16/17 1700  OT Visit Information  Last OT Received On 04/16/17  Assistance Needed +1  History of Present Illness Pt is a 62 y/o male admitted secondary to sustaining a fall due to a syncopal episode. Pt found to have an L1 burst fx. 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   Precautions  Precautions Fall;Back  Precaution Booklet Issued Yes (comment)  Precaution Comments old HVAD  Required Braces or Orthoses Spinal Brace  Spinal Brace TLSO;Applied in supine position  Pain Assessment  Pain Assessment Faces  Faces Pain Scale 4  Pain Location back and abdomen  Pain Descriptors / Indicators Aching;Sore;Moaning  Cognition  Arousal/Alertness Awake/alert  Behavior During Therapy WFL for tasks assessed/performed  Overall Cognitive Status Within Functional Limits for tasks assessed  General Comments Pt is familiar to this OT, and is cognitively intact.  He, however, is set on doing things his own way, but is able to provide rationale for why he may or may not fully adhere to suggestions   ADL  Overall ADL's  Needs assistance/impaired  Eating/Feeding Independent  Lower Body Bathing Supervison/ safety;Sit to/from stand  Lower Body Bathing Details (indicate cue type and reason) Pt instructed in use of LH sponge   Upper Body Dressing Details (indicate cue type and reason) reinforced need to don shirt in supine and to cut slit on side of shirt   Lower Body Dressing Supervision/safety;Sit to/from stand;With adaptive equipment;Cueing for safety  Lower Body Dressing Details (indicate cue type and reason) Pt instructed in use of AE for LB dressing.  he requires min cues to adhere to precautions    Functional mobility during ADLs Min guard  General ADL Comments father present and instructed on back precautions, and how to safely don TLSO   Bed Mobility  Overal bed mobility Needs Assistance  Bed Mobility Rolling;Sidelying to Sit;Sit to Sidelying  Rolling Modified independent (Device/Increase time)  General bed mobility comments use of bedrail   General Comments  General comments (skin integrity, edema, etc.) father abel to safely assist pt   OT - End of Session  Equipment Utilized During Treatment Back brace  Activity Tolerance Patient tolerated treatment well  Patient left in bed;with call bell/phone within reach;with nursing/sitter in room;with family/visitor present  Nurse Communication Mobility status  OT Assessment/Plan  OT Plan Discharge plan remains appropriate  OT Visit Diagnosis Unsteadiness on feet (R26.81);Pain  Pain - part of body  (back )  OT Frequency (ACUTE ONLY) Min 2X/week  Follow Up Recommendations Home health OT;Supervision/Assistance - 24 hour  OT Equipment None recommended by OT  AM-PAC OT "6 Clicks" Daily Activity Outcome Measure  Help from another person eating meals? 4  Help from another person taking care of personal grooming? 4  Help from another person toileting, which includes using toliet, bedpan, or urinal? 2  Help from another person bathing (including washing, rinsing, drying)? 2  Help from another person to put on and taking off regular upper body clothing? 2  Help from another person to put on and taking off regular lower body clothing? 2  6 Click Score 16  ADL G Code Conversion CK  OT Goal Progression  Progress  towards OT goals Progressing toward goals  OT Time Calculation  OT Start Time (ACUTE ONLY) 1536  OT Stop Time (ACUTE ONLY) 1628  OT Time Calculation (min) 52 min  OT General Charges  $OT Visit 1 Visit  OT Treatments  $Self Care/Home Management  38-52 mins  [employer], OTR/L [phone]

## 2017-04-16 NOTE — Progress Notes (Signed)
ANTICOAGULATION CONSULT NOTE - Follow Up Consult  Pharmacy Consult for warfarin Indication: LVAD  Allergies  Allergen Reactions  . Metformin And Related Nausea And Vomiting    *Only the extended release* (does this)  . Niacin And Related Other (See Comments)    REACTION IS SIDE EFFECT "SEVERE" FLUSHING    Patient Measurements: Height: 6' (182.9 cm) Weight: 234 lb 12.6 oz (106.5 kg) IBW/kg (Calculated) : 77.6   Vital Signs: Temp: 97.9 F (36.6 C) (04/15 0722) Temp Source: Oral (04/15 0722) BP: 106/85 (04/15 0722) Pulse Rate: 80 (04/15 0722)  Labs: Recent Labs    04/14/17 0244 04/15/17 0626 04/16/17 0239  HGB 12.5* 12.0* 12.5*  HCT 40.0 38.0* 38.4*  PLT 145* 135* 140*  LABPROT 28.0* 24.4* 22.7*  INR 2.65 2.21 2.02  CREATININE 1.19 1.20 1.34*    Estimated Creatinine Clearance: 72.1 mL/min (A) (by C-G formula based on SCr of 1.34 mg/dL (H)).   Assessment: 62 yo male admitted through ED with syncope and fall. Had HVAD placed 5/18- past cardiac history CAD and CHF. Pharmacy consulted for warfarin dosing. Patient has hx of upper/lower GI bleed and has INR goal of 2-2.5.  INR 2.02 this am, continues to trend down. Hgb still slow trend down at 12.5, LDH stable, plt low stable. Noted left arm abrasions bleeding yesterday.   PTA warfarin dose: 5 mg daily  Goal of Therapy:  INR 2.0-2.5 Monitor platelets by anticoagulation protocol: Yes   Plan:  Warfarin 5 mg tonight- recommend 5 mg daily at discharge, with 2.5 mg on two days per week (maybe Wed/Fri?). Daily INR  Tad MooreJessica Rheda Kassab, Pharm D, BCPS  Clinical Pharmacist Pager [phone]  04/16/2017 12:21 PM

## 2017-04-16 NOTE — Progress Notes (Signed)
Explained and discussed discharge instruction to patient and his father, presciptions and follow up visits given.coumadin dose given prior to discharge. No complaints at this time. Going home with his father.

## 2017-04-16 NOTE — Progress Notes (Signed)
LVAD Coordinator Rounding Note:  Admitted 04/11/17 to Dr. Shirlee LatchMcLean due to recurrent syncope.  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 Maren BeachVanTrigt under Destination Therapy criteria due to current smoking status.  Pt was lying in bed. Pt states that his back feels much better but he is still having some stomach pain.  Vital signs: Temp: 97.9 HR: 80 Doppler Pressure: 102 Automatic BP:  106/85 (93) O2 Sat: 97% RA Wt: 233>232>234 lbs  HVAD Interrogation Reveals: Speed: 2700 Flow: 4.4 Power: 4.6w Peak/Trough: 6.6/2.6 Flow alarm limit: 1.5 Power alarm limit: 6.5 Hematocrit: 38 Suction alarm: on Lavare cycle: on  Drive Line:  Left abdominal sorbaview dressing dry and intact; anchor intact and accurately applied. Weekly dressings; bedside RN may change dressing has needed. Due on Monday 4/15.  Labs:  LDH trend: 248>217>200>195  INR trend: 3.0>2.99>2.02  Anticoagulation Plan: -INR Goal: 2.0 - 2.5 -ASA Dose: no ASA due to hx of GI bleed  Device: - St Jude single lead -Therapies: on at 200 bpm  Adverse Events on VAD: 05/31/16 - Controller power-up associated with pump start event. Controller change out performed.  06/01/16 - Controller pins lubricated per St Jude reps  06/17/16 - Controller power-up associated with pump start event while changing power source 06/22/16 - Controller pins re-lubricated per [employer] reps    Plan/Recommendations:   1. Pt can discharge home today. Pt has INR check on 4/18 @1100  and visit to VAD clinic on 4/22 @ 1000am. 2. Please call VAD pager with any VAD equipment or drive line issues  Carlton AdamSarah Herbert RN, VAD Coordinator 24/7 VAD Pager: [phone]

## 2017-04-16 NOTE — Discharge Summary (Signed)
Advanced Heart Failure Team  Discharge Summary   Patient ID: [name] MRN: [id number], DOB/AGE: Apr 23, 1955 62 y.o. Admit date: 04/11/2017 D/C date:     04/16/2017   Primary Discharge Diagnoses:  1.Syncope  2. L1 Burst Fracture Continue brace.  3. Chronic systolic CHF: Ischemic cardiomyopathy. Echo in 3/18 with EF 20-25%.St Jude ICD. He is now s/p HVAD  Speed 2700.   4. HTN 5. CAD 6. PAD 7. Carotid Stenosis 8. R Pleural Effusion  9. RV Failure 10. H/O Upper GI Bleed 11. Nausea.   Hospital Course:  62 y.o.smoker with history of PAD, CAD, and ischemic cardiomyopathy now s/p HVAD. Admitted with a fall/syncope. Hospital course complicated by L1 burst fracture and nausea. GI and Neuro Surgery consults. He will follow up with GI and Neuro Surgery in a few weeks. He will continue to be followed closely in the VAD clinic. Plan to check INR later this week. [facility] consulted for HHPT.  1.Syncope: This took place in the setting of increased low flow and suction alarms since 4/4, then episode of dry heaving followed by urination directly proceeded syncope.  Review of log files suggests increased afterload or decreased preload state since around 4/4.  He had been out of losartan and had been taking torsemide 60 mg bid.  Possible high afterload with elevated MAP + some degree of dehydration followed by vagal event from dry heaving/nausea + urination.  Speed turned down to 2800 on admit. Had ramp echo with speed turned down to 2700.  - No further suction events on HVAD interrogation.  2. L1 burst fracture: S/p syncopal episode.  Pain improving. Able to walk some.  - Neurosurgery appreciated.  Followup with Dr. Arnoldo Morale in 6 wks.  - Plan for back brace that has been modified to accommodate driveline.   3. Chronic systolic CHF: Ischemic cardiomyopathy. Echo in 3/18 with EF 20-25%.St Jude ICD. He is now s/p HVAD placement. Lavare cycle is on.Felt to havedegree of RV failure and will not  tolerate CVP down to normal range.  No evidence for pump thrombosis.  Torsemide restarted, volume looks ok.  - Continue torsemide 40 mg bid with KCl 40 daily.   - MAP still in 90s at times, need good control with HVAD.  Will add spironolactone 12.5 mg daily.      - Ramp echo completed with speed dropped to 2700 - He is going to think about whether he would eventually want to have transplant evaluation. Will have to stop smoking. 4. HTN: Continue losartan, add spironolactone as above.    5. CAD: 3 vessel CAD, s/p stent to proximal LAD and PTCA to subtotally occluded large OM1 in 1/18. - Now on warfarin. ASA stopped with GI bleeding.  - Continue atorvastatin 80 mg daily. 6. PAD: Stable claudication, notes with moderate walking on left. Left fem-pop bypass occluded. No pedal ulcerations. Medical management per Dr. Scot Dock. - He has an ulceration on a toe.   7. Carotid stenosis: Followed at [facility]. 8. R Pleural effusion: PleurX catheter in past. 9. RV failure:Continue Revatio 20 mg tid.  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 ASA was decreased to 81 daily. Warfarin INR goal is now 2-2.5. He started monthly octreotide injections but had profuse diarrhea with octreotide after 1st injection and after 2nd injection with decreased dose. He is tolerating danazol without problems. 12/18 had more melena in setting of high INR =>due to vigorous epistaxis versus AVM bleeding. ASA was stopped. Hgb stable.  - He will stay  ofSA 81 and will continue INR goal 2-2.5 for now. - Continue danazol. 11. Nausea/dry heaf Av:es Chronic, occurs frequently in the mornings.  GI saw, possibly bile reflux versus diabetic gastroparesis. Abdominal US did noe cholecystitis. .  - Carafate added, Protonix to bid.  - Continue Reglan => po for home.  - Per GI, can hold off on erythromycin.  - Diet changed to low residue, low fiber, and small frequent meals.  - GI follow has been arranged.   Heartware Flow 4.4, speed 2700 rpm, power 4.7 W.   No further alarms   Discharge Weight: 234 pounds.  Discharge Vitals: Blood pressure (!) 113/93, pulse 80, temperature 98 F (36.7 C), temperature source Oral, resp. rate 13, height 6' (1.829 m), weight 234 lb 12.6 oz (106.5 kg), SpO2 98 %.  Labs: Lab Results  Component Value Date   WBC 6.6 04/16/2017   HGB 12.5 (L) 04/16/2017   HCT 38.4 (L) 04/16/2017   MCV 85.3 04/16/2017   PLT 140 (L) 04/16/2017    Recent Labs  Lab 04/16/17 0239  NA 135  K 4.2  CL 95*  CO2 30  BUN 10  CREATININE 1.34*  CALCIUM 8.5*  GLUCOSE 77   Lab Results  Component Value Date   CHOL 103 03/10/2016   HDL 49 03/10/2016   LDLCALC 45 03/10/2016   TRIG 43 03/10/2016   BNP (last 3 results) Recent Labs    05/24/16 1120 05/31/16 0228 06/06/16 0016  BNP 374.5* 270.0* 398.6*    ProBNP (last 3 results) No results for input(s): PROBNP in the last 8760 hours.   Diagnostic Studies/Procedures   No results found.  Discharge Medications   Allergies as of 04/16/2017      Reactions   Metformin And Related Nausea And Vomiting   *Only the extended release* (does this)   Niacin And Related Other (See Comments)   REACTION IS SIDE EFFECT "SEVERE" FLUSHING      Medication List    TAKE these medications   atorvastatin 80 MG tablet Commonly known as:  LIPITOR Take 1 tablet (80 mg total) by mouth daily. Start taking on:  04/17/2017 What changed:    medication strength  how much to take   blood glucose meter kit and supplies Ultra Blue Kit Dispense based on patient and insurance preference. Use up to four times daily as directed. (FOR ICD-9 250.00, 250.01).   danazol 100 MG capsule Commonly known as:  DANOCRINE Take 1 capsule (100 mg total) by mouth 2 (two) times daily.   docusate sodium 100 MG capsule Commonly known as:  COLACE Take 1 capsule (100 mg total) by mouth 2 (two) times daily.   ezetimibe 10 MG tablet Commonly known as:   ZETIA Take 1 tablet (10 mg total) by mouth daily.   insulin NPH-regular Human (70-30) 100 UNIT/ML injection Commonly known as:  NOVOLIN 70/30 Inject 12 Units into the skin 3 (three) times daily.   Iron 325 (65 Fe) MG Tabs Take 1 tablet (325 mg total) by mouth 2 (two) times daily.   losartan 50 MG tablet Commonly known as:  COZAAR Take 1 tablet (50 mg total) by mouth 2 (two) times daily. What changed:    medication strength  how much to take   metoCLOPramide 5 MG tablet Commonly known as:  REGLAN Take 1 tablet (5 mg total) by mouth 3 (three) times daily before meals.   oxyCODONE-acetaminophen 5-325 MG tablet Commonly known as:  PERCOCET/ROXICET Take 1-2 tablets by mouth every  8 (eight) hours as needed for moderate pain.   pantoprazole 40 MG tablet Commonly known as:  PROTONIX Take 1 tablet (40 mg total) by mouth 2 (two) times daily.   potassium chloride SA 20 MEQ tablet Commonly known as:  K-DUR,KLOR-CON Take 2 tablets (40 mEq total) by mouth daily. What changed:  when to take this   sildenafil 20 MG tablet Commonly known as:  REVATIO Take 1 tablet (20 mg total) by mouth 3 (three) times daily. What changed:  when to take this   spironolactone 25 MG tablet Commonly known as:  ALDACTONE Take 0.5 tablets (12.5 mg total) by mouth daily. Start taking on:  04/17/2017   sucralfate 1 g tablet Commonly known as:  CARAFATE Take 1 tablet (1 g total) by mouth 4 (four) times daily -  with meals and at bedtime.   torsemide 20 MG tablet Commonly known as:  DEMADEX Take 2 tablets (40 mg total) by mouth 2 (two) times daily. What changed:  how much to take   traMADol 50 MG tablet Commonly known as:  ULTRAM Take 1 tablet (50 mg total) by mouth every 6 (six) hours.   warfarin 5 MG tablet Commonly known as:  COUMADIN Take as directed. If you are unsure how to take this medication, talk to your nurse or doctor. Original instructions:  Take 1 tablet (5 mg total) by mouth daily.             Durable Medical Equipment  (From admission, onward)        Start     Ordered   04/16/17 1235  Heart failure home health orders  (Heart failure home health orders / Face to face)  Once    Comments:  Heart Failure Follow-up Care:  Verify follow-up appointments per Patient Discharge Instructions. Confirm transportation arranged. Reconcile home medications with discharge medication list. Remove discontinued medications from use. Assist patient/caregiver to manage medications using pill box. Reinforce low sodium food selection Assessments: Vital signs and oxygen saturation at each visit. Assess home environment for safety concerns, caregiver support and availability of low-sodium foods. Consult Education officer, museum, PT/OT, Dietitian, and CNA based on assessments. Perform comprehensive cardiopulmonary assessment. Notify MD for any change in condition or weight gain of 3 pounds in one day or 5 pounds in one week with symptoms. Daily Weights and Symptom Monitoring: Ensure patient has access to scales. Teach patient/caregiver to weigh daily before breakfast and after voiding using same scale and record.    Teach patient/caregiver to track weight and symptoms and when to notify Provider. Activity: Develop individualized activity plan with patient/caregiver.   HHPT/HHOT  Question Answer Comment  Heart Failure Follow-up Care Advanced Heart Failure (AHF) Clinic at [phone]   Lab frequency Other see comments   Fax lab results to Other see comments   Diet Low Sodium Heart Healthy   Fluid restrictions: 2000 mL Fluid   Initiate Heart Failure Clinic Diuretic Protocol to be used by [place] only ( to be ordered by Heart Failure Team Providers Only) No      04/16/17 1236      Disposition   The patient will be discharged in stable condition to home. Discharge Instructions    (HEART FAILURE PATIENTS) Call MD:  Anytime you have any of the following symptoms: 1) 3 pound  weight gain in 24 hours or 5 pounds in 1 week 2) shortness of breath, with or without a dry hacking cough 3) swelling in the hands, feet or  stomach 4) if you have to sleep on extra pillows at night in order to breathe.   Complete by:  As directed    Diet - low sodium heart healthy   Complete by:  As directed    Heart Failure patients record your daily weight using the same scale at the same time of day   Complete by:  As directed    Increase activity slowly   Complete by:  As directed    Page VAD Coordinator at [phone]  Notify for: any VAD alarms, sustained elevations of power >10 watts, sustained drop in Pulse Index <3   Complete by:  As directed    Notify for:   any VAD alarms sustained elevations of power >10 watts sustained drop in Pulse Index <3     Speed Settings:   Complete by:  As directed    Fixed 2700 RPM     Follow-up Information    Larey Dresser, MD Follow up on 04/19/2017.   Specialty:  Cardiology Why:  For lab work . Page Judson Roch when you arrive Contact information: [address] [place] [address] [phone]        Newman Pies, MD Follow up on 05/18/2017.   Specialty:  Neurosurgery Why:  at 10:45 for registration and appointment at 11:15  Contact information: [place]. [address] [place] [address] [phone]        Laurence Spates, MD Follow up on 05/14/2017.   Specialty:  Gastroenterology Why:  at 12:45 Contact information: [address] [place] [address] [phone]        [facility] Follow [facility].   Specialty:  [place] Why:  physical therapy, occupational therapy Contact information: [address] [place] [address] [phone]             Duration of Discharge Encounter: Greater than 35 minutes   Signed, Tava Peery NP-C  04/16/2017, 3:04 PM

## 2017-04-16 NOTE — Progress Notes (Signed)
Patient discharged home with father- home supplies, personal belongings packed and discharge instructions reviewed. Patient insisted on walking to car without wheelchair. Prescriptions and discharge papers sent with patient.

## 2017-04-17 ENCOUNTER — Telehealth (HOSPITAL_COMMUNITY): Payer: Self-pay | Admitting: Unknown Physician Specialty

## 2017-04-17 NOTE — Telephone Encounter (Signed)
Received call from [facility] nurse stating that the pt is refusing Home care d/t a copay of $80 today and $27 a visit. Nurse stated that the pt has not met his deductible of $6500 yet, but feels this past hosptilization will meet that requirement. She states that we can try to refer him over again next week and he may not have a copay anymore. We follow up with him in clinic on Monday and will discuss this w/the pt.   Tanda Rockers RN, BSN VAD Coordinator 24/7 Pager [phone]

## 2017-04-19 ENCOUNTER — Ambulatory Visit (HOSPITAL_COMMUNITY)
Admission: RE | Admit: 2017-04-19 | Discharge: 2017-04-19 | Disposition: A | Discharge status: Home / Self Care | Payer: BLUE CROSS/BLUE SHIELD | Source: Ambulatory Visit | Attending: Internal Medicine | Admitting: Internal Medicine

## 2017-04-19 ENCOUNTER — Ambulatory Visit (HOSPITAL_COMMUNITY): Payer: Self-pay | Admitting: Pharmacist

## 2017-04-19 DIAGNOSIS — I110 Hypertensive heart disease with heart failure: Secondary | ICD-10-CM | POA: Insufficient documentation

## 2017-04-19 DIAGNOSIS — Z95811 Presence of heart assist device: Secondary | ICD-10-CM | POA: Insufficient documentation

## 2017-04-19 DIAGNOSIS — I5022 Chronic systolic (congestive) heart failure: Secondary | ICD-10-CM | POA: Insufficient documentation

## 2017-04-19 LAB — PROTIME-INR
INR: 3.29
Prothrombin Time: 33.2 seconds — ABNORMAL HIGH (ref 11.4–15.2)

## 2017-04-23 ENCOUNTER — Ambulatory Visit (HOSPITAL_COMMUNITY)
Admit: 2017-04-23 | Discharge: 2017-04-23 | Disposition: A | Discharge status: Home / Self Care | Payer: BLUE CROSS/BLUE SHIELD | Attending: Internal Medicine | Admitting: Internal Medicine

## 2017-04-23 ENCOUNTER — Ambulatory Visit (HOSPITAL_COMMUNITY): Payer: Self-pay | Admitting: Pharmacist

## 2017-04-23 ENCOUNTER — Other Ambulatory Visit (HOSPITAL_COMMUNITY): Payer: Self-pay | Admitting: Unknown Physician Specialty

## 2017-04-23 VITALS — BP 121/56 | HR 68 | Ht 72.0 in | Wt 242.6 lb

## 2017-04-23 DIAGNOSIS — I2721 Secondary pulmonary arterial hypertension: Secondary | ICD-10-CM | POA: Diagnosis not present

## 2017-04-23 DIAGNOSIS — Z48812 Encounter for surgical aftercare following surgery on the circulatory system: Secondary | ICD-10-CM | POA: Diagnosis not present

## 2017-04-23 DIAGNOSIS — Z95811 Presence of heart assist device: Secondary | ICD-10-CM

## 2017-04-23 DIAGNOSIS — Z955 Presence of coronary angioplasty implant and graft: Secondary | ICD-10-CM | POA: Insufficient documentation

## 2017-04-23 DIAGNOSIS — I110 Hypertensive heart disease with heart failure: Secondary | ICD-10-CM | POA: Insufficient documentation

## 2017-04-23 DIAGNOSIS — Z7901 Long term (current) use of anticoagulants: Secondary | ICD-10-CM

## 2017-04-23 DIAGNOSIS — F17200 Nicotine dependence, unspecified, uncomplicated: Secondary | ICD-10-CM | POA: Insufficient documentation

## 2017-04-23 DIAGNOSIS — Z7902 Long term (current) use of antithrombotics/antiplatelets: Secondary | ICD-10-CM | POA: Diagnosis not present

## 2017-04-23 DIAGNOSIS — I739 Peripheral vascular disease, unspecified: Secondary | ICD-10-CM | POA: Insufficient documentation

## 2017-04-23 DIAGNOSIS — R110 Nausea: Secondary | ICD-10-CM

## 2017-04-23 DIAGNOSIS — I252 Old myocardial infarction: Secondary | ICD-10-CM | POA: Insufficient documentation

## 2017-04-23 DIAGNOSIS — I5022 Chronic systolic (congestive) heart failure: Secondary | ICD-10-CM | POA: Diagnosis not present

## 2017-04-23 DIAGNOSIS — Z794 Long term (current) use of insulin: Secondary | ICD-10-CM | POA: Diagnosis not present

## 2017-04-23 DIAGNOSIS — J90 Pleural effusion, not elsewhere classified: Secondary | ICD-10-CM | POA: Diagnosis not present

## 2017-04-23 DIAGNOSIS — S32001D Stable burst fracture of unspecified lumbar vertebra, subsequent encounter for fracture with routine healing: Secondary | ICD-10-CM

## 2017-04-23 DIAGNOSIS — I2510 Atherosclerotic heart disease of native coronary artery without angina pectoris: Secondary | ICD-10-CM | POA: Insufficient documentation

## 2017-04-23 DIAGNOSIS — E785 Hyperlipidemia, unspecified: Secondary | ICD-10-CM | POA: Insufficient documentation

## 2017-04-23 DIAGNOSIS — J449 Chronic obstructive pulmonary disease, unspecified: Secondary | ICD-10-CM | POA: Insufficient documentation

## 2017-04-23 DIAGNOSIS — E1151 Type 2 diabetes mellitus with diabetic peripheral angiopathy without gangrene: Secondary | ICD-10-CM | POA: Diagnosis not present

## 2017-04-23 DIAGNOSIS — Z79899 Other long term (current) drug therapy: Secondary | ICD-10-CM | POA: Diagnosis not present

## 2017-04-23 LAB — BASIC METABOLIC PANEL
ANION GAP: 12 (ref 5–15)
BUN: 11 mg/dL (ref 6–20)
CALCIUM: 8.9 mg/dL (ref 8.9–10.3)
CO2: 27 mmol/L (ref 22–32)
Chloride: 94 mmol/L — ABNORMAL LOW (ref 101–111)
Creatinine, Ser: 1.32 mg/dL — ABNORMAL HIGH (ref 0.61–1.24)
GFR calc non Af Amer: 56 mL/min — ABNORMAL LOW (ref >60)
GLUCOSE: 108 mg/dL — AB (ref 65–99)
POTASSIUM: 3.3 mmol/L — AB (ref 3.5–5.1)
Sodium: 133 mmol/L — ABNORMAL LOW (ref 135–145)

## 2017-04-23 LAB — CBC
HEMATOCRIT: 41 % (ref 39.0–52.0)
Hemoglobin: 13.4 g/dL (ref 13.0–17.0)
MCH: 27.6 pg (ref 26.0–34.0)
MCHC: 32.7 g/dL (ref 30.0–36.0)
MCV: 84.4 fL (ref 78.0–100.0)
Platelets: 173 10*3/uL (ref 150–400)
RBC: 4.86 MIL/uL (ref 4.22–5.81)
RDW: 17.2 % — ABNORMAL HIGH (ref 11.5–15.5)
WBC: 7.2 10*3/uL (ref 4.0–10.5)

## 2017-04-23 LAB — PROTIME-INR
INR: 2.42
Prothrombin Time: 26.1 seconds — ABNORMAL HIGH (ref 11.4–15.2)

## 2017-04-23 LAB — LACTATE DEHYDROGENASE: LDH: 230 U/L — ABNORMAL HIGH (ref 98–192)

## 2017-04-23 NOTE — Progress Notes (Signed)
Patient presents for hosp f/u in VAD clinic. Reports no problems with VAD equipment or concerns with drive line. States that he feels his drive line pull sometimes under the brace. I am sending him home with extra anchors today for increased securement.   Vital Signs:  Doppler Pressure  108 Automatc BP:  121/56 (83) HR: 68 SPO2: 100  Weight:  242.6 lb w/ back brace Last weight: 243.6lb Home weights:  248 - 241 lbs  VAD Indication: Destination Therapy - current smoker  VAD interrogation & Equipment Management: Speed: 2700 Flow:  4.3 Power:  4.7 w Peak/trough:  6.2/2.7 HCT: 38 Alarm settings: 2.0/6.5 Suction: On Lavare: On    Exit Site Care: Drive line is being maintained weekly by [employer]. Drive line dressing changed using sterile technique. Drive line exit site well healed and incorporated. The velour is fully implanted at exit site. Dressing dry and intact. No erythema or drainage. Stabilization device present and accurately applied. Pt denies fever or chills. Pt given 8 weekly dressing kits and 5 anchors for home use.     Significant Events on VAD Support:  05/31/16 - Controller power-up associated with pump start event. Controller change out performed.  06/01/16 - Controller pins lubricated per St Jude reps  06/17/16 - Controller power-up associated with pump start event while changing power source 06/22/16 - Controller pins re-lubricated per [facility] reps   Device:St Jude single lead- followed at device clinic Therapies: on at 200 bpm Last check: 05/17/16   BP &Labs:  Doppler BP 108 - Doppler is reflecting modified systolic   Hgb - 28.3 no S/S of bleeding. Specifically denies melena/BRBPR or nosebleeds.  LDH 230 established baseline of 190- 250. Denies tea-colored urine. No power elevations noted on interrogation.    Patient Instructions:  1. No changes in medications 2. Return to [place] clinic at reguarly scheduled appt on May 16th.   Tanda Rockers RN, BSN VAD Coordinator   Office: [phone] 24/7 Emergency VAD Pager: [phone]      PCP: Dr. Kathyrn Lass Cardiology: Dr. Stanford Breed HF Cardiology: Dr. Aundra Dubin  62 y.o. smoker with history of PAD, CAD, and ischemic cardiomyopathy now s/p HVAD placement presents for followup of CHF and LVAD.  Patient was admitted in 1/18 with PNA and acute on chronic systolic CHF.  He was markedly volume overloaded and also had low cardiac output requiring dobutamine use.  He had a right pleural effusion and underwent thoracentesis.  Fluid studies suggested a parapneumonic effusion.  He had cardiac cath, showing 3 vessel disease and markedly elevated filling pressures, right and left.  He was evaluated for CABG but turned down given comorbidities and lack of venous conduits.  We were able to wean him off dobutamine and discharged him with plan for outpatient PCI once he had recovered from the current hospitalization.   Admitted 01/28/16 - 02/03/16 after presenting for elective 2 vessel PCI of the LAD and OM of the circumflex. RHC at same time showed advanced HF with low cardiac index and severely elevated pressures. Diuresed with IV lasix, metolazone, and milrinone, but refused PICC line to follow coox/CVP. Underwent thoracentesis 02/01/16 with 1.8 L out. Overall diuresed 16 lbs. Discharge weight 214 lbs.  He was admitted in 4/18 with low output HF and severe NYHA class IV symptoms, started on milrinone.  Unable to titrate off milrinone and is now on at home.  He had a right chronic transudative effusion, PleurX catheter was placed.    He had HVAD placed  05/30/16.  He was diuresed post-op and speed was increased to 3000.  It was later dropped to 2940.  Pleurx catheter was removed.    He was admitted in 7/18 with upper GI bleed.  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  He had 3 units PRBCs.  ASA was decreased from 325 to 81 daily and warfarin goal was decreased to 2-2.5.  Octreotide was  started but he developed profuse diarrhea that seems to have been related to octreotide.  This was stopped and he was started on danazol.   He has calf claudication on left after walking a moderate distance.  This is chronic and followed by [facility]. His left fem-pop graft has occluded, plan for medical management as symptoms are mild.  No pedal ulcerations.    At last appointment, he reported dark stool and hgb was lower.  He had been having significant epistaxis and INR was > 5.  ASA was stopped.  Since then, he has had no further epistaxis or dark stool.  Hgb has been stable, most recently 10.3.   Admitted 4/10 through 04/16/2017. Admitted for syncope thought to be the result of a vagal episode. Due to cough he had L1 Fracture . Neuro surgery consulted with back brace applied. He will follow up with Dr Arnoldo Morale in 6 weeks. Due to recurrent N/V GI consulted and he was placed on carafate and reglan.   Today he returns for post hospital follow up. Overall feeling ok. Back pain has improved some.  Denies SOB/PND/Orthopnea. Denies syncope/presyncope. Appetite ok. No fever or chills. Weight at home 235-243 pounds.  Taking all medications. No bleeding problems. His dad performs the dressing change. He refused home health follow up. He continues to use back brace when he is ambulating.   Labs (1/18): K 4.2, creatinine 0.9, hgb 12.6 Labs (2/18): K 4.2, creatinine 1.23, hgb 12.4, BNP 321 Labs (4/18): hgbA1c 8.9, TSH elevated but free T4 normal.  K 3.2, creatinine 0.8 Labs (5/18): digoxin 0.4, K 3.4, creatinine 0.9 Labs (6/18): creatinine 0.67 => 0.98, LDH 209, hgb 8.9 => 9 Labs (7/18): K 2.9, creatinine 1.02, LDL 189, hgb 9 Labs (8/18): hgb 10 => 10.2, LDH 204, K 3.2, creatinine 0.9 => 1.04, INR 2.45, LDH 204 Labs (9/18): LDH 206, INR 3.26, K 3.5, creatinine 0.91, hgb 11.3 Labs (10/18): LDH 197, hgb 11.4, K 3.1, creatinine 1.12 Labs (11/18): Hgb 10.3, K 4, creatinine 1.0, LDH 196 Labs (12/18): hgb 11 => 7.9 =>  8.6, INR 5 Labs (1/19): hgb 10.3, ferritin 62 Labs 04/23/2017: hgb 13.4 WBC 7.2  K 3.3   HVAD interrogation: See LVAD nurse's note above  PMH: 1. PAD: Bilateral fem-pop bypasses - Arterial dopplers (9/18) with occluded left fem-pop, right fem-pop patent.  2. CAD: Anterior MI 2005 => PCI to LAD followed by staged PCI to LCx and RCA.   - LHC (1/18): Diffuse up to 80% calcified stenosis throughout the proximal LAD.  Diffuse disease in distal LAD up to 50%.  Large OM1 totally occluded at the ostium but reconstituted via collaterals.  80% distal RCA stenosis involving the ostia of the PLV and PDA with about 70% stenosis. Patient had PCI with DES to proximal LAD and PTCA OM1 (unable to place stent).  3. Chronic systolic CHF: Ischemic cardiomyopathy.  St Jude ICD.  - Echo (1/18) with EF 20%, mildly decreased RV systolic function.  - RHC (1/18) with mean RA 21, PA 69/31 mean 45, mean PCWP 27, CI 2.37, PVR  3.3 WU.  - Repeat RHC (1/18) with mean RA 16, mean PCWP 21, CI 1.92 - Echo (3/18) with EF 20-25%, mild MR, mild RV dilation with normal systolic function, PASP 47 mmHg.  - 4/18 admission with low output HF and milrinone gtt started (co-ox as low as 42%).  - RHC on milrinone (4/18): mean RA 5, PA 61/24 mean 40, mean PCWP 17, CI 2.1 Fick with PVR 5.1 WU, CI 1.84 thermo with PVR 5.9 WU.  - HVAD placement 05/30/16.  4. Carotid stenosis: Carotid dopplers (8/16) with 70-35% RICA, 00-93% LICA. - Carotid dopplers (2/18) with 40-59% RICA stenosis.  5. Type II diabetes. 6. Hyperlipidemia. 7. HTN 8. COPD:  PFTs (5/18) with moderate to severe obstructive disease.  Active smoker. 9. Chronic right pleural effusion (transudative) with PleurX catheter.  10. Upper GI bleeding: 7/18, enteroscopy with 2 small bowel AVMs (treated).   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  Smokes < 5 cigs/day.  Occasional ETOH.   Family History  Problem Relation Age of Onset  . Diabetes Mother   . Coronary artery  disease Father   . Heart disease Father        before age 48   ROS: All systems reviewed and negative except as per HPI.   Current Outpatient Medications  Medication Sig Dispense Refill  . atorvastatin (LIPITOR) 80 MG tablet Take 1 tablet (80 mg total) by mouth daily. 30 tablet 6  . blood glucose meter kit and supplies Ultra Blue Kit Dispense based on patient and insurance preference. Use up to four times daily as directed. (FOR ICD-9 250.00, 250.01). 1 each 0  . danazol (DANOCRINE) 100 MG capsule Take 1 capsule (100 mg total) by mouth 2 (two) times daily. 60 capsule 6  . docusate sodium (COLACE) 100 MG capsule Take 1 capsule (100 mg total) by mouth 2 (two) times daily. 10 capsule 0  . ezetimibe (ZETIA) 10 MG tablet Take 1 tablet (10 mg total) by mouth daily. 90 tablet 3  . Ferrous Sulfate (IRON) 325 (65 Fe) MG TABS Take 1 tablet (325 mg total) by mouth 2 (two) times daily. 60 tablet 6  . insulin NPH-regular Human (NOVOLIN 70/30) (70-30) 100 UNIT/ML injection Inject 12 Units into the skin 3 (three) times daily.     Marland Kitchen losartan (COZAAR) 50 MG tablet Take 1 tablet (50 mg total) by mouth 2 (two) times daily. 60 tablet 6  . metoCLOPramide (REGLAN) 5 MG tablet Take 1 tablet (5 mg total) by mouth 3 (three) times daily before meals. 90 tablet 1  . oxyCODONE-acetaminophen (PERCOCET/ROXICET) 5-325 MG tablet Take 1-2 tablets by mouth every 8 (eight) hours as needed for moderate pain. 30 tablet 0  . pantoprazole (PROTONIX) 40 MG tablet Take 1 tablet (40 mg total) by mouth 2 (two) times daily. 60 tablet 6  . potassium chloride SA (K-DUR,KLOR-CON) 20 MEQ tablet Take 2 tablets (40 mEq total) by mouth daily. 90 tablet 3  . sildenafil (REVATIO) 20 MG tablet Take 1 tablet (20 mg total) by mouth 3 (three) times daily. (Patient taking differently: Take 20 mg by mouth 2 (two) times daily. ) 90 tablet 11  . spironolactone (ALDACTONE) 25 MG tablet Take 0.5 tablets (12.5 mg total) by mouth daily. 30 tablet 6  .  torsemide (DEMADEX) 20 MG tablet Take 2 tablets (40 mg total) by mouth 2 (two) times daily. 180 tablet 3  . traMADol (ULTRAM) 50 MG tablet Take 1 tablet (50 mg total)  by mouth every 6 (six) hours. 30 tablet 0  . sucralfate (CARAFATE) 1 g tablet Take 1 tablet (1 g total) by mouth 4 (four) times daily -  with meals and at bedtime. (Patient not taking: Reported on 04/23/2017) 120 tablet 6  . warfarin (COUMADIN) 5 MG tablet Take 5 mg (1 tablet) daily except 2.5 mg (1/2 tablet) on Monday and Friday     No current facility-administered medications for this encounter.    BP (!) 121/56 Comment: 83-map  Pulse 68   Ht 6' (1.829 m)   Wt 242 lb 9.6 oz (110 kg)   BMI 32.90 kg/m     Physical Exam: GENERAL: Walked slowly in the clinic. Back brace present.  HEENT: normal  NECK: Supple, JVP 5-6  .  2+ bilaterally, no bruits.  No lymphadenopathy or thyromegaly appreciated.   CARDIAC:  Mechanical heart sounds with LVAD hum present.  LUNGS:  Clear to auscultation bilaterally.  ABDOMEN:  Soft, round, nontender, positive bowel sounds x4.     LVAD exit site: well-healed and incorporated.  Dressing dry and intact.  No erythema or drainage.  Stabilization device present and accurately applied.  Driveline dressing is being changed daily per sterile technique. EXTREMITIES:  Warm and dry, no cyanosis, clubbing, rash or edema. R and LLE chronic hyperpigmentation.,   NEUROLOGIC:  Alert and oriented x 4.  Gait steady.  No aphasia.  No dysarthria.  Affect pleasant.    .     Wt Readings from Last 3 Encounters:  04/16/17 234 lb 12.6 oz (106.5 kg)  03/15/17 243 lb 9.6 oz (110.5 kg)  01/15/17 238 lb 12.8 oz (108.3 kg)   Assessment/Plan: 1. Chronic systolic CHF: Ischemic cardiomyopathy.  Echo in 3/18 with EF 20-25%.  He did not have much effect from 12/17 PCI in terms of symptoms or EF.  St Jude ICD.  He is now s/p HVAD placement.  Lavare cycle is on, parameters stable on device with no alarms.   NYHA II-III. Functional  class hard to determine with back pain. VAD parameters stable. LDH stable.  - Continue  volume status stable. Continue torsemide 40 mg twice a day.  - Continue losartan 50 mg twice a day.  -Check BMET today.    2. CAD: 3 vessel CAD, s/p stent to proximal LAD and PTCA to subtotally occluded large OM1 in 1/18.  - No s/s ischemia.   - He was on Plavix for > 3 months post-intervention, now on warfarin.  ASA stopped with bleeding.  - Continue atorvastatin 80 mg daily.   3. PAD: Stable claudication, notes with moderate walking on left.  Left fem-pop bypass occluded.  No pedal ulcerations.  Medical management per Dr. Scot Dock.  -4. Carotid stenosis:  Followed at [facility].  5. R Pleural effusion: PleurX catheter in past.  6. Pulmonary arterial HTN: Continue Revatio 20 mg tid. Reinfoeced taking this 3 times a day.  7. Smoking: Discussed smoking cessation.    8. Upper GI bleeding: 7/18, small bowel AVMs.  ASA was decreased to 81 daily.  Warfarin INR goal is now 2-2.5.  He started monthly octreotide injections but had profuse diarrhea with octreotide after 1st injection and after 2nd injection with decreased dose.  He is tolerating danazol without problems. 12/18 had more melena in setting of high INR => due to vigorous epistaxis versus AVM bleeding. ASA was stopped.    - He stay off ASA 81 and will continue INR goal 2-2.5 for now.   - Continue  danazol.  9. H/O Epistaxis: Recommended use of regular saline nasal spray. No issues.  10. L1 Burst Fracture Wearing back brace. Has follow up with Dr Arnoldo Morale in a few weeks.  11. Nausea/Dry heaves Has GI follow up. Continue protonix twice a day. I have asked him to start carafate again as ordered on d/c.  Continue reglan.   Check CBC, INR, BMEt today.   Follow up  4 weeks.    Dalton Knippenberg NP-C   04/23/2017

## 2017-04-23 NOTE — Progress Notes (Signed)
Patient presents for hosp f/u in VAD clinic. Reports no problems with VAD equipment or concerns with drive line. States that he feels his drive line pull sometimes under the brace. I am sending him home with extra anchors today for increased securement.   Vital Signs:  Doppler Pressure  108 Automatc BP:  121/56 (83) HR: 68 SPO2: 100  Weight:  242.6 lb w/ back brace Last weight: 243.6lb Home weights:  248 - 241 lbs  VAD Indication: Destination Therapy - current smoker  VAD interrogation & Equipment Management: Speed: 2700 Flow:  4.3 Power:  4.7 w    Peak/trough:  6.2/2.7 HCT: 38 Alarm settings: 2.0/6.5 Suction: On Lavare: On    Exit Site Care: Drive line is being maintained weekly  by [employer]. Drive line dressing changed using sterile technique. Drive line exit site well healed and incorporated. The velour is fully implanted at exit site. Dressing dry and intact. No erythema or drainage. Stabilization device present and accurately applied. Pt denies fever or chills. Pt given 8 weekly dressing kits and 5 anchors for home use.     Significant Events on VAD Support:  05/31/16 - Controller power-up associated with pump start event. Controller change out performed.  06/01/16 - Controller pins lubricated per [employer] Jude reps  06/17/16 - Controller power-up associated with pump start event while changing power source 06/22/16 - Controller pins re-lubricated per [facility] reps   Device:St Jude single lead- followed at device clinic Therapies: on at 200 bpm Last check: 05/17/16   BP & Labs:  Doppler BP 108 - Doppler is reflecting modified systolic   Hgb - 16.113.4 no S/S of bleeding. Specifically denies melena/BRBPR or nosebleeds.  LDH 230 established baseline of 190- 250. Denies tea-colored urine. No power elevations noted on interrogation.    Patient Instructions:  1. No changes in medications 2. Return to VAD clinic at reguarly scheduled appt on May 16th.   Carlton AdamSarah Tahj Njoku RN,  BSN VAD Coordinator   Office: [phone] 24/7 Emergency VAD Pager: [phone]

## 2017-05-04 ENCOUNTER — Other Ambulatory Visit (HOSPITAL_COMMUNITY): Payer: Self-pay | Admitting: Unknown Physician Specialty

## 2017-05-04 ENCOUNTER — Other Ambulatory Visit (HOSPITAL_COMMUNITY): Payer: Self-pay | Admitting: Student

## 2017-05-04 ENCOUNTER — Other Ambulatory Visit (HOSPITAL_COMMUNITY): Payer: Self-pay | Admitting: *Deleted

## 2017-05-04 DIAGNOSIS — I5023 Acute on chronic systolic (congestive) heart failure: Secondary | ICD-10-CM

## 2017-05-04 DIAGNOSIS — I509 Heart failure, unspecified: Secondary | ICD-10-CM

## 2017-05-04 DIAGNOSIS — Z7901 Long term (current) use of anticoagulants: Secondary | ICD-10-CM

## 2017-05-04 DIAGNOSIS — Z95810 Presence of automatic (implantable) cardiac defibrillator: Secondary | ICD-10-CM

## 2017-05-04 DIAGNOSIS — Z95811 Presence of heart assist device: Secondary | ICD-10-CM

## 2017-05-04 DIAGNOSIS — S32001D Stable burst fracture of unspecified lumbar vertebra, subsequent encounter for fracture with routine healing: Secondary | ICD-10-CM

## 2017-05-04 MED ORDER — EZETIMIBE 10 MG PO TABS: 10.0000 mg | ORAL_TABLET | Freq: Every day | ORAL | 3 refills | Status: AC

## 2017-05-04 MED ORDER — TRAMADOL HCL 50 MG PO TABS: 50.0000 mg | ORAL_TABLET | Freq: Four times a day (QID) | ORAL | 0 refills | Status: DC | PRN

## 2017-05-04 MED ORDER — OXYCODONE-ACETAMINOPHEN 5-325 MG PO TABS: 1.0000 | ORAL_TABLET | Freq: Three times a day (TID) | ORAL | 0 refills | Status: DC | PRN

## 2017-05-04 MED ORDER — PANTOPRAZOLE SODIUM 40 MG PO TBEC: 40.0000 mg | DELAYED_RELEASE_TABLET | Freq: Two times a day (BID) | ORAL | 6 refills | Status: DC

## 2017-05-04 MED ORDER — WARFARIN SODIUM 5 MG PO TABS: 5.0000 mg | ORAL_TABLET | Freq: Every day | ORAL | 3 refills | Status: DC

## 2017-05-04 NOTE — Progress Notes (Signed)
[place] controlled substance reporting system personally reviewed.  Pt had 30 tabs 5/325 Oxycodone and 28 tabs 50 mg Tramadol prescribed 04/16/17.  These lasted twice as long as recommended. Pain secondary to recent Back fracture.    Follows up with Ortho 05/23/17. Discussed with supervising MD. Will refill to bridge to ortho appointment.     Casimiro Needle [address] [place], PA-C 05/04/2017 10:15 AM

## 2017-05-07 ENCOUNTER — Ambulatory Visit (HOSPITAL_COMMUNITY)
Admission: RE | Admit: 2017-05-07 | Discharge: 2017-05-07 | Disposition: A | Discharge status: Home / Self Care | Payer: BLUE CROSS/BLUE SHIELD | Source: Ambulatory Visit | Attending: Cardiology | Admitting: Cardiology

## 2017-05-07 DIAGNOSIS — Z7901 Long term (current) use of anticoagulants: Secondary | ICD-10-CM | POA: Diagnosis not present

## 2017-05-07 DIAGNOSIS — Z95811 Presence of heart assist device: Secondary | ICD-10-CM | POA: Diagnosis not present

## 2017-05-07 LAB — PROTIME-INR
INR: 1.69
PROTHROMBIN TIME: 19.8 s — AB (ref 11.4–15.2)

## 2017-05-08 ENCOUNTER — Ambulatory Visit (HOSPITAL_COMMUNITY): Payer: Self-pay | Admitting: Pharmacist

## 2017-05-08 DIAGNOSIS — Z95811 Presence of heart assist device: Secondary | ICD-10-CM

## 2017-05-14 ENCOUNTER — Encounter: Payer: Self-pay | Admitting: Internal Medicine

## 2017-05-14 ENCOUNTER — Ambulatory Visit (INDEPENDENT_AMBULATORY_CARE_PROVIDER_SITE_OTHER): Payer: BLUE CROSS/BLUE SHIELD | Admitting: Internal Medicine

## 2017-05-14 VITALS — BP 106/70 | HR 63 | Ht 72.0 in | Wt 235.0 lb

## 2017-05-14 DIAGNOSIS — Z95810 Presence of automatic (implantable) cardiac defibrillator: Secondary | ICD-10-CM

## 2017-05-14 DIAGNOSIS — I5022 Chronic systolic (congestive) heart failure: Secondary | ICD-10-CM | POA: Diagnosis not present

## 2017-05-14 DIAGNOSIS — I255 Ischemic cardiomyopathy: Secondary | ICD-10-CM | POA: Diagnosis not present

## 2017-05-14 DIAGNOSIS — Z95811 Presence of heart assist device: Secondary | ICD-10-CM

## 2017-05-14 LAB — CUP PACEART INCLINIC DEVICE CHECK
Brady Statistic RV Percent Paced: 0 %
HighPow Impedance: 58 Ohm
Implantable Lead Location: 753860
Implantable Lead Model: 7001
Lead Channel Impedance Value: 425 Ohm
Lead Channel Pacing Threshold Pulse Width: 1 ms
Lead Channel Setting Pacing Pulse Width: 1 ms
Lead Channel Setting Sensing Sensitivity: 0.5 mV
MDC IDC LEAD IMPLANT DT: 20070207
MDC IDC MSMT BATTERY REMAINING LONGEVITY: 67 mo
MDC IDC MSMT LEADCHNL RV PACING THRESHOLD AMPLITUDE: 1.75 V
MDC IDC MSMT LEADCHNL RV SENSING INTR AMPL: 5.4 mV
MDC IDC PG IMPLANT DT: 20141215
MDC IDC PG SERIAL: 7155171
MDC IDC SESS DTM: 20190513160041
MDC IDC SET LEADCHNL RV PACING AMPLITUDE: 4 V

## 2017-05-14 NOTE — Progress Notes (Signed)
Patient Care Team: Kathyrn Lass, MD as PCP - General (Family Medicine) Bensimhon, Shaune Pascal, MD as Consulting Physician (Cardiology)   HPI  [name] is a 62 y.o. male Seen in followup for ischemic heart disease with prior stenting of LAD/RCA and LCx with last Cath 2006. He has a previously implanted ICD for which  he underwent generator replacement 12/14 .  He has known peripheral vascular disease with ABIs in remote past and carotid Dopplers in our office a year ago with 60-79% bilaterally.   He was 11/15  hosp for chf  EF 25 %;Last myoview was in 07/2012 with large, severe, fixed anterior, septal, inferior and apical infarct; no ischemia, EF 26%     He is now status post LVAD insertion.  He has had a fall.  He has had problems with orthostatic hypotension.  He had syncope and was found to be hypoglycemic.   Past Medical History:  Diagnosis Date  . AICD (automatic cardioverter/defibrillator) present   . Arthritis   . Barretts syndrome   . Carotid artery disease ([place])    a. Dopp 09/2011: 03-49% RICA, 17-91% LICA.;  b.  Carotid US (7/14):  Bilateral 60-79% => f/u 6 mos  . Cerebrovascular disease   . Chronic systolic heart failure ([place])   . Coronary artery disease    a. AWMI requiring IABP 2005 s/p Horizon study stent-LAD, staged BMS-Cx, DESx2-RCA. b. Last Last LHC (6/06):  EF 25%, pLAD stent 40-50, after stent 40, dLAD 20, pCFX 20, pOM1 70, pRCA 20, mRCA stents ok.=> med Rx;  c.  Myoview (7/14):  EF 26%, large ant, septal, inf and apical infarct, no ischemia.  Med Rx continued  . Diabetes mellitus   . Dyspnea    with exertion  . Headache   . Heart murmur   . History of kidney stones   . HTN (hypertension)   . Hyperlipidemia    mixed  . ICD (implantable cardiac defibrillator), dual, in situ    St. Jude for severe LVD EF 25% 2/07 explanted 2010. Medtronic Virtuoso II DR Dual-chamber cardiverter-defibrillator  with pocket revision, Dr. Caryl Comes  . Ischemic cardiomyopathy    a.  Echo (09/27/12): EF 20%, diffuse HK with periapical AK, no LV thrombus noted, restrictive physiology, trivial MR, mild LAE, RVSF mildly reduced, PASP 39  . Pleural effusion 01/05/2016  . Presence of permanent cardiac pacemaker   . PVD (peripheral vascular disease) ([facility])    a. ABI 09/2011 - R normal, L moderate - saw Dr. Fletcher Anon - med rx.   Marland Kitchen RIATA ICD Lead--on advisory recall    . Tobacco abuse     Past Surgical History:  Procedure Laterality Date  . ABDOMINAL AORTAGRAM N/A 11/06/2013   Procedure: ABDOMINAL Maxcine Ham;  Surgeon: Angelia Mould, MD;  Location: [facility] CATH LAB;  Service: Cardiovascular;  Laterality: N/A;  . APPENDECTOMY  2000   ruptured  . CARDIAC CATHETERIZATION N/A 01/07/2016   Procedure: Right/Left Heart Cath and Coronary Angiography;  Surgeon: Larey Dresser, MD;  Location: [place] CV LAB;  Service: Cardiovascular;  Laterality: N/A;  . CARDIAC CATHETERIZATION N/A 01/28/2016   Procedure: Coronary Stent Intervention;  Surgeon: Sherren Mocha, MD;  Location: [place] CV LAB;  Service: Cardiovascular;  Laterality: N/A;  . CARDIAC CATHETERIZATION N/A 01/28/2016   Procedure: Right Heart Cath;  Surgeon: Sherren Mocha, MD;  Location: [place] CV LAB;  Service: Cardiovascular;  Laterality: N/A;  . CARDIAC CATHETERIZATION N/A 01/28/2016   Procedure: Intravascular Ultrasound/IVUS;  Surgeon:  Sherren Mocha, MD;  Location: [place] CV LAB;  Service: Cardiovascular;  Laterality: N/A;  . CHEST TUBE INSERTION Right 04/07/2016   Procedure: INSERTION PLEURAL DRAINAGE CATHETER;  Surgeon: Ivin Poot, MD;  Location: [place];  Service: Thoracic;  Laterality: Right;  . ENTEROSCOPY N/A 07/10/2016   Procedure: ENTEROSCOPY;  Surgeon: Laurence Spates, MD;  Location: [facility] ENDOSCOPY;  Service: Endoscopy;  Laterality: N/A;  . FEMORAL-POPLITEAL BYPASS GRAFT Right 11/07/2013   Procedure: Right FEMORAL-POPLITEAL ARTERY Bypass ;  Surgeon: Angelia Mould, MD;  Location: [place];  Service:  Vascular;  Laterality: Right;  . FEMORAL-POPLITEAL BYPASS GRAFT Left 11/17/2014   Procedure: BYPASS GRAFT FEMORAL-POPLITEAL ARTERY USING GORE PROPATEN 6MM X 80CM VASCULAR GRAFT;  Surgeon: Angelia Mould, MD;  Location: [place];  Service: Vascular;  Laterality: Left;  . HOT HEMOSTASIS N/A 07/10/2016   Procedure: HOT HEMOSTASIS (ARGON PLASMA COAGULATION/BICAP);  Surgeon: Laurence Spates, MD;  Location: [facility] ENDOSCOPY;  Service: Endoscopy;  Laterality: N/A;  . IMPLANTABLE CARDIOVERTER DEFIBRILLATOR (ICD) GENERATOR CHANGE N/A 12/16/2012   Procedure: ICD GENERATOR CHANGE;  Surgeon: Deboraha Sprang, MD;  Location: [facility] CATH LAB;  Service: Cardiovascular;  Laterality: N/A;  . INSERTION OF IMPLANTABLE LEFT VENTRICULAR ASSIST DEVICE N/A 05/30/2016   Procedure: INSERTION OF IMPLANTABLE LEFT VENTRICULAR ASSIST DEVICE;  Surgeon: Ivin Poot, MD;  Location: [place];  Service: Open Heart Surgery;  Laterality: N/A;  CIRC ARREST  NITRIC OXIDE  . INTRAOPERATIVE ARTERIOGRAM Right 11/07/2013   Procedure: INTRA OPERATIVE ARTERIOGRAM;  Surgeon: Angelia Mould, MD;  Location: [place];  Service: Vascular;  Laterality: Right;  . LOWER EXTREMITY ANGIOGRAM Bilateral 11/06/2013   Procedure: LOWER EXTREMITY ANGIOGRAM;  Surgeon: Angelia Mould, MD;  Location: [facility] CATH LAB;  Service: Cardiovascular;  Laterality: Bilateral;  . Medtronic Virtuoso II DR dual-chamber cardioverter-defibrillation with pocket revison     Dr. Virl Axe  . MULTIPLE EXTRACTIONS WITH ALVEOLOPLASTY N/A 04/26/2016   Procedure: Extraction of tooth #'s 5-14 and 20-30 with alveoloplasty;  Surgeon: Lenn Cal, DDS;  Location: [place];  Service: Oral Surgery;  Laterality: N/A;  . PERIPHERAL VASCULAR CATHETERIZATION N/A 09/25/2014   Procedure: Abdominal Aortogram;  Surgeon: Elam Dutch, MD;  Location: [place] CV LAB;  Service: Cardiovascular;  Laterality: N/A;  . REMOVAL OF PLEURAL DRAINAGE CATHETER Right 06/13/2016   Procedure: REMOVAL  OF PLEURAL DRAINAGE CATHETER;  Surgeon: Ivin Poot, MD;  Location: [place];  Service: Thoracic;  Laterality: Right;  . RIGHT HEART CATH N/A 05/01/2016   Procedure: Right Heart Cath;  Surgeon: Larey Dresser, MD;  Location: [place] CV LAB;  Service: Cardiovascular;  Laterality: N/A;  . RIGHT HEART CATH N/A 05/25/2016   Procedure: Right Heart Cath;  Surgeon: Larey Dresser, MD;  Location: [place] CV LAB;  Service: Cardiovascular;  Laterality: N/A;  . TEE WITHOUT CARDIOVERSION N/A 05/30/2016   Procedure: TRANSESOPHAGEAL ECHOCARDIOGRAM (TEE);  Surgeon: Prescott Gum, Collier Salina, MD;  Location: [place];  Service: Open Heart Surgery;  Laterality: N/A;  . US GUIDED THORACENTESIS RIGHT ([place] HX) Right 01/05/2016    Current Outpatient Medications  Medication Sig Dispense Refill  . atorvastatin (LIPITOR) 80 MG tablet Take 1 tablet (80 mg total) by mouth daily. 30 tablet 6  . blood glucose meter kit and supplies Ultra Blue Kit Dispense based on patient and insurance preference. Use up to four times daily as directed. (FOR ICD-9 250.00, 250.01). 1 each 0  . danazol (DANOCRINE) 100 MG capsule Take 1 capsule (100  mg total) by mouth 2 (two) times daily. 60 capsule 6  . docusate sodium (COLACE) 100 MG capsule Take 1 capsule (100 mg total) by mouth 2 (two) times daily. 10 capsule 0  . ezetimibe (ZETIA) 10 MG tablet Take 1 tablet (10 mg total) by mouth daily. 90 tablet 3  . Ferrous Sulfate (IRON) 325 (65 Fe) MG TABS Take 1 tablet (325 mg total) by mouth 2 (two) times daily. 60 tablet 6  . insulin NPH-regular Human (NOVOLIN 70/30) (70-30) 100 UNIT/ML injection Inject 12 Units into the skin 3 (three) times daily.     Marland Kitchen losartan (COZAAR) 50 MG tablet Take 1 tablet (50 mg total) by mouth 2 (two) times daily. 60 tablet 6  . metoCLOPramide (REGLAN) 5 MG tablet Take 1 tablet (5 mg total) by mouth 3 (three) times daily before meals. 90 tablet 1  . oxyCODONE-acetaminophen (PERCOCET/ROXICET) 5-325 MG tablet Take 1-2  tablets by mouth every 8 (eight) hours as needed for moderate pain. 30 tablet 0  . pantoprazole (PROTONIX) 40 MG tablet Take 1 tablet (40 mg total) by mouth 2 (two) times daily. 180 tablet 6  . potassium chloride SA (K-DUR,KLOR-CON) 20 MEQ tablet Take 2 tablets (40 mEq total) by mouth daily. 90 tablet 3  . sildenafil (REVATIO) 20 MG tablet Take 1 tablet (20 mg total) by mouth 3 (three) times daily. (Patient taking differently: Take 20 mg by mouth 2 (two) times daily. ) 90 tablet 11  . spironolactone (ALDACTONE) 25 MG tablet Take 0.5 tablets (12.5 mg total) by mouth daily. 30 tablet 6  . sucralfate (CARAFATE) 1 g tablet Take 1 tablet (1 g total) by mouth 4 (four) times daily -  with meals and at bedtime. 120 tablet 6  . torsemide (DEMADEX) 20 MG tablet Take 2 tablets (40 mg total) by mouth 2 (two) times daily. 180 tablet 3  . traMADol (ULTRAM) 50 MG tablet Take 1-2 tablets (50-100 mg total) by mouth every 6 (six) hours as needed. 60 tablet 0  . warfarin (COUMADIN) 5 MG tablet Take 1 tablet (5 mg) daily except 1/2 tablet (2.5 mg) on Monday     No current facility-administered medications for this visit.     Allergies  Allergen Reactions  . Metformin And Related Nausea And Vomiting    *Only the extended release* (does this)  . Niacin And Related Other (See Comments)    REACTION IS SIDE EFFECT "SEVERE" FLUSHING    Review of Systems negative except from HPI and PMH  Physical Exam BP 106/70   Pulse 63   Ht 6' (1.829 m)   Wt 235 lb (106.6 kg)   SpO2 93%   BMI 31.87 kg/m  Well developed and nourished in no acute distress HENT normal Neck supple with JVP-flat Clear LVAD hum Regular rate and rhythm,  Abd-soft with active BS No Clubbing cyanosis edema Skin-warm and dry A & Oriented  Grossly normal sensory and motor function  ECG personally reviewed from 04/14/2017 sinus rhythm at 85 with PVCs Intervals 13/13/42 Nonspecific IVCD    Assessment and  Plan  CHF SYSTOLIC CLASS  2  ischemic cardiomyopathy   implantable defibrillator St. Jude The patient's device was interrogated.  The information was reviewed. No changes were made in the programming.     IVCD  PVCs  Falls  Syncope-hypoglycemia  S/p LVAD   Functionally he is much improved following LVAD.  His edema is resolved.  No interval ventricular arrhythmias.  His falls may be related to  hypotension.  We will ReachOut to Dr. DM as to decreasing his losartan.  Also, the issue of whether his PVCs are contributing his symptoms consider.  His heart rate histogram suggests a rather trivial contribution  We spent more than 50% of our >25 min visit in face to face counseling regarding the above

## 2017-05-14 NOTE — Patient Instructions (Signed)
Medication Instructions: Your physician recommends that you continue on your current medications as directed. Please refer to the Current Medication list given to you today.  Labwork: None Ordered  Procedures/Testing: None Ordered  Follow-Up: Your physician wants you to follow-up in: 1 YEAR with Dr. Logan Bores will receive a reminder letter in the mail two months in advance. If you don't receive a letter, please call our office to schedule the follow-up appointment.  Your physician recommends that you schedule a follow-up appointment in: 3 MONTHS with the Device Clinic .   If you need a refill on your cardiac medications before your next appointment, please call your pharmacy.

## 2017-05-15 ENCOUNTER — Other Ambulatory Visit (HOSPITAL_COMMUNITY): Payer: Self-pay | Admitting: *Deleted

## 2017-05-15 MED ORDER — SILDENAFIL CITRATE 20 MG PO TABS: 20.0000 mg | ORAL_TABLET | Freq: Three times a day (TID) | ORAL | 11 refills | Status: AC

## 2017-05-16 ENCOUNTER — Telehealth (HOSPITAL_COMMUNITY): Payer: Self-pay

## 2017-05-16 NOTE — Telephone Encounter (Signed)
Spoke with [place] gave verbal for refill. Pt aware

## 2017-05-17 ENCOUNTER — Ambulatory Visit (HOSPITAL_COMMUNITY): Payer: Self-pay | Admitting: Pharmacist

## 2017-05-17 ENCOUNTER — Encounter (HOSPITAL_COMMUNITY): Payer: Self-pay

## 2017-05-17 ENCOUNTER — Other Ambulatory Visit (HOSPITAL_COMMUNITY): Payer: Self-pay | Admitting: *Deleted

## 2017-05-17 ENCOUNTER — Ambulatory Visit (HOSPITAL_COMMUNITY)
Admission: RE | Admit: 2017-05-17 | Discharge: 2017-05-17 | Disposition: A | Discharge status: Home / Self Care | Payer: BLUE CROSS/BLUE SHIELD | Source: Ambulatory Visit | Attending: Internal Medicine | Admitting: Internal Medicine

## 2017-05-17 DIAGNOSIS — I252 Old myocardial infarction: Secondary | ICD-10-CM | POA: Diagnosis not present

## 2017-05-17 DIAGNOSIS — I5022 Chronic systolic (congestive) heart failure: Secondary | ICD-10-CM | POA: Diagnosis not present

## 2017-05-17 DIAGNOSIS — Z95811 Presence of heart assist device: Secondary | ICD-10-CM | POA: Diagnosis not present

## 2017-05-17 DIAGNOSIS — I5023 Acute on chronic systolic (congestive) heart failure: Secondary | ICD-10-CM

## 2017-05-17 DIAGNOSIS — Z7901 Long term (current) use of anticoagulants: Secondary | ICD-10-CM | POA: Insufficient documentation

## 2017-05-17 DIAGNOSIS — Z8249 Family history of ischemic heart disease and other diseases of the circulatory system: Secondary | ICD-10-CM | POA: Insufficient documentation

## 2017-05-17 DIAGNOSIS — K31811 Angiodysplasia of stomach and duodenum with bleeding: Secondary | ICD-10-CM

## 2017-05-17 DIAGNOSIS — Z955 Presence of coronary angioplasty implant and graft: Secondary | ICD-10-CM | POA: Insufficient documentation

## 2017-05-17 DIAGNOSIS — I5043 Acute on chronic combined systolic (congestive) and diastolic (congestive) heart failure: Secondary | ICD-10-CM | POA: Diagnosis not present

## 2017-05-17 DIAGNOSIS — I2510 Atherosclerotic heart disease of native coronary artery without angina pectoris: Secondary | ICD-10-CM | POA: Insufficient documentation

## 2017-05-17 DIAGNOSIS — Z79899 Other long term (current) drug therapy: Secondary | ICD-10-CM | POA: Diagnosis not present

## 2017-05-17 DIAGNOSIS — Z794 Long term (current) use of insulin: Secondary | ICD-10-CM | POA: Insufficient documentation

## 2017-05-17 DIAGNOSIS — Z95810 Presence of automatic (implantable) cardiac defibrillator: Secondary | ICD-10-CM | POA: Diagnosis not present

## 2017-05-17 DIAGNOSIS — S32011D Stable burst fracture of first lumbar vertebra, subsequent encounter for fracture with routine healing: Secondary | ICD-10-CM | POA: Diagnosis not present

## 2017-05-17 DIAGNOSIS — I6523 Occlusion and stenosis of bilateral carotid arteries: Secondary | ICD-10-CM | POA: Diagnosis not present

## 2017-05-17 DIAGNOSIS — F17210 Nicotine dependence, cigarettes, uncomplicated: Secondary | ICD-10-CM | POA: Diagnosis not present

## 2017-05-17 DIAGNOSIS — R110 Nausea: Secondary | ICD-10-CM | POA: Insufficient documentation

## 2017-05-17 DIAGNOSIS — I110 Hypertensive heart disease with heart failure: Secondary | ICD-10-CM | POA: Diagnosis not present

## 2017-05-17 DIAGNOSIS — Z833 Family history of diabetes mellitus: Secondary | ICD-10-CM | POA: Diagnosis not present

## 2017-05-17 DIAGNOSIS — E1151 Type 2 diabetes mellitus with diabetic peripheral angiopathy without gangrene: Secondary | ICD-10-CM | POA: Insufficient documentation

## 2017-05-17 DIAGNOSIS — X58XXXD Exposure to other specified factors, subsequent encounter: Secondary | ICD-10-CM | POA: Insufficient documentation

## 2017-05-17 DIAGNOSIS — J449 Chronic obstructive pulmonary disease, unspecified: Secondary | ICD-10-CM | POA: Insufficient documentation

## 2017-05-17 DIAGNOSIS — E785 Hyperlipidemia, unspecified: Secondary | ICD-10-CM | POA: Insufficient documentation

## 2017-05-17 DIAGNOSIS — I255 Ischemic cardiomyopathy: Secondary | ICD-10-CM | POA: Insufficient documentation

## 2017-05-17 DIAGNOSIS — I509 Heart failure, unspecified: Secondary | ICD-10-CM

## 2017-05-17 DIAGNOSIS — I2721 Secondary pulmonary arterial hypertension: Secondary | ICD-10-CM | POA: Insufficient documentation

## 2017-05-17 LAB — PREALBUMIN: Prealbumin: 28.7 mg/dL (ref 18–38)

## 2017-05-17 LAB — COMPREHENSIVE METABOLIC PANEL
ALBUMIN: 3.8 g/dL (ref 3.5–5.0)
ALK PHOS: 99 U/L (ref 38–126)
ALT: 20 U/L (ref 17–63)
AST: 16 U/L (ref 15–41)
Anion gap: 10 (ref 5–15)
BILIRUBIN TOTAL: 0.6 mg/dL (ref 0.3–1.2)
BUN: 12 mg/dL (ref 6–20)
CALCIUM: 9.3 mg/dL (ref 8.9–10.3)
CO2: 30 mmol/L (ref 22–32)
CREATININE: 1.45 mg/dL — AB (ref 0.61–1.24)
Chloride: 95 mmol/L — ABNORMAL LOW (ref 101–111)
GFR calc non Af Amer: 50 mL/min — ABNORMAL LOW (ref >60)
GFR, EST AFRICAN AMERICAN: 58 mL/min — AB (ref >60)
GLUCOSE: 202 mg/dL — AB (ref 65–99)
Potassium: 4.5 mmol/L (ref 3.5–5.1)
SODIUM: 135 mmol/L (ref 135–145)
TOTAL PROTEIN: 7.8 g/dL (ref 6.5–8.1)

## 2017-05-17 LAB — CBC
HEMATOCRIT: 42.6 % (ref 39.0–52.0)
Hemoglobin: 13.5 g/dL (ref 13.0–17.0)
MCH: 27 pg (ref 26.0–34.0)
MCHC: 31.7 g/dL (ref 30.0–36.0)
MCV: 85.2 fL (ref 78.0–100.0)
PLATELETS: 129 10*3/uL — AB (ref 150–400)
RBC: 5 MIL/uL (ref 4.22–5.81)
RDW: 18.6 % — AB (ref 11.5–15.5)
WBC: 7.8 10*3/uL (ref 4.0–10.5)

## 2017-05-17 LAB — LACTATE DEHYDROGENASE: LDH: 176 U/L (ref 98–192)

## 2017-05-17 LAB — PROTIME-INR
INR: 1.94
Prothrombin Time: 22 seconds — ABNORMAL HIGH (ref 11.4–15.2)

## 2017-05-17 MED ORDER — TORSEMIDE 20 MG PO TABS
ORAL_TABLET | ORAL | 6 refills | Status: DC
Start: 2017-05-17 — End: 2017-06-12

## 2017-05-17 MED ORDER — POTASSIUM CHLORIDE CRYS ER 20 MEQ PO TBCR: 60.0000 meq | EXTENDED_RELEASE_TABLET | Freq: Every day | ORAL | 6 refills | Status: DC

## 2017-05-17 NOTE — Patient Instructions (Addendum)
1. Re-start Sildenafil 2. Increase Danazole back to prescribed dose of 100 mg bid. 3. Decrease Torsemide to 40 mg in am and 20 mg in pm.  4. Return to VAD clinic in 2 months. Notify VAD coordinator if Low Flow alarms continue after above med adjustments.

## 2017-05-17 NOTE — Progress Notes (Addendum)
Patient presents for 3 week f/u in VAD clinic. Reports no problems with VAD equipment or concerns with drive line.  Pt does report several "low flow" VAD alarms, after interrogation, found to have since last visit Low Flows as follows:   05/12/17 - 6 Low flow alarms  05/14/17 - 1   "      "       "  05/15/17 - 1   "      "       "  05/16/17 - 1   "      "       "  05/17/17-  1   "      "       "  Pt does admit to accidentally taking double his Spiro dose for @ 5 days (took whole 25 mg pill instead of 1/2 pill) during the time frame above.  He has been drinking < 2 liters/day and has continued Torsemide 40 mg bid.   Pt also reports the following med changes per him:  Novolon Insulin - he is taking 12 unit twice daily instead of tid; reports BSs running between 55 - 140  Danazol - decreased to once a day dosing to "spread out"  Reglan - takes 1 - 2 pills on "most days", some days none. Does report his stomach doesn't "hurt as bad when he                            takes it"  Sildenafil - has "been out" of medication for 2 - 3 days; "waiting on meds to arrived".  Pt wearing back brace @ 7 hrs per day. Is taking Tramadol only at this time for pain management. Has f/u with neurology tomorrow.   Vital Signs:  Doppler Pressure  114 Automatc BP:  122/93 (102) HR:  82 SPO2: 100  Weight:  236.8  lb w/ back brace Last weight: 242.6 lb w/ back brace   VAD Indication: Destination Therapy - current smoker  VAD interrogation & Equipment Management: Speed: 2700 Flow:  4.5 Power:  4.6 w    Peak/trough:  6.0/2.0 HCT: 42 Alarm settings: 2.0/6.5 Suction: On Lavare: On   Exit Site Care: Drive line is being maintained weekly  by [employer]. Unable to assess drive line at visit today; back brace in place. Pt denies any issues with drive line site reported by his father.  Pt denies fever or chills. Pt given 8 weekly dressing kits for home use.     Significant Events on VAD Support:  05/31/16 -  Controller power-up associated with pump start event. Controller change out performed.  06/01/16 - Controller pins lubricated per St Jude reps  06/17/16 - Controller power-up associated with pump start event while changing power source 06/22/16 - Controller pins re-lubricated per [employer] 07/07/17 - Hospitalization for acute blood loss from upper GIB; EGD with 2 AVMs; start Octreotide 12/20/16 - stopped ASA due to continued GI bleed; GI would not consider colonoscopy unless pt was actively bleeding 04/11/17 - Hospitalization for syncope with L1 burst fracture   Device:St Jude single lead- followed at device clinic Therapies: on at 200 bpm Last check: 05/14/17   BP & Labs:  Doppler BP 114 - Doppler is reflecting modified systolic   Hgb - 16.1 no S/S of bleeding. Specifically denies melena/BRBPR or nosebleeds.  LDH 176 established baseline of 190- 250. Denies tea-colored urine. No  power elevations noted on interrogation.   1 year Intermacs follow up completed including:  Quality of Life, KCCQ-12, and Neurocognitive trail making.   Pt unable to complete 6 minute walk due to back fracture/back brace and difficulty walking in same.   Patient Instructions:  1. Re-start Sildenafil 2. Increase Danazole back to prescribed dose of 100 mg bid. 3. Decrease Torsemide to 40 mg in am and 20 mg in pm.  4. Return to VAD clinic in 2 months. Notify VAD coordinator if Low Flow alarms continue after above med adjustments.    Hessie Diener RN, BSN VAD Coordinator   Office: [phone] 24/7 Emergency VAD Pager:  [phone]

## 2017-05-17 NOTE — Progress Notes (Signed)
Advanced Heart Failure Clinic Note   PCP: Dr. Kathyrn Lass Cardiology: Dr. Stanford Breed HF Cardiology: Dr. Delilah Shan is a 62 y.o. male smoker with history of PAD, CAD, and ischemic cardiomyopathy now s/p HVAD placement presents for followup of CHF and LVAD.  Patient was admitted in 1/18 with PNA and acute on chronic systolic CHF.  He was markedly volume overloaded and also had low cardiac output requiring dobutamine use.  He had a right pleural effusion and underwent thoracentesis.  Fluid studies suggested a parapneumonic effusion.  He had cardiac cath, showing 3 vessel disease and markedly elevated filling pressures, right and left.  He was evaluated for CABG but turned down given comorbidities and lack of venous conduits.  We were able to wean him off dobutamine and discharged him with plan for outpatient PCI once he had recovered from the current hospitalization.   Admitted 01/28/16 - 02/03/16 after presenting for elective 2 vessel PCI of the LAD and OM of the circumflex. RHC at same time showed advanced HF with low cardiac index and severely elevated pressures. Diuresed with IV lasix, metolazone, and milrinone, but refused PICC line to follow coox/CVP. Underwent thoracentesis 02/01/16 with 1.8 L out. Overall diuresed 16 lbs. Discharge weight 214 lbs.  He was admitted in 4/18 with low output HF and severe NYHA class IV symptoms, started on milrinone.  Unable to titrate off milrinone and is now on at home.  He had a right chronic transudative effusion, PleurX catheter was placed.    He had HVAD placed 05/30/16.  He was diuresed post-op and speed was increased to 3000.  It was later dropped to 2940.  Pleurx catheter was removed.    He was admitted in 7/18 with upper GI bleed.  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  He had 3 units PRBCs.  ASA was decreased from 325 to 81 daily and warfarin goal was decreased to 2-2.5.  Octreotide was started but he developed profuse diarrhea  that seems to have been related to octreotide.  This was stopped and he was started on danazol.   He has calf claudication on left after walking a moderate distance.  This is chronic and followed by [facility]. His left fem-pop graft has occluded, plan for medical management as symptoms are mild.  No pedal ulcerations.    At last appointment, he reported dark stool and hgb was lower.  He had been having significant epistaxis and INR was > 5.  ASA was stopped.  Since then, he has had no further epistaxis or dark stool.  Hgb has been stable, most recently 10.3.   Admitted 4/10 through 04/16/2017. Admitted for syncope thought to be the result of a vagal episode. Due to cough he had L1 Fracture . Neuro surgery consulted with back brace applied. He will follow up with Dr Arnoldo Morale in 6 weeks. Due to recurrent N/V GI consulted and he was placed on carafate and reglan.   He presents today for regular follow up. Overall feeling well. Has had several low flows over the past week. Realized he was taking spiro 25 instead of half tab. Appetite OK. Back healing. Sees Dr. Arnoldo Morale tomorrow. Uses back brace when ambulating, 7-8 hours daily. He has very mild lightheadedness and nausea when he has low flows. No further falls, no dizziness. Dad performs his dressing changes.    Vital Signs:  Doppler Pressure: 114 Automatc BP: 122/93 (102) HR: 82 SPO2: 100  Weight: 236.8 lb w/ back  brace Last weight: 242.6 lbs  Home weights: 230-235 lbs  VAD Indication: Destination Therapy - current smoker  VAD interrogation & Equipment Management: Speed: 2700 Flow: 4.5 Power: 4.6 w Peak/trough: 6.0/2.0 HCT: 42 Alarm settings: Multiple low flow events. Suction: On Lavare: On   Labs (1/18): K 4.2, creatinine 0.9, hgb 12.6 Labs (2/18): K 4.2, creatinine 1.23, hgb 12.4, BNP 321 Labs (4/18): hgbA1c 8.9, TSH elevated but free T4 normal.  K 3.2, creatinine 0.8 Labs (5/18): digoxin 0.4, K 3.4, creatinine 0.9 Labs (6/18):  creatinine 0.67 => 0.98, LDH 209, hgb 8.9 => 9 Labs (7/18): K 2.9, creatinine 1.02, LDL 189, hgb 9 Labs (8/18): hgb 10 => 10.2, LDH 204, K 3.2, creatinine 0.9 => 1.04, INR 2.45, LDH 204 Labs (9/18): LDH 206, INR 3.26, K 3.5, creatinine 0.91, hgb 11.3 Labs (10/18): LDH 197, hgb 11.4, K 3.1, creatinine 1.12 Labs (11/18): Hgb 10.3, K 4, creatinine 1.0, LDH 196 Labs (12/18): hgb 11 => 7.9 => 8.6, INR 5 Labs (1/19): hgb 10.3, ferritin 62 Labs 04/23/2017: hgb 13.4 WBC 7.2  K 3.3   PMH: 1. PAD: Bilateral fem-pop bypasses - Arterial dopplers (9/18) with occluded left fem-pop, right fem-pop patent.  2. CAD: Anterior MI 2005 => PCI to LAD followed by staged PCI to LCx and RCA.   - LHC (1/18): Diffuse up to 80% calcified stenosis throughout the proximal LAD.  Diffuse disease in distal LAD up to 50%.  Large OM1 totally occluded at the ostium but reconstituted via collaterals.  80% distal RCA stenosis involving the ostia of the PLV and PDA with about 70% stenosis. Patient had PCI with DES to proximal LAD and PTCA OM1 (unable to place stent).  3. Chronic systolic CHF: Ischemic cardiomyopathy.  St Jude ICD.  - Echo (1/18) with EF 20%, mildly decreased RV systolic function.  - RHC (1/18) with mean RA 21, PA 69/31 mean 45, mean PCWP 27, CI 2.37, PVR 3.3 WU.  - Repeat RHC (1/18) with mean RA 16, mean PCWP 21, CI 1.92 - Echo (3/18) with EF 20-25%, mild MR, mild RV dilation with normal systolic function, PASP 47 mmHg.  - 4/18 admission with low output HF and milrinone gtt started (co-ox as low as 42%).  - RHC on milrinone (4/18): mean RA 5, PA 61/24 mean 40, mean PCWP 17, CI 2.1 Fick with PVR 5.1 WU, CI 1.84 thermo with PVR 5.9 WU.  - HVAD placement 05/30/16.  4. Carotid stenosis: Carotid dopplers (8/16) with 40-97% RICA, 35-32% LICA. - Carotid dopplers (2/18) with 40-59% RICA stenosis.  5. Type II diabetes. 6. Hyperlipidemia. 7. HTN 8. COPD:  PFTs (5/18) with moderate to severe obstructive disease.  Active  smoker. 9. Chronic right pleural effusion (transudative) with PleurX catheter.  10. Upper GI bleeding: 7/18, enteroscopy with 2 small bowel AVMs (treated).   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  Smokes < 5 cigs/day.  Occasional ETOH.   Review of systems complete and found to be negative unless listed in HPI.    Family History  Problem Relation Age of Onset  . Diabetes Mother   . Coronary artery disease Father   . Heart disease Father        before age 41   Current Outpatient Medications  Medication Sig Dispense Refill  . atorvastatin (LIPITOR) 80 MG tablet Take 1 tablet (80 mg total) by mouth daily. 30 tablet 6  . blood glucose meter kit and supplies Ultra Blue Kit Dispense based  on patient and insurance preference. Use up to four times daily as directed. (FOR ICD-9 250.00, 250.01). 1 each 0  . danazol (DANOCRINE) 100 MG capsule Take 1 capsule (100 mg total) by mouth 2 (two) times daily. 60 capsule 6  . docusate sodium (COLACE) 100 MG capsule Take 1 capsule (100 mg total) by mouth 2 (two) times daily. 10 capsule 0  . ezetimibe (ZETIA) 10 MG tablet Take 1 tablet (10 mg total) by mouth daily. 90 tablet 3  . Ferrous Sulfate (IRON) 325 (65 Fe) MG TABS Take 1 tablet (325 mg total) by mouth 2 (two) times daily. 60 tablet 6  . insulin NPH-regular Human (NOVOLIN 70/30) (70-30) 100 UNIT/ML injection Inject 12 Units into the skin 3 (three) times daily.     Marland Kitchen losartan (COZAAR) 50 MG tablet Take 1 tablet (50 mg total) by mouth 2 (two) times daily. 60 tablet 6  . metoCLOPramide (REGLAN) 5 MG tablet Take 1 tablet (5 mg total) by mouth 3 (three) times daily before meals. 90 tablet 1  . oxyCODONE-acetaminophen (PERCOCET/ROXICET) 5-325 MG tablet Take 1-2 tablets by mouth every 8 (eight) hours as needed for moderate pain. 30 tablet 0  . pantoprazole (PROTONIX) 40 MG tablet Take 1 tablet (40 mg total) by mouth 2 (two) times daily. 180 tablet 6  . potassium chloride SA (K-DUR,KLOR-CON)  20 MEQ tablet Take 2 tablets (40 mEq total) by mouth daily. 90 tablet 3  . sildenafil (REVATIO) 20 MG tablet Take 1 tablet (20 mg total) by mouth 3 (three) times daily. 90 tablet 11  . spironolactone (ALDACTONE) 25 MG tablet Take 0.5 tablets (12.5 mg total) by mouth daily. 30 tablet 6  . sucralfate (CARAFATE) 1 g tablet Take 1 tablet (1 g total) by mouth 4 (four) times daily -  with meals and at bedtime. 120 tablet 6  . torsemide (DEMADEX) 20 MG tablet Take 2 tablets (40 mg total) by mouth 2 (two) times daily. 180 tablet 3  . traMADol (ULTRAM) 50 MG tablet Take 1-2 tablets (50-100 mg total) by mouth every 6 (six) hours as needed. 60 tablet 0  . warfarin (COUMADIN) 5 MG tablet Take 1 tablet (5 mg) daily except 1/2 tablet (2.5 mg) on Monday     No current facility-administered medications for this encounter.    Vitals:   05/17/17 1026 05/17/17 1027  BP: (!) 122/93 (!) 114/0  Pulse: 82   SpO2: 100%   Weight: 236 lb 12.8 oz (107.4 kg)   Height: 6' (1.829 m)    Wt Readings from Last 3 Encounters:  05/17/17 236 lb 12.8 oz (107.4 kg)  05/14/17 235 lb (106.6 kg)  04/23/17 242 lb 9.6 oz (110 kg)    Physical Exam: GENERAL: Well appearing this am. NAD.  Wearing Back brace.  HEENT: Normal. NECK: Supple, JVP 7-8 cm. Carotids OK.  CARDIAC:  Mechanical heart sounds with LVAD hum present.  LUNGS:  CTAB, normal effort.  ABDOMEN:  NT, ND, no HSM. No bruits or masses. +BS  LVAD exit site: Well-healed and incorporated. Dressing dry and intact. No erythema or drainage. Stabilization device present and accurately applied. Driveline dressing changed daily per sterile technique. EXTREMITIES:  Warm and dry. No cyanosis, clubbing, rash, or edema.  NEUROLOGIC:  Alert & oriented x 3. Cranial nerves grossly intact. Moves all 4 extremities w/o difficulty. Affect pleasant        Wt Readings from Last 3 Encounters:  05/17/17 236 lb 12.8 oz (107.4 kg)  05/14/17 235 lb (  106.6 kg)  04/23/17 242 lb 9.6 oz (110 kg)    Assessment/Plan: 1. Chronic systolic CHF: Ischemic cardiomyopathy.  Echo in 3/18 with EF 20-25%.  He did not have much effect from 12/17 PCI in terms of symptoms or EF.  St Jude ICD.  He is now s/p HVAD placement.  Lavare cycle is on, parameters stable on device with no alarms.   - NYHA II-III, confounded by his back pain s/p fracture.  - Volume status stable to dry - Decrease torsemide to 40 mg this am and 20 mg pm. Creatinine mildly elevated.  - Change spiro to 12.5 mg daily as previously directed.  - VAD interrogated personally. Parameters stable.  - Continue losartan 50 mg twice a day.  - LDH 176 (improved from 230 3 weeks ago). INR 1.94. 2. CAD: 3 vessel CAD, s/p stent to proximal LAD and PTCA to subtotally occluded large OM1 in 1/18.  - No s/s of ischemia.    - He was on Plavix for > 3 months post-intervention, now on warfarin.  ASA stopped with bleeding.  - Continue atorvastatin 80 mg daily.   3. PAD: Stable claudication, notes with moderate walking on left.  Left fem-pop bypass occluded.  No pedal ulcerations. - Medical management per Dr. Scot Dock.  4. Carotid stenosis:  - Followed by [facility].  5. R Pleural effusion:  - Previously had PleurX catheter. Now resolved.   6. Pulmonary arterial HTN: Continue Revatio 20 mg tid. Reinfoeced taking this 3 times a day.  7. Smoking: Discussed smoking cessation.    8. Upper GI bleeding: 7/18, small bowel AVMs.  ASA was decreased to 81 daily.  Warfarin INR goal is now 2-2.5.  He started monthly octreotide injections but had profuse diarrhea with octreotide after 1st injection and after 2nd injection with decreased dose.  He is tolerating danazol without problems. 12/18 had more melena in setting of high INR => due to vigorous epistaxis versus AVM bleeding. ASA was stopped.    - He stay off ASA 81 and will continue INR goal 2-2.5 for now.  INR 1.94 today.  - Continue danazol.  9. H/O Epistaxis:  - No further issues with use of regular saline nasal  spray.  10. L1 Burst Fracture - Wearing back brace 7-8 hours daily.  - Follows up with Dr. Arnoldo Morale tomorrow.  11. Nausea/Dry heaves - Has GI follow up. Continue protonix twice a day. I have asked him to start carafate again as ordered on d/c.  Continue reglan.   Shirley Friar, PA-C  05/17/2017   Patient seen with PA, agree with the above note.  LVAD interrogation shows several low flow alarms.  He has been taking spironolactone 25 mg daily instead of 12.5 mg daily, and I suspect he is a bit dry on torsemide 40 mg bid.  Hemoglobin stable at 13.5.  Still having significant back pain but able to walk (wearing brace).  Creatinine stable at 1.45.   Today, we will decrease spironolactone back to 12.5 mg daily and reduce torsemide to 40 qam/20 qpm.  He needs to get a refill of sildenafil.    Continue warfarin with INR 2-2.5, no overt bleeding and Hgb stable.  Continue on danazol for AVM bleeding prophylaxis, unable to tolerate octreotide.  Loralie Champagne 05/20/2017

## 2017-05-18 ENCOUNTER — Other Ambulatory Visit (HOSPITAL_COMMUNITY): Payer: Self-pay

## 2017-05-18 DIAGNOSIS — I509 Heart failure, unspecified: Secondary | ICD-10-CM

## 2017-05-18 DIAGNOSIS — Z95811 Presence of heart assist device: Secondary | ICD-10-CM

## 2017-05-18 MED ORDER — DANAZOL 100 MG PO CAPS: 100.0000 mg | ORAL_CAPSULE | Freq: Two times a day (BID) | ORAL | 3 refills | Status: AC

## 2017-05-25 ENCOUNTER — Other Ambulatory Visit (HOSPITAL_COMMUNITY): Payer: Self-pay | Admitting: Unknown Physician Specialty

## 2017-05-25 DIAGNOSIS — Z95811 Presence of heart assist device: Secondary | ICD-10-CM

## 2017-05-25 DIAGNOSIS — Z7901 Long term (current) use of anticoagulants: Secondary | ICD-10-CM

## 2017-05-31 ENCOUNTER — Ambulatory Visit (HOSPITAL_COMMUNITY)
Admission: RE | Admit: 2017-05-31 | Discharge: 2017-05-31 | Disposition: A | Discharge status: Home / Self Care | Payer: BLUE CROSS/BLUE SHIELD | Source: Ambulatory Visit | Attending: Cardiology | Admitting: Cardiology

## 2017-05-31 ENCOUNTER — Encounter (HOSPITAL_COMMUNITY): Payer: Self-pay

## 2017-05-31 ENCOUNTER — Ambulatory Visit (HOSPITAL_COMMUNITY): Payer: Self-pay | Admitting: Pharmacist

## 2017-05-31 DIAGNOSIS — Z95811 Presence of heart assist device: Secondary | ICD-10-CM

## 2017-05-31 DIAGNOSIS — Z7901 Long term (current) use of anticoagulants: Secondary | ICD-10-CM | POA: Insufficient documentation

## 2017-05-31 LAB — PROTIME-INR
INR: 1.97
Prothrombin Time: 22.2 seconds — ABNORMAL HIGH (ref 11.4–15.2)

## 2017-05-31 NOTE — Addendum Note (Signed)
Encounter addended by: Levonne Spiller, RN on: 05/31/2017 3:30 PM  Actions taken: Vitals modified, Sign clinical note

## 2017-05-31 NOTE — Progress Notes (Signed)
Patient presents for labs today with nurse visit for BP and VAD parameter check.   Last clinic visit, pt admitted to accidentally taking double his Cleda Daub dose for @ 5 days (took whole 25 mg pill instead of 1/2 pill), drinking < 2 liters/day, and has continued Torsemide 40 mg bid. He had multiple Low Flow alarms and some episodes of dizziness.   Pt was instructed to decrease Torsemide to 40 mg in am and 20 mg in pm which patient says he did. Denies any further VAD alarms.   Pt c/o "ulcer" on right second toe. His father is placing band aid with gauze dressing over site and changing as needed to keep clean and dry. 0.5 cm open ulcer with serous drainage noted; cleaned and re-dressed with band aid, gauze, and soft sock. Pt reports he had a similar problem on foot before and had to see "foot doctor" for same. Pt has diabetes and PVD, stressed importance of f/u with podiatrist, pt will stop by his office when he leaves here today and make appointment.    Vital Signs:  Doppler Pressure: 84 Automatc BP:  110/85 (92) HR:  85 SPO2: 98% RA  VAD Indication: Destination Therapy - current smoker  VAD interrogation & Equipment Management: Speed: 2700 Flow:  4.0 Power:  4.5w    Peak/trough:  6.0/2.0 HCT: 42 Alarm settings: 2.0/6.5 Suction: On Lavare: On Alarms: None    Significant Events on VAD Support:  05/31/16 - Controller power-up associated with pump start event. Controller change out performed.  06/01/16 - Controller pins lubricated per St Jude reps  06/17/16 - Controller power-up associated with pump start event while changing power source 06/22/16 - Controller pins re-lubricated per [employer] 07/07/17 - Hospitalization for acute blood loss from upper GIB; EGD with 2 AVMs; start Octreotide 12/20/16 - stopped ASA due to continued GI bleed; GI would not consider colonoscopy unless pt was actively bleeding 04/11/17 - Hospitalization for syncope with L1 burst fracture   Device:St Jude  single lead- followed at device clinic Therapies: on at 200 bpm Last check: 05/14/17   Patient Instructions:  1. F/u with podiatrist for toe ulcer. 2. Call VAD pager if return of any VAD alarms.    Hessie Diener RN, BSN VAD Coordinator   Office: [phone] 24/7 Emergency VAD Pager: [phone]

## 2017-06-01 ENCOUNTER — Encounter (HOSPITAL_COMMUNITY): Payer: Self-pay | Admitting: Unknown Physician Specialty

## 2017-06-01 DIAGNOSIS — I739 Peripheral vascular disease, unspecified: Secondary | ICD-10-CM

## 2017-06-01 DIAGNOSIS — Z95811 Presence of heart assist device: Secondary | ICD-10-CM

## 2017-06-05 ENCOUNTER — Other Ambulatory Visit (HOSPITAL_COMMUNITY): Payer: Self-pay | Admitting: Unknown Physician Specialty

## 2017-06-05 DIAGNOSIS — Z7901 Long term (current) use of anticoagulants: Secondary | ICD-10-CM

## 2017-06-05 DIAGNOSIS — Z95811 Presence of heart assist device: Secondary | ICD-10-CM

## 2017-06-06 ENCOUNTER — Other Ambulatory Visit: Payer: Self-pay | Admitting: Podiatry

## 2017-06-06 ENCOUNTER — Other Ambulatory Visit: Payer: Self-pay

## 2017-06-06 ENCOUNTER — Encounter (HOSPITAL_COMMUNITY): Payer: Self-pay

## 2017-06-06 ENCOUNTER — Inpatient Hospital Stay (HOSPITAL_COMMUNITY)
Admission: EM | Admit: 2017-06-06 | Discharge: 2017-06-12 | DRG: 853 | Disposition: A | Discharge status: Home Health | Payer: BLUE CROSS/BLUE SHIELD | Attending: Cardiology | Admitting: Cardiology

## 2017-06-06 ENCOUNTER — Ambulatory Visit (INDEPENDENT_AMBULATORY_CARE_PROVIDER_SITE_OTHER): Payer: BLUE CROSS/BLUE SHIELD | Admitting: Podiatry

## 2017-06-06 ENCOUNTER — Emergency Department (HOSPITAL_COMMUNITY): Payer: BLUE CROSS/BLUE SHIELD

## 2017-06-06 ENCOUNTER — Ambulatory Visit (INDEPENDENT_AMBULATORY_CARE_PROVIDER_SITE_OTHER): Payer: BLUE CROSS/BLUE SHIELD

## 2017-06-06 ENCOUNTER — Encounter: Payer: Self-pay | Admitting: Podiatry

## 2017-06-06 DIAGNOSIS — W19XXXD Unspecified fall, subsequent encounter: Secondary | ICD-10-CM

## 2017-06-06 DIAGNOSIS — E1151 Type 2 diabetes mellitus with diabetic peripheral angiopathy without gangrene: Secondary | ICD-10-CM | POA: Diagnosis present

## 2017-06-06 DIAGNOSIS — I5023 Acute on chronic systolic (congestive) heart failure: Secondary | ICD-10-CM

## 2017-06-06 DIAGNOSIS — L97512 Non-pressure chronic ulcer of other part of right foot with fat layer exposed: Secondary | ICD-10-CM | POA: Diagnosis not present

## 2017-06-06 DIAGNOSIS — Z5181 Encounter for therapeutic drug level monitoring: Secondary | ICD-10-CM | POA: Diagnosis not present

## 2017-06-06 DIAGNOSIS — D696 Thrombocytopenia, unspecified: Secondary | ICD-10-CM | POA: Diagnosis present

## 2017-06-06 DIAGNOSIS — I255 Ischemic cardiomyopathy: Secondary | ICD-10-CM | POA: Diagnosis present

## 2017-06-06 DIAGNOSIS — E11621 Type 2 diabetes mellitus with foot ulcer: Secondary | ICD-10-CM

## 2017-06-06 DIAGNOSIS — R6521 Severe sepsis with septic shock: Secondary | ICD-10-CM | POA: Diagnosis present

## 2017-06-06 DIAGNOSIS — Z89421 Acquired absence of other right toe(s): Secondary | ICD-10-CM | POA: Diagnosis not present

## 2017-06-06 DIAGNOSIS — A419 Sepsis, unspecified organism: Secondary | ICD-10-CM | POA: Diagnosis present

## 2017-06-06 DIAGNOSIS — M868X7 Other osteomyelitis, ankle and foot: Secondary | ICD-10-CM | POA: Diagnosis not present

## 2017-06-06 DIAGNOSIS — E871 Hypo-osmolality and hyponatremia: Secondary | ICD-10-CM | POA: Diagnosis present

## 2017-06-06 DIAGNOSIS — E0843 Diabetes mellitus due to underlying condition with diabetic autonomic (poly)neuropathy: Secondary | ICD-10-CM | POA: Diagnosis not present

## 2017-06-06 DIAGNOSIS — Z833 Family history of diabetes mellitus: Secondary | ICD-10-CM

## 2017-06-06 DIAGNOSIS — D649 Anemia, unspecified: Secondary | ICD-10-CM

## 2017-06-06 DIAGNOSIS — L97516 Non-pressure chronic ulcer of other part of right foot with bone involvement without evidence of necrosis: Secondary | ICD-10-CM | POA: Diagnosis present

## 2017-06-06 DIAGNOSIS — F17210 Nicotine dependence, cigarettes, uncomplicated: Secondary | ICD-10-CM | POA: Diagnosis present

## 2017-06-06 DIAGNOSIS — I2721 Secondary pulmonary arterial hypertension: Secondary | ICD-10-CM | POA: Diagnosis present

## 2017-06-06 DIAGNOSIS — A4101 Sepsis due to Methicillin susceptible Staphylococcus aureus: Secondary | ICD-10-CM | POA: Diagnosis present

## 2017-06-06 DIAGNOSIS — Z95811 Presence of heart assist device: Secondary | ICD-10-CM | POA: Diagnosis not present

## 2017-06-06 DIAGNOSIS — R079 Chest pain, unspecified: Secondary | ICD-10-CM

## 2017-06-06 DIAGNOSIS — R7881 Bacteremia: Secondary | ICD-10-CM

## 2017-06-06 DIAGNOSIS — B9561 Methicillin susceptible Staphylococcus aureus infection as the cause of diseases classified elsewhere: Secondary | ICD-10-CM

## 2017-06-06 DIAGNOSIS — E782 Mixed hyperlipidemia: Secondary | ICD-10-CM | POA: Diagnosis present

## 2017-06-06 DIAGNOSIS — R509 Fever, unspecified: Secondary | ICD-10-CM | POA: Diagnosis not present

## 2017-06-06 DIAGNOSIS — E1165 Type 2 diabetes mellitus with hyperglycemia: Secondary | ICD-10-CM | POA: Diagnosis present

## 2017-06-06 DIAGNOSIS — E1169 Type 2 diabetes mellitus with other specified complication: Secondary | ICD-10-CM | POA: Diagnosis present

## 2017-06-06 DIAGNOSIS — S32011D Stable burst fracture of first lumbar vertebra, subsequent encounter for fracture with routine healing: Secondary | ICD-10-CM

## 2017-06-06 DIAGNOSIS — M869 Osteomyelitis, unspecified: Secondary | ICD-10-CM | POA: Diagnosis present

## 2017-06-06 DIAGNOSIS — N179 Acute kidney failure, unspecified: Secondary | ICD-10-CM | POA: Diagnosis present

## 2017-06-06 DIAGNOSIS — L03115 Cellulitis of right lower limb: Secondary | ICD-10-CM | POA: Diagnosis present

## 2017-06-06 DIAGNOSIS — L97519 Non-pressure chronic ulcer of other part of right foot with unspecified severity: Secondary | ICD-10-CM | POA: Diagnosis not present

## 2017-06-06 DIAGNOSIS — Z79899 Other long term (current) drug therapy: Secondary | ICD-10-CM | POA: Diagnosis not present

## 2017-06-06 DIAGNOSIS — I5022 Chronic systolic (congestive) heart failure: Secondary | ICD-10-CM | POA: Diagnosis present

## 2017-06-06 DIAGNOSIS — Z7901 Long term (current) use of anticoagulants: Secondary | ICD-10-CM

## 2017-06-06 DIAGNOSIS — I878 Other specified disorders of veins: Secondary | ICD-10-CM | POA: Diagnosis present

## 2017-06-06 DIAGNOSIS — I96 Gangrene, not elsewhere classified: Secondary | ICD-10-CM | POA: Diagnosis not present

## 2017-06-06 DIAGNOSIS — B9562 Methicillin resistant Staphylococcus aureus infection as the cause of diseases classified elsewhere: Secondary | ICD-10-CM | POA: Diagnosis not present

## 2017-06-06 DIAGNOSIS — M86171 Other acute osteomyelitis, right ankle and foot: Secondary | ICD-10-CM | POA: Diagnosis not present

## 2017-06-06 DIAGNOSIS — I110 Hypertensive heart disease with heart failure: Secondary | ICD-10-CM | POA: Diagnosis present

## 2017-06-06 DIAGNOSIS — L0390 Cellulitis, unspecified: Secondary | ICD-10-CM | POA: Diagnosis not present

## 2017-06-06 DIAGNOSIS — Z9889 Other specified postprocedural states: Secondary | ICD-10-CM

## 2017-06-06 DIAGNOSIS — Z8249 Family history of ischemic heart disease and other diseases of the circulatory system: Secondary | ICD-10-CM

## 2017-06-06 DIAGNOSIS — Z95810 Presence of automatic (implantable) cardiac defibrillator: Secondary | ICD-10-CM | POA: Diagnosis not present

## 2017-06-06 DIAGNOSIS — Z794 Long term (current) use of insulin: Secondary | ICD-10-CM

## 2017-06-06 DIAGNOSIS — R05 Cough: Secondary | ICD-10-CM | POA: Diagnosis not present

## 2017-06-06 DIAGNOSIS — I2510 Atherosclerotic heart disease of native coronary artery without angina pectoris: Secondary | ICD-10-CM | POA: Diagnosis present

## 2017-06-06 DIAGNOSIS — S32001D Stable burst fracture of unspecified lumbar vertebra, subsequent encounter for fracture with routine healing: Secondary | ICD-10-CM

## 2017-06-06 DIAGNOSIS — Z955 Presence of coronary angioplasty implant and graft: Secondary | ICD-10-CM | POA: Diagnosis not present

## 2017-06-06 DIAGNOSIS — I340 Nonrheumatic mitral (valve) insufficiency: Secondary | ICD-10-CM | POA: Diagnosis not present

## 2017-06-06 DIAGNOSIS — K31811 Angiodysplasia of stomach and duodenum with bleeding: Secondary | ICD-10-CM

## 2017-06-06 DIAGNOSIS — I509 Heart failure, unspecified: Secondary | ICD-10-CM

## 2017-06-06 LAB — COMPREHENSIVE METABOLIC PANEL
ALBUMIN: 2.9 g/dL — AB (ref 3.5–5.0)
ALT: 25 U/L (ref 17–63)
AST: 32 U/L (ref 15–41)
Alkaline Phosphatase: 61 U/L (ref 38–126)
Anion gap: 12 (ref 5–15)
BUN: 15 mg/dL (ref 6–20)
CHLORIDE: 95 mmol/L — AB (ref 101–111)
CO2: 22 mmol/L (ref 22–32)
CREATININE: 1.82 mg/dL — AB (ref 0.61–1.24)
Calcium: 8.3 mg/dL — ABNORMAL LOW (ref 8.9–10.3)
GFR calc Af Amer: 44 mL/min — ABNORMAL LOW (ref >60)
GFR, EST NON AFRICAN AMERICAN: 38 mL/min — AB (ref >60)
Glucose, Bld: 202 mg/dL — ABNORMAL HIGH (ref 65–99)
Potassium: 4.2 mmol/L (ref 3.5–5.1)
SODIUM: 129 mmol/L — AB (ref 135–145)
Total Bilirubin: 1.3 mg/dL — ABNORMAL HIGH (ref 0.3–1.2)
Total Protein: 7.3 g/dL (ref 6.5–8.1)

## 2017-06-06 LAB — I-STAT CG4 LACTIC ACID, ED: Lactic Acid, Venous: 1.59 mmol/L (ref 0.5–1.9)

## 2017-06-06 LAB — CBC WITH DIFFERENTIAL/PLATELET
BASOS PCT: 0 %
Basophils Absolute: 0 10*3/uL (ref 0.0–0.1)
EOS PCT: 0 %
Eosinophils Absolute: 0 10*3/uL (ref 0.0–0.7)
HCT: 29.6 % — ABNORMAL LOW (ref 39.0–52.0)
Hemoglobin: 9.6 g/dL — ABNORMAL LOW (ref 13.0–17.0)
LYMPHS ABS: 0.2 10*3/uL — AB (ref 0.7–4.0)
Lymphocytes Relative: 2 %
MCH: 27.5 pg (ref 26.0–34.0)
MCHC: 32.4 g/dL (ref 30.0–36.0)
MCV: 84.8 fL (ref 78.0–100.0)
Monocytes Absolute: 0.7 10*3/uL (ref 0.1–1.0)
Monocytes Relative: 6 %
NEUTROS ABS: 10.6 10*3/uL — AB (ref 1.7–7.7)
Neutrophils Relative %: 92 %
Platelets: 139 10*3/uL — ABNORMAL LOW (ref 150–400)
RBC: 3.49 MIL/uL — ABNORMAL LOW (ref 4.22–5.81)
RDW: 17.6 % — AB (ref 11.5–15.5)
WBC: 11.5 10*3/uL — ABNORMAL HIGH (ref 4.0–10.5)

## 2017-06-06 LAB — PROTIME-INR
INR: 2.36
Prothrombin Time: 25.6 seconds — ABNORMAL HIGH (ref 11.4–15.2)

## 2017-06-06 LAB — LACTATE DEHYDROGENASE: LDH: 201 U/L — ABNORMAL HIGH (ref 98–192)

## 2017-06-06 MED ORDER — LIDOCAINE HCL 1 % IJ SOLN: 5.0000 mL | Freq: Once | INTRAMUSCULAR | Status: DC

## 2017-06-06 MED ORDER — ACETAMINOPHEN 325 MG PO TABS
650.0000 mg | ORAL_TABLET | ORAL | Status: DC | PRN
  Administered 2017-06-07 – 2017-06-08 (×2): 650 mg via ORAL
  Filled 2017-06-06: qty 2

## 2017-06-06 MED ORDER — SODIUM CHLORIDE 0.9 % IV SOLN: 250.0000 mL | INTRAVENOUS | Status: DC | PRN

## 2017-06-06 MED ORDER — INSULIN ASPART PROT & ASPART (70-30 MIX) 100 UNIT/ML ~~LOC~~ SUSP
12.0000 [IU] | Freq: Two times a day (BID) | SUBCUTANEOUS | Status: DC
  Administered 2017-06-07 – 2017-06-12 (×9): 12 [IU] via SUBCUTANEOUS
  Filled 2017-06-06 (×3): qty 10

## 2017-06-06 MED ORDER — SODIUM CHLORIDE 0.9% FLUSH
3.0000 mL | Freq: Two times a day (BID) | INTRAVENOUS | Status: DC
  Administered 2017-06-07 – 2017-06-08 (×2): 3 mL via INTRAVENOUS
  Administered 2017-06-08: 22:00:00 via INTRAVENOUS
  Administered 2017-06-09 – 2017-06-11 (×5): 3 mL via INTRAVENOUS

## 2017-06-06 MED ORDER — ACETAMINOPHEN 650 MG RE SUPP: 650.0000 mg | RECTAL | Status: DC | PRN

## 2017-06-06 MED ORDER — SODIUM CHLORIDE 0.9% FLUSH: 3.0000 mL | INTRAVENOUS | Status: DC | PRN

## 2017-06-06 MED ORDER — PANTOPRAZOLE SODIUM 40 MG PO TBEC
40.0000 mg | DELAYED_RELEASE_TABLET | Freq: Two times a day (BID) | ORAL | Status: DC
  Administered 2017-06-07 – 2017-06-12 (×10): 40 mg via ORAL
  Filled 2017-06-06 (×11): qty 1

## 2017-06-06 MED ORDER — METOCLOPRAMIDE HCL 10 MG PO TABS
5.0000 mg | ORAL_TABLET | Freq: Three times a day (TID) | ORAL | Status: DC
  Administered 2017-06-07 – 2017-06-12 (×13): 5 mg via ORAL
  Filled 2017-06-06 (×14): qty 1

## 2017-06-06 MED ORDER — AMOXICILLIN-POT CLAVULANATE 875-125 MG PO TABS: 1.0000 | ORAL_TABLET | Freq: Two times a day (BID) | ORAL | 0 refills | Status: DC

## 2017-06-06 MED ORDER — PIPERACILLIN-TAZOBACTAM 3.375 G IVPB 30 MIN
3.3750 g | Freq: Once | INTRAVENOUS | Status: AC
  Administered 2017-06-06: 3.375 g via INTRAVENOUS
  Filled 2017-06-06: qty 50

## 2017-06-06 MED ORDER — DANAZOL 100 MG PO CAPS
100.0000 mg | ORAL_CAPSULE | Freq: Two times a day (BID) | ORAL | Status: DC
  Administered 2017-06-07 – 2017-06-12 (×10): 100 mg via ORAL
  Filled 2017-06-06 (×13): qty 1

## 2017-06-06 MED ORDER — SILDENAFIL CITRATE 20 MG PO TABS
20.0000 mg | ORAL_TABLET | Freq: Three times a day (TID) | ORAL | Status: DC
  Administered 2017-06-07 – 2017-06-12 (×14): 20 mg via ORAL
  Filled 2017-06-06 (×17): qty 1

## 2017-06-06 MED ORDER — VANCOMYCIN HCL 10 G IV SOLR
2000.0000 mg | Freq: Once | INTRAVENOUS | Status: AC
  Administered 2017-06-06: 2000 mg via INTRAVENOUS
  Filled 2017-06-06: qty 2000

## 2017-06-06 MED ORDER — LIDOCAINE HCL (PF) 1 % IJ SOLN
5.0000 mL | Freq: Once | INTRAMUSCULAR | Status: AC
  Administered 2017-06-07: 5 mL via INTRADERMAL
  Filled 2017-06-06: qty 5

## 2017-06-06 MED ORDER — FERROUS SULFATE 325 (65 FE) MG PO TABS
325.0000 mg | ORAL_TABLET | Freq: Two times a day (BID) | ORAL | Status: DC
  Administered 2017-06-07 – 2017-06-12 (×7): 325 mg via ORAL
  Filled 2017-06-06 (×8): qty 1

## 2017-06-06 MED ORDER — SODIUM CHLORIDE 0.9 % IV BOLUS
1000.0000 mL | Freq: Once | INTRAVENOUS | Status: AC
  Administered 2017-06-06: 1000 mL via INTRAVENOUS

## 2017-06-06 MED ORDER — EZETIMIBE 10 MG PO TABS
10.0000 mg | ORAL_TABLET | Freq: Every day | ORAL | Status: DC
  Administered 2017-06-07 – 2017-06-12 (×5): 10 mg via ORAL
  Filled 2017-06-06 (×5): qty 1

## 2017-06-06 MED ORDER — ATORVASTATIN CALCIUM 80 MG PO TABS
80.0000 mg | ORAL_TABLET | Freq: Every day | ORAL | Status: DC
  Administered 2017-06-07 – 2017-06-11 (×4): 80 mg via ORAL
  Filled 2017-06-06 (×4): qty 1

## 2017-06-06 MED ORDER — ACETAMINOPHEN 325 MG PO TABS
650.0000 mg | ORAL_TABLET | ORAL | Status: DC | PRN
  Filled 2017-06-06: qty 2

## 2017-06-06 MED ORDER — ONDANSETRON HCL 4 MG/2ML IJ SOLN: 4.0000 mg | Freq: Four times a day (QID) | INTRAMUSCULAR | Status: DC | PRN

## 2017-06-06 NOTE — ED Provider Notes (Signed)
[facility] EMERGENCY DEPARTMENT Provider Note   CSN: [id number] Arrival date & time: 06/06/17  2207     History   Chief Complaint Chief Complaint  Patient presents with  . Weakness  . LVAD    HPI [name] is a 62 y.o. male.  62 yo M with a chief complaint of nausea and diarrhea.  This been going on for the past couple days.  Been febrile at home to 102.  He denies sick contacts.  Denies overt abdominal pain.  Denies pain or drainage at his driveline site.  The patient has an LVAD.  Does not feel that his had any significant warnings.  He has had a mild cough.  Denies shortness of breath.  The history is provided by the patient.  Illness  This is a new problem. The current episode started 2 days ago. The problem occurs constantly. The problem has been gradually worsening. Pertinent negatives include no chest pain, no abdominal pain, no headaches and no shortness of breath. Nothing aggravates the symptoms. Nothing relieves the symptoms. He has tried nothing for the symptoms. The treatment provided no relief.    Past Medical History:  Diagnosis Date  . AICD (automatic cardioverter/defibrillator) present   . Arthritis   . Barretts syndrome   . Carotid artery disease ([place])    a. Dopp 09/2011: 40-98% RICA, 11-91% LICA.;  b.  Carotid US (7/14):  Bilateral 60-79% => f/u 6 mos  . Cerebrovascular disease   . Chronic systolic heart failure ([place])   . Coronary artery disease    a. AWMI requiring IABP 2005 s/p Horizon study stent-LAD, staged BMS-Cx, DESx2-RCA. b. Last Last LHC (6/06):  EF 25%, pLAD stent 40-50, after stent 40, dLAD 20, pCFX 20, pOM1 70, pRCA 20, mRCA stents ok.=> med Rx;  c.  Myoview (7/14):  EF 26%, large ant, septal, inf and apical infarct, no ischemia.  Med Rx continued  . Diabetes mellitus   . Dyspnea    with exertion  . Headache   . Heart murmur   . History of kidney stones   . HTN (hypertension)   . Hyperlipidemia    mixed  . ICD  (implantable cardiac defibrillator), dual, in situ    St. Jude for severe LVD EF 25% 2/07 explanted 2010. Medtronic Virtuoso II DR Dual-chamber cardiverter-defibrillator  with pocket revision, Dr. Caryl Comes  . Ischemic cardiomyopathy    a. Echo (09/27/12): EF 20%, diffuse HK with periapical AK, no LV thrombus noted, restrictive physiology, trivial MR, mild LAE, RVSF mildly reduced, PASP 39  . Pleural effusion 01/05/2016  . Presence of permanent cardiac pacemaker   . PVD (peripheral vascular disease) (HCC)    a. ABI 09/2011 - R normal, L moderate - saw Dr. Fletcher Anon - med rx.   Marland Kitchen RIATA ICD Lead--on advisory recall    . Tobacco abuse     Patient Active Problem List   Diagnosis Date Noted  . Septic shock ([place]) 06/06/2017  . Syncope and collapse 04/11/2017  . Fall 04/11/2017  . Low back pain 04/11/2017  . PVC's (premature ventricular contractions)   . Acute upper GI bleed 07/15/2016  . Symptomatic anemia 07/07/2016  . Left ventricular assist device present ([place]) 06/16/2016  . Acute on chronic systolic (congestive) heart failure ([place]) 05/24/2016  . Low output heart failure ([place]) 04/05/2016  . Ischemic cardiomyopathy   . Pleural effusion on right   . Diabetes mellitus with complication ([place])   . Hypoglycemia 03/13/2015  .  Chronic systolic CHF (congestive heart failure) ([place]) 03/13/2015  . Pressure ulcer 12/31/2014  . Pleural effusion 12/30/2014  . Hyperglycemia without ketosis   . Infected open wound 09/22/2014  . Type 2 diabetes mellitus ([place]) 09/22/2014  . Skin ulcer ([place]) 09/21/2014  . PAD (peripheral artery disease) ([place]) 11/07/2013  . PVD (peripheral vascular disease) ([place])   . Pre-operative cardiovascular examination   . Metabolic alkalosis 93/79/0240  . Hypotension 10/01/2012  . Carotid stenosis 10/25/2011  . Hypertension 08/09/2010  . Automatic implantable cardioverter-defibrillator in St. Jude 08/09/2010  . Coronary atherosclerosis 01/21/2009  . TOBACCO ABUSE 05/15/2008  .  Cerebrovascular disease 05/15/2008  . Peripheral vascular disease ([place]) 05/15/2008  . Hyperlipidemia 01/22/2008  . SYSTOLIC HEART FAILURE, CHRONIC 01/22/2008    Past Surgical History:  Procedure Laterality Date  . ABDOMINAL AORTAGRAM N/A 11/06/2013   Procedure: ABDOMINAL Maxcine Ham;  Surgeon: Angelia Mould, MD;  Location: [facility] CATH LAB;  Service: Cardiovascular;  Laterality: N/A;  . APPENDECTOMY  2000   ruptured  . CARDIAC CATHETERIZATION N/A 01/07/2016   Procedure: Right/Left Heart Cath and Coronary Angiography;  Surgeon: Larey Dresser, MD;  Location: [facility] CV LAB;  Service: Cardiovascular;  Laterality: N/A;  . CARDIAC CATHETERIZATION N/A 01/28/2016   Procedure: Coronary Stent Intervention;  Surgeon: Sherren Mocha, MD;  Location: [facility] CV LAB;  Service: Cardiovascular;  Laterality: N/A;  . CARDIAC CATHETERIZATION N/A 01/28/2016   Procedure: Right Heart Cath;  Surgeon: Sherren Mocha, MD;  Location: [facility] CV LAB;  Service: Cardiovascular;  Laterality: N/A;  . CARDIAC CATHETERIZATION N/A 01/28/2016   Procedure: Intravascular Ultrasound/IVUS;  Surgeon: Sherren Mocha, MD;  Location: [facility] CV LAB;  Service: Cardiovascular;  Laterality: N/A;  . CHEST TUBE INSERTION Right 04/07/2016   Procedure: INSERTION PLEURAL DRAINAGE CATHETER;  Surgeon: Ivin Poot, MD;  Location: [facility];  Service: Thoracic;  Laterality: Right;  . ENTEROSCOPY N/A 07/10/2016   Procedure: ENTEROSCOPY;  Surgeon: Laurence Spates, MD;  Location: [facility] ENDOSCOPY;  Service: Endoscopy;  Laterality: N/A;  . FEMORAL-POPLITEAL BYPASS GRAFT Right 11/07/2013   Procedure: Right FEMORAL-POPLITEAL ARTERY Bypass ;  Surgeon: Angelia Mould, MD;  Location: [facility];  Service: Vascular;  Laterality: Right;  . FEMORAL-POPLITEAL BYPASS GRAFT Left 11/17/2014   Procedure: BYPASS GRAFT FEMORAL-POPLITEAL ARTERY USING GORE PROPATEN 6MM X 80CM VASCULAR GRAFT;  Surgeon: Angelia Mould, MD;  Location: [facility];  Service:  Vascular;  Laterality: Left;  . HOT HEMOSTASIS N/A 07/10/2016   Procedure: HOT HEMOSTASIS (ARGON PLASMA COAGULATION/BICAP);  Surgeon: Laurence Spates, MD;  Location: [facility] ENDOSCOPY;  Service: Endoscopy;  Laterality: N/A;  . IMPLANTABLE CARDIOVERTER DEFIBRILLATOR (ICD) GENERATOR CHANGE N/A 12/16/2012   Procedure: ICD GENERATOR CHANGE;  Surgeon: Deboraha Sprang, MD;  Location: [facility] CATH LAB;  Service: Cardiovascular;  Laterality: N/A;  . INSERTION OF IMPLANTABLE LEFT VENTRICULAR ASSIST DEVICE N/A 05/30/2016   Procedure: INSERTION OF IMPLANTABLE LEFT VENTRICULAR ASSIST DEVICE;  Surgeon: Ivin Poot, MD;  Location: [facility];  Service: Open Heart Surgery;  Laterality: N/A;  CIRC ARREST  NITRIC OXIDE  . INTRAOPERATIVE ARTERIOGRAM Right 11/07/2013   Procedure: INTRA OPERATIVE ARTERIOGRAM;  Surgeon: Angelia Mould, MD;  Location: [facility];  Service: Vascular;  Laterality: Right;  . LOWER EXTREMITY ANGIOGRAM Bilateral 11/06/2013   Procedure: LOWER EXTREMITY ANGIOGRAM;  Surgeon: Angelia Mould, MD;  Location: [facility] CATH LAB;  Service: Cardiovascular;  Laterality: Bilateral;  . Medtronic Virtuoso II DR dual-chamber cardioverter-defibrillation with pocket revison     Dr. Virl Axe  .  MULTIPLE EXTRACTIONS WITH ALVEOLOPLASTY N/A 04/26/2016   Procedure: Extraction of tooth #'s 5-14 and 20-30 with alveoloplasty;  Surgeon: Lenn Cal, DDS;  Location: [facility];  Service: Oral Surgery;  Laterality: N/A;  . PERIPHERAL VASCULAR CATHETERIZATION N/A 09/25/2014   Procedure: Abdominal Aortogram;  Surgeon: Elam Dutch, MD;  Location: [facility] CV LAB;  Service: Cardiovascular;  Laterality: N/A;  . REMOVAL OF PLEURAL DRAINAGE CATHETER Right 06/13/2016   Procedure: REMOVAL OF PLEURAL DRAINAGE CATHETER;  Surgeon: Ivin Poot, MD;  Location: [facility];  Service: Thoracic;  Laterality: Right;  . RIGHT HEART CATH N/A 05/01/2016   Procedure: Right Heart Cath;  Surgeon: Larey Dresser, MD;  Location: [place] CV  LAB;  Service: Cardiovascular;  Laterality: N/A;  . RIGHT HEART CATH N/A 05/25/2016   Procedure: Right Heart Cath;  Surgeon: Larey Dresser, MD;  Location: [facility] CV LAB;  Service: Cardiovascular;  Laterality: N/A;  . TEE WITHOUT CARDIOVERSION N/A 05/30/2016   Procedure: TRANSESOPHAGEAL ECHOCARDIOGRAM (TEE);  Surgeon: Prescott Gum, Collier Salina, MD;  Location: [facility];  Service: Open Heart Surgery;  Laterality: N/A;  . US GUIDED THORACENTESIS RIGHT ([place] HX) Right 01/05/2016        Home Medications    Prior to Admission medications   Medication Sig Start Date End Date Taking? Authorizing Provider  amoxicillin-clavulanate (AUGMENTIN) 875-125 MG tablet Take 1 tablet by mouth 2 (two) times daily. 06/06/17   Edrick Kins, DPM  atorvastatin (LIPITOR) 80 MG tablet Take 1 tablet (80 mg total) by mouth daily. 04/17/17   Clegg, Amy D, NP  blood glucose meter kit and supplies Ultra Blue Kit Dispense based on patient and insurance preference. Use up to four times daily as directed. (FOR ICD-9 250.00, 250.01). 08/04/16   Larey Dresser, MD  danazol (DANOCRINE) 100 MG capsule Take 1 capsule (100 mg total) by mouth 2 (two) times daily. 05/18/17   Shirley Friar, PA-C  ezetimibe (ZETIA) 10 MG tablet Take 1 tablet (10 mg total) by mouth daily. 05/04/17   Larey Dresser, MD  Ferrous Sulfate (IRON) 325 (65 Fe) MG TABS Take 1 tablet (325 mg total) by mouth 2 (two) times daily. 04/16/17   Clegg, Amy D, NP  insulin NPH-regular Human (NOVOLIN 70/30) (70-30) 100 UNIT/ML injection Inject 12 Units into the skin 2 (two) times daily.     [provider]  losartan (COZAAR) 50 MG tablet Take 1 tablet (50 mg total) by mouth 2 (two) times daily. 04/16/17   Clegg, Amy D, NP  metoCLOPramide (REGLAN) 5 MG tablet Take 1 tablet (5 mg total) by mouth 3 (three) times daily before meals. Patient taking differently: Take 5 mg by mouth 2 (two) times daily.  04/16/17 04/16/18  Clegg, Amy D, NP  oxyCODONE-acetaminophen  (PERCOCET/ROXICET) 5-325 MG tablet Take 1-2 tablets by mouth every 8 (eight) hours as needed for moderate pain. Patient not taking: Reported on 05/17/2017 05/04/17   Shirley Friar, PA-C  pantoprazole (PROTONIX) 40 MG tablet Take 1 tablet (40 mg total) by mouth 2 (two) times daily. 05/04/17   Larey Dresser, MD  potassium chloride SA (K-DUR,KLOR-CON) 20 MEQ tablet Take 3 tablets (60 mEq total) by mouth daily. 05/17/17   Larey Dresser, MD  sildenafil (REVATIO) 20 MG tablet Take 1 tablet (20 mg total) by mouth 3 (three) times daily. Patient not taking: Reported on 05/17/2017 05/15/17   Larey Dresser, MD  spironolactone (ALDACTONE) 25 MG tablet Take 0.5 tablets (12.5  mg total) by mouth daily. 04/17/17   Clegg, Amy D, NP  sucralfate (CARAFATE) 1 g tablet Take 1 tablet (1 g total) by mouth 4 (four) times daily -  with meals and at bedtime. 04/16/17   Clegg, Amy D, NP  torsemide (DEMADEX) 20 MG tablet Take 40 mg in am and 20 mg in pm or as directed 05/17/17   Larey Dresser, MD  traMADol (ULTRAM) 50 MG tablet Take 1-2 tablets (50-100 mg total) by mouth every 6 (six) hours as needed. 05/04/17   Shirley Friar, PA-C  warfarin (COUMADIN) 5 MG tablet 5 mg. Take 1 tablet (5 mg) daily    [provider]    Family History Family History  Problem Relation Age of Onset  . Diabetes Mother   . Coronary artery disease Father   . Heart disease Father        before age 86    Social History Social History   Tobacco Use  . Smoking status: Current Every Day Smoker    Packs/day: 0.50    Years: 45.00    Pack years: 22.50    Types: Cigarettes  . Smokeless tobacco: Never Used  . Tobacco comment: pt reports he is stress smoker  Substance Use Topics  . Alcohol use: No    Alcohol/week: 0.0 oz  . Drug use: No    Comment: former Cocain, Acid, Marijuana - 25 years ago     Allergies   Metformin and related and Niacin and related   Review of Systems Review of Systems    Constitutional: Negative for chills and fever.  HENT: Negative for congestion and facial swelling.   Eyes: Negative for discharge and visual disturbance.  Respiratory: Negative for shortness of breath.   Cardiovascular: Negative for chest pain and palpitations.  Gastrointestinal: Positive for diarrhea and nausea. Negative for abdominal pain and vomiting.  Musculoskeletal: Negative for arthralgias and myalgias.  Skin: Negative for color change and rash.  Neurological: Negative for tremors, syncope and headaches.  Psychiatric/Behavioral: Negative for confusion and dysphoric mood.     Physical Exam Updated Vital Signs BP 90/72   Pulse (!) 102   Temp (!) 102.8 F (39.3 C) (Oral)   Resp (!) 34   Ht 6' 1"  (1.854 m)   Wt 104.3 kg (230 lb)   SpO2 97%   BMI 30.34 kg/m   Physical Exam  Constitutional: He is oriented to person, place, and time. He appears well-developed and well-nourished.  HENT:  Head: Normocephalic and atraumatic.  Eyes: Pupils are equal, round, and reactive to light. EOM are normal.  Neck: Normal range of motion. Neck supple. No JVD present.  Cardiovascular:  Audible hum  Pulmonary/Chest: No stridor. No respiratory distress. He has no wheezes. He has no rales.  Abdominal: He exhibits no distension and no mass. There is no tenderness. There is no rebound and no guarding.  Musculoskeletal: Normal range of motion. He exhibits edema (2+ bilaterally).  Right second toe with medial ulceration with purulent drainage, foul-smelling.  No significant extending erythema or edema.  Neurological: He is alert and oriented to person, place, and time.  Skin: No rash noted. No pallor.  Psychiatric: He has a normal mood and affect. His behavior is normal.  Nursing note and vitals reviewed.    ED Treatments / Results  Labs (all labs ordered are listed, but only abnormal results are displayed) Labs Reviewed  COMPREHENSIVE METABOLIC PANEL - Abnormal; Notable for the following  components:  Result Value   Sodium 129 (*)    Chloride 95 (*)    Glucose, Bld 202 (*)    Creatinine, Ser 1.82 (*)    Calcium 8.3 (*)    Albumin 2.9 (*)    Total Bilirubin 1.3 (*)    GFR calc non Af Amer 38 (*)    GFR calc Af Amer 44 (*)    All other components within normal limits  CBC WITH DIFFERENTIAL/PLATELET - Abnormal; Notable for the following components:   WBC 11.5 (*)    RBC 3.49 (*)    Hemoglobin 9.6 (*)    HCT 29.6 (*)    RDW 17.6 (*)    Platelets 139 (*)    Neutro Abs 10.6 (*)    Lymphs Abs 0.2 (*)    All other components within normal limits  PROTIME-INR - Abnormal; Notable for the following components:   Prothrombin Time 25.6 (*)    All other components within normal limits  LACTATE DEHYDROGENASE - Abnormal; Notable for the following components:   LDH 201 (*)    All other components within normal limits  CULTURE, BLOOD (ROUTINE X 2)  CULTURE, BLOOD (ROUTINE X 2)  URINALYSIS, ROUTINE W REFLEX MICROSCOPIC  SEDIMENTATION RATE  LACTATE DEHYDROGENASE  PROTIME-INR  CBC  BASIC METABOLIC PANEL  I-STAT CG4 LACTIC ACID, ED  I-STAT CG4 LACTIC ACID, ED    EKG EKG Interpretation  Date/Time:  Wednesday June 06 2017 22:29:33 EDT Ventricular Rate:  105 PR Interval:    QRS Duration: 147 QT Interval:  280 QTC Calculation: 370 R Axis:   -56 Text Interpretation:  Sinus tachycardia Ventricular premature complex Probable left atrial enlargement Anterior infarct, old Lateral leads are also involved Artifact in lead(s) II III aVR aVL aVF V1 V2 V3 V4 V5 V6 and baseline wander in lead(s) II III aVF No significant change since last tracing Confirmed by Deno Etienne [phone]) on 06/06/2017 11:11:52 PM   Radiology Dg Chest Port 1 View  Result Date: 06/06/2017 CLINICAL DATA:  Chills, weakness, and body aches. EXAM: PORTABLE CHEST 1 VIEW COMPARISON:  Chest x-ray dated April 11, 2017. FINDINGS: The bilateral costophrenic angles are not included in the field of view. Unchanged single  lead left chest wall pacer device. Partially visualized LVAD. Stable cardiomediastinal silhouette. Normal pulmonary vascularity. Stable bibasilar scarring. No consolidation, pneumothorax, or large pleural effusion. No acute osseous abnormality. Old right-sided rib fractures again noted. IMPRESSION: No active disease. Electronically Signed   By: Titus Dubin M.D.   On: 06/06/2017 23:45   Dg Foot Complete Right  Result Date: 06/06/2017 Please see detailed radiograph report in office note.   Procedures .Central Line Date/Time: 06/07/2017 12:30 AM Performed by: Deno Etienne, DO Authorized by: Deno Etienne, DO   Consent:    Consent obtained:  Verbal   Consent given by:  Patient and parent   Risks discussed:  Arterial puncture, incorrect placement, infection, nerve damage and pneumothorax   Alternatives discussed:  No treatment, delayed treatment and alternative treatment Pre-procedure details:    Hand hygiene: Hand hygiene performed prior to insertion     Sterile barrier technique: All elements of maximal sterile technique followed     Skin preparation:  2% chlorhexidine and ChloraPrep Anesthesia (see MAR for exact dosages):    Anesthesia method:  Local infiltration   Local anesthetic:  Lidocaine 1% w/o epi Procedure details:    Location:  R internal jugular   Patient position:  Trendelenburg   Procedural supplies:  Triple lumen  Landmarks identified: yes     Ultrasound guidance: yes     Sterile ultrasound techniques: Sterile gel and sterile probe covers were used     Number of attempts:  1   Successful placement: yes   Post-procedure details:    Post-procedure:  Dressing applied and line sutured   Assessment:  Blood return through all ports, free fluid flow, no pneumothorax on x-ray and placement verified by x-ray   Patient tolerance of procedure:  Tolerated well, no immediate complications   (including critical care time)  Medications Ordered in ED Medications  vancomycin  (VANCOCIN) 2,000 mg in sodium chloride 0.9 % 500 mL IVPB (2,000 mg Intravenous New Bag/Given 06/06/17 2338)  acetaminophen (TYLENOL) tablet 650 mg (has no administration in time range)  ondansetron (ZOFRAN) injection 4 mg (has no administration in time range)  sodium chloride flush (NS) 0.9 % injection 3 mL (has no administration in time range)  sodium chloride flush (NS) 0.9 % injection 3 mL (has no administration in time range)  0.9 %  sodium chloride infusion (has no administration in time range)  acetaminophen (TYLENOL) tablet 650 mg (has no administration in time range)    Or  acetaminophen (TYLENOL) suppository 650 mg (has no administration in time range)  atorvastatin (LIPITOR) tablet 80 mg (has no administration in time range)  danazol (DANOCRINE) capsule 100 mg (has no administration in time range)  ezetimibe (ZETIA) tablet 10 mg (has no administration in time range)  ferrous sulfate tablet 325 mg (has no administration in time range)  insulin aspart protamine- aspart (NOVOLOG MIX 70/30) injection 12 Units (has no administration in time range)  metoCLOPramide (REGLAN) tablet 5 mg (has no administration in time range)  pantoprazole (PROTONIX) EC tablet 40 mg (has no administration in time range)  sildenafil (REVATIO) tablet 20 mg (has no administration in time range)  piperacillin-tazobactam (ZOSYN) IVPB 3.375 g (has no administration in time range)  vancomycin (VANCOCIN) 1,250 mg in sodium chloride 0.9 % 250 mL IVPB (has no administration in time range)  insulin aspart (novoLOG) injection 0-15 Units (has no administration in time range)  insulin aspart (novoLOG) injection 0-5 Units (has no administration in time range)  sodium chloride 0.9 % bolus 1,000 mL (0 mLs Intravenous Stopped 06/06/17 2251)  piperacillin-tazobactam (ZOSYN) IVPB 3.375 g (0 g Intravenous Stopped 06/07/17 0013)  sodium chloride 0.9 % bolus 1,000 mL (0 mLs Intravenous Stopped 06/06/17 2337)  lidocaine (PF) (XYLOCAINE) 1 %  injection 5 mL (5 mLs Intradermal Given by Other 06/07/17 0000)     Initial Impression / Assessment and Plan / ED Course  I have reviewed the triage vital signs and the nursing notes.  Pertinent labs & imaging results that were available during my care of the patient were reviewed by me and considered in my medical decision making (see chart for details).     62 yo M with a chief complaint of fever nausea diarrhea and fatigue.  Immediately assessed by the LVAD team on arrival.  Patient with hypotension.  Lactate normal.  Given IV fluid, Cards to admit.   CRITICAL CARE Performed by: Cecilio Asper   Total critical care time: 80 minutes  Critical care time was exclusive of separately billable procedures and treating other patients.  Critical care was necessary to treat or prevent imminent or life-threatening deterioration.  Critical care was time spent personally by me on the following activities: development of treatment plan with patient and/or surrogate as well as nursing, discussions with consultants,  evaluation of patient's response to treatment, examination of patient, obtaining history from patient or surrogate, ordering and performing treatments and interventions, ordering and review of laboratory studies, ordering and review of radiographic studies, pulse oximetry and re-evaluation of patient's condition.  The patients results and plan were reviewed and discussed.   Any x-rays performed were independently reviewed by myself.   Differential diagnosis were considered with the presenting HPI.  Medications  vancomycin (VANCOCIN) 2,000 mg in sodium chloride 0.9 % 500 mL IVPB (2,000 mg Intravenous New Bag/Given 06/06/17 2338)  acetaminophen (TYLENOL) tablet 650 mg (has no administration in time range)  ondansetron (ZOFRAN) injection 4 mg (has no administration in time range)  sodium chloride flush (NS) 0.9 % injection 3 mL (has no administration in time range)  sodium chloride  flush (NS) 0.9 % injection 3 mL (has no administration in time range)  0.9 %  sodium chloride infusion (has no administration in time range)  acetaminophen (TYLENOL) tablet 650 mg (has no administration in time range)    Or  acetaminophen (TYLENOL) suppository 650 mg (has no administration in time range)  atorvastatin (LIPITOR) tablet 80 mg (has no administration in time range)  danazol (DANOCRINE) capsule 100 mg (has no administration in time range)  ezetimibe (ZETIA) tablet 10 mg (has no administration in time range)  ferrous sulfate tablet 325 mg (has no administration in time range)  insulin aspart protamine- aspart (NOVOLOG MIX 70/30) injection 12 Units (has no administration in time range)  metoCLOPramide (REGLAN) tablet 5 mg (has no administration in time range)  pantoprazole (PROTONIX) EC tablet 40 mg (has no administration in time range)  sildenafil (REVATIO) tablet 20 mg (has no administration in time range)  piperacillin-tazobactam (ZOSYN) IVPB 3.375 g (has no administration in time range)  vancomycin (VANCOCIN) 1,250 mg in sodium chloride 0.9 % 250 mL IVPB (has no administration in time range)  insulin aspart (novoLOG) injection 0-15 Units (has no administration in time range)  insulin aspart (novoLOG) injection 0-5 Units (has no administration in time range)  sodium chloride 0.9 % bolus 1,000 mL (0 mLs Intravenous Stopped 06/06/17 2251)  piperacillin-tazobactam (ZOSYN) IVPB 3.375 g (0 g Intravenous Stopped 06/07/17 0013)  sodium chloride 0.9 % bolus 1,000 mL (0 mLs Intravenous Stopped 06/06/17 2337)  lidocaine (PF) (XYLOCAINE) 1 % injection 5 mL (5 mLs Intradermal Given by Other 06/07/17 0000)    Vitals:   06/06/17 2220 06/06/17 2230 06/06/17 2248 06/06/17 2257  BP: (!) 81/63 (!) 88/57 (!) 74/53 90/72  Pulse: (!) 108 (!) 110 (!) 102   Resp: (!) 26 (!) 32 (!) 34   Temp:      TempSrc:      SpO2: 92% 92% 97%   Weight: 104.3 kg (230 lb)     Height: 6' 1"  (1.854 m)       Final  diagnoses:  Septic shock (HCC)    Admission/ observation were discussed with the admitting physician, patient and/or family and they are comfortable with the plan.    Final Clinical Impressions(s) / ED Diagnoses   Final diagnoses:  Septic shock [facility])    ED Discharge Orders    None       Deno Etienne, DO 06/07/17 [id number]

## 2017-06-06 NOTE — ED Notes (Signed)
Fredricka Bonineonnor, EMT recorded manual MAP of 80

## 2017-06-06 NOTE — ED Triage Notes (Addendum)
[facility], pt arrives from home with c/o chills, weakness and body aches x3 days. Pt had oral temp of 103.6 and was given 1 g of tylenol and 500 NS bolus. Pt alert and oriented x3. Pt has LVAD. Manual BP of 90/60. Pt reports ulcer to second toe on right foot.

## 2017-06-06 NOTE — Addendum Note (Signed)
Addended by: Jacklynn BueSTEPHENS, Adalyna Godbee on: 06/06/2017 11:24 AM   Modules accepted: Orders

## 2017-06-06 NOTE — Addendum Note (Signed)
Addended by: Felecia ShellingEVANS, Ceferino Lang M on: 06/06/2017 10:15 AM   Modules accepted: Orders

## 2017-06-06 NOTE — ED Notes (Signed)
Nurse drawing all labs 

## 2017-06-06 NOTE — ED Notes (Signed)
Pt placed on 2L [place]

## 2017-06-06 NOTE — Progress Notes (Signed)
   Subjective:  Patient with a history of diabetes mellitus presents today for evaluation ulceration(s) to the lower extremitie(s). Patient states that he did have a history of diabetes mellitus.  Patient has a history of peripheral vascular disease and has been seen in the past by [facility]ristopher [facility]  Patient also has a history of osteomyelitis.  He states that the ulcer to his right second toe has been present at least for a few weeks.  His father is been changing dressings.  Most of his medical treatments are performed at [facility] Patient presents today for further treatment and evaluation   Objective/Physical Exam General: The patient is alert and oriented x3 in no acute distress.  Dermatology:  Wound #1 noted to the medial aspect of the second digit 1.0 x 1.0 x 0.5 cm (LxWxD).   To the noted ulceration(s), there is no eschar. There is a moderate amount of slough, fibrin, and necrotic tissue noted. Granulation tissue and wound base is red. There is a minimal amount of serosanguineous drainage noted. There is no exposed bone muscle-tendon ligament or joint. There is no malodor. Periwound integrity is intact. Skin is warm, dry and supple bilateral lower extremities.  Vascular: Palpable pedal pulses bilaterally. No edema or erythema noted. Capillary refill within normal limits.  Neurological: Epicritic and protective threshold diminished bilaterally.   Musculoskeletal Exam: Range of motion within normal limits to all pedal and ankle joints bilateral. Muscle strength 5/5 in all groups bilateral.   Radiographic exam: There is some cortical erosion of the medial aspect of the phalanx along the second digit right foot just underlying the area of the ulceration.  Consistent with osteomyelitis.  Assessment: #1  Ulcer second toe right foot secondary to diabetes mellitus #2 diabetes mellitus w/ peripheral neuropathy #3 peripheral vascular  disease #4 osteomyelitis second toe right foot   Plan of Care:  #1 Patient was evaluated. #2 medically necessary excisional debridement including subcutaneous tissue was performed using a tissue nipper and a chisel blade. Excisional debridement of all the necrotic nonviable tissue down to healthy bleeding viable tissue was performed with post-debridement measurements same as pre-. #3 the wound was cleansed and dry sterile dressing applied. #4  Cultures were taken and sent to pathology for culture and sensitivity #5 antibiotic Bactrim DS #28 #6 I explained to the patient that he does require second toe amputation due to the underlying osteomyelitis.  All possible complications and details the procedure were explained.  All patient questions were answered.  No guarantees were expressed or implied. #7 authorization for surgery initiated today.  Surgery will consist of amputation second digit right foot.  Incision and drainage right foot. #8 prior to surgery he will require medical clearance from his PCP and also vascular consultation and vascular clearance. #9 return to clinic 1 week postop  Felecia ShellingBrent M. Codee Bloodworth, DPM [facility]  Dr. Felecia ShellingBrent M. Narek Kniss, DPM    [address]                                        [place], [place] [address]                Office [phone]  Fax [phone]) [phone]

## 2017-06-06 NOTE — ED Notes (Signed)
X-ray at bedside

## 2017-06-06 NOTE — ED Notes (Signed)
Pharmacy notified to send vancomycin 

## 2017-06-06 NOTE — ED Notes (Signed)
Flloyd, MD at bedside attempting to place central line.

## 2017-06-06 NOTE — Progress Notes (Signed)
Patient ID: [name], male   DOB: 1955/04/27, 62 y.o.   MRN: [id number]             VAD TEAM History & Physical Note   Reason for Admission: Suspected sepsis   HPI:    [name] is a 62 y.o. male smoker with history of PAD, CAD, and ischemic cardiomyopathy s/p HVAD placement on 05/30/16.     He was admitted in 7/18 with upper GI bleed.  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  He had 3 units PRBCs.  ASA was decreased from 325 to 81 daily and warfarin goal was decreased to 2-2.5.  Octreotide was started but he developed profuse diarrhea that seems to have been related to octreotide.  This was stopped and he was started on danazol.  ASA was later stopped with another episode of melena.   He has has chronic calf claudication on left after walking a moderate distance.  This is followed by [facility]. His left fem-pop graft has occluded.    Admitted 4/10 through 04/16/2017 for syncope thought to be the result of a vagal episode. He was found to have L1 fracture with severe back pain. Neurosurgery consulted with back brace applied.   At last appointment, he was doing well except for ongoing low back pain.  However, recently he developed an ulceration on his right 2nd toe.  He saw podiatry today and was thought to have osteomyelitis.  He was started on Bactrim and planned for amputation. He had been feeling generalized malaise for several days.  He went home after his appointment and laid down.  According to his father he was unable to get back up and was poorly responsive. Father called EMS.  When they arrived, his temperature was 103.  He was brought to the ER.  MAP initially low, most recently 80.  Lactate not elevated but creatinine up to 1.8.  WBCs mildly elevated.  LDH stable.  He will answer questions but still with malaise and drowsiness.  CXR clear.  He is in NSR.    LVAD INTERROGATION:  [employer] LVAD:  Flow 3.3 liters/min, speed 2700, power 4.3.  No recent  alarms  Review of Systems: All systems reviewed and negative except as per HPI.   Home Medications Prior to Admission medications   Medication Sig Start Date End Date Taking? Authorizing Provider  amoxicillin-clavulanate (AUGMENTIN) 875-125 MG tablet Take 1 tablet by mouth 2 (two) times daily. 06/06/17   Edrick Kins, DPM  atorvastatin (LIPITOR) 80 MG tablet Take 1 tablet (80 mg total) by mouth daily. 04/17/17   Clegg, Amy D, NP  blood glucose meter kit and supplies Ultra Blue Kit Dispense based on patient and insurance preference. Use up to four times daily as directed. (FOR ICD-9 250.00, 250.01). 08/04/16   Larey Dresser, MD  danazol (DANOCRINE) 100 MG capsule Take 1 capsule (100 mg total) by mouth 2 (two) times daily. 05/18/17   Shirley Friar, PA-C  ezetimibe (ZETIA) 10 MG tablet Take 1 tablet (10 mg total) by mouth daily. 05/04/17   Larey Dresser, MD  Ferrous Sulfate (IRON) 325 (65 Fe) MG TABS Take 1 tablet (325 mg total) by mouth 2 (two) times daily. 04/16/17   Clegg, Amy D, NP  insulin NPH-regular Human (NOVOLIN 70/30) (70-30) 100 UNIT/ML injection Inject 12 Units into the skin 2 (two) times daily.     [provider]  losartan (COZAAR) 50 MG tablet Take 1 tablet (  50 mg total) by mouth 2 (two) times daily. 04/16/17   Clegg, Amy D, NP  metoCLOPramide (REGLAN) 5 MG tablet Take 1 tablet (5 mg total) by mouth 3 (three) times daily before meals. Patient taking differently: Take 5 mg by mouth 2 (two) times daily.  04/16/17 04/16/18  Clegg, Amy D, NP  oxyCODONE-acetaminophen (PERCOCET/ROXICET) 5-325 MG tablet Take 1-2 tablets by mouth every 8 (eight) hours as needed for moderate pain. Patient not taking: Reported on 05/17/2017 05/04/17   Shirley Friar, PA-C  pantoprazole (PROTONIX) 40 MG tablet Take 1 tablet (40 mg total) by mouth 2 (two) times daily. 05/04/17   Larey Dresser, MD  potassium chloride SA (K-DUR,KLOR-CON) 20 MEQ tablet Take 3 tablets (60 mEq total) by mouth  daily. 05/17/17   Larey Dresser, MD  sildenafil (REVATIO) 20 MG tablet Take 1 tablet (20 mg total) by mouth 3 (three) times daily. Patient not taking: Reported on 05/17/2017 05/15/17   Larey Dresser, MD  spironolactone (ALDACTONE) 25 MG tablet Take 0.5 tablets (12.5 mg total) by mouth daily. 04/17/17   Clegg, Amy D, NP  sucralfate (CARAFATE) 1 g tablet Take 1 tablet (1 g total) by mouth 4 (four) times daily -  with meals and at bedtime. 04/16/17   Clegg, Amy D, NP  torsemide (DEMADEX) 20 MG tablet Take 40 mg in am and 20 mg in pm or as directed 05/17/17   Larey Dresser, MD  traMADol (ULTRAM) 50 MG tablet Take 1-2 tablets (50-100 mg total) by mouth every 6 (six) hours as needed. 05/04/17   Shirley Friar, PA-C  warfarin (COUMADIN) 5 MG tablet 5 mg. Take 1 tablet (5 mg) daily    [provider]    Past Medical History: Past Medical History:  Diagnosis Date  . AICD (automatic cardioverter/defibrillator) present   . Arthritis   . Barretts syndrome   . Carotid artery disease ([place])    a. Dopp 09/2011: 09-23% RICA, 30-07% LICA.;  b.  Carotid US (7/14):  Bilateral 60-79% => f/u 6 mos  . Cerebrovascular disease   . Chronic systolic heart failure ([place])   . Coronary artery disease    a. AWMI requiring IABP 2005 s/p Horizon study stent-LAD, staged BMS-Cx, DESx2-RCA. b. Last Last LHC (6/06):  EF 25%, pLAD stent 40-50, after stent 40, dLAD 20, pCFX 20, pOM1 70, pRCA 20, mRCA stents ok.=> med Rx;  c.  Myoview (7/14):  EF 26%, large ant, septal, inf and apical infarct, no ischemia.  Med Rx continued  . Diabetes mellitus   . Dyspnea    with exertion  . Headache   . Heart murmur   . History of kidney stones   . HTN (hypertension)   . Hyperlipidemia    mixed  . ICD (implantable cardiac defibrillator), dual, in situ    St. Jude for severe LVD EF 25% 2/07 explanted 2010. Medtronic Virtuoso II DR Dual-chamber cardiverter-defibrillator  with pocket revision, Dr. Caryl Comes  . Ischemic  cardiomyopathy    a. Echo (09/27/12): EF 20%, diffuse HK with periapical AK, no LV thrombus noted, restrictive physiology, trivial MR, mild LAE, RVSF mildly reduced, PASP 39  . Pleural effusion 01/05/2016  . Presence of permanent cardiac pacemaker   . PVD (peripheral vascular disease) ([facility])    a. ABI 09/2011 - R normal, L moderate - saw Dr. Fletcher Anon - med rx.   Marland Kitchen RIATA ICD Lead--on advisory recall    . Tobacco abuse     Past  Surgical History: Past Surgical History:  Procedure Laterality Date  . ABDOMINAL AORTAGRAM N/A 11/06/2013   Procedure: ABDOMINAL Maxcine Ham;  Surgeon: Angelia Mould, MD;  Location: [facility] CATH LAB;  Service: Cardiovascular;  Laterality: N/A;  . APPENDECTOMY  2000   ruptured  . CARDIAC CATHETERIZATION N/A 01/07/2016   Procedure: Right/Left Heart Cath and Coronary Angiography;  Surgeon: Larey Dresser, MD;  Location: [place] CV LAB;  Service: Cardiovascular;  Laterality: N/A;  . CARDIAC CATHETERIZATION N/A 01/28/2016   Procedure: Coronary Stent Intervention;  Surgeon: Sherren Mocha, MD;  Location: [place] CV LAB;  Service: Cardiovascular;  Laterality: N/A;  . CARDIAC CATHETERIZATION N/A 01/28/2016   Procedure: Right Heart Cath;  Surgeon: Sherren Mocha, MD;  Location: [place] CV LAB;  Service: Cardiovascular;  Laterality: N/A;  . CARDIAC CATHETERIZATION N/A 01/28/2016   Procedure: Intravascular Ultrasound/IVUS;  Surgeon: Sherren Mocha, MD;  Location: [place] CV LAB;  Service: Cardiovascular;  Laterality: N/A;  . CHEST TUBE INSERTION Right 04/07/2016   Procedure: INSERTION PLEURAL DRAINAGE CATHETER;  Surgeon: Ivin Poot, MD;  Location: [place];  Service: Thoracic;  Laterality: Right;  . ENTEROSCOPY N/A 07/10/2016   Procedure: ENTEROSCOPY;  Surgeon: Laurence Spates, MD;  Location: [facility] ENDOSCOPY;  Service: Endoscopy;  Laterality: N/A;  . FEMORAL-POPLITEAL BYPASS GRAFT Right 11/07/2013   Procedure: Right FEMORAL-POPLITEAL ARTERY Bypass ;  Surgeon: Angelia Mould, MD;  Location: [place];  Service: Vascular;  Laterality: Right;  . FEMORAL-POPLITEAL BYPASS GRAFT Left 11/17/2014   Procedure: BYPASS GRAFT FEMORAL-POPLITEAL ARTERY USING GORE PROPATEN 6MM X 80CM VASCULAR GRAFT;  Surgeon: Angelia Mould, MD;  Location: [place];  Service: Vascular;  Laterality: Left;  . HOT HEMOSTASIS N/A 07/10/2016   Procedure: HOT HEMOSTASIS (ARGON PLASMA COAGULATION/BICAP);  Surgeon: Laurence Spates, MD;  Location: [facility] ENDOSCOPY;  Service: Endoscopy;  Laterality: N/A;  . IMPLANTABLE CARDIOVERTER DEFIBRILLATOR (ICD) GENERATOR CHANGE N/A 12/16/2012   Procedure: ICD GENERATOR CHANGE;  Surgeon: Deboraha Sprang, MD;  Location: [facility] C[facility] LAB;  Service: Cardiovascular;  Laterality: N/A;  . INSERTION OF IMPLANTABLE LEFT VENTRICULAR ASSIST DEVICE N/A 05/30/2016   Procedure: INSERTION OF IMPLANTABLE LEFT VENTRICULAR ASSIST DEVICE;  Surgeon: Ivin Poot, MD;  Location: [place];  Service: Open Heart Surgery;  Laterality: N/A;  CIRC ARREST  NITRIC OXIDE  . INTRAOPERATIVE ARTERIOGRAM Right 11/07/2013   Procedure: INTRA OPERATIVE ARTERIOGRAM;  Surgeon: Angelia Mould, MD;  Location: [place];  Service: Vascular;  Laterality: Right;  . LOWER EXTREMITY ANGIOGRAM Bilateral 11/06/2013   Procedure: LOWER EXTREMITY ANGIOGRAM;  Surgeon: Angelia Mould, MD;  Location: [facility] CATH LAB;  Service: Cardiovascular;  Laterality: Bilateral;  . Medtronic Virtuoso II DR dual-chamber cardioverter-defibrillation with pocket revison     Dr. Virl Axe  . MULTIPLE EXTRACTIONS WITH ALVEOLOPLASTY N/A 04/26/2016   Procedure: Extraction of tooth #'s 5-14 and 20-30 with alveoloplasty;  Surgeon: Lenn Cal, DDS;  Location: [place];  Service: Oral Surgery;  Laterality: N/A;  . PERIPHERAL VASCULAR CATHETERIZATION N/A 09/25/2014   Procedure: Abdominal Aortogram;  Surgeon: Elam Dutch, MD;  Location: [place] CV LAB;  Service: Cardiovascular;  Laterality: N/A;  . REMOVAL OF PLEURAL DRAINAGE  CATHETER Right 06/13/2016   Procedure: REMOVAL OF PLEURAL DRAINAGE CATHETER;  Surgeon: Ivin Poot, MD;  Location: [place];  Service: Thoracic;  Laterality: Right;  . RIGHT HEART CATH N/A 05/01/2016   Procedure: Right Heart Cath;  Surgeon: Larey Dresser, MD;  Location: [place] CV LAB;  Service: Cardiovascular;  Laterality: N/A;  . RIGHT HEART CATH N/A 05/25/2016   Procedure: Right Heart Cath;  Surgeon: Larey Dresser, MD;  Location: [place] CV LAB;  Service: Cardiovascular;  Laterality: N/A;  . TEE WITHOUT CARDIOVERSION N/A 05/30/2016   Procedure: TRANSESOPHAGEAL ECHOCARDIOGRAM (TEE);  Surgeon: Prescott Gum, Collier Salina, MD;  Location: [place];  Service: Open Heart Surgery;  Laterality: N/A;  . US GUIDED THORACENTESIS RIGHT ([place] HX) Right 01/05/2016    Family History: Family History  Problem Relation Age of Onset  . Diabetes Mother   . Coronary artery disease Father   . Heart disease Father        before age 45    Social History: Social History   Socioeconomic History  . Marital status: Divorced    Spouse name: Not on file  . Number of children: 2  . Years of education: Not on file  . Highest education level: Not on file  Occupational History  . 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 Financial resource strain: Not on file  . Food insecurity:    Worry: Not on file    Inability: Not on file  . Transportation needs:    Medical: Not on file    Non-medical: Not on file  Tobacco Use  . Smoking status: Current Every Day Smoker    Packs/day: 0.50    Years: 45.00    Pack years: 22.50    Types: Cigarettes  . Smokeless tobacco: Never Used  . Tobacco comment: pt reports he is stress smoker  Substance and Sexual Activity  . Alcohol use: No    Alcohol/week: 0.0 oz  . Drug use: No    Comment: former Cocain, Acid, Marijuana - 25 years ago  . Sexual activity: Never  Lifestyle  . Physical activity:    Days per week:  Not on file    Minutes per session: Not on file  . Stress: Not on file  Relationships  . Social connections:    Talks on phone: Not on file    Gets together: Not on file    Attends religious service: Not on file    Active member of club or organization: Not on file    Attends meetings of clubs or organizations: Not on file    Relationship status: Not on file  Other Topics Concern  . Not on file  Social History Narrative   Divorced. 2 children.   Previously worked at [employer].    Allergies:  Allergies  Allergen Reactions  . Metformin And Related Nausea And Vomiting    *Only the extended release* (does this)  . Niacin And Related Other (See Comments)    REACTION IS SIDE EFFECT "SEVERE" FLUSHING    Objective:    Vital Signs:   Temp:  [102.8 F (39.3 C)] 102.8 F (39.3 C) (06/05 2215) Pulse Rate:  [102-110] 102 (06/05 2248) Resp:  [19-34] 34 (06/05 2248) BP: (74-90)/(53-72) 90/72 (06/05 2257) SpO2:  [92 %-97 %] 97 % (06/05 2248) Weight:  [230 lb (104.3 kg)] 230 lb (104.3 kg) (06/05 2220)   Filed Weights   06/06/17 2220  Weight: 230 lb (104.3 kg)    Mean arterial Pressure 80  Physical Exam: General:  Ill appearing HEENT: normal Neck: supple. JVP 8-9 cm. Carotids 2+ bilat; no bruits. No lymphadenopathy or thryomegaly appreciated. Cor: Mechanical heart sounds with LVAD hum present. Lungs: clear Abdomen: soft, nontender, nondistended. No hepatosplenomegaly.  No bruits or masses. Good bowel sounds. Driveline: C/D/I; securement device intact and driveline incorporated Extremities: no cyanosis, clubbing, rash.  1+ edema right lower leg to knee.  Neuro: alert & orientedx3, cranial nerves grossly intact. moves all 4 extremities w/o difficulty. Affect pleasant  Telemetry: NSR (personally reviewed)  Labs: Basic Metabolic Panel: Recent Labs  Lab 06/06/17 2237  NA 129*  K 4.2  CL 95*  CO2 22  GLUCOSE 202*  BUN 15  CREATININE 1.82*  CALCIUM 8.3*    Liver  Function Tests: Recent Labs  Lab 06/06/17 2235-03-26  AST 32  ALT 25  ALKPHOS 61  BILITOT 1.3*  PROT 7.3  ALBUMIN 2.9*   No results for input(s): LIPASE, AMYLASE in the last 168 hours. No results for input(s): AMMONIA in the last 168 hours.  CBC: Recent Labs  Lab 06/06/17 2237  WBC 11.5*  NEUTROABS 10.6*  HGB 9.6*  HCT 29.6*  MCV 84.8  PLT 139*    Cardiac Enzymes: No results for input(s): CKTOTAL, CKMB, CKMBINDEX, TROPONINI in the last 168 hours.  BNP: BNP (last 3 results) No results for input(s): BNP in the last 8760 hours.  ProBNP (last 3 results) No results for input(s): PROBNP in the last 8760 hours.   CBG: No results for input(s): GLUCAP in the last 168 hours.  Coagulation Studies: Recent Labs    06/06/17 03/26/35  LABPROT 25.6*  INR 2.36    Other results: EKG: NSR, LBBB (personally reviewed)  Imaging: Dg Chest Port 1 View  Result Date: 06/06/2017 CLINICAL DATA:  Chills, weakness, and body aches. EXAM: PORTABLE CHEST 1 VIEW COMPARISON:  Chest x-ray dated April 11, 2017. FINDINGS: The bilateral costophrenic angles are not included in the field of view. Unchanged single lead left chest wall pacer device. Partially visualized LVAD. Stable cardiomediastinal silhouette. Normal pulmonary vascularity. Stable bibasilar scarring. No consolidation, pneumothorax, or large pleural effusion. No acute osseous abnormality. Old right-sided rib fractures again noted. IMPRESSION: No active disease. Electronically Signed   By: Titus Dubin M.D.   On: 06/06/2017 23:45   Dg Foot Complete Right  Result Date: 06/06/2017 Please see detailed radiograph report in office note.        Plan/Discussion:    1. ID: Suspect sepsis/septic shock.  MAP responded to fluid bolus.  Lactate not elevated.  Temperature to 103 with EMS.  Suspect source is likely osteomyelitis in right great toe.  CXR clear.  - Send blood cultures.  - Broad spectrum abx => vancomycin/Zosyn.  - Place CVL in  case pressors are needed. Will follow CVP off CVL.  - Follow BP closely, can give more IVF if needed.  - Ultimately will need right great toe amputation.  2. Chronic systolic CHF: Ischemic cardiomyopathy.  Echo in 3/18 with EF 20-25%.  St Jude ICD.  He is now s/p HVAD placement.  Lavare cycle is on, parameters stable on device with no alarms.  VAD interrogated, no alarms/parameters stable. LDH stable.  - Hold losartan and spironolactone with soft BP.  - Hold torsemide with AKI and sepsis.  - Hold warfarin for now for possible toe amputation and cover with heparin gtt when INR subtherapeutic.  3. CAD: 3 vessel CAD, s/p stent to proximal LAD and PTCA to subtotally occluded large OM1 in 1/18.  - Continue atorvastatin 80 mg daily.   4. PAD: Stable claudication, notes with moderate walking on left.  Left fem-pop bypass occluded. Now with right 2nd toe ulceration and suspected osteomyelitis.  - Will need  amputation.  Holding warfarin as above.  Will consult vascular.  5. Pulmonary arterial HTN: Continue Revatio 20 mg tid.  6. Smoking: Needs to quit.     7. Upper GI bleeding: 7/18, small bowel AVMs.  ASA was decreased to 81 daily.  Warfarin INR goal is now 2-2.5.  He started monthly octreotide injections but had profuse diarrhea with octreotide after 1st injection and after 2nd injection with decreased dose.  He is tolerating danazol without problems. 12/18 had more melena in setting of high INR => due to vigorous epistaxis versus AVM bleeding. ASA was stopped.   Currently no overt bleeding.  - He stay off ASA 81 and will continue INR goal 2-2.5 for now.   - Continue danazol.  8. L1 Burst Fracture: Stable currently.  Needs to wear brace when walking.  9. Diabetes: Cover with home NPH and sliding scale.   I reviewed the LVAD parameters from today, and compared the results to the patient's prior recorded data.  No programming changes were made.  The LVAD is functioning within specified parameters.  The  patient performs LVAD self-test daily.  LVAD interrogation was negative for any significant power changes, alarms or PI events/speed drops.  LVAD equipment check completed and is in good working order.  Back-up equipment present.   LVAD education done on emergency procedures and precautions and reviewed exit site care.  Length of Stay: 0  Loralie Champagne 06/06/2017, 11:52 PM  VAD Team Pager [phone] (7am - 7am) +++VAD ISSUES ONLY+++   Advanced Heart Failure Team Pager [phone] (M-F; [place])  Please contact [facility] for night-coverage after hours (4p -7a ) and weekends on [email or web address] for all non- LVAD Issues

## 2017-06-06 NOTE — ED Notes (Signed)
ED Provider at bedside. 

## 2017-06-06 NOTE — Patient Instructions (Signed)
Pre-Operative Instructions  Congratulations, you have decided to take an important step towards improving your quality of life.  You can be assured that the doctors and staff at [facility] will be with you every step of the way.  Here are some important things you should know:  1. Plan to be at the surgery center/hospital at least 1 (one) hour prior to your scheduled time, unless otherwise directed by the surgical center/hospital staff.  You must have a responsible adult accompany you, remain during the surgery and drive you home.  Make sure you have directions to the surgical center/hospital to ensure you arrive on time. 2. If you are having surgery at [facility] or [facility], you will need a copy of your medical history and physical form from your family physician within one month prior to the date of surgery. We will give you a form for your primary physician to complete.  3. We make every effort to accommodate the date you request for surgery.  However, there are times where surgery dates or times have to be moved.  We will contact you as soon as possible if a change in schedule is required.   4. No aspirin/ibuprofen for one week before surgery.  If you are on aspirin, any non-steroidal anti-inflammatory medications (Mobic, Aleve, Ibuprofen) should not be taken seven (7) days prior to your surgery.  You make take Tylenol for pain prior to surgery.  5. Medications - If you are taking daily heart and blood pressure medications, seizure, reflux, allergy, asthma, anxiety, pain or diabetes medications, make sure you notify the surgery center/hospital before the day of surgery so they can tell you which medications you should take or avoid the day of surgery. 6. No food or drink after midnight the night before surgery unless directed otherwise by surgical center/hospital staff. 7. No alcoholic beverages 24-hours prior to surgery.  No smoking 24-hours prior or 24-hours after  surgery. 8. Wear loose pants or shorts. They should be loose enough to fit over bandages, boots, and casts. 9. Don't wear slip-on shoes. Sneakers are preferred. 10. Bring your boot with you to the surgery center/hospital.  Also bring crutches or a walker if your physician has prescribed it for you.  If you do not have this equipment, it will be provided for you after surgery. 11. If you have not been contacted by the surgery center/hospital by the day before your surgery, call to confirm the date and time of your surgery. 12. Leave-time from work may vary depending on the type of surgery you have.  Appropriate arrangements should be made prior to surgery with your employer. 13. Prescriptions will be provided immediately following surgery by your doctor.  Fill these as soon as possible after surgery and take the medication as directed. Pain medications will not be refilled on weekends and must be approved by the doctor. 14. Remove nail polish on the operative foot and avoid getting pedicures prior to surgery. 15. Wash the night before surgery.  The night before surgery wash the foot and leg well with water and the antibacterial soap provided. Be sure to pay special attention to beneath the toenails and in between the toes.  Wash for at least three (3) minutes. Rinse thoroughly with water and dry well with a towel.  Perform this wash unless told not to do so by your physician.  Enclosed: 1 Ice pack (please put in freezer the night before surgery)   1 Hibiclens skin cleaner     Pre-op instructions  If you have any questions regarding the instructions, please do not hesitate to call our office.  [place]: [address], [place], [place] [address] -- [phone]  [place]: [address] [place], [place] [address] -- [phone]  [place]: [address]  [place], [place] [address] -- [phone]  [place]: [address], [address], [place],  [address] -- [phone]  Website:  [email or web address] 

## 2017-06-07 ENCOUNTER — Telehealth: Payer: Self-pay | Admitting: *Deleted

## 2017-06-07 ENCOUNTER — Inpatient Hospital Stay (HOSPITAL_COMMUNITY): Payer: BLUE CROSS/BLUE SHIELD

## 2017-06-07 ENCOUNTER — Other Ambulatory Visit: Payer: Self-pay

## 2017-06-07 ENCOUNTER — Other Ambulatory Visit (HOSPITAL_COMMUNITY): Payer: Self-pay

## 2017-06-07 ENCOUNTER — Encounter (HOSPITAL_COMMUNITY): Payer: Self-pay | Admitting: *Deleted

## 2017-06-07 DIAGNOSIS — M868X7 Other osteomyelitis, ankle and foot: Secondary | ICD-10-CM

## 2017-06-07 DIAGNOSIS — L97512 Non-pressure chronic ulcer of other part of right foot with fat layer exposed: Secondary | ICD-10-CM

## 2017-06-07 DIAGNOSIS — L0390 Cellulitis, unspecified: Secondary | ICD-10-CM

## 2017-06-07 DIAGNOSIS — E162 Hypoglycemia, unspecified: Secondary | ICD-10-CM

## 2017-06-07 DIAGNOSIS — M869 Osteomyelitis, unspecified: Secondary | ICD-10-CM

## 2017-06-07 DIAGNOSIS — I739 Peripheral vascular disease, unspecified: Secondary | ICD-10-CM

## 2017-06-07 DIAGNOSIS — E871 Hypo-osmolality and hyponatremia: Secondary | ICD-10-CM

## 2017-06-07 DIAGNOSIS — R7881 Bacteremia: Secondary | ICD-10-CM

## 2017-06-07 DIAGNOSIS — Z8249 Family history of ischemic heart disease and other diseases of the circulatory system: Secondary | ICD-10-CM

## 2017-06-07 DIAGNOSIS — F17210 Nicotine dependence, cigarettes, uncomplicated: Secondary | ICD-10-CM

## 2017-06-07 DIAGNOSIS — E0843 Diabetes mellitus due to underlying condition with diabetic autonomic (poly)neuropathy: Secondary | ICD-10-CM

## 2017-06-07 DIAGNOSIS — Z833 Family history of diabetes mellitus: Secondary | ICD-10-CM

## 2017-06-07 DIAGNOSIS — M86171 Other acute osteomyelitis, right ankle and foot: Secondary | ICD-10-CM

## 2017-06-07 DIAGNOSIS — I96 Gangrene, not elsewhere classified: Secondary | ICD-10-CM

## 2017-06-07 DIAGNOSIS — B9562 Methicillin resistant Staphylococcus aureus infection as the cause of diseases classified elsewhere: Secondary | ICD-10-CM

## 2017-06-07 DIAGNOSIS — R509 Fever, unspecified: Secondary | ICD-10-CM

## 2017-06-07 LAB — BASIC METABOLIC PANEL
ANION GAP: 9 (ref 5–15)
BUN: 15 mg/dL (ref 6–20)
CHLORIDE: 101 mmol/L (ref 101–111)
CO2: 23 mmol/L (ref 22–32)
Calcium: 8.1 mg/dL — ABNORMAL LOW (ref 8.9–10.3)
Creatinine, Ser: 1.46 mg/dL — ABNORMAL HIGH (ref 0.61–1.24)
GFR calc non Af Amer: 50 mL/min — ABNORMAL LOW (ref >60)
GFR, EST AFRICAN AMERICAN: 58 mL/min — AB (ref >60)
Glucose, Bld: 186 mg/dL — ABNORMAL HIGH (ref 65–99)
Potassium: 3.5 mmol/L (ref 3.5–5.1)
Sodium: 133 mmol/L — ABNORMAL LOW (ref 135–145)

## 2017-06-07 LAB — BLOOD CULTURE ID PANEL (REFLEXED)
Acinetobacter baumannii: NOT DETECTED
CANDIDA ALBICANS: NOT DETECTED
CANDIDA GLABRATA: NOT DETECTED
CANDIDA PARAPSILOSIS: NOT DETECTED
Candida krusei: NOT DETECTED
Candida tropicalis: NOT DETECTED
ENTEROBACTERIACEAE SPECIES: NOT DETECTED
Enterobacter cloacae complex: NOT DETECTED
Enterococcus species: NOT DETECTED
Escherichia coli: NOT DETECTED
HAEMOPHILUS INFLUENZAE: NOT DETECTED
KLEBSIELLA OXYTOCA: NOT DETECTED
KLEBSIELLA PNEUMONIAE: NOT DETECTED
Listeria monocytogenes: NOT DETECTED
METHICILLIN RESISTANCE: NOT DETECTED
Neisseria meningitidis: NOT DETECTED
PROTEUS SPECIES: NOT DETECTED
Pseudomonas aeruginosa: NOT DETECTED
STAPHYLOCOCCUS SPECIES: DETECTED — AB
STREPTOCOCCUS PYOGENES: NOT DETECTED
Serratia marcescens: NOT DETECTED
Staphylococcus aureus (BCID): DETECTED — AB
Streptococcus agalactiae: NOT DETECTED
Streptococcus pneumoniae: NOT DETECTED
Streptococcus species: NOT DETECTED

## 2017-06-07 LAB — URINALYSIS, ROUTINE W REFLEX MICROSCOPIC
BILIRUBIN URINE: NEGATIVE
GLUCOSE, UA: NEGATIVE mg/dL
KETONES UR: NEGATIVE mg/dL
NITRITE: NEGATIVE
PH: 5 (ref 5.0–8.0)
PROTEIN: NEGATIVE mg/dL
Specific Gravity, Urine: 1.008 (ref 1.005–1.030)

## 2017-06-07 LAB — GLUCOSE, CAPILLARY
GLUCOSE-CAPILLARY: 180 mg/dL — AB (ref 65–99)
GLUCOSE-CAPILLARY: 187 mg/dL — AB (ref 65–99)
GLUCOSE-CAPILLARY: 151 mg/dL — AB (ref 65–99)
Glucose-Capillary: 207 mg/dL — ABNORMAL HIGH (ref 65–99)

## 2017-06-07 LAB — SEDIMENTATION RATE: Sed Rate: 72 mm/hr — ABNORMAL HIGH (ref 0–16)

## 2017-06-07 LAB — CBC
HEMATOCRIT: 28.2 % — AB (ref 39.0–52.0)
HEMOGLOBIN: 9 g/dL — AB (ref 13.0–17.0)
MCH: 27.3 pg (ref 26.0–34.0)
MCHC: 31.9 g/dL (ref 30.0–36.0)
MCV: 85.5 fL (ref 78.0–100.0)
Platelets: 125 10*3/uL — ABNORMAL LOW (ref 150–400)
RBC: 3.3 MIL/uL — ABNORMAL LOW (ref 4.22–5.81)
RDW: 17.7 % — ABNORMAL HIGH (ref 11.5–15.5)
WBC: 9.1 10*3/uL (ref 4.0–10.5)

## 2017-06-07 LAB — LACTATE DEHYDROGENASE: LDH: 159 U/L (ref 98–192)

## 2017-06-07 LAB — PROTIME-INR
INR: 2.35
Prothrombin Time: 25.6 seconds — ABNORMAL HIGH (ref 11.4–15.2)

## 2017-06-07 LAB — MRSA PCR SCREENING: MRSA by PCR: NEGATIVE

## 2017-06-07 MED ORDER — SODIUM CHLORIDE 0.9% FLUSH: 10.0000 mL | INTRAVENOUS | Status: DC | PRN

## 2017-06-07 MED ORDER — POTASSIUM CHLORIDE CRYS ER 20 MEQ PO TBCR
40.0000 meq | EXTENDED_RELEASE_TABLET | Freq: Two times a day (BID) | ORAL | Status: DC
  Administered 2017-06-07 – 2017-06-10 (×6): 40 meq via ORAL
  Filled 2017-06-07 (×7): qty 2

## 2017-06-07 MED ORDER — SODIUM CHLORIDE 0.9% FLUSH
10.0000 mL | Freq: Two times a day (BID) | INTRAVENOUS | Status: DC
  Administered 2017-06-08 – 2017-06-11 (×8): 10 mL

## 2017-06-07 MED ORDER — INSULIN ASPART 100 UNIT/ML ~~LOC~~ SOLN
0.0000 [IU] | Freq: Three times a day (TID) | SUBCUTANEOUS | Status: DC
Start: 2017-06-07 — End: 2017-06-12
  Administered 2017-06-07: 5 [IU] via SUBCUTANEOUS
  Administered 2017-06-07 – 2017-06-08 (×3): 3 [IU] via SUBCUTANEOUS
  Administered 2017-06-08: 2 [IU] via SUBCUTANEOUS
  Administered 2017-06-09: 8 [IU] via SUBCUTANEOUS
  Administered 2017-06-09: 5 [IU] via SUBCUTANEOUS
  Administered 2017-06-09: 2 [IU] via SUBCUTANEOUS
  Administered 2017-06-10 (×2): 3 [IU] via SUBCUTANEOUS
  Administered 2017-06-11: 2 [IU] via SUBCUTANEOUS
  Administered 2017-06-11: 5 [IU] via SUBCUTANEOUS
  Administered 2017-06-11: 3 [IU] via SUBCUTANEOUS
  Administered 2017-06-12: 8 [IU] via SUBCUTANEOUS
  Administered 2017-06-12: 5 [IU] via SUBCUTANEOUS

## 2017-06-07 MED ORDER — FUROSEMIDE 10 MG/ML IJ SOLN: 40.0000 mg | Freq: Once | INTRAMUSCULAR | Status: DC

## 2017-06-07 MED ORDER — INSULIN ASPART 100 UNIT/ML ~~LOC~~ SOLN: 0.0000 [IU] | Freq: Every day | SUBCUTANEOUS | Status: DC

## 2017-06-07 MED ORDER — FUROSEMIDE 10 MG/ML IJ SOLN
40.0000 mg | Freq: Two times a day (BID) | INTRAMUSCULAR | Status: DC
  Administered 2017-06-07 – 2017-06-10 (×7): 40 mg via INTRAVENOUS
  Filled 2017-06-07 (×7): qty 4

## 2017-06-07 MED ORDER — PIPERACILLIN-TAZOBACTAM 3.375 G IVPB
3.3750 g | Freq: Three times a day (TID) | INTRAVENOUS | Status: DC
  Administered 2017-06-07 (×2): 3.375 g via INTRAVENOUS
  Filled 2017-06-07 (×2): qty 50

## 2017-06-07 MED ORDER — WARFARIN SODIUM 5 MG PO TABS: 5.0000 mg | ORAL_TABLET | Freq: Every day | ORAL | Status: DC

## 2017-06-07 MED ORDER — CHLORHEXIDINE GLUCONATE CLOTH 2 % EX PADS
6.0000 | MEDICATED_PAD | Freq: Every day | CUTANEOUS | Status: DC
  Administered 2017-06-07 – 2017-06-09 (×3): 6 via TOPICAL

## 2017-06-07 MED ORDER — WARFARIN - PHARMACIST DOSING INPATIENT: Freq: Every day | Status: DC

## 2017-06-07 MED ORDER — SODIUM CHLORIDE 0.9 % IV SOLN
INTRAVENOUS | Status: DC
  Administered 2017-06-07 – 2017-06-10 (×3): via INTRAVENOUS

## 2017-06-07 MED ORDER — ORAL CARE MOUTH RINSE
15.0000 mL | Freq: Two times a day (BID) | OROMUCOSAL | Status: DC
  Administered 2017-06-07 – 2017-06-12 (×5): 15 mL via OROMUCOSAL

## 2017-06-07 MED ORDER — POTASSIUM CHLORIDE CRYS ER 20 MEQ PO TBCR
40.0000 meq | EXTENDED_RELEASE_TABLET | Freq: Once | ORAL | Status: AC
  Administered 2017-06-07: 40 meq via ORAL
  Filled 2017-06-07: qty 2

## 2017-06-07 MED ORDER — CEFAZOLIN SODIUM-DEXTROSE 2-4 GM/100ML-% IV SOLN
2.0000 g | Freq: Three times a day (TID) | INTRAVENOUS | Status: DC
  Administered 2017-06-07 – 2017-06-12 (×14): 2 g via INTRAVENOUS
  Filled 2017-06-07 (×17): qty 100

## 2017-06-07 MED ORDER — VANCOMYCIN HCL 10 G IV SOLR: 1250.0000 mg | INTRAVENOUS | Status: DC

## 2017-06-07 MED ORDER — SERTRALINE HCL 25 MG PO TABS
25.0000 mg | ORAL_TABLET | Freq: Every day | ORAL | Status: DC
  Administered 2017-06-07 – 2017-06-12 (×5): 25 mg via ORAL
  Filled 2017-06-07 (×6): qty 1

## 2017-06-07 NOTE — Progress Notes (Signed)
ANTICOAGULATION CONSULT NOTE  Pharmacy Consult for Warfarin> heparin infusion Indication: LVAD  Allergies  Allergen Reactions  . Metformin And Related Nausea And Vomiting    *Only the extended release* (does this)  . Niacin And Related Other (See Comments)    REACTION IS SIDE EFFECT "SEVERE" FLUSHING   Patient Measurements: Height: 6\' 1"  (185.4 cm) Weight: 240 lb 4.8 oz (109 kg) IBW/kg (Calculated) : 79.9  Vital Signs: Temp: 98.4 F (36.9 C) (06/06 0400) Temp Source: Oral (06/06 0400) BP: 84/69 (06/06 0700) Pulse Rate: 91 (06/06 0700)  Labs: Recent Labs    06/06/17 2237 06/07/17 0430  HGB 9.6* 9.0*  HCT 29.6* 28.2*  PLT 139* 125*  LABPROT 25.6* 25.6*  INR 2.36 2.35  CREATININE 1.82* 1.46*    Estimated Creatinine Clearance: 67.9 mL/min (A) (by C-G formula based on SCr of 1.46 mg/dL (H)).   Medical History: Past Medical History:  Diagnosis Date  . AICD (automatic cardioverter/defibrillator) present   . Arthritis   . Barretts syndrome   . Carotid artery disease (HCC)    a. Dopp 09/2011: 60-79% RICA, 40-59% LICA.;  b.  Carotid US (7/14):  Bilateral 60-79% => f/u 6 mos  . Cerebrovascular disease   . Chronic systolic heart failure (HCC)   . Coronary artery disease    a. AWMI requiring IABP 2005 s/p Horizon study stent-LAD, staged BMS-Cx, DESx2-RCA. b. Last Last LHC (6/06):  EF 25%, pLAD stent 40-50, after stent 40, dLAD 20, pCFX 20, pOM1 70, pRCA 20, mRCA stents ok.=> med Rx;  c.  Myoview (7/14):  EF 26%, large ant, septal, inf and apical infarct, no ischemia.  Med Rx continued  . Diabetes mellitus   . Dyspnea    with exertion  . Headache   . Heart murmur   . History of kidney stones   . HTN (hypertension)   . Hyperlipidemia    mixed  . ICD (implantable cardiac defibrillator), dual, in situ    St. Jude for severe LVD EF 25% 2/07 explanted 2010. Medtronic Virtuoso II DR Dual-chamber cardiverter-defibrillator  with pocket revision, Dr. Graciela HusbandsKlein  . Ischemic  cardiomyopathy    a. Echo (09/27/12): EF 20%, diffuse HK with periapical AK, no LV thrombus noted, restrictive physiology, trivial MR, mild LAE, RVSF mildly reduced, PASP 39  . Pleural effusion 01/05/2016  . Presence of permanent cardiac pacemaker   . PVD (peripheral vascular disease) (HCC)    a. ABI 09/2011 - R normal, L moderate - saw Dr. Kirke CorinArida - med rx.   Marland Kitchen. RIATA ICD Lead--on advisory recall    . Tobacco abuse     Assessment: 62 y/o M presents to the ED with sepsis, hx of LVAD pt on warfarin PTA with lower goal due to hx GIB. INR on admission is 2.36 - down to 2.35 today.   Hgb 9, plt 125. Patient might require possible amputation in future - plan to hold warfarin and cover with heparin infusion when INR<1.8.  Goal of Therapy:  Heparin level: 0.3-0.5 (d/t hx of GIB) INR 2-2.5 (lower goal with hx GIB) Monitor platelets by anticoagulation protocol: Yes   Plan:  Hold warfarin tonight Monitor daily INR Start heparin once INR<1.8  Monitor for bleeding  Girard CooterKimberly Perkins, PharmD Clinical Pharmacist  Pager: [phone] Phone: [phone] 06/07/2017,7:33 AM

## 2017-06-07 NOTE — ED Notes (Signed)
Attempted to call report

## 2017-06-07 NOTE — Progress Notes (Addendum)
Advanced Heart Failure VAD Team Note  PCP-Cardiologist: No primary care provider on file.   Subjective:    Tmax 102.8. WBC 9.1 this am. Hgb 9.0. CVP 15.   Cr 1.82 -> 1.46. K 3.5.  Fatigued. Denies SOB. Remains in significant pain from his toe.   HVAD parameters RPM 2700 Hct 42% Peak 5.1 Trough 2.1 Suction On.  Objective:    Vital Signs:   Temp:  [97.8 F (36.6 C)-102.8 F (39.3 C)] 98.4 F (36.9 C) (06/06 0400) Pulse Rate:  [35-110] 91 (06/06 0700) Resp:  [13-34] 13 (06/06 0700) BP: (74-102)/(53-86) 84/69 (06/06 0700) SpO2:  [90 %-100 %] 97 % (06/06 0700) Weight:  [230 lb (104.3 kg)-240 lb 4.8 oz (109 kg)] 240 lb 4.8 oz (109 kg) (06/06 0425)   Mean arterial Pressure 84  Intake/Output:   Intake/Output Summary (Last 24 hours) at 06/07/2017 0722 Last data filed at 06/07/2017 0200 Gross per 24 hour  Intake 2750 ml  Output -  Net 2750 ml    Physical Exam    CVP 15  General:  Ill and fatigued appearing.  HEENT: Normal Neck: supple. JVP to jaw.  Carotids 2+ bilat; no bruits. No lymphadenopathy or thyromegaly appreciated. Cor: Mechanical heart sounds with LVAD hum present. Lungs: clear Abdomen: soft, nontender, nondistended. No hepatosplenomegaly. No bruits or masses. Good bowel sounds. Driveline: C/D/I; securement device intact and driveline incorporated Extremities: no cyanosis or clubbing. 1+ edema RLL to knee. R second toe with wound.  Neuro: alert & orientedx3, cranial nerves grossly intact. moves all 4 extremities w/o difficulty. Affect pleasant  Telemetry   NSR 80-90s, personally reviewed.   EKG    No new tracings.    Labs   Basic Metabolic Panel: Recent Labs  Lab 06/06/17 2237 06/07/17 0430  NA 129* 133*  K 4.2 3.5  CL 95* 101  CO2 22 23  GLUCOSE 202* 186*  BUN 15 15  CREATININE 1.82* 1.46*  CALCIUM 8.3* 8.1*    Liver Function Tests: Recent Labs  Lab 06/06/17 2237  AST 32  ALT 25  ALKPHOS 61  BILITOT 1.3*  PROT 7.3  ALBUMIN 2.9*    No results for input(s): LIPASE, AMYLASE in the last 168 hours. No results for input(s): AMMONIA in the last 168 hours.  CBC: Recent Labs  Lab 06/06/17 2237 06/07/17 0430  WBC 11.5* 9.1  NEUTROABS 10.6*  --   HGB 9.6* 9.0*  HCT 29.6* 28.2*  MCV 84.8 85.5  PLT 139* 125*   INR: Recent Labs  Lab 05/31/17 1005 06/06/17 2237 06/07/17 0430  INR 1.97 2.36 2.35   Other results:  EKG:   Imaging   Dg Chest Portable 1 View  Result Date: 06/07/2017 CLINICAL DATA:  Central line placement. EXAM: PORTABLE CHEST 1 VIEW COMPARISON:  Chest radiograph performed 06/06/2017 FINDINGS: The right IJ line is noted ending about the mid SVC. Mild bibasilar opacities may reflect atelectasis or mild interstitial edema. No pleural effusion or pneumothorax is seen. The cardiomediastinal silhouette is normal in size. The patient is status post median sternotomy. An AICD is noted at the left chest wall. A left ventricular assist device is partially imaged. No acute osseous abnormalities are seen. IMPRESSION: 1. Right IJ line noted ending about the mid SVC. No pneumothorax. 2. Mild bibasilar airspace opacities may reflect atelectasis or mild interstitial edema. Electronically Signed   By: Roanna RaiderJeffery  Chang M.D.   On: 06/07/2017 00:45   Dg Chest Port 1 View  Result Date: 06/06/2017 CLINICAL DATA:  Chills, weakness, and body aches. EXAM: PORTABLE CHEST 1 VIEW COMPARISON:  Chest x-ray dated April 11, 2017. FINDINGS: The bilateral costophrenic angles are not included in the field of view. Unchanged single lead left chest wall pacer device. Partially visualized LVAD. Stable cardiomediastinal silhouette. Normal pulmonary vascularity. Stable bibasilar scarring. No consolidation, pneumothorax, or large pleural effusion. No acute osseous abnormality. Old right-sided rib fractures again noted. IMPRESSION: No active disease. Electronically Signed   By: Obie Dredge M.D.   On: 06/06/2017 23:45   Dg Foot Complete  Right  Result Date: 06/06/2017 Please see detailed radiograph report in office note.     Medications:     Scheduled Medications: . atorvastatin  80 mg Oral q1800  . danazol  100 mg Oral BID  . ezetimibe  10 mg Oral Daily  . ferrous sulfate  325 mg Oral BID WC  . insulin aspart  0-15 Units Subcutaneous TID WC  . insulin aspart  0-5 Units Subcutaneous QHS  . insulin aspart protamine- aspart  12 Units Subcutaneous BID WC  . mouth rinse  15 mL Mouth Rinse BID  . metoCLOPramide  5 mg Oral TID AC  . pantoprazole  40 mg Oral BID  . sildenafil  20 mg Oral TID  . sodium chloride flush  3 mL Intravenous Q12H  . warfarin  5 mg Oral q1800  . Warfarin - Pharmacist Dosing Inpatient   Does not apply q1800     Infusions: . sodium chloride    . piperacillin-tazobactam (ZOSYN)  IV 3.375 g (06/07/17 0509)  . vancomycin       PRN Medications:  sodium chloride, acetaminophen **OR** acetaminophen, acetaminophen, ondansetron (ZOFRAN) IV, sodium chloride flush   Patient Profile   [name] is a 62 y.o. malesmoker with history of PAD, CAD, and ischemic cardiomyopathy s/p HVAD placement on 05/30/16.   Admitted 06/06/17 with septic shock with osteomyelitis in R great toe.   Assessment/Plan:    1. Suspected sepsis/septic shock  - Resolved, MAPs responded well to fluid bolus.  Lactate not elevated.   - Tmax 102.8. Suspect source is likely osteomyelitis in right second toe.   - CXR clear - BCx pending. Continue Broad spectrum abx => vancomycin/Zosyn.   - Ultimately will need right great toe amputation. VVS consulted and will discuss with podiatry as well.  2. Chronic systolic WUJ:[id number] cardiomyopathy - Echo in 3/18 with EF 20-25%. St Jude ICD.  - s/p HVAD placement. Lavare cycle is on - VAD interrogated personally. Parameters stable. No alarms.  - LDH stable.  - Hold losartan and spironolactone with soft BP.  - Torsemide held yesterday with early septic shock.  This has  resolved, now volume overloaded. Will attempt to gently diurese today with Lasix 40 mg IV bid.   - Hold warfarin for now for possible toe amputation and cover with heparin gtt when INR subtherapeutic.  3. CAD:  - 3 vessel CAD, s/p stent to proximal LAD and PTCA to subtotally occluded large OM1 in 1/18.  - Continue atorvastatin 80 mg daily.  - No change to current plan.   4. PAD: - Stable claudication, notes with moderate walking on left. Left fem-pop bypass occluded. Now with right 2nd toe ulceration and suspected osteomyelitis.  - Will need amputation.  Holding warfarin as above.  Spoke with vascular who recommend Consulting Duda vs patients podiatrist.  - No change to current plan.   5. Pulmonary arterial HTN:  - Continue Revatio 20 mg TID as pressure tolerated.  6. Smoking:  - Encouraged complete cessation.  7. Upper GI bleeding: 7/18, small bowel AVMs. ASA was decreased to 81 daily. Warfarin INR goal is now 2-2.5. He started monthly octreotide injections but had profuse diarrhea with octreotide after 1st injection and after 2nd injection with decreased dose. He is tolerating danazol without problems. 12/18 had more melena in setting of high INR =>due to vigorous epistaxis versus AVM bleeding. ASA was stopped.  - No overt bleeding currently.  - He stay off ASA 81 and will continue INR goal 2-2.5 for now. - Continue danazol.  8. L1 Burst Fracture:  - Stable. Once ambulatory needs to wear brace when walking.  9. Diabetes:  - Cover with home NPH and sliding scale.   Vascular consulted. Will talk to Dr. Logan Bores office since he knows patient and plan how to proceed with amputation.   I reviewed the HVAD parameters from today, and compared the results to the patient's prior recorded data.  No programming changes were made.  The LVAD is functioning within specified parameters.  The patient performs HVAD self-test daily.  HVAD interrogation was negative for any significant power changes,  alarms or PI events/speed drops.  HVAD equipment check completed and is in good working order.  Back-up equipment present.   HVAD education done on emergency procedures and precautions and reviewed exit site care.  Length of Stay: 1  Luane School 06/07/2017, 7:22 AM  VAD Team --- VAD ISSUES ONLY--- Pager [phone] (7am - 7am)  Advanced Heart Failure Team  Pager [phone] (M-F; 7a - 4p)  Please contact [facility] for night-coverage after hours (4p -7a ) and weekends on [email or web address]  Patient seen with PA, agree with the above note.  Admitted yesterday with early septic shock that responded to IV fluid and antibiotics.  MAP in 70s-80s this morning.  Main complaint is back pain, some dyspnea with CVP up to 15 from IVF yesterday.  CXR yesterday was clear.  Blood cultures still pending.   On exam, normal HVAD sounds.  JVP 13-14 cm.  1+ edema to knee on right, right 2nd toe wrapped. Clear lungs.   He is afebrile this morning, continue vancomycin/Zosyn.  I suspect source of septic shock is osteomyelitis of right great toe.  Will have vascular evaluate and will involve his podiatrist.  Will need peripheral arterial dopplers.  He is going to need amputation of toe as soon as feasible to remove source of infection.   MAP stable this morning in 70s-80s.  CVP 15-17 after IV fluid yesterday, mild dyspnea.  - Continue to hold losartan, spironolactone.  - Will give Lasix 40 mg IV bid for now.   Warfarin on hold for eventual surgery. Cover with heparin gtt when INR < 1.8.  LDH stable.   Marca Ancona 06/07/2017 9:09 AM

## 2017-06-07 NOTE — Progress Notes (Signed)
Preliminary results by tech - Right Lower Ext. Arterial Duplex Completed. The graft is patent with no evidence of narrowing/stenosis noted with 2D images and color flow Doppler. The posterior tibial and peroneal arteries both demonstrated diminutive flow throughout. The anterior tibial artery appears patent throughout. Results given to [facility]. Marilynne Halstedita Leaann Nevils, BS, RDMS, RVT

## 2017-06-07 NOTE — Progress Notes (Signed)
Pharmacy Antibiotic Note  Lynda RainwaterLarry D Scheller is a 62 y.o. male admitted on 06/06/2017 with sepsis.  Pharmacy has been consulted for Vancomycin/Zosyn dosing. LVAD pt from home with chills/body aches/fever. Tmax 102.8. WBC 11.5. Bump in Scr up to 1.82.   Plan: -Vancomycin 2000 mg IV x 1, then given 1250 mg IV q24h, increase dose if renal function improves -Zosyn 3.375G IV q8h to be infused over 4 hours -Trend WBC, temp, renal function  -F/U infectious work-up -Drug levels as indicated   Height: 6\' 1"  (185.4 cm) Weight: 230 lb (104.3 kg) IBW/kg (Calculated) : 79.9  Temp (24hrs), Avg:102.8 F (39.3 C), Min:102.8 F (39.3 C), Max:102.8 F (39.3 C)  Recent Labs  Lab 06/06/17 2237  WBC 11.5*  CREATININE 1.82*  LATICACIDVEN 1.59    Estimated Creatinine Clearance: 53.4 mL/min (A) (by C-G formula based on SCr of 1.82 mg/dL (H)).    Allergies  Allergen Reactions  . Metformin And Related Nausea And Vomiting    *Only the extended release* (does this)  . Niacin And Related Other (See Comments)    REACTION IS SIDE EFFECT "SEVERE" Thompson CaulFLUSHING     Lamonda Noxon 06/07/2017 12:06 AM

## 2017-06-07 NOTE — Progress Notes (Signed)
ABI's have been completed. Right Non-compressible Left Non-compressible The dorsalis pedis and posterior tibial waveforms are unreliable due to the patient being on an LVAD. Unable to obtain TBI's due to low amplitude waveforms.  06/07/17 12:32 PM Olen CordialGreg Minerva Bluett RVT

## 2017-06-07 NOTE — Progress Notes (Signed)
ANTICOAGULATION CONSULT NOTE - Initial Consult  Pharmacy Consult for Warfarin  Indication: HVAD  Allergies  Allergen Reactions  . Metformin And Related Nausea And Vomiting    *Only the extended release* (does this)  . Niacin And Related Other (See Comments)    REACTION IS SIDE EFFECT "SEVERE" FLUSHING   Patient Measurements: Height: 6\' 1"  (185.4 cm) Weight: 230 lb (104.3 kg) IBW/kg (Calculated) : 79.9  Vital Signs: Temp: 97.8 F (36.6 C) (06/06 0051) Temp Source: Oral (06/06 0051) BP: 90/72 (06/05 2257) Pulse Rate: 93 (06/06 0000)  Labs: Recent Labs    06/06/17 2237  HGB 9.6*  HCT 29.6*  PLT 139*  LABPROT 25.6*  INR 2.36  CREATININE 1.82*    Estimated Creatinine Clearance: 53.4 mL/min (A) (by C-G formula based on SCr of 1.82 mg/dL (H)).   Medical History: Past Medical History:  Diagnosis Date  . AICD (automatic cardioverter/defibrillator) present   . Arthritis   . Barretts syndrome   . Carotid artery disease (HCC)    a. Dopp 09/2011: 60-79% RICA, 40-59% LICA.;  b.  Carotid US (7/14):  Bilateral 60-79% => f/u 6 mos  . Cerebrovascular disease   . Chronic systolic heart failure (HCC)   . Coronary artery disease    a. AWMI requiring IABP 2005 s/p Horizon study stent-LAD, staged BMS-Cx, DESx2-RCA. b. Last Last LHC (6/06):  EF 25%, pLAD stent 40-50, after stent 40, dLAD 20, pCFX 20, pOM1 70, pRCA 20, mRCA stents ok.=> med Rx;  c.  Myoview (7/14):  EF 26%, large ant, septal, inf and apical infarct, no ischemia.  Med Rx continued  . Diabetes mellitus   . Dyspnea    with exertion  . Headache   . Heart murmur   . History of kidney stones   . HTN (hypertension)   . Hyperlipidemia    mixed  . ICD (implantable cardiac defibrillator), dual, in situ    St. Jude for severe LVD EF 25% 2/07 explanted 2010. Medtronic Virtuoso II DR Dual-chamber cardiverter-defibrillator  with pocket revision, Dr. Graciela HusbandsKlein  . Ischemic cardiomyopathy    a. Echo (09/27/12): EF 20%, diffuse HK  with periapical AK, no LV thrombus noted, restrictive physiology, trivial MR, mild LAE, RVSF mildly reduced, PASP 39  . Pleural effusion 01/05/2016  . Presence of permanent cardiac pacemaker   . PVD (peripheral vascular disease) (HCC)    a. ABI 09/2011 - R normal, L moderate - saw Dr. Kirke CorinArida - med rx.   Marland Kitchen. RIATA ICD Lead--on advisory recall    . Tobacco abuse     Assessment: 62 y/o M presents to the ED with sepsis, HVAD pt on warfarin PTA with lower goal due to hx GIB, Hgb 9.6, Plts 139, INR is within therapeutic range at 2.36  Goal of Therapy:  INR 2-2.5 (lower goal with hx GIB) Monitor platelets by anticoagulation protocol: Yes   Plan:  Warfarin 5 mg daily (home regimen) Daily PT/INR, adjust dose as needed Monitor for bleeding  Abran DukeLedford, Harmon Bommarito 06/07/2017,2:07 AM

## 2017-06-07 NOTE — Plan of Care (Signed)
  Problem: Nutrition: Goal: Adequate nutrition will be maintained Outcome: Progressing   Problem: Coping: Goal: Level of anxiety will decrease Outcome: Progressing   Problem: Pain Managment: Goal: General experience of comfort will improve Outcome: Progressing   

## 2017-06-07 NOTE — Progress Notes (Signed)
PHARMACY - PHYSICIAN COMMUNICATION CRITICAL VALUE ALERT - BLOOD CULTURE IDENTIFICATION (BCID)  Dalton RainwaterLarry D [name] is an 62 y.o. male [facility] with a chief complaint of weakness, nausea, and diarrhea.   Assessment: Suspected osteomyelitis in R great toe. WBC 11.5>9.1. Sed rate 72. LA is 1.59. Tmax 102.8 - now improving. Was started on vancomycin and zosyn therapy. BCID identified MSSA in 4/4 bottles. Scr was 1.82, improving to 1.46 today (normCrCl ~55 mL/min).  Name of physician (or Provider) Contacted: Dr Shirlee LatchMcLean  Current antibiotics: Vancomycin and zosyn  Changes to prescribed antibiotics recommended:  Will change to ancef 2 g IV every 8 hours   Results for orders placed or performed during the hospital encounter of 06/06/17  Blood Culture ID Panel (Reflexed) (Collected: 06/06/2017 10:21 PM)  Result Value Ref Range   Enterococcus species NOT DETECTED NOT DETECTED   Listeria monocytogenes NOT DETECTED NOT DETECTED   Staphylococcus species DETECTED (A) NOT DETECTED   Staphylococcus aureus DETECTED (A) NOT DETECTED   Methicillin resistance NOT DETECTED NOT DETECTED   Streptococcus species NOT DETECTED NOT DETECTED   Streptococcus agalactiae NOT DETECTED NOT DETECTED   Streptococcus pneumoniae NOT DETECTED NOT DETECTED   Streptococcus pyogenes NOT DETECTED NOT DETECTED   Acinetobacter baumannii NOT DETECTED NOT DETECTED   Enterobacteriaceae species NOT DETECTED NOT DETECTED   Enterobacter cloacae complex NOT DETECTED NOT DETECTED   Escherichia coli NOT DETECTED NOT DETECTED   Klebsiella oxytoca NOT DETECTED NOT DETECTED   Klebsiella pneumoniae NOT DETECTED NOT DETECTED   Proteus species NOT DETECTED NOT DETECTED   Serratia marcescens NOT DETECTED NOT DETECTED   Haemophilus influenzae NOT DETECTED NOT DETECTED   Neisseria meningitidis NOT DETECTED NOT DETECTED   Pseudomonas aeruginosa NOT DETECTED NOT DETECTED   Candida albicans NOT DETECTED NOT DETECTED   Candida glabrata NOT DETECTED NOT DETECTED   Candida krusei NOT DETECTED NOT DETECTED   Candida parapsilosis NOT DETECTED NOT DETECTED   Candida tropicalis NOT DETECTED NOT DETECTED    Girard CooterKimberly Perkins, PharmD Clinical Pharmacist  Pager: [phone] Phone: [phone] 06/07/2017  1:50 PM

## 2017-06-07 NOTE — Consult Note (Signed)
[facility]    Date of Admission:  06/06/2017              Reason for Consult: Osteomyelitis / MSSA Bacteremia   Referring Provider: [employer] Primary Care Provider: Sigmund Hazel, MD   Assessment/Plan:  Mr. Pfannenstiel is a 62 y/o male with previous medical history of CHF now with LVAD, PVD, and diabetes being treated by podiatry for osteomyelitis of the right second toe with plan for scheduled amputation. Presented to the ED with fevers and weakness with blood cultures positive for MSSA bacteremia likely assoicated with the toe osteomyelitis. He did have a central line inserted in the ED which may need to be removed. He also has an LVAD and ICD present which may need evaluation  1. Agree with pharmacy changing antibiotics to Ancef.  2. Repeat blood cultures.  3. Recommend TEE to investigate possibility of endocarditis and evaluate ICD to ensure they have not become infected as duration of bacteremia is unknown.  4. Surgical interventions for the right second toe per Dr. Logan Bores. 5. Recommend removal of central line if not emergent to reduce risk of contamination.        [facility] Staphylococcus aureus bacteremia   Staphylococcus aureus bacteremia (SAB) is associated with a high rate of complications and mortality.  Specific aspects of clinical management are critical to optimizing the outcome of patients with SAB.  Therefore, the [facility]imicrobial Management Team [facility]) has initiated an intervention aimed at improving the management of SAB at [facility].  To do so, Infectious Diseases physicians are providing an evidence-based consult for the management of all patients with SAB.     Yes No Comments  Perform follow-up blood cultures (even if the patient is afebrile) to ensure clearance of bacteremia [x]  []  Cultures ordered for 24 hours post start of antibiotics.   Remove vascular catheter and obtain follow-up blood  cultures after the removal of the catheter [x]  []  Central line inserted in the ED. Recommend removal  Perform echocardiography to evaluate for endocarditis (transthoracic ECHO is 40-50% sensitive, TEE is > 90% sensitive) [x]  []  LVAD and ICD present - recommend TEE to investigate   Consult electrophysiologist to evaluate implanted cardiac device (pacemaker, ICD) []  [x]    Ensure source control [x]  []  Likely R toe osteomyelitis   Investigate for "metastatic" sites of infection [x]  []  Does the patient have ANY symptom or physical exam finding that would suggest a deeper infection (back or neck pain that may be suggestive of vertebral osteomyelitis or epidural abscess, muscle pain that could be a symptom of pyomyositis)?  Keep in mind that for deep seeded infections MRI imaging with contrast is preferred rather than other often insensitive tests such as plain x-rays, especially early in a patient's presentation.  Change antibiotic therapy to __________________ [x]  []  Antibiotic changed to Ancef  Estimated duration of IV antibiotic therapy:   [x]  []  Pending - decision for amputation of the second toe.    Active Problems:   Septic shock (HCC)   . atorvastatin  80 mg Oral q1800  . Chlorhexidine Gluconate Cloth  6 each Topical Daily  . danazol  100 mg Oral BID  . ezetimibe  10 mg Oral Daily  . ferrous sulfate  325 mg Oral BID WC  . furosemide  40 mg Intravenous BID  . insulin aspart  0-15 Units Subcutaneous TID WC  . insulin aspart  0-5 Units Subcutaneous QHS  . insulin  aspart protamine- aspart  12 Units Subcutaneous BID WC  . mouth rinse  15 mL Mouth Rinse BID  . metoCLOPramide  5 mg Oral TID AC  . pantoprazole  40 mg Oral BID  . potassium chloride  40 mEq Oral BID  . sertraline  25 mg Oral Daily  . sildenafil  20 mg Oral TID  . sodium chloride flush  10-40 mL Intracatheter Q12H  . sodium chloride flush  3 mL Intravenous Q12H     HPI: ZAYVON ALICEA is a 62 y.o. male previous medical history  of CAD, heart failure now with LVAD, peripheral vascular disease, ICD placement, hypertension, and hyperlipidemia presenting to the ED and admitted to the hospital with the chief complaint of fever (max 102) and weakness going on for about 2 days. Also noted on physical exam to have an ulceration on the right second toe with purulent, foul-smelling drainage.    Lab work was remarkable for hyponatremia of 129, hyperglycemia of 202, creatinine of 1.8, GFR of 44, WBC of 11.5, Anemia with 9.6, and thrombocytopenia of 139. A central line was placed in the ED and he was started on Vancomycin and Zosyn following blood cultures. Chest x-ray with without evidence of pneumonia. Right toe x-rays consistent with osteomyelitis.   Mr. Ohlin was seen by Dr. Logan Bores on 6/5 with the recommendation for second toe amputation due to the underlying osteomyelitis.  Febrile last evening with a max temp of 102.8 with no leukocytosis. Blood cultures positive for MSSA with pharmacy changing vancomycin and piperacillin-tazobactam to cefazolin. Vascular Surgery has evaluated and okay to proceed with right toe amputation. Believes that the ulcer located on his right 2nd toe has been going on for several weeks and just started to pay attention to it in the past couple of weeks. States his blood sugars are generally well controlled. Denies any purulence or signs of infection from the drive line.    Review of Systems: Review of Systems  Constitutional: Positive for chills and fever. Negative for diaphoresis and weight loss.  Respiratory: Positive for cough. Negative for shortness of breath and wheezing.   Skin: Negative for rash.  Neurological: Positive for weakness. Negative for dizziness.     Past Medical History:  Diagnosis Date  . AICD (automatic cardioverter/defibrillator) present   . Arthritis   . Barretts syndrome   . Carotid artery disease (HCC)    a. Dopp 09/2011: 60-79% RICA, 40-59% LICA.;  b.  Carotid US (7/14):   Bilateral 60-79% => f/u 6 mos  . Cerebrovascular disease   . Chronic systolic heart failure (HCC)   . Coronary artery disease    a. AWMI requiring IABP 2005 s/p Horizon study stent-LAD, staged BMS-Cx, DESx2-RCA. b. Last Last LHC (6/06):  EF 25%, pLAD stent 40-50, after stent 40, dLAD 20, pCFX 20, pOM1 70, pRCA 20, mRCA stents ok.=> med Rx;  c.  Myoview (7/14):  EF 26%, large ant, septal, inf and apical infarct, no ischemia.  Med Rx continued  . Diabetes mellitus   . Dyspnea    with exertion  . Headache   . Heart murmur   . History of kidney stones   . HTN (hypertension)   . Hyperlipidemia    mixed  . ICD (implantable cardiac defibrillator), dual, in situ    St. Jude for severe LVD EF 25% 2/07 explanted 2010. Medtronic Virtuoso II DR Dual-chamber cardiverter-defibrillator  with pocket revision, Dr. Graciela Husbands  . Ischemic cardiomyopathy    a. Echo (09/27/12): EF  20%, diffuse HK with periapical AK, no LV thrombus noted, restrictive physiology, trivial MR, mild LAE, RVSF mildly reduced, PASP 39  . Pleural effusion 01/05/2016  . Presence of permanent cardiac pacemaker   . PVD (peripheral vascular disease) (HCC)    a. ABI 09/2011 - R normal, L moderate - saw Dr. Kirke CorinArida - med rx.   Marland Kitchen. RIATA ICD Lead--on advisory recall    . Tobacco abuse     Social History   Tobacco Use  . Smoking status: Current Every Day Smoker    Packs/day: 0.50    Years: 45.00    Pack years: 22.50    Types: Cigarettes  . Smokeless tobacco: Never Used  . Tobacco comment: pt reports he is stress smoker  Substance Use Topics  . Alcohol use: No    Alcohol/week: 0.0 oz  . Drug use: No    Comment: former Cocain, Acid, Marijuana - 25 years ago    Family History  Problem Relation Age of Onset  . Diabetes Mother   . Coronary artery disease Father   . Heart disease Father        before age 63    Allergies  Allergen Reactions  . Metformin And Related Nausea And Vomiting    *Only the extended release* (does this)  .  Niacin And Related Other (See Comments)    REACTION IS SIDE EFFECT "SEVERE" FLUSHING    OBJECTIVE: Blood pressure (!) 85/74, pulse 97, temperature 99.7 F (37.6 C), temperature source Oral, resp. rate (!) 22, height 6\' 1"  (1.854 m), weight 240 lb 4.8 oz (109 kg), SpO2 98 %.  Physical Exam  Constitutional: He is oriented to person, place, and time. He appears well-developed and well-nourished. No distress.  Cardiovascular:  LVAD device noted on auscultation.   Pulmonary/Chest: Effort normal and breath sounds normal. No stridor. No respiratory distress. He has no wheezes. He has no rales. He exhibits no tenderness.  Abdominal: Soft. Bowel sounds are normal.  Neurological: He is alert and oriented to person, place, and time.  Skin: Skin is warm and dry. No rash noted.  Moistened skin underneath band-aid with deep red ulceratted area located on the medial aspect of the right second toe.   Psychiatric: He has a normal mood and affect. His behavior is normal. Judgment and thought content normal.        Lab Results Lab Results  Component Value Date   WBC 9.1 06/07/2017   HGB 9.0 (L) 06/07/2017   HCT 28.2 (L) 06/07/2017   MCV 85.5 06/07/2017   PLT 125 (L) 06/07/2017    Lab Results  Component Value Date   CREATININE 1.46 (H) 06/07/2017   BUN 15 06/07/2017   NA 133 (L) 06/07/2017   K 3.5 06/07/2017   CL 101 06/07/2017   CO2 23 06/07/2017    Lab Results  Component Value Date   ALT 25 06/06/2017   AST 32 06/06/2017   ALKPHOS 61 06/06/2017   BILITOT 1.3 (H) 06/06/2017     Microbiology: Recent Results (from the past 240 hour(s))  Culture, blood (Routine x 2)     Status: None (Preliminary result)   Collection Time: 06/06/17 10:21 PM  Result Value Ref Range Status   Specimen Description BLOOD RIGHT ANTECUBITAL  Final   Special Requests   Final    BOTTLES DRAWN AEROBIC AND ANAEROBIC Blood Culture adequate volume   Culture  Setup Time   Final    GRAM POSITIVE COCCI IN BOTH  AEROBIC AND  ANAEROBIC BOTTLES Organism ID to follow CRITICAL RESULT CALLED TO, READ BACK BY AND VERIFIED WITH: PHARMD E MARTIN 06/07/17 AT 1312 BY CM Performed at [facility], [address], [place], [place] [address]    Culture GRAM POSITIVE COCCI  Final   Report Status PENDING  Incomplete  Blood Culture ID Panel (Reflexed)     Status: Abnormal   Collection Time: 06/06/17 10:21 PM  Result Value Ref Range Status   Enterococcus species NOT DETECTED NOT DETECTED Final   Listeria monocytogenes NOT DETECTED NOT DETECTED Final   Staphylococcus species DETECTED (A) NOT DETECTED Final    Comment: CRITICAL RESULT CALLED TO, READ BACK BY AND VERIFIED WITH: PHARMD E MARTIN 06/07/17 AT 1312 BY CM    Staphylococcus aureus DETECTED (A) NOT DETECTED Final    Comment: Methicillin (oxacillin) susceptible Staphylococcus aureus (MSSA). Preferred therapy is anti staphylococcal beta lactam antibiotic (Cefazolin or Nafcillin), unless clinically contraindicated. CRITICAL RESULT CALLED TO, READ BACK BY AND VERIFIED WITH: PHARMD E MARTIN 06/07/17 AT 1312 BY CM    Methicillin resistance NOT DETECTED NOT DETECTED Final   Streptococcus species NOT DETECTED NOT DETECTED Final   Streptococcus agalactiae NOT DETECTED NOT DETECTED Final   Streptococcus pneumoniae NOT DETECTED NOT DETECTED Final   Streptococcus pyogenes NOT DETECTED NOT DETECTED Final   Acinetobacter baumannii NOT DETECTED NOT DETECTED Final   Enterobacteriaceae species NOT DETECTED NOT DETECTED Final   Enterobacter cloacae complex NOT DETECTED NOT DETECTED Final   Escherichia coli NOT DETECTED NOT DETECTED Final   Klebsiella oxytoca NOT DETECTED NOT DETECTED Final   Klebsiella pneumoniae NOT DETECTED NOT DETECTED Final   Proteus species NOT DETECTED NOT DETECTED Final   Serratia marcescens NOT DETECTED NOT DETECTED Final   Haemophilus influenzae NOT DETECTED NOT DETECTED Final   Neisseria meningitidis NOT DETECTED NOT DETECTED Final    Pseudomonas aeruginosa NOT DETECTED NOT DETECTED Final   Candida albicans NOT DETECTED NOT DETECTED Final   Candida glabrata NOT DETECTED NOT DETECTED Final   Candida krusei NOT DETECTED NOT DETECTED Final   Candida parapsilosis NOT DETECTED NOT DETECTED Final   Candida tropicalis NOT DETECTED NOT DETECTED Final    Comment: Performed at [facility], [address] [place], [place] [address]  Culture, blood (Routine x 2)     Status: None (Preliminary result)   Collection Time: 06/06/17 10:29 PM  Result Value Ref Range Status   Specimen Description BLOOD RIGHT FOREARM  Final   Special Requests   Final    BOTTLES DRAWN AEROBIC AND ANAEROBIC Blood Culture adequate volume   Culture  Setup Time   Final    GRAM POSITIVE COCCI IN BOTH AEROBIC AND ANAEROBIC BOTTLES CRITICAL RESULT CALLED TO, READ BACK BY AND VERIFIED WITH: PHARMD E MARTIN 06/07/17 AT 1312 BY CM Performed at [facility], [address], [place], [place] [address]    Culture GRAM POSITIVE COCCI  Final   Report Status PENDING  Incomplete  MRSA PCR Screening     Status: None   Collection Time: 06/07/17  1:43 AM  Result Value Ref Range Status   MRSA by PCR NEGATIVE NEGATIVE Final    Comment:        The GeneXpert MRSA Assay (FDA approved for NASAL specimens only), is one component of a comprehensive MRSA colonization surveillance program. It is not intended to diagnose MRSA infection nor to guide or monitor treatment for MRSA infections. Performed at [facility], [address] [place], [place] [address]      Marcos Eke, NP [facility] [phone] Pager  06/07/2017  1:46 PM

## 2017-06-07 NOTE — Progress Notes (Signed)
[name] did not bring his black bag with his back up batteries and backup controller. Currently only has two of his own batteries with him. We have the HVAD cart at bedside that has 2 extra batteries and an emergency backup controller. Will call Mr. Rice Sr. In the morning and request that he bring in his emergency equipment bag. I called downstairs to the ER to make sure that EMS had not grabbed his bag and brought it in without Mr. Arth realizing they had it. ER RN stated that they had not seen an extra black bag. Thresa RossA. Remmy Riffe RN

## 2017-06-07 NOTE — ED Notes (Signed)
Theodoro Gristave, Rapid Response RN called to assist ED RN in transport to [facility]

## 2017-06-07 NOTE — Plan of Care (Signed)
Patient has nosebleed, states he gets them frequently. Will continue to assess.

## 2017-06-07 NOTE — Consult Note (Signed)
WOC Nurse wound consult note Reason for Consult: Right second toe wound Wound type: Unclear etiology POA: Yes Measurement: 0.7 cm x 1.0 cm  Wound bed: 100% hypergranulation tissue Drainage (amount, consistency, odor) Heavy amount on existing bandage when assessed in room 2H13 Periwound: Intact, normal color skin Dressing procedure/placement/frequency: Wash with soap and water, pat dry. Apply bandaid. Change daily and prn.   Patient and primary RN state he is supposed to have an amputation to the toe for osteomyelitis in the near future. Monitor the wound area(s) for worsening of condition such as: Signs/symptoms of infection,  Increase in size,  Development of or worsening of odor, Development of pain, or increased pain at the affected locations.  Notify the medical team if any of these develop.  Thank you for the consult.  Discussed plan of care with the patient and bedside nurse.  WOC nurse will not follow at this time.  Please re-consult the WOC team if needed.  Helmut MusterSherry Vu Liebman, RN, MSN, CWOCN, CNS-BC, pager [phone]

## 2017-06-07 NOTE — Consult Note (Addendum)
Reason for consultation: osteomyelitis R 2nd toe    History of Present Illness   [name] is a 62 y.o. ([birth year]) male with past medical history significant for CAD, CHF with left ventricular assist device, insulin-dependent diabetes mellitus, hyperlipidemia, and HTN.  He is seen in consultation for suspected osteomyelitis of right second toe by radiographic evidence.  He is well-known to vascular surgery service with surgical history significant for left femoral to below the knee popliteal artery bypass with PTFE which is known to be occluded.  Patient has also had right femoral-popliteal bypass in 2015 with vein.  As of September 2018 when he was last seen in office right lower externally bypass was patent with monophasic flow throughout.  Based on chart review the plan is for right second toe amputation by the patient's podiatrist Dr. Logan Bores.  Patient denies any rest pain in his right foot.  He does endorse a claudication story of left lower extremity with symptoms mainly in his left calf.  He is on Coumadin which is being converted to IV heparin.  INR today is 2.35.  He is also taking a statin daily.  He is a current everyday smoker.  Past Medical History:  Diagnosis Date  . AICD (automatic cardioverter/defibrillator) present   . Arthritis   . Barretts syndrome   . Carotid artery disease (HCC)    a. Dopp 09/2011: 60-79% RICA, 40-59% LICA.;  b.  Carotid US (7/14):  Bilateral 60-79% => f/u 6 mos  . Cerebrovascular disease   . Chronic systolic heart failure (HCC)   . Coronary artery disease    a. AWMI requiring IABP 2005 s/p Horizon study stent-LAD, staged BMS-Cx, DESx2-RCA. b. Last Last LHC (6/06):  EF 25%, pLAD stent 40-50, after stent 40, dLAD 20, pCFX 20, pOM1 70, pRCA 20, mRCA stents ok.=> med Rx;  c.  Myoview (7/14):  EF 26%, large ant, septal, inf and apical infarct, no ischemia.  Med Rx continued  . Diabetes mellitus   . Dyspnea    with exertion  . Headache   . Heart murmur     . History of kidney stones   . HTN (hypertension)   . Hyperlipidemia    mixed  . ICD (implantable cardiac defibrillator), dual, in situ    St. Jude for severe LVD EF 25% 2/07 explanted 2010. Medtronic Virtuoso II DR Dual-chamber cardiverter-defibrillator  with pocket revision, Dr. Graciela Husbands  . Ischemic cardiomyopathy    a. Echo (09/27/12): EF 20%, diffuse HK with periapical AK, no LV thrombus noted, restrictive physiology, trivial MR, mild LAE, RVSF mildly reduced, PASP 39  . Pleural effusion 01/05/2016  . Presence of permanent cardiac pacemaker   . PVD (peripheral vascular disease) (HCC)    a. ABI 09/2011 - R normal, L moderate - saw Dr. Kirke Corin - med rx.   Marland Kitchen RIATA ICD Lead--on advisory recall    . Tobacco abuse     Past Surgical History:  Procedure Laterality Date  . ABDOMINAL AORTAGRAM N/A 11/06/2013   Procedure: ABDOMINAL Ronny Flurry;  Surgeon: Chuck Hint, MD;  Location: [facility] CATH LAB;  Service: Cardiovascular;  Laterality: N/A;  . APPENDECTOMY  2000   ruptured  . CARDIAC CATHETERIZATION N/A 01/07/2016   Procedure: Right/Left Heart Cath and Coronary Angiography;  Surgeon: Laurey Morale, MD;  Location: [facility] INVASIVE CV LAB;  Service: Cardiovascular;  Laterality: N/A;  . CARDIAC CATHETERIZATION N/A 01/28/2016   Procedure: Coronary Stent Intervention;  Surgeon: Tonny Bollman, MD;  Location: [facility] INVASIVE  CV LAB;  Service: Cardiovascular;  Laterality: N/A;  . CARDIAC CATHETERIZATION N/A 01/28/2016   Procedure: Right Heart Cath;  Surgeon: Tonny Bollman, MD;  Location: [facility] INVASIVE CV LAB;  Service: Cardiovascular;  Laterality: N/A;  . CARDIAC CATHETERIZATION N/A 01/28/2016   Procedure: Intravascular Ultrasound/IVUS;  Surgeon: Tonny Bollman, MD;  Location: [facility] INVASIVE CV LAB;  Service: Cardiovascular;  Laterality: N/A;  . CHEST TUBE INSERTION Right 04/07/2016   Procedure: INSERTION PLEURAL DRAINAGE CATHETER;  Surgeon: Kerin Perna, MD;  Location: [facility] OR;  Service: Thoracic;  Laterality:  Right;  . ENTEROSCOPY N/A 07/10/2016   Procedure: ENTEROSCOPY;  Surgeon: Carman Ching, MD;  Location: [facility] ENDOSCOPY;  Service: Endoscopy;  Laterality: N/A;  . FEMORAL-POPLITEAL BYPASS GRAFT Right 11/07/2013   Procedure: Right FEMORAL-POPLITEAL ARTERY Bypass ;  Surgeon: Chuck Hint, MD;  Location: [facility] OR;  Service: Vascular;  Laterality: Right;  . FEMORAL-POPLITEAL BYPASS GRAFT Left 11/17/2014   Procedure: BYPASS GRAFT FEMORAL-POPLITEAL ARTERY USING GORE PROPATEN X 80CM VASCULAR GRAFT;  Surgeon: Chuck Hint, MD;  Location: [facility] OR;  Service: Vascular;  Laterality: Left;  . HOT HEMOSTASIS N/A 07/10/2016   Procedure: HOT HEMOSTASIS (ARGON PLASMA COAGULATION/BICAP);  Surgeon: Carman Ching, MD;  Location: [facility] ENDOSCOPY;  Service: Endoscopy;  Laterality: N/A;  . IMPLANTABLE CARDIOVERTER DEFIBRILLATOR (ICD) GENERATOR CHANGE N/A 12/16/2012   Procedure: ICD GENERATOR CHANGE;  Surgeon: [name], MD;  Location: [facility] CATH LAB;  Service: Cardiovascular;  Laterality: N/A;  . INSERTION OF IMPLANTABLE LEFT VENTRICULAR ASSIST DEVICE N/A 05/30/2016   Procedure: INSERTION OF IMPLANTABLE LEFT VENTRICULAR ASSIST DEVICE;  Surgeon: Kerin Perna, MD;  Location: [facility] OR;  Service: Open Heart Surgery;  Laterality: N/A;  CIRC ARREST  NITRIC OXIDE  . INTRAOPERATIVE ARTERIOGRAM Right 11/07/2013   Procedure: INTRA OPERATIVE ARTERIOGRAM;  Surgeon: Chuck Hint, MD;  Location: [facility] OR;  Service: Vascular;  Laterality: Right;  . LOWER EXTREMITY ANGIOGRAM Bilateral 11/06/2013   Procedure: LOWER EXTREMITY ANGIOGRAM;  Surgeon: Chuck Hint, MD;  Location: [facility] CATH LAB;  Service: Cardiovascular;  Laterality: Bilateral;  . Medtronic Virtuoso II DR dual-chamber cardioverter-defibrillation with pocket revison     Dr. Sherryl Manges  . MULTIPLE EXTRACTIONS WITH ALVEOLOPLASTY N/A 04/26/2016   Procedure: Extraction of tooth #'s 5-14 and 20-30 with alveoloplasty;  Surgeon: Charlynne Pander, DDS;   Location: [facility] OR;  Service: Oral Surgery;  Laterality: N/A;  . PERIPHERAL VASCULAR CATHETERIZATION N/A 09/25/2014   Procedure: Abdominal Aortogram;  Surgeon: Sherren Kerns, MD;  Location: [facility] INVASIVE CV LAB;  Service: Cardiovascular;  Laterality: N/A;  . REMOVAL OF PLEURAL DRAINAGE CATHETER Right 06/13/2016   Procedure: REMOVAL OF PLEURAL DRAINAGE CATHETER;  Surgeon: Kerin Perna, MD;  Location: [facility] OR;  Service: Thoracic;  Laterality: Right;  . RIGHT HEART CATH N/A 05/01/2016   Procedure: Right Heart Cath;  Surgeon: Laurey Morale, MD;  Location: [facility] INVASIVE CV LAB;  Service: Cardiovascular;  Laterality: N/A;  . RIGHT HEART CATH N/A 05/25/2016   Procedure: Right Heart Cath;  Surgeon: Laurey Morale, MD;  Location: [facility] INVASIVE CV LAB;  Service: Cardiovascular;  Laterality: N/A;  . TEE WITHOUT CARDIOVERSION N/A 05/30/2016   Procedure: TRANSESOPHAGEAL ECHOCARDIOGRAM (TEE);  Surgeon: Donata Clay, Theron Arista, MD;  Location: [facility] OR;  Service: Open Heart Surgery;  Laterality: N/A;  . US GUIDED THORACENTESIS RIGHT ([facility] HX) Right 01/05/2016     Social History   Socioeconomic History  . Marital status: Divorced    Spouse name: Not on file  .  Number of children: 2  . Years of education: Not on file  . Highest education level: Not on file  Occupational History  . Occupation: Retail buyer: [employer]    Comment: Full time  Social Needs  . Financial resource strain: Not on file  . Food insecurity:    Worry: Not on file    Inability: Not on file  . Transportation needs:    Medical: Not on file    Non-medical: Not on file  Tobacco Use  . Smoking status: Current Every Day Smoker    Packs/day: 0.50    Years: 45.00    Pack years: 22.50    Types: Cigarettes  . Smokeless tobacco: Never Used  . Tobacco comment: pt reports he is stress smoker  Substance and Sexual Activity  . Alcohol use: No    Alcohol/week: 0.0 oz  . Drug use: No    Comment: former Cocain, Acid,  Marijuana - 25 years ago  . Sexual activity: Never  Lifestyle  . Physical activity:    Days per week: Not on file    Minutes per session: Not on file  . Stress: Not on file  Relationships  . Social connections:    Talks on phone: Not on file    Gets together: Not on file    Attends religious service: Not on file    Active member of club or organization: Not on file    Attends meetings of clubs or organizations: Not on file    Relationship status: Not on file  . Intimate partner violence:    Fear of current or ex partner: Not on file    Emotionally abused: Not on file    Physically abused: Not on file    Forced sexual activity: Not on file  Other Topics Concern  . Not on file  Social History Narrative   Divorced. 2 children.   Previously worked at [employer].    Family History  Problem Relation Age of Onset  . Diabetes Mother   . Coronary artery disease Father   . Heart disease Father        before age 74    Current Facility-Administered Medications  Medication Dose Route Frequency Provider Last Rate Last Dose  . 0.9 %  sodium chloride infusion  250 mL Intravenous PRN Laurey Morale, MD      . 0.9 %  sodium chloride infusion   Intravenous Continuous Laurey Morale, MD 10 mL/hr at 06/07/17 [phone]    . acetaminophen (TYLENOL) tablet 650 mg  650 mg Oral Q4H PRN Laurey Morale, MD       Or  . acetaminophen (TYLENOL) suppository 650 mg  650 mg Rectal Q4H PRN Laurey Morale, MD      . acetaminophen (TYLENOL) tablet 650 mg  650 mg Oral Q4H PRN Laurey Morale, MD      . atorvastatin (LIPITOR) tablet 80 mg  80 mg Oral q1800 Laurey Morale, MD      . danazol (DANOCRINE) capsule 100 mg  100 mg Oral BID Laurey Morale, MD      . ezetimibe (ZETIA) tablet 10 mg  10 mg Oral Daily Laurey Morale, MD      . ferrous sulfate tablet 325 mg  325 mg Oral BID WC Laurey Morale, MD   325 mg at 06/07/17 0737  . furosemide (LASIX) injection 40 mg  40 mg Intravenous BID Laurey Morale,  MD   40 mg at 06/07/17 0830  . insulin aspart (novoLOG) injection 0-15 Units  0-15 Units Subcutaneous TID WC Laurey MoraleMcLean, Dalton S, MD   5 Units at 06/07/17 [phone]  . insulin aspart (novoLOG) injection 0-5 Units  0-5 Units Subcutaneous QHS Laurey MoraleMcLean, Dalton S, MD      . insulin aspart protamine- aspart (NOVOLOG MIX 70/30) injection 12 Units  12 Units Subcutaneous BID WC Laurey MoraleMcLean, Dalton S, MD   12 Units at 06/07/17 [phone]  . MEDLINE mouth rinse  15 mL Mouth Rinse BID Laurey MoraleMcLean, Dalton S, MD      . metoCLOPramide [facility](REGLAN) tablet 5 mg  5 mg Oral TID AC Laurey MoraleMcLean, Dalton S, MD   5 mg at 06/07/17 0737  . ondansetron (ZOFRAN) injection 4 mg  4 mg Intravenous Q6H PRN Laurey MoraleMcLean, Dalton S, MD      . pantoprazole (PROTONIX) EC tablet 40 mg  40 mg Oral BID Laurey MoraleMcLean, Dalton S, MD      . piperacillin-tazobactam (ZOSYN) IVPB 3.375 g  3.375 g Intravenous Q8H Stevphen RochesterLedford, James L, [facility]   Stopped at 06/07/17 [phone]  . potassium chloride SA (K-DUR,KLOR-CON) CR tablet 40 mEq  40 mEq Oral Once Laurey MoraleMcLean, Dalton S, MD      . potassium chloride SA (K-DUR,KLOR-CON) CR tablet 40 mEq  40 mEq Oral BID Laurey MoraleMcLean, Dalton S, MD      . sertraline (ZOLOFT) tablet 25 mg  25 mg Oral Daily Laurey MoraleMcLean, Dalton S, MD      . sildenafil (REVATIO) tablet 20 mg  20 mg Oral TID Laurey MoraleMcLean, Dalton S, MD      . sodium chloride flush (NS) 0.9 % injection 3 mL  3 mL Intravenous Q12H Laurey MoraleMcLean, Dalton S, MD      . sodium chloride flush (NS) 0.9 % injection 3 mL  3 mL Intravenous PRN Laurey MoraleMcLean, Dalton S, MD      . vancomycin (VANCOCIN) 1,250 mg in sodium chloride 0.9 % 250 mL IVPB  1,250 mg Intravenous Q24H Stevphen RochesterLedford, James L, RPH        Allergies  Allergen Reactions  . Metformin And Related Nausea And Vomiting    *Only the extended release* (does this)  . Niacin And Related Other (See Comments)    REACTION IS SIDE EFFECT "SEVERE" FLUSHING    REVIEW OF SYSTEMS (negative unless checked):   Cardiac:  []  Chest pain or chest pressure? []  Shortness of breath upon activity? []   Shortness of breath when lying flat? []  Irregular heart rhythm?  Vascular:  [x]  Pain in calf, thigh, or hip brought on by walking? []  Pain in feet at night that wakes you up from your sleep? []  Blood clot in your veins? [x]  Leg swelling?  Pulmonary:  []  Oxygen at home? []  Productive cough? []  Wheezing?  Neurologic:  []  Sudden weakness in arms or legs? []  Sudden numbness in arms or legs? []  Sudden onset of difficult speaking or slurred speech? []  Temporary loss of vision in one eye? []  Problems with dizziness?  Gastrointestinal:  []  Blood in stool? []  Vomited blood?  Genitourinary:  []  Burning when urinating? []  Blood in urine?  Psychiatric:  []  Major depression  Hematologic:  []  Bleeding problems? []  Problems with blood clotting?  Dermatologic:  []  Rashes or ulcers?  Constitutional:  []  Fever or chills?  Ear/Nose/Throat:  []  Change in hearing? []  Nose bleeds? []  Sore throat?  Musculoskeletal:  []  Back pain? []  Joint pain? []  Muscle pain?   For VQI Use Only  PRE-ADM LIVING Home  AMB STATUS Ambulatory  CAD Sx History of MI, but no symptoms No MI within 6 months  PRIOR CHF Severe  STRESS TEST No    Physical Examination     Vitals:   06/07/17 0800 06/07/17 0814 06/07/17 0850 06/07/17 0900  BP: 101/75  101/75 94/81  Pulse: (!) 111  91   Resp: (!) 22   19  Temp:  99 F (37.2 C)    TempSrc:  Oral    SpO2: 100%     Weight:      Height:       Body mass index is 31.7 kg/m.  General Alert, O x 3, WD, NAD  Head [place]/AT,    Neck Supple, mid-line trachea,    Pulmonary Sym exp, good B air movt,   Cardiac RRR, Nl S1, S2,   Vascular Vessel Right Left  Radial Palpable Palpable  Aorta Not palpable N/A  Femoral Palpable Faintly palpable  Popliteal Not palpable Not palpable  PT Not palpable Not palpable  DP Not palpable Not palpable    Gastro- intestinal soft, non-distended,  Musculo- skeletal M/S 5/5 throughout  , Extremities without ischemic  changes  , Pitting edema present: RLE, No visible varicosities , No Lipodermatosclerosis present; feet are symmetrically warm to touch; venous stasis and pigmentation changes bilateral lower extremities; right medial surface of second toe blistering; right ATA by Doppler  Neurologic Cranial nerves 2-12 intact ,   Psychiatric Judgement intact, Mood & affect appropriate for pt's clinical situation  Dermatologic See M/S exam for extremity exam, No rashes otherwise noted    Non-invasive Vascular Imaging      Right lower extremity arterial duplex and ABIs pending  Medical Decision Making   [name] is a 62 y.o. male who presents with radiographic evidence of osteomyelitis of right second toe   Patient being transitioned from warfarin to IV heparin  Plans noted for Dr. Logan Bores to perform right second toe amputation  ABIs and RLE arterial duplex ordered to assess bypass patency  Recommendations pending results  Dr. Imogene Burn will evaluate the patient later today  Emilie Rutter, PA-C [facility] [place] Office: [phone]  06/07/2017, 9:56 AM   Addendum  I have independently interviewed and examined the patient, and I agree with the physician assistant's findings.  ABI reviewed.  Waveforms not appreciably any different.  Fem-pop patent on arterial duplex.     Nothing more to add from a Vascular Surgical viewpoint  Ok to proceed with R toe amputation as per Dr. Logan Bores' plan.  Available as needed.  Leonides Sake, MD, FACS [facility] [place] Office: [phone] Pager: [phone]  06/07/2017, 12:36 PM

## 2017-06-07 NOTE — ED Notes (Signed)
[name], EMT recorded manual MAP of 86

## 2017-06-07 NOTE — Evaluation (Addendum)
Physical Therapy Evaluation Patient Details Name: Dalton RainwaterLarry D [name] MRN: [id number] DOB: 22-Oct-1955 Today's Date: 06/07/2017   History of Present Illness  62 y.o. male with septic schock s/p ulceration of R 2nd toe. PMH includes: DM, PVD, Osteo, HVAD, AICD, Arthritis, Dyspnea on Exertion, Heart murmur, CAD, CHF, HTN, HLD, Barretts Disease, Cerebrovascular disease, cardiomyopathy  Clinical Impression  Pt in generalized pain and not feeling well this session. Pt agreeable to mobilize from bed>recliner chair. Pt transfer ability improved based on previous RN report as pt was able to stand min guard from bed. Pt demonstrates limited mobility and would benefit from skilled PT to address mobility deficits to improve level of independence and quality of life.     Follow Up Recommendations Home health PT;Supervision - Intermittent    Equipment Recommendations  None recommended by PT    Recommendations for Other Services       Precautions / Restrictions Precautions Precautions: Fall Precaution Comments: Nosebleed chronically Restrictions Weight Bearing Restrictions: No      Mobility  Bed Mobility Overal bed mobility: Needs Assistance Bed Mobility: Supine to Sit     Supine to sit: Supervision;HOB elevated     General bed mobility comments: Supervision for safety  Transfers Overall transfer level: Needs assistance Equipment used: Rolling walker (2 wheeled) Transfers: Sit to/from Stand Sit to Stand: Min guard         General transfer comment: Pt performed sit>stand pulling up on RW; Pt immediately stated that he performed sit>stand with improper technique. Min guard for safety  Ambulation/Gait Ambulation/Gait assistance: Min guard Ambulation Distance (Feet): 4 Feet Assistive device: Rolling walker (2 wheeled) Gait Pattern/deviations: Step-through pattern;Decreased stride length;Trunk flexed Gait velocity: Decreased   General Gait Details: Pt ambulated 4 ft to recliner chair  from bed. Pt declined further ambulation due to pain and feeling ill. Min guard for [employer]  Stairs            Wheelchair Mobility    Modified Rankin (Stroke Patients Only)       Balance Overall balance assessment: Mild deficits observed, not formally tested                                           Pertinent Vitals/Pain Pain Assessment: 0-10 Pain Score: 6  Pain Location: Generalized Pain Descriptors / Indicators: Constant;Discomfort Pain Intervention(s): Limited activity within patient's tolerance;Monitored during session    Home Living Family/patient expects to be discharged to:: Private residence Living Arrangements: Parent Available Help at Discharge: Family;Available 24 hours/day Type of Home: House Home Access: Level entry     Home Layout: Able to live on main level with bedroom/bathroom Home Equipment: Walker - 2 wheels;Cane - single point Additional Comments: Pt lives with father who will be primary caregiver; Pt reports not using AD     Prior Function Level of Independence: Independent with assistive device(s)         Comments: driving, 2 falls due to syncope     Hand Dominance   Dominant Hand: Right    Extremity/Trunk Assessment   Upper Extremity Assessment Upper Extremity Assessment: Overall WFL for tasks assessed    Lower Extremity Assessment Lower Extremity Assessment: Overall WFL for tasks assessed       Communication   Communication: No difficulties  Cognition Arousal/Alertness: Awake/alert Behavior During Therapy: WFL for tasks assessed/performed Overall Cognitive Status: Within Functional Limits for tasks assessed  General Comments General comments (skin integrity, edema, etc.): Pt had nosebleed when sitting up which pt reports is chronic    Exercises     Assessment/Plan    PT Assessment Patient needs continued PT services  PT Problem List Decreased  activity tolerance;Decreased balance;Decreased mobility;Decreased knowledge of use of DME;Decreased strength       PT Treatment Interventions DME instruction;Gait training;Functional mobility training;Therapeutic activities;Stair training;Therapeutic exercise;Balance training;Neuromuscular re-education;Patient/family education    PT Goals (Current goals can be found in the Care Plan section)  Acute Rehab PT Goals Patient Stated Goal: To go home and "get back to normal" PT Goal Formulation: With patient Time For Goal Achievement: 06/21/17 Potential to Achieve Goals: Fair    Frequency Min 3X/week   Barriers to discharge   Current mobility level    Co-evaluation               AM-PAC PT "6 Clicks" Daily Activity  Outcome Measure Difficulty turning over in bed (including adjusting bedclothes, sheets and blankets)?: None Difficulty moving from lying on back to sitting on the side of the bed? : A Little Difficulty sitting down on and standing up from a chair with arms (e.g., wheelchair, bedside commode, etc,.)?: A Little Help needed moving to and from a bed to chair (including a wheelchair)?: A Little Help needed walking in hospital room?: A Little Help needed climbing 3-5 steps with a railing? : A Lot 6 Click Score: 18    End of Session Equipment Utilized During Treatment: Gait belt Activity Tolerance: Patient tolerated treatment well Patient left: with call bell/phone within reach;with chair alarm set;in chair Nurse Communication: Mobility status PT Visit Diagnosis: Other abnormalities of gait and mobility (R26.89);History of falling (Z91.81)    Time: 1610-9604 PT Time Calculation (min) (ACUTE ONLY): 22 min   Charges:   PT Evaluation $PT Eval Moderate Complexity: 1 Mod     PT G Codes:        Gabe Lakeyta Vandenheuvel, SPT  [employer] 06/07/2017, 9:07 AM

## 2017-06-07 NOTE — Telephone Encounter (Signed)
[facility] - Maxine GlennMichael Tillery, PA asked if Dr. Logan BoresEvans would like to do the amputation on pt referred to the ED for sepsis or should they contact Dr. Lajoyce Cornersuda.

## 2017-06-07 NOTE — Telephone Encounter (Signed)
-----   Message from Felecia ShellingBrent M Evans, DPM sent at 06/06/2017 10:12 AM EDT ----- Regarding: Vascular consult Please refer patient to vascular and vein specialists with Dr. Jaynie Collinshristopher Dixon.  Surgery scheduled for second toe amputation right foot Diagnosis PVD right foot   Thanks, Dr. Logan BoresEvans

## 2017-06-07 NOTE — Progress Notes (Signed)
   Daily Progress Note  Chart briefly reviewed.  Full consult to follow.  Pt has known L 2nd toe osteomyelitis and has had R CFA to BK pop bypass with vein.      Dr. Michel HarrowEvan's note documents his plan for management of the toe.  Please consult Dr. Logan BoresEvans as he comes to the hospital.  ABI and RLE arterial duplex ordered.  If bypass is open and ABI not significant different, not much to add from vascular viewpoint.   Leonides SakeBrian Chen, MD, FACS [facility] [place] Office: [phone] Pager: [phone]  06/07/2017, 8:52 AM

## 2017-06-07 NOTE — Progress Notes (Signed)
LVAD Coordinator Rounding Note:  Admitted 06/06/17 from ED for sepsis.  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 Maren BeachVanTrigt under Destination Therapy criteria due to current smoking status.  Vital signs: Temp: 99 HR: 99 Doppler Pressure: UTO Automatic BP:  101/75 (84) O2 Sat: 100% on 2 L Wt: 240lbs  HVAD Interrogation Reveals: Speed: 2700 Flow: 3.5 Power: 4.4w Peak/Trough: 5/2 Flow alarm limit: 2.0 Power alarm limit: 6.5 Hematocrit: 28 Suction alarm: on Lavare cycle: on  Drive Line:  Left abdominal sorbaview dressing dry and intact; anchor intact and accurately applied. Weekly dressings; bedside RN may change dressing has needed. Due on Monday 6/10.  Labs:  LDH trend: 159  INR trend: 2.35  WBC: 11.5>9.1  Anticoagulation Plan: -INR Goal: 2.0 - 2.5 -ASA Dose: no ASA due to hx of GI bleed  Device: - St Jude single lead -Therapies: on at 200 bpm  Adverse Events on VAD: 05/31/16 - Controller power-up associated with pump start event. Controller change out performed.  06/01/16 - Controller pins lubricated per St Jude reps  06/17/16 - Controller power-up associated with pump start event while changing power source 06/22/16 - Controller pins re-lubricated per [employer] reps    Plan/Recommendations:   1. Back up controller programmed and at bedside.  2. Please call VAD pager with any VAD equipment or drive line issues  Carlton AdamSarah Herbert RN, VAD Coordinator 24/7 VAD Pager: [phone]

## 2017-06-07 NOTE — Telephone Encounter (Signed)
[facility] and she states she will give to [place] to give to Dr. Logan BoresEvans.

## 2017-06-08 ENCOUNTER — Inpatient Hospital Stay (HOSPITAL_COMMUNITY): Payer: BLUE CROSS/BLUE SHIELD

## 2017-06-08 ENCOUNTER — Inpatient Hospital Stay (HOSPITAL_COMMUNITY): Payer: BLUE CROSS/BLUE SHIELD | Admitting: Certified Registered Nurse Anesthetist

## 2017-06-08 ENCOUNTER — Encounter (HOSPITAL_COMMUNITY): Payer: Self-pay

## 2017-06-08 ENCOUNTER — Encounter (HOSPITAL_COMMUNITY)
Admission: EM | Disposition: A | Discharge status: Home Health | Payer: Self-pay | Source: Home / Self Care | Attending: Cardiology

## 2017-06-08 DIAGNOSIS — Z79899 Other long term (current) drug therapy: Secondary | ICD-10-CM

## 2017-06-08 DIAGNOSIS — R05 Cough: Secondary | ICD-10-CM

## 2017-06-08 HISTORY — PX: AMPUTATION TOE: SHX6595

## 2017-06-08 LAB — CBC
HCT: 26.8 % — ABNORMAL LOW (ref 39.0–52.0)
HEMATOCRIT: 25.5 % — AB (ref 39.0–52.0)
HEMOGLOBIN: 8.4 g/dL — AB (ref 13.0–17.0)
Hemoglobin: 8.1 g/dL — ABNORMAL LOW (ref 13.0–17.0)
MCH: 26.8 pg (ref 26.0–34.0)
MCH: 27.3 pg (ref 26.0–34.0)
MCHC: 31.8 g/dL (ref 30.0–36.0)
MCHC: 31.3 g/dL (ref 30.0–36.0)
MCV: 84.4 fL (ref 78.0–100.0)
MCV: 87 fL (ref 78.0–100.0)
PLATELETS: 120 10*3/uL — AB (ref 150–400)
Platelets: 119 10*3/uL — ABNORMAL LOW (ref 150–400)
RBC: 3.02 MIL/uL — ABNORMAL LOW (ref 4.22–5.81)
RBC: 3.08 MIL/uL — ABNORMAL LOW (ref 4.22–5.81)
RDW: 17.5 % — ABNORMAL HIGH (ref 11.5–15.5)
RDW: 17.4 % — ABNORMAL HIGH (ref 11.5–15.5)
WBC: 5.9 10*3/uL (ref 4.0–10.5)
WBC: 5.2 10*3/uL (ref 4.0–10.5)

## 2017-06-08 LAB — BASIC METABOLIC PANEL
ANION GAP: 6 (ref 5–15)
Anion gap: 9 (ref 5–15)
BUN: 13 mg/dL (ref 6–20)
BUN: 9 mg/dL (ref 6–20)
CALCIUM: 8.2 mg/dL — AB (ref 8.9–10.3)
CO2: 26 mmol/L (ref 22–32)
CO2: 26 mmol/L (ref 22–32)
CREATININE: 1.13 mg/dL (ref 0.61–1.24)
Calcium: 8.3 mg/dL — ABNORMAL LOW (ref 8.9–10.3)
Chloride: 100 mmol/L — ABNORMAL LOW (ref 101–111)
Chloride: 100 mmol/L — ABNORMAL LOW (ref 101–111)
Creatinine, Ser: 1.06 mg/dL (ref 0.61–1.24)
GFR calc Af Amer: >60 mL/min (ref >60)
GFR calc non Af Amer: >60 mL/min (ref >60)
Glucose, Bld: 148 mg/dL — ABNORMAL HIGH (ref 65–99)
Glucose, Bld: 144 mg/dL — ABNORMAL HIGH (ref 65–99)
Potassium: 3.7 mmol/L (ref 3.5–5.1)
Potassium: 4 mmol/L (ref 3.5–5.1)
SODIUM: 132 mmol/L — AB (ref 135–145)
Sodium: 135 mmol/L (ref 135–145)

## 2017-06-08 LAB — PROTIME-INR
INR: 1.98
INR: 1.75
INR: 1.63
Prothrombin Time: 22.3 seconds — ABNORMAL HIGH (ref 11.4–15.2)
Prothrombin Time: 20.3 seconds — ABNORMAL HIGH (ref 11.4–15.2)
Prothrombin Time: 19.2 seconds — ABNORMAL HIGH (ref 11.4–15.2)

## 2017-06-08 LAB — GLUCOSE, CAPILLARY
GLUCOSE-CAPILLARY: 156 mg/dL — AB (ref 65–99)
Glucose-Capillary: 178 mg/dL — ABNORMAL HIGH (ref 65–99)
Glucose-Capillary: 140 mg/dL — ABNORMAL HIGH (ref 65–99)
Glucose-Capillary: 189 mg/dL — ABNORMAL HIGH (ref 65–99)

## 2017-06-08 LAB — LACTATE DEHYDROGENASE
LDH: 181 U/L (ref 98–192)
LDH: 176 U/L (ref 98–192)

## 2017-06-08 SURGERY — AMPUTATION, TOE
Anesthesia: Monitor Anesthesia Care | Site: Toe | Laterality: Right

## 2017-06-08 MED ORDER — FENTANYL CITRATE (PF) 250 MCG/5ML IJ SOLN
INTRAMUSCULAR | Status: AC
  Filled 2017-06-08: qty 5

## 2017-06-08 MED ORDER — OXYCODONE-ACETAMINOPHEN 5-325 MG PO TABS
1.0000 | ORAL_TABLET | Freq: Four times a day (QID) | ORAL | Status: DC | PRN
Start: 2017-06-08 — End: 2017-06-12
  Administered 2017-06-09 (×3): 1 via ORAL
  Filled 2017-06-08 (×3): qty 1

## 2017-06-08 MED ORDER — SALINE SPRAY 0.65 % NA SOLN
1.0000 | NASAL | Status: DC | PRN
  Administered 2017-06-09 – 2017-06-11 (×2): 1 via NASAL
  Filled 2017-06-08: qty 44

## 2017-06-08 MED ORDER — MIDAZOLAM HCL 2 MG/2ML IJ SOLN
1.0000 mg | INTRAMUSCULAR | Status: DC | PRN
  Administered 2017-06-08: 2 mg via INTRAVENOUS

## 2017-06-08 MED ORDER — TRAMADOL HCL 50 MG PO TABS
50.0000 mg | ORAL_TABLET | Freq: Four times a day (QID) | ORAL | Status: DC | PRN
  Administered 2017-06-08 – 2017-06-11 (×3): 50 mg via ORAL
  Filled 2017-06-08 (×3): qty 1

## 2017-06-08 MED ORDER — WARFARIN SODIUM 7.5 MG PO TABS
7.5000 mg | ORAL_TABLET | Freq: Once | ORAL | Status: AC
  Administered 2017-06-08: 7.5 mg via ORAL
  Filled 2017-06-08: qty 1

## 2017-06-08 MED ORDER — FENTANYL CITRATE (PF) 100 MCG/2ML IJ SOLN
INTRAMUSCULAR | Status: AC
  Filled 2017-06-08: qty 2

## 2017-06-08 MED ORDER — LIDOCAINE-EPINEPHRINE (PF) 1.5 %-1:200000 IJ SOLN
INTRAMUSCULAR | Status: DC | PRN
  Administered 2017-06-08: 10 mL via PERINEURAL

## 2017-06-08 MED ORDER — MIDAZOLAM HCL 2 MG/2ML IJ SOLN
INTRAMUSCULAR | Status: AC
  Administered 2017-06-08: 2 mg via INTRAVENOUS
  Filled 2017-06-08: qty 2

## 2017-06-08 MED ORDER — PROPOFOL 10 MG/ML IV BOLUS
INTRAVENOUS | Status: AC
  Filled 2017-06-08: qty 20

## 2017-06-08 MED ORDER — ENSURE ENLIVE PO LIQD: 237.0000 mL | Freq: Two times a day (BID) | ORAL | Status: DC

## 2017-06-08 MED ORDER — SODIUM CHLORIDE 0.9 % IV SOLN: 250.0000 mL | INTRAVENOUS | Status: DC | PRN

## 2017-06-08 MED ORDER — WARFARIN SODIUM 7.5 MG PO TABS: 7.5000 mg | ORAL_TABLET | Freq: Once | ORAL | Status: DC

## 2017-06-08 MED ORDER — LIDOCAINE-EPINEPHRINE 1 %-1:100000 IJ SOLN
INTRAMUSCULAR | Status: AC
  Filled 2017-06-08: qty 1

## 2017-06-08 MED ORDER — WARFARIN - PHARMACIST DOSING INPATIENT
Freq: Every day | Status: DC
  Administered 2017-06-08: 20:00:00

## 2017-06-08 MED ORDER — MIDAZOLAM HCL 2 MG/2ML IJ SOLN
INTRAMUSCULAR | Status: AC
  Filled 2017-06-08: qty 2

## 2017-06-08 MED ORDER — RIFAMPIN 300 MG PO CAPS
300.0000 mg | ORAL_CAPSULE | Freq: Two times a day (BID) | ORAL | Status: DC
  Administered 2017-06-08 – 2017-06-12 (×8): 300 mg via ORAL
  Filled 2017-06-08 (×10): qty 1

## 2017-06-08 MED ORDER — HEPARIN (PORCINE) IN NACL 100-0.45 UNIT/ML-% IJ SOLN
1100.0000 [IU]/h | INTRAMUSCULAR | Status: DC
  Administered 2017-06-08: 1100 [IU]/h via INTRAVENOUS
  Filled 2017-06-08: qty 250

## 2017-06-08 MED ORDER — BUPIVACAINE-EPINEPHRINE (PF) 0.5% -1:200000 IJ SOLN
INTRAMUSCULAR | Status: DC | PRN
  Administered 2017-06-08: 20 mL via PERINEURAL

## 2017-06-08 MED ORDER — WHITE PETROLATUM EX OINT: TOPICAL_OINTMENT | CUTANEOUS | Status: DC | PRN

## 2017-06-08 MED ORDER — BUPIVACAINE HCL (PF) 0.5 % IJ SOLN
INTRAMUSCULAR | Status: AC
  Filled 2017-06-08: qty 30

## 2017-06-08 MED ORDER — FENTANYL CITRATE (PF) 100 MCG/2ML IJ SOLN: 25.0000 ug | INTRAMUSCULAR | Status: DC | PRN

## 2017-06-08 MED ORDER — WARFARIN - PHARMACIST DOSING INPATIENT: Freq: Every day | Status: DC

## 2017-06-08 MED ORDER — 0.9 % SODIUM CHLORIDE (POUR BTL) OPTIME
TOPICAL | Status: DC | PRN
  Administered 2017-06-08: 1000 mL

## 2017-06-08 MED ORDER — LACTATED RINGERS IV SOLN
INTRAVENOUS | Status: DC
  Administered 2017-06-08 – 2017-06-11 (×2): via INTRAVENOUS

## 2017-06-08 SURGICAL SUPPLY — 42 items
BANDAGE ACE 4X5 VEL STRL LF (GAUZE/BANDAGES/DRESSINGS) ×2 IMPLANT
BLADE LONG MED 31MMX9MM (MISCELLANEOUS) ×1
BLADE LONG MED 31X9 (MISCELLANEOUS) ×1 IMPLANT
BLADE SURG 15 STRL LF DISP TIS (BLADE) IMPLANT
BLADE SURG 15 STRL SS (BLADE)
BNDG GAUZE ELAST 4 BULKY (GAUZE/BANDAGES/DRESSINGS) ×3 IMPLANT
CHLORAPREP W/TINT 26ML (MISCELLANEOUS) IMPLANT
COVER SURGICAL LIGHT HANDLE (MISCELLANEOUS) ×3 IMPLANT
CUFF TOURNIQUET SINGLE 18IN (TOURNIQUET CUFF) ×3 IMPLANT
CUFF TOURNIQUET SINGLE 24IN (TOURNIQUET CUFF) IMPLANT
DRSG PAD ABDOMINAL 8X10 ST (GAUZE/BANDAGES/DRESSINGS) ×3 IMPLANT
ELECT REM PT RETURN 9FT ADLT (ELECTROSURGICAL)
ELECTRODE REM PT RTRN 9FT ADLT (ELECTROSURGICAL) IMPLANT
GAUZE SPONGE 4X4 12PLY STRL (GAUZE/BANDAGES/DRESSINGS) ×3 IMPLANT
GAUZE XEROFORM 1X8 LF (GAUZE/BANDAGES/DRESSINGS) ×3 IMPLANT
GLOVE BIO SURGEON STRL SZ8 (GLOVE) ×3 IMPLANT
GLOVE BIOGEL PI IND STRL 8 (GLOVE) ×1 IMPLANT
GLOVE BIOGEL PI INDICATOR 8 (GLOVE) ×2
GOWN STRL REUS W/ TWL LRG LVL3 (GOWN DISPOSABLE) ×2 IMPLANT
GOWN STRL REUS W/TWL LRG LVL3 (GOWN DISPOSABLE) ×6
KIT BASIN OR (CUSTOM PROCEDURE TRAY) ×3 IMPLANT
KIT TURNOVER KIT B (KITS) ×3 IMPLANT
NEEDLE 27GAX1X1/2 (NEEDLE) ×3 IMPLANT
NS IRRIG 1000ML POUR BTL (IV SOLUTION) ×3 IMPLANT
PACK ORTHO EXTREMITY (CUSTOM PROCEDURE TRAY) ×3 IMPLANT
PAD ARMBOARD 7.5X6 YLW CONV (MISCELLANEOUS) ×6 IMPLANT
PAD CAST 4YDX4 CTTN HI CHSV (CAST SUPPLIES) ×1 IMPLANT
PADDING CAST COTTON 4X4 STRL (CAST SUPPLIES) ×3
SCRUB BETADINE 4OZ XXX (MISCELLANEOUS) ×3 IMPLANT
SOL PREP POV-IOD 4OZ 10% (MISCELLANEOUS) IMPLANT
SUT PROLENE 3 0 PS 2 (SUTURE) ×2 IMPLANT
SUT PROLENE 4 0 PS 2 18 (SUTURE) ×3 IMPLANT
SUT VIC AB 3-0 PS2 18 (SUTURE) ×3 IMPLANT
SUT VICRYL 4-0 PS2 18IN ABS (SUTURE) ×3 IMPLANT
SWAB COLLECTION DEVICE MRSA (MISCELLANEOUS) ×2 IMPLANT
SWAB CULTURE ESWAB REG 1ML (MISCELLANEOUS) ×2 IMPLANT
SYR CONTROL 10ML LL (SYRINGE) ×3 IMPLANT
TOWEL OR 17X24 6PK STRL BLUE (TOWEL DISPOSABLE) ×3 IMPLANT
TOWEL OR 17X26 10 PK STRL BLUE (TOWEL DISPOSABLE) ×3 IMPLANT
TUBE CONNECTING 12'X1/4 (SUCTIONS) ×1
TUBE CONNECTING 12X1/4 (SUCTIONS) ×2 IMPLANT
YANKAUER SUCT BULB TIP NO VENT (SUCTIONS) IMPLANT

## 2017-06-08 NOTE — Brief Op Note (Signed)
06/08/2017  5:43 PM  PATIENT:  [name]  62 y.o. male  PRE-OPERATIVE DIAGNOSIS:  Infection. Osteomyelitis right 2nd toe.   POST-OPERATIVE DIAGNOSIS:  infection  PROCEDURE:  Procedure(s): AMPUTATION TOE (Right)  SURGEON:  Surgeon(s) and Role:    Felecia Shelling* Evans, Brent M, DPM - Primary  PHYSICIAN ASSISTANT:   ASSISTANTS: none   ANESTHESIA:   regional  EBL: minimal  BLOOD ADMINISTERED:none  DRAINS: none   LOCAL MEDICATIONS USED:  NONE  SPECIMEN:  Source of Specimen:  2nd metatarsal head right foot  DISPOSITION OF SPECIMEN:  PATHOLOGY  COUNTS:  YES  TOURNIQUET:   Total Tourniquet Time Documented: Calf (Right) - 23 minutes Total: Calf (Right) - 23 minutes   DICTATION: .Reubin Milanragon Dictation  PLAN OF CARE: Patient has active admission orders  PATIENT DISPOSITION:  PACU - hemodynamically stable.   Delay start of Pharmacological VTE agent (>24hrs) due to surgical blood loss or risk of bleeding: not applicable

## 2017-06-08 NOTE — Progress Notes (Addendum)
LVAD Coordinator Rounding Note:  Admitted 06/06/17 from ED for sepsis.  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 Maren BeachVanTrigt under Destination Therapy criteria due to current smoking status.  Planned right 2nd toe amputation today at 4 pm.  Vital signs: Temp: 98.5 HR: 85 Doppler Pressure:  90 Automatic BP:  99/82 (89) O2 Sat: 100% on RA Wt: 240>242 lbs  HVAD Interrogation Reveals: Speed: 2700 Flow: 4.0 Power: 4.2w Peak/Trough: 6.6/3.0 Alarms: none Flow alarm limit: 2.0 Power alarm limit: 6.5 Hematocrit: 28 Suction alarm: on Lavare cycle: on  Drive Line:  Left abdominal sorbaview dressing dry and intact; anchor intact and accurately applied. Weekly dressings; bedside RN may change dressing has needed. Due on Monday 6/10.  Labs:  LDH trend: 159>181  INR trend: 2.35>1.98  WBC: 11.5>9.1>5.9  Anticoagulation Plan: -INR Goal: 2.0 - 2.5 -ASA Dose: no ASA due to hx of GI bleed  Device: - St Jude single lead -Therapies: on at 200 bpm  Adverse Events on VAD: 05/31/16 - Controller power-up associated with pump start event. Controller change out performed.  06/01/16 - Controller pins lubricated per St Jude reps  06/17/16 - Controller power-up associated with pump start event while changing power source 06/22/16 - Controller pins re-lubricated per [employer] reps   Plan/Recommendations:   1. VAD coordinator will accompany patient to OR for toe amputation.  2. Please call VAD pager with any VAD equipment or drive line issues  Hessie DienerMolly Reece RN, VAD Coordinator 24/7 VAD Pager: [phone]

## 2017-06-08 NOTE — Progress Notes (Signed)
[facility]  Date of Admission:  06/06/2017             ASSESSMENT/PLAN  [name] is a 62 y/o male with previous medical history of CHF now with LVAD, PVD and diabetes found to have MSSA bacteremia with likely source being the right second toe diagnosed with osteomyelitis. He is on Day 3 of antimicrobial therapy and Day 2 of Ancef. Tolerating antibiotic with no adverse side effect. Concern remains for possible contamination/infection of his LVAD, ICD and Right IJ central line. May consider addition of rifampin pending TEE for investigation of devices. He did have a mild increased temperature last night with max of 100.4. Repeat blood cultures are in process. He is scheduled for surgery today for right 2nd toe amputation by Dr. [name].  Right 2nd toe osteomyelitis - Amputation planned for this afternoon. Additional course of antibiotics pending surgical results.  MSSA bacteremia - Continue Ancef. Recommend TEE for investigation of devices. May also consider addition of rifampin. Repeat cultures in process. Will continue to monitor.    Active Problems:   Septic shock (HCC)   . atorvastatin  80 mg Oral q1800  . Chlorhexidine Gluconate Cloth  6 each Topical Daily  . danazol  100 mg Oral BID  . ezetimibe  10 mg Oral Daily  . feeding supplement (ENSURE ENLIVE)  237 mL Oral BID BM  . ferrous sulfate  325 mg Oral BID WC  . furosemide  40 mg Intravenous BID  . insulin aspart  0-15 Units Subcutaneous TID WC  . insulin aspart  0-5 Units Subcutaneous QHS  . insulin aspart protamine- aspart  12 Units Subcutaneous BID WC  . mouth rinse  15 mL Mouth Rinse BID  . metoCLOPramide  5 mg Oral TID AC  . pantoprazole  40 mg Oral BID  . potassium chloride  40 mEq Oral BID  . sertraline  25 mg Oral Daily  . sildenafil  20 mg Oral TID  . sodium chloride flush  10-40 mL Intracatheter Q12H  . sodium chloride flush  3 mL Intravenous Q12H    SUBJECTIVE:  Mild elevated  temperature overnight. Feeling better today. Unable to get comfortable last night.  Scheduled for amputation of the right second toe this afternoon.   Allergies  Allergen Reactions  . Metformin And Related Nausea And Vomiting    *Only the extended release* (does this)  . Niacin And Related Other (See Comments)    REACTION IS SIDE EFFECT "SEVERE" FLUSHING     Review of Systems: Review of Systems  Constitutional: Positive for fever. Negative for chills and diaphoresis.  Respiratory: Positive for cough. Negative for shortness of breath and wheezing.   Cardiovascular: Negative for chest pain and leg swelling.  Gastrointestinal: Negative for abdominal pain, constipation, diarrhea, nausea and vomiting.  Genitourinary: Negative for dysuria, frequency, hematuria and urgency.  Skin: Negative for rash.      OBJECTIVE: Vitals:   06/08/17 0500 06/08/17 0600 06/08/17 0700 06/08/17 0800  BP:   (!) 89/75 95/81  Pulse: 82 83 79 79  Resp: (!) 22 (!) 22 (!) 22 (!) 22  Temp:    98.5 F (36.9 C)  TempSrc:    Oral  SpO2: 98% 92% 97% 99%  Weight: 242 lb 15.2 oz (110.2 kg)     Height:       Body mass index is 32.05 kg/m.  Physical Exam  Constitutional: He is oriented to person, place, and time. He appears  well-developed and well-nourished. No distress.  Cardiovascular:  LVAD device present and functioning appropriately with hum present  Pulmonary/Chest: Effort normal and breath sounds normal. No stridor. No respiratory distress. He has no wheezes. He has no rales. He exhibits no tenderness.  Neurological: He is alert and oriented to person, place, and time.  Skin: Skin is warm and dry.  Psychiatric: He has a normal mood and affect. His behavior is normal. Judgment and thought content normal.    Lab Results Lab Results  Component Value Date   WBC 5.9 06/08/2017   HGB 8.1 (L) 06/08/2017   HCT 25.5 (L) 06/08/2017   MCV 84.4 06/08/2017   PLT 120 (L) 06/08/2017    Lab Results    Component Value Date   CREATININE 1.13 06/08/2017   BUN 13 06/08/2017   NA 132 (L) 06/08/2017   K 3.7 06/08/2017   CL 100 (L) 06/08/2017   CO2 26 06/08/2017    Lab Results  Component Value Date   ALT 25 06/06/2017   AST 32 06/06/2017   ALKPHOS 61 06/06/2017   BILITOT 1.3 (H) 06/06/2017     Microbiology: Recent Results (from the past 240 hour(s))  Culture, blood (Routine x 2)     Status: Abnormal (Preliminary result)   Collection Time: 06/06/17 10:21 PM  Result Value Ref Range Status   Specimen Description BLOOD RIGHT ANTECUBITAL  Final   Special Requests   Final    BOTTLES DRAWN AEROBIC AND ANAEROBIC Blood Culture adequate volume   Culture  Setup Time   Final    GRAM POSITIVE COCCI IN BOTH AEROBIC AND ANAEROBIC BOTTLES CRITICAL RESULT CALLED TO, READ BACK BY AND VERIFIED WITH: PHARMD E MARTIN 06/07/17 AT 1312 BY CM    Culture (A)  Final    STAPHYLOCOCCUS AUREUS SUSCEPTIBILITIES TO FOLLOW Performed at [facility], [address] [place], [place] [address]    Report Status PENDING  Incomplete  Blood Culture ID Panel (Reflexed)     Status: Abnormal   Collection Time: 06/06/17 10:21 PM  Result Value Ref Range Status   Enterococcus species NOT DETECTED NOT DETECTED Final   Listeria monocytogenes NOT DETECTED NOT DETECTED Final   Staphylococcus species DETECTED (A) NOT DETECTED Final    Comment: CRITICAL RESULT CALLED TO, READ BACK BY AND VERIFIED WITH: PHARMD E MARTIN 06/07/17 AT 1312 BY CM    Staphylococcus aureus DETECTED (A) NOT DETECTED Final    Comment: Methicillin (oxacillin) susceptible Staphylococcus aureus (MSSA). Preferred therapy is anti staphylococcal beta lactam antibiotic (Cefazolin or Nafcillin), unless clinically contraindicated. CRITICAL RESULT CALLED TO, READ BACK BY AND VERIFIED WITH: PHARMD E MARTIN 06/07/17 AT 1312 BY CM    Methicillin resistance NOT DETECTED NOT DETECTED Final   Streptococcus species NOT DETECTED NOT DETECTED Final    Streptococcus agalactiae NOT DETECTED NOT DETECTED Final   Streptococcus pneumoniae NOT DETECTED NOT DETECTED Final   Streptococcus pyogenes NOT DETECTED NOT DETECTED Final   Acinetobacter baumannii NOT DETECTED NOT DETECTED Final   Enterobacteriaceae species NOT DETECTED NOT DETECTED Final   Enterobacter cloacae complex NOT DETECTED NOT DETECTED Final   Escherichia coli NOT DETECTED NOT DETECTED Final   Klebsiella oxytoca NOT DETECTED NOT DETECTED Final   Klebsiella pneumoniae NOT DETECTED NOT DETECTED Final   Proteus species NOT DETECTED NOT DETECTED Final   Serratia marcescens NOT DETECTED NOT DETECTED Final   Haemophilus influenzae NOT DETECTED NOT DETECTED Final   Neisseria meningitidis NOT DETECTED NOT DETECTED Final   Pseudomonas  aeruginosa NOT DETECTED NOT DETECTED Final   Candida albicans NOT DETECTED NOT DETECTED Final   Candida glabrata NOT DETECTED NOT DETECTED Final   Candida krusei NOT DETECTED NOT DETECTED Final   Candida parapsilosis NOT DETECTED NOT DETECTED Final   Candida tropicalis NOT DETECTED NOT DETECTED Final    Comment: Performed at [facility], [address], [place], [place] [address]  Culture, blood (Routine x 2)     Status: Abnormal (Preliminary result)   Collection Time: 06/06/17 10:29 PM  Result Value Ref Range Status   Specimen Description BLOOD RIGHT FOREARM  Final   Special Requests   Final    BOTTLES DRAWN AEROBIC AND ANAEROBIC Blood Culture adequate volume   Culture  Setup Time   Final    GRAM POSITIVE COCCI IN BOTH AEROBIC AND ANAEROBIC BOTTLES CRITICAL RESULT CALLED TO, READ BACK BY AND VERIFIED WITH: PHARMD E MARTIN 06/07/17 AT 1312 BY CM Performed at [facility], [address], [place], [place] [address]    Culture STAPHYLOCOCCUS AUREUS (A)  Final   Report Status PENDING  Incomplete  MRSA PCR Screening     Status: None   Collection Time: 06/07/17  1:43 AM  Result Value Ref Range Status   MRSA by PCR NEGATIVE NEGATIVE Final     Comment:        The GeneXpert MRSA Assay (FDA approved for NASAL specimens only), is one component of a comprehensive MRSA colonization surveillance program. It is not intended to diagnose MRSA infection nor to guide or monitor treatment for MRSA infections. Performed at [facility], [address], [place], [place] [address]      Marcos Eke, NP [facility] [phone] Pager  06/08/2017  9:04 AM

## 2017-06-08 NOTE — Anesthesia Postprocedure Evaluation (Signed)
Anesthesia Post Note  Patient: [name]  Procedure(s) Performed: AMPUTATION TOE (Right Toe)     Patient location during evaluation: PACU Anesthesia Type: Regional Level of consciousness: awake and alert Pain management: pain level controlled Vital Signs Assessment: post-procedure vital signs reviewed and stable Respiratory status: spontaneous breathing Cardiovascular status: stable Anesthetic complications: no    Last Vitals:  Vitals:   06/08/17 1730 06/08/17 1757  BP: 98/83 115/73  Pulse: 95 (!) 47  Resp: 17 14  Temp: (!) 36.2 C   SpO2: 99% 99%    Last Pain:  Vitals:   06/08/17 1730  TempSrc:   PainSc: 0-No pain                 Lewie LoronJohn Terrye Dombrosky

## 2017-06-08 NOTE — Anesthesia Procedure Notes (Signed)
Anesthesia Regional Block: Popliteal block   Pre-Anesthetic Checklist: ,, timeout performed, Correct Patient, Correct Site, Correct Laterality, Correct Procedure, Correct Position, site marked, Risks and benefits discussed,  Surgical consent,  Pre-op evaluation,  At surgeon's request and post-op pain management  Laterality: Lower and Right  Prep: chloraprep       Needles:  Injection technique: Single-shot  Needle Type: Echogenic Needle          Additional Needles:   Procedures:,,,, ultrasound used (permanent image in chart),,,,  Narrative:  Start time: 06/08/2017 3:51 PM End time: 06/08/2017 3:56 PM Injection made incrementally with aspirations every 5 mL.  Performed by: Personally   Additional Notes: H+P and labs reviewed, risks and benefits discussed with patient, procedure tolerated well without complications

## 2017-06-08 NOTE — Anesthesia Preprocedure Evaluation (Signed)
Anesthesia Evaluation  Patient identified by MRN, date of birth, ID band Patient awake    Reviewed: Allergy & Precautions, NPO status , Patient's Chart, lab work & pertinent test results  History of Anesthesia Complications Negative for: history of anesthetic complications  Airway Mallampati: I  TM Distance: >3 FB Neck ROM: Full    Dental  (+) Edentulous Upper, Edentulous Lower   Pulmonary shortness of breath, Current Smoker,    breath sounds clear to auscultation       Cardiovascular hypertension, + CAD  + pacemaker + Cardiac Defibrillator  Rhythm:Regular  LVAD placed 05/30/16   Neuro/Psych  Headaches,    GI/Hepatic negative GI ROS, Neg liver ROS,   Endo/Other  diabetes, Type 2  Renal/GU CRFRenal disease     Musculoskeletal   Abdominal   Peds  Hematology  (+) anemia ,   Anesthesia Other Findings LVAD in place  Reproductive/Obstetrics                             Anesthesia Physical Anesthesia Plan  ASA: IV  Anesthesia Plan: MAC and Regional   Post-op Pain Management:    Induction:   PONV Risk Score and Plan: 0 and Treatment may vary due to age or medical condition  Airway Management Planned: Nasal Cannula  Additional Equipment: None  Intra-op Plan:   Post-operative Plan:   Informed Consent: I have reviewed the patients History and Physical, chart, labs and discussed the procedure including the risks, benefits and alternatives for the proposed anesthesia with the patient or authorized representative who has indicated his/her understanding and acceptance.   Dental advisory given  Plan Discussed with: CRNA and Surgeon  Anesthesia Plan Comments:         Anesthesia Quick Evaluation

## 2017-06-08 NOTE — Progress Notes (Addendum)
Advanced Heart Failure VAD Team Note  PCP-Cardiologist: No primary care provider on file.   Subjective:    Admitted with sepsis/osteomyelitis. BCx 06/06/17 2/2 MSSA bacteremia. ID following. On cefazolin. Recommend TEE  Underwent toe amputation on 6/7  Feels ok this am. Able to take a few steps. Says his ribs and back are hurting. Denies SOB. Low-grade temps. T 99.8. Heparin stopped with INR 1.8. Renal function now back to baseline. Diuresing well but weight recorded as up 2 pounds.   MAP 80s. CVP 13-14  HVAD parameters RPM 2700 Flow 4.6 Peak 5.1 Trough 2.0 Suction On. HCT 26.3% this am.   Objective:    Vital Signs:   Temp:  [98.5 F (36.9 C)-100.4 F (38 C)] 98.5 F (36.9 C) (06/07 0400) Pulse Rate:  [45-111] 79 (06/07 0700) Resp:  [10-32] 22 (06/07 0700) BP: (84-125)/(64-97) 89/75 (06/07 0700) SpO2:  [92 %-100 %] 97 % (06/07 0700) Weight:  [242 lb 15.2 oz (110.2 kg)] 242 lb 15.2 oz (110.2 kg) (06/07 0500) Last BM Date: 06/07/17 Mean arterial Pressure 84  Intake/Output:   Intake/Output Summary (Last 24 hours) at 06/08/2017 0753 Last data filed at 06/08/2017 0600 Gross per 24 hour  Intake 1940 ml  Output 1849 ml  Net 91 ml    Physical Exam    CVP 13-14  General:  NAD. Sitting in chair HEENT: normal x ednetuolous Neck: supple. JVP to jaw Carotids 2+ bilat; no bruits. No lymphadenopathy or thryomegaly appreciated. Cor: LVAD hum.  Lungs: Clear. Abdomen: obese soft, nontender, non-distended. No hepatosplenomegaly. No bruits or masses. Good bowel sounds. Driveline site clean. Anchor in place.  Extremities: no cyanosis, clubbing, rash. Trace edema. Chronic venous stasis changes. R foot wrapped. RUE picc  Neuro: alert & oriented x 3. No focal deficits. Moves all 4 without problem    Telemetry   NSR 80s with frequent PVCs, personally reviewed.   EKG    No new tracings.    Labs   Basic Metabolic Panel: Recent Labs  Lab 06/06/17 2237 06/07/17 0430  06/08/17 0359  NA 129* 133* 132*  K 4.2 3.5 3.7  CL 95* 101 100*  CO2 22 23 26   GLUCOSE 202* 186* 148*  BUN 15 15 13   CREATININE 1.82* 1.46* 1.13  CALCIUM 8.3* 8.1* 8.2*    Liver Function Tests: Recent Labs  Lab 06/06/17 2237  AST 32  ALT 25  ALKPHOS 61  BILITOT 1.3*  PROT 7.3  ALBUMIN 2.9*   No results for input(s): LIPASE, AMYLASE in the last 168 hours. No results for input(s): AMMONIA in the last 168 hours.  CBC: Recent Labs  Lab 06/06/17 2237 06/07/17 0430 06/08/17 0359  WBC 11.5* 9.1 5.9  NEUTROABS 10.6*  --   --   HGB 9.6* 9.0* 8.1*  HCT 29.6* 28.2* 25.5*  MCV 84.8 85.5 84.4  PLT 139* 125* 120*   INR: Recent Labs  Lab 06/06/17 2237 06/07/17 0430 06/08/17 0359  INR 2.36 2.35 1.98   Other results:  EKG:   Imaging   Dg Chest Portable 1 View  Result Date: 06/07/2017 CLINICAL DATA:  Central line placement. EXAM: PORTABLE CHEST 1 VIEW COMPARISON:  Chest radiograph performed 06/06/2017 FINDINGS: The right IJ line is noted ending about the mid SVC. Mild bibasilar opacities may reflect atelectasis or mild interstitial edema. No pleural effusion or pneumothorax is seen. The cardiomediastinal silhouette is normal in size. The patient is status post median sternotomy. An AICD is noted at the left chest wall. A  left ventricular assist device is partially imaged. No acute osseous abnormalities are seen. IMPRESSION: 1. Right IJ line noted ending about the mid SVC. No pneumothorax. 2. Mild bibasilar airspace opacities may reflect atelectasis or mild interstitial edema. Electronically Signed   By: Roanna Raider M.D.   On: 06/07/2017 00:45   Dg Chest Port 1 View  Result Date: 06/06/2017 CLINICAL DATA:  Chills, weakness, and body aches. EXAM: PORTABLE CHEST 1 VIEW COMPARISON:  Chest x-ray dated April 11, 2017. FINDINGS: The bilateral costophrenic angles are not included in the field of view. Unchanged single lead left chest wall pacer device. Partially visualized LVAD.  Stable cardiomediastinal silhouette. Normal pulmonary vascularity. Stable bibasilar scarring. No consolidation, pneumothorax, or large pleural effusion. No acute osseous abnormality. Old right-sided rib fractures again noted. IMPRESSION: No active disease. Electronically Signed   By: Obie Dredge M.D.   On: 06/06/2017 23:45   Dg Foot Complete Right  Result Date: 06/06/2017 Please see detailed radiograph report in office note.    Medications:     Scheduled Medications: . atorvastatin  80 mg Oral q1800  . Chlorhexidine Gluconate Cloth  6 each Topical Daily  . danazol  100 mg Oral BID  . ezetimibe  10 mg Oral Daily  . feeding supplement (ENSURE ENLIVE)  237 mL Oral BID BM  . ferrous sulfate  325 mg Oral BID WC  . furosemide  40 mg Intravenous BID  . insulin aspart  0-15 Units Subcutaneous TID WC  . insulin aspart  0-5 Units Subcutaneous QHS  . insulin aspart protamine- aspart  12 Units Subcutaneous BID WC  . mouth rinse  15 mL Mouth Rinse BID  . metoCLOPramide  5 mg Oral TID AC  . pantoprazole  40 mg Oral BID  . potassium chloride  40 mEq Oral BID  . rifampin  300 mg Oral Q12H  . sertraline  25 mg Oral Daily  . sildenafil  20 mg Oral TID  . sodium chloride flush  10-40 mL Intracatheter Q12H  . sodium chloride flush  3 mL Intravenous Q12H  . warfarin  7.5 mg Oral ONCE-1800  . Warfarin - Pharmacist Dosing Inpatient   Does not apply q1800    Infusions: . sodium chloride    . sodium chloride 10 mL/hr at 06/08/17 0600  .  ceFAZolin (ANCEF) IV 2 g (06/08/17 0355)    PRN Medications: sodium chloride, acetaminophen **OR** acetaminophen, acetaminophen, ondansetron (ZOFRAN) IV, sodium chloride flush, sodium chloride flush   Patient Profile   [name] is a 62 y.o. malesmoker with history of PAD, CAD, and ischemic cardiomyopathy s/p HVAD placement on 05/30/16.   Admitted 06/06/17 with septic shock with osteomyelitis in R great toe.   Assessment/Plan:    1. MSSA  sepsis/septic shock due to R great toe osteo - Resolved, MAPs responded well to fluid bolus.   - BCx 06/06/17 2/2 MSSA. ID following. Now on Ancef and rifampin. Will need very close monitoring of INR - Tmax 99.8 - s/p right great toe amputation 6/7 - Plan TEE monday 2. Chronic systolic ZOX:[id number] cardiomyopathy - Echo in 3/18 with EF 20-25%. St Jude ICD.  - s/p HVAD placement. Lavare cycle is on - VAD interrogated personally. Parameters stable. - LDH stable at 168 - Losartan and spironolactone on hold with soft BP. Will restart spiro 12.5 - Continue lasix 40 mg IV BID. CVP 13-14.  - BAck on warfarin. INR 1.8. Stop heparin. With rifampin will need very close monitoring of INR 3.  CAD:  - 3 vessel CAD, s/p stent to proximal LAD and PTCA to subtotally occluded large OM1 in 1/18.  - No s/s of ischemia.    - Continue atorvastatin 80 mg daily.  4. PAD: - Stable claudication, notes with moderate walking on left. Left fem-pop bypass occluded. Now with right 2nd toe ulceration and suspected osteomyelitis.  - s/p amputation on R great toe on 6/7 - RLE Duplex 06/07/17 with patent gradt from previous fem-bop bypass. Unreliable waveforms with LVAD - ABIs 06/07/17 with non-compressible L/RLE arteries. Unable to obtain TBIs due to VAD waveforms.  5. Pulmonary arterial HTN:  - Continue Revatio 20 mg TID as pressure tolerated.  - No change to current plan.   6. Smoking:  - Encouraged complete cessation. No change 7. Upper GI bleeding: 7/18, small bowel AVMs. ASA was decreased to 81 daily. Warfarin INR goal is now 2-2.5. He started monthly octreotide injections but had profuse diarrhea with octreotide after 1st injection and after 2nd injection with decreased dose. He is tolerating danazol without problems. 12/18 had more melena in setting of high INR =>due to vigorous epistaxis versus AVM bleeding. ASA was stopped.  - No bleeding currently. Hgb stable 8.3 - He stay off ASA 81 and will continue INR  goal 2-2.5 for now. - Continue danazol.  8. L1 Burst Fracture:  - Stable. Once ambulatory needs to wear brace when walking.  9. Diabetes:  - Cover with home NPH and sliding scale. No change.   [facility]  Length of Stay: 2  [name], MD  11:06 AM   VAD Team --- VAD ISSUES ONLY--- Pager [phone] (7am - 7am)  Advanced Heart Failure Team  Pager [phone] (M-F; 7a - 4p)

## 2017-06-08 NOTE — Progress Notes (Signed)
ANTICOAGULATION CONSULT NOTE  Pharmacy Consult for Warfarin> heparin infusion Indication: LVAD  Allergies  Allergen Reactions  . Metformin And Related Nausea And Vomiting    *Only the extended release* (does this)  . Niacin And Related Other (See Comments)    REACTION IS SIDE EFFECT "SEVERE" FLUSHING   Patient Measurements: Height: 6\' 1"  (185.4 cm) Weight: 242 lb 15.2 oz (110.2 kg) IBW/kg (Calculated) : 79.9  Vital Signs: Temp: 98.5 F (36.9 C) (06/07 0400) Temp Source: Oral (06/07 0400) BP: 89/75 (06/07 0700) Pulse Rate: 79 (06/07 0700)  Labs: Recent Labs    06/06/17 2237 06/07/17 0430 06/08/17 0359  HGB 9.6* 9.0* 8.1*  HCT 29.6* 28.2* 25.5*  PLT 139* 125* 120*  LABPROT 25.6* 25.6* 22.3*  INR 2.36 2.35 1.98  CREATININE 1.82* 1.46* 1.13    Estimated Creatinine Clearance: 88.2 mL/min (by C-G formula based on SCr of 1.13 mg/dL).   Medical History: Past Medical History:  Diagnosis Date  . AICD (automatic cardioverter/defibrillator) present   . Arthritis   . Barretts syndrome   . Carotid artery disease (HCC)    a. Dopp 09/2011: 60-79% RICA, 40-59% LICA.;  b.  Carotid US (7/14):  Bilateral 60-79% => f/u 6 mos  . Cerebrovascular disease   . Chronic systolic heart failure (HCC)   . Coronary artery disease    a. AWMI requiring IABP 2005 s/p Horizon study stent-LAD, staged BMS-Cx, DESx2-RCA. b. Last Last LHC (6/06):  EF 25%, pLAD stent 40-50, after stent 40, dLAD 20, pCFX 20, pOM1 70, pRCA 20, mRCA stents ok.=> med Rx;  c.  Myoview (7/14):  EF 26%, large ant, septal, inf and apical infarct, no ischemia.  Med Rx continued  . Diabetes mellitus   . Dyspnea    with exertion  . Headache   . Heart murmur   . History of kidney stones   . HTN (hypertension)   . Hyperlipidemia    mixed  . ICD (implantable cardiac defibrillator), dual, in situ    St. Jude for severe LVD EF 25% 2/07 explanted 2010. Medtronic Virtuoso II DR Dual-chamber cardiverter-defibrillator  with pocket  revision, Dr. Graciela HusbandsKlein  . Ischemic cardiomyopathy    a. Echo (09/27/12): EF 20%, diffuse HK with periapical AK, no LV thrombus noted, restrictive physiology, trivial MR, mild LAE, RVSF mildly reduced, PASP 39  . Pleural effusion 01/05/2016  . Presence of permanent cardiac pacemaker   . PVD (peripheral vascular disease) (HCC)    a. ABI 09/2011 - R normal, L moderate - saw Dr. Kirke CorinArida - med rx.   Marland Kitchen. RIATA ICD Lead--on advisory recall    . Tobacco abuse     Assessment: 62 y/o M presents to the ED with sepsis, hx of LVAD pt on warfarin PTA with lower goal due to hx GIB. INR on admission is 2.36 - down to 1.98 today.   Hgb trending down slightly to 8.1, plt 120. No s/sx of bleeding. Patient might require possible amputation in future - plan to hold warfarin and cover with heparin infusion when INR<1.8. Will order ensure to help INR trend.   Goal of Therapy:  Heparin level: 0.3-0.5 (d/t hx of GIB) INR 2-2.5 (lower goal with hx GIB) Monitor platelets by anticoagulation protocol: Yes   Plan:  Hold warfarin tonight Monitor daily INR Start heparin once INR<1.8- recheck INR today at 1500  Monitor for bleeding  Girard CooterKimberly Perkins, PharmD Clinical Pharmacist  Pager: [phone] Phone: [phone] 06/08/2017,7:14 AM

## 2017-06-08 NOTE — Plan of Care (Signed)
  Problem: Health Behavior/Discharge Planning: Goal: Ability to manage health-related needs will improve Outcome: Progressing   Problem: Clinical Measurements: Goal: Ability to maintain clinical measurements within normal limits will improve Outcome: Progressing Goal: Diagnostic test results will improve Outcome: Progressing Goal: Respiratory complications will improve Outcome: Progressing   

## 2017-06-08 NOTE — Progress Notes (Signed)
ANTICOAGULATION CONSULT NOTE  Pharmacy Consult for Warfarin Indication: LVAD  Allergies  Allergen Reactions  . Metformin And Related Nausea And Vomiting    *Only the extended release* (does this)  . Niacin And Related Other (See Comments)    REACTION IS SIDE EFFECT "SEVERE" FLUSHING   Patient Measurements: Height: 6\' 1"  (185.4 cm) Weight: 242 lb 15.2 oz (110.2 kg) IBW/kg (Calculated) : 79.9  Vital Signs: Temp: (P) 98.5 F (36.9 C) (06/07 1200) Temp Source: Oral (06/07 0800) BP: 92/75 (06/07 1400) Pulse Rate: 84 (06/07 1400)  Labs: Recent Labs    06/06/17 2237 06/07/17 0430 06/08/17 0359 06/08/17 1345  HGB 9.6* 9.0* 8.1*  --   HCT 29.6* 28.2* 25.5*  --   PLT 139* 125* 120*  --   LABPROT 25.6* 25.6* 22.3* 20.3*  INR 2.36 2.35 1.98 1.75  CREATININE 1.82* 1.46* 1.13  --     Estimated Creatinine Clearance: 88.2 mL/min (by C-G formula based on SCr of 1.13 mg/dL).   Medical History: Past Medical History:  Diagnosis Date  . AICD (automatic cardioverter/defibrillator) present   . Arthritis   . Barretts syndrome   . Carotid artery disease (HCC)    a. Dopp 09/2011: 60-79% RICA, 40-59% LICA.;  b.  Carotid US (7/14):  Bilateral 60-79% => f/u 6 mos  . Cerebrovascular disease   . Chronic systolic heart failure (HCC)   . Coronary artery disease    a. AWMI requiring IABP 2005 s/p Horizon study stent-LAD, staged BMS-Cx, DESx2-RCA. b. Last Last LHC (6/06):  EF 25%, pLAD stent 40-50, after stent 40, dLAD 20, pCFX 20, pOM1 70, pRCA 20, mRCA stents ok.=> med Rx;  c.  Myoview (7/14):  EF 26%, large ant, septal, inf and apical infarct, no ischemia.  Med Rx continued  . Diabetes mellitus   . Dyspnea    with exertion  . Headache   . Heart murmur   . History of kidney stones   . HTN (hypertension)   . Hyperlipidemia    mixed  . ICD (implantable cardiac defibrillator), dual, in situ    St. Jude for severe LVD EF 25% 2/07 explanted 2010. Medtronic Virtuoso II DR Dual-chamber  cardiverter-defibrillator  with pocket revision, Dr. Graciela Husbands  . Ischemic cardiomyopathy    a. Echo (09/27/12): EF 20%, diffuse HK with periapical AK, no LV thrombus noted, restrictive physiology, trivial MR, mild LAE, RVSF mildly reduced, PASP 39  . Pleural effusion 01/05/2016  . Presence of permanent cardiac pacemaker   . PVD (peripheral vascular disease) (HCC)    a. ABI 09/2011 - R normal, L moderate - saw Dr. Kirke Corin - med rx.   Marland Kitchen RIATA ICD Lead--on advisory recall    . Tobacco abuse     Assessment: 62 y/o M presents to the ED with sepsis, hx of LVAD pt on warfarin PTA with lower goal due to hx GIB. INR on admission is 2.36 - down to 1.75 today.   Hgb trending down slightly to 8.1, plt 120. No s/sx of bleeding. Patient undergoing foot amputation today at 1600. Will be starting on rifampin today due to MSSA in setting of LVAD. Okay per teams to start warfarin tonight following procedure.    Goal of Therapy:  INR 2-2.5 (lower goal with hx GIB) Monitor platelets by anticoagulation protocol: Yes   Plan:  Resume warfarin 7.5 mg tonight Monitor daily INR Follow along with team if need to restart heparin given INR trend tomorrow   Monitor for bleeding  Girard Cooter,  PharmD Clinical Pharmacist  Pager: [phone] Phone: [phone] 06/08/2017,2:59 PM

## 2017-06-08 NOTE — Progress Notes (Signed)
ANTICOAGULATION CONSULT NOTE  Pharmacy Consult for Warfarin / Heparin Indication: LVAD  Allergies  Allergen Reactions  . Metformin And Related Nausea And Vomiting    *Only the extended release* (does this)  . Niacin And Related Other (See Comments)    REACTION IS SIDE EFFECT "SEVERE" FLUSHING   Patient Measurements: Height: 6\' 1"  (185.4 cm) Weight: 242 lb 15.2 oz (110.2 kg) IBW/kg (Calculated) : 79.9  Vital Signs: Temp: 97.2 F (36.2 C) (06/07 1730) Temp Source: Oral (06/07 0800) BP: 97/87 (06/07 1900) Pulse Rate: 86 (06/07 1800)  Labs: Recent Labs    06/06/17 2237 06/07/17 0430 06/08/17 0359 06/08/17 1345  HGB 9.6* 9.0* 8.1*  --   HCT 29.6* 28.2* 25.5*  --   PLT 139* 125* 120*  --   LABPROT 25.6* 25.6* 22.3* 20.3*  INR 2.36 2.35 1.98 1.75  CREATININE 1.82* 1.46* 1.13  --     Estimated Creatinine Clearance: 88.2 mL/min (by C-G formula based on SCr of 1.13 mg/dL).   Medical History: Past Medical History:  Diagnosis Date  . AICD (automatic cardioverter/defibrillator) present   . Arthritis   . Barretts syndrome   . Carotid artery disease (HCC)    a. Dopp 09/2011: 60-79% RICA, 40-59% LICA.;  b.  Carotid US (7/14):  Bilateral 60-79% => f/u 6 mos  . Cerebrovascular disease   . Chronic systolic heart failure (HCC)   . Coronary artery disease    a. AWMI requiring IABP 2005 s/p Horizon study stent-LAD, staged BMS-Cx, DESx2-RCA. b. Last Last LHC (6/06):  EF 25%, pLAD stent 40-50, after stent 40, dLAD 20, pCFX 20, pOM1 70, pRCA 20, mRCA stents ok.=> med Rx;  c.  Myoview (7/14):  EF 26%, large ant, septal, inf and apical infarct, no ischemia.  Med Rx continued  . Diabetes mellitus   . Dyspnea    with exertion  . Headache   . Heart murmur   . History of kidney stones   . HTN (hypertension)   . Hyperlipidemia    mixed  . ICD (implantable cardiac defibrillator), dual, in situ    St. Jude for severe LVD EF 25% 2/07 explanted 2010. Medtronic Virtuoso II DR Dual-chamber  cardiverter-defibrillator  with pocket revision, Dr. Graciela Husbands  . Ischemic cardiomyopathy    a. Echo (09/27/12): EF 20%, diffuse HK with periapical AK, no LV thrombus noted, restrictive physiology, trivial MR, mild LAE, RVSF mildly reduced, PASP 39  . Pleural effusion 01/05/2016  . Presence of permanent cardiac pacemaker   . PVD (peripheral vascular disease) (HCC)    a. ABI 09/2011 - R normal, L moderate - saw Dr. Kirke Corin - med rx.   Marland Kitchen RIATA ICD Lead--on advisory recall    . Tobacco abuse     Assessment: 62 y/o M presents to the ED with sepsis, hx of LVAD pt on warfarin PTA with lower goal due to hx GIB. INR on admission is 2.36 - down to 1.75 today.   Hgb trending down slightly to 8.1, plt 120. No s/sx of bleeding. Patient underwent toe amputation today.  OK to restart warfarin and bridge with heparin unitl INR > 1.8  Will be starting on rifampin today due to MSSA in setting of LVAD - may need to increase warfarin dose.  Goal of Therapy:  INR 2-2.5 (lower goal with hx GIB) Monitor platelets by anticoagulation protocol: Yes   Plan:  Resume warfarin 7.5 mg tonight Heparin drip 1100 uts/hr  Monitor daily INR, HL, CBC  Leota Sauers Pharm.D. CPP,  BCPS Clinical Pharmacist [phone] 06/08/2017 8:02 PM

## 2017-06-08 NOTE — Progress Notes (Signed)
VAD Coordinator Procedure Note:   Patient underwent left second toe amputation. Hemodynamics and VAD parameters monitored by myself and anesthesia throughout the procedure. MAPs were obtained with autol BP cuff on left arm and correlated with doppler modified systolic.      Doppler Auto cuff(MAP): Flow: Peak/flow: Power:     Speed:              Pre-procedure: 15:40 pm 90  94/63 (73)  4.5 6.6/3.0  4.3     2700 16:00    109/90 (93)  5.0 6.8/3.4  3.9   Intra op:  16:45    117/92 (100)  5.2 7/4  4.3 17:00    116/90 (98)  5.1 7/3.5  4.4 17:15    118/83 (92)  5.0 6.5/3.5  4.3  Recovery area: 17:30    98/73 (90)  4.9 6.7/3.5  4.3   Patient tolerated the procedure well.   Patient Disposition: [phone]

## 2017-06-08 NOTE — Consult Note (Signed)
CONSULT NOTE: PODIATRY  HPI: [name] a 62 y.o.malesmoker with history of PAD, CAD, and ischemic cardiomyopathy s/p HVAD placement on 05/30/16.  Patient was seen by me on 06/06/2017 for evaluation of ulcer to the second toe right foot.  Cultures were taken patient was placed on antibiotics and scheduled for surgical toe amputation with a referral placed for vascular. He had been feeling generalized malaise for several days.  He went home after his appointment and laid down.  According to his father he was unable to get back up and was poorly responsive. Father called EMS.  When they arrived, his temperature was 103.  He was brought to the ER.  MAP initially low, most recently 80.  Lactate not elevated but creatinine up to 1.8.  WBCs mildly elevated.  LDH stable.  He will answer questions but still with malaise and drowsiness.  CXR clear.  He is in NSR.      Past Medical History:  Diagnosis Date  . AICD (automatic cardioverter/defibrillator) present   . Arthritis   . Barretts syndrome   . Carotid artery disease (HCC)    a. Dopp 09/2011: 60-79% RICA, 40-59% LICA.;  b.  Carotid US (7/14):  Bilateral 60-79% => f/u 6 mos  . Cerebrovascular disease   . Chronic systolic heart failure (HCC)   . Coronary artery disease    a. AWMI requiring IABP 2005 s/p Horizon study stent-LAD, staged BMS-Cx, DESx2-RCA. b. Last Last LHC (6/06):  EF 25%, pLAD stent 40-50, after stent 40, dLAD 20, pCFX 20, pOM1 70, pRCA 20, mRCA stents ok.=> med Rx;  c.  Myoview (7/14):  EF 26%, large ant, septal, inf and apical infarct, no ischemia.  Med Rx continued  . Diabetes mellitus   . Dyspnea    with exertion  . Headache   . Heart murmur   . History of kidney stones   . HTN (hypertension)   . Hyperlipidemia    mixed  . ICD (implantable cardiac defibrillator), dual, in situ    St. Jude for severe LVD EF 25% 2/07 explanted 2010. Medtronic Virtuoso II DR Dual-chamber cardiverter-defibrillator  with pocket revision,  Dr. Graciela Husbands  . Ischemic cardiomyopathy    a. Echo (09/27/12): EF 20%, diffuse HK with periapical AK, no LV thrombus noted, restrictive physiology, trivial MR, mild LAE, RVSF mildly reduced, PASP 39  . Pleural effusion 01/05/2016  . Presence of permanent cardiac pacemaker   . PVD (peripheral vascular disease) (HCC)    a. ABI 09/2011 - R normal, L moderate - saw Dr. Kirke Corin - med rx.   Marland Kitchen RIATA ICD Lead--on advisory recall    . Tobacco abuse      Physical Exam: Patient overall in good spirits.  Osteomyelitis of the second toe right foot as evident on x-rays taken in the office.  There is some localized cellulitis as well.  No significant changes since last office visit on 06/06/2017.  Assessment: 1.  Osteomyelitis second toe right foot. 2.  Cellulitis right foot   Plan of Care:  1. Patient evaluated this morning.  Patient placed n.p.o. and scheduled for surgery after 4 PM today.  Surgery will consist of second toe amputation right foot with incision and drainage. 2.  Continue medical management as per admitting physician.  Continue IV antibiotics vancomycin/Zosyn.  Vascular imaging has already been performed on 06/07/2017 inpatient. 3.  Preoperative orders placed today.  Patient okay to continue warfarin if needed, since it is a minimal surgery isolated toe  amputation the patient has known peripheral vascular disease, blood thinner should not be much of a complication. 4.  I will continue to follow while inpatient.  Thank you for the consultation      Felecia ShellingBrent M. Evans, DPM [facility]  Dr. Felecia ShellingBrent M. Evans, DPM    [address]                                        [place], [place] [address]                Office [phone]  Fax [phone]

## 2017-06-08 NOTE — Transfer of Care (Signed)
Immediate Anesthesia Transfer of Care Note  Patient: [name]  Procedure(s) Performed: AMPUTATION TOE (Right Toe)  Patient Location: PACU  Anesthesia Type:MAC  Level of Consciousness: awake, alert , oriented and patient cooperative  Airway & Oxygen Therapy: Patient Spontanous Breathing and Patient connected to nasal cannula oxygen  Post-op Assessment: Report given to RN, Post -op Vital signs reviewed and stable and Patient moving all extremities  Post vital signs: Reviewed and stable  Last Vitals:  Vitals Value Taken Time  BP    Temp    Pulse    Resp    SpO2      Last Pain:  Vitals:   06/08/17 1117  TempSrc:   PainSc: Asleep      Patients Stated Pain Goal: 2 (96/78/93 8101)  Complications: No apparent anesthesia complications

## 2017-06-09 ENCOUNTER — Encounter (HOSPITAL_COMMUNITY): Payer: Self-pay | Admitting: Podiatry

## 2017-06-09 DIAGNOSIS — I5022 Chronic systolic (congestive) heart failure: Secondary | ICD-10-CM

## 2017-06-09 LAB — GLUCOSE, CAPILLARY
GLUCOSE-CAPILLARY: 139 mg/dL — AB (ref 65–99)
GLUCOSE-CAPILLARY: 260 mg/dL — AB (ref 65–99)
GLUCOSE-CAPILLARY: 123 mg/dL — AB (ref 65–99)
Glucose-Capillary: 144 mg/dL — ABNORMAL HIGH (ref 65–99)
Glucose-Capillary: 221 mg/dL — ABNORMAL HIGH (ref 65–99)

## 2017-06-09 LAB — WOUND CULTURE
MICRO NUMBER:: 90677733
SPECIMEN QUALITY:: ADEQUATE

## 2017-06-09 LAB — BASIC METABOLIC PANEL
ANION GAP: 7 (ref 5–15)
BUN: 10 mg/dL (ref 6–20)
CHLORIDE: 100 mmol/L — AB (ref 101–111)
CO2: 26 mmol/L (ref 22–32)
Calcium: 8.3 mg/dL — ABNORMAL LOW (ref 8.9–10.3)
Creatinine, Ser: 1.07 mg/dL (ref 0.61–1.24)
GFR calc Af Amer: >60 mL/min (ref >60)
GFR calc non Af Amer: >60 mL/min (ref >60)
GLUCOSE: 141 mg/dL — AB (ref 65–99)
POTASSIUM: 4.2 mmol/L (ref 3.5–5.1)
Sodium: 133 mmol/L — ABNORMAL LOW (ref 135–145)

## 2017-06-09 LAB — CULTURE, BLOOD (ROUTINE X 2)
SPECIAL REQUESTS: ADEQUATE
Special Requests: ADEQUATE

## 2017-06-09 LAB — CBC
HCT: 26.3 % — ABNORMAL LOW (ref 39.0–52.0)
HEMOGLOBIN: 8.3 g/dL — AB (ref 13.0–17.0)
MCH: 27.4 pg (ref 26.0–34.0)
MCHC: 31.6 g/dL (ref 30.0–36.0)
MCV: 86.8 fL (ref 78.0–100.0)
Platelets: 118 10*3/uL — ABNORMAL LOW (ref 150–400)
RBC: 3.03 MIL/uL — AB (ref 4.22–5.81)
RDW: 17.5 % — AB (ref 11.5–15.5)
WBC: 4.6 10*3/uL (ref 4.0–10.5)

## 2017-06-09 LAB — LACTATE DEHYDROGENASE: LDH: 168 U/L (ref 98–192)

## 2017-06-09 LAB — PROTIME-INR
INR: 1.83
Prothrombin Time: 21 seconds — ABNORMAL HIGH (ref 11.4–15.2)

## 2017-06-09 LAB — HEPARIN LEVEL (UNFRACTIONATED)

## 2017-06-09 MED ORDER — WARFARIN SODIUM 7.5 MG PO TABS
7.5000 mg | ORAL_TABLET | Freq: Once | ORAL | Status: AC
Start: 2017-06-09 — End: 2017-06-09
  Administered 2017-06-09: 7.5 mg via ORAL
  Filled 2017-06-09: qty 1

## 2017-06-09 MED ORDER — SPIRONOLACTONE 12.5 MG HALF TABLET
12.5000 mg | ORAL_TABLET | Freq: Every day | ORAL | Status: DC
Start: 2017-06-09 — End: 2017-06-12
  Administered 2017-06-09 – 2017-06-12 (×4): 12.5 mg via ORAL
  Filled 2017-06-09 (×4): qty 1

## 2017-06-09 NOTE — Progress Notes (Signed)
ANTICOAGULATION CONSULT NOTE  Pharmacy Consult for Warfarin Indication: LVAD  Allergies  Allergen Reactions  . Metformin And Related Nausea And Vomiting    *Only the extended release* (does this)  . Niacin And Related Other (See Comments)    REACTION IS SIDE EFFECT "SEVERE" FLUSHING   Patient Measurements: Height: 6\' 1"  (185.4 cm) Weight: 244 lb 7.8 oz (110.9 kg) IBW/kg (Calculated) : 79.9  Vital Signs: Temp: 98.4 F (36.9 C) (06/08 0430) Temp Source: Oral (06/08 0430) BP: 102/84 (06/08 0800) Pulse Rate: 87 (06/08 0700)  Labs: Recent Labs    06/08/17 0359 06/08/17 1345 06/08/17 2037 06/09/17 0353  HGB 8.1*  --  8.4* 8.3*  HCT 25.5*  --  26.8* 26.3*  PLT 120*  --  119* 118*  LABPROT 22.3* 20.3* 19.2* 21.0*  INR 1.98 1.75 1.63 1.83  HEPARINUNFRC  --   --   --  <0.10*  CREATININE 1.13  --  1.06 1.07    Estimated Creatinine Clearance: 93.5 mL/min (by C-G formula based on SCr of 1.07 mg/dL).    Assessment: 62 y/o M presents to the ED with sepsis, hx of LVAD pt on warfarin PTA with lower goal due to hx GIB. INR rising slightly today to 1.83.  Heparin level undetectable on   Hgb trending down slightly to 8.3, plt 118. No s/sx of bleeding. S/p amputation yesterday. Also on rifampin due to MSSA in setting of LVAD.   Goal of Therapy:  INR 2-2.5 (lower goal with hx GIB) Monitor platelets by anticoagulation protocol: Yes   Plan:  Repeat warfarin 7.5 mg tonight Monitor daily INR Follow along with team if need to restart heparin given INR trend tomorrow (resume if < 1.8) Monitor for bleeding  Jenetta DownerJessica Mung Rinker, Pharm D, BCPS, [facility] CenterBCCP Clinical Pharmacist Pager [phone]  06/09/2017 8:53 AM

## 2017-06-10 LAB — BASIC METABOLIC PANEL
ANION GAP: 9 (ref 5–15)
BUN: 9 mg/dL (ref 6–20)
CALCIUM: 8.4 mg/dL — AB (ref 8.9–10.3)
CO2: 26 mmol/L (ref 22–32)
CREATININE: 1.09 mg/dL (ref 0.61–1.24)
Chloride: 99 mmol/L — ABNORMAL LOW (ref 101–111)
GFR calc non Af Amer: >60 mL/min (ref >60)
Glucose, Bld: 107 mg/dL — ABNORMAL HIGH (ref 65–99)
Potassium: 4.6 mmol/L (ref 3.5–5.1)
Sodium: 134 mmol/L — ABNORMAL LOW (ref 135–145)

## 2017-06-10 LAB — PROTIME-INR
INR: 2.73
Prothrombin Time: 28.7 seconds — ABNORMAL HIGH (ref 11.4–15.2)

## 2017-06-10 LAB — CBC
HCT: 26.4 % — ABNORMAL LOW (ref 39.0–52.0)
Hemoglobin: 8.3 g/dL — ABNORMAL LOW (ref 13.0–17.0)
MCH: 27 pg (ref 26.0–34.0)
MCHC: 31.4 g/dL (ref 30.0–36.0)
MCV: 86 fL (ref 78.0–100.0)
PLATELETS: 109 10*3/uL — AB (ref 150–400)
RBC: 3.07 MIL/uL — ABNORMAL LOW (ref 4.22–5.81)
RDW: 17.3 % — AB (ref 11.5–15.5)
WBC: 3.7 10*3/uL — AB (ref 4.0–10.5)

## 2017-06-10 LAB — GLUCOSE, CAPILLARY
GLUCOSE-CAPILLARY: 101 mg/dL — AB (ref 65–99)
GLUCOSE-CAPILLARY: 174 mg/dL — AB (ref 65–99)
GLUCOSE-CAPILLARY: 196 mg/dL — AB (ref 65–99)
Glucose-Capillary: 194 mg/dL — ABNORMAL HIGH (ref 65–99)

## 2017-06-10 LAB — LACTATE DEHYDROGENASE: LDH: 172 U/L (ref 98–192)

## 2017-06-10 MED ORDER — WARFARIN SODIUM 2.5 MG PO TABS
2.5000 mg | ORAL_TABLET | Freq: Once | ORAL | Status: AC
  Administered 2017-06-10: 2.5 mg via ORAL
  Filled 2017-06-10: qty 1

## 2017-06-10 MED ORDER — TORSEMIDE 20 MG PO TABS
40.0000 mg | ORAL_TABLET | Freq: Every day | ORAL | Status: DC
  Administered 2017-06-11 – 2017-06-12 (×2): 40 mg via ORAL
  Filled 2017-06-10 (×2): qty 2

## 2017-06-10 MED ORDER — POTASSIUM CHLORIDE CRYS ER 20 MEQ PO TBCR
20.0000 meq | EXTENDED_RELEASE_TABLET | Freq: Every day | ORAL | Status: DC
  Administered 2017-06-11 – 2017-06-12 (×2): 20 meq via ORAL
  Filled 2017-06-10 (×2): qty 1

## 2017-06-10 NOTE — Op Note (Signed)
OPERATIVE REPORT Patient name: [name] MRN: [id number] DOB: [birth year]  DOS: June 08, 2017  Preop Dx: Osteomyelitis second toe right foot.  Abscess with cellulitis right foot Postop Dx: same  Procedure:  1.  Amputation second toe right foot 2.  Incision and drainage right foot  Surgeon: Felecia Shelling DPM  Anesthesia: Popliteal block provided preoperatively by anesthesia right lower extremity  Hemostasis: Ankle tourniquet inflated to a pressure of without Esmarch exsanguination   EBL: 10 mL Materials: None Injectables: None Pathology: Deep wound culture sent to pathology for culture and sensitivity, anaerobic aerobic and Gram stain  Condition: The patient tolerated the procedure and anesthesia well. No complications noted or reported   Justification for procedure: The patient is a 62 y.o. male who presents today for surgical correction of osteomyelitis to the second toe with cellulitis and abscess. All conservative modalities of been unsuccessful in providing any sort of satisfactory alleviation of symptoms with the patient. The patient was told benefits as well as possible side effects of the surgery. The patient consented for surgical correction. The patient consent form was reviewed. All patient questions were answered. No guarantees were expressed or implied. The patient and the surgeon boson the patient consent form with the witness present and placed in the patient's chart.   Procedure in Detail: The patient was brought to the operating room, placed in the operating table in the supine position at which time an aseptic scrub and drape were performed about the patient's respective lower extremity after anesthesia was induced as described above. Attention was then directed to the surgical area where procedure number one commenced.  Procedure #1: Amputation second toe right foot  A  racquet type incision was planned and made overlying the second MTPJ of the right  foot.  The incision was carried down the level of joint capsule with care taken to cut clamp ligate to retract well small neurovascular structures traversing the incision site.  Capsulotomy was performed and all soft tissue structures surrounding the MTPJ were sharply dissected using a surgical #15 blade.  The toe was distracted distally and removed in toto and placed in sterile specimen container.  Extensor and flexor tendons were distracted distal and cut as far proximal as could be visualized.  Intraoperative evaluation indicated that the head of the second metatarsal did not appear very healthy and was somewhat necrotic.  Intraoperative decision to perform a metatarsal head resection was performed.  Osteotomy was performed at the surgical phalangeal neck of the second metatarsal.  The head of the second metatarsal was removed and placed in sterile specimen container and sent to pathology for gross and microscopic exam.  Procedure #2: Incision and drainage right foot Soft tissue structures within the incision site were then identified and soft tissue expiration was performed.  All compartments of the foot were bluntly dissected to ensure any purulent drainage was not trapped within the soft tissue structures.  Incision and drainage was performed  followed by copious irrigation.  All necrotic nonviable tissue was sharply dissected away.  At this time deep wound cultures were taken and sent to pathology for culture and sensitivity  Routine primary closure was obtained using 3-0 Prolene suture.  Dry sterile compressive dressings were then applied to all previously mentioned incision sites about the patient's lower extremity. The tourniquet which was used for hemostasis was deflated. All normal neurovascular responses including pink color and warmth returned all the digits of patient's lower extremity.  The patient was  then transferred from the operating room to the recovery room having tolerated the  procedure and anesthesia well. All vital signs are stable. After a brief stay in the recovery room the patient was readmitted to his inpatient room with adequate prescriptions for analgesia. Verbal as well as written instructions were provided for the patient regarding wound care. The patient is to keep the dressings clean dry and intact until they are to follow surgeon Dr. Gala LewandowskyBrent Kierre Hintz in the office upon discharge.   Felecia ShellingBrent M. Thersa Mohiuddin, DPM [facility]  Dr. Felecia ShellingBrent M. Lakea Mittelman, DPM    [address]                                        [place], [place] [address]                Office [phone]  Fax [phone]

## 2017-06-10 NOTE — Progress Notes (Signed)
ANTICOAGULATION CONSULT NOTE  Pharmacy Consult for Warfarin Indication: LVAD  Allergies  Allergen Reactions  . Metformin And Related Nausea And Vomiting    *Only the extended release* (does this)  . Niacin And Related Other (See Comments)    REACTION IS SIDE EFFECT "SEVERE" FLUSHING   Patient Measurements: Height: 6\' 1"  (185.4 cm) Weight: 237 lb 4.8 oz (107.6 kg) IBW/kg (Calculated) : 79.9  Vital Signs: Temp: 98.5 F (36.9 C) (06/09 0748) Temp Source: Tympanic (06/09 0748) BP: 114/88 (06/09 0748) Pulse Rate: 87 (06/09 0748)  Labs: Recent Labs    06/08/17 2037 06/09/17 0353 06/10/17 0147  HGB 8.4* 8.3* 8.3*  HCT 26.8* 26.3* 26.4*  PLT 119* 118* 109*  LABPROT 19.2* 21.0* 28.7*  INR 1.63 1.83 2.73  HEPARINUNFRC  --  <0.10*  --   CREATININE 1.06 1.07 1.09    Estimated Creatinine Clearance: 90.4 mL/min (by C-G formula based on SCr of 1.09 mg/dL).  Assessment: 62 y/o M presents to the ED with sepsis, hx of LVAD pt on warfarin PTA with lower goal due to hx GIB. INR rising slightly today to 1.83.    PTA warfarin dose was 5mg  daily.   Hgb trending down slightly to 8.3. No s/sx of bleeding. S/p toe amputation 6/7. Also on rifampin due to MSSA in setting of LVAD which will increase patient's requirements of warfarin, this can be delayed 1-2 weeks before full effects are seen. INR will need to be managed closely as outpatient.    Goal of Therapy:  INR 2-2.5 (lower goal with hx GIB) Monitor platelets by anticoagulation protocol: Yes   Plan:  Warfarin  2.5 mg tonight Monitor daily INR  Sheppard CoilFrank Wilson PharmD., BCPS Clinical Pharmacist 06/10/2017 10:22 AM

## 2017-06-10 NOTE — Progress Notes (Addendum)
Advanced Heart Failure VAD Team Note  PCP-Cardiologist: No primary care provider on file.   Subjective:    Admitted with sepsis/osteomyelitis. BCx 06/06/17 2/2 MSSA bacteremia. ID following. On cefazolin. Recommend TEE  Underwent toe amputation on 6/7  Feels much better today. Afebrile. No orthopnea or PND. Foot hurts a little but not bad.   Wight down 7 pound overnight with IV lasix.   INR 2.7. No bleeding. HCT stable.     HVAD parameters RPM 2700 Flow 4.8 Peak 6.0 Trough 3.9 Suction On. HCT 26.4% this am.   Objective:    Vital Signs:   Temp:  [97.5 F (36.4 C)-98.8 F (37.1 C)] 97.6 F (36.4 C) (06/09 1132) Pulse Rate:  [45-87] 54 (06/09 1132) Resp:  [0-30] 13 (06/09 1132) BP: (89-114)/(56-96) 93/56 (06/09 1132) SpO2:  [95 %-99 %] 98 % (06/09 1132) Weight:  [107.6 kg (237 lb 4.8 oz)] 107.6 kg (237 lb 4.8 oz) (06/09 0417) Last BM Date: 06/08/17 Mean arterial Pressure 80s  Intake/Output:   Intake/Output Summary (Last 24 hours) at 06/10/2017 1252 Last data filed at 06/10/2017 1135 Gross per 24 hour  Intake 992.5 ml  Output 525 ml  Net 467.5 ml    Physical Exam   General:  Lying in bed NAD.  HEENT: normal  Neck: supple. RIG TLJ. Hard to see JVP Carotids 2+ bilat; no bruits. No lymphadenopathy or thryomegaly appreciated. Cor: LVAD hum.  Lungs: Clear. Abdomen: obese soft, nontender, non-distended. No hepatosplenomegaly. No bruits or masses. Good bowel sounds. Driveline site clean. Anchor in place.  Extremities: no cyanosis, clubbing, rash. Warm chronic venous stasis changes. Tr edema  R foot wrapped   Neuro: alert & oriented x 3. No focal deficits. Moves all 4 without problem    Telemetry   NSR 60s + PVCs, personally reviewed.   EKG    No new tracings.    Labs   Basic Metabolic Panel: Recent Labs  Lab 06/07/17 0430 06/08/17 0359 06/08/17 2037 06/09/17 0353 06/10/17 0147  NA 133* 132* 135 133* 134*  K 3.5 3.7 4.0 4.2 4.6  CL 101 100* 100* 100* 99*    CO2 23 26 26 26 26   GLUCOSE 186* 148* 144* 141* 107*  BUN 15 13 9 10 9   CREATININE 1.46* 1.13 1.06 1.07 1.09  CALCIUM 8.1* 8.2* 8.3* 8.3* 8.4*    Liver Function Tests: Recent Labs  Lab 06/06/17 2237  AST 32  ALT 25  ALKPHOS 61  BILITOT 1.3*  PROT 7.3  ALBUMIN 2.9*   No results for input(s): LIPASE, AMYLASE in the last 168 hours. No results for input(s): AMMONIA in the last 168 hours.  CBC: Recent Labs  Lab 06/06/17 2237 06/07/17 0430 06/08/17 0359 06/08/17 2037 06/09/17 0353 06/10/17 0147  WBC 11.5* 9.1 5.9 5.2 4.6 3.7*  NEUTROABS 10.6*  --   --   --   --   --   HGB 9.6* 9.0* 8.1* 8.4* 8.3* 8.3*  HCT 29.6* 28.2* 25.5* 26.8* 26.3* 26.4*  MCV 84.8 85.5 84.4 87.0 86.8 86.0  PLT 139* 125* 120* 119* 118* 109*   INR: Recent Labs  Lab 06/08/17 0359 06/08/17 1345 06/08/17 2037 06/09/17 0353 06/10/17 0147  INR 1.98 1.75 1.63 1.83 2.73   Other results:  EKG:   Imaging   Dg Foot Complete Right  Result Date: 06/08/2017 CLINICAL DATA:  Postop. EXAM: RIGHT FOOT COMPLETE - 3+ VIEW COMPARISON:  06/06/2017 FINDINGS: Evidence of interval transmetatarsal amputation at the head of the second metatarsal.  Remainder the exam is unchanged. IMPRESSION: Interval transmetatarsal amputation at the level of the head of the second metatarsal. Electronically Signed   By: Elberta Fortis M.D.   On: 06/08/2017 19:42     Medications:     Scheduled Medications: . atorvastatin  80 mg Oral q1800  . danazol  100 mg Oral BID  . ezetimibe  10 mg Oral Daily  . feeding supplement (ENSURE ENLIVE)  237 mL Oral BID BM  . ferrous sulfate  325 mg Oral BID WC  . furosemide  40 mg Intravenous BID  . insulin aspart  0-15 Units Subcutaneous TID WC  . insulin aspart  0-5 Units Subcutaneous QHS  . insulin aspart protamine- aspart  12 Units Subcutaneous BID WC  . mouth rinse  15 mL Mouth Rinse BID  . metoCLOPramide  5 mg Oral TID AC  . pantoprazole  40 mg Oral BID  . [START ON 06/11/2017]  potassium chloride  20 mEq Oral Daily  . rifampin  300 mg Oral Q12H  . sertraline  25 mg Oral Daily  . sildenafil  20 mg Oral TID  . sodium chloride flush  10-40 mL Intracatheter Q12H  . sodium chloride flush  3 mL Intravenous Q12H  . spironolactone  12.5 mg Oral Daily  . warfarin  2.5 mg Oral ONCE-1800  . Warfarin - Pharmacist Dosing Inpatient   Does not apply q1800    Infusions: . sodium chloride Stopped (06/08/17 1848)  . sodium chloride 10 mL/hr at 06/10/17 0615  . sodium chloride Stopped (06/08/17 1847)  .  ceFAZolin (ANCEF) IV 2 g (06/10/17 0604)  . lactated ringers Stopped (06/10/17 4098)    PRN Medications: sodium chloride, sodium chloride, acetaminophen **OR** acetaminophen, acetaminophen, ondansetron (ZOFRAN) IV, oxyCODONE-acetaminophen, sodium chloride, sodium chloride flush, sodium chloride flush, traMADol, white petrolatum   Patient Profile   [name] is a 62 y.o. malesmoker with history of PAD, CAD, and ischemic cardiomyopathy s/p HVAD placement on 05/30/16.   Admitted 06/06/17 with septic shock with osteomyelitis in R great toe.   Assessment/Plan:    1. MSSA sepsis/septic shock due to R great toe osteo - Resolved, MAPs responded well to fluid bolus.   - BCx 06/06/17 2/2 MSSA. ID following. Now on Ancef and rifampin. Will need very close monitoring of INR - Afebrile - s/p right great toe amputation 6/7 - Plan TEE tomorrow. Order placed  2. Chronic systolic JXB:JYNWGNFA cardiomyopathy - Echo in 3/18 with EF 20-25%. St Jude ICD.  - s/p HVAD placement. Lavare cycle is on - VAD interrogated personally. Parameters stable. - LDH stable at 168 - Losartan on hold with soft BP. Restarted spiro 12.5 - Weight down 7 pounds on IV lasix. Switch back to home regimen.  - Back on warfarin. INR 2.7  Off heparin. With rifampin will need very close monitoring of INR 3. CAD:  - 3 vessel CAD, s/p stent to proximal LAD and PTCA to subtotally occluded large OM1 in 1/18.  -  No s/s ischemia - Continue atorvastatin 80 mg daily.  4. PAD: - Stable claudication, notes with moderate walking on left. Left fem-pop bypass occluded. Now with right 2nd toe ulceration and suspected osteomyelitis.  - s/p amputation on R great toe on 6/7 - Will place PT/OT consult - RLE Duplex 06/07/17 with patent gradt from previous fem-bop bypass. Unreliable waveforms with LVAD - ABIs 06/07/17 with non-compressible L/RLE arteries. Unable to obtain TBIs due to VAD waveforms.  5. Pulmonary arterial HTN:  -  Continue Revatio 20 mg TID as pressure tolerated.  - No change to current plan.   6. Smoking:  - Encouraged complete cessation. No change 7. Upper GI bleeding: 7/18, small bowel AVMs. ASA was decreased to 81 daily. Warfarin INR goal is now 2-2.5. He started monthly octreotide injections but had profuse diarrhea with octreotide after 1st injection and after 2nd injection with decreased dose. He is tolerating danazol without problems. 12/18 had more melena in setting of high INR =>due to vigorous epistaxis versus AVM bleeding. ASA was stopped.  - No bleeding currently. Hgb stable 8.3 - He stay off ASA 81 and will continue INR goal 2-2.5 for now. - Continue danazol.  8. L1 Burst Fracture:  - Stable. Once ambulatory needs to wear brace when walking.  9. Diabetes:  - Cover with home NPH and sliding scale. No change.    Length of Stay: 4  Dalton Meresaniel Rylan Kaufmann, MD  12:52 PM   VAD Team --- VAD ISSUES ONLY--- Pager [phone] (7am - 7am)  Advanced Heart Failure Team  Pager [phone] (M-F; 7a - 4p)

## 2017-06-10 NOTE — Brief Op Note (Signed)
06/08/2017  3:36 PM  PATIENT:  [name]  62 y.o. male  PRE-OPERATIVE DIAGNOSIS:  infection  POST-OPERATIVE DIAGNOSIS:  infection  PROCEDURE:  Procedure(s): AMPUTATION TOE (Right)  SURGEON:  Surgeon(s) and Role:    Felecia Shelling* Jabin Tapp M, DPM - Primary  PHYSICIAN ASSISTANT:   ASSISTANTS: none   ANESTHESIA:   local  EBL:  minimal  BLOOD ADMINISTERED:none  DRAINS: none   LOCAL MEDICATIONS USED:  MARCAINE    and LIDOCAINE   SPECIMEN:  No Specimen  DISPOSITION OF SPECIMEN:  N/A  COUNTS:  YES  TOURNIQUET:   Total Tourniquet Time Documented: Calf (Right) - 23 minutes Total: Calf (Right) - 23 minutes   DICTATION: .Reubin Milanragon Dictation  PLAN OF CARE: Admit to inpatient   PATIENT DISPOSITION:  PACU - hemodynamically stable.   Delay start of Pharmacological VTE agent (>24hrs) due to surgical blood loss or risk of bleeding: not applicable

## 2017-06-10 NOTE — Progress Notes (Signed)
HPI: Patient status post 2nd toe right foot with incision and drainage. DOS 06/08/17. Patient overall feeling well with no complaints. Patient seen at bedside with dressings CDI and do no exhibit strikethrough.   Past Medical History:  Diagnosis Date  . AICD (automatic cardioverter/defibrillator) present   . Arthritis   . Barretts syndrome   . Carotid artery disease (HCC)    a. Dopp 09/2011: 60-79% RICA, 40-59% LICA.;  b.  Carotid US (7/14):  Bilateral 60-79% => f/u 6 mos  . Cerebrovascular disease   . Chronic systolic heart failure (HCC)   . Coronary artery disease    a. AWMI requiring IABP 2005 s/p Horizon study stent-LAD, staged BMS-Cx, DESx2-RCA. b. Last Last LHC (6/06):  EF 25%, pLAD stent 40-50, after stent 40, dLAD 20, pCFX 20, pOM1 70, pRCA 20, mRCA stents ok.=> med Rx;  c.  Myoview (7/14):  EF 26%, large ant, septal, inf and apical infarct, no ischemia.  Med Rx continued  . Diabetes mellitus   . Dyspnea    with exertion  . Headache   . Heart murmur   . History of kidney stones   . HTN (hypertension)   . Hyperlipidemia    mixed  . ICD (implantable cardiac defibrillator), dual, in situ    St. Jude for severe LVD EF 25% 2/07 explanted 2010. Medtronic Virtuoso II DR Dual-chamber cardiverter-defibrillator  with pocket revision, Dr. Graciela Husbands  . Ischemic cardiomyopathy    a. Echo (09/27/12): EF 20%, diffuse HK with periapical AK, no LV thrombus noted, restrictive physiology, trivial MR, mild LAE, RVSF mildly reduced, PASP 39  . Pleural effusion 01/05/2016  . Presence of permanent cardiac pacemaker   . PVD (peripheral vascular disease) (HCC)    a. ABI 09/2011 - R normal, L moderate - saw Dr. Kirke Corin - med rx.   Marland Kitchen RIATA ICD Lead--on advisory recall    . Tobacco abuse    CBC Latest Ref Rng & Units 06/10/2017 06/09/2017 06/08/2017  WBC 4.0 - 10.5 K/uL 3.7(L) 4.6 5.2  Hemoglobin 13.0 - 17.0 g/dL 8.3(L) 8.3(L) 8.4(L)  Hematocrit 39.0 - 52.0 % 26.4(L) 26.3(L) 26.8(L)  Platelets 150 - 400 K/uL  109(L) 118(L) 119(L)   Objective:   Subjective:  Patient presents today status post 2nd toe amputation with incision and drainage right foot. DOS: 06/08/17   Past Medical History:  Diagnosis Date  . AICD (automatic cardioverter/defibrillator) present   . Arthritis   . Barretts syndrome   . Carotid artery disease (HCC)    a. Dopp 09/2011: 60-79% RICA, 40-59% LICA.;  b.  Carotid US (7/14):  Bilateral 60-79% => f/u 6 mos  . Cerebrovascular disease   . Chronic systolic heart failure (HCC)   . Coronary artery disease    a. AWMI requiring IABP 2005 s/p Horizon study stent-LAD, staged BMS-Cx, DESx2-RCA. b. Last Last LHC (6/06):  EF 25%, pLAD stent 40-50, after stent 40, dLAD 20, pCFX 20, pOM1 70, pRCA 20, mRCA stents ok.=> med Rx;  c.  Myoview (7/14):  EF 26%, large ant, septal, inf and apical infarct, no ischemia.  Med Rx continued  . Diabetes mellitus   . Dyspnea    with exertion  . Headache   . Heart murmur   . History of kidney stones   . HTN (hypertension)   . Hyperlipidemia    mixed  . ICD (implantable cardiac defibrillator), dual, in situ    St. Jude for severe LVD EF 25% 2/07 explanted 2010. Medtronic Virtuoso II DR  Dual-chamber cardiverter-defibrillator  with pocket revision, Dr. Graciela HusbandsKlein  . Ischemic cardiomyopathy    a. Echo (09/27/12): EF 20%, diffuse HK with periapical AK, no LV thrombus noted, restrictive physiology, trivial MR, mild LAE, RVSF mildly reduced, PASP 39  . Pleural effusion 01/05/2016  . Presence of permanent cardiac pacemaker   . PVD (peripheral vascular disease) (HCC)    a. ABI 09/2011 - R normal, L moderate - saw Dr. Kirke CorinArida - med rx.   Marland Kitchen. RIATA ICD Lead--on advisory recall    . Tobacco abuse      Objective: Neurovascular status intact. Patient with known PVD.  Skin incisions appear to be well coapted with sutures intact. No sign of infectious process noted. No dehiscence. No active bleeding noted.   Assessment: 1. s/p 2nd toe amputation right foot. DOS:  06/08/17   Plan of Care:  1. Patient was evaluated.  2. Dressings changed today. Keep dressings CDI until dishcarged and follow-up in office. 3. From a surgical/podiatry standpoint patient is stable and okay to be discharged. Podiatry will sign off.  4. Keep dressings clean, dry, and intact until follow-up in office within 1 week of discharge. 5. Patient okay to be weight bearing as tolerated in a postoperative shoe.  6. Post discharge antibiotics recommended pending cultures taken in OR.  7. Follow-up in office within 1 week discharge.   Felecia ShellingBrent M. Evans, DPM [facility]  Dr. Felecia ShellingBrent M. Evans, DPM    [address]                                        [place], [place] [address]                Office [phone]) [phone]  Fax [phone]      Radiographic Exam:  Normal osseous mineralization. Joint spaces preserved. No fracture/dislocation/boney destruction.    Assessment: 1.    Plan of Care:  1. Patient evaluated. X-Rays reviewed.  2.       Felecia ShellingBrent M. Evans, DPM [facility]  Dr. Felecia ShellingBrent M. Evans, DPM    [address]                                        [place], [place] [address]                Office [phone]) [phone]  Fax [phone]

## 2017-06-10 NOTE — Progress Notes (Addendum)
Expand All Collapse All     Advanced Heart Failure VAD Team Note  PCP-Cardiologist: No primary care provider on file.   Subjective:    BCx 06/06/17 2/2 MSSA bacteremia. ID following. Recommend possible TEE.   Tmax 100.4. WBC 5.9 this am. Hgb 8.1   Cr 1.82 -> 1.46 -> 1.13 this am. K 3.7. CVP 13-14.   Feeling OK this am. Feels much better than prior to admission.   HVAD parameters RPM 2700 Peak 5.0 Trough 2.1 Suction On. HCT 25.5% this am.   Objective:    Vital Signs:                 Temp:  [98.5 F (36.9 C)-100.4 F (38 C)] 98.5 F (36.9 C) (06/07 0400) Pulse Rate:  [45-111] 79 (06/07 0700) Resp:  [10-32] 22 (06/07 0700) BP: (84-125)/(64-97) 89/75 (06/07 0700) SpO2:  [92 %-100 %] 97 % (06/07 0700) Weight:  [242 lb 15.2 oz (110.2 kg)] 242 lb 15.2 oz (110.2 kg) (06/07 0500) Last BM Date: 06/07/17 Mean arterial Pressure 84  Intake/Output:             Intake/Output Summary (Last 24 hours) at 06/08/2017 0753 Last data filed at 06/08/2017 0600    Gross per 24 hour  Intake 1940 ml  Output 1849 ml  Net 91 ml               Physical Exam    CVP 13-14  GENERAL: Fatigued appearing. NAD.  HEENT: Normal. NECK: Supple, JVP elevated. Carotids OK.  CARDIAC:  Mechanical heart sounds with LVAD hum present.  LUNGS:  CTAB, normal effort.  ABDOMEN:  NT, ND, no HSM. No bruits or masses. +BS  LVAD exit site: Well-healed and incorporated. Dressing dry and intact. No erythema or drainage. Stabilization device present and accurately applied. Driveline dressing changed daily per sterile technique. EXTREMITIES:  Warm and dry. No cyanosis, clubbing, or rash. Trace to 1+ edema. R second toe with round.  NEUROLOGIC:  Alert & oriented x 3. Cranial nerves grossly intact. Moves all 4 extremities w/o difficulty. Affect pleasant     Telemetry   NSR 80-90s, personally reviewed.   EKG    No new tracings.    Labs   Basic Metabolic Panel: LastLabs       Recent Labs  Lab  06/06/17 2237 06/07/17 0430 06/08/17 0359  NA 129* 133* 132*  K 4.2 3.5 3.7  CL 95* 101 100*  CO2 22 23 26   GLUCOSE 202* 186* 148*  BUN 15 15 13   CREATININE 1.82* 1.46* 1.13  CALCIUM 8.3* 8.1* 8.2*      Liver Function Tests: LastLabs  Recent Labs  Lab 06/06/17 2237  AST 32  ALT 25  ALKPHOS 61  BILITOT 1.3*  PROT 7.3  ALBUMIN 2.9*     LastLabs  No results for input(s): LIPASE, AMYLASE in the last 168 hours.   LastLabs  No results for input(s): AMMONIA in the last 168 hours.    CBC: LastLabs       Recent Labs  Lab 06/06/17 2237 06/07/17 0430 06/08/17 0359  WBC 11.5* 9.1 5.9  NEUTROABS 10.6*  --   --   HGB 9.6* 9.0* 8.1*  HCT 29.6* 28.2* 25.5*  MCV 84.8 85.5 84.4  PLT 139* 125* 120*     INR: LastLabs       Recent Labs  Lab 06/06/17 2237 06/07/17 0430 06/08/17 0359  INR 2.36 2.35 1.98     Other results:  EKG:  Imaging   ImagingResults(Last48hours)  Dg Chest Portable 1 View  Result Date: 06/07/2017 CLINICAL DATA:  Central line placement. EXAM: PORTABLE CHEST 1 VIEW COMPARISON:  Chest radiograph performed 06/06/2017 FINDINGS: The right IJ line is noted ending about the mid SVC. Mild bibasilar opacities may reflect atelectasis or mild interstitial edema. No pleural effusion or pneumothorax is seen. The cardiomediastinal silhouette is normal in size. The patient is status post median sternotomy. An AICD is noted at the left chest wall. A left ventricular assist device is partially imaged. No acute osseous abnormalities are seen. IMPRESSION: 1. Right IJ line noted ending about the mid SVC. No pneumothorax. 2. Mild bibasilar airspace opacities may reflect atelectasis or mild interstitial edema. Electronically Signed   By: Roanna Raider M.D.   On: 06/07/2017 00:45   Dg Chest Port 1 View  Result Date: 06/06/2017 CLINICAL DATA:  Chills, weakness, and body aches. EXAM: PORTABLE CHEST 1 VIEW COMPARISON:  Chest x-ray dated April 11, 2017. FINDINGS: The bilateral costophrenic angles are not included in the field of view. Unchanged single lead left chest wall pacer device. Partially visualized LVAD. Stable cardiomediastinal silhouette. Normal pulmonary vascularity. Stable bibasilar scarring. No consolidation, pneumothorax, or large pleural effusion. No acute osseous abnormality. Old right-sided rib fractures again noted. IMPRESSION: No active disease. Electronically Signed   By: Obie Dredge M.D.   On: 06/06/2017 23:45   Dg Foot Complete Right  Result Date: 06/06/2017 Please see detailed radiograph report in office note.      Medications:     Scheduled Medications:  .  atorvastatin   80 mg  Oral  q1800   .  Chlorhexidine Gluconate Cloth   6 each  Topical  Daily   .  danazol   100 mg  Oral  BID   .  ezetimibe   10 mg  Oral  Daily   .  feeding supplement (ENSURE ENLIVE)   237 mL  Oral  BID BM   .  ferrous sulfate   325 mg  Oral  BID WC   .  furosemide   40 mg  Intravenous  BID   .  insulin aspart   0-15 Units  Subcutaneous  TID WC   .  insulin aspart   0-5 Units  Subcutaneous  QHS   .  insulin aspart protamine- aspart   12 Units  Subcutaneous  BID WC   .  mouth rinse   15 mL  Mouth Rinse  BID   .  metoCLOPramide   5 mg  Oral  TID AC   .  pantoprazole   40 mg  Oral  BID   .  potassium chloride   40 mEq  Oral  BID   .  sertraline   25 mg  Oral  Daily   .  sildenafil   20 mg  Oral  TID   .  sodium chloride flush   10-40 mL  Intracatheter  Q12H   .  sodium chloride flush   3 mL  Intravenous  Q12H     Infusions:  .  sodium chloride      .  sodium chloride  10 mL/hr at 06/08/17 0600   .   ceFAZolin (ANCEF) IV  2 g (06/08/17 0355)     PRN Medications:  sodium chloride, acetaminophen **OR** acetaminophen, acetaminophen, ondansetron (ZOFRAN) IV, sodium chloride flush, sodium chloride flush   Patient  Profile   [name] is a 62 y.o. malesmoker with history  of PAD, CAD, and ischemic cardiomyopathy s/p HVAD placementon5/29/18.   Admitted 06/06/17 with septic shock with osteomyelitis in R great toe.   Assessment/Plan:    1. Suspected sepsis/septic shock  - Resolved, MAPs responded well to fluid bolus. Lactate not elevated.  - BCx 06/06/17 2/2 MSSA. ID following. Now on Ancef. - Tmax 100.4. - CXR clear - Ultimately will need right great toe amputation. Plan for this afternoon.  2. Chronic systolic WJX:[id number] cardiomyopathy - Echo in 3/18 with EF 20-25%. St Jude ICD.  - s/p HVAD placement. Lavare cycle is on - VAD interrogated personally. Parameters stable. No alarms.  - LDH stable.  - Hold losartan and spironolactone with soft BP.  - Continue lasix 40 mg IV BID. CVP 13-14.  - Hold warfarin for now with planned toe amputation.  3. CAD:  - 3 vessel CAD, s/p stent to proximal LAD and PTCA to subtotally occluded large OM1 in 1/18.  - No s/s of ischemia.    - Continue atorvastatin 80 mg daily. 4. PAD: - Stable claudication, notes with moderate walking on left. Left fem-pop bypass occluded.Now with right 2nd toe ulceration and suspected osteomyelitis.  - Will need toe amputation this afternoon. Holding warfarin as above.  - RLE Duplex 06/07/17 with patent gradt from previous fem-bop bypass. Unreliable waveforms with LVAD - ABIs 06/07/17 with non-compressible L/RLE arteries. Unable to obtain TBIs due to VAD waveforms.  5. Pulmonary arterial HTN:  - Continue Revatio 20 mg TID as pressure tolerated.  - No change to current plan.   6. Smoking: - Encouraged complete cessation. No change 7. Upper GI bleeding: 7/18, small bowel AVMs. ASA was decreased to 81 daily. Warfarin INR goal is now 2-2.5. He started monthly octreotide injections but had profuse diarrhea with octreotide after 1st injection and after 2nd injection with decreased dose. He is tolerating danazol without  problems. 12/18 had more melena in setting of high INR =>due to vigorous epistaxis versus AVM bleeding. ASA was stopped. - No bleeding currently.  - He stay off ASA 81 and will continue INR goal 2-2.5 for now. - Continue danazol. 8. L1 Burst Fracture:  - Stable. Once ambulatory needs to wear brace when walking.  9. Diabetes:  - Cover with home NPH and sliding scale. No change.   Dr Logan Bores has seen and plans R second toe amputation this afternoon.   I reviewed the HVAD parameters from today, and compared the results to the patient's prior recorded data.  No programming changes were made.  The HVAD is functioning within specified parameters.    Length of Stay: 2  [name] 06/08/2017, 7:53 AM  VAD Team --- VAD ISSUES ONLY--- Pager [phone] (7am - 7am)  Advanced Heart Failure Team  Pager [phone] (M-F; 7a - 4p)  Please contact [facility] for night-coverage after hours (4p -7a ) and weekends on [email or web address]  Patient seen with PA, agree with the above note. Admitted with early septic shock that responded to IV fluid and antibiotics. MAP in 70s-80s today.  He is looking better, diuresed well with much decreased edema.  Creatinine back to 1.1.  Blood cultures with MSSA, now on Ancef. He is going for right great toe amputation (osteomyelitis) today. CVP remains 13-14.   On exam, normal HVAD sounds. JVP 12 cm. 1+ edema to knee on right, right 2nd toe wrapped. Clear lungs.   He is afebrile currently, on Ancef for MSSA bacteremia. I suspect source of septic shock/bacteremia is osteomyelitis of right  great toe.  - Surgery today for toe amputation.  - Needs TEE give LVAD and ICD, will likely be Monday as he is going for surgery soon. Will ask ID if he should be on rifampin in the interim.    MAP stable this morning in 70s-80s. CVP 13-14.  He looks better today.  - Continue to hold losartan, spironolactone.  - Will give Lasix 40 mg IV bid again today.    OK per podiatry to continue warfarin, will continue for goal INR 2-2.5.  Hemoglobin lower today without overt bleeding.  Follow closely.   [name] 06/08/2017 2:04 PM

## 2017-06-11 ENCOUNTER — Encounter (HOSPITAL_COMMUNITY): Payer: Self-pay

## 2017-06-11 ENCOUNTER — Encounter (HOSPITAL_COMMUNITY)
Admission: EM | Disposition: A | Discharge status: Home Health | Payer: Self-pay | Source: Home / Self Care | Attending: Cardiology

## 2017-06-11 ENCOUNTER — Inpatient Hospital Stay (HOSPITAL_COMMUNITY): Payer: BLUE CROSS/BLUE SHIELD | Admitting: Certified Registered Nurse Anesthetist

## 2017-06-11 ENCOUNTER — Inpatient Hospital Stay: Payer: Self-pay

## 2017-06-11 ENCOUNTER — Inpatient Hospital Stay (HOSPITAL_COMMUNITY)
Admit: 2017-06-11 | Discharge: 2017-06-11 | Disposition: A | Discharge status: Home / Self Care | Payer: BLUE CROSS/BLUE SHIELD | Attending: Internal Medicine | Admitting: Internal Medicine

## 2017-06-11 DIAGNOSIS — B9561 Methicillin susceptible Staphylococcus aureus infection as the cause of diseases classified elsewhere: Secondary | ICD-10-CM

## 2017-06-11 DIAGNOSIS — Z5181 Encounter for therapeutic drug level monitoring: Secondary | ICD-10-CM

## 2017-06-11 DIAGNOSIS — R7881 Bacteremia: Secondary | ICD-10-CM

## 2017-06-11 DIAGNOSIS — M869 Osteomyelitis, unspecified: Secondary | ICD-10-CM

## 2017-06-11 DIAGNOSIS — I340 Nonrheumatic mitral (valve) insufficiency: Secondary | ICD-10-CM

## 2017-06-11 DIAGNOSIS — Z89421 Acquired absence of other right toe(s): Secondary | ICD-10-CM

## 2017-06-11 HISTORY — PX: TEE WITHOUT CARDIOVERSION: SHX5443

## 2017-06-11 LAB — BASIC METABOLIC PANEL
ANION GAP: 5 (ref 5–15)
BUN: 8 mg/dL (ref 6–20)
CHLORIDE: 102 mmol/L (ref 101–111)
CO2: 26 mmol/L (ref 22–32)
Calcium: 8.1 mg/dL — ABNORMAL LOW (ref 8.9–10.3)
Creatinine, Ser: 0.93 mg/dL (ref 0.61–1.24)
GFR calc Af Amer: >60 mL/min (ref >60)
GFR calc non Af Amer: >60 mL/min (ref >60)
GLUCOSE: 189 mg/dL — AB (ref 65–99)
POTASSIUM: 4.2 mmol/L (ref 3.5–5.1)
Sodium: 133 mmol/L — ABNORMAL LOW (ref 135–145)

## 2017-06-11 LAB — GLUCOSE, CAPILLARY
GLUCOSE-CAPILLARY: 141 mg/dL — AB (ref 65–99)
GLUCOSE-CAPILLARY: 214 mg/dL — AB (ref 65–99)
GLUCOSE-CAPILLARY: 183 mg/dL — AB (ref 65–99)
Glucose-Capillary: 147 mg/dL — ABNORMAL HIGH (ref 65–99)
Glucose-Capillary: 164 mg/dL — ABNORMAL HIGH (ref 65–99)
Glucose-Capillary: 144 mg/dL — ABNORMAL HIGH (ref 65–99)

## 2017-06-11 LAB — LACTATE DEHYDROGENASE: LDH: 172 U/L (ref 98–192)

## 2017-06-11 LAB — CBC
HEMATOCRIT: 25.9 % — AB (ref 39.0–52.0)
Hemoglobin: 8.2 g/dL — ABNORMAL LOW (ref 13.0–17.0)
MCH: 27.3 pg (ref 26.0–34.0)
MCHC: 31.7 g/dL (ref 30.0–36.0)
MCV: 86.3 fL (ref 78.0–100.0)
Platelets: 138 10*3/uL — ABNORMAL LOW (ref 150–400)
RBC: 3 MIL/uL — AB (ref 4.22–5.81)
RDW: 17.5 % — ABNORMAL HIGH (ref 11.5–15.5)
WBC: 5 10*3/uL (ref 4.0–10.5)

## 2017-06-11 LAB — PROTIME-INR
INR: 3.31
Prothrombin Time: 33.4 seconds — ABNORMAL HIGH (ref 11.4–15.2)

## 2017-06-11 SURGERY — ECHOCARDIOGRAM, TRANSESOPHAGEAL
Anesthesia: General

## 2017-06-11 MED ORDER — LIDOCAINE 2% (20 MG/ML) 5 ML SYRINGE
INTRAMUSCULAR | Status: DC | PRN
  Administered 2017-06-11: 100 mg via INTRAVENOUS

## 2017-06-11 MED ORDER — SODIUM CHLORIDE 0.9% FLUSH
10.0000 mL | Freq: Two times a day (BID) | INTRAVENOUS | Status: DC
  Administered 2017-06-11: 10 mL

## 2017-06-11 MED ORDER — SODIUM CHLORIDE 0.9% FLUSH
10.0000 mL | INTRAVENOUS | Status: DC | PRN
  Administered 2017-06-12: 10 mL
  Filled 2017-06-11: qty 40

## 2017-06-11 MED ORDER — MIDAZOLAM HCL 2 MG/2ML IJ SOLN
INTRAMUSCULAR | Status: DC | PRN
  Administered 2017-06-11 (×2): 1 mg via INTRAVENOUS

## 2017-06-11 MED ORDER — FENTANYL CITRATE (PF) 250 MCG/5ML IJ SOLN
INTRAMUSCULAR | Status: DC | PRN
  Administered 2017-06-11: 25 ug via INTRAVENOUS

## 2017-06-11 MED ORDER — WARFARIN SODIUM 1 MG PO TABS: 1.0000 mg | ORAL_TABLET | Freq: Once | ORAL | Status: DC

## 2017-06-11 MED ORDER — ETOMIDATE 2 MG/ML IV SOLN
INTRAVENOUS | Status: DC | PRN
  Administered 2017-06-11: 2 mg via INTRAVENOUS
  Administered 2017-06-11: 10 mg via INTRAVENOUS
  Administered 2017-06-11: 4 mg via INTRAVENOUS

## 2017-06-11 NOTE — H&P (View-Only) (Signed)
Advanced Heart Failure VAD Team Note  PCP-Cardiologist: No primary care provider on file.   Subjective:    Admitted with sepsis/osteomyelitis. BCx 06/06/17 2/2 MSSA bacteremia. ID following.   Underwent toe amputation on 6/7  Denies SOB. R foot remains numb. INR 3.3    HVAD parameters RPM 2700 Flow 4.5  Peak 6.5 Trough 2.5  Suction On.    Objective:    Vital Signs:   Temp:  [97.6 F (36.4 C)-99 F (37.2 C)] 98.9 F (37.2 C) (06/10 0737) Pulse Rate:  [54-92] 85 (06/10 0737) Resp:  [9-16] 9 (06/10 0737) BP: (93-100)/(56-90) 100/90 (06/10 0737) SpO2:  [98 %-100 %] 100 % (06/10 0737) Last BM Date: 06/08/17 Mean arterial Pressure 80s  Intake/Output:   Intake/Output Summary (Last 24 hours) at 06/11/2017 0755 Last data filed at 06/11/2017 0700 Gross per 24 hour  Intake 1150 ml  Output 1000 ml  Net 150 ml    Physical Exam   Physical Exam: GENERAL:In bed NAD.  HEENT: normal  NECK: Supple, JVP 6-7   2+ bilaterally, no bruits.  No lymphadenopathy or thyromegaly appreciated.  RIJ  CARDIAC:  Mechanical heart sounds with LVAD hum present.  LUNGS:  Clear to auscultation bilaterally.  ABDOMEN:  Soft, round, nontender, positive bowel sounds x4.     LVAD exit site: .  Dressing dry and intact.  No erythema or drainage.  Stabilization device present and accurately applied.  Driveline dressing is being changed daily per sterile technique. EXTREMITIES:  Warm and dry, no cyanosis, clubbing, rash or edema . R foot dressing.  NEUROLOGIC:  Alert and oriented x 4.  Gait steady.  No aphasia.  No dysarthria.  Affect pleasant.       Telemetry   NSR 80-90s personally reviewed.   EKG    No new tracings.    Labs   Basic Metabolic Panel: Recent Labs  Lab 06/08/17 0359 06/08/17 2037 06/09/17 0353 06/10/17 0147 06/11/17 0500  NA 132* 135 133* 134* 133*  K 3.7 4.0 4.2 4.6 4.2  CL 100* 100* 100* 99* 102  CO2 26 26 26 26 26   GLUCOSE 148* 144* 141* 107* 189*  BUN 13 9 10 9 8     CREATININE 1.13 1.06 1.07 1.09 0.93  CALCIUM 8.2* 8.3* 8.3* 8.4* 8.1*    Liver Function Tests: Recent Labs  Lab 06/06/17 2237  AST 32  ALT 25  ALKPHOS 61  BILITOT 1.3*  PROT 7.3  ALBUMIN 2.9*   No results for input(s): LIPASE, AMYLASE in the last 168 hours. No results for input(s): AMMONIA in the last 168 hours.  CBC: Recent Labs  Lab 06/06/17 2237  06/08/17 0359 06/08/17 2037 06/09/17 0353 06/10/17 0147 06/11/17 0500  WBC 11.5*   < > 5.9 5.2 4.6 3.7* 5.0  NEUTROABS 10.6*  --   --   --   --   --   --   HGB 9.6*   < > 8.1* 8.4* 8.3* 8.3* 8.2*  HCT 29.6*   < > 25.5* 26.8* 26.3* 26.4* 25.9*  MCV 84.8   < > 84.4 87.0 86.8 86.0 86.3  PLT 139*   < > 120* 119* 118* 109* 138*   < > = values in this interval not displayed.   INR: Recent Labs  Lab 06/08/17 1345 06/08/17 2037 06/09/17 0353 06/10/17 0147 06/11/17 0500  INR 1.75 1.63 1.83 2.73 3.31   Other results:  EKG:   Imaging   No results found.   Medications:  Scheduled Medications: . atorvastatin  80 mg Oral q1800  . danazol  100 mg Oral BID  . ezetimibe  10 mg Oral Daily  . feeding supplement (ENSURE ENLIVE)  237 mL Oral BID BM  . ferrous sulfate  325 mg Oral BID WC  . insulin aspart  0-15 Units Subcutaneous TID WC  . insulin aspart  0-5 Units Subcutaneous QHS  . insulin aspart protamine- aspart  12 Units Subcutaneous BID WC  . mouth rinse  15 mL Mouth Rinse BID  . metoCLOPramide  5 mg Oral TID AC  . pantoprazole  40 mg Oral BID  . potassium chloride  20 mEq Oral Daily  . rifampin  300 mg Oral Q12H  . sertraline  25 mg Oral Daily  . sildenafil  20 mg Oral TID  . sodium chloride flush  10-40 mL Intracatheter Q12H  . sodium chloride flush  3 mL Intravenous Q12H  . spironolactone  12.5 mg Oral Daily  . torsemide  40 mg Oral Daily  . Warfarin - Pharmacist Dosing Inpatient   Does not apply q1800    Infusions: . sodium chloride Stopped (06/08/17 1848)  . sodium chloride 10 mL/hr at 06/10/17  0615  . sodium chloride Stopped (06/08/17 1847)  .  ceFAZolin (ANCEF) IV 2 g (06/11/17 0358)  . lactated ringers Stopped (06/10/17 0630)    PRN Medications: sodium chloride, sodium chloride, acetaminophen **OR** acetaminophen, acetaminophen, ondansetron (ZOFRAN) IV, oxyCODONE-acetaminophen, sodium chloride, sodium chloride flush, sodium chloride flush, traMADol, white petrolatum   Patient Profile   [name] is a 62 y.o. malesmoker with history of PAD, CAD, and ischemic cardiomyopathy s/p HVAD placement on 05/30/16.   Admitted 06/06/17 with septic shock with osteomyelitis in R great toe.   Assessment/Plan:    1. MSSA sepsis/septic shock due to R great toe osteo - Resolved, MAPs responded well to fluid bolus.   - BCx 06/06/17 2/2 MSSA. ID following.  -Remains on Ancef and rifampin. Will need very close monitoring of INR - Afebrile - s/p right great toe amputation 6/7 - NPO. Plan for TEE today at 1200 2. Chronic systolic ZSW:FUXNATFT cardiomyopathy - Echo in 3/18 with EF 20-25%. St Jude ICD.  - s/p HVAD placement. Lavare cycle is on - VAD interrogated personally. Parameters stable. - LDH 172  - Losartan on hold with soft BP. Restarted spiro 12.5 - INR 3.3 on  warfarin.With rifampin will need very close monitoring of INR 3. CAD:  - 3 vessel CAD, s/p stent to proximal LAD and PTCA to subtotally occluded large OM1 in 1/18.  -No s/s ischemia. Continue atorvastatin 80 mg daily.  4. PAD: - Stable claudication, notes with moderate walking on left. Left fem-pop bypass occluded. Now with right 2nd toe ulceration and suspected osteomyelitis.  - s/p amputation on R great toe on 6/7 - - RLE Duplex 06/07/17 with patent gradt from previous fem-bop bypass. Unreliable waveforms with LVAD - ABIs 06/07/17 with non-compressible L/RLE arteries. Unable to obtain TBIs due to VAD waveforms.  5. Pulmonary arterial HTN:  - Continue Revatio 20 mg TID as pressure tolerated.  - No change to current  plan.   6. Smoking:  - Encouraged complete cessation. No change 7. Upper GI bleeding: 7/18, small bowel AVMs. ASA was decreased to 81 daily. Warfarin INR goal is now 2-2.5. He started monthly octreotide injections but had profuse diarrhea with octreotide after 1st injection and after 2nd injection with decreased dose. He is tolerating danazol without problems. 12/18 had more  melena in setting of high INR =>due to vigorous epistaxis versus AVM bleeding. ASA was stopped.  - No bleeding currently. Hgb 8.2  - He stay off ASA 81 and will continue INR goal 2-2.5 for now. INR 3.3  - Discussed with pharmacy.  - Continue danazol.  8. L1 Burst Fracture:  - Stable. Once ambulatory needs to wear brace when walking.  9. Diabetes:  - Cover with home NPH and sliding scale. No change.   PT consult pending.   Length of Stay: 5  Dalton BecketAmy Clegg, [name]  7:55 AM   VAD Team --- VAD ISSUES ONLY--- Pager [phone] (7am - 7am)  Advanced Heart Failure Team  Pager [phone] (M-F; 7a - 4p)    Patient seen with [name], agree with the above note.  Stable s/p toe amputation on Friday.  Afebrile, he remains on Ancef and rifampin.   - TEE today. Will discuss results with ID.   Hemoglobin stable.   MAP stable. Will likely resume losartan before discharge.   Volume status looks ok on exam, continue torsemide 40 mg daily.   Mobilize, needs to walk.   [name] 06/11/2017 8:18 AM

## 2017-06-11 NOTE — Interval H&P Note (Signed)
History and Physical Interval Note:  06/11/2017 1:03 PM  [name]  has presented today for surgery, with the diagnosis of bactermia  The various methods of treatment have been discussed with the patient and family. After consideration of risks, benefits and other options for treatment, the patient has consented to  Procedure(s): TRANSESOPHAGEAL ECHOCARDIOGRAM (TEE) (N/A) as a surgical intervention .  The patient's history has been reviewed, patient examined, no change in status, stable for surgery.  I have reviewed the patient's chart and labs.  Questions were answered to the patient's satisfaction.     Seddrick Flax [employer]

## 2017-06-11 NOTE — Care Management Note (Signed)
Case Management Note  Patient Details  Name: Lynda RainwaterLarry D Dardis MRN: [id number] Date of Birth: March 04, 1955  Subjective/Objective:  Pt admitted with MSSA bacteremia                  Action/Plan:  PTA independent from home with family.  Pt informed CM that he has a PCP and denied barriers to obtaining/paying for medications.  Pt will discharge home on IV antibiotics - [facility] and [facility] accepted.   Expected Discharge Date:                  Expected Discharge Plan:  Home w Home Health Services  In-House Referral:     Discharge planning Services  CM Consult  Post Acute Care Choice:    Choice offered to:     DME Arranged:    DME Agency:     HH Arranged:  RN HH Agency:  [facility]  Status of Service:  In process, will continue to follow  If discussed at Long Length of Stay Meetings, dates discussed:    Additional Comments:  Cherylann ParrClaxton, Kyshawn Teal S, RN 06/11/2017, 4:10 PM

## 2017-06-11 NOTE — Progress Notes (Signed)
  Echocardiogram Echocardiogram Transesophageal has been performed.  Dalton PartridgeBrooke S [name] 06/11/2017, 1:31 PM

## 2017-06-11 NOTE — CV Procedure (Signed)
Procedure:TEE  Indication: MSSA bacteremia  Sedation: Per anesthesiology  Findings: Please see echo section for full report.  Mildly dilated LV with EF 15%, diffuse hypokinesis.  LVAD inflow cannula noted, no abnormality.  The RV was mildly dilated with normal systolic function.  Moderate left atrial enlargement, no LA appendage thrombus.  Mildly dilated right atrium.  ICD noted passing through RA, no vegetation.  Mild tricuspid regurgitation, no vegetation.  Peak RV-RA gradient 24 mmHg.  No pulmonic valve vegetation.  The aortic valve is trileaflet, opens rarely. There was mild aortic insufficiency.  No AoV vegetation.  Mild to moderate mitral regurgitation, no vegetation.  Normal caliber aorta with mild plaque.   No evidence on TEE for endocarditis.   Marca AnconaDalton Jensyn Shave 06/11/2017 1:23 PM

## 2017-06-11 NOTE — Anesthesia Preprocedure Evaluation (Signed)
Anesthesia Evaluation  Patient identified by MRN, date of birth, ID band Patient awake    Reviewed: Allergy & Precautions, NPO status , Patient's Chart, lab work & pertinent test results  Airway Mallampati: II  TM Distance: >3 FB Neck ROM: Full    Dental no notable dental hx.    Pulmonary Current Smoker,    breath sounds clear to auscultation + decreased breath sounds      Cardiovascular hypertension, + CAD, + Peripheral Vascular Disease and +CHF  + pacemaker + Cardiac Defibrillator + Valvular Problems/Murmurs AI  Rhythm:Regular Rate:Normal + Systolic murmurs Left ventricle: The cavity size was mildly dilated. Systolic   function was severely reduced. The estimated ejection fraction   was in the range of 20% to 25%. Diffuse hypokinesis. - Aortic valve: There was mild regurgitation. - Mitral valve: Calcified annulus. There was mild regurgitation.  Impressions:  - LVAD RAMP study; full images not obtained; severe LV systolic   dysfunction; mild AI, MR and TR.   Neuro/Psych negative neurological ROS  negative psych ROS   GI/Hepatic negative GI ROS, Neg liver ROS,   Endo/Other  diabetes  Renal/GU negative Renal ROS  negative genitourinary   Musculoskeletal negative musculoskeletal ROS (+)   Abdominal   Peds negative pediatric ROS (+)  Hematology  (+) anemia ,   Anesthesia Other Findings   Reproductive/Obstetrics negative OB ROS                             Anesthesia Physical Anesthesia Plan  ASA: IV  Anesthesia Plan: General   Post-op Pain Management:    Induction: Intravenous  PONV Risk Score and Plan: 0 and Treatment may vary due to age or medical condition  Airway Management Planned: Mask  Additional Equipment:   Intra-op Plan:   Post-operative Plan:   Informed Consent: I have reviewed the patients History and Physical, chart, labs and discussed the procedure  including the risks, benefits and alternatives for the proposed anesthesia with the patient or authorized representative who has indicated his/her understanding and acceptance.   Dental advisory given  Plan Discussed with: CRNA and Surgeon  Anesthesia Plan Comments:         Anesthesia Quick Evaluation

## 2017-06-11 NOTE — Progress Notes (Addendum)
Advanced Heart Failure VAD Team Note  PCP-Cardiologist: No primary care provider on file.   Subjective:    Admitted with sepsis/osteomyelitis. BCx 06/06/17 2/2 MSSA bacteremia. ID following.   Underwent toe amputation on 6/7  Denies SOB. R foot remains numb. INR 3.3    HVAD parameters RPM 2700 Flow 4.5  Peak 6.5 Trough 2.5  Suction On.    Objective:    Vital Signs:   Temp:  [97.6 F (36.4 C)-99 F (37.2 C)] 98.9 F (37.2 C) (06/10 0737) Pulse Rate:  [54-92] 85 (06/10 0737) Resp:  [9-16] 9 (06/10 0737) BP: (93-100)/(56-90) 100/90 (06/10 0737) SpO2:  [98 %-100 %] 100 % (06/10 0737) Last BM Date: 06/08/17 Mean arterial Pressure 80s  Intake/Output:   Intake/Output Summary (Last 24 hours) at 06/11/2017 0755 Last data filed at 06/11/2017 0700 Gross per 24 hour  Intake 1150 ml  Output 1000 ml  Net 150 ml    Physical Exam   Physical Exam: GENERAL:In bed NAD.  HEENT: normal  NECK: Supple, JVP 6-7   2+ bilaterally, no bruits.  No lymphadenopathy or thyromegaly appreciated.  RIJ  CARDIAC:  Mechanical heart sounds with LVAD hum present.  LUNGS:  Clear to auscultation bilaterally.  ABDOMEN:  Soft, round, nontender, positive bowel sounds x4.     LVAD exit site: .  Dressing dry and intact.  No erythema or drainage.  Stabilization device present and accurately applied.  Driveline dressing is being changed daily per sterile technique. EXTREMITIES:  Warm and dry, no cyanosis, clubbing, rash or edema . R foot dressing.  NEUROLOGIC:  Alert and oriented x 4.  Gait steady.  No aphasia.  No dysarthria.  Affect pleasant.       Telemetry   NSR 80-90s personally reviewed.   EKG    No new tracings.    Labs   Basic Metabolic Panel: Recent Labs  Lab 06/08/17 0359 06/08/17 2037 06/09/17 0353 06/10/17 0147 06/11/17 0500  NA 132* 135 133* 134* 133*  K 3.7 4.0 4.2 4.6 4.2  CL 100* 100* 100* 99* 102  CO2 26 26 26 26 26  GLUCOSE 148* 144* 141* 107* 189*  BUN 13 9 10 9 8    CREATININE 1.13 1.06 1.07 1.09 0.93  CALCIUM 8.2* 8.3* 8.3* 8.4* 8.1*    Liver Function Tests: Recent Labs  Lab 06/06/17 2237  AST 32  ALT 25  ALKPHOS 61  BILITOT 1.3*  PROT 7.3  ALBUMIN 2.9*   No results for input(s): LIPASE, AMYLASE in the last 168 hours. No results for input(s): AMMONIA in the last 168 hours.  CBC: Recent Labs  Lab 06/06/17 2237  06/08/17 0359 06/08/17 2037 06/09/17 0353 06/10/17 0147 06/11/17 0500  WBC 11.5*   < > 5.9 5.2 4.6 3.7* 5.0  NEUTROABS 10.6*  --   --   --   --   --   --   HGB 9.6*   < > 8.1* 8.4* 8.3* 8.3* 8.2*  HCT 29.6*   < > 25.5* 26.8* 26.3* 26.4* 25.9*  MCV 84.8   < > 84.4 87.0 86.8 86.0 86.3  PLT 139*   < > 120* 119* 118* 109* 138*   < > = values in this interval not displayed.   INR: Recent Labs  Lab 06/08/17 1345 06/08/17 2037 06/09/17 0353 06/10/17 0147 06/11/17 0500  INR 1.75 1.63 1.83 2.73 3.31   Other results:  EKG:   Imaging   No results found.   Medications:       Scheduled Medications: . atorvastatin  80 mg Oral q1800  . danazol  100 mg Oral BID  . ezetimibe  10 mg Oral Daily  . feeding supplement (ENSURE ENLIVE)  237 mL Oral BID BM  . ferrous sulfate  325 mg Oral BID WC  . insulin aspart  0-15 Units Subcutaneous TID WC  . insulin aspart  0-5 Units Subcutaneous QHS  . insulin aspart protamine- aspart  12 Units Subcutaneous BID WC  . mouth rinse  15 mL Mouth Rinse BID  . metoCLOPramide  5 mg Oral TID AC  . pantoprazole  40 mg Oral BID  . potassium chloride  20 mEq Oral Daily  . rifampin  300 mg Oral Q12H  . sertraline  25 mg Oral Daily  . sildenafil  20 mg Oral TID  . sodium chloride flush  10-40 mL Intracatheter Q12H  . sodium chloride flush  3 mL Intravenous Q12H  . spironolactone  12.5 mg Oral Daily  . torsemide  40 mg Oral Daily  . Warfarin - Pharmacist Dosing Inpatient   Does not apply q1800    Infusions: . sodium chloride Stopped (06/08/17 1848)  . sodium chloride 10 mL/hr at 06/10/17  0615  . sodium chloride Stopped (06/08/17 1847)  .  ceFAZolin (ANCEF) IV 2 g (06/11/17 0358)  . lactated ringers Stopped (06/10/17 0614)    PRN Medications: sodium chloride, sodium chloride, acetaminophen **OR** acetaminophen, acetaminophen, ondansetron (ZOFRAN) IV, oxyCODONE-acetaminophen, sodium chloride, sodium chloride flush, sodium chloride flush, traMADol, white petrolatum   Patient Profile   [name] is a 62 y.o. malesmoker with history of PAD, CAD, and ischemic cardiomyopathy s/p HVAD placement on 05/30/16.   Admitted 06/06/17 with septic shock with osteomyelitis in R great toe.   Assessment/Plan:    1. MSSA sepsis/septic shock due to R great toe osteo - Resolved, MAPs responded well to fluid bolus.   - BCx 06/06/17 2/2 MSSA. ID following.  -Remains on Ancef and rifampin. Will need very close monitoring of INR - Afebrile - s/p right great toe amputation 6/7 - NPO. Plan for TEE today at 1200 2. Chronic systolic CHF:Ischemic cardiomyopathy - Echo in 3/18 with EF 20-25%. St Jude ICD.  - s/p HVAD placement. Lavare cycle is on - VAD interrogated personally. Parameters stable. - LDH 172  - Losartan on hold with soft BP. Restarted spiro 12.5 - INR 3.3 on  warfarin.With rifampin will need very close monitoring of INR 3. CAD:  - 3 vessel CAD, s/p stent to proximal LAD and PTCA to subtotally occluded large OM1 in 1/18.  -No s/s ischemia. Continue atorvastatin 80 mg daily.  4. PAD: - Stable claudication, notes with moderate walking on left. Left fem-pop bypass occluded. Now with right 2nd toe ulceration and suspected osteomyelitis.  - s/p amputation on R great toe on 6/7 - - RLE Duplex 06/07/17 with patent gradt from previous fem-bop bypass. Unreliable waveforms with LVAD - ABIs 06/07/17 with non-compressible L/RLE arteries. Unable to obtain TBIs due to VAD waveforms.  5. Pulmonary arterial HTN:  - Continue Revatio 20 mg TID as pressure tolerated.  - No change to current  plan.   6. Smoking:  - Encouraged complete cessation. No change 7. Upper GI bleeding: 7/18, small bowel AVMs. ASA was decreased to 81 daily. Warfarin INR goal is now 2-2.5. He started monthly octreotide injections but had profuse diarrhea with octreotide after 1st injection and after 2nd injection with decreased dose. He is tolerating danazol without problems. 12/18 had more   melena in setting of high INR =>due to vigorous epistaxis versus AVM bleeding. ASA was stopped.  - No bleeding currently. Hgb 8.2  - He stay off ASA 81 and will continue INR goal 2-2.5 for now. INR 3.3  - Discussed with pharmacy.  - Continue danazol.  8. L1 Burst Fracture:  - Stable. Once ambulatory needs to wear brace when walking.  9. Diabetes:  - Cover with home NPH and sliding scale. No change.   PT consult pending.   Length of Stay: 5  Amy Clegg, NP  7:55 AM   VAD Team --- VAD ISSUES ONLY--- Pager [phone] (7am - 7am)  Advanced Heart Failure Team  Pager [phone] (M-F; 7a - 4p)    Patient seen with NP, agree with the above note.  Stable s/p toe amputation on Friday.  Afebrile, he remains on Ancef and rifampin.   - TEE today. Will discuss results with ID.   Hemoglobin stable.   MAP stable. Will likely resume losartan before discharge.   Volume status looks ok on exam, continue torsemide 40 mg daily.   Mobilize, needs to walk.   Jamilex Bohnsack 06/11/2017 8:18 AM      

## 2017-06-11 NOTE — Progress Notes (Signed)
Peripherally Inserted Central Catheter/Midline Placement  The IV Nurse has discussed with the patient and/or persons authorized to consent for the patient, the purpose of this procedure and the potential benefits and risks involved with this procedure.  The benefits include less needle sticks, lab draws from the catheter, and the patient may be discharged home with the catheter. Risks include, but not limited to, infection, bleeding, blood clot (thrombus formation), and puncture of an artery; nerve damage and irregular heartbeat and possibility to perform a PICC exchange if needed/ordered by physician.  Alternatives to this procedure were also discussed.  Bard Power PICC patient education guide, fact sheet on infection prevention and patient information card has been provided to patient /or left at bedside.    PICC/Midline Placement Documentation  PICC Single Lumen 06/11/17 PICC Right Brachial 40 cm 0 cm (Active)  Indication for Insertion or Continuance of Line Home intravenous therapies (PICC only) 06/11/2017  6:10 PM  Exposed Catheter (cm) 0 cm 06/11/2017  6:10 PM  Site Assessment Clean;Dry;Intact 06/11/2017  6:10 PM  Line Status Saline locked;Blood return noted;Flushed 06/11/2017  6:10 PM  Dressing Type Transparent 06/11/2017  6:10 PM  Dressing Status Clean;Antimicrobial disc in place;Dry;Intact 06/11/2017  6:10 PM  Dressing Change Due 06/18/17 06/11/2017  6:10 PM       Jannice Beitzel, Lajean ManesKerry Loraine 06/11/2017, 6:11 PM

## 2017-06-11 NOTE — Progress Notes (Signed)
VAD Coordinator Procedure Note:   Patient underwent TEE with Dr. Shirlee LatchMclean. Hemodynamics and VAD parameters monitored by me and CRNA throughout the procedure. MAPs were obtained with automatic BP cuff on left arm.       Auto cuff(MAP):  Flow:    Peak/Trough: Power:     Speed: Time:          Pre-procedure:107/86(91)      4.9 7.2/3.8   4.9       2700 1212   Sedation Induction:101/76(85)      5.0  7.2/3.8    4.3       2700  1257              107/92(99)      5.2  6.8/4    4.5       2700   1313    Recovery area: 107/90(94)       4.8    7/3.4     4.8        2700    1323   Patient tolerated the procedure well. After adequate sedation was achieved, pulse ox 96% and maintained >92% throughout the remainder of the procedure. MAPs were 85-99.   Patient Disposition: pt was transferred back to [facility] and handoff given to [facility] nurse.   Carlton AdamSarah Analina Filla RN, BSN VAD Coordinator 24/7 Pager [phone]

## 2017-06-11 NOTE — Progress Notes (Signed)
LVAD Coordinator Rounding Note:  Admitted 06/06/17 from ED for sepsis.  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 Maren BeachVanTrigt under Destination Therapy criteria due to current smoking status.  Right 2nd toe amputated on Friday.  Vital signs: Temp: 98.9 HR: 85 Doppler Pressure:  96 Automatic BP:  100/90 (95) O2 Sat: 100% on RA Wt: 240>242>237 lbs  HVAD Interrogation Reveals: Speed: 2700 Flow: 5.1 Power: 4.3w Peak/Trough: 7/4.0 Alarms: none Flow alarm limit: 2.0 Power alarm limit: 6.5 Hematocrit: 26 Suction alarm: on Lavare cycle: on  Drive Line:  Left abdominal sorbaview dressing dry and intact; anchor intact and accurately applied. Weekly dressings; bedside RN may change dressing has needed. Due on today 6/10. Bedside nurse or pts Dad to change his dressing today.   Labs:  LDH trend: 159>181>172  INR trend: 2.35>1.98>3.31  WBC: 11.5>9.1>5.9>5.0  Anticoagulation Plan: -INR Goal: 2.0 - 2.5 -ASA Dose: no ASA due to hx of GI bleed  Device: - St Jude single lead -Therapies: on at 200 bpm  Adverse Events on VAD: 05/31/16 - Controller power-up associated with pump start event. Controller change out performed.  06/01/16 - Controller pins lubricated per St Jude reps  06/17/16 - Controller power-up associated with pump start event while changing power source 06/22/16 - Controller pins re-lubricated per [employer] reps   Plan/Recommendations:   1. VAD coordinator will accompany patient to Endo for TEE today. Pt can manage his equipment independently in Endo until VAD coordinator arrives prior to procedure.  2. Please call VAD pager with any VAD equipment or drive line issues  Carlton AdamSarah Herbert RN, VAD Coordinator 24/7 VAD Pager: [phone]

## 2017-06-11 NOTE — Transfer of Care (Signed)
Immediate Anesthesia Transfer of Care Note  Patient: Lynda RainwaterLarry D Hereford  Procedure(s) Performed: TRANSESOPHAGEAL ECHOCARDIOGRAM (TEE) (N/A )  Patient Location: Endoscopy Unit  Anesthesia Type:General  Level of Consciousness: awake and patient cooperative  Airway & Oxygen Therapy: Patient Spontanous Breathing and Patient connected to nasal cannula oxygen  Post-op Assessment: Report given to RN and Post -op Vital signs reviewed and stable  Post vital signs: Reviewed and stable  Last Vitals:  Vitals Value Taken Time  BP    Temp    Pulse    Resp    SpO2      Last Pain:  Vitals:   06/11/17 1146  TempSrc: Oral  PainSc: 6       Patients Stated Pain Goal: 2 (06/08/17 0800)  Complications: No apparent anesthesia complications

## 2017-06-11 NOTE — Progress Notes (Signed)
Orthopedic Tech Progress Note Patient Details:  Lynda RainwaterLarry D Siebels July 06, 1955 [id number] Patient stated that he has a post op shoe from the doctors office.  Patient ID: Lynda RainwaterLarry D Stout, male   DOB: July 06, 1955, 62 y.o.   MRN: [id number]   Jennye MoccasinHughes, Onnika Siebel Craig 06/11/2017, 12:59 AM

## 2017-06-11 NOTE — Progress Notes (Signed)
Physical Therapy Treatment Patient Details Name: Dalton RainwaterLarry D [name] MRN: [id number] DOB: 05-31-1955 Today's Date: 06/11/2017    History of Present Illness 62 y.o. male with septic schock s/p ulceration of R 2nd toe. PMH includes: DM, PVD, Osteo, LVAD, AICD, Arthritis, Dyspnea on Exertion, Heart murmur, CAD, CHF, HTN, HLD, Barretts Disease, Cerebrovascular disease, cardiomyopathy    PT Comments    Pt minimal R pain and increased ambulation tolerance with RW this date. Pt uneven and unsteady partly due to having post op shoe on R foot and sock on the L. Encouraged pt to have sister bring in a shoe for his left to even out leg length. Pt progressing well and may not need HHPT. Will address patients balance tomorrow once he has his shoe here as well. Treatment limited by transportation taking patient to TEE.   Follow Up Recommendations  Home health PT;Supervision - Intermittent(may progress and not need)     Equipment Recommendations  None recommended by PT    Recommendations for Other Services       Precautions / Restrictions Precautions Precautions: Fall Required Braces or Orthoses: Other Brace/Splint Other Brace/Splint: post op shoe on R foot Restrictions Weight Bearing Restrictions: No    Mobility  Bed Mobility Overal bed mobility: Modified Independent Bed Mobility: Supine to Sit           General bed mobility comments: increased time, labored effort, used bed rail  Transfers Overall transfer level: Needs assistance Equipment used: Rolling walker (2 wheeled) Transfers: Sit to/from Stand Sit to Stand: Supervision         General transfer comment: verbal cues to push up from bed  Ambulation/Gait Ambulation/Gait assistance: Min guard Ambulation Distance (Feet): 200 Feet Assistive device: Rolling walker (2 wheeled) Gait Pattern/deviations: Step-through pattern;Decreased stride length;Trunk flexed Gait velocity: decreased Gait velocity interpretation: 1.31 - 2.62  ft/sec, indicative of limited community ambulator General Gait Details: pt with 3 epsisodes of LOB requiring minA to maintain balance. encouraged pt to wear shoe on L foot as well to even out leg length to help with balance   Stairs             Wheelchair Mobility    Modified Rankin (Stroke Patients Only)       Balance                                            Cognition Arousal/Alertness: Awake/alert Behavior During Therapy: WFL for tasks assessed/performed Overall Cognitive Status: Within Functional Limits for tasks assessed                                        Exercises      General Comments General comments (skin integrity, edema, etc.): pt able to switch HVAD over without difficulty and don R post op shoe without difficulty as well      Pertinent Vitals/Pain Pain Assessment: No/denies pain    Home Living                      Prior Function            PT Goals (current goals can now be found in the care plan section) Acute Rehab PT Goals Patient Stated Goal: go home Progress towards PT goals: Progressing toward goals  Frequency    Min 3X/week      PT Plan Current plan remains appropriate    Co-evaluation              AM-PAC PT "6 Clicks" Daily Activity  Outcome Measure  Difficulty turning over in bed (including adjusting bedclothes, sheets and blankets)?: None Difficulty moving from lying on back to sitting on the side of the bed? : A Little Difficulty sitting down on and standing up from a chair with arms (e.g., wheelchair, bedside commode, etc,.)?: A Little Help needed moving to and from a bed to chair (including a wheelchair)?: A Little Help needed walking in hospital room?: A Little Help needed climbing 3-5 steps with a railing? : A Little 6 Click Score: 19    End of Session Equipment Utilized During Treatment: Gait belt Activity Tolerance: Patient tolerated treatment well Patient  left: in bed;with call bell/phone within reach(sitting EOB) Nurse Communication: Mobility status PT Visit Diagnosis: Other abnormalities of gait and mobility (R26.89);History of falling (Z91.81)     Time: 1110-1130 PT Time Calculation (min) (ACUTE ONLY): 20 min  Charges:  $Gait Training: 8-22 mins                    G Codes:       [name], PT, DPT Pager #: [phone] Office #: [phone]    [name] 06/11/2017, 1:17 PM

## 2017-06-11 NOTE — Progress Notes (Signed)
[facility]  Date of Admission:  06/06/2017   Total days of antibiotics 6        Day 4 Rifampin         Day 5 Cefazolin          Patient ID: Dalton RainwaterLarry D Pikus is a .age male with  Principal Problem:   MSSA bacteremia Active Problems:   Septic shock (HCC)   Osteomyelitis of toe (HCC)   LVAD (left ventricular assist device) present (HCC)   . atorvastatin  80 mg Oral q1800  . danazol  100 mg Oral BID  . ezetimibe  10 mg Oral Daily  . feeding supplement (ENSURE ENLIVE)  237 mL Oral BID BM  . ferrous sulfate  325 mg Oral BID WC  . insulin aspart  0-15 Units Subcutaneous TID WC  . insulin aspart  0-5 Units Subcutaneous QHS  . insulin aspart protamine- aspart  12 Units Subcutaneous BID WC  . mouth rinse  15 mL Mouth Rinse BID  . metoCLOPramide  5 mg Oral TID AC  . pantoprazole  40 mg Oral BID  . potassium chloride  20 mEq Oral Daily  . rifampin  300 mg Oral Q12H  . sertraline  25 mg Oral Daily  . sildenafil  20 mg Oral TID  . sodium chloride flush  10-40 mL Intracatheter Q12H  . sodium chloride flush  3 mL Intravenous Q12H  . spironolactone  12.5 mg Oral Daily  . torsemide  40 mg Oral Daily  . Warfarin - Pharmacist Dosing Inpatient   Does not apply q1800    SUBJECTIVE: Feels well today aside from some chronic low back pain. This is no worse than normal from what he describes. Afebrile and feels much improved after antibiotics. Toe pain is not too bad. Going down for TEE this afternoon around lunch time.   Allergies  Allergen Reactions  . Metformin And Related Nausea And Vomiting    *Only the extended release* (does this)  . Niacin And Related Other (See Comments)    REACTION IS SIDE EFFECT "SEVERE" FLUSHING    OBJECTIVE: Vitals:   06/10/17 1512 06/10/17 1942 06/10/17 2347 06/11/17 0737  BP: 97/82 96/83  100/90  Pulse: 83 92 90 85  Resp: 13 16 14  (!) 9  Temp: 98.9 F (37.2 C) 99 F (37.2 C) 98.9 F (37.2 C) 98.9 F (37.2 C)    TempSrc: Oral Oral Oral Oral  SpO2: 98% 98% 99% 100%  Weight:      Height:       Body mass index is 31.31 kg/m.  Physical Exam  Constitutional: He is oriented to person, place, and time. He appears well-developed and well-nourished.  Resting comfortably in bed.   HENT:  Mouth/Throat: No oropharyngeal exudate.  Eyes: Pupils are equal, round, and reactive to light. No scleral icterus.  Cardiovascular: Normal rate and regular rhythm.  LVAD hum present. No native heart tones heard otherwise.   Pulmonary/Chest: Effort normal and breath sounds normal. No respiratory distress.  Abdominal: Soft. Bowel sounds are normal. He exhibits no distension.  Musculoskeletal:  Right #2 toe surgically absent. Sutures in place without drainage. Clean and dry. No warmth/swelling or erythema appreciated.   Neurological: He is alert and oriented to person, place, and time.  Skin: Skin is warm and dry.  Psychiatric: He has a normal mood and affect. Thought content normal.    Lab Results Lab Results  Component Value  Date   WBC 5.0 06/11/2017   HGB 8.2 (L) 06/11/2017   HCT 25.9 (L) 06/11/2017   MCV 86.3 06/11/2017   PLT 138 (L) 06/11/2017    Lab Results  Component Value Date   CREATININE 0.93 06/11/2017   BUN 8 06/11/2017   NA 133 (L) 06/11/2017   K 4.2 06/11/2017   CL 102 06/11/2017   CO2 26 06/11/2017    Lab Results  Component Value Date   ALT 25 06/06/2017   AST 32 06/06/2017   ALKPHOS 61 06/06/2017   BILITOT 1.3 (H) 06/06/2017     Microbiology: BCx 06/06/17 >> 2/2 MSSA  BCx 06/08/17 >> NG @ 3d   ASSESSMENT: 62 y.o. male with LVAD and ICD admitted with sepsis/fever. Found to have MSSA in 2/2 sets of blood cultures drawn 06/06/17. Source presumably toe which was found to have osteomyelitis but great concern given that he has multiple cardiac devices at risk for endocarditis. It was decided to add rifampin for dual therapy due to this concern. His blood cultures cleared after 48 h on  cefazolin and he is now POD 3 from amputation of the 2nd toe on right foot (Dr. Logan Bores).  He is scheduled to have TEE today so we can better determine length of therapy following bacteremia - 2 vs 6 weeks with IV therapy.   PLAN: 1. MSSA Bacteremia = continue cefazolin  rifampin. Duration of therapy pending. Plan for 2 week minimum (06/09/17 for Day 1). OK to place PICC vs midline catheter to administer. TEE this afternoon.   2. Medication Monitoring = check LFTs in AM while on rifampin.   3. Osteomyelitis Right Toe = s/p amputation. Site clean and dry. Continue local wound care and ambulation/PT as able.    Rexene Alberts, MSN, NP-C [facility] for Infectious Disease [facility] Cell: [phone] Pager: [phone]  06/11/2017  9:08 AM

## 2017-06-11 NOTE — Progress Notes (Addendum)
Postoperative shoe ordered for patient to ambulate with per notes from podiatry Dr.Evans. However patient states he already has one up in the closet to use. Instructed him to keep at bedside to use.

## 2017-06-11 NOTE — Progress Notes (Signed)
ANTICOAGULATION CONSULT NOTE  Pharmacy Consult for Warfarin Indication: LVAD  Allergies  Allergen Reactions  . Metformin And Related Nausea And Vomiting    *Only the extended release* (does this)  . Niacin And Related Other (See Comments)    REACTION IS SIDE EFFECT "SEVERE" FLUSHING   Patient Measurements: Height: 6\' 1"  (185.4 cm) Weight: 237 lb 7 oz (107.7 kg) IBW/kg (Calculated) : 79.9  Vital Signs: Temp: 98 F (36.7 C) (06/10 1322) Temp Source: Oral (06/10 1322) BP: 112/98 (06/10 1333) Pulse Rate: 77 (06/10 1333)  Labs: Recent Labs    06/09/17 0353 06/10/17 0147 06/11/17 0500  HGB 8.3* 8.3* 8.2*  HCT 26.3* 26.4* 25.9*  PLT 118* 109* 138*  LABPROT 21.0* 28.7* 33.4*  INR 1.83 2.73 3.31  HEPARINUNFRC <0.10*  --   --   CREATININE 1.07 1.09 0.93    Estimated Creatinine Clearance: 106 mL/min (by C-G formula based on SCr of 0.93 mg/dL).  Assessment: 62 y/o M presents to the ED with sepsis, hx of LVAD pt on warfarin PTA with lower goal due to hx GIB. INR rising quickly to 3.31, likely from effects of rifampin.  PTA warfarin dose was 5mg  daily.   Hgb trending down slightly to 8.2. No s/sx of bleeding. S/p toe amputation 6/7. Also on rifampin due to MSSA in setting of LVAD which will increase patient's requirements of warfarin, this can be delayed 1-2 weeks before full effects are seen. INR will need to be managed closely as outpatient.    Goal of Therapy:  INR 2-2.5 (lower goal with hx GIB) Monitor platelets by anticoagulation protocol: Yes   Plan:  No Coumadin tonight. Monitor daily INR  Reece LeaderJessica Meghanne Pletz, Colon Flatteryharm D, BCPS, [facility] Clinical Pharmacist Pager [phone]  06/11/2017 1:59 PM

## 2017-06-12 ENCOUNTER — Encounter (HOSPITAL_COMMUNITY): Payer: Self-pay | Admitting: Cardiology

## 2017-06-12 LAB — CBC
HEMATOCRIT: 28 % — AB (ref 39.0–52.0)
Hemoglobin: 8.8 g/dL — ABNORMAL LOW (ref 13.0–17.0)
MCH: 27.1 pg (ref 26.0–34.0)
MCHC: 31.4 g/dL (ref 30.0–36.0)
MCV: 86.2 fL (ref 78.0–100.0)
PLATELETS: 160 10*3/uL (ref 150–400)
RBC: 3.25 MIL/uL — ABNORMAL LOW (ref 4.22–5.81)
RDW: 17.6 % — ABNORMAL HIGH (ref 11.5–15.5)
WBC: 6 10*3/uL (ref 4.0–10.5)

## 2017-06-12 LAB — GLUCOSE, CAPILLARY
Glucose-Capillary: 265 mg/dL — ABNORMAL HIGH (ref 65–99)
Glucose-Capillary: 208 mg/dL — ABNORMAL HIGH (ref 65–99)

## 2017-06-12 LAB — PROTIME-INR
INR: 2.81
PROTHROMBIN TIME: 29.4 s — AB (ref 11.4–15.2)

## 2017-06-12 LAB — LACTATE DEHYDROGENASE: LDH: 172 U/L (ref 98–192)

## 2017-06-12 MED ORDER — TRAMADOL HCL 50 MG PO TABS: 50.0000 mg | ORAL_TABLET | Freq: Four times a day (QID) | ORAL | 0 refills | Status: DC | PRN

## 2017-06-12 MED ORDER — POTASSIUM CHLORIDE CRYS ER 20 MEQ PO TBCR: 20.0000 meq | EXTENDED_RELEASE_TABLET | Freq: Every day | ORAL | 6 refills | Status: AC

## 2017-06-12 MED ORDER — IRON 325 (65 FE) MG PO TABS
1.0000 | ORAL_TABLET | Freq: Two times a day (BID) | ORAL | 6 refills | Status: AC
Start: 2017-06-12 — End: ?

## 2017-06-12 MED ORDER — LOSARTAN POTASSIUM 50 MG PO TABS: 50.0000 mg | ORAL_TABLET | Freq: Every day | ORAL | 6 refills | Status: DC

## 2017-06-12 MED ORDER — LOSARTAN POTASSIUM 50 MG PO TABS
50.0000 mg | ORAL_TABLET | Freq: Every day | ORAL | Status: DC
  Administered 2017-06-12: 50 mg via ORAL
  Filled 2017-06-12: qty 1

## 2017-06-12 MED ORDER — HEPARIN SOD (PORK) LOCK FLUSH 100 UNIT/ML IV SOLN
250.0000 [IU] | INTRAVENOUS | Status: AC | PRN
  Administered 2017-06-12: 250 [IU]

## 2017-06-12 MED ORDER — TORSEMIDE 20 MG PO TABS: 40.0000 mg | ORAL_TABLET | Freq: Every day | ORAL | 6 refills | Status: DC

## 2017-06-12 MED ORDER — CEFAZOLIN IV (FOR PTA / DISCHARGE USE ONLY): 2.0000 g | Freq: Three times a day (TID) | INTRAVENOUS | 0 refills | Status: DC

## 2017-06-12 MED ORDER — SERTRALINE HCL 25 MG PO TABS: 25.0000 mg | ORAL_TABLET | Freq: Every day | ORAL | 6 refills | Status: DC

## 2017-06-12 MED ORDER — LOSARTAN POTASSIUM 50 MG PO TABS: 50.0000 mg | ORAL_TABLET | Freq: Two times a day (BID) | ORAL | 6 refills | Status: AC

## 2017-06-12 NOTE — Care Management Note (Signed)
Case Management Note  Patient Details  Name: Dalton RainwaterLarry D [name] MRN: [id number] Date of Birth: [birth year]  Subjective/Objective:  Pt admitted with MSSA bacteremia                  Action/Plan:  PTA independent from home with family.  Pt informed CM that he has a PCP and denied barriers to obtaining/paying for medications.  Pt will discharge home on IV antibiotics - [facility] and [facility] accepted.   Expected Discharge Date:  06/12/17               Expected Discharge Plan:  Home w Home Health Services  In-House Referral:     Discharge planning Services  CM Consult  Post Acute Care Choice:    Choice offered to:     DME Arranged:    DME Agency:     HH Arranged:  RN HH Agency:  [facility]  Status of Service:  In process, will continue to follow  If discussed at Long Length of Stay Meetings, dates discussed:    Additional Comments: 06/12/2017 Pt to discharge home today with IV antibiotics.  [facility] aware of discharge today Cherylann ParrClaxton, Jostin Rue S, RN 06/12/2017, 11:06 AM

## 2017-06-12 NOTE — Progress Notes (Addendum)
[facility]  Date of Admission:  06/06/2017   Total days of antibiotics 7        Day 6 Cefazolin          Patient ID: [name] is a 62 y.o. male with  Principal Problem:   MSSA bacteremia Active Problems:   Septic shock (HCC)   Osteomyelitis of toe (HCC)   LVAD (left ventricular assist device) present (HCC)   . atorvastatin  80 mg Oral q1800  . danazol  100 mg Oral BID  . ezetimibe  10 mg Oral Daily  . feeding supplement (ENSURE ENLIVE)  237 mL Oral BID BM  . ferrous sulfate  325 mg Oral BID WC  . insulin aspart  0-15 Units Subcutaneous TID WC  . insulin aspart  0-5 Units Subcutaneous QHS  . insulin aspart protamine- aspart  12 Units Subcutaneous BID WC  . losartan  50 mg Oral Daily  . mouth rinse  15 mL Mouth Rinse BID  . metoCLOPramide  5 mg Oral TID AC  . pantoprazole  40 mg Oral BID  . potassium chloride  20 mEq Oral Daily  . rifampin  300 mg Oral Q12H  . sertraline  25 mg Oral Daily  . sildenafil  20 mg Oral TID  . sodium chloride flush  10-40 mL Intracatheter Q12H  . sodium chloride flush  10-40 mL Intracatheter Q12H  . sodium chloride flush  3 mL Intravenous Q12H  . spironolactone  12.5 mg Oral Daily  . torsemide  40 mg Oral Daily  . Warfarin - Pharmacist Dosing Inpatient   Does not apply q1800    Allergies  Allergen Reactions  . Metformin And Related Nausea And Vomiting    *Only the extended release* (does this)  . Niacin And Related Other (See Comments)    REACTION IS SIDE EFFECT "SEVERE" FLUSHING   Lab Results Lab Results  Component Value Date   WBC 6.0 06/12/2017   HGB 8.8 (L) 06/12/2017   HCT 28.0 (L) 06/12/2017   MCV 86.2 06/12/2017   PLT 160 06/12/2017    Lab Results  Component Value Date   CREATININE 0.93 06/11/2017   BUN 8 06/11/2017   NA 133 (L) 06/11/2017   K 4.2 06/11/2017   CL 102 06/11/2017   CO2 26 06/11/2017    Lab Results  Component Value Date   ALT 25 06/06/2017   AST 32 06/06/2017     ALKPHOS 61 06/06/2017   BILITOT 1.3 (H) 06/06/2017     Microbiology: BCx 06/06/17 >> 2/2 MSSA  BCx 06/08/17 >> NG @ 3d   ASSESSMENT: 62 y.o. male with LVAD and ICD admitted with sepsis/fever. Found to have MSSA in 2/2 sets of blood cultures drawn 06/06/17. Source presumably toe which was found to have osteomyelitis but great concern given that he has multiple cardiac devices at risk for endocarditis. It was decided to add rifampin for dual therapy until TEE was obtained on 06/11/17 --> no signs of endocarditis of ICD lead or valves. LVAD appears to be working as expected. Stop Rifampin. His blood cultures cleared after 48 h on cefazolin and he is now POD 4 following amputation of the 2nd toe on right foot (Dr. Logan BoresEvans).  Residual tissue from foot (+) MSSA pointing towards toe as source. Considering what may be residual infected margins from wound will treat with 2 weeks IV cefazolin for secondary bacteremia and finish course with cephalexin  500 mg QID (to start 06/23/17) for an additional 2 weeks. He will need close monitoring and surveillance blood cultures 7-10 days after completing antibiotic course. He has follow up scheduled with [facility].   Plan:  OPAT ORDERS:  Diagnosis: MSSA bacteremia 2/2 acute osteomyelitis   Culture Result: MSSA   Allergies  Allergen Reactions  . Metformin And Related Nausea And Vomiting    *Only the extended release* (does this)  . Niacin And Related Other (See Comments)    REACTION IS SIDE EFFECT "SEVERE" FLUSHING    Discharge antibiotics: Cefazolin 2 gm IV q8h  Duration: 2 weeks   End Date: 06/22/2017  [facility] Care and Maintenance Per Protocol _x_ Please pull PIC at completion of IV antibiotics __ Please leave PIC in place until doctor has seen patient or been notified  Labs weekly while on IV antibiotics: _x_ CBC with differential _x_ BMP  Fax weekly labs to [phone]  Clinic Follow Up Appt: June 20th @ 3:30 with Dr. Marjie Skiff, MSN,  NP-C [facility] Cell: [phone] Pager: [phone]  06/12/2017  8:44 AM

## 2017-06-12 NOTE — Progress Notes (Signed)
LVAD Coordinator Rounding Note:  Admitted 06/06/17 from ED for sepsis.  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 Maren BeachVanTrigt under Destination Therapy criteria due to current smoking status.  Right 2nd toe amputated on Friday.  Vital signs: Temp: 98.5 HR: 79 Doppler Pressure:  80 Automatic BP:  115/95 (95) O2 Sat: 98% on RA Wt: 240>242>237>234 lbs  HVAD Interrogation Reveals: Speed: 2700 Flow: 4.6 Power: 4.2w Peak/Trough: 6.8/3.2 Alarms: none Flow alarm limit: 2.0 Power alarm limit: 6.5 Hematocrit: 28 Suction alarm: on Lavare cycle: on  Drive Line:  Left abdominal sorbaview dressing dry and intact; anchor intact and accurately applied. Weekly dressings; bedside RN may change dressing has needed. Changed yesterday by pts Dad. Next dressing change due 06/18/17.  Labs:  LDH trend: 159>181>172  INR trend: 2.35>1.98>3.31>2.81  WBC: 11.5>9.1>5.9>5.0 Anticoagulation Plan: -INR Goal: 2.0 - 2.5 -ASA Dose: no ASA due to hx of GI bleed  Device: - St Jude single lead -Therapies: on at 200 bpm  Adverse Events on VAD: 05/31/16 - Controller power-up associated with pump start event. Controller change out performed.  06/01/16 - Controller pins lubricated per [employer] Jude reps  06/17/16 - Controller power-up associated with pump start event while changing power source 06/22/16 - Controller pins re-lubricated per [employer] reps   Plan/Recommendations:   1. Pt can d/c home today. 2. Please call VAD pager with any VAD equipment or drive line issues  Carlton AdamSarah Herbert RN, VAD Coordinator 24/7 VAD Pager: [phone]

## 2017-06-12 NOTE — Anesthesia Postprocedure Evaluation (Signed)
Anesthesia Post Note  Patient: [name]  Procedure(s) Performed: TRANSESOPHAGEAL ECHOCARDIOGRAM (TEE) (N/A )     Patient location during evaluation: PACU Anesthesia Type: MAC Level of consciousness: awake and alert Pain management: pain level controlled Vital Signs Assessment: post-procedure vital signs reviewed and stable Respiratory status: spontaneous breathing, nonlabored ventilation, respiratory function stable and patient connected to nasal cannula oxygen Cardiovascular status: stable and blood pressure returned to baseline Postop Assessment: no apparent nausea or vomiting Anesthetic complications: no    Last Vitals:  Vitals:   06/12/17 0710 06/12/17 1153  BP: 99/75 (!) 115/95  Pulse:    Resp: 12   Temp: 36.8 C 36.9 C  SpO2:  98%    Last Pain:  Vitals:   06/12/17 1153  TempSrc: Oral  PainSc: 0-No pain                 Page Pucciarelli S

## 2017-06-12 NOTE — Progress Notes (Signed)
Physical Therapy Treatment Patient Details Name: Dalton RainwaterLarry D [name] MRN: [id number] DOB: [birth year] Today's Date: 06/12/2017    History of Present Illness 62 y.o. male with septic schock s/p ulceration of R 2nd toe. 6/7 toe amputation. PMH includes: DM, PVD, Osteo, LVAD, AICD, Arthritis, Dyspnea on Exertion, Heart murmur, CAD, CHF, HTN, HLD, Barretts Disease, Cerebrovascular disease, cardiomyopathy    PT Comments    Pt sitting EOB upon PT arrival and reporting readiness to D/C from hospital. Pt ambulated 500 ft with supervision with 1 episode of LOB to L likely due to post op shoe on RLE and no shoe on LLE. Pt reports generalized pain with no pain in R foot. Pt reports that he does not want to use a RW or SPC at home and would benefit from further PT to address balance deficits.  Follow Up Recommendations  Home health PT;Supervision - Intermittent(HHPT For balance)     Equipment Recommendations  None recommended by PT    Recommendations for Other Services       Precautions / Restrictions Precautions Precautions: Fall Precaution Comments: HVAD Other Brace/Splint: post op shoe on R foot Restrictions Weight Bearing Restrictions: Yes RLE Weight Bearing: Weight bearing as tolerated    Mobility  Bed Mobility Overal bed mobility: Modified Independent Bed Mobility: Supine to Sit     Supine to sit: HOB elevated;Modified independent (Device/Increase time)     General bed mobility comments: Inc time and used bed rail  Transfers Overall transfer level: Modified independent Equipment used: Rolling walker (2 wheeled) Transfers: Sit to/from Stand Sit to Stand: Modified independent (Device/Increase time)         General transfer comment: Increased time  Ambulation/Gait Ambulation/Gait assistance: Supervision Ambulation Distance (Feet): 500 Feet Assistive device: Rolling walker (2 wheeled) Gait Pattern/deviations: Step-through pattern;Decreased stride length;Trunk flexed Gait  velocity: decreased   General Gait Details: Cues for standing upright in RW and to keep RW on ground. Pt demonstrated 1 episode of instability with LOB to L with pt recovering balance without physical assistance. Pt reports feeling off balance due to post op shoe on RLE and no shoe on LLE.    Stairs             Wheelchair Mobility    Modified Rankin (Stroke Patients Only)       Balance   Sitting-balance support: Feet supported;No upper extremity supported Sitting balance-Leahy Scale: Good       Standing balance-Leahy Scale: Good Standing balance comment: Pt able to stand and donn underwear without UE support                            Cognition Arousal/Alertness: Awake/alert Behavior During Therapy: WFL for tasks assessed/performed Overall Cognitive Status: Within Functional Limits for tasks assessed                                 General Comments: Pt reluctant to use RW or SPC but states that he does not wish to fall      Exercises      General Comments General comments (skin integrity, edema, etc.): Pt on HVAD battery upon PT arrival      Pertinent Vitals/Pain Pain Score: 6  Pain Location: Generalized Pain Descriptors / Indicators: Constant Pain Intervention(s): Limited activity within patient's tolerance;Monitored during session    Home Living  Prior Function            PT Goals (current goals can now be found in the care plan section) Acute Rehab PT Goals Patient Stated Goal: go home PT Goal Formulation: With patient Time For Goal Achievement: 06/26/17 Potential to Achieve Goals: Good Progress towards PT goals: Progressing toward goals    Frequency    Min 3X/week      PT Plan Current plan remains appropriate    Co-evaluation              AM-PAC PT "6 Clicks" Daily Activity  Outcome Measure  Difficulty turning over in bed (including adjusting bedclothes, sheets and  blankets)?: None Difficulty moving from lying on back to sitting on the side of the bed? : A Little Difficulty sitting down on and standing up from a chair with arms (e.g., wheelchair, bedside commode, etc,.)?: A Little Help needed moving to and from a bed to chair (including a wheelchair)?: None Help needed walking in hospital room?: A Little Help needed climbing 3-5 steps with a railing? : A Little 6 Click Score: 20    End of Session Equipment Utilized During Treatment: Gait belt Activity Tolerance: Patient tolerated treatment well Patient left: in bed;with call bell/phone within reach         Time: 1111-1122 PT Time Calculation (min) (ACUTE ONLY): 11 min  Charges:  $Gait Training: 8-22 mins                    G Codes:      Gabe Odean Fester, SPT   [employer] 06/12/2017, 12:15 PM

## 2017-06-12 NOTE — Progress Notes (Signed)
PHARMACY CONSULT NOTE FOR:  OUTPATIENT  PARENTERAL ANTIBIOTIC THERAPY (OPAT)  Indication: MSSA bacteremia Regimen: Cefazolin 2g IV every 8 hours End date: 06/22/17  IV antibiotic discharge orders are pended. To discharging provider:  please sign these orders via discharge navigator,  Select New Orders & click on the button choice - Manage This Unsigned Work.     Thank you for allowing pharmacy to be a part of this patient's care.  Rolley SimsMartin, Askia Hazelip Ann 06/12/2017, 10:47 AM

## 2017-06-12 NOTE — Progress Notes (Addendum)
Advanced Heart Failure VAD Team Note  PCP-Cardiologist: No primary care provider on file.   Subjective:    Admitted with sepsis/osteomyelitis. BCx 06/06/17 2/2 MSSA bacteremia. ID following.   Underwent toe amputation on 6/7  Denies SOB. Wants to go home.   HVAD parameters RPM 2700 Flow 5.2  Power 5  Suction On.    Objective:    Vital Signs:   Temp:  [97.8 F (36.6 C)-99.2 F (37.3 C)] 97.8 F (36.6 C) (06/11 0335) Pulse Rate:  [74-90] 77 (06/11 0335) Resp:  [9-20] 13 (06/11 0335) BP: (96-126)/(79-98) 105/91 (06/11 0335) SpO2:  [96 %-100 %] 99 % (06/11 0335) Weight:  [234 lb 4.8 oz (106.3 kg)-237 lb 7 oz (107.7 kg)] 234 lb 4.8 oz (106.3 kg) (06/11 0335) Last BM Date: 06/11/17 Mean arterial Pressure 80-90s   Intake/Output:   Intake/Output Summary (Last 24 hours) at 06/12/2017 0734 Last data filed at 06/12/2017 0400 Gross per 24 hour  Intake 1387.5 ml  Output 2650 ml  Net -1262.5 ml    Physical Exam    Physical Exam: GENERAL:  no acute distress. In bed.  HEENT: normal  NECK: Supple, JVP 6-7 Carotids 2+ bilaterally, no bruits.  No lymphadenopathy or thyromegaly appreciated.   CARDIAC:  Mechanical heart sounds with LVAD hum present.  LUNGS:  Clear to auscultation bilaterally.  ABDOMEN:  Soft, round, nontender, positive bowel sounds x4.     LVAD exit site: Dressing dry and intact.  No erythema or drainage.  Stabilization device present and accurately applied.  Driveline dressing is being changed daily per sterile technique. EXTREMITIES:  Warm and dry, no cyanosis, clubbing, rash or edema. R foot dressing.  RUE PICC  NEUROLOGIC:  Alert and oriented x 4.  Gait steady.  No aphasia.  No dysarthria.  Affect pleasant.       Telemetry   NSR 70   EKG    No new tracings.    Labs   Basic Metabolic Panel: Recent Labs  Lab 06/08/17 0359 06/08/17 2037 06/09/17 0353 06/10/17 0147 06/11/17 0500  NA 132* 135 133* 134* 133*  K 3.7 4.0 4.2 4.6 4.2  CL 100* 100*  100* 99* 102  CO2 26 26 26 26 26   GLUCOSE 148* 144* 141* 107* 189*  BUN 13 9 10 9 8   CREATININE 1.13 1.06 1.07 1.09 0.93  CALCIUM 8.2* 8.3* 8.3* 8.4* 8.1*    Liver Function Tests: Recent Labs  Lab 06/06/17 2237  AST 32  ALT 25  ALKPHOS 61  BILITOT 1.3*  PROT 7.3  ALBUMIN 2.9*   No results for input(s): LIPASE, AMYLASE in the last 168 hours. No results for input(s): AMMONIA in the last 168 hours.  CBC: Recent Labs  Lab 06/06/17 2237  06/08/17 2037 06/09/17 0353 06/10/17 0147 06/11/17 0500 06/12/17 0347  WBC 11.5*   < > 5.2 4.6 3.7* 5.0 6.0  NEUTROABS 10.6*  --   --   --   --   --   --   HGB 9.6*   < > 8.4* 8.3* 8.3* 8.2* 8.8*  HCT 29.6*   < > 26.8* 26.3* 26.4* 25.9* 28.0*  MCV 84.8   < > 87.0 86.8 86.0 86.3 86.2  PLT 139*   < > 119* 118* 109* 138* 160   < > = values in this interval not displayed.   INR: Recent Labs  Lab 06/08/17 2037 06/09/17 0353 06/10/17 0147 06/11/17 0500 06/12/17 0347  INR 1.63 1.83 2.73 3.31 2.81   Other  results:  EKG:   Imaging   [place] Ekg Site Rite  Result Date: 06/11/2017 If Site Rite image not attached, placement could not be confirmed due to current cardiac rhythm.    Medications:     Scheduled Medications: . atorvastatin  80 mg Oral q1800  . danazol  100 mg Oral BID  . ezetimibe  10 mg Oral Daily  . feeding supplement (ENSURE ENLIVE)  237 mL Oral BID BM  . ferrous sulfate  325 mg Oral BID WC  . insulin aspart  0-15 Units Subcutaneous TID WC  . insulin aspart  0-5 Units Subcutaneous QHS  . insulin aspart protamine- aspart  12 Units Subcutaneous BID WC  . mouth rinse  15 mL Mouth Rinse BID  . metoCLOPramide  5 mg Oral TID AC  . pantoprazole  40 mg Oral BID  . potassium chloride  20 mEq Oral Daily  . rifampin  300 mg Oral Q12H  . sertraline  25 mg Oral Daily  . sildenafil  20 mg Oral TID  . sodium chloride flush  10-40 mL Intracatheter Q12H  . sodium chloride flush  10-40 mL Intracatheter Q12H  . sodium chloride  flush  3 mL Intravenous Q12H  . spironolactone  12.5 mg Oral Daily  . torsemide  40 mg Oral Daily  . Warfarin - Pharmacist Dosing Inpatient   Does not apply q1800    Infusions: . sodium chloride Stopped (06/08/17 1848)  . sodium chloride 10 mL/hr at 06/10/17 0615  . sodium chloride Stopped (06/08/17 1847)  .  ceFAZolin (ANCEF) IV Stopped (06/12/17 0414)  . lactated ringers 0  (06/10/17 0614)    PRN Medications: sodium chloride, sodium chloride, acetaminophen **OR** acetaminophen, acetaminophen, ondansetron (ZOFRAN) IV, oxyCODONE-acetaminophen, sodium chloride, sodium chloride flush, sodium chloride flush, sodium chloride flush, traMADol, white petrolatum   Patient Profile   [name] is a 62 y.o. malesmoker with history of PAD, CAD, and ischemic cardiomyopathy s/p HVAD placement on 05/30/16.   Admitted 06/06/17 with septic shock with osteomyelitis in R great toe.   Assessment/Plan:    1. MSSA sepsis/septic shock due to R great toe osteo - Resolved, MAPs responded well to fluid bolus.   - BCx 06/06/17 2/2 MSSA. ID following.  -Remains on Ancef and rifampin. Per ID continue Ancef for 2 week with day 1 on 6/8. Stop rifampin. Follow up In 2 weeks with ID.  Afebrile - s/p right great toe amputation 6/7 -TEE without vegation on 6/10 2. Chronic systolic ZOX:[id number]:Ischemic cardiomyopathy - Echo in 3/18 with EF 20-25%. St Jude ICD.  - s/p HVAD placement. Lavare cycle is on - VAD interrogated personally. Parameters stable. - LDH 172.  - Losartan on hold with soft BP. Continue spiro 12.5 - INR 2.8 on  Warfarin. - stop rifampin per ID.  3. CAD:  - 3 vessel CAD, s/p stent to proximal LAD and PTCA to subtotally occluded large OM1 in 1/18.  -No s/s ischemia. Continue atorvastatin 80 mg daily.  4. PAD: - Stable claudication, notes with moderate walking on left. Left fem-pop bypass occluded. Now with right 2nd toe ulceration and suspected osteomyelitis.  - s/p amputation on R great toe  on 6/7 -  RLE Duplex 06/07/17 with patent gradt from previous fem-bop bypass. Unreliable waveforms with LVAD - ABIs 06/07/17 with non-compressible L/RLE arteries. Unable to obtain TBIs due to VAD waveforms.  5. Pulmonary arterial HTN:  - Continue Revatio 20 mg TID as pressure tolerated.  - No change to current  plan.   6. Smoking:  - Encouraged complete cessation. No change 7. Upper GI bleeding: 7/18, small bowel AVMs. ASA was decreased to 81 daily. Warfarin INR goal is now 2-2.5. He started monthly octreotide injections but had profuse diarrhea with octreotide after 1st injection and after 2nd injection with decreased dose. He is tolerating danazol without problems. 12/18 had more melena in setting of high INR =>due to vigorous epistaxis versus AVM bleeding. ASA was stopped.  - No bleeding currently. Hgb 8.8 stable.  - He stay off ASA 81 and will continue INR goal 2-2.5 for now. INR 2.8 - Discussed with pharmacy.  - Continue danazol.  8. L1 Burst Fracture:  - Stable. Once ambulatory needs to wear brace when walking.  9. Diabetes:  - Cover with home NPH and sliding scale. No change.   Home today with [facility]. Will need ancef for 2 weeks via PICC. He will not need rifampin per ID.   Plan to check INR on Friday at the clinic.   Length of Stay: 6  [name], NP  7:34 AM   VAD Team --- VAD ISSUES ONLY--- Pager [phone] (7am - 7am)  Advanced Heart Failure Team  Pager [phone] (M-F; 7a - 4p)   Patient seen with NP, agree with the above note.  TEE yesterday showed no vegetation on valves or ICD.  ID plans for Ancef x 2 wks and off rifampin. He now has PICC line in place. LVAD parameters stable on interrogation. MAP now in 90s.   On exam, no JVD, chronic venous stasis changes lower legs, normal LVAD sounds.   I think that he can go home today.  He can restart his home losartan with BP running higher.  He will continue his previous home meds with the addition of sertraline + Ancef x 2 wks.   Will need followup with ID and LVAD clinic.   Marca Ancona 06/12/2017 8:08 AM

## 2017-06-12 NOTE — Discharge Summary (Addendum)
[facility] Discharge Note  Discharge Summary   Patient ID: [name] MRN: [id number], DOB/AGE: 1955/06/06 62 y.o. Admit date: 06/06/2017 D/C date:     06/12/2017   Primary Discharge Diagnoses:  1. MSSA sepsis/septic shock due to R great toe osteo 2. Chronic systolic JFH:LKTGYBWL cardiomyopathy 3. CAD 4. PAD 5. Pulmonary arterial HTN 6. Smoking 7. Upper GI bleeding 8. L1 Burst Fracture 9. Diabetes  Hospital Course:   [name] is a 62 y.o. male smoker with history of PAD, CAD, and ischemic cardiomyopathy s/p HVAD placementon5/29/18.   Admitted 06/06/17 with septic shock with osteomyelitis in R great toe with fevers and hypotension. Problem based hospital course as followed.   1. MSSA sepsis/septic shock due to R great toe osteo - Resolved, MAPs responded well to fluid bolus.  - BCx 06/06/17 2/2 MSSA. ID followed and made ABX recommendation while in house.  - He finished rifampin and per ID will continue Ancef for 2 week with day 1 on 6/8. Will follow up In 2 weeks with ID. - Pt underwent right great toe amputation 6/7 -TEE without vegation on 6/10, So no need for lifelong ABX at this tpoint.  2. Chronic systolic SLH:TDSKAJGO cardiomyopathy - Echo in 3/18 with EF 20-25%. St Jude ICD.  - s/p HVAD 05/2016. VAD interrogated personally. Parameters stable this admission.  - Meds initially held with hypotension and resumed as tolerated.  3. CAD:  - 3 vessel CAD, s/p stent to proximal LAD and PTCA to subtotally occluded large OM1 in 1/18.  - No s/s of ischemia this admission. Statin continued.  4. PAD: - Stable claudication, notes with moderate walking on left. Left fem-pop bypass occluded. - This admission, underwent amputation on R great toe on 6/7 due to osteomyelitis and sepsis.  -  RLE Duplex 06/07/17 with patent gradt from previous fem-bop bypass. Unreliable waveforms with LVAD - ABIs 06/07/17 with non-compressible L/RLE arteries. Unable to obtain TBIs due to  VAD waveforms.  5. Pulmonary arterial HTN:  - Continue Revatio 20 mg TID as pressure tolerated.  - No change this admission.  6. Smoking: - Encouraged complete cessation. No change.  7. Upper GI bleeding: 7/18, small bowel AVMs. ASA was decreased to 81 daily. Warfarin INR goal is now 2-2.5. He started monthly octreotide injections but had profuse diarrhea with octreotide after 1st injection and after 2nd injection with decreased dose. He is tolerating danazol without problems. 12/18 had more melena in setting of high INR =>due to vigorous epistaxis versus AVM bleeding. ASA was stopped. - Hgb remained relatively stable this admission. ASA has been stopped.  - INR goal 2-2.5 for now. Continue danazol.  8. L1 Burst Fracture:  - Stable. Wore brace with ambulation this admission.  9. Diabetes:  - Cover with home NPH and sliding scale. No change.   Examined am of 06/12/17 and thought stable for home per VAD team, MD, and ID. Follow up arranged as below. [facility] will provide ABX.   HVAD parameters 06/12/17 RPM 2700 Flow 5.2  Power 5  Suction On.   Discharge Weight Range: 234 lbs  Discharge Vitals: Blood pressure 99/75, pulse 77, temperature 98.2 F (36.8 C), temperature source Oral, resp. rate 12, height 6' 1"  (1.854 m), weight 234 lb 4.8 oz (106.3 kg), SpO2 99 %.  Labs: Lab Results  Component Value Date   WBC 6.0 06/12/2017   HGB 8.8 (L) 06/12/2017   HCT 28.0 (L) 06/12/2017   MCV 86.2 06/12/2017   PLT  160 06/12/2017    Recent Labs  Lab 06/06/17 2237  06/11/17 0500  NA 129*   < > 133*  K 4.2   < > 4.2  CL 95*   < > 102  CO2 22   < > 26  BUN 15   < > 8  CREATININE 1.82*   < > 0.93  CALCIUM 8.3*   < > 8.1*  PROT 7.3  --   --   BILITOT 1.3*  --   --   ALKPHOS 61  --   --   ALT 25  --   --   AST 32  --   --   GLUCOSE 202*   < > 189*   < > = values in this interval not displayed.   Lab Results  Component Value Date   CHOL 103 03/10/2016   HDL 49 03/10/2016   LDLCALC 45  03/10/2016   TRIG 43 03/10/2016   BNP (last 3 results) No results for input(s): BNP in the last 8760 hours.  ProBNP (last 3 results) No results for input(s): PROBNP in the last 8760 hours.   Diagnostic Studies/Procedures   [place] Ekg Site Rite  Result Date: 06/11/2017 If Site Rite image not attached, placement could not be confirmed due to current cardiac rhythm.   Discharge Medications   Allergies as of 06/12/2017      Reactions   Metformin And Related Nausea And Vomiting   *Only the extended release* (does this)   Niacin And Related Other (See Comments)   REACTION IS SIDE EFFECT "SEVERE" FLUSHING      Medication List    STOP taking these medications   amoxicillin-clavulanate 875-125 MG tablet Commonly known as:  AUGMENTIN     TAKE these medications   atorvastatin 80 MG tablet Commonly known as:  LIPITOR Take 1 tablet (80 mg total) by mouth daily.   blood glucose meter kit and supplies Ultra Blue Kit Dispense based on patient and insurance preference. Use up to four times daily as directed. (FOR ICD-9 250.00, 250.01).   calcium carbonate 750 MG chewable tablet Commonly known as:  TUMS EX Chew 2 tablets by mouth as needed for heartburn.   ceFAZolin IVPB Commonly known as:  ANCEF Inject 2 g into the vein every 8 (eight) hours for 11 days. Indication:  Osteomyelitis Last Day of Therapy:  06/23/17 Labs - Once weekly:  CBC/D and BMP, Labs - Every other week:  ESR and CRP   danazol 100 MG capsule Commonly known as:  DANOCRINE Take 1 capsule (100 mg total) by mouth 2 (two) times daily.   ezetimibe 10 MG tablet Commonly known as:  ZETIA Take 1 tablet (10 mg total) by mouth daily.   insulin NPH-regular Human (70-30) 100 UNIT/ML injection Commonly known as:  NOVOLIN 70/30 Inject 12 Units into the skin 2 (two) times daily.   Iron 325 (65 Fe) MG Tabs Take 1 tablet (325 mg total) by mouth 2 (two) times daily with a meal. What changed:  when to take this   losartan  50 MG tablet Commonly known as:  COZAAR Take 1 tablet (50 mg total) by mouth 2 (two) times daily.   metoCLOPramide 5 MG tablet Commonly known as:  REGLAN Take 1 tablet (5 mg total) by mouth 3 (three) times daily before meals. What changed:  when to take this   oxyCODONE-acetaminophen 5-325 MG tablet Commonly known as:  PERCOCET/ROXICET Take 1-2 tablets by mouth every 8 (eight) hours as  needed for moderate pain.   pantoprazole 40 MG tablet Commonly known as:  PROTONIX Take 1 tablet (40 mg total) by mouth 2 (two) times daily.   potassium chloride SA 20 MEQ tablet Commonly known as:  K-DUR,KLOR-CON Take 1 tablet (20 mEq total) by mouth daily. What changed:  how much to take   sertraline 25 MG tablet Commonly known as:  ZOLOFT Take 1 tablet (25 mg total) by mouth daily.   sildenafil 20 MG tablet Commonly known as:  REVATIO Take 1 tablet (20 mg total) by mouth 3 (three) times daily. What changed:  when to take this   spironolactone 25 MG tablet Commonly known as:  ALDACTONE Take 0.5 tablets (12.5 mg total) by mouth daily.   sucralfate 1 g tablet Commonly known as:  CARAFATE Take 1 tablet (1 g total) by mouth 4 (four) times daily -  with meals and at bedtime.   torsemide 20 MG tablet Commonly known as:  DEMADEX Take 2 tablets (40 mg total) by mouth daily. What changed:    how much to take  how to take this  when to take this  additional instructions   traMADol 50 MG tablet Commonly known as:  ULTRAM Take 1 tablet (50 mg total) by mouth every 6 (six) hours as needed for moderate pain. What changed:    how much to take  reasons to take this   warfarin 5 MG tablet Commonly known as:  COUMADIN Take as directed. If you are unsure how to take this medication, talk to your nurse or doctor. Original instructions:  5 mg. Take 1 tablet (5 mg) daily            Home Infusion Instuctions  (From admission, onward)        Start     Ordered   06/12/17 0000  Home  infusion instructions [facility] May follow [place] Dosing Protocol; May administer Cathflo as needed to maintain patency of vascular access device.; Flushing of vascular access device: per [facility] Protocol: 0.9% NaCl pre/post medica...    Question Answer Comment  Instructions May follow [place] Dosing Protocol   Instructions May administer Cathflo as needed to maintain patency of vascular access device.   Instructions Flushing of vascular access device: per [facility] Protocol: 0.9% NaCl pre/post medication administration and prn patency; Heparin 100 u/ml, 11m for implanted ports and Heparin 10u/ml, 54mfor all other central venous catheters.   Instructions May follow [facility] Anaphylaxis Protocol for First Dose Administration in the home: 0.9% NaCl at 25-50 ml/hr to maintain IV access for protocol meds. Epinephrine 0.3 ml IV/IM PRN and Benadryl 25-50 IV/IM PRN s/s of anaphylaxis.   Instructions [facility] Infusion Coordinator (RN) to assist per patient IV care needs in the home PRN.      06/12/17 0813       Durable Medical Equipment  (From admission, onward)        Start     Ordered   06/11/17 1520  Heart failure home health orders  (Heart failure home health orders / Face to face)  Once    Comments:  Heart Failure Follow-up Care:  Verify follow-up appointments per Patient Discharge Instructions. Confirm transportation arranged. Reconcile home medications with discharge medication list. Remove discontinued medications from use. Assist patient/caregiver to manage medications using pill box. Reinforce low sodium food selection Assessments: Vital signs and oxygen saturation at each visit. Assess home environment for safety concerns, caregiver support and availability of low-sodium foods.  Consult Education officer, museum, PT/OT, Dietitian, and CNA based on assessments. Perform comprehensive cardiopulmonary assessment. Notify MD for any change in condition or weight gain of 3 pounds in one day  or 5 pounds in one week with symptoms. Daily Weights and Symptom Monitoring: Ensure patient has access to scales. Teach patient/caregiver to weigh daily before breakfast and after voiding using same scale and record.    Teach patient/caregiver to track weight and symptoms and when to notify Provider. Activity: Develop individualized activity plan with patient/caregiver.   Question Answer Comment  Heart Failure Follow-up Care [facility] at [phone]   Obtain the following labs Basic Metabolic Panel   Lab frequency Other see comments   Fax lab results to [facility] Clinic at [phone]   Diet Low Sodium Heart Healthy   Fluid restrictions: 2000 mL Fluid      06/11/17 1520      Disposition   The patient will be discharged in stable condition to home.  Discharge Instructions    Diet - low sodium heart healthy   Complete by:  As directed    Heart Failure patients record your daily weight using the same scale at the same time of day   Complete by:  As directed    Home infusion instructions [place] May follow [place] Dosing Protocol; May administer Cathflo as needed to maintain patency of vascular access device.; Flushing of vascular access device: per [facility] Protocol: 0.9% NaCl pre/post medica...   Complete by:  As directed    Instructions:  May follow [place] Dosing Protocol   Instructions:  May administer Cathflo as needed to maintain patency of vascular access device.   Instructions:  Flushing of vascular access device: per [facility] Protocol: 0.9% NaCl pre/post medication administration and prn patency; Heparin 100 u/ml, 44m for implanted ports and Heparin 10u/ml, 52mfor all other central venous catheters.   Instructions:  May follow [facility] Anaphylaxis Protocol for First Dose Administration in the home: 0.9% NaCl at 25-50 ml/hr to maintain IV access for protocol meds. Epinephrine 0.3 ml IV/IM PRN and Benadryl 25-50 IV/IM PRN s/s of anaphylaxis.    Instructions:  [facility] Coordinator (RN) to assist per patient IV care needs in the home PRN.   INR  Goal: 1.8 - 2.3   Complete by:  As directed    Goal:  1.8 - 2.3   Increase activity slowly   Complete by:  As directed    Page VAD Coordinator at [phone] for: any VAD alarms, sustained elevations of power >10 watts, sustained drop in Pulse Index <3   Complete by:  As directed    Notify for:   any VAD alarms sustained elevations of power >10 watts sustained drop in Pulse Index <3     Speed Settings:   Complete by:  As directed    Fixed 2700 RPM     Follow-up Information    McLarey DresserMD Follow up on 06/21/2017.   Specialty:  Cardiology Why:  [address] 1300 Contact information: [address].   Specialty:  Home Health Services Why:  Registered Nurse Contact information: [address].   Why:  IV infusion Contact information: [address] [place] [id number] [phone]  Thayer Headings, MD .   Specialty:  Infectious Diseases Contact information: [address] [place] [address] [phone]        [facility] Follow up.   Specialty:  Infectious Diseases Contact information: [place], [place] [id number] [place] [address] [phone]            Duration of Discharge Encounter: Greater than 35 minutes   Signed, Annamaria Helling 06/12/2017, 10:46 AM

## 2017-06-12 NOTE — Progress Notes (Signed)
Discharged home accompanied by father, discharged instructions and prescription given to pt. Belongings taken home.

## 2017-06-12 NOTE — Progress Notes (Signed)
Removed Right IJ with cath tip intact.  Pt tolerated well.  Held pressure for 5 minutes. Pressure dressing applied.  Instructed pt to lay flat for 30 minutes.  Will continue to monitor. Karena Addisonoro, Chanti Golubski T

## 2017-06-12 NOTE — Progress Notes (Signed)
ANTICOAGULATION CONSULT NOTE  Pharmacy Consult for Warfarin Indication: LVAD  Allergies  Allergen Reactions  . Metformin And Related Nausea And Vomiting    *Only the extended release* (does this)  . Niacin And Related Other (See Comments)    REACTION IS SIDE EFFECT "SEVERE" FLUSHING   Patient Measurements: Height: 6\' 1"  (185.4 cm) Weight: 234 lb 4.8 oz (106.3 kg) IBW/kg (Calculated) : 79.9  Vital Signs: Temp: 98.2 F (36.8 C) (06/11 0710) Temp Source: Oral (06/11 0710) BP: 99/75 (06/11 0710) Pulse Rate: 77 (06/11 0335)  Labs: Recent Labs    06/10/17 0147 06/11/17 0500 06/12/17 0347  HGB 8.3* 8.2* 8.8*  HCT 26.4* 25.9* 28.0*  PLT 109* 138* 160  LABPROT 28.7* 33.4* 29.4*  INR 2.73 3.31 2.81  CREATININE 1.09 0.93  --     Estimated Creatinine Clearance: 105.4 mL/min (by C-G formula based on SCr of 0.93 mg/dL).  Assessment: 62 y/o M presents to the ED with sepsis, hx of LVAD pt on warfarin PTA with lower goal due to hx GIB. INR rising quickly to 3.31 > now 2.81, likely from effects of rifampin.  PTA warfarin dose was 5mg  daily.   Hgb trending down slightly to 8.2. No s/sx of bleeding. S/p toe amputation 6/7. Also on rifampin due to MSSA in setting of LVAD which will increase patient's requirements of warfarin, this can be delayed 1-2 weeks before full effects are seen. INR will need to be managed closely as outpatient.    6/11> per discussion with ID, rifampin will stop today.  Goal of Therapy:  INR 2-2.5 (lower goal with hx GIB) Monitor platelets by anticoagulation protocol: Yes   Plan:  Coumadin 5 mg daily at discharge. Will need close INR monitoring until effects of rifampin are washed out.  Jenetta DownerJessica Cantrell Martus, Pharm D, BCPS, [facility] Clinical Pharmacist Pager [phone]  06/12/2017 9:29 AM

## 2017-06-13 ENCOUNTER — Telehealth: Payer: Self-pay | Admitting: Podiatry

## 2017-06-13 ENCOUNTER — Other Ambulatory Visit (HOSPITAL_COMMUNITY): Payer: Self-pay | Admitting: *Deleted

## 2017-06-13 ENCOUNTER — Telehealth (HOSPITAL_COMMUNITY): Payer: Self-pay | Admitting: Pharmacist

## 2017-06-13 DIAGNOSIS — Z95811 Presence of heart assist device: Secondary | ICD-10-CM

## 2017-06-13 DIAGNOSIS — Z95810 Presence of automatic (implantable) cardiac defibrillator: Secondary | ICD-10-CM

## 2017-06-13 DIAGNOSIS — I5022 Chronic systolic (congestive) heart failure: Secondary | ICD-10-CM

## 2017-06-13 DIAGNOSIS — I509 Heart failure, unspecified: Secondary | ICD-10-CM

## 2017-06-13 DIAGNOSIS — I5023 Acute on chronic systolic (congestive) heart failure: Secondary | ICD-10-CM

## 2017-06-13 DIAGNOSIS — K31811 Angiodysplasia of stomach and duodenum with bleeding: Secondary | ICD-10-CM

## 2017-06-13 LAB — AEROBIC/ANAEROBIC CULTURE W GRAM STAIN (SURGICAL/DEEP WOUND)

## 2017-06-13 LAB — AEROBIC/ANAEROBIC CULTURE (SURGICAL/DEEP WOUND)

## 2017-06-13 LAB — CULTURE, BLOOD (ROUTINE X 2)
Culture: NO GROWTH
Culture: NO GROWTH
Special Requests: ADEQUATE
Special Requests: ADEQUATE

## 2017-06-13 MED ORDER — TORSEMIDE 20 MG PO TABS: 40.0000 mg | ORAL_TABLET | Freq: Every day | ORAL | 6 refills | Status: AC

## 2017-06-13 MED ORDER — SPIRONOLACTONE 25 MG PO TABS: 12.5000 mg | ORAL_TABLET | Freq: Every day | ORAL | 6 refills | Status: AC

## 2017-06-13 MED ORDER — PANTOPRAZOLE SODIUM 40 MG PO TBEC: 40.0000 mg | DELAYED_RELEASE_TABLET | Freq: Two times a day (BID) | ORAL | 6 refills | Status: AC

## 2017-06-13 NOTE — Telephone Encounter (Signed)
I informed Byrd HesselbachMaria, RN - [facility] to leave dressing in place until pt is seen next week for post op check. I spoke with pt and told him Dr. Logan BoresEvans wanted to see him next week for 1POV and transferred to C. Sewell - Scheduler.

## 2017-06-13 NOTE — Telephone Encounter (Signed)
Would like him to come in for INR on Friday

## 2017-06-13 NOTE — Telephone Encounter (Signed)
This is Dalton HesselbachMaria, RN with [facility]. We've picked up [name]. 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 Sent home on IV antibiotics. There is no wound care for this amputated site. I need to get the name of the physician. All I have is Dr. Logan BoresEvans with [facility]. I can be reached at [phone]. Thank you.

## 2017-06-14 ENCOUNTER — Other Ambulatory Visit (HOSPITAL_COMMUNITY): Payer: Self-pay | Admitting: Unknown Physician Specialty

## 2017-06-15 ENCOUNTER — Other Ambulatory Visit (HOSPITAL_COMMUNITY): Payer: Self-pay | Admitting: Unknown Physician Specialty

## 2017-06-15 ENCOUNTER — Ambulatory Visit (HOSPITAL_COMMUNITY): Payer: Self-pay | Admitting: Pharmacist

## 2017-06-15 ENCOUNTER — Ambulatory Visit (HOSPITAL_COMMUNITY)
Admission: RE | Admit: 2017-06-15 | Discharge: 2017-06-15 | Disposition: A | Discharge status: Home / Self Care | Payer: BLUE CROSS/BLUE SHIELD | Source: Ambulatory Visit | Attending: Cardiology | Admitting: Cardiology

## 2017-06-15 DIAGNOSIS — Z7901 Long term (current) use of anticoagulants: Secondary | ICD-10-CM | POA: Insufficient documentation

## 2017-06-15 DIAGNOSIS — Z95811 Presence of heart assist device: Secondary | ICD-10-CM | POA: Diagnosis present

## 2017-06-15 LAB — AEROBIC/ANAEROBIC CULTURE W GRAM STAIN (SURGICAL/DEEP WOUND)

## 2017-06-15 LAB — AEROBIC/ANAEROBIC CULTURE (SURGICAL/DEEP WOUND)

## 2017-06-15 LAB — PROTIME-INR
INR: 1.35
Prothrombin Time: 16.6 seconds — ABNORMAL HIGH (ref 11.4–15.2)

## 2017-06-15 MED ORDER — ENOXAPARIN SODIUM 60 MG/0.6ML ~~LOC~~ SOLN: 60.0000 mg | Freq: Two times a day (BID) | SUBCUTANEOUS | 2 refills | Status: DC

## 2017-06-15 NOTE — Addendum Note (Signed)
Addended by: Jonne PlyURRAN, Nike [place] M on: 06/15/2017 12:24 PM   Modules accepted: Orders

## 2017-06-18 ENCOUNTER — Ambulatory Visit: Payer: BLUE CROSS/BLUE SHIELD

## 2017-06-18 ENCOUNTER — Ambulatory Visit (HOSPITAL_COMMUNITY)
Admission: RE | Admit: 2017-06-18 | Discharge: 2017-06-18 | Disposition: A | Discharge status: Home / Self Care | Payer: BLUE CROSS/BLUE SHIELD | Source: Ambulatory Visit | Attending: Cardiology | Admitting: Cardiology

## 2017-06-18 ENCOUNTER — Ambulatory Visit (INDEPENDENT_AMBULATORY_CARE_PROVIDER_SITE_OTHER): Payer: BLUE CROSS/BLUE SHIELD | Admitting: Podiatry

## 2017-06-18 ENCOUNTER — Ambulatory Visit (HOSPITAL_COMMUNITY): Payer: Self-pay | Admitting: Pharmacist

## 2017-06-18 DIAGNOSIS — Z95811 Presence of heart assist device: Secondary | ICD-10-CM | POA: Diagnosis not present

## 2017-06-18 DIAGNOSIS — Z7901 Long term (current) use of anticoagulants: Secondary | ICD-10-CM | POA: Insufficient documentation

## 2017-06-18 DIAGNOSIS — Z89421 Acquired absence of other right toe(s): Secondary | ICD-10-CM

## 2017-06-18 LAB — PROTIME-INR
INR: 1.21
Prothrombin Time: 15.2 seconds (ref 11.4–15.2)

## 2017-06-18 MED ORDER — ENOXAPARIN SODIUM 60 MG/0.6ML ~~LOC~~ SOLN: 60.0000 mg | Freq: Two times a day (BID) | SUBCUTANEOUS | 2 refills | Status: DC

## 2017-06-21 ENCOUNTER — Encounter: Payer: Self-pay | Admitting: Internal Medicine

## 2017-06-21 ENCOUNTER — Telehealth: Payer: Self-pay | Admitting: Behavioral Health

## 2017-06-21 ENCOUNTER — Ambulatory Visit (HOSPITAL_COMMUNITY): Payer: Self-pay | Admitting: Pharmacist

## 2017-06-21 ENCOUNTER — Ambulatory Visit (HOSPITAL_COMMUNITY)
Admission: RE | Admit: 2017-06-21 | Discharge: 2017-06-21 | Disposition: A | Discharge status: Home / Self Care | Payer: BLUE CROSS/BLUE SHIELD | Source: Ambulatory Visit | Attending: Cardiology | Admitting: Cardiology

## 2017-06-21 ENCOUNTER — Ambulatory Visit: Payer: Self-pay | Admitting: Internal Medicine

## 2017-06-21 ENCOUNTER — Other Ambulatory Visit (HOSPITAL_COMMUNITY): Payer: Self-pay | Admitting: Unknown Physician Specialty

## 2017-06-21 ENCOUNTER — Ambulatory Visit (INDEPENDENT_AMBULATORY_CARE_PROVIDER_SITE_OTHER): Payer: BLUE CROSS/BLUE SHIELD | Admitting: Internal Medicine

## 2017-06-21 VITALS — BP 108/71 | HR 85 | Ht 72.0 in | Wt 234.0 lb

## 2017-06-21 DIAGNOSIS — I2510 Atherosclerotic heart disease of native coronary artery without angina pectoris: Secondary | ICD-10-CM | POA: Insufficient documentation

## 2017-06-21 DIAGNOSIS — M869 Osteomyelitis, unspecified: Secondary | ICD-10-CM

## 2017-06-21 DIAGNOSIS — Z95811 Presence of heart assist device: Secondary | ICD-10-CM

## 2017-06-21 DIAGNOSIS — I110 Hypertensive heart disease with heart failure: Secondary | ICD-10-CM | POA: Insufficient documentation

## 2017-06-21 DIAGNOSIS — I252 Old myocardial infarction: Secondary | ICD-10-CM | POA: Insufficient documentation

## 2017-06-21 DIAGNOSIS — Z7901 Long term (current) use of anticoagulants: Secondary | ICD-10-CM | POA: Diagnosis not present

## 2017-06-21 DIAGNOSIS — Z7982 Long term (current) use of aspirin: Secondary | ICD-10-CM | POA: Diagnosis not present

## 2017-06-21 DIAGNOSIS — A4901 Methicillin susceptible Staphylococcus aureus infection, unspecified site: Secondary | ICD-10-CM

## 2017-06-21 DIAGNOSIS — Z452 Encounter for adjustment and management of vascular access device: Secondary | ICD-10-CM

## 2017-06-21 DIAGNOSIS — E1151 Type 2 diabetes mellitus with diabetic peripheral angiopathy without gangrene: Secondary | ICD-10-CM | POA: Insufficient documentation

## 2017-06-21 DIAGNOSIS — Z79899 Other long term (current) drug therapy: Secondary | ICD-10-CM | POA: Insufficient documentation

## 2017-06-21 DIAGNOSIS — F17210 Nicotine dependence, cigarettes, uncomplicated: Secondary | ICD-10-CM | POA: Diagnosis not present

## 2017-06-21 DIAGNOSIS — Z794 Long term (current) use of insulin: Secondary | ICD-10-CM | POA: Diagnosis not present

## 2017-06-21 DIAGNOSIS — Z955 Presence of coronary angioplasty implant and graft: Secondary | ICD-10-CM | POA: Diagnosis not present

## 2017-06-21 DIAGNOSIS — I255 Ischemic cardiomyopathy: Secondary | ICD-10-CM | POA: Diagnosis not present

## 2017-06-21 DIAGNOSIS — I5022 Chronic systolic (congestive) heart failure: Secondary | ICD-10-CM

## 2017-06-21 DIAGNOSIS — Z7902 Long term (current) use of antithrombotics/antiplatelets: Secondary | ICD-10-CM | POA: Diagnosis not present

## 2017-06-21 DIAGNOSIS — E785 Hyperlipidemia, unspecified: Secondary | ICD-10-CM | POA: Insufficient documentation

## 2017-06-21 DIAGNOSIS — D508 Other iron deficiency anemias: Secondary | ICD-10-CM

## 2017-06-21 DIAGNOSIS — I2721 Secondary pulmonary arterial hypertension: Secondary | ICD-10-CM | POA: Diagnosis not present

## 2017-06-21 DIAGNOSIS — J449 Chronic obstructive pulmonary disease, unspecified: Secondary | ICD-10-CM | POA: Diagnosis not present

## 2017-06-21 DIAGNOSIS — R7881 Bacteremia: Secondary | ICD-10-CM | POA: Diagnosis not present

## 2017-06-21 LAB — CBC
HEMATOCRIT: 29.6 % — AB (ref 39.0–52.0)
HEMOGLOBIN: 9.1 g/dL — AB (ref 13.0–17.0)
MCH: 27 pg (ref 26.0–34.0)
MCHC: 30.7 g/dL (ref 30.0–36.0)
MCV: 87.8 fL (ref 78.0–100.0)
Platelets: 177 10*3/uL (ref 150–400)
RBC: 3.37 MIL/uL — ABNORMAL LOW (ref 4.22–5.81)
RDW: 18.6 % — ABNORMAL HIGH (ref 11.5–15.5)
WBC: 6.6 10*3/uL (ref 4.0–10.5)

## 2017-06-21 LAB — BASIC METABOLIC PANEL
Anion gap: 12 (ref 5–15)
BUN: 7 mg/dL (ref 6–20)
CHLORIDE: 97 mmol/L — AB (ref 101–111)
CO2: 27 mmol/L (ref 22–32)
CREATININE: 1.12 mg/dL (ref 0.61–1.24)
Calcium: 8.7 mg/dL — ABNORMAL LOW (ref 8.9–10.3)
GFR calc non Af Amer: >60 mL/min (ref >60)
Glucose, Bld: 106 mg/dL — ABNORMAL HIGH (ref 65–99)
Potassium: 3.8 mmol/L (ref 3.5–5.1)
Sodium: 136 mmol/L (ref 135–145)

## 2017-06-21 LAB — PROTIME-INR
INR: 1.24
Prothrombin Time: 15.5 seconds — ABNORMAL HIGH (ref 11.4–15.2)

## 2017-06-21 LAB — LACTATE DEHYDROGENASE: LDH: 143 U/L (ref 98–192)

## 2017-06-21 MED ORDER — ENOXAPARIN SODIUM 60 MG/0.6ML ~~LOC~~ SOLN: 60.0000 mg | Freq: Two times a day (BID) | SUBCUTANEOUS | 2 refills | Status: DC

## 2017-06-21 NOTE — Progress Notes (Signed)
Patient presents for hospital f/u in VAD clinic. Reports no problems with VAD equipment or concerns with drive line.   Vital Signs:  Doppler Pressure: 68 Automatc BP:  108/71 (84) HR:  85 SPO2: 100  Weight:  234 lb w/ back brace Last weight: 236.8 lb w/ back brace   VAD Indication: Destination Therapy - current smoker  VAD interrogation & Equipment Management: Speed: 2700 Flow:  5.0 Power:  4.4 w    Peak/trough:  6.6/3.5 HCT: 28 Alarm settings: 2.0/6.5 Suction: On Lavare: On   Exit Site Care: Drive line is being maintained weekly  by [employer]. Unable to assess drive line at visit today; back brace in place. Pt denies any issues with drive line site reported by his father.  Pt denies fever or chills. Pt doesn't need any kits today.   Significant Events on VAD Support:  05/31/16 - Controller power-up associated with pump start event. Controller change out performed.  06/01/16 - Controller pins lubricated per St Jude reps  06/17/16 - Controller power-up associated with pump start event while changing power source 06/22/16 - Controller pins re-lubricated per [employer] reps 07/07/17 - Hospitalization for acute blood loss from upper GIB; EGD with 2 AVMs; start Octreotide 12/20/16 - stopped ASA due to continued GI bleed; GI would not consider colonoscopy unless pt was actively bleeding 04/11/17 - Hospitalization for syncope with L1 burst fracture   Device:St Jude single lead- followed at device clinic Therapies: on at 200 bpm Last check: 05/14/17   BP & Labs:  Doppler BP 68 - Doppler is reflecting modified systolic   Hgb - 9.1 no S/S of bleeding. Specifically denies melena/BRBPR or nosebleeds.  LDH 143 established baseline of 190- 250. Denies tea-colored urine. No power elevations noted on interrogation.   EKG obtained today-NSR w/ PVCs  Patient Instructions:  1. Continue Lovenox. 2. Return to clinic on Monday for INR and at your regularly scheduled appt in  July.   Carlton AdamSarah Herbert RN, BSN VAD Coordinator   Office: [phone] 24/7 Emergency VAD Pager: [phone]

## 2017-06-21 NOTE — Telephone Encounter (Signed)
[name]  Informed her per Dr. Luciana Axeomer that patient's PICC line can be discontinued and the completion of antibiotic therapy on Saturday 06/23/2017.  [name] verbalized understanding with read-back. Angeline SlimAshley Dennard Vezina RN   Patient was made aware of the above information and verbalized understanding. Angeline SlimAshley Ahley Bulls RN

## 2017-06-21 NOTE — Progress Notes (Signed)
Advanced Heart Failure Clinic Note Vital Signs:  Doppler Pressure: 68 Automatc BP:  108/71 (84) HR:  85 SPO2: 100  Weight:  234 lb w/ back brace Last weight: 236.8 lb w/ back brace   VAD Indication: Destination Therapy - current smoker  VAD interrogation & Equipment Management: Speed: 2700 Flow:  5.0 Power:  4.4 w Peak/trough:  6.6/3.5 HCT: 28 Alarm settings: 2.0/6.5 Suction: On Lavare: On   Exit Site Care: Drive line is being maintained weekly by [employer]. Unable to assess drive line at visit today; back brace in place. Pt denies any issues with drive line site reported by his father.  Pt denies fever or chills. Pt doesn't need any kits today.   Significant Events on VAD Support:  05/31/16 - Controller power-up associated with pump start event. Controller change out performed.  06/01/16 - Controller pins lubricated per St Jude reps  06/17/16 - Controller power-up associated with pump start event while changing power source 06/22/16 - Controller pins re-lubricated per [facility] 07/07/17 - Hospitalization for acute blood loss from upper GIB; EGD with 2 AVMs; start Octreotide 12/20/16 - stopped ASA due to continued GI bleed; GI would not consider colonoscopy unless pt was actively bleeding 04/11/17 - Hospitalization for syncope with L1 burst fracture   Device:St Jude single lead- followed at device clinic Therapies: on at 200 bpm Last check: 05/14/17   BP &Labs:  Doppler BP 68 - Doppler is reflecting modified systolic   Hgb - 9.1 no S/S of bleeding. Specifically denies melena/BRBPR or nosebleeds.  LDH 143 established baseline of 190- 250. Denies tea-colored urine. No power elevations noted on interrogation.   EKG obtained today-NSR w/ PVCs  Patient Instructions:  1. Continue Lovenox. 2. Return to clinic on Monday for INR and at your regularly scheduled appt in July.   Tanda Rockers RN, BSN VAD Coordinator   Office: [phone] 24/7 Emergency  VAD Pager: [phone]    PCP: Dr. Kathyrn Lass Cardiology: Dr. Stanford Breed HF Cardiology: Dr. Delilah Shan is a 62 y.o. male smoker with history of PAD, CAD, and ischemic cardiomyopathy now s/p HVAD placement presents for followup of CHF and LVAD.  Patient was admitted in 1/18 with PNA and acute on chronic systolic CHF.  He was markedly volume overloaded and also had low cardiac output requiring dobutamine use.  He had a right pleural effusion and underwent thoracentesis.  Fluid studies suggested a parapneumonic effusion.  He had cardiac cath, showing 3 vessel disease and markedly elevated filling pressures, right and left.  He was evaluated for CABG but turned down given comorbidities and lack of venous conduits.  We were able to wean him off dobutamine and discharged him with plan for outpatient PCI once he had recovered from the current hospitalization.   Admitted 01/28/16 - 02/03/16 after presenting for elective 2 vessel PCI of the LAD and OM of the circumflex. RHC at same time showed advanced HF with low cardiac index and severely elevated pressures. Diuresed with IV lasix, metolazone, and milrinone, but refused PICC line to follow coox/CVP. Underwent thoracentesis 02/01/16 with 1.8 L out. Overall diuresed 16 lbs. Discharge weight 214 lbs.  He was admitted in 4/18 with low output HF and severe NYHA class IV symptoms, started on milrinone.  Unable to titrate off milrinone and is now on at home.  He had a right chronic transudative effusion, PleurX catheter was placed.    He had HVAD placed 05/30/16.  He was diuresed post-op and speed  was increased to 3000.  It was later dropped to 2940.  Pleurx catheter was removed.    He was admitted in 7/18 with upper GI bleed.  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  He had 3 units PRBCs.  ASA was decreased from 325 to 81 daily and warfarin goal was decreased to 2-2.5.  Octreotide was started but he developed profuse diarrhea that seems  to have been related to octreotide.  This was stopped and he was started on danazol.    He has calf claudication on left after walking a moderate distance.  This is chronic and followed by [facility]. His left fem-pop graft has occluded, plan for medical management as symptoms are mild.  No pedal ulcerations.    At last appointment, he reported dark stool and hgb was lower.  He had been having significant epistaxis and INR was > 5.  ASA was stopped.  Since then, he has had no further epistaxis or dark stool.  Hgb has been stable, most recently 10.3.   Admitted 4/10 through 04/16/2017. Admitted for syncope thought to be the result of a vagal episode. Due to cough he had L1 Fracture . Neuro surgery consulted with back brace applied. He will follow up with Dr Arnoldo Morale in 6 weeks. Due to recurrent N/V GI consulted and he was placed on carafate and reglan.   Admitted 6/5 through 06/12/2017 with MSSA sepsis requiring toe amputation. He had osteo underwent r great toe amputation. PICC line place for home antibiotics. He will continue IV ancef for 2 weeks. He was discharged once INR was therapeutic.   Today he presents for post hospital follow up. Overall feeling ok.  Denies SOB/PND/Orthopnea. No bleeding problems. Appetite fair. No fever or chills. Weight at home has been stable. Taking all medications.     Vital Signs:  Doppler Pressure:68 Automatc BP: 108/71 HR:85 SPO2: 100  Weight: 324 lbs Last weight: 236 pounds.  Home weights: 230-235 lbs  VAD Indication: Destination Therapy - current smoker  VAD interrogation & Equipment Management: Speed: 2700 Flow: 5 Power: 4.4  Peak/trough: 6.6/3.5 HCT: 28  Alarm settings:  Suction: On Lavare: On   Labs (1/18): K 4.2, creatinine 0.9, hgb 12.6 Labs (2/18): K 4.2, creatinine 1.23, hgb 12.4, BNP 321 Labs (4/18): hgbA1c 8.9, TSH elevated but free T4 normal.  K 3.2, creatinine 0.8 Labs (5/18): digoxin 0.4, K 3.4, creatinine 0.9 Labs (6/18): creatinine  0.67 => 0.98, LDH 209, hgb 8.9 => 9 Labs (7/18): K 2.9, creatinine 1.02, LDL 189, hgb 9 Labs (8/18): hgb 10 => 10.2, LDH 204, K 3.2, creatinine 0.9 => 1.04, INR 2.45, LDH 204 Labs (9/18): LDH 206, INR 3.26, K 3.5, creatinine 0.91, hgb 11.3 Labs (10/18): LDH 197, hgb 11.4, K 3.1, creatinine 1.12 Labs (11/18): Hgb 10.3, K 4, creatinine 1.0, LDH 196 Labs (12/18): hgb 11 => 7.9 => 8.6, INR 5 Labs (1/19): hgb 10.3, ferritin 62 Labs 04/23/2017: hgb 13.4 WBC 7.2  K 3.3    PMH: 1. PAD: Bilateral fem-pop bypasses - Arterial dopplers (9/18) with occluded left fem-pop, right fem-pop patent.  2. CAD: Anterior MI 2005 => PCI to LAD followed by staged PCI to LCx and RCA.   - LHC (1/18): Diffuse up to 80% calcified stenosis throughout the proximal LAD.  Diffuse disease in distal LAD up to 50%.  Large OM1 totally occluded at the ostium but reconstituted via collaterals.  80% distal RCA stenosis involving the ostia of the PLV and PDA with  about 70% stenosis. Patient had PCI with DES to proximal LAD and PTCA OM1 (unable to place stent).  3. Chronic systolic CHF: Ischemic cardiomyopathy.  St Jude ICD.  - Echo (1/18) with EF 20%, mildly decreased RV systolic function.  - RHC (1/18) with mean RA 21, PA 69/31 mean 45, mean PCWP 27, CI 2.37, PVR 3.3 WU.  - Repeat RHC (1/18) with mean RA 16, mean PCWP 21, CI 1.92 - Echo (3/18) with EF 20-25%, mild MR, mild RV dilation with normal systolic function, PASP 47 mmHg.  - 4/18 admission with low output HF and milrinone gtt started (co-ox as low as 42%).  - RHC on milrinone (4/18): mean RA 5, PA 61/24 mean 40, mean PCWP 17, CI 2.1 Fick with PVR 5.1 WU, CI 1.84 thermo with PVR 5.9 WU.  - HVAD placement 05/30/16.  4. Carotid stenosis: Carotid dopplers (8/16) with 58-52% RICA, 77-82% LICA. - Carotid dopplers (2/18) with 40-59% RICA stenosis.  5. Type II diabetes. 6. Hyperlipidemia. 7. HTN 8. COPD:  PFTs (5/18) with moderate to severe obstructive disease.  Active smoker. 9.  Chronic right pleural effusion (transudative) with PleurX catheter.  10. Upper GI bleeding: 7/18, enteroscopy with 2 small bowel AVMs (treated).   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  Smokes < 5 cigs/day.  Occasional ETOH.   Review of systems complete and found to be negative unless listed in HPI.    Family History  Problem Relation Age of Onset  . Diabetes Mother   . Coronary artery disease Father   . Heart disease Father        before age 36   Current Outpatient Medications  Medication Sig Dispense Refill  . atorvastatin (LIPITOR) 80 MG tablet Take 1 tablet (80 mg total) by mouth daily. 30 tablet 6  . blood glucose meter kit and supplies Ultra Blue Kit Dispense based on patient and insurance preference. Use up to four times daily as directed. (FOR ICD-9 250.00, 250.01). 1 each 0  . calcium carbonate (TUMS EX) 750 MG chewable tablet Chew 2 tablets by mouth as needed for heartburn.    Marland Kitchen ceFAZolin (ANCEF) IVPB Inject 2 g into the vein every 8 (eight) hours for 11 days. Indication:  Osteomyelitis Last Day of Therapy:  06/23/17 Labs - Once weekly:  CBC/D and BMP, Labs - Every other week:  ESR and CRP 36 Units 0  . danazol (DANOCRINE) 100 MG capsule Take 1 capsule (100 mg total) by mouth 2 (two) times daily. 180 capsule 3  . enoxaparin (LOVENOX) 60 MG/0.6ML injection Inject 0.6 mLs (60 mg total) into the skin every 12 (twelve) hours. 10 Syringe 2  . ezetimibe (ZETIA) 10 MG tablet Take 1 tablet (10 mg total) by mouth daily. 90 tablet 3  . Ferrous Sulfate (IRON) 325 (65 Fe) MG TABS Take 1 tablet (325 mg total) by mouth 2 (two) times daily with a meal. 60 tablet 6  . insulin NPH-regular Human (NOVOLIN 70/30) (70-30) 100 UNIT/ML injection Inject 12 Units into the skin 2 (two) times daily.     Marland Kitchen losartan (COZAAR) 50 MG tablet Take 1 tablet (50 mg total) by mouth 2 (two) times daily. 60 tablet 6  . metoCLOPramide (REGLAN) 5 MG tablet Take 1 tablet (5 mg total) by mouth 3  (three) times daily before meals. (Patient taking differently: Take 5 mg by mouth daily. ) 90 tablet 1  . pantoprazole (PROTONIX) 40 MG tablet Take 1 tablet (40 mg  total) by mouth 2 (two) times daily. 180 tablet 6  . potassium chloride SA (K-DUR,KLOR-CON) 20 MEQ tablet Take 1 tablet (20 mEq total) by mouth daily. 90 tablet 6  . sertraline (ZOLOFT) 25 MG tablet Take 1 tablet (25 mg total) by mouth daily. 30 tablet 6  . sildenafil (REVATIO) 20 MG tablet Take 1 tablet (20 mg total) by mouth 3 (three) times daily. (Patient taking differently: Take 20 mg by mouth 2 (two) times daily. ) 90 tablet 11  . spironolactone (ALDACTONE) 25 MG tablet Take 0.5 tablets (12.5 mg total) by mouth daily. 30 tablet 6  . sucralfate (CARAFATE) 1 g tablet Take 1 tablet (1 g total) by mouth 4 (four) times daily -  with meals and at bedtime. (Patient taking differently: Take 1 g by mouth 2 (two) times daily. ) 120 tablet 6  . torsemide (DEMADEX) 20 MG tablet Take 2 tablets (40 mg total) by mouth daily. 120 tablet 6  . traMADol (ULTRAM) 50 MG tablet Take 1 tablet (50 mg total) by mouth every 6 (six) hours as needed for moderate pain. 30 tablet 0  . warfarin (COUMADIN) 5 MG tablet 5 mg. Take 1 tablet (5 mg) daily    . oxyCODONE-acetaminophen (PERCOCET/ROXICET) 5-325 MG tablet Take 1-2 tablets by mouth every 8 (eight) hours as needed for moderate pain. (Patient not taking: Reported on 05/17/2017) 30 tablet 0   No current facility-administered medications for this encounter.    Vitals:   06/21/17 0912  BP: 108/71  Pulse: 85  SpO2: 100%  Weight: 234 lb (106.1 kg)  Height: 6' (1.829 m)   Wt Readings from Last 3 Encounters:  06/21/17 234 lb (106.1 kg)  06/12/17 234 lb 4.8 oz (106.3 kg)  05/17/17 236 lb 12.8 oz (107.4 kg)     Physical Exam: GENERAL: Appears chronically ill. He presents to clinic today in no acute distress. HEENT: normal  NECK: Supple, JVP 6-7 .  2+ bilaterally, no bruits.  No lymphadenopathy or  thyromegaly appreciated.   CARDIAC:  Mechanical heart sounds with LVAD hum present.  LUNGS:  Clear to auscultation bilaterally.  ABDOMEN:  Soft, round, nontender, positive bowel sounds x4.     LVAD exit site: well-healed and incorporated.  Dressing dry and intact.  No erythema or drainage.  Stabilization device present and accurately applied.  Driveline dressing is being changed daily per sterile technique. EXTREMITIES:  Warm and dry, no cyanosis, clubbing, rash or edema. R foot orthotic shoe . RUE PICC  NEUROLOGIC:  Alert and oriented x 4.  Gait steady.  No aphasia.  No dysarthria.  Affect pleasant.          Wt Readings from Last 3 Encounters:  06/21/17 234 lb (106.1 kg)  06/12/17 234 lb 4.8 oz (106.3 kg)  05/17/17 236 lb 12.8 oz (107.4 kg)   Assessment/Plan: 1. Chronic systolic CHF: Ischemic cardiomyopathy.  Echo in 3/18 with EF 20-25%.  He did not have much effect from 12/17 PCI in terms of symptoms or EF.  St Jude ICD.  He is now s/p HVAD placement.  Lavare cycle is on, parameters stable on device with no alarms.   - NYHA II. Volume status stable. .  - Continue torsemide 40 mg daily  - Cotninue spiro 12.5 mg daily.   - VAD interrogated personally. Parameters stable.  - Continue losartan 50 mg twice a day.  -Check LDH/INR today . 2. CAD: 3 vessel CAD, s/p stent to proximal LAD and PTCA to subtotally occluded  large OM1 in 1/18.  - No s/s ischemia  - He was on Plavix for > 3 months post-intervention, now on warfarin.  ASA stopped with bleeding.  - Continue atorvastatin 80 mg daily.   3. PAD: Stable claudication, notes with moderate walking on left.  Left fem-pop bypass occluded.  No pedal ulcerations. - Medical management per Dr. Scot Dock.  4. Carotid stenosis:  - Followed by [facility].  5. R Pleural effusion:  - Previously had PleurX catheter. Now resolved.   6. Pulmonary arterial HTN: Continue Revatio 20 mg tid. Reinfoeced taking this 3 times a day.  7. Smoking: Discussed smoking  cessation.    8. Upper GI bleeding: 7/18, small bowel AVMs.  ASA was decreased to 81 daily.  Warfarin INR goal is now 2-2.5.  He started monthly octreotide injections but had profuse diarrhea with octreotide after 1st injection and after 2nd injection with decreased dose.  He is tolerating danazol without problems. 12/18 had more melena in setting of high INR => due to vigorous epistaxis versus AVM bleeding. ASA was stopped.    - He stay off ASA 81 mg daily. Continue PPI twice a day . Marland Kitchen  - Continue danazol.  9. H/O Epistaxis:  - No further issues with use of regular saline nasal spray.  10. L1 Burst Fracture -Follows with Dr Arnoldo Morale.  11. Nausea/Dry heaves - Has GI follow up. Continue protonix twice a day. Continue carafate.   Continue reglan.  12. R great toe osteo. S/P R great toe amputation.  He has follow up with Dr Amalia Hailey. No s/s infeciton 13. MSSA from R great toe, Completing 14 days of IV antibiotics. He has follow up with ID.  Follow up in 4 weekS.   Labs today. INR 1.24 Continue lovenox injection and coumadin. Discussed with pharmacy. Renal function and LDH stable.   Darrick Grinder, NP  06/21/2017

## 2017-06-22 ENCOUNTER — Other Ambulatory Visit: Payer: Self-pay | Admitting: Pharmacist

## 2017-06-22 ENCOUNTER — Other Ambulatory Visit (HOSPITAL_COMMUNITY): Payer: Self-pay | Admitting: Unknown Physician Specialty

## 2017-06-22 ENCOUNTER — Telehealth: Payer: Self-pay | Admitting: *Deleted

## 2017-06-22 ENCOUNTER — Other Ambulatory Visit: Payer: Self-pay

## 2017-06-22 ENCOUNTER — Encounter (HOSPITAL_COMMUNITY): Payer: Self-pay

## 2017-06-22 ENCOUNTER — Emergency Department (HOSPITAL_COMMUNITY)
Admission: EM | Admit: 2017-06-22 | Discharge: 2017-06-22 | Disposition: A | Discharge status: Home / Self Care | Payer: BLUE CROSS/BLUE SHIELD | Attending: Emergency Medicine | Admitting: Emergency Medicine

## 2017-06-22 DIAGNOSIS — Z95811 Presence of heart assist device: Secondary | ICD-10-CM

## 2017-06-22 DIAGNOSIS — I5022 Chronic systolic (congestive) heart failure: Secondary | ICD-10-CM | POA: Diagnosis not present

## 2017-06-22 DIAGNOSIS — F17210 Nicotine dependence, cigarettes, uncomplicated: Secondary | ICD-10-CM | POA: Diagnosis not present

## 2017-06-22 DIAGNOSIS — I110 Hypertensive heart disease with heart failure: Secondary | ICD-10-CM | POA: Diagnosis not present

## 2017-06-22 DIAGNOSIS — E119 Type 2 diabetes mellitus without complications: Secondary | ICD-10-CM | POA: Diagnosis not present

## 2017-06-22 DIAGNOSIS — Z452 Encounter for adjustment and management of vascular access device: Secondary | ICD-10-CM | POA: Insufficient documentation

## 2017-06-22 DIAGNOSIS — R040 Epistaxis: Secondary | ICD-10-CM | POA: Insufficient documentation

## 2017-06-22 DIAGNOSIS — Z79899 Other long term (current) drug therapy: Secondary | ICD-10-CM | POA: Diagnosis not present

## 2017-06-22 DIAGNOSIS — Z794 Long term (current) use of insulin: Secondary | ICD-10-CM | POA: Insufficient documentation

## 2017-06-22 DIAGNOSIS — Z7901 Long term (current) use of anticoagulants: Secondary | ICD-10-CM

## 2017-06-22 MED ORDER — CEPHALEXIN 500 MG PO CAPS: 500.0000 mg | ORAL_CAPSULE | Freq: Four times a day (QID) | ORAL | 0 refills | Status: DC

## 2017-06-22 MED ORDER — OXYMETAZOLINE HCL 0.05 % NA SOLN
1.0000 | Freq: Once | NASAL | Status: AC
  Administered 2017-06-22: 1 via NASAL
  Filled 2017-06-22: qty 15

## 2017-06-22 MED ORDER — SILVER NITRATE-POT NITRATE 75-25 % EX MISC
1.0000 | Freq: Once | CUTANEOUS | Status: AC
  Administered 2017-06-22: 1 via TOPICAL
  Filled 2017-06-22: qty 1

## 2017-06-22 NOTE — Progress Notes (Signed)
[facility] pharmacy lab review  

## 2017-06-22 NOTE — Telephone Encounter (Signed)
-----   Message from Gardiner Barefootobert W Comer, MD sent at 06/22/2017  9:24 AM EDT ----- Can you let him know I decided I am going to have him continue with oral Keflex 500 mg four times a day for 2 weeks after he completes the cefazolin on Saturday.  He can start later that day.  We will then get the blood cultures later.  I will schedule that with his LVAD coordinator.  It has been sent.  He doesn't need to come back to see me though, if he asks.  thanks

## 2017-06-22 NOTE — ED Provider Notes (Signed)
[facility] EMERGENCY DEPARTMENT Provider Note   CSN: [id number] Arrival date & time: 06/22/17  1459     History   Chief Complaint Chief Complaint  Patient presents with  . Epistaxis    HPI [name] is a 62 y.o. male.  Chief complaint is nosebleed  HPI 62 year old male.  LVAD patient.  Recent admission for pneumonia and septic shock.  Finishing outpatient PICC line antibiotics.  Is on Lovenox as well as Coumadin.  His INR was 1.2 yesterday.  His Coumadin was increased.  He has had intermittent bleeding from his right nose.  Bled steadily from yesterday until today.  He presents for evaluation.  Past Medical History:  Diagnosis Date  . AICD (automatic cardioverter/defibrillator) present   . Arthritis   . Barretts syndrome   . Carotid artery disease ([place])    a. Dopp 09/2011: 60-73% RICA, 71-06% LICA.;  b.  Carotid US (7/14):  Bilateral 60-79% => f/u 6 mos  . Cerebrovascular disease   . Chronic systolic heart failure ([place])   . Coronary artery disease    a. AWMI requiring IABP 2005 s/p Horizon study stent-LAD, staged BMS-Cx, DESx2-RCA. b. Last Last LHC (6/06):  EF 25%, pLAD stent 40-50, after stent 40, dLAD 20, pCFX 20, pOM1 70, pRCA 20, mRCA stents ok.=> med Rx;  c.  Myoview (7/14):  EF 26%, large ant, septal, inf and apical infarct, no ischemia.  Med Rx continued  . Diabetes mellitus   . Dyspnea    with exertion  . Headache   . Heart murmur   . History of kidney stones   . HTN (hypertension)   . Hyperlipidemia    mixed  . ICD (implantable cardiac defibrillator), dual, in situ    St. Jude for severe LVD EF 25% 2/07 explanted 2010. Medtronic Virtuoso II DR Dual-chamber cardiverter-defibrillator  with pocket revision, Dr. Caryl Comes  . Ischemic cardiomyopathy    a. Echo (09/27/12): EF 20%, diffuse HK with periapical AK, no LV thrombus noted, restrictive physiology, trivial MR, mild LAE, RVSF mildly reduced, PASP 39  . Pleural effusion 01/05/2016  . Presence of  permanent cardiac pacemaker   . PVD (peripheral vascular disease) (HCC)    a. ABI 09/2011 - R normal, L moderate - saw Dr. Fletcher Anon - med rx.   Marland Kitchen RIATA ICD Lead--on advisory recall    . Tobacco abuse     Patient Active Problem List   Diagnosis Date Noted  . PICC (peripherally inserted central catheter) in place 06/22/2017  . MSSA bacteremia 06/11/2017  . Osteomyelitis of toe ([place]) 06/11/2017  . Septic shock ([place]) 06/06/2017  . Syncope and collapse 04/11/2017  . Fall 04/11/2017  . Low back pain 04/11/2017  . PVC's (premature ventricular contractions)   . Acute upper GI bleed 07/15/2016  . Symptomatic anemia 07/07/2016  . LVAD (left ventricular assist device) present ([place]) 06/16/2016  . Acute on chronic systolic (congestive) heart failure ([place]) 05/24/2016  . Low output heart failure ([place]) 04/05/2016  . Ischemic cardiomyopathy   . Pleural effusion on right   . Diabetes mellitus with complication ([place])   . Hypoglycemia 03/13/2015  . Chronic systolic CHF (congestive heart failure) ([place]) 03/13/2015  . Pressure ulcer 12/31/2014  . Pleural effusion 12/30/2014  . Hyperglycemia without ketosis   . Infected open wound 09/22/2014  . Type 2 diabetes mellitus ([place]) 09/22/2014  . Skin ulcer ([place]) 09/21/2014  . PAD (peripheral artery disease) ([place]) 11/07/2013  . PVD (peripheral vascular disease) ([place])   .  Pre-operative cardiovascular examination   . Metabolic alkalosis 19/14/7829  . Hypotension 10/01/2012  . Carotid stenosis 10/25/2011  . Hypertension 08/09/2010  . Automatic implantable cardioverter-defibrillator in St. Jude 08/09/2010  . Coronary atherosclerosis 01/21/2009  . TOBACCO ABUSE 05/15/2008  . Cerebrovascular disease 05/15/2008  . Peripheral vascular disease ([place]) 05/15/2008  . Hyperlipidemia 01/22/2008  . SYSTOLIC HEART FAILURE, CHRONIC 01/22/2008    Past Surgical History:  Procedure Laterality Date  . ABDOMINAL AORTAGRAM N/A 11/06/2013   Procedure: ABDOMINAL Maxcine Ham;   Surgeon: Angelia Mould, MD;  Location: [facility] CATH LAB;  Service: Cardiovascular;  Laterality: N/A;  . AMPUTATION TOE Right 06/08/2017   Procedure: AMPUTATION TOE;  Surgeon: Edrick Kins, DPM;  Location: [facility];  Service: Podiatry;  Laterality: Right;  . APPENDECTOMY  2000   ruptured  . CARDIAC CATHETERIZATION N/A 01/07/2016   Procedure: Right/Left Heart Cath and Coronary Angiography;  Surgeon: Larey Dresser, MD;  Location: [facility] CV LAB;  Service: Cardiovascular;  Laterality: N/A;  . CARDIAC CATHETERIZATION N/A 01/28/2016   Procedure: Coronary Stent Intervention;  Surgeon: Sherren Mocha, MD;  Location: [facility] CV LAB;  Service: Cardiovascular;  Laterality: N/A;  . CARDIAC CATHETERIZATION N/A 01/28/2016   Procedure: Right Heart Cath;  Surgeon: Sherren Mocha, MD;  Location: [facility] CV LAB;  Service: Cardiovascular;  Laterality: N/A;  . CARDIAC CATHETERIZATION N/A 01/28/2016   Procedure: Intravascular Ultrasound/IVUS;  Surgeon: Sherren Mocha, MD;  Location: [facility] CV LAB;  Service: Cardiovascular;  Laterality: N/A;  . CHEST TUBE INSERTION Right 04/07/2016   Procedure: INSERTION PLEURAL DRAINAGE CATHETER;  Surgeon: Ivin Poot, MD;  Location: [facility];  Service: Thoracic;  Laterality: Right;  . ENTEROSCOPY N/A 07/10/2016   Procedure: ENTEROSCOPY;  Surgeon: Laurence Spates, MD;  Location: [facility] ENDOSCOPY;  Service: Endoscopy;  Laterality: N/A;  . FEMORAL-POPLITEAL BYPASS GRAFT Right 11/07/2013   Procedure: Right FEMORAL-POPLITEAL ARTERY Bypass ;  Surgeon: Angelia Mould, MD;  Location: [facility];  Service: Vascular;  Laterality: Right;  . FEMORAL-POPLITEAL BYPASS GRAFT Left 11/17/2014   Procedure: BYPASS GRAFT FEMORAL-POPLITEAL ARTERY USING GORE PROPATEN 6MM X 80CM VASCULAR GRAFT;  Surgeon: Angelia Mould, MD;  Location: [facility];  Service: Vascular;  Laterality: Left;  . HOT HEMOSTASIS N/A 07/10/2016   Procedure: HOT HEMOSTASIS (ARGON PLASMA COAGULATION/BICAP);  Surgeon: Laurence Spates, MD;  Location: [facility] ENDOSCOPY;  Service: Endoscopy;  Laterality: N/A;  . IMPLANTABLE CARDIOVERTER DEFIBRILLATOR (ICD) GENERATOR CHANGE N/A 12/16/2012   Procedure: ICD GENERATOR CHANGE;  Surgeon: Deboraha Sprang, MD;  Location: [facility] CATH LAB;  Service: Cardiovascular;  Laterality: N/A;  . INSERTION OF IMPLANTABLE LEFT VENTRICULAR ASSIST DEVICE N/A 05/30/2016   Procedure: INSERTION OF IMPLANTABLE LEFT VENTRICULAR ASSIST DEVICE;  Surgeon: Ivin Poot, MD;  Location: [facility];  Service: Open Heart Surgery;  Laterality: N/A;  CIRC ARREST  NITRIC OXIDE  . INTRAOPERATIVE ARTERIOGRAM Right 11/07/2013   Procedure: INTRA OPERATIVE ARTERIOGRAM;  Surgeon: Angelia Mould, MD;  Location: [facility];  Service: Vascular;  Laterality: Right;  . LOWER EXTREMITY ANGIOGRAM Bilateral 11/06/2013   Procedure: LOWER EXTREMITY ANGIOGRAM;  Surgeon: Angelia Mould, MD;  Location: [facility] CATH LAB;  Service: Cardiovascular;  Laterality: Bilateral;  . Medtronic Virtuoso II DR dual-chamber cardioverter-defibrillation with pocket revison     Dr. Virl Axe  . MULTIPLE EXTRACTIONS WITH ALVEOLOPLASTY N/A 04/26/2016   Procedure: Extraction of tooth #'s 5-14 and 20-30 with alveoloplasty;  Surgeon: Lenn Cal, DDS;  Location: [facility];  Service: Oral Surgery;  Laterality:  N/A;  . PERIPHERAL VASCULAR CATHETERIZATION N/A 09/25/2014   Procedure: Abdominal Aortogram;  Surgeon: Elam Dutch, MD;  Location: [facility] CV LAB;  Service: Cardiovascular;  Laterality: N/A;  . REMOVAL OF PLEURAL DRAINAGE CATHETER Right 06/13/2016   Procedure: REMOVAL OF PLEURAL DRAINAGE CATHETER;  Surgeon: Ivin Poot, MD;  Location: [facility];  Service: Thoracic;  Laterality: Right;  . RIGHT HEART CATH N/A 05/01/2016   Procedure: Right Heart Cath;  Surgeon: Larey Dresser, MD;  Location: [facility] CV LAB;  Service: Cardiovascular;  Laterality: N/A;  . RIGHT HEART CATH N/A 05/25/2016   Procedure: Right Heart Cath;  Surgeon: Larey Dresser,  MD;  Location: [facility] CV LAB;  Service: Cardiovascular;  Laterality: N/A;  . TEE WITHOUT CARDIOVERSION N/A 05/30/2016   Procedure: TRANSESOPHAGEAL ECHOCARDIOGRAM (TEE);  Surgeon: Prescott Gum, Collier Salina, MD;  Location: [facility];  Service: Open Heart Surgery;  Laterality: N/A;  . TEE WITHOUT CARDIOVERSION N/A 06/11/2017   Procedure: TRANSESOPHAGEAL ECHOCARDIOGRAM (TEE);  Surgeon: Larey Dresser, MD;  Location: [facility] ENDOSCOPY;  Service: Cardiovascular;  Laterality: N/A;  . US GUIDED THORACENTESIS RIGHT ([place] HX) Right 01/05/2016        Home Medications    Prior to Admission medications   Medication Sig Start Date End Date Taking? Authorizing Provider  atorvastatin (LIPITOR) 80 MG tablet Take 1 tablet (80 mg total) by mouth daily. 04/17/17   Clegg, Amy D, NP  blood glucose meter kit and supplies Ultra Blue Kit Dispense based on patient and insurance preference. Use up to four times daily as directed. (FOR ICD-9 250.00, 250.01). 08/04/16   Larey Dresser, MD  calcium carbonate (TUMS EX) 750 MG chewable tablet Chew 2 tablets by mouth as needed for heartburn.    [provider]  cephALEXin (KEFLEX) 500 MG capsule Take 1 capsule (500 mg total) by mouth 4 (four) times daily. 06/22/17   Thayer Headings, MD  danazol (DANOCRINE) 100 MG capsule Take 1 capsule (100 mg total) by mouth 2 (two) times daily. 05/18/17   Shirley Friar, PA-C  enoxaparin (LOVENOX) 60 MG/0.6ML injection Inject 0.6 mLs (60 mg total) into the skin every 12 (twelve) hours. 06/21/17   Bensimhon, Shaune Pascal, MD  ezetimibe (ZETIA) 10 MG tablet Take 1 tablet (10 mg total) by mouth daily. 05/04/17   Larey Dresser, MD  Ferrous Sulfate (IRON) 325 (65 Fe) MG TABS Take 1 tablet (325 mg total) by mouth 2 (two) times daily with a meal. 06/12/17   Clegg, Amy D, NP  insulin NPH-regular Human (NOVOLIN 70/30) (70-30) 100 UNIT/ML injection Inject 12 Units into the skin 2 (two) times daily.     [provider]  losartan (COZAAR) 50 MG  tablet Take 1 tablet (50 mg total) by mouth 2 (two) times daily. 06/12/17   Clegg, Amy D, NP  metoCLOPramide (REGLAN) 5 MG tablet Take 1 tablet (5 mg total) by mouth 3 (three) times daily before meals. Patient taking differently: Take 5 mg by mouth daily.  04/16/17 04/16/18  Clegg, Amy D, NP  oxyCODONE-acetaminophen (PERCOCET/ROXICET) 5-325 MG tablet Take 1-2 tablets by mouth every 8 (eight) hours as needed for moderate pain. Patient not taking: Reported on 06/21/2017 05/04/17   Shirley Friar, PA-C  pantoprazole (PROTONIX) 40 MG tablet Take 1 tablet (40 mg total) by mouth 2 (two) times daily. 06/13/17   Larey Dresser, MD  potassium chloride SA (K-DUR,KLOR-CON) 20 MEQ tablet Take 1 tablet (20 mEq total) by mouth daily.  06/12/17   Clegg, Amy D, NP  sertraline (ZOLOFT) 25 MG tablet Take 1 tablet (25 mg total) by mouth daily. 06/12/17   Clegg, Amy D, NP  sildenafil (REVATIO) 20 MG tablet Take 1 tablet (20 mg total) by mouth 3 (three) times daily. Patient taking differently: Take 20 mg by mouth 2 (two) times daily.  05/15/17   Larey Dresser, MD  spironolactone (ALDACTONE) 25 MG tablet Take 0.5 tablets (12.5 mg total) by mouth daily. 06/13/17   Clegg, Amy D, NP  sucralfate (CARAFATE) 1 g tablet Take 1 tablet (1 g total) by mouth 4 (four) times daily -  with meals and at bedtime. Patient taking differently: Take 1 g by mouth 2 (two) times daily.  04/16/17   Clegg, Amy D, NP  torsemide (DEMADEX) 20 MG tablet Take 2 tablets (40 mg total) by mouth daily. 06/13/17   Larey Dresser, MD  traMADol (ULTRAM) 50 MG tablet Take 1 tablet (50 mg total) by mouth every 6 (six) hours as needed for moderate pain. 06/12/17   Clegg, Amy D, NP  warfarin (COUMADIN) 5 MG tablet 5 mg. Take 1 tablet (5 mg) daily    [provider]    Family History Family History  Problem Relation Age of Onset  . Diabetes Mother   . Coronary artery disease Father   . Heart disease Father        before age 65    Social  History Social History   Tobacco Use  . Smoking status: Current Every Day Smoker    Packs/day: 0.50    Years: 45.00    Pack years: 22.50    Types: Cigarettes  . Smokeless tobacco: Never Used  . Tobacco comment: pt reports he is stress smoker  Substance Use Topics  . Alcohol use: No    Alcohol/week: 0.0 oz  . Drug use: No    Comment: former Cocain, Acid, Marijuana - 25 years ago     Allergies   Metformin and related and Niacin and related   Review of Systems Review of Systems  Constitutional: Negative for appetite change, chills, diaphoresis, fatigue and fever.  HENT: Positive for nosebleeds. Negative for mouth sores, sore throat and trouble swallowing.   Eyes: Negative for visual disturbance.  Respiratory: Negative for cough, chest tightness, shortness of breath and wheezing.   Cardiovascular: Negative for chest pain.  Gastrointestinal: Negative for abdominal distention, abdominal pain, diarrhea, nausea and vomiting.  Endocrine: Negative for polydipsia, polyphagia and polyuria.  Genitourinary: Negative for dysuria, frequency and hematuria.  Musculoskeletal: Negative for gait problem.  Skin: Negative for color change, pallor and rash.  Neurological: Negative for dizziness, syncope, light-headedness and headaches.  Hematological: Does not bruise/bleed easily.  Psychiatric/Behavioral: Negative for behavioral problems and confusion.     Physical Exam Updated Vital Signs Pulse 84   Resp 16   Ht 6' 1"  (1.854 m)   Wt 105.7 kg (233 lb)   SpO2 99%   BMI 30.74 kg/m   Physical Exam  Constitutional: He is oriented to person, place, and time. He appears well-developed and well-nourished. No distress.  Awake and alert.  No distress.  No active bleeding.  Has gauze in his right naris  HENT:  Head: Normocephalic.  Gauze removed.  Area of excoriation and probable bleeding source left anterior septum.  No pulsatile bleeding  Eyes: Pupils are equal, round, and reactive to light.  Conjunctivae are normal. No scleral icterus.  Neck: Normal range of motion. Neck supple. No  thyromegaly present.  Cardiovascular: Normal rate and regular rhythm. Exam reveals no gallop and no friction rub.  No murmur heard. LVAD.  Audible hum.  Pulmonary/Chest: Effort normal and breath sounds normal. No respiratory distress. He has no wheezes. He has no rales.  Abdominal: Soft. Bowel sounds are normal. He exhibits no distension. There is no tenderness. There is no rebound.  Musculoskeletal: Normal range of motion.  Neurological: He is alert and oriented to person, place, and time.  Skin: Skin is warm and dry. No rash noted.  Psychiatric: He has a normal mood and affect. His behavior is normal.     ED Treatments / Results  Labs (all labs ordered are listed, but only abnormal results are displayed) Labs Reviewed - No data to display  EKG None  Radiology No results found.  Procedures .Epistaxis Management Date/Time: 06/22/2017 5:02 PM Performed by: Tanna Furry, MD Authorized by: Tanna Furry, MD   Consent:    Consent obtained:  Verbal   Consent given by:  Patient   Risks discussed:  Bleeding Anesthesia (see MAR for exact dosages):    Anesthesia method:  None Procedure details:    Treatment site:  R anterior   Treatment method:  Anterior pack   Treatment complexity:  Limited Post-procedure details:    Assessment:  Bleeding stopped   Patient tolerance of procedure:  Tolerated well, no immediate complications Comments:     Cotton with Afrin placed in the nares for 20 minutes.  After removal.  There was still minimal slight continued bleeding.  This resolved with silver nitrate cautery.  Anterior Merisel sponge placed.  Tolerated well.  Reexamined at 30 minutes.  No blood staining of the packing, posterior pharynx, left naris.   (including critical care time)  Medications Ordered in ED Medications  silver nitrate applicators applicator 1 Stick (has no administration in time  range)  oxymetazoline (AFRIN) 0.05 % nasal spray 1 spray (1 spray Each Nare Given by Other 06/22/17 1547)     Initial Impression / Assessment and Plan / ED Course  I have reviewed the triage vital signs and the nursing notes.  Pertinent labs & imaging results that were available during my care of the patient were reviewed by me and considered in my medical decision making (see chart for details).   Marland Kitchenepist  Final Clinical Impressions(s) / ED Diagnoses   Final diagnoses:  Right-sided epistaxis    ED Discharge Orders    None       Tanna Furry, MD 06/22/17 [phone]

## 2017-06-22 NOTE — Assessment & Plan Note (Signed)
S/p source control.  I will have him continue with Keflex 500 mg qid for 2 weeks through July 5th.

## 2017-06-22 NOTE — ED Notes (Signed)
Pt bleeding from R nostril since 1800 last night; bleeding controlled at this time

## 2017-06-22 NOTE — Progress Notes (Signed)
   Subjective:    Patient ID: Dalton RainwaterLarry D [name], male    DOB: July 26, 1955, 62 y.o.   MRN: [id number]  HPI Here for hsfu. He has an LVAD and developed toe osteomyelitis leading to MSSA bacteremia with TE negative for vegetation. He had a picc line placed and plan to do IV therapy through June 21 and then Keflex for 2 additional weeks.  No associated rash or diarrhea.  No new issues.  Feels well.      Review of Systems  Constitutional: Negative for fatigue.  Gastrointestinal: Negative for diarrhea.       Objective:   Physical Exam  Constitutional: He appears well-developed and well-nourished.  Eyes: No scleral icterus.  Pulmonary/Chest: Effort normal and breath sounds normal. No respiratory distress.  Lymphadenopathy:    He has no cervical adenopathy.  Skin: No rash noted.   SH: + tobacco       Assessment & Plan:

## 2017-06-22 NOTE — Assessment & Plan Note (Signed)
Doing well.  Will have him complete antibiotics June 22 since he has sufficient supply and then transition to Keflex, as above.   Will check a repeat blood cultures 3-5 days after he completes the Keflex

## 2017-06-22 NOTE — Discharge Instructions (Addendum)
Packing for 4 to 5 days before removal. Call Dr. Suszanne Connerseoh for follow-up appointment Return to ER for any significant rebleeding

## 2017-06-22 NOTE — ED Triage Notes (Signed)
LVAD Pt coming in for evaluation of nosebleed that began at 1800 last night; pt has packed his nose with gauze; bleeding controlled at this time

## 2017-06-22 NOTE — Assessment & Plan Note (Signed)
Will have the picc line removed on Saturday after completion of one dose of cefazolin in the am.

## 2017-06-22 NOTE — Telephone Encounter (Signed)
Left message on patient's phone letting him know he should start keflex 500mg , 1 tablet every 6 hours (4 times a day) on Saturdayafter he finishes the cefazolin.  Notified him we would coordinate blood cultures with his LVAD team.  Asked him to call back and confirm.  [facility]. Andree CossHowell, Muriel Wilber M, RN

## 2017-06-24 NOTE — Progress Notes (Signed)
   Subjective:  Patient presents today status post right second toe amputation. DOS: 06/08/17. He states he is doing well overall. He reports nausea but states it is chronic in nature. He denies any new complaints at this time. Patient is here for further evaluation and treatment.    Past Medical History:  Diagnosis Date  . AICD (automatic cardioverter/defibrillator) present   . Arthritis   . Barretts syndrome   . Carotid artery disease (HCC)    a. Dopp 09/2011: 60-79% RICA, 40-59% LICA.;  b.  Carotid US (7/14):  Bilateral 60-79% => f/u 6 mos  . Cerebrovascular disease   . Chronic systolic heart failure (HCC)   . Coronary artery disease    a. AWMI requiring IABP 2005 s/p Horizon study stent-LAD, staged BMS-Cx, DESx2-RCA. b. Last Last LHC (6/06):  EF 25%, pLAD stent 40-50, after stent 40, dLAD 20, pCFX 20, pOM1 70, pRCA 20, mRCA stents ok.=> med Rx;  c.  Myoview (7/14):  EF 26%, large ant, septal, inf and apical infarct, no ischemia.  Med Rx continued  . Diabetes mellitus   . Dyspnea    with exertion  . Headache   . Heart murmur   . History of kidney stones   . HTN (hypertension)   . Hyperlipidemia    mixed  . ICD (implantable cardiac defibrillator), dual, in situ    St. Jude for severe LVD EF 25% 2/07 explanted 2010. Medtronic Virtuoso II DR Dual-chamber cardiverter-defibrillator  with pocket revision, Dr. Graciela HusbandsKlein  . Ischemic cardiomyopathy    a. Echo (09/27/12): EF 20%, diffuse HK with periapical AK, no LV thrombus noted, restrictive physiology, trivial MR, mild LAE, RVSF mildly reduced, PASP 39  . Pleural effusion 01/05/2016  . Presence of permanent cardiac pacemaker   . PVD (peripheral vascular disease) (HCC)    a. ABI 09/2011 - R normal, L moderate - saw Dr. Kirke CorinArida - med rx.   Marland Kitchen. RIATA ICD Lead--on advisory recall    . Tobacco abuse       Objective/Physical Exam Neurovascular status intact.  Skin incisions appear to be well coapted with sutures and staples intact. No sign of  infectious process noted. No dehiscence. No active bleeding noted. Moderate edema noted to the surgical extremity.  Assessment: 1. s/p 2nd toe amputation right. DOS: 06/08/17   Plan of Care:  1. Patient was evaluated. 2. Dry sterile dressing applied. Continue weightbearing in post op shoe.  3. Recommended daily Iodine and dry sterile dressing daily. Patient's father will do the dressing changes.  4. Continue management with infectious disease.  5. Return to clinic in 2 weeks.    Felecia ShellingBrent M. Evans, DPM [facility]  Dr. Felecia ShellingBrent M. Evans, DPM    [address]                                        [place], [place] [address]                Office [phone]  Fax [phone]

## 2017-06-25 ENCOUNTER — Ambulatory Visit (HOSPITAL_COMMUNITY): Payer: Self-pay | Admitting: Pharmacist

## 2017-06-25 ENCOUNTER — Ambulatory Visit (HOSPITAL_COMMUNITY)
Admission: RE | Admit: 2017-06-25 | Discharge: 2017-06-25 | Disposition: A | Discharge status: Home / Self Care | Payer: BLUE CROSS/BLUE SHIELD | Source: Ambulatory Visit | Attending: Internal Medicine | Admitting: Internal Medicine

## 2017-06-25 DIAGNOSIS — Z7901 Long term (current) use of anticoagulants: Secondary | ICD-10-CM | POA: Diagnosis present

## 2017-06-25 DIAGNOSIS — Z95811 Presence of heart assist device: Secondary | ICD-10-CM | POA: Diagnosis not present

## 2017-06-25 LAB — PROTIME-INR
INR: 1.52
PROTHROMBIN TIME: 18.2 s — AB (ref 11.4–15.2)

## 2017-06-26 ENCOUNTER — Other Ambulatory Visit (HOSPITAL_COMMUNITY): Payer: Self-pay | Admitting: *Deleted

## 2017-06-26 DIAGNOSIS — Z7901 Long term (current) use of anticoagulants: Secondary | ICD-10-CM

## 2017-06-26 DIAGNOSIS — Z95811 Presence of heart assist device: Secondary | ICD-10-CM

## 2017-06-29 ENCOUNTER — Ambulatory Visit (INDEPENDENT_AMBULATORY_CARE_PROVIDER_SITE_OTHER): Payer: BLUE CROSS/BLUE SHIELD | Admitting: Vascular Surgery

## 2017-06-29 ENCOUNTER — Ambulatory Visit (HOSPITAL_COMMUNITY): Payer: Self-pay | Admitting: Pharmacist

## 2017-06-29 ENCOUNTER — Encounter: Payer: Self-pay | Admitting: Vascular Surgery

## 2017-06-29 ENCOUNTER — Other Ambulatory Visit (HOSPITAL_COMMUNITY): Payer: Self-pay | Admitting: Unknown Physician Specialty

## 2017-06-29 ENCOUNTER — Ambulatory Visit (HOSPITAL_COMMUNITY)
Admission: RE | Admit: 2017-06-29 | Discharge: 2017-06-29 | Disposition: A | Discharge status: Home / Self Care | Payer: BLUE CROSS/BLUE SHIELD | Source: Ambulatory Visit | Attending: Internal Medicine | Admitting: Internal Medicine

## 2017-06-29 VITALS — BP 100/82 | HR 84 | Temp 97.8°F | Resp 16 | Ht 73.0 in | Wt 137.9 lb

## 2017-06-29 DIAGNOSIS — I739 Peripheral vascular disease, unspecified: Secondary | ICD-10-CM

## 2017-06-29 DIAGNOSIS — Z7901 Long term (current) use of anticoagulants: Secondary | ICD-10-CM | POA: Insufficient documentation

## 2017-06-29 DIAGNOSIS — Z95811 Presence of heart assist device: Secondary | ICD-10-CM | POA: Insufficient documentation

## 2017-06-29 LAB — PROTIME-INR
INR: 1.64
Prothrombin Time: 19.3 seconds — ABNORMAL HIGH (ref 11.4–15.2)

## 2017-06-29 NOTE — Progress Notes (Signed)
Patient name: [name] MRN: [id number] DOB: [birth year] Sex: male  REASON FOR VISIT:   Follow-up  HPI:   [name] is a pleasant 62 y.o. male who I last saw on 09/06/2016.  The patient had a left femoral to below-knee popliteal artery bypass November 2016.  This was with a prosthetic graft as the vein was not adequate.  The patient had had a previous right femoropopliteal bypass in 2015.  The patient had a ventricular assist device placed in May 2018.  When I saw the patient back in September, the left femoropopliteal bypass graft had silently occluded.  The right femoropopliteal bypass graft was patent with monophasic flow proximally however this could be attributed to his left ventricular assist device.  Patient was having minimal symptoms and did not have any wounds.  We discussed the importance of tobacco cessation and he was to come back in 1 year for follow-up studies.  Of note, the patient has an LVAD.  He apparently got an infected right second toe and had to be taken by ambulance to the hospital.  He underwent right second toe amputation by Dr. Amalia Hailey and this has been healing nicely.   Prior to this event the patient denies any significant claudication although I think his activity is fairly limited.  He denies any history of rest pain.  He is trying to cut back on smoking and is now smoking less than a half a pack per day.  He states there are days when he does not smoke at all.  Past Medical History:  Diagnosis Date  . AICD (automatic cardioverter/defibrillator) present   . Arthritis   . Barretts syndrome   . Carotid artery disease ([place])    a. Dopp 09/2011: 80-99% RICA, 83-38% LICA.;  b.  Carotid US (7/14):  Bilateral 60-79% => f/u 6 mos  . Cerebrovascular disease   . Chronic systolic heart failure ([place])   . Coronary artery disease    a. AWMI requiring IABP 2005 s/p Horizon study stent-LAD, staged BMS-Cx, DESx2-RCA. b. Last Last LHC (6/06):  EF 25%, pLAD stent 40-50, after  stent 40, dLAD 20, pCFX 20, pOM1 70, pRCA 20, mRCA stents ok.=> med Rx;  c.  Myoview (7/14):  EF 26%, large ant, septal, inf and apical infarct, no ischemia.  Med Rx continued  . Diabetes mellitus   . Dyspnea    with exertion  . Headache   . Heart murmur   . History of kidney stones   . HTN (hypertension)   . Hyperlipidemia    mixed  . ICD (implantable cardiac defibrillator), dual, in situ    St. Jude for severe LVD EF 25% 2/07 explanted 2010. Medtronic Virtuoso II DR Dual-chamber cardiverter-defibrillator  with pocket revision, Dr. Caryl Comes  . Ischemic cardiomyopathy    a. Echo (09/27/12): EF 20%, diffuse HK with periapical AK, no LV thrombus noted, restrictive physiology, trivial MR, mild LAE, RVSF mildly reduced, PASP 39  . Pleural effusion 01/05/2016  . Presence of permanent cardiac pacemaker   . PVD (peripheral vascular disease) ([facility])    a. ABI 09/2011 - R normal, L moderate - saw Dr. Fletcher Anon - med rx.   Marland Kitchen RIATA ICD Lead--on advisory recall    . Tobacco abuse     Family History  Problem Relation Age of Onset  . Diabetes Mother   . Coronary artery disease Father   . Heart disease Father        before age 85  SOCIAL HISTORY: Social History   Tobacco Use  . Smoking status: Current Every Day Smoker    Packs/day: 0.50    Years: 45.00    Pack years: 22.50    Types: Cigarettes  . Smokeless tobacco: Never Used  . Tobacco comment: pt reports he is stress smoker  Substance Use Topics  . Alcohol use: No    Alcohol/week: 0.0 oz    Allergies  Allergen Reactions  . Metformin And Related Nausea And Vomiting    *Only the extended release* (does this)  . Niacin And Related Other (See Comments)    REACTION IS SIDE EFFECT "SEVERE" FLUSHING    Current Outpatient Medications  Medication Sig Dispense Refill  . atorvastatin (LIPITOR) 80 MG tablet Take 1 tablet (80 mg total) by mouth daily. 30 tablet 6  . blood glucose meter kit and supplies Ultra Blue Kit Dispense based on patient  and insurance preference. Use up to four times daily as directed. (FOR ICD-9 250.00, 250.01). 1 each 0  . calcium carbonate (TUMS EX) 750 MG chewable tablet Chew 2 tablets by mouth as needed for heartburn.    . cephALEXin (KEFLEX) 500 MG capsule Take 1 capsule (500 mg total) by mouth 4 (four) times daily. 56 capsule 0  . danazol (DANOCRINE) 100 MG capsule Take 1 capsule (100 mg total) by mouth 2 (two) times daily. 180 capsule 3  . enoxaparin (LOVENOX) 60 MG/0.6ML injection Inject 0.6 mLs (60 mg total) into the skin every 12 (twelve) hours. 10 Syringe 2  . ezetimibe (ZETIA) 10 MG tablet Take 1 tablet (10 mg total) by mouth daily. 90 tablet 3  . Ferrous Sulfate (IRON) 325 (65 Fe) MG TABS Take 1 tablet (325 mg total) by mouth 2 (two) times daily with a meal. 60 tablet 6  . insulin NPH-regular Human (NOVOLIN 70/30) (70-30) 100 UNIT/ML injection Inject 12 Units into the skin 2 (two) times daily.     Marland Kitchen losartan (COZAAR) 50 MG tablet Take 1 tablet (50 mg total) by mouth 2 (two) times daily. 60 tablet 6  . metoCLOPramide (REGLAN) 5 MG tablet Take 1 tablet (5 mg total) by mouth 3 (three) times daily before meals. (Patient taking differently: Take 5 mg by mouth daily. ) 90 tablet 1  . pantoprazole (PROTONIX) 40 MG tablet Take 1 tablet (40 mg total) by mouth 2 (two) times daily. 180 tablet 6  . potassium chloride SA (K-DUR,KLOR-CON) 20 MEQ tablet Take 1 tablet (20 mEq total) by mouth daily. 90 tablet 6  . sertraline (ZOLOFT) 25 MG tablet Take 1 tablet (25 mg total) by mouth daily. 30 tablet 6  . sildenafil (REVATIO) 20 MG tablet Take 1 tablet (20 mg total) by mouth 3 (three) times daily. (Patient taking differently: Take 20 mg by mouth 2 (two) times daily. ) 90 tablet 11  . spironolactone (ALDACTONE) 25 MG tablet Take 0.5 tablets (12.5 mg total) by mouth daily. 30 tablet 6  . sucralfate (CARAFATE) 1 g tablet Take 1 tablet (1 g total) by mouth 4 (four) times daily -  with meals and at bedtime. (Patient taking  differently: Take 1 g by mouth 2 (two) times daily. ) 120 tablet 6  . torsemide (DEMADEX) 20 MG tablet Take 2 tablets (40 mg total) by mouth daily. 120 tablet 6  . traMADol (ULTRAM) 50 MG tablet Take 1 tablet (50 mg total) by mouth every 6 (six) hours as needed for moderate pain. 30 tablet 0  . warfarin (COUMADIN) 5 MG tablet  5 mg. Take 1 tablet (5 mg) daily    . oxyCODONE-acetaminophen (PERCOCET/ROXICET) 5-325 MG tablet Take 1-2 tablets by mouth every 8 (eight) hours as needed for moderate pain. (Patient not taking: Reported on 06/21/2017) 30 tablet 0   No current facility-administered medications for this visit.     REVIEW OF SYSTEMS:  [X]  denotes positive finding, [ ]  denotes negative finding Cardiac  Comments:  Chest pain or chest pressure:    Shortness of breath upon exertion: x   Short of breath when lying flat:    Irregular heart rhythm:        Vascular    Pain in calf, thigh, or hip brought on by ambulation:    Pain in feet at night that wakes you up from your sleep:     Blood clot in your veins:    Leg swelling:  x       Pulmonary    Oxygen at home:    Productive cough:     Wheezing:         Neurologic    Sudden weakness in arms or legs:     Sudden numbness in arms or legs:     Sudden onset of difficulty speaking or slurred speech:    Temporary loss of vision in one eye:     Problems with dizziness:         Gastrointestinal    Blood in stool:     Vomited blood:         Genitourinary    Burning when urinating:     Blood in urine:        Psychiatric    Major depression:         Hematologic    Bleeding problems: x   Problems with blood clotting too easily:        Skin    Rashes or ulcers:        Constitutional    Fever or chills:     PHYSICAL EXAM:   Vitals:   06/29/17 1124  BP: 100/82  Pulse: 84  Resp: 16  Temp: 97.8 F (36.6 C)  TempSrc: Oral  SpO2: 98%  Weight: 137 lb 14.4 oz (62.6 kg)  Height: 6' 1"  (1.854 m)    GENERAL: The patient is a  well-nourished male, in no acute distress. The vital signs are documented above. CARDIAC: There is a regular rate and rhythm.  VASCULAR: I do not detect carotid bruits. It is difficult to assess his femoral pulses because of his LVAD.  He has monophasic Doppler signals in both feet. PULMONARY: There is good air exchange bilaterally without wheezing or rales. ABDOMEN: Soft and non-tender with normal pitched bowel sounds.  MUSCULOSKELETAL: He is undergone right second toe amputation. NEUROLOGIC: No focal weakness or paresthesias are detected. SKIN:  His right second toe amputation site is healing nicely. He has hyperpigmentation bilaterally consistent with chronic venous insufficiency. PSYCHIATRIC: The patient has a normal affect.  DATA:    ARTERIAL DOPPLER STUDY: I reviewed the arterial Doppler study that was done at the [facility] vascular lab on 06/07/2017.  On the right side the arteries were not compressible and ABIs could not be obtained.    On the left side, the arteries were noncompressible and again ABIs could not be obtained.  ARTERIAL DUPLEX: I reviewed his arterial duplex scan that was done on 06/10/2017.  This showed that his right femoropopliteal bypass graft is patent.  MEDICAL ISSUES:   PERIPHERAL VASCULAR DISEASE: The  patient appears to be healing his right second toe amputation site adequately.  He has a patent right femoropopliteal bypass graft based on his duplex done on 06/07/2017.  He was scheduled to see me back in September for follow-up studies and we will have him keep that appointment.  We have again discussed the importance of tobacco cessation.  Currently I think his circulation in the right foot is adequate to heal the wound.  He would be at high risk for any further intervention given that he now has an LVAD.  I will see him back in September.  He knows to call sooner if he has problems.  Deitra Mayo [facility]  [phone]

## 2017-07-02 ENCOUNTER — Ambulatory Visit (INDEPENDENT_AMBULATORY_CARE_PROVIDER_SITE_OTHER): Payer: BLUE CROSS/BLUE SHIELD | Admitting: Podiatry

## 2017-07-02 ENCOUNTER — Ambulatory Visit (HOSPITAL_COMMUNITY): Payer: Self-pay | Admitting: Pharmacist

## 2017-07-02 ENCOUNTER — Ambulatory Visit (HOSPITAL_COMMUNITY)
Admission: RE | Admit: 2017-07-02 | Discharge: 2017-07-02 | Disposition: A | Discharge status: Home / Self Care | Payer: BLUE CROSS/BLUE SHIELD | Source: Ambulatory Visit | Attending: Internal Medicine | Admitting: Internal Medicine

## 2017-07-02 DIAGNOSIS — I70245 Atherosclerosis of native arteries of left leg with ulceration of other part of foot: Secondary | ICD-10-CM

## 2017-07-02 DIAGNOSIS — L97522 Non-pressure chronic ulcer of other part of left foot with fat layer exposed: Secondary | ICD-10-CM | POA: Diagnosis not present

## 2017-07-02 DIAGNOSIS — Z7901 Long term (current) use of anticoagulants: Secondary | ICD-10-CM | POA: Insufficient documentation

## 2017-07-02 DIAGNOSIS — Z95811 Presence of heart assist device: Secondary | ICD-10-CM | POA: Diagnosis not present

## 2017-07-02 DIAGNOSIS — E0843 Diabetes mellitus due to underlying condition with diabetic autonomic (poly)neuropathy: Secondary | ICD-10-CM | POA: Diagnosis not present

## 2017-07-02 LAB — PROTIME-INR
INR: 1.42
PROTHROMBIN TIME: 17.2 s — AB (ref 11.4–15.2)

## 2017-07-02 MED ORDER — WARFARIN SODIUM 5 MG PO TABS: 5.0000 mg | ORAL_TABLET | Freq: Every day | ORAL | 6 refills | Status: AC

## 2017-07-03 ENCOUNTER — Other Ambulatory Visit (HOSPITAL_COMMUNITY): Payer: Self-pay | Admitting: Unknown Physician Specialty

## 2017-07-03 DIAGNOSIS — Z95811 Presence of heart assist device: Secondary | ICD-10-CM

## 2017-07-03 DIAGNOSIS — Z7901 Long term (current) use of anticoagulants: Secondary | ICD-10-CM

## 2017-07-06 ENCOUNTER — Ambulatory Visit (HOSPITAL_COMMUNITY): Payer: Self-pay | Admitting: Pharmacist

## 2017-07-06 ENCOUNTER — Ambulatory Visit (HOSPITAL_COMMUNITY)
Admission: RE | Admit: 2017-07-06 | Discharge: 2017-07-06 | Disposition: A | Discharge status: Home / Self Care | Payer: BLUE CROSS/BLUE SHIELD | Source: Ambulatory Visit | Attending: Internal Medicine | Admitting: Internal Medicine

## 2017-07-06 DIAGNOSIS — Z95811 Presence of heart assist device: Secondary | ICD-10-CM | POA: Diagnosis not present

## 2017-07-06 DIAGNOSIS — Z7901 Long term (current) use of anticoagulants: Secondary | ICD-10-CM | POA: Insufficient documentation

## 2017-07-06 LAB — PROTIME-INR
INR: 2.09
PROTHROMBIN TIME: 23.3 s — AB (ref 11.4–15.2)

## 2017-07-08 NOTE — Progress Notes (Signed)
Subjective:  Patient presents today status post right second toe amputation. DOS: 06/08/17. He states the incision looks much improved. He notes a new ulceration to the right fourth toe that appeared about one week ago. He states it is from the toenail of the fifth toe rubbing against it. He has been using iodine daily as directed. Patient is here for further evaluation and treatment.    Past Medical History:  Diagnosis Date  . AICD (automatic cardioverter/defibrillator) present   . Arthritis   . Barretts syndrome   . Carotid artery disease ([facility])    a. Dopp 09/2011: 60-79% RICA, 40-59% LICA.;  b.  Carotid US (7/14):  Bilateral 60-79% => f/u 6 mos  . Cerebrovascular disease   . Chronic systolic heart failure ([facility])   . Coronary artery disease    a. AWMI requiring IABP 2005 s/p Horizon study stent-LAD, staged BMS-Cx, DESx2-RCA. b. Last Last LHC (6/06):  EF 25%, pLAD stent 40-50, after stent 40, dLAD 20, pCFX 20, pOM1 70, pRCA 20, mRCA stents ok.=> med Rx;  c.  Myoview (7/14):  EF 26%, large ant, septal, inf and apical infarct, no ischemia.  Med Rx continued  . Diabetes mellitus   . Dyspnea    with exertion  . Headache   . Heart murmur   . History of kidney stones   . HTN (hypertension)   . Hyperlipidemia    mixed  . ICD (implantable cardiac defibrillator), dual, in situ    St. Jude for severe LVD EF 25% 2/07 explanted 2010. Medtronic Virtuoso II DR Dual-chamber cardiverter-defibrillator  with pocket revision, Dr. Graciela HusbandsKlein  . Ischemic cardiomyopathy    a. Echo (09/27/12): EF 20%, diffuse HK with periapical AK, no LV thrombus noted, restrictive physiology, trivial MR, mild LAE, RVSF mildly reduced, PASP 39  . Pleural effusion 01/05/2016  . Presence of permanent cardiac pacemaker   . PVD (peripheral vascular disease) ([facility])    a. ABI 09/2011 - R normal, L moderate - saw Dr. Kirke CorinArida - med rx.   Marland Kitchen. RIATA ICD Lead--on advisory recall    . Tobacco abuse       Objective/Physical Exam Neurovascular  status intact.  Skin incisions appear to be well coapted with sutures and staples intact. No sign of infectious process noted. No dehiscence. No active bleeding noted. Moderate edema noted to the surgical extremity.  Wound #1 noted to the right fourth toe measuring 0.3 x 0.3 x 0.2 cm.  To the above-noted ulceration, there is no eschar. There is a moderate amount of slough, fibrin and necrotic tissue. Granulation tissue and wound base is red. There is no malodor. There is a minimal amount of serosanginous drainage noted. Periwound integrity is intact.   Assessment: 1. s/p 2nd toe amputation right. DOS: 06/08/17 2. Ulceration of the right 4th toe secondary to diabetes mellitus    Plan of Care:  1. Patient was evaluated. 2. Medically necessary excisional debridement including subcutaneous tissue was performed using a tissue nipper and a chisel blade to the ulceration of the right fourth toe. Excisional debridement of all the necrotic nonviable tissue down to healthy bleeding viable tissue was performed with post-debridement measurements same as pre-. 3. The wound was cleansed and dry sterile dressing applied. 4. Sutures removed to the toe amputation site.  5. Continue using iodine daily with a dry sterile dressing.  6. Continue wearing post op shoe.  7. Return to clinic in 3 weeks.     Felecia ShellingBrent M. Mervil Wacker, DPM [facility]  Dr. Edrick Kins, [address]                                        [place], [place] [address]                Office [phone]  Fax [phone]

## 2017-07-09 ENCOUNTER — Other Ambulatory Visit (HOSPITAL_COMMUNITY): Payer: Self-pay | Admitting: *Deleted

## 2017-07-09 DIAGNOSIS — Z95811 Presence of heart assist device: Secondary | ICD-10-CM

## 2017-07-09 DIAGNOSIS — Z7901 Long term (current) use of anticoagulants: Secondary | ICD-10-CM

## 2017-07-09 DIAGNOSIS — B999 Unspecified infectious disease: Secondary | ICD-10-CM

## 2017-07-10 ENCOUNTER — Other Ambulatory Visit (HOSPITAL_COMMUNITY): Payer: Self-pay | Admitting: *Deleted

## 2017-07-10 DIAGNOSIS — B999 Unspecified infectious disease: Secondary | ICD-10-CM

## 2017-07-11 ENCOUNTER — Ambulatory Visit (HOSPITAL_COMMUNITY)
Admission: RE | Admit: 2017-07-11 | Discharge: 2017-07-11 | Disposition: A | Discharge status: Home / Self Care | Payer: BLUE CROSS/BLUE SHIELD | Source: Ambulatory Visit | Attending: Cardiology | Admitting: Cardiology

## 2017-07-11 ENCOUNTER — Ambulatory Visit (HOSPITAL_COMMUNITY): Payer: Self-pay | Admitting: Pharmacist

## 2017-07-11 DIAGNOSIS — B999 Unspecified infectious disease: Secondary | ICD-10-CM | POA: Insufficient documentation

## 2017-07-11 DIAGNOSIS — Z95811 Presence of heart assist device: Secondary | ICD-10-CM

## 2017-07-11 DIAGNOSIS — Z7901 Long term (current) use of anticoagulants: Secondary | ICD-10-CM | POA: Diagnosis present

## 2017-07-11 LAB — CULTURE, BLOOD (ROUTINE X 2)
CULTURE: NO GROWTH
CULTURE: NO GROWTH
SPECIAL REQUESTS: ADEQUATE

## 2017-07-11 LAB — PROTIME-INR
INR: 1.94
Prothrombin Time: 22 seconds — ABNORMAL HIGH (ref 11.4–15.2)

## 2017-07-11 NOTE — Progress Notes (Signed)
Ok, thanks.  I had not realized he was on Augmentin which was probably good.  Hopefully just negative cultures now.

## 2017-07-12 LAB — BLOOD CULTURE ID PANEL (REFLEXED)
Acinetobacter baumannii: NOT DETECTED
CANDIDA TROPICALIS: NOT DETECTED
Candida albicans: NOT DETECTED
Candida glabrata: NOT DETECTED
Candida krusei: NOT DETECTED
Candida parapsilosis: NOT DETECTED
ENTEROBACTERIACEAE SPECIES: NOT DETECTED
ENTEROCOCCUS SPECIES: NOT DETECTED
Enterobacter cloacae complex: NOT DETECTED
Escherichia coli: NOT DETECTED
HAEMOPHILUS INFLUENZAE: NOT DETECTED
KLEBSIELLA PNEUMONIAE: NOT DETECTED
Klebsiella oxytoca: NOT DETECTED
LISTERIA MONOCYTOGENES: NOT DETECTED
METHICILLIN RESISTANCE: NOT DETECTED
NEISSERIA MENINGITIDIS: NOT DETECTED
PROTEUS SPECIES: NOT DETECTED
Pseudomonas aeruginosa: NOT DETECTED
STAPHYLOCOCCUS SPECIES: DETECTED — AB
STREPTOCOCCUS AGALACTIAE: NOT DETECTED
Serratia marcescens: NOT DETECTED
Staphylococcus aureus (BCID): NOT DETECTED
Streptococcus pneumoniae: NOT DETECTED
Streptococcus pyogenes: NOT DETECTED
Streptococcus species: NOT DETECTED

## 2017-07-13 ENCOUNTER — Encounter (HOSPITAL_COMMUNITY): Payer: Self-pay

## 2017-07-13 LAB — CULTURE, BLOOD (ROUTINE X 2): SPECIAL REQUESTS: ADEQUATE

## 2017-07-16 LAB — CULTURE, BLOOD (ROUTINE X 2): Culture: NO GROWTH

## 2017-07-16 NOTE — Progress Notes (Signed)
[employer] Claim submitted on behalf of patient for dismemberment of toe appendage. Faxed forms and supporting documentation to [employer] Claims at provided fax # [phone] Copy of forms scanned into patient's electronic medical record.  [name], Tracie Dore [name], RN

## 2017-07-17 ENCOUNTER — Ambulatory Visit (HOSPITAL_COMMUNITY): Payer: Self-pay | Admitting: Pharmacist

## 2017-07-17 ENCOUNTER — Encounter (HOSPITAL_COMMUNITY): Payer: Self-pay

## 2017-07-17 ENCOUNTER — Other Ambulatory Visit (HOSPITAL_COMMUNITY): Payer: Self-pay | Admitting: *Deleted

## 2017-07-17 ENCOUNTER — Ambulatory Visit (HOSPITAL_COMMUNITY)
Admission: RE | Admit: 2017-07-17 | Discharge: 2017-07-17 | Disposition: A | Discharge status: Home / Self Care | Payer: BLUE CROSS/BLUE SHIELD | Source: Ambulatory Visit | Attending: Cardiology | Admitting: Cardiology

## 2017-07-17 VITALS — BP 111/72 | HR 86 | Ht 72.0 in | Wt 233.6 lb

## 2017-07-17 DIAGNOSIS — Z79899 Other long term (current) drug therapy: Secondary | ICD-10-CM | POA: Diagnosis not present

## 2017-07-17 DIAGNOSIS — K922 Gastrointestinal hemorrhage, unspecified: Secondary | ICD-10-CM | POA: Insufficient documentation

## 2017-07-17 DIAGNOSIS — Z95811 Presence of heart assist device: Secondary | ICD-10-CM

## 2017-07-17 DIAGNOSIS — E785 Hyperlipidemia, unspecified: Secondary | ICD-10-CM | POA: Insufficient documentation

## 2017-07-17 DIAGNOSIS — I5022 Chronic systolic (congestive) heart failure: Secondary | ICD-10-CM

## 2017-07-17 DIAGNOSIS — I2721 Secondary pulmonary arterial hypertension: Secondary | ICD-10-CM | POA: Insufficient documentation

## 2017-07-17 DIAGNOSIS — E1151 Type 2 diabetes mellitus with diabetic peripheral angiopathy without gangrene: Secondary | ICD-10-CM | POA: Insufficient documentation

## 2017-07-17 DIAGNOSIS — J449 Chronic obstructive pulmonary disease, unspecified: Secondary | ICD-10-CM | POA: Insufficient documentation

## 2017-07-17 DIAGNOSIS — I6523 Occlusion and stenosis of bilateral carotid arteries: Secondary | ICD-10-CM | POA: Diagnosis not present

## 2017-07-17 DIAGNOSIS — F17210 Nicotine dependence, cigarettes, uncomplicated: Secondary | ICD-10-CM | POA: Diagnosis not present

## 2017-07-17 DIAGNOSIS — I2510 Atherosclerotic heart disease of native coronary artery without angina pectoris: Secondary | ICD-10-CM | POA: Insufficient documentation

## 2017-07-17 DIAGNOSIS — Z7901 Long term (current) use of anticoagulants: Secondary | ICD-10-CM

## 2017-07-17 DIAGNOSIS — Z89411 Acquired absence of right great toe: Secondary | ICD-10-CM | POA: Insufficient documentation

## 2017-07-17 DIAGNOSIS — R52 Pain, unspecified: Secondary | ICD-10-CM | POA: Diagnosis not present

## 2017-07-17 DIAGNOSIS — I252 Old myocardial infarction: Secondary | ICD-10-CM | POA: Insufficient documentation

## 2017-07-17 DIAGNOSIS — Z794 Long term (current) use of insulin: Secondary | ICD-10-CM

## 2017-07-17 DIAGNOSIS — I110 Hypertensive heart disease with heart failure: Secondary | ICD-10-CM | POA: Insufficient documentation

## 2017-07-17 DIAGNOSIS — E118 Type 2 diabetes mellitus with unspecified complications: Secondary | ICD-10-CM | POA: Diagnosis not present

## 2017-07-17 DIAGNOSIS — J90 Pleural effusion, not elsewhere classified: Secondary | ICD-10-CM | POA: Insufficient documentation

## 2017-07-17 DIAGNOSIS — I5043 Acute on chronic combined systolic (congestive) and diastolic (congestive) heart failure: Secondary | ICD-10-CM

## 2017-07-17 DIAGNOSIS — I255 Ischemic cardiomyopathy: Secondary | ICD-10-CM

## 2017-07-17 DIAGNOSIS — I739 Peripheral vascular disease, unspecified: Secondary | ICD-10-CM

## 2017-07-17 LAB — BASIC METABOLIC PANEL
ANION GAP: 12 (ref 5–15)
BUN: 14 mg/dL (ref 8–23)
CO2: 26 mmol/L (ref 22–32)
Calcium: 9.1 mg/dL (ref 8.9–10.3)
Chloride: 98 mmol/L (ref 98–111)
Creatinine, Ser: 1.25 mg/dL — ABNORMAL HIGH (ref 0.61–1.24)
GFR calc non Af Amer: >60 mL/min (ref >60)
Glucose, Bld: 138 mg/dL — ABNORMAL HIGH (ref 70–99)
POTASSIUM: 4.1 mmol/L (ref 3.5–5.1)
SODIUM: 136 mmol/L (ref 135–145)

## 2017-07-17 LAB — CBC
HEMATOCRIT: 40.7 % (ref 39.0–52.0)
HEMOGLOBIN: 12.4 g/dL — AB (ref 13.0–17.0)
MCH: 26.7 pg (ref 26.0–34.0)
MCHC: 30.5 g/dL (ref 30.0–36.0)
MCV: 87.7 fL (ref 78.0–100.0)
Platelets: 158 10*3/uL (ref 150–400)
RBC: 4.64 MIL/uL (ref 4.22–5.81)
RDW: 16.8 % — AB (ref 11.5–15.5)
WBC: 5.9 10*3/uL (ref 4.0–10.5)

## 2017-07-17 LAB — PROTIME-INR
INR: 1.89
Prothrombin Time: 21.5 seconds — ABNORMAL HIGH (ref 11.4–15.2)

## 2017-07-17 LAB — HEMOGLOBIN A1C
Hgb A1c MFr Bld: 6 % — ABNORMAL HIGH (ref 4.8–5.6)
Mean Plasma Glucose: 125.5 mg/dL

## 2017-07-17 LAB — LACTATE DEHYDROGENASE: LDH: 183 U/L (ref 98–192)

## 2017-07-17 MED ORDER — TRAMADOL HCL 50 MG PO TABS: 50.0000 mg | ORAL_TABLET | Freq: Four times a day (QID) | ORAL | 1 refills | Status: AC | PRN

## 2017-07-17 NOTE — Progress Notes (Addendum)
Patient presents for hospital f/u in VAD clinic. Reports no problems with VAD equipment or concerns with drive line.  Pt denies any VAD pump alarms, dizziness, lightheadedness, or syncopal episodes. He does c/o SOB at times, feels it is de-conditioning. He is still trying to decrease/quit smoking. His daily # of cigarettes "varies".  Pt is not wearing back brace today, says he has been without it for a week or so. He does continue to wear post op shoe on right foot.   Pt recently saw Dr. Amalia Hailey (Foot and Ankle) on 07/02/17 s/p 2nd toe amputation right with instructions to continue weight bearing in post op shoe and continue daily dressing changes. He has f/u with him on 07/25/17.  Pt says he stopped PO Keflex at time of last blood cultures (07/11/17); admits to only taking 500 mg tid instead of qid as instructed. Will not plan to re-start unless otherwise directed by ID.  Pt admits to taking following meds differently than directed:  Sildenafil - takes twice daily  Torsemide - 60 mg on days he takes Spiro 12.5 mg  Torsemide - 40 mg on days he takes Spiro 25 mg   Reglan - not taking and asking to stop   Zoloft - pt stopped taking, feels he "doesn't need it"; discussed with Dr. Aundra Dubin  Pt has f/u with endocrinology tomorrow.  HgbA1C today is 6.0.  Vital Signs:  Doppler Pressure: 90 Automatc BP:  111/72 (91) HR:  86 SPO2: UTO  Weight:  234 lb with equipment Last weight: 236.8 lb w/ back brace   VAD Indication: Destination Therapy - current smoker  VAD interrogation & Equipment Management: Speed: 2700 Flow:  5.1 Power:  4.5 w    Peak/trough:  7.5/2.9 HCT: 30 Alarm settings: 2.0/6.5 Suction: On Lavare: On   Exit Site Care: Drive line is being maintained weekly  by [employer]. Sorbaview dressing dry and intact with anchor on and accurately applied.  Pt denies any issues with drive line site reported by his father. Pt denies fever or chills. Supplied pt with 8 weekly dressing kits for  home use.   Significant Events on VAD Support:  05/31/16 - Controller power-up associated with pump start event. Controller change out performed.  06/01/16 - Controller pins lubricated per St Jude reps  06/17/16 - Controller power-up associated with pump start event while changing power source 06/22/16 - Controller pins re-lubricated per [facility] 07/07/17 - Hospitalization for acute blood loss from upper GIB; EGD with 2 AVMs; start Octreotide 12/20/16 - stopped ASA due to continued GI bleed; GI would not consider colonoscopy unless pt was actively bleeding 04/11/17 - Hospitalization for syncope with L1 burst fracture   Device:St Jude single lead- followed at device clinic Therapies: on at 200 bpm Last check: 05/14/17   BP & Labs:  Doppler BP 90 - Doppler is reflecting MAP   Hgb - 12.4 no S/S of bleeding. Specifically denies melena/BRBPR or nosebleeds.  LDH 183 established baseline of 190- 250. Denies tea-colored urine. No power elevations noted on interrogation.    Patient Instructions:  1. Stop Reglan 2. Do not re-start Keflex (antibiotic) 3. Return to [facility] clinic in 2 months.   Zada Girt RN, BSN VAD Coordinator   Office: [phone] 24/7 Emergency VAD Pager: [phone]    Advanced Heart Failure Clinic Note   PCP: Dr. Kathyrn Lass Cardiology: Dr. Stanford Breed HF Cardiology: Dr. Delilah Shan is a 62 y.o. male smoker with history of PAD, CAD, and ischemic  cardiomyopathy now s/p HVAD placement presents for followup of CHF and LVAD.  Patient was admitted in 1/18 with PNA and acute on chronic systolic CHF.  He was markedly volume overloaded and also had low cardiac output requiring dobutamine use.  He had a right pleural effusion and underwent thoracentesis.  Fluid studies suggested a parapneumonic effusion.  He had cardiac cath, showing 3 vessel disease and markedly elevated filling pressures, right and left.  He was evaluated for CABG but turned down given comorbidities  and lack of venous conduits.  We were able to wean him off dobutamine and discharged him with plan for outpatient PCI once he had recovered from the current hospitalization.   Admitted 01/28/16 - 02/03/16 after presenting for elective 2 vessel PCI of the LAD and OM of the circumflex. RHC at same time showed advanced HF with low cardiac index and severely elevated pressures. Diuresed with IV lasix, metolazone, and milrinone, but refused PICC line to follow coox/CVP. Underwent thoracentesis 02/01/16 with 1.8 L out. Overall diuresed 16 lbs. Discharge weight 214 lbs.  He was admitted in 4/18 with low output HF and severe NYHA class IV symptoms, started on milrinone.  Unable to titrate off milrinone and is now on at home.  He had a right chronic transudative effusion, PleurX catheter was placed.    He had HVAD placed 05/30/16.  He was diuresed post-op and speed was increased to 3000.  It was later dropped to 2940.  Pleurx catheter was removed.    He was admitted in 7/18 with upper GI bleed.  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  He had 3 units PRBCs.  ASA was decreased from 325 to 81 daily and warfarin goal was decreased to 2-2.5.  Octreotide was started but he developed profuse diarrhea that seems to have been related to octreotide.  This was stopped and he was started on danazol.      Admitted 4/10 through 04/16/2017. Admitted for syncope thought to be the result of a vagal episode. Due to cough he had L1 Fracture. Neurosurgery consulted with back brace applied. Due to recurrent N/V GI consulted and he was placed on carafate and reglan.   Admitted 6/5 through 06/12/2017 with MSSA sepsis requiring toe amputation. He had osteomyelitis underwent right 2nd toe amputation. PICC line place for home antibiotics, Ancef x 2 wks then Keflex.    He presents today for LVAD followup.  He is now off antibiotics. Seen by [facility]VS recently, amputation site healing.  Back brace is now off, still has some back  pain but improved.  No LVAD alarms.  No dyspnea walking on flat ground.  Getting out to stores, etc.  No orthopnea/PND. BP down 1 lb.  MAP 90 today. Still smoking unfortunately.   He has calf claudication on left after walking a moderate distance.  This is chronic and followed by VVS. His left fem-pop graft has occluded, plan for medical management as symptoms are mild.    VAD Indication: Destination Therapy - current smoker  VAD interrogation & Equipment Management: See LVAD nurse's note above, reviewed with nurse.    Labs (1/18): K 4.2, creatinine 0.9, hgb 12.6 Labs (2/18): K 4.2, creatinine 1.23, hgb 12.4, BNP 321 Labs (4/18): hgbA1c 8.9, TSH elevated but free T4 normal.  K 3.2, creatinine 0.8 Labs (5/18): digoxin 0.4, K 3.4, creatinine 0.9 Labs (6/18): creatinine 0.67 => 0.98, LDH 209, hgb 8.9 => 9 Labs (7/18): K 2.9, creatinine 1.02, LDL 189, hgb 9 Labs (  8/18): hgb 10 => 10.2, LDH 204, K 3.2, creatinine 0.9 => 1.04, INR 2.45, LDH 204 Labs (9/18): LDH 206, INR 3.26, K 3.5, creatinine 0.91, hgb 11.3 Labs (10/18): LDH 197, hgb 11.4, K 3.1, creatinine 1.12 Labs (11/18): Hgb 10.3, K 4, creatinine 1.0, LDH 196 Labs (12/18): hgb 11 => 7.9 => 8.6, INR 5 Labs (1/19): hgb 10.3, ferritin 62 Labs 04/23/2017: hgb 13.4 WBC 7.2  K 3.3  Labs (6/19): K 3.8, creatinine 1.12, hgb 9.1   PMH: 1. PAD: Bilateral fem-pop bypasses - Arterial dopplers (9/18) with occluded left fem-pop, right fem-pop patent.  - Arterial dopplers (6/19) with occluded left fem-pop, right fem-pop patent.  2. CAD: Anterior MI 2005 => PCI to LAD followed by staged PCI to LCx and RCA.   - LHC (1/18): Diffuse up to 80% calcified stenosis throughout the proximal LAD.  Diffuse disease in distal LAD up to 50%.  Large OM1 totally occluded at the ostium but reconstituted via collaterals.  80% distal RCA stenosis involving the ostia of the PLV and PDA with about 70% stenosis. Patient had PCI with DES to proximal LAD and PTCA OM1  (unable to place stent).  3. Chronic systolic CHF: Ischemic cardiomyopathy.  St Jude ICD.  - Echo (1/18) with EF 20%, mildly decreased RV systolic function.  - RHC (1/18) with mean RA 21, PA 69/31 mean 45, mean PCWP 27, CI 2.37, PVR 3.3 WU.  - Repeat RHC (1/18) with mean RA 16, mean PCWP 21, CI 1.92 - Echo (3/18) with EF 20-25%, mild MR, mild RV dilation with normal systolic function, PASP 47 mmHg.  - 4/18 admission with low output HF and milrinone gtt started (co-ox as low as 42%).  - RHC on milrinone (4/18): mean RA 5, PA 61/24 mean 40, mean PCWP 17, CI 2.1 Fick with PVR 5.1 WU, CI 1.84 thermo with PVR 5.9 WU.  - HVAD placement 05/30/16.  4. Carotid stenosis: Carotid dopplers (8/16) with 38-18% RICA, 29-93% LICA. - Carotid dopplers (2/18) with 40-59% RICA stenosis.  5. Type II diabetes. 6. Hyperlipidemia. 7. HTN 8. COPD:  PFTs (5/18) with moderate to severe obstructive disease.  Active smoker. 9. Chronic right pleural effusion (transudative) with PleurX catheter.  10. Upper GI bleeding: 7/18, enteroscopy with 2 small bowel AVMs (treated).  11. Osteomyelitis with right 2nd toe amputation 6/19.  12. L1 compression fracture.   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  Smokes < 5 cigs/day.  Occasional ETOH.   Review of systems complete and found to be negative unless listed in HPI.    Family History  Problem Relation Age of Onset  . Diabetes Mother   . Coronary artery disease Father   . Heart disease Father        before age 4   Current Outpatient Medications  Medication Sig Dispense Refill  . atorvastatin (LIPITOR) 80 MG tablet Take 1 tablet (80 mg total) by mouth daily. 30 tablet 6  . blood glucose meter kit and supplies Ultra Blue Kit Dispense based on patient and insurance preference. Use up to four times daily as directed. (FOR ICD-9 250.00, 250.01). 1 each 0  . calcium carbonate (TUMS EX) 750 MG chewable tablet Chew 2 tablets by mouth as needed for heartburn.     . danazol (DANOCRINE) 100 MG capsule Take 1 capsule (100 mg total) by mouth 2 (two) times daily. 180 capsule 3  . ezetimibe (ZETIA) 10 MG tablet Take 1 tablet (10 mg total)  by mouth daily. 90 tablet 3  . Ferrous Sulfate (IRON) 325 (65 Fe) MG TABS Take 1 tablet (325 mg total) by mouth 2 (two) times daily with a meal. 60 tablet 6  . insulin NPH-regular Human (NOVOLIN 70/30) (70-30) 100 UNIT/ML injection Inject 12 Units into the skin 2 (two) times daily.     Marland Kitchen losartan (COZAAR) 50 MG tablet Take 1 tablet (50 mg total) by mouth 2 (two) times daily. 60 tablet 6  . pantoprazole (PROTONIX) 40 MG tablet Take 1 tablet (40 mg total) by mouth 2 (two) times daily. 180 tablet 6  . potassium chloride SA (K-DUR,KLOR-CON) 20 MEQ tablet Take 1 tablet (20 mEq total) by mouth daily. 90 tablet 6  . sildenafil (REVATIO) 20 MG tablet Take 1 tablet (20 mg total) by mouth 3 (three) times daily. (Patient taking differently: Take 20 mg by mouth 2 (two) times daily. ) 90 tablet 11  . spironolactone (ALDACTONE) 25 MG tablet Take 0.5 tablets (12.5 mg total) by mouth daily. 30 tablet 6  . sucralfate (CARAFATE) 1 g tablet Take 1 tablet (1 g total) by mouth 4 (four) times daily -  with meals and at bedtime. (Patient taking differently: Take 1 g by mouth 2 (two) times daily. ) 120 tablet 6  . torsemide (DEMADEX) 20 MG tablet Take 2 tablets (40 mg total) by mouth daily. (Patient taking differently: Take 60 mg by mouth daily. ) 120 tablet 6  . traMADol (ULTRAM) 50 MG tablet Take 1 tablet (50 mg total) by mouth every 6 (six) hours as needed for moderate pain. 30 tablet 1  . warfarin (COUMADIN) 5 MG tablet Take 1 tablet (5 mg total) by mouth daily at 6 PM. Take 1 tablet (5 mg) daily or as directed by HF Clinic (Patient taking differently: Take 5 mg by mouth daily at 6 PM. Take 1 tablet (5 mg) daily except 23m Tue/Thur  or as directed by HF Clinic) 60 tablet 6   No current facility-administered medications for this encounter.     Vitals:   07/17/17 1022 07/17/17 1023  BP: (!) 90/0 111/72  Pulse:  86  Weight:  233 lb 9.6 oz (106 kg)  Height:  6' (1.829 m)   Wt Readings from Last 3 Encounters:  07/17/17 233 lb 9.6 oz (106 kg)  06/29/17 137 lb 14.4 oz (62.6 kg)  06/22/17 233 lb (105.7 kg)     Physical Exam: General: Well appearing this am. NAD.  HEENT: Normal. Neck: Supple, JVP 8 cm. Carotids OK.  Cardiac:  Mechanical heart sounds with LVAD hum present.  Lungs:  CTAB, normal effort.  Abdomen:  NT, ND, no HSM. No bruits or masses. +BS  LVAD exit site: Well-healed and incorporated. Dressing dry and intact. No erythema or drainage. Stabilization device present and accurately applied. Driveline dressing changed daily per sterile technique. Extremities:  Warm and dry. No cyanosis, clubbing, rash, or edema.  Chronic lower leg venous stasis changes.  Healing amputation site right 2nd toe.  Neuro:  Alert & oriented x 3. Cranial nerves grossly intact. Moves all 4 extremities w/o difficulty. Affect pleasant      Wt Readings from Last 3 Encounters:  07/17/17 233 lb 9.6 oz (106 kg)  06/29/17 137 lb 14.4 oz (62.6 kg)  06/22/17 233 lb (105.7 kg)   Assessment/Plan: 1. Chronic systolic CHF: Ischemic cardiomyopathy.  Echo in 3/18 with EF 20-25%.  He did not have much effect from 12/17 PCI in terms of symptoms or EF.  St Jude ICD.  He is now s/p HVAD placement.  Lavare cycle is on, parameters stable on device with no alarms.  NYHA class II.  Volume status looks ok.  MAP 90 which is acceptable.  - Continue torsemide 60 mg daily  - Continue spironolactone 12.5 mg daily.   - Continue losartan 50 mg twice a day.  - Continue sildenafil for RV failure, he can only remember to take bid.  - Continue warfarin, goal INR 2-2.5. Off ASA with GI bleeding.  2. CAD: 3 vessel CAD, s/p stent to proximal LAD and PTCA to subtotally occluded large OM1 in 1/18.  - Continue atorvastatin 80 mg daily.   3. PAD: Stable claudication, notes with  moderate walking on left.  Left fem-pop bypass occluded. Most recent dopplers showed patent right fem-pop. - Medical management per Dr. Scot Dock.  4. Carotid stenosis: Followed by VVS.  5. R Pleural effusion: Previously had PleurX catheter. Now resolved.   6. Pulmonary arterial HTN: Continue Revatio 20 mg bid (cannot remember to take tid).  7. Smoking: Discussed smoking cessation.    8. Upper GI bleeding: 7/18, small bowel AVMs.  ASA was decreased to 81 daily.  Warfarin INR goal is now 2-2.5.  He started monthly octreotide injections but had profuse diarrhea with octreotide after 1st injection and after 2nd injection with decreased dose.  He is tolerating danazol without problems. 12/18 had more melena in setting of high INR => due to vigorous epistaxis versus AVM bleeding. ASA was stopped.    - He will stay off ASA 81 mg daily. Continue PPI.  - Continue danazol.  - Warfarin INR 2-2.5.  9. H/O Epistaxis: No further issues with use of regular saline nasal spray.  10. L1 Burst Fracture: Symptoms slowly improving, now out of back brace.   11. R great toe osteomyelitis: S/p R great toe amputation.  - He has completed antibiotic course.   Loralie Champagne, MD  07/17/2017

## 2017-07-17 NOTE — Addendum Note (Signed)
Encounter addended by: Laurey MoraleMcLean, Caran Storck S, MD on: 07/17/2017 11:05 PM  Actions taken: Visit diagnoses modified, LOS modified

## 2017-07-17 NOTE — Patient Instructions (Addendum)
1. Stop Reglan 2. Do not re-start Keflex (antibiotic) 3. Return to VAD clinic in 2 months.

## 2017-07-20 ENCOUNTER — Other Ambulatory Visit (HOSPITAL_COMMUNITY): Payer: Self-pay | Admitting: Unknown Physician Specialty

## 2017-07-20 DIAGNOSIS — Z7901 Long term (current) use of anticoagulants: Secondary | ICD-10-CM

## 2017-07-20 DIAGNOSIS — Z95811 Presence of heart assist device: Secondary | ICD-10-CM

## 2017-07-24 ENCOUNTER — Ambulatory Visit (HOSPITAL_COMMUNITY)
Admission: RE | Admit: 2017-07-24 | Discharge: 2017-07-24 | Disposition: A | Discharge status: Home / Self Care | Payer: BLUE CROSS/BLUE SHIELD | Source: Ambulatory Visit | Attending: Cardiology | Admitting: Cardiology

## 2017-07-24 ENCOUNTER — Ambulatory Visit (HOSPITAL_COMMUNITY): Payer: Self-pay | Admitting: Pharmacist

## 2017-07-24 DIAGNOSIS — Z95811 Presence of heart assist device: Secondary | ICD-10-CM | POA: Diagnosis not present

## 2017-07-24 DIAGNOSIS — Z7901 Long term (current) use of anticoagulants: Secondary | ICD-10-CM | POA: Diagnosis present

## 2017-07-24 LAB — PROTIME-INR
INR: 2.88
Prothrombin Time: 30 seconds — ABNORMAL HIGH (ref 11.4–15.2)

## 2017-07-25 ENCOUNTER — Ambulatory Visit (INDEPENDENT_AMBULATORY_CARE_PROVIDER_SITE_OTHER): Payer: BLUE CROSS/BLUE SHIELD | Admitting: Podiatry

## 2017-07-25 DIAGNOSIS — I70245 Atherosclerosis of native arteries of left leg with ulceration of other part of foot: Secondary | ICD-10-CM | POA: Diagnosis not present

## 2017-07-25 DIAGNOSIS — L97522 Non-pressure chronic ulcer of other part of left foot with fat layer exposed: Secondary | ICD-10-CM

## 2017-07-25 DIAGNOSIS — E0843 Diabetes mellitus due to underlying condition with diabetic autonomic (poly)neuropathy: Secondary | ICD-10-CM | POA: Diagnosis not present

## 2017-07-27 NOTE — Progress Notes (Signed)
Subjective:  62 year old male presenting today status post right second toe amputation. DOS: 06/08/17. He is here for follow up evaluation of an ulceration of the amputation site of the 2nd toe. He also notes an ulceration of the fourth toe of the right foot. He has been using Iodine daily with a dry sterile dressing as well as using the post op shoe. There are no modifying factors noted. Patient is here for further evaluation and treatment.   Past Medical History:  Diagnosis Date  . AICD (automatic cardioverter/defibrillator) present   . Arthritis   . Barretts syndrome   . Carotid artery disease (HCC)    a. Dopp 09/2011: 60-79% RICA, 40-59% LICA.;  b.  Carotid US (7/14):  Bilateral 60-79% => f/u 6 mos  . Cerebrovascular disease   . Chronic systolic heart failure (HCC)   . Coronary artery disease    a. AWMI requiring IABP 2005 s/p Horizon study stent-LAD, staged BMS-Cx, DESx2-RCA. b. Last Last LHC (6/06):  EF 25%, pLAD stent 40-50, after stent 40, dLAD 20, pCFX 20, pOM1 70, pRCA 20, mRCA stents ok.=> med Rx;  c.  Myoview (7/14):  EF 26%, large ant, septal, inf and apical infarct, no ischemia.  Med Rx continued  . Diabetes mellitus   . Dyspnea    with exertion  . Headache   . Heart murmur   . History of kidney stones   . HTN (hypertension)   . Hyperlipidemia    mixed  . ICD (implantable cardiac defibrillator), dual, in situ    St. Jude for severe LVD EF 25% 2/07 explanted 2010. Medtronic Virtuoso II DR Dual-chamber cardiverter-defibrillator  with pocket revision, Dr. Graciela HusbandsKlein  . Ischemic cardiomyopathy    a. Echo (09/27/12): EF 20%, diffuse HK with periapical AK, no LV thrombus noted, restrictive physiology, trivial MR, mild LAE, RVSF mildly reduced, PASP 39  . Pleural effusion 01/05/2016  . Presence of permanent cardiac pacemaker   . PVD (peripheral vascular disease) (HCC)    a. ABI 09/2011 - R normal, L moderate - saw Dr. Kirke CorinArida - med rx.   Marland Kitchen. RIATA ICD Lead--on advisory recall    .  Tobacco abuse       Objective/Physical Exam General: The patient is alert and oriented x3 in no acute distress.  Dermatology:  Wound #1 noted to the right 2nd toe amputation site measuring 0.8 x 0.5 x 0.2 cm (LxWxD).   Wound #2 noted to the right 4th toe measuring 0.5 x 0.5 x 0.1 cm.  To the noted ulceration(s), there is no eschar. There is a moderate amount of slough, fibrin, and necrotic tissue noted. Granulation tissue and wound base is red. There is a minimal amount of serosanguineous drainage noted. There is no exposed bone muscle-tendon ligament or joint. There is no malodor. Periwound integrity is intact. Skin is warm, dry and supple bilateral lower extremities.  Vascular: Palpable pedal pulses bilaterally. No edema or erythema noted. Capillary refill within normal limits.  Neurological: Epicritic and protective threshold diminished bilaterally.   Musculoskeletal Exam: Range of motion within normal limits to all pedal and ankle joints bilateral. Muscle strength 5/5 in all groups bilateral.   Assessment: #1 ulceration of the right 2nd toe amputation site secondary to diabetes mellitus #2 ulceration of the right 4th toe secondary to diabetes mellitus  #3 diabetes mellitus w/ peripheral neuropathy   Plan of Care:  #1 Patient was evaluated. #2 medically necessary excisional debridement including subcutaneous tissue was performed using a tissue nipper  and a chisel blade. Excisional debridement of all the necrotic nonviable tissue down to healthy bleeding viable tissue was performed with post-debridement measurements same as pre-. #3 the wound was cleansed and dry sterile dressing applied. #4 Continue using Iodine daily with a dry sterile dressing.  #5 Continue using post op shoe.  #6 patient is to return to clinic in 4 weeks.   Felecia Shelling, DPM [facility]  Dr. Felecia Shelling, DPM    [address]                                        [place],  [place] [address]                Office [phone]  Fax [phone]

## 2017-07-31 ENCOUNTER — Inpatient Hospital Stay (HOSPITAL_COMMUNITY)
Admission: EM | Admit: 2017-07-31 | Discharge: 2017-08-02 | DRG: 023 | Disposition: E | Payer: BLUE CROSS/BLUE SHIELD | Attending: Cardiology | Admitting: Cardiology

## 2017-07-31 ENCOUNTER — Encounter (HOSPITAL_COMMUNITY): Payer: Self-pay | Admitting: Emergency Medicine

## 2017-07-31 ENCOUNTER — Inpatient Hospital Stay (HOSPITAL_COMMUNITY): Payer: BLUE CROSS/BLUE SHIELD

## 2017-07-31 ENCOUNTER — Other Ambulatory Visit: Payer: Self-pay

## 2017-07-31 ENCOUNTER — Emergency Department (HOSPITAL_COMMUNITY): Payer: BLUE CROSS/BLUE SHIELD

## 2017-07-31 DIAGNOSIS — M25512 Pain in left shoulder: Secondary | ICD-10-CM | POA: Diagnosis not present

## 2017-07-31 DIAGNOSIS — M542 Cervicalgia: Secondary | ICD-10-CM | POA: Diagnosis present

## 2017-07-31 DIAGNOSIS — Z9889 Other specified postprocedural states: Secondary | ICD-10-CM | POA: Diagnosis not present

## 2017-07-31 DIAGNOSIS — I110 Hypertensive heart disease with heart failure: Secondary | ICD-10-CM | POA: Diagnosis present

## 2017-07-31 DIAGNOSIS — G9349 Other encephalopathy: Secondary | ICD-10-CM | POA: Diagnosis not present

## 2017-07-31 DIAGNOSIS — R402142 Coma scale, eyes open, spontaneous, at arrival to emergency department: Secondary | ICD-10-CM | POA: Diagnosis present

## 2017-07-31 DIAGNOSIS — Z978 Presence of other specified devices: Secondary | ICD-10-CM

## 2017-07-31 DIAGNOSIS — W0110XA Fall on same level from slipping, tripping and stumbling with subsequent striking against unspecified object, initial encounter: Secondary | ICD-10-CM | POA: Diagnosis present

## 2017-07-31 DIAGNOSIS — I609 Nontraumatic subarachnoid hemorrhage, unspecified: Secondary | ICD-10-CM | POA: Diagnosis not present

## 2017-07-31 DIAGNOSIS — I50812 Chronic right heart failure: Secondary | ICD-10-CM | POA: Diagnosis present

## 2017-07-31 DIAGNOSIS — Z888 Allergy status to other drugs, medicaments and biological substances status: Secondary | ICD-10-CM

## 2017-07-31 DIAGNOSIS — Z794 Long term (current) use of insulin: Secondary | ICD-10-CM

## 2017-07-31 DIAGNOSIS — Z87442 Personal history of urinary calculi: Secondary | ICD-10-CM

## 2017-07-31 DIAGNOSIS — X58XXXD Exposure to other specified factors, subsequent encounter: Secondary | ICD-10-CM | POA: Diagnosis not present

## 2017-07-31 DIAGNOSIS — I255 Ischemic cardiomyopathy: Secondary | ICD-10-CM | POA: Diagnosis present

## 2017-07-31 DIAGNOSIS — Z79891 Long term (current) use of opiate analgesic: Secondary | ICD-10-CM

## 2017-07-31 DIAGNOSIS — N179 Acute kidney failure, unspecified: Secondary | ICD-10-CM | POA: Diagnosis not present

## 2017-07-31 DIAGNOSIS — Z66 Do not resuscitate: Secondary | ICD-10-CM | POA: Diagnosis not present

## 2017-07-31 DIAGNOSIS — Z9049 Acquired absence of other specified parts of digestive tract: Secondary | ICD-10-CM

## 2017-07-31 DIAGNOSIS — S81812A Laceration without foreign body, left lower leg, initial encounter: Secondary | ICD-10-CM

## 2017-07-31 DIAGNOSIS — Q2733 Arteriovenous malformation of digestive system vessel: Secondary | ICD-10-CM | POA: Diagnosis not present

## 2017-07-31 DIAGNOSIS — W19XXXA Unspecified fall, initial encounter: Secondary | ICD-10-CM

## 2017-07-31 DIAGNOSIS — E1151 Type 2 diabetes mellitus with diabetic peripheral angiopathy without gangrene: Secondary | ICD-10-CM | POA: Diagnosis not present

## 2017-07-31 DIAGNOSIS — Z515 Encounter for palliative care: Secondary | ICD-10-CM | POA: Diagnosis not present

## 2017-07-31 DIAGNOSIS — Z95811 Presence of heart assist device: Secondary | ICD-10-CM

## 2017-07-31 DIAGNOSIS — E876 Hypokalemia: Secondary | ICD-10-CM | POA: Diagnosis not present

## 2017-07-31 DIAGNOSIS — R402362 Coma scale, best motor response, obeys commands, at arrival to emergency department: Secondary | ICD-10-CM | POA: Diagnosis present

## 2017-07-31 DIAGNOSIS — Z7901 Long term (current) use of anticoagulants: Secondary | ICD-10-CM | POA: Diagnosis not present

## 2017-07-31 DIAGNOSIS — J9601 Acute respiratory failure with hypoxia: Secondary | ICD-10-CM | POA: Diagnosis not present

## 2017-07-31 DIAGNOSIS — Z955 Presence of coronary angioplasty implant and graft: Secondary | ICD-10-CM

## 2017-07-31 DIAGNOSIS — S066X0A Traumatic subarachnoid hemorrhage without loss of consciousness, initial encounter: Principal | ICD-10-CM | POA: Diagnosis present

## 2017-07-31 DIAGNOSIS — S06340D Traumatic hemorrhage of right cerebrum without loss of consciousness, subsequent encounter: Secondary | ICD-10-CM | POA: Diagnosis not present

## 2017-07-31 DIAGNOSIS — I5022 Chronic systolic (congestive) heart failure: Secondary | ICD-10-CM | POA: Diagnosis present

## 2017-07-31 DIAGNOSIS — Y92009 Unspecified place in unspecified non-institutional (private) residence as the place of occurrence of the external cause: Secondary | ICD-10-CM | POA: Diagnosis not present

## 2017-07-31 DIAGNOSIS — Z95828 Presence of other vascular implants and grafts: Secondary | ICD-10-CM

## 2017-07-31 DIAGNOSIS — Z89411 Acquired absence of right great toe: Secondary | ICD-10-CM

## 2017-07-31 DIAGNOSIS — R402252 Coma scale, best verbal response, oriented, at arrival to emergency department: Secondary | ICD-10-CM | POA: Diagnosis present

## 2017-07-31 DIAGNOSIS — F17210 Nicotine dependence, cigarettes, uncomplicated: Secondary | ICD-10-CM | POA: Diagnosis present

## 2017-07-31 DIAGNOSIS — D689 Coagulation defect, unspecified: Secondary | ICD-10-CM | POA: Diagnosis not present

## 2017-07-31 DIAGNOSIS — Z8673 Personal history of transient ischemic attack (TIA), and cerebral infarction without residual deficits: Secondary | ICD-10-CM

## 2017-07-31 DIAGNOSIS — S06340A Traumatic hemorrhage of right cerebrum without loss of consciousness, initial encounter: Secondary | ICD-10-CM

## 2017-07-31 DIAGNOSIS — S32011D Stable burst fracture of first lumbar vertebra, subsequent encounter for fracture with routine healing: Secondary | ICD-10-CM

## 2017-07-31 DIAGNOSIS — I2510 Atherosclerotic heart disease of native coronary artery without angina pectoris: Secondary | ICD-10-CM | POA: Diagnosis present

## 2017-07-31 DIAGNOSIS — E782 Mixed hyperlipidemia: Secondary | ICD-10-CM | POA: Diagnosis not present

## 2017-07-31 DIAGNOSIS — E669 Obesity, unspecified: Secondary | ICD-10-CM | POA: Diagnosis present

## 2017-07-31 DIAGNOSIS — Z79899 Other long term (current) drug therapy: Secondary | ICD-10-CM

## 2017-07-31 DIAGNOSIS — I2721 Secondary pulmonary arterial hypertension: Secondary | ICD-10-CM | POA: Diagnosis not present

## 2017-07-31 DIAGNOSIS — Z9181 History of falling: Secondary | ICD-10-CM

## 2017-07-31 DIAGNOSIS — Z6831 Body mass index (BMI) 31.0-31.9, adult: Secondary | ICD-10-CM

## 2017-07-31 DIAGNOSIS — M25511 Pain in right shoulder: Secondary | ICD-10-CM | POA: Diagnosis not present

## 2017-07-31 DIAGNOSIS — Z833 Family history of diabetes mellitus: Secondary | ICD-10-CM

## 2017-07-31 DIAGNOSIS — Z95810 Presence of automatic (implantable) cardiac defibrillator: Secondary | ICD-10-CM

## 2017-07-31 DIAGNOSIS — Z0189 Encounter for other specified special examinations: Secondary | ICD-10-CM

## 2017-07-31 DIAGNOSIS — Z8739 Personal history of other diseases of the musculoskeletal system and connective tissue: Secondary | ICD-10-CM

## 2017-07-31 DIAGNOSIS — I619 Nontraumatic intracerebral hemorrhage, unspecified: Secondary | ICD-10-CM | POA: Diagnosis present

## 2017-07-31 DIAGNOSIS — Z8249 Family history of ischemic heart disease and other diseases of the circulatory system: Secondary | ICD-10-CM

## 2017-07-31 DIAGNOSIS — R296 Repeated falls: Secondary | ICD-10-CM | POA: Diagnosis present

## 2017-07-31 DIAGNOSIS — Z8619 Personal history of other infectious and parasitic diseases: Secondary | ICD-10-CM

## 2017-07-31 LAB — BPAM RBC
BLOOD PRODUCT EXPIRATION DATE: 201908182359
Blood Product Expiration Date: 201908192359
ISSUE DATE / TIME: 201907301525
ISSUE DATE / TIME: 201907301525
UNIT TYPE AND RH: 9500
Unit Type and Rh: 9500

## 2017-07-31 LAB — PROTIME-INR
INR: 4
INR: 3.97
INR: 1.29
PROTHROMBIN TIME: 38.7 s — AB (ref 11.4–15.2)
PROTHROMBIN TIME: 16 s — AB (ref 11.4–15.2)
Prothrombin Time: 38.4 seconds — ABNORMAL HIGH (ref 11.4–15.2)

## 2017-07-31 LAB — CBC WITH DIFFERENTIAL/PLATELET
BASOS ABS: 0 10*3/uL (ref 0.0–0.1)
Basophils Relative: 0 %
EOS ABS: 0.2 10*3/uL (ref 0.0–0.7)
Eosinophils Relative: 3 %
HCT: 32.3 % — ABNORMAL LOW (ref 39.0–52.0)
Hemoglobin: 10.2 g/dL — ABNORMAL LOW (ref 13.0–17.0)
LYMPHS PCT: 8 %
Lymphs Abs: 0.5 10*3/uL — ABNORMAL LOW (ref 0.7–4.0)
MCH: 26.4 pg (ref 26.0–34.0)
MCHC: 31.6 g/dL (ref 30.0–36.0)
MCV: 83.5 fL (ref 78.0–100.0)
MONOS PCT: 9 %
Monocytes Absolute: 0.5 10*3/uL (ref 0.1–1.0)
NEUTROS ABS: 4.9 10*3/uL (ref 1.7–7.7)
Neutrophils Relative %: 80 %
PLATELETS: 144 10*3/uL — AB (ref 150–400)
RBC: 3.87 MIL/uL — AB (ref 4.22–5.81)
RDW: 16.2 % — AB (ref 11.5–15.5)
WBC: 6.1 10*3/uL (ref 4.0–10.5)

## 2017-07-31 LAB — BASIC METABOLIC PANEL
Anion gap: 11 (ref 5–15)
BUN: 25 mg/dL — AB (ref 8–23)
CALCIUM: 8.4 mg/dL — AB (ref 8.9–10.3)
CO2: 22 mmol/L (ref 22–32)
Chloride: 99 mmol/L (ref 98–111)
Creatinine, Ser: 1.74 mg/dL — ABNORMAL HIGH (ref 0.61–1.24)
GFR calc Af Amer: 47 mL/min — ABNORMAL LOW (ref >60)
GFR, EST NON AFRICAN AMERICAN: 40 mL/min — AB (ref >60)
Glucose, Bld: 135 mg/dL — ABNORMAL HIGH (ref 70–99)
Potassium: 3.7 mmol/L (ref 3.5–5.1)
SODIUM: 132 mmol/L — AB (ref 135–145)

## 2017-07-31 LAB — TYPE AND SCREEN
ABO/RH(D): A POS
Antibody Screen: NEGATIVE

## 2017-07-31 LAB — MRSA PCR SCREENING: MRSA BY PCR: NEGATIVE

## 2017-07-31 LAB — CBG MONITORING, ED: GLUCOSE-CAPILLARY: 179 mg/dL — AB (ref 70–99)

## 2017-07-31 LAB — LACTATE DEHYDROGENASE: LDH: 161 U/L (ref 98–192)

## 2017-07-31 LAB — GLUCOSE, CAPILLARY: Glucose-Capillary: 149 mg/dL — ABNORMAL HIGH (ref 70–99)

## 2017-07-31 MED ORDER — LIDOCAINE-EPINEPHRINE (PF) 1 %-1:200000 IJ SOLN
INTRAMUSCULAR | Status: AC
  Filled 2017-07-31: qty 30

## 2017-07-31 MED ORDER — VITAMIN D (ERGOCALCIFEROL) 1.25 MG (50000 UNIT) PO CAPS: 50000.0000 [IU] | ORAL_CAPSULE | ORAL | Status: DC

## 2017-07-31 MED ORDER — ACETAMINOPHEN 325 MG PO TABS
650.0000 mg | ORAL_TABLET | ORAL | Status: DC | PRN
  Filled 2017-07-31: qty 2

## 2017-07-31 MED ORDER — ONDANSETRON HCL 4 MG/2ML IJ SOLN
INTRAMUSCULAR | Status: AC
  Administered 2017-07-31: 4 mg
  Filled 2017-07-31: qty 2

## 2017-07-31 MED ORDER — PHYTONADIONE 5 MG PO TABS
5.0000 mg | ORAL_TABLET | Freq: Once | ORAL | Status: AC
  Administered 2017-07-31: 5 mg via ORAL
  Filled 2017-07-31: qty 1

## 2017-07-31 MED ORDER — DANAZOL 100 MG PO CAPS
100.0000 mg | ORAL_CAPSULE | Freq: Two times a day (BID) | ORAL | Status: DC
  Administered 2017-07-31 (×2): 100 mg via ORAL
  Filled 2017-07-31 (×3): qty 1

## 2017-07-31 MED ORDER — ONDANSETRON HCL 4 MG/2ML IJ SOLN
4.0000 mg | Freq: Four times a day (QID) | INTRAMUSCULAR | Status: DC | PRN
  Administered 2017-07-31 (×2): 4 mg via INTRAVENOUS
  Filled 2017-07-31 (×2): qty 2

## 2017-07-31 MED ORDER — SODIUM CHLORIDE 0.9% IV SOLUTION
Freq: Once | INTRAVENOUS | Status: AC
  Administered 2017-07-31: 17:00:00 via INTRAVENOUS

## 2017-07-31 MED ORDER — EMPTY CONTAINERS FLEXIBLE MISC
2022.0000 [IU] | Freq: Once | Status: AC
  Administered 2017-07-31: 2022 [IU] via INTRAVENOUS
  Filled 2017-07-31: qty 2022

## 2017-07-31 MED ORDER — PANTOPRAZOLE SODIUM 40 MG PO TBEC
40.0000 mg | DELAYED_RELEASE_TABLET | Freq: Two times a day (BID) | ORAL | Status: DC
  Administered 2017-07-31 (×2): 40 mg via ORAL
  Filled 2017-07-31 (×2): qty 1

## 2017-07-31 MED ORDER — ONDANSETRON HCL 4 MG/5ML PO SOLN
4.0000 mg | Freq: Once | ORAL | Status: DC
  Filled 2017-07-31: qty 1

## 2017-07-31 MED ORDER — MORPHINE SULFATE (PF) 2 MG/ML IV SOLN
INTRAVENOUS | Status: AC
  Filled 2017-07-31: qty 1

## 2017-07-31 MED ORDER — SILDENAFIL CITRATE 20 MG PO TABS: 20.0000 mg | ORAL_TABLET | Freq: Three times a day (TID) | ORAL | Status: DC

## 2017-07-31 MED ORDER — PROTHROMBIN COMPLEX CONC HUMAN 500 UNITS IV KIT
2000.0000 [IU] | PACK | Freq: Once | Status: DC
  Filled 2017-07-31: qty 2000

## 2017-07-31 MED ORDER — SODIUM CHLORIDE 0.9% IV SOLUTION
Freq: Once | INTRAVENOUS | Status: AC
  Administered 2017-07-31: 18:00:00 via INTRAVENOUS

## 2017-07-31 MED ORDER — SUCRALFATE 1 G PO TABS
1.0000 g | ORAL_TABLET | Freq: Two times a day (BID) | ORAL | Status: DC
  Administered 2017-07-31 (×2): 1 g via ORAL
  Filled 2017-07-31 (×3): qty 1

## 2017-07-31 MED ORDER — MORPHINE SULFATE (PF) 2 MG/ML IV SOLN
1.0000 mg | INTRAVENOUS | Status: DC | PRN
  Administered 2017-07-31: 2 mg via INTRAVENOUS

## 2017-07-31 MED ORDER — HYDRALAZINE HCL 20 MG/ML IJ SOLN
INTRAMUSCULAR | Status: AC
  Filled 2017-07-31: qty 1

## 2017-07-31 MED ORDER — SODIUM CHLORIDE 0.9 % IV BOLUS: 500.0000 mL | Freq: Once | INTRAVENOUS | Status: DC

## 2017-07-31 MED ORDER — EZETIMIBE 10 MG PO TABS
10.0000 mg | ORAL_TABLET | Freq: Every day | ORAL | Status: DC
  Administered 2017-07-31: 10 mg via ORAL
  Filled 2017-07-31: qty 1

## 2017-07-31 MED ORDER — SODIUM CHLORIDE 0.9 % IV SOLN
INTRAVENOUS | Status: DC | PRN
  Administered 2017-08-01 (×2): via INTRA_ARTERIAL

## 2017-07-31 MED ORDER — FERROUS SULFATE 325 (65 FE) MG PO TABS
325.0000 mg | ORAL_TABLET | Freq: Two times a day (BID) | ORAL | Status: DC
  Administered 2017-07-31: 325 mg via ORAL
  Filled 2017-07-31: qty 1

## 2017-07-31 MED ORDER — HYDRALAZINE HCL 20 MG/ML IJ SOLN
10.0000 mg | Freq: Once | INTRAMUSCULAR | Status: AC
  Administered 2017-07-31: 10 mg via INTRAVENOUS

## 2017-07-31 MED ORDER — SILDENAFIL CITRATE 20 MG PO TABS: 20.0000 mg | ORAL_TABLET | Freq: Two times a day (BID) | ORAL | Status: DC

## 2017-07-31 MED ORDER — ATORVASTATIN CALCIUM 80 MG PO TABS
80.0000 mg | ORAL_TABLET | Freq: Every day | ORAL | Status: DC
  Administered 2017-07-31: 80 mg via ORAL
  Filled 2017-07-31: qty 1

## 2017-07-31 MED ORDER — ONDANSETRON 4 MG PO TBDP: 4.0000 mg | ORAL_TABLET | Freq: Once | ORAL | Status: DC

## 2017-07-31 MED ORDER — SODIUM CHLORIDE 0.9% IV SOLUTION: Freq: Once | INTRAVENOUS | Status: DC

## 2017-07-31 MED ORDER — TRAMADOL HCL 50 MG PO TABS
50.0000 mg | ORAL_TABLET | Freq: Four times a day (QID) | ORAL | Status: DC | PRN
  Administered 2017-07-31: 50 mg via ORAL
  Filled 2017-07-31: qty 1

## 2017-07-31 MED ORDER — VITAMIN D (ERGOCALCIFEROL) 1.25 MG (50000 UNIT) PO CAPS
50000.0000 [IU] | ORAL_CAPSULE | ORAL | Status: DC
  Filled 2017-07-31: qty 1

## 2017-07-31 NOTE — ED Notes (Addendum)
LVAD Map 74

## 2017-07-31 NOTE — H&P (Addendum)
Advanced Heart Failure VAD History and Physical Note   PCP-Cardiologist: No primary care provider on file.   Reason for Admission: [place] in LVAD patient.   HPI:    [name] is a 62 y.o. 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   Last seen in [place] clinic 07/17/17 and was doing well overall. No changes.   Pt presented to [facility] via EMS this am after fall. Per pt, he tripped and fell. Pt's primary complaint was a skin tear on his left shin. He also c/o neck and bilateral shoulder pain. Bleeding well controlled per notes. Noted bruising above R eye noted that gradually worsened in ED. Head CT with SAH and left frontal lobe contusion as below. Pertinent labs on admission include INR 4.00, Cr up to 1.74 from baseline of 1.0-2.0, K 3.7, Hgb 10.2. Received vitamin k orally   Prior to his arrival to the hospital, the patients father called and updated the LVAD team. Per his father, [name] fell backwards on Sunday and hit his head. Since then he has been having intermittent episodes of blacking out, with no clear trigger (prior to one episode he was getting milk from the refrigerator). Pt denies any episodes in addition to the episode Sunday, and the episode this morning. That said he had a previous fall where he fractured his back but said his feet got tangled up.   This morning, his father found him face down on the floor, and the patient stated that he transiently "couldn't move his arms or legs".  Pt was then able to get up into the chair.  He denies any recent illness or changes to his medical regiment.  He does state he had lightheadedness before each fall, but denies any loss of conscioussness  Head CT WO contrast 07/16/2017 - Subarachnoid hemorrhage at the anterior interhemispheric fissure, at sulci in the anterior frontal lobes bilaterally, and at the  anterior aspect of the RIGHT middle cranial fossa. - Small hemorrhagic contusion of the LEFT frontal lobe. - No definite acute facial bone abnormalities. - Small amount of nonspecific fluid in the sphenoid sinus and within the LEFT mastoid air cells. - Degenerative disc and facet disease changes of the cervical spine. - No acute cervical spine abnormalities.  On arrival to [facility] ICU LVAD interogated as below with 2 suction events corresponding to fall times. ICD interrogated personally. One brief episode NSVT in May. Nothing since.   HVAD parameters RPM 2700 Hct 40 Peak 6.1 Trough 3.2 Suction On. 2 suction events, around the time of both of his falls.   Review of Systems: [y] = yes, [ ]  = no   General: Weight gain [ ] ; Weight loss [ ] ; Anorexia [ ] ; Fatigue Blue.Reese ]; Fever [ ] ; Chills [ ] ; Weakness [ ]   Cardiac: Chest pain/pressure [ ] ; Resting SOB [ ] ; Exertional SOB [ ] ; Orthopnea [ ] ; Pedal Edema [y]; Palpitations [ ] ; Syncope [ ] ; Presyncope [ ] ; Paroxysmal nocturnal dyspnea[ ]   Pulmonary: Cough [ ] ; Wheezing[ ] ; Hemoptysis[ ] ; Sputum [ ] ; Snoring [ ]   GI: Vomiting[ ] ; Dysphagia[ ] ; Melena[ ] ; Hematochezia [ ] ; Heartburn[ ] ; Abdominal pain [ ] ; Constipation [ ] ; Diarrhea [ ] ; BRBPR [ ]   GU: Hematuria[ ] ; Dysuria [ ] ; Nocturia[ ]   Vascular: Pain in legs with walking [ ] ; Pain in feet with lying  flat [ ] ; Non-healing sores [ ] ; Stroke [ ] ; TIA [ ] ; Slurred speech [ ] ;  Neuro: Headaches[ y]; Vertigo[ ] ; Seizures[ ] ; Paresthesias[ ] ;Blurred vision [ ] ; Diplopia [ ] ; Vision changes [ ]   Ortho/Skin: Arthritis [y]; Joint pain [y]; Muscle pain [ ] ; Joint swelling [ ] ; Back Pain [y]; Rash [ ]   Psych: Depression[ ] ; Anxiety[ ]   Heme: Bleeding problems [ ] ; Clotting disorders [ ] ; Anemia [ ]   Endocrine: Diabetes [ ] ; Thyroid dysfunction[ ]     Home Medications Prior to Admission medications   Medication Sig Start Date End Date Taking? Authorizing Provider  atorvastatin (LIPITOR) 80 MG tablet Take 1  tablet (80 mg total) by mouth daily. 04/17/17  Yes Clegg, Amy D, NP  blood glucose meter kit and supplies Ultra Blue Kit Dispense based on patient and insurance preference. Use up to four times daily as directed. (FOR ICD-9 250.00, 250.01). 08/04/16  Yes Larey Dresser, MD  calcium carbonate (TUMS EX) 750 MG chewable tablet Chew 2 tablets by mouth as needed for heartburn.   Yes [provider]  danazol (DANOCRINE) 100 MG capsule Take 1 capsule (100 mg total) by mouth 2 (two) times daily. 05/18/17  Yes Shirley Friar, PA-C  ezetimibe (ZETIA) 10 MG tablet Take 1 tablet (10 mg total) by mouth daily. 05/04/17  Yes Larey Dresser, MD  Ferrous Sulfate (IRON) 325 (65 Fe) MG TABS Take 1 tablet (325 mg total) by mouth 2 (two) times daily with a meal. 06/12/17  Yes Clegg, Amy D, NP  insulin NPH-regular Human (NOVOLIN 70/30) (70-30) 100 UNIT/ML injection Inject 12 Units into the skin 2 (two) times daily.    Yes [provider]  losartan (COZAAR) 50 MG tablet Take 1 tablet (50 mg total) by mouth 2 (two) times daily. 06/12/17  Yes Clegg, Amy D, NP  pantoprazole (PROTONIX) 40 MG tablet Take 1 tablet (40 mg total) by mouth 2 (two) times daily. 06/13/17  Yes Larey Dresser, MD  potassium chloride SA (K-DUR,KLOR-CON) 20 MEQ tablet Take 1 tablet (20 mEq total) by mouth daily. 06/12/17  Yes Clegg, Amy D, NP  sildenafil (REVATIO) 20 MG tablet Take 1 tablet (20 mg total) by mouth 3 (three) times daily. Patient taking differently: Take 20 mg by mouth 2 (two) times daily.  05/15/17  Yes Larey Dresser, MD  spironolactone (ALDACTONE) 25 MG tablet Take 0.5 tablets (12.5 mg total) by mouth daily. 06/13/17  Yes Clegg, Amy D, NP  sucralfate (CARAFATE) 1 g tablet Take 1 tablet (1 g total) by mouth 4 (four) times daily -  with meals and at bedtime. Patient taking differently: Take 1 g by mouth 2 (two) times daily.  04/16/17  Yes Clegg, Amy D, NP  torsemide (DEMADEX) 20 MG tablet Take 2 tablets (40 mg total)  by mouth daily. Patient taking differently: Take 60 mg by mouth daily.  06/13/17  Yes Larey Dresser, MD  traMADol (ULTRAM) 50 MG tablet Take 1 tablet (50 mg total) by mouth every 6 (six) hours as needed for moderate pain. 07/17/17  Yes Larey Dresser, MD  Vitamin D, Ergocalciferol, (DRISDOL) 50000 units CAPS capsule Take 1 capsule by mouth once a week. 07/25/17  Yes [provider]  warfarin (COUMADIN) 5 MG tablet Take 1 tablet (5 mg total) by mouth daily at 6 PM. Take 1 tablet (5 mg) daily or as directed by HF Clinic Patient taking differently: Take 5 mg by mouth daily at 6 PM.  Take 1 tablet (5 mg) daily except 35m Tue/Thur  or as directed by HF Clinic 07/02/17  Yes MLarey Dresser MD    Past Medical History: Past Medical History:  Diagnosis Date  . AICD (automatic cardioverter/defibrillator) present   . Arthritis   . Barretts syndrome   . Carotid artery disease ([facility]    a. Dopp 09/2011: 637-10%RICA, 462-69%LICA.;  b.  Carotid [place](7/14):  Bilateral 60-79% => f/u 6 mos  . Cerebrovascular disease   . Chronic systolic heart failure ([facility]   . Coronary artery disease    a. AWMI requiring IABP 2005 s/p Horizon study stent-LAD, staged BMS-Cx, DESx2-RCA. b. Last Last LHC (6/06):  EF 25%, pLAD stent 40-50, after stent 40, dLAD 20, pCFX 20, pOM1 70, pRCA 20, mRCA stents ok.=> med Rx;  c.  Myoview (7/14):  EF 26%, large ant, septal, inf and apical infarct, no ischemia.  Med Rx continued  . Diabetes mellitus   . Dyspnea    with exertion  . Headache   . Heart murmur   . History of kidney stones   . HTN (hypertension)   . Hyperlipidemia    mixed  . ICD (implantable cardiac defibrillator), dual, in situ    St. Jude for severe LVD EF 25% 2/07 explanted 2010. Medtronic Virtuoso II DR Dual-chamber cardiverter-defibrillator  with pocket revision, Dr. KCaryl Comes . Ischemic cardiomyopathy    a. Echo (09/27/12): EF 20%, diffuse HK with periapical AK, no LV thrombus noted, restrictive physiology,  trivial MR, mild LAE, RVSF mildly reduced, PASP 39  . Pleural effusion 01/05/2016  . Presence of permanent cardiac pacemaker   . PVD (peripheral vascular disease) (HCC)    a. ABI 09/2011 - R normal, L moderate - saw Dr. AFletcher Anon- med rx.   .Marland KitchenRIATA ICD Lead--on advisory recall    . Tobacco abuse     Past Surgical History: Past Surgical History:  Procedure Laterality Date  . ABDOMINAL AORTAGRAM N/A 11/06/2013   Procedure: ABDOMINAL AMaxcine Ham  Surgeon: CAngelia Mould MD;  [facility];  Service: Cardiovascular;  Laterality: N/A;  . AMPUTATION TOE Right 06/08/2017   Procedure: AMPUTATION TOE;  Surgeon: EEdrick Kins DPM;  [place]  Service: Podiatry;  Laterality: Right;  . APPENDECTOMY  2000   ruptured  . CARDIAC CATHETERIZATION N/A 01/07/2016   Procedure: Right/Left Heart Cath and Coronary Angiography;  Surgeon: DLarey Dresser MD;  [place];  Service: Cardiovascular;  Laterality: N/A;  . CARDIAC CATHETERIZATION N/A 01/28/2016   Procedure: Coronary Stent Intervention;  Surgeon: MSherren Mocha MD;  [place] [facility];  Service: Cardiovascular;  Laterality: N/A;  . CARDIAC CATHETERIZATION N/A 01/28/2016   Procedure: Right Heart Cath;  Surgeon: MSherren Mocha MD;  [facility];  Service: Cardiovascular;  Laterality: N/A;  . CARDIAC CATHETERIZATION N/A 01/28/2016   Procedure: Intravascular Ultrasound/IVUS;  Surgeon: MSherren Mocha MD;  [place];  Service: Cardiovascular;  Laterality: N/A;  . CHEST TUBE INSERTION Right 04/07/2016   Procedure: INSERTION PLEURAL DRAINAGE CATHETER;  Surgeon: PIvin Poot MD;  [place]  Service: Thoracic;  Laterality: Right;  . ENTEROSCOPY N/A 07/10/2016   Procedure: ENTEROSCOPY;  Surgeon: ELaurence Spates MD;  [facility];  Service: Endoscopy;  Laterality: N/A;  . FEMORAL-POPLITEAL BYPASS GRAFT Right 11/07/2013   Procedure: Right FEMORAL-POPLITEAL ARTERY Bypass  ;  Surgeon: CAngelia Mould MD;  [place]  Service: Vascular;  Laterality: Right;  . FEMORAL-POPLITEAL BYPASS GRAFT Left 11/17/2014   Procedure: BYPASS GRAFT FEMORAL-POPLITEAL ARTERY USING GORE PROPATEN 6MM X 80CM VASCULAR GRAFT;  Surgeon: Angelia Mould, MD;  Location: [place];  Service: Vascular;  Laterality: Left;  . HOT HEMOSTASIS N/A 07/10/2016   Procedure: HOT HEMOSTASIS (ARGON PLASMA COAGULATION/BICAP);  Surgeon: Laurence Spates, MD;  Location[facility]COPY;  Service: Endoscopy;  Laterality: N/A;  . IMPLANTABLE CARDIOVERTER DEFIBRILLATOR (ICD) GENERATOR CHANGE N/A 12/16/2012   Procedure: ICD GENERATOR CHANGE;  Surgeon: Deboraha Sprang, MD;  [facility];  Service: Cardiovascular;  Laterality: N/A;  . INSERTION OF IMPLANTABLE LEFT VENTRICULAR ASSIST DEVICE N/A 05/30/2016   Procedure: INSERTION OF IMPLANTABLE LEFT VENTRICULAR ASSIST DEVICE;  Surgeon: Ivin Poot, MD;  Location: [place];  Service: Open Heart Surgery;  Laterality: N/A;  CIRC ARREST  NITRIC OXIDE  . INTRAOPERATIVE ARTERIOGRAM Right 11/07/2013   Procedure: INTRA OPERATIVE ARTERIOGRAM;  Surgeon: Angelia Mould, MD;  Location: [place];  Service: Vascular;  Laterality: Right;  . LOWER EXTREMITY ANGIOGRAM Bilateral 11/06/2013   Procedure: LOWER EXTREMITY ANGIOGRAM;  Surgeon: Angelia Mould, MD;  Location: [facility] CATH LAB;  Service: Cardiovascular;  Laterality: Bilateral;  . Medtronic Virtuoso II DR dual-chamber cardioverter-defibrillation with pocket revison     Dr. Virl Axe  . MULTIPLE EXTRACTIONS WITH ALVEOLOPLASTY N/A 04/26/2016   Procedure: Extraction of tooth #'s 5-14 and 20-30 with alveoloplasty;  Surgeon: Lenn Cal, DDS;  Location: [place];  Service: Oral Surgery;  Laterality: N/A;  . PERIPHERAL VASCULAR CATHETERIZATION N/A 09/25/2014   Procedure: Abdominal Aortogram;  Surgeon: Elam Dutch, MD;  Location: [place] CV LAB;  Service: Cardiovascular;  Laterality: N/A;  . REMOVAL  OF PLEURAL DRAINAGE CATHETER Right 06/13/2016   Procedure: REMOVAL OF PLEURAL DRAINAGE CATHETER;  Surgeon: Ivin Poot, MD;  Location: [place];  Service: Thoracic;  Laterality: Right;  . RIGHT HEART CATH N/A 05/01/2016   Procedure: Right Heart Cath;  Surgeon: Larey Dresser, MD;  Location: [place] CV LAB;  Service: Cardiovascular;  Laterality: N/A;  . RIGHT HEART CATH N/A 05/25/2016   Procedure: Right Heart Cath;  Surgeon: Larey Dresser, MD;  Location: [place] CV LAB;  Service: Cardiovascular;  Laterality: N/A;  . TEE WITHOUT CARDIOVERSION N/A 05/30/2016   Procedure: TRANSESOPHAGEAL ECHOCARDIOGRAM (TEE);  Surgeon: Prescott Gum, Collier Salina, MD;  Location: [place];  Service: Open Heart Surgery;  Laterality: N/A;  . TEE WITHOUT CARDIOVERSION N/A 06/11/2017   Procedure: TRANSESOPHAGEAL ECHOCARDIOGRAM (TEE);  Surgeon: Larey Dresser, MD;  Location: [facility] ENDOSCOPY;  Service: Cardiovascular;  Laterality: N/A;  . US GUIDED THORACENTESIS RIGHT ([place] HX) Right 01/05/2016    Family History: Family History  Problem Relation Age of Onset  . Diabetes Mother   . Coronary artery disease Father   . Heart disease Father        before age 52    Social History: Social History   Socioeconomic History  . Marital status: Divorced    Spouse name: Not on file  . Number of children: 2  . Years of education: Not on file  . Highest education level: Not on file  Occupational History  . 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 Financial resource strain: Not on file  . Food insecurity:    Worry: Not on file    Inability: Not on file  . Transportation needs:    Medical:  Not on file    Non-medical: Not on file  Tobacco Use  . Smoking status: Current Every Day Smoker    Packs/day: 0.50    Years: 45.00    Pack years: 22.50    Types: Cigarettes  . Smokeless tobacco: Never Used  . Tobacco comment: pt reports he is stress smoker  Substance and  Sexual Activity  . Alcohol use: No    Alcohol/week: 0.0 oz  . Drug use: No    Comment: former Cocain, Acid, Marijuana - 25 years ago  . Sexual activity: Never  Lifestyle  . Physical activity:    Days per week: Not on file    Minutes per session: Not on file  . Stress: Not on file  Relationships  . Social connections:    Talks on phone: Not on file    Gets together: Not on file    Attends religious service: Not on file    Active member of club or organization: Not on file    Attends meetings of clubs or organizations: Not on file    Relationship status: Not on file  Other Topics Concern  . Not on file  Social History Narrative   Divorced. 2 children.   Previously worked at [employer].    Allergies:  Allergies  Allergen Reactions  . Metformin And Related Nausea And Vomiting    *Only the extended release* (does this)  . Niacin And Related Other (See Comments)    REACTION IS SIDE EFFECT "SEVERE" FLUSHING    Objective:    Vital Signs:   Temp:  [99.1 F (37.3 C)] 99.1 F (37.3 C) (07/30 0930) Pulse Rate:  [39-80] 80 (07/30 0933) Resp:  [18] 18 (07/30 0931) BP: (67-86)/(55-63) 67/55 (07/30 0933) SpO2:  [98 %-99 %] 98 % (07/30 0933) Weight:  [233 lb (105.7 kg)] 233 lb (105.7 kg) (07/30 0928)   Filed Weights   07/29/2017 0928  Weight: 233 lb (105.7 kg)   Mean arterial Pressure 58  Physical Exam    General: Fatigued appearing.  HEENT: RIGHT periorbital ecchymosis. Otherwise normal  Neck: supple. JVP 7-8 cm. Carotids 2+ bilat; no bruits. No lymphadenopathy or thyromegaly appreciated. Cor: Mechanical heart sounds with LVAD hum present. Lungs: Clear no wheeze Abdomen: soft, nontender, nondistended. No hepatosplenomegaly. No bruits or masses. Good bowel sounds. Driveline: C/D/I; securement device intact and driveline incorporated Extremities: no cyanosis, clubbing, rash, or edema. BLE wrists with wound dressings. Several abrasions  Neuro: alert & orientedx3, cranial  nerves grossly intact. moves all 4 extremities w/o difficulty. Affect flat  Telemetry   Not yet connected.  EKG   No new tracings.    Labs    Basic Metabolic Panel: Recent Labs  Lab 07/12/2017 1020  NA 132*  K 3.7  CL 99  CO2 22  GLUCOSE 135*  BUN 25*  CREATININE 1.74*  CALCIUM 8.4*    Liver Function Tests: No results for input(s): AST, ALT, ALKPHOS, BILITOT, PROT, ALBUMIN in the last 168 hours. No results for input(s): LIPASE, AMYLASE in the last 168 hours. No results for input(s): AMMONIA in the last 168 hours.  CBC: No results for input(s): WBC, NEUTROABS, HGB, HCT, MCV, PLT in the last 168 hours.  Cardiac Enzymes: No results for input(s): CKTOTAL, CKMB, CKMBINDEX, TROPONINI in the last 168 hours.  BNP: BNP (last 3 results) No results for input(s): BNP in the last 8760 hours.  ProBNP (last 3 results) No results for input(s): PROBNP in the last 8760 hours.  CBG: No results for input(s): GLUCAP in the last 168 hours.  Coagulation Studies: Recent Labs    07/21/2017 1020  LABPROT 38.7*  INR 4.00   Imaging    Ct Head Wo Contrast  Result Date: 07/29/2017 CLINICAL DATA:  Tripped and fell this morning, bruising above RIGHT eye, also fell a few weeks ago, on Coumadin, history chronic systolic heart failure, coronary artery disease, diabetes mellitus, hypertension, ischemic cardiomyopathy, smoker EXAM: CT HEAD WITHOUT CONTRAST CT MAXILLOFACIAL WITHOUT CONTRAST CT CERVICAL SPINE WITHOUT CONTRAST TECHNIQUE: Multidetector CT imaging of the head, cervical spine, and maxillofacial structures were performed using the standard protocol without intravenous contrast. Multiplanar CT image reconstructions of the cervical spine and maxillofacial structures were also generated. Right side of face marked with BB. COMPARISON:  CT head 04/11/2017, 10/28/2010 FINDINGS: CT HEAD FINDINGS Brain: Generalized atrophy. Normal ventricular morphology. No midline shift. Subarachnoid blood  identified in the anterior interhemispheric fissure and at multiple sulci in the anterior frontal lobes bilaterally. Additional small amount of subarachnoid blood at the anterior aspect of the RIGHT middle cranial fossa. Small hemorrhagic contusion of the LEFT frontal lobe anteriorly. No evidence of acute infarction or mass lesion. Vascular: Atherosclerotic calcifications of the internal carotid arteries bilaterally at skull base Skull: Calvaria intact Other: N/A CT MAXILLOFACIAL FINDINGS Osseous: Nasal septum midline. Nasal bones and orbits intact. TMJ alignment normal bilaterally. No facial bone fractures identified. Small lucent focus within the lateral wall of the RIGHT orbit minimally increased since 2012. Orbits: Intraorbital soft tissue planes clear. Significant RIGHT periorbital contusion/hematoma. Sinuses: Small air-fluid level LEFT sphenoid sinus. Small amount of fluid within a few inferior LEFT mastoid air cells. Remaining paranasal sinuses and mastoid air cells clear. Middle ear cavities clear bilaterally. Partial opacification of LEFT external auditory canal. Soft tissues: RIGHT periorbital contusion/hematoma. Remaining facial soft tissues unremarkable. Atherosclerotic calcifications of the carotid bifurcations bilaterally. CT CERVICAL SPINE FINDINGS Alignment: Normal Skull base and vertebrae: Vertebral body heights maintained. Disc space narrowing C4-C5 and C5-C6 with endplate spur formation. Visualized skull base intact. No fracture, subluxation or bone destruction. Scattered facet degenerative changes. Soft tissues and spinal canal: Prevertebral soft tissues normal thickness. Atherosclerotic calcifications at the carotid bifurcations. Remaining visualized soft tissues unremarkable. Disc levels:  Minimally bulging disc at C4-C5. Upper chest: Lung apices clear Other: LEFT side pacemaker leads noted. IMPRESSION: Subarachnoid hemorrhage at the anterior interhemispheric fissure, at sulci in the anterior  frontal lobes bilaterally, and at the anterior aspect of the RIGHT middle cranial fossa. Small hemorrhagic contusion of the LEFT frontal lobe. No definite acute facial bone abnormalities. Small amount of nonspecific fluid in the sphenoid sinus and within the LEFT mastoid air cells. Degenerative disc and facet disease changes of the cervical spine. No acute cervical spine abnormalities. Critical Value/emergent results were called by telephone at the time of interpretation on 07/15/2017 at 11:46 am to Dr. Carmin Muskrat , who verbally acknowledged these results. Electronically Signed   By: Lavonia Dana M.D.   On: 07/28/2017 11:49   Ct Cervical Spine Wo Contrast  Result Date: 07/30/2017 CLINICAL DATA:  Tripped and fell this morning, bruising above RIGHT eye, also fell a few weeks ago, on Coumadin, history chronic systolic heart failure, coronary artery disease, diabetes mellitus, hypertension, ischemic cardiomyopathy, smoker EXAM: CT HEAD WITHOUT CONTRAST CT MAXILLOFACIAL WITHOUT CONTRAST CT CERVICAL SPINE WITHOUT CONTRAST TECHNIQUE: Multidetector CT imaging of the head, cervical spine, and maxillofacial structures were performed using the standard protocol without intravenous contrast. Multiplanar CT image reconstructions of the cervical  spine and maxillofacial structures were also generated. Right side of face marked with BB. COMPARISON:  CT head 04/11/2017, 10/28/2010 FINDINGS: CT HEAD FINDINGS Brain: Generalized atrophy. Normal ventricular morphology. No midline shift. Subarachnoid blood identified in the anterior interhemispheric fissure and at multiple sulci in the anterior frontal lobes bilaterally. Additional small amount of subarachnoid blood at the anterior aspect of the RIGHT middle cranial fossa. Small hemorrhagic contusion of the LEFT frontal lobe anteriorly. No evidence of acute infarction or mass lesion. Vascular: Atherosclerotic calcifications of the internal carotid arteries bilaterally at skull  base Skull: Calvaria intact Other: N/A CT MAXILLOFACIAL FINDINGS Osseous: Nasal septum midline. Nasal bones and orbits intact. TMJ alignment normal bilaterally. No facial bone fractures identified. Small lucent focus within the lateral wall of the RIGHT orbit minimally increased since 2012. Orbits: Intraorbital soft tissue planes clear. Significant RIGHT periorbital contusion/hematoma. Sinuses: Small air-fluid level LEFT sphenoid sinus. Small amount of fluid within a few inferior LEFT mastoid air cells. Remaining paranasal sinuses and mastoid air cells clear. Middle ear cavities clear bilaterally. Partial opacification of LEFT external auditory canal. Soft tissues: RIGHT periorbital contusion/hematoma. Remaining facial soft tissues unremarkable. Atherosclerotic calcifications of the carotid bifurcations bilaterally. CT CERVICAL SPINE FINDINGS Alignment: Normal Skull base and vertebrae: Vertebral body heights maintained. Disc space narrowing C4-C5 and C5-C6 with endplate spur formation. Visualized skull base intact. No fracture, subluxation or bone destruction. Scattered facet degenerative changes. Soft tissues and spinal canal: Prevertebral soft tissues normal thickness. Atherosclerotic calcifications at the carotid bifurcations. Remaining visualized soft tissues unremarkable. Disc levels:  Minimally bulging disc at C4-C5. Upper chest: Lung apices clear Other: LEFT side pacemaker leads noted. IMPRESSION: Subarachnoid hemorrhage at the anterior interhemispheric fissure, at sulci in the anterior frontal lobes bilaterally, and at the anterior aspect of the RIGHT middle cranial fossa. Small hemorrhagic contusion of the LEFT frontal lobe. No definite acute facial bone abnormalities. Small amount of nonspecific fluid in the sphenoid sinus and within the LEFT mastoid air cells. Degenerative disc and facet disease changes of the cervical spine. No acute cervical spine abnormalities. Critical Value/emergent results were  called by telephone at the time of interpretation on 07/10/2017 at 11:46 am to Dr. Carmin Muskrat , who verbally acknowledged these results. Electronically Signed   By: Lavonia Dana M.D.   On: 07/15/2017 11:49   Ct Maxillofacial Wo Contrast  Result Date: 07/05/2017 CLINICAL DATA:  Tripped and fell this morning, bruising above RIGHT eye, also fell a few weeks ago, on Coumadin, history chronic systolic heart failure, coronary artery disease, diabetes mellitus, hypertension, ischemic cardiomyopathy, smoker EXAM: CT HEAD WITHOUT CONTRAST CT MAXILLOFACIAL WITHOUT CONTRAST CT CERVICAL SPINE WITHOUT CONTRAST TECHNIQUE: Multidetector CT imaging of the head, cervical spine, and maxillofacial structures were performed using the standard protocol without intravenous contrast. Multiplanar CT image reconstructions of the cervical spine and maxillofacial structures were also generated. Right side of face marked with BB. COMPARISON:  CT head 04/11/2017, 10/28/2010 FINDINGS: CT HEAD FINDINGS Brain: Generalized atrophy. Normal ventricular morphology. No midline shift. Subarachnoid blood identified in the anterior interhemispheric fissure and at multiple sulci in the anterior frontal lobes bilaterally. Additional small amount of subarachnoid blood at the anterior aspect of the RIGHT middle cranial fossa. Small hemorrhagic contusion of the LEFT frontal lobe anteriorly. No evidence of acute infarction or mass lesion. Vascular: Atherosclerotic calcifications of the internal carotid arteries bilaterally at skull base Skull: Calvaria intact Other: N/A CT MAXILLOFACIAL FINDINGS Osseous: Nasal septum midline. Nasal bones and orbits intact. TMJ alignment normal bilaterally. No  facial bone fractures identified. Small lucent focus within the lateral wall of the RIGHT orbit minimally increased since 2012. Orbits: Intraorbital soft tissue planes clear. Significant RIGHT periorbital contusion/hematoma. Sinuses: Small air-fluid level LEFT  sphenoid sinus. Small amount of fluid within a few inferior LEFT mastoid air cells. Remaining paranasal sinuses and mastoid air cells clear. Middle ear cavities clear bilaterally. Partial opacification of LEFT external auditory canal. Soft tissues: RIGHT periorbital contusion/hematoma. Remaining facial soft tissues unremarkable. Atherosclerotic calcifications of the carotid bifurcations bilaterally. CT CERVICAL SPINE FINDINGS Alignment: Normal Skull base and vertebrae: Vertebral body heights maintained. Disc space narrowing C4-C5 and C5-C6 with endplate spur formation. Visualized skull base intact. No fracture, subluxation or bone destruction. Scattered facet degenerative changes. Soft tissues and spinal canal: Prevertebral soft tissues normal thickness. Atherosclerotic calcifications at the carotid bifurcations. Remaining visualized soft tissues unremarkable. Disc levels:  Minimally bulging disc at C4-C5. Upper chest: Lung apices clear Other: LEFT side pacemaker leads noted. IMPRESSION: Subarachnoid hemorrhage at the anterior interhemispheric fissure, at sulci in the anterior frontal lobes bilaterally, and at the anterior aspect of the RIGHT middle cranial fossa. Small hemorrhagic contusion of the LEFT frontal lobe. No definite acute facial bone abnormalities. Small amount of nonspecific fluid in the sphenoid sinus and within the LEFT mastoid air cells. Degenerative disc and facet disease changes of the cervical spine. No acute cervical spine abnormalities. Critical Value/emergent results were called by telephone at the time of interpretation on 07/07/2017 at 11:46 am to Dr. Carmin Muskrat , who verbally acknowledged these results. Electronically Signed   By: Lavonia Dana M.D.   On: 07/27/2017 11:49       Patient Profile:   ARMEND HOCHSTATTER is a 62 y.o. 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   Presented to [place] 07/18/2017 with fall. Found to have [place] on Head CT and transferred to [facility] for further evaluation and treatment.   Assessment/Plan:    1. SAH - Discussed personally with Dr. Christella Noa with [facility]. His concern is contusions noted on CT may continue to worsen and recommends reversing his INR. Long discussion about concern for thrombosis in LVAD/HVAD so will have to address his anticoagulation very carefully moving forward.  - Given 5 mg Vitamin K and will give 2 units of FFP.   - Repeat non-contrast CT 6 hrs post first CT based on Neurosurgical recommendations.  2. Falls - Pt had suctions events on HVAD around the time of his 2 main episodes - Will have ICD interrogated for possible VT component.  3. Chronic systolic CHF: Ischemic cardiomyopathy.  Echo in 3/18 with EF 20-25%.  He did not have much effect from 12/17 PCI in terms of symptoms or EF.  St Jude ICD.  He is now s/p HVAD placement.  Lavare cycle is on, - Volume status OK on exam if not a bit low - Hold torsemide for now.  - Adjust meds prn with SAH. Arlyce Harman and losartan held initially.  - Continue sildenafil for RV failure, he can only remember to take bid.  - On warfarin with goal INR 2-2.5. Off ASA with GI bleeding. INR 4.0 on admit. Will discus with neurosurgery.  4. CAD: 3 vessel CAD, s/p stent to proximal LAD and PTCA to subtotally occluded large OM1 in 1/18.  - No s/s of ischemia.    - Continue  atorvastatin 80 mg daily.   5. PAD: -  Stable. Notes with moderate walking on left.  Left fem-pop bypass occluded. Most recent dopplers showed patent right fem-pop. - Medical management per Dr. Scot Dock.  6. Carotid stenosis:  - Followed by [facility].  7. R Pleural effusion:  - Previously had PleurX catheter. Resolved.  8. Pulmonary arterial HTN:  - Continue Revatio 20 mg BID for now.  9. Smoking:  - Encouraged complete cessation.  10. Upper GI bleeding:  - 7/18, small bowel  AVMs.  ASA was decreased to 81 daily.  Warfarin INR goal is now 2-2.5.  He started monthly octreotide injections but had profuse diarrhea with octreotide after 1st injection and after 2nd injection with decreased dose.  He is tolerating danazol without problems. 12/18 had more melena in setting of high INR => due to vigorous epistaxis versus AVM bleeding. ASA was stopped.    - He will stay off ASA 81 mg daily. Continue PPI.  - Continue danazol.  - Hold warfarin or SAH 11. L1 Burst Fracture:  - Symptoms had been improving. Suspect contributed to fall.  12. R great toe osteomyelitis:  - S/p R great toe amputation. Stable.   I reviewed the HVAD parameters from today, and compared the results to the patient's prior recorded data.  No programming changes were made.  The HVAD is functioning within specified parameters.    Length of Stay: 0  Annamaria Helling 07/12/2017, 12:30 PM  VAD Team Pager [phone] (7am - 7am) +++VAD ISSUES ONLY+++   Advanced Heart Failure Team Pager [phone] (M-F; [place])  Please contact [facility] for night-coverage after hours (4p -7a ) and weekends on [email or web address] for all non- LVAD Issues  62 y/o male with iCM s/p Heartware LVAD now with recurrent falls leading to traumatic brain contusion and SAH. LVAD and ICD interrogated personally with VAD team. Falls seem to correlate with suction events on VAD and BP currently low. We have d/w Dr. Christella Noa at bedside and will reverse INR with FFP to keep INR ~ 1.5 for at least 48 hours. Will repeat head CT in 6 hours. No need for operative intervention currently. Hold diuretics. VAD speed turned down to 2660. Will follow closely in ICU. D/w Dr. Aundra Dubin. May benefit from midodrine down the road to reduce the risk of falls.  Glori Bickers, MD  5:06 PM

## 2017-07-31 NOTE — Progress Notes (Addendum)
LVAD Coordinator Rounding Note:  Admitted 07/23/2017 from [facility] after falling at home. CT shows SAH. He says he is having pain on the top of his head, and his neck. He has a blackened right eye.   [employer] Jude rep at bedside interrogating device.     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 Maren BeachVanTrigt under Destination Therapy criteria due to current smoking status.  Vital signs: Temp: 98.7 HR: 86 Doppler Pressure: 70 Automatic BP: 80/69 (74)   O2 Sat: 100 RA Wt: 233lb  HVAD Interrogation Reveals: Speed: 2660 (decreased speed from 2700 per Dr. Shirlee LatchMcLean) Flow: 4.8 Power: 4.4w Peak/Trough: 6.3/3.9 Alarms: 2 suction alarms. Both events preceded his syncopal episodes. Log sent to [employer] Flow alarm limit: 2.5 Power alarm limit: 6.5 Hematocrit: 32 Suction alarm: on Lavare cycle: on  Drive Line:  Left abdominal sorbaview dressing dry and intact; anchor intact and accurately applied. Weekly dressings; bedside RN may change dressing has needed.   Labs:  LDH trend: 161  INR trend: 4>3.97  WBC: 6.1  Anticoagulation Plan: -INR Goal: 2.0 - 2.5 -ASA Dose: no ASA due to hx of GI bleed  Device: - St Jude single lead -Therapies: on at 200 bpm  Adverse Events on VAD: 05/31/16 - Controller power-up associated with pump start event. Controller change out performed.  06/01/16 - Controller pins lubricated per [employer] reps  06/17/16 - Controller power-up associated with pump start event while changing power source 06/22/16 - Controller pins re-lubricated per [employer] reps   Plan/Recommendations:  1. Will give 3 units FFP to reverse INR. Goal INR 1.5 for 48 hours. 2. Repeat head CT 6 hours after initial CT. 3. Speed decreased to 2660. Backup controller programmed. 4. Please call VAD pager with any VAD equipment or drive line issues  Alyce PaganAllison Haggard RN, VAD Coordinator 24/7 VAD Pager: [phone]

## 2017-07-31 NOTE — Consult Note (Signed)
Reason for Consult:post traumatic SAH, cerebral contusions Referring Physician: [place], [name] is an 62 y.o. male.  HPI: whom while in his home this morning, tripped over his feet and fell striking his head. He did not lose consciousness. Noticed bleeding from his head, and that his right eye was swelling. He also fell this past weekend, on Sunday and bruised his upper extremities then. No emesis, no seizure activity.  Past Medical History:  Diagnosis Date  . AICD (automatic cardioverter/defibrillator) present   . Arthritis   . Barretts syndrome   . Carotid artery disease ([place])    a. Dopp 09/2011: 16-10% RICA, 96-04% LICA.;  b.  Carotid US (7/14):  Bilateral 60-79% => f/u 6 mos  . Cerebrovascular disease   . Chronic systolic heart failure ([place])   . Coronary artery disease    a. AWMI requiring IABP 2005 s/p Horizon study stent-LAD, staged BMS-Cx, DESx2-RCA. b. Last Last LHC (6/06):  EF 25%, pLAD stent 40-50, after stent 40, dLAD 20, pCFX 20, pOM1 70, pRCA 20, mRCA stents ok.=> med Rx;  c.  Myoview (7/14):  EF 26%, large ant, septal, inf and apical infarct, no ischemia.  Med Rx continued  . Diabetes mellitus   . Dyspnea    with exertion  . Headache   . Heart murmur   . History of kidney stones   . HTN (hypertension)   . Hyperlipidemia    mixed  . ICD (implantable cardiac defibrillator), dual, in situ    St. Jude for severe LVD EF 25% 2/07 explanted 2010. Medtronic Virtuoso II DR Dual-chamber cardiverter-defibrillator  with pocket revision, Dr. Caryl Comes  . Ischemic cardiomyopathy    a. Echo (09/27/12): EF 20%, diffuse HK with periapical AK, no LV thrombus noted, restrictive physiology, trivial MR, mild LAE, RVSF mildly reduced, PASP 39  . Pleural effusion 01/05/2016  . Presence of permanent cardiac pacemaker   . PVD (peripheral vascular disease) ([facility])    a. ABI 09/2011 - R normal, L moderate - saw Dr. Fletcher Anon - med rx.   Marland Kitchen RIATA ICD Lead--on advisory recall    . Tobacco abuse      Past Surgical History:  Procedure Laterality Date  . ABDOMINAL AORTAGRAM N/A 11/06/2013   Procedure: ABDOMINAL Maxcine Ham;  Surgeon: Angelia Mould, MD;  Location: [facility] CATH LAB;  Service: Cardiovascular;  Laterality: N/A;  . AMPUTATION TOE Right 06/08/2017   Procedure: AMPUTATION TOE;  Surgeon: Edrick Kins, DPM;  Location: [place];  Service: Podiatry;  Laterality: Right;  . APPENDECTOMY  2000   ruptured  . CARDIAC CATHETERIZATION N/A 01/07/2016   Procedure: Right/Left Heart Cath and Coronary Angiography;  Surgeon: Larey Dresser, MD;  Location: [place] CV LAB;  Service: Cardiovascular;  Laterality: N/A;  . CARDIAC CATHETERIZATION N/A 01/28/2016   Procedure: Coronary Stent Intervention;  Surgeon: Sherren Mocha, MD;  Location: [place] CV LAB;  Service: Cardiovascular;  Laterality: N/A;  . CARDIAC CATHETERIZATION N/A 01/28/2016   Procedure: Right Heart Cath;  Surgeon: Sherren Mocha, MD;  Location: [place] CV LAB;  Service: Cardiovascular;  Laterality: N/A;  . CARDIAC CATHETERIZATION N/A 01/28/2016   Procedure: Intravascular Ultrasound/IVUS;  Surgeon: Sherren Mocha, MD;  Location: [place] CV LAB;  Service: Cardiovascular;  Laterality: N/A;  . CHEST TUBE INSERTION Right 04/07/2016   Procedure: INSERTION PLEURAL DRAINAGE CATHETER;  Surgeon: Ivin Poot, MD;  Location: [place];  Service: Thoracic;  Laterality: Right;  . ENTEROSCOPY N/A 07/10/2016   Procedure: ENTEROSCOPY;  Surgeon: Laurence Spates, MD;  Location: [facility] ENDOSCOPY;  Service: Endoscopy;  Laterality: N/A;  . FEMORAL-POPLITEAL BYPASS GRAFT Right 11/07/2013   Procedure: Right FEMORAL-POPLITEAL ARTERY Bypass ;  Surgeon: Angelia Mould, MD;  Location: [place];  Service: Vascular;  Laterality: Right;  . FEMORAL-POPLITEAL BYPASS GRAFT Left 11/17/2014   Procedure: BYPASS GRAFT FEMORAL-POPLITEAL ARTERY USING GORE PROPATEN 6MM X 80CM VASCULAR GRAFT;  Surgeon: Angelia Mould, MD;  Location: [place];  Service: Vascular;   Laterality: Left;  . HOT HEMOSTASIS N/A 07/10/2016   Procedure: HOT HEMOSTASIS (ARGON PLASMA COAGULATION/BICAP);  Surgeon: Laurence Spates, MD;  Location: [facility] ENDOSCOPY;  Service: Endoscopy;  Laterality: N/A;  . IMPLANTABLE CARDIOVERTER DEFIBRILLATOR (ICD) GENERATOR CHANGE N/A 12/16/2012   Procedure: ICD GENERATOR CHANGE;  Surgeon: Deboraha Sprang, MD;  Location: [facility] CATH LAB;  Service: Cardiovascular;  Laterality: N/A;  . INSERTION OF IMPLANTABLE LEFT VENTRICULAR ASSIST DEVICE N/A 05/30/2016   Procedure: INSERTION OF IMPLANTABLE LEFT VENTRICULAR ASSIST DEVICE;  Surgeon: Ivin Poot, MD;  Location: [place];  Service: Open Heart Surgery;  Laterality: N/A;  CIRC ARREST  NITRIC OXIDE  . INTRAOPERATIVE ARTERIOGRAM Right 11/07/2013   Procedure: INTRA OPERATIVE ARTERIOGRAM;  Surgeon: Angelia Mould, MD;  Location: [place];  Service: Vascular;  Laterality: Right;  . LOWER EXTREMITY ANGIOGRAM Bilateral 11/06/2013   Procedure: LOWER EXTREMITY ANGIOGRAM;  Surgeon: Angelia Mould, MD;  Location: [facility] CATH LAB;  Service: Cardiovascular;  Laterality: Bilateral;  . Medtronic Virtuoso II DR dual-chamber cardioverter-defibrillation with pocket revison     Dr. Virl Axe  . MULTIPLE EXTRACTIONS WITH ALVEOLOPLASTY N/A 04/26/2016   Procedure: Extraction of tooth #'s 5-14 and 20-30 with alveoloplasty;  Surgeon: Lenn Cal, DDS;  Location: [place];  Service: Oral Surgery;  Laterality: N/A;  . PERIPHERAL VASCULAR CATHETERIZATION N/A 09/25/2014   Procedure: Abdominal Aortogram;  Surgeon: Elam Dutch, MD;  Location: [place] CV LAB;  Service: Cardiovascular;  Laterality: N/A;  . REMOVAL OF PLEURAL DRAINAGE CATHETER Right 06/13/2016   Procedure: REMOVAL OF PLEURAL DRAINAGE CATHETER;  Surgeon: Ivin Poot, MD;  Location: [place];  Service: Thoracic;  Laterality: Right;  . RIGHT HEART CATH N/A 05/01/2016   Procedure: Right Heart Cath;  Surgeon: Larey Dresser, MD;  Location: [place] CV LAB;   Service: Cardiovascular;  Laterality: N/A;  . RIGHT HEART CATH N/A 05/25/2016   Procedure: Right Heart Cath;  Surgeon: Larey Dresser, MD;  Location: [place] CV LAB;  Service: Cardiovascular;  Laterality: N/A;  . TEE WITHOUT CARDIOVERSION N/A 05/30/2016   Procedure: TRANSESOPHAGEAL ECHOCARDIOGRAM (TEE);  Surgeon: Prescott Gum, Collier Salina, MD;  Location: [place];  Service: Open Heart Surgery;  Laterality: N/A;  . TEE WITHOUT CARDIOVERSION N/A 06/11/2017   Procedure: TRANSESOPHAGEAL ECHOCARDIOGRAM (TEE);  Surgeon: Larey Dresser, MD;  Location: [facility] ENDOSCOPY;  Service: Cardiovascular;  Laterality: N/A;  . US GUIDED THORACENTESIS RIGHT ([place] HX) Right 01/05/2016    Family History  Problem Relation Age of Onset  . Diabetes Mother   . Coronary artery disease Father   . Heart disease Father        before age 25    Social History:  reports that he has been smoking cigarettes.  He has a 22.50 pack-year smoking history. He has never used smokeless tobacco. He reports that he does not drink alcohol or use drugs.  Allergies:  Allergies  Allergen Reactions  . Metformin And Related Nausea And Vomiting    *Only the extended release* (does this)  .  Niacin And Related Other (See Comments)    REACTION IS SIDE EFFECT "SEVERE" FLUSHING    Medications: I have reviewed the patient's current medications.  Results for orders placed or performed during the hospital encounter of 07/12/2017 (from the past 48 hour(s))  Protime-INR     Status: Abnormal   Collection Time: 07/26/2017 10:20 AM  Result Value Ref Range   Prothrombin Time 38.7 (H) 11.4 - 15.2 seconds   INR 4.00     Comment: Performed at [facility], [address], [place], [place] [address]  CBC with Differential     Status: Abnormal   Collection Time: 07/25/2017 10:20 AM  Result Value Ref Range   WBC 6.1 4.0 - 10.5 K/uL   RBC 3.87 (L) 4.22 - 5.81 MIL/uL   Hemoglobin 10.2 (L) 13.0 - 17.0 g/dL   HCT 32.3 (L) 39.0 - 52.0 %   MCV 83.5 78.0 - 100.0 fL    MCH 26.4 26.0 - 34.0 pg   MCHC 31.6 30.0 - 36.0 g/dL   RDW 16.2 (H) 11.5 - 15.5 %   Platelets 144 (L) 150 - 400 K/uL   Neutrophils Relative % 80 %   Lymphocytes Relative 8 %   Monocytes Relative 9 %   Eosinophils Relative 3 %   Basophils Relative 0 %   Neutro Abs 4.9 1.7 - 7.7 K/uL   Lymphs Abs 0.5 (L) 0.7 - 4.0 K/uL   Monocytes Absolute 0.5 0.1 - 1.0 K/uL   Eosinophils Absolute 0.2 0.0 - 0.7 K/uL   Basophils Absolute 0.0 0.0 - 0.1 K/uL   RBC Morphology Schistocytes present    WBC Morphology MILD LEFT SHIFT (1-5% METAS, OCC MYELO, OCC BANDS)     Comment: TOXIC GRANULATION   Smear Review PLATELET COUNT CONFIRMED BY SMEAR     Comment: GIANT PLATELETS SEEN Performed at [facility], [address], [place], [place] [address]   Basic metabolic panel     Status: Abnormal   Collection Time: 07/04/2017 10:20 AM  Result Value Ref Range   Sodium 132 (L) 135 - 145 mmol/L   Potassium 3.7 3.5 - 5.1 mmol/L   Chloride 99 98 - 111 mmol/L   CO2 22 22 - 32 mmol/L   Glucose, Bld 135 (H) 70 - 99 mg/dL   BUN 25 (H) 8 - 23 mg/dL   Creatinine, Ser 1.74 (H) 0.61 - 1.24 mg/dL   Calcium 8.4 (L) 8.9 - 10.3 mg/dL   GFR calc non Af Amer 40 (L) >60 mL/min   GFR calc Af Amer 47 (L) >60 mL/min    Comment: (NOTE) The eGFR has been calculated using the CKD EPI equation. This calculation has not been validated in all clinical situations. eGFR's persistently <60 mL/min signify possible Chronic Kidney Disease.    Anion gap 11 5 - 15    Comment: Performed at [facility], [address], [place], [place] [address]  CBG monitoring, ED     Status: Abnormal   Collection Time: 07/06/2017  1:53 PM  Result Value Ref Range   Glucose-Capillary 179 (H) 70 - 99 mg/dL  Protime-INR     Status: Abnormal   Collection Time: 07/04/2017  3:14 PM  Result Value Ref Range   Prothrombin Time 38.4 (H) 11.4 - 15.2 seconds   INR 3.97     Comment: Performed at [facility], [place] [address], [place], [place] [address]  Prepare  fresh frozen plasma     Status: None (Preliminary result)  Collection Time: 07/15/2017  3:21 PM  Result Value Ref Range   Unit Number [id number]    Blood Component Type THAWED PLASMA    Unit division 00    Status of Unit ISSUED    Unit tag comment VERBAL ORDERS PER DR BENSOMON    Transfusion Status      OK TO TRANSFUSE Performed at [facility], [place] [address], [place], [place] [address]    Unit Number [id number]    Blood Component Type THAWED PLASMA    Unit division 00    Status of Unit ISSUED    Unit tag comment VERBAL ORDERS PER DR BENSOMON    Transfusion Status OK TO TRANSFUSE   Type and screen [place]     Status: None   Collection Time: 07/08/2017  3:24 PM  Result Value Ref Range   ABO/RH(D) A POS    Antibody Screen NEG    Sample Expiration      08/03/2017 Performed at [facility], [place] [address], [place], [place] [address]   Lactate dehydrogenase     Status: None   Collection Time: 07/23/2017  3:53 PM  Result Value Ref Range   LDH 161 98 - 192 U/L    Comment: Performed at [facility], [place] [address] [place], [place] [address]  Prepare fresh frozen plasma     Status: None (Preliminary result)   Collection Time: 07/27/2017  3:58 PM  Result Value Ref Range   Unit Number [id number]    Blood Component Type THAWED PLASMA    Unit division 00    Status of Unit ALLOCATED    Transfusion Status OK TO TRANSFUSE     Ct Head Wo Contrast  Result Date: 07/30/2017 CLINICAL DATA:  Tripped and fell this morning, bruising above RIGHT eye, also fell a few weeks ago, on Coumadin, history chronic systolic heart failure, coronary artery disease, diabetes mellitus, hypertension, ischemic cardiomyopathy, smoker EXAM: CT HEAD WITHOUT CONTRAST CT MAXILLOFACIAL WITHOUT CONTRAST CT CERVICAL SPINE WITHOUT CONTRAST TECHNIQUE: Multidetector CT imaging of the head, cervical spine, and maxillofacial structures were performed using the standard protocol  without intravenous contrast. Multiplanar CT image reconstructions of the cervical spine and maxillofacial structures were also generated. Right side of face marked with BB. COMPARISON:  CT head 04/11/2017, 10/28/2010 FINDINGS: CT HEAD FINDINGS Brain: Generalized atrophy. Normal ventricular morphology. No midline shift. Subarachnoid blood identified in the anterior interhemispheric fissure and at multiple sulci in the anterior frontal lobes bilaterally. Additional small amount of subarachnoid blood at the anterior aspect of the RIGHT middle cranial fossa. Small hemorrhagic contusion of the LEFT frontal lobe anteriorly. No evidence of acute infarction or mass lesion. Vascular: Atherosclerotic calcifications of the internal carotid arteries bilaterally at skull base Skull: Calvaria intact Other: N/A CT MAXILLOFACIAL FINDINGS Osseous: Nasal septum midline. Nasal bones and orbits intact. TMJ alignment normal bilaterally. No facial bone fractures identified. Small lucent focus within the lateral wall of the RIGHT orbit minimally increased since 2012. Orbits: Intraorbital soft tissue planes clear. Significant RIGHT periorbital contusion/hematoma. Sinuses: Small air-fluid level LEFT sphenoid sinus. Small amount of fluid within a few inferior LEFT mastoid air cells. Remaining paranasal sinuses and mastoid air cells clear. Middle ear cavities clear bilaterally. Partial opacification of LEFT external auditory canal. Soft tissues: RIGHT periorbital contusion/hematoma. Remaining facial soft tissues unremarkable. Atherosclerotic calcifications of the carotid bifurcations bilaterally. CT CERVICAL SPINE FINDINGS Alignment: Normal Skull base and vertebrae: Vertebral body heights maintained. Disc space narrowing  C4-C5 and C5-C6 with endplate spur formation. Visualized skull base intact. No fracture, subluxation or bone destruction. Scattered facet degenerative changes. Soft tissues and spinal canal: Prevertebral soft tissues normal  thickness. Atherosclerotic calcifications at the carotid bifurcations. Remaining visualized soft tissues unremarkable. Disc levels:  Minimally bulging disc at C4-C5. Upper chest: Lung apices clear Other: LEFT side pacemaker leads noted. IMPRESSION: Subarachnoid hemorrhage at the anterior interhemispheric fissure, at sulci in the anterior frontal lobes bilaterally, and at the anterior aspect of the RIGHT middle cranial fossa. Small hemorrhagic contusion of the LEFT frontal lobe. No definite acute facial bone abnormalities. Small amount of nonspecific fluid in the sphenoid sinus and within the LEFT mastoid air cells. Degenerative disc and facet disease changes of the cervical spine. No acute cervical spine abnormalities. Critical Value/emergent results were called by telephone at the time of interpretation on 07/11/2017 at 11:46 am to Dr. Carmin Muskrat , who verbally acknowledged these results. Electronically Signed   By: Lavonia Dana M.D.   On: 07/13/2017 11:49   Ct Cervical Spine Wo Contrast  Result Date: 07/05/2017 CLINICAL DATA:  Tripped and fell this morning, bruising above RIGHT eye, also fell a few weeks ago, on Coumadin, history chronic systolic heart failure, coronary artery disease, diabetes mellitus, hypertension, ischemic cardiomyopathy, smoker EXAM: CT HEAD WITHOUT CONTRAST CT MAXILLOFACIAL WITHOUT CONTRAST CT CERVICAL SPINE WITHOUT CONTRAST TECHNIQUE: Multidetector CT imaging of the head, cervical spine, and maxillofacial structures were performed using the standard protocol without intravenous contrast. Multiplanar CT image reconstructions of the cervical spine and maxillofacial structures were also generated. Right side of face marked with BB. COMPARISON:  CT head 04/11/2017, 10/28/2010 FINDINGS: CT HEAD FINDINGS Brain: Generalized atrophy. Normal ventricular morphology. No midline shift. Subarachnoid blood identified in the anterior interhemispheric fissure and at multiple sulci in the anterior  frontal lobes bilaterally. Additional small amount of subarachnoid blood at the anterior aspect of the RIGHT middle cranial fossa. Small hemorrhagic contusion of the LEFT frontal lobe anteriorly. No evidence of acute infarction or mass lesion. Vascular: Atherosclerotic calcifications of the internal carotid arteries bilaterally at skull base Skull: Calvaria intact Other: N/A CT MAXILLOFACIAL FINDINGS Osseous: Nasal septum midline. Nasal bones and orbits intact. TMJ alignment normal bilaterally. No facial bone fractures identified. Small lucent focus within the lateral wall of the RIGHT orbit minimally increased since 2012. Orbits: Intraorbital soft tissue planes clear. Significant RIGHT periorbital contusion/hematoma. Sinuses: Small air-fluid level LEFT sphenoid sinus. Small amount of fluid within a few inferior LEFT mastoid air cells. Remaining paranasal sinuses and mastoid air cells clear. Middle ear cavities clear bilaterally. Partial opacification of LEFT external auditory canal. Soft tissues: RIGHT periorbital contusion/hematoma. Remaining facial soft tissues unremarkable. Atherosclerotic calcifications of the carotid bifurcations bilaterally. CT CERVICAL SPINE FINDINGS Alignment: Normal Skull base and vertebrae: Vertebral body heights maintained. Disc space narrowing C4-C5 and C5-C6 with endplate spur formation. Visualized skull base intact. No fracture, subluxation or bone destruction. Scattered facet degenerative changes. Soft tissues and spinal canal: Prevertebral soft tissues normal thickness. Atherosclerotic calcifications at the carotid bifurcations. Remaining visualized soft tissues unremarkable. Disc levels:  Minimally bulging disc at C4-C5. Upper chest: Lung apices clear Other: LEFT side pacemaker leads noted. IMPRESSION: Subarachnoid hemorrhage at the anterior interhemispheric fissure, at sulci in the anterior frontal lobes bilaterally, and at the anterior aspect of the RIGHT middle cranial fossa.  Small hemorrhagic contusion of the LEFT frontal lobe. No definite acute facial bone abnormalities. Small amount of nonspecific fluid in the sphenoid sinus and within the LEFT mastoid  air cells. Degenerative disc and facet disease changes of the cervical spine. No acute cervical spine abnormalities. Critical Value/emergent results were called by telephone at the time of interpretation on 07/12/2017 at 11:46 am to Dr. Carmin Muskrat , who verbally acknowledged these results. Electronically Signed   By: Lavonia Dana M.D.   On: 07/15/2017 11:49   Ct Maxillofacial Wo Contrast  Result Date: 07/21/2017 CLINICAL DATA:  Tripped and fell this morning, bruising above RIGHT eye, also fell a few weeks ago, on Coumadin, history chronic systolic heart failure, coronary artery disease, diabetes mellitus, hypertension, ischemic cardiomyopathy, smoker EXAM: CT HEAD WITHOUT CONTRAST CT MAXILLOFACIAL WITHOUT CONTRAST CT CERVICAL SPINE WITHOUT CONTRAST TECHNIQUE: Multidetector CT imaging of the head, cervical spine, and maxillofacial structures were performed using the standard protocol without intravenous contrast. Multiplanar CT image reconstructions of the cervical spine and maxillofacial structures were also generated. Right side of face marked with BB. COMPARISON:  CT head 04/11/2017, 10/28/2010 FINDINGS: CT HEAD FINDINGS Brain: Generalized atrophy. Normal ventricular morphology. No midline shift. Subarachnoid blood identified in the anterior interhemispheric fissure and at multiple sulci in the anterior frontal lobes bilaterally. Additional small amount of subarachnoid blood at the anterior aspect of the RIGHT middle cranial fossa. Small hemorrhagic contusion of the LEFT frontal lobe anteriorly. No evidence of acute infarction or mass lesion. Vascular: Atherosclerotic calcifications of the internal carotid arteries bilaterally at skull base Skull: Calvaria intact Other: N/A CT MAXILLOFACIAL FINDINGS Osseous: Nasal septum  midline. Nasal bones and orbits intact. TMJ alignment normal bilaterally. No facial bone fractures identified. Small lucent focus within the lateral wall of the RIGHT orbit minimally increased since 2012. Orbits: Intraorbital soft tissue planes clear. Significant RIGHT periorbital contusion/hematoma. Sinuses: Small air-fluid level LEFT sphenoid sinus. Small amount of fluid within a few inferior LEFT mastoid air cells. Remaining paranasal sinuses and mastoid air cells clear. Middle ear cavities clear bilaterally. Partial opacification of LEFT external auditory canal. Soft tissues: RIGHT periorbital contusion/hematoma. Remaining facial soft tissues unremarkable. Atherosclerotic calcifications of the carotid bifurcations bilaterally. CT CERVICAL SPINE FINDINGS Alignment: Normal Skull base and vertebrae: Vertebral body heights maintained. Disc space narrowing C4-C5 and C5-C6 with endplate spur formation. Visualized skull base intact. No fracture, subluxation or bone destruction. Scattered facet degenerative changes. Soft tissues and spinal canal: Prevertebral soft tissues normal thickness. Atherosclerotic calcifications at the carotid bifurcations. Remaining visualized soft tissues unremarkable. Disc levels:  Minimally bulging disc at C4-C5. Upper chest: Lung apices clear Other: LEFT side pacemaker leads noted. IMPRESSION: Subarachnoid hemorrhage at the anterior interhemispheric fissure, at sulci in the anterior frontal lobes bilaterally, and at the anterior aspect of the RIGHT middle cranial fossa. Small hemorrhagic contusion of the LEFT frontal lobe. No definite acute facial bone abnormalities. Small amount of nonspecific fluid in the sphenoid sinus and within the LEFT mastoid air cells. Degenerative disc and facet disease changes of the cervical spine. No acute cervical spine abnormalities. Critical Value/emergent results were called by telephone at the time of interpretation on 07/13/2017 at 11:46 am to Dr. Carmin Muskrat , who verbally acknowledged these results. Electronically Signed   By: Lavonia Dana M.D.   On: 07/26/2017 11:49    Review of Systems  Constitutional: Negative.   HENT: Negative.   Eyes: Negative.   Cardiovascular:       CAD, Congestive heart failure,   Skin: Negative.   Endo/Heme/Allergies:       Diabetes   Blood pressure (!) 80/69, pulse (!) 38, temperature 98.7 F (37.1 C), temperature source  Oral, resp. rate 19, height 6' (1.829 m), weight 105.7 kg (233 lb), SpO2 100 %. Physical Exam  Constitutional: He appears well-developed and well-nourished. No distress.  HENT:  Right Ear: External ear normal.  Left Ear: External ear normal.  Nose: Nose normal.  Mouth/Throat: Oropharynx is clear and moist.  Right periorbital hematoma, ecchymosis  Eyes: Pupils are equal, round, and reactive to light. Conjunctivae and EOM are normal.  Neck: Normal range of motion. Neck supple.  Respiratory: Effort normal.  GI: Soft. Bowel sounds are normal.  Neurological: He is alert. He has normal reflexes. He is disoriented. He displays normal reflexes. No cranial nerve deficit or sensory deficit. He exhibits normal muscle tone. Coordination normal. GCS eye subscore is 4. GCS verbal subscore is 5. GCS motor subscore is 6. He displays no Babinski's sign on the right side. He displays no Babinski's sign on the left side.  Reflex Scores:      Tricep reflexes are 2+ on the right side and 2+ on the left side.      Bicep reflexes are 2+ on the right side and 2+ on the left side.      Brachioradialis reflexes are 2+ on the right side and 2+ on the left side.      Patellar reflexes are 2+ on the right side and 2+ on the left side.      Achilles reflexes are 2+ on the right side and 2+ on the left side. Gait not assessed Alert and oriented x 4, follows all commands Moving all extremities well Proprioception is normal  Skin: Skin is warm and dry.  Psychiatric: He has a normal mood and affect. His behavior  is normal. Judgment and thought content normal.    Assessment/Plan: [name] is a 62 y.o. male With intracerebral contusions, SAH. Currently he is neurologically normal. He will be gently reversed to an INR ~1.5 for 48 hours. New head CT this afternoon shows enlarging left frontal ICH. Will watch exam carefully, I should be notified if his exam changes. Thank you.   Tailor Westfall L 07/12/2017, 5:12 PM

## 2017-07-31 NOTE — Progress Notes (Signed)
Patient ID: [name], male   DOB: 1955-08-06, 62 y.o.   MRN: [id number] BP 107/72   Pulse (!) 49   Temp 98.4 F (36.9 C) (Oral)   Resp (!) 24   Ht 6' (1.829 m)   Wt 105.7 kg (233 lb)   SpO2 95%   BMI 31.60 kg/m  Called secondary to possible change in exam.  He is alert, following all commands No drift Moving all extremities  Head ct was worse, and expect further expansion of the contusions. However surgery, or a monitor is not realistic given his anticoagulation status. Will watch closely.

## 2017-07-31 NOTE — ED Notes (Signed)
Worsening of bruising above RT eye noted. EDP aware.

## 2017-07-31 NOTE — ED Notes (Signed)
Pt tripped and fell this morning. Pt has a new skin tear on LLE. Bleeding controlled. Pt noted to have old skin tears on bilateral upper extremities. States this was from a fall few weeks ago. Pt also has bruising above RT eye. Pt is on Coumadin and has LVAD. AOx4.

## 2017-07-31 NOTE — ED Notes (Signed)
Patient transported to CT 

## 2017-07-31 NOTE — ED Notes (Signed)
Pt father request we call him if pt were to be transported out of ED.   [name], Father  [phone]

## 2017-07-31 NOTE — Progress Notes (Signed)
  Spoke with Bed Placement to [facility]. Orders placed for admission.   Re: supratherapeutic INR, am awaiting discussion with Neurosurgery. Full note to follow.    Casimiro NeedleMichael [address]Andy" [place], PA-C 2017/03/04 12:19 PM

## 2017-07-31 NOTE — ED Notes (Signed)
EDP at bedside updating patient. 

## 2017-07-31 NOTE — Progress Notes (Addendum)
Patient ID: [name], male   DOB: 11-04-1955, 62 y.o.   MRN: [id number]  Patient is currently getting his 4th unit FFP.  He has had Kcentra.  He has a headache and vomited once earlier.  No focal neurological deficits and fully oriented/able to answer questions currently.  MAP around 80.   Dr. Franky Machoabbell following.  Will watch closely for changes in exam.   [name] 04-03-17 8:35 PM]

## 2017-07-31 NOTE — ED Notes (Signed)
Suture cart and lidocaine with epi at bedside.

## 2017-07-31 NOTE — ED Provider Notes (Signed)
[facility] EMERGENCY DEPARTMENT Provider Note   CSN: [id number] Arrival date & time: 07/13/2017  8563     History   Chief Complaint Chief Complaint  Patient presents with  . Laceration    HPI [name] is a 62 y.o. male.  HPI  He presents after mechanical fall. Patient has multiple medical issues including chronic anticoagulation, with history of LVAD. He recalls slipping, striking his shin, left, against solid object, and soon thereafter noticed substantial bleeding from the site. Bleeding was somewhat controlled with pressure application. He also recalls hitting his head, shoulders, neck, does not seem to have had loss of consciousness, denies any new weakness, acknowledges global weakness, chronically. He has soreness throughout the back, head, neck.  No medication taken for pain relief thus far.  Past Medical History:  Diagnosis Date  . AICD (automatic cardioverter/defibrillator) present   . Arthritis   . Barretts syndrome   . Carotid artery disease ([place])    a. Dopp 09/2011: 14-97% RICA, 02-63% LICA.;  b.  Carotid US (7/14):  Bilateral 60-79% => f/u 6 mos  . Cerebrovascular disease   . Chronic systolic heart failure ([name])   . Coronary artery disease    a. AWMI requiring IABP 2005 s/p Horizon study stent-LAD, staged BMS-Cx, DESx2-RCA. b. Last Last LHC (6/06):  EF 25%, pLAD stent 40-50, after stent 40, dLAD 20, pCFX 20, pOM1 70, pRCA 20, mRCA stents ok.=> med Rx;  c.  Myoview (7/14):  EF 26%, large ant, septal, inf and apical infarct, no ischemia.  Med Rx continued  . Diabetes mellitus   . Dyspnea    with exertion  . Headache   . Heart murmur   . History of kidney stones   . HTN (hypertension)   . Hyperlipidemia    mixed  . ICD (implantable cardiac defibrillator), dual, in situ    St. Jude for severe LVD EF 25% 2/07 explanted 2010. Medtronic Virtuoso II DR Dual-chamber cardiverter-defibrillator  with pocket revision, Dr. Caryl Comes  . Ischemic cardiomyopathy    a.  Echo (09/27/12): EF 20%, diffuse HK with periapical AK, no LV thrombus noted, restrictive physiology, trivial MR, mild LAE, RVSF mildly reduced, PASP 39  . Pleural effusion 01/05/2016  . Presence of permanent cardiac pacemaker   . PVD (peripheral vascular disease) (H[facility])    a. ABI 09/2011 - R normal, L moderate - saw Dr. Fletcher Anon - med rx.   Marland Kitchen RIATA ICD Lead--on advisory recall    . Tobacco abuse     Patient Active Problem List   Diagnosis Date Noted  . PICC (peripherally inserted central catheter) in place 06/22/2017  . MSSA bacteremia 06/11/2017  . Osteomyelitis of toe ([place]) 06/11/2017  . Septic shock ([place]) 06/06/2017  . Syncope and collapse 04/11/2017  . Fall 04/11/2017  . Low back pain 04/11/2017  . PVC's (premature ventricular contractions)   . Acute upper GI bleed 07/15/2016  . Symptomatic anemia 07/07/2016  . LVAD (left ventricular assist device) present ([place]) 06/16/2016  . Acute on chronic systolic (congestive) heart failure ([place]) 05/24/2016  . Low output heart failure ([place]) 04/05/2016  . Ischemic cardiomyopathy   . Pleural effusion on right   . Diabetes mellitus with complication ([place])   . Hypoglycemia 03/13/2015  . Chronic systolic CHF (congestive heart failure) ([place]) 03/13/2015  . Pressure ulcer 12/31/2014  . Pleural effusion 12/30/2014  . Hyperglycemia without ketosis   . Infected open wound 09/22/2014  . Type 2 diabetes mellitus ([place]) 09/22/2014  . Skin  ulcer ([place]) 09/21/2014  . PAD (peripheral artery disease) ([place]) 11/07/2013  . PVD (peripheral vascular disease) ([place])   . Pre-operative cardiovascular examination   . Metabolic alkalosis 07/13/1973  . Hypotension 10/01/2012  . Carotid stenosis 10/25/2011  . Hypertension 08/09/2010  . Automatic implantable cardioverter-defibrillator in St. Jude 08/09/2010  . Coronary atherosclerosis 01/21/2009  . TOBACCO ABUSE 05/15/2008  . Cerebrovascular disease 05/15/2008  . Peripheral vascular disease ([place]) 05/15/2008  .  Hyperlipidemia 01/22/2008  . SYSTOLIC HEART FAILURE, CHRONIC 01/22/2008    Past Surgical History:  Procedure Laterality Date  . ABDOMINAL AORTAGRAM N/A 11/06/2013   Procedure: ABDOMINAL Maxcine Ham;  Surgeon: Angelia Mould, MD;  Location: [facility] CATH LAB;  Service: Cardiovascular;  Laterality: N/A;  . AMPUTATION TOE Right 06/08/2017   Procedure: AMPUTATION TOE;  Surgeon: Edrick Kins, DPM;  Location: [place];  Service: Podiatry;  Laterality: Right;  . APPENDECTOMY  2000   ruptured  . CARDIAC CATHETERIZATION N/A 01/07/2016   Procedure: Right/Left Heart Cath and Coronary Angiography;  Surgeon: Larey Dresser, MD;  Location: [place] CV LAB;  Service: Cardiovascular;  Laterality: N/A;  . CARDIAC CATHETERIZATION N/A 01/28/2016   Procedure: Coronary Stent Intervention;  Surgeon: Sherren Mocha, MD;  Location: [place] CV LAB;  Service: Cardiovascular;  Laterality: N/A;  . CARDIAC CATHETERIZATION N/A 01/28/2016   Procedure: Right Heart Cath;  Surgeon: Sherren Mocha, MD;  Location: [place] CV LAB;  Service: Cardiovascular;  Laterality: N/A;  . CARDIAC CATHETERIZATION N/A 01/28/2016   Procedure: Intravascular Ultrasound/IVUS;  Surgeon: Sherren Mocha, MD;  Location: [place] CV LAB;  Service: Cardiovascular;  Laterality: N/A;  . CHEST TUBE INSERTION Right 04/07/2016   Procedure: INSERTION PLEURAL DRAINAGE CATHETER;  Surgeon: Ivin Poot, MD;  Location: [place];  Service: Thoracic;  Laterality: Right;  . ENTEROSCOPY N/A 07/10/2016   Procedure: ENTEROSCOPY;  Surgeon: Laurence Spates, MD;  Location: [facility] ENDOSCOPY;  Service: Endoscopy;  Laterality: N/A;  . FEMORAL-POPLITEAL BYPASS GRAFT Right 11/07/2013   Procedure: Right FEMORAL-POPLITEAL ARTERY Bypass ;  Surgeon: Angelia Mould, MD;  Location: [place];  Service: Vascular;  Laterality: Right;  . FEMORAL-POPLITEAL BYPASS GRAFT Left 11/17/2014   Procedure: BYPASS GRAFT FEMORAL-POPLITEAL ARTERY USING GORE PROPATEN 6MM X 80CM VASCULAR GRAFT;   Surgeon: Angelia Mould, MD;  Location: [place];  Service: Vascular;  Laterality: Left;  . HOT HEMOSTASIS N/A 07/10/2016   Procedure: HOT HEMOSTASIS (ARGON PLASMA COAGULATION/BICAP);  Surgeon: Laurence Spates, MD;  Location: [facility] ENDOSCOPY;  Service: Endoscopy;  Laterality: N/A;  . IMPLANTABLE CARDIOVERTER DEFIBRILLATOR (ICD) GENERATOR CHANGE N/A 12/16/2012   Procedure: ICD GENERATOR CHANGE;  Surgeon: Deboraha Sprang, MD;  Location: [facility] CATH LAB;  Service: Cardiovascular;  Laterality: N/A;  . INSERTION OF IMPLANTABLE LEFT VENTRICULAR ASSIST DEVICE N/A 05/30/2016   Procedure: INSERTION OF IMPLANTABLE LEFT VENTRICULAR ASSIST DEVICE;  Surgeon: Ivin Poot, MD;  Location: [place];  Service: Open Heart Surgery;  Laterality: N/A;  CIRC ARREST  NITRIC OXIDE  . INTRAOPERATIVE ARTERIOGRAM Right 11/07/2013   Procedure: INTRA OPERATIVE ARTERIOGRAM;  Surgeon: Angelia Mould, MD;  Location: [place];  Service: Vascular;  Laterality: Right;  . LOWER EXTREMITY ANGIOGRAM Bilateral 11/06/2013   Procedure: LOWER EXTREMITY ANGIOGRAM;  Surgeon: Angelia Mould, MD;  Location: [facility] CATH LAB;  Service: Cardiovascular;  Laterality: Bilateral;  . Medtronic Virtuoso II DR dual-chamber cardioverter-defibrillation with pocket revison     Dr. Virl Axe  . MULTIPLE EXTRACTIONS WITH ALVEOLOPLASTY N/A 04/26/2016   Procedure: Extraction of tooth #'s  5-14 and 20-30 with alveoloplasty;  Surgeon: Lenn Cal, DDS;  Location: [place];  Service: Oral Surgery;  Laterality: N/A;  . PERIPHERAL VASCULAR CATHETERIZATION N/A 09/25/2014   Procedure: Abdominal Aortogram;  Surgeon: Elam Dutch, MD;  Location: [place] CV LAB;  Service: Cardiovascular;  Laterality: N/A;  . REMOVAL OF PLEURAL DRAINAGE CATHETER Right 06/13/2016   Procedure: REMOVAL OF PLEURAL DRAINAGE CATHETER;  Surgeon: Ivin Poot, MD;  Location: [place];  Service: Thoracic;  Laterality: Right;  . RIGHT HEART CATH N/A 05/01/2016   Procedure: Right Heart  Cath;  Surgeon: Larey Dresser, MD;  Location: [place] CV LAB;  Service: Cardiovascular;  Laterality: N/A;  . RIGHT HEART CATH N/A 05/25/2016   Procedure: Right Heart Cath;  Surgeon: Larey Dresser, MD;  Location: [place] CV LAB;  Service: Cardiovascular;  Laterality: N/A;  . TEE WITHOUT CARDIOVERSION N/A 05/30/2016   Procedure: TRANSESOPHAGEAL ECHOCARDIOGRAM (TEE);  Surgeon: Prescott Gum, Collier Salina, MD;  Location: [place];  Service: Open Heart Surgery;  Laterality: N/A;  . TEE WITHOUT CARDIOVERSION N/A 06/11/2017   Procedure: TRANSESOPHAGEAL ECHOCARDIOGRAM (TEE);  Surgeon: Larey Dresser, MD;  Location: [facility] ENDOSCOPY;  Service: Cardiovascular;  Laterality: N/A;  . US GUIDED THORACENTESIS RIGHT ([place] HX) Right 01/05/2016        Home Medications    Prior to Admission medications   Medication Sig Start Date End Date Taking? Authorizing Provider  atorvastatin (LIPITOR) 80 MG tablet Take 1 tablet (80 mg total) by mouth daily. 04/17/17   Clegg, Amy D, NP  blood glucose meter kit and supplies Ultra Blue Kit Dispense based on patient and insurance preference. Use up to four times daily as directed. (FOR ICD-9 250.00, 250.01). 08/04/16   Larey Dresser, MD  calcium carbonate (TUMS EX) 750 MG chewable tablet Chew 2 tablets by mouth as needed for heartburn.    [provider]  danazol (DANOCRINE) 100 MG capsule Take 1 capsule (100 mg total) by mouth 2 (two) times daily. 05/18/17   Shirley Friar, PA-C  ezetimibe (ZETIA) 10 MG tablet Take 1 tablet (10 mg total) by mouth daily. 05/04/17   Larey Dresser, MD  Ferrous Sulfate (IRON) 325 (65 Fe) MG TABS Take 1 tablet (325 mg total) by mouth 2 (two) times daily with a meal. 06/12/17   Clegg, Amy D, NP  insulin NPH-regular Human (NOVOLIN 70/30) (70-30) 100 UNIT/ML injection Inject 12 Units into the skin 2 (two) times daily.     [provider]  losartan (COZAAR) 50 MG tablet Take 1 tablet (50 mg total) by mouth 2 (two) times daily.  06/12/17   Clegg, Amy D, NP  pantoprazole (PROTONIX) 40 MG tablet Take 1 tablet (40 mg total) by mouth 2 (two) times daily. 06/13/17   Larey Dresser, MD  potassium chloride SA (K-DUR,KLOR-CON) 20 MEQ tablet Take 1 tablet (20 mEq total) by mouth daily. 06/12/17   Clegg, Amy D, NP  sildenafil (REVATIO) 20 MG tablet Take 1 tablet (20 mg total) by mouth 3 (three) times daily. Patient taking differently: Take 20 mg by mouth 2 (two) times daily.  05/15/17   Larey Dresser, MD  spironolactone (ALDACTONE) 25 MG tablet Take 0.5 tablets (12.5 mg total) by mouth daily. 06/13/17   Clegg, Amy D, NP  sucralfate (CARAFATE) 1 g tablet Take 1 tablet (1 g total) by mouth 4 (four) times daily -  with meals and at bedtime. Patient taking differently: Take 1 g by mouth  2 (two) times daily.  04/16/17   Clegg, Amy D, NP  torsemide (DEMADEX) 20 MG tablet Take 2 tablets (40 mg total) by mouth daily. Patient taking differently: Take 60 mg by mouth daily.  06/13/17   Larey Dresser, MD  traMADol (ULTRAM) 50 MG tablet Take 1 tablet (50 mg total) by mouth every 6 (six) hours as needed for moderate pain. 07/17/17   Larey Dresser, MD  warfarin (COUMADIN) 5 MG tablet Take 1 tablet (5 mg total) by mouth daily at 6 PM. Take 1 tablet (5 mg) daily or as directed by HF Clinic Patient taking differently: Take 5 mg by mouth daily at 6 PM. Take 1 tablet (5 mg) daily except 55m Tue/Thur  or as directed by HF Clinic 07/02/17   MLarey Dresser MD    Family History Family History  Problem Relation Age of Onset  . Diabetes Mother   . Coronary artery disease Father   . Heart disease Father        before age 63   Social History Social History   Tobacco Use  . Smoking status: Current Every Day Smoker    Packs/day: 0.50    Years: 45.00    Pack years: 22.50    Types: Cigarettes  . Smokeless tobacco: Never Used  . Tobacco comment: pt reports he is stress smoker  Substance Use Topics  . Alcohol use: No    Alcohol/week: 0.0 oz    . Drug use: No    Comment: former Cocain, Acid, Marijuana - 25 years ago     Allergies   Metformin and related and Niacin and related   Review of Systems Review of Systems  Constitutional:       Per HPI, otherwise negative  HENT:       Per HPI, otherwise negative  Respiratory:       Per HPI, otherwise negative  Cardiovascular:       Per HPI, otherwise negative  Gastrointestinal: Negative for vomiting.  Endocrine:       Negative aside from HPI  Genitourinary:       Neg aside from HPI   Musculoskeletal:       Per HPI, otherwise negative  Skin: Positive for wound.  Neurological: Negative for syncope.  Hematological: Bruises/bleeds easily.     Physical Exam Updated Vital Signs BP (!) 67/55   Pulse 80   Temp 99.1 F (37.3 C) (Oral)   Resp 18   Ht 6' (1.829 m)   Wt 105.7 kg (233 lb)   SpO2 98%   BMI 31.60 kg/m   Physical Exam  Constitutional: He is oriented to person, place, and time. He has a sickly appearance. No distress.  HENT:  Head: Normocephalic.    Eyes: Conjunctivae and EOM are normal.  Neck:    Cardiovascular:  Mechanical sounds audible heart rate about 80 LVAD device battery pack appreciable  Pulmonary/Chest: Effort normal. No stridor. No respiratory distress.  Abdominal: He exhibits no distension.  Musculoskeletal: He exhibits no edema.  Neurological: He is alert and oriented to person, place, and time.  Skin: Skin is warm and dry.  On each extremity there are areas of ecchymosis, and several areas bandaged, due to skin tears, prior to today's event. The left lower extremity has a large skin avulsion/tear.  Psychiatric: He has a normal mood and affect.  Nursing note and vitals reviewed.    ED Treatments / Results  Labs (all labs ordered are listed, but only  abnormal results are displayed) Labs Reviewed  PROTIME-INR - Abnormal; Notable for the following components:      Result Value   Prothrombin Time 38.7 (*)    All other components  within normal limits  CBC WITH DIFFERENTIAL/PLATELET - Abnormal; Notable for the following components:   RBC 3.87 (*)    Hemoglobin 10.2 (*)    HCT 32.3 (*)    RDW 16.2 (*)    Platelets 144 (*)    Lymphs Abs 0.5 (*)    All other components within normal limits  BASIC METABOLIC PANEL - Abnormal; Notable for the following components:   Sodium 132 (*)    Glucose, Bld 135 (*)    BUN 25 (*)    Creatinine, Ser 1.74 (*)    Calcium 8.4 (*)    GFR calc non Af Amer 40 (*)    GFR calc Af Amer 47 (*)    All other components within normal limits  CBG MONITORING, ED - Abnormal; Notable for the following components:   Glucose-Capillary 179 (*)    All other components within normal limits    Radiology Ct Head Wo Contrast  Result Date: 07/23/2017 CLINICAL DATA:  Tripped and fell this morning, bruising above RIGHT eye, also fell a few weeks ago, on Coumadin, history chronic systolic heart failure, coronary artery disease, diabetes mellitus, hypertension, ischemic cardiomyopathy, smoker EXAM: CT HEAD WITHOUT CONTRAST CT MAXILLOFACIAL WITHOUT CONTRAST CT CERVICAL SPINE WITHOUT CONTRAST TECHNIQUE: Multidetector CT imaging of the head, cervical spine, and maxillofacial structures were performed using the standard protocol without intravenous contrast. Multiplanar CT image reconstructions of the cervical spine and maxillofacial structures were also generated. Right side of face marked with BB. COMPARISON:  CT head 04/11/2017, 10/28/2010 FINDINGS: CT HEAD FINDINGS Brain: Generalized atrophy. Normal ventricular morphology. No midline shift. Subarachnoid blood identified in the anterior interhemispheric fissure and at multiple sulci in the anterior frontal lobes bilaterally. Additional small amount of subarachnoid blood at the anterior aspect of the RIGHT middle cranial fossa. Small hemorrhagic contusion of the LEFT frontal lobe anteriorly. No evidence of acute infarction or mass lesion. Vascular: Atherosclerotic  calcifications of the internal carotid arteries bilaterally at skull base Skull: Calvaria intact Other: N/A CT MAXILLOFACIAL FINDINGS Osseous: Nasal septum midline. Nasal bones and orbits intact. TMJ alignment normal bilaterally. No facial bone fractures identified. Small lucent focus within the lateral wall of the RIGHT orbit minimally increased since 2012. Orbits: Intraorbital soft tissue planes clear. Significant RIGHT periorbital contusion/hematoma. Sinuses: Small air-fluid level LEFT sphenoid sinus. Small amount of fluid within a few inferior LEFT mastoid air cells. Remaining paranasal sinuses and mastoid air cells clear. Middle ear cavities clear bilaterally. Partial opacification of LEFT external auditory canal. Soft tissues: RIGHT periorbital contusion/hematoma. Remaining facial soft tissues unremarkable. Atherosclerotic calcifications of the carotid bifurcations bilaterally. CT CERVICAL SPINE FINDINGS Alignment: Normal Skull base and vertebrae: Vertebral body heights maintained. Disc space narrowing C4-C5 and C5-C6 with endplate spur formation. Visualized skull base intact. No fracture, subluxation or bone destruction. Scattered facet degenerative changes. Soft tissues and spinal canal: Prevertebral soft tissues normal thickness. Atherosclerotic calcifications at the carotid bifurcations. Remaining visualized soft tissues unremarkable. Disc levels:  Minimally bulging disc at C4-C5. Upper chest: Lung apices clear Other: LEFT side pacemaker leads noted. IMPRESSION: Subarachnoid hemorrhage at the anterior interhemispheric fissure, at sulci in the anterior frontal lobes bilaterally, and at the anterior aspect of the RIGHT middle cranial fossa. Small hemorrhagic contusion of the LEFT frontal lobe. No definite acute facial bone abnormalities.  Small amount of nonspecific fluid in the sphenoid sinus and within the LEFT mastoid air cells. Degenerative disc and facet disease changes of the cervical spine. No acute  cervical spine abnormalities. Critical Value/emergent results were called by telephone at the time of interpretation on 07/07/2017 at 11:46 am to Dr. Carmin Muskrat , who verbally acknowledged these results. Electronically Signed   By: Lavonia Dana M.D.   On: 07/10/2017 11:49   Ct Cervical Spine Wo Contrast  Result Date: 07/17/2017 CLINICAL DATA:  Tripped and fell this morning, bruising above RIGHT eye, also fell a few weeks ago, on Coumadin, history chronic systolic heart failure, coronary artery disease, diabetes mellitus, hypertension, ischemic cardiomyopathy, smoker EXAM: CT HEAD WITHOUT CONTRAST CT MAXILLOFACIAL WITHOUT CONTRAST CT CERVICAL SPINE WITHOUT CONTRAST TECHNIQUE: Multidetector CT imaging of the head, cervical spine, and maxillofacial structures were performed using the standard protocol without intravenous contrast. Multiplanar CT image reconstructions of the cervical spine and maxillofacial structures were also generated. Right side of face marked with BB. COMPARISON:  CT head 04/11/2017, 10/28/2010 FINDINGS: CT HEAD FINDINGS Brain: Generalized atrophy. Normal ventricular morphology. No midline shift. Subarachnoid blood identified in the anterior interhemispheric fissure and at multiple sulci in the anterior frontal lobes bilaterally. Additional small amount of subarachnoid blood at the anterior aspect of the RIGHT middle cranial fossa. Small hemorrhagic contusion of the LEFT frontal lobe anteriorly. No evidence of acute infarction or mass lesion. Vascular: Atherosclerotic calcifications of the internal carotid arteries bilaterally at skull base Skull: Calvaria intact Other: N/A CT MAXILLOFACIAL FINDINGS Osseous: Nasal septum midline. Nasal bones and orbits intact. TMJ alignment normal bilaterally. No facial bone fractures identified. Small lucent focus within the lateral wall of the RIGHT orbit minimally increased since 2012. Orbits: Intraorbital soft tissue planes clear. Significant RIGHT  periorbital contusion/hematoma. Sinuses: Small air-fluid level LEFT sphenoid sinus. Small amount of fluid within a few inferior LEFT mastoid air cells. Remaining paranasal sinuses and mastoid air cells clear. Middle ear cavities clear bilaterally. Partial opacification of LEFT external auditory canal. Soft tissues: RIGHT periorbital contusion/hematoma. Remaining facial soft tissues unremarkable. Atherosclerotic calcifications of the carotid bifurcations bilaterally. CT CERVICAL SPINE FINDINGS Alignment: Normal Skull base and vertebrae: Vertebral body heights maintained. Disc space narrowing C4-C5 and C5-C6 with endplate spur formation. Visualized skull base intact. No fracture, subluxation or bone destruction. Scattered facet degenerative changes. Soft tissues and spinal canal: Prevertebral soft tissues normal thickness. Atherosclerotic calcifications at the carotid bifurcations. Remaining visualized soft tissues unremarkable. Disc levels:  Minimally bulging disc at C4-C5. Upper chest: Lung apices clear Other: LEFT side pacemaker leads noted. IMPRESSION: Subarachnoid hemorrhage at the anterior interhemispheric fissure, at sulci in the anterior frontal lobes bilaterally, and at the anterior aspect of the RIGHT middle cranial fossa. Small hemorrhagic contusion of the LEFT frontal lobe. No definite acute facial bone abnormalities. Small amount of nonspecific fluid in the sphenoid sinus and within the LEFT mastoid air cells. Degenerative disc and facet disease changes of the cervical spine. No acute cervical spine abnormalities. Critical Value/emergent results were called by telephone at the time of interpretation on 07/13/2017 at 11:46 am to Dr. Carmin Muskrat , who verbally acknowledged these results. Electronically Signed   By: Lavonia Dana M.D.   On: 07/08/2017 11:49   Ct Maxillofacial Wo Contrast  Result Date: 07/09/2017 CLINICAL DATA:  Tripped and fell this morning, bruising above RIGHT eye, also fell a few  weeks ago, on Coumadin, history chronic systolic heart failure, coronary artery disease, diabetes mellitus, hypertension, ischemic cardiomyopathy, smoker  EXAM: CT HEAD WITHOUT CONTRAST CT MAXILLOFACIAL WITHOUT CONTRAST CT CERVICAL SPINE WITHOUT CONTRAST TECHNIQUE: Multidetector CT imaging of the head, cervical spine, and maxillofacial structures were performed using the standard protocol without intravenous contrast. Multiplanar CT image reconstructions of the cervical spine and maxillofacial structures were also generated. Right side of face marked with BB. COMPARISON:  CT head 04/11/2017, 10/28/2010 FINDINGS: CT HEAD FINDINGS Brain: Generalized atrophy. Normal ventricular morphology. No midline shift. Subarachnoid blood identified in the anterior interhemispheric fissure and at multiple sulci in the anterior frontal lobes bilaterally. Additional small amount of subarachnoid blood at the anterior aspect of the RIGHT middle cranial fossa. Small hemorrhagic contusion of the LEFT frontal lobe anteriorly. No evidence of acute infarction or mass lesion. Vascular: Atherosclerotic calcifications of the internal carotid arteries bilaterally at skull base Skull: Calvaria intact Other: N/A CT MAXILLOFACIAL FINDINGS Osseous: Nasal septum midline. Nasal bones and orbits intact. TMJ alignment normal bilaterally. No facial bone fractures identified. Small lucent focus within the lateral wall of the RIGHT orbit minimally increased since 2012. Orbits: Intraorbital soft tissue planes clear. Significant RIGHT periorbital contusion/hematoma. Sinuses: Small air-fluid level LEFT sphenoid sinus. Small amount of fluid within a few inferior LEFT mastoid air cells. Remaining paranasal sinuses and mastoid air cells clear. Middle ear cavities clear bilaterally. Partial opacification of LEFT external auditory canal. Soft tissues: RIGHT periorbital contusion/hematoma. Remaining facial soft tissues unremarkable. Atherosclerotic calcifications  of the carotid bifurcations bilaterally. CT CERVICAL SPINE FINDINGS Alignment: Normal Skull base and vertebrae: Vertebral body heights maintained. Disc space narrowing C4-C5 and C5-C6 with endplate spur formation. Visualized skull base intact. No fracture, subluxation or bone destruction. Scattered facet degenerative changes. Soft tissues and spinal canal: Prevertebral soft tissues normal thickness. Atherosclerotic calcifications at the carotid bifurcations. Remaining visualized soft tissues unremarkable. Disc levels:  Minimally bulging disc at C4-C5. Upper chest: Lung apices clear Other: LEFT side pacemaker leads noted. IMPRESSION: Subarachnoid hemorrhage at the anterior interhemispheric fissure, at sulci in the anterior frontal lobes bilaterally, and at the anterior aspect of the RIGHT middle cranial fossa. Small hemorrhagic contusion of the LEFT frontal lobe. No definite acute facial bone abnormalities. Small amount of nonspecific fluid in the sphenoid sinus and within the LEFT mastoid air cells. Degenerative disc and facet disease changes of the cervical spine. No acute cervical spine abnormalities. Critical Value/emergent results were called by telephone at the time of interpretation on 07/15/2017 at 11:46 am to Dr. Carmin Muskrat , who verbally acknowledged these results. Electronically Signed   By: Lavonia Dana M.D.   On: 07/06/2017 11:49    Procedures Procedures (including critical care time)  LACERATION REPAIR Performed by: Carmin Muskrat Authorized by: Carmin Muskrat Consent: Verbal consent obtained. Risks and benefits: risks, benefits and alternatives were discussed Consent given by: patient Patient identity confirmed: provided demographic data Prepped and Draped in normal sterile fashion Wound explored  Laceration Location: L lateral inferior lower leg  Laceration Length: 12cm  No Foreign Bodies seen or palpated  Anesthesia: local infiltration  Local anesthetic: lidocaine 1% w  epinephrine  Anesthetic total: 5 ml  Irrigation method: syringe Amount of cleaning: standard  Skin closure: 5-0 sutures  Number of sutures: 7  Technique: as close as possible, though w notation of poor approximation and questionable viability of the flaps.  Patient tolerance: Patient tolerated the procedure well with no immediate complications.   Medications Ordered in ED Medications  lidocaine-EPINEPHrine (XYLOCAINE-EPINEPHrine) 1 %-1:200000 (PF) injection (has no administration in time range)  phytonadione (VITAMIN K) tablet 5 mg (  has no administration in time range)  ondansetron (ZOFRAN) 4 MG/5ML solution 4 mg (has no administration in time range)     Initial Impression / Assessment and Plan / ED Course  I have reviewed the triage vital signs and the nursing notes.  Pertinent labs & imaging results that were available during my care of the patient were reviewed by me and considered in my medical decision making (see chart for details).     After the initial evaluation with consideration of the patient's facial swelling, use of Coumadin, CT scans were ordered, in addition to INR. CT scans notable for intracranial hemorrhage, which I discussed with the radiologist, and subsequent with patient's cardiology team given the patient's dependency on LVAD, need for anticoagulation. We discussed importance of transfer to cardiac critical care unit, and this was facilitated. We discussed indications of reversal given the patient's head bleed versus observation, and after consideration with neurosurgery, the patient will receive oral vitamin K.    On repeat exam the patient is in similar condition, further evaluation of the leg demonstrates a irregular laceration with total length approximately 12 cm, edges are a retracted, nonviable tissue in the inferior central area, active bleeding from venous source proximal anterior aspect. After hemostasis was achieved with injection of lidocaine  with epinephrine, sutures were applied to approximate as much as possible, though the tissue was poor, and there is questionable viability to start with. This procedure was well-tolerated, and after completion, and wrapping with bandage, there is no active bleeding.   2:00 PM Patient preparing for transfer to [facility]. I have discussed the patient's case again with his cardiology team, patient is in similar condition on evaluation, though with increased nausea.   This elderly male with LVAD, chronic anticoagulation presents after mechanical fall, with personal concern of new skin tear/laceration, but with her concern for intracranial pathology given the patient's anticoagulant use, obvious trauma. No evidence for fracture, but there is evidence for intracranial hemorrhage, after discussion with patient's cardiology team, and then with neurosurgery, the patient received vitamin K, required transfer to the ICU for further evaluation and management.  Final Clinical Impressions(s) / ED Diagnoses   Final diagnoses:  Fall, initial encounter  Traumatic hemorrhage of right cerebrum without loss of consciousness, initial encounter ([place])  Laceration of left lower extremity, initial encounter  CRITICAL CARE Performed by: Carmin Muskrat Total critical care time: 45 minutes Critical care time was exclusive of separately billable procedures and treating other patients. Critical care was necessary to treat or prevent imminent or life-threatening deterioration. Critical care was time spent personally by me on the following activities: development of treatment plan with patient and/or surrogate as well as nursing, discussions with consultants, evaluation of patient's response to treatment, examination of patient, obtaining history from patient or surrogate, ordering and performing treatments and interventions, ordering and review of laboratory studies, ordering and review of radiographic studies, pulse  oximetry and re-evaluation of patient's condition.    Carmin Muskrat, MD 07/30/2017 [phone]

## 2017-07-31 NOTE — Progress Notes (Signed)
Pt had vomiting episodes x 2. Emesis had red streaks of blood in it. Dr. Gala RomneyBensimhon and Dr. Franky Machoabbell notified. No new orders at this time.

## 2017-07-31 NOTE — ED Triage Notes (Addendum)
Pt tripped and fell this morning causing a skin tear on left skin. Also c/o of neck and bilateral shoulder pain. Bleeding controlled. Pt is an LVAD patient.

## 2017-08-01 ENCOUNTER — Inpatient Hospital Stay (HOSPITAL_COMMUNITY): Payer: BLUE CROSS/BLUE SHIELD

## 2017-08-01 ENCOUNTER — Encounter (HOSPITAL_COMMUNITY): Admission: EM | Disposition: E | Payer: Self-pay | Source: Home / Self Care | Attending: Cardiology

## 2017-08-01 ENCOUNTER — Encounter (HOSPITAL_COMMUNITY): Payer: Self-pay | Admitting: Anesthesiology

## 2017-08-01 ENCOUNTER — Inpatient Hospital Stay (HOSPITAL_COMMUNITY): Payer: BLUE CROSS/BLUE SHIELD | Admitting: Certified Registered"

## 2017-08-01 DIAGNOSIS — S06340A Traumatic hemorrhage of right cerebrum without loss of consciousness, initial encounter: Secondary | ICD-10-CM

## 2017-08-01 DIAGNOSIS — I619 Nontraumatic intracerebral hemorrhage, unspecified: Secondary | ICD-10-CM | POA: Diagnosis present

## 2017-08-01 DIAGNOSIS — Z978 Presence of other specified devices: Secondary | ICD-10-CM

## 2017-08-01 DIAGNOSIS — Z9889 Other specified postprocedural states: Secondary | ICD-10-CM

## 2017-08-01 DIAGNOSIS — S06340D Traumatic hemorrhage of right cerebrum without loss of consciousness, subsequent encounter: Secondary | ICD-10-CM

## 2017-08-01 HISTORY — PX: CRANIOTOMY: SHX93

## 2017-08-01 LAB — PROTIME-INR
INR: 1.44
INR: 1.38
INR: 1.43
PROTHROMBIN TIME: 17.4 s — AB (ref 11.4–15.2)
PROTHROMBIN TIME: 16.9 s — AB (ref 11.4–15.2)
Prothrombin Time: 17.4 seconds — ABNORMAL HIGH (ref 11.4–15.2)

## 2017-08-01 LAB — CBC
HCT: 28.6 % — ABNORMAL LOW (ref 39.0–52.0)
Hemoglobin: 8.9 g/dL — ABNORMAL LOW (ref 13.0–17.0)
MCH: 26.5 pg (ref 26.0–34.0)
MCHC: 31.1 g/dL (ref 30.0–36.0)
MCV: 85.1 fL (ref 78.0–100.0)
PLATELETS: 141 10*3/uL — AB (ref 150–400)
RBC: 3.36 MIL/uL — AB (ref 4.22–5.81)
RDW: 16.1 % — ABNORMAL HIGH (ref 11.5–15.5)
WBC: 5.8 10*3/uL (ref 4.0–10.5)

## 2017-08-01 LAB — POCT I-STAT 3, ART BLOOD GAS (G3+)
Acid-base deficit: 7 mmol/L — ABNORMAL HIGH (ref 0.0–2.0)
Acid-base deficit: 7 mmol/L — ABNORMAL HIGH (ref 0.0–2.0)
BICARBONATE: 18.8 mmol/L — AB (ref 20.0–28.0)
Bicarbonate: 16.8 mmol/L — ABNORMAL LOW (ref 20.0–28.0)
O2 Saturation: 100 %
O2 Saturation: 97 %
PCO2 ART: 27.8 mmHg — AB (ref 32.0–48.0)
PCO2 ART: 36.4 mmHg (ref 32.0–48.0)
PO2 ART: 252 mmHg — AB (ref 83.0–108.0)
Patient temperature: 98.6
TCO2: 18 mmol/L — ABNORMAL LOW (ref 22–32)
TCO2: 20 mmol/L — ABNORMAL LOW (ref 22–32)
pH, Arterial: 7.322 — ABNORMAL LOW (ref 7.350–7.450)
pH, Arterial: 7.39 (ref 7.350–7.450)
pO2, Arterial: 88 mmHg (ref 83.0–108.0)

## 2017-08-01 LAB — BASIC METABOLIC PANEL
Anion gap: 14 (ref 5–15)
BUN: 19 mg/dL (ref 8–23)
CO2: 20 mmol/L — ABNORMAL LOW (ref 22–32)
CREATININE: 1.4 mg/dL — AB (ref 0.61–1.24)
Calcium: 8 mg/dL — ABNORMAL LOW (ref 8.9–10.3)
Chloride: 102 mmol/L (ref 98–111)
GFR calc Af Amer: >60 mL/min (ref >60)
GFR, EST NON AFRICAN AMERICAN: 52 mL/min — AB (ref >60)
Glucose, Bld: 291 mg/dL — ABNORMAL HIGH (ref 70–99)
POTASSIUM: 3.3 mmol/L — AB (ref 3.5–5.1)
SODIUM: 136 mmol/L (ref 135–145)

## 2017-08-01 LAB — PREPARE FRESH FROZEN PLASMA
UNIT DIVISION: 0
UNIT DIVISION: 0
Unit division: 0

## 2017-08-01 LAB — POCT I-STAT 7, (LYTES, BLD GAS, ICA,H+H)
Acid-base deficit: 5 mmol/L — ABNORMAL HIGH (ref 0.0–2.0)
Bicarbonate: 21.7 mmol/L (ref 20.0–28.0)
CALCIUM ION: 1.13 mmol/L — AB (ref 1.15–1.40)
HEMATOCRIT: 28 % — AB (ref 39.0–52.0)
HEMOGLOBIN: 9.5 g/dL — AB (ref 13.0–17.0)
O2 Saturation: 100 %
POTASSIUM: 3.4 mmol/L — AB (ref 3.5–5.1)
Patient temperature: 34.9
SODIUM: 137 mmol/L (ref 135–145)
TCO2: 23 mmol/L (ref 22–32)
pCO2 arterial: 39.9 mmHg (ref 32.0–48.0)
pH, Arterial: 7.332 — ABNORMAL LOW (ref 7.350–7.450)
pO2, Arterial: 490 mmHg — ABNORMAL HIGH (ref 83.0–108.0)

## 2017-08-01 LAB — SODIUM
SODIUM: 140 mmol/L (ref 135–145)
Sodium: 136 mmol/L (ref 135–145)

## 2017-08-01 LAB — BPAM FFP
BLOOD PRODUCT EXPIRATION DATE: 201908042359
BLOOD PRODUCT EXPIRATION DATE: 201908012359
Blood Product Expiration Date: 201908042359
Blood Product Expiration Date: 201908032359
ISSUE DATE / TIME: 201907301526
ISSUE DATE / TIME: 201907301526
ISSUE DATE / TIME: 201907301930
ISSUE DATE / TIME: 201907301929
UNIT TYPE AND RH: 6200
UNIT TYPE AND RH: 6200
Unit Type and Rh: 6200
Unit Type and Rh: 6200

## 2017-08-01 LAB — POTASSIUM
Potassium: 3.7 mmol/L (ref 3.5–5.1)
Potassium: 3.6 mmol/L (ref 3.5–5.1)

## 2017-08-01 LAB — TRIGLYCERIDES: TRIGLYCERIDES: 65 mg/dL (ref <150)

## 2017-08-01 LAB — GLUCOSE, CAPILLARY
GLUCOSE-CAPILLARY: 203 mg/dL — AB (ref 70–99)
Glucose-Capillary: 166 mg/dL — ABNORMAL HIGH (ref 70–99)
Glucose-Capillary: 295 mg/dL — ABNORMAL HIGH (ref 70–99)
Glucose-Capillary: 298 mg/dL — ABNORMAL HIGH (ref 70–99)

## 2017-08-01 LAB — SURGICAL PCR SCREEN
MRSA, PCR: NEGATIVE
Staphylococcus aureus: NEGATIVE

## 2017-08-01 LAB — OSMOLALITY
OSMOLALITY: 308 mosm/kg — AB (ref 275–295)
OSMOLALITY: 297 mosm/kg — AB (ref 275–295)

## 2017-08-01 LAB — MAGNESIUM: Magnesium: 1.8 mg/dL (ref 1.7–2.4)

## 2017-08-01 LAB — LACTATE DEHYDROGENASE: LDH: 179 U/L (ref 98–192)

## 2017-08-01 SURGERY — CRANIOTOMY HEMATOMA EVACUATION SUBDURAL
Anesthesia: General | Site: Head | Laterality: Left

## 2017-08-01 MED ORDER — FENTANYL CITRATE (PF) 250 MCG/5ML IJ SOLN
INTRAMUSCULAR | Status: AC
  Filled 2017-08-01: qty 5

## 2017-08-01 MED ORDER — LIDOCAINE-EPINEPHRINE 0.5 %-1:200000 IJ SOLN
INTRAMUSCULAR | Status: AC
  Filled 2017-08-01: qty 1

## 2017-08-01 MED ORDER — CLEVIDIPINE BUTYRATE 0.5 MG/ML IV EMUL
0.0000 mg/h | INTRAVENOUS | Status: DC
  Administered 2017-08-01: 1 mg/h via INTRAVENOUS
  Administered 2017-08-01 (×2): 0.5 mg/h via INTRAVENOUS
  Filled 2017-08-01 (×2): qty 50

## 2017-08-01 MED ORDER — SODIUM CHLORIDE 0.9 % IV SOLN
INTRAVENOUS | Status: DC | PRN
  Administered 2017-08-01: 1000 mL via INTRAVENOUS

## 2017-08-01 MED ORDER — ETOMIDATE 2 MG/ML IV SOLN
INTRAVENOUS | Status: AC
  Filled 2017-08-01: qty 10

## 2017-08-01 MED ORDER — THROMBIN (RECOMBINANT) 20000 UNITS EX SOLR
CUTANEOUS | Status: AC
  Filled 2017-08-01: qty 20000

## 2017-08-01 MED ORDER — ORAL CARE MOUTH RINSE
15.0000 mL | OROMUCOSAL | Status: DC
  Administered 2017-08-01 (×3): 15 mL via OROMUCOSAL

## 2017-08-01 MED ORDER — THROMBIN 20000 UNITS EX SOLR
CUTANEOUS | Status: DC | PRN
  Administered 2017-08-01: 20 mL via TOPICAL

## 2017-08-01 MED ORDER — FENTANYL CITRATE (PF) 100 MCG/2ML IJ SOLN: 100.0000 ug | INTRAMUSCULAR | Status: DC | PRN

## 2017-08-01 MED ORDER — LIDOCAINE-EPINEPHRINE 0.5 %-1:200000 IJ SOLN
INTRAMUSCULAR | Status: DC | PRN
  Administered 2017-08-01: 3 mL via INTRADERMAL

## 2017-08-01 MED ORDER — FAMOTIDINE IN NACL 20-0.9 MG/50ML-% IV SOLN
20.0000 mg | Freq: Two times a day (BID) | INTRAVENOUS | Status: DC
  Administered 2017-08-01: 20 mg via INTRAVENOUS
  Filled 2017-08-01: qty 50

## 2017-08-01 MED ORDER — LIDOCAINE HCL (CARDIAC) PF 100 MG/5ML IV SOSY
PREFILLED_SYRINGE | INTRAVENOUS | Status: DC | PRN
  Administered 2017-08-01: 60 mg via INTRATRACHEAL

## 2017-08-01 MED ORDER — PHENYLEPHRINE 40 MCG/ML (10ML) SYRINGE FOR IV PUSH (FOR BLOOD PRESSURE SUPPORT)
PREFILLED_SYRINGE | INTRAVENOUS | Status: AC
  Filled 2017-08-01: qty 10

## 2017-08-01 MED ORDER — CHLORHEXIDINE GLUCONATE 0.12% ORAL RINSE (MEDLINE KIT)
15.0000 mL | Freq: Two times a day (BID) | OROMUCOSAL | Status: DC
  Administered 2017-08-01: 15 mL via OROMUCOSAL

## 2017-08-01 MED ORDER — CEFAZOLIN SODIUM-DEXTROSE 2-4 GM/100ML-% IV SOLN
2.0000 g | Freq: Three times a day (TID) | INTRAVENOUS | Status: DC
  Administered 2017-08-01: 2 g via INTRAVENOUS
  Filled 2017-08-01 (×2): qty 100

## 2017-08-01 MED ORDER — NALOXONE HCL 0.4 MG/ML IJ SOLN: 0.0800 mg | INTRAMUSCULAR | Status: DC | PRN

## 2017-08-01 MED ORDER — BACITRACIN ZINC 500 UNIT/GM EX OINT
TOPICAL_OINTMENT | CUTANEOUS | Status: AC
  Filled 2017-08-01: qty 28.35

## 2017-08-01 MED ORDER — POTASSIUM CHLORIDE 20 MEQ/15ML (10%) PO SOLN
30.0000 meq | Freq: Once | ORAL | Status: AC
  Administered 2017-08-01: 30 meq via ORAL
  Filled 2017-08-01: qty 30

## 2017-08-01 MED ORDER — MORPHINE BOLUS VIA INFUSION
5.0000 mg | INTRAVENOUS | Status: DC | PRN
  Filled 2017-08-01: qty 20

## 2017-08-01 MED ORDER — FENTANYL CITRATE (PF) 250 MCG/5ML IJ SOLN
INTRAMUSCULAR | Status: DC | PRN
  Administered 2017-08-01 (×5): 50 ug via INTRAVENOUS

## 2017-08-01 MED ORDER — VASOPRESSIN 20 UNIT/ML IV SOLN
0.0300 [IU]/min | INTRAVENOUS | Status: DC
  Filled 2017-08-01: qty 2

## 2017-08-01 MED ORDER — LIDOCAINE 2% (20 MG/ML) 5 ML SYRINGE
INTRAMUSCULAR | Status: AC
  Filled 2017-08-01: qty 5

## 2017-08-01 MED ORDER — INSULIN ASPART 100 UNIT/ML ~~LOC~~ SOLN
0.0000 [IU] | SUBCUTANEOUS | Status: DC
  Administered 2017-08-01: 8 [IU] via SUBCUTANEOUS
  Administered 2017-08-01: 3 [IU] via SUBCUTANEOUS
  Administered 2017-08-01: 8 [IU] via SUBCUTANEOUS

## 2017-08-01 MED ORDER — MANNITOL 20 % IV SOLN
25.0000 g | Freq: Four times a day (QID) | Status: DC
  Administered 2017-08-01: 25 g via INTRAVENOUS
  Filled 2017-08-01 (×6): qty 125

## 2017-08-01 MED ORDER — BACITRACIN ZINC 500 UNIT/GM EX OINT
TOPICAL_OINTMENT | CUTANEOUS | Status: DC | PRN
  Administered 2017-08-01: 1 via TOPICAL

## 2017-08-01 MED ORDER — SUCRALFATE 1 G PO TABS
1.0000 g | ORAL_TABLET | Freq: Two times a day (BID) | ORAL | Status: DC
  Administered 2017-08-01: 1 g
  Filled 2017-08-01 (×2): qty 1

## 2017-08-01 MED ORDER — DANAZOL 100 MG PO CAPS
100.0000 mg | ORAL_CAPSULE | Freq: Two times a day (BID) | ORAL | Status: DC
  Administered 2017-08-01: 100 mg
  Filled 2017-08-01 (×2): qty 1

## 2017-08-01 MED ORDER — ROCURONIUM BROMIDE 100 MG/10ML IV SOLN
INTRAVENOUS | Status: DC | PRN
  Administered 2017-08-01 (×3): 50 mg via INTRAVENOUS

## 2017-08-01 MED ORDER — MUPIROCIN 2 % EX OINT
TOPICAL_OINTMENT | Freq: Two times a day (BID) | CUTANEOUS | Status: DC
  Administered 2017-08-01: 1 via NASAL
  Administered 2017-08-01: 10:00:00 via NASAL

## 2017-08-01 MED ORDER — SODIUM CHLORIDE 0.9 % IV SOLN
1.0000 g | Freq: Once | INTRAVENOUS | Status: AC
  Administered 2017-08-01: 1 g via INTRAVENOUS
  Filled 2017-08-01: qty 10

## 2017-08-01 MED ORDER — POTASSIUM CHLORIDE 20 MEQ/15ML (10%) PO SOLN
20.0000 meq | Freq: Once | ORAL | Status: AC
Start: 2017-08-01 — End: 2017-08-01
  Administered 2017-08-01: 20 meq
  Filled 2017-08-01: qty 15

## 2017-08-01 MED ORDER — ATORVASTATIN CALCIUM 80 MG PO TABS
80.0000 mg | ORAL_TABLET | Freq: Every day | ORAL | Status: DC
  Administered 2017-08-01: 80 mg
  Filled 2017-08-01: qty 1

## 2017-08-01 MED ORDER — HYDROCODONE-ACETAMINOPHEN 5-325 MG PO TABS: 1.0000 | ORAL_TABLET | ORAL | Status: DC | PRN

## 2017-08-01 MED ORDER — SUCCINYLCHOLINE CHLORIDE 20 MG/ML IJ SOLN
INTRAMUSCULAR | Status: DC | PRN
  Administered 2017-08-01: 100 mg via INTRAVENOUS

## 2017-08-01 MED ORDER — MICROFIBRILLAR COLL HEMOSTAT EX PADS
MEDICATED_PAD | CUTANEOUS | Status: DC | PRN
  Administered 2017-08-01: 1 via TOPICAL

## 2017-08-01 MED ORDER — FENTANYL CITRATE (PF) 100 MCG/2ML IJ SOLN
100.0000 ug | INTRAMUSCULAR | Status: DC | PRN
Start: 2017-08-01 — End: 2017-08-02

## 2017-08-01 MED ORDER — CEFAZOLIN SODIUM-DEXTROSE 2-3 GM-%(50ML) IV SOLR
INTRAVENOUS | Status: DC | PRN
  Administered 2017-08-01: 2 g via INTRAVENOUS

## 2017-08-01 MED ORDER — SILDENAFIL CITRATE 20 MG PO TABS
20.0000 mg | ORAL_TABLET | Freq: Two times a day (BID) | ORAL | Status: DC
  Administered 2017-08-01: 20 mg
  Filled 2017-08-01: qty 1

## 2017-08-01 MED ORDER — SUCCINYLCHOLINE CHLORIDE 200 MG/10ML IV SOSY
PREFILLED_SYRINGE | INTRAVENOUS | Status: AC
  Filled 2017-08-01: qty 10

## 2017-08-01 MED ORDER — LEVETIRACETAM IN NACL 500 MG/100ML IV SOLN
500.0000 mg | Freq: Two times a day (BID) | INTRAVENOUS | Status: DC
  Administered 2017-08-01: 500 mg via INTRAVENOUS
  Filled 2017-08-01 (×2): qty 100

## 2017-08-01 MED ORDER — ROCURONIUM BROMIDE 10 MG/ML (PF) SYRINGE
PREFILLED_SYRINGE | INTRAVENOUS | Status: AC
  Filled 2017-08-01: qty 20

## 2017-08-01 MED ORDER — MUPIROCIN 2 % EX OINT
TOPICAL_OINTMENT | CUTANEOUS | Status: AC
  Filled 2017-08-01: qty 22

## 2017-08-01 MED ORDER — ETOMIDATE 2 MG/ML IV SOLN
INTRAVENOUS | Status: DC | PRN
  Administered 2017-08-01: 8 mg via INTRAVENOUS

## 2017-08-01 MED ORDER — SODIUM CHLORIDE 0.9 % IJ SOLN
INTRAMUSCULAR | Status: AC
  Filled 2017-08-01: qty 10

## 2017-08-01 MED ORDER — 0.9 % SODIUM CHLORIDE (POUR BTL) OPTIME
TOPICAL | Status: DC | PRN
  Administered 2017-08-01 (×3): 1000 mL

## 2017-08-01 MED ORDER — SODIUM CHLORIDE 0.9 % IV SOLN
10.0000 mg/h | INTRAVENOUS | Status: DC
  Administered 2017-08-01: 2 mg/h via INTRAVENOUS
  Filled 2017-08-01: qty 10

## 2017-08-01 MED ORDER — NOREPINEPHRINE 4 MG/250ML-% IV SOLN
0.0000 ug/min | INTRAVENOUS | Status: DC
  Administered 2017-08-01: 4 ug/min via INTRAVENOUS
  Filled 2017-08-01: qty 250

## 2017-08-01 MED ORDER — ARTIFICIAL TEARS OPHTHALMIC OINT
TOPICAL_OINTMENT | OPHTHALMIC | Status: AC
  Filled 2017-08-01: qty 3.5

## 2017-08-01 MED ORDER — MIDAZOLAM HCL 2 MG/2ML IJ SOLN
INTRAMUSCULAR | Status: AC
  Filled 2017-08-01: qty 2

## 2017-08-01 MED ORDER — MORPHINE 100MG IN NS 100ML (1MG/ML) PREMIX INFUSION
10.0000 mg/h | INTRAVENOUS | Status: DC
  Administered 2017-08-01: 5 mg/h via INTRAVENOUS
  Filled 2017-08-01 (×3): qty 100

## 2017-08-01 MED ORDER — NICARDIPINE HCL IN NACL 20-0.86 MG/200ML-% IV SOLN
3.0000 mg/h | INTRAVENOUS | Status: DC
  Filled 2017-08-01: qty 200

## 2017-08-01 MED ORDER — PROPOFOL 1000 MG/100ML IV EMUL: 0.0000 ug/kg/min | INTRAVENOUS | Status: DC

## 2017-08-01 MED ORDER — VASOPRESSIN 20 UNIT/ML IV SOLN
INTRAVENOUS | Status: AC
  Filled 2017-08-01: qty 1

## 2017-08-01 MED ORDER — MIDAZOLAM BOLUS VIA INFUSION (WITHDRAWAL LIFE SUSTAINING TX)
5.0000 mg | INTRAVENOUS | Status: DC | PRN
  Filled 2017-08-01: qty 20

## 2017-08-01 MED ORDER — SODIUM CHLORIDE 0.9 % IV SOLN
INTRAVENOUS | Status: DC | PRN
  Administered 2017-08-01: 02:00:00 via INTRAVENOUS

## 2017-08-01 MED ORDER — EZETIMIBE 10 MG PO TABS
10.0000 mg | ORAL_TABLET | Freq: Every day | ORAL | Status: DC
  Administered 2017-08-01: 10 mg
  Filled 2017-08-01: qty 1

## 2017-08-01 SURGICAL SUPPLY — 74 items
APL SKNCLS STERI-STRIP NONHPOA (GAUZE/BANDAGES/DRESSINGS)
BENZOIN TINCTURE PRP APPL 2/3 (GAUZE/BANDAGES/DRESSINGS) IMPLANT
BLADE CLIPPER SURG (BLADE) ×3 IMPLANT
BLADE ULTRA TIP 2M (BLADE) ×3 IMPLANT
BNDG GAUZE ELAST 4 BULKY (GAUZE/BANDAGES/DRESSINGS) IMPLANT
BUR ACORN 6.0 PRECISION (BURR) ×2 IMPLANT
BUR ACORN 6.0MM PRECISION (BURR) ×1
BUR MATCHSTICK NEURO 3.0 LAGG (BURR) ×2 IMPLANT
BUR SPIRAL ROUTER 2.3 (BUR) ×1 IMPLANT
BUR SPIRAL ROUTER 2.3MM (BUR) ×1
CANISTER SUCT 3000ML PPV (MISCELLANEOUS) ×3 IMPLANT
CARTRIDGE OIL MAESTRO DRILL (MISCELLANEOUS) ×1 IMPLANT
CATH VENTRIC 35X38 W/TROCAR LG (CATHETERS) ×2 IMPLANT
CLIP VESOCCLUDE MED 6/CT (CLIP) IMPLANT
DIFFUSER DRILL AIR PNEUMATIC (MISCELLANEOUS) ×3 IMPLANT
DRAPE NEUROLOGICAL W/INCISE (DRAPES) ×3 IMPLANT
DRAPE SURG 17X23 STRL (DRAPES) IMPLANT
DRAPE WARM FLUID 44X44 (DRAPE) ×3 IMPLANT
DRSG OPSITE 4X5.5 SM (GAUZE/BANDAGES/DRESSINGS) ×2 IMPLANT
DRSG TELFA 3X8 NADH (GAUZE/BANDAGES/DRESSINGS) ×3 IMPLANT
DURAPREP 6ML APPLICATOR 50/CS (WOUND CARE) ×1 IMPLANT
ELECT REM PT RETURN 9FT ADLT (ELECTROSURGICAL) ×3
ELECTRODE REM PT RTRN 9FT ADLT (ELECTROSURGICAL) ×1 IMPLANT
EVACUATOR 1/8 PVC DRAIN (DRAIN) IMPLANT
EVACUATOR SILICONE 100CC (DRAIN) IMPLANT
FORCEPS BIPOLAR SPETZLER 8 1.0 (NEUROSURGERY SUPPLIES) ×2 IMPLANT
GAUZE SPONGE 4X4 12PLY STRL (GAUZE/BANDAGES/DRESSINGS) ×3 IMPLANT
GAUZE SPONGE 4X4 16PLY XRAY LF (GAUZE/BANDAGES/DRESSINGS) IMPLANT
GLOVE BIOGEL M 7.0 STRL (GLOVE) ×4 IMPLANT
GLOVE BIOGEL PI IND STRL 7.5 (GLOVE) IMPLANT
GLOVE BIOGEL PI INDICATOR 7.5 (GLOVE) ×2
GLOVE ECLIPSE 6.5 STRL STRAW (GLOVE) ×5 IMPLANT
GLOVE EXAM NITRILE LRG STRL (GLOVE) IMPLANT
GLOVE EXAM NITRILE XL STR (GLOVE) IMPLANT
GLOVE EXAM NITRILE XS STR PU (GLOVE) IMPLANT
GOWN STRL REUS W/ TWL LRG LVL3 (GOWN DISPOSABLE) ×2 IMPLANT
GOWN STRL REUS W/ TWL XL LVL3 (GOWN DISPOSABLE) IMPLANT
GOWN STRL REUS W/TWL 2XL LVL3 (GOWN DISPOSABLE) IMPLANT
GOWN STRL REUS W/TWL LRG LVL3 (GOWN DISPOSABLE) ×6
GOWN STRL REUS W/TWL XL LVL3 (GOWN DISPOSABLE)
GRAFT DURAGEN MATRIX 2WX2L ×2 IMPLANT
HEMOSTAT SURGICEL 2X14 (HEMOSTASIS) IMPLANT
KIT BASIN OR (CUSTOM PROCEDURE TRAY) ×5 IMPLANT
KIT CRANIAL ACCESS (MISCELLANEOUS) ×3
KIT CRANIAL ACCESS 5/1X25G (MISCELLANEOUS) IMPLANT
KIT TURNOVER KIT B (KITS) ×3 IMPLANT
NDL HYPO 25X1 1.5 SAFETY (NEEDLE) ×1 IMPLANT
NEEDLE HYPO 25X1 1.5 SAFETY (NEEDLE) ×3 IMPLANT
NS IRRIG 1000ML POUR BTL (IV SOLUTION) ×9 IMPLANT
OIL CARTRIDGE MAESTRO DRILL (MISCELLANEOUS) ×3
PACK CRANIOTOMY CUSTOM (CUSTOM PROCEDURE TRAY) ×3 IMPLANT
PAD DRESSING TELFA 3X8 NADH (GAUZE/BANDAGES/DRESSINGS) IMPLANT
PATTIES SURGICAL .5 X.5 (GAUZE/BANDAGES/DRESSINGS) IMPLANT
PATTIES SURGICAL .5 X3 (DISPOSABLE) IMPLANT
PATTIES SURGICAL 1X1 (DISPOSABLE) IMPLANT
PLATE 1.5/0.6 85X53M SM PANEL (Plate) ×2 IMPLANT
SCREW SELF DRILL HT 1.5/4MM (Screw) ×12 IMPLANT
SPONGE NEURO XRAY DETECT 1X3 (DISPOSABLE) IMPLANT
SPONGE SURGIFOAM ABS GEL 100 (HEMOSTASIS) ×3 IMPLANT
STAPLER VISISTAT 35W (STAPLE) ×3 IMPLANT
SUT ETHILON 3 0 FSL (SUTURE) IMPLANT
SUT ETHILON 3 0 PS 1 (SUTURE) ×2 IMPLANT
SUT ETHILON 4 0 PS 2 18 (SUTURE) ×2 IMPLANT
SUT NURALON 4 0 TR CR/8 (SUTURE) ×5 IMPLANT
SUT STEEL 0 (SUTURE)
SUT STEEL 0 18XMFL TIE 17 (SUTURE) IMPLANT
SUT VIC AB 2-0 CT2 18 VCP726D (SUTURE) ×6 IMPLANT
TOWEL GREEN STERILE (TOWEL DISPOSABLE) ×5 IMPLANT
TOWEL GREEN STERILE FF (TOWEL DISPOSABLE) ×3 IMPLANT
TRAY FOLEY MTR SLVR 16FR STAT (SET/KITS/TRAYS/PACK) ×3 IMPLANT
TUBE CONNECTING 12'X1/4 (SUCTIONS) ×1
TUBE CONNECTING 12X1/4 (SUCTIONS) ×2 IMPLANT
UNDERPAD 30X30 (UNDERPADS AND DIAPERS) ×3 IMPLANT
WATER STERILE IRR 1000ML POUR (IV SOLUTION) ×3 IMPLANT

## 2017-08-02 ENCOUNTER — Encounter (HOSPITAL_COMMUNITY): Payer: Self-pay | Admitting: Neurosurgery

## 2017-08-02 MED FILL — Thrombin (Recombinant) For Soln 20000 Unit: CUTANEOUS | Qty: 1 | Status: AC

## 2017-08-02 NOTE — Progress Notes (Signed)
Patient ID: Dalton RainwaterLarry D Asch, male   DOB: 01-11-1955, 62 y.o.   MRN: [id number] BP 107/72   Pulse 84   Temp 98 F (36.7 C) (Oral)   Resp 18   Ht 6' (1.829 m)   Wt 105.7 kg (233 lb)   SpO2 100%   BMI 31.60 kg/m  Not responsive, pupils dilated not reactive No corneals +flexion with suctioning No real response to noxious stimuli centrally CT scan shows good clot evacuation, but there is increased effacement of the basal  Cisterns. ventric is in the ventricle and draining. Rim subdural on ventricular catheter side.  Poor prognosis, for now will provide maximal treatment. Will start mannitol, check na, and K.

## 2017-08-02 NOTE — Progress Notes (Signed)
Patient is less responsive now than he was. He did not flinch when arterial line was inserted. He wakes up intermittently and follows minimal commands. Neurosurgeon and cardiologist aware and coordinating care.

## 2017-08-02 NOTE — Progress Notes (Signed)
eLink Physician-Brief Progress Note Patient Name: Dalton RainwaterLarry D Fatima DOB: October 14, 1955 MRN: [id number]   Date of Service  August 29, 2017  HPI/Events of Note  Pt scheduled for withdrawal of care this evening. Family surrounding patient awaiting withdrawal of care.  eICU Interventions  No interventions. Reviewed plan with RN.     Intervention Category Minor Interventions: Communication with other healthcare providers and/or family  Migdalia DkOkoronkwo U Ogan August 29, 2017, 9:31 PM

## 2017-08-02 NOTE — Anesthesia Procedure Notes (Signed)
Procedure Name: Intubation Date/Time: 07/05/2017 2:59 AM Performed by: Claudina LickMahony, Glynis Hunsucker D, CRNA Pre-anesthesia Checklist: Patient identified, Emergency Drugs available, Suction available, Patient being monitored and Timeout performed Patient Re-evaluated:Patient Re-evaluated prior to induction Oxygen Delivery Method: Circle system utilized Preoxygenation: Pre-oxygenation with 100% oxygen Induction Type: IV induction, Rapid sequence and Cricoid Pressure applied Laryngoscope Size: [name] and 2 Grade View: Grade I Tube type: Subglottic suction tube Tube size: 7.5 mm Number of attempts: 1 Airway Equipment and Method: Stylet Placement Confirmation: ETT inserted through vocal cords under direct vision,  positive ETCO2 and breath sounds checked- equal and bilateral Secured at: 22 cm Tube secured with: Tape Dental Injury: Teeth and Oropharynx as per pre-operative assessment

## 2017-08-02 NOTE — Procedures (Signed)
Arterial Catheter Insertion Procedure Note [name] [id number] 1955-08-17  Procedure: Insertion of Arterial Catheter  Indications: Blood pressure monitoring and Frequent blood sampling  Procedure Details Consent: Unable to obtain consent because of altered level of consciousness. Time Out: Verified patient identification, verified procedure, site/side was marked, verified correct patient position, special equipment/implants available, medications/allergies/relevent history reviewed, required imaging and test results available.  Performed  Maximum sterile technique was used including antiseptics, cap, gloves, gown, hand hygiene, mask and sheet. Skin prep: Chlorhexidine; local anesthetic administered 20 gauge catheter was inserted into left radial artery using the Seldinger technique. ULTRASOUND GUIDANCE USED: NO Evaluation Blood flow good; BP tracing good. Complications: No apparent complications. Patient doppler positive for collateral circulation prior to placement.  Line leveled and zeroed, site cleansed and dressed per RT protocol.  Normal saline flush used.   [name], [name] Apr 28, 2017

## 2017-08-02 NOTE — Progress Notes (Signed)
Per Dr. Franky Machoabbell, pt to go for emergency craniotomy. Primary RN [name] notified VAD coordinator Maralyn SagoSarah of this plan; she is en route to hospital. Micah FlesherWent over and completed informed consent with family at bedside. Pt given CHG bath and swabbed for surgical staph PCR. Pt not responding to painful stimuli at this time.  Awaiting call to go to OR.

## 2017-08-02 NOTE — Anesthesia Preprocedure Evaluation (Addendum)
Anesthesia Evaluation  Patient identified by MRN, date of birth, ID band Patient unresponsive    Reviewed: Allergy & Precautions, Patient's Chart, lab work & pertinent test results, Unable to perform ROS - Chart review onlyPreop documentation limited or incomplete due to emergent nature of procedure.  Airway   TM Distance: >3 FB Neck ROM: Full    Dental  (+) Edentulous Upper, Edentulous Lower   Pulmonary Current Smoker,     + decreased breath sounds      Cardiovascular hypertension, + CAD, + Peripheral Vascular Disease and +CHF  + pacemaker + Cardiac Defibrillator + Valvular Problems/Murmurs  Rhythm:Regular Rate:Normal     Neuro/Psych  Headaches, negative psych ROS   GI/Hepatic negative GI ROS, Neg liver ROS,   Endo/Other  diabetes  Renal/GU negative Renal ROS     Musculoskeletal  (+) Arthritis ,   Abdominal (+) + obese,   Peds  Hematology   Anesthesia Other Findings - s/p LVAD   Reproductive/Obstetrics                            Lab Results  Component Value Date   WBC 6.1 07/08/2017   HGB 10.2 (L) 07/19/2017   HCT 32.3 (L) 07/07/2017   MCV 83.5 07/14/2017   PLT 144 (L) 07/17/2017   Lab Results  Component Value Date   CREATININE 1.74 (H) 07/23/2017   BUN 25 (H) 07/21/2017   NA 132 (L) 07/03/2017   K 3.7 07/09/2017   CL 99 07/30/2017   CO2 22 07/16/2017   Lab Results  Component Value Date   INR 1.29 07/09/2017   INR 3.97 07/08/2017   INR 4.00 07/20/2017   Echo: - Left ventricle: Mildly dilated LV with EF 15%, diffuse   hypokinesis. LVAD inflow cannula noted, no abnormality. - Aortic valve: The aortic valve is trileaflet, opens rarely. There   was mild aortic insufficiency. No AoV vegetation. - Aorta: Normal caliber aorta with mild plaque. - Mitral valve: No evidence of vegetation. There was mild to   moderate regurgitation. - Left atrium: The atrium was moderately dilated.  No evidence of   thrombus in the atrial cavity or appendage. - Right ventricle: The cavity size was mildly dilated. Systolic   function was normal. - Right atrium: The atrium was mildly dilated. ICD noted passing   through RA, no vegetation. - Tricuspid valve: No evidence of vegetation. Peak RV-RA gradient   (S): 23 mm Hg. - Pulmonic valve: No evidence of vegetation.  Anesthesia Physical Anesthesia Plan  ASA: IV and emergent  Anesthesia Plan: General   Post-op Pain Management:    Induction: Intravenous  PONV Risk Score and Plan: 2 and Ondansetron and Treatment may vary due to age or medical condition  Airway Management Planned: Oral ETT  Additional Equipment: Arterial line  Intra-op Plan:   Post-operative Plan: Post-operative intubation/ventilation  Informed Consent: I have reviewed the patients History and Physical, chart, labs and discussed the procedure including the risks, benefits and alternatives for the proposed anesthesia with the patient or authorized representative who has indicated his/her understanding and acceptance.   Dental advisory given and History available from chart only  Plan Discussed with: CRNA, Anesthesiologist and Surgeon  Anesthesia Plan Comments:        Anesthesia Quick Evaluation

## 2017-08-02 NOTE — Progress Notes (Signed)
Notified Dr. Shirlee LatchMcLean re MAP in the 70's.  Continue to monitor and if MAP drops below 70 may start low dose levophed.

## 2017-08-02 NOTE — Progress Notes (Signed)
LVAD Coordinator Rounding Note:  Admitted with fall and SAH + left frontal hemorrhage.  Had serial head CTs, which showed midline shift and interventricular hemorrhage. He stopped moving right side and became very lethargic. He was taken for emergent craniotomy overnight to evacuate the hematoma.  Ventric drain placed.   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 Maren BeachVanTrigt under Destination Therapy criteria due to current smoking status.  Vital signs: Temp:  HR: 95 Doppler Pressure: 94 Automatic BP: 97/73 (81)  O2 Sat: 100 on ventilator support Wt: 233lb  HVAD Interrogation Reveals: Speed: 2660  Flow: 5.0 Power: 4.3w Peak/Trough: 7/3 Alarms: none Flow alarm limit: 2.5 Power alarm limit: 6.5 Hematocrit:  Suction alarm: on Lavare cycle: on   LVAD speed turned down 2660 2017-12-22 with 2 suction events.  I reviewed the LVAD parameters from today, and compared the results to the patient's prior recorded data.  No programming changes were made.  The LVAD is functioning within specified parameters. LVAD interrogation was negative for any significant power changes, alarms or PI events/speed drops.  LVAD equipment check completed and is in good working order.  Back-up equipment present.   LVAD education done on emergency procedures and precautions and reviewed exit site care.  Drive Line:  Left abdominal sorbaview dressing dry and intact; anchor intact and accurately applied. Weekly dressings; bedside RN may change dressing has needed.   Labs:  LDH trend: 161>179  INR trend: 4>3.97>1.44  WBC: 6.1>5.8  Hgb: 10.2>8.9  Hct: 32.3>28.6  Anticoagulation Plan: -INR Goal: 2.0 - 2.5 -ASA Dose: no ASA due to hx of GI bleed  Blood Products: 2017-12-22-- 4 FFP  Gtts: 2017-12-22: On clevidipine gtt for MAP goal <85 07/16/2017: Mannitol Q 6 hours  Device: - St Jude single lead -Therapies: on at 200 bpm  Adverse Events on VAD: 05/31/16 - Controller power-up associated with pump start event.  Controller change out performed.  06/01/16 - Controller pins lubricated per [employer] Jude reps  06/17/16 - Controller power-up associated with pump start event while changing power source 06/22/16 - Controller pins re-lubricated per [employer] reps   Plan/Recommendations:  1. For repeat head CT this AM 2. Please call VAD pager with any VAD equipment or drive line issues  Alyce PaganAllison Haggard RN, VAD Coordinator 24/7 VAD Pager: [phone]

## 2017-08-02 NOTE — Progress Notes (Signed)
Spoke with VAD coordinator Cindie LarocheSarah H. Regarding ongoing plan for withdrawal. Awaiting son's arrival and then will precede with withdrawal process.  Modena JanskyKevin Vardaan Depascale RN 2 Heart CVICU

## 2017-08-02 NOTE — Progress Notes (Signed)
Inpatient Diabetes Program Recommendations  AACE/ADA: New Consensus Statement on Inpatient Glycemic Control (2015)  Target Ranges:  Prepandial:   less than 140 mg/dL      Peak postprandial:   less than 180 mg/dL (1-2 hours)      Critically ill patients:  140 - 180 mg/dL   Lab Results  Component Value Date   GLUCAP 298 (H) 07/17/2017   HGBA1C 6.0 (H) 07/17/2017    Review of Glycemic Control Results for [name], [name] (MRN [id number]) as of 01/11/2017 13:46  Ref. Range 01/11/2017 06:02  Glucose Latest Ref Range: 70 - 99 mg/dL 213291 (H)   Diabetes history: Type 2 DM  Outpatient Diabetes medications:  70/30 12 units bid Current orders for Inpatient glycemic control: Novolog moderate q 4 hours  Inpatient Diabetes Program Recommendations:    If appropriate, consider IV insulin/ICU glycemic control order set.  CBG's>250 mg/dL.   Thanks,  Dalton MeagerJenny Trinia Georgi, RN, BC-ADM Inpatient Diabetes Coordinator Pager [phone] (8a-5p)

## 2017-08-02 NOTE — Progress Notes (Signed)
eLink Physician-Brief Progress Note Patient Name: Dalton RainwaterLarry D [name] DOB: Apr 27, 1955 MRN: [id number]   Date of Service  2017/06/22  HPI/Events of Note  PCCM consulted for vent management S/p craniotomy-for ICH  eICU Interventions  Vent support as needed PCCM consulted     Intervention Category Evaluation Type: New Patient Evaluation  [name] 2017/06/22, 5:33 AM

## 2017-08-02 NOTE — Progress Notes (Signed)
Pt transported to CT via ventilator. No complications noted. Darrick HuntsmanKatey Fargis RRT notified of return to unit.

## 2017-08-02 NOTE — Progress Notes (Signed)
Terminal extubation at 10/11/17 2154. Hvad pump stoped at 2200. Pt passed at 2227. Family in room and at bedside. Denies chaplin needed. Emotional support given. 40ml versed wasted and 65 ml Morphine wasted in sink. Angelique Blonderobert Muller RN witnessed. Modena JanskyKevin Tondra Reierson RN 2 Heart CVICU

## 2017-08-02 NOTE — Progress Notes (Signed)
Patient ID: Dalton RainwaterLarry D Gravley, male   DOB: 03-Sep-1955, 62 y.o.   MRN: [id number] BP 107/72   Pulse 88   Temp 98 F (36.7 C) (Oral)   Resp (!) 24   Ht 6' (1.829 m)   Wt 105.7 kg (233 lb)   SpO2 97%   BMI 31.60 kg/m  Mr. Falconi's head CT and his exam have worsened. He is very lethargic, not moving his right side. Pupil reactive.  CT scan shows further enlargement of the left frontal hematoma. At this time I believe he will not survive without performing a craniotomy to evacuate the hematoma. I also explained to his father and sister, that he may not survive even with the surgery. They understand the gravity, and his poor prognosis despite our aggressive treatment. They have agreed to the craniotomy, knowing the real risk to the Lvad, and due to his very poor overall health. With this knowledge they have elected to proceed.

## 2017-08-02 NOTE — Procedures (Signed)
Extubation Procedure Note  Patient Details:   Name: Dalton RainwaterLarry D [name] DOB: 1955/04/23 MRN: [id number]   Airway Documentation:    Vent end date: 07/22/2017 Vent end time: 2154   Evaluation  O2 sats: currently acceptable Complications: No apparent complications Patient did tolerate procedure well. Bilateral Breath Sounds: Clear, Diminished   No (Terminal Extubation done)  [name] P 07/17/2017, 9:57 PM

## 2017-08-02 NOTE — Progress Notes (Signed)
No heart sounds auscultated, confirmed by two RNs. EKG shows V-paced rate or 40bpm. Unable to stop pacer with donut magnet. Cards fellow paged for advice.   A-line completely flat at zero. TOD called at 22:27. Will confirm with MD since EKG still shows V-paced rhythm.

## 2017-08-02 NOTE — Progress Notes (Signed)
PCCM update note:   Patient seen by overnight CCM consult group. 62 yo LVAD patient, s/p fall, sustaining an intracranial hemmorrhage, s/p craniectomy with evacuation and EVD.    Case discussed at bedside with Dr. Shirlee LatchMcLean from cardiology.   Exam off sedation. Neuro: Pupils fixed, no cough, no gag, no reflexes, no response to painful stimuli However does have intrinsic RR of 12-15 breaths per min  [name] RN spoke with Neurosurgery, Dr. Franky Machoabbell, he is recommending a STAT repeat CT Head.   I have placed this order.   PCCM will be available throughout the day. Please call with any questions.   Dalton IgoBradley L Icard, DO [facility] 07/09/2017

## 2017-08-02 NOTE — Consult Note (Signed)
PULMONARY / CRITICAL CARE MEDICINE   Name: [name] MRN: [id number] DOB: [birth year]    ADMISSION DATE:  07/25/2017 CONSULTATION DATE:  Aug 08, 2017  REFERRING MD:  Aundra Dubin  CHIEF COMPLAINT:  AMS  HISTORY OF PRESENT ILLNESS:  Pt is encephelopathic; therefore, this HPI is obtained from chart review. [name] is a 62 y.o. male with PMH as outlined below including but not limited to ICM s/p HVAD, followed in VAD clinic and last seen 07/17/17 when he was doing well.  He presented to [facility] 7/30 after tripping and falling. He apparently had several falls prior to this, first starting 2 days prior on Sunday 7/28.  Since then, he had intermittent blacking out episodes. He had head CT that demonstrated a SAH and left frontal lobe hemorrhagic contusion.  INR was 4 (chronic anticoagulation due to LVAD).  Neurosurgery was consulted and decision along with heart failure team was to reverse his coagulopathy despite risk of thrombosis with LVAD.  Goal INR was ~ 1.5 for 48 hours.  Overnight unfortunately head CT and exam worsened.  He was subsequently taken to OR for left frontal craniectomy for hematoma evacuation.  Post op, he returned to the ICU and PCCM was called for vent management.   PAST MEDICAL HISTORY :  He  has a past medical history of AICD (automatic cardioverter/defibrillator) present, Arthritis, Barretts syndrome, Carotid artery disease ([place]), Cerebrovascular disease, Chronic systolic heart failure ([place]), Coronary artery disease, Diabetes mellitus, Dyspnea, Headache, Heart murmur, History of kidney stones, HTN (hypertension), Hyperlipidemia, ICD (implantable cardiac defibrillator), dual, in situ, Ischemic cardiomyopathy, Pleural effusion (01/05/2016), Presence of permanent cardiac pacemaker, PVD (peripheral vascular disease) ([place]), RIATA ICD Lead--on advisory recall ( ), and Tobacco abuse.  PAST SURGICAL HISTORY: He  has a past surgical history that includes Medtronic Virtuoso II DR  dual-chamber cardioverter-defibrillation with pocket revison; Femoral-popliteal Bypass Graft (Right, 11/07/2013); Intraoperative arteriogram (Right, 11/07/2013); implantable cardioverter defibrillator (icd) generator change (N/A, 12/16/2012); abdominal aortagram (N/A, 11/06/2013); lower extremity angiogram (Bilateral, 11/06/2013); Cardiac catheterization (N/A, 09/25/2014); Appendectomy (2000); Femoral-popliteal Bypass Graft (Left, 11/17/2014); US GUIDED THORACENTESIS RIGHT ([facility] HX) (Right, 01/05/2016); Cardiac catheterization (N/A, 01/07/2016); Cardiac catheterization (N/A, 01/28/2016); Cardiac catheterization (N/A, 01/28/2016); Cardiac catheterization (N/A, 01/28/2016); Chest tube insertion (Right, 04/07/2016); Multiple extractions with alveoloplasty (N/A, 04/26/2016); RIGHT HEART CATH (N/A, 05/01/2016); RIGHT HEART CATH (N/A, 05/25/2016); Insertion of implantable left ventricular assist device (N/A, 05/30/2016); TEE without cardioversion (N/A, 05/30/2016); Removal of pleural drainage catheter (Right, 06/13/2016); enteroscopy (N/A, 07/10/2016); Hot hemostasis (N/A, 07/10/2016); Amputation toe (Right, 06/08/2017); and TEE without cardioversion (N/A, 06/11/2017).  Allergies  Allergen Reactions  . Metformin And Related Nausea And Vomiting    *Only the extended release* (does this)  . Niacin And Related Other (See Comments)    REACTION IS SIDE EFFECT "SEVERE" FLUSHING    No current facility-administered medications on file prior to encounter.    Current Outpatient Medications on File Prior to Encounter  Medication Sig  . atorvastatin (LIPITOR) 80 MG tablet Take 1 tablet (80 mg total) by mouth daily.  . blood glucose meter kit and supplies Ultra Blue Kit Dispense based on patient and insurance preference. Use up to four times daily as directed. (FOR ICD-9 250.00, 250.01).  . calcium carbonate (TUMS EX) 750 MG chewable tablet Chew 2 tablets by mouth as needed for heartburn.  . danazol (DANOCRINE) 100 MG capsule Take 1 capsule  (100 mg total) by mouth 2 (two) times daily.  Marland Kitchen ezetimibe (ZETIA) 10 MG tablet Take 1 tablet (10 mg total)  by mouth daily.  . Ferrous Sulfate (IRON) 325 (65 Fe) MG TABS Take 1 tablet (325 mg total) by mouth 2 (two) times daily with a meal.  . insulin NPH-regular Human (NOVOLIN 70/30) (70-30) 100 UNIT/ML injection Inject 12 Units into the skin 2 (two) times daily.   Marland Kitchen losartan (COZAAR) 50 MG tablet Take 1 tablet (50 mg total) by mouth 2 (two) times daily.  . pantoprazole (PROTONIX) 40 MG tablet Take 1 tablet (40 mg total) by mouth 2 (two) times daily.  . potassium chloride SA (K-DUR,KLOR-CON) 20 MEQ tablet Take 1 tablet (20 mEq total) by mouth daily.  . sildenafil (REVATIO) 20 MG tablet Take 1 tablet (20 mg total) by mouth 3 (three) times daily. (Patient taking differently: Take 20 mg by mouth 2 (two) times daily. )  . spironolactone (ALDACTONE) 25 MG tablet Take 0.5 tablets (12.5 mg total) by mouth daily.  . sucralfate (CARAFATE) 1 g tablet Take 1 tablet (1 g total) by mouth 4 (four) times daily -  with meals and at bedtime. (Patient taking differently: Take 1 g by mouth 2 (two) times daily. )  . torsemide (DEMADEX) 20 MG tablet Take 2 tablets (40 mg total) by mouth daily. (Patient taking differently: Take 60 mg by mouth daily. )  . traMADol (ULTRAM) 50 MG tablet Take 1 tablet (50 mg total) by mouth every 6 (six) hours as needed for moderate pain.  . Vitamin D, Ergocalciferol, (DRISDOL) 50000 units CAPS capsule Take 1 capsule by mouth once a week.  . warfarin (COUMADIN) 5 MG tablet Take 1 tablet (5 mg total) by mouth daily at 6 PM. Take 1 tablet (5 mg) daily or as directed by HF Clinic (Patient taking differently: Take 5 mg by mouth daily at 6 PM. Take 1 tablet (5 mg) daily except '10mg'$  Tue/Thur  or as directed by HF Clinic)    FAMILY HISTORY:  His indicated that his mother is alive. He indicated that his father is alive.   SOCIAL HISTORY: He  reports that he has been smoking cigarettes.  He has  a 22.50 pack-year smoking history. He has never used smokeless tobacco. He reports that he does not drink alcohol or use drugs.  REVIEW OF SYSTEMS:  Unable to obtain as pt is encephalopathic.  SUBJECTIVE:  On vent, unresponsive.  VITAL SIGNS: BP 107/72   Pulse 93   Temp 98 F (36.7 C) (Oral)   Resp 18   Ht 6' (1.829 m)   Wt 105.7 kg (233 lb)   SpO2 100%   BMI 31.60 kg/m   HEMODYNAMICS:    VENTILATOR SETTINGS: Vent Mode: PRVC FiO2 (%):  [40 %-100 %] 40 % Set Rate:  [18 bmp] 18 bmp Vt Set:  [277 mL] 620 mL PEEP:  [5 cmH20] 5 cmH20  INTAKE / OUTPUT: I/O last 3 completed shifts: In: 257 [Blood:257] Out: 500 [Urine:500]   PHYSICAL EXAMINATION: General: Adult male, resting in bed, critically ill. Neuro: Sedated, non-responsive. HEENT: Scalp incisions C/D/I.  Ecchymosis to right orbit.  ETT in place. Cardiovascular: RRR, mechanical sounds of LVAD noted.  Lungs: Respirations even and unlabored.  CTA bilaterally, No W/R/R. Abdomen: BS x 4, soft, NT/ND.  Musculoskeletal: Toe amputations on right.  No edema.  Skin: Intact, warm, no rashes.    LABS:  BMET Recent Labs  Lab 07/11/2017 1020  NA 132*  K 3.7  CL 99  CO2 22  BUN 25*  CREATININE 1.74*  GLUCOSE 135*    Electrolytes Recent Labs  Lab 07/23/2017 1020  CALCIUM 8.4*    CBC Recent Labs  Lab 07/15/2017 1020  WBC 6.1  HGB 10.2*  HCT 32.3*  PLT 144*    Coag's Recent Labs  Lab 07/06/2017 1020 07/18/2017 1514 07/10/2017 2229  INR 4.00 3.97 1.29    Sepsis Markers No results for input(s): LATICACIDVEN, PROCALCITON, O2SATVEN in the last 168 hours.  ABG Recent Labs  Lab 08-16-17 0049 2017-08-16 0544  PHART 7.390 7.322*  PCO2ART 27.8* 36.4  PO2ART 88.0 252.0*    Liver Enzymes No results for input(s): AST, ALT, ALKPHOS, BILITOT, ALBUMIN in the last 168 hours.  Cardiac Enzymes No results for input(s): TROPONINI, PROBNP in the last 168 hours.  Glucose Recent Labs  Lab 07/22/2017 1353  07/11/2017 1553 07/05/2017 2109 07/13/2017 2352  GLUCAP 179* 149* 203* 298*    Imaging Ct Head Wo Contrast  Result Date: 08-16-17 CLINICAL DATA:  Follow-up examination for intracranial hemorrhage. EXAM: CT HEAD WITHOUT CONTRAST TECHNIQUE: Contiguous axial images were obtained from the base of the skull through the vertex without intravenous contrast. COMPARISON:  Prior CT from 07/23/2017 FINDINGS: Brain: Intraparenchymal hematoma at the anterior left frontal lobe has enlarged, now measuring 6.1 x 4.2 x 4.0 cm (estimated volume 51 cc), previously 4.2 x 2.0 x 2.7 cm. Worsen surrounding vasogenic edema with regional mass effect. Associated localized left-to-right midline shift of the anterior falx up to 8 mm. Interval blooming of adjacent subarachnoid hemorrhage and/or hemorrhagic contusions at the anterior inferior frontal lobe/gyrus recti. Small volume subarachnoid and/or extra-axial hemorrhage along the anterior falx. Small hemorrhagic contusion at the anteromedial right temporal lobe measures 14 mm without significant mass effect (series 3, image 11). New intraventricular extension with fairly large volume hemorrhage seen within the lateral, third, and fourth ventricles. Increased ventricular dilatation, most evident at the temporal horns, concerning for hydrocephalus. No acute large vessel territory infarct.  No mass lesion. Vascular: No hyperdense vessel. Skull: Right periorbital contusion.  Calvarium unchanged. Sinuses/Orbits: Globes and orbital soft tissues demonstrate no new finding. Persistent air-fluid level within the right sphenoid sinus. Partial opacification of the left mastoid air cells. Other: None. IMPRESSION: 1. Interval increase in size of left frontal hematoma now measuring 51 cc in overall volume, with increased regional mass effect and 8 mm of localized left-to-right shift. Adjacent subarachnoid hemorrhage with additional evolving bifrontal and right temporal hemorrhagic contusions. 2. New  intraventricular extension with blood throughout the ventricular system, with new hydrocephalus. The ordering clinician has been paged regarding these findings, results will be conveyed as soon as possible. Critical Value/emergent results were also called by telephone at the time of interpretation on 2017/08/16 at 1:25 am to the critical care nurse taking care of the patient Purcell Nails who verbally acknowledged these results. Electronically Signed   By: Jeannine Boga M.D.   On: August 16, 2017 01:27   Ct Head Wo Contrast  Result Date: 07/25/2017 CLINICAL DATA:  Subarachnoid hemorrhage follow EXAM: CT HEAD WITHOUT CONTRAST TECHNIQUE: Contiguous axial images were obtained from the base of the skull through the vertex without intravenous contrast. COMPARISON:  Head CT 07/08/2017 FINDINGS: Brain: There is inferior left frontal lobe intraparenchymal hematoma with overlying subarachnoid hemorrhage that extends into the interhemispheric and right sylvian fissures. The hematoma measures 4.2 x 2.0 cm. There is surrounding edema causing rightward bulging the anterior falx cerebri, measuring 3 mm. There is mild mass effect on the lateral ventricles. No hydrocephalus. Vascular: No abnormal hyperdensity of the major intracranial arteries or dural venous sinuses. No intracranial atherosclerosis.  Skull: No skull fracture.  Right supraorbital hematoma. Sinuses/Orbits: Fluid level in the right sphenoid sinus. The orbits are normal. IMPRESSION: 1. Development of left frontal lobe intraparenchymal hematoma measuring 4.2 x 2.0 cm with resultant mild rightward midline shift. 2. Unchanged volume of subarachnoid blood at the left frontal pole and in the interhemispheric fissure and right sylvian fissure. Electronically Signed   By: Ulyses Jarred M.D.   On: 07/29/2017 18:29   Ct Head Wo Contrast  Result Date: 07/10/2017 CLINICAL DATA:  Tripped and fell this morning, bruising above RIGHT eye, also fell a few weeks ago, on  Coumadin, history chronic systolic heart failure, coronary artery disease, diabetes mellitus, hypertension, ischemic cardiomyopathy, smoker EXAM: CT HEAD WITHOUT CONTRAST CT MAXILLOFACIAL WITHOUT CONTRAST CT CERVICAL SPINE WITHOUT CONTRAST TECHNIQUE: Multidetector CT imaging of the head, cervical spine, and maxillofacial structures were performed using the standard protocol without intravenous contrast. Multiplanar CT image reconstructions of the cervical spine and maxillofacial structures were also generated. Right side of face marked with BB. COMPARISON:  CT head 04/11/2017, 10/28/2010 FINDINGS: CT HEAD FINDINGS Brain: Generalized atrophy. Normal ventricular morphology. No midline shift. Subarachnoid blood identified in the anterior interhemispheric fissure and at multiple sulci in the anterior frontal lobes bilaterally. Additional small amount of subarachnoid blood at the anterior aspect of the RIGHT middle cranial fossa. Small hemorrhagic contusion of the LEFT frontal lobe anteriorly. No evidence of acute infarction or mass lesion. Vascular: Atherosclerotic calcifications of the internal carotid arteries bilaterally at skull base Skull: Calvaria intact Other: N/A CT MAXILLOFACIAL FINDINGS Osseous: Nasal septum midline. Nasal bones and orbits intact. TMJ alignment normal bilaterally. No facial bone fractures identified. Small lucent focus within the lateral wall of the RIGHT orbit minimally increased since 2012. Orbits: Intraorbital soft tissue planes clear. Significant RIGHT periorbital contusion/hematoma. Sinuses: Small air-fluid level LEFT sphenoid sinus. Small amount of fluid within a few inferior LEFT mastoid air cells. Remaining paranasal sinuses and mastoid air cells clear. Middle ear cavities clear bilaterally. Partial opacification of LEFT external auditory canal. Soft tissues: RIGHT periorbital contusion/hematoma. Remaining facial soft tissues unremarkable. Atherosclerotic calcifications of the carotid  bifurcations bilaterally. CT CERVICAL SPINE FINDINGS Alignment: Normal Skull base and vertebrae: Vertebral body heights maintained. Disc space narrowing C4-C5 and C5-C6 with endplate spur formation. Visualized skull base intact. No fracture, subluxation or bone destruction. Scattered facet degenerative changes. Soft tissues and spinal canal: Prevertebral soft tissues normal thickness. Atherosclerotic calcifications at the carotid bifurcations. Remaining visualized soft tissues unremarkable. Disc levels:  Minimally bulging disc at C4-C5. Upper chest: Lung apices clear Other: LEFT side pacemaker leads noted. IMPRESSION: Subarachnoid hemorrhage at the anterior interhemispheric fissure, at sulci in the anterior frontal lobes bilaterally, and at the anterior aspect of the RIGHT middle cranial fossa. Small hemorrhagic contusion of the LEFT frontal lobe. No definite acute facial bone abnormalities. Small amount of nonspecific fluid in the sphenoid sinus and within the LEFT mastoid air cells. Degenerative disc and facet disease changes of the cervical spine. No acute cervical spine abnormalities. Critical Value/emergent results were called by telephone at the time of interpretation on 07/05/2017 at 11:46 am to Dr. Carmin Muskrat , who verbally acknowledged these results. Electronically Signed   By: Lavonia Dana M.D.   On: 07/10/2017 11:49   Ct Cervical Spine Wo Contrast  Result Date: 07/07/2017 CLINICAL DATA:  Tripped and fell this morning, bruising above RIGHT eye, also fell a few weeks ago, on Coumadin, history chronic systolic heart failure, coronary artery disease, diabetes mellitus, hypertension, ischemic cardiomyopathy,  smoker EXAM: CT HEAD WITHOUT CONTRAST CT MAXILLOFACIAL WITHOUT CONTRAST CT CERVICAL SPINE WITHOUT CONTRAST TECHNIQUE: Multidetector CT imaging of the head, cervical spine, and maxillofacial structures were performed using the standard protocol without intravenous contrast. Multiplanar CT image  reconstructions of the cervical spine and maxillofacial structures were also generated. Right side of face marked with BB. COMPARISON:  CT head 04/11/2017, 10/28/2010 FINDINGS: CT HEAD FINDINGS Brain: Generalized atrophy. Normal ventricular morphology. No midline shift. Subarachnoid blood identified in the anterior interhemispheric fissure and at multiple sulci in the anterior frontal lobes bilaterally. Additional small amount of subarachnoid blood at the anterior aspect of the RIGHT middle cranial fossa. Small hemorrhagic contusion of the LEFT frontal lobe anteriorly. No evidence of acute infarction or mass lesion. Vascular: Atherosclerotic calcifications of the internal carotid arteries bilaterally at skull base Skull: Calvaria intact Other: N/A CT MAXILLOFACIAL FINDINGS Osseous: Nasal septum midline. Nasal bones and orbits intact. TMJ alignment normal bilaterally. No facial bone fractures identified. Small lucent focus within the lateral wall of the RIGHT orbit minimally increased since 2012. Orbits: Intraorbital soft tissue planes clear. Significant RIGHT periorbital contusion/hematoma. Sinuses: Small air-fluid level LEFT sphenoid sinus. Small amount of fluid within a few inferior LEFT mastoid air cells. Remaining paranasal sinuses and mastoid air cells clear. Middle ear cavities clear bilaterally. Partial opacification of LEFT external auditory canal. Soft tissues: RIGHT periorbital contusion/hematoma. Remaining facial soft tissues unremarkable. Atherosclerotic calcifications of the carotid bifurcations bilaterally. CT CERVICAL SPINE FINDINGS Alignment: Normal Skull base and vertebrae: Vertebral body heights maintained. Disc space narrowing C4-C5 and C5-C6 with endplate spur formation. Visualized skull base intact. No fracture, subluxation or bone destruction. Scattered facet degenerative changes. Soft tissues and spinal canal: Prevertebral soft tissues normal thickness. Atherosclerotic calcifications at the  carotid bifurcations. Remaining visualized soft tissues unremarkable. Disc levels:  Minimally bulging disc at C4-C5. Upper chest: Lung apices clear Other: LEFT side pacemaker leads noted. IMPRESSION: Subarachnoid hemorrhage at the anterior interhemispheric fissure, at sulci in the anterior frontal lobes bilaterally, and at the anterior aspect of the RIGHT middle cranial fossa. Small hemorrhagic contusion of the LEFT frontal lobe. No definite acute facial bone abnormalities. Small amount of nonspecific fluid in the sphenoid sinus and within the LEFT mastoid air cells. Degenerative disc and facet disease changes of the cervical spine. No acute cervical spine abnormalities. Critical Value/emergent results were called by telephone at the time of interpretation on 07/19/2017 at 11:46 am to Dr. Carmin Muskrat , who verbally acknowledged these results. Electronically Signed   By: Lavonia Dana M.D.   On: 07/21/2017 11:49   Ct Maxillofacial Wo Contrast  Result Date: 07/30/2017 CLINICAL DATA:  Tripped and fell this morning, bruising above RIGHT eye, also fell a few weeks ago, on Coumadin, history chronic systolic heart failure, coronary artery disease, diabetes mellitus, hypertension, ischemic cardiomyopathy, smoker EXAM: CT HEAD WITHOUT CONTRAST CT MAXILLOFACIAL WITHOUT CONTRAST CT CERVICAL SPINE WITHOUT CONTRAST TECHNIQUE: Multidetector CT imaging of the head, cervical spine, and maxillofacial structures were performed using the standard protocol without intravenous contrast. Multiplanar CT image reconstructions of the cervical spine and maxillofacial structures were also generated. Right side of face marked with BB. COMPARISON:  CT head 04/11/2017, 10/28/2010 FINDINGS: CT HEAD FINDINGS Brain: Generalized atrophy. Normal ventricular morphology. No midline shift. Subarachnoid blood identified in the anterior interhemispheric fissure and at multiple sulci in the anterior frontal lobes bilaterally. Additional small amount  of subarachnoid blood at the anterior aspect of the RIGHT middle cranial fossa. Small hemorrhagic contusion of the LEFT frontal  lobe anteriorly. No evidence of acute infarction or mass lesion. Vascular: Atherosclerotic calcifications of the internal carotid arteries bilaterally at skull base Skull: Calvaria intact Other: N/A CT MAXILLOFACIAL FINDINGS Osseous: Nasal septum midline. Nasal bones and orbits intact. TMJ alignment normal bilaterally. No facial bone fractures identified. Small lucent focus within the lateral wall of the RIGHT orbit minimally increased since 2012. Orbits: Intraorbital soft tissue planes clear. Significant RIGHT periorbital contusion/hematoma. Sinuses: Small air-fluid level LEFT sphenoid sinus. Small amount of fluid within a few inferior LEFT mastoid air cells. Remaining paranasal sinuses and mastoid air cells clear. Middle ear cavities clear bilaterally. Partial opacification of LEFT external auditory canal. Soft tissues: RIGHT periorbital contusion/hematoma. Remaining facial soft tissues unremarkable. Atherosclerotic calcifications of the carotid bifurcations bilaterally. CT CERVICAL SPINE FINDINGS Alignment: Normal Skull base and vertebrae: Vertebral body heights maintained. Disc space narrowing C4-C5 and C5-C6 with endplate spur formation. Visualized skull base intact. No fracture, subluxation or bone destruction. Scattered facet degenerative changes. Soft tissues and spinal canal: Prevertebral soft tissues normal thickness. Atherosclerotic calcifications at the carotid bifurcations. Remaining visualized soft tissues unremarkable. Disc levels:  Minimally bulging disc at C4-C5. Upper chest: Lung apices clear Other: LEFT side pacemaker leads noted. IMPRESSION: Subarachnoid hemorrhage at the anterior interhemispheric fissure, at sulci in the anterior frontal lobes bilaterally, and at the anterior aspect of the RIGHT middle cranial fossa. Small hemorrhagic contusion of the LEFT frontal lobe.  No definite acute facial bone abnormalities. Small amount of nonspecific fluid in the sphenoid sinus and within the LEFT mastoid air cells. Degenerative disc and facet disease changes of the cervical spine. No acute cervical spine abnormalities. Critical Value/emergent results were called by telephone at the time of interpretation on 07/27/2017 at 11:46 am to Dr. Carmin Muskrat , who verbally acknowledged these results. Electronically Signed   By: Lavonia Dana M.D.   On: 07/06/2017 11:49     STUDIES:  CT head 7/30 > SAH with small hemorrhagic contusion of left frontal lobe. CT head 7/30 > worsening of left frontal lobe hematoma with mile R to L MLS. CT head 7/31 > interval increase in size of left frontal hematoma, SAH with additional evolving bifrontal and right temporal hemorrhagic contusions, new IVH with new hydrocephalus.  CULTURES: None.  ANTIBIOTICS: None.  SIGNIFICANT EVENTS: 7/30 > admit. 7/31 > to OR for left frontal craniectomy for hematoma evacuation.  LINES/TUBES: ETT 7/31 >  Art line 7/31 >   DISCUSSION: 62 y.o. male with ICM s/p LVAD placement, had multiple falls at home.  Taken to [facility] ED 7/30 and found to have SAH and frontal hematoma.  Transferred to [facility] ICU, coagulopathy reversed.  Overnight had worsening exam and extension of SAH /  Hematoma; therefore, taken to OR for left frontal craniectomy for hematoma evacuation.  ASSESSMENT / PLAN:  PULMONARY A: Respiratory insufficiency - s/p intubation for OR procedure. Hx right pleural effusion - was s/p pleur-x; now resolved. P:   Full vent support. Wean as able. VAP prevention measures. SBT in AM once stabilized / OK with neurosurgery and heart failure. CXR in AM.  CARDIOVASCULAR A:  ICM s/p LVAD placement - echo in March 2018 with EF 20 - 25%. Hx PAD (has occluded left fem-pop bypass that is being medically managed), HLD, HTN, CAD. Limited Code Status - no chest compressions. P:  Heart failure team following /  managing. Will need careful attention to anticoagulation moving forward - per heart failure / neurosurgery. F/u as outpatient with Dr. Scot Dock for PAD.  RENAL A:   Hypokalemia. Hypocalcemia. AKI. P:   40 mEq K per tube. 1g Ca gluconate. BMP in AM.  GASTROINTESTINAL A:   GI prophylaxis. Nutrition. Hx UGIB - was treated with octreotide injections but did not tolerate.  Switched to danazol and doing OK. P:   SUP: Pantoprazole. NPO. Continue danazol.  HEMATOLOGIC A:   Chronic anticoagulation - s/p reversal. Anemia. VTE Prophylaxis. P:  Monitor INR. Transfuse for Hgb < 7. SCD's only for now. CBC in AM.  INFECTIOUS A:   No indication of infection. 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 P:   Monitor clinically.  ENDOCRINE A:   Hx DM. P:   SSI.  NEUROLOGIC A:   Sedation needs due to mechanical ventilation. SAH with left frontal lobe hemorrhagic contusion - s/p left frontal craniectomy for hematoma evacuation. Hx falls (complicated by above), L1 burst fx. P:   Sedation:  Propofol gtt / Fentanyl PRN. RASS goal: 0 to -1. Daily WUA. Post op care per neurosurgery.  Family updated: None available.  Interdisciplinary Family Meeting v Palliative Care Meeting:  Due by: 08/08/17.  CC time: 40 min.   Montey Hora, [facility] Pager: [phone]  or [phone] August 27, 2017, 6:05 AM

## 2017-08-02 NOTE — Progress Notes (Addendum)
Advanced Heart Failure VAD Team Note  PCP-Cardiologist: No primary care provider on file.   Subjective:    Admitted with fall and SAH + left frontal hemorrhage.  Had serial head CTs, which showed midline shift and interventricular hemorrhage. He stopped moving right side and became very lethargic. He was taken for emergent craniotomy overnight to evacuate the hematoma.  Vent drain placed.   Creatinine 1.4, K 3.3  LDH stable 179, hemoglobin 8.9, INR 1.44 (s/p 4 units FFPs yesterday).  On clevidipine gtt for MAP goal <85.   LVAD speed turned down 2660 yesterday with 2 suction events.   Propofol drip on hold. He has no cough, gag, or corneal reflexes. No movement. Pupils fixed and dilated with no reaction to light. He does have spontaneous respiration.   LVAD INTERROGATION:  [employer] LVAD:   Flow 4.4 liters/min, speed 2660, power 4.2.  2 suction events yesterday morning.    Objective:    Vital Signs:   Temp:  [98 F (36.7 C)-99.1 F (37.3 C)] 98 F (36.7 C) (07/30 2145) Pulse Rate:  [36-95] 84 (07/31 0645) Resp:  [16-27] 18 (07/31 0645) BP: (67-107)/(48-72) 107/72 (07/30 1740) SpO2:  [95 %-100 %] 100 % (07/31 0645) Arterial Line BP: (116-273)/(37-86) 133/75 (07/31 0645) FiO2 (%):  [40 %-100 %] 40 % (07/31 0558) Weight:  [233 lb (105.7 kg)] 233 lb (105.7 kg) (07/30 0928) Last BM Date: Aug 09, 2017 Mean arterial Pressure 78-98  Intake/Output:   Intake/Output Summary (Last 24 hours) at 07/30/2017 0704 Last data filed at 07/31/2017 1027 Gross per 24 hour  Intake 2688.99 ml  Output 1691 ml  Net 997.99 ml     Physical Exam    General: Intubated HEENT: + ETT right periorbital edema and ecchymosis. Pupils fixed and dilated, oval shaped. No reaction to light.  Neck: Supple. JVP 10-12. Carotids 2+ bilat; no bruits. No thyromegaly or nodule noted. Cor: PMI nondisplaced. RRR, No M/G/R noted. LVAD hum present Lungs: Diminished basilar sounds Abdomen: Soft, non-tender,  non-distended, no HSM. No bruits or masses. +BS  Driveline: C/D/I; securement device intact and driveline  Extremities: No cyanosis, clubbing, or rash. Trace ankle edema.  Neuro: Intubated. No movement to painful stimuli. No cough, gag, or corneal reflexes. Ventricular drain in place with pink tinged fluid  Telemetry   NSR 80-90s. Personally reviewed.   EKG    No new tracings.   Labs   Basic Metabolic Panel: Recent Labs  Lab 08-09-2017 1020 07/15/2017 0322 07/31/2017 0602  NA 132* 137 136  K 3.7 3.4* 3.3*  CL 99  --  102  CO2 22  --  20*  GLUCOSE 135*  --  291*  BUN 25*  --  19  CREATININE 1.74*  --  1.40*  CALCIUM 8.4*  --  8.0*    Liver Function Tests: No results for input(s): AST, ALT, ALKPHOS, BILITOT, PROT, ALBUMIN in the last 168 hours. No results for input(s): LIPASE, AMYLASE in the last 168 hours. No results for input(s): AMMONIA in the last 168 hours.  CBC: Recent Labs  Lab 08-09-17 1020 07/31/2017 0322 07/03/2017 0602  WBC 6.1  --  5.8  NEUTROABS 4.9  --   --   HGB 10.2* 9.5* 8.9*  HCT 32.3* 28.0* 28.6*  MCV 83.5  --  85.1  PLT 144*  --  141*    INR: Recent Labs  Lab August 09, 2017 1020 Aug 09, 2017 1514 Aug 09, 2017 2229 07/06/2017 0602  INR 4.00 3.97 1.29 1.44    Other results:  EKG:  Imaging   Ct Head Wo Contrast  Result Date: 07/15/2017 CLINICAL DATA:  Follow-up examination for intracranial hemorrhage. EXAM: CT HEAD WITHOUT CONTRAST TECHNIQUE: Contiguous axial images were obtained from the base of the skull through the vertex without intravenous contrast. COMPARISON:  Prior CT from 2017/08/25 FINDINGS: Brain: Intraparenchymal hematoma at the anterior left frontal lobe has enlarged, now measuring 6.1 x 4.2 x 4.0 cm (estimated volume 51 cc), previously 4.2 x 2.0 x 2.7 cm. Worsen surrounding vasogenic edema with regional mass effect. Associated localized left-to-right midline shift of the anterior falx up to 8 mm. Interval blooming of adjacent subarachnoid  hemorrhage and/or hemorrhagic contusions at the anterior inferior frontal lobe/gyrus recti. Small volume subarachnoid and/or extra-axial hemorrhage along the anterior falx. Small hemorrhagic contusion at the anteromedial right temporal lobe measures 14 mm without significant mass effect (series 3, image 11). New intraventricular extension with fairly large volume hemorrhage seen within the lateral, third, and fourth ventricles. Increased ventricular dilatation, most evident at the temporal horns, concerning for hydrocephalus. No acute large vessel territory infarct.  No mass lesion. Vascular: No hyperdense vessel. Skull: Right periorbital contusion.  Calvarium unchanged. Sinuses/Orbits: Globes and orbital soft tissues demonstrate no new finding. Persistent air-fluid level within the right sphenoid sinus. Partial opacification of the left mastoid air cells. Other: None. IMPRESSION: 1. Interval increase in size of left frontal hematoma now measuring 51 cc in overall volume, with increased regional mass effect and 8 mm of localized left-to-right shift. Adjacent subarachnoid hemorrhage with additional evolving bifrontal and right temporal hemorrhagic contusions. 2. New intraventricular extension with blood throughout the ventricular system, with new hydrocephalus. The ordering clinician has been paged regarding these findings, results will be conveyed as soon as possible. Critical Value/emergent results were also called by telephone at the time of interpretation on 07/24/2017 at 1:25 am to the critical care nurse taking care of the patient Anselm Lis who verbally acknowledged these results. Electronically Signed   By: Rise Mu M.D.   On: 07/14/2017 01:27   Ct Head Wo Contrast  Result Date: 25-Aug-2017 CLINICAL DATA:  Subarachnoid hemorrhage follow EXAM: CT HEAD WITHOUT CONTRAST TECHNIQUE: Contiguous axial images were obtained from the base of the skull through the vertex without intravenous contrast.  COMPARISON:  Head CT 08-25-2017 FINDINGS: Brain: There is inferior left frontal lobe intraparenchymal hematoma with overlying subarachnoid hemorrhage that extends into the interhemispheric and right sylvian fissures. The hematoma measures 4.2 x 2.0 cm. There is surrounding edema causing rightward bulging the anterior falx cerebri, measuring 3 mm. There is mild mass effect on the lateral ventricles. No hydrocephalus. Vascular: No abnormal hyperdensity of the major intracranial arteries or dural venous sinuses. No intracranial atherosclerosis. Skull: No skull fracture.  Right supraorbital hematoma. Sinuses/Orbits: Fluid level in the right sphenoid sinus. The orbits are normal. IMPRESSION: 1. Development of left frontal lobe intraparenchymal hematoma measuring 4.2 x 2.0 cm with resultant mild rightward midline shift. 2. Unchanged volume of subarachnoid blood at the left frontal pole and in the interhemispheric fissure and right sylvian fissure. Electronically Signed   By: Deatra Robinson M.D.   On: 25-Aug-2017 18:29   Ct Head Wo Contrast  Result Date: 08-25-17 CLINICAL DATA:  Tripped and fell this morning, bruising above RIGHT eye, also fell a few weeks ago, on Coumadin, history chronic systolic heart failure, coronary artery disease, diabetes mellitus, hypertension, ischemic cardiomyopathy, smoker EXAM: CT HEAD WITHOUT CONTRAST CT MAXILLOFACIAL WITHOUT CONTRAST CT CERVICAL SPINE WITHOUT CONTRAST TECHNIQUE: Multidetector CT imaging of the head, cervical  spine, and maxillofacial structures were performed using the standard protocol without intravenous contrast. Multiplanar CT image reconstructions of the cervical spine and maxillofacial structures were also generated. Right side of face marked with BB. COMPARISON:  CT head 04/11/2017, 10/28/2010 FINDINGS: CT HEAD FINDINGS Brain: Generalized atrophy. Normal ventricular morphology. No midline shift. Subarachnoid blood identified in the anterior interhemispheric fissure  and at multiple sulci in the anterior frontal lobes bilaterally. Additional small amount of subarachnoid blood at the anterior aspect of the RIGHT middle cranial fossa. Small hemorrhagic contusion of the LEFT frontal lobe anteriorly. No evidence of acute infarction or mass lesion. Vascular: Atherosclerotic calcifications of the internal carotid arteries bilaterally at skull base Skull: Calvaria intact Other: N/A CT MAXILLOFACIAL FINDINGS Osseous: Nasal septum midline. Nasal bones and orbits intact. TMJ alignment normal bilaterally. No facial bone fractures identified. Small lucent focus within the lateral wall of the RIGHT orbit minimally increased since 2012. Orbits: Intraorbital soft tissue planes clear. Significant RIGHT periorbital contusion/hematoma. Sinuses: Small air-fluid level LEFT sphenoid sinus. Small amount of fluid within a few inferior LEFT mastoid air cells. Remaining paranasal sinuses and mastoid air cells clear. Middle ear cavities clear bilaterally. Partial opacification of LEFT external auditory canal. Soft tissues: RIGHT periorbital contusion/hematoma. Remaining facial soft tissues unremarkable. Atherosclerotic calcifications of the carotid bifurcations bilaterally. CT CERVICAL SPINE FINDINGS Alignment: Normal Skull base and vertebrae: Vertebral body heights maintained. Disc space narrowing C4-C5 and C5-C6 with endplate spur formation. Visualized skull base intact. No fracture, subluxation or bone destruction. Scattered facet degenerative changes. Soft tissues and spinal canal: Prevertebral soft tissues normal thickness. Atherosclerotic calcifications at the carotid bifurcations. Remaining visualized soft tissues unremarkable. Disc levels:  Minimally bulging disc at C4-C5. Upper chest: Lung apices clear Other: LEFT side pacemaker leads noted. IMPRESSION: Subarachnoid hemorrhage at the anterior interhemispheric fissure, at sulci in the anterior frontal lobes bilaterally, and at the anterior aspect  of the RIGHT middle cranial fossa. Small hemorrhagic contusion of the LEFT frontal lobe. No definite acute facial bone abnormalities. Small amount of nonspecific fluid in the sphenoid sinus and within the LEFT mastoid air cells. Degenerative disc and facet disease changes of the cervical spine. No acute cervical spine abnormalities. Critical Value/emergent results were called by telephone at the time of interpretation on 07/21/2017 at 11:46 am to Dr. Gerhard Munch , who verbally acknowledged these results. Electronically Signed   By: Ulyses Southward M.D.   On: 07/22/2017 11:49   Ct Cervical Spine Wo Contrast  Result Date: 07/04/2017 CLINICAL DATA:  Tripped and fell this morning, bruising above RIGHT eye, also fell a few weeks ago, on Coumadin, history chronic systolic heart failure, coronary artery disease, diabetes mellitus, hypertension, ischemic cardiomyopathy, smoker EXAM: CT HEAD WITHOUT CONTRAST CT MAXILLOFACIAL WITHOUT CONTRAST CT CERVICAL SPINE WITHOUT CONTRAST TECHNIQUE: Multidetector CT imaging of the head, cervical spine, and maxillofacial structures were performed using the standard protocol without intravenous contrast. Multiplanar CT image reconstructions of the cervical spine and maxillofacial structures were also generated. Right side of face marked with BB. COMPARISON:  CT head 04/11/2017, 10/28/2010 FINDINGS: CT HEAD FINDINGS Brain: Generalized atrophy. Normal ventricular morphology. No midline shift. Subarachnoid blood identified in the anterior interhemispheric fissure and at multiple sulci in the anterior frontal lobes bilaterally. Additional small amount of subarachnoid blood at the anterior aspect of the RIGHT middle cranial fossa. Small hemorrhagic contusion of the LEFT frontal lobe anteriorly. No evidence of acute infarction or mass lesion. Vascular: Atherosclerotic calcifications of the internal carotid arteries bilaterally at skull base Skull:  Calvaria intact Other: N/A CT MAXILLOFACIAL  FINDINGS Osseous: Nasal septum midline. Nasal bones and orbits intact. TMJ alignment normal bilaterally. No facial bone fractures identified. Small lucent focus within the lateral wall of the RIGHT orbit minimally increased since 2012. Orbits: Intraorbital soft tissue planes clear. Significant RIGHT periorbital contusion/hematoma. Sinuses: Small air-fluid level LEFT sphenoid sinus. Small amount of fluid within a few inferior LEFT mastoid air cells. Remaining paranasal sinuses and mastoid air cells clear. Middle ear cavities clear bilaterally. Partial opacification of LEFT external auditory canal. Soft tissues: RIGHT periorbital contusion/hematoma. Remaining facial soft tissues unremarkable. Atherosclerotic calcifications of the carotid bifurcations bilaterally. CT CERVICAL SPINE FINDINGS Alignment: Normal Skull base and vertebrae: Vertebral body heights maintained. Disc space narrowing C4-C5 and C5-C6 with endplate spur formation. Visualized skull base intact. No fracture, subluxation or bone destruction. Scattered facet degenerative changes. Soft tissues and spinal canal: Prevertebral soft tissues normal thickness. Atherosclerotic calcifications at the carotid bifurcations. Remaining visualized soft tissues unremarkable. Disc levels:  Minimally bulging disc at C4-C5. Upper chest: Lung apices clear Other: LEFT side pacemaker leads noted. IMPRESSION: Subarachnoid hemorrhage at the anterior interhemispheric fissure, at sulci in the anterior frontal lobes bilaterally, and at the anterior aspect of the RIGHT middle cranial fossa. Small hemorrhagic contusion of the LEFT frontal lobe. No definite acute facial bone abnormalities. Small amount of nonspecific fluid in the sphenoid sinus and within the LEFT mastoid air cells. Degenerative disc and facet disease changes of the cervical spine. No acute cervical spine abnormalities. Critical Value/emergent results were called by telephone at the time of interpretation on  07/07/2017 at 11:46 am to Dr. Gerhard Munch , who verbally acknowledged these results. Electronically Signed   By: Ulyses Southward M.D.   On: 07/03/2017 11:49   Dg Chest Port 1 View  Result Date: 28-Aug-2017 CLINICAL DATA:  Endotracheal tube placement EXAM: PORTABLE CHEST 1 VIEW COMPARISON:  06/07/2017 FINDINGS: Endotracheal tube tip at the clavicular heads. An orogastric tube at least reaches the stomach. Single chamber ICD/pacer from the left. Interstitial opacities at the bases and cephalized blood flow. No effusion, pneumothorax, or air bronchogram. Normal heart size. IMPRESSION: 1. Endotracheal and orogastric tubes are in good position. 2. Edema or atelectasis at the bases. Electronically Signed   By: Marnee Spring M.D.   On: 2017-08-28 07:01   Ct Maxillofacial Wo Contrast  Result Date: 07/02/2017 CLINICAL DATA:  Tripped and fell this morning, bruising above RIGHT eye, also fell a few weeks ago, on Coumadin, history chronic systolic heart failure, coronary artery disease, diabetes mellitus, hypertension, ischemic cardiomyopathy, smoker EXAM: CT HEAD WITHOUT CONTRAST CT MAXILLOFACIAL WITHOUT CONTRAST CT CERVICAL SPINE WITHOUT CONTRAST TECHNIQUE: Multidetector CT imaging of the head, cervical spine, and maxillofacial structures were performed using the standard protocol without intravenous contrast. Multiplanar CT image reconstructions of the cervical spine and maxillofacial structures were also generated. Right side of face marked with BB. COMPARISON:  CT head 04/11/2017, 10/28/2010 FINDINGS: CT HEAD FINDINGS Brain: Generalized atrophy. Normal ventricular morphology. No midline shift. Subarachnoid blood identified in the anterior interhemispheric fissure and at multiple sulci in the anterior frontal lobes bilaterally. Additional small amount of subarachnoid blood at the anterior aspect of the RIGHT middle cranial fossa. Small hemorrhagic contusion of the LEFT frontal lobe anteriorly. No evidence of acute  infarction or mass lesion. Vascular: Atherosclerotic calcifications of the internal carotid arteries bilaterally at skull base Skull: Calvaria intact Other: N/A CT MAXILLOFACIAL FINDINGS Osseous: Nasal septum midline. Nasal bones and orbits intact. TMJ alignment normal bilaterally. No  facial bone fractures identified. Small lucent focus within the lateral wall of the RIGHT orbit minimally increased since 2012. Orbits: Intraorbital soft tissue planes clear. Significant RIGHT periorbital contusion/hematoma. Sinuses: Small air-fluid level LEFT sphenoid sinus. Small amount of fluid within a few inferior LEFT mastoid air cells. Remaining paranasal sinuses and mastoid air cells clear. Middle ear cavities clear bilaterally. Partial opacification of LEFT external auditory canal. Soft tissues: RIGHT periorbital contusion/hematoma. Remaining facial soft tissues unremarkable. Atherosclerotic calcifications of the carotid bifurcations bilaterally. CT CERVICAL SPINE FINDINGS Alignment: Normal Skull base and vertebrae: Vertebral body heights maintained. Disc space narrowing C4-C5 and C5-C6 with endplate spur formation. Visualized skull base intact. No fracture, subluxation or bone destruction. Scattered facet degenerative changes. Soft tissues and spinal canal: Prevertebral soft tissues normal thickness. Atherosclerotic calcifications at the carotid bifurcations. Remaining visualized soft tissues unremarkable. Disc levels:  Minimally bulging disc at C4-C5. Upper chest: Lung apices clear Other: LEFT side pacemaker leads noted. IMPRESSION: Subarachnoid hemorrhage at the anterior interhemispheric fissure, at sulci in the anterior frontal lobes bilaterally, and at the anterior aspect of the RIGHT middle cranial fossa. Small hemorrhagic contusion of the LEFT frontal lobe. No definite acute facial bone abnormalities. Small amount of nonspecific fluid in the sphenoid sinus and within the LEFT mastoid air cells. Degenerative disc and  facet disease changes of the cervical spine. No acute cervical spine abnormalities. Critical Value/emergent results were called by telephone at the time of interpretation on 07/14/2017 at 11:46 am to Dr. Gerhard Munch , who verbally acknowledged these results. Electronically Signed   By: Ulyses Southward M.D.   On: 07/23/2017 11:49      Medications:     Scheduled Medications: . sodium chloride   Intravenous Once  . atorvastatin  80 mg Per Tube Daily  . danazol  100 mg Per Tube BID  . ezetimibe  10 mg Per Tube Daily  . hydrALAZINE      . insulin aspart  0-15 Units Subcutaneous Q4H  . morphine      . mupirocin ointment   Nasal BID  . mupirocin ointment      . ondansetron  4 mg Oral Once  . sildenafil  20 mg Per Tube BID  . sucralfate  1 g Per Tube BID  . [START ON 08-20-2017] Vitamin D (Ergocalciferol)  50,000 Units Oral Weekly     Infusions: . sodium chloride    . calcium gluconate    . clevidipine 2 mg/hr (07/08/2017 0646)  . norepinephrine (LEVOPHED) Adult infusion Stopped (07/06/2017 0358)  . propofol (DIPRIVAN) infusion    . sodium chloride    . vasopressin (PITRESSIN) infusion - *FOR SHOCK*       PRN Medications:  Place/Maintain arterial line **AND** sodium chloride, acetaminophen, fentaNYL (SUBLIMAZE) injection, fentaNYL (SUBLIMAZE) injection, ondansetron (ZOFRAN) IV   Patient Profile   [name] is a 62 y.o. 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   Presented to [facility] after a fall and found to have SAH and left frontal contusion on head CT and transferred to [facility] for further evaluation and treatment.   Assessment/Plan:    1. SAH + left frontal hemorrhage - Given 5 mg Vitamin K and 4 units FFPs + Kcentra initially. INR 1.44 this am. - Serial head CTs showed developing midline shift and interventricular hemorrhage. Underwent  emergent craniotomy overnight with Dr Franky Macho  for hematoma evacuation.  - Now intubated and sedated. Management per CCM.  - He now has fixed/dilated pupils and no reflexes but is initiating breaths on vent.  2. Falls - Pt had suction events on HVAD around the time of his 2 main episodes - Will have ICD interrogated for possible VT component.  - Speed turned down to 2660 and will send report to the company for interrogation.  Suspect underfilled LV.  3. Chronic systolic ZOX:[id number] cardiomyopathy. Echo in 3/18 with EF 20-25%. He did not have much effect from 12/17 PCI in terms of symptoms or EF. [employer] ICD. He is now s/p HVAD placement. Lavare cycle is on.  - Mild volume overload by exam.  Cleda Daub and losartan on hold.  - Currently on clevidipine gtt to keep MAP < 85.  - Continue sildenafil for RV failure.  - On warfarin at home with goal INR 2-2.5. Off ASA with GI bleeding.INR 4.0 on admit.  Coumadin on hold with SAH. S/p 4 units FFP + vitamin K 5 mg + Kcentra. INR 1.44 today.  4. CAD: 3 vessel CAD, s/p stent to proximal LAD and PTCA to subtotally occluded large OM1 in 1/18.  - Continue atorvastatin 80 mg daily.  5. PAD: - Stable. Notes with moderate walking on left. Left fem-pop bypass occluded. Most recent dopplers showed patent right fem-pop. - Medical management per Dr. Edilia Bo. No change.  6. Carotid stenosis:  - Followed by [facility].  7. R Pleural effusion:  - Previously had PleurX catheter. Resolved. No change.  8. Pulmonary arterial HTN:  - Continue Revatio 20 mgBID for now. No change.  9. Smoking:  - Active.   10. Upper GI bleeding:  - 7/18, small bowel AVMs. ASA was decreased to 81 daily. Warfarin INR goal is now 2-2.5. He started monthly octreotide injections but had profuse diarrhea with octreotide after 1st injection and after 2nd injection with decreased dose. He is tolerating danazol without problems. 12/18 had more melena in setting of high INR =>due to  vigorous epistaxis versus AVM bleeding. ASA was stopped.  - Hewillstay off ASA 81 mg daily. Continue PPI.  - Continue danazol. - Hold warfarin for SAH. -  Hemoglobin 8.9 11. L1 Burst Fracture:  - Has had ongoing back pain.  12. R great toe osteomyelitis: - S/pR great toe amputation. Stable.    I reviewed the LVAD parameters from today, and compared the results to the patient's prior recorded data.  No programming changes were made.  The LVAD is functioning within specified parameters.  The patient performs LVAD self-test daily.  LVAD interrogation was negative for any significant power changes, alarms or PI events/speed drops.  LVAD equipment check completed and is in good working order.  Back-up equipment present.   LVAD education done on emergency procedures and precautions and reviewed exit site care.  Length of Stay: 1  Alford Highland, NP 07/15/2017, 7:04 AM  VAD Team --- VAD ISSUES ONLY--- Pager [phone] (7am - 7am)  Advanced Heart Failure Team  Pager [phone] (M-F; 7a - 4p)  Please contact [facility] for night-coverage after hours (4p -7a ) and weekends on [email or web address]  Patient seen with NP, agree with the above note.  He is now s/p craniotomy with hematoma evacuation and ventriculostomy.  This morning, he is intubated.  He does not have corneal reflexes, gag reflex, or response to pain.  Pupils are fixed.  He does initiate breaths.    LVAD is stable, no further suction  events.  LDH 179.  Creatinine 1.4 at baseline. He got 4 units FFP, 5 mg vitamin K, and Kcentra yesterday.  INR 1.4 today.   On exam, JVP 10 cm.  Normal HVAD sounds.  No edema.  Neuro exam as described above.   At this point, I am afraid that his prognosis is grim. Lack of reflexes and pupillary response off sedation is very concerning. For now, will observe off sedation. No warfarin, INR < 1.5 which is goal at this point.  Will discuss with family when they return to hospital.   Keep MAP < 85 with  clevidipine gtt.   HVAD parameters stable.  Speed turned down yesterday, no further suction events.  Suspect suction events due to underfilled LV, these may have triggered lightheadedness/presyncope/falls.   Replace K today.   CRITICAL CARE Performed by: Marca Anconaalton Belva Koziel  Total critical care time: 40 minutes  Critical care time was exclusive of separately billable procedures and treating other patients.  Critical care was necessary to treat or prevent imminent or life-threatening deterioration.  Critical care was time spent personally by me on the following activities: development of treatment plan with patient and/or surrogate as well as nursing, discussions with consultants, evaluation of patient's response to treatment, examination of patient, obtaining history from patient or surrogate, ordering and performing treatments and interventions, ordering and review of laboratory studies, ordering and review of radiographic studies, pulse oximetry and re-evaluation of patient's condition.  Marca AnconaDalton Naaman Curro 05/22/17 7:49 AM

## 2017-08-02 NOTE — Transfer of Care (Signed)
Immediate Anesthesia Transfer of Care Note  Patient: Dalton RainwaterLarry D [name]  Procedure(s) Performed: LEFT FRONTAL CRANIECTOMY FOR HEMATOMA EVACUATION (Left Head)  Patient Location: ICU  Anesthesia Type:General  Level of Consciousness: sedated  Airway & Oxygen Therapy: Patient remains intubated per anesthesia plan and Patient placed on Ventilator (see vital sign flow sheet for setting)  Post-op Assessment: Report given to RN and Post -op Vital signs reviewed and stable  Post vital signs: Reviewed and stable  Last Vitals:  Vitals Value Taken Time  BP 97/77 07/05/2017  2:45 PM  Temp    Pulse 47 07/28/2017  2:47 PM  Resp 25 07/13/2017  2:47 PM  SpO2 100 % 07/21/2017  2:47 PM  Vitals shown include unvalidated device data.  Last Pain:  Vitals:   07/19/2017 2145  TempSrc: Oral  PainSc:       Patients Stated Pain Goal: 2 (07/03/2017 2039)  Complications: No apparent anesthesia complications

## 2017-08-02 NOTE — Op Note (Signed)
07/15/2017  5:21 AM  PATIENT:  [name] Freshour  62 y.o. male with heart failure with an Lvad, whom fell causing a left frontal hemorrhage. The hemorrhage is causing midline shift, and has extended into the ventricles  PRE-OPERATIVE DIAGNOSIS:  left frontal intracranial hemorrhage Intraventricular hemorrhage  POST-OPERATIVE DIAGNOSIS:  left frontal intracranial hemorrhage Intraventricular hemorrhage PROCEDURE:  Procedure(s): LEFT FRONTAL CRANIECTOMY FOR HEMATOMA EVACUATION Right frontal ventricular catheter placement SURGEON: Surgeon(s): Coletta Memosabbell, Naif Alabi, MD  ASSISTANTS:none  ANESTHESIA:   general  EBL:  Total I/O In: 2428.6 [I.V.:1700.6; Blood:728] Out: 800 [Urine:500; Blood:300]  BLOOD ADMINISTERED:728 CC PRBC  CELL SAVER GIVEN:none  COUNT:per nursing  DRAINS: Ventriculostomy Drain in the right lateral ventricle   SPECIMEN:  No Specimen  DICTATION: Lynda RainwaterLarry [name] Buonomo was taken to the operating room, intubated, and placed under a general anesthetic without difficulty. He was positioned supine with his head on a horseshoe. His head was shaved, prepped, and then draped in a sterile manner. I made a linear incision in a forehead crease just above the left eye. I placed Raney clips and a Horticulturist, commercialweitlaner retractor. With the skull exposed I drilled a burr hole, then turned a small flap. The intracerebral blood expressed itself immediately. I used suction and bipolar cautery to evacuate what was an extensive hematoma. I did enter the subarachnoid space and did release csf. I was able to achieve hemostasis via irrigation and surgicel.  The brain was pulsatile when I closed. I placed a piece of duragen over the hematoma cavity. I placed a titanium mesh over the skull defect, securing it with screws.  I approximated the scalp with vicryl sutures, and a nylon suture.  I then placed a sterile dressing.  I then moved to the right side of the head and placed a right frontal ventricular catheter.   [name]  Maiorana had his head shaved and then prepared in a sterile manner. I draped his head in a sterile manner. I injected lidocaine into the planned coronal incision in line with the right pupillary line anterior to the tragus. I opened the skin with a 15 blade. I used the hand twist drill to create a burr hole. I opened the dura with the 20 gauge spinal needle. I passed the ventricular catheter into the lateral ventricle. There was brisk flow of spinal fluid. I tunneled the catheter posteriorly and secured it to the skin at the exit. I approximated the scalp edges with nylon sutures. I placed a sterile dressing, then connected the catheter to the drainage system.   PLAN OF CARE: Admit to inpatient   PATIENT DISPOSITION:  PACU - hemodynamically stable.     PLAN OF CARE: Admit to inpatient   PATIENT DISPOSITION:  ICU - intubated and critically ill.   Delay start of Pharmacological VTE agent (>24hrs) due to surgical blood loss or risk of bleeding:  yes

## 2017-08-02 DEATH — deceased

## 2017-08-07 ENCOUNTER — Telehealth (HOSPITAL_COMMUNITY): Payer: Self-pay | Admitting: *Deleted

## 2017-08-07 ENCOUNTER — Other Ambulatory Visit (HOSPITAL_COMMUNITY): Payer: Self-pay

## 2017-08-07 NOTE — Telephone Encounter (Signed)
[facility]  Dr. Shirlee LatchMcLean completed and signed certificate.  Faxed back to [phone].

## 2017-08-15 ENCOUNTER — Ambulatory Visit: Payer: Self-pay

## 2017-08-22 ENCOUNTER — Ambulatory Visit: Payer: BLUE CROSS/BLUE SHIELD | Admitting: Podiatry

## 2017-09-02 NOTE — Death Summary Note (Signed)
  Advanced Heart Failure Death Summary  Death Summary   Patient ID: [name] MRN: [id number], DOB/AGE: 1955/12/27 62 y.o. Admit date: 2017/03/14 D/C date:     08/26/2017   Primary Discharge Diagnoses:  1. SAH + left frontal hemorrhage 2. Falls 3. Chronic systolic heart failure - s/p HVAD 4. CAD 5. PAD 6. Carotid stenosis 7. R pleural effusion 8. PAH 9. Tobacco use 10. Upper GIB 11. L1 burst fracture 12. R great toe osteomyelitis  Hospital Course:  Dalton RainwaterLarry D [name] was a 62 y.o. 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   Pt presented to [facility] via EMS this am after fall. Per pt, he tripped and fell. Pt's primary complaint was a skin tear on his left shin. He also c/o neck and bilateral shoulder pain. Bleeding well controlled per notes. Noted bruising above R eye noted that gradually worsened in ED. Head CT with SAH and left frontal lobe contusion as below. Pertinent labs on admission included INR 4.00, Cr up to 1.74 from baseline of 1.0-2.0, K 3.7, Hgb 10.2. HVAD showed 2 suction events, so speed was turned down.   He received PO vitamin K, 4 units FFPs, and Kcentra to reverse INR.   Serial head CTs throughout the day 7/30 and into 7/31 showed worsening midline shift and interventricular hemorrhage. He stopped moving right side and became very lethargic 7/30 pm. He was taken for emergent craniotomy overnight with Dr Franky Machoabbell. Hematoma was evacuated and a vent drain was placed. He had fixed, dilated pupils following the procedure.   During the day 07/03/2017, he had no cough, gag, or corneal reflexes. No response to painful stimuli. Pupils remained fixed and dilated. He was initiating some of his own breaths. He was not on any sedation.  In discussion with family, decision was made to withdraw care due to poor prognosis with little improvement in  exam and lack of reflexes. He was terminally extubated once all of his family was present.   Every effort was made during this admission to improve the patients clinical picture. Ultimately, the patient passed away from complications associated with intracranial hemorrhage.   Duration of Discharge Encounter: Greater than 35 minutes   Signed, Alford HighlandAshley M Danitza Schoenfeldt, NP 08/25/2017, 10:31 AM

## 2017-09-02 NOTE — Anesthesia Postprocedure Evaluation (Signed)
Anesthesia Post Note  Patient: [name]  Procedure(s) Performed: LEFT FRONTAL CRANIECTOMY FOR HEMATOMA EVACUATION (Left Head)     Patient location during evaluation: SICU Anesthesia Type: General Level of consciousness: sedated Pain management: pain level controlled Vital Signs Assessment: post-procedure vital signs reviewed and stable Respiratory status: patient remains intubated per anesthesia plan Cardiovascular status: stable Postop Assessment: no apparent nausea or vomiting Anesthetic complications: no    Last Vitals:  Vitals:   07/30/2017 2227 07/13/2017 2300  BP:    Pulse:    Resp: (!) 8 (!) 0  Temp:    SpO2:      Last Pain:  Vitals:   Feb 10, 2017 2145  TempSrc: Oral  PainSc:                  Shelton SilvasKevin D Mclain Freer

## 2017-09-02 DEATH — deceased

## 2017-09-05 ENCOUNTER — Encounter (HOSPITAL_COMMUNITY): Payer: Self-pay

## 2017-09-19 ENCOUNTER — Other Ambulatory Visit (HOSPITAL_COMMUNITY): Payer: Self-pay

## 2017-09-19 ENCOUNTER — Encounter (HOSPITAL_COMMUNITY): Payer: Self-pay

## 2017-09-19 ENCOUNTER — Ambulatory Visit: Payer: Self-pay | Admitting: Vascular Surgery

## 2018-04-28 IMAGING — CR DG CHEST 1V PORT
1 series · 1 of 1 positions shown · non-contrast
Comparison: 06/01/2016.

CLINICAL DATA: Shortness of breath.

EXAM:
PORTABLE CHEST 1 VIEW

[AP]
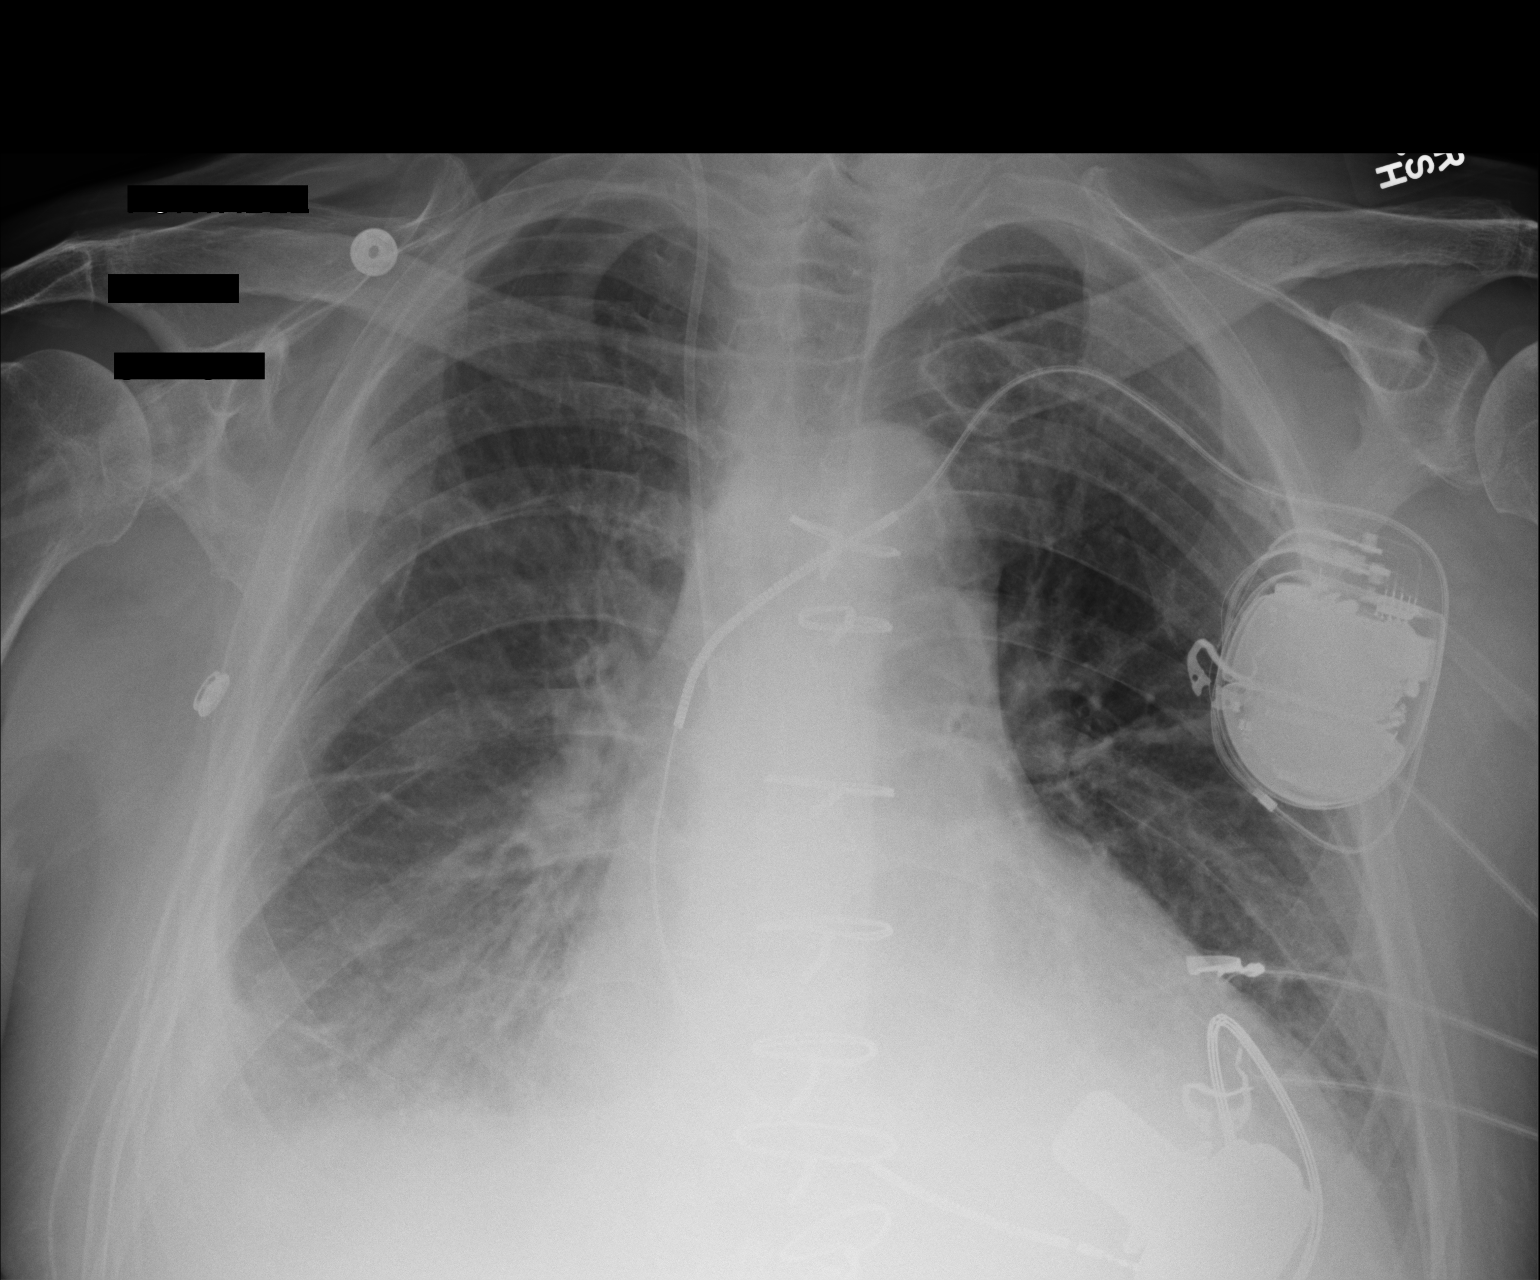

[1 of 1 positions shown; findings below may reference images not displayed]

FINDINGS: Interim removal of right IJ sheath, right IJ line stable position.
Interim removal of right chest tube and mediastinal drainage
catheter. Cardiac pace and r left ventricular assist device in
stable position. R median sternotomy. Stable cardiomegaly. Low lung
volumes with persistent infiltrate/edema in the right lung base.
Small right pleural effusion again noted. No pneumothorax.
IMPRESSION: 1. Interim removal of right IJ sheath, right IJ line stable
position. Interim removal of right chest tube and mediastinal
drainage catheter. No pneumothorax.

2. Cardiac pacer left ventricular assist device in stable position.
Prior median sternotomy. Stable cardiomegaly .

3. Persistent right base infiltrate/edema and small right pleural
effusion. No change.

## 2018-04-29 IMAGING — CR DG CHEST 1V PORT
2 series · 2 of 2 positions shown · non-contrast
Comparison: 06/02/2016

CLINICAL DATA: Followup left ventricular assist device.

EXAM:
PORTABLE CHEST 1 VIEW

[AP (1 of 2)]
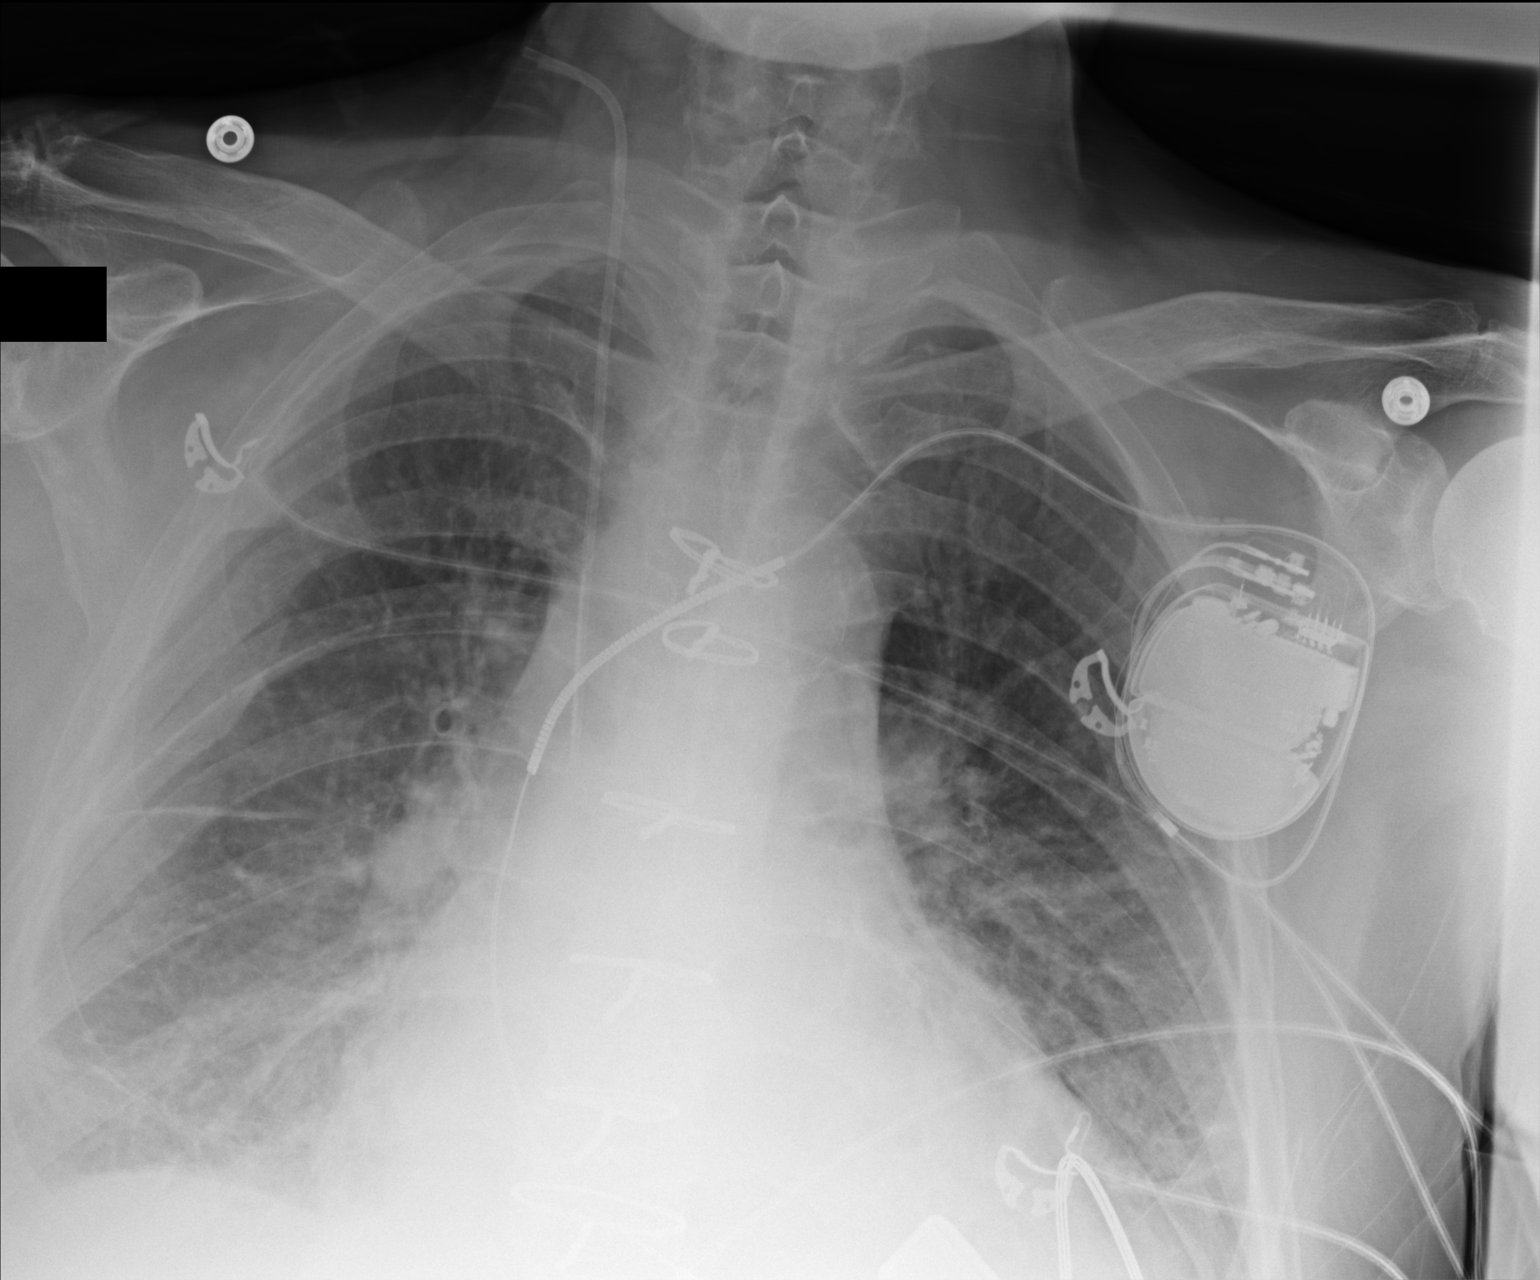

[AP (2 of 2)]
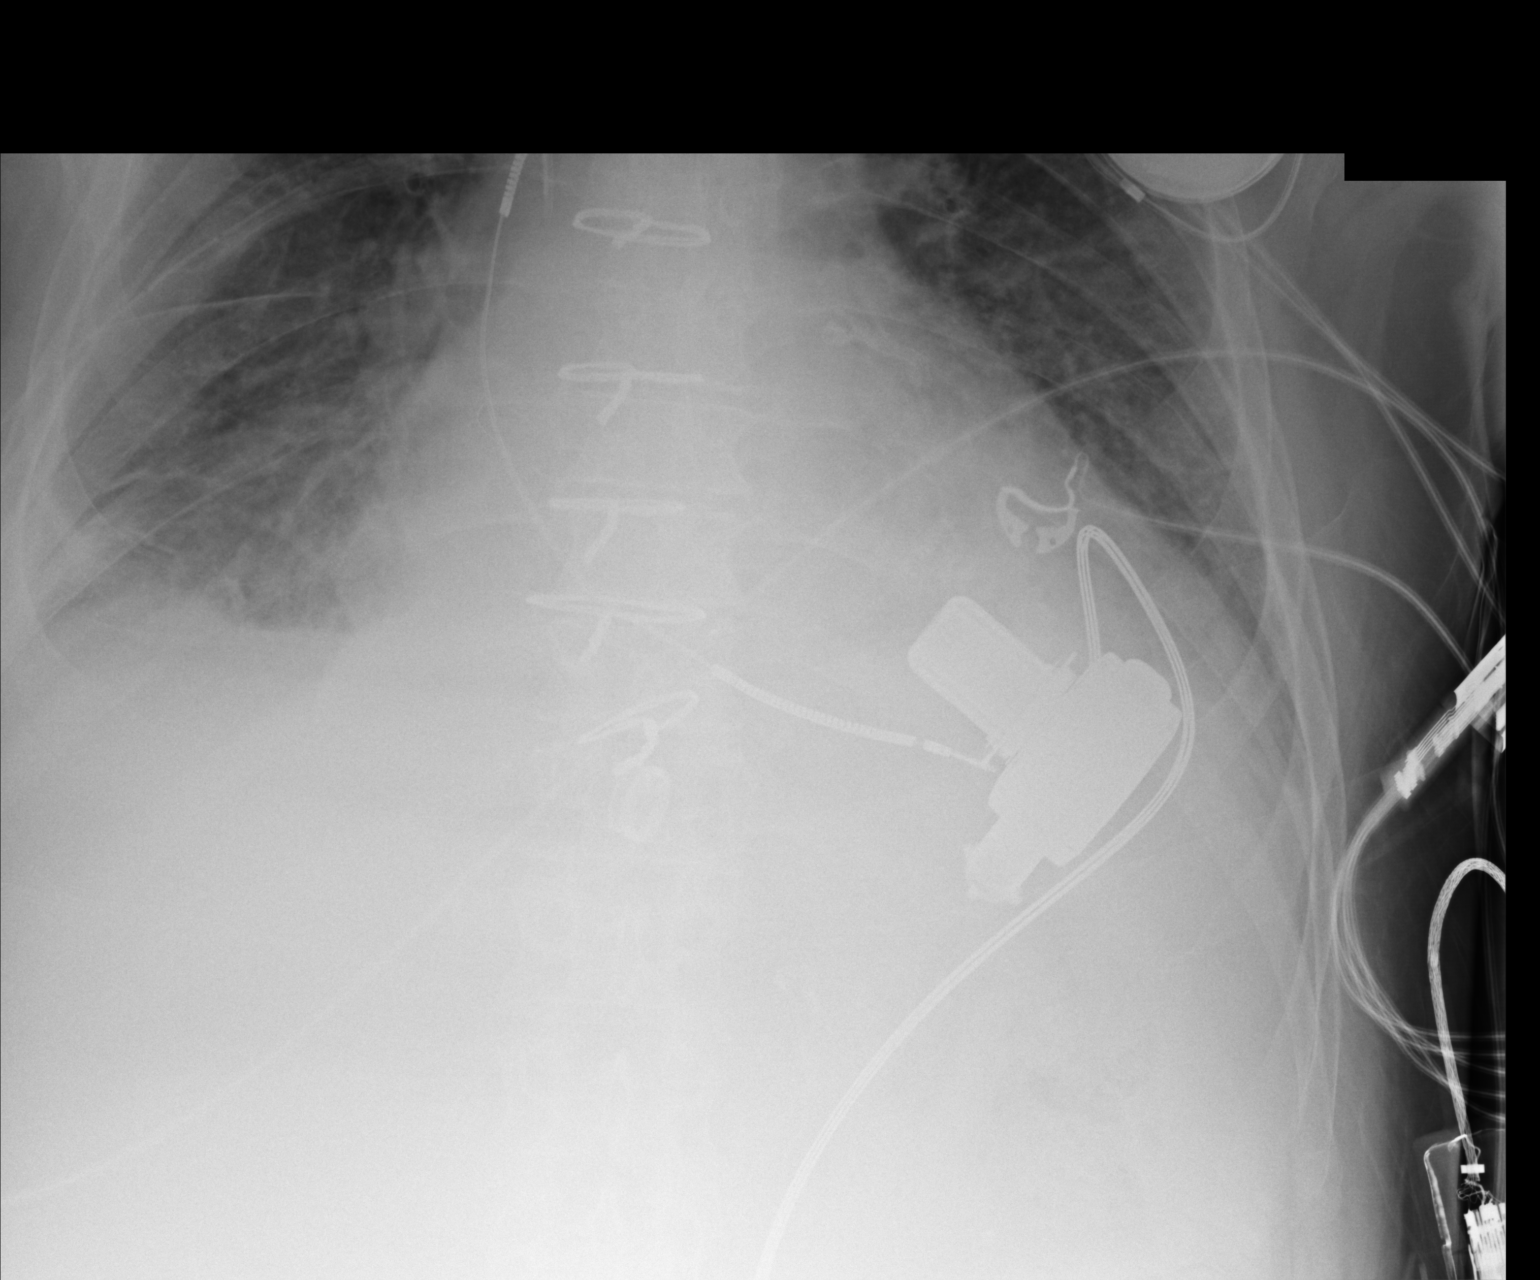

[2 of 2 positions shown; findings below may reference images not displayed]

FINDINGS: Pacemaker/ AICD is unchanged. LVAD device appears the same. Right
internal jugular central line tip is in the SVC 4 cm above the right
atrium. Interstitial and alveolar edema persists cyst with small
effusions. No change since yesterday.
IMPRESSION: No change. Persistent interstitial and alveolar edema with small
effusions. No unexpected finding relative to the LVAD.

## 2018-05-01 IMAGING — DX DG CHEST 1V PORT
1 series · 2 of 2 positions shown · non-contrast
Comparison: 06/03/2016

CLINICAL DATA: Right pleural effusion.

EXAM:
PORTABLE CHEST 1 VIEW

[Series 1: chest · 0.14mm/px · 2 of 2 slices shown]
[im 1/2]
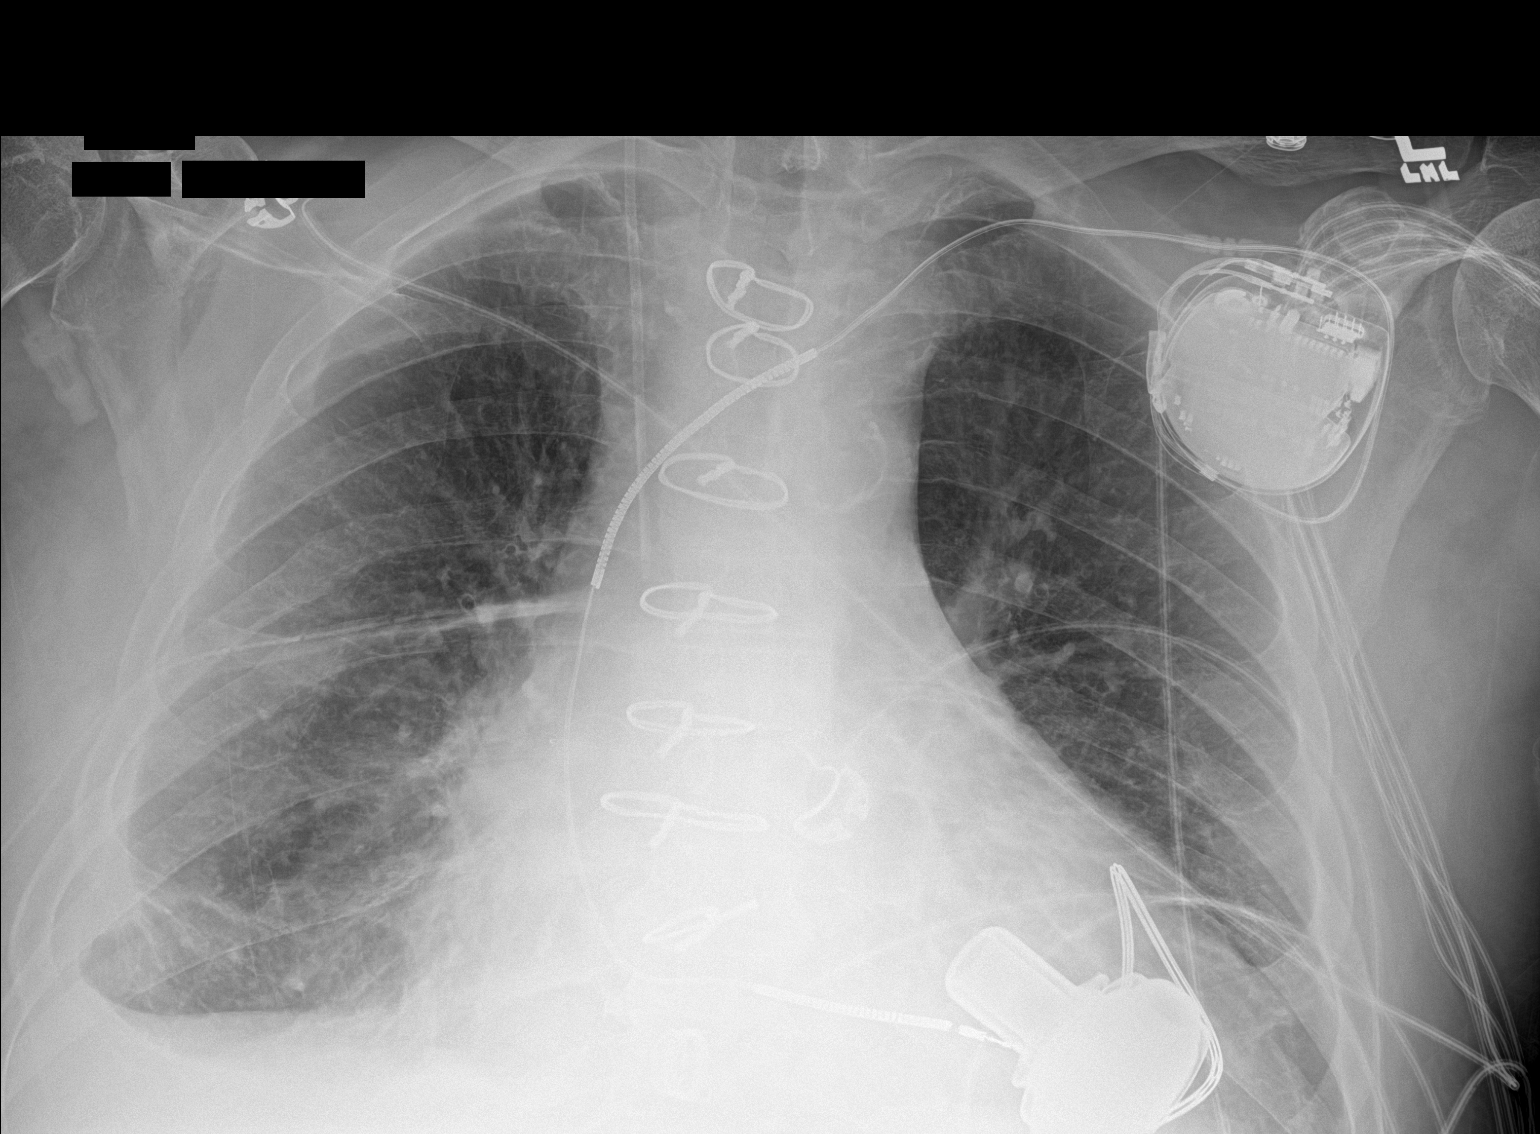
[im 2/2]
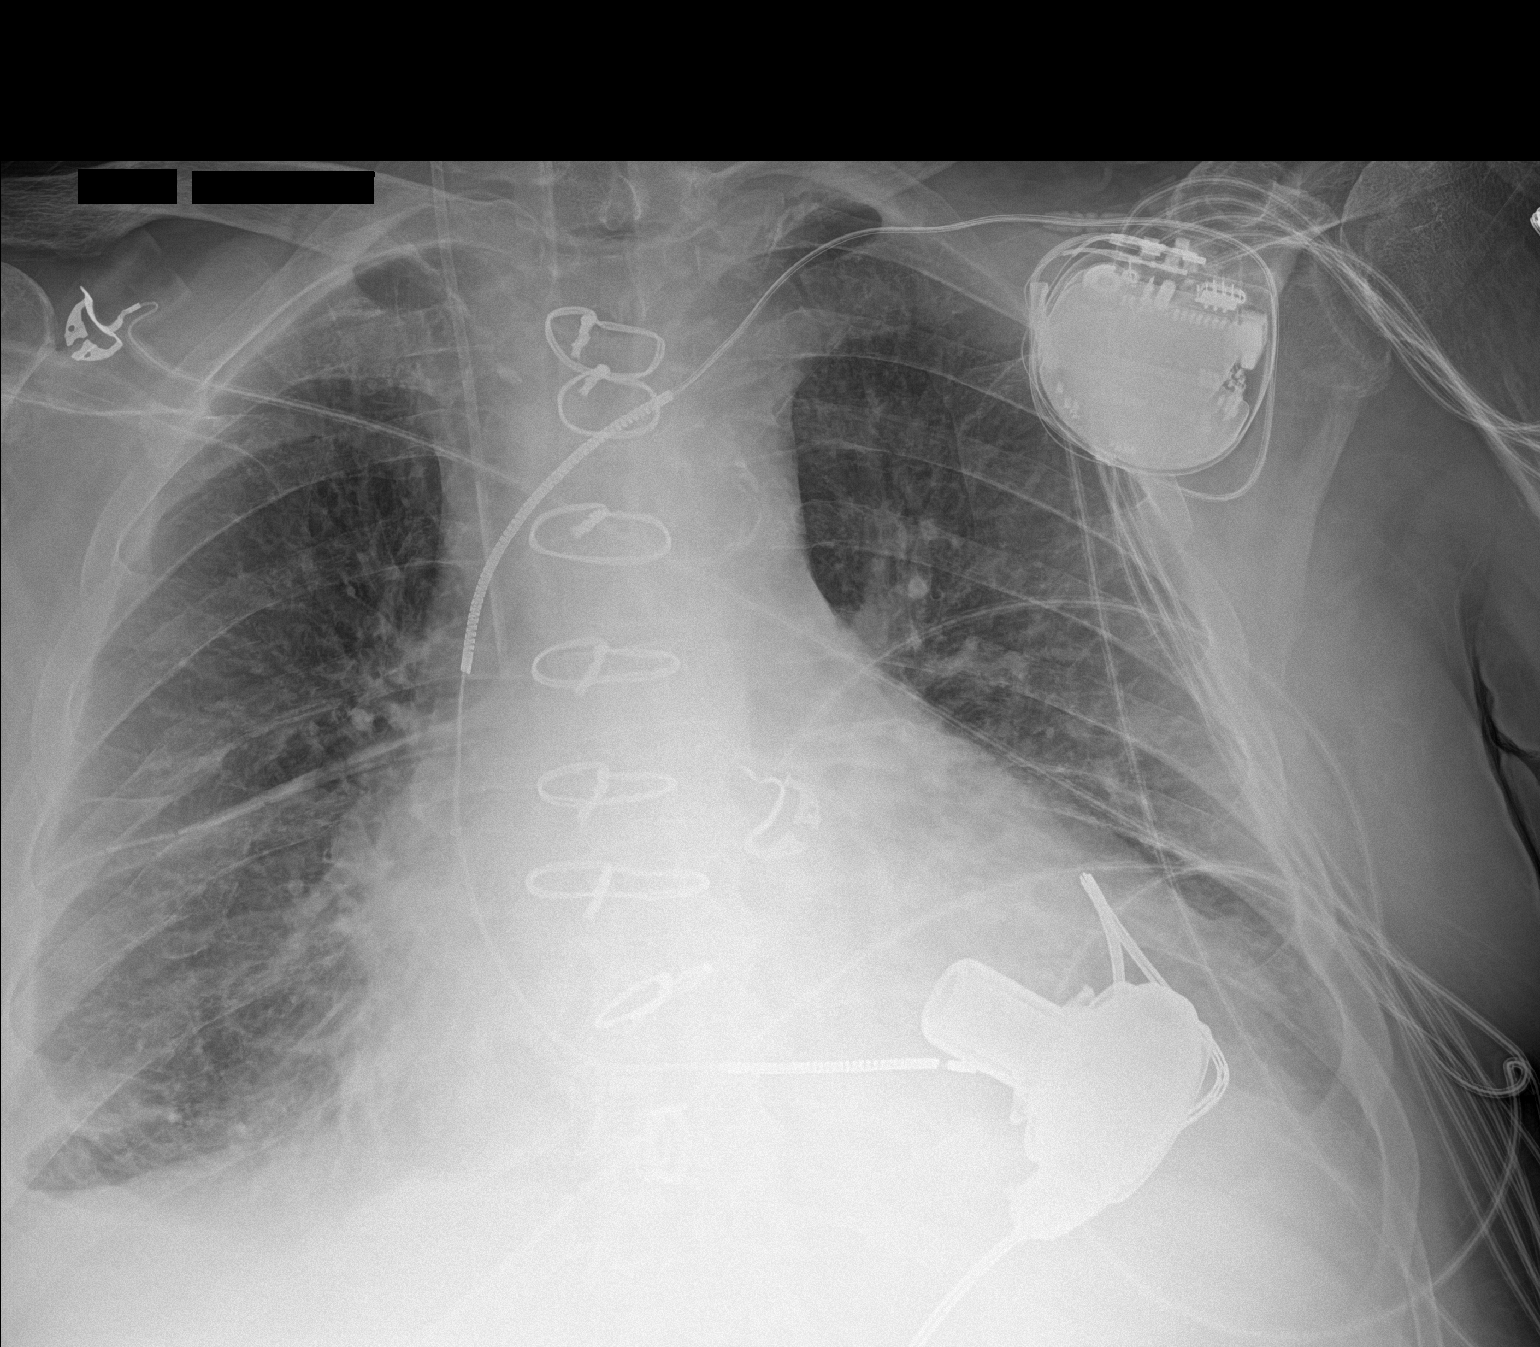

[2 of 2 positions shown; findings below may reference images not displayed]

FINDINGS: The cardio pericardial silhouette is enlarged. Interstitial markings
are diffusely coarsened with chronic features. Small pleural
effusions, right greater than left, no substantial change. The LVAD
again noted. Left pacer/ AICD a remains in place. Right IJ central
line tip overlies the proximal to mid SVC.
IMPRESSION: Cardiomegaly with underlying interstitial changes and small
bilateral effusions, right greater than left.

## 2018-06-11 IMAGING — CT CT CHEST W/ CM
2 of 3 series · 13 of 36 positions shown, 16 images · IV contrast (iopamidol)
Comparison: Chest CT 05/11/2016.

CLINICAL DATA: 60-year-old male with history of left ventricular
assist device. Evaluate pump position and patency of outflow graft.

EXAM:
CT CHEST WITH CONTRAST
TECHNIQUE: Multidetector CT imaging of the chest was performed during
intravenous contrast administration.
CONTRAST:  75mL [id number] IOPAMIDOL ([id number]) INJECTION 61%

[Series 3: chest with 2mm st · axial · 0.94mm/px · z∈[+1188,+1464]mm · 10 of 162 slices shown, 13 images]
[im 12/162  mediastinal]
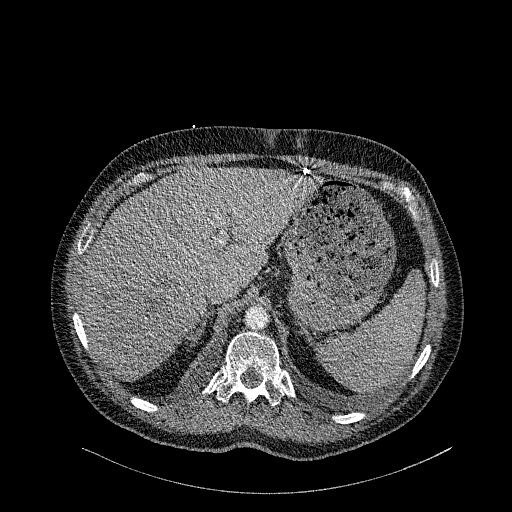
[im 12/162  lung]
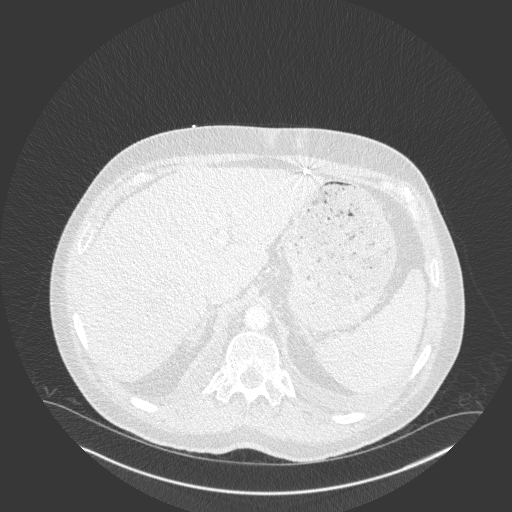
[im 24/162  lung]
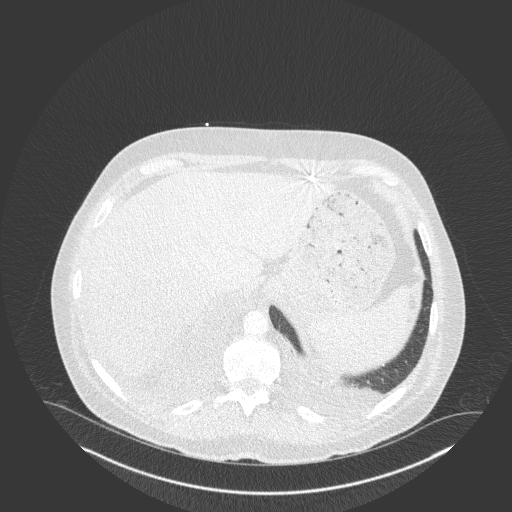
[im 42/162  lung]
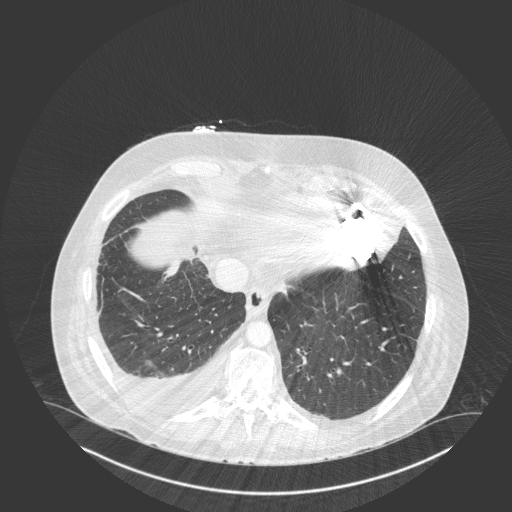
[im 60/162  lung]
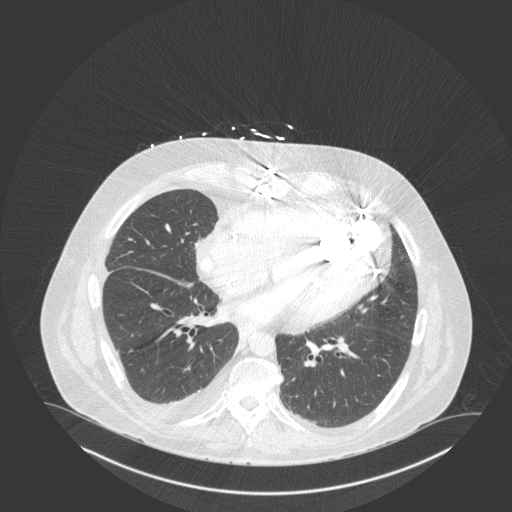
[im 72/162  mediastinal]
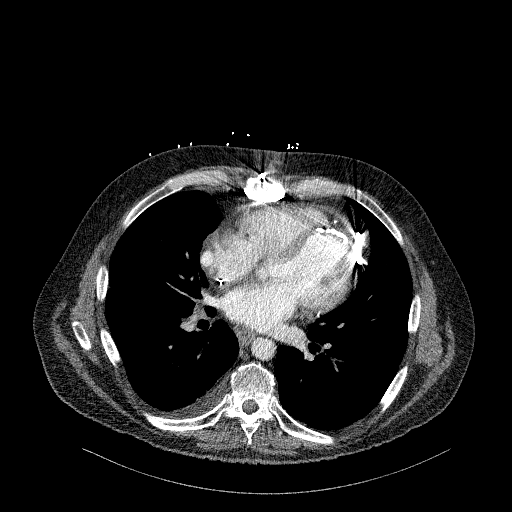
[im 72/162  lung]
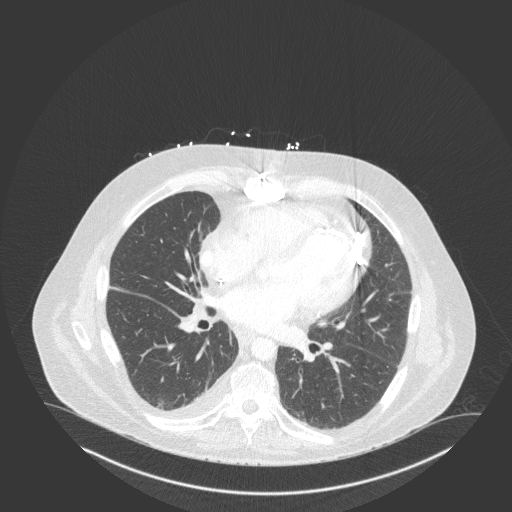
[im 90/162  lung]
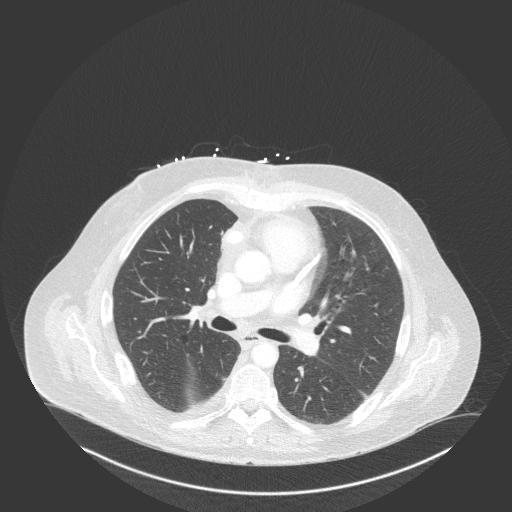
[im 102/162  lung]
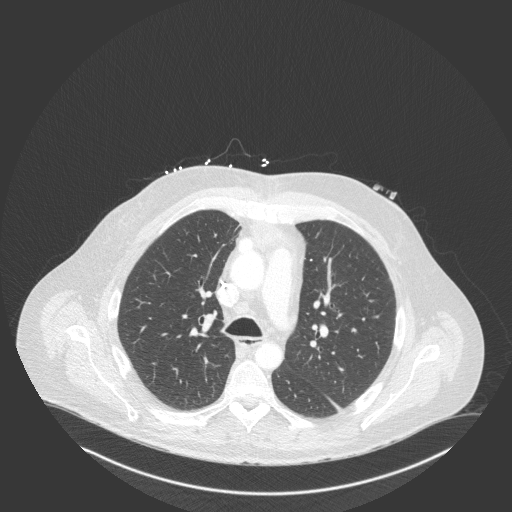
[im 120/162  lung]
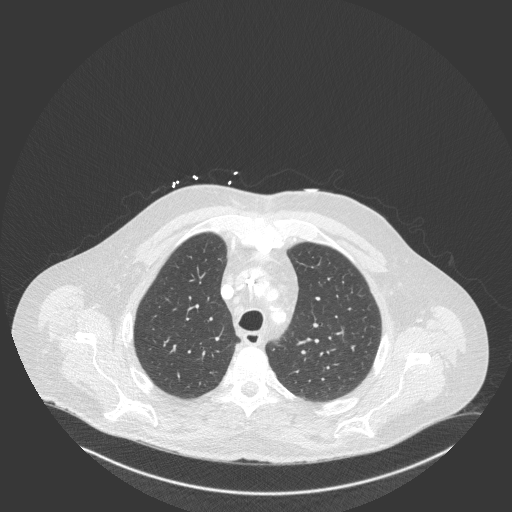
[im 138/162  mediastinal]
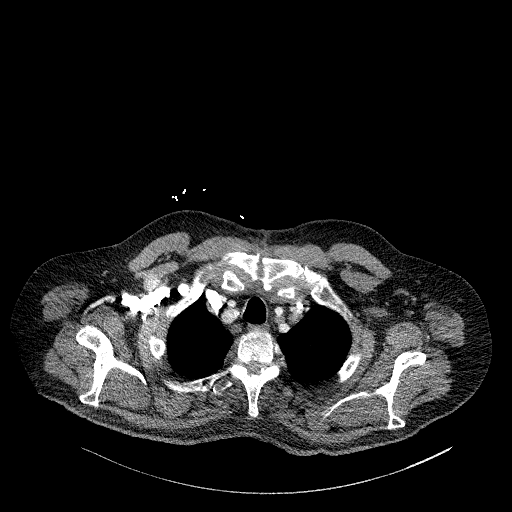
[im 138/162  lung]
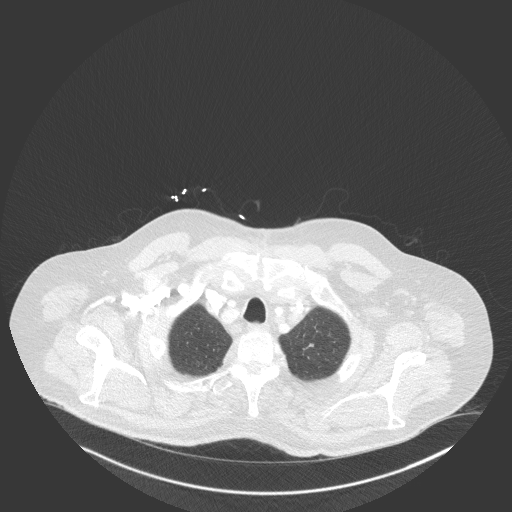
[im 150/162  lung]
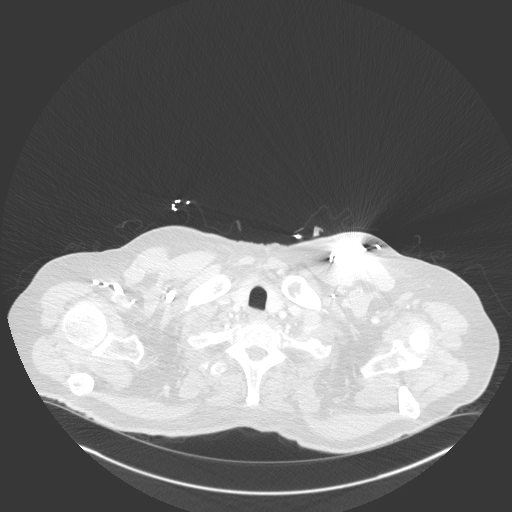

[Series 5: chest with 3mm st cor · coronal · 0.68mm/px · 3 of 124 slices shown]
[im 25/124  lung]
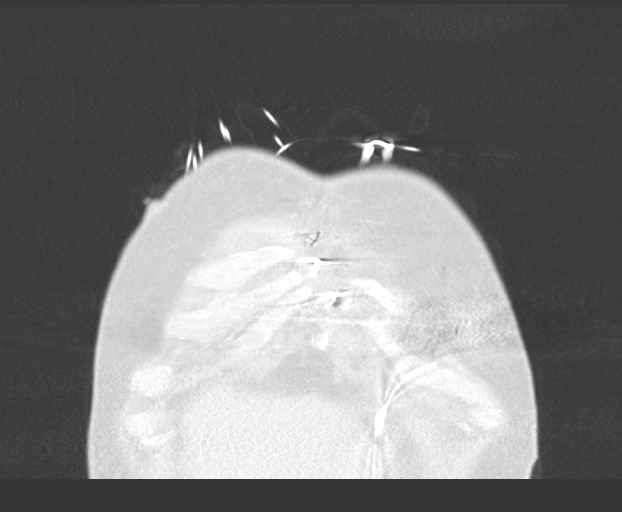
[im 50/124  lung]
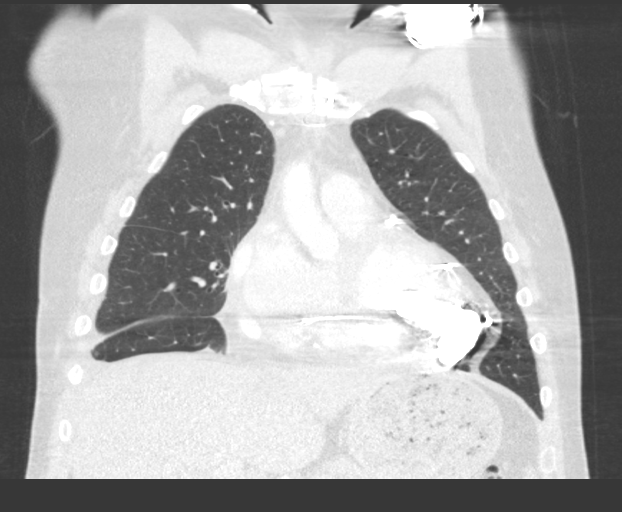
[im 74/124  lung]
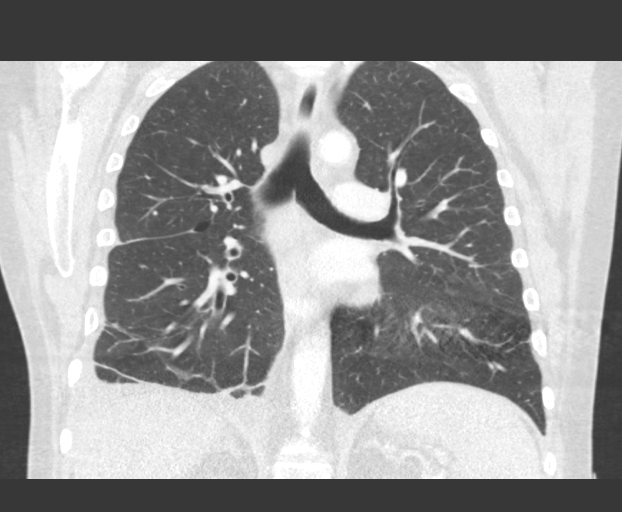

[13 of 36 positions shown; findings below may reference images not displayed]

FINDINGS: LVAD: Left ventricular assist device in place in the apex of the
left ventricle with the inflow cannula slightly directed toward the
interventricular septum, but otherwise with the cannula well aligned
with the long axis of the left ventricle. The outflow cannula is
partially obscured by extensive beam hardening artifact. This is
most evident immediately adjacent to the device, and as the cannula
extends toward the midline. The mid to distal portion of the outflow
cannula is well visualized and is widely patent without evidence of
hemodynamically significant stenosis all the way to the level of the
anastomosis in the distal ascending thoracic aorta. Drive line is
incompletely visualized, but adjacent to the visualized portions of
the drive line there are no unexpected fluid collections. No
unexpected fluid collection adjacent to the left ventricular assist
device or surrounding the left ventricular apex.

Cardiovascular: Heart size is normal. Small amount of pericardial
fluid and/or thickening, unlikely to be of much hemodynamic
significance. No pericardial calcification. Extensive dystrophic
calcifications throughout the apex of the left ventricle, presumably
related to prior low distal LAD territory myocardial infarction.
There is aortic atherosclerosis, as well as atherosclerosis of the
great vessels of the mediastinum and the coronary arteries,
including calcified atherosclerotic plaque in the left anterior
descending, left circumflex and right coronary arteries. Left-sided
pacemaker/ AICD in place with lead tip terminating in the right
ventricular apex.

Mediastinum/Nodes: Several borderline enlarged and mildly enlarged
mediastinal lymph nodes are noted measuring up to 12 mm in short
axis in the subcarinal nodal station. Esophagus is unremarkable in
appearance. No axillary lymphadenopathy.

Lungs/Pleura: Small right and trace left pleural effusions lie
dependently. Some associated passive subsegmental atelectasis in the
dependent portions of the lungs bilaterally. No acute consolidative
airspace disease. No pleural effusions. A few tiny calcified
granulomas are noted throughout the lung bases. No larger more
suspicious appearing pulmonary nodules or masses are noted.

Upper Abdomen: Aortic atherosclerosis.

Musculoskeletal: There are no aggressive appearing lytic or blastic
lesions noted in the visualized portions of the skeleton. Median
sternotomy wires. Healing median sternotomy wound.
IMPRESSION: 1. Expected postoperative appearance of new Heartmate 3 LVAD, as
above. Although the proximal third of the outflow cannula is largely
obscured by beam hardening artifact from the patient's device, the
distal [DATE] of the cannula is widely patent without evidence of
hemodynamically significant stenosis.
2. Aortic atherosclerosis, in addition to 3 vessel coronary artery
disease, with extensive dystrophic calcification throughout the left
ventricular apex, presumably related to prior distal LAD territory
myocardial infarction(s).
3. Small right and trace left pleural effusions lying dependently
with some associated passive subsegmental atelectasis in the item
portions of the lungs bilaterally.
4. Several borderline enlarged and mildly enlarged mediastinal lymph
nodes are nonspecific and likely reactive in the setting of recent
surgery.
5. Additional incidental findings, as above.

Aortic Atherosclerosis ([id number]).
